# Patient Record
Sex: Male | Born: 1990
Health system: Southern US, Community
[De-identification: ages and names within clinical notes are randomized; demographics above are authoritative.]

## PROBLEM LIST (undated history)

## (undated) ENCOUNTER — Emergency Department (HOSPITAL_COMMUNITY): Admission: EM | Discharge status: Absent without leave | Payer: PRIVATE HEALTH INSURANCE

## (undated) DIAGNOSIS — E559 Vitamin D deficiency, unspecified: Secondary | ICD-10-CM

## (undated) DIAGNOSIS — D5700 Hb-SS disease with crisis, unspecified: Secondary | ICD-10-CM

## (undated) DIAGNOSIS — R809 Proteinuria, unspecified: Secondary | ICD-10-CM

## (undated) DIAGNOSIS — Z21 Asymptomatic human immunodeficiency virus [HIV] infection status: Secondary | ICD-10-CM

## (undated) DIAGNOSIS — F419 Anxiety disorder, unspecified: Secondary | ICD-10-CM

## (undated) DIAGNOSIS — D571 Sickle-cell disease without crisis: Secondary | ICD-10-CM

## (undated) DIAGNOSIS — B20 Human immunodeficiency virus [HIV] disease: Secondary | ICD-10-CM

## (undated) HISTORY — DX: Proteinuria, unspecified: R80.9

---
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
## 2009-03-17 ENCOUNTER — Emergency Department (HOSPITAL_COMMUNITY): Admission: EM | Admit: 2009-03-17 | Discharge: 2009-03-17 | Payer: Self-pay | Admitting: Emergency Medicine

## 2009-05-22 ENCOUNTER — Inpatient Hospital Stay (HOSPITAL_COMMUNITY): Admission: EM | Admit: 2009-05-22 | Discharge: 2009-06-07 | Payer: Self-pay | Admitting: Emergency Medicine

## 2010-05-29 DIAGNOSIS — D571 Sickle-cell disease without crisis: Secondary | ICD-10-CM

## 2010-07-10 LAB — CBC
HCT: 18.3 % — ABNORMAL LOW (ref 39.0–52.0)
HCT: 20 % — ABNORMAL LOW (ref 39.0–52.0)
HCT: 20.4 % — ABNORMAL LOW (ref 39.0–52.0)
HCT: 21.8 % — ABNORMAL LOW (ref 39.0–52.0)
HCT: 21.9 % — ABNORMAL LOW (ref 39.0–52.0)
HCT: 22.1 % — ABNORMAL LOW (ref 39.0–52.0)
HCT: 22.1 % — ABNORMAL LOW (ref 39.0–52.0)
HCT: 22.7 % — ABNORMAL LOW (ref 39.0–52.0)
HCT: 22.8 % — ABNORMAL LOW (ref 39.0–52.0)
HCT: 22.8 % — ABNORMAL LOW (ref 39.0–52.0)
HCT: 23.7 % — ABNORMAL LOW (ref 39.0–52.0)
HCT: 24.3 % — ABNORMAL LOW (ref 39.0–52.0)
HCT: 24.4 % — ABNORMAL LOW (ref 39.0–52.0)
HCT: 26.2 % — ABNORMAL LOW (ref 39.0–52.0)
HCT: 30.9 % — ABNORMAL LOW (ref 39.0–52.0)
Hemoglobin: 6.6 g/dL — CL (ref 13.0–17.0)
Hemoglobin: 6.6 g/dL — CL (ref 13.0–17.0)
Hemoglobin: 7.1 g/dL — ABNORMAL LOW (ref 13.0–17.0)
Hemoglobin: 7.1 g/dL — ABNORMAL LOW (ref 13.0–17.0)
Hemoglobin: 7.8 g/dL — ABNORMAL LOW (ref 13.0–17.0)
Hemoglobin: 7.9 g/dL — ABNORMAL LOW (ref 13.0–17.0)
Hemoglobin: 7.9 g/dL — ABNORMAL LOW (ref 13.0–17.0)
Hemoglobin: 7.9 g/dL — ABNORMAL LOW (ref 13.0–17.0)
Hemoglobin: 8.1 g/dL — ABNORMAL LOW (ref 13.0–17.0)
Hemoglobin: 8.1 g/dL — ABNORMAL LOW (ref 13.0–17.0)
Hemoglobin: 8.2 g/dL — ABNORMAL LOW (ref 13.0–17.0)
Hemoglobin: 8.2 g/dL — ABNORMAL LOW (ref 13.0–17.0)
Hemoglobin: 8.6 g/dL — ABNORMAL LOW (ref 13.0–17.0)
Hemoglobin: 8.8 g/dL — ABNORMAL LOW (ref 13.0–17.0)
Hemoglobin: 9.4 g/dL — ABNORMAL LOW (ref 13.0–17.0)
MCHC: 34.7 g/dL (ref 30.0–36.0)
MCHC: 35 g/dL (ref 30.0–36.0)
MCHC: 35.2 g/dL (ref 30.0–36.0)
MCHC: 35.3 g/dL (ref 30.0–36.0)
MCHC: 35.4 g/dL (ref 30.0–36.0)
MCHC: 35.6 g/dL (ref 30.0–36.0)
MCHC: 35.7 g/dL (ref 30.0–36.0)
MCHC: 35.7 g/dL (ref 30.0–36.0)
MCHC: 35.8 g/dL (ref 30.0–36.0)
MCHC: 35.8 g/dL (ref 30.0–36.0)
MCHC: 35.8 g/dL (ref 30.0–36.0)
MCHC: 35.9 g/dL (ref 30.0–36.0)
MCHC: 36 g/dL (ref 30.0–36.0)
MCHC: 36.2 g/dL — ABNORMAL HIGH (ref 30.0–36.0)
MCHC: 36.2 g/dL — ABNORMAL HIGH (ref 30.0–36.0)
MCV: 100.4 fL — ABNORMAL HIGH (ref 78.0–100.0)
MCV: 100.7 fL — ABNORMAL HIGH (ref 78.0–100.0)
MCV: 100.9 fL — ABNORMAL HIGH (ref 78.0–100.0)
MCV: 100.9 fL — ABNORMAL HIGH (ref 78.0–100.0)
MCV: 101.5 fL — ABNORMAL HIGH (ref 78.0–100.0)
MCV: 93.3 fL (ref 78.0–100.0)
MCV: 93.6 fL (ref 78.0–100.0)
MCV: 93.9 fL (ref 78.0–100.0)
MCV: 95.3 fL (ref 78.0–100.0)
MCV: 96.9 fL (ref 78.0–100.0)
MCV: 96.9 fL (ref 78.0–100.0)
MCV: 97.1 fL (ref 78.0–100.0)
MCV: 97.3 fL (ref 78.0–100.0)
MCV: 97.6 fL (ref 78.0–100.0)
MCV: 98.7 fL (ref 78.0–100.0)
Platelets: 295 10*3/uL (ref 150–400)
Platelets: 326 10*3/uL (ref 150–400)
Platelets: 351 10*3/uL (ref 150–400)
Platelets: 382 10*3/uL (ref 150–400)
Platelets: 411 10*3/uL — ABNORMAL HIGH (ref 150–400)
Platelets: 411 10*3/uL — ABNORMAL HIGH (ref 150–400)
Platelets: 426 10*3/uL — ABNORMAL HIGH (ref 150–400)
Platelets: 427 10*3/uL — ABNORMAL HIGH (ref 150–400)
Platelets: 433 10*3/uL — ABNORMAL HIGH (ref 150–400)
Platelets: 433 10*3/uL — ABNORMAL HIGH (ref 150–400)
Platelets: 436 10*3/uL — ABNORMAL HIGH (ref 150–400)
Platelets: 455 10*3/uL — ABNORMAL HIGH (ref 150–400)
Platelets: 460 10*3/uL — ABNORMAL HIGH (ref 150–400)
Platelets: 469 10*3/uL — ABNORMAL HIGH (ref 150–400)
Platelets: 488 10*3/uL — ABNORMAL HIGH (ref 150–400)
RBC: 1.81 MIL/uL — ABNORMAL LOW (ref 4.22–5.81)
RBC: 2.03 MIL/uL — ABNORMAL LOW (ref 4.22–5.81)
RBC: 2.05 MIL/uL — ABNORMAL LOW (ref 4.22–5.81)
RBC: 2.16 MIL/uL — ABNORMAL LOW (ref 4.22–5.81)
RBC: 2.25 MIL/uL — ABNORMAL LOW (ref 4.22–5.81)
RBC: 2.3 MIL/uL — ABNORMAL LOW (ref 4.22–5.81)
RBC: 2.31 MIL/uL — ABNORMAL LOW (ref 4.22–5.81)
RBC: 2.36 MIL/uL — ABNORMAL LOW (ref 4.22–5.81)
RBC: 2.37 MIL/uL — ABNORMAL LOW (ref 4.22–5.81)
RBC: 2.38 MIL/uL — ABNORMAL LOW (ref 4.22–5.81)
RBC: 2.43 MIL/uL — ABNORMAL LOW (ref 4.22–5.81)
RBC: 2.5 MIL/uL — ABNORMAL LOW (ref 4.22–5.81)
RBC: 2.51 MIL/uL — ABNORMAL LOW (ref 4.22–5.81)
RBC: 2.71 MIL/uL — ABNORMAL LOW (ref 4.22–5.81)
RBC: 3.19 MIL/uL — ABNORMAL LOW (ref 4.22–5.81)
RDW: 20.1 % — ABNORMAL HIGH (ref 11.5–15.5)
RDW: 20.2 % — ABNORMAL HIGH (ref 11.5–15.5)
RDW: 20.6 % — ABNORMAL HIGH (ref 11.5–15.5)
RDW: 21.6 % — ABNORMAL HIGH (ref 11.5–15.5)
RDW: 21.9 % — ABNORMAL HIGH (ref 11.5–15.5)
RDW: 22.2 % — ABNORMAL HIGH (ref 11.5–15.5)
RDW: 22.4 % — ABNORMAL HIGH (ref 11.5–15.5)
RDW: 22.9 % — ABNORMAL HIGH (ref 11.5–15.5)
RDW: 23.6 % — ABNORMAL HIGH (ref 11.5–15.5)
RDW: 25.1 % — ABNORMAL HIGH (ref 11.5–15.5)
RDW: 26.1 % — ABNORMAL HIGH (ref 11.5–15.5)
RDW: 27.5 % — ABNORMAL HIGH (ref 11.5–15.5)
RDW: 27.7 % — ABNORMAL HIGH (ref 11.5–15.5)
RDW: 28.5 % — ABNORMAL HIGH (ref 11.5–15.5)
RDW: 29.4 % — ABNORMAL HIGH (ref 11.5–15.5)
WBC: 11.6 10*3/uL — ABNORMAL HIGH (ref 4.0–10.5)
WBC: 12 10*3/uL — ABNORMAL HIGH (ref 4.0–10.5)
WBC: 12.1 10*3/uL — ABNORMAL HIGH (ref 4.0–10.5)
WBC: 12.6 10*3/uL — ABNORMAL HIGH (ref 4.0–10.5)
WBC: 12.8 10*3/uL — ABNORMAL HIGH (ref 4.0–10.5)
WBC: 12.9 10*3/uL — ABNORMAL HIGH (ref 4.0–10.5)
WBC: 13.1 10*3/uL — ABNORMAL HIGH (ref 4.0–10.5)
WBC: 13.4 10*3/uL — ABNORMAL HIGH (ref 4.0–10.5)
WBC: 14.3 10*3/uL — ABNORMAL HIGH (ref 4.0–10.5)
WBC: 15 10*3/uL — ABNORMAL HIGH (ref 4.0–10.5)
WBC: 15.6 10*3/uL — ABNORMAL HIGH (ref 4.0–10.5)
WBC: 16.2 10*3/uL — ABNORMAL HIGH (ref 4.0–10.5)
WBC: 16.3 10*3/uL — ABNORMAL HIGH (ref 4.0–10.5)
WBC: 16.6 10*3/uL — ABNORMAL HIGH (ref 4.0–10.5)
WBC: 17.6 10*3/uL — ABNORMAL HIGH (ref 4.0–10.5)

## 2010-07-10 LAB — TYPE AND SCREEN
ABO/RH(D): O POS
ABO/RH(D): O POS
Antibody Screen: NEGATIVE
Antibody Screen: NEGATIVE

## 2010-07-10 LAB — RETICULOCYTES
RBC.: 2.05 MIL/uL — ABNORMAL LOW (ref 4.22–5.81)
RBC.: 2.05 MIL/uL — ABNORMAL LOW (ref 4.22–5.81)
RBC.: 2.12 MIL/uL — ABNORMAL LOW (ref 4.22–5.81)
RBC.: 2.19 MIL/uL — ABNORMAL LOW (ref 4.22–5.81)
RBC.: 2.41 MIL/uL — ABNORMAL LOW (ref 4.22–5.81)
RBC.: 2.42 MIL/uL — ABNORMAL LOW (ref 4.22–5.81)
Retic Count, Absolute: 180.2 10*3/uL (ref 19.0–186.0)
Retic Count, Absolute: 205.7 10*3/uL — ABNORMAL HIGH (ref 19.0–186.0)
Retic Count, Absolute: 207.1 10*3/uL — ABNORMAL HIGH (ref 19.0–186.0)
Retic Count, Absolute: 231.7 10*3/uL — ABNORMAL HIGH (ref 19.0–186.0)
Retic Count, Absolute: 238.6 10*3/uL — ABNORMAL HIGH (ref 19.0–186.0)
Retic Count, Absolute: 433.6 10*3/uL — ABNORMAL HIGH (ref 19.0–186.0)
Retic Ct Pct: 10.1 % — ABNORMAL HIGH (ref 0.4–3.1)
Retic Ct Pct: 11.3 % — ABNORMAL HIGH (ref 0.4–3.1)
Retic Ct Pct: 19.8 % — ABNORMAL HIGH (ref 0.4–3.1)
Retic Ct Pct: 8.5 % — ABNORMAL HIGH (ref 0.4–3.1)
Retic Ct Pct: 8.5 % — ABNORMAL HIGH (ref 0.4–3.1)
Retic Ct Pct: 9.9 % — ABNORMAL HIGH (ref 0.4–3.1)

## 2010-07-10 LAB — COMPREHENSIVE METABOLIC PANEL
ALT: 11 U/L (ref 0–53)
ALT: 12 U/L (ref 0–53)
ALT: 13 U/L (ref 0–53)
ALT: 14 U/L (ref 0–53)
ALT: 14 U/L (ref 0–53)
ALT: 15 U/L (ref 0–53)
ALT: 15 U/L (ref 0–53)
ALT: 17 U/L (ref 0–53)
ALT: 18 U/L (ref 0–53)
ALT: 21 U/L (ref 0–53)
AST: 31 U/L (ref 0–37)
AST: 31 U/L (ref 0–37)
AST: 33 U/L (ref 0–37)
AST: 34 U/L (ref 0–37)
AST: 35 U/L (ref 0–37)
AST: 40 U/L — ABNORMAL HIGH (ref 0–37)
AST: 42 U/L — ABNORMAL HIGH (ref 0–37)
AST: 42 U/L — ABNORMAL HIGH (ref 0–37)
AST: 42 U/L — ABNORMAL HIGH (ref 0–37)
AST: 48 U/L — ABNORMAL HIGH (ref 0–37)
Albumin: 3.2 g/dL — ABNORMAL LOW (ref 3.5–5.2)
Albumin: 3.5 g/dL (ref 3.5–5.2)
Albumin: 3.5 g/dL (ref 3.5–5.2)
Albumin: 3.5 g/dL (ref 3.5–5.2)
Albumin: 3.6 g/dL (ref 3.5–5.2)
Albumin: 3.6 g/dL (ref 3.5–5.2)
Albumin: 3.7 g/dL (ref 3.5–5.2)
Albumin: 3.8 g/dL (ref 3.5–5.2)
Albumin: 3.8 g/dL (ref 3.5–5.2)
Albumin: 3.9 g/dL (ref 3.5–5.2)
Alkaline Phosphatase: 46 U/L (ref 39–117)
Alkaline Phosphatase: 48 U/L (ref 39–117)
Alkaline Phosphatase: 48 U/L (ref 39–117)
Alkaline Phosphatase: 49 U/L (ref 39–117)
Alkaline Phosphatase: 50 U/L (ref 39–117)
Alkaline Phosphatase: 50 U/L (ref 39–117)
Alkaline Phosphatase: 51 U/L (ref 39–117)
Alkaline Phosphatase: 51 U/L (ref 39–117)
Alkaline Phosphatase: 52 U/L (ref 39–117)
Alkaline Phosphatase: 53 U/L (ref 39–117)
BUN: 10 mg/dL (ref 6–23)
BUN: 10 mg/dL (ref 6–23)
BUN: 12 mg/dL (ref 6–23)
BUN: 12 mg/dL (ref 6–23)
BUN: 15 mg/dL (ref 6–23)
BUN: 4 mg/dL — ABNORMAL LOW (ref 6–23)
BUN: 5 mg/dL — ABNORMAL LOW (ref 6–23)
BUN: 7 mg/dL (ref 6–23)
BUN: 7 mg/dL (ref 6–23)
BUN: 9 mg/dL (ref 6–23)
CO2: 24 mEq/L (ref 19–32)
CO2: 25 mEq/L (ref 19–32)
CO2: 27 mEq/L (ref 19–32)
CO2: 28 mEq/L (ref 19–32)
CO2: 28 mEq/L (ref 19–32)
CO2: 29 mEq/L (ref 19–32)
CO2: 29 mEq/L (ref 19–32)
CO2: 30 mEq/L (ref 19–32)
CO2: 30 mEq/L (ref 19–32)
CO2: 30 mEq/L (ref 19–32)
Calcium: 8.4 mg/dL (ref 8.4–10.5)
Calcium: 8.8 mg/dL (ref 8.4–10.5)
Calcium: 8.9 mg/dL (ref 8.4–10.5)
Calcium: 8.9 mg/dL (ref 8.4–10.5)
Calcium: 8.9 mg/dL (ref 8.4–10.5)
Calcium: 9.2 mg/dL (ref 8.4–10.5)
Calcium: 9.3 mg/dL (ref 8.4–10.5)
Calcium: 9.4 mg/dL (ref 8.4–10.5)
Calcium: 9.6 mg/dL (ref 8.4–10.5)
Calcium: 9.6 mg/dL (ref 8.4–10.5)
Chloride: 101 mEq/L (ref 96–112)
Chloride: 102 mEq/L (ref 96–112)
Chloride: 102 mEq/L (ref 96–112)
Chloride: 103 mEq/L (ref 96–112)
Chloride: 103 mEq/L (ref 96–112)
Chloride: 103 mEq/L (ref 96–112)
Chloride: 104 mEq/L (ref 96–112)
Chloride: 105 mEq/L (ref 96–112)
Chloride: 105 mEq/L (ref 96–112)
Chloride: 99 mEq/L (ref 96–112)
Creatinine, Ser: 0.58 mg/dL (ref 0.4–1.5)
Creatinine, Ser: 0.62 mg/dL (ref 0.4–1.5)
Creatinine, Ser: 0.66 mg/dL (ref 0.4–1.5)
Creatinine, Ser: 0.68 mg/dL (ref 0.4–1.5)
Creatinine, Ser: 0.68 mg/dL (ref 0.4–1.5)
Creatinine, Ser: 0.69 mg/dL (ref 0.4–1.5)
Creatinine, Ser: 0.71 mg/dL (ref 0.4–1.5)
Creatinine, Ser: 0.71 mg/dL (ref 0.4–1.5)
Creatinine, Ser: 0.8 mg/dL (ref 0.4–1.5)
Creatinine, Ser: 0.83 mg/dL (ref 0.4–1.5)
GFR calc Af Amer: >60 mL/min (ref >60)
GFR calc Af Amer: >60 mL/min (ref >60)
GFR calc Af Amer: >60 mL/min (ref >60)
GFR calc Af Amer: >60 mL/min (ref >60)
GFR calc Af Amer: >60 mL/min (ref >60)
GFR calc Af Amer: >60 mL/min (ref >60)
GFR calc Af Amer: >60 mL/min (ref >60)
GFR calc Af Amer: >60 mL/min (ref >60)
GFR calc Af Amer: >60 mL/min (ref >60)
GFR calc Af Amer: >60 mL/min (ref >60)
GFR calc non Af Amer: >60 mL/min (ref >60)
GFR calc non Af Amer: >60 mL/min (ref >60)
GFR calc non Af Amer: >60 mL/min (ref >60)
GFR calc non Af Amer: >60 mL/min (ref >60)
GFR calc non Af Amer: >60 mL/min (ref >60)
GFR calc non Af Amer: >60 mL/min (ref >60)
GFR calc non Af Amer: >60 mL/min (ref >60)
GFR calc non Af Amer: >60 mL/min (ref >60)
GFR calc non Af Amer: >60 mL/min (ref >60)
GFR calc non Af Amer: >60 mL/min (ref >60)
Glucose, Bld: 101 mg/dL — ABNORMAL HIGH (ref 70–99)
Glucose, Bld: 104 mg/dL — ABNORMAL HIGH (ref 70–99)
Glucose, Bld: 104 mg/dL — ABNORMAL HIGH (ref 70–99)
Glucose, Bld: 88 mg/dL (ref 70–99)
Glucose, Bld: 88 mg/dL (ref 70–99)
Glucose, Bld: 91 mg/dL (ref 70–99)
Glucose, Bld: 92 mg/dL (ref 70–99)
Glucose, Bld: 93 mg/dL (ref 70–99)
Glucose, Bld: 93 mg/dL (ref 70–99)
Glucose, Bld: 95 mg/dL (ref 70–99)
Potassium: 3.6 mEq/L (ref 3.5–5.1)
Potassium: 3.9 mEq/L (ref 3.5–5.1)
Potassium: 3.9 mEq/L (ref 3.5–5.1)
Potassium: 3.9 mEq/L (ref 3.5–5.1)
Potassium: 3.9 mEq/L (ref 3.5–5.1)
Potassium: 4 mEq/L (ref 3.5–5.1)
Potassium: 4.1 mEq/L (ref 3.5–5.1)
Potassium: 4.1 mEq/L (ref 3.5–5.1)
Potassium: 4.2 mEq/L (ref 3.5–5.1)
Potassium: 4.5 mEq/L (ref 3.5–5.1)
Sodium: 134 mEq/L — ABNORMAL LOW (ref 135–145)
Sodium: 136 mEq/L (ref 135–145)
Sodium: 136 mEq/L (ref 135–145)
Sodium: 136 mEq/L (ref 135–145)
Sodium: 137 mEq/L (ref 135–145)
Sodium: 137 mEq/L (ref 135–145)
Sodium: 137 mEq/L (ref 135–145)
Sodium: 137 mEq/L (ref 135–145)
Sodium: 138 mEq/L (ref 135–145)
Sodium: 139 mEq/L (ref 135–145)
Total Bilirubin: 4.9 mg/dL — ABNORMAL HIGH (ref 0.3–1.2)
Total Bilirubin: 5 mg/dL — ABNORMAL HIGH (ref 0.3–1.2)
Total Bilirubin: 5.7 mg/dL — ABNORMAL HIGH (ref 0.3–1.2)
Total Bilirubin: 5.9 mg/dL — ABNORMAL HIGH (ref 0.3–1.2)
Total Bilirubin: 6 mg/dL — ABNORMAL HIGH (ref 0.3–1.2)
Total Bilirubin: 6 mg/dL — ABNORMAL HIGH (ref 0.3–1.2)
Total Bilirubin: 6.4 mg/dL — ABNORMAL HIGH (ref 0.3–1.2)
Total Bilirubin: 6.6 mg/dL — ABNORMAL HIGH (ref 0.3–1.2)
Total Bilirubin: 8.8 mg/dL — ABNORMAL HIGH (ref 0.3–1.2)
Total Bilirubin: 9.7 mg/dL — ABNORMAL HIGH (ref 0.3–1.2)
Total Protein: 6.7 g/dL (ref 6.0–8.3)
Total Protein: 7.1 g/dL (ref 6.0–8.3)
Total Protein: 7.2 g/dL (ref 6.0–8.3)
Total Protein: 7.3 g/dL (ref 6.0–8.3)
Total Protein: 7.6 g/dL (ref 6.0–8.3)
Total Protein: 7.7 g/dL (ref 6.0–8.3)
Total Protein: 7.8 g/dL (ref 6.0–8.3)
Total Protein: 8.1 g/dL (ref 6.0–8.3)
Total Protein: 8.1 g/dL (ref 6.0–8.3)
Total Protein: 8.2 g/dL (ref 6.0–8.3)

## 2010-07-10 LAB — DIFFERENTIAL
Basophils Absolute: 0.1 10*3/uL (ref 0.0–0.1)
Basophils Absolute: 0.2 10*3/uL — ABNORMAL HIGH (ref 0.0–0.1)
Basophils Absolute: 0.3 10*3/uL — ABNORMAL HIGH (ref 0.0–0.1)
Basophils Relative: 1 % (ref 0–1)
Basophils Relative: 1 % (ref 0–1)
Basophils Relative: 2 % — ABNORMAL HIGH (ref 0–1)
Eosinophils Absolute: 0 10*3/uL (ref 0.0–0.7)
Eosinophils Absolute: 0.3 10*3/uL (ref 0.0–0.7)
Eosinophils Absolute: 0.4 10*3/uL (ref 0.0–0.7)
Eosinophils Relative: 0 % (ref 0–5)
Eosinophils Relative: 2 % (ref 0–5)
Eosinophils Relative: 3 % (ref 0–5)
Lymphocytes Relative: 42 % (ref 12–46)
Lymphocytes Relative: 44 % (ref 12–46)
Lymphocytes Relative: 45 % (ref 12–46)
Lymphs Abs: 5.7 10*3/uL — ABNORMAL HIGH (ref 0.7–4.0)
Lymphs Abs: 7.4 10*3/uL — ABNORMAL HIGH (ref 0.7–4.0)
Lymphs Abs: 7.4 10*3/uL — ABNORMAL HIGH (ref 0.7–4.0)
Monocytes Absolute: 1.9 10*3/uL — ABNORMAL HIGH (ref 0.1–1.0)
Monocytes Absolute: 1.9 10*3/uL — ABNORMAL HIGH (ref 0.1–1.0)
Monocytes Absolute: 1.9 10*3/uL — ABNORMAL HIGH (ref 0.1–1.0)
Monocytes Relative: 11 % (ref 3–12)
Monocytes Relative: 12 % (ref 3–12)
Monocytes Relative: 15 % — ABNORMAL HIGH (ref 3–12)
Neutro Abs: 4.7 10*3/uL (ref 1.7–7.7)
Neutro Abs: 6.3 10*3/uL (ref 1.7–7.7)
Neutro Abs: 8.1 10*3/uL — ABNORMAL HIGH (ref 1.7–7.7)
Neutrophils Relative %: 37 % — ABNORMAL LOW (ref 43–77)
Neutrophils Relative %: 39 % — ABNORMAL LOW (ref 43–77)
Neutrophils Relative %: 46 % (ref 43–77)

## 2010-07-10 LAB — CROSSMATCH
ABO/RH(D): O POS
Antibody Screen: NEGATIVE

## 2010-07-10 LAB — PATHOLOGIST SMEAR REVIEW

## 2010-07-10 LAB — HEMOGLOBIN
Hemoglobin: 7.4 g/dL — ABNORMAL LOW (ref 13.0–17.0)
Hemoglobin: 7.7 g/dL — ABNORMAL LOW (ref 13.0–17.0)

## 2010-07-10 LAB — HEPATIC FUNCTION PANEL
ALT: 17 U/L (ref 0–53)
ALT: 23 U/L (ref 0–53)
ALT: 26 U/L (ref 0–53)
AST: 42 U/L — ABNORMAL HIGH (ref 0–37)
AST: 45 U/L — ABNORMAL HIGH (ref 0–37)
AST: 48 U/L — ABNORMAL HIGH (ref 0–37)
Albumin: 3.4 g/dL — ABNORMAL LOW (ref 3.5–5.2)
Albumin: 3.6 g/dL (ref 3.5–5.2)
Albumin: 3.6 g/dL (ref 3.5–5.2)
Alkaline Phosphatase: 43 U/L (ref 39–117)
Alkaline Phosphatase: 51 U/L (ref 39–117)
Alkaline Phosphatase: 52 U/L (ref 39–117)
Bilirubin, Direct: 0.2 mg/dL (ref 0.0–0.3)
Bilirubin, Direct: 0.3 mg/dL (ref 0.0–0.3)
Bilirubin, Direct: 0.3 mg/dL (ref 0.0–0.3)
Indirect Bilirubin: 4.3 mg/dL — ABNORMAL HIGH (ref 0.3–0.9)
Indirect Bilirubin: 5 mg/dL — ABNORMAL HIGH (ref 0.3–0.9)
Indirect Bilirubin: 6.2 mg/dL — ABNORMAL HIGH (ref 0.3–0.9)
Total Bilirubin: 4.5 mg/dL — ABNORMAL HIGH (ref 0.3–1.2)
Total Bilirubin: 5.3 mg/dL — ABNORMAL HIGH (ref 0.3–1.2)
Total Bilirubin: 6.5 mg/dL — ABNORMAL HIGH (ref 0.3–1.2)
Total Protein: 7.6 g/dL (ref 6.0–8.3)
Total Protein: 7.8 g/dL (ref 6.0–8.3)
Total Protein: 7.8 g/dL (ref 6.0–8.3)

## 2010-07-10 LAB — HEPATITIS PANEL, ACUTE
HCV Ab: NEGATIVE
Hep A IgM: NEGATIVE
Hep B C IgM: NEGATIVE
Hepatitis B Surface Ag: NEGATIVE

## 2010-07-10 LAB — BASIC METABOLIC PANEL
BUN: 5 mg/dL — ABNORMAL LOW (ref 6–23)
CO2: 28 mEq/L (ref 19–32)
Calcium: 8.6 mg/dL (ref 8.4–10.5)
Chloride: 106 mEq/L (ref 96–112)
Creatinine, Ser: 0.64 mg/dL (ref 0.4–1.5)
GFR calc Af Amer: >60 mL/min (ref >60)
GFR calc non Af Amer: >60 mL/min (ref >60)
Glucose, Bld: 90 mg/dL (ref 70–99)
Potassium: 4 mEq/L (ref 3.5–5.1)
Sodium: 139 mEq/L (ref 135–145)

## 2010-07-10 LAB — URINALYSIS, ROUTINE W REFLEX MICROSCOPIC
Glucose, UA: NEGATIVE mg/dL
Hgb urine dipstick: NEGATIVE
Ketones, ur: NEGATIVE mg/dL
Nitrite: NEGATIVE
Protein, ur: NEGATIVE mg/dL
Specific Gravity, Urine: 1.013 (ref 1.005–1.030)
Urobilinogen, UA: 4 mg/dL — ABNORMAL HIGH (ref 0.0–1.0)
pH: 5.5 (ref 5.0–8.0)

## 2010-07-10 LAB — URINE CULTURE
Colony Count: NO GROWTH
Culture: NO GROWTH

## 2010-07-10 LAB — IRON: Iron: 145 ug/dL — ABNORMAL HIGH (ref 42–135)

## 2010-07-10 LAB — BILIRUBIN, FRACTIONATED(TOT/DIR/INDIR)
Bilirubin, Direct: 0.4 mg/dL — ABNORMAL HIGH (ref 0.0–0.3)
Indirect Bilirubin: 8.2 mg/dL — ABNORMAL HIGH (ref 0.3–0.9)
Total Bilirubin: 8.6 mg/dL — ABNORMAL HIGH (ref 0.3–1.2)

## 2010-07-10 LAB — RPR: RPR Ser Ql: NONREACTIVE

## 2010-07-10 LAB — URINE MICROSCOPIC-ADD ON

## 2010-07-10 LAB — GLUCOSE, CAPILLARY
Glucose-Capillary: 91 mg/dL (ref 70–99)
Glucose-Capillary: 93 mg/dL (ref 70–99)

## 2010-07-10 LAB — PREPARE RBC (CROSSMATCH)

## 2010-07-10 LAB — ABO/RH: ABO/RH(D): O POS

## 2010-07-10 LAB — FOLATE RBC: RBC Folate: 704 ng/mL — ABNORMAL HIGH (ref 180–600)

## 2010-07-10 LAB — HAPTOGLOBIN: Haptoglobin: <6 mg/dL — ABNORMAL LOW (ref 16–200)

## 2010-07-10 LAB — HIV ANTIBODY (ROUTINE TESTING W REFLEX): HIV: NONREACTIVE

## 2010-07-10 LAB — HEMOGLOBIN A1C: Hgb A1c MFr Bld: <4 % — ABNORMAL LOW (ref 4.6–6.1)

## 2010-07-10 LAB — VITAMIN B12: Vitamin B-12: 331 pg/mL (ref 211–911)

## 2010-07-10 LAB — LACTATE DEHYDROGENASE: LDH: 645 U/L — ABNORMAL HIGH (ref 94–250)

## 2012-05-23 DIAGNOSIS — Z21 Asymptomatic human immunodeficiency virus [HIV] infection status: Secondary | ICD-10-CM | POA: Diagnosis present

## 2012-05-23 DIAGNOSIS — B20 Human immunodeficiency virus [HIV] disease: Secondary | ICD-10-CM | POA: Diagnosis present

## 2012-11-07 DIAGNOSIS — F1290 Cannabis use, unspecified, uncomplicated: Secondary | ICD-10-CM | POA: Insufficient documentation

## 2014-05-19 DIAGNOSIS — K219 Gastro-esophageal reflux disease without esophagitis: Secondary | ICD-10-CM | POA: Diagnosis present

## 2014-05-19 DIAGNOSIS — F411 Generalized anxiety disorder: Secondary | ICD-10-CM | POA: Diagnosis present

## 2014-09-27 DIAGNOSIS — Z79899 Other long term (current) drug therapy: Secondary | ICD-10-CM | POA: Insufficient documentation

## 2016-02-12 DIAGNOSIS — D5700 Hb-SS disease with crisis, unspecified: Secondary | ICD-10-CM | POA: Diagnosis present

## 2016-08-14 DIAGNOSIS — E559 Vitamin D deficiency, unspecified: Secondary | ICD-10-CM | POA: Insufficient documentation

## 2016-12-16 ENCOUNTER — Emergency Department (HOSPITAL_COMMUNITY)
Admission: EM | Admit: 2016-12-16 | Discharge: 2016-12-16 | Disposition: A | Discharge status: Home / Self Care | Payer: Medicaid - Out of State | Attending: Emergency Medicine | Admitting: Emergency Medicine

## 2016-12-16 ENCOUNTER — Encounter (HOSPITAL_COMMUNITY): Payer: Self-pay | Admitting: Emergency Medicine

## 2016-12-16 DIAGNOSIS — D5700 Hb-SS disease with crisis, unspecified: Secondary | ICD-10-CM | POA: Diagnosis not present

## 2016-12-16 DIAGNOSIS — R52 Pain, unspecified: Secondary | ICD-10-CM | POA: Diagnosis present

## 2016-12-16 HISTORY — DX: Hb-SS disease with crisis, unspecified: D57.00

## 2016-12-16 LAB — COMPREHENSIVE METABOLIC PANEL
ALT: 73 U/L — ABNORMAL HIGH (ref 17–63)
AST: 70 U/L — ABNORMAL HIGH (ref 15–41)
Albumin: 3.7 g/dL (ref 3.5–5.0)
Alkaline Phosphatase: 58 U/L (ref 38–126)
Anion gap: 8 (ref 5–15)
BUN: 6 mg/dL (ref 6–20)
CO2: 22 mmol/L (ref 22–32)
Calcium: 8.7 mg/dL — ABNORMAL LOW (ref 8.9–10.3)
Chloride: 106 mmol/L (ref 101–111)
Creatinine, Ser: 0.78 mg/dL (ref 0.61–1.24)
GFR calc Af Amer: >60 mL/min (ref >60)
GFR calc non Af Amer: >60 mL/min (ref >60)
Glucose, Bld: 95 mg/dL (ref 65–99)
Potassium: 4 mmol/L (ref 3.5–5.1)
Sodium: 136 mmol/L (ref 135–145)
Total Bilirubin: 7.8 mg/dL — ABNORMAL HIGH (ref 0.3–1.2)
Total Protein: 8.2 g/dL — ABNORMAL HIGH (ref 6.5–8.1)

## 2016-12-16 LAB — RETICULOCYTES
RBC.: 2.19 MIL/uL — ABNORMAL LOW (ref 4.22–5.81)
Retic Count, Absolute: 350.4 10*3/uL — ABNORMAL HIGH (ref 19.0–186.0)
Retic Ct Pct: 16 % — ABNORMAL HIGH (ref 0.4–3.1)

## 2016-12-16 LAB — CBC WITH DIFFERENTIAL/PLATELET
Basophils Absolute: 0 10*3/uL (ref 0.0–0.1)
Basophils Relative: 0 %
Eosinophils Absolute: 0 10*3/uL (ref 0.0–0.7)
Eosinophils Relative: 0 %
HCT: 26 % — ABNORMAL LOW (ref 39.0–52.0)
Hemoglobin: 9.3 g/dL — ABNORMAL LOW (ref 13.0–17.0)
Lymphocytes Relative: 52 %
Lymphs Abs: 4.2 10*3/uL — ABNORMAL HIGH (ref 0.7–4.0)
MCH: 42.5 pg — ABNORMAL HIGH (ref 26.0–34.0)
MCHC: 35.8 g/dL (ref 30.0–36.0)
MCV: 118.7 fL — ABNORMAL HIGH (ref 78.0–100.0)
Monocytes Absolute: 0.7 10*3/uL (ref 0.1–1.0)
Monocytes Relative: 8 %
Neutro Abs: 3.3 10*3/uL (ref 1.7–7.7)
Neutrophils Relative %: 40 %
Platelets: 262 10*3/uL (ref 150–400)
RBC: 2.19 MIL/uL — ABNORMAL LOW (ref 4.22–5.81)
RDW: 22.2 % — ABNORMAL HIGH (ref 11.5–15.5)
WBC: 8.2 10*3/uL (ref 4.0–10.5)

## 2016-12-16 MED ORDER — DEXTROSE-NACL 5-0.45 % IV SOLN
INTRAVENOUS | Status: DC
Start: 2016-12-16 — End: 2016-12-16
  Administered 2016-12-16: 04:00:00 via INTRAVENOUS

## 2016-12-16 MED ORDER — HYDROMORPHONE HCL 1 MG/ML IJ SOLN
0.5000 mg | Freq: Once | INTRAMUSCULAR | Status: AC
  Administered 2016-12-16: 0.5 mg via SUBCUTANEOUS
  Filled 2016-12-16: qty 1

## 2016-12-16 MED ORDER — MORPHINE SULFATE (PF) 4 MG/ML IV SOLN
6.0000 mg | INTRAVENOUS | Status: DC | PRN
  Administered 2016-12-16 (×2): 6 mg via INTRAVENOUS
  Filled 2016-12-16 (×2): qty 2

## 2016-12-16 MED ORDER — DIPHENHYDRAMINE HCL 25 MG PO CAPS
50.0000 mg | ORAL_CAPSULE | Freq: Once | ORAL | Status: AC
  Administered 2016-12-16: 50 mg via ORAL
  Filled 2016-12-16: qty 2

## 2016-12-16 NOTE — ED Provider Notes (Signed)
[facility]-EMERGENCY DEPT Provider Note   CSN: [id number] Arrival date & time: 12/16/16  0039     History   Chief Complaint Chief Complaint  Patient presents with  . Sickle Cell Pain Crisis    HPI Martin [name] is a 26 y.o. male with a past medical history of sickle cell disease presenting today with diffuse body pain. Patient states this started this evening. He has been compliant with his home oxycodone. He denies any fevers, or cough. He normally goes to [facility] in [place], but he is here visiting family.  He states he normally receives Dilaudid and Benadryl to relieve his pain. He is allergic to Toradol. There are no further complaints.  10 Systems reviewed and are negative for acute change except as noted in the HPI.   HPI  Past Medical History:  Diagnosis Date  . Sickle cell crisis (HCC)     There are no active problems to display for this patient.   History reviewed. No pertinent surgical history.     Home Medications    Prior to Admission medications   Not on File    Family History No family history on file.  Social History Social History  Substance Use Topics  . Smoking status: Never Smoker  . Smokeless tobacco: Never Used  . Alcohol use No     Allergies   Patient has no known allergies.   Review of Systems Review of Systems   Physical Exam Updated Vital Signs BP 102/70 (BP Location: Right Arm)   Pulse 85   Temp 98.6 F (37 C) (Oral)   Resp 16   SpO2 96%   Physical Exam  Constitutional: He is oriented to person, place, and time. Vital signs are normal. He appears well-developed and well-nourished.  Non-toxic appearance. He does not appear ill. No distress.  HENT:  Head: Normocephalic and atraumatic.  Nose: Nose normal.  Mouth/Throat: Oropharynx is clear and moist. No oropharyngeal exudate.  Eyes: Pupils are equal, round, and reactive to light. Conjunctivae and EOM are normal. No scleral icterus.  Neck: Normal range of  motion. Neck supple. No tracheal deviation, no edema, no erythema and normal range of motion present. No thyroid mass and no thyromegaly present.  Cardiovascular: Normal rate, regular rhythm, S1 normal, S2 normal, normal heart sounds, intact distal pulses and normal pulses.  Exam reveals no gallop and no friction rub.   No murmur heard. Pulmonary/Chest: Effort normal and breath sounds normal. No respiratory distress. He has no wheezes. He has no rhonchi. He has no rales.  Abdominal: Soft. Normal appearance and bowel sounds are normal. He exhibits no distension, no ascites and no mass. There is no hepatosplenomegaly. There is no tenderness. There is no rebound, no guarding and no CVA tenderness.  Musculoskeletal: Normal range of motion. He exhibits no edema or tenderness.  Lymphadenopathy:    He has no cervical adenopathy.  Neurological: He is alert and oriented to person, place, and time. He has normal strength. No cranial nerve deficit or sensory deficit.  Skin: Skin is warm, dry and intact. No petechiae and no rash noted. He is not diaphoretic. No erythema. No pallor.  Nursing note and vitals reviewed.    ED Treatments / Results  Labs (all labs ordered are listed, but only abnormal results are displayed) Labs Reviewed  COMPREHENSIVE METABOLIC PANEL - Abnormal; Notable for the following:       Result Value   Calcium 8.7 (*)    Total Protein 8.2 (*)  AST 70 (*)    ALT 73 (*)    Total Bilirubin 7.8 (*)    All other components within normal limits  CBC WITH DIFFERENTIAL/PLATELET - Abnormal; Notable for the following:    RBC 2.19 (*)    Hemoglobin 9.3 (*)    HCT 26.0 (*)    MCV 118.7 (*)    MCH 42.5 (*)    RDW 22.2 (*)    Lymphs Abs 4.2 (*)    All other components within normal limits  RETICULOCYTES - Abnormal; Notable for the following:    Retic Ct Pct 16.0 (*)    RBC. 2.19 (*)    Retic Count, Absolute 350.4 (*)    All other components within normal limits    EKG  EKG  Interpretation None       Radiology No results found.  Procedures Procedures (including critical care time)  Medications Ordered in ED Medications  morphine 4 MG/ML injection 6 mg (not administered)  dextrose 5 %-0.45 % sodium chloride infusion (not administered)  diphenhydrAMINE (BENADRYL) capsule 50 mg (not administered)  HYDROmorphone (DILAUDID) injection 0.5 mg (0.5 mg Subcutaneous Given 12/16/16 0103)     Initial Impression / Assessment and Plan / ED Course  I have reviewed the triage vital signs and the nursing notes.  Pertinent labs & imaging results that were available during my care of the patient were reviewed by me and considered in my medical decision making (see chart for details).     Patient presents emergency department for sickle cell crisis. He was given IV fluids, morphine, oral Benadryl for his symptoms. We'll continue to redose as needed and reassess his pain.  5:56 AM Patient states he now feels much better.  He received 2 total doses of morphine 6mg .  He appears well and in NAD.  Labs are at his normal baseline. VS remain within his normal limits and he is safe for DC.  Final Clinical Impressions(s) / ED Diagnoses   Final diagnoses:  None    New Prescriptions New Prescriptions   No medications on file     Tomasita Crumble, MD 12/16/16 [phone]

## 2016-12-16 NOTE — ED Triage Notes (Signed)
Patient reports sickle cell pain crisis with generalized body aches onset today , denies emesis or fever .

## 2016-12-18 ENCOUNTER — Emergency Department (HOSPITAL_COMMUNITY)
Admission: EM | Admit: 2016-12-18 | Discharge: 2016-12-18 | Disposition: A | Discharge status: Home / Self Care | Payer: Medicaid - Out of State | Attending: Emergency Medicine | Admitting: Emergency Medicine

## 2016-12-18 ENCOUNTER — Encounter (HOSPITAL_COMMUNITY): Payer: Self-pay

## 2016-12-18 ENCOUNTER — Emergency Department (HOSPITAL_COMMUNITY): Payer: Medicaid - Out of State

## 2016-12-18 DIAGNOSIS — Z79899 Other long term (current) drug therapy: Secondary | ICD-10-CM | POA: Diagnosis not present

## 2016-12-18 DIAGNOSIS — D5700 Hb-SS disease with crisis, unspecified: Secondary | ICD-10-CM | POA: Diagnosis not present

## 2016-12-18 LAB — CBC WITH DIFFERENTIAL/PLATELET
Basophils Absolute: 0 10*3/uL (ref 0.0–0.1)
Basophils Relative: 0 %
Eosinophils Absolute: 0 10*3/uL (ref 0.0–0.7)
Eosinophils Relative: 0 %
HCT: 24.8 % — ABNORMAL LOW (ref 39.0–52.0)
Hemoglobin: 9.1 g/dL — ABNORMAL LOW (ref 13.0–17.0)
Lymphocytes Relative: 23 %
Lymphs Abs: 2.7 10*3/uL (ref 0.7–4.0)
MCH: 43.3 pg — ABNORMAL HIGH (ref 26.0–34.0)
MCHC: 36.7 g/dL — ABNORMAL HIGH (ref 30.0–36.0)
MCV: 118.1 fL — ABNORMAL HIGH (ref 78.0–100.0)
Monocytes Absolute: 0.8 10*3/uL (ref 0.1–1.0)
Monocytes Relative: 7 %
Neutro Abs: 8.3 10*3/uL — ABNORMAL HIGH (ref 1.7–7.7)
Neutrophils Relative %: 70 %
Platelets: 299 10*3/uL (ref 150–400)
RBC: 2.1 MIL/uL — ABNORMAL LOW (ref 4.22–5.81)
RDW: 23.5 % — ABNORMAL HIGH (ref 11.5–15.5)
WBC: 11.8 10*3/uL — ABNORMAL HIGH (ref 4.0–10.5)
nRBC: 13 /100 WBC — ABNORMAL HIGH

## 2016-12-18 LAB — COMPREHENSIVE METABOLIC PANEL
ALT: 64 U/L — ABNORMAL HIGH (ref 17–63)
AST: 126 U/L — ABNORMAL HIGH (ref 15–41)
Albumin: 4.1 g/dL (ref 3.5–5.0)
Alkaline Phosphatase: 56 U/L (ref 38–126)
Anion gap: 7 (ref 5–15)
BUN: 7 mg/dL (ref 6–20)
CO2: 22 mmol/L (ref 22–32)
Calcium: 8.9 mg/dL (ref 8.9–10.3)
Chloride: 108 mmol/L (ref 101–111)
Creatinine, Ser: 0.39 mg/dL — ABNORMAL LOW (ref 0.61–1.24)
GFR calc Af Amer: >60 mL/min (ref >60)
GFR calc non Af Amer: >60 mL/min (ref >60)
Glucose, Bld: 88 mg/dL (ref 65–99)
Potassium: 6.2 mmol/L — ABNORMAL HIGH (ref 3.5–5.1)
Sodium: 137 mmol/L (ref 135–145)
Total Bilirubin: 8 mg/dL — ABNORMAL HIGH (ref 0.3–1.2)
Total Protein: 8.6 g/dL — ABNORMAL HIGH (ref 6.5–8.1)

## 2016-12-18 LAB — RETICULOCYTES
RBC.: 2.1 MIL/uL — ABNORMAL LOW (ref 4.22–5.81)
Retic Count, Absolute: 424.2 10*3/uL — ABNORMAL HIGH (ref 19.0–186.0)
Retic Ct Pct: 20.2 % — ABNORMAL HIGH (ref 0.4–3.1)

## 2016-12-18 MED ORDER — HYDROMORPHONE HCL 1 MG/ML IJ SOLN
1.0000 mg | Freq: Once | INTRAMUSCULAR | Status: AC
  Administered 2016-12-18: 1 mg via INTRAVENOUS
  Filled 2016-12-18: qty 1

## 2016-12-18 MED ORDER — HYDROMORPHONE HCL 1 MG/ML IJ SOLN
0.5000 mg | Freq: Once | INTRAMUSCULAR | Status: AC
  Administered 2016-12-18: 0.5 mg via SUBCUTANEOUS
  Filled 2016-12-18: qty 1

## 2016-12-18 MED ORDER — OXYCODONE-ACETAMINOPHEN 5-325 MG PO TABS
2.0000 | ORAL_TABLET | Freq: Once | ORAL | Status: AC
  Administered 2016-12-18: 2 via ORAL
  Filled 2016-12-18: qty 2

## 2016-12-18 MED ORDER — DIPHENHYDRAMINE HCL 50 MG/ML IJ SOLN
25.0000 mg | Freq: Once | INTRAMUSCULAR | Status: AC
  Administered 2016-12-18: 25 mg via INTRAVENOUS
  Filled 2016-12-18: qty 1

## 2016-12-18 NOTE — ED Triage Notes (Signed)
Patient with history of sickle cell, states he woke up this morning in sickle cell crisis. Patient reports he is originally from [place] and is a patient of a sickle cell clinic in [place], but was visiting family this week in [place]. Patient reports a "throbbing pain" to bilateral shoulder, knee, and hip joints, as well as chest pain and back pain. Patient denies SOB. Patient reports usually taking oxycodone/percocet at home for pain, but reports not taking anything for pain today.

## 2016-12-18 NOTE — ED Provider Notes (Signed)
[facility]-EMERG[facility] DEPT Provider Note   CSN: [id number] Arrival date & time: 12/18/16  1143     History   Chief Complaint Chief Complaint  Patient presents with  . Sickle Cell Pain Crisis    HPI Martin [name] is a 26 y.o. male.  HPI Patient is a 26 year old male history of sickle cell disease and presents to the emergency department complaining of severe pain all over mainly in his shoulder joints and back.  He denies chest pain shortness of breath.  He tried home oxycodone without improvement in his symptoms.  Throbbing.  He is lives in [place] and is visiting friends and family here in [place].  Pain is moderate to severe in severity at this time.   Past Medical History:  Diagnosis Date  . Sickle cell crisis (HCC)     There are no active problems to display for this patient.   History reviewed. No pertinent surgical history.     Home Medications    Prior to Admission medications   Medication Sig Start Date End Date Taking? Authorizing Provider  ALPRAZolam Prudy Feeler(XANAX) 1 MG tablet Take 1 mg by mouth 2 (two) times daily. 12/03/16  Yes [provider]  hydroxyurea (HYDREA) 500 MG capsule Take 2,000 mg by mouth at bedtime. 12/28/15  Yes [provider]  ibuprofen (ADVIL,MOTRIN) 200 MG tablet Take 800 mg by mouth every 6 (six) hours as needed for moderate pain.   Yes [provider]    Family History No family history on file.  Social History Social History  Substance Use Topics  . Smoking status: Never Smoker  . Smokeless tobacco: Never Used  . Alcohol use No     Allergies   Ketorolac   Review of Systems Review of Systems  All other systems reviewed and are negative.    Physical Exam Updated Vital Signs BP 100/62   Pulse 80   Temp 98.4 F (36.9 C) (Oral)   Resp 16   Ht 6\' 3"  (1.905 m)   Wt 68 kg (150 lb)   SpO2 91%   BMI 18.75 kg/m   Physical Exam  Constitutional: He is oriented to person, place, and time. He  appears well-developed and well-nourished.  HENT:  Head: Normocephalic and atraumatic.  Eyes: EOM are normal.  Neck: Normal range of motion.  Cardiovascular: Normal rate, regular rhythm, normal heart sounds and intact distal pulses.   Pulmonary/Chest: Effort normal and breath sounds normal. No respiratory distress.  Abdominal: Soft. He exhibits no distension. There is no tenderness.  Musculoskeletal: Normal range of motion.  Neurological: He is alert and oriented to person, place, and time.  Skin: Skin is warm and dry.  Psychiatric: He has a normal mood and affect. Judgment normal.  Nursing note and vitals reviewed.    ED Treatments / Results  Labs (all labs ordered are listed, but only abnormal results are displayed) Labs Reviewed  COMPREHENSIVE METABOLIC PANEL - Abnormal; Notable for the following:       Result Value   Potassium 6.2 (*)    Creatinine, Ser 0.39 (*)    Total Protein 8.6 (*)    AST 126 (*)    ALT 64 (*)    Total Bilirubin 8.0 (*)    All other components within normal limits  CBC WITH DIFFERENTIAL/PLATELET - Abnormal; Notable for the following:    WBC 11.8 (*)    RBC 2.10 (*)    Hemoglobin 9.1 (*)    HCT 24.8 (*)  MCV 118.1 (*)    MCH 43.3 (*)    MCHC 36.7 (*)    RDW 23.5 (*)    nRBC 13 (*)    Neutro Abs 8.3 (*)    All other components within normal limits  RETICULOCYTES - Abnormal; Notable for the following:    Retic Ct Pct 20.2 (*)    RBC. 2.10 (*)    Retic Count, Absolute 424.2 (*)    All other components within normal limits  CBC WITH DIFFERENTIAL/PLATELET    EKG  EKG Interpretation None       Radiology Dg Chest 2 View  Result Date: 12/18/2016 CLINICAL DATA:  26 year old male with history of sickle cell pain crisis. EXAM: CHEST  2 VIEW COMPARISON:  Chest x-ray 08/09/2013. FINDINGS: Diffusely increased interstitial markings throughout the lungs bilaterally. Lung volumes are normal. No consolidative airspace disease. No pleural effusions.  No pneumothorax. No pulmonary nodule or mass noted. Pulmonary vasculature and the cardiomediastinal silhouette are within normal limits. IMPRESSION: 1. No radiographic evidence of acute cardiopulmonary disease. 2. Chronic interstitial prominence throughout the lungs bilaterally, similar to prior studies. Electronically Signed   By: Trudie Reed M.D.   On: 12/18/2016 12:28    Procedures Procedures (including critical care time)  Medications Ordered in ED Medications  HYDROmorphone (DILAUDID) injection 0.5 mg (0.5 mg Subcutaneous Given 12/18/16 1205)  HYDROmorphone (DILAUDID) injection 1 mg (1 mg Intravenous Given 12/18/16 1431)  diphenhydrAMINE (BENADRYL) injection 25 mg (25 mg Intravenous Given 12/18/16 1431)  HYDROmorphone (DILAUDID) injection 1 mg (1 mg Intravenous Given 12/18/16 1557)  oxyCODONE-acetaminophen (PERCOCET/ROXICET) 5-325 MG per tablet 2 tablet (2 tablets Oral Given 12/18/16 1557)     Initial Impression / Assessment and Plan / ED Course  I have reviewed the triage vital signs and the nursing notes.  Pertinent labs & imaging results that were available during my care of the patient were reviewed by me and considered in my medical decision making (see chart for details).     Patient is overall well-appearing.  Feels much better this time.  Discharge home in good condition.  Primary care follow-up.  Patient understands to return to the ER for new or worsening symptoms  Final Clinical Impressions(s) / ED Diagnoses   Final diagnoses:  Sickle cell pain crisis [facility])    New Prescriptions New Prescriptions   No medications on file     Azalia Bilis, MD 12/18/16 [phone]

## 2016-12-18 NOTE — ED Notes (Signed)
[facility]  [place] di not know that he could bring his pain meds with him

## 2016-12-20 DIAGNOSIS — D571 Sickle-cell disease without crisis: Secondary | ICD-10-CM | POA: Insufficient documentation

## 2017-01-17 ENCOUNTER — Emergency Department (HOSPITAL_COMMUNITY)
Admission: EM | Admit: 2017-01-17 | Discharge: 2017-01-17 | Disposition: A | Discharge status: Home / Self Care | Payer: Medicaid - Out of State | Attending: Emergency Medicine | Admitting: Emergency Medicine

## 2017-01-17 ENCOUNTER — Encounter (HOSPITAL_COMMUNITY): Payer: Self-pay | Admitting: Emergency Medicine

## 2017-01-17 DIAGNOSIS — D5700 Hb-SS disease with crisis, unspecified: Secondary | ICD-10-CM

## 2017-01-17 DIAGNOSIS — D57219 Sickle-cell/Hb-C disease with crisis, unspecified: Secondary | ICD-10-CM | POA: Diagnosis not present

## 2017-01-17 DIAGNOSIS — Z79899 Other long term (current) drug therapy: Secondary | ICD-10-CM | POA: Insufficient documentation

## 2017-01-17 LAB — RETICULOCYTES
RBC.: 2.29 MIL/uL — ABNORMAL LOW (ref 4.22–5.81)
Retic Count, Absolute: 496.9 10*3/uL — ABNORMAL HIGH (ref 19.0–186.0)
Retic Ct Pct: 21.7 % — ABNORMAL HIGH (ref 0.4–3.1)

## 2017-01-17 LAB — COMPREHENSIVE METABOLIC PANEL
ALT: 32 U/L (ref 17–63)
AST: 71 U/L — ABNORMAL HIGH (ref 15–41)
Albumin: 4.3 g/dL (ref 3.5–5.0)
Alkaline Phosphatase: 57 U/L (ref 38–126)
Anion gap: 8 (ref 5–15)
BUN: 12 mg/dL (ref 6–20)
CO2: 23 mmol/L (ref 22–32)
Calcium: 9.1 mg/dL (ref 8.9–10.3)
Chloride: 106 mmol/L (ref 101–111)
Creatinine, Ser: 0.6 mg/dL — ABNORMAL LOW (ref 0.61–1.24)
GFR calc Af Amer: >60 mL/min (ref >60)
GFR calc non Af Amer: >60 mL/min (ref >60)
Glucose, Bld: 98 mg/dL (ref 65–99)
Potassium: 4.7 mmol/L (ref 3.5–5.1)
Sodium: 137 mmol/L (ref 135–145)
Total Bilirubin: 7 mg/dL — ABNORMAL HIGH (ref 0.3–1.2)
Total Protein: 9.1 g/dL — ABNORMAL HIGH (ref 6.5–8.1)

## 2017-01-17 LAB — CBC WITH DIFFERENTIAL/PLATELET
Band Neutrophils: 0 %
Basophils Absolute: 0.1 10*3/uL (ref 0.0–0.1)
Basophils Relative: 1 %
Blasts: 0 %
Eosinophils Absolute: 0.1 10*3/uL (ref 0.0–0.7)
Eosinophils Relative: 1 %
HCT: 25.6 % — ABNORMAL LOW (ref 39.0–52.0)
Hemoglobin: 9.4 g/dL — ABNORMAL LOW (ref 13.0–17.0)
Lymphocytes Relative: 42 %
Lymphs Abs: 4.7 10*3/uL — ABNORMAL HIGH (ref 0.7–4.0)
MCH: 41 pg — ABNORMAL HIGH (ref 26.0–34.0)
MCHC: 36.7 g/dL — ABNORMAL HIGH (ref 30.0–36.0)
MCV: 111.8 fL — ABNORMAL HIGH (ref 78.0–100.0)
Metamyelocytes Relative: 0 %
Monocytes Absolute: 0.7 10*3/uL (ref 0.1–1.0)
Monocytes Relative: 6 %
Myelocytes: 0 %
Neutro Abs: 5.6 10*3/uL (ref 1.7–7.7)
Neutrophils Relative %: 50 %
Other: 0 %
Platelets: 191 10*3/uL (ref 150–400)
Promyelocytes Absolute: 0 %
RBC: 2.29 MIL/uL — ABNORMAL LOW (ref 4.22–5.81)
RDW: 23.6 % — ABNORMAL HIGH (ref 11.5–15.5)
WBC: 11.2 10*3/uL — ABNORMAL HIGH (ref 4.0–10.5)
nRBC: 19 /100 WBC — ABNORMAL HIGH

## 2017-01-17 MED ORDER — DIPHENHYDRAMINE HCL 50 MG/ML IJ SOLN
25.0000 mg | Freq: Once | INTRAMUSCULAR | Status: AC
  Administered 2017-01-17: 25 mg via INTRAVENOUS
  Filled 2017-01-17: qty 1

## 2017-01-17 MED ORDER — HYDROMORPHONE HCL 1 MG/ML IJ SOLN
2.0000 mg | INTRAMUSCULAR | Status: AC
  Administered 2017-01-17: 2 mg via INTRAVENOUS
  Filled 2017-01-17: qty 2

## 2017-01-17 MED ORDER — ONDANSETRON 4 MG PO TBDP
4.0000 mg | ORAL_TABLET | Freq: Once | ORAL | Status: AC
  Administered 2017-01-17: 4 mg via ORAL
  Filled 2017-01-17: qty 1

## 2017-01-17 MED ORDER — HYDROMORPHONE HCL 1 MG/ML IJ SOLN
0.5000 mg | Freq: Once | INTRAMUSCULAR | Status: AC
  Administered 2017-01-17: 0.5 mg via SUBCUTANEOUS
  Filled 2017-01-17: qty 1

## 2017-01-17 MED ORDER — HYDROMORPHONE HCL 1 MG/ML IJ SOLN: 2.0000 mg | INTRAMUSCULAR | Status: AC

## 2017-01-17 MED ORDER — ONDANSETRON HCL 4 MG/2ML IJ SOLN
4.0000 mg | INTRAMUSCULAR | Status: DC | PRN
  Administered 2017-01-17: 4 mg via INTRAVENOUS
  Filled 2017-01-17: qty 2

## 2017-01-17 MED ORDER — HYDROMORPHONE HCL 1 MG/ML IJ SOLN
2.0000 mg | INTRAMUSCULAR | Status: DC
  Administered 2017-01-17: 2 mg via INTRAVENOUS
  Filled 2017-01-17: qty 2

## 2017-01-17 MED ORDER — HYDROMORPHONE HCL 1 MG/ML IJ SOLN: 2.0000 mg | INTRAMUSCULAR | Status: DC

## 2017-01-17 NOTE — ED Provider Notes (Signed)
[facility]-EMERG[facility]Y DEPT Provider Note   CSN: [id number] Arrival date & time: 01/17/17  0118     History   Chief Complaint Chief Complaint  Patient presents with  . Sickle Cell Pain Crisis    HPI Martin [name] is a 26 y.o. male.  26 year old male with history of sickle cell disease presents with his usual pain crisis. Pain is localized to his flanks and without fever, shortness of breath, cough or congestion. Denies any urinary symptoms. Patient has uses Percocet without relief. States that the change in weather recently is likely what has exacerbated his condition. No other complaints at this time.      Past Medical History:  Diagnosis Date  . Sickle cell crisis (HCC)     There are no active problems to display for this patient.   History reviewed. No pertinent surgical history.     Home Medications    Prior to Admission medications   Medication Sig Start Date End Date Taking? Authorizing Provider  ALPRAZolam Prudy Feeler) 1 MG tablet Take 1 mg by mouth 2 (two) times daily. 12/03/16  Yes [provider]  guaiFENesin-codeine 100-10 MG/5ML syrup Take 10 mLs by mouth 4 (four) times daily as needed for cough.  01/01/17  Yes [provider]  hydroxyurea (HYDREA) 500 MG capsule Take 2,000 mg by mouth at bedtime. 12/28/15  Yes [provider]  ibuprofen (ADVIL,MOTRIN) 200 MG tablet Take 800 mg by mouth every 6 (six) hours as needed for moderate pain.   Yes [provider]  oxyCODONE-acetaminophen (PERCOCET) 10-325 MG tablet Take 1 tablet by mouth every 6 (six) hours as needed for pain.  01/05/17  Yes [provider]  TRIUMEQ 600-50-300 MG tablet Take 1 tablet by mouth at bedtime.  12/22/16  Yes [provider]  XARELTO 10 MG TABS tablet Take 10 mg by mouth at bedtime.  01/04/17  Yes [provider]    Family History History reviewed. No pertinent family history.  Social History Social History  Substance Use Topics  .  Smoking status: Never Smoker  . Smokeless tobacco: Never Used  . Alcohol use No     Allergies   Ketorolac   Review of Systems Review of Systems  All other systems reviewed and are negative.    Physical Exam Updated Vital Signs BP 114/74 (BP Location: Left Arm)   Pulse 83   Temp 98.5 F (36.9 C) (Oral)   Resp 16   Ht 1.905 m ( )   Wt 68 kg (150 lb)   SpO2 92%   BMI 18.75 kg/m   Physical Exam  Constitutional: He is oriented to person, place, and time. He appears well-developed and well-nourished.  Non-toxic appearance. No distress.  HENT:  Head: Normocephalic and atraumatic.  Eyes: Pupils are equal, round, and reactive to light. Conjunctivae, EOM and lids are normal.  Neck: Normal range of motion. Neck supple. No tracheal deviation present. No thyroid mass present.  Cardiovascular: Normal rate, regular rhythm and normal heart sounds.  Exam reveals no gallop.   No murmur heard. Pulmonary/Chest: Effort normal and breath sounds normal. No stridor. No respiratory distress. He has no decreased breath sounds. He has no wheezes. He has no rhonchi. He has no rales.  Abdominal: Soft. Normal appearance and bowel sounds are normal. He exhibits no distension. There is no tenderness. There is no rebound and no CVA tenderness.  Musculoskeletal: Normal range of motion. He exhibits no edema or tenderness.  Neurological: He is alert and oriented  to person, place, and time. He has normal strength. No cranial nerve deficit or sensory deficit. GCS eye subscore is 4. GCS verbal subscore is 5. GCS motor subscore is 6.  Skin: Skin is warm and dry. No abrasion and no rash noted.  Psychiatric: He has a normal mood and affect. His speech is normal and behavior is normal.  Nursing note and vitals reviewed.    ED Treatments / Results  Labs (all labs ordered are listed, but only abnormal results are displayed) Labs Reviewed  COMPREHENSIVE METABOLIC PANEL - Abnormal; Notable for the following:        Result Value   Creatinine, Ser 0.60 (*)    Total Protein 9.1 (*)    AST 71 (*)    Total Bilirubin 7.0 (*)    All other components within normal limits  CBC WITH DIFFERENTIAL/PLATELET - Abnormal; Notable for the following:    WBC 11.2 (*)    RBC 2.29 (*)    Hemoglobin 9.4 (*)    HCT 25.6 (*)    MCV 111.8 (*)    MCH 41.0 (*)    MCHC 36.7 (*)    RDW 23.6 (*)    nRBC 19 (*)    Lymphs Abs 4.7 (*)    All other components within normal limits  RETICULOCYTES - Abnormal; Notable for the following:    Retic Ct Pct 21.7 (*)    RBC. 2.29 (*)    Retic Count, Absolute 496.9 (*)    All other components within normal limits    EKG  EKG Interpretation None       Radiology No results found.  Procedures Procedures (including critical care time)  Medications Ordered in ED Medications  HYDROmorphone (DILAUDID) injection 2 mg (not administered)    Or  HYDROmorphone (DILAUDID) injection 2 mg (not administered)  HYDROmorphone (DILAUDID) injection 2 mg (not administered)    Or  HYDROmorphone (DILAUDID) injection 2 mg (not administered)  HYDROmorphone (DILAUDID) injection 2 mg (not administered)    Or  HYDROmorphone (DILAUDID) injection 2 mg (not administered)  ondansetron (ZOFRAN) injection 4 mg (4 mg Intravenous Given 01/17/17 0721)  HYDROmorphone (DILAUDID) injection 0.5 mg (0.5 mg Subcutaneous Given 01/17/17 0206)  ondansetron (ZOFRAN-ODT) disintegrating tablet 4 mg (4 mg Oral Given 01/17/17 0206)  HYDROmorphone (DILAUDID) injection 2 mg (2 mg Intravenous Given 01/17/17 0721)    Or  HYDROmorphone (DILAUDID) injection 2 mg ( Subcutaneous See Alternative 01/17/17 0721)  diphenhydrAMINE (BENADRYL) injection 25 mg (25 mg Intravenous Given 01/17/17 0720)     Initial Impression / Assessment and Plan / ED Course  I have reviewed the triage vital signs and the nursing notes.  Pertinent labs & imaging results that were available during my care of the patient were reviewed by me and  considered in my medical decision making (see chart for details).     Patient treated with multiple rounds of IV hydromorphone and feels better. Request to go home at this time and will follow-up with his Dr. In [place]  Final Clinical Impressions(s) / ED Diagnoses   Final diagnoses:  None    New Prescriptions New Prescriptions   No medications on file     Lorre Nick, MD 01/17/17 1046

## 2017-01-17 NOTE — ED Triage Notes (Addendum)
Patient complaining of sickle cell pain crisis that started 01/16/2017 around 9 pm. Patient states he hurts all over. Patient states his pain medication is not working. Patient does not have a port.

## 2017-01-17 NOTE — ED Notes (Signed)
Bed: WA25 Expected date:  Expected time:  Means of arrival:  Comments: 

## 2017-01-27 ENCOUNTER — Emergency Department (HOSPITAL_COMMUNITY)
Admission: EM | Admit: 2017-01-27 | Discharge: 2017-01-27 | Disposition: A | Discharge status: Home / Self Care | Payer: Medicaid - Out of State | Attending: Emergency Medicine | Admitting: Emergency Medicine

## 2017-01-27 DIAGNOSIS — Z79899 Other long term (current) drug therapy: Secondary | ICD-10-CM | POA: Diagnosis not present

## 2017-01-27 DIAGNOSIS — D5700 Hb-SS disease with crisis, unspecified: Secondary | ICD-10-CM | POA: Insufficient documentation

## 2017-01-27 DIAGNOSIS — Z7901 Long term (current) use of anticoagulants: Secondary | ICD-10-CM | POA: Diagnosis not present

## 2017-01-27 DIAGNOSIS — M545 Low back pain: Secondary | ICD-10-CM | POA: Diagnosis present

## 2017-01-27 LAB — COMPREHENSIVE METABOLIC PANEL
ALT: 26 U/L (ref 17–63)
AST: 38 U/L (ref 15–41)
Albumin: 4 g/dL (ref 3.5–5.0)
Alkaline Phosphatase: 57 U/L (ref 38–126)
Anion gap: 7 (ref 5–15)
BUN: 7 mg/dL (ref 6–20)
CO2: 25 mmol/L (ref 22–32)
Calcium: 9.1 mg/dL (ref 8.9–10.3)
Chloride: 109 mmol/L (ref 101–111)
Creatinine, Ser: 0.64 mg/dL (ref 0.61–1.24)
GFR calc Af Amer: >60 mL/min (ref >60)
GFR calc non Af Amer: >60 mL/min (ref >60)
Glucose, Bld: 89 mg/dL (ref 65–99)
Potassium: 3.8 mmol/L (ref 3.5–5.1)
Sodium: 141 mmol/L (ref 135–145)
Total Bilirubin: 8.8 mg/dL — ABNORMAL HIGH (ref 0.3–1.2)
Total Protein: 8.4 g/dL — ABNORMAL HIGH (ref 6.5–8.1)

## 2017-01-27 LAB — CBC WITH DIFFERENTIAL/PLATELET
Basophils Absolute: 0 10*3/uL (ref 0.0–0.1)
Basophils Relative: 0 %
Eosinophils Absolute: 0.1 10*3/uL (ref 0.0–0.7)
Eosinophils Relative: 1 %
HCT: 24.3 % — ABNORMAL LOW (ref 39.0–52.0)
Hemoglobin: 9.1 g/dL — ABNORMAL LOW (ref 13.0–17.0)
Lymphocytes Relative: 36 %
Lymphs Abs: 4.9 10*3/uL — ABNORMAL HIGH (ref 0.7–4.0)
MCH: 41.6 pg — ABNORMAL HIGH (ref 26.0–34.0)
MCHC: 37.4 g/dL — ABNORMAL HIGH (ref 30.0–36.0)
MCV: 111 fL — ABNORMAL HIGH (ref 78.0–100.0)
Monocytes Absolute: 1.8 10*3/uL — ABNORMAL HIGH (ref 0.1–1.0)
Monocytes Relative: 13 %
Neutro Abs: 6.8 10*3/uL (ref 1.7–7.7)
Neutrophils Relative %: 50 %
Platelets: 423 10*3/uL — ABNORMAL HIGH (ref 150–400)
RBC: 2.19 MIL/uL — ABNORMAL LOW (ref 4.22–5.81)
RDW: 22.6 % — ABNORMAL HIGH (ref 11.5–15.5)
WBC: 13.6 10*3/uL — ABNORMAL HIGH (ref 4.0–10.5)

## 2017-01-27 LAB — RETICULOCYTES
RBC.: 2.19 MIL/uL — ABNORMAL LOW (ref 4.22–5.81)
Retic Count, Absolute: 346 10*3/uL — ABNORMAL HIGH (ref 19.0–186.0)
Retic Ct Pct: 15.8 % — ABNORMAL HIGH (ref 0.4–3.1)

## 2017-01-27 MED ORDER — HYDROMORPHONE HCL 1 MG/ML IJ SOLN: 2.0000 mg | INTRAMUSCULAR | Status: AC

## 2017-01-27 MED ORDER — OXYCODONE-ACETAMINOPHEN 5-325 MG PO TABS
2.0000 | ORAL_TABLET | Freq: Once | ORAL | Status: AC
  Administered 2017-01-27: 2 via ORAL
  Filled 2017-01-27: qty 2

## 2017-01-27 MED ORDER — HYDROMORPHONE HCL 1 MG/ML IJ SOLN
2.0000 mg | INTRAMUSCULAR | Status: AC
  Administered 2017-01-27: 2 mg via INTRAVENOUS
  Filled 2017-01-27: qty 2

## 2017-01-27 MED ORDER — HYDROMORPHONE HCL 1 MG/ML IJ SOLN
2.0000 mg | INTRAMUSCULAR | Status: AC
  Filled 2017-01-27 (×2): qty 2

## 2017-01-27 MED ORDER — DIPHENHYDRAMINE HCL 25 MG PO CAPS
25.0000 mg | ORAL_CAPSULE | ORAL | Status: DC | PRN
  Administered 2017-01-27: 25 mg via ORAL
  Filled 2017-01-27: qty 1

## 2017-01-27 MED ORDER — HYDROMORPHONE HCL 1 MG/ML IJ SOLN
2.0000 mg | INTRAMUSCULAR | Status: AC
  Administered 2017-01-27: 2 mg via INTRAVENOUS

## 2017-01-27 MED ORDER — ONDANSETRON HCL 4 MG/2ML IJ SOLN
4.0000 mg | INTRAMUSCULAR | Status: DC | PRN
  Administered 2017-01-27: 4 mg via INTRAVENOUS
  Filled 2017-01-27: qty 2

## 2017-01-27 MED ORDER — SODIUM CHLORIDE 0.45 % IV SOLN
INTRAVENOUS | Status: DC
  Administered 2017-01-27: 05:00:00 via INTRAVENOUS

## 2017-01-27 NOTE — ED Provider Notes (Signed)
TIME SEEN: 3:22 AM  CHIEF COMPLAINT: Sickle cell pain  HPI: Patient is a 26 year old male with history of sickle cell anemia who presents to the emergency department sickle cell pain that started last night. States it is mostly in his lower back and bilateral hips which is chronic for him. No fevers, cough, nausea, vomiting or diarrhea. No chest pain or shortness of breath. No numbness, tingling or focal weakness. No bowel or bladder incontinence. No urinary retention. He is able to ambulate. Takes oxycodone at home. He is here visiting friends from [place].  States that his hemoglobin is normally between 8 and 9.  ROS: See HPI Constitutional: no fever  Eyes: no drainage  ENT: no runny nose   Cardiovascular:  no chest pain  Resp: no SOB  GI: no vomiting GU: no dysuria Integumentary: no rash  Allergy: no hives  Musculoskeletal: no leg swelling  Neurological: no slurred speech ROS otherwise negative  PAST MEDICAL HISTORY/PAST SURGICAL HISTORY:  Past Medical History:  Diagnosis Date  . Sickle cell crisis [facility])     MEDICATIONS:  Prior to Admission medications   Medication Sig Start Date End Date Taking? Authorizing Provider  ALPRAZolam Prudy Feeler) 1 MG tablet Take 1 mg by mouth 2 (two) times daily. 12/03/16   [provider]  guaiFENesin-codeine 100-10 MG/5ML syrup Take 10 mLs by mouth 4 (four) times daily as needed for cough.  01/01/17   [provider]  hydroxyurea (HYDREA) 500 MG capsule Take 2,000 mg by mouth at bedtime. 12/28/15   [provider]  ibuprofen (ADVIL,MOTRIN) 200 MG tablet Take 800 mg by mouth every 6 (six) hours as needed for moderate pain.    [provider]  oxyCODONE-acetaminophen (PERCOCET) 10-325 MG tablet Take 1 tablet by mouth every 6 (six) hours as needed for pain.  01/05/17   [provider]  TRIUMEQ 600-50-300 MG tablet Take 1 tablet by mouth at bedtime.  12/22/16   [provider]  XARELTO 10 MG TABS  tablet Take 10 mg by mouth at bedtime.  01/04/17   [provider]    ALLERGIES:  Allergies  Allergen Reactions  . Ketorolac Swelling    Patient reports facial edema and left arm edema after administration.    SOCIAL HISTORY:  Social History  Substance Use Topics  . Smoking status: Never Smoker  . Smokeless tobacco: Never Used  . Alcohol use No    FAMILY HISTORY: No family history on file.  EXAM: BP 112/70 (BP Location: Left Arm)   Pulse 84   Temp 98.1 F (36.7 C) (Oral)   Resp 18   SpO2 92%  CONSTITUTIONAL: Alert and oriented and responds appropriately to questions. Well-appearing; well-nourished HEAD: Normocephalic EYES: Conjunctivae clear, pupils appear equal, EOMI ENT: normal nose; moist mucous membranes NECK: Supple, no meningismus, no nuchal rigidity, no LAD  CARD: RRR; S1 and S2 appreciated; no murmurs, no clicks, no rubs, no gallops RESP: Normal chest excursion without splinting or tachypnea; breath sounds clear and equal bilaterally; no wheezes, no rhonchi, no rales, no hypoxia or respiratory distress, speaking full sentences ABD/GI: Normal bowel sounds; non-distended; soft, non-tender, no rebound, no guarding, no peritoneal signs, no hepatosplenomegaly BACK:  The back appears normal and is non-tender to palpation, there is no CVA tenderness EXT: Normal ROM in all joints; non-tender to palpation; no edema; normal capillary refill; no cyanosis, no calf tenderness or swelling    SKIN: Normal color for age and race; warm; no rash NEURO: Moves all  extremities equally, reports normal sensation diffusely, normal gait, no saddle anesthesia, normal speech PSYCH: The patient's mood and manner are appropriate. Grooming and personal hygiene are appropriate.  MEDICAL DECISION MAKING: Patient here with sickle cell crisis. No red flag symptoms. No fevers, vomiting, neurologic deficits. No chest pain or shortness of breath. Will obtain labs. Will treat symptoms with IV  fluids, oral Benadryl, IV Dilaudid. We'll reassess after 3 rounds of pain medication.  ED PROGRESS: Pt has had good relief in his pain after 3 rounds of pain medication. His blood pressure has dropped slightly but I expect this after multiple rounds of narcotics. Doubt sepsis. His hemoglobin is at baseline. He does appear to have some lab changes consistent with mild sickle cell crisis but given he appears comfortable and is hemodynamically stable, I feel he is safe for discharge. He agrees with this plan.Discussed return precautions. He has outpatient primary care provider in [place] he can follow-up with.   At this time, I do not feel there is any life-threatening condition present. I have reviewed and discussed all results (EKG, imaging, lab, urine as appropriate) and exam findings with patient/family. I have reviewed nursing notes and appropriate previous records.  I feel the patient is safe to be discharged home without further emergent workup and can continue workup as an outpatient as needed. Discussed usual and customary return precautions. Patient/family verbalize understanding and are comfortable with this plan.  Outpatient follow-up has been provided if needed. All questions have been answered.      Tabia Landowski, Layla Maw, DO 01/27/17 [phone]

## 2017-01-27 NOTE — ED Notes (Signed)
Unable to collect labs nursewill be in the room to collect labs

## 2017-02-01 ENCOUNTER — Encounter (HOSPITAL_COMMUNITY): Payer: Self-pay | Admitting: *Deleted

## 2017-02-01 DIAGNOSIS — D5700 Hb-SS disease with crisis, unspecified: Principal | ICD-10-CM | POA: Diagnosis present

## 2017-02-01 DIAGNOSIS — Z79899 Other long term (current) drug therapy: Secondary | ICD-10-CM

## 2017-02-01 DIAGNOSIS — Z9981 Dependence on supplemental oxygen: Secondary | ICD-10-CM

## 2017-02-01 DIAGNOSIS — K219 Gastro-esophageal reflux disease without esophagitis: Secondary | ICD-10-CM | POA: Diagnosis present

## 2017-02-01 DIAGNOSIS — K5900 Constipation, unspecified: Secondary | ICD-10-CM | POA: Diagnosis present

## 2017-02-01 DIAGNOSIS — X58XXXA Exposure to other specified factors, initial encounter: Secondary | ICD-10-CM | POA: Diagnosis present

## 2017-02-01 DIAGNOSIS — T8089XA Other complications following infusion, transfusion and therapeutic injection, initial encounter: Secondary | ICD-10-CM | POA: Diagnosis present

## 2017-02-01 DIAGNOSIS — R0902 Hypoxemia: Secondary | ICD-10-CM | POA: Diagnosis present

## 2017-02-01 DIAGNOSIS — Z886 Allergy status to analgesic agent status: Secondary | ICD-10-CM

## 2017-02-01 DIAGNOSIS — Z21 Asymptomatic human immunodeficiency virus [HIV] infection status: Secondary | ICD-10-CM | POA: Diagnosis present

## 2017-02-01 DIAGNOSIS — D72829 Elevated white blood cell count, unspecified: Secondary | ICD-10-CM | POA: Diagnosis present

## 2017-02-01 DIAGNOSIS — F411 Generalized anxiety disorder: Secondary | ICD-10-CM | POA: Diagnosis present

## 2017-02-01 DIAGNOSIS — R011 Cardiac murmur, unspecified: Secondary | ICD-10-CM | POA: Diagnosis present

## 2017-02-01 LAB — COMPREHENSIVE METABOLIC PANEL
ALT: 28 U/L (ref 17–63)
AST: 50 U/L — ABNORMAL HIGH (ref 15–41)
Albumin: 4 g/dL (ref 3.5–5.0)
Alkaline Phosphatase: 57 U/L (ref 38–126)
Anion gap: 10 (ref 5–15)
BUN: <5 mg/dL — ABNORMAL LOW (ref 6–20)
CO2: 22 mmol/L (ref 22–32)
Calcium: 9.1 mg/dL (ref 8.9–10.3)
Chloride: 108 mmol/L (ref 101–111)
Creatinine, Ser: 0.82 mg/dL (ref 0.61–1.24)
GFR calc Af Amer: >60 mL/min (ref >60)
GFR calc non Af Amer: >60 mL/min (ref >60)
Glucose, Bld: 85 mg/dL (ref 65–99)
Potassium: 3.9 mmol/L (ref 3.5–5.1)
Sodium: 140 mmol/L (ref 135–145)
Total Bilirubin: 7.7 mg/dL — ABNORMAL HIGH (ref 0.3–1.2)
Total Protein: 8.4 g/dL — ABNORMAL HIGH (ref 6.5–8.1)

## 2017-02-01 LAB — RETICULOCYTES
RBC.: 2.47 MIL/uL — ABNORMAL LOW (ref 4.22–5.81)
Retic Ct Pct: >23 % — ABNORMAL HIGH (ref 0.4–3.1)

## 2017-02-01 LAB — CBC WITH DIFFERENTIAL/PLATELET
Band Neutrophils: 0 %
Basophils Absolute: 0 10*3/uL (ref 0.0–0.1)
Basophils Relative: 0 %
Blasts: 0 %
Eosinophils Absolute: 0 10*3/uL (ref 0.0–0.7)
Eosinophils Relative: 0 %
HCT: 27.8 % — ABNORMAL LOW (ref 39.0–52.0)
Hemoglobin: 9.9 g/dL — ABNORMAL LOW (ref 13.0–17.0)
Lymphocytes Relative: 20 %
Lymphs Abs: 3.4 10*3/uL (ref 0.7–4.0)
MCH: 40.1 pg — ABNORMAL HIGH (ref 26.0–34.0)
MCHC: 35.6 g/dL (ref 30.0–36.0)
MCV: 112.6 fL — ABNORMAL HIGH (ref 78.0–100.0)
Metamyelocytes Relative: 0 %
Monocytes Absolute: 0.9 10*3/uL (ref 0.1–1.0)
Monocytes Relative: 5 %
Myelocytes: 0 %
Neutro Abs: 12.9 10*3/uL — ABNORMAL HIGH (ref 1.7–7.7)
Neutrophils Relative %: 75 %
Other: 0 %
Platelets: 400 10*3/uL (ref 150–400)
Promyelocytes Absolute: 0 %
RBC: 2.47 MIL/uL — ABNORMAL LOW (ref 4.22–5.81)
RDW: 24.5 % — ABNORMAL HIGH (ref 11.5–15.5)
WBC: 17.2 10*3/uL — ABNORMAL HIGH (ref 4.0–10.5)
nRBC: 0 /100 WBC

## 2017-02-01 MED ORDER — OXYCODONE-ACETAMINOPHEN 5-325 MG PO TABS
2.0000 | ORAL_TABLET | ORAL | Status: DC | PRN
  Administered 2017-02-01: 2 via ORAL

## 2017-02-01 MED ORDER — OXYCODONE-ACETAMINOPHEN 5-325 MG PO TABS
ORAL_TABLET | ORAL | Status: AC
  Filled 2017-02-01: qty 2

## 2017-02-01 NOTE — ED Triage Notes (Signed)
Pt in c/o sickle cell crisis,  Pt c/o generalized pain, pt has yellow sclera to bil eyes, pt denies SOB, pt reports being out of Oxycodone 10 for pain, will not have a refill until Weds., pt see MD in [place] where he lives, pt A&O x4

## 2017-02-02 ENCOUNTER — Encounter (HOSPITAL_COMMUNITY): Payer: Self-pay | Admitting: Physician Assistant

## 2017-02-02 ENCOUNTER — Inpatient Hospital Stay (HOSPITAL_COMMUNITY)
Admission: EM | Admit: 2017-02-02 | Discharge: 2017-02-05 | DRG: 812 | Disposition: A | Discharge status: Home / Self Care | Payer: Medicaid - Out of State | Attending: Internal Medicine | Admitting: Internal Medicine

## 2017-02-02 DIAGNOSIS — D57219 Sickle-cell/Hb-C disease with crisis, unspecified: Secondary | ICD-10-CM | POA: Diagnosis not present

## 2017-02-02 DIAGNOSIS — R011 Cardiac murmur, unspecified: Secondary | ICD-10-CM | POA: Diagnosis present

## 2017-02-02 DIAGNOSIS — T8089XA Other complications following infusion, transfusion and therapeutic injection, initial encounter: Secondary | ICD-10-CM | POA: Diagnosis present

## 2017-02-02 DIAGNOSIS — B20 Human immunodeficiency virus [HIV] disease: Secondary | ICD-10-CM | POA: Diagnosis present

## 2017-02-02 DIAGNOSIS — D5700 Hb-SS disease with crisis, unspecified: Secondary | ICD-10-CM | POA: Diagnosis present

## 2017-02-02 DIAGNOSIS — F411 Generalized anxiety disorder: Secondary | ICD-10-CM | POA: Diagnosis not present

## 2017-02-02 DIAGNOSIS — K219 Gastro-esophageal reflux disease without esophagitis: Secondary | ICD-10-CM | POA: Diagnosis not present

## 2017-02-02 DIAGNOSIS — Z21 Asymptomatic human immunodeficiency virus [HIV] infection status: Secondary | ICD-10-CM | POA: Diagnosis present

## 2017-02-02 DIAGNOSIS — Z9981 Dependence on supplemental oxygen: Secondary | ICD-10-CM | POA: Diagnosis not present

## 2017-02-02 DIAGNOSIS — R0902 Hypoxemia: Secondary | ICD-10-CM | POA: Diagnosis present

## 2017-02-02 DIAGNOSIS — X58XXXA Exposure to other specified factors, initial encounter: Secondary | ICD-10-CM | POA: Diagnosis present

## 2017-02-02 DIAGNOSIS — K5900 Constipation, unspecified: Secondary | ICD-10-CM | POA: Diagnosis present

## 2017-02-02 DIAGNOSIS — D638 Anemia in other chronic diseases classified elsewhere: Secondary | ICD-10-CM | POA: Diagnosis not present

## 2017-02-02 DIAGNOSIS — Z79899 Other long term (current) drug therapy: Secondary | ICD-10-CM | POA: Diagnosis not present

## 2017-02-02 DIAGNOSIS — D72829 Elevated white blood cell count, unspecified: Secondary | ICD-10-CM | POA: Diagnosis present

## 2017-02-02 DIAGNOSIS — J9611 Chronic respiratory failure with hypoxia: Secondary | ICD-10-CM | POA: Diagnosis not present

## 2017-02-02 DIAGNOSIS — Z886 Allergy status to analgesic agent status: Secondary | ICD-10-CM | POA: Diagnosis not present

## 2017-02-02 HISTORY — DX: Sickle-cell disease without crisis: D57.1

## 2017-02-02 HISTORY — DX: Anxiety disorder, unspecified: F41.9

## 2017-02-02 HISTORY — DX: Human immunodeficiency virus (HIV) disease: B20

## 2017-02-02 HISTORY — DX: Asymptomatic human immunodeficiency virus (hiv) infection status: Z21

## 2017-02-02 LAB — URINALYSIS, ROUTINE W REFLEX MICROSCOPIC
Bilirubin Urine: NEGATIVE
Glucose, UA: NEGATIVE mg/dL
Hgb urine dipstick: NEGATIVE
Ketones, ur: NEGATIVE mg/dL
Leukocytes, UA: NEGATIVE
Nitrite: NEGATIVE
Protein, ur: NEGATIVE mg/dL
Specific Gravity, Urine: 1.012 (ref 1.005–1.030)
pH: 5 (ref 5.0–8.0)

## 2017-02-02 LAB — IRON AND TIBC
Iron: 130 ug/dL (ref 45–182)
Saturation Ratios: 78 % — ABNORMAL HIGH (ref 17.9–39.5)
TIBC: 167 ug/dL — ABNORMAL LOW (ref 250–450)
UIBC: 37 ug/dL

## 2017-02-02 LAB — LACTATE DEHYDROGENASE: LDH: 484 U/L — ABNORMAL HIGH (ref 98–192)

## 2017-02-02 LAB — RETICULOCYTES
RBC.: 2.07 MIL/uL — ABNORMAL LOW (ref 4.22–5.81)
Retic Ct Pct: >23 % — ABNORMAL HIGH (ref 0.4–3.1)

## 2017-02-02 LAB — BILIRUBIN, FRACTIONATED(TOT/DIR/INDIR)
Bilirubin, Direct: 0.4 mg/dL (ref 0.1–0.5)
Indirect Bilirubin: 6.8 mg/dL — ABNORMAL HIGH (ref 0.3–0.9)
Total Bilirubin: 7.2 mg/dL — ABNORMAL HIGH (ref 0.3–1.2)

## 2017-02-02 LAB — FERRITIN: Ferritin: 1977 ng/mL — ABNORMAL HIGH (ref 24–336)

## 2017-02-02 LAB — DIRECT ANTIGLOBULIN TEST (NOT AT ARMC)
DAT, IgG: NEGATIVE
DAT, complement: NEGATIVE

## 2017-02-02 LAB — FOLATE: Folate: 10.5 ng/mL (ref >5.9)

## 2017-02-02 LAB — VITAMIN B12: Vitamin B-12: 238 pg/mL (ref 180–914)

## 2017-02-02 MED ORDER — SODIUM CHLORIDE 0.9 % IV SOLN
25.0000 mg | INTRAVENOUS | Status: DC | PRN
  Filled 2017-02-02: qty 0.5

## 2017-02-02 MED ORDER — HYDROMORPHONE HCL 1 MG/ML IJ SOLN
2.0000 mg | INTRAMUSCULAR | Status: AC
  Administered 2017-02-02: 2 mg via INTRAVENOUS
  Filled 2017-02-02: qty 2

## 2017-02-02 MED ORDER — OXYCODONE-ACETAMINOPHEN 10-325 MG PO TABS: 1.0000 | ORAL_TABLET | Freq: Four times a day (QID) | ORAL | Status: DC | PRN

## 2017-02-02 MED ORDER — SENNOSIDES-DOCUSATE SODIUM 8.6-50 MG PO TABS
1.0000 | ORAL_TABLET | Freq: Two times a day (BID) | ORAL | Status: DC
  Administered 2017-02-02 – 2017-02-05 (×6): 1 via ORAL
  Filled 2017-02-02 (×7): qty 1

## 2017-02-02 MED ORDER — ALPRAZOLAM 1 MG PO TABS
1.0000 mg | ORAL_TABLET | Freq: Two times a day (BID) | ORAL | Status: DC
  Administered 2017-02-02 – 2017-02-04 (×3): 1 mg via ORAL
  Filled 2017-02-02: qty 1
  Filled 2017-02-02 (×4): qty 2
  Filled 2017-02-02: qty 4
  Filled 2017-02-02: qty 2

## 2017-02-02 MED ORDER — SODIUM CHLORIDE 0.9% FLUSH: 9.0000 mL | INTRAVENOUS | Status: DC | PRN

## 2017-02-02 MED ORDER — PROMETHAZINE HCL 25 MG PO TABS
25.0000 mg | ORAL_TABLET | ORAL | Status: DC | PRN
  Administered 2017-02-03: 25 mg via ORAL
  Filled 2017-02-02 (×3): qty 1

## 2017-02-02 MED ORDER — HYDROMORPHONE HCL 1 MG/ML IJ SOLN: 2.0000 mg | INTRAMUSCULAR | Status: AC

## 2017-02-02 MED ORDER — DIPHENHYDRAMINE HCL 50 MG/ML IJ SOLN
25.0000 mg | Freq: Once | INTRAMUSCULAR | Status: AC
  Administered 2017-02-02: 25 mg via INTRAVENOUS
  Filled 2017-02-02: qty 1

## 2017-02-02 MED ORDER — IBUPROFEN 800 MG PO TABS
800.0000 mg | ORAL_TABLET | Freq: Four times a day (QID) | ORAL | Status: DC
  Administered 2017-02-02 – 2017-02-04 (×4): 800 mg via ORAL
  Filled 2017-02-02 (×4): qty 1

## 2017-02-02 MED ORDER — ENOXAPARIN SODIUM 40 MG/0.4ML ~~LOC~~ SOLN
40.0000 mg | SUBCUTANEOUS | Status: DC
  Filled 2017-02-02: qty 0.4

## 2017-02-02 MED ORDER — PANTOPRAZOLE SODIUM 40 MG PO TBEC
40.0000 mg | DELAYED_RELEASE_TABLET | Freq: Every day | ORAL | Status: DC
  Administered 2017-02-02: 40 mg via ORAL
  Filled 2017-02-02: qty 1

## 2017-02-02 MED ORDER — SODIUM CHLORIDE 0.45 % IV SOLN
INTRAVENOUS | Status: DC
  Administered 2017-02-02 – 2017-02-04 (×4): via INTRAVENOUS

## 2017-02-02 MED ORDER — IBUPROFEN 400 MG PO TABS
800.0000 mg | ORAL_TABLET | Freq: Four times a day (QID) | ORAL | Status: DC | PRN
  Filled 2017-02-02: qty 2

## 2017-02-02 MED ORDER — ONDANSETRON HCL 4 MG/2ML IJ SOLN
4.0000 mg | Freq: Four times a day (QID) | INTRAMUSCULAR | Status: DC | PRN
  Administered 2017-02-04: 4 mg via INTRAVENOUS
  Filled 2017-02-02: qty 2

## 2017-02-02 MED ORDER — DIPHENHYDRAMINE HCL 25 MG PO CAPS: 25.0000 mg | ORAL_CAPSULE | Freq: Once | ORAL | Status: DC

## 2017-02-02 MED ORDER — GUAIFENESIN-CODEINE 100-10 MG/5ML PO SOLN: 10.0000 mL | Freq: Four times a day (QID) | ORAL | Status: DC | PRN

## 2017-02-02 MED ORDER — MELOXICAM 15 MG PO TABS: 15.0000 mg | ORAL_TABLET | Freq: Every day | ORAL | Status: DC

## 2017-02-02 MED ORDER — ONDANSETRON HCL 4 MG/2ML IJ SOLN: 4.0000 mg | INTRAMUSCULAR | Status: DC | PRN

## 2017-02-02 MED ORDER — HYDROMORPHONE HCL 1 MG/ML IJ SOLN
1.0000 mg | INTRAMUSCULAR | Status: DC | PRN
  Administered 2017-02-02 (×2): 1 mg via INTRAVENOUS
  Filled 2017-02-02 (×2): qty 1

## 2017-02-02 MED ORDER — OXYCODONE-ACETAMINOPHEN 5-325 MG PO TABS
1.0000 | ORAL_TABLET | Freq: Once | ORAL | Status: AC
  Administered 2017-02-02: 1 via ORAL
  Filled 2017-02-02: qty 1

## 2017-02-02 MED ORDER — DIPHENHYDRAMINE HCL 25 MG PO CAPS
25.0000 mg | ORAL_CAPSULE | Freq: Four times a day (QID) | ORAL | Status: DC | PRN
  Administered 2017-02-02 – 2017-02-04 (×2): 50 mg via ORAL
  Filled 2017-02-02 (×2): qty 2

## 2017-02-02 MED ORDER — PANTOPRAZOLE SODIUM 40 MG IV SOLR: 40.0000 mg | Freq: Every day | INTRAVENOUS | Status: DC

## 2017-02-02 MED ORDER — HYDROMORPHONE HCL 1 MG/ML IJ SOLN
2.0000 mg | INTRAMUSCULAR | Status: AC
Start: 2017-02-02 — End: 2017-02-02
  Administered 2017-02-02 (×2): 2 mg via INTRAVENOUS
  Filled 2017-02-02: qty 2

## 2017-02-02 MED ORDER — HYDROXYUREA 500 MG PO CAPS
2000.0000 mg | ORAL_CAPSULE | Freq: Every day | ORAL | Status: DC
  Administered 2017-02-02 – 2017-02-04 (×3): 2000 mg via ORAL
  Filled 2017-02-02 (×3): qty 4

## 2017-02-02 MED ORDER — OXYCODONE HCL 5 MG PO TABS
5.0000 mg | ORAL_TABLET | Freq: Four times a day (QID) | ORAL | Status: DC | PRN
  Administered 2017-02-02: 5 mg via ORAL
  Filled 2017-02-02: qty 1

## 2017-02-02 MED ORDER — OXYCODONE HCL 5 MG PO TABS
5.0000 mg | ORAL_TABLET | Freq: Once | ORAL | Status: AC
  Administered 2017-02-02: 5 mg via ORAL
  Filled 2017-02-02: qty 1

## 2017-02-02 MED ORDER — FOLIC ACID 1 MG PO TABS
1.0000 mg | ORAL_TABLET | Freq: Every day | ORAL | Status: DC
  Administered 2017-02-02 – 2017-02-05 (×3): 1 mg via ORAL
  Filled 2017-02-02 (×4): qty 1

## 2017-02-02 MED ORDER — HYDROMORPHONE HCL 1 MG/ML IJ SOLN
2.0000 mg | INTRAMUSCULAR | Status: DC
  Filled 2017-02-02: qty 2

## 2017-02-02 MED ORDER — OXYCODONE-ACETAMINOPHEN 5-325 MG PO TABS
1.0000 | ORAL_TABLET | Freq: Four times a day (QID) | ORAL | Status: DC | PRN
Start: 2017-02-02 — End: 2017-02-02
  Administered 2017-02-02: 1 via ORAL
  Filled 2017-02-02: qty 1

## 2017-02-02 MED ORDER — HYDROMORPHONE HCL 1 MG/ML IJ SOLN: 2.0000 mg | INTRAMUSCULAR | Status: DC

## 2017-02-02 MED ORDER — POLYETHYLENE GLYCOL 3350 17 G PO PACK: 17.0000 g | PACK | Freq: Every day | ORAL | Status: DC | PRN

## 2017-02-02 MED ORDER — ONDANSETRON HCL 4 MG PO TABS: 4.0000 mg | ORAL_TABLET | ORAL | Status: DC | PRN

## 2017-02-02 MED ORDER — HYDROMORPHONE 1 MG/ML IV SOLN
INTRAVENOUS | Status: DC
  Administered 2017-02-02: 25 mg via INTRAVENOUS
  Administered 2017-02-02: 5 mg via INTRAVENOUS
  Administered 2017-02-02: 4.5 mg via INTRAVENOUS
  Administered 2017-02-03: 6 mg via INTRAVENOUS
  Administered 2017-02-03: 6.5 mg via INTRAVENOUS
  Administered 2017-02-03: 6 mg via INTRAVENOUS
  Administered 2017-02-03: 5 mg via INTRAVENOUS
  Administered 2017-02-03: 3.9 mg via INTRAVENOUS
  Administered 2017-02-03: 5.5 mg via INTRAVENOUS
  Administered 2017-02-03: 25 mg via INTRAVENOUS
  Administered 2017-02-04: 5.5 mg via INTRAVENOUS
  Administered 2017-02-04: 4 mg via INTRAVENOUS
  Administered 2017-02-04: 4.5 mg via INTRAVENOUS
  Administered 2017-02-04: 25 mg via INTRAVENOUS
  Administered 2017-02-04: 8 mg via INTRAVENOUS
  Administered 2017-02-04: 5.5 mg via INTRAVENOUS
  Administered 2017-02-04: 4 mg via INTRAVENOUS
  Administered 2017-02-05: 3.5 mg via INTRAVENOUS
  Administered 2017-02-05: 2.5 mg via INTRAVENOUS
  Administered 2017-02-05: 4 mg via INTRAVENOUS
  Filled 2017-02-02 (×4): qty 25

## 2017-02-02 MED ORDER — HYDROMORPHONE HCL 1 MG/ML IJ SOLN
1.0000 mg | Freq: Once | INTRAMUSCULAR | Status: AC
  Administered 2017-02-02: 1 mg via INTRAVENOUS

## 2017-02-02 MED ORDER — ABACAVIR-DOLUTEGRAVIR-LAMIVUD 600-50-300 MG PO TABS
1.0000 | ORAL_TABLET | Freq: Every day | ORAL | Status: DC
  Administered 2017-02-02 – 2017-02-04 (×3): 1 via ORAL
  Filled 2017-02-02 (×3): qty 1

## 2017-02-02 MED ORDER — NALOXONE HCL 0.4 MG/ML IJ SOLN: 0.4000 mg | INTRAMUSCULAR | Status: DC | PRN

## 2017-02-02 NOTE — ED Notes (Signed)
ED Provider at bedside. 

## 2017-02-02 NOTE — ED Notes (Signed)
Pt O2 sat 89% on RA. Pt placed on 2L [place].

## 2017-02-02 NOTE — ED Notes (Signed)
While RN in room pt falling asleep, arousable when called

## 2017-02-02 NOTE — ED Notes (Signed)
Iv team unable to gain access. Dr Corlis Leak at bedside with ultrasound to establish access

## 2017-02-02 NOTE — ED Notes (Signed)
Pt on RA at this time, O2 sat approx 97%. Pt more alert.

## 2017-02-02 NOTE — ED Notes (Signed)
Pt continues to itch his nose, moving his oxygen mask or [place] away from his face. Explained to pt need to keep O2 device on his face due to low O2 sats. Pt verbalized understanding.

## 2017-02-02 NOTE — ED Notes (Signed)
Pt provided with urinal, RN informed pt that he would be transferred to [facility] after IV was established, pt states " good because I need something for pain"

## 2017-02-02 NOTE — H&P (Signed)
History and Physical    Martin [name] [id number]:[id number] DOB: Oct 03, 1990 DOA: 02/02/2017   PCP: Patient, No Pcp Per   Patient coming from:  Home    Chief Complaint: Sickle cell crisis   HPI: Martin [name] is a 26 y.o. male with medical history significant for  HIV, Anxiety, and a history of sickle cell anemia, with multiple crises recently, on chronic hydroxyurea, presenting with severe pain in the right flank and shoulders, posterior chest, knees and elbows since last evening. He denies any chest pain or shortness of breath or productive cough. The patient has not had any fever or chills or night sweats. He denies any sick contacts. He reports the symptoms being typical for his sickle cell crisis. He states that change of weather recently may have contributed to this. Denies numbness, tingling or focal weakness  He denies any headaches, or vision changes. He denies any abdominal pain, nausea or vomiting. No hemoptysis or epistaxis. No significant amount of NSAIDs. No blood in urine or in stool     ED Course:  BP 121/82   Pulse 88   Temp 98.3 F (36.8 C) (Oral)   Resp 17   Ht  (1.905 m)   Wt 68 kg (150 lb)   SpO2 (!) 82%   BMI 18.75 kg/m    Received 3 rounds of pain meds with IV Dilaudid with some improvement  Received 1/2 NS, IV Benadryl  Total BIli 7.7 (avg 7-8 during crisis)  WBC 17.2  CXR ordered   Review of Systems:  As per HPI otherwise all other systems reviewed and are negative  Past Medical History:  Diagnosis Date  . Anxiety   . HIV (human immunodeficiency virus infection) (HCC)   . Sickle cell crisis (HCC)   . 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HCC)     History reviewed. No pertinent surgical history.  Social History Social History   Social History  . Marital status: Single    Spouse name: N/A  . Number of children: N/A  . Years of education: N/A   Occupational History  . Not on file.   Social History Main Topics  . Smoking status: Never Smoker  .  Smokeless tobacco: Never Used  . Alcohol use No  . Drug use: No  . Sexual activity: Yes   Other Topics Concern  . Not on file   Social History Narrative  . No narrative on file     Allergies  Allergen Reactions  . Ketorolac Swelling    Patient reports facial edema and left arm edema after administration.    History reviewed. No pertinent family history.    Prior to Admission medications   Medication Sig Start Date End Date Taking? Authorizing Provider  ALPRAZolam Prudy Feeler) 1 MG tablet Take 1 mg by mouth 2 (two) times daily. 12/03/16  Yes [provider]  guaiFENesin-codeine 100-10 MG/5ML syrup Take 10 mLs by mouth 4 (four) times daily as needed for cough.  01/01/17  Yes [provider]  hydroxyurea (HYDREA) 500 MG capsule Take 2,000 mg by mouth at bedtime. 12/28/15  Yes [provider]  ibuprofen (ADVIL,MOTRIN) 200 MG tablet Take 800 mg by mouth every 6 (six) hours as needed for moderate pain.   Yes [provider]  oxyCODONE-acetaminophen (PERCOCET) 10-325 MG tablet Take 1 tablet by mouth every 6 (six) hours as needed for pain.  01/05/17  Yes [provider]  TRIUMEQ 600-50-300 MG tablet Take 1 tablet by mouth at bedtime.  12/22/16  Yes [provider]    Physical Exam:  Vitals:   02/02/17 0530 02/02/17 0615 02/02/17 0630 02/02/17 0645  BP: 113/73 114/89 114/73 121/82  Pulse: 77 71 75 88  Resp: Temp:      TempSrc:      SpO2: 99% 100% (!) 86% (!) 82%  Weight:      Height:       Constitutional: NAD, but ill appearing due to pain crisis  Eyes: PERRL, lids and conjunctivae mildly icteric ENMT: Mucous membranes are moist, without exudate or lesions  Neck: normal, supple, no masses, no thyromegaly Respiratory: clear to auscultation bilaterally, no wheezing, no crackles. Normal respiratory effort  Cardiovascular: Regular rate and rhythm,  murmur, rubs or gallops. No extremity edema. 2+ pedal pulses. No carotid  bruits.  Abdomen: Soft, non tender, No hepatosplenomegaly. Bowel sounds positive.  Musculoskeletal: no clubbing / cyanosis. Moves all extremities. Cannot reproduce sickle cell pain  Skin:  Mildly jaundiced, No lesions.  Neurologic: Sensation intact  Strength equal in all extremities Psychiatric:   Alert and oriented x 3. Mildly anxious    Labs on Admission: I have personally reviewed following labs and imaging studies  CBC:  Recent Labs Lab 01/27/17 0445 02/01/17 1905  WBC 13.6* 17.2*  NEUTROABS 6.8 12.9*  HGB 9.1* 9.9*  HCT 24.3* 27.8*  MCV 111.0* 112.6*  PLT 423* 400    Basic Metabolic Panel:  Recent Labs Lab 01/27/17 0445 02/01/17 1905  NA 141 140  K 3.8 3.9  CL 109 108  CO2 25 22  GLUCOSE 89 85  BUN 7 <5*  CREATININE 0.64 0.82  CALCIUM 9.1 9.1    GFR: Estimated Creatinine Clearance: 130.1 mL/min (by C-G formula based on SCr of 0.82 mg/dL).  Liver Function Tests:  Recent Labs Lab 01/27/17 0445 02/01/17 1905  AST 38 50*  ALT 26 28  ALKPHOS 57 57  BILITOT 8.8* 7.7*  PROT 8.4* 8.4*  ALBUMIN 4.0 4.0   No results for input(s): LIPASE, AMYLASE in the last 168 hours. No results for input(s): AMMONIA in the last 168 hours.  Coagulation Profile: No results for input(s): INR, PROTIME in the last 168 hours.  Cardiac Enzymes: No results for input(s): CKTOTAL, CKMB, CKMBINDEX, TROPONINI in the last 168 hours.  BNP (last 3 results) No results for input(s): PROBNP in the last 8760 hours.  HbA1C: No results for input(s): HGBA1C in the last 72 hours.  CBG: No results for input(s): GLUCAP in the last 168 hours.  Lipid Profile: No results for input(s): CHOL, HDL, LDLCALC, [name], CHOLHDL, LDLDIRECT in the last 72 hours.  Thyroid Function Tests: No results for input(s): TSH, T4TOTAL, FREET4, T3FREE, THYROIDAB in the last 72 hours.  Anemia Panel:  Recent Labs  02/01/17 1905  RETICCTPCT >23.0*    Urine analysis:    Component Value Date/Time    COLORURINE AMBER BIOCHEMICALS MAY BE AFFECTED BY COLOR (A) 05/21/2009 1944   APPEARANCEUR CLOUDY (A) 05/21/2009 1944   LABSPEC 1.013 05/21/2009 1944   PHURINE 5.5 05/21/2009 1944   GLUCOSEU NEGATIVE 05/21/2009 1944   HGBUR NEGATIVE 05/21/2009 1944   BILIRUBINUR SMALL (A) 05/21/2009 1944   KETONESUR NEGATIVE 05/21/2009 1944   PROTEINUR NEGATIVE 05/21/2009 1944   UROBILINOGEN 4.0 (H) 05/21/2009 1944   NITRITE NEGATIVE 05/21/2009 1944   LEUKOCYTESUR TRACE (A) 05/21/2009 1944    Sepsis Labs: (procalcitonin:4,lacticidven:4) )No results found for this or any previous visit (from the past 240 hour(s)).   Radiological  Exams on Admission: No results found.  EKG: Independently reviewed.  Assessment/Plan Active Problems:   Sickle cell disease with crisis (HCC)   Generalized anxiety disorder   GERD (gastroesophageal reflux disease)   HIV (human immunodeficiency virus infection) (HCC)   Transfusion hemosiderosis   Sickle cell crisis, this is recurrent, last crisis on 01/27/2017. Hb today 9.9 at baseline, MCV 112 , Total BIli 7.7 (avg 7-8 during crisis) WBC 17.2 (likely reactive) . Afebrile.  Received 3 rounds of pain meds with IV Dilaudid  at 2 mg q 30 min with some improvement  Received 1/2 NS, IV Benadryl  Admit to Medsurg Obs  Check DAT, LDH, Haptoglobin, fractionated bilirubin to check for indirect bili abnormalities  Anemia panel with Ferritin levels   Monitor CBC with diff daily and CMET  Transfuse 2 units for Hb less than 7   Folic Acid at 1 mg daily   Continue IVF with 1/2 NS 150 ml/h until hydrated, then can be discontinued.  Hydrea 2000 mg daily, may need to be revised in view of frequent crisis Oxycodone 20 mg po q 4 prn  Continue Motrin for now prn, no Tylenol due to elevated Bili IV pain meds with Dilaudid 1 mg IV q 2- 3 prn as pain is now improving       HIV disease Continue Truimeq Check CD4 and HIV quant Follwed at [facility]  Anxiety Continue home  Xanax bid prn   GERD, no acute symptoms  PPI     DVT prophylaxis:  Xarelto Code Status:    Full Family Communication:  Discussed with patient Disposition Plan: Expect patient to be discharged to home after condition improves Consults called:    None  Admission status:  Medsurg Obs at [facility], Dr. Jerilynn Birkenhead, PA-C [facility]   02/02/2017, 6:57 AM

## 2017-02-02 NOTE — ED Provider Notes (Signed)
Emergency Ultrasound:  US Guidance for needle guidance  Performed by Dr. Corlis Leak Indication: IV access Linear probe used in real-time to visualize location of needle entry through skin. Interpretation: IV inserted into peripheral vein. Images not archived electronically.   Martin [name] [name] 11:36 AM    Abelino [name], MD 02/02/17 1136

## 2017-02-02 NOTE — ED Notes (Signed)
Pt O2 sat decreased to 89% on RA after ordered dilaudid administration. Pt placed back on simple O2 mask at 2 L. Pt O2 sat increased to 96-99%.

## 2017-02-02 NOTE — ED Notes (Signed)
IV team at bedside 

## 2017-02-02 NOTE — ED Notes (Addendum)
Attempted to gain IV access, twice, without success. Other RN also attempted, with no success. IV Team consult placed.

## 2017-02-02 NOTE — Progress Notes (Signed)
SICKLE CELL SERVICE PROGRESS NOTE  Martin [name] UJW:[id number] DOB: 1990-05-08 DOA: 02/02/2017 PCP: Patient, No Pcp Per  Assessment/Plan: Active Problems:   Sickle cell disease with crisis (HCC)   Generalized anxiety disorder   GERD (gastroesophageal reflux disease)   HIV (human immunodeficiency virus infection) (HCC)   Transfusion hemosiderosis   Sickle cell crisis (HCC)  1. Hb Martin [name] with Crisis: Start on Dilaudid PCA at bolus of 0.5 mg. Continue Ibuprofen on a scheduled basis.  2. HIV Disease:Continue on Antiretroviral.  3. Anemia of Chronic Disease: Hb stable according to patients report of baseline Hb.  4. Leukocytosis: Likely related to crisis. No evidence of infection.  5. DVT Prophylaxis: Lovenox.   Code Status: Full Code Family Communication: N/A Disposition Plan: Not yet ready for discharge  Martin [name] A.  Pager [phone]. If 7PM-7AM, please contact night-coverage.  02/02/2017, 5:26 PM  LOS: 0 days   Interim History: Pt states that he usually takes 2-3 Percocet daily which keeps his pain at about an intensity of 3-4/10. Pt states that his oral medication can ususally manage pain up to 6-7/10 and then he requires hospital care. He has an allergy to Toradol but can tolerate Ibuprofen. Reports that his pattern of BM is 2-3 x week. Pt states that he is followed by Hematology and ID at the [facility] in [facility] and that his last viral load was undetectable and CD4 count was >5000.   Consultants:  None  Procedures:  None  Antibiotics:  None    Objective: Vitals:   02/02/17 0945 02/02/17 1141 02/02/17 1148 02/02/17 1248  BP: 95/66 102/71 102/71 103/65  Pulse: 67 71  68  Resp: Temp:  98.3 F (36.8 C)  97.8 F (36.6 C)  TempSrc:    Oral  SpO2: 98% 98%    Weight:    67.9 kg (149 lb 11.1 oz)  Height:     (1.905 m)   Weight change:  No intake or output data in the 24 hours ending 02/02/17 1726    Physical Exam    Data  Reviewed: Basic Metabolic Panel:  Recent Labs Lab 01/27/17 0445 02/01/17 1905  NA 141 140  K 3.8 3.9  CL 109 108  CO2 25 22  GLUCOSE 89 85  BUN 7 <5*  CREATININE 0.64 0.82  CALCIUM 9.1 9.1   Liver Function Tests:  Recent Labs Lab 01/27/17 0445 02/01/17 1905 02/02/17 0800  AST 38 50*  --   ALT 26 28  --   ALKPHOS 57 57  --   BILITOT 8.8* 7.7* 7.2*  PROT 8.4* 8.4*  --   ALBUMIN 4.0 4.0  --    No results for input(s): LIPASE, AMYLASE in the last 168 hours. No results for input(s): AMMONIA in the last 168 hours. CBC:  Recent Labs Lab 01/27/17 0445 02/01/17 1905  WBC 13.6* 17.2*  NEUTROABS 6.8 12.9*  HGB 9.1* 9.9*  HCT 24.3* 27.8*  MCV 111.0* 112.6*  PLT 423* 400   Cardiac Enzymes: No results for input(s): CKTOTAL, CKMB, CKMBINDEX, TROPONINI in the last 168 hours. BNP (last 3 results) No results for input(s): BNP in the last 8760 hours.  ProBNP (last 3 results) No results for input(s): PROBNP in the last 8760 hours.  CBG: No results for input(s): GLUCAP in the last 168 hours.  No results found for this or any previous visit (from the past 240 hour(s)).   Studies: No results found.  Scheduled Meds: .  abacavir-dolutegravir-lamiVUDine  1 tablet Oral QHS  . ALPRAZolam  1 mg Oral BID  . enoxaparin (LOVENOX) injection  40 mg Subcutaneous Q24H  . folic acid  1 mg Oral Daily  . HYDROmorphone   Intravenous Q4H  .  HYDROmorphone (DILAUDID) injection  1 mg Intravenous Once  . hydroxyurea  2,000 mg Oral QHS  . ibuprofen  800 mg Oral Q6H  . oxyCODONE-acetaminophen  1 tablet Oral Once   And  . oxyCODONE  5 mg Oral Once  . senna-docusate  1 tablet Oral BID   Continuous Infusions: . sodium chloride Stopped (02/02/17 1008)  . diphenhydrAMINE (BENADRYL) IVPB(SICKLE CELL ONLY)      Active Problems:   Sickle cell disease with crisis (HCC)   Generalized anxiety disorder   GERD (gastroesophageal reflux disease)   HIV (human immunodeficiency virus infection)  (HCC)   Transfusion hemosiderosis   Sickle cell crisis (HCC)    In excess of 25 minutes spent during this visit. Greater than 50% involved face to face contact with the patient for assessment, counseling and coordination of care.

## 2017-02-02 NOTE — ED Provider Notes (Signed)
[facility] EMERGENCY DEPARTMENT Provider Note   CSN: [id number] Arrival date & time: 02/01/17  1814     History   Chief Complaint Chief Complaint  Patient presents with  . Sickle Cell Pain Crisis    HPI Martin [name] is a 26 y.o. male.  Patient with history of sickle cell anemia presents to the ER with sickle cell crisis. Patient reports that he has been experiencing pain since yesterday. Pain is not improved with his home meds. He reports diffuse pain in his back, all of his joints in his extremities. No chest pain or shortness of breath. He has not had fever. Symptoms are typical for his sickle cell crisis.      Past Medical History:  Diagnosis Date  . Sickle cell crisis (HCC)     There are no active problems to display for this patient.   History reviewed. No pertinent surgical history.     Home Medications    Prior to Admission medications   Medication Sig Start Date End Date Taking? Authorizing Provider  ALPRAZolam Prudy Feeler) 1 MG tablet Take 1 mg by mouth 2 (two) times daily. 12/03/16  Yes [provider]  guaiFENesin-codeine 100-10 MG/5ML syrup Take 10 mLs by mouth 4 (four) times daily as needed for cough.  01/01/17  Yes [provider]  hydroxyurea (HYDREA) 500 MG capsule Take 2,000 mg by mouth at bedtime. 12/28/15  Yes [provider]  ibuprofen (ADVIL,MOTRIN) 200 MG tablet Take 800 mg by mouth every 6 (six) hours as needed for moderate pain.   Yes [provider]  oxyCODONE-acetaminophen (PERCOCET) 10-325 MG tablet Take 1 tablet by mouth every 6 (six) hours as needed for pain.  01/05/17  Yes [provider]  TRIUMEQ 600-50-300 MG tablet Take 1 tablet by mouth at bedtime.  12/22/16  Yes [provider]    Family History No family history on file.  Social History Social History  Substance Use Topics  . Smoking status: Never Smoker  . Smokeless tobacco: Never Used  . Alcohol use No       Allergies   Ketorolac   Review of Systems Review of Systems  Cardiovascular: Negative for chest pain.  Musculoskeletal: Positive for arthralgias and back pain.  All other systems reviewed and are negative.    Physical Exam Updated Vital Signs BP 113/73   Pulse 77   Temp 98.3 F (36.8 C) (Oral)   Resp 12   Ht  (1.905 m)   Wt 68 kg (150 lb)   SpO2 99%   BMI 18.75 kg/m   Physical Exam  Constitutional: He is oriented to person, place, and time. He appears well-developed and well-nourished. No distress.  HENT:  Head: Normocephalic and atraumatic.  Right Ear: Hearing normal.  Left Ear: Hearing normal.  Nose: Nose normal.  Mouth/Throat: Oropharynx is clear and moist and mucous membranes are normal.  Eyes: Pupils are equal, round, and reactive to light. Conjunctivae and EOM are normal.  Neck: Normal range of motion. Neck supple.  Cardiovascular: Regular rhythm, S1 normal and S2 normal.  Exam reveals no gallop and no friction rub.   No murmur heard. Pulmonary/Chest: Effort normal and breath sounds normal. No respiratory distress. He exhibits no tenderness.  Abdominal: Soft. Normal appearance and bowel sounds are normal. There is no hepatosplenomegaly. There is no tenderness. There is no rebound, no guarding, no tenderness at McBurney's point and negative Murphy's sign. No hernia.  Musculoskeletal: Normal range of motion.  Neurological: He is alert and oriented to person, place, and time. He has normal strength. No cranial nerve deficit or sensory deficit. Coordination normal. GCS eye subscore is 4. GCS verbal subscore is 5. GCS motor subscore is 6.  Skin: Skin is warm, dry and intact. No rash noted. No cyanosis.  Psychiatric: He has a normal mood and affect. His speech is normal and behavior is normal. Thought content normal.  Nursing note and vitals reviewed.    ED Treatments / Results  Labs (all labs ordered are listed, but only abnormal results are  displayed) Labs Reviewed  COMPREHENSIVE METABOLIC PANEL - Abnormal; Notable for the following:       Result Value   BUN <5 (*)    Total Protein 8.4 (*)    AST 50 (*)    Total Bilirubin 7.7 (*)    All other components within normal limits  CBC WITH DIFFERENTIAL/PLATELET - Abnormal; Notable for the following:    WBC 17.2 (*)    RBC 2.47 (*)    Hemoglobin 9.9 (*)    HCT 27.8 (*)    MCV 112.6 (*)    MCH 40.1 (*)    RDW 24.5 (*)    Neutro Abs 12.9 (*)    All other components within normal limits  RETICULOCYTES - Abnormal; Notable for the following:    Retic Ct Pct >23.0 (*)    RBC. 2.47 (*)    All other components within normal limits    EKG  EKG Interpretation None       Radiology No results found.  Procedures Procedures (including critical care time)  Medications Ordered in ED Medications  oxyCODONE-acetaminophen (PERCOCET/ROXICET) 5-325 MG per tablet 2 tablet (2 tablets Oral Given 02/01/17 1829)  oxyCODONE-acetaminophen (PERCOCET/ROXICET) 5-325 MG per tablet (not administered)  0.45 % sodium chloride infusion ( Intravenous New Bag/Given 02/02/17 0309)  HYDROmorphone (DILAUDID) injection 2 mg (0 mg Intravenous Hold 02/02/17 0407)    Or  HYDROmorphone (DILAUDID) injection 2 mg ( Subcutaneous See Alternative 02/02/17 0407)  HYDROmorphone (DILAUDID) injection 2 mg (2 mg Intravenous Given 02/02/17 0438)    Or  HYDROmorphone (DILAUDID) injection 2 mg ( Subcutaneous See Alternative 02/02/17 0438)  promethazine (PHENERGAN) tablet 25 mg (not administered)  HYDROmorphone (DILAUDID) injection 2 mg (2 mg Intravenous Given 02/02/17 0303)    Or  HYDROmorphone (DILAUDID) injection 2 mg ( Subcutaneous See Alternative 02/02/17 0303)  HYDROmorphone (DILAUDID) injection 2 mg (2 mg Intravenous Given 02/02/17 0337)    Or  HYDROmorphone (DILAUDID) injection 2 mg ( Subcutaneous See Alternative 02/02/17 0337)  diphenhydrAMINE (BENADRYL) injection 25 mg (25 mg Intravenous Given 02/02/17  0303)  diphenhydrAMINE (BENADRYL) injection 25 mg (25 mg Intravenous Given 02/02/17 0438)     Initial Impression / Assessment and Plan / ED Course  I have reviewed the triage vital signs and the nursing notes.  Pertinent labs & imaging results that were available during my care of the patient were reviewed by me and considered in my medical decision making (see chart for details).     Patient presents to the emergency department for evaluation of sickle cell crisis. Patient with previous history of sickle cell anemia presents with complaints of diffuse pains consistent with previous sickle cell crisis. He is not experiencing fever, chest pain, shortness of breath, therefore no concern for acute chest. Blood work reassuring. Patient administered multiple doses of analgesia with improvement of his pain, however he is still having 7-8 out of 10 pain and does not feel like  his pain was alleviated enough for discharge. Will admit to hospitalist service.  Final Clinical Impressions(s) / ED Diagnoses   Final diagnoses:  Sickle cell pain crisis [facility])    New Prescriptions New Prescriptions   No medications on file     Gilda Crease, MD 02/02/17 [phone]

## 2017-02-02 NOTE — ED Notes (Addendum)
Pt O2 increased to 3L. O2 saturation decreased to 86% after ordered dilaudid administration.

## 2017-02-03 DIAGNOSIS — D638 Anemia in other chronic diseases classified elsewhere: Secondary | ICD-10-CM

## 2017-02-03 DIAGNOSIS — B20 Human immunodeficiency virus [HIV] disease: Secondary | ICD-10-CM

## 2017-02-03 DIAGNOSIS — D5700 Hb-SS disease with crisis, unspecified: Principal | ICD-10-CM

## 2017-02-03 DIAGNOSIS — R011 Cardiac murmur, unspecified: Secondary | ICD-10-CM

## 2017-02-03 LAB — HAPTOGLOBIN: Haptoglobin: <10 mg/dL — ABNORMAL LOW (ref 34–200)

## 2017-02-03 LAB — HIV-1 RNA QUANT-NO REFLEX-BLD
HIV 1 RNA Quant: <20 copies/mL
LOG10 HIV-1 RNA: UNDETERMINED log10copy/mL

## 2017-02-03 LAB — T-HELPER CELLS (CD4) COUNT (NOT AT ARMC)
CD4 % Helper T Cell: 31 % — ABNORMAL LOW (ref 33–55)
CD4 T Cell Abs: 1960 /uL (ref 400–2700)

## 2017-02-03 MED ORDER — FLEET ENEMA 7-19 GM/118ML RE ENEM
1.0000 | ENEMA | Freq: Once | RECTAL | Status: AC
  Administered 2017-02-03: 1 via RECTAL
  Filled 2017-02-03: qty 1

## 2017-02-03 NOTE — Progress Notes (Signed)
SICKLE CELL SERVICE PROGRESS NOTE  Martin [name] ZOX:[id number] DOB: 09/20/1990 DOA: 02/02/2017 PCP: Patient, No Pcp Per  Assessment/Plan: Active Problems:   Sickle cell disease with crisis (HCC)   Generalized anxiety disorder   GERD (gastroesophageal reflux disease)   HIV (human immunodeficiency virus infection) (HCC)   Transfusion hemosiderosis   Sickle cell crisis (HCC)  1. Hb [place] with Crisis: Continue PCA at current dose. Continue scheduled Ibuprofen and consider scheduled acetaminophen if pain not improving. [facility] patient eating and drinking well.  2. HIV Disease: Continue TRIUMEQ. CD4 and Viral load noted.  3. Anemia of Chronic Disease:Hb stable according to patients report of baseline Hb . Continue Hydrea and re-check tomorrow.  4. Heart Murmur: Pt to follow up with PMD for ECHO as an out patient to evaluate for pulmonary HTN.  Pt not from this area so will be not be following up in this area.  5. Leukocytosis: No evidence of acute infection. Will re-check tomorrow.  6. DVT Prophylaxis: Lovenox.     Code Status: Full Code Family Communication: N/A Disposition Plan: Not yet ready for discharge  Deanta Mincey A.  Pager [phone]. If 7PM-7AM, please contact night-coverage.  02/03/2017, 2:50 PM  LOS: 1 day   Interim History: Pt much more comfortable appearing today than yesterday. He reports pain at 7/1o and localized to shoulders, low back, hips and knees. He has not maximized use of the PCA. He is hoping to be discharged in the next 24 -48 hours depending on progress.   Consultants:  None  Procedures:  None  Antibiotics:  None   Objective: Vitals:   02/03/17 0901 02/03/17 1100 02/03/17 1229 02/03/17 1306  BP:  108/68    Pulse:  75    Resp: 10 (!) 9 (!) 7 14  Temp:      TempSrc:  Oral    SpO2: 93% 95% 95% 94%  Weight:      Height:       Weight change: -0.139 kg (-4.9 oz)  Intake/Output Summary (Last 24 hours) at 02/03/17  1450 Last data filed at 02/03/17 1145  Gross per 24 hour  Intake              120 ml  Output             2650 ml  Net            -2530 ml      Physical Exam General: Alert, awake, oriented x3, in no acute distress.  HEENT: [place]/AT PEERL, EOMI, anicteric Neck: Trachea midline,  no masses, no thyromegal,y no JVD, no carotid bruit OROPHARYNX:  Moist, No exudate/ erythema/lesions.  Heart: Regular rate and rhythm, II/VI  Non-radiating systolic ejection murmur at base. No rubs or gallops, PMI non-displaced, no heaves or thrills on palpation.  Lungs: Clear to auscultation, no wheezing or rhonchi noted. No increased vocal fremitus resonant to percussion  Abdomen: Soft, nontender, nondistended, positive bowel sounds, no masses no hepatosplenomegaly noted.  Neuro: No focal neurological deficits noted cranial nerves II through XII grossly intact. Strength at baseline in bilateral upper and lower extremities. Musculoskeletal: No warmth swelling or erythema around joints, no spinal tenderness noted. Psychiatric: Patient alert and oriented x3, good insight and cognition, good recent to remote recall.    Data Reviewed: Basic Metabolic Panel:  Recent Labs Lab 02/01/17 1905  NA 140  K 3.9  CL 108  CO2 22  GLUCOSE 85  BUN <5*  CREATININE 0.82  CALCIUM  9.1   Liver Function Tests:  Recent Labs Lab 02/01/17 1905 02/02/17 0800  AST 50*  --   ALT 28  --   ALKPHOS 57  --   BILITOT 7.7* 7.2*  PROT 8.4*  --   ALBUMIN 4.0  --    No results for input(s): LIPASE, AMYLASE in the last 168 hours. No results for input(s): AMMONIA in the last 168 hours. CBC:  Recent Labs Lab 02/01/17 1905  WBC 17.2*  NEUTROABS 12.9*  HGB 9.9*  HCT 27.8*  MCV 112.6*  PLT 400   Cardiac Enzymes: No results for input(s): CKTOTAL, CKMB, CKMBINDEX, TROPONINI in the last 168 hours. BNP (last 3 results) No results for input(s): BNP in the last 8760 hours.  ProBNP (last 3 results) No results for input(s):  PROBNP in the last 8760 hours.  CBG: No results for input(s): GLUCAP in the last 168 hours.  No results found for this or any previous visit (from the past 240 hour(s)).   Studies: No results found.  Scheduled Meds: . abacavir-dolutegravir-lamiVUDine  1 tablet Oral QHS  . ALPRAZolam  1 mg Oral BID  . enoxaparin (LOVENOX) injection  40 mg Subcutaneous Q24H  . folic acid  1 mg Oral Daily  . HYDROmorphone   Intravenous Q4H  . hydroxyurea  2,000 mg Oral QHS  . ibuprofen  800 mg Oral Q6H  . senna-docusate  1 tablet Oral BID   Continuous Infusions: . sodium chloride 150 mL/hr at 02/03/17 1306  . diphenhydrAMINE (BENADRYL) IVPB(SICKLE CELL ONLY)      Active Problems:   Sickle cell disease with crisis (HCC)   Generalized anxiety disorder   GERD (gastroesophageal reflux disease)   HIV (human immunodeficiency virus infection) (HCC)   Transfusion hemosiderosis   Sickle cell crisis (HCC)    In excess of 35 minutes spent during this visit. Greater than 50% involved face to face contact with the patient for assessment, counseling and coordination of care.

## 2017-02-04 LAB — CBC WITH DIFFERENTIAL/PLATELET
Basophils Absolute: 0 10*3/uL (ref 0.0–0.1)
Basophils Relative: 0 %
Eosinophils Absolute: 0 10*3/uL (ref 0.0–0.7)
Eosinophils Relative: 0 %
HCT: 22.8 % — ABNORMAL LOW (ref 39.0–52.0)
Hemoglobin: 8.3 g/dL — ABNORMAL LOW (ref 13.0–17.0)
Lymphocytes Relative: 54 %
Lymphs Abs: 5.6 10*3/uL — ABNORMAL HIGH (ref 0.7–4.0)
MCH: 39.7 pg — ABNORMAL HIGH (ref 26.0–34.0)
MCHC: 36.4 g/dL — ABNORMAL HIGH (ref 30.0–36.0)
MCV: 109.1 fL — ABNORMAL HIGH (ref 78.0–100.0)
Monocytes Absolute: 0.6 10*3/uL (ref 0.1–1.0)
Monocytes Relative: 6 %
Neutro Abs: 4.1 10*3/uL (ref 1.7–7.7)
Neutrophils Relative %: 40 %
Platelets: 318 10*3/uL (ref 150–400)
RBC: 2.09 MIL/uL — ABNORMAL LOW (ref 4.22–5.81)
RDW: 22.9 % — ABNORMAL HIGH (ref 11.5–15.5)
WBC: 10.3 10*3/uL (ref 4.0–10.5)
nRBC: 15 /100 WBC — ABNORMAL HIGH

## 2017-02-04 LAB — RETICULOCYTES
RBC.: 2.09 MIL/uL — ABNORMAL LOW (ref 4.22–5.81)
Retic Ct Pct: >23 % — ABNORMAL HIGH (ref 0.4–3.1)

## 2017-02-04 LAB — BASIC METABOLIC PANEL
Anion gap: 11 (ref 5–15)
BUN: 5 mg/dL — ABNORMAL LOW (ref 6–20)
CO2: 25 mmol/L (ref 22–32)
Calcium: 9.5 mg/dL (ref 8.9–10.3)
Chloride: 109 mmol/L (ref 101–111)
Creatinine, Ser: 0.76 mg/dL (ref 0.61–1.24)
GFR calc Af Amer: >60 mL/min (ref >60)
GFR calc non Af Amer: >60 mL/min (ref >60)
Glucose, Bld: 88 mg/dL (ref 65–99)
Potassium: 4.1 mmol/L (ref 3.5–5.1)
Sodium: 145 mmol/L (ref 135–145)

## 2017-02-04 MED ORDER — OXYCODONE HCL 5 MG PO TABS
20.0000 mg | ORAL_TABLET | ORAL | Status: DC
  Administered 2017-02-04 – 2017-02-05 (×5): 20 mg via ORAL
  Filled 2017-02-04 (×5): qty 4

## 2017-02-04 MED ORDER — DIPHENHYDRAMINE HCL 25 MG PO CAPS: 25.0000 mg | ORAL_CAPSULE | Freq: Four times a day (QID) | ORAL | Status: DC | PRN

## 2017-02-04 MED ORDER — LORAZEPAM 2 MG/ML IJ SOLN: 0.5000 mg | Freq: Once | INTRAMUSCULAR | Status: DC

## 2017-02-04 MED ORDER — SODIUM CHLORIDE 0.9 % IV SOLN
25.0000 mg | INTRAVENOUS | Status: DC | PRN
  Filled 2017-02-04: qty 0.5

## 2017-02-04 MED ORDER — OXYCODONE-ACETAMINOPHEN 5-325 MG PO TABS: 1.0000 | ORAL_TABLET | ORAL | Status: DC

## 2017-02-04 MED ORDER — SORBITOL 70 % SOLN
960.0000 mL | TOPICAL_OIL | Freq: Once | ORAL | Status: DC
  Filled 2017-02-04: qty 473

## 2017-02-04 MED ORDER — OXYCODONE HCL 5 MG PO TABS: 5.0000 mg | ORAL_TABLET | ORAL | Status: DC

## 2017-02-04 MED ORDER — ACETAMINOPHEN 500 MG PO TABS
1000.0000 mg | ORAL_TABLET | ORAL | Status: DC
  Administered 2017-02-04 – 2017-02-05 (×4): 1000 mg via ORAL
  Filled 2017-02-04 (×5): qty 2

## 2017-02-04 NOTE — Progress Notes (Addendum)
SICKLE CELL SERVICE PROGRESS NOTE  Martin [name] ZOX:[id number] DOB: 02/24/1991 DOA: 02/02/2017 PCP: Patient, No Pcp Per  Assessment/Plan: Active Problems:   Sickle cell disease with crisis (HCC)   Generalized anxiety disorder   GERD (gastroesophageal reflux disease)   HIV (human immunodeficiency virus infection) (HCC)   Transfusion hemosiderosis   Sickle cell crisis (HCC)   Heart murmur  1. Hb [name] with Crisis: Continue PCA at current dose. Continue scheduled Ibuprofen and consider scheduled acetaminophen if pain not improving. [facility] as patient eating and drinking well.  2. Chronic Hypoxia: Continue Oxygen as previously prescribed (3 L/min) at night. Oxygen levels at baseline. 3. Clubbing of Digits: Reason unclear. Pt could have some bronchiectasis or some degree of Pulmonary Hypertension.However since this is not acute and his [facility] is in [place] I will defer to his Primary Care Physician.  4. HIV Disease: Continue TRIUMEQ. CD4 and Viral load noted.  5. Anemia of Chronic Disease:Hb stable according to patients report of baseline Hb . Continue Hydrea.  6. Heart Murmur: Pt to follow up with PMD for ECHO as an out patient to evaluate for pulmonary HTN.  Pt not from this area so will be not be following up in this area.  7. Leukocytosis: Resolved. 8. DVT Prophylaxis: Patient has been refusing Lovenox. Will order SCD's.    Code Status: Full Code Family Communication: N/A Disposition Plan: Anticipate discharge in 24-48 hours.  Ilyaas Musto A.  Pager [phone]. If 7PM-7AM, please contact night-coverage.  02/04/2017, 3:58 PM  LOS: 2 days   Interim History: Pt states that his baseline saturation is normally 89% -90 and he uses Oxygen at nights at 3 L/min. Pt is unsure what diagnosis causes him to require Oxygen. He has had a sleep but he was not prescribed a CPAP machine.   Today pt reports pain at 6- 7/10 and localized to shoulders, low back, hips and knees.  He has used 33 mg of Dilaudid  with 86/33:demands/delivereis on the PCA in the last 24 hours. Pt hoping to go home tomorrow.   Consultants:  None  Procedures:  None  Antibiotics:  None   Objective: Vitals:   02/04/17 0500 02/04/17 0759 02/04/17 1152 02/04/17 1537  BP: 113/71     Pulse: 85     Resp: 10 (!) 9 10 (!) 8  Temp: 98.2 F (36.8 C)     TempSrc: Oral     SpO2: 94% 94% 94% 95%  Weight:      Height:       Weight change:   Intake/Output Summary (Last 24 hours) at 02/04/17 1558 Last data filed at 02/04/17 0525  Gross per 24 hour  Intake           5692.5 ml  Output             1200 ml  Net           4492.5 ml      Physical Exam General: Alert, awake, oriented x3, in no acute distress.  HEENT: [place]/AT PEERL, EOMI, anicteric Heart: Regular rate and rhythm, II/VI  Non-radiating systolic ejection murmur at base. No rubs or gallops, PMI non-displaced, no heaves or thrills on palpation.  Lungs: Clear to auscultation, no wheezing or rhonchi noted. No increased vocal fremitus resonant to percussion  Abdomen: Soft, nontender, nondistended, positive bowel sounds, no masses no hepatosplenomegaly noted.  Neuro: No focal neurological deficits noted cranial nerves II through XII grossly intact. Strength at baseline in bilateral  upper and lower extremities. Musculoskeletal: No warmth swelling or erythema around joints, no spinal tenderness noted. Pt has clubbing of digits in hands.  Psychiatric: Patient alert and oriented x3, good insight and cognition, good recent to remote recall.    Data Reviewed: Basic Metabolic Panel:  Recent Labs Lab 02/01/17 1905 02/04/17 0442  NA 140 145  K 3.9 4.1  CL 108 109  CO2 22 25  GLUCOSE 85 88  BUN <5* 5*  CREATININE 0.82 0.76  CALCIUM 9.1 9.5   Liver Function Tests:  Recent Labs Lab 02/01/17 1905 02/02/17 0800  AST 50*  --   ALT 28  --   ALKPHOS 57  --   BILITOT 7.7* 7.2*  PROT 8.4*  --   ALBUMIN 4.0  --    No  results for input(s): LIPASE, AMYLASE in the last 168 hours. No results for input(s): AMMONIA in the last 168 hours. CBC:  Recent Labs Lab 02/01/17 1905 02/04/17 0442  WBC 17.2* 10.3  NEUTROABS 12.9* 4.1  HGB 9.9* 8.3*  HCT 27.8* 22.8*  MCV 112.6* 109.1*  PLT 400 318   Cardiac Enzymes: No results for input(s): CKTOTAL, CKMB, CKMBINDEX, TROPONINI in the last 168 hours. BNP (last 3 results) No results for input(s): BNP in the last 8760 hours.  ProBNP (last 3 results) No results for input(s): PROBNP in the last 8760 hours.  CBG: No results for input(s): GLUCAP in the last 168 hours.  No results found for this or any previous visit (from the past 240 hour(s)).   Studies: No results found.  Scheduled Meds: . abacavir-dolutegravir-lamiVUDine  1 tablet Oral QHS  . ALPRAZolam  1 mg Oral BID  . enoxaparin (LOVENOX) injection  40 mg Subcutaneous Q24H  . folic acid  1 mg Oral Daily  . HYDROmorphone   Intravenous Q4H  . hydroxyurea  2,000 mg Oral QHS  . ibuprofen  800 mg Oral Q6H  . senna-docusate  1 tablet Oral BID   Continuous Infusions: . sodium chloride 20 mL/hr at 02/04/17 0505  . diphenhydrAMINE (BENADRYL) IVPB(SICKLE CELL ONLY)      Active Problems:   Sickle cell disease with crisis (HCC)   Generalized anxiety disorder   GERD (gastroesophageal reflux disease)   HIV (human immunodeficiency virus infection) (HCC)   Transfusion hemosiderosis   Sickle cell crisis (HCC)   Heart murmur    In excess of 25 minutes spent during this visit. Greater than 50% involved face to face contact with the patient for assessment, counseling and coordination of care.     Addendum Records from [facility] from last admission and noted recommendations from pain management team. Will increase Oxycodone to 20 mg every 4 hours and also schedule Tylenol every 6 hours. Also noted that on the last admission he had a reticulonodular interstitial pattern in the lungs. He will need a follow up  CXR in the near future and likely a CT if pattern still exists.   Joyanna Kleman A.

## 2017-02-05 ENCOUNTER — Inpatient Hospital Stay (HOSPITAL_COMMUNITY): Payer: Medicaid - Out of State

## 2017-02-05 DIAGNOSIS — D57219 Sickle-cell/Hb-C disease with crisis, unspecified: Secondary | ICD-10-CM

## 2017-02-05 DIAGNOSIS — F411 Generalized anxiety disorder: Secondary | ICD-10-CM

## 2017-02-05 DIAGNOSIS — J9611 Chronic respiratory failure with hypoxia: Secondary | ICD-10-CM

## 2017-02-05 MED ORDER — ACETAMINOPHEN 500 MG PO TABS: 1000.0000 mg | ORAL_TABLET | Freq: Four times a day (QID) | ORAL | Status: DC

## 2017-02-05 MED ORDER — HYDROMORPHONE 1 MG/ML IV SOLN
INTRAVENOUS | Status: DC
  Filled 2017-02-05: qty 25

## 2017-02-05 NOTE — Progress Notes (Signed)
This CM spoke with pt who states that he uses [employer] for home 02 and he has a travel tank that is able to get him transported home. Sandford Crazeora Taevon Aschoff RN,BSN,NCM [phone]

## 2017-02-05 NOTE — Progress Notes (Signed)
Pt discharged home in stable condition. Discharge instructions given. Pt verbalized understanding. Pt didn't have travel oxygen but insistent on going. Stated that he was going straight to the residence where his travel oxygen was. No immediate questions or concerns at this time

## 2017-02-05 NOTE — Progress Notes (Signed)
SATURATION QUALIFICATIONS: (This note is used to comply with regulatory documentation for home oxygen)  Patient Saturations on Room Air at Rest = 80%  Patient Saturations on Room Air while Ambulating = 86%  Patient Saturations on 2 Liters of oxygen while Ambulating = 94%  Please briefly explain why patient needs home oxygen:

## 2017-02-05 NOTE — Discharge Summary (Signed)
Martin [name] MRN: [id number] DOB/AGE: 27/04/1990 27 y.o.  Admit date: 02/02/2017 Discharge date: 02/05/2017  Primary Care Physician:  Patient, No Pcp Per   Discharge Diagnoses:   Patient Active Problem List   Diagnosis Date Noted  . Heart murmur 02/03/2017  . Sickle cell disease with crisis (HCC) 02/02/2017  . 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 Sickle cell crisis (HCC) 02/02/2017  . 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HCC) 12/20/2016  . Vitamin D deficiency 08/14/2016  . High risk medication use 09/27/2014  . Generalized anxiety disorder 05/19/2014  . GERD (gastroesophageal reflux disease) 05/19/2014  . HIV (human immunodeficiency virus infection) (HCC) 05/23/2012    DISCHARGE MEDICATION: Allergies as of 02/05/2017      Reactions   Ketorolac Swelling   Patient reports facial edema and left arm edema after administration.      Medication List    TAKE these medications   ALPRAZolam 1 MG tablet Commonly known as:  XANAX Take 1 mg by mouth 2 (two) times daily.   guaiFENesin-codeine 100-10 MG/5ML syrup Take 10 mLs by mouth 4 (four) times daily as needed for cough.   hydroxyurea 500 MG capsule Commonly known as:  HYDREA Take 2,000 mg by mouth at bedtime.   ibuprofen 200 MG tablet Commonly known as:  ADVIL,MOTRIN Take 800 mg by mouth every 6 (six) hours as needed for moderate pain.   oxyCODONE-acetaminophen 10-325 MG tablet Commonly known as:  PERCOCET Take 1 tablet by mouth every 6 (six) hours as needed for pain.   TRIUMEQ 600-50-300 MG tablet Generic drug:  abacavir-dolutegravir-lamiVUDine Take 1 tablet by mouth at bedtime.         Consults: Treatment Team:  Altha HarmMatthews, Ronnette Rump A, MD   SIGNIFICANT DIAGNOSTIC STUDIES:  Dg Chest 2 View  Result Date: 02/05/2017 CLINICAL DATA:  Shortness of breath.  History of sickle cell trait. EXAM: CHEST  2 VIEW COMPARISON:  December 18, 2016 FINDINGS: Interstitial prominence in the lungs, right greater than left, similar to  previous studies. The heart size is borderline. The hila and mediastinum are normal. No pulmonary nodules or masses. No focal infiltrates. No pneumothorax. No other acute abnormalities. IMPRESSION: Chronic interstitial prominence. No acute acute abnormality or focal infiltrate identified. Electronically Signed   By: Gerome Samavid  Williams III M.D   On: 02/05/2017 12:16        No results found for this or any previous visit (from the past 240 hour(s)).  BRIEF ADMITTING H & P: Martin [name] is a 26 y.o. male with medical history significant for  HIV, Anxiety, and a history of sickle cell anemia, with multiple crises recently, on chronic hydroxyurea, presenting with severe pain in the right flank and shoulders, posterior chest, knees and elbows since last evening. He denies any chest pain or shortness of breath or productive cough. The patient has not had any fever or chills or night sweats. He denies any sick contacts. He reports the symptoms being typical for his sickle cell crisis. He states that change of weather recently may have contributed to this. Denies numbness, tingling or focal weakness  He denies any headaches, or vision changes. He denies any abdominal pain, nausea or vomiting. No hemoptysis or epistaxis. No significant amount of NSAIDs. No blood in urine or in stool     ED Course:  BP 121/82   Pulse 88   Temp 98.3 F (36.8 C) (Oral)   Resp 17   Ht 6\' 3"  (1.905 m)   Wt 68 kg (150 lb)  SpO2 (!) 82%   BMI 18.75 kg/m    Received 3 rounds of pain meds with IV Dilaudid with some improvement  Received 1/2 NS, IV Benadryl  Total BIli 7.7 (avg 7-8 during crisis)  WBC 17.2  CXR ordered     Hospital Course:  Present on Admission: . Sickle cell disease with crisis (HCC) . Generalized anxiety disorder . GERD (gastroesophageal reflux disease) . HIV (human immunodeficiency virus infection) (HCC) . Transfusion hemosiderosis . Sickle cell crisis (HCC) . Heart murmur   This is an  opiate tolerant patient with Hb [place] who lives in [place] where he receives his care and was here visiting friends when he went into a sickle cell crisis. Initially the pain was being managed with percocet with intermittent IV Dilaudid which was ineffective. He was started on Dilaudid PCA and scheduled Ibuprofen as he has an allergy to Toradol. I was able to review records from [facility] and on his last hospitalization, the pain service recommended Oxycodone 20 mg every 3 hours and Fentanyl 50 mcg q3h prn or Dilaudid PCA, and Tylenol [phone] mg every 6 hours. He was started on Oxycodone 20 mg and Tylenol 1000 mg in addition to oxycodone, Dilaudid and Ibuprofen, On the subsequent day patient reported that his pain was decreased to a manageable level and pt requested to be discharged. At the time of discharge her reported pain as 5-6/10 and he was ambulatory without difficulty.   Pt also has a chronic hypoxia and reports that he uses 3 L/min of oxygen usually ay night. However a review of his records from his last hospitalization from [facility] shows that he required oxygen at the time of discharge although it does not say how much. It is not clear that patient why he needs Oxygen. A CXR was performed here which showed no acute abnormality. At the time of discharge he was requiring 2 L/min of Oxygen during rest and ambulation which maintained him at 94%. Pt has Oxygen at home. He is to continue on Oxygen 2 L/min ATC.   Pt also has a systolic ejection murmur at the base that is non-radiating. A review of ECHO performed on 09/18/2016 shows no evidence of PAH or significant valvular disease. He should follow up with his PMD for evaluation when in a convalescent state.   Pt also is HIV positive and is taking TRIUMEQ. His CD 4 count was 1900 and viral load undetectable. He remained afebrile throughout the hospital course and his therapy was uninterrupted.   He has an anemia of chronic disease with a  baseline Hb of 7.5-8.5 g/dL. Hb remained at baseline during hospitalization and he also had a robust reticulocytosis. Hydrea was continued without interruption. Folic acid was ordered during hospitalization but patient refused.   Pt was also treated for constipation with SMOG enema at his request with good results.   Lovenox was ordered for DVT prophylaxis and patient refused. SCD were used in place.    Disposition and Follow-up: Pt is discharged home in good condition. He is to follow up with his Physician at an appointment that is scheduled for Monday 02/08/2017  according to patient.    DISCHARGE EXAM:  General: Alert, awake, oriented x3, in no apparent distress. Well appearing HEENT: [place]/AT PEERL, EOMI, mild jaundice Neck: Trachea midline, no masses, no thyromegal,y no JVD, no carotid bruit OROPHARYNX: Moist, No exudate/ erythema/lesions.  Heart: Regular rate and rhythm, II/VI systolic ejection murmur at base. No rubs, gallops or S3. PMI non-displaced.  Exam reveals no decreased pulses. Pulmonary/Chest: Normal effort. Breath sounds normal. No. Apnea. Clear to auscultation,no stridor,  no wheezing and no rhonchi noted. No respiratory distress and no tenderness noted. Abdomen: Soft, nontender, nondistended, normal bowel sounds, no masses no hepatosplenomegaly noted. No fluid wave and no ascites. There is no guarding or rebound. Neuro: Alert and oriented to person, place and time. Normal motor skills, Displays no atrophy or tremors and exhibits normal muscle tone.  No focal neurological deficits noted cranial nerves II through XII grossly intact. No sensory deficit noted. Strength at baseline in bilateral upper and lower extremities. Gait normal. Musculoskeletal: No warmth swelling or erythema around joints, no spinal tenderness noted. Psychiatric: Patient alert and oriented x3, good insight and cognition, good recent to remote recall. Skin: Skin is warm and dry. No bruising, no ecchymosis and  no rash noted. Pt is not diaphoretic. No erythema. No pallor   Blood pressure 111/78, pulse 91, temperature 98.1 F (36.7 C), temperature source Oral, resp. rate 10, height 6\' 3"  (1.905 m), weight 68.2 kg (150 lb 5.7 oz), SpO2 94 %.   Recent Labs  02/04/17 0442  NA 145  K 4.1  CL 109  CO2 25  GLUCOSE 88  BUN 5*  CREATININE 0.76  CALCIUM 9.5   No results for input(s): AST, ALT, ALKPHOS, BILITOT, PROT, ALBUMIN in the last 72 hours. No results for input(s): LIPASE, AMYLASE in the last 72 hours.  Recent Labs  02/04/17 0442  WBC 10.3  NEUTROABS 4.1  HGB 8.3*  HCT 22.8*  MCV 109.1*  PLT 318     Total time spent including face to face and decision making was greater than 30 minutes  Signed: Naiya Corral A. 02/05/2017, 12:30 PM

## 2017-06-13 ENCOUNTER — Emergency Department (HOSPITAL_COMMUNITY)
Admission: EM | Admit: 2017-06-13 | Discharge: 2017-06-13 | Disposition: A | Discharge status: Home / Self Care | Payer: Medicaid - Out of State | Attending: Emergency Medicine | Admitting: Emergency Medicine

## 2017-06-13 ENCOUNTER — Other Ambulatory Visit: Payer: Self-pay

## 2017-06-13 ENCOUNTER — Encounter (HOSPITAL_COMMUNITY): Payer: Self-pay | Admitting: Emergency Medicine

## 2017-06-13 DIAGNOSIS — Z21 Asymptomatic human immunodeficiency virus [HIV] infection status: Secondary | ICD-10-CM | POA: Diagnosis not present

## 2017-06-13 DIAGNOSIS — Z79899 Other long term (current) drug therapy: Secondary | ICD-10-CM | POA: Diagnosis not present

## 2017-06-13 DIAGNOSIS — R52 Pain, unspecified: Secondary | ICD-10-CM | POA: Diagnosis present

## 2017-06-13 DIAGNOSIS — D5700 Hb-SS disease with crisis, unspecified: Secondary | ICD-10-CM | POA: Diagnosis not present

## 2017-06-13 LAB — COMPREHENSIVE METABOLIC PANEL
ALT: 24 U/L (ref 17–63)
AST: 33 U/L (ref 15–41)
Albumin: 3.9 g/dL (ref 3.5–5.0)
Alkaline Phosphatase: 74 U/L (ref 38–126)
Anion gap: 10 (ref 5–15)
BUN: 7 mg/dL (ref 6–20)
CO2: 24 mmol/L (ref 22–32)
Calcium: 8.6 mg/dL — ABNORMAL LOW (ref 8.9–10.3)
Chloride: 106 mmol/L (ref 101–111)
Creatinine, Ser: 0.6 mg/dL — ABNORMAL LOW (ref 0.61–1.24)
GFR calc Af Amer: >60 mL/min (ref >60)
GFR calc non Af Amer: >60 mL/min (ref >60)
Glucose, Bld: 106 mg/dL — ABNORMAL HIGH (ref 65–99)
Potassium: 3.6 mmol/L (ref 3.5–5.1)
Sodium: 140 mmol/L (ref 135–145)
Total Bilirubin: 10 mg/dL — ABNORMAL HIGH (ref 0.3–1.2)
Total Protein: 8.5 g/dL — ABNORMAL HIGH (ref 6.5–8.1)

## 2017-06-13 LAB — CBC WITH DIFFERENTIAL/PLATELET
Band Neutrophils: 0 %
Basophils Absolute: 0 10*3/uL (ref 0.0–0.1)
Basophils Relative: 0 %
Blasts: 0 %
Eosinophils Absolute: 0 10*3/uL (ref 0.0–0.7)
Eosinophils Relative: 0 %
HCT: 25.5 % — ABNORMAL LOW (ref 39.0–52.0)
Hemoglobin: 8.9 g/dL — ABNORMAL LOW (ref 13.0–17.0)
Lymphocytes Relative: 23 %
Lymphs Abs: 3.4 10*3/uL (ref 0.7–4.0)
MCH: 35.9 pg — ABNORMAL HIGH (ref 26.0–34.0)
MCHC: 34.9 g/dL (ref 30.0–36.0)
MCV: 102.8 fL — ABNORMAL HIGH (ref 78.0–100.0)
Metamyelocytes Relative: 0 %
Monocytes Absolute: 0.7 10*3/uL (ref 0.1–1.0)
Monocytes Relative: 5 %
Myelocytes: 0 %
Neutro Abs: 10.6 10*3/uL — ABNORMAL HIGH (ref 1.7–7.7)
Neutrophils Relative %: 72 %
Other: 0 %
Promyelocytes Absolute: 0 %
RBC: 2.48 MIL/uL — ABNORMAL LOW (ref 4.22–5.81)
RDW: 32.4 % — ABNORMAL HIGH (ref 11.5–15.5)
Smear Review: ADEQUATE
WBC: 14.7 10*3/uL — ABNORMAL HIGH (ref 4.0–10.5)
nRBC: 15 /100 WBC — ABNORMAL HIGH

## 2017-06-13 LAB — RETICULOCYTES
RBC.: 2.48 MIL/uL — ABNORMAL LOW (ref 4.22–5.81)
Retic Count, Absolute: 625 10*3/uL — ABNORMAL HIGH (ref 19.0–186.0)
Retic Ct Pct: 25.2 % — ABNORMAL HIGH (ref 0.4–3.1)

## 2017-06-13 MED ORDER — ONDANSETRON HCL 4 MG/2ML IJ SOLN: 4.0000 mg | INTRAMUSCULAR | Status: DC | PRN

## 2017-06-13 MED ORDER — SODIUM CHLORIDE 0.45 % IV SOLN
INTRAVENOUS | Status: DC
  Administered 2017-06-13: 05:00:00 via INTRAVENOUS

## 2017-06-13 MED ORDER — HYDROMORPHONE HCL 2 MG/ML IJ SOLN
2.0000 mg | INTRAMUSCULAR | Status: AC
  Administered 2017-06-13: 2 mg via INTRAVENOUS
  Filled 2017-06-13: qty 1

## 2017-06-13 MED ORDER — IBUPROFEN 400 MG PO TABS
400.0000 mg | ORAL_TABLET | Freq: Four times a day (QID) | ORAL | Status: DC | PRN
  Administered 2017-06-13: 400 mg via ORAL
  Filled 2017-06-13: qty 1

## 2017-06-13 MED ORDER — DIPHENHYDRAMINE HCL 25 MG PO CAPS
25.0000 mg | ORAL_CAPSULE | ORAL | Status: DC | PRN
  Administered 2017-06-13: 25 mg via ORAL
  Filled 2017-06-13: qty 1

## 2017-06-13 NOTE — Discharge Instructions (Signed)
Drink plenty of fluids.  Recheck as needed.

## 2017-06-13 NOTE — ED Provider Notes (Signed)
[facility]ERGENCY DEPARTMENT Provider Note   CSN: [id number] Arrival date & time: 06/13/17  0320     History   Chief Complaint Chief Complaint  Patient presents with  . Sickle Cell Pain Crisis    HPI Chin X Sullivan LoneGilbert is a 27 y.o. male.  HPI patient has a history of sickle cell disease, HIV, anxiety.  He reports about 8:30 PM he started having his typical crisis pain which is pain in his lower back, hips, and all of his joints.  He denies fever, joint swelling, cough, chest pain, nausea, vomiting, or diarrhea.  He states he is taking his medications including the hydroxyurea.  [place]  Past Medical History:  Diagnosis Date  . Anxiety   . HIV (human immunodeficiency virus infection) (HCC)   . Sickle cell crisis (HCC)   . Sickle cell disease [facility](HCC)     Patient Active Problem List   Diagnosis Date Noted  . Heart murmur 02/03/2017  . Sickle cell disease with crisis (HCC) 02/02/2017  . 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 Sickle cell crisis (HCC) 02/02/2017  . 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HCC) 12/20/2016  . Vitamin D deficiency 08/14/2016  . High risk medication use 09/27/2014  . Generalized anxiety disorder 05/19/2014  . GERD (gastroesophageal reflux disease) 05/19/2014  . HIV (human immunodeficiency virus infection) (HCC) 05/23/2012    History reviewed. No pertinent surgical history.     Home Medications    Prior to Admission medications   Medication Sig Start Date End Date Taking? Authorizing Provider  ALPRAZolam Prudy Feeler(XANAX) 1 MG tablet Take 1 mg by mouth 2 (two) times daily. 12/03/16  Yes [provider]  guaiFENesin-codeine 100-10 MG/5ML syrup Take 10 mLs by mouth 4 (four) times daily as needed for cough.  01/01/17  Yes [provider]  hydroxyurea (HYDREA) 500 MG capsule Take 2,000 mg by mouth at bedtime. 12/28/15  Yes [provider]  ibuprofen (ADVIL,MOTRIN) 200 MG tablet Take 800 mg by mouth every 6 (six) hours as needed  for moderate pain.   Yes [provider]  oxyCODONE-acetaminophen (PERCOCET) 10-325 MG tablet Take 1 tablet by mouth every 6 (six) hours as needed for pain.  01/05/17  Yes [provider]  TRIUMEQ 600-50-300 MG tablet Take 1 tablet by mouth at bedtime.  12/22/16  Yes [provider]    Family History History reviewed. No pertinent family history.  Social History Social History   Tobacco Use  . Smoking status: Never Smoker  . Smokeless tobacco: Never Used  Substance Use Topics  . Alcohol use: No  . Drug use: No     Allergies   Ketorolac   Review of Systems Review of Systems  All other systems reviewed and are negative.    Physical Exam Updated Vital Signs BP 132/82 (BP Location: Left Arm)   Pulse 88   Temp 98.1 F (36.7 C) (Oral)   Resp 14   Ht 6\' 3"  (1.905 m)   Wt 68 kg (150 lb)   SpO2 94%   BMI 18.75 kg/m   Vital signs normal    Physical Exam  Constitutional: He is oriented to person, place, and time.  Non-toxic appearance. He does not appear ill.  Thin male who appears uncomfortable  HENT:  Head: Normocephalic and atraumatic.  Right Ear: External ear normal.  Left Ear: External ear normal.  Nose: Nose normal. No mucosal edema or rhinorrhea.  Mouth/Throat: Oropharynx is clear and moist and mucous membranes are normal. No dental  abscesses or uvula swelling.  Eyes: Conjunctivae and EOM are normal. Pupils are equal, round, and reactive to light. Scleral icterus is present.  Neck: Normal range of motion and full passive range of motion without pain. Neck supple.  Cardiovascular: Normal rate, regular rhythm and normal heart sounds. Exam reveals no gallop and no friction rub.  No murmur heard. Pulmonary/Chest: Effort normal and breath sounds normal. No respiratory distress. He has no wheezes. He has no rhonchi. He has no rales. He exhibits no tenderness and no crepitus.  Abdominal: Soft. Normal appearance and bowel sounds are normal. He  exhibits no distension. There is no tenderness. There is no rebound and no guarding.  Musculoskeletal: Normal range of motion. He exhibits no edema or tenderness.  Patient's joints are uncomfortable to palpation however there is no effusions or joint swelling seen  Neurological: He is alert and oriented to person, place, and time. He has normal strength. No cranial nerve deficit.  Skin: Skin is warm, dry and intact. No rash noted. No erythema. No pallor.  Psychiatric: He has a normal mood and affect. His speech is normal and behavior is normal. His mood appears not anxious.  Nursing note and vitals reviewed.    ED Treatments / Results  Labs (all labs ordered are listed, but only abnormal results are displayed) Results for orders placed or performed during the hospital encounter of 06/13/17  CBC WITH DIFFERENTIAL  Result Value Ref Range   WBC 14.7 (H) 4.0 - 10.5 K/uL   RBC 2.48 (L) 4.22 - 5.81 MIL/uL   Hemoglobin 8.9 (L) 13.0 - 17.0 g/dL   HCT 16.1 (L) 09.6 - 04.5 %   MCV 102.8 (H) 78.0 - 100.0 fL   MCH 35.9 (H) 26.0 - 34.0 pg   MCHC 34.9 30.0 - 36.0 g/dL   RDW 40.9 (H) 81.1 - 91.4 %   Neutrophils Relative % 72 %   Lymphocytes Relative 23 %   Monocytes Relative 5 %   Eosinophils Relative 0 %   Basophils Relative 0 %   Band Neutrophils 0 %   Metamyelocytes Relative 0 %   Myelocytes 0 %   Promyelocytes Absolute 0 %   Blasts 0 %   nRBC 15 (H) 0 /100 WBC   Other 0 %   Neutro Abs 10.6 (H) 1.7 - 7.7 K/uL   Lymphs Abs 3.4 0.7 - 4.0 K/uL   Monocytes Absolute 0.7 0.1 - 1.0 K/uL   Eosinophils Absolute 0.0 0.0 - 0.7 K/uL   Basophils Absolute 0.0 0.0 - 0.1 K/uL   RBC Morphology ROULEAUX    Smear Review      PLATELET CLUMPS NOTED ON SMEAR, COUNT APPEARS ADEQUATE  Reticulocytes  Result Value Ref Range   Retic Ct Pct 25.2 (H) 0.4 - 3.1 %   RBC. 2.48 (L) 4.22 - 5.81 MIL/uL   Retic Count, Absolute 625.0 (H) 19.0 - 186.0 K/uL  Comprehensive metabolic panel  Result Value Ref Range    Sodium 140 135 - 145 mmol/L   Potassium 3.6 3.5 - 5.1 mmol/L   Chloride 106 101 - 111 mmol/L   CO2 24 22 - 32 mmol/L   Glucose, Bld 106 (H) 65 - 99 mg/dL   BUN 7 6 - 20 mg/dL   Creatinine, Ser 7.82 (L) 0.61 - 1.24 mg/dL   Calcium 8.6 (L) 8.9 - 10.3 mg/dL   Total Protein 8.5 (H) 6.5 - 8.1 g/dL   Albumin 3.9 3.5 - 5.0 g/dL   AST 33 15 -  41 U/L   ALT 24 17 - 63 U/L   Alkaline Phosphatase 74 38 - 126 U/L   Total Bilirubin 10.0 (H) 0.3 - 1.2 mg/dL   GFR calc non Af Amer >60 >60 mL/min   GFR calc Af Amer >60 >60 mL/min   Anion gap 10 5 - 15   Laboratory interpretation all normal except stable anemia, appropriate elevated reticulocyte count, and expected elevation of total bilirubin consistent with his scleral icterus    EKG  EKG Interpretation None       Radiology No results found.  Procedures Procedures (including critical care time)  Medications Ordered in ED Medications  ondansetron (ZOFRAN) injection 4 mg (not administered)  diphenhydrAMINE (BENADRYL) capsule 25-50 mg (25 mg Oral Given 06/13/17 0439)  ibuprofen (ADVIL,MOTRIN) tablet 400 mg (not administered)  0.45 % sodium chloride infusion ( Intravenous New Bag/Given 06/13/17 0507)  HYDROmorphone (DILAUDID) injection 2 mg (2 mg Intravenous Given 06/13/17 0433)  HYDROmorphone (DILAUDID) injection 2 mg (2 mg Intravenous Given 06/13/17 0507)  HYDROmorphone (DILAUDID) injection 2 mg (2 mg Intravenous Given 06/13/17 0558)     Initial Impression / Assessment and Plan / ED Course  I have reviewed the triage vital signs and the nursing notes.  Pertinent labs & imaging results that were available during my care of the patient were reviewed by me and considered in my medical decision making (see chart for details).     When I review his last admission in October he was placed on oral Motrin because he is allergic to Toradol.  Patient was started on the sickle cell protocol with oral Motrin.  Patient has no chest pain, fever or,  or coughing.  Recheck at 6:30 AM patient was sleeping, when he was awake and he states his pain was much improved.  He states he feels like he can be discharged home he does not feel like he needs to be admitted.  Final Clinical Impressions(s) / ED Diagnoses   Final diagnoses:  Sickle cell pain crisis [facility])    ED Discharge Orders    None      Plan discharge  Devoria Albe, MD, Concha Pyo, MD 06/13/17 [phone]

## 2017-06-13 NOTE — ED Triage Notes (Signed)
Patient start having sickle cell crisis this AM.

## 2017-06-13 NOTE — ED Notes (Signed)
Attempted to start IV x 2. Was unsuccessful. Co-worker to attempt.

## 2017-11-14 ENCOUNTER — Emergency Department (HOSPITAL_COMMUNITY): Payer: Medicaid - Out of State

## 2017-11-14 ENCOUNTER — Encounter (HOSPITAL_COMMUNITY): Payer: Self-pay | Admitting: Emergency Medicine

## 2017-11-14 ENCOUNTER — Emergency Department (HOSPITAL_COMMUNITY)
Admission: EM | Admit: 2017-11-14 | Discharge: 2017-11-14 | Disposition: A | Discharge status: Home / Self Care | Payer: Medicaid - Out of State | Attending: Emergency Medicine | Admitting: Emergency Medicine

## 2017-11-14 ENCOUNTER — Other Ambulatory Visit: Payer: Self-pay

## 2017-11-14 DIAGNOSIS — D5700 Hb-SS disease with crisis, unspecified: Secondary | ICD-10-CM | POA: Diagnosis not present

## 2017-11-14 DIAGNOSIS — Z21 Asymptomatic human immunodeficiency virus [HIV] infection status: Secondary | ICD-10-CM | POA: Diagnosis not present

## 2017-11-14 DIAGNOSIS — Z79899 Other long term (current) drug therapy: Secondary | ICD-10-CM | POA: Insufficient documentation

## 2017-11-14 DIAGNOSIS — M545 Low back pain: Secondary | ICD-10-CM | POA: Diagnosis present

## 2017-11-14 DIAGNOSIS — D638 Anemia in other chronic diseases classified elsewhere: Secondary | ICD-10-CM | POA: Insufficient documentation

## 2017-11-14 LAB — CBC WITH DIFFERENTIAL/PLATELET
Basophils Absolute: 0 10*3/uL (ref 0.0–0.1)
Basophils Relative: 0 %
Eosinophils Absolute: 0 10*3/uL (ref 0.0–0.7)
Eosinophils Relative: 0 %
HCT: 20.4 % — ABNORMAL LOW (ref 39.0–52.0)
Hemoglobin: 7.4 g/dL — ABNORMAL LOW (ref 13.0–17.0)
Lymphocytes Relative: 51 %
Lymphs Abs: 6.8 10*3/uL — ABNORMAL HIGH (ref 0.7–4.0)
MCH: 37.6 pg — ABNORMAL HIGH (ref 26.0–34.0)
MCHC: 36.3 g/dL — ABNORMAL HIGH (ref 30.0–36.0)
MCV: 103.6 fL — ABNORMAL HIGH (ref 78.0–100.0)
Monocytes Absolute: 1.3 10*3/uL — ABNORMAL HIGH (ref 0.1–1.0)
Monocytes Relative: 10 %
Neutro Abs: 5.2 10*3/uL (ref 1.7–7.7)
Neutrophils Relative %: 39 %
Platelets: 234 10*3/uL (ref 150–400)
RBC: 1.97 MIL/uL — ABNORMAL LOW (ref 4.22–5.81)
RDW: 23 % — ABNORMAL HIGH (ref 11.5–15.5)
WBC: 13.3 10*3/uL — ABNORMAL HIGH (ref 4.0–10.5)

## 2017-11-14 LAB — COMPREHENSIVE METABOLIC PANEL
ALT: 26 U/L (ref 0–44)
AST: 60 U/L — ABNORMAL HIGH (ref 15–41)
Albumin: 3.9 g/dL (ref 3.5–5.0)
Alkaline Phosphatase: 51 U/L (ref 38–126)
Anion gap: 8 (ref 5–15)
BUN: 7 mg/dL (ref 6–20)
CO2: 23 mmol/L (ref 22–32)
Calcium: 9 mg/dL (ref 8.9–10.3)
Chloride: 109 mmol/L (ref 98–111)
Creatinine, Ser: 1.06 mg/dL (ref 0.61–1.24)
GFR calc Af Amer: >60 mL/min (ref >60)
GFR calc non Af Amer: >60 mL/min (ref >60)
Glucose, Bld: 90 mg/dL (ref 70–99)
Potassium: 4 mmol/L (ref 3.5–5.1)
Sodium: 140 mmol/L (ref 135–145)
Total Bilirubin: 7.3 mg/dL — ABNORMAL HIGH (ref 0.3–1.2)
Total Protein: 8.7 g/dL — ABNORMAL HIGH (ref 6.5–8.1)

## 2017-11-14 LAB — RETICULOCYTES
RBC.: 1.97 MIL/uL — ABNORMAL LOW (ref 4.22–5.81)
Retic Count, Absolute: 350.7 10*3/uL — ABNORMAL HIGH (ref 19.0–186.0)
Retic Ct Pct: 17.8 % — ABNORMAL HIGH (ref 0.4–3.1)

## 2017-11-14 MED ORDER — SODIUM CHLORIDE 0.9 % IV BOLUS
500.0000 mL | Freq: Once | INTRAVENOUS | Status: AC
  Administered 2017-11-14: 500 mL via INTRAVENOUS

## 2017-11-14 MED ORDER — DIPHENHYDRAMINE HCL 25 MG PO CAPS
50.0000 mg | ORAL_CAPSULE | Freq: Once | ORAL | Status: AC
  Administered 2017-11-14: 50 mg via ORAL
  Filled 2017-11-14: qty 2

## 2017-11-14 MED ORDER — ACETAMINOPHEN 500 MG PO TABS
1000.0000 mg | ORAL_TABLET | Freq: Once | ORAL | Status: AC
Start: 2017-11-14 — End: 2017-11-14
  Administered 2017-11-14: 1000 mg via ORAL
  Filled 2017-11-14: qty 2

## 2017-11-14 MED ORDER — MORPHINE SULFATE (PF) 4 MG/ML IV SOLN
8.0000 mg | Freq: Once | INTRAVENOUS | Status: AC
  Administered 2017-11-14: 8 mg via INTRAVENOUS
  Filled 2017-11-14: qty 2

## 2017-11-14 MED ORDER — MORPHINE SULFATE (PF) 4 MG/ML IV SOLN
8.0000 mg | Freq: Once | INTRAVENOUS | Status: AC
Start: 2017-11-14 — End: 2017-11-14
  Administered 2017-11-14: 8 mg via INTRAVENOUS
  Filled 2017-11-14: qty 2

## 2017-11-14 NOTE — Discharge Instructions (Addendum)
Follow up with sickle doctor in [place] for reassessment this week. Follow up repeat hemoglobin later this week, return for signs of bleeding.  If you were given medicines take as directed.  If you are on coumadin or contraceptives realize their levels and effectiveness is altered by many different medicines.  If you have any reaction (rash, tongues swelling, other) to the medicines stop taking and see a physician.    If your blood pressure was elevated in the ER make sure you follow up for management with a primary doctor or return for chest pain, shortness of breath or stroke symptoms.  Please follow up as directed and return to the ER or see a physician for new or worsening symptoms.  Thank you. Vitals:   11/14/17 0640  BP: 106/71  Pulse: 87  Resp: 20  Temp: 98.8 F (37.1 C)  TempSrc: Oral  SpO2: 93%

## 2017-11-14 NOTE — ED Notes (Signed)
Patient verbalizes understanding of discharge instructions. Opportunity for questioning and answers were provided. Armband removed by staff, pt discharged from ED ambulatory.   

## 2017-11-14 NOTE — ED Triage Notes (Signed)
Pt began having lower back and hip pain earlier tonight. Feels like this is a classic exacerbation of his sickle cell. No chest pain.

## 2017-11-14 NOTE — ED Provider Notes (Signed)
` [facility] EMERGENCY DEPARTMENT Provider Note   CSN: [id number] Arrival date & time: 11/14/17  0631     History   Chief Complaint Chief Complaint  Patient presents with  . Sickle Cell Pain Crisis    HPI Martin [name] is a 27 y.o. male.  Patient presents with lower back and hip pain since last evening.  This is similar to previous sickle cell pain crises.  Patient is from a normally follows up in [place].  Patient normally takes Oxyc patient denies chest pain or shortness of breath, patient has mild cough without fever.  Odon for pain.  Patient denies current antibiotic use.  Pain is deep achy sensation.  Patient denies cigarette smoking.     Past Medical History:  Diagnosis Date  . Anxiety   . HIV (human immunodeficiency virus infection) (HCC)   . Sickle cell crisis (HCC)   . Sickle cell disease [facility])     Patient Active Problem List   Diagnosis Date Noted  . Heart murmur 02/03/2017  . Sickle cell disease with crisis (HCC) 02/02/2017  . 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 Sickle cell crisis (HCC) 02/02/2017  . 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HCC) 12/20/2016  . Vitamin D deficiency 08/14/2016  . High risk medication use 09/27/2014  . Generalized anxiety disorder 05/19/2014  . GERD (gastroesophageal reflux disease) 05/19/2014  . HIV (human immunodeficiency virus infection) (HCC) 05/23/2012    History reviewed. No pertinent surgical history.      Home Medications    Prior to Admission medications   Medication Sig Start Date End Date Taking? Authorizing Provider  ALPRAZolam Prudy Feeler) 1 MG tablet Take 1 mg by mouth 2 (two) times daily. 12/03/16  Yes [provider]  guaiFENesin-codeine 100-10 MG/5ML syrup Take 10 mLs by mouth 4 (four) times daily as needed for cough.  01/01/17  Yes [provider]  hydroxyurea (HYDREA) 500 MG capsule Take 2,000 mg by mouth at bedtime. 12/28/15  Yes [provider]  ibuprofen (ADVIL,MOTRIN)  200 MG tablet Take 800 mg by mouth every 6 (six) hours as needed for moderate pain.   Yes [provider]  oxyCODONE-acetaminophen (PERCOCET) 10-325 MG tablet Take 1 tablet by mouth every 6 (six) hours as needed for pain.  01/05/17  Yes [provider]  TRIUMEQ 600-50-300 MG tablet Take 1 tablet by mouth at bedtime.  12/22/16  Yes [provider]    Family History No family history on file.  Social History Social History   Tobacco Use  . Smoking status: Never Smoker  . Smokeless tobacco: Never Used  Substance Use Topics  . Alcohol use: No  . Drug use: No     Allergies   Ketorolac   Review of Systems Review of Systems  Constitutional: Negative for chills and fever.  HENT: Negative for congestion.   Eyes: Negative for visual disturbance.  Respiratory: Negative for shortness of breath.   Cardiovascular: Negative for chest pain.  Gastrointestinal: Negative for abdominal pain and vomiting.  Genitourinary: Negative for dysuria and flank pain.  Musculoskeletal: Positive for arthralgias and back pain. Negative for neck pain and neck stiffness.  Skin: Negative for rash.  Neurological: Negative for light-headedness and headaches.  To patient presents with clinically sickle cell pain crises  Physical Exam Updated Vital Signs BP 110/61   Pulse 70   Temp 98.8 F (37.1 C) (Oral)   Resp 15   SpO2 100%   Physical Exam  Constitutional: He is oriented to person, place,  and time. He appears well-developed and well-nourished.  HENT:  Head: Normocephalic and atraumatic.  Eyes: Conjunctivae are normal. Right eye exhibits no discharge. Left eye exhibits no discharge.  Neck: Normal range of motion. Neck supple. No tracheal deviation present.  Cardiovascular: Normal rate and regular rhythm.  Pulmonary/Chest: Effort normal and breath sounds normal.  Abdominal: Soft. He exhibits no distension. There is no tenderness. There is no guarding.  Musculoskeletal: He  exhibits no edema.  Patient uncomfortable in the room however no reproducible bony tenderness.  Neurological: He is alert and oriented to person, place, and time.  Skin: Skin is warm. No rash noted.  Psychiatric: He has a normal mood and affect.  Nursing note and vitals reviewed.    ED Treatments / Results  Labs (all labs ordered are listed, but only abnormal results are displayed) Labs Reviewed  COMPREHENSIVE METABOLIC PANEL - Abnormal; Notable for the following components:      Result Value   Total Protein 8.7 (*)    AST 60 (*)    Total Bilirubin 7.3 (*)    All other components within normal limits  CBC WITH DIFFERENTIAL/PLATELET - Abnormal; Notable for the following components:   WBC 13.3 (*)    RBC 1.97 (*)    Hemoglobin 7.4 (*)    HCT 20.4 (*)    MCV 103.6 (*)    MCH 37.6 (*)    MCHC 36.3 (*)    RDW 23.0 (*)    Lymphs Abs 6.8 (*)    Monocytes Absolute 1.3 (*)    All other components within normal limits  RETICULOCYTES - Abnormal; Notable for the following components:   Retic Ct Pct 17.8 (*)    RBC. 1.97 (*)    Retic Count, Absolute 350.7 (*)    All other components within normal limits  CBC WITH DIFFERENTIAL/PLATELET    EKG None  Radiology Dg Chest 2 View  Result Date: 11/14/2017 CLINICAL DATA:  Chest pain EXAM: CHEST - 2 VIEW COMPARISON:  02/05/2017 FINDINGS: Cardiac shadow is stable. The lungs are hyperinflated consistent with COPD. Stable interstitial changes are again identified bilaterally. No bony abnormality is seen. IMPRESSION: Chronic changes without acute abnormality. Electronically Signed   By: Alcide CleverMark  Lukens M.D.   On: 11/14/2017 07:41    Procedures Procedures (including critical care time)  Medications Ordered in ED Medications  sodium chloride 0.9 % bolus 500 mL (0 mLs Intravenous Stopped 11/14/17 0952)  morphine 4 MG/ML injection 8 mg (8 mg Intravenous Given 11/14/17 0853)  diphenhydrAMINE (BENADRYL) capsule 50 mg (50 mg Oral Given 11/14/17 0854)    acetaminophen (TYLENOL) tablet 1,000 mg (1,000 mg Oral Given 11/14/17 0853)  morphine 4 MG/ML injection 8 mg (8 mg Intravenous Given 11/14/17 1025)     Initial Impression / Assessment and Plan / ED Course  I have reviewed the triage vital signs and the nursing notes.  Pertinent labs & imaging results that were available during my care of the patient were reviewed by me and considered in my medical decision making (see chart for details).     Patient presents with clinically sickle cell pain crisis similar to previous.  Patient's well-appearing otherwise on exam without fever or infectious symptoms.   Plan for screening blood work, pain control, chest x-ray due to cough.  Patient did not discuss however does have HIV listed on his past medical history. Patient's pain improved however still moderate repeat pain meds given.  On second reassessment patient well-appearing, pain controlled comfortable with discharge.  Challenges that blood work clotted, discussed with lab and nursing has been asked to re-collect and send.  Delay discussed with nursing. Blood work eventually obtained mild worsening hemoglobin, patient is not symptomatic.  Patient stable to follow closely with hematology later this week. Results and differential diagnosis were discussed with the patient/parent/guardian. Xrays were independently reviewed by myself.  Close follow up outpatient was discussed, comfortable with the plan.   Medications  sodium chloride 0.9 % bolus 500 mL (0 mLs Intravenous Stopped 11/14/17 0952)  morphine 4 MG/ML injection 8 mg (8 mg Intravenous Given 11/14/17 0853)  diphenhydrAMINE (BENADRYL) capsule 50 mg (50 mg Oral Given 11/14/17 0854)  acetaminophen (TYLENOL) tablet 1,000 mg (1,000 mg Oral Given 11/14/17 0853)  morphine 4 MG/ML injection 8 mg (8 mg Intravenous Given 11/14/17 1025)    Vitals:   11/14/17 1015 11/14/17 1045 11/14/17 1100 11/14/17 1115  BP: (!) 104/59 113/63 112/67 110/61  Pulse: 76 67  60 70  Resp: 19 15 14 15   Temp:      TempSrc:      SpO2: 92% 99% 100% 100%    Final diagnoses:  Sickle cell pain crisis (HCC)  Anemia, sickle cell with crisis (HCC)     Final Clinical Impressions(s) / ED Diagnoses   Final diagnoses:  Sickle cell pain crisis (HCC)  Anemia, sickle cell with crisis [facility])    ED Discharge Orders    None       Blane Ohara, MD 11/14/17 1215

## 2018-01-17 ENCOUNTER — Other Ambulatory Visit: Payer: Self-pay

## 2018-01-17 ENCOUNTER — Encounter (HOSPITAL_COMMUNITY): Payer: Self-pay | Admitting: *Deleted

## 2018-01-17 ENCOUNTER — Emergency Department (HOSPITAL_COMMUNITY)
Admission: EM | Admit: 2018-01-17 | Discharge: 2018-01-17 | Disposition: A | Discharge status: Home / Self Care | Payer: Medicaid - Out of State | Attending: Emergency Medicine | Admitting: Emergency Medicine

## 2018-01-17 DIAGNOSIS — D5700 Hb-SS disease with crisis, unspecified: Secondary | ICD-10-CM

## 2018-01-17 DIAGNOSIS — Z79899 Other long term (current) drug therapy: Secondary | ICD-10-CM | POA: Insufficient documentation

## 2018-01-17 DIAGNOSIS — D57219 Sickle-cell/Hb-C disease with crisis, unspecified: Secondary | ICD-10-CM | POA: Diagnosis not present

## 2018-01-17 LAB — CBC WITH DIFFERENTIAL/PLATELET
Basophils Absolute: 0 10*3/uL (ref 0.0–0.1)
Basophils Relative: 0 %
Eosinophils Absolute: 0.1 10*3/uL (ref 0.0–0.7)
Eosinophils Relative: 1 %
HCT: 24 % — ABNORMAL LOW (ref 39.0–52.0)
Hemoglobin: 8.7 g/dL — ABNORMAL LOW (ref 13.0–17.0)
Lymphocytes Relative: 37 %
Lymphs Abs: 4.7 10*3/uL — ABNORMAL HIGH (ref 0.7–4.0)
MCH: 46.5 pg — ABNORMAL HIGH (ref 26.0–34.0)
MCHC: 36.3 g/dL — ABNORMAL HIGH (ref 30.0–36.0)
MCV: 128.3 fL — ABNORMAL HIGH (ref 78.0–100.0)
Monocytes Absolute: 1.4 10*3/uL — ABNORMAL HIGH (ref 0.1–1.0)
Monocytes Relative: 11 %
Neutro Abs: 6.4 10*3/uL (ref 1.7–7.7)
Neutrophils Relative %: 51 %
Platelets: 265 10*3/uL (ref 150–400)
RBC: 1.87 MIL/uL — ABNORMAL LOW (ref 4.22–5.81)
RDW: 18.5 % — ABNORMAL HIGH (ref 11.5–15.5)
Smear Review: ADEQUATE
WBC: 12.6 10*3/uL — ABNORMAL HIGH (ref 4.0–10.5)

## 2018-01-17 LAB — COMPREHENSIVE METABOLIC PANEL
ALT: 35 U/L (ref 0–44)
AST: 49 U/L — ABNORMAL HIGH (ref 15–41)
Albumin: 4.2 g/dL (ref 3.5–5.0)
Alkaline Phosphatase: 57 U/L (ref 38–126)
Anion gap: 10 (ref 5–15)
BUN: 6 mg/dL (ref 6–20)
CO2: 23 mmol/L (ref 22–32)
Calcium: 9.1 mg/dL (ref 8.9–10.3)
Chloride: 108 mmol/L (ref 98–111)
Creatinine, Ser: 1.13 mg/dL (ref 0.61–1.24)
GFR calc Af Amer: >60 mL/min (ref >60)
GFR calc non Af Amer: >60 mL/min (ref >60)
Glucose, Bld: 96 mg/dL (ref 70–99)
Potassium: 3.6 mmol/L (ref 3.5–5.1)
Sodium: 141 mmol/L (ref 135–145)
Total Bilirubin: 8.9 mg/dL — ABNORMAL HIGH (ref 0.3–1.2)
Total Protein: 8.7 g/dL — ABNORMAL HIGH (ref 6.5–8.1)

## 2018-01-17 LAB — RETICULOCYTES
RBC.: 1.87 MIL/uL — ABNORMAL LOW (ref 4.22–5.81)
Retic Count, Absolute: 282.4 10*3/uL — ABNORMAL HIGH (ref 19.0–186.0)
Retic Ct Pct: 15.1 % — ABNORMAL HIGH (ref 0.4–3.1)

## 2018-01-17 LAB — PATHOLOGIST SMEAR REVIEW

## 2018-01-17 MED ORDER — HYDROMORPHONE HCL 1 MG/ML IJ SOLN: 2.0000 mg | INTRAMUSCULAR | Status: AC

## 2018-01-17 MED ORDER — DIPHENHYDRAMINE HCL 50 MG/ML IJ SOLN
25.0000 mg | Freq: Once | INTRAMUSCULAR | Status: AC
  Administered 2018-01-17: 25 mg via INTRAVENOUS
  Filled 2018-01-17: qty 1

## 2018-01-17 MED ORDER — HYDROMORPHONE HCL 1 MG/ML IJ SOLN
2.0000 mg | INTRAMUSCULAR | Status: AC
  Administered 2018-01-17: 2 mg via INTRAVENOUS
  Filled 2018-01-17: qty 2

## 2018-01-17 MED ORDER — DEXTROSE-NACL 5-0.45 % IV SOLN
INTRAVENOUS | Status: DC
  Administered 2018-01-17: 06:00:00 via INTRAVENOUS

## 2018-01-17 MED ORDER — ONDANSETRON HCL 4 MG/2ML IJ SOLN
4.0000 mg | INTRAMUSCULAR | Status: DC | PRN
  Administered 2018-01-17: 4 mg via INTRAVENOUS
  Filled 2018-01-17: qty 2

## 2018-01-17 NOTE — ED Triage Notes (Signed)
PT reported he took an [place] to ED . Pt has called his Aunt  Who is at work . His Aunt was to call his Uncle to pick him up.

## 2018-01-17 NOTE — ED Provider Notes (Signed)
TIME SEEN: 4:44 AM  CHIEF COMPLAINT: Sickle cell crisis  HPI: Patient is a 27 year old male with history of HIV, sickle cell disease who presents emergency department with sickle cell crisis that started yesterday.  Complaining of diffuse body aches which is typical of his sickle cell crisis.  Most of his pain is in his lower back.  States his last transfusion was in July.  Reports hemoglobin runs between 7 and 8.  Denies fevers, cough, chest pain, shortness of breath, numbness, tingling, focal weakness, bowel or bladder incontinence.  States he is here visiting family.  He lives in [place].  ROS: See HPI Constitutional: no fever  Eyes: no drainage  ENT: no runny nose   Cardiovascular:  no chest pain  Resp: no SOB  GI: no vomiting GU: no dysuria Integumentary: no rash  Allergy: no hives  Musculoskeletal: no leg swelling  Neurological: no slurred speech ROS otherwise negative  PAST MEDICAL HISTORY/PAST SURGICAL HISTORY:  Past Medical History:  Diagnosis Date  . Anxiety   . HIV (human immunodeficiency virus infection) (HCC)   . Sickle cell crisis (HCC)   . 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HCC)     MEDICATIONS:  Prior to Admission medications   Medication Sig Start Date End Date Taking? Authorizing Provider  ALPRAZolam Prudy Feeler) 1 MG tablet Take 1 mg by mouth 2 (two) times daily. 12/03/16   [provider]  guaiFENesin-codeine 100-10 MG/5ML syrup Take 10 mLs by mouth 4 (four) times daily as needed for cough.  01/01/17   [provider]  hydroxyurea (HYDREA) 500 MG capsule Take 2,000 mg by mouth at bedtime. 12/28/15   [provider]  ibuprofen (ADVIL,MOTRIN) 200 MG tablet Take 800 mg by mouth every 6 (six) hours as needed for moderate pain.    [provider]  oxyCODONE-acetaminophen (PERCOCET) 10-325 MG tablet Take 1 tablet by mouth every 6 (six) hours as needed for pain.  01/05/17   [provider]  TRIUMEQ 600-50-300 MG tablet Take 1 tablet by mouth at  bedtime.  12/22/16   [provider]    ALLERGIES:  Allergies  Allergen Reactions  . Ketorolac Swelling    Patient reports facial edema and left arm edema after administration.    SOCIAL HISTORY:  Social History   Tobacco Use  . Smoking status: Never Smoker  . Smokeless tobacco: Never Used  Substance Use Topics  . Alcohol use: No    FAMILY HISTORY: No family history on file.  EXAM: BP 110/65 (BP Location: Right Arm)   Pulse (!) 105   Temp 98.6 F (37 C) (Oral)   Resp 18   Ht 6\' 3"  (1.905 m)   Wt 68 kg   SpO2 92%   BMI 18.75 kg/m  CONSTITUTIONAL: Alert and oriented and responds appropriately to questions.  Appears uncomfortable.  Afebrile.  Nontoxic. HEAD: Normocephalic EYES: Conjunctivae clear, pupils appear equal, EOMI ENT: normal nose; moist mucous membranes NECK: Supple, no meningismus, no nuchal rigidity, no LAD  CARD: Regular and minimally tachycardic; S1 and S2 appreciated; no murmurs, no clicks, no rubs, no gallops RESP: Normal chest excursion without splinting or tachypnea; breath sounds clear and equal bilaterally; no wheezes, no rhonchi, no rales, no hypoxia or respiratory distress, speaking full sentences ABD/GI: Normal bowel sounds; non-distended; soft, non-tender, no rebound, no guarding, no peritoneal signs, no hepatosplenomegaly BACK:  The back appears normal and is non-tender to palpation, there is no CVA tenderness EXT: Normal ROM in all joints; non-tender to palpation; no edema;  normal capillary refill; no cyanosis, no calf tenderness or swelling    SKIN: Normal color for age and race; warm; no rash NEURO: Moves all extremities equally, normal sensation diffusely PSYCH: The patient's mood and manner are appropriate. Grooming and personal hygiene are appropriate.  MEDICAL DECISION MAKING: Patient here with sickle cell pain crisis.  Hemodynamically stable.  Will obtain labs.  Will give IV fluids, pain medication and reassess.  ED PROGRESS: Labs  appear to be near patient's baseline.  Will reassess after 3 rounds of pain medication.    Patient's pain is well controlled after 3 rounds of pain medication.  He feels safe with plan for discharge home.  He has several documented oxygen saturations in the 80s.  It appears that his pulse oximetry was not picking up well on his finger.  Once it was moved to his forehead he was satting 100% on room air.  No respiratory distress.  No shortness of breath.  Lungs are clear.  No fever or cough.   At this time, I do not feel there is any life-threatening condition present. I have reviewed and discussed all results (EKG, imaging, lab, urine as appropriate) and exam findings with patient/family. I have reviewed nursing notes and appropriate previous records.  I feel the patient is safe to be discharged home without further emergent workup and can continue workup as an outpatient as needed. Discussed usual and customary return precautions. Patient/family verbalize understanding and are comfortable with this plan.  Outpatient follow-up has been provided if needed. All questions have been answered.    Timithy Arons, Layla Maw, DO 01/17/18 [phone]

## 2018-01-17 NOTE — ED Notes (Signed)
Pt complains of a sickle cell crisis. Pt reports he is from [place] and only here visiting family.

## 2018-01-17 NOTE — ED Notes (Signed)
Pt ambulated to bathroom. Pt reports no dizziness. Pt tolerated well.

## 2018-01-17 NOTE — ED Triage Notes (Signed)
C/o generalized body ache  Onset yest , states he is in a sickle cell crisis

## 2018-02-21 DIAGNOSIS — J202 Acute bronchitis due to streptococcus: Secondary | ICD-10-CM | POA: Insufficient documentation

## 2018-03-07 DIAGNOSIS — M898X9 Other specified disorders of bone, unspecified site: Secondary | ICD-10-CM | POA: Insufficient documentation

## 2018-03-07 DIAGNOSIS — M25559 Pain in unspecified hip: Secondary | ICD-10-CM | POA: Insufficient documentation

## 2018-03-09 DIAGNOSIS — D5700 Hb-SS disease with crisis, unspecified: Secondary | ICD-10-CM | POA: Insufficient documentation

## 2018-03-21 ENCOUNTER — Emergency Department (HOSPITAL_COMMUNITY)
Admission: EM | Admit: 2018-03-21 | Discharge: 2018-03-21 | Disposition: A | Discharge status: Home / Self Care | Payer: Medicaid - Out of State | Attending: Emergency Medicine | Admitting: Emergency Medicine

## 2018-03-21 ENCOUNTER — Other Ambulatory Visit: Payer: Self-pay

## 2018-03-21 ENCOUNTER — Encounter (HOSPITAL_COMMUNITY): Payer: Self-pay

## 2018-03-21 DIAGNOSIS — D5700 Hb-SS disease with crisis, unspecified: Secondary | ICD-10-CM

## 2018-03-21 DIAGNOSIS — Z79899 Other long term (current) drug therapy: Secondary | ICD-10-CM | POA: Insufficient documentation

## 2018-03-21 DIAGNOSIS — B20 Human immunodeficiency virus [HIV] disease: Secondary | ICD-10-CM | POA: Diagnosis not present

## 2018-03-21 LAB — COMPREHENSIVE METABOLIC PANEL
ALT: 25 U/L (ref 0–44)
AST: 30 U/L (ref 15–41)
Albumin: 4.6 g/dL (ref 3.5–5.0)
Alkaline Phosphatase: 52 U/L (ref 38–126)
Anion gap: 10 (ref 5–15)
BUN: 8 mg/dL (ref 6–20)
CO2: 21 mmol/L — ABNORMAL LOW (ref 22–32)
Calcium: 9 mg/dL (ref 8.9–10.3)
Chloride: 109 mmol/L (ref 98–111)
Creatinine, Ser: 1.08 mg/dL (ref 0.61–1.24)
GFR calc Af Amer: >60 mL/min (ref >60)
GFR calc non Af Amer: >60 mL/min (ref >60)
Glucose, Bld: 94 mg/dL (ref 70–99)
Potassium: 3.5 mmol/L (ref 3.5–5.1)
Sodium: 140 mmol/L (ref 135–145)
Total Bilirubin: 8.8 mg/dL — ABNORMAL HIGH (ref 0.3–1.2)
Total Protein: 8.7 g/dL — ABNORMAL HIGH (ref 6.5–8.1)

## 2018-03-21 LAB — CBC WITH DIFFERENTIAL/PLATELET
Basophils Absolute: 0 10*3/uL (ref 0.0–0.1)
Basophils Relative: 0 %
Eosinophils Absolute: 0.2 10*3/uL (ref 0.0–0.5)
Eosinophils Relative: 1 %
HCT: 27.8 % — ABNORMAL LOW (ref 39.0–52.0)
Hemoglobin: 10.3 g/dL — ABNORMAL LOW (ref 13.0–17.0)
Lymphocytes Relative: 24 %
Lymphs Abs: 3.9 10*3/uL (ref 0.7–4.0)
MCH: 42.9 pg — ABNORMAL HIGH (ref 26.0–34.0)
MCHC: 37.1 g/dL — ABNORMAL HIGH (ref 30.0–36.0)
MCV: 115.8 fL — ABNORMAL HIGH (ref 80.0–100.0)
Monocytes Absolute: 1.3 10*3/uL — ABNORMAL HIGH (ref 0.1–1.0)
Monocytes Relative: 8 %
Neutro Abs: 10.9 10*3/uL — ABNORMAL HIGH (ref 1.7–7.7)
Neutrophils Relative %: 67 %
Platelets: 417 10*3/uL — ABNORMAL HIGH (ref 150–400)
RBC: 2.4 MIL/uL — ABNORMAL LOW (ref 4.22–5.81)
RDW: 22.8 % — ABNORMAL HIGH (ref 11.5–15.5)
WBC: 16.3 10*3/uL — ABNORMAL HIGH (ref 4.0–10.5)
nRBC: 2.9 % — ABNORMAL HIGH (ref 0.0–0.2)

## 2018-03-21 LAB — RETICULOCYTES
Immature Retic Fract: 22.8 % — ABNORMAL HIGH (ref 2.3–15.9)
RBC.: 2.4 MIL/uL — ABNORMAL LOW (ref 4.22–5.81)
Retic Count, Absolute: 505.4 10*3/uL — ABNORMAL HIGH (ref 19.0–186.0)
Retic Ct Pct: 19.4 % — ABNORMAL HIGH (ref 0.4–3.1)

## 2018-03-21 MED ORDER — HYDROMORPHONE HCL 1 MG/ML IJ SOLN
1.0000 mg | Freq: Once | INTRAMUSCULAR | Status: AC
  Administered 2018-03-21: 1 mg via INTRAVENOUS
  Filled 2018-03-21: qty 1

## 2018-03-21 MED ORDER — DIPHENHYDRAMINE HCL 50 MG/ML IJ SOLN
25.0000 mg | Freq: Once | INTRAMUSCULAR | Status: AC
  Administered 2018-03-21: 25 mg via INTRAVENOUS
  Filled 2018-03-21: qty 1

## 2018-03-21 MED ORDER — HYDROMORPHONE HCL 2 MG/ML IJ SOLN
2.0000 mg | Freq: Once | INTRAMUSCULAR | Status: AC
  Administered 2018-03-21: 2 mg via INTRAVENOUS
  Filled 2018-03-21: qty 1

## 2018-03-21 NOTE — ED Provider Notes (Signed)
[facility]-EMERGENCY DEPT Provider Note: Lowella Dell, MD, FACEP  CSN: [id number] MRN: [id number] ARRIVAL: 03/21/18 at 0243 ROOM: WA18/WA18   CHIEF COMPLAINT  Sickle Cell Pain Crisis   HISTORY OF PRESENT ILLNESS  03/21/18 3:58 AM Martin [name] is a 27 y.o. male with a history of sickle cell disease.  He is here with pain "all over" that began yesterday morning.  His home medication has not helped.  He denies shortness of breath or chest pain.  He is also having generalized itching with dermatographia at sites of scratching.  He rates his pain as a 10 out of 10.   Past Medical History:  Diagnosis Date  . Anxiety   . HIV (human immunodeficiency virus infection) (HCC)   . Sickle cell crisis (HCC)   . 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HCC)     History reviewed. No pertinent surgical history.  History reviewed. No pertinent family history.  Social History   Tobacco Use  . Smoking status: Never Smoker  . Smokeless tobacco: Never Used  Substance Use Topics  . Alcohol use: No  . Drug use: No    Prior to Admission medications   Medication Sig Start Date End Date Taking? Authorizing Provider  abacavir-dolutegravir-lamiVUDine (TRIUMEQ) 600-50-300 MG tablet Take 1 tablet by mouth daily.   Yes [provider]  ALPRAZolam Prudy Feeler) 1 MG tablet Take 1 mg by mouth 2 (two) times daily as needed for anxiety.  12/03/16  Yes [provider]  diphenhydrAMINE (BENADRYL) 25 MG tablet Take 25 mg by mouth every 6 (six) hours as needed for allergies.   Yes [provider]  hydroxyurea (HYDREA) 500 MG capsule Take 2,000 mg by mouth at bedtime. 12/28/15  Yes [provider]  ibuprofen (ADVIL,MOTRIN) 200 MG tablet Take 400 mg by mouth every 6 (six) hours as needed for moderate pain.    Yes [provider]  Multiple Vitamin (MULTIVITAMIN WITH MINERALS) TABS tablet Take 1 tablet by mouth daily.   Yes [provider]  oxyCODONE-acetaminophen (PERCOCET) 10-325 MG  tablet Take 1 tablet by mouth every 6 (six) hours as needed for pain.  01/05/17  Yes [provider]    Allergies Ketorolac   REVIEW OF SYSTEMS  Negative except as noted here or in the History of Present Illness.   PHYSICAL EXAMINATION  Initial Vital Signs Blood pressure 114/78, pulse (!) 105, temperature 98 F (36.7 C), temperature source Oral, resp. rate 16, SpO2 94 %.  Examination General: Well-developed, well-nourished male in no acute distress; appearance consistent with age of record HENT: normocephalic; atraumatic Eyes: pupils equal, round and reactive to light; extraocular muscles intact; scleral icterus Neck: supple Heart: regular rate and rhythm Lungs: clear to auscultation bilaterally Abdomen: soft; nondistended; nontender; bowel sounds present Extremities: No deformity; full range of motion; pulses normal Neurologic: Awake, alert and oriented; motor function intact in all extremities and symmetric; no facial droop Skin: Warm and dry; dermatographism is him at sites of scratching Psychiatric: Normal mood and affect   RESULTS  Summary of this visit's results, reviewed by myself:   EKG Interpretation  Date/Time:    Ventricular Rate:    PR Interval:    QRS Duration:   QT Interval:    QTC Calculation:   R Axis:     Text Interpretation:        Laboratory Studies: Results for orders placed or performed during the hospital encounter of 03/21/18 (from the past 24 hour(s))  Comprehensive metabolic panel  Status: Abnormal   Collection Time: 03/21/18  3:24 AM  Result Value Ref Range   Sodium 140 135 - 145 mmol/L   Potassium 3.5 3.5 - 5.1 mmol/L   Chloride 109 98 - 111 mmol/L   CO2 21 (L) 22 - 32 mmol/L   Glucose, Bld 94 70 - 99 mg/dL   BUN 8 6 - 20 mg/dL   Creatinine, Ser 0.451.08 0.61 - 1.24 mg/dL   Calcium 9.0 8.9 - 40.910.3 mg/dL   Total Protein 8.7 (H) 6.5 - 8.1 g/dL   Albumin 4.6 3.5 - 5.0 g/dL   AST 30 15 - 41 U/L   ALT 25 0 - 44 U/L   Alkaline  Phosphatase 52 38 - 126 U/L   Total Bilirubin 8.8 (H) 0.3 - 1.2 mg/dL   GFR calc non Af Amer >60 >60 mL/min   GFR calc Af Amer >60 >60 mL/min   Anion gap 10 5 - 15  CBC with Differential     Status: Abnormal   Collection Time: 03/21/18  3:24 AM  Result Value Ref Range   WBC 16.3 (H) 4.0 - 10.5 K/uL   RBC 2.40 (L) 4.22 - 5.81 MIL/uL   Hemoglobin 10.3 (L) 13.0 - 17.0 g/dL   HCT 81.127.8 (L) 91.439.0 - 78.252.0 %   MCV 115.8 (H) 80.0 - 100.0 fL   MCH 42.9 (H) 26.0 - 34.0 pg   MCHC 37.1 (H) 30.0 - 36.0 g/dL   RDW 95.622.8 (H) 21.311.5 - 08.615.5 %   Platelets 417 (H) 150 - 400 K/uL   nRBC 2.9 (H) 0.0 - 0.2 %   Neutrophils Relative % 67 %   Neutro Abs 10.9 (H) 1.7 - 7.7 K/uL   Lymphocytes Relative 24 %   Lymphs Abs 3.9 0.7 - 4.0 K/uL   Monocytes Relative 8 %   Monocytes Absolute 1.3 (H) 0.1 - 1.0 K/uL   Eosinophils Relative 1 %   Eosinophils Absolute 0.2 0.0 - 0.5 K/uL   Basophils Relative 0 %   Basophils Absolute 0.0 0.0 - 0.1 K/uL   RBC Morphology Sickle cells present   Reticulocytes     Status: Abnormal   Collection Time: 03/21/18  3:24 AM  Result Value Ref Range   Retic Ct Pct 19.4 (H) 0.4 - 3.1 %   RBC. 2.40 (L) 4.22 - 5.81 MIL/uL   Retic Count, Absolute 505.4 (H) 19.0 - 186.0 K/uL   Immature Retic Fract 22.8 (H) 2.3 - 15.9 %   Imaging Studies: No results found.  ED COURSE and MDM  Nursing notes and initial vitals signs, including pulse oximetry, reviewed.  Vitals:   03/21/18 0500 03/21/18 0530 03/21/18 0600 03/21/18 0603  BP: 101/66 102/66 112/71 112/71  Pulse: 97 100 (!) 106 (!) 103  Resp:  18 15 16   Temp:      TempSrc:      SpO2: 96% 95% 95% 94%  Weight:       6:11 AM States pain down to 5 out of 10 after two doses of Dilaudid.  Patient states he is ready to go home after third dose.   PROCEDURES    ED DIAGNOSES     ICD-10-CM   1. Sickle cell pain crisis (HCC) D57.00        Halana Deisher, Jonny RuizJohn, MD 03/21/18 [phone]

## 2018-03-21 NOTE — ED Triage Notes (Signed)
Pt reports sickle cell pain "all over" that started this morning. Reports home meds have not helped. Denies SOB or chest pain. A&Ox4. Pt unable to sit still and in obvious discomfort.

## 2018-03-21 NOTE — ED Notes (Signed)
Patient verbalized that he had a ride home.

## 2018-04-18 ENCOUNTER — Other Ambulatory Visit: Payer: Self-pay

## 2018-04-18 ENCOUNTER — Emergency Department (HOSPITAL_COMMUNITY)
Admission: EM | Admit: 2018-04-18 | Discharge: 2018-04-18 | Disposition: A | Discharge status: Home / Self Care | Payer: Medicaid - Out of State | Attending: Emergency Medicine | Admitting: Emergency Medicine

## 2018-04-18 ENCOUNTER — Encounter (HOSPITAL_COMMUNITY): Payer: Self-pay | Admitting: Emergency Medicine

## 2018-04-18 DIAGNOSIS — D57219 Sickle-cell/Hb-C disease with crisis, unspecified: Secondary | ICD-10-CM | POA: Insufficient documentation

## 2018-04-18 DIAGNOSIS — Z79899 Other long term (current) drug therapy: Secondary | ICD-10-CM | POA: Diagnosis not present

## 2018-04-18 DIAGNOSIS — R05 Cough: Secondary | ICD-10-CM

## 2018-04-18 DIAGNOSIS — D5700 Hb-SS disease with crisis, unspecified: Secondary | ICD-10-CM

## 2018-04-18 DIAGNOSIS — R059 Cough, unspecified: Secondary | ICD-10-CM

## 2018-04-18 LAB — CBC WITH DIFFERENTIAL/PLATELET
Abs Immature Granulocytes: 0.05 10*3/uL (ref 0.00–0.07)
Basophils Absolute: 0 10*3/uL (ref 0.0–0.1)
Basophils Relative: 0 %
Eosinophils Absolute: 0.2 10*3/uL (ref 0.0–0.5)
Eosinophils Relative: 2 %
HCT: 23.7 % — ABNORMAL LOW (ref 39.0–52.0)
Hemoglobin: 8.9 g/dL — ABNORMAL LOW (ref 13.0–17.0)
Immature Granulocytes: 1 %
Lymphocytes Relative: 43 %
Lymphs Abs: 4.7 10*3/uL — ABNORMAL HIGH (ref 0.7–4.0)
MCH: 45.6 pg — ABNORMAL HIGH (ref 26.0–34.0)
MCHC: 36.2 g/dL — ABNORMAL HIGH (ref 30.0–36.0)
MCV: 121.5 fL — ABNORMAL HIGH (ref 80.0–100.0)
Monocytes Absolute: 0.8 10*3/uL (ref 0.1–1.0)
Monocytes Relative: 8 %
Neutro Abs: 5.2 10*3/uL (ref 1.7–7.7)
Neutrophils Relative %: 46 %
Platelets: 392 10*3/uL (ref 150–400)
RBC: 1.95 MIL/uL — ABNORMAL LOW (ref 4.22–5.81)
RDW: 25.8 % — ABNORMAL HIGH (ref 11.5–15.5)
WBC: 11.1 10*3/uL — ABNORMAL HIGH (ref 4.0–10.5)
nRBC: 24.8 % — ABNORMAL HIGH (ref 0.0–0.2)

## 2018-04-18 LAB — COMPREHENSIVE METABOLIC PANEL
ALT: 24 U/L (ref 0–44)
AST: 33 U/L (ref 15–41)
Albumin: 3.8 g/dL (ref 3.5–5.0)
Alkaline Phosphatase: 48 U/L (ref 38–126)
Anion gap: 12 (ref 5–15)
BUN: <5 mg/dL — ABNORMAL LOW (ref 6–20)
CO2: 20 mmol/L — ABNORMAL LOW (ref 22–32)
Calcium: 8.3 mg/dL — ABNORMAL LOW (ref 8.9–10.3)
Chloride: 108 mmol/L (ref 98–111)
Creatinine, Ser: 0.77 mg/dL (ref 0.61–1.24)
GFR calc Af Amer: >60 mL/min (ref >60)
GFR calc non Af Amer: >60 mL/min (ref >60)
Glucose, Bld: 93 mg/dL (ref 70–99)
Potassium: 3.2 mmol/L — ABNORMAL LOW (ref 3.5–5.1)
Sodium: 140 mmol/L (ref 135–145)
Total Bilirubin: 4 mg/dL — ABNORMAL HIGH (ref 0.3–1.2)
Total Protein: 7.5 g/dL (ref 6.5–8.1)

## 2018-04-18 LAB — RETICULOCYTES
Immature Retic Fract: 29.9 % — ABNORMAL HIGH (ref 2.3–15.9)
RBC.: 1.95 MIL/uL — ABNORMAL LOW (ref 4.22–5.81)
Retic Count, Absolute: 387.9 10*3/uL — ABNORMAL HIGH (ref 19.0–186.0)
Retic Ct Pct: 20.5 % — ABNORMAL HIGH (ref 0.4–3.1)

## 2018-04-18 MED ORDER — HYDROMORPHONE HCL 1 MG/ML IJ SOLN: 2.0000 mg | INTRAMUSCULAR | Status: AC

## 2018-04-18 MED ORDER — HYDROMORPHONE HCL 1 MG/ML IJ SOLN
2.0000 mg | INTRAMUSCULAR | Status: AC
  Administered 2018-04-18 (×2): 2 mg via INTRAVENOUS
  Filled 2018-04-18 (×2): qty 2

## 2018-04-18 MED ORDER — DIPHENHYDRAMINE HCL 25 MG PO CAPS
25.0000 mg | ORAL_CAPSULE | ORAL | Status: DC | PRN
  Filled 2018-04-18: qty 2

## 2018-04-18 MED ORDER — HYDROMORPHONE HCL 1 MG/ML IJ SOLN
2.0000 mg | INTRAMUSCULAR | Status: AC
  Administered 2018-04-18: 2 mg via INTRAVENOUS
  Filled 2018-04-18: qty 2

## 2018-04-18 MED ORDER — DEXTROSE-NACL 5-0.45 % IV SOLN
INTRAVENOUS | Status: DC
  Administered 2018-04-18: 09:00:00 via INTRAVENOUS

## 2018-04-18 MED ORDER — DIPHENHYDRAMINE HCL 50 MG/ML IJ SOLN
25.0000 mg | Freq: Once | INTRAMUSCULAR | Status: AC
  Administered 2018-04-18: 25 mg via INTRAVENOUS
  Filled 2018-04-18: qty 1

## 2018-04-18 MED ORDER — BENZONATATE 100 MG PO CAPS: 100.0000 mg | ORAL_CAPSULE | Freq: Three times a day (TID) | ORAL | 0 refills | Status: DC

## 2018-04-18 MED ORDER — GUAIFENESIN-CODEINE 100-10 MG/5ML PO SOLN: 5.0000 mL | Freq: Three times a day (TID) | ORAL | 0 refills | Status: DC | PRN

## 2018-04-18 NOTE — ED Notes (Signed)
Attempted IV access X2, will have second RN attempt before consulting IV team.

## 2018-04-18 NOTE — Discharge Instructions (Signed)
Your work-up today showed was consistent with a sickle cell pain crisis.  For your cough, we offered x-ray however that is not something you are interested in at this time.  Please use the cough medicine we discussed and follow-up with your primary sickle cell team and PCP.  If any symptoms change or worsen, please return to the nearest emergency department.

## 2018-04-18 NOTE — ED Provider Notes (Signed)
[facility]   CSN: [id number] Arrival date & time: 04/18/18  0458     History   Chief Complaint Chief Complaint  Patient presents with  . Sickle Cell Pain Crisis    HPI Martin [name] is a 27 y.o. male.  The history is provided by the patient and medical records. No language interpreter was used.  Sickle Cell Pain Crisis  Location:  Hip and back Severity:  Severe Onset quality:  Gradual Duration:  2 days Similar to previous crisis episodes: yes   Timing:  Constant Progression:  Waxing and waning Chronicity:  Recurrent Context: not infection   Relieved by:  Nothing Worsened by:  Nothing Ineffective treatments:  Prescription drugs, OTC medications and rest Associated symptoms: cough   Associated symptoms: no chest pain, no congestion, no fatigue, no fever, no headaches, no leg ulcers, no nausea, no priapism, no shortness of breath, no swelling of legs, no vision change, no vomiting and no wheezing     Past Medical History:  Diagnosis Date  . Anxiety   . HIV (human immunodeficiency virus infection) (HCC)   . Sickle cell crisis (HCC)   . Sickle cell disease [facility](HCC)     Patient Active Problem List   Diagnosis Date Noted  . Heart murmur 02/03/2017  . Sickle cell disease with crisis (HCC) 02/02/2017  . 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 Sickle cell crisis (HCC) 02/02/2017  . 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HCC) 12/20/2016  . Vitamin D deficiency 08/14/2016  . High risk medication use 09/27/2014  . Generalized anxiety disorder 05/19/2014  . GERD (gastroesophageal reflux disease) 05/19/2014  . HIV (human immunodeficiency virus infection) (HCC) 05/23/2012    History reviewed. No pertinent surgical history.      Home Medications    Prior to Admission medications   Medication Sig Start Date End Date Taking? Authorizing Provider  abacavir-dolutegravir-lamiVUDine (TRIUMEQ) 600-50-300 MG tablet Take 1 tablet by  mouth daily.    [provider]  ALPRAZolam Prudy Feeler(XANAX) 1 MG tablet Take 1 mg by mouth 2 (two) times daily as needed for anxiety.  12/03/16   [provider]  diphenhydrAMINE (BENADRYL) 25 MG tablet Take 25 mg by mouth every 6 (six) hours as needed for allergies.    [provider]  hydroxyurea (HYDREA) 500 MG capsule Take 2,000 mg by mouth at bedtime. 12/28/15   [provider]  ibuprofen (ADVIL,MOTRIN) 200 MG tablet Take 400 mg by mouth every 6 (six) hours as needed for moderate pain.     [provider]  Multiple Vitamin (MULTIVITAMIN WITH MINERALS) TABS tablet Take 1 tablet by mouth daily.    [provider]  oxyCODONE-acetaminophen (PERCOCET) 10-325 MG tablet Take 1 tablet by mouth every 6 (six) hours as needed for pain.  01/05/17   [provider]    Family History No family history on file.  Social History Social History   Tobacco Use  . Smoking status: Never Smoker  . Smokeless tobacco: Never Used  Substance Use Topics  . Alcohol use: No  . Drug use: No     Allergies   Ketorolac   Review of Systems Review of Systems  Constitutional: Negative for chills, diaphoresis, fatigue and fever.  HENT: Negative for congestion.   Eyes: Negative for visual disturbance.  Respiratory: Positive for cough. Negative for choking, chest tightness, shortness of breath and wheezing.   Cardiovascular: Negative for chest pain, palpitations and leg swelling.  Gastrointestinal: Negative for constipation, diarrhea, nausea  and vomiting.  Genitourinary: Negative for dysuria and flank pain.  Musculoskeletal: Positive for back pain. Negative for neck pain and neck stiffness.  Skin: Negative for rash and wound.  Neurological: Negative for syncope, weakness, light-headedness, numbness and headaches.  Psychiatric/Behavioral: Negative for agitation.  All other systems reviewed and are negative.    Physical Exam Updated Vital Signs BP (!)  101/58   Pulse 95   Temp 98.4 F (36.9 C) (Oral)   Resp 17   SpO2 97%   Physical Exam Vitals signs and nursing note reviewed.  Constitutional:      General: He is not in acute distress.    Appearance: He is well-developed. He is not ill-appearing, toxic-appearing or diaphoretic.  HENT:     Head: Normocephalic and atraumatic.     Nose: No congestion or rhinorrhea.     Mouth/Throat:     Pharynx: No oropharyngeal exudate.  Eyes:     Conjunctiva/sclera: Conjunctivae normal.  Neck:     Musculoskeletal: Normal range of motion and neck supple.  Cardiovascular:     Rate and Rhythm: Normal rate and regular rhythm.     Pulses: Normal pulses.  Pulmonary:     Effort: Pulmonary effort is normal. No respiratory distress.     Breath sounds: Normal breath sounds. No wheezing, rhonchi or rales.  Chest:     Chest wall: No tenderness.  Abdominal:     General: There is no distension.     Palpations: Abdomen is soft.     Tenderness: There is no abdominal tenderness.  Musculoskeletal:        General: Tenderness present. No swelling.     Thoracic back: He exhibits tenderness and pain.     Lumbar back: He exhibits tenderness and pain.       Back:  Skin:    General: Skin is warm and dry.     Capillary Refill: Capillary refill takes less than 2 seconds.  Neurological:     General: No focal deficit present.     Mental Status: He is alert.  Psychiatric:        Mood and Affect: Mood normal.      ED Treatments / Results  Labs (all labs ordered are listed, but only abnormal results are displayed) Labs Reviewed  COMPREHENSIVE METABOLIC PANEL - Abnormal; Notable for the following components:      Result Value   Potassium 3.2 (*)    CO2 20 (*)    BUN <5 (*)    Calcium 8.3 (*)    Total Bilirubin 4.0 (*)    All other components within normal limits  CBC WITH DIFFERENTIAL/PLATELET - Abnormal; Notable for the following components:   WBC 11.1 (*)    RBC 1.95 (*)    Hemoglobin 8.9 (*)     HCT 23.7 (*)    MCV 121.5 (*)    MCH 45.6 (*)    MCHC 36.2 (*)    RDW 25.8 (*)    nRBC 24.8 (*)    Lymphs Abs 4.7 (*)    All other components within normal limits  RETICULOCYTES - Abnormal; Notable for the following components:   Retic Ct Pct 20.5 (*)    RBC. 1.95 (*)    Retic Count, Absolute 387.9 (*)    Immature Retic Fract 29.9 (*)    All other components within normal limits  CBC WITH DIFFERENTIAL/PLATELET    EKG None  Radiology No results found.  Procedures Procedures (including critical care time)  Medications Ordered  in ED Medications  dextrose 5 %-0.45 % sodium chloride infusion ( Intravenous New Bag/Given 04/18/18 0838)  HYDROmorphone (DILAUDID) injection 2 mg (2 mg Intravenous Given 04/18/18 0844)    Or  HYDROmorphone (DILAUDID) injection 2 mg ( Subcutaneous See Alternative 04/18/18 0844)  HYDROmorphone (DILAUDID) injection 2 mg (2 mg Intravenous Given 04/18/18 0936)    Or  HYDROmorphone (DILAUDID) injection 2 mg ( Subcutaneous See Alternative 04/18/18 0936)  HYDROmorphone (DILAUDID) injection 2 mg (2 mg Intravenous Given 04/18/18 1045)    Or  HYDROmorphone (DILAUDID) injection 2 mg ( Subcutaneous See Alternative 04/18/18 1045)  HYDROmorphone (DILAUDID) injection 2 mg (2 mg Intravenous Given 04/18/18 1245)    Or  HYDROmorphone (DILAUDID) injection 2 mg ( Subcutaneous See Alternative 04/18/18 1245)  diphenhydrAMINE (BENADRYL) injection 25 mg (25 mg Intravenous Given 04/18/18 0843)  diphenhydrAMINE (BENADRYL) injection 25 mg (25 mg Intravenous Given 04/18/18 1132)     Initial Impression / Assessment and Plan / ED Course  I have reviewed the triage vital signs and the nursing notes.  Pertinent labs & imaging results that were available during my care of the patient were reviewed by me and considered in my medical decision making (see chart for details).     Martin [name] is a 27 y.o. male with past medical history significant for sickle cell disease  and HIV who presents with back pain and bilateral hip pain consistent with prior pain crisis.  Patient reports that for the last few days he has been having pain in his back and hips primarily.  He reports is the same distribution of prior pain crisis.  He denies fevers, chills, congestion, or chest pain.  Does report a dry cough but does not feel like he has pneumonia or acute chest syndrome.  Denies fevers, chills, urinary symptoms or GI symptoms.  Patient says his home pain medicines have not been helping and he reports his pain is 10 out of 10.  He reports this is consistent with prior episodes and he feels that he may be able to go home if symptoms improve.  He denies other abdominal pain chest pain or other complaints.  On exam, patient has diffuse back tenderness.  Abdomen and chest are nontender.  No focal neurologic deficit seen on exam.  Lungs clear.  Patient resting comfortably.  Suspect sickle cell pain crisis.  As patient does not want chest x-ray due to lack of fevers chills or chest pain, will hold on x-ray.  If symptoms worsen will consider chest x-ray given his mild dry cough.  Low suspicion for acute chest at this time.  Patient feels that after dose of medication he will likely be safe for discharge home, will give him pain medicine and Benadryl for itching.  He denies any nausea and does not want nausea medicine at this time.  Chart review shows the patient is allergic to Toradol.  Anticipate reassessment and discharge if symptoms improve for sickle cell pain crisis.  If symptoms not improve, will consider further work-up and admission.  10:43 AM Lab testing for similar to prior.  Anticipate reassessment after medications to see if he is able to go home or not.  2:24 PM Patient reports his symptoms have improved significantly.  He still is having a mild cough but he does not want a chest x-ray.  He asked for a cough syrup prescription.  This will be ordered.  Patient will be  discharged to follow-up with his PCP and sickle cell team.  Patient  received return precautions and follow-up instructions.  Patient otherwise or concerns and was discharged in good condition.   Final Clinical Impressions(s) / ED Diagnoses   Final diagnoses:  Sickle cell pain crisis (HCC)  Cough    ED Discharge Orders         Ordered    benzonatate (TESSALON) 100 MG capsule  Every 8 hours,   Status:  Discontinued     04/18/18 1424    guaiFENesin-codeine 100-10 MG/5ML syrup  3 times daily PRN     04/18/18 1425          Clinical Impression: 1. Sickle cell pain crisis (HCC)   2. Cough     Disposition: Discharge  Condition: Good  I have discussed the results, Dx and Tx plan with the pt(& family if present). He/she/they expressed understanding and agree(s) with the plan. Discharge instructions discussed at great length. Strict return precautions discussed and pt &/or family have verbalized understanding of the instructions. No further questions at time of discharge.    New Prescriptions   GUAIFENESIN-CODEINE 100-10 MG/5ML SYRUP    Take 5 mLs by mouth 3 (three) times daily as needed for cough.    Follow Up: [facility] [address] [place] [phone] Schedule an appointment as soon as possible for a visit    [facility] [address] [id number] [place] [id number] [phone]       Elga Santy, Canary Brimhristopher J, MD 04/18/18 1426

## 2018-04-18 NOTE — ED Notes (Signed)
Pt provided with Mal[place] sandwich and coke. Resting comfortably at this time. No distress noted.

## 2018-04-18 NOTE — ED Triage Notes (Signed)
C/o sickle cell pain all over since yesterday

## 2018-04-29 ENCOUNTER — Emergency Department (HOSPITAL_COMMUNITY): Payer: Medicaid - Out of State

## 2018-04-29 ENCOUNTER — Other Ambulatory Visit: Payer: Self-pay

## 2018-04-29 ENCOUNTER — Emergency Department (HOSPITAL_COMMUNITY)
Admission: EM | Admit: 2018-04-29 | Discharge: 2018-04-29 | Disposition: A | Discharge status: Home / Self Care | Payer: Medicaid - Out of State | Attending: Emergency Medicine | Admitting: Emergency Medicine

## 2018-04-29 ENCOUNTER — Encounter (HOSPITAL_COMMUNITY): Payer: Self-pay | Admitting: *Deleted

## 2018-04-29 DIAGNOSIS — J209 Acute bronchitis, unspecified: Secondary | ICD-10-CM | POA: Diagnosis not present

## 2018-04-29 DIAGNOSIS — D5700 Hb-SS disease with crisis, unspecified: Secondary | ICD-10-CM | POA: Insufficient documentation

## 2018-04-29 DIAGNOSIS — M7918 Myalgia, other site: Secondary | ICD-10-CM | POA: Diagnosis present

## 2018-04-29 DIAGNOSIS — Z79899 Other long term (current) drug therapy: Secondary | ICD-10-CM | POA: Insufficient documentation

## 2018-04-29 DIAGNOSIS — B20 Human immunodeficiency virus [HIV] disease: Secondary | ICD-10-CM | POA: Insufficient documentation

## 2018-04-29 LAB — CBC WITH DIFFERENTIAL/PLATELET
Abs Immature Granulocytes: 0.04 10*3/uL (ref 0.00–0.07)
Basophils Absolute: 0 10*3/uL (ref 0.0–0.1)
Basophils Relative: 0 %
Eosinophils Absolute: 0.2 10*3/uL (ref 0.0–0.5)
Eosinophils Relative: 2 %
HCT: 25.7 % — ABNORMAL LOW (ref 39.0–52.0)
Hemoglobin: 8.9 g/dL — ABNORMAL LOW (ref 13.0–17.0)
Immature Granulocytes: 0 %
Lymphocytes Relative: 62 %
Lymphs Abs: 5.9 10*3/uL — ABNORMAL HIGH (ref 0.7–4.0)
MCH: 38.9 pg — ABNORMAL HIGH (ref 26.0–34.0)
MCHC: 34.6 g/dL (ref 30.0–36.0)
MCV: 112.2 fL — ABNORMAL HIGH (ref 80.0–100.0)
Monocytes Absolute: 1.4 10*3/uL — ABNORMAL HIGH (ref 0.1–1.0)
Monocytes Relative: 15 %
Neutro Abs: 2 10*3/uL (ref 1.7–7.7)
Neutrophils Relative %: 21 %
Platelets: 400 10*3/uL (ref 150–400)
RBC: 2.29 MIL/uL — ABNORMAL LOW (ref 4.22–5.81)
RDW: 23.4 % — ABNORMAL HIGH (ref 11.5–15.5)
WBC: 9.5 10*3/uL (ref 4.0–10.5)
nRBC: 49.6 % — ABNORMAL HIGH (ref 0.0–0.2)

## 2018-04-29 LAB — COMPREHENSIVE METABOLIC PANEL
ALT: 33 U/L (ref 0–44)
AST: 29 U/L (ref 15–41)
Albumin: 4 g/dL (ref 3.5–5.0)
Alkaline Phosphatase: 53 U/L (ref 38–126)
Anion gap: 7 (ref 5–15)
BUN: 8 mg/dL (ref 6–20)
CO2: 24 mmol/L (ref 22–32)
Calcium: 8.7 mg/dL — ABNORMAL LOW (ref 8.9–10.3)
Chloride: 110 mmol/L (ref 98–111)
Creatinine, Ser: 0.65 mg/dL (ref 0.61–1.24)
GFR calc Af Amer: >60 mL/min (ref >60)
GFR calc non Af Amer: >60 mL/min (ref >60)
Glucose, Bld: 95 mg/dL (ref 70–99)
Potassium: 4 mmol/L (ref 3.5–5.1)
Sodium: 141 mmol/L (ref 135–145)
Total Bilirubin: 3.9 mg/dL — ABNORMAL HIGH (ref 0.3–1.2)
Total Protein: 7.9 g/dL (ref 6.5–8.1)

## 2018-04-29 LAB — RETICULOCYTES
Immature Retic Fract: 37.9 % — ABNORMAL HIGH (ref 2.3–15.9)
RBC.: 2.29 MIL/uL — ABNORMAL LOW (ref 4.22–5.81)
Retic Count, Absolute: 451.6 10*3/uL — ABNORMAL HIGH (ref 19.0–186.0)
Retic Ct Pct: 19.7 % — ABNORMAL HIGH (ref 0.4–3.1)

## 2018-04-29 MED ORDER — IPRATROPIUM-ALBUTEROL 0.5-2.5 (3) MG/3ML IN SOLN: 3.0000 mL | RESPIRATORY_TRACT | Status: DC | PRN

## 2018-04-29 MED ORDER — SODIUM CHLORIDE 0.9 % IV BOLUS
1000.0000 mL | Freq: Once | INTRAVENOUS | Status: AC
  Administered 2018-04-29: 1000 mL via INTRAVENOUS

## 2018-04-29 MED ORDER — HYDROMORPHONE HCL 2 MG/ML IJ SOLN
2.0000 mg | Freq: Once | INTRAMUSCULAR | Status: AC
  Administered 2018-04-29: 2 mg via INTRAVENOUS
  Filled 2018-04-29: qty 1

## 2018-04-29 MED ORDER — ALBUTEROL SULFATE HFA 108 (90 BASE) MCG/ACT IN AERS: 2.0000 | INHALATION_SPRAY | RESPIRATORY_TRACT | Status: DC | PRN

## 2018-04-29 MED ORDER — IPRATROPIUM-ALBUTEROL 0.5-2.5 (3) MG/3ML IN SOLN
3.0000 mL | RESPIRATORY_TRACT | Status: DC
  Administered 2018-04-29: 3 mL via RESPIRATORY_TRACT
  Filled 2018-04-29: qty 3

## 2018-04-29 MED ORDER — OXYCODONE-ACETAMINOPHEN 5-325 MG PO TABS
2.0000 | ORAL_TABLET | Freq: Once | ORAL | Status: AC
  Administered 2018-04-29: 2 via ORAL
  Filled 2018-04-29: qty 2

## 2018-04-29 MED ORDER — DIPHENHYDRAMINE HCL 50 MG/ML IJ SOLN
25.0000 mg | Freq: Once | INTRAMUSCULAR | Status: AC
  Administered 2018-04-29: 25 mg via INTRAVENOUS
  Filled 2018-04-29: qty 1

## 2018-04-29 MED ORDER — ONDANSETRON HCL 4 MG/2ML IJ SOLN
4.0000 mg | Freq: Once | INTRAMUSCULAR | Status: AC
  Administered 2018-04-29: 4 mg via INTRAVENOUS
  Filled 2018-04-29: qty 2

## 2018-04-29 NOTE — ED Provider Notes (Signed)
[facility]-EMERGENCY DEPT Provider Note: Lowella Dell, MD, FACEP  CSN: [id number] MRN: [id number] ARRIVAL: 04/29/18 at 0249 ROOM: WA13/WA13   CHIEF COMPLAINT  Sickle Cell Pain Crisis   HISTORY OF PRESENT ILLNESS  04/29/18 4:26 AM Martin [name] is a 28 y.o. male with history of sickle cell disease.  He is here complaining of generalized pain which began about midnight.  He states he came to town to visit his family and did not bring any of his medications with him.  He has taken Tylenol without improvement.  He denies fever, nausea, vomiting or diarrhea.  He has had a cough.    Past Medical History:  Diagnosis Date  . Anxiety   . HIV (human immunodeficiency virus infection) (HCC)   . Sickle cell crisis (HCC)   . 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HCC)     History reviewed. No pertinent surgical history.  No family history on file.  Social History   Tobacco Use  . Smoking status: Never Smoker  . Smokeless tobacco: Never Used  Substance Use Topics  . Alcohol use: No  . Drug use: No    Prior to Admission medications   Medication Sig Start Date End Date Taking? Authorizing Provider  abacavir-dolutegravir-lamiVUDine (TRIUMEQ) 600-50-300 MG tablet Take 1 tablet by mouth daily.   Yes [provider]  ALPRAZolam Prudy Feeler) 1 MG tablet Take 1 mg by mouth 2 (two) times daily as needed for anxiety.  12/03/16  Yes [provider]  diphenhydrAMINE (BENADRYL) 25 MG tablet Take 25 mg by mouth every 6 (six) hours as needed for allergies.   Yes [provider]  hydroxyurea (HYDREA) 500 MG capsule Take 2,000 mg by mouth at bedtime. 12/28/15  Yes [provider]  ibuprofen (ADVIL,MOTRIN) 200 MG tablet Take 400 mg by mouth every 6 (six) hours as needed for moderate pain.    Yes [provider]  Multiple Vitamin (MULTIVITAMIN WITH MINERALS) TABS tablet Take 1 tablet by mouth daily.   Yes [provider]  oxyCODONE-acetaminophen (PERCOCET) 10-325 MG tablet  Take 1 tablet by mouth every 6 (six) hours as needed for pain.  01/05/17  Yes [provider]  guaiFENesin-codeine 100-10 MG/5ML syrup Take 5 mLs by mouth 3 (three) times daily as needed for cough. Patient not taking: Reported on 04/29/2018 04/18/18   Tegeler, Canary Brim, MD    Allergies Ketorolac   REVIEW OF SYSTEMS  Negative except as noted here or in the History of Present Illness.   PHYSICAL EXAMINATION  Initial Vital Signs Blood pressure 106/71, pulse 67, temperature 98.6 F (37 C), temperature source Oral, resp. rate (!) 25, height 6\' 3"  (1.905 m), weight 68 kg, SpO2 96 %.  Examination General: Well-developed, well-nourished male in no acute distress; appearance consistent with age of record HENT: normocephalic; atraumatic Eyes: pupils equal, round and reactive to light; extraocular muscles intact Neck: supple Heart: regular rate and rhythm Lungs: Expiratory wheezes bilaterally Abdomen: soft; nondistended; nontender; no masses or hepatosplenomegaly; bowel sounds present Extremities: No deformity; full range of motion; pulses normal Neurologic: Awake, alert and oriented; motor function intact in all extremities and symmetric; no facial droop Skin: Warm and dry Psychiatric: Normal mood and affect   RESULTS  Summary of this visit's results, reviewed by myself:   EKG Interpretation  Date/Time:    Ventricular Rate:    PR Interval:    QRS Duration:   QT Interval:    QTC Calculation:   R Axis:     Text Interpretation:  Laboratory Studies: Results for orders placed or performed during the hospital encounter of 04/29/18 (from the past 24 hour(s))  Comprehensive metabolic panel     Status: Abnormal   Collection Time: 04/29/18  3:59 AM  Result Value Ref Range   Sodium 141 135 - 145 mmol/L   Potassium 4.0 3.5 - 5.1 mmol/L   Chloride 110 98 - 111 mmol/L   CO2 24 22 - 32 mmol/L   Glucose, Bld 95 70 - 99 mg/dL   BUN 8 6 - 20 mg/dL   Creatinine, Ser  1.610.65 0.61 - 1.24 mg/dL   Calcium 8.7 (L) 8.9 - 10.3 mg/dL   Total Protein 7.9 6.5 - 8.1 g/dL   Albumin 4.0 3.5 - 5.0 g/dL   AST 29 15 - 41 U/L   ALT 33 0 - 44 U/L   Alkaline Phosphatase 53 38 - 126 U/L   Total Bilirubin 3.9 (H) 0.3 - 1.2 mg/dL   GFR calc non Af Amer >60 >60 mL/min   GFR calc Af Amer >60 >60 mL/min   Anion gap 7 5 - 15  CBC with Differential     Status: Abnormal   Collection Time: 04/29/18  3:59 AM  Result Value Ref Range   WBC 9.5 4.0 - 10.5 K/uL   RBC 2.29 (L) 4.22 - 5.81 MIL/uL   Hemoglobin 8.9 (L) 13.0 - 17.0 g/dL   HCT 09.625.7 (L) 04.539.0 - 40.952.0 %   MCV 112.2 (H) 80.0 - 100.0 fL   MCH 38.9 (H) 26.0 - 34.0 pg   MCHC 34.6 30.0 - 36.0 g/dL   RDW 81.123.4 (H) 91.411.5 - 78.215.5 %   Platelets 400 150 - 400 K/uL   nRBC 49.6 (H) 0.0 - 0.2 %   Neutrophils Relative % 21 %   Neutro Abs 2.0 1.7 - 7.7 K/uL   Lymphocytes Relative 62 %   Lymphs Abs 5.9 (H) 0.7 - 4.0 K/uL   Monocytes Relative 15 %   Monocytes Absolute 1.4 (H) 0.1 - 1.0 K/uL   Eosinophils Relative 2 %   Eosinophils Absolute 0.2 0.0 - 0.5 K/uL   Basophils Relative 0 %   Basophils Absolute 0.0 0.0 - 0.1 K/uL   Immature Granulocytes 0 %   Abs Immature Granulocytes 0.04 0.00 - 0.07 K/uL   Polychromasia PRESENT    Sickle Cells PRESENT    Target Cells PRESENT   Reticulocytes     Status: Abnormal   Collection Time: 04/29/18  3:59 AM  Result Value Ref Range   Retic Ct Pct 19.7 (H) 0.4 - 3.1 %   RBC. 2.29 (L) 4.22 - 5.81 MIL/uL   Retic Count, Absolute 451.6 (H) 19.0 - 186.0 K/uL   Immature Retic Fract 37.9 (H) 2.3 - 15.9 %   Imaging Studies: Dg Chest 2 View  Result Date: 04/29/2018 CLINICAL DATA:  28 y/o  M; cough, wheezing, sickle cell crisis. EXAM: CHEST - 2 VIEW COMPARISON:  11/14/2017 chest radiograph FINDINGS: Stable normal cardiac silhouette given projection and technique. Aortic atherosclerosis with calcification. Stable coarse reticular opacities of the lungs. No focal consolidation. No pleural effusion or  pneumothorax. No acute osseous abnormality is evident. 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 No focal consolidation. Electronically Signed   By: Mitzi HansenLance  Furusawa-Stratton M.D.   On: 04/29/2018 05:46    ED COURSE and MDM  Nursing notes and initial vitals signs, including pulse oximetry, reviewed.  Vitals:   04/29/18 0531 04/29/18 0600 04/29/18 0630  04/29/18 0700  BP: 103/84 108/74 100/65 98/60  Pulse: 70 67 84 83  Resp: 19 17 (!) 22 18  Temp:      TempSrc:      SpO2: 97% 93% 92% 92%  Weight:      Height:       7:09 AM Pain still not fully controlled.  Patient given DuoNeb treatment for bronchospasm. Langston Masker, PA-C will follow up and make disposition.  PROCEDURES    ED DIAGNOSES     ICD-10-CM   1. Sickle cell pain crisis (HCC) D57.00   2. Acute bronchitis with bronchospasm J20.9        Raelle Chambers, MD 05/01/18 1330

## 2018-04-29 NOTE — ED Triage Notes (Signed)
Pt c/o generalized sickle cell pain.  Pt stated "I came to visit family & didn't bring any of my meds with me.  I took tylenol 1,000 @ [place]."

## 2018-04-29 NOTE — ED Notes (Signed)
Iv team at bedside  

## 2018-04-29 NOTE — ED Notes (Signed)
Pt offered subcutaneous dilaudid.  Pt refused stating "Dilaudid makes me itch and I have to have benadryl with it."

## 2018-04-29 NOTE — Discharge Instructions (Addendum)
Follow up with your Physician as scheduled  

## 2018-04-29 NOTE — ED Provider Notes (Signed)
Pt given additional dosage of pain medication.  Pt reports he is feeling better.  Pt reports he left his medication in [place].  Pt is taking percocet 10mg .   Pt advised I will give him a dosage before discharge. Pt advised he needs to get his medication from home.     Martin [name], [place] 04/29/18 7846    Paula Libra, MD 05/01/18 1329

## 2018-04-29 NOTE — ED Notes (Signed)
Patient transported to X-ray 

## 2018-05-20 ENCOUNTER — Other Ambulatory Visit: Payer: Self-pay

## 2018-05-20 ENCOUNTER — Emergency Department (HOSPITAL_COMMUNITY)
Admission: EM | Admit: 2018-05-20 | Discharge: 2018-05-20 | Disposition: A | Discharge status: Home / Self Care | Payer: Medicaid - Out of State | Attending: Emergency Medicine | Admitting: Emergency Medicine

## 2018-05-20 ENCOUNTER — Encounter (HOSPITAL_COMMUNITY): Payer: Self-pay | Admitting: *Deleted

## 2018-05-20 DIAGNOSIS — B20 Human immunodeficiency virus [HIV] disease: Secondary | ICD-10-CM | POA: Diagnosis not present

## 2018-05-20 DIAGNOSIS — Z79899 Other long term (current) drug therapy: Secondary | ICD-10-CM | POA: Insufficient documentation

## 2018-05-20 DIAGNOSIS — D5700 Hb-SS disease with crisis, unspecified: Secondary | ICD-10-CM | POA: Insufficient documentation

## 2018-05-20 LAB — COMPREHENSIVE METABOLIC PANEL
ALT: 25 U/L (ref 0–44)
AST: 40 U/L (ref 15–41)
Albumin: 4.2 g/dL (ref 3.5–5.0)
Alkaline Phosphatase: 50 U/L (ref 38–126)
Anion gap: 6 (ref 5–15)
BUN: 9 mg/dL (ref 6–20)
CO2: 25 mmol/L (ref 22–32)
Calcium: 9.2 mg/dL (ref 8.9–10.3)
Chloride: 110 mmol/L (ref 98–111)
Creatinine, Ser: 0.7 mg/dL (ref 0.61–1.24)
GFR calc Af Amer: >60 mL/min (ref >60)
GFR calc non Af Amer: >60 mL/min (ref >60)
Glucose, Bld: 94 mg/dL (ref 70–99)
Potassium: 4.1 mmol/L (ref 3.5–5.1)
Sodium: 141 mmol/L (ref 135–145)
Total Bilirubin: 6.1 mg/dL — ABNORMAL HIGH (ref 0.3–1.2)
Total Protein: 8.3 g/dL — ABNORMAL HIGH (ref 6.5–8.1)

## 2018-05-20 LAB — CBC WITH DIFFERENTIAL/PLATELET
Abs Immature Granulocytes: 0.05 10*3/uL (ref 0.00–0.07)
Basophils Absolute: 0.1 10*3/uL (ref 0.0–0.1)
Basophils Relative: 0 %
Eosinophils Absolute: 0.4 10*3/uL (ref 0.0–0.5)
Eosinophils Relative: 3 %
HCT: 23.7 % — ABNORMAL LOW (ref 39.0–52.0)
Hemoglobin: 8.7 g/dL — ABNORMAL LOW (ref 13.0–17.0)
Immature Granulocytes: 0 %
Lymphocytes Relative: 45 %
Lymphs Abs: 5.2 10*3/uL — ABNORMAL HIGH (ref 0.7–4.0)
MCH: 44.4 pg — ABNORMAL HIGH (ref 26.0–34.0)
MCHC: 36.7 g/dL — ABNORMAL HIGH (ref 30.0–36.0)
MCV: 120.9 fL — ABNORMAL HIGH (ref 80.0–100.0)
Monocytes Absolute: 1.6 10*3/uL — ABNORMAL HIGH (ref 0.1–1.0)
Monocytes Relative: 14 %
Neutro Abs: 4.3 10*3/uL (ref 1.7–7.7)
Neutrophils Relative %: 38 %
Platelets: 353 10*3/uL (ref 150–400)
RBC: 1.96 MIL/uL — ABNORMAL LOW (ref 4.22–5.81)
RDW: 23.1 % — ABNORMAL HIGH (ref 11.5–15.5)
WBC: 11.5 10*3/uL — ABNORMAL HIGH (ref 4.0–10.5)
nRBC: 11.5 % — ABNORMAL HIGH (ref 0.0–0.2)

## 2018-05-20 LAB — RETICULOCYTES
Immature Retic Fract: 42.7 % — ABNORMAL HIGH (ref 2.3–15.9)
RBC.: 1.96 MIL/uL — ABNORMAL LOW (ref 4.22–5.81)
Retic Count, Absolute: 295.8 10*3/uL — ABNORMAL HIGH (ref 19.0–186.0)
Retic Ct Pct: 15.1 % — ABNORMAL HIGH (ref 0.4–3.1)

## 2018-05-20 MED ORDER — HYDROMORPHONE HCL 2 MG/ML IJ SOLN: 2.0000 mg | INTRAMUSCULAR | Status: AC

## 2018-05-20 MED ORDER — SODIUM CHLORIDE 0.9% FLUSH
3.0000 mL | Freq: Once | INTRAVENOUS | Status: AC
  Administered 2018-05-20: 3 mL via INTRAVENOUS

## 2018-05-20 MED ORDER — SODIUM CHLORIDE 0.45 % IV SOLN
INTRAVENOUS | Status: DC
  Administered 2018-05-20: 07:00:00 via INTRAVENOUS

## 2018-05-20 MED ORDER — DIPHENHYDRAMINE HCL 25 MG PO CAPS
25.0000 mg | ORAL_CAPSULE | ORAL | Status: DC | PRN
  Filled 2018-05-20: qty 1

## 2018-05-20 MED ORDER — HYDROMORPHONE HCL 2 MG/ML IJ SOLN
2.0000 mg | INTRAMUSCULAR | Status: DC
  Administered 2018-05-20: 2 mg via INTRAVENOUS
  Filled 2018-05-20: qty 1

## 2018-05-20 MED ORDER — HYDROMORPHONE HCL 2 MG/ML IJ SOLN
2.0000 mg | INTRAMUSCULAR | Status: AC
  Administered 2018-05-20: 2 mg via INTRAVENOUS
  Filled 2018-05-20: qty 1

## 2018-05-20 MED ORDER — DIPHENHYDRAMINE HCL 50 MG/ML IJ SOLN
25.0000 mg | Freq: Once | INTRAMUSCULAR | Status: AC
  Administered 2018-05-20: 25 mg via INTRAVENOUS
  Filled 2018-05-20: qty 1

## 2018-05-20 MED ORDER — HYDROMORPHONE HCL 2 MG/ML IJ SOLN: 2.0000 mg | INTRAMUSCULAR | Status: DC

## 2018-05-20 MED ORDER — HYDROXYZINE HCL 25 MG PO TABS
25.0000 mg | ORAL_TABLET | Freq: Once | ORAL | Status: AC
  Administered 2018-05-20: 25 mg via ORAL
  Filled 2018-05-20: qty 1

## 2018-05-20 NOTE — ED Triage Notes (Signed)
Pt stated "my pain started getting worse tonight.  I took percocet @ 2200 and 0200.  Everything from the waist down is hurting."

## 2018-05-20 NOTE — ED Provider Notes (Signed)
[facility]-EMERGENCY DEPT Provider Note   CSN: [id number] Arrival date & time: 05/20/18  0417     History   Chief Complaint Chief Complaint  Patient presents with  . Sickle Cell Pain Crisis    HPI Martin [name] is a 28 y.o. male presenting for evaluation of sickle cell pain.  Patient states last night, he started to develop sickle cell pain.  It is in his low back, extending to his lower extremities bilaterally.  This is where he normally has sickle cell pain.  He took his first dose of pain medicine last night around 6 PM, and another dose at 4 AM.  He had no improvement with his home medication.  He denies known trigger, although states it is often triggered by the cold.  He denies fall, trauma, or injury.  He denies fevers, chills, chest pain, shortness breath, cough, nausea, vomiting, domino pain, urinary symptoms, normal bowel movements.  He also has a history of HIV for which he takes medication, his CD4 count is reassuring and viral load undetectable.  He states he has no other medical problems.  Takes no other medications daily.  HPI  Past Medical History:  Diagnosis Date  . Anxiety   . HIV (human immunodeficiency virus infection) (HCC)   . Sickle cell crisis (HCC)   . Sickle cell disease [facility](HCC)     Patient Active Problem List   Diagnosis Date Noted  . Heart murmur 02/03/2017  . Sickle cell disease with crisis (HCC) 02/02/2017  . 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 Sickle cell crisis (HCC) 02/02/2017  . 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HCC) 12/20/2016  . Vitamin D deficiency 08/14/2016  . High risk medication use 09/27/2014  . Generalized anxiety disorder 05/19/2014  . GERD (gastroesophageal reflux disease) 05/19/2014  . HIV (human immunodeficiency virus infection) (HCC) 05/23/2012    History reviewed. No pertinent surgical history.      Home Medications    Prior to Admission medications   Medication Sig Start Date End Date Taking?  Authorizing Provider  abacavir-dolutegravir-lamiVUDine (TRIUMEQ) 600-50-300 MG tablet Take 1 tablet by mouth daily.   Yes [provider]  ALPRAZolam Prudy Feeler(XANAX) 1 MG tablet Take 1 mg by mouth 2 (two) times daily as needed for anxiety.  12/03/16  Yes [provider]  diphenhydrAMINE (BENADRYL) 25 MG tablet Take 25 mg by mouth every 6 (six) hours as needed for allergies.   Yes [provider]  hydroxyurea (HYDREA) 500 MG capsule Take 2,000 mg by mouth at bedtime. 12/28/15  Yes [provider]  ibuprofen (ADVIL,MOTRIN) 200 MG tablet Take 400 mg by mouth every 6 (six) hours as needed for moderate pain.    Yes [provider]  Multiple Vitamin (MULTIVITAMIN WITH MINERALS) TABS tablet Take 1 tablet by mouth daily.   Yes [provider]  oxyCODONE-acetaminophen (PERCOCET) 10-325 MG tablet Take 1 tablet by mouth every 6 (six) hours as needed for pain.  01/05/17  Yes [provider]    Family History No family history on file.  Social History Social History   Tobacco Use  . Smoking status: Never Smoker  . Smokeless tobacco: Never Used  Substance Use Topics  . Alcohol use: No  . Drug use: No     Allergies   Ketorolac   Review of Systems Review of Systems  Musculoskeletal: Positive for myalgias.  All other systems reviewed and are negative.    Physical Exam Updated Vital Signs BP 111/73   Pulse 86  Temp 98.8 F (37.1 C) (Oral)   Resp 14   Ht 6\' 3"  (1.905 m)   Wt 68 kg   SpO2 93%   BMI 18.75 kg/m   Physical Exam Vitals signs and nursing note reviewed.  Constitutional:      General: He is not in acute distress.    Appearance: He is well-developed.     Comments: Pt appears uncomfortable due to pain, in NAD. Appears dehydrated  HENT:     Head: Normocephalic and atraumatic.     Mouth/Throat:     Mouth: Mucous membranes are dry.  Eyes:     Conjunctiva/sclera: Conjunctivae normal.     Pupils: Pupils are equal, round,  and reactive to light.  Neck:     Musculoskeletal: Normal range of motion and neck supple.  Cardiovascular:     Rate and Rhythm: Normal rate and regular rhythm.     Pulses: Normal pulses.  Pulmonary:     Effort: Pulmonary effort is normal. No respiratory distress.     Breath sounds: Normal breath sounds. No wheezing.     Comments: Speaking in full sentences. Clear lung sounds in all fields.  Abdominal:     General: There is no distension.     Palpations: Abdomen is soft. There is no mass.     Tenderness: There is abdominal tenderness. There is no guarding or rebound.     Comments: Mild generalized tenderness palpation the abdomen without rigidity, guarding, distention.  No focal tenderness.  Negative rebound.  No pain at McBurney's, negative Murphy's.  Mild increased pain at epigastric area.  Musculoskeletal: Normal range of motion.        General: Tenderness present.     Comments: Pain with movement of lower extremities bilaterally. No significant edema of lower extremities. Pedal pulses intact.   Skin:    General: Skin is warm and dry.     Capillary Refill: Capillary refill takes less than 2 seconds.  Neurological:     Mental Status: He is alert and oriented to person, place, and time.      ED Treatments / Results  Labs (all labs ordered are listed, but only abnormal results are displayed) Labs Reviewed  COMPREHENSIVE METABOLIC PANEL - Abnormal; Notable for the following components:      Result Value   Total Protein 8.3 (*)    Total Bilirubin 6.1 (*)    All other components within normal limits  CBC WITH DIFFERENTIAL/PLATELET - Abnormal; Notable for the following components:   WBC 11.5 (*)    RBC 1.96 (*)    Hemoglobin 8.7 (*)    HCT 23.7 (*)    MCV 120.9 (*)    MCH 44.4 (*)    MCHC 36.7 (*)    RDW 23.1 (*)    nRBC 11.5 (*)    Lymphs Abs 5.2 (*)    Monocytes Absolute 1.6 (*)    All other components within normal limits  RETICULOCYTES - Abnormal; Notable for the  following components:   Retic Ct Pct 15.1 (*)    RBC. 1.96 (*)    Retic Count, Absolute 295.8 (*)    Immature Retic Fract 42.7 (*)    All other components within normal limits  CBC WITH DIFFERENTIAL/PLATELET    EKG None  Radiology No results found.  Procedures Procedures (including critical care time)  Medications Ordered in ED Medications  0.45 % sodium chloride infusion ( Intravenous New Bag/Given 05/20/18 0702)  HYDROmorphone (DILAUDID) injection 2 mg (2 mg Intravenous  Not Given 05/20/18 0855)    Or  HYDROmorphone (DILAUDID) injection 2 mg ( Subcutaneous See Alternative 05/20/18 0855)  HYDROmorphone (DILAUDID) injection 2 mg (2 mg Intravenous Given 05/20/18 0902)    Or  HYDROmorphone (DILAUDID) injection 2 mg ( Subcutaneous See Alternative 05/20/18 0902)  diphenhydrAMINE (BENADRYL) capsule 25-50 mg (has no administration in time range)  sodium chloride flush (NS) 0.9 % injection 3 mL (3 mLs Intravenous Given 05/20/18 0757)  HYDROmorphone (DILAUDID) injection 2 mg (2 mg Intravenous Given 05/20/18 0654)    Or  HYDROmorphone (DILAUDID) injection 2 mg ( Subcutaneous See Alternative 05/20/18 0654)  HYDROmorphone (DILAUDID) injection 2 mg (2 mg Intravenous Given 05/20/18 0755)    Or  HYDROmorphone (DILAUDID) injection 2 mg ( Subcutaneous See Alternative 05/20/18 0755)  diphenhydrAMINE (BENADRYL) injection 25 mg (25 mg Intravenous Given 05/20/18 0654)  hydrOXYzine (ATARAX/VISTARIL) tablet 25 mg (25 mg Oral Given 05/20/18 0901)     Initial Impression / Assessment and Plan / ED Course  I have reviewed the triage vital signs and the nursing notes.  Pertinent labs & imaging results that were available during my care of the patient were reviewed by me and considered in my medical decision making (see chart for details).     Presenting for evaluation of sickle cell pain crisis.  Physical exam shows patient is afebrile not tachycardic.  No signs of acute chest.  Patient does appear to have  pain with palpation and movement of his lower extremities bilaterally.  Additionally, mild generalized abdominal pain, worse in epigastric area.  However, no nausea, vomiting, diarrhea, or fevers.  As such, low suspicion for intra-abdominal infection.  Will reassess after pain control and lab work.  Will start sickle cell protocol.  Lab work shows hemoglobin at baseline.  Mild elevation in white count at 11.5, although this is close to patient's baseline.  Bili elevated at 6.1, patient has a chronically elevated bili and is status post cholecystectomy.  On reevaluation, patient reports abdominal pain has resolved.  He is tolerating p.o. with difficulty.  As such, doubt intra-abdominal infection, perforation, obstruction, or surgical abdomen.  I do not like CT scan is necessary at this time.  Patient reports pain continues to improve, is had 2 out of 3 doses of pain medication.  Will give third dose and reassess.  On reassessment after 3rd dose, patient reports pain is improving.  He feels he can go home and manage his pain with his home medications.  Encourage follow-up with PCP.  At this time, patient appears safe for discharge.  Return precautions given.  Patient states he understands and agrees to plan.  Final Clinical Impressions(s) / ED Diagnoses   Final diagnoses:  Sickle cell pain crisis [facility])    ED Discharge Orders    None       Alveria Apley, PA-C 05/20/18 0931    Devoria Albe, MD 05/21/18 [phone]

## 2018-05-20 NOTE — Discharge Instructions (Addendum)
Continue taking home medications as prescribed. Make sure you are staying well-hydrated water. Follow-up with your primary care doctor as needed for further evaluation of your symptoms. Return to the emergency room with any new, worsening, concerning symptoms.

## 2018-05-29 ENCOUNTER — Emergency Department (HOSPITAL_COMMUNITY)
Admission: EM | Admit: 2018-05-29 | Discharge: 2018-05-29 | Disposition: A | Discharge status: Home / Self Care | Payer: Medicaid - Out of State | Attending: Emergency Medicine | Admitting: Emergency Medicine

## 2018-05-29 ENCOUNTER — Other Ambulatory Visit: Payer: Self-pay

## 2018-05-29 DIAGNOSIS — D5700 Hb-SS disease with crisis, unspecified: Secondary | ICD-10-CM | POA: Diagnosis not present

## 2018-05-29 DIAGNOSIS — B20 Human immunodeficiency virus [HIV] disease: Secondary | ICD-10-CM | POA: Diagnosis not present

## 2018-05-29 DIAGNOSIS — Z79899 Other long term (current) drug therapy: Secondary | ICD-10-CM | POA: Insufficient documentation

## 2018-05-29 DIAGNOSIS — M25551 Pain in right hip: Secondary | ICD-10-CM | POA: Diagnosis present

## 2018-05-29 LAB — CBC WITH DIFFERENTIAL/PLATELET
Abs Immature Granulocytes: 0.18 10*3/uL — ABNORMAL HIGH (ref 0.00–0.07)
Basophils Absolute: 0.1 10*3/uL (ref 0.0–0.1)
Basophils Relative: 0 %
Eosinophils Absolute: 0.2 10*3/uL (ref 0.0–0.5)
Eosinophils Relative: 2 %
HCT: 33.4 % — ABNORMAL LOW (ref 39.0–52.0)
Hemoglobin: 12.1 g/dL — ABNORMAL LOW (ref 13.0–17.0)
Immature Granulocytes: 1 %
Lymphocytes Relative: 28 %
Lymphs Abs: 3.9 10*3/uL (ref 0.7–4.0)
MCH: 40.1 pg — ABNORMAL HIGH (ref 26.0–34.0)
MCHC: 36.2 g/dL — ABNORMAL HIGH (ref 30.0–36.0)
MCV: 110.6 fL — ABNORMAL HIGH (ref 80.0–100.0)
Monocytes Absolute: 2.3 10*3/uL — ABNORMAL HIGH (ref 0.1–1.0)
Monocytes Relative: 17 %
Neutro Abs: 7.1 10*3/uL (ref 1.7–7.7)
Neutrophils Relative %: 52 %
Platelets: 390 10*3/uL (ref 150–400)
RBC: 3.02 MIL/uL — ABNORMAL LOW (ref 4.22–5.81)
RDW: 23 % — ABNORMAL HIGH (ref 11.5–15.5)
WBC: 13.7 10*3/uL — ABNORMAL HIGH (ref 4.0–10.5)
nRBC: 11 % — ABNORMAL HIGH (ref 0.0–0.2)

## 2018-05-29 LAB — COMPREHENSIVE METABOLIC PANEL
ALT: 27 U/L (ref 0–44)
AST: 43 U/L — ABNORMAL HIGH (ref 15–41)
Albumin: 4.2 g/dL (ref 3.5–5.0)
Alkaline Phosphatase: 52 U/L (ref 38–126)
Anion gap: 11 (ref 5–15)
BUN: 14 mg/dL (ref 6–20)
CO2: 23 mmol/L (ref 22–32)
Calcium: 9.6 mg/dL (ref 8.9–10.3)
Chloride: 107 mmol/L (ref 98–111)
Creatinine, Ser: 0.83 mg/dL (ref 0.61–1.24)
GFR calc Af Amer: >60 mL/min (ref >60)
GFR calc non Af Amer: >60 mL/min (ref >60)
Glucose, Bld: 107 mg/dL — ABNORMAL HIGH (ref 70–99)
Potassium: 4.1 mmol/L (ref 3.5–5.1)
Sodium: 141 mmol/L (ref 135–145)
Total Bilirubin: 8.5 mg/dL — ABNORMAL HIGH (ref 0.3–1.2)
Total Protein: 8.9 g/dL — ABNORMAL HIGH (ref 6.5–8.1)

## 2018-05-29 LAB — RETICULOCYTES
Immature Retic Fract: 44.1 % — ABNORMAL HIGH (ref 2.3–15.9)
RBC.: 3.02 MIL/uL — ABNORMAL LOW (ref 4.22–5.81)
Retic Count, Absolute: 423 10*3/uL — ABNORMAL HIGH (ref 19.0–186.0)
Retic Ct Pct: 16.5 % — ABNORMAL HIGH (ref 0.4–3.1)

## 2018-05-29 MED ORDER — DIPHENHYDRAMINE HCL 50 MG/ML IJ SOLN
25.0000 mg | Freq: Once | INTRAMUSCULAR | Status: AC
  Administered 2018-05-29: 25 mg via INTRAVENOUS
  Filled 2018-05-29: qty 1

## 2018-05-29 MED ORDER — HYDROMORPHONE HCL 1 MG/ML IJ SOLN: 2.0000 mg | INTRAMUSCULAR | Status: AC

## 2018-05-29 MED ORDER — DIPHENHYDRAMINE HCL 50 MG/ML IJ SOLN
25.0000 mg | INTRAMUSCULAR | Status: DC | PRN
  Administered 2018-05-29: 25 mg via INTRAVENOUS
  Filled 2018-05-29: qty 1

## 2018-05-29 MED ORDER — HYDROMORPHONE HCL 1 MG/ML IJ SOLN
2.0000 mg | INTRAMUSCULAR | Status: AC
  Administered 2018-05-29: 2 mg via INTRAVENOUS
  Filled 2018-05-29: qty 2

## 2018-05-29 MED ORDER — HYDROMORPHONE HCL 1 MG/ML IJ SOLN: 2.0000 mg | INTRAMUSCULAR | Status: DC

## 2018-05-29 MED ORDER — SODIUM CHLORIDE 0.9% FLUSH
3.0000 mL | Freq: Once | INTRAVENOUS | Status: AC
  Administered 2018-05-29: 3 mL via INTRAVENOUS

## 2018-05-29 MED ORDER — HYDROMORPHONE HCL 1 MG/ML IJ SOLN
1.0000 mg | Freq: Once | INTRAMUSCULAR | Status: AC
  Administered 2018-05-29: 1 mg via INTRAVENOUS
  Filled 2018-05-29: qty 1

## 2018-05-29 MED ORDER — SODIUM CHLORIDE 0.45 % IV SOLN
INTRAVENOUS | Status: DC
  Administered 2018-05-29: 04:00:00 via INTRAVENOUS

## 2018-05-29 NOTE — ED Triage Notes (Signed)
Patient c/o hip and leg pain that started last night.

## 2018-05-29 NOTE — Discharge Instructions (Signed)
Return if pain is not being adequately controlled at home. ?

## 2018-05-29 NOTE — ED Notes (Signed)
Patient awakened to reassess pain, states 8/10. Unable to keep eyes open during assessment/re-check of VS. When closing door, patient again wakes up and requests [employer]. Patient has already fallen back asleep. Will continue to reassess.

## 2018-05-29 NOTE — ED Provider Notes (Signed)
[facility] EMERGENCY DEPARTMENT Provider Note   CSN: [id number] Arrival date & time: 05/29/18  0222     History   Chief Complaint Chief Complaint  Patient presents with  . Leg Pain  . Hip Pain    HPI Martin [name] is a 28 y.o. male.  The history is provided by the patient.  He has history of sickle cell disease and HIV infection and comes in with pain in both hips and both legs since 9 PM.  Pain is typical of his sickle cell crisis.  He rates pain a 10/10.  He took a dose of oxycodone-acetaminophen at home with no relief of pain.  He thinks crisis came on because he was out in the cold.  He denies any fever, rhinorrhea, cough, chest pain, dyspnea, vomiting, diarrhea.  Past Medical History:  Diagnosis Date  . Anxiety   . HIV (human immunodeficiency virus infection) (HCC)   . Sickle cell crisis (HCC)   . Sickle cell disease [facility])     Patient Active Problem List   Diagnosis Date Noted  . Heart murmur 02/03/2017  . Sickle cell disease with crisis (HCC) 02/02/2017  . 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 Sickle cell crisis (HCC) 02/02/2017  . 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HCC) 12/20/2016  . Vitamin D deficiency 08/14/2016  . High risk medication use 09/27/2014  . Generalized anxiety disorder 05/19/2014  . GERD (gastroesophageal reflux disease) 05/19/2014  . HIV (human immunodeficiency virus infection) (HCC) 05/23/2012    No past surgical history on file.      Home Medications    Prior to Admission medications   Medication Sig Start Date End Date Taking? Authorizing Provider  abacavir-dolutegravir-lamiVUDine (TRIUMEQ) 600-50-300 MG tablet Take 1 tablet by mouth daily.    [provider]  ALPRAZolam Prudy Feeler) 1 MG tablet Take 1 mg by mouth 2 (two) times daily as needed for anxiety.  12/03/16   [provider]  diphenhydrAMINE (BENADRYL) 25 MG tablet Take 25 mg by mouth every 6 (six) hours as needed for allergies.    [provider]  hydroxyurea (HYDREA) 500 MG capsule Take 2,000 mg by mouth at bedtime. 12/28/15   [provider]  ibuprofen (ADVIL,MOTRIN) 200 MG tablet Take 400 mg by mouth every 6 (six) hours as needed for moderate pain.     [provider]  Multiple Vitamin (MULTIVITAMIN WITH MINERALS) TABS tablet Take 1 tablet by mouth daily.    [provider]  oxyCODONE-acetaminophen (PERCOCET) 10-325 MG tablet Take 1 tablet by mouth every 6 (six) hours as needed for pain.  01/05/17   [provider]    Family History No family history on file.  Social History Social History   Tobacco Use  . Smoking status: Never Smoker  . Smokeless tobacco: Never Used  Substance Use Topics  . Alcohol use: No  . Drug use: No     Allergies   Ketorolac   Review of Systems Review of Systems  All other systems reviewed and are negative.    Physical Exam Updated Vital Signs BP 120/86 (BP Location: Left Arm)   Pulse (!) 101   Temp 98.9 F (37.2 C) (Oral)   Resp 17   SpO2 94%   Physical Exam Vitals signs and nursing note reviewed.    28 year old male, appears to be in pain, but is in no acute distress. Vital signs are significant for borderline tachycardia. Oxygen saturation is 94%, which is normal. Head is normocephalic  and atraumatic. PERRLA, EOMI. Oropharynx is clear. Neck is nontender and supple without adenopathy or JVD. Back is nontender and there is no CVA tenderness. Lungs are clear without rales, wheezes, or rhonchi. Chest is nontender. Heart has regular rate and rhythm without murmur. Abdomen is soft, flat, nontender without masses or hepatosplenomegaly and peristalsis is normoactive. Extremities have no cyanosis or edema, full range of motion is present. Skin is warm and dry without rash. Neurologic: Mental status is normal, cranial nerves are intact, there are no motor or sensory deficits.  ED Treatments / Results  Labs (all labs ordered are  listed, but only abnormal results are displayed) Labs Reviewed  COMPREHENSIVE METABOLIC PANEL - Abnormal; Notable for the following components:      Result Value   Glucose, Bld 107 (*)    Total Protein 8.9 (*)    AST 43 (*)    Total Bilirubin 8.5 (*)    All other components within normal limits  CBC WITH DIFFERENTIAL/PLATELET - Abnormal; Notable for the following components:   WBC 13.7 (*)    nRBC 11.0 (*)    All other components within normal limits  RETICULOCYTES   Procedures Procedures  Medications Ordered in ED Medications  sodium chloride flush (NS) 0.9 % injection 3 mL (has no administration in time range)     Initial Impression / Assessment and Plan / ED Course  I have reviewed the triage vital signs and the nursing notes.  Pertinent lab results that were available during my care of the patient were reviewed by me and considered in my medical decision making (see chart for details).  Sickle cell crisis.  Old records are reviewed, and he has multiple prior ED visits and hospitalizations for sickle cell crises.  He is started on sickle cell crisis protocol.  He had adequate pain relief with above-noted treatment and is discharged with instructions to follow-up with his primary care provider.  Return precautions discussed.  Final Clinical Impressions(s) / ED Diagnoses   Final diagnoses:  Sickle cell anemia with crisis [facility])    ED Discharge Orders    None       Dione Booze, MD 05/29/18 [phone]

## 2018-06-27 ENCOUNTER — Inpatient Hospital Stay (HOSPITAL_COMMUNITY)
Admission: EM | Admit: 2018-06-27 | Discharge: 2018-07-03 | DRG: 812 | Disposition: A | Discharge status: Home / Self Care | Payer: Medicaid - Out of State | Attending: Internal Medicine | Admitting: Internal Medicine

## 2018-06-27 ENCOUNTER — Encounter (HOSPITAL_COMMUNITY): Payer: Self-pay

## 2018-06-27 ENCOUNTER — Other Ambulatory Visit: Payer: Self-pay

## 2018-06-27 DIAGNOSIS — D5700 Hb-SS disease with crisis, unspecified: Principal | ICD-10-CM

## 2018-06-27 DIAGNOSIS — D72829 Elevated white blood cell count, unspecified: Secondary | ICD-10-CM | POA: Diagnosis present

## 2018-06-27 DIAGNOSIS — K219 Gastro-esophageal reflux disease without esophagitis: Secondary | ICD-10-CM | POA: Diagnosis present

## 2018-06-27 DIAGNOSIS — D638 Anemia in other chronic diseases classified elsewhere: Secondary | ICD-10-CM | POA: Diagnosis present

## 2018-06-27 DIAGNOSIS — Z21 Asymptomatic human immunodeficiency virus [HIV] infection status: Secondary | ICD-10-CM | POA: Diagnosis present

## 2018-06-27 DIAGNOSIS — G894 Chronic pain syndrome: Secondary | ICD-10-CM | POA: Diagnosis present

## 2018-06-27 DIAGNOSIS — B20 Human immunodeficiency virus [HIV] disease: Secondary | ICD-10-CM | POA: Diagnosis present

## 2018-06-27 DIAGNOSIS — Z832 Family history of diseases of the blood and blood-forming organs and certain disorders involving the immune mechanism: Secondary | ICD-10-CM | POA: Diagnosis not present

## 2018-06-27 DIAGNOSIS — Z79899 Other long term (current) drug therapy: Secondary | ICD-10-CM

## 2018-06-27 LAB — CBC WITH DIFFERENTIAL/PLATELET
Abs Immature Granulocytes: 0.06 10*3/uL (ref 0.00–0.07)
Basophils Absolute: 0 10*3/uL (ref 0.0–0.1)
Basophils Relative: 0 %
Eosinophils Absolute: 0.2 10*3/uL (ref 0.0–0.5)
Eosinophils Relative: 1 %
HCT: 26.6 % — ABNORMAL LOW (ref 39.0–52.0)
Hemoglobin: 9 g/dL — ABNORMAL LOW (ref 13.0–17.0)
Immature Granulocytes: 0 %
Lymphocytes Relative: 42 %
Lymphs Abs: 6.8 10*3/uL — ABNORMAL HIGH (ref 0.7–4.0)
MCH: 40.5 pg — ABNORMAL HIGH (ref 26.0–34.0)
MCHC: 33.8 g/dL (ref 30.0–36.0)
MCV: 119.8 fL — ABNORMAL HIGH (ref 80.0–100.0)
Monocytes Absolute: 1.8 10*3/uL — ABNORMAL HIGH (ref 0.1–1.0)
Monocytes Relative: 12 %
Neutro Abs: 7 10*3/uL (ref 1.7–7.7)
Neutrophils Relative %: 45 %
Platelets: 322 10*3/uL (ref 150–400)
RBC: 2.22 MIL/uL — ABNORMAL LOW (ref 4.22–5.81)
RDW: 21.5 % — ABNORMAL HIGH (ref 11.5–15.5)
WBC: 15.8 10*3/uL — ABNORMAL HIGH (ref 4.0–10.5)
nRBC: 13 % — ABNORMAL HIGH (ref 0.0–0.2)

## 2018-06-27 LAB — COMPREHENSIVE METABOLIC PANEL
ALT: 25 U/L (ref 0–44)
AST: 40 U/L (ref 15–41)
Albumin: 4.5 g/dL (ref 3.5–5.0)
Alkaline Phosphatase: 56 U/L (ref 38–126)
Anion gap: 8 (ref 5–15)
BUN: 10 mg/dL (ref 6–20)
CO2: 24 mmol/L (ref 22–32)
Calcium: 8.9 mg/dL (ref 8.9–10.3)
Chloride: 108 mmol/L (ref 98–111)
Creatinine, Ser: 0.63 mg/dL (ref 0.61–1.24)
GFR calc Af Amer: >60 mL/min (ref >60)
GFR calc non Af Amer: >60 mL/min (ref >60)
Glucose, Bld: 99 mg/dL (ref 70–99)
Potassium: 3.8 mmol/L (ref 3.5–5.1)
Sodium: 140 mmol/L (ref 135–145)
Total Bilirubin: 7.7 mg/dL — ABNORMAL HIGH (ref 0.3–1.2)
Total Protein: 9.2 g/dL — ABNORMAL HIGH (ref 6.5–8.1)

## 2018-06-27 LAB — RETICULOCYTES
Immature Retic Fract: 33.7 % — ABNORMAL HIGH (ref 2.3–15.9)
RBC.: 2.22 MIL/uL — ABNORMAL LOW (ref 4.22–5.81)
Retic Count, Absolute: 189.8 10*3/uL — ABNORMAL HIGH (ref 19.0–186.0)
Retic Ct Pct: 8.6 % — ABNORMAL HIGH (ref 0.4–3.1)

## 2018-06-27 MED ORDER — HYDROMORPHONE HCL 2 MG/ML IJ SOLN
2.0000 mg | Freq: Once | INTRAMUSCULAR | Status: AC
  Administered 2018-06-27: 2 mg via SUBCUTANEOUS
  Filled 2018-06-27: qty 1

## 2018-06-27 MED ORDER — DIPHENHYDRAMINE HCL 50 MG/ML IJ SOLN
12.5000 mg | Freq: Once | INTRAMUSCULAR | Status: AC
  Administered 2018-06-27: 12.5 mg via INTRAVENOUS
  Filled 2018-06-27: qty 1

## 2018-06-27 MED ORDER — SODIUM CHLORIDE 0.9% FLUSH: 3.0000 mL | Freq: Once | INTRAVENOUS | Status: DC

## 2018-06-27 MED ORDER — SODIUM CHLORIDE 0.9 % IV BOLUS: 500.0000 mL | Freq: Once | INTRAVENOUS | Status: DC

## 2018-06-27 NOTE — ED Triage Notes (Signed)
Patient c/o sickle cell pain of the lower back, bilateral hips, and bilateral shoulders that started at 0500 today. Patient denies any chest pain or SOB.

## 2018-06-27 NOTE — ED Notes (Signed)
O2 2L/min via NV placed for sats 92% on room air. Sats increased to 94%.

## 2018-06-27 NOTE — H&P (Signed)
Martin [name] KPT:[id number] DOB: 13-Oct-1990 DOA: 06/27/2018     PCP: Patient, No Pcp Per   Outpatient Specialists: hematology  [facility]   Patient arrived to ER on 06/27/18 at 1814  Patient coming from: home Lives alone,       Chief Complaint:  Chief Complaint  Patient presents with  . Sickle Cell Pain Crisis    HPI: Martin [name] is a 28 y.o. male with medical history significant of sickle cell and HIV     Presented with generalized pain in the lower back, bilateral  Hips, shoulders  Started today started  at 5 AM. He though it was due to air-conditioner running.  Pain to his legs and hips worsened with movement he took his home dose of oxycodone but not seem to help.  No chest pain or shortness of breath no headache no visual changes otherwise no fevers or chills  Pt originally from [place] but has been visiting [place] Last sickle cell crisis was in end of JAnuary  Regarding pertinent Chronic problems: History of HIV on Triumeq   While in ER: Dilaudid x3 no improvement   The following Work up has been ordered so far:  Orders Placed This Encounter  Procedures  . Comprehensive metabolic panel  . CBC with Differential  . Reticulocytes  . Monitor O2 SATs  . Document Actual / Estimated Weight  . If O2 Sat <94% administer O2 at 2 liters/minute via nasal cannula  . Vital signs with O2 sat, q2hours  . Cardiac monitoring  . Weigh patient in Kg  . Recheck patient 30 minutes after analgesic administration and notify provider if pain not controlled.  . Consult to hospitalist    Following Medications were ordered in ER: Medications  sodium chloride flush (NS) 0.9 % injection 3 mL (has no administration in time range)  HYDROmorphone (DILAUDID) injection 2 mg (2 mg Subcutaneous Given 06/27/18 2012)  HYDROmorphone (DILAUDID) injection 2 mg (2 mg Subcutaneous Given 06/27/18 2127)  HYDROmorphone (DILAUDID) injection 2 mg (2 mg Subcutaneous Given 06/27/18 2228)    diphenhydrAMINE (BENADRYL) injection 12.5 mg (12.5 mg Intravenous Given 06/27/18 2339)    Significant initial  Findings: Abnormal Labs Reviewed  COMPREHENSIVE METABOLIC PANEL - Abnormal; Notable for the following components:      Result Value   Total Protein 9.2 (*)    Total Bilirubin 7.7 (*)    All other components within normal limits  CBC WITH DIFFERENTIAL/PLATELET - Abnormal; Notable for the following components:   WBC 15.8 (*)    RBC 2.22 (*)    Hemoglobin 9.0 (*)    HCT 26.6 (*)    MCV 119.8 (*)    MCH 40.5 (*)    RDW 21.5 (*)    nRBC 13.0 (*)    Lymphs Abs 6.8 (*)    Monocytes Absolute 1.8 (*)    All other components within normal limits  RETICULOCYTES - Abnormal; Notable for the following components:   Retic Ct Pct 8.6 (*)    RBC. 2.22 (*)    Retic Count, Absolute 189.8 (*)    Immature Retic Fract 33.7 (*)    All other components within normal limits     Lactic Acid, Venous No results found for: LATICACIDVEN  Na 140 K 3.8  Cr   stable,    Lab Results  Component Value Date   CREATININE 0.63 06/27/2018   CREATININE 0.83 05/29/2018   CREATININE 0.70 05/20/2018      WBC  15.8  HG/HCT    Down  from baseline see below    Component Value Date/Time   HGB 9.0 (L) 06/27/2018 1902   HCT 26.6 (L) 06/27/2018 1902      UA not ordered     ECG: not obtained     ED Triage Vitals  Enc Vitals Group     BP 06/27/18 1827 117/76     Pulse Rate 06/27/18 1827 96     Resp 06/27/18 1827 16     Temp 06/27/18 1827 98.5 F (36.9 C)     Temp Source 06/27/18 1827 Oral     SpO2 06/27/18 1827 92 %     Weight 06/27/18 1829 150 lb (68 kg)     Height 06/27/18 1829 6\' 3"  (1.905 m)     Head Circumference --      Peak Flow --      Pain Score 06/27/18 1828 10     Pain Loc --      Pain Edu? --      Excl. in GC? --   TMAX(24)@       Latest  Blood pressure (!) 109/59, pulse 89, temperature 98.5 F (36.9 C), temperature source Oral, resp. rate (!) 29, height 6\' 3"   (1.905 m), weight 68 kg, SpO2 93 %.    Hospitalist was called for admission for sickle cell pain crisis   Review of Systems:    Pertinent positives include: joint pain   Constitutional:  No weight loss, night sweats, Fevers, chills, fatigue, weight loss  HEENT:  No headaches, Difficulty swallowing,Tooth/dental problems,Sore throat,  No sneezing, itching, ear ache, nasal congestion, post nasal drip,  Cardio-vascular:  No chest pain, Orthopnea, PND, anasarca, dizziness, palpitations.no Bilateral lower extremity swelling  GI:  No heartburn, indigestion, abdominal pain, nausea, vomiting, diarrhea, change in bowel habits, loss of appetite, melena, blood in stool, hematemesis Resp:  no shortness of breath at rest. No dyspnea on exertion, No excess mucus, no productive cough, No non-productive cough, No coughing up of blood.No change in color of mucus.No wheezing. Skin:  no rash or lesions. No jaundice GU:  no dysuria, change in color of urine, no urgency or frequency. No straining to urinate.  No flank pain.  Musculoskeletal:   . No decreased range of motion. No back pain.  Psych:  No change in mood or affect. No depression or anxiety. No memory loss.  Neuro: no localizing neurological complaints, no tingling, no weakness, no double vision, no gait abnormality, no slurred speech, no confusion  All systems reviewed and apart from HOPI all are negative  Past Medical History:   Past Medical History:  Diagnosis Date  . Anxiety   . HIV (human immunodeficiency virus infection) (HCC)   . Sickle cell crisis (HCC)   . 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HCC)       History reviewed. No pertinent surgical history.  Social History:  Ambulatory independently       reports that he has never smoked. He has never used smokeless tobacco. He reports that he does not drink alcohol or use drugs.   Family History:   Family History  Problem Relation Age of Onset  . Sickle cell trait Mother   . Sickle  cell trait Father     Allergies: Allergies  Allergen Reactions  . Ketorolac Swelling    Patient reports facial edema and left arm edema after administration.     Prior to Admission medications   Medication Sig Start Date End Date Taking? Authorizing Provider  abacavir-dolutegravir-lamiVUDine (TRIUMEQ) 600-50-300 MG tablet Take 1 tablet by mouth daily.   Yes [provider]  ALPRAZolam Prudy Feeler) 1 MG tablet Take 1 mg by mouth 2 (two) times daily as needed for anxiety.  12/03/16  Yes [provider]  diphenhydrAMINE (BENADRYL) 25 MG tablet Take 25 mg by mouth every 6 (six) hours as needed for allergies.   Yes [provider]  hydroxyurea (HYDREA) 500 MG capsule Take 2,000 mg by mouth at bedtime. 12/28/15  Yes [provider]  ibuprofen (ADVIL,MOTRIN) 200 MG tablet Take 400 mg by mouth every 6 (six) hours as needed for moderate pain.    Yes [provider]  Multiple Vitamin (MULTIVITAMIN WITH MINERALS) TABS tablet Take 1 tablet by mouth daily.   Yes [provider]  oxyCODONE-acetaminophen (PERCOCET) 10-325 MG tablet Take 1 tablet by mouth every 6 (six) hours as needed for pain.  01/05/17  Yes [provider]   Physical Exam: Blood pressure (!) 109/59, pulse 89, temperature 98.5 F (36.9 C), temperature source Oral, resp. rate (!) 29, height  (1.905 m), weight 68 kg, SpO2 93 %. 1. General:  in No Acute distress     Chronically ill  -appearing 2. Psychological: Alert and   Oriented 3. Head/ENT:    Dry Mucous Membranes                          Head Non traumatic, neck supple                          Normal   Dentition 4. SKIN:   decreased Skin turgor,  Skin clean Dry and intact no rash 5. Heart: Regular rate and rhythm no  Murmur, no Rub or gallop 6. Lungs:  Clear to auscultation bilaterally, no wheezes or crackles   7. Abdomen: Soft, non-tender, Non distended bowel sounds present 8. Lower extremities: no clubbing, cyanosis,  no  edema 9. Neurologically Grossly intact, moving all 4 extremities equally   10. MSK: Normal range of motion   LABS:     Recent Labs  Lab 06/27/18 1902  WBC 15.8*  NEUTROABS 7.0  HGB 9.0*  HCT 26.6*  MCV 119.8*  PLT 322   Basic Metabolic Panel: Recent Labs  Lab 06/27/18 1902  NA 140  K 3.8  CL 108  CO2 24  GLUCOSE 99  BUN 10  CREATININE 0.63  CALCIUM 8.9      Recent Labs  Lab 06/27/18 1902  AST 40  ALT 25  ALKPHOS 56  BILITOT 7.7*  PROT 9.2*  ALBUMIN 4.5   No results for input(s): LIPASE, AMYLASE in the last 168 hours. No results for input(s): AMMONIA in the last 168 hours.    HbA1C: No results for input(s): HGBA1C in the last 72 hours. CBG: No results for input(s): GLUCAP in the last 168 hours.    Urine analysis:    Component Value Date/Time   COLORURINE YELLOW 02/02/2017 1024   APPEARANCEUR CLEAR 02/02/2017 1024   LABSPEC 1.012 02/02/2017 1024   PHURINE 5.0 02/02/2017 1024   GLUCOSEU NEGATIVE 02/02/2017 1024   HGBUR NEGATIVE 02/02/2017 1024   BILIRUBINUR NEGATIVE 02/02/2017 1024   KETONESUR NEGATIVE 02/02/2017 1024   PROTEINUR NEGATIVE 02/02/2017 1024   UROBILINOGEN 4.0 (H) 05/21/2009 1944   NITRITE NEGATIVE 02/02/2017 1024   LEUKOCYTESUR NEGATIVE 02/02/2017 1024   Cultures:    Component Value Date/Time   SDES URINE, CLEAN  CATCH 05/21/2009 1944   SPECREQUEST NONE 05/21/2009 1944   CULT NO GROWTH 05/21/2009 1944   REPTSTATUS 05/23/2009 FINAL 05/21/2009 1944     Radiological Exams on Admission: No results found.  Chart has been reviewed    Assessment/Plan   28 y.o. male with medical history significant of sickle cell and HIV   Admitted for sickle cell pain crisis  Present on Admission: . Sickle cell crisis (HCC) -  will admit per sickle cell protocol,    control pain,    hydrate with IVF D5 .45% Saline @ 100 mls/hour,    Weight based Dilaudid PCA for opioid tolerant patients.    continue hydroxyurea and folic acid    Transfuse as needed if Hg drops significantly below baseline.    No evidence of acute chest at this time   Sickle cell team to take over management in AM    . GERD (gastroesophageal reflux disease) continue home meds . HIV (human immunodeficiency virus infection) (HCC) - have been followed by ID in [place] states had his CD4 count checked there 4 m ago and it was good he has been compliant with his medications Will continue    Other plan as per orders.  DVT prophylaxis:   Lovenox     Code Status:  FULL CODE as per patient     I had personally discussed CODE STATUS with patient    Family Communication:   Family not at  Bedside    Disposition Plan:   To home once workup is complete and patient is stable    Consults called: none   Admission status:    inpatient     Expect 2 midnight stay secondary to severity of patient's current illness including sickle cell crisis with pain uncontrolled by IV narcotics in the emergency department   and extensive comorbidities including:   sickle cell disease  .    That are currently affecting medical management.   I expect  patient to be hospitalized for 2 midnights requiring inpatient medical care.  Patient is at high risk for adverse outcome (such as loss of life or disability) if not treated.  Indication for inpatient stay as follows:  severe pain requiring acute inpatient management,  Need for  IV pain medications    Level of care      medical floor           Martin [name] 06/27/2018, 12:20 AM    [facility]     after 2 AM please page floor coverage PA If 7AM-7PM, please contact the day team taking care of the patient using [email or web address]

## 2018-06-27 NOTE — ED Provider Notes (Signed)
[facility]-EMERGENCY DEPT Provider Note   CSN: [id number] Arrival date & time: 06/27/18  1814    History   Chief Complaint Chief Complaint  Patient presents with  . Sickle Cell Pain Crisis    HPI Martin [name] is a 28 y.o. male with history of HIV and sickle cell disease presenting today for pain crisis.  Patient reports that his crisis started this morning at 5 AM.  He reports that he slept over his friend's house and there air conditioner was on all night and caused him to become very cold.  Patient states that the cold is normally the trigger for his sickle cell pain.  Patient describes a severe throbbing pain to his bilateral hips and legs constant worsened with movement without alleviating factors.  Patient states that he took his home oxycodone without relief of his symptoms.  Patient is presenting today for pain relief.  Patient denies abnormal features of his pain crisis today.  He denies recent fever or illness.  He denies chest pain, shortness of breath, headache, vision changes, nausea/vomiting or abdominal pain.    HPI  Past Medical History:  Diagnosis Date  . Anxiety   . HIV (human immunodeficiency virus infection) (HCC)   . Sickle cell crisis (HCC)   . Sickle cell disease [facility])     Patient Active Problem List   Diagnosis Date Noted  . Heart murmur 02/03/2017  . Sickle cell disease with crisis (HCC) 02/02/2017  . 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 Sickle cell crisis (HCC) 02/02/2017  . 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HCC) 12/20/2016  . Vitamin D deficiency 08/14/2016  . High risk medication use 09/27/2014  . Generalized anxiety disorder 05/19/2014  . GERD (gastroesophageal reflux disease) 05/19/2014  . HIV (human immunodeficiency virus infection) (HCC) 05/23/2012    History reviewed. No pertinent surgical history.      Home Medications    Prior to Admission medications   Medication Sig Start Date End Date Taking? Authorizing  Provider  abacavir-dolutegravir-lamiVUDine (TRIUMEQ) 600-50-300 MG tablet Take 1 tablet by mouth daily.   Yes [provider]  ALPRAZolam Prudy Feeler) 1 MG tablet Take 1 mg by mouth 2 (two) times daily as needed for anxiety.  12/03/16  Yes [provider]  diphenhydrAMINE (BENADRYL) 25 MG tablet Take 25 mg by mouth every 6 (six) hours as needed for allergies.   Yes [provider]  hydroxyurea (HYDREA) 500 MG capsule Take 2,000 mg by mouth at bedtime. 12/28/15  Yes [provider]  ibuprofen (ADVIL,MOTRIN) 200 MG tablet Take 400 mg by mouth every 6 (six) hours as needed for moderate pain.    Yes [provider]  Multiple Vitamin (MULTIVITAMIN WITH MINERALS) TABS tablet Take 1 tablet by mouth daily.   Yes [provider]  oxyCODONE-acetaminophen (PERCOCET) 10-325 MG tablet Take 1 tablet by mouth every 6 (six) hours as needed for pain.  01/05/17  Yes [provider]    Family History Family History  Problem Relation Age of Onset  . Sickle cell trait Mother   . Sickle cell trait Father     Social History Social History   Tobacco Use  . Smoking status: Never Smoker  . Smokeless tobacco: Never Used  Substance Use Topics  . Alcohol use: No  . Drug use: No     Allergies   Ketorolac   Review of Systems Review of Systems  Constitutional: Negative.  Negative for chills and fever.  Eyes: Negative.  Negative for visual disturbance.  Respiratory: Negative.  Negative for cough and shortness of breath.   Cardiovascular: Negative.  Negative for chest pain.  Gastrointestinal: Negative.  Negative for abdominal pain, nausea and vomiting.  Musculoskeletal: Positive for arthralgias and myalgias. Negative for neck pain.  Neurological: Negative.  Negative for syncope, weakness, numbness and headaches.  All other systems reviewed and are negative.  Physical Exam Updated Vital Signs BP (!) 109/59   Pulse 89   Temp 98.5 F (36.9 C) (Oral)    Resp (!) 29   Ht  (1.905 m)   Wt 68 kg   SpO2 93%   BMI 18.75 kg/m   Physical Exam Constitutional:      General: He is not in acute distress.    Appearance: Normal appearance. He is well-developed. He is not ill-appearing or diaphoretic.  HENT:     Head: Normocephalic and atraumatic.     Right Ear: External ear normal.     Left Ear: External ear normal.     Nose: Nose normal.     Mouth/Throat:     Mouth: Mucous membranes are moist.     Pharynx: Oropharynx is clear.  Eyes:     General: Vision grossly intact. Gaze aligned appropriately. No scleral icterus.    Extraocular Movements: Extraocular movements intact.     Conjunctiva/sclera: Conjunctivae normal.     Pupils: Pupils are equal, round, and reactive to light.  Neck:     Musculoskeletal: Normal range of motion and neck supple. No neck rigidity.     Trachea: Trachea and phonation normal. No tracheal deviation.  Cardiovascular:     Rate and Rhythm: Normal rate and regular rhythm.     Pulses: Normal pulses.          Radial pulses are 2+ on the right side and 2+ on the left side.       Dorsalis pedis pulses are 2+ on the right side and 2+ on the left side.       Posterior tibial pulses are 2+ on the right side and 2+ on the left side.     Heart sounds: Normal heart sounds.  Pulmonary:     Effort: Pulmonary effort is normal. No accessory muscle usage or respiratory distress.     Breath sounds: Normal breath sounds and air entry. No rhonchi.  Chest:     Chest wall: No tenderness.  Abdominal:     General: Bowel sounds are normal. There is no distension.     Palpations: Abdomen is soft.     Tenderness: There is no abdominal tenderness. There is no guarding or rebound.  Musculoskeletal: Normal range of motion.     Right lower leg: Normal. He exhibits no tenderness. No edema.     Left lower leg: Normal. He exhibits no tenderness. No edema.  Feet:     Right foot:     Protective Sensation: 5 sites tested. 5 sites sensed.       Left foot:     Protective Sensation: 5 sites tested. 5 sites sensed.  Skin:    General: Skin is warm and dry.     Capillary Refill: Capillary refill takes less than 2 seconds.  Neurological:     Mental Status: He is alert.     GCS: GCS eye subscore is 4. GCS verbal subscore is 5. GCS motor subscore is 6.     Comments: Speech is clear and goal oriented, follows commands Major Cranial nerves without deficit, no facial droop Moves extremities without ataxia,  coordination intact  Psychiatric:        Mood and Affect: Mood normal.        Behavior: Behavior normal.    ED Treatments / Results  Labs (all labs ordered are listed, but only abnormal results are displayed) Labs Reviewed  COMPREHENSIVE METABOLIC PANEL - Abnormal; Notable for the following components:      Result Value   Total Protein 9.2 (*)    Total Bilirubin 7.7 (*)    All other components within normal limits  CBC WITH DIFFERENTIAL/PLATELET - Abnormal; Notable for the following components:   WBC 15.8 (*)    RBC 2.22 (*)    Hemoglobin 9.0 (*)    HCT 26.6 (*)    MCV 119.8 (*)    MCH 40.5 (*)    RDW 21.5 (*)    nRBC 13.0 (*)    Lymphs Abs 6.8 (*)    Monocytes Absolute 1.8 (*)    All other components within normal limits  RETICULOCYTES - Abnormal; Notable for the following components:   Retic Ct Pct 8.6 (*)    RBC. 2.22 (*)    Retic Count, Absolute 189.8 (*)    Immature Retic Fract 33.7 (*)    All other components within normal limits    EKG None  Radiology No results found.  Procedures Procedures (including critical care time)  Medications Ordered in ED Medications  sodium chloride flush (NS) 0.9 % injection 3 mL (has no administration in time range)  HYDROmorphone (DILAUDID) injection 2 mg (2 mg Subcutaneous Given 06/27/18 2012)  HYDROmorphone (DILAUDID) injection 2 mg (2 mg Subcutaneous Given 06/27/18 2127)  HYDROmorphone (DILAUDID) injection 2 mg (2 mg Subcutaneous Given 06/27/18 2228)  diphenhydrAMINE  (BENADRYL) injection 12.5 mg (12.5 mg Intravenous Given 06/27/18 2339)     Initial Impression / Assessment and Plan / ED Course  I have reviewed the triage vital signs and the nursing notes.  Pertinent labs & imaging results that were available during my care of the patient were reviewed by me and considered in my medical decision making (see chart for details).    28 year old male with HIV and sickle cell presents today for his sickle cell pain crisis.  Denies abnormal features of pain today.  Denies chest pain, shortness of breath, fever or recent illness.  Physical exam unremarkable, no sign of injury.  CBC and reticulocyte count notable for mild increase in leukocytosis, otherwise appears mostly baseline. CMP nonacute Vital signs have remained stable  Patient reassessed multiple times, each time requesting more pain medication.  3 doses of subcu Dilaudid given without improvement of pain.  Will admission for sickle cell pain crisis. - Discussed case with hospitalist service to see patient here in ED for admission.  Patient has been admitted to hospitalist service.   Note: Portions of this report may have been transcribed using voice recognition software. Every effort was made to ensure accuracy; however, inadvertent computerized transcription errors may still be present. Final Clinical Impressions(s) / ED Diagnoses   Final diagnoses:  Sickle cell pain crisis [facility])    ED Discharge Orders    None       Elizabeth Palau 06/27/18 2357    Mancel Bale, MD 06/29/18 1012

## 2018-06-28 DIAGNOSIS — Z21 Asymptomatic human immunodeficiency virus [HIV] infection status: Secondary | ICD-10-CM

## 2018-06-28 DIAGNOSIS — D5700 Hb-SS disease with crisis, unspecified: Principal | ICD-10-CM

## 2018-06-28 LAB — COMPREHENSIVE METABOLIC PANEL
ALT: 23 U/L (ref 0–44)
AST: 35 U/L (ref 15–41)
Albumin: 3.8 g/dL (ref 3.5–5.0)
Alkaline Phosphatase: 52 U/L (ref 38–126)
Anion gap: 5 (ref 5–15)
BUN: 10 mg/dL (ref 6–20)
CO2: 24 mmol/L (ref 22–32)
Calcium: 8.7 mg/dL — ABNORMAL LOW (ref 8.9–10.3)
Chloride: 108 mmol/L (ref 98–111)
Creatinine, Ser: 0.56 mg/dL — ABNORMAL LOW (ref 0.61–1.24)
GFR calc Af Amer: >60 mL/min (ref >60)
GFR calc non Af Amer: >60 mL/min (ref >60)
Glucose, Bld: 96 mg/dL (ref 70–99)
Potassium: 3.9 mmol/L (ref 3.5–5.1)
Sodium: 137 mmol/L (ref 135–145)
Total Bilirubin: 5.5 mg/dL — ABNORMAL HIGH (ref 0.3–1.2)
Total Protein: 8.3 g/dL — ABNORMAL HIGH (ref 6.5–8.1)

## 2018-06-28 LAB — RETICULOCYTES
Immature Retic Fract: 54.4 % — ABNORMAL HIGH (ref 2.3–15.9)
RBC.: 1.95 MIL/uL — ABNORMAL LOW (ref 4.22–5.81)
Retic Count, Absolute: 249.8 10*3/uL — ABNORMAL HIGH (ref 19.0–186.0)
Retic Ct Pct: 12.8 % — ABNORMAL HIGH (ref 0.4–3.1)

## 2018-06-28 LAB — MAGNESIUM: Magnesium: 1.9 mg/dL (ref 1.7–2.4)

## 2018-06-28 LAB — PHOSPHORUS: Phosphorus: 5.6 mg/dL — ABNORMAL HIGH (ref 2.5–4.6)

## 2018-06-28 MED ORDER — NALOXONE HCL 0.4 MG/ML IJ SOLN: 0.4000 mg | INTRAMUSCULAR | Status: DC | PRN

## 2018-06-28 MED ORDER — ONDANSETRON HCL 4 MG/2ML IJ SOLN: 4.0000 mg | Freq: Four times a day (QID) | INTRAMUSCULAR | Status: DC | PRN

## 2018-06-28 MED ORDER — DEXTROSE-NACL 5-0.45 % IV SOLN
INTRAVENOUS | Status: AC
  Administered 2018-06-28 (×2): via INTRAVENOUS

## 2018-06-28 MED ORDER — POLYETHYLENE GLYCOL 3350 17 G PO PACK: 17.0000 g | PACK | Freq: Every day | ORAL | Status: DC | PRN

## 2018-06-28 MED ORDER — SENNOSIDES-DOCUSATE SODIUM 8.6-50 MG PO TABS
1.0000 | ORAL_TABLET | Freq: Two times a day (BID) | ORAL | Status: DC
  Administered 2018-06-28 – 2018-07-03 (×6): 1 via ORAL
  Filled 2018-06-28 (×9): qty 1

## 2018-06-28 MED ORDER — ONDANSETRON HCL 4 MG/2ML IJ SOLN
4.0000 mg | INTRAMUSCULAR | Status: DC | PRN
  Administered 2018-06-28 – 2018-07-03 (×7): 4 mg via INTRAVENOUS
  Filled 2018-06-28 (×6): qty 2

## 2018-06-28 MED ORDER — SODIUM CHLORIDE 0.9 % IV SOLN
25.0000 mg | INTRAVENOUS | Status: DC | PRN
  Filled 2018-06-28: qty 0.5

## 2018-06-28 MED ORDER — ENOXAPARIN SODIUM 40 MG/0.4ML ~~LOC~~ SOLN
40.0000 mg | SUBCUTANEOUS | Status: DC
  Filled 2018-06-28 (×5): qty 0.4

## 2018-06-28 MED ORDER — ABACAVIR-DOLUTEGRAVIR-LAMIVUD 600-50-300 MG PO TABS
1.0000 | ORAL_TABLET | Freq: Every day | ORAL | Status: DC
  Administered 2018-06-28 – 2018-07-03 (×6): 1 via ORAL
  Filled 2018-06-28 (×6): qty 1

## 2018-06-28 MED ORDER — BISACODYL 5 MG PO TBEC: 5.0000 mg | DELAYED_RELEASE_TABLET | Freq: Every day | ORAL | Status: DC | PRN

## 2018-06-28 MED ORDER — CAMPHOR-MENTHOL 0.5-0.5 % EX LOTN
TOPICAL_LOTION | CUTANEOUS | Status: DC | PRN
  Administered 2018-06-30: 10:00:00 via TOPICAL
  Filled 2018-06-28: qty 222

## 2018-06-28 MED ORDER — HYDROXYUREA 500 MG PO CAPS
2000.0000 mg | ORAL_CAPSULE | Freq: Every day | ORAL | Status: DC
  Administered 2018-06-28 – 2018-07-02 (×6): 2000 mg via ORAL
  Filled 2018-06-28 (×6): qty 4

## 2018-06-28 MED ORDER — DIPHENHYDRAMINE HCL 25 MG PO CAPS
25.0000 mg | ORAL_CAPSULE | ORAL | Status: DC | PRN
  Administered 2018-06-28: 25 mg via ORAL
  Filled 2018-06-28: qty 1

## 2018-06-28 MED ORDER — HYDROXYZINE HCL 10 MG PO TABS
10.0000 mg | ORAL_TABLET | Freq: Three times a day (TID) | ORAL | Status: DC | PRN
  Filled 2018-06-28: qty 1

## 2018-06-28 MED ORDER — ALPRAZOLAM 1 MG PO TABS
1.0000 mg | ORAL_TABLET | Freq: Two times a day (BID) | ORAL | Status: DC | PRN
  Administered 2018-06-29 – 2018-07-02 (×3): 1 mg via ORAL
  Filled 2018-06-28 (×5): qty 1

## 2018-06-28 MED ORDER — SODIUM CHLORIDE 0.9% FLUSH: 9.0000 mL | INTRAVENOUS | Status: DC | PRN

## 2018-06-28 MED ORDER — ONDANSETRON HCL 4 MG PO TABS
4.0000 mg | ORAL_TABLET | ORAL | Status: DC | PRN
  Administered 2018-06-29 – 2018-07-03 (×2): 4 mg via ORAL
  Filled 2018-06-28 (×2): qty 1

## 2018-06-28 MED ORDER — HYDROMORPHONE 1 MG/ML IV SOLN
INTRAVENOUS | Status: DC
  Administered 2018-06-28: 6 mg via INTRAVENOUS
  Administered 2018-06-28: 4.29 mL via INTRAVENOUS
  Administered 2018-06-28: 1 mg via INTRAVENOUS
  Administered 2018-06-28: 30 mg via INTRAVENOUS
  Administered 2018-06-28: 11.5 mg via INTRAVENOUS
  Administered 2018-06-29: 1 mg via INTRAVENOUS
  Administered 2018-06-29: 5.5 mg via INTRAVENOUS
  Administered 2018-06-29: 1 mg via INTRAVENOUS
  Administered 2018-06-29: 1.5 mg via INTRAVENOUS
  Administered 2018-06-29: 1.6 mg via INTRAVENOUS
  Administered 2018-06-29: 3 mg via INTRAVENOUS
  Administered 2018-06-29: 30 mg via INTRAVENOUS
  Administered 2018-06-29: 8 mg via INTRAVENOUS
  Administered 2018-06-29 – 2018-06-30 (×2): 5 mg via INTRAVENOUS
  Administered 2018-06-30: 5.5 mg via INTRAVENOUS
  Administered 2018-06-30: 9 mg via INTRAVENOUS
  Administered 2018-06-30: 8.5 mg via INTRAVENOUS
  Administered 2018-06-30: 30 mg via INTRAVENOUS
  Filled 2018-06-28 (×3): qty 30

## 2018-06-28 MED ORDER — DIPHENHYDRAMINE HCL 12.5 MG/5ML PO ELIX
25.0000 mg | ORAL_SOLUTION | Freq: Four times a day (QID) | ORAL | Status: DC | PRN
  Administered 2018-06-28 – 2018-06-30 (×2): 25 mg via ORAL
  Filled 2018-06-28 (×2): qty 10

## 2018-06-28 MED ORDER — HYDROMORPHONE 1 MG/ML IV SOLN
INTRAVENOUS | Status: DC
  Administered 2018-06-28 (×2): 4 mg via INTRAVENOUS
  Administered 2018-06-28: 30 mg via INTRAVENOUS
  Filled 2018-06-28: qty 30

## 2018-06-28 NOTE — Progress Notes (Signed)
Subjective: Martin [name], a 28 year old male with a medical history significant for sickle cell disease, HIV on antiretroviral therapy, and chronic pain syndrome was admitted and sickle cell pain crisis. Patient states that he is from [facility] and attributes current pain crisis to sleeping in a very cool environment.  He states that he awakened with intense pain that was uncontrolled by Percocet 10-325 mg.  Patient states that pain intensity has not improved very much on IV Dilaudid PCA.  Also patient endorses itching.  Pain intensity is 9/10 characterized as constant and occasionally sharp. Patient is afebrile and maintaining oxygen saturation above 90% on RA.  Patient denies paresthesias, dizziness, nausea, vomiting, or diarrhea.  Objective:  Vital signs in last 24 hours:  Vitals:   06/28/18 0459 06/28/18 0621 06/28/18 0823 06/28/18 1000  BP: 95/63 108/71  (!) 99/56  Pulse: 67 70  74  Resp: 11 13 17 13   Temp: 97.7 F (36.5 C) 98 F (36.7 C)  98.3 F (36.8 C)  TempSrc: Oral Oral  Oral  SpO2: 96% 96% 94% 95%  Weight:      Height:        Intake/Output from previous day:   Intake/Output Summary (Last 24 hours) at 06/28/2018 1011 Last data filed at 06/28/2018 0501 Gross per 24 hour  Intake 480 ml  Output 300 ml  Net 180 ml    Physical Exam: General: Alert, awake, oriented x3, in no acute distress.  HEENT: [place]/AT PEERL, EOMI Neck: Trachea midline,  no masses, no thyromegal,y no JVD, no carotid bruit OROPHARYNX:  Moist, No exudate/ erythema/lesions.  Heart: Regular rate and rhythm, without murmurs, rubs, gallops, PMI non-displaced, no heaves or thrills on palpation.  Lungs: Clear to auscultation, no wheezing or rhonchi noted. No increased vocal fremitus resonant to percussion  Abdomen: Soft, nontender, nondistended, positive bowel sounds, no masses no hepatosplenomegaly noted..  Neuro: No focal neurological deficits noted cranial nerves II through XII grossly intact.  DTRs 2+ bilaterally upper and lower extremities. Strength 5 out of 5 in bilateral upper and lower extremities. Musculoskeletal: No warm swelling or erythema around joints, no spinal tenderness noted. Psychiatric: Patient alert and oriented x3, good insight and cognition, good recent to remote recall. Lymph node survey: No cervical axillary or inguinal lymphadenopathy noted.  Lab Results:  Basic Metabolic Panel:    Component Value Date/Time   NA 137 06/28/2018 0516   K 3.9 06/28/2018 0516   CL 108 06/28/2018 0516   CO2 24 06/28/2018 0516   BUN 10 06/28/2018 0516   CREATININE 0.56 (L) 06/28/2018 0516   GLUCOSE 96 06/28/2018 0516   CALCIUM 8.7 (L) 06/28/2018 0516   CBC:    Component Value Date/Time   WBC 15.8 (H) 06/27/2018 1902   HGB 9.0 (L) 06/27/2018 1902   HCT 26.6 (L) 06/27/2018 1902   PLT 322 06/27/2018 1902   MCV 119.8 (H) 06/27/2018 1902   NEUTROABS 7.0 06/27/2018 1902   LYMPHSABS 6.8 (H) 06/27/2018 1902   MONOABS 1.8 (H) 06/27/2018 1902   EOSABS 0.2 06/27/2018 1902   BASOSABS 0.0 06/27/2018 1902    No results found for this or any previous visit (from the past 240 hour(s)).  Studies/Results: No results found.  Medications: Scheduled Meds: . abacavir-dolutegravir-lamiVUDine  1 tablet Oral Daily  . enoxaparin (LOVENOX) injection  40 mg Subcutaneous Q24H  . HYDROmorphone   Intravenous Q4H  . hydroxyurea  2,000 mg Oral QHS  . senna-docusate  1 tablet Oral BID   Continuous Infusions: .  dextrose 5 % and 0.45% NaCl 100 mL/hr at 06/28/18 0113   PRN Meds:.ALPRAZolam, bisacodyl, camphor-menthol, diphenhydrAMINE, hydrOXYzine, naloxone **AND** sodium chloride flush, ondansetron (ZOFRAN) IV, ondansetron **OR** ondansetron (ZOFRAN) IV, polyethylene glycol   Assessment/Plan: Active Problems:   Sickle cell disease with crisis (HCC)   GERD (gastroesophageal reflux disease)   HIV (human immunodeficiency virus infection) (HCC)   Sickle cell crisis (HCC)  Sickle cell  anemia with acute pain crisis: Continue IV Dilaudid PCA per weight-based protocol Continue D5 0.45% saline at 100 mL/h Folic acid 1 mg daily Hydroxyurea 2000 mg daily  Chronic pain syndrome: Hold Percocet 10-3 25, use Dilaudid PCA substitute  Leukocytosis: WBCs 15.8, patient afebrile.  No signs of acute infection.  Chest x-ray did not show any acute cardiopulmonary process Repeat CBC in a.m.  HIV disease: Continue antiretroviral therapy  Anemia of chronic disease: Hemoglobin 9.0, consistent with patient's baseline.  Continue to monitor closely CBC in a.m.  Code Status: Full Code Family Communication: N/A Disposition Plan: Not yet ready for discharge   Ely Spragg Rennis Petty  APRN, [name], [name] [facility] [address] [place], [place] [address] [phone]  If 5PM-7AM, please contact night-coverage.  06/28/2018, 10:11 AM  LOS: 1 day

## 2018-06-28 NOTE — ED Notes (Signed)
ED TO INPATIENT HANDOFF REPORT  ED Nurse Name and Phone #: Huntley Dec [phone]  S Name/Age/Gender Martin [name] 28 y.o. male Room/Bed: WA22/WA22  Code Status   Code Status: Prior  Home/SNF/Other Home Patient oriented to: self Is this baseline? Yes   Triage Complete: Triage complete  Chief Complaint sickle cell crisis  Triage Note Patient c/o sickle cell pain of the lower back, bilateral hips, and bilateral shoulders that started at 0500 today. Patient denies any chest pain or SOB.   Allergies Allergies  Allergen Reactions  . Ketorolac Swelling    Patient reports facial edema and left arm edema after administration.    Level of Care/Admitting Diagnosis ED Disposition    ED Disposition Condition Comment   Admit  Hospital Area: [facility] [[id number]]  Level of Care: Med-Surg [16]  Diagnosis: Sickle cell crisis [facility]) [[id number]]  Admitting Physician: Therisa Doyne [[id number]]  Attending Physician: Therisa Doyne [[id number]]  Estimated length of stay: 3 - 4 days  Certification:: I certify this patient will need inpatient services for at least 2 midnights  PT Class (Do Not Modify): Inpatient [101]  PT Acc Code (Do Not Modify): Private [1]       B Medical/Surgery History Past Medical History:  Diagnosis Date  . Anxiety   . HIV (human immunodeficiency virus infection) (HCC)   . Sickle cell crisis (HCC)   . 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HCC)    History reviewed. No pertinent surgical history.   A IV Location/Drains/Wounds Patient Lines/Drains/Airways Status   Active Line/Drains/Airways    Name:   Placement date:   Placement time:   Site:   Days:   Peripheral IV 06/27/18 Left;Lateral Hand   06/27/18    2317    Hand   1          Intake/Output Last 24 hours No intake or output data in the 24 hours ending 06/28/18 0019  Labs/Imaging Results for orders placed or performed during the hospital encounter of 06/27/18 (from the past 48 hour(s))   Comprehensive metabolic panel     Status: Abnormal   Collection Time: 06/27/18  7:02 PM  Result Value Ref Range   Sodium 140 135 - 145 mmol/L   Potassium 3.8 3.5 - 5.1 mmol/L   Chloride 108 98 - 111 mmol/L   CO2 24 22 - 32 mmol/L   Glucose, Bld 99 70 - 99 mg/dL   BUN 10 6 - 20 mg/dL   Creatinine, Ser 2.97 0.61 - 1.24 mg/dL   Calcium 8.9 8.9 - 98.9 mg/dL   Total Protein 9.2 (H) 6.5 - 8.1 g/dL   Albumin 4.5 3.5 - 5.0 g/dL   AST 40 15 - 41 U/L   ALT 25 0 - 44 U/L   Alkaline Phosphatase 56 38 - 126 U/L   Total Bilirubin 7.7 (H) 0.3 - 1.2 mg/dL   GFR calc non Af Amer >60 >60 mL/min   GFR calc Af Amer >60 >60 mL/min   Anion gap 8 5 - 15    Comment: Performed at [facility], [address], [place], [place] [address]  CBC with Differential     Status: Abnormal   Collection Time: 06/27/18  7:02 PM  Result Value Ref Range   WBC 15.8 (H) 4.0 - 10.5 K/uL    Comment: WHITE COUNT CONFIRMED ON SMEAR ADJUSTED FOR NUCLEATED RBC'S    RBC 2.22 (L) 4.22 - 5.81 MIL/uL   Hemoglobin 9.0 (L) 13.0 - 17.0 g/dL  HCT 26.6 (L) 39.0 - 52.0 %   MCV 119.8 (H) 80.0 - 100.0 fL   MCH 40.5 (H) 26.0 - 34.0 pg   MCHC 33.8 30.0 - 36.0 g/dL   RDW 16.1 (H) 09.6 - 04.5 %   Platelets 322 150 - 400 K/uL   nRBC 13.0 (H) 0.0 - 0.2 %   Neutrophils Relative % 45 %   Neutro Abs 7.0 1.7 - 7.7 K/uL   Lymphocytes Relative 42 %   Lymphs Abs 6.8 (H) 0.7 - 4.0 K/uL   Monocytes Relative 12 %   Monocytes Absolute 1.8 (H) 0.1 - 1.0 K/uL   Eosinophils Relative 1 %   Eosinophils Absolute 0.2 0.0 - 0.5 K/uL   Basophils Relative 0 %   Basophils Absolute 0.0 0.0 - 0.1 K/uL   Immature Granulocytes 0 %   Abs Immature Granulocytes 0.06 0.00 - 0.07 K/uL   Carollee Massed Bodies PRESENT    Polychromasia PRESENT    Sickle Cells PRESENT    Target Cells PRESENT     Comment: Performed at [facility], [address] [place], [place] [address]  Reticulocytes     Status: Abnormal   Collection  Time: 06/27/18  7:02 PM  Result Value Ref Range   Retic Ct Pct 8.6 (H) 0.4 - 3.1 %    Comment: RESULTS CONFIRMED BY MANUAL DILUTION   RBC. 2.22 (L) 4.22 - 5.81 MIL/uL   Retic Count, Absolute 189.8 (H) 19.0 - 186.0 K/uL   Immature Retic Fract 33.7 (H) 2.3 - 15.9 %    Comment: Performed at [facility], [address] [place], [place] [address]   No results found.  Pending Labs [employer] (From admission, onward)    Start     Ordered   Signed and Held  Magnesium  Tomorrow morning,   R     Signed and Held   Signed and Held  Phosphorus  Tomorrow morning,   R     Signed and Held   Signed and Held  Reticulocytes  Add-on,   R     Signed and Held   Signed and Held  Reticulocytes  Tomorrow morning,   R     Signed and Held   Signed and Held  Comprehensive metabolic panel  Tomorrow morning,   R     Signed and Held          Vitals/Pain Today's Vitals   06/27/18 2149 06/27/18 2200 06/27/18 2240 06/27/18 2300  BP: 111/68 109/72  (!) 109/59  Pulse: 92 90  89  Resp: 19 13  (!) 29  Temp:      TempSrc:      SpO2: 95% 95%  93%  Weight:      Height:      PainSc:   8      Isolation Precautions No active isolations  Medications Medications  sodium chloride flush (NS) 0.9 % injection 3 mL (has no administration in time range)  HYDROmorphone (DILAUDID) injection 2 mg (2 mg Subcutaneous Given 06/27/18 2012)  HYDROmorphone (DILAUDID) injection 2 mg (2 mg Subcutaneous Given 06/27/18 2127)  HYDROmorphone (DILAUDID) injection 2 mg (2 mg Subcutaneous Given 06/27/18 2228)  diphenhydrAMINE (BENADRYL) injection 12.5 mg (12.5 mg Intravenous Given 06/27/18 2339)    Mobility walks Low fall risk   Focused Assessments   R Recommendations: See Admitting Provider Note  Report given to:   Additional Notes:

## 2018-06-29 LAB — CBC
HCT: 21.3 % — ABNORMAL LOW (ref 39.0–52.0)
Hemoglobin: 8.7 g/dL — ABNORMAL LOW (ref 13.0–17.0)
MCH: 49.2 pg — ABNORMAL HIGH (ref 26.0–34.0)
MCHC: 40.8 g/dL — ABNORMAL HIGH (ref 30.0–36.0)
MCV: 120.3 fL — ABNORMAL HIGH (ref 80.0–100.0)
Platelets: 291 10*3/uL (ref 150–400)
RBC: 1.77 MIL/uL — ABNORMAL LOW (ref 4.22–5.81)
RDW: 23.9 % — ABNORMAL HIGH (ref 11.5–15.5)
WBC: 10.7 10*3/uL — ABNORMAL HIGH (ref 4.0–10.5)
nRBC: 2.3 % — ABNORMAL HIGH (ref 0.0–0.2)

## 2018-06-29 MED ORDER — METOCLOPRAMIDE HCL 5 MG/ML IJ SOLN
10.0000 mg | Freq: Once | INTRAMUSCULAR | Status: AC
  Administered 2018-06-29: 10 mg via INTRAVENOUS

## 2018-06-29 MED ORDER — POLYVINYL ALCOHOL 1.4 % OP SOLN
1.0000 [drp] | OPHTHALMIC | Status: DC | PRN
  Administered 2018-06-30: 1 [drp] via OPHTHALMIC
  Filled 2018-06-29: qty 15

## 2018-06-29 MED ORDER — DIPHENHYDRAMINE HCL 50 MG/ML IJ SOLN
25.0000 mg | Freq: Once | INTRAMUSCULAR | Status: AC
  Administered 2018-06-29: 25 mg via INTRAVENOUS
  Filled 2018-06-29: qty 1

## 2018-06-29 MED ORDER — ASPIRIN-ACETAMINOPHEN-CAFFEINE 250-250-65 MG PO TABS: 2.0000 | ORAL_TABLET | Freq: Once | ORAL | Status: DC

## 2018-06-29 MED ORDER — DIPHENHYDRAMINE HCL 50 MG/ML IJ SOLN
25.0000 mg | Freq: Once | INTRAMUSCULAR | Status: DC
  Administered 2018-06-29: 25 mg via INTRAVENOUS
  Filled 2018-06-29: qty 1

## 2018-06-29 MED ORDER — OXYCODONE HCL 5 MG PO TABS
10.0000 mg | ORAL_TABLET | ORAL | Status: DC | PRN
  Administered 2018-06-29 – 2018-07-01 (×5): 10 mg via ORAL
  Filled 2018-06-29 (×4): qty 2

## 2018-06-29 MED ORDER — METOCLOPRAMIDE HCL 5 MG/ML IJ SOLN
10.0000 mg | Freq: Once | INTRAMUSCULAR | Status: DC
  Administered 2018-06-29: 10 mg via INTRAVENOUS
  Filled 2018-06-29: qty 2

## 2018-06-30 MED ORDER — OXYCODONE HCL 5 MG PO TABS
5.0000 mg | ORAL_TABLET | ORAL | Status: DC
  Administered 2018-06-30 – 2018-07-01 (×4): 5 mg via ORAL
  Filled 2018-06-30 (×5): qty 1

## 2018-06-30 MED ORDER — PROMETHAZINE HCL 25 MG/ML IJ SOLN
12.5000 mg | INTRAMUSCULAR | Status: DC | PRN
  Administered 2018-06-30 – 2018-07-03 (×3): 12.5 mg via INTRAVENOUS
  Filled 2018-06-30 (×3): qty 1

## 2018-06-30 MED ORDER — HYDROMORPHONE HCL 1 MG/ML IJ SOLN
1.0000 mg | INTRAMUSCULAR | Status: DC | PRN
  Administered 2018-06-30 – 2018-07-01 (×4): 1 mg via INTRAVENOUS
  Filled 2018-06-30 (×4): qty 1

## 2018-06-30 MED ORDER — OXYCODONE-ACETAMINOPHEN 5-325 MG PO TABS
1.0000 | ORAL_TABLET | ORAL | Status: DC
  Administered 2018-06-30 – 2018-07-01 (×4): 1 via ORAL
  Filled 2018-06-30 (×5): qty 1

## 2018-06-30 NOTE — Progress Notes (Signed)
Subjective: Martin [name], a 28 year old male with a medical history significant for sickle cell disease, HIV on antiretroviral therapy, and chronic pain syndrome was admitted and sickle cell pain crisis. Patient states that pain intensity has improved minimally overnight.  Current pain intensity 8/10 primarily to low back and lower extremities.  Patient states that he cannot manage at home at current level of pain.  Patient's pain goal is 4/10.  Patient denies headache, dizziness, chest pain, nausea, vomiting, or diarrhea.  Objective:  Vital signs in last 24 hours:  Vitals:   06/29/18 1614 06/29/18 1849 06/29/18 1935 06/29/18 2128  BP:  117/70  104/74  Pulse:  91  92  Resp: 16 (!) 8 10 12   Temp:  98.3 F (36.8 C)  98.4 F (36.9 C)  TempSrc:  Oral  Oral  SpO2: 98% 92% 90% 97%  Weight:      Height:        Intake/Output from previous day:   Intake/Output Summary (Last 24 hours) at 06/30/2018 0129 Last data filed at 06/29/2018 1905 Gross per 24 hour  Intake 1856.81 ml  Output 2326 ml  Net -469.19 ml    Physical Exam: General: Alert, awake, oriented x3, in no acute distress.  HEENT: [place]/AT PEERL, EOMI Neck: Trachea midline,  no masses, no thyromegal,y no JVD, no carotid bruit OROPHARYNX:  Moist, No exudate/ erythema/lesions.  Heart: Regular rate and rhythm, without murmurs, rubs, gallops, PMI non-displaced, no heaves or thrills on palpation.  Lungs: Clear to auscultation, no wheezing or rhonchi noted. No increased vocal fremitus resonant to percussion  Abdomen: Soft, nontender, nondistended, positive bowel sounds, no masses no hepatosplenomegaly noted..  Neuro: No focal neurological deficits noted cranial nerves II through XII grossly intact. DTRs 2+ bilaterally upper and lower extremities. Strength 5 out of 5 in bilateral upper and lower extremities. Musculoskeletal: No warm swelling or erythema around joints, no spinal tenderness noted. Psychiatric: Patient alert and  oriented x3, good insight and cognition, good recent to remote recall. Lymph node survey: No cervical axillary or inguinal lymphadenopathy noted.  Lab Results:  Basic Metabolic Panel:    Component Value Date/Time   NA 137 06/28/2018 0516   K 3.9 06/28/2018 0516   CL 108 06/28/2018 0516   CO2 24 06/28/2018 0516   BUN 10 06/28/2018 0516   CREATININE 0.56 (L) 06/28/2018 0516   GLUCOSE 96 06/28/2018 0516   CALCIUM 8.7 (L) 06/28/2018 0516   CBC:    Component Value Date/Time   WBC 10.7 (H) 06/29/2018 1045   HGB 8.7 (L) 06/29/2018 1045   HCT 21.3 (L) 06/29/2018 1045   PLT 291 06/29/2018 1045   MCV 120.3 (H) 06/29/2018 1045   NEUTROABS 7.0 06/27/2018 1902   LYMPHSABS 6.8 (H) 06/27/2018 1902   MONOABS 1.8 (H) 06/27/2018 1902   EOSABS 0.2 06/27/2018 1902   BASOSABS 0.0 06/27/2018 1902    No results found for this or any previous visit (from the past 240 hour(s)).  Studies/Results: No results found.  Medications: Scheduled Meds: . abacavir-dolutegravir-lamiVUDine  1 tablet Oral Daily  . enoxaparin (LOVENOX) injection  40 mg Subcutaneous Q24H  . HYDROmorphone   Intravenous Q4H  . hydroxyurea  2,000 mg Oral QHS  . senna-docusate  1 tablet Oral BID   Continuous Infusions: PRN Meds:.ALPRAZolam, bisacodyl, camphor-menthol, diphenhydrAMINE, hydrOXYzine, naloxone **AND** sodium chloride flush, ondansetron (ZOFRAN) IV, ondansetron **OR** ondansetron (ZOFRAN) IV, oxyCODONE, polyethylene glycol, polyvinyl alcohol   Assessment/Plan: Active Problems:   Sickle cell disease with crisis (HCC)  GERD (gastroesophageal reflux disease)   HIV (human immunodeficiency virus infection) (HCC)   Sickle cell pain crisis (HCC)  Sickle cell anemia with acute pain crisis: Continue IV Dilaudid PCA Oxycodone 10 mg every 4 hours as needed for moderate to severe breakthrough pain Continue IV fluids to [facility] Folic acid 1 mg daily Continue hydroxyurea 2000 mg daily  Chronic pain syndrome: Oxycodone  10 mg every 4 hours as needed for moderate to severe breakthrough pain  Leukocytosis: WBC stable.  No signs of acute infection. Continue to follow CBC  HIV disease: Continue antiretroviral therapy  Anemia of chronic disease: Hemoglobin stable, consistent with patient's baseline.  Continue to monitor closely  Code Status: Full Code Family Communication: N/A Disposition Plan: Not yet ready for discharge  [name]  APRN, [name], [name] [facility] [address] [place], [place] [address] [phone]  If 5PM-7AM, please contact night-coverage.  06/30/2018, 1:29 AM  LOS: 3 days

## 2018-06-30 NOTE — Progress Notes (Signed)
Subjective: Martin [name], a 28 year old male with a medical history significant for sickle cell disease, HIV on antiretroviral therapy, and chronic pain syndrome was admitted and sickle cell pain crisis.  Patient states that pain is improved to 6-7/10 overnight.  Patient has not ambulated without assistance.  Patient's pain goal is 4/10.  Patient states that he had a severe headache on last night, which resolved after Tylenol.  Patient denies headache, dizziness, chest pain, nausea, vomiting, or diarrhea.  Patient is afebrile and maintaining oxygen saturation above 90% on RA.  Objective:  Vital signs in last 24 hours:  Vitals:   06/30/18 0614 06/30/18 0800 06/30/18 0948 06/30/18 1153  BP: (!) 105/54  108/75   Pulse: 94  90   Resp: 15 11 11  (!) 7  Temp: 98.6 F (37 C)  98 F (36.7 C)   TempSrc: Oral  Oral   SpO2: 92% 95% 99% 93%  Weight: 72.7 kg     Height:        Intake/Output from previous day:   Intake/Output Summary (Last 24 hours) at 06/30/2018 1222 Last data filed at 06/30/2018 1100 Gross per 24 hour  Intake 480 ml  Output 2376 ml  Net -1896 ml    Physical Exam: General: Alert, awake, oriented x3, in no acute distress.  HEENT: [place]/AT PEERL, EOMI Neck: Trachea midline,  no masses, no thyromegal,y no JVD, no carotid bruit OROPHARYNX:  Moist, No exudate/ erythema/lesions.  Heart: Regular rate and rhythm, without murmurs, rubs, gallops, PMI non-displaced, no heaves or thrills on palpation.  Lungs: Clear to auscultation, no wheezing or rhonchi noted. No increased vocal fremitus resonant to percussion  Abdomen: Soft, nontender, nondistended, positive bowel sounds, no masses no hepatosplenomegaly noted..  Neuro: No focal neurological deficits noted cranial nerves II through XII grossly intact. DTRs 2+ bilaterally upper and lower extremities. Strength 5 out of 5 in bilateral upper and lower extremities. Musculoskeletal: No warm swelling or erythema around joints, no spinal  tenderness noted. Psychiatric: Patient alert and oriented x3, good insight and cognition, good recent to remote recall. Lymph node survey: No cervical axillary or inguinal lymphadenopathy noted.  Lab Results:  Basic Metabolic Panel:    Component Value Date/Time   NA 137 06/28/2018 0516   K 3.9 06/28/2018 0516   CL 108 06/28/2018 0516   CO2 24 06/28/2018 0516   BUN 10 06/28/2018 0516   CREATININE 0.56 (L) 06/28/2018 0516   GLUCOSE 96 06/28/2018 0516   CALCIUM 8.7 (L) 06/28/2018 0516   CBC:    Component Value Date/Time   WBC 10.7 (H) 06/29/2018 1045   HGB 8.7 (L) 06/29/2018 1045   HCT 21.3 (L) 06/29/2018 1045   PLT 291 06/29/2018 1045   MCV 120.3 (H) 06/29/2018 1045   NEUTROABS 7.0 06/27/2018 1902   LYMPHSABS 6.8 (H) 06/27/2018 1902   MONOABS 1.8 (H) 06/27/2018 1902   EOSABS 0.2 06/27/2018 1902   BASOSABS 0.0 06/27/2018 1902    No results found for this or any previous visit (from the past 240 hour(s)).  Studies/Results: No results found.  Medications: Scheduled Meds: . abacavir-dolutegravir-lamiVUDine  1 tablet Oral Daily  . enoxaparin (LOVENOX) injection  40 mg Subcutaneous Q24H  . hydroxyurea  2,000 mg Oral QHS  . oxyCODONE-acetaminophen  1 tablet Oral Q4H while awake   And  . oxyCODONE  5 mg Oral Q4H while awake  . senna-docusate  1 tablet Oral BID   Continuous Infusions: PRN Meds:.ALPRAZolam, bisacodyl, camphor-menthol, diphenhydrAMINE, HYDROmorphone (DILAUDID) injection, hydrOXYzine, ondansetron **OR**  ondansetron (ZOFRAN) IV, oxyCODONE, polyethylene glycol, polyvinyl alcohol   Assessment/Plan: Active Problems:   Sickle cell disease with crisis (HCC)   GERD (gastroesophageal reflux disease)   HIV (human immunodeficiency virus infection) (HCC)   Sickle cell pain crisis (HCC)  Sickle cell anemia with acute pain crisis: Discontinued IV Dilaudid PCA Percocet 10-325 mg every 4 hours while awake per home dose Dilaudid 1 mg every 4 hours as needed for  severe, breakthrough pain IV fluids continued at [facility] Maintain oxygen saturation above 90% Ambulate in halls  Sickle cell anemia: Stable.  Continue home medications  Chronic pain syndrome: Continue home medications  Leukocytosis:  WBC stable.  No signs of infection.  Patient afebrile  HIV disease: Continue antiretroviral therapy   Code Status: Full Code Family Communication: N/A Disposition Plan: Not yet ready for discharge  Sanjuanita Condrey Rennis Petty  APRN, [name], [name] Patient [facility] [address] [place], [place] [address] [phone]  If 5PM-7AM, please contact night-coverage.  06/30/2018, 12:22 PM  LOS: 3 days

## 2018-07-01 MED ORDER — OXYCODONE HCL 5 MG PO TABS
5.0000 mg | ORAL_TABLET | ORAL | Status: DC | PRN
  Administered 2018-07-01 – 2018-07-03 (×4): 5 mg via ORAL
  Filled 2018-07-01 (×4): qty 1

## 2018-07-01 MED ORDER — DIPHENHYDRAMINE HCL 25 MG PO CAPS
25.0000 mg | ORAL_CAPSULE | ORAL | Status: DC | PRN
  Filled 2018-07-01: qty 1

## 2018-07-01 MED ORDER — NALOXONE HCL 0.4 MG/ML IJ SOLN: 0.4000 mg | INTRAMUSCULAR | Status: DC | PRN

## 2018-07-01 MED ORDER — HYDROMORPHONE HCL 2 MG/ML IJ SOLN
2.0000 mg | Freq: Once | INTRAMUSCULAR | Status: AC
  Administered 2018-07-01: 2 mg via INTRAVENOUS
  Filled 2018-07-01: qty 1

## 2018-07-01 MED ORDER — OXYCODONE-ACETAMINOPHEN 5-325 MG PO TABS
1.0000 | ORAL_TABLET | ORAL | Status: DC | PRN
  Administered 2018-07-01 – 2018-07-03 (×4): 1 via ORAL
  Filled 2018-07-01 (×4): qty 1

## 2018-07-01 MED ORDER — HYDROMORPHONE 1 MG/ML IV SOLN
INTRAVENOUS | Status: DC
  Administered 2018-07-01: 9.5 mg via INTRAVENOUS
  Administered 2018-07-01: 30 mg via INTRAVENOUS
  Administered 2018-07-01: 9 mg via INTRAVENOUS
  Administered 2018-07-01: 30 mg via INTRAVENOUS
  Administered 2018-07-01: 6.5 mg via INTRAVENOUS
  Administered 2018-07-02: 1 mg via INTRAVENOUS
  Administered 2018-07-02: 7.5 mg via INTRAVENOUS
  Administered 2018-07-02: 30 mg via INTRAVENOUS
  Administered 2018-07-02: 6 mg via INTRAVENOUS
  Administered 2018-07-02: 2.5 mg via INTRAVENOUS
  Administered 2018-07-02: 13 mg via INTRAVENOUS
  Administered 2018-07-02: 8.5 mg via INTRAVENOUS
  Administered 2018-07-03: 30 mg via INTRAVENOUS
  Administered 2018-07-03: 10 mg via INTRAVENOUS
  Administered 2018-07-03: 5.5 mg via INTRAVENOUS
  Administered 2018-07-03: 4 mg via INTRAVENOUS
  Administered 2018-07-03: 7.5 mg via INTRAVENOUS
  Filled 2018-07-01 (×4): qty 30

## 2018-07-01 MED ORDER — ONDANSETRON HCL 4 MG/2ML IJ SOLN
4.0000 mg | Freq: Four times a day (QID) | INTRAMUSCULAR | Status: DC | PRN
  Filled 2018-07-01 (×2): qty 2

## 2018-07-01 MED ORDER — SODIUM CHLORIDE 0.9% FLUSH: 9.0000 mL | INTRAVENOUS | Status: DC | PRN

## 2018-07-01 MED ORDER — SODIUM CHLORIDE 0.9 % IV SOLN
25.0000 mg | INTRAVENOUS | Status: DC | PRN
  Administered 2018-07-01: 25 mg via INTRAVENOUS
  Filled 2018-07-01: qty 25
  Filled 2018-07-01: qty 0.5

## 2018-07-01 NOTE — Progress Notes (Cosign Needed)
Subjective: Martin [name], a 28 year old male with a medical history significant for sickle cell disease, HIV on antiretroviral therapy, and chronic pain syndrome was admitted and sickle cell pain crisis.  Patient states that pain returned overnight.  Current pain intensity 9/10 characterized as constant and throbbing.  He states that he does not feel well and has been unable to get comfortable.  Patient also endorses nausea.  Patient denies headache, dizziness, chest pain, vomiting, diarrhea, or constipation.  Objective:  Vital signs in last 24 hours:  Vitals:   07/01/18 0231 07/01/18 0642 07/01/18 0700 07/01/18 0942  BP:  112/64  107/62  Pulse: 96 (!) 107  88  Resp:  16 14 11   Temp:  99.5 F (37.5 C)  97.8 F (36.6 C)  TempSrc:  Oral  Oral  SpO2:  90% 92% 92%  Weight:      Height:        Intake/Output from previous day:   Intake/Output Summary (Last 24 hours) at 07/01/2018 0959 Last data filed at 07/01/2018 0723 Gross per 24 hour  Intake 1320 ml  Output 1800 ml  Net -480 ml    Physical Exam: General: Alert, awake, oriented x3, in no acute distress.  HEENT: [place]/AT PEERL, EOMI Neck: Trachea midline,  no masses, no thyromegal,y no JVD, no carotid bruit OROPHARYNX:  Moist, No exudate/ erythema/lesions.  Heart: Regular rate and rhythm, without murmurs, rubs, gallops, PMI non-displaced, no heaves or thrills on palpation.  Lungs: Clear to auscultation, no wheezing or rhonchi noted. No increased vocal fremitus resonant to percussion  Abdomen: Soft, nontender, nondistended, positive bowel sounds, no masses no hepatosplenomegaly noted..  Neuro: No focal neurological deficits noted cranial nerves II through XII grossly intact. DTRs 2+ bilaterally upper and lower extremities. Strength 5 out of 5 in bilateral upper and lower extremities. Musculoskeletal: No warm swelling or erythema around joints, no spinal tenderness noted. Psychiatric: Patient alert and oriented x3, good insight  and cognition, good recent to remote recall. Lymph node survey: No cervical axillary or inguinal lymphadenopathy noted.  Lab Results:  Basic Metabolic Panel:    Component Value Date/Time   NA 137 06/28/2018 0516   K 3.9 06/28/2018 0516   CL 108 06/28/2018 0516   CO2 24 06/28/2018 0516   BUN 10 06/28/2018 0516   CREATININE 0.56 (L) 06/28/2018 0516   GLUCOSE 96 06/28/2018 0516   CALCIUM 8.7 (L) 06/28/2018 0516   CBC:    Component Value Date/Time   WBC 10.7 (H) 06/29/2018 1045   HGB 8.7 (L) 06/29/2018 1045   HCT 21.3 (L) 06/29/2018 1045   PLT 291 06/29/2018 1045   MCV 120.3 (H) 06/29/2018 1045   NEUTROABS 7.0 06/27/2018 1902   LYMPHSABS 6.8 (H) 06/27/2018 1902   MONOABS 1.8 (H) 06/27/2018 1902   EOSABS 0.2 06/27/2018 1902   BASOSABS 0.0 06/27/2018 1902    No results found for this or any previous visit (from the past 240 hour(s)).  Studies/Results: No results found.  Medications: Scheduled Meds: . abacavir-dolutegravir-lamiVUDine  1 tablet Oral Daily  . enoxaparin (LOVENOX) injection  40 mg Subcutaneous Q24H  . HYDROmorphone   Intravenous Q4H  . hydroxyurea  2,000 mg Oral QHS  . senna-docusate  1 tablet Oral BID   Continuous Infusions: . diphenhydrAMINE     PRN Meds:.ALPRAZolam, bisacodyl, camphor-menthol, diphenhydrAMINE, diphenhydrAMINE **OR** diphenhydrAMINE, hydrOXYzine, naloxone **AND** sodium chloride flush, ondansetron **OR** ondansetron (ZOFRAN) IV, ondansetron (ZOFRAN) IV, oxyCODONE-acetaminophen **AND** oxyCODONE, polyethylene glycol, polyvinyl alcohol, promethazine  Assessment/Plan: Active Problems:  Sickle cell disease with crisis (HCC)   GERD (gastroesophageal reflux disease)   HIV (human immunodeficiency virus infection) (HCC)   Sickle cell pain crisis (HCC)  Sickle cell anemia with acute pain crisis: Will restart weight-based Dilaudid PCA Percocet 10-325 mg every 4 hours as needed IV fluids continued at [facility] Maintain oxygen saturation above  90%  Sickle cell anemia: Stable.  Continue home medications  Chronic pain syndrome: Continue home medications  Leukocytosis: WBC stable.  No signs of acute infection.  Patient afebrile Follow CBC  HIV disease: Continue antiretroviral therapy  Code Status: Full Code Family Communication: N/A Disposition Plan: Not yet ready for discharge    Janae Bonser Rennis Petty  APRN, [name], [name] [facility] [address] [place], [place] [address] [phone]  If 5PM-7AM, please contact night-coverage.  07/01/2018, 9:59 AM  LOS: 4 days

## 2018-07-02 DIAGNOSIS — K219 Gastro-esophageal reflux disease without esophagitis: Secondary | ICD-10-CM

## 2018-07-02 LAB — CBC
HCT: 18.9 % — ABNORMAL LOW (ref 39.0–52.0)
Hemoglobin: 6.7 g/dL — CL (ref 13.0–17.0)
MCH: 40.9 pg — ABNORMAL HIGH (ref 26.0–34.0)
MCHC: 35.4 g/dL (ref 30.0–36.0)
MCV: 115.2 fL — ABNORMAL HIGH (ref 80.0–100.0)
Platelets: 242 10*3/uL (ref 150–400)
RBC: 1.64 MIL/uL — ABNORMAL LOW (ref 4.22–5.81)
RDW: 20.6 % — ABNORMAL HIGH (ref 11.5–15.5)
WBC: 13.5 10*3/uL — ABNORMAL HIGH (ref 4.0–10.5)
nRBC: 12.1 % — ABNORMAL HIGH (ref 0.0–0.2)

## 2018-07-02 NOTE — Progress Notes (Signed)
Hgb 6.7; called Dr.

## 2018-07-02 NOTE — Progress Notes (Signed)
Patient ID: Martin [name], male   DOB: 12-18-1990, 28 y.o.   MRN: [id number] Subjective: Martin [name], a 28 year old male with a medical history significant for sickle cell disease, HIV on antiretroviral therapy, and chronic pain syndrome was admitted and sickle cell pain crisis.  Today patient is still complaining of pain 8 out of 10 mostly in his lower extremities and lower back.  He denies any fever, headache, dizziness, chest pain, vomiting diarrhea or constipation.  Objective:  Vital signs in last 24 hours:  Vitals:   07/02/18 0839 07/02/18 0936 07/02/18 1153 07/02/18 1313  BP:  103/62  108/70  Pulse:  77  95  Resp: 17 16 16 16   Temp:  98.2 F (36.8 C)  98.5 F (36.9 C)  TempSrc:  Oral  Oral  SpO2: 90% 99% 99% 94%  Weight:      Height:        Intake/Output from previous day:   Intake/Output Summary (Last 24 hours) at 07/02/2018 1459 Last data filed at 07/02/2018 1300 Gross per 24 hour  Intake 290 ml  Output 1050 ml  Net -760 ml    Physical Exam: General: Alert, awake, oriented x3, in no acute distress.  HEENT: [place]/AT PEERL, EOMI Neck: Trachea midline,  no masses, no thyromegal,y no JVD, no carotid bruit OROPHARYNX:  Moist, No exudate/ erythema/lesions.  Heart: Regular rate and rhythm, without murmurs, rubs, gallops, PMI non-displaced, no heaves or thrills on palpation.  Lungs: Clear to auscultation, no wheezing or rhonchi noted. No increased vocal fremitus resonant to percussion  Abdomen: Soft, nontender, nondistended, positive bowel sounds, no masses no hepatosplenomegaly noted..  Neuro: No focal neurological deficits noted cranial nerves II through XII grossly intact. DTRs 2+ bilaterally upper and lower extremities. Strength 5 out of 5 in bilateral upper and lower extremities. Musculoskeletal: No warm swelling or erythema around joints, no spinal tenderness noted. Psychiatric: Patient alert and oriented x3, good insight and cognition, good recent to remote  recall. Lymph node survey: No cervical axillary or inguinal lymphadenopathy noted.  Lab Results:  Basic Metabolic Panel:    Component Value Date/Time   NA 137 06/28/2018 0516   K 3.9 06/28/2018 0516   CL 108 06/28/2018 0516   CO2 24 06/28/2018 0516   BUN 10 06/28/2018 0516   CREATININE 0.56 (L) 06/28/2018 0516   GLUCOSE 96 06/28/2018 0516   CALCIUM 8.7 (L) 06/28/2018 0516   CBC:    Component Value Date/Time   WBC 13.5 (H) 07/02/2018 0319   HGB 6.7 (LL) 07/02/2018 0319   HCT 18.9 (L) 07/02/2018 0319   PLT 242 07/02/2018 0319   MCV 115.2 (H) 07/02/2018 0319   NEUTROABS 7.0 06/27/2018 1902   LYMPHSABS 6.8 (H) 06/27/2018 1902   MONOABS 1.8 (H) 06/27/2018 1902   EOSABS 0.2 06/27/2018 1902   BASOSABS 0.0 06/27/2018 1902    No results found for this or any previous visit (from the past 240 hour(s)).  Studies/Results: No results found.  Medications: Scheduled Meds: . abacavir-dolutegravir-lamiVUDine  1 tablet Oral Daily  . enoxaparin (LOVENOX) injection  40 mg Subcutaneous Q24H  . HYDROmorphone   Intravenous Q4H  . hydroxyurea  2,000 mg Oral QHS  . senna-docusate  1 tablet Oral BID   Continuous Infusions: . diphenhydrAMINE 25 mg (07/01/18 1840)   PRN Meds:.ALPRAZolam, bisacodyl, camphor-menthol, diphenhydrAMINE, diphenhydrAMINE **OR** diphenhydrAMINE, hydrOXYzine, naloxone **AND** sodium chloride flush, ondansetron **OR** ondansetron (ZOFRAN) IV, ondansetron (ZOFRAN) IV, oxyCODONE-acetaminophen **AND** oxyCODONE, polyethylene glycol, polyvinyl alcohol, promethazine  Assessment/Plan: Active Problems:  Sickle cell disease with crisis (HCC)   GERD (gastroesophageal reflux disease)   HIV (human immunodeficiency virus infection) (HCC)   Sickle cell pain crisis (HCC)  1. Hb Sickle Cell Disease with crisis: Continue IVF KVO, continue weight based Dilaudid PCA, IV Toradol 30 mg Q 6 H, Monitor vitals very closely, Re-evaluate pain scale regularly, 2 L of Oxygen by  [place]. 2. Leukocytosis:  3. 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 4. Chronic pain Syndrome: Continue patient's home medications. 5. HIV disease: Stable, continue antiretrovirals therapy as ordered.  Code Status: Full Code Family Communication: N/A Disposition Plan: Not yet ready for discharge  Sakib Noguez  If 7PM-7AM, please contact night-coverage.  07/02/2018, 2:59 PM  LOS: 5 days

## 2018-07-03 LAB — CBC WITH DIFFERENTIAL/PLATELET
Abs Immature Granulocytes: 0.04 10*3/uL (ref 0.00–0.07)
Basophils Absolute: 0.1 10*3/uL (ref 0.0–0.1)
Basophils Relative: 0 %
Eosinophils Absolute: 0.3 10*3/uL (ref 0.0–0.5)
Eosinophils Relative: 2 %
HCT: 18.6 % — ABNORMAL LOW (ref 39.0–52.0)
Hemoglobin: 6.7 g/dL — CL (ref 13.0–17.0)
Immature Granulocytes: 0 %
Lymphocytes Relative: 61 %
Lymphs Abs: 6.9 10*3/uL — ABNORMAL HIGH (ref 0.7–4.0)
MCH: 41.9 pg — ABNORMAL HIGH (ref 26.0–34.0)
MCHC: 36 g/dL (ref 30.0–36.0)
MCV: 116.3 fL — ABNORMAL HIGH (ref 80.0–100.0)
Monocytes Absolute: 1.1 10*3/uL — ABNORMAL HIGH (ref 0.1–1.0)
Monocytes Relative: 9 %
Neutro Abs: 3.2 10*3/uL (ref 1.7–7.7)
Neutrophils Relative %: 28 %
Platelets: 265 10*3/uL (ref 150–400)
RBC: 1.6 MIL/uL — ABNORMAL LOW (ref 4.22–5.81)
RDW: 21.4 % — ABNORMAL HIGH (ref 11.5–15.5)
WBC: 11.5 10*3/uL — ABNORMAL HIGH (ref 4.0–10.5)
nRBC: 16.2 % — ABNORMAL HIGH (ref 0.0–0.2)

## 2018-07-03 LAB — COMPREHENSIVE METABOLIC PANEL
ALT: 28 U/L (ref 0–44)
AST: 52 U/L — ABNORMAL HIGH (ref 15–41)
Albumin: 3.7 g/dL (ref 3.5–5.0)
Alkaline Phosphatase: 45 U/L (ref 38–126)
Anion gap: 9 (ref 5–15)
BUN: 15 mg/dL (ref 6–20)
CO2: 25 mmol/L (ref 22–32)
Calcium: 8.8 mg/dL — ABNORMAL LOW (ref 8.9–10.3)
Chloride: 102 mmol/L (ref 98–111)
Creatinine, Ser: 0.84 mg/dL (ref 0.61–1.24)
GFR calc Af Amer: >60 mL/min (ref >60)
GFR calc non Af Amer: >60 mL/min (ref >60)
Glucose, Bld: 99 mg/dL (ref 70–99)
Potassium: 4 mmol/L (ref 3.5–5.1)
Sodium: 136 mmol/L (ref 135–145)
Total Bilirubin: 6.4 mg/dL — ABNORMAL HIGH (ref 0.3–1.2)
Total Protein: 8.2 g/dL — ABNORMAL HIGH (ref 6.5–8.1)

## 2018-07-03 MED ORDER — FLEET ENEMA 7-19 GM/118ML RE ENEM
1.0000 | ENEMA | Freq: Once | RECTAL | Status: AC
  Administered 2018-07-03: 1 via RECTAL
  Filled 2018-07-03: qty 1

## 2018-07-03 MED ORDER — OXYCODONE-ACETAMINOPHEN 10-325 MG PO TABS: 1.0000 | ORAL_TABLET | Freq: Four times a day (QID) | ORAL | 0 refills | Status: DC | PRN

## 2018-07-03 NOTE — Discharge Instructions (Signed)
Sickle Cell Anemia, Adult  Sickle cell anemia is a condition in which red blood cells have an abnormal "sickle" shape. Red blood cells carry oxygen through the body. Sickle-shaped red blood cells do not live as long as normal red blood cells. They also clump together and block blood from flowing through the blood vessels. This condition prevents the body from getting enough oxygen. Sickle cell anemia causes organ damage and pain. It also increases the risk of infection. What are the causes? This condition is caused by a gene that is passed from parent to child (inherited). Receiving two copies of the gene causes the disease. Receiving one copy causes the "trait," which means that symptoms are milder or not present. What increases the risk? This condition is more likely to develop if your ancestors were from [place], the Mediterranean, [place] or [place], the [place], [place], or the [place]. What are the signs or symptoms? Symptoms of this condition include:  Episodes of pain (crises), especially in the hands and feet, joints, back, chest, or abdomen. The pain can be triggered by: ? An illness, especially if there is dehydration. ? Doing an activity with great effort (overexertion). ? Exposure to extreme temperature changes. ? High altitude.  Fatigue.  Shortness of breath or difficulty breathing.  Dizziness.  Pale skin or yellowed skin (jaundice).  Frequent bacterial infections.  Pain and swelling in the hands and feet (hand-food syndrome).  Prolonged, painful erection of the penis (priapism).  Acute chest syndrome. Symptoms of this include: ? Chest pain. ? Fever. ? Cough. ? Fast breathing.  Stroke.  Decreased activity.  Loss of appetite.  Change in behavior.  Headaches.  Seizures.  Vision changes.  Skin ulcers.  Heart disease.  High blood pressure.  Gallstones.  Liver and kidney problems. How is this diagnosed? This condition is diagnosed with  blood tests that check for the gene that causes this condition. How is this treated? There is no cure for most cases of this condition. Treatment focuses on managing your symptoms and preventing complications of the disease. Your health care provider will work with you to identify the best treatment options for you based on an assessment of your condition. Treatment may include:  Medicines, including: ? Pain medicines. ? Antibiotic medicines for infection. ? Medicines to increase the production of a protein in red blood cells that helps carry oxygen in the body (hemoglobin).  Fluids to treat pain and swelling.  Oxygen to treat acute chest syndrome.  Blood transfusions to treat symptoms such as fatigue, stroke, and acute chest syndrome.  Massage and physical therapy for pain.  Regular tests to monitor your condition, such as blood tests, X-rays, CT scans, MRI scans, ultrasounds, and lung function tests. These should be done every 3-12 months, depending on your age.  Hematopoietic stem cell transplant. This is a procedure to replace abnormal stem cells with healthy stem cells from a donor's bone marrow. Stem cells are cells that can develop into blood cells, and bone marrow is the spongy tissue inside the bones. Follow these instructions at home: Medicines  Take over-the-counter and prescription medicines only as told by your health care provider.  If you were prescribed an antibiotic medicine, take it as told by your health care provider. Do not stop taking the antibiotic even if you start to feel better.  If you develop a fever, do not take medicines to reduce the fever right away. This could cover up another problem. Notify your health care provider. Managing   pain, stiffness, and swelling  Try these methods to help ease your pain: ? Using a heating pad. ? Taking a warm bath. ? Distracting yourself, such as by watching TV. Eating and drinking  Drink enough fluid to keep your urine  clear or pale yellow. Drink more in hot weather and during exercise.  Limit or avoid drinking alcohol.  Eat a balanced and nutritious diet. Eat plenty of fruits, vegetables, whole grains, and lean protein.  Take vitamins and supplements as directed by your health care provider. Traveling  When traveling, keep these with you: ? Your medical information. ? The names of your health care providers. ? Your medicines.  If you have to travel by air, ask about precautions you should take. Activity  Get plenty of rest.  Avoid activities that will lower your oxygen levels, such as exercising vigorously. General instructions  Do not use any products that contain nicotine or tobacco, such as cigarettes and e-cigarettes. They lower blood oxygen levels. If you need help quitting, ask your health care provider.  Consider wearing a medical alert bracelet.  Avoid high altitudes.  Avoid extreme temperatures and extreme temperature changes.  Keep all follow-up visits as told by your health care provider. This is important. Contact a health care provider if:  You develop joint pain.  Your feet or hands swell or have pain.  You have fatigue. Get help right away if:  You have symptoms of infection. These include: ? Fever. ? Chills. ? Extreme tiredness. ? Irritability. ? Poor eating. ? Vomiting.  You feel dizzy or faint.  You have new abdominal pain, especially on the left side near the stomach area.  You develop priapism.  You have numbness in your arms or legs or have trouble moving them.  You have trouble talking.  You develop pain that cannot be controlled with medicine.  You become short of breath.  You have rapid breathing.  You have a persistent cough.  You have pain in your chest.  You develop a severe headache or stiff neck.  You feel bloated without eating or after eating a small amount of food.  Your skin is pale.  You suddenly lose  vision. Summary  Sickle cell anemia is a condition in which red blood cells have an abnormal "sickle" shape. This disease can cause organ damage and chronic pain, and it can raise your risk of infection.  Sickle cell anemia is a genetic disorder.  Treatment focuses on managing your symptoms and preventing complications of the disease.  Get medical help right away if you have any signs of infection, such as a fever. This information is not intended to replace advice given to you by your health care provider. Make sure you discuss any questions you have with your health care provider. Document Released: 07/15/2005 Document Revised: 05/12/2016 Document Reviewed: 05/12/2016 Elsevier Interactive Patient Education  2019 [employer]  

## 2018-07-03 NOTE — Progress Notes (Signed)
MEWS Guidelines - (patients age 29 and over)  Red - At High Risk for Deterioration Yellow - At risk for Deterioration  1. Go to room and assess patient 2. Validate data. Is this patient's baseline? If data confirmed: 3. Is this an acute change? 4. Administer prn meds/treatments as ordered. 5. Note Sepsis score 6. Review goals of care 7. Radiation protection practitioner, RRT nurse and Provider. 8. Ask Provider to come to bedside.  9. Document patient condition/interventions/response. 10. Increase frequency of vital signs and focused assessments to at least q15 minutes x 4, then q30 minutes x2. - If stable, then q1h x3, then q4h x3 and then q8h or dept. routine. - If unstable, contact Provider & RRT nurse. Prepare for possible transfer. 11. Add entry in progress notes using the smart phrase ".MEWS". 1. Go to room and assess patient 2. Validate data. Is this patient's baseline? If data confirmed: 3. Is this an acute change? 4. Administer prn meds/treatments as ordered? 5. Note Sepsis score 6. Review goals of care 7. Radiation protection practitioner and Provider 8. Call RRT nurse as needed. 9. Document patient condition/interventions/response. 10. Increase frequency of vital signs and focused assessments to at least q2h x2. - If stable, then q4h x2 and then q8h or dept. routine. - If unstable, contact Provider & RRT nurse. Prepare for possible transfer. 11. Add entry in progress notes using the smart phrase ".MEWS".  Green - Likely stable [place] - Comfort Care Only  1. Continue routine/ordered monitoring.  2. Review goals of care. 1. Continue routine/ordered monitoring. 2. Review goals of care.   Vs monitored per protocol

## 2018-07-03 NOTE — Progress Notes (Signed)
Pt disconnected himself from 3L O2 and went to the family room. Pt O2 sat was 89 on rm air at the time of disconnection. RN educated pt on the importance and need for O2.  Pt refused to stay in room on O2.

## 2018-07-03 NOTE — Progress Notes (Signed)
Patient states he is ready to go home. Dr. Hyman Hopes informed. Discharge instructions explained to patient. Prescription called in to [facility]. Friends are giving him a ride home.

## 2018-07-03 NOTE — Discharge Summary (Signed)
Physician Discharge Summary  Martin [name] BLASINGAME WAQ:[id number] DOB: [birth year] DOA: 06/27/2018  PCP: Patient, No Pcp Per  Admit date: 06/27/2018  Discharge date: 07/03/2018  Discharge Diagnoses:  Active Problems:   Sickle cell disease with crisis (HCC)   GERD (gastroesophageal reflux disease)   HIV (human immunodeficiency virus infection) (HCC)   Sickle cell pain crisis [facility])   Discharge Condition: Stable  Disposition:  Follow-up Information    [facility]. Schedule an appointment as soon as possible for a visit in 1 day(s).   Specialty:  Internal Medicine Contact information: [address] [name] [address] [phone]       Alecia Lemming, MD .   Specialties:  Hematology, Oncology Contact information: [address] [facility] [place] [address] [phone]          Pt is discharged home in good condition and is to follow up with Patient, No Pcp Per this week to have labs evaluated. Froilan X Csizmadia is instructed to increase activity slowly and balance with rest for the next few days, and use prescribed medication to complete treatment of pain  Diet: Regular Wt Readings from Last 3 Encounters:  07/03/18 72.6 kg  05/29/18 68 kg  05/20/18 68 kg    History of present illness:    Hospital Course:  Patient was admitted for sickle cell pain crisis and managed appropriately with IVF, IV Dilaudid via PCA and IV Toradol, as well as other adjunct therapies per sickle cell pain management protocols.  Patient was discharged home today in a hemodynamically stable condition.   Discharge Exam: Vitals:   07/03/18 1304 07/03/18 1307  BP:    Pulse:    Resp: 15 15  Temp:    SpO2:  92%   Vitals:   07/03/18 0800 07/03/18 0927 07/03/18 1304 07/03/18 1307  BP:  105/65    Pulse:  81    Resp: 11 14 15 15   Temp:  98.3 F (36.8 C)    TempSrc:  Oral    SpO2: 97% 92%  92%  Weight:      Height:        General  appearance : Awake, alert, not in any distress. Speech Clear. Not toxic looking HEENT: Atraumatic and Normocephalic, pupils equally reactive to light and accomodation Neck: Supple, no JVD. No cervical lymphadenopathy.  Chest: Good air entry bilaterally, no added sounds  CVS: S1 S2 regular, no murmurs.  Abdomen: Bowel sounds present, Non tender and not distended with no gaurding, rigidity or rebound. Extremities: B/L Lower Ext shows no edema, both legs are warm to touch Neurology: Awake alert, and oriented X 3, CN II-XII intact, Non focal Skin: No Rash  Discharge Instructions  Discharge Instructions    Diet - low sodium heart healthy   Complete by:  As directed    Increase activity slowly   Complete by:  As directed      Allergies as of 07/03/2018      Reactions   Ketorolac Swelling   Patient reports facial edema and left arm edema after administration.      Medication List    TAKE these medications   ALPRAZolam 1 MG tablet Commonly known as:  XANAX Take 1 mg by mouth 2 (two) times daily as needed for anxiety.   diphenhydrAMINE 25 MG tablet Commonly known as:  BENADRYL Take 25 mg by mouth every 6 (six) hours as needed for allergies.   hydroxyurea 500 MG capsule Commonly  known as:  HYDREA Take 2,000 mg by mouth at bedtime.   ibuprofen 200 MG tablet Commonly known as:  ADVIL,MOTRIN Take 400 mg by mouth every 6 (six) hours as needed for moderate pain.   multivitamin with minerals Tabs tablet Take 1 tablet by mouth daily.   oxyCODONE-acetaminophen 10-325 MG tablet Commonly known as:  PERCOCET Take 1 tablet by mouth every 6 (six) hours as needed for pain.   Triumeq 600-50-300 MG tablet Generic drug:  abacavir-dolutegravir-lamiVUDine Take 1 tablet by mouth daily.       The results of significant diagnostics from this hospitalization (including imaging, microbiology, ancillary and laboratory) are listed below for reference.    Significant Diagnostic Studies: No  results found.  Microbiology: No results found for this or any previous visit (from the past 240 hour(s)).   Labs: Basic Metabolic Panel: Recent Labs  Lab 06/27/18 1902 06/28/18 0516 07/03/18 0458  NA 140 137 136  K 3.8 3.9 4.0  CL 108 108 102  CO2 24 24 25   GLUCOSE 99 96 99  BUN 10 10 15   CREATININE 0.63 0.56* 0.84  CALCIUM 8.9 8.7* 8.8*  MG  --  1.9  --   PHOS  --  5.6*  --    Liver Function Tests: Recent Labs  Lab 06/27/18 1902 06/28/18 0516 07/03/18 0458  AST 40 35 52*  ALT 25 23 28   ALKPHOS 56 52 45  BILITOT 7.7* 5.5* 6.4*  PROT 9.2* 8.3* 8.2*  ALBUMIN 4.5 3.8 3.7   No results for input(s): LIPASE, AMYLASE in the last 168 hours. No results for input(s): AMMONIA in the last 168 hours. CBC: Recent Labs  Lab 06/27/18 1902 06/29/18 1045 07/02/18 0319 07/03/18 0458  WBC 15.8* 10.7* 13.5* 11.5*  NEUTROABS 7.0  --   --  3.2  HGB 9.0* 8.7* 6.7* 6.7*  HCT 26.6* 21.3* 18.9* 18.6*  MCV 119.8* 120.3* 115.2* 116.3*  PLT 322 291 242 265   Cardiac Enzymes: No results for input(s): CKTOTAL, CKMB, CKMBINDEX, TROPONINI in the last 168 hours. BNP: Invalid input(s): POCBNP CBG: No results for input(s): GLUCAP in the last 168 hours.  Time coordinating discharge: 50 minutes  Signed:  Aamari Strawderman  [facility] 07/03/2018, 4:19 PM

## 2018-07-29 DIAGNOSIS — Z21 Asymptomatic human immunodeficiency virus [HIV] infection status: Secondary | ICD-10-CM | POA: Diagnosis not present

## 2018-07-29 DIAGNOSIS — M25559 Pain in unspecified hip: Secondary | ICD-10-CM | POA: Diagnosis present

## 2018-07-29 DIAGNOSIS — Z79899 Other long term (current) drug therapy: Secondary | ICD-10-CM | POA: Diagnosis not present

## 2018-07-29 DIAGNOSIS — D5700 Hb-SS disease with crisis, unspecified: Secondary | ICD-10-CM | POA: Diagnosis not present

## 2018-07-30 ENCOUNTER — Emergency Department (HOSPITAL_COMMUNITY)
Admission: EM | Admit: 2018-07-30 | Discharge: 2018-07-30 | Disposition: A | Discharge status: Home / Self Care | Payer: Medicaid - Out of State | Attending: Emergency Medicine | Admitting: Emergency Medicine

## 2018-07-30 ENCOUNTER — Other Ambulatory Visit: Payer: Self-pay

## 2018-07-30 ENCOUNTER — Encounter (HOSPITAL_COMMUNITY): Payer: Self-pay | Admitting: Emergency Medicine

## 2018-07-30 DIAGNOSIS — D5700 Hb-SS disease with crisis, unspecified: Secondary | ICD-10-CM

## 2018-07-30 LAB — RETICULOCYTES
Immature Retic Fract: 53.6 % — ABNORMAL HIGH (ref 2.3–15.9)
RBC.: 2.22 MIL/uL — ABNORMAL LOW (ref 4.22–5.81)
Retic Count, Absolute: 441.6 10*3/uL — ABNORMAL HIGH (ref 19.0–186.0)
Retic Ct Pct: 19.7 % — ABNORMAL HIGH (ref 0.4–3.1)

## 2018-07-30 LAB — CBC WITH DIFFERENTIAL/PLATELET
Band Neutrophils: 0 %
Basophils Absolute: 0 10*3/uL (ref 0.0–0.1)
Basophils Relative: 0 %
Blasts: 0 %
Eosinophils Absolute: 0.3 10*3/uL (ref 0.0–0.5)
Eosinophils Relative: 2 %
HCT: 24.7 % — ABNORMAL LOW (ref 39.0–52.0)
Hemoglobin: 9 g/dL — ABNORMAL LOW (ref 13.0–17.0)
Lymphocytes Relative: 55 %
Lymphs Abs: 8.3 10*3/uL — ABNORMAL HIGH (ref 0.7–4.0)
MCH: 40.5 pg — ABNORMAL HIGH (ref 26.0–34.0)
MCHC: 36.4 g/dL — ABNORMAL HIGH (ref 30.0–36.0)
MCV: 111.3 fL — ABNORMAL HIGH (ref 80.0–100.0)
Metamyelocytes Relative: 0 %
Monocytes Absolute: 1.4 10*3/uL — ABNORMAL HIGH (ref 0.1–1.0)
Monocytes Relative: 9 %
Myelocytes: 0 %
Neutro Abs: 5.2 10*3/uL (ref 1.7–7.7)
Neutrophils Relative %: 34 %
Other: 0 %
Platelets: 240 10*3/uL (ref 150–400)
Promyelocytes Relative: 0 %
RBC: 2.22 MIL/uL — ABNORMAL LOW (ref 4.22–5.81)
RDW: 21.9 % — ABNORMAL HIGH (ref 11.5–15.5)
WBC: 15.2 10*3/uL — ABNORMAL HIGH (ref 4.0–10.5)
nRBC: 0 /100 WBC
nRBC: 42.2 % — ABNORMAL HIGH (ref 0.0–0.2)

## 2018-07-30 LAB — COMPREHENSIVE METABOLIC PANEL
ALT: 30 U/L (ref 0–44)
AST: 61 U/L — ABNORMAL HIGH (ref 15–41)
Albumin: 4.1 g/dL (ref 3.5–5.0)
Alkaline Phosphatase: 64 U/L (ref 38–126)
Anion gap: 10 (ref 5–15)
BUN: 9 mg/dL (ref 6–20)
CO2: 23 mmol/L (ref 22–32)
Calcium: 9.1 mg/dL (ref 8.9–10.3)
Chloride: 106 mmol/L (ref 98–111)
Creatinine, Ser: 0.92 mg/dL (ref 0.61–1.24)
GFR calc Af Amer: >60 mL/min (ref >60)
GFR calc non Af Amer: >60 mL/min (ref >60)
Glucose, Bld: 96 mg/dL (ref 70–99)
Potassium: 4 mmol/L (ref 3.5–5.1)
Sodium: 139 mmol/L (ref 135–145)
Total Bilirubin: 12 mg/dL — ABNORMAL HIGH (ref 0.3–1.2)
Total Protein: 8.3 g/dL — ABNORMAL HIGH (ref 6.5–8.1)

## 2018-07-30 MED ORDER — HYDROMORPHONE HCL 1 MG/ML IJ SOLN: 1.0000 mg | INTRAMUSCULAR | Status: AC

## 2018-07-30 MED ORDER — HYDROMORPHONE HCL 1 MG/ML IJ SOLN
0.5000 mg | INTRAMUSCULAR | Status: AC
  Administered 2018-07-30: 0.5 mg via INTRAVENOUS
  Filled 2018-07-30: qty 1

## 2018-07-30 MED ORDER — HYDROMORPHONE HCL 1 MG/ML IJ SOLN
1.0000 mg | INTRAMUSCULAR | Status: AC
  Administered 2018-07-30: 1 mg via INTRAVENOUS
  Filled 2018-07-30: qty 1

## 2018-07-30 MED ORDER — SODIUM CHLORIDE 0.9% FLUSH: 3.0000 mL | Freq: Once | INTRAVENOUS | Status: DC

## 2018-07-30 MED ORDER — HYDROMORPHONE HCL 1 MG/ML IJ SOLN: 0.5000 mg | INTRAMUSCULAR | Status: AC

## 2018-07-30 MED ORDER — DIPHENHYDRAMINE HCL 25 MG PO CAPS
50.0000 mg | ORAL_CAPSULE | ORAL | Status: DC | PRN
  Filled 2018-07-30: qty 2

## 2018-07-30 MED ORDER — HYDROMORPHONE HCL 1 MG/ML IJ SOLN
1.0000 mg | INTRAMUSCULAR | Status: AC
  Administered 2018-07-30 (×2): 1 mg via INTRAVENOUS
  Filled 2018-07-30 (×2): qty 1

## 2018-07-30 MED ORDER — ONDANSETRON HCL 4 MG/2ML IJ SOLN: 4.0000 mg | INTRAMUSCULAR | Status: DC | PRN

## 2018-07-30 MED ORDER — DIPHENHYDRAMINE HCL 50 MG/ML IJ SOLN
50.0000 mg | Freq: Once | INTRAMUSCULAR | Status: AC
  Administered 2018-07-30: 50 mg via INTRAVENOUS
  Filled 2018-07-30: qty 1

## 2018-07-30 NOTE — ED Provider Notes (Signed)
[facility] EMERGENCY DEPARTMENT Provider Note   CSN: [id number] Arrival date & time: 07/29/18  2356    History   Chief Complaint Chief Complaint  Patient presents with  . Sickle Cell Pain Crisis    HPI Martin [name] is a 28 y.o. male.     The history is provided by the patient.  Sickle Cell Pain Crisis  Location:  Hip and back Severity:  Severe Onset quality:  Gradual Duration:  1 day Similar to previous crisis episodes: yes   Timing:  Constant Progression:  Worsening Chronicity:  New Relieved by:  Nothing Worsened by:  Nothing Associated symptoms: no chest pain, no cough, no fever, no shortness of breath and no vomiting   PT with History of sickle cell disease, HIV, anxiety presents with sickle cell pain crisis. Reports ongoing back pain and bilateral hip pain over the past day.  Similar to prior episodes.  No fever or cough. He uses Percocet at home, but his pain is not improving.  He has a history of HIV but is compliant with his medications Past Medical History:  Diagnosis Date  . Anxiety   . HIV (human immunodeficiency virus infection) (HCC)   . Sickle cell crisis (HCC)   . Sickle cell disease [facility])     Patient Active Problem List   Diagnosis Date Noted  . Heart murmur 02/03/2017  . Sickle cell disease with crisis (HCC) 02/02/2017  . 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 Sickle cell pain crisis (HCC) 02/02/2017  . 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HCC) 12/20/2016  . Vitamin D deficiency 08/14/2016  . High risk medication use 09/27/2014  . Generalized anxiety disorder 05/19/2014  . GERD (gastroesophageal reflux disease) 05/19/2014  . HIV (human immunodeficiency virus infection) (HCC) 05/23/2012    History reviewed. No pertinent surgical history.      Home Medications    Prior to Admission medications   Medication Sig Start Date End Date Taking? Authorizing Provider  abacavir-dolutegravir-lamiVUDine (TRIUMEQ) 600-50-300 MG tablet  Take 1 tablet by mouth daily.    [provider]  ALPRAZolam Prudy Feeler) 1 MG tablet Take 1 mg by mouth 2 (two) times daily as needed for anxiety.  12/03/16   [provider]  diphenhydrAMINE (BENADRYL) 25 MG tablet Take 25 mg by mouth every 6 (six) hours as needed for allergies.    [provider]  hydroxyurea (HYDREA) 500 MG capsule Take 2,000 mg by mouth at bedtime. 12/28/15   [provider]  ibuprofen (ADVIL,MOTRIN) 200 MG tablet Take 400 mg by mouth every 6 (six) hours as needed for moderate pain.     [provider]  Multiple Vitamin (MULTIVITAMIN WITH MINERALS) TABS tablet Take 1 tablet by mouth daily.    [provider]  oxyCODONE-acetaminophen (PERCOCET) 10-325 MG tablet Take 1 tablet by mouth every 6 (six) hours as needed for pain. 07/03/18   Quentin Angst, MD    Family History Family History  Problem Relation Age of Onset  . Sickle cell trait Mother   . Sickle cell trait Father     Social History Social History   Tobacco Use  . Smoking status: Never Smoker  . Smokeless tobacco: Never Used  Substance Use Topics  . Alcohol use: No  . Drug use: No     Allergies   Ketorolac   Review of Systems Review of Systems  Constitutional: Negative for fever.  Respiratory: Negative for cough and shortness of breath.   Cardiovascular: Negative for chest pain.  Gastrointestinal: Negative for vomiting.  Musculoskeletal: Negative for joint swelling.  All other systems reviewed and are negative.    Physical Exam Updated Vital Signs BP 110/71 (BP Location: Right Arm)   Pulse 79   Temp 98.6 F (37 C) (Oral)   Resp 16   SpO2 90%   Physical Exam CONSTITUTIONAL: Well developed/well nourished, uncomfortable appearing HEAD: Normocephalic/atraumatic EYES: EOMI/PERRL ENMT: Mucous membranes moist NECK: supple no meningeal signs SPINE/BACK:entire spine nontender CV: S1/S2 noted, no murmurs/rubs/gallops noted LUNGS: Lungs are  clear to auscultation bilaterally, no apparent distress ABDOMEN: soft, nontender, no rebound or guarding, bowel sounds noted throughout abdomen GU:no cva tenderness NEURO: Pt is awake/alert/appropriate, moves all extremitiesx4.  No facial droop.   EXTREMITIES: pulses normal/equal, full ROM , no joint effusions SKIN: warm, color normal PSYCH: no abnormalities of mood noted, alert and oriented to situation   ED Treatments / Results  Labs (all labs ordered are listed, but only abnormal results are displayed) Labs Reviewed  COMPREHENSIVE METABOLIC PANEL - Abnormal; Notable for the following components:      Result Value   Total Protein 8.3 (*)    AST 61 (*)    Total Bilirubin 12.0 (*)    All other components within normal limits  CBC WITH DIFFERENTIAL/PLATELET - Abnormal; Notable for the following components:   WBC 15.2 (*)    RBC 2.22 (*)    Hemoglobin 9.0 (*)    HCT 24.7 (*)    MCV 111.3 (*)    MCH 40.5 (*)    MCHC 36.4 (*)    RDW 21.9 (*)    nRBC 42.2 (*)    Lymphs Abs 8.3 (*)    Monocytes Absolute 1.4 (*)    All other components within normal limits  RETICULOCYTES - Abnormal; Notable for the following components:   Retic Ct Pct 19.7 (*)    RBC. 2.22 (*)    Retic Count, Absolute 441.6 (*)    Immature Retic Fract 53.6 (*)    All other components within normal limits    EKG None  Radiology No results found.  Procedures Procedures   Medications Ordered in ED Medications  sodium chloride flush (NS) 0.9 % injection 3 mL (has no administration in time range)  HYDROmorphone (DILAUDID) injection 1 mg (0 mg Intravenous Hold 07/30/18 0359)    Or  HYDROmorphone (DILAUDID) injection 1 mg ( Subcutaneous See Alternative 07/30/18 0359)  HYDROmorphone (DILAUDID) injection 1 mg (1 mg Intravenous Given 07/30/18 0430)    Or  HYDROmorphone (DILAUDID) injection 1 mg ( Subcutaneous See Alternative 07/30/18 0430)  diphenhydrAMINE (BENADRYL) capsule 50 mg (has no administration in time  range)  ondansetron (ZOFRAN) injection 4 mg (has no administration in time range)  HYDROmorphone (DILAUDID) injection 0.5 mg (0.5 mg Intravenous Given 07/30/18 0239)    Or  HYDROmorphone (DILAUDID) injection 0.5 mg ( Subcutaneous See Alternative 07/30/18 0239)  HYDROmorphone (DILAUDID) injection 1 mg (1 mg Intravenous Given 07/30/18 0315)    Or  HYDROmorphone (DILAUDID) injection 1 mg ( Subcutaneous See Alternative 07/30/18 0315)  diphenhydrAMINE (BENADRYL) injection 50 mg (50 mg Intravenous Given 07/30/18 0239)     Initial Impression / Assessment and Plan / ED Course  I have reviewed the triage vital signs and the nursing notes.  Pertinent labs  results that were available during my care of the patient were reviewed by me and considered in my medical decision making (see chart for details).        5:10 AM Patient with known history  of sickle cell disease presents with pain crisis of back pain and hip pain.  He denies any other concerning features. Patient was given multiple rounds of pain medications is now feeling improved.  Patient was eating a sandwich and drinking a Coke.  He feels appropriate for discharge home.  We discussed return precautions   Final Clinical Impressions(s) / ED Diagnoses   Final diagnoses:  Sickle cell pain crisis [facility](HCC)    ED Discharge Orders    None       Zadie RhineWickline, Andray Assefa, MD 07/30/18 [phone]

## 2018-07-30 NOTE — ED Notes (Signed)
Patient given turkey sandwich and coke.  

## 2018-07-30 NOTE — ED Triage Notes (Signed)
C/o sickle cell crisis with back pain that started today.

## 2018-08-02 LAB — PATHOLOGIST SMEAR REVIEW

## 2018-10-16 ENCOUNTER — Encounter (HOSPITAL_COMMUNITY): Payer: Self-pay

## 2018-10-16 ENCOUNTER — Other Ambulatory Visit: Payer: Self-pay

## 2018-10-16 ENCOUNTER — Emergency Department (HOSPITAL_COMMUNITY)
Admission: EM | Admit: 2018-10-16 | Discharge: 2018-10-17 | Disposition: A | Discharge status: Home / Self Care | Payer: Medicaid - Out of State | Attending: Emergency Medicine | Admitting: Emergency Medicine

## 2018-10-16 DIAGNOSIS — D5700 Hb-SS disease with crisis, unspecified: Secondary | ICD-10-CM | POA: Insufficient documentation

## 2018-10-16 DIAGNOSIS — Z79899 Other long term (current) drug therapy: Secondary | ICD-10-CM | POA: Insufficient documentation

## 2018-10-16 DIAGNOSIS — M545 Low back pain: Secondary | ICD-10-CM | POA: Diagnosis present

## 2018-10-16 DIAGNOSIS — Z21 Asymptomatic human immunodeficiency virus [HIV] infection status: Secondary | ICD-10-CM | POA: Diagnosis not present

## 2018-10-16 MED ORDER — SODIUM CHLORIDE 0.9% FLUSH: 3.0000 mL | Freq: Once | INTRAVENOUS | Status: DC

## 2018-10-16 NOTE — ED Triage Notes (Signed)
Pt arrived via POV with complaints of sickle cell pain in the lower back and hips. Pt denies chest pain or shortness of breath. Pt reports taking his home medication today at 1800.

## 2018-10-17 ENCOUNTER — Encounter: Payer: Self-pay | Admitting: Family Medicine

## 2018-10-17 ENCOUNTER — Ambulatory Visit (INDEPENDENT_AMBULATORY_CARE_PROVIDER_SITE_OTHER): Payer: PRIVATE HEALTH INSURANCE | Admitting: Family Medicine

## 2018-10-17 VITALS — BP 110/68 | HR 92 | Temp 98.6°F | Ht 77.0 in | Wt 146.0 lb

## 2018-10-17 DIAGNOSIS — Z21 Asymptomatic human immunodeficiency virus [HIV] infection status: Secondary | ICD-10-CM

## 2018-10-17 DIAGNOSIS — Z7689 Persons encountering health services in other specified circumstances: Secondary | ICD-10-CM

## 2018-10-17 DIAGNOSIS — D571 Sickle-cell disease without crisis: Secondary | ICD-10-CM

## 2018-10-17 DIAGNOSIS — G8929 Other chronic pain: Secondary | ICD-10-CM

## 2018-10-17 DIAGNOSIS — Z09 Encounter for follow-up examination after completed treatment for conditions other than malignant neoplasm: Secondary | ICD-10-CM

## 2018-10-17 DIAGNOSIS — F411 Generalized anxiety disorder: Secondary | ICD-10-CM

## 2018-10-17 LAB — CBC WITH DIFFERENTIAL/PLATELET
Abs Immature Granulocytes: 0.04 10*3/uL (ref 0.00–0.07)
Basophils Absolute: 0 10*3/uL (ref 0.0–0.1)
Basophils Relative: 0 %
Eosinophils Absolute: 0.2 10*3/uL (ref 0.0–0.5)
Eosinophils Relative: 2 %
HCT: 25.2 % — ABNORMAL LOW (ref 39.0–52.0)
Hemoglobin: 8.9 g/dL — ABNORMAL LOW (ref 13.0–17.0)
Immature Granulocytes: 0 %
Lymphocytes Relative: 42 %
Lymphs Abs: 4.4 10*3/uL — ABNORMAL HIGH (ref 0.7–4.0)
MCH: 39.6 pg — ABNORMAL HIGH (ref 26.0–34.0)
MCHC: 35.3 g/dL (ref 30.0–36.0)
MCV: 112 fL — ABNORMAL HIGH (ref 80.0–100.0)
Monocytes Absolute: 1.3 10*3/uL — ABNORMAL HIGH (ref 0.1–1.0)
Monocytes Relative: 13 %
Neutro Abs: 4.5 10*3/uL (ref 1.7–7.7)
Neutrophils Relative %: 43 %
Platelets: 282 10*3/uL (ref 150–400)
RBC: 2.25 MIL/uL — ABNORMAL LOW (ref 4.22–5.81)
RDW: 23.3 % — ABNORMAL HIGH (ref 11.5–15.5)
WBC: 10.4 10*3/uL (ref 4.0–10.5)
nRBC: 14.1 % — ABNORMAL HIGH (ref 0.0–0.2)

## 2018-10-17 LAB — COMPREHENSIVE METABOLIC PANEL
ALT: 44 U/L (ref 0–44)
AST: 76 U/L — ABNORMAL HIGH (ref 15–41)
Albumin: 4 g/dL (ref 3.5–5.0)
Alkaline Phosphatase: 59 U/L (ref 38–126)
Anion gap: 12 (ref 5–15)
BUN: 8 mg/dL (ref 6–20)
CO2: 20 mmol/L — ABNORMAL LOW (ref 22–32)
Calcium: 8.8 mg/dL — ABNORMAL LOW (ref 8.9–10.3)
Chloride: 107 mmol/L (ref 98–111)
Creatinine, Ser: 0.59 mg/dL — ABNORMAL LOW (ref 0.61–1.24)
GFR calc Af Amer: >60 mL/min (ref >60)
GFR calc non Af Amer: >60 mL/min (ref >60)
Glucose, Bld: 101 mg/dL — ABNORMAL HIGH (ref 70–99)
Potassium: 3.9 mmol/L (ref 3.5–5.1)
Sodium: 139 mmol/L (ref 135–145)
Total Bilirubin: 11.6 mg/dL — ABNORMAL HIGH (ref 0.3–1.2)
Total Protein: 8.5 g/dL — ABNORMAL HIGH (ref 6.5–8.1)

## 2018-10-17 LAB — RETICULOCYTES: RBC.: 2.25 MIL/uL — ABNORMAL LOW (ref 4.22–5.81)

## 2018-10-17 LAB — POCT URINALYSIS DIP (MANUAL ENTRY)
Glucose, UA: NEGATIVE mg/dL
Ketones, POC UA: NEGATIVE mg/dL
Leukocytes, UA: NEGATIVE
Nitrite, UA: NEGATIVE
Protein Ur, POC: 30 mg/dL — AB
Spec Grav, UA: 1.02 (ref 1.010–1.025)
Urobilinogen, UA: 4 E.U./dL — AB
pH, UA: 5.5 (ref 5.0–8.0)

## 2018-10-17 MED ORDER — HYDROMORPHONE HCL 1 MG/ML IJ SOLN
1.0000 mg | INTRAMUSCULAR | Status: AC
  Administered 2018-10-17 (×3): 1 mg via INTRAVENOUS
  Filled 2018-10-17 (×3): qty 1

## 2018-10-17 MED ORDER — ONDANSETRON HCL 4 MG/2ML IJ SOLN
4.0000 mg | INTRAMUSCULAR | Status: DC | PRN
  Administered 2018-10-17: 4 mg via INTRAVENOUS
  Filled 2018-10-17: qty 2

## 2018-10-17 MED ORDER — DEXTROSE-NACL 5-0.45 % IV SOLN
INTRAVENOUS | Status: DC
  Administered 2018-10-17: 01:00:00 via INTRAVENOUS

## 2018-10-17 MED ORDER — DIPHENHYDRAMINE HCL 50 MG/ML IJ SOLN
12.5000 mg | Freq: Once | INTRAMUSCULAR | Status: AC
  Administered 2018-10-17: 12.5 mg via INTRAVENOUS
  Filled 2018-10-17: qty 1

## 2018-10-17 MED ORDER — DIPHENHYDRAMINE HCL 25 MG PO CAPS
25.0000 mg | ORAL_CAPSULE | Freq: Four times a day (QID) | ORAL | Status: DC | PRN
  Administered 2018-10-17: 25 mg via ORAL
  Filled 2018-10-17 (×2): qty 1

## 2018-10-17 MED ORDER — HYDROMORPHONE HCL 1 MG/ML IJ SOLN
1.0000 mg | Freq: Once | INTRAMUSCULAR | Status: AC
  Administered 2018-10-17: 04:00:00 1 mg via INTRAVENOUS
  Filled 2018-10-17: qty 1

## 2018-10-17 NOTE — ED Provider Notes (Signed)
[place] [name] DEPT Provider Note   CSN: [id number] Arrival date & time: 10/16/18  2330    History   Chief Complaint Chief Complaint  Patient presents with  . Sickle Cell Pain Crisis    HPI Martin [name] is a 28 y.o. male.     28 year old male with a history of sickle cell disease and HIV presents to the emergency department for complaints of constant, throbbing pain in his low back and hips.  Pain is been present x2 days.  He has been taking his home oxycodone without relief of his pain.  States that his symptoms feel consistent with past sickle cell crisis.  He has not had any fevers, cough, congestion, abdominal pain, nausea, vomiting, chest pain, shortness of breath, urinary symptoms.  Was last admitted for sickle cell pain crisis in April.  The history is provided by the patient. No language interpreter was used.    Past Medical History:  Diagnosis Date  . Anxiety   . HIV (human immunodeficiency virus infection) ([place])   . Sickle cell crisis ([place])   . Sickle cell disease [facility])     Patient Active Problem List   Diagnosis Date Noted  . Heart murmur 02/03/2017  . Sickle cell disease with crisis ([place]) 02/02/2017  . 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 Sickle cell pain crisis ([place]) 02/02/2017  . 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[place]) 12/20/2016  . Vitamin D deficiency 08/14/2016  . High risk medication use 09/27/2014  . Generalized anxiety disorder 05/19/2014  . GERD (gastroesophageal reflux disease) 05/19/2014  . HIV (human immunodeficiency virus infection) ([place]) 05/23/2012    History reviewed. No pertinent surgical history.      Home Medications    Prior to Admission medications   Medication Sig Start Date End Date Taking? Authorizing Provider  abacavir-dolutegravir-lamiVUDine (TRIUMEQ) 600-50-300 MG tablet Take 1 tablet by mouth daily.   Yes [provider]  ALPRAZolam Duanne Moron) 1 MG tablet Take 1 mg by mouth 2 (two) times daily  as needed for anxiety.  12/03/16  Yes [provider]  diphenhydrAMINE (BENADRYL) 25 MG tablet Take 25 mg by mouth every 6 (six) hours as needed for allergies.   Yes [provider]  hydroxyurea (HYDREA) 500 MG capsule Take 2,000 mg by mouth at bedtime. 12/28/15  Yes [provider]  ibuprofen (ADVIL,MOTRIN) 200 MG tablet Take 400 mg by mouth every 6 (six) hours as needed for moderate pain.    Yes [provider]  Multiple Vitamin (MULTIVITAMIN WITH MINERALS) TABS tablet Take 1 tablet by mouth daily.   Yes [provider]  oxyCODONE-acetaminophen (PERCOCET) 10-325 MG tablet Take 1 tablet by mouth every 6 (six) hours as needed for pain. 07/03/18  Yes Tresa Garter, MD    Family History Family History  Problem Relation Age of Onset  . Sickle cell trait Mother   . Sickle cell trait Father     Social History Social History   Tobacco Use  . Smoking status: Never Smoker  . Smokeless tobacco: Never Used  Substance Use Topics  . Alcohol use: No  . Drug use: No     Allergies   Ketorolac   Review of Systems Review of Systems Ten systems reviewed and are negative for acute change, except as noted in the HPI.    Physical Exam Updated Vital Signs BP (!) 105/58   Pulse 77   Temp 99.9 F (37.7 C) (Oral)   Resp 18   Ht 6\' 5"  (1.956 m)  Wt 68 kg   SpO2 95%   BMI 17.79 kg/m   Physical Exam Vitals signs and nursing note reviewed.  Constitutional:      General: He is not in acute distress.    Appearance: He is well-developed. He is not diaphoretic.     Comments: Nontoxic-appearing and in no acute distress  HENT:     Head: Normocephalic and atraumatic.  Eyes:     General: Scleral icterus present.     Conjunctiva/sclera: Conjunctivae normal.  Neck:     Musculoskeletal: Normal range of motion.  Cardiovascular:     Rate and Rhythm: Normal rate and regular rhythm.     Pulses: Normal pulses.  Pulmonary:     Effort: Pulmonary  effort is normal. No respiratory distress.     Breath sounds: No stridor. No wheezing or rhonchi.     Comments: Lungs clear to auscultation bilaterally Abdominal:     General: There is no distension.     Palpations: There is no mass.     Tenderness: There is no abdominal tenderness. There is no guarding.     Comments: Soft, nontender, nondistended abdomen  Musculoskeletal: Normal range of motion.  Skin:    General: Skin is warm and dry.     Coloration: Skin is not pale.     Findings: No erythema or rash.  Neurological:     General: No focal deficit present.     Mental Status: He is alert and oriented to person, place, and time.  Psychiatric:        Behavior: Behavior normal.      ED Treatments / Results  Labs (all labs ordered are listed, but only abnormal results are displayed) Labs Reviewed  COMPREHENSIVE METABOLIC PANEL - Abnormal; Notable for the following components:      Result Value   CO2 20 (*)    Glucose, Bld 101 (*)    Creatinine, Ser 0.59 (*)    Calcium 8.8 (*)    Total Protein 8.5 (*)    AST 76 (*)    Total Bilirubin 11.6 (*)    All other components within normal limits  CBC WITH DIFFERENTIAL/PLATELET - Abnormal; Notable for the following components:   RBC 2.25 (*)    Hemoglobin 8.9 (*)    HCT 25.2 (*)    MCV 112.0 (*)    MCH 39.6 (*)    RDW 23.3 (*)    nRBC 14.1 (*)    Lymphs Abs 4.4 (*)    Monocytes Absolute 1.3 (*)    All other components within normal limits  RETICULOCYTES - Abnormal; Notable for the following components:   RBC. 2.25 (*)    All other components within normal limits    EKG None  Radiology No results found.  Procedures Procedures (including critical care time)  Medications Ordered in ED Medications  sodium chloride flush (NS) 0.9 % injection 3 mL (has no administration in time range)  dextrose 5 %-0.45 % sodium chloride infusion ( Intravenous New Bag/Given 10/17/18 0036)  diphenhydrAMINE (BENADRYL) capsule 25-50 mg (has no  administration in time range)  ondansetron (ZOFRAN) injection 4 mg (4 mg Intravenous Given 10/17/18 0039)  HYDROmorphone (DILAUDID) injection 1 mg (has no administration in time range)  HYDROmorphone (DILAUDID) injection 1 mg (1 mg Intravenous Given 10/17/18 0201)  diphenhydrAMINE (BENADRYL) injection 12.5 mg (12.5 mg Intravenous Given 10/17/18 0042)    3:10 AM Patient reassessed.  States that pain is down to 6/10.  He expresses comfort with discharge and  outpatient follow-up.  Will give additional dose of pain medicine prior to discharge.   Initial Impression / Assessment and Plan / ED Course  I have reviewed the triage vital signs and the nursing notes.  Pertinent labs & imaging results that were available during my care of the patient were reviewed by me and considered in my medical decision making (see chart for details).        28 year old male with a history of sickle cell presents to the ED for complaints of low back and thigh pain consistent with past sickle cell crisis.  He has been hemodynamically stable since arrival.  Labs reassuring without leukocytosis.  Anemia at baseline.  Does have a high total bilirubin, but improved compared to April.  Denies any complaints of abdominal pain, nausea, vomiting.  Pain has improved with 3 doses of pain medication as well as Toradol and fluids.  He is comfortable with following up with his doctors in the office and declines admission for additional pain control.  Return precautions discussed and provided. Patient discharged in stable condition with no unaddressed concerns.   Final Clinical Impressions(s) / ED Diagnoses   Final diagnoses:  Sickle cell pain crisis [facility](HCC)    ED Discharge Orders    None       Antony MaduraHumes, Katlyne Nishida, PA-C 10/17/18 0319    Derwood KaplanNanavati, Ankit, MD 10/17/18 [phone]

## 2018-10-17 NOTE — ED Notes (Signed)
Attempted to call phlebotomy for lab work, no answer will call back in 10 min.

## 2018-10-17 NOTE — Progress Notes (Signed)
Patient [facility]   New Patient--Hospital Follow Up--Establish Care  Subjective:  Patient ID: Martin [name], male    DOB: 1990-08-17  Age: 28 y.o. MRN: [id number]  CC:  Chief Complaint  Patient presents with   Establish Care    HPI Martin [name] is a 28 year old male who presents for Hospital Follow Up and to Establish Care.   Past Medical History:  Diagnosis Date   Anxiety    HIV (human immunodeficiency virus infection) ([place])    Sickle cell crisis ([place])    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[place])    Current Status: Since his last ED visit on 10/16/2018 for Sickle Cell Anemia Crisis. Today, he is doing well with no complaints. He states that he was previously having care for Sickle Cell Anemia at [facility] in [place], [place]. He plans to continue follows with Hematology in [place], [place]. Since his last office visit, he is doing well with no complaints.He states that he has pain in his joints, lower back, hips, and occasional mild chest pain . He rates his pain today at 6/10. He has not had a hospital visit for Sickle Cell Crisis since 10/16/2018 where he was treated and discharged the same day. He is currently taking all medications as prescribed and staying well hydrated. He reports occasional nausea, constipation, dizziness and headaches. His anxiety is moderate today. He denies suicidal ideations, homicidal ideations, or auditory hallucinations. He is currently on prescribed oxygen as needed. He states that he normally receives blood transfusion for Hgb < 7.0. He continues to follow up with Infection Disease for HIV.   He denies fevers, chills, fatigue, recent infections, weight loss, and night sweats. He has not had any visual changes, and falls. No chest pain, heart palpitations, cough and shortness of breath reported. No reports of GI problems such as diarrhea, and constipation. She has no reports of blood in stools, dysuria and  hematuria.   History reviewed. No pertinent surgical history.  Family History  Problem Relation Age of Onset   Sickle cell trait Mother    Sickle cell trait Father     Social History   Socioeconomic History   Marital status: Single    Spouse name: Not on file   Number of children: Not on file   Years of education: Not on file   Highest education level: Not on file  Occupational History   Not on file  Social Needs   Financial resource strain: Not on file   Food insecurity    Worry: Not on file    Inability: Not on file   Transportation needs    Medical: Not on file    Non-medical: Not on file  Tobacco Use   Smoking status: Never Smoker   Smokeless tobacco: Never Used  Substance and Sexual Activity   Alcohol use: No   Drug use: No   Sexual activity: Yes  Lifestyle   Physical activity    Days per week: Not on file    Minutes per session: Not on file   Stress: Not on file  Relationships   Social connections    Talks on phone: Not on file    Gets together: Not on file    Attends religious service: Not on file    Active member of club or organization: Not on file    Attends meetings of clubs or organizations: Not on file    Relationship status: Not on file  Intimate partner violence    Fear of current or ex partner: Not on file    Emotionally abused: Not on file    Physically abused: Not on file    Forced sexual activity: Not on file  Other Topics Concern   Not on file  Social History Narrative   Not on file    Outpatient Medications Prior to Visit  Medication Sig Dispense Refill   abacavir-dolutegravir-lamiVUDine (TRIUMEQ) 600-50-300 MG tablet Take 1 tablet by mouth daily.     ALPRAZolam (XANAX) 1 MG tablet Take 1 mg by mouth 2 (two) times daily as needed for anxiety.   0   diphenhydrAMINE (BENADRYL) 25 MG tablet Take 25 mg by mouth every 6 (six) hours as needed for allergies.     hydroxyurea (HYDREA) 500 MG capsule Take 2,000 mg by  mouth at bedtime.     Multiple Vitamin (MULTIVITAMIN WITH MINERALS) TABS tablet Take 1 tablet by mouth daily.     oxyCODONE-acetaminophen (PERCOCET) 10-325 MG tablet Take 1 tablet by mouth every 6 (six) hours as needed for pain. 30 tablet 0   ibuprofen (ADVIL,MOTRIN) 200 MG tablet Take 400 mg by mouth every 6 (six) hours as needed for moderate pain.      No facility-administered medications prior to visit.     Allergies  Allergen Reactions   Ketorolac Swelling    Patient reports facial edema and left arm edema after administration.    ROS Review of Systems  Constitutional: Negative.   HENT: Negative.   Eyes: Negative.   Respiratory: Negative.   Cardiovascular: Negative.   Gastrointestinal: Negative.   Endocrine: Negative.   Genitourinary: Negative.   Musculoskeletal: Positive for arthralgias (Generalized joint pain).  Skin: Negative.   Allergic/Immunologic: Negative.   Neurological: Negative.   Hematological: Negative.   Psychiatric/Behavioral: Negative.       Objective:    Physical Exam  Constitutional: He is oriented to person, place, and time. He appears well-developed and well-nourished.  HENT:  Head: Normocephalic and atraumatic.  Eyes: Conjunctivae are normal.  Neck: Normal range of motion. Neck supple.  Cardiovascular: Normal rate, regular rhythm, normal heart sounds and intact distal pulses.  Pulmonary/Chest: Effort normal and breath sounds normal.  Abdominal: Soft. Bowel sounds are normal.  Musculoskeletal: Normal range of motion.  Neurological: He is alert and oriented to person, place, and time.  Skin: Skin is warm and dry.  Psychiatric: He has a normal mood and affect. His behavior is normal. Judgment and thought content normal.  Nursing note and vitals reviewed.   BP 110/68 (BP Location: Right Arm, Patient Position: Sitting, Cuff Size: Small)    Pulse 92    Temp 98.6 F (37 C) (Oral)    Ht 6\' 5"  (1.956 m)    Wt 146 lb (66.2 kg)    SpO2 (!) 89%     BMI 17.31 kg/m  Wt Readings from Last 3 Encounters:  10/17/18 146 lb (66.2 kg)  10/16/18 150 lb (68 kg)  07/30/18 150 lb (68 kg)     Health Maintenance Due  Topic Date Due   HIV Screening  01/18/2005   TETANUS/TDAP  01/18/2009    There are no preventive care reminders to display for this patient.  No results found for: TSH Lab Results  Component Value Date   WBC 10.4 10/17/2018   HGB 8.9 (L) 10/17/2018   HCT 25.2 (L) 10/17/2018   MCV 112.0 (H) 10/17/2018   PLT 282 10/17/2018   Lab Results  Component Value  Date   NA 139 10/17/2018   K 3.9 10/17/2018   CO2 20 (L) 10/17/2018   GLUCOSE 101 (H) 10/17/2018   BUN 8 10/17/2018   CREATININE 0.59 (L) 10/17/2018   BILITOT 11.6 (H) 10/17/2018   ALKPHOS 59 10/17/2018   AST 76 (H) 10/17/2018   ALT 44 10/17/2018   PROT 8.5 (H) 10/17/2018   ALBUMIN 4.0 10/17/2018   CALCIUM 8.8 (L) 10/17/2018   ANIONGAP 12 10/17/2018   No results found for: CHOL No results found for: HDL No results found for: LDLCALC No results found for: [name] No results found for: CHOLHDL Lab Results  Component Value Date   HGBA1C (L) 05/22/2009    <4.0 (NOTE) The ADA recommends the following therapeutic goal for glycemic control related to Hgb A1c measurement: Goal of therapy: <6.5 Hgb A1c  Reference: American Diabetes Association: Clinical Practice Recommendations 2010, Diabetes Care, 2010, 33: (Suppl  1).    Assessment & Plan:    1. Hospital discharge follow-up  2. Encounter to establish care - POCT urinalysis dipstick  3. Hb-SS disease without crisis [facility](HCC) He will assess with Hematologist to assess if she wants to continue refilling Percocet or transfer Rx over for us to manage at our clinic. Sick[facility] hours and rules discussed with patient at length today. Pain contract signed today. He is doing well today. He will continue to take pain medications as prescribed; will continue to avoid extreme heat and cold; will continue to eat a  healthy diet and drink at least 64 ounces of water daily; continue stool softener as needed; will avoid colds and flu; will continue to get plenty of sleep and rest; will continue to avoid high stressful situations and remain infection free; will continue Folic Acid 1 mg daily to avoid sickle cell crisis.  - Hemoglobinopathy evaluation - Sickle Cell Panel - POCT Urine Drug Screen  4. Other chronic pain  5. Generalized anxiety disorder Stable today.   6. Asymptomatic HIV infection (HCC) Continue to follow up with Infection Disease.   7. Follow up He will follow up in 2 months.   No orders of the defined types were placed in this encounter.   Orders Placed This Encounter  Procedures   Hemoglobinopathy evaluation   Sickle Cell Panel   POCT urinalysis dipstick   POCT Urine Drug Screen    Referral Orders  No referral(s) requested today    Raliegh IpNatalie Jahlen Bollman,  [name], [name]-BC [facility] [address]  [place], [place] [id number] [phone]  Problem List Items Addressed This Visit      Other   Generalized anxiety disorder   HIV (human immunodeficiency virus infection) (HCC)   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HCC)   Relevant Orders   Hemoglobinopathy evaluation   Sickle Cell Panel   POCT Urine Drug Screen    Other Visit Diagnoses    Hospital discharge follow-up    -  Primary   Encounter to establish care       Relevant Orders   POCT urinalysis dipstick (Completed)   Other chronic pain       Follow up          No orders of the defined types were placed in this encounter.   Follow-up: Return in about 2 months (around 12/17/2018).    Kallie LocksNatalie M Skilynn Durney, [name]

## 2018-10-17 NOTE — Addendum Note (Signed)
Addended by: Azzie Glatter on: 10/17/2018 03:54 PM   Modules accepted: Orders

## 2018-10-17 NOTE — ED Notes (Signed)
Blood return seen when starting IV, but unable to obtain enough for labs through this IV. IV is in place and flushing well without pain or infiltration.

## 2018-10-18 LAB — DRUG SCREEN 764883 11+OXYCO+ALC+CRT-BUND

## 2018-10-19 DIAGNOSIS — E559 Vitamin D deficiency, unspecified: Secondary | ICD-10-CM

## 2018-10-19 HISTORY — DX: Vitamin D deficiency, unspecified: E55.9

## 2018-10-19 LAB — HEMOGLOBINOPATHY EVALUATION
HGB C: 0 %
HGB S: 87.7 % — ABNORMAL HIGH
HGB VARIANT: 0 %
Hemoglobin A2 Quantitation: 4.5 % — ABNORMAL HIGH (ref 1.8–3.2)
Hemoglobin F Quantitation: 7.8 % — ABNORMAL HIGH (ref 0.0–2.0)
Hgb A: 0 % — ABNORMAL LOW (ref 96.4–98.8)

## 2018-10-19 LAB — CMP14+CBC/D/PLT+FER+RETIC+V...
ALT: 45 IU/L — ABNORMAL HIGH (ref 0–44)
AST: 83 IU/L — ABNORMAL HIGH (ref 0–40)
Albumin/Globulin Ratio: 1 — ABNORMAL LOW (ref 1.2–2.2)
Albumin: 4.5 g/dL (ref 4.1–5.2)
Alkaline Phosphatase: 75 IU/L (ref 39–117)
BUN/Creatinine Ratio: 7 — ABNORMAL LOW (ref 9–20)
BUN: 6 mg/dL (ref 6–20)
Basophils Absolute: 0.1 10*3/uL (ref 0.0–0.2)
Basos: 0 %
Bilirubin Total: 13.5 mg/dL (ref 0.0–1.2)
CO2: 18 mmol/L — ABNORMAL LOW (ref 20–29)
Calcium: 9.3 mg/dL (ref 8.7–10.2)
Chloride: 103 mmol/L (ref 96–106)
Creatinine, Ser: 0.87 mg/dL (ref 0.76–1.27)
EOS (ABSOLUTE): 0.2 10*3/uL (ref 0.0–0.4)
Eos: 1 %
Ferritin: 4062 ng/mL — ABNORMAL HIGH (ref 30–400)
GFR calc Af Amer: 136 mL/min/{1.73_m2} (ref >59)
GFR calc non Af Amer: 117 mL/min/{1.73_m2} (ref >59)
Globulin, Total: 4.4 g/dL (ref 1.5–4.5)
Glucose: 81 mg/dL (ref 65–99)
Hematocrit: 26.2 % — ABNORMAL LOW (ref 37.5–51.0)
Hemoglobin: 9.6 g/dL — ABNORMAL LOW (ref 13.0–17.7)
Immature Grans (Abs): 0.1 10*3/uL (ref 0.0–0.1)
Immature Granulocytes: 1 %
Lymphocytes Absolute: 4.6 10*3/uL — ABNORMAL HIGH (ref 0.7–3.1)
Lymphs: 34 %
MCH: 39.5 pg — ABNORMAL HIGH (ref 26.6–33.0)
MCHC: 36.6 g/dL — ABNORMAL HIGH (ref 31.5–35.7)
MCV: 108 fL — ABNORMAL HIGH (ref 79–97)
Monocytes Absolute: 1.4 10*3/uL — ABNORMAL HIGH (ref 0.1–0.9)
Monocytes: 11 %
NRBC: 10 % — ABNORMAL HIGH (ref 0–0)
Neutrophils Absolute: 7 10*3/uL (ref 1.4–7.0)
Neutrophils: 53 %
Platelets: 297 10*3/uL (ref 150–450)
Potassium: 4.5 mmol/L (ref 3.5–5.2)
RBC: 2.43 x10E6/uL — CL (ref 4.14–5.80)
RDW: 20.2 % — ABNORMAL HIGH (ref 11.6–15.4)
Retic Ct Pct: 21.3 % — ABNORMAL HIGH (ref 0.6–2.6)
Sodium: 136 mmol/L (ref 134–144)
Total Protein: 8.9 g/dL — ABNORMAL HIGH (ref 6.0–8.5)
Vit D, 25-Hydroxy: 10.7 ng/mL — ABNORMAL LOW (ref 30.0–100.0)
WBC: 13.3 10*3/uL — ABNORMAL HIGH (ref 3.4–10.8)

## 2018-10-22 LAB — DRUG SCREEN 764883 11+OXYCO+ALC+CRT-BUND
Amphetamines, Urine: NEGATIVE ng/mL
BENZODIAZ UR QL: NEGATIVE ng/mL
Barbiturate: NEGATIVE ng/mL
Cocaine (Metabolite): NEGATIVE ng/mL
Creatinine: 140.6 mg/dL (ref 20.0–300.0)
Ethanol: NEGATIVE %
Meperidine: NEGATIVE ng/mL
Methadone Screen, Urine: NEGATIVE ng/mL
Phencyclidine: NEGATIVE ng/mL
Propoxyphene: NEGATIVE ng/mL
Tramadol: NEGATIVE ng/mL
pH, Urine: 5.6 (ref 4.5–8.9)

## 2018-10-22 LAB — OXYCODONE/OXYMORPHONE, CONFIRM: OXYCODONE/OXYMORPH: NEGATIVE

## 2018-10-22 LAB — CANNABINOID CONFIRMATION, UR
CANNABINOIDS: POSITIVE — AB
Carboxy THC GC/MS Conf: 74 ng/mL

## 2018-10-22 LAB — OPIATES CONFIRMATION, URINE
Codeine: NEGATIVE
Hydrocodone: NEGATIVE
Hydromorphone Confirm: 878 ng/mL
Hydromorphone: POSITIVE — AB
Morphine: NEGATIVE
Opiates: POSITIVE ng/mL — AB

## 2018-11-01 ENCOUNTER — Other Ambulatory Visit: Payer: Self-pay | Admitting: Family Medicine

## 2018-11-01 ENCOUNTER — Encounter: Payer: Self-pay | Admitting: Family Medicine

## 2018-11-01 DIAGNOSIS — E559 Vitamin D deficiency, unspecified: Secondary | ICD-10-CM

## 2018-11-01 MED ORDER — VITAMIN D (ERGOCALCIFEROL) 1.25 MG (50000 UNIT) PO CAPS: 50000.0000 [IU] | ORAL_CAPSULE | ORAL | 3 refills | Status: DC

## 2018-11-02 ENCOUNTER — Telehealth: Payer: Self-pay | Admitting: Family Medicine

## 2018-11-02 NOTE — Telephone Encounter (Signed)
Patient is followed by Hematologist/Oncologist, Dr. Loney Laurence at [facility] in [place], [place]. She is currently managing his pain medications. Spoke to Dr. Wallace Going concerning increased Ferritin and management of opioid medications. We will fax recent lab results to Dr. Larrie Kass office today. We will began pain management and prescribing Opioids here at our office at this time. Patient is aware and verbalized understanding.      Martin Becton,  [name], [name]-BC [facility] [address] [place], [place] [address] [phone]- fax

## 2018-11-15 ENCOUNTER — Telehealth: Payer: Self-pay | Admitting: Family Medicine

## 2018-11-15 ENCOUNTER — Other Ambulatory Visit: Payer: Self-pay | Admitting: Family Medicine

## 2018-11-15 DIAGNOSIS — D571 Sickle-cell disease without crisis: Secondary | ICD-10-CM

## 2018-11-15 DIAGNOSIS — G8929 Other chronic pain: Secondary | ICD-10-CM

## 2018-11-15 MED ORDER — HYDROXYUREA 500 MG PO CAPS: 2000.0000 mg | ORAL_CAPSULE | Freq: Every day | ORAL | 3 refills | Status: DC

## 2018-11-15 MED ORDER — OXYCODONE-ACETAMINOPHEN 10-325 MG PO TABS: 1.0000 | ORAL_TABLET | ORAL | 0 refills | Status: DC | PRN

## 2018-11-15 NOTE — Telephone Encounter (Signed)
Spoke with patient. He states he take Hydrea 500mg  4 tablets by mouth at one does. On his percocet he take 1 tablet every 4 hours as needed for pain. He is scheduled to follow up with you on 12/19/2018. Thanks !

## 2018-11-16 NOTE — Telephone Encounter (Signed)
Tried to contact patient and no answer and vm not set up yet.

## 2018-11-21 NOTE — Telephone Encounter (Signed)
PA has been received and will be filled out and sent back

## 2018-11-22 NOTE — Telephone Encounter (Signed)
PA has been submitted and waiting for approval  

## 2018-11-28 ENCOUNTER — Other Ambulatory Visit: Payer: Self-pay | Admitting: Family Medicine

## 2018-11-28 DIAGNOSIS — G8929 Other chronic pain: Secondary | ICD-10-CM

## 2018-11-28 DIAGNOSIS — D571 Sickle-cell disease without crisis: Secondary | ICD-10-CM

## 2018-11-28 MED ORDER — NALOXONE HCL 0.4 MG/ML IJ SOLN: INTRAMUSCULAR | 2 refills | Status: DC

## 2018-12-01 ENCOUNTER — Encounter (HOSPITAL_COMMUNITY): Payer: Self-pay | Admitting: Emergency Medicine

## 2018-12-01 ENCOUNTER — Other Ambulatory Visit: Payer: Self-pay

## 2018-12-01 ENCOUNTER — Emergency Department (HOSPITAL_COMMUNITY)
Admission: EM | Admit: 2018-12-01 | Discharge: 2018-12-01 | Disposition: A | Discharge status: Home / Self Care | Payer: Medicaid - Out of State | Source: Home / Self Care | Attending: Emergency Medicine | Admitting: Emergency Medicine

## 2018-12-01 DIAGNOSIS — D5700 Hb-SS disease with crisis, unspecified: Principal | ICD-10-CM | POA: Diagnosis present

## 2018-12-01 DIAGNOSIS — F411 Generalized anxiety disorder: Secondary | ICD-10-CM | POA: Diagnosis present

## 2018-12-01 DIAGNOSIS — Z21 Asymptomatic human immunodeficiency virus [HIV] infection status: Secondary | ICD-10-CM | POA: Insufficient documentation

## 2018-12-01 DIAGNOSIS — Z832 Family history of diseases of the blood and blood-forming organs and certain disorders involving the immune mechanism: Secondary | ICD-10-CM

## 2018-12-01 DIAGNOSIS — E559 Vitamin D deficiency, unspecified: Secondary | ICD-10-CM | POA: Diagnosis present

## 2018-12-01 DIAGNOSIS — G894 Chronic pain syndrome: Secondary | ICD-10-CM | POA: Diagnosis present

## 2018-12-01 DIAGNOSIS — D72829 Elevated white blood cell count, unspecified: Secondary | ICD-10-CM | POA: Diagnosis present

## 2018-12-01 DIAGNOSIS — Z20828 Contact with and (suspected) exposure to other viral communicable diseases: Secondary | ICD-10-CM | POA: Diagnosis present

## 2018-12-01 DIAGNOSIS — D638 Anemia in other chronic diseases classified elsewhere: Secondary | ICD-10-CM | POA: Diagnosis present

## 2018-12-01 DIAGNOSIS — D57819 Other sickle-cell disorders with crisis, unspecified: Secondary | ICD-10-CM | POA: Insufficient documentation

## 2018-12-01 DIAGNOSIS — Z79899 Other long term (current) drug therapy: Secondary | ICD-10-CM | POA: Insufficient documentation

## 2018-12-01 DIAGNOSIS — Z888 Allergy status to other drugs, medicaments and biological substances status: Secondary | ICD-10-CM

## 2018-12-01 DIAGNOSIS — Z79891 Long term (current) use of opiate analgesic: Secondary | ICD-10-CM

## 2018-12-01 DIAGNOSIS — B20 Human immunodeficiency virus [HIV] disease: Secondary | ICD-10-CM | POA: Diagnosis present

## 2018-12-01 LAB — COMPREHENSIVE METABOLIC PANEL
ALT: 24 U/L (ref 0–44)
AST: 35 U/L (ref 15–41)
Albumin: 4 g/dL (ref 3.5–5.0)
Alkaline Phosphatase: 63 U/L (ref 38–126)
Anion gap: 7 (ref 5–15)
BUN: 10 mg/dL (ref 6–20)
CO2: 23 mmol/L (ref 22–32)
Calcium: 8.9 mg/dL (ref 8.9–10.3)
Chloride: 108 mmol/L (ref 98–111)
Creatinine, Ser: 0.64 mg/dL (ref 0.61–1.24)
GFR calc Af Amer: >60 mL/min (ref >60)
GFR calc non Af Amer: >60 mL/min (ref >60)
Glucose, Bld: 99 mg/dL (ref 70–99)
Potassium: 4 mmol/L (ref 3.5–5.1)
Sodium: 138 mmol/L (ref 135–145)
Total Bilirubin: 7.6 mg/dL — ABNORMAL HIGH (ref 0.3–1.2)
Total Protein: 9.1 g/dL — ABNORMAL HIGH (ref 6.5–8.1)

## 2018-12-01 LAB — CBC WITH DIFFERENTIAL/PLATELET
Abs Immature Granulocytes: 0.04 10*3/uL (ref 0.00–0.07)
Basophils Absolute: 0.1 10*3/uL (ref 0.0–0.1)
Basophils Relative: 1 %
Eosinophils Absolute: 0.2 10*3/uL (ref 0.0–0.5)
Eosinophils Relative: 2 %
HCT: 23 % — ABNORMAL LOW (ref 39.0–52.0)
Hemoglobin: 8.2 g/dL — ABNORMAL LOW (ref 13.0–17.0)
Immature Granulocytes: 0 %
Lymphocytes Relative: 63 %
Lymphs Abs: 7.9 10*3/uL — ABNORMAL HIGH (ref 0.7–4.0)
MCH: 42.5 pg — ABNORMAL HIGH (ref 26.0–34.0)
MCHC: 35.7 g/dL (ref 30.0–36.0)
MCV: 119.2 fL — ABNORMAL HIGH (ref 80.0–100.0)
Monocytes Absolute: 1.3 10*3/uL — ABNORMAL HIGH (ref 0.1–1.0)
Monocytes Relative: 10 %
Neutro Abs: 3 10*3/uL (ref 1.7–7.7)
Neutrophils Relative %: 24 %
Platelets: 225 10*3/uL (ref 150–400)
RBC: 1.93 MIL/uL — ABNORMAL LOW (ref 4.22–5.81)
RDW: 23.1 % — ABNORMAL HIGH (ref 11.5–15.5)
WBC: 12.6 10*3/uL — ABNORMAL HIGH (ref 4.0–10.5)
nRBC: 2.5 % — ABNORMAL HIGH (ref 0.0–0.2)

## 2018-12-01 LAB — RETICULOCYTES
Immature Retic Fract: 39.8 % — ABNORMAL HIGH (ref 2.3–15.9)
RBC.: 1.93 MIL/uL — ABNORMAL LOW (ref 4.22–5.81)
Retic Count, Absolute: 383.5 10*3/uL — ABNORMAL HIGH (ref 19.0–186.0)
Retic Ct Pct: 19.9 % — ABNORMAL HIGH (ref 0.4–3.1)

## 2018-12-01 MED ORDER — HYDROMORPHONE HCL 2 MG/ML IJ SOLN
2.0000 mg | INTRAMUSCULAR | Status: AC
  Administered 2018-12-01: 2 mg via INTRAVENOUS
  Filled 2018-12-01: qty 1

## 2018-12-01 MED ORDER — DEXTROSE-NACL 5-0.45 % IV SOLN
INTRAVENOUS | Status: DC
  Administered 2018-12-01: 02:00:00 via INTRAVENOUS

## 2018-12-01 MED ORDER — HYDROMORPHONE HCL 2 MG/ML IJ SOLN: 2.0000 mg | INTRAMUSCULAR | Status: AC

## 2018-12-01 MED ORDER — SODIUM CHLORIDE 0.9 % IV BOLUS (SEPSIS)
1000.0000 mL | Freq: Once | INTRAVENOUS | Status: AC
  Administered 2018-12-01: 1000 mL via INTRAVENOUS

## 2018-12-01 MED ORDER — SODIUM CHLORIDE 0.9% FLUSH: 3.0000 mL | Freq: Once | INTRAVENOUS | Status: DC

## 2018-12-01 MED ORDER — ONDANSETRON HCL 4 MG/2ML IJ SOLN
4.0000 mg | INTRAMUSCULAR | Status: DC | PRN
  Administered 2018-12-01: 4 mg via INTRAVENOUS
  Filled 2018-12-01: qty 2

## 2018-12-01 MED ORDER — DIPHENHYDRAMINE HCL 50 MG/ML IJ SOLN
25.0000 mg | Freq: Once | INTRAMUSCULAR | Status: AC
  Administered 2018-12-01: 25 mg via INTRAVENOUS
  Filled 2018-12-01: qty 1

## 2018-12-01 NOTE — ED Triage Notes (Signed)
Patient is complaining of sickle cell crisis. Patient states it started earlier today. Patient took pain medication and it is not working.

## 2018-12-01 NOTE — ED Notes (Signed)
Provided pt with sandwich and soda 

## 2018-12-01 NOTE — ED Provider Notes (Signed)
TIME SEEN: 2:02 AM  CHIEF COMPLAINT: Sickle cell pain  HPI: Patient is a 28 year old male with history of sickle cell disease, HIV with last viral load undetectable and CD4 count of 1,960 in October 2018 who presents to the emergency department today with all over pain that he describes is typical for sickle cell crisis.  Reports compliance with all of his medications.  No fevers, cough, chest pain, shortness of breath, vomiting, diarrhea.  States pain started today.  ROS: See HPI Constitutional: no fever  Eyes: no drainage  ENT: no runny nose   Cardiovascular:  no chest pain  Resp: no SOB  GI: no vomiting GU: no dysuria Integumentary: no rash  Allergy: no hives  Musculoskeletal: no leg swelling  Neurological: no slurred speech ROS otherwise negative  PAST MEDICAL HISTORY/PAST SURGICAL HISTORY:  Past Medical History:  Diagnosis Date  . Anxiety   . HIV (human immunodeficiency virus infection) (HCC)   . Sickle cell crisis (HCC)   . 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HCC)   . Vitamin D deficiency 10/2018    MEDICATIONS:  Prior to Admission medications   Medication Sig Start Date End Date Taking? Authorizing Provider  abacavir-dolutegravir-lamiVUDine (TRIUMEQ) 600-50-300 MG tablet Take 1 tablet by mouth daily.    [provider]  ALPRAZolam Prudy Feeler(XANAX) 1 MG tablet Take 1 mg by mouth 2 (two) times daily as needed for anxiety.  12/03/16   [provider]  diphenhydrAMINE (BENADRYL) 25 MG tablet Take 25 mg by mouth every 6 (six) hours as needed for allergies.    [provider]  hydroxyurea (HYDREA) 500 MG capsule Take 4 capsules (2,000 mg total) by mouth at bedtime. 11/15/18   Kallie LocksStroud, Natalie M, [name]  Multiple Vitamin (MULTIVITAMIN WITH MINERALS) TABS tablet Take 1 tablet by mouth daily.    [provider]  naloxone [facility](NARCAN) 0.4 MG/ML injection As needed. 11/28/18   Kallie LocksStroud, Natalie M, [name]  oxyCODONE-acetaminophen (PERCOCET) 10-325 MG tablet Take 1 tablet by mouth every  4 (four) hours as needed for pain. 11/15/18   Kallie LocksStroud, Natalie M, [name]  Vitamin D, Ergocalciferol, (DRISDOL) 1.25 MG (50000 UT) CAPS capsule Take 1 capsule (50,000 Units total) by mouth every 7 (seven) days. 11/01/18   Kallie LocksStroud, Natalie M, [name]    ALLERGIES:  Allergies  Allergen Reactions  . Ketorolac Swelling    Patient reports facial edema and left arm edema after administration.    SOCIAL HISTORY:  Social History   Tobacco Use  . Smoking status: Never Smoker  . Smokeless tobacco: Never Used  Substance Use Topics  . Alcohol use: No    FAMILY HISTORY: Family History  Problem Relation Age of Onset  . Sickle cell trait Mother   . Sickle cell trait Father     EXAM: BP 101/70 (BP Location: Left Arm)   Pulse 76   Temp 98.5 F (36.9 C) (Oral)   Resp 18   Ht 6\' 3"  (1.905 m)   Wt 68 kg   SpO2 95%   BMI 18.75 kg/m  CONSTITUTIONAL: Alert and oriented and responds appropriately to questions. Well-appearing; well-nourished HEAD: Normocephalic EYES: Conjunctivae clear, pupils appear equal, EOMI ENT: normal nose; moist mucous membranes NECK: Supple, no meningismus, no nuchal rigidity, no LAD  CARD: RRR; S1 and S2 appreciated; no murmurs, no clicks, no rubs, no gallops RESP: Normal chest excursion without splinting or tachypnea; breath sounds clear and equal bilaterally; no wheezes, no rhonchi, no rales, no hypoxia or respiratory distress, speaking full sentences ABD/GI: Normal bowel  sounds; non-distended; soft, non-tender, no rebound, no guarding, no peritoneal signs, no hepatosplenomegaly BACK:  The back appears normal and is non-tender to palpation, there is no CVA tenderness EXT: Normal ROM in all joints; non-tender to palpation; no edema; normal capillary refill; no cyanosis, no calf tenderness or swelling    SKIN: Normal color for age and race; warm; no rash NEURO: Moves all extremities equally PSYCH: The patient's mood and manner are appropriate. Grooming and personal hygiene are  appropriate.  MEDICAL DECISION MAKING: Patient here with complaints of sickle cell pain crisis.  He does not appear significantly uncomfortable here.  No infectious symptoms.  No vomiting.  No chest pain or shortness of breath.  Will give IV fluids, pain medication.  Will obtain labs.  ED PROGRESS: Patient's labs appear to be near his baseline.  He reports pain has been controlled and is significantly improved after 3 rounds of IV Dilaudid.  I feel he is safe for discharge home.  He has an outpatient doctor for follow-up.   At this time, I do not feel there is any life-threatening condition present. I have reviewed and discussed all results (EKG, imaging, lab, urine as appropriate) and exam findings with patient/family. I have reviewed nursing notes and appropriate previous records.  I feel the patient is safe to be discharged home without further emergent workup and can continue workup as an outpatient as needed. Discussed usual and customary return precautions. Patient/family verbalize understanding and are comfortable with this plan.  Outpatient follow-up has been provided as needed. All questions have been answered.      Akeema Broder, Delice Bison, DO 12/01/18 [phone]

## 2018-12-01 NOTE — ED Triage Notes (Signed)
Patient c/o pain all over r/t SCC since last night. Denies chest pain.

## 2018-12-02 ENCOUNTER — Inpatient Hospital Stay (HOSPITAL_COMMUNITY)
Admission: EM | Admit: 2018-12-02 | Discharge: 2018-12-07 | DRG: 812 | Disposition: A | Discharge status: Home / Self Care | Payer: Medicaid - Out of State | Attending: Internal Medicine | Admitting: Internal Medicine

## 2018-12-02 ENCOUNTER — Inpatient Hospital Stay: Payer: Self-pay

## 2018-12-02 ENCOUNTER — Emergency Department: Payer: Self-pay

## 2018-12-02 DIAGNOSIS — Z20828 Contact with and (suspected) exposure to other viral communicable diseases: Secondary | ICD-10-CM | POA: Diagnosis present

## 2018-12-02 DIAGNOSIS — Z21 Asymptomatic human immunodeficiency virus [HIV] infection status: Secondary | ICD-10-CM

## 2018-12-02 DIAGNOSIS — F411 Generalized anxiety disorder: Secondary | ICD-10-CM | POA: Diagnosis present

## 2018-12-02 DIAGNOSIS — Z79899 Other long term (current) drug therapy: Secondary | ICD-10-CM | POA: Diagnosis not present

## 2018-12-02 DIAGNOSIS — B20 Human immunodeficiency virus [HIV] disease: Secondary | ICD-10-CM | POA: Diagnosis present

## 2018-12-02 DIAGNOSIS — Z79891 Long term (current) use of opiate analgesic: Secondary | ICD-10-CM | POA: Diagnosis not present

## 2018-12-02 DIAGNOSIS — E559 Vitamin D deficiency, unspecified: Secondary | ICD-10-CM | POA: Diagnosis present

## 2018-12-02 DIAGNOSIS — G894 Chronic pain syndrome: Secondary | ICD-10-CM | POA: Diagnosis present

## 2018-12-02 DIAGNOSIS — D5700 Hb-SS disease with crisis, unspecified: Principal | ICD-10-CM | POA: Diagnosis present

## 2018-12-02 DIAGNOSIS — Z888 Allergy status to other drugs, medicaments and biological substances status: Secondary | ICD-10-CM | POA: Diagnosis not present

## 2018-12-02 DIAGNOSIS — D638 Anemia in other chronic diseases classified elsewhere: Secondary | ICD-10-CM | POA: Diagnosis present

## 2018-12-02 DIAGNOSIS — D72829 Elevated white blood cell count, unspecified: Secondary | ICD-10-CM | POA: Diagnosis present

## 2018-12-02 DIAGNOSIS — Z832 Family history of diseases of the blood and blood-forming organs and certain disorders involving the immune mechanism: Secondary | ICD-10-CM | POA: Diagnosis not present

## 2018-12-02 LAB — SARS CORONAVIRUS 2 BY RT PCR (HOSPITAL ORDER, PERFORMED IN ~~LOC~~ HOSPITAL LAB): SARS Coronavirus 2: NEGATIVE

## 2018-12-02 MED ORDER — ABACAVIR-DOLUTEGRAVIR-LAMIVUD 600-50-300 MG PO TABS
1.0000 | ORAL_TABLET | Freq: Every day | ORAL | Status: DC
  Administered 2018-12-03 – 2018-12-07 (×5): 1 via ORAL
  Filled 2018-12-02 (×6): qty 1

## 2018-12-02 MED ORDER — HYDROMORPHONE HCL 2 MG/ML IJ SOLN: 2.0000 mg | INTRAMUSCULAR | Status: DC

## 2018-12-02 MED ORDER — HYDROMORPHONE HCL 2 MG/ML IJ SOLN: 2.0000 mg | INTRAMUSCULAR | Status: AC

## 2018-12-02 MED ORDER — SODIUM CHLORIDE 0.9% FLUSH
10.0000 mL | Freq: Two times a day (BID) | INTRAVENOUS | Status: DC
  Administered 2018-12-02 – 2018-12-07 (×7): 10 mL

## 2018-12-02 MED ORDER — ALPRAZOLAM 0.5 MG PO TABS
1.0000 mg | ORAL_TABLET | Freq: Two times a day (BID) | ORAL | Status: DC | PRN
  Administered 2018-12-03 – 2018-12-04 (×4): 1 mg via ORAL
  Filled 2018-12-02 (×3): qty 2
  Filled 2018-12-02: qty 4

## 2018-12-02 MED ORDER — HYDROMORPHONE HCL 2 MG/ML IJ SOLN
2.0000 mg | INTRAMUSCULAR | Status: AC
  Administered 2018-12-02: 2 mg via SUBCUTANEOUS

## 2018-12-02 MED ORDER — SODIUM CHLORIDE 0.9% FLUSH: 9.0000 mL | INTRAVENOUS | Status: DC | PRN

## 2018-12-02 MED ORDER — ONDANSETRON HCL 4 MG/2ML IJ SOLN
4.0000 mg | Freq: Four times a day (QID) | INTRAMUSCULAR | Status: DC | PRN
  Administered 2018-12-02 – 2018-12-07 (×8): 4 mg via INTRAVENOUS
  Filled 2018-12-02 (×8): qty 2

## 2018-12-02 MED ORDER — SENNOSIDES-DOCUSATE SODIUM 8.6-50 MG PO TABS
1.0000 | ORAL_TABLET | Freq: Two times a day (BID) | ORAL | Status: DC
  Administered 2018-12-02 – 2018-12-07 (×7): 1 via ORAL
  Filled 2018-12-02 (×9): qty 1

## 2018-12-02 MED ORDER — SODIUM CHLORIDE 0.45 % IV SOLN
INTRAVENOUS | Status: AC
  Administered 2018-12-02 – 2018-12-03 (×3): via INTRAVENOUS

## 2018-12-02 MED ORDER — POLYETHYLENE GLYCOL 3350 17 G PO PACK: 17.0000 g | PACK | Freq: Every day | ORAL | Status: DC | PRN

## 2018-12-02 MED ORDER — NALOXONE HCL 0.4 MG/ML IJ SOLN: 0.4000 mg | INTRAMUSCULAR | Status: DC | PRN

## 2018-12-02 MED ORDER — DIPHENHYDRAMINE HCL 50 MG/ML IJ SOLN
25.0000 mg | Freq: Once | INTRAMUSCULAR | Status: DC
  Filled 2018-12-02: qty 1

## 2018-12-02 MED ORDER — HYDROMORPHONE HCL 2 MG/ML IJ SOLN
2.0000 mg | Freq: Once | INTRAMUSCULAR | Status: AC
  Administered 2018-12-02: 2 mg via INTRAVENOUS
  Filled 2018-12-02: qty 1

## 2018-12-02 MED ORDER — ENOXAPARIN SODIUM 40 MG/0.4ML ~~LOC~~ SOLN
40.0000 mg | SUBCUTANEOUS | Status: DC
  Filled 2018-12-02 (×5): qty 0.4

## 2018-12-02 MED ORDER — SODIUM CHLORIDE 0.9% FLUSH: 10.0000 mL | INTRAVENOUS | Status: DC | PRN

## 2018-12-02 MED ORDER — DIPHENHYDRAMINE HCL 50 MG/ML IJ SOLN: 25.0000 mg | Freq: Once | INTRAMUSCULAR | Status: DC

## 2018-12-02 MED ORDER — DIPHENHYDRAMINE HCL 50 MG/ML IJ SOLN
25.0000 mg | Freq: Once | INTRAMUSCULAR | Status: AC
  Administered 2018-12-02: 25 mg via INTRAMUSCULAR

## 2018-12-02 MED ORDER — DIPHENHYDRAMINE HCL 25 MG PO CAPS
25.0000 mg | ORAL_CAPSULE | ORAL | Status: DC | PRN
  Administered 2018-12-02 (×2): 25 mg via ORAL
  Filled 2018-12-02 (×2): qty 1

## 2018-12-02 MED ORDER — HYDROMORPHONE HCL 2 MG/ML IJ SOLN
2.0000 mg | INTRAMUSCULAR | Status: AC
  Administered 2018-12-02: 2 mg via SUBCUTANEOUS
  Filled 2018-12-02: qty 1

## 2018-12-02 MED ORDER — ONDANSETRON HCL 4 MG/2ML IJ SOLN
4.0000 mg | INTRAMUSCULAR | Status: DC | PRN
  Filled 2018-12-02: qty 2

## 2018-12-02 MED ORDER — HYDROMORPHONE 1 MG/ML IV SOLN
INTRAVENOUS | Status: DC
  Administered 2018-12-02: 7.1 mg via INTRAVENOUS
  Administered 2018-12-02: 30 mg via INTRAVENOUS
  Administered 2018-12-03 (×2): 0.6 mg via INTRAVENOUS
  Administered 2018-12-03: 9 mg via INTRAVENOUS
  Administered 2018-12-03: 3.4 mg via INTRAVENOUS
  Administered 2018-12-03: 6 mg via INTRAVENOUS
  Administered 2018-12-03: 2.4 mg via INTRAVENOUS
  Administered 2018-12-03: 30 mg via INTRAVENOUS
  Administered 2018-12-04: 4.8 mg via INTRAVENOUS
  Administered 2018-12-04 (×2): 2.4 mg via INTRAVENOUS
  Administered 2018-12-04: 6 mg via INTRAVENOUS
  Administered 2018-12-04: 4.8 mg via INTRAVENOUS
  Administered 2018-12-04: 4.2 mg via INTRAVENOUS
  Administered 2018-12-04: 4.8 mg via INTRAVENOUS
  Administered 2018-12-04: 30 mg via INTRAVENOUS
  Administered 2018-12-05: 2.4 mg via INTRAVENOUS
  Administered 2018-12-05: 6.4 mg via INTRAVENOUS
  Filled 2018-12-02 (×3): qty 30

## 2018-12-02 MED ORDER — SODIUM CHLORIDE 0.45 % IV SOLN: INTRAVENOUS | Status: DC

## 2018-12-02 MED ORDER — HYDROMORPHONE HCL 2 MG/ML IJ SOLN
2.0000 mg | INTRAMUSCULAR | Status: DC
  Filled 2018-12-02: qty 1

## 2018-12-02 MED ORDER — ADULT MULTIVITAMIN W/MINERALS CH
1.0000 | ORAL_TABLET | Freq: Every day | ORAL | Status: DC
  Administered 2018-12-02 – 2018-12-06 (×4): 1 via ORAL
  Filled 2018-12-02 (×5): qty 1

## 2018-12-02 MED ORDER — ONDANSETRON 4 MG PO TBDP: 4.0000 mg | ORAL_TABLET | Freq: Once | ORAL | Status: DC

## 2018-12-02 MED ORDER — HYDROXYUREA 500 MG PO CAPS
2000.0000 mg | ORAL_CAPSULE | Freq: Every day | ORAL | Status: DC
  Administered 2018-12-02 – 2018-12-06 (×5): 2000 mg via ORAL
  Filled 2018-12-02 (×6): qty 4

## 2018-12-02 MED ORDER — ACETAMINOPHEN 325 MG PO TABS
650.0000 mg | ORAL_TABLET | Freq: Four times a day (QID) | ORAL | Status: DC | PRN
  Administered 2018-12-04 (×2): 650 mg via ORAL
  Filled 2018-12-02 (×2): qty 2

## 2018-12-02 NOTE — ED Notes (Signed)
Attempted IV stick x2. Unsuccessful.  

## 2018-12-02 NOTE — ED Provider Notes (Signed)
Care assumed at shift change from [place], [place].  Plan for reevaluation of pain.  In summation history of sickle cell HIV, last viral load undetectable presenting for diffuse pain consistent with his sickle cell crises.  Was seen emergency department 24 hours prior without resolve his pain.  Labs yesterday at baseline.  No repeat labs.  Unable to obtain IV access via multiple tries by nursing and IV team.  He does not have a port.  Patient was given 1 dose of IM Dilaudid.  He refused oral medications.  No concerns for VTE, septic arthritis, acute chest.  Plan for follow-up pain, if unimproved can call for admission or can be transferred to sickle cell clinic.  Initial evaluation current pain a 10/10 described as aching.  First dose of Dilaudid provided no relief.  Previous charts review.  History obtained from patient, past medical records.  No interpretor was used   Physical Exam  BP (!) 99/57   Pulse 80   Temp 98.9 F (37.2 C) (Oral)   Resp 20   Ht 6\' 3"  (1.905 m)   Wt 68 kg   SpO2 94%   BMI 18.75 kg/m   Physical Exam Vitals signs and nursing note reviewed.  Constitutional:      General: He is not in acute distress.    Appearance: He is well-developed. He is not ill-appearing, toxic-appearing or diaphoretic.  HENT:     Head: Atraumatic.     Nose: Nose normal.     Mouth/Throat:     Mouth: Mucous membranes are moist.     Pharynx: Oropharynx is clear.  Eyes:     Pupils: Pupils are equal, round, and reactive to light.  Neck:     Musculoskeletal: Normal range of motion and neck supple.  Cardiovascular:     Rate and Rhythm: Normal rate and regular rhythm.     Pulses: Normal pulses.     Heart sounds: Normal heart sounds.  Pulmonary:     Effort: Pulmonary effort is normal. No respiratory distress.     Breath sounds: Normal breath sounds.  Abdominal:     General: There is no distension.     Palpations: Abdomen is soft.  Musculoskeletal: Normal range of motion.     Comments:  Moves all 4 extremities without difficulty.  Skin:    General: Skin is warm and dry.     Comments: No extremity swelling, redness or warmth.  brisk capillary refill  Neurological:     Mental Status: He is alert.     ED Course/Procedures     .Critical Care Performed by: Nettie Elm, PA-C Authorized by: Nettie Elm, PA-C   Critical care provider statement:    Critical care time (minutes):  31   Critical care was necessary to treat or prevent imminent or life-threatening deterioration of the following conditions:  Circulatory failure (Sickle cell crisis)   Critical care was time spent personally by me on the following activities:  Discussions with consultants, evaluation of patient's response to treatment, examination of patient, ordering and performing treatments and interventions, ordering and review of laboratory studies, ordering and review of radiographic studies, pulse oximetry, re-evaluation of patient's condition, obtaining history from patient or surrogate, review of old charts and discussions with primary provider   Labs Reviewed  SARS CORONAVIRUS 2 ([facility])  CBC  CREATININE, SERUM   MDM   Care assumed at shift change from [place], PA-C.  Plan for reevaluation of pain.  In summation history of sickle cell HIV, last viral load undetectable presenting for diffuse pain consistent with his sickle cell crises.  Was seen emergency department 24 hours prior without resolve his pain.  Labs yesterday at baseline.  Not get repeat labs.  Unable to obtain IV access via multiple tries by nursing and IV team.  He does not have a port.  Patient was given 1 dose of IM Dilaudid.  He refused oral medications.  No concerns for VTE, septic arthritis, acute chest.  Plan for follow-up pain, if unimproved can call for admission or can be transferred to sickle cell clinic.  0730: Continued 10/10. Will give remaining Dilaudid orders per  order set.  0900: Pain still rated a 9/10 doses of pain medicine.  Patient requesting admission for pain management.  Will consult with sickle cell clinic and reevaluate.  1020: Able to get IV access with attending physician, Dr. Anitra LauthPlunkett.  Consulted with Armeniahina Hollis, sickle-cell NP who recommends IV team consult for PICC line, COVID test and she will evaluate patient today for admission. Pain rated a 10/10. Admission orders per accepting team. Currently getting SubQ Dilaudid for pain. PO oral therapy until PICC line placed, patient has been able to tolerate 3 x sodas without difficulty.  1120: IV team consulted for PICC placement.  Labs from 24 hours ago in ER personally reviewed and are baseline.   The patient appears reasonably stabilized for admission considering the current resources, flow, and capabilities available in the ED at this time, and I doubt any other [facility] further screening and/or treatment in the ED prior to admission.      Linwood DibblesHenderly, Shermeka Rutt A, PA-C 12/02/18 1134    Gwyneth SproutPlunkett, [place], MD 12/06/18 [phone]

## 2018-12-02 NOTE — Progress Notes (Signed)
Peripherally Inserted Central Catheter/Midline Placement  The IV Nurse has discussed with the patient and/or persons authorized to consent for the patient, the purpose of this procedure and the potential benefits and risks involved with this procedure.  The benefits include less needle sticks, lab draws from the catheter, and the patient may be discharged home with the catheter. Risks include, but not limited to, infection, bleeding, blood clot (thrombus formation), and puncture of an artery; nerve damage and irregular heartbeat and possibility to perform a PICC exchange if needed/ordered by physician.  Alternatives to this procedure were also discussed.  Bard Power PICC patient education guide, fact sheet on infection prevention and patient information card has been provided to patient /or left at bedside.    PICC/Midline Placement Documentation  PICC Double Lumen 12/02/18 PICC Right Brachial 40 cm 0 cm (Active)  Indication for Insertion or Continuance of Line Poor Vasculature-patient has had multiple peripheral attempts or PIVs lasting less than 24 hours 12/02/18 1400  Exposed Catheter (cm) 0 cm 12/02/18 1400  Site Assessment Clean;Dry;Intact 12/02/18 1400  Lumen #1 Status Flushed;Blood return noted 12/02/18 1400  Lumen #2 Status Flushed;Blood return noted 12/02/18 1400  Dressing Type Transparent 12/02/18 1400  Dressing Status Clean;Dry;Intact;Antimicrobial disc in place 12/02/18 1400  Dressing Change Due 12/09/18 12/02/18 1400       Jule Economy Horton 12/02/2018, 2:09 PM

## 2018-12-02 NOTE — ED Notes (Signed)
PICC team at bedside

## 2018-12-02 NOTE — ED Provider Notes (Signed)
Martin [name] Provider Note   CSN: [id number] Arrival date & time: 12/01/18  2116    History   Chief Complaint Chief Complaint  Patient presents with  . Sickle Cell Pain Crisis    HPI Martin [name] is a 28 y.o. 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  He reports that his symptoms are consistent with typical sickle cell crisis.  He reports that he has been compliant with his home medications, including his home 10 mg of Percocet.  Last dose was approximately 2 hours prior to arrival in the ER.  He denies chest pain, shortness of breath, nausea, vomiting, diarrhea, numbness, weakness.   He does not have a port.  He reports that [facility] [place] but [facility]     The history is provided by the patient. No language interpreter was used.    Past Medical History:  Diagnosis Date  . Anxiety   . HIV (human immunodeficiency virus infection) (HCC)   . Sickle cell crisis (HCC)   . 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HCC)   . Vitamin D deficiency 10/2018    Patient Active Problem List   Diagnosis Date Noted  . Heart murmur 02/03/2017  . Sickle cell disease with crisis (HCC) 02/02/2017  . 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 Sickle cell pain crisis (HCC) 02/02/2017  . 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HCC) 12/20/2016  . Vitamin D deficiency 08/14/2016  . High risk medication use 09/27/2014  . Generalized anxiety disorder 05/19/2014  . GERD (gastroesophageal reflux disease) 05/19/2014  . HIV (human immunodeficiency virus infection) (HCC) 05/23/2012    History reviewed. No pertinent surgical history.      Home Medications    Prior to Admission medications   Medication Sig Start Date End Date Taking? Authorizing  Provider  abacavir-dolutegravir-lamiVUDine (TRIUMEQ) 600-50-300 MG tablet Take 1 tablet by mouth daily.   Yes [provider]  ALPRAZolam Prudy Feeler(XANAX) 1 MG tablet Take 1 mg by mouth 2 (two) times daily as needed for anxiety.  12/03/16  Yes [provider]  diphenhydrAMINE (BENADRYL) 25 MG tablet Take 25 mg by mouth every 6 (six) hours as needed for allergies.   Yes [provider]  hydroxyurea (HYDREA) 500 MG capsule Take 4 capsules (2,000 mg total) by mouth at bedtime. 11/15/18  Yes Kallie LocksStroud, Natalie M, [name]  Multiple Vitamin (MULTIVITAMIN WITH MINERALS) TABS tablet Take 1 tablet by mouth daily.   Yes [provider]  oxyCODONE-acetaminophen (PERCOCET) 10-325 MG tablet Take 1 tablet by mouth every 4 (four) hours as needed for pain. 11/15/18  Yes Kallie LocksStroud, Natalie M, [name]  Vitamin D, Ergocalciferol, (DRISDOL) 1.25 MG (50000 UT) CAPS capsule Take 1 capsule (50,000 Units total) by mouth every 7 (seven) days. 11/01/18  Yes Kallie LocksStroud, Natalie M, [name]  naloxone [facility](NARCAN) 0.4 MG/ML injection As needed. 11/28/18   Kallie LocksStroud, Natalie M, [name]    Family History Family History  Problem Relation Age of Onset  . Sickle cell trait Mother   . Sickle cell trait Father     Social History Social History   Tobacco Use  . Smoking status: Never Smoker  . Smokeless tobacco: Never Used  Substance Use Topics  . Alcohol use: No  . Drug use: No     Allergies  Ketorolac   Review of Systems Review of Systems  Constitutional: Negative for appetite change, chills and fever.  Respiratory: Negative for shortness of breath.   Cardiovascular: Negative for chest pain.  Gastrointestinal: Negative for abdominal pain, diarrhea, nausea and vomiting.  Genitourinary: Negative for dysuria.  Musculoskeletal: Positive for arthralgias and myalgias. Negative for back pain.  Skin: Negative for rash.  Allergic/Immunologic: Negative for immunocompromised state.  Neurological: Negative for dizziness, seizures,  weakness, numbness and headaches.  Psychiatric/Behavioral: Negative for confusion.   Physical Exam Updated Vital Signs BP 115/76   Pulse 81   Temp 98.9 F (37.2 C) (Oral)   Resp 20   Ht 6\' 3"  (1.905 m)   Wt 68 kg   SpO2 96%   BMI 18.75 kg/m   Physical Exam Vitals signs and nursing note reviewed.  Constitutional:      General: He is not in acute distress.    Appearance: He is well-developed. He is not ill-appearing, toxic-appearing or diaphoretic.  HENT:     Head: Normocephalic.  Eyes:     General: No scleral icterus.    Conjunctiva/sclera: Conjunctivae normal.  Neck:     Musculoskeletal: Neck supple.  Cardiovascular:     Rate and Rhythm: Normal rate and regular rhythm.     Pulses: Normal pulses.     Heart sounds: Normal heart sounds. No murmur. No friction rub. No gallop.   Pulmonary:     Effort: Pulmonary effort is normal. No respiratory distress.     Breath sounds: No stridor. No wheezing, rhonchi or rales.  Chest:     Chest wall: No tenderness.  Abdominal:     General: There is no distension.     Palpations: Abdomen is soft. There is no mass.     Tenderness: There is no abdominal tenderness. There is no right CVA tenderness, left CVA tenderness, guarding or rebound.     Hernia: No hernia is present.  Musculoskeletal:     Comments: Normal range of motion of all joints without tenderness to palpation. NVI  Skin:    General: Skin is warm and dry.  Neurological:     Mental Status: He is alert.  Psychiatric:        Behavior: Behavior normal.      ED Treatments / Results  Labs (all labs ordered are listed, but only abnormal results are displayed) Labs Reviewed - No data to display  EKG None  Radiology No results found.  Procedures Procedures (including critical care time)  Medications Ordered in ED Medications  HYDROmorphone (DILAUDID) injection 2 mg (has no administration in time range)    Or  HYDROmorphone (DILAUDID) injection 2 mg (has no  administration in time range)  HYDROmorphone (DILAUDID) injection 2 mg (has no administration in time range)    Or  HYDROmorphone (DILAUDID) injection 2 mg (has no administration in time range)  diphenhydrAMINE (BENADRYL) injection 25 mg (25 mg Intravenous Not Given 12/02/18 0708)  ondansetron (ZOFRAN-ODT) disintegrating tablet 4 mg (4 mg Oral Refused 12/02/18 0708)  HYDROmorphone (DILAUDID) injection 2 mg ( Intravenous See Alternative 12/02/18 0630)    Or  HYDROmorphone (DILAUDID) injection 2 mg (2 mg Subcutaneous Given 12/02/18 0630)  diphenhydrAMINE (BENADRYL) injection 25 mg (25 mg Intramuscular Given 12/02/18 0630)     Initial Impression / Assessment and Plan / ED Course  I have reviewed the triage vital signs and the nursing notes.  Pertinent labs & imaging results that were available during my care of the patient were reviewed by  me and considered in my medical decision making (see chart for details).        28 year old male presenting for a second ER visit in the last 24 hours with complaints of sickle cell pain crisis.  He does not appear to be in any acute distress.  He has no infectious symptoms or complaints of chest pain or shortness of breath.  Low suspicion for COVID-19.  Notified by nursing staff that after multiple attempts by both ER staff and IV consult, that they were unable to establish a line with the patient.  He does not currently have a port.  Patient's labs from 24 hours ago in the ER were reviewed and appear to be near his baseline.  Will order subcutaneous Dilaudid x3, oral Zofran, and IM Benadryl and plan to reassess.  He is established with hematology for follow-up.  Patient care transferred to PA Henderly at the end of my shift. Patient presentation, ED course, and plan of care discussed with review of all pertinent labs and imaging. Please see his/her note for further details regarding further ED course and disposition.   Final Clinical Impressions(s) / ED  Diagnoses   Final diagnoses:  Sickle cell pain crisis [facility](HCC)    ED Discharge Orders    None       Blandina Renaldo A, PA-C 12/02/18 0739    Ward, Layla MawKristen N, DO 12/02/18 2308

## 2018-12-02 NOTE — ED Notes (Signed)
ED TO INPATIENT HANDOFF REPORT  Name/Age/Gender Martin [name] 28 y.o. male  Code Status    Code Status Orders  (From admission, onward)         Start     Ordered   12/02/18 1102  Full code  Continuous     12/02/18 1101        Code Status History    Date Active Date Inactive Code Status Order ID Comments User Context   06/28/2018 0047 07/03/2018 2204 Full Code [id number]  Therisa DoyneDoutova, Anastassia, MD Inpatient   02/02/2017 0734 02/05/2017 1820 Full Code [id number]  Elwyn ReachWertman, Sara E, PA-C ED   Advance Care Planning Activity      Home/SNF/Other Home  Chief Complaint Sickle Cell Crisis  Level of Care/Admitting Diagnosis ED Disposition    ED Disposition Condition Comment   Admit  [facility] [[id number]]  Level of Care: Med-Surg [16]  Covid Evaluation: Asymptomatic Screening Protocol (No Symptoms)  Diagnosis: Sickle cell pain crisis [facility](HCC) [[id number]]  Admitting Physician: Quentin AngstJEGEDE, OLUGBEMIGA E [[id number]]  Attending Physician: Quentin AngstJEGEDE, OLUGBEMIGA E [[id number]]  Estimated length of stay: past midnight tomorrow  Certification:: I certify this patient will need inpatient services for at least 2 midnights  PT Class (Do Not Modify): Inpatient [101]  PT Acc Code (Do Not Modify): Private [1]       Medical History Past Medical History:  Diagnosis Date  . Anxiety   . HIV (human immunodeficiency virus infection) (HCC)   . Sickle cell crisis (HCC)   . 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HCC)   . Vitamin D deficiency 10/2018    Allergies Allergies  Allergen Reactions  . Ketorolac Swelling    Patient reports facial edema and left arm edema after administration.    IV Location/Drains/Wounds Patient Lines/Drains/Airways Status   Active Line/Drains/Airways    Name:   Placement date:   Placement time:   Site:   Days:   PICC Double Lumen 12/02/18 PICC Right Brachial 40 cm 0 cm   12/02/18    1350     less than 1          Labs/Imaging Results for orders  placed or performed during the hospital encounter of 12/02/18 (from the past 48 hour(s))  SARS Coronavirus 2 [facility](Hospital order, Per[facility]) Nasopharyngeal Nasopharyngeal Swab     Status: None   Collection Time: 12/02/18 11:11 AM   Specimen: Nasopharyngeal Swab  Result Value Ref Range   SARS Coronavirus 2 NEGATIVE NEGATIVE    Comment: (NOTE) If result is NEGATIVE SARS-CoV-2 target nucleic acids are NOT DETECTED. The SARS-CoV-2 RNA is generally detectable in upper and lower  respiratory specimens during the acute phase of infection. The lowest  concentration of SARS-CoV-2 viral copies this assay can detect is 250  copies / mL. A negative result does not preclude SARS-CoV-2 infection  and should not be used as the sole basis for treatment or other  patient management decisions.  A negative result may occur with  improper specimen collection / handling, submission of specimen other  than nasopharyngeal swab, presence of viral mutation(s) within the  areas targeted by this assay, and inadequate number of viral copies  (<250 copies / mL). A negative result must be combined with clinical  observations, patient history, and epidemiological information. If result is POSITIVE SARS-CoV-2 target nucleic acids are DETECTED. The SARS-CoV-2 RNA is generally detectable in upper and lower  respiratory specimens dur ing the acute phase of infection.  Positive  results are indicative of active infection with SARS-CoV-2.  Clinical  correlation with patient history and other diagnostic information is  necessary to determine patient infection status.  Positive results do  not rule out bacterial infection or co-infection with other viruses. If result is PRESUMPTIVE POSTIVE SARS-CoV-2 nucleic acids MAY BE PRESENT.   A presumptive positive result was obtained on the submitted specimen  and confirmed on repeat testing.  While 2019 novel coronavirus  (SARS-CoV-2) nucleic acids may be  present in the submitted sample  additional confirmatory testing may be necessary for epidemiological  and / or clinical management purposes  to differentiate between  SARS-CoV-2 and other Sarbecovirus currently known to infect humans.  If clinically indicated additional testing with an alternate test  methodology [phone]([id number]) is advised. The SARS-CoV-2 RNA is generally  detectable in upper and lower respiratory sp ecimens during the acute  phase of infection. The expected result is Negative. Fact Sheet for Patients:  BoilerBrush.com.cyhttps://www.fda.gov/media/136312/download Fact Sheet for Healthcare Providers: https://pope.com/https://www.fda.gov/media/136313/download This test is not yet approved or cleared by the [place] FDA and has been authorized for detection and/or diagnosis of SARS-CoV-2 by FDA under an Emergency Use Authorization (EUA).  This EUA will remain in effect (meaning this test can be used) for the duration of the COVID-19 declaration under Section 564(b)(1) of the Act, 21 U.S.C. section 360bbb-3(b)(1), unless the authorization is terminated or revoked sooner. Performed at [facility], [address] [place], [place] [address]    [place] Ekg Site Rite  Result Date: 12/02/2018 If Site Rite image not attached, placement could not be confirmed due to current cardiac rhythm.  [place] Ekg Site Rite  Result Date: 12/02/2018 If Site Rite image not attached, placement could not be confirmed due to current cardiac rhythm.   Pending Labs Unresulted Labs (From admission, onward)    Start     Ordered   12/09/18 0500  Creatinine, serum  (enoxaparin (LOVENOX)    CrCl >/= 30 ml/min)  Weekly,   R    Comments: while on enoxaparin therapy    12/02/18 1101   12/03/18 0500  Comprehensive metabolic panel  Tomorrow morning,   R     12/02/18 1101   12/03/18 0500  CBC with Differential/Platelet  Tomorrow morning,   R     12/02/18 1101   12/02/18 1102  CBC  (enoxaparin (LOVENOX)    CrCl >/= 30  ml/min)  Once,   STAT    Comments: Baseline for enoxaparin therapy IF NOT ALREADY DRAWN.  Notify MD if PLT < 100 K.    12/02/18 1101   12/02/18 1102  Creatinine, serum  (enoxaparin (LOVENOX)    CrCl >/= 30 ml/min)  Once,   STAT    Comments: Baseline for enoxaparin therapy IF NOT ALREADY DRAWN.    12/02/18 1101          Vitals/Pain Today's Vitals   12/02/18 0826 12/02/18 0900 12/02/18 1100 12/02/18 1500  BP:  (!) 99/57    Pulse:  80    Resp:      Temp:      TempSrc:      SpO2:  94%    Weight:      Height:      PainSc: 9   9  9      Isolation Precautions Airborne and Contact precautions  Medications Medications  diphenhydrAMINE (BENADRYL) injection 25 mg (25 mg Intravenous Not Given 12/02/18 0708)  ondansetron (ZOFRAN-ODT) disintegrating tablet 4 mg (4 mg Oral Refused  12/02/18 0708)  abacavir-dolutegravir-lamiVUDine (TRIUMEQ) 600-50-300 MG per tablet 1 tablet (has no administration in time range)  hydroxyurea (HYDREA) capsule 2,000 mg (has no administration in time range)  ALPRAZolam (XANAX) tablet 1 mg (has no administration in time range)  multivitamin with minerals tablet 1 tablet (has no administration in time range)  senna-docusate (Senokot-S) tablet 1 tablet (has no administration in time range)  polyethylene glycol (MIRALAX / GLYCOLAX) packet 17 g (has no administration in time range)  enoxaparin (LOVENOX) injection 40 mg (has no administration in time range)  naloxone (NARCAN) injection 0.4 mg (has no administration in time range)    And  sodium chloride flush (NS) 0.9 % injection 9 mL (has no administration in time range)  ondansetron (ZOFRAN) injection 4 mg (has no administration in time range)  diphenhydrAMINE (BENADRYL) capsule 25 mg (has no administration in time range)  HYDROmorphone (DILAUDID) 1 mg/mL PCA injection (has no administration in time range)  acetaminophen (TYLENOL) tablet 650 mg (has no administration in time range)  sodium chloride flush (NS) 0.9  % injection 10-40 mL (has no administration in time range)  sodium chloride flush (NS) 0.9 % injection 10-40 mL (has no administration in time range)  HYDROmorphone (DILAUDID) injection 2 mg (has no administration in time range)  0.45 % sodium chloride infusion (has no administration in time range)  HYDROmorphone (DILAUDID) injection 2 mg ( Intravenous See Alternative 12/02/18 0630)    Or  HYDROmorphone (DILAUDID) injection 2 mg (2 mg Subcutaneous Given 12/02/18 0630)  HYDROmorphone (DILAUDID) injection 2 mg ( Intravenous See Alternative 12/02/18 0751)    Or  HYDROmorphone (DILAUDID) injection 2 mg (2 mg Subcutaneous Given 12/02/18 0751)  HYDROmorphone (DILAUDID) injection 2 mg ( Intravenous See Alternative 12/02/18 0824)    Or  HYDROmorphone (DILAUDID) injection 2 mg (2 mg Subcutaneous Given 12/02/18 0824)  diphenhydrAMINE (BENADRYL) injection 25 mg (25 mg Intramuscular Given 12/02/18 0630)    Mobility walks

## 2018-12-02 NOTE — H&P (Signed)
H&P  Patient Demographics:  Martin [name], is a 28 y.o. male  MRN: [id number]   DOB - 04-Sep-1990  Admit Date - 12/02/2018  Outpatient Primary MD for the patient is Azzie Glatter, [name]  Chief Complaint  Patient presents with  . Sickle Cell Pain Crisis      HPI:   Martin [name]  is a 29 y.o. male 28 year old male with a medical history significant for sickle cell disease, HIV on antiretroviral therapy, and chronic pain syndrome presented to ER complaining of pain primarily to low back and lower extremities.  Patient states that he has been dealing with pain over the past several days.  He states that overnight pain intensity increased to the point where his pain medications were ineffective.  He last had Percocet on last night without sustained relief.  Patient was evaluated in the ER 24 hours prior without complete resolution of his pain.  Pain intensity 9/10 characterized as constant and aching.  Patient states that he has been unable to get comfortable.  He denies fever, chills, sick contacts, or exposure to COVID-19.  He also denies shortness of breath, chest pain, dizziness, dysuria, abdominal pain, nausea, vomiting, or diarrhea.  ER course: WBCs 12.6, hemoglobin 8.2, and total bilirubin 7.6.  Patient afebrile and maintaining oxygen saturation at 100% on RA.  Patient's pain persists despite Dilaudid SQ, Toradol, and patient was unable to get IV fluids due to poor venous access.  PICC line placed in the ER.  Patient admitted to [facility] for further management of sickle cell pain crisis.  Review of systems:  In addition to the HPI above, patient reports Review of Systems  Constitutional: Negative for chills and fever.  HENT: Negative.   Eyes: Negative.   Respiratory: Negative.   Cardiovascular: Negative for chest pain and palpitations.  Gastrointestinal: Negative.   Genitourinary: Negative.   Musculoskeletal: Positive for back pain and joint pain.  Skin: Negative.    Neurological: Negative.   Endo/Heme/Allergies: Negative.   Psychiatric/Behavioral: Negative.    A full 10 point Review of Systems was done, except as stated above, all other Review of Systems were negative.  With Past History of the following :   Past Medical History:  Diagnosis Date  . Anxiety   . HIV (human immunodeficiency virus infection) ([place])   . Sickle cell crisis ([place])   . 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[place])   . Vitamin D deficiency 10/2018      History reviewed. No pertinent surgical history.   Social History:   Social History   Tobacco Use  . Smoking status: Never Smoker  . Smokeless tobacco: Never Used  Substance Use Topics  . Alcohol use: No     Lives - At home   Family History :   Family History  Problem Relation Age of Onset  . Sickle cell trait Mother   . Sickle cell trait Father      Home Medications:   Prior to Admission medications   Medication Sig Start Date End Date Taking? Authorizing Provider  abacavir-dolutegravir-lamiVUDine (TRIUMEQ) 600-50-300 MG tablet Take 1 tablet by mouth daily.   Yes [provider]  ALPRAZolam Duanne Moron) 1 MG tablet Take 1 mg by mouth 2 (two) times daily as needed for anxiety.  12/03/16  Yes [provider]  diphenhydrAMINE (BENADRYL) 25 MG tablet Take 25 mg by mouth every 6 (six) hours as needed for allergies.   Yes [provider]  hydroxyurea (HYDREA) 500 MG capsule Take 4 capsules (2,000 mg  total) by mouth at bedtime. 11/15/18  Yes Kallie LocksStroud, Natalie M, [name]  Multiple Vitamin (MULTIVITAMIN WITH MINERALS) TABS tablet Take 1 tablet by mouth daily.   Yes [provider]  oxyCODONE-acetaminophen (PERCOCET) 10-325 MG tablet Take 1 tablet by mouth every 4 (four) hours as needed for pain. 11/15/18  Yes Kallie LocksStroud, Natalie M, [name]  Vitamin D, Ergocalciferol, (DRISDOL) 1.25 MG (50000 UT) CAPS capsule Take 1 capsule (50,000 Units total) by mouth every 7 (seven) days. 11/01/18  Yes Kallie LocksStroud, Natalie M, [name]  naloxone  [facility](NARCAN) 0.4 MG/ML injection As needed. 11/28/18   Kallie LocksStroud, Natalie M, [name]     Allergies:   Allergies  Allergen Reactions  . Ketorolac Swelling    Patient reports facial edema and left arm edema after administration.     Physical Exam:   Vitals:   Vitals:   12/02/18 0600 12/02/18 0645  BP: (!) 103/57 115/76  Pulse: 91 81  Resp: 20 20  Temp:    SpO2: 99% 96%    Physical Exam: Constitutional: Patient appears well-developed and well-nourished. Not in obvious distress. HENT: Normocephalic, atraumatic, External right and left ear normal. Oropharynx is clear and moist.  Eyes: Conjunctivae and EOM are normal. PERRLA, no scleral icterus. Neck: Normal ROM. Neck supple. No JVD. No tracheal deviation. No thyromegaly. CVS: RRR, S1/S2 +, no murmurs, no gallops, no carotid bruit.  Pulmonary: Effort and breath sounds normal, no stridor, rhonchi, wheezes, rales.  Abdominal: Soft. BS +, no distension, tenderness, rebound or guarding.  Musculoskeletal: Normal range of motion. No edema and no tenderness.  Lymphadenopathy: No lymphadenopathy noted, cervical, inguinal or axillary Neuro: Alert. Normal reflexes, muscle tone coordination. No cranial nerve deficit. Skin: Skin is warm and dry. No rash noted. Not diaphoretic. No erythema. No pallor. Psychiatric: Normal mood and affect. Behavior, judgment, thought content normal.   Data Review:   CBC Recent Labs  Lab 12/01/18 0206  WBC 12.6*  HGB 8.2*  HCT 23.0*  PLT 225  MCV 119.2*  MCH 42.5*  MCHC 35.7  RDW 23.1*  LYMPHSABS 7.9*  MONOABS 1.3*  EOSABS 0.2  BASOSABS 0.1   ------------------------------------------------------------------------------------------------------------------  Chemistries  Recent Labs  Lab 12/01/18 0206  NA 138  K 4.0  CL 108  CO2 23  GLUCOSE 99  BUN 10  CREATININE 0.64  CALCIUM 8.9  AST 35  ALT 24  ALKPHOS 63  BILITOT 7.6*    ------------------------------------------------------------------------------------------------------------------ estimated creatinine clearance is 132.2 mL/min (by C-G formula based on SCr of 0.64 mg/dL). ------------------------------------------------------------------------------------------------------------------ No results for input(s): TSH, T4TOTAL, T3FREE, THYROIDAB in the last 72 hours.  Invalid input(s): FREET3  Coagulation profile No results for input(s): INR, PROTIME in the last 168 hours. ------------------------------------------------------------------------------------------------------------------- No results for input(s): DDIMER in the last 72 hours. -------------------------------------------------------------------------------------------------------------------  Cardiac Enzymes No results for input(s): CKMB, TROPONINI, MYOGLOBIN in the last 168 hours.  Invalid input(s): CK ------------------------------------------------------------------------------------------------------------------ No results found for: BNP  ---------------------------------------------------------------------------------------------------------------  Urinalysis    Component Value Date/Time   COLORURINE YELLOW 02/02/2017 1024   APPEARANCEUR CLEAR 02/02/2017 1024   LABSPEC 1.012 02/02/2017 1024   PHURINE 5.0 02/02/2017 1024   GLUCOSEU NEGATIVE 02/02/2017 1024   HGBUR NEGATIVE 02/02/2017 1024   BILIRUBINUR small (A) 10/17/2018 1339   KETONESUR negative 10/17/2018 1339   KETONESUR NEGATIVE 02/02/2017 1024   PROTEINUR =30 (A) 10/17/2018 1339   PROTEINUR NEGATIVE 02/02/2017 1024   UROBILINOGEN 4.0 (A) 10/17/2018 1339   UROBILINOGEN 4.0 (H) 05/21/2009 1944   NITRITE Negative 10/17/2018 1339   NITRITE NEGATIVE  02/02/2017 1024   LEUKOCYTESUR Negative 10/17/2018 1339    ----------------------------------------------------------------------------------------------------------------    Imaging Results:    No results found.  EKG reviewed by me.   Assessment & Plan:  Active Problems:   Sickle cell disease with crisis (HCC)   Generalized anxiety disorder   HIV (human immunodeficiency virus infection) (HCC)   Sickle cell pain crisis (HCC)   Hb Sickle Cell Disease with crisis: Admit, continue IV fluids 0.45% saline at 100 mL/h.  Saline @ 125 mls/hour, Weight based Dilaudid PCA started within 30 minutes of admission, hold IV Toradol due to allergy, Monitor vitals very closely, Re-evaluate pain scale regularly, 2 L of Oxygen by [place], Patient will be re-evaluated for pain in the context of function and relationship to baseline as care progresses.  Sickle cell anemia: Start folic acid 1 mg daily.  Continue hydroxyurea.  Hemoglobin 8.2, consistent with patient's baseline.  No clinical indication for blood transfusion at this time.  Chronic pain syndrome: Hold Percocet 10-3 25, use PCA Dilaudid as substitute  Leukocytosis: Mild leukocytosis, suspected to be reactive.  No signs of infection or inflammation.  Continue to monitor closely. Repeat CBC in a.m.  Generalized anxiety disorder: Continue alprazolam 1 mg twice daily as needed.  Patient denies suicidal or homicidal ideations  HIV disease: Stable.  According to history, viral load undetectable.  Continue antiviral medications.   DVT Prophylaxis: Subcut Lovenox   AM Labs Ordered, also please review Full Orders  Family Communication: Admission, patient's condition and plan of care including tests being ordered have been discussed with the patient who indicate understanding and agree with the plan and Code Status.  Code Status: Full Code  Consults called: None    Admission status: Inpatient    Time spent in minutes : 50 minutes  Nolon NationsLachina Moore Kaicen Desena  APRN, [name], [name] Patient Care [facility] [address]  [place], [place] [id number] [phone]  12/02/2018 at 10:33 AM

## 2018-12-03 LAB — CBC WITH DIFFERENTIAL/PLATELET
Abs Immature Granulocytes: 0.05 10*3/uL (ref 0.00–0.07)
Basophils Absolute: 0 10*3/uL (ref 0.0–0.1)
Basophils Relative: 0 %
Eosinophils Absolute: 0.3 10*3/uL (ref 0.0–0.5)
Eosinophils Relative: 3 %
HCT: 20.2 % — ABNORMAL LOW (ref 39.0–52.0)
Hemoglobin: 7.4 g/dL — ABNORMAL LOW (ref 13.0–17.0)
Immature Granulocytes: 0 %
Lymphocytes Relative: 57 %
Lymphs Abs: 6.9 10*3/uL — ABNORMAL HIGH (ref 0.7–4.0)
MCH: 42.5 pg — ABNORMAL HIGH (ref 26.0–34.0)
MCHC: 36.6 g/dL — ABNORMAL HIGH (ref 30.0–36.0)
MCV: 116.1 fL — ABNORMAL HIGH (ref 80.0–100.0)
Monocytes Absolute: 1.2 10*3/uL — ABNORMAL HIGH (ref 0.1–1.0)
Monocytes Relative: 10 %
Neutro Abs: 3.6 10*3/uL (ref 1.7–7.7)
Neutrophils Relative %: 30 %
Platelets: 179 10*3/uL (ref 150–400)
RBC: 1.74 MIL/uL — ABNORMAL LOW (ref 4.22–5.81)
RDW: 22.6 % — ABNORMAL HIGH (ref 11.5–15.5)
WBC: 12 10*3/uL — ABNORMAL HIGH (ref 4.0–10.5)
nRBC: 11.2 % — ABNORMAL HIGH (ref 0.0–0.2)

## 2018-12-03 LAB — COMPREHENSIVE METABOLIC PANEL
ALT: 26 U/L (ref 0–44)
AST: 37 U/L (ref 15–41)
Albumin: 3.7 g/dL (ref 3.5–5.0)
Alkaline Phosphatase: 62 U/L (ref 38–126)
Anion gap: 5 (ref 5–15)
BUN: 11 mg/dL (ref 6–20)
CO2: 27 mmol/L (ref 22–32)
Calcium: 8.7 mg/dL — ABNORMAL LOW (ref 8.9–10.3)
Chloride: 106 mmol/L (ref 98–111)
Creatinine, Ser: 0.76 mg/dL (ref 0.61–1.24)
GFR calc Af Amer: >60 mL/min (ref >60)
GFR calc non Af Amer: >60 mL/min (ref >60)
Glucose, Bld: 93 mg/dL (ref 70–99)
Potassium: 3.8 mmol/L (ref 3.5–5.1)
Sodium: 138 mmol/L (ref 135–145)
Total Bilirubin: 7.4 mg/dL — ABNORMAL HIGH (ref 0.3–1.2)
Total Protein: 8.1 g/dL (ref 6.5–8.1)

## 2018-12-03 MED ORDER — DIPHENHYDRAMINE HCL 50 MG/ML IJ SOLN
25.0000 mg | Freq: Once | INTRAMUSCULAR | Status: AC
  Administered 2018-12-03: 25 mg via INTRAVENOUS
  Filled 2018-12-03: qty 1

## 2018-12-03 NOTE — Progress Notes (Signed)
SCC Alert and oriented.

## 2018-12-04 LAB — CBC WITH DIFFERENTIAL/PLATELET
Abs Immature Granulocytes: 0.02 10*3/uL (ref 0.00–0.07)
Basophils Absolute: 0 10*3/uL (ref 0.0–0.1)
Basophils Relative: 0 %
Eosinophils Absolute: 0.3 10*3/uL (ref 0.0–0.5)
Eosinophils Relative: 3 %
HCT: 20.2 % — ABNORMAL LOW (ref 39.0–52.0)
Hemoglobin: 7.4 g/dL — ABNORMAL LOW (ref 13.0–17.0)
Immature Granulocytes: 0 %
Lymphocytes Relative: 64 %
Lymphs Abs: 5.5 10*3/uL — ABNORMAL HIGH (ref 0.7–4.0)
MCH: 41.6 pg — ABNORMAL HIGH (ref 26.0–34.0)
MCHC: 36.6 g/dL — ABNORMAL HIGH (ref 30.0–36.0)
MCV: 113.5 fL — ABNORMAL HIGH (ref 80.0–100.0)
Monocytes Absolute: 0.6 10*3/uL (ref 0.1–1.0)
Monocytes Relative: 7 %
Neutro Abs: 2.3 10*3/uL (ref 1.7–7.7)
Neutrophils Relative %: 26 %
Platelets: 194 10*3/uL (ref 150–400)
RBC: 1.78 MIL/uL — ABNORMAL LOW (ref 4.22–5.81)
RDW: 22.6 % — ABNORMAL HIGH (ref 11.5–15.5)
WBC: 8.8 10*3/uL (ref 4.0–10.5)
nRBC: 0.8 % — ABNORMAL HIGH (ref 0.0–0.2)

## 2018-12-04 LAB — COMPREHENSIVE METABOLIC PANEL
ALT: 25 U/L (ref 0–44)
AST: 37 U/L (ref 15–41)
Albumin: 3.5 g/dL (ref 3.5–5.0)
Alkaline Phosphatase: 55 U/L (ref 38–126)
Anion gap: 6 (ref 5–15)
BUN: 7 mg/dL (ref 6–20)
CO2: 26 mmol/L (ref 22–32)
Calcium: 8.7 mg/dL — ABNORMAL LOW (ref 8.9–10.3)
Chloride: 105 mmol/L (ref 98–111)
Creatinine, Ser: 0.73 mg/dL (ref 0.61–1.24)
GFR calc Af Amer: >60 mL/min (ref >60)
GFR calc non Af Amer: >60 mL/min (ref >60)
Glucose, Bld: 91 mg/dL (ref 70–99)
Potassium: 3.8 mmol/L (ref 3.5–5.1)
Sodium: 137 mmol/L (ref 135–145)
Total Bilirubin: 7.2 mg/dL — ABNORMAL HIGH (ref 0.3–1.2)
Total Protein: 7.9 g/dL (ref 6.5–8.1)

## 2018-12-04 NOTE — Progress Notes (Signed)
Subjective: Patient admitted with sickle cell disease and HIV. Pain now at patient still complaining of 7 out of 10 pain.  Pain is mainly in his back and legs.  His headache and nausea is persistent.  He has taken some Tylenol and felt a little relief.  Patient used 33 mg of the Dilaudid in the last 24 hours.  No Toradol.  Continues on his HIV medications and antianxiety medicines.  Not on any oral agent.  Objective: Vital signs in last 24 hours: Temp:  [97.9 F (36.6 C)-98.7 F (37.1 C)] 98.3 F (36.8 C) (08/16 2034) Pulse Rate:  [82-91] 91 (08/16 2034) Resp:  [10-17] 10 (08/16 2203) BP: (100-121)/(63-77) 121/77 (08/16 2034) SpO2:  [92 %-97 %] 96 % (08/16 2203) Weight:  [71.7 kg] 71.7 kg (08/16 0617) Weight change:  Last BM Date: 12/03/18  Intake/Output from previous day: 08/15 0701 - 08/16 0700 In: 1560 [P.O.:360; I.V.:1200] Out: 2800 [Urine:2800] Intake/Output this shift: No intake/output data recorded.  General appearance: alert, cooperative, appears stated age and no distress Head: Normocephalic, without obvious abnormality, atraumatic Neck: no adenopathy, no carotid bruit, no JVD, supple, symmetrical, trachea midline and thyroid not enlarged, symmetric, no tenderness/mass/nodules Back: symmetric, no curvature. ROM normal. No CVA tenderness. Resp: clear to auscultation bilaterally Cardio: regular rate and rhythm, S1, S2 normal, no murmur, click, rub or gallop GI: soft, non-tender; bowel sounds normal; no masses,  no organomegaly Extremities: extremities normal, atraumatic, no cyanosis or edema Pulses: 2+ and symmetric Skin: Skin color, texture, turgor normal. No rashes or lesions Neurologic: Grossly normal  Lab Results: Recent Labs    12/03/18 0345 12/04/18 1213  WBC 12.0* 8.8  HGB 7.4* 7.4*  HCT 20.2* 20.2*  PLT 179 194   BMET Recent Labs    12/03/18 0345 12/04/18 1213  NA 138 137  K 3.8 3.8  CL 106 105  CO2 27 26  GLUCOSE 93 91  BUN 11 7  CREATININE  0.76 0.73  CALCIUM 8.7* 8.7*    Studies/Results: No results found.  Medications: I have reviewed the patient's current medications.  Assessment/Plan: A 28 yo admitted with Sickle cell disease as well as HIV disease.  #1. Sickle cell disease with crisis: Pain is slightly better on current regimen.  Will continue with Dilaudid PCA and  Long acting medications. No Toradol due to allergy.  #2. Headaches: Symptomatic treatment with Tylenol appears to be is slowly improving.  #3. Leucocytosis: Resolved.  Due to VOC. Continue to monitor  #4. Anemia: H/H stable at 7.4  #5. HIV Diease: Continue home medications  #6. anxiety disorder: Continue alprazolam.   LOS: 2 days   [name],LAWAL 12/04/2018, 11:12 PM

## 2018-12-04 NOTE — Progress Notes (Addendum)
Subjective: Patient admitted with sickle cell disease and HIV. Pain now at 8/10. Has headaches as well as Nausea. No fever, no chills, no Diarrhea. On Dilaudid PCA and used 34 mg in the last 24 hours. Patient has not been out of bed today.  Objective: Vital signs in last 24 hours: Temp:  [97.6 F (36.4 C)-98.7 F (37.1 C)] 98.7 F (37.1 C) (08/15 0617) Pulse Rate:  [82-104] 86 (08/15 0617) Resp:  [10-17] 12 (08/15 0807) BP: (97-131)/(68-82) 102/68 (08/15 0617) SpO2:  [91 %-96 %] 94 % (08/15 0807) Weight:  [71.7 kg] 71.7 kg (08/15 0617) Weight change:  Last BM Date: 12/03/18  Intake/Output from previous day: 08/14 0701 - 08/15 0700 In: 1560 [P.O.:360; I.V.:1200] Out: 2800 [Urine:2800] Intake/Output this shift: Total I/O In: -  Out: 350 [Urine:350]  General appearance: alert, cooperative, appears stated age and no distress Head: Normocephalic, without obvious abnormality, atraumatic Neck: no adenopathy, no carotid bruit, no JVD, supple, symmetrical, trachea midline and thyroid not enlarged, symmetric, no tenderness/mass/nodules Back: symmetric, no curvature. ROM normal. No CVA tenderness. Resp: clear to auscultation bilaterally Cardio: regular rate and rhythm, S1, S2 normal, no murmur, click, rub or gallop GI: soft, non-tender; bowel sounds normal; no masses,  no organomegaly Extremities: extremities normal, atraumatic, no cyanosis or edema Pulses: 2+ and symmetric Skin: Skin color, texture, turgor normal. No rashes or lesions Neurologic: Grossly normal  Lab Results: Recent Labs    12/03/18 0345  WBC 12.0*  HGB 7.4*  HCT 20.2*  PLT 179   BMET Recent Labs    12/03/18 0345  NA 138  K 3.8  CL 106  CO2 27  GLUCOSE 93  BUN 11  CREATININE 0.76  CALCIUM 8.7*    Studies/Results: No results found.  Medications: I have reviewed the patient's current medications.  Assessment/Plan: A 28 yo admitted with Sickle cell disease as well as HIV disease.  #1. Sickle  cell disease with crisis: Will continue with Dilaudid PCA and  Long acting medications. No Toradol due to allergy.  #2. Headaches: Symptomatic treatment with Tylenol  #3. Leucocytosis: Due to VOC. Continue to monitor  #4. Anemia: H/H stable  #5. HIV Diease: Continue home medications   LOS: 1 day   Latwan Luchsinger,LAWAL 12/03/2018, 11:40 AM

## 2018-12-04 NOTE — Progress Notes (Signed)
SCC 

## 2018-12-05 ENCOUNTER — Encounter (HOSPITAL_COMMUNITY): Payer: Self-pay | Admitting: *Deleted

## 2018-12-05 LAB — CBC WITH DIFFERENTIAL/PLATELET
Abs Immature Granulocytes: 0.01 10*3/uL (ref 0.00–0.07)
Basophils Absolute: 0 10*3/uL (ref 0.0–0.1)
Basophils Relative: 0 %
Eosinophils Absolute: 0.3 10*3/uL (ref 0.0–0.5)
Eosinophils Relative: 3 %
HCT: 19.2 % — ABNORMAL LOW (ref 39.0–52.0)
Hemoglobin: 7.1 g/dL — ABNORMAL LOW (ref 13.0–17.0)
Immature Granulocytes: 0 %
Lymphocytes Relative: 46 %
Lymphs Abs: 4.1 10*3/uL — ABNORMAL HIGH (ref 0.7–4.0)
MCH: 41.5 pg — ABNORMAL HIGH (ref 26.0–34.0)
MCHC: 37 g/dL — ABNORMAL HIGH (ref 30.0–36.0)
MCV: 112.3 fL — ABNORMAL HIGH (ref 80.0–100.0)
Monocytes Absolute: 0.6 10*3/uL (ref 0.1–1.0)
Monocytes Relative: 7 %
Neutro Abs: 3.9 10*3/uL (ref 1.7–7.7)
Neutrophils Relative %: 44 %
Platelets: 194 10*3/uL (ref 150–400)
RBC: 1.71 MIL/uL — ABNORMAL LOW (ref 4.22–5.81)
RDW: 23.5 % — ABNORMAL HIGH (ref 11.5–15.5)
WBC: 8.9 10*3/uL (ref 4.0–10.5)
nRBC: 1 % — ABNORMAL HIGH (ref 0.0–0.2)

## 2018-12-05 LAB — COMPREHENSIVE METABOLIC PANEL
ALT: 28 U/L (ref 0–44)
AST: 42 U/L — ABNORMAL HIGH (ref 15–41)
Albumin: 3.7 g/dL (ref 3.5–5.0)
Alkaline Phosphatase: 53 U/L (ref 38–126)
Anion gap: 7 (ref 5–15)
BUN: 7 mg/dL (ref 6–20)
CO2: 29 mmol/L (ref 22–32)
Calcium: 8.9 mg/dL (ref 8.9–10.3)
Chloride: 101 mmol/L (ref 98–111)
Creatinine, Ser: 0.71 mg/dL (ref 0.61–1.24)
GFR calc Af Amer: >60 mL/min (ref >60)
GFR calc non Af Amer: >60 mL/min (ref >60)
Glucose, Bld: 108 mg/dL — ABNORMAL HIGH (ref 70–99)
Potassium: 3.7 mmol/L (ref 3.5–5.1)
Sodium: 137 mmol/L (ref 135–145)
Total Bilirubin: 7.2 mg/dL — ABNORMAL HIGH (ref 0.3–1.2)
Total Protein: 8.4 g/dL — ABNORMAL HIGH (ref 6.5–8.1)

## 2018-12-05 MED ORDER — PROMETHAZINE HCL 25 MG/ML IJ SOLN
12.5000 mg | Freq: Once | INTRAMUSCULAR | Status: AC
  Administered 2018-12-05: 12.5 mg via INTRAVENOUS
  Filled 2018-12-05: qty 1

## 2018-12-05 MED ORDER — OXYCODONE HCL 5 MG PO TABS
10.0000 mg | ORAL_TABLET | ORAL | Status: DC | PRN
  Administered 2018-12-06 – 2018-12-07 (×6): 10 mg via ORAL
  Filled 2018-12-05 (×6): qty 2

## 2018-12-05 MED ORDER — HYDROMORPHONE 1 MG/ML IV SOLN
INTRAVENOUS | Status: DC
  Administered 2018-12-05: 30 mg via INTRAVENOUS
  Administered 2018-12-05: 5.4 mg via INTRAVENOUS
  Administered 2018-12-06: 5.5 mg via INTRAVENOUS
  Administered 2018-12-06: 0 mg via INTRAVENOUS
  Administered 2018-12-06: 5 mg via INTRAVENOUS
  Administered 2018-12-06: 30 mg via INTRAVENOUS
  Administered 2018-12-07: 0 mg via INTRAVENOUS
  Administered 2018-12-07: 10.5 mg via INTRAVENOUS
  Filled 2018-12-05 (×2): qty 30

## 2018-12-05 NOTE — Progress Notes (Signed)
Subjective: Martin [name], a 28 year old male with a medical history significant for sickle cell disease, chronic pain syndrome, opiate dependence, anemia of chronic disease, and history of HIV disease on antiviral therapy was admitted for sickle cell pain crisis.  Patient states that pain is improved minimally overnight despite IV Dilaudid PCA.  Patient has not maximized PCA.  Current pain intensity 7/10 primarily to low back and lower extremities.  Patient denies headache, dizziness, paresthesias, chest pain, shortness of breath, nausea, vomiting, or diarrhea.  Objective:  Vital signs in last 24 hours:  Vitals:   12/05/18 0827 12/05/18 0934 12/05/18 1147 12/05/18 1244  BP: 106/63  (!) 103/59   Pulse: 85  88   Resp: 17 12 18 11   Temp: 98.2 F (36.8 C)  97.8 F (36.6 C)   TempSrc: Oral  Oral   SpO2: 90% 93% (!) 87% 95%  Weight:      Height:        Intake/Output from previous day:   Intake/Output Summary (Last 24 hours) at 12/05/2018 1456 Last data filed at 12/05/2018 1300 Gross per 24 hour  Intake 1064.8 ml  Output 1850 ml  Net -785.2 ml    Physical Exam: General: Alert, awake, oriented x3, in no acute distress.  HEENT: [place]/AT PEERL, EOMI Neck: Trachea midline,  no masses, no thyromegal,y no JVD, no carotid bruit OROPHARYNX:  Moist, No exudate/ erythema/lesions.  Heart: Regular rate and rhythm, without murmurs, rubs, gallops, PMI non-displaced, no heaves or thrills on palpation.  Lungs: Clear to auscultation, no wheezing or rhonchi noted. No increased vocal fremitus resonant to percussion  Abdomen: Soft, nontender, nondistended, positive bowel sounds, no masses no hepatosplenomegaly noted..  Neuro: No focal neurological deficits noted cranial nerves II through XII grossly intact. DTRs 2+ bilaterally upper and lower extremities. Strength 5 out of 5 in bilateral upper and lower extremities. Musculoskeletal: No warm swelling or erythema around joints, no spinal tenderness  noted. Psychiatric: Patient alert and oriented x3, good insight and cognition, good recent to remote recall. Lymph node survey: No cervical axillary or inguinal lymphadenopathy noted.  Lab Results:  Basic Metabolic Panel:    Component Value Date/Time   NA 137 12/05/2018 0838   NA 136 10/17/2018 1433   K 3.7 12/05/2018 0838   CL 101 12/05/2018 0838   CO2 29 12/05/2018 0838   BUN 7 12/05/2018 0838   BUN 6 10/17/2018 1433   CREATININE 0.71 12/05/2018 0838   GLUCOSE 108 (H) 12/05/2018 0838   CALCIUM 8.9 12/05/2018 0838   CBC:    Component Value Date/Time   WBC 8.9 12/05/2018 0838   HGB 7.1 (L) 12/05/2018 0838   HGB 9.6 (L) 10/17/2018 1433   HCT 19.2 (L) 12/05/2018 0838   HCT 26.2 (L) 10/17/2018 1433   PLT 194 12/05/2018 0838   PLT 297 10/17/2018 1433   MCV 112.3 (H) 12/05/2018 0838   MCV 108 (H) 10/17/2018 1433   NEUTROABS 3.9 12/05/2018 0838   NEUTROABS 7.0 10/17/2018 1433   LYMPHSABS 4.1 (H) 12/05/2018 0838   LYMPHSABS 4.6 (H) 10/17/2018 1433   MONOABS 0.6 12/05/2018 0838   EOSABS 0.3 12/05/2018 0838   EOSABS 0.2 10/17/2018 1433   BASOSABS 0.0 12/05/2018 0838   BASOSABS 0.1 10/17/2018 1433    Recent Results (from the past 240 hour(s))  SARS Coronavirus 2 [facility](Hospital order, Performed in [facility] lab) Nasopharyngeal Nasopharyngeal Swab     Status: None   Collection Time: 12/02/18 11:11 AM   Specimen: Nasopharyngeal Swab  Result Value Ref Range Status   SARS Coronavirus 2 NEGATIVE NEGATIVE Final    Comment: (NOTE) If result is NEGATIVE SARS-CoV-2 target nucleic acids are NOT DETECTED. The SARS-CoV-2 RNA is generally detectable in upper and lower  respiratory specimens during the acute phase of infection. The lowest  concentration of SARS-CoV-2 viral copies this assay can detect is 250  copies / mL. A negative result does not preclude SARS-CoV-2 infection  and should not be used as the sole basis for treatment or other  patient management decisions.  A  negative result may occur with  improper specimen collection / handling, submission of specimen other  than nasopharyngeal swab, presence of viral mutation(s) within the  areas targeted by this assay, and inadequate number of viral copies  (<250 copies / mL). A negative result must be combined with clinical  observations, patient history, and epidemiological information. If result is POSITIVE SARS-CoV-2 target nucleic acids are DETECTED. The SARS-CoV-2 RNA is generally detectable in upper and lower  respiratory specimens dur ing the acute phase of infection.  Positive  results are indicative of active infection with SARS-CoV-2.  Clinical  correlation with patient history and other diagnostic information is  necessary to determine patient infection status.  Positive results do  not rule out bacterial infection or co-infection with other viruses. If result is PRESUMPTIVE POSTIVE SARS-CoV-2 nucleic acids MAY BE PRESENT.   A presumptive positive result was obtained on the submitted specimen  and confirmed on repeat testing.  While 2019 novel coronavirus  (SARS-CoV-2) nucleic acids may be present in the submitted sample  additional confirmatory testing may be necessary for epidemiological  and / or clinical management purposes  to differentiate between  SARS-CoV-2 and other Sarbecovirus currently known to infect humans.  If clinically indicated additional testing with an alternate test  methodology [phone]([id number]) is advised. The SARS-CoV-2 RNA is generally  detectable in upper and lower respiratory sp ecimens during the acute  phase of infection. The expected result is Negative. Fact Sheet for Patients:  BoilerBrush.com.cyhttps://www.fda.gov/media/136312/download Fact Sheet for Healthcare Providers: https://pope.com/https://www.fda.gov/media/136313/download This test is not yet approved or cleared by the [place] FDA and has been authorized for detection and/or diagnosis of SARS-CoV-2 by FDA under an Emergency Use  Authorization (EUA).  This EUA will remain in effect (meaning this test can be used) for the duration of the COVID-19 declaration under Section 564(b)(1) of the Act, 21 U.S.C. section 360bbb-3(b)(1), unless the authorization is terminated or revoked sooner. Performed at [facility], [address] [place], [place] [address]     Studies/Results: No results found.  Medications: Scheduled Meds: . abacavir-dolutegravir-lamiVUDine  1 tablet Oral Daily  . enoxaparin (LOVENOX) injection  40 mg Subcutaneous Q24H  . HYDROmorphone   Intravenous Q4H  . hydroxyurea  2,000 mg Oral QHS  . multivitamin with minerals  1 tablet Oral QHS  . senna-docusate  1 tablet Oral BID  . sodium chloride flush  10-40 mL Intracatheter Q12H   Continuous Infusions: PRN Meds:.acetaminophen, ALPRAZolam, diphenhydrAMINE, naloxone **AND** sodium chloride flush, ondansetron (ZOFRAN) IV, oxyCODONE, polyethylene glycol, sodium chloride flush  Assessment/Plan: Active Problems:   Sickle cell disease with crisis (HCC)   Generalized anxiety disorder   HIV (human immunodeficiency virus infection) (HCC)   Sickle cell pain crisis (HCC)  Sickle cell disease with pain crisis: Weaning IV Dilaudid PCA.  Settings changed to 0.5 mg, 10-minute lockout, and 3 mg/h. Oxycodone 10 mg every 4 hours as needed for moderate to severe breakthrough pain.  Continue IV fluids at [place] oxygen saturation above 90%, 2 L supplemental oxygen as needed Reevaluate pain scale regularly  Chronic pain syndrome: Continue home medications.  HIV disease: Stable.  Continue home medications.  Anemia of chronic disease: Hemoglobin stable.  Consistent with patient's baseline.  No clinical indication for blood transfusion at this time.  Continue to monitor closely.  Generalized anxiety disorder: Continue alprazolam   Code Status: Full Code Family Communication: N/A Disposition Plan: Not yet ready for  discharge    [place], [name], [name] Patient [facility] [address] [place], [place] [address] [phone]  If 5PM-7AM, please contact night-coverage.  12/05/2018, 2:56 PM  LOS: 3 days

## 2018-12-06 MED ORDER — LACTULOSE 10 GM/15ML PO SOLN
30.0000 g | Freq: Every day | ORAL | Status: DC | PRN
  Filled 2018-12-06: qty 45

## 2018-12-06 NOTE — Progress Notes (Signed)
Patient reported constipation, contacted doctor for order. Patient then refused to take lactulose.

## 2018-12-06 NOTE — Progress Notes (Signed)
Subjective: Martin [name], a 28 year old male with a medical history significant for sickle cell disease, chronic pain syndrome, opiate dependence, anemia of chronic disease, and history of HIV disease on antiviral therapy was admitted for sickle cell pain crisis.  Patient states that pain is improved.  Pain intensity 6/10 primarily to low back and lower extremities.  Patient states that he cannot manage at home at current pain level.  Patient denies headache, dizziness, paresthesias, chest pain, shortness of breath, nausea, vomiting, or diarrhea.  Objective:  Vital signs in last 24 hours:  Vitals:   12/06/18 1631 12/06/18 2021 12/06/18 2043 12/06/18 2222  BP:   114/71   Pulse:   97   Resp: 10 12 14 10   Temp:   98.5 F (36.9 C)   TempSrc:   Oral   SpO2: 94% 95% (!) 88% 92%  Weight:      Height:        Intake/Output from previous day:   Intake/Output Summary (Last 24 hours) at 12/06/2018 2356 Last data filed at 12/06/2018 2049 Gross per 24 hour  Intake 840 ml  Output 1650 ml  Net -810 ml    Physical Exam: General: Alert, awake, oriented x3, in no acute distress.  HEENT: [place]/AT PEERL, EOMI Neck: Trachea midline,  no masses, no thyromegal,y no JVD, no carotid bruit OROPHARYNX:  Moist, No exudate/ erythema/lesions.  Heart: Regular rate and rhythm, without murmurs, rubs, gallops, PMI non-displaced, no heaves or thrills on palpation.  Lungs: Clear to auscultation, no wheezing or rhonchi noted. No increased vocal fremitus resonant to percussion  Abdomen: Soft, nontender, nondistended, positive bowel sounds, no masses no hepatosplenomegaly noted..  Neuro: No focal neurological deficits noted cranial nerves II through XII grossly intact. DTRs 2+ bilaterally upper and lower extremities. Strength 5 out of 5 in bilateral upper and lower extremities. Musculoskeletal: No warm swelling or erythema around joints, no spinal tenderness noted. Psychiatric: Patient alert and oriented x3,  good insight and cognition, good recent to remote recall. Lymph node survey: No cervical axillary or inguinal lymphadenopathy noted.  Lab Results:  Basic Metabolic Panel:    Component Value Date/Time   NA 137 12/05/2018 0838   NA 136 10/17/2018 1433   K 3.7 12/05/2018 0838   CL 101 12/05/2018 0838   CO2 29 12/05/2018 0838   BUN 7 12/05/2018 0838   BUN 6 10/17/2018 1433   CREATININE 0.71 12/05/2018 0838   GLUCOSE 108 (H) 12/05/2018 0838   CALCIUM 8.9 12/05/2018 0838   CBC:    Component Value Date/Time   WBC 8.9 12/05/2018 0838   HGB 7.1 (L) 12/05/2018 0838   HGB 9.6 (L) 10/17/2018 1433   HCT 19.2 (L) 12/05/2018 0838   HCT 26.2 (L) 10/17/2018 1433   PLT 194 12/05/2018 0838   PLT 297 10/17/2018 1433   MCV 112.3 (H) 12/05/2018 0838   MCV 108 (H) 10/17/2018 1433   NEUTROABS 3.9 12/05/2018 0838   NEUTROABS 7.0 10/17/2018 1433   LYMPHSABS 4.1 (H) 12/05/2018 0838   LYMPHSABS 4.6 (H) 10/17/2018 1433   MONOABS 0.6 12/05/2018 0838   EOSABS 0.3 12/05/2018 0838   EOSABS 0.2 10/17/2018 1433   BASOSABS 0.0 12/05/2018 0838   BASOSABS 0.1 10/17/2018 1433    Recent Results (from the past 240 hour(s))  SARS Coronavirus 2 [facility](Hospital order, Performed in [facility] hospital lab) Nasopharyngeal Nasopharyngeal Swab     Status: None   Collection Time: 12/02/18 11:11 AM   Specimen: Nasopharyngeal Swab  Result Value Ref Range  Status   SARS Coronavirus 2 NEGATIVE NEGATIVE Final    Comment: (NOTE) If result is NEGATIVE SARS-CoV-2 target nucleic acids are NOT DETECTED. The SARS-CoV-2 RNA is generally detectable in upper and lower  respiratory specimens during the acute phase of infection. The lowest  concentration of SARS-CoV-2 viral copies this assay can detect is 250  copies / mL. A negative result does not preclude SARS-CoV-2 infection  and should not be used as the sole basis for treatment or other  patient management decisions.  A negative result may occur with  improper specimen  collection / handling, submission of specimen other  than nasopharyngeal swab, presence of viral mutation(s) within the  areas targeted by this assay, and inadequate number of viral copies  (<250 copies / mL). A negative result must be combined with clinical  observations, patient history, and epidemiological information. If result is POSITIVE SARS-CoV-2 target nucleic acids are DETECTED. The SARS-CoV-2 RNA is generally detectable in upper and lower  respiratory specimens dur ing the acute phase of infection.  Positive  results are indicative of active infection with SARS-CoV-2.  Clinical  correlation with patient history and other diagnostic information is  necessary to determine patient infection status.  Positive results do  not rule out bacterial infection or co-infection with other viruses. If result is PRESUMPTIVE POSTIVE SARS-CoV-2 nucleic acids MAY BE PRESENT.   A presumptive positive result was obtained on the submitted specimen  and confirmed on repeat testing.  While 2019 novel coronavirus  (SARS-CoV-2) nucleic acids may be present in the submitted sample  additional confirmatory testing may be necessary for epidemiological  and / or clinical management purposes  to differentiate between  SARS-CoV-2 and other Sarbecovirus currently known to infect humans.  If clinically indicated additional testing with an alternate test  methodology [phone]([id number]) is advised. The SARS-CoV-2 RNA is generally  detectable in upper and lower respiratory sp ecimens during the acute  phase of infection. The expected result is Negative. Fact Sheet for Patients:  BoilerBrush.com.cyhttps://www.fda.gov/media/136312/download Fact Sheet for Healthcare Providers: https://pope.com/https://www.fda.gov/media/136313/download This test is not yet approved or cleared by the [place] FDA and has been authorized for detection and/or diagnosis of SARS-CoV-2 by FDA under an Emergency Use Authorization (EUA).  This EUA will remain in effect  (meaning this test can be used) for the duration of the COVID-19 declaration under Section 564(b)(1) of the Act, 21 U.S.C. section 360bbb-3(b)(1), unless the authorization is terminated or revoked sooner. Performed at [facility], [address] [place], [place] [address]     Studies/Results: No results found.  Medications: Scheduled Meds: . abacavir-dolutegravir-lamiVUDine  1 tablet Oral Daily  . enoxaparin (LOVENOX) injection  40 mg Subcutaneous Q24H  . HYDROmorphone   Intravenous Q4H  . hydroxyurea  2,000 mg Oral QHS  . multivitamin with minerals  1 tablet Oral QHS  . senna-docusate  1 tablet Oral BID  . sodium chloride flush  10-40 mL Intracatheter Q12H   Continuous Infusions: PRN Meds:.acetaminophen, ALPRAZolam, diphenhydrAMINE, lactulose, naloxone **AND** sodium chloride flush, ondansetron (ZOFRAN) IV, oxyCODONE, polyethylene glycol, sodium chloride flush   Assessment/Plan: Active Problems:   Sickle cell disease with crisis (HCC)   Generalized anxiety disorder   HIV (human immunodeficiency virus infection) (HCC)   Sickle cell pain crisis (HCC)   Sickle cell anemia with pain crisis: Weaning IV Dilaudid via PCA. Oxycodone 10 mg every 4 hours as needed for moderate to severe breakthrough pain.  [facility]. Maintain oxygen saturation above 90%,  2 L supplemental oxygen as needed.  Chronic pain syndrome: Stable.  Continue home medications.  HIV disease: Stable.  Continue antiviral therapy.  Anemia of chronic disease: Hemoglobin stable.  Consistent with patient's baseline.  No clinical indication for blood transfusion at this time.  Continue to monitor closely.  Generalized anxiety disorder: Stable.  Continue alprazolam as needed  Code Status: Full Code Family Communication: N/A Disposition Plan: Not yet ready for discharge  [place], [name], [name] Patient [place], [place] [address] [phone]  If 7PM-7AM, please contact night-coverage.  12/06/2018, 11:56 PM  LOS: 4 days

## 2018-12-07 LAB — CBC WITH DIFFERENTIAL/PLATELET
Abs Immature Granulocytes: 0.03 10*3/uL (ref 0.00–0.07)
Basophils Absolute: 0 10*3/uL (ref 0.0–0.1)
Basophils Relative: 0 %
Eosinophils Absolute: 0.3 10*3/uL (ref 0.0–0.5)
Eosinophils Relative: 2 %
HCT: 19.9 % — ABNORMAL LOW (ref 39.0–52.0)
Hemoglobin: 7.4 g/dL — ABNORMAL LOW (ref 13.0–17.0)
Immature Granulocytes: 0 %
Lymphocytes Relative: 52 %
Lymphs Abs: 6 10*3/uL — ABNORMAL HIGH (ref 0.7–4.0)
MCH: 42.8 pg — ABNORMAL HIGH (ref 26.0–34.0)
MCHC: 37.2 g/dL — ABNORMAL HIGH (ref 30.0–36.0)
MCV: 115 fL — ABNORMAL HIGH (ref 80.0–100.0)
Monocytes Absolute: 1.3 10*3/uL — ABNORMAL HIGH (ref 0.1–1.0)
Monocytes Relative: 11 %
Neutro Abs: 4.1 10*3/uL (ref 1.7–7.7)
Neutrophils Relative %: 35 %
Platelets: 273 10*3/uL (ref 150–400)
RBC: 1.73 MIL/uL — ABNORMAL LOW (ref 4.22–5.81)
RDW: 24.6 % — ABNORMAL HIGH (ref 11.5–15.5)
WBC: 11.6 10*3/uL — ABNORMAL HIGH (ref 4.0–10.5)
nRBC: 16.8 % — ABNORMAL HIGH (ref 0.0–0.2)

## 2018-12-07 MED ORDER — PROMETHAZINE HCL 25 MG/ML IJ SOLN
12.5000 mg | Freq: Once | INTRAMUSCULAR | Status: AC
  Administered 2018-12-07: 12.5 mg via INTRAVENOUS
  Filled 2018-12-07: qty 1

## 2018-12-07 NOTE — Discharge Summary (Signed)
Physician Discharge Summary  Martin [name] ZOX:[id number] DOB: 01/23/1991 DOA: 12/02/2018  PCP: Martin [name], Martin M, [name]  Admit date: 12/02/2018  Discharge date: 12/07/2018  Discharge Diagnoses:  Active Problems:   Sickle cell disease with crisis (HCC)   Generalized anxiety disorder   HIV (human immunodeficiency virus infection) (HCC)   Sickle cell pain crisis [facility](HCC)   Discharge Condition: Stable  Disposition:  Follow-up Information    Martin [name], Martin M, [name] Follow up in 2 week(s).   Specialty: Family Medicine Contact information: [address] [place] [address] [phone]          Pt is discharged home in good condition and is to follow up with Martin [name], Martin M, [name] this week to have labs evaluated. Martin [name] is instructed to increase activity slowly and balance with rest for the next few days, and use prescribed medication to complete treatment of pain  Diet: Regular Wt Readings from Last 3 Encounters:  12/05/18 70.2 kg  12/01/18 68 kg  10/17/18 66.2 kg    History of present illness:  Martin [name]  is a 28 y.o. male 28 year old male with a medical history significant for sickle cell disease, HIV on antiretroviral therapy, and chronic pain syndrome presented to ER complaining of pain primarily to low back and lower extremities.  Patient states that he has been dealing with pain over the past several days.  He states that overnight pain intensity increased to the point where his pain medications were ineffective.  He last had Percocet on last night without sustained relief.  Patient was evaluated in the ER 24 hours prior without complete resolution of his pain.  Pain intensity 9/10 characterized as constant and aching.  Patient states that he has been unable to get comfortable.  He denies fever, chills, sick contacts, or exposure to COVID-19.  He also denies shortness of breath, chest pain, dizziness, dysuria, abdominal pain, nausea, vomiting, or  diarrhea.  ER course: WBCs 12.6, hemoglobin 8.2, and total bilirubin 7.6.  Patient afebrile and maintaining oxygen saturation at 100% on RA.  Patient's pain persists despite Dilaudid SQ, Toradol, and patient was unable to get IV fluids due to poor venous access.  PICC line placed in the ER.  Patient admitted to MedSurg for further management of sickle cell pain crisis.  Hospital Course:  Sickle cell pain crisis: Patient was admitted for sickle cell pain crisis and managed appropriately with IVF, IV Dilaudid via PCA and IV Toradol, as well as other adjunct therapies per sickle cell pain management protocols.  PCA weaned appropriately.  Patient transition to oxycodone 10 mg every 4 hours for severe pain.  Advised to resume home medications. Patient to follow-up with PCP in 1 week to repeat CBC and BMP. Patient has a history of HIV disease: He is followed by outpatient infectious disease.  Previous viral load undetectable.  Patient continued on antiviral therapy throughout admission.  Advised to resume medications upon discharge.  History of generalized anxiety disorder: Patient denies any suicidal or homicidal ideations.  He was continued on alprazolam as needed throughout admission.  Advised to follow-up with PCP as scheduled.  Patient was discharged home today in a hemodynamically stable condition.   Discharge Exam: Vitals:   12/07/18 1157 12/07/18 1207  BP: 103/60   Pulse: 73   Resp: 16 12  Temp: 98.6 F (37 C)   SpO2: 99% 95%   Vitals:   12/07/18 0811 12/07/18 0826 12/07/18 1157 12/07/18 1207  BP: 110/68  103/60  Pulse: 80  73   Resp: 18 13 16 12   Temp: 99.5 F (37.5 C)  98.6 F (37 C)   TempSrc:   Oral   SpO2: 95% 94% 99% 95%  Weight:      Height:        General appearance : Awake, alert, not in any distress. Speech Clear. Not toxic looking HEENT: Atraumatic and Normocephalic, pupils equally reactive to light and accomodation Neck: Supple, no JVD. No cervical  lymphadenopathy.  Chest: Good air entry bilaterally, no added sounds  CVS: S1 S2 regular, no murmurs.  Abdomen: Bowel sounds present, Non tender and not distended with no gaurding, rigidity or rebound. Extremities: B/L Lower Ext shows no edema, both legs are warm to touch Neurology: Awake alert, and oriented X 3, CN II-XII intact, Non focal Skin: No Rash  Discharge Instructions  Discharge Instructions    Discharge patient   Complete by: As directed    Discharge disposition: 01-Home or Self Care   Discharge patient date: 12/07/2018     Allergies as of 12/07/2018      Reactions   Ketorolac Swelling   Patient reports facial edema and left arm edema after administration.      Medication List    TAKE these medications   ALPRAZolam 1 MG tablet Commonly known as: XANAX Take 1 mg by mouth 2 (two) times daily as needed for anxiety.   diphenhydrAMINE 25 MG tablet Commonly known as: BENADRYL Take 25 mg by mouth every 6 (six) hours as needed for allergies.   hydroxyurea 500 MG capsule Commonly known as: HYDREA Take 4 capsules (2,000 mg total) by mouth at bedtime.   multivitamin with minerals Tabs tablet Take 1 tablet by mouth daily.   naloxone 0.4 MG/ML injection Commonly known as: NARCAN As needed.   oxyCODONE-acetaminophen 10-325 MG tablet Commonly known as: PERCOCET Take 1 tablet by mouth every 4 (four) hours as needed for pain.   Triumeq 600-50-300 MG tablet Generic drug: abacavir-dolutegravir-lamiVUDine Take 1 tablet by mouth daily.   Vitamin D (Ergocalciferol) 1.25 MG (50000 UT) Caps capsule Commonly known as: DRISDOL Take 1 capsule (50,000 Units total) by mouth every 7 (seven) days.       The results of significant diagnostics from this hospitalization (including imaging, microbiology, ancillary and laboratory) are listed below for reference.    Significant Diagnostic Studies: [place] Ekg Site Rite  Result Date: 12/02/2018 If Site Rite image not attached,  placement could not be confirmed due to current cardiac rhythm.  [place] Ekg Site Rite  Result Date: 12/02/2018 If Site Rite image not attached, placement could not be confirmed due to current cardiac rhythm.   Microbiology: Recent Results (from the past 240 hour(s))  SARS Coronavirus 2 [facility](Hospital order, Performed in [facility] hospital lab) Nasopharyngeal Nasopharyngeal Swab     Status: None   Collection Time: 12/02/18 11:11 AM   Specimen: Nasopharyngeal Swab  Result Value Ref Range Status   SARS Coronavirus 2 NEGATIVE NEGATIVE Final    Comment: (NOTE) If result is NEGATIVE SARS-CoV-2 target nucleic acids are NOT DETECTED. The SARS-CoV-2 RNA is generally detectable in upper and lower  respiratory specimens during the acute phase of infection. The lowest  concentration of SARS-CoV-2 viral copies this assay can detect is 250  copies / mL. A negative result does not preclude SARS-CoV-2 infection  and should not be used as the sole basis for treatment or other  patient management decisions.  A negative result may occur with  improper  specimen collection / handling, submission of specimen other  than nasopharyngeal swab, presence of viral mutation(s) within the  areas targeted by this assay, and inadequate number of viral copies  (<250 copies / mL). A negative result must be combined with clinical  observations, patient history, and epidemiological information. If result is POSITIVE SARS-CoV-2 target nucleic acids are DETECTED. The SARS-CoV-2 RNA is generally detectable in upper and lower  respiratory specimens dur ing the acute phase of infection.  Positive  results are indicative of active infection with SARS-CoV-2.  Clinical  correlation with patient history and other diagnostic information is  necessary to determine patient infection status.  Positive results do  not rule out bacterial infection or co-infection with other viruses. If result is PRESUMPTIVE POSTIVE SARS-CoV-2 nucleic  acids MAY BE PRESENT.   A presumptive positive result was obtained on the submitted specimen  and confirmed on repeat testing.  While 2019 novel coronavirus  (SARS-CoV-2) nucleic acids may be present in the submitted sample  additional confirmatory testing may be necessary for epidemiological  and / or clinical management purposes  to differentiate between  SARS-CoV-2 and other Sarbecovirus currently known to infect humans.  If clinically indicated additional testing with an alternate test  methodology [phone]) is advised. The SARS-CoV-2 RNA is generally  detectable in upper and lower respiratory sp ecimens during the acute  phase of infection. The expected result is Negative. Fact Sheet for Patients:  StrictlyIdeas.no Fact Sheet for Healthcare Providers: BankingDealers.co.za This test is not yet approved or cleared by the [place] FDA and has been authorized for detection and/or diagnosis of SARS-CoV-2 by FDA under an Emergency Use Authorization (EUA).  This EUA will remain in effect (meaning this test can be used) for the duration of the COVID-19 declaration under Section 564(b)(1) of the Act, 21 U.S.C. section 360bbb-3(b)(1), unless the authorization is terminated or revoked sooner. Performed at [facility], [place] [address] [place], [place] [address]      Labs: Basic Metabolic Panel: Recent Labs  Lab 12/01/18 0206 12/03/18 0345 12/04/18 1213 12/05/18 0838  NA 138 138 137 137  K 4.0 3.8 3.8 3.7  CL 108 106 105 101  CO2 23 27 26 29   GLUCOSE 99 93 91 108*  BUN 10 11 7 7   CREATININE 0.64 0.76 0.73 0.71  CALCIUM 8.9 8.7* 8.7* 8.9   Liver Function Tests: Recent Labs  Lab 12/01/18 0206 12/03/18 0345 12/04/18 1213 12/05/18 0838  AST 35 37 37 42*  ALT 24 26 25 28   ALKPHOS 63 62 55 53  BILITOT 7.6* 7.4* 7.2* 7.2*  PROT 9.1* 8.1 7.9 8.4*  ALBUMIN 4.0 3.7 3.5 3.7   No results for input(s):  LIPASE, AMYLASE in the last 168 hours. No results for input(s): AMMONIA in the last 168 hours. CBC: Recent Labs  Lab 12/01/18 0206 12/03/18 0345 12/04/18 1213 12/05/18 0838 12/07/18 0328  WBC 12.6* 12.0* 8.8 8.9 11.6*  NEUTROABS 3.0 3.6 2.3 3.9 4.1  HGB 8.2* 7.4* 7.4* 7.1* 7.4*  HCT 23.0* 20.2* 20.2* 19.2* 19.9*  MCV 119.2* 116.1* 113.5* 112.3* 115.0*  PLT 225 179 194 194 273   Cardiac Enzymes: No results for input(s): CKTOTAL, CKMB, CKMBINDEX, TROPONINI in the last 168 hours. BNP: Invalid input(s): POCBNP CBG: No results for input(s): GLUCAP in the last 168 hours.  Time coordinating discharge: 50 minutes  Signed: Donia Pounds  APRN, [name], [name] Patient [place], [place]  [phone]  [facility] 12/07/2018, 1:23 PM

## 2018-12-07 NOTE — Progress Notes (Signed)
Pt discharged home in stable condition. Discharge instructions given. Pt verbalized understanding. No immediate questions or concerns at this time.  

## 2018-12-07 NOTE — Progress Notes (Signed)
[employer] called for transportation home, per pt request. Reference # [id number]

## 2018-12-07 NOTE — Discharge Instructions (Signed)
Sickle Cell Anemia, Adult ° °Sickle cell anemia is a condition where your red blood cells are shaped like sickles. Red blood cells carry oxygen through the body. Sickle-shaped cells do not live as long as normal red blood cells. They also clump together and block blood from flowing through the blood vessels. This prevents the body from getting enough oxygen. Sickle cell anemia causes organ damage and pain. It also increases the risk of infection. °Follow these instructions at home: °Medicines °· Take over-the-counter and prescription medicines only as told by your doctor. °· If you were prescribed an antibiotic medicine, take it as told by your doctor. Do not stop taking the antibiotic even if you start to feel better. °· If you develop a fever, do not take medicines to lower the fever right away. Tell your doctor about the fever. °Managing pain, stiffness, and swelling °· Try these methods to help with pain: °? Use a heating pad. °? Take a warm bath. °? Distract yourself, such as by watching TV. °Eating and drinking °· Drink enough fluid to keep your pee (urine) clear or pale yellow. Drink more in hot weather and during exercise. °· Limit or avoid alcohol. °· Eat a healthy diet. Eat plenty of fruits, vegetables, whole grains, and lean protein. °· Take vitamins and supplements as told by your doctor. °Traveling °· When traveling, keep these with you: °? Your medical information. °? The names of your doctors. °? Your medicines. °· If you need to take an airplane, talk to your doctor first. °Activity °· Rest often. °· Avoid exercises that make your heart beat much faster, such as jogging. °General instructions °· Do not use products that have nicotine or tobacco, such as cigarettes and e-cigarettes. If you need help quitting, ask your doctor. °· Consider wearing a medical alert bracelet. °· Avoid being in high places (high altitudes), such as mountains. °· Avoid very hot or cold temperatures. °· Avoid places where the  temperature changes a lot. °· Keep all follow-up visits as told by your doctor. This is important. °Contact a doctor if: °· A joint hurts. °· Your feet or hands hurt or swell. °· You feel tired (fatigued). °Get help right away if: °· You have symptoms of infection. These include: °? Fever. °? Chills. °? Being very tired. °? Irritability. °? Poor eating. °? Throwing up (vomiting). °· You feel dizzy or faint. °· You have new stomach pain, especially on the left side. °· You have a an erection (priapism) that lasts more than 4 hours. °· You have numbness in your arms or legs. °· You have a hard time moving your arms or legs. °· You have trouble talking. °· You have pain that does not go away when you take medicine. °· You are short of breath. °· You are breathing fast. °· You have a long-term cough. °· You have pain in your chest. °· You have a bad headache. °· You have a stiff neck. °· Your stomach looks bloated even though you did not eat much. °· Your skin is pale. °· You suddenly cannot see well. °Summary °· Sickle cell anemia is a condition where your red blood cells are shaped like sickles. °· Follow your doctor's advice on ways to manage pain, food to eat, activities to do, and steps to take for safe travel. °· Get medical help right away if you have any signs of infection, such as a fever. °This information is not intended to replace advice given to you by   your health care provider. Make sure you discuss any questions you have with your health care provider. °Document Released: 01/25/2013 Document Revised: 07/29/2018 Document Reviewed: 05/12/2016 °Elsevier Patient Education © 2020 [employer] ° °

## 2018-12-16 ENCOUNTER — Encounter: Payer: Self-pay | Admitting: Family Medicine

## 2018-12-16 ENCOUNTER — Ambulatory Visit (INDEPENDENT_AMBULATORY_CARE_PROVIDER_SITE_OTHER): Payer: PRIVATE HEALTH INSURANCE | Admitting: Family Medicine

## 2018-12-16 ENCOUNTER — Other Ambulatory Visit: Payer: Self-pay

## 2018-12-16 VITALS — BP 106/68 | HR 94 | Temp 97.8°F | Ht 75.0 in | Wt 151.8 lb

## 2018-12-16 DIAGNOSIS — F1190 Opioid use, unspecified, uncomplicated: Secondary | ICD-10-CM

## 2018-12-16 DIAGNOSIS — G8929 Other chronic pain: Secondary | ICD-10-CM

## 2018-12-16 DIAGNOSIS — D571 Sickle-cell disease without crisis: Secondary | ICD-10-CM

## 2018-12-16 DIAGNOSIS — Z452 Encounter for adjustment and management of vascular access device: Secondary | ICD-10-CM

## 2018-12-16 DIAGNOSIS — F411 Generalized anxiety disorder: Secondary | ICD-10-CM

## 2018-12-16 DIAGNOSIS — Z09 Encounter for follow-up examination after completed treatment for conditions other than malignant neoplasm: Secondary | ICD-10-CM

## 2018-12-16 MED ORDER — OXYCODONE-ACETAMINOPHEN 10-325 MG PO TABS: 1.0000 | ORAL_TABLET | ORAL | 0 refills | Status: DC | PRN

## 2018-12-16 MED ORDER — ALPRAZOLAM 1 MG PO TABS: 1.0000 mg | ORAL_TABLET | Freq: Two times a day (BID) | ORAL | 0 refills | Status: DC | PRN

## 2018-12-16 MED FILL — OXYCODONE-APAP 10-325: 10-325 | 15 days supply | Qty: 90 | Fill #0

## 2018-12-16 NOTE — Progress Notes (Signed)
[facility]  Hospital Follow Up   Subjective:  Patient ID: Martin [name], male    DOB: June 04, 1990  Age: 28 y.o. MRN: [id number]  CC:  Chief Complaint  Patient presents with  . Follow-up    follow up , no other concerns     HPI Martin [name] is a 28 year old male who presents for Follow Up today.   Past Medical History:  Diagnosis Date  . Anxiety   . HIV (human immunodeficiency virus infection) (HCC)   . Sickle cell crisis (HCC)   . 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HCC)   . Vitamin D deficiency 10/2018   Current Status: Since his last office visit, he has has several hospital visits for Sickle Cell Crisis. He is doing well with no complaints. He states that he has pain in bilaterally in pelvic area, upper legs, and spine. He rates his pain today at 6/10. He has not had a hospital visit for Sickle Cell Crisis since 12/02/2018 where he treated and discharged on 12/07/2018. He is currently taking all medications as prescribed and staying well hydrated. He reports occasional nausea, constipation, dizziness and headaches. He continues to follow up with [facility]. His last appointment was on 01/2018. He is a current resident of [place], [place] for [employer]. He continues to follow up with Erskin Burnet, MD in [place]. His anxiety is mild today. He denies suicidal ideations, homicidal ideations, or auditory hallucinations. He is requesting Port-a-Cath placement, because of difficult peripheral IV starts. He denies fevers, chills, fatigue, recent infections, weight loss, and night sweats. No reports of GI problems such as diarrhea, and constipation. He has no reports of blood in stools, dysuria and hematuria. He denies pain today.   No past surgical history on file.  Family History  Problem Relation Age of Onset  . Sickle cell trait Mother   . Sickle cell trait Father     Social History   Socioeconomic History  . Marital  status: Single    Spouse name: Not on file  . Number of children: Not on file  . Years of education: Not on file  . Highest education level: Not on file  Occupational History  . Not on file  Social Needs  . Financial resource strain: Not on file  . Food insecurity    Worry: Not on file    Inability: Not on file  . Transportation needs    Medical: Not on file    Non-medical: Not on file  Tobacco Use  . Smoking status: Never Smoker  . Smokeless tobacco: Never Used  Substance and Sexual Activity  . Alcohol use: No  . Drug use: No  . Sexual activity: Yes  Lifestyle  . Physical activity    Days per week: Not on file    Minutes per session: Not on file  . Stress: Not on file  Relationships  . Social Musician on phone: Not on file    Gets together: Not on file    Attends religious service: Not on file    Active member of club or organization: Not on file    Attends meetings of clubs or organizations: Not on file    Relationship status: Not on file  . Intimate partner violence    Fear of current or ex partner: Not on file    Emotionally abused: Not on file    Physically abused: Not on file    Forced  sexual activity: Not on file  Other Topics Concern  . Not on file  Social History Narrative  . Not on file    Outpatient Medications Prior to Visit  Medication Sig Dispense Refill  . abacavir-dolutegravir-lamiVUDine (TRIUMEQ) 600-50-300 MG tablet Take 1 tablet by mouth daily.    . cetirizine (ZYRTEC) 10 MG chewable tablet Chew 10 mg by mouth daily.    . diphenhydrAMINE (BENADRYL) 25 MG tablet Take 25 mg by mouth every 6 (six) hours as needed for allergies.    . hydroxyurea (HYDREA) 500 MG capsule Take 4 capsules (2,000 mg total) by mouth at bedtime. 120 capsule 3  . Multiple Vitamin (MULTIVITAMIN WITH MINERALS) TABS tablet Take 1 tablet by mouth daily.    . naloxone (NARCAN) 0.4 MG/ML injection As needed. 1 mL 2  . ALPRAZolam (XANAX) 1 MG tablet Take 1 mg by mouth  2 (two) times daily as needed for anxiety.   0  . oxyCODONE-acetaminophen (PERCOCET) 10-325 MG tablet Take 1 tablet by mouth every 4 (four) hours as needed for pain. 90 tablet 0  . Vitamin D, Ergocalciferol, (DRISDOL) 1.25 MG (50000 UT) CAPS capsule Take 1 capsule (50,000 Units total) by mouth every 7 (seven) days. (Patient not taking: Reported on 12/16/2018) 5 capsule 3   No facility-administered medications prior to visit.     Allergies  Allergen Reactions  . Ketorolac Swelling    Patient reports facial edema and left arm edema after administration.    ROS Review of Systems  Constitutional: Negative.   HENT: Negative.   Eyes: Negative.   Respiratory: Positive for shortness of breath (occasional ).   Cardiovascular: Negative.   Gastrointestinal: Positive for constipation (occasional) and nausea (occasional ).  Endocrine: Negative.   Genitourinary: Negative.   Musculoskeletal: Positive for arthralgias (Generalize joint pain) and back pain (chronic).  Skin: Negative.   Allergic/Immunologic: Negative.   Neurological: Positive for dizziness (occasional ) and headaches (occasional).  Hematological: Negative.   Psychiatric/Behavioral: Negative.       Objective:    Physical Exam  Constitutional: He is oriented to person, place, and time. He appears well-developed and well-nourished.  HENT:  Head: Normocephalic and atraumatic.  Eyes: Conjunctivae are normal.  Neck: Normal range of motion. Neck supple.  Cardiovascular: Normal rate, regular rhythm, normal heart sounds and intact distal pulses.  Pulmonary/Chest: Effort normal and breath sounds normal.  Abdominal: Soft. Bowel sounds are normal.  Musculoskeletal: Normal range of motion.  Neurological: He is alert and oriented to person, place, and time. He has normal reflexes.  Skin: Skin is warm and dry.  Psychiatric: He has a normal mood and affect. His behavior is normal. Judgment and thought content normal.  Nursing note and  vitals reviewed.   BP 106/68 (BP Location: Left Arm, Patient Position: Sitting, Cuff Size: Normal)   Pulse 94   Temp 97.8 F (36.6 C) (Oral)   Ht 6\' 3"  (1.905 m)   Wt 151 lb 12.8 oz (68.9 kg)   BMI 18.97 kg/m  Wt Readings from Last 3 Encounters:  12/16/18 151 lb 12.8 oz (68.9 kg)  12/05/18 154 lb 12.2 oz (70.2 kg)  12/01/18 150 lb (68 kg)     Health Maintenance Due  Topic Date Due  . INFLUENZA VACCINE  11/19/2018    There are no preventive care reminders to display for this patient.  No results found for: TSH Lab Results  Component Value Date   WBC 11.6 (H) 12/07/2018   HGB 7.4 (L) 12/07/2018  HCT 19.9 (L) 12/07/2018   MCV 115.0 (H) 12/07/2018   PLT 273 12/07/2018   Lab Results  Component Value Date   NA 137 12/05/2018   K 3.7 12/05/2018   CO2 29 12/05/2018   GLUCOSE 108 (H) 12/05/2018   BUN 7 12/05/2018   CREATININE 0.71 12/05/2018   BILITOT 7.2 (H) 12/05/2018   ALKPHOS 53 12/05/2018   AST 42 (H) 12/05/2018   ALT 28 12/05/2018   PROT 8.4 (H) 12/05/2018   ALBUMIN 3.7 12/05/2018   CALCIUM 8.9 12/05/2018   ANIONGAP 7 12/05/2018   No results found for: CHOL No results found for: HDL No results found for: LDLCALC No results found for: [name] No results found for: CHOLHDL Lab Results  Component Value Date   HGBA1C (L) 05/22/2009    <4.0 (NOTE) The ADA recommends the following therapeutic goal for glycemic control related to Hgb A1c measurement: Goal of therapy: <6.5 Hgb A1c  Reference: American Diabetes Association: Clinical Practice Recommendations 2010, Diabetes Care, 2010, 33: (Suppl  1).      Assessment & Plan:   1. Hospital discharge follow-up  2. Hb-SS disease without crisis [facility](HCC) He is doing well today. He will continue to take pain medications as prescribed; will continue to avoid extreme heat and cold; will continue to eat a healthy diet and drink at least 64 ounces of water daily; continue stool softener as needed; will avoid colds and flu;  will continue to get plenty of sleep and rest; will continue to avoid high stressful situations and remain infection free; will continue Folic Acid 1 mg daily to avoid sickle cell crisis.  - oxyCODONE-acetaminophen (PERCOCET) 10-325 MG tablet; Take 1 tablet by mouth every 4 (four) hours as needed for pain.  Dispense: 90 tablet; Refill: 0 - IR IMAGING GUIDED PORT INSERTION; Future  3. Other chronic pain - oxyCODONE-acetaminophen (PERCOCET) 10-325 MG tablet; Take 1 tablet by mouth every 4 (four) hours as needed for pain.  Dispense: 90 tablet; Refill: 0  4. Chronic, continuous use of opioids  5. Generalized anxiety disorder Stable today.  - ALPRAZolam (XANAX) 1 MG tablet; Take 1 tablet (1 mg total) by mouth 2 (two) times daily as needed for anxiety.  Dispense: 30 tablet; Refill: 0  6. Encounter for central line placement Order placed for port-a-cath placement.  - IR IMAGING GUIDED PORT INSERTION; Future  7. Follow up He will follow up in 1 month.   Meds ordered this encounter  Medications  . oxyCODONE-acetaminophen (PERCOCET) 10-325 MG tablet    Sig: Take 1 tablet by mouth every 4 (four) hours as needed for pain.    Dispense:  90 tablet    Refill:  0  . ALPRAZolam (XANAX) 1 MG tablet    Sig: Take 1 tablet (1 mg total) by mouth 2 (two) times daily as needed for anxiety.    Dispense:  30 tablet    Refill:  0    Orders Placed This Encounter  Procedures  . IR IMAGING GUIDED PORT INSERTION    Referral Orders  No referral(s) requested today    Raliegh IpNatalie Nazarene Bunning,  [name], [name]-BC [facility] [address]  [place], [place] [id number] [phone]  Problem List Items Addressed This Visit      Other   Generalized anxiety disorder   Relevant Medications   ALPRAZolam (XANAX) 1 MG tablet   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HCC)   Relevant Medications   oxyCODONE-acetaminophen (PERCOCET)  10-325 MG tablet   Other  Relevant Orders   IR IMAGING GUIDED PORT INSERTION    Other Visit Diagnoses    Hospital discharge follow-up    -  Primary   Other chronic pain       Relevant Medications   oxyCODONE-acetaminophen (PERCOCET) 10-325 MG tablet   Other Relevant Orders   IR IMAGING GUIDED PORT INSERTION   Chronic, continuous use of opioids       Encounter for central line placement       Follow up          Meds ordered this encounter  Medications  . oxyCODONE-acetaminophen (PERCOCET) 10-325 MG tablet    Sig: Take 1 tablet by mouth every 4 (four) hours as needed for pain.    Dispense:  90 tablet    Refill:  0  . ALPRAZolam (XANAX) 1 MG tablet    Sig: Take 1 tablet (1 mg total) by mouth 2 (two) times daily as needed for anxiety.    Dispense:  30 tablet    Refill:  0    Follow-up: Return in about 1 month (around 01/16/2019).    Kallie LocksNatalie M Antron Seth, [name]

## 2018-12-19 ENCOUNTER — Ambulatory Visit: Payer: PRIVATE HEALTH INSURANCE | Admitting: Family Medicine

## 2018-12-29 ENCOUNTER — Other Ambulatory Visit: Payer: Self-pay | Admitting: Radiology

## 2018-12-30 ENCOUNTER — Other Ambulatory Visit (HOSPITAL_COMMUNITY): Payer: PRIVATE HEALTH INSURANCE

## 2018-12-30 ENCOUNTER — Ambulatory Visit (HOSPITAL_COMMUNITY): Payer: PRIVATE HEALTH INSURANCE

## 2019-01-06 ENCOUNTER — Telehealth: Payer: Self-pay | Admitting: Family Medicine

## 2019-01-06 ENCOUNTER — Other Ambulatory Visit: Payer: Self-pay | Admitting: Family Medicine

## 2019-01-06 DIAGNOSIS — G8929 Other chronic pain: Secondary | ICD-10-CM

## 2019-01-06 DIAGNOSIS — D571 Sickle-cell disease without crisis: Secondary | ICD-10-CM

## 2019-01-06 MED ORDER — OXYCODONE-ACETAMINOPHEN 10-325 MG PO TABS: 1.0000 | ORAL_TABLET | ORAL | 0 refills | Status: DC | PRN

## 2019-01-06 NOTE — Telephone Encounter (Signed)
Called inform pt.

## 2019-01-16 ENCOUNTER — Ambulatory Visit (HOSPITAL_COMMUNITY)
Admission: RE | Admit: 2019-01-16 | Discharge: 2019-01-16 | Disposition: A | Discharge status: Home / Self Care | Payer: PRIVATE HEALTH INSURANCE | Source: Ambulatory Visit | Attending: Internal Medicine | Admitting: Internal Medicine

## 2019-01-16 ENCOUNTER — Ambulatory Visit (INDEPENDENT_AMBULATORY_CARE_PROVIDER_SITE_OTHER): Payer: PRIVATE HEALTH INSURANCE | Admitting: Family Medicine

## 2019-01-16 ENCOUNTER — Other Ambulatory Visit: Payer: Self-pay

## 2019-01-16 ENCOUNTER — Emergency Department (HOSPITAL_COMMUNITY)
Admission: EM | Admit: 2019-01-16 | Discharge: 2019-01-17 | Disposition: A | Discharge status: Home / Self Care | Payer: Medicaid - Out of State | Attending: Emergency Medicine | Admitting: Emergency Medicine

## 2019-01-16 ENCOUNTER — Encounter: Payer: Self-pay | Admitting: Family Medicine

## 2019-01-16 ENCOUNTER — Encounter (HOSPITAL_COMMUNITY): Payer: Self-pay | Admitting: Emergency Medicine

## 2019-01-16 VITALS — BP 101/69 | HR 72 | Temp 98.1°F | Ht 75.0 in | Wt 147.8 lb

## 2019-01-16 DIAGNOSIS — Z21 Asymptomatic human immunodeficiency virus [HIV] infection status: Secondary | ICD-10-CM | POA: Insufficient documentation

## 2019-01-16 DIAGNOSIS — Z79899 Other long term (current) drug therapy: Secondary | ICD-10-CM | POA: Diagnosis not present

## 2019-01-16 DIAGNOSIS — D5700 Hb-SS disease with crisis, unspecified: Secondary | ICD-10-CM

## 2019-01-16 DIAGNOSIS — F1190 Opioid use, unspecified, uncomplicated: Secondary | ICD-10-CM

## 2019-01-16 DIAGNOSIS — D571 Sickle-cell disease without crisis: Secondary | ICD-10-CM

## 2019-01-16 DIAGNOSIS — D57819 Other sickle-cell disorders with crisis, unspecified: Secondary | ICD-10-CM | POA: Diagnosis not present

## 2019-01-16 DIAGNOSIS — Z09 Encounter for follow-up examination after completed treatment for conditions other than malignant neoplasm: Secondary | ICD-10-CM

## 2019-01-16 DIAGNOSIS — F411 Generalized anxiety disorder: Secondary | ICD-10-CM

## 2019-01-16 DIAGNOSIS — M791 Myalgia, unspecified site: Secondary | ICD-10-CM | POA: Diagnosis present

## 2019-01-16 LAB — CBC WITH DIFFERENTIAL/PLATELET
Abs Immature Granulocytes: 0 10*3/uL (ref 0.00–0.07)
Basophils Absolute: 0 10*3/uL (ref 0.0–0.1)
Basophils Relative: 0 %
Eosinophils Absolute: 0 10*3/uL (ref 0.0–0.5)
Eosinophils Relative: 0 %
HCT: 27.4 % — ABNORMAL LOW (ref 39.0–52.0)
Hemoglobin: 10.1 g/dL — ABNORMAL LOW (ref 13.0–17.0)
Lymphocytes Relative: 43 %
Lymphs Abs: 3.8 10*3/uL (ref 0.7–4.0)
MCH: 43.5 pg — ABNORMAL HIGH (ref 26.0–34.0)
MCHC: 36.9 g/dL — ABNORMAL HIGH (ref 30.0–36.0)
MCV: 118.1 fL — ABNORMAL HIGH (ref 80.0–100.0)
Monocytes Absolute: 0.5 10*3/uL (ref 0.1–1.0)
Monocytes Relative: 6 %
Neutro Abs: 4.5 10*3/uL (ref 1.7–7.7)
Neutrophils Relative %: 51 %
Platelets: 318 10*3/uL (ref 150–400)
RBC: 2.32 MIL/uL — ABNORMAL LOW (ref 4.22–5.81)
RDW: 21.1 % — ABNORMAL HIGH (ref 11.5–15.5)
WBC: 8.8 10*3/uL (ref 4.0–10.5)
nRBC: 13.7 % — ABNORMAL HIGH (ref 0.0–0.2)
nRBC: 39 /100 WBC — ABNORMAL HIGH

## 2019-01-16 LAB — COMPREHENSIVE METABOLIC PANEL
ALT: 21 U/L (ref 0–44)
AST: 34 U/L (ref 15–41)
Albumin: 3.9 g/dL (ref 3.5–5.0)
Alkaline Phosphatase: 55 U/L (ref 38–126)
Anion gap: 10 (ref 5–15)
BUN: 9 mg/dL (ref 6–20)
CO2: 22 mmol/L (ref 22–32)
Calcium: 8.8 mg/dL — ABNORMAL LOW (ref 8.9–10.3)
Chloride: 108 mmol/L (ref 98–111)
Creatinine, Ser: 0.84 mg/dL (ref 0.61–1.24)
GFR calc Af Amer: >60 mL/min (ref >60)
GFR calc non Af Amer: >60 mL/min (ref >60)
Glucose, Bld: 125 mg/dL — ABNORMAL HIGH (ref 70–99)
Potassium: 3.3 mmol/L — ABNORMAL LOW (ref 3.5–5.1)
Sodium: 140 mmol/L (ref 135–145)
Total Bilirubin: 7.3 mg/dL — ABNORMAL HIGH (ref 0.3–1.2)
Total Protein: 8.5 g/dL — ABNORMAL HIGH (ref 6.5–8.1)

## 2019-01-16 LAB — RETICULOCYTES

## 2019-01-16 MED ORDER — HEPARIN SOD (PORK) LOCK FLUSH 10 UNIT/ML IV SOLN
10.0000 [IU] | Freq: Once | INTRAVENOUS | Status: DC
  Filled 2019-01-16: qty 1

## 2019-01-16 MED ORDER — SODIUM CHLORIDE (PF) 0.9 % IJ SOLN: 10.0000 mL | Freq: Once | INTRAMUSCULAR | Status: DC

## 2019-01-16 MED ORDER — SODIUM CHLORIDE 0.9% FLUSH: 3.0000 mL | Freq: Once | INTRAVENOUS | Status: DC

## 2019-01-16 MED ORDER — SODIUM CHLORIDE 0.9 % IV SOLN: INTRAVENOUS | Status: DC

## 2019-01-16 MED FILL — ALPRAZolam 1 MG TABS: 1 | 15 days supply | Qty: 30 | Fill #0

## 2019-01-16 NOTE — Progress Notes (Signed)
[facility]    Established Patient Office Visit  Subjective:  Patient ID: Martin [name], male    DOB: 01/23/1991  Age: 28 y.o. MRN: [id number]  CC:  Chief Complaint  Patient presents with  . Follow-up    1 month follow up , having alot , pain though the weekend     HPI Martin [name] is a 28 year old male who presents for Follow Up today.   Past Medical History:  Diagnosis Date  . Anxiety   . HIV (human immunodeficiency virus infection) (HCC)   . Sickle cell crisis (HCC)   . 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HCC)   . Vitamin D deficiency 10/2018   Current Status: Since his last office visit, he is doing well with no complaints. He is experiencing increased pain today. He states that has pain in his increased lower back. He rates his pain today at 8/10. He has not had a hospital visit for Sickle Cell Crisis since 12/26/2018  Where he was treated and discharged the same day. He is currently taking all medications as prescribed and staying well hydrated. He reports occasional nausea, constipation, dizziness and headaches.   He denies fevers, chills, fatigue, recent infections, weight loss, and night sweats. He has not had any headaches, visual changes, dizziness, and falls. No chest pain, heart palpitations, cough and shortness of breath reported. No reports of GI problems such as nausea, vomiting, diarrhea, and constipation. He has no reports of blood in stools, dysuria and hematuria. No depression or anxiety reported today.   No past surgical history on file.  Family History  Problem Relation Age of Onset  . Sickle cell trait Mother   . Sickle cell trait Father   . Birth defects Maternal Grandmother   . Birth defects Paternal Grandmother     Social History   Socioeconomic History  . Marital status: Single    Spouse name: Not on file  . Number of children: Not on file  . Years of education: Not on file  . Highest education  level: Not on file  Occupational History  . Not on file  Social Needs  . Financial resource strain: Not on file  . Food insecurity    Worry: Not on file    Inability: Not on file  . Transportation needs    Medical: Not on file    Non-medical: Not on file  Tobacco Use  . Smoking status: Never Smoker  . Smokeless tobacco: Never Used  Substance and Sexual Activity  . Alcohol use: No  . Drug use: No  . Sexual activity: Yes  Lifestyle  . Physical activity    Days per week: Not on file    Minutes per session: Not on file  . Stress: Not on file  Relationships  . Social Musicianconnections    Talks on phone: Not on file    Gets together: Not on file    Attends religious service: Not on file    Active member of club or organization: Not on file    Attends meetings of clubs or organizations: Not on file    Relationship status: Not on file  . Intimate partner violence    Fear of current or ex partner: Not on file    Emotionally abused: Not on file    Physically abused: Not on file    Forced sexual activity: Not on file  Other Topics Concern  . Not on file  Social  History Narrative  . Not on file    Outpatient Medications Prior to Visit  Medication Sig Dispense Refill  . abacavir-dolutegravir-lamiVUDine (TRIUMEQ) 600-50-300 MG tablet Take 1 tablet by mouth daily.    Marland Kitchen ALPRAZolam (XANAX) 1 MG tablet Take 1 tablet (1 mg total) by mouth 2 (two) times daily as needed for anxiety. 30 tablet 0  . cetirizine (ZYRTEC) 10 MG chewable tablet Chew 10 mg by mouth daily as needed for allergies.     . diphenhydrAMINE (BENADRYL) 25 MG tablet Take 25 mg by mouth every 6 (six) hours as needed for allergies.    . hydroxyurea (HYDREA) 500 MG capsule Take 4 capsules (2,000 mg total) by mouth at bedtime. 120 capsule 3  . Multiple Vitamin (MULTIVITAMIN WITH MINERALS) TABS tablet Take 1 tablet by mouth daily.    . naloxone (NARCAN) 0.4 MG/ML injection As needed. (Patient taking differently: Inject 0.4 mg into  the muscle daily as needed (overdose). ) 1 mL 2  . oxyCODONE-acetaminophen (PERCOCET) 10-325 MG tablet Take 1 tablet by mouth every 4 (four) hours as needed for pain. 90 tablet 0  . Vitamin D, Ergocalciferol, (DRISDOL) 1.25 MG (50000 UT) CAPS capsule Take 1 capsule (50,000 Units total) by mouth every 7 (seven) days. 5 capsule 3   No facility-administered medications prior to visit.     Allergies  Allergen Reactions  . Ketorolac Swelling    Patient reports facial edema and left arm edema after administration.    ROS Review of Systems  Constitutional: Negative.   HENT: Negative.   Eyes: Negative.   Respiratory: Negative.   Cardiovascular: Negative.   Gastrointestinal: Positive for constipation (occasional ).  Endocrine: Negative.   Genitourinary: Negative.   Musculoskeletal: Positive for arthralgias (generalized joint pain) and back pain (chronic back pain).  Skin: Negative.   Allergic/Immunologic: Negative.   Neurological: Positive for dizziness (occasional ) and headaches (occasional ).  Hematological: Negative.   Psychiatric/Behavioral: Negative.       Objective:    Physical Exam  Constitutional: He is oriented to person, place, and time. He appears well-developed and well-nourished.  HENT:  Head: Normocephalic and atraumatic.  Eyes: Conjunctivae are normal.  Neck: Normal range of motion. Neck supple.  Cardiovascular: Normal rate, regular rhythm, normal heart sounds and intact distal pulses.  Pulmonary/Chest: Effort normal and breath sounds normal.  Abdominal: Soft. Bowel sounds are normal.  Musculoskeletal: Normal range of motion.  Neurological: He is alert and oriented to person, place, and time. He has normal reflexes.  Skin: Skin is warm and dry.  Psychiatric: He has a normal mood and affect. His behavior is normal. Judgment and thought content normal.  Nursing note and vitals reviewed.   BP 101/69 (BP Location: Left Arm, Patient Position: Sitting, Cuff Size:  Normal)   Pulse 72   Temp 98.1 F (36.7 C) (Oral)   Ht 6\' 3"  (1.905 m)   Wt 147 lb 12.8 oz (67 kg)   SpO2 94%   BMI 18.47 kg/m  Wt Readings from Last 3 Encounters:  01/16/19 147 lb 12.8 oz (67 kg)  12/16/18 151 lb 12.8 oz (68.9 kg)  12/05/18 154 lb 12.2 oz (70.2 kg)     Health Maintenance Due  Topic Date Due  . INFLUENZA VACCINE  11/19/2018    There are no preventive care reminders to display for this patient.  No results found for: TSH Lab Results  Component Value Date   WBC 8.8 01/16/2019   HGB 10.1 (L) 01/16/2019  HCT 27.4 (L) 01/16/2019   MCV 118.1 (H) 01/16/2019   PLT 318 01/16/2019   Lab Results  Component Value Date   NA 139 01/17/2019   K 4.0 01/17/2019   CO2 23 01/17/2019   GLUCOSE 112 (H) 01/17/2019   BUN 9 01/17/2019   CREATININE 0.63 01/17/2019   BILITOT 7.3 (H) 01/16/2019   ALKPHOS 55 01/16/2019   AST 34 01/16/2019   ALT 21 01/16/2019   PROT 8.5 (H) 01/16/2019   ALBUMIN 3.9 01/16/2019   CALCIUM 8.8 (L) 01/17/2019   ANIONGAP 9 01/17/2019   No results found for: CHOL No results found for: HDL No results found for: LDLCALC No results found for: [name] No results found for: CHOLHDL Lab Results  Component Value Date   HGBA1C (L) 05/22/2009    <4.0 (NOTE) The ADA recommends the following therapeutic goal for glycemic control related to Hgb A1c measurement: Goal of therapy: <6.5 Hgb A1c  Reference: American Diabetes Association: Clinical Practice Recommendations 2010, Diabetes Care, 2010, 33: (Suppl  1).      Assessment & Plan:   1. Hb-SS disease without crisis [facility]) He is doing well today. He will continue to take pain medications as prescribed; will continue to avoid extreme heat and cold; will continue to eat a healthy diet and drink at least 64 ounces of water daily; continue stool softener as needed; will avoid colds and flu; will continue to get plenty of sleep and rest; will continue to avoid high stressful situations and remain infection  free; will continue Folic Acid 1 mg daily to avoid sickle cell crisis.   2. Sickle cell disease with crisis [facility]) We will send him to [facility] today for IVFs to aid in moderate crisis today.   3. Generalized anxiety disorder  4. Chronic, continuous use of opioids  5. Follow up He will follow up in 2 months.   No orders of the defined types were placed in this encounter.   No orders of the defined types were placed in this encounter.   Referral Orders  No referral(s) requested today    No orders of the defined types were placed in this encounter.  Problem List Items Addressed This Visit      Other   Generalized anxiety disorder   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[place]) - Primary   Sickle cell disease with crisis ([place])    Other Visit Diagnoses    Chronic, continuous use of opioids       Follow up          No orders of the defined types were placed in this encounter.   Follow-up: Return in about 2 months (around 03/18/2019).    Azzie Glatter, [name]

## 2019-01-16 NOTE — Progress Notes (Signed)
Patient came in for normal saline bolus. However, patient soon stated he has 8/10 lower back pain. Lorel Monaco [name] and Providence Crosby. [name] contacted about the new development. Patient said he will prefer to come back tomorrow morning because he doesn't want his port accessed today and then again tomorrow. Instead, he will prefer to manage his pain with his home medications today but go to the ER if it gets worse today. Providence Crosby [name] is notified of the patient's decision. Patient to call back for triage tomorrow morning.

## 2019-01-16 NOTE — ED Triage Notes (Signed)
Pt. Stated, My sickle cell is acting up everywhere, Im hurting in my chest and everywhere

## 2019-01-17 ENCOUNTER — Encounter (HOSPITAL_COMMUNITY): Payer: Self-pay | Admitting: General Practice

## 2019-01-17 ENCOUNTER — Telehealth (HOSPITAL_COMMUNITY): Payer: Self-pay | Admitting: *Deleted

## 2019-01-17 ENCOUNTER — Emergency Department (HOSPITAL_COMMUNITY): Payer: Medicaid - Out of State

## 2019-01-17 ENCOUNTER — Non-Acute Institutional Stay (EMERGENCY_DEPARTMENT_HOSPITAL)
Admission: AD | Admit: 2019-01-17 | Discharge: 2019-01-17 | Disposition: A | Discharge status: Home / Self Care | Payer: Medicaid - Out of State | Source: Ambulatory Visit | Attending: Internal Medicine | Admitting: Internal Medicine

## 2019-01-17 DIAGNOSIS — D5700 Hb-SS disease with crisis, unspecified: Secondary | ICD-10-CM

## 2019-01-17 LAB — BASIC METABOLIC PANEL
Anion gap: 9 (ref 5–15)
BUN: 9 mg/dL (ref 6–20)
CO2: 23 mmol/L (ref 22–32)
Calcium: 8.8 mg/dL — ABNORMAL LOW (ref 8.9–10.3)
Chloride: 107 mmol/L (ref 98–111)
Creatinine, Ser: 0.63 mg/dL (ref 0.61–1.24)
GFR calc Af Amer: >60 mL/min (ref >60)
GFR calc non Af Amer: >60 mL/min (ref >60)
Glucose, Bld: 112 mg/dL — ABNORMAL HIGH (ref 70–99)
Potassium: 4 mmol/L (ref 3.5–5.1)
Sodium: 139 mmol/L (ref 135–145)

## 2019-01-17 LAB — RAPID URINE DRUG SCREEN, HOSP PERFORMED
Amphetamines: NOT DETECTED
Barbiturates: NOT DETECTED
Benzodiazepines: NOT DETECTED
Cocaine: NOT DETECTED
Opiates: POSITIVE — AB
Tetrahydrocannabinol: POSITIVE — AB

## 2019-01-17 MED ORDER — SODIUM CHLORIDE 0.9% FLUSH: 10.0000 mL | INTRAVENOUS | Status: DC | PRN

## 2019-01-17 MED ORDER — HYDROMORPHONE HCL 1 MG/ML IJ SOLN: 2.0000 mg | INTRAMUSCULAR | Status: AC

## 2019-01-17 MED ORDER — SODIUM CHLORIDE 0.45 % IV SOLN
INTRAVENOUS | Status: DC
  Administered 2019-01-17: 10:00:00 via INTRAVENOUS

## 2019-01-17 MED ORDER — CHLORHEXIDINE GLUCONATE CLOTH 2 % EX PADS: 6.0000 | MEDICATED_PAD | Freq: Every day | CUTANEOUS | Status: DC

## 2019-01-17 MED ORDER — HYDROMORPHONE HCL 1 MG/ML IJ SOLN
1.0000 mg | Freq: Once | INTRAMUSCULAR | Status: AC
  Administered 2019-01-17: 1 mg via INTRAVENOUS
  Filled 2019-01-17: qty 1

## 2019-01-17 MED ORDER — SODIUM CHLORIDE 0.9% FLUSH: 9.0000 mL | INTRAVENOUS | Status: DC | PRN

## 2019-01-17 MED ORDER — NALOXONE HCL 0.4 MG/ML IJ SOLN: 0.4000 mg | INTRAMUSCULAR | Status: DC | PRN

## 2019-01-17 MED ORDER — HEPARIN SOD (PORK) LOCK FLUSH 100 UNIT/ML IV SOLN
500.0000 [IU] | INTRAVENOUS | Status: AC | PRN
  Administered 2019-01-17: 500 [IU]

## 2019-01-17 MED ORDER — HEPARIN SOD (PORK) LOCK FLUSH 100 UNIT/ML IV SOLN
500.0000 [IU] | INTRAVENOUS | Status: AC | PRN
  Administered 2019-01-17: 500 [IU]
  Filled 2019-01-17: qty 5

## 2019-01-17 MED ORDER — HYDROMORPHONE HCL 1 MG/ML IJ SOLN
2.0000 mg | INTRAMUSCULAR | Status: AC
  Administered 2019-01-17: 2 mg via INTRAVENOUS
  Filled 2019-01-17: qty 2

## 2019-01-17 MED ORDER — DIPHENHYDRAMINE HCL 25 MG PO CAPS
25.0000 mg | ORAL_CAPSULE | ORAL | Status: DC | PRN
  Administered 2019-01-17: 25 mg via ORAL
  Filled 2019-01-17 (×2): qty 1

## 2019-01-17 MED ORDER — ONDANSETRON HCL 4 MG/2ML IJ SOLN: 4.0000 mg | Freq: Four times a day (QID) | INTRAMUSCULAR | Status: DC | PRN

## 2019-01-17 MED ORDER — DIPHENHYDRAMINE HCL 25 MG PO CAPS
25.0000 mg | ORAL_CAPSULE | ORAL | Status: DC | PRN
  Administered 2019-01-17: 25 mg via ORAL
  Filled 2019-01-17: qty 1

## 2019-01-17 MED ORDER — HYDROMORPHONE 1 MG/ML IV SOLN
INTRAVENOUS | Status: DC
  Administered 2019-01-17: 30 mg via INTRAVENOUS
  Administered 2019-01-17: 15.5 mg via INTRAVENOUS
  Filled 2019-01-17: qty 30

## 2019-01-17 MED ORDER — ACETAMINOPHEN 500 MG PO TABS
1000.0000 mg | ORAL_TABLET | Freq: Once | ORAL | Status: AC
  Administered 2019-01-17: 1000 mg via ORAL
  Filled 2019-01-17: qty 2

## 2019-01-17 NOTE — Discharge Summary (Signed)
[facility] Discharge Summary   Patient ID: Martin [name] MRN: [id number] DOB/AGE: 29-11-1990 29 y.o.  Admit date: 01/17/2019 Discharge date: 01/17/2019  Primary Care Physician:  Azzie Glatter, [name]  Admission Diagnoses:  Active Problems:   Sickle cell crisis [facility])    Discharge Medications:  Allergies as of 01/17/2019      Reactions   Ketorolac Swelling   Patient reports facial edema and left arm edema after administration.      Medication List    TAKE these medications   ALPRAZolam 1 MG tablet Commonly known as: XANAX Take 1 tablet (1 mg total) by mouth 2 (two) times daily as needed for anxiety.   cetirizine 10 MG chewable tablet Commonly known as: ZYRTEC Chew 10 mg by mouth daily as needed for allergies.   diphenhydrAMINE 25 MG tablet Commonly known as: BENADRYL Take 25 mg by mouth every 6 (six) hours as needed for allergies.   hydroxyurea 500 MG capsule Commonly known as: HYDREA Take 4 capsules (2,000 mg total) by mouth at bedtime.   multivitamin with minerals Tabs tablet Take 1 tablet by mouth daily.   naloxone 0.4 MG/ML injection Commonly known as: NARCAN As needed. What changed:   how much to take  how to take this  when to take this  reasons to take this  additional instructions   oxyCODONE-acetaminophen 10-325 MG tablet Commonly known as: PERCOCET Take 1 tablet by mouth every 4 (four) hours as needed for pain.   Triumeq 600-50-300 MG tablet Generic drug: abacavir-dolutegravir-lamiVUDine Take 1 tablet by mouth daily.   Vitamin D (Ergocalciferol) 1.25 MG (50000 UT) Caps capsule Commonly known as: DRISDOL Take 1 capsule (50,000 Units total) by mouth every 7 (seven) days.        Consults:  None  Significant Diagnostic Studies:  Dg Chest Port 1 View  Result Date: 01/17/2019 CLINICAL DATA:  Chest pain, sickle cell disease EXAM: PORTABLE CHEST 1 VIEW COMPARISON:  Most recent radiograph 04/29/2018 FINDINGS: Chronic  scarring and interstitial changes predominantly throughout the lung bases. Overall appearance and distribution is similar to comparison studies. No focal consolidative process, pneumothorax or effusion. Cardiomediastinal contours are similar to priors accounting for differences in technique. A right upper internal jugular approach Port-A-Cath tip terminates at the superior cavoatrial junction. No acute osseous or soft tissue abnormality. IMPRESSION: 1. No active cardiopulmonary disease. 2. Chronic basilar predominant scarring and interstitial changes, similar to comparison studies and compatible with history of sickle cell disease. Electronically Signed   By: Lovena Le M.D.   On: 01/17/2019 01:45   History of present illness: Martin [name], a 28 year old male with a medical history significant for sickle cell disease, chronic pain syndrome, history of HIV on antiretroviral therapy, and history of anemia of chronic disease presents complaining of pain primarily to low back and lower extremities that is consistent with his typical sickle cell pain crisis.  Patient was evaluated in primary care on 01/16/2019.  He was complaining of this problem at that time.  He was advised to continue home medications and hydrate.  Patient states that he last had Percocet 10-325 mg around 11 PM last night without sustained relief.  He characterizes his pain as constant and throbbing.  Pain intensity is 9/10. Patient denies fever, chills, sick contacts, recent travel, or exposure to COVID-19.  He also denies dizziness, paresthesias, persistent cough, sore throat, shortness of breath, chest pain, nausea, vomiting, or diarrhea.  [facility] Course: Patient admitted to sickle  cell day infusion center for management of pain crisis. All laboratory values reviewed, consistent with patient's baseline. Patient afebrile and maintaining oxygen saturation above 90% on RA. Pain managed with IV Dilaudid via PCA per  weight-based protocol.  Settings of 0.5 mg, 10-minute lockout, and 3 mg/h. Tylenol 1000 mg by mouth x1 IV fluids, 0.45% saline at 100 mL/h Pain intensity decreased to 5/10.  Patient does not warrant admission at this time.  Advised to return to clinic if pain persists. Patient is alert, oriented, and ambulating without assistance. He will discharge home in a hemodynamically stable condition.  Discharge instructions: Resume all home medications.  Follow up with PCP as previously  scheduled.   Discussed the importance of drinking 64 ounces of water daily to  help prevent pain crises, it is important to drink plenty of water throughout the day. This is because dehydration of red blood cells may lead further sickling.   Avoid all stressors that precipitate sickle cell pain crisis.     The patient was given clear instructions to go to ER or return to medical center if symptoms do not improve, worsen or new problems develop.     Physical Exam at Discharge:  BP 111/75 (BP Location: Right Arm)   Pulse 82   Temp 98 F (36.7 C) (Oral)   Resp 10   SpO2 96%  Physical Exam Constitutional:      Appearance: Normal appearance.  Eyes:     Pupils: Pupils are equal, round, and reactive to light.  Cardiovascular:     Rate and Rhythm: Normal rate and regular rhythm.  Pulmonary:     Effort: Pulmonary effort is normal.     Breath sounds: Normal breath sounds.  Abdominal:     General: Abdomen is flat.  Musculoskeletal: Normal range of motion.  Skin:    General: Skin is warm.  Neurological:     General: No focal deficit present.     Mental Status: He is alert. Mental status is at baseline.  Psychiatric:        Mood and Affect: Mood normal.        Behavior: Behavior normal.        Thought Content: Thought content normal.        Judgment: Judgment normal.     Disposition at Discharge: Discharge disposition: 01-Home or Self Care       Discharge Orders: Discharge Instructions     Discharge patient   Complete by: As directed    Discharge disposition: 01-Home or Self Care   Discharge patient date: 01/17/2019      Condition at Discharge:   Stable  Time spent on Discharge:  Greater than 30 minutes.  Signed: [name]  APRN, [name], [name] Patient Care [facility] [address] [place], [place] [address] [phone]  01/17/2019, 4:08 PM

## 2019-01-17 NOTE — ED Notes (Signed)
Pt. was ambulated, pt O2 stayed between 90-94. No report of shortness of breath.

## 2019-01-17 NOTE — H&P (Signed)
[facility] History and Physical   Date: 01/17/2019  Patient name: Martin [name] Medical record number: [id number] Date of birth: 10-31-1990 Age: 28 y.o. Gender: male PCP: [name], [name]  Attending physician: Quentin Angst, MD  Chief Complaint: Sickle cell pain   History of Present Illness: Martin [name], a 28 year old male with a medical history significant for sickle cell disease, opiate dependence, opiate tolerance, chronic pain syndrome, HIV on antiretroviral therapy, GERD, and history of generalized anxiety disorder presents complaining of low back pain that is consistent with typical sickle cell crisis.  Patient was seen in primary care on 01/16/2019 and was complaining of intense pain primarily to lower back that was unrelieved by home medications.  Patient was offered hydration at that time, but declined.  Patient was advised to continue opiate medication regimen and hydrate consistently.  He last had Percocet 10-325 mg/h around 11 PM last night without sustained relief.  Current pain intensity is 9/10 characterized as constant and throbbing. Patient denies sick contacts, recent travel, or exposure to COVID-19.  He also denies fever, chills, headache, shortness of breath, sore throat, persistent cough, dizziness, dysuria, paresthesias, nausea, vomiting, or diarrhea.  Meds: Medications Prior to Admission  Medication Sig Dispense Refill Last Dose  . abacavir-dolutegravir-lamiVUDine (TRIUMEQ) 600-50-300 MG tablet Take 1 tablet by mouth daily.     Marland Kitchen ALPRAZolam (XANAX) 1 MG tablet Take 1 tablet (1 mg total) by mouth 2 (two) times daily as needed for anxiety. 30 tablet 0   . cetirizine (ZYRTEC) 10 MG chewable tablet Chew 10 mg by mouth daily as needed for allergies.      . diphenhydrAMINE (BENADRYL) 25 MG tablet Take 25 mg by mouth every 6 (six) hours as needed for allergies.     . hydroxyurea (HYDREA) 500 MG capsule Take 4 capsules (2,000 mg total) by  mouth at bedtime. 120 capsule 3   . Multiple Vitamin (MULTIVITAMIN WITH MINERALS) TABS tablet Take 1 tablet by mouth daily.     . naloxone (NARCAN) 0.4 MG/ML injection As needed. (Patient taking differently: Inject 0.4 mg into the muscle daily as needed (overdose). ) 1 mL 2   . oxyCODONE-acetaminophen (PERCOCET) 10-325 MG tablet Take 1 tablet by mouth every 4 (four) hours as needed for pain. 90 tablet 0   . Vitamin D, Ergocalciferol, (DRISDOL) 1.25 MG (50000 UT) CAPS capsule Take 1 capsule (50,000 Units total) by mouth every 7 (seven) days. (Patient not taking: Reported on 01/16/2019) 5 capsule 3     Allergies: Ketorolac Past Medical History:  Diagnosis Date  . Anxiety   . HIV (human immunodeficiency virus infection) (HCC)   . Sickle cell crisis (HCC)   . 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HCC)   . Vitamin D deficiency 10/2018   No past surgical history on file. Family History  Problem Relation Age of Onset  . Sickle cell trait Mother   . Sickle cell trait Father   . Birth defects Maternal Grandmother   . Birth defects Paternal Grandmother    Social History   Socioeconomic History  . Marital status: Single    Spouse name: Not on file  . Number of children: Not on file  . Years of education: Not on file  . Highest education level: Not on file  Occupational History  . Not on file  Social Needs  . Financial resource strain: Not on file  . Food insecurity    Worry: Not on file    Inability: Not  on file  . Transportation needs    Medical: Not on file    Non-medical: Not on file  Tobacco Use  . Smoking status: Never Smoker  . Smokeless tobacco: Never Used  Substance and Sexual Activity  . Alcohol use: No  . Drug use: No  . Sexual activity: Yes  Lifestyle  . Physical activity    Days per week: Not on file    Minutes per session: Not on file  . Stress: Not on file  Relationships  . Social Musician on phone: Not on file    Gets together: Not on file    Attends religious  service: Not on file    Active member of club or organization: Not on file    Attends meetings of clubs or organizations: Not on file    Relationship status: Not on file  . Intimate partner violence    Fear of current or ex partner: Not on file    Emotionally abused: Not on file    Physically abused: Not on file    Forced sexual activity: Not on file  Other Topics Concern  . Not on file  Social History Narrative  . Not on file   Review of Systems  Constitutional: Negative for chills and fever.  HENT: Negative.   Eyes: Negative.   Respiratory: Negative for sputum production and shortness of breath.   Cardiovascular: Negative.   Gastrointestinal: Negative.   Genitourinary: Negative.   Musculoskeletal: Positive for back pain and joint pain.  Skin: Negative.   Neurological: Negative.   Endo/Heme/Allergies: Negative.   Psychiatric/Behavioral: Negative.     Physical Exam: There were no vitals taken for this visit.  BP 110/62 (BP Location: Right Arm)   Pulse 68   Temp 98 F (36.7 C) (Oral)   Resp 16   SpO2 92%   General Appearance:    Alert, cooperative, no distress, appears stated age  Head:    Normocephalic, without obvious abnormality, atraumatic  Eyes:    PERRL, conjunctiva/corneas clear, EOM's intact, fundi    benign, both eyes       Ears:    Normal TM's and external ear canals, both ears  Nose:   Nares normal, septum midline, mucosa normal, no drainage   or sinus tenderness  Throat:   Lips, mucosa, and tongue normal; teeth and gums normal  Neck:   Supple, symmetrical, trachea midline, no adenopathy;       thyroid:  No enlargement/tenderness/nodules; no carotid   bruit or JVD  Back:     Symmetric, no curvature, ROM normal, no CVA tenderness  Lungs:     Clear to auscultation bilaterally, respirations unlabored  Chest wall:    No tenderness or deformity  Heart:    Regular rate and rhythm, S1 and S2 normal, no murmur, rub   or gallop  Abdomen:     Soft, non-tender,  bowel sounds active all four quadrants,    no masses, no organomegaly  Extremities:   Extremities normal, atraumatic, no cyanosis or edema  Pulses:   2+ and symmetric all extremities  Skin:   Skin color, texture, turgor normal, no rashes or lesions  Lymph nodes:   Cervical, supraclavicular, and axillary nodes normal  Neurologic:   CNII-XII intact. Normal strength, sensation and reflexes      throughout   Lab results: Results for orders placed or performed during the hospital encounter of 01/16/19 (from the past 24 hour(s))  Comprehensive metabolic panel  Status: Abnormal   Collection Time: 01/16/19  7:36 PM  Result Value Ref Range   Sodium 140 135 - 145 mmol/L   Potassium 3.3 (L) 3.5 - 5.1 mmol/L   Chloride 108 98 - 111 mmol/L   CO2 22 22 - 32 mmol/L   Glucose, Bld 125 (H) 70 - 99 mg/dL   BUN 9 6 - 20 mg/dL   Creatinine, Ser 0.84 0.61 - 1.24 mg/dL   Calcium 8.8 (L) 8.9 - 10.3 mg/dL   Total Protein 8.5 (H) 6.5 - 8.1 g/dL   Albumin 3.9 3.5 - 5.0 g/dL   AST 34 15 - 41 U/L   ALT 21 0 - 44 U/L   Alkaline Phosphatase 55 38 - 126 U/L   Total Bilirubin 7.3 (H) 0.3 - 1.2 mg/dL   GFR calc non Af Amer >60 >60 mL/min   GFR calc Af Amer >60 >60 mL/min   Anion gap 10 5 - 15  CBC with Differential     Status: Abnormal   Collection Time: 01/16/19  7:36 PM  Result Value Ref Range   WBC 8.8 4.0 - 10.5 K/uL   RBC 2.32 (L) 4.22 - 5.81 MIL/uL   Hemoglobin 10.1 (L) 13.0 - 17.0 g/dL   HCT 27.4 (L) 39.0 - 52.0 %   MCV 118.1 (H) 80.0 - 100.0 fL   MCH 43.5 (H) 26.0 - 34.0 pg   MCHC 36.9 (H) 30.0 - 36.0 g/dL   RDW 21.1 (H) 11.5 - 15.5 %   Platelets 318 150 - 400 K/uL   nRBC 13.7 (H) 0.0 - 0.2 %   Neutrophils Relative % 51 %   Neutro Abs 4.5 1.7 - 7.7 K/uL   Lymphocytes Relative 43 %   Lymphs Abs 3.8 0.7 - 4.0 K/uL   Monocytes Relative 6 %   Monocytes Absolute 0.5 0.1 - 1.0 K/uL   Eosinophils Relative 0 %   Eosinophils Absolute 0.0 0.0 - 0.5 K/uL   Basophils Relative 0 %   Basophils  Absolute 0.0 0.0 - 0.1 K/uL   nRBC 39 (H) 0 /100 WBC   Abs Immature Granulocytes 0.00 0.00 - 0.07 K/uL   Polychromasia MARKED    Basophilic Stippling PRESENT    Sickle Cells PRESENT   Reticulocytes     Status: None   Collection Time: 01/16/19  7:36 PM  Result Value Ref Range   Retic Ct Pct RESULTS UNAVAILABLE DUE TO INTERFERING SUBSTANCE 0.4 - 3.1 %   RBC. NOT CALCULATED 4.22 - 5.81 MIL/uL   Retic Count, Absolute NOT CALCULATED 19.0 - 186.0 K/uL   Immature Retic Fract NOT CALCULATED 2.3 - 15.9 %    Imaging results:  Dg Chest Port 1 View  Result Date: 01/17/2019 CLINICAL DATA:  Chest pain, sickle cell disease EXAM: PORTABLE CHEST 1 VIEW COMPARISON:  Most recent radiograph 04/29/2018 FINDINGS: Chronic scarring and interstitial changes predominantly throughout the lung bases. Overall appearance and distribution is similar to comparison studies. No focal consolidative process, pneumothorax or effusion. Cardiomediastinal contours are similar to priors accounting for differences in technique. A right upper internal jugular approach Port-A-Cath tip terminates at the superior cavoatrial junction. No acute osseous or soft tissue abnormality. IMPRESSION: 1. No active cardiopulmonary disease. 2. Chronic basilar predominant scarring and interstitial changes, similar to comparison studies and compatible with history of sickle cell disease. Electronically Signed   By: Lovena Le M.D.   On: 01/17/2019 01:45     Assessment & Plan: Patient admitted to sickle cell day  infusion center for management of pain crisis.  He is opiate tolerant.  Initiate 0.45% saline at 100 ml/hour for cellular rehydration Tylenol 1000 mg times one Start Dilaudid PCA High Concentration per weight based protocol.    Settings of 0.5 mg, 10-minute lockout, and 3 mg/h. Patient will be re-evaluated for pain intensity in the context of function and relationship to baseline as care progresses. If no significant pain relief, will  transfer patient to inpatient services for a higher level of care.   Reviewed CBC with differential and CMP from 01/17/2019.  Patient's hemoglobin was 10.1, which was consistent with his baseline.  CBC does not warrant repeating on today.  Patient also had a complete metabolic panel, potassium was 3.3.  Will repeat BMP today.    Nolon NationsLachina Moore Anh Mangano  APRN, [name], [name] Patient Care [facility] [address]  [place], [place] [id number] [phone]  01/17/2019, 9:53 AM

## 2019-01-17 NOTE — Discharge Instructions (Addendum)
Continue your home medications. Follow up with your doctor for recheck as needed.

## 2019-01-17 NOTE — ED Provider Notes (Signed)
[facility] EMERGENCY DEPARTMENT Provider Note   CSN: [id number] Arrival date & time: 01/16/19  1830     History   Chief Complaint Chief Complaint  Patient presents with  . Sickle Cell Pain Crisis    HPI Martin [name] is a 28 y.o. male.     Patient with history of HIV, sickle cell disease presents with generalized pain, including left sided chest pain, that started 2 days ago. He reports his typical pain crises is generalized but not to chest. No fever, cough, congestion, sore throat. He denies difficulty breathing, increase to chest pain with breathing or SOB/DOE. No nausea, vomiting. He has home medications that he is taking regularly without relief.   The history is provided by the patient. No language interpreter was used.  Sickle Cell Pain Crisis Associated symptoms: chest pain   Associated symptoms: no cough, no fever, no nausea and no shortness of breath     Past Medical History:  Diagnosis Date  . Anxiety   . HIV (human immunodeficiency virus infection) ([place])   . Sickle cell crisis ([place])   . 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[place])   . Vitamin D deficiency 10/2018    Patient Active Problem List   Diagnosis Date Noted  . Heart murmur 02/03/2017  . Sickle cell disease with crisis ([place]) 02/02/2017  . 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 Sickle cell pain crisis ([place]) 02/02/2017  . 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[place]) 12/20/2016  . Vitamin D deficiency 08/14/2016  . High risk medication use 09/27/2014  . Generalized anxiety disorder 05/19/2014  . GERD (gastroesophageal reflux disease) 05/19/2014  . HIV (human immunodeficiency virus infection) ([place]) 05/23/2012    History reviewed. No pertinent surgical history.      Home Medications    Prior to Admission medications   Medication Sig Start Date End Date Taking? Authorizing Provider  abacavir-dolutegravir-lamiVUDine (TRIUMEQ) 600-50-300 MG tablet Take 1 tablet by mouth daily.    [provider]   ALPRAZolam Duanne Moron) 1 MG tablet Take 1 tablet (1 mg total) by mouth 2 (two) times daily as needed for anxiety. 12/16/18   Azzie Glatter, [name]  cetirizine (ZYRTEC) 10 MG chewable tablet Chew 10 mg by mouth daily.    [provider]  diphenhydrAMINE (BENADRYL) 25 MG tablet Take 25 mg by mouth every 6 (six) hours as needed for allergies.    [provider]  hydroxyurea (HYDREA) 500 MG capsule Take 4 capsules (2,000 mg total) by mouth at bedtime. 11/15/18   Azzie Glatter, [name]  Multiple Vitamin (MULTIVITAMIN WITH MINERALS) TABS tablet Take 1 tablet by mouth daily.    [provider]  naloxone [facility]) 0.4 MG/ML injection As needed. 11/28/18   Azzie Glatter, [name]  oxyCODONE-acetaminophen (PERCOCET) 10-325 MG tablet Take 1 tablet by mouth every 4 (four) hours as needed for pain. 01/06/19   Azzie Glatter, [name]  Vitamin D, Ergocalciferol, (DRISDOL) 1.25 MG (50000 UT) CAPS capsule Take 1 capsule (50,000 Units total) by mouth every 7 (seven) days. Patient not taking: Reported on 01/16/2019 11/01/18   Azzie Glatter, [name]    Family History Family History  Problem Relation Age of Onset  . Sickle cell trait Mother   . Sickle cell trait Father   . Birth defects Maternal Grandmother   . Birth defects Paternal Grandmother     Social History Social History   Tobacco Use  . Smoking status: Never Smoker  . Smokeless tobacco: Never Used  Substance Use Topics  .  Alcohol use: No  . Drug use: No     Allergies   Ketorolac   Review of Systems Review of Systems  Constitutional: Negative for chills, diaphoresis and fever.  HENT: Negative.   Respiratory: Negative.  Negative for cough and shortness of breath.   Cardiovascular: Positive for chest pain.  Gastrointestinal: Negative.  Negative for abdominal pain and nausea.  Musculoskeletal: Positive for arthralgias and myalgias.  Skin: Negative.   Neurological: Negative.      Physical Exam Updated Vital Signs BP  110/60   Pulse 68   Temp 98.7 F (37.1 C)   Resp 18   SpO2 94%   Physical Exam Vitals signs and nursing note reviewed.  Constitutional:      Appearance: He is well-developed.  HENT:     Head: Normocephalic.  Neck:     Musculoskeletal: Normal range of motion and neck supple.  Cardiovascular:     Rate and Rhythm: Normal rate and regular rhythm.  Pulmonary:     Effort: Pulmonary effort is normal.     Breath sounds: Normal breath sounds. No wheezing, rhonchi or rales.  Abdominal:     General: Bowel sounds are normal.     Palpations: Abdomen is soft.     Tenderness: There is no abdominal tenderness. There is no guarding or rebound.  Musculoskeletal: Normal range of motion.     Comments: No joint swelling or redness. FROM all extremities. No focal tenderness.   Skin:    General: Skin is warm and dry.  Neurological:     Mental Status: He is alert and oriented to person, place, and time.      ED Treatments / Results  Labs (all labs ordered are listed, but only abnormal results are displayed) Labs Reviewed  COMPREHENSIVE METABOLIC PANEL - Abnormal; Notable for the following components:      Result Value   Potassium 3.3 (*)    Glucose, Bld 125 (*)    Calcium 8.8 (*)    Total Protein 8.5 (*)    Total Bilirubin 7.3 (*)    All other components within normal limits  CBC WITH DIFFERENTIAL/PLATELET - Abnormal; Notable for the following components:   RBC 2.32 (*)    Hemoglobin 10.1 (*)    HCT 27.4 (*)    MCV 118.1 (*)    MCH 43.5 (*)    MCHC 36.9 (*)    RDW 21.1 (*)    nRBC 13.7 (*)    nRBC 39 (*)    All other components within normal limits  RETICULOCYTES    EKG None  Radiology No results found.  Procedures Procedures (including critical care time)  Medications Ordered in ED Medications  sodium chloride flush (NS) 0.9 % injection 3 mL (3 mLs Intravenous Not Given 01/17/19 0113)  HYDROmorphone (DILAUDID) injection 2 mg (has no administration in time range)    Or   HYDROmorphone (DILAUDID) injection 2 mg (has no administration in time range)  HYDROmorphone (DILAUDID) injection 2 mg (has no administration in time range)    Or  HYDROmorphone (DILAUDID) injection 2 mg (has no administration in time range)  HYDROmorphone (DILAUDID) injection 2 mg (has no administration in time range)    Or  HYDROmorphone (DILAUDID) injection 2 mg (has no administration in time range)  diphenhydrAMINE (BENADRYL) capsule 25-50 mg (has no administration in time range)     Initial Impression / Assessment and Plan / ED Course  I have reviewed the triage vital signs and the nursing notes.  Pertinent labs & imaging results that were available during my care of the patient were reviewed by me and considered in my medical decision making (see chart for details).        Patient to ED with all-over body pain, worst in the left chest. No fever, cough, SOB, vomiting. Home medications not effective.   He is nontoxic in appearance. VSS. 3 doses Dilaudid provided IV with PO Benadryl for itching. He is allergic to Toradol. After pain medication dosing he reports he is feeling better, still having pain. Additional dose Dilaudid provided.   His O2 saturations have been around 90%. He continues to deny SOB. He ambulates well, without new symptoms and maintains 90-94% oxygenation. No SOB.   CXR clear, without evidence acute chest. No pleuritic component to CP, no SOB, no tachycardia. Doubt PE.   He feels better enough and comfortable with discharge home.   Final Clinical Impressions(s) / ED Diagnoses   Final diagnoses:  None   1. Sickle cell anemia with pain  ED Discharge Orders    None       Elpidio Anis, PA-C 01/17/19 0533    Gilda Crease, MD 01/18/19 [phone]

## 2019-01-17 NOTE — Progress Notes (Signed)
Patient admitted to the day infusion hospital for sickle cell pain crisis. Initially, patient reported lower back pain rated 9/10. For pain management, patient placed on Dilaudid PCA, given 1000 mg Tylenol and hydrated with IV fluids. At discharge, patient rated pain at 5/10. Vital signs stable. Discharge instructions given. Patient alert, oriented and ambulatory at discharge.

## 2019-01-17 NOTE — Telephone Encounter (Signed)
Patient called requesting to come to the day hospital for sickle cell pain. Patient reports generalized pain which is worse in lower back rated 9/10. Reports taking Percocet for pain at 11:00 last night. COVID-19 screening done and patient denies all symptoms. Denies fever, nausea, vomiting, diarrhea, abdominal pain and priapism. Admits to having "slight" left upper chest pain but reports that this occurs some times with his crisis. Upper will transport patient at discharge. [place], [place] notified and patient can come to the day hospital for pain management. Patient advised and expresses an understanding.

## 2019-01-17 NOTE — Discharge Instructions (Signed)
Sickle Cell Anemia, Adult ° °Sickle cell anemia is a condition where your red blood cells are shaped like sickles. Red blood cells carry oxygen through the body. Sickle-shaped cells do not live as long as normal red blood cells. They also clump together and block blood from flowing through the blood vessels. This prevents the body from getting enough oxygen. Sickle cell anemia causes organ damage and pain. It also increases the risk of infection. °Follow these instructions at home: °Medicines °· Take over-the-counter and prescription medicines only as told by your doctor. °· If you were prescribed an antibiotic medicine, take it as told by your doctor. Do not stop taking the antibiotic even if you start to feel better. °· If you develop a fever, do not take medicines to lower the fever right away. Tell your doctor about the fever. °Managing pain, stiffness, and swelling °· Try these methods to help with pain: °? Use a heating pad. °? Take a warm bath. °? Distract yourself, such as by watching TV. °Eating and drinking °· Drink enough fluid to keep your pee (urine) clear or pale yellow. Drink more in hot weather and during exercise. °· Limit or avoid alcohol. °· Eat a healthy diet. Eat plenty of fruits, vegetables, whole grains, and lean protein. °· Take vitamins and supplements as told by your doctor. °Traveling °· When traveling, keep these with you: °? Your medical information. °? The names of your doctors. °? Your medicines. °· If you need to take an airplane, talk to your doctor first. °Activity °· Rest often. °· Avoid exercises that make your heart beat much faster, such as jogging. °General instructions °· Do not use products that have nicotine or tobacco, such as cigarettes and e-cigarettes. If you need help quitting, ask your doctor. °· Consider wearing a medical alert bracelet. °· Avoid being in high places (high altitudes), such as mountains. °· Avoid very hot or cold temperatures. °· Avoid places where the  temperature changes a lot. °· Keep all follow-up visits as told by your doctor. This is important. °Contact a doctor if: °· A joint hurts. °· Your feet or hands hurt or swell. °· You feel tired (fatigued). °Get help right away if: °· You have symptoms of infection. These include: °? Fever. °? Chills. °? Being very tired. °? Irritability. °? Poor eating. °? Throwing up (vomiting). °· You feel dizzy or faint. °· You have new stomach pain, especially on the left side. °· You have a an erection (priapism) that lasts more than 4 hours. °· You have numbness in your arms or legs. °· You have a hard time moving your arms or legs. °· You have trouble talking. °· You have pain that does not go away when you take medicine. °· You are short of breath. °· You are breathing fast. °· You have a long-term cough. °· You have pain in your chest. °· You have a bad headache. °· You have a stiff neck. °· Your stomach looks bloated even though you did not eat much. °· Your skin is pale. °· You suddenly cannot see well. °Summary °· Sickle cell anemia is a condition where your red blood cells are shaped like sickles. °· Follow your doctor's advice on ways to manage pain, food to eat, activities to do, and steps to take for safe travel. °· Get medical help right away if you have any signs of infection, such as a fever. °This information is not intended to replace advice given to you by   your health care provider. Make sure you discuss any questions you have with your health care provider. °Document Released: 01/25/2013 Document Revised: 07/29/2018 Document Reviewed: 05/12/2016 °Elsevier Patient Education © 2020 [employer] ° °

## 2019-01-18 ENCOUNTER — Other Ambulatory Visit: Payer: Self-pay | Admitting: Family Medicine

## 2019-01-18 ENCOUNTER — Telehealth: Payer: Self-pay

## 2019-01-18 DIAGNOSIS — G8929 Other chronic pain: Secondary | ICD-10-CM

## 2019-01-18 DIAGNOSIS — D571 Sickle-cell disease without crisis: Secondary | ICD-10-CM

## 2019-01-18 MED ORDER — OXYCODONE-ACETAMINOPHEN 10-325 MG PO TABS: 1.0000 | ORAL_TABLET | ORAL | 0 refills | Status: DC | PRN

## 2019-01-18 NOTE — Telephone Encounter (Signed)
-----   Message from Azzie Glatter, [place] sent at 01/18/2019  1:27 PM EDT ----- Regarding: "Refill" Refill for Percocet sent to pharmacy today. He will be able to pick up on tomorrow, 01/19/2019. Please inform patient. Thank you.

## 2019-01-18 NOTE — Telephone Encounter (Signed)
Pt was notified of his rx , this was sent to his pharmacy. Called pt.

## 2019-01-19 ENCOUNTER — Telehealth: Payer: Self-pay | Admitting: Family Medicine

## 2019-01-19 ENCOUNTER — Other Ambulatory Visit: Payer: Self-pay | Admitting: Family Medicine

## 2019-01-19 DIAGNOSIS — D571 Sickle-cell disease without crisis: Secondary | ICD-10-CM

## 2019-01-19 DIAGNOSIS — G8929 Other chronic pain: Secondary | ICD-10-CM

## 2019-01-19 MED ORDER — OXYCODONE-ACETAMINOPHEN 10-325 MG PO TABS: 1.0000 | ORAL_TABLET | ORAL | 0 refills | Status: DC | PRN

## 2019-01-19 MED FILL — OXYCODONE-APAP 10-325: 10-325 | 15 days supply | Qty: 90 | Fill #0

## 2019-01-19 NOTE — Telephone Encounter (Signed)
Refill request for oxycodone.  

## 2019-01-19 NOTE — Telephone Encounter (Signed)
Patient aware.

## 2019-02-01 ENCOUNTER — Telehealth: Payer: Self-pay | Admitting: Family Medicine

## 2019-02-01 ENCOUNTER — Other Ambulatory Visit: Payer: Self-pay | Admitting: Family Medicine

## 2019-02-01 DIAGNOSIS — D571 Sickle-cell disease without crisis: Secondary | ICD-10-CM

## 2019-02-01 DIAGNOSIS — G8929 Other chronic pain: Secondary | ICD-10-CM

## 2019-02-01 MED ORDER — OXYCODONE-ACETAMINOPHEN 10-325 MG PO TABS: 1.0000 | ORAL_TABLET | ORAL | 0 refills | Status: DC | PRN

## 2019-02-01 NOTE — Telephone Encounter (Signed)
Refill request for oxycodone. Please advise.  

## 2019-02-09 ENCOUNTER — Inpatient Hospital Stay (HOSPITAL_COMMUNITY)
Admission: EM | Admit: 2019-02-09 | Discharge: 2019-02-15 | DRG: 175 | Discharge status: Against Medical Advice | Payer: Medicaid - Out of State | Attending: Internal Medicine | Admitting: Internal Medicine

## 2019-02-09 ENCOUNTER — Encounter (HOSPITAL_COMMUNITY): Payer: Self-pay | Admitting: Emergency Medicine

## 2019-02-09 ENCOUNTER — Telehealth (HOSPITAL_COMMUNITY): Payer: Self-pay

## 2019-02-09 ENCOUNTER — Other Ambulatory Visit: Payer: Self-pay

## 2019-02-09 ENCOUNTER — Emergency Department (HOSPITAL_COMMUNITY): Payer: Medicaid - Out of State

## 2019-02-09 DIAGNOSIS — E559 Vitamin D deficiency, unspecified: Secondary | ICD-10-CM | POA: Diagnosis present

## 2019-02-09 DIAGNOSIS — F411 Generalized anxiety disorder: Secondary | ICD-10-CM | POA: Diagnosis present

## 2019-02-09 DIAGNOSIS — D539 Nutritional anemia, unspecified: Secondary | ICD-10-CM | POA: Diagnosis present

## 2019-02-09 DIAGNOSIS — M545 Low back pain: Secondary | ICD-10-CM | POA: Diagnosis present

## 2019-02-09 DIAGNOSIS — I2693 Single subsegmental pulmonary embolism without acute cor pulmonale: Secondary | ICD-10-CM | POA: Diagnosis not present

## 2019-02-09 DIAGNOSIS — I2699 Other pulmonary embolism without acute cor pulmonale: Secondary | ICD-10-CM | POA: Diagnosis present

## 2019-02-09 DIAGNOSIS — D5701 Hb-SS disease with acute chest syndrome: Secondary | ICD-10-CM

## 2019-02-09 DIAGNOSIS — B20 Human immunodeficiency virus [HIV] disease: Secondary | ICD-10-CM | POA: Diagnosis present

## 2019-02-09 DIAGNOSIS — Z20828 Contact with and (suspected) exposure to other viral communicable diseases: Secondary | ICD-10-CM | POA: Diagnosis present

## 2019-02-09 DIAGNOSIS — Z79899 Other long term (current) drug therapy: Secondary | ICD-10-CM

## 2019-02-09 DIAGNOSIS — Z832 Family history of diseases of the blood and blood-forming organs and certain disorders involving the immune mechanism: Secondary | ICD-10-CM

## 2019-02-09 DIAGNOSIS — K5900 Constipation, unspecified: Secondary | ICD-10-CM | POA: Diagnosis present

## 2019-02-09 DIAGNOSIS — F419 Anxiety disorder, unspecified: Secondary | ICD-10-CM | POA: Diagnosis present

## 2019-02-09 DIAGNOSIS — D5700 Hb-SS disease with crisis, unspecified: Secondary | ICD-10-CM | POA: Diagnosis present

## 2019-02-09 DIAGNOSIS — G894 Chronic pain syndrome: Secondary | ICD-10-CM | POA: Diagnosis present

## 2019-02-09 DIAGNOSIS — Z21 Asymptomatic human immunodeficiency virus [HIV] infection status: Secondary | ICD-10-CM | POA: Diagnosis present

## 2019-02-09 DIAGNOSIS — D638 Anemia in other chronic diseases classified elsewhere: Secondary | ICD-10-CM | POA: Diagnosis present

## 2019-02-09 LAB — COMPREHENSIVE METABOLIC PANEL
ALT: 15 U/L (ref 0–44)
AST: 21 U/L (ref 15–41)
Albumin: 3.9 g/dL (ref 3.5–5.0)
Alkaline Phosphatase: 58 U/L (ref 38–126)
Anion gap: 8 (ref 5–15)
BUN: 9 mg/dL (ref 6–20)
CO2: 24 mmol/L (ref 22–32)
Calcium: 8.9 mg/dL (ref 8.9–10.3)
Chloride: 106 mmol/L (ref 98–111)
Creatinine, Ser: 0.92 mg/dL (ref 0.61–1.24)
GFR calc Af Amer: >60 mL/min (ref >60)
GFR calc non Af Amer: >60 mL/min (ref >60)
Glucose, Bld: 99 mg/dL (ref 70–99)
Potassium: 3.8 mmol/L (ref 3.5–5.1)
Sodium: 138 mmol/L (ref 135–145)
Total Bilirubin: 5.8 mg/dL — ABNORMAL HIGH (ref 0.3–1.2)
Total Protein: 8.5 g/dL — ABNORMAL HIGH (ref 6.5–8.1)

## 2019-02-09 LAB — TROPONIN I (HIGH SENSITIVITY): Troponin I (High Sensitivity): <2 ng/L (ref <18)

## 2019-02-09 MED ORDER — HYDROMORPHONE HCL 2 MG/ML IJ SOLN
2.0000 mg | INTRAMUSCULAR | Status: AC
  Administered 2019-02-10 (×2): 2 mg via INTRAVENOUS
  Filled 2019-02-09 (×2): qty 1

## 2019-02-09 MED ORDER — SODIUM CHLORIDE 0.9% FLUSH
3.0000 mL | Freq: Once | INTRAVENOUS | Status: AC
  Administered 2019-02-09: 3 mL via INTRAVENOUS

## 2019-02-09 MED ORDER — ONDANSETRON HCL 4 MG/2ML IJ SOLN
4.0000 mg | INTRAMUSCULAR | Status: DC | PRN
  Administered 2019-02-10: 4 mg via INTRAVENOUS
  Filled 2019-02-09: qty 2

## 2019-02-09 MED ORDER — SODIUM CHLORIDE 0.45 % IV SOLN
INTRAVENOUS | Status: DC
  Administered 2019-02-10: via INTRAVENOUS

## 2019-02-09 MED ORDER — HYDROMORPHONE HCL 2 MG/ML IJ SOLN: 2.0000 mg | INTRAMUSCULAR | Status: AC

## 2019-02-09 MED ORDER — HYDROMORPHONE HCL 2 MG/ML IJ SOLN
2.0000 mg | INTRAMUSCULAR | Status: AC
  Administered 2019-02-10: 2 mg via INTRAVENOUS
  Filled 2019-02-09: qty 1

## 2019-02-09 MED ORDER — DIPHENHYDRAMINE HCL 25 MG PO CAPS
25.0000 mg | ORAL_CAPSULE | ORAL | Status: DC | PRN
  Administered 2019-02-10: 50 mg via ORAL
  Filled 2019-02-09: qty 2

## 2019-02-09 NOTE — ED Notes (Signed)
Pt taken to xray 

## 2019-02-09 NOTE — ED Notes (Signed)
Pt called for for their EKG to be completed from lobby. MRN placed in EKG machine, EKG taken and given to Dr. Roderic Palau.

## 2019-02-09 NOTE — ED Provider Notes (Signed)
[facility]-EMERGENCY DEPT Provider Note   CSN: [id number] Arrival date & time: 02/09/19  2008     History   Chief Complaint Chief Complaint  Patient presents with  . Sickle Cell Pain Crisis  . Chest Pain    HPI Martin [name] is a 28 y.o. male.     She with past medical history notable for sickle cell, HIV, presents to the emergency department with a chief complaint of chest pain and low back pain.  He reports that this feels like his sickle cell pain in his back, but the chest pain is new.  He reports having taken his regular Percocet with no relief.  He has been trying to stay hydrated.  He denies any red or swollen joints.  He wears oxygen at night, and chronically has low O2, usually running in the low 90s.  He denies any fevers chills.  Denies any other associated symptoms.  Rates his pain as severe.  The history is provided by the patient. No language interpreter was used.    Past Medical History:  Diagnosis Date  . Anxiety   . HIV (human immunodeficiency virus infection) (HCC)   . Sickle cell crisis (HCC)   . 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HCC)   . Vitamin D deficiency 10/2018    Patient Active Problem List   Diagnosis Date Noted  . Sickle cell crisis (HCC) 01/17/2019  . Heart murmur 02/03/2017  . Sickle cell disease with crisis (HCC) 02/02/2017  . 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 Sickle cell pain crisis (HCC) 02/02/2017  . 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HCC) 12/20/2016  . Vitamin D deficiency 08/14/2016  . High risk medication use 09/27/2014  . Generalized anxiety disorder 05/19/2014  . GERD (gastroesophageal reflux disease) 05/19/2014  . HIV (human immunodeficiency virus infection) (HCC) 05/23/2012    History reviewed. No pertinent surgical history.      Home Medications    Prior to Admission medications   Medication Sig Start Date End Date Taking? Authorizing Provider  abacavir-dolutegravir-lamiVUDine (TRIUMEQ) 600-50-300 MG tablet  Take 1 tablet by mouth daily.   Yes [provider]  ALPRAZolam Prudy Feeler) 1 MG tablet Take 1 tablet (1 mg total) by mouth 2 (two) times daily as needed for anxiety. 12/16/18  Yes [name], [name]  cetirizine (ZYRTEC) 10 MG chewable tablet Chew 10 mg by mouth daily as needed for allergies.    Yes [provider]  diphenhydrAMINE (BENADRYL) 25 MG tablet Take 25 mg by mouth every 6 (six) hours as needed for allergies.   Yes [provider]  hydroxyurea (HYDREA) 500 MG capsule Take 4 capsules (2,000 mg total) by mouth at bedtime. 11/15/18  Yes [name], [name]  Multiple Vitamin (MULTIVITAMIN WITH MINERALS) TABS tablet Take 1 tablet by mouth daily.   Yes [provider]  oxyCODONE-acetaminophen (PERCOCET) 10-325 MG tablet Take 1 tablet by mouth every 4 (four) hours as needed for up to 15 days for pain. 02/01/19 02/16/19 Yes [name], [name]  naloxone [facility]) 0.4 MG/ML injection As needed. Patient taking differently: Inject 0.4 mg into the muscle daily as needed (overdose).  11/28/18   [name], [name]  Vitamin D, Ergocalciferol, (DRISDOL) 1.25 MG (50000 UT) CAPS capsule Take 1 capsule (50,000 Units total) by mouth every 7 (seven) days. Patient not taking: Reported on 02/09/2019 11/01/18   [name], [name]    Family History Family History  Problem Relation Age of Onset  . Sickle cell trait Mother   .  Sickle cell trait Father   . Birth defects Maternal Grandmother   . Birth defects Paternal Grandmother     Social History Social History   Tobacco Use  . Smoking status: Never Smoker  . Smokeless tobacco: Never Used  Substance Use Topics  . Alcohol use: No  . Drug use: No     Allergies   Ketorolac   Review of Systems Review of Systems  All other systems reviewed and are negative.    Physical Exam Updated Vital Signs BP 105/71   Pulse 75   Temp 99.2 F (37.3 C) (Oral)   Resp (!) 23   Ht 6\' 3"  (1.905 m)   Wt 68 kg    SpO2 97%   BMI 18.75 kg/m   Physical Exam Vitals signs and nursing note reviewed.  Constitutional:      Appearance: He is well-developed.  HENT:     Head: Normocephalic and atraumatic.  Eyes:     Conjunctiva/sclera: Conjunctivae normal.  Neck:     Musculoskeletal: Neck supple.  Cardiovascular:     Rate and Rhythm: Normal rate and regular rhythm.     Heart sounds: No murmur.  Pulmonary:     Effort: Pulmonary effort is normal. No respiratory distress.     Breath sounds: Normal breath sounds.  Abdominal:     Palpations: Abdomen is soft.     Tenderness: There is no abdominal tenderness.  Musculoskeletal: Normal range of motion.  Skin:    General: Skin is warm and dry.  Neurological:     Mental Status: He is alert and oriented to person, place, and time.  Psychiatric:        Mood and Affect: Mood normal.        Behavior: Behavior normal.      ED Treatments / Results  Labs (all labs ordered are listed, but only abnormal results are displayed) Labs Reviewed  COMPREHENSIVE METABOLIC PANEL - Abnormal; Notable for the following components:      Result Value   Total Protein 8.5 (*)    Total Bilirubin 5.8 (*)    All other components within normal limits  CBC WITH DIFFERENTIAL/PLATELET  RETICULOCYTES  D-DIMER, QUANTITATIVE (NOT AT [facility])  TROPONIN I (HIGH SENSITIVITY)  TROPONIN I (HIGH SENSITIVITY)    EKG None  Radiology Dg Chest 2 View  Result Date: 02/09/2019 CLINICAL DATA:  Sickle cell crisis. Mid chest pain. EXAM: CHEST - 2 VIEW COMPARISON:  01/17/2019 FINDINGS: Right chest port remains in place with tip in the SVC. Chronic interstitial opacities and scarring, not significantly changed from prior exam. No acute airspace disease. Upper normal heart size with normal mediastinal contours. No pleural effusion or pneumothorax. No acute osseous abnormalities. IMPRESSION: Stable appearance of the chest with chronic interstitial opacities and scarring. No superimposed acute  abnormality. Electronically Signed   By: Narda RutherfordMelanie  Sanford M.D.   On: 02/09/2019 21:39   Ct Angio Chest Pe W And/or Wo Contrast  Result Date: 02/10/2019 CLINICAL DATA:  Chest pain, history of sickle cell EXAM: CT ANGIOGRAPHY CHEST WITH CONTRAST TECHNIQUE: Multidetector CT imaging of the chest was performed using the standard protocol during bolus administration of intravenous contrast. Multiplanar CT image reconstructions and MIPs were obtained to evaluate the vascular anatomy. CONTRAST:  100mL OMNIPAQUE IOHEXOL 350 MG/ML SOLN COMPARISON:  Radiograph same day FINDINGS: Cardiovascular: There is slightly suboptimal opacification of the main pulmonary artery. There is partially occlusive thrombus within the posterior right lower lobe segmental and subsegmental arterial branches. There is limited  visualization of the subsegmental branches however. There is mild cardiomegaly. No evidence of right ventricular heart strain. No pericardial effusion. There is normal three-vessel brachiocephalic anatomy without proximal stenosis. The thoracic aorta is normal in appearance. Mediastinum/Nodes: No hilar, mediastinal, or axillary adenopathy. Thyroid gland, trachea, and esophagus demonstrate no significant findings. Lungs/Pleura: There is patchy round nodular opacity seen in the posterior left upper lung and small ground-glass opacities within the anterior left lower lobe. There are increased interstitial opacities with bronchiectasis and ground-glass seen throughout both lung bases. No pleural effusion. Upper Abdomen: Coarse calcifications within the region of probable atrophy spleen. Musculoskeletal: No chest wall abnormality. No acute or significant osseous findings. Review of the MIP images confirms the above findings. IMPRESSION: 1. Slightly suboptimal opacification of the main pulmonary artery. Partially occlusive posterior right lower lobe segmental and subsegmental pulmonary embolism. 2. Peripheral nodular opacities  seen within the left upper lung. The findings could represent acute chest syndrome/infectious etiology. 3. Chronic interstitial lung changes/scarring at both lung bases. 4. These results were called by telephone at the time of interpretation on 02/10/2019 at 2:57 am to provider Roxy Horseman , who verbally acknowledged these results. Electronically Signed   By: Jonna Clark M.D.   On: 02/10/2019 03:03    Procedures Procedures (including critical care time) CRITICAL CARE Performed by: Roxy Horseman  Sickle cell crisis, multiple rechecks, pulmonary embolism requiring heparin infusion, acute chest syndrome requiring IV antibiotics and admission Total critical care time: 55 minutes  Critical care time was exclusive of separately billable procedures and treating other patients.  Critical care was necessary to treat or prevent imminent or life-threatening deterioration.  Critical care was time spent personally by me on the following activities: development of treatment plan with patient and/or surrogate as well as nursing, discussions with consultants, evaluation of patient's response to treatment, examination of patient, obtaining history from patient or surrogate, ordering and performing treatments and interventions, ordering and review of laboratory studies, ordering and review of radiographic studies, pulse oximetry and re-evaluation of patient's condition.  Medications Ordered in ED Medications  0.45 % sodium chloride infusion (has no administration in time range)  HYDROmorphone (DILAUDID) injection 2 mg (has no administration in time range)    Or  HYDROmorphone (DILAUDID) injection 2 mg (has no administration in time range)  HYDROmorphone (DILAUDID) injection 2 mg (has no administration in time range)    Or  HYDROmorphone (DILAUDID) injection 2 mg (has no administration in time range)  HYDROmorphone (DILAUDID) injection 2 mg (has no administration in time range)    Or  HYDROmorphone  (DILAUDID) injection 2 mg (has no administration in time range)  HYDROmorphone (DILAUDID) injection 2 mg (has no administration in time range)    Or  HYDROmorphone (DILAUDID) injection 2 mg (has no administration in time range)  diphenhydrAMINE (BENADRYL) capsule 25-50 mg (has no administration in time range)  ondansetron (ZOFRAN) injection 4 mg (has no administration in time range)  sodium chloride flush (NS) 0.9 % injection 3 mL (3 mLs Intravenous Given 02/09/19 2233)     Initial Impression / Assessment and Plan / ED Course  I have reviewed the triage vital signs and the nursing notes.  Pertinent labs & imaging results that were available during my care of the patient were reviewed by me and considered in my medical decision making (see chart for details).        Patient with chest pain and low back pain.  Reports that his low back pain feels like his  normal sickle cell pain.  States that his chest pain is unusual for him.  He wears oxygen at night, and his O2 sats generally run in the low 90s.  He is on 2 L of nasal cannula here maintaining high 90s.  He is afebrile.  Laboratory work-up is thus far reassuring.  Check D-dimer given new chest pain.  Treat pain and reassess.  D-dimer elevated, will proceed with CT chest.  CT chest consistent with PE and with nodular opacities in the left upper lung concerning for infection.  Leukocytosis to 14.0.  Afebrile in the ED.  Start heparin for PE, and antibiotics for possible infection.  Check blood cultures.  Covid test pending.  Pain improving with IV Dilaudid.  We will consult medicine for admission.  Appreciate Dr. Myna Hidalgo for admitting.  Final Clinical Impressions(s) / ED Diagnoses   Final diagnoses:  Acute pulmonary embolism without acute cor pulmonale, unspecified pulmonary embolism type ([place])  Acute chest syndrome due to sickle cell crisis [facility])    ED Discharge Orders    None       Montine Circle, PA-C 02/10/19 0402     Palumbo, April, MD 02/10/19 1194

## 2019-02-09 NOTE — Telephone Encounter (Signed)
Patient called requesting to come to the day hospital for sickle cell pain. Patient reported lower back pain and chest pain rated 10/10. Denied radiation down arm, neck, jaw and denied SOB. Reports last taking pain medication at 10/21 at 2300. Denies fever, chest pain, nausea, vomiting, diarrhea and abdominal pain. Admits to having means of transportation at discharge without driving herself. Provider notified and recommended pt hydrate with fluids, take oxycodone as prescribed as well as ibuprofen and acetaminophen to help with inflammation, if pain persists follow up in ED tomorrow. Recommendation relayed to patient at this time.

## 2019-02-09 NOTE — ED Triage Notes (Signed)
Patient c/o "pain all over" x2 days. Hx sickle cell. Also c/o constant left side chest pain x1 days. Denies SOB, cough, and fever.

## 2019-02-09 NOTE — ED Notes (Signed)
Patient requesting blood draw from port. 

## 2019-02-10 ENCOUNTER — Encounter (HOSPITAL_COMMUNITY): Payer: Self-pay

## 2019-02-10 ENCOUNTER — Other Ambulatory Visit: Payer: Self-pay

## 2019-02-10 ENCOUNTER — Emergency Department (HOSPITAL_COMMUNITY): Payer: Medicaid - Out of State

## 2019-02-10 DIAGNOSIS — Z832 Family history of diseases of the blood and blood-forming organs and certain disorders involving the immune mechanism: Secondary | ICD-10-CM | POA: Diagnosis not present

## 2019-02-10 DIAGNOSIS — M545 Low back pain: Secondary | ICD-10-CM | POA: Diagnosis present

## 2019-02-10 DIAGNOSIS — I2699 Other pulmonary embolism without acute cor pulmonale: Secondary | ICD-10-CM | POA: Diagnosis present

## 2019-02-10 DIAGNOSIS — D5701 Hb-SS disease with acute chest syndrome: Secondary | ICD-10-CM | POA: Diagnosis present

## 2019-02-10 DIAGNOSIS — I2693 Single subsegmental pulmonary embolism without acute cor pulmonale: Secondary | ICD-10-CM | POA: Diagnosis present

## 2019-02-10 DIAGNOSIS — F419 Anxiety disorder, unspecified: Secondary | ICD-10-CM | POA: Diagnosis present

## 2019-02-10 DIAGNOSIS — D5700 Hb-SS disease with crisis, unspecified: Secondary | ICD-10-CM

## 2019-02-10 DIAGNOSIS — D539 Nutritional anemia, unspecified: Secondary | ICD-10-CM | POA: Diagnosis present

## 2019-02-10 DIAGNOSIS — E559 Vitamin D deficiency, unspecified: Secondary | ICD-10-CM | POA: Diagnosis present

## 2019-02-10 DIAGNOSIS — F411 Generalized anxiety disorder: Secondary | ICD-10-CM | POA: Diagnosis present

## 2019-02-10 DIAGNOSIS — K5900 Constipation, unspecified: Secondary | ICD-10-CM | POA: Diagnosis present

## 2019-02-10 DIAGNOSIS — Z79899 Other long term (current) drug therapy: Secondary | ICD-10-CM | POA: Diagnosis not present

## 2019-02-10 DIAGNOSIS — D638 Anemia in other chronic diseases classified elsewhere: Secondary | ICD-10-CM | POA: Diagnosis present

## 2019-02-10 DIAGNOSIS — G894 Chronic pain syndrome: Secondary | ICD-10-CM | POA: Diagnosis present

## 2019-02-10 DIAGNOSIS — Z20828 Contact with and (suspected) exposure to other viral communicable diseases: Secondary | ICD-10-CM | POA: Diagnosis present

## 2019-02-10 DIAGNOSIS — Z21 Asymptomatic human immunodeficiency virus [HIV] infection status: Secondary | ICD-10-CM | POA: Diagnosis present

## 2019-02-10 LAB — CBC WITH DIFFERENTIAL/PLATELET
Abs Immature Granulocytes: 0.05 10*3/uL (ref 0.00–0.07)
Basophils Absolute: 0.1 10*3/uL (ref 0.0–0.1)
Basophils Relative: 1 %
Eosinophils Absolute: 0.2 10*3/uL (ref 0.0–0.5)
Eosinophils Relative: 2 %
HCT: 23.8 % — ABNORMAL LOW (ref 39.0–52.0)
Hemoglobin: 8.6 g/dL — ABNORMAL LOW (ref 13.0–17.0)
Immature Granulocytes: 0 %
Lymphocytes Relative: 46 %
Lymphs Abs: 6.4 10*3/uL — ABNORMAL HIGH (ref 0.7–4.0)
MCH: 38.9 pg — ABNORMAL HIGH (ref 26.0–34.0)
MCHC: 36.1 g/dL — ABNORMAL HIGH (ref 30.0–36.0)
MCV: 107.7 fL — ABNORMAL HIGH (ref 80.0–100.0)
Monocytes Absolute: 1.7 10*3/uL — ABNORMAL HIGH (ref 0.1–1.0)
Monocytes Relative: 12 %
Neutro Abs: 5.5 10*3/uL (ref 1.7–7.7)
Neutrophils Relative %: 39 %
Platelets: 413 10*3/uL — ABNORMAL HIGH (ref 150–400)
RBC: 2.21 MIL/uL — ABNORMAL LOW (ref 4.22–5.81)
RDW: 21.5 % — ABNORMAL HIGH (ref 11.5–15.5)
WBC: 14 10*3/uL — ABNORMAL HIGH (ref 4.0–10.5)
nRBC: 2.9 % — ABNORMAL HIGH (ref 0.0–0.2)

## 2019-02-10 LAB — D-DIMER, QUANTITATIVE: D-Dimer, Quant: 2.47 ug/mL-FEU — ABNORMAL HIGH (ref 0.00–0.50)

## 2019-02-10 LAB — HEPARIN LEVEL (UNFRACTIONATED): Heparin Unfractionated: 0.25 IU/mL — ABNORMAL LOW (ref 0.30–0.70)

## 2019-02-10 LAB — TROPONIN I (HIGH SENSITIVITY): Troponin I (High Sensitivity): 2 ng/L (ref <18)

## 2019-02-10 LAB — RETICULOCYTES
Immature Retic Fract: 36.1 % — ABNORMAL HIGH (ref 2.3–15.9)
RBC.: 2.21 MIL/uL — ABNORMAL LOW (ref 4.22–5.81)
Retic Count, Absolute: 393.2 10*3/uL — ABNORMAL HIGH (ref 19.0–186.0)
Retic Ct Pct: 17.8 % — ABNORMAL HIGH (ref 0.4–3.1)

## 2019-02-10 LAB — APTT: aPTT: 31 seconds (ref 24–36)

## 2019-02-10 LAB — PROTIME-INR
INR: 1.2 (ref 0.8–1.2)
Prothrombin Time: 15 seconds (ref 11.4–15.2)

## 2019-02-10 LAB — SARS CORONAVIRUS 2 BY RT PCR (HOSPITAL ORDER, PERFORMED IN ~~LOC~~ HOSPITAL LAB): SARS Coronavirus 2: NEGATIVE

## 2019-02-10 MED ORDER — POLYETHYLENE GLYCOL 3350 17 G PO PACK: 17.0000 g | PACK | Freq: Every day | ORAL | Status: DC | PRN

## 2019-02-10 MED ORDER — ABACAVIR-DOLUTEGRAVIR-LAMIVUD 600-50-300 MG PO TABS
1.0000 | ORAL_TABLET | Freq: Every day | ORAL | Status: DC
  Administered 2019-02-10 – 2019-02-14 (×5): 1 via ORAL
  Filled 2019-02-10 (×5): qty 1

## 2019-02-10 MED ORDER — SODIUM CHLORIDE (PF) 0.9 % IJ SOLN
INTRAMUSCULAR | Status: AC
  Filled 2019-02-10: qty 50

## 2019-02-10 MED ORDER — CHLORHEXIDINE GLUCONATE CLOTH 2 % EX PADS
6.0000 | MEDICATED_PAD | Freq: Every day | CUTANEOUS | Status: DC
  Administered 2019-02-10 – 2019-02-12 (×3): 6 via TOPICAL

## 2019-02-10 MED ORDER — SODIUM CHLORIDE 0.9 % IV SOLN
1.0000 g | INTRAVENOUS | Status: DC
  Administered 2019-02-11 – 2019-02-13 (×3): 1 g via INTRAVENOUS
  Filled 2019-02-10 (×3): qty 1
  Filled 2019-02-10: qty 10

## 2019-02-10 MED ORDER — SODIUM CHLORIDE 0.9% FLUSH
10.0000 mL | Freq: Two times a day (BID) | INTRAVENOUS | Status: DC
  Administered 2019-02-14: 10 mL

## 2019-02-10 MED ORDER — SODIUM CHLORIDE 0.9 % IV SOLN
25.0000 mg | INTRAVENOUS | Status: DC | PRN
  Administered 2019-02-10 – 2019-02-14 (×14): 25 mg via INTRAVENOUS
  Filled 2019-02-10 (×2): qty 25
  Filled 2019-02-10: qty 0.5
  Filled 2019-02-10: qty 25
  Filled 2019-02-10: qty 0.5
  Filled 2019-02-10: qty 25
  Filled 2019-02-10 (×2): qty 0.5
  Filled 2019-02-10: qty 25
  Filled 2019-02-10: qty 0.5
  Filled 2019-02-10: qty 25
  Filled 2019-02-10: qty 0.5
  Filled 2019-02-10 (×5): qty 25

## 2019-02-10 MED ORDER — HYDROXYUREA 500 MG PO CAPS
2000.0000 mg | ORAL_CAPSULE | Freq: Every day | ORAL | Status: DC
  Administered 2019-02-10 – 2019-02-14 (×5): 2000 mg via ORAL
  Filled 2019-02-10 (×5): qty 4

## 2019-02-10 MED ORDER — NALOXONE HCL 0.4 MG/ML IJ SOLN: 0.4000 mg | INTRAMUSCULAR | Status: DC | PRN

## 2019-02-10 MED ORDER — SODIUM CHLORIDE 0.9% FLUSH: 9.0000 mL | INTRAVENOUS | Status: DC | PRN

## 2019-02-10 MED ORDER — DIPHENHYDRAMINE HCL 25 MG PO CAPS: 25.0000 mg | ORAL_CAPSULE | ORAL | Status: DC | PRN

## 2019-02-10 MED ORDER — HYDROMORPHONE HCL 1 MG/ML IJ SOLN
2.0000 mg | Freq: Once | INTRAMUSCULAR | Status: AC
  Administered 2019-02-10: 2 mg via INTRAVENOUS
  Filled 2019-02-10: qty 2

## 2019-02-10 MED ORDER — SODIUM CHLORIDE 0.9 % IV SOLN
1.0000 g | Freq: Once | INTRAVENOUS | Status: AC
  Administered 2019-02-10: 1 g via INTRAVENOUS
  Filled 2019-02-10: qty 10

## 2019-02-10 MED ORDER — SODIUM CHLORIDE 0.9 % IV SOLN
500.0000 mg | INTRAVENOUS | Status: DC
  Administered 2019-02-11: 500 mg via INTRAVENOUS
  Filled 2019-02-10: qty 500

## 2019-02-10 MED ORDER — APIXABAN 5 MG PO TABS
10.0000 mg | ORAL_TABLET | Freq: Two times a day (BID) | ORAL | Status: DC
  Administered 2019-02-10 – 2019-02-14 (×10): 10 mg via ORAL
  Filled 2019-02-10 (×10): qty 2

## 2019-02-10 MED ORDER — IOHEXOL 350 MG/ML SOLN
100.0000 mL | Freq: Once | INTRAVENOUS | Status: AC | PRN
  Administered 2019-02-10: 100 mL via INTRAVENOUS

## 2019-02-10 MED ORDER — SENNOSIDES-DOCUSATE SODIUM 8.6-50 MG PO TABS
1.0000 | ORAL_TABLET | Freq: Two times a day (BID) | ORAL | Status: DC
  Administered 2019-02-10 – 2019-02-14 (×9): 1 via ORAL
  Filled 2019-02-10 (×10): qty 1

## 2019-02-10 MED ORDER — SODIUM CHLORIDE 0.9% FLUSH: 10.0000 mL | INTRAVENOUS | Status: DC | PRN

## 2019-02-10 MED ORDER — ALPRAZOLAM 1 MG PO TABS
1.0000 mg | ORAL_TABLET | Freq: Two times a day (BID) | ORAL | Status: DC | PRN
  Administered 2019-02-12 – 2019-02-13 (×2): 1 mg via ORAL
  Filled 2019-02-10 (×4): qty 1

## 2019-02-10 MED ORDER — ONDANSETRON HCL 4 MG/2ML IJ SOLN
4.0000 mg | Freq: Four times a day (QID) | INTRAMUSCULAR | Status: DC | PRN
  Administered 2019-02-10: 4 mg via INTRAVENOUS
  Filled 2019-02-10: qty 2

## 2019-02-10 MED ORDER — APIXABAN 5 MG PO TABS: 5.0000 mg | ORAL_TABLET | Freq: Two times a day (BID) | ORAL | Status: DC

## 2019-02-10 MED ORDER — OXYCODONE HCL 5 MG PO TABS
10.0000 mg | ORAL_TABLET | Freq: Four times a day (QID) | ORAL | Status: DC | PRN
  Administered 2019-02-11 – 2019-02-13 (×7): 10 mg via ORAL
  Filled 2019-02-10 (×7): qty 2

## 2019-02-10 MED ORDER — SODIUM CHLORIDE 0.45 % IV SOLN
INTRAVENOUS | Status: AC
  Administered 2019-02-10: 10:00:00 via INTRAVENOUS

## 2019-02-10 MED ORDER — HEPARIN (PORCINE) 25000 UT/250ML-% IV SOLN
1600.0000 [IU]/h | INTRAVENOUS | Status: DC
  Administered 2019-02-10: 1600 [IU]/h via INTRAVENOUS
  Filled 2019-02-10: qty 250

## 2019-02-10 MED ORDER — HYDROMORPHONE 1 MG/ML IV SOLN
INTRAVENOUS | Status: DC
  Administered 2019-02-10: 10.2 mg via INTRAVENOUS
  Administered 2019-02-10: 7.2 mg via INTRAVENOUS
  Administered 2019-02-10: 30 mg via INTRAVENOUS
  Administered 2019-02-10: 4.2 mg via INTRAVENOUS
  Administered 2019-02-11: 8.4 mg via INTRAVENOUS
  Administered 2019-02-11: 10.8 mg via INTRAVENOUS
  Administered 2019-02-11: 30 mg via INTRAVENOUS
  Administered 2019-02-11: 10.2 mg via INTRAVENOUS
  Administered 2019-02-11: 9 mg via INTRAVENOUS
  Administered 2019-02-11: 7.2 mg via INTRAVENOUS
  Administered 2019-02-12: 30 mg via INTRAVENOUS
  Administered 2019-02-12: 10.2 mg via INTRAVENOUS
  Administered 2019-02-12: 4.8 mg via INTRAVENOUS
  Administered 2019-02-12: 30 mg via INTRAVENOUS
  Administered 2019-02-12: 4.2 mg via INTRAVENOUS
  Administered 2019-02-12: 10.2 mg via INTRAVENOUS
  Administered 2019-02-12: 8.4 mg via INTRAVENOUS
  Administered 2019-02-12: 7.8 mg via INTRAVENOUS
  Administered 2019-02-12: 7.2 mg via INTRAVENOUS
  Administered 2019-02-13: 9.79 mg via INTRAVENOUS
  Administered 2019-02-13: 10.8 mg via INTRAVENOUS
  Administered 2019-02-13: 30 mg via INTRAVENOUS
  Administered 2019-02-13: 0.8 mg via INTRAVENOUS
  Administered 2019-02-14: 6.5 mg via INTRAVENOUS
  Filled 2019-02-10 (×6): qty 30

## 2019-02-10 MED ORDER — SODIUM CHLORIDE 0.9 % IV SOLN
500.0000 mg | Freq: Once | INTRAVENOUS | Status: AC
  Administered 2019-02-10: 500 mg via INTRAVENOUS
  Filled 2019-02-10: qty 500

## 2019-02-10 MED ORDER — HEPARIN BOLUS VIA INFUSION
4000.0000 [IU] | Freq: Once | INTRAVENOUS | Status: AC
  Administered 2019-02-10: 4000 [IU] via INTRAVENOUS
  Filled 2019-02-10: qty 4000

## 2019-02-10 NOTE — Progress Notes (Signed)
[place] for Heparin Indication: pulmonary embolus  Allergies  Allergen Reactions  . Ketorolac Swelling    Patient reports facial edema and left arm edema after administration.    Patient Measurements: Height: 6\' 3"  (190.5 cm) Weight: 149 lb 14.6 oz (68 kg) IBW/kg (Calculated) : 84.5 Heparin Dosing Weight: 68 kg  Vital Signs: Temp: 98.2 F (36.8 C) (10/23 1047) Temp Source: Oral (10/23 1047) BP: 104/60 (10/23 1053) Pulse Rate: 86 (10/23 1047)  Labs: Recent Labs    02/09/19 2032 02/09/19 2234 02/10/19 0340 02/10/19 1158  HGB 8.6*  --   --   --   HCT 23.8*  --   --   --   PLT 413*  --   --   --   APTT  --   --  31  --   LABPROT  --   --  15.0  --   INR  --   --  1.2  --   HEPARINUNFRC  --   --   --  0.25*  CREATININE 0.92  --   --   --   TROPONINIHS <2 2  --   --     Estimated Creatinine Clearance: 113.9 mL/min (by C-G formula based on SCr of 0.92 mg/dL).   Medical History: Past Medical History:  Diagnosis Date  . Anxiety   . HIV (human immunodeficiency virus infection) ([place])   . Sickle cell crisis ([place])   . 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[place])   . Vitamin D deficiency 10/2018    Medications:  Infusions:  . sodium chloride 125 mL/hr at 02/10/19 1010  . diphenhydrAMINE 25 mg (02/10/19 1057)    Assessment: Pt is a 35 YOM presenting with c/o chest pain and lower back pain. Has a hx of sickle cell, HIV, and chronically low O2. CT was positive for PE and nodular opacities in the left upper lung concerning for infection. Pharmacy dosed heparin and is now transitioning patient to a DOAC.  Goal of Therapy:  Therapeutic anticoagulation   Plan:  D/c heparin drip Eliquis 10mg  PO BID x 7 days, then Eliquis 5mg  PO BID for treatment of PE Provide Eliquis education and discount card No DDI w/ Triumeq Monitor for s/sx of bleed  Natale Lay, PharmD Candidate 02/10/2019,12:53 PM

## 2019-02-10 NOTE — H&P (Signed)
History and Physical    Martin [name] MVH:[id number] DOB: 01/02/1991 DOA: 02/09/2019  PCP: Kallie LocksStroud, Natalie M, [name]   Patient coming from: Home   Chief Complaint: Low back pain, left chest pain   HPI: Martin [name] is a 28 y.o. male with medical history significant for sickle cell anemia, HIV, anxiety, now presenting to the emergency department with severe pain in his low back and left upper chest.  Low back pain started to become severe yesterday, was initially improving with oxycodone and ibuprofen, but progressed throughout the course of the day.  This back pain he reports to be consistent with his typical pain crisis, but he also notes pleuritic pain in the upper left chest.  The pleuritic chest pain has been bothering him for approximately 1 day but he reports a similar pain in that same spot approximately 1 month ago though that was brief and much less severe.  Patient has a mild nonproductive cough but has not felt particularly short of breath.  He denies fevers or chills.  He denies lower extremity swelling or tenderness.  ED Course: Upon arrival to the ED, patient is found to be afebrile, saturating 94% on room air, mildly tachypneic, and with stable blood pressure.  Chest x-ray is negative for acute findings.  Chemistry panel notable for bilirubin 5.8.  CBC features a leukocytosis to 14,000, mild thrombocytosis, and stable macrocytic anemia.  High-sensitivity troponin is normal x2.  D-dimer is elevated to 2.47.  CTA chest is concerning for partially occlusive posterior right lower lobe segmental and subsegmental pulmonary embolism as well as peripheral nodular opacity in the left upper lobe that could reflect acute chest syndrome/infection.  Blood cultures were collected.  Patient was treated with multiple doses of IV Dilaudid in the ED, Zofran, Rocephin, azithromycin, and was started on IV heparin infusion.  Review of Systems:  All other systems reviewed and apart from HPI, are  negative.  Past Medical History:  Diagnosis Date   Anxiety    HIV (human immunodeficiency virus infection) (HCC)    Sickle cell crisis (HCC)    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HCC)    Vitamin D deficiency 10/2018    History reviewed. No pertinent surgical history.   reports that he has never smoked. He has never used smokeless tobacco. He reports that he does not drink alcohol or use drugs.  Allergies  Allergen Reactions   Ketorolac Swelling    Patient reports facial edema and left arm edema after administration.    Family History  Problem Relation Age of Onset   Sickle cell trait Mother    Sickle cell trait Father    Birth defects Maternal Grandmother    Birth defects Paternal Grandmother      Prior to Admission medications   Medication Sig Start Date End Date Taking? Authorizing Provider  abacavir-dolutegravir-lamiVUDine (TRIUMEQ) 600-50-300 MG tablet Take 1 tablet by mouth daily.   Yes [provider]  ALPRAZolam Prudy Feeler(XANAX) 1 MG tablet Take 1 tablet (1 mg total) by mouth 2 (two) times daily as needed for anxiety. 12/16/18  Yes Kallie LocksStroud, Natalie M, [name]  cetirizine (ZYRTEC) 10 MG chewable tablet Chew 10 mg by mouth daily as needed for allergies.    Yes [provider]  diphenhydrAMINE (BENADRYL) 25 MG tablet Take 25 mg by mouth every 6 (six) hours as needed for allergies.   Yes [provider]  hydroxyurea (HYDREA) 500 MG capsule Take 4 capsules (2,000 mg total) by mouth at bedtime. 11/15/18  Yes  [name], [name]  Multiple Vitamin (MULTIVITAMIN WITH MINERALS) TABS tablet Take 1 tablet by mouth daily.   Yes [provider]  oxyCODONE-acetaminophen (PERCOCET) 10-325 MG tablet Take 1 tablet by mouth every 4 (four) hours as needed for up to 15 days for pain. 02/01/19 02/16/19 Yes [name], [name]  naloxone [facility]) 0.4 MG/ML injection As needed. Patient taking differently: Inject 0.4 mg into the muscle daily as needed (overdose).  11/28/18    [name], [name]  Vitamin D, Ergocalciferol, (DRISDOL) 1.25 MG (50000 UT) CAPS capsule Take 1 capsule (50,000 Units total) by mouth every 7 (seven) days. Patient not taking: Reported on 02/09/2019 11/01/18   [name], [name]    Physical Exam: Vitals:   02/10/19 0245 02/10/19 0300 02/10/19 0330 02/10/19 0411  BP:   111/78 104/72  Pulse: 76 71 86 89  Resp: Temp:      TempSrc:      SpO2: 95% 96% 95% 94%  Weight:      Height:        Constitutional: NAD, appears uncomfortable  Eyes: PERTLA, lids and conjunctivae normal ENMT: Mucous membranes are moist. Posterior pharynx clear of any exudate or lesions.   Neck: normal, supple, no masses, no thyromegaly Respiratory: no wheezing, no crackles. Normal respiratory effort. No accessory muscle use.  Cardiovascular: S1 & S2 heard, regular rate and rhythm. No extremity edema.   Abdomen: No distension, no tenderness, soft. Bowel sounds normal.  Musculoskeletal: no clubbing / cyanosis. No joint deformity upper and lower extremities.  Skin: no significant rashes, lesions, ulcers. Warm, dry, well-perfused. Neurologic: No facial asymmetry. Sensation intact. Moving all extremities.  Psychiatric: Alert and oriented to person, place, and situation. Pleasant, cooperative.    Labs on Admission: I have personally reviewed following labs and imaging studies  CBC: Recent Labs  Lab 02/09/19 2032  WBC 14.0*  NEUTROABS 5.5  HGB 8.6*  HCT 23.8*  MCV 107.7*  PLT 413*   Basic Metabolic Panel: Recent Labs  Lab 02/09/19 2032  NA 138  K 3.8  CL 106  CO2 24  GLUCOSE 99  BUN 9  CREATININE 0.92  CALCIUM 8.9   GFR: Estimated Creatinine Clearance: 113.9 mL/min (by C-G formula based on SCr of 0.92 mg/dL). Liver Function Tests: Recent Labs  Lab 02/09/19 2032  AST 21  ALT 15  ALKPHOS 58  BILITOT 5.8*  PROT 8.5*  ALBUMIN 3.9   No results for input(s): LIPASE, AMYLASE in the last 168 hours. No results for input(s):  AMMONIA in the last 168 hours. Coagulation Profile: No results for input(s): INR, PROTIME in the last 168 hours. Cardiac Enzymes: No results for input(s): CKTOTAL, CKMB, CKMBINDEX, TROPONINI in the last 168 hours. BNP (last 3 results) No results for input(s): PROBNP in the last 8760 hours. HbA1C: No results for input(s): HGBA1C in the last 72 hours. CBG: No results for input(s): GLUCAP in the last 168 hours. Lipid Profile: No results for input(s): CHOL, HDL, LDLCALC, [name], CHOLHDL, LDLDIRECT in the last 72 hours. Thyroid Function Tests: No results for input(s): TSH, T4TOTAL, FREET4, T3FREE, THYROIDAB in the last 72 hours. Anemia Panel: Recent Labs    02/09/19 2032  RETICCTPCT 17.8*   Urine analysis:    Component Value Date/Time   COLORURINE YELLOW 02/02/2017 1024   APPEARANCEUR CLEAR 02/02/2017 1024   LABSPEC 1.012 02/02/2017 1024   PHURINE 5.0 02/02/2017 1024   GLUCOSEU NEGATIVE 02/02/2017 1024   HGBUR NEGATIVE 02/02/2017  1024   BILIRUBINUR small (A) 10/17/2018 1339   KETONESUR negative 10/17/2018 1339   KETONESUR NEGATIVE 02/02/2017 1024   PROTEINUR =30 (A) 10/17/2018 1339   PROTEINUR NEGATIVE 02/02/2017 1024   UROBILINOGEN 4.0 (A) 10/17/2018 1339   UROBILINOGEN 4.0 (H) 05/21/2009 1944   NITRITE Negative 10/17/2018 1339   NITRITE NEGATIVE 02/02/2017 1024   LEUKOCYTESUR Negative 10/17/2018 1339   Sepsis Labs: @LABRCNTIP (procalcitonin:4,lacticidven:4) )No results found for this or any previous visit (from the past 240 hour(s)).   Radiological Exams on Admission: Dg Chest 2 View  Result Date: 02/09/2019 CLINICAL DATA:  Sickle cell crisis. Mid chest pain. EXAM: CHEST - 2 VIEW COMPARISON:  01/17/2019 FINDINGS: Right chest port remains in place with tip in the SVC. Chronic interstitial opacities and scarring, not significantly changed from prior exam. No acute airspace disease. Upper normal heart size with normal mediastinal contours. No pleural effusion or pneumothorax.  No acute osseous abnormalities. IMPRESSION: Stable appearance of the chest with chronic interstitial opacities and scarring. No superimposed acute abnormality. Electronically Signed   By: Keith Rake M.D.   On: 02/09/2019 21:39   Ct Angio Chest Pe W And/or Wo Contrast  Result Date: 02/10/2019 CLINICAL DATA:  Chest pain, history of sickle cell EXAM: CT ANGIOGRAPHY CHEST WITH CONTRAST TECHNIQUE: Multidetector CT imaging of the chest was performed using the standard protocol during bolus administration of intravenous contrast. Multiplanar CT image reconstructions and MIPs were obtained to evaluate the vascular anatomy. CONTRAST:  122mL OMNIPAQUE IOHEXOL 350 MG/ML SOLN COMPARISON:  Radiograph same day FINDINGS: Cardiovascular: There is slightly suboptimal opacification of the main pulmonary artery. There is partially occlusive thrombus within the posterior right lower lobe segmental and subsegmental arterial branches. There is limited visualization of the subsegmental branches however. There is mild cardiomegaly. No evidence of right ventricular heart strain. No pericardial effusion. There is normal three-vessel brachiocephalic anatomy without proximal stenosis. The thoracic aorta is normal in appearance. Mediastinum/Nodes: No hilar, mediastinal, or axillary adenopathy. Thyroid gland, trachea, and esophagus demonstrate no significant findings. Lungs/Pleura: There is patchy round nodular opacity seen in the posterior left upper lung and small ground-glass opacities within the anterior left lower lobe. There are increased interstitial opacities with bronchiectasis and ground-glass seen throughout both lung bases. No pleural effusion. Upper Abdomen: Coarse calcifications within the region of probable atrophy spleen. Musculoskeletal: No chest wall abnormality. No acute or significant osseous findings. Review of the MIP images confirms the above findings. IMPRESSION: 1. Slightly suboptimal opacification of the main  pulmonary artery. Partially occlusive posterior right lower lobe segmental and subsegmental pulmonary embolism. 2. Peripheral nodular opacities seen within the left upper lung. The findings could represent acute chest syndrome/infectious etiology. 3. Chronic interstitial lung changes/scarring at both lung bases. 4. These results were called by telephone at the time of interpretation on 02/10/2019 at 2:57 am to provider Montine Circle , who verbally acknowledged these results. Electronically Signed   By: Prudencio Pair M.D.   On: 02/10/2019 03:03    EKG: Ordered, not yet performed.   Assessment/Plan   1. Pulmonary embolism  - Presents with sickle cell pain crisis but also notes pleuritic chest pain that is unusual for him and was found to have segmental and subsegmental PE   - BP has been stable, there is no respiratory distress, troponin normal x2, no LE pain or swelling   - He was started on IV heparin in ED, will continue    2. Sickle cell anemia with pain crisis; acute chest syndrome  -  Presents with severe pain in low back similar to his prior pain crises as well as new pleuritic pain, reports some improvement after multiple doses IV Dilaudid in ED but continues to complain of severe pain  - CTA concerning for possible acute chest syndrome and he was started on Rocephin and azithromycin in ED  - Start wt-based Dilaudid PCA, continue IVF hydration, check sputum culture, start incentive spirometry, continue antibiotics   3. HIV  - CD4 was 552 and VL 9040 copies/mL in September 2020  - Continue Triumeq     PPE: Mask, face shield  DVT prophylaxis: IV heparin  Code Status: Full  Family Communication: Discussed with patient  Consults called: None Admission status: inpatient     Briscoe Deutscher, MD [facility] Pager [phone]  If 7PM-7AM, please contact night-coverage [email or web address] Password [email or web address]  02/10/2019, 4:26 AM

## 2019-02-10 NOTE — ED Notes (Signed)
Attempted to call and give report to the floor nurse, stated she would have to call back.

## 2019-02-10 NOTE — Progress Notes (Signed)
Subjective: Martin [name], a 28 year old male with a medical history significant for sickle cell disease, HIV on antivirals, generalized anxiety, and anemia of chronic disease was admitted overnight in sickle cell pain crisis in the setting of pulmonary embolism.  CTA of chest concerning for partially occlusive posterior right lower lobe segmental and subsegmental pulmonary embolism as well as peripheral nodule opacity in the left upper lobe echo reflects acute syndrome/infection.  Plan: Agree with current plan. Continue IV Dilaudid via PCA per weight-based protocol.  No changes in settings at this time. Decrease IV fluids to 0.45% saline at 75 mL/h Patient currently on heparin drip 16 mL/h per pharmacy dosing.  Transition to [place], Eliquis per pharmacy consult.   Donia Pounds  APRN, [name], [name] Patient [facility] [address] [place], [place] [address] [phone]

## 2019-02-10 NOTE — Plan of Care (Signed)
POC initiated 

## 2019-02-10 NOTE — Discharge Instructions (Addendum)
Information on my medicine - ELIQUIS (apixaban)  This medication education was reviewed with me or my healthcare representative as part of my discharge preparation.  Why was Eliquis prescribed for you? Eliquis was prescribed to treat blood clots that may have been found in the veins of your legs (deep vein thrombosis) or in your lungs (pulmonary embolism) and to reduce the risk of them occurring again.  What do You need to know about Eliquis ? The starting dose is 10 mg (two 5 mg tablets) taken TWICE daily for the FIRST SEVEN (7) DAYS, then on Friday 10/30  the dose is reduced to ONE 5 mg tablet taken TWICE daily.  Eliquis may be taken with or without food.   Try to take the dose about the same time in the morning and in the evening. If you have difficulty swallowing the tablet whole please discuss with your pharmacist how to take the medication safely.  Take Eliquis exactly as prescribed and DO NOT stop taking Eliquis without talking to the doctor who prescribed the medication.  Stopping may increase your risk of developing a new blood clot.  Refill your prescription before you run out.  After discharge, you should have regular check-up appointments with your healthcare provider that is prescribing your Eliquis.    What do you do if you miss a dose? If a dose of ELIQUIS is not taken at the scheduled time, take it as soon as possible on the same day and twice-daily administration should be resumed. The dose should not be doubled to make up for a missed dose.  Important Safety Information A possible side effect of Eliquis is bleeding. You should call your healthcare provider right away if you experience any of the following: ? Bleeding from an injury or your nose that does not stop. ? Unusual colored urine (red or dark brown) or unusual colored stools (red or black). ? Unusual bruising for unknown reasons. ? A serious fall or if you hit your head (even if there is no  bleeding).  Some medicines may interact with Eliquis and might increase your risk of bleeding or clotting while on Eliquis. To help avoid this, consult your healthcare provider or pharmacist prior to using any new prescription or non-prescription medications, including herbals, vitamins, non-steroidal anti-inflammatory drugs (NSAIDs) and supplements.  This website has more information on Eliquis (apixaban): http://www.eliquis.com/eliquis/home

## 2019-02-10 NOTE — Progress Notes (Signed)
This RN took and received report form [place] RN upon initial call to this nurse.

## 2019-02-10 NOTE — Progress Notes (Signed)
ANTICOAGULATION CONSULT NOTE - Initial Consult  Pharmacy Consult for Heparin Indication: pulmonary embolus  Allergies  Allergen Reactions  . Ketorolac Swelling    Patient reports facial edema and left arm edema after administration.    Patient Measurements: Height: 6\' 3"  (190.5 cm) Weight: 150 lb (68 kg) IBW/kg (Calculated) : 84.5 Heparin Dosing Weight:   Vital Signs: Temp: 99.2 F (37.3 C) (10/22 2030) Temp Source: Oral (10/22 2030) BP: 99/58 (10/23 0300) Pulse Rate: 71 (10/23 0300)  Labs: Recent Labs    02/09/19 2032 02/09/19 2234  HGB 8.6*  --   HCT 23.8*  --   PLT 413*  --   CREATININE 0.92  --   TROPONINIHS <2 2    Estimated Creatinine Clearance: 113.9 mL/min (by C-G formula based on SCr of 0.92 mg/dL).   Medical History: Past Medical History:  Diagnosis Date  . Anxiety   . HIV (human immunodeficiency virus infection) ([place])   . Sickle cell crisis ([place])   . 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[place])   . Vitamin D deficiency 10/2018    Medications:  Infusions:  . sodium chloride 125 mL/hr at 02/10/19 0010  . azithromycin    . cefTRIAXone (ROCEPHIN)  IV    . heparin      Assessment: Patient with PE.  No oral anticoagulants noted on med rec. Baseline coags ordered.    Goal of Therapy:  Heparin level 0.3-0.7 units/ml Monitor platelets by anticoagulation protocol: Yes   Plan:  Heparin bolus 4000 units iv x1 Heparin drip at 1600 units/hr Daily CBC Next heparin level at [place], Bethany Crowford 02/10/2019,3:26 AM

## 2019-02-11 MED ORDER — AZITHROMYCIN 250 MG PO TABS
500.0000 mg | ORAL_TABLET | Freq: Every day | ORAL | Status: DC
  Administered 2019-02-12 – 2019-02-13 (×2): 500 mg via ORAL
  Filled 2019-02-11 (×2): qty 2

## 2019-02-11 MED ORDER — FLEET ENEMA 7-19 GM/118ML RE ENEM
1.0000 | ENEMA | Freq: Once | RECTAL | Status: AC
  Administered 2019-02-11: 1 via RECTAL
  Filled 2019-02-11: qty 1

## 2019-02-11 NOTE — Progress Notes (Signed)
Subjective: Patient is a 28 year old admitted with sickle cell painful crisis as well as pulmonary embolism.  He has history of HIV disease which is under control.  Has significant leukocytosis and thrombocytosis.  Patient's main complaint today is constipation.  He has not been able to have a good bowel movement since admission.  He has tried laxatives without any response.  Patient is asking for possible [place] enema.  Objective: Vital signs in last 24 hours: Temp:  [97.9 F (36.6 C)-98.3 F (36.8 C)] 97.9 F (36.6 C) (10/24 0618) Pulse Rate:  [71-92] 71 (10/24 0618) Resp:  [9-15] 10 (10/24 0752) BP: (92-106)/(60-69) 98/61 (10/24 0618) SpO2:  [91 %-97 %] 94 % (10/24 0752) Weight change:  Last BM Date: 02/07/19  Intake/Output from previous day: 10/23 0701 - 10/24 0700 In: 901.6 [P.O.:600; I.V.:30; IV Piggyback:271.6] Out: 2275 [Urine:2275] Intake/Output this shift: No intake/output data recorded.  General appearance: alert, cooperative, appears stated age and no distress Head: Normocephalic, without obvious abnormality, atraumatic Neck: no adenopathy, no carotid bruit, no JVD, supple, symmetrical, trachea midline and thyroid not enlarged, symmetric, no tenderness/mass/nodules Back: symmetric, no curvature. ROM normal. No CVA tenderness. Resp: clear to auscultation bilaterally Cardio: regular rate and rhythm, S1, S2 normal, no murmur, click, rub or gallop GI: soft, non-tender; bowel sounds normal; no masses,  no organomegaly Extremities: extremities normal, atraumatic, no cyanosis or edema Pulses: 2+ and symmetric Lymph nodes: Cervical, supraclavicular, and axillary nodes normal. Neurologic: Grossly normal  Lab Results: Recent Labs    02/09/19 2032  WBC 14.0*  HGB 8.6*  HCT 23.8*  PLT 413*   BMET Recent Labs    02/09/19 2032  NA 138  K 3.8  CL 106  CO2 24  GLUCOSE 99  BUN 9  CREATININE 0.92  CALCIUM 8.9    Studies/Results: Dg Chest 2 View  Result Date:  02/09/2019 CLINICAL DATA:  Sickle cell crisis. Mid chest pain. EXAM: CHEST - 2 VIEW COMPARISON:  01/17/2019 FINDINGS: Right chest port remains in place with tip in the SVC. Chronic interstitial opacities and scarring, not significantly changed from prior exam. No acute airspace disease. Upper normal heart size with normal mediastinal contours. No pleural effusion or pneumothorax. No acute osseous abnormalities. IMPRESSION: Stable appearance of the chest with chronic interstitial opacities and scarring. No superimposed acute abnormality. Electronically Signed   By: Keith Rake M.D.   On: 02/09/2019 21:39   Ct Angio Chest Pe W And/or Wo Contrast  Result Date: 02/10/2019 CLINICAL DATA:  Chest pain, history of sickle cell EXAM: CT ANGIOGRAPHY CHEST WITH CONTRAST TECHNIQUE: Multidetector CT imaging of the chest was performed using the standard protocol during bolus administration of intravenous contrast. Multiplanar CT image reconstructions and MIPs were obtained to evaluate the vascular anatomy. CONTRAST:  174mL OMNIPAQUE IOHEXOL 350 MG/ML SOLN COMPARISON:  Radiograph same day FINDINGS: Cardiovascular: There is slightly suboptimal opacification of the main pulmonary artery. There is partially occlusive thrombus within the posterior right lower lobe segmental and subsegmental arterial branches. There is limited visualization of the subsegmental branches however. There is mild cardiomegaly. No evidence of right ventricular heart strain. No pericardial effusion. There is normal three-vessel brachiocephalic anatomy without proximal stenosis. The thoracic aorta is normal in appearance. Mediastinum/Nodes: No hilar, mediastinal, or axillary adenopathy. Thyroid gland, trachea, and esophagus demonstrate no significant findings. Lungs/Pleura: There is patchy round nodular opacity seen in the posterior left upper lung and small ground-glass opacities within the anterior left lower lobe. There are increased interstitial  opacities with bronchiectasis  and ground-glass seen throughout both lung bases. No pleural effusion. Upper Abdomen: Coarse calcifications within the region of probable atrophy spleen. Musculoskeletal: No chest wall abnormality. No acute or significant osseous findings. Review of the MIP images confirms the above findings. IMPRESSION: 1. Slightly suboptimal opacification of the main pulmonary artery. Partially occlusive posterior right lower lobe segmental and subsegmental pulmonary embolism. 2. Peripheral nodular opacities seen within the left upper lung. The findings could represent acute chest syndrome/infectious etiology. 3. Chronic interstitial lung changes/scarring at both lung bases. 4. These results were called by telephone at the time of interpretation on 02/10/2019 at 2:57 am to provider Roxy Horseman , who verbally acknowledged these results. Electronically Signed   By: Jonna Clark M.D.   On: 02/10/2019 03:03    Medications: I have reviewed the patient's current medications.  Assessment/Plan: 28 year old admitted with pulmonary embolism as well as sickle cell crisis  #1 pulmonary embolism: Patient currently on heparin drip.  Will transition to oral Xarelto at discharge.  #2 sickle cell painful crisis: Patient is currently on Dilaudid PCA.  He appears to be responding to treatment.  No significant hypoxemia.  Continues to improve pain wise.  Mainly chest pain at this point  #3 HIV disease: Again CD4 count was 552 with viral load 9040 back in September.  Is currently on Triump   LOS: 1 day   Akanksha Bellmore,LAWAL 02/11/2019, 8:03 AM

## 2019-02-11 NOTE — Progress Notes (Signed)
PHARMACIST - PHYSICIAN COMMUNICATION  CONCERNING: Antibiotic IV to Oral Route Change Policy  RECOMMENDATION: This patient is receiving azithromycin by the intravenous route.  Based on criteria approved by the Pharmacy and Therapeutics Committee, the antibiotic(s) is/are being converted to the equivalent oral dose form(s).   DESCRIPTION: These criteria include:  Patient being treated for a respiratory tract infection, urinary tract infection, cellulitis or clostridium difficile associated diarrhea if on metronidazole  The patient is not neutropenic and does not exhibit a GI malabsorption state  The patient is eating (either orally or via tube) and/or has been taking other orally administered medications for a least 24 hours  The patient is improving clinically and has a Tmax < 100.5  If you have questions about this conversion, please contact the Pharmacy Department  []   [phone] )  [facility] []   [phone] )  [facility] []   [phone] )  [facility] []   [phone] )  [facility] [x]   [phone] )  [place], [place].D [phone] 02/11/2019 2:51 PM

## 2019-02-12 MED ORDER — FLEET ENEMA 7-19 GM/118ML RE ENEM
1.0000 | ENEMA | Freq: Every day | RECTAL | Status: DC | PRN
  Administered 2019-02-13 – 2019-02-14 (×2): 1 via RECTAL
  Filled 2019-02-12 (×3): qty 1

## 2019-02-12 NOTE — Progress Notes (Signed)
This RN wasted 4 cc of dilaudid into stericycle from old pca syringue after replaceing syringe. Witnessed by Artemio Aly, RN in [place] med room.

## 2019-02-12 NOTE — Progress Notes (Signed)
Subjective: Patient is doing better.  Still has constipation with mild response to Fleet enema yesterday.  Denied any fever or chills no nausea vomiting or diarrhea.  Pain is still on the left side of his chest with no shortness of breath or cough.  Objective: Vital signs in last 24 hours: Temp:  [98.1 F (36.7 C)-99 F (37.2 C)] 98.6 F (37 C) (10/25 2024) Pulse Rate:  [74-88] 88 (10/25 2024) Resp:  [10-18] 10 (10/25 2024) BP: (98-119)/(64-80) 119/80 (10/25 2024) SpO2:  [90 %-98 %] 98 % (10/25 2024) Weight change:  Last BM Date: 02/11/19  Intake/Output from previous day: 10/24 0701 - 10/25 0700 In: 240 [P.O.:240] Out: 1400 [Urine:1400] Intake/Output this shift: No intake/output data recorded.  General appearance: alert, cooperative, appears stated age and no distress Head: Normocephalic, without obvious abnormality, atraumatic Neck: no adenopathy, no carotid bruit, no JVD, supple, symmetrical, trachea midline and thyroid not enlarged, symmetric, no tenderness/mass/nodules Back: symmetric, no curvature. ROM normal. No CVA tenderness. Resp: clear to auscultation bilaterally Cardio: regular rate and rhythm, S1, S2 normal, no murmur, click, rub or gallop GI: soft, non-tender; bowel sounds normal; no masses,  no organomegaly Extremities: extremities normal, atraumatic, no cyanosis or edema Pulses: 2+ and symmetric Lymph nodes: Cervical, supraclavicular, and axillary nodes normal. Neurologic: Grossly normal  Lab Results: No results for input(s): WBC, HGB, HCT, PLT in the last 72 hours. BMET No results for input(s): NA, K, CL, CO2, GLUCOSE, BUN, CREATININE, CALCIUM in the last 72 hours.  Studies/Results: No results found.  Medications: I have reviewed the patient's current medications.  Assessment/Plan: 28 year old admitted with pulmonary embolism as well as sickle cell crisis  #1 pulmonary embolism: Patient currently on heparin drip.  We will continue with the heparin as  well as oxygen.  Will transition to oral Xarelto at discharge.  #2 sickle cell painful crisis: Patient is currently on Dilaudid PCA.  He appears to be responding to treatment.  No significant hypoxemia.  Continues to improve pain wise.  Mainly chest pain at this point  #3 HIV disease: Again CD4 count was 552 with viral load 9040 back in September.  Is currently on Triump  #4 constipation: We will initiate and continue repeat of Fleet enema.   LOS: 2 days   Jakeem Grape,LAWAL 02/12/2019, 10:19 PM

## 2019-02-12 NOTE — Plan of Care (Signed)

## 2019-02-13 DIAGNOSIS — Z21 Asymptomatic human immunodeficiency virus [HIV] infection status: Secondary | ICD-10-CM

## 2019-02-13 DIAGNOSIS — I2699 Other pulmonary embolism without acute cor pulmonale: Secondary | ICD-10-CM

## 2019-02-13 DIAGNOSIS — F411 Generalized anxiety disorder: Secondary | ICD-10-CM

## 2019-02-13 MED ORDER — OXYCODONE HCL 5 MG PO TABS
10.0000 mg | ORAL_TABLET | ORAL | Status: DC | PRN
  Administered 2019-02-13 – 2019-02-14 (×4): 10 mg via ORAL
  Filled 2019-02-13 (×4): qty 2

## 2019-02-13 MED ORDER — HYDROMORPHONE 1 MG/ML IV SOLN
INTRAVENOUS | Status: DC
  Administered 2019-02-13: 7.2 mg via INTRAVENOUS
  Administered 2019-02-13: 10.2 mg via INTRAVENOUS
  Administered 2019-02-14: 6.7 mg via INTRAVENOUS
  Administered 2019-02-14: 3 mg via INTRAVENOUS
  Administered 2019-02-14: 30 mg via INTRAVENOUS
  Filled 2019-02-13: qty 30

## 2019-02-13 NOTE — Progress Notes (Addendum)
Subjective: Martin [name], a 28 year old male with a medical history significant for sickle cell disease, HIV on antiretroviral therapy, history of generalized anxiety, and history of anemia of chronic disease was admitted for pulmonary embolism in the setting of sickle cell pain crisis.  Patient states that he continues to have pain to left chest.  However, he says that pain has improved since admission.  Pain intensity is 7/10.  Patient has not been maximizing Dilaudid PCA.  Patient has been transitioned to Eliquis for management of pulmonary embolism.  Objective:  Vital signs in last 24 hours:  Vitals:   02/13/19 0959 02/13/19 1203 02/13/19 1318 02/13/19 1652  BP: (!) 114/92  103/72   Pulse: 98  84   Resp: 16 15 16 14   Temp: 98.9 F (37.2 C)  98.6 F (37 C)   TempSrc: Oral  Oral   SpO2: 98% 94% 96% 94%  Weight:      Height:        Intake/Output from previous day:   Intake/Output Summary (Last 24 hours) at 02/13/2019 1805 Last data filed at 02/13/2019 1800 Gross per 24 hour  Intake 1250 ml  Output 2575 ml  Net -1325 ml    Physical Exam: General: Alert, awake, oriented x3, in no acute distress.  HEENT: [place]/AT PEERL, EOMI Neck: Trachea midline,  no masses, no thyromegal,y no JVD, no carotid bruit OROPHARYNX:  Moist, No exudate/ erythema/lesions.  Heart: Regular rate and rhythm, without murmurs, rubs, gallops, PMI non-displaced, no heaves or thrills on palpation.  Lungs: Clear to auscultation, no wheezing or rhonchi noted. No increased vocal fremitus resonant to percussion  Abdomen: Soft, nontender, nondistended, positive bowel sounds, no masses no hepatosplenomegaly noted..  Neuro: No focal neurological deficits noted cranial nerves II through XII grossly intact. DTRs 2+ bilaterally upper and lower extremities. Strength 5 out of 5 in bilateral upper and lower extremities. Musculoskeletal: No warm swelling or erythema around joints, no spinal tenderness  noted. Psychiatric: Patient alert and oriented x3, good insight and cognition, good recent to remote recall. Lymph node survey: No cervical axillary or inguinal lymphadenopathy noted.  Lab Results:  Basic Metabolic Panel:    Component Value Date/Time   NA 138 02/09/2019 2032   NA 136 10/17/2018 1433   K 3.8 02/09/2019 2032   CL 106 02/09/2019 2032   CO2 24 02/09/2019 2032   BUN 9 02/09/2019 2032   BUN 6 10/17/2018 1433   CREATININE 0.92 02/09/2019 2032   GLUCOSE 99 02/09/2019 2032   CALCIUM 8.9 02/09/2019 2032   CBC:    Component Value Date/Time   WBC 14.0 (H) 02/09/2019 2032   HGB 8.6 (L) 02/09/2019 2032   HGB 9.6 (L) 10/17/2018 1433   HCT 23.8 (L) 02/09/2019 2032   HCT 26.2 (L) 10/17/2018 1433   PLT 413 (H) 02/09/2019 2032   PLT 297 10/17/2018 1433   MCV 107.7 (H) 02/09/2019 2032   MCV 108 (H) 10/17/2018 1433   NEUTROABS 5.5 02/09/2019 2032   NEUTROABS 7.0 10/17/2018 1433   LYMPHSABS 6.4 (H) 02/09/2019 2032   LYMPHSABS 4.6 (H) 10/17/2018 1433   MONOABS 1.7 (H) 02/09/2019 2032   EOSABS 0.2 02/09/2019 2032   EOSABS 0.2 10/17/2018 1433   BASOSABS 0.1 02/09/2019 2032   BASOSABS 0.1 10/17/2018 1433    Recent Results (from the past 240 hour(s))  SARS Coronavirus 2 by RT PCR (hospital order, performed in [facility] hospital lab) Nasopharyngeal Nasopharyngeal Swab     Status: None   Collection Time:  02/10/19  3:20 AM   Specimen: Nasopharyngeal Swab  Result Value Ref Range Status   SARS Coronavirus 2 NEGATIVE NEGATIVE Final    Comment: (NOTE) If result is NEGATIVE SARS-CoV-2 target nucleic acids are NOT DETECTED. The SARS-CoV-2 RNA is generally detectable in upper and lower  respiratory specimens during the acute phase of infection. The lowest  concentration of SARS-CoV-2 viral copies this assay can detect is 250  copies / mL. A negative result does not preclude SARS-CoV-2 infection  and should not be used as the sole basis for treatment or other  patient management  decisions.  A negative result may occur with  improper specimen collection / handling, submission of specimen other  than nasopharyngeal swab, presence of viral mutation(s) within the  areas targeted by this assay, and inadequate number of viral copies  (<250 copies / mL). A negative result must be combined with clinical  observations, patient history, and epidemiological information. If result is POSITIVE SARS-CoV-2 target nucleic acids are DETECTED. The SARS-CoV-2 RNA is generally detectable in upper and lower  respiratory specimens dur ing the acute phase of infection.  Positive  results are indicative of active infection with SARS-CoV-2.  Clinical  correlation with patient history and other diagnostic information is  necessary to determine patient infection status.  Positive results do  not rule out bacterial infection or co-infection with other viruses. If result is PRESUMPTIVE POSTIVE SARS-CoV-2 nucleic acids MAY BE PRESENT.   A presumptive positive result was obtained on the submitted specimen  and confirmed on repeat testing.  While 2019 novel coronavirus  (SARS-CoV-2) nucleic acids may be present in the submitted sample  additional confirmatory testing may be necessary for epidemiological  and / or clinical management purposes  to differentiate between  SARS-CoV-2 and other Sarbecovirus currently known to infect humans.  If clinically indicated additional testing with an alternate test  methodology [phone]) is advised. The SARS-CoV-2 RNA is generally  detectable in upper and lower respiratory sp ecimens during the acute  phase of infection. The expected result is Negative. Fact Sheet for Patients:  BoilerBrush.com.cy Fact Sheet for Healthcare Providers: https://pope.com/ This test is not yet approved or cleared by the [place] FDA and has been authorized for detection and/or diagnosis of SARS-CoV-2 by FDA under an  Emergency Use Authorization (EUA).  This EUA will remain in effect (meaning this test can be used) for the duration of the COVID-19 declaration under Section 564(b)(1) of the Act, 21 U.S.C. section 360bbb-3(b)(1), unless the authorization is terminated or revoked sooner. Performed at [facility], [address], [place], [place] [address]   Blood culture (routine x 2)     Status: None (Preliminary result)   Collection Time: 02/10/19  3:20 AM   Specimen: BLOOD  Result Value Ref Range Status   Specimen Description   Final    BLOOD BLOOD LEFT FOREARM Performed at [facility], [address] [place], [place] [address]    Special Requests   Final    BOTTLES DRAWN AEROBIC AND ANAEROBIC Blood Culture adequate volume Performed at [facility], [address], [place], [place] [address]    Culture   Final    NO GROWTH 3 DAYS Performed at [facility], [address] [place], [place] [address]    Report Status PENDING  Incomplete  Blood culture (routine x 2)     Status: None (Preliminary result)   Collection Time: 02/10/19  3:20 AM  Specimen: BLOOD  Result Value Ref Range Status   Specimen Description   Final    BLOOD PORTA CATH Performed at [facility] [address] [place], [place] [address]    Special Requests   Final    BOTTLES DRAWN AEROBIC AND ANAEROBIC Blood Culture adequate volume Performed at [facility] [address], [place], [place] [address]    Culture   Final    NO GROWTH 3 DAYS Performed at [facility], [place] [address] [place], [place] [address]    Report Status PENDING  Incomplete    Studies/Results: No results found.  Medications: Scheduled Meds: . abacavir-dolutegravir-lamiVUDine  1 tablet Oral Daily  . apixaban  10 mg Oral BID   Followed by  . [START ON 02/17/2019] apixaban  5 mg Oral BID  . Chlorhexidine Gluconate Cloth  6 each Topical  Daily  . HYDROmorphone   Intravenous Q4H  . hydroxyurea  2,000 mg Oral QHS  . senna-docusate  1 tablet Oral BID  . sodium chloride flush  10-40 mL Intracatheter Q12H   Continuous Infusions: . diphenhydrAMINE 25 mg (02/13/19 1623)   PRN Meds:.ALPRAZolam, diphenhydrAMINE **OR** diphenhydrAMINE, naloxone **AND** sodium chloride flush, ondansetron (ZOFRAN) IV, oxyCODONE, polyethylene glycol, sodium chloride flush, sodium phosphate  Consultants:  None  Procedures:  None  Antibiotics:  Discontinued  Assessment/Plan: Principal Problem:   Acute pulmonary embolism (HCC) Active Problems:   Generalized anxiety disorder   HIV (human immunodeficiency virus infection) ([place])   Sickle cell pain crisis ([place])   Acute chest syndrome due to sickle cell crisis (HCC)  Sickle cell disease with pain crisis: Continue IV fluids at [place] weight-based Dilaudid PCA.  Settings changed to 0.5 mg, 10-minute lockout, and 2 mg/h.  Continue oxycodone 10 mg every 4 hours for severe breakthrough pain Monitor vital signs closely Supplemental oxygen as needed  Chronic pain syndrome: Continue home medications  Pulmonary embolism: Eliquis per pharmacy consult  CAP versus ACS: On admission, CTA chest was concerning for a peripheral nodular opacities seen within the left upper lung.  The findings could represent ACS or infectious etiology.  Patient has remained afebrile.  Chest pain improving. Continue incentive spirometer Discontinue IV antibiotics  HIV disease: Stable.  Continue home medications.  History of generalized anxiety: Continue Xanax.  Patient currently denies suicidal or homicidal ideations.   Constipation: Improved following enema.  Patient encouraged to ambulate and increase water intake.  Also, pain Dilaudid.     Code Status: Full Code Family Communication: N/A Disposition Plan: Not yet ready for discharge   [place], [name], [name] [facility] [address] [place],  [address] [phone]  If 5PM-7AM, please contact night-coverage.  02/13/2019, 6:05 PM  LOS: 3 days

## 2019-02-13 NOTE — Progress Notes (Signed)
PHARMACY NOTE -  Eliquis  Pharmacy has been assisting with dosing of Eliquis for new PE.  Dosage remains stable at 10 mg bid x 7 days followed by 5 mg bid and need for further dosage adjustment appears unlikely at present.  Pharmacy will sign off, following peripherally for adjustments. Please reconsult if a change in clinical status warrants re-evaluation of dosage.  Reuel Boom, PharmD, BCPS [phone] 02/13/2019, 1:54 PM

## 2019-02-14 DIAGNOSIS — D5701 Hb-SS disease with acute chest syndrome: Secondary | ICD-10-CM

## 2019-02-14 MED ORDER — DIPHENHYDRAMINE HCL 25 MG PO CAPS: 25.0000 mg | ORAL_CAPSULE | ORAL | Status: DC | PRN

## 2019-02-14 MED ORDER — OXYCODONE HCL 5 MG PO TABS
15.0000 mg | ORAL_TABLET | ORAL | Status: DC
  Administered 2019-02-14 (×4): 15 mg via ORAL
  Filled 2019-02-14 (×4): qty 3

## 2019-02-14 MED ORDER — HYDROMORPHONE HCL 1 MG/ML IJ SOLN
1.0000 mg | Freq: Four times a day (QID) | INTRAMUSCULAR | Status: DC | PRN
  Administered 2019-02-14 (×2): 1 mg via INTRAVENOUS
  Filled 2019-02-14 (×2): qty 1

## 2019-02-14 MED ORDER — HEPARIN SOD (PORK) LOCK FLUSH 100 UNIT/ML IV SOLN
500.0000 [IU] | INTRAVENOUS | Status: AC | PRN
  Administered 2019-02-14: 500 [IU]
  Filled 2019-02-14: qty 5

## 2019-02-14 NOTE — Progress Notes (Addendum)
Pt called this RN to room stating that he had a family emergency and he needs to leave tonight. Risks of leaving AMA explained to pt, including risk of going home without a prescription for eliquis. Pt verbalizes understanding of risks and still wishes to leave AMA. Paperwork signed. AC and NP on call, Baltazar Najjar, notified of pt's decision. IV team paged to deaccess port.

## 2019-02-14 NOTE — Progress Notes (Signed)
Subjective: Martin [name], a 28 year old male with a medical history significant for sickle cell disease, HIV on antiretroviral therapy, history of generalized anxiety, and history of anemia of chronic disease was admitted for pulmonary embolism in the setting of sickle cell pain crisis.  Patient continues to have pain primarily to left chest, low back, and bilateral lower extremities.  Patient says that pain has improved moderately.  Pain intensity is 7/10.  Patient is not maximizing IV Dilaudid PCA.  Patient has been transitioned to Eliquis for management of pulmonary embolism.  He denies headache, dizziness, paresthesias, dysuria, nausea, vomiting, or constipation.  Objective:  Vital signs in last 24 hours:  Vitals:   02/14/19 0147 02/14/19 0435 02/14/19 0436 02/14/19 1258  BP:  108/69  117/70  Pulse:  (!) 108  95  Resp: 12 14 14 16   Temp:  98.7 F (37.1 C)  98.6 F (37 C)  TempSrc:  Oral  Oral  SpO2: 96% 96% 96% 92%  Weight:      Height:        Intake/Output from previous day:   Intake/Output Summary (Last 24 hours) at 02/14/2019 1308 Last data filed at 02/14/2019 0740 Gross per 24 hour  Intake 1190 ml  Output 1350 ml  Net -160 ml    Physical Exam: General: Alert, awake, oriented x3, in no acute distress.  HEENT: [place]/AT PEERL, EOMI Neck: Trachea midline,  no masses, no thyromegal,y no JVD, no carotid bruit OROPHARYNX:  Moist, No exudate/ erythema/lesions.  Heart: Regular rate and rhythm, without murmurs, rubs, gallops, PMI non-displaced, no heaves or thrills on palpation.  Lungs: Clear to auscultation, no wheezing or rhonchi noted. No increased vocal fremitus resonant to percussion  Abdomen: Soft, nontender, nondistended, positive bowel sounds, no masses no hepatosplenomegaly noted..  Neuro: No focal neurological deficits noted cranial nerves II through XII grossly intact. DTRs 2+ bilaterally upper and lower extremities. Strength 5 out of 5 in bilateral upper and  lower extremities. Musculoskeletal: No warm swelling or erythema around joints, no spinal tenderness noted. Psychiatric: Patient alert and oriented x3, good insight and cognition, good recent to remote recall. Lymph node survey: No cervical axillary or inguinal lymphadenopathy noted.  Lab Results:  Basic Metabolic Panel:    Component Value Date/Time   NA 138 02/09/2019 2032   NA 136 10/17/2018 1433   K 3.8 02/09/2019 2032   CL 106 02/09/2019 2032   CO2 24 02/09/2019 2032   BUN 9 02/09/2019 2032   BUN 6 10/17/2018 1433   CREATININE 0.92 02/09/2019 2032   GLUCOSE 99 02/09/2019 2032   CALCIUM 8.9 02/09/2019 2032   CBC:    Component Value Date/Time   WBC 14.0 (H) 02/09/2019 2032   HGB 8.6 (L) 02/09/2019 2032   HGB 9.6 (L) 10/17/2018 1433   HCT 23.8 (L) 02/09/2019 2032   HCT 26.2 (L) 10/17/2018 1433   PLT 413 (H) 02/09/2019 2032   PLT 297 10/17/2018 1433   MCV 107.7 (H) 02/09/2019 2032   MCV 108 (H) 10/17/2018 1433   NEUTROABS 5.5 02/09/2019 2032   NEUTROABS 7.0 10/17/2018 1433   LYMPHSABS 6.4 (H) 02/09/2019 2032   LYMPHSABS 4.6 (H) 10/17/2018 1433   MONOABS 1.7 (H) 02/09/2019 2032   EOSABS 0.2 02/09/2019 2032   EOSABS 0.2 10/17/2018 1433   BASOSABS 0.1 02/09/2019 2032   BASOSABS 0.1 10/17/2018 1433    Recent Results (from the past 240 hour(s))  SARS Coronavirus 2 by RT PCR (hospital order, performed in [facility] hospital lab)  Nasopharyngeal Nasopharyngeal Swab     Status: None   Collection Time: 02/10/19  3:20 AM   Specimen: Nasopharyngeal Swab  Result Value Ref Range Status   SARS Coronavirus 2 NEGATIVE NEGATIVE Final    Comment: (NOTE) If result is NEGATIVE SARS-CoV-2 target nucleic acids are NOT DETECTED. The SARS-CoV-2 RNA is generally detectable in upper and lower  respiratory specimens during the acute phase of infection. The lowest  concentration of SARS-CoV-2 viral copies this assay can detect is 250  copies / mL. A negative result does not preclude  SARS-CoV-2 infection  and should not be used as the sole basis for treatment or other  patient management decisions.  A negative result may occur with  improper specimen collection / handling, submission of specimen other  than nasopharyngeal swab, presence of viral mutation(s) within the  areas targeted by this assay, and inadequate number of viral copies  (<250 copies / mL). A negative result must be combined with clinical  observations, patient history, and epidemiological information. If result is POSITIVE SARS-CoV-2 target nucleic acids are DETECTED. The SARS-CoV-2 RNA is generally detectable in upper and lower  respiratory specimens dur ing the acute phase of infection.  Positive  results are indicative of active infection with SARS-CoV-2.  Clinical  correlation with patient history and other diagnostic information is  necessary to determine patient infection status.  Positive results do  not rule out bacterial infection or co-infection with other viruses. If result is PRESUMPTIVE POSTIVE SARS-CoV-2 nucleic acids MAY BE PRESENT.   A presumptive positive result was obtained on the submitted specimen  and confirmed on repeat testing.  While 2019 novel coronavirus  (SARS-CoV-2) nucleic acids may be present in the submitted sample  additional confirmatory testing may be necessary for epidemiological  and / or clinical management purposes  to differentiate between  SARS-CoV-2 and other Sarbecovirus currently known to infect humans.  If clinically indicated additional testing with an alternate test  methodology [phone]) is advised. The SARS-CoV-2 RNA is generally  detectable in upper and lower respiratory sp ecimens during the acute  phase of infection. The expected result is Negative. Fact Sheet for Patients:  StrictlyIdeas.no Fact Sheet for Healthcare Providers: BankingDealers.co.za This test is not yet approved or cleared by the  [place] FDA and has been authorized for detection and/or diagnosis of SARS-CoV-2 by FDA under an Emergency Use Authorization (EUA).  This EUA will remain in effect (meaning this test can be used) for the duration of the COVID-19 declaration under Section 564(b)(1) of the Act, 21 U.S.C. section 360bbb-3(b)(1), unless the authorization is terminated or revoked sooner. Performed at [facility], [place] [address] [place], [place] [address]   Blood culture (routine x 2)     Status: None (Preliminary result)   Collection Time: 02/10/19  3:20 AM   Specimen: BLOOD  Result Value Ref Range Status   Specimen Description   Final    BLOOD BLOOD LEFT FOREARM Performed at [facility] [address], [place], [place] [address]    Special Requests   Final    BOTTLES DRAWN AEROBIC AND ANAEROBIC Blood Culture adequate volume Performed at [facility] [address] [place], [place] [address]    Culture   Final    NO GROWTH 4 DAYS Performed at [facility], [place] [address], [place], [place] [address]    Report Status PENDING  Incomplete  Blood culture (routine x 2)     Status:  None (Preliminary result)   Collection Time: 02/10/19  3:20 AM   Specimen: BLOOD  Result Value Ref Range Status   Specimen Description   Final    BLOOD PORTA CATH Performed at [facility], [address] [place], [place] [address]    Special Requests   Final    BOTTLES DRAWN AEROBIC AND ANAEROBIC Blood Culture adequate volume Performed at [facility], [address] [place], [place] [address]    Culture   Final    NO GROWTH 4 DAYS Performed at [facility], [address] [place], [place] [address]    Report Status PENDING  Incomplete    Studies/Results: No results found.  Medications: Scheduled Meds: . abacavir-dolutegravir-lamiVUDine  1 tablet Oral Daily  . apixaban  10 mg Oral BID    Followed by  . [START ON 02/17/2019] apixaban  5 mg Oral BID  . Chlorhexidine Gluconate Cloth  6 each Topical Daily  . hydroxyurea  2,000 mg Oral QHS  . oxyCODONE  15 mg Oral Q4H while awake  . senna-docusate  1 tablet Oral BID  . sodium chloride flush  10-40 mL Intracatheter Q12H   Continuous Infusions: PRN Meds:.ALPRAZolam, polyethylene glycol, sodium chloride flush, sodium phosphate  Consultants:  None  Procedures:  None  Antibiotics:  None  Assessment/Plan: Principal Problem:   Acute pulmonary embolism (HCC) Active Problems:   Generalized anxiety disorder   HIV (human immunodeficiency virus infection) (HCC)   Sickle cell pain crisis (HCC)   Acute chest syndrome due to sickle cell crisis (HCC)  Sickle cell disease with pain crisis:  [facility] Discontinue Dilaudid PCA Oxycodone 15 mg every 4 hours while awake Monitor vital signs closely Supplemental oxygen as needed  Chronic pain syndrome:  Continue home medications  Pulmonary embolism:  Continue Eliquis  CAP vs ACS:  Resolved. Patient continues to be afebrile. No further antibiotics warranted.   History of generalized anxiety:  Continue Xanax. Patient currently denies suicidal or homicidal ideations.   Constipation:  Resolved following enema   Code Status: Full Code Family Communication: N/A Disposition Plan: Not yet ready for discharge  Nolon NationsLachina Moore Tamitha Norell  APRN, [name], [name] [facility] [address]  [place], [place] [address] [phone]  If 5PM-7AM, please contact night-coverage.  02/14/2019, 1:08 PM  LOS: 4 days

## 2019-02-14 NOTE — Plan of Care (Signed)

## 2019-02-15 ENCOUNTER — Telehealth: Payer: Self-pay

## 2019-02-15 LAB — CULTURE, BLOOD (ROUTINE X 2)
Culture: NO GROWTH
Culture: NO GROWTH
Special Requests: ADEQUATE
Special Requests: ADEQUATE

## 2019-02-15 NOTE — Telephone Encounter (Signed)
Refill request for xanax and oxycodone. Please advise.

## 2019-02-16 ENCOUNTER — Other Ambulatory Visit: Payer: Self-pay | Admitting: Family Medicine

## 2019-02-16 DIAGNOSIS — D571 Sickle-cell disease without crisis: Secondary | ICD-10-CM

## 2019-02-16 DIAGNOSIS — F411 Generalized anxiety disorder: Secondary | ICD-10-CM

## 2019-02-16 DIAGNOSIS — I2699 Other pulmonary embolism without acute cor pulmonale: Secondary | ICD-10-CM

## 2019-02-16 DIAGNOSIS — G8929 Other chronic pain: Secondary | ICD-10-CM

## 2019-02-16 MED ORDER — OXYCODONE-ACETAMINOPHEN 10-325 MG PO TABS: 1.0000 | ORAL_TABLET | ORAL | 0 refills | Status: DC | PRN

## 2019-02-16 MED ORDER — APIXABAN 5 MG PO TABS: 5.0000 mg | ORAL_TABLET | Freq: Two times a day (BID) | ORAL | 5 refills | Status: DC

## 2019-02-16 MED ORDER — ALPRAZOLAM 1 MG PO TABS: 1.0000 mg | ORAL_TABLET | Freq: Two times a day (BID) | ORAL | 0 refills | Status: DC | PRN

## 2019-02-16 NOTE — Discharge Summary (Signed)
Physician Discharge Summary  [name]:[id number] DOB: [birth year] DOA: 02/09/2019  PCP: [name], [name]  Admit date: 02/09/2019  Discharge date: 02/16/2019  Discharge Diagnoses:  Principal Problem:   Acute pulmonary embolism (HCC) Active Problems:   Generalized anxiety disorder   HIV (human immunodeficiency virus infection) (HCC)   Sickle cell pain crisis (HCC)   Acute chest syndrome due to sickle cell crisis [facility])   Discharge Condition: Left AGAINST MEDICAL ADVICE  Disposition:  Pt is discharged home in good condition and is to follow up with [name], [name] this week to have labs evaluated. [name] is instructed to increase activity slowly and balance with rest for the next few days, and use prescribed medication to complete treatment of pain  Diet: Regular Wt Readings from Last 3 Encounters:  02/10/19 68 kg  01/16/19 67 kg  12/16/18 68.9 kg    History of present illness:  Martin [name], is a 28 year old male with a medical history significant for sickle cell anemia, HIV, generalized anxiety presented to ER with severe pain in low back and left upper chest.  Low back pain started to become severe on yesterday.  Pain was initially improved with oxycodone and ibuprofen, but progressed throughout the course of the day.  He says that back pain is consistent with his typical pain crisis, but he also notes pleuritic pain in upper left chest.  Pleuritic chest pain has been bothering him for approximately 1 day, but he reports similar pain in that same spot approximately 1 month ago that was.  And much less severe.  Patient has a 9 productive cough that is characterized as mild, but has not felt particularly short of breath.  He denies fevers or chills.  He denies lower extremity swelling or tenderness.  ER course: Upon arrival to the ER, patient found to be afebrile.  Saturating 94% on RA, mildly tachypneic, and with stable blood pressure.   Chest x-ray negative for acute findings.  Chemistry notable for bilirubin of 5.8.  CBC features a leukocytosis of 14,000, mild thrombocytosis, and stable macrocytic anemia.  High-sensitivity troponin normal.  D-dimer elevated 2.47.  CTA chest is concerning for partially occlusive posterior right lower lobe segmental and subsegmental pulmonary embolism as well as peripheral nodular opacity in the left upper lobe that could reflect acute chest syndrome/infection.  Blood cultures collected.  Patient was treated with multiple doses of IV Dilaudid in the ER, Zofran, Rocephin, azithromycin, and was started on heparin, IV infusion.   Hospital Course:  Patient was admitted for pulmonary embolism in the setting of sickle cell pain crisis.  He was managed appropriately with sickle cell pain  with IVF, IV Dilaudid via PCA and IV Toradol, as well as other adjunct therapies per sickle cell pain management protocols.  IV Dilaudid PCA was weaned appropriately.  Patient was transitioned to home medications prior to leaving AGAINST MEDICAL ADVICE.  CAP versus acute chest syndrome, patient was treated empirically with IV antibiotics.  Antibiotics were continued throughout admission.  Antibiotics were discontinued prior to patient leaving AGAINST MEDICAL ADVICE.  Pulmonary embolism: Patient was treated with IV heparin for 24 hours.  He was transitioned to Eliquis per pharmacy consult.  Patient was tolerating Eliquis well.  Will send Eliquis to pharmacy despite leaving AMA patient will need to be on this medication for 6 months.  Also, will notify patient to follow-up with [name], [name] in 2 weeks.  Patient has a history of HIV.  He  is followed by outpatient infectious disease.  Antiviral medications were continued throughout admission without interruption.  Patient was hemodynamically stable prior to leaving [place].    Discharge Exam: Vitals:   02/14/19 2136 02/14/19 2158  BP: 103/70   Pulse:  90   Resp: 16   Temp: 99.1 F (37.3 C)   SpO2: (!) 85% (!) 89%   Vitals:   02/14/19 0436 02/14/19 1258 02/14/19 2136 02/14/19 2158  BP:  117/70 103/70   Pulse:  95 90   Resp: 14 16 16    Temp:  98.6 F (37 C) 99.1 F (37.3 C)   TempSrc:  Oral Oral   SpO2: 96% 92% (!) 85% (!) 89%  Weight:      Height:           Allergies as of 02/15/2019      Reactions   Ketorolac Swelling   Patient reports facial edema and left arm edema after administration.      Medication List    ASK your doctor about these medications   ALPRAZolam 1 MG tablet Commonly known as: XANAX Take 1 tablet (1 mg total) by mouth 2 (two) times daily as needed for anxiety.   cetirizine 10 MG chewable tablet Commonly known as: ZYRTEC Chew 10 mg by mouth daily as needed for allergies.   diphenhydrAMINE 25 MG tablet Commonly known as: BENADRYL Take 25 mg by mouth every 6 (six) hours as needed for allergies.   hydroxyurea 500 MG capsule Commonly known as: HYDREA Take 4 capsules (2,000 mg total) by mouth at bedtime.   multivitamin with minerals Tabs tablet Take 1 tablet by mouth daily.   naloxone 0.4 MG/ML injection Commonly known as: NARCAN As needed.   Triumeq 600-50-300 MG tablet Generic drug: abacavir-dolutegravir-lamiVUDine Take 1 tablet by mouth daily.   Vitamin D (Ergocalciferol) 1.25 MG (50000 UT) Caps capsule Commonly known as: DRISDOL Take 1 capsule (50,000 Units total) by mouth every 7 (seven) days.       The results of significant diagnostics from this hospitalization (including imaging, microbiology, ancillary and laboratory) are listed below for reference.    Significant Diagnostic Studies: Dg Chest 2 View  Result Date: 02/09/2019 CLINICAL DATA:  Sickle cell crisis. Mid chest pain. EXAM: CHEST - 2 VIEW COMPARISON:  01/17/2019 FINDINGS: Right chest port remains in place with tip in the SVC. Chronic interstitial opacities and scarring, not significantly changed from prior exam. No  acute airspace disease. Upper normal heart size with normal mediastinal contours. No pleural effusion or pneumothorax. No acute osseous abnormalities. IMPRESSION: Stable appearance of the chest with chronic interstitial opacities and scarring. No superimposed acute abnormality. Electronically Signed   By: Keith Rake M.D.   On: 02/09/2019 21:39   Ct Angio Chest Pe W And/or Wo Contrast  Result Date: 02/10/2019 CLINICAL DATA:  Chest pain, history of sickle cell EXAM: CT ANGIOGRAPHY CHEST WITH CONTRAST TECHNIQUE: Multidetector CT imaging of the chest was performed using the standard protocol during bolus administration of intravenous contrast. Multiplanar CT image reconstructions and MIPs were obtained to evaluate the vascular anatomy. CONTRAST:  132mL OMNIPAQUE IOHEXOL 350 MG/ML SOLN COMPARISON:  Radiograph same day FINDINGS: Cardiovascular: There is slightly suboptimal opacification of the main pulmonary artery. There is partially occlusive thrombus within the posterior right lower lobe segmental and subsegmental arterial branches. There is limited visualization of the subsegmental branches however. There is mild cardiomegaly. No evidence of right ventricular heart strain. No pericardial effusion. There is normal three-vessel brachiocephalic anatomy  without proximal stenosis. The thoracic aorta is normal in appearance. Mediastinum/Nodes: No hilar, mediastinal, or axillary adenopathy. Thyroid gland, trachea, and esophagus demonstrate no significant findings. Lungs/Pleura: There is patchy round nodular opacity seen in the posterior left upper lung and small ground-glass opacities within the anterior left lower lobe. There are increased interstitial opacities with bronchiectasis and ground-glass seen throughout both lung bases. No pleural effusion. Upper Abdomen: Coarse calcifications within the region of probable atrophy spleen. Musculoskeletal: No chest wall abnormality. No acute or significant osseous  findings. Review of the MIP images confirms the above findings. IMPRESSION: 1. Slightly suboptimal opacification of the main pulmonary artery. Partially occlusive posterior right lower lobe segmental and subsegmental pulmonary embolism. 2. Peripheral nodular opacities seen within the left upper lung. The findings could represent acute chest syndrome/infectious etiology. 3. Chronic interstitial lung changes/scarring at both lung bases. 4. These results were called by telephone at the time of interpretation on 02/10/2019 at 2:57 am to provider Roxy Horseman , who verbally acknowledged these results. Electronically Signed   By: Jonna Clark M.D.   On: 02/10/2019 03:03    Microbiology: Recent Results (from the past 240 hour(s))  SARS Coronavirus 2 by RT PCR (hospital order, performed in [facility] hospital lab) Nasopharyngeal Nasopharyngeal Swab     Status: None   Collection Time: 02/10/19  3:20 AM   Specimen: Nasopharyngeal Swab  Result Value Ref Range Status   SARS Coronavirus 2 NEGATIVE NEGATIVE Final    Comment: (NOTE) If result is NEGATIVE SARS-CoV-2 target nucleic acids are NOT DETECTED. The SARS-CoV-2 RNA is generally detectable in upper and lower  respiratory specimens during the acute phase of infection. The lowest  concentration of SARS-CoV-2 viral copies this assay can detect is 250  copies / mL. A negative result does not preclude SARS-CoV-2 infection  and should not be used as the sole basis for treatment or other  patient management decisions.  A negative result may occur with  improper specimen collection / handling, submission of specimen other  than nasopharyngeal swab, presence of viral mutation(s) within the  areas targeted by this assay, and inadequate number of viral copies  (<250 copies / mL). A negative result must be combined with clinical  observations, patient history, and epidemiological information. If result is POSITIVE SARS-CoV-2 target nucleic acids are  DETECTED. The SARS-CoV-2 RNA is generally detectable in upper and lower  respiratory specimens dur ing the acute phase of infection.  Positive  results are indicative of active infection with SARS-CoV-2.  Clinical  correlation with patient history and other diagnostic information is  necessary to determine patient infection status.  Positive results do  not rule out bacterial infection or co-infection with other viruses. If result is PRESUMPTIVE POSTIVE SARS-CoV-2 nucleic acids MAY BE PRESENT.   A presumptive positive result was obtained on the submitted specimen  and confirmed on repeat testing.  While 2019 novel coronavirus  (SARS-CoV-2) nucleic acids may be present in the submitted sample  additional confirmatory testing may be necessary for epidemiological  and / or clinical management purposes  to differentiate between  SARS-CoV-2 and other Sarbecovirus currently known to infect humans.  If clinically indicated additional testing with an alternate test  methodology [phone]) is advised. The SARS-CoV-2 RNA is generally  detectable in upper and lower respiratory sp ecimens during the acute  phase of infection. The expected result is Negative. Fact Sheet for Patients:  BoilerBrush.com.cy Fact Sheet for Healthcare Providers: https://pope.com/ This test is not yet approved or cleared  by the [place] FDA and has been authorized for detection and/or diagnosis of SARS-CoV-2 by FDA under an Emergency Use Authorization (EUA).  This EUA will remain in effect (meaning this test can be used) for the duration of the COVID-19 declaration under Section 564(b)(1) of the Act, 21 U.S.C. section 360bbb-3(b)(1), unless the authorization is terminated or revoked sooner. Performed at [facility], [address] [place], [place] [address]   Blood culture (routine x 2)     Status: None   Collection Time: 02/10/19  3:20 AM    Specimen: BLOOD  Result Value Ref Range Status   Specimen Description   Final    BLOOD BLOOD LEFT FOREARM Performed at [facility], [address] [place], [place] [address]    Special Requests   Final    BOTTLES DRAWN AEROBIC AND ANAEROBIC Blood Culture adequate volume Performed at [facility], [address] [place], [place] [address]    Culture   Final    NO GROWTH 5 DAYS Performed at [facility], [address] [place], [place] [address]    Report Status 02/15/2019 FINAL  Final  Blood culture (routine x 2)     Status: None   Collection Time: 02/10/19  3:20 AM   Specimen: BLOOD  Result Value Ref Range Status   Specimen Description   Final    BLOOD PORTA CATH Performed at [facility], [address] [place], [place] [address]    Special Requests   Final    BOTTLES DRAWN AEROBIC AND ANAEROBIC Blood Culture adequate volume Performed at [facility], [address] [place], [place] [address]    Culture   Final    NO GROWTH 5 DAYS Performed at [facility], [address] [place], [place] [address]    Report Status 02/15/2019 FINAL  Final     Labs: Basic Metabolic Panel: Recent Labs  Lab 02/09/19 2032  NA 138  K 3.8  CL 106  CO2 24  GLUCOSE 99  BUN 9  CREATININE 0.92  CALCIUM 8.9   Liver Function Tests: Recent Labs  Lab 02/09/19 2032  AST 21  ALT 15  ALKPHOS 58  BILITOT 5.8*  PROT 8.5*  ALBUMIN 3.9   No results for input(s): LIPASE, AMYLASE in the last 168 hours. No results for input(s): AMMONIA in the last 168 hours. CBC: Recent Labs  Lab 02/09/19 2032  WBC 14.0*  NEUTROABS 5.5  HGB 8.6*  HCT 23.8*  MCV 107.7*  PLT 413*   Cardiac Enzymes: No results for input(s): CKTOTAL, CKMB, CKMBINDEX, TROPONINI in the last 168 hours. BNP: Invalid input(s): POCBNP CBG: No results for input(s): GLUCAP in the last 168 hours.  Time coordinating discharge:  Patient left AGAINST MEDICAL ADVICE  Signed:  Nolon NationsLachina Moore Amed Datta  APRN, [name], [name] [facility] [address]  [place], [place] [address] [phone]  [facility] 02/16/2019, 5:17 PM    This note was prepared using Dragon speech recognition software, errors in dictation are unintentional.

## 2019-02-16 NOTE — Telephone Encounter (Signed)
Patient informed but I don't see the xanax sent in. Can you check on that one for me?

## 2019-02-16 NOTE — Progress Notes (Signed)
Meds ordered this encounter  Medications   apixaban (ELIQUIS) 5 MG TABS tablet    Sig: Take 1 tablet (5 mg total) by mouth 2 (two) times daily.    Dispense:  60 tablet    Refill:  5    Order Specific Question:   Supervising Provider    Answer:   JEGEDE, OLUGBEMIGA E [[id number]]   Kearra Calkin Moore Betta Balla  APRN, [name], [name] [facility] [address]  [place], [place] [address] [phone]  

## 2019-02-17 ENCOUNTER — Telehealth: Payer: Self-pay

## 2019-02-17 NOTE — Telephone Encounter (Signed)
Called, no answer. Voicemail was not set up. Will try later.  

## 2019-02-17 NOTE — Telephone Encounter (Signed)
Called and informed about xanax rx. Thanks!

## 2019-02-17 NOTE — Telephone Encounter (Signed)
Called no answer. No voicemail. Will try later.

## 2019-03-07 ENCOUNTER — Other Ambulatory Visit: Payer: Self-pay | Admitting: Family Medicine

## 2019-03-07 ENCOUNTER — Telehealth: Payer: Self-pay | Admitting: Family Medicine

## 2019-03-07 DIAGNOSIS — D571 Sickle-cell disease without crisis: Secondary | ICD-10-CM

## 2019-03-07 DIAGNOSIS — G8929 Other chronic pain: Secondary | ICD-10-CM

## 2019-03-07 MED ORDER — OXYCODONE-ACETAMINOPHEN 10-325 MG PO TABS: 1.0000 | ORAL_TABLET | ORAL | 0 refills | Status: DC | PRN

## 2019-03-07 MED ORDER — HYDROXYUREA 500 MG PO CAPS: 2000.0000 mg | ORAL_CAPSULE | Freq: Every day | ORAL | 3 refills | Status: DC

## 2019-03-07 NOTE — Telephone Encounter (Signed)
Please inform patient. Thank you

## 2019-03-13 ENCOUNTER — Other Ambulatory Visit: Payer: Self-pay

## 2019-03-13 ENCOUNTER — Encounter (HOSPITAL_COMMUNITY): Payer: Self-pay

## 2019-03-13 DIAGNOSIS — M545 Low back pain: Secondary | ICD-10-CM | POA: Diagnosis present

## 2019-03-13 DIAGNOSIS — Z79899 Other long term (current) drug therapy: Secondary | ICD-10-CM | POA: Diagnosis not present

## 2019-03-13 DIAGNOSIS — Z7901 Long term (current) use of anticoagulants: Secondary | ICD-10-CM | POA: Diagnosis not present

## 2019-03-13 DIAGNOSIS — Z21 Asymptomatic human immunodeficiency virus [HIV] infection status: Secondary | ICD-10-CM | POA: Insufficient documentation

## 2019-03-13 DIAGNOSIS — D5700 Hb-SS disease with crisis, unspecified: Secondary | ICD-10-CM | POA: Diagnosis not present

## 2019-03-13 MED ORDER — SODIUM CHLORIDE 0.9% FLUSH
3.0000 mL | Freq: Once | INTRAVENOUS | Status: AC
  Administered 2019-03-14: 3 mL via INTRAVENOUS

## 2019-03-13 NOTE — ED Triage Notes (Signed)
Patient arrived stating he is having a sickle cell pain crisis. Reporting generalized pain that started earlier today. Last home medication taken at 6pm.

## 2019-03-13 NOTE — ED Notes (Signed)
Pt request for labs to be drawn of port. RN advised. [employer]

## 2019-03-14 ENCOUNTER — Encounter (HOSPITAL_COMMUNITY): Payer: Self-pay | Admitting: *Deleted

## 2019-03-14 ENCOUNTER — Emergency Department (HOSPITAL_COMMUNITY)
Admission: EM | Admit: 2019-03-14 | Discharge: 2019-03-14 | Disposition: A | Discharge status: Home / Self Care | Payer: Medicaid - Out of State | Attending: Emergency Medicine | Admitting: Emergency Medicine

## 2019-03-14 ENCOUNTER — Telehealth (HOSPITAL_COMMUNITY): Payer: Self-pay | Admitting: *Deleted

## 2019-03-14 ENCOUNTER — Non-Acute Institutional Stay (HOSPITAL_BASED_OUTPATIENT_CLINIC_OR_DEPARTMENT_OTHER)
Admission: AD | Admit: 2019-03-14 | Discharge: 2019-03-14 | Disposition: A | Discharge status: Home / Self Care | Payer: Medicaid - Out of State | Source: Ambulatory Visit | Attending: Internal Medicine | Admitting: Internal Medicine

## 2019-03-14 DIAGNOSIS — D5700 Hb-SS disease with crisis, unspecified: Secondary | ICD-10-CM | POA: Diagnosis present

## 2019-03-14 LAB — CBC WITH DIFFERENTIAL/PLATELET
Abs Immature Granulocytes: 0.04 10*3/uL (ref 0.00–0.07)
Basophils Absolute: 0.1 10*3/uL (ref 0.0–0.1)
Basophils Relative: 0 %
Eosinophils Absolute: 0.2 10*3/uL (ref 0.0–0.5)
Eosinophils Relative: 2 %
HCT: 26.4 % — ABNORMAL LOW (ref 39.0–52.0)
Hemoglobin: 9.3 g/dL — ABNORMAL LOW (ref 13.0–17.0)
Immature Granulocytes: 0 %
Lymphocytes Relative: 52 %
Lymphs Abs: 6.3 10*3/uL — ABNORMAL HIGH (ref 0.7–4.0)
MCH: 39.4 pg — ABNORMAL HIGH (ref 26.0–34.0)
MCHC: 35.2 g/dL (ref 30.0–36.0)
MCV: 111.9 fL — ABNORMAL HIGH (ref 80.0–100.0)
Monocytes Absolute: 1.4 10*3/uL — ABNORMAL HIGH (ref 0.1–1.0)
Monocytes Relative: 11 %
Neutro Abs: 4.2 10*3/uL (ref 1.7–7.7)
Neutrophils Relative %: 35 %
Platelets: 368 10*3/uL (ref 150–400)
RBC: 2.36 MIL/uL — ABNORMAL LOW (ref 4.22–5.81)
RDW: 21.8 % — ABNORMAL HIGH (ref 11.5–15.5)
WBC: 12.1 10*3/uL — ABNORMAL HIGH (ref 4.0–10.5)
nRBC: 0.9 % — ABNORMAL HIGH (ref 0.0–0.2)

## 2019-03-14 LAB — COMPREHENSIVE METABOLIC PANEL
ALT: 22 U/L (ref 0–44)
AST: 27 U/L (ref 15–41)
Albumin: 4 g/dL (ref 3.5–5.0)
Alkaline Phosphatase: 62 U/L (ref 38–126)
Anion gap: 7 (ref 5–15)
BUN: 10 mg/dL (ref 6–20)
CO2: 25 mmol/L (ref 22–32)
Calcium: 8.9 mg/dL (ref 8.9–10.3)
Chloride: 109 mmol/L (ref 98–111)
Creatinine, Ser: 0.68 mg/dL (ref 0.61–1.24)
GFR calc Af Amer: >60 mL/min (ref >60)
GFR calc non Af Amer: >60 mL/min (ref >60)
Glucose, Bld: 96 mg/dL (ref 70–99)
Potassium: 4 mmol/L (ref 3.5–5.1)
Sodium: 141 mmol/L (ref 135–145)
Total Bilirubin: 5.5 mg/dL — ABNORMAL HIGH (ref 0.3–1.2)
Total Protein: 8.3 g/dL — ABNORMAL HIGH (ref 6.5–8.1)

## 2019-03-14 LAB — RETICULOCYTES
Immature Retic Fract: 22.7 % — ABNORMAL HIGH (ref 2.3–15.9)
RBC.: 2.36 MIL/uL — ABNORMAL LOW (ref 4.22–5.81)
Retic Count, Absolute: 649 10*3/uL — ABNORMAL HIGH (ref 19.0–186.0)
Retic Ct Pct: 26 % — ABNORMAL HIGH (ref 0.4–3.1)

## 2019-03-14 MED ORDER — HEPARIN SOD (PORK) LOCK FLUSH 100 UNIT/ML IV SOLN
INTRAVENOUS | Status: AC
  Administered 2019-03-14: 500 [IU]
  Filled 2019-03-14: qty 5

## 2019-03-14 MED ORDER — HYDROMORPHONE HCL 2 MG/ML IJ SOLN: 2.0000 mg | INTRAMUSCULAR | Status: DC

## 2019-03-14 MED ORDER — NALOXONE HCL 0.4 MG/ML IJ SOLN: 0.4000 mg | INTRAMUSCULAR | Status: DC | PRN

## 2019-03-14 MED ORDER — HYDROMORPHONE HCL 2 MG/ML IJ SOLN
2.0000 mg | INTRAMUSCULAR | Status: AC
  Administered 2019-03-14: 2 mg via INTRAVENOUS
  Filled 2019-03-14: qty 1

## 2019-03-14 MED ORDER — ONDANSETRON HCL 4 MG/2ML IJ SOLN: 4.0000 mg | Freq: Four times a day (QID) | INTRAMUSCULAR | Status: DC | PRN

## 2019-03-14 MED ORDER — POLYETHYLENE GLYCOL 3350 17 G PO PACK: 17.0000 g | PACK | Freq: Every day | ORAL | Status: DC | PRN

## 2019-03-14 MED ORDER — SODIUM CHLORIDE 0.9% FLUSH
10.0000 mL | INTRAVENOUS | Status: AC | PRN
  Administered 2019-03-14: 10 mL

## 2019-03-14 MED ORDER — PROMETHAZINE HCL 25 MG PO TABS: 25.0000 mg | ORAL_TABLET | ORAL | Status: DC | PRN

## 2019-03-14 MED ORDER — SODIUM CHLORIDE 0.9% FLUSH: 9.0000 mL | INTRAVENOUS | Status: DC | PRN

## 2019-03-14 MED ORDER — SODIUM CHLORIDE 0.45 % IV SOLN
INTRAVENOUS | Status: DC
  Administered 2019-03-14: 02:00:00 via INTRAVENOUS

## 2019-03-14 MED ORDER — DIPHENHYDRAMINE HCL 25 MG PO CAPS
25.0000 mg | ORAL_CAPSULE | ORAL | Status: DC | PRN
  Administered 2019-03-14: 50 mg via ORAL
  Filled 2019-03-14: qty 2

## 2019-03-14 MED ORDER — SODIUM CHLORIDE 0.9 % IV SOLN
25.0000 mg | INTRAVENOUS | Status: DC | PRN
  Filled 2019-03-14: qty 0.5

## 2019-03-14 MED ORDER — DIPHENHYDRAMINE HCL 25 MG PO CAPS
ORAL_CAPSULE | ORAL | Status: AC
  Filled 2019-03-14: qty 1

## 2019-03-14 MED ORDER — HYDROMORPHONE HCL 2 MG/ML IJ SOLN: 2.0000 mg | INTRAMUSCULAR | Status: AC

## 2019-03-14 MED ORDER — HEPARIN SOD (PORK) LOCK FLUSH 100 UNIT/ML IV SOLN
500.0000 [IU] | INTRAVENOUS | Status: AC | PRN
  Administered 2019-03-14: 500 [IU]
  Filled 2019-03-14: qty 5

## 2019-03-14 MED ORDER — SODIUM CHLORIDE 0.45 % IV SOLN
INTRAVENOUS | Status: DC
  Administered 2019-03-14: 10:00:00 via INTRAVENOUS

## 2019-03-14 MED ORDER — SENNOSIDES-DOCUSATE SODIUM 8.6-50 MG PO TABS: 1.0000 | ORAL_TABLET | Freq: Two times a day (BID) | ORAL | Status: DC

## 2019-03-14 MED ORDER — HYDROMORPHONE 1 MG/ML IV SOLN
INTRAVENOUS | Status: DC
  Administered 2019-03-14: 30 mg via INTRAVENOUS
  Administered 2019-03-14: 15 mg via INTRAVENOUS
  Filled 2019-03-14: qty 30

## 2019-03-14 MED ORDER — HYDROMORPHONE HCL 2 MG/ML IJ SOLN
2.0000 mg | INTRAMUSCULAR | Status: DC
  Administered 2019-03-14: 2 mg via INTRAVENOUS
  Filled 2019-03-14: qty 1

## 2019-03-14 MED ORDER — DIPHENHYDRAMINE HCL 25 MG PO CAPS: 25.0000 mg | ORAL_CAPSULE | ORAL | Status: DC | PRN

## 2019-03-14 NOTE — Progress Notes (Signed)
Patient admitted to the day infusion hospital for sickle cell pain crisis. Initially, patient reported generalized pain rated 10/10. For pain management, patient placed on Dilaudid PCA and hydrated with IV fluids. At discharge, patient rated pain at 5/10. Vital signs stable. Discharge instructions given. Patient alert, oriented and ambulatory at discharge.

## 2019-03-14 NOTE — H&P (Signed)
[facility] History and Physical  Martin [name] RJJ:[id number] DOB: [birth year] DOA: 03/14/2019  PCP: [name], [name]   Chief Complaint: Sickle cell pain  HPI: Martin [name] is a 28 y.o. male with history of sickle cell disease, opiate dependence, opiate tolerance, chronic pain syndrome, HIV on antiretroviral therapy, GERD history of generalized anxiety disorder who presented to the day hospital today with major complaints of pain largely localized to his lower back, consistent with his typical sickle cell pain crisis.  He rates his pain at 9/10, constant and throbbing, not relieved by his home pain medications.  He denies any sick contacts, recent travel or exposure to COVID-19.  He denies any fever, chest pain, cough, shortness of breath, dizziness, urinary symptoms, nausea, vomiting or diarrhea.  He claims adherence with his home medications.  Systemic Review: General: The patient denies anorexia, fever, weight loss Cardiac: Denies chest pain, syncope, palpitations, pedal edema  Respiratory: Denies cough, shortness of breath, wheezing GI: Denies severe indigestion/heartburn, abdominal pain, nausea, vomiting, diarrhea and constipation GU: Denies hematuria, incontinence, dysuria  Musculoskeletal: Denies arthritis  Skin: Denies suspicious skin lesions Neurologic: Denies focal weakness or numbness, change in vision  Past Medical History:  Diagnosis Date  . Anxiety   . HIV (human immunodeficiency virus infection) (HCC)   . Sickle cell crisis (HCC)   . 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HCC)   . Vitamin D deficiency 10/2018    No past surgical history on file.  Allergies  Allergen Reactions  . Ketorolac Swelling    Patient reports facial edema and left arm edema after administration.    Family History  Problem Relation Age of Onset  . Sickle cell trait Mother   . Sickle cell trait Father   . Birth defects Maternal Grandmother   . Birth defects Paternal  Grandmother       Prior to Admission medications   Medication Sig Start Date End Date Taking? Authorizing Provider  abacavir-dolutegravir-lamiVUDine (TRIUMEQ) 600-50-300 MG tablet Take 1 tablet by mouth daily.    [provider]  ALPRAZolam Prudy Feeler) 1 MG tablet Take 1 tablet (1 mg total) by mouth 2 (two) times daily as needed for anxiety. 02/16/19 03/18/19  [name], [name]  apixaban (ELIQUIS) 5 MG TABS tablet Take 1 tablet (5 mg total) by mouth 2 (two) times daily. 02/16/19   Massie Maroon, [name]  cetirizine (ZYRTEC) 10 MG chewable tablet Chew 10 mg by mouth daily as needed for allergies.     [provider]  diphenhydrAMINE (BENADRYL) 25 MG tablet Take 25 mg by mouth every 6 (six) hours as needed for allergies.    [provider]  hydroxyurea (HYDREA) 500 MG capsule Take 4 capsules (2,000 mg total) by mouth at bedtime. 03/07/19   [name], [name]  Multiple Vitamin (MULTIVITAMIN WITH MINERALS) TABS tablet Take 1 tablet by mouth daily.    [provider]  naloxone [facility]) 0.4 MG/ML injection As needed. Patient taking differently: Inject 0.4 mg into the muscle daily as needed (overdose).  11/28/18   [name], [name]  oxyCODONE-acetaminophen (PERCOCET) 10-325 MG tablet Take 1 tablet by mouth every 4 (four) hours as needed for up to 15 days for pain. 03/07/19 03/22/19  [name], [name]  Vitamin D, Ergocalciferol, (DRISDOL) 1.25 MG (50000 UT) CAPS capsule Take 1 capsule (50,000 Units total) by mouth every 7 (seven) days. Patient not taking: Reported on 02/09/2019 11/01/18   [name], [name]  Physical Exam: There were no vitals filed for this visit.  General: Alert, awake, afebrile, anicteric, not in obvious distress HEENT: Normocephalic and Atraumatic, Mucous membranes pink                PERRLA; EOM intact; No scleral icterus,                 Nares: Patent, Oropharynx: Clear, Fair Dentition                 Neck: FROM, no  cervical lymphadenopathy, thyromegaly, carotid bruit or JVD;  CHEST WALL: No tenderness  CHEST: Normal respiration, clear to auscultation bilaterally  HEART: Regular rate and rhythm; no murmurs rubs or gallops  BACK: No kyphosis or scoliosis; no CVA tenderness  ABDOMEN: Positive Bowel Sounds, soft, non-tender; no masses, no organomegaly EXTREMITIES: No cyanosis, clubbing, or edema SKIN:  no rash or ulceration  CNS: Alert and Oriented x 4, Nonfocal exam, CN 2-12 intact  Labs on Admission:  Basic Metabolic Panel: Recent Labs  Lab 03/14/19 0203  NA 141  K 4.0  CL 109  CO2 25  GLUCOSE 96  BUN 10  CREATININE 0.68  CALCIUM 8.9   Liver Function Tests: Recent Labs  Lab 03/14/19 0203  AST 27  ALT 22  ALKPHOS 62  BILITOT 5.5*  PROT 8.3*  ALBUMIN 4.0   No results for input(s): LIPASE, AMYLASE in the last 168 hours. No results for input(s): AMMONIA in the last 168 hours. CBC: Recent Labs  Lab 03/14/19 0203  WBC 12.1*  NEUTROABS 4.2  HGB 9.3*  HCT 26.4*  MCV 111.9*  PLT 368   Cardiac Enzymes: No results for input(s): CKTOTAL, CKMB, CKMBINDEX, TROPONINI in the last 168 hours.  BNP (last 3 results) No results for input(s): BNP in the last 8760 hours.  ProBNP (last 3 results) No results for input(s): PROBNP in the last 8760 hours.  CBG: No results for input(s): GLUCAP in the last 168 hours.   Assessment/Plan Active Problems:   Sickle cell anemia with pain ([place])   Admits to the Day Hospital  IVF D5 .45% Saline @ 125 mls/hour  Weight based Dilaudid PCA started within 30 minutes of admission  IV Toradol 15 mg Q 6 H  Monitor vitals very closely, Re-evaluate pain scale every hour  2 L of Oxygen by [place]  Patient will be re-evaluated for pain in the context of function and relationship to baseline as care progresses.  If no significant relieve from pain (remains above 5/10) will transfer patient to inpatient services for further evaluation and management  Code  Status: Full  Family Communication: None  DVT Prophylaxis: Ambulate as tolerated   Time spent: 35 Minutes  Angelica Chessman, MD, MHA, FACP, FAAP, CPE  If 7PM-7AM, please contact night-coverage [email or web address] 03/14/2019, 9:02 AM

## 2019-03-14 NOTE — Discharge Summary (Signed)
Physician Discharge Summary  ELVIS LAUFER SNK:[id number] DOB: 1990/10/12 DOA: 03/14/2019  PCP: Azzie Glatter, [name]  Admit date: 03/14/2019  Discharge date: 03/14/2019  Time spent: 30 minutes  Discharge Diagnoses:  Active Problems:   Sickle cell anemia with pain [facility])   Discharge Condition: Stable  Diet recommendation: Regular  History of present illness:  Martin [name] is a 28 y.o. male with history of sickle cell disease, opiate dependence, opiate tolerance, chronic pain syndrome, HIV on antiretroviral therapy, GERD history of generalized anxiety disorder who presented to the day hospital today with major complaints of pain largely localized to his lower back, consistent with his typical sickle cell pain crisis.  He rates his pain at 9/10, constant and throbbing, not relieved by his home pain medications.  He denies any sick contacts, recent travel or exposure to COVID-19. He denies any fever, chest pain, cough, shortness of breath, dizziness, urinary symptoms, nausea, vomiting or diarrhea.  He claims adherence with his home medications.  Hospital Course:  Martin [name] was admitted to the day hospital with sickle cell painful crisis. Patient was treated with weight based IV Dilaudid PCA, IV Toradol, clinician assisted doses as deemed appropriate and IV fluids. Kelyn showed significant improvement symptomatically, pain improved from 9 to 5/10 at the time of discharge. Patient was discharged home in a hemodynamically stable condition. Dearion will follow-up at the clinic as previously scheduled, continue with home medications as per prior to admission.  Discharge Instructions We discussed the need for good hydration, monitoring of hydration status, avoidance of heat, cold, stress, and infection triggers. We discussed the need to be compliant with taking Hydrea and other home medications. Brylan was reminded of the need to seek medical attention immediately if  any symptom of bleeding, anemia, or infection occurs.  Discharge Exam: Vitals:   03/14/19 1328 03/14/19 1527  BP: (!) 99/58 (!) 97/52  Pulse: 77 67  Resp: 12 10  Temp:  98 F (36.7 C)  SpO2: 96% 95%   General appearance: alert, cooperative and no distress Eyes: conjunctivae/corneas clear. PERRL, EOM's intact. Fundi benign. Neck: no adenopathy, no carotid bruit, no JVD, supple, symmetrical, trachea midline and thyroid not enlarged, symmetric, no tenderness/mass/nodules Back: symmetric, no curvature. ROM normal. No CVA tenderness. Resp: clear to auscultation bilaterally Chest wall: no tenderness Cardio: regular rate and rhythm, S1, S2 normal, no murmur, click, rub or gallop GI: soft, non-tender; bowel sounds normal; no masses,  no organomegaly Extremities: extremities normal, atraumatic, no cyanosis or edema Pulses: 2+ and symmetric Skin: Skin color, texture, turgor normal. No rashes or lesions Neurologic: Grossly normal  Discharge Instructions    Diet - low sodium heart healthy   Complete by: As directed    Increase activity slowly   Complete by: As directed      Allergies as of 03/14/2019      Reactions   Ketorolac Swelling   Patient reports facial edema and left arm edema after administration.      Medication List    TAKE these medications   ALPRAZolam 1 MG tablet Commonly known as: XANAX Take 1 tablet (1 mg total) by mouth 2 (two) times daily as needed for anxiety.   apixaban 5 MG Tabs tablet Commonly known as: Eliquis Take 1 tablet (5 mg total) by mouth 2 (two) times daily.   cetirizine 10 MG chewable tablet Commonly known as: ZYRTEC Chew 10 mg by mouth daily as needed for allergies.   diphenhydrAMINE 25 MG tablet Commonly known  as: BENADRYL Take 25 mg by mouth every 6 (six) hours as needed for allergies.   hydroxyurea 500 MG capsule Commonly known as: HYDREA Take 4 capsules (2,000 mg total) by mouth at bedtime.   multivitamin with minerals Tabs  tablet Take 1 tablet by mouth daily.   naloxone 0.4 MG/ML injection Commonly known as: NARCAN As needed. What changed:   how much to take  how to take this  when to take this  reasons to take this  additional instructions   oxyCODONE-acetaminophen 10-325 MG tablet Commonly known as: PERCOCET Take 1 tablet by mouth every 4 (four) hours as needed for up to 15 days for pain.   Triumeq 600-50-300 MG tablet Generic drug: abacavir-dolutegravir-lamiVUDine Take 1 tablet by mouth daily.   Vitamin D (Ergocalciferol) 1.25 MG (50000 UT) Caps capsule Commonly known as: DRISDOL Take 1 capsule (50,000 Units total) by mouth every 7 (seven) days.      Allergies  Allergen Reactions  . Ketorolac Swelling    Patient reports facial edema and left arm edema after administration.     Significant Diagnostic Studies: No results found.  Signed:  Jeanann Lewandowsky MD, MHA, FACP, Constance Goltz, CPE   03/14/2019, 4:07 PM

## 2019-03-14 NOTE — Discharge Instructions (Signed)
Sickle Cell Anemia, Adult ° °Sickle cell anemia is a condition where your red blood cells are shaped like sickles. Red blood cells carry oxygen through the body. Sickle-shaped cells do not live as long as normal red blood cells. They also clump together and block blood from flowing through the blood vessels. This prevents the body from getting enough oxygen. Sickle cell anemia causes organ damage and pain. It also increases the risk of infection. °Follow these instructions at home: °Medicines °· Take over-the-counter and prescription medicines only as told by your doctor. °· If you were prescribed an antibiotic medicine, take it as told by your doctor. Do not stop taking the antibiotic even if you start to feel better. °· If you develop a fever, do not take medicines to lower the fever right away. Tell your doctor about the fever. °Managing pain, stiffness, and swelling °· Try these methods to help with pain: °? Use a heating pad. °? Take a warm bath. °? Distract yourself, such as by watching TV. °Eating and drinking °· Drink enough fluid to keep your pee (urine) clear or pale yellow. Drink more in hot weather and during exercise. °· Limit or avoid alcohol. °· Eat a healthy diet. Eat plenty of fruits, vegetables, whole grains, and lean protein. °· Take vitamins and supplements as told by your doctor. °Traveling °· When traveling, keep these with you: °? Your medical information. °? The names of your doctors. °? Your medicines. °· If you need to take an airplane, talk to your doctor first. °Activity °· Rest often. °· Avoid exercises that make your heart beat much faster, such as jogging. °General instructions °· Do not use products that have nicotine or tobacco, such as cigarettes and e-cigarettes. If you need help quitting, ask your doctor. °· Consider wearing a medical alert bracelet. °· Avoid being in high places (high altitudes), such as mountains. °· Avoid very hot or cold temperatures. °· Avoid places where the  temperature changes a lot. °· Keep all follow-up visits as told by your doctor. This is important. °Contact a doctor if: °· A joint hurts. °· Your feet or hands hurt or swell. °· You feel tired (fatigued). °Get help right away if: °· You have symptoms of infection. These include: °? Fever. °? Chills. °? Being very tired. °? Irritability. °? Poor eating. °? Throwing up (vomiting). °· You feel dizzy or faint. °· You have new stomach pain, especially on the left side. °· You have a an erection (priapism) that lasts more than 4 hours. °· You have numbness in your arms or legs. °· You have a hard time moving your arms or legs. °· You have trouble talking. °· You have pain that does not go away when you take medicine. °· You are short of breath. °· You are breathing fast. °· You have a long-term cough. °· You have pain in your chest. °· You have a bad headache. °· You have a stiff neck. °· Your stomach looks bloated even though you did not eat much. °· Your skin is pale. °· You suddenly cannot see well. °Summary °· Sickle cell anemia is a condition where your red blood cells are shaped like sickles. °· Follow your doctor's advice on ways to manage pain, food to eat, activities to do, and steps to take for safe travel. °· Get medical help right away if you have any signs of infection, such as a fever. °This information is not intended to replace advice given to you by   your health care provider. Make sure you discuss any questions you have with your health care provider. °Document Released: 01/25/2013 Document Revised: 07/29/2018 Document Reviewed: 05/12/2016 °Elsevier Patient Education © 2020 [employer] ° °

## 2019-03-14 NOTE — ED Provider Notes (Signed)
[facility]-EMERGENCY DEPT Provider Note   CSN: [id number] Arrival date & time: 03/13/19  2256     History   Chief Complaint Chief Complaint  Patient presents with  . Sickle Cell Pain Crisis    HPI Martin X Pridgeon is a 28 y.o. male with a hx of Sickle Cell Disease, HIV, DVT/PE, Hemolytic Anemia with Transfusion Hemosiderosis, Anxiety presents to the Emergency Department complaining of gradual, persistent, progressively worsening low back pain and bilateral hippain onset 6pm tonight.  Pt reports taking 2 tabs of percocet  at that time without relief.  Patient reports that pain today is similar to previous episodes of sickle cell crisis.  Record reviewed.  Patient has had 10 admissions in the last year.  Of note, his last admission in early November was for acute chest syndrome and new pulmonary embolism.  He left that admission AMA.  He reports he has been compliant with his medications.  No specific aggravating or alleviating factors.  Patient denies known sick contacts or Covid contacts.  Denies fever, chills, headache, neck pain, chest pain, shortness of breath, domino pain, nausea, vomiting, diarrhea, weakness, dizziness, syncope.       The history is provided by the patient and medical records. No language interpreter was used.    Past Medical History:  Diagnosis Date  . Anxiety   . HIV (human immunodeficiency virus infection) (HCC)   . Sickle cell crisis (HCC)   . 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HCC)   . Vitamin D deficiency 10/2018    Patient Active Problem List   Diagnosis Date Noted  . Acute pulmonary embolism (HCC) 02/10/2019  . Acute chest syndrome due to sickle cell crisis (HCC) 02/10/2019  . Sickle cell crisis (HCC) 01/17/2019  . Heart murmur 02/03/2017  . Sickle cell disease with crisis (HCC) 02/02/2017  . 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 Sickle cell pain crisis (HCC) 02/02/2017  . 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HCC) 12/20/2016  . Vitamin D  deficiency 08/14/2016  . High risk medication use 09/27/2014  . Generalized anxiety disorder 05/19/2014  . GERD (gastroesophageal reflux disease) 05/19/2014  . HIV (human immunodeficiency virus infection) (HCC) 05/23/2012    History reviewed. No pertinent surgical history.      Home Medications    Prior to Admission medications   Medication Sig Start Date End Date Taking? Authorizing Provider  abacavir-dolutegravir-lamiVUDine (TRIUMEQ) 600-50-300 MG tablet Take 1 tablet by mouth daily.   Yes [provider]  ALPRAZolam Prudy Feeler) 1 MG tablet Take 1 tablet (1 mg total) by mouth 2 (two) times daily as needed for anxiety. 02/16/19 03/18/19 Yes [name], [name]  apixaban (ELIQUIS) 5 MG TABS tablet Take 1 tablet (5 mg total) by mouth 2 (two) times daily. 02/16/19  Yes Massie Maroon, [name]  cetirizine (ZYRTEC) 10 MG chewable tablet Chew 10 mg by mouth daily as needed for allergies.    Yes [provider]  diphenhydrAMINE (BENADRYL) 25 MG tablet Take 25 mg by mouth every 6 (six) hours as needed for allergies.   Yes [provider]  hydroxyurea (HYDREA) 500 MG capsule Take 4 capsules (2,000 mg total) by mouth at bedtime. 03/07/19  Yes [name], [name]  Multiple Vitamin (MULTIVITAMIN WITH MINERALS) TABS tablet Take 1 tablet by mouth daily.   Yes [provider]  naloxone [facility]) 0.4 MG/ML injection As needed. Patient taking differently: Inject 0.4 mg into the muscle daily as needed (overdose).  11/28/18  Yes [name], [name]  oxyCODONE-acetaminophen (PERCOCET)  10-325 MG tablet Take 1 tablet by mouth every 4 (four) hours as needed for up to 15 days for pain. 03/07/19 03/22/19 Yes Azzie Glatter, [name]  Vitamin D, Ergocalciferol, (DRISDOL) 1.25 MG (50000 UT) CAPS capsule Take 1 capsule (50,000 Units total) by mouth every 7 (seven) days. Patient not taking: Reported on 02/09/2019 11/01/18   Azzie Glatter, [name]    Family History Family History   Problem Relation Age of Onset  . Sickle cell trait Mother   . Sickle cell trait Father   . Birth defects Maternal Grandmother   . Birth defects Paternal Grandmother     Social History Social History   Tobacco Use  . Smoking status: Never Smoker  . Smokeless tobacco: Never Used  Substance Use Topics  . Alcohol use: No  . Drug use: No     Allergies   Ketorolac   Review of Systems Review of Systems  Constitutional: Negative for appetite change, diaphoresis, fatigue, fever and unexpected weight change.  HENT: Negative for mouth sores.   Eyes: Negative for visual disturbance.  Respiratory: Negative for cough, chest tightness, shortness of breath and wheezing.   Cardiovascular: Negative for chest pain.  Gastrointestinal: Negative for abdominal pain, constipation, diarrhea, nausea and vomiting.  Endocrine: Negative for polydipsia, polyphagia and polyuria.  Genitourinary: Negative for dysuria, frequency, hematuria and urgency.  Musculoskeletal: Positive for arthralgias, back pain and myalgias. Negative for neck stiffness.  Skin: Negative for rash.  Allergic/Immunologic: Negative for immunocompromised state.  Neurological: Negative for syncope, light-headedness and headaches.  Hematological: Does not bruise/bleed easily.  Psychiatric/Behavioral: Negative for sleep disturbance. The patient is not nervous/anxious.      Physical Exam Updated Vital Signs BP 103/76 (BP Location: Left Arm)   Pulse 77   Temp 98.7 F (37.1 C) (Oral)   Resp 17   Ht 6\' 3"  (1.905 m)   Wt 68 kg   SpO2 93%   BMI 18.75 kg/m   Physical Exam Vitals signs and nursing note reviewed.  Constitutional:      General: He is not in acute distress.    Appearance: He is well-developed. He is not diaphoretic.     Comments: Awake, alert, nontoxic appearing, NAD  HENT:     Head: Normocephalic and atraumatic.  Eyes:     General: No scleral icterus.    Conjunctiva/sclera: Conjunctivae normal.  Neck:      Musculoskeletal: Normal range of motion and neck supple.  Cardiovascular:     Rate and Rhythm: Normal rate and regular rhythm.     Pulses:          Radial pulses are 2+ on the right side and 2+ on the left side.       Dorsalis pedis pulses are 2+ on the right side and 2+ on the left side.       Posterior tibial pulses are 2+ on the right side and 2+ on the left side.     Comments: Capillary refill < 3 sec Pulmonary:     Effort: Pulmonary effort is normal. No tachypnea, accessory muscle usage or respiratory distress.     Breath sounds: Normal breath sounds. No decreased breath sounds, wheezing, rhonchi or rales.  Abdominal:     General: Bowel sounds are normal. There is no distension.     Palpations: Abdomen is soft.     Tenderness: There is no abdominal tenderness. There is no guarding.  Musculoskeletal: Normal range of motion.        General:  No tenderness.     Comments: Full ROM of all major joints No erythema, tenderness or swelling of any major joint  Lymphadenopathy:     Cervical: No cervical adenopathy.  Skin:    General: Skin is warm and dry.     Findings: No erythema.  Neurological:     Mental Status: He is alert and oriented to person, place, and time.  Psychiatric:        Behavior: Behavior normal.      ED Treatments / Results  Labs (all labs ordered are listed, but only abnormal results are displayed) Labs Reviewed  COMPREHENSIVE METABOLIC PANEL - Abnormal; Notable for the following components:      Result Value   Total Protein 8.3 (*)    Total Bilirubin 5.5 (*)    All other components within normal limits  CBC WITH DIFFERENTIAL/PLATELET - Abnormal; Notable for the following components:   WBC 12.1 (*)    RBC 2.36 (*)    Hemoglobin 9.3 (*)    HCT 26.4 (*)    MCV 111.9 (*)    MCH 39.4 (*)    RDW 21.8 (*)    nRBC 0.9 (*)    Lymphs Abs 6.3 (*)    Monocytes Absolute 1.4 (*)    All other components within normal limits  RETICULOCYTES - Abnormal; Notable for  the following components:   Retic Ct Pct 26.0 (*)    RBC. 2.36 (*)    Retic Count, Absolute 649.0 (*)    Immature Retic Fract 22.7 (*)    All other components within normal limits     Procedures Procedures (including critical care time)  Medications Ordered in ED Medications  0.45 % sodium chloride infusion ( Intravenous New Bag/Given 03/14/19 0214)  HYDROmorphone (DILAUDID) injection 2 mg (2 mg Intravenous Given 03/14/19 0437)    Or  HYDROmorphone (DILAUDID) injection 2 mg ( Subcutaneous See Alternative 03/14/19 0437)  diphenhydrAMINE (BENADRYL) capsule 25-50 mg (50 mg Oral Given 03/14/19 0212)  promethazine (PHENERGAN) tablet 25 mg (has no administration in time range)  diphenhydrAMINE (BENADRYL) 25 mg capsule (has no administration in time range)  sodium chloride flush (NS) 0.9 % injection 3 mL (3 mLs Intravenous Given 03/14/19 0205)  HYDROmorphone (DILAUDID) injection 2 mg (2 mg Intravenous Given 03/14/19 7858)    Or  HYDROmorphone (DILAUDID) injection 2 mg ( Subcutaneous See Alternative 03/14/19 0213)  HYDROmorphone (DILAUDID) injection 2 mg (2 mg Intravenous Given 03/14/19 0254)    Or  HYDROmorphone (DILAUDID) injection 2 mg ( Subcutaneous See Alternative 03/14/19 0254)  HYDROmorphone (DILAUDID) injection 2 mg (2 mg Intravenous Given 03/14/19 0336)    Or  HYDROmorphone (DILAUDID) injection 2 mg ( Subcutaneous See Alternative 03/14/19 0336)     Initial Impression / Assessment and Plan / ED Course  I have reviewed the triage vital signs and the nursing notes.  Pertinent labs & imaging results that were available during my care of the patient were reviewed by me and considered in my medical decision making (see chart for details).  Clinical Course as of Mar 13 520  Tue Mar 14, 2019  8502 He feels better after 3 doses of Dilaudid.  Believes he can go home with 1 additional dose.   [HM]  0447 Baseline  Hemoglobin(!): 9.3 [HM]  0447 Retic Ct Pct(!): 26.0 [HM]  0448 mild   WBC(!): 12.1 [HM]  0448 Afebrile  Temp: 98.7 F (37.1 C) [HM]    Clinical Course User Index [HM] Sydni Elizarraraz, Dahlia Client, PA-C  Resents with a history of sickle cell disease.  Numerous sickle cell crises in the last year.  He is afebrile today and otherwise well-appearing.  Normal back and hip pain.  No chest pain or shortness of breath.  No hypoxia.  No hypotension.  Patient given 4 doses of medication with significant improvement.  He reports he feels well and wishes to go home.  Labs are generally reassuring and hemoglobin is at baseline.  Discussed home monitoring and reasons to return immediately to the emergency department.  Patient states understanding and is in agreement with the plan.  Final Clinical Impressions(s) / ED Diagnoses   Final diagnoses:  Sickle cell pain crisis [facility](HCC)    ED Discharge Orders    None       Milta DeitersMuthersbaugh, Jaxxen Voong, PA-C 03/14/19 0521    Ward, Layla MawKristen N, DO 03/14/19 [phone]

## 2019-03-14 NOTE — Telephone Encounter (Signed)
Patient called requesting to come to the day hospital for sickle cell pain. Patient reports generalized pain rated 10/10. Reports last taking pain medication at 6:00 am. COVID-19 screening done and negative. Patient denies fever, chest pain, nausea, vomiting, diarrhea, abdominal pain and priapism. Admits to having transportation at discharge without driving self. Dr. Doreene Burke notified. Patient can come to the day hospital for pain management. Patient advised and expresses an understanding.

## 2019-03-14 NOTE — Discharge Instructions (Addendum)
1. Medications: usual home medications 2. Treatment: rest, drink plenty of fluids,  3. Follow Up: Please followup with your primary doctor in 2-3 days for discussion of your diagnoses and further evaluation after today's visit; if you do not have a primary care doctor use the resource guide provided to find one; Please return to the ER for development of chest pain, difficulty breathing, fevers, vomiting, worsening pain or any other concerns.

## 2019-03-20 ENCOUNTER — Other Ambulatory Visit: Payer: Self-pay | Admitting: Family Medicine

## 2019-03-20 ENCOUNTER — Inpatient Hospital Stay: Payer: PRIVATE HEALTH INSURANCE | Admitting: Family Medicine

## 2019-03-20 ENCOUNTER — Telehealth: Payer: Self-pay

## 2019-03-20 DIAGNOSIS — F411 Generalized anxiety disorder: Secondary | ICD-10-CM

## 2019-03-20 DIAGNOSIS — G8929 Other chronic pain: Secondary | ICD-10-CM

## 2019-03-20 DIAGNOSIS — D571 Sickle-cell disease without crisis: Secondary | ICD-10-CM

## 2019-03-20 MED ORDER — OXYCODONE-ACETAMINOPHEN 10-325 MG PO TABS: 1.0000 | ORAL_TABLET | ORAL | 0 refills | Status: DC | PRN

## 2019-03-20 MED ORDER — ALPRAZOLAM 1 MG PO TABS: 1.0000 mg | ORAL_TABLET | Freq: Two times a day (BID) | ORAL | 0 refills | Status: DC | PRN

## 2019-03-20 NOTE — Telephone Encounter (Signed)
Refill request for percocet and xanax. Send to [facility] in [place], Please. Thanks!

## 2019-03-20 NOTE — Telephone Encounter (Signed)
Called and informed that rx's have been sent to pharmacy. Thanks!

## 2019-03-24 ENCOUNTER — Other Ambulatory Visit: Payer: Self-pay

## 2019-03-24 ENCOUNTER — Ambulatory Visit (INDEPENDENT_AMBULATORY_CARE_PROVIDER_SITE_OTHER): Payer: PRIVATE HEALTH INSURANCE | Admitting: Family Medicine

## 2019-03-24 ENCOUNTER — Encounter: Payer: Self-pay | Admitting: Family Medicine

## 2019-03-24 VITALS — BP 112/72 | HR 91 | Temp 98.2°F | Ht 75.0 in | Wt 158.2 lb

## 2019-03-24 DIAGNOSIS — G8929 Other chronic pain: Secondary | ICD-10-CM | POA: Diagnosis not present

## 2019-03-24 DIAGNOSIS — D571 Sickle-cell disease without crisis: Secondary | ICD-10-CM | POA: Diagnosis not present

## 2019-03-24 DIAGNOSIS — Z09 Encounter for follow-up examination after completed treatment for conditions other than malignant neoplasm: Secondary | ICD-10-CM | POA: Diagnosis not present

## 2019-03-24 DIAGNOSIS — F1190 Opioid use, unspecified, uncomplicated: Secondary | ICD-10-CM

## 2019-03-24 DIAGNOSIS — D5700 Hb-SS disease with crisis, unspecified: Secondary | ICD-10-CM

## 2019-03-24 DIAGNOSIS — F411 Generalized anxiety disorder: Secondary | ICD-10-CM

## 2019-03-24 LAB — POCT URINALYSIS DIPSTICK
Bilirubin, UA: NEGATIVE
Blood, UA: NEGATIVE
Glucose, UA: NEGATIVE
Ketones, UA: NEGATIVE
Leukocytes, UA: NEGATIVE
Nitrite, UA: NEGATIVE
Protein, UA: NEGATIVE
Spec Grav, UA: 1.02 (ref 1.010–1.025)
Urobilinogen, UA: 2 E.U./dL — AB
pH, UA: 5.5 (ref 5.0–8.0)

## 2019-03-24 NOTE — Progress Notes (Signed)
[facility]   Hospital Follow Up  Subjective:  Patient ID: Martin [name], male    DOB: 10/17/1990  Age: 28 y.o. MRN: [id number]  CC:  Chief Complaint  Patient presents with  . Hospitalization Follow-up    03/14/2019 Sickle Cell   . Pain    Joint Pain all over    HPI Martin [name] is a 28 year old male who presents for Follow Up today.   Past Medical History:  Diagnosis Date  . Anxiety   . HIV (human immunodeficiency virus infection) (HCC)   . Sickle cell crisis (HCC)   . 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HCC)   . Vitamin D deficiency 10/2018   Current Status: Since his last office visit, he is doing well with no complaints. He states that he has pain in all joints today. He rates his pain today at 6-7/10. He has not had a hospital visit for Sickle Cell Crisis since 03/14/2019 where he was treated and discharged the same day. He is currently taking all medications as prescribed and staying well hydrated. He reports occasional nausea, constipation, dizziness and headaches. He denies fevers, chills, fatigue, recent infections, weight loss, and night sweats. He has not had any visual changes, and falls. No chest pain, heart palpitations, cough and shortness of breath reported. No reports of GI problems such as vomiting, and diarrhea. He has no reports of blood in stools, dysuria and hematuria. No depression or anxiety reported today.   No past surgical history on file.  Family History  Problem Relation Age of Onset  . Sickle cell trait Mother   . Sickle cell trait Father   . Birth defects Maternal Grandmother   . Birth defects Paternal Grandmother     Social History   Socioeconomic History  . Marital status: Single    Spouse name: Not on file  . Number of children: Not on file  . Years of education: Not on file  . Highest education level: Not on file  Occupational History  . Not on file  Social Needs  . Financial  resource strain: Not on file  . Food insecurity    Worry: Not on file    Inability: Not on file  . Transportation needs    Medical: Not on file    Non-medical: Not on file  Tobacco Use  . Smoking status: Never Smoker  . Smokeless tobacco: Never Used  Substance and Sexual Activity  . Alcohol use: No  . Drug use: No  . Sexual activity: Yes    Birth control/protection: Condom  Lifestyle  . Physical activity    Days per week: Not on file    Minutes per session: Not on file  . Stress: Not on file  Relationships  . Social Musicianconnections    Talks on phone: Not on file    Gets together: Not on file    Attends religious service: Not on file    Active member of club or organization: Not on file    Attends meetings of clubs or organizations: Not on file    Relationship status: Not on file  . Intimate partner violence    Fear of current or ex partner: Not on file    Emotionally abused: Not on file    Physically abused: Not on file    Forced sexual activity: Not on file  Other Topics Concern  . Not on file  Social History Narrative  . Not on  file    Outpatient Medications Prior to Visit  Medication Sig Dispense Refill  . abacavir-dolutegravir-lamiVUDine (TRIUMEQ) 600-50-300 MG tablet Take 1 tablet by mouth at bedtime.     . ALPRAZolam (XANAX) 1 MG tablet Take 1 tablet (1 mg total) by mouth 2 (two) times daily as needed for anxiety. 30 tablet 0  . apixaban (ELIQUIS) 5 MG TABS tablet Take 1 tablet (5 mg total) by mouth 2 (two) times daily. 60 tablet 5  . cetirizine (ZYRTEC) 10 MG chewable tablet Chew 10 mg by mouth daily as needed for allergies.     . diphenhydrAMINE (BENADRYL) 25 MG tablet Take 25 mg by mouth every 6 (six) hours as needed for allergies.    . hydroxyurea (HYDREA) 500 MG capsule Take 4 capsules (2,000 mg total) by mouth at bedtime. 120 capsule 3  . Multiple Vitamin (MULTIVITAMIN WITH MINERALS) TABS tablet Take 1 tablet by mouth daily with breakfast.     . naloxone  (NARCAN) 0.4 MG/ML injection As needed. (Patient taking differently: Inject 0.4 mg into the muscle daily as needed (overdose). ) 1 mL 2  . oxyCODONE-acetaminophen (PERCOCET) 10-325 MG tablet Take 1 tablet by mouth every 4 (four) hours as needed for up to 15 days for pain. 90 tablet 0  . Vitamin D, Ergocalciferol, (DRISDOL) 1.25 MG (50000 UT) CAPS capsule Take 1 capsule (50,000 Units total) by mouth every 7 (seven) days. (Patient not taking: Reported on 03/26/2019) 5 capsule 3   No facility-administered medications prior to visit.     Allergies  Allergen Reactions  . Tape Rash and Other (See Comments)    PLEASE DO NOT USE THE CLEAR, THICK, "PLASTIC" TAPE- only paper tape is tolerated   . Ketorolac Swelling and Other (See Comments)    Patient reports facial edema and left arm edema after administration.    ROS Review of Systems  Constitutional: Negative.   HENT: Negative.   Eyes: Negative.   Respiratory: Negative.   Cardiovascular: Negative.   Gastrointestinal: Positive for constipation (occasional ) and nausea (occasional ).  Endocrine: Negative.   Genitourinary: Negative.   Musculoskeletal: Positive for arthralgias (generalized chronic joint pain).  Hematological: Negative.   Psychiatric/Behavioral: Negative.    Objective:    Physical Exam  Constitutional: He is oriented to person, place, and time. He appears well-developed and well-nourished.  HENT:  Head: Normocephalic and atraumatic.  Eyes: Conjunctivae are normal.  Neck: Normal range of motion. Neck supple.  Pulmonary/Chest: Effort normal and breath sounds normal.  Abdominal: Soft. Bowel sounds are normal.  Musculoskeletal: Normal range of motion.  Neurological: He is alert and oriented to person, place, and time. He has normal reflexes.  Skin: Skin is warm and dry.  Psychiatric: He has a normal mood and affect. His behavior is normal. Judgment and thought content normal.  Nursing note and vitals reviewed.   BP 112/72    Pulse 91   Temp 98.2 F (36.8 C)   Ht 6\' 3"  (1.905 m)   Wt 158 lb 3.2 oz (71.8 kg)   SpO2 93%   BMI 19.77 kg/m  Wt Readings from Last 3 Encounters:  03/26/19 165 lb 5.5 oz (75 kg)  03/24/19 158 lb 3.2 oz (71.8 kg)  03/14/19 150 lb (68 kg)     Health Maintenance Due  Topic Date Due  . INFLUENZA VACCINE  11/19/2018    There are no preventive care reminders to display for this patient.  No results found for: TSH Lab Results  Component Value  Date   WBC 9.0 03/26/2019   HGB 8.8 (L) 03/26/2019   HCT 23.9 (L) 03/26/2019   MCV 110.6 (H) 03/26/2019   PLT 220 03/26/2019   Lab Results  Component Value Date   NA 139 03/26/2019   K 4.4 03/26/2019   CO2 25 03/26/2019   GLUCOSE 95 03/26/2019   BUN 6 03/26/2019   CREATININE 0.75 03/26/2019   BILITOT 8.8 (H) 03/26/2019   ALKPHOS 58 03/26/2019   AST 39 03/26/2019   ALT 30 03/26/2019   PROT 7.7 03/26/2019   ALBUMIN 3.7 03/26/2019   CALCIUM 8.8 (L) 03/26/2019   ANIONGAP 7 03/26/2019   No results found for: CHOL No results found for: HDL No results found for: LDLCALC No results found for: [name] No results found for: CHOLHDL Lab Results  Component Value Date   HGBA1C (L) 05/22/2009    <4.0 (NOTE) The ADA recommends the following therapeutic goal for glycemic control related to Hgb A1c measurement: Goal of therapy: <6.5 Hgb A1c  Reference: American Diabetes Association: Clinical Practice Recommendations 2010, Diabetes Care, 2010, 33: (Suppl  1).      Assessment & Plan:   1. Hospital discharge follow-up He reports moderate joint point today. He will continue to take pain medications as prescribed; will continue to avoid extreme heat and cold; will continue to eat a healthy diet and drink at least 64 ounces of water daily; continue stool softener as needed; will avoid colds and flu; will continue to get plenty of sleep and rest; will continue to avoid high stressful situations and remain infection free; will continue Folic  Acid 1 mg daily to avoid sickle cell crisis. Continue to follow up with Hematologist as needed.   2. Sickle cell crisis (HCC) - Urinalysis Dipstick  3. Hb-SS disease without crisis ([place])  4. Other chronic pain  5. Generalized anxiety disorder Mild today.   6. Chronic, continuous use of opioids  7. Follow up He will follow up in 2 months.     No orders of the defined types were placed in this encounter.   Orders Placed This Encounter  Procedures  . Urinalysis Dipstick    Referral Orders  No referral(s) requested today    Kathe Becton,  [name], [name]-BC [place], [place] [address] [phone]- fax  Problem List Items Addressed This Visit      Other   Generalized anxiety disorder   Sickle cell crisis ([place])   Relevant Orders   Urinalysis Dipstick (Completed)   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[place])    Other Visit Diagnoses    Hospital discharge follow-up    -  Primary   Other chronic pain       Chronic, continuous use of opioids       Follow up          No orders of the defined types were placed in this encounter.   Follow-up: No follow-ups on file.    Azzie Glatter, [name]

## 2019-03-26 ENCOUNTER — Encounter (HOSPITAL_COMMUNITY): Payer: Self-pay | Admitting: Emergency Medicine

## 2019-03-26 ENCOUNTER — Emergency Department (HOSPITAL_COMMUNITY)
Admission: EM | Admit: 2019-03-26 | Discharge: 2019-03-26 | Disposition: A | Discharge status: Home / Self Care | Payer: Medicaid - Out of State | Attending: Emergency Medicine | Admitting: Emergency Medicine

## 2019-03-26 ENCOUNTER — Other Ambulatory Visit: Payer: Self-pay

## 2019-03-26 DIAGNOSIS — D5700 Hb-SS disease with crisis, unspecified: Secondary | ICD-10-CM | POA: Diagnosis not present

## 2019-03-26 DIAGNOSIS — M545 Low back pain: Secondary | ICD-10-CM | POA: Diagnosis present

## 2019-03-26 DIAGNOSIS — Z21 Asymptomatic human immunodeficiency virus [HIV] infection status: Secondary | ICD-10-CM | POA: Diagnosis not present

## 2019-03-26 DIAGNOSIS — Z7901 Long term (current) use of anticoagulants: Secondary | ICD-10-CM | POA: Insufficient documentation

## 2019-03-26 DIAGNOSIS — Z79899 Other long term (current) drug therapy: Secondary | ICD-10-CM | POA: Insufficient documentation

## 2019-03-26 LAB — URINALYSIS, ROUTINE W REFLEX MICROSCOPIC
Bacteria, UA: NONE SEEN
Bilirubin Urine: NEGATIVE
Glucose, UA: NEGATIVE mg/dL
Ketones, ur: NEGATIVE mg/dL
Leukocytes,Ua: NEGATIVE
Nitrite: NEGATIVE
Protein, ur: NEGATIVE mg/dL
Specific Gravity, Urine: 1.014 (ref 1.005–1.030)
pH: 6 (ref 5.0–8.0)

## 2019-03-26 LAB — CBC WITH DIFFERENTIAL/PLATELET
Abs Immature Granulocytes: 0 10*3/uL (ref 0.00–0.07)
Basophils Absolute: 0 10*3/uL (ref 0.0–0.1)
Basophils Relative: 0 %
Eosinophils Absolute: 0.4 10*3/uL (ref 0.0–0.5)
Eosinophils Relative: 4 %
HCT: 23.9 % — ABNORMAL LOW (ref 39.0–52.0)
Hemoglobin: 8.8 g/dL — ABNORMAL LOW (ref 13.0–17.0)
Lymphocytes Relative: 34 %
Lymphs Abs: 3.1 10*3/uL (ref 0.7–4.0)
MCH: 40.7 pg — ABNORMAL HIGH (ref 26.0–34.0)
MCHC: 36.8 g/dL — ABNORMAL HIGH (ref 30.0–36.0)
MCV: 110.6 fL — ABNORMAL HIGH (ref 80.0–100.0)
Monocytes Absolute: 0.5 10*3/uL (ref 0.1–1.0)
Monocytes Relative: 6 %
Neutro Abs: 5 10*3/uL (ref 1.7–7.7)
Neutrophils Relative %: 56 %
Platelets: 220 10*3/uL (ref 150–400)
RBC: 2.16 MIL/uL — ABNORMAL LOW (ref 4.22–5.81)
RDW: 24.2 % — ABNORMAL HIGH (ref 11.5–15.5)
WBC: 9 10*3/uL (ref 4.0–10.5)
nRBC: 19.6 % — ABNORMAL HIGH (ref 0.0–0.2)
nRBC: 39 /100 WBC — ABNORMAL HIGH

## 2019-03-26 LAB — COMPREHENSIVE METABOLIC PANEL
ALT: 30 U/L (ref 0–44)
AST: 39 U/L (ref 15–41)
Albumin: 3.7 g/dL (ref 3.5–5.0)
Alkaline Phosphatase: 58 U/L (ref 38–126)
Anion gap: 7 (ref 5–15)
BUN: 6 mg/dL (ref 6–20)
CO2: 25 mmol/L (ref 22–32)
Calcium: 8.8 mg/dL — ABNORMAL LOW (ref 8.9–10.3)
Chloride: 107 mmol/L (ref 98–111)
Creatinine, Ser: 0.75 mg/dL (ref 0.61–1.24)
GFR calc Af Amer: >60 mL/min (ref >60)
GFR calc non Af Amer: >60 mL/min (ref >60)
Glucose, Bld: 95 mg/dL (ref 70–99)
Potassium: 4.4 mmol/L (ref 3.5–5.1)
Sodium: 139 mmol/L (ref 135–145)
Total Bilirubin: 8.8 mg/dL — ABNORMAL HIGH (ref 0.3–1.2)
Total Protein: 7.7 g/dL (ref 6.5–8.1)

## 2019-03-26 LAB — RETICULOCYTES
Immature Retic Fract: 23.1 % — ABNORMAL HIGH (ref 2.3–15.9)
RBC.: 2.16 MIL/uL — ABNORMAL LOW (ref 4.22–5.81)
Retic Count, Absolute: 489.7 10*3/uL — ABNORMAL HIGH (ref 19.0–186.0)
Retic Ct Pct: 22.7 % — ABNORMAL HIGH (ref 0.4–3.1)

## 2019-03-26 MED ORDER — OXYCODONE-ACETAMINOPHEN 5-325 MG PO TABS
1.0000 | ORAL_TABLET | Freq: Once | ORAL | Status: AC
  Administered 2019-03-26: 1 via ORAL
  Filled 2019-03-26: qty 1

## 2019-03-26 MED ORDER — HYDROMORPHONE HCL 1 MG/ML IJ SOLN
2.0000 mg | INTRAMUSCULAR | Status: AC
  Administered 2019-03-26: 2 mg via INTRAVENOUS

## 2019-03-26 MED ORDER — HYDROMORPHONE HCL 1 MG/ML IJ SOLN: 2.0000 mg | INTRAMUSCULAR | Status: AC

## 2019-03-26 MED ORDER — CHLORHEXIDINE GLUCONATE CLOTH 2 % EX PADS: 6.0000 | MEDICATED_PAD | Freq: Every day | CUTANEOUS | Status: DC

## 2019-03-26 MED ORDER — SODIUM CHLORIDE 0.9% FLUSH: 3.0000 mL | Freq: Once | INTRAVENOUS | Status: DC

## 2019-03-26 MED ORDER — DIPHENHYDRAMINE HCL 50 MG/ML IJ SOLN
25.0000 mg | INTRAMUSCULAR | Status: DC | PRN
  Administered 2019-03-26: 25 mg via INTRAVENOUS
  Filled 2019-03-26: qty 1

## 2019-03-26 MED ORDER — DIPHENHYDRAMINE HCL 50 MG/ML IJ SOLN
25.0000 mg | Freq: Once | INTRAMUSCULAR | Status: AC
  Administered 2019-03-26: 25 mg via INTRAVENOUS
  Filled 2019-03-26: qty 1

## 2019-03-26 MED ORDER — PROMETHAZINE HCL 25 MG/ML IJ SOLN: 25.0000 mg | Freq: Four times a day (QID) | INTRAMUSCULAR | Status: DC | PRN

## 2019-03-26 MED ORDER — HEPARIN SOD (PORK) LOCK FLUSH 100 UNIT/ML IV SOLN
500.0000 [IU] | INTRAVENOUS | Status: AC | PRN
  Administered 2019-03-26: 500 [IU]
  Filled 2019-03-26: qty 5

## 2019-03-26 MED ORDER — SODIUM CHLORIDE 0.9% FLUSH: 10.0000 mL | Freq: Two times a day (BID) | INTRAVENOUS | Status: DC

## 2019-03-26 MED ORDER — HYDROMORPHONE HCL 1 MG/ML IJ SOLN
2.0000 mg | INTRAMUSCULAR | Status: AC
  Administered 2019-03-26: 2 mg via INTRAVENOUS
  Filled 2019-03-26: qty 2

## 2019-03-26 MED ORDER — HYDROMORPHONE HCL 1 MG/ML IJ SOLN
2.0000 mg | Freq: Once | INTRAMUSCULAR | Status: AC
  Administered 2019-03-26: 2 mg via INTRAVENOUS
  Filled 2019-03-26: qty 2

## 2019-03-26 MED ORDER — SODIUM CHLORIDE 0.9% FLUSH
10.0000 mL | INTRAVENOUS | Status: DC | PRN
  Administered 2019-03-26: 10 mL
  Filled 2019-03-26: qty 40

## 2019-03-26 MED ORDER — HYDROMORPHONE HCL 1 MG/ML IJ SOLN
2.0000 mg | INTRAMUSCULAR | Status: AC
  Filled 2019-03-26: qty 2

## 2019-03-26 NOTE — ED Notes (Signed)
Delay in accessing port r/t RN required to request supplies from materials management explain to pt.. RN offered pt. Subcutaneous dilaudid for pain management while waiting for resources. Pt. Refused states he would rather wait for port access.   Comfort measures: warm blanket, food, and drink provided for pt.

## 2019-03-26 NOTE — Discharge Instructions (Addendum)
Please call and discuss your ED visit with your sickle cell medical provider.  Your provider may wish to make adjustments to your medicine regimen.  You return to the ER if you develop chest pain, shortness of breath, fevers, or any other emergent condition.

## 2019-03-26 NOTE — ED Provider Notes (Signed)
[facility] Provider Note   CSN: [id number] Arrival date & time: 03/26/19  0114    History   Chief Complaint Chief Complaint  Patient presents with  . Sickle Cell Pain Crisis    HPI Martin [name] is a 28 y.o. male.   The history is provided by the patient.  Sickle Cell Pain Crisis He has history of sickle cell disease, HIV disease and comes in with pain in his lower back typical of his sickle cell crises.  Pain started yesterday afternoon and has been getting worse.  He currently rates it at 8/10.  He has taken oxycodone-acetaminophen for pain without relief.  He denies fever, chills, sweats.  He denies chest pain or cough or dyspnea.  He denies nausea, vomiting, diarrhea.  He denies exposure to COVID-19.  Past Medical History:  Diagnosis Date  . Anxiety   . HIV (human immunodeficiency virus infection) (HCC)   . Sickle cell crisis (HCC)   . 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HCC)   . Vitamin D deficiency 10/2018    Patient Active Problem List   Diagnosis Date Noted  . 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 Acute pulmonary embolism (HCC) 02/10/2019  . Acute chest syndrome due to sickle cell crisis (HCC) 02/10/2019  . Sickle cell crisis (HCC) 01/17/2019  . Heart murmur 02/03/2017  . Sickle cell disease with crisis (HCC) 02/02/2017  . 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 Sickle cell pain crisis (HCC) 02/02/2017  . 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HCC) 12/20/2016  . Vitamin D deficiency 08/14/2016  . High risk medication use 09/27/2014  . Generalized anxiety disorder 05/19/2014  . GERD (gastroesophageal reflux disease) 05/19/2014  . HIV (human immunodeficiency virus infection) (HCC) 05/23/2012    History reviewed. No pertinent surgical history.      Home Medications    Prior to Admission medications   Medication Sig Start Date End Date Taking? Authorizing Provider  Cholecalciferol (VITAMIN D-3) 25 MCG (1000 UT) CAPS Take 1,000  Units by mouth 2 (two) times daily. 05/14/18  Yes [provider]  abacavir-dolutegravir-lamiVUDine (TRIUMEQ) 600-50-300 MG tablet Take 1 tablet by mouth daily.    [provider]  ALPRAZolam Prudy Feeler(XANAX) 1 MG tablet Take 1 tablet (1 mg total) by mouth 2 (two) times daily as needed for anxiety. 03/20/19 04/19/19  Kallie LocksStroud, Natalie M, [name]  apixaban (ELIQUIS) 5 MG TABS tablet Take 1 tablet (5 mg total) by mouth 2 (two) times daily. 02/16/19   Massie MaroonHollis, Lachina M, [name]  azithromycin (ZITHROMAX) 250 MG tablet Take 250-500 mg by mouth See admin instructions. Take 500 mg by mouth once daily on day one, then 250 mg once daily on days 2-5    [provider]  cetirizine (ZYRTEC) 10 MG chewable tablet Chew 10 mg by mouth daily as needed for allergies.     [provider]  diphenhydrAMINE (BENADRYL) 25 MG tablet Take 25 mg by mouth every 6 (six) hours as needed for allergies.    [provider]  hydroxyurea (HYDREA) 500 MG capsule Take 4 capsules (2,000 mg total) by mouth at bedtime. 03/07/19   Kallie LocksStroud, Natalie M, [name]  Multiple Vitamin (MULTIVITAMIN WITH MINERALS) TABS tablet Take 1 tablet by mouth daily.    [provider]  naloxone [facility](NARCAN) 0.4 MG/ML injection As needed. Patient taking differently: Inject 0.4 mg into the muscle daily as needed (overdose).  11/28/18   Kallie LocksStroud, Natalie M, [name]  oxyCODONE-acetaminophen (PERCOCET) 10-325 MG tablet Take 1 tablet by mouth  every 4 (four) hours as needed for up to 15 days for pain. 03/21/19 04/05/19  Azzie Glatter, [name]  predniSONE (DELTASONE) 20 MG tablet Take 20 mg by mouth See admin instructions.    [provider]  Vitamin D, Ergocalciferol, (DRISDOL) 1.25 MG (50000 UT) CAPS capsule Take 1 capsule (50,000 Units total) by mouth every 7 (seven) days. Patient not taking: Reported on 02/09/2019 11/01/18   Azzie Glatter, [name]    Family History Family History  Problem Relation Age of Onset  . Sickle cell trait  Mother   . Sickle cell trait Father   . Birth defects Maternal Grandmother   . Birth defects Paternal Grandmother     Social History Social History   Tobacco Use  . Smoking status: Never Smoker  . Smokeless tobacco: Never Used  Substance Use Topics  . Alcohol use: No  . Drug use: No     Allergies   Ketorolac and Tape   Review of Systems Review of Systems  All other systems reviewed and are negative.    Physical Exam Updated Vital Signs BP (!) 98/59   Pulse 78   Temp 99 F (37.2 C) (Oral)   Resp 20   Ht 6\' 3"  (1.905 m)   Wt 75 kg   SpO2 92%   BMI 20.67 kg/m   Physical Exam Vitals signs and nursing note reviewed.    28 year old male, resting comfortably and in no acute distress. Vital signs are normal. Oxygen saturation is 92%, which is normal. Head is normocephalic and atraumatic. PERRLA, EOMI. Oropharynx is clear. Neck is nontender and supple without adenopathy or JVD. Back is nontender and there is no CVA tenderness. Lungs are clear without rales, wheezes, or rhonchi. Chest is nontender.  Mediport present in the right anterior chest wall. Heart has regular rate and rhythm without murmur. Abdomen is soft, flat, nontender without masses or hepatosplenomegaly and peristalsis is normoactive. Extremities have no cyanosis or edema, full range of motion is present. Skin is warm and dry without rash. Neurologic: Mental status is normal, cranial nerves are intact, there are no motor or sensory deficits.  ED Treatments / Results  Labs (all labs ordered are listed, but only abnormal results are displayed) Labs Reviewed  URINALYSIS, ROUTINE W REFLEX MICROSCOPIC - Abnormal; Notable for the following components:      Result Value   Color, Urine AMBER (*)    Hgb urine dipstick SMALL (*)    All other components within normal limits  COMPREHENSIVE METABOLIC PANEL  CBC WITH DIFFERENTIAL/PLATELET  RETICULOCYTES   Procedures Procedures  Medications Ordered in ED  Medications  sodium chloride flush (NS) 0.9 % injection 3 mL (3 mLs Intravenous Not Given 03/26/19 0445)  oxyCODONE-acetaminophen (PERCOCET/ROXICET) 5-325 MG per tablet 1 tablet (1 tablet Oral Given 03/26/19 0127)     Initial Impression / Assessment and Plan / ED Course  I have reviewed the triage vital signs and the nursing notes.  Pertinent lab results that were available during my care of the patient were reviewed by me and considered in my medical decision making (see chart for details).  Sickle cell crisis.  No evidence of acute chest syndrome.  No evidence of infectious trigger.  He is started on sickle cell protocol.  Old records are reviewed, and he has multiple ED visits and hospitalizations for sickle cell disease.  Most recent hospitalization was on 03/14/2019.  7:10 AM Urinalysis is negative.  At this point, there has been difficulty  accessing his port so he has not received any of his medications or have blood drawn.  Case is signed out to Dr. Renaye Rakers  Final Clinical Impressions(s) / ED Diagnoses   Final diagnoses:  Sickle cell pain crisis [facility])    ED Discharge Orders    None       Dione Booze, MD 03/26/19 0710

## 2019-03-26 NOTE — ED Notes (Signed)
Pt verbalized that the only way that we can obtain blood will be accessing his port.

## 2019-03-26 NOTE — ED Triage Notes (Signed)
Patient reports sickle cell pain flare up this evening with low back pain and bilateral hip pain , no fever or chills .

## 2019-04-09 MED ORDER — BENZONATATE 100 MG PO CAPS
100.00 | ORAL_CAPSULE | ORAL | Status: DC
Start: ? — End: 2019-04-09

## 2019-04-09 MED ORDER — GENERIC EXTERNAL MEDICATION
Status: DC
Start: ? — End: 2019-04-09

## 2019-04-09 MED ORDER — ABACAVIR-DOLUTEGRAVIR-LAMIVUD 600-50-300 MG PO TABS
1.00 | ORAL_TABLET | ORAL | Status: DC
Start: 2019-04-09 — End: 2019-04-09

## 2019-04-09 MED ORDER — GENERIC EXTERNAL MEDICATION
2.00 | Status: DC
Start: 2019-04-09 — End: 2019-04-09

## 2019-04-09 MED ORDER — BISACODYL 10 MG RE SUPP
10.00 | RECTAL | Status: DC
Start: ? — End: 2019-04-09

## 2019-04-09 MED ORDER — POLYETHYLENE GLYCOL 3350 17 GM/SCOOP PO POWD
17.00 | ORAL | Status: DC
Start: 2019-04-09 — End: 2019-04-09

## 2019-04-09 MED ORDER — ONDANSETRON HCL 4 MG/2ML IJ SOLN
4.00 | INTRAMUSCULAR | Status: DC
Start: ? — End: 2019-04-09

## 2019-04-09 MED ORDER — GUAIFENESIN 100 MG/5ML PO SYRP
10.00 | ORAL_SOLUTION | ORAL | Status: DC
Start: ? — End: 2019-04-09

## 2019-04-09 MED ORDER — SODIUM CHLORIDE 0.9 % IV SOLN
100.00 | INTRAVENOUS | Status: DC
Start: ? — End: 2019-04-09

## 2019-04-09 MED ORDER — GENERIC EXTERNAL MEDICATION
1.00 | Status: DC
Start: 2019-04-09 — End: 2019-04-09

## 2019-04-09 MED ORDER — CHOLECALCIFEROL 25 MCG (1000 UT) PO TABS
2000.00 | ORAL_TABLET | ORAL | Status: DC
Start: 2019-04-10 — End: 2019-04-09

## 2019-04-09 MED ORDER — FOLIC ACID 1 MG PO TABS
2.00 | ORAL_TABLET | ORAL | Status: DC
Start: 2019-04-10 — End: 2019-04-09

## 2019-04-09 MED ORDER — PROCHLORPERAZINE EDISYLATE 10 MG/2ML IJ SOLN
5.00 | INTRAMUSCULAR | Status: DC
Start: ? — End: 2019-04-09

## 2019-04-09 MED ORDER — CETIRIZINE HCL 10 MG PO TABS
10.00 | ORAL_TABLET | ORAL | Status: DC
Start: 2019-04-10 — End: 2019-04-09

## 2019-04-09 MED ORDER — ALPRAZOLAM 0.5 MG PO TABS
1.00 | ORAL_TABLET | ORAL | Status: DC
Start: 2019-04-09 — End: 2019-04-09

## 2019-04-09 MED ORDER — ENOXAPARIN SODIUM 40 MG/0.4ML ~~LOC~~ SOLN
40.00 | SUBCUTANEOUS | Status: DC
Start: 2019-04-10 — End: 2019-04-09

## 2019-04-09 MED ORDER — HYDROXYUREA 500 MG PO CAPS
2000.00 | ORAL_CAPSULE | ORAL | Status: DC
Start: 2019-04-09 — End: 2019-04-09

## 2019-04-09 MED ORDER — DIPHENHYDRAMINE HCL 50 MG/ML IJ SOLN
25.00 | INTRAMUSCULAR | Status: DC
Start: ? — End: 2019-04-09

## 2019-04-16 ENCOUNTER — Other Ambulatory Visit: Payer: Self-pay | Admitting: Family Medicine

## 2019-04-16 DIAGNOSIS — G8929 Other chronic pain: Secondary | ICD-10-CM

## 2019-04-16 DIAGNOSIS — D571 Sickle-cell disease without crisis: Secondary | ICD-10-CM

## 2019-04-19 ENCOUNTER — Other Ambulatory Visit: Payer: Self-pay | Admitting: Family Medicine

## 2019-04-19 DIAGNOSIS — G8929 Other chronic pain: Secondary | ICD-10-CM

## 2019-04-19 DIAGNOSIS — D571 Sickle-cell disease without crisis: Secondary | ICD-10-CM

## 2019-04-19 MED ORDER — OXYCODONE-ACETAMINOPHEN 10-325 MG PO TABS: 1.0000 | ORAL_TABLET | ORAL | 0 refills | Status: DC | PRN

## 2019-04-23 ENCOUNTER — Emergency Department (HOSPITAL_COMMUNITY): Payer: Medicaid - Out of State

## 2019-04-23 ENCOUNTER — Emergency Department (HOSPITAL_COMMUNITY)
Admission: EM | Admit: 2019-04-23 | Discharge: 2019-04-23 | Disposition: A | Discharge status: Home / Self Care | Payer: Medicaid - Out of State | Source: Home / Self Care | Attending: Emergency Medicine | Admitting: Emergency Medicine

## 2019-04-23 ENCOUNTER — Encounter (HOSPITAL_COMMUNITY): Payer: Self-pay

## 2019-04-23 ENCOUNTER — Other Ambulatory Visit: Payer: Self-pay

## 2019-04-23 ENCOUNTER — Emergency Department (HOSPITAL_COMMUNITY)
Admission: EM | Admit: 2019-04-23 | Discharge: 2019-04-23 | Disposition: A | Discharge status: Home / Self Care | Payer: Medicaid - Out of State | Attending: Emergency Medicine | Admitting: Emergency Medicine

## 2019-04-23 DIAGNOSIS — B20 Human immunodeficiency virus [HIV] disease: Secondary | ICD-10-CM | POA: Insufficient documentation

## 2019-04-23 DIAGNOSIS — Z5321 Procedure and treatment not carried out due to patient leaving prior to being seen by health care provider: Secondary | ICD-10-CM | POA: Insufficient documentation

## 2019-04-23 DIAGNOSIS — D5700 Hb-SS disease with crisis, unspecified: Secondary | ICD-10-CM

## 2019-04-23 DIAGNOSIS — Z79899 Other long term (current) drug therapy: Secondary | ICD-10-CM | POA: Insufficient documentation

## 2019-04-23 DIAGNOSIS — M7918 Myalgia, other site: Secondary | ICD-10-CM | POA: Diagnosis not present

## 2019-04-23 LAB — COMPREHENSIVE METABOLIC PANEL
ALT: 22 U/L (ref 0–44)
AST: 32 U/L (ref 15–41)
Albumin: 4 g/dL (ref 3.5–5.0)
Alkaline Phosphatase: 65 U/L (ref 38–126)
Anion gap: 9 (ref 5–15)
BUN: 10 mg/dL (ref 6–20)
CO2: 21 mmol/L — ABNORMAL LOW (ref 22–32)
Calcium: 9 mg/dL (ref 8.9–10.3)
Chloride: 109 mmol/L (ref 98–111)
Creatinine, Ser: 0.81 mg/dL (ref 0.61–1.24)
GFR calc Af Amer: >60 mL/min (ref >60)
GFR calc non Af Amer: >60 mL/min (ref >60)
Glucose, Bld: 102 mg/dL — ABNORMAL HIGH (ref 70–99)
Potassium: 4.1 mmol/L (ref 3.5–5.1)
Sodium: 139 mmol/L (ref 135–145)
Total Bilirubin: 8 mg/dL — ABNORMAL HIGH (ref 0.3–1.2)
Total Protein: 8.1 g/dL (ref 6.5–8.1)

## 2019-04-23 LAB — CBC WITH DIFFERENTIAL/PLATELET
Abs Immature Granulocytes: 0.05 10*3/uL (ref 0.00–0.07)
Abs Immature Granulocytes: 0 10*3/uL (ref 0.00–0.07)
Basophils Absolute: 0 10*3/uL (ref 0.0–0.1)
Basophils Absolute: 0.1 10*3/uL (ref 0.0–0.1)
Basophils Relative: 0 %
Basophils Relative: 1 %
Eosinophils Absolute: 0.1 10*3/uL (ref 0.0–0.5)
Eosinophils Absolute: 0 10*3/uL (ref 0.0–0.5)
Eosinophils Relative: 1 %
Eosinophils Relative: 0 %
HCT: 24.3 % — ABNORMAL LOW (ref 39.0–52.0)
HCT: 24.1 % — ABNORMAL LOW (ref 39.0–52.0)
Hemoglobin: 8.7 g/dL — ABNORMAL LOW (ref 13.0–17.0)
Hemoglobin: 8.5 g/dL — ABNORMAL LOW (ref 13.0–17.0)
Immature Granulocytes: 1 %
Lymphocytes Relative: 28 %
Lymphocytes Relative: 36 %
Lymphs Abs: 2.7 10*3/uL (ref 0.7–4.0)
Lymphs Abs: 3.5 10*3/uL (ref 0.7–4.0)
MCH: 37.7 pg — ABNORMAL HIGH (ref 26.0–34.0)
MCH: 37.3 pg — ABNORMAL HIGH (ref 26.0–34.0)
MCHC: 35.8 g/dL (ref 30.0–36.0)
MCHC: 35.3 g/dL (ref 30.0–36.0)
MCV: 105.2 fL — ABNORMAL HIGH (ref 80.0–100.0)
MCV: 105.7 fL — ABNORMAL HIGH (ref 80.0–100.0)
Monocytes Absolute: 1.8 10*3/uL — ABNORMAL HIGH (ref 0.1–1.0)
Monocytes Absolute: 1.3 10*3/uL — ABNORMAL HIGH (ref 0.1–1.0)
Monocytes Relative: 18 %
Monocytes Relative: 13 %
Neutro Abs: 5.1 10*3/uL (ref 1.7–7.7)
Neutro Abs: 4.9 10*3/uL (ref 1.7–7.7)
Neutrophils Relative %: 52 %
Neutrophils Relative %: 50 %
Platelets: 333 10*3/uL (ref 150–400)
Platelets: 330 10*3/uL (ref 150–400)
RBC: 2.31 MIL/uL — ABNORMAL LOW (ref 4.22–5.81)
RBC: 2.28 MIL/uL — ABNORMAL LOW (ref 4.22–5.81)
RDW: 23 % — ABNORMAL HIGH (ref 11.5–15.5)
RDW: 21.6 % — ABNORMAL HIGH (ref 11.5–15.5)
WBC: 9.8 10*3/uL (ref 4.0–10.5)
WBC: 9.7 10*3/uL (ref 4.0–10.5)
nRBC: 9.6 % — ABNORMAL HIGH (ref 0.0–0.2)
nRBC: 15 /100 WBC — ABNORMAL HIGH
nRBC: 8.8 % — ABNORMAL HIGH (ref 0.0–0.2)

## 2019-04-23 LAB — RETICULOCYTES
Immature Retic Fract: 26.7 % — ABNORMAL HIGH (ref 2.3–15.9)
RBC.: 2.31 MIL/uL — ABNORMAL LOW (ref 4.22–5.81)
RBC.: 2.28 MIL/uL — ABNORMAL LOW (ref 4.22–5.81)
Retic Count, Absolute: 428.4 10*3/uL — ABNORMAL HIGH (ref 19.0–186.0)
Retic Ct Pct: 18.8 % — ABNORMAL HIGH (ref 0.4–3.1)

## 2019-04-23 LAB — TROPONIN I (HIGH SENSITIVITY)
Troponin I (High Sensitivity): 4 ng/L (ref <18)
Troponin I (High Sensitivity): 8 ng/L (ref <18)

## 2019-04-23 MED ORDER — SODIUM CHLORIDE 0.9% FLUSH: 3.0000 mL | Freq: Once | INTRAVENOUS | Status: DC

## 2019-04-23 MED ORDER — HYDROMORPHONE HCL 1 MG/ML IJ SOLN: 2.0000 mg | INTRAMUSCULAR | Status: AC

## 2019-04-23 MED ORDER — SODIUM CHLORIDE 0.9% FLUSH
3.0000 mL | Freq: Once | INTRAVENOUS | Status: AC
  Administered 2019-04-23: 3 mL via INTRAVENOUS

## 2019-04-23 MED ORDER — HYDROMORPHONE HCL 1 MG/ML IJ SOLN
0.5000 mg | Freq: Once | INTRAMUSCULAR | Status: AC
  Administered 2019-04-23: 0.5 mg via SUBCUTANEOUS
  Filled 2019-04-23: qty 1

## 2019-04-23 MED ORDER — ONDANSETRON HCL 4 MG/2ML IJ SOLN: 4.0000 mg | INTRAMUSCULAR | Status: DC | PRN

## 2019-04-23 MED ORDER — HYDROMORPHONE HCL 1 MG/ML IJ SOLN
2.0000 mg | INTRAMUSCULAR | Status: AC
  Administered 2019-04-23: 2 mg via INTRAVENOUS
  Filled 2019-04-23: qty 2

## 2019-04-23 MED ORDER — DIPHENHYDRAMINE HCL 25 MG PO CAPS
25.0000 mg | ORAL_CAPSULE | Freq: Once | ORAL | Status: AC
  Administered 2019-04-23: 25 mg via ORAL
  Filled 2019-04-23: qty 1

## 2019-04-23 MED ORDER — DIPHENHYDRAMINE HCL 50 MG/ML IJ SOLN
25.0000 mg | Freq: Once | INTRAMUSCULAR | Status: AC
  Administered 2019-04-23: 25 mg via INTRAVENOUS
  Filled 2019-04-23: qty 1

## 2019-04-23 NOTE — ED Notes (Signed)
ED Provider at bedside. 

## 2019-04-23 NOTE — ED Triage Notes (Signed)
Pt reports generalized sickle cell pain. Denies SOB or chest pain. A&Ox4. Denies vomiting.

## 2019-04-23 NOTE — ED Provider Notes (Signed)
[facility] EMERGENCY DEPARTMENT Provider Note   CSN: [id number] Arrival date & time: 04/23/19  0549     History Chief Complaint  Patient presents with  . Sickle Cell Pain Crisis    Martin [name] is a 29 y.o. male with past medical history significant for sickle cell disease, HIV on antiretroviral therapy, DVT/PE, hemolytic anemia with transfusion hemosiderosis, anxiety presents to emergency department today with chief complaint of back pain and chest pain.  Onset was acute starting approximately 12 hours ago.  Patient states he took his home pain medication including Percocet 10 mg without symptom improvement.  He states this back pain feels like his typical sickle cell pain.  He is describing the chest pain is located on left side of his chest.  He states the pain is been constant and is a dull aching sensation. He rates his pain 9/10 in severity. Pain does not radiate.  He states this does not feel similar to when he had acute chest syndrome.   He denies fever, chills, cough, headache, neck pain, pleuritic pain, dyspnea, nausea, vomiting, diarrhea, abdominal pain, lower extremity edema, weakness, syncope, sick contacts or covid-19 exposure. History provided by patient with additional history obtained from chart review.       Past Medical History:  Diagnosis Date  . Anxiety   . HIV (human immunodeficiency virus infection) (HCC)   . Sickle cell crisis (HCC)   . 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HCC)   . Vitamin D deficiency 10/2018    Patient Active Problem List   Diagnosis Date Noted  . 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 Acute pulmonary embolism (HCC) 02/10/2019  . Acute chest syndrome due to sickle cell crisis (HCC) 02/10/2019  . Sickle cell crisis (HCC) 01/17/2019  . Heart murmur 02/03/2017  . Sickle cell disease with crisis (HCC) 02/02/2017  . 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 Sickle cell pain crisis (HCC) 02/02/2017  . Sickle cell disease  (HCC) 12/20/2016  . Vitamin D deficiency 08/14/2016  . High risk medication use 09/27/2014  . Generalized anxiety disorder 05/19/2014  . GERD (gastroesophageal reflux disease) 05/19/2014  . HIV (human immunodeficiency virus infection) (HCC) 05/23/2012    History reviewed. No pertinent surgical history.     Family History  Problem Relation Age of Onset  . Sickle cell trait Mother   . Sickle cell trait Father   . Birth defects Maternal Grandmother   . Birth defects Paternal Grandmother     Social History   Tobacco Use  . Smoking status: Never Smoker  . Smokeless tobacco: Never Used  Substance Use Topics  . Alcohol use: No  . Drug use: No    Home Medications Prior to Admission medications   Medication Sig Start Date End Date Taking? Authorizing Provider  abacavir-dolutegravir-lamiVUDine (TRIUMEQ) 600-50-300 MG tablet Take 1 tablet by mouth at bedtime.     [provider]  apixaban (ELIQUIS) 5 MG TABS tablet Take 1 tablet (5 mg total) by mouth 2 (two) times daily. 02/16/19   Massie Maroon, [name]  cetirizine (ZYRTEC) 10 MG chewable tablet Chew 10 mg by mouth daily as needed for allergies.     [provider]  Cholecalciferol (VITAMIN D-3) 25 MCG (1000 UT) CAPS Take 1,000 Units by mouth 2 (two) times daily. 05/14/18   [provider]  diphenhydrAMINE (BENADRYL) 25 MG tablet Take 25 mg by mouth every 6 (six) hours as needed for allergies.    [provider]  hydroxyurea (  HYDREA) 500 MG capsule Take 4 capsules (2,000 mg total) by mouth at bedtime. 03/07/19   [name], [name]  Multiple Vitamin (MULTIVITAMIN WITH MINERALS) TABS tablet Take 1 tablet by mouth daily with breakfast.     [provider]  naloxone (NARCAN) 0.4 MG/ML injection As needed. Patient taking differently: Inject 0.4 mg into the muscle daily as needed (overdose).  11/28/18   [name], [name]  oxyCODONE-acetaminophen (PERCOCET) 10-325 MG tablet Take 1 tablet by  mouth every 4 (four) hours as needed for up to 15 days for pain. 04/24/19 05/09/19  [name], [name]  predniSONE (DELTASONE) 20 MG tablet Take 20 mg by mouth 2 (two) times daily after a meal. FOR 7-8 DAYS    [provider]  Vitamin D, Ergocalciferol, (DRISDOL) 1.25 MG (50000 UT) CAPS capsule Take 1 capsule (50,000 Units total) by mouth every 7 (seven) days. Patient not taking: Reported on 03/26/2019 11/01/18   [name], [name]    Allergies    Tape and Ketorolac  Review of Systems   Review of Systems  All other systems are reviewed and are negative for acute change except as noted in the HPI.   Physical Exam Updated Vital Signs BP 106/83   Pulse 80   Temp 98.1 F (36.7 C) (Oral)   Resp 16   Ht 6\' 3"  (1.905 m)   Wt 68.4 kg   SpO2 93%   BMI 18.85 kg/m   Physical Exam Vitals and nursing note reviewed.  Constitutional:      General: He is not in acute distress.    Appearance: He is not ill-appearing.  HENT:     Head: Normocephalic and atraumatic.     Right Ear: Tympanic membrane and external ear normal.     Left Ear: Tympanic membrane and external ear normal.     Nose: Nose normal.     Mouth/Throat:     Mouth: Mucous membranes are moist.     Pharynx: Oropharynx is clear.  Eyes:     General: No scleral icterus.       Right eye: No discharge.        Left eye: No discharge.     Extraocular Movements: Extraocular movements intact.     Conjunctiva/sclera: Conjunctivae normal.     Pupils: Pupils are equal, round, and reactive to light.  Neck:     Vascular: No JVD.  Cardiovascular:     Rate and Rhythm: Normal rate and regular rhythm.     Pulses: Normal pulses.          Radial pulses are 2+ on the right side and 2+ on the left side.     Heart sounds: Normal heart sounds.  Pulmonary:     Comments: Lungs clear to auscultation in all fields. Symmetric chest rise. No wheezing, rales, or rhonchi. Chest:     Chest wall: Tenderness present.       Comments:  Tenderness to palpation as depicted in image above. No overlying skin changes. Abdominal:     Comments: Abdomen is soft, non-distended, and non-tender in all quadrants. No rigidity, no guarding. No peritoneal signs.  Musculoskeletal:        General: Normal range of motion.     Cervical back: Normal range of motion.     Right lower leg: No edema.     Left lower leg: No edema.     Comments: Full ROM of all extremities.No joint swelling noted on exam. No cervical, thoracic,  or lumbar spinal tenderness to palpation. No paraspinal tenderness. No step offs, crepitus or deformity palpated.   Skin:    General: Skin is warm and dry.     Capillary Refill: Capillary refill takes less than 2 seconds.     Findings: No rash.  Neurological:     Mental Status: He is oriented to person, place, and time.     GCS: GCS eye subscore is 4. GCS verbal subscore is 5. GCS motor subscore is 6.     Comments: Fluent speech, no facial droop.  Psychiatric:        Behavior: Behavior normal.     ED Results / Procedures / Treatments   Labs (all labs ordered are listed, but only abnormal results are displayed) Labs Reviewed  COMPREHENSIVE METABOLIC PANEL - Abnormal; Notable for the following components:      Result Value   CO2 21 (*)    Glucose, Bld 102 (*)    Total Bilirubin 8.0 (*)    All other components within normal limits  CBC WITH DIFFERENTIAL/PLATELET - Abnormal; Notable for the following components:   RBC 2.31 (*)    Hemoglobin 8.7 (*)    HCT 24.3 (*)    MCV 105.2 (*)    MCH 37.7 (*)    RDW 23.0 (*)    nRBC 9.6 (*)    Monocytes Absolute 1.8 (*)    All other components within normal limits  RETICULOCYTES - Abnormal; Notable for the following components:   RBC. 2.31 (*)    All other components within normal limits  CBC WITH DIFFERENTIAL/PLATELET - Abnormal; Notable for the following components:   RBC 2.28 (*)    Hemoglobin 8.5 (*)    HCT 24.1 (*)    MCV 105.7 (*)    MCH 37.3 (*)    RDW  21.6 (*)    nRBC 8.8 (*)    Monocytes Absolute 1.3 (*)    nRBC 15 (*)    All other components within normal limits  RETICULOCYTES - Abnormal; Notable for the following components:   Retic Ct Pct 18.8 (*)    RBC. 2.28 (*)    Retic Count, Absolute 428.4 (*)    Immature Retic Fract 26.7 (*)    All other components within normal limits  PATHOLOGIST SMEAR REVIEW  TROPONIN I (HIGH SENSITIVITY)  TROPONIN I (HIGH SENSITIVITY)    EKG EKG Interpretation  Date/Time:  Sunday April 23 2019 06:12:46 EST Ventricular Rate:  90 PR Interval:  164 QRS Duration: 88 QT Interval:  376 QTC Calculation: 459 R Axis:   82 Text Interpretation: Normal sinus rhythm Possible Lateral infarct , age undetermined Abnormal ECG Confirmed by Virgel Manifold [phone]) on 04/23/2019 10:58:11 AM   Radiology DG Chest 2 View  Result Date: 04/23/2019 CLINICAL DATA:  Shortness of breath. Sickle cell crisis. EXAM: CHEST - 2 VIEW COMPARISON:  Radiograph and CT 02/09/2019 FINDINGS: Right chest port has been removed. Upper normal heart size with normal mediastinal contours. Bronchial thickening without focal airspace disease. Mild scarring in the right middle lobe no pneumothorax or pleural effusion. No acute osseous abnormalities are seen. IMPRESSION: 1. Bronchial thickening without focal airspace disease. 2. Right middle lobe scarring. Electronically Signed   By: Keith Rake M.D.   On: 04/23/2019 06:32    Procedures Procedures (including critical care time)  Medications Ordered in ED Medications  ondansetron (ZOFRAN) injection 4 mg (has no administration in time range)  sodium chloride flush (NS) 0.9 % injection 3 mL (3  mLs Intravenous Given 04/23/19 1028)  HYDROmorphone (DILAUDID) injection 0.5 mg (0.5 mg Subcutaneous Given 04/23/19 0604)  HYDROmorphone (DILAUDID) injection 2 mg (2 mg Intravenous Given 04/23/19 1029)    Or  HYDROmorphone (DILAUDID) injection 2 mg ( Subcutaneous See Alternative 04/23/19 1029)   HYDROmorphone (DILAUDID) injection 2 mg (2 mg Intravenous Given 04/23/19 1106)    Or  HYDROmorphone (DILAUDID) injection 2 mg ( Subcutaneous See Alternative 04/23/19 1106)  HYDROmorphone (DILAUDID) injection 2 mg (2 mg Intravenous Given 04/23/19 1209)    Or  HYDROmorphone (DILAUDID) injection 2 mg ( Subcutaneous See Alternative 04/23/19 1209)  diphenhydrAMINE (BENADRYL) capsule 25 mg (25 mg Oral Given 04/23/19 1027)  diphenhydrAMINE (BENADRYL) injection 25 mg (25 mg Intravenous Given 04/23/19 1245)    ED Course  I have reviewed the triage vital signs and the nursing notes.  Pertinent labs & imaging results that were available during my care of the patient were reviewed by me and considered in my medical decision making (see chart for details).    MDM Rules/Calculators/A&P                       29 y.o. male with PMH/o Sickle Cell anemia who presents for evaluation of sickle cell related pain.  Patient with temperature of 99.1 on arrival, 98.1 when rechecked, non-toxic appearing, sitting comfortably on examination table. Vital signs reviewed and stable. SpO2 is 93% on room air which patient reports his baseline is low 90s and he wears home O2 at night.  He is tolerating PO intake, will give analgesics. We will reassess patient after 3 doses. Initials labs ordered.   Labs reviewed and overall consistent with baseline No leukocytosis. Troponin 4 and 8. Chest xray viewed by me without infiltrate. The radiologist comments on  bronchial thickening without focal airspace disease and right middle lobe scarring.  Patient reports pain improved after 3 rounds of pain medication. He feels as if he can manage symptoms at home. He ambulated in the department without hypoxia or respiratory distress. Recommended patient have CT scan of his chest given history of acute chest and PE and he is reporting chest pain today. He politely refuses CT scan saying this pain feels different. He is aware of the risks of not having CT  scan today. He has aware of the risks of not having imaging today and that he could have worsening illness. Discussed with ED pharmacist Barbara Cower who agrees with plan to resume eliquis as previously dosed. Patient has prescription already filled at home. Will discharge home. Patient is stable at time of disposition. Very strict return precautions discussed including shortness of breath, chest pain, cough, fever, chills.. Recommend pcp follow up. Findings and plan of care discussed with supervising physician Dr. Juleen China.   Portions of this note were generated with Scientist, clinical (histocompatibility and immunogenetics). Dictation errors may occur despite best attempts at proofreading.   Final Clinical Impression(s) / ED Diagnoses Final diagnoses:  Sickle cell pain crisis [facility])    Rx / DC Orders ED Discharge Orders    None       Kathyrn Lass 04/23/19 1335    Raeford Razor, MD 04/24/19 [phone]

## 2019-04-23 NOTE — ED Triage Notes (Signed)
Pt reports generalized sickle cell crisis, went to [facility] and [facility] due to wait time. Pt also having some SOB and CP since this afternoon

## 2019-04-23 NOTE — ED Notes (Signed)
Pt requests blood draw with IV.

## 2019-04-23 NOTE — Discharge Instructions (Addendum)
You have been seen today for sickle cell pain. Please read and follow all provided instructions. Return to the emergency room for worsening condition or new concerning symptoms including shortness of breath, chest pain, difficulty breathing, fever  1. Medications:  Continue usual home medications Take medications as prescribed. Please review all of the medicines and only take them if you do not have an allergy to them.   2. Treatment: rest, drink plenty of fluids  3. Follow Up:  Please follow up with primary care provider by scheduling an appointment as soon as possible for a visit  -Call first thing tomorrow morning to discuss eliquis. Start taking tonight    ?

## 2019-04-23 NOTE — ED Notes (Signed)
Ambulated pt, no assistance needed, oxygen around 92-93%. Pt denies chest pain, sob, dizziness. Pt back in bed.

## 2019-04-24 ENCOUNTER — Other Ambulatory Visit: Payer: Self-pay | Admitting: Family Medicine

## 2019-04-24 ENCOUNTER — Telehealth: Payer: Self-pay | Admitting: Family Medicine

## 2019-04-24 DIAGNOSIS — F411 Generalized anxiety disorder: Secondary | ICD-10-CM

## 2019-04-24 NOTE — Telephone Encounter (Signed)
Refill request for xanax. Please advise. Thanks!

## 2019-04-24 NOTE — Telephone Encounter (Signed)
done

## 2019-04-25 LAB — PATHOLOGIST SMEAR REVIEW

## 2019-05-11 ENCOUNTER — Telehealth: Payer: Self-pay

## 2019-05-11 ENCOUNTER — Other Ambulatory Visit: Payer: Self-pay | Admitting: Family Medicine

## 2019-05-11 DIAGNOSIS — G8929 Other chronic pain: Secondary | ICD-10-CM

## 2019-05-11 DIAGNOSIS — D571 Sickle-cell disease without crisis: Secondary | ICD-10-CM

## 2019-05-11 NOTE — Telephone Encounter (Signed)
Patient need Oxycodone refilled.

## 2019-05-12 ENCOUNTER — Other Ambulatory Visit: Payer: Self-pay | Admitting: Family Medicine

## 2019-05-12 DIAGNOSIS — G8929 Other chronic pain: Secondary | ICD-10-CM

## 2019-05-12 DIAGNOSIS — D571 Sickle-cell disease without crisis: Secondary | ICD-10-CM

## 2019-05-12 MED ORDER — OXYCODONE-ACETAMINOPHEN 10-325 MG PO TABS: 1.0000 | ORAL_TABLET | ORAL | 0 refills | Status: DC | PRN

## 2019-05-13 MED ORDER — POLYETHYLENE GLYCOL 3350 17 GM/SCOOP PO POWD
17.00 | ORAL | Status: DC
Start: ? — End: 2019-05-13

## 2019-05-13 MED ORDER — LACTOBACILLUS ACID-PECTIN PO CAPS
1.00 | ORAL_CAPSULE | ORAL | Status: DC
Start: ? — End: 2019-05-13

## 2019-05-13 MED ORDER — QUINERVA 260 MG PO TABS
650.00 | ORAL_TABLET | ORAL | Status: DC
Start: ? — End: 2019-05-13

## 2019-05-13 MED ORDER — Medication
1.00 | Status: DC
Start: 2019-05-13 — End: 2019-05-13

## 2019-05-13 MED ORDER — SB BRONCHIAL 12.5-200 MG PO TABS
25.00 | ORAL_TABLET | ORAL | Status: DC
Start: ? — End: 2019-05-13

## 2019-05-13 MED ORDER — Medication
Status: DC
Start: ? — End: 2019-05-13

## 2019-05-13 MED ORDER — GRAPE FLAVOR LIQD
15.00 | Status: DC
Start: ? — End: 2019-05-13

## 2019-05-13 MED ORDER — CVS EAR DROPS OT
40.00 | OTIC | Status: DC
Start: 2019-05-14 — End: 2019-05-13

## 2019-05-13 MED ORDER — VALULINE SHORT LEG WALKER SM MISC
2000.00 | Status: DC
Start: 2019-05-14 — End: 2019-05-13

## 2019-05-13 MED ORDER — KEFLEX 125 MG/5ML PO SUSR
2.00 | ORAL | Status: DC
Start: 2019-05-14 — End: 2019-05-13

## 2019-05-13 MED ORDER — BENZOCAINE 10 % MT GEL
OROMUCOSAL | Status: DC
Start: ? — End: 2019-05-13

## 2019-05-13 MED ORDER — GENERIC EXTERNAL MEDICATION
Status: DC
Start: ? — End: 2019-05-13

## 2019-05-13 MED ORDER — BARO-CAT PO
50.00 | ORAL | Status: DC
Start: ? — End: 2019-05-13

## 2019-05-13 MED ORDER — ABACAVIR-DOLUTEGRAVIR-LAMIVUD 600-50-300 MG PO TABS
1.00 | ORAL_TABLET | ORAL | Status: DC
Start: 2019-05-13 — End: 2019-05-13

## 2019-05-13 MED ORDER — EQUATE NICOTINE 4 MG MT GUM
4.00 | CHEWING_GUM | OROMUCOSAL | Status: DC
Start: ? — End: 2019-05-13

## 2019-05-13 MED ORDER — ALBA-3 EX
200.00 | CUTANEOUS | Status: DC
Start: 2019-05-13 — End: 2019-05-13

## 2019-05-24 ENCOUNTER — Telehealth: Payer: Self-pay | Admitting: Family Medicine

## 2019-05-25 ENCOUNTER — Other Ambulatory Visit: Payer: Self-pay | Admitting: Family Medicine

## 2019-05-25 DIAGNOSIS — D571 Sickle-cell disease without crisis: Secondary | ICD-10-CM

## 2019-05-25 DIAGNOSIS — G8929 Other chronic pain: Secondary | ICD-10-CM

## 2019-05-25 MED ORDER — OXYCODONE-ACETAMINOPHEN 10-325 MG PO TABS: 1.0000 | ORAL_TABLET | ORAL | 0 refills | Status: DC | PRN

## 2019-05-26 ENCOUNTER — Non-Acute Institutional Stay (HOSPITAL_BASED_OUTPATIENT_CLINIC_OR_DEPARTMENT_OTHER)
Admission: AD | Admit: 2019-05-26 | Discharge: 2019-05-26 | Disposition: A | Discharge status: Home / Self Care | Payer: Medicaid - Out of State | Source: Ambulatory Visit | Attending: Internal Medicine | Admitting: Internal Medicine

## 2019-05-26 ENCOUNTER — Emergency Department (HOSPITAL_COMMUNITY)
Admission: EM | Admit: 2019-05-26 | Discharge: 2019-05-26 | Disposition: A | Discharge status: Home / Self Care | Payer: Medicaid - Out of State | Attending: Emergency Medicine | Admitting: Emergency Medicine

## 2019-05-26 ENCOUNTER — Other Ambulatory Visit: Payer: Self-pay | Admitting: Family Medicine

## 2019-05-26 ENCOUNTER — Telehealth: Payer: Self-pay | Admitting: Family Medicine

## 2019-05-26 ENCOUNTER — Other Ambulatory Visit: Payer: Self-pay

## 2019-05-26 ENCOUNTER — Encounter (HOSPITAL_COMMUNITY): Payer: Self-pay | Admitting: Emergency Medicine

## 2019-05-26 ENCOUNTER — Ambulatory Visit: Payer: Medicaid - Out of State | Admitting: Family Medicine

## 2019-05-26 DIAGNOSIS — Z7901 Long term (current) use of anticoagulants: Secondary | ICD-10-CM | POA: Insufficient documentation

## 2019-05-26 DIAGNOSIS — Z79899 Other long term (current) drug therapy: Secondary | ICD-10-CM | POA: Insufficient documentation

## 2019-05-26 DIAGNOSIS — D571 Sickle-cell disease without crisis: Secondary | ICD-10-CM

## 2019-05-26 DIAGNOSIS — Z86711 Personal history of pulmonary embolism: Secondary | ICD-10-CM | POA: Insufficient documentation

## 2019-05-26 DIAGNOSIS — D5700 Hb-SS disease with crisis, unspecified: Secondary | ICD-10-CM

## 2019-05-26 DIAGNOSIS — Z79891 Long term (current) use of opiate analgesic: Secondary | ICD-10-CM | POA: Diagnosis not present

## 2019-05-26 DIAGNOSIS — G8929 Other chronic pain: Secondary | ICD-10-CM

## 2019-05-26 DIAGNOSIS — Z21 Asymptomatic human immunodeficiency virus [HIV] infection status: Secondary | ICD-10-CM | POA: Insufficient documentation

## 2019-05-26 DIAGNOSIS — F1120 Opioid dependence, uncomplicated: Secondary | ICD-10-CM | POA: Insufficient documentation

## 2019-05-26 DIAGNOSIS — G894 Chronic pain syndrome: Secondary | ICD-10-CM | POA: Insufficient documentation

## 2019-05-26 LAB — CBC WITH DIFFERENTIAL/PLATELET
Abs Immature Granulocytes: 0.02 10*3/uL (ref 0.00–0.07)
Basophils Absolute: 0.1 10*3/uL (ref 0.0–0.1)
Basophils Relative: 1 %
Eosinophils Absolute: 0.2 10*3/uL (ref 0.0–0.5)
Eosinophils Relative: 2 %
HCT: 24.9 % — ABNORMAL LOW (ref 39.0–52.0)
Hemoglobin: 8.8 g/dL — ABNORMAL LOW (ref 13.0–17.0)
Immature Granulocytes: 0 %
Lymphocytes Relative: 60 %
Lymphs Abs: 6.2 10*3/uL — ABNORMAL HIGH (ref 0.7–4.0)
MCH: 40 pg — ABNORMAL HIGH (ref 26.0–34.0)
MCHC: 35.3 g/dL (ref 30.0–36.0)
MCV: 113.2 fL — ABNORMAL HIGH (ref 80.0–100.0)
Monocytes Absolute: 1.1 10*3/uL — ABNORMAL HIGH (ref 0.1–1.0)
Monocytes Relative: 10 %
Neutro Abs: 2.9 10*3/uL (ref 1.7–7.7)
Neutrophils Relative %: 27 %
Platelets: 247 10*3/uL (ref 150–400)
RBC: 2.2 MIL/uL — ABNORMAL LOW (ref 4.22–5.81)
RDW: 25.4 % — ABNORMAL HIGH (ref 11.5–15.5)
WBC: 10.5 10*3/uL (ref 4.0–10.5)
nRBC: 0 % (ref 0.0–0.2)

## 2019-05-26 LAB — COMPREHENSIVE METABOLIC PANEL
ALT: 26 U/L (ref 0–44)
AST: 39 U/L (ref 15–41)
Albumin: 3.8 g/dL (ref 3.5–5.0)
Alkaline Phosphatase: 61 U/L (ref 38–126)
Anion gap: 8 (ref 5–15)
BUN: 9 mg/dL (ref 6–20)
CO2: 22 mmol/L (ref 22–32)
Calcium: 9.2 mg/dL (ref 8.9–10.3)
Chloride: 107 mmol/L (ref 98–111)
Creatinine, Ser: 0.82 mg/dL (ref 0.61–1.24)
GFR calc Af Amer: >60 mL/min (ref >60)
GFR calc non Af Amer: >60 mL/min (ref >60)
Glucose, Bld: 95 mg/dL (ref 70–99)
Potassium: 4 mmol/L (ref 3.5–5.1)
Sodium: 137 mmol/L (ref 135–145)
Total Bilirubin: 6.6 mg/dL — ABNORMAL HIGH (ref 0.3–1.2)
Total Protein: 8.4 g/dL — ABNORMAL HIGH (ref 6.5–8.1)

## 2019-05-26 LAB — RETICULOCYTES
Immature Retic Fract: 34.1 % — ABNORMAL HIGH (ref 2.3–15.9)
RBC.: 2.2 MIL/uL — ABNORMAL LOW (ref 4.22–5.81)
Retic Count, Absolute: 111.8 10*3/uL (ref 19.0–186.0)
Retic Ct Pct: 18.3 % — ABNORMAL HIGH (ref 0.4–3.1)

## 2019-05-26 MED ORDER — DIPHENHYDRAMINE HCL 25 MG PO CAPS
25.0000 mg | ORAL_CAPSULE | ORAL | Status: DC | PRN
  Administered 2019-05-26: 25 mg via ORAL
  Filled 2019-05-26: qty 1

## 2019-05-26 MED ORDER — DEXTROSE-NACL 5-0.45 % IV SOLN: INTRAVENOUS | Status: DC

## 2019-05-26 MED ORDER — SODIUM CHLORIDE 0.9% FLUSH: 9.0000 mL | INTRAVENOUS | Status: DC | PRN

## 2019-05-26 MED ORDER — ONDANSETRON HCL 4 MG/2ML IJ SOLN: 4.0000 mg | Freq: Four times a day (QID) | INTRAMUSCULAR | Status: DC | PRN

## 2019-05-26 MED ORDER — HYDROXYUREA 500 MG PO CAPS: 2000.0000 mg | ORAL_CAPSULE | Freq: Every day | ORAL | 3 refills | Status: DC

## 2019-05-26 MED ORDER — ACETAMINOPHEN 500 MG PO TABS
1000.0000 mg | ORAL_TABLET | Freq: Once | ORAL | Status: AC
  Administered 2019-05-26: 1000 mg via ORAL
  Filled 2019-05-26: qty 2

## 2019-05-26 MED ORDER — HYDROMORPHONE 1 MG/ML IV SOLN
INTRAVENOUS | Status: DC
  Administered 2019-05-26: 17.4 mg via INTRAVENOUS
  Administered 2019-05-26: 30 mg via INTRAVENOUS
  Filled 2019-05-26: qty 30

## 2019-05-26 MED ORDER — KETAMINE HCL 50 MG/5ML IJ SOSY: 0.3000 mg/kg | PREFILLED_SYRINGE | Freq: Once | INTRAMUSCULAR | Status: DC

## 2019-05-26 MED ORDER — HYDROMORPHONE HCL 1 MG/ML IJ SOLN
0.5000 mg | Freq: Once | INTRAMUSCULAR | Status: AC
  Administered 2019-05-26: 0.5 mg via SUBCUTANEOUS
  Filled 2019-05-26: qty 1

## 2019-05-26 MED ORDER — SODIUM CHLORIDE 0.9% FLUSH: 3.0000 mL | Freq: Once | INTRAVENOUS | Status: DC

## 2019-05-26 MED ORDER — NALOXONE HCL 0.4 MG/ML IJ SOLN: 0.4000 mg | INTRAMUSCULAR | Status: DC | PRN

## 2019-05-26 MED ORDER — HYDROMORPHONE HCL 1 MG/ML IJ SOLN: 1.0000 mg | INTRAMUSCULAR | Status: DC | PRN

## 2019-05-26 MED ORDER — DIPHENHYDRAMINE HCL 50 MG/ML IJ SOLN
12.5000 mg | Freq: Once | INTRAMUSCULAR | Status: AC
  Administered 2019-05-26: 12.5 mg via INTRAVENOUS
  Filled 2019-05-26: qty 1

## 2019-05-26 MED ORDER — ONDANSETRON 4 MG PO TBDP
4.0000 mg | ORAL_TABLET | Freq: Once | ORAL | Status: AC
  Administered 2019-05-26: 4 mg via ORAL
  Filled 2019-05-26: qty 1

## 2019-05-26 MED ORDER — ALPRAZOLAM 1 MG PO TABS: 1.0000 mg | ORAL_TABLET | Freq: Three times a day (TID) | ORAL | 0 refills | Status: DC | PRN

## 2019-05-26 MED ORDER — HYDROMORPHONE HCL 1 MG/ML IJ SOLN
0.5000 mg | INTRAMUSCULAR | Status: AC
  Administered 2019-05-26: 0.5 mg via INTRAVENOUS
  Filled 2019-05-26: qty 1

## 2019-05-26 MED ORDER — HYDROMORPHONE HCL 1 MG/ML IJ SOLN
1.0000 mg | INTRAMUSCULAR | Status: AC
  Administered 2019-05-26: 1 mg via INTRAVENOUS
  Filled 2019-05-26: qty 1

## 2019-05-26 NOTE — Discharge Instructions (Addendum)
[facility] will [facility]

## 2019-05-26 NOTE — Telephone Encounter (Signed)
Done

## 2019-05-26 NOTE — Discharge Instructions (Signed)
Recommend adding Ibuprofen 600 mg every 8 hours as needed. Resume home medications as discussed. Remember to dress like you live in [place].    Sickle Cell Anemia, Adult  Sickle cell anemia is a condition where your red blood cells are shaped like sickles. Red blood cells carry oxygen through the body. Sickle-shaped cells do not live as long as normal red blood cells. They also clump together and block blood from flowing through the blood vessels. This prevents the body from getting enough oxygen. Sickle cell anemia causes organ damage and pain. It also increases the risk of infection. Follow these instructions at home: Medicines  Take over-the-counter and prescription medicines only as told by your doctor.  If you were prescribed an antibiotic medicine, take it as told by your doctor. Do not stop taking the antibiotic even if you start to feel better.  If you develop a fever, do not take medicines to lower the fever right away. Tell your doctor about the fever. Managing pain, stiffness, and swelling  Try these methods to help with pain: ? Use a heating pad. ? Take a warm bath. ? Distract yourself, such as by watching TV. Eating and drinking  Drink enough fluid to keep your pee (urine) clear or pale yellow. Drink more in hot weather and during exercise.  Limit or avoid alcohol.  Eat a healthy diet. Eat plenty of fruits, vegetables, whole grains, and lean protein.  Take vitamins and supplements as told by your doctor. Traveling  When traveling, keep these with you: ? Your medical information. ? The names of your doctors. ? Your medicines.  If you need to take an airplane, talk to your doctor first. Activity  Rest often.  Avoid exercises that make your heart beat much faster, such as jogging. General instructions  Do not use products that have nicotine or tobacco, such as cigarettes and e-cigarettes. If you need help quitting, ask your doctor.  Consider wearing a  medical alert bracelet.  Avoid being in high places (high altitudes), such as mountains.  Avoid very hot or cold temperatures.  Avoid places where the temperature changes a lot.  Keep all follow-up visits as told by your doctor. This is important. Contact a doctor if:  A joint hurts.  Your feet or hands hurt or swell.  You feel tired (fatigued). Get help right away if:  You have symptoms of infection. These include: ? Fever. ? Chills. ? Being very tired. ? Irritability. ? Poor eating. ? Throwing up (vomiting).  You feel dizzy or faint.  You have new stomach pain, especially on the left side.  You have a an erection (priapism) that lasts more than 4 hours.  You have numbness in your arms or legs.  You have a hard time moving your arms or legs.  You have trouble talking.  You have pain that does not go away when you take medicine.  You are short of breath.  You are breathing fast.  You have a long-term cough.  You have pain in your chest.  You have a bad headache.  You have a stiff neck.  Your stomach looks bloated even though you did not eat much.  Your skin is pale.  You suddenly cannot see well. Summary  Sickle cell anemia is a condition where your red blood cells are shaped like sickles.  Follow your doctor's advice on ways to manage pain, food to eat, activities to do, and steps to take for safe travel.  Get medical  help right away if you have any signs of infection, such as a fever. This information is not intended to replace advice given to you by your health care provider. Make sure you discuss any questions you have with your health care provider. Document Revised: 07/29/2018 Document Reviewed: 05/12/2016 Elsevier Patient Education  2020 [employer].

## 2019-05-26 NOTE — H&P (Signed)
Sickle Martin [name] History and Physical   Date: 05/27/2019  Patient name: Martin [name] Medical record number: [id number] Date of birth: 16-Aug-1990 Age: 29 y.o. Gender: male PCP: Azzie Glatter, [name]  Attending physician: No att. providers found  Chief Complaint: Sickle cell disease  History of Present Illness: Martin [name] is a 29 year old male with a medical history significant for sickle cell disease, chronic pain syndrome, opiate dependence and tolerance, HIV disease on antivirals, history of PE on apixaban presents to sickle cell day infusion [name] complaining of generalized pain that is consistent with his typical crisis.  Patient states the pain intensity has been increasing over the past several days and has been unrelieved by home medications.  He attributes pain crisis to going to a birthday party at a trampoline park and overexerting himself.  He also states that he got extremely cold on that day and immediately started aching.  Patient last had Dilaudid in the emergency department this a.m. without complete resolution.  Agreed with ER provider that patient was appropriate to transition to day infusion [name] for pain management and extended observation.  Patient arrived in stable condition and pain intensity is 8/10 characterized as constant and aching.  Patient states that he has been unable to get comfortable.  No sick contacts, recent travel, or exposure to COVID-19 denies headache, fever, chills, shortness of breath, nausea, vomiting, or diarrhea.   Meds: No medications prior to admission.    Allergies: Tape and Ketorolac Past Medical History:  Diagnosis Date  . Anxiety   . HIV (human immunodeficiency virus infection) ([place])   . Sickle cell crisis ([place])   . 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[place])   . Vitamin D deficiency 10/2018   No past surgical history on file. Family History  Problem Relation Age of Onset  . Sickle cell trait Mother   . Sickle cell trait  Father   . Birth defects Maternal Grandmother   . Birth defects Paternal Grandmother    Social History   Socioeconomic History  . Marital status: Single    Spouse name: Not on file  . Number of children: Not on file  . Years of education: Not on file  . Highest education level: Not on file  Occupational History  . Not on file  Tobacco Use  . Smoking status: Never Smoker  . Smokeless tobacco: Never Used  Substance and Sexual Activity  . Alcohol use: No  . Drug use: No  . Sexual activity: Yes    Birth control/protection: Condom  Other Topics Concern  . Not on file  Social History Narrative  . Not on file   Social Determinants of Health   Financial Resource Strain:   . Difficulty of Paying Living Expenses: Not on file  Food Insecurity:   . Worried About Charity fundraiser in the Last Year: Not on file  . Ran Out of Food in the Last Year: Not on file  Transportation Needs:   . Lack of Transportation (Medical): Not on file  . Lack of Transportation (Non-Medical): Not on file  Physical Activity:   . Days of Exercise per Week: Not on file  . Minutes of Exercise per Session: Not on file  Stress:   . Feeling of Stress : Not on file  Social Connections:   . Frequency of Communication with Friends and Family: Not on file  . Frequency of Social Gatherings with Friends and Family: Not on file  . Attends Religious Services: Not  on file  . Active Member of Clubs or Organizations: Not on file  . Attends Banker Meetings: Not on file  . Marital Status: Not on file  Intimate Partner Violence:   . Fear of Current or Ex-Partner: Not on file  . Emotionally Abused: Not on file  . Physically Abused: Not on file  . Sexually Abused: Not on file   Review of Systems  Constitutional: Negative.   HENT: Negative.   Eyes: Negative.   Respiratory: Negative.   Gastrointestinal: Negative.   Genitourinary: Negative.   Musculoskeletal: Positive for back pain and joint pain.   Skin: Negative.   Neurological: Negative.   Psychiatric/Behavioral: Negative.      Physical Exam: Blood pressure 106/67, pulse 77, temperature 98.1 F (36.7 C), temperature source Oral, resp. rate 10, SpO2 92 %. Physical Exam Constitutional:      Appearance: Normal appearance.  HENT:     Head: Normocephalic.  Eyes:     General: No scleral icterus.    Pupils: Pupils are equal, round, and reactive to light.  Cardiovascular:     Rate and Rhythm: Normal rate and regular rhythm.     Pulses: Normal pulses.  Pulmonary:     Effort: Pulmonary effort is normal.     Breath sounds: Normal breath sounds.  Abdominal:     General: Abdomen is flat. Bowel sounds are normal.  Skin:    General: Skin is warm.  Neurological:     General: No focal deficit present.     Mental Status: He is alert. Mental status is at baseline.  Psychiatric:        Mood and Affect: Mood normal.        Behavior: Behavior normal.        Thought Content: Thought content normal.        Judgment: Judgment normal.      Lab results: No results found for this or any previous visit (from the past 24 hour(s)).  Imaging results:  No results found.   Assessment & Plan: Patient admitted to sickle cell day infusion [name] for management of pain crisis.  Patient is opiate tolerant Initiate IV dilaudid PCA. Settings of 0.6 mg,10 minute lockout, and 4 mg per hour IV fluids, D5.45% saline at 100 ml/hour Tylenol 1000 mg by mouth times one dose Patient was in the emergency department this am, reviewed all laboratory values, consistent with patient's baseline.   Pain intensity will be reevaluated in context of functioning and relationship to baseline as care progress If pain intensity remains elevated and/or sudden change in hemodynamic stability transition to inpatient services for higher level of care.    [name]  APRN, [name], [name] Patient Care [facility] [address] [place], [place] [address] [phone]  05/27/2019, 10:59 AM

## 2019-05-26 NOTE — ED Triage Notes (Signed)
Patient complaining of sickle cell pain crisis all over. He states his home medications is not working.

## 2019-05-26 NOTE — ED Provider Notes (Signed)
[facility]-EMERGENCY DEPT Provider Note   CSN: [id number] Arrival date & time: 05/26/19  0217     History Chief Complaint  Patient presents with  . Sickle Cell Pain Crisis    Martin [name] is a 29 y.o. male.  Patient with history of sickle cell anemia, HIV, PE on Eliquis presents with generalized pain c/w sickle cell crisis. No fever, cough, SOB, vomiting. He has medications at home but reports they are not working to control pain. He was seen last night at [facility] and reports he is no better today, prompting return to the ED.   The history is provided by the patient. No language interpreter was used.  Sickle Cell Pain Crisis Associated symptoms: no chest pain, no congestion, no fever, no shortness of breath and no vomiting        Past Medical History:  Diagnosis Date  . Anxiety   . HIV (human immunodeficiency virus infection) (HCC)   . Sickle cell crisis (HCC)   . 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HCC)   . Vitamin D deficiency 10/2018    Patient Active Problem List   Diagnosis Date Noted  . 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 Acute pulmonary embolism (HCC) 02/10/2019  . Acute chest syndrome due to sickle cell crisis (HCC) 02/10/2019  . Sickle cell crisis (HCC) 01/17/2019  . Heart murmur 02/03/2017  . Sickle cell disease with crisis (HCC) 02/02/2017  . 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 Sickle cell pain crisis (HCC) 02/02/2017  . 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HCC) 12/20/2016  . Vitamin D deficiency 08/14/2016  . High risk medication use 09/27/2014  . Generalized anxiety disorder 05/19/2014  . GERD (gastroesophageal reflux disease) 05/19/2014  . HIV (human immunodeficiency virus infection) (HCC) 05/23/2012    History reviewed. No pertinent surgical history.     Family History  Problem Relation Age of Onset  . Sickle cell trait Mother   . Sickle cell trait Father   . Birth defects Maternal Grandmother   . Birth defects  Paternal Grandmother     Social History   Tobacco Use  . Smoking status: Never Smoker  . Smokeless tobacco: Never Used  Substance Use Topics  . Alcohol use: No  . Drug use: No    Home Medications Prior to Admission medications   Medication Sig Start Date End Date Taking? Authorizing Provider  abacavir-dolutegravir-lamiVUDine (TRIUMEQ) 600-50-300 MG tablet Take 1 tablet by mouth at bedtime.     [provider]  apixaban (ELIQUIS) 5 MG TABS tablet Take 1 tablet (5 mg total) by mouth 2 (two) times daily. 02/16/19   Massie Maroon, [name]  cetirizine (ZYRTEC) 10 MG chewable tablet Chew 10 mg by mouth daily as needed for allergies.     [provider]  Cholecalciferol (VITAMIN D-3) 25 MCG (1000 UT) CAPS Take 1,000 Units by mouth 2 (two) times daily. 05/14/18   [provider]  diphenhydrAMINE (BENADRYL) 25 MG tablet Take 25 mg by mouth every 6 (six) hours as needed for allergies.    [provider]  hydroxyurea (HYDREA) 500 MG capsule Take 4 capsules (2,000 mg total) by mouth at bedtime. 03/07/19   [name], [name]  Multiple Vitamin (MULTIVITAMIN WITH MINERALS) TABS tablet Take 1 tablet by mouth daily with breakfast.     [provider]  naloxone (NARCAN) 0.4 MG/ML injection As needed. Patient taking differently: Inject 0.4 mg into the muscle daily as needed (overdose).  11/28/18   [name]  M, [name]  oxyCODONE-acetaminophen (PERCOCET) 10-325 MG tablet Take 1 tablet by mouth every 4 (four) hours as needed for up to 15 days for pain. 05/26/19 06/10/19  [name], [name]  predniSONE (DELTASONE) 20 MG tablet Take 20 mg by mouth 2 (two) times daily after a meal. FOR 7-8 DAYS    [provider]  Vitamin D, Ergocalciferol, (DRISDOL) 1.25 MG (50000 UT) CAPS capsule Take 1 capsule (50,000 Units total) by mouth every 7 (seven) days. Patient not taking: Reported on 03/26/2019 11/01/18   [name], [name]    Allergies    Tape and  Ketorolac  Review of Systems   Review of Systems  Constitutional: Negative for chills and fever.  HENT: Negative.  Negative for congestion.   Respiratory: Negative.  Negative for shortness of breath.   Cardiovascular: Negative.  Negative for chest pain.  Gastrointestinal: Negative.  Negative for vomiting.  Musculoskeletal: Positive for myalgias.  Skin: Negative.   Neurological: Negative.     Physical Exam Updated Vital Signs BP 100/74   Pulse 73   Temp 98.6 F (37 C) (Oral)   Resp 18   Ht 6\' 3"  (1.905 m)   Wt 68 kg   SpO2 93%   BMI 18.75 kg/m   Physical Exam Vitals and nursing note reviewed.  Constitutional:      Appearance: He is well-developed.  HENT:     Head: Normocephalic.  Cardiovascular:     Rate and Rhythm: Normal rate and regular rhythm.  Pulmonary:     Effort: Pulmonary effort is normal.     Breath sounds: Normal breath sounds. No wheezing, rhonchi or rales.  Abdominal:     General: Bowel sounds are normal.     Palpations: Abdomen is soft.     Tenderness: There is no abdominal tenderness. There is no guarding or rebound.  Musculoskeletal:        General: No swelling. Normal range of motion.     Cervical back: Normal range of motion and neck supple.  Skin:    General: Skin is warm and dry.     Findings: No rash.  Neurological:     Mental Status: He is alert and oriented to person, place, and time.     ED Results / Procedures / Treatments   Labs (all labs ordered are listed, but only abnormal results are displayed) Labs Reviewed  COMPREHENSIVE METABOLIC PANEL  CBC WITH DIFFERENTIAL/PLATELET  RETICULOCYTES    EKG None  Radiology No results found.  Procedures Procedures (including critical care time)  Medications Ordered in ED Medications  sodium chloride flush (NS) 0.9 % injection 3 mL (3 mLs Intravenous Not Given 05/26/19 0325)  HYDROmorphone (DILAUDID) injection 0.5 mg (has no administration in time range)  HYDROmorphone (DILAUDID)  injection 1 mg (has no administration in time range)  ketamine 50 mg in normal saline 5 mL (10 mg/mL) syringe (has no administration in time range)  HYDROmorphone (DILAUDID) injection 0.5 mg (0.5 mg Subcutaneous Given 05/26/19 0311)  ondansetron (ZOFRAN-ODT) disintegrating tablet 4 mg (4 mg Oral Given 05/26/19 07/24/19)    ED Course  I have reviewed the triage vital signs and the nursing notes.  Pertinent labs & imaging results that were available during my care of the patient were reviewed by me and considered in my medical decision making (see chart for details).    MDM Rules/Calculators/A&P  Patient to ED with pain c/w sickle cell crisis. No fever or CP.   VSS. Labs pending. Sickle Cell protocol order set used to evaluate and treat. Will re-evaluate after medications.   He has received 3 doses SQ Dilaudid as IV isn't established. He reports pain is improved but still significant. IV team consultation requested with subsequent IV dosing of pain medication ordered. Anticipate admission to [facility].   Patient care signed out to oncoming provider for reassessment of pain and likely admission call to [facility].   Final Clinical Impression(s) / ED Diagnoses Final diagnoses:  None   1. Sickle Cell anemia with pain  Rx / DC Orders ED Discharge Orders    None       Charlann Lange, PA-C 05/26/19 0602    Molpus, Jenny Reichmann, MD 05/26/19 [phone]

## 2019-05-26 NOTE — ED Notes (Signed)
IV team at bedside 

## 2019-05-26 NOTE — ED Provider Notes (Signed)
Pt with hx of HIV, sickle cell disease naive to opiate here with sickle cell related pain without complaints suggestive of acute chest.  BP noted to be low in the 90s systolic.  Initial O2 of 90% on RA.  Pt did not report SOB.  Pt report he normally wear supplemental O2 2L at night and baseline O2 is usually 91%.  Plan to provide pain management and anticipate going to Day Hospital in the AM.    8:21 AM Appreciate consultation from [facility] specialist [place] who agrees to accept pt to [facility] for further management of his sickle cell pain.    BP 101/71   Pulse 66   Temp 98.6 F (37 C) (Oral)   Resp 15   Ht 6\' 3"  (1.905 m)   Wt 68 kg   SpO2 100%   BMI 18.75 kg/m   Results for orders placed or performed during the hospital encounter of 05/26/19  Comprehensive metabolic panel  Result Value Ref Range   Sodium 137 135 - 145 mmol/L   Potassium 4.0 3.5 - 5.1 mmol/L   Chloride 107 98 - 111 mmol/L   CO2 22 22 - 32 mmol/L   Glucose, Bld 95 70 - 99 mg/dL   BUN 9 6 - 20 mg/dL   Creatinine, Ser 07/24/19 0.61 - 1.24 mg/dL   Calcium 9.2 8.9 - 9.50 mg/dL   Total Protein 8.4 (H) 6.5 - 8.1 g/dL   Albumin 3.8 3.5 - 5.0 g/dL   AST 39 15 - 41 U/L   ALT 26 0 - 44 U/L   Alkaline Phosphatase 61 38 - 126 U/L   Total Bilirubin 6.6 (H) 0.3 - 1.2 mg/dL   GFR calc non Af Amer >60 >60 mL/min   GFR calc Af Amer >60 >60 mL/min   Anion gap 8 5 - 15  CBC with Differential  Result Value Ref Range   WBC 10.5 4.0 - 10.5 K/uL   RBC 2.20 (L) 4.22 - 5.81 MIL/uL   Hemoglobin 8.8 (L) 13.0 - 17.0 g/dL   HCT 93.2 (L) 67.1 - 24.5 %   MCV 113.2 (H) 80.0 - 100.0 fL   MCH 40.0 (H) 26.0 - 34.0 pg   MCHC 35.3 30.0 - 36.0 g/dL   RDW 80.9 (H) 98.3 - 38.2 %   Platelets 247 150 - 400 K/uL   nRBC 0.0 0.0 - 0.2 %   Neutrophils Relative % 27 %   Neutro Abs 2.9 1.7 - 7.7 K/uL   Lymphocytes Relative 60 %   Lymphs Abs 6.2 (H) 0.7 - 4.0 K/uL   Monocytes Relative 10 %   Monocytes Absolute 1.1 (H) 0.1 - 1.0 K/uL   Eosinophils Relative 2 %   Eosinophils Absolute 0.2 0.0 - 0.5 K/uL   Basophils Relative 1 %   Basophils Absolute 0.1 0.0 - 0.1 K/uL   Immature Granulocytes 0 %   Abs Immature Granulocytes 0.02 0.00 - 0.07 K/uL   Agglutination PRESENT    50.5 Bodies PRESENT    Polychromasia PRESENT    Sickle Cells PRESENT    Target Cells PRESENT   Reticulocytes  Result Value Ref Range   Retic Ct Pct 18.3 (H) 0.4 - 3.1 %   RBC. 2.20 (L) 4.22 - 5.81 MIL/uL   Retic Count, Absolute 111.8 19.0 - 186.0 K/uL   Immature Retic Fract 34.1 (H) 2.3 - 15.9 %   No results found.    Carollee Massed, PA-C 05/26/19 0822    Molpus,  Jenny Reichmann, MD 05/31/19 2241

## 2019-05-26 NOTE — ED Notes (Signed)
Patient is awake and alert x4, blood pressure remains hypotensive post dilaudid.

## 2019-05-26 NOTE — Progress Notes (Signed)
Patient admitted to the day infusion hospital from [facility] for sickle cell pain. Initially, patient reported generalized pain rated 7/10. For pain management, patient placed on Dilaudid PCA, given 1000 mg Tylenol and hydrated with IV fluids. At discharge, patient rated pain at 4/10. Vital signs stable. Discharge instructions given. Patient alert, oriented and ambulatory at discharge.

## 2019-05-26 NOTE — ED Notes (Signed)
Urinal, ham sandwich, and coca-[employer]a given to patient

## 2019-05-27 NOTE — Discharge Summary (Signed)
[facility] Discharge [facility]   Patient ID: BRENON ANTOSH MRN: [id number] DOB/AGE: 05/09/1990 29 y.o.  Admit date: 05/26/2019 Discharge date: 05/27/2019  Primary Care Physician:  [name], [name]  Admission Diagnoses:  Active Problems:   Sickle cell pain crisis [facility])   Discharge Medications:  Allergies as of 05/26/2019      Reactions   Tape Rash, Other (See Comments)   PLEASE DO NOT USE THE CLEAR, THICK, "PLASTIC" TAPE- only paper tape is tolerated    Ketorolac Swelling, Other (See Comments)   Patient reports facial edema and left arm edema after administration.      Medication List    TAKE these medications   ALPRAZolam 1 MG tablet Commonly known as: XANAX Take 1 tablet (1 mg total) by mouth 3 (three) times daily as needed for anxiety or sleep.   apixaban 5 MG Tabs tablet Commonly known as: Eliquis Take 1 tablet (5 mg total) by mouth 2 (two) times daily.   diphenhydrAMINE 25 MG tablet Commonly known as: BENADRYL Take 25 mg by mouth every 6 (six) hours as needed for allergies.   hydroxyurea 500 MG capsule Commonly known as: HYDREA Take 4 capsules (2,000 mg total) by mouth at bedtime.   multivitamin with minerals Tabs tablet Take 1 tablet by mouth daily with breakfast.   naloxone 0.4 MG/ML injection Commonly known as: NARCAN As needed. What changed:   how much to take  how to take this  when to take this  reasons to take this  additional instructions   oxyCODONE-acetaminophen 10-325 MG tablet Commonly known as: PERCOCET Take 1 tablet by mouth every 4 (four) hours as needed for up to 15 days for pain.   Triumeq 600-50-300 MG tablet Generic drug: abacavir-dolutegravir-lamiVUDine Take 1 tablet by mouth at bedtime.   Vitamin D (Ergocalciferol) 1.25 MG (50000 UNIT) Caps capsule Commonly known as: DRISDOL Take 1 capsule (50,000 Units total) by mouth every 7 (seven) days.        Consults:  None  Significant Diagnostic  Studies:  No results found.  History of present illness:  Martin [name] is a 29 year old male with a medical history significant for sickle cell disease, chronic pain syndrome, opiate dependence and tolerance, HIV disease on antivirals, history of PE on apixaban presents to sickle cell day infusion center complaining of generalized pain that is consistent with his typical crisis.  Patient states the pain intensity has been increasing over the past several days and has been unrelieved by home medications.  He attributes pain crisis to going to a birthday party at a trampoline park and overexerting himself.  He also states that he got extremely cold on that day and immediately started aching.  Patient last had Dilaudid in the emergency department this a.m. without complete resolution.  Agreed with ER provider that patient was appropriate to transition to day infusion center for pain management and extended observation.  Patient arrived in stable condition and pain intensity is 8/10 characterized as constant and aching.  Patient states that he has been unable to get comfortable.  No sick contacts, recent travel, or exposure to COVID-19 denies headache, fever, chills, shortness of breath, nausea, vomiting, or diarrhea. [facility] Course: Patient admitted to sickle cell day infusion center for management of pain crisis. Reviewed all laboratory values, consistent with patient's baseline.  Did not warrant repeating. Pain managed with IV Dilaudid via PCA.  Settings of 0.6 mg, 10-minute lockout, and 4 mg/h. Tylenol  1000 mg by mouth x1 IV fluids, D5 0.45% saline at 100 mL/h Pain intensity decreased to 4/10, which is markedly improved.  Patient does not warrant admission at this time.  Advised to add ibuprofen 800 mg every 8 hours with food.  Also, continue to hydrate with 64 ounces of fluid. Advised to follow-up with PCP for medication management, first available appointment.  Patient  expressed understanding.  Patient is alert, oriented, and ambulating without assistance.  He will discharge home in a hemodynamically stable condition.  Discharge instructions: Resume all home medications.   Follow up with PCP as previously  scheduled.   Discussed the importance of drinking 64 ounces of water daily, dehydration of red blood cells may lead further sickling.   Avoid all stressors that precipitate sickle cell pain crisis.     The patient was given clear instructions to go to ER or return to medical center if symptoms do not improve, worsen or new problems develop.     Physical Exam at Discharge:  BP 106/67 (BP Location: Left Arm)   Pulse 77   Temp 98.1 F (36.7 C) (Oral)   Resp 10   SpO2 92%  Physical Exam Constitutional:      Appearance: Normal appearance.  HENT:     Head: Normocephalic.     Nose: Nose normal.  Cardiovascular:     Rate and Rhythm: Normal rate and regular rhythm.     Pulses: Normal pulses.     Heart sounds: Normal heart sounds.  Pulmonary:     Effort: Pulmonary effort is normal.     Breath sounds: Normal breath sounds.  Skin:    General: Skin is warm.  Neurological:     General: No focal deficit present.     Mental Status: He is alert and oriented to person, place, and time. Mental status is at baseline.  Psychiatric:        Mood and Affect: Mood normal.        Behavior: Behavior normal.        Thought Content: Thought content normal.        Judgment: Judgment normal.      Disposition at Discharge: Discharge disposition: 01-Home or Self Care       Discharge Orders: Discharge Instructions    Discharge patient   Complete by: As directed    Discharge disposition: 01-Home or Self Care   Discharge patient date: 05/26/2019      Condition at Discharge:   Stable  Time spent on Discharge:  Greater than 30 minutes.  Signed: Donia Pounds  APRN, [name], [name] [facility] [address] [place], [place] [address] [phone]  05/27/2019, 10:47 AM

## 2019-06-06 ENCOUNTER — Other Ambulatory Visit: Payer: Self-pay | Admitting: Family Medicine

## 2019-06-06 ENCOUNTER — Telehealth: Payer: Self-pay | Admitting: Family Medicine

## 2019-06-06 DIAGNOSIS — G8929 Other chronic pain: Secondary | ICD-10-CM

## 2019-06-06 DIAGNOSIS — D571 Sickle-cell disease without crisis: Secondary | ICD-10-CM

## 2019-06-06 MED ORDER — OXYCODONE-ACETAMINOPHEN 10-325 MG PO TABS: 1.0000 | ORAL_TABLET | ORAL | 0 refills | Status: DC | PRN

## 2019-06-07 ENCOUNTER — Ambulatory Visit: Payer: Medicaid - Out of State | Admitting: Family Medicine

## 2019-06-07 NOTE — Telephone Encounter (Signed)
Done

## 2019-06-21 ENCOUNTER — Telehealth: Payer: Self-pay | Admitting: Family Medicine

## 2019-06-21 ENCOUNTER — Other Ambulatory Visit: Payer: Self-pay | Admitting: Family Medicine

## 2019-06-21 DIAGNOSIS — D571 Sickle-cell disease without crisis: Secondary | ICD-10-CM

## 2019-06-21 DIAGNOSIS — G8929 Other chronic pain: Secondary | ICD-10-CM

## 2019-06-21 MED ORDER — OXYCODONE-ACETAMINOPHEN 10-325 MG PO TABS: 1.0000 | ORAL_TABLET | ORAL | 0 refills | Status: DC | PRN

## 2019-06-21 MED ORDER — ABACAVIR-DOLUTEGRAVIR-LAMIVUD 600-50-300 MG PO TABS
1.00 | ORAL_TABLET | ORAL | Status: DC
Start: 2019-06-26 — End: 2019-06-21

## 2019-06-21 MED ORDER — METRISET BURETTE SET MISC
100.00 | Status: DC
Start: ? — End: 2019-06-21

## 2019-06-21 MED ORDER — POLYETHYLENE GLYCOL 3350 17 GM/SCOOP PO POWD
17.00 | ORAL | Status: DC
Start: 2019-06-27 — End: 2019-06-21

## 2019-06-21 MED ORDER — QUINTABS PO TABS
1.00 | ORAL_TABLET | ORAL | Status: DC
Start: 2019-06-27 — End: 2019-06-21

## 2019-06-21 MED ORDER — Medication
Status: DC
Start: ? — End: 2019-06-21

## 2019-06-21 MED ORDER — EQUATE NICOTINE 4 MG MT GUM
4.00 | CHEWING_GUM | OROMUCOSAL | Status: DC
Start: ? — End: 2019-06-21

## 2019-06-21 MED ORDER — APIXABAN 5 MG PO TABS
5.00 | ORAL_TABLET | ORAL | Status: DC
Start: 2019-06-26 — End: 2019-06-21

## 2019-06-21 MED ORDER — FOLIC ACID 1 MG PO TABS
2.00 | ORAL_TABLET | ORAL | Status: DC
Start: 2019-06-27 — End: 2019-06-21

## 2019-06-21 MED ORDER — SENNOSIDES-DOCUSATE SODIUM 8.6-50 MG PO TABS
1.00 | ORAL_TABLET | ORAL | Status: DC
Start: 2019-06-26 — End: 2019-06-21

## 2019-06-21 MED ORDER — LACTOBACILLUS ACID-PECTIN PO CAPS
1.00 | ORAL_CAPSULE | ORAL | Status: DC
Start: ? — End: 2019-06-21

## 2019-06-21 MED ORDER — HYDROXYUREA 500 MG PO CAPS
2000.00 | ORAL_CAPSULE | ORAL | Status: DC
Start: 2019-06-26 — End: 2019-06-21

## 2019-06-21 MED ORDER — OLANZAPINE-FLUOXETINE HCL 6-50 MG PO CAPS
3.00 | ORAL_CAPSULE | ORAL | Status: DC
Start: ? — End: 2019-06-21

## 2019-06-21 MED ORDER — GENERIC EXTERNAL MEDICATION
1.00 | Status: DC
Start: 2019-06-23 — End: 2019-06-21

## 2019-06-21 NOTE — Telephone Encounter (Signed)
Pt called in refill for Percocet

## 2019-06-23 ENCOUNTER — Other Ambulatory Visit: Payer: Self-pay | Admitting: Family Medicine

## 2019-06-23 MED ORDER — AZITHROMYCIN 250 MG PO TABS
500.00 | ORAL_TABLET | ORAL | Status: DC
Start: 2019-06-23 — End: 2019-06-23

## 2019-06-23 MED ORDER — IBUPROFEN 600 MG PO TABS
600.00 | ORAL_TABLET | ORAL | Status: DC
Start: 2019-06-26 — End: 2019-06-23

## 2019-06-23 MED ORDER — Medication
Status: DC
Start: ? — End: 2019-06-23

## 2019-06-23 MED ORDER — SB BRONCHIAL 12.5-200 MG PO TABS
25.00 | ORAL_TABLET | ORAL | Status: DC
Start: ? — End: 2019-06-23

## 2019-06-23 MED ORDER — CEFTRIAXONE SODIUM 1 G IJ SOLR
1.00 | INTRAMUSCULAR | Status: DC
Start: 2019-06-26 — End: 2019-06-23

## 2019-06-23 MED ORDER — CHOLECALCIFEROL 25 MCG (1000 UT) PO TABS
4000.00 | ORAL_TABLET | ORAL | Status: DC
Start: 2019-06-27 — End: 2019-06-23

## 2019-06-23 NOTE — Telephone Encounter (Signed)
Refill request for xanax. Please advise.  

## 2019-06-26 MED ORDER — Medication
Status: DC
Start: ? — End: 2019-06-26

## 2019-06-26 MED ORDER — Medication
10.00 | Status: DC
Start: ? — End: 2019-06-26

## 2019-06-26 MED ORDER — LACTOBACILLUS ACID-PECTIN PO CAPS
0.25 | ORAL_CAPSULE | ORAL | Status: DC
Start: ? — End: 2019-06-26

## 2019-06-26 MED ORDER — EPINEPHRINESNAP-V IJ
5.00 | INTRAMUSCULAR | Status: DC
Start: ? — End: 2019-06-26

## 2019-07-05 ENCOUNTER — Telehealth: Payer: Self-pay | Admitting: Family Medicine

## 2019-07-05 NOTE — Telephone Encounter (Signed)
Angie from [facility] ([employer]) needs last chart note. Fax #: [phone] and put attention angie. Her phone number is [phone].

## 2019-07-06 ENCOUNTER — Other Ambulatory Visit: Payer: Self-pay | Admitting: Family Medicine

## 2019-07-06 ENCOUNTER — Telehealth: Payer: Self-pay | Admitting: Family Medicine

## 2019-07-06 ENCOUNTER — Telehealth: Payer: Self-pay

## 2019-07-06 DIAGNOSIS — D571 Sickle-cell disease without crisis: Secondary | ICD-10-CM

## 2019-07-06 DIAGNOSIS — G8929 Other chronic pain: Secondary | ICD-10-CM

## 2019-07-06 MED ORDER — OXYCODONE-ACETAMINOPHEN 10-325 MG PO TABS: 1.0000 | ORAL_TABLET | ORAL | 0 refills | Status: DC | PRN

## 2019-07-06 NOTE — Telephone Encounter (Signed)
Completed.

## 2019-07-06 NOTE — Telephone Encounter (Signed)
Pt called in refill on percocet 

## 2019-07-20 ENCOUNTER — Telehealth: Payer: Self-pay | Admitting: Family Medicine

## 2019-07-20 NOTE — Telephone Encounter (Signed)
Pt called in refill on percocet and xanax

## 2019-07-23 ENCOUNTER — Other Ambulatory Visit: Payer: Self-pay | Admitting: Family Medicine

## 2019-07-23 DIAGNOSIS — G8929 Other chronic pain: Secondary | ICD-10-CM

## 2019-07-23 DIAGNOSIS — D571 Sickle-cell disease without crisis: Secondary | ICD-10-CM

## 2019-07-23 MED ORDER — LOVENOX 150 MG/ML ~~LOC~~ SOLN
1.00 | SUBCUTANEOUS | Status: DC
Start: ? — End: 2019-07-23

## 2019-07-23 MED ORDER — DIPHENHYDRAMINE HCL 50 MG/ML IJ SOLN
25.00 | INTRAMUSCULAR | Status: DC
Start: ? — End: 2019-07-23

## 2019-07-23 MED ORDER — OXYCODONE-ACETAMINOPHEN 10-325 MG PO TABS: 1.0000 | ORAL_TABLET | ORAL | 0 refills | Status: DC | PRN

## 2019-07-23 MED ORDER — CALCIUM-VITAMIN D-VITAMIN K 600-1000-90 MG-UNT-MCG PO TABS
100.00 | ORAL_TABLET | ORAL | Status: DC
Start: ? — End: 2019-07-23

## 2019-07-23 MED ORDER — ALPRAZOLAM 1 MG PO TABS: ORAL_TABLET | ORAL | 0 refills | Status: DC

## 2019-08-04 ENCOUNTER — Encounter (HOSPITAL_COMMUNITY): Payer: Self-pay | Admitting: Internal Medicine

## 2019-08-04 ENCOUNTER — Telehealth (HOSPITAL_COMMUNITY): Payer: Self-pay | Admitting: General Practice

## 2019-08-04 ENCOUNTER — Other Ambulatory Visit: Payer: Self-pay

## 2019-08-04 ENCOUNTER — Ambulatory Visit (INDEPENDENT_AMBULATORY_CARE_PROVIDER_SITE_OTHER): Payer: Medicaid Other | Admitting: Family Medicine

## 2019-08-04 ENCOUNTER — Inpatient Hospital Stay (HOSPITAL_COMMUNITY)
Admission: AD | Admit: 2019-08-04 | Discharge: 2019-08-09 | DRG: 812 | Disposition: A | Discharge status: Home / Self Care | Payer: Medicaid Other | Source: Ambulatory Visit | Attending: Internal Medicine | Admitting: Internal Medicine

## 2019-08-04 DIAGNOSIS — D5700 Hb-SS disease with crisis, unspecified: Secondary | ICD-10-CM | POA: Diagnosis not present

## 2019-08-04 DIAGNOSIS — F419 Anxiety disorder, unspecified: Secondary | ICD-10-CM | POA: Diagnosis present

## 2019-08-04 DIAGNOSIS — Z886 Allergy status to analgesic agent status: Secondary | ICD-10-CM | POA: Diagnosis not present

## 2019-08-04 DIAGNOSIS — R0989 Other specified symptoms and signs involving the circulatory and respiratory systems: Secondary | ICD-10-CM | POA: Diagnosis not present

## 2019-08-04 DIAGNOSIS — F411 Generalized anxiety disorder: Secondary | ICD-10-CM | POA: Diagnosis present

## 2019-08-04 DIAGNOSIS — J849 Interstitial pulmonary disease, unspecified: Secondary | ICD-10-CM | POA: Diagnosis present

## 2019-08-04 DIAGNOSIS — Z86711 Personal history of pulmonary embolism: Secondary | ICD-10-CM | POA: Diagnosis present

## 2019-08-04 DIAGNOSIS — Z7901 Long term (current) use of anticoagulants: Secondary | ICD-10-CM

## 2019-08-04 DIAGNOSIS — G8929 Other chronic pain: Secondary | ICD-10-CM

## 2019-08-04 DIAGNOSIS — Z09 Encounter for follow-up examination after completed treatment for conditions other than malignant neoplasm: Secondary | ICD-10-CM

## 2019-08-04 DIAGNOSIS — Z91048 Other nonmedicinal substance allergy status: Secondary | ICD-10-CM

## 2019-08-04 DIAGNOSIS — Z79899 Other long term (current) drug therapy: Secondary | ICD-10-CM

## 2019-08-04 DIAGNOSIS — K5900 Constipation, unspecified: Secondary | ICD-10-CM | POA: Diagnosis present

## 2019-08-04 DIAGNOSIS — Z79891 Long term (current) use of opiate analgesic: Secondary | ICD-10-CM | POA: Diagnosis not present

## 2019-08-04 DIAGNOSIS — B20 Human immunodeficiency virus [HIV] disease: Secondary | ICD-10-CM | POA: Diagnosis present

## 2019-08-04 DIAGNOSIS — Z20822 Contact with and (suspected) exposure to covid-19: Secondary | ICD-10-CM | POA: Diagnosis present

## 2019-08-04 DIAGNOSIS — J302 Other seasonal allergic rhinitis: Secondary | ICD-10-CM

## 2019-08-04 DIAGNOSIS — G894 Chronic pain syndrome: Secondary | ICD-10-CM | POA: Diagnosis present

## 2019-08-04 DIAGNOSIS — R0902 Hypoxemia: Secondary | ICD-10-CM

## 2019-08-04 DIAGNOSIS — F1190 Opioid use, unspecified, uncomplicated: Secondary | ICD-10-CM

## 2019-08-04 DIAGNOSIS — Z21 Asymptomatic human immunodeficiency virus [HIV] infection status: Secondary | ICD-10-CM | POA: Diagnosis present

## 2019-08-04 DIAGNOSIS — Z832 Family history of diseases of the blood and blood-forming organs and certain disorders involving the immune mechanism: Secondary | ICD-10-CM

## 2019-08-04 DIAGNOSIS — E559 Vitamin D deficiency, unspecified: Secondary | ICD-10-CM | POA: Diagnosis present

## 2019-08-04 LAB — RETICULOCYTES
Immature Retic Fract: 50.9 % — ABNORMAL HIGH (ref 2.3–15.9)
RBC.: 2.18 MIL/uL — ABNORMAL LOW (ref 4.22–5.81)
Retic Count, Absolute: 459.5 10*3/uL — ABNORMAL HIGH (ref 19.0–186.0)
Retic Ct Pct: 21.1 % — ABNORMAL HIGH (ref 0.4–3.1)

## 2019-08-04 LAB — CBC WITH DIFFERENTIAL/PLATELET
Abs Immature Granulocytes: 0.09 10*3/uL — ABNORMAL HIGH (ref 0.00–0.07)
Basophils Absolute: 0 10*3/uL (ref 0.0–0.1)
Basophils Relative: 0 %
Eosinophils Absolute: 0.1 10*3/uL (ref 0.0–0.5)
Eosinophils Relative: 1 %
HCT: 22.9 % — ABNORMAL LOW (ref 39.0–52.0)
Hemoglobin: 8.4 g/dL — ABNORMAL LOW (ref 13.0–17.0)
Immature Granulocytes: 1 %
Lymphocytes Relative: 42 %
Lymphs Abs: 5.2 10*3/uL — ABNORMAL HIGH (ref 0.7–4.0)
MCH: 37.3 pg — ABNORMAL HIGH (ref 26.0–34.0)
MCHC: 36.7 g/dL — ABNORMAL HIGH (ref 30.0–36.0)
MCV: 101.8 fL — ABNORMAL HIGH (ref 80.0–100.0)
Monocytes Absolute: 1.2 10*3/uL — ABNORMAL HIGH (ref 0.1–1.0)
Monocytes Relative: 10 %
Neutro Abs: 5.7 10*3/uL (ref 1.7–7.7)
Neutrophils Relative %: 46 %
Platelets: 355 10*3/uL (ref 150–400)
RBC: 2.25 MIL/uL — ABNORMAL LOW (ref 4.22–5.81)
RDW: 24.9 % — ABNORMAL HIGH (ref 11.5–15.5)
WBC: 12.3 10*3/uL — ABNORMAL HIGH (ref 4.0–10.5)
nRBC: 19 % — ABNORMAL HIGH (ref 0.0–0.2)

## 2019-08-04 LAB — COMPREHENSIVE METABOLIC PANEL
ALT: 25 U/L (ref 0–44)
AST: 53 U/L — ABNORMAL HIGH (ref 15–41)
Albumin: 3.7 g/dL (ref 3.5–5.0)
Alkaline Phosphatase: 60 U/L (ref 38–126)
Anion gap: 8 (ref 5–15)
BUN: 9 mg/dL (ref 6–20)
CO2: 23 mmol/L (ref 22–32)
Calcium: 8.7 mg/dL — ABNORMAL LOW (ref 8.9–10.3)
Chloride: 105 mmol/L (ref 98–111)
Creatinine, Ser: 0.6 mg/dL — ABNORMAL LOW (ref 0.61–1.24)
GFR calc Af Amer: >60 mL/min (ref >60)
GFR calc non Af Amer: >60 mL/min (ref >60)
Glucose, Bld: 85 mg/dL (ref 70–99)
Potassium: 4.6 mmol/L (ref 3.5–5.1)
Sodium: 136 mmol/L (ref 135–145)
Total Bilirubin: 10.1 mg/dL — ABNORMAL HIGH (ref 0.3–1.2)
Total Protein: 9.4 g/dL — ABNORMAL HIGH (ref 6.5–8.1)

## 2019-08-04 LAB — RESPIRATORY PANEL BY RT PCR (FLU A&B, COVID)
Influenza A by PCR: NEGATIVE
Influenza B by PCR: NEGATIVE
SARS Coronavirus 2 by RT PCR: NEGATIVE

## 2019-08-04 MED ORDER — CETIRIZINE HCL 10 MG PO TABS: 10.0000 mg | ORAL_TABLET | Freq: Every day | ORAL | 11 refills | Status: DC

## 2019-08-04 MED ORDER — SENNOSIDES-DOCUSATE SODIUM 8.6-50 MG PO TABS: 1.0000 | ORAL_TABLET | Freq: Two times a day (BID) | ORAL | Status: DC

## 2019-08-04 MED ORDER — APIXABAN 5 MG PO TABS: 5.0000 mg | ORAL_TABLET | Freq: Two times a day (BID) | ORAL | Status: DC

## 2019-08-04 MED ORDER — DIPHENHYDRAMINE HCL 25 MG PO CAPS
25.0000 mg | ORAL_CAPSULE | ORAL | Status: DC | PRN
  Administered 2019-08-04 – 2019-08-07 (×2): 25 mg via ORAL
  Filled 2019-08-04 (×3): qty 1

## 2019-08-04 MED ORDER — HYDROXYUREA 500 MG PO CAPS
2000.0000 mg | ORAL_CAPSULE | Freq: Every day | ORAL | Status: DC
  Administered 2019-08-04 – 2019-08-08 (×5): 2000 mg via ORAL
  Filled 2019-08-04 (×6): qty 4

## 2019-08-04 MED ORDER — ABACAVIR-DOLUTEGRAVIR-LAMIVUD 600-50-300 MG PO TABS
1.0000 | ORAL_TABLET | Freq: Every day | ORAL | Status: DC
  Administered 2019-08-04 – 2019-08-08 (×5): 1 via ORAL
  Filled 2019-08-04 (×7): qty 1

## 2019-08-04 MED ORDER — HYDROMORPHONE 1 MG/ML IV SOLN
INTRAVENOUS | Status: DC
  Administered 2019-08-04 (×2): 30 mg via INTRAVENOUS
  Administered 2019-08-04: 17.2 mg via INTRAVENOUS
  Administered 2019-08-04: 2.4 mg via INTRAVENOUS
  Administered 2019-08-05: 5.4 mg via INTRAVENOUS
  Administered 2019-08-05: 4.99 mg via INTRAVENOUS
  Administered 2019-08-05: 30 mg via INTRAVENOUS
  Administered 2019-08-05: 7.8 mg via INTRAVENOUS
  Administered 2019-08-05: 7.3 mg via INTRAVENOUS
  Administered 2019-08-05: 6 mg via INTRAVENOUS
  Administered 2019-08-06 (×2): 30 mg via INTRAVENOUS
  Administered 2019-08-06: 10.8 mg via INTRAVENOUS
  Administered 2019-08-06: 4.8 mg via INTRAVENOUS
  Administered 2019-08-06: 4.59 mg via INTRAVENOUS
  Administered 2019-08-06: 5.4 mg via INTRAVENOUS
  Administered 2019-08-06: 10.8 mg via INTRAVENOUS
  Administered 2019-08-07: 6 mg via INTRAVENOUS
  Administered 2019-08-07: 2.4 mg via INTRAVENOUS
  Administered 2019-08-07: 3 mg via INTRAVENOUS
  Administered 2019-08-07: 7.2 mg via INTRAVENOUS
  Administered 2019-08-07: 3.6 mg via INTRAVENOUS
  Administered 2019-08-07: 30 mg via INTRAVENOUS
  Administered 2019-08-07: 8.4 mg via INTRAVENOUS
  Administered 2019-08-08: 30 mg via INTRAVENOUS
  Administered 2019-08-08: 12 mg via INTRAVENOUS
  Administered 2019-08-08: 1.8 mg via INTRAVENOUS
  Filled 2019-08-04 (×7): qty 30

## 2019-08-04 MED ORDER — POLYETHYLENE GLYCOL 3350 17 G PO PACK
17.0000 g | PACK | Freq: Every day | ORAL | Status: DC | PRN
  Administered 2019-08-05: 17 g via ORAL
  Filled 2019-08-04: qty 1

## 2019-08-04 MED ORDER — ONDANSETRON HCL 4 MG/2ML IJ SOLN
4.0000 mg | Freq: Four times a day (QID) | INTRAMUSCULAR | Status: DC | PRN
  Administered 2019-08-05 – 2019-08-06 (×2): 4 mg via INTRAVENOUS
  Filled 2019-08-04 (×2): qty 2

## 2019-08-04 MED ORDER — ENOXAPARIN SODIUM 40 MG/0.4ML ~~LOC~~ SOLN
40.0000 mg | SUBCUTANEOUS | Status: DC
  Filled 2019-08-04 (×4): qty 0.4

## 2019-08-04 MED ORDER — POLYETHYLENE GLYCOL 3350 17 G PO PACK: 17.0000 g | PACK | Freq: Every day | ORAL | Status: DC | PRN

## 2019-08-04 MED ORDER — SODIUM CHLORIDE 0.9% FLUSH: 9.0000 mL | INTRAVENOUS | Status: DC | PRN

## 2019-08-04 MED ORDER — SENNOSIDES-DOCUSATE SODIUM 8.6-50 MG PO TABS
1.0000 | ORAL_TABLET | Freq: Two times a day (BID) | ORAL | Status: DC
  Administered 2019-08-04 – 2019-08-07 (×7): 1 via ORAL
  Filled 2019-08-04 (×10): qty 1

## 2019-08-04 MED ORDER — SODIUM CHLORIDE 0.45 % IV SOLN
INTRAVENOUS | Status: DC
  Administered 2019-08-04: 150 mL/h via INTRAVENOUS

## 2019-08-04 MED ORDER — NALOXONE HCL 0.4 MG/ML IJ SOLN: 0.4000 mg | INTRAMUSCULAR | Status: DC | PRN

## 2019-08-04 MED ORDER — ALPRAZOLAM 1 MG PO TABS: 1.0000 mg | ORAL_TABLET | Freq: Two times a day (BID) | ORAL | Status: DC | PRN

## 2019-08-04 MED ORDER — SODIUM CHLORIDE 0.9 % IV SOLN
25.0000 mg | INTRAVENOUS | Status: DC | PRN
  Administered 2019-08-04 – 2019-08-07 (×6): 25 mg via INTRAVENOUS
  Filled 2019-08-04: qty 0.5
  Filled 2019-08-04 (×6): qty 25

## 2019-08-04 NOTE — Progress Notes (Signed)
Patient admitted to the day hospital for treatment of sickle cell pain crisis. Patient reported generalized pain especially in the back rated 10/10.  Patient placed on Dilaudid PCA, given a PO Benadryl and hydrated with IV fluids. Patient transferred to [facility] 3 east room 03. Report given to Amy RN. Patient alert, oriented and transported in a wheel chair.

## 2019-08-04 NOTE — H&P (Signed)
[facility] History and Physical  Kishaun KALONJI ZURAWSKI PXT:[id number] DOB: [birth year] DOA: 08/04/2019  PCP: [name], [name]   Chief Complaint: Sickle cell pain  HPI: Martin [name] is a 29 y.o. male with history of sickle cell disease, chronic pain syndrome, opiate dependence and tolerance, HIV disease on antiretroviral, history of PE on apixaban who presented to the day hospital today with major complaints of generalized body pain that is typical of his pain crisis. Patient states the pain intensity slowly increased from last night until he could not take it anymore even after taking his home pain medications, there was no sustained relief. He rates his pain at 10/10, constant and throbbing. No joint swelling or redness. He denies any fever, cough, shortness of breath, chest pain, nausea, vomiting or diarrhea.  He denies any urinary symptoms.  He denies any sick contact, recent travel or exposure to COVID-19.  Systemic Review: General: The patient denies anorexia, fever, weight loss Cardiac: Denies chest pain, syncope, palpitations, pedal edema  Respiratory: Denies cough, shortness of breath, wheezing GI: Denies severe indigestion/heartburn, abdominal pain, nausea, vomiting, diarrhea and constipation GU: Denies hematuria, incontinence, dysuria  Musculoskeletal: Denies arthritis  Skin: Denies suspicious skin lesions Neurologic: Denies focal weakness or numbness, change in vision  Past Medical History:  Diagnosis Date  . Anxiety   . HIV (human immunodeficiency virus infection) (HCC)   . Sickle cell crisis (HCC)   . 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HCC)   . Vitamin D deficiency 10/2018    No past surgical history on file.  Allergies  Allergen Reactions  . Tape Rash and Other (See Comments)    PLEASE DO NOT USE THE CLEAR, THICK, "PLASTIC" TAPE- only paper tape is tolerated   . Ketorolac Swelling and Other (See Comments)    Patient reports facial edema and left arm edema  after administration.    Family History  Problem Relation Age of Onset  . Sickle cell trait Mother   . Sickle cell trait Father   . Birth defects Maternal Grandmother   . Birth defects Paternal Grandmother       Prior to Admission medications   Medication Sig Start Date End Date Taking? Authorizing Provider  abacavir-dolutegravir-lamiVUDine (TRIUMEQ) 600-50-300 MG tablet Take 1 tablet by mouth at bedtime.     [provider]  ALPRAZolam Prudy Feeler) 1 MG tablet Take 1 tablet by mouth 3 times daily as needed for anxiety or sleep. 07/26/19 08/25/19  [name], [name]  apixaban (ELIQUIS) 5 MG TABS tablet Take 1 tablet (5 mg total) by mouth 2 (two) times daily. 02/16/19   Massie Maroon, [name]  diphenhydrAMINE (BENADRYL) 25 MG tablet Take 25 mg by mouth every 6 (six) hours as needed for allergies.    [provider]  hydroxyurea (HYDREA) 500 MG capsule Take 4 capsules (2,000 mg total) by mouth at bedtime. 05/26/19   [name], [name]  Multiple Vitamin (MULTIVITAMIN WITH MINERALS) TABS tablet Take 1 tablet by mouth daily with breakfast.     [provider]  naloxone (NARCAN) 0.4 MG/ML injection As needed. Patient taking differently: Inject 0.4 mg into the muscle daily as needed (overdose).  11/28/18   [name], [name]  oxyCODONE-acetaminophen (PERCOCET) 10-325 MG tablet Take 1 tablet by mouth every 4 (four) hours as needed for up to 15 days for pain. 07/25/19 08/09/19  [name], [name]  Vitamin D, Ergocalciferol, (DRISDOL) 1.25 MG (50000 UT) CAPS capsule Take 1 capsule (50,000 Units  total) by mouth every 7 (seven) days. 11/01/18   Azzie Glatter, [name]     Physical Exam: There were no vitals filed for this visit.  General: Alert, awake, afebrile, anicteric, not in obvious distress HEENT: Normocephalic and Atraumatic, Mucous membranes pink                PERRLA; EOM intact; No scleral icterus,                 Nares: Patent, Oropharynx: Clear, Fair  Dentition                 Neck: FROM, no cervical lymphadenopathy, thyromegaly, carotid bruit or JVD;  CHEST WALL: No tenderness  CHEST: Normal respiration, clear to auscultation bilaterally  HEART: Regular rate and rhythm; no murmurs rubs or gallops  BACK: No kyphosis or scoliosis; no CVA tenderness  ABDOMEN: Positive Bowel Sounds, soft, non-tender; no masses, no organomegaly EXTREMITIES: No cyanosis, clubbing, or edema SKIN:  no rash or ulceration  CNS: Alert and Oriented x 4, Nonfocal exam, CN 2-12 intact  Labs on Admission:  Basic Metabolic Panel: No results for input(s): NA, K, CL, CO2, GLUCOSE, BUN, CREATININE, CALCIUM, MG, PHOS in the last 168 hours. Liver Function Tests: No results for input(s): AST, ALT, ALKPHOS, BILITOT, PROT, ALBUMIN in the last 168 hours. No results for input(s): LIPASE, AMYLASE in the last 168 hours. No results for input(s): AMMONIA in the last 168 hours. CBC: No results for input(s): WBC, NEUTROABS, HGB, HCT, MCV, PLT in the last 168 hours. Cardiac Enzymes: No results for input(s): CKTOTAL, CKMB, CKMBINDEX, TROPONINI in the last 168 hours.  BNP (last 3 results) No results for input(s): BNP in the last 8760 hours.  ProBNP (last 3 results) No results for input(s): PROBNP in the last 8760 hours.  CBG: No results for input(s): GLUCAP in the last 168 hours.   Assessment/Plan Active Problems:   Sickle cell anemia with pain (HCC)   Admits to the [facility]  IVF .45% Saline @ 150 mls/hour  Weight based Dilaudid PCA started within 30 minutes of admission  IV Toradol is contraindicated due to allergies  Monitor vitals very closely, Re-evaluate pain scale every hour  2 L of Oxygen by [place]  Patient will be re-evaluated for pain in the context of function and relationship to baseline as care progresses.  If no significant relieve from pain (remains above 5/10) will transfer patient to inpatient services for further evaluation and  management  Code Status: Full  Family Communication: None  DVT Prophylaxis: Ambulate as tolerated   Time spent: 35 Minutes  Angelica Chessman, MD, MHA, FACP, FAAP, CPE  If 7PM-7AM, please contact night-coverage [email or web address] 08/04/2019, 10:24 AM

## 2019-08-04 NOTE — Telephone Encounter (Signed)
Patient called, requesting to come to the day hospital due to generalized pain rated at 10/10. Denied chest pain, fever, diarrhea, abdominal pain, nausea/vomitting and priapism. Screened negative for Covid-19 symptoms. Admitted to having means of transportation without driving self after treatment. Last took "10 mg of Percocet" at 06;00 am today. Per provider, patient can come to the day hospital for treatment. Patient notified, verbalized understanding. 

## 2019-08-04 NOTE — H&P (Signed)
H&P  Patient Demographics:  Martin [name], is a 29 y.o. male  MRN: [id number]   DOB - 12-06-1990  Admit Date - 08/04/2019  Outpatient Primary MD for the patient is Azzie Glatter, [name]   HPI:   Martin [name]  is a 29 y.o. male with history of sickle cell disease, chronic pain syndrome, opiate dependence and tolerance, HIV disease on antiretroviral, history of PE on apixaban who presented to the day hospital today with major complaints of generalized body pain that is typical of his pain crisis. Patient states the pain intensity slowly increased from last night until he could not take it anymore even after taking his home pain medications, there was no sustained relief. He rates his pain at 10/10, constant and throbbing. No joint swelling or redness. He denies any fever, cough, shortness of breath, chest pain, nausea, vomiting or diarrhea.  He denies any urinary symptoms.  He denies any sick contact, recent travel or exposure to COVID-19.  Day Hospital Course: Patient was admitted to the day hospital and treated with standard protocol of IV fluid, IV Dilaudid via PCA and other adjunct therapies.  IV Toradol is contraindicated due to allergy. His laboratory investigations were within normal limits, hemoglobin is stable at baseline.  Patient did not get better despite the above regimen, he will therefore be admitted to the hospital for further evaluation and continued pain management.  Review of systems:  In addition to the HPI above, patient reports No fever or chills No Headache, No changes with vision or hearing No problems swallowing food or liquids No chest pain, cough or shortness of breath No Abdominal pain, No Nausea or Vomiting, Bowel movements are regular No blood in stool or urine No dysuria No new skin rashes or bruises No new joints pains-aches No new weakness, tingling, numbness in any extremity No recent weight gain or loss No polyuria, polydypsia or polyphagia No  significant Mental Stressors  A full 10 point Review of Systems was done, except as stated above, all other Review of Systems were negative.  With Past History of the following :   Past Medical History:  Diagnosis Date  . Anxiety   . HIV (human immunodeficiency virus infection) ([place])   . Sickle cell crisis ([place])   . 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[place])   . Vitamin D deficiency 10/2018      History reviewed. No pertinent surgical history.   Social History:   Social History   Tobacco Use  . Smoking status: Never Smoker  . Smokeless tobacco: Never Used  Substance Use Topics  . Alcohol use: No     Lives - At home   Family History :   Family History  Problem Relation Age of Onset  . Sickle cell trait Mother   . Sickle cell trait Father   . Birth defects Maternal Grandmother   . Birth defects Paternal Grandmother      Home Medications:   Prior to Admission medications   Medication Sig Start Date End Date Taking? Authorizing Provider  abacavir-dolutegravir-lamiVUDine (TRIUMEQ) 600-50-300 MG tablet Take 1 tablet by mouth at bedtime.    Yes [provider]  ALPRAZolam Duanne Moron) 1 MG tablet Take 1 tablet by mouth 3 times daily as needed for anxiety or sleep. 07/26/19 08/25/19 Yes Azzie Glatter, [name]  apixaban (ELIQUIS) 5 MG TABS tablet Take 1 tablet (5 mg total) by mouth 2 (two) times daily. 02/16/19  Yes [name], [name]  diphenhydrAMINE (BENADRYL) 25 MG tablet Take  25 mg by mouth every 6 (six) hours as needed for allergies.   Yes [provider]  Multiple Vitamin (MULTIVITAMIN WITH MINERALS) TABS tablet Take 1 tablet by mouth daily with breakfast.    Yes [provider]  oxyCODONE-acetaminophen (PERCOCET) 10-325 MG tablet Take 1 tablet by mouth every 4 (four) hours as needed for up to 15 days for pain. 07/25/19 08/09/19 Yes [name], [name]  Vitamin D, Ergocalciferol, (DRISDOL) 1.25 MG (50000 UT) CAPS capsule Take 1 capsule (50,000 Units total) by mouth  every 7 (seven) days. 11/01/18  Yes [name], [name]  cetirizine (ZYRTEC) 10 MG tablet Take 1 tablet (10 mg total) by mouth daily. 08/04/19   [name], [name]  hydroxyurea (HYDREA) 500 MG capsule Take 4 capsules (2,000 mg total) by mouth at bedtime. 05/26/19   [name], [name]  naloxone [facility]) 0.4 MG/ML injection As needed. Patient taking differently: Inject 0.4 mg into the muscle daily as needed (overdose).  11/28/18   [name], [name]     Allergies:   Allergies  Allergen Reactions  . Tape Rash and Other (See Comments)    PLEASE DO NOT USE THE CLEAR, THICK, "PLASTIC" TAPE- only paper tape is tolerated   . Ketorolac Swelling and Other (See Comments)    Patient reports facial edema and left arm edema after administration.     Physical Exam:   Vitals:   Vitals:   08/04/19 1612 08/04/19 1630  BP: (!) 98/58 114/70  Pulse: 83 72  Resp: 11 16  Temp: 98.1 F (36.7 C) 99.1 F (37.3 C)  SpO2: 92% 92%    Physical Exam: Constitutional: Patient appears well-developed and well-nourished. Not in obvious distress. HENT: Normocephalic, atraumatic, External right and left ear normal. Oropharynx is clear and moist.  Eyes: Conjunctivae and EOM are normal. PERRLA, no scleral icterus. Neck: Normal ROM. Neck supple. No JVD. No tracheal deviation. No thyromegaly. CVS: RRR, S1/S2 +, no murmurs, no gallops, no carotid bruit.  Pulmonary: Effort and breath sounds normal, no stridor, rhonchi, wheezes, rales.  Abdominal: Soft. BS +, no distension, tenderness, rebound or guarding.  Musculoskeletal: Normal range of motion. No edema and no tenderness.  Lymphadenopathy: No lymphadenopathy noted, cervical, inguinal or axillary Neuro: Alert. Normal reflexes, muscle tone coordination. No cranial nerve deficit. Skin: Skin is warm and dry. No rash noted. Not diaphoretic. No erythema. No pallor. Psychiatric: Normal mood and affect. Behavior, judgment, thought content normal.   Data  Review:   CBC Recent Labs  Lab 08/04/19 1023  WBC 12.3*  HGB 8.4*  HCT 22.9*  PLT 355  MCV 101.8*  MCH 37.3*  MCHC 36.7*  RDW 24.9*  LYMPHSABS 5.2*  MONOABS 1.2*  EOSABS 0.1  BASOSABS 0.0   ------------------------------------------------------------------------------------------------------------------  Chemistries  Recent Labs  Lab 08/04/19 1023  NA 136  K 4.6  CL 105  CO2 23  GLUCOSE 85  BUN 9  CREATININE 0.60*  CALCIUM 8.7*  AST 53*  ALT 25  ALKPHOS 60  BILITOT 10.1*   ------------------------------------------------------------------------------------------------------------------ estimated creatinine clearance is 131 mL/min (A) (by C-G formula based on SCr of 0.6 mg/dL (L)). ------------------------------------------------------------------------------------------------------------------ No results for input(s): TSH, T4TOTAL, T3FREE, THYROIDAB in the last 72 hours.  Invalid input(s): FREET3  Coagulation profile No results for input(s): INR, PROTIME in the last 168 hours. ------------------------------------------------------------------------------------------------------------------- No results for input(s): DDIMER in the last 72 hours. -------------------------------------------------------------------------------------------------------------------  Cardiac Enzymes No results for input(s): CKMB, TROPONINI, MYOGLOBIN in the last 168 hours.  Invalid input(s): CK ------------------------------------------------------------------------------------------------------------------ No results found for: BNP  ---------------------------------------------------------------------------------------------------------------  Urinalysis    Component Value Date/Time   COLORURINE AMBER (A) 03/26/2019 0128   APPEARANCEUR CLEAR 03/26/2019 0128   LABSPEC 1.014 03/26/2019 0128   PHURINE 6.0 03/26/2019 0128   GLUCOSEU NEGATIVE 03/26/2019 0128   HGBUR SMALL (A)  03/26/2019 0128   BILIRUBINUR NEGATIVE 03/26/2019 0128   BILIRUBINUR Negative 03/24/2019 1410   KETONESUR NEGATIVE 03/26/2019 0128   PROTEINUR NEGATIVE 03/26/2019 0128   UROBILINOGEN 2.0 (A) 03/24/2019 1410   UROBILINOGEN 4.0 (H) 05/21/2009 1944   NITRITE NEGATIVE 03/26/2019 0128   LEUKOCYTESUR NEGATIVE 03/26/2019 0128    ----------------------------------------------------------------------------------------------------------------   Imaging Results:    Assessment & Plan:  Principal Problem:   Sickle cell anemia with crisis (HCC) Active Problems:   Generalized anxiety disorder   HIV (human immunodeficiency virus infection) (HCC)   Chronic pain syndrome   History of pulmonary embolus (PE)  1. Hb Sickle Cell Disease with crisis: Admit, start IVF .45% Saline @ 150 mls/hour, Weight based Dilaudid PCA started within 30 minutes of admission, IV Toradol is contraindicated because of allergy. Restart and continue home pain medications. Monitor vitals very closely, Re-evaluate pain scale regularly, 2 L of Oxygen by [place], Patient will be re-evaluated for pain in the context of function and relationship to baseline as care progresses. 2. Leukocytosis: Mild. No evidence of infection or inflammation, will continue to monitor 3. Sickle Cell Anemia: Hb is at baseline. No clinical indication for blood transfusion today. Repeat labs in am 4. Chronic pain Syndrome: Continue home medications 5. HIV Infection: Stable. Continue  6. Generalized anxiety disorder: Continue home medications.  Patient denies any suicidal or homicidal ideations.  Patient counseled extensively. 7. History of pulmonary embolism: Patient no longer on Eliquis.  Will place patient on Lovenox for DVT prophylaxis.  DVT Prophylaxis: Subcut Lovenox   AM Labs Ordered, also please review Full Orders  Family Communication: Admission, patient's condition and plan of care including tests being ordered have been discussed with the patient  who indicate understanding and agree with the plan and Code Status.  Code Status: Full Code  Consults called: None    Admission status: Inpatient    Time spent in minutes : 50 minutes  Jeanann Lewandowsky MD, MHA, CPE, FACP 08/04/2019 at 5:39 PM

## 2019-08-04 NOTE — Progress Notes (Signed)
[facility]  Established Patient Office Visit  Subjective:  Patient ID: Martin [name], male    DOB: 18-Oct-1990  Age: 29 y.o. MRN: [id number]  CC: No chief complaint on file.   HPI Martin [name] is a 29 year old male who presents for Follow Up today.   Past Medical History:  Diagnosis Date  . Anxiety   . HIV (human immunodeficiency virus infection) (HCC)   . Sickle cell crisis (HCC)   . 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HCC)   . Vitamin D deficiency 10/2018   Current Status: Since his last office visit, he is doing well with no complaints. He states that he has pain in his hips, back, and legs. He rates his pain today at 7-8/10. He has not had a hospital visit for Sickle Cell Crisis since 07/22/2019 where he was treated and discharged the same day. He is currently taking all medications as prescribed and staying well hydrated. He reports occasional nausea, constipation, dizziness and headaches. He continues to follow up with Infection Disease as needed. He is currently on 2 liters oxygen QHS. He states that when ambulating oxygen drops to 89% and 91% when at rest. He denies fevers, chills, fatigue, recent infections, weight loss, and night sweats. He has not had any headaches, visual changes, dizziness, and falls. No chest pain, heart palpitations, cough and shortness of breath reported. Denies GI problems such as nausea, vomiting, diarrhea, and constipation. He has no reports of blood in stools, dysuria and hematuria. No depression or anxiety, and denies suicidal ideations, homicidal ideations, or auditory hallucinations. He is taking all medications as prescribed. He denies pain today.   No past surgical history on file.  Family History  Problem Relation Age of Onset  . Sickle cell trait Mother   . Sickle cell trait Father   . Birth defects Maternal Grandmother   . Birth defects Paternal Grandmother     Social History   Socioeconomic  History  . Marital status: Single    Spouse name: Not on file  . Number of children: Not on file  . Years of education: Not on file  . Highest education level: Not on file  Occupational History  . Not on file  Tobacco Use  . Smoking status: Never Smoker  . Smokeless tobacco: Never Used  Substance and Sexual Activity  . Alcohol use: No  . Drug use: No  . Sexual activity: Yes    Birth control/protection: Condom  Other Topics Concern  . Not on file  Social History Narrative  . Not on file   Social Determinants of Health   Financial Resource Strain:   . Difficulty of Paying Living Expenses:   Food Insecurity:   . Worried About Programme researcher, broadcasting/film/video in the Last Year:   . Barista in the Last Year:   Transportation Needs:   . Freight forwarder (Medical):   Marland Kitchen Lack of Transportation (Non-Medical):   Physical Activity:   . Days of Exercise per Week:   . Minutes of Exercise per Session:   Stress:   . Feeling of Stress :   Social Connections:   . Frequency of Communication with Friends and Family:   . Frequency of Social Gatherings with Friends and Family:   . Attends Religious Services:   . Active Member of Clubs or Organizations:   . Attends Banker Meetings:   Marland Kitchen Marital Status:   Intimate Programme researcher, broadcasting/film/video  Violence:   . Fear of Current or Ex-Partner:   . Emotionally Abused:   Marland Kitchen Physically Abused:   . Sexually Abused:     Facility-Administered Medications Prior to Visit  Medication Dose Route Frequency Provider Last Rate Last Admin  . 0.45 % sodium chloride infusion   Intravenous Continuous Quentin Angst, MD 75 mL/hr at 08/05/19 1314 Rate Change at 08/05/19 1314  . diphenhydrAMINE (BENADRYL) capsule 25 mg  25 mg Oral Q4H PRN Jeanann Lewandowsky E, MD   25 mg at 08/04/19 1210   Or  . diphenhydrAMINE (BENADRYL) 25 mg in sodium chloride 0.9 % 50 mL IVPB  25 mg Intravenous Q4H PRN Jeanann Lewandowsky E, MD 100 mL/hr at 08/04/19 2215 25 mg at 08/04/19 2215    . HYDROmorphone (DILAUDID) 1 mg/mL PCA injection   Intravenous Q4H Jegede, Olugbemiga E, MD   30 mg at 08/05/19 1314  . naloxone (NARCAN) injection 0.4 mg  0.4 mg Intravenous PRN Jegede, Olugbemiga E, MD       And  . sodium chloride flush (NS) 0.9 % injection 9 mL  9 mL Intravenous PRN Jegede, Olugbemiga E, MD      . ondansetron (ZOFRAN) injection 4 mg  4 mg Intravenous Q6H PRN Quentin Angst, MD   4 mg at 08/05/19 1803  . polyethylene glycol (MIRALAX / GLYCOLAX) packet 17 g  17 g Oral Daily PRN Jegede, Olugbemiga E, MD      . senna-docusate (Senokot-S) tablet 1 tablet  1 tablet Oral BID Quentin Angst, MD       Outpatient Medications Prior to Visit  Medication Sig Dispense Refill  . abacavir-dolutegravir-lamiVUDine (TRIUMEQ) 600-50-300 MG tablet Take 1 tablet by mouth at bedtime.     . ALPRAZolam (XANAX) 1 MG tablet Take 1 tablet by mouth 3 times daily as needed for anxiety or sleep. 30 tablet 0  . apixaban (ELIQUIS) 5 MG TABS tablet Take 1 tablet (5 mg total) by mouth 2 (two) times daily. (Patient not taking: Reported on 08/04/2019) 60 tablet 5  . diphenhydrAMINE (BENADRYL) 25 MG tablet Take 25 mg by mouth every 6 (six) hours as needed for allergies.    . hydroxyurea (HYDREA) 500 MG capsule Take 4 capsules (2,000 mg total) by mouth at bedtime. 120 capsule 3  . Multiple Vitamin (MULTIVITAMIN WITH MINERALS) TABS tablet Take 1 tablet by mouth daily with breakfast.     . oxyCODONE-acetaminophen (PERCOCET) 10-325 MG tablet Take 1 tablet by mouth every 4 (four) hours as needed for up to 15 days for pain. 90 tablet 0  . Vitamin D, Ergocalciferol, (DRISDOL) 1.25 MG (50000 UT) CAPS capsule Take 1 capsule (50,000 Units total) by mouth every 7 (seven) days. (Patient not taking: Reported on 08/04/2019) 5 capsule 3  . naloxone (NARCAN) 0.4 MG/ML injection As needed. (Patient taking differently: Inject 0.4 mg into the muscle daily as needed (overdose). ) 1 mL 2    Allergies  Allergen Reactions   . Tape Rash and Other (See Comments)    PLEASE DO NOT USE THE CLEAR, THICK, "PLASTIC" TAPE- only paper tape is tolerated   . Ketorolac Swelling and Other (See Comments)    Patient reports facial edema and left arm edema after administration.    ROS Review of Systems  Constitutional: Negative.   HENT: Negative.   Eyes: Negative.   Respiratory: Negative.   Cardiovascular: Negative.   Gastrointestinal: Positive for constipation (occasional ) and nausea (occasional ).  Endocrine: Negative.   Genitourinary: Negative.  Musculoskeletal: Positive for arthralgias (generalized joint pain).  Skin: Negative.   Allergic/Immunologic: Negative.   Neurological: Positive for dizziness (occasional ) and headaches (occasional ).  Hematological: Negative.   Psychiatric/Behavioral: Negative.       Objective:    Physical Exam  Constitutional: He is oriented to person, place, and time. He appears well-developed and well-nourished.  HENT:  Head: Normocephalic and atraumatic.  Eyes: Conjunctivae are normal.  Cardiovascular: Normal rate, regular rhythm, normal heart sounds and intact distal pulses.  Pulmonary/Chest: Effort normal and breath sounds normal.  Abdominal: Soft. Bowel sounds are normal.  Musculoskeletal:        General: Normal range of motion.     Cervical back: Normal range of motion and neck supple.  Neurological: He is alert and oriented to person, place, and time. He has normal reflexes.  Skin: Skin is warm and dry.  Psychiatric: He has a normal mood and affect. His behavior is normal. Judgment and thought content normal.  Nursing note and vitals reviewed.   There were no vitals taken for this visit. Wt Readings from Last 3 Encounters:  08/04/19 149 lb 14.6 oz (68 kg)  05/26/19 150 lb (68 kg)  04/23/19 150 lb 12.7 oz (68.4 kg)     There are no preventive care reminders to display for this patient.  There are no preventive care reminders to display for this patient.  No  results found for: TSH Lab Results  Component Value Date   WBC 12.3 (H) 08/04/2019   HGB 8.4 (L) 08/04/2019   HCT 22.9 (L) 08/04/2019   MCV 101.8 (H) 08/04/2019   PLT 355 08/04/2019   Lab Results  Component Value Date   NA 136 08/04/2019   K 4.6 08/04/2019   CO2 23 08/04/2019   GLUCOSE 85 08/04/2019   BUN 9 08/04/2019   CREATININE 0.60 (L) 08/04/2019   BILITOT 10.1 (H) 08/04/2019   ALKPHOS 60 08/04/2019   AST 53 (H) 08/04/2019   ALT 25 08/04/2019   PROT 9.4 (H) 08/04/2019   ALBUMIN 3.7 08/04/2019   CALCIUM 8.7 (L) 08/04/2019   ANIONGAP 8 08/04/2019   No results found for: CHOL No results found for: HDL No results found for: LDLCALC No results found for: [name] No results found for: CHOLHDL Lab Results  Component Value Date   HGBA1C (L) 05/22/2009    <4.0 (NOTE) The ADA recommends the following therapeutic goal for glycemic control related to Hgb A1c measurement: Goal of therapy: <6.5 Hgb A1c  Reference: American Diabetes Association: Clinical Practice Recommendations 2010, Diabetes Care, 2010, 33: (Suppl  1).      Assessment & Plan:    1. Hospital discharge follow-up  2. Sickle cell pain crisis [facility]) He will be admitted to [facility] today. He will continue to take pain medications as prescribed; will continue to avoid extreme heat and cold; will continue to eat a healthy diet and drink at least 64 ounces of water daily; continue stool softener as needed; will avoid colds and flu; will continue to get plenty of sleep and rest; will continue to avoid high stressful situations and remain infection free; will continue Folic Acid 1 mg daily to avoid sickle cell crisis. Continue to follow up with Hematologist as needed.   3. Chronic, continuous use of opioids  4. Other chronic pain  5. Generalized anxiety disorder  6. Seasonal allergies - cetirizine (ZYRTEC) 10 MG tablet; Take 1 tablet (10 mg total) by mouth daily.  Dispense: 30 tablet;  Refill: 11  7.  Follow up He will follow up in 2 months.   Meds ordered this encounter  Medications  . cetirizine (ZYRTEC) 10 MG tablet    Sig: Take 1 tablet (10 mg total) by mouth daily.    Dispense:  30 tablet    Refill:  11    No orders of the defined types were placed in this encounter.   Referral Orders  No referral(s) requested today    [name],  [name], [name]-BC [facility] [address] [place], [place] [address] [phone] fax  Problem List Items Addressed This Visit      Other   Generalized anxiety disorder   Sickle cell pain crisis [facility])    Other Visit Diagnoses    Hospital discharge follow-up    -  Primary   Chronic, continuous use of opioids       Other chronic pain       Seasonal allergies       Relevant Medications   cetirizine (ZYRTEC) 10 MG tablet   Follow up          Meds ordered this encounter  Medications  . cetirizine (ZYRTEC) 10 MG tablet    Sig: Take 1 tablet (10 mg total) by mouth daily.    Dispense:  30 tablet    Refill:  11    Follow-up: Return in about 2 months (around 10/04/2019).    [name], [name]

## 2019-08-05 MED ORDER — FLEET ENEMA 7-19 GM/118ML RE ENEM
1.0000 | ENEMA | Freq: Once | RECTAL | Status: AC
  Administered 2019-08-05: 1 via RECTAL
  Filled 2019-08-05: qty 1

## 2019-08-05 NOTE — Progress Notes (Addendum)
   08/05/19 0829  Vitals  Resp 10  Oxygen Therapy  SpO2 94 %  O2 Device Nasal Cannula  O2 Flow Rate (L/min) 2 L/min  End [place] CO2 (EtCO2) 44  Pain Assessment  Pain Score 8  Pain Type Chronic pain  Pain Location Back  Pain Radiating Towards generalized  Pain Descriptors / Indicators Aching;Sharp  Pain Frequency Constant  Pain Onset On-going  Patients Stated Pain Goal 4  Pain Intervention(s) PCA encouraged  POSS Scale ([place] Opioid Sedation Scale)  POSS *See Group Information* 1-Acceptable,Awake and alert  PCA/Epidural/Spinal Assessment  Respiratory Pattern Regular;Unlabored;Symmetrical  MEWS Score  MEWS Temp 0  MEWS Systolic 1  MEWS Pulse 0  MEWS RR 1  MEWS LOC 0  MEWS Score 2  MEWS Score Color Yellow  Note  Observations resting/no distress  [place] Sedation Scale  Consciousness 2  Airway 2  Movement 2  Total Score 6   Pt VS taken. Pt resting and no distress.

## 2019-08-05 NOTE — Progress Notes (Addendum)
   08/05/19 0836  Vitals  BP (!) 104/50  MAP (mmHg) 67  BP Location Right Arm  BP Method Automatic  Patient Position (if appropriate) Lying  Pulse Rate 73  Pulse Rate Source Monitor  Resp 12  Level of Consciousness  Level of Consciousness Alert  Oxygen Therapy  SpO2 92 %  O2 Device Nasal Cannula  O2 Flow Rate (L/min) 2 L/min  MEWS Score  MEWS Temp 0  MEWS Systolic 0  MEWS Pulse 0  MEWS RR 1  MEWS LOC 0  MEWS Score 1  MEWS Score Color Green   Pt mews yellow 5 minutes ago. Alert and resting quietly w/no distress. Full set of vitals obtained WNL. Pt MEWS green.

## 2019-08-05 NOTE — Progress Notes (Signed)
Subjective: Martin [name], a 29 year old male with a medical history significant for sickle cell disease, HIV on antiretroviral therapy, history of generalized anxiety, and history of anemia of chronic disease and history of pulmonary embolism who was admitted for sickle cell pain crisis.  Today he feels that his pain is slightly improved but still at about 7/10.  Patient is maximizing his PCA.  He claimed he completed Eliquis and no longer taking the pills.  Pain is still localized to lower back and lower extremities, no joint redness or swelling.  Patient denies any fever, headache, dizziness, cough, chest pain, shortness of breath, nausea, vomiting or diarrhea.  He endorses constipation and requested Fleet enema.  Objective:  Vital signs in last 24 hours:  Vitals:   08/05/19 0117 08/05/19 0544 08/05/19 0829 08/05/19 0836  BP: (!) 105/58 98/69  (!) 104/50  Pulse: 87 77  73  Resp: 16 18 10 12   Temp: 99.1 F (37.3 C) 98.7 F (37.1 C)    TempSrc: Oral Oral    SpO2: 94% 98% 94% 92%  Weight:      Height:        Intake/Output from previous day:   Intake/Output Summary (Last 24 hours) at 08/05/2019 1213 Last data filed at 08/05/2019 1003 Gross per 24 hour  Intake 3617.58 ml  Output 1050 ml  Net 2567.58 ml    Physical Exam: General: Alert, awake, oriented x3, in no acute distress.  HEENT: [place]/AT PEERL, EOMI Neck: Trachea midline,  no masses, no thyromegal,y no JVD, no carotid bruit OROPHARYNX:  Moist, No exudate/ erythema/lesions.  Heart: Regular rate and rhythm, without murmurs, rubs, gallops, PMI non-displaced, no heaves or thrills on palpation.  Lungs: Clear to auscultation, no wheezing or rhonchi noted. No increased vocal fremitus resonant to percussion  Abdomen: Soft, nontender, nondistended, positive bowel sounds, no masses no hepatosplenomegaly noted..  Neuro: No focal neurological deficits noted cranial nerves II through XII grossly intact. DTRs 2+ bilaterally upper and  lower extremities. Strength 5 out of 5 in bilateral upper and lower extremities. Musculoskeletal: No warm swelling or erythema around joints, no spinal tenderness noted. Psychiatric: Patient alert and oriented x3, good insight and cognition, good recent to remote recall. Lymph node survey: No cervical axillary or inguinal lymphadenopathy noted.  Lab Results:  Basic Metabolic Panel:    Component Value Date/Time   NA 136 08/04/2019 1023   NA 136 10/17/2018 1433   K 4.6 08/04/2019 1023   CL 105 08/04/2019 1023   CO2 23 08/04/2019 1023   BUN 9 08/04/2019 1023   BUN 6 10/17/2018 1433   CREATININE 0.60 (L) 08/04/2019 1023   GLUCOSE 85 08/04/2019 1023   CALCIUM 8.7 (L) 08/04/2019 1023   CBC:    Component Value Date/Time   WBC 12.3 (H) 08/04/2019 1023   HGB 8.4 (L) 08/04/2019 1023   HGB 9.6 (L) 10/17/2018 1433   HCT 22.9 (L) 08/04/2019 1023   HCT 26.2 (L) 10/17/2018 1433   PLT 355 08/04/2019 1023   PLT 297 10/17/2018 1433   MCV 101.8 (H) 08/04/2019 1023   MCV 108 (H) 10/17/2018 1433   NEUTROABS 5.7 08/04/2019 1023   NEUTROABS 7.0 10/17/2018 1433   LYMPHSABS 5.2 (H) 08/04/2019 1023   LYMPHSABS 4.6 (H) 10/17/2018 1433   MONOABS 1.2 (H) 08/04/2019 1023   EOSABS 0.1 08/04/2019 1023   EOSABS 0.2 10/17/2018 1433   BASOSABS 0.0 08/04/2019 1023   BASOSABS 0.1 10/17/2018 1433    Recent Results (from the past 240  hour(s))  Respiratory Panel by RT PCR (Flu A&B, Covid) - Nasopharyngeal Swab     Status: None   Collection Time: 08/04/19  6:11 PM   Specimen: Nasopharyngeal Swab  Result Value Ref Range Status   SARS Coronavirus 2 by RT PCR NEGATIVE NEGATIVE Final    Comment: (NOTE) SARS-CoV-2 target nucleic acids are NOT DETECTED. The SARS-CoV-2 RNA is generally detectable in upper respiratoy specimens during the acute phase of infection. The lowest concentration of SARS-CoV-2 viral copies this assay can detect is 131 copies/mL. A negative result does not preclude SARS-Cov-2 infection  and should not be used as the sole basis for treatment or other patient management decisions. A negative result may occur with  improper specimen collection/handling, submission of specimen other than nasopharyngeal swab, presence of viral mutation(s) within the areas targeted by this assay, and inadequate number of viral copies (<131 copies/mL). A negative result must be combined with clinical observations, patient history, and epidemiological information. The expected result is Negative. Fact Sheet for Patients:  PinkCheek.be Fact Sheet for Healthcare Providers:  GravelBags.it This test is not yet ap proved or cleared by the [place] FDA and  has been authorized for detection and/or diagnosis of SARS-CoV-2 by FDA under an Emergency Use Authorization (EUA). This EUA will remain  in effect (meaning this test can be used) for the duration of the COVID-19 declaration under Section 564(b)(1) of the Act, 21 U.S.C. section 360bbb-3(b)(1), unless the authorization is terminated or revoked sooner.    Influenza A by PCR NEGATIVE NEGATIVE Final   Influenza B by PCR NEGATIVE NEGATIVE Final    Comment: (NOTE) The Xpert Xpress SARS-CoV-2/FLU/RSV assay is intended as an aid in  the diagnosis of influenza from Nasopharyngeal swab specimens and  should not be used as a sole basis for treatment. Nasal washings and  aspirates are unacceptable for Xpert Xpress SARS-CoV-2/FLU/RSV  testing. Fact Sheet for Patients: PinkCheek.be Fact Sheet for Healthcare Providers: GravelBags.it This test is not yet approved or cleared by the [place] FDA and  has been authorized for detection and/or diagnosis of SARS-CoV-2 by  FDA under an Emergency Use Authorization (EUA). This EUA will remain  in effect (meaning this test can be used) for the duration of the  Covid-19 declaration under Section  564(b)(1) of the Act, 21  U.S.C. section 360bbb-3(b)(1), unless the authorization is  terminated or revoked. Performed at [facility], [place] [address], [place], [place] [address]     Studies/Results: No results found.  Medications: Scheduled Meds: . abacavir-dolutegravir-lamiVUDine  1 tablet Oral QHS  . enoxaparin (LOVENOX) injection  40 mg Subcutaneous Q24H  . HYDROmorphone   Intravenous Q4H  . hydroxyurea  2,000 mg Oral QHS  . senna-docusate  1 tablet Oral BID   Continuous Infusions: . sodium chloride 150 mL/hr at 08/05/19 1003  . diphenhydrAMINE 25 mg (08/04/19 2215)   PRN Meds:.ALPRAZolam, diphenhydrAMINE **OR** diphenhydrAMINE, naloxone **AND** sodium chloride flush, ondansetron (ZOFRAN) IV, polyethylene glycol  Assessment/Plan: Principal Problem:   Sickle cell anemia with crisis ([place]) Active Problems:   Generalized anxiety disorder   HIV (human immunodeficiency virus infection) ([place])   Chronic pain syndrome   History of pulmonary embolus (PE)  1. Hb Sickle Cell Disease with crisis: Reduce IVF to 75 mls/hour, continue weight based Dilaudid PCA, IV Toradol is contraindicated due to allergy, continue oral oxycodone monitor vitals very closely, Re-evaluate pain scale regularly, 2 L of Oxygen by [place]. 2. Leukocytosis: Mild, no fever or any evidence of  infection or inflammation.  We will continue to monitor. 3. Sickle Cell Anemia: Hemoglobin is at baseline, no clinical indication for blood transfusion today.  Repeat labs in a.m. 4. Chronic pain Syndrome: Continue home pain medications. 5. HIV infection: Continue home medications  6. Constipation: Fleet enema as needed  Code Status: Full Code Family Communication: N/A Disposition Plan: Not yet ready for discharge  Layken Beg  If 7PM-7AM, please contact night-coverage.  08/05/2019, 12:13 PM  LOS: 1 day Patient ID: Dennison Bulla, male   DOB: [birth year], 29 y.o.   MRN: [id number]

## 2019-08-06 ENCOUNTER — Inpatient Hospital Stay (HOSPITAL_COMMUNITY): Payer: Medicaid Other

## 2019-08-06 DIAGNOSIS — R0989 Other specified symptoms and signs involving the circulatory and respiratory systems: Secondary | ICD-10-CM

## 2019-08-06 LAB — CBC WITH DIFFERENTIAL/PLATELET
Abs Immature Granulocytes: 0.11 10*3/uL — ABNORMAL HIGH (ref 0.00–0.07)
Basophils Absolute: 0 10*3/uL (ref 0.0–0.1)
Basophils Relative: 0 %
Eosinophils Absolute: 0.5 10*3/uL (ref 0.0–0.5)
Eosinophils Relative: 4 %
HCT: 20.9 % — ABNORMAL LOW (ref 39.0–52.0)
Hemoglobin: 7.4 g/dL — ABNORMAL LOW (ref 13.0–17.0)
Immature Granulocytes: 1 %
Lymphocytes Relative: 45 %
Lymphs Abs: 5.9 10*3/uL — ABNORMAL HIGH (ref 0.7–4.0)
MCH: 37.8 pg — ABNORMAL HIGH (ref 26.0–34.0)
MCHC: 35.4 g/dL (ref 30.0–36.0)
MCV: 106.6 fL — ABNORMAL HIGH (ref 80.0–100.0)
Monocytes Absolute: 1.3 10*3/uL — ABNORMAL HIGH (ref 0.1–1.0)
Monocytes Relative: 10 %
Neutro Abs: 5.1 10*3/uL (ref 1.7–7.7)
Neutrophils Relative %: 40 %
Platelets: 356 10*3/uL (ref 150–400)
RBC: 1.96 MIL/uL — ABNORMAL LOW (ref 4.22–5.81)
RDW: 20.1 % — ABNORMAL HIGH (ref 11.5–15.5)
WBC: 12.9 10*3/uL — ABNORMAL HIGH (ref 4.0–10.5)
nRBC: 7.1 % — ABNORMAL HIGH (ref 0.0–0.2)

## 2019-08-06 MED ORDER — ENSURE ENLIVE PO LIQD
237.0000 mL | Freq: Two times a day (BID) | ORAL | Status: DC
  Administered 2019-08-06 – 2019-08-09 (×6): 237 mL via ORAL

## 2019-08-06 MED ORDER — SODIUM CHLORIDE 0.9 % IV SOLN
2.0000 g | Freq: Three times a day (TID) | INTRAVENOUS | Status: DC
  Administered 2019-08-06 – 2019-08-09 (×9): 2 g via INTRAVENOUS
  Filled 2019-08-06 (×11): qty 2

## 2019-08-06 MED ORDER — FLEET ENEMA 7-19 GM/118ML RE ENEM
1.0000 | ENEMA | Freq: Every day | RECTAL | Status: DC | PRN
  Administered 2019-08-06 – 2019-08-09 (×3): 1 via RECTAL
  Filled 2019-08-06 (×4): qty 1

## 2019-08-06 NOTE — Progress Notes (Addendum)
Subjective: Martin [name], a 29 year old male with a medical history significant for sickle cell disease, HIV on antiretroviral therapy, history of generalized anxiety, and history of anemia of chronic disease and history of pulmonary embolism who was admitted for sickle cell pain crisis.  Patient general condition slowly improving.  Pain is now 6/10 characterized as throbbing and achy, localized to lower back and lower extremities.  Patient also complained of some chest congestion and he began to cough this morning, clear sputum.  He denies any shortness of breath, no fever, no chest pain.  He denies any nausea, vomiting or diarrhea.  He had a very minimal bowel movement yesterday, will likely repeat of Fleet enema as needed.  Objective:  Vital signs in last 24 hours:  Vitals:   08/06/19 0809 08/06/19 0937 08/06/19 1034 08/06/19 1222  BP:  95/60 105/67   Pulse:  84 87   Resp: 12 12 16 14   Temp:  98.8 F (37.1 C) 98.8 F (37.1 C)   TempSrc:  Oral Oral   SpO2: 92% 91% 90% 90%  Weight:      Height:        Intake/Output from previous day:   Intake/Output Summary (Last 24 hours) at 08/06/2019 1624 Last data filed at 08/06/2019 1250 Gross per 24 hour  Intake 2129.87 ml  Output 2450 ml  Net -320.13 ml    Physical Exam: General: Alert, awake, oriented x3, in no acute distress.  HEENT: [place]/AT PEERL, EOMI Neck: Trachea midline,  no masses, no thyromegal,y no JVD, no carotid bruit OROPHARYNX:  Moist, No exudate/ erythema/lesions.  Heart: Regular rate and rhythm, without murmurs, rubs, gallops, PMI non-displaced, no heaves or thrills on palpation.  Lungs: Clear to auscultation, no wheezing or rhonchi noted. No increased vocal fremitus resonant to percussion  Abdomen: Soft, nontender, nondistended, positive bowel sounds, no masses no hepatosplenomegaly noted..  Neuro: No focal neurological deficits noted cranial nerves II through XII grossly intact. DTRs 2+ bilaterally upper and lower  extremities. Strength 5 out of 5 in bilateral upper and lower extremities. Musculoskeletal: No warm swelling or erythema around joints, no spinal tenderness noted. Psychiatric: Patient alert and oriented x3, good insight and cognition, good recent to remote recall. Lymph node survey: No cervical axillary or inguinal lymphadenopathy noted.  Lab Results:  Basic Metabolic Panel:    Component Value Date/Time   NA 136 08/04/2019 1023   NA 136 10/17/2018 1433   K 4.6 08/04/2019 1023   CL 105 08/04/2019 1023   CO2 23 08/04/2019 1023   BUN 9 08/04/2019 1023   BUN 6 10/17/2018 1433   CREATININE 0.60 (L) 08/04/2019 1023   GLUCOSE 85 08/04/2019 1023   CALCIUM 8.7 (L) 08/04/2019 1023   CBC:    Component Value Date/Time   WBC 12.9 (H) 08/06/2019 0522   HGB 7.4 (L) 08/06/2019 0522   HGB 9.6 (L) 10/17/2018 1433   HCT 20.9 (L) 08/06/2019 0522   HCT 26.2 (L) 10/17/2018 1433   PLT 356 08/06/2019 0522   PLT 297 10/17/2018 1433   MCV 106.6 (H) 08/06/2019 0522   MCV 108 (H) 10/17/2018 1433   NEUTROABS 5.1 08/06/2019 0522   NEUTROABS 7.0 10/17/2018 1433   LYMPHSABS 5.9 (H) 08/06/2019 0522   LYMPHSABS 4.6 (H) 10/17/2018 1433   MONOABS 1.3 (H) 08/06/2019 0522   EOSABS 0.5 08/06/2019 0522   EOSABS 0.2 10/17/2018 1433   BASOSABS 0.0 08/06/2019 0522   BASOSABS 0.1 10/17/2018 1433    Recent Results (from the past  240 hour(s))  Respiratory Panel by RT PCR (Flu A&B, Covid) - Nasopharyngeal Swab     Status: None   Collection Time: 08/04/19  6:11 PM   Specimen: Nasopharyngeal Swab  Result Value Ref Range Status   SARS Coronavirus 2 by RT PCR NEGATIVE NEGATIVE Final    Comment: (NOTE) SARS-CoV-2 target nucleic acids are NOT DETECTED. The SARS-CoV-2 RNA is generally detectable in upper respiratoy specimens during the acute phase of infection. The lowest concentration of SARS-CoV-2 viral copies this assay can detect is 131 copies/mL. A negative result does not preclude SARS-Cov-2 infection and  should not be used as the sole basis for treatment or other patient management decisions. A negative result may occur with  improper specimen collection/handling, submission of specimen other than nasopharyngeal swab, presence of viral mutation(s) within the areas targeted by this assay, and inadequate number of viral copies (<131 copies/mL). A negative result must be combined with clinical observations, patient history, and epidemiological information. The expected result is Negative. Fact Sheet for Patients:  PinkCheek.be Fact Sheet for Healthcare Providers:  GravelBags.it This test is not yet ap proved or cleared by the [place] FDA and  has been authorized for detection and/or diagnosis of SARS-CoV-2 by FDA under an Emergency Use Authorization (EUA). This EUA will remain  in effect (meaning this test can be used) for the duration of the COVID-19 declaration under Section 564(b)(1) of the Act, 21 U.S.C. section 360bbb-3(b)(1), unless the authorization is terminated or revoked sooner.    Influenza A by PCR NEGATIVE NEGATIVE Final   Influenza B by PCR NEGATIVE NEGATIVE Final    Comment: (NOTE) The Xpert Xpress SARS-CoV-2/FLU/RSV assay is intended as an aid in  the diagnosis of influenza from Nasopharyngeal swab specimens and  should not be used as a sole basis for treatment. Nasal washings and  aspirates are unacceptable for Xpert Xpress SARS-CoV-2/FLU/RSV  testing. Fact Sheet for Patients: PinkCheek.be Fact Sheet for Healthcare Providers: GravelBags.it This test is not yet approved or cleared by the [place] FDA and  has been authorized for detection and/or diagnosis of SARS-CoV-2 by  FDA under an Emergency Use Authorization (EUA). This EUA will remain  in effect (meaning this test can be used) for the duration of the  Covid-19 declaration under Section  564(b)(1) of the Act, 21  U.S.C. section 360bbb-3(b)(1), unless the authorization is  terminated or revoked. Performed at [facility], [place] [address], [place], [place] [address]     Studies/Results: DG Chest 2 View  Result Date: 08/06/2019 CLINICAL DATA:  Chest congestion. History of sickle cell, HIV. EXAM: CHEST - 2 VIEW COMPARISON:  Chest x-ray dated 04/23/2019. FINDINGS: Heart size and mediastinal contours are stable. Coarse lung markings are again seen bilaterally, not significantly changed. Questionable new small opacity within the LEFT upper lung. No pleural effusion or pneumothorax is seen. IMPRESSION: 1. Questionable new small opacity within the LEFT upper lung, which could represent early pneumonia or asymmetric edema. 2. Chronic interstitial lung disease/fibrosis. Electronically Signed   By: Franki Cabot M.D.   On: 08/06/2019 14:26    Medications: Scheduled Meds: . abacavir-dolutegravir-lamiVUDine  1 tablet Oral QHS  . enoxaparin (LOVENOX) injection  40 mg Subcutaneous Q24H  . feeding supplement (ENSURE ENLIVE)  237 mL Oral BID BM  . HYDROmorphone   Intravenous Q4H  . hydroxyurea  2,000 mg Oral QHS  . senna-docusate  1 tablet Oral BID   Continuous Infusions: . sodium chloride 50 mL/hr at 08/06/19 1220  .  diphenhydrAMINE 25 mg (08/06/19 1011)   PRN Meds:.ALPRAZolam, diphenhydrAMINE **OR** diphenhydrAMINE, naloxone **AND** sodium chloride flush, ondansetron (ZOFRAN) IV, polyethylene glycol, sodium phosphate  Assessment/Plan: Principal Problem:   Sickle cell anemia with crisis ([facility]) Active Problems:   Generalized anxiety disorder   HIV (human immunodeficiency virus infection) ([facility])   Chronic pain syndrome   History of pulmonary embolus (PE)  1. Hb Sickle Cell Disease with crisis: Reduce IVF to 50 mls/hour, continue weight based Dilaudid PCA, IV Toradol is contraindicated due to allergy, continue oral oxycodone monitor vitals very closely, Re-evaluate  pain scale regularly, 2 L of Oxygen by [place]. 2. Leukocytosis: Mild, no fever or any evidence of infection or inflammation. We will continue to monitor. 3. Chest congestion: We will order chest x-ray and managed accordingly. Since there is no fever, clear sputum from cough and no shortness of breath, doubt pneumonia or acute chest syndrome. Will monitor very closely. 4. Sickle Cell Anemia: Hemoglobin is stable, no clinical indication for blood transfusion today.  Repeat labs in a.m. 5. Chronic pain Syndrome: Continue home pain medications. 6. HIV infection: Continue home medications  7. Constipation: Fleet enema as needed  Code Status: Full Code Family Communication: N/A Disposition Plan: Not yet ready for discharge  Rozanne Heumann  If 7PM-7AM, please contact night-coverage.  08/06/2019, 4:24 PM  LOS: 2 days Patient ID: Martin [name], male   DOB: [birth year], 29 y.o.   MRN: [id number]

## 2019-08-06 NOTE — Progress Notes (Addendum)
Pharmacy Antibiotic Note  Martin [name] is a 29 y.o. male admitted on 08/04/2019 with sickle cell pain crisis.  Pharmacy has been consulted for empiric cefepime dosing for possible PNA.  Plan: Cefepime 2 g iv q 8h - F/U renal function and plans for antibiotics   Height: 6\' 3"  (190.5 cm) Weight: 68 kg (149 lb 14.6 oz) IBW/kg (Calculated) : 84.5  Temp (24hrs), Avg:98.6 F (37 C), Min:98.1 F (36.7 C), Max:98.9 F (37.2 C)  Recent Labs  Lab 08/04/19 1023 08/06/19 0522  WBC 12.3* 12.9*  CREATININE 0.60*  --     Estimated Creatinine Clearance: 131 mL/min (A) (by C-G formula based on SCr of 0.6 mg/dL (L)).    Allergies  Allergen Reactions  . Tape Rash and Other (See Comments)    PLEASE DO NOT USE THE CLEAR, THICK, "PLASTIC" TAPE- only paper tape is tolerated   . Ketorolac Swelling and Other (See Comments)    Patient reports facial edema and left arm edema after administration.    Thank you for allowing pharmacy to be a part of this patient's care.  08/08/19 08/06/2019 6:46 PM

## 2019-08-06 NOTE — Progress Notes (Signed)
   08/06/19 0937  Vitals  Temp 98.8 F (37.1 C)  Temp Source Oral  BP 95/60  MAP (mmHg) 72  BP Location Right Arm  BP Method Automatic  Patient Position (if appropriate) Sitting  Pulse Rate 84  Pulse Rate Source Monitor  Resp 12  Level of Consciousness  Level of Consciousness Alert  Oxygen Therapy  SpO2 91 %  O2 Device Nasal Cannula  O2 Flow Rate (L/min) 4 L/min  POSS Scale ([place] Opioid Sedation Scale)  POSS *See Group Information* 1-Acceptable,Awake and alert  PCA/Epidural/Spinal Assessment  Respiratory Pattern Regular;Unlabored;Symmetrical  MEWS Score  MEWS Temp 0  MEWS Systolic 1  MEWS Pulse 0  MEWS RR 1  MEWS LOC 0  MEWS Score 2  MEWS Score Color Yellow  Note  Observations Resting/ambulates intermittently/No distress  [place] Sedation Scale  Consciousness 2  Airway 2  Movement 2  Total Score 6

## 2019-08-07 DIAGNOSIS — R0989 Other specified symptoms and signs involving the circulatory and respiratory systems: Secondary | ICD-10-CM

## 2019-08-07 LAB — COMPREHENSIVE METABOLIC PANEL
ALT: 20 U/L (ref 0–44)
AST: 51 U/L — ABNORMAL HIGH (ref 15–41)
Albumin: 3.2 g/dL — ABNORMAL LOW (ref 3.5–5.0)
Alkaline Phosphatase: 52 U/L (ref 38–126)
Anion gap: 7 (ref 5–15)
BUN: 12 mg/dL (ref 6–20)
CO2: 26 mmol/L (ref 22–32)
Calcium: 8.5 mg/dL — ABNORMAL LOW (ref 8.9–10.3)
Chloride: 100 mmol/L (ref 98–111)
Creatinine, Ser: 0.74 mg/dL (ref 0.61–1.24)
GFR calc Af Amer: >60 mL/min (ref >60)
GFR calc non Af Amer: >60 mL/min (ref >60)
Glucose, Bld: 95 mg/dL (ref 70–99)
Potassium: 4.3 mmol/L (ref 3.5–5.1)
Sodium: 133 mmol/L — ABNORMAL LOW (ref 135–145)
Total Bilirubin: 7 mg/dL — ABNORMAL HIGH (ref 0.3–1.2)
Total Protein: 8 g/dL (ref 6.5–8.1)

## 2019-08-07 LAB — CBC WITH DIFFERENTIAL/PLATELET
Abs Immature Granulocytes: 0.07 10*3/uL (ref 0.00–0.07)
Basophils Absolute: 0.1 10*3/uL (ref 0.0–0.1)
Basophils Relative: 0 %
Eosinophils Absolute: 0.5 10*3/uL (ref 0.0–0.5)
Eosinophils Relative: 3 %
HCT: 17.6 % — ABNORMAL LOW (ref 39.0–52.0)
Hemoglobin: 7 g/dL — ABNORMAL LOW (ref 13.0–17.0)
Immature Granulocytes: 1 %
Lymphocytes Relative: 53 %
Lymphs Abs: 7.8 10*3/uL — ABNORMAL HIGH (ref 0.7–4.0)
MCH: 38.1 pg — ABNORMAL HIGH (ref 26.0–34.0)
MCHC: 39.8 g/dL — ABNORMAL HIGH (ref 30.0–36.0)
MCV: 101.9 fL — ABNORMAL HIGH (ref 80.0–100.0)
Monocytes Absolute: 1.1 10*3/uL — ABNORMAL HIGH (ref 0.1–1.0)
Monocytes Relative: 7 %
Neutro Abs: 5.3 10*3/uL (ref 1.7–7.7)
Neutrophils Relative %: 36 %
Platelets: 325 10*3/uL (ref 150–400)
RBC: 1.83 MIL/uL — ABNORMAL LOW (ref 4.22–5.81)
RDW: 20.6 % — ABNORMAL HIGH (ref 11.5–15.5)
WBC: 14 10*3/uL — ABNORMAL HIGH (ref 4.0–10.5)
nRBC: 4.1 % — ABNORMAL HIGH (ref 0.0–0.2)

## 2019-08-07 MED ORDER — LIP MEDEX EX OINT
TOPICAL_OINTMENT | CUTANEOUS | Status: AC
  Filled 2019-08-07: qty 7

## 2019-08-07 NOTE — Plan of Care (Signed)
Plan of care reviewed and discussed with the patient. 

## 2019-08-07 NOTE — Progress Notes (Signed)
Subjective: Martin [name], a 29 year old male with a medical history significant for sickle cell disease, HIV on antiretroviral therapy, history of generalized anxiety, history of pulmonary embolism and history of anemia of chronic disease was admitted for pain crisis.  Patient states that he continues to have pain that is localized to low back and lower extremities.  Pain intensity is 6-7/10 on today.  His pain is characterized as constant and throbbing.  Patient has no new complaints on today. He denies headache, shortness of breath, fever, chills, chest pain, nausea, vomiting, or diarrhea.  Objective:  Vital signs in last 24 hours:  Vitals:   08/07/19 0757 08/07/19 1100 08/07/19 1331 08/07/19 1509  BP:   111/66   Pulse:   97   Resp: 10 11 18 14   Temp:   98.7 F (37.1 C)   TempSrc:   Oral   SpO2: 90% 93% 95% 90%  Weight:      Height:        Intake/Output from previous day:   Intake/Output Summary (Last 24 hours) at 08/07/2019 1731 Last data filed at 08/07/2019 1726 Gross per 24 hour  Intake 2315.84 ml  Output 800 ml  Net 1515.84 ml    Physical Exam: General: Alert, awake, oriented x3, in no acute distress.  HEENT: [place]/AT PEERL, EOMI Neck: Trachea midline,  no masses, no thyromegal,y no JVD, no carotid bruit OROPHARYNX:  Moist, No exudate/ erythema/lesions.  Heart: Regular rate and rhythm, without murmurs, rubs, gallops, PMI non-displaced, no heaves or thrills on palpation.  Lungs: Clear to auscultation, no wheezing or rhonchi noted. No increased vocal fremitus resonant to percussion  Abdomen: Soft, nontender, nondistended, positive bowel sounds, no masses no hepatosplenomegaly noted..  Neuro: No focal neurological deficits noted cranial nerves II through XII grossly intact. DTRs 2+ bilaterally upper and lower extremities. Strength 5 out of 5 in bilateral upper and lower extremities. Musculoskeletal: No warm swelling or erythema around joints, no spinal tenderness  noted. Psychiatric: Patient alert and oriented x3, good insight and cognition, good recent to remote recall. Lymph node survey: No cervical axillary or inguinal lymphadenopathy noted.  Lab Results:  Basic Metabolic Panel:    Component Value Date/Time   NA 133 (L) 08/07/2019 0503   NA 136 10/17/2018 1433   K 4.3 08/07/2019 0503   CL 100 08/07/2019 0503   CO2 26 08/07/2019 0503   BUN 12 08/07/2019 0503   BUN 6 10/17/2018 1433   CREATININE 0.74 08/07/2019 0503   GLUCOSE 95 08/07/2019 0503   CALCIUM 8.5 (L) 08/07/2019 0503   CBC:    Component Value Date/Time   WBC 14.0 (H) 08/07/2019 0503   HGB 7.0 (L) 08/07/2019 0503   HGB 9.6 (L) 10/17/2018 1433   HCT 17.6 (L) 08/07/2019 0503   HCT 26.2 (L) 10/17/2018 1433   PLT 325 08/07/2019 0503   PLT 297 10/17/2018 1433   MCV 101.9 (H) 08/07/2019 0503   MCV 108 (H) 10/17/2018 1433   NEUTROABS 5.3 08/07/2019 0503   NEUTROABS 7.0 10/17/2018 1433   LYMPHSABS 7.8 (H) 08/07/2019 0503   LYMPHSABS 4.6 (H) 10/17/2018 1433   MONOABS 1.1 (H) 08/07/2019 0503   EOSABS 0.5 08/07/2019 0503   EOSABS 0.2 10/17/2018 1433   BASOSABS 0.1 08/07/2019 0503   BASOSABS 0.1 10/17/2018 1433    Recent Results (from the past 240 hour(s))  Respiratory Panel by RT PCR (Flu A&B, Covid) - Nasopharyngeal Swab     Status: None   Collection Time: 08/04/19  6:11 PM  Specimen: Nasopharyngeal Swab  Result Value Ref Range Status   SARS Coronavirus 2 by RT PCR NEGATIVE NEGATIVE Final    Comment: (NOTE) SARS-CoV-2 target nucleic acids are NOT DETECTED. The SARS-CoV-2 RNA is generally detectable in upper respiratoy specimens during the acute phase of infection. The lowest concentration of SARS-CoV-2 viral copies this assay can detect is 131 copies/mL. A negative result does not preclude SARS-Cov-2 infection and should not be used as the sole basis for treatment or other patient management decisions. A negative result may occur with  improper specimen  collection/handling, submission of specimen other than nasopharyngeal swab, presence of viral mutation(s) within the areas targeted by this assay, and inadequate number of viral copies (<131 copies/mL). A negative result must be combined with clinical observations, patient history, and epidemiological information. The expected result is Negative. Fact Sheet for Patients:  https://www.moore.com/ Fact Sheet for Healthcare Providers:  https://www.young.biz/ This test is not yet ap proved or cleared by the [place] FDA and  has been authorized for detection and/or diagnosis of SARS-CoV-2 by FDA under an Emergency Use Authorization (EUA). This EUA will remain  in effect (meaning this test can be used) for the duration of the COVID-19 declaration under Section 564(b)(1) of the Act, 21 U.S.C. section 360bbb-3(b)(1), unless the authorization is terminated or revoked sooner.    Influenza A by PCR NEGATIVE NEGATIVE Final   Influenza B by PCR NEGATIVE NEGATIVE Final    Comment: (NOTE) The Xpert Xpress SARS-CoV-2/FLU/RSV assay is intended as an aid in  the diagnosis of influenza from Nasopharyngeal swab specimens and  should not be used as a sole basis for treatment. Nasal washings and  aspirates are unacceptable for Xpert Xpress SARS-CoV-2/FLU/RSV  testing. Fact Sheet for Patients: https://www.moore.com/ Fact Sheet for Healthcare Providers: https://www.young.biz/ This test is not yet approved or cleared by the [place] FDA and  has been authorized for detection and/or diagnosis of SARS-CoV-2 by  FDA under an Emergency Use Authorization (EUA). This EUA will remain  in effect (meaning this test can be used) for the duration of the  Covid-19 declaration under Section 564(b)(1) of the Act, 21  U.S.C. section 360bbb-3(b)(1), unless the authorization is  terminated or revoked. Performed at [facility], [address], [place], [place] [address]     Studies/Results: DG Chest 2 View  Result Date: 08/06/2019 CLINICAL DATA:  Chest congestion. History of sickle cell, HIV. EXAM: CHEST - 2 VIEW COMPARISON:  Chest x-ray dated 04/23/2019. FINDINGS: Heart size and mediastinal contours are stable. Coarse lung markings are again seen bilaterally, not significantly changed. Questionable new small opacity within the LEFT upper lung. No pleural effusion or pneumothorax is seen. IMPRESSION: 1. Questionable new small opacity within the LEFT upper lung, which could represent early pneumonia or asymmetric edema. 2. Chronic interstitial lung disease/fibrosis. Electronically Signed   By: Bary Richard M.D.   On: 08/06/2019 14:26    Medications: Scheduled Meds: . abacavir-dolutegravir-lamiVUDine  1 tablet Oral QHS  . enoxaparin (LOVENOX) injection  40 mg Subcutaneous Q24H  . feeding supplement (ENSURE ENLIVE)  237 mL Oral BID BM  . HYDROmorphone   Intravenous Q4H  . hydroxyurea  2,000 mg Oral QHS  . senna-docusate  1 tablet Oral BID   Continuous Infusions: . sodium chloride 50 mL/hr at 08/07/19 1055  . ceFEPime (MAXIPIME) IV 2 g (08/07/19 1653)  . diphenhydrAMINE 25 mg (08/07/19 1508)   PRN Meds:.ALPRAZolam, diphenhydrAMINE **OR** diphenhydrAMINE, naloxone **AND** sodium chloride flush, ondansetron (ZOFRAN) IV,  polyethylene glycol, sodium phosphate  Consultants:  None  Procedures:  None  Antibiotics:  Cefepime per pharmacy consults  Assessment/Plan: Principal Problem:   Sickle cell anemia with crisis ([place]) Active Problems:   Generalized anxiety disorder   HIV (human immunodeficiency virus infection) ([place])   Chronic pain syndrome   History of pulmonary embolus (PE)  Sickle cell disease with pain crisis: Continue [facility] at [facility]. Continue weightbase Dilaudid PCA, no change in settings on today.  IV Toradol is contraindicated due to allergy. Continue home medications.  Monitor  vital signs closely.  Reevaluate pain scale regularly. Supplemental oxygen as needed.  Leukocytosis: Patient has mild leukocytosis.  Chest x-ray done yesterday showed new small opacity within the left upper lung, which could represent early pneumonia or asymmetric edema.  Also, chronic interstitial lung disease/fibrosis.  Continue IV antibiotics.  Patient has been afebrile overnight.  Continue to monitor closely.  Repeat CBC in a.m.  Chest congestion: Patient states that chest congestion has improved.  He is afebrile.  Incentive spirometer recommended.  Monitor closely.  Sickle cell anemia: Hemoglobin stable at 7.0.  There is no clinical indication for blood transfusion on today.  Repeat CBC in a.m.  Chronic pain syndrome: Continue home medications  HIV infection: Continue home medications  Constipation: Fleet enema as needed    Code Status: Full Code Family Communication: N/A Disposition Plan: Not yet ready for discharge   [place], [name], [name] Patient [place], [place] [address] [phone]  If 5PM-7AM, please contact night-coverage.  08/07/2019, 5:31 PM  LOS: 3 days

## 2019-08-08 ENCOUNTER — Inpatient Hospital Stay (HOSPITAL_COMMUNITY): Payer: Medicaid Other

## 2019-08-08 DIAGNOSIS — R0902 Hypoxemia: Secondary | ICD-10-CM

## 2019-08-08 LAB — CBC
HCT: 19 % — ABNORMAL LOW (ref 39.0–52.0)
Hemoglobin: 7.1 g/dL — ABNORMAL LOW (ref 13.0–17.0)
MCH: 37 pg — ABNORMAL HIGH (ref 26.0–34.0)
MCHC: 37.4 g/dL — ABNORMAL HIGH (ref 30.0–36.0)
MCV: 99 fL (ref 80.0–100.0)
Platelets: 289 10*3/uL (ref 150–400)
RBC: 1.92 MIL/uL — ABNORMAL LOW (ref 4.22–5.81)
RDW: 20.3 % — ABNORMAL HIGH (ref 11.5–15.5)
WBC: 14.2 10*3/uL — ABNORMAL HIGH (ref 4.0–10.5)
nRBC: 9.7 % — ABNORMAL HIGH (ref 0.0–0.2)

## 2019-08-08 LAB — BASIC METABOLIC PANEL
Anion gap: 8 (ref 5–15)
BUN: 13 mg/dL (ref 6–20)
CO2: 25 mmol/L (ref 22–32)
Calcium: 8.8 mg/dL — ABNORMAL LOW (ref 8.9–10.3)
Chloride: 103 mmol/L (ref 98–111)
Creatinine, Ser: 0.84 mg/dL (ref 0.61–1.24)
GFR calc Af Amer: >60 mL/min (ref >60)
GFR calc non Af Amer: >60 mL/min (ref >60)
Glucose, Bld: 116 mg/dL — ABNORMAL HIGH (ref 70–99)
Potassium: 4.3 mmol/L (ref 3.5–5.1)
Sodium: 136 mmol/L (ref 135–145)

## 2019-08-08 LAB — GLUCOSE, CAPILLARY: Glucose-Capillary: 98 mg/dL (ref 70–99)

## 2019-08-08 LAB — TYPE AND SCREEN
ABO/RH(D): O POS
Antibody Screen: NEGATIVE

## 2019-08-08 MED ORDER — GUAIFENESIN-DM 100-10 MG/5ML PO SYRP: 5.0000 mL | ORAL_SOLUTION | ORAL | Status: DC | PRN

## 2019-08-08 MED ORDER — CAMPHOR-MENTHOL 0.5-0.5 % EX LOTN
TOPICAL_LOTION | CUTANEOUS | Status: DC | PRN
  Filled 2019-08-08: qty 222

## 2019-08-08 MED ORDER — HYDROMORPHONE HCL 1 MG/ML IJ SOLN
1.0000 mg | Freq: Four times a day (QID) | INTRAMUSCULAR | Status: DC | PRN
  Administered 2019-08-08 – 2019-08-09 (×2): 1 mg via INTRAVENOUS
  Filled 2019-08-08 (×2): qty 1

## 2019-08-08 MED ORDER — ONDANSETRON HCL 4 MG/2ML IJ SOLN
4.0000 mg | Freq: Four times a day (QID) | INTRAMUSCULAR | Status: DC | PRN
  Administered 2019-08-08: 4 mg via INTRAVENOUS

## 2019-08-08 MED ORDER — LIP MEDEX EX OINT
TOPICAL_OINTMENT | CUTANEOUS | Status: AC
  Filled 2019-08-08: qty 7

## 2019-08-08 MED ORDER — OXYCODONE HCL 5 MG PO TABS
10.0000 mg | ORAL_TABLET | ORAL | Status: DC | PRN
  Administered 2019-08-08 – 2019-08-09 (×3): 10 mg via ORAL
  Filled 2019-08-08 (×3): qty 2

## 2019-08-08 MED ORDER — PROCHLORPERAZINE EDISYLATE 10 MG/2ML IJ SOLN
10.0000 mg | Freq: Four times a day (QID) | INTRAMUSCULAR | Status: DC | PRN
  Administered 2019-08-08: 20:00:00 10 mg via INTRAVENOUS
  Filled 2019-08-08: qty 2

## 2019-08-08 MED ORDER — SODIUM CHLORIDE 0.9 % IV SOLN
INTRAVENOUS | Status: DC | PRN
  Administered 2019-08-08: 250 mL via INTRAVENOUS

## 2019-08-08 NOTE — Progress Notes (Addendum)
Subjective: Martin [name], a 29 year old male with a medical history significant for sickle cell disease, HIV on antiretroviral therapy, history of generalized anxiety, history of pulmonary embolism, and history of anemia of chronic disease was admitted for sickle cell pain crisis.  Patient says that pain is improved some overnight.  Pain intensity is 4/10 primarily to low back and lower extremities.  Patient continues to complain of persistent cough.  He endorses some chest discomfort.  He denies headache, shortness of breath, urinary symptoms, nausea, vomiting, or diarrhea.  Objective:  Vital signs in last 24 hours:  Vitals:   08/08/19 0745 08/08/19 0934 08/08/19 1037 08/08/19 1408  BP:  107/74  (!) 100/51  Pulse:  77  88  Resp: 15 10 15 18   Temp:  97.7 F (36.5 C)  98.8 F (37.1 C)  TempSrc:    Oral  SpO2: 100% 98% 98% 97%  Weight:      Height:        Intake/Output from previous day:   Intake/Output Summary (Last 24 hours) at 08/08/2019 1629 Last data filed at 08/08/2019 1400 Gross per 24 hour  Intake 948 ml  Output 2350 ml  Net -1402 ml    Physical Exam: General: Alert, awake, oriented x3, in no acute distress.  HEENT: [place]/AT PEERL, EOMI Neck: Trachea midline,  no masses, no thyromegal,y no JVD, no carotid bruit OROPHARYNX:  Moist, No exudate/ erythema/lesions.  Heart: Regular rate and rhythm, without murmurs, rubs, gallops, PMI non-displaced, no heaves or thrills on palpation.  Lungs: Clear to auscultation, no wheezing or rhonchi noted. No increased vocal fremitus resonant to percussion  Abdomen: Soft, nontender, nondistended, positive bowel sounds, no masses no hepatosplenomegaly noted..  Neuro: No focal neurological deficits noted cranial nerves II through XII grossly intact. DTRs 2+ bilaterally upper and lower extremities. Strength 5 out of 5 in bilateral upper and lower extremities. Musculoskeletal: No warm swelling or erythema around joints, no spinal  tenderness noted. Psychiatric: Patient alert and oriented x3, good insight and cognition, good recent to remote recall. Lymph node survey: No cervical axillary or inguinal lymphadenopathy noted.  Lab Results:  Basic Metabolic Panel:    Component Value Date/Time   NA 136 08/08/2019 0740   NA 136 10/17/2018 1433   K 4.3 08/08/2019 0740   CL 103 08/08/2019 0740   CO2 25 08/08/2019 0740   BUN 13 08/08/2019 0740   BUN 6 10/17/2018 1433   CREATININE 0.84 08/08/2019 0740   GLUCOSE 116 (H) 08/08/2019 0740   CALCIUM 8.8 (L) 08/08/2019 0740   CBC:    Component Value Date/Time   WBC 14.2 (H) 08/08/2019 0740   HGB 7.1 (L) 08/08/2019 0740   HGB 9.6 (L) 10/17/2018 1433   HCT 19.0 (L) 08/08/2019 0740   HCT 26.2 (L) 10/17/2018 1433   PLT 289 08/08/2019 0740   PLT 297 10/17/2018 1433   MCV 99.0 08/08/2019 0740   MCV 108 (H) 10/17/2018 1433   NEUTROABS 5.3 08/07/2019 0503   NEUTROABS 7.0 10/17/2018 1433   LYMPHSABS 7.8 (H) 08/07/2019 0503   LYMPHSABS 4.6 (H) 10/17/2018 1433   MONOABS 1.1 (H) 08/07/2019 0503   EOSABS 0.5 08/07/2019 0503   EOSABS 0.2 10/17/2018 1433   BASOSABS 0.1 08/07/2019 0503   BASOSABS 0.1 10/17/2018 1433    Recent Results (from the past 240 hour(s))  Respiratory Panel by RT PCR (Flu A&B, Covid) - Nasopharyngeal Swab     Status: None   Collection Time: 08/04/19  6:11 PM  Specimen: Nasopharyngeal Swab  Result Value Ref Range Status   SARS Coronavirus 2 by RT PCR NEGATIVE NEGATIVE Final    Comment: (NOTE) SARS-CoV-2 target nucleic acids are NOT DETECTED. The SARS-CoV-2 RNA is generally detectable in upper respiratoy specimens during the acute phase of infection. The lowest concentration of SARS-CoV-2 viral copies this assay can detect is 131 copies/mL. A negative result does not preclude SARS-Cov-2 infection and should not be used as the sole basis for treatment or other patient management decisions. A negative result may occur with  improper specimen  collection/handling, submission of specimen other than nasopharyngeal swab, presence of viral mutation(s) within the areas targeted by this assay, and inadequate number of viral copies (<131 copies/mL). A negative result must be combined with clinical observations, patient history, and epidemiological information. The expected result is Negative. Fact Sheet for Patients:  https://www.moore.com/ Fact Sheet for Healthcare Providers:  https://www.young.biz/ This test is not yet ap proved or cleared by the [place] FDA and  has been authorized for detection and/or diagnosis of SARS-CoV-2 by FDA under an Emergency Use Authorization (EUA). This EUA will remain  in effect (meaning this test can be used) for the duration of the COVID-19 declaration under Section 564(b)(1) of the Act, 21 U.S.C. section 360bbb-3(b)(1), unless the authorization is terminated or revoked sooner.    Influenza A by PCR NEGATIVE NEGATIVE Final   Influenza B by PCR NEGATIVE NEGATIVE Final    Comment: (NOTE) The Xpert Xpress SARS-CoV-2/FLU/RSV assay is intended as an aid in  the diagnosis of influenza from Nasopharyngeal swab specimens and  should not be used as a sole basis for treatment. Nasal washings and  aspirates are unacceptable for Xpert Xpress SARS-CoV-2/FLU/RSV  testing. Fact Sheet for Patients: https://www.moore.com/ Fact Sheet for Healthcare Providers: https://www.young.biz/ This test is not yet approved or cleared by the [place] FDA and  has been authorized for detection and/or diagnosis of SARS-CoV-2 by  FDA under an Emergency Use Authorization (EUA). This EUA will remain  in effect (meaning this test can be used) for the duration of the  Covid-19 declaration under Section 564(b)(1) of the Act, 21  U.S.C. section 360bbb-3(b)(1), unless the authorization is  terminated or revoked. Performed at [facility], [address], [name], [place] [address]     Studies/Results: No results found.  Medications: Scheduled Meds: . abacavir-dolutegravir-lamiVUDine  1 tablet Oral QHS  . enoxaparin (LOVENOX) injection  40 mg Subcutaneous Q24H  . feeding supplement (ENSURE ENLIVE)  237 mL Oral BID BM  . hydroxyurea  2,000 mg Oral QHS  . senna-docusate  1 tablet Oral BID   Continuous Infusions: . ceFEPime (MAXIPIME) IV 2 g (08/08/19 0946)   PRN Meds:.ALPRAZolam, guaiFENesin-dextromethorphan, HYDROmorphone (DILAUDID) injection, ondansetron (ZOFRAN) IV, oxyCODONE, polyethylene glycol, sodium phosphate  Consultants:  None  Procedures:  None  Antibiotics:  None  Assessment/Plan: Principal Problem:   Sickle cell anemia with crisis (HCC) Active Problems:   Generalized anxiety disorder   HIV (human immunodeficiency virus infection) (HCC)   Chronic pain syndrome   History of pulmonary embolus (PE)   Chest congestion Sickle cell disease with pain crisis: Discontinue IV fluids.  Also, discontinue weight-based Dilaudid PCA.  Initiate oxycodone 10 mg every 4 hours as needed Dilaudid 1 mg IV every 4 hours as needed for severe breakthrough pain IV Toradol is contraindicated due to allergy Monitor vital signs closely, reevaluate pain scale regularly, supplemental oxygen as needed.  Leukocytosis: Improved.  Previous chest x-ray showed small new  opacity within the left upper lung which could represent early pneumonia or asymmetric edema.  Repeat chest x-ray on today.  Continue IV antibiotics.  Patient has been afebrile.  Continue to monitor closely.  Repeat CBC in a.m.  Chest congestion: Patient endorses persistent cough and chest discomfort.  Repeat chest x-ray today.  He is afebrile.  Incentive spirometer recommended.  Continue IV antibiotics.  Ambulating pulse ox.  Wean oxygen as tolerated.  Sickle cell anemia: Hemoglobin remained stable at 7.1.  There is no clinical indication for  blood transfusion on today.  Repeat CBC in a.m.  Chronic pain syndrome: Continue home medications  HIV infection: Continue home medications.  Constipation: Fleet enema as needed    Code Status: Full Code Family Communication: N/A Disposition Plan: Not yet ready for discharge.  Discharge plan for 08/09/2019  Donia Pounds  APRN, [name], [name] Patient [facility] [place], [place] [address] [phone]  If 5PM-8AM, please contact night-coverage.  08/08/2019, 4:29 PM  LOS: 4 days

## 2019-08-09 ENCOUNTER — Telehealth: Payer: Self-pay | Admitting: Family Medicine

## 2019-08-09 ENCOUNTER — Other Ambulatory Visit: Payer: Self-pay | Admitting: Nurse Practitioner

## 2019-08-09 DIAGNOSIS — G8929 Other chronic pain: Secondary | ICD-10-CM

## 2019-08-09 DIAGNOSIS — D571 Sickle-cell disease without crisis: Secondary | ICD-10-CM

## 2019-08-09 LAB — CBC
HCT: 18 % — ABNORMAL LOW (ref 39.0–52.0)
Hemoglobin: 7 g/dL — ABNORMAL LOW (ref 13.0–17.0)
MCH: 39.1 pg — ABNORMAL HIGH (ref 26.0–34.0)
MCHC: 38.9 g/dL — ABNORMAL HIGH (ref 30.0–36.0)
MCV: 100.6 fL — ABNORMAL HIGH (ref 80.0–100.0)
Platelets: 294 10*3/uL (ref 150–400)
RBC: 1.79 MIL/uL — ABNORMAL LOW (ref 4.22–5.81)
RDW: 23 % — ABNORMAL HIGH (ref 11.5–15.5)
WBC: 12 10*3/uL — ABNORMAL HIGH (ref 4.0–10.5)
nRBC: 25.9 % — ABNORMAL HIGH (ref 0.0–0.2)

## 2019-08-09 MED ORDER — HYDROMORPHONE HCL 2 MG/ML IJ SOLN
2.0000 mg | Freq: Once | INTRAMUSCULAR | Status: AC
  Administered 2019-08-09: 2 mg via SUBCUTANEOUS
  Filled 2019-08-09: qty 1

## 2019-08-09 MED ORDER — OXYCODONE-ACETAMINOPHEN 10-325 MG PO TABS: 1.0000 | ORAL_TABLET | ORAL | 0 refills | Status: DC | PRN

## 2019-08-09 MED ORDER — ONDANSETRON 4 MG PO TBDP
4.0000 mg | ORAL_TABLET | Freq: Once | ORAL | Status: AC
  Administered 2019-08-09: 4 mg via ORAL
  Filled 2019-08-09: qty 1

## 2019-08-09 MED ORDER — HYDROMORPHONE HCL 2 MG/ML IJ SOLN
2.0000 mg | Freq: Once | INTRAMUSCULAR | Status: DC
  Filled 2019-08-09: qty 1

## 2019-08-09 MED FILL — OXYCODONE-APAP 10-325: 10-325 | 15 days supply | Qty: 90 | Fill #0

## 2019-08-09 NOTE — Telephone Encounter (Signed)
Pt wants refill on oxycodone/perocet. He wants it sent to [facility] outpatient pharmacy

## 2019-08-09 NOTE — Progress Notes (Signed)
Pt discharged home in stable condition. Discharge instructions given. Script sent to pharmacy of choice. No immediate questions or concerns at this time.  

## 2019-08-09 NOTE — Discharge Summary (Signed)
Physician Discharge Summary  Martin [name] QIW:[id number] DOB: June 14, 1990 DOA: 08/04/2019  PCP: Azzie Glatter, [name]  Admit date: 08/04/2019  Discharge date: 08/09/2019  Discharge Diagnoses:  Principal Problem:   Sickle cell anemia with crisis [facility]) Active Problems:   Generalized anxiety disorder   HIV (human immunodeficiency virus infection) ([place])   Chronic pain syndrome   History of pulmonary embolus (PE)   Chest congestion   Hypoxia   Discharge Condition: Stable  Disposition:  Follow-up Information    Azzie Glatter, [name] Follow up in 1 week(s).   Specialty: Family Medicine Contact information: [place] [address] [phone]          Pt is discharged home in good condition and is to follow up with Azzie Glatter, [name] this week to have labs evaluated. Martin [name] is instructed to increase activity slowly and balance with rest for the next few days, and use prescribed medication to complete treatment of pain  Diet: Regular Wt Readings from Last 3 Encounters:  08/04/19 68 kg  05/26/19 68 kg  04/23/19 68.4 kg    History of present illness:  Martin [name] is a 29 year old male with a medical history significant for sickle cell disease, chronic pain syndrome, opiate dependence and tolerance, HIV disease on antiretroviral therapy, history of PE on apixaban who presented to the sickle cell day infusion center with major complaints of generalized body pain that was consistent with his sickle cell pain crisis.  Patient states that pain intensity slowly increased from 1 night prior, until he could not take it anymore.  Pain persisted after taking home medications, there was no sustained relief.  He rates his pain at 10/10, constant, and throbbing.  No joint swelling or redness.  He denies any fever, cough, shortness of breath, chest pain, nausea, vomiting, or diarrhea.  He denies urinary symptoms.  He denies any sick contacts, recent  travel, or exposure to COVID-19.  Sickle cell day infusion center course: Patient was admitted to the day hospital and treated with standard protocol with IV fluid, IV Dilaudid via PCA, and other adjunct therapies.  IV Toradol was contraindicated due to allergy.  His laboratory investigations.  Normal limits, hemoglobin is stable at baseline.  Pain did not improve despite the above regimen, patient admitted to hospital for further evaluation and pain.  Pain management.  Hospital Course:  Sickle cell disease with pain crisis: Patient was admitted for sickle cell pain crisis and managed appropriately with IVF, IV Dilaudid via PCA  as well as other adjunct therapies per sickle cell pain management protocols. Pain intensity improved to 5/10.  Pain is primarily to low back, mid chest, and lower extremities.  Patient is requesting discharge home.  Patient is ambulating in halls without assistance.  Wean IV Dilaudid PCA appropriately, patient transition to home medications. Advised to resume all home medications. Hemoglobin 7.0, consistent with patient's baseline.  He did not warrant any blood transfusions during admission.  He is typically not transfused unless hemoglobin is less than 6.0 g/dL.  He is advised to follow-up with PCP in 1 week to repeat CBC. Patient advised to continue to hydrate to prevent any further sickling.  Patient has a long history of hypoxia.  He is on nocturnal oxygen at home at 2 L.  On ambulating pulse oximetry, patient remained above 90%.  Also, oxygen was weaned appropriately.  Chest x-ray improved, no acute abnormalities noted.  Patient has stable left upper lobe nodules and  pleural thickening.  Also, stable changes of COPD and chronic bronchitis with interstitial fibrosis.  Patient advised to follow-up with PCP.  Patient has a long history of HIV infection.  He consistently takes antiretroviral medications without interruption.  Patient advised to follow-up with infectious  disease specialist.  Patient also has a history of a pulmonary embolism and is on apixaban.  Patient often misses this medication.  Discussed the importance of taking this medication consistently in order to achieve positive outcomes.  Patient expressed understanding.  He is alert, oriented, and ambulating without assistance.  Patient will discharge home in hemodynamically stable condition.    Discharge Exam: Vitals:   08/09/19 0637 08/09/19 0948  BP: 109/69 (!) 106/57  Pulse: 86 89  Resp:  18  Temp:  98.4 F (36.9 C)  SpO2: 97% 92%   Vitals:   08/09/19 0156 08/09/19 0528 08/09/19 0637 08/09/19 0948  BP: 113/70 (!) 100/59 109/69 (!) 106/57  Pulse: 80 84 86 89  Resp: 18 17  18   Temp: 99.5 F (37.5 C) 99 F (37.2 C)  98.4 F (36.9 C)  TempSrc: Oral Oral  Oral  SpO2: 99% 100% 97% 92%  Weight:      Height:        General appearance : Awake, alert, not in any distress. Speech Clear. Not toxic looking HEENT: Atraumatic and Normocephalic, pupils equally reactive to light and accomodation Neck: Supple, no JVD. No cervical lymphadenopathy.  Chest: Good air entry bilaterally, no added sounds  CVS: S1 S2 regular, no murmurs.  Abdomen: Bowel sounds present, Non tender and not distended with no gaurding, rigidity or rebound. Extremities: B/L Lower Ext shows no edema, both legs are warm to touch Neurology: Awake alert, and oriented X 3, CN II-XII intact, Non focal Skin: No Rash  Discharge Instructions  Discharge Instructions    Discharge patient   Complete by: As directed    Discharge disposition: 01-Home or Self Care   Discharge patient date: 08/09/2019     Allergies as of 08/09/2019      Reactions   Tape Rash, Other (See Comments)   PLEASE DO NOT USE THE CLEAR, THICK, "PLASTIC" TAPE- only paper tape is tolerated    Ketorolac Swelling, Other (See Comments)   Patient reports facial edema and left arm edema after administration.      Medication List    TAKE these  medications   ALPRAZolam 1 MG tablet Commonly known as: XANAX Take 1 tablet by mouth 3 times daily as needed for anxiety or sleep.   apixaban 5 MG Tabs tablet Commonly known as: Eliquis Take 1 tablet (5 mg total) by mouth 2 (two) times daily.   cetirizine 10 MG tablet Commonly known as: ZYRTEC Take 1 tablet (10 mg total) by mouth daily.   cholecalciferol 25 MCG (1000 UNIT) tablet Commonly known as: VITAMIN D3 Take 1,000 Units by mouth at bedtime.   diphenhydrAMINE 25 MG tablet Commonly known as: BENADRYL Take 25 mg by mouth every 6 (six) hours as needed for allergies.   hydroxyurea 500 MG capsule Commonly known as: HYDREA Take 4 capsules (2,000 mg total) by mouth at bedtime.   multivitamin with minerals Tabs tablet Take 1 tablet by mouth daily with breakfast.   naloxone 0.4 MG/ML injection Commonly known as: NARCAN As needed. What changed:   how much to take  how to take this  when to take this  reasons to take this  additional instructions   oxyCODONE-acetaminophen 10-325 MG tablet Commonly  known as: PERCOCET Take 1 tablet by mouth every 4 (four) hours as needed for up to 15 days for pain.   Triumeq 600-50-300 MG tablet Generic drug: abacavir-dolutegravir-lamiVUDine Take 1 tablet by mouth at bedtime.   Vitamin D (Ergocalciferol) 1.25 MG (50000 UNIT) Caps capsule Commonly known as: DRISDOL Take 1 capsule (50,000 Units total) by mouth every 7 (seven) days.       The results of significant diagnostics from this hospitalization (including imaging, microbiology, ancillary and laboratory) are listed below for reference.    Significant Diagnostic Studies: DG Chest 2 View  Result Date: 08/08/2019 CLINICAL DATA:  Chest pain associated with a sickle cell crisis. EXAM: CHEST - 2 VIEW COMPARISON:  08/06/2019.  Chest CTA dated 02/10/2019. FINDINGS: The heart remains normal in size. The lungs remain hyperexpanded with stable diffuse peribronchial thickening and  accentuation of the interstitial markings. Two small areas of a increased density in the apical region of the left lung with adjacent pleural thickening are stable. These correspond to areas of nodularity seen on a CTA dated 02/10/2019. Stable linear scarring anteriorly in the lower lung zones on the lateral view. Unremarkable bones. IMPRESSION: 1. No acute abnormality. 2. Stable left upper lobe nodules and pleural thickening. 3. Stable changes of COPD and chronic bronchitis with interstitial fibrosis. Electronically Signed   By: Beckie Salts M.D.   On: 08/08/2019 16:49   DG Chest 2 View  Result Date: 08/06/2019 CLINICAL DATA:  Chest congestion. History of sickle cell, HIV. EXAM: CHEST - 2 VIEW COMPARISON:  Chest x-ray dated 04/23/2019. FINDINGS: Heart size and mediastinal contours are stable. Coarse lung markings are again seen bilaterally, not significantly changed. Questionable new small opacity within the LEFT upper lung. No pleural effusion or pneumothorax is seen. IMPRESSION: 1. Questionable new small opacity within the LEFT upper lung, which could represent early pneumonia or asymmetric edema. 2. Chronic interstitial lung disease/fibrosis. Electronically Signed   By: Bary Richard M.D.   On: 08/06/2019 14:26    Microbiology: Recent Results (from the past 240 hour(s))  Respiratory Panel by RT PCR (Flu A&B, Covid) - Nasopharyngeal Swab     Status: None   Collection Time: 08/04/19  6:11 PM   Specimen: Nasopharyngeal Swab  Result Value Ref Range Status   SARS Coronavirus 2 by RT PCR NEGATIVE NEGATIVE Final    Comment: (NOTE) SARS-CoV-2 target nucleic acids are NOT DETECTED. The SARS-CoV-2 RNA is generally detectable in upper respiratoy specimens during the acute phase of infection. The lowest concentration of SARS-CoV-2 viral copies this assay can detect is 131 copies/mL. A negative result does not preclude SARS-Cov-2 infection and should not be used as the sole basis for treatment or other  patient management decisions. A negative result may occur with  improper specimen collection/handling, submission of specimen other than nasopharyngeal swab, presence of viral mutation(s) within the areas targeted by this assay, and inadequate number of viral copies (<131 copies/mL). A negative result must be combined with clinical observations, patient history, and epidemiological information. The expected result is Negative. Fact Sheet for Patients:  https://www.moore.com/ Fact Sheet for Healthcare Providers:  https://www.young.biz/ This test is not yet ap proved or cleared by the [place] FDA and  has been authorized for detection and/or diagnosis of SARS-CoV-2 by FDA under an Emergency Use Authorization (EUA). This EUA will remain  in effect (meaning this test can be used) for the duration of the COVID-19 declaration under Section 564(b)(1) of the Act, 21 U.S.C. section 360bbb-3(b)(1), unless the authorization  is terminated or revoked sooner.    Influenza A by PCR NEGATIVE NEGATIVE Final   Influenza B by PCR NEGATIVE NEGATIVE Final    Comment: (NOTE) The Xpert Xpress SARS-CoV-2/FLU/RSV assay is intended as an aid in  the diagnosis of influenza from Nasopharyngeal swab specimens and  should not be used as a sole basis for treatment. Nasal washings and  aspirates are unacceptable for Xpert Xpress SARS-CoV-2/FLU/RSV  testing. Fact Sheet for Patients: https://www.moore.com/ Fact Sheet for Healthcare Providers: https://www.young.biz/ This test is not yet approved or cleared by the [place] FDA and  has been authorized for detection and/or diagnosis of SARS-CoV-2 by  FDA under an Emergency Use Authorization (EUA). This EUA will remain  in effect (meaning this test can be used) for the duration of the  Covid-19 declaration under Section 564(b)(1) of the Act, 21  U.S.C. section 360bbb-3(b)(1), unless  the authorization is  terminated or revoked. Performed at [facility], [address], [place], [place] [address]      Labs: Basic Metabolic Panel: Recent Labs  Lab 08/04/19 1023 08/04/19 1023 08/07/19 0503 08/08/19 0740  NA 136  --  133* 136  K 4.6   < > 4.3 4.3  CL 105  --  100 103  CO2 23  --  26 25  GLUCOSE 85  --  95 116*  BUN 9  --  12 13  CREATININE 0.60*  --  0.74 0.84  CALCIUM 8.7*  --  8.5* 8.8*   < > = values in this interval not displayed.   Liver Function Tests: Recent Labs  Lab 08/04/19 1023 08/07/19 0503  AST 53* 51*  ALT 25 20  ALKPHOS 60 52  BILITOT 10.1* 7.0*  PROT 9.4* 8.0  ALBUMIN 3.7 3.2*   No results for input(s): LIPASE, AMYLASE in the last 168 hours. No results for input(s): AMMONIA in the last 168 hours. CBC: Recent Labs  Lab 08/04/19 1023 08/06/19 0522 08/07/19 0503 08/08/19 0740 08/09/19 0759  WBC 12.3* 12.9* 14.0* 14.2* 12.0*  NEUTROABS 5.7 5.1 5.3  --   --   HGB 8.4* 7.4* 7.0* 7.1* 7.0*  HCT 22.9* 20.9* 17.6* 19.0* 18.0*  MCV 101.8* 106.6* 101.9* 99.0 100.6*  PLT 355 356 325 289 294   Cardiac Enzymes: No results for input(s): CKTOTAL, CKMB, CKMBINDEX, TROPONINI in the last 168 hours. BNP: Invalid input(s): POCBNP CBG: Recent Labs  Lab 08/08/19 2212  GLUCAP 98    Time coordinating discharge: 30 minutes  Signed:  [name]  APRN, [name], [name] Patient Care [facility] [address] [place], [place] [address] [phone]  [facility] 08/09/2019, 1:38 PM

## 2019-08-09 NOTE — Discharge Instructions (Signed)
It is recommended that you continue your home oxygen as previously ordered. You do not warrant further antibiotics at this time. Follow-up with your primary care provider in 1 week to repeat labs. Continue to hydrate consistently   Sickle Cell Anemia, Adult  Sickle cell anemia is a condition where your red blood cells are shaped like sickles. Red blood cells carry oxygen through the body. Sickle-shaped cells do not live as long as normal red blood cells. They also clump together and block blood from flowing through the blood vessels. This prevents the body from getting enough oxygen. Sickle cell anemia causes organ damage and pain. It also increases the risk of infection. Follow these instructions at home: Medicines  Take over-the-counter and prescription medicines only as told by your doctor.  If you were prescribed an antibiotic medicine, take it as told by your doctor. Do not stop taking the antibiotic even if you start to feel better.  If you develop a fever, do not take medicines to lower the fever right away. Tell your doctor about the fever. Managing pain, stiffness, and swelling  Try these methods to help with pain: ? Use a heating pad. ? Take a warm bath. ? Distract yourself, such as by watching TV. Eating and drinking  Drink enough fluid to keep your pee (urine) clear or pale yellow. Drink more in hot weather and during exercise.  Limit or avoid alcohol.  Eat a healthy diet. Eat plenty of fruits, vegetables, whole grains, and lean protein.  Take vitamins and supplements as told by your doctor. Traveling  When traveling, keep these with you: ? Your medical information. ? The names of your doctors. ? Your medicines.  If you need to take an airplane, talk to your doctor first. Activity  Rest often.  Avoid exercises that make your heart beat much faster, such as jogging. General instructions  Do not use products that have nicotine or tobacco, such as cigarettes  and e-cigarettes. If you need help quitting, ask your doctor.  Consider wearing a medical alert bracelet.  Avoid being in high places (high altitudes), such as mountains.  Avoid very hot or cold temperatures.  Avoid places where the temperature changes a lot.  Keep all follow-up visits as told by your doctor. This is important. Contact a doctor if:  A joint hurts.  Your feet or hands hurt or swell.  You feel tired (fatigued). Get help right away if:  You have symptoms of infection. These include: ? Fever. ? Chills. ? Being very tired. ? Irritability. ? Poor eating. ? Throwing up (vomiting).  You feel dizzy or faint.  You have new stomach pain, especially on the left side.  You have a an erection (priapism) that lasts more than 4 hours.  You have numbness in your arms or legs.  You have a hard time moving your arms or legs.  You have trouble talking.  You have pain that does not go away when you take medicine.  You are short of breath.  You are breathing fast.  You have a long-term cough.  You have pain in your chest.  You have a bad headache.  You have a stiff neck.  Your stomach looks bloated even though you did not eat much.  Your skin is pale.  You suddenly cannot see well. Summary  Sickle cell anemia is a condition where your red blood cells are shaped like sickles.  Follow your doctor's advice on ways to manage pain, food to eat,  activities to do, and steps to take for safe travel.  Get medical help right away if you have any signs of infection, such as a fever. This information is not intended to replace advice given to you by your health care provider. Make sure you discuss any questions you have with your health care provider. Document Revised: 07/29/2018 Document Reviewed: 05/12/2016 Elsevier Patient Education  [place].

## 2019-08-09 NOTE — Progress Notes (Signed)
Pharmacy Antibiotic Note  Martin [name] is a 29 y.o. male admitted on 08/04/2019 with sickle cell pain crisis.  Pharmacy has been consulted for empiric cefepime dosing for possible PNA.  08/09/19 10:09 AM  - WBC 12, afebrile  Plan: Continue cefepime 2 g iv q 8h - F/U renal function and plans for antibiotics   Height: 6\' 3"  (190.5 cm) Weight: 68 kg (149 lb 14.6 oz) IBW/kg (Calculated) : 84.5  Temp (24hrs), Avg:99 F (37.2 C), Min:98.4 F (36.9 C), Max:99.6 F (37.6 C)  Recent Labs  Lab 08/04/19 1023 08/06/19 0522 08/07/19 0503 08/08/19 0740 08/09/19 0759  WBC 12.3* 12.9* 14.0* 14.2* 12.0*  CREATININE 0.60*  --  0.74 0.84  --     Estimated Creatinine Clearance: 124.8 mL/min (by C-G formula based on SCr of 0.84 mg/dL).    Allergies  Allergen Reactions  . Tape Rash and Other (See Comments)    PLEASE DO NOT USE THE CLEAR, THICK, "PLASTIC" TAPE- only paper tape is tolerated   . Ketorolac Swelling and Other (See Comments)    Patient reports facial edema and left arm edema after administration.    Thank you for allowing pharmacy to be a part of this patient's care.  08/11/19 D 08/09/2019 10:08 AM

## 2019-08-15 ENCOUNTER — Ambulatory Visit (INDEPENDENT_AMBULATORY_CARE_PROVIDER_SITE_OTHER): Payer: Medicaid Other | Admitting: Family Medicine

## 2019-08-15 ENCOUNTER — Encounter: Payer: Self-pay | Admitting: Family Medicine

## 2019-08-15 ENCOUNTER — Other Ambulatory Visit: Payer: Self-pay

## 2019-08-15 VITALS — BP 110/81 | HR 100 | Temp 99.0°F | Ht 75.0 in | Wt 141.2 lb

## 2019-08-15 DIAGNOSIS — F411 Generalized anxiety disorder: Secondary | ICD-10-CM

## 2019-08-15 DIAGNOSIS — D571 Sickle-cell disease without crisis: Secondary | ICD-10-CM | POA: Diagnosis not present

## 2019-08-15 DIAGNOSIS — F1190 Opioid use, unspecified, uncomplicated: Secondary | ICD-10-CM | POA: Insufficient documentation

## 2019-08-15 DIAGNOSIS — Z09 Encounter for follow-up examination after completed treatment for conditions other than malignant neoplasm: Secondary | ICD-10-CM

## 2019-08-15 DIAGNOSIS — G8929 Other chronic pain: Secondary | ICD-10-CM

## 2019-08-15 DIAGNOSIS — Z452 Encounter for adjustment and management of vascular access device: Secondary | ICD-10-CM

## 2019-08-15 DIAGNOSIS — J302 Other seasonal allergic rhinitis: Secondary | ICD-10-CM

## 2019-08-15 LAB — POCT URINALYSIS DIPSTICK
Bilirubin, UA: NEGATIVE
Blood, UA: NEGATIVE
Glucose, UA: NEGATIVE
Ketones, UA: NEGATIVE
Leukocytes, UA: NEGATIVE
Nitrite, UA: NEGATIVE
Protein, UA: NEGATIVE
Spec Grav, UA: 1.02 (ref 1.010–1.025)
Urobilinogen, UA: 4 E.U./dL — AB
pH, UA: 6.5 (ref 5.0–8.0)

## 2019-08-15 NOTE — Progress Notes (Signed)
Patient [name] and [facility] Follow Up   Subjective:  Patient ID: Martin [name], male    DOB: 1990/10/22  Age: 29 y.o. MRN: [id number]  CC:  Chief Complaint  Patient presents with  . Hospitalization Follow-up    [facility] Discharge 08/04/2019    HPI Martin [name] is a 29 year old male who presents for Hospital Follow Up today.   Past Medical History:  Diagnosis Date  . Anxiety   . HIV (human immunodeficiency virus infection) ([place])   . Sickle cell crisis ([place])   . 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[place])   . Vitamin D deficiency 10/2018   Current Status: Since his last office visit, he is doing well with no complaints. He states that he has pain in his lower back and hips he rates his pain today at 5/10. He has not had a hospital visit for Sickle Cell Crisis since 08/04/2019 where he was treated and discharged the same day. He is currently taking all medications as prescribed and staying well hydrated. He reports occasional nausea, constipation, dizziness and headaches. His anxiety is stable today. He denies suicidal ideations, homicidal ideations, or auditory hallucinations. He continues to follow up with Infection Disease. He has recently had his Port-a-cath removed because of infection in February. He denies fevers, chills, recent infections, weight loss, and night sweats. He has not had any visual changes, and falls. No chest pain, heart palpitations, cough and shortness of breath reported. Denies GI problems such as vomiting, and diarrhea. He has no reports of blood in stools, dysuria and hematuria. He is taking all medications as prescribed.   History reviewed. No pertinent surgical history.  Family History  Problem Relation Age of Onset  . Sickle cell trait Mother   . Sickle cell trait Father   . Birth defects Maternal Grandmother   . Birth defects Paternal Grandmother     Social History   Socioeconomic History  .  Marital status: Single    Spouse name: Not on file  . Number of children: Not on file  . Years of education: Not on file  . Highest education level: Not on file  Occupational History  . Not on file  Tobacco Use  . Smoking status: Never Smoker  . Smokeless tobacco: Never Used  Substance and Sexual Activity  . Alcohol use: No  . Drug use: No  . Sexual activity: Yes    Birth control/protection: Condom  Other Topics Concern  . Not on file  Social History Narrative  . Not on file   Social Determinants of Health   Financial Resource Strain:   . Difficulty of Paying Living Expenses:   Food Insecurity:   . Worried About Charity fundraiser in the Last Year:   . Arboriculturist in the Last Year:   Transportation Needs:   . Film/video editor (Medical):   Marland Kitchen Lack of Transportation (Non-Medical):   Physical Activity:   . Days of Exercise per Week:   . Minutes of Exercise per Session:   Stress:   . Feeling of Stress :   Social Connections:   . Frequency of Communication with Friends and Family:   . Frequency of Social Gatherings with Friends and Family:   . Attends Religious Services:   . Active Member of Clubs or Organizations:   . Attends Archivist Meetings:   Marland Kitchen Marital Status:   Intimate Partner Violence:   .  Fear of Current or Ex-Partner:   . Emotionally Abused:   Marland Kitchen Physically Abused:   . Sexually Abused:     Outpatient Medications Prior to Visit  Medication Sig Dispense Refill  . abacavir-dolutegravir-lamiVUDine (TRIUMEQ) 600-50-300 MG tablet Take 1 tablet by mouth at bedtime.     . ALPRAZolam (XANAX) 1 MG tablet Take 1 tablet by mouth 3 times daily as needed for anxiety or sleep. 30 tablet 0  . cetirizine (ZYRTEC) 10 MG tablet Take 1 tablet (10 mg total) by mouth daily. 30 tablet 11  . cholecalciferol (VITAMIN D3) 25 MCG (1000 UNIT) tablet Take 1,000 Units by mouth at bedtime.    . diphenhydrAMINE (BENADRYL) 25 MG tablet Take 25 mg by mouth every 6 (six)  hours as needed for allergies.    . hydroxyurea (HYDREA) 500 MG capsule Take 4 capsules (2,000 mg total) by mouth at bedtime. 120 capsule 3  . Multiple Vitamin (MULTIVITAMIN WITH MINERALS) TABS tablet Take 1 tablet by mouth daily with breakfast.     . oxyCODONE-acetaminophen (PERCOCET) 10-325 MG tablet Take 1 tablet by mouth every 4 (four) hours as needed for up to 15 days for pain. 90 tablet 0  . Vitamin D, Ergocalciferol, (DRISDOL) 1.25 MG (50000 UT) CAPS capsule Take 1 capsule (50,000 Units total) by mouth every 7 (seven) days. 5 capsule 3  . apixaban (ELIQUIS) 5 MG TABS tablet Take 1 tablet (5 mg total) by mouth 2 (two) times daily. (Patient not taking: Reported on 08/04/2019) 60 tablet 5  . naloxone (NARCAN) 0.4 MG/ML injection As needed. (Patient not taking: Reported on 08/15/2019) 1 mL 2   No facility-administered medications prior to visit.    Allergies  Allergen Reactions  . Tape Rash and Other (See Comments)    PLEASE DO NOT USE THE CLEAR, THICK, "PLASTIC" TAPE- only paper tape is tolerated   . Ketorolac Swelling and Other (See Comments)    Patient reports facial edema and left arm edema after administration.    ROS Review of Systems  Constitutional: Negative.   HENT: Negative.   Eyes: Negative.   Respiratory: Positive for shortness of breath (occasional ).   Cardiovascular: Negative.   Gastrointestinal: Negative.   Endocrine: Negative.   Genitourinary: Negative.   Musculoskeletal: Positive for arthralgias (generalized joint pain).  Skin: Negative.   Allergic/Immunologic: Negative.   Neurological: Positive for dizziness (occasional ) and headaches (occasional ).  Hematological: Negative.   Psychiatric/Behavioral: Negative.    Objective:    Physical Exam  Constitutional: He is oriented to person, place, and time. He appears well-developed and well-nourished.  HENT:  Head: Normocephalic and atraumatic.  Eyes: Conjunctivae are normal.  Cardiovascular: Normal rate,  regular rhythm, normal heart sounds and intact distal pulses.  Pulmonary/Chest: Effort normal and breath sounds normal.  Abdominal: Soft. Bowel sounds are normal.  Musculoskeletal:        General: Normal range of motion.     Cervical back: Normal range of motion and neck supple.  Neurological: He is alert and oriented to person, place, and time. He has normal reflexes.  Skin: Skin is warm and dry.  Nursing note and vitals reviewed.   BP 110/81   Pulse 100   Temp 99 F (37.2 C)   Ht 6\' 3"  (1.905 m)   Wt 141 lb 3.2 oz (64 kg)   SpO2 90%   BMI 17.65 kg/m  Wt Readings from Last 3 Encounters:  08/15/19 141 lb 3.2 oz (64 kg)  08/04/19 149 lb 14.6  oz (68 kg)  05/26/19 150 lb (68 kg)     There are no preventive care reminders to display for this patient.  There are no preventive care reminders to display for this patient.  No results found for: TSH Lab Results  Component Value Date   WBC 12.0 (H) 08/09/2019   HGB 7.0 (L) 08/09/2019   HCT 18.0 (L) 08/09/2019   MCV 100.6 (H) 08/09/2019   PLT 294 08/09/2019   Lab Results  Component Value Date   NA 136 08/08/2019   K 4.3 08/08/2019   CO2 25 08/08/2019   GLUCOSE 116 (H) 08/08/2019   BUN 13 08/08/2019   CREATININE 0.84 08/08/2019   BILITOT 7.0 (H) 08/07/2019   ALKPHOS 52 08/07/2019   AST 51 (H) 08/07/2019   ALT 20 08/07/2019   PROT 8.0 08/07/2019   ALBUMIN 3.2 (L) 08/07/2019   CALCIUM 8.8 (L) 08/08/2019   ANIONGAP 8 08/08/2019   No results found for: CHOL No results found for: HDL No results found for: LDLCALC No results found for: [name] No results found for: CHOLHDL Lab Results  Component Value Date   HGBA1C (L) 05/22/2009    <4.0 (NOTE) The ADA recommends the following therapeutic goal for glycemic control related to Hgb A1c measurement: Goal of therapy: <6.5 Hgb A1c  Reference: American Diabetes Association: Clinical Practice Recommendations 2010, Diabetes Care, 2010, 33: (Suppl  1).      Assessment & Plan:     1. Hospital discharge follow-up  2. Hb-SS disease without crisis [facility]) He is doing well today r/t his chronic pain management. He will continue to take pain medications as prescribed; will continue to avoid extreme heat and cold; will continue to eat a healthy diet and drink at least 64 ounces of water daily; continue stool softener as needed; will avoid colds and flu; will continue to get plenty of sleep and rest; will continue to avoid high stressful situations and remain infection free; will continue Folic Acid 1 mg daily to avoid sickle cell crisis. Continue to follow up with Hematologist as needed.  - POCT urinalysis dipstick - Ambulatory referral to Hematology  3. Chronic, continuous use of opioids - POCT Urine Drug Screen  4. Other chronic pain  5. Generalized anxiety disorder Stable today.   6. Seasonal allergies  7. Encounter for central line placement - IR IMAGING GUIDED PORT INSERTION; Future  8. Follow up He will follow up in 2 months.   No orders of the defined types were placed in this encounter.  Orders Placed This Encounter  Procedures  . IR IMAGING GUIDED PORT INSERTION  . Ambulatory referral to Hematology  . POCT urinalysis dipstick  . POCT Urine Drug Screen     Referral Orders     Ambulatory referral to Hematology   [name],  [name], [name]-BC [facility] [address] [place], [place] [address] [phone] fax    No orders of the defined types were placed in this encounter.    Problem List Items Addressed This Visit      Other   Generalized anxiety disorder   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HCC)   Relevant Orders   POCT urinalysis dipstick (Completed)   Ambulatory referral to Hematology    Other Visit Diagnoses    Hospital discharge follow-up    -  Primary   Chronic, continuous use of opioids       Relevant Orders   POCT Urine  Drug Screen   Other chronic  pain       Seasonal allergies       Encounter for central line placement       Relevant Orders   IR IMAGING GUIDED PORT INSERTION   Follow up          No orders of the defined types were placed in this encounter.   Follow-up: Return in about 2 months (around 10/15/2019).    [name], [name]

## 2019-08-16 LAB — DRUG SCREEN, URINE
Amphetamines, Urine: NEGATIVE ng/mL
Barbiturate screen, urine: NEGATIVE ng/mL
Benzodiazepine Quant, Ur: NEGATIVE ng/mL
Cannabinoid Quant, Ur: POSITIVE ng/mL — AB
Cocaine (Metab.): NEGATIVE ng/mL
Opiate Quant, Ur: POSITIVE ng/mL — AB
PCP Quant, Ur: NEGATIVE ng/mL

## 2019-08-21 ENCOUNTER — Other Ambulatory Visit: Payer: Self-pay

## 2019-08-21 ENCOUNTER — Telehealth (HOSPITAL_COMMUNITY): Payer: Self-pay | Admitting: *Deleted

## 2019-08-21 DIAGNOSIS — I2699 Other pulmonary embolism without acute cor pulmonale: Secondary | ICD-10-CM

## 2019-08-21 MED ORDER — APIXABAN 5 MG PO TABS: 5.0000 mg | ORAL_TABLET | Freq: Two times a day (BID) | ORAL | 5 refills | Status: DC

## 2019-08-21 NOTE — Telephone Encounter (Signed)
Patient called requesting to come to the day hospital for sickle cell pain. Patient reports generalized pain rated 10/10. Reports taking Percocet at 6:00 am. Patient was seen in the ED last night for pain crisis. COVID-19 screening done and patient denies all symptoms and exposures. Denies fever, nausea, vomiting, diarrhea, abdominal pain and priapism. Patient admits to having some left side chest pain which started this morning. Patient reports chest pain is aggravated by coughing and deep breathing. [place], [name] notified and advised patient to go the ED to rule out acute chest or PE. Patient has a history of both. Patient can transition to the day hospital if everything checks out in the ED. Patient advised and expresses an understanding.

## 2019-08-22 ENCOUNTER — Telehealth: Payer: Self-pay | Admitting: Family Medicine

## 2019-08-22 NOTE — Telephone Encounter (Signed)
Pt called in refill on percocet and xanax °

## 2019-08-23 ENCOUNTER — Other Ambulatory Visit: Payer: Self-pay | Admitting: Family Medicine

## 2019-08-23 ENCOUNTER — Telehealth: Payer: Self-pay | Admitting: Family Medicine

## 2019-08-23 DIAGNOSIS — G8929 Other chronic pain: Secondary | ICD-10-CM

## 2019-08-23 DIAGNOSIS — F419 Anxiety disorder, unspecified: Secondary | ICD-10-CM

## 2019-08-23 DIAGNOSIS — D571 Sickle-cell disease without crisis: Secondary | ICD-10-CM

## 2019-08-23 MED ORDER — ALPRAZOLAM 1 MG PO TABS: ORAL_TABLET | ORAL | 0 refills | Status: DC

## 2019-08-23 MED ORDER — OXYCODONE-ACETAMINOPHEN 10-325 MG PO TABS: 1.0000 | ORAL_TABLET | ORAL | 0 refills | Status: DC | PRN

## 2019-08-23 NOTE — Telephone Encounter (Signed)
Done

## 2019-08-25 ENCOUNTER — Other Ambulatory Visit: Payer: Self-pay | Admitting: Radiology

## 2019-08-28 ENCOUNTER — Other Ambulatory Visit: Payer: Self-pay

## 2019-08-28 ENCOUNTER — Ambulatory Visit (HOSPITAL_COMMUNITY)
Admission: RE | Admit: 2019-08-28 | Discharge: 2019-08-28 | Disposition: A | Discharge status: Home / Self Care | Payer: Medicaid Other | Source: Ambulatory Visit | Attending: Family Medicine | Admitting: Family Medicine

## 2019-08-28 DIAGNOSIS — Z452 Encounter for adjustment and management of vascular access device: Secondary | ICD-10-CM

## 2019-08-28 MED ORDER — SODIUM CHLORIDE 0.9 % IV SOLN: INTRAVENOUS | Status: DC

## 2019-08-28 MED ORDER — CEFAZOLIN SODIUM-DEXTROSE 2-4 GM/100ML-% IV SOLN: 2.0000 g | INTRAVENOUS | Status: DC

## 2019-08-28 NOTE — Progress Notes (Signed)
Patient ID: Martin [name], male   DOB: [birth year], 29 y.o.   MRN: [id number]  Patient presented to IR department today for Port-A-Cath placement.  Last dose of Eliquis was yesterday morning.  Procedure has been rescheduled for 5/11 PM.  Patient/nurse updated.  Patient given preprocedure instructions.

## 2019-08-29 ENCOUNTER — Ambulatory Visit (HOSPITAL_COMMUNITY)
Admission: RE | Admit: 2019-08-29 | Discharge: 2019-08-29 | Disposition: A | Discharge status: Home / Self Care | Payer: Medicaid Other | Source: Ambulatory Visit | Attending: Family Medicine | Admitting: Family Medicine

## 2019-08-29 ENCOUNTER — Encounter (HOSPITAL_COMMUNITY): Payer: Self-pay

## 2019-08-29 DIAGNOSIS — D571 Sickle-cell disease without crisis: Secondary | ICD-10-CM | POA: Diagnosis not present

## 2019-08-29 DIAGNOSIS — Z832 Family history of diseases of the blood and blood-forming organs and certain disorders involving the immune mechanism: Secondary | ICD-10-CM | POA: Insufficient documentation

## 2019-08-29 DIAGNOSIS — Z888 Allergy status to other drugs, medicaments and biological substances status: Secondary | ICD-10-CM | POA: Insufficient documentation

## 2019-08-29 DIAGNOSIS — I998 Other disorder of circulatory system: Secondary | ICD-10-CM | POA: Insufficient documentation

## 2019-08-29 DIAGNOSIS — F419 Anxiety disorder, unspecified: Secondary | ICD-10-CM | POA: Diagnosis not present

## 2019-08-29 DIAGNOSIS — B20 Human immunodeficiency virus [HIV] disease: Secondary | ICD-10-CM | POA: Diagnosis not present

## 2019-08-29 DIAGNOSIS — Z79899 Other long term (current) drug therapy: Secondary | ICD-10-CM | POA: Diagnosis not present

## 2019-08-29 DIAGNOSIS — Z7901 Long term (current) use of anticoagulants: Secondary | ICD-10-CM | POA: Diagnosis not present

## 2019-08-29 HISTORY — PX: IR IMAGING GUIDED PORT INSERTION: IMG5740

## 2019-08-29 LAB — PROTIME-INR
INR: 1.2 (ref 0.8–1.2)
Prothrombin Time: 15.1 seconds (ref 11.4–15.2)

## 2019-08-29 LAB — CBC
HCT: 19.4 % — ABNORMAL LOW (ref 39.0–52.0)
Hemoglobin: 7.5 g/dL — ABNORMAL LOW (ref 13.0–17.0)
MCH: 46.3 pg — ABNORMAL HIGH (ref 26.0–34.0)
MCHC: 38.7 g/dL — ABNORMAL HIGH (ref 30.0–36.0)
MCV: 119.8 fL — ABNORMAL HIGH (ref 80.0–100.0)
Platelets: 302 10*3/uL (ref 150–400)
RBC: 1.62 MIL/uL — ABNORMAL LOW (ref 4.22–5.81)
RDW: 25.1 % — ABNORMAL HIGH (ref 11.5–15.5)
WBC: 9.4 10*3/uL (ref 4.0–10.5)
nRBC: 63.8 % — ABNORMAL HIGH (ref 0.0–0.2)

## 2019-08-29 MED ORDER — MIDAZOLAM HCL 2 MG/2ML IJ SOLN
INTRAMUSCULAR | Status: AC | PRN
  Administered 2019-08-29 (×4): 1 mg via INTRAVENOUS

## 2019-08-29 MED ORDER — LIDOCAINE-EPINEPHRINE 1 %-1:100000 IJ SOLN
INTRAMUSCULAR | Status: AC
  Filled 2019-08-29: qty 1

## 2019-08-29 MED ORDER — DIPHENHYDRAMINE HCL 50 MG/ML IJ SOLN
INTRAMUSCULAR | Status: AC | PRN
  Administered 2019-08-29: 25 mg via INTRAVENOUS

## 2019-08-29 MED ORDER — CEFAZOLIN SODIUM-DEXTROSE 2-4 GM/100ML-% IV SOLN: 2.0000 g | Freq: Once | INTRAVENOUS | Status: DC

## 2019-08-29 MED ORDER — MIDAZOLAM HCL 2 MG/2ML IJ SOLN
INTRAMUSCULAR | Status: AC
  Filled 2019-08-29: qty 6

## 2019-08-29 MED ORDER — FENTANYL CITRATE (PF) 100 MCG/2ML IJ SOLN
INTRAMUSCULAR | Status: AC
  Filled 2019-08-29: qty 4

## 2019-08-29 MED ORDER — CEFAZOLIN SODIUM-DEXTROSE 2-4 GM/100ML-% IV SOLN
INTRAVENOUS | Status: AC
  Administered 2019-08-29: 2 g via INTRAVENOUS
  Filled 2019-08-29: qty 100

## 2019-08-29 MED ORDER — SODIUM CHLORIDE 0.9 % IV SOLN: INTRAVENOUS | Status: DC

## 2019-08-29 MED ORDER — HEPARIN SOD (PORK) LOCK FLUSH 100 UNIT/ML IV SOLN
INTRAVENOUS | Status: AC
  Filled 2019-08-29: qty 5

## 2019-08-29 MED ORDER — DIPHENHYDRAMINE HCL 50 MG/ML IJ SOLN
INTRAMUSCULAR | Status: AC
  Filled 2019-08-29: qty 1

## 2019-08-29 MED ORDER — LIDOCAINE-EPINEPHRINE (PF) 1 %-1:200000 IJ SOLN
INTRAMUSCULAR | Status: AC | PRN
  Administered 2019-08-29: 20 mL

## 2019-08-29 MED ORDER — FENTANYL CITRATE (PF) 100 MCG/2ML IJ SOLN
INTRAMUSCULAR | Status: AC | PRN
  Administered 2019-08-29 (×2): 50 ug via INTRAVENOUS

## 2019-08-29 NOTE — Discharge Instructions (Signed)
Urgent needs - IR on call MD [phone]  Wound - May remove dressing and shower in 24 to 48 hours.  Keep site clean and dry.  Replace with bandaid. Do not submerge in tub or water until site healing well.  If ordered by your provider, may start Emla cream in 2 weeks or after incision is healed.  After completion of treatment, your provider should have you set up for monthly port flushes.    Implanted Port Insertion, Care After This sheet gives you information about how to care for yourself after your procedure. Your health care provider may also give you more specific instructions. If you have problems or questions, contact your health care provider. What can I expect after the procedure? After the procedure, it is common to have:  Discomfort at the port insertion site.  Bruising on the skin over the port. This should improve over 3-4 days. Follow these instructions at home: Port care  After your port is placed, you will get a manufacturer's information card. The card has information about your port. Keep this card with you at all times.  Take care of the port as told by your health care provider. Ask your health care provider if you or a family member can get training for taking care of the port at home. A home health care nurse may also take care of the port.  Make sure to remember what type of port you have. Incision care  Follow instructions from your health care provider about how to take care of your port insertion site. Make sure you: ? Wash your hands with soap and water before and after you change your bandage (dressing). If soap and water are not available, use hand sanitizer. ? Change your dressing as told by your health care provider. ? Leave stitches (sutures), skin glue, or adhesive strips in place. These skin closures may need to stay in place for 2 weeks or longer. If adhesive strip edges start to loosen and curl up, you may trim the loose edges. Do not remove adhesive  strips completely unless your health care provider tells you to do that.  Check your port insertion site every day for signs of infection. Check for: ? Redness, swelling, or pain. ? Fluid or blood. ? Warmth. ? Pus or a bad smell. Activity  Return to your normal activities as told by your health care provider. Ask your health care provider what activities are safe for you.  Do not lift anything that is heavier than 10 lb (4.5 kg), or the limit that you are told, until your health care provider says that it is safe. General instructions  Take over-the-counter and prescription medicines only as told by your health care provider.  Do not take baths, swim, or use a hot tub until your health care provider approves. Ask your health care provider if you may take showers. You may only be allowed to take sponge baths.  Do not drive for 24 hours if you were given a sedative during your procedure.  Wear a medical alert bracelet in case of an emergency. This will tell any health care providers that you have a port.  Keep all follow-up visits as told by your health care provider. This is important. Contact a health care provider if:  You cannot flush your port with saline as directed, or you cannot draw blood from the port.  You have a fever or chills.  You have redness, swelling, or pain around your port   insertion site.  You have fluid or blood coming from your port insertion site.  Your port insertion site feels warm to the touch.  You have pus or a bad smell coming from the port insertion site. Get help right away if:  You have chest pain or shortness of breath.  You have bleeding from your port that you cannot control. Summary  Take care of the port as told by your health care provider. Keep the manufacturer's information card with you at all times.  Change your dressing as told by your health care provider.  Contact a health care provider if you have a fever or chills or if you  have redness, swelling, or pain around your port insertion site.  Keep all follow-up visits as told by your health care provider. This information is not intended to replace advice given to you by your health care provider. Make sure you discuss any questions you have with your health care provider. Document Revised: 11/02/2017 Document Reviewed: 11/02/2017 Elsevier Patient Education  2020 Else[employer] Inc[employer]   Moderate Conscious Sedation, Adult, Care After These instructions provide you with information about caring for yourself after your procedure. Your health care provider may also give you more specific instructions. Your treatment has been planned according to current medical practices, but problems sometimes occur. Call your health care provider if you have any problems or questions after your procedure. What can I expect after the procedure? After your procedure, it is common:  To feel sleepy for several hours.  To feel clumsy and have poor balance for several hours.  To have poor judgment for several hours.  To vomit if you eat too soon. Follow these instructions at home: For at least 24 hours after the procedure:  Do not: ? Participate in activities where you could fall or become injured. ? Drive. ? Use heavy machinery. ? Drink alcohol. ? Take sleeping pills or medicines that cause drowsiness. ? Make important decisions or sign legal documents. ? Take care of children on your own.  Rest. Eating and drinking  Follow the diet recommended by your health care provider.  If you vomit: ? Drink water, juice, or soup when you can drink without vomiting. ? Make sure you have little or no nausea before eating solid foods. General instructions  Have a responsible adult stay with you until you are awake and alert.  Take over-the-counter and prescription medicines only as told by your health care provider.  If you smoke, do not smoke without supervision.  Keep all follow-up  visits as told by your health care provider. This is important. Contact a health care provider if:  You keep feeling nauseous or you keep vomiting.  You feel light-headed.  You develop a rash.  You have a fever. Get help right away if:  You have trouble breathing. This information is not intended to replace advice given to you by your health care provider. Make sure you discuss any questions you have with your health care provider. Document Revised: 03/19/2017 Document Reviewed: 07/27/2015 Elsevier Patient Education  2020 Else[employer] Inc.   

## 2019-08-29 NOTE — H&P (Signed)
Chief Complaint: Patient was seen in consultation today for port placement  Referring Physician(s): Azzie Glatter  Supervising Physician: Sandi Mariscal  Patient Status: [facility] - Out-pt  History of Present Illness: Martin [name] is a 29 y.o. 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 Previous port that was placed at outside facility became infected and had to be removed earlier this year, well healed now. He is referred for port placement PMHx, meds, labs, imaging, allergies reviewed. Has continued to hold his Eliquis as directed. Feels well, no recent fevers, chills, illness. Has been NPO today as directed.  Past Medical History:  Diagnosis Date  . Anxiety   . HIV (human immunodeficiency virus infection) ([place])   . Sickle cell crisis ([place])   . 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[place])   . Vitamin D deficiency 10/2018    No past surgical history on file.  Allergies: Tape and Ketorolac  Medications: Prior to Admission medications   Medication Sig Start Date End Date Taking? Authorizing Provider  abacavir-dolutegravir-lamiVUDine (TRIUMEQ) 600-50-300 MG tablet Take 1 tablet by mouth at bedtime.     [provider]  ALPRAZolam Duanne Moron) 1 MG tablet Take 1 tablet by mouth 3 times daily as needed for anxiety or sleep. 08/23/19 09/22/19  Azzie Glatter, [name]  apixaban (ELIQUIS) 5 MG TABS tablet Take 1 tablet (5 mg total) by mouth 2 (two) times daily. 08/21/19   [name], [name]  cetirizine (ZYRTEC) 10 MG tablet Take 1 tablet (10 mg total) by mouth daily. 08/04/19   Azzie Glatter, [name]  cholecalciferol (VITAMIN D3) 25 MCG (1000 UNIT) tablet Take 1,000 Units by mouth at bedtime.    [provider]  diphenhydrAMINE (BENADRYL) 25 MG tablet Take 25 mg by mouth every 6 (six) hours as needed for allergies.    [provider]  hydroxyurea (HYDREA) 500 MG capsule Take 4 capsules (2,000 mg  total) by mouth at bedtime. 05/26/19   Azzie Glatter, [name]  Multiple Vitamin (MULTIVITAMIN WITH MINERALS) TABS tablet Take 1 tablet by mouth daily with breakfast.     [provider]  naloxone (NARCAN) 0.4 MG/ML injection As needed. Patient not taking: Reported on 08/15/2019 11/28/18   Azzie Glatter, [name]  oxyCODONE-acetaminophen (PERCOCET) 10-325 MG tablet Take 1 tablet by mouth every 4 (four) hours as needed for up to 15 days for pain. 08/23/19 09/07/19  Azzie Glatter, [name]  Vitamin D, Ergocalciferol, (DRISDOL) 1.25 MG (50000 UT) CAPS capsule Take 1 capsule (50,000 Units total) by mouth every 7 (seven) days. 11/01/18   Azzie Glatter, [name]     Family History  Problem Relation Age of Onset  . Sickle cell trait Mother   . Sickle cell trait Father   . Birth defects Maternal Grandmother   . Birth defects Paternal Grandmother     Social History   Socioeconomic History  . Marital status: Single    Spouse name: Not on file  . Number of children: Not on file  . Years of education: Not on file  . Highest education level: Not on file  Occupational History  . Not on file  Tobacco Use  . Smoking status: Never Smoker  . Smokeless tobacco: Never Used  Substance and Sexual Activity  . Alcohol use: No  . Drug use: No  . Sexual activity: Yes    Birth control/protection: Condom  Other Topics Concern  . Not  on file  Social History Narrative  . Not on file   Social Determinants of Health   Financial Resource Strain:   . Difficulty of Paying Living Expenses:   Food Insecurity:   . Worried About Programme researcher, broadcasting/film/video in the Last Year:   . Barista in the Last Year:   Transportation Needs:   . Freight forwarder (Medical):   Marland Kitchen Lack of Transportation (Non-Medical):   Physical Activity:   . Days of Exercise per Week:   . Minutes of Exercise per Session:   Stress:   . Feeling of Stress :   Social Connections:   . Frequency of Communication with Friends and Family:    . Frequency of Social Gatherings with Friends and Family:   . Attends Religious Services:   . Active Member of Clubs or Organizations:   . Attends Banker Meetings:   Marland Kitchen Marital Status:      Review of Systems: A 12 point ROS discussed and pertinent positives are indicated in the HPI above.  All other systems are negative.  Review of Systems  Vital Signs: BP 101/72   Pulse (!) 101   Temp 98.1 F (36.7 C) (Oral)   Resp 18   SpO2 92%   Physical Exam Constitutional:      General: He is not in acute distress.    Appearance: Normal appearance. He is not ill-appearing.  HENT:     Mouth/Throat:     Mouth: Mucous membranes are moist.     Pharynx: Oropharynx is clear.  Cardiovascular:     Rate and Rhythm: Normal rate and regular rhythm.     Heart sounds: Normal heart sounds.  Pulmonary:     Effort: Pulmonary effort is normal. No respiratory distress.     Breath sounds: Normal breath sounds.  Abdominal:     General: Abdomen is flat.     Palpations: Abdomen is soft.     Tenderness: There is no abdominal tenderness.  Skin:    General: Skin is warm and dry.  Neurological:     General: No focal deficit present.     Mental Status: He is alert and oriented to person, place, and time.  Psychiatric:        Mood and Affect: Mood normal.        Thought Content: Thought content normal.        Judgment: Judgment normal.     Imaging: DG Chest 2 View  Result Date: 08/08/2019 CLINICAL DATA:  Chest pain associated with a sickle cell crisis. EXAM: CHEST - 2 VIEW COMPARISON:  08/06/2019.  Chest CTA dated 02/10/2019. FINDINGS: The heart remains normal in size. The lungs remain hyperexpanded with stable diffuse peribronchial thickening and accentuation of the interstitial markings. Two small areas of a increased density in the apical region of the left lung with adjacent pleural thickening are stable. These correspond to areas of nodularity seen on a CTA dated 02/10/2019. Stable  linear scarring anteriorly in the lower lung zones on the lateral view. Unremarkable bones. IMPRESSION: 1. No acute abnormality. 2. Stable left upper lobe nodules and pleural thickening. 3. Stable changes of COPD and chronic bronchitis with interstitial fibrosis. Electronically Signed   By: Beckie Salts M.D.   On: 08/08/2019 16:49   DG Chest 2 View  Result Date: 08/06/2019 CLINICAL DATA:  Chest congestion. History of sickle cell, HIV. EXAM: CHEST - 2 VIEW COMPARISON:  Chest x-ray dated 04/23/2019. FINDINGS: Heart size and mediastinal  contours are stable. Coarse lung markings are again seen bilaterally, not significantly changed. Questionable new small opacity within the LEFT upper lung. No pleural effusion or pneumothorax is seen. IMPRESSION: 1. Questionable new small opacity within the LEFT upper lung, which could represent early pneumonia or asymmetric edema. 2. Chronic interstitial lung disease/fibrosis. Electronically Signed   By: Bary Richard M.D.   On: 08/06/2019 14:26    Labs:  CBC: Recent Labs    08/06/19 0522 08/07/19 0503 08/08/19 0740 08/09/19 0759  WBC 12.9* 14.0* 14.2* 12.0*  HGB 7.4* 7.0* 7.1* 7.0*  HCT 20.9* 17.6* 19.0* 18.0*  PLT 356 325 289 294    COAGS: Recent Labs    02/10/19 0340  INR 1.2  APTT 31    BMP: Recent Labs    05/26/19 0221 08/04/19 1023 08/07/19 0503 08/08/19 0740  NA 137 136 133* 136  K 4.0 4.6 4.3 4.3  CL 107 105 100 103  CO2 22 23 26 25   GLUCOSE 95 85 95 116*  BUN 9 9 12 13   CALCIUM 9.2 8.7* 8.5* 8.8*  CREATININE 0.82 0.60* 0.74 0.84  GFRNONAA >60 >60 >60 >60  GFRAA >60 >60 >60 >60    LIVER FUNCTION TESTS: Recent Labs    04/23/19 0609 05/26/19 0221 08/04/19 1023 08/07/19 0503  BILITOT 8.0* 6.6* 10.1* 7.0*  AST 32 39 53* 51*  ALT 22 26 25 20   ALKPHOS 65 61 60 52  PROT 8.1 8.4* 9.4* 8.0  ALBUMIN 4.0 3.8 3.7 3.2*    TUMOR MARKERS: No results for input(s): AFPTM, CEA, CA199, CHROMGRNA in the last 8760 hours.  Assessment  and Plan: Sickle Cell HIV Poor venous access. Previous port infected had to be removed earlier this year, well healed now. Plan for new port placement for durable IV access needs. Labs pending Risks and benefits of image guided port-a-catheter placement was discussed with the patient including, but not limited to bleeding, infection, pneumothorax, or fibrin sheath development and need for additional procedures.  All of the patient's questions were answered, patient is agreeable to proceed. Consent signed and in chart.    Thank you for this interesting consult.  I greatly enjoyed meeting Shann X Lopes and look forward to participating in their care.  A copy of this report was sent to the requesting provider on this date.  Electronically Signed: 08/09/19, PA-C 08/29/2019, 2:02 PM   I spent a total of 20 minutes in face to face in clinical consultation, greater than 50% of which was counseling/coordinating care for port

## 2019-08-29 NOTE — Procedures (Signed)
Pre Procedure Dx: Poor venous access Post Procedural Dx: Same  Successful placement of right IJ approach port-a-cath with tip at the superior caval atrial junction. The catheter is ready for immediate use.  Estimated Blood Loss: Minimal  Complications: None immediate.  Jay Charisma Charlot, MD Pager #: [phone]   

## 2019-09-08 ENCOUNTER — Telehealth: Payer: Self-pay | Admitting: Family Medicine

## 2019-09-09 ENCOUNTER — Other Ambulatory Visit: Payer: Self-pay | Admitting: Nurse Practitioner

## 2019-09-09 DIAGNOSIS — G8929 Other chronic pain: Secondary | ICD-10-CM

## 2019-09-09 DIAGNOSIS — D571 Sickle-cell disease without crisis: Secondary | ICD-10-CM

## 2019-09-09 MED ORDER — GENERIC EXTERNAL MEDICATION
Status: DC
Start: ? — End: 2019-09-09

## 2019-09-09 MED ORDER — POLYETHYLENE GLYCOL 3350 17 GM/SCOOP PO POWD
17.00 | ORAL | Status: DC
Start: 2019-09-10 — End: 2019-09-09

## 2019-09-09 MED ORDER — ABACAVIR-DOLUTEGRAVIR-LAMIVUD 600-50-300 MG PO TABS
1.00 | ORAL_TABLET | ORAL | Status: DC
Start: 2019-09-09 — End: 2019-09-09

## 2019-09-09 MED ORDER — HYDROXYUREA 500 MG PO CAPS
2000.00 | ORAL_CAPSULE | ORAL | Status: DC
Start: 2019-09-10 — End: 2019-09-09

## 2019-09-09 MED ORDER — APIXABAN 5 MG PO TABS
5.00 | ORAL_TABLET | ORAL | Status: DC
Start: 2019-09-09 — End: 2019-09-09

## 2019-09-09 MED ORDER — ALPRAZOLAM 0.25 MG PO TABS
0.25 | ORAL_TABLET | ORAL | Status: DC
Start: ? — End: 2019-09-09

## 2019-09-09 MED ORDER — QUINTABS PO TABS
1.00 | ORAL_TABLET | ORAL | Status: DC
Start: 2019-09-10 — End: 2019-09-09

## 2019-09-09 MED ORDER — ONDANSETRON HCL 4 MG/2ML IJ SOLN
4.00 | INTRAMUSCULAR | Status: DC
Start: ? — End: 2019-09-09

## 2019-09-09 MED ORDER — OXYCODONE-ACETAMINOPHEN 10-325 MG PO TABS: 1.0000 | ORAL_TABLET | ORAL | 0 refills | Status: DC | PRN

## 2019-09-09 MED ORDER — ALUM & MAG HYDROXIDE-SIMETH 200-200-20 MG/5ML PO SUSP
30.00 | ORAL | Status: DC
Start: ? — End: 2019-09-09

## 2019-09-09 MED ORDER — GENERIC EXTERNAL MEDICATION
1.00 | Status: DC
Start: 2019-09-09 — End: 2019-09-09

## 2019-09-09 MED ORDER — MAGNESIUM HYDROXIDE 400 MG/5ML PO SUSP
30.00 | ORAL | Status: DC
Start: ? — End: 2019-09-09

## 2019-09-09 MED ORDER — FOLIC ACID 1 MG PO TABS
2.00 | ORAL_TABLET | ORAL | Status: DC
Start: 2019-09-10 — End: 2019-09-09

## 2019-09-09 MED ORDER — ERGOCALCIFEROL 1.25 MG (50000 UT) PO CAPS
50000.00 | ORAL_CAPSULE | ORAL | Status: DC
Start: 2019-09-12 — End: 2019-09-09

## 2019-09-09 MED ORDER — HEPARIN SODIUM LOCK FLUSH 100 UNIT/ML IV SOLN
500.00 | INTRAVENOUS | Status: DC
Start: ? — End: 2019-09-09

## 2019-09-09 MED ORDER — ALBUTEROL SULFATE HFA 108 (90 BASE) MCG/ACT IN AERS
1.00 | INHALATION_SPRAY | RESPIRATORY_TRACT | Status: DC
Start: 2019-09-09 — End: 2019-09-09

## 2019-09-09 MED ORDER — DIPHENHYDRAMINE HCL 50 MG/ML IJ SOLN
25.00 | INTRAMUSCULAR | Status: DC
Start: ? — End: 2019-09-09

## 2019-09-09 NOTE — Progress Notes (Signed)
Refill sent.

## 2019-09-11 NOTE — Telephone Encounter (Signed)
Done

## 2019-09-15 ENCOUNTER — Inpatient Hospital Stay: Payer: Medicaid Other | Admitting: Family Medicine

## 2019-09-21 ENCOUNTER — Telehealth (HOSPITAL_COMMUNITY): Payer: Self-pay | Admitting: *Deleted

## 2019-09-21 ENCOUNTER — Encounter (HOSPITAL_COMMUNITY): Payer: Self-pay | Admitting: Internal Medicine

## 2019-09-21 ENCOUNTER — Non-Acute Institutional Stay (HOSPITAL_BASED_OUTPATIENT_CLINIC_OR_DEPARTMENT_OTHER)
Admission: AD | Admit: 2019-09-21 | Discharge: 2019-09-21 | Disposition: A | Discharge status: Home / Self Care | Payer: Medicaid Other | Source: Ambulatory Visit | Attending: Internal Medicine | Admitting: Internal Medicine

## 2019-09-21 ENCOUNTER — Other Ambulatory Visit: Payer: Self-pay

## 2019-09-21 ENCOUNTER — Inpatient Hospital Stay (HOSPITAL_COMMUNITY)
Admission: EM | Admit: 2019-09-21 | Discharge: 2019-09-25 | DRG: 812 | Disposition: A | Discharge status: Home / Self Care | Payer: Medicaid Other | Attending: Internal Medicine | Admitting: Internal Medicine

## 2019-09-21 DIAGNOSIS — B20 Human immunodeficiency virus [HIV] disease: Secondary | ICD-10-CM | POA: Diagnosis present

## 2019-09-21 DIAGNOSIS — F1120 Opioid dependence, uncomplicated: Secondary | ICD-10-CM | POA: Diagnosis present

## 2019-09-21 DIAGNOSIS — D5700 Hb-SS disease with crisis, unspecified: Secondary | ICD-10-CM | POA: Diagnosis not present

## 2019-09-21 DIAGNOSIS — Z21 Asymptomatic human immunodeficiency virus [HIV] infection status: Secondary | ICD-10-CM | POA: Insufficient documentation

## 2019-09-21 DIAGNOSIS — Z86711 Personal history of pulmonary embolism: Secondary | ICD-10-CM | POA: Insufficient documentation

## 2019-09-21 DIAGNOSIS — F411 Generalized anxiety disorder: Secondary | ICD-10-CM | POA: Diagnosis present

## 2019-09-21 DIAGNOSIS — Z79899 Other long term (current) drug therapy: Secondary | ICD-10-CM | POA: Insufficient documentation

## 2019-09-21 DIAGNOSIS — Z832 Family history of diseases of the blood and blood-forming organs and certain disorders involving the immune mechanism: Secondary | ICD-10-CM | POA: Insufficient documentation

## 2019-09-21 DIAGNOSIS — G894 Chronic pain syndrome: Secondary | ICD-10-CM | POA: Insufficient documentation

## 2019-09-21 DIAGNOSIS — Z888 Allergy status to other drugs, medicaments and biological substances status: Secondary | ICD-10-CM | POA: Insufficient documentation

## 2019-09-21 DIAGNOSIS — D72829 Elevated white blood cell count, unspecified: Secondary | ICD-10-CM | POA: Diagnosis present

## 2019-09-21 DIAGNOSIS — E559 Vitamin D deficiency, unspecified: Secondary | ICD-10-CM | POA: Diagnosis present

## 2019-09-21 DIAGNOSIS — F419 Anxiety disorder, unspecified: Secondary | ICD-10-CM | POA: Insufficient documentation

## 2019-09-21 DIAGNOSIS — D571 Sickle-cell disease without crisis: Secondary | ICD-10-CM

## 2019-09-21 DIAGNOSIS — K219 Gastro-esophageal reflux disease without esophagitis: Secondary | ICD-10-CM | POA: Diagnosis present

## 2019-09-21 DIAGNOSIS — D638 Anemia in other chronic diseases classified elsewhere: Secondary | ICD-10-CM | POA: Insufficient documentation

## 2019-09-21 DIAGNOSIS — Z20822 Contact with and (suspected) exposure to covid-19: Secondary | ICD-10-CM | POA: Diagnosis present

## 2019-09-21 DIAGNOSIS — Z7901 Long term (current) use of anticoagulants: Secondary | ICD-10-CM | POA: Insufficient documentation

## 2019-09-21 DIAGNOSIS — R17 Unspecified jaundice: Secondary | ICD-10-CM

## 2019-09-21 DIAGNOSIS — G8929 Other chronic pain: Secondary | ICD-10-CM

## 2019-09-21 DIAGNOSIS — Z8489 Family history of other specified conditions: Secondary | ICD-10-CM | POA: Insufficient documentation

## 2019-09-21 LAB — CBC WITH DIFFERENTIAL/PLATELET
Abs Immature Granulocytes: 0.04 10*3/uL (ref 0.00–0.07)
Basophils Absolute: 0.1 10*3/uL (ref 0.0–0.1)
Basophils Relative: 0 %
Eosinophils Absolute: 0 10*3/uL (ref 0.0–0.5)
Eosinophils Relative: 0 %
HCT: 26.3 % — ABNORMAL LOW (ref 39.0–52.0)
Hemoglobin: 9.3 g/dL — ABNORMAL LOW (ref 13.0–17.0)
Immature Granulocytes: 0 %
Lymphocytes Relative: 48 %
Lymphs Abs: 6.3 10*3/uL — ABNORMAL HIGH (ref 0.7–4.0)
MCH: 40.6 pg — ABNORMAL HIGH (ref 26.0–34.0)
MCHC: 35.4 g/dL (ref 30.0–36.0)
MCV: 114.8 fL — ABNORMAL HIGH (ref 80.0–100.0)
Monocytes Absolute: 2 10*3/uL — ABNORMAL HIGH (ref 0.1–1.0)
Monocytes Relative: 15 %
Neutro Abs: 4.9 10*3/uL (ref 1.7–7.7)
Neutrophils Relative %: 37 %
Platelets: 379 10*3/uL (ref 150–400)
RBC: 2.29 MIL/uL — ABNORMAL LOW (ref 4.22–5.81)
RDW: 24.4 % — ABNORMAL HIGH (ref 11.5–15.5)
WBC: 11.4 10*3/uL — ABNORMAL HIGH (ref 4.0–10.5)
nRBC: 5.6 % — ABNORMAL HIGH (ref 0.0–0.2)

## 2019-09-21 LAB — COMPREHENSIVE METABOLIC PANEL
ALT: 22 U/L (ref 0–44)
ALT: 23 U/L (ref 0–44)
AST: 30 U/L (ref 15–41)
AST: 29 U/L (ref 15–41)
Albumin: 3.6 g/dL (ref 3.5–5.0)
Albumin: 3.7 g/dL (ref 3.5–5.0)
Alkaline Phosphatase: 56 U/L (ref 38–126)
Alkaline Phosphatase: 52 U/L (ref 38–126)
Anion gap: 9 (ref 5–15)
Anion gap: 6 (ref 5–15)
BUN: 14 mg/dL (ref 6–20)
BUN: 14 mg/dL (ref 6–20)
CO2: 23 mmol/L (ref 22–32)
CO2: 25 mmol/L (ref 22–32)
Calcium: 8.6 mg/dL — ABNORMAL LOW (ref 8.9–10.3)
Calcium: 8.7 mg/dL — ABNORMAL LOW (ref 8.9–10.3)
Chloride: 110 mmol/L (ref 98–111)
Chloride: 110 mmol/L (ref 98–111)
Creatinine, Ser: 0.6 mg/dL — ABNORMAL LOW (ref 0.61–1.24)
Creatinine, Ser: 0.55 mg/dL — ABNORMAL LOW (ref 0.61–1.24)
GFR calc Af Amer: >60 mL/min (ref >60)
GFR calc Af Amer: >60 mL/min (ref >60)
GFR calc non Af Amer: >60 mL/min (ref >60)
GFR calc non Af Amer: >60 mL/min (ref >60)
Glucose, Bld: 109 mg/dL — ABNORMAL HIGH (ref 70–99)
Glucose, Bld: 86 mg/dL (ref 70–99)
Potassium: 4 mmol/L (ref 3.5–5.1)
Potassium: 3.6 mmol/L (ref 3.5–5.1)
Sodium: 142 mmol/L (ref 135–145)
Sodium: 141 mmol/L (ref 135–145)
Total Bilirubin: 6.6 mg/dL — ABNORMAL HIGH (ref 0.3–1.2)
Total Bilirubin: 6.9 mg/dL — ABNORMAL HIGH (ref 0.3–1.2)
Total Protein: 8.2 g/dL — ABNORMAL HIGH (ref 6.5–8.1)
Total Protein: 8.3 g/dL — ABNORMAL HIGH (ref 6.5–8.1)

## 2019-09-21 LAB — RETICULOCYTES
Immature Retic Fract: 23.9 % — ABNORMAL HIGH (ref 2.3–15.9)
Immature Retic Fract: 24 % — ABNORMAL HIGH (ref 2.3–15.9)
RBC.: 2.35 MIL/uL — ABNORMAL LOW (ref 4.22–5.81)
RBC.: 1.85 MIL/uL — ABNORMAL LOW (ref 4.22–5.81)
Retic Count, Absolute: 447 10*3/uL — ABNORMAL HIGH (ref 19.0–186.0)
Retic Count, Absolute: 368.5 10*3/uL — ABNORMAL HIGH (ref 19.0–186.0)
Retic Ct Pct: 19 % — ABNORMAL HIGH (ref 0.4–3.1)
Retic Ct Pct: 19.9 % — ABNORMAL HIGH (ref 0.4–3.1)

## 2019-09-21 LAB — LACTATE DEHYDROGENASE
LDH: 330 U/L — ABNORMAL HIGH (ref 98–192)
LDH: 306 U/L — ABNORMAL HIGH (ref 98–192)

## 2019-09-21 MED ORDER — SODIUM CHLORIDE 0.9% FLUSH
10.0000 mL | INTRAVENOUS | Status: AC | PRN
  Administered 2019-09-21: 10 mL

## 2019-09-21 MED ORDER — ONDANSETRON HCL 4 MG/2ML IJ SOLN: 4.0000 mg | Freq: Four times a day (QID) | INTRAMUSCULAR | Status: DC | PRN

## 2019-09-21 MED ORDER — ACETAMINOPHEN 500 MG PO TABS
1000.0000 mg | ORAL_TABLET | Freq: Once | ORAL | Status: AC
  Administered 2019-09-21: 1000 mg via ORAL
  Filled 2019-09-21: qty 2

## 2019-09-21 MED ORDER — ONDANSETRON HCL 4 MG/2ML IJ SOLN
4.0000 mg | INTRAMUSCULAR | Status: DC | PRN
  Administered 2019-09-21 – 2019-09-22 (×2): 4 mg via INTRAVENOUS
  Filled 2019-09-21: qty 2

## 2019-09-21 MED ORDER — NALOXONE HCL 0.4 MG/ML IJ SOLN: 0.4000 mg | INTRAMUSCULAR | Status: DC | PRN

## 2019-09-21 MED ORDER — SODIUM CHLORIDE 0.45 % IV SOLN: INTRAVENOUS | Status: DC

## 2019-09-21 MED ORDER — HEPARIN SOD (PORK) LOCK FLUSH 100 UNIT/ML IV SOLN
500.0000 [IU] | INTRAVENOUS | Status: AC | PRN
  Administered 2019-09-21: 500 [IU]
  Filled 2019-09-21: qty 5

## 2019-09-21 MED ORDER — HYDROMORPHONE 1 MG/ML IV SOLN
INTRAVENOUS | Status: DC
  Administered 2019-09-21: 30 mg via INTRAVENOUS
  Administered 2019-09-21: 12.5 mg via INTRAVENOUS
  Filled 2019-09-21: qty 30

## 2019-09-21 MED ORDER — DIPHENHYDRAMINE HCL 25 MG PO CAPS: 25.0000 mg | ORAL_CAPSULE | ORAL | Status: DC | PRN

## 2019-09-21 MED ORDER — HYDROMORPHONE HCL 2 MG/ML IJ SOLN
2.0000 mg | INTRAMUSCULAR | Status: AC
  Administered 2019-09-22: 2 mg via INTRAVENOUS
  Filled 2019-09-21: qty 1

## 2019-09-21 MED ORDER — HYDROMORPHONE HCL 2 MG/ML IJ SOLN
2.0000 mg | INTRAMUSCULAR | Status: AC
  Administered 2019-09-21: 2 mg via INTRAVENOUS
  Filled 2019-09-21: qty 1

## 2019-09-21 MED ORDER — SODIUM CHLORIDE 0.9% FLUSH: 9.0000 mL | INTRAVENOUS | Status: DC | PRN

## 2019-09-21 NOTE — Discharge Instructions (Signed)
Sickle Cell Anemia, Adult  Sickle cell anemia is a condition where your red blood cells are shaped like sickles. Red blood cells carry oxygen through the body. Sickle-shaped cells do not live as long as normal red blood cells. They also clump together and block blood from flowing through the blood vessels. This prevents the body from getting enough oxygen. Sickle cell anemia causes organ damage and pain. It also increases the risk of infection. Follow these instructions at home: Medicines  Take over-the-counter and prescription medicines only as told by your doctor.  If you were prescribed an antibiotic medicine, take it as told by your doctor. Do not stop taking the antibiotic even if you start to feel better.  If you develop a fever, do not take medicines to lower the fever right away. Tell your doctor about the fever. Managing pain, stiffness, and swelling  Try these methods to help with pain: ? Use a heating pad. ? Take a warm bath. ? Distract yourself, such as by watching TV. Eating and drinking  Drink enough fluid to keep your pee (urine) clear or pale yellow. Drink more in hot weather and during exercise.  Limit or avoid alcohol.  Eat a healthy diet. Eat plenty of fruits, vegetables, whole grains, and lean protein.  Take vitamins and supplements as told by your doctor. Traveling  When traveling, keep these with you: ? Your medical information. ? The names of your doctors. ? Your medicines.  If you need to take an airplane, talk to your doctor first. Activity  Rest often.  Avoid exercises that make your heart beat much faster, such as jogging. General instructions  Do not use products that have nicotine or tobacco, such as cigarettes and e-cigarettes. If you need help quitting, ask your doctor.  Consider wearing a medical alert bracelet.  Avoid being in high places (high altitudes), such as mountains.  Avoid very hot or cold temperatures.  Avoid places where the  temperature changes a lot.  Keep all follow-up visits as told by your doctor. This is important. Contact a doctor if:  A joint hurts.  Your feet or hands hurt or swell.  You feel tired (fatigued). Get help right away if:  You have symptoms of infection. These include: ? Fever. ? Chills. ? Being very tired. ? Irritability. ? Poor eating. ? Throwing up (vomiting).  You feel dizzy or faint.  You have new stomach pain, especially on the left side.  You have a an erection (priapism) that lasts more than 4 hours.  You have numbness in your arms or legs.  You have a hard time moving your arms or legs.  You have trouble talking.  You have pain that does not go away when you take medicine.  You are short of breath.  You are breathing fast.  You have a long-term cough.  You have pain in your chest.  You have a bad headache.  You have a stiff neck.  Your stomach looks bloated even though you did not eat much.  Your skin is pale.  You suddenly cannot see well. Summary  Sickle cell anemia is a condition where your red blood cells are shaped like sickles.  Follow your doctor's advice on ways to manage pain, food to eat, activities to do, and steps to take for safe travel.  Get medical help right away if you have any signs of infection, such as a fever. This information is not intended to replace advice given to you by   your health care provider. Make sure you discuss any questions you have with your health care provider. Document Revised: 07/29/2018 Document Reviewed: 05/12/2016 Elsevier Patient Education  2020 Elsevier Inc.  

## 2019-09-21 NOTE — Telephone Encounter (Signed)
Patient called requesting to come to the day hospital for sickle cell pain crisis. Patient reports generalized pain rated 10/10. Reports taking Percocet at 7:30 am with little relief. Patient lives in [place] and was treated in an [place] ER last night for pain crisis. COVID-19 screening done and patient denies all symptoms and exposures. Denies fever, chest pain, nausea, vomiting, diarrhea, abdominal pain and priapism. Patient admits to having transportation at discharge without driving self. [place], [name] notified. Patient can come to the day hospital for pain management. Patient advised and expresses an understanding.

## 2019-09-21 NOTE — ED Provider Notes (Signed)
[facility] DEPT Provider Note   CSN: [id number] Arrival date & time: 09/21/19  2122     History Chief Complaint  Patient presents with  . Sickle Cell Pain Crisis    Martin [name] is a 29 y.o. male with a hx of anxiety, HIV, sickle cell disease presents to the Emergency Department complaining of gradual, persistent, progressively worsening low back and bilateral hip pain onset this several days ago. Pt reports he was treated in [place] last night for sickle cell pain and pain increased this morning around 7:30am.  Pt then presented to the day hospital and was placed on a dilaudid PCA.  Pt reports pain was initially better and he was discharged at 4:30pm.  Pt reports after returning home he took his home percocet without relief.  He returns to the ER tonight with persistent pain in his low back and hips.  Pt reports this is normal for his pain crisis.  Pt denies fever, chills, headache, neck pain, neck stiffness, chest pain, SOB, abd pain, N/V/D, weakness, dizziness, syncope.  Pt reports wearing O2 at night due to "low sats."  He has not had to increase above his standard 2lpm.  No specific aggravating or alleviating factors. Pt rates his pain at a 10/10 at this time.  The history is provided by the patient and medical records. No language interpreter was used.       Past Medical History:  Diagnosis Date  . Anxiety   . HIV (human immunodeficiency virus infection) ([place])   . Sickle cell crisis ([place])   . 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[place])   . Vitamin D deficiency 10/2018    Patient Active Problem List   Diagnosis Date Noted  . Chronic, continuous use of opioids 08/15/2019  . Seasonal allergies 08/15/2019  . Hypoxia   . Chest congestion   . Sickle cell anemia with crisis ([place]) 08/04/2019  . Chronic pain syndrome 08/04/2019  . History of pulmonary embolus (PE) 08/04/2019  . Acute pulmonary embolism ([place]) 02/10/2019  . Acute chest syndrome due to sickle cell  crisis ([place]) 02/10/2019  . Sickle cell crisis ([place]) 01/17/2019  . Heart murmur 02/03/2017  . Sickle cell disease with crisis ([place]) 02/02/2017  . 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 Sickle cell pain crisis ([place]) 02/02/2017  . 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[place]) 12/20/2016  . Vitamin D deficiency 08/14/2016  . High risk medication use 09/27/2014  . Generalized anxiety disorder 05/19/2014  . GERD (gastroesophageal reflux disease) 05/19/2014  . HIV (human immunodeficiency virus infection) ([place]) 05/23/2012    Past Surgical History:  Procedure Laterality Date  . IR IMAGING GUIDED PORT INSERTION  08/29/2019       Family History  Problem Relation Age of Onset  . Sickle cell trait Mother   . Sickle cell trait Father   . Birth defects Maternal Grandmother   . Birth defects Paternal Grandmother     Social History   Tobacco Use  . Smoking status: Never Smoker  . Smokeless tobacco: Never Used  Substance Use Topics  . Alcohol use: No  . Drug use: No    Home Medications Prior to Admission medications   Medication Sig Start Date End Date Taking? Authorizing Provider  abacavir-dolutegravir-lamiVUDine (TRIUMEQ) 600-50-300 MG tablet Take 1 tablet by mouth at bedtime.     [provider]  ALPRAZolam Duanne Moron) 1 MG tablet Take 1 tablet by mouth 3 times daily as needed for anxiety or sleep. 08/23/19 09/22/19  Azzie Glatter, [name]  apixaban (ELIQUIS) 5 MG TABS tablet Take 1 tablet (5 mg total) by mouth 2 (two) times daily. 08/21/19   Massie Maroon, [name]  cetirizine (ZYRTEC) 10 MG tablet Take 1 tablet (10 mg total) by mouth daily. 08/04/19   [name], [name]  cholecalciferol (VITAMIN D3) 25 MCG (1000 UNIT) tablet Take 1,000 Units by mouth at bedtime.    [provider]  diphenhydrAMINE (BENADRYL) 25 MG tablet Take 25 mg by mouth every 6 (six) hours as needed for allergies.    [provider]  hydroxyurea (HYDREA) 500 MG capsule Take 4 capsules (2,000 mg total) by mouth  at bedtime. 05/26/19   [name], [name]  Multiple Vitamin (MULTIVITAMIN WITH MINERALS) TABS tablet Take 1 tablet by mouth daily with breakfast.     [provider]  naloxone (NARCAN) 0.4 MG/ML injection As needed. Patient not taking: Reported on 08/15/2019 11/28/18   [name], [name]  oxyCODONE-acetaminophen (PERCOCET) 10-325 MG tablet Take 1 tablet by mouth every 4 (four) hours as needed for up to 15 days for pain. 09/09/19 09/24/19  Barbette Merino, NP  Vitamin D, Ergocalciferol, (DRISDOL) 1.25 MG (50000 UT) CAPS capsule Take 1 capsule (50,000 Units total) by mouth every 7 (seven) days. 11/01/18   [name], [name]    Allergies    Tape and Ketorolac  Review of Systems   Review of Systems  Constitutional: Negative for appetite change, diaphoresis, fatigue, fever and unexpected weight change.  HENT: Negative for mouth sores.   Eyes: Negative for visual disturbance.  Respiratory: Negative for cough, chest tightness, shortness of breath and wheezing.   Cardiovascular: Negative for chest pain.  Gastrointestinal: Negative for abdominal pain, constipation, diarrhea, nausea and vomiting.  Endocrine: Negative for polydipsia, polyphagia and polyuria.  Genitourinary: Negative for dysuria, frequency, hematuria and urgency.  Musculoskeletal: Positive for arthralgias and back pain. Negative for gait problem and neck stiffness.  Skin: Negative for rash.  Allergic/Immunologic: Negative for immunocompromised state.  Neurological: Negative for syncope, light-headedness and headaches.  Hematological: Does not bruise/bleed easily.  Psychiatric/Behavioral: Negative for sleep disturbance. The patient is not nervous/anxious.     Physical Exam Updated Vital Signs BP 104/74 (BP Location: Left Arm)   Pulse 63   Temp 98.4 F (36.9 C) (Oral)   Resp 14   Ht 6\' 3"  (1.905 m)   Wt 68 kg   SpO2 98%   BMI 18.75 kg/m   Physical Exam Vitals and nursing note reviewed.  Constitutional:       General: He is not in acute distress.    Appearance: He is well-developed. He is not diaphoretic.     Comments: Awake, alert, nontoxic appearing, NAD  HENT:     Head: Normocephalic and atraumatic.     Mouth/Throat:     Mouth: Mucous membranes are moist.  Eyes:     General: Scleral icterus present.     Conjunctiva/sclera: Conjunctivae normal.  Cardiovascular:     Rate and Rhythm: Normal rate and regular rhythm.     Pulses:          Radial pulses are 2+ on the right side and 2+ on the left side.       Dorsalis pedis pulses are 2+ on the right side and 2+ on the left side.       Posterior tibial pulses are 2+ on the right side and 2+ on the left side.     Comments: Capillary refill < 3 sec Pulmonary:  Effort: Pulmonary effort is normal. No tachypnea, accessory muscle usage or respiratory distress.     Breath sounds: Normal breath sounds. No decreased breath sounds, wheezing, rhonchi or rales.  Abdominal:     General: There is no distension.     Palpations: Abdomen is soft.     Tenderness: There is no abdominal tenderness. There is no guarding.  Musculoskeletal:        General: No tenderness. Normal range of motion.     Cervical back: Normal range of motion and neck supple.     Comments: Full ROM of all major joints except hips - pt with hips in flexion and able to extend fully, with significant pain, slightly less than full ROM of the hips No erythema or swelling of any major joint; tenderness over the hips  Lymphadenopathy:     Cervical: No cervical adenopathy.  Skin:    General: Skin is warm and dry.     Findings: No erythema.  Neurological:     Mental Status: He is alert and oriented to person, place, and time.  Psychiatric:        Behavior: Behavior normal.     ED Results / Procedures / Treatments   Labs (all labs ordered are listed, but only abnormal results are displayed) Labs Reviewed  CBC WITH DIFFERENTIAL/PLATELET - Abnormal; Notable for the following  components:      Result Value   WBC 14.8 (*)    RBC 2.20 (*)    Hemoglobin 8.7 (*)    HCT 24.4 (*)    MCV 110.9 (*)    MCH 39.5 (*)    RDW 22.1 (*)    nRBC 3.2 (*)    Lymphs Abs 6.9 (*)    Monocytes Absolute 1.8 (*)    All other components within normal limits  RETICULOCYTES - Abnormal; Notable for the following components:   Retic Ct Pct 19.9 (*)    RBC. 1.85 (*)    Retic Count, Absolute 368.5 (*)    Immature Retic Fract 24.0 (*)    All other components within normal limits  COMPREHENSIVE METABOLIC PANEL - Abnormal; Notable for the following components:   Creatinine, Ser 0.55 (*)    Calcium 8.7 (*)    Total Protein 8.3 (*)    Total Bilirubin 6.9 (*)    All other components within normal limits  LACTATE DEHYDROGENASE - Abnormal; Notable for the following components:   LDH 306 (*)    All other components within normal limits  SARS CORONAVIRUS 2 BY RT PCR (HOSPITAL ORDER, PERFORMED IN [facility])  URINALYSIS, ROUTINE W REFLEX MICROSCOPIC    Procedures Procedures (including critical care time)  Medications Ordered in ED Medications  0.45 % sodium chloride infusion ( Intravenous New Bag/Given 09/21/19 2311)  HYDROmorphone (DILAUDID) injection 2 mg (has no administration in time range)  ondansetron (ZOFRAN) injection 4 mg (4 mg Intravenous Given 09/21/19 2305)  diphenhydrAMINE (BENADRYL) capsule 50 mg (has no administration in time range)  HYDROmorphone (DILAUDID) injection 2 mg (2 mg Intravenous Given 09/21/19 2306)    ED Course  I have reviewed the triage vital signs and the nursing notes.  Pertinent labs & imaging results that were available during my care of the patient were reviewed by me and considered in my medical decision making (see chart for details).  Clinical Course as of Sep 22 30  Fri Sep 22, 2019  0001 Increasing from 19.0 however appears to be baseline for patient.  Retic Ct  Pct(!): 19.9 [HM]  0002 Decreasing  LDH(!): 306 [HM]  0002 Up from  6.6 earlier today however appears to be around baseline for patient.  Total Bilirubin(!): 6.9 [HM]    Clinical Course User Index [HM] Ovide Dusek, Dahlia Client, PA-C   MDM Rules/Calculators/A&P                       Pt presents with sickle cell pain crisis.  Pt was seen in [place] ED last night and day hospital today.  Pt will need admission for persistent sickle cell pain.  Scleral icterus noted. Pt reports this is normal when he is having a sickle cell pain crisis.  He reports it appears to be baseline.   SPO2 90% on RA in triage. Pt on 2lpm via [place] with normal oxygen saturations. No SOB, tachycardia.  Less likely PE at this time.  12:31 AM Labs appear to be baseline. Pt continues to have pain. Will admit.  Discussed patient's case with hospitalist, Dr. Mikeal Hawthorne.  I have recommended admission and patient (and family if present) agree with this plan. Admitting physician will place admission orders.    Final Clinical Impression(s) / ED Diagnoses Final diagnoses:  Sickle cell pain crisis (HCC)  Scleral icterus    Rx / DC Orders ED Discharge Orders    None       Julis Haubner, Boyd Kerbs 09/22/19 0032    Lorre Nick, MD 09/25/19 [phone]

## 2019-09-21 NOTE — ED Notes (Signed)
Pt ambulated to and from restroom without assistance  

## 2019-09-21 NOTE — Progress Notes (Signed)
Patient admitted to the day infusion hospital for sickle cell pain crisis. Initially, patient reported generalized pain rated 10/10. For pain management, patient placed on Dilaudid PCA, given 1000 mg Tylenol and hydrated with IV fluids. At discharge, patient rated pain at 4/10. Vital signs stable. Discharge instructions given. Patient alert, oriented and ambulatory at discharge.   

## 2019-09-21 NOTE — ED Triage Notes (Signed)
Pt reports SCC pain in lower back that started x2 days ago. Pt denies N/V/D, SOB, or chest pain.

## 2019-09-22 ENCOUNTER — Encounter (HOSPITAL_COMMUNITY): Payer: Self-pay | Admitting: Internal Medicine

## 2019-09-22 ENCOUNTER — Telehealth: Payer: Self-pay | Admitting: Family Medicine

## 2019-09-22 DIAGNOSIS — R17 Unspecified jaundice: Secondary | ICD-10-CM | POA: Diagnosis not present

## 2019-09-22 DIAGNOSIS — D72829 Elevated white blood cell count, unspecified: Secondary | ICD-10-CM | POA: Diagnosis present

## 2019-09-22 DIAGNOSIS — K219 Gastro-esophageal reflux disease without esophagitis: Secondary | ICD-10-CM | POA: Diagnosis present

## 2019-09-22 DIAGNOSIS — D638 Anemia in other chronic diseases classified elsewhere: Secondary | ICD-10-CM | POA: Diagnosis present

## 2019-09-22 DIAGNOSIS — Z20822 Contact with and (suspected) exposure to covid-19: Secondary | ICD-10-CM | POA: Diagnosis present

## 2019-09-22 DIAGNOSIS — G894 Chronic pain syndrome: Secondary | ICD-10-CM | POA: Diagnosis present

## 2019-09-22 DIAGNOSIS — Z79899 Other long term (current) drug therapy: Secondary | ICD-10-CM | POA: Diagnosis not present

## 2019-09-22 DIAGNOSIS — Z86711 Personal history of pulmonary embolism: Secondary | ICD-10-CM | POA: Diagnosis not present

## 2019-09-22 DIAGNOSIS — F1120 Opioid dependence, uncomplicated: Secondary | ICD-10-CM | POA: Diagnosis present

## 2019-09-22 DIAGNOSIS — G8929 Other chronic pain: Secondary | ICD-10-CM | POA: Diagnosis not present

## 2019-09-22 DIAGNOSIS — Z7901 Long term (current) use of anticoagulants: Secondary | ICD-10-CM | POA: Diagnosis not present

## 2019-09-22 DIAGNOSIS — Z832 Family history of diseases of the blood and blood-forming organs and certain disorders involving the immune mechanism: Secondary | ICD-10-CM | POA: Diagnosis not present

## 2019-09-22 DIAGNOSIS — D5700 Hb-SS disease with crisis, unspecified: Secondary | ICD-10-CM | POA: Diagnosis present

## 2019-09-22 DIAGNOSIS — F411 Generalized anxiety disorder: Secondary | ICD-10-CM | POA: Diagnosis present

## 2019-09-22 DIAGNOSIS — D571 Sickle-cell disease without crisis: Secondary | ICD-10-CM | POA: Diagnosis not present

## 2019-09-22 DIAGNOSIS — E559 Vitamin D deficiency, unspecified: Secondary | ICD-10-CM | POA: Diagnosis present

## 2019-09-22 DIAGNOSIS — Z21 Asymptomatic human immunodeficiency virus [HIV] infection status: Secondary | ICD-10-CM | POA: Diagnosis present

## 2019-09-22 LAB — CBC WITH DIFFERENTIAL/PLATELET
Abs Immature Granulocytes: 0.03 10*3/uL (ref 0.00–0.07)
Abs Immature Granulocytes: 0.02 10*3/uL (ref 0.00–0.07)
Basophils Absolute: 0 10*3/uL (ref 0.0–0.1)
Basophils Absolute: 0.1 10*3/uL (ref 0.0–0.1)
Basophils Relative: 0 %
Basophils Relative: 1 %
Eosinophils Absolute: 0.1 10*3/uL (ref 0.0–0.5)
Eosinophils Absolute: 0.2 10*3/uL (ref 0.0–0.5)
Eosinophils Relative: 1 %
Eosinophils Relative: 2 %
HCT: 24.4 % — ABNORMAL LOW (ref 39.0–52.0)
HCT: 23.8 % — ABNORMAL LOW (ref 39.0–52.0)
Hemoglobin: 8.7 g/dL — ABNORMAL LOW (ref 13.0–17.0)
Hemoglobin: 8.4 g/dL — ABNORMAL LOW (ref 13.0–17.0)
Immature Granulocytes: 0 %
Immature Granulocytes: 0 %
Lymphocytes Relative: 47 %
Lymphocytes Relative: 59 %
Lymphs Abs: 6.9 10*3/uL — ABNORMAL HIGH (ref 0.7–4.0)
Lymphs Abs: 7.2 10*3/uL — ABNORMAL HIGH (ref 0.7–4.0)
MCH: 39.5 pg — ABNORMAL HIGH (ref 26.0–34.0)
MCH: 39.4 pg — ABNORMAL HIGH (ref 26.0–34.0)
MCHC: 35.7 g/dL (ref 30.0–36.0)
MCHC: 35.3 g/dL (ref 30.0–36.0)
MCV: 110.9 fL — ABNORMAL HIGH (ref 80.0–100.0)
MCV: 111.7 fL — ABNORMAL HIGH (ref 80.0–100.0)
Monocytes Absolute: 1.8 10*3/uL — ABNORMAL HIGH (ref 0.1–1.0)
Monocytes Absolute: 1.5 10*3/uL — ABNORMAL HIGH (ref 0.1–1.0)
Monocytes Relative: 12 %
Monocytes Relative: 12 %
Neutro Abs: 5.9 10*3/uL (ref 1.7–7.7)
Neutro Abs: 3.1 10*3/uL (ref 1.7–7.7)
Neutrophils Relative %: 40 %
Neutrophils Relative %: 26 %
Platelets: 341 10*3/uL (ref 150–400)
Platelets: 364 10*3/uL (ref 150–400)
RBC: 2.2 MIL/uL — ABNORMAL LOW (ref 4.22–5.81)
RBC: 2.13 MIL/uL — ABNORMAL LOW (ref 4.22–5.81)
RDW: 22.1 % — ABNORMAL HIGH (ref 11.5–15.5)
RDW: 22.5 % — ABNORMAL HIGH (ref 11.5–15.5)
WBC: 14.8 10*3/uL — ABNORMAL HIGH (ref 4.0–10.5)
WBC: 11.2 10*3/uL — ABNORMAL HIGH (ref 4.0–10.5)
nRBC: 3.2 % — ABNORMAL HIGH (ref 0.0–0.2)
nRBC: 3.7 % — ABNORMAL HIGH (ref 0.0–0.2)

## 2019-09-22 LAB — SARS CORONAVIRUS 2 BY RT PCR (HOSPITAL ORDER, PERFORMED IN ~~LOC~~ HOSPITAL LAB): SARS Coronavirus 2: NEGATIVE

## 2019-09-22 MED ORDER — SODIUM CHLORIDE 0.9 % IV SOLN
25.0000 mg | INTRAVENOUS | Status: DC | PRN
  Filled 2019-09-22: qty 0.5

## 2019-09-22 MED ORDER — ONDANSETRON HCL 4 MG/2ML IJ SOLN
4.0000 mg | Freq: Four times a day (QID) | INTRAMUSCULAR | Status: DC | PRN
  Filled 2019-09-22: qty 2

## 2019-09-22 MED ORDER — NALOXONE HCL 0.4 MG/ML IJ SOLN: 0.4000 mg | INTRAMUSCULAR | Status: DC | PRN

## 2019-09-22 MED ORDER — ABACAVIR-DOLUTEGRAVIR-LAMIVUD 600-50-300 MG PO TABS
1.0000 | ORAL_TABLET | Freq: Every day | ORAL | Status: DC
  Administered 2019-09-22 – 2019-09-24 (×3): 1 via ORAL
  Filled 2019-09-22 (×4): qty 1

## 2019-09-22 MED ORDER — HYDROMORPHONE 1 MG/ML IV SOLN
INTRAVENOUS | Status: DC
  Administered 2019-09-22: 30 mg via INTRAVENOUS
  Administered 2019-09-22: 9 mg via INTRAVENOUS
  Administered 2019-09-22: 7 mg via INTRAVENOUS
  Administered 2019-09-22: 30 mg via INTRAVENOUS
  Administered 2019-09-22 (×2): 8.5 mg via INTRAVENOUS
  Administered 2019-09-23: 4.5 mg via INTRAVENOUS
  Administered 2019-09-23: 2.5 mg via INTRAVENOUS
  Administered 2019-09-23: 10 mg via INTRAVENOUS
  Administered 2019-09-23: 8.5 mg via INTRAVENOUS
  Administered 2019-09-23: 30 mg via INTRAVENOUS
  Administered 2019-09-23: 2 mg via INTRAVENOUS
  Administered 2019-09-24: 5.5 mg via INTRAVENOUS
  Administered 2019-09-24 (×2): 7 mg via INTRAVENOUS
  Administered 2019-09-24: 7.5 mg via INTRAVENOUS
  Administered 2019-09-24: 30 mg via INTRAVENOUS
  Administered 2019-09-24: 6.5 mg via INTRAVENOUS
  Administered 2019-09-24: 1.5 mg via INTRAVENOUS
  Administered 2019-09-25: 8.5 mg via INTRAVENOUS
  Administered 2019-09-25: 6.5 mg via INTRAVENOUS
  Administered 2019-09-25: 30 mg via INTRAVENOUS
  Administered 2019-09-25: 3.5 mg via INTRAVENOUS
  Filled 2019-09-22 (×5): qty 30

## 2019-09-22 MED ORDER — CHLORHEXIDINE GLUCONATE CLOTH 2 % EX PADS
6.0000 | MEDICATED_PAD | Freq: Every day | CUTANEOUS | Status: DC
  Administered 2019-09-22 – 2019-09-25 (×3): 6 via TOPICAL

## 2019-09-22 MED ORDER — ENOXAPARIN SODIUM 40 MG/0.4ML ~~LOC~~ SOLN: 40.0000 mg | SUBCUTANEOUS | Status: DC

## 2019-09-22 MED ORDER — APIXABAN 5 MG PO TABS
5.0000 mg | ORAL_TABLET | Freq: Two times a day (BID) | ORAL | Status: DC
  Administered 2019-09-22 – 2019-09-25 (×7): 5 mg via ORAL
  Filled 2019-09-22 (×7): qty 1

## 2019-09-22 MED ORDER — POLYETHYLENE GLYCOL 3350 17 G PO PACK
17.0000 g | PACK | Freq: Every day | ORAL | Status: DC | PRN
  Administered 2019-09-22: 17 g via ORAL
  Filled 2019-09-22 (×2): qty 1

## 2019-09-22 MED ORDER — SENNOSIDES-DOCUSATE SODIUM 8.6-50 MG PO TABS
1.0000 | ORAL_TABLET | Freq: Two times a day (BID) | ORAL | Status: DC
  Administered 2019-09-22 – 2019-09-24 (×5): 1 via ORAL
  Filled 2019-09-22 (×7): qty 1

## 2019-09-22 MED ORDER — HYDROXYUREA 500 MG PO CAPS
2000.0000 mg | ORAL_CAPSULE | Freq: Every day | ORAL | Status: DC
  Administered 2019-09-22 – 2019-09-24 (×3): 2000 mg via ORAL
  Filled 2019-09-22 (×3): qty 4

## 2019-09-22 MED ORDER — DIPHENHYDRAMINE HCL 50 MG PO CAPS
50.0000 mg | ORAL_CAPSULE | Freq: Three times a day (TID) | ORAL | Status: DC | PRN
  Administered 2019-09-22 – 2019-09-25 (×4): 50 mg via ORAL
  Filled 2019-09-22 (×3): qty 1
  Filled 2019-09-22: qty 2
  Filled 2019-09-22: qty 1

## 2019-09-22 MED ORDER — DEXTROSE-NACL 5-0.45 % IV SOLN: INTRAVENOUS | Status: DC

## 2019-09-22 MED ORDER — SODIUM CHLORIDE 0.9% FLUSH: 9.0000 mL | INTRAVENOUS | Status: DC | PRN

## 2019-09-22 MED ORDER — DIPHENHYDRAMINE HCL 25 MG PO CAPS
25.0000 mg | ORAL_CAPSULE | ORAL | Status: DC | PRN
  Administered 2019-09-23: 25 mg via ORAL
  Filled 2019-09-22: qty 1

## 2019-09-22 MED ORDER — ALPRAZOLAM 1 MG PO TABS
1.0000 mg | ORAL_TABLET | Freq: Three times a day (TID) | ORAL | Status: DC | PRN
  Administered 2019-09-22 – 2019-09-24 (×3): 1 mg via ORAL
  Filled 2019-09-22 (×3): qty 2

## 2019-09-22 NOTE — H&P (Signed)
[facility] History and Physical   Date: 09/22/2019  Patient name: Martin [name] Medical record number: [id number] Date of birth: 1991/02/05 Age: 29 y.o. Gender: male PCP: [name], [name]  Attending physician: No att. providers found  Chief Complaint: Sickle cell pain  History of Present Illness: Martin [name] is a 29 year old male with a medical history significant for sickle cell disease, chronic pain syndrome, opiate dependence and tolerance, HIV disease, history of PE on Eliquis, and history of anemia of chronic disease presents complaining of low back and bilateral hip pain that is consistent with his typical pain crisis.  Patient states that he and with increased pain on yesterday morning that has been unrelieved by home medications.  He last had Percocet this a.m. without sustained relief.  He attributes pain crisis to changes in weather and increased stressors at home.  Pain intensity is 9/10 characterized as constant, throbbing, and occasionally sharp.  Patient denies headache, dizziness, fever, chills, persistent cough, chest pain, urinary symptoms, nausea, vomiting, or diarrhea.  He denies any sick contacts, recent travel, or exposure to COVID-19.   Meds: Medications Prior to Admission  Medication Sig Dispense Refill Last Dose  . abacavir-dolutegravir-lamiVUDine (TRIUMEQ) 600-50-300 MG tablet Take 1 tablet by mouth at bedtime.    09/20/2019 at Unknown time  . ALPRAZolam (XANAX) 1 MG tablet Take 1 tablet by mouth 3 times daily as needed for anxiety or sleep. 30 tablet 0 09/20/2019 at Unknown time  . apixaban (ELIQUIS) 5 MG TABS tablet Take 1 tablet (5 mg total) by mouth 2 (two) times daily. 60 tablet 5 09/20/2019 at Unknown time  . cetirizine (ZYRTEC) 10 MG tablet Take 1 tablet (10 mg total) by mouth daily. 30 tablet 11 09/20/2019 at Unknown time  . cholecalciferol (VITAMIN D3) 25 MCG (1000 UNIT) tablet Take 1,000 Units by mouth at bedtime.   09/20/2019 at Unknown  time  . diphenhydrAMINE (BENADRYL) 25 MG tablet Take 25 mg by mouth every 6 (six) hours as needed for allergies.   09/20/2019 at Unknown time  . hydroxyurea (HYDREA) 500 MG capsule Take 4 capsules (2,000 mg total) by mouth at bedtime. 120 capsule 3 09/20/2019 at Unknown time  . Multiple Vitamin (MULTIVITAMIN WITH MINERALS) TABS tablet Take 1 tablet by mouth daily with breakfast.    09/20/2019 at Unknown time  . naloxone (NARCAN) 0.4 MG/ML injection As needed. (Patient not taking: Reported on 08/15/2019) 1 mL 2   . oxyCODONE-acetaminophen (PERCOCET) 10-325 MG tablet Take 1 tablet by mouth every 4 (four) hours as needed for up to 15 days for pain. 90 tablet 0 09/21/2019 at Unknown time  . Vitamin D, Ergocalciferol, (DRISDOL) 1.25 MG (50000 UT) CAPS capsule Take 1 capsule (50,000 Units total) by mouth every 7 (seven) days. 5 capsule 3 Past Week at Unknown time    Allergies: Tape and Ketorolac Past Medical History:  Diagnosis Date  . Anxiety   . HIV (human immunodeficiency virus infection) (HCC)   . Sickle cell crisis (HCC)   . 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HCC)   . Vitamin D deficiency 10/2018   Past Surgical History:  Procedure Laterality Date  . IR IMAGING GUIDED PORT INSERTION  08/29/2019   Family History  Problem Relation Age of Onset  . Sickle cell trait Mother   . Sickle cell trait Father   . Birth defects Maternal Grandmother   . Birth defects Paternal Grandmother    Social History   Socioeconomic History  . Marital status: Single  Spouse name: Not on file  . Number of children: Not on file  . Years of education: Not on file  . Highest education level: Not on file  Occupational History  . Not on file  Tobacco Use  . Smoking status: Never Smoker  . Smokeless tobacco: Never Used  Substance and Sexual Activity  . Alcohol use: No  . Drug use: No  . Sexual activity: Yes    Birth control/protection: Condom  Other Topics Concern  . Not on file  Social History Narrative  . Not on file    Social Determinants of Health   Financial Resource Strain:   . Difficulty of Paying Living Expenses:   Food Insecurity:   . Worried About Programme researcher, broadcasting/film/video in the Last Year:   . Barista in the Last Year:   Transportation Needs:   . Freight forwarder (Medical):   Marland Kitchen Lack of Transportation (Non-Medical):   Physical Activity:   . Days of Exercise per Week:   . Minutes of Exercise per Session:   Stress:   . Feeling of Stress :   Social Connections:   . Frequency of Communication with Friends and Family:   . Frequency of Social Gatherings with Friends and Family:   . Attends Religious Services:   . Active Member of Clubs or Organizations:   . Attends Banker Meetings:   Marland Kitchen Marital Status:   Intimate Partner Violence:   . Fear of Current or Ex-Partner:   . Emotionally Abused:   Marland Kitchen Physically Abused:   . Sexually Abused:    Review of Systems  Constitutional: Negative.   HENT: Negative.   Eyes: Negative.   Respiratory: Negative.   Cardiovascular: Negative.   Gastrointestinal: Negative.   Genitourinary: Negative.   Musculoskeletal: Positive for back pain and joint pain (bilateral hip).  Skin: Negative.   Neurological: Negative.   Psychiatric/Behavioral: Negative.     Physical Exam: Blood pressure (!) 107/58, pulse 93, temperature 98.4 F (36.9 C), temperature source Temporal, resp. rate 13, SpO2 93 %.  Physical Exam Constitutional:      Appearance: Normal appearance.  Eyes:     Conjunctiva/sclera: Conjunctivae normal.     Pupils: Pupils are equal, round, and reactive to light.  Cardiovascular:     Rate and Rhythm: Normal rate and regular rhythm.     Pulses: Normal pulses.  Pulmonary:     Effort: Pulmonary effort is normal.  Abdominal:     General: Abdomen is flat. Bowel sounds are normal.  Musculoskeletal:        General: Normal range of motion.  Neurological:     General: No focal deficit present.     Mental Status: He is alert.  Mental status is at baseline.  Psychiatric:        Mood and Affect: Mood normal.        Behavior: Behavior normal.        Thought Content: Thought content normal.        Judgment: Judgment normal.    Lab results: Results for orders placed or performed during the hospital encounter of 09/21/19 (from the past 24 hour(s))  CBC with Differential/Platelet     Status: Abnormal   Collection Time: 09/21/19 11:53 AM  Result Value Ref Range   WBC 11.4 (H) 4.0 - 10.5 K/uL   RBC 2.29 (L) 4.22 - 5.81 MIL/uL   Hemoglobin 9.3 (L) 13.0 - 17.0 g/dL   HCT 76.7 (L) 34.1 - 93.7 %  MCV 114.8 (H) 80.0 - 100.0 fL   MCH 40.6 (H) 26.0 - 34.0 pg   MCHC 35.4 30.0 - 36.0 g/dL   RDW 24.4 (H) 11.5 - 15.5 %   Platelets 379 150 - 400 K/uL   nRBC 5.6 (H) 0.0 - 0.2 %   Neutrophils Relative % 37 %   Neutro Abs 4.9 1.7 - 7.7 K/uL   Lymphocytes Relative 48 %   Lymphs Abs 6.3 (H) 0.7 - 4.0 K/uL   Monocytes Relative 15 %   Monocytes Absolute 2.0 (H) 0.1 - 1.0 K/uL   Eosinophils Relative 0 %   Eosinophils Absolute 0.0 0.0 - 0.5 K/uL   Basophils Relative 0 %   Basophils Absolute 0.1 0.0 - 0.1 K/uL   RBC Morphology NRBC'S PRESENT    Immature Granulocytes 0 %   Abs Immature Granulocytes 0.04 0.00 - 0.07 K/uL   Agglutination PRESENT    Tammy Sours Bodies PRESENT    Polychromasia PRESENT    Sickle Cells MARKED    Target Cells PRESENT   Reticulocytes     Status: Abnormal   Collection Time: 09/21/19 11:53 AM  Result Value Ref Range   Retic Ct Pct 19.0 (H) 0.4 - 3.1 %   RBC. 2.35 (L) 4.22 - 5.81 MIL/uL   Retic Count, Absolute 447.0 (H) 19.0 - 186.0 K/uL   Immature Retic Fract 23.9 (H) 2.3 - 15.9 %  Comprehensive metabolic panel     Status: Abnormal   Collection Time: 09/21/19 11:53 AM  Result Value Ref Range   Sodium 142 135 - 145 mmol/L   Potassium 4.0 3.5 - 5.1 mmol/L   Chloride 110 98 - 111 mmol/L   CO2 23 22 - 32 mmol/L   Glucose, Bld 109 (H) 70 - 99 mg/dL   BUN 14 6 - 20 mg/dL   Creatinine, Ser 0.60  (L) 0.61 - 1.24 mg/dL   Calcium 8.6 (L) 8.9 - 10.3 mg/dL   Total Protein 8.2 (H) 6.5 - 8.1 g/dL   Albumin 3.6 3.5 - 5.0 g/dL   AST 30 15 - 41 U/L   ALT 22 0 - 44 U/L   Alkaline Phosphatase 56 38 - 126 U/L   Total Bilirubin 6.6 (H) 0.3 - 1.2 mg/dL   GFR calc non Af Amer >60 >60 mL/min   GFR calc Af Amer >60 >60 mL/min   Anion gap 9 5 - 15  Lactate dehydrogenase     Status: Abnormal   Collection Time: 09/21/19 11:53 AM  Result Value Ref Range   LDH 330 (H) 98 - 192 U/L    Imaging results:  No results found.   Assessment & Plan: Patient admitted to sickle cell day infusion center for management of pain crisis.  Patient is opiate tolerant Initiate IV dilaudid PCA. Settings of 0.5 mg, 10 minute lockout and 3 mg/hr.  IV fluids, 0.45% saline at 100 ml/hr Tylenol 1000 mg by mouth times one dose Review CBC with differential, complete metabolic panel, and reticulocytes as results become available. Baseline hemoglobin is 8-9 Pain intensity will be reevaluated in context of functioning and relationship to baseline as care progress If pain intensity remains elevated and/or sudden change in hemodynamic stability transition to inpatient services for higher level of care.    Donia Pounds  APRN, [name], [name] [facility] [address] [place], [place] [address] [phone]   09/22/2019, 6:37 AM

## 2019-09-22 NOTE — Telephone Encounter (Signed)
Pt called in medication for Percocet and xanax

## 2019-09-22 NOTE — H&P (Signed)
History and Physical   Martin [name] IWP:[id number] DOB: 02-24-1991 DOA: 09/21/2019  Referring MD/NP/PA: Dr. Zenia Resides  PCP: Azzie Glatter, [name]   Outpatient Specialists: None  Patient coming from: Home  Chief Complaint: Sickle cell pain  HPI: Martin [name] is a 29 y.o. male with medical history significant of sickle cell disease, HIV disease, chronic anemia, anxiety disorder, chronic anticoagulation and chronic pain syndrome who presented to the ER with pain in the back from.  Pain is consistent with his previous sickle cell crisis.  Denied any fever or chills.  Denied any nausea vomiting or diarrhea.  Patient has been taking his regular medications.  He was seen in the acute sickle cell clinic where he was treated all day with for pain management.  He gets better went home but came back to the ER because pain is uncontrolled.  He is being admitted now for inpatient care..  ED Course: Temperature 98.4 blood pressure one 2/65 pulse 93 respirate of 19 oxygen sat 91% room air.  White count 14.8 hemoglobin 8.7 platelets 341.  Chemistry showed a creatinine 1.55 calcium 8.7 otherwise all within normal.  Patient's reticulocyte count absolute is 368 with a percent of 19.9.  This close to his baseline.  He is being admitted for pain management of sickle cell crisis  Review of Systems: As per HPI otherwise 10 point review of systems negative.    Past Medical History:  Diagnosis Date  . Anxiety   . HIV (human immunodeficiency virus infection) ([place])   . Sickle cell crisis ([place])   . 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[place])   . Vitamin D deficiency 10/2018    Past Surgical History:  Procedure Laterality Date  . IR IMAGING GUIDED PORT INSERTION  08/29/2019     reports that he has never smoked. He has never used smokeless tobacco. He reports that he does not drink alcohol or use drugs.  Allergies  Allergen Reactions  . Tape Rash and Other (See Comments)    PLEASE DO NOT USE THE CLEAR, THICK,  "PLASTIC" TAPE- only paper tape is tolerated   . Ketorolac Swelling and Other (See Comments)    Patient reports facial edema and left arm edema after administration.    Family History  Problem Relation Age of Onset  . Sickle cell trait Mother   . Sickle cell trait Father   . Birth defects Maternal Grandmother   . Birth defects Paternal Grandmother      Prior to Admission medications   Medication Sig Start Date End Date Taking? Authorizing Provider  abacavir-dolutegravir-lamiVUDine (TRIUMEQ) 600-50-300 MG tablet Take 1 tablet by mouth at bedtime.     [provider]  ALPRAZolam Duanne Moron) 1 MG tablet Take 1 tablet by mouth 3 times daily as needed for anxiety or sleep. 08/23/19 09/22/19  Azzie Glatter, [name]  apixaban (ELIQUIS) 5 MG TABS tablet Take 1 tablet (5 mg total) by mouth 2 (two) times daily. 08/21/19   [name], [name]  cetirizine (ZYRTEC) 10 MG tablet Take 1 tablet (10 mg total) by mouth daily. 08/04/19   Azzie Glatter, [name]  cholecalciferol (VITAMIN D3) 25 MCG (1000 UNIT) tablet Take 1,000 Units by mouth at bedtime.    [provider]  diphenhydrAMINE (BENADRYL) 25 MG tablet Take 25 mg by mouth every 6 (six) hours as needed for allergies.    [provider]  hydroxyurea (HYDREA) 500 MG capsule Take 4 capsules (2,000 mg total) by mouth at bedtime. 05/26/19  [name], [name]  Multiple Vitamin (MULTIVITAMIN WITH MINERALS) TABS tablet Take 1 tablet by mouth daily with breakfast.     [provider]  naloxone (NARCAN) 0.4 MG/ML injection As needed. Patient not taking: Reported on 08/15/2019 11/28/18   [name], [name]  oxyCODONE-acetaminophen (PERCOCET) 10-325 MG tablet Take 1 tablet by mouth every 4 (four) hours as needed for up to 15 days for pain. 09/09/19 09/24/19  Barbette Merino, NP  Vitamin D, Ergocalciferol, (DRISDOL) 1.25 MG (50000 UT) CAPS capsule Take 1 capsule (50,000 Units total) by mouth every 7 (seven) days. 11/01/18   [name], [name]    Physical Exam: Vitals:   09/21/19 2145 09/21/19 2241 09/21/19 2245 09/21/19 2300  BP: 103/62 104/74 103/67 96/65  Pulse: 78 63 (!) 57 61  Resp: 16 14 16 16   Temp: 98.4 F (36.9 C)     TempSrc: Oral     SpO2: 91% 98% 98% 99%  Weight: 68 kg     Height: 6\' 3"  (1.905 m)         Constitutional: Acutely ill looking, obvious stress Vitals:   09/21/19 2145 09/21/19 2241 09/21/19 2245 09/21/19 2300  BP: 103/62 104/74 103/67 96/65  Pulse: 78 63 (!) 57 61  Resp: 16 14 16 16   Temp: 98.4 F (36.9 C)     TempSrc: Oral     SpO2: 91% 98% 98% 99%  Weight: 68 kg     Height: 6\' 3"  (1.905 m)      Eyes: PERRL, lids and conjunctivae pale mild redness ENMT: Mucous membranes are moist. Posterior pharynx clear of any exudate or lesions.Normal dentition.  Neck: normal, supple, no masses, no thyromegaly Respiratory: clear to auscultation bilaterally, no wheezing, no crackles. Normal respiratory effort. No accessory muscle use.  Cardiovascular: Regular rate and rhythm, no murmurs / rubs / gallops. No extremity edema. 2+ pedal pulses. No carotid bruits.  Abdomen: no tenderness, no masses palpated. No hepatosplenomegaly. Bowel sounds positive.  Musculoskeletal: no clubbing / cyanosis. No joint deformity upper and lower extremities. Good ROM, no contractures. Normal muscle tone.  Skin: no rashes, lesions, ulcers. No induration Neurologic: CN 2-12 grossly intact. Sensation intact, DTR normal. Strength 5/5 in all 4.  Psychiatric: Normal judgment and insight. Alert and oriented x 3. Normal mood.     Labs on Admission: I have personally reviewed following labs and imaging studies  CBC: Recent Labs  Lab 09/21/19 1153 09/21/19 2259  WBC 11.4* 14.8*  NEUTROABS 4.9 5.9  HGB 9.3* 8.7*  HCT 26.3* 24.4*  MCV 114.8* 110.9*  PLT 379 341   Basic Metabolic Panel: Recent Labs  Lab 09/21/19 1153 09/21/19 2259  NA 142 141  K 4.0 3.6  CL 110 110  CO2 23 25  GLUCOSE 109* 86  BUN  14 14  CREATININE 0.60* 0.55*  CALCIUM 8.6* 8.7*   GFR: Estimated Creatinine Clearance: 131 mL/min (A) (by C-G formula based on SCr of 0.55 mg/dL (L)). Liver Function Tests: Recent Labs  Lab 09/21/19 1153 09/21/19 2259  AST 30 29  ALT 22 23  ALKPHOS 56 52  BILITOT 6.6* 6.9*  PROT 8.2* 8.3*  ALBUMIN 3.6 3.7   No results for input(s): LIPASE, AMYLASE in the last 168 hours. No results for input(s): AMMONIA in the last 168 hours. Coagulation Profile: No results for input(s): INR, PROTIME in the last 168 hours. Cardiac Enzymes: No results for input(s): CKTOTAL, CKMB, CKMBINDEX, TROPONINI in the last 168 hours. BNP (last 3  results) No results for input(s): PROBNP in the last 8760 hours. HbA1C: No results for input(s): HGBA1C in the last 72 hours. CBG: No results for input(s): GLUCAP in the last 168 hours. Lipid Profile: No results for input(s): CHOL, HDL, LDLCALC, [name], CHOLHDL, LDLDIRECT in the last 72 hours. Thyroid Function Tests: No results for input(s): TSH, T4TOTAL, FREET4, T3FREE, THYROIDAB in the last 72 hours. Anemia Panel: Recent Labs    09/21/19 1153 09/21/19 2259  RETICCTPCT 19.0* 19.9*   Urine analysis:    Component Value Date/Time   COLORURINE AMBER (A) 03/26/2019 0128   APPEARANCEUR CLEAR 03/26/2019 0128   LABSPEC 1.014 03/26/2019 0128   PHURINE 6.0 03/26/2019 0128   GLUCOSEU NEGATIVE 03/26/2019 0128   HGBUR SMALL (A) 03/26/2019 0128   BILIRUBINUR Negative 08/15/2019 0836   KETONESUR NEGATIVE 03/26/2019 0128   PROTEINUR Negative 08/15/2019 0836   PROTEINUR NEGATIVE 03/26/2019 0128   UROBILINOGEN 4.0 (A) 08/15/2019 0836   UROBILINOGEN 4.0 (H) 05/21/2009 1944   NITRITE Negative 08/15/2019 0836   NITRITE NEGATIVE 03/26/2019 0128   LEUKOCYTESUR Negative 08/15/2019 0836   LEUKOCYTESUR NEGATIVE 03/26/2019 0128   Sepsis Labs: @LABRCNTIP (procalcitonin:4,lacticidven:4) )No results found for this or any previous visit (from the past 240 hour(s)).    Radiological Exams on Admission: No results found.    Assessment/Plan Active Problems:   Sickle cell disease with crisis (HCC)   Generalized anxiety disorder   GERD (gastroesophageal reflux disease)   HIV (human immunodeficiency virus infection) (HCC)     #1 sickle cell pain with crisis: Failed outpatient treatment.  Patient will be admitted and initiated on D5 half-normal saline at 125 cc an hour.  Dilaudid PCA, Toradol and IV fluids.  Continue with his long-acting pain medication.  #2 anemia of chronic disease: H&H remained stable at baseline.  Continue treatment.  #3 chronic anticoagulation: On apixaban.  We will continue with that.  #4 HIV disease: Confirm and resume home regimen of antiretrovirals  #5 GERD: Continue with PPIs  #6 anxiety disorder: Counseled on continue with home regimen.   DVT prophylaxis: Apixaban Code Status: Full code Family Communication: No family at bedside Disposition Plan: Home Consults called: None Admission status: Inpatient  Severity of Illness: The appropriate patient status for this patient is INPATIENT. Inpatient status is judged to be reasonable and necessary in order to provide the required intensity of service to ensure the patient's safety. The patient's presenting symptoms, physical exam findings, and initial radiographic and laboratory data in the context of their chronic comorbidities is felt to place them at high risk for further clinical deterioration. Furthermore, it is not anticipated that the patient will be medically stable for discharge from the hospital within 2 midnights of admission. The following factors support the patient status of inpatient.   " The patient's presenting symptoms include pain all over. " The worrisome physical exam findings include writhing in pain with tenderness all over the bones. " The initial radiographic and laboratory data are worrisome because of stable numbers. " The chronic co-morbidities  include sickle cell disease.   * I certify that at the point of admission it is my clinical judgment that the patient will require inpatient hospital care spanning beyond 2 midnights from the point of admission due to high intensity of service, high risk for further deterioration and high frequency of surveillance required. MD [facility] Pager [phone]  If 7PM-7AM, please contact night-coverage [email or web address] Password [facility]  09/22/2019, 12:44 AM

## 2019-09-22 NOTE — ED Notes (Signed)
Report called for pt transfer to the floor  

## 2019-09-22 NOTE — Discharge Summary (Signed)
Martin Martin   Patient ID: Martin Martin MRN: [id number] DOB/AGE: 01-09-1991 29 y.o.  Admit date: 09/21/2019 Discharge date: 09/22/2019  Primary Care Physician:  Azzie Glatter, [name]  Admission Diagnoses:  Active Problems:   Martin Martin [facility])   Discharge Medications:  Allergies as of 09/21/2019      Reactions   Tape Rash, Other (See Comments)   PLEASE DO NOT USE THE CLEAR, THICK, "PLASTIC" TAPE- only paper tape is tolerated    Ketorolac Swelling, Other (See Comments)   Patient reports facial edema and left arm edema after administration.      Medication List    TAKE these medications   ALPRAZolam 1 MG tablet Commonly known as: XANAX Take 1 tablet by mouth 3 times daily as needed for anxiety or sleep.   apixaban 5 MG Tabs tablet Commonly known as: Eliquis Take 1 tablet (5 mg total) by mouth 2 (two) times daily.   cetirizine 10 MG tablet Commonly known as: ZYRTEC Take 1 tablet (10 mg total) by mouth daily.   cholecalciferol 25 MCG (1000 UNIT) tablet Commonly known as: VITAMIN D3 Take 1,000 Units by mouth at bedtime.   diphenhydrAMINE 25 MG tablet Commonly known as: BENADRYL Take 25 mg by mouth every 6 (six) hours as needed for allergies.   hydroxyurea 500 MG capsule Commonly known as: HYDREA Take 4 capsules (2,000 mg total) by mouth at bedtime.   multivitamin with minerals Tabs tablet Take 1 tablet by mouth daily with breakfast.   naloxone 0.4 MG/ML injection Commonly known as: NARCAN As needed.   oxyCODONE-acetaminophen 10-325 MG tablet Commonly known as: PERCOCET Take 1 tablet by mouth every 4 (four) hours as needed for up to 15 days for pain.   Triumeq 600-50-300 MG tablet Generic drug: abacavir-dolutegravir-lamiVUDine Take 1 tablet by mouth at bedtime.   Vitamin D (Ergocalciferol) 1.25 MG (50000 UNIT) Caps capsule Commonly known as: DRISDOL Take 1 capsule (50,000 Units total) by mouth every 7 (seven)  days.        Consults:  None  Significant Diagnostic Studies:  IR IMAGING GUIDED PORT INSERTION  Result Date: 08/29/2019 INDICATION: History of Martin Martin with frequent admission for Martin cell [name]. Request made for placement of a port a catheter for durable intravenous access. Note, patient previously underwent Port a catheter placement at outside institution however this was removed several months ago due to infection. Patient is currently without infectious symptoms and prolonged conversations were held with the patient regarding proceeding with definitive Port a catheter placement, especially given his young age with complications including but not limited to catheter related infections and or central venous narrowing/occlusion due to placement of a chronic central venous catheter Following this prolonged detailed conversation, the patient wishes to pursue repeat Port a catheter placement. EXAM: IMPLANTED PORT A CATH PLACEMENT WITH ULTRASOUND AND FLUOROSCOPIC GUIDANCE COMPARISON:  None. MEDICATIONS: Benadryl 25 mg IV; Ancef 2 gm IV; The antibiotic was administered within an appropriate time interval prior to skin puncture. ANESTHESIA/SEDATION: Moderate (conscious) sedation was employed during this procedure. A total of Versed 4 mg and Fentanyl 100 mcg was administered intravenously. Moderate Sedation Time: 28 minutes. The patient's level of consciousness and vital signs were monitored continuously by radiology nursing throughout the procedure under my direct supervision. CONTRAST:  None FLUOROSCOPY TIME:  24 seconds (4 mGy) COMPLICATIONS: None immediate. PROCEDURE: The procedure, risks, benefits, and alternatives were explained to the patient. Questions regarding the procedure were encouraged and  answered. The patient understands and consents to the procedure. The right neck and chest were prepped with chlorhexidine in a sterile fashion, and a sterile drape was applied covering the  operative field. Maximum barrier sterile technique with sterile gowns and gloves were used for the procedure. A timeout was performed prior to the initiation of the procedure. Local anesthesia was provided with 1% lidocaine with epinephrine. After creating a small venotomy incision, a micropuncture kit was utilized to access the internal jugular vein. Real-time ultrasound guidance was utilized for vascular access including the acquisition of a permanent ultrasound image documenting patency of the accessed vessel. The microwire was utilized to measure appropriate catheter length. Utilizing the site of the previous Port reservoir incision, a subcutaneous port pocket was then created along the upper chest wall utilizing a combination of sharp and blunt dissection. The pocket was irrigated with sterile saline. A single lumen "Slim" sized power injectable port was chosen for placement. The 8 Fr catheter was tunneled from the port pocket site to the venotomy incision. The port was placed in the pocket. The external catheter was trimmed to appropriate length. At the venotomy, an 8 Fr peel-away sheath was placed over a guidewire under fluoroscopic guidance. The catheter was then placed through the sheath and the sheath was removed. Final catheter positioning was confirmed and documented with a fluoroscopic spot radiograph. The port was accessed with a Huber needle, aspirated and flushed with heparinized saline. The venotomy site was closed with an interrupted 4-0 Vicryl suture. The port pocket incision was closed with interrupted 2-0 Vicryl suture. The skin was opposed with a running subcuticular 4-0 Vicryl suture. Dermabond and Steri-strips were applied to both incisions. Dressings were applied. The patient tolerated the procedure well without immediate post procedural complication. FINDINGS: After catheter placement, the tip lies within the superior cavoatrial junction. The catheter aspirates and flushes normally and is  ready for immediate use. IMPRESSION: Successful placement of a right internal jugular approach power injectable Port-A-Cath. The catheter is ready for immediate use. Electronically Signed   By: Sandi Mariscal M.D.   On: 08/29/2019 17:43   History of present illness:  Martin Martin is a 29 year old male with a medical history significant for Martin Martin, chronic pain syndrome, opiate dependence and tolerance, HIV Martin, history of PE on Eliquis, and history of anemia of chronic Martin presents complaining of low back and bilateral hip pain that is consistent with his typical pain [name].  Patient states that he and with increased pain on yesterday morning that has been unrelieved by home medications.  He last had Percocet this a.m. without sustained relief.  He attributes pain [name] to changes in weather and increased stressors at home.  Pain intensity is 9/10 characterized as constant, throbbing, and occasionally sharp.  Patient denies headache, dizziness, fever, chills, persistent cough, chest pain, urinary symptoms, nausea, vomiting, or diarrhea.  He denies any sick contacts, recent travel, or exposure to COVID-19.  [facility] Course: Patient mated to [facility] for management of pain [name].  All laboratory values reviewed.  Hemoglobin 8.7, appears to be consistent with patient's baseline.  There is no clinical indication for blood transfusion at this time.  WBCs 14.8, patient is afebrile without any signs of infection or inflammation. Pain managed with IV Dilaudid via PCA with settings of 0.5 mg, 10-minute lockout, and 3 mg/h. Tylenol 1000 mg by mouth x1 IV fluids 0.45% saline at 100 mL/h Pain intensity decreased to 6/10.  Patient offered admission, declined.  He says that he can manage at home on current medication regimen.  He is alert, oriented, and ambulating without assistance.  Patient will discharge home in a hemodynamically stable  condition.  Discharge instructions: Resume all home medications.   Follow up with PCP as previously  scheduled.   Discussed the importance of drinking 64 ounces of water daily, dehydration of red blood cells may lead further sickling.   Avoid all stressors that precipitate Martin Martin.     The patient was given clear instructions to go to ER or return to medical center if symptoms do not improve, worsen or new problems develop.     Physical Exam at Discharge:  BP (!) 107/58 (BP Location: Right Arm)   Pulse 93   Temp 98.4 F (36.9 C) (Temporal)   Resp 13   SpO2 93%  Physical Exam Constitutional:      Appearance: Normal appearance.  HENT:     Head: Normocephalic.     Nose: Nose normal.  Eyes:     Pupils: Pupils are equal, round, and reactive to light.  Cardiovascular:     Rate and Rhythm: Normal rate and regular rhythm.  Pulmonary:     Effort: Pulmonary effort is normal.     Breath sounds: Normal breath sounds.  Abdominal:     General: Abdomen is flat. Bowel sounds are normal.  Skin:    General: Skin is warm.  Neurological:     General: No focal deficit present.     Mental Status: He is alert. Mental status is at baseline.  Psychiatric:        Mood and Affect: Mood normal.        Behavior: Behavior normal.        Thought Content: Thought content normal.        Judgment: Judgment normal.      Disposition at Discharge: Discharge disposition: 01-Home or Self Care       Discharge Orders: Discharge Instructions    Discharge patient   Complete by: As directed    Discharge disposition: 01-Home or Self Care   Discharge patient date: 09/21/2019      Condition at Discharge:   Stable  Time spent on Discharge:  Greater than 30 minutes.  Signed: Cammie Martin 09/22/2019, 6:18 AM

## 2019-09-23 MED ORDER — LIP MEDEX EX OINT
TOPICAL_OINTMENT | CUTANEOUS | Status: AC
  Filled 2019-09-23: qty 7

## 2019-09-23 MED ORDER — ACETAMINOPHEN 325 MG PO TABS
650.0000 mg | ORAL_TABLET | ORAL | Status: DC | PRN
  Administered 2019-09-23: 650 mg via ORAL
  Filled 2019-09-23: qty 2

## 2019-09-23 NOTE — Progress Notes (Signed)
Subjective: This is 29 year old gentleman admitted with sickle cell painful crisis.  Patient also has anxiety disorder chronic pain syndrome HIV disease and chronic anticoagulation.  Pain is down to 7 out of 10 today.  On Dilaudid PCA Toradol and IV fluids.  No nausea vomiting or diarrhea.  Objective: Vital signs in last 24 hours: Temp:  [97.6 F (36.4 C)-98.2 F (36.8 C)] 97.6 F (36.4 C) (06/05 0612) Pulse Rate:  [64-84] 64 (06/05 0612) Resp:  [12-17] 15 (06/05 0826) BP: (98-113)/(62-72) 98/62 (06/05 0612) SpO2:  [90 %-98 %] 96 % (06/05 0826) Weight:  [74.1 kg] 74.1 kg (06/05 0612) Weight change: 6.078 kg Last BM Date: 09/22/19  Intake/Output from previous day: 06/04 0701 - 06/05 0700 In: 240 [P.O.:240] Out: 3450 [Urine:3450] Intake/Output this shift: Total I/O In: 240 [P.O.:240] Out: 400 [Urine:400]  General appearance: alert, cooperative, appears stated age and no distress Head: Normocephalic, without obvious abnormality, atraumatic Back: symmetric, no curvature. ROM normal. No CVA tenderness. Resp: clear to auscultation bilaterally Cardio: regular rate and rhythm, S1, S2 normal, no murmur, click, rub or gallop GI: soft, non-tender; bowel sounds normal; no masses,  no organomegaly Extremities: extremities normal, atraumatic, no cyanosis or edema Pulses: 2+ and symmetric Skin: Skin color, texture, turgor normal. No rashes or lesions Neurologic: Grossly normal  Lab Results: Recent Labs    09/21/19 2259 09/22/19 0541  WBC 14.8* 11.2*  HGB 8.7* 8.4*  HCT 24.4* 23.8*  PLT 341 364   BMET Recent Labs    09/21/19 1153 09/21/19 2259  NA 142 141  K 4.0 3.6  CL 110 110  CO2 23 25  GLUCOSE 109* 86  BUN 14 14  CREATININE 0.60* 0.55*  CALCIUM 8.6* 8.7*    Studies/Results: No results found.  Medications: I have reviewed the patient's current medications.  Assessment/Plan: A 29 year old admitted with sickle cell painful crisis  #1 sickle cell painful crisis:  Patient is doing well on Dilaudid PCA Toradol and IV fluids.  We will continue with current regimen.  #2 sickle cell anemia: Hemoglobin is at 8.4 which is close to his baseline.  Continue to monitor  #3 leukocytosis: Continue to monitor white count.  #4 chronic pain syndrome: Continue home regimen.  #5 HIV disease: Patient on home regimen.  Continue   LOS: 1 day   Hulbert Branscome,LAWAL 09/23/2019, 10:14 AM

## 2019-09-24 ENCOUNTER — Encounter (HOSPITAL_COMMUNITY): Payer: Self-pay | Admitting: Internal Medicine

## 2019-09-24 MED ORDER — LIP MEDEX EX OINT
TOPICAL_OINTMENT | CUTANEOUS | Status: AC
  Filled 2019-09-24: qty 7

## 2019-09-24 NOTE — Progress Notes (Signed)
Subjective: Pain is much improved today down to 7 out of 10.  Patient is eating and drinking.  No fever or chills no nausea vomiting or diarrhea.   Objective: Vital signs in last 24 hours: Temp:  [98.3 F (36.8 C)-99.1 F (37.3 C)] 99.1 F (37.3 C) (06/06 2113) Pulse Rate:  [83-93] 93 (06/06 2113) Resp:  [12-17] 14 (06/06 2321) BP: (105-125)/(66-81) 121/81 (06/06 2113) SpO2:  [90 %-97 %] 90 % (06/06 2321) Weight:  [72.9 kg] 72.9 kg (06/06 0625) Weight change: -1.225 kg Last BM Date: 09/21/19  Intake/Output from previous day: 06/05 0701 - 06/06 0700 In: 240 [P.O.:240] Out: 1000 [Urine:1000] Intake/Output this shift: No intake/output data recorded.  General appearance: alert, cooperative, appears stated age and no distress Head: Normocephalic, without obvious abnormality, atraumatic Back: symmetric, no curvature. ROM normal. No CVA tenderness. Resp: clear to auscultation bilaterally Cardio: regular rate and rhythm, S1, S2 normal, no murmur, click, rub or gallop GI: soft, non-tender; bowel sounds normal; no masses,  no organomegaly Extremities: extremities normal, atraumatic, no cyanosis or edema Pulses: 2+ and symmetric Skin: Skin color, texture, turgor normal. No rashes or lesions Neurologic: Grossly normal  Lab Results: Recent Labs    09/22/19 0541  WBC 11.2*  HGB 8.4*  HCT 23.8*  PLT 364   BMET No results for input(s): NA, K, CL, CO2, GLUCOSE, BUN, CREATININE, CALCIUM in the last 72 hours.  Studies/Results: No results found.  Medications: I have reviewed the patient's current medications.  Assessment/Plan: A 29 year old admitted with sickle cell painful crisis  #1 sickle cell painful crisis: Patient is doing well on Dilaudid PCA Toradol and IV fluids.  We will continue with current regimen.  Encourage patient to mobilize.  #2 sickle cell anemia: Hemoglobin is at 8.4 which is close to his baseline.  Continue to monitor  #3 leukocytosis: Continue to monitor  white count.  #4 chronic pain syndrome: Continue home regimen.  #5 HIV disease: Patient on home regimen.  Continue   LOS: 2 days   Vera Wishart,LAWAL 09/24/2019, 11:43 PM

## 2019-09-25 ENCOUNTER — Other Ambulatory Visit: Payer: Self-pay | Admitting: Family Medicine

## 2019-09-25 DIAGNOSIS — F411 Generalized anxiety disorder: Secondary | ICD-10-CM

## 2019-09-25 DIAGNOSIS — F419 Anxiety disorder, unspecified: Secondary | ICD-10-CM

## 2019-09-25 DIAGNOSIS — D571 Sickle-cell disease without crisis: Secondary | ICD-10-CM

## 2019-09-25 DIAGNOSIS — R17 Unspecified jaundice: Secondary | ICD-10-CM

## 2019-09-25 DIAGNOSIS — G8929 Other chronic pain: Secondary | ICD-10-CM

## 2019-09-25 DIAGNOSIS — D5700 Hb-SS disease with crisis, unspecified: Principal | ICD-10-CM

## 2019-09-25 LAB — COMPREHENSIVE METABOLIC PANEL
ALT: 24 U/L (ref 0–44)
AST: 37 U/L (ref 15–41)
Albumin: 3.9 g/dL (ref 3.5–5.0)
Alkaline Phosphatase: 55 U/L (ref 38–126)
Anion gap: 7 (ref 5–15)
BUN: 12 mg/dL (ref 6–20)
CO2: 28 mmol/L (ref 22–32)
Calcium: 8.7 mg/dL — ABNORMAL LOW (ref 8.9–10.3)
Chloride: 99 mmol/L (ref 98–111)
Creatinine, Ser: 0.77 mg/dL (ref 0.61–1.24)
GFR calc Af Amer: >60 mL/min (ref >60)
GFR calc non Af Amer: >60 mL/min (ref >60)
Glucose, Bld: 107 mg/dL — ABNORMAL HIGH (ref 70–99)
Potassium: 3.8 mmol/L (ref 3.5–5.1)
Sodium: 134 mmol/L — ABNORMAL LOW (ref 135–145)
Total Bilirubin: 6 mg/dL — ABNORMAL HIGH (ref 0.3–1.2)
Total Protein: 8.6 g/dL — ABNORMAL HIGH (ref 6.5–8.1)

## 2019-09-25 LAB — CBC WITH DIFFERENTIAL/PLATELET
Abs Immature Granulocytes: 0.05 10*3/uL (ref 0.00–0.07)
Basophils Absolute: 0 10*3/uL (ref 0.0–0.1)
Basophils Relative: 0 %
Eosinophils Absolute: 0.2 10*3/uL (ref 0.0–0.5)
Eosinophils Relative: 2 %
HCT: 23.3 % — ABNORMAL LOW (ref 39.0–52.0)
Hemoglobin: 8.7 g/dL — ABNORMAL LOW (ref 13.0–17.0)
Immature Granulocytes: 0 %
Lymphocytes Relative: 37 %
Lymphs Abs: 4.3 10*3/uL — ABNORMAL HIGH (ref 0.7–4.0)
MCH: 39.9 pg — ABNORMAL HIGH (ref 26.0–34.0)
MCHC: 37.3 g/dL — ABNORMAL HIGH (ref 30.0–36.0)
MCV: 106.9 fL — ABNORMAL HIGH (ref 80.0–100.0)
Monocytes Absolute: 1 10*3/uL (ref 0.1–1.0)
Monocytes Relative: 9 %
Neutro Abs: 6 10*3/uL (ref 1.7–7.7)
Neutrophils Relative %: 52 %
Platelets: 377 10*3/uL (ref 150–400)
RBC: 2.18 MIL/uL — ABNORMAL LOW (ref 4.22–5.81)
RDW: 22.5 % — ABNORMAL HIGH (ref 11.5–15.5)
WBC: 11.6 10*3/uL — ABNORMAL HIGH (ref 4.0–10.5)
nRBC: 1.9 % — ABNORMAL HIGH (ref 0.0–0.2)

## 2019-09-25 MED ORDER — HEPARIN SOD (PORK) LOCK FLUSH 100 UNIT/ML IV SOLN
500.0000 [IU] | INTRAVENOUS | Status: DC
  Administered 2019-09-25: 500 [IU]

## 2019-09-25 MED ORDER — HEPARIN SOD (PORK) LOCK FLUSH 100 UNIT/ML IV SOLN
500.0000 [IU] | INTRAVENOUS | Status: DC | PRN
  Filled 2019-09-25: qty 5

## 2019-09-25 MED ORDER — ALPRAZOLAM 1 MG PO TABS: ORAL_TABLET | ORAL | 0 refills | Status: DC

## 2019-09-25 NOTE — Progress Notes (Signed)
Pt discharged home in stable condition. Discharge instructions given. Scripts sent to pharmacy of choice. No immediate questions or concerns at this time. Pt opted to ambulate off the department.

## 2019-09-25 NOTE — Discharge Instructions (Signed)
Sickle Cell Anemia, Adult  Sickle cell anemia is a condition where your red blood cells are shaped like sickles. Red blood cells carry oxygen through the body. Sickle-shaped cells do not live as long as normal red blood cells. They also clump together and block blood from flowing through the blood vessels. This prevents the body from getting enough oxygen. Sickle cell anemia causes organ damage and pain. It also increases the risk of infection. Follow these instructions at home: Medicines  Take over-the-counter and prescription medicines only as told by your doctor.  If you were prescribed an antibiotic medicine, take it as told by your doctor. Do not stop taking the antibiotic even if you start to feel better.  If you develop a fever, do not take medicines to lower the fever right away. Tell your doctor about the fever. Managing pain, stiffness, and swelling  Try these methods to help with pain: ? Use a heating pad. ? Take a warm bath. ? Distract yourself, such as by watching TV. Eating and drinking  Drink enough fluid to keep your pee (urine) clear or pale yellow. Drink more in hot weather and during exercise.  Limit or avoid alcohol.  Eat a healthy diet. Eat plenty of fruits, vegetables, whole grains, and lean protein.  Take vitamins and supplements as told by your doctor. Traveling  When traveling, keep these with you: ? Your medical information. ? The names of your doctors. ? Your medicines.  If you need to take an airplane, talk to your doctor first. Activity  Rest often.  Avoid exercises that make your heart beat much faster, such as jogging. General instructions  Do not use products that have nicotine or tobacco, such as cigarettes and e-cigarettes. If you need help quitting, ask your doctor.  Consider wearing a medical alert bracelet.  Avoid being in high places (high altitudes), such as mountains.  Avoid very hot or cold temperatures.  Avoid places where the  temperature changes a lot.  Keep all follow-up visits as told by your doctor. This is important. Contact a doctor if:  A joint hurts.  Your feet or hands hurt or swell.  You feel tired (fatigued). Get help right away if:  You have symptoms of infection. These include: ? Fever. ? Chills. ? Being very tired. ? Irritability. ? Poor eating. ? Throwing up (vomiting).  You feel dizzy or faint.  You have new stomach pain, especially on the left side.  You have a an erection (priapism) that lasts more than 4 hours.  You have numbness in your arms or legs.  You have a hard time moving your arms or legs.  You have trouble talking.  You have pain that does not go away when you take medicine.  You are short of breath.  You are breathing fast.  You have a long-term cough.  You have pain in your chest.  You have a bad headache.  You have a stiff neck.  Your stomach looks bloated even though you did not eat much.  Your skin is pale.  You suddenly cannot see well. Summary  Sickle cell anemia is a condition where your red blood cells are shaped like sickles.  Follow your doctor's advice on ways to manage pain, food to eat, activities to do, and steps to take for safe travel.  Get medical help right away if you have any signs of infection, such as a fever. This information is not intended to replace advice given to you by   your health care provider. Make sure you discuss any questions you have with your health care provider. Document Revised: 07/29/2018 Document Reviewed: 05/12/2016 Elsevier Patient Education  2020 Elsevier Inc.  

## 2019-09-26 ENCOUNTER — Telehealth: Payer: Self-pay | Admitting: Family Medicine

## 2019-09-26 ENCOUNTER — Other Ambulatory Visit: Payer: Self-pay | Admitting: Family Medicine

## 2019-09-26 DIAGNOSIS — D571 Sickle-cell disease without crisis: Secondary | ICD-10-CM

## 2019-09-26 DIAGNOSIS — G8929 Other chronic pain: Secondary | ICD-10-CM

## 2019-09-26 MED ORDER — OXYCODONE-ACETAMINOPHEN 10-325 MG PO TABS: 1.0000 | ORAL_TABLET | ORAL | 0 refills | Status: DC | PRN

## 2019-09-26 NOTE — Telephone Encounter (Signed)
Pt called again regarding percocet

## 2019-09-26 NOTE — Telephone Encounter (Signed)
Can you please resend percocet to [facility] in [place], because pt called stating they don't have the prescription in the system.

## 2019-09-26 NOTE — Telephone Encounter (Signed)
error 

## 2019-09-27 ENCOUNTER — Ambulatory Visit (INDEPENDENT_AMBULATORY_CARE_PROVIDER_SITE_OTHER): Payer: Medicaid Other | Admitting: Family Medicine

## 2019-09-27 ENCOUNTER — Encounter: Payer: Self-pay | Admitting: Family Medicine

## 2019-09-27 ENCOUNTER — Other Ambulatory Visit: Payer: Self-pay

## 2019-09-27 VITALS — BP 110/70 | HR 119 | Temp 99.1°F | Ht 75.0 in | Wt 143.0 lb

## 2019-09-27 DIAGNOSIS — G8929 Other chronic pain: Secondary | ICD-10-CM | POA: Diagnosis not present

## 2019-09-27 DIAGNOSIS — Z09 Encounter for follow-up examination after completed treatment for conditions other than malignant neoplasm: Secondary | ICD-10-CM | POA: Diagnosis not present

## 2019-09-27 DIAGNOSIS — F1190 Opioid use, unspecified, uncomplicated: Secondary | ICD-10-CM

## 2019-09-27 DIAGNOSIS — R17 Unspecified jaundice: Secondary | ICD-10-CM

## 2019-09-27 DIAGNOSIS — D571 Sickle-cell disease without crisis: Secondary | ICD-10-CM | POA: Diagnosis not present

## 2019-09-27 DIAGNOSIS — F411 Generalized anxiety disorder: Secondary | ICD-10-CM

## 2019-09-27 LAB — POCT URINALYSIS DIPSTICK
Blood, UA: NEGATIVE
Glucose, UA: NEGATIVE
Leukocytes, UA: NEGATIVE
Nitrite, UA: NEGATIVE
Protein, UA: POSITIVE — AB
Spec Grav, UA: 1.01 (ref 1.010–1.025)
Urobilinogen, UA: 2 E.U./dL — AB
pH, UA: 6 (ref 5.0–8.0)

## 2019-09-27 NOTE — Progress Notes (Signed)
[facility]   Established Patient Office Visit  Subjective:  Patient ID: KORDELL JAFRI, male    DOB: [birth year]  Age: 29 y.o. MRN: [id number]  CC:  Chief Complaint  Patient presents with  . Hospitalization Follow-up    Sickle Cell 6/3    HPI Serge X Otterness is a 29 year old who presents for Follow Up today.   Past Medical History:  Diagnosis Date  . Anxiety   . HIV (human immunodeficiency virus infection) (HCC)   . Sickle cell crisis (HCC)   . 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HCC)   . Vitamin D deficiency 10/2018    Past Surgical History:  Procedure Laterality Date  . IR IMAGING GUIDED PORT INSERTION  08/29/2019   Current Status: Since his last office visit, he is doing well with no complaints. He states that he has pain is in his hips and joint. He rates his pain today at 2/10. He has not had a hospital visit for Sickle Cell Crisis since 09/21/2019-6/7/201 where he was treated and discharged the same day. He is currently taking all medications as prescribed and staying well hydrated. He reports occasional nausea, constipation, dizziness and headaches. He denies fevers, chills, fatigue, recent infections, weight loss, and night sweats. He has not had any visual changes, and falls. No chest pain, heart palpitations, cough and shortness of breath reported. Denies GI problems such as diarrhea, and constipation. He has no reports of blood in stools, dysuria and hematuria. No depression or anxiety, and denies suicidal ideations, homicidal ideations, or auditory hallucinations. He is taking all medications as prescribed. He denies pain today.   Family History  Problem Relation Age of Onset  . Sickle cell trait Mother   . Sickle cell trait Father   . Birth defects Maternal Grandmother   . Birth defects Paternal Grandmother     Social History   Socioeconomic History  . Marital status: Single    Spouse name: Not on file  . Number of  children: Not on file  . Years of education: Not on file  . Highest education level: Not on file  Occupational History  . Not on file  Tobacco Use  . Smoking status: Never Smoker  . Smokeless tobacco: Never Used  Substance and Sexual Activity  . Alcohol use: No  . Drug use: Yes    Types: Marijuana  . Sexual activity: Yes    Birth control/protection: Condom  Other Topics Concern  . Not on file  Social History Narrative  . Not on file   Social Determinants of Health   Financial Resource Strain:   . Difficulty of Paying Living Expenses:   Food Insecurity:   . Worried About Programme researcher, broadcasting/film/video in the Last Year:   . Barista in the Last Year:   Transportation Needs:   . Freight forwarder (Medical):   Marland Kitchen Lack of Transportation (Non-Medical):   Physical Activity:   . Days of Exercise per Week:   . Minutes of Exercise per Session:   Stress:   . Feeling of Stress :   Social Connections:   . Frequency of Communication with Friends and Family:   . Frequency of Social Gatherings with Friends and Family:   . Attends Religious Services:   . Active Member of Clubs or Organizations:   . Attends Banker Meetings:   Marland Kitchen Marital Status:   Intimate Partner Violence:   . Fear of  Current or Ex-Partner:   . Emotionally Abused:   Marland Kitchen Physically Abused:   . Sexually Abused:     Outpatient Medications Prior to Visit  Medication Sig Dispense Refill  . abacavir-dolutegravir-lamiVUDine (TRIUMEQ) 600-50-300 MG tablet Take 1 tablet by mouth at bedtime.     . ALPRAZolam (XANAX) 1 MG tablet Take 1 tablet by mouth 3 times daily as needed for anxiety or sleep. 30 tablet 0  . apixaban (ELIQUIS) 5 MG TABS tablet Take 1 tablet (5 mg total) by mouth 2 (two) times daily. 60 tablet 5  . cetirizine (ZYRTEC) 10 MG tablet Take 1 tablet (10 mg total) by mouth daily. 30 tablet 11  . cholecalciferol (VITAMIN D3) 25 MCG (1000 UNIT) tablet Take 1,000 Units by mouth at bedtime.    .  diphenhydrAMINE (BENADRYL) 25 MG tablet Take 25 mg by mouth every 6 (six) hours as needed for allergies.    . hydroxyurea (HYDREA) 500 MG capsule Take 4 capsules (2,000 mg total) by mouth at bedtime. 120 capsule 3  . Multiple Vitamin (MULTIVITAMIN WITH MINERALS) TABS tablet Take 1 tablet by mouth daily with breakfast.     . naloxone (NARCAN) nasal spray 4 mg/0.1 mL Place 1 spray into the nose once as needed (opiod overdose).    Marland Kitchen oxyCODONE-acetaminophen (PERCOCET) 10-325 MG tablet Take 1 tablet by mouth every 4 (four) hours as needed for pain. 90 tablet 0  . promethazine (PHENERGAN) 12.5 MG tablet Take 12.5 mg by mouth 3 (three) times daily as needed for nausea/vomiting.    . Vitamin D, Ergocalciferol, (DRISDOL) 1.25 MG (50000 UT) CAPS capsule Take 1 capsule (50,000 Units total) by mouth every 7 (seven) days. (Patient taking differently: Take 50,000 Units by mouth every Tuesday. ) 5 capsule 3  . naloxone (NARCAN) 0.4 MG/ML injection As needed. (Patient not taking: Reported on 08/15/2019) 1 mL 2   No facility-administered medications prior to visit.    Allergies  Allergen Reactions  . Tape Rash and Other (See Comments)    PLEASE DO NOT USE THE CLEAR, THICK, "PLASTIC" TAPE- only paper tape is tolerated   . Ketorolac Swelling and Other (See Comments)    Patient reports facial edema and left arm edema after administration.    ROS Review of Systems  Constitutional: Negative.   HENT: Negative.   Eyes: Negative.   Respiratory: Positive for shortness of breath (occassional ).   Cardiovascular: Negative.   Gastrointestinal: Positive for constipation (occasional ) and nausea (occasional ).  Endocrine: Negative.   Genitourinary: Negative.   Musculoskeletal: Positive for arthralgias (generalized joint pain).  Skin: Negative.   Allergic/Immunologic: Negative.   Neurological: Positive for dizziness (occasional ) and headaches (occasional ).  Hematological: Negative.   Psychiatric/Behavioral:  Negative.    Objective:    Physical Exam  Constitutional: He is oriented to person, place, and time. He appears well-developed and well-nourished.  HENT:  Head: Normocephalic and atraumatic.  Eyes: Conjunctivae are normal.  Cardiovascular: Normal rate, regular rhythm, normal heart sounds and intact distal pulses.  Pulmonary/Chest: Effort normal and breath sounds normal.  Abdominal: Soft. Bowel sounds are normal.  Musculoskeletal:        General: Normal range of motion.     Cervical back: Normal range of motion and neck supple.  Neurological: He is alert and oriented to person, place, and time. He has normal reflexes.  Skin: Skin is warm and dry.  Psychiatric: He has a normal mood and affect. His behavior is normal. Judgment and thought content  normal.  Nursing note and vitals reviewed.   BP 110/70 (BP Location: Left Arm, Patient Position: Sitting, Cuff Size: Small)   Pulse (!) 119   Temp 99.1 F (37.3 C)   Ht 6\' 3"  (1.905 m)   Wt 143 lb 0.4 oz (64.9 kg)   SpO2 94%   BMI 17.88 kg/m  Wt Readings from Last 3 Encounters:  09/27/19 143 lb 0.4 oz (64.9 kg)  09/24/19 160 lb 11.2 oz (72.9 kg)  08/15/19 141 lb 3.2 oz (64 kg)     There are no preventive care reminders to display for this patient.  There are no preventive care reminders to display for this patient.  No results found for: TSH Lab Results  Component Value Date   WBC 11.6 (H) 09/25/2019   HGB 8.7 (L) 09/25/2019   HCT 23.3 (L) 09/25/2019   MCV 106.9 (H) 09/25/2019   PLT 377 09/25/2019   Lab Results  Component Value Date   NA 134 (L) 09/25/2019   K 3.8 09/25/2019   CO2 28 09/25/2019   GLUCOSE 107 (H) 09/25/2019   BUN 12 09/25/2019   CREATININE 0.77 09/25/2019   BILITOT 6.0 (H) 09/25/2019   ALKPHOS 55 09/25/2019   AST 37 09/25/2019   ALT 24 09/25/2019   PROT 8.6 (H) 09/25/2019   ALBUMIN 3.9 09/25/2019   CALCIUM 8.7 (L) 09/25/2019   ANIONGAP 7 09/25/2019   No results found for: CHOL No results found  for: HDL No results found for: LDLCALC No results found for: [name] No results found for: CHOLHDL Lab Results  Component Value Date   HGBA1C (L) 05/22/2009    <4.0 (NOTE) The ADA recommends the following therapeutic goal for glycemic control related to Hgb A1c measurement: Goal of therapy: <6.5 Hgb A1c  Reference: American Diabetes Association: Clinical Practice Recommendations 2010, Diabetes Care, 2010, 33: (Suppl  1).      Assessment & Plan:   1. Hospital discharge follow-up  2. Hb-SS disease without crisis [facility]) He is doing well today r/t his chronic pain management. He will continue to take pain medications as prescribed; will continue to avoid extreme heat and cold; will continue to eat a healthy diet and drink at least 64 ounces of water daily; continue stool softener as needed; will avoid colds and flu; will continue to get plenty of sleep and rest; will continue to avoid high stressful situations and remain infection free; will continue Folic Acid 1 mg daily to avoid sickle cell crisis. Continue to follow up with Hematologist as needed.  - Urinalysis Dipstick  3. Other chronic pain  4. Chronic, continuous use of opioids  5. Generalized anxiety disorder Stable today.   6. Follow up She will follow up in 2 months.   No orders of the defined types were placed in this encounter.   Orders Placed This Encounter  Procedures  . Urinalysis Dipstick    Referral Orders  No referral(s) requested today    [facility],  [name], [name]-BC [facility] [address] [place], [place] [phone] fax  Problem List Items Addressed This Visit      Other   Chronic, continuous use of opioids   Generalized anxiety disorder   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HCC)   Relevant Orders   Urinalysis Dipstick (Completed)    Other Visit Diagnoses    Hospital discharge follow-up    -  Primary   Other  chronic pain  Anxiety       Follow up          No orders of the defined types were placed in this encounter.   Follow-up: Return in about 2 months (around 11/27/2019).    Azzie Glatter, [name]

## 2019-09-27 NOTE — Discharge Summary (Signed)
Physician Discharge Summary  Martin [name] WUJ:[id number] DOB: 10/03/1990 DOA: 09/21/2019  PCP: Azzie Glatter, [name]  Admit date: 09/21/2019  Discharge date: 09/27/2019  Discharge Diagnoses:  Active Problems:   Sickle cell disease with crisis ([place])   Generalized anxiety disorder   GERD (gastroesophageal reflux disease)   HIV (human immunodeficiency virus infection) ([place])   Sickle cell anemia with crisis [facility])   Discharge Condition: Stable  Disposition:  Pt is discharged home in good condition and is to follow up with Azzie Glatter, [name] this week to have labs evaluated. Martin [name] is instructed to increase activity slowly and balance with rest for the next few days, and use prescribed medication to complete treatment of pain  Diet: Regular Wt Readings from Last 3 Encounters:  09/27/19 64.9 kg  09/24/19 72.9 kg  08/15/19 64 kg    History of present illness:  Martin [name] is a 29 year old male with a medical history significant for sickle cell disease type SS, chronic pain syndrome, opiate dependence and tolerance, history of anemia of chronic disease, and HIV disease presented to the emergency department with complaints of increased pain. Patient was admitted to sickle cell day infusion center one day prior for this problem. He says that pain improved prior to discharge, but increased after returning home. Pain is primarily to low back. Patient has been taking home medications consistently without improvement. Patient admitted for management of sickle cell pain crisis.   ER course:  Temperature 98.4, pulse 93, respirations 19, oxygen saturation 91% on RA.  WBC count 14.8, hemoglobin 8.7, platelets 341.  Chemistry panel showed a creatinine of 1.55, calcium 8.7, otherwise all within a normal range.  Patient's reticulocyte count is 368 with a percent of 19.9.  This appears to be close to patient's baseline.  Pain persists despite IV pain medications.  Patient admitted  for sickle cell pain crisis.  Hospital Course:  Sickle cell disease with pain crisis:  Patient was admitted for sickle cell pain crisis and managed appropriately with IVF, IV Dilaudid via PCA, as well as other adjunct therapies per sickle cell pain management protocols.  IV Dilaudid PCA weaned appropriately.  Patient transition to home opiate medication regimen.  He says the pain intensity has decreased to 3/10.  Patient is requesting discharge home.  Patient has a follow-up scheduled with PCP for medication management.  At that time, repeat CBC with differential and CMP.  Patient's hemoglobin remained stable throughout admission.  Patient has a history of HIV disease that has been well controlled.  He has continued antiviral medications throughout admission without interruption. Patient is followed by infectious disease specialist.  Patient is alert, oriented, and ambulating without assistance.  He is afebrile and oxygen saturation is 95% on RA.  Patient was discharged home today in a hemodynamically stable condition.   Discharge Exam: Vitals:   09/25/19 1128 09/25/19 1620  BP:  123/77  Pulse:  (!) 101  Resp: 16 19  Temp:  98.6 F (37 C)  SpO2:  (!) 87%   Vitals:   09/25/19 0749 09/25/19 0945 09/25/19 1128 09/25/19 1620  BP:  114/76  123/77  Pulse:  98  (!) 101  Resp: '15 14 16 19  ' Temp:  98.5 F (36.9 C)  98.6 F (37 C)  TempSrc:  Oral  Oral  SpO2: 92% 91%  (!) 87%  Weight:      Height:        General appearance : Awake, alert, not in any  distress. Speech Clear. Not toxic looking HEENT: Atraumatic and Normocephalic, pupils equally reactive to light and accomodation Neck: Supple, no JVD. No cervical lymphadenopathy.  Chest: Good air entry bilaterally, no added sounds  CVS: S1 S2 regular, no murmurs.  Abdomen: Bowel sounds present, Non tender and not distended with no gaurding, rigidity or rebound. Extremities: B/L Lower Ext shows no edema, both legs are warm to  touch Neurology: Awake alert, and oriented X 3, CN II-XII intact, Non focal Skin: No Rash  Discharge Instructions  Discharge Instructions    Discharge patient   Complete by: As directed    Discharge disposition: 01-Home or Self Care   Discharge patient date: 09/25/2019     Allergies as of 09/25/2019      Reactions   Tape Rash, Other (See Comments)   PLEASE DO NOT USE THE CLEAR, THICK, "PLASTIC" TAPE- only paper tape is tolerated    Ketorolac Swelling, Other (See Comments)   Patient reports facial edema and left arm edema after administration.      Medication List    STOP taking these medications   oxyCODONE-acetaminophen 10-325 MG tablet Commonly known as: PERCOCET     TAKE these medications   ALPRAZolam 1 MG tablet Commonly known as: XANAX Take 1 tablet by mouth 3 times daily as needed for anxiety or sleep.   apixaban 5 MG Tabs tablet Commonly known as: Eliquis Take 1 tablet (5 mg total) by mouth 2 (two) times daily.   cetirizine 10 MG tablet Commonly known as: ZYRTEC Take 1 tablet (10 mg total) by mouth daily.   cholecalciferol 25 MCG (1000 UNIT) tablet Commonly known as: VITAMIN D3 Take 1,000 Units by mouth at bedtime.   diphenhydrAMINE 25 MG tablet Commonly known as: BENADRYL Take 25 mg by mouth every 6 (six) hours as needed for allergies.   hydroxyurea 500 MG capsule Commonly known as: HYDREA Take 4 capsules (2,000 mg total) by mouth at bedtime.   multivitamin with minerals Tabs tablet Take 1 tablet by mouth daily with breakfast.   naloxone 4 MG/0.1ML Liqd nasal spray kit Commonly known as: NARCAN Place 1 spray into the nose once as needed (opiod overdose).   naloxone 0.4 MG/ML injection Commonly known as: NARCAN As needed.   promethazine 12.5 MG tablet Commonly known as: PHENERGAN Take 12.5 mg by mouth 3 (three) times daily as needed for nausea/vomiting.   Triumeq 600-50-300 MG tablet Generic drug: abacavir-dolutegravir-lamiVUDine Take 1 tablet  by mouth at bedtime.   Vitamin D (Ergocalciferol) 1.25 MG (50000 UNIT) Caps capsule Commonly known as: DRISDOL Take 1 capsule (50,000 Units total) by mouth every 7 (seven) days. What changed: when to take this       The results of significant diagnostics from this hospitalization (including imaging, microbiology, ancillary and laboratory) are listed below for reference.    Significant Diagnostic Studies: IR IMAGING GUIDED PORT INSERTION  Result Date: 08/29/2019 INDICATION: History of sickle cell disease with frequent admission for sickle cell crisis. Request made for placement of a port a catheter for durable intravenous access. Note, patient previously underwent Port a catheter placement at outside institution however this was removed several months ago due to infection. Patient is currently without infectious symptoms and prolonged conversations were held with the patient regarding proceeding with definitive Port a catheter placement, especially given his young age with complications including but not limited to catheter related infections and or central venous narrowing/occlusion due to placement of a chronic central venous catheter Following this prolonged detailed  conversation, the patient wishes to pursue repeat Port a catheter placement. EXAM: IMPLANTED PORT A CATH PLACEMENT WITH ULTRASOUND AND FLUOROSCOPIC GUIDANCE COMPARISON:  None. MEDICATIONS: Benadryl 25 mg IV; Ancef 2 gm IV; The antibiotic was administered within an appropriate time interval prior to skin puncture. ANESTHESIA/SEDATION: Moderate (conscious) sedation was employed during this procedure. A total of Versed 4 mg and Fentanyl 100 mcg was administered intravenously. Moderate Sedation Time: 28 minutes. The patient's level of consciousness and vital signs were monitored continuously by radiology nursing throughout the procedure under my direct supervision. CONTRAST:  None FLUOROSCOPY TIME:  24 seconds (4 mGy) COMPLICATIONS: None  immediate. PROCEDURE: The procedure, risks, benefits, and alternatives were explained to the patient. Questions regarding the procedure were encouraged and answered. The patient understands and consents to the procedure. The right neck and chest were prepped with chlorhexidine in a sterile fashion, and a sterile drape was applied covering the operative field. Maximum barrier sterile technique with sterile gowns and gloves were used for the procedure. A timeout was performed prior to the initiation of the procedure. Local anesthesia was provided with 1% lidocaine with epinephrine. After creating a small venotomy incision, a micropuncture kit was utilized to access the internal jugular vein. Real-time ultrasound guidance was utilized for vascular access including the acquisition of a permanent ultrasound image documenting patency of the accessed vessel. The microwire was utilized to measure appropriate catheter length. Utilizing the site of the previous Port reservoir incision, a subcutaneous port pocket was then created along the upper chest wall utilizing a combination of sharp and blunt dissection. The pocket was irrigated with sterile saline. A single lumen "Slim" sized power injectable port was chosen for placement. The 8 Fr catheter was tunneled from the port pocket site to the venotomy incision. The port was placed in the pocket. The external catheter was trimmed to appropriate length. At the venotomy, an 8 Fr peel-away sheath was placed over a guidewire under fluoroscopic guidance. The catheter was then placed through the sheath and the sheath was removed. Final catheter positioning was confirmed and documented with a fluoroscopic spot radiograph. The port was accessed with a Huber needle, aspirated and flushed with heparinized saline. The venotomy site was closed with an interrupted 4-0 Vicryl suture. The port pocket incision was closed with interrupted 2-0 Vicryl suture. The skin was opposed with a running  subcuticular 4-0 Vicryl suture. Dermabond and Steri-strips were applied to both incisions. Dressings were applied. The patient tolerated the procedure well without immediate post procedural complication. FINDINGS: After catheter placement, the tip lies within the superior cavoatrial junction. The catheter aspirates and flushes normally and is ready for immediate use. IMPRESSION: Successful placement of a right internal jugular approach power injectable Port-A-Cath. The catheter is ready for immediate use. Electronically Signed   By: Sandi Mariscal M.D.   On: 08/29/2019 17:43    Microbiology: Recent Results (from the past 240 hour(s))  SARS Coronavirus 2 by RT PCR (hospital order, performed in [facility] hospital lab) Nasopharyngeal Nasopharyngeal Swab     Status: None   Collection Time: 09/22/19  1:30 AM   Specimen: Nasopharyngeal Swab  Result Value Ref Range Status   SARS Coronavirus 2 NEGATIVE NEGATIVE Final    Comment: (NOTE) SARS-CoV-2 target nucleic acids are NOT DETECTED. The SARS-CoV-2 RNA is generally detectable in upper and lower respiratory specimens during the acute phase of infection. The lowest concentration of SARS-CoV-2 viral copies this assay can detect is 250 copies / mL. A negative result does  not preclude SARS-CoV-2 infection and should not be used as the sole basis for treatment or other patient management decisions.  A negative result may occur with improper specimen collection / handling, submission of specimen other than nasopharyngeal swab, presence of viral mutation(s) within the areas targeted by this assay, and inadequate number of viral copies (<250 copies / mL). A negative result must be combined with clinical observations, patient history, and epidemiological information. Fact Sheet for Patients:   StrictlyIdeas.no Fact Sheet for Healthcare Providers: BankingDealers.co.za This test is not yet approved or cleared  by  the [place] FDA and has been authorized for detection and/or diagnosis of SARS-CoV-2 by FDA under an Emergency Use Authorization (EUA).  This EUA will remain in effect (meaning this test can be used) for the duration of the COVID-19 declaration under Section 564(b)(1) of the Act, 21 U.S.C. section 360bbb-3(b)(1), unless the authorization is terminated or revoked sooner. Performed at [facility], [place] [address], [place], [place] [address]      Labs: Basic Metabolic Panel: Recent Labs  Lab 09/21/19 1153 09/21/19 1153 09/21/19 2259 09/25/19 0603  NA 142  --  141 134*  K 4.0   < > 3.6 3.8  CL 110  --  110 99  CO2 23  --  25 28  GLUCOSE 109*  --  86 107*  BUN 14  --  14 12  CREATININE 0.60*  --  0.55* 0.77  CALCIUM 8.6*  --  8.7* 8.7*   < > = values in this interval not displayed.   Liver Function Tests: Recent Labs  Lab 09/21/19 1153 09/21/19 2259 09/25/19 0603  AST 30 29 37  ALT '22 23 24  ' ALKPHOS 56 52 55  BILITOT 6.6* 6.9* 6.0*  PROT 8.2* 8.3* 8.6*  ALBUMIN 3.6 3.7 3.9   No results for input(s): LIPASE, AMYLASE in the last 168 hours. No results for input(s): AMMONIA in the last 168 hours. CBC: Recent Labs  Lab 09/21/19 1153 09/21/19 2259 09/22/19 0541 09/25/19 0603  WBC 11.4* 14.8* 11.2* 11.6*  NEUTROABS 4.9 5.9 3.1 6.0  HGB 9.3* 8.7* 8.4* 8.7*  HCT 26.3* 24.4* 23.8* 23.3*  MCV 114.8* 110.9* 111.7* 106.9*  PLT 379 341 364 377   Cardiac Enzymes: No results for input(s): CKTOTAL, CKMB, CKMBINDEX, TROPONINI in the last 168 hours. BNP: Invalid input(s): POCBNP CBG: No results for input(s): GLUCAP in the last 168 hours.  Time coordinating discharge: 40 minutes  Signed:  Donia Pounds  APRN, [name], [name] [facility] [address] [place], [place] [address] [phone]  [facility] 09/27/2019, 3:01 PM

## 2019-10-09 ENCOUNTER — Encounter (HOSPITAL_COMMUNITY): Payer: Self-pay | Admitting: Internal Medicine

## 2019-10-09 ENCOUNTER — Encounter (HOSPITAL_COMMUNITY): Payer: Self-pay | Admitting: Emergency Medicine

## 2019-10-09 ENCOUNTER — Other Ambulatory Visit: Payer: Self-pay

## 2019-10-09 ENCOUNTER — Telehealth: Payer: Self-pay | Admitting: Family Medicine

## 2019-10-09 ENCOUNTER — Emergency Department (HOSPITAL_COMMUNITY)
Admission: EM | Admit: 2019-10-09 | Discharge: 2019-10-09 | Disposition: A | Discharge status: Home / Self Care | Payer: Medicaid Other | Attending: Emergency Medicine | Admitting: Emergency Medicine

## 2019-10-09 ENCOUNTER — Non-Acute Institutional Stay (HOSPITAL_BASED_OUTPATIENT_CLINIC_OR_DEPARTMENT_OTHER)
Admission: AD | Admit: 2019-10-09 | Discharge: 2019-10-09 | Disposition: A | Discharge status: Home / Self Care | Payer: Medicaid Other | Source: Ambulatory Visit | Attending: Internal Medicine | Admitting: Internal Medicine

## 2019-10-09 ENCOUNTER — Other Ambulatory Visit: Payer: Self-pay | Admitting: Family Medicine

## 2019-10-09 DIAGNOSIS — Z888 Allergy status to other drugs, medicaments and biological substances status: Secondary | ICD-10-CM | POA: Insufficient documentation

## 2019-10-09 DIAGNOSIS — G8929 Other chronic pain: Secondary | ICD-10-CM

## 2019-10-09 DIAGNOSIS — M549 Dorsalgia, unspecified: Secondary | ICD-10-CM | POA: Insufficient documentation

## 2019-10-09 DIAGNOSIS — D5700 Hb-SS disease with crisis, unspecified: Secondary | ICD-10-CM

## 2019-10-09 DIAGNOSIS — F1120 Opioid dependence, uncomplicated: Secondary | ICD-10-CM | POA: Insufficient documentation

## 2019-10-09 DIAGNOSIS — F1210 Cannabis abuse, uncomplicated: Secondary | ICD-10-CM | POA: Diagnosis not present

## 2019-10-09 DIAGNOSIS — D571 Sickle-cell disease without crisis: Secondary | ICD-10-CM

## 2019-10-09 DIAGNOSIS — Z7901 Long term (current) use of anticoagulants: Secondary | ICD-10-CM | POA: Diagnosis not present

## 2019-10-09 DIAGNOSIS — F419 Anxiety disorder, unspecified: Secondary | ICD-10-CM | POA: Insufficient documentation

## 2019-10-09 DIAGNOSIS — M2550 Pain in unspecified joint: Secondary | ICD-10-CM | POA: Insufficient documentation

## 2019-10-09 DIAGNOSIS — D638 Anemia in other chronic diseases classified elsewhere: Secondary | ICD-10-CM | POA: Insufficient documentation

## 2019-10-09 DIAGNOSIS — E559 Vitamin D deficiency, unspecified: Secondary | ICD-10-CM | POA: Insufficient documentation

## 2019-10-09 DIAGNOSIS — G894 Chronic pain syndrome: Secondary | ICD-10-CM | POA: Insufficient documentation

## 2019-10-09 DIAGNOSIS — Z79899 Other long term (current) drug therapy: Secondary | ICD-10-CM | POA: Insufficient documentation

## 2019-10-09 DIAGNOSIS — Z21 Asymptomatic human immunodeficiency virus [HIV] infection status: Secondary | ICD-10-CM | POA: Insufficient documentation

## 2019-10-09 LAB — CBC WITH DIFFERENTIAL/PLATELET
Abs Immature Granulocytes: 0.03 10*3/uL (ref 0.00–0.07)
Basophils Absolute: 0 10*3/uL (ref 0.0–0.1)
Basophils Relative: 0 %
Eosinophils Absolute: 0.1 10*3/uL (ref 0.0–0.5)
Eosinophils Relative: 1 %
HCT: 23.4 % — ABNORMAL LOW (ref 39.0–52.0)
Hemoglobin: 8.8 g/dL — ABNORMAL LOW (ref 13.0–17.0)
Immature Granulocytes: 0 %
Lymphocytes Relative: 38 %
Lymphs Abs: 4.8 10*3/uL — ABNORMAL HIGH (ref 0.7–4.0)
MCH: 39.6 pg — ABNORMAL HIGH (ref 26.0–34.0)
MCHC: 37.6 g/dL — ABNORMAL HIGH (ref 30.0–36.0)
MCV: 105.4 fL — ABNORMAL HIGH (ref 80.0–100.0)
Monocytes Absolute: 1.7 10*3/uL — ABNORMAL HIGH (ref 0.1–1.0)
Monocytes Relative: 13 %
Neutro Abs: 6 10*3/uL (ref 1.7–7.7)
Neutrophils Relative %: 48 %
Platelets: 358 10*3/uL (ref 150–400)
RBC: 2.22 MIL/uL — ABNORMAL LOW (ref 4.22–5.81)
RDW: 23.7 % — ABNORMAL HIGH (ref 11.5–15.5)
WBC: 12.7 10*3/uL — ABNORMAL HIGH (ref 4.0–10.5)
nRBC: 5.6 % — ABNORMAL HIGH (ref 0.0–0.2)

## 2019-10-09 LAB — RETICULOCYTES
Immature Retic Fract: 35.1 % — ABNORMAL HIGH (ref 2.3–15.9)
RBC.: 2.25 MIL/uL — ABNORMAL LOW (ref 4.22–5.81)
Retic Count, Absolute: 444.5 10*3/uL — ABNORMAL HIGH (ref 19.0–186.0)
Retic Ct Pct: 19.8 % — ABNORMAL HIGH (ref 0.4–3.1)

## 2019-10-09 LAB — BASIC METABOLIC PANEL
Anion gap: 10 (ref 5–15)
BUN: 7 mg/dL (ref 6–20)
CO2: 24 mmol/L (ref 22–32)
Calcium: 8.7 mg/dL — ABNORMAL LOW (ref 8.9–10.3)
Chloride: 108 mmol/L (ref 98–111)
Creatinine, Ser: 0.53 mg/dL — ABNORMAL LOW (ref 0.61–1.24)
GFR calc Af Amer: >60 mL/min (ref >60)
GFR calc non Af Amer: >60 mL/min (ref >60)
Glucose, Bld: 101 mg/dL — ABNORMAL HIGH (ref 70–99)
Potassium: 3.5 mmol/L (ref 3.5–5.1)
Sodium: 142 mmol/L (ref 135–145)

## 2019-10-09 MED ORDER — OXYCODONE-ACETAMINOPHEN 10-325 MG PO TABS: 1.0000 | ORAL_TABLET | ORAL | 0 refills | Status: DC | PRN

## 2019-10-09 MED ORDER — SODIUM CHLORIDE 0.9 % IV SOLN
25.0000 mg | INTRAVENOUS | Status: DC | PRN
  Filled 2019-10-09: qty 0.5

## 2019-10-09 MED ORDER — HYDROMORPHONE HCL 2 MG/ML IJ SOLN
2.0000 mg | INTRAMUSCULAR | Status: AC
  Administered 2019-10-09: 2 mg via INTRAVENOUS
  Filled 2019-10-09: qty 1

## 2019-10-09 MED ORDER — HEPARIN SOD (PORK) LOCK FLUSH 100 UNIT/ML IV SOLN
500.0000 [IU] | INTRAVENOUS | Status: DC | PRN
  Filled 2019-10-09: qty 5

## 2019-10-09 MED ORDER — ONDANSETRON HCL 4 MG/2ML IJ SOLN: 4.0000 mg | INTRAMUSCULAR | Status: DC | PRN

## 2019-10-09 MED ORDER — HYDROMORPHONE 1 MG/ML IV SOLN
INTRAVENOUS | Status: DC
  Administered 2019-10-09: 30 mg via INTRAVENOUS
  Administered 2019-10-09: 8 mg via INTRAVENOUS
  Filled 2019-10-09: qty 30

## 2019-10-09 MED ORDER — DIPHENHYDRAMINE HCL 50 MG/ML IJ SOLN
25.0000 mg | Freq: Once | INTRAMUSCULAR | Status: AC
  Administered 2019-10-09: 25 mg via INTRAVENOUS
  Filled 2019-10-09: qty 1

## 2019-10-09 MED ORDER — NALOXONE HCL 0.4 MG/ML IJ SOLN: 0.4000 mg | INTRAMUSCULAR | Status: DC | PRN

## 2019-10-09 MED ORDER — DIPHENHYDRAMINE HCL 25 MG PO CAPS
25.0000 mg | ORAL_CAPSULE | Freq: Once | ORAL | Status: AC
  Administered 2019-10-09: 25 mg via ORAL
  Filled 2019-10-09: qty 1

## 2019-10-09 MED ORDER — HYDROMORPHONE HCL 2 MG/ML IJ SOLN
2.0000 mg | Freq: Once | INTRAMUSCULAR | Status: AC
  Administered 2019-10-09: 2 mg via INTRAVENOUS
  Filled 2019-10-09: qty 1

## 2019-10-09 MED ORDER — ONDANSETRON HCL 4 MG/2ML IJ SOLN: 4.0000 mg | Freq: Four times a day (QID) | INTRAMUSCULAR | Status: DC | PRN

## 2019-10-09 MED ORDER — SODIUM CHLORIDE 0.45 % IV SOLN: INTRAVENOUS | Status: DC

## 2019-10-09 MED ORDER — SODIUM CHLORIDE 0.9% FLUSH
9.0000 mL | INTRAVENOUS | Status: DC | PRN
  Administered 2019-10-09: 9 mL via INTRAVENOUS

## 2019-10-09 MED ORDER — DIPHENHYDRAMINE HCL 25 MG PO CAPS: 25.0000 mg | ORAL_CAPSULE | ORAL | Status: DC | PRN

## 2019-10-09 NOTE — Discharge Instructions (Addendum)
You must go directly to the sickle cell clinic.

## 2019-10-09 NOTE — H&P (Signed)
[facility] History and Physical   Date: 10/09/2019  Patient name: Martin [name] Medical record number: [id number] Date of birth: 1990-07-05 Age: 29 y.o. Gender: male PCP: [name], [name]  Attending physician: No att. providers found  Chief Complaint: Sickle cell pain  History of Present Illness: Martin [name] is a 29 year old male with a medical history significant for sickle cell disease, chronic pain syndrome, opiate dependence and tolerance, HIV disease, and history of anemia of chronic disease presents complaining of low back and lower extremity pain that is consistent with his typical crisis.  Patient attributes pain crisis to falling at a party on last night.  Patient was treated and evaluated in the emergency department for this problem without complete resolution.  Agree with ER provider that patient is appropriate to transition to sickle cell day infusion center for pain management and extended observation.  Pain intensity is 8/10 characterized as constant and throbbing.  Pain is primarily to low back.  Patient states the pain intensity is worsened with ambulation.  He denies headache, chest pain, fever, chills, shortness of breath, urinary symptoms, nausea, vomiting, or diarrhea.  Patient denies any sick contacts, recent travel, or exposure to COVID-19.  Meds: No medications prior to admission.    Allergies: Tape and Ketorolac Past Medical History:  Diagnosis Date  . Anxiety   . HIV (human immunodeficiency virus infection) (HCC)   . Sickle cell crisis (HCC)   . 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HCC)   . Vitamin D deficiency 10/2018   Past Surgical History:  Procedure Laterality Date  . IR IMAGING GUIDED PORT INSERTION  08/29/2019   Family History  Problem Relation Age of Onset  . Sickle cell trait Mother   . Sickle cell trait Father   . Birth defects Maternal Grandmother   . Birth defects Paternal Grandmother    Social History   Socioeconomic  History  . Marital status: Single    Spouse name: Not on file  . Number of children: Not on file  . Years of education: Not on file  . Highest education level: Not on file  Occupational History  . Not on file  Tobacco Use  . Smoking status: Never Smoker  . Smokeless tobacco: Never Used  Vaping Use  . Vaping Use: Some days  . Substances: THC  Substance and Sexual Activity  . Alcohol use: No  . Drug use: Yes    Types: Marijuana  . Sexual activity: Yes    Birth control/protection: Condom  Other Topics Concern  . Not on file  Social History Narrative  . Not on file   Social Determinants of Health   Financial Resource Strain:   . Difficulty of Paying Living Expenses:   Food Insecurity:   . Worried About Programme researcher, broadcasting/film/video in the Last Year:   . Barista in the Last Year:   Transportation Needs:   . Freight forwarder (Medical):   Marland Kitchen Lack of Transportation (Non-Medical):   Physical Activity:   . Days of Exercise per Week:   . Minutes of Exercise per Session:   Stress:   . Feeling of Stress :   Social Connections:   . Frequency of Communication with Friends and Family:   . Frequency of Social Gatherings with Friends and Family:   . Attends Religious Services:   . Active Member of Clubs or Organizations:   . Attends Banker Meetings:   Marland Kitchen Marital Status:  Intimate Partner Violence:   . Fear of Current or Ex-Partner:   . Emotionally Abused:   Marland Kitchen Physically Abused:   . Sexually Abused:    Review of Systems  Constitutional: Negative.   HENT: Negative.   Eyes: Negative.   Cardiovascular: Negative.   Gastrointestinal: Negative.   Genitourinary: Negative.   Musculoskeletal: Positive for back pain and joint pain.  Skin: Negative.   Neurological: Negative.   Endo/Heme/Allergies: Negative.   Psychiatric/Behavioral: Negative.    Physical Exam: Blood pressure 93/61, pulse 80, temperature 98.1 F (36.7 C), temperature source Temporal, resp. rate  18, SpO2 94 %. BP 93/61 (BP Location: Left Arm)   Pulse 80   Temp 98.1 F (36.7 C) (Temporal)   Resp 18   SpO2 94%   General Appearance:    Alert, cooperative, no distress, appears stated age  Head:    Normocephalic, without obvious abnormality, atraumatic  Eyes:    PERRL, conjunctiva/corneas clear, EOM's intact, fundi    benign, both eyes       Ears:    Normal TM's and external ear canals, both ears  Throat:   Lips, mucosa, and tongue normal; teeth and gums normal  Neck:   Supple, symmetrical, trachea midline, no adenopathy;       thyroid:  No enlargement/tenderness/nodules; no carotid   bruit or JVD  Back:     Symmetric, no curvature, ROM normal, no CVA tenderness  Lungs:     Clear to auscultation bilaterally, respirations unlabored  Chest wall:    No tenderness or deformity  Heart:    Regular rate and rhythm, S1 and S2 normal, no murmur, rub   or gallop  Abdomen:     Soft, non-tender, bowel sounds active all four quadrants,    no masses, no organomegaly  Extremities:   Extremities normal, atraumatic, no cyanosis or edema  Pulses:   2+ and symmetric all extremities  Skin:   Skin color, texture, turgor normal, no rashes or lesions  Lymph nodes:   Cervical, supraclavicular, and axillary nodes normal  Neurologic:   CNII-XII intact. Normal strength, sensation and reflexes      throughout    Lab results: Results for orders placed or performed during the hospital encounter of 10/09/19 (from the past 24 hour(s))  CBC WITH DIFFERENTIAL     Status: Abnormal   Collection Time: 10/09/19  7:58 AM  Result Value Ref Range   WBC 12.7 (H) 4.0 - 10.5 K/uL   RBC 2.22 (L) 4.22 - 5.81 MIL/uL   Hemoglobin 8.8 (L) 13.0 - 17.0 g/dL   HCT 71.2 (L) 39 - 52 %   MCV 105.4 (H) 80.0 - 100.0 fL   MCH 39.6 (H) 26.0 - 34.0 pg   MCHC 37.6 (H) 30.0 - 36.0 g/dL   RDW 45.8 (H) 09.9 - 83.3 %   Platelets 358 150 - 400 K/uL   nRBC 5.6 (H) 0.0 - 0.2 %   Neutrophils Relative % 48 %   Neutro Abs 6.0 1.7 -  7.7 K/uL   Lymphocytes Relative 38 %   Lymphs Abs 4.8 (H) 0.7 - 4.0 K/uL   Monocytes Relative 13 %   Monocytes Absolute 1.7 (H) 0 - 1 K/uL   Eosinophils Relative 1 %   Eosinophils Absolute 0.1 0 - 0 K/uL   Basophils Relative 0 %   Basophils Absolute 0.0 0 - 0 K/uL   Immature Granulocytes 0 %   Abs Immature Granulocytes 0.03 0.00 - 0.07 K/uL   Polychromasia  MARKED    Sickle Cells MARKED    Target Cells PRESENT   Reticulocytes     Status: Abnormal   Collection Time: 10/09/19  7:58 AM  Result Value Ref Range   Retic Ct Pct 19.8 (H) 0.4 - 3.1 %   RBC. 2.25 (L) 4.22 - 5.81 MIL/uL   Retic Count, Absolute 444.5 (H) 19.0 - 186.0 K/uL   Immature Retic Fract 35.1 (H) 2.3 - 16.6 %  Basic metabolic panel     Status: Abnormal   Collection Time: 10/09/19  7:58 AM  Result Value Ref Range   Sodium 142 135 - 145 mmol/L   Potassium 3.5 3.5 - 5.1 mmol/L   Chloride 108 98 - 111 mmol/L   CO2 24 22 - 32 mmol/L   Glucose, Bld 101 (H) 70 - 99 mg/dL   BUN 7 6 - 20 mg/dL   Creatinine, Ser 0.53 (L) 0.61 - 1.24 mg/dL   Calcium 8.7 (L) 8.9 - 10.3 mg/dL   GFR calc non Af Amer >60 >60 mL/min   GFR calc Af Amer >60 >60 mL/min   Anion gap 10 5 - 15    Imaging results:  No results found.   Assessment & Plan: Patient admitted to sickle cell day infusion center for management of pain crisis.  Patient is opiate tolerant Initiate IV dilaudid PCA. Settings of 0.5 mg, 10 minute lockout, and 3 mg per hour IV fluids, 0.45% saline at 100 ml/hr Reviewed all laboratory values from the emergency department, consistent with patient's baseline and do not warrant repeating at this time. Pain intensity will be reevaluated in context of functioning and relationship to baseline as care progress If pain intensity remains elevated and/or sudden change in hemodynamic stability transition to inpatient services for higher level of care.     Donia Pounds  APRN, [name], [name] [name] [address] [place], [place] [address] [phone]  10/09/2019, 5:13 PM

## 2019-10-09 NOTE — Discharge Instructions (Signed)
Sickle Cell Anemia, Adult  Sickle cell anemia is a condition where your red blood cells are shaped like sickles. Red blood cells carry oxygen through the body. Sickle-shaped cells do not live as long as normal red blood cells. They also clump together and block blood from flowing through the blood vessels. This prevents the body from getting enough oxygen. Sickle cell anemia causes organ damage and pain. It also increases the risk of infection. Follow these instructions at home: Medicines  Take over-the-counter and prescription medicines only as told by your doctor.  If you were prescribed an antibiotic medicine, take it as told by your doctor. Do not stop taking the antibiotic even if you start to feel better.  If you develop a fever, do not take medicines to lower the fever right away. Tell your doctor about the fever. Managing pain, stiffness, and swelling  Try these methods to help with pain: ? Use a heating pad. ? Take a warm bath. ? Distract yourself, such as by watching TV. Eating and drinking  Drink enough fluid to keep your pee (urine) clear or pale yellow. Drink more in hot weather and during exercise.  Limit or avoid alcohol.  Eat a healthy diet. Eat plenty of fruits, vegetables, whole grains, and lean protein.  Take vitamins and supplements as told by your doctor. Traveling  When traveling, keep these with you: ? Your medical information. ? The names of your doctors. ? Your medicines.  If you need to take an airplane, talk to your doctor first. Activity  Rest often.  Avoid exercises that make your heart beat much faster, such as jogging. General instructions  Do not use products that have nicotine or tobacco, such as cigarettes and e-cigarettes. If you need help quitting, ask your doctor.  Consider wearing a medical alert bracelet.  Avoid being in high places (high altitudes), such as mountains.  Avoid very hot or cold temperatures.  Avoid places where the  temperature changes a lot.  Keep all follow-up visits as told by your doctor. This is important. Contact a doctor if:  A joint hurts.  Your feet or hands hurt or swell.  You feel tired (fatigued). Get help right away if:  You have symptoms of infection. These include: ? Fever. ? Chills. ? Being very tired. ? Irritability. ? Poor eating. ? Throwing up (vomiting).  You feel dizzy or faint.  You have new stomach pain, especially on the left side.  You have a an erection (priapism) that lasts more than 4 hours.  You have numbness in your arms or legs.  You have a hard time moving your arms or legs.  You have trouble talking.  You have pain that does not go away when you take medicine.  You are short of breath.  You are breathing fast.  You have a long-term cough.  You have pain in your chest.  You have a bad headache.  You have a stiff neck.  Your stomach looks bloated even though you did not eat much.  Your skin is pale.  You suddenly cannot see well. Summary  Sickle cell anemia is a condition where your red blood cells are shaped like sickles.  Follow your doctor's advice on ways to manage pain, food to eat, activities to do, and steps to take for safe travel.  Get medical help right away if you have any signs of infection, such as a fever. This information is not intended to replace advice given to you by   your health care provider. Make sure you discuss any questions you have with your health care provider. Document Revised: 07/29/2018 Document Reviewed: 05/12/2016 Elsevier Patient Education  2020 Elsevier Inc.  

## 2019-10-09 NOTE — Progress Notes (Signed)
Patient admitted to the day hospital from the ER for treatment of sickle cell pain crisis. Patient reported generalized pain rated 9/10. Patient placed on Dilaudid PCA and hydrated with IV fluids. At discharge patient reported  pain at 6/10. Discharge instructions given to the patient. Patient alert, oriented and ambulatory at discharge.

## 2019-10-09 NOTE — ED Triage Notes (Signed)
Patient complaining of sickle cell crisis. Patient is having pain all over. It started yesterday.

## 2019-10-09 NOTE — ED Notes (Signed)
Patient discharged to sickle cell clinic.

## 2019-10-09 NOTE — Discharge Summary (Signed)
[facility] Discharge Summary   Patient ID: Martin [name] MRN: [id number] DOB/AGE: 12/15/1990 29 y.o.  Admit date: 10/09/2019 Discharge date: 10/09/2019  Primary Care Physician:  Azzie Glatter, [name]  Admission Diagnoses:  Active Problems:   Sickle cell pain crisis [facility])   Discharge Medications:  Allergies as of 10/09/2019      Reactions   Tape Rash, Other (See Comments)   PLEASE DO NOT USE THE CLEAR, THICK, "PLASTIC" TAPE- only paper tape is tolerated    Ketorolac Swelling, Other (See Comments)   Patient reports facial edema and left arm edema after administration.      Medication List    TAKE these medications   ALPRAZolam 1 MG tablet Commonly known as: XANAX Take 1 tablet by mouth 3 times daily as needed for anxiety or sleep.   apixaban 5 MG Tabs tablet Commonly known as: Eliquis Take 1 tablet (5 mg total) by mouth 2 (two) times daily.   cetirizine 10 MG tablet Commonly known as: ZYRTEC Take 1 tablet (10 mg total) by mouth daily.   cholecalciferol 25 MCG (1000 UNIT) tablet Commonly known as: VITAMIN D3 Take 1,000 Units by mouth at bedtime.   diphenhydrAMINE 25 MG tablet Commonly known as: BENADRYL Take 25 mg by mouth every 6 (six) hours as needed for allergies.   hydroxyurea 500 MG capsule Commonly known as: HYDREA Take 4 capsules (2,000 mg total) by mouth at bedtime.   multivitamin with minerals Tabs tablet Take 1 tablet by mouth daily with breakfast.   naloxone 4 MG/0.1ML Liqd nasal spray kit Commonly known as: NARCAN Place 1 spray into the nose once as needed (opiod overdose).   naloxone 0.4 MG/ML injection Commonly known as: NARCAN As needed.   oxyCODONE-acetaminophen 10-325 MG tablet Commonly known as: PERCOCET Take 1 tablet by mouth every 4 (four) hours as needed for pain.   promethazine 12.5 MG tablet Commonly known as: PHENERGAN Take 12.5 mg by mouth 3 (three) times daily as needed for nausea/vomiting.   Triumeq  600-50-300 MG tablet Generic drug: abacavir-dolutegravir-lamiVUDine Take 1 tablet by mouth at bedtime.   Vitamin D (Ergocalciferol) 1.25 MG (50000 UNIT) Caps capsule Commonly known as: DRISDOL Take 1 capsule (50,000 Units total) by mouth every 7 (seven) days. What changed: when to take this        Consults:  None  Significant Diagnostic Studies:  No results found.  History of present illness:  Martin [name] is a 29 year old male with a medical history significant for sickle cell disease, chronic pain syndrome, opiate dependence and tolerance, HIV disease, and history of anemia of chronic disease presents complaining of low back and lower extremity pain that is consistent with his typical crisis.  Patient attributes pain crisis to falling at a party on last night.  Patient was treated and evaluated in the emergency department for this problem without complete resolution.  Agree with ER provider that patient is appropriate to transition to sickle cell day infusion center for pain management and extended observation.  Pain intensity is 8/10 characterized as constant and throbbing.  Pain is primarily to low back.  Patient states the pain intensity is worsened with ambulation.  He denies headache, chest pain, fever, chills, shortness of breath, urinary symptoms, nausea, vomiting, or diarrhea.  Patient denies any sick contacts, recent travel, or exposure to COVID-19.   [facility] Course: Patient admitted to sickle cell day infusion center for management of pain crisis. Reviewed all laboratory values,  appear to be consistent with patient's baseline. Pain managed with IV Dilaudid PCA with settings of 0.5 mg, 10-minute lockout, and 3 mg/h.  IV fluids, 0.45% saline at 100 mL/h Pain intensity decreased to 4/10.  Patient does not warrant admission on today.  He is alert, oriented, and ambulating without assistance.  Patient will discharge home in a hemodynamically stable  condition.  Discharge instructions: Resume all home medications.   Follow up with PCP as previously  scheduled.   Discussed the importance of drinking 64 ounces of water daily, dehydration of red blood cells may lead further sickling.   Avoid all stressors that precipitate sickle cell pain crisis.     The patient was given clear instructions to go to ER or return to medical center if symptoms do not improve, worsen or new problems develop.    Physical Exam at Discharge:  BP 93/61 (BP Location: Left Arm)   Pulse 80   Temp 98.1 F (36.7 C) (Temporal)   Resp 12   SpO2 94%  Physical Exam Constitutional:      Appearance: Normal appearance.  Eyes:     Pupils: Pupils are equal, round, and reactive to light.  Cardiovascular:     Rate and Rhythm: Normal rate and regular rhythm.  Pulmonary:     Effort: Pulmonary effort is normal.     Breath sounds: Normal breath sounds.  Abdominal:     General: Bowel sounds are normal.  Musculoskeletal:        General: Normal range of motion.  Neurological:     General: No focal deficit present.     Mental Status: He is alert.  Psychiatric:        Mood and Affect: Mood normal.        Thought Content: Thought content normal.        Judgment: Judgment normal.      Disposition at Discharge: Discharge disposition: 01-Home or Self Care       Discharge Orders: Discharge Instructions    Discharge patient   Complete by: As directed    Discharge disposition: 01-Home or Self Care   Discharge patient date: 10/09/2019      Condition at Discharge:   Stable  Time spent on Discharge:  Greater than 30 minutes.  Signed: Donia Pounds  APRN, [name], [name] [facility] [address] [place], [place] [address] [phone]  10/09/2019, 3:40 PM

## 2019-10-09 NOTE — ED Provider Notes (Signed)
[facility]-EMERGENCY DEPT Provider Note   CSN: [id number] Arrival date & time: 10/09/19  0240     History Chief Complaint  Patient presents with  . Sickle Cell Pain Crisis    Martin [name] is a 29 y.o. male.  Presents to ER with concern for sickle cell pain.  Patient reports that he has got pain throughout his joints, lower back.  Similar to past pain episodes.  States that pain is severe, had improved some with his oral pain medicines but not completely.  No fever, no chest pain.  HPI     Past Medical History:  Diagnosis Date  . Anxiety   . HIV (human immunodeficiency virus infection) (HCC)   . Sickle cell crisis (HCC)   . 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HCC)   . Vitamin D deficiency 10/2018    Patient Active Problem List   Diagnosis Date Noted  . Scleral icterus   . Chronic, continuous use of opioids 08/15/2019  . Seasonal allergies 08/15/2019  . Hypoxia   . Chest congestion   . Sickle cell anemia with crisis (HCC) 08/04/2019  . Other chronic pain 08/04/2019  . History of pulmonary embolus (PE) 08/04/2019  . Acute pulmonary embolism (HCC) 02/10/2019  . Acute chest syndrome due to sickle cell crisis (HCC) 02/10/2019  . Sickle cell crisis (HCC) 01/17/2019  . Heart murmur 02/03/2017  . Sickle cell disease with crisis (HCC) 02/02/2017  . 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 Sickle cell pain crisis (HCC) 02/02/2017  . 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HCC) 12/20/2016  . Vitamin D deficiency 08/14/2016  . High risk medication use 09/27/2014  . Generalized anxiety disorder 05/19/2014  . GERD (gastroesophageal reflux disease) 05/19/2014  . HIV (human immunodeficiency virus infection) (HCC) 05/23/2012    Past Surgical History:  Procedure Laterality Date  . IR IMAGING GUIDED PORT INSERTION  08/29/2019       Family History  Problem Relation Age of Onset  . Sickle cell trait Mother   . Sickle cell trait Father   . Birth defects Maternal Grandmother   .  Birth defects Paternal Grandmother     Social History   Tobacco Use  . Smoking status: Never Smoker  . Smokeless tobacco: Never Used  Vaping Use  . Vaping Use: Some days  . Substances: THC  Substance Use Topics  . Alcohol use: No  . Drug use: Yes    Types: Marijuana    Home Medications Prior to Admission medications   Medication Sig Start Date End Date Taking? Authorizing Provider  abacavir-dolutegravir-lamiVUDine (TRIUMEQ) 600-50-300 MG tablet Take 1 tablet by mouth at bedtime.    Yes [provider]  ALPRAZolam Prudy Feeler) 1 MG tablet Take 1 tablet by mouth 3 times daily as needed for anxiety or sleep. 09/25/19 10/25/19 Yes [name], [name]  apixaban (ELIQUIS) 5 MG TABS tablet Take 1 tablet (5 mg total) by mouth 2 (two) times daily. 08/21/19  Yes Massie Maroon, [name]  cetirizine (ZYRTEC) 10 MG tablet Take 1 tablet (10 mg total) by mouth daily. 08/04/19  Yes [name], [name]  cholecalciferol (VITAMIN D3) 25 MCG (1000 UNIT) tablet Take 1,000 Units by mouth at bedtime.   Yes [provider]  diphenhydrAMINE (BENADRYL) 25 MG tablet Take 25 mg by mouth every 6 (six) hours as needed for allergies.   Yes [provider]  hydroxyurea (HYDREA) 500 MG capsule Take 4 capsules (2,000 mg total) by mouth at bedtime. 05/26/19  Yes [name], [name]  Multiple Vitamin (MULTIVITAMIN WITH MINERALS) TABS tablet Take 1 tablet by mouth daily with breakfast.    Yes [provider]  naloxone (NARCAN) nasal spray 4 mg/0.1 mL Place 1 spray into the nose once as needed (opiod overdose).   Yes [provider]  oxyCODONE-acetaminophen (PERCOCET) 10-325 MG tablet Take 1 tablet by mouth every 4 (four) hours as needed for pain. 09/26/19  Yes [name], [name]  promethazine (PHENERGAN) 12.5 MG tablet Take 12.5 mg by mouth 3 (three) times daily as needed for nausea/vomiting. 08/19/19  Yes [provider]  Vitamin D, Ergocalciferol, (DRISDOL) 1.25 MG (50000  UT) CAPS capsule Take 1 capsule (50,000 Units total) by mouth every 7 (seven) days. Patient taking differently: Take 50,000 Units by mouth every Tuesday.  11/01/18  Yes [name], [name]  naloxone [facility]) 0.4 MG/ML injection As needed. Patient not taking: Reported on 08/15/2019 11/28/18   [name], [name]    Allergies    Tape and Ketorolac  Review of Systems   Review of Systems  Constitutional: Negative for chills and fever.  HENT: Negative for ear pain and sore throat.   Eyes: Negative for pain and visual disturbance.  Respiratory: Negative for cough and shortness of breath.   Cardiovascular: Negative for chest pain and palpitations.  Gastrointestinal: Negative for abdominal pain and vomiting.  Genitourinary: Negative for dysuria and hematuria.  Musculoskeletal: Positive for arthralgias and back pain.  Skin: Negative for color change and rash.  Neurological: Negative for seizures and syncope.  All other systems reviewed and are negative.   Physical Exam Updated Vital Signs BP 97/69   Pulse 80   Temp 98 F (36.7 C) (Oral)   Resp 15   Ht 6\' 3"  (1.905 m)   Wt 63.5 kg   SpO2 95%   BMI 17.50 kg/m   Physical Exam Vitals and nursing note reviewed.  Constitutional:      Appearance: He is well-developed.  HENT:     Head: Normocephalic and atraumatic.  Eyes:     Conjunctiva/sclera: Conjunctivae normal.  Cardiovascular:     Rate and Rhythm: Normal rate and regular rhythm.     Heart sounds: No murmur heard.   Pulmonary:     Effort: Pulmonary effort is normal. No respiratory distress.     Breath sounds: Normal breath sounds.  Abdominal:     Palpations: Abdomen is soft.     Tenderness: There is no abdominal tenderness.  Musculoskeletal:        General: No deformity or signs of injury.     Cervical back: Neck supple.  Skin:    General: Skin is warm and dry.  Neurological:     General: No focal deficit present.     Mental Status: He is alert.  Psychiatric:          Mood and Affect: Mood normal.        Behavior: Behavior normal.     ED Results / Procedures / Treatments   Labs (all labs ordered are listed, but only abnormal results are displayed) Labs Reviewed  CBC WITH DIFFERENTIAL/PLATELET - Abnormal; Notable for the following components:      Result Value   WBC 12.7 (*)    RBC 2.22 (*)    Hemoglobin 8.8 (*)    HCT 23.4 (*)    MCV 105.4 (*)    MCH 39.6 (*)    MCHC 37.6 (*)    RDW 23.7 (*)    nRBC 5.6 (*)  Lymphs Abs 4.8 (*)    Monocytes Absolute 1.7 (*)    All other components within normal limits  RETICULOCYTES - Abnormal; Notable for the following components:   Retic Ct Pct 19.8 (*)    RBC. 2.25 (*)    Retic Count, Absolute 444.5 (*)    Immature Retic Fract 35.1 (*)    All other components within normal limits  BASIC METABOLIC PANEL - Abnormal; Notable for the following components:   Glucose, Bld 101 (*)    Creatinine, Ser 0.53 (*)    Calcium 8.7 (*)    All other components within normal limits    EKG None  Radiology No results found.  Procedures Procedures (including critical care time)  Medications Ordered in ED Medications  diphenhydrAMINE (BENADRYL) injection 25 mg (25 mg Intravenous Given 10/09/19 0758)  HYDROmorphone (DILAUDID) injection 2 mg (2 mg Intravenous Given 10/09/19 0757)  HYDROmorphone (DILAUDID) injection 2 mg (2 mg Intravenous Given 10/09/19 0837)  HYDROmorphone (DILAUDID) injection 2 mg (2 mg Intravenous Given 10/09/19 1021)  diphenhydrAMINE (BENADRYL) capsule 25 mg (25 mg Oral Given 10/09/19 1021)    ED Course  I have reviewed the triage vital signs and the nursing notes.  Pertinent labs & imaging results that were available during my care of the patient were reviewed by me and considered in my medical decision making (see chart for details).  Clinical Course as of Oct 08 1532  Mon Oct 09, 2019  0946 Rechecked, still having pain, will repeat additional dose of dilaudid   [RD]  31 D/w [place]  hollins - she will accept to sickle cell day clinic   [RD]    Clinical Course User Index [RD] Lucrezia Starch, MD   MDM Rules/Calculators/A&P                          29 year old male sickle cell disease presenting to ER with pain crisis.  On exam patient is well-appearing, afebrile.  Lab work grossly stable, hemoglobin at baseline.  Patient still having ongoing pain despite multiple doses of Dilaudid.  Reviewed with NP Smith Robert with sickle cell clinic, she assume care and likely admit to their day clinic.  Patient discharged from ER and transported directly to sickle cell clinic from ER.   Final Clinical Impression(s) / ED Diagnoses Final diagnoses:  Sickle cell pain crisis [facility])    Rx / DC Orders ED Discharge Orders    None       Lucrezia Starch, MD 10/10/19 0830

## 2019-10-10 ENCOUNTER — Non-Acute Institutional Stay (HOSPITAL_COMMUNITY)
Admission: AD | Admit: 2019-10-10 | Discharge: 2019-10-10 | Disposition: A | Discharge status: Home / Self Care | Payer: Medicaid Other | Source: Ambulatory Visit | Attending: Internal Medicine | Admitting: Internal Medicine

## 2019-10-10 ENCOUNTER — Telehealth: Payer: Self-pay | Admitting: Family Medicine

## 2019-10-10 ENCOUNTER — Encounter (HOSPITAL_COMMUNITY): Payer: Self-pay | Admitting: Family Medicine

## 2019-10-10 DIAGNOSIS — Z7901 Long term (current) use of anticoagulants: Secondary | ICD-10-CM | POA: Insufficient documentation

## 2019-10-10 DIAGNOSIS — Z886 Allergy status to analgesic agent status: Secondary | ICD-10-CM | POA: Insufficient documentation

## 2019-10-10 DIAGNOSIS — Z21 Asymptomatic human immunodeficiency virus [HIV] infection status: Secondary | ICD-10-CM | POA: Diagnosis not present

## 2019-10-10 DIAGNOSIS — G894 Chronic pain syndrome: Secondary | ICD-10-CM | POA: Diagnosis not present

## 2019-10-10 DIAGNOSIS — D638 Anemia in other chronic diseases classified elsewhere: Secondary | ICD-10-CM | POA: Diagnosis not present

## 2019-10-10 DIAGNOSIS — Z79899 Other long term (current) drug therapy: Secondary | ICD-10-CM | POA: Diagnosis not present

## 2019-10-10 DIAGNOSIS — Z888 Allergy status to other drugs, medicaments and biological substances status: Secondary | ICD-10-CM | POA: Insufficient documentation

## 2019-10-10 DIAGNOSIS — F419 Anxiety disorder, unspecified: Secondary | ICD-10-CM | POA: Insufficient documentation

## 2019-10-10 DIAGNOSIS — D5700 Hb-SS disease with crisis, unspecified: Secondary | ICD-10-CM | POA: Diagnosis not present

## 2019-10-10 DIAGNOSIS — Z79891 Long term (current) use of opiate analgesic: Secondary | ICD-10-CM | POA: Insufficient documentation

## 2019-10-10 MED ORDER — SODIUM CHLORIDE 0.9% FLUSH
9.0000 mL | INTRAVENOUS | Status: DC | PRN
  Administered 2019-10-10: 9 mL via INTRAVENOUS

## 2019-10-10 MED ORDER — HYDROMORPHONE 1 MG/ML IV SOLN
INTRAVENOUS | Status: DC
  Administered 2019-10-10: 13.5 mg via INTRAVENOUS
  Administered 2019-10-10: 30 mg via INTRAVENOUS
  Filled 2019-10-10: qty 30

## 2019-10-10 MED ORDER — SODIUM CHLORIDE 0.45 % IV SOLN: INTRAVENOUS | Status: DC

## 2019-10-10 MED ORDER — HEPARIN SOD (PORK) LOCK FLUSH 100 UNIT/ML IV SOLN
500.0000 [IU] | INTRAVENOUS | Status: DC | PRN
  Administered 2019-10-10: 500 [IU]
  Filled 2019-10-10: qty 5

## 2019-10-10 MED ORDER — NALOXONE HCL 0.4 MG/ML IJ SOLN: 0.4000 mg | INTRAMUSCULAR | Status: DC | PRN

## 2019-10-10 MED ORDER — ONDANSETRON HCL 4 MG/2ML IJ SOLN: 4.0000 mg | Freq: Four times a day (QID) | INTRAMUSCULAR | Status: DC | PRN

## 2019-10-10 MED ORDER — SODIUM CHLORIDE 0.9 % IV SOLN
25.0000 mg | INTRAVENOUS | Status: DC | PRN
  Filled 2019-10-10: qty 0.5

## 2019-10-10 MED ORDER — DIPHENHYDRAMINE HCL 25 MG PO CAPS: 25.0000 mg | ORAL_CAPSULE | ORAL | Status: DC | PRN

## 2019-10-10 NOTE — Progress Notes (Signed)
[facility]. Patient was seen in day hospital yesterday for sickle cell pain crisis. Today patient reports generalized pain rated 10/10. Reports being out of prescribed pain medications but say that medication is due today. COVID-19 screening done and patient denies fever, chest pain, nausea, vomiting, diarrhea, abdominal pain and priapism. [place], [name] notified. Patient can come to the day hospital for pain management. Patient advised.

## 2019-10-10 NOTE — H&P (Signed)
[facility] History and Physical   Date: 10/10/2019  Patient name: Martin [name] Medical record number: [id number] Date of birth: [birth year] Age: 29 y.o. Gender: male PCP: [name], [name]  Attending physician: Quentin Angst, MD  Chief Complaint: Sickle cell pain  History of Present Illness: Martin [name] is a 29 year old male with a medical history significant for sickle cell disease, chronic pain syndrome, opiate dependence and tolerance, HIV disease, and anemia of chronic disease presents complaining of low back pain and lower extremity pain that is consistent with his typical pain crisis.  Patient was treated and evaluated in sickle cell day infusion center on yesterday.  He states that pain was managed at discharge, but shortly returned later that evening.  Pain intensity is 9/10 characterized as constant and throbbing.  He last had Percocet this a.m. without sustained relief.  He denies headache, chest pain, fever, chills, shortness of breath, urinary symptoms, nausea, vomiting, or diarrhea.  He also denies any sick contacts, recent travel, or exposure to COVID-19.  Meds: Medications Prior to Admission  Medication Sig Dispense Refill Last Dose  . abacavir-dolutegravir-lamiVUDine (TRIUMEQ) 600-50-300 MG tablet Take 1 tablet by mouth at bedtime.      . ALPRAZolam (XANAX) 1 MG tablet Take 1 tablet by mouth 3 times daily as needed for anxiety or sleep. 30 tablet 0   . apixaban (ELIQUIS) 5 MG TABS tablet Take 1 tablet (5 mg total) by mouth 2 (two) times daily. 60 tablet 5   . cetirizine (ZYRTEC) 10 MG tablet Take 1 tablet (10 mg total) by mouth daily. 30 tablet 11   . cholecalciferol (VITAMIN D3) 25 MCG (1000 UNIT) tablet Take 1,000 Units by mouth at bedtime.     . diphenhydrAMINE (BENADRYL) 25 MG tablet Take 25 mg by mouth every 6 (six) hours as needed for allergies.     . hydroxyurea (HYDREA) 500 MG capsule Take 4 capsules (2,000 mg total) by mouth at  bedtime. 120 capsule 3   . Multiple Vitamin (MULTIVITAMIN WITH MINERALS) TABS tablet Take 1 tablet by mouth daily with breakfast.      . naloxone (NARCAN) 0.4 MG/ML injection As needed. (Patient not taking: Reported on 08/15/2019) 1 mL 2   . naloxone (NARCAN) nasal spray 4 mg/0.1 mL Place 1 spray into the nose once as needed (opiod overdose).     [name] ON 10/11/2019] oxyCODONE-acetaminophen (PERCOCET) 10-325 MG tablet Take 1 tablet by mouth every 4 (four) hours as needed for pain. 90 tablet 0   . promethazine (PHENERGAN) 12.5 MG tablet Take 12.5 mg by mouth 3 (three) times daily as needed for nausea/vomiting.     . Vitamin D, Ergocalciferol, (DRISDOL) 1.25 MG (50000 UT) CAPS capsule Take 1 capsule (50,000 Units total) by mouth every 7 (seven) days. (Patient taking differently: Take 50,000 Units by mouth every Tuesday. ) 5 capsule 3     Allergies: Tape and Ketorolac Past Medical History:  Diagnosis Date  . Anxiety   . HIV (human immunodeficiency virus infection) (HCC)   . Sickle cell crisis (HCC)   . 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HCC)   . Vitamin D deficiency 10/2018   Past Surgical History:  Procedure Laterality Date  . IR IMAGING GUIDED PORT INSERTION  08/29/2019   Family History  Problem Relation Age of Onset  . Sickle cell trait Mother   . Sickle cell trait Father   . Birth defects Maternal Grandmother   . Birth defects Paternal Grandmother  Social History   Socioeconomic History  . Marital status: Single    Spouse name: Not on file  . Number of children: Not on file  . Years of education: Not on file  . Highest education level: Not on file  Occupational History  . Not on file  Tobacco Use  . Smoking status: Never Smoker  . Smokeless tobacco: Never Used  Vaping Use  . Vaping Use: Some days  . Substances: THC  Substance and Sexual Activity  . Alcohol use: No  . Drug use: Yes    Types: Marijuana  . Sexual activity: Yes    Birth control/protection: Condom  Other Topics  Concern  . Not on file  Social History Narrative  . Not on file   Social Determinants of Health   Financial Resource Strain:   . Difficulty of Paying Living Expenses:   Food Insecurity:   . Worried About Programme researcher, broadcasting/film/video in the Last Year:   . Barista in the Last Year:   Transportation Needs:   . Freight forwarder (Medical):   Marland Kitchen Lack of Transportation (Non-Medical):   Physical Activity:   . Days of Exercise per Week:   . Minutes of Exercise per Session:   Stress:   . Feeling of Stress :   Social Connections:   . Frequency of Communication with Friends and Family:   . Frequency of Social Gatherings with Friends and Family:   . Attends Religious Services:   . Active Member of Clubs or Organizations:   . Attends Banker Meetings:   Marland Kitchen Marital Status:   Intimate Partner Violence:   . Fear of Current or Ex-Partner:   . Emotionally Abused:   Marland Kitchen Physically Abused:   . Sexually Abused:    Review of Systems  Constitutional: Negative for chills and fever.  HENT: Negative.   Eyes: Negative.   Respiratory: Negative.   Cardiovascular: Negative.   Gastrointestinal: Negative.   Genitourinary: Negative.   Musculoskeletal: Positive for back pain and joint pain.  Skin: Negative.   Neurological: Negative.   Psychiatric/Behavioral: Negative.    Physical Exam: Blood pressure (!) 105/58, pulse 80, temperature 98.6 F (37 C), temperature source Temporal, resp. rate 16, SpO2 94 %. Physical Exam Constitutional:      Appearance: Normal appearance.  Eyes:     Pupils: Pupils are equal, round, and reactive to light.  Cardiovascular:     Rate and Rhythm: Normal rate and regular rhythm.     Pulses: Normal pulses.  Pulmonary:     Effort: Pulmonary effort is normal.  Abdominal:     General: Abdomen is flat. Bowel sounds are normal.  Musculoskeletal:        General: Normal range of motion.  Skin:    General: Skin is warm.  Neurological:     General: No focal  deficit present.     Mental Status: He is alert.  Psychiatric:        Mood and Affect: Mood normal.        Behavior: Behavior normal.        Thought Content: Thought content normal.        Judgment: Judgment normal.      Lab results: No results found for this or any previous visit (from the past 24 hour(s)).  Imaging results:  No results found.   Assessment & Plan: Patient admitted to sickle cell day infusion center for management of pain crisis.  Patient is opiate tolerant  Initiate IV dilaudid PCA. Settings of 0.5 mg, 10 minute lockout, and 3 mg/hr IV fluids, 0.45% saline at 100 ml/hr Review CBC with differential, complete metabolic panel, and reticulocytes as results become available. Pain intensity will be reevaluated in context of functioning and relationship to baseline as care progress If pain intensity remains elevated and/or sudden change in hemodynamic stability transition to inpatient services for higher level of care.    Donia Pounds  APRN, [name], [name] [facility] [address] [place], [place] [address] [phone]   10/10/2019, 10:27 AM

## 2019-10-10 NOTE — Progress Notes (Signed)
Patient admitted to the day infusion hospital for sickle cell pain crisis. Initially, patient reported generalized pain rated 10/10. For pain management, patient placed on Dilaudid PCA and hydrated with IV fluids. At discharge, patient rated pain at 4/10. Vital signs stable. Discharge instructions given. Patient alert, oriented and ambulatory at discharge.

## 2019-10-10 NOTE — Telephone Encounter (Signed)
Angie from [facility] care called wanting the chart notes from June 9th. In addition, she wanted a note on a prescription pad stating the pt still uses oxygen? She wants it to have the diagnosis and the quantity of oxygen listed at 3L of O2.You can call [facility] at [phone] and ask for Angie for more info.

## 2019-10-11 ENCOUNTER — Encounter: Payer: Self-pay | Admitting: Family Medicine

## 2019-10-11 MED FILL — OXYCODONE-APAP 10-325: 10-325 | 15 days supply | Qty: 90 | Fill #0

## 2019-10-11 NOTE — Telephone Encounter (Signed)
Documents have been faxed.

## 2019-10-11 NOTE — Discharge Summary (Signed)
[facility] Discharge Summary   Patient ID: [name] MRN: [id number] DOB/AGE: 1990-07-26 29 y.o.  Admit date: 10/10/2019 Discharge date: 10/11/2019  Primary Care Physician:  Azzie Glatter, [name]  Admission Diagnoses:  Active Problems:   Sickle cell pain crisis [facility])   Discharge Medications:  Allergies as of 10/10/2019      Reactions   Tape Rash, Other (See Comments)   PLEASE DO NOT USE THE CLEAR, THICK, "PLASTIC" TAPE- only paper tape is tolerated    Ketorolac Swelling, Other (See Comments)   Patient reports facial edema and left arm edema after administration.      Medication List    TAKE these medications   ALPRAZolam 1 MG tablet Commonly known as: XANAX Take 1 tablet by mouth 3 times daily as needed for anxiety or sleep.   apixaban 5 MG Tabs tablet Commonly known as: Eliquis Take 1 tablet (5 mg total) by mouth 2 (two) times daily.   cetirizine 10 MG tablet Commonly known as: ZYRTEC Take 1 tablet (10 mg total) by mouth daily.   cholecalciferol 25 MCG (1000 UNIT) tablet Commonly known as: VITAMIN D3 Take 1,000 Units by mouth at bedtime.   diphenhydrAMINE 25 MG tablet Commonly known as: BENADRYL Take 25 mg by mouth every 6 (six) hours as needed for allergies.   hydroxyurea 500 MG capsule Commonly known as: HYDREA Take 4 capsules (2,000 mg total) by mouth at bedtime.   multivitamin with minerals Tabs tablet Take 1 tablet by mouth daily with breakfast.   naloxone 4 MG/0.1ML Liqd nasal spray kit Commonly known as: NARCAN Place 1 spray into the nose once as needed (opiod overdose).   naloxone 0.4 MG/ML injection Commonly known as: NARCAN As needed.   oxyCODONE-acetaminophen 10-325 MG tablet Commonly known as: PERCOCET Take 1 tablet by mouth every 4 (four) hours as needed for pain.   promethazine 12.5 MG tablet Commonly known as: PHENERGAN Take 12.5 mg by mouth 3 (three) times daily as needed for nausea/vomiting.   Triumeq  600-50-300 MG tablet Generic drug: abacavir-dolutegravir-lamiVUDine Take 1 tablet by mouth at bedtime.   Vitamin D (Ergocalciferol) 1.25 MG (50000 UNIT) Caps capsule Commonly known as: DRISDOL Take 1 capsule (50,000 Units total) by mouth every 7 (seven) days. What changed: when to take this        Consults:  None  Significant Diagnostic Studies:  No results found.  History of present illness:  Martin [name] is a 29 year old male with a medical history significant for sickle cell disease, chronic pain syndrome, opiate dependence and tolerance, HIV disease, and anemia of chronic disease presents complaining of low back pain and lower extremity pain that is consistent with his typical pain crisis.  Patient was treated and evaluated in sickle cell day infusion center on yesterday.  He states that pain was managed at discharge, but shortly returned later that evening.  Pain intensity is 9/10 characterized as constant and throbbing.  He last had Percocet this a.m. without sustained relief.  He denies headache, chest pain, fever, chills, shortness of breath, urinary symptoms, nausea, vomiting, or diarrhea.  He also denies any sick contacts, recent travel, or exposure to COVID-19. [facility] Course: Patient admitted to sickle cell day infusion center for management of pain crisis. Reviewed laboratory values from yesterday, consistent with patient's baseline and did not warrant repeating on today. Pain managed with IV Dilaudid via PCA with settings of 0.5 mg, 10-minute lockout, 3 mg/h Tylenol 1000 mg x 1  IV fluids, 0.45% saline at 100 mL/h Pain intensity decreased to 4/10.  Patient does not warrant admission.  He is alert, oriented, and ambulating without assistance.  Patient will discharge home in a hemodynamically stable condition.  Discharge instructions: Resume all home medications.   Follow up with PCP as previously  scheduled.   Discussed the importance of drinking  64 ounces of water daily, dehydration of red blood cells may lead further sickling.   Avoid all stressors that precipitate sickle cell pain crisis.     The patient was given clear instructions to go to ER or return to medical center if symptoms do not improve, worsen or new problems develop.   Physical Exam at Discharge:  BP 98/63 (BP Location: Right Arm)   Pulse 87   Temp 98.6 F (37 C) (Temporal)   Resp 11   SpO2 96%   Physical Exam Constitutional:      Appearance: Normal appearance.  Eyes:     Pupils: Pupils are equal, round, and reactive to light.  Cardiovascular:     Rate and Rhythm: Normal rate and regular rhythm.     Pulses: Normal pulses.  Pulmonary:     Effort: Pulmonary effort is normal.  Abdominal:     General: Abdomen is flat. Bowel sounds are normal.  Musculoskeletal:        General: Normal range of motion.  Skin:    General: Skin is warm.  Neurological:     General: No focal deficit present.     Mental Status: He is alert. Mental status is at baseline.  Psychiatric:        Mood and Affect: Mood normal.        Thought Content: Thought content normal.        Judgment: Judgment normal.     Disposition at Discharge: Discharge disposition: 01-Home or Self Care       Discharge Orders: Discharge Instructions    Discharge patient   Complete by: As directed    Discharge disposition: 01-Home or Self Care   Discharge patient date: 10/10/2019      Condition at Discharge:   Stable  Time spent on Discharge:  Greater than 30 minutes.  Signed: Donia Pounds  APRN, [name], [name] [facility] [address] [place], [place] [address] [phone]  10/11/2019, 4:56 PM

## 2019-10-11 NOTE — Telephone Encounter (Signed)
Done

## 2019-10-16 ENCOUNTER — Ambulatory Visit: Payer: Medicaid Other | Admitting: Family Medicine

## 2019-10-24 ENCOUNTER — Telehealth: Payer: Self-pay | Admitting: Family Medicine

## 2019-10-24 ENCOUNTER — Other Ambulatory Visit: Payer: Self-pay | Admitting: Family Medicine

## 2019-10-24 DIAGNOSIS — G8929 Other chronic pain: Secondary | ICD-10-CM

## 2019-10-24 DIAGNOSIS — D571 Sickle-cell disease without crisis: Secondary | ICD-10-CM

## 2019-10-24 DIAGNOSIS — F419 Anxiety disorder, unspecified: Secondary | ICD-10-CM

## 2019-10-24 MED ORDER — ALPRAZOLAM 1 MG PO TABS: ORAL_TABLET | ORAL | 0 refills | Status: DC

## 2019-10-24 MED ORDER — OXYCODONE-ACETAMINOPHEN 10-325 MG PO TABS: 1.0000 | ORAL_TABLET | ORAL | 0 refills | Status: DC | PRN

## 2019-10-24 NOTE — Telephone Encounter (Signed)
Pt called in a refill on percocet and xanax

## 2019-11-06 ENCOUNTER — Telehealth: Payer: Self-pay | Admitting: Family Medicine

## 2019-11-07 ENCOUNTER — Other Ambulatory Visit: Payer: Self-pay | Admitting: Family Medicine

## 2019-11-07 DIAGNOSIS — D571 Sickle-cell disease without crisis: Secondary | ICD-10-CM

## 2019-11-07 DIAGNOSIS — G8929 Other chronic pain: Secondary | ICD-10-CM

## 2019-11-07 MED ORDER — OXYCODONE-ACETAMINOPHEN 10-325 MG PO TABS: 1.0000 | ORAL_TABLET | ORAL | 0 refills | Status: DC | PRN

## 2019-11-07 NOTE — Telephone Encounter (Signed)
Done

## 2019-11-16 DIAGNOSIS — J9621 Acute and chronic respiratory failure with hypoxia: Secondary | ICD-10-CM

## 2019-11-16 DIAGNOSIS — J9620 Acute and chronic respiratory failure, unspecified whether with hypoxia or hypercapnia: Secondary | ICD-10-CM

## 2019-11-16 DIAGNOSIS — J9610 Chronic respiratory failure, unspecified whether with hypoxia or hypercapnia: Secondary | ICD-10-CM

## 2019-11-20 ENCOUNTER — Other Ambulatory Visit: Payer: Self-pay

## 2019-11-20 ENCOUNTER — Emergency Department (HOSPITAL_COMMUNITY)
Admission: EM | Admit: 2019-11-20 | Discharge: 2019-11-20 | Disposition: A | Discharge status: Home / Self Care | Payer: Medicaid Other | Source: Home / Self Care | Attending: Emergency Medicine | Admitting: Emergency Medicine

## 2019-11-20 DIAGNOSIS — D5700 Hb-SS disease with crisis, unspecified: Secondary | ICD-10-CM

## 2019-11-20 DIAGNOSIS — Z86711 Personal history of pulmonary embolism: Secondary | ICD-10-CM | POA: Insufficient documentation

## 2019-11-20 DIAGNOSIS — D57219 Sickle-cell/Hb-C disease with crisis, unspecified: Secondary | ICD-10-CM | POA: Insufficient documentation

## 2019-11-20 DIAGNOSIS — Z7901 Long term (current) use of anticoagulants: Secondary | ICD-10-CM | POA: Insufficient documentation

## 2019-11-20 LAB — CBC WITH DIFFERENTIAL/PLATELET
Abs Immature Granulocytes: 0.04 10*3/uL (ref 0.00–0.07)
Basophils Absolute: 0.1 10*3/uL (ref 0.0–0.1)
Basophils Relative: 0 %
Eosinophils Absolute: 0.2 10*3/uL (ref 0.0–0.5)
Eosinophils Relative: 1 %
HCT: 24.6 % — ABNORMAL LOW (ref 39.0–52.0)
Hemoglobin: 8.6 g/dL — ABNORMAL LOW (ref 13.0–17.0)
Immature Granulocytes: 0 %
Lymphocytes Relative: 66 %
Lymphs Abs: 9.3 10*3/uL — ABNORMAL HIGH (ref 0.7–4.0)
MCH: 37.6 pg — ABNORMAL HIGH (ref 26.0–34.0)
MCHC: 35 g/dL (ref 30.0–36.0)
MCV: 107.4 fL — ABNORMAL HIGH (ref 80.0–100.0)
Monocytes Absolute: 1.5 10*3/uL — ABNORMAL HIGH (ref 0.1–1.0)
Monocytes Relative: 11 %
Neutro Abs: 3.1 10*3/uL (ref 1.7–7.7)
Neutrophils Relative %: 22 %
Platelets: 577 10*3/uL — ABNORMAL HIGH (ref 150–400)
RBC: 2.29 MIL/uL — ABNORMAL LOW (ref 4.22–5.81)
RDW: 28.5 % — ABNORMAL HIGH (ref 11.5–15.5)
WBC: 13.5 10*3/uL — ABNORMAL HIGH (ref 4.0–10.5)
nRBC: 62.3 % — ABNORMAL HIGH (ref 0.0–0.2)

## 2019-11-20 LAB — COMPREHENSIVE METABOLIC PANEL
ALT: 22 U/L (ref 0–44)
AST: 25 U/L (ref 15–41)
Albumin: 3.5 g/dL (ref 3.5–5.0)
Alkaline Phosphatase: 49 U/L (ref 38–126)
Anion gap: 10 (ref 5–15)
BUN: 12 mg/dL (ref 6–20)
CO2: 27 mmol/L (ref 22–32)
Calcium: 8.7 mg/dL — ABNORMAL LOW (ref 8.9–10.3)
Chloride: 105 mmol/L (ref 98–111)
Creatinine, Ser: 0.7 mg/dL (ref 0.61–1.24)
GFR calc Af Amer: >60 mL/min (ref >60)
GFR calc non Af Amer: >60 mL/min (ref >60)
Glucose, Bld: 76 mg/dL (ref 70–99)
Potassium: 4 mmol/L (ref 3.5–5.1)
Sodium: 142 mmol/L (ref 135–145)
Total Bilirubin: 2.7 mg/dL — ABNORMAL HIGH (ref 0.3–1.2)
Total Protein: 8.3 g/dL — ABNORMAL HIGH (ref 6.5–8.1)

## 2019-11-20 LAB — RETICULOCYTES
Immature Retic Fract: 39 % — ABNORMAL HIGH (ref 2.3–15.9)
RBC.: 2.31 MIL/uL — ABNORMAL LOW (ref 4.22–5.81)
Retic Count, Absolute: 553 10*3/uL — ABNORMAL HIGH (ref 19.0–186.0)
Retic Ct Pct: 23.5 % — ABNORMAL HIGH (ref 0.4–3.1)

## 2019-11-20 MED ORDER — HEPARIN SOD (PORK) LOCK FLUSH 100 UNIT/ML IV SOLN
500.0000 [IU] | Freq: Once | INTRAVENOUS | Status: AC
  Administered 2019-11-20: 500 [IU]
  Filled 2019-11-20: qty 5

## 2019-11-20 MED ORDER — HYDROMORPHONE HCL 2 MG/ML IJ SOLN
2.0000 mg | INTRAMUSCULAR | Status: AC
  Administered 2019-11-20: 2 mg via SUBCUTANEOUS
  Filled 2019-11-20: qty 1

## 2019-11-20 MED ORDER — SODIUM CHLORIDE 0.9% FLUSH: 3.0000 mL | Freq: Once | INTRAVENOUS | Status: DC

## 2019-11-20 MED ORDER — HYDROMORPHONE HCL 2 MG/ML IJ SOLN
2.0000 mg | Freq: Once | INTRAMUSCULAR | Status: AC
  Administered 2019-11-20: 2 mg via SUBCUTANEOUS

## 2019-11-20 MED ORDER — HYDROMORPHONE HCL 2 MG/ML IJ SOLN
2.0000 mg | Freq: Once | INTRAMUSCULAR | Status: DC
  Filled 2019-11-20: qty 1

## 2019-11-20 MED ORDER — HYDROMORPHONE HCL 2 MG/ML IJ SOLN
2.0000 mg | Freq: Once | INTRAMUSCULAR | Status: AC
  Administered 2019-11-20: 2 mg via SUBCUTANEOUS
  Filled 2019-11-20: qty 1

## 2019-11-20 NOTE — ED Provider Notes (Signed)
[facility]-EMERGENCY DEPT Provider Note   CSN: [id number] Arrival date & time: 11/20/19  0405     History Chief Complaint  Patient presents with  . Sickle Cell Pain Crisis    Martin [name] is a 29 y.o. male history of HIV, GERD, PE and sickle cell disease presents today for sickle cell pain crisis.  He reports he has been compliant with all home medications without relief of his pain.  Describes pain to all of his joints a severe constant throbbing pain nonradiating no clear aggravating or alleviating factors.  He has attempted oxycodone at home without relief of his pain.  No clear inciting event.  Current pain crisis began yesterday morning on 11/19/2019.  Denies fever/chills, headache, vision changes, neck stiffness, chest pain/shortness of breath, cough/hemoptysis, abdominal pain, nausea/vomiting, fall/injury or any additional concerns.  HPI     Past Medical History:  Diagnosis Date  . Anxiety   . HIV (human immunodeficiency virus infection) (HCC)   . Sickle cell crisis (HCC)   . 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HCC)   . Vitamin D deficiency 10/2018    Patient Active Problem List   Diagnosis Date Noted  . Scleral icterus   . Chronic, continuous use of opioids 08/15/2019  . Seasonal allergies 08/15/2019  . Hypoxia   . Chest congestion   . Sickle cell anemia with crisis (HCC) 08/04/2019  . Other chronic pain 08/04/2019  . History of pulmonary embolus (PE) 08/04/2019  . Acute pulmonary embolism (HCC) 02/10/2019  . Acute chest syndrome due to sickle cell crisis (HCC) 02/10/2019  . Sickle cell crisis (HCC) 01/17/2019  . Heart murmur 02/03/2017  . Sickle cell disease with crisis (HCC) 02/02/2017  . 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 Sickle cell pain crisis (HCC) 02/02/2017  . 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HCC) 12/20/2016  . Vitamin D deficiency 08/14/2016  . High risk medication use 09/27/2014  . Generalized anxiety disorder 05/19/2014  . GERD  (gastroesophageal reflux disease) 05/19/2014  . HIV (human immunodeficiency virus infection) (HCC) 05/23/2012    Past Surgical History:  Procedure Laterality Date  . IR IMAGING GUIDED PORT INSERTION  08/29/2019       Family History  Problem Relation Age of Onset  . Sickle cell trait Mother   . Sickle cell trait Father   . Birth defects Maternal Grandmother   . Birth defects Paternal Grandmother     Social History   Tobacco Use  . Smoking status: Never Smoker  . Smokeless tobacco: Never Used  Vaping Use  . Vaping Use: Some days  . Substances: THC  Substance Use Topics  . Alcohol use: No  . Drug use: Yes    Types: Marijuana    Home Medications Prior to Admission medications   Medication Sig Start Date End Date Taking? Authorizing Provider  abacavir-dolutegravir-lamiVUDine (TRIUMEQ) 600-50-300 MG tablet Take 1 tablet by mouth at bedtime.    Yes [provider]  ALPRAZolam Prudy Feeler) 1 MG tablet Take 1 tablet by mouth 3 times daily as needed for anxiety or sleep. Patient taking differently: Take 1 mg by mouth 3 (three) times daily as needed for anxiety or sleep.  10/24/19 11/23/19 Yes [name], [name]  apixaban (ELIQUIS) 5 MG TABS tablet Take 1 tablet (5 mg total) by mouth 2 (two) times daily. 08/21/19  Yes Massie Maroon, [name]  cetirizine (ZYRTEC) 10 MG tablet Take 1 tablet (10 mg total) by mouth daily. 08/04/19  Yes [name], [name]  diphenhydrAMINE (BENADRYL) 25  MG tablet Take 25 mg by mouth every 6 (six) hours as needed for allergies.   Yes [provider]  hydroxyurea (HYDREA) 500 MG capsule Take 4 capsules (2,000 mg total) by mouth at bedtime. 05/26/19  Yes [name], [name]  Multiple Vitamin (MULTIVITAMIN WITH MINERALS) TABS tablet Take 1 tablet by mouth daily with breakfast.    Yes [provider]  naloxone (NARCAN) nasal spray 4 mg/0.1 mL Place 1 spray into the nose once as needed (opiod overdose).   Yes [provider]    oxyCODONE-acetaminophen (PERCOCET) 10-325 MG tablet Take 1 tablet by mouth every 4 (four) hours as needed for pain. 11/07/19  Yes [name], [name]  promethazine (PHENERGAN) 12.5 MG tablet Take 12.5 mg by mouth 3 (three) times daily as needed for nausea/vomiting. 08/19/19  Yes [provider]  TUSSIN DM COUGH + CHEST 10-100 MG/5ML liquid Take 5 mLs by mouth every 6 (six) hours as needed for cough. 11/16/19  Yes [provider]  Vitamin D, Ergocalciferol, (DRISDOL) 1.25 MG (50000 UT) CAPS capsule Take 1 capsule (50,000 Units total) by mouth every 7 (seven) days. Patient taking differently: Take 50,000 Units by mouth every Tuesday.  11/01/18  Yes [name], [name]  naloxone [facility]) 0.4 MG/ML injection As needed. Patient not taking: Reported on 08/15/2019 11/28/18   [name], [name]    Allergies    Tape and Ketorolac  Review of Systems   Review of Systems Ten systems are reviewed and are negative for acute change except as noted in the HPI  Physical Exam Updated Vital Signs BP 101/70 (BP Location: Left Arm)   Pulse 72   Temp 98.2 F (36.8 C) (Oral)   Resp 14   Ht 6\' 3"  (1.905 m)   Wt 68 kg   SpO2 100%   BMI 18.75 kg/m   Physical Exam Constitutional:      General: He is not in acute distress.    Appearance: Normal appearance. He is well-developed. He is not ill-appearing or diaphoretic.  HENT:     Head: Normocephalic and atraumatic.  Eyes:     General: Vision grossly intact. Gaze aligned appropriately.     Pupils: Pupils are equal, round, and reactive to light.  Neck:     Trachea: Trachea and phonation normal.  Pulmonary:     Effort: Pulmonary effort is normal. No respiratory distress.  Abdominal:     General: There is no distension.     Palpations: Abdomen is soft.     Tenderness: There is no abdominal tenderness. There is no guarding or rebound.  Musculoskeletal:        General: Normal range of motion.     Cervical back: Normal range of  motion.     Comments: All major joints mobilized with appropriate range of motion and strength without deformity.  Skin:    General: Skin is warm and dry.  Neurological:     Mental Status: He is alert.     GCS: GCS eye subscore is 4. GCS verbal subscore is 5. GCS motor subscore is 6.     Comments: Speech is clear and goal oriented, follows commands Major Cranial nerves without deficit, no facial droop Moves extremities without ataxia, coordination intact  Psychiatric:        Behavior: Behavior normal.     ED Results / Procedures / Treatments   Labs (all labs ordered are listed, but only abnormal results are displayed) Labs Reviewed  COMPREHENSIVE METABOLIC  PANEL - Abnormal; Notable for the following components:      Result Value   Calcium 8.7 (*)    Total Protein 8.3 (*)    Total Bilirubin 2.7 (*)    All other components within normal limits  CBC WITH DIFFERENTIAL/PLATELET - Abnormal; Notable for the following components:   WBC 13.5 (*)    RBC 2.29 (*)    Hemoglobin 8.6 (*)    HCT 24.6 (*)    MCV 107.4 (*)    MCH 37.6 (*)    RDW 28.5 (*)    Platelets 577 (*)    nRBC 62.3 (*)    Lymphs Abs 9.3 (*)    Monocytes Absolute 1.5 (*)    All other components within normal limits  RETICULOCYTES - Abnormal; Notable for the following components:   Retic Ct Pct 23.5 (*)    RBC. 2.31 (*)    Retic Count, Absolute 553.0 (*)    Immature Retic Fract 39.0 (*)    All other components within normal limits    EKG None  Radiology No results found.  Procedures Procedures (including critical care time)  Medications Ordered in ED Medications  sodium chloride flush (NS) 0.9 % injection 3 mL (has no administration in time range)  heparin lock flush 100 unit/mL (has no administration in time range)  HYDROmorphone (DILAUDID) injection 2 mg (2 mg Subcutaneous Given 11/20/19 0738)  HYDROmorphone (DILAUDID) injection 2 mg (2 mg Subcutaneous Given 11/20/19 0822)  HYDROmorphone (DILAUDID)  injection 2 mg (2 mg Subcutaneous Given 11/20/19 0856)  HYDROmorphone (DILAUDID) injection 2 mg (2 mg Subcutaneous Given 11/20/19 1013)    ED Course  I have reviewed the triage vital signs and the nursing notes.  Pertinent labs & imaging results that were available during my care of the patient were reviewed by me and considered in my medical decision making (see chart for details).  Clinical Course as of Nov 19 1049  Mon Nov 20, 2019  [id number] [place]   [BM]    Clinical Course User Index [BM] Elizabeth PalauMorelli, Josslynn Mentzer A, PA-C   MDM Rules/Calculators/A&P                          Additional history obtained from: 1. Nursing notes from this visit. 2. Electronic medical record reviewed.  Patient with multiple ED presentations this year for the same and multiple sickle cell clinic admissions. ---------------------------------------------- 29 year old male history as detailed above presents today with what he describes as his typical sickle cell pain onset 2 days ago no relief with home oxycodone.  He describes pain to all of his joints without injury or trauma.  He denies any fever or other infectious symptoms.  Denies chest pain shortness of breath or cough.  On exam he is well-appearing no acute distress vital signs within normal limits.  Labs ordered in triage include CBC, reticulocyte count and CMP.  I ordered, reviewed and interpreted labs which include: CBC shows mild leuks ptosis of 13.5 near baseline, no symptoms to suggest infection.  Hemoglobin of 8.6 appears baseline.  Reticulocyte count elevated consistent with sickle cell disease.   CMP shows no emergent electrolyte derangement, AKI, acute elevation of LFTs or gap.  Bilirubin improved from prior.  Patient received 3 doses of subcu Dilaudid, reevaluated multiple times well-appearing no acute distress using his phone vital signs stable on room air.  He reports pain continues and would like to be transferred over to the sickle cell clinic, consult  placed the sickle cell team. ----------------------------- 9:50 AM: Spoke with sickle cell clinic provider [facility]. Advises that Sickle Cell clinic is closed today and that patient can follow-up with their clinic at 8:30 AM tomorrow morning.  Fourth dose Dilaudid ordered, will reassess afterwards. ------------------------------- Patient re-evaluated, resting comfortably, no acute distress, vital signs within normal limits. Patient reports he is feeling better and would like to be discharged. He plans to follow-up tomorrow morning at 8:30 AM at the sickle-cell clinic. No symptoms or evidence to suggest acute chest syndrome, bacterial infection or other emergent pathologies.  At this time there does not appear to be any evidence of an acute emergency medical condition and the patient appears stable for discharge with appropriate outpatient follow up. Diagnosis was discussed with patient who verbalizes understanding of care plan and is agreeable to discharge. I have discussed return precautions with patient who verbalizes understanding. Patient encouraged to follow-up with their PCP and sickle-cell clinic. All questions answered.  Patient's case discussed with Dr. Bernette Mayers who agrees with plan to discharge with follow-up.   Note: Portions of this report may have been transcribed using voice recognition software. Every effort was made to ensure accuracy; however, inadvertent computerized transcription errors may still be present. Final Clinical Impression(s) / ED Diagnoses Final diagnoses:  Sickle cell pain crisis [facility])    Rx / DC Orders ED Discharge Orders    None       Elizabeth Palau 11/20/19 1052    Pollyann Savoy, MD 11/20/19 [phone]

## 2019-11-20 NOTE — ED Triage Notes (Signed)
Patient is complaining of sickle cell crisis.

## 2019-11-20 NOTE — Discharge Instructions (Addendum)
At this time there does not appear to be the presence of an emergent medical condition, however there is always the potential for conditions to change. Please read and follow the below instructions.  Please return to the Emergency Department immediately for any new or worsening symptoms. Please be sure to follow up with your Primary Care Provider within one week regarding your visit today; please call their office to schedule an appointment even if you are feeling better for a follow-up visit. Go to the sickle cell clinic tomorrow morning at 8:30 AM when they open.  Return to the ER for any new or worsening symptoms. You received pain medicine today, this medication will make you drowsy.  Do not drive, drink alcohol or perform any dangerous activities for the rest of the day.  Get help right away if: You have symptoms of infection. These include: Fever. Chills. Being very tired. Irritability. Poor eating. Throwing up (vomiting). You feel dizzy or faint. You have new stomach pain, especially on the left side. You have a an erection (priapism) that lasts more than 4 hours. You have numbness in your arms or legs. You have a hard time moving your arms or legs. You have trouble talking. You have pain that does not go away when you take medicine. You are short of breath. You are breathing fast. You have a long-term cough. You have pain in your chest. You have a bad headache. You have a stiff neck. Your stomach looks bloated even though you did not eat much. Your skin is pale. You suddenly cannot see well. You have any new/concerning or worsening of symptoms  Please read the additional information packets attached to your discharge summary.  Do not take your medicine if  develop an itchy rash, swelling in your mouth or lips, or difficulty breathing; call 911 and seek immediate emergency medical attention if this occurs.  You may review your lab tests and imaging results in their  entirety on your MyChart account.  Please discuss all results of fully with your primary care provider and other specialist at your follow-up visit.  Note: Portions of this text may have been transcribed using voice recognition software. Every effort was made to ensure accuracy; however, inadvertent computerized transcription errors may still be present.

## 2019-11-21 ENCOUNTER — Encounter (HOSPITAL_COMMUNITY): Payer: Self-pay | Admitting: Family Medicine

## 2019-11-21 ENCOUNTER — Telehealth (HOSPITAL_COMMUNITY): Payer: Self-pay | Admitting: *Deleted

## 2019-11-21 ENCOUNTER — Inpatient Hospital Stay (HOSPITAL_COMMUNITY): Payer: Medicaid Other

## 2019-11-21 ENCOUNTER — Inpatient Hospital Stay (HOSPITAL_COMMUNITY)
Admission: AD | Admit: 2019-11-21 | Discharge: 2019-11-23 | DRG: 812 | Disposition: A | Discharge status: Home / Self Care | Payer: Medicaid Other | Attending: Family Medicine | Admitting: Family Medicine

## 2019-11-21 ENCOUNTER — Other Ambulatory Visit: Payer: Self-pay

## 2019-11-21 DIAGNOSIS — G894 Chronic pain syndrome: Secondary | ICD-10-CM | POA: Diagnosis present

## 2019-11-21 DIAGNOSIS — Z86711 Personal history of pulmonary embolism: Secondary | ICD-10-CM | POA: Diagnosis present

## 2019-11-21 DIAGNOSIS — D5700 Hb-SS disease with crisis, unspecified: Secondary | ICD-10-CM | POA: Diagnosis present

## 2019-11-21 DIAGNOSIS — Z20822 Contact with and (suspected) exposure to covid-19: Secondary | ICD-10-CM | POA: Diagnosis present

## 2019-11-21 DIAGNOSIS — Z79899 Other long term (current) drug therapy: Secondary | ICD-10-CM

## 2019-11-21 DIAGNOSIS — Z21 Asymptomatic human immunodeficiency virus [HIV] infection status: Secondary | ICD-10-CM | POA: Diagnosis present

## 2019-11-21 DIAGNOSIS — Z7901 Long term (current) use of anticoagulants: Secondary | ICD-10-CM | POA: Diagnosis not present

## 2019-11-21 DIAGNOSIS — F411 Generalized anxiety disorder: Secondary | ICD-10-CM | POA: Diagnosis present

## 2019-11-21 DIAGNOSIS — G8929 Other chronic pain: Secondary | ICD-10-CM

## 2019-11-21 DIAGNOSIS — B20 Human immunodeficiency virus [HIV] disease: Secondary | ICD-10-CM | POA: Diagnosis present

## 2019-11-21 DIAGNOSIS — Z832 Family history of diseases of the blood and blood-forming organs and certain disorders involving the immune mechanism: Secondary | ICD-10-CM

## 2019-11-21 LAB — SARS CORONAVIRUS 2 BY RT PCR (HOSPITAL ORDER, PERFORMED IN ~~LOC~~ HOSPITAL LAB): SARS Coronavirus 2: NEGATIVE

## 2019-11-21 MED ORDER — DIPHENHYDRAMINE HCL 25 MG PO CAPS
25.0000 mg | ORAL_CAPSULE | ORAL | Status: DC | PRN
  Administered 2019-11-22: 25 mg via ORAL
  Filled 2019-11-21: qty 1

## 2019-11-21 MED ORDER — HYDROXYUREA 500 MG PO CAPS
2000.0000 mg | ORAL_CAPSULE | Freq: Every day | ORAL | Status: DC
  Administered 2019-11-21 – 2019-11-22 (×2): 2000 mg via ORAL
  Filled 2019-11-21 (×2): qty 4

## 2019-11-21 MED ORDER — LORATADINE 10 MG PO TABS
10.0000 mg | ORAL_TABLET | Freq: Every day | ORAL | Status: DC
  Administered 2019-11-22: 10 mg via ORAL
  Filled 2019-11-21 (×2): qty 1

## 2019-11-21 MED ORDER — ACETAMINOPHEN 500 MG PO TABS
1000.0000 mg | ORAL_TABLET | Freq: Once | ORAL | Status: AC
  Administered 2019-11-21: 1000 mg via ORAL
  Filled 2019-11-21: qty 2

## 2019-11-21 MED ORDER — KETOROLAC TROMETHAMINE 30 MG/ML IJ SOLN: 15.0000 mg | Freq: Once | INTRAMUSCULAR | Status: DC

## 2019-11-21 MED ORDER — SODIUM CHLORIDE 0.9% FLUSH: 9.0000 mL | INTRAVENOUS | Status: DC | PRN

## 2019-11-21 MED ORDER — APIXABAN 5 MG PO TABS
5.0000 mg | ORAL_TABLET | Freq: Two times a day (BID) | ORAL | Status: DC
  Administered 2019-11-21 – 2019-11-23 (×4): 5 mg via ORAL
  Filled 2019-11-21 (×4): qty 1

## 2019-11-21 MED ORDER — SODIUM CHLORIDE 0.45 % IV SOLN: INTRAVENOUS | Status: DC

## 2019-11-21 MED ORDER — NALOXONE HCL 0.4 MG/ML IJ SOLN: 0.4000 mg | INTRAMUSCULAR | Status: DC | PRN

## 2019-11-21 MED ORDER — HEPARIN SOD (PORK) LOCK FLUSH 100 UNIT/ML IV SOLN
500.0000 [IU] | INTRAVENOUS | Status: DC | PRN
  Administered 2019-11-23: 500 [IU]
  Filled 2019-11-21: qty 5

## 2019-11-21 MED ORDER — SENNOSIDES-DOCUSATE SODIUM 8.6-50 MG PO TABS
1.0000 | ORAL_TABLET | Freq: Two times a day (BID) | ORAL | Status: DC
  Administered 2019-11-22: 1 via ORAL
  Filled 2019-11-21 (×3): qty 1

## 2019-11-21 MED ORDER — POLYETHYLENE GLYCOL 3350 17 G PO PACK: 17.0000 g | PACK | Freq: Every day | ORAL | Status: DC | PRN

## 2019-11-21 MED ORDER — ONDANSETRON HCL 4 MG/2ML IJ SOLN: 4.0000 mg | Freq: Four times a day (QID) | INTRAMUSCULAR | Status: DC | PRN

## 2019-11-21 MED ORDER — ALPRAZOLAM 1 MG PO TABS
1.0000 mg | ORAL_TABLET | Freq: Three times a day (TID) | ORAL | Status: DC | PRN
  Administered 2019-11-21 – 2019-11-22 (×2): 1 mg via ORAL
  Filled 2019-11-21 (×2): qty 1

## 2019-11-21 MED ORDER — HYDROMORPHONE 1 MG/ML IV SOLN
INTRAVENOUS | Status: DC
  Administered 2019-11-21 (×2): 10 mg via INTRAVENOUS
  Administered 2019-11-21 (×2): 30 mg via INTRAVENOUS
  Administered 2019-11-22: 10 mg via INTRAVENOUS
  Administered 2019-11-22: 1.29 mg via INTRAVENOUS
  Filled 2019-11-21 (×2): qty 30

## 2019-11-21 MED ORDER — ADULT MULTIVITAMIN W/MINERALS CH
1.0000 | ORAL_TABLET | Freq: Every day | ORAL | Status: DC
  Administered 2019-11-23: 1 via ORAL
  Filled 2019-11-21 (×2): qty 1

## 2019-11-21 MED ORDER — ABACAVIR-DOLUTEGRAVIR-LAMIVUD 600-50-300 MG PO TABS
1.0000 | ORAL_TABLET | Freq: Every day | ORAL | Status: DC
  Administered 2019-11-21 – 2019-11-22 (×2): 1 via ORAL
  Filled 2019-11-21 (×2): qty 1

## 2019-11-21 NOTE — Telephone Encounter (Signed)
Pt called requesting to come in to the patient care center.  Pt complains of 10/10 pain everywhere.  Pt last took pain 10mg  percocet at 9:30 am.  Pt was last seen in the ED Sunday night 11/19/19.  Pt denies fever, chest pian, nausea/vomiting, diarrhea, abdominal pain, and priapism.  Pt states that they have transportation at time of discharge.  Provider was notified and said pt could come into [facility]  but had to get here by 12:00pm.  Pt was notified and verbalized understanding.

## 2019-11-21 NOTE — Progress Notes (Signed)
Patient admitted to the day hospital for treatment of sickle cell pain crisis. Patient reported generalized pain rated 10/10  . Patient placed on Dilaudid PCA, given PO tylenol and hydrated with IV fluids. Patient transferred to [facility] long 6 east room 05. Report given to [facility].  Pain on transfer is 7/10. Patient alert, oriented and transported in a wheelchair.

## 2019-11-21 NOTE — H&P (Signed)
[facility] History and Physical   Date: 11/21/2019  Patient name: Martin [name] Medical record number: [id number] Date of birth: [birth year] Age: 29 y.o. Gender: male PCP: [name], [name]  Attending physician: Quentin Angst, MD  Chief Complaint: Sickle cell pain  History of Present Illness: Martin [name] is a 29 year old male with a medical history significant for sickle cell disease, chronic pain syndrome, opiate dependence and tolerance, history of PE on Eliquis, HIV on antiretroviral therapy, and history of anemia of chronic disease who presents complaining of generalized pain that is consistent with his typical pain crisis.  Patient states that he was treated and evaluated for this problem in the emergency room on 11/19/2019 and pain improved prior to discharge.  Patient also says that he was admitted for pneumonia in [place], [place] 3 weeks prior and has completed antibiotics.  However, he continues to have persistent cough.  Also, patient states that cough is productive.  He also endorses some fatigue.  His current pain intensity is 9/10 characterized as constant and throbbing.  He last had Percocet this a.m. without sustained relief.  He denies headache, shortness of breath, paresthesias, fever, chills, urinary symptoms, nausea, vomiting, or diarrhea.  He denies any sick contacts or exposure to COVID-19 infection.  Meds: Medications Prior to Admission  Medication Sig Dispense Refill Last Dose  . abacavir-dolutegravir-lamiVUDine (TRIUMEQ) 600-50-300 MG tablet Take 1 tablet by mouth at bedtime.      . ALPRAZolam (XANAX) 1 MG tablet Take 1 tablet by mouth 3 times daily as needed for anxiety or sleep. (Patient taking differently: Take 1 mg by mouth 3 (three) times daily as needed for anxiety or sleep. ) 30 tablet 0   . apixaban (ELIQUIS) 5 MG TABS tablet Take 1 tablet (5 mg total) by mouth 2 (two) times daily. 60 tablet 5   . cetirizine (ZYRTEC) 10 MG  tablet Take 1 tablet (10 mg total) by mouth daily. 30 tablet 11   . diphenhydrAMINE (BENADRYL) 25 MG tablet Take 25 mg by mouth every 6 (six) hours as needed for allergies.     . hydroxyurea (HYDREA) 500 MG capsule Take 4 capsules (2,000 mg total) by mouth at bedtime. 120 capsule 3   . Multiple Vitamin (MULTIVITAMIN WITH MINERALS) TABS tablet Take 1 tablet by mouth daily with breakfast.      . naloxone (NARCAN) 0.4 MG/ML injection As needed. (Patient not taking: Reported on 08/15/2019) 1 mL 2   . naloxone (NARCAN) nasal spray 4 mg/0.1 mL Place 1 spray into the nose once as needed (opiod overdose).     Marland Kitchen oxyCODONE-acetaminophen (PERCOCET) 10-325 MG tablet Take 1 tablet by mouth every 4 (four) hours as needed for pain. 90 tablet 0   . promethazine (PHENERGAN) 12.5 MG tablet Take 12.5 mg by mouth 3 (three) times daily as needed for nausea/vomiting.     Elmo Putt DM COUGH + CHEST 10-100 MG/5ML liquid Take 5 mLs by mouth every 6 (six) hours as needed for cough.     . Vitamin D, Ergocalciferol, (DRISDOL) 1.25 MG (50000 UT) CAPS capsule Take 1 capsule (50,000 Units total) by mouth every 7 (seven) days. (Patient taking differently: Take 50,000 Units by mouth every Tuesday. ) 5 capsule 3     Allergies: Tape and Ketorolac Past Medical History:  Diagnosis Date  . Anxiety   . HIV (human immunodeficiency virus infection) (HCC)   . Sickle cell crisis (HCC)   . 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HCC)   .  Vitamin D deficiency 10/2018   Past Surgical History:  Procedure Laterality Date  . IR IMAGING GUIDED PORT INSERTION  08/29/2019   Family History  Problem Relation Age of Onset  . Sickle cell trait Mother   . Sickle cell trait Father   . Birth defects Maternal Grandmother   . Birth defects Paternal Grandmother    Social History   Socioeconomic History  . Marital status: Single    Spouse name: Not on file  . Number of children: Not on file  . Years of education: Not on file  . Highest education level: Not on  file  Occupational History  . Not on file  Tobacco Use  . Smoking status: Never Smoker  . Smokeless tobacco: Never Used  Vaping Use  . Vaping Use: Some days  . Substances: THC  Substance and Sexual Activity  . Alcohol use: No  . Drug use: Yes    Types: Marijuana  . Sexual activity: Yes    Birth control/protection: Condom  Other Topics Concern  . Not on file  Social History Narrative  . Not on file   Social Determinants of Health   Financial Resource Strain:   . Difficulty of Paying Living Expenses:   Food Insecurity:   . Worried About Programme researcher, broadcasting/film/video in the Last Year:   . Barista in the Last Year:   Transportation Needs:   . Freight forwarder (Medical):   Marland Kitchen Lack of Transportation (Non-Medical):   Physical Activity:   . Days of Exercise per Week:   . Minutes of Exercise per Session:   Stress:   . Feeling of Stress :   Social Connections:   . Frequency of Communication with Friends and Family:   . Frequency of Social Gatherings with Friends and Family:   . Attends Religious Services:   . Active Member of Clubs or Organizations:   . Attends Banker Meetings:   Marland Kitchen Marital Status:   Intimate Partner Violence:   . Fear of Current or Ex-Partner:   . Emotionally Abused:   Marland Kitchen Physically Abused:   . Sexually Abused:     Review of Systems: Review of Systems  Constitutional: Positive for malaise/fatigue. Negative for chills and fever.  HENT: Negative.   Eyes: Negative.   Respiratory: Positive for cough and sputum production. Negative for shortness of breath.   Cardiovascular: Negative.  Negative for chest pain.  Gastrointestinal: Negative.   Genitourinary: Negative.   Musculoskeletal: Positive for back pain and joint pain.  Neurological: Negative.   Psychiatric/Behavioral: Negative.      Physical Exam: There were no vitals taken for this visit. Physical Exam Constitutional:      Appearance: Normal appearance.  HENT:      Mouth/Throat:     Mouth: Mucous membranes are moist.     Pharynx: Oropharynx is clear.  Cardiovascular:     Rate and Rhythm: Normal rate and regular rhythm.     Pulses: Normal pulses.  Pulmonary:     Breath sounds: No rales.  Abdominal:     General: Abdomen is flat. Bowel sounds are normal.  Musculoskeletal:        General: Normal range of motion.  Skin:    General: Skin is warm.  Neurological:     General: No focal deficit present.     Mental Status: He is alert. Mental status is at baseline.  Psychiatric:        Mood and Affect: Mood normal.  Behavior: Behavior normal.        Thought Content: Thought content normal.        Judgment: Judgment normal.      Lab results: No results found for this or any previous visit (from the past 24 hour(s)).  Imaging results:  No results found.   Assessment & Plan: Patient admitted to sickle cell day infusion center for management of pain crisis.  Patient is opiate tolerant Initiate IV dilaudid PCA. Settings of 0.5 mg, 10-minute lockout, and 3 mg/h. IV fluids, 0.45 percent saline at 100 mL/h Tylenol 1000 mg by mouth times one dose Review CBC with differential, complete metabolic panel, and reticulocytes as results become available. Baseline hemoglobin is 9-10 Pain intensity will be reevaluated in context of functioning and relationship to baseline as care progress If pain intensity remains elevated and/or sudden change in hemodynamic stability transition to inpatient services for higher level of care.      Julianne Handler 11/21/2019, 12:27 PM

## 2019-11-21 NOTE — H&P (Addendum)
H&P  Patient Demographics:  Martin [name], is a 29 y.o. male  MRN: [id number]   DOB - 05-Feb-1991  Admit Date - 11/21/2019  Outpatient Primary MD for the patient is [name], [name]      HPI:   Martin [name] is a 29 year old male with a medical history significant for sickle cell disease, chronic pain syndrome, opiate dependence and tolerance, history of PE on Eliquis, HIV on antiretroviral therapy, and history of anemia of chronic disease who presents complaining of generalized pain that is consistent with his typical pain crisis.  Patient states that he was treated and evaluated for this problem in the emergency room on 11/19/2019 and pain improved prior to discharge.  Patient also says that he was admitted for pneumonia in [place], [place] 3 weeks prior and has completed antibiotics.  However, he continues to have persistent cough.  Also, patient states that cough is productive.  He also endorses some fatigue.  His current pain intensity is 9/10 characterized as constant and throbbing.  He last had Percocet this a.m. without sustained relief.  He denies headache, shortness of breath, paresthesias, fever, chills, urinary symptoms, nausea, vomiting, or diarrhea.  He denies any sick contacts or exposure to COVID-19 infection.  Sickle cell center course:  Patient's vital signs reported as:BP 101/73 (BP Location: Right Arm)   Pulse 78   Temp 98 F (36.7 C) (Oral)   Resp 10   Ht 6\' 3"  (1.905 m)   Wt 64.7 kg   SpO2 90%   BMI 17.84 kg/m .  WBCs 13.5, patient is afebrile.  Hemoglobin 8.6, appears to be consistent with patient's baseline.  Total bilirubin is elevated at 2.7.  Remaining labs are largely within normal limits.  COVID-19 test pending.  Patient's pain persists despite IV fluids and IV Dilaudid PCA.  Patient transition to medical floor for further management of sickle cell pain crisis.   Review of systems:  In addition to the HPI above, patient reports No fever or chills No  Headache, No changes with vision or hearing Review of Systems  Constitutional: Positive for malaise/fatigue. Negative for chills and fever.  HENT: Negative.   Eyes: Negative.   Respiratory: Positive for cough and sputum production.   Cardiovascular: Negative.   Gastrointestinal: Negative.  Negative for abdominal pain and vomiting.  Genitourinary: Negative.   Musculoskeletal: Positive for back pain and joint pain.  Skin: Negative.   Neurological: Negative.   Endo/Heme/Allergies: Negative.   Psychiatric/Behavioral: Negative.     A full 10 point Review of Systems was done, except as stated above, all other Review of Systems were negative.  With Past History of the following :   Past Medical History:  Diagnosis Date  . Anxiety   . HIV (human immunodeficiency virus infection) (HCC)   . Sickle cell crisis (HCC)   . 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HCC)   . Vitamin D deficiency 10/2018      Past Surgical History:  Procedure Laterality Date  . IR IMAGING GUIDED PORT INSERTION  08/29/2019     Social History:   Social History   Tobacco Use  . Smoking status: Never Smoker  . Smokeless tobacco: Never Used  Substance Use Topics  . Alcohol use: No     Lives - At home   Family History :   Family History  Problem Relation Age of Onset  . Sickle cell trait Mother   . Sickle cell trait Father   . Birth defects Maternal Grandmother   .  Birth defects Paternal Grandmother      Home Medications:   Prior to Admission medications   Medication Sig Start Date End Date Taking? Authorizing Provider  abacavir-dolutegravir-lamiVUDine (TRIUMEQ) 600-50-300 MG tablet Take 1 tablet by mouth at bedtime.    Yes [provider]  ALPRAZolam Prudy Feeler) 1 MG tablet Take 1 tablet by mouth 3 times daily as needed for anxiety or sleep. Patient taking differently: Take 1 mg by mouth 3 (three) times daily as needed for anxiety or sleep.  10/24/19 11/23/19 Yes [name], [name]  apixaban (ELIQUIS) 5 MG  TABS tablet Take 1 tablet (5 mg total) by mouth 2 (two) times daily. 08/21/19  Yes Massie Maroon, [name]  cetirizine (ZYRTEC) 10 MG tablet Take 1 tablet (10 mg total) by mouth daily. Patient taking differently: Take 10 mg by mouth daily as needed for allergies.  08/04/19  Yes [name], [name]  diphenhydrAMINE (BENADRYL) 25 MG tablet Take 25 mg by mouth every 6 (six) hours as needed for allergies.   Yes [provider]  hydroxyurea (HYDREA) 500 MG capsule Take 4 capsules (2,000 mg total) by mouth at bedtime. 05/26/19  Yes [name], [name]  Multiple Vitamin (MULTIVITAMIN WITH MINERALS) TABS tablet Take 1 tablet by mouth daily with breakfast.    Yes [provider]  oxyCODONE-acetaminophen (PERCOCET) 10-325 MG tablet Take 1 tablet by mouth every 4 (four) hours as needed for pain. 11/07/19  Yes [name], [name]  Vitamin D, Ergocalciferol, (DRISDOL) 1.25 MG (50000 UT) CAPS capsule Take 1 capsule (50,000 Units total) by mouth every 7 (seven) days. Patient taking differently: Take 50,000 Units by mouth every 7 (seven) days. Every Monday 11/01/18  Yes [name], [name]  naloxone [facility]) 0.4 MG/ML injection As needed. Patient not taking: Reported on 08/15/2019 11/28/18   [name], [name]     Allergies:   Allergies  Allergen Reactions  . Tape Rash and Other (See Comments)    PLEASE DO NOT USE THE CLEAR, THICK, "PLASTIC" TAPE- only paper tape is tolerated   . Ketorolac Swelling and Other (See Comments)    Patient reports facial edema and left arm edema after administration.     Physical Exam:   Vitals:   Vitals:   11/21/19 1605 11/21/19 1657  BP: 106/65 101/73  Pulse: 85 78  Resp: 16 10  Temp: 98.3 F (36.8 C) 98 F (36.7 C)  SpO2: 92% 90%    Physical Exam: Constitutional: Patient appears well-developed and well-nourished. Not in obvious distress. HENT: Normocephalic, atraumatic, External right and left ear normal. Oropharynx is clear and moist.   Eyes: Conjunctivae and EOM are normal. PERRLA, no scleral icterus. Neck: Normal ROM. Neck supple. No JVD. No tracheal deviation. No thyromegaly. CVS: RRR, S1/S2 +, no murmurs, no gallops, no carotid bruit.  Pulmonary: Effort and breath sounds normal, no stridor, rhonchi, wheezes, rales.  Abdominal: Soft. BS +, no distension, tenderness, rebound or guarding.  Musculoskeletal: Normal range of motion. No edema and no tenderness.  Lymphadenopathy: No lymphadenopathy noted, cervical, inguinal or axillary Neuro: Alert. Normal reflexes, muscle tone coordination. No cranial nerve deficit. Skin: Skin is warm and dry. No rash noted. Not diaphoretic. No erythema. No pallor. Psychiatric: Normal mood and affect. Behavior, judgment, thought content normal.   Data Review:   CBC Recent Labs  Lab 11/20/19 0732  WBC 13.5*  HGB 8.6*  HCT 24.6*  PLT 577*  MCV 107.4*  MCH 37.6*  MCHC 35.0  RDW  28.5*  LYMPHSABS 9.3*  MONOABS 1.5*  EOSABS 0.2  BASOSABS 0.1   ------------------------------------------------------------------------------------------------------------------  Chemistries  Recent Labs  Lab 11/20/19 0732  NA 142  K 4.0  CL 105  CO2 27  GLUCOSE 76  BUN 12  CREATININE 0.70  CALCIUM 8.7*  AST 25  ALT 22  ALKPHOS 49  BILITOT 2.7*   ------------------------------------------------------------------------------------------------------------------ estimated creatinine clearance is 124.7 mL/min (by C-G formula based on SCr of 0.7 mg/dL). ------------------------------------------------------------------------------------------------------------------ No results for input(s): TSH, T4TOTAL, T3FREE, THYROIDAB in the last 72 hours.  Invalid input(s): FREET3  Coagulation profile No results for input(s): INR, PROTIME in the last 168 hours. ------------------------------------------------------------------------------------------------------------------- No results for input(s):  DDIMER in the last 72 hours. -------------------------------------------------------------------------------------------------------------------  Cardiac Enzymes No results for input(s): CKMB, TROPONINI, MYOGLOBIN in the last 168 hours.  Invalid input(s): CK ------------------------------------------------------------------------------------------------------------------ No results found for: BNP  ---------------------------------------------------------------------------------------------------------------  Urinalysis    Component Value Date/Time   COLORURINE AMBER (A) 03/26/2019 0128   APPEARANCEUR CLEAR 03/26/2019 0128   LABSPEC 1.014 03/26/2019 0128   PHURINE 6.0 03/26/2019 0128   GLUCOSEU NEGATIVE 03/26/2019 0128   HGBUR SMALL (A) 03/26/2019 0128   BILIRUBINUR small 09/27/2019 1440   KETONESUR NEGATIVE 03/26/2019 0128   PROTEINUR Positive (A) 09/27/2019 1440   PROTEINUR NEGATIVE 03/26/2019 0128   UROBILINOGEN 2.0 (A) 09/27/2019 1440   UROBILINOGEN 4.0 (H) 05/21/2009 1944   NITRITE negative 09/27/2019 1440   NITRITE NEGATIVE 03/26/2019 0128   LEUKOCYTESUR Negative 09/27/2019 1440   LEUKOCYTESUR NEGATIVE 03/26/2019 0128    ----------------------------------------------------------------------------------------------------------------   Imaging Results:    No results found.     Assessment & Plan:  Active Problems:   Sickle cell pain crisis (HCC)  Sickle cell disease with pain crisis: Admit, continue IV fluids at 0.45% saline at 100 mL/h.  IV Dilaudid PCA with settings of 0.5 mg, 10-minute lockout, and 3 mg/h. Hold Percocet, use PCA Dilaudid. Monitor vital signs closely, reevaluate pain scale regularly, and supplemental oxygen as needed. Pain will be reevaluated in context of function and relationship to baseline as patient's care progresses.  Chronic pain syndrome: We will transition to home medication as pain intensity improved  Sickle cell anemia Hemoglobin  8.6, which is consistent with patient's baseline.  There is no clinical indication for blood transfusion at this time. Continue folic acid and hydroxyurea Repeat CBC in a.m.  HIV disease: Stable.  Continue home medications.  History of PE: Continue Eliquis 5 mg twice daily  Leukocytosis: WBCs 13.5.  Considered to be reactive.  Patie COVID-19 test pending.  Patient is afebrile without any signs of infection or inflammation.  Continue to monitor closely without antibiotics.  Follow CBC in a.m.  Generalized anxiety disorder: Stable.  Continue home medications.  Patient denies any suicidal homicidal intent.    DVT Prophylaxis: Subcut Lovenox   AM Labs Ordered, also please review Full Orders  Family Communication: Admission, patient's condition and plan of care including tests being ordered have been discussed with the patient who indicate understanding and agree with the plan and Code Status.  Code Status: Full Code  Consults called: None    Admission status: Inpatient    Time spent in minutes : 30 minutes  [name]  APRN, [name], [name] Patient Care [facility] [address] [place], [place] [address] [phone]  11/21/2019 at 6:03 PM

## 2019-11-22 LAB — CBC
HCT: 21.4 % — ABNORMAL LOW (ref 39.0–52.0)
Hemoglobin: 8.9 g/dL — ABNORMAL LOW (ref 13.0–17.0)
MCH: 45.4 pg — ABNORMAL HIGH (ref 26.0–34.0)
MCHC: 41.6 g/dL — ABNORMAL HIGH (ref 30.0–36.0)
MCV: 109.2 fL — ABNORMAL HIGH (ref 80.0–100.0)
Platelets: 487 10*3/uL — ABNORMAL HIGH (ref 150–400)
RBC: 1.96 MIL/uL — ABNORMAL LOW (ref 4.22–5.81)
RDW: 26.6 % — ABNORMAL HIGH (ref 11.5–15.5)
WBC: 11.7 10*3/uL — ABNORMAL HIGH (ref 4.0–10.5)
nRBC: 34.2 % — ABNORMAL HIGH (ref 0.0–0.2)

## 2019-11-22 MED ORDER — FLEET ENEMA 7-19 GM/118ML RE ENEM
1.0000 | ENEMA | Freq: Every day | RECTAL | Status: DC | PRN
  Administered 2019-11-22: 1 via RECTAL
  Filled 2019-11-22: qty 1

## 2019-11-22 MED ORDER — LIP MEDEX EX OINT
TOPICAL_OINTMENT | CUTANEOUS | Status: DC | PRN
  Filled 2019-11-22 (×4): qty 7

## 2019-11-22 MED ORDER — GUAIFENESIN-DM 100-10 MG/5ML PO SYRP
5.0000 mL | ORAL_SOLUTION | ORAL | Status: DC | PRN
  Administered 2019-11-22: 5 mL via ORAL
  Filled 2019-11-22: qty 10

## 2019-11-22 MED ORDER — OXYCODONE HCL 5 MG PO TABS
5.0000 mg | ORAL_TABLET | ORAL | Status: DC | PRN
  Administered 2019-11-22 – 2019-11-23 (×4): 5 mg via ORAL
  Filled 2019-11-22 (×4): qty 1

## 2019-11-22 MED ORDER — HYDROMORPHONE 1 MG/ML IV SOLN
INTRAVENOUS | Status: DC
  Administered 2019-11-22: 6 mg via INTRAVENOUS
  Administered 2019-11-22: 7.5 mg via INTRAVENOUS
  Administered 2019-11-22: 30 mg via INTRAVENOUS
  Administered 2019-11-22: 10.5 mg via INTRAVENOUS
  Administered 2019-11-22: 7 mg via INTRAVENOUS
  Administered 2019-11-23: 4.6 mg via INTRAVENOUS
  Administered 2019-11-23: 1.5 mg via INTRAVENOUS
  Administered 2019-11-23: 30 mg via INTRAVENOUS
  Administered 2019-11-23: 8 mg via INTRAVENOUS
  Filled 2019-11-22 (×2): qty 30

## 2019-11-22 MED ORDER — CHLORHEXIDINE GLUCONATE CLOTH 2 % EX PADS: 6.0000 | MEDICATED_PAD | Freq: Every day | CUTANEOUS | Status: DC

## 2019-11-22 MED ORDER — OXYCODONE-ACETAMINOPHEN 5-325 MG PO TABS
1.0000 | ORAL_TABLET | ORAL | Status: DC | PRN
  Administered 2019-11-22 – 2019-11-23 (×3): 1 via ORAL
  Filled 2019-11-22 (×4): qty 1

## 2019-11-22 NOTE — Progress Notes (Signed)
Subjective: Martin [name] is a 29 year old male with a medical history significant for sickle cell disease, chronic pain syndrome, opiate dependence and tolerance, history of PE on Eliquis, HIV on antiretroviral therapy, and history of anemia of chronic disease was admitted for sickle cell pain crisis.  Patient says that pain is improved some overnight.  He has been able to ambulate in room without assistance.  Pain intensity is 6/10. Patient denies headache, chest pain, urinary symptoms, nausea, or vomiting.  Patient endorses some constipation despite bowel regimen.  Objective:  Vital signs in last 24 hours:  Vitals:   11/22/19 0413 11/22/19 0445 11/22/19 0716 11/22/19 0942  BP: 103/68   105/75  Pulse: 78   68  Resp: 10 13 16 12   Temp: (!) 97.4 F (36.3 C)   97.6 F (36.4 C)  TempSrc: Oral   Oral  SpO2: 91% 95% 100% 97%  Weight:      Height:        Intake/Output from previous day:   Intake/Output Summary (Last 24 hours) at 11/22/2019 1128 Last data filed at 11/22/2019 0941 Gross per 24 hour  Intake 2314.71 ml  Output 2150 ml  Net 164.71 ml    Physical Exam: General: Alert, awake, oriented x3, in no acute distress.  HEENT: [place]/AT PEERL, EOMI Neck: Trachea midline,  no masses, no thyromegal,y no JVD, no carotid bruit OROPHARYNX:  Moist, No exudate/ erythema/lesions.  Heart: Regular rate and rhythm, without murmurs, rubs, gallops, PMI non-displaced, no heaves or thrills on palpation.  Lungs: Clear to auscultation, no wheezing or rhonchi noted. No increased vocal fremitus resonant to percussion  Abdomen: Soft, nontender, nondistended, positive bowel sounds, no masses no hepatosplenomegaly noted..  Neuro: No focal neurological deficits noted cranial nerves II through XII grossly intact. DTRs 2+ bilaterally upper and lower extremities. Strength 5 out of 5 in bilateral upper and lower extremities. Musculoskeletal: No warm swelling or erythema around joints, no spinal tenderness  noted. Psychiatric: Patient alert and oriented x3, good insight and cognition, good recent to remote recall. Lymph node survey: No cervical axillary or inguinal lymphadenopathy noted.  Lab Results:  Basic Metabolic Panel:    Component Value Date/Time   NA 142 11/20/2019 0732   NA 136 10/17/2018 1433   K 4.0 11/20/2019 0732   CL 105 11/20/2019 0732   CO2 27 11/20/2019 0732   BUN 12 11/20/2019 0732   BUN 6 10/17/2018 1433   CREATININE 0.70 11/20/2019 0732   GLUCOSE 76 11/20/2019 0732   CALCIUM 8.7 (L) 11/20/2019 0732   CBC:    Component Value Date/Time   WBC 11.7 (H) 11/22/2019 0625   HGB 8.9 (L) 11/22/2019 0625   HGB 9.6 (L) 10/17/2018 1433   HCT 21.4 (L) 11/22/2019 0625   HCT 26.2 (L) 10/17/2018 1433   PLT 487 (H) 11/22/2019 0625   PLT 297 10/17/2018 1433   MCV 109.2 (H) 11/22/2019 0625   MCV 108 (H) 10/17/2018 1433   NEUTROABS 3.1 11/20/2019 0732   NEUTROABS 7.0 10/17/2018 1433   LYMPHSABS 9.3 (H) 11/20/2019 0732   LYMPHSABS 4.6 (H) 10/17/2018 1433   MONOABS 1.5 (H) 11/20/2019 0732   EOSABS 0.2 11/20/2019 0732   EOSABS 0.2 10/17/2018 1433   BASOSABS 0.1 11/20/2019 0732   BASOSABS 0.1 10/17/2018 1433    Recent Results (from the past 240 hour(s))  SARS Coronavirus 2 by RT PCR (hospital order, performed in [facility] hospital lab) Nasopharyngeal Nasopharyngeal Swab     Status: None   Collection Time:  11/21/19  5:35 PM   Specimen: Nasopharyngeal Swab  Result Value Ref Range Status   SARS Coronavirus 2 NEGATIVE NEGATIVE Final    Comment: (NOTE) SARS-CoV-2 target nucleic acids are NOT DETECTED.  The SARS-CoV-2 RNA is generally detectable in upper and lower respiratory specimens during the acute phase of infection. The lowest concentration of SARS-CoV-2 viral copies this assay can detect is 250 copies / mL. A negative result does not preclude SARS-CoV-2 infection and should not be used as the sole basis for treatment or other patient management decisions.  A  negative result may occur with improper specimen collection / handling, submission of specimen other than nasopharyngeal swab, presence of viral mutation(s) within the areas targeted by this assay, and inadequate number of viral copies (<250 copies / mL). A negative result must be combined with clinical observations, patient history, and epidemiological information.  Fact Sheet for Patients:   BoilerBrush.com.cy  Fact Sheet for Healthcare Providers: https://pope.com/  This test is not yet approved or  cleared by the [place] FDA and has been authorized for detection and/or diagnosis of SARS-CoV-2 by FDA under an Emergency Use Authorization (EUA).  This EUA will remain in effect (meaning this test can be used) for the duration of the COVID-19 declaration under Section 564(b)(1) of the Act, 21 U.S.C. section 360bbb-3(b)(1), unless the authorization is terminated or revoked sooner.  Performed at [facility], [address] [place], [place] [address]     Studies/Results: DG Chest 2 View  Result Date: 11/21/2019 CLINICAL DATA:  Sickle cell pain crisis. EXAM: CHEST - 2 VIEW COMPARISON:  August 08, 2019 FINDINGS: Since the prior study there is been interval placement of a right-sided venous Port-A-Cath. Its distal tip is noted just beyond the junction of the superior vena cava and right atrium. The lungs are mildly hyperinflated. Mild to moderate severity diffuse chronic appearing increased interstitial lung markings are noted. There is no evidence of acute infiltrate, pleural effusion or pneumothorax. The heart size and mediastinal contours are within normal limits. The visualized skeletal structures are unremarkable. IMPRESSION: Chronic appearing increased interstitial lung markings without evidence of acute or active cardiopulmonary disease. Electronically Signed   By: Aram Candela M.D.   On: 11/21/2019 19:37     Medications: Scheduled Meds: . abacavir-dolutegravir-lamiVUDine  1 tablet Oral QHS  . apixaban  5 mg Oral BID  . HYDROmorphone   Intravenous Q4H  . hydroxyurea  2,000 mg Oral QHS  . loratadine  10 mg Oral Daily  . multivitamin with minerals  1 tablet Oral Q breakfast  . senna-docusate  1 tablet Oral BID   Continuous Infusions: . sodium chloride 100 mL/hr at 11/22/19 0826   PRN Meds:.ALPRAZolam, diphenhydrAMINE, heparin lock flush, lip balm, naloxone **AND** sodium chloride flush, ondansetron (ZOFRAN) IV, polyethylene glycol, sodium phosphate  Consultants:  None  Procedures:  None  Antibiotics:  None  Assessment/Plan: Principal Problem:   Sickle cell pain crisis (HCC) Active Problems:   Generalized anxiety disorder   HIV (human immunodeficiency virus infection) (HCC)   Other chronic pain   History of pulmonary embolus (PE)  Sickle cell disease with pain crisis: Decrease IV fluids to KVO Weaning IV Dilaudid PCA, settings decreased to 0.5 mg, 10-minute lockout, and 2 mg/h Monitor vital signs very closely, reevaluate pain scale regularly, and supplemental oxygen as needed.  Sickle cell anemia: Hemoglobin stable and consistent with patient's baseline.  There is no clinical indication for blood transfusion at this time.  Continue to follow  closely.  Chronic pain syndrome: Restart home medications.  Percocet 10-325 mg every 4 hours as needed for severe breakthrough pain  History of HIV disease: Stable.  Continue home medications.  History of PE: Continue Eliquis 5 mg twice daily  Leukocytosis: WBCs stable.  Secondary to vaso-occlusive crisis.  Chest x-ray shows no acute cardiopulmonary process.  COVID-19 test negative.  Patient is afebrile without any signs of infection or inflammation.  Continue to monitor closely without antibiotics.  Generalized anxiety disorder: Stable.  Continue home medications.  Patient denies any suicidal or homicidal intent  Code  Status: Full Code Family Communication: N/A Disposition Plan: Not yet ready for discharge    Laquia Rosano Rennis Petty  APRN, [name], [name] [facility] [address] [place], [place] [address] [phone]  If 5PM-7AM, please contact night-coverage.  11/22/2019, 11:28 AM  LOS: 1 day

## 2019-11-23 ENCOUNTER — Other Ambulatory Visit: Payer: Self-pay | Admitting: Family Medicine

## 2019-11-23 ENCOUNTER — Telehealth: Payer: Self-pay | Admitting: Family Medicine

## 2019-11-23 DIAGNOSIS — G8929 Other chronic pain: Secondary | ICD-10-CM

## 2019-11-23 DIAGNOSIS — D571 Sickle-cell disease without crisis: Secondary | ICD-10-CM

## 2019-11-23 LAB — CBC
HCT: 26.2 % — ABNORMAL LOW (ref 39.0–52.0)
Hemoglobin: 9.3 g/dL — ABNORMAL LOW (ref 13.0–17.0)
MCH: 37.7 pg — ABNORMAL HIGH (ref 26.0–34.0)
MCHC: 35.5 g/dL (ref 30.0–36.0)
MCV: 106.1 fL — ABNORMAL HIGH (ref 80.0–100.0)
Platelets: 481 10*3/uL — ABNORMAL HIGH (ref 150–400)
RBC: 2.47 MIL/uL — ABNORMAL LOW (ref 4.22–5.81)
RDW: 22.9 % — ABNORMAL HIGH (ref 11.5–15.5)
WBC: 10.1 10*3/uL (ref 4.0–10.5)
nRBC: 23.3 % — ABNORMAL HIGH (ref 0.0–0.2)

## 2019-11-23 MED ORDER — OXYCODONE-ACETAMINOPHEN 10-325 MG PO TABS: 1.0000 | ORAL_TABLET | ORAL | 0 refills | Status: DC | PRN

## 2019-11-23 MED ORDER — GUAIFENESIN-DM 100-10 MG/5ML PO SYRP
5.0000 mL | ORAL_SOLUTION | ORAL | 0 refills | Status: DC | PRN
Start: 2019-11-23 — End: 2020-02-27

## 2019-11-23 NOTE — Discharge Summary (Signed)
Physician Discharge Summary  LINDBERGH WINKLES WSF:[id number] DOB: Nov 14, 1990 DOA: 11/21/2019  PCP: [name], [name]  Admit date: 11/21/2019  Discharge date: 11/23/2019  Discharge Diagnoses:  Principal Problem:   Sickle cell pain crisis (HCC) Active Problems:   Generalized anxiety disorder   HIV (human immunodeficiency virus infection) (HCC)   Other chronic pain   History of pulmonary embolus (PE)   Discharge Condition: Stable  Disposition:  Pt is discharged home in good condition and is to follow up with [name], [name] this week to have labs evaluated. [name] is instructed to increase activity slowly and balance with rest for the next few days, and use prescribed medication to complete treatment of pain  Diet: Regular Wt Readings from Last 3 Encounters:  11/21/19 64.7 kg  11/20/19 68 kg  10/09/19 63.5 kg    History of present illness:  Martin [name] is a 29 year old male with a medical history significant for sickle cell disease, chronic pain syndrome, opiate dependence and tolerance, history of PE on Eliquis, HIV on antiretroviral therapy, and history of anemia of chronic disease who presents complaining of generalized pain that is consistent with his typical pain crisis.  Patient states that he was treated and evaluated for this problem in the emergency room on 11/19/2019 and pain improved prior to discharge.  Patient also says that he was admitted for pneumonia in [place], [place] 3 weeks prior and has completed antibiotics.  However, he continues to have persistent cough.  Also, patient states that cough is productive.  He also endorses some fatigue.  His current pain intensity is 9/10 characterized as constant and throbbing.  He last had Percocet this a.m. without sustained relief.  He denies headache, shortness of breath, paresthesias, fever, chills, urinary symptoms, nausea, vomiting, or diarrhea.  He denies any sick contacts or exposure to COVID-19  infection.  Sickle cell center course:  Patient's vital signs reported as:BP 101/73 (BP Location: Right Arm)   Pulse 78   Temp 98 F (36.7 C) (Oral)   Resp 10   Ht 6\' 3"  (1.905 m)   Wt 64.7 kg   SpO2 90%   BMI 17.84 kg/m .  WBCs 13.5, patient is afebrile.  Hemoglobin 8.6, appears to be consistent with patient's baseline.  Total bilirubin is elevated at 2.7.  Remaining labs are largely within normal limits.  COVID-19 test pending.  Patient's pain persists despite IV fluids and IV Dilaudid PCA.  Patient transition to medical floor for further management of sickle cell pain crisis.  Hospital Course:  Sickle cell disease with pain crisis: Patient was admitted for sickle cell pain crisis and managed appropriately with IVF, IV Dilaudid via PCA  as well as other adjunct therapies per sickle cell pain management protocols.  IV Dilaudid PCA was weaned appropriately.  Patient transitioned to home medications.  Advised to resume all home medications.  In 1 week to repeat CBC with differential and CMP.  Pain intensity decreased to 4/10.  Patient states that he can manage at home on current medication regimen.  All medications for chronic conditions contribute throughout admission without interruption.  Patient is afebrile saturation.  Patient is alert, oriented, and ambulating without assistance.  He is aware of all upcoming appointments.  Patient was discharged home today in a hemodynamically stable condition.   Discharge Exam: Vitals:   11/23/19 0734 11/23/19 0940  BP:  100/65  Pulse:  81  Resp: 14 14  Temp:  98.1 F (36.7 C)  SpO2: 97% 97%   Vitals:   11/23/19 0020 11/23/19 0413 11/23/19 0734 11/23/19 0940  BP: 109/72 (!) 95/59  100/65  Pulse: 79 78  81  Resp: 12 14 14 14   Temp: 98 F (36.7 C) 98.2 F (36.8 C)  98.1 F (36.7 C)  TempSrc: Oral Oral  Oral  SpO2: 93% 96% 97% 97%  Weight:      Height:        General appearance : Awake, alert, not in any distress. Speech Clear.  Not toxic looking HEENT: Atraumatic and Normocephalic, pupils equally reactive to light and accomodation Neck: Supple, no JVD. No cervical lymphadenopathy.  Chest: Good air entry bilaterally, no added sounds  CVS: S1 S2 regular, no murmurs.  Abdomen: Bowel sounds present, Non tender and not distended with no gaurding, rigidity or rebound. Extremities: B/L Lower Ext shows no edema, both legs are warm to touch Neurology: Awake alert, and oriented X 3, CN II-XII intact, Non focal Skin: No Rash  Discharge Instructions  Discharge Instructions    Discharge patient   Complete by: As directed    Discharge disposition: 01-Home or Self Care   Discharge patient date: 11/23/2019     Allergies as of 11/23/2019      Reactions   Tape Rash, Other (See Comments)   PLEASE DO NOT USE THE CLEAR, THICK, "PLASTIC" TAPE- only paper tape is tolerated    Ketorolac Swelling, Other (See Comments)   Patient reports facial edema and left arm edema after administration.      Medication List    TAKE these medications   ALPRAZolam 1 MG tablet Commonly known as: XANAX Take 1 tablet by mouth 3 times daily as needed for anxiety or sleep. What changed:   how much to take  how to take this  when to take this  reasons to take this  additional instructions   apixaban 5 MG Tabs tablet Commonly known as: Eliquis Take 1 tablet (5 mg total) by mouth 2 (two) times daily.   cetirizine 10 MG tablet Commonly known as: ZYRTEC Take 1 tablet (10 mg total) by mouth daily. What changed:   when to take this  reasons to take this   diphenhydrAMINE 25 MG tablet Commonly known as: BENADRYL Take 25 mg by mouth every 6 (six) hours as needed for allergies.   guaiFENesin-dextromethorphan 100-10 MG/5ML syrup Commonly known as: ROBITUSSIN DM Take 5 mLs by mouth every 4 (four) hours as needed for cough.   hydroxyurea 500 MG capsule Commonly known as: HYDREA Take 4 capsules (2,000 mg total) by mouth at bedtime.    multivitamin with minerals Tabs tablet Take 1 tablet by mouth daily with breakfast.   naloxone 0.4 MG/ML injection Commonly known as: NARCAN As needed.   oxyCODONE-acetaminophen 10-325 MG tablet Commonly known as: PERCOCET Take 1 tablet by mouth every 4 (four) hours as needed for pain.   Triumeq 600-50-300 MG tablet Generic drug: abacavir-dolutegravir-lamiVUDine Take 1 tablet by mouth at bedtime.   Vitamin D (Ergocalciferol) 1.25 MG (50000 UNIT) Caps capsule Commonly known as: DRISDOL Take 1 capsule (50,000 Units total) by mouth every 7 (seven) days. What changed: additional instructions       The results of significant diagnostics from this hospitalization (including imaging, microbiology, ancillary and laboratory) are listed below for reference.    Significant Diagnostic Studies: DG Chest 2 View  Result Date: 11/21/2019 CLINICAL DATA:  Sickle cell pain crisis. EXAM: CHEST - 2 VIEW COMPARISON:  August 08, 2019 FINDINGS: Since  the prior study there is been interval placement of a right-sided venous Port-A-Cath. Its distal tip is noted just beyond the junction of the superior vena cava and right atrium. The lungs are mildly hyperinflated. Mild to moderate severity diffuse chronic appearing increased interstitial lung markings are noted. There is no evidence of acute infiltrate, pleural effusion or pneumothorax. The heart size and mediastinal contours are within normal limits. The visualized skeletal structures are unremarkable. IMPRESSION: Chronic appearing increased interstitial lung markings without evidence of acute or active cardiopulmonary disease. Electronically Signed   By: Aram Candela M.D.   On: 11/21/2019 19:37    Microbiology: Recent Results (from the past 240 hour(s))  SARS Coronavirus 2 by RT PCR (hospital order, performed in [facility] hospital lab) Nasopharyngeal Nasopharyngeal Swab     Status: None   Collection Time: 11/21/19  5:35 PM   Specimen: Nasopharyngeal  Swab  Result Value Ref Range Status   SARS Coronavirus 2 NEGATIVE NEGATIVE Final    Comment: (NOTE) SARS-CoV-2 target nucleic acids are NOT DETECTED.  The SARS-CoV-2 RNA is generally detectable in upper and lower respiratory specimens during the acute phase of infection. The lowest concentration of SARS-CoV-2 viral copies this assay can detect is 250 copies / mL. A negative result does not preclude SARS-CoV-2 infection and should not be used as the sole basis for treatment or other patient management decisions.  A negative result may occur with improper specimen collection / handling, submission of specimen other than nasopharyngeal swab, presence of viral mutation(s) within the areas targeted by this assay, and inadequate number of viral copies (<250 copies / mL). A negative result must be combined with clinical observations, patient history, and epidemiological information.  Fact Sheet for Patients:   BoilerBrush.com.cy  Fact Sheet for Healthcare Providers: https://pope.com/  This test is not yet approved or  cleared by the [place] FDA and has been authorized for detection and/or diagnosis of SARS-CoV-2 by FDA under an Emergency Use Authorization (EUA).  This EUA will remain in effect (meaning this test can be used) for the duration of the COVID-19 declaration under Section 564(b)(1) of the Act, 21 U.S.C. section 360bbb-3(b)(1), unless the authorization is terminated or revoked sooner.  Performed at [facility], [address], [place], [place] [address]      Labs: Basic Metabolic Panel: Recent Labs  Lab 11/20/19 0732  NA 142  K 4.0  CL 105  CO2 27  GLUCOSE 76  BUN 12  CREATININE 0.70  CALCIUM 8.7*   Liver Function Tests: Recent Labs  Lab 11/20/19 0732  AST 25  ALT 22  ALKPHOS 49  BILITOT 2.7*  PROT 8.3*  ALBUMIN 3.5   No results for input(s): LIPASE, AMYLASE in the last 168  hours. No results for input(s): AMMONIA in the last 168 hours. CBC: Recent Labs  Lab 11/20/19 0732 11/22/19 0625 11/23/19 0610  WBC 13.5* 11.7* 10.1  NEUTROABS 3.1  --   --   HGB 8.6* 8.9* 9.3*  HCT 24.6* 21.4* 26.2*  MCV 107.4* 109.2* 106.1*  PLT 577* 487* 481*   Cardiac Enzymes: No results for input(s): CKTOTAL, CKMB, CKMBINDEX, TROPONINI in the last 168 hours. BNP: Invalid input(s): POCBNP CBG: No results for input(s): GLUCAP in the last 168 hours.  Time coordinating discharge: 35 minutes  Signed:  [name]  APRN, [name], [name] Patient Care [facility] [address] [place], [place] [address] [phone]  [facility] 11/23/2019, 11:24 AM

## 2019-11-23 NOTE — Discharge Instructions (Signed)
Sickle Cell Anemia, Adult  Sickle cell anemia is a condition where your red blood cells are shaped like sickles. Red blood cells carry oxygen through the body. Sickle-shaped cells do not live as long as normal red blood cells. They also clump together and block blood from flowing through the blood vessels. This prevents the body from getting enough oxygen. Sickle cell anemia causes organ damage and pain. It also increases the risk of infection. Follow these instructions at home: Medicines  Take over-the-counter and prescription medicines only as told by your doctor.  If you were prescribed an antibiotic medicine, take it as told by your doctor. Do not stop taking the antibiotic even if you start to feel better.  If you develop a fever, do not take medicines to lower the fever right away. Tell your doctor about the fever. Managing pain, stiffness, and swelling  Try these methods to help with pain: ? Use a heating pad. ? Take a warm bath. ? Distract yourself, such as by watching TV. Eating and drinking  Drink enough fluid to keep your pee (urine) clear or pale yellow. Drink more in hot weather and during exercise.  Limit or avoid alcohol.  Eat a healthy diet. Eat plenty of fruits, vegetables, whole grains, and lean protein.  Take vitamins and supplements as told by your doctor. Traveling  When traveling, keep these with you: ? Your medical information. ? The names of your doctors. ? Your medicines.  If you need to take an airplane, talk to your doctor first. Activity  Rest often.  Avoid exercises that make your heart beat much faster, such as jogging. General instructions  Do not use products that have nicotine or tobacco, such as cigarettes and e-cigarettes. If you need help quitting, ask your doctor.  Consider wearing a medical alert bracelet.  Avoid being in high places (high altitudes), such as mountains.  Avoid very hot or cold temperatures.  Avoid places where the  temperature changes a lot.  Keep all follow-up visits as told by your doctor. This is important. Contact a doctor if:  A joint hurts.  Your feet or hands hurt or swell.  You feel tired (fatigued). Get help right away if:  You have symptoms of infection. These include: ? Fever. ? Chills. ? Being very tired. ? Irritability. ? Poor eating. ? Throwing up (vomiting).  You feel dizzy or faint.  You have new stomach pain, especially on the left side.  You have a an erection (priapism) that lasts more than 4 hours.  You have numbness in your arms or legs.  You have a hard time moving your arms or legs.  You have trouble talking.  You have pain that does not go away when you take medicine.  You are short of breath.  You are breathing fast.  You have a long-term cough.  You have pain in your chest.  You have a bad headache.  You have a stiff neck.  Your stomach looks bloated even though you did not eat much.  Your skin is pale.  You suddenly cannot see well. Summary  Sickle cell anemia is a condition where your red blood cells are shaped like sickles.  Follow your doctor's advice on ways to manage pain, food to eat, activities to do, and steps to take for safe travel.  Get medical help right away if you have any signs of infection, such as a fever. This information is not intended to replace advice given to you by   your health care provider. Make sure you discuss any questions you have with your health care provider. Document Revised: 07/29/2018 Document Reviewed: 05/12/2016 Elsevier Patient Education  2020 Elsevier Inc.  

## 2019-11-23 NOTE — Progress Notes (Signed)
Patient discharged to home, discharge instructions reviewed with patient who verbalized understanding.  

## 2019-11-24 NOTE — Telephone Encounter (Signed)
Done

## 2019-11-27 ENCOUNTER — Encounter: Payer: Self-pay | Admitting: Family Medicine

## 2019-11-27 ENCOUNTER — Ambulatory Visit (INDEPENDENT_AMBULATORY_CARE_PROVIDER_SITE_OTHER): Payer: Medicaid Other | Admitting: Family Medicine

## 2019-11-27 ENCOUNTER — Other Ambulatory Visit: Payer: Self-pay

## 2019-11-27 VITALS — BP 107/64 | HR 92 | Temp 98.7°F | Resp 14 | Ht 75.0 in | Wt 145.0 lb

## 2019-11-27 DIAGNOSIS — F1190 Opioid use, unspecified, uncomplicated: Secondary | ICD-10-CM

## 2019-11-27 DIAGNOSIS — R809 Proteinuria, unspecified: Secondary | ICD-10-CM | POA: Insufficient documentation

## 2019-11-27 DIAGNOSIS — G8929 Other chronic pain: Secondary | ICD-10-CM

## 2019-11-27 DIAGNOSIS — Z09 Encounter for follow-up examination after completed treatment for conditions other than malignant neoplasm: Secondary | ICD-10-CM | POA: Diagnosis not present

## 2019-11-27 DIAGNOSIS — D571 Sickle-cell disease without crisis: Secondary | ICD-10-CM

## 2019-11-27 DIAGNOSIS — F419 Anxiety disorder, unspecified: Secondary | ICD-10-CM | POA: Insufficient documentation

## 2019-11-27 LAB — POCT URINALYSIS DIPSTICK
Bilirubin, UA: NEGATIVE
Blood, UA: NEGATIVE
Glucose, UA: NEGATIVE
Ketones, UA: NEGATIVE
Leukocytes, UA: NEGATIVE
Nitrite, UA: NEGATIVE
Protein, UA: NEGATIVE
Spec Grav, UA: 1.015 (ref 1.010–1.025)
Urobilinogen, UA: >=8 E.U./dL — AB
pH, UA: 7 (ref 5.0–8.0)

## 2019-11-27 NOTE — Progress Notes (Signed)
[facility] Medicine [facility] Sickle Cell Care   Hospital Follow Up  Subjective:  Patient ID: Martin [name], male    DOB: 1991-01-28  Age: 29 y.o. MRN: [id number]  CC:  Chief Complaint  Patient presents with  . Sickle Cell Anemia    HPI Martin [name] is a 29 year old male who presents for Hospital Follow Up today.   Patient Active Problem List   Diagnosis Date Noted  . Scleral icterus   . Chronic, continuous use of opioids 08/15/2019  . Seasonal allergies 08/15/2019  . Hypoxia   . Chest congestion   . Sickle cell anemia with crisis (HCC) 08/04/2019  . Other chronic pain 08/04/2019  . History of pulmonary embolus (PE) 08/04/2019  . Acute pulmonary embolism (HCC) 02/10/2019  . Acute chest syndrome due to sickle cell crisis (HCC) 02/10/2019  . Sickle cell crisis (HCC) 01/17/2019  . Heart murmur 02/03/2017  . Sickle cell disease with crisis (HCC) 02/02/2017  . 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 Sickle cell pain crisis (HCC) 02/02/2017  . 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HCC) 12/20/2016  . Vitamin D deficiency 08/14/2016  . High risk medication use 09/27/2014  . Generalized anxiety disorder 05/19/2014  . GERD (gastroesophageal reflux disease) 05/19/2014  . HIV (human immunodeficiency virus infection) (HCC) 05/23/2012    Past Medical History:  Diagnosis Date  . Anxiety   . HIV (human immunodeficiency virus infection) (HCC)   . Proteinuria   . Sickle cell crisis (HCC)   . 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HCC)   . Vitamin D deficiency 10/2018    Past Surgical History:  Procedure Laterality Date  . IR IMAGING GUIDED PORT INSERTION  08/29/2019    Family History  Problem Relation Age of Onset  . Sickle cell trait Mother   . Sickle cell trait Father   . Birth defects Maternal Grandmother   . Birth defects Paternal Grandmother    Current Status: Since his last office visit, he is doing well with no complaints. He states that he has pain in his lower back  pain. He rates his pain today at 3-4/10. He has not had a hospital visit for Sickle Cell Crisis since 11/21/2019 where he was treated and discharged on 11/23/2019, and previously before then he was hospitalized from 11/07/2019-11/16/2019. He is currently taking all medications as prescribed and staying well hydrated. He reports occasional nausea, constipation, dizziness and headaches. His anxiety is stable today. He continues to follow up with Infection Disease as needed. He completed series of Coronavirus Vaccine on 10/17/2019. He denies fevers, chills, recent infections, weight loss, and night sweats. He has not had any visual changes, and falls. No chest pain, heart palpitations, cough and shortness of breath reported. Denies GI problems such as vomiting, and diarrhea. He has no reports of blood in stools, dysuria and hematuria. He is taking all medications as prescribed.   Social History   Socioeconomic History  . Marital status: Single    Spouse name: Not on file  . Number of children: Not on file  . Years of education: Not on file  . Highest education level: Not on file  Occupational History  . Not on file  Tobacco Use  . Smoking status: Never Smoker  . Smokeless tobacco: Never Used  Vaping Use  . Vaping Use: Some days  . Substances: THC  Substance and Sexual Activity  . Alcohol use: No  . Drug use: Yes    Types: Marijuana  . Sexual activity: Yes  Birth control/protection: Condom  Other Topics Concern  . Not on file  Social History Narrative  . Not on file   Social Determinants of Health   Financial Resource Strain:   . Difficulty of Paying Living Expenses:   Food Insecurity:   . Worried About Programme researcher, broadcasting/film/videounning Out of Food in the Last Year:   . Baristaan Out of Food in the Last Year:   Transportation Needs:   . Freight forwarderLack of Transportation (Medical):   Marland Kitchen. Lack of Transportation (Non-Medical):   Physical Activity:   . Days of Exercise per Week:   . Minutes of Exercise per Session:   Stress:   .  Feeling of Stress :   Social Connections:   . Frequency of Communication with Friends and Family:   . Frequency of Social Gatherings with Friends and Family:   . Attends Religious Services:   . Active Member of Clubs or Organizations:   . Attends BankerClub or Organization Meetings:   Marland Kitchen. Marital Status:   Intimate Partner Violence:   . Fear of Current or Ex-Partner:   . Emotionally Abused:   Marland Kitchen. Physically Abused:   . Sexually Abused:     Outpatient Medications Prior to Visit  Medication Sig Dispense Refill  . abacavir-dolutegravir-lamiVUDine (TRIUMEQ) 600-50-300 MG tablet Take 1 tablet by mouth at bedtime.     Marland Kitchen. apixaban (ELIQUIS) 5 MG TABS tablet Take 1 tablet (5 mg total) by mouth 2 (two) times daily. 60 tablet 5  . cetirizine (ZYRTEC) 10 MG tablet Take 1 tablet (10 mg total) by mouth daily. (Patient taking differently: Take 10 mg by mouth daily as needed for allergies. ) 30 tablet 11  . diphenhydrAMINE (BENADRYL) 25 MG tablet Take 25 mg by mouth every 6 (six) hours as needed for allergies.    Marland Kitchen. guaiFENesin-dextromethorphan (ROBITUSSIN DM) 100-10 MG/5ML syrup Take 5 mLs by mouth every 4 (four) hours as needed for cough. 118 mL 0  . hydroxyurea (HYDREA) 500 MG capsule Take 4 capsules (2,000 mg total) by mouth at bedtime. 120 capsule 3  . Multiple Vitamin (MULTIVITAMIN WITH MINERALS) TABS tablet Take 1 tablet by mouth daily with breakfast.     . naloxone (NARCAN) 0.4 MG/ML injection As needed. 1 mL 2  . oxyCODONE-acetaminophen (PERCOCET) 10-325 MG tablet Take 1 tablet by mouth every 4 (four) hours as needed for pain. 90 tablet 0  . Vitamin D, Ergocalciferol, (DRISDOL) 1.25 MG (50000 UT) CAPS capsule Take 1 capsule (50,000 Units total) by mouth every 7 (seven) days. (Patient taking differently: Take 50,000 Units by mouth every 7 (seven) days. Every Monday) 5 capsule 3   No facility-administered medications prior to visit.    Allergies  Allergen Reactions  . Tape Rash and Other (See Comments)      PLEASE DO NOT USE THE CLEAR, THICK, "PLASTIC" TAPE- only paper tape is tolerated   . Ketorolac Swelling and Other (See Comments)    Patient reports facial edema and left arm edema after administration.    ROS Review of Systems  Constitutional: Negative.   HENT: Negative.   Eyes: Negative.   Respiratory: Positive for shortness of breath (occasional ).   Cardiovascular: Negative.   Gastrointestinal: Negative.   Endocrine: Negative.   Genitourinary: Negative.   Musculoskeletal: Positive for arthralgias (generalized joint pain).  Skin: Negative.   Allergic/Immunologic: Negative.   Neurological: Positive for dizziness (occasional ) and headaches (occasional ).  Hematological: Negative.   Psychiatric/Behavioral: Negative.       Objective:  Physical Exam Vitals and nursing note reviewed.  Constitutional:      Appearance: Normal appearance.  HENT:     Head: Normocephalic and atraumatic.     Nose: Nose normal.     Mouth/Throat:     Mouth: Mucous membranes are moist.     Pharynx: Oropharynx is clear.  Cardiovascular:     Rate and Rhythm: Normal rate and regular rhythm.     Pulses: Normal pulses.     Heart sounds: Normal heart sounds.  Pulmonary:     Effort: Pulmonary effort is normal.     Breath sounds: Normal breath sounds.  Abdominal:     General: Abdomen is flat. Bowel sounds are normal.     Palpations: Abdomen is soft.  Musculoskeletal:        General: Normal range of motion.     Cervical back: Normal range of motion and neck supple.  Skin:    General: Skin is warm and dry.  Neurological:     General: No focal deficit present.     Mental Status: He is alert and oriented to person, place, and time.  Psychiatric:        Mood and Affect: Mood normal.        Behavior: Behavior normal.        Thought Content: Thought content normal.        Judgment: Judgment normal.     BP 107/64 (BP Location: Right Arm, Patient Position: Sitting, Cuff Size: Normal)   Pulse  92   Temp 98.7 F (37.1 C) (Oral)   Resp 14   Ht 6\' 3"  (1.905 m)   Wt 145 lb (65.8 kg)   SpO2 93%   BMI 18.12 kg/m  Wt Readings from Last 3 Encounters:  11/27/19 145 lb (65.8 kg)  11/21/19 142 lb 11.2 oz (64.7 kg)  11/20/19 150 lb (68 kg)     Health Maintenance Due  Topic Date Due  . COVID-19 Vaccine (2 - Moderna 2-dose series) 11/14/2019  . INFLUENZA VACCINE  11/19/2019    There are no preventive care reminders to display for this patient.  No results found for: TSH Lab Results  Component Value Date   WBC 10.1 11/23/2019   HGB 9.3 (L) 11/23/2019   HCT 26.2 (L) 11/23/2019   MCV 106.1 (H) 11/23/2019   PLT 481 (H) 11/23/2019   Lab Results  Component Value Date   NA 142 11/20/2019   K 4.0 11/20/2019   CO2 27 11/20/2019   GLUCOSE 76 11/20/2019   BUN 12 11/20/2019   CREATININE 0.70 11/20/2019   BILITOT 2.7 (H) 11/20/2019   ALKPHOS 49 11/20/2019   AST 25 11/20/2019   ALT 22 11/20/2019   PROT 8.3 (H) 11/20/2019   ALBUMIN 3.5 11/20/2019   CALCIUM 8.7 (L) 11/20/2019   ANIONGAP 10 11/20/2019   No results found for: CHOL No results found for: HDL No results found for: LDLCALC No results found for: [name] No results found for: CHOLHDL Lab Results  Component Value Date   HGBA1C (L) 05/22/2009    <4.0 (NOTE) The ADA recommends the following therapeutic goal for glycemic control related to Hgb A1c measurement: Goal of therapy: <6.5 Hgb A1c  Reference: American Diabetes Association: Clinical Practice Recommendations 2010, Diabetes Care, 2010, 33: (Suppl  1).      Assessment & Plan:   1. Hospital discharge follow-up  2. Hb-SS disease without crisis [facility]) He is doing well today r/t his chronic pain management.  He will continue  to take pain medications as prescribed; will continue to avoid extreme heat and cold; will continue to eat a healthy diet and drink at least 64 ounces of water daily; continue stool softener as needed; will avoid colds and flu; will continue to  get plenty of sleep and rest; will continue to avoid high stressful situations and remain infection free; will continue Folic Acid 1 mg daily to avoid sickle cell crisis. Continue to follow up with Hematologist as needed.  - Urinalysis Dipstick  3. Chronic, continuous use of opioids - [id number] 11+Oxyco+Alc+Crt-Bund  4. Other chronic pain  5. Anxiety  6. Proteinuria, unspecified type - Microalbumin/Creatinine Ratio, Urine  7. Follow up He will follow up in 2 months.  No orders of the defined types were placed in this encounter.   Orders Placed This Encounter  Procedures  . Microalbumin/Creatinine Ratio, Urine  . [id number] 11+Oxyco+Alc+Crt-Bund  . Urinalysis Dipstick    Referral Orders  No referral(s) requested today    [name],  [name], [name]-BC [facility] Patient Care Center/Internal Medicine/Sickle Cell Center [facility] [address] [place], [place] [address] [phone] fax   Problem List Items Addressed This Visit      Other   Chronic, continuous use of opioids   Relevant Orders   [phone] 11+Oxyco+Alc+Crt-Bund   Other chronic pain   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HCC)   Relevant Orders   Urinalysis Dipstick (Completed)    Other Visit Diagnoses    Hospital discharge follow-up    -  Primary   Anxiety       Proteinuria, unspecified type       Relevant Orders   Microalbumin/Creatinine Ratio, Urine   Follow up          No orders of the defined types were placed in this encounter.   Follow-up: Return in about 2 months (around 01/27/2020).    [name], [name]

## 2019-11-28 LAB — MICROALBUMIN / CREATININE URINE RATIO
Creatinine, Urine: 57.9 mg/dL
Microalb/Creat Ratio: 15 mg/g creat (ref 0–29)
Microalbumin, Urine: 8.7 ug/mL

## 2019-12-04 ENCOUNTER — Emergency Department (HOSPITAL_COMMUNITY)
Admission: EM | Admit: 2019-12-04 | Discharge: 2019-12-04 | Disposition: A | Discharge status: Home / Self Care | Payer: Medicaid Other | Attending: Emergency Medicine | Admitting: Emergency Medicine

## 2019-12-04 ENCOUNTER — Encounter (HOSPITAL_COMMUNITY): Payer: Self-pay

## 2019-12-04 ENCOUNTER — Non-Acute Institutional Stay (HOSPITAL_BASED_OUTPATIENT_CLINIC_OR_DEPARTMENT_OTHER)
Admission: AD | Admit: 2019-12-04 | Discharge: 2019-12-04 | Disposition: A | Discharge status: Home / Self Care | Payer: Medicaid Other | Source: Ambulatory Visit | Attending: Internal Medicine | Admitting: Internal Medicine

## 2019-12-04 DIAGNOSIS — D57219 Sickle-cell/Hb-C disease with crisis, unspecified: Secondary | ICD-10-CM | POA: Diagnosis not present

## 2019-12-04 DIAGNOSIS — F1590 Other stimulant use, unspecified, uncomplicated: Secondary | ICD-10-CM | POA: Insufficient documentation

## 2019-12-04 DIAGNOSIS — Z7901 Long term (current) use of anticoagulants: Secondary | ICD-10-CM | POA: Insufficient documentation

## 2019-12-04 DIAGNOSIS — R52 Pain, unspecified: Secondary | ICD-10-CM | POA: Insufficient documentation

## 2019-12-04 DIAGNOSIS — Z21 Asymptomatic human immunodeficiency virus [HIV] infection status: Secondary | ICD-10-CM | POA: Insufficient documentation

## 2019-12-04 DIAGNOSIS — Z79899 Other long term (current) drug therapy: Secondary | ICD-10-CM | POA: Insufficient documentation

## 2019-12-04 DIAGNOSIS — E559 Vitamin D deficiency, unspecified: Secondary | ICD-10-CM | POA: Insufficient documentation

## 2019-12-04 DIAGNOSIS — D5700 Hb-SS disease with crisis, unspecified: Secondary | ICD-10-CM | POA: Diagnosis present

## 2019-12-04 LAB — CBC WITH DIFFERENTIAL/PLATELET
Abs Immature Granulocytes: 0.06 10*3/uL (ref 0.00–0.07)
Basophils Absolute: 0.1 10*3/uL (ref 0.0–0.1)
Basophils Relative: 0 %
Eosinophils Absolute: 0.2 10*3/uL (ref 0.0–0.5)
Eosinophils Relative: 1 %
HCT: 22.2 % — ABNORMAL LOW (ref 39.0–52.0)
Hemoglobin: 8 g/dL — ABNORMAL LOW (ref 13.0–17.0)
Immature Granulocytes: 0 %
Lymphocytes Relative: 44 %
Lymphs Abs: 6.1 10*3/uL — ABNORMAL HIGH (ref 0.7–4.0)
MCH: 38.3 pg — ABNORMAL HIGH (ref 26.0–34.0)
MCHC: 36 g/dL (ref 30.0–36.0)
MCV: 106.2 fL — ABNORMAL HIGH (ref 80.0–100.0)
Monocytes Absolute: 1.3 10*3/uL — ABNORMAL HIGH (ref 0.1–1.0)
Monocytes Relative: 9 %
Neutro Abs: 6.3 10*3/uL (ref 1.7–7.7)
Neutrophils Relative %: 46 %
Platelets: 272 10*3/uL (ref 150–400)
RBC: 2.09 MIL/uL — ABNORMAL LOW (ref 4.22–5.81)
RDW: 26.1 % — ABNORMAL HIGH (ref 11.5–15.5)
WBC: 14 10*3/uL — ABNORMAL HIGH (ref 4.0–10.5)
nRBC: 3.9 % — ABNORMAL HIGH (ref 0.0–0.2)

## 2019-12-04 LAB — COMPREHENSIVE METABOLIC PANEL
ALT: 30 U/L (ref 0–44)
AST: 38 U/L (ref 15–41)
Albumin: 3.8 g/dL (ref 3.5–5.0)
Alkaline Phosphatase: 57 U/L (ref 38–126)
Anion gap: 7 (ref 5–15)
BUN: 13 mg/dL (ref 6–20)
CO2: 27 mmol/L (ref 22–32)
Calcium: 9 mg/dL (ref 8.9–10.3)
Chloride: 106 mmol/L (ref 98–111)
Creatinine, Ser: 0.73 mg/dL (ref 0.61–1.24)
GFR calc Af Amer: >60 mL/min (ref >60)
GFR calc non Af Amer: >60 mL/min (ref >60)
Glucose, Bld: 102 mg/dL — ABNORMAL HIGH (ref 70–99)
Potassium: 4.1 mmol/L (ref 3.5–5.1)
Sodium: 140 mmol/L (ref 135–145)
Total Bilirubin: 5.6 mg/dL — ABNORMAL HIGH (ref 0.3–1.2)
Total Protein: 8 g/dL (ref 6.5–8.1)

## 2019-12-04 LAB — RETICULOCYTES
Immature Retic Fract: 14.1 % (ref 2.3–15.9)
RBC.: 2.1 MIL/uL — ABNORMAL LOW (ref 4.22–5.81)
Retic Count, Absolute: 502.5 10*3/uL — ABNORMAL HIGH (ref 19.0–186.0)
Retic Ct Pct: 23.9 % — ABNORMAL HIGH (ref 0.4–3.1)

## 2019-12-04 MED ORDER — HEPARIN SOD (PORK) LOCK FLUSH 100 UNIT/ML IV SOLN
500.0000 [IU] | INTRAVENOUS | Status: AC | PRN
  Administered 2019-12-04: 500 [IU]

## 2019-12-04 MED ORDER — POLYETHYLENE GLYCOL 3350 17 G PO PACK: 17.0000 g | PACK | Freq: Every day | ORAL | Status: DC | PRN

## 2019-12-04 MED ORDER — SODIUM CHLORIDE 0.9% FLUSH
10.0000 mL | INTRAVENOUS | Status: AC | PRN
  Administered 2019-12-04: 10 mL

## 2019-12-04 MED ORDER — ONDANSETRON HCL 4 MG/2ML IJ SOLN
4.0000 mg | Freq: Once | INTRAMUSCULAR | Status: AC
  Administered 2019-12-04: 4 mg via INTRAVENOUS
  Filled 2019-12-04: qty 2

## 2019-12-04 MED ORDER — HYDROMORPHONE 1 MG/ML IV SOLN
INTRAVENOUS | Status: DC
  Administered 2019-12-04: 14.5 mg via INTRAVENOUS
  Administered 2019-12-04: 30 mg via INTRAVENOUS
  Filled 2019-12-04: qty 30

## 2019-12-04 MED ORDER — DIPHENHYDRAMINE HCL 25 MG PO CAPS: 25.0000 mg | ORAL_CAPSULE | ORAL | Status: DC | PRN

## 2019-12-04 MED ORDER — SODIUM CHLORIDE 0.9 % IV SOLN
25.0000 mg | INTRAVENOUS | Status: DC | PRN
  Filled 2019-12-04: qty 0.5

## 2019-12-04 MED ORDER — HYDROMORPHONE HCL 1 MG/ML IJ SOLN
1.0000 mg | Freq: Once | INTRAMUSCULAR | Status: AC
  Administered 2019-12-04: 1 mg via INTRAVENOUS
  Filled 2019-12-04: qty 1

## 2019-12-04 MED ORDER — SODIUM CHLORIDE 0.9% FLUSH: 9.0000 mL | INTRAVENOUS | Status: DC | PRN

## 2019-12-04 MED ORDER — NALOXONE HCL 0.4 MG/ML IJ SOLN: 0.4000 mg | INTRAMUSCULAR | Status: DC | PRN

## 2019-12-04 MED ORDER — ACETAMINOPHEN 500 MG PO TABS
1000.0000 mg | ORAL_TABLET | Freq: Once | ORAL | Status: AC
  Administered 2019-12-04: 1000 mg via ORAL
  Filled 2019-12-04: qty 2

## 2019-12-04 MED ORDER — DIPHENHYDRAMINE HCL 50 MG/ML IJ SOLN
25.0000 mg | Freq: Once | INTRAMUSCULAR | Status: AC
  Administered 2019-12-04: 25 mg via INTRAVENOUS
  Filled 2019-12-04: qty 1

## 2019-12-04 MED ORDER — SENNOSIDES-DOCUSATE SODIUM 8.6-50 MG PO TABS: 1.0000 | ORAL_TABLET | Freq: Two times a day (BID) | ORAL | Status: DC

## 2019-12-04 MED ORDER — SODIUM CHLORIDE 0.45 % IV SOLN: INTRAVENOUS | Status: DC

## 2019-12-04 MED ORDER — ONDANSETRON HCL 4 MG/2ML IJ SOLN: 4.0000 mg | Freq: Four times a day (QID) | INTRAMUSCULAR | Status: DC | PRN

## 2019-12-04 NOTE — Progress Notes (Signed)
Patient admitted to the day infusion hospital from [facility] for sickle cell pain crisis. Initially, patient reported generalized pain rated 8/10. For pain management, patient placed on Dilaudid PCA, given 1000 mg Tylenol and hydrated with IV fluids. At discharge, patient rated pain at 5/10. Vital signs stable. Discharge instructions given. Patient alert, oriented and ambulatory at discharge.

## 2019-12-04 NOTE — ED Provider Notes (Signed)
[name]-EMERGENCY DEPT Provider Note   CSN: [id number] Arrival date & time: 12/04/19  0535     History Chief Complaint  Patient presents with  . Sickle Cell Pain Crisis    Martin [name] is a 29 y.o. male.  Patient is a 29 year old male with a history of sickle cell disease, PE on Eliquis, HIV on retroviral therapy and chronic anemia who is presenting today with complaint of sickle cell crisis pain that started last night.  He describes the pain as an his arms legs and back which is typical of his recurrent sickle cell crisis.  He denies any chest pain, cough, shortness of breath or fever.  He has no abdominal pain, vomiting or diarrhea.  He did take his Percocet 10 at home with minimal relief.  Pain is currently a 9 out of 10.  He had been diagnosed with pneumonia at the beginning of the month while in [place] and completed a full course of antibiotics but reports he is doing fine now.  He reports his last transfusion was approximately 2 to 3 months ago.  The history is provided by the patient.  Sickle Cell Pain Crisis Context: stress   Context: not dehydration, not infection and not non-compliance   Relieved by:  Nothing Worsened by:  Activity Ineffective treatments:  Prescription drugs Associated symptoms: no chest pain, no congestion, no cough and no fever   Risk factors: frequent admissions for pain and frequent pain crises        Past Medical History:  Diagnosis Date  . Anxiety   . HIV (human immunodeficiency virus infection) (HCC)   . Proteinuria   . Sickle cell crisis (HCC)   . 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HCC)   . Vitamin D deficiency 10/2018    Patient Active Problem List   Diagnosis Date Noted  . Anxiety 11/27/2019  . Proteinuria 11/27/2019  . Scleral icterus   . Chronic, continuous use of opioids 08/15/2019  . Seasonal allergies 08/15/2019  . Hypoxia   . Chest congestion   . Sickle cell anemia with crisis (HCC) 08/04/2019  . Other  chronic pain 08/04/2019  . History of pulmonary embolus (PE) 08/04/2019  . Acute pulmonary embolism (HCC) 02/10/2019  . Acute chest syndrome due to sickle cell crisis (HCC) 02/10/2019  . Sickle cell crisis (HCC) 01/17/2019  . Heart murmur 02/03/2017  . Sickle cell disease with crisis (HCC) 02/02/2017  . 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 Sickle cell pain crisis (HCC) 02/02/2017  . 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HCC) 12/20/2016  . Vitamin D deficiency 08/14/2016  . High risk medication use 09/27/2014  . Generalized anxiety disorder 05/19/2014  . GERD (gastroesophageal reflux disease) 05/19/2014  . HIV (human immunodeficiency virus infection) (HCC) 05/23/2012    Past Surgical History:  Procedure Laterality Date  . IR IMAGING GUIDED PORT INSERTION  08/29/2019       Family History  Problem Relation Age of Onset  . Sickle cell trait Mother   . Sickle cell trait Father   . Birth defects Maternal Grandmother   . Birth defects Paternal Grandmother     Social History   Tobacco Use  . Smoking status: Never Smoker  . Smokeless tobacco: Never Used  Vaping Use  . Vaping Use: Some days  . Substances: THC  Substance Use Topics  . Alcohol use: No  . Drug use: Yes    Types: Marijuana    Home Medications Prior to Admission medications   Medication Sig Start Date End  Date Taking? Authorizing Provider  abacavir-dolutegravir-lamiVUDine (TRIUMEQ) 600-50-300 MG tablet Take 1 tablet by mouth at bedtime.    Yes [provider]  apixaban (ELIQUIS) 5 MG TABS tablet Take 1 tablet (5 mg total) by mouth 2 (two) times daily. 08/21/19  Yes Massie MaroonHollis, Lachina M, [name]  cetirizine (ZYRTEC) 10 MG tablet Take 1 tablet (10 mg total) by mouth daily. Patient taking differently: Take 10 mg by mouth daily as needed for allergies.  08/04/19  Yes Kallie LocksStroud, Natalie M, [name]  guaiFENesin-dextromethorphan (ROBITUSSIN DM) 100-10 MG/5ML syrup Take 5 mLs by mouth every 4 (four) hours as needed for cough. 11/23/19  Yes  Massie MaroonHollis, Lachina M, [name]  hydroxyurea (HYDREA) 500 MG capsule Take 4 capsules (2,000 mg total) by mouth at bedtime. 05/26/19  Yes Kallie LocksStroud, Natalie M, [name]  Multiple Vitamin (MULTIVITAMIN WITH MINERALS) TABS tablet Take 1 tablet by mouth daily with breakfast.    Yes [provider]  oxyCODONE-acetaminophen (PERCOCET) 10-325 MG tablet Take 1 tablet by mouth every 4 (four) hours as needed for pain. 11/23/19  Yes Kallie LocksStroud, Natalie M, [name]  Vitamin D, Ergocalciferol, (DRISDOL) 1.25 MG (50000 UT) CAPS capsule Take 1 capsule (50,000 Units total) by mouth every 7 (seven) days. Patient taking differently: Take 50,000 Units by mouth every 7 (seven) days. Every Monday 11/01/18  Yes Kallie LocksStroud, Natalie M, [name]  naloxone [facility](NARCAN) 0.4 MG/ML injection As needed. 11/28/18   Kallie LocksStroud, Natalie M, [name]    Allergies    Tape and Ketorolac  Review of Systems   Review of Systems  Constitutional: Negative for fever.  HENT: Negative for congestion.   Respiratory: Negative for cough.   Cardiovascular: Negative for chest pain.  All other systems reviewed and are negative.   Physical Exam Updated Vital Signs BP 109/70 (BP Location: Left Arm)   Pulse 87   Temp 98.4 F (36.9 C) (Oral)   Resp 16   SpO2 91%   Physical Exam Vitals and nursing note reviewed.  Constitutional:      General: He is not in acute distress.    Appearance: Normal appearance. He is well-developed and normal weight.  HENT:     Head: Normocephalic and atraumatic.     Nose: Nose normal.     Mouth/Throat:     Mouth: Mucous membranes are moist.  Eyes:     Conjunctiva/sclera: Conjunctivae normal.     Pupils: Pupils are equal, round, and reactive to light.  Cardiovascular:     Rate and Rhythm: Normal rate and regular rhythm.     Heart sounds: No murmur heard.   Pulmonary:     Effort: Pulmonary effort is normal. No respiratory distress.     Breath sounds: Normal breath sounds. No wheezing or rales.  Abdominal:     General: There is no  distension.     Palpations: Abdomen is soft.     Tenderness: There is no abdominal tenderness. There is no guarding or rebound.  Musculoskeletal:        General: Tenderness present. Normal range of motion.     Cervical back: Normal range of motion and neck supple.     Comments: Tenderness of the arms/legs without joint swelling or erythema  Skin:    General: Skin is warm and dry.     Findings: No erythema or rash.  Neurological:     General: No focal deficit present.     Mental Status: He is alert and oriented to person, place, and time. Mental status is at baseline.  Psychiatric:  Mood and Affect: Mood normal.        Behavior: Behavior normal.        Thought Content: Thought content normal.     ED Results / Procedures / Treatments   Labs (all labs ordered are listed, but only abnormal results are displayed) Labs Reviewed  COMPREHENSIVE METABOLIC PANEL - Abnormal; Notable for the following components:      Result Value   Glucose, Bld 102 (*)    Total Bilirubin 5.6 (*)    All other components within normal limits  CBC WITH DIFFERENTIAL/PLATELET - Abnormal; Notable for the following components:   WBC 14.0 (*)    RBC 2.09 (*)    Hemoglobin 8.0 (*)    HCT 22.2 (*)    MCV 106.2 (*)    MCH 38.3 (*)    RDW 26.1 (*)    nRBC 3.9 (*)    All other components within normal limits  RETICULOCYTES - Abnormal; Notable for the following components:   Retic Ct Pct 23.9 (*)    RBC. 2.10 (*)    Retic Count, Absolute 502.5 (*)    All other components within normal limits    EKG None  Radiology No results found.  Procedures Procedures (including critical care time)  Medications Ordered in ED Medications  HYDROmorphone (DILAUDID) injection 1 mg (has no administration in time range)  diphenhydrAMINE (BENADRYL) injection 25 mg (has no administration in time range)  ondansetron (ZOFRAN) injection 4 mg (has no administration in time range)    ED Course  I have reviewed the  triage vital signs and the nursing notes.  Pertinent labs & imaging results that were available during my care of the patient were reviewed by me and considered in my medical decision making (see chart for details).    MDM Rules/Calculators/A&P                          Patient is a 29 year old male with a history of sickle cell disease presenting today with complaint of pain crisis.  Patient denies any infectious symptoms and no findings concerning for septic joint.  Sats are 91% on room air and he denies any shortness of breath with low suspicion for acute chest/PNA.  Last transfusion was 2 months ago.  We will recheck labs and give pain control.  9:07 AM Labs show a hemoglobin of 8, white count of 14 with normal CMP other than elevated total bilirubin of 5.6.  Reticulocyte count of 23.  Findings seem consistent with patient's sickle cell crisis.  No complicating features at this time.  On repeat exam patient is still having 8 out of 10 pain and was given a second dose of Dilaudid.  Spoke with Dr. Hyman Hopes who is excepted the patient to day hospital.  MDM Number of Diagnoses or Management Options   Amount and/or Complexity of Data Reviewed Clinical lab tests: ordered and reviewed Decide to obtain previous medical records or to obtain history from someone other than the patient: yes Obtain history from someone other than the patient: no Review and summarize past medical records: yes Discuss the patient with other providers: yes Independent visualization of images, tracings, or specimens: yes  Risk of Complications, Morbidity, and/or Mortality Presenting problems: moderate Diagnostic procedures: low Management options: low  Patient Progress Patient progress: stable    Final Clinical Impression(s) / ED Diagnoses Final diagnoses:  Sickle cell pain crisis (HCC)    Rx / DC Orders ED Discharge  Orders    None       Gwyneth Sprout, MD 12/04/19 [phone]

## 2019-12-04 NOTE — H&P (Signed)
[facility] History and Physical  Shlomie KELDRICK POMPLUN [id number]:[id number] DOB: [birth year] DOA: 12/04/2019  PCP: [name], [name]   Chief Complaint: Sickle cell pain  HPI: Martin [name] is a 29 y.o. male with history of sickle cell disease, chronic pain syndrome, opiate tolerance and dependence, history of PE on Eliquis, HIV on antiretroviral therapy and anemia of chronic disease who was transitioned to the day hospital from the emergency room this morning with major complaints of generalized body pain but mostly in his lower back and lower extremity, consistent with his typical sickle cell pain crisis.  Patient stated this pain started last night and had continued this morning despite taking his home pain medication.  He described the pain as constant and throbbing, rated at 9/10.  He denies any headache, shortness of breath, cough, chest pain, fever, chills, nausea, vomiting or diarrhea.  He denies any urinary symptom, denies any sick contact or exposure to COVID-19 infections.    In the emergency room, patient was found to be afebrile, saturating 91% on room air, with no significant physical finding suggestive of any acute disease.  His laboratory investigation showed hemoglobin of 8, white cell count of 14 with normal CMP and slightly elevated total bilirubin which is consistent with his baseline. Patient was given 2 doses of Dilaudid IV and transitioned to the day hospital for extended observation and pain management.  Systemic Review: General: The patient denies anorexia, fever, weight loss Cardiac: Denies chest pain, syncope, palpitations, pedal edema  Respiratory: Denies cough, shortness of breath, wheezing GI: Denies severe indigestion/heartburn, abdominal pain, nausea, vomiting, diarrhea and constipation GU: Denies hematuria, incontinence, dysuria  Musculoskeletal: Denies arthritis  Skin: Denies suspicious skin lesions Neurologic: Denies focal weakness or numbness,  change in vision  Past Medical History:  Diagnosis Date  . Anxiety   . HIV (human immunodeficiency virus infection) (HCC)   . Proteinuria   . Sickle cell crisis (HCC)   . 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HCC)   . Vitamin D deficiency 10/2018    Past Surgical History:  Procedure Laterality Date  . IR IMAGING GUIDED PORT INSERTION  08/29/2019    Allergies  Allergen Reactions  . Tape Rash and Other (See Comments)    PLEASE DO NOT USE THE CLEAR, THICK, "PLASTIC" TAPE- only paper tape is tolerated   . Ketorolac Swelling and Other (See Comments)    Patient reports facial edema and left arm edema after administration.    Family History  Problem Relation Age of Onset  . Sickle cell trait Mother   . Sickle cell trait Father   . Birth defects Maternal Grandmother   . Birth defects Paternal Grandmother       Prior to Admission medications   Medication Sig Start Date End Date Taking? Authorizing Provider  abacavir-dolutegravir-lamiVUDine (TRIUMEQ) 600-50-300 MG tablet Take 1 tablet by mouth at bedtime.     [provider]  apixaban (ELIQUIS) 5 MG TABS tablet Take 1 tablet (5 mg total) by mouth 2 (two) times daily. 08/21/19   Massie Maroon, [name]  cetirizine (ZYRTEC) 10 MG tablet Take 1 tablet (10 mg total) by mouth daily. Patient taking differently: Take 10 mg by mouth daily as needed for allergies.  08/04/19   [name], [name]  guaiFENesin-dextromethorphan (ROBITUSSIN DM) 100-10 MG/5ML syrup Take 5 mLs by mouth every 4 (four) hours as needed for cough. 11/23/19   Massie Maroon, [name]  hydroxyurea (HYDREA) 500 MG capsule Take 4 capsules (2,000 mg  total) by mouth at bedtime. 05/26/19   [name], [name]  Multiple Vitamin (MULTIVITAMIN WITH MINERALS) TABS tablet Take 1 tablet by mouth daily with breakfast.     [provider]  naloxone (NARCAN) 0.4 MG/ML injection As needed. 11/28/18   [name], [name]  oxyCODONE-acetaminophen (PERCOCET) 10-325 MG tablet Take 1 tablet  by mouth every 4 (four) hours as needed for pain. 11/23/19   [name], [name]  Vitamin D, Ergocalciferol, (DRISDOL) 1.25 MG (50000 UT) CAPS capsule Take 1 capsule (50,000 Units total) by mouth every 7 (seven) days. Patient taking differently: Take 50,000 Units by mouth every 7 (seven) days. Every Monday 11/01/18   [name], [name]     Physical Exam: There were no vitals filed for this visit.  General: Alert, awake, afebrile, anicteric, not in obvious distress HEENT: Normocephalic and Atraumatic, Mucous membranes pink                PERRLA; EOM intact; No scleral icterus,                 Nares: Patent, Oropharynx: Clear, Fair Dentition                 Neck: FROM, no cervical lymphadenopathy, thyromegaly, carotid bruit or JVD;  CHEST WALL: No tenderness  CHEST: Normal respiration, clear to auscultation bilaterally  HEART: Regular rate and rhythm; no murmurs rubs or gallops  BACK: No kyphosis or scoliosis; no CVA tenderness  ABDOMEN: Positive Bowel Sounds, soft, non-tender; no masses, no organomegaly EXTREMITIES: No cyanosis, clubbing, or edema SKIN:  no rash or ulceration  CNS: Alert and Oriented x 4, Nonfocal exam, CN 2-12 intact  Labs on Admission:  Basic Metabolic Panel: Recent Labs  Lab 12/04/19 0802  NA 140  K 4.1  CL 106  CO2 27  GLUCOSE 102*  BUN 13  CREATININE 0.73  CALCIUM 9.0   Liver Function Tests: Recent Labs  Lab 12/04/19 0802  AST 38  ALT 30  ALKPHOS 57  BILITOT 5.6*  PROT 8.0  ALBUMIN 3.8   No results for input(s): LIPASE, AMYLASE in the last 168 hours. No results for input(s): AMMONIA in the last 168 hours. CBC: Recent Labs  Lab 12/04/19 0802  WBC 14.0*  NEUTROABS 6.3  HGB 8.0*  HCT 22.2*  MCV 106.2*  PLT 272   Cardiac Enzymes: No results for input(s): CKTOTAL, CKMB, CKMBINDEX, TROPONINI in the last 168 hours.  BNP (last 3 results) No results for input(s): BNP in the last 8760 hours.  ProBNP (last 3 results) No results for  input(s): PROBNP in the last 8760 hours.  CBG: No results for input(s): GLUCAP in the last 168 hours.   Assessment/Plan Active Problems:   Sickle cell anemia with crisis (HCC)   Admits to the Day Hospital  IVF 0.45% Saline @ 125 mls/hour  Weight based Dilaudid PCA 0.5/10/3 started within 30 minutes of admission  Toradol is contraindicated due to allergy  Monitor vitals very closely, Re-evaluate pain scale every hour  2 L of Oxygen by [name]  Acetaminophen 1000 mg PO x 1  Patient will be re-evaluated for pain in the context of function and relationship to baseline as care progresses.  If no significant relieve from pain (remains above 5/10) will transfer patient to inpatient services for further evaluation and management  Code Status: Full  Family Communication: None  DVT Prophylaxis: Ambulate as tolerated   Time spent: 37 Minutes  Jeanann Lewandowsky, MD, MHA, FACP,  FAAP, CPE  If 7PM-7AM, please contact night-coverage [email or web address] 12/04/2019, 9:54 AM

## 2019-12-04 NOTE — ED Triage Notes (Signed)
Pt reports sickle cell pain x 1 day. He denies N/V. Reports home meds are not helping.

## 2019-12-04 NOTE — Discharge Instructions (Signed)
Sickle Cell Anemia, Adult  Sickle cell anemia is a condition where your red blood cells are shaped like sickles. Red blood cells carry oxygen through the body. Sickle-shaped cells do not live as long as normal red blood cells. They also clump together and block blood from flowing through the blood vessels. This prevents the body from getting enough oxygen. Sickle cell anemia causes organ damage and pain. It also increases the risk of infection. Follow these instructions at home: Medicines  Take over-the-counter and prescription medicines only as told by your doctor.  If you were prescribed an antibiotic medicine, take it as told by your doctor. Do not stop taking the antibiotic even if you start to feel better.  If you develop a fever, do not take medicines to lower the fever right away. Tell your doctor about the fever. Managing pain, stiffness, and swelling  Try these methods to help with pain: ? Use a heating pad. ? Take a warm bath. ? Distract yourself, such as by watching TV. Eating and drinking  Drink enough fluid to keep your pee (urine) clear or pale yellow. Drink more in hot weather and during exercise.  Limit or avoid alcohol.  Eat a healthy diet. Eat plenty of fruits, vegetables, whole grains, and lean protein.  Take vitamins and supplements as told by your doctor. Traveling  When traveling, keep these with you: ? Your medical information. ? The names of your doctors. ? Your medicines.  If you need to take an airplane, talk to your doctor first. Activity  Rest often.  Avoid exercises that make your heart beat much faster, such as jogging. General instructions  Do not use products that have nicotine or tobacco, such as cigarettes and e-cigarettes. If you need help quitting, ask your doctor.  Consider wearing a medical alert bracelet.  Avoid being in high places (high altitudes), such as mountains.  Avoid very hot or cold temperatures.  Avoid places where the  temperature changes a lot.  Keep all follow-up visits as told by your doctor. This is important. Contact a doctor if:  A joint hurts.  Your feet or hands hurt or swell.  You feel tired (fatigued). Get help right away if:  You have symptoms of infection. These include: ? Fever. ? Chills. ? Being very tired. ? Irritability. ? Poor eating. ? Throwing up (vomiting).  You feel dizzy or faint.  You have new stomach pain, especially on the left side.  You have a an erection (priapism) that lasts more than 4 hours.  You have numbness in your arms or legs.  You have a hard time moving your arms or legs.  You have trouble talking.  You have pain that does not go away when you take medicine.  You are short of breath.  You are breathing fast.  You have a long-term cough.  You have pain in your chest.  You have a bad headache.  You have a stiff neck.  Your stomach looks bloated even though you did not eat much.  Your skin is pale.  You suddenly cannot see well. Summary  Sickle cell anemia is a condition where your red blood cells are shaped like sickles.  Follow your doctor's advice on ways to manage pain, food to eat, activities to do, and steps to take for safe travel.  Get medical help right away if you have any signs of infection, such as a fever. This information is not intended to replace advice given to you by   your health care provider. Make sure you discuss any questions you have with your health care provider. Document Revised: 07/29/2018 Document Reviewed: 05/12/2016 Elsevier Patient Education  2020 Elsevier Inc.  

## 2019-12-04 NOTE — Discharge Summary (Signed)
Physician Discharge Summary  [name] [id number] DOB: 13-May-1990 DOA: 12/04/2019  PCP: [name], [name]  Admit date: 12/04/2019  Discharge date: 12/04/2019  Time spent: 30 minutes  Discharge Diagnoses:  Active Problems:   Sickle cell anemia with crisis [facility])   Discharge Condition: Stable  Diet recommendation: Regular  There were no vitals filed for this visit.  History of present illness:  Martin [name] is a 29 y.o. male with history of sickle cell disease, chronic pain syndrome, opiate tolerance and dependence, history of PE on Eliquis, HIV on antiretroviral therapy and anemia of chronic disease who was transitioned to the day hospital from the emergency room this morning with major complaints of generalized body pain but mostly in his lower back and lower extremity, consistent with his typical sickle cell pain crisis.  Patient stated this pain started last night and had continued this morning despite taking his home pain medication.  He described the pain as constant and throbbing, rated at 9/10.  He denies any headache, shortness of breath, cough, chest pain, fever, chills, nausea, vomiting or diarrhea.  He denies any urinary symptom, denies any sick contact or exposure to COVID-19 infections.    In the emergency room, patient was found to be afebrile, saturating 91% on room air, with no significant physical finding suggestive of any acute disease.  His laboratory investigation showed hemoglobin of 8, white cell count of 14 with normal CMP and slightly elevated total bilirubin which is consistent with his baseline. Patient was given 2 doses of Dilaudid IV and transitioned to the day hospital for extended observation and pain management.  Hospital Course:  [name] was admitted to the day hospital with sickle cell painful crisis. Patient was treated with weight based IV Dilaudid PCA, clinician assisted doses as deemed appropriate and IV fluids.  IV  Toradol is contraindicated due to allergies. Olliver showed significant improvement symptomatically, pain improved from 9 to 5/10 at the time of discharge. Patient was discharged home in a hemodynamically stable condition. Wenzel will follow-up at the clinic as previously scheduled, continue with home medications as per prior to admission.  Discharge Instructions We discussed the need for good hydration, monitoring of hydration status, avoidance of heat, cold, stress, and infection triggers. We discussed the need to be compliant with taking Hydrea and other home medications. [name] was reminded of the need to seek medical attention immediately if any symptom of bleeding, anemia, or infection occurs.  Discharge Exam: Vitals:   12/04/19 1326 12/04/19 1532  BP: 111/72 (!) 94/48  Pulse: 71 66  Resp: 15 10  Temp:    SpO2: 93% 95%   General appearance: alert, cooperative and no distress Eyes: conjunctivae/corneas clear. PERRL, EOM's intact. Fundi benign. Neck: no adenopathy, no carotid bruit, no JVD, supple, symmetrical, trachea midline and thyroid not enlarged, symmetric, no tenderness/mass/nodules Back: symmetric, no curvature. ROM normal. No CVA tenderness. Resp: clear to auscultation bilaterally Chest wall: no tenderness Cardio: regular rate and rhythm, S1, S2 normal, no murmur, click, rub or gallop GI: soft, non-tender; bowel sounds normal; no masses,  no organomegaly Extremities: extremities normal, atraumatic, no cyanosis or edema Pulses: 2+ and symmetric Skin: Skin color, texture, turgor normal. No rashes or lesions Neurologic: Grossly normal  Discharge Instructions    Diet - low sodium heart healthy   Complete by: As directed    Increase activity slowly   Complete by: As directed      Allergies as of 12/04/2019  Reactions   Tape Rash, Other (See Comments)   PLEASE DO NOT USE THE CLEAR, THICK, "PLASTIC" TAPE- only paper tape is tolerated    Ketorolac Swelling, Other  (See Comments)   Patient reports facial edema and left arm edema after administration.      Medication List    TAKE these medications   apixaban 5 MG Tabs tablet Commonly known as: Eliquis Take 1 tablet (5 mg total) by mouth 2 (two) times daily.   cetirizine 10 MG tablet Commonly known as: ZYRTEC Take 1 tablet (10 mg total) by mouth daily. What changed:   when to take this  reasons to take this   guaiFENesin-dextromethorphan 100-10 MG/5ML syrup Commonly known as: ROBITUSSIN DM Take 5 mLs by mouth every 4 (four) hours as needed for cough.   hydroxyurea 500 MG capsule Commonly known as: HYDREA Take 4 capsules (2,000 mg total) by mouth at bedtime.   multivitamin with minerals Tabs tablet Take 1 tablet by mouth daily with breakfast.   naloxone 0.4 MG/ML injection Commonly known as: NARCAN As needed.   oxyCODONE-acetaminophen 10-325 MG tablet Commonly known as: PERCOCET Take 1 tablet by mouth every 4 (four) hours as needed for pain.   Triumeq 600-50-300 MG tablet Generic drug: abacavir-dolutegravir-lamiVUDine Take 1 tablet by mouth at bedtime.   Vitamin D (Ergocalciferol) 1.25 MG (50000 UNIT) Caps capsule Commonly known as: DRISDOL Take 1 capsule (50,000 Units total) by mouth every 7 (seven) days. What changed: additional instructions      Allergies  Allergen Reactions  . Tape Rash and Other (See Comments)    PLEASE DO NOT USE THE CLEAR, THICK, "PLASTIC" TAPE- only paper tape is tolerated   . Ketorolac Swelling and Other (See Comments)    Patient reports facial edema and left arm edema after administration.     Significant Diagnostic Studies: DG Chest 2 View  Result Date: 11/21/2019 CLINICAL DATA:  Sickle cell pain crisis. EXAM: CHEST - 2 VIEW COMPARISON:  August 08, 2019 FINDINGS: Since the prior study there is been interval placement of a right-sided venous Port-A-Cath. Its distal tip is noted just beyond the junction of the superior vena cava and right  atrium. The lungs are mildly hyperinflated. Mild to moderate severity diffuse chronic appearing increased interstitial lung markings are noted. There is no evidence of acute infiltrate, pleural effusion or pneumothorax. The heart size and mediastinal contours are within normal limits. The visualized skeletal structures are unremarkable. IMPRESSION: Chronic appearing increased interstitial lung markings without evidence of acute or active cardiopulmonary disease. Electronically Signed   By: Aram Candela M.D.   On: 11/21/2019 19:37    Signed:  Jeanann Lewandowsky MD, MHA, FACP, Constance Goltz, CPE   12/04/2019, 3:37 PM

## 2019-12-05 ENCOUNTER — Telehealth: Payer: Self-pay | Admitting: Family Medicine

## 2019-12-05 LAB — DRUG SCREEN 764883 11+OXYCO+ALC+CRT-BUND
Amphetamines, Urine: NEGATIVE ng/mL
BENZODIAZ UR QL: NEGATIVE ng/mL
Barbiturate: NEGATIVE ng/mL
Cocaine (Metabolite): NEGATIVE ng/mL
Creatinine: 69.2 mg/dL (ref 20.0–300.0)
Ethanol: NEGATIVE %
Meperidine: NEGATIVE ng/mL
Methadone Screen, Urine: NEGATIVE ng/mL
Phencyclidine: NEGATIVE ng/mL
Propoxyphene: NEGATIVE ng/mL
Tramadol: NEGATIVE ng/mL
pH, Urine: 7.2 (ref 4.5–8.9)

## 2019-12-05 LAB — CANNABINOID CONFIRMATION, UR
CANNABINOIDS: POSITIVE — AB
Carboxy THC GC/MS Conf: 22 ng/mL

## 2019-12-05 LAB — OXYCODONE/OXYMORPHONE, CONFIRM
OXYCODONE/OXYMORPH: POSITIVE — AB
OXYCODONE: 447 ng/mL
OXYCODONE: POSITIVE — AB
OXYMORPHONE (GC/MS): 2073 ng/mL
OXYMORPHONE: POSITIVE — AB

## 2019-12-05 LAB — OPIATES CONFIRMATION, URINE: Opiates: NEGATIVE ng/mL

## 2019-12-06 ENCOUNTER — Other Ambulatory Visit: Payer: Self-pay | Admitting: Family Medicine

## 2019-12-06 DIAGNOSIS — D571 Sickle-cell disease without crisis: Secondary | ICD-10-CM

## 2019-12-06 DIAGNOSIS — G8929 Other chronic pain: Secondary | ICD-10-CM

## 2019-12-06 MED ORDER — OXYCODONE-ACETAMINOPHEN 10-325 MG PO TABS: 1.0000 | ORAL_TABLET | ORAL | 0 refills | Status: DC | PRN

## 2019-12-07 ENCOUNTER — Telehealth: Payer: Self-pay | Admitting: Family Medicine

## 2019-12-07 NOTE — Telephone Encounter (Signed)
Done

## 2019-12-08 ENCOUNTER — Other Ambulatory Visit: Payer: Self-pay | Admitting: Family Medicine

## 2019-12-08 DIAGNOSIS — D571 Sickle-cell disease without crisis: Secondary | ICD-10-CM

## 2019-12-08 DIAGNOSIS — G8929 Other chronic pain: Secondary | ICD-10-CM

## 2019-12-08 MED ORDER — HYDROXYUREA 500 MG PO CAPS: 2000.0000 mg | ORAL_CAPSULE | Freq: Every day | ORAL | 11 refills | Status: DC

## 2019-12-08 NOTE — Telephone Encounter (Signed)
Sent to NP 

## 2019-12-12 ENCOUNTER — Encounter (HOSPITAL_COMMUNITY): Payer: Self-pay | Admitting: Family Medicine

## 2019-12-12 ENCOUNTER — Non-Acute Institutional Stay (HOSPITAL_BASED_OUTPATIENT_CLINIC_OR_DEPARTMENT_OTHER)
Admission: AD | Admit: 2019-12-12 | Discharge: 2019-12-12 | Disposition: A | Discharge status: Home / Self Care | Payer: Medicaid Other | Source: Ambulatory Visit | Attending: Internal Medicine | Admitting: Internal Medicine

## 2019-12-12 ENCOUNTER — Telehealth (HOSPITAL_COMMUNITY): Payer: Self-pay | Admitting: General Practice

## 2019-12-12 ENCOUNTER — Other Ambulatory Visit: Payer: Self-pay

## 2019-12-12 ENCOUNTER — Encounter (HOSPITAL_COMMUNITY): Payer: Self-pay

## 2019-12-12 ENCOUNTER — Emergency Department (HOSPITAL_COMMUNITY)
Admission: EM | Admit: 2019-12-12 | Discharge: 2019-12-12 | Disposition: A | Discharge status: Home / Self Care | Payer: Medicaid Other | Attending: Emergency Medicine | Admitting: Emergency Medicine

## 2019-12-12 DIAGNOSIS — D57219 Sickle-cell/Hb-C disease with crisis, unspecified: Secondary | ICD-10-CM | POA: Diagnosis not present

## 2019-12-12 DIAGNOSIS — D5700 Hb-SS disease with crisis, unspecified: Secondary | ICD-10-CM | POA: Diagnosis not present

## 2019-12-12 DIAGNOSIS — F1120 Opioid dependence, uncomplicated: Secondary | ICD-10-CM | POA: Insufficient documentation

## 2019-12-12 DIAGNOSIS — Z21 Asymptomatic human immunodeficiency virus [HIV] infection status: Secondary | ICD-10-CM | POA: Insufficient documentation

## 2019-12-12 DIAGNOSIS — Z888 Allergy status to other drugs, medicaments and biological substances status: Secondary | ICD-10-CM | POA: Insufficient documentation

## 2019-12-12 DIAGNOSIS — G894 Chronic pain syndrome: Secondary | ICD-10-CM | POA: Insufficient documentation

## 2019-12-12 LAB — COMPREHENSIVE METABOLIC PANEL
ALT: 25 U/L (ref 0–44)
AST: 38 U/L (ref 15–41)
Albumin: 3.8 g/dL (ref 3.5–5.0)
Alkaline Phosphatase: 65 U/L (ref 38–126)
Anion gap: 7 (ref 5–15)
BUN: 7 mg/dL (ref 6–20)
CO2: 23 mmol/L (ref 22–32)
Calcium: 8.4 mg/dL — ABNORMAL LOW (ref 8.9–10.3)
Chloride: 103 mmol/L (ref 98–111)
Creatinine, Ser: 0.64 mg/dL (ref 0.61–1.24)
GFR calc Af Amer: >60 mL/min (ref >60)
GFR calc non Af Amer: >60 mL/min (ref >60)
Glucose, Bld: 92 mg/dL (ref 70–99)
Potassium: 4.2 mmol/L (ref 3.5–5.1)
Sodium: 133 mmol/L — ABNORMAL LOW (ref 135–145)
Total Bilirubin: 7.3 mg/dL — ABNORMAL HIGH (ref 0.3–1.2)
Total Protein: 8.2 g/dL — ABNORMAL HIGH (ref 6.5–8.1)

## 2019-12-12 LAB — RETICULOCYTES
Immature Retic Fract: 24.7 % — ABNORMAL HIGH (ref 2.3–15.9)
RBC.: 2.21 MIL/uL — ABNORMAL LOW (ref 4.22–5.81)
Retic Count, Absolute: 594 10*3/uL — ABNORMAL HIGH (ref 19.0–186.0)
Retic Ct Pct: 29 % — ABNORMAL HIGH (ref 0.4–3.1)

## 2019-12-12 LAB — CBC WITH DIFFERENTIAL/PLATELET
Abs Immature Granulocytes: 0.03 10*3/uL (ref 0.00–0.07)
Basophils Absolute: 0 10*3/uL (ref 0.0–0.1)
Basophils Relative: 0 %
Eosinophils Absolute: 0.2 10*3/uL (ref 0.0–0.5)
Eosinophils Relative: 2 %
HCT: 25.2 % — ABNORMAL LOW (ref 39.0–52.0)
Hemoglobin: 9 g/dL — ABNORMAL LOW (ref 13.0–17.0)
Immature Granulocytes: 0 %
Lymphocytes Relative: 43 %
Lymphs Abs: 3.8 10*3/uL (ref 0.7–4.0)
MCH: 39.5 pg — ABNORMAL HIGH (ref 26.0–34.0)
MCHC: 35.7 g/dL (ref 30.0–36.0)
MCV: 110.5 fL — ABNORMAL HIGH (ref 80.0–100.0)
Monocytes Absolute: 0.9 10*3/uL (ref 0.1–1.0)
Monocytes Relative: 10 %
Neutro Abs: 3.9 10*3/uL (ref 1.7–7.7)
Neutrophils Relative %: 45 %
Platelets: 265 10*3/uL (ref 150–400)
RBC: 2.28 MIL/uL — ABNORMAL LOW (ref 4.22–5.81)
RDW: 26.5 % — ABNORMAL HIGH (ref 11.5–15.5)
WBC: 8.8 10*3/uL (ref 4.0–10.5)
nRBC: 16.9 % — ABNORMAL HIGH (ref 0.0–0.2)

## 2019-12-12 MED ORDER — SENNOSIDES-DOCUSATE SODIUM 8.6-50 MG PO TABS: 1.0000 | ORAL_TABLET | Freq: Two times a day (BID) | ORAL | Status: DC

## 2019-12-12 MED ORDER — SODIUM CHLORIDE 0.9% FLUSH
9.0000 mL | INTRAVENOUS | Status: DC | PRN
  Administered 2019-12-12: 9 mL via INTRAVENOUS

## 2019-12-12 MED ORDER — HEPARIN SOD (PORK) LOCK FLUSH 100 UNIT/ML IV SOLN
500.0000 [IU] | INTRAVENOUS | Status: DC | PRN
  Administered 2019-12-12: 500 [IU]
  Filled 2019-12-12: qty 5

## 2019-12-12 MED ORDER — NALOXONE HCL 0.4 MG/ML IJ SOLN: 0.4000 mg | INTRAMUSCULAR | Status: DC | PRN

## 2019-12-12 MED ORDER — ACETAMINOPHEN 500 MG PO TABS
1000.0000 mg | ORAL_TABLET | Freq: Once | ORAL | Status: AC
  Administered 2019-12-12: 1000 mg via ORAL
  Filled 2019-12-12: qty 2

## 2019-12-12 MED ORDER — HYDROMORPHONE 1 MG/ML IV SOLN
INTRAVENOUS | Status: DC
  Administered 2019-12-12: 30 mg via INTRAVENOUS
  Administered 2019-12-12: 13.7 mg via INTRAVENOUS
  Filled 2019-12-12: qty 30

## 2019-12-12 MED ORDER — DIPHENHYDRAMINE HCL 25 MG PO CAPS
25.0000 mg | ORAL_CAPSULE | ORAL | Status: DC | PRN
  Administered 2019-12-12: 25 mg via ORAL
  Filled 2019-12-12: qty 1

## 2019-12-12 MED ORDER — ONDANSETRON HCL 4 MG/2ML IJ SOLN: 4.0000 mg | Freq: Four times a day (QID) | INTRAMUSCULAR | Status: DC | PRN

## 2019-12-12 MED ORDER — POLYETHYLENE GLYCOL 3350 17 G PO PACK: 17.0000 g | PACK | Freq: Every day | ORAL | Status: DC | PRN

## 2019-12-12 MED ORDER — SODIUM CHLORIDE 0.45 % IV SOLN: INTRAVENOUS | Status: DC

## 2019-12-12 MED ORDER — SODIUM CHLORIDE 0.9 % IV SOLN
25.0000 mg | INTRAVENOUS | Status: DC | PRN
  Filled 2019-12-12: qty 0.5

## 2019-12-12 NOTE — Telephone Encounter (Signed)
Patient called, requesting to come to the day hospital due to generalized pain rated at 10/10. Denied chest pain, fever, diarrhea, abdominal pain, nausea/vomitting and priapism. Screened negative for Covid-19 symptoms. Admitted to having means of transportation without driving self after treatment. Last took "10 mg of Percocet" at 06;00 am today. Per provider, patient can come to the day hospital for treatment. Patient notified, verbalized understanding.

## 2019-12-12 NOTE — Discharge Instructions (Signed)
Sickle Cell Anemia, Adult  Sickle cell anemia is a condition where your red blood cells are shaped like sickles. Red blood cells carry oxygen through the body. Sickle-shaped cells do not live as long as normal red blood cells. They also clump together and block blood from flowing through the blood vessels. This prevents the body from getting enough oxygen. Sickle cell anemia causes organ damage and pain. It also increases the risk of infection. Follow these instructions at home: Medicines  Take over-the-counter and prescription medicines only as told by your doctor.  If you were prescribed an antibiotic medicine, take it as told by your doctor. Do not stop taking the antibiotic even if you start to feel better.  If you develop a fever, do not take medicines to lower the fever right away. Tell your doctor about the fever. Managing pain, stiffness, and swelling  Try these methods to help with pain: ? Use a heating pad. ? Take a warm bath. ? Distract yourself, such as by watching TV. Eating and drinking  Drink enough fluid to keep your pee (urine) clear or pale yellow. Drink more in hot weather and during exercise.  Limit or avoid alcohol.  Eat a healthy diet. Eat plenty of fruits, vegetables, whole grains, and lean protein.  Take vitamins and supplements as told by your doctor. Traveling  When traveling, keep these with you: ? Your medical information. ? The names of your doctors. ? Your medicines.  If you need to take an airplane, talk to your doctor first. Activity  Rest often.  Avoid exercises that make your heart beat much faster, such as jogging. General instructions  Do not use products that have nicotine or tobacco, such as cigarettes and e-cigarettes. If you need help quitting, ask your doctor.  Consider wearing a medical alert bracelet.  Avoid being in high places (high altitudes), such as mountains.  Avoid very hot or cold temperatures.  Avoid places where the  temperature changes a lot.  Keep all follow-up visits as told by your doctor. This is important. Contact a doctor if:  A joint hurts.  Your feet or hands hurt or swell.  You feel tired (fatigued). Get help right away if:  You have symptoms of infection. These include: ? Fever. ? Chills. ? Being very tired. ? Irritability. ? Poor eating. ? Throwing up (vomiting).  You feel dizzy or faint.  You have new stomach pain, especially on the left side.  You have a an erection (priapism) that lasts more than 4 hours.  You have numbness in your arms or legs.  You have a hard time moving your arms or legs.  You have trouble talking.  You have pain that does not go away when you take medicine.  You are short of breath.  You are breathing fast.  You have a long-term cough.  You have pain in your chest.  You have a bad headache.  You have a stiff neck.  Your stomach looks bloated even though you did not eat much.  Your skin is pale.  You suddenly cannot see well. Summary  Sickle cell anemia is a condition where your red blood cells are shaped like sickles.  Follow your doctor's advice on ways to manage pain, food to eat, activities to do, and steps to take for safe travel.  Get medical help right away if you have any signs of infection, such as a fever. This information is not intended to replace advice given to you by   your health care provider. Make sure you discuss any questions you have with your health care provider. Document Revised: 07/29/2018 Document Reviewed: 05/12/2016 Elsevier Patient Education  2020 Elsevier Inc.  

## 2019-12-12 NOTE — ED Triage Notes (Signed)
Pt reports sickle cell pain gradually increasing over the last day. Breakthrough pain medications not working.

## 2019-12-12 NOTE — Discharge Summary (Signed)
[facility] Discharge Summary   Patient ID: Martin [name] MRN: [id number] DOB/AGE: 30-05-1990 30 y.o.  Admit date: 12/12/2019 Discharge date: 12/12/2019  Primary Care Physician:  [name], [name]  Admission Diagnoses:  Active Problems:   Sickle cell pain crisis [facility])   Discharge Medications:  Allergies as of 12/12/2019      Reactions   Tape Rash, Other (See Comments)   PLEASE DO NOT USE THE CLEAR, THICK, "PLASTIC" TAPE- only paper tape is tolerated    Ketorolac Swelling, Other (See Comments)   Patient reports facial edema and left arm edema after administration.      Medication List    TAKE these medications   apixaban 5 MG Tabs tablet Commonly known as: Eliquis Take 1 tablet (5 mg total) by mouth 2 (two) times daily.   cetirizine 10 MG tablet Commonly known as: ZYRTEC Take 1 tablet (10 mg total) by mouth daily. What changed:   when to take this  reasons to take this   guaiFENesin-dextromethorphan 100-10 MG/5ML syrup Commonly known as: ROBITUSSIN DM Take 5 mLs by mouth every 4 (four) hours as needed for cough.   hydroxyurea 500 MG capsule Commonly known as: HYDREA Take 4 capsules (2,000 mg total) by mouth at bedtime.   multivitamin with minerals Tabs tablet Take 1 tablet by mouth daily with breakfast.   naloxone 0.4 MG/ML injection Commonly known as: NARCAN As needed.   oxyCODONE-acetaminophen 10-325 MG tablet Commonly known as: PERCOCET Take 1 tablet by mouth every 4 (four) hours as needed for pain.   Triumeq 600-50-300 MG tablet Generic drug: abacavir-dolutegravir-lamiVUDine Take 1 tablet by mouth at bedtime.   Vitamin D (Ergocalciferol) 1.25 MG (50000 UNIT) Caps capsule Commonly known as: DRISDOL Take 1 capsule (50,000 Units total) by mouth every 7 (seven) days. What changed: additional instructions        Consults:  None  Significant Diagnostic Studies:  DG Chest 2 View  Result Date: 11/21/2019 CLINICAL DATA:   Sickle cell pain crisis. EXAM: CHEST - 2 VIEW COMPARISON:  August 08, 2019 FINDINGS: Since the prior study there is been interval placement of a right-sided venous Port-A-Cath. Its distal tip is noted just beyond the junction of the superior vena cava and right atrium. The lungs are mildly hyperinflated. Mild to moderate severity diffuse chronic appearing increased interstitial lung markings are noted. There is no evidence of acute infiltrate, pleural effusion or pneumothorax. The heart size and mediastinal contours are within normal limits. The visualized skeletal structures are unremarkable. IMPRESSION: Chronic appearing increased interstitial lung markings without evidence of acute or active cardiopulmonary disease. Electronically Signed   By: Aram Candela M.D.   On: 11/21/2019 19:37    History of present illness:  Martin [name] is a 29 year old male with a medical history significant for sickle cell disease, chronic pain syndrome, opiate dependence and tolerance, HIV disease, and history of anemia of chronic disease presented complaining of bilateral hip and low back pain that is consistent with his previous pain crisis. Patient says that pain intensity increased several days ago and has been unrelieved by home medications. He has not identified any aggravating factors concerning crisis. Pain intensity is 10/10 characterized as constant and throbbing. Patient last had Percocet this a.m. without sustained relief. He denies any headache, chest pain, fever, chills, urinary symptoms, nausea, vomiting, or diarrhea. Patient denies any sick contacts, recent travel, or exposure to COVID-19. [facility] Course: Patient admitted to sickle cell day clinic for management  of pain crisis. Reviewed all laboratory values, total bilirubin markedly elevated at 7.3, which is increased from previous. Patient's hemoglobin is 9.0 g/dL, which is consistent with his baseline. Pain managed with IV Dilaudid  PCA with settings of 0.5 mg, 10-minute lockout, and 3 mg/h. Tylenol 1000 mg x 1 IV fluids, 0.45% saline at 100 mL/h. Pain intensity decreased to 3/10. He does not warrant inpatient admission at this time. Patient advised to resume all home medications. He is alert, oriented, and ambulating without assistance. Patient was discharged home in a hemodynamically stable condition.  Discharge instructions: Resume all home medications.   Follow up with PCP as previously  scheduled.   Discussed the importance of drinking 64 ounces of water daily, dehydration of red blood cells may lead further sickling.   Avoid all stressors that precipitate sickle cell pain crisis.     The patient was given clear instructions to go to ER or return to medical center if symptoms do not improve, worsen or new problems develop.     Physical Exam at Discharge:  BP (!) 104/53 (BP Location: Right Arm)   Pulse 62   Temp 97.7 F (36.5 C) (Oral)   Resp 12   SpO2 96%  Physical Exam Constitutional:      Appearance: Normal appearance.  Eyes:     Pupils: Pupils are equal, round, and reactive to light.  Cardiovascular:     Rate and Rhythm: Normal rate and regular rhythm.     Pulses: Normal pulses.  Pulmonary:     Effort: Pulmonary effort is normal.  Abdominal:     General: Abdomen is flat. Bowel sounds are normal.  Musculoskeletal:        General: Normal range of motion.  Skin:    General: Skin is warm.  Neurological:     General: No focal deficit present.     Mental Status: He is alert. Mental status is at baseline.  Psychiatric:        Mood and Affect: Mood normal.        Behavior: Behavior normal.        Thought Content: Thought content normal.        Judgment: Judgment normal.     Disposition at Discharge: Discharge disposition: 01-Home or Self Care       Discharge Orders:   Condition at Discharge:   Stable  Time spent on Discharge:  Greater than 30 minutes.  Signed: [name]  APRN, [name], [name] Patient Care [facility] [address] [place], [place] [address] [phone]  12/12/2019, 4:59 PM

## 2019-12-12 NOTE — H&P (Signed)
[facility] History and Physical   Date: 12/12/2019  Patient name: Martin [name] Medical record number: [id number] Date of birth: 1990/07/10 Age: 29 y.o. Gender: male PCP: [name], [name]  Attending physician: No att. providers found  Chief Complaint: Sickle cell pain  History of Present Illness: Martin [name] is a 29 year old male with a medical history significant for sickle cell disease, chronic pain syndrome, opiate dependence and tolerance, HIV disease, and history of anemia of chronic disease presented complaining of bilateral hip and low back pain that is consistent with his previous pain crisis. Patient says that pain intensity increased several days ago and has been unrelieved by home medications. He has not identified any aggravating factors concerning crisis. Pain intensity is 10/10 characterized as constant and throbbing. Patient last had Percocet this a.m. without sustained relief. He denies any headache, chest pain, fever, chills, urinary symptoms, nausea, vomiting, or diarrhea. Patient denies any sick contacts, recent travel, or exposure to COVID-19.  Meds: No medications prior to admission.    Allergies: Tape and Ketorolac Past Medical History:  Diagnosis Date  . Anxiety   . HIV (human immunodeficiency virus infection) (HCC)   . Proteinuria   . Sickle cell crisis (HCC)   . 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HCC)   . Vitamin D deficiency 10/2018   Past Surgical History:  Procedure Laterality Date  . IR IMAGING GUIDED PORT INSERTION  08/29/2019   Family History  Problem Relation Age of Onset  . Sickle cell trait Mother   . Sickle cell trait Father   . Birth defects Maternal Grandmother   . Birth defects Paternal Grandmother    Social History   Socioeconomic History  . Marital status: Single    Spouse name: Not on file  . Number of children: Not on file  . Years of education: Not on file  . Highest education level: Not on file   Occupational History  . Not on file  Tobacco Use  . Smoking status: Never Smoker  . Smokeless tobacco: Never Used  Vaping Use  . Vaping Use: Some days  . Substances: THC  Substance and Sexual Activity  . Alcohol use: No  . Drug use: Yes    Types: Marijuana  . Sexual activity: Yes    Birth control/protection: Condom  Other Topics Concern  . Not on file  Social History Narrative  . Not on file   Social Determinants of Health   Financial Resource Strain:   . Difficulty of Paying Living Expenses: Not on file  Food Insecurity:   . Worried About Programme researcher, broadcasting/film/video in the Last Year: Not on file  . Ran Out of Food in the Last Year: Not on file  Transportation Needs:   . Lack of Transportation (Medical): Not on file  . Lack of Transportation (Non-Medical): Not on file  Physical Activity:   . Days of Exercise per Week: Not on file  . Minutes of Exercise per Session: Not on file  Stress:   . Feeling of Stress : Not on file  Social Connections:   . Frequency of Communication with Friends and Family: Not on file  . Frequency of Social Gatherings with Friends and Family: Not on file  . Attends Religious Services: Not on file  . Active Member of Clubs or Organizations: Not on file  . Attends Banker Meetings: Not on file  . Marital Status: Not on file  Intimate Partner Violence:   . Fear of Current  or Ex-Partner: Not on file  . Emotionally Abused: Not on file  . Physically Abused: Not on file  . Sexually Abused: Not on file   Review of Systems  Constitutional: Negative for chills and fever.  HENT: Negative.   Eyes: Negative.   Respiratory: Negative.   Cardiovascular: Negative.   Gastrointestinal: Negative.   Genitourinary: Negative.   Musculoskeletal: Positive for back pain and joint pain.  Skin: Negative.   Neurological: Negative.   Psychiatric/Behavioral: Negative.     Physical Exam: Blood pressure (!) 104/53, pulse 62, temperature 97.7 F (36.5 C),  temperature source Oral, resp. rate 12, SpO2 96 %. Physical Exam Constitutional:      Appearance: Normal appearance.  HENT:     Mouth/Throat:     Mouth: Mucous membranes are moist.     Pharynx: Oropharynx is clear.  Eyes:     Pupils: Pupils are equal, round, and reactive to light.  Cardiovascular:     Rate and Rhythm: Normal rate.  Pulmonary:     Effort: Pulmonary effort is normal.  Abdominal:     General: Abdomen is flat. Bowel sounds are normal.  Musculoskeletal:        General: Normal range of motion.  Skin:    General: Skin is warm.  Neurological:     General: No focal deficit present.     Mental Status: He is alert. Mental status is at baseline.  Psychiatric:        Mood and Affect: Mood normal.        Thought Content: Thought content normal.        Judgment: Judgment normal.      Lab results: Results for orders placed or performed during the hospital encounter of 12/12/19 (from the past 24 hour(s))  Comprehensive metabolic panel     Status: Abnormal   Collection Time: 12/12/19  9:55 AM  Result Value Ref Range   Sodium 133 (L) 135 - 145 mmol/L   Potassium 4.2 3.5 - 5.1 mmol/L   Chloride 103 98 - 111 mmol/L   CO2 23 22 - 32 mmol/L   Glucose, Bld 92 70 - 99 mg/dL   BUN 7 6 - 20 mg/dL   Creatinine, Ser 3.66 0.61 - 1.24 mg/dL   Calcium 8.4 (L) 8.9 - 10.3 mg/dL   Total Protein 8.2 (H) 6.5 - 8.1 g/dL   Albumin 3.8 3.5 - 5.0 g/dL   AST 38 15 - 41 U/L   ALT 25 0 - 44 U/L   Alkaline Phosphatase 65 38 - 126 U/L   Total Bilirubin 7.3 (H) 0.3 - 1.2 mg/dL   GFR calc non Af Amer >60 >60 mL/min   GFR calc Af Amer >60 >60 mL/min   Anion gap 7 5 - 15  CBC WITH DIFFERENTIAL     Status: Abnormal   Collection Time: 12/12/19  9:55 AM  Result Value Ref Range   WBC 8.8 4.0 - 10.5 K/uL   RBC 2.28 (L) 4.22 - 5.81 MIL/uL   Hemoglobin 9.0 (L) 13.0 - 17.0 g/dL   HCT 44.0 (L) 39 - 52 %   MCV 110.5 (H) 80.0 - 100.0 fL   MCH 39.5 (H) 26.0 - 34.0 pg   MCHC 35.7 30.0 - 36.0 g/dL    RDW 34.7 (H) 42.5 - 15.5 %   Platelets 265 150 - 400 K/uL   nRBC 16.9 (H) 0.0 - 0.2 %   Neutrophils Relative % 45 %   Neutro Abs 3.9 1.7 - 7.7  K/uL   Lymphocytes Relative 43 %   Lymphs Abs 3.8 0.7 - 4.0 K/uL   Monocytes Relative 10 %   Monocytes Absolute 0.9 0 - 1 K/uL   Eosinophils Relative 2 %   Eosinophils Absolute 0.2 0 - 0 K/uL   Basophils Relative 0 %   Basophils Absolute 0.0 0 - 0 K/uL   Immature Granulocytes 0 %   Abs Immature Granulocytes 0.03 0.00 - 0.07 K/uL   Agglutination PRESENT    Carollee Massed Bodies PRESENT    Polychromasia PRESENT    Sickle Cells MARKED    Target Cells PRESENT   Reticulocytes     Status: Abnormal   Collection Time: 12/12/19  9:55 AM  Result Value Ref Range   Retic Ct Pct 29.0 (H) 0.4 - 3.1 %   RBC. 2.21 (L) 4.22 - 5.81 MIL/uL   Retic Count, Absolute 594.0 (H) 19.0 - 186.0 K/uL   Immature Retic Fract 24.7 (H) 2.3 - 15.9 %    Imaging results:  No results found.   Assessment & Plan: Patient admitted to sickle cell day infusion center for management of pain crisis.  Patient is opiate tolerant Initiate IV dilaudid PCA. Settings of 0.5 mg, 10 minute lockout, and 3 mg/hr IV fluids, 0.45% saline at 100 ml/hr Tylenol 1000 mg by mouth times one dose Review CBC with differential, complete metabolic panel, and reticulocytes as results become available. Baseline hemoglobin is Pain intensity will be reevaluated in context of functioning and relationship to baseline as care progress If pain intensity remains elevated and/or sudden change in hemodynamic stability transition to inpatient services for higher level of care.    [name]  APRN, [name], [name] Patient Care [facility] [address] [place], [place] [address] [phone]  12/12/2019, 5:26 PM

## 2019-12-12 NOTE — Progress Notes (Signed)
Patient admitted to the day hospital for treatment of sickle cell pain crisis. Patient reported generalized pain rated 10/10. Patient placed on Dilaudid PCA, given PO tylenol, PO benadryl and hydrated with IV fluids. At discharge patient reported  pain at 3/10. Patient declined AVS. Patient alert, oriented and ambulatory at discharge.

## 2019-12-22 ENCOUNTER — Other Ambulatory Visit: Payer: Self-pay | Admitting: Family Medicine

## 2019-12-22 ENCOUNTER — Telehealth: Payer: Self-pay | Admitting: Family Medicine

## 2019-12-22 DIAGNOSIS — D571 Sickle-cell disease without crisis: Secondary | ICD-10-CM

## 2019-12-22 DIAGNOSIS — F419 Anxiety disorder, unspecified: Secondary | ICD-10-CM

## 2019-12-22 DIAGNOSIS — G8929 Other chronic pain: Secondary | ICD-10-CM

## 2019-12-22 MED ORDER — OXYCODONE-ACETAMINOPHEN 10-325 MG PO TABS
1.0000 | ORAL_TABLET | ORAL | 0 refills | Status: DC | PRN
Start: 2019-12-22 — End: 2020-01-05

## 2019-12-22 MED ORDER — ALPRAZOLAM 1 MG PO TABS: ORAL_TABLET | ORAL | 0 refills | Status: DC

## 2019-12-22 NOTE — Telephone Encounter (Signed)
Done

## 2020-01-01 ENCOUNTER — Emergency Department (HOSPITAL_COMMUNITY)
Admission: EM | Admit: 2020-01-01 | Discharge: 2020-01-01 | Disposition: A | Discharge status: Home / Self Care | Payer: Medicaid Other | Source: Home / Self Care

## 2020-01-01 ENCOUNTER — Encounter (HOSPITAL_COMMUNITY): Payer: Self-pay | Admitting: Emergency Medicine

## 2020-01-01 ENCOUNTER — Encounter (HOSPITAL_COMMUNITY): Payer: Self-pay

## 2020-01-01 ENCOUNTER — Other Ambulatory Visit: Payer: Self-pay

## 2020-01-01 DIAGNOSIS — Z79899 Other long term (current) drug therapy: Secondary | ICD-10-CM

## 2020-01-01 DIAGNOSIS — D638 Anemia in other chronic diseases classified elsewhere: Secondary | ICD-10-CM | POA: Diagnosis present

## 2020-01-01 DIAGNOSIS — D5700 Hb-SS disease with crisis, unspecified: Secondary | ICD-10-CM | POA: Insufficient documentation

## 2020-01-01 DIAGNOSIS — Z7901 Long term (current) use of anticoagulants: Secondary | ICD-10-CM

## 2020-01-01 DIAGNOSIS — D72829 Elevated white blood cell count, unspecified: Secondary | ICD-10-CM | POA: Diagnosis present

## 2020-01-01 DIAGNOSIS — K5900 Constipation, unspecified: Secondary | ICD-10-CM | POA: Diagnosis present

## 2020-01-01 DIAGNOSIS — Z21 Asymptomatic human immunodeficiency virus [HIV] infection status: Secondary | ICD-10-CM | POA: Diagnosis present

## 2020-01-01 DIAGNOSIS — F411 Generalized anxiety disorder: Secondary | ICD-10-CM | POA: Diagnosis present

## 2020-01-01 DIAGNOSIS — F1120 Opioid dependence, uncomplicated: Secondary | ICD-10-CM | POA: Diagnosis present

## 2020-01-01 DIAGNOSIS — Z79891 Long term (current) use of opiate analgesic: Secondary | ICD-10-CM

## 2020-01-01 DIAGNOSIS — Z5321 Procedure and treatment not carried out due to patient leaving prior to being seen by health care provider: Secondary | ICD-10-CM | POA: Insufficient documentation

## 2020-01-01 DIAGNOSIS — Z832 Family history of diseases of the blood and blood-forming organs and certain disorders involving the immune mechanism: Secondary | ICD-10-CM

## 2020-01-01 DIAGNOSIS — E559 Vitamin D deficiency, unspecified: Secondary | ICD-10-CM | POA: Diagnosis present

## 2020-01-01 DIAGNOSIS — Z20822 Contact with and (suspected) exposure to covid-19: Secondary | ICD-10-CM | POA: Diagnosis present

## 2020-01-01 DIAGNOSIS — F419 Anxiety disorder, unspecified: Secondary | ICD-10-CM | POA: Diagnosis present

## 2020-01-01 DIAGNOSIS — Z86711 Personal history of pulmonary embolism: Secondary | ICD-10-CM

## 2020-01-01 DIAGNOSIS — Z888 Allergy status to other drugs, medicaments and biological substances status: Secondary | ICD-10-CM

## 2020-01-01 DIAGNOSIS — G894 Chronic pain syndrome: Secondary | ICD-10-CM | POA: Diagnosis present

## 2020-01-01 NOTE — ED Triage Notes (Signed)
Pt reports sickle cell pain worse in chest. Pt has been taking home oxycodone with no relief. O2 sats were 84% ra. 2L [place] applied and improved to 94%.

## 2020-01-01 NOTE — ED Triage Notes (Signed)
Per pt, states SCC since yesterday, complaining of pain all over-complaining of CP-home meds not working

## 2020-01-01 NOTE — ED Notes (Signed)
Pt requests blood be obtained from port.

## 2020-01-01 NOTE — ED Notes (Signed)
Pt took O2 off and left. O2 tank found sitting in lobby

## 2020-01-02 ENCOUNTER — Inpatient Hospital Stay (HOSPITAL_COMMUNITY)
Admission: EM | Admit: 2020-01-02 | Discharge: 2020-01-05 | DRG: 812 | Disposition: A | Discharge status: Home / Self Care | Payer: Medicaid Other | Attending: Internal Medicine | Admitting: Internal Medicine

## 2020-01-02 ENCOUNTER — Emergency Department (HOSPITAL_COMMUNITY): Payer: Medicaid Other

## 2020-01-02 DIAGNOSIS — D72829 Elevated white blood cell count, unspecified: Secondary | ICD-10-CM

## 2020-01-02 DIAGNOSIS — G894 Chronic pain syndrome: Secondary | ICD-10-CM | POA: Diagnosis present

## 2020-01-02 DIAGNOSIS — F1190 Opioid use, unspecified, uncomplicated: Secondary | ICD-10-CM | POA: Diagnosis present

## 2020-01-02 DIAGNOSIS — Z21 Asymptomatic human immunodeficiency virus [HIV] infection status: Secondary | ICD-10-CM | POA: Diagnosis present

## 2020-01-02 DIAGNOSIS — Z20822 Contact with and (suspected) exposure to covid-19: Secondary | ICD-10-CM | POA: Diagnosis present

## 2020-01-02 DIAGNOSIS — Z79899 Other long term (current) drug therapy: Secondary | ICD-10-CM | POA: Diagnosis not present

## 2020-01-02 DIAGNOSIS — Z79891 Long term (current) use of opiate analgesic: Secondary | ICD-10-CM | POA: Diagnosis not present

## 2020-01-02 DIAGNOSIS — F411 Generalized anxiety disorder: Secondary | ICD-10-CM | POA: Diagnosis present

## 2020-01-02 DIAGNOSIS — D5700 Hb-SS disease with crisis, unspecified: Secondary | ICD-10-CM | POA: Diagnosis present

## 2020-01-02 DIAGNOSIS — Z888 Allergy status to other drugs, medicaments and biological substances status: Secondary | ICD-10-CM | POA: Diagnosis not present

## 2020-01-02 DIAGNOSIS — Z7901 Long term (current) use of anticoagulants: Secondary | ICD-10-CM | POA: Diagnosis not present

## 2020-01-02 DIAGNOSIS — D638 Anemia in other chronic diseases classified elsewhere: Secondary | ICD-10-CM | POA: Diagnosis present

## 2020-01-02 DIAGNOSIS — E559 Vitamin D deficiency, unspecified: Secondary | ICD-10-CM | POA: Diagnosis present

## 2020-01-02 DIAGNOSIS — Z86711 Personal history of pulmonary embolism: Secondary | ICD-10-CM | POA: Diagnosis present

## 2020-01-02 DIAGNOSIS — Z832 Family history of diseases of the blood and blood-forming organs and certain disorders involving the immune mechanism: Secondary | ICD-10-CM | POA: Diagnosis not present

## 2020-01-02 DIAGNOSIS — F419 Anxiety disorder, unspecified: Secondary | ICD-10-CM | POA: Diagnosis present

## 2020-01-02 DIAGNOSIS — F1120 Opioid dependence, uncomplicated: Secondary | ICD-10-CM | POA: Diagnosis present

## 2020-01-02 DIAGNOSIS — B20 Human immunodeficiency virus [HIV] disease: Secondary | ICD-10-CM | POA: Diagnosis present

## 2020-01-02 DIAGNOSIS — K5900 Constipation, unspecified: Secondary | ICD-10-CM | POA: Diagnosis present

## 2020-01-02 LAB — CBC WITH DIFFERENTIAL/PLATELET
Abs Immature Granulocytes: 0.05 10*3/uL (ref 0.00–0.07)
Basophils Absolute: 0.1 10*3/uL (ref 0.0–0.1)
Basophils Relative: 0 %
Eosinophils Absolute: 0.1 10*3/uL (ref 0.0–0.5)
Eosinophils Relative: 0 %
HCT: 25.6 % — ABNORMAL LOW (ref 39.0–52.0)
Hemoglobin: 9.6 g/dL — ABNORMAL LOW (ref 13.0–17.0)
Immature Granulocytes: 0 %
Lymphocytes Relative: 54 %
Lymphs Abs: 7.3 10*3/uL — ABNORMAL HIGH (ref 0.7–4.0)
MCH: 38.9 pg — ABNORMAL HIGH (ref 26.0–34.0)
MCHC: 37.5 g/dL — ABNORMAL HIGH (ref 30.0–36.0)
MCV: 103.6 fL — ABNORMAL HIGH (ref 80.0–100.0)
Monocytes Absolute: 1.8 10*3/uL — ABNORMAL HIGH (ref 0.1–1.0)
Monocytes Relative: 13 %
Neutro Abs: 4.5 10*3/uL (ref 1.7–7.7)
Neutrophils Relative %: 33 %
Platelets: 340 10*3/uL (ref 150–400)
RBC: 2.47 MIL/uL — ABNORMAL LOW (ref 4.22–5.81)
RDW: 24.3 % — ABNORMAL HIGH (ref 11.5–15.5)
WBC: 13.8 10*3/uL — ABNORMAL HIGH (ref 4.0–10.5)
nRBC: 10.2 % — ABNORMAL HIGH (ref 0.0–0.2)

## 2020-01-02 LAB — RETICULOCYTES
Immature Retic Fract: 33.3 % — ABNORMAL HIGH (ref 2.3–15.9)
RBC.: 2.45 MIL/uL — ABNORMAL LOW (ref 4.22–5.81)
Retic Count, Absolute: 498 10*3/uL — ABNORMAL HIGH (ref 19.0–186.0)
Retic Ct Pct: 20.3 % — ABNORMAL HIGH (ref 0.4–3.1)

## 2020-01-02 LAB — COMPREHENSIVE METABOLIC PANEL
ALT: 24 U/L (ref 0–44)
AST: 52 U/L — ABNORMAL HIGH (ref 15–41)
Albumin: 4 g/dL (ref 3.5–5.0)
Alkaline Phosphatase: 60 U/L (ref 38–126)
Anion gap: 11 (ref 5–15)
BUN: 12 mg/dL (ref 6–20)
CO2: 23 mmol/L (ref 22–32)
Calcium: 9.1 mg/dL (ref 8.9–10.3)
Chloride: 106 mmol/L (ref 98–111)
Creatinine, Ser: 0.63 mg/dL (ref 0.61–1.24)
GFR calc Af Amer: >60 mL/min (ref >60)
GFR calc non Af Amer: >60 mL/min (ref >60)
Glucose, Bld: 97 mg/dL (ref 70–99)
Potassium: 4.1 mmol/L (ref 3.5–5.1)
Sodium: 140 mmol/L (ref 135–145)
Total Bilirubin: 8 mg/dL — ABNORMAL HIGH (ref 0.3–1.2)
Total Protein: 9.6 g/dL — ABNORMAL HIGH (ref 6.5–8.1)

## 2020-01-02 LAB — RAPID URINE DRUG SCREEN, HOSP PERFORMED
Amphetamines: NOT DETECTED
Barbiturates: NOT DETECTED
Benzodiazepines: NOT DETECTED
Cocaine: NOT DETECTED
Opiates: POSITIVE — AB
Tetrahydrocannabinol: POSITIVE — AB

## 2020-01-02 LAB — URINALYSIS, ROUTINE W REFLEX MICROSCOPIC
Bacteria, UA: NONE SEEN
Bilirubin Urine: NEGATIVE
Glucose, UA: NEGATIVE mg/dL
Ketones, ur: NEGATIVE mg/dL
Leukocytes,Ua: NEGATIVE
Nitrite: NEGATIVE
Protein, ur: NEGATIVE mg/dL
Specific Gravity, Urine: 1.012 (ref 1.005–1.030)
pH: 5 (ref 5.0–8.0)

## 2020-01-02 LAB — TROPONIN I (HIGH SENSITIVITY): Troponin I (High Sensitivity): 6 ng/L (ref <18)

## 2020-01-02 LAB — SARS CORONAVIRUS 2 (TAT 6-24 HRS): SARS Coronavirus 2: NEGATIVE

## 2020-01-02 MED ORDER — HYDROMORPHONE HCL 2 MG/ML IJ SOLN
2.0000 mg | Freq: Once | INTRAMUSCULAR | Status: AC
  Administered 2020-01-02: 2 mg via INTRAVENOUS
  Filled 2020-01-02: qty 1

## 2020-01-02 MED ORDER — DIPHENHYDRAMINE HCL 25 MG PO CAPS
25.0000 mg | ORAL_CAPSULE | ORAL | Status: DC | PRN
  Filled 2020-01-02: qty 1

## 2020-01-02 MED ORDER — SODIUM CHLORIDE 0.9% FLUSH: 9.0000 mL | INTRAVENOUS | Status: DC | PRN

## 2020-01-02 MED ORDER — LORATADINE 10 MG PO TABS
10.0000 mg | ORAL_TABLET | Freq: Every day | ORAL | Status: DC
  Administered 2020-01-02 – 2020-01-05 (×4): 10 mg via ORAL
  Filled 2020-01-02 (×4): qty 1

## 2020-01-02 MED ORDER — HYDROMORPHONE 1 MG/ML IV SOLN
INTRAVENOUS | Status: DC
  Administered 2020-01-02: 30 mg via INTRAVENOUS
  Administered 2020-01-02: 8.5 mg via INTRAVENOUS
  Administered 2020-01-02: 5.5 mg via INTRAVENOUS
  Administered 2020-01-03: 3 mg via INTRAVENOUS
  Administered 2020-01-03 (×2): 6.5 mg via INTRAVENOUS
  Filled 2020-01-02 (×2): qty 30

## 2020-01-02 MED ORDER — CHLORHEXIDINE GLUCONATE CLOTH 2 % EX PADS
6.0000 | MEDICATED_PAD | Freq: Every day | CUTANEOUS | Status: DC
  Administered 2020-01-03 – 2020-01-05 (×3): 6 via TOPICAL

## 2020-01-02 MED ORDER — APIXABAN 5 MG PO TABS
5.0000 mg | ORAL_TABLET | Freq: Two times a day (BID) | ORAL | Status: DC
  Administered 2020-01-02 – 2020-01-05 (×6): 5 mg via ORAL
  Filled 2020-01-02 (×6): qty 1

## 2020-01-02 MED ORDER — ADULT MULTIVITAMIN W/MINERALS CH
1.0000 | ORAL_TABLET | Freq: Every day | ORAL | Status: DC
  Administered 2020-01-03 – 2020-01-05 (×3): 1 via ORAL
  Filled 2020-01-02 (×3): qty 1

## 2020-01-02 MED ORDER — ALPRAZOLAM 0.25 MG PO TABS: 0.2500 mg | ORAL_TABLET | Freq: Three times a day (TID) | ORAL | Status: DC | PRN

## 2020-01-02 MED ORDER — ABACAVIR-DOLUTEGRAVIR-LAMIVUD 600-50-300 MG PO TABS
1.0000 | ORAL_TABLET | Freq: Every day | ORAL | Status: DC
  Administered 2020-01-02 – 2020-01-04 (×3): 1 via ORAL
  Filled 2020-01-02 (×3): qty 1

## 2020-01-02 MED ORDER — DIPHENHYDRAMINE HCL 50 MG/ML IJ SOLN
12.5000 mg | Freq: Once | INTRAMUSCULAR | Status: AC
  Administered 2020-01-02: 12.5 mg via INTRAVENOUS
  Filled 2020-01-02: qty 1

## 2020-01-02 MED ORDER — HYDROXYUREA 500 MG PO CAPS
2000.0000 mg | ORAL_CAPSULE | Freq: Every day | ORAL | Status: DC
  Administered 2020-01-02 – 2020-01-04 (×3): 2000 mg via ORAL
  Filled 2020-01-02 (×3): qty 4

## 2020-01-02 MED ORDER — POLYETHYLENE GLYCOL 3350 17 G PO PACK: 17.0000 g | PACK | Freq: Every day | ORAL | Status: DC | PRN

## 2020-01-02 MED ORDER — SENNOSIDES-DOCUSATE SODIUM 8.6-50 MG PO TABS
1.0000 | ORAL_TABLET | Freq: Two times a day (BID) | ORAL | Status: DC
  Administered 2020-01-02 – 2020-01-04 (×4): 1 via ORAL
  Filled 2020-01-02 (×6): qty 1

## 2020-01-02 MED ORDER — SODIUM CHLORIDE 0.45 % IV SOLN: INTRAVENOUS | Status: AC

## 2020-01-02 MED ORDER — HYDROMORPHONE HCL 2 MG/ML IJ SOLN
2.0000 mg | INTRAMUSCULAR | Status: DC | PRN
  Administered 2020-01-02 (×2): 2 mg via INTRAVENOUS
  Filled 2020-01-02 (×2): qty 1

## 2020-01-02 MED ORDER — NALOXONE HCL 0.4 MG/ML IJ SOLN: 0.4000 mg | INTRAMUSCULAR | Status: DC | PRN

## 2020-01-02 MED ORDER — ONDANSETRON HCL 4 MG/2ML IJ SOLN: 4.0000 mg | Freq: Four times a day (QID) | INTRAMUSCULAR | Status: DC | PRN

## 2020-01-02 NOTE — H&P (Addendum)
H&P  Patient Demographics:  Martin [name], is a 29 y.o. male  MRN: [id number]   DOB - 01/30/1991  Admit Date - 01/02/2020  Outpatient Primary MD for the patient is Kallie LocksStroud, Natalie M, [name]  Chief Complaint  Patient presents with  . Sickle Cell Pain Crisis      HPI:   Martin [name]  is a 29 y.o. male with a medical history significant for sickle cell disease, chronic pain syndrome, opiate dependence and tolerance, history of pulmonary embolism on Eliquis, HIV disease, generalized anxiety disorder, and history of anemia of chronic disease presents to the emergency department with complaints of generalized pain that is consistent with previous sickle cell pain crisis.  Patient states that pain intensity increased several days ago.  Pain attributes the pain crisis to increased stress and changes in weather.  Patient's pain persisted despite taking home Percocet 10-325 mg around every 4 hours.  He denies any fever, chills, headache, dizziness, shortness of breath, urinary symptoms, nausea, vomiting, or diarrhea.  He has had no sick contacts, recent travel, or exposure to COVID-19.  ER course: Vital signs show the following BP 95/61 (BP Location: Left Arm)   Pulse 72   Temp 98.6 F (37 C) (Oral)   Resp 15   Ht 6\' 3"  (1.905 m)   Wt 68 kg   SpO2 94%   BMI 18.75 kg/m .  Hemoglobin is 9.6, appears to be consistent with patient's baseline.  WBCs 13.8.  Total bilirubin elevated at 8.0.  All of the laboratory values are within normal range.  Patient's pain persists despite IV Dilaudid and IV fluids.  Patient admitted to MedSurg for further management of sickle cell pain crisis.   Review of systems:  In addition to the HPI above, patient reports No fever or chills No Headache, No changes with vision or hearing No problems swallowing food or liquids No chest pain, cough or shortness of breath No abdominal pain, No nausea or vomiting, Bowel movements are regular No blood in stool or  urine No dysuria No new skin rashes or bruises No new joints pains-aches No new weakness, tingling, numbness in any extremity No recent weight gain or loss No polyuria, polydypsia or polyphagia No significant Mental Stressors  A full 10 point Review of Systems was done, except as stated above, all other Review of Systems were negative.  With Past History of the following :   Past Medical History:  Diagnosis Date  . Anxiety   . HIV (human immunodeficiency virus infection) (HCC)   . Proteinuria   . Sickle cell crisis (HCC)   . 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HCC)   . Vitamin D deficiency 10/2018      Past Surgical History:  Procedure Laterality Date  . IR IMAGING GUIDED PORT INSERTION  08/29/2019     Social History:   Social History   Tobacco Use  . Smoking status: Never Smoker  . Smokeless tobacco: Never Used  Substance Use Topics  . Alcohol use: No     Lives - At home   Family History :   Family History  Problem Relation Age of Onset  . Sickle cell trait Mother   . Sickle cell trait Father   . Birth defects Maternal Grandmother   . Birth defects Paternal Grandmother      Home Medications:   Prior to Admission medications   Medication Sig Start Date End Date Taking? Authorizing Provider  abacavir-dolutegravir-lamiVUDine (TRIUMEQ) 600-50-300 MG tablet Take 1 tablet by mouth at  bedtime.     [provider]  ALPRAZolam Prudy Feeler) 1 MG tablet Take 1 tablet by mouth 3 times daily as needed for anxiety or sleep. 12/22/19 01/21/20  [name], [name]  apixaban (ELIQUIS) 5 MG TABS tablet Take 1 tablet (5 mg total) by mouth 2 (two) times daily. 08/21/19   Massie Maroon, [name]  cetirizine (ZYRTEC) 10 MG tablet Take 1 tablet (10 mg total) by mouth daily. Patient taking differently: Take 10 mg by mouth daily as needed for allergies.  08/04/19   [name], [name]  guaiFENesin-dextromethorphan (ROBITUSSIN DM) 100-10 MG/5ML syrup Take 5 mLs by mouth every 4 (four) hours as  needed for cough. 11/23/19   Massie Maroon, [name]  hydroxyurea (HYDREA) 500 MG capsule Take 4 capsules (2,000 mg total) by mouth at bedtime. 12/08/19   [name], [name]  Multiple Vitamin (MULTIVITAMIN WITH MINERALS) TABS tablet Take 1 tablet by mouth daily with breakfast.     [provider]  naloxone (NARCAN) 0.4 MG/ML injection As needed. 11/28/18   [name], [name]  oxyCODONE-acetaminophen (PERCOCET) 10-325 MG tablet Take 1 tablet by mouth every 4 (four) hours as needed for pain. 12/22/19   [name], [name]  Vitamin D, Ergocalciferol, (DRISDOL) 1.25 MG (50000 UT) CAPS capsule Take 1 capsule (50,000 Units total) by mouth every 7 (seven) days. Patient taking differently: Take 50,000 Units by mouth every 7 (seven) days. Every Monday 11/01/18   [name], [name]     Allergies:   Allergies  Allergen Reactions  . Tape Rash and Other (See Comments)    PLEASE DO NOT USE THE CLEAR, THICK, "PLASTIC" TAPE- only paper tape is tolerated   . Ketorolac Swelling and Other (See Comments)    Patient reports facial edema and left arm edema after administration.     Physical Exam:   Vitals:   Vitals:   01/02/20 0910 01/02/20 0952  BP: 97/67 98/63  Pulse: 80 77  Resp: 16 16  Temp:    SpO2: 98% 96%    Physical Exam: Constitutional: Patient appears well-developed and well-nourished. Not in obvious distress. HENT: Normocephalic, atraumatic, External right and left ear normal. Oropharynx is clear and moist.  Eyes: Conjunctivae and EOM are normal. PERRLA, no scleral icterus. Neck: Normal ROM. Neck supple. No JVD. No tracheal deviation. No thyromegaly. CVS: RRR, S1/S2 +, no murmurs, no gallops, no carotid bruit.  Pulmonary: Effort and breath sounds normal, no stridor, rhonchi, wheezes, rales.  Abdominal: Soft. BS +, no distension, tenderness, rebound or guarding.  Musculoskeletal: Normal range of motion. No edema and no tenderness.  Lymphadenopathy: No lymphadenopathy  noted, cervical, inguinal or axillary Neuro: Alert. Normal reflexes, muscle tone coordination. No cranial nerve deficit. Skin: Skin is warm and dry. No rash noted. Not diaphoretic. No erythema. No pallor. Psychiatric: Normal mood and affect. Behavior, judgment, thought content normal.   Data Review:   CBC Recent Labs  Lab 01/02/20 0634  WBC 13.8*  HGB 9.6*  HCT 25.6*  PLT 340  MCV 103.6*  MCH 38.9*  MCHC 37.5*  RDW 24.3*  LYMPHSABS 7.3*  MONOABS 1.8*  EOSABS 0.1  BASOSABS 0.1   ------------------------------------------------------------------------------------------------------------------  Chemistries  Recent Labs  Lab 01/02/20 0634  NA 140  K 4.1  CL 106  CO2 23  GLUCOSE 97  BUN 12  CREATININE 0.63  CALCIUM 9.1  AST 52*  ALT 24  ALKPHOS 60  BILITOT 8.0*   ------------------------------------------------------------------------------------------------------------------ estimated creatinine clearance is 131  mL/min (by C-G formula based on SCr of 0.63 mg/dL). ------------------------------------------------------------------------------------------------------------------ No results for input(s): TSH, T4TOTAL, T3FREE, THYROIDAB in the last 72 hours.  Invalid input(s): FREET3  Coagulation profile No results for input(s): INR, PROTIME in the last 168 hours. ------------------------------------------------------------------------------------------------------------------- No results for input(s): DDIMER in the last 72 hours. -------------------------------------------------------------------------------------------------------------------  Cardiac Enzymes No results for input(s): CKMB, TROPONINI, MYOGLOBIN in the last 168 hours.  Invalid input(s): CK ------------------------------------------------------------------------------------------------------------------ No results found for:  BNP  ---------------------------------------------------------------------------------------------------------------  Urinalysis    Component Value Date/Time   COLORURINE AMBER (A) 03/26/2019 0128   APPEARANCEUR CLEAR 03/26/2019 0128   LABSPEC 1.014 03/26/2019 0128   PHURINE 6.0 03/26/2019 0128   GLUCOSEU NEGATIVE 03/26/2019 0128   HGBUR SMALL (A) 03/26/2019 0128   BILIRUBINUR neg 11/27/2019 1149   KETONESUR NEGATIVE 03/26/2019 0128   PROTEINUR Negative 11/27/2019 1149   PROTEINUR NEGATIVE 03/26/2019 0128   UROBILINOGEN >=8.0 (A) 11/27/2019 1149   UROBILINOGEN 4.0 (H) 05/21/2009 1944   NITRITE neg 11/27/2019 1149   NITRITE NEGATIVE 03/26/2019 0128   LEUKOCYTESUR Negative 11/27/2019 1149   LEUKOCYTESUR NEGATIVE 03/26/2019 0128    ----------------------------------------------------------------------------------------------------------------   Imaging Results:    DG Chest 2 View  Result Date: 01/02/2020 CLINICAL DATA:  Hypoxia.  Sickle cell. EXAM: CHEST - 2 VIEW COMPARISON:  11/21/2019 FINDINGS: Chronic pulmonary reticulation/scarring. Stable, normal heart size. Right-sided port with tip in good position. No visible effusion or pneumothorax. IMPRESSION: Pulmonary scarring.  No acute finding. Electronically Signed   By: Marnee Spring M.D.   On: 01/02/2020 07:11     Assessment & Plan:  Principal Problem:   Sickle cell pain crisis (HCC) Active Problems:   Generalized anxiety disorder   HIV (human immunodeficiency virus infection) (HCC)   History of pulmonary embolus (PE)   Chronic, continuous use of opioids   Leukocytosis   Sickle cell disease with pain crisis: Admit to MedSurg. IVF 0.45% saline at 150 mL/h. Start weightbased Dilaudid PCA with settings of 0.5 mg, 10-minute lockout, and 3 mg/h. Hold IV Toradol due to listed allergy Monitor vital signs closely, reevaluate pain scale regularly, and supplemental oxygen as needed.  Patient will be reevaluated for pain in  the context of function and relationship to baseline as care progresses  Sickle cell anemia:  Hemoglobin is 9.6, which is consistent with patient's baseline.  There are no clinical indications for blood transfusion at this time. Continue folic acid and hydroxyurea Repeat CBC in a.m.  Leukocytosis: WBCs mildly elevated at 13.8.  More than likely secondary to vaso-occlusive pain crisis.  Patient is afebrile without any signs of infection or inflammation.  COVID-19 test negative.  Urine culture pending.  Repeat CBC in a.m.  HIV disease: Stable.  Continue Triumeq daily  Generalized anxiety disorder: Stable.  Patient denies any suicidal or homicidal ideations.  Continue alprazolam 0.25 mg 3 times per day as needed.  Chronic pain syndrome: Hold Percocet.  Use Dilaudid PCA as substitute.  History of pulmonary embolism, Continue Eliquis   DVT Prophylaxis: Continue Eliquis and SCDs  AM Labs Ordered, also please review Full Orders  Family Communication: Admission, patient's condition and plan of care including tests being ordered have been discussed with the patient who indicate understanding and agree with the plan and Code Status.  Code Status: Full Code  Consults called: None    Admission status: Inpatient    Time spent in minutes : 35 minutes  Darina Hartwell Rennis Petty  APRN, [name], [name] [facility] [phone]  [address] [place], [place] [address] [phone]  01/02/2020 at 10:01 AM

## 2020-01-02 NOTE — ED Provider Notes (Addendum)
[facility]-EMERGENCY DEPT Provider Note   CSN: [id number] Arrival date & time: 01/01/20  2310     History Chief Complaint  Patient presents with  . Sickle Cell Pain Crisis    Case X Martin [name] is a 29 y.o. [name] history of sickle cell disease, HIV, PE, on Eliquis.  Patient presents today for what he describes as a typical sickle cell pain crisis onset yesterday morning. He describes a diffuse aching/throbbing pain constant severe nonradiating throughout his entire body, no aggravating factors, minimally improved with Percocet. He reports pain throughout his entire body does include some mild chest pain which is normal for his sickle cell crises.  Triage note reports patient found to be hypoxic at 84% on room air and was placed on 2 L nasal cannula. Patient reports that he uses supplemental oxygen, 2 L every night to sleep. He denies any shortness of breath with this sickle cell crisis.  Denies fever/chills, headache, vision changes, neck stiffness, sore throat, hemoptysis, shortness of breath, abdominal pain, nausea/vomiting, extremity swelling/color change or any additional concerns.  HPI     Past Medical History:  Diagnosis Date  . Anxiety   . HIV (human immunodeficiency virus infection) (HCC)   . Proteinuria   . Sickle cell crisis (HCC)   . 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HCC)   . Vitamin D deficiency 10/2018    Patient Active Problem List   Diagnosis Date Noted  . Leukocytosis 01/02/2020  . Anxiety 11/27/2019  . Proteinuria 11/27/2019  . Scleral icterus   . Chronic, continuous use of opioids 08/15/2019  . Seasonal allergies 08/15/2019  . Hypoxia   . Chest congestion   . Sickle cell anemia with crisis (HCC) 08/04/2019  . Other chronic pain 08/04/2019  . History of pulmonary embolus (PE) 08/04/2019  . Acute pulmonary embolism (HCC) 02/10/2019  . Acute chest syndrome due to sickle cell crisis (HCC) 02/10/2019  . Sickle cell crisis (HCC) 09/Martin/2020  .  Heart murmur 02/03/2017  . Sickle cell disease with crisis (HCC) 02/02/2017  . 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 Sickle cell pain crisis (HCC) 02/02/2017  . 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HCC) 12/20/2016  . Vitamin D deficiency 08/14/2016  . High risk medication use 09/27/2014  . Generalized anxiety disorder 05/19/2014  . GERD (gastroesophageal reflux disease) 05/19/2014  . HIV (human immunodeficiency virus infection) (HCC) 05/23/2012    Past Surgical History:  Procedure Laterality Date  . IR IMAGING GUIDED PORT INSERTION  08/29/2019       Family History  Problem Relation Age of Onset  . Sickle cell trait Mother   . Sickle cell trait Father   . Birth defects Maternal Grandmother   . Birth defects Paternal Grandmother     Social History   Tobacco Use  . Smoking status: Never Smoker  . Smokeless tobacco: Never Used  Vaping Use  . Vaping Use: Some days  . Substances: THC  Substance Use Topics  . Alcohol use: No  . Drug use: Yes    Types: Marijuana    Home Medications Prior to Admission medications   Medication Sig Start Date End Date Taking? Authorizing Provider  abacavir-dolutegravir-lamiVUDine (TRIUMEQ) 600-50-300 MG tablet Take 1 tablet by mouth at bedtime.     [provider]  ALPRAZolam Prudy Feeler) 1 MG tablet Take 1 tablet by mouth 3 times daily as needed for anxiety or sleep. 12/22/19 01/21/20  [name], [name]  apixaban (ELIQUIS) 5 MG TABS tablet Take 1 tablet (5 mg total) by mouth 2 (  two) times daily. 08/21/19   Massie MaroonHollis, Lachina M, [name]  cetirizine (ZYRTEC) 10 MG tablet Take 1 tablet (10 mg total) by mouth daily. Patient taking differently: Take 10 mg by mouth daily as needed for allergies.  08/04/19   Kallie LocksStroud, Natalie M, [name]  guaiFENesin-dextromethorphan (ROBITUSSIN DM) 100-10 MG/5ML syrup Take 5 mLs by mouth every 4 (four) hours as needed for cough. 11/23/19   Massie MaroonHollis, Lachina M, [name]  hydroxyurea (HYDREA) 500 MG capsule Take 4 capsules (2,000 mg total) by mouth at  bedtime. 12/08/19   Kallie LocksStroud, Natalie M, [name]  Multiple Vitamin (MULTIVITAMIN WITH MINERALS) TABS tablet Take 1 tablet by mouth daily with breakfast.     [provider]  naloxone (NARCAN) 0.4 MG/ML injection As needed. 11/28/18   Kallie LocksStroud, Natalie M, [name]  oxyCODONE-acetaminophen (PERCOCET) 10-325 MG tablet Take 1 tablet by mouth every 4 (four) hours as needed for pain. 12/22/19   Kallie LocksStroud, Natalie M, [name]  Vitamin D, Ergocalciferol, (DRISDOL) 1.25 MG (50000 UT) CAPS capsule Take 1 capsule (50,000 Units total) by mouth every 7 (seven) days. Patient taking differently: Take 50,000 Units by mouth every 7 (seven) days. Every Monday 11/01/18   Kallie LocksStroud, Natalie M, [name]    Allergies    Tape and Ketorolac  Review of Systems   Review of Systems Ten systems are reviewed and are negative for acute change except as noted in the HPI  Physical Exam Updated Vital Signs BP 98/63   Pulse 77   Temp 98.7 F (37.1 C) (Oral)   Resp 16   Ht 6\' 3"  (1.905 m)   Wt 68 kg   SpO2 96%   BMI 18.75 kg/m   Physical Exam Constitutional:      General: He is not in acute distress.    Appearance: Normal appearance. He is well-developed. He is not ill-appearing or diaphoretic.  HENT:     Head: Normocephalic and atraumatic.     Right Ear: External ear normal.     Left Ear: External ear normal.  Eyes:     General: Vision grossly intact. Gaze aligned appropriately.     Pupils: Pupils are equal, round, and reactive to light.  Neck:     Trachea: Trachea and phonation normal.  Cardiovascular:     Rate and Rhythm: Normal rate and regular rhythm.  Pulmonary:     Effort: Pulmonary effort is normal. No respiratory distress.     Breath sounds: Normal breath sounds.  Abdominal:     General: There is no distension.     Palpations: Abdomen is soft.     Tenderness: There is no abdominal tenderness. There is no guarding or rebound.  Musculoskeletal:        General: Normal range of motion.     Cervical back: Normal range of  motion.     Right lower leg: No edema.     Left lower leg: No edema.  Skin:    General: Skin is warm and dry.  Neurological:     Mental Status: He is alert.     GCS: GCS eye subscore is 4. GCS verbal subscore is 5. GCS motor subscore is 6.     Comments: Speech is clear and goal oriented, follows commands Major Cranial nerves without deficit, no facial droop Moves extremities without ataxia, coordination intact  Psychiatric:        Behavior: Behavior normal.     ED Results / Procedures / Treatments   Labs (all labs ordered are listed, but only abnormal results  are displayed) Labs Reviewed  COMPREHENSIVE METABOLIC PANEL - Abnormal; Notable for the following components:      Result Value   Total Protein 9.6 (*)    AST 52 (*)    Total Bilirubin 8.0 (*)    All other components within normal limits  CBC WITH DIFFERENTIAL/PLATELET - Abnormal; Notable for the following components:   WBC 13.8 (*)    RBC 2.47 (*)    Hemoglobin 9.6 (*)    HCT 25.6 (*)    MCV 103.6 (*)    MCH 38.9 (*)    MCHC 37.5 (*)    RDW 24.3 (*)    nRBC 10.2 (*)    Lymphs Abs 7.3 (*)    Monocytes Absolute 1.8 (*)    All other components within normal limits  RETICULOCYTES - Abnormal; Notable for the following components:   Retic Ct Pct 20.3 (*)    RBC. 2.45 (*)    Retic Count, Absolute 498.0 (*)    Immature Retic Fract 33.3 (*)    All other components within normal limits  RAPID URINE DRUG SCREEN, HOSP PERFORMED  URINALYSIS, ROUTINE W REFLEX MICROSCOPIC  TROPONIN I (HIGH SENSITIVITY)    EKG EKG Interpretation  Date/Time:  Monday January 01 2020 23:27:35 EDT Ventricular Rate:  93 PR Interval:    QRS Duration: 88 QT Interval:  370 QTC Calculation: 461 R Axis:   82 Text Interpretation: Sinus rhythm 12 Lead; Mason-Likar No significant change since prior today Confirmed by Meridee Score [phone]) on 01/02/2020 7:03:18 AM   Radiology DG Chest 2 View  Result Date: 01/02/2020 CLINICAL DATA:   Hypoxia.  Sickle cell. EXAM: CHEST - 2 VIEW COMPARISON:  11/21/2019 FINDINGS: Chronic pulmonary reticulation/scarring. Stable, normal heart size. Right-sided port with tip in good position. No visible effusion or pneumothorax. IMPRESSION: Pulmonary scarring.  No acute finding. Electronically Signed   By: Marnee Spring M.D.   On: 01/02/2020 07:11    Procedures Procedures (including critical care time)  Medications Ordered in ED Medications  HYDROmorphone (DILAUDID) injection 2 mg (has no administration in time range)  0.45 % sodium chloride infusion (has no administration in time range)  HYDROmorphone (DILAUDID) injection 2 mg (2 mg Intravenous Given 01/02/20 0745)  diphenhydrAMINE (BENADRYL) injection 12.5 mg (12.5 mg Intravenous Given 01/02/20 0745)  HYDROmorphone (DILAUDID) injection 2 mg (2 mg Intravenous Given 01/02/20 1751)    ED Course  I have reviewed the triage vital signs and the nursing notes.  Pertinent labs & imaging results that were available during my care of the patient were reviewed by me and considered in my medical decision making (see chart for details).    MDM Rules/Calculators/A&P                          Additional history obtained from: 1. Nursing notes from this visit. 2. Electronic medical record. -------------------------- Martin [name] presented for sickle cell pain crisis that began yesterday morning.  He describes it as his typical pain without abnormal feature.  Pain does include chest but he has no cough/hemoptysis, shortness of breath, fever or other symptoms to suggest acute chest syndrome.  Chest pain work-up was initiated in triage.  Patient was noted to be hypoxic in the waiting room, he reports he is supposed to use supplemental oxygen at night.  He reports compliance with his Eliquis therapy.  Will follow chest pain work-up, treat pain and monitor patient. - I reviewed and interpreted labs which include:  High-sensitivity troponin within normal  limits, no indication for delta troponin. CBC shows baseline anemia of 9.6, mild leukocytosis at 13.8. Reticulocytes elevated consistent with prior. CMP shows no emergent electrolyte derangement, AKI, gap.  Total bilirubin 8.0, slightly increased from 7.3 prior.  CXR:    IMPRESSION:  Pulmonary scarring. No acute finding.   EKG: Sinus rhythm 12 Lead; Mason-Likar No significant change since prior today Confirmed by Meridee Score [phone]) on 01/02/2020 7:03:18 AM - 8:17 AM: Work-up above is reassuring, consistent with sickle pain crises, no evidence for acute chest syndrome or other acute cardiopulmonary etiologies at this time.  Patient was reassessed he is resting comfortably in bed no acute distress reports pain is somewhat improved but requesting a second dose of pain medication which has been ordered.  He is also requesting a sandwich and a Coke. Will continue to monitor. - 9:05 AM: Patient reassessed, VSS, NAD. - 9:30 AM: Discussed case with Hart Rochester NP sickle cell team, advises sickle cell clinic is capped for today. Hollis NP advises will admit patient for observation. - Patient admitted to sickle cell team. Case was discussed wit Dr. Charm Barges during this visit, agrees with workup and plan.  Note: Portions of this report may have been transcribed using voice recognition software. Every effort was made to ensure accuracy; however, inadvertent computerized transcription errors may still be present. Final Clinical Impression(s) / ED Diagnoses Final diagnoses:  Sickle cell crisis [facility])    Rx / DC Orders ED Discharge Orders    None        Elizabeth Palau 01/02/20 1019    Terrilee Files, MD 01/02/20 1800

## 2020-01-03 LAB — CBC
HCT: 22.3 % — ABNORMAL LOW (ref 39.0–52.0)
Hemoglobin: 8.5 g/dL — ABNORMAL LOW (ref 13.0–17.0)
MCH: 39.7 pg — ABNORMAL HIGH (ref 26.0–34.0)
MCHC: 38.1 g/dL — ABNORMAL HIGH (ref 30.0–36.0)
MCV: 104.2 fL — ABNORMAL HIGH (ref 80.0–100.0)
Platelets: 259 10*3/uL (ref 150–400)
RBC: 2.14 MIL/uL — ABNORMAL LOW (ref 4.22–5.81)
RDW: 24 % — ABNORMAL HIGH (ref 11.5–15.5)
WBC: 12.9 10*3/uL — ABNORMAL HIGH (ref 4.0–10.5)
nRBC: 7.8 % — ABNORMAL HIGH (ref 0.0–0.2)

## 2020-01-03 LAB — BASIC METABOLIC PANEL
Anion gap: 4 — ABNORMAL LOW (ref 5–15)
BUN: 9 mg/dL (ref 6–20)
CO2: 25 mmol/L (ref 22–32)
Calcium: 8.6 mg/dL — ABNORMAL LOW (ref 8.9–10.3)
Chloride: 108 mmol/L (ref 98–111)
Creatinine, Ser: 0.69 mg/dL (ref 0.61–1.24)
GFR calc Af Amer: >60 mL/min (ref >60)
GFR calc non Af Amer: >60 mL/min (ref >60)
Glucose, Bld: 88 mg/dL (ref 70–99)
Potassium: 4.9 mmol/L (ref 3.5–5.1)
Sodium: 137 mmol/L (ref 135–145)

## 2020-01-03 LAB — URINE CULTURE: Culture: NO GROWTH

## 2020-01-03 MED ORDER — FLEET ENEMA 7-19 GM/118ML RE ENEM
1.0000 | ENEMA | Freq: Every day | RECTAL | Status: DC | PRN
  Administered 2020-01-03 – 2020-01-04 (×2): 1 via RECTAL
  Filled 2020-01-03 (×2): qty 1

## 2020-01-03 MED ORDER — HYDROMORPHONE 1 MG/ML IV SOLN
INTRAVENOUS | Status: DC
  Administered 2020-01-03: 9.8 mg via INTRAVENOUS
  Administered 2020-01-04: 7 mg via INTRAVENOUS
  Administered 2020-01-04: 3.5 mg via INTRAVENOUS
  Administered 2020-01-04: 4.5 mg via INTRAVENOUS
  Administered 2020-01-04: 30 mg via INTRAVENOUS
  Administered 2020-01-04: 3.5 mg via INTRAVENOUS
  Administered 2020-01-04: 30 mg via INTRAVENOUS
  Administered 2020-01-04: 4.5 mg via INTRAVENOUS
  Administered 2020-01-04: 6 mg via INTRAVENOUS
  Administered 2020-01-05: 7.5 mg via INTRAVENOUS
  Administered 2020-01-05: 1.5 mg via INTRAVENOUS
  Administered 2020-01-05: 1 mg via INTRAVENOUS
  Filled 2020-01-03 (×2): qty 30

## 2020-01-03 MED ORDER — LIP MEDEX EX OINT
1.0000 "application " | TOPICAL_OINTMENT | CUTANEOUS | Status: DC | PRN
  Filled 2020-01-03: qty 7

## 2020-01-03 MED ORDER — OXYCODONE HCL 5 MG PO TABS
10.0000 mg | ORAL_TABLET | ORAL | Status: DC | PRN
  Administered 2020-01-03 – 2020-01-05 (×8): 10 mg via ORAL
  Filled 2020-01-03 (×8): qty 2

## 2020-01-03 NOTE — Progress Notes (Signed)
Subjective: Martin [name] is a 29 year old male with a medical history significant for sickle cell disease, chronic pain syndrome, opiate dependence and tolerance, HIV disease, generalized anxiety, and history of anemia of chronic disease was admitted for sickle cell pain crisis. Patient says that pain has not improved overnight.  Pain is primarily to low back and lower extremities.  Patient rates pain as 9/10.  He denies any headache, chest pain, urinary symptoms, nausea, vomiting, or diarrhea.  He endorses some constipation.  Objective:  Vital signs in last 24 hours:  Vitals:   01/03/20 0500 01/03/20 0600 01/03/20 1015 01/03/20 1112  BP:   104/73   Pulse:   70   Resp: 14  11 12   Temp:   97.9 F (36.6 C)   TempSrc:   Oral   SpO2: 100%  100% 90%  Weight:  68.4 kg    Height:        Intake/Output from previous day:   Intake/Output Summary (Last 24 hours) at 01/03/2020 1214 Last data filed at 01/03/2020 1146 Gross per 24 hour  Intake 257 ml  Output 1550 ml  Net -1293 ml    Physical Exam: General: Alert, awake, oriented x3, in no acute distress.  HEENT: [place]/AT PEERL, EOMI Neck: Trachea midline,  no masses, no thyromegal,y no JVD, no carotid bruit OROPHARYNX:  Moist, No exudate/ erythema/lesions.  Heart: Regular rate and rhythm, without murmurs, rubs, gallops, PMI non-displaced, no heaves or thrills on palpation.  Lungs: Clear to auscultation, no wheezing or rhonchi noted. No increased vocal fremitus resonant to percussion  Abdomen: Soft, nontender, nondistended, positive bowel sounds, no masses no hepatosplenomegaly noted..  Neuro: No focal neurological deficits noted cranial nerves II through XII grossly intact. DTRs 2+ bilaterally upper and lower extremities. Strength 5 out of 5 in bilateral upper and lower extremities. Musculoskeletal: No warm swelling or erythema around joints, no spinal tenderness noted. Psychiatric: Patient alert and oriented x3, good insight and  cognition, good recent to remote recall. Lymph node survey: No cervical axillary or inguinal lymphadenopathy noted.  Lab Results:  Basic Metabolic Panel:    Component Value Date/Time   NA 137 01/03/2020 0533   NA 136 10/17/2018 1433   K 4.9 01/03/2020 0533   CL 108 01/03/2020 0533   CO2 25 01/03/2020 0533   BUN 9 01/03/2020 0533   BUN 6 10/17/2018 1433   CREATININE 0.69 01/03/2020 0533   GLUCOSE 88 01/03/2020 0533   CALCIUM 8.6 (L) 01/03/2020 0533   CBC:    Component Value Date/Time   WBC 12.9 (H) 01/03/2020 0533   HGB 8.5 (L) 01/03/2020 0533   HGB 9.6 (L) 10/17/2018 1433   HCT 22.3 (L) 01/03/2020 0533   HCT 26.2 (L) 10/17/2018 1433   PLT 259 01/03/2020 0533   PLT 297 10/17/2018 1433   MCV 104.2 (H) 01/03/2020 0533   MCV 108 (H) 10/17/2018 1433   NEUTROABS 4.5 01/02/2020 0634   NEUTROABS 7.0 10/17/2018 1433   LYMPHSABS 7.3 (H) 01/02/2020 0634   LYMPHSABS 4.6 (H) 10/17/2018 1433   MONOABS 1.8 (H) 01/02/2020 0634   EOSABS 0.1 01/02/2020 0634   EOSABS 0.2 10/17/2018 1433   BASOSABS 0.1 01/02/2020 0634   BASOSABS 0.1 10/17/2018 1433    Recent Results (from the past 240 hour(s))  SARS CORONAVIRUS 2 (TAT 6-24 HRS) Nasopharyngeal Nasopharyngeal Swab     Status: None   Collection Time: 01/02/20  1:24 PM   Specimen: Nasopharyngeal Swab  Result Value Ref Range Status   SARS  Coronavirus 2 NEGATIVE NEGATIVE Final    Comment: (NOTE) SARS-CoV-2 target nucleic acids are NOT DETECTED.  The SARS-CoV-2 RNA is generally detectable in upper and lower respiratory specimens during the acute phase of infection. Negative results do not preclude SARS-CoV-2 infection, do not rule out co-infections with other pathogens, and should not be used as the sole basis for treatment or other patient management decisions. Negative results must be combined with clinical observations, patient history, and epidemiological information. The expected result is Negative.  Fact Sheet for  Patients: HairSlick.no  Fact Sheet for Healthcare Providers: quierodirigir.com  This test is not yet approved or cleared by the [place] FDA and  has been authorized for detection and/or diagnosis of SARS-CoV-2 by FDA under an Emergency Use Authorization (EUA). This EUA will remain  in effect (meaning this test can be used) for the duration of the COVID-19 declaration under Se ction 564(b)(1) of the Act, 21 U.S.C. section 360bbb-3(b)(1), unless the authorization is terminated or revoked sooner.  Performed at [facility], [address] [place], [place] [address]     Studies/Results: DG Chest 2 View  Result Date: 01/02/2020 CLINICAL DATA:  Hypoxia.  Sickle cell. EXAM: CHEST - 2 VIEW COMPARISON:  11/21/2019 FINDINGS: Chronic pulmonary reticulation/scarring. Stable, normal heart size. Right-sided port with tip in good position. No visible effusion or pneumothorax. IMPRESSION: Pulmonary scarring.  No acute finding. Electronically Signed   By: Marnee Spring M.D.   On: 01/02/2020 07:11    Medications: Scheduled Meds: . abacavir-dolutegravir-lamiVUDine  1 tablet Oral QHS  . apixaban  5 mg Oral BID  . Chlorhexidine Gluconate Cloth  6 each Topical Daily  . HYDROmorphone   Intravenous Q4H  . hydroxyurea  2,000 mg Oral QHS  . loratadine  10 mg Oral Daily  . multivitamin with minerals  1 tablet Oral Q breakfast  . senna-docusate  1 tablet Oral BID   Continuous Infusions: PRN Meds:.ALPRAZolam, diphenhydrAMINE, naloxone **AND** sodium chloride flush, ondansetron (ZOFRAN) IV, oxyCODONE, polyethylene glycol, sodium phosphate  Consultants:  None  Procedures:  None  Antibiotics:  None  Assessment/Plan: Principal Problem:   Sickle cell pain crisis (HCC) Active Problems:   Generalized anxiety disorder   HIV (human immunodeficiency virus infection) (HCC)   History of pulmonary embolus (PE)   Chronic, continuous  use of opioids   Leukocytosis  Sickle cell disease with pain crisis: Continue IV Dilaudid PCA Oxycodone 10 mg every 4 hours for severe breakthrough pain Monitor vital signs closely, reevaluate pain scale regularly, and supplemental oxygen as needed.  Sickle cell anemia: Hemoglobin is stable and consistent with patient's baseline.  There is no clinical indication for blood transfusion at this time.  Continue to follow closely.  Leukocytosis: WBCs improving.  More than likely secondary to vaso-occlusive pain crisis.  Patient continues to be afebrile without any signs of infection or inflammation.  Continue to follow closely.  Repeat CBC in a.m.  HIV disease: Stable.  Continue home medications.  Generalized anxiety disorder: Stable.  Patient denies any suicidal homicidal ideations.  Continue home medications as needed  Chronic pain syndrome: Continue home medications  History of pulmonary embolism: Continue Eliquis  Code Status: Full Code Family Communication: N/A Disposition Plan: Not yet ready for discharge  Alontae Chaloux Rennis Petty  APRN, [name], [name] [facility] [address] [place], [place] [address] [phone]  If 5PM-8AM, please contact night-coverage.  01/03/2020, 12:14 PM  LOS: 1 day

## 2020-01-04 DIAGNOSIS — F411 Generalized anxiety disorder: Secondary | ICD-10-CM

## 2020-01-04 DIAGNOSIS — Z21 Asymptomatic human immunodeficiency virus [HIV] infection status: Secondary | ICD-10-CM

## 2020-01-04 DIAGNOSIS — Z86711 Personal history of pulmonary embolism: Secondary | ICD-10-CM

## 2020-01-04 NOTE — Progress Notes (Signed)
Subjective: Martin [name] is a 29 year old male with a medical history significant for sickle cell disease, chronic pain syndrome, opiate dependence and tolerance, HIV disease, generalized anxiety, and history of anemia of chronic disease was admitted for sickle cell pain crisis.  Patient states that pain has improved some and he has no new complaints on today.  He rates pain as 6/10.  He says that he cannot function at home at this time, his goal is 3/10. He denies any headache, chest pain, shortness of breath, urinary symptoms, nausea, vomiting, or diarrhea.  Objective:  Vital signs in last 24 hours:  Vitals:   01/04/20 1544 01/04/20 1948 01/04/20 2332 01/05/20 0523  BP:      Pulse:      Resp: 15 15 15 15   Temp:      TempSrc:      SpO2: 92% 92% 92% 92%  Weight:      Height:        Intake/Output from previous day:   Intake/Output Summary (Last 24 hours) at 01/05/2020 0655 Last data filed at 01/04/2020 1938 Gross per 24 hour  Intake --  Output 625 ml  Net -625 ml    Physical Exam: General: Alert, awake, oriented x3, in no acute distress.  HEENT: [place]/AT PEERL, EOMI Neck: Trachea midline,  no masses, no thyromegal,y no JVD, no carotid bruit OROPHARYNX:  Moist, No exudate/ erythema/lesions.  Heart: Regular rate and rhythm, without murmurs, rubs, gallops, PMI non-displaced, no heaves or thrills on palpation.  Lungs: Clear to auscultation, no wheezing or rhonchi noted. No increased vocal fremitus resonant to percussion  Abdomen: Soft, nontender, nondistended, positive bowel sounds, no masses no hepatosplenomegaly noted..  Neuro: No focal neurological deficits noted cranial nerves II through XII grossly intact. DTRs 2+ bilaterally upper and lower extremities. Strength 5 out of 5 in bilateral upper and lower extremities. Musculoskeletal: No warm swelling or erythema around joints, no spinal tenderness noted. Psychiatric: Patient alert and oriented x3, good insight and cognition,  good recent to remote recall. Lymph node survey: No cervical axillary or inguinal lymphadenopathy noted.  Lab Results:  Basic Metabolic Panel:    Component Value Date/Time   NA 137 01/03/2020 0533   NA 136 10/17/2018 1433   K 4.9 01/03/2020 0533   CL 108 01/03/2020 0533   CO2 25 01/03/2020 0533   BUN 9 01/03/2020 0533   BUN 6 10/17/2018 1433   CREATININE 0.69 01/03/2020 0533   GLUCOSE 88 01/03/2020 0533   CALCIUM 8.6 (L) 01/03/2020 0533   CBC:    Component Value Date/Time   WBC 12.9 (H) 01/03/2020 0533   HGB 8.5 (L) 01/03/2020 0533   HGB 9.6 (L) 10/17/2018 1433   HCT 22.3 (L) 01/03/2020 0533   HCT 26.2 (L) 10/17/2018 1433   PLT 259 01/03/2020 0533   PLT 297 10/17/2018 1433   MCV 104.2 (H) 01/03/2020 0533   MCV 108 (H) 10/17/2018 1433   NEUTROABS 4.5 01/02/2020 0634   NEUTROABS 7.0 10/17/2018 1433   LYMPHSABS 7.3 (H) 01/02/2020 0634   LYMPHSABS 4.6 (H) 10/17/2018 1433   MONOABS 1.8 (H) 01/02/2020 0634   EOSABS 0.1 01/02/2020 0634   EOSABS 0.2 10/17/2018 1433   BASOSABS 0.1 01/02/2020 0634   BASOSABS 0.1 10/17/2018 1433    Recent Results (from the past 240 hour(s))  SARS CORONAVIRUS 2 (TAT 6-24 HRS) Nasopharyngeal Nasopharyngeal Swab     Status: None   Collection Time: 01/02/20  1:24 PM   Specimen: Nasopharyngeal Swab  Result Value  Ref Range Status   SARS Coronavirus 2 NEGATIVE NEGATIVE Final    Comment: (NOTE) SARS-CoV-2 target nucleic acids are NOT DETECTED.  The SARS-CoV-2 RNA is generally detectable in upper and lower respiratory specimens during the acute phase of infection. Negative results do not preclude SARS-CoV-2 infection, do not rule out co-infections with other pathogens, and should not be used as the sole basis for treatment or other patient management decisions. Negative results must be combined with clinical observations, patient history, and epidemiological information. The expected result is Negative.  Fact Sheet for  Patients: HairSlick.no  Fact Sheet for Healthcare Providers: quierodirigir.com  This test is not yet approved or cleared by the [place] FDA and  has been authorized for detection and/or diagnosis of SARS-CoV-2 by FDA under an Emergency Use Authorization (EUA). This EUA will remain  in effect (meaning this test can be used) for the duration of the COVID-19 declaration under Se ction 564(b)(1) of the Act, 21 U.S.C. section 360bbb-3(b)(1), unless the authorization is terminated or revoked sooner.  Performed at [facility], [address], [place], [place] [address]   Urine culture     Status: None   Collection Time: 01/02/20  4:36 PM   Specimen: Urine, Random  Result Value Ref Range Status   Specimen Description   Final    URINE, RANDOM Performed at [facility], [address], [place], [place] [address]    Special Requests   Final    NONE Performed at [facility], [address] [place], [place] [address]    Culture   Final    NO GROWTH Performed at [facility], [address], [place], [place] [address]    Report Status 01/03/2020 FINAL  Final    Studies/Results: No results found.  Medications: Scheduled Meds: . abacavir-dolutegravir-lamiVUDine  1 tablet Oral QHS  . apixaban  5 mg Oral BID  . Chlorhexidine Gluconate Cloth  6 each Topical Daily  . HYDROmorphone   Intravenous Q4H  . hydroxyurea  2,000 mg Oral QHS  . loratadine  10 mg Oral Daily  . multivitamin with minerals  1 tablet Oral Q breakfast  . senna-docusate  1 tablet Oral BID   Continuous Infusions: PRN Meds:.ALPRAZolam, diphenhydrAMINE, lip balm, naloxone **AND** sodium chloride flush, ondansetron (ZOFRAN) IV, oxyCODONE, polyethylene glycol, sodium phosphate  Consultants:  None  Procedures:  None  Antibiotics:  None  Assessment/Plan: Principal Problem:   Sickle cell pain crisis  (HCC) Active Problems:   Generalized anxiety disorder   HIV (human immunodeficiency virus infection) (HCC)   History of pulmonary embolus (PE)   Chronic, continuous use of opioids   Leukocytosis Sickle cell disease with pain crisis: Weaning IV Dilaudid PCA. Continue oxycodone 10 mg every 4 hours for severe breakthrough pain Monitor vital signs closely, reevaluate pain scale regularly, and supplemental oxygen as needed.  Sickle cell anemia: Hemoglobin is stable and consistent with patient's baseline.  There is no clinical indication for blood transfusion at this time.  Continue to follow closely.  Leukocytosis: Stable.  Patient continues to be afebrile without any signs of infection or inflammation.  Continue to monitor closely without antibiotics.  Follow CBC in a.m.  HIV disease: Stable.  Continue home medications.  Generalized anxiety disorder: Stable.  Patient denies any suicidal or homicidal ideations.  Continue home medications as needed.  Chronic pain syndrome: Continue home medications  History of pulmonary embolism: Continue Eliquis   Code Status: Full Code Family Communication:  N/A Disposition Plan: Not yet ready for discharge   [name]  APRN, [name], [name] [facility] [address] [place], [place] [address] [phone]  If 5PM-7AM, please contact night-coverage.  01/05/2020, 6:55 AM  LOS: 3 days

## 2020-01-05 ENCOUNTER — Other Ambulatory Visit: Payer: Self-pay | Admitting: Internal Medicine

## 2020-01-05 DIAGNOSIS — G8929 Other chronic pain: Secondary | ICD-10-CM

## 2020-01-05 DIAGNOSIS — D571 Sickle-cell disease without crisis: Secondary | ICD-10-CM

## 2020-01-05 MED ORDER — OXYCODONE-ACETAMINOPHEN 10-325 MG PO TABS: 1.0000 | ORAL_TABLET | ORAL | 0 refills | Status: DC | PRN

## 2020-01-05 MED ORDER — HEPARIN SOD (PORK) LOCK FLUSH 100 UNIT/ML IV SOLN
500.0000 [IU] | INTRAVENOUS | Status: AC | PRN
  Administered 2020-01-05: 500 [IU]
  Filled 2020-01-05: qty 5

## 2020-01-05 MED ORDER — SODIUM CHLORIDE 0.9% FLUSH: 10.0000 mL | INTRAVENOUS | Status: DC | PRN

## 2020-01-05 NOTE — Plan of Care (Signed)
Pt VS WNL this am. Reporting pain 5/10 at this time.  No further complaints.   Problem: Education: Goal: Knowledge of vaso-occlusive preventative measures will improve Outcome: Progressing Goal: Awareness of infection prevention will improve Outcome: Progressing Goal: Awareness of signs and symptoms of anemia will improve Outcome: Progressing Goal: Long-term complications will improve Outcome: Progressing   Problem: Self-Care: Goal: Ability to incorporate actions that prevent/reduce pain crisis will improve Outcome: Progressing   Problem: Respiratory: Goal: Pulmonary complications will be avoided or minimized Outcome: Progressing Goal: Acute Chest Syndrome will be identified early to prevent complications Outcome: Progressing   Problem: Sensory: Goal: Pain level will decrease with appropriate interventions Outcome: Progressing   Problem: Health Behavior: Goal: Postive changes in compliance with treatment and prescription regimens will improve Outcome: Progressing

## 2020-01-05 NOTE — Progress Notes (Signed)
Pt VS WNL.  Discharge instructions provided to and reviewed with pt.  Port deaccessed.  Pt ambulated to front lobby exit.

## 2020-01-05 NOTE — Discharge Summary (Signed)
Physician Discharge Summary  Martin [name] SVX:[id number] DOB: 1990/07/29 DOA: 01/02/2020  PCP: [name], [name]  Admit date: 01/02/2020  Discharge date: 01/05/2020  Discharge Diagnoses:  Principal Problem:   Sickle cell pain crisis (HCC) Active Problems:   Generalized anxiety disorder   HIV (human immunodeficiency virus infection) (HCC)   History of pulmonary embolus (PE)   Chronic, continuous use of opioids   Leukocytosis  Discharge Condition: Stable  Disposition:   Follow-up Information    [name], [name]. Schedule an appointment as soon as possible for a visit in 1 week(s).   Specialty: Family Medicine Contact information: [address] [place] [address] [phone]              Pt is discharged home in good condition and is to follow up with [name], [name] this week to have labs evaluated. Martin [name] is instructed to increase activity slowly and balance with rest for the next few days, and use prescribed medication to complete treatment of pain  Diet: Regular Wt Readings from Last 3 Encounters:  01/03/20 68.4 kg  12/12/19 66 kg  11/27/19 65.8 kg    History of present illness:  Martin [name]  is a 29 y.o. male with a medical history significant for sickle cell disease, chronic pain syndrome, opiate dependence and tolerance, history of pulmonary embolism on Eliquis, HIV disease, generalized anxiety disorder, and history of anemia of chronic disease presents to the emergency department with complaints of generalized pain that is consistent with previous sickle cell pain crisis.  Patient states that pain intensity increased several days ago.  Pain attributes the pain crisis to increased stress and changes in weather.  Patient's pain persisted despite taking home Percocet 10-325 mg around every 4 hours.  He denies any fever, chills, headache, dizziness, shortness of breath, urinary symptoms, nausea, vomiting, or diarrhea.  He  has had no sick contacts, recent travel, or exposure to COVID-19.  ER course: Vital signs show the following BP 95/61 (BP Location: Left Arm)   Pulse 72   Temp 98.6 F (37 C) (Oral)   Resp 15   Ht 6\' 3"  (1.905 m)   Wt 68 kg   SpO2 94%   BMI 18.75 kg/m .  Hemoglobin is 9.6, appears to be consistent with patient's baseline.  WBCs 13.8. Total bilirubin elevated at 8.0.  All of the laboratory values are within normal range. Patient's pain persists despite IV Dilaudid and IV fluids.  Patient admitted to MedSurg for further management of sickle cell pain crisis.  Hospital Course:  Patient was admitted for sickle cell pain crisis and managed appropriately with IVF, IV Dilaudid via PCA, as well as other adjunct therapies per sickle cell pain management protocols.  Toradol is contraindicated due to allergy.  Patient slowly improved on above regimen, pain slowly returned to baseline, hemoglobin remained stable at baseline, patient did not required blood transfusion during this admission. HIV disease is stable with no symptoms.  Patient was continued on his Eliquis for his history of pulmonary embolism.  Patient remained hemodynamically stable throughout this admission.  As at today patient is eating well, drinking well, ambulating well with no significant pain. Patient was therefore discharged home today in a hemodynamically stable condition.   Martin [name] will follow-up with PCP within 1 week of this discharge. Martin [name] was counseled extensively about nonpharmacologic means of pain management, patient verbalized understanding and was appreciative of  the care received during this admission.  We discussed the need for good hydration, monitoring of hydration status, avoidance of heat, cold, stress, and infection triggers. We discussed the need to be adherent with taking Hydrea and other home medications. Patient was reminded of the need to seek medical attention immediately if any symptom of bleeding,  anemia, or infection occurs.  Discharge Exam: Vitals:   01/05/20 0801 01/05/20 1035  BP:  109/76  Pulse:  67  Resp: 12 14  Temp:  98.2 F (36.8 C)  SpO2: 97% 97%   Vitals:   01/04/20 2332 01/05/20 0523 01/05/20 0801 01/05/20 1035  BP:    109/76  Pulse:    67  Resp: 15 15 12 14   Temp:    98.2 F (36.8 C)  TempSrc:    Oral  SpO2: 92% 92% 97% 97%  Weight:      Height:        General appearance : Awake, alert, not in any distress. Speech Clear. Not toxic looking HEENT: Atraumatic and Normocephalic, pupils equally reactive to light and accomodation Neck: Supple, no JVD. No cervical lymphadenopathy.  Chest: Good air entry bilaterally, no added sounds  CVS: S1 S2 regular, no murmurs.  Abdomen: Bowel sounds present, Non tender and not distended with no gaurding, rigidity or rebound. Extremities: B/L Lower Ext shows no edema, both legs are warm to touch Neurology: Awake alert, and oriented X 3, CN II-XII intact, Non focal Skin: No Rash  Discharge Instructions  Discharge Instructions    Diet - low sodium heart healthy   Complete by: As directed    Increase activity slowly   Complete by: As directed      Allergies as of 01/05/2020      Reactions   Tape Rash, Other (See Comments)   PLEASE DO NOT USE THE CLEAR, THICK, "PLASTIC" TAPE- only paper tape is tolerated    Ketorolac Swelling, Other (See Comments)   Patient reports facial edema and left arm edema after administration.      Medication List    TAKE these medications   ALPRAZolam 1 MG tablet Commonly known as: XANAX Take 1 tablet by mouth 3 times daily as needed for anxiety or sleep.   apixaban 5 MG Tabs tablet Commonly known as: Eliquis Take 1 tablet (5 mg total) by mouth 2 (two) times daily.   cetirizine 10 MG tablet Commonly known as: ZYRTEC Take 1 tablet (10 mg total) by mouth daily. What changed:   when to take this  reasons to take this   guaiFENesin-dextromethorphan 100-10 MG/5ML syrup Commonly  known as: ROBITUSSIN DM Take 5 mLs by mouth every 4 (four) hours as needed for cough.   hydroxyurea 500 MG capsule Commonly known as: HYDREA Take 4 capsules (2,000 mg total) by mouth at bedtime.   multivitamin with minerals Tabs tablet Take 1 tablet by mouth daily with breakfast.   naloxone 0.4 MG/ML injection Commonly known as: NARCAN As needed.   oxyCODONE-acetaminophen 10-325 MG tablet Commonly known as: PERCOCET Take 1 tablet by mouth every 4 (four) hours as needed for pain.   Triumeq 600-50-300 MG tablet Generic drug: abacavir-dolutegravir-lamiVUDine Take 1 tablet by mouth at bedtime.   Vitamin D (Ergocalciferol) 1.25 MG (50000 UNIT) Caps capsule Commonly known as: DRISDOL Take 1 capsule (50,000 Units total) by mouth every 7 (seven) days. What changed: additional instructions       The results of significant diagnostics from this hospitalization (including imaging, microbiology, ancillary and laboratory) are listed below for reference.    Significant  Diagnostic Studies: DG Chest 2 View  Result Date: 01/02/2020 CLINICAL DATA:  Hypoxia.  Sickle cell. EXAM: CHEST - 2 VIEW COMPARISON:  11/21/2019 FINDINGS: Chronic pulmonary reticulation/scarring. Stable, normal heart size. Right-sided port with tip in good position. No visible effusion or pneumothorax. IMPRESSION: Pulmonary scarring.  No acute finding. Electronically Signed   By: Marnee Spring M.D.   On: 01/02/2020 07:11    Microbiology: Recent Results (from the past 240 hour(s))  SARS CORONAVIRUS 2 (TAT 6-24 HRS) Nasopharyngeal Nasopharyngeal Swab     Status: None   Collection Time: 01/02/20  1:24 PM   Specimen: Nasopharyngeal Swab  Result Value Ref Range Status   SARS Coronavirus 2 NEGATIVE NEGATIVE Final    Comment: (NOTE) SARS-CoV-2 target nucleic acids are NOT DETECTED.  The SARS-CoV-2 RNA is generally detectable in upper and lower respiratory specimens during the acute phase of infection. Negative results do  not preclude SARS-CoV-2 infection, do not rule out co-infections with other pathogens, and should not be used as the sole basis for treatment or other patient management decisions. Negative results must be combined with clinical observations, patient history, and epidemiological information. The expected result is Negative.  Fact Sheet for Patients: HairSlick.no  Fact Sheet for Healthcare Providers: quierodirigir.com  This test is not yet approved or cleared by the [place] FDA and  has been authorized for detection and/or diagnosis of SARS-CoV-2 by FDA under an Emergency Use Authorization (EUA). This EUA will remain  in effect (meaning this test can be used) for the duration of the COVID-19 declaration under Se ction 564(b)(1) of the Act, 21 U.S.C. section 360bbb-3(b)(1), unless the authorization is terminated or revoked sooner.  Performed at [facility], [address] [place], [place] [address]   Urine culture     Status: None   Collection Time: 01/02/20  4:36 PM   Specimen: Urine, Random  Result Value Ref Range Status   Specimen Description   Final    URINE, RANDOM Performed at [facility], [address] [place], [place] [address]    Special Requests   Final    NONE Performed at [facility], [address], [place], [place] [address]    Culture   Final    NO GROWTH Performed at [facility], [address], [place], [place] [address]    Report Status 01/03/2020 FINAL  Final     Labs: Basic Metabolic Panel: Recent Labs  Lab 01/02/20 0634 01/03/20 0533  NA 140 137  K 4.1 4.9  CL 106 108  CO2 23 25  GLUCOSE 97 88  BUN 12 9  CREATININE 0.63 0.69  CALCIUM 9.1 8.6*   Liver Function Tests: Recent Labs  Lab 01/02/20 0634  AST 52*  ALT 24  ALKPHOS 60  BILITOT 8.0*  PROT 9.6*  ALBUMIN 4.0   No results for input(s): LIPASE, AMYLASE in the last  168 hours. No results for input(s): AMMONIA in the last 168 hours. CBC: Recent Labs  Lab 01/02/20 0634 01/03/20 0533  WBC 13.8* 12.9*  NEUTROABS 4.5  --   HGB 9.6* 8.5*  HCT 25.6* 22.3*  MCV 103.6* 104.2*  PLT 340 259   Cardiac Enzymes: No results for input(s): CKTOTAL, CKMB, CKMBINDEX, TROPONINI in the last 168 hours. BNP: Invalid input(s): POCBNP CBG: No results for input(s): GLUCAP in the last 168 hours.  Time coordinating discharge: 50 minutes  Signed:  Garv Kuechle  [facility] 01/05/2020, 10:47 AM

## 2020-01-10 ENCOUNTER — Telehealth (HOSPITAL_COMMUNITY): Payer: Self-pay | Admitting: General Practice

## 2020-01-10 NOTE — Telephone Encounter (Signed)
Patient called requesting to come to the day hospital due to 10/10 generalized pain. Per provider, the [facility] does not have the capacity to treat the patient today. Patient told to treat with prescribed pain medications, go to the ER if the pain is unbearable or call back tomorrow morning. Patient verbalized understanding.

## 2020-01-16 ENCOUNTER — Non-Acute Institutional Stay (HOSPITAL_COMMUNITY)
Admission: RE | Admit: 2020-01-16 | Discharge: 2020-01-16 | Disposition: A | Discharge status: Home / Self Care | Payer: Medicaid Other | Source: Ambulatory Visit | Attending: Internal Medicine | Admitting: Internal Medicine

## 2020-01-16 ENCOUNTER — Telehealth: Payer: Self-pay | Admitting: Family Medicine

## 2020-01-16 ENCOUNTER — Encounter: Payer: Self-pay | Admitting: Family Medicine

## 2020-01-16 ENCOUNTER — Encounter (HOSPITAL_COMMUNITY): Payer: Self-pay | Admitting: Family Medicine

## 2020-01-16 DIAGNOSIS — D5700 Hb-SS disease with crisis, unspecified: Secondary | ICD-10-CM | POA: Diagnosis present

## 2020-01-16 DIAGNOSIS — F419 Anxiety disorder, unspecified: Secondary | ICD-10-CM | POA: Insufficient documentation

## 2020-01-16 DIAGNOSIS — Z21 Asymptomatic human immunodeficiency virus [HIV] infection status: Secondary | ICD-10-CM | POA: Insufficient documentation

## 2020-01-16 DIAGNOSIS — G894 Chronic pain syndrome: Secondary | ICD-10-CM | POA: Diagnosis not present

## 2020-01-16 DIAGNOSIS — F1120 Opioid dependence, uncomplicated: Secondary | ICD-10-CM | POA: Insufficient documentation

## 2020-01-16 DIAGNOSIS — Z888 Allergy status to other drugs, medicaments and biological substances status: Secondary | ICD-10-CM | POA: Insufficient documentation

## 2020-01-16 DIAGNOSIS — D638 Anemia in other chronic diseases classified elsewhere: Secondary | ICD-10-CM | POA: Diagnosis not present

## 2020-01-16 LAB — CBC WITH DIFFERENTIAL/PLATELET
Abs Immature Granulocytes: 0.12 10*3/uL — ABNORMAL HIGH (ref 0.00–0.07)
Basophils Absolute: 0.1 10*3/uL (ref 0.0–0.1)
Basophils Relative: 0 %
Eosinophils Absolute: 0.2 10*3/uL (ref 0.0–0.5)
Eosinophils Relative: 2 %
HCT: 20.6 % — ABNORMAL LOW (ref 39.0–52.0)
Hemoglobin: 7.9 g/dL — ABNORMAL LOW (ref 13.0–17.0)
Immature Granulocytes: 1 %
Lymphocytes Relative: 33 %
Lymphs Abs: 5.2 10*3/uL — ABNORMAL HIGH (ref 0.7–4.0)
MCH: 39.7 pg — ABNORMAL HIGH (ref 26.0–34.0)
MCHC: 38.3 g/dL — ABNORMAL HIGH (ref 30.0–36.0)
MCV: 103.5 fL — ABNORMAL HIGH (ref 80.0–100.0)
Monocytes Absolute: 1.9 10*3/uL — ABNORMAL HIGH (ref 0.1–1.0)
Monocytes Relative: 12 %
Neutro Abs: 8.2 10*3/uL — ABNORMAL HIGH (ref 1.7–7.7)
Neutrophils Relative %: 52 %
Platelets: 455 10*3/uL — ABNORMAL HIGH (ref 150–400)
RBC: 1.99 MIL/uL — ABNORMAL LOW (ref 4.22–5.81)
RDW: 25.2 % — ABNORMAL HIGH (ref 11.5–15.5)
WBC: 15.7 10*3/uL — ABNORMAL HIGH (ref 4.0–10.5)
nRBC: 6.3 % — ABNORMAL HIGH (ref 0.0–0.2)

## 2020-01-16 LAB — RETICULOCYTES
Immature Retic Fract: 29 % — ABNORMAL HIGH (ref 2.3–15.9)
RBC.: 2.01 MIL/uL — ABNORMAL LOW (ref 4.22–5.81)
Retic Count, Absolute: 502 10*3/uL — ABNORMAL HIGH (ref 19.0–186.0)
Retic Ct Pct: 23.3 % — ABNORMAL HIGH (ref 0.4–3.1)

## 2020-01-16 LAB — LACTATE DEHYDROGENASE: LDH: 482 U/L — ABNORMAL HIGH (ref 98–192)

## 2020-01-16 MED ORDER — SODIUM CHLORIDE 0.9% FLUSH: 9.0000 mL | INTRAVENOUS | Status: DC | PRN

## 2020-01-16 MED ORDER — HEPARIN SOD (PORK) LOCK FLUSH 100 UNIT/ML IV SOLN
500.0000 [IU] | INTRAVENOUS | Status: AC | PRN
  Administered 2020-01-16: 500 [IU]
  Filled 2020-01-16: qty 5

## 2020-01-16 MED ORDER — SODIUM CHLORIDE 0.45 % IV SOLN: INTRAVENOUS | Status: DC

## 2020-01-16 MED ORDER — SODIUM CHLORIDE 0.9% FLUSH
10.0000 mL | INTRAVENOUS | Status: AC | PRN
  Administered 2020-01-16: 10 mL

## 2020-01-16 MED ORDER — ACETAMINOPHEN 500 MG PO TABS
1000.0000 mg | ORAL_TABLET | Freq: Once | ORAL | Status: AC
  Administered 2020-01-16: 1000 mg via ORAL
  Filled 2020-01-16: qty 2

## 2020-01-16 MED ORDER — NALOXONE HCL 0.4 MG/ML IJ SOLN: 0.4000 mg | INTRAMUSCULAR | Status: DC | PRN

## 2020-01-16 MED ORDER — ONDANSETRON HCL 4 MG/2ML IJ SOLN: 4.0000 mg | Freq: Four times a day (QID) | INTRAMUSCULAR | Status: DC | PRN

## 2020-01-16 MED ORDER — HYDROMORPHONE 1 MG/ML IV SOLN
INTRAVENOUS | Status: DC
  Administered 2020-01-16: 30 mg via INTRAVENOUS
  Administered 2020-01-16: 15 mg via INTRAVENOUS
  Filled 2020-01-16: qty 30

## 2020-01-16 MED ORDER — DIPHENHYDRAMINE HCL 25 MG PO CAPS: 25.0000 mg | ORAL_CAPSULE | ORAL | Status: DC | PRN

## 2020-01-16 NOTE — Discharge Instructions (Signed)
Sickle Cell Anemia, Adult  Sickle cell anemia is a condition where your red blood cells are shaped like sickles. Red blood cells carry oxygen through the body. Sickle-shaped cells do not live as long as normal red blood cells. They also clump together and block blood from flowing through the blood vessels. This prevents the body from getting enough oxygen. Sickle cell anemia causes organ damage and pain. It also increases the risk of infection. Follow these instructions at home: Medicines  Take over-the-counter and prescription medicines only as told by your doctor.  If you were prescribed an antibiotic medicine, take it as told by your doctor. Do not stop taking the antibiotic even if you start to feel better.  If you develop a fever, do not take medicines to lower the fever right away. Tell your doctor about the fever. Managing pain, stiffness, and swelling  Try these methods to help with pain: ? Use a heating pad. ? Take a warm bath. ? Distract yourself, such as by watching TV. Eating and drinking  Drink enough fluid to keep your pee (urine) clear or pale yellow. Drink more in hot weather and during exercise.  Limit or avoid alcohol.  Eat a healthy diet. Eat plenty of fruits, vegetables, whole grains, and lean protein.  Take vitamins and supplements as told by your doctor. Traveling  When traveling, keep these with you: ? Your medical information. ? The names of your doctors. ? Your medicines.  If you need to take an airplane, talk to your doctor first. Activity  Rest often.  Avoid exercises that make your heart beat much faster, such as jogging. General instructions  Do not use products that have nicotine or tobacco, such as cigarettes and e-cigarettes. If you need help quitting, ask your doctor.  Consider wearing a medical alert bracelet.  Avoid being in high places (high altitudes), such as mountains.  Avoid very hot or cold temperatures.  Avoid places where the  temperature changes a lot.  Keep all follow-up visits as told by your doctor. This is important. Contact a doctor if:  A joint hurts.  Your feet or hands hurt or swell.  You feel tired (fatigued). Get help right away if:  You have symptoms of infection. These include: ? Fever. ? Chills. ? Being very tired. ? Irritability. ? Poor eating. ? Throwing up (vomiting).  You feel dizzy or faint.  You have new stomach pain, especially on the left side.  You have a an erection (priapism) that lasts more than 4 hours.  You have numbness in your arms or legs.  You have a hard time moving your arms or legs.  You have trouble talking.  You have pain that does not go away when you take medicine.  You are short of breath.  You are breathing fast.  You have a long-term cough.  You have pain in your chest.  You have a bad headache.  You have a stiff neck.  Your stomach looks bloated even though you did not eat much.  Your skin is pale.  You suddenly cannot see well. Summary  Sickle cell anemia is a condition where your red blood cells are shaped like sickles.  Follow your doctor's advice on ways to manage pain, food to eat, activities to do, and steps to take for safe travel.  Get medical help right away if you have any signs of infection, such as a fever. This information is not intended to replace advice given to you by   your health care provider. Make sure you discuss any questions you have with your health care provider. Document Revised: 07/29/2018 Document Reviewed: 05/12/2016 Elsevier Patient Education  2020 Elsevier Inc.  

## 2020-01-16 NOTE — Progress Notes (Signed)
Patient called requesting to come to the day hospital for sickle cell pain. Patient reports generalized pain rated 10/10. Report taking Percocet at 5:00 am. COVID-19 screening done and patient denies all symptoms and exposures. Denies fever, chest pain, nausea, vomiting, diarrhea, abdominal pain and priapism. Admits to having transportation at discharge without driving self. [place], [name] notified. Patient can come to the day hospital for pain management. Patient advised and expresses an understanding.   

## 2020-01-16 NOTE — H&P (Signed)
[facility] History and Physical   Date: 01/17/2020  Patient name: Martin [name] Medical record number: [id number] Date of birth: 07-14-1990 Age: 29 y.o. Gender: male PCP: [name], [name]  Attending physician: No att. providers found  Chief Complaint: Sickle cell pain  History of Present Illness: Martin [name] is a 29 year old male with a medical history for sickle cell disease type SS, chronic pain syndrome, opiate dependence and tolerance, HIV disease, generalized anxiety, and history of anemia of chronic disease presents complaining of low back and lower extremity pain that is consistent with his previous pain crisis.  Patient states that pain intensity has been elevated over the past several days.  He attributes current pain crisis to changes in weather.  He last had Percocet this a.m. without sustained relief.  He rates pain at 9/10 characterized as constant and aching.  Denied any fever, chills, shortness of breath, chest pain, urinary symptoms.  No nausea, vomiting, or diarrhea.  Patient has had no sick contacts, recent travel, or exposure to COVID-19.   Meds: No medications prior to admission.    Allergies: Tape and Ketorolac Past Medical History:  Diagnosis Date  . Anxiety   . HIV (human immunodeficiency virus infection) (HCC)   . Proteinuria   . Sickle cell crisis (HCC)   . 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HCC)   . Vitamin D deficiency 10/2018   Past Surgical History:  Procedure Laterality Date  . IR IMAGING GUIDED PORT INSERTION  08/29/2019   Family History  Problem Relation Age of Onset  . Sickle cell trait Mother   . Sickle cell trait Father   . Birth defects Maternal Grandmother   . Birth defects Paternal Grandmother    Social History   Socioeconomic History  . Marital status: Single    Spouse name: Not on file  . Number of children: Not on file  . Years of education: Not on file  . Highest education level: Not on file  Occupational  History  . Not on file  Tobacco Use  . Smoking status: Never Smoker  . Smokeless tobacco: Never Used  Vaping Use  . Vaping Use: Some days  . Substances: THC  Substance and Sexual Activity  . Alcohol use: No  . Drug use: Yes    Types: Marijuana  . Sexual activity: Yes    Birth control/protection: Condom  Other Topics Concern  . Not on file  Social History Narrative  . Not on file   Social Determinants of Health   Financial Resource Strain:   . Difficulty of Paying Living Expenses: Not on file  Food Insecurity:   . Worried About Programme researcher, broadcasting/film/video in the Last Year: Not on file  . Ran Out of Food in the Last Year: Not on file  Transportation Needs:   . Lack of Transportation (Medical): Not on file  . Lack of Transportation (Non-Medical): Not on file  Physical Activity:   . Days of Exercise per Week: Not on file  . Minutes of Exercise per Session: Not on file  Stress:   . Feeling of Stress : Not on file  Social Connections:   . Frequency of Communication with Friends and Family: Not on file  . Frequency of Social Gatherings with Friends and Family: Not on file  . Attends Religious Services: Not on file  . Active Member of Clubs or Organizations: Not on file  . Attends Banker Meetings: Not on file  . Marital Status: Not  on file  Intimate Partner Violence:   . Fear of Current or Ex-Partner: Not on file  . Emotionally Abused: Not on file  . Physically Abused: Not on file  . Sexually Abused: Not on file   Review of Systems  Constitutional: Negative.   HENT: Negative.   Eyes: Negative.   Respiratory: Negative.   Cardiovascular: Negative.  Negative for chest pain.  Gastrointestinal: Negative.   Genitourinary: Negative.   Musculoskeletal: Positive for back pain and joint pain.  Skin: Negative.   Neurological: Negative.   Psychiatric/Behavioral: Negative.     Physical Exam: Blood pressure 102/64, pulse 71, temperature 98.4 F (36.9 C), temperature  source Temporal, resp. rate 10, SpO2 93 %. BP 102/64 (BP Location: Right Arm)   Pulse 71   Temp 98.4 F (36.9 C) (Temporal)   Resp 10   SpO2 93%   General Appearance:    Alert, cooperative, no distress, appears stated age  Head:    Normocephalic, without obvious abnormality, atraumatic  Eyes:    PERRL, conjunctiva/corneas clear, EOM's intact, fundi    benign, both eyes       Throat:   Lips, mucosa, and tongue normal; teeth and gums normal  Neck:   Supple, symmetrical, trachea midline, no adenopathy;       thyroid:  No enlargement/tenderness/nodules; no carotid   bruit or JVD  Back:     Symmetric, no curvature, ROM normal, no CVA tenderness  Lungs:     Clear to auscultation bilaterally, respirations unlabored  Chest wall:    No tenderness or deformity  Heart:    Regular rate and rhythm, S1 and S2 normal, no murmur, rub   or gallop  Abdomen:     Soft, non-tender, bowel sounds active all four quadrants,    no masses, no organomegaly  Extremities:   Extremities normal, atraumatic, no cyanosis or edema  Pulses:   2+ and symmetric all extremities  Skin:   Skin color, texture, turgor normal, no rashes or lesions  Neurologic:   CNII-XII intact. Normal strength, sensation and reflexes      throughout    Lab results: No results found for this or any previous visit (from the past 24 hour(s)).  Imaging results:  No results found.   Assessment & Plan: Patient admitted to sickle cell day infusion center for management of pain crisis.  Patient is opiate tolerant Initiate IV dilaudid PCA. Settings of 0.5 mg, 10 minute lockout, and 3 mg/hr IV fluids, 0.45% saline at 100 ml/hr Tylenol 1000 mg by mouth times one dose Review CBC with differential, complete metabolic panel, and reticulocytes as results become available. Baseline hemoglobin is 8-9 Pain intensity will be reevaluated in context of functioning and relationship to baseline as care progress If pain intensity remains elevated and/or  sudden change in hemodynamic stability transition to inpatient services for higher level of care.     [name]  APRN, [name], [name] Patient Care [facility] [address] [place], [place] [address] [phone]  01/17/2020, 12:33 PM

## 2020-01-16 NOTE — Progress Notes (Signed)
Patient admitted to the day infusion hospital for sickle cell pain crisis. Initially, patient reported generalized pain rated 10/10. For pain management, patient placed on Dilaudid PCA, given 15 mg Toradol and hydrated with IV fluids. At discharge, patient rated pain at 4/10. Vital signs stable. Discharge instructions given. Patient alert, oriented and ambulatory at discharge.

## 2020-01-16 NOTE — Telephone Encounter (Signed)
Patient called requesting to come to the day hospital for sickle cell pain. Patient reports generalized pain rated 10/10. Report taking Percocet at 5:00 am. COVID-19 screening done and patient denies all symptoms and exposures. Denies fever, chest pain, nausea, vomiting, diarrhea, abdominal pain and priapism. Admits to having transportation at discharge without driving self. [place], [name] notified. Patient can come to the day hospital for pain management. Patient advised and expresses an understanding.

## 2020-01-17 ENCOUNTER — Other Ambulatory Visit: Payer: Self-pay | Admitting: Family Medicine

## 2020-01-17 ENCOUNTER — Telehealth: Payer: Self-pay | Admitting: Family Medicine

## 2020-01-17 DIAGNOSIS — D571 Sickle-cell disease without crisis: Secondary | ICD-10-CM

## 2020-01-17 DIAGNOSIS — G8929 Other chronic pain: Secondary | ICD-10-CM

## 2020-01-17 DIAGNOSIS — F419 Anxiety disorder, unspecified: Secondary | ICD-10-CM

## 2020-01-17 MED ORDER — OXYCODONE-ACETAMINOPHEN 10-325 MG PO TABS: 1.0000 | ORAL_TABLET | ORAL | 0 refills | Status: DC | PRN

## 2020-01-17 MED ORDER — ALPRAZOLAM 1 MG PO TABS: ORAL_TABLET | ORAL | 0 refills | Status: DC

## 2020-01-17 NOTE — Discharge Summary (Signed)
[facility] Discharge Summary   Patient ID: Martin [name] MRN: [id number] DOB/AGE: [birth year] 29 y.o.  Admit date: 01/16/2020 Discharge date: 01/17/2020  Primary Care Physician:  [name], [name]  Admission Diagnoses:  Active Problems:   Sickle cell pain crisis [facility])  Discharge Medications:  Allergies as of 01/16/2020      Reactions   Tape Rash, Other (See Comments)   PLEASE DO NOT USE THE CLEAR, THICK, "PLASTIC" TAPE- only paper tape is tolerated    Ketorolac Swelling, Other (See Comments)   Patient reports facial edema and left arm edema after administration.      Medication List    TAKE these medications   ALPRAZolam 1 MG tablet Commonly known as: XANAX Take 1 tablet by mouth 3 times daily as needed for anxiety or sleep.   apixaban 5 MG Tabs tablet Commonly known as: Eliquis Take 1 tablet (5 mg total) by mouth 2 (two) times daily.   cetirizine 10 MG tablet Commonly known as: ZYRTEC Take 1 tablet (10 mg total) by mouth daily. What changed:   when to take this  reasons to take this   guaiFENesin-dextromethorphan 100-10 MG/5ML syrup Commonly known as: ROBITUSSIN DM Take 5 mLs by mouth every 4 (four) hours as needed for cough.   hydroxyurea 500 MG capsule Commonly known as: HYDREA Take 4 capsules (2,000 mg total) by mouth at bedtime.   multivitamin with minerals Tabs tablet Take 1 tablet by mouth daily with breakfast.   naloxone 0.4 MG/ML injection Commonly known as: NARCAN As needed.   Triumeq 600-50-300 MG tablet Generic drug: abacavir-dolutegravir-lamiVUDine Take 1 tablet by mouth at bedtime.   Vitamin D (Ergocalciferol) 1.25 MG (50000 UNIT) Caps capsule Commonly known as: DRISDOL Take 1 capsule (50,000 Units total) by mouth every 7 (seven) days. What changed: additional instructions        Consults:  None  Significant Diagnostic Studies:  DG Chest 2 View  Result Date: 01/02/2020 CLINICAL DATA:  Hypoxia.  Sickle  cell. EXAM: CHEST - 2 VIEW COMPARISON:  11/21/2019 FINDINGS: Chronic pulmonary reticulation/scarring. Stable, normal heart size. Right-sided port with tip in good position. No visible effusion or pneumothorax. IMPRESSION: Pulmonary scarring.  No acute finding. Electronically Signed   By: Marnee Spring M.D.   On: 01/02/2020 07:11   History of present illness:  Martin [name] is a 29 year old male with a medical history for sickle cell disease type SS, chronic pain syndrome, opiate dependence and tolerance, HIV disease, generalized anxiety, and history of anemia of chronic disease presents complaining of low back and lower extremity pain that is consistent with his previous pain crisis.  Patient states that pain intensity has been elevated over the past several days.  He attributes current pain crisis to changes in weather.  He last had Percocet this a.m. without sustained relief.  He rates pain at 9/10 characterized as constant and aching.  Denied any fever, chills, shortness of breath, chest pain, urinary symptoms.  No nausea, vomiting, or diarrhea.  Patient has had no sick contacts, recent travel, or exposure to COVID-19.  [facility] Course:  Patient admitted to sickle cell day infusion clinic for management of pain crisis. All laboratory values reviewed.  WBCs 15.7.  Patient is afebrile without any signs of infection or inflammation.  No antibiotics warranted at this time. Hemoglobin 7.9, consistent with patient's baseline.  There is no clinical indication for blood transfusion today. Pain managed with IV Dilaudid PCA with settings of 0.5  mg, 10-minute lockout, and 3 mg/h Tylenol 1000 mg x 1 IV fluids, 0.45% saline at 100 mL/h Pain intensity decreased to 5/10.  Patient does not warrant inpatient admission on today.  Advised to follow-up with PCP.  He is inquiring about home health forms, advised to make appointment with PCP for this problem. He is alert, oriented, and ambulating  without assistance. Patient will discharge home in a hemodynamically stable condition.  Discharge instructions:  Resume all home medications.   Follow up with PCP as previously  scheduled.   Discussed the importance of drinking 64 ounces of water daily, dehydration of red blood cells may lead further sickling.   Avoid all stressors that precipitate sickle cell pain crisis.     The patient was given clear instructions to go to ER or return to medical center if symptoms do not improve, worsen or new problems develop.    Physical Exam at Discharge:  BP 102/64 (BP Location: Right Arm)   Pulse 71   Temp 98.4 F (36.9 C) (Temporal)   Resp 10   SpO2 93%   Physical Exam Constitutional:      Appearance: Normal appearance.  HENT:     Mouth/Throat:     Mouth: Mucous membranes are moist.  Eyes:     Pupils: Pupils are equal, round, and reactive to light.  Cardiovascular:     Rate and Rhythm: Normal rate and regular rhythm.     Pulses: Normal pulses.  Pulmonary:     Effort: Pulmonary effort is normal.  Abdominal:     General: Abdomen is flat. Bowel sounds are normal.  Musculoskeletal:        General: Normal range of motion.  Skin:    General: Skin is warm.  Neurological:     General: No focal deficit present.     Mental Status: He is alert. Mental status is at baseline.  Psychiatric:        Mood and Affect: Mood normal.        Behavior: Behavior normal.        Thought Content: Thought content normal.        Judgment: Judgment normal.      Disposition at Discharge: Discharge disposition: 01-Home or Self Care       Discharge Orders: Discharge Instructions    Discharge patient   Complete by: As directed    Discharge disposition: 01-Home or Self Care   Discharge patient date: 01/16/2020      Condition at Discharge:   Stable  Time spent on Discharge:  Greater than 30 minutes.  Signed: [name]  APRN, [name], [name] Patient Care [facility] [address] [place], [place] [address] [phone]  01/17/2020, 12:27 PM

## 2020-01-18 NOTE — Telephone Encounter (Signed)
Done

## 2020-01-18 NOTE — Telephone Encounter (Signed)
DOne

## 2020-01-18 NOTE — Telephone Encounter (Signed)
Pt was called to ask which medication he out of.Pt phone stated he can received any calls.. I did send pt  PA for his oxycodone.Martin [name]

## 2020-01-29 ENCOUNTER — Emergency Department (HOSPITAL_COMMUNITY): Payer: Medicaid Other

## 2020-01-29 ENCOUNTER — Encounter (HOSPITAL_COMMUNITY): Payer: Self-pay | Admitting: Emergency Medicine

## 2020-01-29 ENCOUNTER — Other Ambulatory Visit: Payer: Self-pay

## 2020-01-29 ENCOUNTER — Encounter (HOSPITAL_COMMUNITY): Payer: Self-pay | Admitting: Family Medicine

## 2020-01-29 ENCOUNTER — Emergency Department (HOSPITAL_COMMUNITY)
Admission: EM | Admit: 2020-01-29 | Discharge: 2020-01-29 | Disposition: A | Discharge status: Home / Self Care | Payer: Medicaid Other | Attending: Emergency Medicine | Admitting: Emergency Medicine

## 2020-01-29 ENCOUNTER — Non-Acute Institutional Stay (EMERGENCY_DEPARTMENT_HOSPITAL)
Admission: AD | Admit: 2020-01-29 | Discharge: 2020-01-29 | Disposition: A | Discharge status: Home / Self Care | Payer: Medicaid Other | Source: Ambulatory Visit | Attending: Internal Medicine | Admitting: Internal Medicine

## 2020-01-29 ENCOUNTER — Ambulatory Visit: Payer: Medicaid Other | Admitting: Family Medicine

## 2020-01-29 DIAGNOSIS — D5700 Hb-SS disease with crisis, unspecified: Secondary | ICD-10-CM | POA: Diagnosis present

## 2020-01-29 DIAGNOSIS — Z7901 Long term (current) use of anticoagulants: Secondary | ICD-10-CM | POA: Insufficient documentation

## 2020-01-29 LAB — COMPREHENSIVE METABOLIC PANEL
ALT: 17 U/L (ref 0–44)
AST: 36 U/L (ref 15–41)
Albumin: 3.6 g/dL (ref 3.5–5.0)
Alkaline Phosphatase: 52 U/L (ref 38–126)
Anion gap: 8 (ref 5–15)
BUN: 10 mg/dL (ref 6–20)
CO2: 23 mmol/L (ref 22–32)
Calcium: 8.4 mg/dL — ABNORMAL LOW (ref 8.9–10.3)
Chloride: 106 mmol/L (ref 98–111)
Creatinine, Ser: 0.56 mg/dL — ABNORMAL LOW (ref 0.61–1.24)
GFR, Estimated: >60 mL/min (ref >60)
Glucose, Bld: 95 mg/dL (ref 70–99)
Potassium: 3.7 mmol/L (ref 3.5–5.1)
Sodium: 137 mmol/L (ref 135–145)
Total Bilirubin: 6.4 mg/dL — ABNORMAL HIGH (ref 0.3–1.2)
Total Protein: 8.5 g/dL — ABNORMAL HIGH (ref 6.5–8.1)

## 2020-01-29 LAB — CBC WITH DIFFERENTIAL/PLATELET
Abs Immature Granulocytes: 0.1 10*3/uL — ABNORMAL HIGH (ref 0.00–0.07)
Basophils Absolute: 0.1 10*3/uL (ref 0.0–0.1)
Basophils Relative: 0 %
Eosinophils Absolute: 0.2 10*3/uL (ref 0.0–0.5)
Eosinophils Relative: 1 %
HCT: 22.5 % — ABNORMAL LOW (ref 39.0–52.0)
Hemoglobin: 8.4 g/dL — ABNORMAL LOW (ref 13.0–17.0)
Immature Granulocytes: 1 %
Lymphocytes Relative: 29 %
Lymphs Abs: 5.6 10*3/uL — ABNORMAL HIGH (ref 0.7–4.0)
MCH: 37.7 pg — ABNORMAL HIGH (ref 26.0–34.0)
MCHC: 37.3 g/dL — ABNORMAL HIGH (ref 30.0–36.0)
MCV: 100.9 fL — ABNORMAL HIGH (ref 80.0–100.0)
Monocytes Absolute: 1.5 10*3/uL — ABNORMAL HIGH (ref 0.1–1.0)
Monocytes Relative: 8 %
Neutro Abs: 12 10*3/uL — ABNORMAL HIGH (ref 1.7–7.7)
Neutrophils Relative %: 61 %
Platelets: 361 10*3/uL (ref 150–400)
RBC: 2.23 MIL/uL — ABNORMAL LOW (ref 4.22–5.81)
RDW: 23.1 % — ABNORMAL HIGH (ref 11.5–15.5)
WBC: 19.4 10*3/uL — ABNORMAL HIGH (ref 4.0–10.5)
nRBC: 8.6 % — ABNORMAL HIGH (ref 0.0–0.2)

## 2020-01-29 LAB — RETICULOCYTES
Immature Retic Fract: 41.6 % — ABNORMAL HIGH (ref 2.3–15.9)
RBC.: 2.15 MIL/uL — ABNORMAL LOW (ref 4.22–5.81)
Retic Count, Absolute: 407 10*3/uL — ABNORMAL HIGH (ref 19.0–186.0)
Retic Ct Pct: 18.9 % — ABNORMAL HIGH (ref 0.4–3.1)

## 2020-01-29 MED ORDER — SODIUM CHLORIDE 0.45 % IV SOLN: INTRAVENOUS | Status: DC

## 2020-01-29 MED ORDER — HEPARIN SOD (PORK) LOCK FLUSH 100 UNIT/ML IV SOLN: 500.0000 [IU] | INTRAVENOUS | Status: DC | PRN

## 2020-01-29 MED ORDER — SODIUM CHLORIDE 0.9% FLUSH: 9.0000 mL | INTRAVENOUS | Status: DC | PRN

## 2020-01-29 MED ORDER — ONDANSETRON HCL 4 MG/2ML IJ SOLN: 4.0000 mg | Freq: Four times a day (QID) | INTRAMUSCULAR | Status: DC | PRN

## 2020-01-29 MED ORDER — HYDROMORPHONE 1 MG/ML IV SOLN
INTRAVENOUS | Status: DC
  Administered 2020-01-29: 30 mg via INTRAVENOUS
  Administered 2020-01-29: 13.5 mg via INTRAVENOUS
  Filled 2020-01-29: qty 30

## 2020-01-29 MED ORDER — DIPHENHYDRAMINE HCL 25 MG PO CAPS
25.0000 mg | ORAL_CAPSULE | ORAL | Status: DC | PRN
  Administered 2020-01-29: 25 mg via ORAL
  Filled 2020-01-29: qty 1

## 2020-01-29 MED ORDER — HYDROMORPHONE HCL 2 MG/ML IJ SOLN
2.0000 mg | INTRAMUSCULAR | Status: AC
  Administered 2020-01-29: 2 mg via INTRAVENOUS
  Filled 2020-01-29: qty 1

## 2020-01-29 MED ORDER — DIPHENHYDRAMINE HCL 50 MG/ML IJ SOLN
25.0000 mg | Freq: Once | INTRAMUSCULAR | Status: AC
  Administered 2020-01-29: 25 mg via INTRAVENOUS
  Filled 2020-01-29: qty 1

## 2020-01-29 MED ORDER — NALOXONE HCL 0.4 MG/ML IJ SOLN: 0.4000 mg | INTRAMUSCULAR | Status: DC | PRN

## 2020-01-29 MED ORDER — SODIUM CHLORIDE 0.9% FLUSH: 10.0000 mL | INTRAVENOUS | Status: DC | PRN

## 2020-01-29 MED ORDER — DEXTROSE-NACL 5-0.45 % IV SOLN: INTRAVENOUS | Status: DC

## 2020-01-29 NOTE — ED Provider Notes (Signed)
[facility]-EMERGENCY DEPT Provider Note   CSN: [id number] Arrival date & time: 01/29/20  [id number]     History Chief Complaint  Patient presents with  . Sickle Cell Pain Crisis    Martin [name] is a 29 y.o. male with pertinent past medical history of sickle cell disease, HIV, PE on Eliquis with multiple sickle cell crises that presents emergency department today for sickle cell crisis.  Patient states that he has been taking his Percocet as normal, states that this morning he woke up with severe pain.  States that pain is generalized, describes it as diffuse aching pain, worse in his back.  States that this is normal for his sickle cell distribution.  No chest pain, shortness of breath.  Patient is normally on oxygen, 2 L at night.  States that he normally does not have to wear oxygen during the day.  No fevers, chills, nausea, vomiting, abdominal pain, numbness, tingling, facial droop, confusion, headache, vision changes, neck pain.  States that he was in normal health yesterday.  Denies any sick contacts.  Denies any cough, sore throat, congestion.  Per chart review patient was last seen for sickle cell crisis January 02, 2020, had to be admitted for pain control.   HPI     Past Medical History:  Diagnosis Date  . Anxiety   . HIV (human immunodeficiency virus infection) (HCC)   . Proteinuria   . Sickle cell crisis (HCC)   . 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HCC)   . Vitamin D deficiency 10/2018    Patient Active Problem List   Diagnosis Date Noted  . Leukocytosis 01/02/2020  . Anxiety 11/27/2019  . Proteinuria 11/27/2019  . Scleral icterus   . Chronic, continuous use of opioids 08/15/2019  . Seasonal allergies 08/15/2019  . Hypoxia   . Chest congestion   . Sickle cell anemia with crisis (HCC) 08/04/2019  . Other chronic pain 08/04/2019  . History of pulmonary embolus (PE) 08/04/2019  . Acute pulmonary embolism (HCC) 02/10/2019  . Acute chest syndrome due to  sickle cell crisis (HCC) 02/10/2019  . Sickle cell crisis (HCC) 01/17/2019  . Heart murmur 02/03/2017  . Sickle cell disease with crisis (HCC) 02/02/2017  . 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 Sickle cell pain crisis (HCC) 02/02/2017  . 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HCC) 12/20/2016  . Vitamin D deficiency 08/14/2016  . High risk medication use 09/27/2014  . Generalized anxiety disorder 05/19/2014  . GERD (gastroesophageal reflux disease) 05/19/2014  . HIV (human immunodeficiency virus infection) (HCC) 05/23/2012    Past Surgical History:  Procedure Laterality Date  . IR IMAGING GUIDED PORT INSERTION  08/29/2019       Family History  Problem Relation Age of Onset  . Sickle cell trait Mother   . Sickle cell trait Father   . Birth defects Maternal Grandmother   . Birth defects Paternal Grandmother     Social History   Tobacco Use  . Smoking status: Never Smoker  . Smokeless tobacco: Never Used  Vaping Use  . Vaping Use: Some days  . Substances: THC  Substance Use Topics  . Alcohol use: No  . Drug use: Yes    Types: Marijuana    Home Medications Prior to Admission medications   Medication Sig Start Date End Date Taking? Authorizing Provider  abacavir-dolutegravir-lamiVUDine (TRIUMEQ) 600-50-300 MG tablet Take 1 tablet by mouth at bedtime.    Yes [provider]  ALPRAZolam Prudy Feeler(XANAX) 1 MG tablet Take 1 tablet by mouth 3  times daily as needed for anxiety or sleep. Patient taking differently: Take 1 mg by mouth 3 (three) times daily as needed for anxiety or sleep.  01/17/20 02/16/20 Yes [name], [name]  apixaban (ELIQUIS) 5 MG TABS tablet Take 1 tablet (5 mg total) by mouth 2 (two) times daily. 08/21/19  Yes Massie Maroon, [name]  cetirizine (ZYRTEC) 10 MG tablet Take 1 tablet (10 mg total) by mouth daily. Patient taking differently: Take 10 mg by mouth daily as needed for allergies.  08/04/19  Yes [name], [name]  guaiFENesin-dextromethorphan (ROBITUSSIN DM)  100-10 MG/5ML syrup Take 5 mLs by mouth every 4 (four) hours as needed for cough. 11/23/19  Yes Massie Maroon, [name]  hydroxyurea (HYDREA) 500 MG capsule Take 4 capsules (2,000 mg total) by mouth at bedtime. 12/08/19  Yes [name], [name]  Multiple Vitamin (MULTIVITAMIN WITH MINERALS) TABS tablet Take 1 tablet by mouth daily with breakfast.    Yes [provider]  naloxone (NARCAN) 0.4 MG/ML injection As needed. Patient taking differently: Place 0.4 mg into the nose as needed (overdose).  11/28/18  Yes [name], [name]  oxyCODONE-acetaminophen (PERCOCET) 10-325 MG tablet Take 1 tablet by mouth every 4 (four) hours as needed for up to 15 days for pain. 01/17/20 02/01/20 Yes [name], [name]  Vitamin D, Ergocalciferol, (DRISDOL) 1.25 MG (50000 UT) CAPS capsule Take 1 capsule (50,000 Units total) by mouth every 7 (seven) days. Patient taking differently: Take 50,000 Units by mouth every 7 (seven) days. Every Monday 11/01/18  Yes [name], [name]    Allergies    Tape and Ketorolac  Review of Systems   Review of Systems  Constitutional: Negative for chills, diaphoresis, fatigue and fever.  HENT: Negative for congestion, sore throat and trouble swallowing.   Eyes: Negative for pain and visual disturbance.  Respiratory: Negative for cough, shortness of breath and wheezing.   Cardiovascular: Negative for chest pain, palpitations and leg swelling.  Gastrointestinal: Negative for abdominal distention, abdominal pain, diarrhea, nausea and vomiting.  Genitourinary: Negative for difficulty urinating.  Musculoskeletal: Positive for arthralgias and myalgias. Negative for back pain, neck pain and neck stiffness.  Skin: Negative for pallor.  Neurological: Negative for dizziness, speech difficulty, weakness and headaches.  Psychiatric/Behavioral: Negative for confusion.    Physical Exam Updated Vital Signs BP 112/77   Pulse 69   Temp 98.8 F (37.1 C) (Oral)   Resp 18    Ht 6\' 3"  (1.905 m)   Wt 68 kg   SpO2 92%   BMI 18.75 kg/m   Physical Exam Constitutional:      General: He is in acute distress.     Appearance: Normal appearance. He is not ill-appearing, toxic-appearing or diaphoretic.  HENT:     Head: Normocephalic and atraumatic.     Mouth/Throat:     Mouth: Mucous membranes are moist.     Pharynx: Oropharynx is clear.  Eyes:     General: No scleral icterus.    Extraocular Movements: Extraocular movements intact.     Pupils: Pupils are equal, round, and reactive to light.  Cardiovascular:     Rate and Rhythm: Normal rate and regular rhythm.     Pulses: Normal pulses.     Heart sounds: Normal heart sounds.  Pulmonary:     Effort: Pulmonary effort is normal. No respiratory distress.     Breath sounds: Normal breath sounds. No stridor. No wheezing, rhonchi or rales.  Comments: Patient satting at 96% on 2 L, when taken off he does desat to 89%, patient states that this is normal for him.  No shortness of breath, respiratory distress.  Patient speaking to me in full sentences, no accessory muscle use. Chest:     Chest wall: No tenderness.  Abdominal:     General: Abdomen is flat. There is no distension.     Palpations: Abdomen is soft.     Tenderness: There is no abdominal tenderness. There is no guarding or rebound.  Musculoskeletal:        General: No swelling or tenderness. Normal range of motion.     Cervical back: Normal range of motion and neck supple. No rigidity.     Right lower leg: No edema.     Left lower leg: No edema.     Comments: Patient with tenderness to bilateral lower and upper extremities, and back.  No point tenderness to midline cervical, thoracic, lumbar spine.  Skin:    General: Skin is warm and dry.     Capillary Refill: Capillary refill takes less than 2 seconds.     Coloration: Skin is not pale.  Neurological:     General: No focal deficit present.     Mental Status: He is alert and oriented to person, place,  and time. Mental status is at baseline.     Cranial Nerves: No cranial nerve deficit.     Sensory: No sensory deficit.     Motor: No weakness.     Coordination: Coordination normal.  Psychiatric:        Mood and Affect: Mood normal.        Behavior: Behavior normal.     ED Results / Procedures / Treatments   Labs (all labs ordered are listed, but only abnormal results are displayed) Labs Reviewed  COMPREHENSIVE METABOLIC PANEL - Abnormal; Notable for the following components:      Result Value   Creatinine, Ser 0.56 (*)    Calcium 8.4 (*)    Total Protein 8.5 (*)    Total Bilirubin 6.4 (*)    All other components within normal limits  CBC WITH DIFFERENTIAL/PLATELET - Abnormal; Notable for the following components:   WBC 19.4 (*)    RBC 2.23 (*)    Hemoglobin 8.4 (*)    HCT 22.5 (*)    MCV 100.9 (*)    MCH 37.7 (*)    MCHC 37.3 (*)    RDW 23.1 (*)    nRBC 8.6 (*)    Neutro Abs 12.0 (*)    Lymphs Abs 5.6 (*)    Monocytes Absolute 1.5 (*)    Abs Immature Granulocytes 0.10 (*)    All other components within normal limits  RETICULOCYTES - Abnormal; Notable for the following components:   Retic Ct Pct 18.9 (*)    RBC. 2.15 (*)    Retic Count, Absolute 407.0 (*)    Immature Retic Fract 41.6 (*)    All other components within normal limits    EKG None  Radiology DG Chest Port 1 View  Result Date: 01/29/2020 CLINICAL DATA:  Shortness of breath.  Sickle cell pain. EXAM: PORTABLE CHEST 1 VIEW COMPARISON:  01/02/2020 FINDINGS: 0703 hours. Cardiopericardial silhouette is at upper limits of normal for size. Interstitial markings are diffusely coarsened with chronic features. No focal airspace consolidation. No pulmonary edema or pleural effusion. Right Port-A-Cath again noted. The visualized bony structures of the thorax show no acute abnormality. IMPRESSION: No active  disease. Electronically Signed   By: Kennith Center M.D.   On: 01/29/2020 07:46    Procedures Procedures  (including critical care time)  Medications Ordered in ED Medications  dextrose 5 %-0.45 % sodium chloride infusion ( Intravenous New Bag/Given 01/29/20 0745)  HYDROmorphone (DILAUDID) injection 2 mg (2 mg Intravenous Given 01/29/20 0746)  HYDROmorphone (DILAUDID) injection 2 mg (2 mg Intravenous Given 01/29/20 0825)  diphenhydrAMINE (BENADRYL) injection 25 mg (25 mg Intravenous Given 01/29/20 0741)    ED Course  I have reviewed the triage vital signs and the nursing notes.  Pertinent labs & imaging results that were available during my care of the patient were reviewed by me and considered in my medical decision making (see chart for details).    MDM Rules/Calculators/A&P                          Indio X Cossey is a 29 y.o. male with pertinent past medical history of sickle cell disease, HIV, PE on Eliquis with multiple sickle cell crises that presents emergency department today for sickle cell crisis.  Appears as if this is patient's normal sickle cell crisis, no concerns for acute chest syndrome, stroke, or other complications of sickle cell disease besides acute crises.  Patient is normally on 2 L, when reviewing prior notes patient was also found to be on 2 L with supplemental oxygen needed at night.  Will obtain labs and treat and monitor patient.  I think patient would be able to go to sickle cell outpatient center depending on labs.  835 spoke to [place] [facility], sickle cell center, who will accept the patient.  Patient has gotten 4 mg of Dilaudid and is currently getting dextrose infusion, upon reassessment patient states that he is still in pain .  Blood work today reassuring, chest x-ray interpreted me without any acute cardiopulmonary disease, EKG interpreted me without any signs of ischemia or arrhythmia.  Discharge to sickle cell center. Pt agreeable.   Final Clinical Impression(s) / ED Diagnoses Final diagnoses:  Sickle cell pain crisis [facility])    Rx / DC Orders ED  Discharge Orders    None       Farrel Gordon, PA-C 01/29/20 3810    Gwyneth Sprout, MD 01/31/20 1324

## 2020-01-29 NOTE — Discharge Instructions (Signed)
Discharge to sickle cell clinic

## 2020-01-29 NOTE — ED Notes (Signed)
Pt ambulated to bathroom 

## 2020-01-29 NOTE — Discharge Instructions (Signed)
Sickle Cell Anemia, Adult  Sickle cell anemia is a condition where your red blood cells are shaped like sickles. Red blood cells carry oxygen through the body. Sickle-shaped cells do not live as long as normal red blood cells. They also clump together and block blood from flowing through the blood vessels. This prevents the body from getting enough oxygen. Sickle cell anemia causes organ damage and pain. It also increases the risk of infection. Follow these instructions at home: Medicines  Take over-the-counter and prescription medicines only as told by your doctor.  If you were prescribed an antibiotic medicine, take it as told by your doctor. Do not stop taking the antibiotic even if you start to feel better.  If you develop a fever, do not take medicines to lower the fever right away. Tell your doctor about the fever. Managing pain, stiffness, and swelling  Try these methods to help with pain: ? Use a heating pad. ? Take a warm bath. ? Distract yourself, such as by watching TV. Eating and drinking  Drink enough fluid to keep your pee (urine) clear or pale yellow. Drink more in hot weather and during exercise.  Limit or avoid alcohol.  Eat a healthy diet. Eat plenty of fruits, vegetables, whole grains, and lean protein.  Take vitamins and supplements as told by your doctor. Traveling  When traveling, keep these with you: ? Your medical information. ? The names of your doctors. ? Your medicines.  If you need to take an airplane, talk to your doctor first. Activity  Rest often.  Avoid exercises that make your heart beat much faster, such as jogging. General instructions  Do not use products that have nicotine or tobacco, such as cigarettes and e-cigarettes. If you need help quitting, ask your doctor.  Consider wearing a medical alert bracelet.  Avoid being in high places (high altitudes), such as mountains.  Avoid very hot or cold temperatures.  Avoid places where the  temperature changes a lot.  Keep all follow-up visits as told by your doctor. This is important. Contact a doctor if:  A joint hurts.  Your feet or hands hurt or swell.  You feel tired (fatigued). Get help right away if:  You have symptoms of infection. These include: ? Fever. ? Chills. ? Being very tired. ? Irritability. ? Poor eating. ? Throwing up (vomiting).  You feel dizzy or faint.  You have new stomach pain, especially on the left side.  You have a an erection (priapism) that lasts more than 4 hours.  You have numbness in your arms or legs.  You have a hard time moving your arms or legs.  You have trouble talking.  You have pain that does not go away when you take medicine.  You are short of breath.  You are breathing fast.  You have a long-term cough.  You have pain in your chest.  You have a bad headache.  You have a stiff neck.  Your stomach looks bloated even though you did not eat much.  Your skin is pale.  You suddenly cannot see well. Summary  Sickle cell anemia is a condition where your red blood cells are shaped like sickles.  Follow your doctor's advice on ways to manage pain, food to eat, activities to do, and steps to take for safe travel.  Get medical help right away if you have any signs of infection, such as a fever. This information is not intended to replace advice given to you by   your health care provider. Make sure you discuss any questions you have with your health care provider. Document Revised: 07/29/2018 Document Reviewed: 05/12/2016 Elsevier Patient Education  2020 Elsevier Inc.  

## 2020-01-29 NOTE — Progress Notes (Signed)
Patient admitted to the day hospital for treatment of sickle cell pain crisis. Patient reported generalized pain rated 10/10. Patient placed on Dilaudid PCA, given PO Benadryl and hydrated with IV fluids. At discharge, patient reported  pain at 5/10. Discharge instructions given to patient. Alert, oriented and ambulatory at discharge.

## 2020-01-29 NOTE — Discharge Summary (Signed)
[facility] Discharge Summary   Patient ID: Martin [name] MRN: [id number] DOB/AGE: 16-Nov-1990 29 y.o.  Admit date: 01/29/2020 Discharge date: 01/29/2020  Primary Care Physician:  [name], [name]  Admission Diagnoses:  Active Problems:   Sickle cell pain crisis [facility])   Discharge Medications:  Allergies as of 01/29/2020      Reactions   Tape Rash, Other (See Comments)   PLEASE DO NOT USE THE CLEAR, THICK, "PLASTIC" TAPE- only paper tape is tolerated    Ketorolac Swelling, Other (See Comments)   Patient reports facial edema and left arm edema after administration.      Medication List    TAKE these medications   ALPRAZolam 1 MG tablet Commonly known as: XANAX Take 1 tablet by mouth 3 times daily as needed for anxiety or sleep. What changed:   how much to take  how to take this  when to take this  reasons to take this  additional instructions   apixaban 5 MG Tabs tablet Commonly known as: Eliquis Take 1 tablet (5 mg total) by mouth 2 (two) times daily.   cetirizine 10 MG tablet Commonly known as: ZYRTEC Take 1 tablet (10 mg total) by mouth daily. What changed:   when to take this  reasons to take this   guaiFENesin-dextromethorphan 100-10 MG/5ML syrup Commonly known as: ROBITUSSIN DM Take 5 mLs by mouth every 4 (four) hours as needed for cough.   hydroxyurea 500 MG capsule Commonly known as: HYDREA Take 4 capsules (2,000 mg total) by mouth at bedtime.   multivitamin with minerals Tabs tablet Take 1 tablet by mouth daily with breakfast.   naloxone 0.4 MG/ML injection Commonly known as: NARCAN As needed. What changed:   how much to take  how to take this  when to take this  reasons to take this  additional instructions   oxyCODONE-acetaminophen 10-325 MG tablet Commonly known as: PERCOCET Take 1 tablet by mouth every 4 (four) hours as needed for up to 15 days for pain.   Triumeq 600-50-300 MG tablet Generic  drug: abacavir-dolutegravir-lamiVUDine Take 1 tablet by mouth at bedtime.   Vitamin D (Ergocalciferol) 1.25 MG (50000 UNIT) Caps capsule Commonly known as: DRISDOL Take 1 capsule (50,000 Units total) by mouth every 7 (seven) days. What changed: additional instructions        Consults:  None  Significant Diagnostic Studies:  DG Chest 2 View  Result Date: 01/02/2020 CLINICAL DATA:  Hypoxia.  Sickle cell. EXAM: CHEST - 2 VIEW COMPARISON:  11/21/2019 FINDINGS: Chronic pulmonary reticulation/scarring. Stable, normal heart size. Right-sided port with tip in good position. No visible effusion or pneumothorax. IMPRESSION: Pulmonary scarring.  No acute finding. Electronically Signed   By: Marnee Spring M.D.   On: 01/02/2020 07:11   DG Chest Port 1 View  Result Date: 01/29/2020 CLINICAL DATA:  Shortness of breath.  Sickle cell pain. EXAM: PORTABLE CHEST 1 VIEW COMPARISON:  01/02/2020 FINDINGS: 0703 hours. Cardiopericardial silhouette is at upper limits of normal for size. Interstitial markings are diffusely coarsened with chronic features. No focal airspace consolidation. No pulmonary edema or pleural effusion. Right Port-A-Cath again noted. The visualized bony structures of the thorax show no acute abnormality. IMPRESSION: No active disease. Electronically Signed   By: Kennith Center M.D.   On: 01/29/2020 07:46   History of present illness:  Martin [name] is a 29 year old male with a medical history significant for sickle cell disease, chronic pain syndrome, opiate dependence and tolerance,  History of PE on Eliquis, HIV disease, generalized anxiety, and history of anemia of chronic disease presents with complaints of generalized pain. He says that pain is mostly to lower back and increased suddenly this am. Pain intensity is 8/10 characterized as constant and aching. Patient was treated and evaluated in the emergency department this am. Agreed with ER provider that patient is appropriate to  transition to sickle cell day infusion clinic for pain management and extended observation.  Prior to arrival patient received Dilaudid 4 mg IV without sustained relief.  Pain medication recommended.,  Headache, persistent cough, urinary symptoms, nausea, vomiting, or diarrhea.  He also denies sick contacts, recent travel, or exposure to COVID-19.  [facility] Course: Patient admitted to sickle cell day clinic for management of pain crisis. Reviewed all laboratory values, largely consistent with patient's baseline. Pain managed with IV Dilaudid PCA with settings of 0.5 mg, 10-minute lockout, and 3 mg/h IV fluids, 0.45% saline at 100 mL/h. Pain intensity decreased to 4/10.  He does not warrant inpatient admission at this time. He is alert, oriented, and ambulating without assistance.  Patient will discharge home in a hemodynamically stable condition.  Discharge instructions: Resume all home medications.   Follow up with PCP as previously  scheduled.   Discussed the importance of drinking 64 ounces of water daily, dehydration of red blood cells may lead further sickling.   Avoid all stressors that precipitate sickle cell pain crisis.     The patient was given clear instructions to go to ER or return to medical center if symptoms do not improve, worsen or new problems develop.     Physical Exam at Discharge:  BP 116/68 (BP Location: Right Arm)   Pulse 71   Temp 98.2 F (36.8 C) (Oral)   Resp 12   SpO2 91%   Physical Exam Constitutional:      Appearance: Normal appearance.  Eyes:     Pupils: Pupils are equal, round, and reactive to light.  Cardiovascular:     Rate and Rhythm: Normal rate and regular rhythm.     Pulses: Normal pulses.  Pulmonary:     Effort: Pulmonary effort is normal.  Abdominal:     General: Abdomen is flat. Bowel sounds are normal.  Musculoskeletal:        General: Normal range of motion.  Skin:    General: Skin is warm.  Neurological:      General: No focal deficit present.     Mental Status: He is alert. Mental status is at baseline.  Psychiatric:        Mood and Affect: Mood normal.        Thought Content: Thought content normal.        Judgment: Judgment normal.     Disposition at Discharge: Discharge disposition: 01-Home or Self Care       Discharge Orders: Discharge Instructions    Discharge patient   Complete by: As directed    Discharge disposition: 01-Home or Self Care   Discharge patient date: 01/29/2020      Condition at Discharge:   Stable  Time spent on Discharge:  Greater than 30 minutes.  Signed:  [name]  APRN, [name], [name] Patient Care [facility] [address] [place], [place] [address] [phone]  01/29/2020, 4:20 PM

## 2020-01-29 NOTE — H&P (Signed)
[facility] History and Physical   Date: 01/29/2020  Patient name: Martin [name] Medical record number: [id number] Date of birth: [birth year] Age: 29 y.o. Gender: male PCP: [name], [name]  Attending physician: Quentin Angst, MD  Chief Complaint: Sickle cell pain  History of Present Illness: Martin [name] is a 29 year old male with a medical history significant for sickle cell disease, chronic pain syndrome, opiate dependence and tolerance, History of PE on Eliquis, HIV disease, generalized anxiety, and history of anemia of chronic disease presents with complaints of generalized pain. He says that pain is mostly to lower back and increased suddenly this am. Pain intensity is 8/10 characterized as constant and aching. Patient was treated and evaluated in the emergency department this am. Agreed with ER provider that patient is appropriate to transition to sickle cell day infusion clinic for pain management and extended observation.  Prior to arrival patient received Dilaudid 4 mg IV without sustained relief.  Pain medication recommended.,  Headache, persistent cough, urinary symptoms, nausea, vomiting, or diarrhea.  He also denies sick contacts, recent travel, or exposure to COVID-19.  Meds: Medications Prior to Admission  Medication Sig Dispense Refill Last Dose  . abacavir-dolutegravir-lamiVUDine (TRIUMEQ) 600-50-300 MG tablet Take 1 tablet by mouth at bedtime.      . ALPRAZolam (XANAX) 1 MG tablet Take 1 tablet by mouth 3 times daily as needed for anxiety or sleep. (Patient taking differently: Take 1 mg by mouth 3 (three) times daily as needed for anxiety or sleep. ) 30 tablet 0   . apixaban (ELIQUIS) 5 MG TABS tablet Take 1 tablet (5 mg total) by mouth 2 (two) times daily. 60 tablet 5   . cetirizine (ZYRTEC) 10 MG tablet Take 1 tablet (10 mg total) by mouth daily. (Patient taking differently: Take 10 mg by mouth daily as needed for allergies. ) 30 tablet  11   . guaiFENesin-dextromethorphan (ROBITUSSIN DM) 100-10 MG/5ML syrup Take 5 mLs by mouth every 4 (four) hours as needed for cough. 118 mL 0   . hydroxyurea (HYDREA) 500 MG capsule Take 4 capsules (2,000 mg total) by mouth at bedtime. 120 capsule 11   . Multiple Vitamin (MULTIVITAMIN WITH MINERALS) TABS tablet Take 1 tablet by mouth daily with breakfast.      . naloxone (NARCAN) 0.4 MG/ML injection As needed. (Patient taking differently: Place 0.4 mg into the nose as needed (overdose). ) 1 mL 2   . oxyCODONE-acetaminophen (PERCOCET) 10-325 MG tablet Take 1 tablet by mouth every 4 (four) hours as needed for up to 15 days for pain. 90 tablet 0   . Vitamin D, Ergocalciferol, (DRISDOL) 1.25 MG (50000 UT) CAPS capsule Take 1 capsule (50,000 Units total) by mouth every 7 (seven) days. (Patient taking differently: Take 50,000 Units by mouth every 7 (seven) days. Every Monday) 5 capsule 3     Allergies: Tape and Ketorolac Past Medical History:  Diagnosis Date  . Anxiety   . HIV (human immunodeficiency virus infection) (HCC)   . Proteinuria   . Sickle cell crisis (HCC)   . 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HCC)   . Vitamin D deficiency 10/2018   Past Surgical History:  Procedure Laterality Date  . IR IMAGING GUIDED PORT INSERTION  08/29/2019   Family History  Problem Relation Age of Onset  . Sickle cell trait Mother   . Sickle cell trait Father   . Birth defects Maternal Grandmother   . Birth defects Paternal Grandmother  Social History   Socioeconomic History  . Marital status: Single    Spouse name: Not on file  . Number of children: Not on file  . Years of education: Not on file  . Highest education level: Not on file  Occupational History  . Not on file  Tobacco Use  . Smoking status: Never Smoker  . Smokeless tobacco: Never Used  Vaping Use  . Vaping Use: Some days  . Substances: THC  Substance and Sexual Activity  . Alcohol use: No  . Drug use: Yes    Types: Marijuana  . Sexual  activity: Yes    Birth control/protection: Condom  Other Topics Concern  . Not on file  Social History Narrative  . Not on file   Social Determinants of Health   Financial Resource Strain:   . Difficulty of Paying Living Expenses: Not on file  Food Insecurity:   . Worried About Programme researcher, broadcasting/film/video in the Last Year: Not on file  . Ran Out of Food in the Last Year: Not on file  Transportation Needs:   . Lack of Transportation (Medical): Not on file  . Lack of Transportation (Non-Medical): Not on file  Physical Activity:   . Days of Exercise per Week: Not on file  . Minutes of Exercise per Session: Not on file  Stress:   . Feeling of Stress : Not on file  Social Connections:   . Frequency of Communication with Friends and Family: Not on file  . Frequency of Social Gatherings with Friends and Family: Not on file  . Attends Religious Services: Not on file  . Active Member of Clubs or Organizations: Not on file  . Attends Banker Meetings: Not on file  . Marital Status: Not on file  Intimate Partner Violence:   . Fear of Current or Ex-Partner: Not on file  . Emotionally Abused: Not on file  . Physically Abused: Not on file  . Sexually Abused: Not on file   Review of Systems  Constitutional: Negative for chills and fever.  HENT: Negative.   Eyes: Negative.   Respiratory: Negative.   Cardiovascular: Negative.   Gastrointestinal: Negative.   Genitourinary: Negative.   Musculoskeletal: Positive for back pain and joint pain.  Skin: Negative.   Neurological: Negative.   Psychiatric/Behavioral: Negative.   ' Physical Exam: There were no vitals taken for this visit. Physical Exam Eyes:     General: Scleral icterus present.     Pupils: Pupils are equal, round, and reactive to light.  Cardiovascular:     Rate and Rhythm: Normal rate and regular rhythm.     Pulses: Normal pulses.     Heart sounds: Normal heart sounds.  Pulmonary:     Effort: Pulmonary effort is  normal.     Breath sounds: Normal breath sounds.  Abdominal:     General: Bowel sounds are normal.     Palpations: Abdomen is soft.  Neurological:     General: No focal deficit present.     Mental Status: He is alert and oriented to person, place, and time. Mental status is at baseline.  Psychiatric:        Mood and Affect: Mood normal.        Behavior: Behavior normal.        Thought Content: Thought content normal.        Judgment: Judgment normal.      Lab results: Results for orders placed or performed during the hospital encounter  of 01/29/20 (from the past 24 hour(s))  Comprehensive metabolic panel     Status: Abnormal   Collection Time: 01/29/20  7:46 AM  Result Value Ref Range   Sodium 137 135 - 145 mmol/L   Potassium 3.7 3.5 - 5.1 mmol/L   Chloride 106 98 - 111 mmol/L   CO2 23 22 - 32 mmol/L   Glucose, Bld 95 70 - 99 mg/dL   BUN 10 6 - 20 mg/dL   Creatinine, Ser 8.29 (L) 0.61 - 1.24 mg/dL   Calcium 8.4 (L) 8.9 - 10.3 mg/dL   Total Protein 8.5 (H) 6.5 - 8.1 g/dL   Albumin 3.6 3.5 - 5.0 g/dL   AST 36 15 - 41 U/L   ALT 17 0 - 44 U/L   Alkaline Phosphatase 52 38 - 126 U/L   Total Bilirubin 6.4 (H) 0.3 - 1.2 mg/dL   GFR, Estimated >56 >21 mL/min   Anion gap 8 5 - 15  CBC with Differential     Status: Abnormal   Collection Time: 01/29/20  7:46 AM  Result Value Ref Range   WBC 19.4 (H) 4.0 - 10.5 K/uL   RBC 2.23 (L) 4.22 - 5.81 MIL/uL   Hemoglobin 8.4 (L) 13.0 - 17.0 g/dL   HCT 30.8 (L) 39 - 52 %   MCV 100.9 (H) 80.0 - 100.0 fL   MCH 37.7 (H) 26.0 - 34.0 pg   MCHC 37.3 (H) 30.0 - 36.0 g/dL   RDW 65.7 (H) 84.6 - 96.2 %   Platelets 361 150 - 400 K/uL   nRBC 8.6 (H) 0.0 - 0.2 %   Neutrophils Relative % 61 %   Neutro Abs 12.0 (H) 1.7 - 7.7 K/uL   Lymphocytes Relative 29 %   Lymphs Abs 5.6 (H) 0.7 - 4.0 K/uL   Monocytes Relative 8 %   Monocytes Absolute 1.5 (H) 0.1 - 1.0 K/uL   Eosinophils Relative 1 %   Eosinophils Absolute 0.2 0 - 0 K/uL   Basophils Relative  0 %   Basophils Absolute 0.1 0 - 0 K/uL   Immature Granulocytes 1 %   Abs Immature Granulocytes 0.10 (H) 0.00 - 0.07 K/uL   Agglutination PRESENT    Carollee Massed Bodies PRESENT    Polychromasia MARKED    Sickle Cells MARKED    Target Cells PRESENT   Reticulocytes     Status: Abnormal   Collection Time: 01/29/20  7:46 AM  Result Value Ref Range   Retic Ct Pct 18.9 (H) 0.4 - 3.1 %   RBC. 2.15 (L) 4.22 - 5.81 MIL/uL   Retic Count, Absolute 407.0 (H) 19.0 - 186.0 K/uL   Immature Retic Fract 41.6 (H) 2.3 - 15.9 %    Imaging results:  DG Chest Port 1 View  Result Date: 01/29/2020 CLINICAL DATA:  Shortness of breath.  Sickle cell pain. EXAM: PORTABLE CHEST 1 VIEW COMPARISON:  01/02/2020 FINDINGS: 0703 hours. Cardiopericardial silhouette is at upper limits of normal for size. Interstitial markings are diffusely coarsened with chronic features. No focal airspace consolidation. No pulmonary edema or pleural effusion. Right Port-A-Cath again noted. The visualized bony structures of the thorax show no acute abnormality. IMPRESSION: No active disease. Electronically Signed   By: Kennith Center M.D.   On: 01/29/2020 07:46     Assessment & Plan: Patient admitted to sickle cell day infusion center for management of pain crisis.  Patient is opiate tolerant Initiate IV dilaudid PCA. Settings of 0.5, 10 minute lockout, and  3 mg/hr  IV fluids, 0.45 % saline at 100 ml/hr  Tylenol 1000 mg by mouth times one dose Reviewed all laboratory values, largely consistent with patient's baseline, labs do not warrant repeating at this time.  Pain intensity will be reevaluated in context of functioning and relationship to baseline as care progress If pain intensity remains elevated and/or sudden change in hemodynamic stability transition to inpatient services for higher level of care.   Nolon NationsLachina Moore Adonus Uselman  APRN, [name], [name] Patient Care [facility] [address]  [place], [place]  [id number] [phone]   01/29/2020, 9:24 AM

## 2020-01-29 NOTE — ED Triage Notes (Signed)
Pt reports "pain all over" that he attributes to sickle cell. States taht he took his home meds without relief. His O2 sats were 83-85% in triage. He states that they normally run low at night. Pt placed on 2L.

## 2020-02-08 ENCOUNTER — Telehealth: Payer: Self-pay | Admitting: Family Medicine

## 2020-02-08 ENCOUNTER — Other Ambulatory Visit: Payer: Self-pay

## 2020-02-08 NOTE — Telephone Encounter (Signed)
rx refill perc & hydroxy

## 2020-02-12 ENCOUNTER — Emergency Department (HOSPITAL_COMMUNITY)
Admission: EM | Admit: 2020-02-12 | Discharge: 2020-02-12 | Disposition: A | Discharge status: Home / Self Care | Payer: Medicaid Other | Attending: Emergency Medicine | Admitting: Emergency Medicine

## 2020-02-12 ENCOUNTER — Telehealth: Payer: Self-pay | Admitting: Nurse Practitioner

## 2020-02-12 ENCOUNTER — Encounter (HOSPITAL_COMMUNITY): Payer: Self-pay | Admitting: Emergency Medicine

## 2020-02-12 ENCOUNTER — Other Ambulatory Visit: Payer: Self-pay | Admitting: Nurse Practitioner

## 2020-02-12 DIAGNOSIS — Z7901 Long term (current) use of anticoagulants: Secondary | ICD-10-CM | POA: Diagnosis not present

## 2020-02-12 DIAGNOSIS — G8929 Other chronic pain: Secondary | ICD-10-CM

## 2020-02-12 DIAGNOSIS — D57219 Sickle-cell/Hb-C disease with crisis, unspecified: Secondary | ICD-10-CM | POA: Diagnosis not present

## 2020-02-12 DIAGNOSIS — D5700 Hb-SS disease with crisis, unspecified: Secondary | ICD-10-CM

## 2020-02-12 DIAGNOSIS — D571 Sickle-cell disease without crisis: Secondary | ICD-10-CM

## 2020-02-12 DIAGNOSIS — F419 Anxiety disorder, unspecified: Secondary | ICD-10-CM

## 2020-02-12 LAB — COMPREHENSIVE METABOLIC PANEL
ALT: 29 U/L (ref 0–44)
AST: 48 U/L — ABNORMAL HIGH (ref 15–41)
Albumin: 4 g/dL (ref 3.5–5.0)
Alkaline Phosphatase: 53 U/L (ref 38–126)
Anion gap: 9 (ref 5–15)
BUN: 15 mg/dL (ref 6–20)
CO2: 24 mmol/L (ref 22–32)
Calcium: 9.1 mg/dL (ref 8.9–10.3)
Chloride: 103 mmol/L (ref 98–111)
Creatinine, Ser: 1.17 mg/dL (ref 0.61–1.24)
GFR, Estimated: >60 mL/min (ref >60)
Glucose, Bld: 105 mg/dL — ABNORMAL HIGH (ref 70–99)
Potassium: 3.3 mmol/L — ABNORMAL LOW (ref 3.5–5.1)
Sodium: 136 mmol/L (ref 135–145)
Total Bilirubin: 4.6 mg/dL — ABNORMAL HIGH (ref 0.3–1.2)
Total Protein: 9.6 g/dL — ABNORMAL HIGH (ref 6.5–8.1)

## 2020-02-12 LAB — CBC WITH DIFFERENTIAL/PLATELET
Abs Immature Granulocytes: 0.14 10*3/uL — ABNORMAL HIGH (ref 0.00–0.07)
Basophils Absolute: 0.1 10*3/uL (ref 0.0–0.1)
Basophils Relative: 0 %
Eosinophils Absolute: 0.1 10*3/uL (ref 0.0–0.5)
Eosinophils Relative: 0 %
HCT: 20.2 % — ABNORMAL LOW (ref 39.0–52.0)
Hemoglobin: 7.4 g/dL — ABNORMAL LOW (ref 13.0–17.0)
Immature Granulocytes: 1 %
Lymphocytes Relative: 47 %
Lymphs Abs: 8.5 10*3/uL — ABNORMAL HIGH (ref 0.7–4.0)
MCH: 37.6 pg — ABNORMAL HIGH (ref 26.0–34.0)
MCHC: 36.6 g/dL — ABNORMAL HIGH (ref 30.0–36.0)
MCV: 102.5 fL — ABNORMAL HIGH (ref 80.0–100.0)
Monocytes Absolute: 1.1 10*3/uL — ABNORMAL HIGH (ref 0.1–1.0)
Monocytes Relative: 6 %
Neutro Abs: 8.3 10*3/uL — ABNORMAL HIGH (ref 1.7–7.7)
Neutrophils Relative %: 46 %
Platelets: 512 10*3/uL — ABNORMAL HIGH (ref 150–400)
RBC: 1.97 MIL/uL — ABNORMAL LOW (ref 4.22–5.81)
RDW: 27.1 % — ABNORMAL HIGH (ref 11.5–15.5)
WBC: 16.9 10*3/uL — ABNORMAL HIGH (ref 4.0–10.5)
nRBC: 26.9 % — ABNORMAL HIGH (ref 0.0–0.2)

## 2020-02-12 LAB — RETICULOCYTES
Immature Retic Fract: 37.5 % — ABNORMAL HIGH (ref 2.3–15.9)
RBC.: 1.8 MIL/uL — ABNORMAL LOW (ref 4.22–5.81)
Retic Count, Absolute: 308.5 10*3/uL — ABNORMAL HIGH (ref 19.0–186.0)
Retic Ct Pct: 17.1 % — ABNORMAL HIGH (ref 0.4–3.1)

## 2020-02-12 MED ORDER — DIPHENHYDRAMINE HCL 25 MG PO CAPS
25.0000 mg | ORAL_CAPSULE | ORAL | Status: DC | PRN
  Administered 2020-02-12: 25 mg via ORAL
  Filled 2020-02-12: qty 1

## 2020-02-12 MED ORDER — ALPRAZOLAM 1 MG PO TABS: ORAL_TABLET | ORAL | 0 refills | Status: DC

## 2020-02-12 MED ORDER — OXYCODONE HCL 5 MG PO TABS
15.0000 mg | ORAL_TABLET | Freq: Once | ORAL | Status: AC
  Administered 2020-02-12: 15 mg via ORAL
  Filled 2020-02-12: qty 3

## 2020-02-12 MED ORDER — HYDROMORPHONE HCL 2 MG/ML IJ SOLN
2.0000 mg | INTRAMUSCULAR | Status: AC
  Administered 2020-02-12: 2 mg via INTRAVENOUS
  Filled 2020-02-12: qty 1

## 2020-02-12 MED ORDER — HEPARIN SOD (PORK) LOCK FLUSH 100 UNIT/ML IV SOLN
500.0000 [IU] | Freq: Once | INTRAVENOUS | Status: AC
  Administered 2020-02-12: 500 [IU]
  Filled 2020-02-12: qty 5

## 2020-02-12 MED ORDER — OXYCODONE-ACETAMINOPHEN 10-325 MG PO TABS: 1.0000 | ORAL_TABLET | ORAL | 0 refills | Status: DC | PRN

## 2020-02-12 MED FILL — ALPRAZOLAM 1 MG TABS: 1 | 10 days supply | Qty: 30 | Fill #0

## 2020-02-12 MED FILL — OXYCODONE-APAP 10-325: 10-325 | 15 days supply | Qty: 90 | Fill #0

## 2020-02-12 NOTE — ED Provider Notes (Signed)
Care assumed from [facility], [place], at shift change, please see their notes for full documentation of patient's complaint/HPI. Briefly, pt here with generalized pain s/2 SCC which is pt's typical pain pattern. Results so far show labwork unremarkable. Pt has received 2 doses of dilaudid without improvement. Awaiting for sickle cell day clinic to open at 8 AM for admission. Plan is to call at 8 AM.   Physical Exam  BP 102/70   Pulse 73   Temp 98.4 F (36.9 C) (Oral)   Resp 14   SpO2 97%   Physical Exam Vitals and nursing note reviewed.  Constitutional:      Appearance: He is not ill-appearing.  HENT:     Head: Normocephalic and atraumatic.  Eyes:     Conjunctiva/sclera: Conjunctivae normal.  Cardiovascular:     Rate and Rhythm: Normal rate and regular rhythm.  Pulmonary:     Effort: Pulmonary effort is normal.     Breath sounds: Normal breath sounds.  Skin:    General: Skin is warm and dry.     Coloration: Skin is not jaundiced.  Neurological:     Mental Status: He is alert.     ED Course/Procedures     Procedures  Results for orders placed or performed during the hospital encounter of 02/12/20  Comprehensive metabolic panel  Result Value Ref Range   Sodium 136 135 - 145 mmol/L   Potassium 3.3 (L) 3.5 - 5.1 mmol/L   Chloride 103 98 - 111 mmol/L   CO2 24 22 - 32 mmol/L   Glucose, Bld 105 (H) 70 - 99 mg/dL   BUN 15 6 - 20 mg/dL   Creatinine, Ser 6.29 0.61 - 1.24 mg/dL   Calcium 9.1 8.9 - 52.8 mg/dL   Total Protein 9.6 (H) 6.5 - 8.1 g/dL   Albumin 4.0 3.5 - 5.0 g/dL   AST 48 (H) 15 - 41 U/L   ALT 29 0 - 44 U/L   Alkaline Phosphatase 53 38 - 126 U/L   Total Bilirubin 4.6 (H) 0.3 - 1.2 mg/dL   GFR, Estimated >41 >32 mL/min   Anion gap 9 5 - 15  Reticulocytes  Result Value Ref Range   Retic Ct Pct 17.1 (H) 0.4 - 3.1 %   RBC. 1.80 (L) 4.22 - 5.81 MIL/uL   Retic Count, Absolute 308.5 (H) 19.0 - 186.0 K/uL   Immature Retic Fract 37.5 (H) 2.3 - 15.9 %  CBC WITH  DIFFERENTIAL  Result Value Ref Range   WBC 16.9 (H) 4.0 - 10.5 K/uL   RBC 1.97 (L) 4.22 - 5.81 MIL/uL   Hemoglobin 7.4 (L) 13.0 - 17.0 g/dL   HCT 44.0 (L) 39 - 52 %   MCV 102.5 (H) 80.0 - 100.0 fL   MCH 37.6 (H) 26.0 - 34.0 pg   MCHC 36.6 (H) 30.0 - 36.0 g/dL   RDW 10.2 (H) 72.5 - 36.6 %   Platelets 512 (H) 150 - 400 K/uL   nRBC 26.9 (H) 0.0 - 0.2 %   Neutrophils Relative % 46 %   Neutro Abs 8.3 (H) 1.7 - 7.7 K/uL   Lymphocytes Relative 47 %   Lymphs Abs 8.5 (H) 0.7 - 4.0 K/uL   Monocytes Relative 6 %   Monocytes Absolute 1.1 (H) 0.1 - 1.0 K/uL   Eosinophils Relative 0 %   Eosinophils Absolute 0.1 0.0 - 0.5 K/uL   Basophils Relative 0 %   Basophils Absolute 0.1 0.0 - 0.1 K/uL  WBC Morphology ABSOLUTE LYMPHOCYTOSIS    Immature Granulocytes 1 %   Abs Immature Granulocytes 0.14 (H) 0.00 - 0.07 K/uL   Agglutination PRESENT    Polychromasia MARKED    Sickle Cells MARKED    Target Cells PRESENT     MDM  Nursing staff made aware that the day clinic is not accepting patient today due to staffing issues. Upon reassessment pt has been able to tolerate food and coke at bedside without difficulty however reports he is still in pain. He is unsure if he wants to be admitted to the hospital however. Will plan for oral dose of medication and for reassessment. Pt is in agreement with this plan.   On reevaluation pt reports pain is more controlled; he continues to not want to be admitted to the hospital and would like to try to manage his pain in the outpatient setting. Feel this is reasonable; will discharge home at this time.   This note was prepared using Dragon voice recognition software and may include unintentional dictation errors due to the inherent limitations of voice recognition software.      Tanda Rockers, PA-C 02/12/20 [id number]    Terald Sleeper, MD 02/12/20 [phone]

## 2020-02-12 NOTE — ED Provider Notes (Signed)
[facility]-EMERGENCY DEPT Provider Note   CSN: [id number] Arrival date & time: 02/12/20  0443     History Chief Complaint  Patient presents with  . Sickle Cell Pain Crisis    Martin [name] is a 29 y.o. male.  Patient to ED with generalized pain from sickle cell disease, which is his typical pain pattern. No specific chest pain, SOB, cough, fever or vomiting. Home medications are not providing relief.   The history is provided by the patient. No language interpreter was used.  Sickle Cell Pain Crisis Associated symptoms: no congestion, no cough, no fever and no shortness of breath        Past Medical History:  Diagnosis Date  . Anxiety   . HIV (human immunodeficiency virus infection) (HCC)   . Proteinuria   . Sickle cell crisis (HCC)   . 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HCC)   . Vitamin D deficiency 10/2018    Patient Active Problem List   Diagnosis Date Noted  . Leukocytosis 01/02/2020  . Anxiety 11/27/2019  . Proteinuria 11/27/2019  . Scleral icterus   . Chronic, continuous use of opioids 08/15/2019  . Seasonal allergies 08/15/2019  . Hypoxia   . Chest congestion   . Sickle cell anemia with crisis (HCC) 08/04/2019  . Other chronic pain 08/04/2019  . History of pulmonary embolus (PE) 08/04/2019  . Acute pulmonary embolism (HCC) 02/10/2019  . Acute chest syndrome due to sickle cell crisis (HCC) 02/10/2019  . Sickle cell crisis (HCC) 01/17/2019  . Heart murmur 02/03/2017  . Sickle cell disease with crisis (HCC) 02/02/2017  . 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 Sickle cell pain crisis (HCC) 02/02/2017  . 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HCC) 12/20/2016  . Vitamin D deficiency 08/14/2016  . High risk medication use 09/27/2014  . Generalized anxiety disorder 05/19/2014  . GERD (gastroesophageal reflux disease) 05/19/2014  . HIV (human immunodeficiency virus infection) (HCC) 05/23/2012    Past Surgical History:  Procedure Laterality Date  . IR  IMAGING GUIDED PORT INSERTION  08/29/2019       Family History  Problem Relation Age of Onset  . Sickle cell trait Mother   . Sickle cell trait Father   . Birth defects Maternal Grandmother   . Birth defects Paternal Grandmother     Social History   Tobacco Use  . Smoking status: Never Smoker  . Smokeless tobacco: Never Used  Vaping Use  . Vaping Use: Some days  . Substances: THC  Substance Use Topics  . Alcohol use: No  . Drug use: Yes    Types: Marijuana    Home Medications Prior to Admission medications   Medication Sig Start Date End Date Taking? Authorizing Provider  abacavir-dolutegravir-lamiVUDine (TRIUMEQ) 600-50-300 MG tablet Take 1 tablet by mouth at bedtime.     [provider]  ALPRAZolam Prudy Feeler) 1 MG tablet Take 1 tablet by mouth 3 times daily as needed for anxiety or sleep. Patient taking differently: Take 1 mg by mouth 3 (three) times daily as needed for anxiety or sleep.  01/17/20 02/16/20  [name], [name]  apixaban (ELIQUIS) 5 MG TABS tablet Take 1 tablet (5 mg total) by mouth 2 (two) times daily. 08/21/19   Massie Maroon, [name]  cetirizine (ZYRTEC) 10 MG tablet Take 1 tablet (10 mg total) by mouth daily. Patient taking differently: Take 10 mg by mouth daily as needed for allergies.  08/04/19   [name], [name]  guaiFENesin-dextromethorphan (ROBITUSSIN DM) 100-10 MG/5ML syrup Take  5 mLs by mouth every 4 (four) hours as needed for cough. 11/23/19   Massie Maroon, [name]  hydroxyurea (HYDREA) 500 MG capsule Take 4 capsules (2,000 mg total) by mouth at bedtime. 12/08/19   [name], [name]  Multiple Vitamin (MULTIVITAMIN WITH MINERALS) TABS tablet Take 1 tablet by mouth daily with breakfast.     [provider]  naloxone (NARCAN) 0.4 MG/ML injection As needed. Patient taking differently: Place 0.4 mg into the nose as needed (overdose).  11/28/18   [name], [name]  Vitamin D, Ergocalciferol, (DRISDOL) 1.25 MG (50000 UT) CAPS  capsule Take 1 capsule (50,000 Units total) by mouth every 7 (seven) days. Patient taking differently: Take 50,000 Units by mouth every 7 (seven) days. Every Monday 11/01/18   [name], [name]    Allergies    Tape and Ketorolac  Review of Systems   Review of Systems  Constitutional: Negative for chills and fever.  HENT: Negative.  Negative for congestion.   Respiratory: Negative.  Negative for cough and shortness of breath.   Cardiovascular: Negative.   Gastrointestinal: Negative.   Genitourinary: Negative.   Musculoskeletal: Positive for arthralgias and myalgias.  Skin: Negative.   Neurological: Negative.  Negative for syncope, weakness and light-headedness.    Physical Exam Updated Vital Signs BP 112/75 (BP Location: Right Arm)   Pulse 88   Temp 98.4 F (36.9 C) (Oral)   Resp 14   SpO2 96%   Physical Exam Vitals and nursing note reviewed.  Constitutional:      Appearance: Normal appearance. He is well-developed. He is not ill-appearing.  HENT:     Head: Normocephalic.  Eyes:     Comments: No conjunctival pallor.  Cardiovascular:     Rate and Rhythm: Normal rate and regular rhythm.  Pulmonary:     Effort: Pulmonary effort is normal.     Breath sounds: Normal breath sounds. No wheezing, rhonchi or rales.     Comments: Port in right chest.  Chest:     Chest wall: No tenderness.  Abdominal:     General: Bowel sounds are normal.     Palpations: Abdomen is soft.     Tenderness: There is no abdominal tenderness. There is no guarding or rebound.  Musculoskeletal:        General: Normal range of motion.     Cervical back: Normal range of motion and neck supple.     Right lower leg: No edema.     Left lower leg: No edema.  Skin:    General: Skin is warm and dry.     Findings: No rash.  Neurological:     Mental Status: He is alert and oriented to person, place, and time.     ED Results / Procedures / Treatments   Labs (all labs ordered are listed, but only  abnormal results are displayed) Labs Reviewed - No data to display  EKG None  Radiology No results found.  Procedures Procedures (including critical care time)  Medications Ordered in ED Medications - No data to display  ED Course  I have reviewed the triage vital signs and the nursing notes.  Pertinent labs & imaging results that were available during my care of the patient were reviewed by me and considered in my medical decision making (see chart for details).    MDM Rules/Calculators/A&P  Patient to ED with pain typical for his sickle cell disease. No fever, SOB, vomiting, specific chest pain.   He is nontoxic in appearance. Looks uncomfortable. Pleasant and cooperative. Sickle cell protocol orders provided.   He has had 2 does of Dilaudid and reports feeling better but still have pain. Will keep him in the ED until 8:00 am when the sickle cell day hospital opens and transfer for further care.   Final Clinical Impression(s) / ED Diagnoses Final diagnoses:  None   1. Sickle cell anemia with pain  Rx / DC Orders ED Discharge Orders    None       Elpidio Anis, PA-C 02/12/20 [id number]    Zadie Rhine, MD 02/12/20 [phone]

## 2020-02-12 NOTE — ED Notes (Signed)
Called sickle cell clinic, clinic states due to staffing they are not seeing patients today. Charge aware and will make MD aware

## 2020-02-12 NOTE — ED Triage Notes (Signed)
Pt reports "pain all over" that he attributes to sickle cell. States taht he took his home meds without relief. Denies SOB or chest pain.

## 2020-02-12 NOTE — Discharge Instructions (Signed)
Please follow up with your PCP regarding your ED visit today Continue taking your home medications as indicated  Return to the ED for any worsening symptoms

## 2020-02-13 NOTE — Telephone Encounter (Signed)
Done

## 2020-02-19 ENCOUNTER — Other Ambulatory Visit: Payer: Self-pay

## 2020-02-19 ENCOUNTER — Encounter (HOSPITAL_COMMUNITY): Payer: Self-pay | Admitting: Emergency Medicine

## 2020-02-19 ENCOUNTER — Inpatient Hospital Stay (HOSPITAL_COMMUNITY)
Admission: EM | Admit: 2020-02-19 | Discharge: 2020-02-23 | DRG: 812 | Disposition: A | Discharge status: Home / Self Care | Payer: Medicaid Other | Attending: Internal Medicine | Admitting: Internal Medicine

## 2020-02-19 ENCOUNTER — Emergency Department (HOSPITAL_COMMUNITY): Payer: Medicaid Other

## 2020-02-19 DIAGNOSIS — Z20822 Contact with and (suspected) exposure to covid-19: Secondary | ICD-10-CM | POA: Diagnosis present

## 2020-02-19 DIAGNOSIS — Z888 Allergy status to other drugs, medicaments and biological substances status: Secondary | ICD-10-CM

## 2020-02-19 DIAGNOSIS — F1120 Opioid dependence, uncomplicated: Secondary | ICD-10-CM | POA: Diagnosis present

## 2020-02-19 DIAGNOSIS — R369 Urethral discharge, unspecified: Secondary | ICD-10-CM | POA: Diagnosis present

## 2020-02-19 DIAGNOSIS — Z91048 Other nonmedicinal substance allergy status: Secondary | ICD-10-CM

## 2020-02-19 DIAGNOSIS — A530 Latent syphilis, unspecified as early or late: Secondary | ICD-10-CM

## 2020-02-19 DIAGNOSIS — D72829 Elevated white blood cell count, unspecified: Secondary | ICD-10-CM | POA: Diagnosis present

## 2020-02-19 DIAGNOSIS — D638 Anemia in other chronic diseases classified elsewhere: Secondary | ICD-10-CM | POA: Diagnosis present

## 2020-02-19 DIAGNOSIS — G894 Chronic pain syndrome: Secondary | ICD-10-CM | POA: Diagnosis present

## 2020-02-19 DIAGNOSIS — Z79899 Other long term (current) drug therapy: Secondary | ICD-10-CM

## 2020-02-19 DIAGNOSIS — D5700 Hb-SS disease with crisis, unspecified: Secondary | ICD-10-CM | POA: Diagnosis present

## 2020-02-19 DIAGNOSIS — R360 Urethral discharge without blood: Secondary | ICD-10-CM

## 2020-02-19 DIAGNOSIS — D5701 Hb-SS disease with acute chest syndrome: Secondary | ICD-10-CM | POA: Diagnosis present

## 2020-02-19 DIAGNOSIS — Z832 Family history of diseases of the blood and blood-forming organs and certain disorders involving the immune mechanism: Secondary | ICD-10-CM

## 2020-02-19 DIAGNOSIS — B20 Human immunodeficiency virus [HIV] disease: Secondary | ICD-10-CM | POA: Diagnosis present

## 2020-02-19 DIAGNOSIS — F419 Anxiety disorder, unspecified: Secondary | ICD-10-CM | POA: Diagnosis present

## 2020-02-19 DIAGNOSIS — Z86711 Personal history of pulmonary embolism: Secondary | ICD-10-CM | POA: Diagnosis not present

## 2020-02-19 DIAGNOSIS — F1190 Opioid use, unspecified, uncomplicated: Secondary | ICD-10-CM | POA: Diagnosis not present

## 2020-02-19 DIAGNOSIS — L293 Anogenital pruritus, unspecified: Secondary | ICD-10-CM

## 2020-02-19 DIAGNOSIS — Z21 Asymptomatic human immunodeficiency virus [HIV] infection status: Secondary | ICD-10-CM | POA: Diagnosis present

## 2020-02-19 DIAGNOSIS — R0902 Hypoxemia: Secondary | ICD-10-CM | POA: Diagnosis present

## 2020-02-19 DIAGNOSIS — Z7901 Long term (current) use of anticoagulants: Secondary | ICD-10-CM

## 2020-02-19 DIAGNOSIS — F411 Generalized anxiety disorder: Secondary | ICD-10-CM | POA: Diagnosis present

## 2020-02-19 LAB — CBC WITH DIFFERENTIAL/PLATELET
Abs Immature Granulocytes: 0.03 10*3/uL (ref 0.00–0.07)
Basophils Absolute: 0.1 10*3/uL (ref 0.0–0.1)
Basophils Relative: 1 %
Eosinophils Absolute: 0.3 10*3/uL (ref 0.0–0.5)
Eosinophils Relative: 2 %
HCT: 22.2 % — ABNORMAL LOW (ref 39.0–52.0)
Hemoglobin: 8.2 g/dL — ABNORMAL LOW (ref 13.0–17.0)
Immature Granulocytes: 0 %
Lymphocytes Relative: 53 %
Lymphs Abs: 7.2 10*3/uL — ABNORMAL HIGH (ref 0.7–4.0)
MCH: 37.6 pg — ABNORMAL HIGH (ref 26.0–34.0)
MCHC: 36.9 g/dL — ABNORMAL HIGH (ref 30.0–36.0)
MCV: 101.8 fL — ABNORMAL HIGH (ref 80.0–100.0)
Monocytes Absolute: 1.2 10*3/uL — ABNORMAL HIGH (ref 0.1–1.0)
Monocytes Relative: 9 %
Neutro Abs: 4.8 10*3/uL (ref 1.7–7.7)
Neutrophils Relative %: 35 %
Platelets: 374 10*3/uL (ref 150–400)
RBC: 2.18 MIL/uL — ABNORMAL LOW (ref 4.22–5.81)
RDW: 23.4 % — ABNORMAL HIGH (ref 11.5–15.5)
WBC: 13.2 10*3/uL — ABNORMAL HIGH (ref 4.0–10.5)
nRBC: 5.9 % — ABNORMAL HIGH (ref 0.0–0.2)

## 2020-02-19 LAB — COMPREHENSIVE METABOLIC PANEL
ALT: 19 U/L (ref 0–44)
AST: 34 U/L (ref 15–41)
Albumin: 3.6 g/dL (ref 3.5–5.0)
Alkaline Phosphatase: 49 U/L (ref 38–126)
Anion gap: 6 (ref 5–15)
BUN: 10 mg/dL (ref 6–20)
CO2: 25 mmol/L (ref 22–32)
Calcium: 8.5 mg/dL — ABNORMAL LOW (ref 8.9–10.3)
Chloride: 109 mmol/L (ref 98–111)
Creatinine, Ser: 0.75 mg/dL (ref 0.61–1.24)
GFR, Estimated: >60 mL/min (ref >60)
Glucose, Bld: 98 mg/dL (ref 70–99)
Potassium: 3.6 mmol/L (ref 3.5–5.1)
Sodium: 140 mmol/L (ref 135–145)
Total Bilirubin: 5.7 mg/dL — ABNORMAL HIGH (ref 0.3–1.2)
Total Protein: 8.2 g/dL — ABNORMAL HIGH (ref 6.5–8.1)

## 2020-02-19 LAB — URINALYSIS, COMPLETE (UACMP) WITH MICROSCOPIC
Bacteria, UA: NONE SEEN
Bilirubin Urine: NEGATIVE
Glucose, UA: NEGATIVE mg/dL
Hgb urine dipstick: NEGATIVE
Ketones, ur: NEGATIVE mg/dL
Nitrite: NEGATIVE
Protein, ur: NEGATIVE mg/dL
Specific Gravity, Urine: 1.011 (ref 1.005–1.030)
pH: 6 (ref 5.0–8.0)

## 2020-02-19 LAB — RESPIRATORY PANEL BY RT PCR (FLU A&B, COVID)
Influenza A by PCR: NEGATIVE
Influenza B by PCR: NEGATIVE
SARS Coronavirus 2 by RT PCR: NEGATIVE

## 2020-02-19 LAB — RETICULOCYTES
Immature Retic Fract: 21.7 % — ABNORMAL HIGH (ref 2.3–15.9)
RBC.: 2.3 MIL/uL — ABNORMAL LOW (ref 4.22–5.81)
Retic Count, Absolute: 715.5 10*3/uL — ABNORMAL HIGH (ref 19.0–186.0)
Retic Ct Pct: >30 % — ABNORMAL HIGH (ref 0.4–3.1)

## 2020-02-19 MED ORDER — HYDROMORPHONE 1 MG/ML IV SOLN
INTRAVENOUS | Status: DC
  Administered 2020-02-19: 7 mg via INTRAVENOUS
  Administered 2020-02-19: 3.5 mg via INTRAVENOUS
  Administered 2020-02-20: 5 mg via INTRAVENOUS
  Administered 2020-02-20 (×2): 6 mg via INTRAVENOUS
  Administered 2020-02-20: 7.5 mg via INTRAVENOUS
  Administered 2020-02-20: 3 mg via INTRAVENOUS
  Administered 2020-02-20: 5 mg via INTRAVENOUS
  Administered 2020-02-20: 7.5 mg via INTRAVENOUS
  Administered 2020-02-21: 5.5 mg via INTRAVENOUS
  Administered 2020-02-21: 2 mg via INTRAVENOUS
  Administered 2020-02-21: 7.5 mg via INTRAVENOUS
  Filled 2020-02-19 (×3): qty 30

## 2020-02-19 MED ORDER — POLYETHYLENE GLYCOL 3350 17 G PO PACK: 17.0000 g | PACK | Freq: Every day | ORAL | Status: DC | PRN

## 2020-02-19 MED ORDER — SENNOSIDES-DOCUSATE SODIUM 8.6-50 MG PO TABS
1.0000 | ORAL_TABLET | Freq: Two times a day (BID) | ORAL | Status: DC
  Administered 2020-02-19 – 2020-02-23 (×9): 1 via ORAL
  Filled 2020-02-19 (×9): qty 1

## 2020-02-19 MED ORDER — ABACAVIR-DOLUTEGRAVIR-LAMIVUD 600-50-300 MG PO TABS
1.0000 | ORAL_TABLET | Freq: Every day | ORAL | Status: DC
  Administered 2020-02-19 – 2020-02-22 (×4): 1 via ORAL
  Filled 2020-02-19 (×4): qty 1

## 2020-02-19 MED ORDER — HYDROMORPHONE HCL 2 MG/ML IJ SOLN: 2.0000 mg | INTRAMUSCULAR | Status: DC | PRN

## 2020-02-19 MED ORDER — HYDROMORPHONE HCL 2 MG/ML IJ SOLN
2.0000 mg | INTRAMUSCULAR | Status: AC
  Administered 2020-02-19: 2 mg via INTRAVENOUS
  Filled 2020-02-19: qty 1

## 2020-02-19 MED ORDER — ONDANSETRON HCL 4 MG/2ML IJ SOLN: 4.0000 mg | INTRAMUSCULAR | Status: DC | PRN

## 2020-02-19 MED ORDER — SODIUM CHLORIDE 0.9 % IV SOLN
1.0000 g | INTRAVENOUS | Status: DC
  Administered 2020-02-20 – 2020-02-21 (×2): 1 g via INTRAVENOUS
  Filled 2020-02-19 (×2): qty 1

## 2020-02-19 MED ORDER — HYDROXYUREA 500 MG PO CAPS
2000.0000 mg | ORAL_CAPSULE | Freq: Every day | ORAL | Status: DC
  Administered 2020-02-19 – 2020-02-22 (×4): 2000 mg via ORAL
  Filled 2020-02-19 (×4): qty 4

## 2020-02-19 MED ORDER — SODIUM CHLORIDE 0.9 % IV SOLN
1.0000 g | Freq: Once | INTRAVENOUS | Status: AC
  Administered 2020-02-19: 1 g via INTRAVENOUS
  Filled 2020-02-19: qty 10

## 2020-02-19 MED ORDER — APIXABAN 5 MG PO TABS
5.0000 mg | ORAL_TABLET | Freq: Two times a day (BID) | ORAL | Status: DC
  Administered 2020-02-19 – 2020-02-23 (×9): 5 mg via ORAL
  Filled 2020-02-19 (×9): qty 1

## 2020-02-19 MED ORDER — AZITHROMYCIN 250 MG PO TABS
500.0000 mg | ORAL_TABLET | Freq: Every day | ORAL | Status: DC
  Administered 2020-02-20 – 2020-02-21 (×2): 500 mg via ORAL
  Filled 2020-02-19 (×2): qty 2

## 2020-02-19 MED ORDER — ONDANSETRON HCL 4 MG/2ML IJ SOLN
4.0000 mg | Freq: Four times a day (QID) | INTRAMUSCULAR | Status: DC | PRN
  Administered 2020-02-21: 4 mg via INTRAVENOUS
  Filled 2020-02-19: qty 2

## 2020-02-19 MED ORDER — NALOXONE HCL 0.4 MG/ML IJ SOLN: 0.4000 mg | INTRAMUSCULAR | Status: DC | PRN

## 2020-02-19 MED ORDER — SODIUM CHLORIDE 0.45 % IV SOLN: INTRAVENOUS | Status: DC

## 2020-02-19 MED ORDER — DIPHENHYDRAMINE HCL 50 MG/ML IJ SOLN
25.0000 mg | Freq: Once | INTRAMUSCULAR | Status: AC
  Administered 2020-02-19: 25 mg via INTRAVENOUS
  Filled 2020-02-19: qty 1

## 2020-02-19 MED ORDER — AZITHROMYCIN 250 MG PO TABS
500.0000 mg | ORAL_TABLET | Freq: Once | ORAL | Status: AC
  Administered 2020-02-19: 500 mg via ORAL
  Filled 2020-02-19: qty 2

## 2020-02-19 MED ORDER — FLEET ENEMA 7-19 GM/118ML RE ENEM
1.0000 | ENEMA | Freq: Once | RECTAL | Status: AC
  Administered 2020-02-19: 1 via RECTAL
  Filled 2020-02-19: qty 1

## 2020-02-19 MED ORDER — DIPHENHYDRAMINE HCL 25 MG PO CAPS
25.0000 mg | ORAL_CAPSULE | ORAL | Status: DC | PRN
  Administered 2020-02-20: 25 mg via ORAL
  Filled 2020-02-19: qty 1

## 2020-02-19 MED ORDER — SODIUM CHLORIDE 0.45 % IV SOLN: INTRAVENOUS | Status: AC

## 2020-02-19 MED ORDER — SODIUM CHLORIDE 0.9% FLUSH: 9.0000 mL | INTRAVENOUS | Status: DC | PRN

## 2020-02-19 MED ORDER — LIP MEDEX EX OINT
TOPICAL_OINTMENT | CUTANEOUS | Status: DC | PRN
  Filled 2020-02-19 (×3): qty 7

## 2020-02-19 MED ORDER — ADULT MULTIVITAMIN W/MINERALS CH
1.0000 | ORAL_TABLET | Freq: Every day | ORAL | Status: DC
  Administered 2020-02-20 – 2020-02-23 (×4): 1 via ORAL
  Filled 2020-02-19 (×4): qty 1

## 2020-02-19 MED ORDER — ALPRAZOLAM 0.25 MG PO TABS
0.2500 mg | ORAL_TABLET | Freq: Every evening | ORAL | Status: DC | PRN
  Administered 2020-02-20 – 2020-02-21 (×2): 0.25 mg via ORAL
  Filled 2020-02-19 (×2): qty 1

## 2020-02-19 NOTE — ED Provider Notes (Signed)
[facility]-EMERGENCY DEPT Provider Note   CSN: [id number] Arrival date & time: 02/19/20  0546     History Chief Complaint  Patient presents with  . Sickle Cell Pain Crisis    Martin [name] is a 29 y.o. male.  The history is provided by the patient.  Sickle Cell Pain Crisis Location:  Diffuse Duration:  1 day Similar to previous crisis episodes: yes   Timing:  Constant Progression:  Worsening Chronicity:  Recurrent Context: not cold exposure, not dehydration and not infection   Relieved by:  Nothing Worsened by:  Nothing Ineffective treatments:  Prescription drugs Associated symptoms: no cough, no fever and no shortness of breath   last ed visit 10/25.  Hx of acute chest.  No pe before.     Past Medical History:  Diagnosis Date  . Anxiety   . HIV (human immunodeficiency virus infection) (HCC)   . Proteinuria   . Sickle cell crisis (HCC)   . 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HCC)   . Vitamin D deficiency 10/2018    Patient Active Problem List   Diagnosis Date Noted  . Leukocytosis 01/02/2020  . Anxiety 11/27/2019  . Proteinuria 11/27/2019  . Scleral icterus   . Chronic, continuous use of opioids 08/15/2019  . Seasonal allergies 08/15/2019  . Hypoxia   . Chest congestion   . Sickle cell anemia with crisis (HCC) 08/04/2019  . Other chronic pain 08/04/2019  . History of pulmonary embolus (PE) 08/04/2019  . Acute pulmonary embolism (HCC) 02/10/2019  . Acute chest syndrome due to sickle cell crisis (HCC) 02/10/2019  . Sickle cell crisis (HCC) 01/17/2019  . Heart murmur 02/03/2017  . Sickle cell disease with crisis (HCC) 02/02/2017  . 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 Sickle cell pain crisis (HCC) 02/02/2017  . 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HCC) 12/20/2016  . Vitamin D deficiency 08/14/2016  . High risk medication use 09/27/2014  . Generalized anxiety disorder 05/19/2014  . GERD (gastroesophageal reflux disease) 05/19/2014  . HIV (human  immunodeficiency virus infection) (HCC) 05/23/2012    Past Surgical History:  Procedure Laterality Date  . IR IMAGING GUIDED PORT INSERTION  08/29/2019       Family History  Problem Relation Age of Onset  . Sickle cell trait Mother   . Sickle cell trait Father   . Birth defects Maternal Grandmother   . Birth defects Paternal Grandmother     Social History   Tobacco Use  . Smoking status: Never Smoker  . Smokeless tobacco: Never Used  Vaping Use  . Vaping Use: Some days  . Substances: THC  Substance Use Topics  . Alcohol use: No  . Drug use: Yes    Types: Marijuana    Home Medications Prior to Admission medications   Medication Sig Start Date End Date Taking? Authorizing Provider  abacavir-dolutegravir-lamiVUDine (TRIUMEQ) 600-50-300 MG tablet Take 1 tablet by mouth at bedtime.    Yes [provider]  ALPRAZolam Prudy Feeler) 1 MG tablet Take 1 tablet by mouth 3 times daily as needed for anxiety or sleep. 02/16/20 03/13/20 Yes Barbette Merino, NP  apixaban (ELIQUIS) 5 MG TABS tablet Take 1 tablet (5 mg total) by mouth 2 (two) times daily. 08/21/19  Yes Massie Maroon, [name]  cetirizine (ZYRTEC) 10 MG tablet Take 1 tablet (10 mg total) by mouth daily. Patient taking differently: Take 10 mg by mouth daily as needed for allergies.  08/04/19  Yes [name], [name]  guaiFENesin-dextromethorphan (ROBITUSSIN DM) 100-10 MG/5ML syrup  Take 5 mLs by mouth every 4 (four) hours as needed for cough. 11/23/19  Yes Massie Maroon, [name]  hydroxyurea (HYDREA) 500 MG capsule Take 4 capsules (2,000 mg total) by mouth at bedtime. 12/08/19  Yes [name], [name]  Multiple Vitamin (MULTIVITAMIN WITH MINERALS) TABS tablet Take 1 tablet by mouth daily with breakfast.    Yes [provider]  naloxone (NARCAN) 0.4 MG/ML injection As needed. Patient taking differently: Place 0.4 mg into the nose as needed (overdose).  11/28/18  Yes [name], [name]  oxyCODONE-acetaminophen  (PERCOCET) 10-325 MG tablet Take 1 tablet by mouth every 4 (four) hours as needed for up to 15 days for pain. 02/12/20 02/27/20 Yes King, Shana Chute, NP  Vitamin D, Ergocalciferol, (DRISDOL) 1.25 MG (50000 UT) CAPS capsule Take 1 capsule (50,000 Units total) by mouth every 7 (seven) days. Patient not taking: Reported on 02/19/2020 11/01/18   [name], [name]    Allergies    Tape and Ketorolac  Review of Systems   Review of Systems  Constitutional: Negative for fever.  Respiratory: Negative for cough and shortness of breath.   All other systems reviewed and are negative.   Physical Exam Updated Vital Signs BP 104/70   Pulse 63   Temp 97.9 F (36.6 C) (Oral)   Resp 12   Ht 1.905 m (6\' 3" )   Wt 68 kg   SpO2 90%   BMI 18.75 kg/m   Physical Exam Vitals and nursing note reviewed.  Constitutional:      Appearance: He is well-developed. He is ill-appearing. He is not diaphoretic.  HENT:     Head: Normocephalic and atraumatic.     Right Ear: External ear normal.     Left Ear: External ear normal.  Eyes:     General: No scleral icterus.       Right eye: No discharge.        Left eye: No discharge.     Conjunctiva/sclera: Conjunctivae normal.  Neck:     Trachea: No tracheal deviation.  Cardiovascular:     Rate and Rhythm: Normal rate and regular rhythm.  Pulmonary:     Effort: Pulmonary effort is normal. No respiratory distress.     Breath sounds: Normal breath sounds. No stridor. No wheezing or rales.     Comments: Decreased o2 sat noted on triage vitals Abdominal:     General: Bowel sounds are normal. There is no distension.     Palpations: Abdomen is soft.     Tenderness: There is no abdominal tenderness. There is no guarding or rebound.  Musculoskeletal:        General: No tenderness.     Cervical back: Neck supple.  Skin:    General: Skin is warm and dry.     Findings: No rash.  Neurological:     Mental Status: He is alert.     Cranial Nerves: No cranial nerve  deficit (no facial droop, extraocular movements intact, no slurred speech).     Sensory: No sensory deficit.     Motor: No abnormal muscle tone or seizure activity.     Coordination: Coordination normal.     ED Results / Procedures / Treatments   Labs (all labs ordered are listed, but only abnormal results are displayed) Labs Reviewed  COMPREHENSIVE METABOLIC PANEL - Abnormal; Notable for the following components:      Result Value   Calcium 8.5 (*)    Total Protein 8.2 (*)  Total Bilirubin 5.7 (*)    All other components within normal limits  CBC WITH DIFFERENTIAL/PLATELET - Abnormal; Notable for the following components:   WBC 13.2 (*)    RBC 2.18 (*)    Hemoglobin 8.2 (*)    HCT 22.2 (*)    MCV 101.8 (*)    MCH 37.6 (*)    MCHC 36.9 (*)    RDW 23.4 (*)    nRBC 5.9 (*)    Lymphs Abs 7.2 (*)    Monocytes Absolute 1.2 (*)    All other components within normal limits  RETICULOCYTES - Abnormal; Notable for the following components:   Retic Ct Pct >30.0 (*)    RBC. 2.30 (*)    Retic Count, Absolute 715.5 (*)    Immature Retic Fract 21.7 (*)    All other components within normal limits    EKG None  Radiology DG Chest Port 1 View  Result Date: 02/19/2020 CLINICAL DATA:  Hypoxia. Additional history provided: Chest pain due to sickle cell pain crisis EXAM: PORTABLE CHEST 1 VIEW COMPARISON:  Prior chest radiographs 01/29/2020 and earlier. FINDINGS: Unchanged position of a right chest infusion port catheter. Heart size at the upper limits of normal. Suspected subtle patchy airspace disease within the mid to lower lung fields bilaterally. Unchanged background chronic prominence of the interstitial lung markings/pulmonary scarring. No evidence of pleural effusion or pneumothorax. No acute bony abnormality identified. IMPRESSION: Suspected subtle patchy airspace disease within the mid to lower lung fields bilaterally. Stable background chronic interstitial prominence/pulmonary  scarring. Electronically Signed   By: Jackey LogeKyle  Golden DO   On: 02/19/2020 08:05    Procedures .Critical Care Performed by: Linwood DibblesKnapp, Colm Lyford, MD Authorized by: Linwood DibblesKnapp, Chadrick Sprinkle, MD   Critical care provider statement:    Critical care time (minutes):  45   Critical care was time spent personally by me on the following activities:  Discussions with consultants, evaluation of patient's response to treatment, examination of patient, ordering and performing treatments and interventions, ordering and review of laboratory studies, ordering and review of radiographic studies, pulse oximetry, re-evaluation of patient's condition, obtaining history from patient or surrogate and review of old charts   (including critical care time)  Medications Ordered in ED Medications  0.45 % sodium chloride infusion ( Intravenous New Bag/Given 02/19/20 0820)  ondansetron (ZOFRAN) injection 4 mg (has no administration in time range)  cefTRIAXone (ROCEPHIN) 1 g in sodium chloride 0.9 % 100 mL IVPB (has no administration in time range)  azithromycin (ZITHROMAX) tablet 500 mg (has no administration in time range)  HYDROmorphone (DILAUDID) injection 2 mg (has no administration in time range)  HYDROmorphone (DILAUDID) injection 2 mg (2 mg Intravenous Given 02/19/20 0816)  HYDROmorphone (DILAUDID) injection 2 mg (2 mg Intravenous Given 02/19/20 0938)  diphenhydrAMINE (BENADRYL) injection 25 mg (25 mg Intravenous Given 02/19/20 0815)    ED Course  I have reviewed the triage vital signs and the nursing notes.  Pertinent labs & imaging results that were available during my care of the patient were reviewed by me and considered in my medical decision making (see chart for details).  Clinical Course as of Feb 19 1023  Mon Feb 19, 2020  1000 Labs reviewed.  Hemoglobin is stable.  Electrolyte panel unremarkable.   [JK]  1000 Chest x-ray shows airspace disease.  Findings concerning for acute chest syndrome considering his hypoxia   [JK]   1015 O2 sats remained in the high 80s.  We will increase his supplemental oxygen.  I will consult with the skull service for admission   [JK]  1023 Discussed with [place] Hollis.  Will evaluate pt for admission   [JK]    Clinical Course User Index [JK] Linwood Dibbles, MD   MDM Rules/Calculators/A&P                         DDx:  Sickle cell crisis, ?acute chest , ?pe, ?pna  With decreased o2 sat  Patient presented with typical pain associated with sickle cell pain crisis.  Patient was also noted to have a new oxygen requirement.  Chest x-ray does show infiltrates.  Patient's presentation is concerning for acute chest syndrome with his oxygen requirement and pulmonary infiltrates.  Patient has been started on IV pain medications per sickle cell pain management algorithm.  He continues to have pain.  I will also start the patient on empiric antibiotics.  Plan on admission for further treatment.   Final Clinical Impression(s) / ED Diagnoses Final diagnoses:  Acute chest syndrome (HCC)  Sickle cell crisis (HCC)      Linwood Dibbles, MD 02/19/20 1024

## 2020-02-19 NOTE — ED Triage Notes (Signed)
Pt reports pain all over that he attributes to sickle cell. He states that he tried to take his home meds, but has been vomiting. Denies SOB.

## 2020-02-19 NOTE — Progress Notes (Signed)
Report received from Cameron RN.  

## 2020-02-19 NOTE — H&P (Signed)
H&P  Patient Demographics:  Martin [name], is a 29 y.o. male  MRN: [id number]   DOB - 04-27-1990  Admit Date - 02/19/2020  Outpatient Primary MD for the patient is [name], [name]  Chief Complaint  Patient presents with  . Sickle Cell Pain Crisis      HPI:   Martin [name]  is a 29 y.o. male with a medical history significant for sickle cell disease, chronic pain syndrome, opiate dependence and tolerance, history of PE on Eliquis, HIV disease, generalized anxiety, and history of anemia of chronic disease presented to the emergency department with generalized pain primarily to mid chest that is consistent with his previous pain crisis.  Patient states that he awakened overnight with increased body pain and some shortness of breath.  Pain was unrelieved by home medications.  He attributes pain crisis to changes in weather.  Patient's pain intensity is 8/10 characterized as constant and throbbing.  While in the emergency department, patient is requiring 2 L supplemental oxygen to maintain oxygen saturation above 90%.  Patient denies fever, chills, headache, dizziness, urinary symptoms, nausea, vomiting, or diarrhea.  No sick contacts, recent travel, or exposure to COVID-19.  ER course:  Vital signs show: BP 104/70   Pulse 63   Temp 97.9 F (36.6 C) (Oral)   Resp 12   Ht 6\' 3"  (1.905 m)   Wt 68 kg   SpO2 90%   BMI 18.75 kg/m . Patient's oxygen saturation is 90% on 2 liters supplemental oxygen. WBCs 13.2, patient is afebrile. Hemoglobin 8.2, which is consistent with patient's baseline.  Total bilirubin elevated at 5.7.  Chest x-ray shows suspected subtle patchy airspace disease within the mid to lower lung field bilaterally.  Stable background chronic interstitial prominence and pulmonary scarring.  IV ceftriaxone and oral azithromycin initiated in the emergency department.  COVID-19 test negative.  Pain persists despite IV Dilaudid, IV fluids, and IV Toradol.  Patient admitted to  MedSurg for management of CAP versus acute chest syndrome in the setting of sickle cell pain crisis.   Review of systems:  Review of Systems  Constitutional: Positive for malaise/fatigue. Negative for chills and fever.  HENT: Negative.   Eyes: Negative.   Respiratory: Positive for shortness of breath. Negative for cough and sputum production.   Cardiovascular: Positive for chest pain. Negative for palpitations and orthopnea.  Gastrointestinal: Negative.   Genitourinary: Negative for dysuria and urgency.  Musculoskeletal: Positive for back pain and joint pain.  Skin: Negative.   Neurological: Negative.   Endo/Heme/Allergies: Negative.   Psychiatric/Behavioral: Negative.     With Past History of the following :   Past Medical History:  Diagnosis Date  . Anxiety   . HIV (human immunodeficiency virus infection) (HCC)   . Proteinuria   . Sickle cell crisis (HCC)   . 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HCC)   . Vitamin D deficiency 10/2018      Past Surgical History:  Procedure Laterality Date  . IR IMAGING GUIDED PORT INSERTION  08/29/2019     Social History:   Social History   Tobacco Use  . Smoking status: Never Smoker  . Smokeless tobacco: Never Used  Substance Use Topics  . Alcohol use: No     Lives - At home   Family History :   Family History  Problem Relation Age of Onset  . Sickle cell trait Mother   . Sickle cell trait Father   . Birth defects Maternal Grandmother   . Birth defects  Paternal Grandmother      Home Medications:   Prior to Admission medications   Medication Sig Start Date End Date Taking? Authorizing Provider  abacavir-dolutegravir-lamiVUDine (TRIUMEQ) 600-50-300 MG tablet Take 1 tablet by mouth at bedtime.    Yes [provider]  ALPRAZolam Prudy Feeler) 1 MG tablet Take 1 tablet by mouth 3 times daily as needed for anxiety or sleep. 02/16/20 03/13/20 Yes Barbette Merino, NP  apixaban (ELIQUIS) 5 MG TABS tablet Take 1 tablet (5 mg total) by mouth 2  (two) times daily. 08/21/19  Yes Massie Maroon, [name]  cetirizine (ZYRTEC) 10 MG tablet Take 1 tablet (10 mg total) by mouth daily. Patient taking differently: Take 10 mg by mouth daily as needed for allergies.  08/04/19  Yes [name], [name]  guaiFENesin-dextromethorphan (ROBITUSSIN DM) 100-10 MG/5ML syrup Take 5 mLs by mouth every 4 (four) hours as needed for cough. 11/23/19  Yes Massie Maroon, [name]  hydroxyurea (HYDREA) 500 MG capsule Take 4 capsules (2,000 mg total) by mouth at bedtime. 12/08/19  Yes [name], [name]  Multiple Vitamin (MULTIVITAMIN WITH MINERALS) TABS tablet Take 1 tablet by mouth daily with breakfast.    Yes [provider]  naloxone (NARCAN) 0.4 MG/ML injection As needed. Patient taking differently: Place 0.4 mg into the nose as needed (overdose).  11/28/18  Yes [name], [name]  oxyCODONE-acetaminophen (PERCOCET) 10-325 MG tablet Take 1 tablet by mouth every 4 (four) hours as needed for up to 15 days for pain. 02/12/20 02/27/20 Yes King, Shana Chute, NP  Vitamin D, Ergocalciferol, (DRISDOL) 1.25 MG (50000 UT) CAPS capsule Take 1 capsule (50,000 Units total) by mouth every 7 (seven) days. Patient not taking: Reported on 02/19/2020 11/01/18   [name], [name]     Allergies:   Allergies  Allergen Reactions  . Tape Rash and Other (See Comments)    PLEASE DO NOT USE THE CLEAR, THICK, "PLASTIC" TAPE- only paper tape is tolerated   . Ketorolac Swelling and Other (See Comments)    Patient reports facial edema and left arm edema after administration.     Physical Exam:   Vitals:   Vitals:   02/19/20 0945 02/19/20 1000  BP: 100/73 104/70  Pulse: 64 63  Resp: 11 12  Temp:    SpO2: 96% 90%    Physical Exam: Constitutional: Patient appears well-developed and well-nourished. Not in obvious distress. HENT: Normocephalic, atraumatic, External right and left ear normal. Oropharynx is clear and moist.  Eyes: Conjunctivae and EOM are normal.  PERRLA, no scleral icterus. Neck: Normal ROM. Neck supple. No JVD. No tracheal deviation. No thyromegaly. CVS: RRR, S1/S2 +, no murmurs, no gallops, no carotid bruit.  Pulmonary: Effort and breath sounds normal, no stridor, rhonchi, wheezes, rales.  Abdominal: Soft. BS +, no distension, tenderness, rebound or guarding.  Musculoskeletal: Normal range of motion. No edema and no tenderness.  Lymphadenopathy: No lymphadenopathy noted, cervical, inguinal or axillary Neuro: Alert. Normal reflexes, muscle tone coordination. No cranial nerve deficit. Skin: Skin is warm and dry. No rash noted. Not diaphoretic. No erythema. No pallor. Psychiatric: Normal mood and affect. Behavior, judgment, thought content normal.   Data Review:   CBC Recent Labs  Lab 02/19/20 0758  WBC 13.2*  HGB 8.2*  HCT 22.2*  PLT 374  MCV 101.8*  MCH 37.6*  MCHC 36.9*  RDW 23.4*  LYMPHSABS 7.2*  MONOABS 1.2*  EOSABS 0.3  BASOSABS 0.1   ------------------------------------------------------------------------------------------------------------------  Chemistries  Recent Labs  Lab 02/19/20 0758  NA 140  K 3.6  CL 109  CO2 25  GLUCOSE 98  BUN 10  CREATININE 0.75  CALCIUM 8.5*  AST 34  ALT 19  ALKPHOS 49  BILITOT 5.7*   ------------------------------------------------------------------------------------------------------------------ estimated creatinine clearance is 129.9 mL/min (by C-G formula based on SCr of 0.75 mg/dL). ------------------------------------------------------------------------------------------------------------------ No results for input(s): TSH, T4TOTAL, T3FREE, THYROIDAB in the last 72 hours.  Invalid input(s): FREET3  Coagulation profile No results for input(s): INR, PROTIME in the last 168 hours. ------------------------------------------------------------------------------------------------------------------- No results for input(s): DDIMER in the last 72  hours. -------------------------------------------------------------------------------------------------------------------  Cardiac Enzymes No results for input(s): CKMB, TROPONINI, MYOGLOBIN in the last 168 hours.  Invalid input(s): CK ------------------------------------------------------------------------------------------------------------------ No results found for: BNP  ---------------------------------------------------------------------------------------------------------------  Urinalysis    Component Value Date/Time   COLORURINE YELLOW 01/02/2020 1354   APPEARANCEUR CLEAR 01/02/2020 1354   LABSPEC 1.012 01/02/2020 1354   PHURINE 5.0 01/02/2020 1354   GLUCOSEU NEGATIVE 01/02/2020 1354   HGBUR SMALL (A) 01/02/2020 1354   BILIRUBINUR NEGATIVE 01/02/2020 1354   BILIRUBINUR neg 11/27/2019 1149   KETONESUR NEGATIVE 01/02/2020 1354   PROTEINUR NEGATIVE 01/02/2020 1354   UROBILINOGEN >=8.0 (A) 11/27/2019 1149   UROBILINOGEN 4.0 (H) 05/21/2009 1944   NITRITE NEGATIVE 01/02/2020 1354   LEUKOCYTESUR NEGATIVE 01/02/2020 1354    ----------------------------------------------------------------------------------------------------------------   Imaging Results:    DG Chest Port 1 View  Result Date: 02/19/2020 CLINICAL DATA:  Hypoxia. Additional history provided: Chest pain due to sickle cell pain crisis EXAM: PORTABLE CHEST 1 VIEW COMPARISON:  Prior chest radiographs 01/29/2020 and earlier. FINDINGS: Unchanged position of a right chest infusion port catheter. Heart size at the upper limits of normal. Suspected subtle patchy airspace disease within the mid to lower lung fields bilaterally. Unchanged background chronic prominence of the interstitial lung markings/pulmonary scarring. No evidence of pleural effusion or pneumothorax. No acute bony abnormality identified. IMPRESSION: Suspected subtle patchy airspace disease within the mid to lower lung fields bilaterally. Stable background  chronic interstitial prominence/pulmonary scarring. Electronically Signed   By: Jackey Loge DO   On: 02/19/2020 08:05     Assessment & Plan:  Principal Problem:   Sickle cell crisis acute chest syndrome (HCC) Active Problems:   Generalized anxiety disorder   HIV (human immunodeficiency virus infection) (HCC)   History of pulmonary embolus (PE)   Hypoxia   Chronic, continuous use of opioids   Leukocytosis  Sickle cell pain crisis with acute chest syndrome:  Admit to MedSurg.  Initiate IV Dilaudid PCA with settings of 0.5 mg, 10-minute lockout, and 3 mg/h. IV fluids, 0.45% saline at 150 mL/h. Patient has an oxygen requirement of 2 L, oxygen saturation 87-90%.  Continue to monitor closely.  Continue supplemental oxygen and maintain oxygen saturation above 90 Continue IV ceftriaxone and oral azithromycin Incentive spirometer Hold IV Toradol, listed allergy. Monitor vital signs closely, reevaluate pain scale regularly, and supplemental oxygen as needed.  Sickle cell anemia: Hemoglobin 8.2, consistent with patient's baseline.  There is no clinical indication for blood transfusion at this time.  Patient is typically not transfused unless hemoglobin is less than 6.0 g/dL. Continue folic acid and hydroxyurea.  Leukocytosis: Mild leukocytosis.  Patient is afebrile.  Continue antibiotics.  Monitor closely.  Repeat CBC in a.m.  HIV disease: Continue home medications.  Stable.  Patient is followed by infectious disease as an outpatient.  Chronic pain syndrome: Hold Percocet, use IV Dilaudid PCA.  History of PE: Continue Eliquis 5 mg twice daily  History  of generalized anxiety disorder: Continue Xanax as needed.  Patient denies any suicidal thoughts or intentions.  Clinically stable.  Continue to monitor closely.   DVT Prophylaxis: Continue Eliquis and SCDs  AM Labs Ordered, also please review Full Orders  Family Communication: Admission, patient's condition and plan of care  including tests being ordered have been discussed with the patient who indicate understanding and agree with the plan and Code Status.  Code Status: Full Code  Consults called: None    Admission status: Inpatient    Time spent in minutes : 35 minutes  Nolon NationsLachina Moore Cayce Paschal  APRN, [name], [name] [facility] [address]  [place], [place] [address] [phone]  02/19/2020 at 10:32 AM

## 2020-02-20 LAB — BASIC METABOLIC PANEL
Anion gap: 5 (ref 5–15)
BUN: 8 mg/dL (ref 6–20)
CO2: 25 mmol/L (ref 22–32)
Calcium: 8.4 mg/dL — ABNORMAL LOW (ref 8.9–10.3)
Chloride: 107 mmol/L (ref 98–111)
Creatinine, Ser: 0.73 mg/dL (ref 0.61–1.24)
GFR, Estimated: >60 mL/min (ref >60)
Glucose, Bld: 95 mg/dL (ref 70–99)
Potassium: 4 mmol/L (ref 3.5–5.1)
Sodium: 137 mmol/L (ref 135–145)

## 2020-02-20 LAB — CBC
HCT: 21.5 % — ABNORMAL LOW (ref 39.0–52.0)
Hemoglobin: 7.9 g/dL — ABNORMAL LOW (ref 13.0–17.0)
MCH: 36.9 pg — ABNORMAL HIGH (ref 26.0–34.0)
MCHC: 36.7 g/dL — ABNORMAL HIGH (ref 30.0–36.0)
MCV: 100.5 fL — ABNORMAL HIGH (ref 80.0–100.0)
Platelets: 345 10*3/uL (ref 150–400)
RBC: 2.14 MIL/uL — ABNORMAL LOW (ref 4.22–5.81)
RDW: 22.6 % — ABNORMAL HIGH (ref 11.5–15.5)
WBC: 13.1 10*3/uL — ABNORMAL HIGH (ref 4.0–10.5)
nRBC: 6.1 % — ABNORMAL HIGH (ref 0.0–0.2)

## 2020-02-20 LAB — URINE CULTURE: Culture: NO GROWTH

## 2020-02-20 MED ORDER — CHLORHEXIDINE GLUCONATE CLOTH 2 % EX PADS
6.0000 | MEDICATED_PAD | Freq: Every day | CUTANEOUS | Status: DC
  Administered 2020-02-20 – 2020-02-23 (×4): 6 via TOPICAL

## 2020-02-20 MED ORDER — FLEET ENEMA 7-19 GM/118ML RE ENEM
1.0000 | ENEMA | Freq: Once | RECTAL | Status: AC
  Administered 2020-02-20: 1 via RECTAL
  Filled 2020-02-20: qty 1

## 2020-02-20 NOTE — Progress Notes (Signed)
Subjective: Martin [name] is a 29 year old male with a medical history significant for sickle cell disease, chronic pain syndrome, opiate dependence and tolerance, history of PE on Eliquis, HIV disease, generalized anxiety, and history of anemia of chronic disease was admitted for sickle cell pain crisis.  Patient says that pain has improved some overnight.  Pain intensity is 7/10 primarily to low back and lower extremities.  Patient denies fever, chills, chest pain, shortness of breath, urinary symptoms, nausea, vomiting, or diarrhea.  Oxygen saturation is 97% on RA. Objective:  Vital signs in last 24 hours:  Vitals:   02/20/20 0805 02/20/20 1015 02/20/20 1231 02/20/20 1609  BP:  104/75    Pulse:  66    Resp: 12 11 14 12   Temp:  98.2 F (36.8 C)    TempSrc:  Oral    SpO2: 95% 91% 97% 94%  Weight:      Height:        Intake/Output from previous day:   Intake/Output Summary (Last 24 hours) at 02/20/2020 1825 Last data filed at 02/20/2020 1015 Gross per 24 hour  Intake 254.5 ml  Output 2150 ml  Net -1895.5 ml    Physical Exam: General: Alert, awake, oriented x3, in no acute distress.  HEENT: [place]/AT PEERL, EOMI scleral icterus Neck: Trachea midline,  no masses, no thyromegal,y no JVD, no carotid bruit OROPHARYNX:  Moist, No exudate/ erythema/lesions.  Heart: Regular rate and rhythm, without murmurs, rubs, gallops, PMI non-displaced, no heaves or thrills on palpation.  Lungs: Clear to auscultation, no wheezing or rhonchi noted. No increased vocal fremitus resonant to percussion  Abdomen: Soft, nontender, nondistended, positive bowel sounds, no masses no hepatosplenomegaly noted..  Neuro: No focal neurological deficits noted cranial nerves II through XII grossly intact. DTRs 2+ bilaterally upper and lower extremities. Strength 5 out of 5 in bilateral upper and lower extremities. Musculoskeletal: No warm swelling or erythema around joints, no spinal tenderness noted. Psychiatric:  Patient alert and oriented x3, good insight and cognition, good recent to remote recall. Lymph node survey: No cervical axillary or inguinal lymphadenopathy noted.  Lab Results:  Basic Metabolic Panel:    Component Value Date/Time   NA 137 02/20/2020 0605   NA 136 10/17/2018 1433   K 4.0 02/20/2020 0605   CL 107 02/20/2020 0605   CO2 25 02/20/2020 0605   BUN 8 02/20/2020 0605   BUN 6 10/17/2018 1433   CREATININE 0.73 02/20/2020 0605   GLUCOSE 95 02/20/2020 0605   CALCIUM 8.4 (L) 02/20/2020 0605   CBC:    Component Value Date/Time   WBC 13.1 (H) 02/20/2020 0605   HGB 7.9 (L) 02/20/2020 0605   HGB 9.6 (L) 10/17/2018 1433   HCT 21.5 (L) 02/20/2020 0605   HCT 26.2 (L) 10/17/2018 1433   PLT 345 02/20/2020 0605   PLT 297 10/17/2018 1433   MCV 100.5 (H) 02/20/2020 0605   MCV 108 (H) 10/17/2018 1433   NEUTROABS 4.8 02/19/2020 0758   NEUTROABS 7.0 10/17/2018 1433   LYMPHSABS 7.2 (H) 02/19/2020 0758   LYMPHSABS 4.6 (H) 10/17/2018 1433   MONOABS 1.2 (H) 02/19/2020 0758   EOSABS 0.3 02/19/2020 0758   EOSABS 0.2 10/17/2018 1433   BASOSABS 0.1 02/19/2020 0758   BASOSABS 0.1 10/17/2018 1433    Recent Results (from the past 240 hour(s))  Respiratory Panel by RT PCR (Flu A&B, Covid) - Nasopharyngeal Swab     Status: None   Collection Time: 02/19/20 10:52 AM   Specimen: Nasopharyngeal Swab  Result Value Ref Range Status   SARS Coronavirus 2 by RT PCR NEGATIVE NEGATIVE Final    Comment: (NOTE) SARS-CoV-2 target nucleic acids are NOT DETECTED.  The SARS-CoV-2 RNA is generally detectable in upper respiratoy specimens during the acute phase of infection. The lowest concentration of SARS-CoV-2 viral copies this assay can detect is 131 copies/mL. A negative result does not preclude SARS-Cov-2 infection and should not be used as the sole basis for treatment or other patient management decisions. A negative result may occur with  improper specimen collection/handling, submission of  specimen other than nasopharyngeal swab, presence of viral mutation(s) within the areas targeted by this assay, and inadequate number of viral copies (<131 copies/mL). A negative result must be combined with clinical observations, patient history, and epidemiological information. The expected result is Negative.  Fact Sheet for Patients:  https://www.moore.com/  Fact Sheet for Healthcare Providers:  https://www.young.biz/  This test is no t yet approved or cleared by the [place] FDA and  has been authorized for detection and/or diagnosis of SARS-CoV-2 by FDA under an Emergency Use Authorization (EUA). This EUA will remain  in effect (meaning this test can be used) for the duration of the COVID-19 declaration under Section 564(b)(1) of the Act, 21 U.S.C. section 360bbb-3(b)(1), unless the authorization is terminated or revoked sooner.     Influenza A by PCR NEGATIVE NEGATIVE Final   Influenza B by PCR NEGATIVE NEGATIVE Final    Comment: (NOTE) The Xpert Xpress SARS-CoV-2/FLU/RSV assay is intended as an aid in  the diagnosis of influenza from Nasopharyngeal swab specimens and  should not be used as a sole basis for treatment. Nasal washings and  aspirates are unacceptable for Xpert Xpress SARS-CoV-2/FLU/RSV  testing.  Fact Sheet for Patients: https://www.moore.com/  Fact Sheet for Healthcare Providers: https://www.young.biz/  This test is not yet approved or cleared by the [place] FDA and  has been authorized for detection and/or diagnosis of SARS-CoV-2 by  FDA under an Emergency Use Authorization (EUA). This EUA will remain  in effect (meaning this test can be used) for the duration of the  Covid-19 declaration under Section 564(b)(1) of the Act, 21  U.S.C. section 360bbb-3(b)(1), unless the authorization is  terminated or revoked. Performed at [facility], [address] [place], [place] [address]   Urine culture     Status: None   Collection Time: 02/19/20  3:10 PM   Specimen: Urine, Random  Result Value Ref Range Status   Specimen Description   Final    URINE, RANDOM Performed at [facility], [address] [place], [place] [address]    Special Requests   Final    NONE Performed at [facility], [address] [place], [place] [address]    Culture   Final    NO GROWTH Performed at [facility], [address], [place], [place] [address]    Report Status 02/20/2020 FINAL  Final    Studies/Results: DG Chest Port 1 View  Result Date: 02/19/2020 CLINICAL DATA:  Hypoxia. Additional history provided: Chest pain due to sickle cell pain crisis EXAM: PORTABLE CHEST 1 VIEW COMPARISON:  Prior chest radiographs 01/29/2020 and earlier. FINDINGS: Unchanged position of a right chest infusion port catheter. Heart size at the upper limits of normal. Suspected subtle patchy airspace disease within the mid to lower lung fields bilaterally. Unchanged background chronic prominence of the interstitial lung markings/pulmonary scarring. No evidence of pleural effusion or pneumothorax. No  acute bony abnormality identified. IMPRESSION: Suspected subtle patchy airspace disease within the mid to lower lung fields bilaterally. Stable background chronic interstitial prominence/pulmonary scarring. Electronically Signed   By: Jackey Loge DO   On: 02/19/2020 08:05    Medications: Scheduled Meds: . abacavir-dolutegravir-lamiVUDine  1 tablet Oral QHS  . apixaban  5 mg Oral BID  . azithromycin  500 mg Oral Daily  . Chlorhexidine Gluconate Cloth  6 each Topical Daily  . HYDROmorphone   Intravenous Q4H  . hydroxyurea  2,000 mg Oral QHS  . multivitamin with minerals  1 tablet Oral Q breakfast  . senna-docusate  1 tablet Oral BID   Continuous Infusions: . cefTRIAXone (ROCEPHIN)  IV 1 g (02/20/20 0935)   PRN  Meds:.ALPRAZolam, diphenhydrAMINE, HYDROmorphone (DILAUDID) injection, lip balm, naloxone **AND** sodium chloride flush, ondansetron, ondansetron (ZOFRAN) IV, polyethylene glycol  Consultants:  None  Procedures:  None  Antibiotics:  IV Ceftriaxone  PO azithromycin  Assessment/Plan: Principal Problem:   Acute chest syndrome (HCC) Active Problems:   Generalized anxiety disorder   HIV (human immunodeficiency virus infection) (HCC)   History of pulmonary embolus (PE)   Hypoxia   Chronic, continuous use of opioids   Leukocytosis  Sickle cell pain crisis with acute chest syndrome: Decrease IV fluids to [facility] Patient no longer has an oxygen requirement.  Oxygen saturation is 97% on RA.  Continue to monitor closely. Incentive spirometer Continue IV Dilaudid PCA for pain control, no changes in settings on today Continue IV ceftriaxone and oral azithromycin.  Patient afebrile overnight, consider transitioning to all oral medications. Monitor vital signs closely, reevaluate pain scale regularly, and supplemental oxygen as needed.  Sickle cell anemia: Hemoglobin stable and consistent with patient's baseline.  There is no indication for blood transfusion at this time.  Continue to follow closely.  CBC in a.m.  Leukocytosis: Mild leukocytosis.  Patient continues to be afebrile.  Continue antibiotics.  Follow CBC in a.m.  Chronic pain syndrome: Continue home medications  HIV disease: Stable.  Continue home medications.  History of generalized anxiety disorder: Continue Xanax as needed.  Patient denies any suicidal or homicidal ideations.  Continue to follow closely.  History of PE: Continue Eliquis   Code Status: Full Code Family Communication: N/A Disposition Plan: Not yet ready for discharge  Trany Chernick Rennis Petty  APRN, [name], [name] [facility] [address] [place], [place] [address] [phone]  If 5PM-8AM, please contact  night-coverage.  02/20/2020, 6:25 PM  LOS: 1 day

## 2020-02-21 ENCOUNTER — Inpatient Hospital Stay (HOSPITAL_COMMUNITY): Payer: Medicaid Other

## 2020-02-21 LAB — CBC
HCT: 22.2 % — ABNORMAL LOW (ref 39.0–52.0)
Hemoglobin: 8.1 g/dL — ABNORMAL LOW (ref 13.0–17.0)
MCH: 36.7 pg — ABNORMAL HIGH (ref 26.0–34.0)
MCHC: 36.5 g/dL — ABNORMAL HIGH (ref 30.0–36.0)
MCV: 100.5 fL — ABNORMAL HIGH (ref 80.0–100.0)
Platelets: 366 10*3/uL (ref 150–400)
RBC: 2.21 MIL/uL — ABNORMAL LOW (ref 4.22–5.81)
RDW: 22.2 % — ABNORMAL HIGH (ref 11.5–15.5)
WBC: 11.2 10*3/uL — ABNORMAL HIGH (ref 4.0–10.5)
nRBC: 6.6 % — ABNORMAL HIGH (ref 0.0–0.2)

## 2020-02-21 MED ORDER — HYDROMORPHONE 1 MG/ML IV SOLN
INTRAVENOUS | Status: DC
  Administered 2020-02-21: 8.5 mg via INTRAVENOUS
  Administered 2020-02-22: 4 mg via INTRAVENOUS
  Administered 2020-02-22: 7.5 mg via INTRAVENOUS
  Administered 2020-02-22: 8 mg via INTRAVENOUS
  Administered 2020-02-22 (×2): 3.5 mg via INTRAVENOUS
  Administered 2020-02-23: 12.5 mg via INTRAVENOUS
  Administered 2020-02-23: 5 mg via INTRAVENOUS
  Filled 2020-02-21 (×2): qty 30

## 2020-02-21 MED ORDER — OXYCODONE HCL 5 MG PO TABS
5.0000 mg | ORAL_TABLET | ORAL | Status: DC | PRN
  Administered 2020-02-21 – 2020-02-23 (×9): 5 mg via ORAL
  Filled 2020-02-21 (×9): qty 1

## 2020-02-21 MED ORDER — OXYCODONE-ACETAMINOPHEN 5-325 MG PO TABS
1.0000 | ORAL_TABLET | ORAL | Status: DC | PRN
  Administered 2020-02-21 – 2020-02-23 (×7): 1 via ORAL
  Filled 2020-02-21 (×7): qty 1

## 2020-02-21 NOTE — Progress Notes (Signed)
Subjective: Martin [name] is a 29 year old male with a medical history significant for sickle cell disease, chronic pain syndrome, opiate dependence and tolerance, history of PE on Eliquis, HIV disease, and generalized anxiety disorder was admitted for sickle cell pain crisis.  Patient continues to have pain primarily to low back and lower extremities.  Pain intensity is 7/10.  Patient has not attempted to ambulate without assistance.  He denies any fever, chills, chest pain, shortness of breath, urinary symptoms, nausea, vomiting, or diarrhea.  Oxygen saturation is 95% on RA.  Objective:  Vital signs in last 24 hours:  Vitals:   02/21/20 0743 02/21/20 0959 02/21/20 1207 02/21/20 1516  BP:  107/77    Pulse:  67    Resp: 14 11 12 12   Temp:  98.4 F (36.9 C)    TempSrc:  Oral    SpO2: 95% 97% 95% 96%  Weight:      Height:        Intake/Output from previous day:   Intake/Output Summary (Last 24 hours) at 02/21/2020 1519 Last data filed at 02/21/2020 1000 Gross per 24 hour  Intake --  Output 1800 ml  Net -1800 ml    Physical Exam: General: Alert, awake, oriented x3, in no acute distress.  HEENT: [place]/AT PEERL, EOMI Neck: Trachea midline,  no masses, no thyromegal,y no JVD, no carotid bruit OROPHARYNX:  Moist, No exudate/ erythema/lesions.  Heart: Regular rate and rhythm, without murmurs, rubs, gallops, PMI non-displaced, no heaves or thrills on palpation.  Lungs: Clear to auscultation, no wheezing or rhonchi noted. No increased vocal fremitus resonant to percussion  Abdomen: Soft, nontender, nondistended, positive bowel sounds, no masses no hepatosplenomegaly noted..  Neuro: No focal neurological deficits noted cranial nerves II through XII grossly intact. DTRs 2+ bilaterally upper and lower extremities. Strength 5 out of 5 in bilateral upper and lower extremities. Musculoskeletal: No warm swelling or erythema around joints, no spinal tenderness noted. Psychiatric: Patient  alert and oriented x3, good insight and cognition, good recent to remote recall. Lymph node survey: No cervical axillary or inguinal lymphadenopathy noted.  Lab Results:  Basic Metabolic Panel:    Component Value Date/Time   NA 137 02/20/2020 0605   NA 136 10/17/2018 1433   K 4.0 02/20/2020 0605   CL 107 02/20/2020 0605   CO2 25 02/20/2020 0605   BUN 8 02/20/2020 0605   BUN 6 10/17/2018 1433   CREATININE 0.73 02/20/2020 0605   GLUCOSE 95 02/20/2020 0605   CALCIUM 8.4 (L) 02/20/2020 0605   CBC:    Component Value Date/Time   WBC 11.2 (H) 02/21/2020 0445   HGB 8.1 (L) 02/21/2020 0445   HGB 9.6 (L) 10/17/2018 1433   HCT 22.2 (L) 02/21/2020 0445   HCT 26.2 (L) 10/17/2018 1433   PLT 366 02/21/2020 0445   PLT 297 10/17/2018 1433   MCV 100.5 (H) 02/21/2020 0445   MCV 108 (H) 10/17/2018 1433   NEUTROABS 4.8 02/19/2020 0758   NEUTROABS 7.0 10/17/2018 1433   LYMPHSABS 7.2 (H) 02/19/2020 0758   LYMPHSABS 4.6 (H) 10/17/2018 1433   MONOABS 1.2 (H) 02/19/2020 0758   EOSABS 0.3 02/19/2020 0758   EOSABS 0.2 10/17/2018 1433   BASOSABS 0.1 02/19/2020 0758   BASOSABS 0.1 10/17/2018 1433    Recent Results (from the past 240 hour(s))  Respiratory Panel by RT PCR (Flu A&B, Covid) - Nasopharyngeal Swab     Status: None   Collection Time: 02/19/20 10:52 AM   Specimen: Nasopharyngeal Swab  Result Value Ref Range Status   SARS Coronavirus 2 by RT PCR NEGATIVE NEGATIVE Final    Comment: (NOTE) SARS-CoV-2 target nucleic acids are NOT DETECTED.  The SARS-CoV-2 RNA is generally detectable in upper respiratoy specimens during the acute phase of infection. The lowest concentration of SARS-CoV-2 viral copies this assay can detect is 131 copies/mL. A negative result does not preclude SARS-Cov-2 infection and should not be used as the sole basis for treatment or other patient management decisions. A negative result may occur with  improper specimen collection/handling, submission of specimen  other than nasopharyngeal swab, presence of viral mutation(s) within the areas targeted by this assay, and inadequate number of viral copies (<131 copies/mL). A negative result must be combined with clinical observations, patient history, and epidemiological information. The expected result is Negative.  Fact Sheet for Patients:  https://www.moore.com/  Fact Sheet for Healthcare Providers:  https://www.young.biz/  This test is no t yet approved or cleared by the [place] FDA and  has been authorized for detection and/or diagnosis of SARS-CoV-2 by FDA under an Emergency Use Authorization (EUA). This EUA will remain  in effect (meaning this test can be used) for the duration of the COVID-19 declaration under Section 564(b)(1) of the Act, 21 U.S.C. section 360bbb-3(b)(1), unless the authorization is terminated or revoked sooner.     Influenza A by PCR NEGATIVE NEGATIVE Final   Influenza B by PCR NEGATIVE NEGATIVE Final    Comment: (NOTE) The Xpert Xpress SARS-CoV-2/FLU/RSV assay is intended as an aid in  the diagnosis of influenza from Nasopharyngeal swab specimens and  should not be used as a sole basis for treatment. Nasal washings and  aspirates are unacceptable for Xpert Xpress SARS-CoV-2/FLU/RSV  testing.  Fact Sheet for Patients: https://www.moore.com/  Fact Sheet for Healthcare Providers: https://www.young.biz/  This test is not yet approved or cleared by the [place] FDA and  has been authorized for detection and/or diagnosis of SARS-CoV-2 by  FDA under an Emergency Use Authorization (EUA). This EUA will remain  in effect (meaning this test can be used) for the duration of the  Covid-19 declaration under Section 564(b)(1) of the Act, 21  U.S.C. section 360bbb-3(b)(1), unless the authorization is  terminated or revoked. Performed at [facility], [address] [place], [place] [address]   Urine culture     Status: None   Collection Time: 02/19/20  3:10 PM   Specimen: Urine, Random  Result Value Ref Range Status   Specimen Description   Final    URINE, RANDOM Performed at [facility], [address], [place], [place] [address]    Special Requests   Final    NONE Performed at [facility], [name], [place], [place] [address]    Culture   Final    NO GROWTH Performed at [facility], [address] [place] , [place] [address]    Report Status 02/20/2020 FINAL  Final    Studies/Results: DG Chest 2 View  Result Date: 02/21/2020 CLINICAL DATA:  29 year old male with sickle cell disease. HIV. Pain symptoms. EXAM: CHEST - 2 VIEW COMPARISON:  Portable chest 02/19/2020 and earlier. FINDINGS: PA and lateral views now. Stable right chest power port, accessed. Cardiac size is within normal limits. Other mediastinal contours are within normal limits. Visualized tracheal air column is within normal limits. No pneumothorax or pleural effusion. No consolidation. Coarse bilateral pulmonary interstitial opacity appears stable over this series of exams. No new  pulmonary opacity. Stable visualized osseous structures. Paucity of bowel gas in the upper abdomen. IMPRESSION: Coarse pulmonary interstitial changes are chronic and stable. No acute cardiopulmonary abnormality. Electronically Signed   By: Odessa Fleming M.D.   On: 02/21/2020 14:52    Medications: Scheduled Meds: . abacavir-dolutegravir-lamiVUDine  1 tablet Oral QHS  . apixaban  5 mg Oral BID  . azithromycin  500 mg Oral Daily  . Chlorhexidine Gluconate Cloth  6 each Topical Daily  . HYDROmorphone   Intravenous Q4H  . hydroxyurea  2,000 mg Oral QHS  . multivitamin with minerals  1 tablet Oral Q breakfast  . senna-docusate  1 tablet Oral BID   Continuous Infusions: . cefTRIAXone (ROCEPHIN)  IV 1 g (02/21/20 0934)   PRN Meds:.ALPRAZolam,  diphenhydrAMINE, lip balm, naloxone **AND** sodium chloride flush, ondansetron, ondansetron (ZOFRAN) IV, oxyCODONE-acetaminophen **AND** oxyCODONE, polyethylene glycol  Consultants:  None  Procedures:  None  Antibiotics:  Discontinue IV ceftriaxone and azithromycin  Assessment/Plan: Principal Problem:   Acute chest syndrome (HCC) Active Problems:   Generalized anxiety disorder   HIV (human immunodeficiency virus infection) (HCC)   History of pulmonary embolus (PE)   Hypoxia   Chronic, continuous use of opioids   Leukocytosis  Sickle cell disease with acute chest syndrome: Continue IV fluids at [facility].  There is no supplemental oxygen requirement.  Oxygen saturation is 95% on RA. Continue incentive spirometry. Weaning IV Dilaudid PCA, settings decreased to 0.5 mg, 10-minute lockout, and 2 mg/h. Start Percocet 10-3 25 every 4 hours as needed for severe breakthrough pain Discontinue IV antibiotics.  Repeated chest x-ray, showed no acute cardiopulmonary process.  Coarse pulmonary interstitial changes are chronic and stable. Monitor vital signs closely, reevaluate pain scale regularly, and supplemental oxygen as needed.  Sickle cell anemia: Hemoglobin is stable and consistent with patient's baseline.  There is no clinical indication for blood transfusion at this time.  Continue to follow closely.  Leukocytosis: Improving.  Patient afebrile overnight.  IV antibiotics discontinued.  Continue to follow closely.  Repeat CBC in a.m.  Chronic pain syndrome: Continue home medications  HIV disease: Stable.  Continue home medications  History of generalized anxiety disorder Continue Xanax as needed.  Patient denies any suicidal homicidal intentions at this time.  Continue to follow closely.  History of PE: Continue Eliquis   Code Status: Full Code Family Communication: N/A Disposition Plan: Possible discharge on 02/22/2020`    [name]  APRN, [name], [name] Patient  Care [facility] [address] [place], [place] [address] [phone]  If 5PM-8AM, please contact night-coverage.  02/21/2020, 3:19 PM  LOS: 2 days

## 2020-02-22 DIAGNOSIS — R360 Urethral discharge without blood: Secondary | ICD-10-CM

## 2020-02-22 DIAGNOSIS — L293 Anogenital pruritus, unspecified: Secondary | ICD-10-CM

## 2020-02-22 DIAGNOSIS — D5700 Hb-SS disease with crisis, unspecified: Secondary | ICD-10-CM

## 2020-02-22 DIAGNOSIS — A530 Latent syphilis, unspecified as early or late: Secondary | ICD-10-CM

## 2020-02-22 DIAGNOSIS — R0902 Hypoxemia: Secondary | ICD-10-CM

## 2020-02-22 LAB — RPR
RPR Ser Ql: REACTIVE — AB
RPR Titer: 1:1 {titer}

## 2020-02-22 MED ORDER — LIP MEDEX EX OINT: TOPICAL_OINTMENT | CUTANEOUS | Status: DC | PRN

## 2020-02-22 MED ORDER — FLEET ENEMA 7-19 GM/118ML RE ENEM
1.0000 | ENEMA | Freq: Once | RECTAL | Status: AC
  Administered 2020-02-22: 1 via RECTAL
  Filled 2020-02-22: qty 1

## 2020-02-22 MED ORDER — HYDROXYZINE HCL 25 MG PO TABS
25.0000 mg | ORAL_TABLET | Freq: Three times a day (TID) | ORAL | Status: DC | PRN
  Administered 2020-02-22: 25 mg via ORAL
  Filled 2020-02-22: qty 1

## 2020-02-22 NOTE — Progress Notes (Signed)
Chaplain engaged in initial visit with [name] who wants to be called "Martin [name]."  Chaplain explained her role and offered support and prayer.  Martin [name] noted the significance of prayer and being raised on prayer.  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    Chaplain will follow-up.      02/22/20 1200  Clinical Encounter Type  Visited With Patient  Visit Type Initial

## 2020-02-22 NOTE — Progress Notes (Signed)
Subjective: Martin [name] is a 29 year old male with a medical history significant for sickle cell disease, chronic pain syndrome, opiate dependence and tolerance, history of PE on Eliquis, HIV disease, and generalized anxiety disorder was admitted for sickle cell pain crisis.   Patient says that pain has improved some overnight. Pain intensity is 6/10 primarily to low back and lower extremities.  Patient also complaining of genital itching. Patient had unprotected sexual intercourse 1 week ago. Also, patient endorses some clear penile discharge.   He denies fever, chills, headache, chest pain, nausea, vomiting, or diarrhea.   Objective:  Vital signs in last 24 hours:  Vitals:   02/22/20 0700 02/22/20 0839 02/22/20 1040 02/22/20 1201  BP: 106/67  111/79   Pulse: 68  80   Resp: 15 15 10 12   Temp: 98.2 F (36.8 C)  98.3 F (36.8 C)   TempSrc: Oral  Oral   SpO2: 98% 100% 97% 94%  Weight:      Height:        Intake/Output from previous day:   Intake/Output Summary (Last 24 hours) at 02/22/2020 1323 Last data filed at 02/22/2020 1200 Gross per 24 hour  Intake 368.5 ml  Output 2700 ml  Net -2331.5 ml    Physical Exam: General: Alert, awake, oriented x3, in no acute distress.  HEENT: [place]/AT PEERL, EOMI Neck: Trachea midline,  no masses, no thyromegal,y no JVD, no carotid bruit OROPHARYNX:  Moist, No exudate/ erythema/lesions.  Heart: Regular rate and rhythm, without murmurs, rubs, gallops, PMI non-displaced, no heaves or thrills on palpation.  Lungs: Clear to auscultation, no wheezing or rhonchi noted. No increased vocal fremitus resonant to percussion  Abdomen: Soft, nontender, nondistended, positive bowel sounds, no masses no hepatosplenomegaly noted..  Neuro: No focal neurological deficits noted cranial nerves II through XII grossly intact. DTRs 2+ bilaterally upper and lower extremities. Strength 5 out of 5 in bilateral upper and lower extremities. Musculoskeletal: No warm  swelling or erythema around joints, no spinal tenderness noted. Psychiatric: Patient alert and oriented x3, good insight and cognition, good recent to remote recall. Lymph node survey: No cervical axillary or inguinal lymphadenopathy noted.  Lab Results:  Basic Metabolic Panel:    Component Value Date/Time   NA 137 02/20/2020 0605   NA 136 10/17/2018 1433   K 4.0 02/20/2020 0605   CL 107 02/20/2020 0605   CO2 25 02/20/2020 0605   BUN 8 02/20/2020 0605   BUN 6 10/17/2018 1433   CREATININE 0.73 02/20/2020 0605   GLUCOSE 95 02/20/2020 0605   CALCIUM 8.4 (L) 02/20/2020 0605   CBC:    Component Value Date/Time   WBC 11.2 (H) 02/21/2020 0445   HGB 8.1 (L) 02/21/2020 0445   HGB 9.6 (L) 10/17/2018 1433   HCT 22.2 (L) 02/21/2020 0445   HCT 26.2 (L) 10/17/2018 1433   PLT 366 02/21/2020 0445   PLT 297 10/17/2018 1433   MCV 100.5 (H) 02/21/2020 0445   MCV 108 (H) 10/17/2018 1433   NEUTROABS 4.8 02/19/2020 0758   NEUTROABS 7.0 10/17/2018 1433   LYMPHSABS 7.2 (H) 02/19/2020 0758   LYMPHSABS 4.6 (H) 10/17/2018 1433   MONOABS 1.2 (H) 02/19/2020 0758   EOSABS 0.3 02/19/2020 0758   EOSABS 0.2 10/17/2018 1433   BASOSABS 0.1 02/19/2020 0758   BASOSABS 0.1 10/17/2018 1433    Recent Results (from the past 240 hour(s))  Respiratory Panel by RT PCR (Flu A&B, Covid) - Nasopharyngeal Swab     Status: None  Collection Time: 02/19/20 10:52 AM   Specimen: Nasopharyngeal Swab  Result Value Ref Range Status   SARS Coronavirus 2 by RT PCR NEGATIVE NEGATIVE Final    Comment: (NOTE) SARS-CoV-2 target nucleic acids are NOT DETECTED.  The SARS-CoV-2 RNA is generally detectable in upper respiratoy specimens during the acute phase of infection. The lowest concentration of SARS-CoV-2 viral copies this assay can detect is 131 copies/mL. A negative result does not preclude SARS-Cov-2 infection and should not be used as the sole basis for treatment or other patient management decisions. A negative  result may occur with  improper specimen collection/handling, submission of specimen other than nasopharyngeal swab, presence of viral mutation(s) within the areas targeted by this assay, and inadequate number of viral copies (<131 copies/mL). A negative result must be combined with clinical observations, patient history, and epidemiological information. The expected result is Negative.  Fact Sheet for Patients:  https://www.moore.com/  Fact Sheet for Healthcare Providers:  https://www.young.biz/  This test is no t yet approved or cleared by the [place] FDA and  has been authorized for detection and/or diagnosis of SARS-CoV-2 by FDA under an Emergency Use Authorization (EUA). This EUA will remain  in effect (meaning this test can be used) for the duration of the COVID-19 declaration under Section 564(b)(1) of the Act, 21 U.S.C. section 360bbb-3(b)(1), unless the authorization is terminated or revoked sooner.     Influenza A by PCR NEGATIVE NEGATIVE Final   Influenza B by PCR NEGATIVE NEGATIVE Final    Comment: (NOTE) The Xpert Xpress SARS-CoV-2/FLU/RSV assay is intended as an aid in  the diagnosis of influenza from Nasopharyngeal swab specimens and  should not be used as a sole basis for treatment. Nasal washings and  aspirates are unacceptable for Xpert Xpress SARS-CoV-2/FLU/RSV  testing.  Fact Sheet for Patients: https://www.moore.com/  Fact Sheet for Healthcare Providers: https://www.young.biz/  This test is not yet approved or cleared by the [place] FDA and  has been authorized for detection and/or diagnosis of SARS-CoV-2 by  FDA under an Emergency Use Authorization (EUA). This EUA will remain  in effect (meaning this test can be used) for the duration of the  Covid-19 declaration under Section 564(b)(1) of the Act, 21  U.S.C. section 360bbb-3(b)(1), unless the authorization is   terminated or revoked. Performed at [facility], [address] [place], [place] [address]   Urine culture     Status: None   Collection Time: 02/19/20  3:10 PM   Specimen: Urine, Random  Result Value Ref Range Status   Specimen Description   Final    URINE, RANDOM Performed at [facility], [address] [place], [place] [address]    Special Requests   Final    NONE Performed at [facility], [address] [place], [place] [address]    Culture   Final    NO GROWTH Performed at [facility], [address], [place], [place] [address]    Report Status 02/20/2020 FINAL  Final    Studies/Results: DG Chest 2 View  Result Date: 02/21/2020 CLINICAL DATA:  29 year old male with sickle cell disease. HIV. Pain symptoms. EXAM: CHEST - 2 VIEW COMPARISON:  Portable chest 02/19/2020 and earlier. FINDINGS: PA and lateral views now. Stable right chest power port, accessed. Cardiac size is within normal limits. Other mediastinal contours are within normal limits. Visualized tracheal air column is within normal limits. No pneumothorax or pleural effusion. No consolidation. Coarse bilateral pulmonary  interstitial opacity appears stable over this series of exams. No new pulmonary opacity. Stable visualized osseous structures. Paucity of bowel gas in the upper abdomen. IMPRESSION: Coarse pulmonary interstitial changes are chronic and stable. No acute cardiopulmonary abnormality. Electronically Signed   By: Odessa Fleming M.D.   On: 02/21/2020 14:52    Medications: Scheduled Meds: . abacavir-dolutegravir-lamiVUDine  1 tablet Oral QHS  . apixaban  5 mg Oral BID  . Chlorhexidine Gluconate Cloth  6 each Topical Daily  . HYDROmorphone   Intravenous Q4H  . hydroxyurea  2,000 mg Oral QHS  . multivitamin with minerals  1 tablet Oral Q breakfast  . senna-docusate  1 tablet Oral BID   Continuous Infusions: PRN Meds:.ALPRAZolam,  diphenhydrAMINE, lip balm, naloxone **AND** sodium chloride flush, ondansetron, ondansetron (ZOFRAN) IV, oxyCODONE-acetaminophen **AND** oxyCODONE, polyethylene glycol  Consultants:  None  Procedures:  None  Antibiotics:  None  Assessment/Plan: Principal Problem:   Acute chest syndrome (HCC) Active Problems:   Generalized anxiety disorder   HIV (human immunodeficiency virus infection) (HCC)   History of pulmonary embolus (PE)   Hypoxia   Chronic, continuous use of opioids   Leukocytosis  Sickle cell disease with acute chest syndrome: [facility].  02/21/2020, chest x-ray showed no acute cardiopulmonary process.  Also, patient does not have an oxygen requirement.  Oxygen saturation is 98% on RA. Continue weaning IV Dilaudid PCA.  Also, Percocet 10-325 mg every 4 hours as needed for severe breakthrough pain Monitor vital signs closely, reevaluate pain scale regularly, and supplemental oxygen as needed  Sickle cell anemia: Hemoglobin continues to be stable and consistent with patient's baseline.  There is no clinical indication for blood transfusion at this time.  Continue to follow closely.  Leukocytosis: Stable.  Patient continues to be afebrile.  IV antibiotics previously discontinued.  Continue to follow closely.  Repeat CBC in a.m.  Chronic pain syndrome: Continue home medications  HIV disease: Stable.  Continue home medications.  Genital itching/penile discharge: RPR positive, awaiting treponemal confirmation.  Chlamydia and gonorrhea by PCR pending.  Discussed findings with patient at length.  Patient states that he had unprotected sex with a new partner 1 week ago.  Counseled at length on the importance of using barrier protection with sexual intercourse.  History of generalized anxiety disorder: Continue Xanax as needed.  Patient denies any suicidal intentions at this time.  Continue to follow closely.  History of pulmonary embolism: Continue  Eliquis   Code Status: Full Code Family Communication: N/A Disposition Plan: Not yet ready for discharge   Luceal Hollibaugh Rennis Petty  APRN, [name], [name] [facility] [address] [place], [place] [address] [phone]  If 5PM-8AM, please contact night-coverage.  02/22/2020, 1:23 PM  LOS: 3 days

## 2020-02-22 NOTE — Plan of Care (Signed)
  Problem: Bowel/Gastric: Goal: Gut motility will be maintained Outcome: Progressing   Problem: Tissue Perfusion: Goal: Complications related to inadequate tissue perfusion will be avoided or minimized Outcome: Progressing   Problem: Respiratory: Goal: Pulmonary complications will be avoided or minimized Outcome: Progressing Goal: Acute Chest Syndrome will be identified early to prevent complications Outcome: Progressing

## 2020-02-23 ENCOUNTER — Telehealth: Payer: Self-pay | Admitting: Family Medicine

## 2020-02-23 ENCOUNTER — Other Ambulatory Visit: Payer: Self-pay | Admitting: Family Medicine

## 2020-02-23 DIAGNOSIS — R360 Urethral discharge without blood: Secondary | ICD-10-CM

## 2020-02-23 DIAGNOSIS — G8929 Other chronic pain: Secondary | ICD-10-CM

## 2020-02-23 DIAGNOSIS — D571 Sickle-cell disease without crisis: Secondary | ICD-10-CM

## 2020-02-23 LAB — T.PALLIDUM AB, TOTAL: T Pallidum Abs: NONREACTIVE

## 2020-02-23 MED ORDER — OXYCODONE-ACETAMINOPHEN 10-325 MG PO TABS: 1.0000 | ORAL_TABLET | ORAL | 0 refills | Status: DC | PRN

## 2020-02-23 MED ORDER — FLEET ENEMA 7-19 GM/118ML RE ENEM
1.0000 | ENEMA | Freq: Once | RECTAL | Status: AC
  Administered 2020-02-23: 1 via RECTAL
  Filled 2020-02-23: qty 1

## 2020-02-23 MED ORDER — HEPARIN SOD (PORK) LOCK FLUSH 100 UNIT/ML IV SOLN
500.0000 [IU] | INTRAVENOUS | Status: AC | PRN
  Administered 2020-02-23: 500 [IU]
  Filled 2020-02-23: qty 5

## 2020-02-23 MED ORDER — PENICILLIN G BENZATHINE 600000 UNIT/ML IM SUSP
2.4000 10*6.[IU] | Freq: Once | INTRAMUSCULAR | Status: DC
  Filled 2020-02-23 (×2): qty 4

## 2020-02-23 MED ORDER — SODIUM CHLORIDE 0.9% FLUSH: 10.0000 mL | INTRAVENOUS | Status: DC | PRN

## 2020-02-23 MED ORDER — PENICILLIN G BENZATHINE 1200000 UNIT/2ML IM SUSP
2.4000 10*6.[IU] | Freq: Once | INTRAMUSCULAR | Status: AC
  Administered 2020-02-23: 2.4 10*6.[IU] via INTRAMUSCULAR
  Filled 2020-02-23: qty 4

## 2020-02-23 NOTE — Discharge Summary (Signed)
Physician Discharge Summary  WOODFIN KISS KTG:[id number] DOB: [birth year] DOA: 02/19/2020  PCP: [name], [name]  Admit date: 02/19/2020  Discharge date: 02/23/2020  Discharge Diagnoses:  Principal Problem:   Acute chest syndrome (HCC) Active Problems:   Generalized anxiety disorder   HIV (human immunodeficiency virus infection) (HCC)   History of pulmonary embolus (PE)   Hypoxia   Chronic, continuous use of opioids   Leukocytosis   Positive RPR test   Itching of male genitalia   Abnormal penile discharge, without blood   Discharge Condition: Stable  Disposition:  Pt is discharged home in good condition and is to follow up with [name], [name] in 1  week to have labs evaluated. Martin [name] is instructed to increase activity slowly and balance with rest for the next few days, and use prescribed medication to complete treatment of pain  Diet: Regular Wt Readings from Last 3 Encounters:  02/19/20 68 kg  01/29/20 68 kg  01/03/20 68.4 kg    History of present illness:  Martin [name]  is a 29 y.o. male with a medical history significant for sickle cell disease, chronic pain syndrome, opiate dependence and tolerance, history of PE on Eliquis, HIV disease, generalized anxiety, and history of anemia of chronic disease presented to the emergency department with generalized pain primarily to mid chest that is consistent with his previous pain crisis.  Patient states that he awakened overnight with increased body pain and some shortness of breath.  Pain was unrelieved by home medications.  He attributes pain crisis to changes in weather.  Patient's pain intensity is 8/10 characterized as constant and throbbing.  While in the emergency department, patient is requiring 2 L supplemental oxygen to maintain oxygen saturation above 90%.  Patient denies fever, chills, headache, dizziness, urinary symptoms, nausea, vomiting, or diarrhea.  No sick contacts, recent travel, or  exposure to COVID-19.  ER course:  Vital signs show: BP 104/70   Pulse 63   Temp 97.9 F (36.6 C) (Oral)   Resp 12   Ht 6\' 3"  (1.905 m)   Wt 68 kg   SpO2 90%   BMI 18.75 kg/m . Patient's oxygen saturation is 90% on 2 liters supplemental oxygen. WBCs 13.2, patient is afebrile. Hemoglobin 8.2, which is consistent with patient's baseline.  Total bilirubin elevated at 5.7.  Chest x-ray shows suspected subtle patchy airspace disease within the mid to lower lung field bilaterally.  Stable background chronic interstitial prominence and pulmonary scarring.  IV ceftriaxone and oral azithromycin initiated in the emergency department.  COVID-19 test negative.  Pain persists despite IV Dilaudid, IV fluids, and IV Toradol.  Patient admitted to MedSurg for management of CAP versus acute chest syndrome in the setting of sickle cell pain crisis  Hospital Course:  Sickle cell pain crisis with acute chest syndrome:  Patient was admitted for sickle cell pain crisis and managed appropriately with IVF, IV Dilaudid via PCA as well as other adjunct therapies per sickle cell pain management protocols.  On admission, patient chest x-ray shows suspected subtle patchy airspace disease with then the mid to lower lung fields bilaterally.  Also, at that time patient had oxygen saturation ranging from 87-90% and required 2 L supplemental oxygen.  IV antibiotics were initiated at that time. Repeat chest x-ray showed coarse pulmonary interstitial changes that were chronic and stable with no acute cardiopulmonary abnormalities.  IV and oral antibiotics were discontinued.  No further antibiotic treatment warranted at discharge.  IV Dilaudid  PCA was weaned appropriately.  Patient's pain intensity is 5/10.  He says that he can manage at home on current medication regiment and is requesting discharge.  History of pulmonary embolism: Eliquis was continued throughout admission without interruption.  Discussed the importance of taking  this medication consistently.  HIV disease: Levels undetectable.  Takes antivirals consistently.  Advised to follow-up with infectious disease as scheduled.  Positive RPR: Treponemal test pending for confirmation.  Patient continues to have clear penile discharge, 2,400,000 units IM of benzathine, penicillin G administered prior to discharge.  Will continue to follow treponemal confirmation and for results to patient's PCP.  Discussed the importance of barrier protection with sexual intercourse at length.  Patient expressed understanding.   Patient was therefore discharged home today in a hemodynamically stable condition.   Martin [name] will follow-up with PCP within 1 week of this discharge. Martin [name] was counseled extensively about nonpharmacologic means of pain management, patient verbalized understanding and was appreciative of  the care received during this admission.   We discussed the need for good hydration, monitoring of hydration status, avoidance of heat, cold, stress, and infection triggers. We discussed the need to be adherent with taking Hydrea and other home medications. Patient was reminded of the need to seek medical attention immediately if any symptom of bleeding, anemia, or infection occurs.  Discharge Exam: Vitals:   02/23/20 1010 02/23/20 1208  BP: 115/72   Pulse: 84   Resp: 10 15  Temp: 98.1 F (36.7 C)   SpO2: 94% 97%   Vitals:   02/23/20 0402 02/23/20 0734 02/23/20 1010 02/23/20 1208  BP: 116/65  115/72   Pulse: 78  84   Resp: Temp: 97.7 F (36.5 C)  98.1 F (36.7 C)   TempSrc: Oral  Oral   SpO2: 95% 97% 94% 97%  Weight:      Height:        General appearance : Awake, alert, not in any distress. Speech Clear. Not toxic looking HEENT: Atraumatic and Normocephalic, pupils equally reactive to light and accomodation Neck: Supple, no JVD. No cervical lymphadenopathy.  Chest: Good air entry bilaterally, no added sounds  CVS: S1 S2 regular, no  murmurs.  Abdomen: Bowel sounds present, Non tender and not distended with no gaurding, rigidity or rebound. Extremities: B/L Lower Ext shows no edema, both legs are warm to touch Neurology: Awake alert, and oriented X 3, CN II-XII intact, Non focal Skin: No Rash  Discharge Instructions  Discharge Instructions    Discharge patient   Complete by: As directed    Discharge disposition: 01-Home or Self Care   Discharge patient date: 02/23/2020     Allergies as of 02/23/2020      Reactions   Tape Rash, Other (See Comments)   PLEASE DO NOT USE THE CLEAR, THICK, "PLASTIC" TAPE- only paper tape is tolerated    Ketorolac Swelling, Other (See Comments)   Patient reports facial edema and left arm edema after administration.      Medication List    TAKE these medications   ALPRAZolam 1 MG tablet Commonly known as: XANAX Take 1 tablet by mouth 3 times daily as needed for anxiety or sleep.   apixaban 5 MG Tabs tablet Commonly known as: Eliquis Take 1 tablet (5 mg total) by mouth 2 (two) times daily.   cetirizine 10 MG tablet Commonly known as: ZYRTEC Take 1 tablet (10 mg total) by mouth daily. What changed:   when to take this  reasons to take this   guaiFENesin-dextromethorphan 100-10 MG/5ML syrup Commonly known as: ROBITUSSIN DM Take 5 mLs by mouth every 4 (four) hours as needed for cough.   hydroxyurea 500 MG capsule Commonly known as: HYDREA Take 4 capsules (2,000 mg total) by mouth at bedtime.   multivitamin with minerals Tabs tablet Take 1 tablet by mouth daily with breakfast.   naloxone 0.4 MG/ML injection Commonly known as: NARCAN As needed. What changed:   how much to take  how to take this  when to take this  reasons to take this  additional instructions   oxyCODONE-acetaminophen 10-325 MG tablet Commonly known as: PERCOCET Take 1 tablet by mouth every 4 (four) hours as needed for up to 15 days for pain. Start taking on: February 26, 2020 What  changed: These instructions start on February 26, 2020. If you are unsure what to do until then, ask your doctor or other care provider.   Triumeq 600-50-300 MG tablet Generic drug: abacavir-dolutegravir-lamiVUDine Take 1 tablet by mouth at bedtime.   Vitamin D (Ergocalciferol) 1.25 MG (50000 UNIT) Caps capsule Commonly known as: DRISDOL Take 1 capsule (50,000 Units total) by mouth every 7 (seven) days.       The results of significant diagnostics from this hospitalization (including imaging, microbiology, ancillary and laboratory) are listed below for reference.    Significant Diagnostic Studies: DG Chest 2 View  Result Date: 02/21/2020 CLINICAL DATA:  29 year old male with sickle cell disease. HIV. Pain symptoms. EXAM: CHEST - 2 VIEW COMPARISON:  Portable chest 02/19/2020 and earlier. FINDINGS: PA and lateral views now. Stable right chest power port, accessed. Cardiac size is within normal limits. Other mediastinal contours are within normal limits. Visualized tracheal air column is within normal limits. No pneumothorax or pleural effusion. No consolidation. Coarse bilateral pulmonary interstitial opacity appears stable over this series of exams. No new pulmonary opacity. Stable visualized osseous structures. Paucity of bowel gas in the upper abdomen. IMPRESSION: Coarse pulmonary interstitial changes are chronic and stable. No acute cardiopulmonary abnormality. Electronically Signed   By: Odessa Fleming M.D.   On: 02/21/2020 14:52   DG Chest Port 1 View  Result Date: 02/19/2020 CLINICAL DATA:  Hypoxia. Additional history provided: Chest pain due to sickle cell pain crisis EXAM: PORTABLE CHEST 1 VIEW COMPARISON:  Prior chest radiographs 01/29/2020 and earlier. FINDINGS: Unchanged position of a right chest infusion port catheter. Heart size at the upper limits of normal. Suspected subtle patchy airspace disease within the mid to lower lung fields bilaterally. Unchanged background chronic prominence of  the interstitial lung markings/pulmonary scarring. No evidence of pleural effusion or pneumothorax. No acute bony abnormality identified. IMPRESSION: Suspected subtle patchy airspace disease within the mid to lower lung fields bilaterally. Stable background chronic interstitial prominence/pulmonary scarring. Electronically Signed   By: Jackey Loge DO   On: 02/19/2020 08:05   DG Chest Port 1 View  Result Date: 01/29/2020 CLINICAL DATA:  Shortness of breath.  Sickle cell pain. EXAM: PORTABLE CHEST 1 VIEW COMPARISON:  01/02/2020 FINDINGS: 0703 hours. Cardiopericardial silhouette is at upper limits of normal for size. Interstitial markings are diffusely coarsened with chronic features. No focal airspace consolidation. No pulmonary edema or pleural effusion. Right Port-A-Cath again noted. The visualized bony structures of the thorax show no acute abnormality. IMPRESSION: No active disease. Electronically Signed   By: Kennith Center M.D.   On: 01/29/2020 07:46    Microbiology: Recent Results (from the past 240 hour(s))  Respiratory Panel by RT PCR (Flu  A&B, Covid) - Nasopharyngeal Swab     Status: None   Collection Time: 02/19/20 10:52 AM   Specimen: Nasopharyngeal Swab  Result Value Ref Range Status   SARS Coronavirus 2 by RT PCR NEGATIVE NEGATIVE Final    Comment: (NOTE) SARS-CoV-2 target nucleic acids are NOT DETECTED.  The SARS-CoV-2 RNA is generally detectable in upper respiratoy specimens during the acute phase of infection. The lowest concentration of SARS-CoV-2 viral copies this assay can detect is 131 copies/mL. A negative result does not preclude SARS-Cov-2 infection and should not be used as the sole basis for treatment or other patient management decisions. A negative result may occur with  improper specimen collection/handling, submission of specimen other than nasopharyngeal swab, presence of viral mutation(s) within the areas targeted by this assay, and inadequate number of viral  copies (<131 copies/mL). A negative result must be combined with clinical observations, patient history, and epidemiological information. The expected result is Negative.  Fact Sheet for Patients:  https://www.moore.com/https://www.fda.gov/media/142436/download  Fact Sheet for Healthcare Providers:  https://www.young.biz/https://www.fda.gov/media/142435/download  This test is no t yet approved or cleared by the [place] FDA and  has been authorized for detection and/or diagnosis of SARS-CoV-2 by FDA under an Emergency Use Authorization (EUA). This EUA will remain  in effect (meaning this test can be used) for the duration of the COVID-19 declaration under Section 564(b)(1) of the Act, 21 U.S.C. section 360bbb-3(b)(1), unless the authorization is terminated or revoked sooner.     Influenza A by PCR NEGATIVE NEGATIVE Final   Influenza B by PCR NEGATIVE NEGATIVE Final    Comment: (NOTE) The Xpert Xpress SARS-CoV-2/FLU/RSV assay is intended as an aid in  the diagnosis of influenza from Nasopharyngeal swab specimens and  should not be used as a sole basis for treatment. Nasal washings and  aspirates are unacceptable for Xpert Xpress SARS-CoV-2/FLU/RSV  testing.  Fact Sheet for Patients: https://www.moore.com/https://www.fda.gov/media/142436/download  Fact Sheet for Healthcare Providers: https://www.young.biz/https://www.fda.gov/media/142435/download  This test is not yet approved or cleared by the [place] FDA and  has been authorized for detection and/or diagnosis of SARS-CoV-2 by  FDA under an Emergency Use Authorization (EUA). This EUA will remain  in effect (meaning this test can be used) for the duration of the  Covid-19 declaration under Section 564(b)(1) of the Act, 21  U.S.C. section 360bbb-3(b)(1), unless the authorization is  terminated or revoked. Performed at [facility], [address] [place], [place] [address]   Urine culture     Status: None   Collection Time: 02/19/20  3:10 PM   Specimen: Urine, Random  Result  Value Ref Range Status   Specimen Description   Final    URINE, RANDOM Performed at [facility], [address] [place], [place] [address]    Special Requests   Final    NONE Performed at [facility], [address] [place], [place] [address]    Culture   Final    NO GROWTH Performed at [facility], [address] [place], [place] [address]    Report Status 02/20/2020 FINAL  Final     Labs: Basic Metabolic Panel: Recent Labs  Lab 02/19/20 0758 02/20/20 0605  NA 140 137  K 3.6 4.0  CL 109 107  CO2 25 25  GLUCOSE 98 95  BUN 10 8  CREATININE 0.75 0.73  CALCIUM 8.5* 8.4*   Liver Function Tests: Recent Labs  Lab 02/19/20 0758  AST 34  ALT 19  ALKPHOS 49  BILITOT 5.7*  PROT 8.2*  ALBUMIN 3.6   No results for input(s): LIPASE, AMYLASE in the last 168 hours. No results for input(s): AMMONIA in the last 168 hours. CBC: Recent Labs  Lab 02/19/20 0758 02/20/20 0605 02/21/20 0445  WBC 13.2* 13.1* 11.2*  NEUTROABS 4.8  --   --   HGB 8.2* 7.9* 8.1*  HCT 22.2* 21.5* 22.2*  MCV 101.8* 100.5* 100.5*  PLT 374 345 366   Cardiac Enzymes: No results for input(s): CKTOTAL, CKMB, CKMBINDEX, TROPONINI in the last 168 hours. BNP: Invalid input(s): POCBNP CBG: No results for input(s): GLUCAP in the last 168 hours.  Time coordinating discharge: 35 minutes  Signed:  [name]  APRN, [name], [name] Patient Care [facility] [address] [place], [place] [address] [phone]   [facility] 02/23/2020, 3:57 PM

## 2020-02-26 ENCOUNTER — Telehealth: Payer: Self-pay | Admitting: Family Medicine

## 2020-02-26 NOTE — Telephone Encounter (Signed)
Sent to provider 

## 2020-02-26 NOTE — Telephone Encounter (Signed)
Martin [name] was admitted to inpatient services on 02/19/2020. During hospitalization, patient complained of penile discharge and genital itching.  At that time, RPR was positive.  Patient was discharge prior to confirmation.  Confirmation test was nonreactive.  No further treatment warranted.  Attempted to notify patient.  Voicemail not set up.  Will attempt to call back at a later time.   [name]  APRN, [name], [name] Patient Care [facility] [address] [place], [place] [address] [phone]

## 2020-02-27 ENCOUNTER — Ambulatory Visit (INDEPENDENT_AMBULATORY_CARE_PROVIDER_SITE_OTHER): Payer: Medicaid Other | Admitting: Family Medicine

## 2020-02-27 ENCOUNTER — Ambulatory Visit (HOSPITAL_COMMUNITY)
Admission: RE | Admit: 2020-02-27 | Discharge: 2020-02-27 | Disposition: A | Discharge status: Home / Self Care | Payer: Medicaid Other | Source: Ambulatory Visit | Attending: Internal Medicine | Admitting: Internal Medicine

## 2020-02-27 ENCOUNTER — Encounter: Payer: Self-pay | Admitting: Family Medicine

## 2020-02-27 ENCOUNTER — Other Ambulatory Visit: Payer: Self-pay

## 2020-02-27 VITALS — BP 101/67 | HR 85 | Temp 99.0°F | Resp 17 | Ht 75.0 in | Wt 147.4 lb

## 2020-02-27 DIAGNOSIS — F1190 Opioid use, unspecified, uncomplicated: Secondary | ICD-10-CM | POA: Diagnosis not present

## 2020-02-27 DIAGNOSIS — D571 Sickle-cell disease without crisis: Secondary | ICD-10-CM | POA: Diagnosis not present

## 2020-02-27 DIAGNOSIS — Z09 Encounter for follow-up examination after completed treatment for conditions other than malignant neoplasm: Secondary | ICD-10-CM

## 2020-02-27 DIAGNOSIS — G8929 Other chronic pain: Secondary | ICD-10-CM | POA: Diagnosis not present

## 2020-02-27 DIAGNOSIS — Z21 Asymptomatic human immunodeficiency virus [HIV] infection status: Secondary | ICD-10-CM

## 2020-02-27 DIAGNOSIS — E559 Vitamin D deficiency, unspecified: Secondary | ICD-10-CM

## 2020-02-27 DIAGNOSIS — F419 Anxiety disorder, unspecified: Secondary | ICD-10-CM

## 2020-02-27 LAB — CBC WITH DIFFERENTIAL/PLATELET
Abs Immature Granulocytes: 0.06 10*3/uL (ref 0.00–0.07)
Basophils Absolute: 0.1 10*3/uL (ref 0.0–0.1)
Basophils Relative: 1 %
Eosinophils Absolute: 0.1 10*3/uL (ref 0.0–0.5)
Eosinophils Relative: 1 %
HCT: 2.8 % — ABNORMAL LOW (ref 39.0–52.0)
Hemoglobin: 7.1 g/dL — ABNORMAL LOW (ref 13.0–17.0)
Immature Granulocytes: 1 %
Lymphocytes Relative: 67 %
Lymphs Abs: 7.5 10*3/uL — ABNORMAL HIGH (ref 0.7–4.0)
MCH: 253.6 pg — ABNORMAL HIGH (ref 26.0–34.0)
MCHC: 253.6 g/dL — ABNORMAL HIGH (ref 30.0–36.0)
MCV: 100 fL (ref 80.0–100.0)
Monocytes Absolute: 0.8 10*3/uL (ref 0.1–1.0)
Monocytes Relative: 7 %
Neutro Abs: 2.5 10*3/uL (ref 1.7–7.7)
Neutrophils Relative %: 23 %
Platelets: 339 10*3/uL (ref 150–400)
RBC: 0.28 MIL/uL — ABNORMAL LOW (ref 4.22–5.81)
RDW: 30 % — ABNORMAL HIGH (ref 11.5–15.5)
WBC: 11 10*3/uL — ABNORMAL HIGH (ref 4.0–10.5)
nRBC: 13.1 % — ABNORMAL HIGH (ref 0.0–0.2)

## 2020-02-27 MED ORDER — HEPARIN SOD (PORK) LOCK FLUSH 100 UNIT/ML IV SOLN
500.0000 [IU] | INTRAVENOUS | Status: AC | PRN
  Administered 2020-02-27: 500 [IU]

## 2020-02-27 MED ORDER — SODIUM CHLORIDE 0.9% FLUSH
10.0000 mL | INTRAVENOUS | Status: AC | PRN
  Administered 2020-02-27: 10 mL

## 2020-02-27 NOTE — Progress Notes (Signed)
[facility]   Hospital Follow Up  Subjective:  Patient ID: TAUREAN JU, male    DOB: [birth year]  Age: 29 y.o. MRN: [id number]  CC: No chief complaint on file.   HPI Martin [name] is a 29 year old male who presents for Hospital Follow Up today.    Patient Active Problem List   Diagnosis Date Noted  . 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 Itching of male genitalia 02/22/2020  . Abnormal penile discharge, without blood 02/22/2020  . Acute chest syndrome (HCC) 02/19/2020  . Leukocytosis 01/02/2020  . Anxiety 11/27/2019  . Proteinuria 11/27/2019  . Scleral icterus   . Chronic, continuous use of opioids 08/15/2019  . Seasonal allergies 08/15/2019  . Hypoxia   . Chest congestion   . Sickle cell anemia with crisis (HCC) 08/04/2019  . Other chronic pain 08/04/2019  . History of pulmonary embolus (PE) 08/04/2019  . Acute pulmonary embolism (HCC) 02/10/2019  . Acute chest syndrome due to sickle cell crisis (HCC) 02/10/2019  . Sickle cell crisis (HCC) 01/17/2019  . Heart murmur 02/03/2017  . Sickle cell disease with crisis (HCC) 02/02/2017  . 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 Sickle cell pain crisis (HCC) 02/02/2017  . 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HCC) 12/20/2016  . Vitamin D deficiency 08/14/2016  . High risk medication use 09/27/2014  . Generalized anxiety disorder 05/19/2014  . GERD (gastroesophageal reflux disease) 05/19/2014  . HIV (human immunodeficiency virus infection) (HCC) 05/23/2012   Current Status: Since his last office visit, he is doing well with no complaints. He states that he has pain in his lower back. He rates his pain today at 6-7/10. He has not had a hospital visit for Sickle Cell Crisis since 02/19/2020 where he was treated and discharged on 02/23/2020. He is currently taking all medications as prescribed and staying well hydrated. He reports occasional nausea, constipation, dizziness and headaches. His  anxiety is stable today. He denies fevers, chills, fatigue, recent infections, weight loss, and night sweats. He has not had any visual changes, and falls. No chest pain, heart palpitations, cough and shortness of breath reported. Denies GI problems such as vomiting, and diarrhea. He has no reports of blood in stools, dysuria and hematuria. He is taking all medications as prescribed.   Past Medical History:  Diagnosis Date  . Anxiety   . HIV (human immunodeficiency virus infection) (HCC)   . Proteinuria   . Sickle cell crisis (HCC)   . 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HCC)   . Vitamin D deficiency 10/2018    Past Surgical History:  Procedure Laterality Date  . IR IMAGING GUIDED PORT INSERTION  08/29/2019    Family History  Problem Relation Age of Onset  . Sickle cell trait Mother   . Sickle cell trait Father   . Birth defects Maternal Grandmother   . Birth defects Paternal Grandmother     Social History   Socioeconomic History  . Marital status: Single    Spouse name: Not on file  . Number of children: Not on file  . Years of education: Not on file  . Highest education level: Not on file  Occupational History  . Not on file  Tobacco Use  . Smoking status: Never Smoker  . Smokeless tobacco: Never Used  Vaping Use  . Vaping Use: Some days  . Substances: THC  Substance and Sexual Activity  . Alcohol use: No  . Drug use: Yes    Types: Marijuana  .  Sexual activity: Yes    Birth control/protection: Condom  Other Topics Concern  . Not on file  Social History Narrative  . Not on file   Social Determinants of Health   Financial Resource Strain:   . Difficulty of Paying Living Expenses: Not on file  Food Insecurity:   . Worried About Programme researcher, broadcasting/film/video in the Last Year: Not on file  . Ran Out of Food in the Last Year: Not on file  Transportation Needs:   . Lack of Transportation (Medical): Not on file  . Lack of Transportation (Non-Medical): Not on file  Physical Activity:     . Days of Exercise per Week: Not on file  . Minutes of Exercise per Session: Not on file  Stress:   . Feeling of Stress : Not on file  Social Connections:   . Frequency of Communication with Friends and Family: Not on file  . Frequency of Social Gatherings with Friends and Family: Not on file  . Attends Religious Services: Not on file  . Active Member of Clubs or Organizations: Not on file  . Attends Banker Meetings: Not on file  . Marital Status: Not on file  Intimate Partner Violence:   . Fear of Current or Ex-Partner: Not on file  . Emotionally Abused: Not on file  . Physically Abused: Not on file  . Sexually Abused: Not on file    Outpatient Medications Prior to Visit  Medication Sig Dispense Refill  . abacavir-dolutegravir-lamiVUDine (TRIUMEQ) 600-50-300 MG tablet Take 1 tablet by mouth at bedtime.     . ALPRAZolam (XANAX) 1 MG tablet Take 1 tablet by mouth 3 times daily as needed for anxiety or sleep. 30 tablet 0  . apixaban (ELIQUIS) 5 MG TABS tablet Take 1 tablet (5 mg total) by mouth 2 (two) times daily. 60 tablet 5  . cetirizine (ZYRTEC) 10 MG tablet Take 1 tablet (10 mg total) by mouth daily. (Patient taking differently: Take 10 mg by mouth daily as needed for allergies. ) 30 tablet 11  . guaiFENesin-dextromethorphan (ROBITUSSIN DM) 100-10 MG/5ML syrup Take 5 mLs by mouth every 4 (four) hours as needed for cough. 118 mL 0  . hydroxyurea (HYDREA) 500 MG capsule Take 4 capsules (2,000 mg total) by mouth at bedtime. 120 capsule 11  . Multiple Vitamin (MULTIVITAMIN WITH MINERALS) TABS tablet Take 1 tablet by mouth daily with breakfast.     . naloxone (NARCAN) 0.4 MG/ML injection As needed. (Patient taking differently: Place 0.4 mg into the nose as needed (overdose). ) 1 mL 2  . oxyCODONE-acetaminophen (PERCOCET) 10-325 MG tablet Take 1 tablet by mouth every 4 (four) hours as needed for up to 15 days for pain. 90 tablet 0  . Vitamin D, Ergocalciferol, (DRISDOL) 1.25  MG (50000 UT) CAPS capsule Take 1 capsule (50,000 Units total) by mouth every 7 (seven) days. (Patient not taking: Reported on 02/19/2020) 5 capsule 3   No facility-administered medications prior to visit.    Allergies  Allergen Reactions  . Tape Rash and Other (See Comments)    PLEASE DO NOT USE THE CLEAR, THICK, "PLASTIC" TAPE- only paper tape is tolerated   . Ketorolac Swelling and Other (See Comments)    Patient reports facial edema and left arm edema after administration.    ROS Review of Systems  Constitutional: Negative.   HENT: Negative.   Eyes: Negative.   Respiratory: Negative.   Cardiovascular: Negative.   Gastrointestinal: Negative.   Endocrine: Negative.  Genitourinary: Negative.   Musculoskeletal: Positive for arthralgias (generalized joint pain r/t Sickle Cell Anemia Disease).  Skin: Negative.   Allergic/Immunologic: Negative.   Neurological: Positive for dizziness (occasional ) and headaches (occasional ).  Hematological: Negative.   Psychiatric/Behavioral: Negative.    Objective:    Physical Exam Vitals and nursing note reviewed.  Constitutional:      Appearance: Normal appearance.  HENT:     Head: Normocephalic and atraumatic.     Nose: Nose normal.     Mouth/Throat:     Mouth: Mucous membranes are moist.     Pharynx: Oropharynx is clear.  Cardiovascular:     Rate and Rhythm: Normal rate and regular rhythm.     Pulses: Normal pulses.     Heart sounds: Normal heart sounds.  Pulmonary:     Effort: Pulmonary effort is normal.     Breath sounds: Normal breath sounds.  Abdominal:     General: Abdomen is flat. Bowel sounds are normal.     Palpations: Abdomen is soft.  Musculoskeletal:        General: Normal range of motion.     Cervical back: Normal range of motion and neck supple.  Skin:    General: Skin is warm and dry.  Neurological:     General: No focal deficit present.     Mental Status: He is alert and oriented to person, place, and time.    Psychiatric:        Mood and Affect: Mood normal.        Behavior: Behavior normal.        Thought Content: Thought content normal.        Judgment: Judgment normal.     There were no vitals taken for this visit. Wt Readings from Last 3 Encounters:  02/19/20 150 lb (68 kg)  01/29/20 150 lb (68 kg)  01/03/20 150 lb 12.7 oz (68.4 kg)     Health Maintenance Due  Topic Date Due  . COVID-19 Vaccine (2 - Moderna 2-dose series) 11/14/2019  . INFLUENZA VACCINE  11/19/2019    There are no preventive care reminders to display for this patient.  No results found for: TSH Lab Results  Component Value Date   WBC 11.2 (H) 02/21/2020   HGB 8.1 (L) 02/21/2020   HCT 22.2 (L) 02/21/2020   MCV 100.5 (H) 02/21/2020   PLT 366 02/21/2020   Lab Results  Component Value Date   NA 137 02/20/2020   K 4.0 02/20/2020   CO2 25 02/20/2020   GLUCOSE 95 02/20/2020   BUN 8 02/20/2020   CREATININE 0.73 02/20/2020   BILITOT 5.7 (H) 02/19/2020   ALKPHOS 49 02/19/2020   AST 34 02/19/2020   ALT 19 02/19/2020   PROT 8.2 (H) 02/19/2020   ALBUMIN 3.6 02/19/2020   CALCIUM 8.4 (L) 02/20/2020   ANIONGAP 5 02/20/2020   No results found for: CHOL No results found for: HDL No results found for: LDLCALC No results found for: [name] No results found for: CHOLHDL Lab Results  Component Value Date   HGBA1C (L) 05/22/2009    <4.0 (NOTE) The ADA recommends the following therapeutic goal for glycemic control related to Hgb A1c measurement: Goal of therapy: <6.5 Hgb A1c  Reference: American Diabetes Association: Clinical Practice Recommendations 2010, Diabetes Care, 2010, 33: (Suppl  1).   Assessment & Plan:   1. Hospital discharge follow-up  2. Hb-SS disease without crisis [facility]) He is doing well today r/t his chronic pain management. He  will continue to take pain medications as prescribed; will continue to avoid extreme heat and cold; will continue to eat a healthy diet and drink at least 64 ounces of  water daily; continue stool softener as needed; will avoid colds and flu; will continue to get plenty of sleep and rest; will continue to avoid high stressful situations and remain infection free; will continue Folic Acid 1 mg daily to avoid sickle cell crisis. Continue to follow up with Hematologist as needed.   3. Other chronic pain  4. Chronic, continuous use of opioids  5. Asymptomatic HIV infection (HCC) Continue anti-viral medications and to follow up with Infection Disease as needed.   6. Anxiety  7. Follow up He will follow up in 2 months.   No orders of the defined types were placed in this encounter.   No orders of the defined types were placed in this encounter.   Referral Orders  No referral(s) requested today    Raliegh IpNatalie Lesleyanne Politte,  [name], [name]-BC [facility] [address]  [place], [place] [id number] [phone] fax  No orders of the defined types were placed in this encounter.   Problem List Items Addressed This Visit      Other   Anxiety   Chronic, continuous use of opioids   Other chronic pain    Other Visit Diagnoses    Hospital discharge follow-up    -  Primary   Hb-SS disease without crisis (HCC)       Asymptomatic HIV infection (HCC)       Follow up          No orders of the defined types were placed in this encounter.   Follow-up: No follow-ups on file.    Kallie LocksNatalie M Jordell Outten, [name]

## 2020-02-27 NOTE — Progress Notes (Signed)
Patient came in for CBC with diff lab draw as ordered by [name] [name]. Implanted port accessed and de-accessed per protocol using 500 units of heparin and 10 cc normal saline flush. Tolerated well, alert, oriented  and ambulatory at discharge.

## 2020-03-11 ENCOUNTER — Other Ambulatory Visit: Payer: Self-pay | Admitting: Family Medicine

## 2020-03-11 ENCOUNTER — Telehealth: Payer: Self-pay | Admitting: Family Medicine

## 2020-03-11 DIAGNOSIS — D571 Sickle-cell disease without crisis: Secondary | ICD-10-CM

## 2020-03-11 DIAGNOSIS — G8929 Other chronic pain: Secondary | ICD-10-CM

## 2020-03-11 MED ORDER — OXYCODONE-ACETAMINOPHEN 10-325 MG PO TABS: 1.0000 | ORAL_TABLET | ORAL | 0 refills | Status: DC | PRN

## 2020-03-12 NOTE — Telephone Encounter (Signed)
Sent to provider 

## 2020-03-17 ENCOUNTER — Emergency Department (HOSPITAL_COMMUNITY)
Admission: EM | Admit: 2020-03-17 | Discharge: 2020-03-17 | Disposition: A | Discharge status: Home / Self Care | Payer: Medicaid Other | Attending: Emergency Medicine | Admitting: Emergency Medicine

## 2020-03-17 ENCOUNTER — Emergency Department (HOSPITAL_COMMUNITY): Payer: Medicaid Other

## 2020-03-17 ENCOUNTER — Other Ambulatory Visit: Payer: Self-pay

## 2020-03-17 ENCOUNTER — Encounter (HOSPITAL_COMMUNITY): Payer: Self-pay | Admitting: Emergency Medicine

## 2020-03-17 DIAGNOSIS — D57219 Sickle-cell/Hb-C disease with crisis, unspecified: Secondary | ICD-10-CM | POA: Insufficient documentation

## 2020-03-17 DIAGNOSIS — Z21 Asymptomatic human immunodeficiency virus [HIV] infection status: Secondary | ICD-10-CM | POA: Diagnosis not present

## 2020-03-17 DIAGNOSIS — Z79899 Other long term (current) drug therapy: Secondary | ICD-10-CM | POA: Diagnosis not present

## 2020-03-17 DIAGNOSIS — D5700 Hb-SS disease with crisis, unspecified: Secondary | ICD-10-CM

## 2020-03-17 LAB — CBC WITH DIFFERENTIAL/PLATELET
Abs Immature Granulocytes: 0.06 10*3/uL (ref 0.00–0.07)
Basophils Absolute: 0.1 10*3/uL (ref 0.0–0.1)
Basophils Relative: 1 %
Eosinophils Absolute: 0.1 10*3/uL (ref 0.0–0.5)
Eosinophils Relative: 1 %
HCT: 20.5 % — ABNORMAL LOW (ref 39.0–52.0)
Hemoglobin: 7.4 g/dL — ABNORMAL LOW (ref 13.0–17.0)
Immature Granulocytes: 1 %
Lymphocytes Relative: 52 %
Lymphs Abs: 5.6 10*3/uL — ABNORMAL HIGH (ref 0.7–4.0)
MCH: 35.9 pg — ABNORMAL HIGH (ref 26.0–34.0)
MCHC: 36.1 g/dL — ABNORMAL HIGH (ref 30.0–36.0)
MCV: 99.5 fL (ref 80.0–100.0)
Monocytes Absolute: 0.9 10*3/uL (ref 0.1–1.0)
Monocytes Relative: 8 %
Neutro Abs: 3.8 10*3/uL (ref 1.7–7.7)
Neutrophils Relative %: 37 %
Platelets: 408 10*3/uL — ABNORMAL HIGH (ref 150–400)
RBC: 2.06 MIL/uL — ABNORMAL LOW (ref 4.22–5.81)
RDW: 28.2 % — ABNORMAL HIGH (ref 11.5–15.5)
WBC: 10.4 10*3/uL (ref 4.0–10.5)
nRBC: 29.6 % — ABNORMAL HIGH (ref 0.0–0.2)

## 2020-03-17 LAB — COMPREHENSIVE METABOLIC PANEL
ALT: 20 U/L (ref 0–44)
AST: 33 U/L (ref 15–41)
Albumin: 3.8 g/dL (ref 3.5–5.0)
Alkaline Phosphatase: 49 U/L (ref 38–126)
Anion gap: 7 (ref 5–15)
BUN: 10 mg/dL (ref 6–20)
CO2: 23 mmol/L (ref 22–32)
Calcium: 8.8 mg/dL — ABNORMAL LOW (ref 8.9–10.3)
Chloride: 110 mmol/L (ref 98–111)
Creatinine, Ser: 0.77 mg/dL (ref 0.61–1.24)
GFR, Estimated: >60 mL/min (ref >60)
Glucose, Bld: 97 mg/dL (ref 70–99)
Potassium: 3.5 mmol/L (ref 3.5–5.1)
Sodium: 140 mmol/L (ref 135–145)
Total Bilirubin: 5.8 mg/dL — ABNORMAL HIGH (ref 0.3–1.2)
Total Protein: 8.5 g/dL — ABNORMAL HIGH (ref 6.5–8.1)

## 2020-03-17 LAB — RETICULOCYTES
Immature Retic Fract: 38.1 % — ABNORMAL HIGH (ref 2.3–15.9)
RBC.: 2.12 MIL/uL — ABNORMAL LOW (ref 4.22–5.81)
Retic Count, Absolute: 327.6 10*3/uL — ABNORMAL HIGH (ref 19.0–186.0)
Retic Ct Pct: 15.5 % — ABNORMAL HIGH (ref 0.4–3.1)

## 2020-03-17 LAB — TYPE AND SCREEN
ABO/RH(D): O POS
Antibody Screen: NEGATIVE

## 2020-03-17 MED ORDER — HYDROMORPHONE HCL 2 MG/ML IJ SOLN
2.0000 mg | Freq: Once | INTRAMUSCULAR | Status: AC
  Administered 2020-03-17: 2 mg via INTRAVENOUS
  Filled 2020-03-17: qty 1

## 2020-03-17 MED ORDER — HYDROMORPHONE HCL 2 MG/ML IJ SOLN
2.0000 mg | INTRAMUSCULAR | Status: AC
  Administered 2020-03-17: 2 mg via INTRAVENOUS
  Filled 2020-03-17: qty 1

## 2020-03-17 MED ORDER — SODIUM CHLORIDE 0.45 % IV SOLN: INTRAVENOUS | Status: DC

## 2020-03-17 MED ORDER — ONDANSETRON HCL 4 MG/2ML IJ SOLN
4.0000 mg | INTRAMUSCULAR | Status: DC | PRN
  Administered 2020-03-17: 4 mg via INTRAVENOUS
  Filled 2020-03-17: qty 2

## 2020-03-17 MED ORDER — DIPHENHYDRAMINE HCL 25 MG PO CAPS
25.0000 mg | ORAL_CAPSULE | ORAL | Status: DC | PRN
  Administered 2020-03-17: 25 mg via ORAL
  Filled 2020-03-17: qty 1

## 2020-03-17 NOTE — ED Triage Notes (Signed)
Patient presents with SCC. Patient states generalized pain all over. No cough, shortness of breath, N/V. Last dose pain medication (Percocet) at 0000.

## 2020-03-17 NOTE — ED Notes (Signed)
Patient in the middle of emotional phone call and unable to be discharged at this time. Will check back in 10.

## 2020-03-17 NOTE — ED Provider Notes (Signed)
TIME SEEN: 2:37 AM  CHIEF COMPLAINT: Sickle cell crisis  HPI: Patient is a 29 year old male with history of sickle cell disease, HIV who presents to the emergency department with complaints of pain all over that started today consistent with his previous sickle cell pain.  Denies fever, cough, chest pain, shortness of breath, nausea, vomiting, diarrhea.  Has been taking Percocet at home without relief.  Patient is hypoxic here.  Recently admitted for acute chest syndrome.  He states that he has to wear oxygen at home at night.  ROS: See HPI Constitutional: no fever  Eyes: no drainage  ENT: no runny nose   Cardiovascular:  no chest pain  Resp: no SOB  GI: no vomiting GU: no dysuria Integumentary: no rash  Allergy: no hives  Musculoskeletal: no leg swelling  Neurological: no slurred speech ROS otherwise negative  PAST MEDICAL HISTORY/PAST SURGICAL HISTORY:  Past Medical History:  Diagnosis Date  . Anxiety   . HIV (human immunodeficiency virus infection) (HCC)   . Proteinuria   . Sickle cell crisis (HCC)   . 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HCC)   . Vitamin D deficiency 10/2018    MEDICATIONS:  Prior to Admission medications   Medication Sig Start Date End Date Taking? Authorizing Provider  abacavir-dolutegravir-lamiVUDine (TRIUMEQ) 600-50-300 MG tablet Take 1 tablet by mouth at bedtime.     [provider]  apixaban (ELIQUIS) 5 MG TABS tablet Take 1 tablet (5 mg total) by mouth 2 (two) times daily. 08/21/19   Massie Maroon, [name]  cetirizine (ZYRTEC) 10 MG tablet Take 1 tablet (10 mg total) by mouth daily. Patient taking differently: Take 10 mg by mouth daily as needed for allergies.  08/04/19   [name], [name]  hydroxyurea (HYDREA) 500 MG capsule Take 4 capsules (2,000 mg total) by mouth at bedtime. 12/08/19   [name], [name]  Multiple Vitamin (MULTIVITAMIN WITH MINERALS) TABS tablet Take 1 tablet by mouth daily with breakfast.     [provider]  naloxone  (NARCAN) 0.4 MG/ML injection As needed. Patient taking differently: Place 0.4 mg into the nose as needed (overdose).  11/28/18   [name], [name]  oxyCODONE-acetaminophen (PERCOCET) 10-325 MG tablet Take 1 tablet by mouth every 4 (four) hours as needed for up to 15 days for pain. 03/11/20 03/26/20  [name], [name]  Vitamin D, Ergocalciferol, (DRISDOL) 1.25 MG (50000 UT) CAPS capsule Take 1 capsule (50,000 Units total) by mouth every 7 (seven) days. 11/01/18   [name], [name]    ALLERGIES:  Allergies  Allergen Reactions  . Tape Rash and Other (See Comments)    PLEASE DO NOT USE THE CLEAR, THICK, "PLASTIC" TAPE- only paper tape is tolerated   . Ketorolac Swelling and Other (See Comments)    Patient reports facial edema and left arm edema after administration.    SOCIAL HISTORY:  Social History   Tobacco Use  . Smoking status: Never Smoker  . Smokeless tobacco: Never Used  Substance Use Topics  . Alcohol use: No    FAMILY HISTORY: Family History  Problem Relation Age of Onset  . Sickle cell trait Mother   . Sickle cell trait Father   . Birth defects Maternal Grandmother   . Birth defects Paternal Grandmother     EXAM: BP 105/71 (BP Location: Left Arm)   Pulse 77   Temp 98.5 F (36.9 C) (Oral)   Resp 16   SpO2 (!) 89%  CONSTITUTIONAL: Alert and oriented  and responds appropriately to questions.  Appears uncomfortable but is afebrile and nontoxic-appearing HEAD: Normocephalic EYES: Conjunctivae clear, pupils appear equal, EOM appear intact ENT: normal nose; moist mucous membranes NECK: Supple, normal ROM CARD: RRR; S1 and S2 appreciated; no murmurs, no clicks, no rubs, no gallops RESP: Normal chest excursion without splinting or tachypnea; breath sounds clear and equal bilaterally; no wheezes, no rhonchi, no rales, no respiratory distress but patient is hypoxic to 89% sitting upright awake in the bed on room air, doing well on 2 L nasal cannula ABD/GI:  Normal bowel sounds; non-distended; soft, non-tender, no rebound, no guarding, no peritoneal signs, no hepatosplenomegaly BACK:  The back appears normal EXT: Normal ROM in all joints; no deformity noted, no edema; no cyanosis SKIN: Normal color for age and race; warm; no rash on exposed skin NEURO: Moves all extremities equally PSYCH: The patient's mood and manner are appropriate.   MEDICAL DECISION MAKING: Patient here with sickle cell pain crisis.  Will treat with IV fluids, pain medication.  Will obtain labs.  Patient also found to be hypoxic here but denies chest pain or shortness of breath.  Was recently admitted for acute chest syndrome.  Will obtain chest x-ray.  Doing well on 2 L nasal cannula.  He reports he does wear oxygen at home at night.  ED PROGRESS: Patient reports pain improving.  Have discussed admission versus discharge with outpatient management.  He states he would prefer discharge home.  He feels he could manage his pain at home if he had a third dose of Dilaudid prior to discharge.  Have discussed return precautions and supportive care instructions and recommended close follow-up.  He states he has pain medicine at home.  Chest x-ray clear.  Labs appear stable from prior.  Hemoglobin 7.4 which is around his baseline.   At this time, I do not feel there is any life-threatening condition present. I have reviewed, interpreted and discussed all results (EKG, imaging, lab, urine as appropriate) and exam findings with patient/family. I have reviewed nursing notes and appropriate previous records.  I feel the patient is safe to be discharged home without further emergent workup and can continue workup as an outpatient as needed. Discussed usual and customary return precautions. Patient/family verbalize understanding and are comfortable with this plan.  Outpatient follow-up has been provided as needed. All questions have been answered.   CRITICAL CARE Performed by: Rochele Raring   Total critical care time: 45 minutes  Critical care time was exclusive of separately billable procedures and treating other patients.  Critical care was necessary to treat or prevent imminent or life-threatening deterioration.  Critical care was time spent personally by me on the following activities: development of treatment plan with patient and/or surrogate as well as nursing, discussions with consultants, evaluation of patient's response to treatment, examination of patient, obtaining history from patient or surrogate, ordering and performing treatments and interventions, ordering and review of laboratory studies, ordering and review of radiographic studies, pulse oximetry and re-evaluation of patient's condition.   Martin [name] was evaluated in Emergency Department on 03/17/2020 for the symptoms described in the history of present illness. He was evaluated in the context of the global COVID-19 pandemic, which necessitated consideration that the patient might be at risk for infection with the SARS-CoV-2 virus that causes COVID-19. Institutional protocols and algorithms that pertain to the evaluation of patients at risk for COVID-19 are in a state of rapid change based on information released by regulatory bodies  including the CDC and federal and state organizations. These policies and algorithms were followed during the patient's care in the ED.      Samaa Ueda, Layla Maw, DO 03/17/20 [phone]

## 2020-03-18 ENCOUNTER — Non-Acute Institutional Stay (HOSPITAL_COMMUNITY)
Admission: AD | Admit: 2020-03-18 | Discharge: 2020-03-18 | Disposition: A | Discharge status: Home / Self Care | Payer: Medicaid Other | Source: Ambulatory Visit | Attending: Internal Medicine | Admitting: Internal Medicine

## 2020-03-18 ENCOUNTER — Telehealth (HOSPITAL_COMMUNITY): Payer: Self-pay | Admitting: *Deleted

## 2020-03-18 DIAGNOSIS — G894 Chronic pain syndrome: Secondary | ICD-10-CM | POA: Insufficient documentation

## 2020-03-18 DIAGNOSIS — D638 Anemia in other chronic diseases classified elsewhere: Secondary | ICD-10-CM | POA: Diagnosis not present

## 2020-03-18 DIAGNOSIS — Z21 Asymptomatic human immunodeficiency virus [HIV] infection status: Secondary | ICD-10-CM | POA: Insufficient documentation

## 2020-03-18 DIAGNOSIS — Z7901 Long term (current) use of anticoagulants: Secondary | ICD-10-CM | POA: Diagnosis not present

## 2020-03-18 DIAGNOSIS — D5700 Hb-SS disease with crisis, unspecified: Secondary | ICD-10-CM | POA: Diagnosis not present

## 2020-03-18 DIAGNOSIS — J984 Other disorders of lung: Secondary | ICD-10-CM | POA: Insufficient documentation

## 2020-03-18 DIAGNOSIS — R0902 Hypoxemia: Secondary | ICD-10-CM | POA: Diagnosis not present

## 2020-03-18 DIAGNOSIS — F411 Generalized anxiety disorder: Secondary | ICD-10-CM | POA: Insufficient documentation

## 2020-03-18 DIAGNOSIS — Z888 Allergy status to other drugs, medicaments and biological substances status: Secondary | ICD-10-CM | POA: Insufficient documentation

## 2020-03-18 DIAGNOSIS — Z7952 Long term (current) use of systemic steroids: Secondary | ICD-10-CM | POA: Diagnosis not present

## 2020-03-18 DIAGNOSIS — F1120 Opioid dependence, uncomplicated: Secondary | ICD-10-CM | POA: Diagnosis not present

## 2020-03-18 DIAGNOSIS — Z86711 Personal history of pulmonary embolism: Secondary | ICD-10-CM | POA: Insufficient documentation

## 2020-03-18 DIAGNOSIS — Z79899 Other long term (current) drug therapy: Secondary | ICD-10-CM | POA: Insufficient documentation

## 2020-03-18 MED ORDER — SODIUM CHLORIDE 0.9% FLUSH: 9.0000 mL | INTRAVENOUS | Status: DC | PRN

## 2020-03-18 MED ORDER — POLYETHYLENE GLYCOL 3350 17 G PO PACK: 17.0000 g | PACK | Freq: Every day | ORAL | Status: DC | PRN

## 2020-03-18 MED ORDER — ONDANSETRON HCL 4 MG/2ML IJ SOLN: 4.0000 mg | Freq: Four times a day (QID) | INTRAMUSCULAR | Status: DC | PRN

## 2020-03-18 MED ORDER — ACETAMINOPHEN 325 MG PO TABS
650.0000 mg | ORAL_TABLET | ORAL | Status: DC | PRN
  Administered 2020-03-18: 650 mg via ORAL
  Filled 2020-03-18: qty 2

## 2020-03-18 MED ORDER — HYDROMORPHONE 1 MG/ML IV SOLN
INTRAVENOUS | Status: DC
  Administered 2020-03-18: 14.5 mg via INTRAVENOUS
  Filled 2020-03-18: qty 25

## 2020-03-18 MED ORDER — ACETAMINOPHEN 650 MG RE SUPP
650.0000 mg | RECTAL | Status: DC | PRN
  Filled 2020-03-18: qty 1

## 2020-03-18 MED ORDER — HEPARIN SOD (PORK) LOCK FLUSH 100 UNIT/ML IV SOLN
500.0000 [IU] | INTRAVENOUS | Status: AC | PRN
  Administered 2020-03-18: 500 [IU]
  Filled 2020-03-18: qty 5

## 2020-03-18 MED ORDER — SENNOSIDES-DOCUSATE SODIUM 8.6-50 MG PO TABS
1.0000 | ORAL_TABLET | Freq: Two times a day (BID) | ORAL | Status: DC
  Administered 2020-03-18: 1 via ORAL
  Filled 2020-03-18: qty 1

## 2020-03-18 MED ORDER — SODIUM CHLORIDE 0.9 % IV SOLN
25.0000 mg | INTRAVENOUS | Status: DC | PRN
  Filled 2020-03-18: qty 0.5

## 2020-03-18 MED ORDER — DIPHENHYDRAMINE HCL 25 MG PO CAPS
25.0000 mg | ORAL_CAPSULE | ORAL | Status: DC | PRN
  Administered 2020-03-18: 25 mg via ORAL
  Filled 2020-03-18: qty 1

## 2020-03-18 MED ORDER — NALOXONE HCL 0.4 MG/ML IJ SOLN: 0.4000 mg | INTRAMUSCULAR | Status: DC | PRN

## 2020-03-18 MED ORDER — SODIUM CHLORIDE 0.9% FLUSH
10.0000 mL | INTRAVENOUS | Status: AC | PRN
  Administered 2020-03-18: 10 mL

## 2020-03-18 MED ORDER — SODIUM CHLORIDE 0.45 % IV SOLN: INTRAVENOUS | Status: DC

## 2020-03-18 NOTE — Progress Notes (Signed)
Pt admitted to the day hospital for treatment of sickle cell pain crisis. Pt reported generalized pain rated a 10/10. Pt given PO Benadryl and Tylenol ,placed on Dilaudid PCA, and hydrated with IV fluids. At discharge patient reported pain a 5/10. Pt declined AVS printout. Pt alert , oriented and ambulatory at discharge.

## 2020-03-18 NOTE — Discharge Summary (Signed)
Physician Discharge Summary  Martin [name] HYW:[id number] DOB: Jul 11, 1990 DOA: 03/18/2020  PCP: [name], [name]  Admit date: 03/18/2020  Discharge date: 03/18/2020  Time spent: 30 minutes  Discharge Diagnoses:  Active Problems:   Sickle cell anemia with pain [facility])   Discharge Condition: Stable  Diet recommendation: Regular  History of present illness:  Martin [name] is a 29 y.o. male with history of sickle cell disease, chronic pain syndrome, opiate dependence and tolerance, history of PE on Eliquis, HIV disease, generalized anxiety disorder and anemia of chronic disease who presented to the day hospital this morning with major complaints of generalized body pain that is consistent with his typical sickle cell pain crisis.  Patient was seen in the emergency room yesterday for the same complaint, was treated and discharged home but pain returned this morning and was not responding to home pain medications.  Patient denies any fever, headache, cough, chest pain, shortness of breath, dizziness, nausea, vomiting or diarrhea.  No urinary symptoms.  He denies any sick contact, recent travel or exposure to COVID-19  Hospital Course:  Martin [name] was admitted to the day hospital with sickle cell painful crisis. Patient was treated with IV fluid, weight based IV Dilaudid PCA, clinician assisted doses as deemed appropriate, and other adjunct therapies per sickle cell pain management protocol. Martin [name] showed significant improvement symptomatically, pain improved from 9/10 to 5/10 at the time of discharge. Patient was discharged home in a hemodynamically stable condition. Martin [name] will follow-up at the clinic as previously scheduled, continue with home medications as per prior to admission.  Discharge Instructions We discussed the need for good hydration, monitoring of hydration status, avoidance of heat, cold, stress, and infection triggers. We discussed the need to be  compliant with taking Hydrea and other home medications. Martin [name] of the need to seek medical attention immediately if any symptom of bleeding, anemia, or infection occurs.  Discharge Exam: Vitals:   03/18/20 1215 03/18/20 1405  BP: 112/70 109/68  Pulse: 68 76  Resp: 16 16  Temp: 98.6 F (37 C) 98.2 F (36.8 C)  SpO2: 95% 94%    General appearance: alert, cooperative and no distress Eyes: conjunctivae/corneas clear. PERRL, EOM's intact. Fundi benign. Neck: no adenopathy, no carotid bruit, no JVD, supple, symmetrical, trachea midline and thyroid not enlarged, symmetric, no tenderness/mass/nodules Back: symmetric, no curvature. ROM normal. No CVA tenderness. Resp: clear to auscultation bilaterally Chest wall: no tenderness Cardio: regular rate and rhythm, S1, S2 normal, no murmur, click, rub or gallop GI: soft, non-tender; bowel sounds normal; no masses,  no organomegaly Extremities: extremities normal, atraumatic, no cyanosis or edema Pulses: 2+ and symmetric Skin: Skin color, texture, turgor normal. No rashes or lesions Neurologic: Grossly normal  Discharge Instructions    Diet - low sodium heart healthy   Complete by: As directed    Increase activity slowly   Complete by: As directed      Allergies as of 03/18/2020      Reactions   Tape Rash, Other (See Comments)   PLEASE DO NOT USE THE CLEAR, THICK, "PLASTIC" TAPE- only paper tape is tolerated    Ketorolac Swelling, Other (See Comments)   Patient reports facial edema and left arm edema after administration.      Medication List    TAKE these medications   albuterol 108 (90 Base) MCG/ACT inhaler Commonly known as: VENTOLIN HFA Inhale into the lungs.   apixaban 5 MG Tabs tablet Commonly known as: Eliquis  Take 1 tablet (5 mg total) by mouth 2 (two) times daily.   cetirizine 10 MG tablet Commonly known as: ZYRTEC Take 1 tablet (10 mg total) by mouth daily. What changed:   when to take  this  reasons to take this   hydroxyurea 500 MG capsule Commonly known as: HYDREA Take 4 capsules (2,000 mg total) by mouth at bedtime.   multivitamin with minerals Tabs tablet Take 1 tablet by mouth daily with breakfast.   naloxone 0.4 MG/ML injection Commonly known as: NARCAN As needed. What changed:   how much to take  how to take this  when to take this  reasons to take this  additional instructions   oxyCODONE-acetaminophen 10-325 MG tablet Commonly known as: PERCOCET Take 1 tablet by mouth every 4 (four) hours as needed for up to 15 days for pain.   predniSONE 20 MG tablet Commonly known as: DELTASONE Take 20 mg by mouth 2 (two) times daily.   Triumeq 600-50-300 MG tablet Generic drug: abacavir-dolutegravir-lamiVUDine Take 1 tablet by mouth at bedtime.   Vitamin D (Ergocalciferol) 1.25 MG (50000 UNIT) Caps capsule Commonly known as: DRISDOL Take 1 capsule (50,000 Units total) by mouth every 7 (seven) days.      Allergies  Allergen Reactions  . Tape Rash and Other (See Comments)    PLEASE DO NOT USE THE CLEAR, THICK, "PLASTIC" TAPE- only paper tape is tolerated   . Ketorolac Swelling and Other (See Comments)    Patient reports facial edema and left arm edema after administration.     Significant Diagnostic Studies: DG Chest 2 View  Result Date: 03/17/2020 CLINICAL DATA:  Hypoxia.  Sickle cell disease. EXAM: CHEST - 2 VIEW COMPARISON:  February 21, 2020 FINDINGS: There is a well-positioned right-sided Port-A-Cath in place. Chronic coarse interstitial lung markings are again noted. There is no pneumothorax. No large pleural effusion. The heart size is stable. There is no acute osseous abnormality. There is no focal infiltrate. IMPRESSION: No active cardiopulmonary disease. Electronically Signed   By: Katherine Mantle M.D.   On: 03/17/2020 03:41   DG Chest 2 View  Result Date: 02/21/2020 CLINICAL DATA:  29 year old male with sickle cell disease. HIV.  Pain symptoms. EXAM: CHEST - 2 VIEW COMPARISON:  Portable chest 02/19/2020 and earlier. FINDINGS: PA and lateral views now. Stable right chest power port, accessed. Cardiac size is within normal limits. Other mediastinal contours are within normal limits. Visualized tracheal air column is within normal limits. No pneumothorax or pleural effusion. No consolidation. Coarse bilateral pulmonary interstitial opacity appears stable over this series of exams. No new pulmonary opacity. Stable visualized osseous structures. Paucity of bowel gas in the upper abdomen. IMPRESSION: Coarse pulmonary interstitial changes are chronic and stable. No acute cardiopulmonary abnormality. Electronically Signed   By: Odessa Fleming M.D.   On: 02/21/2020 14:52   DG Chest Port 1 View  Result Date: 02/19/2020 CLINICAL DATA:  Hypoxia. Additional history provided: Chest pain due to sickle cell pain crisis EXAM: PORTABLE CHEST 1 VIEW COMPARISON:  Prior chest radiographs 01/29/2020 and earlier. FINDINGS: Unchanged position of a right chest infusion port catheter. Heart size at the upper limits of normal. Suspected subtle patchy airspace disease within the mid to lower lung fields bilaterally. Unchanged background chronic prominence of the interstitial lung markings/pulmonary scarring. No evidence of pleural effusion or pneumothorax. No acute bony abnormality identified. IMPRESSION: Suspected subtle patchy airspace disease within the mid to lower lung fields bilaterally. Stable background chronic interstitial prominence/pulmonary scarring. Electronically  Signed   By: Jackey Loge DO   On: 02/19/2020 08:05    Signed:  Jeanann Lewandowsky MD, MHA, FACP, Constance Goltz, CPE   03/18/2020, 3:39 PM

## 2020-03-18 NOTE — H&P (Addendum)
[facility] History and Physical  Martin [name] QQP:[id number] DOB: 27-Aug-1990 DOA: 03/18/2020  PCP: [name], [name]   Chief Complaint: Sickle cell pain  HPI: Martin [name] is a 29 y.o. male with history of sickle cell disease, chronic pain syndrome, opiate dependence and tolerance, history of PE on Eliquis, HIV disease, generalized anxiety disorder and anemia of chronic disease who presented to the day hospital this morning with major complaints of generalized body pain that is consistent with his typical sickle cell pain crisis.  Patient was seen in the emergency room yesterday for the same complaint, was treated and discharged home but pain returned this morning and was not responding to home pain medications.  Patient denies any fever, headache, cough, chest pain, shortness of breath, dizziness, nausea, vomiting or diarrhea.  No urinary symptoms.  He denies any sick contact, recent travel or exposure to COVID-19.  Systemic Review: General: The patient denies anorexia, fever, weight loss Cardiac: Denies chest pain, syncope, palpitations, pedal edema  Respiratory: Denies cough, shortness of breath, wheezing GI: Denies severe indigestion/heartburn, abdominal pain, nausea, vomiting, diarrhea and constipation GU: Denies hematuria, incontinence, dysuria  Musculoskeletal: Denies arthritis  Skin: Denies suspicious skin lesions Neurologic: Denies focal weakness or numbness, change in vision  All other systems reviewed and are negative.  Past Medical History:  Diagnosis Date  . Anxiety   . HIV (human immunodeficiency virus infection) (HCC)   . Proteinuria   . Sickle cell crisis (HCC)   . 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HCC)   . Vitamin D deficiency 10/2018    Past Surgical History:  Procedure Laterality Date  . IR IMAGING GUIDED PORT INSERTION  08/29/2019    Allergies  Allergen Reactions  . Tape Rash and Other (See Comments)    PLEASE DO NOT USE THE CLEAR,  THICK, "PLASTIC" TAPE- only paper tape is tolerated   . Ketorolac Swelling and Other (See Comments)    Patient reports facial edema and left arm edema after administration.    Family History  Problem Relation Age of Onset  . Sickle cell trait Mother   . Sickle cell trait Father   . Birth defects Maternal Grandmother   . Birth defects Paternal Grandmother       Prior to Admission medications   Medication Sig Start Date End Date Taking? Authorizing Provider  abacavir-dolutegravir-lamiVUDine (TRIUMEQ) 600-50-300 MG tablet Take 1 tablet by mouth at bedtime.     [provider]  albuterol (VENTOLIN HFA) 108 (90 Base) MCG/ACT inhaler Inhale into the lungs. 03/06/20   [provider]  apixaban (ELIQUIS) 5 MG TABS tablet Take 1 tablet (5 mg total) by mouth 2 (two) times daily. 08/21/19   Massie Maroon, [name]  cetirizine (ZYRTEC) 10 MG tablet Take 1 tablet (10 mg total) by mouth daily. Patient taking differently: Take 10 mg by mouth daily as needed for allergies.  08/04/19   [name], [name]  hydroxyurea (HYDREA) 500 MG capsule Take 4 capsules (2,000 mg total) by mouth at bedtime. 12/08/19   [name], [name]  Multiple Vitamin (MULTIVITAMIN WITH MINERALS) TABS tablet Take 1 tablet by mouth daily with breakfast.     [provider]  naloxone (NARCAN) 0.4 MG/ML injection As needed. Patient taking differently: Place 0.4 mg into the nose as needed (overdose).  11/28/18   [name], [name]  oxyCODONE-acetaminophen (PERCOCET) 10-325 MG tablet Take 1 tablet by mouth every 4 (four) hours as needed for up to 15 days  for pain. 03/11/20 03/26/20  [name], [name]  predniSONE (DELTASONE) 20 MG tablet Take 20 mg by mouth 2 (two) times daily. 03/06/20   [provider]  Vitamin D, Ergocalciferol, (DRISDOL) 1.25 MG (50000 UT) CAPS capsule Take 1 capsule (50,000 Units total) by mouth every 7 (seven) days. 11/01/18   [name], [name]     Physical  Exam: Vitals:   03/18/20 1041  BP: 115/69  Pulse: 73  Resp: 18  Temp: 98 F (36.7 C)  TempSrc: Oral  SpO2: 93%    General: Alert, awake, afebrile, anicteric, not in obvious distress HEENT: Normocephalic and Atraumatic, Mucous membranes pink                PERRLA; EOM intact; No scleral icterus,                 Nares: Patent, Oropharynx: Clear, Fair Dentition                 Neck: FROM, no cervical lymphadenopathy, thyromegaly, carotid bruit or JVD;  CHEST WALL: No tenderness  CHEST: Normal respiration, clear to auscultation bilaterally  HEART: Regular rate and rhythm; no murmurs rubs or gallops  BACK: No kyphosis or scoliosis; no CVA tenderness  ABDOMEN: Positive Bowel Sounds, soft, non-tender; no masses, no organomegaly EXTREMITIES: No cyanosis, clubbing, or edema SKIN:  no rash or ulceration  CNS: Alert and Oriented x 4, Nonfocal exam, CN 2-12 intact  Labs on Admission:  Basic Metabolic Panel: Recent Labs  Lab 03/17/20 0245  NA 140  K 3.5  CL 110  CO2 23  GLUCOSE 97  BUN 10  CREATININE 0.77  CALCIUM 8.8*   Liver Function Tests: Recent Labs  Lab 03/17/20 0245  AST 33  ALT 20  ALKPHOS 49  BILITOT 5.8*  PROT 8.5*  ALBUMIN 3.8   No results for input(s): LIPASE, AMYLASE in the last 168 hours. No results for input(s): AMMONIA in the last 168 hours. CBC: Recent Labs  Lab 03/17/20 0245  WBC 10.4  NEUTROABS 3.8  HGB 7.4*  HCT 20.5*  MCV 99.5  PLT 408*   Cardiac Enzymes: No results for input(s): CKTOTAL, CKMB, CKMBINDEX, TROPONINI in the last 168 hours.  BNP (last 3 results) No results for input(s): BNP in the last 8760 hours.  ProBNP (last 3 results) No results for input(s): PROBNP in the last 8760 hours.  CBG: No results for input(s): GLUCAP in the last 168 hours.   Assessment/Plan Active Problems:   Sickle cell anemia with pain (HCC)   Admits to the Day Hospital for extended observation  IVF 0.45% Saline @ 125 mls/hour  Weight based  Dilaudid PCA started within 30 minutes of admission  IV Toradol is contraindicated due to allergy  Acetaminophen 1000 mg x 1 dose  Labs: Recent lab results reviewed from yesterday, mostly at baseline, no need for lab draw today.  Monitor vitals very closely, Re-evaluate pain scale every hour  2 L of Oxygen by [place]  Patient will be re-evaluated for pain in the context of function and relationship to baseline as care progresses.  If no significant relieve from pain (remains above 5/10) will transfer patient to inpatient services for further evaluation and management  Code Status: Full  Family Communication: None  DVT Prophylaxis: Ambulate as tolerated   Time spent: 35 Minutes  Jeanann Lewandowsky, MD, MHA, FACP, FAAP, CPE  If 7PM-7AM, please contact night-coverage [email or web address] 03/18/2020, 10:52 AM

## 2020-03-18 NOTE — Telephone Encounter (Signed)
Patient called requesting to come to the day hospital for sickle cell pain. Patient reports generalized pain rated 10/10. Reports taking Percocet last night but still has medication available. COVID-19 screening done and patient denies all symptoms and exposures. Denies fever, chest pain, nausea, vomiting, diarrhea, abdominal pain and priapism. Admits to having transportation at discharge without driving self. Dr. Hyman Hopes notified. Patient can come to the day hospital for pain management. Patient advised and expresses an understanding.

## 2020-03-22 ENCOUNTER — Telehealth: Payer: Self-pay | Admitting: Family Medicine

## 2020-03-22 ENCOUNTER — Other Ambulatory Visit: Payer: Self-pay | Admitting: Family Medicine

## 2020-03-22 DIAGNOSIS — D571 Sickle-cell disease without crisis: Secondary | ICD-10-CM

## 2020-03-22 DIAGNOSIS — G8929 Other chronic pain: Secondary | ICD-10-CM

## 2020-03-22 MED ORDER — OXYCODONE-ACETAMINOPHEN 10-325 MG PO TABS: 1.0000 | ORAL_TABLET | ORAL | 0 refills | Status: DC | PRN

## 2020-03-26 ENCOUNTER — Telehealth (INDEPENDENT_AMBULATORY_CARE_PROVIDER_SITE_OTHER): Payer: Medicaid Other | Admitting: Family Medicine

## 2020-03-26 ENCOUNTER — Other Ambulatory Visit: Payer: Self-pay | Admitting: Family Medicine

## 2020-03-26 ENCOUNTER — Encounter: Payer: Self-pay | Admitting: Family Medicine

## 2020-03-26 ENCOUNTER — Telehealth: Payer: Self-pay | Admitting: Family Medicine

## 2020-03-26 VITALS — Ht 75.0 in | Wt 148.0 lb

## 2020-03-26 DIAGNOSIS — Z79899 Other long term (current) drug therapy: Secondary | ICD-10-CM | POA: Diagnosis not present

## 2020-03-26 DIAGNOSIS — G8929 Other chronic pain: Secondary | ICD-10-CM | POA: Diagnosis not present

## 2020-03-26 DIAGNOSIS — F1190 Opioid use, unspecified, uncomplicated: Secondary | ICD-10-CM | POA: Diagnosis not present

## 2020-03-26 DIAGNOSIS — D571 Sickle-cell disease without crisis: Secondary | ICD-10-CM

## 2020-03-26 DIAGNOSIS — F419 Anxiety disorder, unspecified: Secondary | ICD-10-CM

## 2020-03-26 DIAGNOSIS — G4700 Insomnia, unspecified: Secondary | ICD-10-CM

## 2020-03-26 DIAGNOSIS — Z09 Encounter for follow-up examination after completed treatment for conditions other than malignant neoplasm: Secondary | ICD-10-CM

## 2020-03-26 MED ORDER — TRAZODONE HCL 100 MG PO TABS: 100.0000 mg | ORAL_TABLET | Freq: Every evening | ORAL | 3 refills | Status: DC | PRN

## 2020-03-26 NOTE — Progress Notes (Signed)
Virtual Visit via Telephone Note  I connected with Martin [name] on 03/28/20 at  3:20 PM EST by telephone and verified that I am speaking with the correct person using two identifiers.  Location: Patient: Home Provider: Office   I discussed the limitations, risks, security and privacy concerns of performing an evaluation and management service by telephone and the availability of in person appointments. I also discussed with the patient that there may be a patient responsible charge related to this service. The patient expressed understanding and agreed to proceed.   History of Present Illness:  Patient Active Problem List   Diagnosis Date Noted  . 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/18/2020  . 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 Itching of male genitalia 02/22/2020  . Abnormal penile discharge, without blood 02/22/2020  . Acute chest syndrome (HCC) 02/19/2020  . Leukocytosis 01/02/2020  . Anxiety 11/27/2019  . Proteinuria 11/27/2019  . Scleral icterus   . Chronic, continuous use of opioids 08/15/2019  . Seasonal allergies 08/15/2019  . Hypoxia   . Chest congestion   . Sickle cell anemia with crisis (HCC) 08/04/2019  . Other chronic pain 08/04/2019  . History of pulmonary embolus (PE) 08/04/2019  . Acute pulmonary embolism (HCC) 02/10/2019  . Acute chest syndrome due to sickle cell crisis (HCC) 02/10/2019  . Sickle cell crisis (HCC) 01/17/2019  . Heart murmur 02/03/2017  . Sickle cell disease with crisis (HCC) 02/02/2017  . 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 Sickle cell pain crisis (HCC) 02/02/2017  . 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HCC) 12/20/2016  . Vitamin D deficiency 08/14/2016  . High risk medication use 09/27/2014  . Generalized anxiety disorder 05/19/2014  . GERD (gastroesophageal reflux disease) 05/19/2014  . HIV (human immunodeficiency virus infection) (HCC) 05/23/2012     Current Status: Since his last office visit, he is doing well with no complaints. He  states that he has pain in his arms and legs. He rates his pain today at 5/10. He has not had a hospital visit for Sickle Cell Crisis since 03/18/2020 where he was treated and discharged the same day. He is currently taking all medications as prescribed and staying well hydrated. He reports occasional nausea, constipation, dizziness and headaches.  He denies fevers, chills, fatigue, recent infections, weight loss, and night sweats. He has not had any headaches, visual changes, dizziness, and falls. No chest pain, heart palpitations, cough and shortness of breath reported. Denies GI problems such as nausea, vomiting, diarrhea, and constipation. He has no reports of blood in stools, dysuria and hematuria. No depression or anxiety, and denies suicidal ideations, homicidal ideations, or auditory hallucinations. He is taking all medications as prescribed. He denies pain today.   Observations/Objective:  Telephone Visit   Assessment and Plan:  1. Medication management  2. Hb-SS disease without crisis [facility]) He is doing well today r/t his chronic pain management. He will continue to take pain medications as prescribed; will continue to avoid extreme heat and cold; will continue to eat a healthy diet and drink at least 64 ounces of water daily; continue stool softener as needed; will avoid colds and flu; will continue to get plenty of sleep and rest; will continue to avoid high stressful situations and remain infection free; will continue Folic Acid 1 mg daily to avoid sickle cell crisis. Continue to follow up with Hematologist as needed.   3. Other chronic pain  4. Chronic, continuous use of opioids  5. Anxiety  6. Follow up He will keep  follow up appointment.   No orders of the defined types were placed in this encounter.   No orders of the defined types were placed in this encounter.   Referral Orders  No referral(s) requested today    [name], [name], ANE, [name]-BC [facility] [address] [place], [place] [address] [phone]- fax  I discussed the assessment and treatment plan with the patient. The patient was provided an opportunity to ask questions and all were answered. The patient agreed with the plan and demonstrated an understanding of the instructions.   The patient was advised to call back or seek an in-person evaluation if the symptoms worsen or if the condition fails to improve as anticipated.  I provided 20 minutes of non-face-to-face time during this encounter.   [name], [name]

## 2020-03-26 NOTE — Telephone Encounter (Signed)
Done

## 2020-03-27 NOTE — Telephone Encounter (Signed)
Done

## 2020-03-28 ENCOUNTER — Encounter: Payer: Self-pay | Admitting: Family Medicine

## 2020-03-29 NOTE — Telephone Encounter (Signed)
Pt. Requesting another medication to replace Trazodone.

## 2020-04-01 ENCOUNTER — Encounter (HOSPITAL_COMMUNITY): Payer: Self-pay

## 2020-04-01 ENCOUNTER — Emergency Department (HOSPITAL_COMMUNITY): Payer: Medicaid Other

## 2020-04-01 ENCOUNTER — Inpatient Hospital Stay (HOSPITAL_COMMUNITY)
Admission: EM | Admit: 2020-04-01 | Discharge: 2020-04-05 | DRG: 811 | Disposition: A | Discharge status: Home / Self Care | Payer: Medicaid Other | Attending: Internal Medicine | Admitting: Internal Medicine

## 2020-04-01 ENCOUNTER — Other Ambulatory Visit: Payer: Self-pay

## 2020-04-01 DIAGNOSIS — G8929 Other chronic pain: Secondary | ICD-10-CM

## 2020-04-01 DIAGNOSIS — Z79891 Long term (current) use of opiate analgesic: Secondary | ICD-10-CM

## 2020-04-01 DIAGNOSIS — J0110 Acute frontal sinusitis, unspecified: Secondary | ICD-10-CM | POA: Diagnosis present

## 2020-04-01 DIAGNOSIS — Z681 Body mass index (BMI) 19 or less, adult: Secondary | ICD-10-CM

## 2020-04-01 DIAGNOSIS — J9610 Chronic respiratory failure, unspecified whether with hypoxia or hypercapnia: Secondary | ICD-10-CM

## 2020-04-01 DIAGNOSIS — Z20822 Contact with and (suspected) exposure to covid-19: Secondary | ICD-10-CM | POA: Diagnosis present

## 2020-04-01 DIAGNOSIS — D72829 Elevated white blood cell count, unspecified: Secondary | ICD-10-CM | POA: Diagnosis present

## 2020-04-01 DIAGNOSIS — Z832 Family history of diseases of the blood and blood-forming organs and certain disorders involving the immune mechanism: Secondary | ICD-10-CM

## 2020-04-01 DIAGNOSIS — Z91048 Other nonmedicinal substance allergy status: Secondary | ICD-10-CM

## 2020-04-01 DIAGNOSIS — K219 Gastro-esophageal reflux disease without esophagitis: Secondary | ICD-10-CM | POA: Diagnosis present

## 2020-04-01 DIAGNOSIS — D5701 Hb-SS disease with acute chest syndrome: Secondary | ICD-10-CM | POA: Diagnosis present

## 2020-04-01 DIAGNOSIS — J129 Viral pneumonia, unspecified: Secondary | ICD-10-CM

## 2020-04-01 DIAGNOSIS — F411 Generalized anxiety disorder: Secondary | ICD-10-CM | POA: Diagnosis not present

## 2020-04-01 DIAGNOSIS — Z9981 Dependence on supplemental oxygen: Secondary | ICD-10-CM

## 2020-04-01 DIAGNOSIS — Z21 Asymptomatic human immunodeficiency virus [HIV] infection status: Secondary | ICD-10-CM | POA: Diagnosis present

## 2020-04-01 DIAGNOSIS — D730 Hyposplenism: Secondary | ICD-10-CM | POA: Diagnosis present

## 2020-04-01 DIAGNOSIS — Z888 Allergy status to other drugs, medicaments and biological substances status: Secondary | ICD-10-CM

## 2020-04-01 DIAGNOSIS — R0902 Hypoxemia: Secondary | ICD-10-CM

## 2020-04-01 DIAGNOSIS — Z9189 Other specified personal risk factors, not elsewhere classified: Secondary | ICD-10-CM

## 2020-04-01 DIAGNOSIS — F1190 Opioid use, unspecified, uncomplicated: Secondary | ICD-10-CM | POA: Diagnosis present

## 2020-04-01 DIAGNOSIS — Z7901 Long term (current) use of anticoagulants: Secondary | ICD-10-CM

## 2020-04-01 DIAGNOSIS — D638 Anemia in other chronic diseases classified elsewhere: Secondary | ICD-10-CM | POA: Diagnosis present

## 2020-04-01 DIAGNOSIS — D571 Sickle-cell disease without crisis: Secondary | ICD-10-CM

## 2020-04-01 DIAGNOSIS — G894 Chronic pain syndrome: Secondary | ICD-10-CM | POA: Diagnosis present

## 2020-04-01 DIAGNOSIS — B9781 Human metapneumovirus as the cause of diseases classified elsewhere: Secondary | ICD-10-CM | POA: Diagnosis present

## 2020-04-01 DIAGNOSIS — J0100 Acute maxillary sinusitis, unspecified: Secondary | ICD-10-CM | POA: Diagnosis present

## 2020-04-01 DIAGNOSIS — J9620 Acute and chronic respiratory failure, unspecified whether with hypoxia or hypercapnia: Secondary | ICD-10-CM

## 2020-04-01 DIAGNOSIS — Z86711 Personal history of pulmonary embolism: Secondary | ICD-10-CM | POA: Diagnosis present

## 2020-04-01 DIAGNOSIS — R636 Underweight: Secondary | ICD-10-CM | POA: Diagnosis present

## 2020-04-01 DIAGNOSIS — B20 Human immunodeficiency virus [HIV] disease: Secondary | ICD-10-CM | POA: Diagnosis present

## 2020-04-01 DIAGNOSIS — D5700 Hb-SS disease with crisis, unspecified: Principal | ICD-10-CM

## 2020-04-01 DIAGNOSIS — J9621 Acute and chronic respiratory failure with hypoxia: Secondary | ICD-10-CM

## 2020-04-01 DIAGNOSIS — E559 Vitamin D deficiency, unspecified: Secondary | ICD-10-CM | POA: Diagnosis present

## 2020-04-01 LAB — CBC WITH DIFFERENTIAL/PLATELET
Abs Immature Granulocytes: 0.06 10*3/uL (ref 0.00–0.07)
Basophils Absolute: 0 10*3/uL (ref 0.0–0.1)
Basophils Relative: 0 %
Eosinophils Absolute: 0.1 10*3/uL (ref 0.0–0.5)
Eosinophils Relative: 1 %
HCT: 20.4 % — ABNORMAL LOW (ref 39.0–52.0)
Hemoglobin: 7.4 g/dL — ABNORMAL LOW (ref 13.0–17.0)
Immature Granulocytes: 0 %
Lymphocytes Relative: 50 %
Lymphs Abs: 6.6 10*3/uL — ABNORMAL HIGH (ref 0.7–4.0)
MCH: 35.1 pg — ABNORMAL HIGH (ref 26.0–34.0)
MCHC: 36.3 g/dL — ABNORMAL HIGH (ref 30.0–36.0)
MCV: 96.7 fL (ref 80.0–100.0)
Monocytes Absolute: 1.8 10*3/uL — ABNORMAL HIGH (ref 0.1–1.0)
Monocytes Relative: 13 %
Neutro Abs: 4.9 10*3/uL (ref 1.7–7.7)
Neutrophils Relative %: 36 %
Platelets: 289 10*3/uL (ref 150–400)
RBC: 2.11 MIL/uL — ABNORMAL LOW (ref 4.22–5.81)
RDW: 27.4 % — ABNORMAL HIGH (ref 11.5–15.5)
WBC: 13.1 10*3/uL — ABNORMAL HIGH (ref 4.0–10.5)
nRBC: 9.9 % — ABNORMAL HIGH (ref 0.0–0.2)

## 2020-04-01 LAB — COMPREHENSIVE METABOLIC PANEL
ALT: 29 U/L (ref 0–44)
AST: 54 U/L — ABNORMAL HIGH (ref 15–41)
Albumin: 4.2 g/dL (ref 3.5–5.0)
Alkaline Phosphatase: 54 U/L (ref 38–126)
Anion gap: 8 (ref 5–15)
BUN: 10 mg/dL (ref 6–20)
CO2: 24 mmol/L (ref 22–32)
Calcium: 8.5 mg/dL — ABNORMAL LOW (ref 8.9–10.3)
Chloride: 102 mmol/L (ref 98–111)
Creatinine, Ser: 0.71 mg/dL (ref 0.61–1.24)
GFR, Estimated: >60 mL/min (ref >60)
Glucose, Bld: 96 mg/dL (ref 70–99)
Potassium: 4.2 mmol/L (ref 3.5–5.1)
Sodium: 134 mmol/L — ABNORMAL LOW (ref 135–145)
Total Bilirubin: 9.7 mg/dL — ABNORMAL HIGH (ref 0.3–1.2)
Total Protein: 8.7 g/dL — ABNORMAL HIGH (ref 6.5–8.1)

## 2020-04-01 LAB — RESP PANEL BY RT-PCR (FLU A&B, COVID) ARPGX2
Influenza A by PCR: NEGATIVE
Influenza B by PCR: NEGATIVE
SARS Coronavirus 2 by RT PCR: NEGATIVE

## 2020-04-01 LAB — URINALYSIS, COMPLETE (UACMP) WITH MICROSCOPIC
Bacteria, UA: NONE SEEN
Bilirubin Urine: NEGATIVE
Glucose, UA: NEGATIVE mg/dL
Hgb urine dipstick: NEGATIVE
Ketones, ur: NEGATIVE mg/dL
Leukocytes,Ua: NEGATIVE
Nitrite: NEGATIVE
Protein, ur: NEGATIVE mg/dL
Specific Gravity, Urine: 1.024 (ref 1.005–1.030)
pH: 5 (ref 5.0–8.0)

## 2020-04-01 LAB — RETICULOCYTES
Immature Retic Fract: 15.7 % (ref 2.3–15.9)
RBC.: 2.1 MIL/uL — ABNORMAL LOW (ref 4.22–5.81)
Retic Count, Absolute: 441 10*3/uL — ABNORMAL HIGH (ref 19.0–186.0)
Retic Ct Pct: 21 % — ABNORMAL HIGH (ref 0.4–3.1)

## 2020-04-01 MED ORDER — DEXTROSE-NACL 5-0.45 % IV SOLN: INTRAVENOUS | Status: DC

## 2020-04-01 MED ORDER — SODIUM CHLORIDE (PF) 0.9 % IJ SOLN
INTRAMUSCULAR | Status: AC
  Filled 2020-04-01: qty 50

## 2020-04-01 MED ORDER — ADULT MULTIVITAMIN W/MINERALS CH
1.0000 | ORAL_TABLET | Freq: Every day | ORAL | Status: DC
  Administered 2020-04-02 – 2020-04-05 (×4): 1 via ORAL
  Filled 2020-04-01 (×4): qty 1

## 2020-04-01 MED ORDER — SENNOSIDES-DOCUSATE SODIUM 8.6-50 MG PO TABS
1.0000 | ORAL_TABLET | Freq: Two times a day (BID) | ORAL | Status: DC
  Administered 2020-04-01 – 2020-04-04 (×7): 1 via ORAL
  Filled 2020-04-01 (×8): qty 1

## 2020-04-01 MED ORDER — POLYETHYLENE GLYCOL 3350 17 G PO PACK: 17.0000 g | PACK | Freq: Every day | ORAL | Status: DC | PRN

## 2020-04-01 MED ORDER — DIPHENHYDRAMINE HCL 25 MG PO CAPS
25.0000 mg | ORAL_CAPSULE | ORAL | Status: DC | PRN
  Administered 2020-04-03: 25 mg via ORAL
  Filled 2020-04-01: qty 1

## 2020-04-01 MED ORDER — HYDROMORPHONE HCL 1 MG/ML IJ SOLN
1.0000 mg | Freq: Once | INTRAMUSCULAR | Status: AC
  Administered 2020-04-01: 1 mg via INTRAVENOUS
  Filled 2020-04-01: qty 1

## 2020-04-01 MED ORDER — ABACAVIR-DOLUTEGRAVIR-LAMIVUD 600-50-300 MG PO TABS
1.0000 | ORAL_TABLET | Freq: Every day | ORAL | Status: DC
  Administered 2020-04-01 – 2020-04-04 (×4): 1 via ORAL
  Filled 2020-04-01 (×4): qty 1

## 2020-04-01 MED ORDER — NALOXONE HCL 0.4 MG/ML IJ SOLN
0.4000 mg | INTRAMUSCULAR | Status: DC | PRN
  Filled 2020-04-01: qty 1

## 2020-04-01 MED ORDER — HYDROMORPHONE HCL 1 MG/ML IJ SOLN
1.0000 mg | INTRAMUSCULAR | Status: AC
  Administered 2020-04-01: 1 mg via INTRAVENOUS
  Filled 2020-04-01: qty 1

## 2020-04-01 MED ORDER — HYDROMORPHONE HCL 2 MG/ML IJ SOLN: 2.0000 mg | INTRAMUSCULAR | Status: DC | PRN

## 2020-04-01 MED ORDER — IOHEXOL 350 MG/ML SOLN
100.0000 mL | Freq: Once | INTRAVENOUS | Status: AC | PRN
  Administered 2020-04-01: 80 mL via INTRAVENOUS

## 2020-04-01 MED ORDER — LIP MEDEX EX OINT
TOPICAL_OINTMENT | CUTANEOUS | Status: AC
  Filled 2020-04-01: qty 7

## 2020-04-01 MED ORDER — HYDROXYUREA 500 MG PO CAPS
2000.0000 mg | ORAL_CAPSULE | Freq: Every day | ORAL | Status: DC
  Administered 2020-04-01 – 2020-04-04 (×4): 2000 mg via ORAL
  Filled 2020-04-01 (×6): qty 4

## 2020-04-01 MED ORDER — SODIUM CHLORIDE 0.9% FLUSH: 9.0000 mL | INTRAVENOUS | Status: DC | PRN

## 2020-04-01 MED ORDER — ONDANSETRON HCL 4 MG/2ML IJ SOLN
4.0000 mg | Freq: Four times a day (QID) | INTRAMUSCULAR | Status: DC | PRN
  Administered 2020-04-02 (×2): 4 mg via INTRAVENOUS
  Filled 2020-04-01 (×2): qty 2

## 2020-04-01 MED ORDER — TRAZODONE HCL 50 MG PO TABS
100.0000 mg | ORAL_TABLET | Freq: Every evening | ORAL | Status: DC | PRN
  Administered 2020-04-04: 100 mg via ORAL
  Filled 2020-04-01: qty 2

## 2020-04-01 MED ORDER — APIXABAN 5 MG PO TABS
5.0000 mg | ORAL_TABLET | Freq: Two times a day (BID) | ORAL | Status: DC
  Administered 2020-04-01 – 2020-04-05 (×8): 5 mg via ORAL
  Filled 2020-04-01 (×9): qty 1

## 2020-04-01 MED ORDER — HYDROMORPHONE HCL 1 MG/ML IJ SOLN: 1.0000 mg | INTRAMUSCULAR | Status: DC

## 2020-04-01 MED ORDER — HYDROMORPHONE 1 MG/ML IV SOLN
INTRAVENOUS | Status: DC
Start: 2020-04-01 — End: 2020-04-05
  Administered 2020-04-01: 30 mg via INTRAVENOUS
  Administered 2020-04-02: 3.5 mg via INTRAVENOUS
  Administered 2020-04-02: 30 mg via INTRAVENOUS
  Administered 2020-04-02: 7.5 mg via INTRAVENOUS
  Administered 2020-04-02: 3.5 mg via INTRAVENOUS
  Administered 2020-04-02: 5 mg via INTRAVENOUS
  Administered 2020-04-02: 6.7 mg via INTRAVENOUS
  Administered 2020-04-03: 7.5 mg via INTRAVENOUS
  Administered 2020-04-03: 1.5 mg via INTRAVENOUS
  Administered 2020-04-03: 4 mg via INTRAVENOUS
  Administered 2020-04-03: 5.5 mg via INTRAVENOUS
  Administered 2020-04-03: 2 mg via INTRAVENOUS
  Administered 2020-04-03: 3 mg via INTRAVENOUS
  Administered 2020-04-03: 30 mg via INTRAVENOUS
  Administered 2020-04-04: 9.5 mg via INTRAVENOUS
  Administered 2020-04-04: 30 mg via INTRAVENOUS
  Administered 2020-04-04: 6.5 mg via INTRAVENOUS
  Administered 2020-04-04: 8 mg via INTRAVENOUS
  Administered 2020-04-04: 30 mg via INTRAVENOUS
  Administered 2020-04-04: 5.5 mg via INTRAVENOUS
  Administered 2020-04-04: 5 mg via INTRAVENOUS
  Administered 2020-04-05: 7 mg via INTRAVENOUS
  Administered 2020-04-05: 9.5 mg via INTRAVENOUS
  Filled 2020-04-01 (×5): qty 30

## 2020-04-01 NOTE — ED Provider Notes (Addendum)
[facility]-EMERGENCY DEPT Provider Note   CSN: [id number] Arrival date & time: 04/01/20  0443     History Chief Complaint  Patient presents with  . Sickle Cell Pain Crisis    Tariq X Skoog is a 29 y.o. male with a past medical history of sickle cell anemia, HIV on antiretroviral, PE on Eliquis presenting to the ED with a chief complaint of sickle cell pain crisis.  Reports pain all over his body.  This includes chest pain, back pain and pain in his extremities.  He denies any shortness of breath but does report congestion.  No fevers, vomiting, abdominal pain, diarrhea or cough.  States that he has hypoxia when he is suffering from a sickle cell pain crisis.  Last dose of home medication was earlier last night.  This has not given him any relief.  Does not wear supplemental oxygen at baseline.  HPI     Past Medical History:  Diagnosis Date  . Anxiety   . HIV (human immunodeficiency virus infection) (HCC)   . Proteinuria   . Sickle cell crisis (HCC)   . 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HCC)   . Vitamin D deficiency 10/2018    Patient Active Problem List   Diagnosis Date Noted  . 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/18/2020  . 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 Itching of male genitalia 02/22/2020  . Abnormal penile discharge, without blood 02/22/2020  . Acute chest syndrome (HCC) 02/19/2020  . Leukocytosis 01/02/2020  . Anxiety 11/27/2019  . Proteinuria 11/27/2019  . Scleral icterus   . Chronic, continuous use of opioids 08/15/2019  . Seasonal allergies 08/15/2019  . Hypoxia   . Chest congestion   . Sickle cell anemia with crisis (HCC) 08/04/2019  . Other chronic pain 08/04/2019  . History of pulmonary embolus (PE) 08/04/2019  . Acute pulmonary embolism (HCC) 02/10/2019  . Acute chest syndrome due to sickle cell crisis (HCC) 02/10/2019  . Sickle cell crisis (HCC) 01/17/2019  . Heart murmur 02/03/2017  . Sickle cell disease with crisis (HCC) 02/02/2017   . 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 Sickle cell pain crisis (HCC) 02/02/2017  . 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HCC) 12/20/2016  . Vitamin D deficiency 08/14/2016  . High risk medication use 09/27/2014  . Generalized anxiety disorder 05/19/2014  . GERD (gastroesophageal reflux disease) 05/19/2014  . HIV (human immunodeficiency virus infection) (HCC) 05/23/2012    Past Surgical History:  Procedure Laterality Date  . IR IMAGING GUIDED PORT INSERTION  08/29/2019       Family History  Problem Relation Age of Onset  . Sickle cell trait Mother   . Sickle cell trait Father   . Birth defects Maternal Grandmother   . Birth defects Paternal Grandmother     Social History   Tobacco Use  . Smoking status: Never Smoker  . Smokeless tobacco: Never Used  Vaping Use  . Vaping Use: Some days  . Substances: THC  Substance Use Topics  . Alcohol use: No  . Drug use: Yes    Types: Marijuana    Home Medications Prior to Admission medications   Medication Sig Start Date End Date Taking? Authorizing Provider  abacavir-dolutegravir-lamiVUDine (TRIUMEQ) 600-50-300 MG tablet Take 1 tablet by mouth at bedtime.    Yes [provider]  apixaban (ELIQUIS) 5 MG TABS tablet Take 1 tablet (5 mg total) by mouth 2 (two) times daily. 08/21/19  Yes Massie Maroon, [name]  hydroxyurea (HYDREA) 500 MG capsule Take 4 capsules (  2,000 mg total) by mouth at bedtime. 12/08/19  Yes [name], [name]  Multiple Vitamin (MULTIVITAMIN WITH MINERALS) TABS tablet Take 1 tablet by mouth daily with breakfast.    Yes [provider]  naloxone (NARCAN) 0.4 MG/ML injection As needed. Patient taking differently: Place 0.4 mg into the nose as needed (overdose). 11/28/18  Yes [name], [name]  oxyCODONE-acetaminophen (PERCOCET) 10-325 MG tablet Take 1 tablet by mouth every 4 (four) hours as needed for up to 15 days for pain. 03/26/20 04/10/20 Yes [name], [name]  traZODone (DESYREL) 100 MG tablet  Take 1 tablet (100 mg total) by mouth at bedtime as needed for sleep. 03/26/20  Yes [name], [name]  Vitamin D, Ergocalciferol, (DRISDOL) 1.25 MG (50000 UT) CAPS capsule Take 1 capsule (50,000 Units total) by mouth every 7 (seven) days. 11/01/18   [name], [name]    Allergies    Tape and Ketorolac  Review of Systems   Review of Systems  Constitutional: Negative for appetite change, chills and fever.  HENT: Positive for congestion. Negative for ear pain, rhinorrhea, sneezing and sore throat.   Eyes: Negative for photophobia and visual disturbance.  Respiratory: Negative for cough, chest tightness, shortness of breath and wheezing.   Cardiovascular: Negative for chest pain and palpitations.  Gastrointestinal: Negative for abdominal pain, blood in stool, constipation, diarrhea, nausea and vomiting.  Genitourinary: Negative for dysuria, hematuria and urgency.  Musculoskeletal: Positive for myalgias.  Skin: Negative for rash.  Neurological: Negative for dizziness, weakness and light-headedness.    Physical Exam Updated Vital Signs BP 96/76   Pulse 81   Temp 98.9 F (37.2 C) (Oral)   Resp 17   Ht 6\' 5"  (1.956 m)   Wt 68 kg   SpO2 98%   BMI 17.79 kg/m   Physical Exam Vitals and nursing note reviewed.  Constitutional:      General: He is not in acute distress.    Appearance: He is well-developed and well-nourished.     Comments: On 2 L of oxygen via nasal cannula.  Speaking complete sentences without difficulty.  HENT:     Head: Normocephalic and atraumatic.     Nose: Nose normal.  Eyes:     General: No scleral icterus.       Left eye: No discharge.     Extraocular Movements: EOM normal.     Conjunctiva/sclera: Conjunctivae normal.  Cardiovascular:     Rate and Rhythm: Normal rate and regular rhythm.     Pulses: Intact distal pulses.     Heart sounds: Normal heart sounds. No murmur heard. No friction rub. No gallop.   Pulmonary:     Effort: Pulmonary effort  is normal. No respiratory distress.     Breath sounds: Normal breath sounds.  Abdominal:     General: Bowel sounds are normal. There is no distension.     Palpations: Abdomen is soft.     Tenderness: There is no abdominal tenderness. There is no guarding.  Musculoskeletal:        General: No edema. Normal range of motion.     Cervical back: Normal range of motion and neck supple.  Skin:    General: Skin is warm and dry.     Findings: No rash.  Neurological:     Mental Status: He is alert.     Motor: No abnormal muscle tone.     Coordination: Coordination normal.  Psychiatric:        Mood  and Affect: Mood and affect normal.     ED Results / Procedures / Treatments   Labs (all labs ordered are listed, but only abnormal results are displayed) Labs Reviewed  COMPREHENSIVE METABOLIC PANEL - Abnormal; Notable for the following components:      Result Value   Sodium 134 (*)    Calcium 8.5 (*)    Total Protein 8.7 (*)    AST 54 (*)    Total Bilirubin 9.7 (*)    All other components within normal limits  CBC WITH DIFFERENTIAL/PLATELET - Abnormal; Notable for the following components:   WBC 13.1 (*)    RBC 2.11 (*)    Hemoglobin 7.4 (*)    HCT 20.4 (*)    MCH 35.1 (*)    MCHC 36.3 (*)    RDW 27.4 (*)    nRBC 9.9 (*)    Lymphs Abs 6.6 (*)    Monocytes Absolute 1.8 (*)    All other components within normal limits  RETICULOCYTES - Abnormal; Notable for the following components:   Retic Ct Pct 21.0 (*)    RBC. 2.10 (*)    Retic Count, Absolute 441.0 (*)    All other components within normal limits    EKG None  Radiology DG Chest 2 View  Result Date: 04/01/2020 CLINICAL DATA:  29 year old male with hypoxia, productive cough for 2 days. Sickle cell disease. HIV. EXAM: CHEST - 2 VIEW COMPARISON:  Chest radiographs 03/17/2020 and earlier. FINDINGS: Stable right chest power port. Stable lung volumes and mediastinal contours. The chronic bilateral increased pulmonary  interstitial markings, stable since last month. Small peripheral left upper lobe lung nodule appears stable since August. No superimposed pneumothorax, pleural effusion or acute pulmonary opacity. Visualized tracheal air column is within normal limits. Negative visible bowel gas pattern. No acute osseous abnormality identified. IMPRESSION: Stable chronic interstitial lung changes. No acute cardiopulmonary abnormality. Electronically Signed   By: Odessa Fleming M.D.   On: 04/01/2020 08:05    Procedures Procedures (including critical care time)  Medications Ordered in ED Medications  dextrose 5 %-0.45 % sodium chloride infusion ( Intravenous New Bag/Given 04/01/20 0901)  HYDROmorphone (DILAUDID) injection 1 mg (1 mg Intravenous Not Given 04/01/20 1000)  sodium chloride (PF) 0.9 % injection (has no administration in time range)  HYDROmorphone (DILAUDID) injection 1 mg (1 mg Intravenous Given 04/01/20 0848)  HYDROmorphone (DILAUDID) injection 1 mg (1 mg Intravenous Given 04/01/20 1002)  iohexol (OMNIPAQUE) 350 MG/ML injection 100 mL (80 mLs Intravenous Contrast Given 04/01/20 1144)    ED Course  I have reviewed the triage vital signs and the nursing notes.  Pertinent labs & imaging results that were available during my care of the patient were reviewed by me and considered in my medical decision making (see chart for details).  Clinical Course as of 04/01/20 1310  Mon Apr 01, 2020  0240 DG Chest 2 View Shows stable chronic changes without acute abnormality. [HK]  0815 Chart review shows patient has required supplemental oxygen with prior crises and does use this at night. [HK]  0919 Total Bilirubin(!): 9.7 Slightly higher than baseline but patient denies any abdominal pain, no right upper quadrant or epigastric tenderness. [HK]  1035 Hemoglobin(!): 7.4 [HK]  1035 SpO2: 91 % Patient on 3 L of oxygen via nasal cannula. [HK]  1037 CT Angio Chest PE W/Cm &/Or Wo Cm Shows no evidence of PE, no acute  pulmonary findings.  He does have significant scarring and inflammatory appearing lymph nodes  in the bilateral chest. [HK]    Clinical Course User Index [HK] Dietrich PatesKhatri, Marleni Gallardo, PA-C   MDM Rules/Calculators/A&P                          29 year old male with a past medical history of sickle cell anemia, HIV on antiretrovirals, PE on Eliquis presenting to the ED with a chief complaint of sickle cell pain crisis.  Reports pain all over her body which is typical of her pain crisis.  States that he has hypoxia when he is suffering from a sickle cell pain crisis.  He was told to wear supplemental oxygen at night.  Minimal improvement noted with home medications.  On exam patient without any signs of respiratory distress, tachypnea or tachycardia.  He denies chest pain or shortness of breath.  Does report a history of acute chest syndrome.  He was initially on 2 L of oxygen via nasal cannula which was subsequently increased to 3 L.  CBC with leukocytosis, hemoglobin at baseline.  Does have significant elevation in T bili but denying any abdominal pain and abdomen is nontender.  Chest x-ray without any acute findings, due to patient's history of PE and current hypoxia without known cause, CT angio was done.  This showed no evidence of PE or other acute findings.  Question whether this could be due to decreased respiratory drive secondary to opioid use.  He tells me that this is typical when he has his pain crises.  We are unable to control his pain here.  He does have mild hypotension with systolic pressures in the 90s.  Patient will need to be admitted for ongoing management of sickle cell pain crisis and hypoxia to assess for improvement.  After speaking to sickle cell specialist, Armeniahina Hollis, she states that patient is chronically on supplemental oxygen at home.  She will admit for ongoing management.  Portions of this note were generated with Scientist, clinical (histocompatibility and immunogenetics)Dragon dictation software. Dictation errors may occur despite best  attempts at proofreading.  Final Clinical Impression(s) / ED Diagnoses Final diagnoses:  Sickle cell pain crisis [facility](HCC)  Hypoxia    Rx / DC Orders ED Discharge Orders    None          Dietrich PatesKhatri, Arshia Spellman, PA-C 04/01/20 1510    Alvira MondaySchlossman, Erin, MD 04/03/20 [phone]

## 2020-04-01 NOTE — ED Notes (Signed)
Writer spoke with pt's husband, Ethelene Browns. When asked if this behavior is typical for her, husband stated, "It's typical and unusual at the same time."

## 2020-04-01 NOTE — H&P (Signed)
H&P  Patient Demographics:  Martin [name], is a 29 y.o. male  MRN: [id number]   DOB - Sep 15, 1990  Admit Date - 04/01/2020  Outpatient Primary MD for the patient is [name], [name]  Chief Complaint  Patient presents with  . Sickle Cell Pain Crisis      HPI:   Martin [name]  is a 29 y.o. male with a medical history significant for sickle cell disease, chronic pain syndrome, opiate dependence and tolerance, chronic hypoxia on home oxygen, history of HIV disease, history of PE on Eliquis, and history of anemia of chronic disease presents to the emergency department with complaints of generalized pain over the past 3-4 days.  Pain is consistent with his typical sickle cell pain crisis.  He attributes pain crisis to changes in weather and increased stressors at home.  He has been taking Percocet 10-325 mg every 4-6 hours without sustained relief.  Current pain intensity is 10/10 characterized as constant and throbbing.  Pain persists right IV Dilaudid, and IV fluids.  Patient complains of some chest pain, congestion, and had an oxygen requirement of 3 L.  Chest x-ray showed no acute cardiopulmonary process.  Also, CT of chest was negative for pulmonary embolus.  Patient states that he has been consistently taking Eliquis. Patient endorses sinus pressure and productive cough over the past several days. He has not attempted any OTC interventions to alleviate this problem. Patient denies any shortness of breath, dizziness, paresthesias, nausea, vomiting, or diarrhea.   ER Course:  Vital signs show: BP 96/68   Pulse 91   Temp 98.9 F (37.2 C) (Oral)   Resp 20   Ht 6\' 5"  (1.956 m)   Wt 68 kg   SpO2 95%   BMI 17.79 kg/m .  Chest x-ray shows stable chronic interstitial lung changes.  No acute cardiopulmonary abnormality.  CT angio shows no evidence of pulmonary embolism, no acute pulmonary findings.  Scarring has developed in the peripheral left upper lobe and the site of abnormal 2020  lung opacity.  Elsewhere stable lower lobe predominant scarring.  Inflammatory appearing lymph nodes in the bilateral chest of mildly increased since last year.  Of the sequela of sickle cell disease including calcified splenic atrophy present.  WBCs 13.1.  Hemoglobin 7.4, consistent with patient's baseline.  AST 54, ALT 29, and total bilirubin 9.7.  Pain persists despite IV Dilaudid and IV fluids.  Admit to MedSurg for further management of sickle cell pain crisis.    Review of systems:  In addition to the HPI above, patient reports No fever or chills No Headache, No changes with vision or hearing No problems swallowing food or liquids No chest pain, cough or shortness of breath No abdominal pain, No nausea or vomiting, Bowel movements are regular No blood in stool or urine No dysuria No new skin rashes or bruises No new joints pains-aches No new weakness, tingling, numbness in any extremity No recent weight gain or loss No polyuria, polydypsia or polyphagia No significant Mental Stressors  A full 10 point Review of Systems was done, except as stated above, all other Review of Systems were negative.  With Past History of the following :   Past Medical History:  Diagnosis Date  . Anxiety   . HIV (human immunodeficiency virus infection) (HCC)   . Proteinuria   . Sickle cell crisis (HCC)   . 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HCC)   . Vitamin D deficiency 10/2018      Past Surgical History:  Procedure Laterality Date  . IR IMAGING GUIDED PORT INSERTION  08/29/2019     Social History:   Social History   Tobacco Use  . Smoking status: Never Smoker  . Smokeless tobacco: Never Used  Substance Use Topics  . Alcohol use: No     Lives - At home   Family History :   Family History  Problem Relation Age of Onset  . Sickle cell trait Mother   . Sickle cell trait Father   . Birth defects Maternal Grandmother   . Birth defects Paternal Grandmother      Home Medications:   Prior to  Admission medications   Medication Sig Start Date End Date Taking? Authorizing Provider  abacavir-dolutegravir-lamiVUDine (TRIUMEQ) 600-50-300 MG tablet Take 1 tablet by mouth at bedtime.    Yes [provider]  apixaban (ELIQUIS) 5 MG TABS tablet Take 1 tablet (5 mg total) by mouth 2 (two) times daily. 08/21/19  Yes Massie Maroon, [name]  hydroxyurea (HYDREA) 500 MG capsule Take 4 capsules (2,000 mg total) by mouth at bedtime. 12/08/19  Yes [name], [name]  Multiple Vitamin (MULTIVITAMIN WITH MINERALS) TABS tablet Take 1 tablet by mouth daily with breakfast.    Yes [provider]  naloxone (NARCAN) 0.4 MG/ML injection As needed. Patient taking differently: Place 0.4 mg into the nose as needed (overdose). 11/28/18  Yes [name], [name]  oxyCODONE-acetaminophen (PERCOCET) 10-325 MG tablet Take 1 tablet by mouth every 4 (four) hours as needed for up to 15 days for pain. 03/26/20 04/10/20 Yes [name], [name]  traZODone (DESYREL) 100 MG tablet Take 1 tablet (100 mg total) by mouth at bedtime as needed for sleep. 03/26/20  Yes [name], [name]  Vitamin D, Ergocalciferol, (DRISDOL) 1.25 MG (50000 UT) CAPS capsule Take 1 capsule (50,000 Units total) by mouth every 7 (seven) days. 11/01/18   [name], [name]     Allergies:   Allergies  Allergen Reactions  . Tape Rash and Other (See Comments)    PLEASE DO NOT USE THE CLEAR, THICK, "PLASTIC" TAPE- only paper tape is tolerated   . Ketorolac Swelling and Other (See Comments)    Patient reports facial edema and left arm edema after administration.     Physical Exam:   Vitals:   Vitals:   04/01/20 1500 04/01/20 1515  BP: 94/72 96/66  Pulse: 94 92  Resp: (!) 24 (!) 31  Temp:    SpO2: 96% 94%    Physical Exam: Constitutional: Patient appears well-developed and well-nourished. Not in obvious distress. HENT: Normocephalic, atraumatic, External right and left ear normal. Oropharynx is clear and moist.   Eyes: Conjunctivae and EOM are normal. PERRLA, no scleral icterus. Neck: Normal ROM. Neck supple. No JVD. No tracheal deviation. No thyromegaly. CVS: RRR, S1/S2 +, no murmurs, no gallops, no carotid bruit.  Pulmonary: Effort and breath sounds normal, no stridor, rhonchi, wheezes, rales.  Abdominal: Soft. BS +, no distension, tenderness, rebound or guarding.  Musculoskeletal: Normal range of motion. No edema and no tenderness.  Lymphadenopathy: No lymphadenopathy noted, cervical, inguinal or axillary Neuro: Alert. Normal reflexes, muscle tone coordination. No cranial nerve deficit. Skin: Skin is warm and dry. No rash noted. Not diaphoretic. No erythema. No pallor. Psychiatric: Normal mood and affect. Behavior, judgment, thought content normal.   Data Review:   CBC Recent Labs  Lab 04/01/20 0900  WBC 13.1*  HGB 7.4*  HCT 20.4*  PLT 289  MCV 96.7  MCH  35.1*  MCHC 36.3*  RDW 27.4*  LYMPHSABS 6.6*  MONOABS 1.8*  EOSABS 0.1  BASOSABS 0.0   ------------------------------------------------------------------------------------------------------------------  Chemistries  Recent Labs  Lab 04/01/20 0900  NA 134*  K 4.2  CL 102  CO2 24  GLUCOSE 96  BUN 10  CREATININE 0.71  CALCIUM 8.5*  AST 54*  ALT 29  ALKPHOS 54  BILITOT 9.7*   ------------------------------------------------------------------------------------------------------------------ estimated creatinine clearance is 129.9 mL/min (by C-G formula based on SCr of 0.71 mg/dL). ------------------------------------------------------------------------------------------------------------------ No results for input(s): TSH, T4TOTAL, T3FREE, THYROIDAB in the last 72 hours.  Invalid input(s): FREET3  Coagulation profile No results for input(s): INR, PROTIME in the last 168 hours. ------------------------------------------------------------------------------------------------------------------- No results for input(s):  DDIMER in the last 72 hours. -------------------------------------------------------------------------------------------------------------------  Cardiac Enzymes No results for input(s): CKMB, TROPONINI, MYOGLOBIN in the last 168 hours.  Invalid input(s): CK ------------------------------------------------------------------------------------------------------------------ No results found for: BNP  ---------------------------------------------------------------------------------------------------------------  Urinalysis    Component Value Date/Time   COLORURINE YELLOW 02/19/2020 1510   APPEARANCEUR CLEAR 02/19/2020 1510   LABSPEC 1.011 02/19/2020 1510   PHURINE 6.0 02/19/2020 1510   GLUCOSEU NEGATIVE 02/19/2020 1510   HGBUR NEGATIVE 02/19/2020 1510   BILIRUBINUR NEGATIVE 02/19/2020 1510   BILIRUBINUR neg 11/27/2019 1149   KETONESUR NEGATIVE 02/19/2020 1510   PROTEINUR NEGATIVE 02/19/2020 1510   UROBILINOGEN >=8.0 (A) 11/27/2019 1149   UROBILINOGEN 4.0 (H) 05/21/2009 1944   NITRITE NEGATIVE 02/19/2020 1510   LEUKOCYTESUR TRACE (A) 02/19/2020 1510    ----------------------------------------------------------------------------------------------------------------   Imaging Results:    DG Chest 2 View  Result Date: 04/01/2020 CLINICAL DATA:  29 year old male with hypoxia, productive cough for 2 days. Sickle cell disease. HIV. EXAM: CHEST - 2 VIEW COMPARISON:  Chest radiographs 03/17/2020 and earlier. FINDINGS: Stable right chest power port. Stable lung volumes and mediastinal contours. The chronic bilateral increased pulmonary interstitial markings, stable since last month. Small peripheral left upper lobe lung nodule appears stable since August. No superimposed pneumothorax, pleural effusion or acute pulmonary opacity. Visualized tracheal air column is within normal limits. Negative visible bowel gas pattern. No acute osseous abnormality identified. IMPRESSION: Stable chronic  interstitial lung changes. No acute cardiopulmonary abnormality. Electronically Signed   By: Odessa Fleming M.D.   On: 04/01/2020 08:05     Assessment & Plan:  Principal Problem:   Sickle cell pain crisis (HCC) Active Problems:   Generalized anxiety disorder   HIV (human immunodeficiency virus infection) (HCC)   History of pulmonary embolus (PE)   Chronic, continuous use of opioids   Leukocytosis  Sickle cell disease with pain crisis: Continue IV fluids, D5 0.45% saline at 100 mL/h Hold IV Toradol due to listed allergy Initiate IV Dilaudid PCA with settings of 0.5 mg, 10-minute lockout, and 3 mg/h. Restart oral home medications Monitor vital signs closely, reevaluate pain scale regularly, and supplemental oxygen as needed.  Sickle cell anemia: Hemoglobin is 7.4, consistent with patient's baseline.  There is no clinical indication for blood transfusion at this time.  Continue folic acid 1 mg daily.  Continue hydroxyurea 2000 mg daily. Follow labs in AM.  Leukocytosis: WBCs 13.1, mild leukocytosis.  More than likely secondary to sickle cell pain crisis.  Continue to follow closely without antibiotics.  CBC in a.m.  Chronic pain syndrome: Continue home medications  History of DVT/PE: Continue Eliquis 5 mg twice daily  History of HIV: Stable.  Undetectable.  Continue antivirals.  Followed by ID as an outpatient.    DVT Prophylaxis: Eliquis and SCDs  AM Labs  Ordered, also please review Full Orders  Family Communication: Admission, patient's condition and plan of care including tests being ordered have been discussed with the patient who indicate understanding and agree with the plan and Code Status.  Code Status: Full Code  Consults called: None    Admission status: Inpatient    Time spent in minutes : 35 minutes  Nolon NationsLachina Moore Concepcion Kirkpatrick  APRN, [name], [name] Patient Care [facility] [address]  [place], [place] [address] [phone]  04/01/2020 at  3:47 PM

## 2020-04-01 NOTE — ED Notes (Signed)
Patient placed on 2L O2 

## 2020-04-01 NOTE — ED Notes (Signed)
Patient falling asleep during triage

## 2020-04-01 NOTE — ED Triage Notes (Signed)
Patient arrived with complaints of generalized sickle cell pain that started today. Last dose of home medication today at 2am

## 2020-04-01 NOTE — ED Notes (Signed)
Report given to [place], [place] at [facility]

## 2020-04-01 NOTE — ED Notes (Signed)
Pt's supplemental O2 increased from 2L to 3L to maintain sat level greater than 94%. Provider notified.

## 2020-04-01 NOTE — ED Notes (Signed)
RN notified about vitals 

## 2020-04-02 DIAGNOSIS — F411 Generalized anxiety disorder: Secondary | ICD-10-CM | POA: Diagnosis present

## 2020-04-02 DIAGNOSIS — Z888 Allergy status to other drugs, medicaments and biological substances status: Secondary | ICD-10-CM | POA: Diagnosis not present

## 2020-04-02 DIAGNOSIS — D5701 Hb-SS disease with acute chest syndrome: Secondary | ICD-10-CM | POA: Diagnosis present

## 2020-04-02 DIAGNOSIS — G894 Chronic pain syndrome: Secondary | ICD-10-CM | POA: Diagnosis present

## 2020-04-02 DIAGNOSIS — K219 Gastro-esophageal reflux disease without esophagitis: Secondary | ICD-10-CM | POA: Diagnosis present

## 2020-04-02 DIAGNOSIS — Z9981 Dependence on supplemental oxygen: Secondary | ICD-10-CM | POA: Diagnosis not present

## 2020-04-02 DIAGNOSIS — D5700 Hb-SS disease with crisis, unspecified: Principal | ICD-10-CM

## 2020-04-02 DIAGNOSIS — J129 Viral pneumonia, unspecified: Secondary | ICD-10-CM | POA: Diagnosis present

## 2020-04-02 DIAGNOSIS — Z7901 Long term (current) use of anticoagulants: Secondary | ICD-10-CM | POA: Diagnosis not present

## 2020-04-02 DIAGNOSIS — D730 Hyposplenism: Secondary | ICD-10-CM | POA: Diagnosis present

## 2020-04-02 DIAGNOSIS — J9621 Acute and chronic respiratory failure with hypoxia: Secondary | ICD-10-CM | POA: Diagnosis present

## 2020-04-02 DIAGNOSIS — D638 Anemia in other chronic diseases classified elsewhere: Secondary | ICD-10-CM | POA: Diagnosis present

## 2020-04-02 DIAGNOSIS — J0100 Acute maxillary sinusitis, unspecified: Secondary | ICD-10-CM | POA: Diagnosis present

## 2020-04-02 DIAGNOSIS — Z91048 Other nonmedicinal substance allergy status: Secondary | ICD-10-CM | POA: Diagnosis not present

## 2020-04-02 DIAGNOSIS — Z79891 Long term (current) use of opiate analgesic: Secondary | ICD-10-CM | POA: Diagnosis not present

## 2020-04-02 DIAGNOSIS — B20 Human immunodeficiency virus [HIV] disease: Secondary | ICD-10-CM | POA: Diagnosis present

## 2020-04-02 DIAGNOSIS — Z681 Body mass index (BMI) 19 or less, adult: Secondary | ICD-10-CM | POA: Diagnosis not present

## 2020-04-02 DIAGNOSIS — Z832 Family history of diseases of the blood and blood-forming organs and certain disorders involving the immune mechanism: Secondary | ICD-10-CM | POA: Diagnosis not present

## 2020-04-02 DIAGNOSIS — F1190 Opioid use, unspecified, uncomplicated: Secondary | ICD-10-CM | POA: Diagnosis not present

## 2020-04-02 DIAGNOSIS — B9781 Human metapneumovirus as the cause of diseases classified elsewhere: Secondary | ICD-10-CM | POA: Diagnosis present

## 2020-04-02 DIAGNOSIS — Z86711 Personal history of pulmonary embolism: Secondary | ICD-10-CM | POA: Diagnosis not present

## 2020-04-02 DIAGNOSIS — Z20822 Contact with and (suspected) exposure to covid-19: Secondary | ICD-10-CM | POA: Diagnosis present

## 2020-04-02 DIAGNOSIS — R0902 Hypoxemia: Secondary | ICD-10-CM | POA: Diagnosis present

## 2020-04-02 DIAGNOSIS — R636 Underweight: Secondary | ICD-10-CM | POA: Diagnosis present

## 2020-04-02 DIAGNOSIS — J0110 Acute frontal sinusitis, unspecified: Secondary | ICD-10-CM | POA: Diagnosis present

## 2020-04-02 DIAGNOSIS — E559 Vitamin D deficiency, unspecified: Secondary | ICD-10-CM | POA: Diagnosis present

## 2020-04-02 LAB — CBC
HCT: 20 % — ABNORMAL LOW (ref 39.0–52.0)
Hemoglobin: 7.5 g/dL — ABNORMAL LOW (ref 13.0–17.0)
MCH: 35.9 pg — ABNORMAL HIGH (ref 26.0–34.0)
MCHC: 37.5 g/dL — ABNORMAL HIGH (ref 30.0–36.0)
MCV: 95.7 fL (ref 80.0–100.0)
Platelets: 285 10*3/uL (ref 150–400)
RBC: 2.09 MIL/uL — ABNORMAL LOW (ref 4.22–5.81)
RDW: 25.7 % — ABNORMAL HIGH (ref 11.5–15.5)
WBC: 12.9 10*3/uL — ABNORMAL HIGH (ref 4.0–10.5)
nRBC: 7.9 % — ABNORMAL HIGH (ref 0.0–0.2)

## 2020-04-02 LAB — BASIC METABOLIC PANEL
Anion gap: 9 (ref 5–15)
BUN: 9 mg/dL (ref 6–20)
CO2: 25 mmol/L (ref 22–32)
Calcium: 8.4 mg/dL — ABNORMAL LOW (ref 8.9–10.3)
Chloride: 102 mmol/L (ref 98–111)
Creatinine, Ser: 0.67 mg/dL (ref 0.61–1.24)
GFR, Estimated: >60 mL/min (ref >60)
Glucose, Bld: 101 mg/dL — ABNORMAL HIGH (ref 70–99)
Potassium: 4.1 mmol/L (ref 3.5–5.1)
Sodium: 136 mmol/L (ref 135–145)

## 2020-04-02 LAB — URINE CULTURE: Culture: NO GROWTH

## 2020-04-02 MED ORDER — AMOXICILLIN-POT CLAVULANATE 875-125 MG PO TABS
1.0000 | ORAL_TABLET | Freq: Two times a day (BID) | ORAL | Status: DC
  Administered 2020-04-02 – 2020-04-03 (×3): 1 via ORAL
  Filled 2020-04-02 (×3): qty 1

## 2020-04-02 MED ORDER — ALBUTEROL SULFATE (2.5 MG/3ML) 0.083% IN NEBU
2.5000 mg | INHALATION_SOLUTION | RESPIRATORY_TRACT | Status: DC | PRN
  Administered 2020-04-02: 2.5 mg via RESPIRATORY_TRACT
  Filled 2020-04-02: qty 3

## 2020-04-02 MED ORDER — FLEET ENEMA 7-19 GM/118ML RE ENEM
1.0000 | ENEMA | Freq: Every day | RECTAL | Status: DC | PRN
  Administered 2020-04-02 – 2020-04-05 (×4): 1 via RECTAL
  Filled 2020-04-02 (×5): qty 1

## 2020-04-02 MED ORDER — PROCHLORPERAZINE EDISYLATE 10 MG/2ML IJ SOLN
10.0000 mg | Freq: Four times a day (QID) | INTRAMUSCULAR | Status: DC | PRN
  Administered 2020-04-02: 10 mg via INTRAVENOUS
  Filled 2020-04-02: qty 2

## 2020-04-02 MED ORDER — SODIUM CHLORIDE 0.9 % IV SOLN
INTRAVENOUS | Status: DC | PRN
  Administered 2020-04-03: 1000 mL via INTRAVENOUS

## 2020-04-02 MED ORDER — CHLORHEXIDINE GLUCONATE CLOTH 2 % EX PADS
6.0000 | MEDICATED_PAD | Freq: Every day | CUTANEOUS | Status: DC
  Administered 2020-04-02 – 2020-04-05 (×4): 6 via TOPICAL

## 2020-04-02 MED ORDER — GUAIFENESIN-DM 100-10 MG/5ML PO SYRP
5.0000 mL | ORAL_SOLUTION | ORAL | Status: DC | PRN
  Administered 2020-04-02 – 2020-04-04 (×4): 5 mL via ORAL
  Filled 2020-04-02 (×4): qty 10

## 2020-04-02 NOTE — Progress Notes (Signed)
Subjective: Martin [name] is a 29 year old male with a medical history significant for sickle cell disease, chronic pain syndrome, opiate dependence and tolerance, HIV disease, history of pulmonary embolism on Eliquis, and history of anemia of chronic disease was admitted for sickle cell pain crisis.  Today, patient is complaining of sinus pressure, sinus pain, and nasal congestion.  Patient also endorses headache.  Patient has had the symptoms overnight.  He has a productive cough as well.  He continues to complain of sickle cell pain primarily to low back and lower extremities.  Pain is rated 10/10. He denies any shortness of breath, urinary symptoms, nausea, vomiting, or diarrhea.  Patient has been afebrile and oxygen saturation is around 91% RA.  Objective:  Vital signs in last 24 hours:  Vitals:   04/02/20 0754 04/02/20 0949 04/02/20 1021 04/02/20 1509  BP:  108/74    Pulse:  (!) 102    Resp: 12 15 15 16   Temp:  98.5 F (36.9 C)    TempSrc:  Oral    SpO2: 93% (!) 87% 91% 91%  Weight:      Height:        Intake/Output from previous day:   Intake/Output Summary (Last 24 hours) at 04/02/2020 1517 Last data filed at 04/02/2020 0949 Gross per 24 hour  Intake 240.14 ml  Output 1375 ml  Net -1134.86 ml   Physical Exam Constitutional:      Appearance: Normal appearance.  HENT:     Nose:     Right Sinus: Frontal sinus tenderness present.     Left Sinus: Maxillary sinus tenderness and frontal sinus tenderness present.  Eyes:     Pupils: Pupils are equal, round, and reactive to light.  Cardiovascular:     Rate and Rhythm: Normal rate and regular rhythm.     Pulses: Normal pulses.  Pulmonary:     Effort: No respiratory distress.     Breath sounds: Rhonchi present.  Abdominal:     General: Bowel sounds are normal.  Musculoskeletal:        General: Normal range of motion.  Skin:    General: Skin is warm.  Neurological:     General: No focal deficit present.      Mental Status: He is alert. Mental status is at baseline.  Psychiatric:        Mood and Affect: Mood normal.        Thought Content: Thought content normal.        Judgment: Judgment normal.   \  Lab Results:  Basic Metabolic Panel:    Component Value Date/Time   NA 136 04/02/2020 0518   NA 136 10/17/2018 1433   K 4.1 04/02/2020 0518   CL 102 04/02/2020 0518   CO2 25 04/02/2020 0518   BUN 9 04/02/2020 0518   BUN 6 10/17/2018 1433   CREATININE 0.67 04/02/2020 0518   GLUCOSE 101 (H) 04/02/2020 0518   CALCIUM 8.4 (L) 04/02/2020 0518   CBC:    Component Value Date/Time   WBC 12.9 (H) 04/02/2020 0518   HGB 7.5 (L) 04/02/2020 0518   HGB 9.6 (L) 10/17/2018 1433   HCT 20.0 (L) 04/02/2020 0518   HCT 26.2 (L) 10/17/2018 1433   PLT 285 04/02/2020 0518   PLT 297 10/17/2018 1433   MCV 95.7 04/02/2020 0518   MCV 108 (H) 10/17/2018 1433   NEUTROABS 4.9 04/01/2020 0900   NEUTROABS 7.0 10/17/2018 1433   LYMPHSABS 6.6 (H) 04/01/2020 0900  LYMPHSABS 4.6 (H) 10/17/2018 1433   MONOABS 1.8 (H) 04/01/2020 0900   EOSABS 0.1 04/01/2020 0900   EOSABS 0.2 10/17/2018 1433   BASOSABS 0.0 04/01/2020 0900   BASOSABS 0.1 10/17/2018 1433    Recent Results (from the past 240 hour(s))  Resp Panel by RT-PCR (Flu A&B, Covid) Nasopharyngeal Swab     Status: None   Collection Time: 04/01/20  3:14 PM   Specimen: Nasopharyngeal Swab; Nasopharyngeal(NP) swabs in vial transport medium  Result Value Ref Range Status   SARS Coronavirus 2 by RT PCR NEGATIVE NEGATIVE Final    Comment: (NOTE) SARS-CoV-2 target nucleic acids are NOT DETECTED.  The SARS-CoV-2 RNA is generally detectable in upper respiratory specimens during the acute phase of infection. The lowest concentration of SARS-CoV-2 viral copies this assay can detect is 138 copies/mL. A negative result does not preclude SARS-Cov-2 infection and should not be used as the sole basis for treatment or other patient management decisions. A negative  result may occur with  improper specimen collection/handling, submission of specimen other than nasopharyngeal swab, presence of viral mutation(s) within the areas targeted by this assay, and inadequate number of viral copies(<138 copies/mL). A negative result must be combined with clinical observations, patient history, and epidemiological information. The expected result is Negative.  Fact Sheet for Patients:  BloggerCourse.com  Fact Sheet for Healthcare Providers:  SeriousBroker.it  This test is no t yet approved or cleared by the [place] FDA and  has been authorized for detection and/or diagnosis of SARS-CoV-2 by FDA under an Emergency Use Authorization (EUA). This EUA will remain  in effect (meaning this test can be used) for the duration of the COVID-19 declaration under Section 564(b)(1) of the Act, 21 U.S.C.section 360bbb-3(b)(1), unless the authorization is terminated  or revoked sooner.       Influenza A by PCR NEGATIVE NEGATIVE Final   Influenza B by PCR NEGATIVE NEGATIVE Final    Comment: (NOTE) The Xpert Xpress SARS-CoV-2/FLU/RSV plus assay is intended as an aid in the diagnosis of influenza from Nasopharyngeal swab specimens and should not be used as a sole basis for treatment. Nasal washings and aspirates are unacceptable for Xpert Xpress SARS-CoV-2/FLU/RSV testing.  Fact Sheet for Patients: BloggerCourse.com  Fact Sheet for Healthcare Providers: SeriousBroker.it  This test is not yet approved or cleared by the [place] FDA and has been authorized for detection and/or diagnosis of SARS-CoV-2 by FDA under an Emergency Use Authorization (EUA). This EUA will remain in effect (meaning this test can be used) for the duration of the COVID-19 declaration under Section 564(b)(1) of the Act, 21 U.S.C. section 360bbb-3(b)(1), unless the authorization is  terminated or revoked.  Performed at [facility], [address], [place], [place] [address]     Studies/Results: DG Chest 2 View  Result Date: 04/01/2020 CLINICAL DATA:  29 year old male with hypoxia, productive cough for 2 days. Sickle cell disease. HIV. EXAM: CHEST - 2 VIEW COMPARISON:  Chest radiographs 03/17/2020 and earlier. FINDINGS: Stable right chest power port. Stable lung volumes and mediastinal contours. The chronic bilateral increased pulmonary interstitial markings, stable since last month. Small peripheral left upper lobe lung nodule appears stable since August. No superimposed pneumothorax, pleural effusion or acute pulmonary opacity. Visualized tracheal air column is within normal limits. Negative visible bowel gas pattern. No acute osseous abnormality identified. IMPRESSION: Stable chronic interstitial lung changes. No acute cardiopulmonary abnormality. Electronically Signed   By: Odessa Fleming M.D.   On: 04/01/2020 08:05  CT Angio Chest PE W/Cm &/Or Wo Cm  Result Date: 04/01/2020 CLINICAL DATA:  28 year old male with sickle cell pain onset today. Pain radiating to the back. EXAM: CT ANGIOGRAPHY CHEST WITH CONTRAST TECHNIQUE: Multidetector CT imaging of the chest was performed using the standard protocol during bolus administration of intravenous contrast. Multiplanar CT image reconstructions and MIPs were obtained to evaluate the vascular anatomy. CONTRAST:  76mL OMNIPAQUE IOHEXOL 350 MG/ML SOLN COMPARISON:  Chest radiographs earlier today.  CTA chest 02/10/2019. FINDINGS: Cardiovascular: Adequate contrast bolus timing in the pulmonary arterial tree. No focal filling defect identified in the pulmonary arteries to suggest acute pulmonary embolism. Restored patency to the right lower lobe branches where nonocclusive thrombus identified last year. Stable mild cardiomegaly. No pericardial effusion. Negative visible aorta. Right chest Port-A-Cath remains in place.  Mediastinum/Nodes: Smaller volume of residual thymus compared to last year. Prominent bilateral hilar lymph nodes are stable. Similar symmetric chronic axillary lymph node prominence has mildly increased since last year. But no suspicious lymphadenopathy. Lungs/Pleura: Major airways are patent. Mild subpleural scarring now at the site of left upper lobe pulmonary opacity last year. Superimposed bilateral chronic basilar predominant increased interstitial markings, mild areas of architectural distortion and mosaic attenuation are stable. No pleural effusion or acute pulmonary opacity identified. Upper Abdomen: Atrophied, calcified spleen. Negative visible liver, pancreas, adrenal glands, kidneys and bowel in the upper abdomen. Musculoskeletal: Course bone mineralization appears stable in the thorax. No acute osseous abnormality identified. Review of the MIP images confirms the above findings. IMPRESSION: 1. No evidence of acute pulmonary embolus. 2. No acute pulmonary finding. Scarring has developed in the peripheral left upper lobe at the site of abnormal 2020 lung opacity. Elsewhere stable lower lobe predominant scarring. 3. Inflammatory appearing lymph nodes in the bilateral chest have mildly increased since last year. 4. Other sequelae of sickle cell disease including calcified splenic atrophy. Electronically Signed   By: Odessa Fleming M.D.   On: 04/01/2020 12:23    Medications: Scheduled Meds: . abacavir-dolutegravir-lamiVUDine  1 tablet Oral QHS  . amoxicillin-clavulanate  1 tablet Oral Q12H  . apixaban  5 mg Oral BID  . Chlorhexidine Gluconate Cloth  6 each Topical Daily  . HYDROmorphone   Intravenous Q4H  . hydroxyurea  2,000 mg Oral QHS  . multivitamin with minerals  1 tablet Oral Q breakfast  . senna-docusate  1 tablet Oral BID   Continuous Infusions: PRN Meds:.albuterol, diphenhydrAMINE, guaiFENesin-dextromethorphan, naloxone **AND** sodium chloride flush, ondansetron (ZOFRAN) IV, polyethylene  glycol, sodium phosphate, traZODone  Consultants:  None  Procedures:  None  Antibiotics:  Augmentin  Assessment/Plan: Principal Problem:   Sickle cell pain crisis (HCC) Active Problems:   Generalized anxiety disorder   HIV (human immunodeficiency virus infection) (HCC)   History of pulmonary embolus (PE)   Chronic, continuous use of opioids   Leukocytosis  Sickle cell disease with pain crisis: Continue IV fluids at [place].  Hold IV Toradol due to listed allergy.  Continue IV Dilaudid PCA without any changes in settings on today.  Monitor vital signs very closely, reevaluate pain scale regularly, and supplemental oxygen as needed.  Sickle cell anemia: Hemoglobin is stable and consistent with patient's baseline.  There is no clinical indication for blood transfusion at this time.  Continue to follow closely.  Mild leukocytosis.  Considered to be secondary to vaso-occlusive pain crisis.  Continue to follow closely.  Patient is complaining of acute sinus pain.  Augmentin 875-125 mg every 12 hours.  Continue to follow closely.  CBC in a.m.  HIV disease: Stable.  Continue home medications.  Chronic pain syndrome: Stable.  Continue home medications.  History of PE: Continue Eliquis 5 mg twice daily  Acute sinusitis: Patient endorses frontal and maxillary sinus pain.  Augmentin 875-125 mg initiated. Flonase daily  Code Status: Full Code Family Communication: N/A Disposition Plan: Not yet ready for discharge  Abner Ardis Rennis PettyMoore Ha Shannahan  APRN, [name], [name] [facility] [address]  [place], [place] [id number] [phone]  If 5PM-8AM, please contact night-coverage.  04/02/2020, 3:17 PM  LOS: 0 days

## 2020-04-03 ENCOUNTER — Inpatient Hospital Stay (HOSPITAL_COMMUNITY): Payer: Medicaid Other

## 2020-04-03 DIAGNOSIS — J9621 Acute and chronic respiratory failure with hypoxia: Secondary | ICD-10-CM

## 2020-04-03 LAB — RESPIRATORY PANEL BY PCR

## 2020-04-03 LAB — CBC
HCT: 17.9 % — ABNORMAL LOW (ref 39.0–52.0)
Hemoglobin: 6.9 g/dL — CL (ref 13.0–17.0)
MCH: 36.3 pg — ABNORMAL HIGH (ref 26.0–34.0)
MCHC: 38.5 g/dL — ABNORMAL HIGH (ref 30.0–36.0)
MCV: 94.2 fL (ref 80.0–100.0)
Platelets: 260 10*3/uL (ref 150–400)
RBC: 1.9 MIL/uL — ABNORMAL LOW (ref 4.22–5.81)
RDW: 25.2 % — ABNORMAL HIGH (ref 11.5–15.5)
WBC: 12.8 10*3/uL — ABNORMAL HIGH (ref 4.0–10.5)
nRBC: 0.6 % — ABNORMAL HIGH (ref 0.0–0.2)

## 2020-04-03 MED ORDER — PROCHLORPERAZINE EDISYLATE 10 MG/2ML IJ SOLN: 10.0000 mg | Freq: Four times a day (QID) | INTRAMUSCULAR | Status: DC | PRN

## 2020-04-03 MED ORDER — SODIUM CHLORIDE 0.9 % IV SOLN
2.0000 g | Freq: Three times a day (TID) | INTRAVENOUS | Status: DC
  Administered 2020-04-03 – 2020-04-04 (×3): 2 g via INTRAVENOUS
  Filled 2020-04-03 (×4): qty 2

## 2020-04-03 MED ORDER — ONDANSETRON HCL 4 MG/2ML IJ SOLN
4.0000 mg | Freq: Four times a day (QID) | INTRAMUSCULAR | Status: DC | PRN
  Administered 2020-04-03 – 2020-04-05 (×4): 4 mg via INTRAVENOUS
  Filled 2020-04-03 (×4): qty 2

## 2020-04-03 MED ORDER — IPRATROPIUM-ALBUTEROL 0.5-2.5 (3) MG/3ML IN SOLN
3.0000 mL | Freq: Two times a day (BID) | RESPIRATORY_TRACT | Status: DC
  Administered 2020-04-04 – 2020-04-05 (×3): 3 mL via RESPIRATORY_TRACT
  Filled 2020-04-03 (×3): qty 3

## 2020-04-03 MED ORDER — IPRATROPIUM-ALBUTEROL 0.5-2.5 (3) MG/3ML IN SOLN
3.0000 mL | Freq: Four times a day (QID) | RESPIRATORY_TRACT | Status: DC
  Administered 2020-04-03 (×2): 3 mL via RESPIRATORY_TRACT
  Filled 2020-04-03 (×2): qty 3

## 2020-04-03 MED ORDER — ACETAMINOPHEN 325 MG PO TABS
650.0000 mg | ORAL_TABLET | Freq: Four times a day (QID) | ORAL | Status: DC | PRN
  Administered 2020-04-03 – 2020-04-04 (×2): 650 mg via ORAL
  Filled 2020-04-03 (×2): qty 2

## 2020-04-03 MED ORDER — AZITHROMYCIN 250 MG PO TABS
500.0000 mg | ORAL_TABLET | Freq: Every day | ORAL | Status: DC
  Administered 2020-04-03 – 2020-04-04 (×2): 500 mg via ORAL
  Filled 2020-04-03 (×2): qty 2

## 2020-04-03 NOTE — Progress Notes (Signed)
Pharmacy Antibiotic Note  Martin [name] is a 29 y.o. male admitted on 04/01/2020 with sickle cell crising.  Pharmacy has been consulted for cefepime dosing for possible acute chest syndrome.  Plan: Cefepime 2g IV q8h No dose adjustments anticipated, Pharmacy will sign off  Height: 6\' 3"  (190.5 cm) Weight: 69.2 kg (152 lb 8 oz) IBW/kg (Calculated) : 84.5  Temp (24hrs), Avg:98.9 F (37.2 C), Min:98.2 F (36.8 C), Max:99.8 F (37.7 C)  Recent Labs  Lab 04/01/20 0900 04/02/20 0518 04/03/20 0448  WBC 13.1* 12.9* 12.8*  CREATININE 0.71 0.67  --     Estimated Creatinine Clearance: 132.2 mL/min (by C-G formula based on SCr of 0.67 mg/dL).    Allergies  Allergen Reactions  . Tape Rash and Other (See Comments)    PLEASE DO NOT USE THE CLEAR, THICK, "PLASTIC" TAPE- only paper tape is tolerated   . Ketorolac Swelling and Other (See Comments)    Patient reports facial edema and left arm edema after administration.    Antimicrobials this admission: 12/13 Augmentin >> 12/15 12/15 Cefepime >>  Dose adjustments this admission:  Microbiology results: 12/14 Sputum: reincubating 12/13 UCx: NGF  Thank you for allowing pharmacy to be a part of this patient's care.  1/14, PharmD, BCPS Pharmacy: [phone] 04/03/2020 1:05 PM

## 2020-04-03 NOTE — Progress Notes (Signed)
During night pt had multiple time c/o N/V,        Cherylin Mylar [name] was notified. PRN medications was given.

## 2020-04-03 NOTE — Progress Notes (Signed)
Subjective: Martin [name] is a 29 year old male with a medical history significant for sickle cell disease, chronic pain syndrome, opiate dependence and tolerance, HIV disease, history of pulmonary embolism on Eliquis, and history of anemia of chronic disease was admitted for sickle cell pain crisis.  Today, patient's oxygen saturation decreased between 60-70%.  Oxygen requirement is 4 L supplemental oxygen to maintain saturation above 90%.  Chest x-ray was repeated, which shows prominent lung markings that appear to be chronic and stable without any superimposed acute abnormality.  Patient endorses productive cough and wheezing.  Also, endorses shortness of breath at rest.  Denies headache, chest pain, dizziness, urinary symptoms, nausea, vomiting, or diarrhea.  Patient continues to endorse headache and sinus pressure. Patient continues to have pain to central chest, low back, and lower extremities.  Pain intensity is 7/10, which is somewhat improved from previous. Objective:  Vital signs in last 24 hours:  Vitals:   04/03/20 0703 04/03/20 0952 04/03/20 1000 04/03/20 1022  BP:  (!) 93/52    Pulse:  86  85  Resp: 18 17  14   Temp:  98.2 F (36.8 C)    TempSrc:  Oral    SpO2: 94% (!) 76% (!) 89% 93%  Weight:      Height:        Intake/Output from previous day:   Intake/Output Summary (Last 24 hours) at 04/03/2020 1227 Last data filed at 04/03/2020 0945 Gross per 24 hour  Intake 300 ml  Output 1200 ml  Net -900 ml   Physical Exam Constitutional:      Appearance: Normal appearance.  HENT:     Right Ear: Tympanic membrane normal.  Eyes:     General: Scleral icterus present.     Pupils: Pupils are equal, round, and reactive to light.  Cardiovascular:     Rate and Rhythm: Normal rate and regular rhythm.     Pulses: Normal pulses.  Pulmonary:     Effort: Pulmonary effort is normal.     Breath sounds: Wheezing and rhonchi present.  Abdominal:     General: Abdomen is flat.  Bowel sounds are normal.  Musculoskeletal:        General: Normal range of motion.  Skin:    General: Skin is warm.  Neurological:     General: No focal deficit present.     Mental Status: He is alert. Mental status is at baseline.  Psychiatric:        Mood and Affect: Mood normal.        Behavior: Behavior normal.        Thought Content: Thought content normal.        Judgment: Judgment normal.     Lab Results:  Basic Metabolic Panel:    Component Value Date/Time   NA 136 04/02/2020 0518   NA 136 10/17/2018 1433   K 4.1 04/02/2020 0518   CL 102 04/02/2020 0518   CO2 25 04/02/2020 0518   BUN 9 04/02/2020 0518   BUN 6 10/17/2018 1433   CREATININE 0.67 04/02/2020 0518   GLUCOSE 101 (H) 04/02/2020 0518   CALCIUM 8.4 (L) 04/02/2020 0518   CBC:    Component Value Date/Time   WBC 12.8 (H) 04/03/2020 0448   HGB 6.9 (LL) 04/03/2020 0448   HGB 9.6 (L) 10/17/2018 1433   HCT 17.9 (L) 04/03/2020 0448   HCT 26.2 (L) 10/17/2018 1433   PLT 260 04/03/2020 0448   PLT 297 10/17/2018 1433   MCV 94.2 04/03/2020  0448   MCV 108 (H) 10/17/2018 1433   NEUTROABS 4.9 04/01/2020 0900   NEUTROABS 7.0 10/17/2018 1433   LYMPHSABS 6.6 (H) 04/01/2020 0900   LYMPHSABS 4.6 (H) 10/17/2018 1433   MONOABS 1.8 (H) 04/01/2020 0900   EOSABS 0.1 04/01/2020 0900   EOSABS 0.2 10/17/2018 1433   BASOSABS 0.0 04/01/2020 0900   BASOSABS 0.1 10/17/2018 1433    Recent Results (from the past 240 hour(s))  Resp Panel by RT-PCR (Flu A&B, Covid) Nasopharyngeal Swab     Status: None   Collection Time: 04/01/20  3:14 PM   Specimen: Nasopharyngeal Swab; Nasopharyngeal(NP) swabs in vial transport medium  Result Value Ref Range Status   SARS Coronavirus 2 by RT PCR NEGATIVE NEGATIVE Final    Comment: (NOTE) SARS-CoV-2 target nucleic acids are NOT DETECTED.  The SARS-CoV-2 RNA is generally detectable in upper respiratory specimens during the acute phase of infection. The lowest concentration of SARS-CoV-2  viral copies this assay can detect is 138 copies/mL. A negative result does not preclude SARS-Cov-2 infection and should not be used as the sole basis for treatment or other patient management decisions. A negative result may occur with  improper specimen collection/handling, submission of specimen other than nasopharyngeal swab, presence of viral mutation(s) within the areas targeted by this assay, and inadequate number of viral copies(<138 copies/mL). A negative result must be combined with clinical observations, patient history, and epidemiological information. The expected result is Negative.  Fact Sheet for Patients:  BloggerCourse.comhttps://www.fda.gov/media/152166/download  Fact Sheet for Healthcare Providers:  SeriousBroker.ithttps://www.fda.gov/media/152162/download  This test is no t yet approved or cleared by the [place] FDA and  has been authorized for detection and/or diagnosis of SARS-CoV-2 by FDA under an Emergency Use Authorization (EUA). This EUA will remain  in effect (meaning this test can be used) for the duration of the COVID-19 declaration under Section 564(b)(1) of the Act, 21 U.S.C.section 360bbb-3(b)(1), unless the authorization is terminated  or revoked sooner.       Influenza A by PCR NEGATIVE NEGATIVE Final   Influenza B by PCR NEGATIVE NEGATIVE Final    Comment: (NOTE) The Xpert Xpress SARS-CoV-2/FLU/RSV plus assay is intended as an aid in the diagnosis of influenza from Nasopharyngeal swab specimens and should not be used as a sole basis for treatment. Nasal washings and aspirates are unacceptable for Xpert Xpress SARS-CoV-2/FLU/RSV testing.  Fact Sheet for Patients: BloggerCourse.comhttps://www.fda.gov/media/152166/download  Fact Sheet for Healthcare Providers: SeriousBroker.ithttps://www.fda.gov/media/152162/download  This test is not yet approved or cleared by the [place] FDA and has been authorized for detection and/or diagnosis of SARS-CoV-2 by FDA under an Emergency Use Authorization  (EUA). This EUA will remain in effect (meaning this test can be used) for the duration of the COVID-19 declaration under Section 564(b)(1) of the Act, 21 U.S.C. section 360bbb-3(b)(1), unless the authorization is terminated or revoked.  Performed at [facility], [address] [place], [place] [address]   Urine culture     Status: None   Collection Time: 04/01/20  5:42 PM   Specimen: Urine, Random  Result Value Ref Range Status   Specimen Description   Final    URINE, RANDOM Performed at [facility], [address] [place], [place] [address]    Special Requests   Final    NONE Performed at [facility], [address] [place], [place] [address]    Culture   Final    NO GROWTH Performed at [facility], [address] [place], [place] [address]    Report Status 04/02/2020 FINAL  Final  Culture, respiratory (non-expectorated)     Status: None (Preliminary result)   Collection Time: 04/02/20  3:00 PM   Specimen: Expectorated Sputum; Respiratory  Result Value Ref Range Status   Specimen Description   Final    EXPECTORATED SPUTUM Performed at [facility], [name] [place], [place] [address]    Special Requests   Final    Immunocompromised Performed at [facility], [address] [place], [place] [address]    Gram Stain   Final    ABUNDANT WBC PRESENT,BOTH PMN AND MONONUCLEAR MODERATE GRAM POSITIVE COCCI MODERATE GRAM NEGATIVE RODS MODERATE GRAM VARIABLE ROD    Culture   Final    CULTURE REINCUBATED FOR BETTER GROWTH Performed at [facility], [address], [place], [place] [address]    Report Status PENDING  Incomplete    Studies/Results: DG Chest Port 1 View  Result Date: 04/03/2020 CLINICAL DATA:  Sickle cell.  Chest pain. EXAM: PORTABLE CHEST 1 VIEW COMPARISON:  04/01/2020 FINDINGS: Heart size upper normal. Mild prominence of the pulmonary  vascularity unchanged. Prominent lung markings diffusely compatible with scarring unchanged. No acute infiltrate or effusion. Port-A-Cath tip at the cavoatrial junction unchanged. IMPRESSION: Prominent lung markings appear chronic and stable. No superimposed acute abnormality. Electronically Signed   By: Marlan Palau M.D.   On: 04/03/2020 10:44    Medications: Scheduled Meds: . abacavir-dolutegravir-lamiVUDine  1 tablet Oral QHS  . amoxicillin-clavulanate  1 tablet Oral Q12H  . apixaban  5 mg Oral BID  . azithromycin  500 mg Oral Daily  . Chlorhexidine Gluconate Cloth  6 each Topical Daily  . HYDROmorphone   Intravenous Q4H  . hydroxyurea  2,000 mg Oral QHS  . ipratropium-albuterol  3 mL Nebulization Q6H  . multivitamin with minerals  1 tablet Oral Q breakfast  . senna-docusate  1 tablet Oral BID   Continuous Infusions: . sodium chloride 1,000 mL (04/03/20 0010)   PRN Meds:.sodium chloride, diphenhydrAMINE, guaiFENesin-dextromethorphan, naloxone **AND** sodium chloride flush, ondansetron (ZOFRAN) IV, polyethylene glycol, prochlorperazine, sodium phosphate, traZODone  Consultants:  None  Procedures:  None  Antibiotics:  IV cefepime per pharmacy consult  Azithromycin p.o.  Assessment/Plan: Principal Problem:   Sickle cell pain crisis (HCC) Active Problems:   Generalized anxiety disorder   HIV (human immunodeficiency virus infection) (HCC)   History of pulmonary embolus (PE)   Chronic, continuous use of opioids   Leukocytosis  Sickle cell disease with pain crisis: Continue [facility] fluids at [facility].  Continue IV Dilaudid PCA, no changes in settings. Monitor vital signs very closely, reevaluate pain scale regularly, and supplemental oxygen as needed.  Acute on chronic respiratory failure with hypoxia: Oxygen saturation decreased 60s-70s.  Patient endorses shortness of breath.  Oxygen requirement is 4 L supplemental oxygen. Chest xray shows no acute cardiopulmonary  process Maintain oxygen saturation above 92% Repeat respiratory panel, previous COVID-19 was negative. Duo nebulizer every 6 hours as needed  Probable acute chest syndrome: Oxygen saturations 60s-70% on room air.  Current oxygen requirement is 4 L.  Initiate IV antibiotics.  IV cefepime per pharmacy consult and azithromycin. Chest x-ray does not show any acute cardiopulmonary processes.  Continue incentive spirometry. Repeat respiratory panel and respiratory culture.  Consider exchange transfusion in a.m.  Sickle cell anemia: Hemoglobin is 6.9, which is below patient's baseline of 7-8 g/dL.  Continue to follow closely.  Repeat CBC and type  and screen in a.m.  If hemoglobin is less than 6.5 g/dL, transfuse 1 unit PRBCs.  Chronic pain syndrome: Continue home medications  HIV disease: Review HIV RNA Quant and CD4T Helper as results become available. Continue home medications.   History of PE:  Continue Eliquis 5 mg twice daily      Code Status: Full Code Family Communication: N/A Disposition Plan: Not yet ready for discharge  Deseree Zemaitis Rennis Petty  APRN, [name], [name] [facility] [address] [place], [place] [address] [phone]  If 5PM-8AM, please contact night-coverage.  04/03/2020, 12:27 PM  LOS: 1 day

## 2020-04-03 NOTE — Progress Notes (Signed)
This morning pt's hemoglobin is 6.9 g/dl, M. Katherina Right, [name] was notified, Annamaria Helling, RN was notified. Patient is sleeping. Will continue monitor him.

## 2020-04-04 DIAGNOSIS — D5701 Hb-SS disease with acute chest syndrome: Secondary | ICD-10-CM

## 2020-04-04 DIAGNOSIS — J129 Viral pneumonia, unspecified: Secondary | ICD-10-CM

## 2020-04-04 DIAGNOSIS — J9621 Acute and chronic respiratory failure with hypoxia: Secondary | ICD-10-CM

## 2020-04-04 LAB — CBC WITH DIFFERENTIAL/PLATELET
Abs Immature Granulocytes: 0.03 10*3/uL (ref 0.00–0.07)
Basophils Absolute: 0 10*3/uL (ref 0.0–0.1)
Basophils Relative: 0 %
Eosinophils Absolute: 0.2 10*3/uL (ref 0.0–0.5)
Eosinophils Relative: 2 %
HCT: 19.5 % — ABNORMAL LOW (ref 39.0–52.0)
Hemoglobin: 7.1 g/dL — ABNORMAL LOW (ref 13.0–17.0)
Immature Granulocytes: 0 %
Lymphocytes Relative: 68 %
Lymphs Abs: 7.2 10*3/uL — ABNORMAL HIGH (ref 0.7–4.0)
MCH: 35 pg — ABNORMAL HIGH (ref 26.0–34.0)
MCHC: 36.4 g/dL — ABNORMAL HIGH (ref 30.0–36.0)
MCV: 96.1 fL (ref 80.0–100.0)
Monocytes Absolute: 1 10*3/uL (ref 0.1–1.0)
Monocytes Relative: 9 %
Neutro Abs: 2.3 10*3/uL (ref 1.7–7.7)
Neutrophils Relative %: 21 %
Platelets: 295 10*3/uL (ref 150–400)
RBC: 2.03 MIL/uL — ABNORMAL LOW (ref 4.22–5.81)
RDW: 22.7 % — ABNORMAL HIGH (ref 11.5–15.5)
WBC: 10.7 10*3/uL — ABNORMAL HIGH (ref 4.0–10.5)
nRBC: 6.1 % — ABNORMAL HIGH (ref 0.0–0.2)

## 2020-04-04 LAB — T-HELPER CELLS (CD4) COUNT (NOT AT ARMC)
CD4 % Helper T Cell: 26 % — ABNORMAL LOW (ref 33–65)
CD4 T Cell Abs: 1129 /uL (ref 400–1790)

## 2020-04-04 LAB — TYPE AND SCREEN
ABO/RH(D): O POS
Antibody Screen: NEGATIVE

## 2020-04-04 NOTE — Progress Notes (Signed)
Subjective: Martin [name] is a 29 year old male with a medical history significant for sickle cell disease, chronic pain syndrome, opiate dependence and tolerance, HIV disease, history of pulmonary embolism on Eliquis, and history of anemia of chronic disease was admitted for sickle cell pain crisis.  Today, patient states that he feels better.  Pain intensity is 6/10 primarily to low back and central chest.  Patient continues to endorse some shortness of breath.  Also, he endorses nasal congestion and productive cough.  He states that wheezing has improved.  He denies any headache, dizziness, urinary symptoms, nausea, vomiting, or diarrhea. Patient is afebrile and oxygen saturation is 93% on 3 L supplemental oxygen.  Objective:  Vital signs in last 24 hours:  Vitals:   04/04/20 0706 04/04/20 0825 04/04/20 0956 04/04/20 1207  BP:   110/75   Pulse:   91   Resp: 16  13 16   Temp:   98.4 F (36.9 C)   TempSrc:   Oral   SpO2: 93% 95% 93% 95%  Weight:      Height:        Intake/Output from previous day:   Intake/Output Summary (Last 24 hours) at 04/04/2020 1252 Last data filed at 04/04/2020 1059 Gross per 24 hour  Intake 340 ml  Output 1950 ml  Net -1610 ml    Physical Exam: General: Alert, awake, oriented x3, in no acute distress.  HEENT: [place]/AT PEERL, EOMI Neck: Trachea midline,  no masses, no thyromegal,y no JVD, no carotid bruit OROPHARYNX:  Moist, No exudate/ erythema/lesions.  Heart: Regular rate and rhythm, without murmurs, rubs, gallops, PMI non-displaced, no heaves or thrills on palpation.  Lungs: Clear to auscultation, no wheezing or rhonchi noted. No increased vocal fremitus resonant to percussion  Abdomen: Soft, nontender, nondistended, positive bowel sounds, no masses no hepatosplenomegaly noted..  Neuro: No focal neurological deficits noted cranial nerves II through XII grossly intact. DTRs 2+ bilaterally upper and lower extremities. Strength 5 out of 5 in  bilateral upper and lower extremities. Musculoskeletal: No warm swelling or erythema around joints, no spinal tenderness noted. Psychiatric: Patient alert and oriented x3, good insight and cognition, good recent to remote recall. Lymph node survey: No cervical axillary or inguinal lymphadenopathy noted.  Lab Results:  Basic Metabolic Panel:    Component Value Date/Time   NA 136 04/02/2020 0518   NA 136 10/17/2018 1433   K 4.1 04/02/2020 0518   CL 102 04/02/2020 0518   CO2 25 04/02/2020 0518   BUN 9 04/02/2020 0518   BUN 6 10/17/2018 1433   CREATININE 0.67 04/02/2020 0518   GLUCOSE 101 (H) 04/02/2020 0518   CALCIUM 8.4 (L) 04/02/2020 0518   CBC:    Component Value Date/Time   WBC 10.7 (H) 04/04/2020 0515   HGB 7.1 (L) 04/04/2020 0515   HGB 9.6 (L) 10/17/2018 1433   HCT 19.5 (L) 04/04/2020 0515   HCT 26.2 (L) 10/17/2018 1433   PLT 295 04/04/2020 0515   PLT 297 10/17/2018 1433   MCV 96.1 04/04/2020 0515   MCV 108 (H) 10/17/2018 1433   NEUTROABS 2.3 04/04/2020 0515   NEUTROABS 7.0 10/17/2018 1433   LYMPHSABS 7.2 (H) 04/04/2020 0515   LYMPHSABS 4.6 (H) 10/17/2018 1433   MONOABS 1.0 04/04/2020 0515   EOSABS 0.2 04/04/2020 0515   EOSABS 0.2 10/17/2018 1433   BASOSABS 0.0 04/04/2020 0515   BASOSABS 0.1 10/17/2018 1433    Recent Results (from the past 240 hour(s))  Resp Panel by RT-PCR (Flu A&B, Covid)  Nasopharyngeal Swab     Status: None   Collection Time: 04/01/20  3:14 PM   Specimen: Nasopharyngeal Swab; Nasopharyngeal(NP) swabs in vial transport medium  Result Value Ref Range Status   SARS Coronavirus 2 by RT PCR NEGATIVE NEGATIVE Final    Comment: (NOTE) SARS-CoV-2 target nucleic acids are NOT DETECTED.  The SARS-CoV-2 RNA is generally detectable in upper respiratory specimens during the acute phase of infection. The lowest concentration of SARS-CoV-2 viral copies this assay can detect is 138 copies/mL. A negative result does not preclude SARS-Cov-2 infection and  should not be used as the sole basis for treatment or other patient management decisions. A negative result may occur with  improper specimen collection/handling, submission of specimen other than nasopharyngeal swab, presence of viral mutation(s) within the areas targeted by this assay, and inadequate number of viral copies(<138 copies/mL). A negative result must be combined with clinical observations, patient history, and epidemiological information. The expected result is Negative.  Fact Sheet for Patients:  BloggerCourse.comhttps://www.fda.gov/media/152166/download  Fact Sheet for Healthcare Providers:  SeriousBroker.ithttps://www.fda.gov/media/152162/download  This test is no t yet approved or cleared by the [place] FDA and  has been authorized for detection and/or diagnosis of SARS-CoV-2 by FDA under an Emergency Use Authorization (EUA). This EUA will remain  in effect (meaning this test can be used) for the duration of the COVID-19 declaration under Section 564(b)(1) of the Act, 21 U.S.C.section 360bbb-3(b)(1), unless the authorization is terminated  or revoked sooner.       Influenza A by PCR NEGATIVE NEGATIVE Final   Influenza B by PCR NEGATIVE NEGATIVE Final    Comment: (NOTE) The Xpert Xpress SARS-CoV-2/FLU/RSV plus assay is intended as an aid in the diagnosis of influenza from Nasopharyngeal swab specimens and should not be used as a sole basis for treatment. Nasal washings and aspirates are unacceptable for Xpert Xpress SARS-CoV-2/FLU/RSV testing.  Fact Sheet for Patients: BloggerCourse.comhttps://www.fda.gov/media/152166/download  Fact Sheet for Healthcare Providers: SeriousBroker.ithttps://www.fda.gov/media/152162/download  This test is not yet approved or cleared by the [place] FDA and has been authorized for detection and/or diagnosis of SARS-CoV-2 by FDA under an Emergency Use Authorization (EUA). This EUA will remain in effect (meaning this test can be used) for the duration of the COVID-19 declaration  under Section 564(b)(1) of the Act, 21 U.S.C. section 360bbb-3(b)(1), unless the authorization is terminated or revoked.  Performed at [facility], [address] [place], [place] [address]   Urine culture     Status: None   Collection Time: 04/01/20  5:42 PM   Specimen: Urine, Random  Result Value Ref Range Status   Specimen Description   Final    URINE, RANDOM Performed at [facility], [address] [place], [place] [address]    Special Requests   Final    NONE Performed at [facility], [address] [place], [place] [address]    Culture   Final    NO GROWTH Performed at [facility], [address] [place], [place] [address]    Report Status 04/02/2020 FINAL  Final  Culture, respiratory (non-expectorated)     Status: None (Preliminary result)   Collection Time: 04/02/20  3:00 PM   Specimen: Expectorated Sputum; Respiratory  Result Value Ref Range Status   Specimen Description   Final    EXPECTORATED SPUTUM Performed at [facility], [address] [place], [place] [address]    Special Requests   Final    Immunocompromised  Performed at [facility], [address] [place], [place] [address]    Gram Stain   Final    ABUNDANT WBC PRESENT,BOTH PMN AND MONONUCLEAR MODERATE GRAM POSITIVE COCCI MODERATE GRAM NEGATIVE RODS MODERATE GRAM VARIABLE ROD    Culture   Final    ABUNDANT Normal respiratory flora-no Staph aureus or Pseudomonas seen Performed at [facility], [address], [place], [place] [address]    Report Status PENDING  Incomplete  Respiratory Panel by PCR     Status: Abnormal   Collection Time: 04/03/20  1:04 PM  Result Value Ref Range Status   Adenovirus NOT DETECTED NOT DETECTED Final   Coronavirus 229E NOT DETECTED NOT DETECTED Final    Comment: (NOTE) The Coronavirus on the Respiratory Panel, DOES NOT test for the novel  Coronavirus  (2019 nCoV)    Coronavirus HKU1 NOT DETECTED NOT DETECTED Final   Coronavirus NL63 NOT DETECTED NOT DETECTED Final   Coronavirus OC43 NOT DETECTED NOT DETECTED Final   Metapneumovirus DETECTED (A) NOT DETECTED Final   Rhinovirus / Enterovirus NOT DETECTED NOT DETECTED Final   Influenza A NOT DETECTED NOT DETECTED Final   Influenza B NOT DETECTED NOT DETECTED Final   Parainfluenza Virus 1 NOT DETECTED NOT DETECTED Final   Parainfluenza Virus 2 NOT DETECTED NOT DETECTED Final   Parainfluenza Virus 3 NOT DETECTED NOT DETECTED Final   Parainfluenza Virus 4 NOT DETECTED NOT DETECTED Final   Respiratory Syncytial Virus NOT DETECTED NOT DETECTED Final   Bordetella pertussis NOT DETECTED NOT DETECTED Final   Bordetella Parapertussis NOT DETECTED NOT DETECTED Final   Chlamydophila pneumoniae NOT DETECTED NOT DETECTED Final   Mycoplasma pneumoniae NOT DETECTED NOT DETECTED Final    Comment: Performed at [facility], [address] [place], [place] [address]    Studies/Results: DG Chest Port 1 View  Result Date: 04/03/2020 CLINICAL DATA:  Sickle cell.  Chest pain. EXAM: PORTABLE CHEST 1 VIEW COMPARISON:  04/01/2020 FINDINGS: Heart size upper normal. Mild prominence of the pulmonary vascularity unchanged. Prominent lung markings diffusely compatible with scarring unchanged. No acute infiltrate or effusion. Port-A-Cath tip at the cavoatrial junction unchanged. IMPRESSION: Prominent lung markings appear chronic and stable. No superimposed acute abnormality. Electronically Signed   By: Marlan Palau M.D.   On: 04/03/2020 10:44    Medications: Scheduled Meds: . abacavir-dolutegravir-lamiVUDine  1 tablet Oral QHS  . apixaban  5 mg Oral BID  . Chlorhexidine Gluconate Cloth  6 each Topical Daily  . HYDROmorphone   Intravenous Q4H  . hydroxyurea  2,000 mg Oral QHS  . ipratropium-albuterol  3 mL Nebulization BID  . multivitamin with minerals  1 tablet Oral Q breakfast  . senna-docusate  1  tablet Oral BID   Continuous Infusions: . sodium chloride 1,000 mL (04/03/20 0010)   PRN Meds:.sodium chloride, acetaminophen, diphenhydrAMINE, guaiFENesin-dextromethorphan, naloxone **AND** sodium chloride flush, ondansetron (ZOFRAN) IV, polyethylene glycol, prochlorperazine, sodium phosphate, traZODone  Consultants:  None  Procedures:  None  Antibiotics:  Discontinue antibiotics  Assessment/Plan: Principal Problem:   Sickle cell pain crisis (HCC) Active Problems:   Generalized anxiety disorder   HIV (human immunodeficiency virus infection) (HCC)   History of pulmonary embolus (PE)   Chronic, continuous use of opioids   Leukocytosis   Acute chest syndrome (HCC)   Acute and chronic respiratory failure with hypoxia (HCC)  Sickle cell disease with pain crisis: Continue IV fluids at [facility] IV Dilaudid PCA, settings decreased today. Monitor vital  signs closely, reevaluate pain scale regularly, and supplemental oxygen as needed.  Acute on chronic respiratory failure with hypoxia: Oxygen saturation improved today.  Saturation is 93% on 3 L.  Patient continues to endorse some shortness of breath.  Chest x-ray showed no acute cardiopulmonary process.  However, respiratory panel positive for metapneumovirus. Continue duo nebulizer treatments every 6 hours as needed Incentive spirometer Continue supplemental oxygen  Viral pneumonia: Oxygen saturation improved today.  Discontinue IV antibiotics Duo nebulizer treatments every 6 hours as needed Continue incentive spirometer  Sickle cell anemia: Hemoglobin 7.1, which is improved from previous.  Baseline is 7-8 g/dL.  Continue to follow closely.  Follow labs in AM.  Chronic pain syndrome: Home medications  HIV disease: Stable.  Continue antivirals.  Review HIV RNA quant as results become available.  History of PE: Continue Eliquis 5 mg   Code Status: Full Code Family Communication: N/A Disposition Plan: Not yet  ready for discharge   Swati Granberry Rennis Petty  APRN, [name], [name] [facility] [address] [place], [place] [address] [phone]  If 5PM-8AM, please contact night-coverage.  04/04/2020, 12:52 PM  LOS: 2 days

## 2020-04-04 NOTE — TOC Initial Note (Signed)
Transition of Care [facility]) - Initial/Assessment Note    Patient Details  Name: Martin [name] MRN: [id number] Date of Birth: 23-Jul-1990  Transition of Care [facility]) CM/SW Contact:    Bartholome Bill, RN Phone Number: 04/04/2020, 11:26 AM             Expected Discharge Plan: Home/Self Care Barriers to Discharge: Continued Medical Work up  Expected Discharge Plan and Services Expected Discharge Plan: Home/Self Care       Living arrangements for the past 2 months: Single Family Home Expected Discharge Date:  (unknown)                     Prior Living Arrangements/Services Living arrangements for the past 2 months: Single Family Home Lives with:: Self          Activities of Daily Living Home Assistive Devices/Equipment: Eyeglasses ADL Screening (condition at time of admission) Patient's cognitive ability adequate to safely complete daily activities?: Yes Is the patient deaf or have difficulty hearing?: No Does the patient have difficulty seeing, even when wearing glasses/contacts?: No Does the patient have difficulty concentrating, remembering, or making decisions?: No Patient able to express need for assistance with ADLs?: Yes Does the patient have difficulty dressing or bathing?: No Independently performs ADLs?: Yes (appropriate for developmental age) Does the patient have difficulty walking or climbing stairs?: No Weakness of Legs: None Weakness of Arms/Hands: None  Admission diagnosis:  Hypoxia [R09.02] Sickle cell pain crisis (HCC) [D57.00] Patient Active Problem List   Diagnosis Date Noted  . Acute and chronic respiratory failure with hypoxia (HCC) 04/03/2020  . 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/18/2020  . 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 Itching of male genitalia 02/22/2020  . Abnormal penile discharge, without blood 02/22/2020  . Acute chest syndrome (HCC) 02/19/2020  . Leukocytosis 01/02/2020  . Anxiety 11/27/2019  . Proteinuria 11/27/2019  . Scleral  icterus   . Chronic, continuous use of opioids 08/15/2019  . Seasonal allergies 08/15/2019  . Hypoxia   . Chest congestion   . Sickle cell anemia with crisis (HCC) 08/04/2019  . Other chronic pain 08/04/2019  . History of pulmonary embolus (PE) 08/04/2019  . Acute pulmonary embolism (HCC) 02/10/2019  . Acute chest syndrome due to sickle cell crisis (HCC) 02/10/2019  . Sickle cell crisis (HCC) 01/17/2019  . Heart murmur 02/03/2017  . Sickle cell disease with crisis (HCC) 02/02/2017  . 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 Sickle cell pain crisis (HCC) 02/02/2017  . 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HCC) 12/20/2016  . Vitamin D deficiency 08/14/2016  . High risk medication use 09/27/2014  . Generalized anxiety disorder 05/19/2014  . GERD (gastroesophageal reflux disease) 05/19/2014  . HIV (human immunodeficiency virus infection) (HCC) 05/23/2012   PCP:  [name], [name] Pharmacy:   [facility] [phone] - [place], [place] - [address] AT [facility] [address] [place] [address] Phone: [phone] Fax: [phone]  [facility] - [place], [place] - [address] [place] [address] [place] [address] Phone: [phone] Fax: [phone]       Readmission Risk Interventions Readmission Risk Prevention Plan 04/04/2020 11/22/2019  Transportation Screening Complete Complete  PCP or Specialist Appt within 3-5 Days - Complete  HRI or Home Care Consult - Complete  Social Work Consult for Recovery Care Planning/Counseling - Complete  Palliative Care Screening - Not Applicable  Medication Review (RN Care Manager) Complete Complete  PCP  or Specialist appointment within 3-5 days of discharge Not Complete -  PCP/Specialist Appt Not Complete comments Not ready for dc -  HRI or Home Care Consult Not Complete -  HRI or Home Care Consult Pt Refusal Comments Not needed -  SW Recovery Care/Counseling  Consult Complete -  Palliative Care Screening Not Applicable -  Skilled Nursing Facility Not Applicable -  Some recent data might be hidden

## 2020-04-05 ENCOUNTER — Telehealth: Payer: Self-pay | Admitting: Family Medicine

## 2020-04-05 LAB — CULTURE, RESPIRATORY W GRAM STAIN: Culture: NORMAL

## 2020-04-05 LAB — HIV-1 RNA QUANT-NO REFLEX-BLD
HIV 1 RNA Quant: 3000 copies/mL
LOG10 HIV-1 RNA: 3.477 log10copy/mL

## 2020-04-05 MED ORDER — OXYCODONE HCL 5 MG PO TABS: 5.0000 mg | ORAL_TABLET | ORAL | Status: DC | PRN

## 2020-04-05 MED ORDER — LIP MEDEX EX OINT
1.0000 "application " | TOPICAL_OINTMENT | CUTANEOUS | Status: DC | PRN
  Administered 2020-04-05: 1 via TOPICAL
  Filled 2020-04-05: qty 7

## 2020-04-05 MED ORDER — HYDROMORPHONE 1 MG/ML IV SOLN
INTRAVENOUS | Status: DC
Start: 2020-04-05 — End: 2020-04-05
  Administered 2020-04-05: 30 mg via INTRAVENOUS
  Administered 2020-04-05: 9.5 mg via INTRAVENOUS
  Filled 2020-04-05: qty 30

## 2020-04-05 MED ORDER — HEPARIN SOD (PORK) LOCK FLUSH 100 UNIT/ML IV SOLN
500.0000 [IU] | INTRAVENOUS | Status: AC | PRN
  Administered 2020-04-05: 500 [IU]
  Filled 2020-04-05: qty 5

## 2020-04-05 MED ORDER — OXYCODONE-ACETAMINOPHEN 5-325 MG PO TABS: 1.0000 | ORAL_TABLET | ORAL | Status: DC | PRN

## 2020-04-05 NOTE — Progress Notes (Signed)
Patient given all discharge instructions. Patient understands follow up appointment. Patient's Port removed. Patient wheeled down to private vehicle.

## 2020-04-05 NOTE — Discharge Summary (Signed)
Physician Discharge Summary  Martin [name] ZOX:[id number] DOB: [birth year] DOA: 04/01/2020  PCP: [name], [name]  Admit date: 04/01/2020  Discharge date: 04/05/2020  Discharge Diagnoses:  Principal Problem:   Sickle cell pain crisis (HCC) Active Problems:   Generalized anxiety disorder   HIV (human immunodeficiency virus infection) (HCC)   History of pulmonary embolus (PE)   Chronic, continuous use of opioids   Leukocytosis   Acute and chronic respiratory failure with hypoxia (HCC)   Pneumonia, viral   Discharge Condition: Stable  Disposition:  Pt is discharged home in good condition and is to follow up with [name], [name] this week to have labs evaluated. Martin [name] is instructed to increase activity slowly and balance with rest for the next few days, and use prescribed medication to complete treatment of pain  Diet: Regular Wt Readings from Last 3 Encounters:  04/04/20 70.5 kg  03/26/20 67.1 kg  02/27/20 66.9 kg    History of present illness:  Martin [name]  is a 29 y.o. male with a medical history significant for sickle cell disease, chronic pain syndrome, opiate dependence and tolerance, chronic hypoxia on home oxygen, history of HIV disease, history of PE on Eliquis, and history of anemia of chronic disease presents to the emergency department with complaints of generalized pain over the past 3-4 days.  Pain is consistent with his typical sickle cell pain crisis.  He attributes pain crisis to changes in weather and increased stressors at home.  He has been taking Percocet 10-325 mg every 4-6 hours without sustained relief.  Current pain intensity is 10/10 characterized as constant and throbbing.  Pain persists right IV Dilaudid, and IV fluids.  Patient complains of some chest pain, congestion, and had an oxygen requirement of 3 L.  Chest x-ray showed no acute cardiopulmonary process.  Also, CT of chest was negative for pulmonary embolus.   Patient states that he has been consistently taking Eliquis. Patient endorses sinus pressure and productive cough over the past several days. He has not attempted any OTC interventions to alleviate this problem. Patient denies any shortness of breath, dizziness, paresthesias, nausea, vomiting, or diarrhea.   ER Course:  Vital signs show: BP 96/68   Pulse 91   Temp 98.9 F (37.2 C) (Oral)   Resp 20   Ht  (1.956 m)   Wt 68 kg   SpO2 95%   BMI 17.79 kg/m .  Chest x-ray shows stable chronic interstitial lung changes.  No acute cardiopulmonary abnormality.  CT angio shows no evidence of pulmonary embolism, no acute pulmonary findings.  Scarring has developed in the peripheral left upper lobe and the site of abnormal 2020 lung opacity.  Elsewhere stable lower lobe predominant scarring.  Inflammatory appearing lymph nodes in the bilateral chest of mildly increased since last year.  Of the sequela of sickle cell disease including calcified splenic atrophy present.  WBCs 13.1.  Hemoglobin 7.4, consistent with patient's baseline.  AST 54, ALT 29, and total bilirubin 9.7.  Pain persists despite IV Dilaudid and IV fluids.  Admit to MedSurg for further management of sickle cell pain crisis.  Hospital Course:  Sickle cell disease with pain crisis:  Patient was admitted for sickle cell pain crisis and managed appropriately with IVF, IV Dilaudid via PCA  as well as other adjunct therapies per sickle cell pain management protocols. Dilaudid PCA was weaned appropriately. Patient says that pain intensity is 5/10 and is requesting discharge home. Patient advised to  resume his home medications. Opiate medications are managed by patient's PCP. Advised to schedule medication management appointment in 2 weeks.   Viral pneumonia:  Respiratory panel showed metapneumovirus. Supportive care given. Patient will continue albuterol, 2 puffs every 4 hours as needed for shortness of breath, wheezing and/or persistent  cough. Also advised to continue rest and increase fluid intake.   Sickle cell anemia:  Prior to discharge, hemoglobin is 7.1 which is consistent with his baseline. Patient will follow up with PCP for CBC and CMP on next week.   HIV disease:  Anti retrovirals continued throughout admission without interruption. CD4 T cell Abs completed. Patient will follow up with infectious disease as an outpatient.   History of PE:  Eliquis continued throughout admission. Discussed the importance of taking this drug consistently in order to achieve positive outcomes.   Patient was therefore discharged home today in a hemodynamically stable condition. He is aware of all upcoming appointments.   Martin [name] will follow-up with PCP within 1 week of this discharge. Martin [name] was counseled extensively about nonpharmacologic means of pain management, patient verbalized understanding and was appreciative of  the care received during this admission.   We discussed the need for good hydration, monitoring of hydration status, avoidance of heat, cold, stress, and infection triggers. We discussed the need to be adherent with taking Hydrea and other home medications. Patient was reminded of the need to seek medical attention immediately if any symptom of bleeding, anemia, or infection occurs.   Discharge Exam: Vitals:   04/05/20 0937 04/05/20 1014  BP:  96/66  Pulse:  74  Resp: 16 14  Temp:  98.3 F (36.8 C)  SpO2:  90%   Vitals:   04/05/20 0416 04/05/20 0748 04/05/20 0937 04/05/20 1014  BP: 106/73   96/66  Pulse: 78   74  Resp: 16  16 14   Temp: 98.2 F (36.8 C)   98.3 F (36.8 C)  TempSrc: Oral   Oral  SpO2: 97% 96%  90%  Weight:      Height:       Physical Exam Constitutional:      Appearance: Normal appearance.  HENT:     Nose: Nose normal.  Eyes:     Pupils: Pupils are equal, round, and reactive to light.  Cardiovascular:     Rate and Rhythm: Normal rate and regular rhythm.  Pulmonary:      Effort: Pulmonary effort is normal.  Abdominal:     General: Abdomen is flat. Bowel sounds are normal.  Musculoskeletal:        General: Normal range of motion.  Skin:    General: Skin is warm.  Neurological:     General: No focal deficit present.     Mental Status: He is alert. Mental status is at baseline.  Psychiatric:        Mood and Affect: Mood normal.        Behavior: Behavior normal.        Thought Content: Thought content normal.        Judgment: Judgment normal.     Discharge Instructions  Discharge Instructions    Discharge patient   Complete by: As directed    Discharge disposition: 01-Home or Self Care   Discharge patient date: 04/05/2020     Allergies as of 04/05/2020      Reactions   Tape Rash, Other (See Comments)   PLEASE DO NOT USE THE CLEAR, THICK, "PLASTIC" TAPE- only paper tape is tolerated  Ketorolac Swelling, Other (See Comments)   Patient reports facial edema and left arm edema after administration.      Medication List    TAKE these medications   apixaban 5 MG Tabs tablet Commonly known as: Eliquis Take 1 tablet (5 mg total) by mouth 2 (two) times daily.   hydroxyurea 500 MG capsule Commonly known as: HYDREA Take 4 capsules (2,000 mg total) by mouth at bedtime.   multivitamin with minerals Tabs tablet Take 1 tablet by mouth daily with breakfast.   naloxone 0.4 MG/ML injection Commonly known as: NARCAN As needed. What changed:   how much to take  how to take this  when to take this  reasons to take this  additional instructions   oxyCODONE-acetaminophen 10-325 MG tablet Commonly known as: PERCOCET Take 1 tablet by mouth every 4 (four) hours as needed for up to 15 days for pain.   traZODone 100 MG tablet Commonly known as: DESYREL Take 1 tablet (100 mg total) by mouth at bedtime as needed for sleep.   Triumeq 600-50-300 MG tablet Generic drug: abacavir-dolutegravir-lamiVUDine Take 1 tablet by mouth at bedtime.    Vitamin D (Ergocalciferol) 1.25 MG (50000 UNIT) Caps capsule Commonly known as: DRISDOL Take 1 capsule (50,000 Units total) by mouth every 7 (seven) days.       The results of significant diagnostics from this hospitalization (including imaging, microbiology, ancillary and laboratory) are listed below for reference.    Significant Diagnostic Studies: DG Chest 2 View  Result Date: 04/01/2020 CLINICAL DATA:  29 year old male with hypoxia, productive cough for 2 days. Sickle cell disease. HIV. EXAM: CHEST - 2 VIEW COMPARISON:  Chest radiographs 03/17/2020 and earlier. FINDINGS: Stable right chest power port. Stable lung volumes and mediastinal contours. The chronic bilateral increased pulmonary interstitial markings, stable since last month. Small peripheral left upper lobe lung nodule appears stable since August. No superimposed pneumothorax, pleural effusion or acute pulmonary opacity. Visualized tracheal air column is within normal limits. Negative visible bowel gas pattern. No acute osseous abnormality identified. IMPRESSION: Stable chronic interstitial lung changes. No acute cardiopulmonary abnormality. Electronically Signed   By: Odessa Fleming M.D.   On: 04/01/2020 08:05   DG Chest 2 View  Result Date: 03/17/2020 CLINICAL DATA:  Hypoxia.  Sickle cell disease. EXAM: CHEST - 2 VIEW COMPARISON:  February 21, 2020 FINDINGS: There is a well-positioned right-sided Port-A-Cath in place. Chronic coarse interstitial lung markings are again noted. There is no pneumothorax. No large pleural effusion. The heart size is stable. There is no acute osseous abnormality. There is no focal infiltrate. IMPRESSION: No active cardiopulmonary disease. Electronically Signed   By: Katherine Mantle M.D.   On: 03/17/2020 03:41   CT Angio Chest PE W/Cm &/Or Wo Cm  Result Date: 04/01/2020 CLINICAL DATA:  30 year old male with sickle cell pain onset today. Pain radiating to the back. EXAM: CT ANGIOGRAPHY CHEST WITH  CONTRAST TECHNIQUE: Multidetector CT imaging of the chest was performed using the standard protocol during bolus administration of intravenous contrast. Multiplanar CT image reconstructions and MIPs were obtained to evaluate the vascular anatomy. CONTRAST:  6mL OMNIPAQUE IOHEXOL 350 MG/ML SOLN COMPARISON:  Chest radiographs earlier today.  CTA chest 02/10/2019. FINDINGS: Cardiovascular: Adequate contrast bolus timing in the pulmonary arterial tree. No focal filling defect identified in the pulmonary arteries to suggest acute pulmonary embolism. Restored patency to the right lower lobe branches where nonocclusive thrombus identified last year. Stable mild cardiomegaly. No pericardial effusion. Negative visible aorta. Right chest Port-A-Cath  remains in place. Mediastinum/Nodes: Smaller volume of residual thymus compared to last year. Prominent bilateral hilar lymph nodes are stable. Similar symmetric chronic axillary lymph node prominence has mildly increased since last year. But no suspicious lymphadenopathy. Lungs/Pleura: Major airways are patent. Mild subpleural scarring now at the site of left upper lobe pulmonary opacity last year. Superimposed bilateral chronic basilar predominant increased interstitial markings, mild areas of architectural distortion and mosaic attenuation are stable. No pleural effusion or acute pulmonary opacity identified. Upper Abdomen: Atrophied, calcified spleen. Negative visible liver, pancreas, adrenal glands, kidneys and bowel in the upper abdomen. Musculoskeletal: Course bone mineralization appears stable in the thorax. No acute osseous abnormality identified. Review of the MIP images confirms the above findings. IMPRESSION: 1. No evidence of acute pulmonary embolus. 2. No acute pulmonary finding. Scarring has developed in the peripheral left upper lobe at the site of abnormal 2020 lung opacity. Elsewhere stable lower lobe predominant scarring. 3. Inflammatory appearing lymph nodes  in the bilateral chest have mildly increased since last year. 4. Other sequelae of sickle cell disease including calcified splenic atrophy. Electronically Signed   By: Odessa Fleming M.D.   On: 04/01/2020 12:23   DG Chest Port 1 View  Result Date: 04/03/2020 CLINICAL DATA:  Sickle cell.  Chest pain. EXAM: PORTABLE CHEST 1 VIEW COMPARISON:  04/01/2020 FINDINGS: Heart size upper normal. Mild prominence of the pulmonary vascularity unchanged. Prominent lung markings diffusely compatible with scarring unchanged. No acute infiltrate or effusion. Port-A-Cath tip at the cavoatrial junction unchanged. IMPRESSION: Prominent lung markings appear chronic and stable. No superimposed acute abnormality. Electronically Signed   By: Marlan Palau M.D.   On: 04/03/2020 10:44    Microbiology: Recent Results (from the past 240 hour(s))  Resp Panel by RT-PCR (Flu A&B, Covid) Nasopharyngeal Swab     Status: None   Collection Time: 04/01/20  3:14 PM   Specimen: Nasopharyngeal Swab; Nasopharyngeal(NP) swabs in vial transport medium  Result Value Ref Range Status   SARS Coronavirus 2 by RT PCR NEGATIVE NEGATIVE Final    Comment: (NOTE) SARS-CoV-2 target nucleic acids are NOT DETECTED.  The SARS-CoV-2 RNA is generally detectable in upper respiratory specimens during the acute phase of infection. The lowest concentration of SARS-CoV-2 viral copies this assay can detect is 138 copies/mL. A negative result does not preclude SARS-Cov-2 infection and should not be used as the sole basis for treatment or other patient management decisions. A negative result may occur with  improper specimen collection/handling, submission of specimen other than nasopharyngeal swab, presence of viral mutation(s) within the areas targeted by this assay, and inadequate number of viral copies(<138 copies/mL). A negative result must be combined with clinical observations, patient history, and epidemiological information. The expected result is  Negative.  Fact Sheet for Patients:  BloggerCourse.com  Fact Sheet for Healthcare Providers:  SeriousBroker.it  This test is no t yet approved or cleared by the [place] FDA and  has been authorized for detection and/or diagnosis of SARS-CoV-2 by FDA under an Emergency Use Authorization (EUA). This EUA will remain  in effect (meaning this test can be used) for the duration of the COVID-19 declaration under Section 564(b)(1) of the Act, 21 U.S.C.section 360bbb-3(b)(1), unless the authorization is terminated  or revoked sooner.       Influenza A by PCR NEGATIVE NEGATIVE Final   Influenza B by PCR NEGATIVE NEGATIVE Final    Comment: (NOTE) The Xpert Xpress SARS-CoV-2/FLU/RSV plus assay is intended as an aid in the diagnosis of  influenza from Nasopharyngeal swab specimens and should not be used as a sole basis for treatment. Nasal washings and aspirates are unacceptable for Xpert Xpress SARS-CoV-2/FLU/RSV testing.  Fact Sheet for Patients: BloggerCourse.com  Fact Sheet for Healthcare Providers: SeriousBroker.it  This test is not yet approved or cleared by the [place] FDA and has been authorized for detection and/or diagnosis of SARS-CoV-2 by FDA under an Emergency Use Authorization (EUA). This EUA will remain in effect (meaning this test can be used) for the duration of the COVID-19 declaration under Section 564(b)(1) of the Act, 21 U.S.C. section 360bbb-3(b)(1), unless the authorization is terminated or revoked.  Performed at [facility], [address], [place], [place] [address]   Urine culture     Status: None   Collection Time: 04/01/20  5:42 PM   Specimen: Urine, Random  Result Value Ref Range Status   Specimen Description   Final    URINE, RANDOM Performed at [facility], [address], [place], [place] [address]     Special Requests   Final    NONE Performed at [facility], [address], [place], [place] [address]    Culture   Final    NO GROWTH Performed at [facility], [address] [place], [place] [address]    Report Status 04/02/2020 FINAL  Final  Culture, respiratory (non-expectorated)     Status: None   Collection Time: 04/02/20  3:00 PM   Specimen: Expectorated Sputum; Respiratory  Result Value Ref Range Status   Specimen Description   Final    EXPECTORATED SPUTUM Performed at [facility], [address] [place], [place] [address]    Special Requests   Final    Immunocompromised Performed at [facility], [address], [place], [place] [address]    Gram Stain   Final    ABUNDANT WBC PRESENT,BOTH PMN AND MONONUCLEAR MODERATE GRAM POSITIVE COCCI MODERATE GRAM NEGATIVE RODS MODERATE GRAM VARIABLE ROD    Culture   Final    ABUNDANT Normal respiratory flora-no Staph aureus or Pseudomonas seen Performed at [facility], [address], [place], [place] [address]    Report Status 04/05/2020 FINAL  Final  Respiratory Panel by PCR     Status: Abnormal   Collection Time: 04/03/20  1:04 PM  Result Value Ref Range Status   Adenovirus NOT DETECTED NOT DETECTED Final   Coronavirus 229E NOT DETECTED NOT DETECTED Final    Comment: (NOTE) The Coronavirus on the Respiratory Panel, DOES NOT test for the novel  Coronavirus (2019 nCoV)    Coronavirus HKU1 NOT DETECTED NOT DETECTED Final   Coronavirus NL63 NOT DETECTED NOT DETECTED Final   Coronavirus OC43 NOT DETECTED NOT DETECTED Final   Metapneumovirus DETECTED (A) NOT DETECTED Final   Rhinovirus / Enterovirus NOT DETECTED NOT DETECTED Final   Influenza A NOT DETECTED NOT DETECTED Final   Influenza B NOT DETECTED NOT DETECTED Final   Parainfluenza Virus 1 NOT DETECTED NOT DETECTED Final   Parainfluenza Virus 2 NOT DETECTED NOT DETECTED Final   Parainfluenza  Virus 3 NOT DETECTED NOT DETECTED Final   Parainfluenza Virus 4 NOT DETECTED NOT DETECTED Final   Respiratory Syncytial Virus NOT DETECTED NOT DETECTED Final   Bordetella pertussis NOT DETECTED NOT DETECTED Final   Bordetella Parapertussis NOT DETECTED NOT DETECTED Final   Chlamydophila pneumoniae NOT DETECTED NOT DETECTED Final   Mycoplasma pneumoniae NOT DETECTED NOT DETECTED Final  Comment: Performed at [facility], [address], [place], [place] [address]     Labs: Basic Metabolic Panel: Recent Labs  Lab 04/01/20 0900 04/02/20 0518  NA 134* 136  K 4.2 4.1  CL 102 102  CO2 24 25  GLUCOSE 96 101*  BUN 10 9  CREATININE 0.71 0.67  CALCIUM 8.5* 8.4*   Liver Function Tests: Recent Labs  Lab 04/01/20 0900  AST 54*  ALT 29  ALKPHOS 54  BILITOT 9.7*  PROT 8.7*  ALBUMIN 4.2   No results for input(s): LIPASE, AMYLASE in the last 168 hours. No results for input(s): AMMONIA in the last 168 hours. CBC: Recent Labs  Lab 04/01/20 0900 04/02/20 0518 04/03/20 0448 04/04/20 0515  WBC 13.1* 12.9* 12.8* 10.7*  NEUTROABS 4.9  --   --  2.3  HGB 7.4* 7.5* 6.9* 7.1*  HCT 20.4* 20.0* 17.9* 19.5*  MCV 96.7 95.7 94.2 96.1  PLT 289 285 260 295   Cardiac Enzymes: No results for input(s): CKTOTAL, CKMB, CKMBINDEX, TROPONINI in the last 168 hours. BNP: Invalid input(s): POCBNP CBG: No results for input(s): GLUCAP in the last 168 hours.  Time coordinating discharge: 35 minutes  Signed:  [name]  APRN, [name], [name] Patient Care [facility] [address] [place], [place] [address] [phone]  [facility] 04/05/2020, 3:24 PM

## 2020-04-06 ENCOUNTER — Other Ambulatory Visit: Payer: Self-pay | Admitting: Family Medicine

## 2020-04-06 ENCOUNTER — Other Ambulatory Visit: Payer: Self-pay | Admitting: Nurse Practitioner

## 2020-04-06 DIAGNOSIS — D571 Sickle-cell disease without crisis: Secondary | ICD-10-CM

## 2020-04-06 DIAGNOSIS — G8929 Other chronic pain: Secondary | ICD-10-CM

## 2020-04-06 MED ORDER — OXYCODONE-ACETAMINOPHEN 10-325 MG PO TABS: 1.0000 | ORAL_TABLET | ORAL | 0 refills | Status: DC | PRN

## 2020-04-06 MED FILL — OXYCODONE-APAP 10-325: 10-325 | 15 days supply | Qty: 90 | Fill #0

## 2020-04-06 NOTE — Progress Notes (Signed)
   [facility] Patient [facility] [address] [name], [place]  [address] Phone:  [phone]   Fax:  [phone]  Medication Refill - Medication: Oxycodone/APAP 10/325 mg   Patient called on-call service requesting call back related to medication refill.  He was discharged from the hospital on yesterday and states he was told that some medications would be sent in.  Further questioning the patient related to the medication that he is in need of he replied the oxycodone/acetaminophen 10/325 mg.  He admits that his primary care provider had instructed him that he can have his medication filled early because he will be leaving on Monday morning 8 AM for the trip to [place] and will not be able to refill his medications.  NP Bradly Chris contacted for further clarity

## 2020-04-07 NOTE — Telephone Encounter (Signed)
Done

## 2020-04-17 ENCOUNTER — Other Ambulatory Visit: Payer: Self-pay | Admitting: Family Medicine

## 2020-04-17 ENCOUNTER — Telehealth: Payer: Self-pay | Admitting: Family Medicine

## 2020-04-17 DIAGNOSIS — D571 Sickle-cell disease without crisis: Secondary | ICD-10-CM

## 2020-04-17 DIAGNOSIS — G8929 Other chronic pain: Secondary | ICD-10-CM

## 2020-04-17 MED ORDER — OXYCODONE-ACETAMINOPHEN 10-325 MG PO TABS: 1.0000 | ORAL_TABLET | ORAL | 0 refills | Status: DC | PRN

## 2020-04-17 NOTE — Telephone Encounter (Signed)
Done

## 2020-04-29 ENCOUNTER — Ambulatory Visit: Payer: Medicaid Other | Admitting: Family Medicine

## 2020-04-29 ENCOUNTER — Emergency Department (HOSPITAL_COMMUNITY)
Admission: EM | Admit: 2020-04-29 | Discharge: 2020-04-29 | Disposition: A | Discharge status: Home / Self Care | Payer: Medicaid Other | Attending: Emergency Medicine | Admitting: Emergency Medicine

## 2020-04-29 ENCOUNTER — Emergency Department (HOSPITAL_COMMUNITY): Payer: Medicaid Other

## 2020-04-29 ENCOUNTER — Encounter (HOSPITAL_COMMUNITY): Payer: Self-pay

## 2020-04-29 ENCOUNTER — Non-Acute Institutional Stay (HOSPITAL_COMMUNITY): Admission: AD | Admit: 2020-04-29 | Payer: Medicaid Other | Source: Ambulatory Visit | Admitting: Internal Medicine

## 2020-04-29 ENCOUNTER — Other Ambulatory Visit: Payer: Self-pay

## 2020-04-29 ENCOUNTER — Non-Acute Institutional Stay (EMERGENCY_DEPARTMENT_HOSPITAL)
Admission: AD | Admit: 2020-04-29 | Discharge: 2020-04-29 | Disposition: A | Discharge status: Home / Self Care | Payer: Medicaid Other | Source: Ambulatory Visit | Attending: Internal Medicine | Admitting: Internal Medicine

## 2020-04-29 ENCOUNTER — Telehealth (HOSPITAL_COMMUNITY): Payer: Self-pay | Admitting: General Practice

## 2020-04-29 DIAGNOSIS — D5700 Hb-SS disease with crisis, unspecified: Secondary | ICD-10-CM | POA: Insufficient documentation

## 2020-04-29 DIAGNOSIS — Z86711 Personal history of pulmonary embolism: Secondary | ICD-10-CM | POA: Diagnosis not present

## 2020-04-29 DIAGNOSIS — R0902 Hypoxemia: Secondary | ICD-10-CM

## 2020-04-29 DIAGNOSIS — Z79899 Other long term (current) drug therapy: Secondary | ICD-10-CM | POA: Diagnosis not present

## 2020-04-29 DIAGNOSIS — Z21 Asymptomatic human immunodeficiency virus [HIV] infection status: Secondary | ICD-10-CM | POA: Insufficient documentation

## 2020-04-29 DIAGNOSIS — M549 Dorsalgia, unspecified: Secondary | ICD-10-CM | POA: Diagnosis present

## 2020-04-29 LAB — CBC WITH DIFFERENTIAL/PLATELET
Abs Immature Granulocytes: 0.07 10*3/uL (ref 0.00–0.07)
Basophils Absolute: 0 10*3/uL (ref 0.0–0.1)
Basophils Relative: 0 %
Eosinophils Absolute: 0.4 10*3/uL (ref 0.0–0.5)
Eosinophils Relative: 3 %
HCT: 21.4 % — ABNORMAL LOW (ref 39.0–52.0)
Hemoglobin: 8.3 g/dL — ABNORMAL LOW (ref 13.0–17.0)
Immature Granulocytes: 1 %
Lymphocytes Relative: 42 %
Lymphs Abs: 5.5 10*3/uL — ABNORMAL HIGH (ref 0.7–4.0)
MCH: 36.7 pg — ABNORMAL HIGH (ref 26.0–34.0)
MCHC: 38.8 g/dL — ABNORMAL HIGH (ref 30.0–36.0)
MCV: 94.7 fL (ref 80.0–100.0)
Monocytes Absolute: 1.3 10*3/uL — ABNORMAL HIGH (ref 0.1–1.0)
Monocytes Relative: 10 %
Neutro Abs: 5.9 10*3/uL (ref 1.7–7.7)
Neutrophils Relative %: 44 %
Platelets: 277 10*3/uL (ref 150–400)
RBC: 2.26 MIL/uL — ABNORMAL LOW (ref 4.22–5.81)
RDW: 25.4 % — ABNORMAL HIGH (ref 11.5–15.5)
WBC: 13.2 10*3/uL — ABNORMAL HIGH (ref 4.0–10.5)
nRBC: 8.3 % — ABNORMAL HIGH (ref 0.0–0.2)

## 2020-04-29 LAB — COMPREHENSIVE METABOLIC PANEL
ALT: 19 U/L (ref 0–44)
AST: 47 U/L — ABNORMAL HIGH (ref 15–41)
Albumin: 3.9 g/dL (ref 3.5–5.0)
Alkaline Phosphatase: 58 U/L (ref 38–126)
Anion gap: 7 (ref 5–15)
BUN: 14 mg/dL (ref 6–20)
CO2: 25 mmol/L (ref 22–32)
Calcium: 8.8 mg/dL — ABNORMAL LOW (ref 8.9–10.3)
Chloride: 106 mmol/L (ref 98–111)
Creatinine, Ser: 0.75 mg/dL (ref 0.61–1.24)
GFR, Estimated: >60 mL/min (ref >60)
Glucose, Bld: 86 mg/dL (ref 70–99)
Potassium: 3.8 mmol/L (ref 3.5–5.1)
Sodium: 138 mmol/L (ref 135–145)
Total Bilirubin: 9.7 mg/dL — ABNORMAL HIGH (ref 0.3–1.2)
Total Protein: 8 g/dL (ref 6.5–8.1)

## 2020-04-29 LAB — RETICULOCYTES
Immature Retic Fract: 26.3 % — ABNORMAL HIGH (ref 2.3–15.9)
RBC.: 2.24 MIL/uL — ABNORMAL LOW (ref 4.22–5.81)
Retic Count, Absolute: 542.5 10*3/uL — ABNORMAL HIGH (ref 19.0–186.0)
Retic Ct Pct: 26.5 % — ABNORMAL HIGH (ref 0.4–3.1)

## 2020-04-29 MED ORDER — SODIUM CHLORIDE 0.9 % IV BOLUS
1000.0000 mL | Freq: Once | INTRAVENOUS | Status: AC
  Administered 2020-04-29: 1000 mL via INTRAVENOUS

## 2020-04-29 MED ORDER — HEPARIN SOD (PORK) LOCK FLUSH 100 UNIT/ML IV SOLN
500.0000 [IU] | INTRAVENOUS | Status: DC | PRN
  Administered 2020-04-29: 500 [IU]
  Filled 2020-04-29: qty 5

## 2020-04-29 MED ORDER — SODIUM CHLORIDE 0.45 % IV SOLN: INTRAVENOUS | Status: DC

## 2020-04-29 MED ORDER — HYDROMORPHONE 1 MG/ML IV SOLN
INTRAVENOUS | Status: DC
  Administered 2020-04-29: 10 mg via INTRAVENOUS
  Administered 2020-04-29: 30 mg via INTRAVENOUS
  Filled 2020-04-29: qty 30

## 2020-04-29 MED ORDER — SODIUM CHLORIDE 0.9% FLUSH: 9.0000 mL | INTRAVENOUS | Status: DC | PRN

## 2020-04-29 MED ORDER — DIPHENHYDRAMINE HCL 25 MG PO CAPS
25.0000 mg | ORAL_CAPSULE | ORAL | Status: DC | PRN
  Filled 2020-04-29: qty 1

## 2020-04-29 MED ORDER — NALOXONE HCL 0.4 MG/ML IJ SOLN: 0.4000 mg | INTRAMUSCULAR | Status: DC | PRN

## 2020-04-29 MED ORDER — ONDANSETRON HCL 4 MG/2ML IJ SOLN: 4.0000 mg | Freq: Four times a day (QID) | INTRAMUSCULAR | Status: DC | PRN

## 2020-04-29 MED ORDER — HYDROMORPHONE HCL 2 MG/ML IJ SOLN
2.0000 mg | Freq: Once | INTRAMUSCULAR | Status: AC
  Administered 2020-04-29: 2 mg via INTRAVENOUS
  Filled 2020-04-29: qty 1

## 2020-04-29 MED ORDER — SENNOSIDES-DOCUSATE SODIUM 8.6-50 MG PO TABS: 1.0000 | ORAL_TABLET | Freq: Two times a day (BID) | ORAL | Status: DC

## 2020-04-29 MED ORDER — POLYETHYLENE GLYCOL 3350 17 G PO PACK: 17.0000 g | PACK | Freq: Every day | ORAL | Status: DC | PRN

## 2020-04-29 MED ORDER — SODIUM CHLORIDE 0.9% FLUSH
10.0000 mL | INTRAVENOUS | Status: DC | PRN
  Administered 2020-04-29: 10 mL

## 2020-04-29 MED ORDER — DIPHENHYDRAMINE HCL 50 MG/ML IJ SOLN
25.0000 mg | Freq: Once | INTRAMUSCULAR | Status: AC
  Administered 2020-04-29: 25 mg via INTRAVENOUS
  Filled 2020-04-29: qty 1

## 2020-04-29 MED ORDER — ACETAMINOPHEN 500 MG PO TABS
1000.0000 mg | ORAL_TABLET | Freq: Once | ORAL | Status: AC
  Administered 2020-04-29: 1000 mg via ORAL
  Filled 2020-04-29: qty 2

## 2020-04-29 NOTE — Progress Notes (Signed)
Pt admitted to the day hospital from ED for treatment of sickle cell pain crisis. Pt reported pain to back rates 10/10. Pt given PO Tylenol,placed on Dilaudid PCA, and hydrated with IV fluids. At discharge patient reported pain a 4/10. Pt advised he can call day hospital tomorrow for treatment if pain worsens overnight at home, pt verbalized understanding.  Pt alert , oriented and ambulatory at discharge.

## 2020-04-29 NOTE — H&P (Signed)
[facility] History and Physical   Date: 04/29/2020  Patient name: Martin [name] Medical record number: [id number] Date of birth: [birth year] Age: 30 y.o. Gender: male PCP: [name], [name]  Attending physician: Quentin Angst, MD  Chief Complaint: Sickle cell pain  History of Present Illness: Martin [name] is a 30 year old male with a medical history significant for sickle cell disease, chronic pain syndrome, opiate dependence and tolerance, history of HIV disease, and history of PE on Eliquis presents complaining of low back, upper, and lower extremity pain over the past several days that is consistent with previous sickle cell pain crisis.  Patient attributes current pain crisis to changes in weather.  He states that he has been in [place] over the past 3 weeks and pain started when he returned 2 days ago.  He has been taking Percocet 10-325 mg without sustained relief, his last dose was this a.m.  He rates pain as 9/10 characterized as constant and throbbing.  Patient was treated and evaluated in the emergency room this a.m. without complete resolution.  Oxygen saturation decreased to 70s-80s, patient has not been using home oxygen for the past month.  Chest x-ray shows no acute cardiopulmonary process.  Patient's hemoglobin is 8.3, slightly improved from patient's baseline.  Agree with the ER provider that patient is appropriate to transition to sickle cell day clinic for pain management and extended observation.  Patient denies any fever, chills, chest pain, urinary symptoms, nausea, vomiting, or diarrhea.  No sick contacts, recent travel, or exposure to COVID-19.   Meds: Medications Prior to Admission  Medication Sig Dispense Refill Last Dose  . abacavir-dolutegravir-lamiVUDine (TRIUMEQ) 600-50-300 MG tablet Take 1 tablet by mouth at bedtime.    04/28/2020 at Unknown time  . apixaban (ELIQUIS) 5 MG TABS tablet Take 1 tablet (5 mg total) by mouth 2 (two)  times daily. 60 tablet 5 Past Week at Unknown time  . hydroxyurea (HYDREA) 500 MG capsule Take 4 capsules (2,000 mg total) by mouth at bedtime. 120 capsule 11 04/28/2020 at Unknown time  . Multiple Vitamin (MULTIVITAMIN WITH MINERALS) TABS tablet Take 1 tablet by mouth daily with breakfast.    04/28/2020 at Unknown time  . oxyCODONE-acetaminophen (PERCOCET) 10-325 MG tablet Take 1 tablet by mouth every 4 (four) hours as needed for up to 15 days for pain. 90 tablet 0 04/28/2020 at Unknown time  . traZODone (DESYREL) 100 MG tablet Take 1 tablet (100 mg total) by mouth at bedtime as needed for sleep. 90 tablet 3 Past Week at Unknown time  . Vitamin D, Ergocalciferol, (DRISDOL) 1.25 MG (50000 UT) CAPS capsule Take 1 capsule (50,000 Units total) by mouth every 7 (seven) days. 5 capsule 3 Past Week at Unknown time  . naloxone (NARCAN) 0.4 MG/ML injection As needed. (Patient taking differently: Place 0.4 mg into the nose as needed (overdose).) 1 mL 2     Allergies: Tape and Ketorolac Past Medical History:  Diagnosis Date  . Anxiety   . HIV (human immunodeficiency virus infection) (HCC)   . Proteinuria   . Sickle cell crisis (HCC)   . 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HCC)   . Vitamin D deficiency 10/2018   Past Surgical History:  Procedure Laterality Date  . IR IMAGING GUIDED PORT INSERTION  08/29/2019   Family History  Problem Relation Age of Onset  . Sickle cell trait Mother   . Sickle cell trait Father   . Birth defects Maternal Grandmother   .  Birth defects Paternal Grandmother    Social History   Socioeconomic History  . Marital status: Single    Spouse name: Not on file  . Number of children: Not on file  . Years of education: Not on file  . Highest education level: Not on file  Occupational History  . Not on file  Tobacco Use  . Smoking status: Never Smoker  . Smokeless tobacco: Never Used  Vaping Use  . Vaping Use: Some days  . Substances: THC  Substance and Sexual Activity  . Alcohol  use: No  . Drug use: Yes    Types: Marijuana  . Sexual activity: Yes    Birth control/protection: Condom  Other Topics Concern  . Not on file  Social History Narrative  . Not on file   Social Determinants of Health   Financial Resource Strain: Not on file  Food Insecurity: Not on file  Transportation Needs: Not on file  Physical Activity: Not on file  Stress: Not on file  Social Connections: Not on file  Intimate Partner Violence: Not on file    Review of Systems  Constitutional: Negative.   HENT: Negative.   Eyes: Negative.   Respiratory: Negative.   Cardiovascular: Negative.   Gastrointestinal: Negative.   Genitourinary: Negative.   Musculoskeletal: Positive for back pain and joint pain.  Skin: Negative.   Neurological: Negative.   Psychiatric/Behavioral: Negative.     Physical Exam: Blood pressure 119/80, pulse 78, temperature 98.1 F (36.7 C), temperature source Temporal, resp. rate 10, SpO2 92 %.  Physical Exam Constitutional:      Appearance: Normal appearance.  Eyes:     Pupils: Pupils are equal, round, and reactive to light.  Cardiovascular:     Rate and Rhythm: Normal rate and regular rhythm.     Pulses: Normal pulses.  Pulmonary:     Effort: Pulmonary effort is normal.  Abdominal:     General: Abdomen is flat. Bowel sounds are normal.  Musculoskeletal:        General: Normal range of motion.  Skin:    General: Skin is warm.  Neurological:     General: No focal deficit present.     Mental Status: He is alert. Mental status is at baseline.  Psychiatric:        Mood and Affect: Mood normal.        Thought Content: Thought content normal.        Judgment: Judgment normal.     Lab results: Results for orders placed or performed during the hospital encounter of 04/29/20 (from the past 24 hour(s))  Comprehensive metabolic panel     Status: Abnormal   Collection Time: 04/29/20  5:18 AM  Result Value Ref Range   Sodium 138 135 - 145 mmol/L    Potassium 3.8 3.5 - 5.1 mmol/L   Chloride 106 98 - 111 mmol/L   CO2 25 22 - 32 mmol/L   Glucose, Bld 86 70 - 99 mg/dL   BUN 14 6 - 20 mg/dL   Creatinine, Ser 2.50 0.61 - 1.24 mg/dL   Calcium 8.8 (L) 8.9 - 10.3 mg/dL   Total Protein 8.0 6.5 - 8.1 g/dL   Albumin 3.9 3.5 - 5.0 g/dL   AST 47 (H) 15 - 41 U/L   ALT 19 0 - 44 U/L   Alkaline Phosphatase 58 38 - 126 U/L   Total Bilirubin 9.7 (H) 0.3 - 1.2 mg/dL   GFR, Estimated >53 >97 mL/min   Anion gap  7 5 - 15  CBC with Differential     Status: Abnormal   Collection Time: 04/29/20  5:18 AM  Result Value Ref Range   WBC 13.2 (H) 4.0 - 10.5 K/uL   RBC 2.26 (L) 4.22 - 5.81 MIL/uL   Hemoglobin 8.3 (L) 13.0 - 17.0 g/dL   HCT 51.7 (L) 00.1 - 74.9 %   MCV 94.7 80.0 - 100.0 fL   MCH 36.7 (H) 26.0 - 34.0 pg   MCHC 38.8 (H) 30.0 - 36.0 g/dL   RDW 44.9 (H) 67.5 - 91.6 %   Platelets 277 150 - 400 K/uL   nRBC 8.3 (H) 0.0 - 0.2 %   Neutrophils Relative % 44 %   Neutro Abs 5.9 1.7 - 7.7 K/uL   Lymphocytes Relative 42 %   Lymphs Abs 5.5 (H) 0.7 - 4.0 K/uL   Monocytes Relative 10 %   Monocytes Absolute 1.3 (H) 0.1 - 1.0 K/uL   Eosinophils Relative 3 %   Eosinophils Absolute 0.4 0.0 - 0.5 K/uL   Basophils Relative 0 %   Basophils Absolute 0.0 0.0 - 0.1 K/uL   Immature Granulocytes 1 %   Abs Immature Granulocytes 0.07 0.00 - 0.07 K/uL   Agglutination PRESENT    Polychromasia PRESENT    Sickle Cells PRESENT    Target Cells PRESENT   Reticulocytes     Status: Abnormal   Collection Time: 04/29/20  5:18 AM  Result Value Ref Range   Retic Ct Pct 26.5 (H) 0.4 - 3.1 %   RBC. 2.24 (L) 4.22 - 5.81 MIL/uL   Retic Count, Absolute 542.5 (H) 19.0 - 186.0 K/uL   Immature Retic Fract 26.3 (H) 2.3 - 15.9 %    Imaging results:  DG Chest Port 1 View  Result Date: 04/29/2020 CLINICAL DATA:  Chest pain EXAM: PORTABLE CHEST 1 VIEW COMPARISON:  Radiograph 04/03/2020, CT 04/01/2020 FINDINGS: Accessed right IJ approach Port-A-Cath tip terminates at the  superior cavoatrial junction. Telemetry leads overlie the chest. There is some chronically coarsened interstitial and bronchitic features within the lungs. No clear superimposed acute abnormality is evident. No pneumothorax or effusion. Stable cardiomediastinal contours. No acute osseous or soft tissue abnormality. IMPRESSION: 1. Chronic interstitial and bronchitic features within the lungs. 2. No clear superimposed acute cardiopulmonary process is evident. Electronically Signed   By: Kreg Shropshire M.D.   On: 04/29/2020 05:43     Assessment & Plan: Patient admitted to sickle cell day infusion center for management of pain crisis.  Patient is opiate tolerant Initiate IV dilaudid PCA with settings of 0.5 mg, 10-minute lockout, and 3 mg/h IV fluids, 0.45% saline at 100 mL/h Tylenol 1000 mg by mouth times one dose Reviewed all laboratory values, do not warrant repeating at this time. Pain intensity will be reevaluated in context of functioning and relationship to baseline as care progresses If pain intensity remains elevated and/or sudden change in hemodynamic stability transition to inpatient services for higher level of care.     [name]  APRN, [name], [name] [facility] [id number] [place], [place] [address] [phone]   04/29/2020, 4:14 PM

## 2020-04-29 NOTE — ED Provider Notes (Signed)
[facility]-EMERGENCY DEPT Provider Note   CSN: [id number] Arrival date & time: 04/29/20  0421     History Chief Complaint  Patient presents with  . Sickle Cell Pain Crisis    Martin [name] is a 30 y.o. male.  Patient is a 30 year old male with past medical history of sickle cell disease, HIV disease, prior pulmonary embolism.  Patient presents today for evaluation of what he believes to be a sickle cell crisis.  He describes "pain all over", including his back, chest, arms, and legs.  He denies fevers or chills.  He denies productive cough.  The history is provided by the patient.  Sickle Cell Pain Crisis Severity:  Severe Onset quality:  Sudden Duration:  2 days Timing:  Constant Progression:  Worsening Chronicity:  Recurrent Relieved by:  Nothing Worsened by:  Nothing Ineffective treatments:  Prescription drugs      Past Medical History:  Diagnosis Date  . Anxiety   . HIV (human immunodeficiency virus infection) (HCC)   . Proteinuria   . Sickle cell crisis (HCC)   . 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HCC)   . Vitamin D deficiency 10/2018    Patient Active Problem List   Diagnosis Date Noted  . Pneumonia, viral 04/04/2020  . Acute and chronic respiratory failure with hypoxia (HCC) 04/03/2020  . 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/18/2020  . 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 Itching of male genitalia 02/22/2020  . Abnormal penile discharge, without blood 02/22/2020  . Leukocytosis 01/02/2020  . Anxiety 11/27/2019  . Proteinuria 11/27/2019  . Scleral icterus   . Chronic, continuous use of opioids 08/15/2019  . Seasonal allergies 08/15/2019  . Hypoxia   . Chest congestion   . Sickle cell anemia with crisis (HCC) 08/04/2019  . Other chronic pain 08/04/2019  . History of pulmonary embolus (PE) 08/04/2019  . Acute pulmonary embolism (HCC) 02/10/2019  . Acute chest syndrome due to sickle cell crisis (HCC) 02/10/2019  . Sickle cell crisis (HCC)  01/17/2019  . Heart murmur 02/03/2017  . Sickle cell disease with crisis (HCC) 02/02/2017  . 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 Sickle cell pain crisis (HCC) 02/02/2017  . 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HCC) 12/20/2016  . Vitamin D deficiency 08/14/2016  . High risk medication use 09/27/2014  . Generalized anxiety disorder 05/19/2014  . GERD (gastroesophageal reflux disease) 05/19/2014  . HIV (human immunodeficiency virus infection) (HCC) 05/23/2012    Past Surgical History:  Procedure Laterality Date  . IR IMAGING GUIDED PORT INSERTION  08/29/2019       Family History  Problem Relation Age of Onset  . Sickle cell trait Mother   . Sickle cell trait Father   . Birth defects Maternal Grandmother   . Birth defects Paternal Grandmother     Social History   Tobacco Use  . Smoking status: Never Smoker  . Smokeless tobacco: Never Used  Vaping Use  . Vaping Use: Some days  . Substances: THC  Substance Use Topics  . Alcohol use: No  . Drug use: Yes    Types: Marijuana    Home Medications Prior to Admission medications   Medication Sig Start Date End Date Taking? Authorizing Provider  abacavir-dolutegravir-lamiVUDine (TRIUMEQ) 600-50-300 MG tablet Take 1 tablet by mouth at bedtime.     [provider]  apixaban (ELIQUIS) 5 MG TABS tablet Take 1 tablet (5 mg total) by mouth 2 (two) times daily. 08/21/19   Massie Maroon, [name]  hydroxyurea (HYDREA) 500 MG  capsule Take 4 capsules (2,000 mg total) by mouth at bedtime. 12/08/19   [name], [name]  Multiple Vitamin (MULTIVITAMIN WITH MINERALS) TABS tablet Take 1 tablet by mouth daily with breakfast.     [provider]  naloxone (NARCAN) 0.4 MG/ML injection As needed. Patient taking differently: Place 0.4 mg into the nose as needed (overdose). 11/28/18   [name], [name]  oxyCODONE-acetaminophen (PERCOCET) 10-325 MG tablet Take 1 tablet by mouth every 4 (four) hours as needed for up to 15 days for pain.  04/17/20 05/02/20  [name], [name]  traZODone (DESYREL) 100 MG tablet Take 1 tablet (100 mg total) by mouth at bedtime as needed for sleep. 03/26/20   [name], [name]  Vitamin D, Ergocalciferol, (DRISDOL) 1.25 MG (50000 UT) CAPS capsule Take 1 capsule (50,000 Units total) by mouth every 7 (seven) days. 11/01/18   [name], [name]    Allergies    Tape and Ketorolac  Review of Systems   Review of Systems  All other systems reviewed and are negative.   Physical Exam Updated Vital Signs BP 105/67 (BP Location: Right Arm)   Pulse 93   Temp 98.3 F (36.8 C) (Oral)   Resp 15   Ht 6\' 3"  (1.905 m)   Wt 68 kg   SpO2 98%   BMI 18.75 kg/m   Physical Exam Vitals and nursing note reviewed.  Constitutional:      General: He is not in acute distress.    Appearance: He is well-developed and well-nourished. He is not diaphoretic.  HENT:     Head: Normocephalic and atraumatic.     Mouth/Throat:     Mouth: Oropharynx is clear and moist.  Cardiovascular:     Rate and Rhythm: Normal rate and regular rhythm.     Heart sounds: No murmur heard. No friction rub.  Pulmonary:     Effort: Pulmonary effort is normal. No respiratory distress.     Breath sounds: Normal breath sounds. No wheezing or rales.  Abdominal:     General: Bowel sounds are normal. There is no distension.     Palpations: Abdomen is soft.     Tenderness: There is no abdominal tenderness.  Musculoskeletal:        General: No edema. Normal range of motion.     Cervical back: Normal range of motion and neck supple.  Skin:    General: Skin is warm and dry.  Neurological:     Mental Status: He is alert and oriented to person, place, and time.     Coordination: Coordination normal.     ED Results / Procedures / Treatments   Labs (all labs ordered are listed, but only abnormal results are displayed) Labs Reviewed  COMPREHENSIVE METABOLIC PANEL  CBC WITH DIFFERENTIAL/PLATELET  RETICULOCYTES    EKG EKG  Interpretation  Date/Time:  Monday April 29 2020 05:03:56 EST Ventricular Rate:  71 PR Interval:    QRS Duration: 90 QT Interval:  423 QTC Calculation: 460 R Axis:   82 Text Interpretation: Sinus rhythm LVH by voltage No significant change since 01/29/2020 Confirmed by 03/30/2020 (Geoffery Lyons) on 04/29/2020 5:24:56 AM   Radiology No results found.  Procedures Procedures (including critical care time)  Medications Ordered in ED Medications  HYDROmorphone (DILAUDID) injection 2 mg (has no administration in time range)  diphenhydrAMINE (BENADRYL) injection 25 mg (has no administration in time range)  sodium chloride 0.9 % bolus 1,000 mL (has no administration in time range)  ED Course  I have reviewed the triage vital signs and the nursing notes.  Pertinent labs & imaging results that were available during my care of the patient were reviewed by me and considered in my medical decision making (see chart for details).    MDM Rules/Calculators/A&P  Patient presenting here with complaints of pain in his back and legs.  He has history of sickle cell disease and this feels to him like a crisis.  Patient's laboratory studies are consistent with baseline.  He was given pain medication.  Patient to be reassessed for pain control.  Care to be signed out to Dr. Effie Shy at shift change.  He will reassess the patient and determine the final disposition.  Final Clinical Impression(s) / ED Diagnoses Final diagnoses:  None    Rx / DC Orders ED Discharge Orders    None       Geoffery Lyons, MD 04/30/20 [phone]

## 2020-04-29 NOTE — ED Provider Notes (Signed)
At this time he remains uncomfortable and wold like to go to the sickle cell day hospital.  I have communicated this to [place] Hollis who accepts him.  We talked about his significant oxygen desaturation to the 70s on room air.  He does not have a clear diagnosis for requirement of oxygen use with only history of "bronchitis."  Ms. Hart Rochester states that she will look into his pulmonary status.   Mancel Bale, MD 04/29/20 [phone]

## 2020-04-29 NOTE — Discharge Summary (Signed)
[facility] Discharge Summary   Patient ID: Martin [name] MRN: [id number] DOB/AGE: 31-29-1992 30 y.o.  Admit date: 04/29/2020 Discharge date: 04/29/2020  Primary Care Physician:  [name], [name]  Admission Diagnoses:  Active Problems:   Sickle cell pain crisis [facility])   Discharge Medications:  Allergies as of 04/29/2020      Reactions   Tape Rash, Other (See Comments)   PLEASE DO NOT USE THE CLEAR, THICK, "PLASTIC" TAPE- only paper tape is tolerated    Ketorolac Swelling, Other (See Comments)   Patient reports facial edema and left arm edema after administration.      Medication List    TAKE these medications   apixaban 5 MG Tabs tablet Commonly known as: Eliquis Take 1 tablet (5 mg total) by mouth 2 (two) times daily.   hydroxyurea 500 MG capsule Commonly known as: HYDREA Take 4 capsules (2,000 mg total) by mouth at bedtime.   multivitamin with minerals Tabs tablet Take 1 tablet by mouth daily with breakfast.   naloxone 0.4 MG/ML injection Commonly known as: NARCAN As needed. What changed:   how much to take  how to take this  when to take this  reasons to take this  additional instructions   oxyCODONE-acetaminophen 10-325 MG tablet Commonly known as: PERCOCET Take 1 tablet by mouth every 4 (four) hours as needed for up to 15 days for pain.   traZODone 100 MG tablet Commonly known as: DESYREL Take 1 tablet (100 mg total) by mouth at bedtime as needed for sleep.   Triumeq 600-50-300 MG tablet Generic drug: abacavir-dolutegravir-lamiVUDine Take 1 tablet by mouth at bedtime.   Vitamin D (Ergocalciferol) 1.25 MG (50000 UNIT) Caps capsule Commonly known as: DRISDOL Take 1 capsule (50,000 Units total) by mouth every 7 (seven) days.        Consults:  None  Significant Diagnostic Studies:  DG Chest 2 View  Result Date: 04/01/2020 CLINICAL DATA:  30 year old male with hypoxia, productive cough for 2 days. Sickle cell  disease. HIV. EXAM: CHEST - 2 VIEW COMPARISON:  Chest radiographs 03/17/2020 and earlier. FINDINGS: Stable right chest power port. Stable lung volumes and mediastinal contours. The chronic bilateral increased pulmonary interstitial markings, stable since last month. Small peripheral left upper lobe lung nodule appears stable since August. No superimposed pneumothorax, pleural effusion or acute pulmonary opacity. Visualized tracheal air column is within normal limits. Negative visible bowel gas pattern. No acute osseous abnormality identified. IMPRESSION: Stable chronic interstitial lung changes. No acute cardiopulmonary abnormality. Electronically Signed   By: Odessa Fleming M.D.   On: 04/01/2020 08:05   CT Angio Chest PE W/Cm &/Or Wo Cm  Result Date: 04/01/2020 CLINICAL DATA:  30 year old male with sickle cell pain onset today. Pain radiating to the back. EXAM: CT ANGIOGRAPHY CHEST WITH CONTRAST TECHNIQUE: Multidetector CT imaging of the chest was performed using the standard protocol during bolus administration of intravenous contrast. Multiplanar CT image reconstructions and MIPs were obtained to evaluate the vascular anatomy. CONTRAST:  47mL OMNIPAQUE IOHEXOL 350 MG/ML SOLN COMPARISON:  Chest radiographs earlier today.  CTA chest 02/10/2019. FINDINGS: Cardiovascular: Adequate contrast bolus timing in the pulmonary arterial tree. No focal filling defect identified in the pulmonary arteries to suggest acute pulmonary embolism. Restored patency to the right lower lobe branches where nonocclusive thrombus identified last year. Stable mild cardiomegaly. No pericardial effusion. Negative visible aorta. Right chest Port-A-Cath remains in place. Mediastinum/Nodes: Smaller volume of residual thymus compared to last year. Prominent bilateral hilar  lymph nodes are stable. Similar symmetric chronic axillary lymph node prominence has mildly increased since last year. But no suspicious lymphadenopathy. Lungs/Pleura: Major  airways are patent. Mild subpleural scarring now at the site of left upper lobe pulmonary opacity last year. Superimposed bilateral chronic basilar predominant increased interstitial markings, mild areas of architectural distortion and mosaic attenuation are stable. No pleural effusion or acute pulmonary opacity identified. Upper Abdomen: Atrophied, calcified spleen. Negative visible liver, pancreas, adrenal glands, kidneys and bowel in the upper abdomen. Musculoskeletal: Course bone mineralization appears stable in the thorax. No acute osseous abnormality identified. Review of the MIP images confirms the above findings. IMPRESSION: 1. No evidence of acute pulmonary embolus. 2. No acute pulmonary finding. Scarring has developed in the peripheral left upper lobe at the site of abnormal 2020 lung opacity. Elsewhere stable lower lobe predominant scarring. 3. Inflammatory appearing lymph nodes in the bilateral chest have mildly increased since last year. 4. Other sequelae of sickle cell disease including calcified splenic atrophy. Electronically Signed   By: Odessa Fleming M.D.   On: 04/01/2020 12:23   DG Chest Port 1 View  Result Date: 04/29/2020 CLINICAL DATA:  Chest pain EXAM: PORTABLE CHEST 1 VIEW COMPARISON:  Radiograph 04/03/2020, CT 04/01/2020 FINDINGS: Accessed right IJ approach Port-A-Cath tip terminates at the superior cavoatrial junction. Telemetry leads overlie the chest. There is some chronically coarsened interstitial and bronchitic features within the lungs. No clear superimposed acute abnormality is evident. No pneumothorax or effusion. Stable cardiomediastinal contours. No acute osseous or soft tissue abnormality. IMPRESSION: 1. Chronic interstitial and bronchitic features within the lungs. 2. No clear superimposed acute cardiopulmonary process is evident. Electronically Signed   By: Kreg Shropshire M.D.   On: 04/29/2020 05:43   DG Chest Port 1 View  Result Date: 04/03/2020 CLINICAL DATA:  Sickle cell.   Chest pain. EXAM: PORTABLE CHEST 1 VIEW COMPARISON:  04/01/2020 FINDINGS: Heart size upper normal. Mild prominence of the pulmonary vascularity unchanged. Prominent lung markings diffusely compatible with scarring unchanged. No acute infiltrate or effusion. Port-A-Cath tip at the cavoatrial junction unchanged. IMPRESSION: Prominent lung markings appear chronic and stable. No superimposed acute abnormality. Electronically Signed   By: Marlan Palau M.D.   On: 04/03/2020 10:44    History of present illness: Rudy Heffron is a 30 year old male with a medical history significant for sickle cell disease, chronic pain syndrome, opiate dependence and tolerance, history of HIV disease, and history of PE on Eliquis presents complaining of low back, upper, and lower extremity pain over the past several days that is consistent with previous sickle cell pain crisis.  Patient attributes current pain crisis to changes in weather.  He states that he has been in [place] over the past 3 weeks and pain started when he returned 2 days ago.  He has been taking Percocet 10-325 mg without sustained relief, his last dose was this a.m.  He rates pain as 9/10 characterized as constant and throbbing.  Patient was treated and evaluated in the emergency room this a.m. without complete resolution.  Agree with the ER provider that patient is appropriate to transition to sickle cell day clinic for pain management and extended observation.  Patient denies any fever, chills, chest pain, urinary symptoms, nausea, vomiting, or diarrhea.  No sick contacts, recent travel, or exposure to COVID-19.   [facility] Course: Patient admitted to sickle cell day infusion center for management of pain crisis. Reviewed all laboratory values, largely consistent with patient's baseline and  did not warrant repeating. Pain managed with IV Dilaudid via PCA with settings of 0.5 mg, 10-minute lockout, and 3 mg/h. Tylenol 1000 mg x 1 IV  fluids, 0.45% saline at 100 mL/h Pain intensity decreased to 5/10.  Patient advised to return to clinic in a.m. if symptoms persist.  He is alert, oriented, and ambulating without assistance.  Patient will discharge home in a hemodynamically stable condition.  Discharge instructions: Resume all home medications.   Follow up with PCP as previously  scheduled.   Discussed the importance of drinking 64 ounces of water daily, dehydration of red blood cells may lead further sickling.   Avoid all stressors that precipitate sickle cell pain crisis.     The patient was given clear instructions to go to ER or return to medical center if symptoms do not improve, worsen or new problems develop.    Physical Exam at Discharge:  BP 119/80 (BP Location: Right Arm)   Pulse 78   Temp 98.1 F (36.7 C) (Temporal)   Resp 10   SpO2 92%   Physical Exam Constitutional:      Appearance: Normal appearance.  HENT:     Mouth/Throat:     Mouth: Mucous membranes are moist.  Eyes:     Pupils: Pupils are equal, round, and reactive to light.  Cardiovascular:     Rate and Rhythm: Normal rate and regular rhythm.     Pulses: Normal pulses.  Pulmonary:     Effort: Pulmonary effort is normal.  Abdominal:     General: Abdomen is flat. Bowel sounds are normal.  Musculoskeletal:        General: Normal range of motion.  Skin:    General: Skin is warm.  Neurological:     General: No focal deficit present.     Mental Status: He is alert. Mental status is at baseline.  Psychiatric:        Mood and Affect: Mood normal.        Behavior: Behavior normal.        Thought Content: Thought content normal.        Judgment: Judgment normal.      Disposition at Discharge: Discharge disposition: 01-Home or Self Care       Discharge Orders: Discharge Instructions    Discharge patient   Complete by: As directed    Discharge disposition: 01-Home or Self Care   Discharge patient date: 04/29/2020       Condition at Discharge:   Stable  Time spent on Discharge:  Greater than 30 minutes.  Signed: [name]  APRN, [name], [name] Patient Care [facility] [address] [place], [place] [address] [phone]  04/29/2020, 4:08 PM

## 2020-04-29 NOTE — ED Triage Notes (Signed)
Pt reports Sickle Cell Crisis pain. Pain 10/10. Pain worse at shoulders and lower back. 02 at 81% in triage.

## 2020-04-29 NOTE — Telephone Encounter (Signed)
Patient called from the ER wanting to come to the [facility] for continual treatment. The provider said it's okay for the patient to come after discharge from the ER. Patient is coming this way now.

## 2020-04-30 ENCOUNTER — Encounter (HOSPITAL_COMMUNITY): Payer: Self-pay | Admitting: Family Medicine

## 2020-04-30 ENCOUNTER — Non-Acute Institutional Stay (HOSPITAL_COMMUNITY)
Admission: AD | Admit: 2020-04-30 | Discharge: 2020-04-30 | Disposition: A | Discharge status: Home / Self Care | Payer: Medicaid Other | Source: Ambulatory Visit | Attending: Internal Medicine | Admitting: Internal Medicine

## 2020-04-30 DIAGNOSIS — Z7901 Long term (current) use of anticoagulants: Secondary | ICD-10-CM | POA: Diagnosis not present

## 2020-04-30 DIAGNOSIS — D5700 Hb-SS disease with crisis, unspecified: Secondary | ICD-10-CM | POA: Diagnosis not present

## 2020-04-30 DIAGNOSIS — Z888 Allergy status to other drugs, medicaments and biological substances status: Secondary | ICD-10-CM | POA: Insufficient documentation

## 2020-04-30 DIAGNOSIS — Z86711 Personal history of pulmonary embolism: Secondary | ICD-10-CM | POA: Diagnosis not present

## 2020-04-30 DIAGNOSIS — Z79899 Other long term (current) drug therapy: Secondary | ICD-10-CM | POA: Insufficient documentation

## 2020-04-30 DIAGNOSIS — G894 Chronic pain syndrome: Secondary | ICD-10-CM | POA: Insufficient documentation

## 2020-04-30 DIAGNOSIS — F1120 Opioid dependence, uncomplicated: Secondary | ICD-10-CM | POA: Diagnosis not present

## 2020-04-30 DIAGNOSIS — Z21 Asymptomatic human immunodeficiency virus [HIV] infection status: Secondary | ICD-10-CM | POA: Diagnosis not present

## 2020-04-30 MED ORDER — DIPHENHYDRAMINE HCL 25 MG PO CAPS: 25.0000 mg | ORAL_CAPSULE | ORAL | Status: DC | PRN

## 2020-04-30 MED ORDER — SODIUM CHLORIDE 0.9% FLUSH: 9.0000 mL | INTRAVENOUS | Status: DC | PRN

## 2020-04-30 MED ORDER — ONDANSETRON HCL 4 MG/2ML IJ SOLN: 4.0000 mg | Freq: Four times a day (QID) | INTRAMUSCULAR | Status: DC | PRN

## 2020-04-30 MED ORDER — SODIUM CHLORIDE 0.45 % IV SOLN: INTRAVENOUS | Status: DC

## 2020-04-30 MED ORDER — ACETAMINOPHEN 500 MG PO TABS
1000.0000 mg | ORAL_TABLET | Freq: Once | ORAL | Status: AC
  Administered 2020-04-30: 1000 mg via ORAL
  Filled 2020-04-30: qty 2

## 2020-04-30 MED ORDER — SODIUM CHLORIDE 0.9% FLUSH
10.0000 mL | INTRAVENOUS | Status: DC | PRN
  Administered 2020-04-30: 10 mL

## 2020-04-30 MED ORDER — HEPARIN SOD (PORK) LOCK FLUSH 100 UNIT/ML IV SOLN
500.0000 [IU] | INTRAVENOUS | Status: DC | PRN
  Filled 2020-04-30: qty 5

## 2020-04-30 MED ORDER — NALOXONE HCL 0.4 MG/ML IJ SOLN: 0.4000 mg | INTRAMUSCULAR | Status: DC | PRN

## 2020-04-30 MED ORDER — HYDROMORPHONE 1 MG/ML IV SOLN
INTRAVENOUS | Status: DC
  Administered 2020-04-30: 30 mg via INTRAVENOUS
  Administered 2020-04-30: 17.5 mg via INTRAVENOUS
  Filled 2020-04-30: qty 30

## 2020-04-30 NOTE — H&P (Signed)
[facility] History and Physical   Date: 04/30/2020  Patient name: Martin [name] Medical record number: [id number] Date of birth: [birth year] Age: 30 y.o. Gender: male PCP: [name], [name]  Attending physician: Quentin Angst, MD  Chief Complaint: Sickle cell pain   History of Present Illness: Alfonsa Mottola is a 30 year old male with a medical history significant for sickle cell disease, chronic pain syndrome, opiate dependence and tolerance, history of HIV disease, and history of PE on Eliquis presents with complaints of low back, upper and lower extremity pain over the past several days that is consistent with his previous sickle cell pain crisis.  Patient was treated and evaluated at the sickle cell day clinic on 04/29/2020 without complete resolution.  He was advised to return to clinic for further treatment of sickle cell pain crisis if pain persisted overnight.  Patient says that pain intensity is 10/10 characterized as constant and throbbing.  Patient last had Percocet 10-325 mg this a.m. without sustained relief.  He denies any fever, chills, headache, cough, urinary symptoms, nausea, vomiting, or diarrhea.  No sick contacts, recent travel, or exposure to COVID-19.  Meds: Medications Prior to Admission  Medication Sig Dispense Refill Last Dose  . abacavir-dolutegravir-lamiVUDine (TRIUMEQ) 600-50-300 MG tablet Take 1 tablet by mouth at bedtime.      Marland Kitchen apixaban (ELIQUIS) 5 MG TABS tablet Take 1 tablet (5 mg total) by mouth 2 (two) times daily. 60 tablet 5   . hydroxyurea (HYDREA) 500 MG capsule Take 4 capsules (2,000 mg total) by mouth at bedtime. 120 capsule 11   . Multiple Vitamin (MULTIVITAMIN WITH MINERALS) TABS tablet Take 1 tablet by mouth daily with breakfast.      . naloxone (NARCAN) 0.4 MG/ML injection As needed. (Patient taking differently: Place 0.4 mg into the nose as needed (overdose).) 1 mL 2   . oxyCODONE-acetaminophen (PERCOCET) 10-325  MG tablet Take 1 tablet by mouth every 4 (four) hours as needed for up to 15 days for pain. 90 tablet 0   . traZODone (DESYREL) 100 MG tablet Take 1 tablet (100 mg total) by mouth at bedtime as needed for sleep. 90 tablet 3   . Vitamin D, Ergocalciferol, (DRISDOL) 1.25 MG (50000 UT) CAPS capsule Take 1 capsule (50,000 Units total) by mouth every 7 (seven) days. 5 capsule 3     Allergies: Tape and Ketorolac Past Medical History:  Diagnosis Date  . Anxiety   . HIV (human immunodeficiency virus infection) (HCC)   . Proteinuria   . Sickle cell crisis (HCC)   . 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HCC)   . Vitamin D deficiency 10/2018   Past Surgical History:  Procedure Laterality Date  . IR IMAGING GUIDED PORT INSERTION  08/29/2019   Family History  Problem Relation Age of Onset  . Sickle cell trait Mother   . Sickle cell trait Father   . Birth defects Maternal Grandmother   . Birth defects Paternal Grandmother    Social History   Socioeconomic History  . Marital status: Single    Spouse name: Not on file  . Number of children: Not on file  . Years of education: Not on file  . Highest education level: Not on file  Occupational History  . Not on file  Tobacco Use  . Smoking status: Never Smoker  . Smokeless tobacco: Never Used  Vaping Use  . Vaping Use: Some days  . Substances: THC  Substance and Sexual Activity  . Alcohol use: No  .  Drug use: Yes    Types: Marijuana  . Sexual activity: Yes    Birth control/protection: Condom  Other Topics Concern  . Not on file  Social History Narrative  . Not on file   Social Determinants of Health   Financial Resource Strain: Not on file  Food Insecurity: Not on file  Transportation Needs: Not on file  Physical Activity: Not on file  Stress: Not on file  Social Connections: Not on file  Intimate Partner Violence: Not on file   Review of Systems  Constitutional: Negative for chills and fever.  HENT: Negative.   Eyes: Negative.    Respiratory: Negative.   Cardiovascular: Negative.   Genitourinary: Negative.   Musculoskeletal: Positive for back pain and joint pain.  Skin: Negative.   Neurological: Negative.   Endo/Heme/Allergies: Negative.   Psychiatric/Behavioral: Negative.     Physical Exam: There were no vitals taken for this visit. Physical Exam Constitutional:      Appearance: Normal appearance.  Eyes:     Pupils: Pupils are equal, round, and reactive to light.  Cardiovascular:     Rate and Rhythm: Normal rate.  Pulmonary:     Effort: Pulmonary effort is normal.  Abdominal:     General: Abdomen is flat. Bowel sounds are normal.  Musculoskeletal:        General: Normal range of motion.  Skin:    General: Skin is warm.  Neurological:     General: No focal deficit present.     Mental Status: He is alert. Mental status is at baseline.  Psychiatric:        Mood and Affect: Mood normal.        Thought Content: Thought content normal.        Judgment: Judgment normal.      Lab results: No results found for this or any previous visit (from the past 24 hour(s)).  Imaging results:  DG Chest Port 1 View  Result Date: 04/29/2020 CLINICAL DATA:  Chest pain EXAM: PORTABLE CHEST 1 VIEW COMPARISON:  Radiograph 04/03/2020, CT 04/01/2020 FINDINGS: Accessed right IJ approach Port-A-Cath tip terminates at the superior cavoatrial junction. Telemetry leads overlie the chest. There is some chronically coarsened interstitial and bronchitic features within the lungs. No clear superimposed acute abnormality is evident. No pneumothorax or effusion. Stable cardiomediastinal contours. No acute osseous or soft tissue abnormality. IMPRESSION: 1. Chronic interstitial and bronchitic features within the lungs. 2. No clear superimposed acute cardiopulmonary process is evident. Electronically Signed   By: Kreg Shropshire M.D.   On: 04/29/2020 05:43     Assessment & Plan: Patient admitted to sickle cell day infusion center for  management of pain crisis.  Patient is opiate tolerant Initiate IV dilaudid PCA. Settings of 0.5 mg, 10 minute lockout, and 3 mg/hr IV fluids, 0.45% saline at 100 ml/hr Toradol 15 mg IV times one dose Tylenol 1000 mg by mouth times one dose Reviewed previous laboratory values, consistent with patient's baseline.  Labs do not warrant repeating today.  Pain intensity will be reevaluated in context of functioning and relationship to baseline as care progresses If pain intensity remains elevated and/or sudden change in hemodynamic stability transition to inpatient services for higher level of care.      [name]  APRN, [name], [name] Patient Care [facility] [address] [place], [place] [address] [phone]  04/30/2020, 8:52 AM

## 2020-04-30 NOTE — Progress Notes (Signed)
Patient admitted to the day hospital for treatment of sickle cell pain crisis. Patient reported generalized pain rated 8/10. Patient placed on Dilaudid PCA, given PO tylenol and hydrated with IV fluids. At discharge patient reported  pain at 3/10. Declined printed AVS. Alert, oriented and ambulatory at discharge.

## 2020-04-30 NOTE — Discharge Summary (Signed)
[facility] Discharge Summary   Patient ID: Martin [name] MRN: [id number] DOB/AGE: 1990/10/26 30 y.o.  Admit date: 04/30/2020 Discharge date: 04/30/2020  Primary Care Physician:  [name], [name]  Admission Diagnoses:  Active Problems:   Sickle cell pain crisis [facility])   Discharge Medications:  Allergies as of 04/30/2020      Reactions   Tape Rash, Other (See Comments)   PLEASE DO NOT USE THE CLEAR, THICK, "PLASTIC" TAPE- only paper tape is tolerated    Ketorolac Swelling, Other (See Comments)   Patient reports facial edema and left arm edema after administration.      Medication List    TAKE these medications   apixaban 5 MG Tabs tablet Commonly known as: Eliquis Take 1 tablet (5 mg total) by mouth 2 (two) times daily.   hydroxyurea 500 MG capsule Commonly known as: HYDREA Take 4 capsules (2,000 mg total) by mouth at bedtime.   multivitamin with minerals Tabs tablet Take 1 tablet by mouth daily with breakfast.   naloxone 0.4 MG/ML injection Commonly known as: NARCAN As needed. What changed:   how much to take  how to take this  when to take this  reasons to take this  additional instructions   oxyCODONE-acetaminophen 10-325 MG tablet Commonly known as: PERCOCET Take 1 tablet by mouth every 4 (four) hours as needed for up to 15 days for pain.   traZODone 100 MG tablet Commonly known as: DESYREL Take 1 tablet (100 mg total) by mouth at bedtime as needed for sleep.   Triumeq 600-50-300 MG tablet Generic drug: abacavir-dolutegravir-lamiVUDine Take 1 tablet by mouth at bedtime.   Vitamin D (Ergocalciferol) 1.25 MG (50000 UNIT) Caps capsule Commonly known as: DRISDOL Take 1 capsule (50,000 Units total) by mouth every 7 (seven) days.        Consults:  None  Significant Diagnostic Studies:  DG Chest 2 View  Result Date: 04/01/2020 CLINICAL DATA:  30 year old male with hypoxia, productive cough for 2 days. Sickle cell  disease. HIV. EXAM: CHEST - 2 VIEW COMPARISON:  Chest radiographs 03/17/2020 and earlier. FINDINGS: Stable right chest power port. Stable lung volumes and mediastinal contours. The chronic bilateral increased pulmonary interstitial markings, stable since last month. Small peripheral left upper lobe lung nodule appears stable since August. No superimposed pneumothorax, pleural effusion or acute pulmonary opacity. Visualized tracheal air column is within normal limits. Negative visible bowel gas pattern. No acute osseous abnormality identified. IMPRESSION: Stable chronic interstitial lung changes. No acute cardiopulmonary abnormality. Electronically Signed   By: Odessa Fleming M.D.   On: 04/01/2020 08:05   CT Angio Chest PE W/Cm &/Or Wo Cm  Result Date: 04/01/2020 CLINICAL DATA:  30 year old male with sickle cell pain onset today. Pain radiating to the back. EXAM: CT ANGIOGRAPHY CHEST WITH CONTRAST TECHNIQUE: Multidetector CT imaging of the chest was performed using the standard protocol during bolus administration of intravenous contrast. Multiplanar CT image reconstructions and MIPs were obtained to evaluate the vascular anatomy. CONTRAST:  22mL OMNIPAQUE IOHEXOL 350 MG/ML SOLN COMPARISON:  Chest radiographs earlier today.  CTA chest 02/10/2019. FINDINGS: Cardiovascular: Adequate contrast bolus timing in the pulmonary arterial tree. No focal filling defect identified in the pulmonary arteries to suggest acute pulmonary embolism. Restored patency to the right lower lobe branches where nonocclusive thrombus identified last year. Stable mild cardiomegaly. No pericardial effusion. Negative visible aorta. Right chest Port-A-Cath remains in place. Mediastinum/Nodes: Smaller volume of residual thymus compared to last year. Prominent bilateral hilar  lymph nodes are stable. Similar symmetric chronic axillary lymph node prominence has mildly increased since last year. But no suspicious lymphadenopathy. Lungs/Pleura: Major  airways are patent. Mild subpleural scarring now at the site of left upper lobe pulmonary opacity last year. Superimposed bilateral chronic basilar predominant increased interstitial markings, mild areas of architectural distortion and mosaic attenuation are stable. No pleural effusion or acute pulmonary opacity identified. Upper Abdomen: Atrophied, calcified spleen. Negative visible liver, pancreas, adrenal glands, kidneys and bowel in the upper abdomen. Musculoskeletal: Course bone mineralization appears stable in the thorax. No acute osseous abnormality identified. Review of the MIP images confirms the above findings. IMPRESSION: 1. No evidence of acute pulmonary embolus. 2. No acute pulmonary finding. Scarring has developed in the peripheral left upper lobe at the site of abnormal 2020 lung opacity. Elsewhere stable lower lobe predominant scarring. 3. Inflammatory appearing lymph nodes in the bilateral chest have mildly increased since last year. 4. Other sequelae of sickle cell disease including calcified splenic atrophy. Electronically Signed   By: Odessa Fleming M.D.   On: 04/01/2020 12:23   DG Chest Port 1 View  Result Date: 04/29/2020 CLINICAL DATA:  Chest pain EXAM: PORTABLE CHEST 1 VIEW COMPARISON:  Radiograph 04/03/2020, CT 04/01/2020 FINDINGS: Accessed right IJ approach Port-A-Cath tip terminates at the superior cavoatrial junction. Telemetry leads overlie the chest. There is some chronically coarsened interstitial and bronchitic features within the lungs. No clear superimposed acute abnormality is evident. No pneumothorax or effusion. Stable cardiomediastinal contours. No acute osseous or soft tissue abnormality. IMPRESSION: 1. Chronic interstitial and bronchitic features within the lungs. 2. No clear superimposed acute cardiopulmonary process is evident. Electronically Signed   By: Kreg Shropshire M.D.   On: 04/29/2020 05:43   DG Chest Port 1 View  Result Date: 04/03/2020 CLINICAL DATA:  Sickle cell.   Chest pain. EXAM: PORTABLE CHEST 1 VIEW COMPARISON:  04/01/2020 FINDINGS: Heart size upper normal. Mild prominence of the pulmonary vascularity unchanged. Prominent lung markings diffusely compatible with scarring unchanged. No acute infiltrate or effusion. Port-A-Cath tip at the cavoatrial junction unchanged. IMPRESSION: Prominent lung markings appear chronic and stable. No superimposed acute abnormality. Electronically Signed   By: Marlan Palau M.D.   On: 04/03/2020 10:44    History of present illness:  Yuma Bhakta is a 30 year old male with a medical history significant for sickle cell disease, chronic pain syndrome, opiate dependence and tolerance, history of HIV disease, and history of PE on Eliquis presents with complaints of low back, upper and lower extremity pain over the past several days that is consistent with his previous sickle cell pain crisis.  Patient was treated and evaluated at the sickle cell day clinic on 04/29/2020 without complete resolution.  He was advised to return to clinic for further treatment of sickle cell pain crisis if pain persisted overnight.  Patient says that pain intensity is 10/10 characterized as constant and throbbing.  Patient last had Percocet 10-325 mg this a.m. without sustained relief.  He denies any fever, chills, headache, cough, urinary symptoms, nausea, vomiting, or diarrhea.  No sick contacts, recent travel, or exposure to COVID-19.  [facility] Course: Patient admitted to sickle cell day clinic for further management of pain crisis. Pain managed with IV Dilaudid via PCA with settings of 0.5 mg, 10-minute lockout, and 3 mg/h Tylenol 1000 mg x 1 IV fluids, 0.45% saline at 100 mL/h. Pain intensity decreased to 5/10.  Patient is requesting discharge home.  He is alert, oriented, and ambulating  without assistance. Patient was discharged home in a hemodynamically stable condition.  Discharge instructions: Resume all home medications.    Follow up with PCP as previously  scheduled.   Discussed the importance of drinking 64 ounces of water daily, dehydration of red blood cells may lead further sickling.   Avoid all stressors that precipitate sickle cell pain crisis.     The patient was given clear instructions to go to ER or return to medical center if symptoms do not improve, worsen or new problems develop.     Physical Exam at Discharge:  BP 106/69 (BP Location: Right Arm)   Pulse 77   Temp 98.2 F (36.8 C) (Temporal)   Resp 10   SpO2 91%   Physical Exam Constitutional:      Appearance: Normal appearance.  Eyes:     Pupils: Pupils are equal, round, and reactive to light.  Cardiovascular:     Rate and Rhythm: Normal rate and regular rhythm.     Pulses: Normal pulses.     Heart sounds: Normal heart sounds.  Pulmonary:     Effort: Pulmonary effort is normal.     Breath sounds: Normal breath sounds.  Abdominal:     General: Abdomen is flat. Bowel sounds are normal.  Musculoskeletal:        General: Normal range of motion.  Skin:    General: Skin is warm.  Neurological:     General: No focal deficit present.     Mental Status: He is alert. Mental status is at baseline.  Psychiatric:        Mood and Affect: Mood normal.        Thought Content: Thought content normal.        Judgment: Judgment normal.       Disposition at Discharge: Discharge disposition: 01-Home or Self Care       Discharge Orders: Discharge Instructions    Discharge patient   Complete by: As directed    Discharge disposition: 01-Home or Self Care   Discharge patient date: 04/30/2020      Condition at Discharge:   Stable  Time spent on Discharge:  Greater than 30 minutes.  Signed: [name]  APRN, [name], [name] [facility] [address] [place], [place] [address] [phone]  04/30/2020, 4:10 PM

## 2020-04-30 NOTE — Discharge Instructions (Signed)
Sickle Cell Anemia, Adult  Sickle cell anemia is a condition where your red blood cells are shaped like sickles. Red blood cells carry oxygen through the body. Sickle-shaped cells do not live as long as normal red blood cells. They also clump together and block blood from flowing through the blood vessels. This prevents the body from getting enough oxygen. Sickle cell anemia causes organ damage and pain. It also increases the risk of infection. Follow these instructions at home: Medicines  Take over-the-counter and prescription medicines only as told by your doctor.  If you were prescribed an antibiotic medicine, take it as told by your doctor. Do not stop taking the antibiotic even if you start to feel better.  If you develop a fever, do not take medicines to lower the fever right away. Tell your doctor about the fever. Managing pain, stiffness, and swelling  Try these methods to help with pain: ? Use a heating pad. ? Take a warm bath. ? Distract yourself, such as by watching TV. Eating and drinking  Drink enough fluid to keep your pee (urine) clear or pale yellow. Drink more in hot weather and during exercise.  Limit or avoid alcohol.  Eat a healthy diet. Eat plenty of fruits, vegetables, whole grains, and lean protein.  Take vitamins and supplements as told by your doctor. Traveling  When traveling, keep these with you: ? Your medical information. ? The names of your doctors. ? Your medicines.  If you need to take an airplane, talk to your doctor first. Activity  Rest often.  Avoid exercises that make your heart beat much faster, such as jogging. General instructions  Do not use products that have nicotine or tobacco, such as cigarettes and e-cigarettes. If you need help quitting, ask your doctor.  Consider wearing a medical alert bracelet.  Avoid being in high places (high altitudes), such as mountains.  Avoid very hot or cold temperatures.  Avoid places where the  temperature changes a lot.  Keep all follow-up visits as told by your doctor. This is important. Contact a doctor if:  A joint hurts.  Your feet or hands hurt or swell.  You feel tired (fatigued). Get help right away if:  You have symptoms of infection. These include: ? Fever. ? Chills. ? Being very tired. ? Irritability. ? Poor eating. ? Throwing up (vomiting).  You feel dizzy or faint.  You have new stomach pain, especially on the left side.  You have a an erection (priapism) that lasts more than 4 hours.  You have numbness in your arms or legs.  You have a hard time moving your arms or legs.  You have trouble talking.  You have pain that does not go away when you take medicine.  You are short of breath.  You are breathing fast.  You have a long-term cough.  You have pain in your chest.  You have a bad headache.  You have a stiff neck.  Your stomach looks bloated even though you did not eat much.  Your skin is pale.  You suddenly cannot see well. Summary  Sickle cell anemia is a condition where your red blood cells are shaped like sickles.  Follow your doctor's advice on ways to manage pain, food to eat, activities to do, and steps to take for safe travel.  Get medical help right away if you have any signs of infection, such as a fever. This information is not intended to replace advice given to you by   your health care provider. Make sure you discuss any questions you have with your health care provider. Document Revised: 08/31/2019 Document Reviewed: 08/31/2019 Elsevier Patient Education  2021 [employer] Inc.  

## 2020-05-02 ENCOUNTER — Telehealth: Payer: Self-pay | Admitting: Family Medicine

## 2020-05-03 ENCOUNTER — Other Ambulatory Visit: Payer: Self-pay | Admitting: Family Medicine

## 2020-05-03 DIAGNOSIS — G8929 Other chronic pain: Secondary | ICD-10-CM

## 2020-05-03 DIAGNOSIS — D571 Sickle-cell disease without crisis: Secondary | ICD-10-CM

## 2020-05-03 MED ORDER — OXYCODONE-ACETAMINOPHEN 10-325 MG PO TABS: 1.0000 | ORAL_TABLET | ORAL | 0 refills | Status: DC | PRN

## 2020-05-03 NOTE — Telephone Encounter (Signed)
Done

## 2020-05-09 ENCOUNTER — Non-Acute Institutional Stay (HOSPITAL_COMMUNITY)
Admission: AD | Admit: 2020-05-09 | Discharge: 2020-05-09 | Disposition: A | Discharge status: Home / Self Care | Payer: Medicaid Other | Source: Ambulatory Visit | Attending: Internal Medicine | Admitting: Internal Medicine

## 2020-05-09 ENCOUNTER — Telehealth (HOSPITAL_COMMUNITY): Payer: Self-pay | Admitting: *Deleted

## 2020-05-09 ENCOUNTER — Encounter (HOSPITAL_COMMUNITY): Payer: Self-pay | Admitting: Family Medicine

## 2020-05-09 DIAGNOSIS — Z21 Asymptomatic human immunodeficiency virus [HIV] infection status: Secondary | ICD-10-CM | POA: Insufficient documentation

## 2020-05-09 DIAGNOSIS — F1120 Opioid dependence, uncomplicated: Secondary | ICD-10-CM | POA: Diagnosis not present

## 2020-05-09 DIAGNOSIS — D5700 Hb-SS disease with crisis, unspecified: Secondary | ICD-10-CM | POA: Diagnosis not present

## 2020-05-09 DIAGNOSIS — G894 Chronic pain syndrome: Secondary | ICD-10-CM | POA: Diagnosis not present

## 2020-05-09 DIAGNOSIS — D638 Anemia in other chronic diseases classified elsewhere: Secondary | ICD-10-CM | POA: Insufficient documentation

## 2020-05-09 LAB — COMPREHENSIVE METABOLIC PANEL
ALT: 41 U/L (ref 0–44)
AST: 82 U/L — ABNORMAL HIGH (ref 15–41)
Albumin: 4.2 g/dL (ref 3.5–5.0)
Alkaline Phosphatase: 66 U/L (ref 38–126)
Anion gap: 10 (ref 5–15)
BUN: 9 mg/dL (ref 6–20)
CO2: 24 mmol/L (ref 22–32)
Calcium: 8.9 mg/dL (ref 8.9–10.3)
Chloride: 106 mmol/L (ref 98–111)
Creatinine, Ser: 0.72 mg/dL (ref 0.61–1.24)
GFR, Estimated: >60 mL/min (ref >60)
Glucose, Bld: 78 mg/dL (ref 70–99)
Potassium: 4.5 mmol/L (ref 3.5–5.1)
Sodium: 140 mmol/L (ref 135–145)
Total Bilirubin: 8.2 mg/dL — ABNORMAL HIGH (ref 0.3–1.2)
Total Protein: 9.2 g/dL — ABNORMAL HIGH (ref 6.5–8.1)

## 2020-05-09 LAB — RETICULOCYTES: RBC.: 0.64 MIL/uL — ABNORMAL LOW (ref 4.22–5.81)

## 2020-05-09 LAB — CBC WITH DIFFERENTIAL/PLATELET
Abs Immature Granulocytes: 0.14 10*3/uL — ABNORMAL HIGH (ref 0.00–0.07)
Basophils Absolute: 0.1 10*3/uL (ref 0.0–0.1)
Basophils Relative: 0 %
Eosinophils Absolute: 0.2 10*3/uL (ref 0.0–0.5)
Eosinophils Relative: 1 %
HCT: 22.4 % — ABNORMAL LOW (ref 39.0–52.0)
Hemoglobin: 8.2 g/dL — ABNORMAL LOW (ref 13.0–17.0)
Immature Granulocytes: 1 %
Lymphocytes Relative: 55 %
Lymphs Abs: 7.7 10*3/uL — ABNORMAL HIGH (ref 0.7–4.0)
MCH: 36.9 pg — ABNORMAL HIGH (ref 26.0–34.0)
MCHC: 36.6 g/dL — ABNORMAL HIGH (ref 30.0–36.0)
MCV: 100.9 fL — ABNORMAL HIGH (ref 80.0–100.0)
Monocytes Absolute: 1.3 10*3/uL — ABNORMAL HIGH (ref 0.1–1.0)
Monocytes Relative: 9 %
Neutro Abs: 4.9 10*3/uL (ref 1.7–7.7)
Neutrophils Relative %: 34 %
Platelets: 313 10*3/uL (ref 150–400)
RBC: 2.22 MIL/uL — ABNORMAL LOW (ref 4.22–5.81)
RDW: 27.9 % — ABNORMAL HIGH (ref 11.5–15.5)
WBC: 15.2 10*3/uL — ABNORMAL HIGH (ref 4.0–10.5)
nRBC: 31.6 % — ABNORMAL HIGH (ref 0.0–0.2)

## 2020-05-09 MED ORDER — HYDROMORPHONE 1 MG/ML IV SOLN
INTRAVENOUS | Status: DC
  Administered 2020-05-09: 30 mg via INTRAVENOUS
  Administered 2020-05-09: 11.5 mg via INTRAVENOUS
  Filled 2020-05-09: qty 30

## 2020-05-09 MED ORDER — ONDANSETRON HCL 4 MG/2ML IJ SOLN: 4.0000 mg | Freq: Four times a day (QID) | INTRAMUSCULAR | Status: DC | PRN

## 2020-05-09 MED ORDER — SODIUM CHLORIDE 0.9% FLUSH
9.0000 mL | INTRAVENOUS | Status: DC | PRN
  Administered 2020-05-09: 9 mL via INTRAVENOUS

## 2020-05-09 MED ORDER — DIPHENHYDRAMINE HCL 25 MG PO CAPS: 25.0000 mg | ORAL_CAPSULE | ORAL | Status: DC | PRN

## 2020-05-09 MED ORDER — ACETAMINOPHEN 500 MG PO TABS
1000.0000 mg | ORAL_TABLET | Freq: Once | ORAL | Status: AC
  Administered 2020-05-09: 1000 mg via ORAL
  Filled 2020-05-09: qty 2

## 2020-05-09 MED ORDER — SODIUM CHLORIDE 0.9% FLUSH: 10.0000 mL | INTRAVENOUS | Status: DC | PRN

## 2020-05-09 MED ORDER — NALOXONE HCL 0.4 MG/ML IJ SOLN: 0.4000 mg | INTRAMUSCULAR | Status: DC | PRN

## 2020-05-09 MED ORDER — SODIUM CHLORIDE 0.45 % IV SOLN: INTRAVENOUS | Status: DC

## 2020-05-09 MED ORDER — HEPARIN SOD (PORK) LOCK FLUSH 100 UNIT/ML IV SOLN
500.0000 [IU] | INTRAVENOUS | Status: DC | PRN
  Administered 2020-05-09: 500 [IU]
  Filled 2020-05-09: qty 5

## 2020-05-09 NOTE — Progress Notes (Signed)
Patient admitted to the day hospital for treatment of sickle cell pain crisis. Patient reported generalized pain rated 8/10. Patient placed on Dilaudid PCA, given PO Tylenol and hydrated with IV fluids. At discharge patient reported  pain at 4/10. Discharge instructions given to patient. Patient alert, oriented and ambulatory at discharge.

## 2020-05-09 NOTE — Progress Notes (Signed)
[facility] History and Physical   Date: 05/09/2020  Patient name: Martin [name] Medical record number: [id number] Date of birth: 1990-09-16 Age: 30 y.o. Gender: male PCP: [name], [name]  Attending physician: No att. providers found  Chief Complaint: Sickle cell pain  History of Present Illness: Martin [name] is a 30 year old male with a medical history significant for sickle cell disease, chronic pain syndrome, opiate dependence and tolerance, history of HIV disease, history of generalized anxiety, and history of anemia of chronic disease presents with complaints of generalized pain that is consistent with typical sickle cell pain crisis.  He attributes pain crisis to changes in weather and increased stressors at home.  Patient currently rates pain at 9/10 characterized as constant and aching.  He last had Percocet this a.m. with minimal relief.  He currently denies any subjective fever, chills, headache, shortness of breath, or persistent cough.  No urinary symptoms, nausea, vomiting, or diarrhea.  No sick contacts, recent travel, or exposure to COVID-19.  Meds: No medications prior to admission.    Allergies: Tape and Ketorolac Past Medical History:  Diagnosis Date  . Anxiety   . HIV (human immunodeficiency virus infection) (HCC)   . Proteinuria   . Sickle cell crisis (HCC)   . 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HCC)   . Vitamin D deficiency 10/2018   Past Surgical History:  Procedure Laterality Date  . IR IMAGING GUIDED PORT INSERTION  08/29/2019   Family History  Problem Relation Age of Onset  . Sickle cell trait Mother   . Sickle cell trait Father   . Birth defects Maternal Grandmother   . Birth defects Paternal Grandmother    Social History   Socioeconomic History  . Marital status: Single    Spouse name: Not on file  . Number of children: Not on file  . Years of education: Not on file  . Highest education level: Not on file  Occupational  History  . Not on file  Tobacco Use  . Smoking status: Never Smoker  . Smokeless tobacco: Never Used  Vaping Use  . Vaping Use: Some days  . Substances: THC  Substance and Sexual Activity  . Alcohol use: No  . Drug use: Yes    Types: Marijuana  . Sexual activity: Yes    Birth control/protection: Condom  Other Topics Concern  . Not on file  Social History Narrative  . Not on file   Social Determinants of Health   Financial Resource Strain: Not on file  Food Insecurity: Not on file  Transportation Needs: Not on file  Physical Activity: Not on file  Stress: Not on file  Social Connections: Not on file  Intimate Partner Violence: Not on file   Review of Systems  Constitutional: Negative.   HENT: Negative.   Eyes: Negative.   Respiratory: Negative.   Cardiovascular: Negative.   Gastrointestinal: Negative.   Genitourinary: Negative.   Musculoskeletal: Positive for back pain and joint pain.  Neurological: Negative.   Psychiatric/Behavioral: Negative.     Physical Exam: Blood pressure 112/69, pulse 72, temperature 98.1 F (36.7 C), temperature source Temporal, resp. rate 10, SpO2 91 %. Physical Exam Constitutional:      Appearance: Normal appearance.  HENT:     Nose: Nose normal.  Eyes:     Pupils: Pupils are equal, round, and reactive to light.  Cardiovascular:     Rate and Rhythm: Normal rate and regular rhythm.     Pulses: Normal pulses.  Pulmonary:     Effort: Pulmonary effort is normal.  Abdominal:     General: Abdomen is flat. Bowel sounds are normal.  Musculoskeletal:        General: Normal range of motion.  Skin:    General: Skin is warm.  Neurological:     General: No focal deficit present.     Mental Status: He is alert. Mental status is at baseline.  Psychiatric:        Mood and Affect: Mood normal.        Thought Content: Thought content normal.        Judgment: Judgment normal.      Lab results: Results for orders placed or performed  during the hospital encounter of 05/09/20 (from the past 24 hour(s))  Comprehensive metabolic panel     Status: Abnormal   Collection Time: 05/09/20 11:30 AM  Result Value Ref Range   Sodium 140 135 - 145 mmol/L   Potassium 4.5 3.5 - 5.1 mmol/L   Chloride 106 98 - 111 mmol/L   CO2 24 22 - 32 mmol/L   Glucose, Bld 78 70 - 99 mg/dL   BUN 9 6 - 20 mg/dL   Creatinine, Ser 5.88 0.61 - 1.24 mg/dL   Calcium 8.9 8.9 - 50.2 mg/dL   Total Protein 9.2 (H) 6.5 - 8.1 g/dL   Albumin 4.2 3.5 - 5.0 g/dL   AST 82 (H) 15 - 41 U/L   ALT 41 0 - 44 U/L   Alkaline Phosphatase 66 38 - 126 U/L   Total Bilirubin 8.2 (H) 0.3 - 1.2 mg/dL   GFR, Estimated >77 >41 mL/min   Anion gap 10 5 - 15  CBC WITH DIFFERENTIAL     Status: Abnormal   Collection Time: 05/09/20 11:30 AM  Result Value Ref Range   WBC 15.2 (H) 4.0 - 10.5 K/uL   RBC 2.22 (L) 4.22 - 5.81 MIL/uL   Hemoglobin 8.2 (L) 13.0 - 17.0 g/dL   HCT 28.7 (L) 86.7 - 67.2 %   MCV 100.9 (H) 80.0 - 100.0 fL   MCH 36.9 (H) 26.0 - 34.0 pg   MCHC 36.6 (H) 30.0 - 36.0 g/dL   RDW 09.4 (H) 70.9 - 62.8 %   Platelets 313 150 - 400 K/uL   nRBC 31.6 (H) 0.0 - 0.2 %   Neutrophils Relative % 34 %   Neutro Abs 4.9 1.7 - 7.7 K/uL   Lymphocytes Relative 55 %   Lymphs Abs 7.7 (H) 0.7 - 4.0 K/uL   Monocytes Relative 9 %   Monocytes Absolute 1.3 (H) 0.1 - 1.0 K/uL   Eosinophils Relative 1 %   Eosinophils Absolute 0.2 0.0 - 0.5 K/uL   Basophils Relative 0 %   Basophils Absolute 0.1 0.0 - 0.1 K/uL   Immature Granulocytes 1 %   Abs Immature Granulocytes 0.14 (H) 0.00 - 0.07 K/uL   Agglutination PRESENT    Polychromasia MARKED    Sickle Cells MARKED    Target Cells PRESENT   Reticulocytes     Status: Abnormal   Collection Time: 05/09/20 11:30 AM  Result Value Ref Range   Retic Ct Pct RESULTS UNAVAILABLE DUE TO INTERFERING SUBSTANCE 0.4 - 3.1 %   RBC. 0.64 (L) 4.22 - 5.81 MIL/uL   Retic Count, Absolute NCAL 19.0 - 186.0 K/uL   Immature Retic Fract Not Measured 2.3  - 15.9 %    Imaging results:  No results found.   Assessment & Plan: Patient admitted  to sickle cell day infusion center for management of pain crisis.  Patient is opiate tolerant Initiate IV dilaudid PCA. Settings of 0.5 mg, 10 minute lockout, and 3 mg/hr IV fluids, 0.45% saline at 100 ml/hr Toradol 15 mg IV times one dose Tylenol 1000 mg by mouth times one dose Review CBC with differential, complete metabolic panel, and reticulocytes as results become available.  Pain intensity will be reevaluated in context of functioning and relationship to baseline as care progresses If pain intensity remains elevated and/or sudden change in hemodynamic stability transition to inpatient services for higher level of care.    [name]  APRN, [name], [name] [facility] [address] [place], [place] [address] [phone]    05/09/2020, 6:28 PM

## 2020-05-09 NOTE — Telephone Encounter (Signed)
Patient called requesting to come to the day hospital for sickle cell pain. Patient reports generalized pain rated 10/10. Reports taking Percocet at 6:00 am. COVID-19 screening done and patient denies all symptoms and exposures. Denies fever, chest pain, nausea, vomiting, diarrhea, abdominal pain and priapism. Admits to having transportation at discharge without driving self.  [place], [name] notified. Patient can come to the day hospital for pain management. Patient advised and expresses an understanding.

## 2020-05-09 NOTE — Discharge Instructions (Signed)
Sickle Cell Anemia, Adult  Sickle cell anemia is a condition where your red blood cells are shaped like sickles. Red blood cells carry oxygen through the body. Sickle-shaped cells do not live as long as normal red blood cells. They also clump together and block blood from flowing through the blood vessels. This prevents the body from getting enough oxygen. Sickle cell anemia causes organ damage and pain. It also increases the risk of infection. Follow these instructions at home: Medicines  Take over-the-counter and prescription medicines only as told by your doctor.  If you were prescribed an antibiotic medicine, take it as told by your doctor. Do not stop taking the antibiotic even if you start to feel better.  If you develop a fever, do not take medicines to lower the fever right away. Tell your doctor about the fever. Managing pain, stiffness, and swelling  Try these methods to help with pain: ? Use a heating pad. ? Take a warm bath. ? Distract yourself, such as by watching TV. Eating and drinking  Drink enough fluid to keep your pee (urine) clear or pale yellow. Drink more in hot weather and during exercise.  Limit or avoid alcohol.  Eat a healthy diet. Eat plenty of fruits, vegetables, whole grains, and lean protein.  Take vitamins and supplements as told by your doctor. Traveling  When traveling, keep these with you: ? Your medical information. ? The names of your doctors. ? Your medicines.  If you need to take an airplane, talk to your doctor first. Activity  Rest often.  Avoid exercises that make your heart beat much faster, such as jogging. General instructions  Do not use products that have nicotine or tobacco, such as cigarettes and e-cigarettes. If you need help quitting, ask your doctor.  Consider wearing a medical alert bracelet.  Avoid being in high places (high altitudes), such as mountains.  Avoid very hot or cold temperatures.  Avoid places where the  temperature changes a lot.  Keep all follow-up visits as told by your doctor. This is important. Contact a doctor if:  A joint hurts.  Your feet or hands hurt or swell.  You feel tired (fatigued). Get help right away if:  You have symptoms of infection. These include: ? Fever. ? Chills. ? Being very tired. ? Irritability. ? Poor eating. ? Throwing up (vomiting).  You feel dizzy or faint.  You have new stomach pain, especially on the left side.  You have a an erection (priapism) that lasts more than 4 hours.  You have numbness in your arms or legs.  You have a hard time moving your arms or legs.  You have trouble talking.  You have pain that does not go away when you take medicine.  You are short of breath.  You are breathing fast.  You have a long-term cough.  You have pain in your chest.  You have a bad headache.  You have a stiff neck.  Your stomach looks bloated even though you did not eat much.  Your skin is pale.  You suddenly cannot see well. Summary  Sickle cell anemia is a condition where your red blood cells are shaped like sickles.  Follow your doctor's advice on ways to manage pain, food to eat, activities to do, and steps to take for safe travel.  Get medical help right away if you have any signs of infection, such as a fever. This information is not intended to replace advice given to you by   your health care provider. Make sure you discuss any questions you have with your health care provider. Document Revised: 08/31/2019 Document Reviewed: 08/31/2019 Elsevier Patient Education  2021 [employer] Inc.  

## 2020-05-09 NOTE — Discharge Summary (Signed)
[facility] Discharge Summary   Patient ID: Martin [name] MRN: [id number] DOB/AGE: 31-Sep-1992 30 y.o.  Admit date: 05/09/2020 Discharge date: 05/09/2020  Primary Care Physician:  [name], [name]  Admission Diagnoses:  Active Problems:   Sickle cell crisis [facility])  Discharge Medications:  Allergies as of 05/09/2020      Reactions   Tape Rash, Other (See Comments)   PLEASE DO NOT USE THE CLEAR, THICK, "PLASTIC" TAPE- only paper tape is tolerated    Ketorolac Swelling, Other (See Comments)   Patient reports facial edema and left arm edema after administration.      Medication List    TAKE these medications   apixaban 5 MG Tabs tablet Commonly known as: Eliquis Take 1 tablet (5 mg total) by mouth 2 (two) times daily.   hydroxyurea 500 MG capsule Commonly known as: HYDREA Take 4 capsules (2,000 mg total) by mouth at bedtime.   multivitamin with minerals Tabs tablet Take 1 tablet by mouth daily with breakfast.   naloxone 0.4 MG/ML injection Commonly known as: NARCAN As needed. What changed:   how much to take  how to take this  when to take this  reasons to take this  additional instructions   oxyCODONE-acetaminophen 10-325 MG tablet Commonly known as: PERCOCET Take 1 tablet by mouth every 4 (four) hours as needed for up to 15 days for pain.   traZODone 100 MG tablet Commonly known as: DESYREL Take 1 tablet (100 mg total) by mouth at bedtime as needed for sleep.   Triumeq 600-50-300 MG tablet Generic drug: abacavir-dolutegravir-lamiVUDine Take 1 tablet by mouth at bedtime.   Vitamin D (Ergocalciferol) 1.25 MG (50000 UNIT) Caps capsule Commonly known as: DRISDOL Take 1 capsule (50,000 Units total) by mouth every 7 (seven) days.        Consults:  None  Significant Diagnostic Studies:  DG Chest Port 1 View  Result Date: 04/29/2020 CLINICAL DATA:  Chest pain EXAM: PORTABLE CHEST 1 VIEW COMPARISON:  Radiograph 04/03/2020, CT  04/01/2020 FINDINGS: Accessed right IJ approach Port-A-Cath tip terminates at the superior cavoatrial junction. Telemetry leads overlie the chest. There is some chronically coarsened interstitial and bronchitic features within the lungs. No clear superimposed acute abnormality is evident. No pneumothorax or effusion. Stable cardiomediastinal contours. No acute osseous or soft tissue abnormality. IMPRESSION: 1. Chronic interstitial and bronchitic features within the lungs. 2. No clear superimposed acute cardiopulmonary process is evident. Electronically Signed   By: Kreg Shropshire M.D.   On: 04/29/2020 05:43   History of present illness: Martin [name] is a 30 year old male with a medical history significant for sickle cell disease, chronic pain syndrome, opiate dependence and tolerance, history of HIV disease, history of generalized anxiety, and history of anemia of chronic disease presents with complaints of generalized pain that is consistent with typical sickle cell pain crisis.  He attributes pain crisis to changes in weather and increased stressors at home.  Patient currently rates pain at 9/10 characterized as constant and aching.  He last had Percocet this a.m. with minimal relief.  He currently denies any subjective fever, chills, headache, shortness of breath, or persistent cough.  No urinary symptoms, nausea, vomiting, or diarrhea.  No sick contacts, recent travel, or exposure to COVID-19.  [facility] Course: Patient admitted to sickle cell day infusion clinic for management of pain crisis. Reviewed all laboratory values, largely consistent with patient's baseline. Pain managed with IV Dilaudid via PCA Tylenol 1000 mg  x 1 Held Toradol due to listed allergy IV fluids, 0.45% saline at 1 2 mL/h Pain intensity decreased to 4/10.  Patient does not warrant admission at this time.  He is requesting discharge home.  He is alert, oriented, and ambulating without assistance.  Patient  will discharge home in a hemodynamically stable condition.  Discharge instructions: Resume all home medications.   Follow up with PCP as previously  scheduled.   Discussed the importance of drinking 64 ounces of water daily, dehydration of red blood cells may lead further sickling.   Avoid all stressors that precipitate sickle cell pain crisis.     The patient was given clear instructions to go to ER or return to medical center if symptoms do not improve, worsen or new problems develop.       Physical Exam at Discharge:  BP 112/69 (BP Location: Right Arm)   Pulse 72   Temp 98.1 F (36.7 C) (Temporal)   Resp 10   SpO2 91%   Physical Exam Constitutional:      Appearance: Normal appearance.  Eyes:     General: Scleral icterus present.     Pupils: Pupils are equal, round, and reactive to light.  Cardiovascular:     Rate and Rhythm: Normal rate and regular rhythm.     Pulses: Normal pulses.  Pulmonary:     Effort: Pulmonary effort is normal.  Abdominal:     General: Abdomen is flat. Bowel sounds are normal.  Musculoskeletal:        General: Normal range of motion.  Skin:    General: Skin is warm.  Neurological:     General: No focal deficit present.     Mental Status: He is alert. Mental status is at baseline.  Psychiatric:        Mood and Affect: Mood normal.        Behavior: Behavior normal.        Thought Content: Thought content normal.        Judgment: Judgment normal.     Disposition at Discharge: Discharge disposition: 01-Home or Self Care       Discharge Orders: Discharge Instructions    Discharge patient   Complete by: As directed    Discharge disposition: 01-Home or Self Care   Discharge patient date: 05/09/2020      Condition at Discharge:   Stable  Time spent on Discharge:  Greater than 30 minutes.  Signed: [name]  APRN, [name], [name] Patient Care [facility] [address] [place], [place]  [address] [phone]  05/09/2020, 6:22 PM

## 2020-05-12 NOTE — H&P (Addendum)
[facility] History and Physical   Date: 05/12/2020  Patient name: Martin [name] Medical record number: [id number] Date of birth: February 16, 1991 Age: 30 y.o. Gender: male PCP: [name], [name]  Attending physician: No att. providers found  Chief Complaint: Sickle cell pain  History of Present Illness: Martin [name] is a 30 year old male with a medical history significant for sickle cell disease, chronic pain syndrome, opiate dependence and tolerance, history of HIV disease, history of generalized anxiety, and history of anemia of chronic disease presents with complaints of generalized pain that is consistent with typical sickle cell pain crisis.  He attributes pain crisis to changes in weather and increased stressors at home.  Patient currently rates pain at 9/10 characterized as constant and aching.  He last had Percocet this a.m. with minimal relief.  He currently denies any subjective fever, chills, headache, shortness of breath, or persistent cough.  No urinary symptoms, nausea, vomiting, or diarrhea.  No sick contacts, recent travel, or exposure to COVID-19.  Meds: No medications prior to admission.    Allergies: Tape and Ketorolac Past Medical History:  Diagnosis Date  . Anxiety   . HIV (human immunodeficiency virus infection) (HCC)   . Proteinuria   . Sickle cell crisis (HCC)   . 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HCC)   . Vitamin D deficiency 10/2018   Past Surgical History:  Procedure Laterality Date  . IR IMAGING GUIDED PORT INSERTION  08/29/2019   Family History  Problem Relation Age of Onset  . Sickle cell trait Mother   . Sickle cell trait Father   . Birth defects Maternal Grandmother   . Birth defects Paternal Grandmother    Social History   Socioeconomic History  . Marital status: Single    Spouse name: Not on file  . Number of children: Not on file  . Years of education: Not on file  . Highest education level: Not on file  Occupational  History  . Not on file  Tobacco Use  . Smoking status: Never Smoker  . Smokeless tobacco: Never Used  Vaping Use  . Vaping Use: Some days  . Substances: THC  Substance and Sexual Activity  . Alcohol use: No  . Drug use: Yes    Types: Marijuana  . Sexual activity: Yes    Birth control/protection: Condom  Other Topics Concern  . Not on file  Social History Narrative  . Not on file   Social Determinants of Health   Financial Resource Strain: Not on file  Food Insecurity: Not on file  Transportation Needs: Not on file  Physical Activity: Not on file  Stress: Not on file  Social Connections: Not on file  Intimate Partner Violence: Not on file   Review of Systems  Constitutional: Negative for chills and fever.  HENT: Negative.   Eyes: Negative.   Respiratory: Negative.   Cardiovascular: Negative.   Gastrointestinal: Negative.   Genitourinary: Negative.   Musculoskeletal: Positive for back pain and joint pain.  Skin: Negative.   Neurological: Negative.   Psychiatric/Behavioral: Negative.      Physical Exam: Blood pressure 112/69, pulse 72, temperature 98.1 F (36.7 C), temperature source Temporal, resp. rate 10, SpO2 91 %. Physical Exam Constitutional:      Appearance: Normal appearance.  Eyes:     General: Scleral icterus present.     Pupils: Pupils are equal, round, and reactive to light.  Cardiovascular:     Rate and Rhythm: Normal rate and regular rhythm.  Pulses: Normal pulses.  Pulmonary:     Effort: Pulmonary effort is normal.     Breath sounds: Normal breath sounds.  Abdominal:     General: Abdomen is flat. Bowel sounds are normal.  Musculoskeletal:        General: Normal range of motion.  Skin:    General: Skin is warm.  Neurological:     General: No focal deficit present.     Mental Status: He is alert. Mental status is at baseline.  Psychiatric:        Mood and Affect: Mood normal.        Behavior: Behavior normal.        Thought Content:  Thought content normal.        Judgment: Judgment normal.      Lab results: No results found for this or any previous visit (from the past 24 hour(s)).  Imaging results:  No results found.   Assessment & Plan: Patient admitted to sickle cell day infusion center for management of pain crisis.  Patient is opiate tolerant Initiate IV dilaudid PCA IV fluids, 0.45% saline at 100 ml/hr Toradol 15 mg IV times one dose Tylenol 1000 mg by mouth times one dose Review CBC with differential, complete metabolic panel, and reticulocytes as results become available.  Pain intensity will be reevaluated in context of functioning and relationship to baseline as care progresses If pain intensity remains elevated and/or sudden change in hemodynamic stability transition to inpatient services for higher level of care.       [name]  APRN, [name], [name] Patient Care [facility] [address] [place], [place] [address] [phone]  05/12/2020, 10:25 AM

## 2020-05-13 ENCOUNTER — Other Ambulatory Visit: Payer: Self-pay

## 2020-05-13 ENCOUNTER — Inpatient Hospital Stay (HOSPITAL_COMMUNITY)
Admission: EM | Admit: 2020-05-13 | Discharge: 2020-05-16 | DRG: 811 | Discharge status: Against Medical Advice | Payer: Medicaid Other | Attending: Internal Medicine | Admitting: Internal Medicine

## 2020-05-13 ENCOUNTER — Emergency Department (HOSPITAL_COMMUNITY): Payer: Medicaid Other

## 2020-05-13 ENCOUNTER — Encounter (HOSPITAL_COMMUNITY): Payer: Self-pay | Admitting: Emergency Medicine

## 2020-05-13 DIAGNOSIS — Z91048 Other nonmedicinal substance allergy status: Secondary | ICD-10-CM | POA: Diagnosis not present

## 2020-05-13 DIAGNOSIS — Z832 Family history of diseases of the blood and blood-forming organs and certain disorders involving the immune mechanism: Secondary | ICD-10-CM

## 2020-05-13 DIAGNOSIS — F1120 Opioid dependence, uncomplicated: Secondary | ICD-10-CM | POA: Diagnosis present

## 2020-05-13 DIAGNOSIS — G894 Chronic pain syndrome: Secondary | ICD-10-CM | POA: Diagnosis present

## 2020-05-13 DIAGNOSIS — U071 COVID-19: Secondary | ICD-10-CM

## 2020-05-13 DIAGNOSIS — Z86711 Personal history of pulmonary embolism: Secondary | ICD-10-CM | POA: Diagnosis present

## 2020-05-13 DIAGNOSIS — Z21 Asymptomatic human immunodeficiency virus [HIV] infection status: Secondary | ICD-10-CM | POA: Diagnosis present

## 2020-05-13 DIAGNOSIS — D5700 Hb-SS disease with crisis, unspecified: Secondary | ICD-10-CM | POA: Diagnosis present

## 2020-05-13 DIAGNOSIS — R0902 Hypoxemia: Secondary | ICD-10-CM | POA: Diagnosis present

## 2020-05-13 DIAGNOSIS — D5701 Hb-SS disease with acute chest syndrome: Principal | ICD-10-CM | POA: Diagnosis present

## 2020-05-13 DIAGNOSIS — B20 Human immunodeficiency virus [HIV] disease: Secondary | ICD-10-CM | POA: Diagnosis present

## 2020-05-13 DIAGNOSIS — K219 Gastro-esophageal reflux disease without esophagitis: Secondary | ICD-10-CM | POA: Diagnosis present

## 2020-05-13 DIAGNOSIS — K5900 Constipation, unspecified: Secondary | ICD-10-CM | POA: Diagnosis present

## 2020-05-13 DIAGNOSIS — E559 Vitamin D deficiency, unspecified: Secondary | ICD-10-CM | POA: Diagnosis present

## 2020-05-13 DIAGNOSIS — F419 Anxiety disorder, unspecified: Secondary | ICD-10-CM | POA: Diagnosis present

## 2020-05-13 DIAGNOSIS — F411 Generalized anxiety disorder: Secondary | ICD-10-CM | POA: Diagnosis present

## 2020-05-13 DIAGNOSIS — Z7902 Long term (current) use of antithrombotics/antiplatelets: Secondary | ICD-10-CM | POA: Diagnosis not present

## 2020-05-13 DIAGNOSIS — Z888 Allergy status to other drugs, medicaments and biological substances status: Secondary | ICD-10-CM

## 2020-05-13 DIAGNOSIS — Z79899 Other long term (current) drug therapy: Secondary | ICD-10-CM | POA: Diagnosis not present

## 2020-05-13 DIAGNOSIS — D638 Anemia in other chronic diseases classified elsewhere: Secondary | ICD-10-CM | POA: Diagnosis present

## 2020-05-13 LAB — RETICULOCYTES
Immature Retic Fract: 31.9 % — ABNORMAL HIGH (ref 2.3–15.9)
RBC.: 2.3 MIL/uL — ABNORMAL LOW (ref 4.22–5.81)
Retic Count, Absolute: 626.5 10*3/uL — ABNORMAL HIGH (ref 19.0–186.0)
Retic Ct Pct: 27.2 % — ABNORMAL HIGH (ref 0.4–3.1)

## 2020-05-13 LAB — COMPREHENSIVE METABOLIC PANEL
ALT: 27 U/L (ref 0–44)
AST: 54 U/L — ABNORMAL HIGH (ref 15–41)
Albumin: 3.7 g/dL (ref 3.5–5.0)
Alkaline Phosphatase: 57 U/L (ref 38–126)
Anion gap: 9 (ref 5–15)
BUN: 15 mg/dL (ref 6–20)
CO2: 22 mmol/L (ref 22–32)
Calcium: 8.8 mg/dL — ABNORMAL LOW (ref 8.9–10.3)
Chloride: 106 mmol/L (ref 98–111)
Creatinine, Ser: 0.76 mg/dL (ref 0.61–1.24)
GFR, Estimated: >60 mL/min (ref >60)
Glucose, Bld: 88 mg/dL (ref 70–99)
Potassium: 4.4 mmol/L (ref 3.5–5.1)
Sodium: 137 mmol/L (ref 135–145)
Total Bilirubin: 9.1 mg/dL — ABNORMAL HIGH (ref 0.3–1.2)
Total Protein: 8.7 g/dL — ABNORMAL HIGH (ref 6.5–8.1)

## 2020-05-13 LAB — CBC WITH DIFFERENTIAL/PLATELET
Abs Immature Granulocytes: 0.07 10*3/uL (ref 0.00–0.07)
Basophils Absolute: 0.1 10*3/uL (ref 0.0–0.1)
Basophils Relative: 0 %
Eosinophils Absolute: 0.2 10*3/uL (ref 0.0–0.5)
Eosinophils Relative: 1 %
HCT: 21.5 % — ABNORMAL LOW (ref 39.0–52.0)
Hemoglobin: 8.1 g/dL — ABNORMAL LOW (ref 13.0–17.0)
Immature Granulocytes: 0 %
Lymphocytes Relative: 55 %
Lymphs Abs: 8.6 10*3/uL — ABNORMAL HIGH (ref 0.7–4.0)
MCH: 36.5 pg — ABNORMAL HIGH (ref 26.0–34.0)
MCHC: 37.7 g/dL — ABNORMAL HIGH (ref 30.0–36.0)
MCV: 96.8 fL (ref 80.0–100.0)
Monocytes Absolute: 1.3 10*3/uL — ABNORMAL HIGH (ref 0.1–1.0)
Monocytes Relative: 8 %
Neutro Abs: 5.7 10*3/uL (ref 1.7–7.7)
Neutrophils Relative %: 36 %
Platelets: 336 10*3/uL (ref 150–400)
RBC: 2.22 MIL/uL — ABNORMAL LOW (ref 4.22–5.81)
RDW: 24.8 % — ABNORMAL HIGH (ref 11.5–15.5)
WBC: 15.3 10*3/uL — ABNORMAL HIGH (ref 4.0–10.5)
nRBC: 0.6 % — ABNORMAL HIGH (ref 0.0–0.2)

## 2020-05-13 LAB — SARS CORONAVIRUS 2 (TAT 6-24 HRS): SARS Coronavirus 2: POSITIVE — AB

## 2020-05-13 MED ORDER — SODIUM CHLORIDE 0.9 % IV BOLUS
1000.0000 mL | Freq: Once | INTRAVENOUS | Status: AC
  Administered 2020-05-13: 1000 mL via INTRAVENOUS

## 2020-05-13 MED ORDER — SODIUM CHLORIDE 0.9 % IV SOLN
1.0000 g | Freq: Once | INTRAVENOUS | Status: AC
  Administered 2020-05-13: 1 g via INTRAVENOUS
  Filled 2020-05-13: qty 10

## 2020-05-13 MED ORDER — DIPHENHYDRAMINE HCL 25 MG PO CAPS
25.0000 mg | ORAL_CAPSULE | Freq: Once | ORAL | Status: AC
  Administered 2020-05-13: 25 mg via ORAL
  Filled 2020-05-13: qty 1

## 2020-05-13 MED ORDER — APIXABAN 5 MG PO TABS
5.0000 mg | ORAL_TABLET | Freq: Two times a day (BID) | ORAL | Status: DC
  Administered 2020-05-13 – 2020-05-16 (×7): 5 mg via ORAL
  Filled 2020-05-13 (×8): qty 1

## 2020-05-13 MED ORDER — SODIUM CHLORIDE 0.9% FLUSH: 9.0000 mL | INTRAVENOUS | Status: DC | PRN

## 2020-05-13 MED ORDER — HYDROMORPHONE HCL 2 MG/ML IJ SOLN
2.0000 mg | Freq: Once | INTRAMUSCULAR | Status: AC
  Administered 2020-05-13: 2 mg via INTRAVENOUS
  Filled 2020-05-13: qty 1

## 2020-05-13 MED ORDER — ADULT MULTIVITAMIN W/MINERALS CH
1.0000 | ORAL_TABLET | Freq: Every day | ORAL | Status: DC
  Administered 2020-05-14 – 2020-05-16 (×3): 1 via ORAL
  Filled 2020-05-13 (×3): qty 1

## 2020-05-13 MED ORDER — SODIUM CHLORIDE 0.9 % IV SOLN
25.0000 mg | INTRAVENOUS | Status: DC | PRN
  Filled 2020-05-13: qty 0.5

## 2020-05-13 MED ORDER — TRAZODONE HCL 100 MG PO TABS: 100.0000 mg | ORAL_TABLET | Freq: Every evening | ORAL | Status: DC | PRN

## 2020-05-13 MED ORDER — SODIUM CHLORIDE 0.9 % IV SOLN
100.0000 mg | Freq: Every day | INTRAVENOUS | Status: DC
  Administered 2020-05-14 – 2020-05-16 (×3): 100 mg via INTRAVENOUS
  Filled 2020-05-13 (×3): qty 20

## 2020-05-13 MED ORDER — ONDANSETRON HCL 4 MG/2ML IJ SOLN
4.0000 mg | Freq: Four times a day (QID) | INTRAMUSCULAR | Status: DC | PRN
  Administered 2020-05-14 – 2020-05-15 (×3): 4 mg via INTRAVENOUS
  Filled 2020-05-13 (×3): qty 2

## 2020-05-13 MED ORDER — SENNOSIDES-DOCUSATE SODIUM 8.6-50 MG PO TABS
1.0000 | ORAL_TABLET | Freq: Two times a day (BID) | ORAL | Status: DC
  Administered 2020-05-13 – 2020-05-16 (×7): 1 via ORAL
  Filled 2020-05-13 (×7): qty 1

## 2020-05-13 MED ORDER — SODIUM CHLORIDE 0.9 % IV SOLN
1.0000 g | INTRAVENOUS | Status: DC
  Administered 2020-05-14 – 2020-05-16 (×3): 1 g via INTRAVENOUS
  Filled 2020-05-13 (×3): qty 10

## 2020-05-13 MED ORDER — POLYETHYLENE GLYCOL 3350 17 G PO PACK: 17.0000 g | PACK | Freq: Every day | ORAL | Status: DC | PRN

## 2020-05-13 MED ORDER — DIPHENHYDRAMINE HCL 25 MG PO CAPS
25.0000 mg | ORAL_CAPSULE | ORAL | Status: DC | PRN
  Administered 2020-05-15: 25 mg via ORAL
  Filled 2020-05-13: qty 1

## 2020-05-13 MED ORDER — NALOXONE HCL 0.4 MG/ML IJ SOLN: 0.4000 mg | INTRAMUSCULAR | Status: DC | PRN

## 2020-05-13 MED ORDER — HYDROMORPHONE HCL 2 MG/ML IJ SOLN
2.0000 mg | INTRAMUSCULAR | Status: DC | PRN
  Administered 2020-05-13 (×3): 2 mg via INTRAVENOUS
  Filled 2020-05-13 (×3): qty 1

## 2020-05-13 MED ORDER — ABACAVIR-DOLUTEGRAVIR-LAMIVUD 600-50-300 MG PO TABS
1.0000 | ORAL_TABLET | Freq: Every day | ORAL | Status: DC
  Administered 2020-05-13 – 2020-05-15 (×3): 1 via ORAL
  Filled 2020-05-13 (×5): qty 1

## 2020-05-13 MED ORDER — APIXABAN 5 MG PO TABS: 5.0000 mg | ORAL_TABLET | Freq: Two times a day (BID) | ORAL | Status: DC

## 2020-05-13 MED ORDER — HYDROXYUREA 500 MG PO CAPS
2000.0000 mg | ORAL_CAPSULE | Freq: Every day | ORAL | Status: DC
  Administered 2020-05-13 – 2020-05-15 (×3): 2000 mg via ORAL
  Filled 2020-05-13 (×4): qty 4

## 2020-05-13 MED ORDER — SODIUM CHLORIDE 0.9 % IV SOLN
200.0000 mg | Freq: Once | INTRAVENOUS | Status: AC
  Administered 2020-05-13: 200 mg via INTRAVENOUS
  Filled 2020-05-13: qty 200

## 2020-05-13 MED ORDER — AZITHROMYCIN 250 MG PO TABS
500.0000 mg | ORAL_TABLET | Freq: Every day | ORAL | Status: AC
  Administered 2020-05-14 – 2020-05-15 (×2): 500 mg via ORAL
  Filled 2020-05-13 (×2): qty 2

## 2020-05-13 MED ORDER — AZITHROMYCIN 250 MG PO TABS
500.0000 mg | ORAL_TABLET | Freq: Once | ORAL | Status: AC
  Administered 2020-05-13: 500 mg via ORAL
  Filled 2020-05-13: qty 2

## 2020-05-13 MED ORDER — HYDROMORPHONE 1 MG/ML IV SOLN
INTRAVENOUS | Status: DC
  Administered 2020-05-13 – 2020-05-14 (×2): 30 mg via INTRAVENOUS
  Administered 2020-05-15 (×2): 3.5 mg via INTRAVENOUS
  Administered 2020-05-15: 10.5 mg via INTRAVENOUS
  Administered 2020-05-15: 30 mg via INTRAVENOUS
  Administered 2020-05-16: 0.5 mg via INTRAVENOUS
  Administered 2020-05-16: 3 mg via INTRAVENOUS
  Filled 2020-05-13 (×3): qty 30

## 2020-05-13 NOTE — ED Triage Notes (Signed)
Patient presents with generalized pain r/t SCC. Patient denies other symptoms. Patient notes to be 80% RA, placed patient on 4L [place], now at 100%

## 2020-05-13 NOTE — ED Provider Notes (Signed)
Pt seen by Dr Judd Lien.  Here with sickle cell pain crisis.  Noted to be hypoxic.  Plan on reassess after pain meds, reassess , has home o2 available.  CXR shows possible infiltrates.  Pt still having pain.  Possible acute chest.  Abx ordered.  Consulted with [place] Hollis sickle cell service for admission.   Linwood Dibbles, MD 05/13/20 [phone]

## 2020-05-13 NOTE — H&P (Addendum)
H&P  Patient Demographics:  Martin [name], is a 30 y.o. male  MRN: [id number]   DOB - [birth year]  Admit Date - 05/13/2020  Outpatient Primary MD for the patient is [name], [name]  Chief Complaint  Patient presents with  . Sickle Cell Pain Crisis      HPI:   Martin [name]  is a 30 y.o. male with a medical history significant for sickle cell disease, chronic pain syndrome, opiate dependence and tolerance, history of HIV disease, history of generalized anxiety, and history of anemia of chronic disease presents with complaints of generalized pain that is consistent with his previous pain crisis.  Patient says that pain is primarily to central chest, low back, and lower extremities.  He says the pain intensity increased suddenly on last night.  He attributes pain crisis to changes in weather.  He has been taking Percocet at home consistently without sustained relief.  His pain intensity is 10/10 characterized as constant and throbbing.  He denies any headache, shortness of breath, urinary symptoms, nausea, vomiting, or diarrhea.  No sick contact, recent travel, or known exposure to COVID-19.  Of note, patient has been vaccinated against COVID-19.  ER course: Vital signs recorded as: BP 109/74   Pulse 64   Temp 98.5 F (36.9 C) (Oral)   Resp (!) 8   Ht 6\' 3"  (1.905 m)   Wt 68 kg   SpO2 95%   BMI 18.75 kg/m Oxygen saturation 80-90s on RA, improved with 2 L supplemental oxygen.  Chest x-ray shows bibasilar pulmonary infiltrates, possibly inflammatory in etiology.  Patient received ceftriaxone and azithromycin in the emergency department.  WBCs 15.3, hemoglobin 8.1, and platelets 336.  Absolute reticulocyte count 626.  AST 54.  Total bilirubin 9.1.  COVID-19 test pending.  Patient admitted to MedSurg for further management community-acquired pneumonia vs acute chest syndrome in the setting of sickle cell pain crisis.    Review of systems:  In addition to the HPI above, patient  reports No fever or chills No Headache, No changes with vision or hearing No problems swallowing food or liquids No chest pain, cough or shortness of breath No abdominal pain, No nausea or vomiting, Bowel movements are regular No blood in stool or urine No dysuria No new skin rashes or bruises No new joints pains-aches No new weakness, tingling, numbness in any extremity No recent weight gain or loss No polyuria, polydypsia or polyphagia No significant Mental Stressors  A full 10 point Review of Systems was done, except as stated above, all other Review of Systems were negative.  With Past History of the following :   Past Medical History:  Diagnosis Date  . Anxiety   . HIV (human immunodeficiency virus infection) (HCC)   . Proteinuria   . Sickle cell crisis (HCC)   . 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HCC)   . Vitamin D deficiency 10/2018      Past Surgical History:  Procedure Laterality Date  . IR IMAGING GUIDED PORT INSERTION  08/29/2019     Social History:   Social History   Tobacco Use  . Smoking status: Never Smoker  . Smokeless tobacco: Never Used  Substance Use Topics  . Alcohol use: No     Lives - At home   Family History :   Family History  Problem Relation Age of Onset  . Sickle cell trait Mother   . Sickle cell trait Father   . Birth defects Maternal Grandmother   . Birth defects  Paternal Grandmother      Home Medications:   Prior to Admission medications   Medication Sig Start Date End Date Taking? Authorizing Provider  abacavir-dolutegravir-lamiVUDine (TRIUMEQ) 600-50-300 MG tablet Take 1 tablet by mouth at bedtime.    Yes [provider]  apixaban (ELIQUIS) 5 MG TABS tablet Take 1 tablet (5 mg total) by mouth 2 (two) times daily. 08/21/19  Yes Massie MaroonHollis, Skyleigh Windle M, [name]  hydroxyurea (HYDREA) 500 MG capsule Take 4 capsules (2,000 mg total) by mouth at bedtime. 12/08/19  Yes Kallie LocksStroud, Natalie M, [name]  Multiple Vitamin (MULTIVITAMIN WITH MINERALS) TABS  tablet Take 1 tablet by mouth daily with breakfast.    Yes [provider]  oxyCODONE-acetaminophen (PERCOCET) 10-325 MG tablet Take 1 tablet by mouth every 4 (four) hours as needed for up to 15 days for pain. 05/03/20 05/18/20 Yes Kallie LocksStroud, Natalie M, [name]  traZODone (DESYREL) 100 MG tablet Take 1 tablet (100 mg total) by mouth at bedtime as needed for sleep. 03/26/20  Yes Kallie LocksStroud, Natalie M, [name]  Vitamin D, Ergocalciferol, (DRISDOL) 1.25 MG (50000 UT) CAPS capsule Take 1 capsule (50,000 Units total) by mouth every 7 (seven) days. 11/01/18  Yes Kallie LocksStroud, Natalie M, [name]     Allergies:   Allergies  Allergen Reactions  . Tape Rash and Other (See Comments)    PLEASE DO NOT USE THE CLEAR, THICK, "PLASTIC" TAPE- only paper tape is tolerated   . Ketorolac Swelling and Other (See Comments)    Patient reports facial edema and left arm edema after administration.     Physical Exam:   Vitals:   Vitals:   05/13/20 0945 05/13/20 1000  BP: 101/64 100/75  Pulse: 60 61  Resp: 18 13  Temp:    SpO2: 97% 97%    Physical Exam: Constitutional: Patient appears well-developed and well-nourished. Not in obvious distress. HENT: Normocephalic, atraumatic, External right and left ear normal. Oropharynx is clear and moist.  Eyes: Conjunctivae and EOM are normal. PERRLA, no scleral icterus. Neck: Normal ROM. Neck supple. No JVD. No tracheal deviation. No thyromegaly. CVS: RRR, S1/S2 +, no murmurs, no gallops, no carotid bruit.  Pulmonary: Effort and breath sounds normal, no stridor, rhonchi, wheezes, rales.  Abdominal: Soft. BS +, no distension, tenderness, rebound or guarding.  Musculoskeletal: Normal range of motion. No edema and no tenderness.  Lymphadenopathy: No lymphadenopathy noted, cervical, inguinal or axillary Neuro: Alert. Normal reflexes, muscle tone coordination. No cranial nerve deficit. Skin: Skin is warm and dry. No rash noted. Not diaphoretic. No erythema. No pallor. Psychiatric: Normal  mood and affect. Behavior, judgment, thought content normal.   Data Review:   CBC Recent Labs  Lab 05/09/20 1130 05/13/20 0325  WBC 15.2* 15.3*  HGB 8.2* 8.1*  HCT 22.4* 21.5*  PLT 313 336  MCV 100.9* 96.8  MCH 36.9* 36.5*  MCHC 36.6* 37.7*  RDW 27.9* 24.8*  LYMPHSABS 7.7* 8.6*  MONOABS 1.3* 1.3*  EOSABS 0.2 0.2  BASOSABS 0.1 0.1   ------------------------------------------------------------------------------------------------------------------  Chemistries  Recent Labs  Lab 05/09/20 1130 05/13/20 0325  NA 140 137  K 4.5 4.4  CL 106 106  CO2 24 22  GLUCOSE 78 88  BUN 9 15  CREATININE 0.72 0.76  CALCIUM 8.9 8.8*  AST 82* 54*  ALT 41 27  ALKPHOS 66 57  BILITOT 8.2* 9.1*   ------------------------------------------------------------------------------------------------------------------ estimated creatinine clearance is 129.9 mL/min (by C-G formula based on SCr of 0.76 mg/dL). ------------------------------------------------------------------------------------------------------------------ No results for input(s): TSH, T4TOTAL, T3FREE, THYROIDAB in the  last 72 hours.  Invalid input(s): FREET3  Coagulation profile No results for input(s): INR, PROTIME in the last 168 hours. ------------------------------------------------------------------------------------------------------------------- No results for input(s): DDIMER in the last 72 hours. -------------------------------------------------------------------------------------------------------------------  Cardiac Enzymes No results for input(s): CKMB, TROPONINI, MYOGLOBIN in the last 168 hours.  Invalid input(s): CK ------------------------------------------------------------------------------------------------------------------ No results found for: BNP  ---------------------------------------------------------------------------------------------------------------  Urinalysis    Component Value Date/Time    COLORURINE YELLOW 04/01/2020 1742   APPEARANCEUR CLEAR 04/01/2020 1742   LABSPEC 1.024 04/01/2020 1742   PHURINE 5.0 04/01/2020 1742   GLUCOSEU NEGATIVE 04/01/2020 1742   HGBUR NEGATIVE 04/01/2020 1742   BILIRUBINUR NEGATIVE 04/01/2020 1742   BILIRUBINUR neg 11/27/2019 1149   KETONESUR NEGATIVE 04/01/2020 1742   PROTEINUR NEGATIVE 04/01/2020 1742   UROBILINOGEN >=8.0 (A) 11/27/2019 1149   UROBILINOGEN 4.0 (H) 05/21/2009 1944   NITRITE NEGATIVE 04/01/2020 1742   LEUKOCYTESUR NEGATIVE 04/01/2020 1742    ----------------------------------------------------------------------------------------------------------------   Imaging Results:    DG Chest Port 1 View  Result Date: 05/13/2020 CLINICAL DATA:  Chest pain, sickle cell disease EXAM: PORTABLE CHEST 1 VIEW COMPARISON:  04/29/2020 FINDINGS: The lungs are symmetrically well expanded. Subtle airspace infiltrate is seen within the a right lung base and left mid lung zone, possibly inflammatory in etiology. No pneumothorax or pleural effusion. Cardiac size within normal limits. Right internal jugular chest port is in place with its tip within the superior right atrium. No acute bone abnormality. IMPRESSION: Bibasilar pulmonary infiltrates, possibly inflammatory in etiology. Electronically Signed   By: Helyn Numbers MD   On: 05/13/2020 05:52     Assessment & Plan:  Principal Problem:   Sickle cell pain crisis (HCC) Active Problems:   HIV (human immunodeficiency virus infection) (HCC)   Acute chest syndrome due to sickle cell crisis (HCC)   History of pulmonary embolus (PE)   Hypoxia  Sickle cell disease with pain crisis:  0.45% saline at 100 ml/hr IV dilaudid PCA with settings of 0.5 mg, 10 minute lockout, and 3 mg/hr Hold IV Toradol due to listed allergy Hold Percocet, use PCA dilaudid as substitute Monitor vital signs very closely, reevaluate pain scale regularly, and supplemental oxygen as needed Patient will be reevaluated for  pain in the context of function and relationship to baseline as care progresses  CAP vs. Acute chest syndrome:  Chest x-ray shows bibasilar pulmonary infiltrates, possibly inflammatory in etiology.  Continue broad-spectrum antibiotics.  Ceftriaxone IV daily.  Continue azithromycin 500 mg by mouth daily. Incentive spirometer Supplemental oxygen Tylenol 650 mg every 4 hours as needed for fever  HIV disease: Stable.  Followed as an outpatient by infectious disease.  Continue home medications.  History of PE: Continue Eliquis 5 mg twice daily  Sickle cell anemia: Hemoglobin 8.1, consistent with patient's baseline.  There is no clinical indication for blood transfusion at this time.  Continue to monitor closely.  Leukocytosis: Mild leukocytosis.  Continue broad-spectrum antibiotics.  COVID-19 test pending.  Continue to follow closely.  CBC in a.m.   Addendum: Patient positive for COVID-19.  Add remdesivir per pharmacy consult.  Continue supportive care.  Airborne and droplet precautions initiated.   DVT Prophylaxis: Eliquis and SCDs  AM Labs Ordered, also please review Full Orders  Family Communication: Admission, patient's condition and plan of care including tests being ordered have been discussed with the patient who indicate understanding and agree with the plan and Code Status.  Code Status: Full Code  Consults called: None    Admission status: Inpatient  Time spent in minutes : 35 minutes  [name]  APRN, [name], [facility] Patient [facility] [address] [place], [place] [address] [phone]  05/13/2020 at 11:18 AM

## 2020-05-13 NOTE — ED Provider Notes (Signed)
[facility]-EMERGENCY DEPT Provider Note   CSN: [id number] Arrival date & time: 05/13/20  0303     History Chief Complaint  Patient presents with  . Sickle Cell Pain Crisis    Martin [name] is a 30 y.o. male.  Patient is a 30 year old male with past medical history of HIV disease, sickle cell disease.  He presents today for evaluation of "pain all over".  This has worsened over the past 2 days.  Patient has history of sickle cell disease and this feels similar to a sickle cell crisis.  He denies fevers or chills.  He denies cough, or difficulty breathing.  Patient has had no relief with home medications.  The history is provided by the patient.  Sickle Cell Pain Crisis Location:  Diffuse Severity:  Severe Onset quality:  Gradual Duration:  2 days Timing:  Constant Progression:  Worsening Chronicity:  Recurrent Relieved by:  Nothing Worsened by:  Nothing Associated symptoms: no chest pain, no fever and no shortness of breath        Past Medical History:  Diagnosis Date  . Anxiety   . HIV (human immunodeficiency virus infection) (HCC)   . Proteinuria   . Sickle cell crisis (HCC)   . 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HCC)   . Vitamin D deficiency 10/2018    Patient Active Problem List   Diagnosis Date Noted  . Pneumonia, viral 04/04/2020  . Acute and chronic respiratory failure with hypoxia (HCC) 04/03/2020  . 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/18/2020  . 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 Itching of male genitalia 02/22/2020  . Abnormal penile discharge, without blood 02/22/2020  . Leukocytosis 01/02/2020  . Anxiety 11/27/2019  . Proteinuria 11/27/2019  . Scleral icterus   . Chronic, continuous use of opioids 08/15/2019  . Seasonal allergies 08/15/2019  . Hypoxia   . Chest congestion   . Sickle cell anemia with crisis (HCC) 08/04/2019  . Other chronic pain 08/04/2019  . History of pulmonary embolus (PE) 08/04/2019  . Acute pulmonary embolism  (HCC) 02/10/2019  . Acute chest syndrome due to sickle cell crisis (HCC) 02/10/2019  . Sickle cell crisis (HCC) 01/17/2019  . Heart murmur 02/03/2017  . Sickle cell disease with crisis (HCC) 02/02/2017  . 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 Sickle cell pain crisis (HCC) 02/02/2017  . 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HCC) 12/20/2016  . Vitamin D deficiency 08/14/2016  . High risk medication use 09/27/2014  . Generalized anxiety disorder 05/19/2014  . GERD (gastroesophageal reflux disease) 05/19/2014  . HIV (human immunodeficiency virus infection) (HCC) 05/23/2012    Past Surgical History:  Procedure Laterality Date  . IR IMAGING GUIDED PORT INSERTION  08/29/2019       Family History  Problem Relation Age of Onset  . Sickle cell trait Mother   . Sickle cell trait Father   . Birth defects Maternal Grandmother   . Birth defects Paternal Grandmother     Social History   Tobacco Use  . Smoking status: Never Smoker  . Smokeless tobacco: Never Used  Vaping Use  . Vaping Use: Some days  . Substances: THC  Substance Use Topics  . Alcohol use: No  . Drug use: Yes    Types: Marijuana    Home Medications Prior to Admission medications   Medication Sig Start Date End Date Taking? Authorizing Provider  abacavir-dolutegravir-lamiVUDine (TRIUMEQ) 600-50-300 MG tablet Take 1 tablet by mouth at bedtime.     [provider]  apixaban Everlene Balls) 5  MG TABS tablet Take 1 tablet (5 mg total) by mouth 2 (two) times daily. 08/21/19   Massie Maroon, [name]  hydroxyurea (HYDREA) 500 MG capsule Take 4 capsules (2,000 mg total) by mouth at bedtime. 12/08/19   [name], [name]  Multiple Vitamin (MULTIVITAMIN WITH MINERALS) TABS tablet Take 1 tablet by mouth daily with breakfast.     [provider]  naloxone (NARCAN) 0.4 MG/ML injection As needed. Patient taking differently: Place 0.4 mg into the nose as needed (overdose). 11/28/18   [name], [name]  oxyCODONE-acetaminophen  (PERCOCET) 10-325 MG tablet Take 1 tablet by mouth every 4 (four) hours as needed for up to 15 days for pain. 05/03/20 05/18/20  [name], [name]  traZODone (DESYREL) 100 MG tablet Take 1 tablet (100 mg total) by mouth at bedtime as needed for sleep. 03/26/20   [name], [name]  Vitamin D, Ergocalciferol, (DRISDOL) 1.25 MG (50000 UT) CAPS capsule Take 1 capsule (50,000 Units total) by mouth every 7 (seven) days. 11/01/18   [name], [name]    Allergies    Tape and Ketorolac  Review of Systems   Review of Systems  Constitutional: Negative for fever.  Respiratory: Negative for shortness of breath.   Cardiovascular: Negative for chest pain.  All other systems reviewed and are negative.   Physical Exam Updated Vital Signs BP 110/75 (BP Location: Right Arm)   Pulse 73   Temp 98.5 F (36.9 C) (Oral)   Resp 14   Ht 6\' 3"  (1.905 m)   Wt 68 kg   SpO2 100%   BMI 18.75 kg/m   Physical Exam Vitals and nursing note reviewed.  Constitutional:      General: He is not in acute distress.    Appearance: He is well-developed and well-nourished. He is not diaphoretic.  HENT:     Head: Normocephalic and atraumatic.     Mouth/Throat:     Mouth: Oropharynx is clear and moist.  Cardiovascular:     Rate and Rhythm: Normal rate and regular rhythm.     Heart sounds: No murmur heard. No friction rub.  Pulmonary:     Effort: Pulmonary effort is normal. No respiratory distress.     Breath sounds: Normal breath sounds. No wheezing or rales.  Abdominal:     General: Bowel sounds are normal. There is no distension.     Palpations: Abdomen is soft.     Tenderness: There is no abdominal tenderness.  Musculoskeletal:        General: No swelling, tenderness, deformity, signs of injury or edema. Normal range of motion.     Cervical back: Normal range of motion and neck supple.     Right lower leg: No edema.     Left lower leg: No edema.  Skin:    General: Skin is warm and dry.   Neurological:     Mental Status: He is alert and oriented to person, place, and time.     Coordination: Coordination normal.     ED Results / Procedures / Treatments   Labs (all labs ordered are listed, but only abnormal results are displayed) Labs Reviewed  COMPREHENSIVE METABOLIC PANEL  CBC WITH DIFFERENTIAL/PLATELET  RETICULOCYTES    EKG None  Radiology No results found.  Procedures Procedures (including critical care time)  Medications Ordered in ED Medications  sodium chloride 0.9 % bolus 1,000 mL (has no administration in time range)  HYDROmorphone (DILAUDID) injection 2 mg (has no administration  in time range)  diphenhydrAMINE (BENADRYL) capsule 25 mg (has no administration in time range)    ED Course  I have reviewed the triage vital signs and the nursing notes.  Pertinent labs & imaging results that were available during my care of the patient were reviewed by me and considered in my medical decision making (see chart for details).    MDM Rules/Calculators/A&P  Patient with history of sickle cell disease presenting with what he believes to be a sickle cell crisis.  He describes hurting all over.  His laboratory studies are consistent with baseline, but chest x-ray raises the suspicion for inflammation in the bases.  I have reviewed his chest x-ray and the x-ray from his previous visit and do not feel as though there is a significant change.  Patient has received 2 doses of Dilaudid.  He will soon receive a third.  If he is not improving after this, I feel as though consultation with the sickle cell clinic with a open is indicated.  Care will be signed out to Dr. Lynelle Doctor at shift change.  He will reassess the patient and determine the final disposition.  Final Clinical Impression(s) / ED Diagnoses Final diagnoses:  None    Rx / DC Orders ED Discharge Orders    None       Geoffery Lyons, MD 05/14/20 [phone]

## 2020-05-13 NOTE — ED Notes (Signed)
Patient given ginger ale and a fresh urinal.

## 2020-05-14 LAB — CBC
HCT: 21.8 % — ABNORMAL LOW (ref 39.0–52.0)
Hemoglobin: 7.9 g/dL — ABNORMAL LOW (ref 13.0–17.0)
MCH: 35.9 pg — ABNORMAL HIGH (ref 26.0–34.0)
MCHC: 36.2 g/dL — ABNORMAL HIGH (ref 30.0–36.0)
MCV: 99.1 fL (ref 80.0–100.0)
Platelets: 346 10*3/uL (ref 150–400)
RBC: 2.2 MIL/uL — ABNORMAL LOW (ref 4.22–5.81)
RDW: 24.3 % — ABNORMAL HIGH (ref 11.5–15.5)
WBC: 9.8 10*3/uL (ref 4.0–10.5)
nRBC: 0 % (ref 0.0–0.2)

## 2020-05-14 MED ORDER — FLEET ENEMA 7-19 GM/118ML RE ENEM
1.0000 | ENEMA | Freq: Once | RECTAL | Status: AC
  Administered 2020-05-14: 1 via RECTAL
  Filled 2020-05-14: qty 1

## 2020-05-14 MED ORDER — ACETAMINOPHEN 325 MG PO TABS
650.0000 mg | ORAL_TABLET | Freq: Once | ORAL | Status: AC
  Administered 2020-05-14: 650 mg via ORAL
  Filled 2020-05-14: qty 2

## 2020-05-14 MED ORDER — LIP MEDEX EX OINT
TOPICAL_OINTMENT | CUTANEOUS | Status: AC
  Filled 2020-05-14: qty 7

## 2020-05-14 MED ORDER — SODIUM CHLORIDE 0.9% FLUSH
10.0000 mL | INTRAVENOUS | Status: DC | PRN
  Administered 2020-05-16: 10 mL

## 2020-05-14 MED ORDER — CHLORHEXIDINE GLUCONATE CLOTH 2 % EX PADS
6.0000 | MEDICATED_PAD | Freq: Every day | CUTANEOUS | Status: DC
  Administered 2020-05-14 – 2020-05-16 (×3): 6 via TOPICAL

## 2020-05-14 NOTE — Plan of Care (Signed)
  Problem: Clinical Measurements: Goal: Respiratory complications will improve Outcome: Progressing   Problem: Pain Managment: Goal: General experience of comfort will improve Outcome: Progressing   

## 2020-05-14 NOTE — Plan of Care (Signed)
  Problem: Education: Goal: Knowledge of General Education information will improve Description: Including pain rating scale, medication(s)/side effects and non-pharmacologic comfort measures Outcome: Progressing   Problem: Activity: Goal: Risk for activity intolerance will decrease Outcome: Progressing   Problem: Nutrition: Goal: Adequate nutrition will be maintained Outcome: Progressing   Problem: Coping: Goal: Level of anxiety will decrease Outcome: Progressing   Problem: Elimination: Goal: Will not experience complications related to bowel motility Outcome: Progressing   Problem: Pain Managment: Goal: General experience of comfort will improve Outcome: Progressing Patient with PCA dilaudid   Problem: Safety: Goal: Ability to remain free from injury will improve Outcome: Progressing   Problem: Skin Integrity: Goal: Risk for impaired skin integrity will decrease Outcome: Progressing   Problem: Coping: Goal: Psychosocial and spiritual needs will be supported Outcome: Progressing

## 2020-05-14 NOTE — Progress Notes (Signed)
Subjective: Martin [name] is a 30 year old male with a medical history significant for sickle cell disease, chronic pain syndrome, opiate dependence and tolerance, history of HIV disease, history of generalized anxiety, and history of anemia of chronic disease was admitted for COVID-19 infection in the setting of sickle cell pain crisis.  Today, patient says that he is doing well and does not have any new complaints.  Pain intensity has improved to 7/10 primarily to central chest, low back and lower extremities.  Patient's oxygen saturation is 95% on 3 L.  He denies any fever, headache, chills, urinary symptoms, nausea, vomiting, or diarrhea.  Patient does endorse some constipation.  Objective:  Vital signs in last 24 hours:  Vitals:   05/14/20 0134 05/14/20 0442 05/14/20 1049 05/14/20 1403  BP: 100/72 (!) 98/57 (!) 97/52 100/69  Pulse: 74  73 84  Resp: 16 14 18 18   Temp: 98.3 F (36.8 C) 98.2 F (36.8 C) 97.6 F (36.4 C) 98.1 F (36.7 C)  TempSrc: Oral Oral Oral Oral  SpO2: 96% 97% (!) 87% 90%  Weight:      Height:        Intake/Output from previous day:   Intake/Output Summary (Last 24 hours) at 05/14/2020 1703 Last data filed at 05/14/2020 1500 Gross per 24 hour  Intake 1440 ml  Output 2250 ml  Net -810 ml    Physical Exam: General: Alert, awake, oriented x3, in no acute distress.  HEENT: [place]/AT PEERL, EOMI Neck: Trachea midline,  no masses, no thyromegal,y no JVD, no carotid bruit OROPHARYNX:  Moist, No exudate/ erythema/lesions.  Heart: Regular rate and rhythm, without murmurs, rubs, gallops, PMI non-displaced, no heaves or thrills on palpation.  Lungs: Clear to auscultation, no wheezing or rhonchi noted. No increased vocal fremitus resonant to percussion  Abdomen: Soft, nontender, nondistended, positive bowel sounds, no masses no hepatosplenomegaly noted..  Neuro: No focal neurological deficits noted cranial nerves II through XII grossly intact. DTRs 2+ bilaterally  upper and lower extremities. Strength 5 out of 5 in bilateral upper and lower extremities. Musculoskeletal: No warm swelling or erythema around joints, no spinal tenderness noted. Psychiatric: Patient alert and oriented x3, good insight and cognition, good recent to remote recall. Lymph node survey: No cervical axillary or inguinal lymphadenopathy noted.  Lab Results:  Basic Metabolic Panel:    Component Value Date/Time   NA 137 05/13/2020 0325   NA 136 10/17/2018 1433   K 4.4 05/13/2020 0325   CL 106 05/13/2020 0325   CO2 22 05/13/2020 0325   BUN 15 05/13/2020 0325   BUN 6 10/17/2018 1433   CREATININE 0.76 05/13/2020 0325   GLUCOSE 88 05/13/2020 0325   CALCIUM 8.8 (L) 05/13/2020 0325   CBC:    Component Value Date/Time   WBC 9.8 05/14/2020 0504   HGB 7.9 (L) 05/14/2020 0504   HGB 9.6 (L) 10/17/2018 1433   HCT 21.8 (L) 05/14/2020 0504   HCT 26.2 (L) 10/17/2018 1433   PLT 346 05/14/2020 0504   PLT 297 10/17/2018 1433   MCV 99.1 05/14/2020 0504   MCV 108 (H) 10/17/2018 1433   NEUTROABS 5.7 05/13/2020 0325   NEUTROABS 7.0 10/17/2018 1433   LYMPHSABS 8.6 (H) 05/13/2020 0325   LYMPHSABS 4.6 (H) 10/17/2018 1433   MONOABS 1.3 (H) 05/13/2020 0325   EOSABS 0.2 05/13/2020 0325   EOSABS 0.2 10/17/2018 1433   BASOSABS 0.1 05/13/2020 0325   BASOSABS 0.1 10/17/2018 1433    Recent Results (from the past 240  hour(s))  SARS CORONAVIRUS 2 (TAT 6-24 HRS) Nasopharyngeal Nasopharyngeal Swab     Status: Abnormal   Collection Time: 05/13/20  9:04 AM   Specimen: Nasopharyngeal Swab  Result Value Ref Range Status   SARS Coronavirus 2 POSITIVE (A) NEGATIVE Final    Comment: (NOTE) SARS-CoV-2 target nucleic acids are DETECTED.  The SARS-CoV-2 RNA is generally detectable in upper and lower respiratory specimens during the acute phase of infection. Positive results are indicative of the presence of SARS-CoV-2 RNA. Clinical correlation with patient history and other diagnostic information  is  necessary to determine patient infection status. Positive results do not rule out bacterial infection or co-infection with other viruses.  The expected result is Negative.  Fact Sheet for Patients: HairSlick.no  Fact Sheet for Healthcare Providers: quierodirigir.com  This test is not yet approved or cleared by the [place] FDA and  has been authorized for detection and/or diagnosis of SARS-CoV-2 by FDA under an Emergency Use Authorization (EUA). This EUA will remain  in effect (meaning this test can be used) for the duration of the COVID-19 declaration under Section 564(b)(1) of the Act, 21 U. S.C. section 360bbb-3(b)(1), unless the authorization is terminated or revoked sooner.   Performed at [facility], [address], [place], [place] [address]     Studies/Results: DG Chest Port 1 View  Result Date: 05/13/2020 CLINICAL DATA:  Chest pain, sickle cell disease EXAM: PORTABLE CHEST 1 VIEW COMPARISON:  04/29/2020 FINDINGS: The lungs are symmetrically well expanded. Subtle airspace infiltrate is seen within the a right lung base and left mid lung zone, possibly inflammatory in etiology. No pneumothorax or pleural effusion. Cardiac size within normal limits. Right internal jugular chest port is in place with its tip within the superior right atrium. No acute bone abnormality. IMPRESSION: Bibasilar pulmonary infiltrates, possibly inflammatory in etiology. Electronically Signed   By: Helyn Numbers MD   On: 05/13/2020 05:52    Medications: Scheduled Meds: . abacavir-dolutegravir-lamiVUDine  1 tablet Oral QHS  . apixaban  5 mg Oral BID  . azithromycin  500 mg Oral Daily  . Chlorhexidine Gluconate Cloth  6 each Topical Daily  . HYDROmorphone   Intravenous Q4H  . hydroxyurea  2,000 mg Oral QHS  . multivitamin with minerals  1 tablet Oral Q breakfast  . senna-docusate  1 tablet Oral BID   Continuous Infusions: .  cefTRIAXone (ROCEPHIN)  IV 1 g (05/14/20 1642)  . diphenhydrAMINE    . remdesivir 100 mg in NS 100 mL 100 mg (05/14/20 1520)   PRN Meds:.diphenhydrAMINE **OR** diphenhydrAMINE, naloxone **AND** sodium chloride flush, ondansetron (ZOFRAN) IV, polyethylene glycol, sodium chloride flush, traZODone  Consultants:  Pharmacy  Procedures:  None  Antibiotics:  Oral azithromycin  IV ceftriaxone  Assessment/Plan: Principal Problem:   Sickle cell pain crisis (HCC) Active Problems:   HIV (human immunodeficiency virus infection) (HCC)   Acute chest syndrome due to sickle cell crisis (HCC)   History of pulmonary embolus (PE)   Hypoxia   COVID-19  COVID-19 infection: Patient positive for COVID-19.  Currently has an oxygen requirement of 3 L.  Wean to RA as tolerated.  Continue supportive care.  Remdesivir initiated per pharmacy consult. Continue to follow closely  Sickle cell anemia: Continue IV Dilaudid PCA without any changes in settings Hold IV Toradol due to listed allergy Percocet 10-325 mg every 4 hours as needed for severe breakthrough pain Monitor vital signs very closely, reevaluate pain scale regularly, and supplemental oxygen as needed.  Sickle cell anemia: Hemoglobin is stable and consistent with patient's baseline.  There is no clinical indication for blood transfusion at this time.  Continue to follow closely.  Leukocytosis: Mild leukocytosis.  Patient positive for COVID-19.  Remdesivir initiated.  Community-acquired pneumonia versus acute chest syndrome: Most recent chest x-ray shows bibasilar pulmonary infiltrates.  We will continue IV ceftriaxone and oral azithromycin at this time. Supplemental oxygen. Incentive spirometer.  HIV disease: Stable.  Continue home medication.  History of PE: Continue Eliquis 5 mg twice daily    Code Status: Full Code Family Communication: N/A Disposition Plan: Not yet ready for discharge   Marnee Sherrard Rennis Petty  APRN, [name],  [name] [facility] [address] [place], [place] [address] [phone]  If 5PM-8AM, please contact night-coverage.  05/14/2020, 5:03 PM  LOS: 1 day

## 2020-05-14 NOTE — Progress Notes (Signed)
Patient arrived from ER via stretcher accompanied by RN. Pt stable upon arrival on 2L o2. Port intact. Pt oriented to room and orders and plan of care reviewed. Will continue to monitor.

## 2020-05-15 ENCOUNTER — Ambulatory Visit: Payer: Medicaid Other | Admitting: Family Medicine

## 2020-05-15 DIAGNOSIS — U071 COVID-19: Secondary | ICD-10-CM

## 2020-05-15 DIAGNOSIS — D5701 Hb-SS disease with acute chest syndrome: Principal | ICD-10-CM

## 2020-05-15 LAB — CBC
HCT: 8.7 % — ABNORMAL LOW (ref 39.0–52.0)
Hemoglobin: 7.7 g/dL — ABNORMAL LOW (ref 13.0–17.0)
MCH: 88.5 pg — ABNORMAL HIGH (ref 26.0–34.0)
MCHC: 88.5 g/dL — ABNORMAL HIGH (ref 30.0–36.0)
MCV: 100 fL (ref 80.0–100.0)
Platelets: 298 10*3/uL (ref 150–400)
RBC: 0.87 MIL/uL — ABNORMAL LOW (ref 4.22–5.81)
RDW: 29.6 % — ABNORMAL HIGH (ref 11.5–15.5)
WBC: 10.2 10*3/uL (ref 4.0–10.5)
nRBC: 0.2 % (ref 0.0–0.2)

## 2020-05-15 MED ORDER — ACETAMINOPHEN 325 MG PO TABS
325.0000 mg | ORAL_TABLET | Freq: Once | ORAL | Status: AC
  Administered 2020-05-15: 325 mg via ORAL
  Filled 2020-05-15: qty 1

## 2020-05-15 MED ORDER — LIP MEDEX EX OINT
TOPICAL_OINTMENT | CUTANEOUS | Status: AC
  Filled 2020-05-15: qty 7

## 2020-05-15 MED ORDER — FLEET ENEMA 7-19 GM/118ML RE ENEM
1.0000 | ENEMA | Freq: Every day | RECTAL | Status: DC | PRN
  Administered 2020-05-15 – 2020-05-16 (×2): 1 via RECTAL
  Filled 2020-05-15 (×2): qty 1

## 2020-05-15 NOTE — Progress Notes (Signed)
Patient has yellow mews, complains of headache despite previous intervention, hospitalitist on call notified.

## 2020-05-15 NOTE — Progress Notes (Signed)
Subjective: Patient has no new complaints on today.  Pain intensity has improved to 6/10.  Patient's oxygen saturation has improved, 98% on 2 L.  He denies any fever, headache, chills, urinary symptoms, nausea, vomiting, or diarrhea.   Objective:  Vital signs in last 24 hours:  Vitals:   05/15/20 0411 05/15/20 0800 05/15/20 1208 05/15/20 1324  BP:   100/63   Pulse:   80   Resp: 15 12 16    Temp:   98.3 F (36.8 C)   TempSrc:   Oral   SpO2: 98% 94% 93% 91%  Weight:      Height:        Intake/Output from previous day:   Intake/Output Summary (Last 24 hours) at 05/15/2020 1622 Last data filed at 05/15/2020 1500 Gross per 24 hour  Intake 1556 ml  Output 1500 ml  Net 56 ml    Physical Exam: General: Alert, awake, oriented x3, in no acute distress.  HEENT: [place]/AT PEERL, EOMI Neck: Trachea midline,  no masses, no thyromegal,y no JVD, no carotid bruit OROPHARYNX:  Moist, No exudate/ erythema/lesions.  Heart: Regular rate and rhythm, without murmurs, rubs, gallops, PMI non-displaced, no heaves or thrills on palpation.  Lungs: Clear to auscultation, no wheezing or rhonchi noted. No increased vocal fremitus resonant to percussion  Abdomen: Soft, nontender, nondistended, positive bowel sounds, no masses no hepatosplenomegaly noted..  Neuro: No focal neurological deficits noted cranial nerves II through XII grossly intact. DTRs 2+ bilaterally upper and lower extremities. Strength 5 out of 5 in bilateral upper and lower extremities. Musculoskeletal: No warm swelling or erythema around joints, no spinal tenderness noted. Psychiatric: Patient alert and oriented x3, good insight and cognition, good recent to remote recall. Lymph node survey: No cervical axillary or inguinal lymphadenopathy noted.  Lab Results:  Basic Metabolic Panel:    Component Value Date/Time   NA 137 05/13/2020 0325   NA 136 10/17/2018 1433   K 4.4 05/13/2020 0325   CL 106 05/13/2020 0325   CO2 22 05/13/2020 0325    BUN 15 05/13/2020 0325   BUN 6 10/17/2018 1433   CREATININE 0.76 05/13/2020 0325   GLUCOSE 88 05/13/2020 0325   CALCIUM 8.8 (L) 05/13/2020 0325   CBC:    Component Value Date/Time   WBC 10.2 05/15/2020 0300   HGB 7.7 (L) 05/15/2020 0300   HGB 9.6 (L) 10/17/2018 1433   HCT 8.7 (L) 05/15/2020 0300   HCT 26.2 (L) 10/17/2018 1433   PLT 298 05/15/2020 0300   PLT 297 10/17/2018 1433   MCV 100.0 05/15/2020 0300   MCV 108 (H) 10/17/2018 1433   NEUTROABS 5.7 05/13/2020 0325   NEUTROABS 7.0 10/17/2018 1433   LYMPHSABS 8.6 (H) 05/13/2020 0325   LYMPHSABS 4.6 (H) 10/17/2018 1433   MONOABS 1.3 (H) 05/13/2020 0325   EOSABS 0.2 05/13/2020 0325   EOSABS 0.2 10/17/2018 1433   BASOSABS 0.1 05/13/2020 0325   BASOSABS 0.1 10/17/2018 1433    Recent Results (from the past 240 hour(s))  SARS CORONAVIRUS 2 (TAT 6-24 HRS) Nasopharyngeal Nasopharyngeal Swab     Status: Abnormal   Collection Time: 05/13/20  9:04 AM   Specimen: Nasopharyngeal Swab  Result Value Ref Range Status   SARS Coronavirus 2 POSITIVE (A) NEGATIVE Final    Comment: (NOTE) SARS-CoV-2 target nucleic acids are DETECTED.  The SARS-CoV-2 RNA is generally detectable in upper and lower respiratory specimens during the acute phase of infection. Positive results are indicative of the presence of SARS-CoV-2 RNA. Clinical  correlation with patient history and other diagnostic information is  necessary to determine patient infection status. Positive results do not rule out bacterial infection or co-infection with other viruses.  The expected result is Negative.  Fact Sheet for Patients: HairSlick.no  Fact Sheet for Healthcare Providers: quierodirigir.com  This test is not yet approved or cleared by the [place] FDA and  has been authorized for detection and/or diagnosis of SARS-CoV-2 by FDA under an Emergency Use Authorization (EUA). This EUA will remain  in effect  (meaning this test can be used) for the duration of the COVID-19 declaration under Section 564(b)(1) of the Act, 21 U. S.C. section 360bbb-3(b)(1), unless the authorization is terminated or revoked sooner.   Performed at [facility], [address] [place], [place] [address]     Studies/Results: No results found.  Medications: Scheduled Meds: . abacavir-dolutegravir-lamiVUDine  1 tablet Oral QHS  . apixaban  5 mg Oral BID  . azithromycin  500 mg Oral Daily  . Chlorhexidine Gluconate Cloth  6 each Topical Daily  . HYDROmorphone   Intravenous Q4H  . hydroxyurea  2,000 mg Oral QHS  . lip balm      . multivitamin with minerals  1 tablet Oral Q breakfast  . senna-docusate  1 tablet Oral BID   Continuous Infusions: . cefTRIAXone (ROCEPHIN)  IV 1 g (05/15/20 0948)  . diphenhydrAMINE    . remdesivir 100 mg in NS 100 mL 100 mg (05/15/20 1033)   PRN Meds:.diphenhydrAMINE **OR** diphenhydrAMINE, naloxone **AND** sodium chloride flush, ondansetron (ZOFRAN) IV, polyethylene glycol, sodium chloride flush, sodium phosphate  Consultants:  None  Procedures:  None  Antibiotics:  None  Assessment/Plan: Principal Problem:   Sickle cell pain crisis (HCC) Active Problems:   HIV (human immunodeficiency virus infection) (HCC)   Acute chest syndrome due to sickle cell crisis (HCC)   History of pulmonary embolus (PE)   Hypoxia   COVID-19  COVID-19 infection: Continue remdesivir per pharmacy consult Oxygen requirement decreased to 2 L.  Oxygen saturation 98%.  Continue supportive care.  Sickle cell anemia with pain crisis: Continue IV Dilaudid PCA, decrease settings Continue to hold IV Toradol due to listed allergy Percocet 10-325 mg every 4 hours as needed for severe breakthrough pain Monitor vital signs very closely, reevaluate pain scale regularly, and supplemental oxygen as needed.  Sickle cell anemia: Hemoglobin is stable and consistent with patient's baseline.   There is no clinical indication for blood transfusion at this time.  Continue to follow closely.  Leukocytosis: Improving.  Patient positive for Covid.  Continue remdesivir.  Monitor closely.  CBC in a.m.  Community-acquired pneumonia versus acute chest syndrome: Most recent chest x-ray shows bibasilar pulmonary infiltrates.  Continue IV ceftriaxone and oral azithromycin.  Supplemental oxygen required.  Incentive spirometer.  HIV disease: Stable.  Continue home medication.  History of PE: Continue Eliquis 5 mg twice daily.  Hypoxia: Saturation 92% on 3 L.  Titrate to room air as tolerated.  Code Status: Full Code Family Communication: N/A Disposition Plan: Not yet ready for discharge   Allona Gondek Rennis Petty  APRN, [name], [name] [facility] [address] [place], [place] [address] [phone]  If 5PM-8AM, please contact night-coverage.  05/15/2020, 4:22 PM  LOS: 2 days

## 2020-05-16 ENCOUNTER — Inpatient Hospital Stay (HOSPITAL_COMMUNITY): Payer: Medicaid Other

## 2020-05-16 LAB — COMPREHENSIVE METABOLIC PANEL
ALT: 26 U/L (ref 0–44)
AST: 50 U/L — ABNORMAL HIGH (ref 15–41)
Albumin: 3.6 g/dL (ref 3.5–5.0)
Alkaline Phosphatase: 50 U/L (ref 38–126)
Anion gap: 9 (ref 5–15)
BUN: 11 mg/dL (ref 6–20)
CO2: 28 mmol/L (ref 22–32)
Calcium: 8.7 mg/dL — ABNORMAL LOW (ref 8.9–10.3)
Chloride: 101 mmol/L (ref 98–111)
Creatinine, Ser: 0.82 mg/dL (ref 0.61–1.24)
GFR, Estimated: >60 mL/min (ref >60)
Glucose, Bld: 121 mg/dL — ABNORMAL HIGH (ref 70–99)
Potassium: 4.1 mmol/L (ref 3.5–5.1)
Sodium: 138 mmol/L (ref 135–145)
Total Bilirubin: 3.3 mg/dL — ABNORMAL HIGH (ref 0.3–1.2)
Total Protein: 8.6 g/dL — ABNORMAL HIGH (ref 6.5–8.1)

## 2020-05-16 LAB — CBC WITH DIFFERENTIAL/PLATELET
Abs Immature Granulocytes: 0.02 10*3/uL (ref 0.00–0.07)
Basophils Absolute: 0 10*3/uL (ref 0.0–0.1)
Basophils Relative: 0 %
Eosinophils Absolute: 0.2 10*3/uL (ref 0.0–0.5)
Eosinophils Relative: 2 %
HCT: 19.1 % — ABNORMAL LOW (ref 39.0–52.0)
Hemoglobin: 7.3 g/dL — ABNORMAL LOW (ref 13.0–17.0)
Immature Granulocytes: 0 %
Lymphocytes Relative: 65 %
Lymphs Abs: 6.6 10*3/uL — ABNORMAL HIGH (ref 0.7–4.0)
MCH: 36 pg — ABNORMAL HIGH (ref 26.0–34.0)
MCHC: 37.2 g/dL — ABNORMAL HIGH (ref 30.0–36.0)
MCV: 94.1 fL (ref 80.0–100.0)
Monocytes Absolute: 0.5 10*3/uL (ref 0.1–1.0)
Monocytes Relative: 4 %
Neutro Abs: 2.9 10*3/uL (ref 1.7–7.7)
Neutrophils Relative %: 29 %
Platelets: 332 10*3/uL (ref 150–400)
RBC: 2.03 MIL/uL — ABNORMAL LOW (ref 4.22–5.81)
RDW: 21.8 % — ABNORMAL HIGH (ref 11.5–15.5)
WBC: 10.8 10*3/uL — ABNORMAL HIGH (ref 4.0–10.5)
nRBC: 1.2 % — ABNORMAL HIGH (ref 0.0–0.2)

## 2020-05-16 LAB — LACTATE DEHYDROGENASE: LDH: 579 U/L — ABNORMAL HIGH (ref 98–192)

## 2020-05-16 MED ORDER — OXYCODONE HCL 5 MG PO TABS: 5.0000 mg | ORAL_TABLET | ORAL | Status: DC | PRN

## 2020-05-16 MED ORDER — HYDROMORPHONE 1 MG/ML IV SOLN: INTRAVENOUS | Status: DC

## 2020-05-16 MED ORDER — OXYCODONE-ACETAMINOPHEN 5-325 MG PO TABS: 1.0000 | ORAL_TABLET | ORAL | Status: DC | PRN

## 2020-05-16 MED ORDER — HEPARIN SOD (PORK) LOCK FLUSH 100 UNIT/ML IV SOLN
500.0000 [IU] | INTRAVENOUS | Status: AC | PRN
  Administered 2020-05-16: 500 [IU]

## 2020-05-16 NOTE — Progress Notes (Signed)
SATURATION QUALIFICATIONS: (This note is used to comply with regulatory documentation for home oxygen)  Patient Saturations on Room Air at Rest =84 %  Patient Saturations on Room Air while Ambulating = 81%  Patient Saturations on 2 Liters of oxygen while Ambulating = 88%  Please briefly explain why patient needs home oxygen:unable to maintain oxygen saturation without it

## 2020-05-16 NOTE — Discharge Summary (Addendum)
Physician Discharge Summary  Martin [name] OEU:[id number] DOB: 1990/05/15 DOA: 05/13/2020  PCP: [name], [name]  Admit date: 05/13/2020  Discharge date: 05/16/2020  Discharge Diagnoses:  Principal Problem:   Sickle cell pain crisis (HCC) Active Problems:   HIV (human immunodeficiency virus infection) (HCC)   Acute chest syndrome due to sickle cell crisis (HCC)   History of pulmonary embolus (PE)   Hypoxia   COVID-19   Discharge Condition: Patient leaving AGAINST MEDICAL ADVICE  Disposition:   Follow-up Information    [name], [name] Follow up in 2 week(s).   Specialty: Family Medicine Contact information: [address] [place] [address] [phone]              Pt is discharged home in good condition and is to follow up with [name], [name] this week to have labs evaluated. Martin [name] is instructed to increase activity slowly and balance with rest for the next few days, and use prescribed medication to complete treatment of pain  Diet: Regular Wt Readings from Last 3 Encounters:  05/13/20 68 kg  04/29/20 68 kg  04/04/20 70.5 kg    History of present illness:  Martin [name]  is a 30 y.o. male with a medical history significant for sickle cell disease, chronic pain syndrome, opiate dependence and tolerance, history of HIV disease, history of generalized anxiety, and history of anemia of chronic disease presents with complaints of generalized pain that is consistent with his previous pain crisis.  Patient says that pain is primarily to central chest, low back, and lower extremities.  He says the pain intensity increased suddenly on last night.  He attributes pain crisis to changes in weather.  He has been taking Percocet at home consistently without sustained relief.  His pain intensity is 10/10 characterized as constant and throbbing.  He denies any headache, shortness of breath, urinary symptoms, nausea, vomiting, or diarrhea.   No sick contact, recent travel, or known exposure to COVID-19.  Of note, patient has been vaccinated against COVID-19.  ER course: Vital signs recorded as: BP 109/74   Pulse 64   Temp 98.5 F (36.9 C) (Oral)   Resp (!) 8   Ht 6\' 3"  (1.905 m)   Wt 68 kg   SpO2 95%   BMI 18.75 kg/m Oxygen saturation 80-90s on RA, improved with 2 L supplemental oxygen.  Chest x-ray shows bibasilar pulmonary infiltrates, possibly inflammatory in etiology.  Patient received ceftriaxone and azithromycin in the emergency department.  WBCs 15.3, hemoglobin 8.1, and platelets 336.  Absolute reticulocyte count 626.  AST 54.  Total bilirubin 9.1.  COVID-19 test pending.  Patient admitted to MedSurg for further management community-acquired pneumonia vs acute chest syndrome in the setting of sickle cell pain crisis.   Hospital Course:  Sickle cell disease: Patient was admitted for sickle cell pain crisis and managed appropriately with IVF, IV Dilaudid via PCA, as well as other adjunct therapies per sickle cell pain management protocols.  Pain intensity has decreased to 5/10.  Patient transition to home medications.  He is requesting discharge home today.  COVID 19 infection:  Patient has COVID-19 infection.  Remdesivir was initiated per pharmacy consult.  Patient has completed and does not require further medication. Patient is on home oxygen at 2 L.  Ambulating pulse ox suggests the patient is maintaining oxygen saturation above 90% on 2 L.  He will discharge home with continuous supplemental oxygen.  Follow-up with PCP in 2  weeks for 6-minute walk test.  Patient says that home oxygen is managed by local home health agency and [place].  Covid 19 quarantine discussed at length, discussed CDC guidelines.  Patient expressed understanding. Community-acquired pneumonia versus acute chest syndrome: IV ceftriaxone and oral azithromycin were initiated.  Patient completed azithromycin.  He will complete treatment with  Augmentin 875-125 mg every 12 hours for an additional 7 days. Follow-up with PCP to repeat chest x-ray in 4 weeks.  Sickle cell anemia: Hemoglobin is 7.3, which is consistent with his baseline of 7.0-8.0 g/dL.  No clinical indication for blood transfusion.  Follow-up with PCP as scheduled follow-up appointment to repeat CBC with differential.  Patient is typically not transfused unless hemoglobin is less than 7.  HIV disease: Stable.  Patient is followed by infectious disease as an outpatient.  Continue antiviral medication regimen.  Patient is alert, oriented, and ambulating without assistance.  He is afebrile. Patient was therefore discharged home today in a hemodynamically stable condition.  Patient is leaving AGAINST MEDICAL ADVICE Discharge Exam: Vitals:   05/16/20 1247 05/16/20 1247  BP:  (!) 96/54  Pulse:  72  Resp: 16 14  Temp:  98.7 F (37.1 C)  SpO2: 92% 95%   Vitals:   05/16/20 1010 05/16/20 1038 05/16/20 1247 05/16/20 1247  BP: 107/70   (!) 96/54  Pulse: 75   72  Resp: 15 16 16 14   Temp: 98.3 F (36.8 C)   98.7 F (37.1 C)  TempSrc: Oral   Oral  SpO2: 96% 92% 92% 95%  Weight:      Height:        General appearance : Awake, alert, not in any distress. Speech Clear. Not toxic looking HEENT: Atraumatic and Normocephalic, pupils equally reactive to light and accomodation Neck: Supple, no JVD. No cervical lymphadenopathy.  Chest: Good air entry bilaterally, no added sounds  CVS: S1 S2 regular, no murmurs.  Abdomen: Bowel sounds present, Non tender and not distended with no gaurding, rigidity or rebound. Extremities: B/L Lower Ext shows no edema, both legs are warm to touch Neurology: Awake alert, and oriented X 3, CN II-XII intact, Non focal Skin: No Rash  Discharge Instructions  Discharge Instructions    Discharge patient   Complete by: As directed    Discharge disposition: 01-Home or Self Care   Discharge patient date: 05/16/2020     Allergies as of  05/16/2020      Reactions   Tape Rash, Other (See Comments)   PLEASE DO NOT USE THE CLEAR, THICK, "PLASTIC" TAPE- only paper tape is tolerated    Ketorolac Swelling, Other (See Comments)   Patient reports facial edema and left arm edema after administration.      Medication List    TAKE these medications   apixaban 5 MG Tabs tablet Commonly known as: Eliquis Take 1 tablet (5 mg total) by mouth 2 (two) times daily.   hydroxyurea 500 MG capsule Commonly known as: HYDREA Take 4 capsules (2,000 mg total) by mouth at bedtime.   multivitamin with minerals Tabs tablet Take 1 tablet by mouth daily with breakfast.   oxyCODONE-acetaminophen 10-325 MG tablet Commonly known as: PERCOCET Take 1 tablet by mouth every 4 (four) hours as needed for up to 15 days for pain.   traZODone 100 MG tablet Commonly known as: DESYREL Take 1 tablet (100 mg total) by mouth at bedtime as needed for sleep.   Triumeq 600-50-300 MG tablet Generic drug: abacavir-dolutegravir-lamiVUDine Take 1 tablet by mouth  at bedtime.   Vitamin D (Ergocalciferol) 1.25 MG (50000 UNIT) Caps capsule Commonly known as: DRISDOL Take 1 capsule (50,000 Units total) by mouth every 7 (seven) days.            Durable Medical Equipment  (From admission, onward)         Start     Ordered   05/16/20 1556  For home use only DME oxygen  Once       Question Answer Comment  Length of Need 6 Months   Mode or (Route) Nasal cannula   Liters per Minute 2   Frequency Continuous (stationary and portable oxygen unit needed)   Oxygen conserving device Yes   Oxygen delivery system Gas      05/16/20 1555          The results of significant diagnostics from this hospitalization (including imaging, microbiology, ancillary and laboratory) are listed below for reference.    Significant Diagnostic Studies: DG Chest 1 View  Result Date: 05/16/2020 CLINICAL DATA:  Sickle cell crisis. EXAM: CHEST  1 VIEW COMPARISON:  05/13/2020  FINDINGS: Port-A-Cath noted good anatomic position. Cardiomegaly. Diffuse bilateral interstitial prominence. Interstitial edema and/or pneumonitis could present this fashion. For findings noted on prior exam. No pleural effusion or pneumothorax. No acute bony abnormality. IMPRESSION: 1. Cardiomegaly. 2. Diffuse bilateral interstitial prominence. Interstitial edema and/or pneumonitis could present in this fashion. Similar findings noted on prior exam. Electronically Signed   By: Maisie Fus  Register   On: 05/16/2020 11:33   DG Chest Port 1 View  Result Date: 05/13/2020 CLINICAL DATA:  Chest pain, sickle cell disease EXAM: PORTABLE CHEST 1 VIEW COMPARISON:  04/29/2020 FINDINGS: The lungs are symmetrically well expanded. Subtle airspace infiltrate is seen within the a right lung base and left mid lung zone, possibly inflammatory in etiology. No pneumothorax or pleural effusion. Cardiac size within normal limits. Right internal jugular chest port is in place with its tip within the superior right atrium. No acute bone abnormality. IMPRESSION: Bibasilar pulmonary infiltrates, possibly inflammatory in etiology. Electronically Signed   By: Helyn Numbers MD   On: 05/13/2020 05:52   DG Chest Port 1 View  Result Date: 04/29/2020 CLINICAL DATA:  Chest pain EXAM: PORTABLE CHEST 1 VIEW COMPARISON:  Radiograph 04/03/2020, CT 04/01/2020 FINDINGS: Accessed right IJ approach Port-A-Cath tip terminates at the superior cavoatrial junction. Telemetry leads overlie the chest. There is some chronically coarsened interstitial and bronchitic features within the lungs. No clear superimposed acute abnormality is evident. No pneumothorax or effusion. Stable cardiomediastinal contours. No acute osseous or soft tissue abnormality. IMPRESSION: 1. Chronic interstitial and bronchitic features within the lungs. 2. No clear superimposed acute cardiopulmonary process is evident. Electronically Signed   By: Kreg Shropshire M.D.   On: 04/29/2020  05:43    Microbiology: Recent Results (from the past 240 hour(s))  SARS CORONAVIRUS 2 (TAT 6-24 HRS) Nasopharyngeal Nasopharyngeal Swab     Status: Abnormal   Collection Time: 05/13/20  9:04 AM   Specimen: Nasopharyngeal Swab  Result Value Ref Range Status   SARS Coronavirus 2 POSITIVE (A) NEGATIVE Final    Comment: (NOTE) SARS-CoV-2 target nucleic acids are DETECTED.  The SARS-CoV-2 RNA is generally detectable in upper and lower respiratory specimens during the acute phase of infection. Positive results are indicative of the presence of SARS-CoV-2 RNA. Clinical correlation with patient history and other diagnostic information is  necessary to determine patient infection status. Positive results do not rule out bacterial infection or co-infection with other  viruses.  The expected result is Negative.  Fact Sheet for Patients: HairSlick.no  Fact Sheet for Healthcare Providers: quierodirigir.com  This test is not yet approved or cleared by the [place] FDA and  has been authorized for detection and/or diagnosis of SARS-CoV-2 by FDA under an Emergency Use Authorization (EUA). This EUA will remain  in effect (meaning this test can be used) for the duration of the COVID-19 declaration under Section 564(b)(1) of the Act, 21 U. S.C. section 360bbb-3(b)(1), unless the authorization is terminated or revoked sooner.   Performed at [facility], [address], [place], [place] [address]      Labs: Basic Metabolic Panel: Recent Labs  Lab 05/13/20 0325 05/16/20 1140  NA 137 138  K 4.4 4.1  CL 106 101  CO2 22 28  GLUCOSE 88 121*  BUN 15 11  CREATININE 0.76 0.82  CALCIUM 8.8* 8.7*   Liver Function Tests: Recent Labs  Lab 05/13/20 0325 05/16/20 1140  AST 54* 50*  ALT 27 26  ALKPHOS 57 50  BILITOT 9.1* 3.3*  PROT 8.7* 8.6*  ALBUMIN 3.7 3.6   No results for input(s): LIPASE, AMYLASE in the last 168  hours. No results for input(s): AMMONIA in the last 168 hours. CBC: Recent Labs  Lab 05/13/20 0325 05/14/20 0504 05/15/20 0300 05/16/20 1140  WBC 15.3* 9.8 10.2 10.8*  NEUTROABS 5.7  --   --  2.9  HGB 8.1* 7.9* 7.7* 7.3*  HCT 21.5* 21.8* 8.7* 19.1*  MCV 96.8 99.1 100.0 94.1  PLT 336 346 298 332   Cardiac Enzymes: No results for input(s): CKTOTAL, CKMB, CKMBINDEX, TROPONINI in the last 168 hours. BNP: Invalid input(s): POCBNP CBG: No results for input(s): GLUCAP in the last 168 hours.  Time coordinating discharge: Patient is refusing to wait on home oxygen, he is leaving AGAINST MEDICAL ADVICE  Signed:  [name]  APRN, [name], [name] Patient Care [facility] [address] [place], [place] [address] [phone]  [facility] 05/16/2020, 3:59 PM

## 2020-05-16 NOTE — Progress Notes (Addendum)
The company that provides patient's oxygen will not be able to bring patient's oxygen until 05/17/2020.  Patient is refusing to wait on home oxygen prior to discharge.  He states that he had a family member passed away and has to leave immediately.  Patient's company that provide oxygen is in [place].  Patient does not have any family member that can bring his home oxygen. Resending discharge.   [name]  APRN, [name], [name] Patient Care [facility] [address] [place], [place] [address] [phone]

## 2020-05-16 NOTE — Discharge Instructions (Signed)
Sickle Cell Anemia, Adult  Sickle cell anemia is a condition where your red blood cells are shaped like sickles. Red blood cells carry oxygen through the body. Sickle-shaped cells do not live as long as normal red blood cells. They also clump together and block blood from flowing through the blood vessels. This prevents the body from getting enough oxygen. Sickle cell anemia causes organ damage and pain. It also increases the risk of infection. Follow these instructions at home: Medicines  Take over-the-counter and prescription medicines only as told by your doctor.  If you were prescribed an antibiotic medicine, take it as told by your doctor. Do not stop taking the antibiotic even if you start to feel better.  If you develop a fever, do not take medicines to lower the fever right away. Tell your doctor about the fever. Managing pain, stiffness, and swelling  Try these methods to help with pain: ? Use a heating pad. ? Take a warm bath. ? Distract yourself, such as by watching TV. Eating and drinking  Drink enough fluid to keep your pee (urine) clear or pale yellow. Drink more in hot weather and during exercise.  Limit or avoid alcohol.  Eat a healthy diet. Eat plenty of fruits, vegetables, whole grains, and lean protein.  Take vitamins and supplements as told by your doctor. Traveling  When traveling, keep these with you: ? Your medical information. ? The names of your doctors. ? Your medicines.  If you need to take an airplane, talk to your doctor first. Activity  Rest often.  Avoid exercises that make your heart beat much faster, such as jogging. General instructions  Do not use products that have nicotine or tobacco, such as cigarettes and e-cigarettes. If you need help quitting, ask your doctor.  Consider wearing a medical alert bracelet.  Avoid being in high places (high altitudes), such as mountains.  Avoid very hot or cold temperatures.  Avoid places where  the temperature changes a lot.  Keep all follow-up visits as told by your doctor. This is important. Contact a doctor if:  A joint hurts.  Your feet or hands hurt or swell.  You feel tired (fatigued). Get help right away if:  You have symptoms of infection. These include: ? Fever. ? Chills. ? Being very tired. ? Irritability. ? Poor eating. ? Throwing up (vomiting).  You feel dizzy or faint.  You have new stomach pain, especially on the left side.  You have a an erection (priapism) that lasts more than 4 hours.  You have numbness in your arms or legs.  You have a hard time moving your arms or legs.  You have trouble talking.  You have pain that does not go away when you take medicine.  You are short of breath.  You are breathing fast.  You have a long-term cough.  You have pain in your chest.  You have a bad headache.  You have a stiff neck.  Your stomach looks bloated even though you did not eat much.  Your skin is pale.  You suddenly cannot see well. Summary  Sickle cell anemia is a condition where your red blood cells are shaped like sickles.  Follow your doctor's advice on ways to manage pain, food to eat, activities to do, and steps to take for safe travel.  Get medical help right away if you have any signs of infection, such as a fever. This information is not intended to replace advice given to you  by your health care provider. Make sure you discuss any questions you have with your health care provider. Document Revised: 08/31/2019 Document Reviewed: 08/31/2019 Elsevier Patient Education  2021 Elsevier Inc. COVID-19 Quarantine vs. Isolation QUARANTINE keeps someone who was in close contact with someone who has COVID-19 away from others. Quarantine if you have been in close contact with someone who has COVID-19, unless you have been fully vaccinated. If you are fully vaccinated  You do NOT need to quarantine unless they have symptoms  Get  tested 3-5 days after your exposure, even if you don't have symptoms  Wear a mask indoors in public for 14 days following exposure or until your test result is negative If you are not fully vaccinated  Stay home for 14 days after your last contact with a person who has COVID-19  Watch for fever (100.40F), cough, shortness of breath, or other symptoms of COVID-19  If possible, stay away from people you live with, especially people who are at higher risk for getting very sick from COVID-19  Contact your local public health department for options in your area to possibly shorten your quarantine ISOLATION keeps someone who is sick or tested positive for COVID-19 without symptoms away from others, even in their own home. People who are in isolation should stay home and stay in a specific "sick room" or area and use a separate bathroom (if available). If you are sick and think or know you have COVID-19 Stay home until after  At least 10 days since symptoms first appeared and  At least 24 hours with no fever without the use of fever-reducing medications and  Symptoms have improved If you tested positive for COVID-19 but do not have symptoms  Stay home until after 10 days have passed since your positive viral test  If you develop symptoms after testing positive, follow the steps above for those who are sick [email or web address] 01/15/2020 This information is not intended to replace advice given to you by your health care provider. Make sure you discuss any questions you have with your health care provider. Document Revised: 02/19/2020 Document Reviewed: 02/19/2020 Elsevier Patient Education  2021 [employer].

## 2020-05-16 NOTE — TOC Transition Note (Signed)
Transition of Care [facility]) - CM/SW Discharge Note   Patient Details  Name: Martin [name] MRN: [id number] Date of Birth: June 26, 1990  Transition of Care [facility]) CM/SW Contact:  Amada Jupiter, LCSW Phone Number: 05/16/2020, 5:37 PM   Clinical Narrative:    Alerted by RN that orders in for home oxygen and pt being dc'd today.  Have contacted [facility] in [place] who is supplier of oxygen, however, he uses only prn prior to this hospitalization.  Pt now requires 2l continuous so new orders sent to agency for additional supplies to be delivered to home. Unfortunately, this agency is unable to provide a portable tank to pt for travel home until tomorrow.  Have alerted pt and he says there is no person who could go by his home and bring one of his tanks to the hospital.  After additional questioning, pt reports his plan for return home is to use [employer] to return to local hotel where his car is and then drive himself home.  MD/ NP aware of this plan and notes this is not safe for pt to do without oxygen.  Pt was also offered PTAR transport which he has declined.  Pt may decide to dc AMA - currently under discussion.    Final next level of care: Home/Self Care     Patient Goals and CMS Choice        Discharge Placement                       Discharge Plan and Services                DME Arranged: Oxygen DME Agency: Other - Comment [facility] [place] [place]) Date DME Agency Contacted: 05/16/20 Time DME Agency Contacted: 1415              Social Determinants of Health (SDOH) Interventions     Readmission Risk Interventions Readmission Risk Prevention Plan 04/04/2020 11/22/2019  Transportation Screening Complete Complete  PCP or Specialist Appt within 3-5 Days - Complete  HRI or Home Care Consult - Complete  Social Work Consult for Recovery Care Planning/Counseling - Complete  Palliative Care Screening - Not Applicable  Medication Review Ocean[employer]) Complete  Complete  PCP or Specialist appointment within 3-5 days of discharge Not Complete -  PCP/Specialist Appt Not Complete comments Not ready for dc -  HRI or Home Care Consult Not Complete -  HRI or Home Care Consult Pt Refusal Comments Not needed -  SW Recovery Care/Counseling Consult Complete -  Palliative Care Screening Not Applicable -  Skilled Nursing Facility Not Applicable -  Some recent data might be hidden

## 2020-05-16 NOTE — Plan of Care (Signed)
Poc discussed with pt 

## 2020-05-16 NOTE — Progress Notes (Signed)
Pt had discharge order to go home but requires oxygen continuous. Pt is aware. Advised pt to have someone bring his home oxygen or go by the company he gets his oxygen from and have them bring him a tank. Pt stated he does not have anyone to do that and cannot get anyone to bring his tank here. Notified Quentin Mulling, fnp of this. Also notified Valentina Gu and Joni Reining in case management. They stated ptar could be set up for pt to be transported home today with oxygen but pt refused. They also explained he could stay over night and have oxygen delivered here tomorrow. Pt refused that as well. Quentin Mulling, [name] went in several times and talked to pt regarding the risks of him leaving without any oxygen. I also explained the risks to pt. Pt stated he understood but he has to leave because he has a funeral to go to tomorrow. He is also aware he is supposed to quarantine due to positive covid. I spoke with Cristy, RN the charge nurse on the floor who does not know of any other way we can provide the pt with oxygen. Valentina Gu talked to the [facility] and we are unable to give pt a tank. Pt voices understanding and voices understanding of risks of leaving. Pt signed out AMA

## 2020-05-17 ENCOUNTER — Other Ambulatory Visit: Payer: Self-pay | Admitting: Family Medicine

## 2020-05-17 ENCOUNTER — Telehealth: Payer: Self-pay | Admitting: Family Medicine

## 2020-05-17 DIAGNOSIS — D571 Sickle-cell disease without crisis: Secondary | ICD-10-CM

## 2020-05-17 DIAGNOSIS — G8929 Other chronic pain: Secondary | ICD-10-CM

## 2020-05-17 MED ORDER — OXYCODONE-ACETAMINOPHEN 10-325 MG PO TABS: 1.0000 | ORAL_TABLET | ORAL | 0 refills | Status: DC | PRN

## 2020-05-17 MED ORDER — HYDROXYUREA 500 MG PO CAPS: 2000.0000 mg | ORAL_CAPSULE | Freq: Every day | ORAL | 11 refills | Status: DC

## 2020-05-17 NOTE — Telephone Encounter (Signed)
Done

## 2020-05-28 ENCOUNTER — Ambulatory Visit: Payer: Medicaid Other | Admitting: Family Medicine

## 2020-05-29 ENCOUNTER — Telehealth: Payer: Self-pay | Admitting: Family Medicine

## 2020-05-29 ENCOUNTER — Other Ambulatory Visit: Payer: Self-pay | Admitting: Family Medicine

## 2020-05-29 DIAGNOSIS — G8929 Other chronic pain: Secondary | ICD-10-CM

## 2020-05-29 DIAGNOSIS — D571 Sickle-cell disease without crisis: Secondary | ICD-10-CM

## 2020-05-29 MED ORDER — OXYCODONE-ACETAMINOPHEN 10-325 MG PO TABS: 1.0000 | ORAL_TABLET | ORAL | 0 refills | Status: DC | PRN

## 2020-05-29 NOTE — Telephone Encounter (Signed)
Done

## 2020-05-31 ENCOUNTER — Ambulatory Visit (INDEPENDENT_AMBULATORY_CARE_PROVIDER_SITE_OTHER): Payer: Self-pay | Admitting: Family Medicine

## 2020-05-31 ENCOUNTER — Encounter: Payer: Self-pay | Admitting: Family Medicine

## 2020-05-31 ENCOUNTER — Other Ambulatory Visit: Payer: Self-pay | Admitting: Family Medicine

## 2020-05-31 ENCOUNTER — Other Ambulatory Visit: Payer: Self-pay

## 2020-05-31 VITALS — BP 98/61 | HR 98 | Temp 98.4°F | Ht 75.0 in | Wt 145.0 lb

## 2020-05-31 DIAGNOSIS — G8929 Other chronic pain: Secondary | ICD-10-CM

## 2020-05-31 DIAGNOSIS — F419 Anxiety disorder, unspecified: Secondary | ICD-10-CM

## 2020-05-31 DIAGNOSIS — F1190 Opioid use, unspecified, uncomplicated: Secondary | ICD-10-CM

## 2020-05-31 DIAGNOSIS — E559 Vitamin D deficiency, unspecified: Secondary | ICD-10-CM

## 2020-05-31 DIAGNOSIS — D571 Sickle-cell disease without crisis: Secondary | ICD-10-CM

## 2020-05-31 DIAGNOSIS — Z09 Encounter for follow-up examination after completed treatment for conditions other than malignant neoplasm: Secondary | ICD-10-CM

## 2020-05-31 MED ORDER — OXYCODONE-ACETAMINOPHEN 10-325 MG PO TABS: 1.0000 | ORAL_TABLET | ORAL | 0 refills | Status: DC | PRN

## 2020-05-31 MED FILL — OXYCODONE-APAP 10-325: 10-325 | 15 days supply | Qty: 90 | Fill #0

## 2020-05-31 NOTE — Progress Notes (Signed)
[facility]   Hospital Follow Up  Subjective:  Patient ID: Martin [name], male    DOB: Apr 20, 1991  Age: 30 y.o. MRN: [id number]  CC:  Chief Complaint  Patient presents with  . Follow-up    HPI Martin [name] is a 30 year old male who presents for Hospital Follow today.   Patient Active Problem List   Diagnosis Date Noted  . COVID-19 05/13/2020  . Pneumonia, viral 04/04/2020  . Acute and chronic respiratory failure with hypoxia (HCC) 04/03/2020  . 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/18/2020  . 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 Itching of male genitalia 02/22/2020  . Abnormal penile discharge, without blood 02/22/2020  . Leukocytosis 01/02/2020  . Anxiety 11/27/2019  . Proteinuria 11/27/2019  . Scleral icterus   . Chronic, continuous use of opioids 08/15/2019  . Seasonal allergies 08/15/2019  . Hypoxia   . Chest congestion   . Sickle cell anemia with crisis (HCC) 08/04/2019  . Other chronic pain 08/04/2019  . History of pulmonary embolus (PE) 08/04/2019  . Acute pulmonary embolism (HCC) 02/10/2019  . Acute chest syndrome due to sickle cell crisis (HCC) 02/10/2019  . Sickle cell crisis (HCC) 01/17/2019  . Heart murmur 02/03/2017  . Sickle cell disease with crisis (HCC) 02/02/2017  . 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 Sickle cell pain crisis (HCC) 02/02/2017  . 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HCC) 12/20/2016  . Vitamin D deficiency 08/14/2016  . High risk medication use 09/27/2014  . Generalized anxiety disorder 05/19/2014  . GERD (gastroesophageal reflux disease) 05/19/2014  . HIV (human immunodeficiency virus infection) (HCC) 05/23/2012   Current Status: Since his last office visit, he is doing well with no complaints. He states that he has chronic pain in his lower back and hips. He rates his pain today at 4-5/10. He has not had a hospital visit for Sickle Cell Crisis since 05/22/2020  where he was treated and  discharged the same day. [facility]. He is currently taking all medications as prescribed and staying well hydrated. He reports occasional nausea, constipation, dizziness and headaches. He last took pain medications a few hours ago. He denies fevers, chills, fatigue, recent infections, weight loss, and night sweats. He has not had any visual changes,and falls. No chest pain, heart palpitations, cough and shortness of breath reported. Denies GI problems such as vomiting, and diarrhea. He has no reports of blood in stools, dysuria and hematuria. No depression or anxiety reported today.  He is takiang all medications as prescribed.   Past Medical History:  Diagnosis Date  . Anxiety   . HIV (human immunodeficiency virus infection) (HCC)   . Proteinuria   . Sickle cell crisis (HCC)   . 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HCC)   . Vitamin D deficiency 10/2018    Past Surgical History:  Procedure Laterality Date  . IR IMAGING GUIDED PORT INSERTION  08/29/2019    Family History  Problem Relation Age of Onset  . Sickle cell trait Mother   . Sickle cell trait Father   . Birth defects Maternal Grandmother   . Birth defects Paternal Grandmother     Social History   Socioeconomic History  . Marital status: Single    Spouse name: Not on file  . Number of children: Not on file  . Years of education: Not on file  . Highest education level: Not on file  Occupational History  .  Not on file  Tobacco Use  . Smoking status: Never Smoker  . Smokeless tobacco: Never Used  Vaping Use  . Vaping Use: Some days  . Substances: THC  Substance and Sexual Activity  . Alcohol use: No  . Drug use: Yes    Types: Marijuana  . Sexual activity: Yes    Birth control/protection: Condom  Other Topics Concern  . Not on file  Social History Narrative  . Not on file   Social Determinants of Health   Financial Resource Strain: Not on file  Food Insecurity: Not on file   Transportation Needs: Not on file  Physical Activity: Not on file  Stress: Not on file  Social Connections: Not on file  Intimate Partner Violence: Not on file    Outpatient Medications Prior to Visit  Medication Sig Dispense Refill  . abacavir-dolutegravir-lamiVUDine (TRIUMEQ) 600-50-300 MG tablet Take 1 tablet by mouth at bedtime.     Marland Kitchen apixaban (ELIQUIS) 5 MG TABS tablet Take 1 tablet (5 mg total) by mouth 2 (two) times daily. 60 tablet 5  . hydroxyurea (HYDREA) 500 MG capsule Take 4 capsules (2,000 mg total) by mouth at bedtime. 120 capsule 11  . Multiple Vitamin (MULTIVITAMIN WITH MINERALS) TABS tablet Take 1 tablet by mouth daily with breakfast.     . traZODone (DESYREL) 100 MG tablet Take 1 tablet (100 mg total) by mouth at bedtime as needed for sleep. 90 tablet 3  . Vitamin D, Ergocalciferol, (DRISDOL) 1.25 MG (50000 UT) CAPS capsule Take 1 capsule (50,000 Units total) by mouth every 7 (seven) days. 5 capsule 3  . oxyCODONE-acetaminophen (PERCOCET) 10-325 MG tablet Take 1 tablet by mouth every 4 (four) hours as needed for up to 15 days for pain. 90 tablet 0   No facility-administered medications prior to visit.    Allergies  Allergen Reactions  . Tape Rash and Other (See Comments)    PLEASE DO NOT USE THE CLEAR, THICK, "PLASTIC" TAPE- only paper tape is tolerated   . Ketorolac Swelling and Other (See Comments)    Patient reports facial edema and left arm edema after administration.    ROS Review of Systems  Constitutional: Negative.   HENT: Negative.   Eyes: Negative.   Respiratory: Positive for shortness of breath (occasional ).   Cardiovascular: Negative.   Gastrointestinal: Positive for constipation (occasional ) and nausea (occasional ).  Endocrine: Negative.   Genitourinary: Negative.   Musculoskeletal: Positive for arthralgias (generalized joint pain).  Skin: Negative.   Allergic/Immunologic: Negative.   Neurological: Positive for dizziness (occasional) and  headaches (occasional ).  Hematological: Negative.   Psychiatric/Behavioral: Negative.       Objective:    Physical Exam Vitals and nursing note reviewed.  Constitutional:      Appearance: Normal appearance.  HENT:     Head: Normocephalic and atraumatic.     Nose: Nose normal.     Mouth/Throat:     Mouth: Mucous membranes are moist.     Pharynx: Oropharynx is clear.  Cardiovascular:     Rate and Rhythm: Normal rate and regular rhythm.     Pulses: Normal pulses.     Heart sounds: Normal heart sounds.  Pulmonary:     Effort: Pulmonary effort is normal.     Breath sounds: Normal breath sounds.  Abdominal:     General: Abdomen is flat. Bowel sounds are normal.     Palpations: Abdomen is soft.  Musculoskeletal:        General: Normal  range of motion.     Cervical back: Normal range of motion and neck supple.     Right lower leg: Edema present.  Skin:    General: Skin is warm and dry.  Neurological:     General: No focal deficit present.     Mental Status: He is alert and oriented to person, place, and time.  Psychiatric:        Mood and Affect: Mood normal.        Behavior: Behavior normal.        Thought Content: Thought content normal.        Judgment: Judgment normal.     BP 98/61 (BP Location: Left Arm, Patient Position: Sitting, Cuff Size: Normal)   Pulse 98   Temp 98.4 F (36.9 C) (Temporal)   Ht 6\' 3"  (1.905 m)   Wt 145 lb (65.8 kg)   SpO2 94%   BMI 18.12 kg/m  Wt Readings from Last 3 Encounters:  05/31/20 145 lb (65.8 kg)  05/13/20 150 lb (68 kg)  04/29/20 150 lb (68 kg)     Health Maintenance Due  Topic Date Due  . COVID-19 Vaccine (3 - Inadvertent risk 4-dose series) 11/14/2019    There are no preventive care reminders to display for this patient.  No results found for: TSH Lab Results  Component Value Date   WBC 10.8 (H) 05/16/2020   HGB 7.3 (L) 05/16/2020   HCT 19.1 (L) 05/16/2020   MCV 94.1 05/16/2020   PLT 332 05/16/2020   Lab  Results  Component Value Date   NA 138 05/16/2020   K 4.1 05/16/2020   CO2 28 05/16/2020   GLUCOSE 121 (H) 05/16/2020   BUN 11 05/16/2020   CREATININE 0.82 05/16/2020   BILITOT 3.3 (H) 05/16/2020   ALKPHOS 50 05/16/2020   AST 50 (H) 05/16/2020   ALT 26 05/16/2020   PROT 8.6 (H) 05/16/2020   ALBUMIN 3.6 05/16/2020   CALCIUM 8.7 (L) 05/16/2020   ANIONGAP 9 05/16/2020   No results found for: CHOL No results found for: HDL No results found for: LDLCALC No results found for: [name] No results found for: CHOLHDL Lab Results  Component Value Date   HGBA1C (L) 05/22/2009    <4.0 (NOTE) The ADA recommends the following therapeutic goal for glycemic control related to Hgb A1c measurement: Goal of therapy: <6.5 Hgb A1c  Reference: American Diabetes Association: Clinical Practice Recommendations 2010, Diabetes Care, 2010, 33: (Suppl  1).      Assessment & Plan:   1. Hb-SS disease without crisis [facility]) He is doing well today r/t his chronic pain management. He will continue to take pain medications as prescribed; will continue to avoid extreme heat and cold; will continue to eat a healthy diet and drink at least 64 ounces of water daily; continue stool softener as needed; will avoid colds and flu; will continue to get plenty of sleep and rest; will continue to avoid high stressful situations and remain infection free; will continue Folic Acid 1 mg daily to avoid sickle cell crisis. Continue to follow up with Hematologist as needed.  - oxyCODONE-acetaminophen (PERCOCET) 10-325 MG tablet; Take 1 tablet by mouth every 4 (four) hours as needed for up to 15 days for pain.  Dispense: 90 tablet; Refill: 0  2. Chronic, continuous use of opioids  3. Other chronic pain - oxyCODONE-acetaminophen (PERCOCET) 10-325 MG tablet; Take 1 tablet by mouth e very 4 (four) hours as needed for up to 15 days  for pain.  Dispense: 90 tablet; Refill: 0  4. Anxiety  5. Vitamin D deficiency  6. Follow up He will  follow up in 2 months.   Meds ordered this encounter  Medications  . oxyCODONE-acetaminophen (PERCOCET) 10-325 MG tablet    Sig: Take 1 tablet by mouth every 4 (four) hours as needed for up to 15 days for pain.    Dispense:  90 tablet    Refill:  0    Please do not refill this medication prior to 05/31/2020. Thank you.    No orders of the defined types were placed in this encounter.   Referral Orders  No referral(s) requested today   [name], [name], ANE, [name]-BC [facility] [address] [place], [place] [address] [phone] fax  Problem List Items Addressed This Visit      Other   Anxiety   Chronic, continuous use of opioids   Other chronic pain   Relevant Medications   oxyCODONE-acetaminophen (PERCOCET) 10-325 MG tablet   Vitamin D deficiency    Other Visit Diagnoses    Hb-SS disease without crisis (HCC)    -  Primary   Relevant Medications   oxyCODONE-acetaminophen (PERCOCET) 10-325 MG tablet   Follow up          Meds ordered this encounter  Medications  . oxyCODONE-acetaminophen (PERCOCET) 10-325 MG tablet    Sig: Take 1 tablet by mouth every 4 (four) hours as needed for up to 15 days for pain.    Dispense:  90 tablet    Refill:  0    Please do not refill this medication prior to 05/31/2020. Thank you.    Follow-up: No follow-ups on file.    [name], [name]

## 2020-06-04 ENCOUNTER — Ambulatory Visit: Payer: Medicaid Other | Admitting: Family Medicine

## 2020-06-05 ENCOUNTER — Telehealth: Payer: Self-pay

## 2020-06-05 NOTE — Telephone Encounter (Signed)
Received PA for oxycodone 10-325mg   06/04/20-12/01/20 . Copy has been scan in patient chart.

## 2020-06-10 ENCOUNTER — Telehealth (HOSPITAL_COMMUNITY): Payer: Self-pay

## 2020-06-10 ENCOUNTER — Emergency Department (HOSPITAL_COMMUNITY)
Admission: EM | Admit: 2020-06-10 | Discharge: 2020-06-10 | Disposition: A | Discharge status: Home / Self Care | Payer: Medicaid Other | Attending: Emergency Medicine | Admitting: Emergency Medicine

## 2020-06-10 DIAGNOSIS — D5700 Hb-SS disease with crisis, unspecified: Secondary | ICD-10-CM | POA: Diagnosis not present

## 2020-06-10 DIAGNOSIS — Z7901 Long term (current) use of anticoagulants: Secondary | ICD-10-CM | POA: Insufficient documentation

## 2020-06-10 DIAGNOSIS — Z21 Asymptomatic human immunodeficiency virus [HIV] infection status: Secondary | ICD-10-CM | POA: Diagnosis not present

## 2020-06-10 LAB — RETICULOCYTES
Immature Retic Fract: 61.7 % — ABNORMAL HIGH (ref 2.3–15.9)
RBC.: 2 MIL/uL — ABNORMAL LOW (ref 4.22–5.81)
Retic Count, Absolute: 39 10*3/uL (ref 19.0–186.0)
Retic Ct Pct: 2 % (ref 0.4–3.1)

## 2020-06-10 LAB — BASIC METABOLIC PANEL
Anion gap: 5 (ref 5–15)
BUN: 5 mg/dL — ABNORMAL LOW (ref 6–20)
CO2: 26 mmol/L (ref 22–32)
Calcium: 8.6 mg/dL — ABNORMAL LOW (ref 8.9–10.3)
Chloride: 110 mmol/L (ref 98–111)
Creatinine, Ser: 0.59 mg/dL — ABNORMAL LOW (ref 0.61–1.24)
GFR, Estimated: >60 mL/min (ref >60)
Glucose, Bld: 97 mg/dL (ref 70–99)
Potassium: 3.6 mmol/L (ref 3.5–5.1)
Sodium: 141 mmol/L (ref 135–145)

## 2020-06-10 LAB — CBC WITH DIFFERENTIAL/PLATELET
Abs Immature Granulocytes: 0.03 10*3/uL (ref 0.00–0.07)
Basophils Absolute: 0.1 10*3/uL (ref 0.0–0.1)
Basophils Relative: 0 %
Eosinophils Absolute: 0.2 10*3/uL (ref 0.0–0.5)
Eosinophils Relative: 2 %
HCT: 20.5 % — ABNORMAL LOW (ref 39.0–52.0)
Hemoglobin: 7.2 g/dL — ABNORMAL LOW (ref 13.0–17.0)
Immature Granulocytes: 0 %
Lymphocytes Relative: 64 %
Lymphs Abs: 7.7 10*3/uL — ABNORMAL HIGH (ref 0.7–4.0)
MCH: 37.9 pg — ABNORMAL HIGH (ref 26.0–34.0)
MCHC: 35.1 g/dL (ref 30.0–36.0)
MCV: 107.9 fL — ABNORMAL HIGH (ref 80.0–100.0)
Monocytes Absolute: 1.2 10*3/uL — ABNORMAL HIGH (ref 0.1–1.0)
Monocytes Relative: 10 %
Neutro Abs: 2.9 10*3/uL (ref 1.7–7.7)
Neutrophils Relative %: 24 %
Platelets: 245 10*3/uL (ref 150–400)
RBC: 1.9 MIL/uL — ABNORMAL LOW (ref 4.22–5.81)
RDW: 24.3 % — ABNORMAL HIGH (ref 11.5–15.5)
WBC: 12.1 10*3/uL — ABNORMAL HIGH (ref 4.0–10.5)
nRBC: 26.8 % — ABNORMAL HIGH (ref 0.0–0.2)

## 2020-06-10 MED ORDER — ONDANSETRON HCL 4 MG/2ML IJ SOLN
4.0000 mg | INTRAMUSCULAR | Status: DC | PRN
  Filled 2020-06-10: qty 2

## 2020-06-10 MED ORDER — SODIUM CHLORIDE 0.45 % IV SOLN: INTRAVENOUS | Status: DC

## 2020-06-10 MED ORDER — HYDROMORPHONE HCL 2 MG/ML IJ SOLN: 2.0000 mg | INTRAMUSCULAR | Status: DC

## 2020-06-10 MED ORDER — OXYCODONE HCL 5 MG PO TABS
15.0000 mg | ORAL_TABLET | Freq: Once | ORAL | Status: AC
  Administered 2020-06-10: 15 mg via ORAL
  Filled 2020-06-10: qty 3

## 2020-06-10 MED ORDER — DIPHENHYDRAMINE HCL 50 MG/ML IJ SOLN
25.0000 mg | Freq: Once | INTRAMUSCULAR | Status: AC
  Administered 2020-06-10: 25 mg via INTRAVENOUS
  Filled 2020-06-10: qty 1

## 2020-06-10 MED ORDER — HYDROMORPHONE HCL 2 MG/ML IJ SOLN
2.0000 mg | Freq: Once | INTRAMUSCULAR | Status: AC
  Administered 2020-06-10: 2 mg via INTRAVENOUS
  Filled 2020-06-10: qty 1

## 2020-06-10 MED ORDER — HEPARIN SOD (PORK) LOCK FLUSH 100 UNIT/ML IV SOLN
500.0000 [IU] | Freq: Once | INTRAVENOUS | Status: AC
  Administered 2020-06-10: 500 [IU]
  Filled 2020-06-10: qty 5

## 2020-06-10 MED ORDER — HYDROMORPHONE HCL 2 MG/ML IJ SOLN
2.0000 mg | INTRAMUSCULAR | Status: AC
  Administered 2020-06-10: 2 mg via INTRAVENOUS
  Filled 2020-06-10: qty 1

## 2020-06-10 NOTE — Telephone Encounter (Signed)
Patient called in. Complains of pain and wants to come in for treatment.Pt states he was just discharged from ED. Pt informed that day hospital is at capacity and can call clinic Wednesday for treatment if still not feeling well. Pt verbalized understanding.

## 2020-06-10 NOTE — Discharge Instructions (Addendum)
1.  Go to sickle cell clinic first thing in the morning for evaluation and treatment. 2.  Continue your regularly prescribed medications.

## 2020-06-10 NOTE — ED Provider Notes (Signed)
Anticipated plan was patient will be discharged to go directly to sickle cell outpatient clinic.  He had been treated for pain in the emergency department but had residual pain. Physical Exam  BP 112/80   Pulse 72   Temp 98 F (36.7 C) (Oral)   Resp 16   Ht 6\' 3"  (1.905 m)   Wt 65.8 kg   SpO2 97%   BMI 18.12 kg/m   Physical Exam  ED Course/Procedures     Procedures  MDM  Consult: Reviewed with Dr. .  He advises sickle cell clinic is full at this time.  Advises to give the patient an additional dose of Dilaudid and his pain medications.  Patient then can be discharged and present to the sickle cell outpatient clinic tomorrow morning.  Plan reviewed with patient.  He is agreeable to this plan.       Hyman Hopes, MD 06/10/20 [phone]

## 2020-06-10 NOTE — ED Triage Notes (Signed)
Pt came in with c/o SCC that started tonight. Pt states he took his home Percocet with no relief. He is hurting all over in his joints. Denies chest pain. Pt 91% on RA

## 2020-06-10 NOTE — ED Provider Notes (Signed)
[facility]-EMERGENCY DEPT Provider Note: Lowella Dell, MD, FACEP  CSN: [id number] MRN: [id number] ARRIVAL: 06/10/20 at 0124 ROOM: WA19/WA19   CHIEF COMPLAINT  Sickle Cell Pain Crisis   HISTORY OF PRESENT ILLNESS  06/10/20 3:21 AM Martin [name] is a 30 y.o. male with sickle cell disease and HIV.  He is here with sickle cell pain that began yesterday evening.  He describes the pain is diffuse and located in his joints.  It is not significantly worse with movement.  He rates his pain is a 9 out of 10, not relieved with his home Percocet.  He is not having chest pain, shortness of breath or fever.   Past Medical History:  Diagnosis Date  . Anxiety   . HIV (human immunodeficiency virus infection) (HCC)   . Proteinuria   . Sickle cell crisis (HCC)   . 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HCC)   . Vitamin D deficiency 10/2018    Past Surgical History:  Procedure Laterality Date  . IR IMAGING GUIDED PORT INSERTION  08/29/2019    Family History  Problem Relation Age of Onset  . Sickle cell trait Mother   . Sickle cell trait Father   . Birth defects Maternal Grandmother   . Birth defects Paternal Grandmother     Social History   Tobacco Use  . Smoking status: Never Smoker  . Smokeless tobacco: Never Used  Vaping Use  . Vaping Use: Some days  . Substances: THC  Substance Use Topics  . Alcohol use: No  . Drug use: Yes    Types: Marijuana    Prior to Admission medications   Medication Sig Start Date End Date Taking? Authorizing Provider  abacavir-dolutegravir-lamiVUDine (TRIUMEQ) 600-50-300 MG tablet Take 1 tablet by mouth at bedtime.    Yes [provider]  apixaban (ELIQUIS) 5 MG TABS tablet Take 1 tablet (5 mg total) by mouth 2 (two) times daily. 08/21/19  Yes Massie Maroon, [name]  hydroxyurea (HYDREA) 500 MG capsule Take 4 capsules (2,000 mg total) by mouth at bedtime. 05/17/20  Yes [name], [name]  Multiple Vitamin (MULTIVITAMIN WITH MINERALS) TABS tablet Take 1  tablet by mouth daily with breakfast.    Yes [provider]  oxyCODONE-acetaminophen (PERCOCET) 10-325 MG tablet Take 1 tablet by mouth every 4 (four) hours as needed for up to 15 days for pain. 05/31/20 06/15/20 Yes [name], [name]  traZODone (DESYREL) 100 MG tablet Take 1 tablet (100 mg total) by mouth at bedtime as needed for sleep. 03/26/20  Yes [name], [name]  Vitamin D, Ergocalciferol, (DRISDOL) 1.25 MG (50000 UT) CAPS capsule Take 1 capsule (50,000 Units total) by mouth every 7 (seven) days. 11/01/18  Yes [name], [name]    Allergies Tape and Ketorolac   REVIEW OF SYSTEMS  Negative except as noted here or in the History of Present Illness.   PHYSICAL EXAMINATION  Initial Vital Signs Blood pressure 102/70, pulse 69, temperature 98 F (36.7 C), temperature source Oral, resp. rate 18, height 6\' 3"  (1.905 m), weight 65.8 kg, SpO2 95 %.  Examination General: Well-developed, thin male in no acute distress; appearance consistent with age of record HENT: normocephalic; atraumatic Eyes: pupils equal, round and reactive to light; extraocular muscles intact; no scleral icterus Neck: supple Heart: regular rate and rhythm Lungs: clear to auscultation bilaterally Chest: Port-A-Cath right upper quadrant Abdomen: soft; nondistended; nontender; bowel sounds present Extremities: No deformity; full range of motion; pulses normal Neurologic: Awake, alert and  oriented; motor function intact in all extremities and symmetric; no facial droop Skin: Warm and dry Psychiatric: Flat affect   RESULTS  Summary of this visit's results, reviewed and interpreted by myself:   EKG Interpretation  Date/Time:    Ventricular Rate:    PR Interval:    QRS Duration:   QT Interval:    QTC Calculation:   R Axis:     Text Interpretation:        Laboratory Studies: Results for orders placed or performed during the hospital encounter of 06/10/20 (from the past 24 hour(s))   Basic metabolic panel     Status: Abnormal   Collection Time: 06/10/20  3:21 AM  Result Value Ref Range   Sodium 141 135 - 145 mmol/L   Potassium 3.6 3.5 - 5.1 mmol/L   Chloride 110 98 - 111 mmol/L   CO2 26 22 - 32 mmol/L   Glucose, Bld 97 70 - 99 mg/dL   BUN 5 (L) 6 - 20 mg/dL   Creatinine, Ser 2.94 (L) 0.61 - 1.24 mg/dL   Calcium 8.6 (L) 8.9 - 10.3 mg/dL   GFR, Estimated >76 >54 mL/min   Anion gap 5 5 - 15  CBC WITH DIFFERENTIAL     Status: Abnormal   Collection Time: 06/10/20  3:21 AM  Result Value Ref Range   WBC 12.1 (H) 4.0 - 10.5 K/uL   RBC 1.90 (L) 4.22 - 5.81 MIL/uL   Hemoglobin 7.2 (L) 13.0 - 17.0 g/dL   HCT 65.0 (L) 35.4 - 65.6 %   MCV 107.9 (H) 80.0 - 100.0 fL   MCH 37.9 (H) 26.0 - 34.0 pg   MCHC 35.1 30.0 - 36.0 g/dL   RDW 81.2 (H) 75.1 - 70.0 %   Platelets 245 150 - 400 K/uL   nRBC 26.8 (H) 0.0 - 0.2 %   Neutrophils Relative % 24 %   Neutro Abs 2.9 1.7 - 7.7 K/uL   Lymphocytes Relative 64 %   Lymphs Abs 7.7 (H) 0.7 - 4.0 K/uL   Monocytes Relative 10 %   Monocytes Absolute 1.2 (H) 0.1 - 1.0 K/uL   Eosinophils Relative 2 %   Eosinophils Absolute 0.2 0.0 - 0.5 K/uL   Basophils Relative 0 %   Basophils Absolute 0.1 0.0 - 0.1 K/uL   WBC Morphology ABSOLUTE LYMPHOCYTOSIS    Immature Granulocytes 0 %   Abs Immature Granulocytes 0.03 0.00 - 0.07 K/uL   Agglutination PRESENT    Polychromasia PRESENT    Sickle Cells MARKED    Target Cells PRESENT   Reticulocytes     Status: Abnormal   Collection Time: 06/10/20  3:21 AM  Result Value Ref Range   Retic Ct Pct 2.0 0.4 - 3.1 %   RBC. 2.00 (L) 4.22 - 5.81 MIL/uL   Retic Count, Absolute 39.0 19.0 - 186.0 K/uL   Immature Retic Fract 61.7 (H) 2.3 - 15.9 %   Imaging Studies: No results found.  ED COURSE and MDM  Nursing notes, initial and subsequent vitals signs, including pulse oximetry, reviewed and interpreted by myself.  Vitals:   06/10/20 0315 06/10/20 0345 06/10/20 0445 06/10/20 0515  BP: 102/70 107/74  106/62 102/73  Pulse: 69 71 78 77  Resp: 18 14 16 15   Temp:      TempSrc:      SpO2: 95% 92% 96% 93%  Weight:      Height:       Medications  0.45 % sodium chloride infusion (  Intravenous New Bag/Given 06/10/20 0357)  ondansetron (ZOFRAN) injection 4 mg (has no administration in time range)  HYDROmorphone (DILAUDID) injection 2 mg (2 mg Intravenous Given 06/10/20 0459)  oxyCODONE (Oxy IR/ROXICODONE) immediate release tablet 15 mg (15 mg Oral Given 06/10/20 0521)  diphenhydrAMINE (BENADRYL) injection 25 mg (25 mg Intravenous Given 06/10/20 0353)  HYDROmorphone (DILAUDID) injection 2 mg (2 mg Intravenous Given 06/10/20 0356)   5:51 AM Patient's pain is improved but not back to baseline after 2 doses of Dilaudid.  We will give him his third dose of (oral) narcotic and he would like to be transferred to the sickle cell clinic when it opens at 8 AM.   PROCEDURES  Procedures   ED DIAGNOSES     ICD-10-CM   1. Sickle cell pain crisis (HCC)  D57.00        Johanny Segers, Jonny Ruiz, MD 06/10/20 [phone]

## 2020-06-11 ENCOUNTER — Telehealth: Payer: Self-pay

## 2020-06-11 NOTE — Telephone Encounter (Signed)
Transition Care Management Unsuccessful Follow-up Telephone Call  Date of discharge and from where:  06/10/2020 from [facility]  Attempts:  1st Attempt  Reason for unsuccessful TCM follow-up call:  Unable to leave message

## 2020-06-12 NOTE — Telephone Encounter (Signed)
Transition Care Management Unsuccessful Follow-up Telephone Call  Date of discharge and from where:  06/10/2020 from [facility]  Attempts:  2nd Attempt  Reason for unsuccessful TCM follow-up call:  Unable to leave message

## 2020-06-13 ENCOUNTER — Telehealth: Payer: Self-pay | Admitting: Family Medicine

## 2020-06-13 NOTE — Telephone Encounter (Signed)
Pt called req refills: Hydroxyurea & percocet

## 2020-06-13 NOTE — Telephone Encounter (Signed)
Transition Care Management Follow-up Telephone Call  Date of discharge and from where: 06/10/2020 from [facility]  How have you been since you were released from the hospital? Pt states that he is feeling much better.   Any questions or concerns? No  Items Reviewed:  Did the pt receive and understand the discharge instructions provided? Yes   Medications obtained and verified? Yes   Other? No   Any new allergies since your discharge? No   Dietary orders reviewed? N/A  Do you have support at home? Yes    Functional Questionnaire: (I = Independent and D = Dependent) ADLs: I  Bathing/Dressing- I  Meal Prep- I  Eating- I  Maintaining continence- I  Transferring/Ambulation- I  Managing Meds- I   Follow up appointments reviewed:   PCP Hospital f/u appt confirmed? No  Pt needs assistance with a sooner appointment.   Are transportation arrangements needed? No  If their condition worsens, is the pt aware to call PCP or go to the Emergency Dept.? Yes Was the patient provided with contact information for the PCP's office or ED? Yes Was to pt encouraged to call back with questions or concerns? Yes

## 2020-06-14 ENCOUNTER — Other Ambulatory Visit: Payer: Self-pay | Admitting: Family Medicine

## 2020-06-14 DIAGNOSIS — G8929 Other chronic pain: Secondary | ICD-10-CM

## 2020-06-14 DIAGNOSIS — D571 Sickle-cell disease without crisis: Secondary | ICD-10-CM

## 2020-06-14 MED ORDER — OXYCODONE-ACETAMINOPHEN 10-325 MG PO TABS: 1.0000 | ORAL_TABLET | ORAL | 0 refills | Status: DC | PRN

## 2020-06-26 ENCOUNTER — Telehealth: Payer: Self-pay

## 2020-06-26 ENCOUNTER — Other Ambulatory Visit: Payer: Self-pay | Admitting: Family Medicine

## 2020-06-26 DIAGNOSIS — G8929 Other chronic pain: Secondary | ICD-10-CM

## 2020-06-26 DIAGNOSIS — D571 Sickle-cell disease without crisis: Secondary | ICD-10-CM

## 2020-06-26 MED ORDER — OXYCODONE-ACETAMINOPHEN 10-325 MG PO TABS: 1.0000 | ORAL_TABLET | ORAL | 0 refills | Status: DC | PRN

## 2020-06-26 NOTE — Telephone Encounter (Signed)
Med refill   Oxycodone 10 mg  

## 2020-07-06 DIAGNOSIS — D5700 Hb-SS disease with crisis, unspecified: Secondary | ICD-10-CM | POA: Insufficient documentation

## 2020-07-06 DIAGNOSIS — M791 Myalgia, unspecified site: Secondary | ICD-10-CM | POA: Insufficient documentation

## 2020-07-06 DIAGNOSIS — B20 Human immunodeficiency virus [HIV] disease: Secondary | ICD-10-CM | POA: Insufficient documentation

## 2020-07-06 MED ORDER — DEXTROSE 5 % FLUSH FOR PUMPS *I*
0.0000 mL/h | INTRAVENOUS | Status: DC | PRN
Start: 2020-07-06 — End: 2020-07-08

## 2020-07-06 MED ORDER — SODIUM CHLORIDE 0.9 % FLUSH FOR PUMPS *I*
0.0000 mL/h | INTRAVENOUS | Status: DC | PRN
Start: 2020-07-06 — End: 2020-07-08

## 2020-07-06 NOTE — ED Triage Notes (Signed)
Pt presenting to the ED with sickle cell pain.  Pt lives in [place] and is visiting.  Pt reports his home pain regimen is percocet 10mg  pills as needed but it is not working. Also reports that he usually wears 2L NC and has not been             Prehospital medications given: No

## 2020-07-06 NOTE — First Provider Contact (Signed)
ED First Provider Contact Note    Initial provider evaluation performed by   ED First Provider Contact     Date/Time Event User Comments    07/06/20 2118 ED First Provider Contact Wilfred Lacy Initial Face to Face Provider Contact          Vital signs reviewed.    Assessment: 30 yo man, hx sickle cell disease, visiting from [place], c/o 2 day hx flare of typical sickle cell pain.  Normally wears 2 liters O2, did not bring it with him.  Takes percocet at home for his pain.  O2 sat 86%-->93% on 3 liters.  No chest pain or dyspnea.    Orders placed:  LABS and O2     Patient requires further evaluation.     Halbert Jesson ANN Jaston Havens, PA, 07/06/2020, 9:18 PM     Hazle Quant, [place]  07/06/20 2120

## 2020-07-07 ENCOUNTER — Emergency Department
Admission: EM | Admit: 2020-07-07 | Discharge: 2020-07-07 | Disposition: A | Discharge status: Home / Self Care | Payer: Self-pay | Source: Ambulatory Visit | Attending: Emergency Medicine | Admitting: Emergency Medicine

## 2020-07-07 ENCOUNTER — Emergency Department: Payer: Self-pay

## 2020-07-07 ENCOUNTER — Encounter: Payer: Self-pay | Admitting: Emergency Medicine

## 2020-07-07 DIAGNOSIS — D5700 Hb-SS disease with crisis, unspecified: Secondary | ICD-10-CM

## 2020-07-07 DIAGNOSIS — R918 Other nonspecific abnormal finding of lung field: Secondary | ICD-10-CM

## 2020-07-07 LAB — CBC AND DIFFERENTIAL
Baso # K/uL: 0.1 10*3/uL (ref 0.0–0.1)
Basophil %: 0.6 %
Eos # K/uL: 0.2 10*3/uL (ref 0.0–0.5)
Eosinophil %: 2.1 %
Hematocrit: 23 % — ABNORMAL LOW (ref 40–51)
Hemoglobin: 8.5 g/dL — ABNORMAL LOW (ref 13.7–17.5)
IMM Granulocytes #: 0 10*3/uL (ref 0.0–0.0)
IMM Granulocytes: 0.4 %
Lymph # K/uL: 3.9 10*3/uL — ABNORMAL HIGH (ref 1.3–3.6)
Lymphocyte %: 43.3 %
MCH: 38 pg — ABNORMAL HIGH (ref 26–32)
MCHC: 37 g/dL (ref 32–37)
MCV: 104 fL — ABNORMAL HIGH (ref 79–92)
Mono # K/uL: 0.9 10*3/uL — ABNORMAL HIGH (ref 0.3–0.8)
Monocyte %: 10 %
Neut # K/uL: 4 10*3/uL (ref 1.8–5.4)
Nucl RBC # K/uL: 3.6 10*3/uL — ABNORMAL HIGH (ref 0.0–0.0)
Nucl RBC %: 40.1 /100 WBC — ABNORMAL HIGH (ref 0.0–0.2)
Platelets: 290 10*3/uL (ref 150–330)
RBC: 2.2 MIL/uL — ABNORMAL LOW (ref 4.6–6.1)
RDW: 24.7 % — ABNORMAL HIGH (ref 11.6–14.4)
Seg Neut %: 43.6 %
WBC: 9.1 10*3/uL (ref 4.2–9.1)

## 2020-07-07 LAB — RETICULOCYTES
Retic %: 20 % — ABNORMAL HIGH (ref 0.7–2.3)
Retic Abs: 447.3 10*3/uL — ABNORMAL HIGH (ref 33.8–124.0)

## 2020-07-07 LAB — PLASMA PROF 7 (ED ONLY)
Anion Gap,PL: 15 (ref 7–16)
CO2,Plasma: 24 mmol/L (ref 20–28)
Chloride,Plasma: 105 mmol/L (ref 96–108)
Creatinine: 0.66 mg/dL — ABNORMAL LOW (ref 0.67–1.17)
Glucose,Plasma: 90 mg/dL (ref 60–99)
Potassium,Plasma: 4 mmol/L (ref 3.3–4.6)
Sodium,Plasma: 144 mmol/L (ref 133–145)
UN,Plasma: 6 mg/dL (ref 6–20)
eGFR BY CREAT: 129 *

## 2020-07-07 LAB — RBC MORPHOLOGY

## 2020-07-07 LAB — HOLD SST

## 2020-07-07 MED ORDER — DIPHENHYDRAMINE HCL 25 MG ORAL SOLID *WRAPPED*
25.0000 mg | Freq: Once | ORAL | Status: AC
Start: 2020-07-07 — End: 2020-07-07
  Administered 2020-07-07: 25 mg via ORAL
  Filled 2020-07-07: qty 1

## 2020-07-07 MED ORDER — HYDROMORPHONE HCL 2 MG/ML IJ SOLN *WRAPPED*
2.0000 mg | Freq: Once | INTRAMUSCULAR | Status: AC
Start: 2020-07-07 — End: 2020-07-07
  Administered 2020-07-07: 2 mg via INTRAVENOUS
  Filled 2020-07-07: qty 1

## 2020-07-07 NOTE — ED Notes (Signed)
WAITING ROOM CHECK    Patient visualized in waiting room. Patient appears in no acute distress. Patient's chart reviewed and most recent vital signs reviewed. Will continue to monitor.

## 2020-07-07 NOTE — Discharge Instructions (Addendum)
Matthew Rush was seen today for sickle cell related pain. You were evaluated and it was determined that there is no need for hospitalization at this time and he/she is stable for discharge home.    Continue taking any medications as prescribed.     If you develop fevers, worsening chest pain, shortness of breath or any other concerning symptoms please contact your primary care doctor or return to the ED.

## 2020-07-07 NOTE — ED Provider Notes (Signed)
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 Pt denies increased SOB (particularly when he wears his oxygen), chest pain, nausea, vomiting, diarrhea, constipation, cough, throat pain, or dysuria. Reports that his home percocets have not been helping with his pain. He states that when he usually comes into the hospital he gets IV dilaudid with benadryl because the dilaudid makes him itch.       History provided by:  Patient  Language interpreter used: No        Medical/Surgical/Family History     No past medical history on file.     There is no problem list on file for this patient.           No past surgical history on file.  No family history on file.       Social History     Tobacco Use    Smoking status: Not on file    Smokeless tobacco: Not on file   Substance Use Topics    Alcohol use: Not on file    Drug use: Not on file     Living Situation     Questions Responses    Patient lives with     Homeless     Caregiver for other family member     External Services     Employment     Domestic Violence Risk                 Review of Systems   Review of Systems   Constitutional: Negative for chills, fatigue and fever.   HENT: Negative for congestion, rhinorrhea and sore throat.    Eyes: Negative for pain and redness.   Respiratory: Negative for cough and shortness of breath.    Cardiovascular: Negative for chest pain and leg swelling.   Gastrointestinal: Negative for abdominal pain, diarrhea, nausea and vomiting.   Genitourinary: Negative for difficulty urinating and dysuria.   Musculoskeletal: Positive for myalgias. Negative for arthralgias and gait problem.   Skin: Negative for color change and rash.   Neurological: Negative for dizziness,  light-headedness, numbness and headaches.   Psychiatric/Behavioral: Negative for behavioral problems and confusion.       Physical Exam     Triage Vitals  Triage Start: Start, (07/06/20 2114)   First Recorded BP: 129/78, Resp: 10, Temp: 36 C (96.8 F), Temp src: TEMPORAL Oxygen Therapy SpO2: 93 %, Oximetry Source: Rt Hand, O2 Device: O2 Therapy, O2 Therapy: Nasal cannula, O2 Flow Rate: 3 L/min, Heart Rate: 82, (07/06/20 2116) Heart Rate (via Pulse Ox): 82, (07/06/20 2116).  First Pain Reported  0-10 Scale: 10, (07/06/20 2116)       Physical Exam  Vitals and nursing note reviewed.   Constitutional:       General: He is not in acute distress.     Appearance: He is well-developed. He is not diaphoretic.   HENT:      Head: Normocephalic and atraumatic.      Right Ear: External ear normal.      Left Ear: External ear normal.      Nose: Nose normal.   Eyes:      Conjunctiva/sclera: Conjunctivae normal.  Cardiovascular:      Rate and Rhythm: Normal rate and regular rhythm.      Heart sounds: Normal heart sounds.   Pulmonary:      Effort: Pulmonary effort is normal.      Breath sounds: Normal breath sounds. No wheezing.   Abdominal:      General: There is no distension.      Palpations: Abdomen is soft.      Tenderness: There is no abdominal tenderness.   Musculoskeletal:         General: No deformity.      Cervical back: Normal range of motion.   Skin:     General: Skin is warm and dry.      Capillary Refill: Capillary refill takes less than 2 seconds.   Neurological:      Mental Status: He is alert and oriented to person, place, and time.      Motor: No abnormal muscle tone.      Coordination: Coordination normal.   Psychiatric:         Behavior: Behavior normal.         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 Denies SOB or chest pain. Has intermittently needed to wear supplemental O2 to 3L while is baseline is apparently 2L. He is in the area visiting from [place].     Differential diagnosis:    Sickle cell pain crisis  Lower suspicion for acute chest syndrome  Anemia  Low cell counts from HIV    Plan:    Orders Placed This Encounter      *Chest standard frontal and lateral views      CBC and differential      Plasma profile 7 [facility] ED only)      Reticulocytes      Hold SST      Lymphocyte subset (T & B cells)      Lymphocyte subset (T & B cells)      Nursing communication      Nasal cannula oxygen      Saline lock IV        Independent review of: Existing labs, XRays, chart/prior records    ED Course and Disposition:  Blood work looks improved compared to his labs from his last admission for acute pain syndrome (imrproved H/h, increased reticulocytes at 20) without dehydration. CXR doesn't look concerning for acute issues. Pt's pain feels improved after one dose of IV dilaudid and oral benadryl. He feels that he might need one more dose and then he will be able to go home. Second dose of dilaudid pending at time of shift change. Will sign out care to evening team.             Lilia Pro, PA          Lilia Pro, [place]  07/07/20 [phone]

## 2020-07-07 NOTE — ED Provider Progress Notes (Addendum)
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 Amenable to being discharged.         Beckey Downing, MD  Resident  07/07/20 [phone]

## 2020-07-08 ENCOUNTER — Emergency Department
Admission: EM | Admit: 2020-07-08 | Discharge: 2020-07-08 | Disposition: A | Discharge status: Home / Self Care | Payer: Self-pay | Source: Ambulatory Visit | Attending: Emergency Medicine | Admitting: Emergency Medicine

## 2020-07-08 ENCOUNTER — Encounter: Payer: Self-pay | Admitting: Emergency Medicine

## 2020-07-08 ENCOUNTER — Emergency Department: Payer: Self-pay | Admitting: Radiology

## 2020-07-08 DIAGNOSIS — R918 Other nonspecific abnormal finding of lung field: Secondary | ICD-10-CM

## 2020-07-08 DIAGNOSIS — M791 Myalgia, unspecified site: Secondary | ICD-10-CM

## 2020-07-08 DIAGNOSIS — R0602 Shortness of breath: Secondary | ICD-10-CM

## 2020-07-08 DIAGNOSIS — D5700 Hb-SS disease with crisis, unspecified: Secondary | ICD-10-CM | POA: Insufficient documentation

## 2020-07-08 DIAGNOSIS — D571 Sickle-cell disease without crisis: Secondary | ICD-10-CM

## 2020-07-08 DIAGNOSIS — Z452 Encounter for adjustment and management of vascular access device: Secondary | ICD-10-CM

## 2020-07-08 DIAGNOSIS — R52 Pain, unspecified: Secondary | ICD-10-CM | POA: Insufficient documentation

## 2020-07-08 LAB — COMPREHENSIVE METABOLIC PANEL
ALT: 18 U/L (ref 0–50)
AST: 44 U/L (ref 0–50)
Albumin: 3.8 g/dL (ref 3.5–5.2)
Alk Phos: 68 U/L (ref 40–130)
Anion Gap: 11 (ref 7–16)
Bilirubin,Total: 6.5 mg/dL — ABNORMAL HIGH (ref 0.0–1.2)
CO2: 24 mmol/L (ref 20–28)
Calcium: 8.7 mg/dL — ABNORMAL LOW (ref 9.0–10.3)
Chloride: 108 mmol/L (ref 96–108)
Creatinine: 0.67 mg/dL (ref 0.67–1.17)
Glucose: 134 mg/dL — ABNORMAL HIGH (ref 60–99)
Lab: 5 mg/dL — ABNORMAL LOW (ref 6–20)
Potassium: 4 mmol/L (ref 3.3–5.1)
Sodium: 143 mmol/L (ref 133–145)
Total Protein: 7.6 g/dL (ref 6.3–7.7)
eGFR BY CREAT: 128 *

## 2020-07-08 LAB — RBC MORPHOLOGY

## 2020-07-08 LAB — LYMPHOCYTE SUBSET (T & B CELLS)
B LYM #(CD19): 419 cells/uL (ref 120–725)
B LYM %(CD19): 19 % (ref 7–36)
CD4#: 624 cells/uL (ref 496–2186)
CD4%: 28 % — ABNORMAL LOW (ref 32–71)
CD4/CD8: 0.7 (ref 0.7–3.0)
CD8#: 869 cells/uL (ref 177–1137)
NK LYM #(CD16+56): 106 cells/uL (ref 37–758)
NK LYM %(CD16+56): 5 % (ref 4–26)
NK LYM# (CD3+16+56+): 100 cells/uL
NK LYM% (CD3+16+56+): 5 %
T LYM #(CD3): 1648 cells/uL (ref 754–2810)
T LYM %(CD3): 74 % (ref 54–87)
T Suppress %(CD8): 38 % (ref 10–38)

## 2020-07-08 LAB — CBC AND DIFFERENTIAL
Baso # K/uL: 0 10*3/uL (ref 0.0–0.1)
Basophil %: 0 %
Eos # K/uL: 0 10*3/uL (ref 0.0–0.5)
Eosinophil %: 0 %
Hematocrit: 22 % — ABNORMAL LOW (ref 40–51)
Hemoglobin: 7.9 g/dL — ABNORMAL LOW (ref 13.7–17.5)
Lymph # K/uL: 5.2 10*3/uL — ABNORMAL HIGH (ref 1.3–3.6)
Lymphocyte %: 55 %
MCH: 37 pg — ABNORMAL HIGH (ref 26–32)
MCHC: 37 g/dL (ref 32–37)
MCV: 102 fL — ABNORMAL HIGH (ref 79–92)
Mono # K/uL: 0.7 10*3/uL (ref 0.3–0.8)
Monocyte %: 7 %
Neut # K/uL: 3.6 10*3/uL (ref 1.8–5.4)
Nucl RBC # K/uL: 2.1 10*3/uL — ABNORMAL HIGH (ref 0.0–0.0)
Nucl RBC %: 22 /100 WBC — ABNORMAL HIGH (ref 0.0–0.2)
Platelets: 246 10*3/uL (ref 150–330)
RBC: 2.1 MIL/uL — ABNORMAL LOW (ref 4.6–6.1)
RDW: 23.5 % — ABNORMAL HIGH (ref 11.6–14.4)
Seg Neut %: 37 %
WBC: 9.4 10*3/uL — ABNORMAL HIGH (ref 4.2–9.1)

## 2020-07-08 LAB — DIFF MANUAL
Bands %: 1 % (ref 0–10)
Diff Based On: 100 CELLS

## 2020-07-08 LAB — HM HIV SCREENING OFFERED

## 2020-07-08 LAB — RETICULOCYTES
Retic %: 18.9 % — ABNORMAL HIGH (ref 0.7–2.3)
Retic Abs: 401.5 10*3/uL — ABNORMAL HIGH (ref 33.8–124.0)

## 2020-07-08 MED ORDER — SODIUM CHLORIDE 0.9 % FLUSH FOR PUMPS *I*
0.0000 mL/h | INTRAVENOUS | Status: DC | PRN
Start: 2020-07-08 — End: 2020-07-09

## 2020-07-08 MED ORDER — HEPARIN LOCK FLUSH 10 UNIT/ML IJ SOLN WRAPPED *I*
50.0000 [IU] | Freq: Three times a day (TID) | INTRAVENOUS | Status: DC | PRN
Start: 2020-07-08 — End: 2020-07-09
  Administered 2020-07-08: 50 [IU]
  Filled 2020-07-08: qty 5

## 2020-07-08 MED ORDER — DEXTROSE 5 % FLUSH FOR PUMPS *I*
0.0000 mL/h | INTRAVENOUS | Status: DC | PRN
Start: 2020-07-08 — End: 2020-07-09

## 2020-07-08 MED ORDER — DIPHENHYDRAMINE HCL 50 MG/ML IJ SOLN *I*
25.0000 mg | Freq: Once | INTRAMUSCULAR | Status: AC | PRN
Start: 2020-07-08 — End: 2020-07-08
  Administered 2020-07-08: 25 mg via INTRAVENOUS
  Filled 2020-07-08: qty 1

## 2020-07-08 MED ORDER — SODIUM CHLORIDE 0.9 % INJ (FLUSH) WRAPPED *I*
10.0000 mL | Status: DC | PRN
Start: 2020-07-08 — End: 2020-07-09

## 2020-07-08 MED ORDER — SODIUM CHLORIDE 0.9 % IV BOLUS *I*
1000.0000 mL | Freq: Once | Status: AC
Start: 2020-07-08 — End: 2020-07-08
  Administered 2020-07-08: 1000 mL via INTRAVENOUS

## 2020-07-08 MED ORDER — ONDANSETRON HCL 2 MG/ML IV SOLN *I*
4.0000 mg | Freq: Once | INTRAMUSCULAR | Status: AC
Start: 2020-07-08 — End: 2020-07-08
  Administered 2020-07-08: 4 mg via INTRAVENOUS
  Filled 2020-07-08: qty 2

## 2020-07-08 MED ORDER — HYDROMORPHONE HCL 2 MG/ML IJ SOLN *WRAPPED*
2.0000 mg | INTRAMUSCULAR | Status: AC | PRN
Start: 2020-07-08 — End: 2020-07-08
  Administered 2020-07-08 (×3): 2 mg via INTRAVENOUS
  Filled 2020-07-08 (×3): qty 1

## 2020-07-08 NOTE — ED Provider Notes (Signed)
History     Chief Complaint   Patient presents with    Sickle Cell Pain Crisis     Matthew Rush is a 30 y.o. male with history of sickle cell disease who presents with pain.  Pain is "all over" his body.  He denies any specific area of pain.  He denies specific chest pain.  He is reportedly visiting from out of town due to a family member who is dying.  He has been experiencing this pain for the last few days and has run out of his home analgesia.  Pain has been gradually getting worse.          Medical/Surgical/Family History     History reviewed. No pertinent past medical history.     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 No pertinent surgical history.  No family history on file.       Social History     Tobacco Use    Smoking status: Not on file    Smokeless tobacco: Not on file   Substance Use Topics    Alcohol use: Not on file    Drug use: Not on file     Living Situation     Questions Responses    Patient lives with     Homeless     Caregiver for other family member     External Services     Employment     Domestic Violence Risk                 Review of Systems   Review of Systems    Constitutional: Negative for fever.   HENT: Negative for sore throat.    Eyes: Negative for visual disturbance.   Respiratory: Negative for cough and shortness of breath.    Cardiovascular: Negative for chest pain.   Gastrointestinal: Negative for abdominal pain, nausea and vomiting.   Musculoskeletal: Positive for myalgias.   Skin: Negative for rash.   Neurological: Negative for syncope and light-headedness.   Psychiatric/Behavioral: Negative for confusion.       Physical Exam     Triage Vitals  Triage Start: Start, (07/08/20 1501)   First Recorded BP: 118/71, Resp: 14, Temp: 36.7 C (98.1 F) Oxygen Therapy SpO2: 92 %, O2 Device: O2 Therapy, O2 Therapy: Nasal cannula, O2 Flow Rate: 3 L/min, Heart Rate: 77, (07/08/20 1502)  .  First Pain Reported  0-10 Scale: 10, Pain Location/Orientation: Generalized, (07/08/20 1502)  Physical Exam  Vitals and nursing note reviewed.   Constitutional:       General: He is not in acute distress.     Appearance: He is well-developed. He is not diaphoretic.   HENT:      Head: Normocephalic and atraumatic.   Eyes:      General: No scleral icterus.        Right eye: No discharge.         Left eye: No discharge.      Conjunctiva/sclera: Conjunctivae normal.   Cardiovascular:      Rate and Rhythm: Normal rate.   Pulmonary:      Effort: Pulmonary effort is normal. No respiratory distress.   Abdominal:      General: There is no distension.   Musculoskeletal:         General: Normal range of motion.      Cervical back: Normal range of motion.   Skin:     General: Skin is warm and dry.   Neurological:      Mental Status: He is alert and oriented to person, place, and time.      Motor: No abnormal muscle tone.   Psychiatric:         Behavior: Behavior normal.         Thought Content: Thought content normal.         Medical Decision Making   Patient seen by me on:  07/08/2020    Assessment:  Matthew Rush is a 30 y.o. male with history of sickle cell disease who presents with pain.   This appears to be most consistent with a sickle cell pain crisis.  Labs in process to evaluate further.  Medications ordered as per typical pain protocols, although patient does not have a specific documented protocol as he is from out of town.  Unless his pain improved significantly, he may require hospitalization for continued care.    Differential diagnosis:  Sickle cell disease, sickle cell pain crisis, vaso-occlusive crisis, anemia.    Plan:    Diagnostics: cbc, cmp, retics  Therapeutic: IV analgesia, IVFs                Salem Senate, MD          Salem Senate, MD  07/08/20 2057

## 2020-07-08 NOTE — ED Triage Notes (Addendum)
Pt presents with generalized sickle cell pain x 3 days. Home meds not relieving pain. Denies chest pain or dyspnea. Placed on 3L NC per request and to keep SpO2 at 92%.            Prehospital medications given: No

## 2020-07-08 NOTE — ED Notes (Signed)
Pt to the ED with sickle cell pain that he states started 3 day ago. Pt is from [place] and in town for the death of a family member. Patient denies any chest pain or SOB. Presents with generalized body pain and nausea. Patient endorses 10/10 pain. Will continue to treat, per orders.

## 2020-07-08 NOTE — Discharge Instructions (Signed)
While in the Emergency Department you received IV fluids and IV medications for pain.    Call your doctor in the morning as we discussed.    If any symptoms worsen or new symptoms develop that are concerning to you, call your doctor or return to the Emergency Department.

## 2020-07-09 ENCOUNTER — Emergency Department: Payer: Self-pay

## 2020-07-09 ENCOUNTER — Inpatient Hospital Stay
Admission: EM | Admit: 2020-07-09 | Discharge: 2020-07-12 | DRG: 662 | Disposition: A | Discharge status: Home / Self Care | Payer: Self-pay | Source: Ambulatory Visit | Attending: Internal Medicine | Admitting: Internal Medicine

## 2020-07-09 ENCOUNTER — Encounter: Payer: Self-pay | Admitting: Emergency Medicine

## 2020-07-09 DIAGNOSIS — R0902 Hypoxemia: Secondary | ICD-10-CM

## 2020-07-09 DIAGNOSIS — M25551 Pain in right hip: Secondary | ICD-10-CM

## 2020-07-09 DIAGNOSIS — D5700 Hb-SS disease with crisis, unspecified: Principal | ICD-10-CM | POA: Diagnosis present

## 2020-07-09 DIAGNOSIS — J9611 Chronic respiratory failure with hypoxia: Secondary | ICD-10-CM | POA: Diagnosis present

## 2020-07-09 DIAGNOSIS — M545 Low back pain, unspecified: Secondary | ICD-10-CM

## 2020-07-09 DIAGNOSIS — Z86711 Personal history of pulmonary embolism: Secondary | ICD-10-CM

## 2020-07-09 DIAGNOSIS — B20 Human immunodeficiency virus [HIV] disease: Secondary | ICD-10-CM | POA: Diagnosis present

## 2020-07-09 DIAGNOSIS — I288 Other diseases of pulmonary vessels: Secondary | ICD-10-CM

## 2020-07-09 DIAGNOSIS — M25552 Pain in left hip: Secondary | ICD-10-CM

## 2020-07-09 DIAGNOSIS — Z20822 Contact with and (suspected) exposure to covid-19: Secondary | ICD-10-CM | POA: Diagnosis present

## 2020-07-09 DIAGNOSIS — M791 Myalgia, unspecified site: Secondary | ICD-10-CM

## 2020-07-09 DIAGNOSIS — R918 Other nonspecific abnormal finding of lung field: Secondary | ICD-10-CM

## 2020-07-09 LAB — CBC AND DIFFERENTIAL
Baso # K/uL: 0 10*3/uL (ref 0.0–0.1)
Basophil %: 0 %
Eos # K/uL: 0.2 10*3/uL (ref 0.0–0.5)
Eosinophil %: 2 %
Hematocrit: 20 % — ABNORMAL LOW (ref 40–51)
Hemoglobin: 7.6 g/dL — ABNORMAL LOW (ref 13.7–17.5)
Lymph # K/uL: 4.5 10*3/uL — ABNORMAL HIGH (ref 1.3–3.6)
Lymphocyte %: 45 %
MCH: 37 pg — ABNORMAL HIGH (ref 26–32)
MCHC: 38 g/dL — ABNORMAL HIGH (ref 32–37)
MCV: 99 fL — ABNORMAL HIGH (ref 79–92)
Mono # K/uL: 1.1 10*3/uL — ABNORMAL HIGH (ref 0.3–0.8)
Monocyte %: 11 %
Neut # K/uL: 4.2 10*3/uL (ref 1.8–5.4)
Nucl RBC # K/uL: 1.2 10*3/uL — ABNORMAL HIGH (ref 0.0–0.0)
Nucl RBC %: 12 /100 WBC — ABNORMAL HIGH (ref 0.0–0.2)
Platelets: 252 10*3/uL (ref 150–330)
RBC: 2 MIL/uL — ABNORMAL LOW (ref 4.6–6.1)
RDW: 23.6 % — ABNORMAL HIGH (ref 11.6–14.4)
Seg Neut %: 41 %
WBC: 10 10*3/uL — ABNORMAL HIGH (ref 4.2–9.1)

## 2020-07-09 LAB — COVID-19 PCR

## 2020-07-09 LAB — COMPREHENSIVE METABOLIC PANEL
ALT: 18 U/L (ref 0–50)
AST: 49 U/L (ref 0–50)
Albumin: 3.9 g/dL (ref 3.5–5.2)
Alk Phos: 68 U/L (ref 40–130)
Anion Gap: 10 (ref 7–16)
Bilirubin,Total: 8.4 mg/dL — ABNORMAL HIGH (ref 0.0–1.2)
CO2: 25 mmol/L (ref 20–28)
Calcium: 8.5 mg/dL — ABNORMAL LOW (ref 9.0–10.3)
Chloride: 106 mmol/L (ref 96–108)
Creatinine: 0.7 mg/dL (ref 0.67–1.17)
Glucose: 94 mg/dL (ref 60–99)
Lab: 6 mg/dL (ref 6–20)
Potassium: 4.1 mmol/L (ref 3.3–5.1)
Sodium: 141 mmol/L (ref 133–145)
Total Protein: 7.5 g/dL (ref 6.3–7.7)
eGFR BY CREAT: 127 *

## 2020-07-09 LAB — RETICULOCYTES
Retic %: 18.4 % — ABNORMAL HIGH (ref 0.7–2.3)
Retic Abs: 353.4 10*3/uL — ABNORMAL HIGH (ref 33.8–124.0)

## 2020-07-09 LAB — RBC MORPHOLOGY

## 2020-07-09 LAB — INFLUENZA B PCR: Influenza B PCR: 0

## 2020-07-09 LAB — DIFF MANUAL
Bands %: 1 % (ref 0–10)
Diff Based On: 100 CELLS

## 2020-07-09 LAB — PERFORMING LAB

## 2020-07-09 LAB — COVID-19 NAAT (PCR): COVID-19 NAAT (PCR): NEGATIVE

## 2020-07-09 LAB — INFLUENZA A: Influenza A PCR: 0

## 2020-07-09 LAB — RSV PCR: RSV PCR: 0

## 2020-07-09 MED ORDER — ABACAVIR-DOLUTEGRAVIR-LAMIVUD 600-50-300 MG PO TABS *I*
1.0000 | ORAL_TABLET | Freq: Every day | ORAL | Status: DC
Start: 2020-07-10 — End: 2020-07-12
  Administered 2020-07-10 – 2020-07-12 (×3): 1 via ORAL
  Filled 2020-07-09 (×4): qty 1

## 2020-07-09 MED ORDER — DEXTROSE 5 % FLUSH FOR PUMPS *I*
0.0000 mL/h | INTRAVENOUS | Status: DC | PRN
Start: 2020-07-09 — End: 2020-07-12

## 2020-07-09 MED ORDER — HYDROMORPHONE HCL 2 MG/ML IJ SOLN *WRAPPED*
2.0000 mg | INTRAMUSCULAR | Status: AC | PRN
Start: 2020-07-09 — End: 2020-07-09
  Administered 2020-07-09 (×3): 2 mg via INTRAVENOUS
  Filled 2020-07-09 (×3): qty 1

## 2020-07-09 MED ORDER — DIPHENHYDRAMINE HCL 50 MG/ML IJ SOLN *I*
25.0000 mg | Freq: Once | INTRAMUSCULAR | Status: AC | PRN
Start: 2020-07-09 — End: 2020-07-09
  Administered 2020-07-09: 25 mg via INTRAVENOUS
  Filled 2020-07-09: qty 1

## 2020-07-09 MED ORDER — ALPRAZOLAM 0.5 MG PO TABS *I*
1.0000 mg | ORAL_TABLET | Freq: Two times a day (BID) | ORAL | Status: DC | PRN
Start: 2020-07-09 — End: 2020-07-10
  Administered 2020-07-10: 1 mg via ORAL
  Filled 2020-07-09: qty 2

## 2020-07-09 MED ORDER — B COMPLEX-C PO TABS WRAPPED *I*
1.0000 | ORAL_TABLET | Freq: Every day | ORAL | Status: DC
Start: 2020-07-10 — End: 2020-07-12
  Administered 2020-07-10 – 2020-07-12 (×3): 1 via ORAL
  Filled 2020-07-09 (×3): qty 1

## 2020-07-09 MED ORDER — HEPARIN LOCK FLUSH 10 UNIT/ML IJ SOLN WRAPPED *I*
50.0000 [IU] | Freq: Three times a day (TID) | INTRAVENOUS | Status: DC | PRN
Start: 2020-07-09 — End: 2020-07-12
  Administered 2020-07-12: 50 [IU]
  Filled 2020-07-09: qty 5

## 2020-07-09 MED ORDER — SODIUM CHLORIDE 0.9 % FLUSH FOR PUMPS *I*
0.0000 mL/h | INTRAVENOUS | Status: DC | PRN
Start: 2020-07-09 — End: 2020-07-12

## 2020-07-09 MED ORDER — IOHEXOL 350 MG/ML (OMNIPAQUE) IV SOLN *I*
1.0000 mL | Freq: Once | INTRAVENOUS | Status: AC
Start: 2020-07-09 — End: 2020-07-09
  Administered 2020-07-09: 70 mL via INTRAVENOUS

## 2020-07-09 MED ORDER — HYDROXYUREA 500 MG PO CAPS *I*
2000.0000 mg | ORAL_CAPSULE | Freq: Every day | ORAL | Status: DC
Start: 2020-07-10 — End: 2020-07-12
  Administered 2020-07-10 – 2020-07-12 (×3): 2000 mg via ORAL
  Filled 2020-07-09 (×3): qty 4

## 2020-07-09 MED ORDER — HYDROCODONE-ACETAMINOPHEN 10-325 MG PO TABS *I*
1.0000 | ORAL_TABLET | ORAL | Status: DC | PRN
Start: 2020-07-09 — End: 2020-07-12
  Administered 2020-07-10 – 2020-07-12 (×7): 1 via ORAL
  Filled 2020-07-09 (×8): qty 1

## 2020-07-09 MED ORDER — SODIUM CHLORIDE 0.9 % INJ (FLUSH) WRAPPED *I*
10.0000 mL | Status: DC | PRN
Start: 2020-07-09 — End: 2020-07-12

## 2020-07-09 MED ORDER — HYDROMORPHONE HCL 2 MG PO TABS *I*
1.0000 mg | ORAL_TABLET | ORAL | Status: DC | PRN
Start: 2020-07-09 — End: 2020-07-10

## 2020-07-09 MED ORDER — SODIUM CHLORIDE 0.9 % IV BOLUS *I*
1000.0000 mL | Freq: Once | Status: AC
Start: 2020-07-09 — End: 2020-07-09
  Administered 2020-07-09: 1000 mL via INTRAVENOUS

## 2020-07-09 NOTE — ED Notes (Signed)
Pt reports sickle cell pain all over, worse in back and hips. Says that his is from out of town, in [place] visiting family, and is out of his home meds. EMS found pt to be hypoxic on their arrival, with an o2 sat of 82% on RA. Pt wears o2 as needed at night at home, no o2 during the day normally. Pt currently on 2L NC.

## 2020-07-09 NOTE — First Provider Contact (Signed)
ED First Provider Contact Note    Initial provider evaluation performed by   ED First Provider Contact     Date/Time Event User Comments    07/09/20 1802 ED First Provider Contact Sheryn Aldaz Initial Face to Face Provider Contact          Vital signs reviewed.    Assessment:     H/o sickle cell. Visiting from [place] due to a family member on hospice. Seen here yesterday and discharged after treatment. Returns with same pain. Out of meds at home, unable to reach PCP.     Hypoxic in triage.     Orders placed:  EKG, LABS, XRAYS and ANALGESIA     Patient requires further evaluation.     Salem Senate, MD, 07/09/2020, 6:02 PM     Salem Senate, MD  07/09/20 [phone]

## 2020-07-09 NOTE — ED Provider Notes (Addendum)
History     Chief Complaint   Patient presents with    Sickle Cell Pain Crisis    Hypoxia     30 year old male with major of PE, sickle cell anemia, HIV, GERD, anxiety presenting to the ED for evaluation of myalgias.    Patient reports that he has been experiencing generalized myalgias for the past 4 days.  He states that the pain is particularly severe in his hips bilaterally in his low back.  He reports that this is typical of his sickle cell pain.  He offers no other complaints.  Patient was seen here yesterday for similar complaint.  On arrival to ED patient was noted to be hypoxic 82% on room air patient reports at baseline his SPO2 levels are around 89/90% and that he wears 2 L NC O2 at night.    He denies chest pain, dyspnea, cough, fever, abdominal pain, nausea, emesis    He is in town from [place] visiting his aunt who is on hospice.        History provided by:  Patient  Language interpreter used: No        Medical/Surgical/Family History     History reviewed. No pertinent past medical history.     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 No pertinent surgical history.  No family history on  file.       Social History     Tobacco Use    Smoking status: Not on file    Smokeless tobacco: Not on file   Substance Use Topics    Alcohol use: Not on file    Drug use: Not on file     Living Situation     Questions Responses    Patient lives with     Homeless     Caregiver for other family member     External Services     Employment     Domestic Violence Risk                 Review of Systems   Review of Systems   Constitutional: Negative for chills and fever.   HENT: Negative for sore throat.    Respiratory: Negative for cough, shortness of breath and wheezing.    Cardiovascular: Negative for chest pain and palpitations.   Gastrointestinal: Negative for abdominal pain, diarrhea, nausea and vomiting.   Genitourinary: Negative for difficulty urinating and dysuria.   Musculoskeletal: Positive for back pain and myalgias. Negative for  arthralgias.   Skin: Negative for color change and rash.   Neurological: Negative for dizziness, weakness, light-headedness, numbness and headaches.       Physical Exam     Triage Vitals  Triage Start: Start, (07/09/20 1800)   First Recorded BP: 106/69, Resp: 18, Temp: 37.1 C (98.8 F), Temp src: TEMPORAL Oxygen Therapy SpO2: 97 %, O2 Device: O2 Therapy, O2 Therapy: Nasal cannula, O2 Flow Rate: 6 L/min, Heart Rate: 94, (07/09/20 1759) Heart Rate (via Pulse Ox): 81, (07/09/20 1935).      Physical Exam  Vitals and nursing note reviewed.   Constitutional:       Appearance: He is well-developed.      Comments: Pleasant male, no obvious distress.   HENT:      Head: Normocephalic and atraumatic.      Right Ear: External ear normal.      Left Ear: External ear normal.      Nose: Nose normal.   Eyes:      Conjunctiva/sclera: Conjunctivae normal.      Pupils: Pupils are equal, round, and reactive to light.   Cardiovascular:      Rate and Rhythm: Normal rate and regular rhythm.      Heart sounds: Normal heart sounds. No murmur heard.    No friction rub.      Comments: Radial pulse 2+  bilaterally  Pulmonary:      Effort: Pulmonary effort is normal. No respiratory distress.      Breath sounds: Normal breath sounds. No wheezing or rales.      Comments: Breathing easily.  Lungs clear to auscultation all fields.  Abdominal:      General: Bowel sounds are normal.      Palpations: Abdomen is soft.      Tenderness: There is no abdominal tenderness.   Musculoskeletal:         General: Tenderness present. No deformity. Normal range of motion.      Cervical back: Normal range of motion and neck supple.      Comments: No lower extremity edema.  No calf tenderness bilaterally.  There are some soft tissue tenderness overlying the greater trochanters bilaterally.   Lymphadenopathy:      Cervical: No cervical adenopathy.   Skin:     General: Skin is warm and dry.      Findings: No erythema or rash.   Neurological:      Mental Status: He is alert and oriented to person, place, and time.         Medical Decision Making   Patient seen by me on:  07/09/2020    Assessment:  30 year old male with major of PE, sickle cell anemia, HIV, GERD, anxiety comes ED with myalgias x4 days.  At time of interview hypoxia 87% on room air increased to 93% on 2 L NC O2.  No respiratory complaints.  No signs of respiratory distress on physical exam.  No chest pain.    Differential diagnosis:  Acute chest, PE, vaso-occlusive sickle cell crisis, dehydration, anemia, metabolic derangement, pneumonia    Plan:  Orders Placed This Encounter      Influenza A PCR      Influenza B PCR      RSV PCR      *Chest STANDARD single view      CT angio chest      CBC and differential      Comprehensive metabolic panel      Reticulocytes  COVID-19 PCR      May use line (invasive line)      Nursing communication      Pulse oximetry, continuous      Insert peripheral IV    Medications  sodium chloride 0.9 % FLUSH REQUIRED IF PATIENT HAS IV (has no administration in time range)  dextrose 5 % FLUSH REQUIRED IF PATIENT HAS IV (has no administration in  time range)  sodium chloride 0.9 % bolus 1,000 mL (has no administration in time range)  sodium chloride 0.9 % bolus 1,000 mL (1,000 mLs Intravenous New Bag 07/09/20 1935)  HYDROmorphone (DILAUDID) injection 2 mg (2 mg Intravenous Given 07/09/20 2008)  heparin lock flush injection 50 units (has no administration in time range)  sodium chloride 0.9 % flush 10 mL (has no administration in time range)  iohexol (OMNIPAQUE) 350 MG/ML injection 1-150 mL (has no administration in time range)  diphenhydrAMINE (BENADRYL) 50 mg/mL injection 25 mg (25 mg Intravenous Given 07/09/20 1931)      ED Course and Disposition:  Labs overall reassuring, CT angio chest was obtained negative for PE.  Patient was persistently hypoxic 87% on room air this was present to her SPO2.  Unclear if this is baseline for patient however given his SPO2 levels opted to admit him to medical observation.  Patient agreeable with plan.      Labs Reviewed  CBC AND DIFFERENTIAL - Abnormal; Notable for the following components:     WBC                           10.0 (*)               RBC                           2.0 (*)                Hemoglobin                    7.6 (*)                Hematocrit                    20 (*)                 MCV                           99 (*)                 MCH                           37 (*)                 MCHC                          38 (*)                 RDW                           23.6 (*)               Lymph # K/uL  4.5 (*)                Mono # K/uL                   1.1 (*)                Nucl RBC %                    12.0 (*)               Nucl RBC # K/uL               1.2 (*)             All other components within normal limits  COMPREHENSIVE METABOLIC PANEL - Abnormal; Notable for the following components:     Calcium                       8.5 (*)                Bilirubin,Total               8.4 (*)             All other components within normal limits  RETICULOCYTES - Abnormal; Notable for the  following components:     Retic %                       18.4 (*)               Retic Abs                     353.4 (*)            All other components within normal limits  INFLUENZA A         Narrative: Negative for Influenza Virus A Nucleic Acid                  Test Method:  Nucleic Acid Amplification by PCR                  Normal Value:  Negative  INFLUENZA B PCR         Narrative: Negative for Influenza Virus B Nucleic Acid                  Test Method:  Nucleic Acid Amplification by PCR                  Normal Value:  Negative  RSV PCR         Narrative: Negative for Respiratory Syncytial Virus Nucleic Acid                  Test Method:  Nucleic Acid Amplification by PCR                  Normal Value:  Negative  COVID-19 PCR  PERFORMING LAB  DIFF MANUAL  [facility]              Madalyn Rob, PA      APP Review:    I had face-to-face interaction with the patient on 07/09/2020.    I was asked by APP to see this patient due to the complexity of the current medical presentation.      I have reviewed and agree with the above documentation and, in addition:  Exam is notable for Pt resting comfortably in bed. Agree with above plan.   .          Author:  Dereck Ligas, DO       Madalyn Rob, PA  07/09/20 2344       Dereck Ligas, DO  07/10/20 1093

## 2020-07-09 NOTE — ED Triage Notes (Addendum)
Pt to ED for c/o sickle cell crisis, generalized body aches. Pt found to be 82% on RA. Pt placed on 5L O2 with good results. Pt is from out of town. PT ran out of home meds            Prehospital medications given: No

## 2020-07-10 DIAGNOSIS — D5700 Hb-SS disease with crisis, unspecified: Principal | ICD-10-CM

## 2020-07-10 DIAGNOSIS — B20 Human immunodeficiency virus [HIV] disease: Secondary | ICD-10-CM

## 2020-07-10 LAB — CBC AND DIFFERENTIAL
Baso # K/uL: 0 10*3/uL (ref 0.0–0.1)
Basophil %: 0 %
Eos # K/uL: 0.2 10*3/uL (ref 0.0–0.5)
Eosinophil %: 2 %
Hematocrit: 20 % — ABNORMAL LOW (ref 40–51)
Hemoglobin: 7.3 g/dL — ABNORMAL LOW (ref 13.7–17.5)
Lymph # K/uL: 5.8 10*3/uL — ABNORMAL HIGH (ref 1.3–3.6)
Lymphocyte %: 59 %
MCH: 37 pg — ABNORMAL HIGH (ref 26–32)
MCHC: 37 g/dL (ref 32–37)
MCV: 99 fL — ABNORMAL HIGH (ref 79–92)
Mono # K/uL: 0.5 10*3/uL (ref 0.3–0.8)
Monocyte %: 5 %
Neut # K/uL: 3.3 10*3/uL (ref 1.8–5.4)
Nucl RBC # K/uL: 0.1 10*3/uL — ABNORMAL HIGH (ref 0.0–0.0)
Nucl RBC %: 1 /100 WBC — ABNORMAL HIGH (ref 0.0–0.2)
Platelets: 216 10*3/uL (ref 150–330)
RBC: 2 MIL/uL — ABNORMAL LOW (ref 4.6–6.1)
RDW: 23.1 % — ABNORMAL HIGH (ref 11.6–14.4)
Seg Neut %: 34 %
WBC: 9.8 10*3/uL — ABNORMAL HIGH (ref 4.2–9.1)

## 2020-07-10 LAB — DIFF MANUAL: Diff Based On: 100 CELLS

## 2020-07-10 LAB — BASIC METABOLIC PANEL
Anion Gap: 9 (ref 7–16)
CO2: 25 mmol/L (ref 20–28)
Calcium: 8.3 mg/dL — ABNORMAL LOW (ref 9.0–10.3)
Chloride: 106 mmol/L (ref 96–108)
Creatinine: 0.61 mg/dL — ABNORMAL LOW (ref 0.67–1.17)
Glucose: 83 mg/dL (ref 60–99)
Lab: 4 mg/dL — ABNORMAL LOW (ref 6–20)
Potassium: 3.8 mmol/L (ref 3.3–5.1)
Sodium: 140 mmol/L (ref 133–145)
eGFR BY CREAT: 132 *

## 2020-07-10 LAB — RBC MORPHOLOGY

## 2020-07-10 MED ORDER — SODIUM CHLORIDE 0.9 % IV SOLN WRAPPED *I*
125.0000 mL/h | Status: DC
Start: 2020-07-10 — End: 2020-07-12
  Administered 2020-07-10: 125 mL/h via INTRAVENOUS
  Administered 2020-07-10: 125 mL/h
  Administered 2020-07-10 (×2): 125 mL/h via INTRAVENOUS
  Administered 2020-07-10: 125 mL/h
  Administered 2020-07-10: 20 mL/h
  Administered 2020-07-11: 125 mL/h
  Administered 2020-07-11: 20 mL/h
  Administered 2020-07-11: 125 mL/h via INTRAVENOUS
  Administered 2020-07-11 (×2): 125 mL/h
  Administered 2020-07-11: 20 mL/h
  Administered 2020-07-11: 125 mL/h
  Administered 2020-07-11: 20 mL/h
  Administered 2020-07-11 – 2020-07-12 (×3): 125 mL/h via INTRAVENOUS

## 2020-07-10 MED ORDER — ALPRAZOLAM 0.5 MG PO TABS *I*
1.0000 mg | ORAL_TABLET | Freq: Two times a day (BID) | ORAL | Status: AC | PRN
Start: 2020-07-10 — End: 2020-07-11
  Administered 2020-07-10: 1 mg via ORAL
  Filled 2020-07-10: qty 2

## 2020-07-10 MED ORDER — ACETAMINOPHEN 500 MG PO TABS *I*
1000.0000 mg | ORAL_TABLET | Freq: Three times a day (TID) | ORAL | Status: DC
Start: 2020-07-10 — End: 2020-07-10

## 2020-07-10 MED ORDER — IPRATROPIUM-ALBUTEROL 0.5-2.5 MG/3ML IN SOLN *I*
3.0000 mL | Freq: Four times a day (QID) | RESPIRATORY_TRACT | Status: DC | PRN
Start: 2020-07-10 — End: 2020-07-12

## 2020-07-10 MED ORDER — SODIUM CHLORIDE 0.9 % FLUSH FOR PUMPS *I*
0.0000 mL/h | INTRAVENOUS | Status: DC | PRN
Start: 2020-07-10 — End: 2020-07-10

## 2020-07-10 MED ORDER — DIPHENHYDRAMINE HCL 50 MG/ML IJ SOLN *I*
25.0000 mg | Freq: Once | INTRAMUSCULAR | Status: AC
Start: 2020-07-10 — End: 2020-07-10
  Administered 2020-07-10: 25 mg via INTRAVENOUS
  Filled 2020-07-10: qty 1

## 2020-07-10 MED ORDER — HYDROMORPHONE HCL PF 1 MG/ML IJ SOLN *WRAPPED*
1.0000 mg | INTRAMUSCULAR | Status: DC | PRN
Start: 2020-07-10 — End: 2020-07-12
  Administered 2020-07-10 – 2020-07-12 (×23): 1 mg via INTRAVENOUS
  Filled 2020-07-10 (×24): qty 1

## 2020-07-10 MED ORDER — DIPHENHYDRAMINE HCL 50 MG/ML IJ SOLN *I*
25.0000 mg | Freq: Four times a day (QID) | INTRAMUSCULAR | Status: DC | PRN
Start: 2020-07-10 — End: 2020-07-12
  Administered 2020-07-10 – 2020-07-12 (×8): 25 mg via INTRAVENOUS
  Filled 2020-07-10 (×8): qty 1

## 2020-07-10 MED ORDER — ONDANSETRON HCL 2 MG/ML IV SOLN *I*
4.0000 mg | Freq: Four times a day (QID) | INTRAMUSCULAR | Status: DC | PRN
Start: 2020-07-10 — End: 2020-07-12

## 2020-07-10 MED ORDER — DEXTROSE 5 % FLUSH FOR PUMPS *I*
0.0000 mL/h | INTRAVENOUS | Status: DC | PRN
Start: 2020-07-10 — End: 2020-07-10

## 2020-07-10 MED ORDER — SODIUM CHLORIDE 0.9 % INJ (FLUSH) WRAPPED *I*
3.0000 mL | Freq: Three times a day (TID) | Status: DC
Start: 2020-07-10 — End: 2020-07-12
  Administered 2020-07-10 – 2020-07-12 (×7): 3 mL via INTRAVENOUS

## 2020-07-10 MED ORDER — ENOXAPARIN SODIUM 40 MG/0.4ML IJ SOSY *I*
40.0000 mg | PREFILLED_SYRINGE | Freq: Every day | INTRAMUSCULAR | Status: DC
Start: 2020-07-10 — End: 2020-07-12
  Filled 2020-07-10: qty 0.4

## 2020-07-10 NOTE — Progress Notes (Signed)
07/10/20 1047   [facility] Patient Class Review   Patient Class Review Observation     Patient Class effective 07/09/20    Gwynneth Aliment RN   Utilization Management Nurse  Office: [phone]  Pager: [phone]

## 2020-07-10 NOTE — H&P (Signed)
General H&P for Inpatients    PT seen 07/09/20 at 2245    Chief Complaint: "I was having pain"    History of Present Illness:  54M with HIV, SS, Hypoxia on 2L NC who presents to the ED with complaints of hip pain. Pt states he is from [place] and is visiting family when he started having diffuse body pain reminiscent of his SS pain, worse in hip joints. Pt admitted to medicine for SS pain control. No other complaints.       History reviewed. No pertinent past medical history.  History reviewed. No pertinent surgical history.  History reviewed. No pertinent family history.  Social History     Socioeconomic History    Marital status: Single     Spouse name: Not on file    Number of children: Not on file    Years of education: Not on file    Highest education level: Not on file   Tobacco Use    Smoking status: Not on file    Smokeless tobacco: Not on file   Substance and Sexual Activity    Alcohol use: Not on file    Drug use: Not on file    Sexual activity: Not on file   Other Topics Concern    Not on file   Social History Narrative    Not on file       Allergies:   Allergies   Allergen Reactions    Toradol [Ketorolac Tromethamine] Swelling       (Not in a hospital admission)     Current Facility-Administered Medications   Medication Dose Route Frequency    diphenhydrAMINE (BENADRYL) 50 mg/mL injection 25 mg  25 mg Intravenous Once    sodium chloride 0.9 % FLUSH REQUIRED IF PATIENT HAS IV  0-500 mL/hr Intravenous PRN    dextrose 5 % FLUSH REQUIRED IF PATIENT HAS IV  0-500 mL/hr Intravenous PRN    sodium chloride 0.9 % flush 3 mL  3 mL Intravenous Q8H    enoxaparin (LOVENOX) injection 40 mg  40 mg Subcutaneous Daily @ 2100    sodium chloride 0.9 % IV  125 mL/hr Intravenous Continuous    acetaminophen (TYLENOL) tablet 1,000 mg  1,000 mg Oral TID    ondansetron (ZOFRAN) injection 4 mg  4 mg Intravenous Q6H PRN    sodium chloride 0.9 % FLUSH REQUIRED IF PATIENT HAS IV  0-500 mL/hr Intravenous PRN     dextrose 5 % FLUSH REQUIRED IF PATIENT HAS IV  0-500 mL/hr Intravenous PRN    heparin lock flush injection 50 units  50 units Intracatheter Q8H PRN    sodium chloride 0.9 % flush 10 mL  10 mL Intravenous PRN    abacavir-dolutegravir-lamiVUDine (TRIUMEQ) 600-50-300 mg per tablet 1 tablet  1 tablet Oral Daily    ALPRAZolam (XANAX) tablet 1 mg  1 mg Oral BID PRN    hydroxyurea (HYDREA) 500 mg capsule 2,000 mg  2,000 mg Oral Daily    HYDROcodone-acetaminophen (NORCO) 10-325 mg per tablet 1 tablet  1 tablet Oral Q4H PRN    vitamin B complex w/ C tablet 1 tablet  1 tablet Oral Daily    HYDROmorphone (DILAUDID) tablet 1 mg  1 mg Oral Q2H PRN     Current Outpatient Medications   Medication    hydroxyurea (HYDREA) 500 mg capsule    abacavir-dolutegravir-lamiVUDine (TRIUMEQ) 600-50-300 mg tablet    vitamin B complex w/ C tab    cyanocobalamin 500 MCG tablet  ALPRAZolam (XANAX XR) 2 MG 24 hr tablet    oxyCODONE-acetaminophen (PERCOCET) 10-325 mg per tablet       Review of Systems:   Review of Systems   Constitutional: Negative for chills and fever.   HENT: Negative for sinus pain.    Eyes: Negative for blurred vision.   Respiratory: Negative for cough.    Cardiovascular: Negative for chest pain.   Gastrointestinal: Negative for abdominal pain, heartburn and nausea.   Genitourinary: Negative for dysuria.   Musculoskeletal: Positive for joint pain and myalgias.   Neurological: Negative for dizziness.   Endo/Heme/Allergies: Does not bruise/bleed easily.   Psychiatric/Behavioral: Negative for depression.   All other systems reviewed and are negative.      Last Nursing documented pain:  0-10 Scale: 10 (07/09/20 1930)      Patient Vitals for the past 24 hrs:   BP Temp Temp src Pulse Resp SpO2 Height Weight   07/09/20 2249 115/76 36.9 C (98.4 F) TEMPORAL 79 16 94 %     07/09/20 1940      93 %     07/09/20 1935      (!) 87 %     07/09/20 1759 106/69 37.1 C (98.8 F) TEMPORAL 94 18 97 % 1.905 m (6'  3") 68 kg (150 lb)     O2 Flow Rate: 2 L/min (07/09/20 2249)      Physical Exam    Lab Results:   All labs in the last 24 hours:   Recent Results (from the past 24 hour(s))   CBC and differential    Collection Time: 07/09/20  7:35 PM   Result Value Ref Range    WBC 10.0 (H) 4.2 - 9.1 THOU/uL    RBC 2.0 (L) 4.6 - 6.1 MIL/uL    Hemoglobin 7.6 (L) 13.7 - 17.5 g/dL    Hematocrit 20 (L) 40 - 51 %    MCV 99 (H) 79 - 92 fL    MCH 37 (H) 26 - 32 pg    MCHC 38 (H) 32 - 37 g/dL    RDW 23.6 (H) 11.6 - 14.4 %    Platelets 252 150 - 330 THOU/uL    Seg Neut % 41.0 %    Lymphocyte % 45.0 %    Monocyte % 11.0 %    Eosinophil % 2.0 %    Basophil % 0.0 %    Neut # K/uL 4.2 1.8 - 5.4 THOU/uL    Lymph # K/uL 4.5 (H) 1.3 - 3.6 THOU/uL    Mono # K/uL 1.1 (H) 0.3 - 0.8 THOU/uL    Eos # K/uL 0.2 0.0 - 0.5 THOU/uL    Baso # K/uL 0.0 0.0 - 0.1 THOU/uL    Nucl RBC % 12.0 (H) 0.0 - 0.2 /100 WBC    Nucl RBC # K/uL 1.2 (H) 0.0 - 0.0 THOU/uL   Comprehensive metabolic panel    Collection Time: 07/09/20  7:35 PM   Result Value Ref Range    Sodium 141 133 - 145 mmol/L    Potassium 4.1 3.3 - 5.1 mmol/L    Chloride 106 96 - 108 mmol/L    CO2 25 20 - 28 mmol/L    Anion Gap 10 7 - 16    UN 6 6 - 20 mg/dL    Creatinine 0.70 0.67 - 1.17 mg/dL    eGFR BY CREAT 127 *    Glucose 94 60 - 99 mg/dL    Calcium 8.5 (L)  9.0 - 10.3 mg/dL    Total Protein 7.5 6.3 - 7.7 g/dL    Albumin 3.9 3.5 - 5.2 g/dL    Bilirubin,Total 8.4 (H) 0.0 - 1.2 mg/dL    AST 49 0 - 50 U/L    ALT 18 0 - 50 U/L    Alk Phos 68 40 - 130 U/L   Reticulocytes    Collection Time: 07/09/20  7:35 PM   Result Value Ref Range    Retic % 18.4 (H) 0.7 - 2.3 %    Retic Abs 353.4 (H) 33.8 - 124.0 THOU/uL   Diff manual    Collection Time: 07/09/20  7:35 PM   Result Value Ref Range    Bands % 1 0 - 10 %    Manual DIFF RESULTS     Diff Based On 100 CELLS   RBC morphology    Collection Time: 07/09/20  7:35 PM   Result Value Ref Range    RBC Morphology Present     Anisocytosis Present     Macrocytes Present      Polychromasia Present     Dense Elliptocytes Present     Sickle Cells Present     Howell-Jolly Bodies Present     Agglutination Present    COVID-19 PCR    Collection Time: 07/09/20  8:10 PM   Result Value Ref Range    COVID-19 Source Nasopharyngeal     COVID-19 PCR NEGATIVE NEG   Influenza A PCR    Collection Time: 07/09/20  8:10 PM    Specimen: Nasopharyngeal   Result Value Ref Range    Influenza A PCR .    Influenza B PCR    Collection Time: 07/09/20  8:10 PM    Specimen: Nasopharyngeal   Result Value Ref Range    Influenza B PCR .    RSV PCR    Collection Time: 07/09/20  8:10 PM    Specimen: Nasopharyngeal   Result Value Ref Range    RSV PCR .    Performing Lab    Collection Time: 07/09/20  8:10 PM   Result Value Ref Range    Performing Lab see below        Radiology Impressions (last 3 days):  CT angio chest    Result Date: 07/09/2020  No evidence for central pulmonary embolus. Enlarged pulmonary arteries likely related to pulmonary artery hypertension. Extensive mediastinal hilar and axillary lymphadenopathy. Groundglass opacity within the lungs bilaterally including a 0.6 cm groundglass nodule in the left upper lobe and areas of parenchymal changes within the left upper lobe and lower lobes bilaterally. These could relate to infection, inflammation, scarring  or less likely malignancy. Correlation with old examinations is recommended to assure stability. If not available a 3 month follow-up chest CT could be considered. END OF IMPRESSION UR Imaging submits this DICOM format image data and final report to the [facility], an independent secure electronic health information exchange, on a reciprocally searchable basis (with patient authorization) for a minimum of 12 months after exam date.    *Chest STANDARD single view    Result Date: 07/09/2020  Diffuse interstitial prominence likely related to chronic change. Superimposed pulmonary edema or pneumonia cannot be fully excluded. END OF IMPRESSION [facility]  submits this DICOM format image data and final report to the [facility], an independent secure electronic health information exchange, on a reciprocally searchable basis (with patient authorization) for a minimum of 12 months after exam date.    *  Chest standard frontal and lateral views    Result Date: 07/08/2020  Findings most consistent with chronic lung disease with possible superimposed atelectasis or pneumonia in the mid right lung. END OF IMPRESSION UR Imaging submits this DICOM format image data and final report to the [facility], an independent secure electronic health information exchange, on a reciprocally searchable basis (with patient authorization) for a minimum of 12 months after exam date.    *Chest standard frontal and lateral views    Result Date: 07/07/2020  Overall imaging appearances suggestive of chronic lung disease with a possible superadded developing infectious process in the right mid/lower zone. END OF IMPRESSION UR [facility] submits this DICOM format image data and final report to the [facility], an independent secure electronic health information exchange, on a reciprocally searchable basis (with patient authorization) for a minimum of 12 months after exam date.      Currently Active/Followed Hospital Problems:  Active Hospital Problems    Diagnosis     Hypoxia        Assessment: 50M with SS pain    Plan:   1. SS pain  Continue with home norco  Dilaudid for breatk through  Pt would like enema for prn constipation  IV fluids  Supportive care    2. HIV  triumeq    3. DVT pplx  lovenox    4. Chronic hypoxia  Home NC 2L    Author: Alessandra Grout, MD  Note created: 07/10/2020  at: 12:55 AM

## 2020-07-10 NOTE — Plan of Care (Signed)
Assuming care from Dr. Yetta Barre, admitted today by her.See her H&P for details. Records reviewed via Care Everywhere, interviewed and examined. Pain lower back and hip but overall improved. For now continue current pain regimen, IVF. Agreed to discuss, tapering in AM.

## 2020-07-11 ENCOUNTER — Telehealth: Payer: Self-pay

## 2020-07-11 DIAGNOSIS — J9610 Chronic respiratory failure, unspecified whether with hypoxia or hypercapnia: Secondary | ICD-10-CM

## 2020-07-11 LAB — DIFF MANUAL: Diff Based On: 100 CELLS

## 2020-07-11 LAB — RBC MORPHOLOGY

## 2020-07-11 LAB — CBC AND DIFFERENTIAL
Baso # K/uL: 0.1 10*3/uL (ref 0.0–0.1)
Basophil %: 1 %
Eos # K/uL: 0.3 10*3/uL (ref 0.0–0.5)
Eosinophil %: 3 %
Hematocrit: 20 % — ABNORMAL LOW (ref 40–51)
Hemoglobin: 7.5 g/dL — ABNORMAL LOW (ref 13.7–17.5)
Lymph # K/uL: 4.6 10*3/uL — ABNORMAL HIGH (ref 1.3–3.6)
Lymphocyte %: 43 %
MCH: 38 pg — ABNORMAL HIGH (ref 26–32)
MCHC: 37 g/dL (ref 32–37)
MCV: 103 fL — ABNORMAL HIGH (ref 79–92)
Mono # K/uL: 0.9 10*3/uL — ABNORMAL HIGH (ref 0.3–0.8)
Monocyte %: 8 %
Neut # K/uL: 4.9 10*3/uL (ref 1.8–5.4)
Nucl RBC # K/uL: 0.9 10*3/uL — ABNORMAL HIGH (ref 0.0–0.0)
Nucl RBC %: 8 /100 WBC — ABNORMAL HIGH (ref 0.0–0.2)
Platelets: 219 10*3/uL (ref 150–330)
RBC: 2 MIL/uL — ABNORMAL LOW (ref 4.6–6.1)
RDW: 23.9 % — ABNORMAL HIGH (ref 11.6–14.4)
Seg Neut %: 45 %
WBC: 10.8 10*3/uL — ABNORMAL HIGH (ref 4.2–9.1)

## 2020-07-11 LAB — BASIC METABOLIC PANEL
Anion Gap: 10 (ref 7–16)
CO2: 26 mmol/L (ref 20–28)
Calcium: 8.2 mg/dL — ABNORMAL LOW (ref 9.0–10.3)
Chloride: 106 mmol/L (ref 96–108)
Creatinine: 0.82 mg/dL (ref 0.67–1.17)
Glucose: 105 mg/dL — ABNORMAL HIGH (ref 60–99)
Lab: 6 mg/dL (ref 6–20)
Potassium: 4.3 mmol/L (ref 3.3–5.1)
Sodium: 142 mmol/L (ref 133–145)
eGFR BY CREAT: 121 *

## 2020-07-11 MED ORDER — ALPRAZOLAM 0.5 MG PO TABS *I*
1.0000 mg | ORAL_TABLET | Freq: Two times a day (BID) | ORAL | Status: DC | PRN
Start: 2020-07-11 — End: 2020-07-12
  Administered 2020-07-12: 1 mg via ORAL
  Filled 2020-07-11: qty 2

## 2020-07-11 NOTE — Progress Notes (Signed)
07/11/20 1300   [facility] Patient Class Review   Patient Class Review Inpatient   Patient Class effective 07/11/20

## 2020-07-11 NOTE — Progress Notes (Signed)
Inpatient Medicine Progress Note    Interval History:  "pain all over" pain down from 9 to 7. Wishes to continue current regimen and re-address in AM. Feels congested by no dyspnea or CP    Intake/Output    Intake/Output Summary (Last 24 hours) at 07/11/2020 1720  Last data filed at 07/11/2020 1459  Gross per 24 hour   Intake 3423.87 ml   Output 2875 ml   Net 548.87 ml        Vital Signs:   Temp:  [36.3 C (97.3 F)-36.6 C (97.9 F)] 36.3 C (97.3 F)  Heart Rate:  [72-77] 74  Resp:  [18] 18  BP: (102-114)/(55-69) 102/55       Physical Exam:    General: thin, NAD  HEENT: MMM  Cardiovascular: S1,S2, reg  Pulmonary: CTA b/l post CW  Gastrointestinal: benign  Skin: warm  Psych:flat affect, depressed mood  Neuro:grossly normal  IRJ:JOACZYS normal     Data:    Recent Labs   Lab 07/11/20  0441 07/10/20  0548 07/09/20  1935   WBC 10.8* 9.8* 10.0*   Hemoglobin 7.5* 7.3* 7.6*   Hematocrit 20* 20* 20*   Platelets 219 216 252     Other Labs:    Recent Labs   Lab 07/11/20  0441 07/10/20  0548 07/09/20  1935   Sodium 142 140 141   Potassium 4.3 3.8 4.1   Chloride 106 106 106   CO2 26 25 25    UN 6 4* 6   Creatinine 0.82 0.61* 0.70   Glucose 105* 83 94   Calcium 8.2* 8.3* 8.5*      X-rays the past 24 hours: CT angio chest    Result Date: 07/09/2020  No evidence for central pulmonary embolus. Enlarged pulmonary arteries likely related to pulmonary artery hypertension. Extensive mediastinal hilar and axillary lymphadenopathy. Groundglass opacity within the lungs bilaterally including a 0.6 cm groundglass nodule in the left upper lobe and areas of parenchymal changes within the left upper lobe and lower lobes bilaterally. These could relate to infection, inflammation, scarring  or less likely malignancy. Correlation with old examinations is recommended to assure stability. If not available a 3 month follow-up chest CT could be considered. END OF IMPRESSION UR Imaging submits this DICOM format image data and final  report to the [facility], an independent secure electronic health information exchange, on a reciprocally searchable basis (with patient authorization) for a minimum of 12 months after exam date.    *Chest STANDARD single view    Result Date: 07/09/2020  Diffuse interstitial prominence likely related to chronic change. Superimposed pulmonary edema or pneumonia cannot be fully excluded. END OF IMPRESSION UR Imaging submits this DICOM format image data and final report to the [facility], an independent secure electronic health information exchange, on a reciprocally searchable basis (with patient authorization) for a minimum of 12 months after exam date.       Assessment/Plan  Active Hospital Problems    Diagnosis    Hypoxia      Resolved Hospital Problems   No resolved problems to display.     1. Sickle cell crisis  -continue Norco, hydromorphone  -hydroxyurea  -IVF    2. HIV  -Triumeq    3. Chronic respiratory failure  -on baseline O2 2 l/min    3. DVT PPX: enoxaparin    4. Code status: full code    5. Disposition: MRDD 3/28  -coordinating care and complexity: 35 minutes  Jamie Brookes, MD   07/11/2020   5:20 PM

## 2020-07-11 NOTE — Interdisciplinary Rounds (Signed)
Interdisciplinary Discharge Communication Note        Targeted Discharge Disposition : Home    Rounding was performed on: IDR Date: 07/11/20 at: IDR Time: 1302    Interdisciplinary Rounding Participants: APP, SW, Care Coordinator, Charge RN, Engineer, drilling, PT, Attending    Admit Date/Time:  07/09/2020  6:04 PM     Principal Problem:  <principal problem not specified>  The patient's problem list and interdisciplinary care plan was reviewed.    Expected Date for Discharge:             Discharge Planning    Ongoing Issues          Geographic Patient: Yes    Targeted Disposition   Targeted Discharge Disposition : Home    Home Care         Medical Equipment Needs  Current Home Equipment: None Respiratory Home Equipment : Not Applicable Medical Supplies Available: Not Applicable Equipment/Supplies Ordered: None       Delivery of D/C DME/medical supplies confirmed, if applicable: Not Applicable    Teaching Needs           Communication  Is communication necessary with patient spokesperson? : No  Why communication is not necessary: Patient has capacity     Who is communicating with patient spokesperson?: Provider    Current PCP:  Unknown, Provider    Scheduled Future Appointments:            Transportation               Prescriptions             Si Raider, RN  1:02 PM     Eliane Decree BSN, RN  RN Care Coordinator  [phone]  Pager: [phone]  1:02 PM

## 2020-07-11 NOTE — Progress Notes (Signed)
SW met with pt at bedside to check in. Pt reports that he is from [place], currently here in [place] visiting a family member that is on hospice. Writer inquired about needs at discharge and insurance not listed in medical record. Pt reports that he has no concerns about discharge and does have active insurance, [place]. However, pt is unsure of where his card is at this time. Writer asked if there is anyone that can be contacted to provide insurance info so that Probation officer can communicate info to billing. Pt reports that he is too sleepy to think about this at this time and asks that SW come back at a later time.     SW to follow.      [place], LMSW  07/11/20  9:33 AM  Pager # [phone]

## 2020-07-11 NOTE — Telephone Encounter (Signed)
Med refill Oxycodone 10mg  325 He's out of town family emergency   [facility]  [address] [place]  [place]

## 2020-07-12 ENCOUNTER — Other Ambulatory Visit: Payer: Self-pay | Admitting: Family Medicine

## 2020-07-12 ENCOUNTER — Telehealth: Payer: Self-pay

## 2020-07-12 ENCOUNTER — Other Ambulatory Visit: Payer: Self-pay

## 2020-07-12 DIAGNOSIS — D571 Sickle-cell disease without crisis: Secondary | ICD-10-CM

## 2020-07-12 DIAGNOSIS — G8929 Other chronic pain: Secondary | ICD-10-CM

## 2020-07-12 LAB — RBC MORPHOLOGY

## 2020-07-12 LAB — CBC AND DIFFERENTIAL
Baso # K/uL: 0 10*3/uL (ref 0.0–0.1)
Basophil %: 0 %
Eos # K/uL: 0.1 10*3/uL (ref 0.0–0.5)
Eosinophil %: 1 %
Hematocrit: 21 % — ABNORMAL LOW (ref 40–51)
Hemoglobin: 7.3 g/dL — ABNORMAL LOW (ref 13.7–17.5)
Lymph # K/uL: 6.7 10*3/uL — ABNORMAL HIGH (ref 1.3–3.6)
Lymphocyte %: 56 %
Mono # K/uL: 1.3 10*3/uL — ABNORMAL HIGH (ref 0.3–0.8)
Monocyte %: 11 %
Neut # K/uL: 3.8 10*3/uL (ref 1.8–5.4)
Nucl RBC # K/uL: 1.2 10*3/uL — ABNORMAL HIGH (ref 0.0–0.0)
Nucl RBC %: 10 /100 WBC — ABNORMAL HIGH (ref 0.0–0.2)
Platelets: 232 10*3/uL (ref 150–330)
RDW: 25 % — ABNORMAL HIGH (ref 11.6–14.4)
Seg Neut %: 32 %
WBC: 11.9 10*3/uL — ABNORMAL HIGH (ref 4.2–9.1)

## 2020-07-12 LAB — DIFF MANUAL: Diff Based On: 100 CELLS

## 2020-07-12 LAB — BASIC METABOLIC PANEL
Anion Gap: 8 (ref 7–16)
CO2: 25 mmol/L (ref 20–28)
Calcium: 8.3 mg/dL — ABNORMAL LOW (ref 9.0–10.3)
Chloride: 107 mmol/L (ref 96–108)
Creatinine: 0.8 mg/dL (ref 0.67–1.17)
Glucose: 84 mg/dL (ref 60–99)
Lab: 7 mg/dL (ref 6–20)
Potassium: 5.3 mmol/L — ABNORMAL HIGH (ref 3.3–5.1)
Sodium: 140 mmol/L (ref 133–145)
eGFR BY CREAT: 122 *

## 2020-07-12 MED ORDER — OXYCODONE-ACETAMINOPHEN 10-325 MG PO TABS: 1.0000 | ORAL_TABLET | ORAL | 0 refills | Status: DC | PRN

## 2020-07-12 MED ORDER — OXYCODONE-ACETAMINOPHEN 10-325 MG PO TABS *A*
1.0000 | ORAL_TABLET | ORAL | 0 refills | Status: AC | PRN
Start: 2020-07-12 — End: 2020-07-17
  Filled 2020-07-12: qty 30, 5d supply, fill #0

## 2020-07-12 NOTE — Interdisciplinary Rounds (Signed)
Interdisciplinary Discharge Communication Note        Targeted Discharge Disposition : Home    Rounding was performed on: IDR Date: 07/12/20 at: IDR Time: 1040    Interdisciplinary Rounding Participants: APP, Care Coordinator, SW, Charge RN, Engineer, drilling, PT, Attending    Admit Date/Time:  07/09/2020  6:04 PM     Principal Problem:  <principal problem not specified>  The patient's problem list and interdisciplinary care plan was reviewed.    Expected Date for Discharge: 07/12/2020           Discharge Planning    Ongoing Issues         Ready for Discharge: YesGeographic Patient: Yes    Targeted Disposition   Targeted Discharge Disposition : Home    Home Care         Medical Equipment Needs  Current Home Equipment: None Respiratory Home Equipment : Concentrator Medical Supplies Available: Not Applicable Equipment/Supplies Ordered: None       Delivery of D/C DME/medical supplies confirmed, if applicable: Not Applicable    Teaching Needs           Communication  Is communication necessary with patient spokesperson? : No  Why communication is not necessary: Patient has capacity     Who is communicating with patient spokesperson?: Provider    Current PCP:  Unknown, Provider    Scheduled Future Appointments:            Transportation               Prescriptions             Si Raider, RN  2:17 PM     Eliane Decree BSN, RN  RN Care Coordinator  [phone]  Pager: [phone]  2:18 PM

## 2020-07-12 NOTE — Progress Notes (Signed)
SW met with pt at bedside and confirmed that pt has his O2 concentrator her with him from out of town and will have access to this at discharge. Pt also has transportation arranged already so no SW needs at this time. Pt aware of how to contact SW should needs arise.     [place], LMSW  07/12/20  12:02 PM  Pager # [phone]

## 2020-07-12 NOTE — Discharge Summary (Signed)
Name: Matthew Rush MRN: [id number] DOB: [birth year]     Admit Date: 07/09/2020   Date of Discharge: 07/12/2020     Patient was accepted for discharge to   Home or Self Care [1]           Discharge Attending Physician: Jamie Brookes, MD      Hospitalization Summary    CONCISE NARRATIVE: 30 year old male with hx significant for HIV, SS, hypoxia on 2L baseline who presented to [facility] ED with hip pain. Admitted to hospital medicine for VOE, received pain meds, supportive care. Pain improved and patient stable for discharge home on 07/12/20.            CT RESULTS: 07/09/20: CT Angio:   No evidence for central pulmonary embolus.     Enlarged pulmonary arteries likely related to pulmonary artery hypertension.    Extensive mediastinal hilar and axillary lymphadenopathy.    Groundglass opacity within the lungs bilaterally including a 0.6 cm groundglass nodule in the left upper lobe and areas of parenchymal changes within the left upper lobe and lower lobes bilaterally. These could relate to infection, inflammation, scarring  or less likely malignancy. Correlation with old examinations is recommended to assure stability. If not available a 3 month follow-up chest CT could be considered.        XRAY RESULTS: 07/09/20: CXR: Diffuse interstitial prominence likely related to chronic change. Superimposed pulmonary edema or pneumonia cannot be fully excluded.              Signed: Sinclair Grooms, PA  On: 07/12/2020  at: 11:08 AM

## 2020-07-12 NOTE — Progress Notes (Signed)
Inpatient Medicine Progress Note    Interval History:    Pain improved, no dyspnea. Low recorded sat was unclear if was without O2 but at time of eval 10:30 no distress.  Reports uses supplemental O2 at night and has a concentrator. He also reported running out of his chronic Percocet resulting in presentation to ED.    Pain much improved. He asked to be discharge with Rx for his home Percocet 10/325 mg po 4-5 times a day. Having normal BMs    Intake/Output    Intake/Output Summary (Last 24 hours) at 07/12/2020 1659  Last data filed at 07/12/2020 0959  Gross per 24 hour   Intake 600 ml   Output 1350 ml   Net -750 ml        Vital Signs:   Temp:  [35 C (95 F)-36.3 C (97.3 F)] 36.3 C (97.3 F)  Heart Rate:  [68-91] 91  Resp:  [18] 18  BP: (95-114)/(54-67) 114/67       Physical Exam:    General: thin, NAD  HEENT: MMM  Cardiovascular: S1,S2, reg  Pulmonary: CTA b/l post CW  Gastrointestinal: benign  Skin: warm  Psych:mood and affect improved  Neuro:grossly normal  OMB:TDHRCBU normal     Data:    Recent Labs   Lab 07/12/20  0555 07/11/20  0441 07/10/20  0548   WBC 11.9* 10.8* 9.8*   Hemoglobin 7.3* 7.5* 7.3*   Hematocrit 21* 20* 20*   Platelets 232 219 216     Other Labs:    Recent Labs   Lab 07/12/20  0555 07/11/20  0441 07/10/20  0548   Sodium 140 142 140   Potassium 5.3* 4.3 3.8   Chloride 107 106 106   CO2 25 26 25    UN 7 6 4*   Creatinine 0.80 0.82 0.61*   Glucose 84 105* 83   Calcium 8.3* 8.2* 8.3*      X-rays the past 24 hours: CT angio chest    Result Date: 07/09/2020  No evidence for central pulmonary embolus. Enlarged pulmonary arteries likely related to pulmonary artery hypertension. Extensive mediastinal hilar and axillary lymphadenopathy. Groundglass opacity within the lungs bilaterally including a 0.6 cm groundglass nodule in the left upper lobe and areas of parenchymal changes within the left upper lobe and lower lobes bilaterally. These could relate to infection, inflammation,  scarring  or less likely malignancy. Correlation with old examinations is recommended to assure stability. If not available a 3 month follow-up chest CT could be considered. END OF IMPRESSION UR Imaging submits this DICOM format image data and final report to the [facility], an independent secure electronic health information exchange, on a reciprocally searchable basis (with patient authorization) for a minimum of 12 months after exam date.    *Chest STANDARD single view    Result Date: 07/09/2020  Diffuse interstitial prominence likely related to chronic change. Superimposed pulmonary edema or pneumonia cannot be fully excluded. END OF IMPRESSION UR Imaging submits this DICOM format image data and final report to the [facility], an independent secure electronic health information exchange, on a reciprocally searchable basis (with patient authorization) for a minimum of 12 months after exam date.       Assessment/Plan  Active Hospital Problems    Diagnosis    Hypoxia      Resolved Hospital Problems   No resolved problems to display.     1. Sickle cell crisis, pain crisis resolved  -resume home regimen Percocet 10/325 mg  q4h prn  -hydroxyurea  -d/c IVF    2. HIV  -Triumeq    3. Chronic respiratory failure  -on baseline O2 2 l/min    3. DVT PPX: enoxaparin    4. Code status: full code    5. Disposition: home today  -coordinating care and complexity of discharge: 35 minutes    Dawnya Grams, MD   07/12/2020   4:59 PM

## 2020-07-12 NOTE — Discharge Instructions (Signed)
Brief Summary of Your Hospital Course (including key procedures and diagnostic test results):  You were admitted to the hospital for treatment of vaso-occlusive crisis. You received pain medications and intravenous fluids with improvement. Your pain is improved and you are stable for discharge home.     Your instructions:  Take all medications as prescribed according to the attached medication list.  Follow up with your primary care provider within 1 week following discharge for routine hospital follow up.      New Medications:  None     Changes to existing medications:  None    What to do after you leave the hospital:    Recommended diet: regular diet     Recommended activity: activity as tolerated    Wound Care: none needed    If you experience any of these symptoms within the first 24 hours after discharge:Pain, Uncontrolled pain, Chest pain, Shortness of breath, Fever of 101 F. or greater, Vomiting, Nausea, Diarrhea, Blood in stool, or Weakness please follow up with the discharge attending Dr. Asa Saunas at phone-number: [phone].    If you experience any of the above symptoms 24 hours or more after discharge please follow up with your primary care doctor.

## 2020-07-22 NOTE — Progress Notes (Signed)
Pharmacist Discharge Counseling     Discharge medications per the Current Discharge Medication List in the After Visit Summary were reviewed with patient.  We discussed indications, side effects, doses, frequencies, handling missed doses, and potential interactions.      Patient Understanding of Medication Therapy: Patient demonstrated understanding of medication therapy.     Additional Recommendations or Clarifications: Patient picked up med from [facility] outpt pharmacy for total $0 copay.      MEDICATIONS: New, Changed, or Stopped:       Medication List      STOP taking these medications    oxyCODONE-acetaminophen 10-325 mg per tablet  Commonly known as: PERCOCET        Start:   oxyCODONE-acetaminophen 10-325 mg per tablet  Commonly known as: PERCOCET  Take 1 tablet by mouth every 4 hours as needed for Pain for up to 5 days  Max daily dose: 6 tablets           Where to Get Your Medications      These medications were sent to [facility]  [address] [place] [address]    Hours: Mon-Fri 9A-5:30P, Sat-Sun 10A-2P Phone: [phone]    oxyCODONE-acetaminophen 10-325 mg per tablet       Chauncey Bruno, Pharm.D, BCPS  Transitions of Care Pharmacy Specialist  Phone: [phone]  Pager [phone]

## 2020-07-24 ENCOUNTER — Telehealth: Payer: Self-pay

## 2020-07-24 ENCOUNTER — Other Ambulatory Visit: Payer: Self-pay | Admitting: Family Medicine

## 2020-07-24 DIAGNOSIS — D571 Sickle-cell disease without crisis: Secondary | ICD-10-CM

## 2020-07-24 DIAGNOSIS — G8929 Other chronic pain: Secondary | ICD-10-CM

## 2020-07-24 MED ORDER — OXYCODONE-ACETAMINOPHEN 10-325 MG PO TABS: 1.0000 | ORAL_TABLET | ORAL | 0 refills | Status: DC | PRN

## 2020-07-24 NOTE — Progress Notes (Signed)
Reviewed PDMP substance reporting system prior to prescribing opiate medications. No inconsistencies noted.   Meds ordered this encounter  Medications  . oxyCODONE-acetaminophen (PERCOCET) 10-325 MG tablet    Sig: Take 1 tablet by mouth every 4 (four) hours as needed for up to 15 days for pain.    Dispense:  90 tablet    Refill:  0    Please do not fill this medication prior to 06/28/2020. Thank you.    Order Specific Question:   Supervising Provider    Answer:   Quentin Angst [[id number]]    [name]  APRN, [name], [name] Patient [facility] [address] [place], [place] [address] [phone]

## 2020-07-24 NOTE — Telephone Encounter (Signed)
Percocet med refill

## 2020-07-31 ENCOUNTER — Ambulatory Visit: Payer: Medicaid Other | Admitting: Family Medicine

## 2020-08-01 NOTE — Telephone Encounter (Signed)
Error

## 2020-08-02 ENCOUNTER — Ambulatory Visit: Payer: Medicaid Other | Admitting: Family Medicine

## 2020-08-06 ENCOUNTER — Telehealth: Payer: Self-pay

## 2020-08-06 ENCOUNTER — Other Ambulatory Visit: Payer: Self-pay | Admitting: Family Medicine

## 2020-08-06 DIAGNOSIS — D571 Sickle-cell disease without crisis: Secondary | ICD-10-CM

## 2020-08-06 DIAGNOSIS — G8929 Other chronic pain: Secondary | ICD-10-CM

## 2020-08-06 MED ORDER — OXYCODONE-ACETAMINOPHEN 10-325 MG PO TABS: 1.0000 | ORAL_TABLET | ORAL | 0 refills | Status: DC | PRN

## 2020-08-06 NOTE — Telephone Encounter (Signed)
Med refill percocet

## 2020-08-06 NOTE — Progress Notes (Signed)
Reviewed PDMP substance reporting system prior to prescribing opiate medications. No inconsistencies noted.   Meds ordered this encounter  Medications  . oxyCODONE-acetaminophen (PERCOCET) 10-325 MG tablet    Sig: Take 1 tablet by mouth every 4 (four) hours as needed for up to 15 days for pain.    Dispense:  90 tablet    Refill:  0    Please do not fill this medication prior to 06/28/2020. Thank you.    Order Specific Question:   Supervising Provider    Answer:   JEGEDE, OLUGBEMIGA E [[id number]]    Martin Corkern Moore Caralynn Gelber  APRN, [name], [name] [facility] [address]  [place], [place] [address] [phone]  

## 2020-08-13 ENCOUNTER — Telehealth: Payer: Medicaid Other | Admitting: Family Medicine

## 2020-08-20 ENCOUNTER — Telehealth: Payer: Self-pay

## 2020-08-20 NOTE — Telephone Encounter (Signed)
Med refill Percocet and zytec for allergies

## 2020-08-21 ENCOUNTER — Other Ambulatory Visit: Payer: Self-pay | Admitting: Family Medicine

## 2020-08-21 DIAGNOSIS — D571 Sickle-cell disease without crisis: Secondary | ICD-10-CM

## 2020-08-21 DIAGNOSIS — G8929 Other chronic pain: Secondary | ICD-10-CM

## 2020-08-21 MED ORDER — CETIRIZINE HCL 10 MG PO TABS: 10.0000 mg | ORAL_TABLET | Freq: Every day | ORAL | 2 refills | Status: DC

## 2020-08-21 MED ORDER — OXYCODONE-ACETAMINOPHEN 10-325 MG PO TABS: 1.0000 | ORAL_TABLET | ORAL | 0 refills | Status: DC | PRN

## 2020-08-21 NOTE — Progress Notes (Signed)
Reviewed PDMP substance reporting system prior to prescribing opiate medications. No inconsistencies noted.   Meds ordered this encounter  Medications  . oxyCODONE-acetaminophen (PERCOCET) 10-325 MG tablet    Sig: Take 1 tablet by mouth every 4 (four) hours as needed for up to 15 days for pain.    Dispense:  90 tablet    Refill:  0    Please do not fill this medication prior to 06/28/2020. Thank you.    Order Specific Question:   Supervising Provider    Answer:   JEGEDE, OLUGBEMIGA E [[id number]]    Martin Klinck Moore Elliett Guarisco  APRN, [name], [name] [facility] [address]  [place], [place] [address] [phone]  

## 2020-08-26 ENCOUNTER — Emergency Department (HOSPITAL_COMMUNITY): Payer: Medicaid Other

## 2020-08-26 ENCOUNTER — Other Ambulatory Visit: Payer: Self-pay

## 2020-08-26 ENCOUNTER — Inpatient Hospital Stay (HOSPITAL_COMMUNITY)
Admission: EM | Admit: 2020-08-26 | Discharge: 2020-08-26 | DRG: 811 | Discharge status: Against Medical Advice | Payer: Medicaid Other | Attending: Internal Medicine | Admitting: Internal Medicine

## 2020-08-26 ENCOUNTER — Encounter (HOSPITAL_COMMUNITY): Payer: Self-pay

## 2020-08-26 DIAGNOSIS — Z20822 Contact with and (suspected) exposure to covid-19: Secondary | ICD-10-CM | POA: Diagnosis present

## 2020-08-26 DIAGNOSIS — B20 Human immunodeficiency virus [HIV] disease: Secondary | ICD-10-CM | POA: Diagnosis present

## 2020-08-26 DIAGNOSIS — Z7901 Long term (current) use of anticoagulants: Secondary | ICD-10-CM | POA: Diagnosis not present

## 2020-08-26 DIAGNOSIS — J302 Other seasonal allergic rhinitis: Secondary | ICD-10-CM | POA: Diagnosis present

## 2020-08-26 DIAGNOSIS — Z832 Family history of diseases of the blood and blood-forming organs and certain disorders involving the immune mechanism: Secondary | ICD-10-CM | POA: Diagnosis not present

## 2020-08-26 DIAGNOSIS — F411 Generalized anxiety disorder: Secondary | ICD-10-CM | POA: Diagnosis present

## 2020-08-26 DIAGNOSIS — Z86711 Personal history of pulmonary embolism: Secondary | ICD-10-CM | POA: Diagnosis present

## 2020-08-26 DIAGNOSIS — D638 Anemia in other chronic diseases classified elsewhere: Secondary | ICD-10-CM | POA: Diagnosis present

## 2020-08-26 DIAGNOSIS — J9621 Acute and chronic respiratory failure with hypoxia: Secondary | ICD-10-CM | POA: Diagnosis present

## 2020-08-26 DIAGNOSIS — J9610 Chronic respiratory failure, unspecified whether with hypoxia or hypercapnia: Secondary | ICD-10-CM | POA: Diagnosis present

## 2020-08-26 DIAGNOSIS — E559 Vitamin D deficiency, unspecified: Secondary | ICD-10-CM | POA: Diagnosis present

## 2020-08-26 DIAGNOSIS — Z8616 Personal history of COVID-19: Secondary | ICD-10-CM

## 2020-08-26 DIAGNOSIS — Z21 Asymptomatic human immunodeficiency virus [HIV] infection status: Secondary | ICD-10-CM | POA: Diagnosis present

## 2020-08-26 DIAGNOSIS — Z886 Allergy status to analgesic agent status: Secondary | ICD-10-CM

## 2020-08-26 DIAGNOSIS — J9620 Acute and chronic respiratory failure, unspecified whether with hypoxia or hypercapnia: Secondary | ICD-10-CM | POA: Diagnosis present

## 2020-08-26 DIAGNOSIS — Z91048 Other nonmedicinal substance allergy status: Secondary | ICD-10-CM | POA: Diagnosis not present

## 2020-08-26 DIAGNOSIS — Z9981 Dependence on supplemental oxygen: Secondary | ICD-10-CM

## 2020-08-26 DIAGNOSIS — K219 Gastro-esophageal reflux disease without esophagitis: Secondary | ICD-10-CM | POA: Diagnosis present

## 2020-08-26 DIAGNOSIS — D5701 Hb-SS disease with acute chest syndrome: Secondary | ICD-10-CM | POA: Diagnosis present

## 2020-08-26 DIAGNOSIS — F1190 Opioid use, unspecified, uncomplicated: Secondary | ICD-10-CM | POA: Diagnosis present

## 2020-08-26 DIAGNOSIS — G894 Chronic pain syndrome: Secondary | ICD-10-CM | POA: Diagnosis present

## 2020-08-26 DIAGNOSIS — Z888 Allergy status to other drugs, medicaments and biological substances status: Secondary | ICD-10-CM

## 2020-08-26 DIAGNOSIS — D5700 Hb-SS disease with crisis, unspecified: Secondary | ICD-10-CM | POA: Diagnosis present

## 2020-08-26 DIAGNOSIS — Z86718 Personal history of other venous thrombosis and embolism: Secondary | ICD-10-CM

## 2020-08-26 DIAGNOSIS — Z79899 Other long term (current) drug therapy: Secondary | ICD-10-CM | POA: Diagnosis not present

## 2020-08-26 DIAGNOSIS — Z79891 Long term (current) use of opiate analgesic: Secondary | ICD-10-CM

## 2020-08-26 LAB — CBC WITH DIFFERENTIAL/PLATELET
Abs Immature Granulocytes: 0.01 10*3/uL (ref 0.00–0.07)
Basophils Absolute: 0 10*3/uL (ref 0.0–0.1)
Basophils Relative: 0 %
Eosinophils Absolute: 0.1 10*3/uL (ref 0.0–0.5)
Eosinophils Relative: 1 %
HCT: 11 % — ABNORMAL LOW (ref 39.0–52.0)
Hemoglobin: 8.2 g/dL — ABNORMAL LOW (ref 13.0–17.0)
Immature Granulocytes: 0 %
Lymphocytes Relative: 71 %
Lymphs Abs: 6.5 10*3/uL — ABNORMAL HIGH (ref 0.7–4.0)
MCH: 73.9 pg — ABNORMAL HIGH (ref 26.0–34.0)
MCHC: 74.5 g/dL — ABNORMAL HIGH (ref 30.0–36.0)
MCV: 99.1 fL (ref 80.0–100.0)
Monocytes Absolute: 0.7 10*3/uL (ref 0.1–1.0)
Monocytes Relative: 7 %
Neutro Abs: 2 10*3/uL (ref 1.7–7.7)
Neutrophils Relative %: 21 %
Platelets: 322 10*3/uL (ref 150–400)
RBC: 1.11 MIL/uL — ABNORMAL LOW (ref 4.22–5.81)
RDW: 33.1 % — ABNORMAL HIGH (ref 11.5–15.5)
WBC: 9.3 10*3/uL (ref 4.0–10.5)
nRBC: 1 % — ABNORMAL HIGH (ref 0.0–0.2)

## 2020-08-26 LAB — TYPE AND SCREEN
ABO/RH(D): O POS
Antibody Screen: NEGATIVE

## 2020-08-26 LAB — COMPREHENSIVE METABOLIC PANEL
ALT: 15 U/L (ref 0–44)
AST: 33 U/L (ref 15–41)
Albumin: 3.7 g/dL (ref 3.5–5.0)
Alkaline Phosphatase: 57 U/L (ref 38–126)
Anion gap: 6 (ref 5–15)
BUN: 7 mg/dL (ref 6–20)
CO2: 26 mmol/L (ref 22–32)
Calcium: 8.8 mg/dL — ABNORMAL LOW (ref 8.9–10.3)
Chloride: 107 mmol/L (ref 98–111)
Creatinine, Ser: 0.73 mg/dL (ref 0.61–1.24)
GFR, Estimated: >60 mL/min (ref >60)
Glucose, Bld: 87 mg/dL (ref 70–99)
Potassium: 3.8 mmol/L (ref 3.5–5.1)
Sodium: 139 mmol/L (ref 135–145)
Total Bilirubin: 5.3 mg/dL — ABNORMAL HIGH (ref 0.3–1.2)
Total Protein: 8.5 g/dL — ABNORMAL HIGH (ref 6.5–8.1)

## 2020-08-26 LAB — RETICULOCYTES
Immature Retic Fract: 32.3 % — ABNORMAL HIGH (ref 2.3–15.9)
RBC.: 2.33 MIL/uL — ABNORMAL LOW (ref 4.22–5.81)
Retic Count, Absolute: 572 10*3/uL — ABNORMAL HIGH (ref 19.0–186.0)
Retic Ct Pct: 23.3 % — ABNORMAL HIGH (ref 0.4–3.1)

## 2020-08-26 LAB — RESP PANEL BY RT-PCR (FLU A&B, COVID) ARPGX2
Influenza A by PCR: NEGATIVE
Influenza B by PCR: NEGATIVE
SARS Coronavirus 2 by RT PCR: NEGATIVE

## 2020-08-26 MED ORDER — VITAMIN D (ERGOCALCIFEROL) 1.25 MG (50000 UNIT) PO CAPS
50000.0000 [IU] | ORAL_CAPSULE | ORAL | Status: DC
  Filled 2020-08-26: qty 1

## 2020-08-26 MED ORDER — HYDROMORPHONE 1 MG/ML IV SOLN
INTRAVENOUS | Status: DC
Start: 2020-08-26 — End: 2020-08-26
  Administered 2020-08-26: 30 mg via INTRAVENOUS
  Administered 2020-08-26: 4 mg via INTRAVENOUS
  Filled 2020-08-26: qty 30

## 2020-08-26 MED ORDER — POLYETHYLENE GLYCOL 3350 17 G PO PACK: 17.0000 g | PACK | Freq: Every day | ORAL | Status: DC | PRN

## 2020-08-26 MED ORDER — DIPHENHYDRAMINE HCL 25 MG PO CAPS
25.0000 mg | ORAL_CAPSULE | ORAL | Status: DC | PRN
  Administered 2020-08-26: 25 mg via ORAL
  Filled 2020-08-26: qty 1

## 2020-08-26 MED ORDER — ADULT MULTIVITAMIN W/MINERALS CH: 1.0000 | ORAL_TABLET | Freq: Every day | ORAL | Status: DC

## 2020-08-26 MED ORDER — SODIUM CHLORIDE 0.9 % IV SOLN
25.0000 mg | INTRAVENOUS | Status: DC | PRN
  Filled 2020-08-26: qty 0.5

## 2020-08-26 MED ORDER — ONDANSETRON HCL 4 MG/2ML IJ SOLN
4.0000 mg | INTRAMUSCULAR | Status: DC | PRN
  Administered 2020-08-26: 4 mg via INTRAVENOUS
  Filled 2020-08-26: qty 2

## 2020-08-26 MED ORDER — HYDROXYUREA 500 MG PO CAPS: 2000.0000 mg | ORAL_CAPSULE | Freq: Every day | ORAL | Status: DC

## 2020-08-26 MED ORDER — SODIUM CHLORIDE 0.9 % IV SOLN: 1.0000 g | INTRAVENOUS | Status: DC

## 2020-08-26 MED ORDER — HEPARIN SOD (PORK) LOCK FLUSH 100 UNIT/ML IV SOLN
250.0000 [IU] | Freq: Every day | INTRAVENOUS | Status: DC
  Administered 2020-08-26: 250 [IU]
  Filled 2020-08-26: qty 5

## 2020-08-26 MED ORDER — SODIUM CHLORIDE 0.9 % IV SOLN: 500.0000 mg | INTRAVENOUS | Status: DC

## 2020-08-26 MED ORDER — LORATADINE 10 MG PO TABS
10.0000 mg | ORAL_TABLET | Freq: Every day | ORAL | Status: DC
  Administered 2020-08-26: 10 mg via ORAL
  Filled 2020-08-26: qty 1

## 2020-08-26 MED ORDER — DIPHENHYDRAMINE HCL 50 MG/ML IJ SOLN
25.0000 mg | Freq: Once | INTRAMUSCULAR | Status: AC
  Administered 2020-08-26: 25 mg via INTRAVENOUS
  Filled 2020-08-26: qty 1

## 2020-08-26 MED ORDER — SENNOSIDES-DOCUSATE SODIUM 8.6-50 MG PO TABS
1.0000 | ORAL_TABLET | Freq: Two times a day (BID) | ORAL | Status: DC
  Administered 2020-08-26: 1 via ORAL
  Filled 2020-08-26: qty 1

## 2020-08-26 MED ORDER — DEXTROSE-NACL 5-0.45 % IV SOLN: INTRAVENOUS | Status: DC

## 2020-08-26 MED ORDER — HYDROMORPHONE HCL 2 MG/ML IJ SOLN
2.0000 mg | Freq: Once | INTRAMUSCULAR | Status: AC
  Administered 2020-08-26: 2 mg via INTRAVENOUS
  Filled 2020-08-26: qty 1

## 2020-08-26 MED ORDER — ONDANSETRON HCL 4 MG/2ML IJ SOLN: 4.0000 mg | Freq: Four times a day (QID) | INTRAMUSCULAR | Status: DC | PRN

## 2020-08-26 MED ORDER — NALOXONE HCL 0.4 MG/ML IJ SOLN: 0.4000 mg | INTRAMUSCULAR | Status: DC | PRN

## 2020-08-26 MED ORDER — ABACAVIR-DOLUTEGRAVIR-LAMIVUD 600-50-300 MG PO TABS: 1.0000 | ORAL_TABLET | Freq: Every day | ORAL | Status: DC

## 2020-08-26 MED ORDER — SODIUM CHLORIDE 0.9 % IV SOLN
500.0000 mg | Freq: Once | INTRAVENOUS | Status: AC
  Administered 2020-08-26: 500 mg via INTRAVENOUS
  Filled 2020-08-26: qty 500

## 2020-08-26 MED ORDER — SODIUM CHLORIDE 0.9% FLUSH: 9.0000 mL | INTRAVENOUS | Status: DC | PRN

## 2020-08-26 MED ORDER — HYDROMORPHONE HCL 2 MG/ML IJ SOLN
2.0000 mg | INTRAMUSCULAR | Status: AC
  Administered 2020-08-26: 2 mg via INTRAVENOUS
  Filled 2020-08-26: qty 1

## 2020-08-26 MED ORDER — APIXABAN 5 MG PO TABS
5.0000 mg | ORAL_TABLET | Freq: Two times a day (BID) | ORAL | Status: DC
  Filled 2020-08-26: qty 1

## 2020-08-26 MED ORDER — HEPARIN SOD (PORK) LOCK FLUSH 100 UNIT/ML IV SOLN: 250.0000 [IU] | INTRAVENOUS | Status: DC | PRN

## 2020-08-26 MED ORDER — SODIUM CHLORIDE 0.9 % IV SOLN
1.0000 g | Freq: Once | INTRAVENOUS | Status: AC
  Administered 2020-08-26: 1 g via INTRAVENOUS
  Filled 2020-08-26: qty 10

## 2020-08-26 NOTE — ED Provider Notes (Signed)
Care assumed from PA Mia McDonald, please see her note for full details, but in brief Martin [name] is a 30 y.o. male with a history of sickle cell anemia who presents with generalized pain typical for his sickle cell crisis for the past 24 hours.  Also noted to be hypoxic on arrival.  Does report that he typically wears oxygen at home, has had some chest discomfort, denies fever or cough.  Work-up thus far has been significant for chest x-ray concerning for acute chest syndrome with bilateral basilar opacities.  Given this patient will require admission, will start antibiotics and collect blood cultures and COVID/flu testing.  BP 110/79   Pulse 70   Temp 98.4 F (36.9 C) (Oral)   Resp 14   Ht 6\' 3"  (1.905 m)   Wt 68 kg   SpO2 92%   BMI 18.75 kg/m     ED Course/Procedures   Labs Reviewed  CBC WITH DIFFERENTIAL/PLATELET - Abnormal; Notable for the following components:      Result Value   RBC 1.11 (*)    Hemoglobin 8.2 (*)    HCT 11.0 (*)    MCH 73.9 (*)    MCHC 74.5 (*)    RDW 33.1 (*)    nRBC 1.0 (*)    Lymphs Abs 6.5 (*)    All other components within normal limits  RETICULOCYTES - Abnormal; Notable for the following components:   Retic Ct Pct 23.3 (*)    RBC. 2.33 (*)    Retic Count, Absolute 572.0 (*)    Immature Retic Fract 32.3 (*)    All other components within normal limits  COMPREHENSIVE METABOLIC PANEL - Abnormal; Notable for the following components:   Calcium 8.8 (*)    Total Protein 8.5 (*)    Total Bilirubin 5.3 (*)    All other components within normal limits  RESP PANEL BY RT-PCR (FLU A&B, COVID) ARPGX2  CULTURE, BLOOD (ROUTINE X 2)  CULTURE, BLOOD (ROUTINE X 2)  TYPE AND SCREEN     Procedures  MDM   I have independently ordered, reviewed and interpreted all labs and imaging:  Labs show no leukocytosis and hemoglobin is at baseline, bili of 5.3 which is within range she has been in the past, no other significant electrolyte derangements.   Blood cultures pending.  COVID and flu testing negative.  Appropriate reticulocyte count.  Given acute chest syndrome patient will require IV antibiotics and admission.  Will consult sickle cell service for admission.  Case discussed with Dr. with sickle cell service who will see and admit the patient.       Mikeal Hawthorne, PA-C 08/26/20 0818    10/26/20, MD 08/28/20 [phone]

## 2020-08-26 NOTE — ED Notes (Signed)
Patient given sandwich and drink.  

## 2020-08-26 NOTE — Progress Notes (Signed)
Patient called this nurse into room stating that he has a "family emergency and I need to sign out AMA, I'm sorry".  I educated the patient on the risk of leaving without being discharged and against medical advice. Patient voiced understanding. AMA paper signed by patient. Dr. Mikeal Hawthorne made aware of patient intent prior to signing form.

## 2020-08-26 NOTE — ED Triage Notes (Signed)
Pt reports SCC pain worsening over the day. Pain worst in lower back and hips.

## 2020-08-26 NOTE — ED Provider Notes (Signed)
[facility]-EMERGENCY DEPT Provider Note   CSN: [id number] Arrival date & time: 08/26/20  0448     History Chief Complaint  Patient presents with  . Sickle Cell Pain Crisis    Martin [name] is a 30 y.o. male with a history of HIV, DVT and PE on Eliquis, sickle cell disease who presents the emergency department with a chief complaint of sickle cell pain crisis.  The patient reports that he developed pain similar to previous episodes of sickle cell pain crisis approximately 24 hours ago.  Pain is maximal in his lower extremities and back.  Pain is constant and worsening since onset.  No known aggravating or alleviating factors.  He took his home hydroxyurea yesterday as well as his home dose of his Percocet 10 mg at approximately midnight with no improvement in his symptoms.  He denies fever, chills, cough, chest pain, shortness of breath, nausea, vomiting, diarrhea, numbness, weakness, prolonged erection, dysuria, hematuria, urinary retention, dizziness, lightheadedness, or syncope.  No other treatment prior to arrival.  The history is provided by the patient and medical records. No language interpreter was used.       Past Medical History:  Diagnosis Date  . Anxiety   . HIV (human immunodeficiency virus infection) (HCC)   . Proteinuria   . Sickle cell crisis (HCC)   . 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HCC)   . Vitamin D deficiency 10/2018    Patient Active Problem List   Diagnosis Date Noted  . COVID-19 05/13/2020  . Pneumonia, viral 04/04/2020  . Acute and chronic respiratory failure with hypoxia (HCC) 04/03/2020  . 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/18/2020  . 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 Itching of male genitalia 02/22/2020  . Abnormal penile discharge, without blood 02/22/2020  . Leukocytosis 01/02/2020  . Anxiety 11/27/2019  . Proteinuria 11/27/2019  . Scleral icterus   . Chronic, continuous use of opioids 08/15/2019  . Seasonal allergies 08/15/2019   . Hypoxia   . Chest congestion   . Sickle cell anemia with crisis (HCC) 08/04/2019  . Other chronic pain 08/04/2019  . History of pulmonary embolus (PE) 08/04/2019  . Acute pulmonary embolism (HCC) 02/10/2019  . Acute chest syndrome due to sickle cell crisis (HCC) 02/10/2019  . Sickle cell crisis (HCC) 01/17/2019  . Heart murmur 02/03/2017  . Sickle cell disease with crisis (HCC) 02/02/2017  . 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 Sickle cell pain crisis (HCC) 02/02/2017  . 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HCC) 12/20/2016  . Vitamin D deficiency 08/14/2016  . High risk medication use 09/27/2014  . Generalized anxiety disorder 05/19/2014  . GERD (gastroesophageal reflux disease) 05/19/2014  . HIV (human immunodeficiency virus infection) (HCC) 05/23/2012    Past Surgical History:  Procedure Laterality Date  . IR IMAGING GUIDED PORT INSERTION  08/29/2019       Family History  Problem Relation Age of Onset  . Sickle cell trait Mother   . Sickle cell trait Father   . Birth defects Maternal Grandmother   . Birth defects Paternal Grandmother     Social History   Tobacco Use  . Smoking status: Never Smoker  . Smokeless tobacco: Never Used  Vaping Use  . Vaping Use: Some days  . Substances: THC  Substance Use Topics  . Alcohol use: No  . Drug use: Yes    Types: Marijuana    Home Medications Prior to Admission medications   Medication Sig Start Date End Date Taking? Authorizing Provider  abacavir-dolutegravir-lamiVUDine (  TRIUMEQ) 600-50-300 MG tablet Take 1 tablet by mouth at bedtime.     [provider]  apixaban (ELIQUIS) 5 MG TABS tablet Take 1 tablet (5 mg total) by mouth 2 (two) times daily. 08/21/19   Massie Maroon, [name]  cetirizine (ZYRTEC ALLERGY) 10 MG tablet Take 1 tablet (10 mg total) by mouth daily. 08/21/20 08/21/21  Massie Maroon, [name]  hydroxyurea (HYDREA) 500 MG capsule Take 4 capsules (2,000 mg total) by mouth at bedtime. 05/17/20   [name], [name]   Multiple Vitamin (MULTIVITAMIN WITH MINERALS) TABS tablet Take 1 tablet by mouth daily with breakfast.     [provider]  oxyCODONE-acetaminophen (PERCOCET) 10-325 MG tablet Take 1 tablet by mouth every 4 (four) hours as needed for up to 15 days for pain. 08/21/20 09/05/20  Massie Maroon, [name]  traZODone (DESYREL) 100 MG tablet Take 1 tablet (100 mg total) by mouth at bedtime as needed for sleep. 03/26/20   [name], [name]  Vitamin D, Ergocalciferol, (DRISDOL) 1.25 MG (50000 UT) CAPS capsule Take 1 capsule (50,000 Units total) by mouth every 7 (seven) days. 11/01/18   [name], [name]    Allergies    Ketorolac tromethamine, Tape, and Ketorolac  Review of Systems   Review of Systems  Constitutional: Negative for appetite change, chills and fever.  HENT: Negative for congestion and sore throat.   Eyes: Negative for visual disturbance.  Respiratory: Negative for shortness of breath.   Cardiovascular: Negative for chest pain, palpitations and leg swelling.  Gastrointestinal: Negative for abdominal pain, constipation, diarrhea, nausea and vomiting.  Genitourinary: Negative for dysuria, frequency, penile pain, penile swelling and urgency.  Musculoskeletal: Positive for arthralgias, back pain and myalgias. Negative for gait problem, joint swelling, neck pain and neck stiffness.  Skin: Negative for rash and wound.  Allergic/Immunologic: Negative for immunocompromised state.  Neurological: Negative for seizures, syncope, weakness, numbness and headaches.  Psychiatric/Behavioral: Negative for confusion.    Physical Exam Updated Vital Signs BP 110/79   Pulse 70   Temp 98.4 F (36.9 C) (Oral)   Resp 14   Ht 6\' 3"  (1.905 m)   Wt 68 kg   SpO2 92%   BMI 18.75 kg/m   Physical Exam Vitals and nursing note reviewed.  Constitutional:      Appearance: He is well-developed. He is not toxic-appearing.  HENT:     Head: Normocephalic.  Eyes:     General: Scleral icterus  present.     Conjunctiva/sclera: Conjunctivae normal.  Cardiovascular:     Rate and Rhythm: Normal rate and regular rhythm.     Pulses: Normal pulses.     Heart sounds: Normal heart sounds. No murmur heard. No friction rub. No gallop.   Pulmonary:     Effort: Pulmonary effort is normal. No respiratory distress.     Breath sounds: No stridor. No wheezing, rhonchi or rales.  Chest:     Chest wall: No tenderness.  Abdominal:     General: There is no distension.     Palpations: Abdomen is soft. There is no mass.     Tenderness: There is no abdominal tenderness. There is no right CVA tenderness, left CVA tenderness, guarding or rebound.     Hernia: No hernia is present.  Musculoskeletal:        General: No tenderness.     Cervical back: Neck supple.     Right lower leg: No edema.     Left lower leg: No  edema.     Comments: No tenderness to palpation to the spine.  No crepitus or step-offs.  Moves all 4 extremities spontaneously.  Neurovascular intact.  Full active and passive range of motion of all joints to the bilateral upper and lower extremities without overlying erythema, edema, or warmth.  Skin:    General: Skin is warm and dry.     Capillary Refill: Capillary refill takes less than 2 seconds.     Coloration: Skin is not jaundiced.  Neurological:     Mental Status: He is alert.  Psychiatric:        Behavior: Behavior normal.     ED Results / Procedures / Treatments   Labs (all labs ordered are listed, but only abnormal results are displayed) Labs Reviewed  CBC WITH DIFFERENTIAL/PLATELET - Abnormal; Notable for the following components:      Result Value   RBC 1.11 (*)    Hemoglobin 8.2 (*)    HCT 11.0 (*)    MCH 73.9 (*)    MCHC 74.5 (*)    RDW 33.1 (*)    nRBC 1.0 (*)    Lymphs Abs 6.5 (*)    All other components within normal limits  RETICULOCYTES - Abnormal; Notable for the following components:   Retic Ct Pct 23.3 (*)    RBC. 2.33 (*)    Retic Count, Absolute  572.0 (*)    Immature Retic Fract 32.3 (*)    All other components within normal limits  COMPREHENSIVE METABOLIC PANEL - Abnormal; Notable for the following components:   Calcium 8.8 (*)    Total Protein 8.5 (*)    Total Bilirubin 5.3 (*)    All other components within normal limits  RESP PANEL BY RT-PCR (FLU A&B, COVID) ARPGX2  CULTURE, BLOOD (ROUTINE X 2)  CULTURE, BLOOD (ROUTINE X 2)  TYPE AND SCREEN    EKG None  Radiology DG Chest 2 View  Result Date: 08/26/2020 CLINICAL DATA:  30 year old male with sickle cell disease and pain crisis. EXAM: CHEST - 2 VIEW COMPARISON:  Portable chest 05/16/2020 and earlier. FINDINGS: Semi upright AP and lateral views of the chest. Stable right chest power port. Lung volumes are lower than baseline. Stable mild cardiomegaly. Other mediastinal contours are within normal limits. Visualized tracheal air column is within normal limits. No pneumothorax, pulmonary edema or pleural effusion. However, there is patchy and indistinct bilateral lower lung opacity on both views. Visible spinal vertebrae appear intact and relatively normal. Cholecystectomy clips in the abdomen. Negative visible bowel gas pattern. IMPRESSION: Increased patchy and indistinct bilateral lower lung opacity suspicious for acute chest syndrome in this setting. No pleural effusion. Electronically Signed   By: Odessa Fleming M.D.   On: 08/26/2020 06:22    Procedures .Critical Care Performed by: Barkley Boards, PA-C Authorized by: Barkley Boards, PA-C   Critical care provider statement:    Critical care time (minutes):  40   Critical care time was exclusive of:  Separately billable procedures and treating other patients and teaching time   Critical care was necessary to treat or prevent imminent or life-threatening deterioration of the following conditions:  Respiratory failure   Critical care was time spent personally by me on the following activities:  Ordering and performing treatments and  interventions, ordering and review of laboratory studies, ordering and review of radiographic studies, pulse oximetry, re-evaluation of patient's condition, review of old charts, obtaining history from patient or surrogate, examination of patient, evaluation of patient's response  to treatment and development of treatment plan with patient or surrogate   I assumed direction of critical care for this patient from another provider in my specialty: no       Medications Ordered in ED Medications  ondansetron (ZOFRAN) injection 4 mg (4 mg Intravenous Given 08/26/20 0541)  azithromycin (ZITHROMAX) 500 mg in sodium chloride 0.9 % 250 mL IVPB (500 mg Intravenous New Bag/Given 08/26/20 0726)  diphenhydrAMINE (BENADRYL) injection 25 mg (25 mg Intravenous Given 08/26/20 0543)  HYDROmorphone (DILAUDID) injection 2 mg (2 mg Intravenous Given 08/26/20 0545)  HYDROmorphone (DILAUDID) injection 2 mg (2 mg Intravenous Given 08/26/20 0623)  cefTRIAXone (ROCEPHIN) 1 g in sodium chloride 0.9 % 100 mL IVPB (0 g Intravenous Stopped 08/26/20 0723)    ED Course  I have reviewed the triage vital signs and the nursing notes.  Pertinent labs & imaging results that were available during my care of the patient were reviewed by me and considered in my medical decision making (see chart for details).    MDM Rules/Calculators/A&P                          30 year old male with a history of HIV, DVT and PE on Eliquis, sickle cell disease who presents to the emergency department with a chief complaint of sickle cell pain crisis.  Patient endorses back pain and lower extremity pain for the last 24 hours.  On arrival, patient was noted to be hypoxic at 84% on room air.  Vital signs are otherwise unremarkable.  Patient does have a history of PE, but reports compliance with his home Eliquis.  Labs and imaging of been reviewed and independently interpreted by me.  Chest x-ray with increased patchy and indistinct bilateral lower lung  opacity that is suspicious for acute chest syndrome.  Labs are otherwise pending.  Patient care transferred to PA Ford at the end of my shift to follow-up on remaining labs and likely admission given concern for acute chest syndrome. Patient presentation, ED course, and plan of care discussed with review of all pertinent labs and imaging. Please see his/her note for further details regarding further ED course and disposition.  Final Clinical Impression(s) / ED Diagnoses Final diagnoses:  None    Rx / DC Orders ED Discharge Orders    None       Lanyiah Brix A, PA-C 08/26/20 0754    Benjiman CorePickering, Nathan, MD 08/27/20 [phone]

## 2020-08-26 NOTE — H&P (Signed)
Martin [name] is an 30 y.o. male.    Chief Complaint: Pain in legs and back for 2 days  HPI: Patient uses oxygen at home and has been having shortness of breath.  He has history of HIV disease sickle cell disease as well as chronic pain syndrome.  He reports pain in his lower back legs and arms.  This is typical of his sickle cell crisis.  Patient has taken his home regimen including his oral medication but no relief.  Came to the ER where he was found to be hypoxic with oxygen sats in the 80s.  Also treated with IV Dilaudid pushes with no significant relief in the ER.  He wears 2 L of oxygen but currently on 2 L he is marginally low 90s.  Chest x-ray suggested acute chest syndrome so patient is being admitted to the hospital for further evaluation and treatment.  Patient denies nausea vomiting or diarrhea.  Patient will be admitted for management of acute chest syndrome as well as sickle cell crisis  Past Medical History:  Diagnosis Date  . Anxiety   . HIV (human immunodeficiency virus infection) (HCC)   . Proteinuria   . Sickle cell crisis (HCC)   . 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HCC)   . Vitamin D deficiency 10/2018    Past Surgical History:  Procedure Laterality Date  . IR IMAGING GUIDED PORT INSERTION  08/29/2019    Family History  Problem Relation Age of Onset  . Sickle cell trait Mother   . Sickle cell trait Father   . Birth defects Maternal Grandmother   . Birth defects Paternal Grandmother    Social History:  reports that he has never smoked. He has never used smokeless tobacco. He reports current drug use. Drug: Marijuana. He reports that he does not drink alcohol.  Allergies:  Allergies  Allergen Reactions  . Ketorolac Tromethamine Swelling  . Tape Rash and Other (See Comments)    PLEASE DO NOT USE THE CLEAR, THICK, "PLASTIC" TAPE- only paper tape is tolerated   . Ketorolac Swelling and Other (See Comments)    Patient reports facial edema and left arm edema after  administration.    (Not in a hospital admission)   Results for orders placed or performed during the hospital encounter of 08/26/20 (from the past 48 hour(s))  CBC WITH DIFFERENTIAL     Status: Abnormal   Collection Time: 08/26/20  5:24 AM  Result Value Ref Range   WBC 9.3 4.0 - 10.5 K/uL   RBC 1.11 (L) 4.22 - 5.81 MIL/uL   Hemoglobin 8.2 (L) 13.0 - 17.0 g/dL   HCT 03.7 (L) 04.8 - 88.9 %   MCV 99.1 80.0 - 100.0 fL   MCH 73.9 (H) 26.0 - 34.0 pg   MCHC 74.5 (H) 30.0 - 36.0 g/dL   RDW 16.9 (H) 45.0 - 38.8 %   Platelets 322 150 - 400 K/uL   nRBC 1.0 (H) 0.0 - 0.2 %   Neutrophils Relative % 21 %   Neutro Abs 2.0 1.7 - 7.7 K/uL   Lymphocytes Relative 71 %   Lymphs Abs 6.5 (H) 0.7 - 4.0 K/uL   Monocytes Relative 7 %   Monocytes Absolute 0.7 0.1 - 1.0 K/uL   Eosinophils Relative 1 %   Eosinophils Absolute 0.1 0.0 - 0.5 K/uL   Basophils Relative 0 %   Basophils Absolute 0.0 0.0 - 0.1 K/uL   Immature Granulocytes 0 %   Abs Immature Granulocytes 0.01 0.00 -  0.07 K/uL   Agglutination PRESENT    Polychromasia PRESENT    Sickle Cells MARKED    Target Cells PRESENT     Comment: Performed at [facility], [address], [place], [place] [address]  Reticulocytes     Status: Abnormal   Collection Time: 08/26/20  5:24 AM  Result Value Ref Range   Retic Ct Pct 23.3 (H) 0.4 - 3.1 %   RBC. 2.33 (L) 4.22 - 5.81 MIL/uL   Retic Count, Absolute 572.0 (H) 19.0 - 186.0 K/uL   Immature Retic Fract 32.3 (H) 2.3 - 15.9 %    Comment: RESULTS VERIFIED BY MANUAL DILUTION Performed at [facility], [address] [place], [place] [address]   Comprehensive metabolic panel     Status: Abnormal   Collection Time: 08/26/20  5:24 AM  Result Value Ref Range   Sodium 139 135 - 145 mmol/L   Potassium 3.8 3.5 - 5.1 mmol/L   Chloride 107 98 - 111 mmol/L   CO2 26 22 - 32 mmol/L   Glucose, Bld 87 70 - 99 mg/dL    Comment: Glucose reference range applies only to samples  taken after fasting for at least 8 hours.   BUN 7 6 - 20 mg/dL   Creatinine, Ser 6.57 0.61 - 1.24 mg/dL   Calcium 8.8 (L) 8.9 - 10.3 mg/dL   Total Protein 8.5 (H) 6.5 - 8.1 g/dL   Albumin 3.7 3.5 - 5.0 g/dL   AST 33 15 - 41 U/L   ALT 15 0 - 44 U/L   Alkaline Phosphatase 57 38 - 126 U/L   Total Bilirubin 5.3 (H) 0.3 - 1.2 mg/dL   GFR, Estimated >84 >69 mL/min    Comment: (NOTE) Calculated using the CKD-EPI Creatinine Equation (2021)    Anion gap 6 5 - 15    Comment: Performed at [facility], [address] [place], [place] [address]  Type and screen [facility]     Status: None   Collection Time: 08/26/20  5:24 AM  Result Value Ref Range   ABO/RH(D) O POS    Antibody Screen NEG    Sample Expiration      08/29/2020,2359 Performed at [facility], [address], [place], [place] [address]   Resp Panel by RT-PCR (Flu A&B, Covid) Nasopharyngeal Swab     Status: None   Collection Time: 08/26/20  6:39 AM   Specimen: Nasopharyngeal Swab; Nasopharyngeal(NP) swabs in vial transport medium  Result Value Ref Range   SARS Coronavirus 2 by RT PCR NEGATIVE NEGATIVE    Comment: (NOTE) SARS-CoV-2 target nucleic acids are NOT DETECTED.  The SARS-CoV-2 RNA is generally detectable in upper respiratory specimens during the acute phase of infection. The lowest concentration of SARS-CoV-2 viral copies this assay can detect is 138 copies/mL. A negative result does not preclude SARS-Cov-2 infection and should not be used as the sole basis for treatment or other patient management decisions. A negative result may occur with  improper specimen collection/handling, submission of specimen other than nasopharyngeal swab, presence of viral mutation(s) within the areas targeted by this assay, and inadequate number of viral copies(<138 copies/mL). A negative result must be combined with clinical observations, patient history, and  epidemiological information. The expected result is Negative.  Fact Sheet for Patients:  BloggerCourse.com  Fact Sheet for Healthcare Providers:  SeriousBroker.it  This test is no t yet approved or cleared by the [place] FDA  and  has been authorized for detection and/or diagnosis of SARS-CoV-2 by FDA under an Emergency Use Authorization (EUA). This EUA will remain  in effect (meaning this test can be used) for the duration of the COVID-19 declaration under Section 564(b)(1) of the Act, 21 U.S.C.section 360bbb-3(b)(1), unless the authorization is terminated  or revoked sooner.       Influenza A by PCR NEGATIVE NEGATIVE   Influenza B by PCR NEGATIVE NEGATIVE    Comment: (NOTE) The Xpert Xpress SARS-CoV-2/FLU/RSV plus assay is intended as an aid in the diagnosis of influenza from Nasopharyngeal swab specimens and should not be used as a sole basis for treatment. Nasal washings and aspirates are unacceptable for Xpert Xpress SARS-CoV-2/FLU/RSV testing.  Fact Sheet for Patients: BloggerCourse.com  Fact Sheet for Healthcare Providers: SeriousBroker.it  This test is not yet approved or cleared by the [place] FDA and has been authorized for detection and/or diagnosis of SARS-CoV-2 by FDA under an Emergency Use Authorization (EUA). This EUA will remain in effect (meaning this test can be used) for the duration of the COVID-19 declaration under Section 564(b)(1) of the Act, 21 U.S.C. section 360bbb-3(b)(1), unless the authorization is terminated or revoked.  Performed at [facility], [address] [place], [place] [address]    DG Chest 2 View  Result Date: 08/26/2020 CLINICAL DATA:  30 year old male with sickle cell disease and pain crisis. EXAM: CHEST - 2 VIEW COMPARISON:  Portable chest 05/16/2020 and earlier. FINDINGS: Semi upright AP and lateral  views of the chest. Stable right chest power port. Lung volumes are lower than baseline. Stable mild cardiomegaly. Other mediastinal contours are within normal limits. Visualized tracheal air column is within normal limits. No pneumothorax, pulmonary edema or pleural effusion. However, there is patchy and indistinct bilateral lower lung opacity on both views. Visible spinal vertebrae appear intact and relatively normal. Cholecystectomy clips in the abdomen. Negative visible bowel gas pattern. IMPRESSION: Increased patchy and indistinct bilateral lower lung opacity suspicious for acute chest syndrome in this setting. No pleural effusion. Electronically Signed   By: Odessa Fleming M.D.   On: 08/26/2020 06:22    Review of Systems  Constitutional: Positive for fatigue. Negative for fever.  HENT: Negative.   Eyes: Negative.   Respiratory: Positive for cough and shortness of breath.   Cardiovascular: Negative.   Gastrointestinal: Negative.   Endocrine: Negative.   Genitourinary: Negative.   Musculoskeletal: Negative.   Neurological: Negative.   Hematological: Negative.   Psychiatric/Behavioral: Negative.     Blood pressure 101/68, pulse 85, temperature 98.4 F (36.9 C), temperature source Oral, resp. rate 14, height 6\' 3"  (1.905 m), weight 68 kg, SpO2 93 %. Physical Exam Constitutional:      Appearance: He is ill-appearing.  HENT:     Head: Normocephalic and atraumatic.     Right Ear: Tympanic membrane normal.     Left Ear: Tympanic membrane normal.     Nose: Nose normal.     Mouth/Throat:     Mouth: Mucous membranes are moist.  Eyes:     Extraocular Movements: Extraocular movements intact.     Pupils: Pupils are equal, round, and reactive to light.  Cardiovascular:     Rate and Rhythm: Normal rate and regular rhythm.     Pulses: Normal pulses.     Heart sounds: Normal heart sounds.  Abdominal:     General: Bowel sounds are normal.     Palpations: Abdomen is soft.  Musculoskeletal:  General: Normal range of motion.     Cervical back: Normal range of motion and neck supple.  Skin:    General: Skin is warm.  Neurological:     General: No focal deficit present.     Mental Status: He is alert. Mental status is at baseline. He is disoriented.  Psychiatric:        Mood and Affect: Mood normal.        Behavior: Behavior normal.      Assessment/Plan 30 year old gentleman with history of sickle cell disease HIV disease here with acute chest syndrome.  #1 acute chest syndrome: Patient is afebrile.  Hypoxic with a chest x-ray showing possible infiltrates.  Initiate empiric IV antibiotics with Rocephin and Zithromax.  Placed on oxygen and admitted.  #2 sickle cell painful crisis: Initiate Dilaudid PCA with IV fluids.  We will continue oral oxycodone most likely.  Patient has allergy to Toradol so we will not use it.  #3 asymptomatic HIV disease: Continue with anti-HIV medication.  #4 history of pulmonary embolism: On chronic anticoagulation.  We will continue  #5 anemia of chronic disease: Hemoglobin so far stable.  Continue monitor  Devon Kingdon,LAWAL, MD 08/26/2020, 8:21 AM

## 2020-08-27 ENCOUNTER — Ambulatory Visit: Payer: Medicaid Other | Admitting: Family Medicine

## 2020-08-27 LAB — BLOOD CULTURE ID PANEL (REFLEXED) - BCID2
A.calcoaceticus-baumannii: NOT DETECTED
Bacteroides fragilis: NOT DETECTED
Candida albicans: NOT DETECTED
Candida auris: NOT DETECTED
Candida glabrata: NOT DETECTED
Candida krusei: NOT DETECTED
Candida parapsilosis: NOT DETECTED
Candida tropicalis: NOT DETECTED
Cryptococcus neoformans/gattii: NOT DETECTED
Enterobacter cloacae complex: NOT DETECTED
Enterobacterales: NOT DETECTED
Enterococcus Faecium: NOT DETECTED
Enterococcus faecalis: DETECTED — AB
Escherichia coli: NOT DETECTED
Haemophilus influenzae: NOT DETECTED
Klebsiella aerogenes: NOT DETECTED
Klebsiella oxytoca: NOT DETECTED
Klebsiella pneumoniae: NOT DETECTED
Listeria monocytogenes: NOT DETECTED
Meth resistant mecA/C and MREJ: NOT DETECTED
Neisseria meningitidis: NOT DETECTED
Proteus species: NOT DETECTED
Pseudomonas aeruginosa: NOT DETECTED
Salmonella species: NOT DETECTED
Serratia marcescens: NOT DETECTED
Staphylococcus aureus (BCID): NOT DETECTED
Staphylococcus epidermidis: NOT DETECTED
Staphylococcus lugdunensis: NOT DETECTED
Staphylococcus species: NOT DETECTED
Stenotrophomonas maltophilia: NOT DETECTED
Streptococcus agalactiae: NOT DETECTED
Streptococcus pneumoniae: NOT DETECTED
Streptococcus pyogenes: NOT DETECTED
Streptococcus species: NOT DETECTED
Vancomycin resistance: NOT DETECTED

## 2020-08-27 NOTE — Progress Notes (Signed)
   08/27/20 1200  Provider Notification  Provider Name/Title Dr. Hyman Hopes  Date Provider Notified 08/27/20  Time Provider Notified 1200  Notification Type Call  Notification Reason Critical result  Test performed and critical result Blood cultures also growing staph aureas  Date Critical Result Received 08/27/20  Time Critical Result Received 1158  Provider response Other (Comment) (RN to call patient to come back to ED)  Date of Provider Response 08/27/20  Time of Provider Response 1200   This RN called patient to alert him of updated blood culture results just called in, and that MD requests that he come back to the hospital to be treated. Pt verbalized understanding and stated that he had a family emergency and will try to return to hospital on Wednesday, May 11th. Will notify MD

## 2020-08-28 ENCOUNTER — Encounter (HOSPITAL_COMMUNITY): Payer: Self-pay

## 2020-08-28 ENCOUNTER — Inpatient Hospital Stay (HOSPITAL_COMMUNITY)
Admission: EM | Admit: 2020-08-28 | Discharge: 2020-09-04 | DRG: 314 | Disposition: A | Discharge status: Home / Self Care | Payer: Medicaid Other | Attending: Internal Medicine | Admitting: Internal Medicine

## 2020-08-28 ENCOUNTER — Emergency Department (HOSPITAL_COMMUNITY): Payer: Medicaid Other

## 2020-08-28 ENCOUNTER — Other Ambulatory Visit: Payer: Self-pay

## 2020-08-28 DIAGNOSIS — Z86711 Personal history of pulmonary embolism: Secondary | ICD-10-CM | POA: Diagnosis present

## 2020-08-28 DIAGNOSIS — F1120 Opioid dependence, uncomplicated: Secondary | ICD-10-CM | POA: Diagnosis present

## 2020-08-28 DIAGNOSIS — Z20822 Contact with and (suspected) exposure to covid-19: Secondary | ICD-10-CM | POA: Diagnosis present

## 2020-08-28 DIAGNOSIS — T45516A Underdosing of anticoagulants, initial encounter: Secondary | ICD-10-CM | POA: Diagnosis present

## 2020-08-28 DIAGNOSIS — D638 Anemia in other chronic diseases classified elsewhere: Secondary | ICD-10-CM | POA: Diagnosis present

## 2020-08-28 DIAGNOSIS — Z21 Asymptomatic human immunodeficiency virus [HIV] infection status: Secondary | ICD-10-CM | POA: Diagnosis present

## 2020-08-28 DIAGNOSIS — Z886 Allergy status to analgesic agent status: Secondary | ICD-10-CM

## 2020-08-28 DIAGNOSIS — Z91138 Patient's unintentional underdosing of medication regimen for other reason: Secondary | ICD-10-CM

## 2020-08-28 DIAGNOSIS — Z79899 Other long term (current) drug therapy: Secondary | ICD-10-CM

## 2020-08-28 DIAGNOSIS — Y712 Prosthetic and other implants, materials and accessory cardiovascular devices associated with adverse incidents: Secondary | ICD-10-CM | POA: Diagnosis present

## 2020-08-28 DIAGNOSIS — R7881 Bacteremia: Secondary | ICD-10-CM | POA: Diagnosis present

## 2020-08-28 DIAGNOSIS — Z8614 Personal history of Methicillin resistant Staphylococcus aureus infection: Secondary | ICD-10-CM

## 2020-08-28 DIAGNOSIS — E559 Vitamin D deficiency, unspecified: Secondary | ICD-10-CM | POA: Diagnosis present

## 2020-08-28 DIAGNOSIS — F411 Generalized anxiety disorder: Secondary | ICD-10-CM | POA: Diagnosis present

## 2020-08-28 DIAGNOSIS — G894 Chronic pain syndrome: Secondary | ICD-10-CM | POA: Diagnosis present

## 2020-08-28 DIAGNOSIS — B952 Enterococcus as the cause of diseases classified elsewhere: Secondary | ICD-10-CM | POA: Diagnosis present

## 2020-08-28 DIAGNOSIS — D5700 Hb-SS disease with crisis, unspecified: Secondary | ICD-10-CM | POA: Diagnosis present

## 2020-08-28 DIAGNOSIS — Z832 Family history of diseases of the blood and blood-forming organs and certain disorders involving the immune mechanism: Secondary | ICD-10-CM

## 2020-08-28 DIAGNOSIS — B20 Human immunodeficiency virus [HIV] disease: Secondary | ICD-10-CM | POA: Diagnosis present

## 2020-08-28 DIAGNOSIS — T82898A Other specified complication of vascular prosthetic devices, implants and grafts, initial encounter: Principal | ICD-10-CM | POA: Diagnosis present

## 2020-08-28 MED ORDER — HYDROMORPHONE HCL 2 MG/ML IJ SOLN
2.0000 mg | INTRAMUSCULAR | Status: AC
Start: 2020-08-28 — End: 2020-08-28
  Administered 2020-08-28: 2 mg via INTRAVENOUS
  Filled 2020-08-28: qty 1

## 2020-08-28 MED ORDER — DIPHENHYDRAMINE HCL 50 MG/ML IJ SOLN
25.0000 mg | Freq: Once | INTRAMUSCULAR | Status: AC
  Administered 2020-08-28: 25 mg via INTRAVENOUS
  Filled 2020-08-28: qty 1

## 2020-08-28 MED ORDER — ONDANSETRON HCL 4 MG/2ML IJ SOLN
4.0000 mg | INTRAMUSCULAR | Status: DC | PRN
  Administered 2020-09-02 – 2020-09-03 (×2): 4 mg via INTRAVENOUS
  Filled 2020-08-28 (×3): qty 2

## 2020-08-28 MED ORDER — HYDROMORPHONE HCL 2 MG/ML IJ SOLN
2.0000 mg | INTRAMUSCULAR | Status: AC
  Administered 2020-08-29: 2 mg via INTRAVENOUS
  Filled 2020-08-28: qty 1

## 2020-08-28 MED ORDER — VANCOMYCIN HCL 1500 MG/300ML IV SOLN
1500.0000 mg | Freq: Once | INTRAVENOUS | Status: DC
  Filled 2020-08-28: qty 300

## 2020-08-28 NOTE — ED Notes (Signed)
Patient complains of pain in the back and bilateral hips. Patient states crisis started 2 days ago. Was recently admitted to the hospital but had to leave for a family emergency. He states his doctors called him back and said he needed to be seen.

## 2020-08-28 NOTE — ED Notes (Signed)
Patient is in room, in gown.

## 2020-08-28 NOTE — ED Provider Notes (Signed)
[facility]-EMERGENCY DEPT Provider Note   CSN: [id number] Arrival date & time: 08/28/20  2103     History Chief Complaint  Patient presents with  . Sickle Cell Pain Crisis    Martin [name] is a 30 y.o. male with a history of sickle cell anemia, HIV, acute chest, PE on Eliquis, chronic pain who presents the emergency department with a chief complaint of abnormal lab result.  The patient was seen in the ER on May 9 and admitted for acute chest syndrome.  After admission, he left AMA due to a family emergency.  He received a call yesterday to state that his blood cultures were positive and he should return to the emergency department.  He is returning today due to positive blood cultures.  He endorses bilateral low back pain and pain in his legs, which is consistent with previous episodes of sickle cell pain crisis.  Pain is diffuse and aching.  No known aggravating or alleviating factors.  Pain is not worse or different from previous episodes of sickle cell pain crisis.  He does also continue to endorse aching bilateral chest pain and a nonproductive cough.  No fevers, chills, dysuria, hematuria, shortness of breath.  Patient is chronically on 2 L home O2.  No vomiting, diarrhea, constipation, numbness, weakness, dizziness, lightheadedness.  He did receive 1 dose of Rocephin and azithromycin for acute chest syndrome during his last hospitalization.  The history is provided by the patient and medical records. No language interpreter was used.       Past Medical History:  Diagnosis Date  . Anxiety   . HIV (human immunodeficiency virus infection) (HCC)   . Proteinuria   . Sickle cell crisis (HCC)   . 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HCC)   . Vitamin D deficiency 10/2018    Patient Active Problem List   Diagnosis Date Noted  . Acute chest syndrome (HCC) 08/26/2020  . COVID-19 05/13/2020  . Pneumonia, viral 04/04/2020  . Acute and chronic respiratory failure with  hypoxia (HCC) 04/03/2020  . 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/18/2020  . 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 Itching of male genitalia 02/22/2020  . Abnormal penile discharge, without blood 02/22/2020  . Leukocytosis 01/02/2020  . Anxiety 11/27/2019  . Proteinuria 11/27/2019  . Scleral icterus   . Chronic, continuous use of opioids 08/15/2019  . Seasonal allergies 08/15/2019  . Hypoxia   . Chest congestion   . Sickle cell anemia with crisis (HCC) 08/04/2019  . Other chronic pain 08/04/2019  . History of pulmonary embolus (PE) 08/04/2019  . Acute pulmonary embolism (HCC) 02/10/2019  . Acute chest syndrome due to sickle cell crisis (HCC) 02/10/2019  . Sickle cell crisis (HCC) 01/17/2019  . Heart murmur 02/03/2017  . Sickle cell disease with crisis (HCC) 02/02/2017  . 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 Sickle cell pain crisis (HCC) 02/02/2017  . 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HCC) 12/20/2016  . Vitamin D deficiency 08/14/2016  . High risk medication use 09/27/2014  . Generalized anxiety disorder 05/19/2014  . GERD (gastroesophageal reflux disease) 05/19/2014  . HIV (human immunodeficiency virus infection) (HCC) 05/23/2012    Past Surgical History:  Procedure Laterality Date  . IR IMAGING GUIDED PORT INSERTION  08/29/2019       Family History  Problem Relation Age of Onset  . Sickle cell trait Mother   . Sickle cell trait Father   . Birth defects Maternal Grandmother   . Birth defects Paternal Grandmother  Social History   Tobacco Use  . Smoking status: Never Smoker  . Smokeless tobacco: Never Used  Vaping Use  . Vaping Use: Some days  . Substances: THC  Substance Use Topics  . Alcohol use: No  . Drug use: Yes    Types: Marijuana    Home Medications Prior to Admission medications   Medication Sig Start Date End Date Taking? Authorizing Provider  abacavir-dolutegravir-lamiVUDine (TRIUMEQ) 600-50-300 MG tablet Take 1 tablet by mouth at bedtime.    Yes [provider]  ALPRAZolam Prudy Feeler) 1 MG tablet Take 1 mg by mouth 3 (three) times daily as needed for anxiety.   Yes [provider]  apixaban (ELIQUIS) 5 MG TABS tablet Take 1 tablet (5 mg total) by mouth 2 (two) times daily. 08/21/19  Yes Massie Maroon, [name]  cetirizine (ZYRTEC ALLERGY) 10 MG tablet Take 1 tablet (10 mg total) by mouth daily. Patient taking differently: Take 10 mg by mouth daily as needed for allergies or rhinitis. 08/21/20 08/21/21 Yes Massie Maroon, [name]  hydroxyurea (HYDREA) 500 MG capsule Take 4 capsules (2,000 mg total) by mouth at bedtime. 05/17/20  Yes [name], [name]  Multiple Vitamin (MULTIVITAMIN WITH MINERALS) TABS tablet Take 1 tablet by mouth daily with breakfast.    Yes [provider]  oxyCODONE-acetaminophen (PERCOCET) 10-325 MG tablet Take 1 tablet by mouth every 4 (four) hours as needed for up to 15 days for pain. 08/21/20 09/05/20 Yes Massie Maroon, [name]  Vitamin D, Ergocalciferol, (DRISDOL) 1.25 MG (50000 UT) CAPS capsule Take 1 capsule (50,000 Units total) by mouth every 7 (seven) days. Patient taking differently: Take 50,000 Units by mouth every Tuesday. 11/01/18  Yes [name], [name]  traZODone (DESYREL) 100 MG tablet Take 1 tablet (100 mg total) by mouth at bedtime as needed for sleep. Patient not taking: No sig reported 03/26/20   [name], [name]    Allergies    Ketorolac tromethamine and Tape  Review of Systems   Review of Systems  Constitutional: Negative for appetite change, chills and fever.  HENT: Negative for congestion and sore throat.   Respiratory: Negative for cough, shortness of breath and wheezing.   Cardiovascular: Positive for chest pain. Negative for palpitations and leg swelling.  Gastrointestinal: Negative for abdominal pain, constipation, diarrhea, nausea and vomiting.  Genitourinary: Negative for dysuria, flank pain, frequency, genital sores, penile pain, penile swelling, scrotal swelling and  testicular pain.  Musculoskeletal: Positive for back pain and myalgias. Negative for gait problem, joint swelling, neck pain and neck stiffness.  Skin: Negative for rash and wound.  Allergic/Immunologic: Negative for immunocompromised state.  Neurological: Negative for seizures, syncope, weakness, numbness and headaches.  Psychiatric/Behavioral: Negative for confusion.    Physical Exam Updated Vital Signs BP 110/77   Pulse 90   Temp 98.1 F (36.7 C) (Oral)   Resp 18   Wt 68 kg   SpO2 93%   BMI 18.75 kg/m   Physical Exam Vitals and nursing note reviewed.  Constitutional:      General: He is not in acute distress.    Appearance: He is well-developed. He is not ill-appearing, toxic-appearing or diaphoretic.     Comments: [place] in place Chronically ill-appearing.  HENT:     Head: Normocephalic.  Eyes:     General: Scleral icterus present.     Conjunctiva/sclera: Conjunctivae normal.  Cardiovascular:     Rate and Rhythm: Normal rate and regular rhythm.     Heart  sounds: No murmur heard. No friction rub. No gallop.   Pulmonary:     Effort: Pulmonary effort is normal. No respiratory distress.     Breath sounds: No stridor. No wheezing, rhonchi or rales.     Comments: Port in place to right upper chest wall.  No focal tenderness to palpation around the site of his port. Chest:     Chest wall: No tenderness.  Abdominal:     General: There is no distension.     Palpations: Abdomen is soft.     Tenderness: There is no abdominal tenderness.  Musculoskeletal:        General: No tenderness.     Cervical back: Neck supple.     Right lower leg: No edema.     Left lower leg: No edema.  Skin:    General: Skin is warm and dry.     Coloration: Skin is not jaundiced.  Neurological:     Mental Status: He is alert.  Psychiatric:        Behavior: Behavior normal.     ED Results / Procedures / Treatments   Labs (all labs ordered are listed, but only abnormal results are  displayed) Labs Reviewed  CBC WITH DIFFERENTIAL/PLATELET - Abnormal; Notable for the following components:      Result Value   WBC 12.6 (*)    RBC 2.48 (*)    Hemoglobin 8.8 (*)    HCT 23.7 (*)    MCH 35.5 (*)    MCHC 37.1 (*)    RDW 26.0 (*)    nRBC 10.7 (*)    Lymphs Abs 6.6 (*)    Monocytes Absolute 1.4 (*)    All other components within normal limits  RETICULOCYTES - Abnormal; Notable for the following components:   Retic Ct Pct 17.6 (*)    RBC. 1.45 (*)    Retic Count, Absolute 256.0 (*)    Immature Retic Fract 39.2 (*)    All other components within normal limits  COMPREHENSIVE METABOLIC PANEL - Abnormal; Notable for the following components:   Total Protein 9.2 (*)    Total Bilirubin 7.1 (*)    All other components within normal limits  RESP PANEL BY RT-PCR (FLU A&B, COVID) ARPGX2  CULTURE, BLOOD (ROUTINE X 2)  CULTURE, BLOOD (ROUTINE X 2)  URINE CULTURE  URINALYSIS, ROUTINE W REFLEX MICROSCOPIC  TYPE AND SCREEN    EKG None  Radiology DG Chest 2 View  Result Date: 08/28/2020 CLINICAL DATA:  Shortness of breath, sickle cell disease EXAM: CHEST - 2 VIEW COMPARISON:  08/26/2020 FINDINGS: Cardiac shadow is stable. Right chest wall port is again seen and stable. Previously seen patchy airspace opacities are again identified and stable. No new focal abnormality is noted. No bony abnormality is seen. IMPRESSION: Patchy bibasilar airspace opacities stable from the prior exam. Electronically Signed   By: Alcide Clever M.D.   On: 08/28/2020 22:40    Procedures .Critical Care Performed by: Barkley Boards, PA-C Authorized by: Barkley Boards, PA-C   Critical care provider statement:    Critical care time (minutes):  35   Critical care time was exclusive of:  Separately billable procedures and treating other patients and teaching time   Critical care was necessary to treat or prevent imminent or life-threatening deterioration of the following conditions: positive blood  cultures.   Critical care was time spent personally by me on the following activities:  Ordering and performing treatments and interventions, ordering and review of  laboratory studies, ordering and review of radiographic studies, pulse oximetry, re-evaluation of patient's condition, review of old charts, obtaining history from patient or surrogate, examination of patient, evaluation of patient's response to treatment and development of treatment plan with patient or surrogate   I assumed direction of critical care for this patient from another provider in my specialty: no     Care discussed with: admitting provider       Medications Ordered in ED Medications  ondansetron (ZOFRAN) injection 4 mg (has no administration in time range)  diphenhydrAMINE (BENADRYL) injection 25 mg (25 mg Intravenous Given 08/28/20 2340)  HYDROmorphone (DILAUDID) injection 2 mg (2 mg Intravenous Given 08/28/20 2342)  HYDROmorphone (DILAUDID) injection 2 mg (2 mg Intravenous Given 08/29/20 0117)    ED Course  I have reviewed the triage vital signs and the nursing notes.  Pertinent labs & imaging results that were available during my care of the patient were reviewed by me and considered in my medical decision making (see chart for details).    MDM Rules/Calculators/A&P                          30 year old male a history of sickle cell anemia, HIV, acute chest, PE on Eliquis, chronic pain who presents the emergency department with a chief complaint of abnormal lab.  Patient was admitted for acute chest syndrome on May 9 and left AMA due to family emergency.  He received a call yesterday on May 10 advising him to return to the hospital because his blood cultures were positive for Enterococcus faecalis.  Per chart review, the patient did also have a culture that grew MSSA, but it appears that the culture was obtained from the patient's port.  Vital signs are stable on his home oxygen level.  He is afebrile.  Given known  positive blood culture, I discussed with Heather, pharmacy, as there are no sensitivities available.  No previous cultures available for sensitivities.  She recommends starting the patient on vancomycin that would also cover MSSA, but this is most likely due to the patient's port.  He does not have any tenderness around the port.  Port in place to right upper chest wall.  No focal tenderness to palpation around the site of his port.  I am uncertain of the etiology of his positive blood cultures, but will send urinalysis.  He has having back pain, but pain feels consistent with previous episodes of sickle cell pain crisis.  Pain is not out of proportion to exam.  No murmur heard on auscultation to suggest endocarditis, but he is continuing to have chest pain and may benefit from an echo.  Labs and imaging have been reviewed and independently interpreted by me.   Chest x-ray is unchanged from previous. Urinalysis is unremarkable for infection. Hemoglobin is stable from previous.  He has an uptrending leukocytosis.  I am uncertain of where his bacteremia is coming from.  Could consider infected port.  Consult to the hospitalist and Dr. Julian Reil will accept the patient for admission.  After discussion, will change antibiotics to daptomycin.  The patient appears reasonably stabilized for admission considering the current resources, flow, and capabilities available in the ED at this time, and I doubt any other [facility] requiring further screening and/or treatment in the ED prior to admission.   Final Clinical Impression(s) / ED Diagnoses Final diagnoses:  None    Rx / DC Orders ED Discharge Orders    None  Barkley BoardsMcDonald, Orvin Netter A, PA-C 08/29/20 [id number]    Palumbo, April, MD 08/29/20 [id number]

## 2020-08-28 NOTE — ED Triage Notes (Signed)
Pt reports SCC pain worse in lower back and bilateral hips. Pt seen and admitted for same on 5/9. Pt says he had a family emergency and had to leave. Returns today for pain and being called "about infection in blood".

## 2020-08-29 DIAGNOSIS — B20 Human immunodeficiency virus [HIV] disease: Secondary | ICD-10-CM | POA: Diagnosis present

## 2020-08-29 DIAGNOSIS — Z91138 Patient's unintentional underdosing of medication regimen for other reason: Secondary | ICD-10-CM | POA: Diagnosis not present

## 2020-08-29 DIAGNOSIS — D5700 Hb-SS disease with crisis, unspecified: Secondary | ICD-10-CM | POA: Diagnosis present

## 2020-08-29 DIAGNOSIS — F411 Generalized anxiety disorder: Secondary | ICD-10-CM | POA: Diagnosis present

## 2020-08-29 DIAGNOSIS — B952 Enterococcus as the cause of diseases classified elsewhere: Secondary | ICD-10-CM | POA: Diagnosis present

## 2020-08-29 DIAGNOSIS — T45516A Underdosing of anticoagulants, initial encounter: Secondary | ICD-10-CM | POA: Diagnosis present

## 2020-08-29 DIAGNOSIS — R7881 Bacteremia: Secondary | ICD-10-CM | POA: Diagnosis present

## 2020-08-29 DIAGNOSIS — Z86711 Personal history of pulmonary embolism: Secondary | ICD-10-CM | POA: Diagnosis not present

## 2020-08-29 DIAGNOSIS — Z20822 Contact with and (suspected) exposure to covid-19: Secondary | ICD-10-CM | POA: Diagnosis present

## 2020-08-29 DIAGNOSIS — E559 Vitamin D deficiency, unspecified: Secondary | ICD-10-CM | POA: Diagnosis present

## 2020-08-29 DIAGNOSIS — F1120 Opioid dependence, uncomplicated: Secondary | ICD-10-CM | POA: Diagnosis present

## 2020-08-29 DIAGNOSIS — Z8614 Personal history of Methicillin resistant Staphylococcus aureus infection: Secondary | ICD-10-CM | POA: Diagnosis not present

## 2020-08-29 DIAGNOSIS — Z79899 Other long term (current) drug therapy: Secondary | ICD-10-CM | POA: Diagnosis not present

## 2020-08-29 DIAGNOSIS — G894 Chronic pain syndrome: Secondary | ICD-10-CM | POA: Diagnosis present

## 2020-08-29 DIAGNOSIS — I38 Endocarditis, valve unspecified: Secondary | ICD-10-CM | POA: Diagnosis not present

## 2020-08-29 DIAGNOSIS — D638 Anemia in other chronic diseases classified elsewhere: Secondary | ICD-10-CM | POA: Diagnosis present

## 2020-08-29 DIAGNOSIS — T82898A Other specified complication of vascular prosthetic devices, implants and grafts, initial encounter: Secondary | ICD-10-CM | POA: Diagnosis present

## 2020-08-29 DIAGNOSIS — Z886 Allergy status to analgesic agent status: Secondary | ICD-10-CM | POA: Diagnosis not present

## 2020-08-29 DIAGNOSIS — Z832 Family history of diseases of the blood and blood-forming organs and certain disorders involving the immune mechanism: Secondary | ICD-10-CM | POA: Diagnosis not present

## 2020-08-29 DIAGNOSIS — Y712 Prosthetic and other implants, materials and accessory cardiovascular devices associated with adverse incidents: Secondary | ICD-10-CM | POA: Diagnosis present

## 2020-08-29 LAB — COMPREHENSIVE METABOLIC PANEL
ALT: 16 U/L (ref 0–44)
AST: 36 U/L (ref 15–41)
Albumin: 3.9 g/dL (ref 3.5–5.0)
Alkaline Phosphatase: 57 U/L (ref 38–126)
Anion gap: 5 (ref 5–15)
BUN: 8 mg/dL (ref 6–20)
CO2: 27 mmol/L (ref 22–32)
Calcium: 8.9 mg/dL (ref 8.9–10.3)
Chloride: 106 mmol/L (ref 98–111)
Creatinine, Ser: 0.74 mg/dL (ref 0.61–1.24)
GFR, Estimated: >60 mL/min (ref >60)
Glucose, Bld: 88 mg/dL (ref 70–99)
Potassium: 4.3 mmol/L (ref 3.5–5.1)
Sodium: 138 mmol/L (ref 135–145)
Total Bilirubin: 7.1 mg/dL — ABNORMAL HIGH (ref 0.3–1.2)
Total Protein: 9.2 g/dL — ABNORMAL HIGH (ref 6.5–8.1)

## 2020-08-29 LAB — CBC WITH DIFFERENTIAL/PLATELET
Abs Immature Granulocytes: 0.04 10*3/uL (ref 0.00–0.07)
Basophils Absolute: 0.1 10*3/uL (ref 0.0–0.1)
Basophils Relative: 0 %
Eosinophils Absolute: 0.1 10*3/uL (ref 0.0–0.5)
Eosinophils Relative: 1 %
HCT: 23.7 % — ABNORMAL LOW (ref 39.0–52.0)
Hemoglobin: 8.8 g/dL — ABNORMAL LOW (ref 13.0–17.0)
Immature Granulocytes: 0 %
Lymphocytes Relative: 53 %
Lymphs Abs: 6.6 10*3/uL — ABNORMAL HIGH (ref 0.7–4.0)
MCH: 35.5 pg — ABNORMAL HIGH (ref 26.0–34.0)
MCHC: 37.1 g/dL — ABNORMAL HIGH (ref 30.0–36.0)
MCV: 95.6 fL (ref 80.0–100.0)
Monocytes Absolute: 1.4 10*3/uL — ABNORMAL HIGH (ref 0.1–1.0)
Monocytes Relative: 11 %
Neutro Abs: 4.4 10*3/uL (ref 1.7–7.7)
Neutrophils Relative %: 35 %
Platelets: 295 10*3/uL (ref 150–400)
RBC: 2.48 MIL/uL — ABNORMAL LOW (ref 4.22–5.81)
RDW: 26 % — ABNORMAL HIGH (ref 11.5–15.5)
WBC: 12.6 10*3/uL — ABNORMAL HIGH (ref 4.0–10.5)
nRBC: 10.7 % — ABNORMAL HIGH (ref 0.0–0.2)

## 2020-08-29 LAB — URINALYSIS, ROUTINE W REFLEX MICROSCOPIC
Bacteria, UA: NONE SEEN
Bilirubin Urine: NEGATIVE
Glucose, UA: NEGATIVE mg/dL
Hgb urine dipstick: NEGATIVE
Ketones, ur: NEGATIVE mg/dL
Nitrite: NEGATIVE
Protein, ur: NEGATIVE mg/dL
Specific Gravity, Urine: 1.013 (ref 1.005–1.030)
pH: 7 (ref 5.0–8.0)

## 2020-08-29 LAB — TYPE AND SCREEN
ABO/RH(D): O POS
Antibody Screen: NEGATIVE

## 2020-08-29 LAB — RESP PANEL BY RT-PCR (FLU A&B, COVID) ARPGX2
Influenza A by PCR: NEGATIVE
Influenza B by PCR: NEGATIVE
SARS Coronavirus 2 by RT PCR: NEGATIVE

## 2020-08-29 LAB — RETICULOCYTES
Immature Retic Fract: 39.2 % — ABNORMAL HIGH (ref 2.3–15.9)
RBC.: 1.45 MIL/uL — ABNORMAL LOW (ref 4.22–5.81)
Retic Count, Absolute: 256 10*3/uL — ABNORMAL HIGH (ref 19.0–186.0)
Retic Ct Pct: 17.6 % — ABNORMAL HIGH (ref 0.4–3.1)

## 2020-08-29 LAB — CULTURE, BLOOD (ROUTINE X 2)

## 2020-08-29 LAB — CK: Total CK: 17 U/L — ABNORMAL LOW (ref 49–397)

## 2020-08-29 MED ORDER — BISACODYL 10 MG RE SUPP
10.0000 mg | Freq: Once | RECTAL | Status: AC
  Administered 2020-08-29: 10 mg via RECTAL
  Filled 2020-08-29: qty 1

## 2020-08-29 MED ORDER — SODIUM CHLORIDE 0.9 % IV SOLN
500.0000 mg | Freq: Every day | INTRAVENOUS | Status: DC
  Administered 2020-08-29: 500 mg via INTRAVENOUS
  Filled 2020-08-29 (×2): qty 10

## 2020-08-29 MED ORDER — ALPRAZOLAM 1 MG PO TABS
1.0000 mg | ORAL_TABLET | Freq: Three times a day (TID) | ORAL | Status: DC | PRN
  Administered 2020-08-29 – 2020-09-03 (×5): 1 mg via ORAL
  Filled 2020-08-29 (×6): qty 1

## 2020-08-29 MED ORDER — POLYETHYLENE GLYCOL 3350 17 G PO PACK
17.0000 g | PACK | Freq: Every day | ORAL | Status: DC
  Administered 2020-08-29 – 2020-09-04 (×4): 17 g via ORAL
  Filled 2020-08-29 (×5): qty 1

## 2020-08-29 MED ORDER — SENNOSIDES-DOCUSATE SODIUM 8.6-50 MG PO TABS
1.0000 | ORAL_TABLET | Freq: Two times a day (BID) | ORAL | Status: DC
  Administered 2020-08-29: 1 via ORAL
  Filled 2020-08-29: qty 1

## 2020-08-29 MED ORDER — POLYETHYLENE GLYCOL 3350 17 G PO PACK: 17.0000 g | PACK | Freq: Every day | ORAL | Status: DC | PRN

## 2020-08-29 MED ORDER — SODIUM CHLORIDE 0.9 % IV SOLN: INTRAVENOUS | Status: DC

## 2020-08-29 MED ORDER — HYDROMORPHONE HCL 1 MG/ML IJ SOLN
1.0000 mg | INTRAMUSCULAR | Status: DC | PRN
  Administered 2020-08-29 (×9): 1 mg via INTRAVENOUS
  Filled 2020-08-29 (×9): qty 1

## 2020-08-29 MED ORDER — DIPHENHYDRAMINE HCL 25 MG PO CAPS
25.0000 mg | ORAL_CAPSULE | ORAL | Status: DC | PRN
  Filled 2020-08-29 (×7): qty 1

## 2020-08-29 MED ORDER — ABACAVIR-DOLUTEGRAVIR-LAMIVUD 600-50-300 MG PO TABS
1.0000 | ORAL_TABLET | Freq: Every day | ORAL | Status: DC
  Administered 2020-08-29 – 2020-09-03 (×7): 1 via ORAL
  Filled 2020-08-29 (×8): qty 1

## 2020-08-29 MED ORDER — LIP MEDEX EX OINT
TOPICAL_OINTMENT | CUTANEOUS | Status: DC | PRN
  Administered 2020-09-03: 1 via TOPICAL
  Filled 2020-08-29 (×3): qty 7

## 2020-08-29 MED ORDER — SODIUM CHLORIDE 0.9 % IV SOLN
500.0000 mg | INTRAVENOUS | Status: AC
  Administered 2020-08-29: 500 mg via INTRAVENOUS
  Filled 2020-08-29: qty 10

## 2020-08-29 MED ORDER — HYDROMORPHONE 1 MG/ML IV SOLN
INTRAVENOUS | Status: DC
  Administered 2020-08-29: 30 mg via INTRAVENOUS
  Administered 2020-08-29: 7 mg via INTRAVENOUS
  Administered 2020-08-30: 4 mg via INTRAVENOUS
  Administered 2020-08-30: 6.5 mg via INTRAVENOUS
  Administered 2020-08-30: 0.5 mg via INTRAVENOUS
  Administered 2020-08-30: 2.5 mg via INTRAVENOUS
  Administered 2020-08-31: 9 mg via INTRAVENOUS
  Administered 2020-08-31: 1.5 mg via INTRAVENOUS
  Administered 2020-08-31: 30 mg via INTRAVENOUS
  Administered 2020-08-31: 14 mg via INTRAVENOUS
  Administered 2020-08-31: 7.5 mg via INTRAVENOUS
  Administered 2020-08-31: 6.5 mg via INTRAVENOUS
  Administered 2020-08-31: 2 mg via INTRAVENOUS
  Administered 2020-09-01: 3.5 mg via INTRAVENOUS
  Administered 2020-09-01: 2 mg via INTRAVENOUS
  Administered 2020-09-01: 5 mg via INTRAVENOUS
  Administered 2020-09-01: 30 mg via INTRAVENOUS
  Administered 2020-09-01: 3 mg via INTRAVENOUS
  Filled 2020-08-29 (×4): qty 30

## 2020-08-29 MED ORDER — NALOXONE HCL 0.4 MG/ML IJ SOLN: 0.4000 mg | INTRAMUSCULAR | Status: DC | PRN

## 2020-08-29 MED ORDER — SODIUM CHLORIDE 0.9 % IV SOLN
25.0000 mg | INTRAVENOUS | Status: DC | PRN
  Administered 2020-08-29 – 2020-09-04 (×18): 25 mg via INTRAVENOUS
  Filled 2020-08-29 (×4): qty 25
  Filled 2020-08-29: qty 0.5
  Filled 2020-08-29 (×4): qty 25
  Filled 2020-08-29 (×2): qty 0.5
  Filled 2020-08-29 (×9): qty 25

## 2020-08-29 MED ORDER — HYDROMORPHONE HCL 1 MG/ML IJ SOLN: 1.0000 mg | INTRAMUSCULAR | Status: DC | PRN

## 2020-08-29 MED ORDER — HYDROXYUREA 500 MG PO CAPS
2000.0000 mg | ORAL_CAPSULE | Freq: Every day | ORAL | Status: DC
  Administered 2020-08-29 – 2020-09-03 (×6): 2000 mg via ORAL
  Filled 2020-08-29 (×6): qty 4

## 2020-08-29 MED ORDER — SODIUM CHLORIDE 0.9% FLUSH: 9.0000 mL | INTRAVENOUS | Status: DC | PRN

## 2020-08-29 MED ORDER — SENNOSIDES-DOCUSATE SODIUM 8.6-50 MG PO TABS
2.0000 | ORAL_TABLET | Freq: Two times a day (BID) | ORAL | Status: DC
  Administered 2020-08-29 – 2020-09-04 (×11): 2 via ORAL
  Filled 2020-08-29 (×11): qty 2

## 2020-08-29 MED ORDER — ADULT MULTIVITAMIN W/MINERALS CH
1.0000 | ORAL_TABLET | Freq: Every day | ORAL | Status: DC
  Administered 2020-08-29 – 2020-09-04 (×4): 1 via ORAL
  Filled 2020-08-29 (×5): qty 1

## 2020-08-29 MED ORDER — APIXABAN 5 MG PO TABS
5.0000 mg | ORAL_TABLET | Freq: Two times a day (BID) | ORAL | Status: DC
  Administered 2020-08-29 – 2020-09-04 (×13): 5 mg via ORAL
  Filled 2020-08-29 (×13): qty 1

## 2020-08-29 MED ORDER — HYDROMORPHONE HCL 2 MG/ML IJ SOLN
2.0000 mg | Freq: Once | INTRAMUSCULAR | Status: DC
Start: 2020-08-29 — End: 2020-09-01

## 2020-08-29 NOTE — Plan of Care (Signed)
  Problem: Education: Goal: Knowledge of vaso-occlusive preventative measures will improve Outcome: Progressing Goal: Awareness of infection prevention will improve Outcome: Progressing Goal: Awareness of signs and symptoms of anemia will improve Outcome: Progressing Goal: Long-term complications will improve Outcome: Progressing   Problem: Self-Care: Goal: Ability to incorporate actions that prevent/reduce pain crisis will improve Outcome: Progressing   Problem: Tissue Perfusion: Goal: Complications related to inadequate tissue perfusion will be avoided or minimized Outcome: Progressing   Problem: Respiratory: Goal: Pulmonary complications will be avoided or minimized Outcome: Progressing Goal: Acute Chest Syndrome will be identified early to prevent complications Outcome: Progressing   Problem: Fluid Volume: Goal: Ability to maintain a balanced intake and output will improve Outcome: Progressing   Problem: Sensory: Goal: Pain level will decrease with appropriate interventions Outcome: Progressing   Problem: Health Behavior: Goal: Postive changes in compliance with treatment and prescription regimens will improve Outcome: Progressing

## 2020-08-29 NOTE — Progress Notes (Signed)
Pharmacy Antibiotic Note  Martin [name] is a 30 y.o. male admitted on 08/28/2020 with bacteremia.  Seen in ED on 5/9.  Called to return to ED today for + blood cx (BCID+enterococcus, also +MSSA in port-a-cath cx).   Pharmacy has been consulted for daptomycin dosing.  ID to consult in am.   Plan: Daptomycin 500mg  IV x1 dose now F/U ID recommendations, cx data, CK  Weight: 68 kg (150 lb)  Temp (24hrs), Avg:98.1 F (36.7 C), Min:98.1 F (36.7 C), Max:98.1 F (36.7 C)  Recent Labs  Lab 08/26/20 0524 08/28/20 2338  WBC 9.3 12.6*  CREATININE 0.73 0.74    Estimated Creatinine Clearance: 129.9 mL/min (by C-G formula based on SCr of 0.74 mg/dL).    Allergies  Allergen Reactions  . Ketorolac Tromethamine Swelling and Other (See Comments)    Patient reports facial edema and left arm edema after administration.  . Tape Rash and Other (See Comments)    PLEASE DO NOT USE THE CLEAR, THICK, "PLASTIC" TAPE- only paper tape is tolerated     Antimicrobials this admission: 5/12 Daptomycin >>   Dose adjustments this admission:  Microbiology results: 5/11 BCx:  5/11 Resp PCR: negative for COVID, influenza 5/9 BCx (rt hand): enterococcus faecalis  5/9 BCx (porta cath): enterococcus faecalis, S. aureus  Thank you for allowing pharmacy to be a part of this patient's care.  7/9 PharmD, BCPS 08/29/2020 1:16 AM

## 2020-08-29 NOTE — Consult Note (Addendum)
[facility]       Reason for Consult: Enterococcus bacteremia    Referring Physician: Dr. Julian Reil  Principal Problem:   Bacteremia due to Enterococcus Active Problems:   HIV (human immunodeficiency virus infection) (HCC)   History of pulmonary embolus (PE)   Sickle cell anemia with pain (HCC)   . abacavir-dolutegravir-lamiVUDine  1 tablet Oral QHS  . apixaban  5 mg Oral BID  . HYDROmorphone   Intravenous Q4H  . hydroxyurea  2,000 mg Oral QHS  . multivitamin with minerals  1 tablet Oral Q breakfast  . senna-docusate  1 tablet Oral BID    Recommendations: Continue daptomycin TEE - I have requested via [employer] Port removal - ordered via IR, though on Eliquis and this may need to be held.  I will defer to the primary team.   Assessment: He has Enterococcus bacteremia and concern for line as the source.  Concern for endocarditis as well and will request a TEE.  No particular concerns for pneumonia outside of his CXR findings so will continue with daptomycin.   Antibiotics: daptomycin  HPI: Martin [name] is a 30 y.o. male with HIV followed by Dr. Manson Passey in [place], sickle cell disease and chronic port placement presented initially on 5/9 with chest pain and typical crisis.  Blood cultures done but he left the ED at that time.  He was called back when the blood cultures became positive with 4/4 bottles with Enterococcus and one bottle with Staph aureus, not present on BCID.  He has not noted any particularly new fever or chills.  His current port was placed in May of 2021.  He was started on daptomycin.  He is on Triumeq and denies any missed doses or concerns.    Review of Systems:  Constitutional: negative for fevers and chills Gastrointestinal: negative for nausea and diarrhea Integument/breast: negative for rash All other systems reviewed and are negative    Past Medical History:  Diagnosis Date  . Anxiety   . HIV (human  immunodeficiency virus infection) (HCC)   . Proteinuria   . Sickle cell crisis (HCC)   . 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HCC)   . Vitamin D deficiency 10/2018    Social History   Tobacco Use  . Smoking status: Never Smoker  . Smokeless tobacco: Never Used  Vaping Use  . Vaping Use: Some days  . Substances: THC  Substance Use Topics  . Alcohol use: No  . Drug use: Yes    Types: Marijuana    Family History  Problem Relation Age of Onset  . Sickle cell trait Mother   . Sickle cell trait Father   . Birth defects Maternal Grandmother   . Birth defects Paternal Grandmother     Allergies  Allergen Reactions  . Ketorolac Tromethamine Swelling and Other (See Comments)    Patient reports facial edema and left arm edema after administration.  . Tape Rash and Other (See Comments)    PLEASE DO NOT USE THE CLEAR, THICK, "PLASTIC" TAPE- only paper tape is tolerated     Physical Exam: Constitutional: in no apparent distress  Vitals:   08/29/20 0800 08/29/20 1133  BP: 107/67 113/77  Pulse: 66 64  Resp: 12 18  Temp:    SpO2: 95% 94%   EYES: anicteric ENMT: no thrush Cardiovascular: Cor RRR Respiratory: clear; GI: Bowel sounds are normal, liver is not enlarged, spleen is not enlarged  Musculoskeletal: no pedal edema noted Skin: negatives: no  rash Neuro: non-focal  Lab Results  Component Value Date   WBC 12.6 (H) 08/28/2020   HGB 8.8 (L) 08/28/2020   HCT 23.7 (L) 08/28/2020   MCV 95.6 08/28/2020   PLT 295 08/28/2020    Lab Results  Component Value Date   CREATININE 0.74 08/28/2020   BUN 8 08/28/2020   NA 138 08/28/2020   K 4.3 08/28/2020   CL 106 08/28/2020   CO2 27 08/28/2020    Lab Results  Component Value Date   ALT 16 08/28/2020   AST 36 08/28/2020   ALKPHOS 57 08/28/2020     Microbiology: Recent Results (from the past 240 hour(s))  Culture, blood (routine x 2)     Status: Abnormal (Preliminary result)   Collection Time: 08/26/20  6:38 AM   Specimen: BLOOD   Result Value Ref Range Status   Specimen Description   Final    BLOOD PORTA CATH Performed at [facility], [address] [place], [place] [address]    Special Requests   Final    BOTTLES DRAWN AEROBIC AND ANAEROBIC Blood Culture adequate volume Performed at [facility], [address], [place], [place] [address]    Culture  Setup Time   Final    GRAM POSITIVE COCCI IN CHAINS IN BOTH AEROBIC AND ANAEROBIC BOTTLES CRITICAL RESULT CALLED TO, READ BACK BY AND VERIFIED WITH: DR HOLLIS [id number] FCP    Culture (A)  Final    ENTEROCOCCUS FAECALIS STAPHYLOCOCCUS AUREUS REPEATING SENSITIVITY CRITICAL RESULT CALLED TO, READ BACK BY AND VERIFIED WITH: RN K.FREEMAN AT 1157 ON 08/27/2020 BY T.SAAD Performed at [facility], [address], [place], [place] [address]    Report Status PENDING  Incomplete   Organism ID, Bacteria ENTEROCOCCUS FAECALIS  Final      Susceptibility   Enterococcus faecalis - MIC*    AMPICILLIN <=2 SENSITIVE Sensitive     VANCOMYCIN 1 SENSITIVE Sensitive     GENTAMICIN SYNERGY SENSITIVE Sensitive     * ENTEROCOCCUS FAECALIS  Blood Culture ID Panel (Reflexed)     Status: Abnormal   Collection Time: 08/26/20  6:38 AM  Result Value Ref Range Status   Enterococcus faecalis DETECTED (A) NOT DETECTED Final    Comment: CRITICAL RESULT CALLED TO, READ BACK BY AND VERIFIED WITH: DR HOLLIS [id number] FCP    Enterococcus Faecium NOT DETECTED NOT DETECTED Final   Listeria monocytogenes NOT DETECTED NOT DETECTED Final   Staphylococcus species NOT DETECTED NOT DETECTED Final   Staphylococcus aureus (BCID) NOT DETECTED NOT DETECTED Final   Staphylococcus epidermidis NOT DETECTED NOT DETECTED Final   Staphylococcus lugdunensis NOT DETECTED NOT DETECTED Final   Streptococcus species NOT DETECTED NOT DETECTED Final   Streptococcus agalactiae NOT DETECTED NOT DETECTED Final   Streptococcus pneumoniae NOT DETECTED NOT DETECTED  Final   Streptococcus pyogenes NOT DETECTED NOT DETECTED Final   A.calcoaceticus-baumannii NOT DETECTED NOT DETECTED Final   Bacteroides fragilis NOT DETECTED NOT DETECTED Final   Enterobacterales NOT DETECTED NOT DETECTED Final   Enterobacter cloacae complex NOT DETECTED NOT DETECTED Final   Escherichia coli NOT DETECTED NOT DETECTED Final   Klebsiella aerogenes NOT DETECTED NOT DETECTED Final   Klebsiella oxytoca NOT DETECTED NOT DETECTED Final   Klebsiella pneumoniae NOT DETECTED NOT DETECTED Final   Proteus species NOT DETECTED NOT DETECTED Final   Salmonella species NOT DETECTED NOT DETECTED Final   Serratia marcescens NOT DETECTED NOT DETECTED Final   Haemophilus influenzae NOT  DETECTED NOT DETECTED Final   Neisseria meningitidis NOT DETECTED NOT DETECTED Final   Pseudomonas aeruginosa NOT DETECTED NOT DETECTED Final   Stenotrophomonas maltophilia NOT DETECTED NOT DETECTED Final   Candida albicans NOT DETECTED NOT DETECTED Final   Candida auris NOT DETECTED NOT DETECTED Final   Candida glabrata NOT DETECTED NOT DETECTED Final   Candida krusei NOT DETECTED NOT DETECTED Final   Candida parapsilosis NOT DETECTED NOT DETECTED Final   Candida tropicalis NOT DETECTED NOT DETECTED Final   Cryptococcus neoformans/gattii NOT DETECTED NOT DETECTED Final   Meth resistant mecA/C and MREJ NOT DETECTED NOT DETECTED Final   Vancomycin resistance NOT DETECTED NOT DETECTED Final    Comment: Performed at [facility], [address] [place], [place] [address]  Resp Panel by RT-PCR (Flu A&B, Covid) Nasopharyngeal Swab     Status: None   Collection Time: 08/26/20  6:39 AM   Specimen: Nasopharyngeal Swab; Nasopharyngeal(NP) swabs in vial transport medium  Result Value Ref Range Status   SARS Coronavirus 2 by RT PCR NEGATIVE NEGATIVE Final    Comment: (NOTE) SARS-CoV-2 target nucleic acids are NOT DETECTED.  The SARS-CoV-2 RNA is generally detectable in upper respiratory specimens during  the acute phase of infection. The lowest concentration of SARS-CoV-2 viral copies this assay can detect is 138 copies/mL. A negative result does not preclude SARS-Cov-2 infection and should not be used as the sole basis for treatment or other patient management decisions. A negative result may occur with  improper specimen collection/handling, submission of specimen other than nasopharyngeal swab, presence of viral mutation(s) within the areas targeted by this assay, and inadequate number of viral copies(<138 copies/mL). A negative result must be combined with clinical observations, patient history, and epidemiological information. The expected result is Negative.  Fact Sheet for Patients:  BloggerCourse.com  Fact Sheet for Healthcare Providers:  SeriousBroker.it  This test is no t yet approved or cleared by the [place] FDA and  has been authorized for detection and/or diagnosis of SARS-CoV-2 by FDA under an Emergency Use Authorization (EUA). This EUA will remain  in effect (meaning this test can be used) for the duration of the COVID-19 declaration under Section 564(b)(1) of the Act, 21 U.S.C.section 360bbb-3(b)(1), unless the authorization is terminated  or revoked sooner.       Influenza A by PCR NEGATIVE NEGATIVE Final   Influenza B by PCR NEGATIVE NEGATIVE Final    Comment: (NOTE) The Xpert Xpress SARS-CoV-2/FLU/RSV plus assay is intended as an aid in the diagnosis of influenza from Nasopharyngeal swab specimens and should not be used as a sole basis for treatment. Nasal washings and aspirates are unacceptable for Xpert Xpress SARS-CoV-2/FLU/RSV testing.  Fact Sheet for Patients: BloggerCourse.com  Fact Sheet for Healthcare Providers: SeriousBroker.it  This test is not yet approved or cleared by the [place] FDA and has been authorized for detection and/or  diagnosis of SARS-CoV-2 by FDA under an Emergency Use Authorization (EUA). This EUA will remain in effect (meaning this test can be used) for the duration of the COVID-19 declaration under Section 564(b)(1) of the Act, 21 U.S.C. section 360bbb-3(b)(1), unless the authorization is terminated or revoked.  Performed at [facility], [address] [place], [place] [address]   Culture, blood (routine x 2)     Status: Abnormal   Collection Time: 08/26/20  6:43 AM   Specimen: BLOOD RIGHT HAND  Result Value Ref Range Status   Specimen Description   Final  BLOOD RIGHT HAND Performed at [facility], [address] [place], [place] [address]    Special Requests   Final    BOTTLES DRAWN AEROBIC AND ANAEROBIC Blood Culture results may not be optimal due to an inadequate volume of blood received in culture bottles Performed at [facility], [address] [place], [place] [address]    Culture  Setup Time   Final    GRAM POSITIVE COCCI IN CHAINS IN BOTH AEROBIC AND ANAEROBIC BOTTLES    Culture (A)  Final    ENTEROCOCCUS FAECALIS SUSCEPTIBILITIES PERFORMED ON PREVIOUS CULTURE WITHIN THE LAST 5 DAYS. Performed at [facility], [address] [place], [place] [address]    Report Status 08/29/2020 FINAL  Final  Resp Panel by RT-PCR (Flu A&B, Covid) Nasopharyngeal Swab     Status: None   Collection Time: 08/28/20 11:38 PM   Specimen: Nasopharyngeal Swab; Nasopharyngeal(NP) swabs in vial transport medium  Result Value Ref Range Status   SARS Coronavirus 2 by RT PCR NEGATIVE NEGATIVE Final    Comment: (NOTE) SARS-CoV-2 target nucleic acids are NOT DETECTED.  The SARS-CoV-2 RNA is generally detectable in upper respiratory specimens during the acute phase of infection. The lowest concentration of SARS-CoV-2 viral copies this assay can detect is 138 copies/mL. A negative result does not preclude SARS-Cov-2 infection and should  not be used as the sole basis for treatment or other patient management decisions. A negative result may occur with  improper specimen collection/handling, submission of specimen other than nasopharyngeal swab, presence of viral mutation(s) within the areas targeted by this assay, and inadequate number of viral copies(<138 copies/mL). A negative result must be combined with clinical observations, patient history, and epidemiological information. The expected result is Negative.  Fact Sheet for Patients:  BloggerCourse.comhttps://www.fda.gov/media/152166/download  Fact Sheet for Healthcare Providers:  SeriousBroker.ithttps://www.fda.gov/media/152162/download  This test is no t yet approved or cleared by the [place] FDA and  has been authorized for detection and/or diagnosis of SARS-CoV-2 by FDA under an Emergency Use Authorization (EUA). This EUA will remain  in effect (meaning this test can be used) for the duration of the COVID-19 declaration under Section 564(b)(1) of the Act, 21 U.S.C.section 360bbb-3(b)(1), unless the authorization is terminated  or revoked sooner.       Influenza A by PCR NEGATIVE NEGATIVE Final   Influenza B by PCR NEGATIVE NEGATIVE Final    Comment: (NOTE) The Xpert Xpress SARS-CoV-2/FLU/RSV plus assay is intended as an aid in the diagnosis of influenza from Nasopharyngeal swab specimens and should not be used as a sole basis for treatment. Nasal washings and aspirates are unacceptable for Xpert Xpress SARS-CoV-2/FLU/RSV testing.  Fact Sheet for Patients: BloggerCourse.comhttps://www.fda.gov/media/152166/download  Fact Sheet for Healthcare Providers: SeriousBroker.ithttps://www.fda.gov/media/152162/download  This test is not yet approved or cleared by the [place] FDA and has been authorized for detection and/or diagnosis of SARS-CoV-2 by FDA under an Emergency Use Authorization (EUA). This EUA will remain in effect (meaning this test can be used) for the duration of the COVID-19 declaration under  Section 564(b)(1) of the Act, 21 U.S.C. section 360bbb-3(b)(1), unless the authorization is terminated or revoked.  Performed at [facility], [address] [place], [place] [address]     Gardiner Barefootobert W Glennis Borger, MD [facility] [email or web address] 08/29/2020, 11:40 AM

## 2020-08-29 NOTE — Progress Notes (Signed)
    [facility] has been requested to perform a transesophageal echocardiogram on Martin [name] for bacteremia.  After careful review of history and examination, the risks and benefits of transesophageal echocardiogram have been explained including risks of esophageal damage, perforation (1:10,000 risk), bleeding, pharyngeal hematoma as well as other potential complications associated with conscious sedation including aspiration, arrhythmia, respiratory failure and death. Alternatives to treatment were discussed, questions were answered. Patient is willing to proceed.   Georgie Chard, NP  08/29/2020 3:52 PM

## 2020-08-29 NOTE — Progress Notes (Signed)
Martin [name] is a 30 year old male with a medical history significant for sickle cell disease, chronic pain syndrome, opiate dependence and tolerance, history of HIV, history of PE on Eliquis, and history of anemia of chronic disease was admitted overnight with bacteremia in the setting of sickle cell pain crisis.  Patient is complaining of significant pain primarily to low back and lower extremities.  He rates pain as 9/10.  He says that he has been unable to get comfortable.  Patient is currently afebrile.  Oxygen saturation is 99% on RA.  Infectious disease consulted concerning bacteremia.  IV antibiotics initiated.  Port-A-Cath will be removed on 08/30/2020.  No further changes will be made to patient's care plan at this time.  Will reassess in AM.   [name]  APRN, [name], [name] Patient Care [facility] [address] [place], [place] [address] [phone]

## 2020-08-29 NOTE — H&P (Signed)
Chief Complaint: Bacteremia  Referring Physician(s): Comer  Supervising Physician: Simonne Come  Patient Status: [facility] [place] - In-pt  History of Present Illness: Martin [name] is a 30 y.o. male with medical issues including Sickle cell dz, HIV, PE on Eliquis, and poor venous access with history of port a cath placement.   He has had a prior portacath infection requiring removal and replacement.    Current port was placed Aug 29, 2019 by Dr. Grace Isaac.  He was admitted on 5/9 for possible acute chest syndrome.    He was given one dose of rocephin + azithro but he had to leave due to family issues.  He ws called back by hospital after blood cultures came back positive for Enterococcus.  We are asked to remove his tunneled catheter with port.  He is not currently NPO and states the he would require sedation for removal.   He tells me that the last time he had his port removed that it was painful and he was "not very sleepy".  Past Medical History:  Diagnosis Date  . Anxiety   . HIV (human immunodeficiency virus infection) (HCC)   . Proteinuria   . Sickle cell crisis (HCC)   . 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HCC)   . Vitamin D deficiency 10/2018    Past Surgical History:  Procedure Laterality Date  . IR IMAGING GUIDED PORT INSERTION  08/29/2019    Allergies: Ketorolac tromethamine and Tape  Medications: Prior to Admission medications   Medication Sig Start Date End Date Taking? Authorizing Provider  abacavir-dolutegravir-lamiVUDine (TRIUMEQ) 600-50-300 MG tablet Take 1 tablet by mouth at bedtime.    Yes [provider]  ALPRAZolam Prudy Feeler) 1 MG tablet Take 1 mg by mouth 3 (three) times daily as needed for anxiety.   Yes [provider]  apixaban (ELIQUIS) 5 MG TABS tablet Take 1 tablet (5 mg total) by mouth 2 (two) times daily. 08/21/19  Yes Massie Maroon, [name]  cetirizine (ZYRTEC ALLERGY) 10 MG tablet Take 1 tablet (10 mg total) by mouth daily. Patient  taking differently: Take 10 mg by mouth daily as needed for allergies or rhinitis. 08/21/20 08/21/21 Yes Massie Maroon, [name]  hydroxyurea (HYDREA) 500 MG capsule Take 4 capsules (2,000 mg total) by mouth at bedtime. 05/17/20  Yes [name], [name]  Multiple Vitamin (MULTIVITAMIN WITH MINERALS) TABS tablet Take 1 tablet by mouth daily with breakfast.    Yes [provider]  oxyCODONE-acetaminophen (PERCOCET) 10-325 MG tablet Take 1 tablet by mouth every 4 (four) hours as needed for up to 15 days for pain. 08/21/20 09/05/20 Yes Massie Maroon, [name]  Vitamin D, Ergocalciferol, (DRISDOL) 1.25 MG (50000 UT) CAPS capsule Take 1 capsule (50,000 Units total) by mouth every 7 (seven) days. Patient taking differently: Take 50,000 Units by mouth every Tuesday. 11/01/18  Yes [name], [name]     Family History  Problem Relation Age of Onset  . Sickle cell trait Mother   . Sickle cell trait Father   . Birth defects Maternal Grandmother   . Birth defects Paternal Grandmother     Social History   Socioeconomic History  . Marital status: Single    Spouse name: Not on file  . Number of children: Not on file  . Years of education: Not on file  . Highest education level: Not on file  Occupational History  . Not on file  Tobacco Use  . Smoking status: Never Smoker  . Smokeless  tobacco: Never Used  Vaping Use  . Vaping Use: Some days  . Substances: THC  Substance and Sexual Activity  . Alcohol use: No  . Drug use: Yes    Types: Marijuana  . Sexual activity: Yes    Birth control/protection: Condom  Other Topics Concern  . Not on file  Social History Narrative  . Not on file   Social Determinants of Health   Financial Resource Strain: Not on file  Food Insecurity: Not on file  Transportation Needs: Not on file  Physical Activity: Not on file  Stress: Not on file  Social Connections: Not on file     Review of Systems: A 12 point ROS discussed and pertinent positives are  indicated in the HPI above.  All other systems are negative.  Review of Systems  Vital Signs: BP 113/77 (BP Location: Left Arm)   Pulse 64   Temp 98.1 F (36.7 C) (Oral)   Resp 18   Wt 68 kg   SpO2 94%   BMI 18.75 kg/m   Physical Exam Vitals reviewed.  Constitutional:      Appearance: Normal appearance.  HENT:     Head: Normocephalic and atraumatic.  Eyes:     Extraocular Movements: Extraocular movements intact.  Cardiovascular:     Rate and Rhythm: Normal rate and regular rhythm.  Pulmonary:     Effort: Pulmonary effort is normal. No respiratory distress.     Breath sounds: Normal breath sounds.  Chest:     Comments: Port on Right - accessed. No erythema. Abdominal:     Palpations: Abdomen is soft.  Musculoskeletal:        General: Normal range of motion.     Cervical back: Normal range of motion.  Skin:    General: Skin is warm and dry.  Neurological:     General: No focal deficit present.     Mental Status: He is alert and oriented to person, place, and time.  Psychiatric:        Mood and Affect: Mood normal.        Behavior: Behavior normal.        Thought Content: Thought content normal.        Judgment: Judgment normal.     Imaging: DG Chest 2 View  Result Date: 08/28/2020 CLINICAL DATA:  Shortness of breath, sickle cell disease EXAM: CHEST - 2 VIEW COMPARISON:  08/26/2020 FINDINGS: Cardiac shadow is stable. Right chest wall port is again seen and stable. Previously seen patchy airspace opacities are again identified and stable. No new focal abnormality is noted. No bony abnormality is seen. IMPRESSION: Patchy bibasilar airspace opacities stable from the prior exam. Electronically Signed   By: Alcide Clever M.D.   On: 08/28/2020 22:40   DG Chest 2 View  Result Date: 08/26/2020 CLINICAL DATA:  30 year old male with sickle cell disease and pain crisis. EXAM: CHEST - 2 VIEW COMPARISON:  Portable chest 05/16/2020 and earlier. FINDINGS: Semi upright AP and  lateral views of the chest. Stable right chest power port. Lung volumes are lower than baseline. Stable mild cardiomegaly. Other mediastinal contours are within normal limits. Visualized tracheal air column is within normal limits. No pneumothorax, pulmonary edema or pleural effusion. However, there is patchy and indistinct bilateral lower lung opacity on both views. Visible spinal vertebrae appear intact and relatively normal. Cholecystectomy clips in the abdomen. Negative visible bowel gas pattern. IMPRESSION: Increased patchy and indistinct bilateral lower lung opacity suspicious for acute chest syndrome in  this setting. No pleural effusion. Electronically Signed   By: Odessa Fleming M.D.   On: 08/26/2020 06:22    Labs:  CBC: Recent Labs    05/16/20 1140 06/10/20 0321 08/26/20 0524 08/28/20 2338  WBC 10.8* 12.1* 9.3 12.6*  HGB 7.3* 7.2* 8.2* 8.8*  HCT 19.1* 20.5* 11.0* 23.7*  PLT 332 245 322 295    COAGS: No results for input(s): INR, APTT in the last 8760 hours.  BMP: Recent Labs    12/04/19 0802 12/12/19 0955 01/02/20 0634 01/03/20 0533 01/29/20 0746 05/16/20 1140 06/10/20 0321 08/26/20 0524 08/28/20 2338  NA 140 133* 140 137   < > 138 141 139 138  K 4.1 4.2 4.1 4.9   < > 4.1 3.6 3.8 4.3  CL 106 103 106 108   < > 101 110 107 106  CO2 27 23 23 25    < > 28 26 26 27   GLUCOSE 102* 92 97 88   < > 121* 97 87 88  BUN 13 7 12 9    < > 11 5* 7 8  CALCIUM 9.0 8.4* 9.1 8.6*   < > 8.7* 8.6* 8.8* 8.9  CREATININE 0.73 0.64 0.63 0.69   < > 0.82 0.59* 0.73 0.74  GFRNONAA >60 >60 >60 >60   < > >60 >60 >60 >60  GFRAA >60 >60 >60 >60  --   --   --   --   --    < > = values in this interval not displayed.    LIVER FUNCTION TESTS: Recent Labs    05/13/20 0325 05/16/20 1140 08/26/20 0524 08/28/20 2338  BILITOT 9.1* 3.3* 5.3* 7.1*  AST 54* 50* 33 36  ALT 27 26 15 16   ALKPHOS 57 50 57 57  PROT 8.7* 8.6* 8.5* 9.2*  ALBUMIN 3.7 3.6 3.7 3.9    TUMOR MARKERS: No results for input(s):  AFPTM, CEA, CA199, CHROMGRNA in the last 8760 hours.  Assessment and Plan:  Bacteremia  Will remove the tunneled catheter with port tomorrow (patient not NPO and requesting sedation).  Risks and benefits of tunneled catheter removal were discussed with the patient including bleeding, infection, damage to adjacent structures, malfunction of the catheter with need for additional procedures.  All of the patient's questions were answered, patient is agreeable to proceed. Consent signed and in chart.  Electronically Signed: 10/26/20, PA-C   08/29/2020, 12:07 PM      I spent a total of    15 Minutes in face to face in clinical consultation, greater than 50% of which was counseling/coordinating care for port removal.

## 2020-08-29 NOTE — H&P (Signed)
History and Physical    Martin [name] GNF:[id number] DOB: 1991-02-11 DOA: 08/28/2020  PCP: Massie Maroon, [name]  Patient coming from: Home  I have personally briefly reviewed patient's old medical records in [facility]  Chief Complaint: Bacteremia  HPI: Page X Martin [name] is a 30 y.o. male with medical history significant of Sickle cell dz, HIV, portacath.  Prior portacath infection requiring removal and replacement in past.  Prior MRSA infection per patient, PE, supposed to be taking eliquis but pt admits to missing doses.  Pt with admission on 5/9 for possible acute chest syndrome.  Got one dose of rocephin + azithro, had to leave due to family issues.  Called back by hospital after BCx done on 5/9 came back 4/4 bottles positive for Enterococcus, and (1 or 2, not sure) positive for S. Aureus (S. Aureus was in the Port culture, Enterococcus was in both port and R wrist).  S. Aureus not detected on the BCID (just enterococcus).  Susceptibilities are pending.  Pt continues to have B low back pain and pain in legs which is c/w prior episodes of sickle cell pain crisis.  Pain not worse or different from prior episodes.  No fevers, chills, dysuria, hematuria, SOB.  Pt on chronic 2L home O2.   ED Course: WBC 12k.  Hospitalist asked to admit for bacteremia.   Review of Systems: As per HPI, otherwise all review of systems negative.  Past Medical History:  Diagnosis Date  . Anxiety   . HIV (human immunodeficiency virus infection) (HCC)   . Proteinuria   . Sickle cell crisis (HCC)   . 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HCC)   . Vitamin D deficiency 10/2018    Past Surgical History:  Procedure Laterality Date  . IR IMAGING GUIDED PORT INSERTION  08/29/2019     reports that he has never smoked. He has never used smokeless tobacco. He reports current drug use. Drug: Marijuana. He reports that he does not drink alcohol.  Allergies  Allergen Reactions  . Ketorolac Tromethamine  Swelling and Other (See Comments)    Patient reports facial edema and left arm edema after administration.  . Tape Rash and Other (See Comments)    PLEASE DO NOT USE THE CLEAR, THICK, "PLASTIC" TAPE- only paper tape is tolerated     Family History  Problem Relation Age of Onset  . Sickle cell trait Mother   . Sickle cell trait Father   . Birth defects Maternal Grandmother   . Birth defects Paternal Grandmother      Prior to Admission medications   Medication Sig Start Date End Date Taking? Authorizing Provider  abacavir-dolutegravir-lamiVUDine (TRIUMEQ) 600-50-300 MG tablet Take 1 tablet by mouth at bedtime.    Yes [provider]  ALPRAZolam Prudy Feeler) 1 MG tablet Take 1 mg by mouth 3 (three) times daily as needed for anxiety.   Yes [provider]  apixaban (ELIQUIS) 5 MG TABS tablet Take 1 tablet (5 mg total) by mouth 2 (two) times daily. 08/21/19  Yes Massie Maroon, [name]  cetirizine (ZYRTEC ALLERGY) 10 MG tablet Take 1 tablet (10 mg total) by mouth daily. Patient taking differently: Take 10 mg by mouth daily as needed for allergies or rhinitis. 08/21/20 08/21/21 Yes Massie Maroon, [name]  hydroxyurea (HYDREA) 500 MG capsule Take 4 capsules (2,000 mg total) by mouth at bedtime. 05/17/20  Yes [name], [name]  Multiple Vitamin (MULTIVITAMIN WITH MINERALS) TABS tablet Take 1 tablet by mouth daily with breakfast.  Yes [provider]  oxyCODONE-acetaminophen (PERCOCET) 10-325 MG tablet Take 1 tablet by mouth every 4 (four) hours as needed for up to 15 days for pain. 08/21/20 09/05/20 Yes Massie Maroon, [name]  Vitamin D, Ergocalciferol, (DRISDOL) 1.25 MG (50000 UT) CAPS capsule Take 1 capsule (50,000 Units total) by mouth every 7 (seven) days. Patient taking differently: Take 50,000 Units by mouth every Tuesday. 11/01/18  Yes [name], [name]    Physical Exam: Vitals:   08/28/20 2129 08/28/20 2200 08/28/20 2330 08/29/20 0100  BP: 102/77 115/73 113/74  110/77  Pulse: 90 86 95 90  Resp: 18 19 11 18   Temp: 98.1 F (36.7 C)     TempSrc: Oral     SpO2: (!) 88% 94% 95% 93%  Weight:        Constitutional: NAD, calm, comfortable Eyes: PERRL, lids and conjunctivae normal ENMT: Mucous membranes are moist. Posterior pharynx clear of any exudate or lesions.Normal dentition.  Neck: normal, supple, no masses, no thyromegaly Respiratory: clear to auscultation bilaterally, no wheezing, no crackles. Normal respiratory effort. No accessory muscle use.  Cardiovascular: Regular rate and rhythm, no murmurs / rubs / gallops. No extremity edema. 2+ pedal pulses. No carotid bruits.  Abdomen: no tenderness, no masses palpated. No hepatosplenomegaly. Bowel sounds positive.  Musculoskeletal: no clubbing / cyanosis. No joint deformity upper and lower extremities. Good ROM, no contractures. Normal muscle tone.  Skin: no rashes, lesions, ulcers. No induration Neurologic: CN 2-12 grossly intact. Sensation intact, DTR normal. Strength 5/5 in all 4.  Psychiatric: Normal judgment and insight. Alert and oriented x 3. Normal mood.    Labs on Admission: I have personally reviewed following labs and imaging studies  CBC: Recent Labs  Lab 08/26/20 0524 08/28/20 2338  WBC 9.3 12.6*  NEUTROABS 2.0 4.4  HGB 8.2* 8.8*  HCT 11.0* 23.7*  MCV 99.1 95.6  PLT 322 295   Basic Metabolic Panel: Recent Labs  Lab 08/26/20 0524 08/28/20 2338  NA 139 138  K 3.8 4.3  CL 107 106  CO2 26 27  GLUCOSE 87 88  BUN 7 8  CREATININE 0.73 0.74  CALCIUM 8.8* 8.9   GFR: Estimated Creatinine Clearance: 129.9 mL/min (by C-G formula based on SCr of 0.74 mg/dL). Liver Function Tests: Recent Labs  Lab 08/26/20 0524 08/28/20 2338  AST 33 36  ALT 15 16  ALKPHOS 57 57  BILITOT 5.3* 7.1*  PROT 8.5* 9.2*  ALBUMIN 3.7 3.9   No results for input(s): LIPASE, AMYLASE in the last 168 hours. No results for input(s): AMMONIA in the last 168 hours. Coagulation Profile: No results  for input(s): INR, PROTIME in the last 168 hours. Cardiac Enzymes: No results for input(s): CKTOTAL, CKMB, CKMBINDEX, TROPONINI in the last 168 hours. BNP (last 3 results) No results for input(s): PROBNP in the last 8760 hours. HbA1C: No results for input(s): HGBA1C in the last 72 hours. CBG: No results for input(s): GLUCAP in the last 168 hours. Lipid Profile: No results for input(s): CHOL, HDL, LDLCALC, [name], CHOLHDL, LDLDIRECT in the last 72 hours. Thyroid Function Tests: No results for input(s): TSH, T4TOTAL, FREET4, T3FREE, THYROIDAB in the last 72 hours. Anemia Panel: Recent Labs    08/26/20 0524 08/28/20 2338  RETICCTPCT 23.3* 17.6*   Urine analysis:    Component Value Date/Time   COLORURINE YELLOW 04/01/2020 1742   APPEARANCEUR CLEAR 04/01/2020 1742   LABSPEC 1.024 04/01/2020 1742   PHURINE 5.0 04/01/2020 1742   GLUCOSEU NEGATIVE 04/01/2020 1742  HGBUR NEGATIVE 04/01/2020 1742   BILIRUBINUR NEGATIVE 04/01/2020 1742   BILIRUBINUR neg 11/27/2019 1149   KETONESUR NEGATIVE 04/01/2020 1742   PROTEINUR NEGATIVE 04/01/2020 1742   UROBILINOGEN >=8.0 (A) 11/27/2019 1149   UROBILINOGEN 4.0 (H) 05/21/2009 1944   NITRITE NEGATIVE 04/01/2020 1742   LEUKOCYTESUR NEGATIVE 04/01/2020 1742    Radiological Exams on Admission: DG Chest 2 View  Result Date: 08/28/2020 CLINICAL DATA:  Shortness of breath, sickle cell disease EXAM: CHEST - 2 VIEW COMPARISON:  08/26/2020 FINDINGS: Cardiac shadow is stable. Right chest wall port is again seen and stable. Previously seen patchy airspace opacities are again identified and stable. No new focal abnormality is noted. No bony abnormality is seen. IMPRESSION: Patchy bibasilar airspace opacities stable from the prior exam. Electronically Signed   By: Alcide CleverMark  Lukens M.D.   On: 08/28/2020 22:40    EKG: Independently reviewed.  Assessment/Plan Principal Problem:   Bacteremia due to Enterococcus Active Problems:   HIV (human immunodeficiency  virus infection) (HCC)   History of pulmonary embolus (PE)   Sickle cell anemia with pain (HCC)    1. Bacteremia due to enterococcus - 1. With 4/4 cultures positive, probably real bacteremia. 2. Unusual to be a cause of PNA 1. Not clear that he HAS new lung infection (see #3 below) 3. More concerned for Port infection vs endocarditis. 4. Empiric dapto for the moment as sensitivities are pending currently (will cover bacteremia due to enterococcus, will cover S. Aureus including resistant strains of both (MRSA + VRE), but wont cover PNA) 5. Needs ID consult in AM. 6. Repeat cultures pending. 7. Defer need for echo to ID. 2. Culture positive for S.Aureus - 1. Unclear if this is a contaminate or true positive. 2. Not seen on wrist culture at this time. 3. Getting dapto anyhow for enterococcus at the moment 4. certainly pt doesn't seem as Ill as id expect with bacteremia due to S. Aureus which is known to be significantly more virulent than Enterococcus. 5. Needs ID consult in AM, unclear significance of S. Aureus, but covered anyhow for blood stream infection with dapto for the moment. 3. Pulmonary opacities on CXR - 1. Not clear that these are new, pt has known B pulmonary scaring chronically seen on pretty much every prior CXR for a number of months. 2. Hasnt been taking eliquis as much as we would like 3. May want to consider CTA chest, but given lack of pulmonary symptoms will hold off for now. 4. Sickle cell crisis with pain - 1. Sickle cell pathway 2. Dilaudid per PCA (currently on PRN because hes still in ED) 3. Cont hydrea 4. Allergy to toradol 5. H/o PE - 1. Resume eliquis 6. HIV - 1. Cont HAART  DVT prophylaxis: Eliquis Code Status: Full Family Communication: No family in room Disposition Plan: Home after cleared by ID Consults called: None, call ID in AM Admission status: Admit to inpatient  Severity of Illness: The appropriate patient status for this patient is  INPATIENT. Inpatient status is judged to be reasonable and necessary in order to provide the required intensity of service to ensure the patient's safety. The patient's presenting symptoms, physical exam findings, and initial radiographic and laboratory data in the context of their chronic comorbidities is felt to place them at high risk for further clinical deterioration. Furthermore, it is not anticipated that the patient will be medically stable for discharge from the hospital within 2 midnights of admission. The following factors support the patient status of  inpatient.   IP status for treatment of bacteremia.   * I certify that at the point of admission it is my clinical judgment that the patient will require inpatient hospital care spanning beyond 2 midnights from the point of admission due to high intensity of service, high risk for further deterioration and high frequency of surveillance required.*    Genieve Ramaswamy M. DO [facility]  How to contact the [facility] Attending or Consulting provider 7A - 7P or covering provider during after hours 7P -7A, for this patient?  1. Check the care team in [facility] and look for a) attending/consulting [facility] provider listed and b) the [facility] team listed 2. Log into [email or web address]  Amion Physician Scheduling and messaging for groups and whole hospitals  On call and physician scheduling software for group practices, residents, hospitalists and other medical providers for call, clinic, rotation and shift schedules. On[employer] is a hospital-wide system for scheduling doctors and paging doctors on call. EasyPlot is for scientific plotting and data analysis.  [email or web address]  and use [employer]'s universal password to access. If you do not have the password, please contact the hospital operator.  3. Locate the [facility] provider you are looking for under [facility] and page to a number that you can be directly reached. 4. If you still have difficulty reaching  the provider, please page the [facility] (Director on Call) for the Hospitalists listed on amion for assistance.  08/29/2020, 1:39 AM

## 2020-08-30 ENCOUNTER — Encounter (HOSPITAL_COMMUNITY): Payer: Self-pay | Admitting: Internal Medicine

## 2020-08-30 ENCOUNTER — Inpatient Hospital Stay (HOSPITAL_COMMUNITY): Payer: Medicaid Other

## 2020-08-30 ENCOUNTER — Inpatient Hospital Stay (HOSPITAL_COMMUNITY): Payer: Medicaid Other | Admitting: Registered Nurse

## 2020-08-30 ENCOUNTER — Encounter (HOSPITAL_COMMUNITY)
Admission: EM | Disposition: A | Discharge status: Home / Self Care | Payer: Self-pay | Source: Home / Self Care | Attending: Internal Medicine

## 2020-08-30 DIAGNOSIS — R7881 Bacteremia: Secondary | ICD-10-CM

## 2020-08-30 DIAGNOSIS — B952 Enterococcus as the cause of diseases classified elsewhere: Secondary | ICD-10-CM | POA: Diagnosis not present

## 2020-08-30 DIAGNOSIS — I38 Endocarditis, valve unspecified: Secondary | ICD-10-CM

## 2020-08-30 HISTORY — PX: IR REMOVAL TUN ACCESS W/ PORT W/O FL MOD SED: IMG2290

## 2020-08-30 HISTORY — PX: TEE WITHOUT CARDIOVERSION: SHX5443

## 2020-08-30 LAB — BLOOD CULTURE ID PANEL (REFLEXED) - BCID2

## 2020-08-30 LAB — CBC WITH DIFFERENTIAL/PLATELET
Abs Immature Granulocytes: 0.03 10*3/uL (ref 0.00–0.07)
Basophils Absolute: 0 10*3/uL (ref 0.0–0.1)
Basophils Relative: 0 %
Eosinophils Absolute: 0.2 10*3/uL (ref 0.0–0.5)
Eosinophils Relative: 2 %
HCT: 24.2 % — ABNORMAL LOW (ref 39.0–52.0)
Hemoglobin: 8.8 g/dL — ABNORMAL LOW (ref 13.0–17.0)
Immature Granulocytes: 0 %
Lymphocytes Relative: 62 %
Lymphs Abs: 6.6 10*3/uL — ABNORMAL HIGH (ref 0.7–4.0)
MCH: 34.8 pg — ABNORMAL HIGH (ref 26.0–34.0)
MCHC: 36.4 g/dL — ABNORMAL HIGH (ref 30.0–36.0)
MCV: 95.7 fL (ref 80.0–100.0)
Monocytes Absolute: 1 10*3/uL (ref 0.1–1.0)
Monocytes Relative: 9 %
Neutro Abs: 2.9 10*3/uL (ref 1.7–7.7)
Neutrophils Relative %: 27 %
Platelets: 323 10*3/uL (ref 150–400)
RBC: 2.53 MIL/uL — ABNORMAL LOW (ref 4.22–5.81)
RDW: 24.5 % — ABNORMAL HIGH (ref 11.5–15.5)
WBC Morphology: ABNORMAL
WBC: 10.7 10*3/uL — ABNORMAL HIGH (ref 4.0–10.5)
nRBC: 1.6 % — ABNORMAL HIGH (ref 0.0–0.2)

## 2020-08-30 LAB — CULTURE, BLOOD (ROUTINE X 2): Special Requests: ADEQUATE

## 2020-08-30 LAB — URINE CULTURE: Culture: <10000 — AB

## 2020-08-30 SURGERY — ECHOCARDIOGRAM, TRANSESOPHAGEAL
Anesthesia: Monitor Anesthesia Care

## 2020-08-30 MED ORDER — DIPHENHYDRAMINE HCL 50 MG/ML IJ SOLN
INTRAMUSCULAR | Status: AC | PRN
  Administered 2020-08-30: 25 mg via INTRAVENOUS

## 2020-08-30 MED ORDER — PROPOFOL 500 MG/50ML IV EMUL
INTRAVENOUS | Status: DC | PRN
  Administered 2020-08-30: 75 ug/kg/min via INTRAVENOUS

## 2020-08-30 MED ORDER — LACTATED RINGERS IV SOLN: INTRAVENOUS | Status: DC | PRN

## 2020-08-30 MED ORDER — FLEET ENEMA 7-19 GM/118ML RE ENEM
1.0000 | ENEMA | Freq: Once | RECTAL | Status: AC
  Administered 2020-08-30: 1 via RECTAL
  Filled 2020-08-30: qty 1

## 2020-08-30 MED ORDER — MIDAZOLAM HCL 2 MG/2ML IJ SOLN
INTRAMUSCULAR | Status: AC
  Filled 2020-08-30: qty 4

## 2020-08-30 MED ORDER — MIDAZOLAM HCL 2 MG/2ML IJ SOLN
INTRAMUSCULAR | Status: AC | PRN
  Administered 2020-08-30 (×4): 1 mg via INTRAVENOUS

## 2020-08-30 MED ORDER — LIDOCAINE HCL 1 % IJ SOLN
INTRAMUSCULAR | Status: AC
  Filled 2020-08-30: qty 20

## 2020-08-30 MED ORDER — FENTANYL CITRATE (PF) 100 MCG/2ML IJ SOLN
INTRAMUSCULAR | Status: AC
  Filled 2020-08-30: qty 4

## 2020-08-30 MED ORDER — LIDOCAINE HCL 1 % IJ SOLN
INTRAMUSCULAR | Status: AC | PRN
  Administered 2020-08-30: 10 mL via INTRADERMAL

## 2020-08-30 MED ORDER — PROPOFOL 10 MG/ML IV BOLUS
INTRAVENOUS | Status: DC | PRN
  Administered 2020-08-30: 30 mg via INTRAVENOUS
  Administered 2020-08-30: 20 mg via INTRAVENOUS

## 2020-08-30 MED ORDER — SODIUM CHLORIDE 0.9 % IV SOLN
3.0000 g | Freq: Four times a day (QID) | INTRAVENOUS | Status: DC
  Administered 2020-08-30 – 2020-09-04 (×21): 3 g via INTRAVENOUS
  Filled 2020-08-30: qty 3
  Filled 2020-08-30: qty 8
  Filled 2020-08-30 (×6): qty 3
  Filled 2020-08-30: qty 8
  Filled 2020-08-30 (×5): qty 3
  Filled 2020-08-30 (×2): qty 8
  Filled 2020-08-30: qty 3
  Filled 2020-08-30: qty 8
  Filled 2020-08-30: qty 3
  Filled 2020-08-30: qty 8
  Filled 2020-08-30: qty 3
  Filled 2020-08-30: qty 8
  Filled 2020-08-30: qty 3

## 2020-08-30 MED ORDER — FENTANYL CITRATE (PF) 100 MCG/2ML IJ SOLN
INTRAMUSCULAR | Status: AC | PRN
  Administered 2020-08-30 (×4): 50 ug via INTRAVENOUS

## 2020-08-30 MED ORDER — DIPHENHYDRAMINE HCL 50 MG/ML IJ SOLN
INTRAMUSCULAR | Status: AC
  Filled 2020-08-30: qty 1

## 2020-08-30 NOTE — Procedures (Signed)
Interventional Radiology Procedure Note  Procedure: Port removal  Complications: None  Estimated Blood Loss: < 10 mL  Findings: Right chest port removed in entirety. Wound closed.  Braley Luckenbaugh T. Monserrate Blaschke, M.D Pager:  [phone]    

## 2020-08-30 NOTE — Progress Notes (Signed)
PHARMACY - PHYSICIAN COMMUNICATION CRITICAL VALUE ALERT - BLOOD CULTURE IDENTIFICATION (BCID)  Martin [name] is an 30 y.o. male who presented to [facility] on 08/28/2020 with Enterococcus bacteremia  Assessment:  Enterococcus faecalis (no resistance detected) in 1/4 bottles   Name of physician (or Provider) Contacted: Dr Margo Aye  Current antibiotics: Daptomycin 500mg  IV q24h  Changes to prescribed antibiotics recommended:  No recommended changes to antibiotics at this time. Patient being followed by ID.    Results for orders placed or performed during the hospital encounter of 08/28/20  Blood Culture ID Panel (Reflexed) (Collected: 08/28/2020 10:14 PM)  Result Value Ref Range   Enterococcus faecalis DETECTED (A) NOT DETECTED   Enterococcus Faecium NOT DETECTED NOT DETECTED   Listeria monocytogenes NOT DETECTED NOT DETECTED   Staphylococcus species NOT DETECTED NOT DETECTED   Staphylococcus aureus (BCID) NOT DETECTED NOT DETECTED   Staphylococcus epidermidis NOT DETECTED NOT DETECTED   Staphylococcus lugdunensis NOT DETECTED NOT DETECTED   Streptococcus species NOT DETECTED NOT DETECTED   Streptococcus agalactiae NOT DETECTED NOT DETECTED   Streptococcus pneumoniae NOT DETECTED NOT DETECTED   Streptococcus pyogenes NOT DETECTED NOT DETECTED   A.calcoaceticus-baumannii NOT DETECTED NOT DETECTED   Bacteroides fragilis NOT DETECTED NOT DETECTED   Enterobacterales NOT DETECTED NOT DETECTED   Enterobacter cloacae complex NOT DETECTED NOT DETECTED   Escherichia coli NOT DETECTED NOT DETECTED   Klebsiella aerogenes NOT DETECTED NOT DETECTED   Klebsiella oxytoca NOT DETECTED NOT DETECTED   Klebsiella pneumoniae NOT DETECTED NOT DETECTED   Proteus species NOT DETECTED NOT DETECTED   Salmonella species NOT DETECTED NOT DETECTED   Serratia marcescens NOT DETECTED NOT DETECTED   Haemophilus influenzae NOT DETECTED NOT DETECTED   Neisseria meningitidis NOT DETECTED NOT DETECTED    Pseudomonas aeruginosa NOT DETECTED NOT DETECTED   Stenotrophomonas maltophilia NOT DETECTED NOT DETECTED   Candida albicans NOT DETECTED NOT DETECTED   Candida auris NOT DETECTED NOT DETECTED   Candida glabrata NOT DETECTED NOT DETECTED   Candida krusei NOT DETECTED NOT DETECTED   Candida parapsilosis NOT DETECTED NOT DETECTED   Candida tropicalis NOT DETECTED NOT DETECTED   Cryptococcus neoformans/gattii NOT DETECTED NOT DETECTED   Vancomycin resistance NOT DETECTED NOT DETECTED    10/28/2020, PharmD 08/30/2020  3:58 AM

## 2020-08-30 NOTE — Progress Notes (Signed)
    [facility]   Reason for visit: Follow up on Enterococcal bacteremia  Interval History: TEE negative for vegetation.  Afebrile, WBC 10.7.  Physical Exam: Constitutional:  Vitals:   08/30/20 1411 08/30/20 1421  BP: 113/61 103/63  Pulse: 88 92  Resp: 13 15  Temp:    SpO2: 94% 97%   patient appears in NAD  Impression: Enterococcal line infection.    Plan: 1. TEE done.  2.  Port removal won't be done until Monday per IR 3. Repeat blood cultures  4.  Ampicillin/sulbactam (had one blood culture positive for MSSA so will keep him on the combination). Will need two weeks of treatment after removal of the port [facility] for picc line 24 hours after port removal if repeat blood cultures remain negative  Dr. Daiva Eves available over the weekend if needed, otherwise I will follow up on Monday

## 2020-08-30 NOTE — Anesthesia Procedure Notes (Signed)
Procedure Name: MAC Date/Time: 08/30/2020 1:35 PM Performed by: Trinna Post., CRNA Pre-anesthesia Checklist: Patient identified, Emergency Drugs available, Suction available, Patient being monitored and Timeout performed Patient Re-evaluated:Patient Re-evaluated prior to induction Oxygen Delivery Method: Nasal cannula Preoxygenation: Pre-oxygenation with 100% oxygen Induction Type: IV induction Placement Confirmation: positive ETCO2

## 2020-08-30 NOTE — Progress Notes (Incomplete)
Introduction:   History of present illness: Martin [name] is a 30 YO male presenting to ED on 5/9 with sickle cell pain crisis with pain in back and legs. Left AMA for "family emergency" and returned to ED the next day after being called for positive blood cultures.    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 mother and father - allergies: ketorolac tromethamine (swelling in face and arms), tape (rash)   PTA meds: Triumec 300 mg tablet 1x daily for HIV Eliquis 5 mg PO 2x daily for DVT/PE Zyrtec for allergies Hydroxyurea 500mg   4 capsules PO for sickle cell (makes RBC rounder) Multivitamin Percocet 325 mg 1 tablet every 4 hours PRN for up to 15 days Trazodone 100 mg tablet for sleep Vitamin D 1.25 mg 1 capsule by mouth every 7 days    Social hx:  denies smoking, but vapes and uses marijuana some days, denies alcohol use  Physical exam/relevant labs, diagnostic testing/imaging:  Imaging - Chest X ray: Martin [name] Chest 2 View - stable right chest power port, no PE, bilateral lower lung opacity  - Admitted due to acute chest syndrome, further evaluation and treatment   Cultures - 5/11 blood culture showed enterococcus faecalis, blood culture of portacath was enterococcus faecalis and s. aureus, no s. aureus on blood   No fever ( 98.6F) CrCl 129.9 mL/min  Holding hydroxyurea (hydrea) Criteria  ANC < 2  PLT < 80 (sickle cell specific, 100 usually) - PT was 295  HgB < 6 (sickle cell specific, 8 in other pt) - PT was 8.8 Reticulocytes < 80L when Hgb < 9 - PT was 256 Meets no hold criteria   Problem based assessment and plan:  - Problem 1: Acute Chest Syndrome  A. Assessment: -Patient afebrile  -Admitted due to acute chest syndrome, further evaluation and treatment  -Given IV antibiotics of Rocephin and Zithromax for one dose before  leaving AMA -Placed on oxygen and admitted  -Disappeared AMA, called back after finding cultures grew staph aureus (5/10) from potentially port but not sure and returned on 5/11 -No murmur heard on auscultation to suggest endocarditis, continuing to have chest pain, may get echo -Urinalysis: unremarkable  -Had had previous portacath removed in past due to infection, this new one was placed may 2021  B. Plan: -Changing antibiotics to daptomycin 500 mg every 24 hours after readmission, considered ampicillin but staying on dapto for now -Will need TEE (transesophageal echocardiogram) and port removal on 5/13   - Problem 2: Sickle Cell anemia with pain  A. Assessment:  -Onset 24h ago -Took hydroxyurea and 10 mg Percocet on 5/8 prior to admission but no improvement of symptoms  -Hypoxic, O2 stats in 80s 5/9 - severe pain in legs and lower back  B. Plan: -Treated with IV dilaudid (hydromorphone) with no relief in ER -Continuing oral oxycodone, allergy to Toradol so will not use  -5/12 O2 up to 99% saturation, pain 9/10   - Problem 3: HIV  A. Assessment: asymptomatic  B. Plan: Continue on medication Triumeq (abacavir-dolutegravir-lamivudine 600-50-300) PO once daily  - Problem 4: PE chronic  A. Assessment: noncompliant with medications, resumed this visit  B: Plan-Continue on anticoag Eliquis 5 mg PO BID  - Problem 5: Anemia of Chronic Disease  A: Hgb stable so far (8.8 on 5/12) Uptrending leukocytosis (was 9.3 normal on 5/8, 12.6 on 5/12)

## 2020-08-30 NOTE — Anesthesia Preprocedure Evaluation (Signed)
Anesthesia Evaluation  Patient identified by MRN, date of birth, ID band Patient awake    Reviewed: Allergy & Precautions, H&P , NPO status , Patient's Chart, lab work & pertinent test results  Airway Mallampati: II   Neck ROM: full    Dental   Pulmonary neg pulmonary ROS,    breath sounds clear to auscultation       Cardiovascular  Rhythm:regular Rate:Normal  bacteremia   Neuro/Psych PSYCHIATRIC DISORDERS Anxiety    GI/Hepatic GERD  ,  Endo/Other    Renal/GU      Musculoskeletal   Abdominal   Peds  Hematology  (+) Blood dyscrasia, Sickle cell anemia and anemia , HIV,   Anesthesia Other Findings   Reproductive/Obstetrics                             Anesthesia Physical Anesthesia Plan  ASA: II  Anesthesia Plan: MAC   Post-op Pain Management:    Induction: Intravenous  PONV Risk Score and Plan: 1 and Propofol infusion and Treatment may vary due to age or medical condition  Airway Management Planned: Nasal Cannula  Additional Equipment:   Intra-op Plan:   Post-operative Plan:   Informed Consent: I have reviewed the patients History and Physical, chart, labs and discussed the procedure including the risks, benefits and alternatives for the proposed anesthesia with the patient or authorized representative who has indicated his/her understanding and acceptance.     Dental advisory given  Plan Discussed with: CRNA, Anesthesiologist and Surgeon  Anesthesia Plan Comments:         Anesthesia Quick Evaluation

## 2020-08-30 NOTE — Interval H&P Note (Signed)
History and Physical Interval Note:  08/30/2020 1:01 PM  Martin [name]  has presented today for surgery, with the diagnosis of BACTEREMIA.  The various methods of treatment have been discussed with the patient and family. After consideration of risks, benefits and other options for treatment, the patient has consented to  Procedure(s): TRANSESOPHAGEAL ECHOCARDIOGRAM (TEE) (N/A) as a surgical intervention.  The patient's history has been reviewed, patient examined, no change in status, stable for surgery.  I have reviewed the patient's chart and labs.  Questions were answered to the patient's satisfaction.     [name]

## 2020-08-30 NOTE — Progress Notes (Signed)
  Echocardiogram Echocardiogram Transesophageal has been performed.  Martin [name] 08/30/2020, 2:08 PM

## 2020-08-30 NOTE — CV Procedure (Signed)
TRANSESOPHAGEAL ECHOCARDIOGRAM (TEE) NOTE  INDICATIONS: infective endocarditis  PROCEDURE:   Informed consent was obtained prior to the procedure. The risks, benefits and alternatives for the procedure were discussed and the patient comprehended these risks.  Risks include, but are not limited to, cough, sore throat, vomiting, nausea, somnolence, esophageal and stomach trauma or perforation, bleeding, low blood pressure, aspiration, pneumonia, infection, trauma to the teeth and death.    After a procedural time-out, the patient was given propofol per anesthesia for sedation.  The patient's heart rate, blood pressure, and oxygen saturation are monitored continuously during the procedure. The transesophageal probe was inserted in the esophagus and stomach without difficulty and multiple views were obtained.  The patient was kept under observation until the patient left the procedure room. I was present face-to-face 100% of this time. The patient left the procedure room in stable condition.   Agitated microbubble saline contrast was not administered.  COMPLICATIONS:    There were no immediate complications.  Findings:  1. LEFT VENTRICLE: The left ventricular wall thickness is normal.  The left ventricular cavity is normal in size. Wall motion is normal.  LVEF is 60-65%.  2. RIGHT VENTRICLE:  The right ventricle is normal in structure and function without any thrombus or masses.    3. LEFT ATRIUM:  The left atrium is normal in size without any thrombus or masses.  There is not spontaneous echo contrast ("smoke") in the left atrium consistent with a low flow state.  4. LEFT ATRIAL APPENDAGE:  The large left atrial appendage is free of any thrombus or masses. The appendage has single lobes. Pulse doppler indicates high flow in the appendage.  5. ATRIAL SEPTUM:  The atrial septum appears intact and is free of thrombus and/or masses.  There is no evidence for interatrial shunting by color  doppler and saline microbubble.  6. RIGHT ATRIUM:  The right atrium is normal in size and function without any thrombus or masses. A catheter is noted in the right atrium - there is no evidence for adherent vegetation.  7. MITRAL VALVE:  The mitral valve is normal in structure and function with trivial regurgitation.  There were no vegetations or stenosis.  8. AORTIC VALVE:  The aortic valve is trileaflet, normal in structure and function with no regurgitation.  There were no vegetations or stenosis  9. TRICUSPID VALVE:  The tricuspid valve is normal in structure and function with trivial regurgitation.  There were no vegetations or stenosis  10.  PULMONIC VALVE:  The pulmonic valve is normal in structure and function with no regurgitation.  There were no vegetations or stenosis.   11. AORTIC ARCH, ASCENDING AND DESCENDING AORTA:  There was no Myrtis Ser et. Al, 1992) atherosclerosis of the ascending aorta, aortic arch, or proximal descending aorta.  12. PULMONARY VEINS: Anomalous pulmonary venous return was not noted.  13. PERICARDIUM: The pericardium appeared normal and non-thickened.  There is no pericardial effusion.  IMPRESSION:   1. No evidence for endocarditis. 2. Catheter well-visualized and does not demonstrate vegetation 3. No LAA thrombus 4. LVEF 60-65%, normal wall motion  RECOMMENDATIONS:    1.  No evidence of endocarditis or cathter-associated vegetation  Time Spent Directly with the Patient:  60 minutes   [name], MD, [facility], FACP  [place]  [facility]  Medical Director of the [facility] Diplomate of the American Board of Clinical Lipidology Attending Cardiologist  Direct Dial: [phone]  Fax:  [phone]  Website:  [email or web address] 08/30/2020, 1:59 PM

## 2020-08-30 NOTE — Progress Notes (Signed)
Subjective: Martin [name] is a 30 year old male with a medical history significant for sickle cell disease, chronic pain syndrome, opiate dependence and tolerance, history of HIV disease, generalized anxiety, and anemia was admitted for bacteremia in the setting of sickle cell pain crisis. Today, patient states that he feels "miserable".  Pain has not been controlled with medication.  Pain is primarily chest, back, he rates his pain as 9/10 despite IV Dilaudid PCA. Blood cultures grew Enterococcus, patient continued on IV antibiotics and is followed by infectious disease.  Plan is to remove patient's Port-A-Cath today.  Patient is scheduled for TEE today to rule out vegetation.  Objective:  Vital signs in last 24 hours:  Vitals:   08/30/20 1401 08/30/20 1411 08/30/20 1421 08/30/20 1608  BP: (!) 104/53 113/61 103/63 117/75  Pulse: 99 88 92 88  Resp: 12 13 15 13   Temp: 98.4 F (36.9 C)     TempSrc: Temporal     SpO2: 97% 94% 97% 100%  Weight:      Height:        Intake/Output from previous day:   Intake/Output Summary (Last 24 hours) at 08/30/2020 1615 Last data filed at 08/30/2020 1355 Gross per 24 hour  Intake 360 ml  Output 1550 ml  Net -1190 ml    Physical Exam: General: Alert, awake, oriented x3, in no acute distress.  HEENT: /AT PEERL, EOMI Neck: Trachea midline,  no masses, no thyromegal,y no JVD, no carotid bruit OROPHARYNX:  Moist, No exudate/ erythema/lesions.  Heart: Regular rate and rhythm, without murmurs, rubs, gallops, PMI non-displaced, no heaves or thrills on palpation.  Lungs: Clear to auscultation, no wheezing or rhonchi noted. No increased vocal fremitus resonant to percussion  Abdomen: Soft, nontender, nondistended, positive bowel sounds, no masses no hepatosplenomegaly noted..  Neuro: No focal neurological deficits noted cranial nerves II through XII grossly intact. DTRs 2+ bilaterally upper and lower extremities. Strength 5 out of 5 in bilateral upper  and lower extremities. Musculoskeletal: No warm swelling or erythema around joints, no spinal tenderness noted. Psychiatric: Patient alert and oriented x3, good insight and cognition, good recent to remote recall. Lymph node survey: No cervical axillary or inguinal lymphadenopathy noted.  Lab Results:  Basic Metabolic Panel:    Component Value Date/Time   NA 138 08/28/2020 2338   NA 136 10/17/2018 1433   K 4.3 08/28/2020 2338   CL 106 08/28/2020 2338   CO2 27 08/28/2020 2338   BUN 8 08/28/2020 2338   BUN 6 10/17/2018 1433   CREATININE 0.74 08/28/2020 2338   GLUCOSE 88 08/28/2020 2338   CALCIUM 8.9 08/28/2020 2338   CBC:    Component Value Date/Time   WBC 10.7 (H) 08/30/2020 1134   HGB 8.8 (L) 08/30/2020 1134   HGB 9.6 (L) 10/17/2018 1433   HCT 24.2 (L) 08/30/2020 1134   HCT 26.2 (L) 10/17/2018 1433   PLT 323 08/30/2020 1134   PLT 297 10/17/2018 1433   MCV 95.7 08/30/2020 1134   MCV 108 (H) 10/17/2018 1433   NEUTROABS 2.9 08/30/2020 1134   NEUTROABS 7.0 10/17/2018 1433   LYMPHSABS 6.6 (H) 08/30/2020 1134   LYMPHSABS 4.6 (H) 10/17/2018 1433   MONOABS 1.0 08/30/2020 1134   EOSABS 0.2 08/30/2020 1134   EOSABS 0.2 10/17/2018 1433   BASOSABS 0.0 08/30/2020 1134   BASOSABS 0.1 10/17/2018 1433    Recent Results (from the past 240 hour(s))  Culture, blood (routine x 2)     Status: Abnormal  Collection Time: 08/26/20  6:38 AM   Specimen: BLOOD  Result Value Ref Range Status   Specimen Description   Final    BLOOD PORTA CATH Performed at [facility], [address], [place], [place] [address]    Special Requests   Final    BOTTLES DRAWN AEROBIC AND ANAEROBIC Blood Culture adequate volume Performed at [facility], [address] [place], [place] [address]    Culture  Setup Time   Final    GRAM POSITIVE COCCI IN CHAINS IN BOTH AEROBIC AND ANAEROBIC BOTTLES CRITICAL RESULT CALLED TO, READ BACK BY AND VERIFIED WITH: DR Hart [place] 0126  [id number] FCP    Culture (A)  Final    ENTEROCOCCUS FAECALIS STAPHYLOCOCCUS AUREUS CRITICAL RESULT CALLED TO, READ BACK BY AND VERIFIED WITH: RN K.FREEMAN AT 1157 ON 08/27/2020 BY T.SAAD Performed at [facility], [address] [place], [place] [address]    Report Status 08/30/2020 FINAL  Final   Organism ID, Bacteria ENTEROCOCCUS FAECALIS  Final   Organism ID, Bacteria STAPHYLOCOCCUS AUREUS  Final      Susceptibility   Enterococcus faecalis - MIC*    AMPICILLIN <=2 SENSITIVE Sensitive     VANCOMYCIN 1 SENSITIVE Sensitive     GENTAMICIN SYNERGY SENSITIVE Sensitive     * ENTEROCOCCUS FAECALIS   Staphylococcus aureus - MIC*    CIPROFLOXACIN <=0.5 SENSITIVE Sensitive     ERYTHROMYCIN >=8 RESISTANT Resistant     GENTAMICIN <=0.5 SENSITIVE Sensitive     OXACILLIN <=0.25 SENSITIVE Sensitive     TETRACYCLINE <=1 SENSITIVE Sensitive     VANCOMYCIN 1 SENSITIVE Sensitive     TRIMETH/SULFA <=10 SENSITIVE Sensitive     CLINDAMYCIN <=0.25 SENSITIVE Sensitive     RIFAMPIN <=0.5 SENSITIVE Sensitive     Inducible Clindamycin NEGATIVE Sensitive     * STAPHYLOCOCCUS AUREUS  Blood Culture ID Panel (Reflexed)     Status: Abnormal   Collection Time: 08/26/20  6:38 AM  Result Value Ref Range Status   Enterococcus faecalis DETECTED (A) NOT DETECTED Final    Comment: CRITICAL RESULT CALLED TO, READ BACK BY AND VERIFIED WITH: DR Corie Vavra [phone] FCP    Enterococcus Faecium NOT DETECTED NOT DETECTED Final   Listeria monocytogenes NOT DETECTED NOT DETECTED Final   Staphylococcus species NOT DETECTED NOT DETECTED Final   Staphylococcus aureus (BCID) NOT DETECTED NOT DETECTED Final   Staphylococcus epidermidis NOT DETECTED NOT DETECTED Final   Staphylococcus lugdunensis NOT DETECTED NOT DETECTED Final   Streptococcus species NOT DETECTED NOT DETECTED Final   Streptococcus agalactiae NOT DETECTED NOT DETECTED Final   Streptococcus pneumoniae NOT DETECTED NOT DETECTED Final   Streptococcus  pyogenes NOT DETECTED NOT DETECTED Final   A.calcoaceticus-baumannii NOT DETECTED NOT DETECTED Final   Bacteroides fragilis NOT DETECTED NOT DETECTED Final   Enterobacterales NOT DETECTED NOT DETECTED Final   Enterobacter cloacae complex NOT DETECTED NOT DETECTED Final   Escherichia coli NOT DETECTED NOT DETECTED Final   Klebsiella aerogenes NOT DETECTED NOT DETECTED Final   Klebsiella oxytoca NOT DETECTED NOT DETECTED Final   Klebsiella pneumoniae NOT DETECTED NOT DETECTED Final   Proteus species NOT DETECTED NOT DETECTED Final   Salmonella species NOT DETECTED NOT DETECTED Final   Serratia marcescens NOT DETECTED NOT DETECTED Final   Haemophilus influenzae NOT DETECTED NOT DETECTED Final   Neisseria meningitidis NOT DETECTED NOT DETECTED Final   Pseudomonas aeruginosa NOT DETECTED NOT DETECTED Final   Stenotrophomonas maltophilia NOT DETECTED NOT  DETECTED Final   Candida albicans NOT DETECTED NOT DETECTED Final   Candida auris NOT DETECTED NOT DETECTED Final   Candida glabrata NOT DETECTED NOT DETECTED Final   Candida krusei NOT DETECTED NOT DETECTED Final   Candida parapsilosis NOT DETECTED NOT DETECTED Final   Candida tropicalis NOT DETECTED NOT DETECTED Final   Cryptococcus neoformans/gattii NOT DETECTED NOT DETECTED Final   Meth resistant mecA/C and MREJ NOT DETECTED NOT DETECTED Final   Vancomycin resistance NOT DETECTED NOT DETECTED Final    Comment: Performed at [facility], [address] [place], [place] [address]  Resp Panel by RT-PCR (Flu A&B, Covid) Nasopharyngeal Swab     Status: None   Collection Time: 08/26/20  6:39 AM   Specimen: Nasopharyngeal Swab; Nasopharyngeal(NP) swabs in vial transport medium  Result Value Ref Range Status   SARS Coronavirus 2 by RT PCR NEGATIVE NEGATIVE Final    Comment: (NOTE) SARS-CoV-2 target nucleic acids are NOT DETECTED.  The SARS-CoV-2 RNA is generally detectable in upper respiratory specimens during the acute phase of  infection. The lowest concentration of SARS-CoV-2 viral copies this assay can detect is 138 copies/mL. A negative result does not preclude SARS-Cov-2 infection and should not be used as the sole basis for treatment or other patient management decisions. A negative result may occur with  improper specimen collection/handling, submission of specimen other than nasopharyngeal swab, presence of viral mutation(s) within the areas targeted by this assay, and inadequate number of viral copies(<138 copies/mL). A negative result must be combined with clinical observations, patient history, and epidemiological information. The expected result is Negative.  Fact Sheet for Patients:  BloggerCourse.com  Fact Sheet for Healthcare Providers:  SeriousBroker.it  This test is no t yet approved or cleared by the [place] FDA and  has been authorized for detection and/or diagnosis of SARS-CoV-2 by FDA under an Emergency Use Authorization (EUA). This EUA will remain  in effect (meaning this test can be used) for the duration of the COVID-19 declaration under Section 564(b)(1) of the Act, 21 U.S.C.section 360bbb-3(b)(1), unless the authorization is terminated  or revoked sooner.       Influenza A by PCR NEGATIVE NEGATIVE Final   Influenza B by PCR NEGATIVE NEGATIVE Final    Comment: (NOTE) The Xpert Xpress SARS-CoV-2/FLU/RSV plus assay is intended as an aid in the diagnosis of influenza from Nasopharyngeal swab specimens and should not be used as a sole basis for treatment. Nasal washings and aspirates are unacceptable for Xpert Xpress SARS-CoV-2/FLU/RSV testing.  Fact Sheet for Patients: BloggerCourse.com  Fact Sheet for Healthcare Providers: SeriousBroker.it  This test is not yet approved or cleared by the [place] FDA and has been authorized for detection and/or diagnosis of SARS-CoV-2  by FDA under an Emergency Use Authorization (EUA). This EUA will remain in effect (meaning this test can be used) for the duration of the COVID-19 declaration under Section 564(b)(1) of the Act, 21 U.S.C. section 360bbb-3(b)(1), unless the authorization is terminated or revoked.  Performed at [facility], [address], [place], [place] [address]   Culture, blood (routine x 2)     Status: Abnormal   Collection Time: 08/26/20  6:43 AM   Specimen: BLOOD RIGHT HAND  Result Value Ref Range Status   Specimen Description   Final    BLOOD RIGHT HAND Performed at [facility], [address] [place], [place] [address]    Special Requests   Final    BOTTLES  DRAWN AEROBIC AND ANAEROBIC Blood Culture results may not be optimal due to an inadequate volume of blood received in culture bottles Performed at [facility], [address] [place], [place] [address]    Culture  Setup Time   Final    GRAM POSITIVE COCCI IN CHAINS IN BOTH AEROBIC AND ANAEROBIC BOTTLES    Culture (A)  Final    ENTEROCOCCUS FAECALIS SUSCEPTIBILITIES PERFORMED ON PREVIOUS CULTURE WITHIN THE LAST 5 DAYS. Performed at [facility], [address], [place], [place] [address]    Report Status 08/29/2020 FINAL  Final  Blood culture (routine x 2)     Status: None (Preliminary result)   Collection Time: 08/28/20 10:14 PM   Specimen: BLOOD  Result Value Ref Range Status   Specimen Description   Final    BLOOD RIGHT HAND Performed at [facility], [address], [place], [place] [address]    Special Requests   Final    BOTTLES DRAWN AEROBIC AND ANAEROBIC Blood Culture adequate volume Performed at [facility], [address], [place], [place] [address]    Culture  Setup Time   Final    GRAM POSITIVE COCCI IN CHAINS AEROBIC BOTTLE ONLY Organism ID to follow CRITICAL RESULT CALLED TO, READ BACK BY AND VERIFIED WITH:  PHARMD Sheppard Penton POINDEXTER BY MESSAN H. AT 0348 ON 08/30/2020    Culture   Final    NO GROWTH 1 DAY Performed at [facility], [address], [place], [place] [address]    Report Status PENDING  Incomplete  Blood Culture ID Panel (Reflexed)     Status: Abnormal   Collection Time: 08/28/20 10:14 PM  Result Value Ref Range Status   Enterococcus faecalis DETECTED (A) NOT DETECTED Final    Comment: CRITICAL RESULT CALLED TO, READ BACK BY AND VERIFIED WITH: PHARMD Sheppard Penton [facility] BY [facility] AT 1308 ON 08/30/2020    Enterococcus Faecium NOT DETECTED NOT DETECTED Final   Listeria monocytogenes NOT DETECTED NOT DETECTED Final   Staphylococcus species NOT DETECTED NOT DETECTED Final   Staphylococcus aureus (BCID) NOT DETECTED NOT DETECTED Final   Staphylococcus epidermidis NOT DETECTED NOT DETECTED Final   Staphylococcus lugdunensis NOT DETECTED NOT DETECTED Final   Streptococcus species NOT DETECTED NOT DETECTED Final   Streptococcus agalactiae NOT DETECTED NOT DETECTED Final   Streptococcus pneumoniae NOT DETECTED NOT DETECTED Final   Streptococcus pyogenes NOT DETECTED NOT DETECTED Final   A.calcoaceticus-baumannii NOT DETECTED NOT DETECTED Final   Bacteroides fragilis NOT DETECTED NOT DETECTED Final   Enterobacterales NOT DETECTED NOT DETECTED Final   Enterobacter cloacae complex NOT DETECTED NOT DETECTED Final   Escherichia coli NOT DETECTED NOT DETECTED Final   Klebsiella aerogenes NOT DETECTED NOT DETECTED Final   Klebsiella oxytoca NOT DETECTED NOT DETECTED Final   Klebsiella pneumoniae NOT DETECTED NOT DETECTED Final   Proteus species NOT DETECTED NOT DETECTED Final   Salmonella species NOT DETECTED NOT DETECTED Final   Serratia marcescens NOT DETECTED NOT DETECTED Final   Haemophilus influenzae NOT DETECTED NOT DETECTED Final   Neisseria meningitidis NOT DETECTED NOT DETECTED Final   Pseudomonas aeruginosa NOT DETECTED NOT DETECTED Final   Stenotrophomonas maltophilia NOT  DETECTED NOT DETECTED Final   Candida albicans NOT DETECTED NOT DETECTED Final   Candida auris NOT DETECTED NOT DETECTED Final   Candida glabrata NOT DETECTED NOT DETECTED Final   Candida krusei NOT DETECTED NOT DETECTED Final   Candida parapsilosis  NOT DETECTED NOT DETECTED Final   Candida tropicalis NOT DETECTED NOT DETECTED Final   Cryptococcus neoformans/gattii NOT DETECTED NOT DETECTED Final   Vancomycin resistance NOT DETECTED NOT DETECTED Final    Comment: Performed at [facility], [address], [place], [place] [address]  Blood culture (routine x 2)     Status: None (Preliminary result)   Collection Time: 08/28/20 10:19 PM   Specimen: BLOOD  Result Value Ref Range Status   Specimen Description   Final    BLOOD LEFT HAND Performed at [facility] [address], [place], [place] [address]    Special Requests   Final    BOTTLES DRAWN AEROBIC ONLY Blood Culture results may not be optimal due to an inadequate volume of blood received in culture bottles Performed at [facility], [address], [place], [place] [address]    Culture   Final    NO GROWTH 1 DAY Performed at [facility], [address] [place], [place] [address]    Report Status PENDING  Incomplete  Resp Panel by RT-PCR (Flu A&B, Covid) Nasopharyngeal Swab     Status: None   Collection Time: 08/28/20 11:38 PM   Specimen: Nasopharyngeal Swab; Nasopharyngeal(NP) swabs in vial transport medium  Result Value Ref Range Status   SARS Coronavirus 2 by RT PCR NEGATIVE NEGATIVE Final    Comment: (NOTE) SARS-CoV-2 target nucleic acids are NOT DETECTED.  The SARS-CoV-2 RNA is generally detectable in upper respiratory specimens during the acute phase of infection. The lowest concentration of SARS-CoV-2 viral copies this assay can detect is 138 copies/mL. A negative result does not preclude SARS-Cov-2 infection and should not be used as the sole basis for treatment  or other patient management decisions. A negative result may occur with  improper specimen collection/handling, submission of specimen other than nasopharyngeal swab, presence of viral mutation(s) within the areas targeted by this assay, and inadequate number of viral copies(<138 copies/mL). A negative result must be combined with clinical observations, patient history, and epidemiological information. The expected result is Negative.  Fact Sheet for Patients:  BloggerCourse.com  Fact Sheet for Healthcare Providers:  SeriousBroker.it  This test is no t yet approved or cleared by the [place] FDA and  has been authorized for detection and/or diagnosis of SARS-CoV-2 by FDA under an Emergency Use Authorization (EUA). This EUA will remain  in effect (meaning this test can be used) for the duration of the COVID-19 declaration under Section 564(b)(1) of the Act, 21 U.S.C.section 360bbb-3(b)(1), unless the authorization is terminated  or revoked sooner.       Influenza A by PCR NEGATIVE NEGATIVE Final   Influenza B by PCR NEGATIVE NEGATIVE Final    Comment: (NOTE) The Xpert Xpress SARS-CoV-2/FLU/RSV plus assay is intended as an aid in the diagnosis of influenza from Nasopharyngeal swab specimens and should not be used as a sole basis for treatment. Nasal washings and aspirates are unacceptable for Xpert Xpress SARS-CoV-2/FLU/RSV testing.  Fact Sheet for Patients: BloggerCourse.com  Fact Sheet for Healthcare Providers: SeriousBroker.it  This test is not yet approved or cleared by the [place] FDA and has been authorized for detection and/or diagnosis of SARS-CoV-2 by FDA under an Emergency Use Authorization (EUA). This EUA will remain in effect (meaning this test can be used) for the duration of the COVID-19 declaration under Section 564(b)(1) of the Act, 21 U.S.C. section  360bbb-3(b)(1), unless the authorization is terminated or revoked.  Performed  at [facility], [address] [place], [place] [address]   Urine culture     Status: Abnormal   Collection Time: 08/29/20  2:45 AM   Specimen: Urine, Clean Catch  Result Value Ref Range Status   Specimen Description   Final    URINE, CLEAN CATCH Performed at [facility], [address] [place], [place] [address]    Special Requests   Final    NONE Performed at [facility], [address] [place], [place] [address]    Culture (A)  Final    <10,000 COLONIES/mL INSIGNIFICANT GROWTH Performed at [facility], [address], [place], [place] [address]    Report Status 08/30/2020 FINAL  Final    Studies/Results: DG Chest 2 View  Result Date: 08/28/2020 CLINICAL DATA:  Shortness of breath, sickle cell disease EXAM: CHEST - 2 VIEW COMPARISON:  08/26/2020 FINDINGS: Cardiac shadow is stable. Right chest wall port is again seen and stable. Previously seen patchy airspace opacities are again identified and stable. No new focal abnormality is noted. No bony abnormality is seen. IMPRESSION: Patchy bibasilar airspace opacities stable from the prior exam. Electronically Signed   By: Alcide Clever M.D.   On: 08/28/2020 22:40   ECHO TEE  Result Date: 08/30/2020    TRANSESOPHOGEAL ECHO REPORT   Patient Name:   DAYTON KENLEY Date of Exam: 08/30/2020 Medical Rec #:  [id number]           Height:       75.0 in Accession #:    [id number]          Weight:       150.0 lb Date of Birth:  Apr 06, 1991           BSA:          1.942 m Patient Age:    29 years            BP:           115/81 mmHg Patient Gender: M                   HR:           101 bpm. Exam Location:  Inpatient Procedure: Transesophageal Echo, Color Doppler and Cardiac Doppler Indications:     Bacteremia  History:         Patient has no prior history of Echocardiogram examinations.  Sonographer:      Eulah Pont RDCS Referring Phys:  [id number] JILL D MCDANIEL Diagnosing Phys: Zoila Shutter MD PROCEDURE: After discussion of the risks and benefits of a TEE, an informed consent was obtained from the patient. The transesophogeal probe was passed without difficulty through the esophogus of the patient. Sedation performed by different physician. The patient was monitored while under deep sedation. Anesthestetic sedation was provided intravenously by Anesthesiology:  of Propofol. The patient's vital signs; including heart rate, blood pressure, and oxygen saturation; remained stable throughout the procedure. The patient developed no complications during the procedure. IMPRESSIONS  1. Left ventricular ejection fraction, by estimation, is 60 to 65%. The left ventricle has normal function.  2. Right ventricular systolic function is normal. The right ventricular size is normal.  3. No left atrial/left atrial appendage thrombus was detected.  4. Right atrial size was mildly dilated. Catheter noted in the right atrium without vegetation.  5. The mitral valve is grossly normal. Trivial mitral valve regurgitation.  6. The aortic valve is tricuspid.  Aortic valve regurgitation is not visualized. Conclusion(s)/Recommendation(s): No evidence of vegetation/infective endocarditis on this transesophageal echocardiogram. FINDINGS  Left Ventricle: Left ventricular ejection fraction, by estimation, is 60 to 65%. The left ventricle has normal function. The left ventricular internal cavity size was normal in size. There is no left ventricular hypertrophy. Right Ventricle: The right ventricular size is normal. No increase in right ventricular wall thickness. Right ventricular systolic function is normal. Left Atrium: Left atrial size was normal in size. No left atrial/left atrial appendage thrombus was detected. Right Atrium: Right atrial size was mildly dilated. Catheter noted in the right atrium without vegetation. Pericardium: There  is no evidence of pericardial effusion. Mitral Valve: The mitral valve is grossly normal. Trivial mitral valve regurgitation. Tricuspid Valve: The tricuspid valve is grossly normal. Tricuspid valve regurgitation is trivial. Aortic Valve: The aortic valve is tricuspid. Aortic valve regurgitation is not visualized. Pulmonic Valve: The pulmonic valve was grossly normal. Pulmonic valve regurgitation is trivial. Aorta: The aortic root and ascending aorta are structurally normal, with no evidence of dilitation. IAS/Shunts: No atrial level shunt detected by color flow Doppler. Zoila Shutter MD Electronically signed by Zoila Shutter MD Signature Date/Time: 08/30/2020/3:01:04 PM    Final     Medications: Scheduled Meds: . abacavir-dolutegravir-lamiVUDine  1 tablet Oral QHS  . apixaban  5 mg Oral BID  . HYDROmorphone   Intravenous Q4H  .  HYDROmorphone (DILAUDID) injection  2 mg Intravenous Once  . hydroxyurea  2,000 mg Oral QHS  . lidocaine      . lidocaine      . multivitamin with minerals  1 tablet Oral Q breakfast  . polyethylene glycol  17 g Oral Daily  . senna-docusate  2 tablet Oral BID   Continuous Infusions: . ampicillin-sulbactam (UNASYN) IV    . diphenhydrAMINE 25 mg (08/30/20 1044)   PRN Meds:.ALPRAZolam, diphenhydrAMINE **OR** diphenhydrAMINE, diphenhydrAMINE, fentaNYL, lip balm, midazolam, naloxone **AND** sodium chloride flush, ondansetron  Consultants:  None  Procedures:  None  Antibiotics:  None  Assessment/Plan: Principal Problem:   Bacteremia due to Enterococcus Active Problems:   HIV (human immunodeficiency virus infection) (HCC)   History of pulmonary embolus (PE)   Sickle cell anemia with pain (HCC)  : Bacteremia: Blood cultures positive for Enterococcus faecalis.  Patient followed by infectious disease.  Continued on IV antibiotics scheduled to have Port-A-Cath removed today.  Also, TEE to rule out vegetation.  Patient is afebrile.  Continue to follow closely.   Will defer to infectious disease for further management  Sickle cell disease with pain crisis: Continue IV fluids, 0.45% saline 50 mL/h IV Dilaudid PCA, no changes in settings today Restart home medications, Percocet 10-325 mg every 4 hours as needed for severe breakthrough pain Hold Toradol due to listed allergy Monitor vital signs very closely, reevaluate pain scale regularly, and supplemental oxygen as needed patient will be reevaluated for pain in the context of function and relationship to baseline as care progresses  Chronic pain syndrome: Continue home medication  Anemia of chronic disease: Hemoglobin 8.8, which is consistent with patient's baseline.  There is no clinical indication for blood transfusion at this time.  Continue to follow closely.  History of PE: Continue Eliquis  History of HIV: Continue HAART therapy.  Follow by infectious disease as an outpatient    Code Status: Full Code Family Communication: N/A Disposition Plan: Not yet ready for discharge  Genevie Elman Rennis Petty  APRN, [name], [name] [facility] [address]  [place], [place] [address] [phone]  If 7PM-7AM, please contact night-coverage.  08/30/2020, 4:16 PM  LOS: 1 day

## 2020-08-30 NOTE — Transfer of Care (Signed)
Immediate Anesthesia Transfer of Care Note  Patient: Lawrnce X Bates  Procedure(s) Performed: TRANSESOPHAGEAL ECHOCARDIOGRAM (TEE) (N/A )  Patient Location: PACU and Endoscopy Unit  Anesthesia Type:MAC  Level of Consciousness: awake, alert  and oriented  Airway & Oxygen Therapy: Patient Spontanous Breathing and Patient connected to nasal cannula oxygen  Post-op Assessment: Report given to RN and Post -op Vital signs reviewed and stable  Post vital signs: Reviewed and stable  Last Vitals:  Vitals Value Taken Time  BP 104/53 08/30/20 1401  Temp    Pulse 99 08/30/20 1401  Resp 12 08/30/20 1401  SpO2 97 % 08/30/20 1401  Vitals shown include unvalidated device data.  Last Pain:  Vitals:   08/30/20 1311  TempSrc: Temporal  PainSc: 8       Patients Stated Pain Goal: 0 (16/10/96 0454)  Complications: No complications documented.

## 2020-08-31 DIAGNOSIS — Z86711 Personal history of pulmonary embolism: Secondary | ICD-10-CM | POA: Diagnosis not present

## 2020-08-31 DIAGNOSIS — B20 Human immunodeficiency virus [HIV] disease: Secondary | ICD-10-CM

## 2020-08-31 DIAGNOSIS — D5700 Hb-SS disease with crisis, unspecified: Secondary | ICD-10-CM

## 2020-08-31 DIAGNOSIS — R7881 Bacteremia: Secondary | ICD-10-CM | POA: Diagnosis not present

## 2020-08-31 MED ORDER — FLEET ENEMA 7-19 GM/118ML RE ENEM
1.0000 | ENEMA | Freq: Once | RECTAL | Status: AC
  Administered 2020-08-31: 1 via RECTAL
  Filled 2020-08-31: qty 1

## 2020-08-31 NOTE — Anesthesia Postprocedure Evaluation (Signed)
Anesthesia Post Note  Patient: Martin [name]  Procedure(s) Performed: TRANSESOPHAGEAL ECHOCARDIOGRAM (TEE) (N/A )     Patient location during evaluation: Endoscopy Anesthesia Type: MAC Level of consciousness: awake and alert Pain management: pain level controlled Vital Signs Assessment: post-procedure vital signs reviewed and stable Respiratory status: spontaneous breathing, nonlabored ventilation, respiratory function stable and patient connected to nasal cannula oxygen Cardiovascular status: stable and blood pressure returned to baseline Postop Assessment: no apparent nausea or vomiting Anesthetic complications: no   No complications documented.  Last Vitals:  Vitals:   08/31/20 0437 08/31/20 0735  BP: 107/73   Pulse: 93   Resp: 14 15  Temp: 36.5 C   SpO2: (!) 86% 93%    Last Pain:  Vitals:   08/31/20 0735  TempSrc:   PainSc: [place]

## 2020-08-31 NOTE — Progress Notes (Signed)
Patient ID: Martin [name], male   DOB: 07/24/1990, 30 y.o.   MRN: [id number] Subjective: Martin [name] is a 30 year old male with a medical history significant for sickle cell disease, chronic pain syndrome, opiate dependence and tolerance, history of HIV disease, generalized anxiety, and anemia was admitted for bacteremia in the setting of sickle cell pain crisis.  Patient feels his pain is getting better, although still at 7/10 as against his goal of below 5/10.  Patient is on antibiotics for enterococci bacteremia.  Port-A-Cath was removed yesterday, TEE was done with no vegetation.  Fever has subsided.  Patient denies any cough or chest pain, he denies any nausea, vomiting or diarrhea. Patient is requesting an enema.  Objective:  Vital signs in last 24 hours:  Vitals:   08/31/20 1020 08/31/20 1153 08/31/20 1354 08/31/20 1420  BP: 106/72  109/70   Pulse: 82  96 88  Resp: 20 (!) 21 20   Temp: 97.8 F (36.6 C)  98.6 F (37 C)   TempSrc: Oral  Oral   SpO2: (!) 89% 90% (!) 85% 94%  Weight:      Height:        Intake/Output from previous day:   Intake/Output Summary (Last 24 hours) at 08/31/2020 1456 Last data filed at 08/31/2020 1300 Gross per 24 hour  Intake 1650 ml  Output 3550 ml  Net -1900 ml    Physical Exam: General: Alert, awake, oriented x3, in no acute distress.  Anxious looking. HEENT: [place]/AT PEERL, EOMI Neck: Trachea midline,  no masses, no thyromegal,y no JVD, no carotid bruit OROPHARYNX:  Moist, No exudate/ erythema/lesions.  Heart: Regular rate and rhythm, without murmurs, rubs, gallops, PMI non-displaced, no heaves or thrills on palpation.  Lungs: Clear to auscultation, no wheezing or rhonchi noted. No increased vocal fremitus resonant to percussion  Abdomen: Soft, nontender, nondistended, positive bowel sounds, no masses no hepatosplenomegaly noted..  Neuro: No focal neurological deficits noted cranial nerves II through XII grossly intact. DTRs 2+  bilaterally upper and lower extremities. Strength 5 out of 5 in bilateral upper and lower extremities. Musculoskeletal: No warm swelling or erythema around joints, no spinal tenderness noted. Psychiatric: Patient alert and oriented x3, good insight and cognition, good recent to remote recall. Lymph node survey: No cervical axillary or inguinal lymphadenopathy noted.  Lab Results:  Basic Metabolic Panel:    Component Value Date/Time   NA 138 08/28/2020 2338   NA 136 10/17/2018 1433   K 4.3 08/28/2020 2338   CL 106 08/28/2020 2338   CO2 27 08/28/2020 2338   BUN 8 08/28/2020 2338   BUN 6 10/17/2018 1433   CREATININE 0.74 08/28/2020 2338   GLUCOSE 88 08/28/2020 2338   CALCIUM 8.9 08/28/2020 2338   CBC:    Component Value Date/Time   WBC 10.7 (H) 08/30/2020 1134   HGB 8.8 (L) 08/30/2020 1134   HGB 9.6 (L) 10/17/2018 1433   HCT 24.2 (L) 08/30/2020 1134   HCT 26.2 (L) 10/17/2018 1433   PLT 323 08/30/2020 1134   PLT 297 10/17/2018 1433   MCV 95.7 08/30/2020 1134   MCV 108 (H) 10/17/2018 1433   NEUTROABS 2.9 08/30/2020 1134   NEUTROABS 7.0 10/17/2018 1433   LYMPHSABS 6.6 (H) 08/30/2020 1134   LYMPHSABS 4.6 (H) 10/17/2018 1433   MONOABS 1.0 08/30/2020 1134   EOSABS 0.2 08/30/2020 1134   EOSABS 0.2 10/17/2018 1433   BASOSABS 0.0 08/30/2020 1134   BASOSABS 0.1 10/17/2018 1433    Recent  Results (from the past 240 hour(s))  Culture, blood (routine x 2)     Status: Abnormal   Collection Time: 08/26/20  6:38 AM   Specimen: BLOOD  Result Value Ref Range Status   Specimen Description   Final    BLOOD PORTA CATH Performed at [facility], [address] [place], [place] [address]    Special Requests   Final    BOTTLES DRAWN AEROBIC AND ANAEROBIC Blood Culture adequate volume Performed at [facility], [address] [place], [place] [address]    Culture  Setup Time   Final    GRAM POSITIVE COCCI IN CHAINS IN BOTH AEROBIC AND ANAEROBIC  BOTTLES CRITICAL RESULT CALLED TO, READ BACK BY AND VERIFIED WITH: DR Hart RochesterHOLLIS 0126 [id number] FCP    Culture (A)  Final    ENTEROCOCCUS FAECALIS STAPHYLOCOCCUS AUREUS CRITICAL RESULT CALLED TO, READ BACK BY AND VERIFIED WITH: RN K.FREEMAN AT 1157 ON 08/27/2020 BY T.SAAD Performed at [facility], [address] [place], [place] [address]    Report Status 08/30/2020 FINAL  Final   Organism ID, Bacteria ENTEROCOCCUS FAECALIS  Final   Organism ID, Bacteria STAPHYLOCOCCUS AUREUS  Final      Susceptibility   Enterococcus faecalis - MIC*    AMPICILLIN <=2 SENSITIVE Sensitive     VANCOMYCIN 1 SENSITIVE Sensitive     GENTAMICIN SYNERGY SENSITIVE Sensitive     * ENTEROCOCCUS FAECALIS   Staphylococcus aureus - MIC*    CIPROFLOXACIN <=0.5 SENSITIVE Sensitive     ERYTHROMYCIN >=8 RESISTANT Resistant     GENTAMICIN <=0.5 SENSITIVE Sensitive     OXACILLIN <=0.25 SENSITIVE Sensitive     TETRACYCLINE <=1 SENSITIVE Sensitive     VANCOMYCIN 1 SENSITIVE Sensitive     TRIMETH/SULFA <=10 SENSITIVE Sensitive     CLINDAMYCIN <=0.25 SENSITIVE Sensitive     RIFAMPIN <=0.5 SENSITIVE Sensitive     Inducible Clindamycin NEGATIVE Sensitive     * STAPHYLOCOCCUS AUREUS  Blood Culture ID Panel (Reflexed)     Status: Abnormal   Collection Time: 08/26/20  6:38 AM  Result Value Ref Range Status   Enterococcus faecalis DETECTED (A) NOT DETECTED Final    Comment: CRITICAL RESULT CALLED TO, READ BACK BY AND VERIFIED WITH: DR HOLLIS 0126 [phone] FCP    Enterococcus Faecium NOT DETECTED NOT DETECTED Final   Listeria monocytogenes NOT DETECTED NOT DETECTED Final   Staphylococcus species NOT DETECTED NOT DETECTED Final   Staphylococcus aureus (BCID) NOT DETECTED NOT DETECTED Final   Staphylococcus epidermidis NOT DETECTED NOT DETECTED Final   Staphylococcus lugdunensis NOT DETECTED NOT DETECTED Final   Streptococcus species NOT DETECTED NOT DETECTED Final   Streptococcus agalactiae NOT DETECTED NOT DETECTED Final    Streptococcus pneumoniae NOT DETECTED NOT DETECTED Final   Streptococcus pyogenes NOT DETECTED NOT DETECTED Final   A.calcoaceticus-baumannii NOT DETECTED NOT DETECTED Final   Bacteroides fragilis NOT DETECTED NOT DETECTED Final   Enterobacterales NOT DETECTED NOT DETECTED Final   Enterobacter cloacae complex NOT DETECTED NOT DETECTED Final   Escherichia coli NOT DETECTED NOT DETECTED Final   Klebsiella aerogenes NOT DETECTED NOT DETECTED Final   Klebsiella oxytoca NOT DETECTED NOT DETECTED Final   Klebsiella pneumoniae NOT DETECTED NOT DETECTED Final   Proteus species NOT DETECTED NOT DETECTED Final   Salmonella species NOT DETECTED NOT DETECTED Final   Serratia marcescens NOT DETECTED NOT DETECTED Final   Haemophilus influenzae NOT DETECTED NOT DETECTED Final   Neisseria meningitidis NOT  DETECTED NOT DETECTED Final   Pseudomonas aeruginosa NOT DETECTED NOT DETECTED Final   Stenotrophomonas maltophilia NOT DETECTED NOT DETECTED Final   Candida albicans NOT DETECTED NOT DETECTED Final   Candida auris NOT DETECTED NOT DETECTED Final   Candida glabrata NOT DETECTED NOT DETECTED Final   Candida krusei NOT DETECTED NOT DETECTED Final   Candida parapsilosis NOT DETECTED NOT DETECTED Final   Candida tropicalis NOT DETECTED NOT DETECTED Final   Cryptococcus neoformans/gattii NOT DETECTED NOT DETECTED Final   Meth resistant mecA/C and MREJ NOT DETECTED NOT DETECTED Final   Vancomycin resistance NOT DETECTED NOT DETECTED Final    Comment: Performed at [facility], [address] [place], [place] [address]  Resp Panel by RT-PCR (Flu A&B, Covid) Nasopharyngeal Swab     Status: None   Collection Time: 08/26/20  6:39 AM   Specimen: Nasopharyngeal Swab; Nasopharyngeal(NP) swabs in vial transport medium  Result Value Ref Range Status   SARS Coronavirus 2 by RT PCR NEGATIVE NEGATIVE Final    Comment: (NOTE) SARS-CoV-2 target nucleic acids are NOT DETECTED.  The SARS-CoV-2 RNA is  generally detectable in upper respiratory specimens during the acute phase of infection. The lowest concentration of SARS-CoV-2 viral copies this assay can detect is 138 copies/mL. A negative result does not preclude SARS-Cov-2 infection and should not be used as the sole basis for treatment or other patient management decisions. A negative result may occur with  improper specimen collection/handling, submission of specimen other than nasopharyngeal swab, presence of viral mutation(s) within the areas targeted by this assay, and inadequate number of viral copies(<138 copies/mL). A negative result must be combined with clinical observations, patient history, and epidemiological information. The expected result is Negative.  Fact Sheet for Patients:  BloggerCourse.com  Fact Sheet for Healthcare Providers:  SeriousBroker.it  This test is no t yet approved or cleared by the [place] FDA and  has been authorized for detection and/or diagnosis of SARS-CoV-2 by FDA under an Emergency Use Authorization (EUA). This EUA will remain  in effect (meaning this test can be used) for the duration of the COVID-19 declaration under Section 564(b)(1) of the Act, 21 U.S.C.section 360bbb-3(b)(1), unless the authorization is terminated  or revoked sooner.       Influenza A by PCR NEGATIVE NEGATIVE Final   Influenza B by PCR NEGATIVE NEGATIVE Final    Comment: (NOTE) The Xpert Xpress SARS-CoV-2/FLU/RSV plus assay is intended as an aid in the diagnosis of influenza from Nasopharyngeal swab specimens and should not be used as a sole basis for treatment. Nasal washings and aspirates are unacceptable for Xpert Xpress SARS-CoV-2/FLU/RSV testing.  Fact Sheet for Patients: BloggerCourse.com  Fact Sheet for Healthcare Providers: SeriousBroker.it  This test is not yet approved or cleared by the [place] FDA and has been authorized for detection and/or diagnosis of SARS-CoV-2 by FDA under an Emergency Use Authorization (EUA). This EUA will remain in effect (meaning this test can be used) for the duration of the COVID-19 declaration under Section 564(b)(1) of the Act, 21 U.S.C. section 360bbb-3(b)(1), unless the authorization is terminated or revoked.  Performed at [facility], [address], [place], [place] [address]   Culture, blood (routine x 2)     Status: Abnormal   Collection Time: 08/26/20  6:43 AM   Specimen: BLOOD RIGHT HAND  Result Value Ref Range Status   Specimen Description   Final    BLOOD RIGHT HAND Performed at [facility], [address], [place], [place] [address]    Special Requests   Final    BOTTLES DRAWN AEROBIC AND ANAEROBIC Blood Culture results may not be optimal due to an inadequate volume of blood received in culture bottles Performed at [facility], [address] [place], [place] [address]    Culture  Setup Time   Final    GRAM POSITIVE COCCI IN CHAINS IN BOTH AEROBIC AND ANAEROBIC BOTTLES    Culture (A)  Final    ENTEROCOCCUS FAECALIS SUSCEPTIBILITIES PERFORMED ON PREVIOUS CULTURE WITHIN THE LAST 5 DAYS. Performed at [facility], [address], [place], [place] [address]    Report Status 08/29/2020 FINAL  Final  Blood culture (routine x 2)     Status: Abnormal (Preliminary result)   Collection Time: 08/28/20 10:14 PM   Specimen: BLOOD  Result Value Ref Range Status   Specimen Description   Final    BLOOD RIGHT HAND Performed at [facility], [address] [place], [place] [address]    Special Requests   Final    BOTTLES DRAWN AEROBIC AND ANAEROBIC Blood Culture adequate volume Performed at [facility], [address], [place], [place] [address]    Culture  Setup Time   Final    GRAM POSITIVE COCCI IN CHAINS AEROBIC BOTTLE  ONLY Organism ID to follow CRITICAL RESULT CALLED TO, READ BACK BY AND VERIFIED WITH: PHARMD Sheppard Penton POINDEXTER BY MESSAN H. AT 0348 ON 08/30/2020    Culture (A)  Final    ENTEROCOCCUS FAECALIS SUSCEPTIBILITIES TO FOLLOW Performed at [facility], [address] [place], [place] [address]    Report Status PENDING  Incomplete  Blood Culture ID Panel (Reflexed)     Status: Abnormal   Collection Time: 08/28/20 10:14 PM  Result Value Ref Range Status   Enterococcus faecalis DETECTED (A) NOT DETECTED Final    Comment: CRITICAL RESULT CALLED TO, READ BACK BY AND VERIFIED WITH: PHARMD Sheppard Penton [facility] BY [facility] AT 9794 ON 08/30/2020    Enterococcus Faecium NOT DETECTED NOT DETECTED Final   Listeria monocytogenes NOT DETECTED NOT DETECTED Final   Staphylococcus species NOT DETECTED NOT DETECTED Final   Staphylococcus aureus (BCID) NOT DETECTED NOT DETECTED Final   Staphylococcus epidermidis NOT DETECTED NOT DETECTED Final   Staphylococcus lugdunensis NOT DETECTED NOT DETECTED Final   Streptococcus species NOT DETECTED NOT DETECTED Final   Streptococcus agalactiae NOT DETECTED NOT DETECTED Final   Streptococcus pneumoniae NOT DETECTED NOT DETECTED Final   Streptococcus pyogenes NOT DETECTED NOT DETECTED Final   A.calcoaceticus-baumannii NOT DETECTED NOT DETECTED Final   Bacteroides fragilis NOT DETECTED NOT DETECTED Final   Enterobacterales NOT DETECTED NOT DETECTED Final   Enterobacter cloacae complex NOT DETECTED NOT DETECTED Final   Escherichia coli NOT DETECTED NOT DETECTED Final   Klebsiella aerogenes NOT DETECTED NOT DETECTED Final   Klebsiella oxytoca NOT DETECTED NOT DETECTED Final   Klebsiella pneumoniae NOT DETECTED NOT DETECTED Final   Proteus species NOT DETECTED NOT DETECTED Final   Salmonella species NOT DETECTED NOT DETECTED Final   Serratia marcescens NOT DETECTED NOT DETECTED Final   Haemophilus influenzae NOT DETECTED NOT DETECTED Final   Neisseria meningitidis  NOT DETECTED NOT DETECTED Final   Pseudomonas aeruginosa NOT DETECTED NOT DETECTED Final   Stenotrophomonas maltophilia NOT DETECTED NOT DETECTED Final   Candida albicans NOT DETECTED NOT DETECTED Final   Candida auris NOT DETECTED NOT DETECTED Final  Candida glabrata NOT DETECTED NOT DETECTED Final   Candida krusei NOT DETECTED NOT DETECTED Final   Candida parapsilosis NOT DETECTED NOT DETECTED Final   Candida tropicalis NOT DETECTED NOT DETECTED Final   Cryptococcus neoformans/gattii NOT DETECTED NOT DETECTED Final   Vancomycin resistance NOT DETECTED NOT DETECTED Final    Comment: Performed at [facility], [address] [place], [place] [address]  Blood culture (routine x 2)     Status: None (Preliminary result)   Collection Time: 08/28/20 10:19 PM   Specimen: BLOOD  Result Value Ref Range Status   Specimen Description   Final    BLOOD LEFT HAND Performed at [facility], [address], [place], [place] [address]    Special Requests   Final    BOTTLES DRAWN AEROBIC ONLY Blood Culture results may not be optimal due to an inadequate volume of blood received in culture bottles Performed at [facility], [address], [place], [place] [address]    Culture   Final    NO GROWTH 1 DAY Performed at [facility], [address], [place], [place] [address]    Report Status PENDING  Incomplete  Resp Panel by RT-PCR (Flu A&B, Covid) Nasopharyngeal Swab     Status: None   Collection Time: 08/28/20 11:38 PM   Specimen: Nasopharyngeal Swab; Nasopharyngeal(NP) swabs in vial transport medium  Result Value Ref Range Status   SARS Coronavirus 2 by RT PCR NEGATIVE NEGATIVE Final    Comment: (NOTE) SARS-CoV-2 target nucleic acids are NOT DETECTED.  The SARS-CoV-2 RNA is generally detectable in upper respiratory specimens during the acute phase of infection. The lowest concentration of SARS-CoV-2 viral copies this assay can detect is 138  copies/mL. A negative result does not preclude SARS-Cov-2 infection and should not be used as the sole basis for treatment or other patient management decisions. A negative result may occur with  improper specimen collection/handling, submission of specimen other than nasopharyngeal swab, presence of viral mutation(s) within the areas targeted by this assay, and inadequate number of viral copies(<138 copies/mL). A negative result must be combined with clinical observations, patient history, and epidemiological information. The expected result is Negative.  Fact Sheet for Patients:  BloggerCourse.com  Fact Sheet for Healthcare Providers:  SeriousBroker.it  This test is no t yet approved or cleared by the [place] FDA and  has been authorized for detection and/or diagnosis of SARS-CoV-2 by FDA under an Emergency Use Authorization (EUA). This EUA will remain  in effect (meaning this test can be used) for the duration of the COVID-19 declaration under Section 564(b)(1) of the Act, 21 U.S.C.section 360bbb-3(b)(1), unless the authorization is terminated  or revoked sooner.       Influenza A by PCR NEGATIVE NEGATIVE Final   Influenza B by PCR NEGATIVE NEGATIVE Final    Comment: (NOTE) The Xpert Xpress SARS-CoV-2/FLU/RSV plus assay is intended as an aid in the diagnosis of influenza from Nasopharyngeal swab specimens and should not be used as a sole basis for treatment. Nasal washings and aspirates are unacceptable for Xpert Xpress SARS-CoV-2/FLU/RSV testing.  Fact Sheet for Patients: BloggerCourse.com  Fact Sheet for Healthcare Providers: SeriousBroker.it  This test is not yet approved or cleared by the [place] FDA and has been authorized for detection and/or diagnosis of SARS-CoV-2 by FDA under an Emergency Use Authorization (EUA). This EUA will remain in effect (meaning  this test can be used) for the duration of the COVID-19  declaration under Section 564(b)(1) of the Act, 21 U.S.C. section 360bbb-3(b)(1), unless the authorization is terminated or revoked.  Performed at [name], [address], [place], [place] [address]   Urine culture     Status: Abnormal   Collection Time: 08/29/20  2:45 AM   Specimen: Urine, Clean Catch  Result Value Ref Range Status   Specimen Description   Final    URINE, CLEAN CATCH Performed at [facility], [address] [place], [place] [address]    Special Requests   Final    NONE Performed at [facility], [address] [place], [place] [address]    Culture (A)  Final    <10,000 COLONIES/mL INSIGNIFICANT GROWTH Performed at [facility], [address] [place], [place] [address]    Report Status 08/30/2020 FINAL  Final    Studies/Results: IR REMOVAL TUN ACCESS W/ PORT W/O FL MOD SED  Result Date: 08/30/2020 CLINICAL DATA:  Bacteremia and indwelling right chest port original placed on 08/29/2019. Request has been made to remove the port due to persistent bacteremia. EXAM: REMOVAL OF IMPLANTED TUNNELED PORT-A-CATH MEDICATIONS: Moderate (conscious) sedation was employed during this procedure. A total of Versed 4.0 mg and Fentanyl 200 mcg was administered intravenously. Moderate Sedation Time: 27 minutes. The patient's level of consciousness and vital signs were monitored continuously by radiology nursing throughout the procedure under my direct supervision. PROCEDURE: The right chest Port-A-Cath site was prepped with chlorhexidine. A sterile gown and gloves were worn during the procedure. Local anesthesia was provided with 1% lidocaine. An incision was made overlying the Port-A-Cath with a #15 scalpel. Utilizing sharp and blunt dissection, the Port-A-Cath was removed. Portable cautery was utilized. The pocket was irrigated with sterile saline. Wound  closure was performed with subcutaneous 3-0 Monocryl, subcuticular 4-0 Vicryl and Dermabond. The entire Port-A-Cath was removed successfully. IMPRESSION: Removal of implanted Port-A-Cath utilizing sharp and blunt dissection. The procedure was uncomplicated. Electronically Signed   By: Irish Lack M.D.   On: 08/30/2020 17:20   ECHO TEE  Result Date: 08/30/2020    TRANSESOPHOGEAL ECHO REPORT   Patient Name:   BURREL LEGRAND Date of Exam: 08/30/2020 Medical Rec #:  [id number]           Height:       75.0 in Accession #:    [id number]          Weight:       150.0 lb Date of Birth:  1991-02-25           BSA:          1.942 m Patient Age:    29 years            BP:           115/81 mmHg Patient Gender: M                   HR:           101 bpm. Exam Location:  Inpatient Procedure: Transesophageal Echo, Color Doppler and Cardiac Doppler Indications:     Bacteremia  History:         Patient has no prior history of Echocardiogram examinations.  Sonographer:     Eulah Pont RDCS Referring Phys:  [phone] JILL D MCDANIEL Diagnosing Phys: Zoila Shutter MD PROCEDURE: After discussion of the risks and benefits of a TEE, an informed consent was obtained from the patient. The transesophogeal probe was passed  without difficulty through the esophogus of the patient. Sedation performed by different physician. The patient was monitored while under deep sedation. Anesthestetic sedation was provided intravenously by Anesthesiology: 186mg  of Propofol. The patient's vital signs; including heart rate, blood pressure, and oxygen saturation; remained stable throughout the procedure. The patient developed no complications during the procedure. IMPRESSIONS  1. Left ventricular ejection fraction, by estimation, is 60 to 65%. The left ventricle has normal function.  2. Right ventricular systolic function is normal. The right ventricular size is normal.  3. No left atrial/left atrial appendage thrombus was detected.  4. Right atrial  size was mildly dilated. Catheter noted in the right atrium without vegetation.  5. The mitral valve is grossly normal. Trivial mitral valve regurgitation.  6. The aortic valve is tricuspid. Aortic valve regurgitation is not visualized. Conclusion(s)/Recommendation(s): No evidence of vegetation/infective endocarditis on this transesophageal echocardiogram. FINDINGS  Left Ventricle: Left ventricular ejection fraction, by estimation, is 60 to 65%. The left ventricle has normal function. The left ventricular internal cavity size was normal in size. There is no left ventricular hypertrophy. Right Ventricle: The right ventricular size is normal. No increase in right ventricular wall thickness. Right ventricular systolic function is normal. Left Atrium: Left atrial size was normal in size. No left atrial/left atrial appendage thrombus was detected. Right Atrium: Right atrial size was mildly dilated. Catheter noted in the right atrium without vegetation. Pericardium: There is no evidence of pericardial effusion. Mitral Valve: The mitral valve is grossly normal. Trivial mitral valve regurgitation. Tricuspid Valve: The tricuspid valve is grossly normal. Tricuspid valve regurgitation is trivial. Aortic Valve: The aortic valve is tricuspid. Aortic valve regurgitation is not visualized. Pulmonic Valve: The pulmonic valve was grossly normal. Pulmonic valve regurgitation is trivial. Aorta: The aortic root and ascending aorta are structurally normal, with no evidence of dilitation. IAS/Shunts: No atrial level shunt detected by color flow Doppler. MD Electronically signed by Zoila Shutter MD Signature Date/Time: 08/30/2020/3:01:04 PM    Final     Medications: Scheduled Meds: . abacavir-dolutegravir-lamiVUDine  1 tablet Oral QHS  . apixaban  5 mg Oral BID  . HYDROmorphone   Intravenous Q4H  .  HYDROmorphone (DILAUDID) injection  2 mg Intravenous Once  . hydroxyurea  2,000 mg Oral QHS  . multivitamin with  minerals  1 tablet Oral Q breakfast  . polyethylene glycol  17 g Oral Daily  . senna-docusate  2 tablet Oral BID   Continuous Infusions: . ampicillin-sulbactam (UNASYN) IV 3 g (08/31/20 1153)  . diphenhydrAMINE 25 mg (08/31/20 1026)   PRN Meds:.ALPRAZolam, diphenhydrAMINE **OR** diphenhydrAMINE, lip balm, naloxone **AND** sodium chloride flush, ondansetron  Consultants:  Infectious disease  Procedures:  TEE  Antibiotics:  IV Unasyn  Assessment/Plan: Principal Problem:   Bacteremia due to Enterococcus Active Problems:   HIV (human immunodeficiency virus infection) (HCC)   History of pulmonary embolus (PE)   Sickle cell anemia with pain (HCC)  1. Enterococci bacteremia: Patient is followed by infectious disease specialist. He is currently on IV Unasyn. Port-A-Cath removed. TEE negative for vegetation.  Patient now afebrile.  We will continue antibiotics and possibly change to oral and discharge home when cleared by infectious disease. Repeat blood culture has been negative so far. 2. Hb Sickle Cell Disease with crisis: Reduce IVF 0.45% Saline to 50 mls/hour, continue weight based Dilaudid PCA at current dose setting, IV Toradol is contraindicated due to allergy, Monitor vitals very closely, Re-evaluate pain scale regularly, 2 L of Oxygen by [place]. 3.  Leukocytosis: Mild Leukocytosis, afebrile. Patient on IV antibiotics. Continue to monitor closely 4. Sickle Cell Anemia: Hb is stable at baseline, no clinical indication for blood transfusion today. 5. Chronic pain Syndrome: Restart and continue home pain medications. 6. History of PE: Continue Eliquis.  7. Asymptomatic HIV: Continue HAART.   Code Status: Full Code Family Communication: N/A Disposition Plan: Not yet ready for discharge  Jessee Newnam  If 7PM-7AM, please contact night-coverage.  08/31/2020, 2:56 PM  LOS: 2 days

## 2020-08-31 NOTE — Progress Notes (Signed)
Patient is acting extremely out of character. When asked if he was okay he said "excuse me I'm high as hell." Patient's PCA was most recently clear at 1.5 mg. Patient spilled entire gingerale in bed and when NT was cleaning she found several items in his bed including an opened 21 gauge needle package, gauze with blood on it and prevantics wipes. Prevantics wipes have been unavailable for several weeks at this point. Also noted was a 10 mL syringe with approximately 4 mL clear liquid in it. I haven't given the patient anything via IV push today.

## 2020-09-01 ENCOUNTER — Encounter (HOSPITAL_COMMUNITY): Payer: Self-pay | Admitting: Internal Medicine

## 2020-09-01 DIAGNOSIS — B20 Human immunodeficiency virus [HIV] disease: Secondary | ICD-10-CM | POA: Diagnosis not present

## 2020-09-01 DIAGNOSIS — D5700 Hb-SS disease with crisis, unspecified: Secondary | ICD-10-CM | POA: Diagnosis not present

## 2020-09-01 DIAGNOSIS — Z86711 Personal history of pulmonary embolism: Secondary | ICD-10-CM | POA: Diagnosis not present

## 2020-09-01 DIAGNOSIS — R7881 Bacteremia: Secondary | ICD-10-CM | POA: Diagnosis not present

## 2020-09-01 LAB — CBC WITH DIFFERENTIAL/PLATELET
Abs Immature Granulocytes: 0.03 10*3/uL (ref 0.00–0.07)
Basophils Absolute: 0.1 10*3/uL (ref 0.0–0.1)
Basophils Relative: 0 %
Eosinophils Absolute: 0.3 10*3/uL (ref 0.0–0.5)
Eosinophils Relative: 2 %
HCT: 21.3 % — ABNORMAL LOW (ref 39.0–52.0)
Hemoglobin: 8.2 g/dL — ABNORMAL LOW (ref 13.0–17.0)
Immature Granulocytes: 0 %
Lymphocytes Relative: 65 %
Lymphs Abs: 9 10*3/uL — ABNORMAL HIGH (ref 0.7–4.0)
MCH: 35.2 pg — ABNORMAL HIGH (ref 26.0–34.0)
MCHC: 38.5 g/dL — ABNORMAL HIGH (ref 30.0–36.0)
MCV: 91.4 fL (ref 80.0–100.0)
Monocytes Absolute: 1.1 10*3/uL — ABNORMAL HIGH (ref 0.1–1.0)
Monocytes Relative: 8 %
Neutro Abs: 3.5 10*3/uL (ref 1.7–7.7)
Neutrophils Relative %: 25 %
Platelets: 327 10*3/uL (ref 150–400)
RBC: 2.33 MIL/uL — ABNORMAL LOW (ref 4.22–5.81)
RDW: 22.3 % — ABNORMAL HIGH (ref 11.5–15.5)
WBC: 14 10*3/uL — ABNORMAL HIGH (ref 4.0–10.5)
nRBC: 3 % — ABNORMAL HIGH (ref 0.0–0.2)

## 2020-09-01 LAB — CULTURE, BLOOD (ROUTINE X 2): Special Requests: ADEQUATE

## 2020-09-01 MED ORDER — HYDROMORPHONE 1 MG/ML IV SOLN
INTRAVENOUS | Status: DC
  Administered 2020-09-02: 4.8 mg via INTRAVENOUS
  Administered 2020-09-02: 1.2 mg via INTRAVENOUS

## 2020-09-01 MED ORDER — OXYCODONE-ACETAMINOPHEN 5-325 MG PO TABS
1.0000 | ORAL_TABLET | ORAL | Status: DC | PRN
  Administered 2020-09-02 – 2020-09-04 (×9): 1 via ORAL
  Filled 2020-09-01 (×9): qty 1

## 2020-09-01 MED ORDER — OXYCODONE HCL 5 MG PO TABS
5.0000 mg | ORAL_TABLET | ORAL | Status: DC | PRN
  Administered 2020-09-02 – 2020-09-04 (×9): 5 mg via ORAL
  Filled 2020-09-01 (×9): qty 1

## 2020-09-01 MED ORDER — OXYCODONE-ACETAMINOPHEN 10-325 MG PO TABS: 1.0000 | ORAL_TABLET | ORAL | Status: DC | PRN

## 2020-09-01 NOTE — Progress Notes (Signed)
Patient ID: Martin [name], male   DOB: 23-Mar-1991, 30 y.o.   MRN: [id number] Subjective: Martin [name] is a 30 year old male with a medical history significant for sickle cell disease, chronic pain syndrome, opiate dependence and tolerance, history of HIV disease, generalized anxiety, and anemia was admitted for bacteremia in the setting of sickle cell pain crisis.  Patient feels slightly better today, no more fever. He's tolerating oral intake with no vomiting. Patient still asking for enema even though he got one yesterday. He denies any fever, cough, chest pain, nausea, vomiting or diarrhea. No SOB.  Objective:  Vital signs in last 24 hours:  Vitals:   09/01/20 1023 09/01/20 1205 09/01/20 1511 09/01/20 1515  BP: 104/63     Pulse: 79     Resp: 13 13 13 13   Temp: 98.7 F (37.1 C)     TempSrc: Oral     SpO2: 93% 96% 97% 97%  Weight:      Height:       Intake/Output from previous day:  Intake/Output Summary (Last 24 hours) at 09/01/2020 1541 Last data filed at 09/01/2020 1228 Gross per 24 hour  Intake 240 ml  Output 2950 ml  Net -2710 ml   Physical Exam: General: Alert, awake, oriented x3, in no acute distress.  Anxious looking. HEENT: [place]/AT PEERL, EOMI Neck: Trachea midline,  no masses, no thyromegal,y no JVD, no carotid bruit OROPHARYNX:  Moist, No exudate/ erythema/lesions.  Heart: Regular rate and rhythm, without murmurs, rubs, gallops, PMI non-displaced, no heaves or thrills on palpation.  Lungs: Clear to auscultation, no wheezing or rhonchi noted. No increased vocal fremitus resonant to percussion  Abdomen: Soft, nontender, nondistended, positive bowel sounds, no masses no hepatosplenomegaly noted..  Neuro: No focal neurological deficits noted cranial nerves II through XII grossly intact. DTRs 2+ bilaterally upper and lower extremities. Strength 5 out of 5 in bilateral upper and lower extremities. Musculoskeletal: No warm swelling or erythema around joints, no  spinal tenderness noted. Psychiatric: Patient alert and oriented x3, good insight and cognition, good recent to remote recall. Lymph node survey: No cervical axillary or inguinal lymphadenopathy noted.  Lab Results:  Basic Metabolic Panel:    Component Value Date/Time   NA 138 08/28/2020 2338   NA 136 10/17/2018 1433   K 4.3 08/28/2020 2338   CL 106 08/28/2020 2338   CO2 27 08/28/2020 2338   BUN 8 08/28/2020 2338   BUN 6 10/17/2018 1433   CREATININE 0.74 08/28/2020 2338   GLUCOSE 88 08/28/2020 2338   CALCIUM 8.9 08/28/2020 2338   CBC:    Component Value Date/Time   WBC 14.0 (H) 09/01/2020 0519   HGB 8.2 (L) 09/01/2020 0519   HGB 9.6 (L) 10/17/2018 1433   HCT 21.3 (L) 09/01/2020 0519   HCT 26.2 (L) 10/17/2018 1433   PLT 327 09/01/2020 0519   PLT 297 10/17/2018 1433   MCV 91.4 09/01/2020 0519   MCV 108 (H) 10/17/2018 1433   NEUTROABS 3.5 09/01/2020 0519   NEUTROABS 7.0 10/17/2018 1433   LYMPHSABS 9.0 (H) 09/01/2020 0519   LYMPHSABS 4.6 (H) 10/17/2018 1433   MONOABS 1.1 (H) 09/01/2020 0519   EOSABS 0.3 09/01/2020 0519   EOSABS 0.2 10/17/2018 1433   BASOSABS 0.1 09/01/2020 0519   BASOSABS 0.1 10/17/2018 1433    Recent Results (from the past 240 hour(s))  Culture, blood (routine x 2)     Status: Abnormal   Collection Time: 08/26/20  6:38 AM   Specimen: BLOOD  Result Value Ref Range Status   Specimen Description   Final    BLOOD PORTA CATH Performed at [facility], [address] [place], [place] [id number]    Special Requests   Final    BOTTLES DRAWN AEROBIC AND ANAEROBIC Blood Culture adequate volume Performed at [facility], [address] [place], [place] [id number]    Culture  Setup Time   Final    GRAM POSITIVE COCCI IN CHAINS IN BOTH AEROBIC AND ANAEROBIC BOTTLES CRITICAL RESULT CALLED TO, READ BACK BY AND VERIFIED WITH: DR Hart RochesterHOLLIS 0126 [id number] FCP    Culture (A)  Final    ENTEROCOCCUS FAECALIS STAPHYLOCOCCUS  AUREUS CRITICAL RESULT CALLED TO, READ BACK BY AND VERIFIED WITH: RN K.FREEMAN AT 1157 ON 08/27/2020 BY T.SAAD Performed at [facility], [address] [place], [place] [address]    Report Status 08/30/2020 FINAL  Final   Organism ID, Bacteria ENTEROCOCCUS FAECALIS  Final   Organism ID, Bacteria STAPHYLOCOCCUS AUREUS  Final      Susceptibility   Enterococcus faecalis - MIC*    AMPICILLIN <=2 SENSITIVE Sensitive     VANCOMYCIN 1 SENSITIVE Sensitive     GENTAMICIN SYNERGY SENSITIVE Sensitive     * ENTEROCOCCUS FAECALIS   Staphylococcus aureus - MIC*    CIPROFLOXACIN <=0.5 SENSITIVE Sensitive     ERYTHROMYCIN >=8 RESISTANT Resistant     GENTAMICIN <=0.5 SENSITIVE Sensitive     OXACILLIN <=0.25 SENSITIVE Sensitive     TETRACYCLINE <=1 SENSITIVE Sensitive     VANCOMYCIN 1 SENSITIVE Sensitive     TRIMETH/SULFA <=10 SENSITIVE Sensitive     CLINDAMYCIN <=0.25 SENSITIVE Sensitive     RIFAMPIN <=0.5 SENSITIVE Sensitive     Inducible Clindamycin NEGATIVE Sensitive     * STAPHYLOCOCCUS AUREUS  Blood Culture ID Panel (Reflexed)     Status: Abnormal   Collection Time: 08/26/20  6:38 AM  Result Value Ref Range Status   Enterococcus faecalis DETECTED (A) NOT DETECTED Final    Comment: CRITICAL RESULT CALLED TO, READ BACK BY AND VERIFIED WITH: DR HOLLIS 0126 [phone] FCP    Enterococcus Faecium NOT DETECTED NOT DETECTED Final   Listeria monocytogenes NOT DETECTED NOT DETECTED Final   Staphylococcus species NOT DETECTED NOT DETECTED Final   Staphylococcus aureus (BCID) NOT DETECTED NOT DETECTED Final   Staphylococcus epidermidis NOT DETECTED NOT DETECTED Final   Staphylococcus lugdunensis NOT DETECTED NOT DETECTED Final   Streptococcus species NOT DETECTED NOT DETECTED Final   Streptococcus agalactiae NOT DETECTED NOT DETECTED Final   Streptococcus pneumoniae NOT DETECTED NOT DETECTED Final   Streptococcus pyogenes NOT DETECTED NOT DETECTED Final   A.calcoaceticus-baumannii NOT DETECTED  NOT DETECTED Final   Bacteroides fragilis NOT DETECTED NOT DETECTED Final   Enterobacterales NOT DETECTED NOT DETECTED Final   Enterobacter cloacae complex NOT DETECTED NOT DETECTED Final   Escherichia coli NOT DETECTED NOT DETECTED Final   Klebsiella aerogenes NOT DETECTED NOT DETECTED Final   Klebsiella oxytoca NOT DETECTED NOT DETECTED Final   Klebsiella pneumoniae NOT DETECTED NOT DETECTED Final   Proteus species NOT DETECTED NOT DETECTED Final   Salmonella species NOT DETECTED NOT DETECTED Final   Serratia marcescens NOT DETECTED NOT DETECTED Final   Haemophilus influenzae NOT DETECTED NOT DETECTED Final   Neisseria meningitidis NOT DETECTED NOT DETECTED Final   Pseudomonas aeruginosa NOT DETECTED NOT DETECTED Final   Stenotrophomonas maltophilia NOT DETECTED NOT DETECTED Final   Candida albicans NOT DETECTED NOT DETECTED Final  Candida auris NOT DETECTED NOT DETECTED Final   Candida glabrata NOT DETECTED NOT DETECTED Final   Candida krusei NOT DETECTED NOT DETECTED Final   Candida parapsilosis NOT DETECTED NOT DETECTED Final   Candida tropicalis NOT DETECTED NOT DETECTED Final   Cryptococcus neoformans/gattii NOT DETECTED NOT DETECTED Final   Meth resistant mecA/C and MREJ NOT DETECTED NOT DETECTED Final   Vancomycin resistance NOT DETECTED NOT DETECTED Final    Comment: Performed at [facility], [address], [place], [place] [address]  Resp Panel by RT-PCR (Flu A&B, Covid) Nasopharyngeal Swab     Status: None   Collection Time: 08/26/20  6:39 AM   Specimen: Nasopharyngeal Swab; Nasopharyngeal(NP) swabs in vial transport medium  Result Value Ref Range Status   SARS Coronavirus 2 by RT PCR NEGATIVE NEGATIVE Final    Comment: (NOTE) SARS-CoV-2 target nucleic acids are NOT DETECTED.  The SARS-CoV-2 RNA is generally detectable in upper respiratory specimens during the acute phase of infection. The lowest concentration of SARS-CoV-2 viral copies this assay can detect  is 138 copies/mL. A negative result does not preclude SARS-Cov-2 infection and should not be used as the sole basis for treatment or other patient management decisions. A negative result may occur with  improper specimen collection/handling, submission of specimen other than nasopharyngeal swab, presence of viral mutation(s) within the areas targeted by this assay, and inadequate number of viral copies(<138 copies/mL). A negative result must be combined with clinical observations, patient history, and epidemiological information. The expected result is Negative.  Fact Sheet for Patients:  BloggerCourse.com  Fact Sheet for Healthcare Providers:  SeriousBroker.it  This test is no t yet approved or cleared by the [place] FDA and  has been authorized for detection and/or diagnosis of SARS-CoV-2 by FDA under an Emergency Use Authorization (EUA). This EUA will remain  in effect (meaning this test can be used) for the duration of the COVID-19 declaration under Section 564(b)(1) of the Act, 21 U.S.C.section 360bbb-3(b)(1), unless the authorization is terminated  or revoked sooner.       Influenza A by PCR NEGATIVE NEGATIVE Final   Influenza B by PCR NEGATIVE NEGATIVE Final    Comment: (NOTE) The Xpert Xpress SARS-CoV-2/FLU/RSV plus assay is intended as an aid in the diagnosis of influenza from Nasopharyngeal swab specimens and should not be used as a sole basis for treatment. Nasal washings and aspirates are unacceptable for Xpert Xpress SARS-CoV-2/FLU/RSV testing.  Fact Sheet for Patients: BloggerCourse.com  Fact Sheet for Healthcare Providers: SeriousBroker.it  This test is not yet approved or cleared by the [place] FDA and has been authorized for detection and/or diagnosis of SARS-CoV-2 by FDA under an Emergency Use Authorization (EUA). This EUA will remain in effect  (meaning this test can be used) for the duration of the COVID-19 declaration under Section 564(b)(1) of the Act, 21 U.S.C. section 360bbb-3(b)(1), unless the authorization is terminated or revoked.  Performed at [facility], [address], [place], [place] [address]   Culture, blood (routine x 2)     Status: Abnormal   Collection Time: 08/26/20  6:43 AM   Specimen: BLOOD RIGHT HAND  Result Value Ref Range Status   Specimen Description   Final    BLOOD RIGHT HAND Performed at [facility], [address], [place], [place] [address]    Special Requests   Final    BOTTLES DRAWN AEROBIC AND ANAEROBIC Blood Culture results may not be optimal due to  an inadequate volume of blood received in culture bottles Performed at [facility], [address] [place], [place] [address]    Culture  Setup Time   Final    GRAM POSITIVE COCCI IN CHAINS IN BOTH AEROBIC AND ANAEROBIC BOTTLES    Culture (A)  Final    ENTEROCOCCUS FAECALIS SUSCEPTIBILITIES PERFORMED ON PREVIOUS CULTURE WITHIN THE LAST 5 DAYS. Performed at [facility], [address] [place], [place] [address]    Report Status 08/29/2020 FINAL  Final  Blood culture (routine x 2)     Status: Abnormal   Collection Time: 08/28/20 10:14 PM   Specimen: BLOOD  Result Value Ref Range Status   Specimen Description   Final    BLOOD RIGHT HAND Performed at [facility], [address], [place], [place] [address]    Special Requests   Final    BOTTLES DRAWN AEROBIC AND ANAEROBIC Blood Culture adequate volume Performed at [facility], [address], [place], [place] [address]    Culture  Setup Time   Final    GRAM POSITIVE COCCI IN CHAINS AEROBIC BOTTLE ONLY Organism ID to follow CRITICAL RESULT CALLED TO, READ BACK BY AND VERIFIED WITH: PHARMD Sheppard Penton POINDEXTER BY MESSAN H. AT 0348 ON 08/30/2020 Performed at [facility],  [address] [place], [place] [address]    Culture ENTEROCOCCUS FAECALIS (A)  Final   Report Status 09/01/2020 FINAL  Final   Organism ID, Bacteria ENTEROCOCCUS FAECALIS  Final      Susceptibility   Enterococcus faecalis - MIC*    AMPICILLIN <=2 SENSITIVE Sensitive     VANCOMYCIN 1 SENSITIVE Sensitive     GENTAMICIN SYNERGY SENSITIVE Sensitive     * ENTEROCOCCUS FAECALIS  Blood Culture ID Panel (Reflexed)     Status: Abnormal   Collection Time: 08/28/20 10:14 PM  Result Value Ref Range Status   Enterococcus faecalis DETECTED (A) NOT DETECTED Final    Comment: CRITICAL RESULT CALLED TO, READ BACK BY AND VERIFIED WITH: PHARMD Sheppard Penton [facility] AT 6962 ON 08/30/2020    Enterococcus Faecium NOT DETECTED NOT DETECTED Final   Listeria monocytogenes NOT DETECTED NOT DETECTED Final   Staphylococcus species NOT DETECTED NOT DETECTED Final   Staphylococcus aureus (BCID) NOT DETECTED NOT DETECTED Final   Staphylococcus epidermidis NOT DETECTED NOT DETECTED Final   Staphylococcus lugdunensis NOT DETECTED NOT DETECTED Final   Streptococcus species NOT DETECTED NOT DETECTED Final   Streptococcus agalactiae NOT DETECTED NOT DETECTED Final   Streptococcus pneumoniae NOT DETECTED NOT DETECTED Final   Streptococcus pyogenes NOT DETECTED NOT DETECTED Final   A.calcoaceticus-baumannii NOT DETECTED NOT DETECTED Final   Bacteroides fragilis NOT DETECTED NOT DETECTED Final   Enterobacterales NOT DETECTED NOT DETECTED Final   Enterobacter cloacae complex NOT DETECTED NOT DETECTED Final   Escherichia coli NOT DETECTED NOT DETECTED Final   Klebsiella aerogenes NOT DETECTED NOT DETECTED Final   Klebsiella oxytoca NOT DETECTED NOT DETECTED Final   Klebsiella pneumoniae NOT DETECTED NOT DETECTED Final   Proteus species NOT DETECTED NOT DETECTED Final   Salmonella species NOT DETECTED NOT DETECTED Final   Serratia marcescens NOT DETECTED NOT DETECTED Final   Haemophilus influenzae NOT DETECTED NOT  DETECTED Final   Neisseria meningitidis NOT DETECTED NOT DETECTED Final   Pseudomonas aeruginosa NOT DETECTED NOT DETECTED Final   Stenotrophomonas maltophilia NOT DETECTED NOT DETECTED Final   Candida albicans NOT DETECTED NOT DETECTED Final  Candida auris NOT DETECTED NOT DETECTED Final   Candida glabrata NOT DETECTED NOT DETECTED Final   Candida krusei NOT DETECTED NOT DETECTED Final   Candida parapsilosis NOT DETECTED NOT DETECTED Final   Candida tropicalis NOT DETECTED NOT DETECTED Final   Cryptococcus neoformans/gattii NOT DETECTED NOT DETECTED Final   Vancomycin resistance NOT DETECTED NOT DETECTED Final    Comment: Performed at [facility], [address], [place], [place] [address]  Blood culture (routine x 2)     Status: None (Preliminary result)   Collection Time: 08/28/20 10:19 PM   Specimen: BLOOD  Result Value Ref Range Status   Specimen Description   Final    BLOOD LEFT HAND Performed at [facility], [address], [place], [place] [address]    Special Requests   Final    BOTTLES DRAWN AEROBIC ONLY Blood Culture results may not be optimal due to an inadequate volume of blood received in culture bottles Performed at [facility], [address] [place], [place] [address]    Culture   Final    NO GROWTH 3 DAYS Performed at [facility], [address] [place], [place] [address]    Report Status PENDING  Incomplete  Resp Panel by RT-PCR (Flu A&B, Covid) Nasopharyngeal Swab     Status: None   Collection Time: 08/28/20 11:38 PM   Specimen: Nasopharyngeal Swab; Nasopharyngeal(NP) swabs in vial transport medium  Result Value Ref Range Status   SARS Coronavirus 2 by RT PCR NEGATIVE NEGATIVE Final    Comment: (NOTE) SARS-CoV-2 target nucleic acids are NOT DETECTED.  The SARS-CoV-2 RNA is generally detectable in upper respiratory specimens during the acute phase of infection. The lowest concentration of SARS-CoV-2  viral copies this assay can detect is 138 copies/mL. A negative result does not preclude SARS-Cov-2 infection and should not be used as the sole basis for treatment or other patient management decisions. A negative result may occur with  improper specimen collection/handling, submission of specimen other than nasopharyngeal swab, presence of viral mutation(s) within the areas targeted by this assay, and inadequate number of viral copies(<138 copies/mL). A negative result must be combined with clinical observations, patient history, and epidemiological information. The expected result is Negative.  Fact Sheet for Patients:  BloggerCourse.com  Fact Sheet for Healthcare Providers:  SeriousBroker.it  This test is no t yet approved or cleared by the [place] FDA and  has been authorized for detection and/or diagnosis of SARS-CoV-2 by FDA under an Emergency Use Authorization (EUA). This EUA will remain  in effect (meaning this test can be used) for the duration of the COVID-19 declaration under Section 564(b)(1) of the Act, 21 U.S.C.section 360bbb-3(b)(1), unless the authorization is terminated  or revoked sooner.       Influenza A by PCR NEGATIVE NEGATIVE Final   Influenza B by PCR NEGATIVE NEGATIVE Final    Comment: (NOTE) The Xpert Xpress SARS-CoV-2/FLU/RSV plus assay is intended as an aid in the diagnosis of influenza from Nasopharyngeal swab specimens and should not be used as a sole basis for treatment. Nasal washings and aspirates are unacceptable for Xpert Xpress SARS-CoV-2/FLU/RSV testing.  Fact Sheet for Patients: BloggerCourse.com  Fact Sheet for Healthcare Providers: SeriousBroker.it  This test is not yet approved or cleared by the [place] FDA and has been authorized for detection and/or diagnosis of SARS-CoV-2 by FDA under an Emergency Use Authorization  (EUA). This EUA will remain in effect (meaning this test  can be used) for the duration of the COVID-19 declaration under Section 564(b)(1) of the Act, 21 U.S.C. section 360bbb-3(b)(1), unless the authorization is terminated or revoked.  Performed at [facility], [address], [place], [place] [address]   Urine culture     Status: Abnormal   Collection Time: 08/29/20  2:45 AM   Specimen: Urine, Clean Catch  Result Value Ref Range Status   Specimen Description   Final    URINE, CLEAN CATCH Performed at [facility], [address], [place], [place] [address]    Special Requests   Final    NONE Performed at [facility], [address], [place], [place] [address]    Culture (A)  Final    <10,000 COLONIES/mL INSIGNIFICANT GROWTH Performed at [facility], [address] [place], [place] [address]    Report Status 08/30/2020 FINAL  Final  Culture, blood (routine x 2)     Status: None (Preliminary result)   Collection Time: 08/30/20 11:38 PM   Specimen: BLOOD  Result Value Ref Range Status   Specimen Description   Final    BLOOD BLOOD LEFT FOREARM Performed at [facility], [address] [place], [place] [address]    Special Requests   Final    BOTTLES DRAWN AEROBIC ONLY Blood Culture adequate volume Performed at [facility], [address] [place], [place] [address]    Culture   Final    NO GROWTH 1 DAY Performed at [facility], [address] [place], [place] [address]    Report Status PENDING  Incomplete  Culture, blood (routine x 2)     Status: None (Preliminary result)   Collection Time: 08/30/20 11:38 PM   Specimen: BLOOD  Result Value Ref Range Status   Specimen Description   Final    BLOOD LEFT FINGER Performed at [facility], [address], [place], [place] [address]    Special Requests   Final    BOTTLES DRAWN AEROBIC ONLY  Blood Culture adequate volume Performed at [facility], [address] [place], [place] [address]    Culture   Final    NO GROWTH 1 DAY Performed at [facility], [address] [place], [place] [address]    Report Status PENDING  Incomplete    Studies/Results: IR REMOVAL TUN ACCESS W/ PORT W/O FL MOD SED  Result Date: 08/30/2020 CLINICAL DATA:  Bacteremia and indwelling right chest port original placed on 08/29/2019. Request has been made to remove the port due to persistent bacteremia. EXAM: REMOVAL OF IMPLANTED TUNNELED PORT-A-CATH MEDICATIONS: Moderate (conscious) sedation was employed during this procedure. A total of Versed 4.0 mg and Fentanyl 200 mcg was administered intravenously. Moderate Sedation Time: 27 minutes. The patient's level of consciousness and vital signs were monitored continuously by radiology nursing throughout the procedure under my direct supervision. PROCEDURE: The right chest Port-A-Cath site was prepped with chlorhexidine. A sterile gown and gloves were worn during the procedure. Local anesthesia was provided with 1% lidocaine. An incision was made overlying the Port-A-Cath with a #15 scalpel. Utilizing sharp and blunt dissection, the Port-A-Cath was removed. Portable cautery was utilized. The pocket was irrigated with sterile saline. Wound closure was performed with subcutaneous 3-0 Monocryl, subcuticular 4-0 Vicryl and Dermabond. The entire Port-A-Cath was removed successfully. IMPRESSION: Removal of implanted Port-A-Cath utilizing sharp and blunt dissection. The procedure was uncomplicated. Electronically Signed  By: Irish Lack M.D.   On: 08/30/2020 17:20    Medications: Scheduled Meds: . abacavir-dolutegravir-lamiVUDine  1 tablet Oral QHS  . apixaban  5 mg Oral BID  . HYDROmorphone   Intravenous Q4H  . hydroxyurea  2,000 mg Oral QHS  . multivitamin with minerals  1 tablet Oral Q breakfast  . polyethylene glycol  17 g Oral Daily   . senna-docusate  2 tablet Oral BID   Continuous Infusions: . ampicillin-sulbactam (UNASYN) IV 3 g (09/01/20 1205)  . diphenhydrAMINE 25 mg (09/01/20 1341)   PRN Meds:.ALPRAZolam, diphenhydrAMINE **OR** diphenhydrAMINE, lip balm, naloxone **AND** sodium chloride flush, ondansetron, oxyCODONE-acetaminophen **AND** oxyCODONE  Consultants:  Infectious disease  Procedures:  TEE  Antibiotics:  IV Unasyn  Assessment/Plan: Principal Problem:   Bacteremia due to Enterococcus Active Problems:   HIV (human immunodeficiency virus infection) (HCC)   History of pulmonary embolus (PE)   Sickle cell anemia with pain (HCC)  1. Enterococci bacteremia: Continue Unasyn. Patient is followed by infectious disease specialist. Port-A-Cath removed. TEE negative for vegetation. Patient now afebrile. We will continue antibiotics and possibly change to oral and discharge home when cleared by infectious disease. Repeat blood culture has been negative so far. 2. Hb Sickle Cell Disease with crisis: Reduce IVF to [facility], continue weight based Dilaudid PCA at current dose setting, IV Toradol is contraindicated due to allergy, Monitor vitals very closely, Re-evaluate pain scale regularly, 2 L of Oxygen by . 3. Leukocytosis: Mild Leukocytosis, afebrile. Patient on IV antibiotics. Continue to monitor closely. 4. Sickle Cell Anemia: Hb is stable at baseline, no clinical indication for blood transfusion today. 5. Chronic pain Syndrome: Restart and continue home pain medications. 6. History of PE: Continue Eliquis.  7. Asymptomatic HIV: Continue HAART.   Code Status: Full Code Family Communication: N/A Disposition Plan: Not yet ready for discharge  Ellorie Kindall  If 7PM-7AM, please contact night-coverage.  09/01/2020, 3:41 PM  LOS: 3 days

## 2020-09-02 ENCOUNTER — Other Ambulatory Visit (HOSPITAL_COMMUNITY): Payer: Self-pay

## 2020-09-02 ENCOUNTER — Telehealth: Payer: Self-pay

## 2020-09-02 DIAGNOSIS — B952 Enterococcus as the cause of diseases classified elsewhere: Secondary | ICD-10-CM | POA: Diagnosis not present

## 2020-09-02 DIAGNOSIS — R7881 Bacteremia: Secondary | ICD-10-CM | POA: Diagnosis not present

## 2020-09-02 LAB — CBC WITH DIFFERENTIAL/PLATELET
Abs Immature Granulocytes: 0.03 10*3/uL (ref 0.00–0.07)
Basophils Absolute: 0 10*3/uL (ref 0.0–0.1)
Basophils Relative: 0 %
Eosinophils Absolute: 0.1 10*3/uL (ref 0.0–0.5)
Eosinophils Relative: 1 %
HCT: 4.7 % — ABNORMAL LOW (ref 39.0–52.0)
Hemoglobin: 7.6 g/dL — ABNORMAL LOW (ref 13.0–17.0)
Immature Granulocytes: 0 %
Lymphocytes Relative: 59 %
Lymphs Abs: 7.2 10*3/uL — ABNORMAL HIGH (ref 0.7–4.0)
MCH: 146.2 pg — ABNORMAL HIGH (ref 26.0–34.0)
MCHC: 161.7 g/dL — ABNORMAL HIGH (ref 30.0–36.0)
MCV: 90.4 fL (ref 80.0–100.0)
Monocytes Absolute: 0.9 10*3/uL (ref 0.1–1.0)
Monocytes Relative: 7 %
Neutro Abs: 4 10*3/uL (ref 1.7–7.7)
Neutrophils Relative %: 33 %
Platelets: 223 10*3/uL (ref 150–400)
RBC: 0.52 MIL/uL — ABNORMAL LOW (ref 4.22–5.81)
RDW: 24.6 % — ABNORMAL HIGH (ref 11.5–15.5)
WBC: 12.3 10*3/uL — ABNORMAL HIGH (ref 4.0–10.5)
nRBC: 3.8 % — ABNORMAL HIGH (ref 0.0–0.2)

## 2020-09-02 LAB — BASIC METABOLIC PANEL
Anion gap: 6 (ref 5–15)
BUN: 15 mg/dL (ref 6–20)
CO2: 27 mmol/L (ref 22–32)
Calcium: 8.9 mg/dL (ref 8.9–10.3)
Chloride: 101 mmol/L (ref 98–111)
Creatinine, Ser: 1.09 mg/dL (ref 0.61–1.24)
GFR, Estimated: >60 mL/min (ref >60)
Glucose, Bld: 112 mg/dL — ABNORMAL HIGH (ref 70–99)
Potassium: 4.7 mmol/L (ref 3.5–5.1)
Sodium: 134 mmol/L — ABNORMAL LOW (ref 135–145)

## 2020-09-02 MED ORDER — HYDROMORPHONE 1 MG/ML IV SOLN
INTRAVENOUS | Status: DC
  Administered 2020-09-02: 1 mg via INTRAVENOUS
  Administered 2020-09-02: 30 mg via INTRAVENOUS
  Administered 2020-09-03: 6.4 mg via INTRAVENOUS
  Administered 2020-09-03: 3.6 mg via INTRAVENOUS
  Administered 2020-09-03: 30 mg via INTRAVENOUS
  Administered 2020-09-03: 7.6 mg via INTRAVENOUS
  Administered 2020-09-03: 5.2 mg via INTRAVENOUS
  Administered 2020-09-03: 7.2 mg via INTRAVENOUS
  Administered 2020-09-03: 4 mg via INTRAVENOUS
  Administered 2020-09-04: 4.8 mg via INTRAVENOUS
  Administered 2020-09-04: 1.6 mg via INTRAVENOUS
  Administered 2020-09-04: 0.4 mg via INTRAVENOUS
  Administered 2020-09-04: 6.4 mg via INTRAVENOUS
  Administered 2020-09-04: 1.6 mg via INTRAVENOUS
  Filled 2020-09-02 (×2): qty 30

## 2020-09-02 NOTE — Progress Notes (Signed)
Subjective: Martin [name] is a 30 year old male with a medical history significant for sickle cell disease, chronic pain syndrome, opiate dependence and tolerance, history of HIV disease, generalized anxiety, and history of anemia that was admitted for bacteremia in the setting of sickle cell pain crisis.  Patient states that pain intensity increased overnight.  He rates his pain a 6/10.  He has attempted to ambulate.  Patient denies fever, chills, chest pain, shortness of breath, nausea, vomiting, or diarrhea.  Objective:  Vital signs in last 24 hours:  Vitals:   09/02/20 0952 09/02/20 1229 09/02/20 1436 09/02/20 1448  BP: (!) 104/58  112/75   Pulse: 87  85   Resp: 14 15 16 16   Temp: 97.8 F (36.6 C)  98.6 F (37 C)   TempSrc: Oral  Oral   SpO2: 92% 94% 93% 96%  Weight:      Height:        Intake/Output from previous day:   Intake/Output Summary (Last 24 hours) at 09/02/2020 1714 Last data filed at 09/02/2020 1300 Gross per 24 hour  Intake 690 ml  Output 3750 ml  Net -3060 ml    Physical Exam: General: Alert, awake, oriented x3, in no acute distress.  HEENT: [place]/AT PEERL, EOMI Neck: Trachea midline,  no masses, no thyromegal,y no JVD, no carotid bruit OROPHARYNX:  Moist, No exudate/ erythema/lesions.  Heart: Regular rate and rhythm, without murmurs, rubs, gallops, PMI non-displaced, no heaves or thrills on palpation.  Lungs: Clear to auscultation, no wheezing or rhonchi noted. No increased vocal fremitus resonant to percussion  Abdomen: Soft, nontender, nondistended, positive bowel sounds, no masses no hepatosplenomegaly noted..  Neuro: No focal neurological deficits noted cranial nerves II through XII grossly intact. DTRs 2+ bilaterally upper and lower extremities. Strength 5 out of 5 in bilateral upper and lower extremities. Musculoskeletal: No warm swelling or erythema around joints, no spinal tenderness noted. Psychiatric: Patient alert and oriented x3, good insight  and cognition, good recent to remote recall. Lymph node survey: No cervical axillary or inguinal lymphadenopathy noted.  Lab Results:  Basic Metabolic Panel:    Component Value Date/Time   NA 134 (L) 09/02/2020 0500   NA 136 10/17/2018 1433   K 4.7 09/02/2020 0500   CL 101 09/02/2020 0500   CO2 27 09/02/2020 0500   BUN 15 09/02/2020 0500   BUN 6 10/17/2018 1433   CREATININE 1.09 09/02/2020 0500   GLUCOSE 112 (H) 09/02/2020 0500   CALCIUM 8.9 09/02/2020 0500   CBC:    Component Value Date/Time   WBC 12.3 (H) 09/02/2020 0500   HGB 7.6 (L) 09/02/2020 0500   HGB 9.6 (L) 10/17/2018 1433   HCT 4.7 (L) 09/02/2020 0500   HCT 26.2 (L) 10/17/2018 1433   PLT 223 09/02/2020 0500   PLT 297 10/17/2018 1433   MCV 90.4 09/02/2020 0500   MCV 108 (H) 10/17/2018 1433   NEUTROABS 4.0 09/02/2020 0500   NEUTROABS 7.0 10/17/2018 1433   LYMPHSABS 7.2 (H) 09/02/2020 0500   LYMPHSABS 4.6 (H) 10/17/2018 1433   MONOABS 0.9 09/02/2020 0500   EOSABS 0.1 09/02/2020 0500   EOSABS 0.2 10/17/2018 1433   BASOSABS 0.0 09/02/2020 0500   BASOSABS 0.1 10/17/2018 1433    Recent Results (from the past 240 hour(s))  Culture, blood (routine x 2)     Status: Abnormal   Collection Time: 08/26/20  6:38 AM   Specimen: BLOOD  Result Value Ref Range Status   Specimen Description  Final    BLOOD PORTA CATH Performed at [facility], [address] [place], [place] [address]    Special Requests   Final    BOTTLES DRAWN AEROBIC AND ANAEROBIC Blood Culture adequate volume Performed at [facility], [address] [place], [place] [address]    Culture  Setup Time   Final    GRAM POSITIVE COCCI IN CHAINS IN BOTH AEROBIC AND ANAEROBIC BOTTLES CRITICAL RESULT CALLED TO, READ BACK BY AND VERIFIED WITH: DR Hart RochesterHOLLIS 0126 [phone] FCP    Culture (A)  Final    ENTEROCOCCUS FAECALIS STAPHYLOCOCCUS AUREUS CRITICAL RESULT CALLED TO, READ BACK BY AND VERIFIED WITH: RN K.FREEMAN AT  1157 ON 08/27/2020 BY T.SAAD Performed at [facility], [address] [place], [place] [address]    Report Status 08/30/2020 FINAL  Final   Organism ID, Bacteria ENTEROCOCCUS FAECALIS  Final   Organism ID, Bacteria STAPHYLOCOCCUS AUREUS  Final      Susceptibility   Enterococcus faecalis - MIC*    AMPICILLIN <=2 SENSITIVE Sensitive     VANCOMYCIN 1 SENSITIVE Sensitive     GENTAMICIN SYNERGY SENSITIVE Sensitive     * ENTEROCOCCUS FAECALIS   Staphylococcus aureus - MIC*    CIPROFLOXACIN <=0.5 SENSITIVE Sensitive     ERYTHROMYCIN >=8 RESISTANT Resistant     GENTAMICIN <=0.5 SENSITIVE Sensitive     OXACILLIN <=0.25 SENSITIVE Sensitive     TETRACYCLINE <=1 SENSITIVE Sensitive     VANCOMYCIN 1 SENSITIVE Sensitive     TRIMETH/SULFA <=10 SENSITIVE Sensitive     CLINDAMYCIN <=0.25 SENSITIVE Sensitive     RIFAMPIN <=0.5 SENSITIVE Sensitive     Inducible Clindamycin NEGATIVE Sensitive     * STAPHYLOCOCCUS AUREUS  Blood Culture ID Panel (Reflexed)     Status: Abnormal   Collection Time: 08/26/20  6:38 AM  Result Value Ref Range Status   Enterococcus faecalis DETECTED (A) NOT DETECTED Final    Comment: CRITICAL RESULT CALLED TO, READ BACK BY AND VERIFIED WITH: DR Fatuma Dowers [phone] FCP    Enterococcus Faecium NOT DETECTED NOT DETECTED Final   Listeria monocytogenes NOT DETECTED NOT DETECTED Final   Staphylococcus species NOT DETECTED NOT DETECTED Final   Staphylococcus aureus (BCID) NOT DETECTED NOT DETECTED Final   Staphylococcus epidermidis NOT DETECTED NOT DETECTED Final   Staphylococcus lugdunensis NOT DETECTED NOT DETECTED Final   Streptococcus species NOT DETECTED NOT DETECTED Final   Streptococcus agalactiae NOT DETECTED NOT DETECTED Final   Streptococcus pneumoniae NOT DETECTED NOT DETECTED Final   Streptococcus pyogenes NOT DETECTED NOT DETECTED Final   A.calcoaceticus-baumannii NOT DETECTED NOT DETECTED Final   Bacteroides fragilis NOT DETECTED NOT DETECTED Final    Enterobacterales NOT DETECTED NOT DETECTED Final   Enterobacter cloacae complex NOT DETECTED NOT DETECTED Final   Escherichia coli NOT DETECTED NOT DETECTED Final   Klebsiella aerogenes NOT DETECTED NOT DETECTED Final   Klebsiella oxytoca NOT DETECTED NOT DETECTED Final   Klebsiella pneumoniae NOT DETECTED NOT DETECTED Final   Proteus species NOT DETECTED NOT DETECTED Final   Salmonella species NOT DETECTED NOT DETECTED Final   Serratia marcescens NOT DETECTED NOT DETECTED Final   Haemophilus influenzae NOT DETECTED NOT DETECTED Final   Neisseria meningitidis NOT DETECTED NOT DETECTED Final   Pseudomonas aeruginosa NOT DETECTED NOT DETECTED Final   Stenotrophomonas maltophilia NOT DETECTED NOT DETECTED Final   Candida albicans NOT DETECTED NOT DETECTED Final   Candida auris NOT DETECTED NOT DETECTED Final  Candida glabrata NOT DETECTED NOT DETECTED Final   Candida krusei NOT DETECTED NOT DETECTED Final   Candida parapsilosis NOT DETECTED NOT DETECTED Final   Candida tropicalis NOT DETECTED NOT DETECTED Final   Cryptococcus neoformans/gattii NOT DETECTED NOT DETECTED Final   Meth resistant mecA/C and MREJ NOT DETECTED NOT DETECTED Final   Vancomycin resistance NOT DETECTED NOT DETECTED Final    Comment: Performed at [facility], [address], [place], [place] [address]  Resp Panel by RT-PCR (Flu A&B, Covid) Nasopharyngeal Swab     Status: None   Collection Time: 08/26/20  6:39 AM   Specimen: Nasopharyngeal Swab; Nasopharyngeal(NP) swabs in vial transport medium  Result Value Ref Range Status   SARS Coronavirus 2 by RT PCR NEGATIVE NEGATIVE Final    Comment: (NOTE) SARS-CoV-2 target nucleic acids are NOT DETECTED.  The SARS-CoV-2 RNA is generally detectable in upper respiratory specimens during the acute phase of infection. The lowest concentration of SARS-CoV-2 viral copies this assay can detect is 138 copies/mL. A negative result does not preclude SARS-Cov-2 infection and  should not be used as the sole basis for treatment or other patient management decisions. A negative result may occur with  improper specimen collection/handling, submission of specimen other than nasopharyngeal swab, presence of viral mutation(s) within the areas targeted by this assay, and inadequate number of viral copies(<138 copies/mL). A negative result must be combined with clinical observations, patient history, and epidemiological information. The expected result is Negative.  Fact Sheet for Patients:  BloggerCourse.com  Fact Sheet for Healthcare Providers:  SeriousBroker.it  This test is no t yet approved or cleared by the [place] FDA and  has been authorized for detection and/or diagnosis of SARS-CoV-2 by FDA under an Emergency Use Authorization (EUA). This EUA will remain  in effect (meaning this test can be used) for the duration of the COVID-19 declaration under Section 564(b)(1) of the Act, 21 U.S.C.section 360bbb-3(b)(1), unless the authorization is terminated  or revoked sooner.       Influenza A by PCR NEGATIVE NEGATIVE Final   Influenza B by PCR NEGATIVE NEGATIVE Final    Comment: (NOTE) The Xpert Xpress SARS-CoV-2/FLU/RSV plus assay is intended as an aid in the diagnosis of influenza from Nasopharyngeal swab specimens and should not be used as a sole basis for treatment. Nasal washings and aspirates are unacceptable for Xpert Xpress SARS-CoV-2/FLU/RSV testing.  Fact Sheet for Patients: BloggerCourse.com  Fact Sheet for Healthcare Providers: SeriousBroker.it  This test is not yet approved or cleared by the [place] FDA and has been authorized for detection and/or diagnosis of SARS-CoV-2 by FDA under an Emergency Use Authorization (EUA). This EUA will remain in effect (meaning this test can be used) for the duration of the COVID-19 declaration  under Section 564(b)(1) of the Act, 21 U.S.C. section 360bbb-3(b)(1), unless the authorization is terminated or revoked.  Performed at [facility], [address] [place], [place] [address]   Culture, blood (routine x 2)     Status: Abnormal   Collection Time: 08/26/20  6:43 AM   Specimen: BLOOD RIGHT HAND  Result Value Ref Range Status   Specimen Description   Final    BLOOD RIGHT HAND Performed at [facility], [address], [place], [place] [address]    Special Requests   Final    BOTTLES DRAWN AEROBIC AND ANAEROBIC Blood Culture results may not be optimal due to an inadequate volume of blood received in culture bottles  Performed at [facility], [address] [place], [place] [address]    Culture  Setup Time   Final    GRAM POSITIVE COCCI IN CHAINS IN BOTH AEROBIC AND ANAEROBIC BOTTLES    Culture (A)  Final    ENTEROCOCCUS FAECALIS SUSCEPTIBILITIES PERFORMED ON PREVIOUS CULTURE WITHIN THE LAST 5 DAYS. Performed at [facility] [place], [address] [place], [place] [address]    Report Status 08/29/2020 FINAL  Final  Blood culture (routine x 2)     Status: Abnormal   Collection Time: 08/28/20 10:14 PM   Specimen: BLOOD  Result Value Ref Range Status   Specimen Description   Final    BLOOD RIGHT HAND Performed at [facility], [address], [place], [place] [address]    Special Requests   Final    BOTTLES DRAWN AEROBIC AND ANAEROBIC Blood Culture adequate volume Performed at [facility], [address] [place], [place] [address]    Culture  Setup Time   Final    GRAM POSITIVE COCCI IN CHAINS AEROBIC BOTTLE ONLY Organism ID to follow CRITICAL RESULT CALLED TO, READ BACK BY AND VERIFIED WITH: PHARMD Sheppard Penton POINDEXTER BY MESSAN H. AT 0348 ON 08/30/2020 Performed at [facility], [address], [place], [place] [address]    Culture ENTEROCOCCUS FAECALIS (A)   Final   Report Status 09/01/2020 FINAL  Final   Organism ID, Bacteria ENTEROCOCCUS FAECALIS  Final      Susceptibility   Enterococcus faecalis - MIC*    AMPICILLIN <=2 SENSITIVE Sensitive     VANCOMYCIN 1 SENSITIVE Sensitive     GENTAMICIN SYNERGY SENSITIVE Sensitive     * ENTEROCOCCUS FAECALIS  Blood Culture ID Panel (Reflexed)     Status: Abnormal   Collection Time: 08/28/20 10:14 PM  Result Value Ref Range Status   Enterococcus faecalis DETECTED (A) NOT DETECTED Final    Comment: CRITICAL RESULT CALLED TO, READ BACK BY AND VERIFIED WITH: PHARMD Sheppard Penton [facility] AT 7169 ON 08/30/2020    Enterococcus Faecium NOT DETECTED NOT DETECTED Final   Listeria monocytogenes NOT DETECTED NOT DETECTED Final   Staphylococcus species NOT DETECTED NOT DETECTED Final   Staphylococcus aureus (BCID) NOT DETECTED NOT DETECTED Final   Staphylococcus epidermidis NOT DETECTED NOT DETECTED Final   Staphylococcus lugdunensis NOT DETECTED NOT DETECTED Final   Streptococcus species NOT DETECTED NOT DETECTED Final   Streptococcus agalactiae NOT DETECTED NOT DETECTED Final   Streptococcus pneumoniae NOT DETECTED NOT DETECTED Final   Streptococcus pyogenes NOT DETECTED NOT DETECTED Final   A.calcoaceticus-baumannii NOT DETECTED NOT DETECTED Final   Bacteroides fragilis NOT DETECTED NOT DETECTED Final   Enterobacterales NOT DETECTED NOT DETECTED Final   Enterobacter cloacae complex NOT DETECTED NOT DETECTED Final   Escherichia coli NOT DETECTED NOT DETECTED Final   Klebsiella aerogenes NOT DETECTED NOT DETECTED Final   Klebsiella oxytoca NOT DETECTED NOT DETECTED Final   Klebsiella pneumoniae NOT DETECTED NOT DETECTED Final   Proteus species NOT DETECTED NOT DETECTED Final   Salmonella species NOT DETECTED NOT DETECTED Final   Serratia marcescens NOT DETECTED NOT DETECTED Final   Haemophilus influenzae NOT DETECTED NOT DETECTED Final   Neisseria meningitidis NOT DETECTED NOT DETECTED Final    Pseudomonas aeruginosa NOT DETECTED NOT DETECTED Final   Stenotrophomonas maltophilia NOT DETECTED NOT DETECTED Final   Candida albicans NOT DETECTED NOT DETECTED Final   Candida auris NOT DETECTED NOT DETECTED Final  Candida glabrata NOT DETECTED NOT DETECTED Final   Candida krusei NOT DETECTED NOT DETECTED Final   Candida parapsilosis NOT DETECTED NOT DETECTED Final   Candida tropicalis NOT DETECTED NOT DETECTED Final   Cryptococcus neoformans/gattii NOT DETECTED NOT DETECTED Final   Vancomycin resistance NOT DETECTED NOT DETECTED Final    Comment: Performed at [facility], [address] [place], [place] [address]  Blood culture (routine x 2)     Status: None (Preliminary result)   Collection Time: 08/28/20 10:19 PM   Specimen: BLOOD  Result Value Ref Range Status   Specimen Description   Final    BLOOD LEFT HAND Performed at [facility], [address] [place], [place] [address]    Special Requests   Final    BOTTLES DRAWN AEROBIC ONLY Blood Culture results may not be optimal due to an inadequate volume of blood received in culture bottles Performed at [facility], [address], [place], [place] [address]    Culture   Final    NO GROWTH 4 DAYS Performed at [facility], [address] [place], [place] [address]    Report Status PENDING  Incomplete  Resp Panel by RT-PCR (Flu A&B, Covid) Nasopharyngeal Swab     Status: None   Collection Time: 08/28/20 11:38 PM   Specimen: Nasopharyngeal Swab; Nasopharyngeal(NP) swabs in vial transport medium  Result Value Ref Range Status   SARS Coronavirus 2 by RT PCR NEGATIVE NEGATIVE Final    Comment: (NOTE) SARS-CoV-2 target nucleic acids are NOT DETECTED.  The SARS-CoV-2 RNA is generally detectable in upper respiratory specimens during the acute phase of infection. The lowest concentration of SARS-CoV-2 viral copies this assay can detect is 138 copies/mL. A negative result does  not preclude SARS-Cov-2 infection and should not be used as the sole basis for treatment or other patient management decisions. A negative result may occur with  improper specimen collection/handling, submission of specimen other than nasopharyngeal swab, presence of viral mutation(s) within the areas targeted by this assay, and inadequate number of viral copies(<138 copies/mL). A negative result must be combined with clinical observations, patient history, and epidemiological information. The expected result is Negative.  Fact Sheet for Patients:  BloggerCourse.com  Fact Sheet for Healthcare Providers:  SeriousBroker.it  This test is no t yet approved or cleared by the [place] FDA and  has been authorized for detection and/or diagnosis of SARS-CoV-2 by FDA under an Emergency Use Authorization (EUA). This EUA will remain  in effect (meaning this test can be used) for the duration of the COVID-19 declaration under Section 564(b)(1) of the Act, 21 U.S.C.section 360bbb-3(b)(1), unless the authorization is terminated  or revoked sooner.       Influenza A by PCR NEGATIVE NEGATIVE Final   Influenza B by PCR NEGATIVE NEGATIVE Final    Comment: (NOTE) The Xpert Xpress SARS-CoV-2/FLU/RSV plus assay is intended as an aid in the diagnosis of influenza from Nasopharyngeal swab specimens and should not be used as a sole basis for treatment. Nasal washings and aspirates are unacceptable for Xpert Xpress SARS-CoV-2/FLU/RSV testing.  Fact Sheet for Patients: BloggerCourse.com  Fact Sheet for Healthcare Providers: SeriousBroker.it  This test is not yet approved or cleared by the [place] FDA and has been authorized for detection and/or diagnosis of SARS-CoV-2 by FDA under an Emergency Use Authorization (EUA). This EUA will remain in effect (meaning this test can be used) for the  duration of the COVID-19  declaration under Section 564(b)(1) of the Act, 21 U.S.C. section 360bbb-3(b)(1), unless the authorization is terminated or revoked.  Performed at [facility], [address] [place], [place] [address]   Urine culture     Status: Abnormal   Collection Time: 08/29/20  2:45 AM   Specimen: Urine, Clean Catch  Result Value Ref Range Status   Specimen Description   Final    URINE, CLEAN CATCH Performed at [facility], [address], [place], [place] [address]    Special Requests   Final    NONE Performed at [facility], [address] [place], [place] [address]    Culture (A)  Final    <10,000 COLONIES/mL INSIGNIFICANT GROWTH Performed at [facility], [address] [place], [place] [address]    Report Status 08/30/2020 FINAL  Final  Culture, blood (routine x 2)     Status: None (Preliminary result)   Collection Time: 08/30/20 11:38 PM   Specimen: BLOOD  Result Value Ref Range Status   Specimen Description   Final    BLOOD BLOOD LEFT FOREARM Performed at [facility], [address] [place], [place] [address]    Special Requests   Final    BOTTLES DRAWN AEROBIC ONLY Blood Culture adequate volume Performed at [facility], [address] [place], [place] [address]    Culture   Final    NO GROWTH 2 DAYS Performed at [facility], [address] [place], [place] [address]    Report Status PENDING  Incomplete  Culture, blood (routine x 2)     Status: None (Preliminary result)   Collection Time: 08/30/20 11:38 PM   Specimen: BLOOD  Result Value Ref Range Status   Specimen Description   Final    BLOOD LEFT FINGER Performed at [facility], [address], [place], [place] [address]    Special Requests   Final    BOTTLES DRAWN AEROBIC ONLY Blood Culture adequate volume Performed at [facility], [address], [place], [place] [address]    Culture   Final    NO GROWTH 2 DAYS Performed at [facility], [address] [place], [place] [address]    Report Status PENDING  Incomplete    Studies/Results: No results found.  Medications: Scheduled Meds: . abacavir-dolutegravir-lamiVUDine  1 tablet Oral QHS  . apixaban  5 mg Oral BID  . HYDROmorphone   Intravenous Q4H  . hydroxyurea  2,000 mg Oral QHS  . multivitamin with minerals  1 tablet Oral Q breakfast  . polyethylene glycol  17 g Oral Daily  . senna-docusate  2 tablet Oral BID   Continuous Infusions: . ampicillin-sulbactam (UNASYN) IV 3 g (09/02/20 1228)  . diphenhydrAMINE 25 mg (09/02/20 1406)   PRN Meds:.ALPRAZolam, diphenhydrAMINE **OR** diphenhydrAMINE, lip balm, naloxone **AND** sodium chloride flush, ondansetron, oxyCODONE-acetaminophen **AND** oxyCODONE  Consultants:  Infectious disease  Procedures:  Completed TEE  Port removed  Antibiotics:  IV Unasyn  Assessment/Plan: Principal Problem:   Bacteremia due to Enterococcus Active Problems:   HIV (human immunodeficiency virus infection) (HCC)   History of pulmonary embolus (PE)   Sickle cell anemia with pain (HCC)  Enterococcal bacteremia: Patient followed by infectious disease specialist.  Unasyn continued.  Port-A-Cath removal.  TEE negative for any vegetation.  Patient has been afebrile.  Blood cultures negative. Patient will discharge home on antibiotics, will defer to infectious disease.  Sickle cell disease with pain crisis: Continue IV fluids at [facility] IV Dilaudid PCA Toradol contraindicated due to allergy Percocet 10-325 mg every 4 hours for breakthrough pain Monitor vital signs very closely, reevaluate pain scale regularly, and supplemental oxygen as needed.  Leukocytosis: Improving.  Patient remains afebrile.  Continue IV antibiotics.  Continue to follow closely.  Sickle cell anemia: Patient's hemoglobin remained stable and  consistent with his baseline.  There is no clinical indication for blood transfusion at this time.  Chronic pain syndrome: Continue home medications  History of PE: Continue Eliquis  Asymptomatic HIV: Continue HAART  Code Status: Full Code Family Communication: N/A Disposition Plan: Not yet ready for discharge  Megen Madewell Rennis Petty  APRN, [name], [name] [facility] [address] [place], [place] [phone]  If 5PM-8AM, please contact night-coverage.  09/02/2020, 5:14 PM  LOS: 4 days

## 2020-09-02 NOTE — Progress Notes (Signed)
[facility]   Reason for visit: Follow up on bacteremia  Interval History: repeat blood cultures remain ngtd.  WBC 12.3, afebrile.  No new significant events. Some sob.   Day 4 amp/sulbactam   Physical Exam: Constitutional:  Vitals:   09/02/20 0952 09/02/20 1229  BP: (!) 104/58   Pulse: 87   Resp: 14 15  Temp: 97.8 F (36.6 C)   SpO2: 92% 94%   patient appears in NAD Eyes: anicteric HENT: + nasal cannula Respiratory: Normal respiratory effort; CTA B Cardiovascular: RRR GI: soft, nt, nd  Review of Systems: Constitutional: negative for fevers and chills Gastrointestinal: negative for nausea and diarrhea Integument/breast: negative for rash  Lab Results  Component Value Date   WBC 12.3 (H) 09/02/2020   HGB 7.6 (L) 09/02/2020   HCT 4.7 (L) 09/02/2020   MCV 90.4 09/02/2020   PLT 223 09/02/2020    Lab Results  Component Value Date   CREATININE 1.09 09/02/2020   BUN 15 09/02/2020   NA 134 (L) 09/02/2020   K 4.7 09/02/2020   CL 101 09/02/2020   CO2 27 09/02/2020    Lab Results  Component Value Date   ALT 16 08/28/2020   AST 36 08/28/2020   ALKPHOS 57 08/28/2020     Microbiology: Recent Results (from the past 240 hour(s))  Culture, blood (routine x 2)     Status: Abnormal   Collection Time: 08/26/20  6:38 AM   Specimen: BLOOD  Result Value Ref Range Status   Specimen Description   Final    BLOOD PORTA CATH Performed at [facility], [address] [place], [place] [address]    Special Requests   Final    BOTTLES DRAWN AEROBIC AND ANAEROBIC Blood Culture adequate volume Performed at [facility], [address], [place], [place] [address]    Culture  Setup Time   Final    GRAM POSITIVE COCCI IN CHAINS IN BOTH AEROBIC AND ANAEROBIC BOTTLES CRITICAL RESULT CALLED TO, READ BACK BY AND VERIFIED WITH: DR Hart [place] [id number] FCP    Culture (A)  Final    ENTEROCOCCUS FAECALIS STAPHYLOCOCCUS  AUREUS CRITICAL RESULT CALLED TO, READ BACK BY AND VERIFIED WITH: RN K.FREEMAN AT 1157 ON 08/27/2020 BY T.SAAD Performed at [facility], [address], [place], [place] [address]    Report Status 08/30/2020 FINAL  Final   Organism ID, Bacteria ENTEROCOCCUS FAECALIS  Final   Organism ID, Bacteria STAPHYLOCOCCUS AUREUS  Final      Susceptibility   Enterococcus faecalis - MIC*    AMPICILLIN <=2 SENSITIVE Sensitive     VANCOMYCIN 1 SENSITIVE Sensitive     GENTAMICIN SYNERGY SENSITIVE Sensitive     * ENTEROCOCCUS FAECALIS   Staphylococcus aureus - MIC*    CIPROFLOXACIN <=0.5 SENSITIVE Sensitive     ERYTHROMYCIN >=8 RESISTANT Resistant     GENTAMICIN <=0.5 SENSITIVE Sensitive     OXACILLIN <=0.25 SENSITIVE Sensitive     TETRACYCLINE <=1 SENSITIVE Sensitive     VANCOMYCIN 1 SENSITIVE Sensitive     TRIMETH/SULFA <=10 SENSITIVE Sensitive     CLINDAMYCIN <=0.25 SENSITIVE Sensitive     RIFAMPIN <=0.5 SENSITIVE Sensitive     Inducible Clindamycin NEGATIVE Sensitive     * STAPHYLOCOCCUS AUREUS  Blood Culture ID Panel (Reflexed)     Status: Abnormal   Collection Time: 08/26/20  6:38 AM  Result Value Ref Range Status   Enterococcus faecalis DETECTED (A) NOT  DETECTED Final    Comment: CRITICAL RESULT CALLED TO, READ BACK BY AND VERIFIED WITH: DR HOLLIS [phone] [id number] FCP    Enterococcus Faecium NOT DETECTED NOT DETECTED Final   Listeria monocytogenes NOT DETECTED NOT DETECTED Final   Staphylococcus species NOT DETECTED NOT DETECTED Final   Staphylococcus aureus (BCID) NOT DETECTED NOT DETECTED Final   Staphylococcus epidermidis NOT DETECTED NOT DETECTED Final   Staphylococcus lugdunensis NOT DETECTED NOT DETECTED Final   Streptococcus species NOT DETECTED NOT DETECTED Final   Streptococcus agalactiae NOT DETECTED NOT DETECTED Final   Streptococcus pneumoniae NOT DETECTED NOT DETECTED Final   Streptococcus pyogenes NOT DETECTED NOT DETECTED Final   A.calcoaceticus-baumannii NOT DETECTED  NOT DETECTED Final   Bacteroides fragilis NOT DETECTED NOT DETECTED Final   Enterobacterales NOT DETECTED NOT DETECTED Final   Enterobacter cloacae complex NOT DETECTED NOT DETECTED Final   Escherichia coli NOT DETECTED NOT DETECTED Final   Klebsiella aerogenes NOT DETECTED NOT DETECTED Final   Klebsiella oxytoca NOT DETECTED NOT DETECTED Final   Klebsiella pneumoniae NOT DETECTED NOT DETECTED Final   Proteus species NOT DETECTED NOT DETECTED Final   Salmonella species NOT DETECTED NOT DETECTED Final   Serratia marcescens NOT DETECTED NOT DETECTED Final   Haemophilus influenzae NOT DETECTED NOT DETECTED Final   Neisseria meningitidis NOT DETECTED NOT DETECTED Final   Pseudomonas aeruginosa NOT DETECTED NOT DETECTED Final   Stenotrophomonas maltophilia NOT DETECTED NOT DETECTED Final   Candida albicans NOT DETECTED NOT DETECTED Final   Candida auris NOT DETECTED NOT DETECTED Final   Candida glabrata NOT DETECTED NOT DETECTED Final   Candida krusei NOT DETECTED NOT DETECTED Final   Candida parapsilosis NOT DETECTED NOT DETECTED Final   Candida tropicalis NOT DETECTED NOT DETECTED Final   Cryptococcus neoformans/gattii NOT DETECTED NOT DETECTED Final   Meth resistant mecA/C and MREJ NOT DETECTED NOT DETECTED Final   Vancomycin resistance NOT DETECTED NOT DETECTED Final    Comment: Performed at [facility], [address] [place], [place] [address]  Resp Panel by RT-PCR (Flu A&B, Covid) Nasopharyngeal Swab     Status: None   Collection Time: 08/26/20  6:39 AM   Specimen: Nasopharyngeal Swab; Nasopharyngeal(NP) swabs in vial transport medium  Result Value Ref Range Status   SARS Coronavirus 2 by RT PCR NEGATIVE NEGATIVE Final    Comment: (NOTE) SARS-CoV-2 target nucleic acids are NOT DETECTED.  The SARS-CoV-2 RNA is generally detectable in upper respiratory specimens during the acute phase of infection. The lowest concentration of SARS-CoV-2 viral copies this assay can detect  is 138 copies/mL. A negative result does not preclude SARS-Cov-2 infection and should not be used as the sole basis for treatment or other patient management decisions. A negative result may occur with  improper specimen collection/handling, submission of specimen other than nasopharyngeal swab, presence of viral mutation(s) within the areas targeted by this assay, and inadequate number of viral copies(<138 copies/mL). A negative result must be combined with clinical observations, patient history, and epidemiological information. The expected result is Negative.  Fact Sheet for Patients:  BloggerCourse.com  Fact Sheet for Healthcare Providers:  SeriousBroker.it  This test is no t yet approved or cleared by the [place] FDA and  has been authorized for detection and/or diagnosis of SARS-CoV-2 by FDA under an Emergency Use Authorization (EUA). This EUA will remain  in effect (meaning this test can be used) for the duration of the COVID-19 declaration under Section 564(b)(1) of the Act, 21 U.S.C.section  360bbb-3(b)(1), unless the authorization is terminated  or revoked sooner.       Influenza A by PCR NEGATIVE NEGATIVE Final   Influenza B by PCR NEGATIVE NEGATIVE Final    Comment: (NOTE) The Xpert Xpress SARS-CoV-2/FLU/RSV plus assay is intended as an aid in the diagnosis of influenza from Nasopharyngeal swab specimens and should not be used as a sole basis for treatment. Nasal washings and aspirates are unacceptable for Xpert Xpress SARS-CoV-2/FLU/RSV testing.  Fact Sheet for Patients: BloggerCourse.com  Fact Sheet for Healthcare Providers: SeriousBroker.it  This test is not yet approved or cleared by the [place] FDA and has been authorized for detection and/or diagnosis of SARS-CoV-2 by FDA under an Emergency Use Authorization (EUA). This EUA will remain in effect  (meaning this test can be used) for the duration of the COVID-19 declaration under Section 564(b)(1) of the Act, 21 U.S.C. section 360bbb-3(b)(1), unless the authorization is terminated or revoked.  Performed at [facility], [address], [place], [place] [address]   Culture, blood (routine x 2)     Status: Abnormal   Collection Time: 08/26/20  6:43 AM   Specimen: BLOOD RIGHT HAND  Result Value Ref Range Status   Specimen Description   Final    BLOOD RIGHT HAND Performed at [facility], [address], [place], [place] [address]    Special Requests   Final    BOTTLES DRAWN AEROBIC AND ANAEROBIC Blood Culture results may not be optimal due to an inadequate volume of blood received in culture bottles Performed at [facility], [address] [place], [place] [address]    Culture  Setup Time   Final    GRAM POSITIVE COCCI IN CHAINS IN BOTH AEROBIC AND ANAEROBIC BOTTLES    Culture (A)  Final    ENTEROCOCCUS FAECALIS SUSCEPTIBILITIES PERFORMED ON PREVIOUS CULTURE WITHIN THE LAST 5 DAYS. Performed at [facility], [address] [place], [place] [address]    Report Status 08/29/2020 FINAL  Final  Blood culture (routine x 2)     Status: Abnormal   Collection Time: 08/28/20 10:14 PM   Specimen: BLOOD  Result Value Ref Range Status   Specimen Description   Final    BLOOD RIGHT HAND Performed at [facility], [address], [place], [place] [address]    Special Requests   Final    BOTTLES DRAWN AEROBIC AND ANAEROBIC Blood Culture adequate volume Performed at [facility], [address] [place], [place] [address]    Culture  Setup Time   Final    GRAM POSITIVE COCCI IN CHAINS AEROBIC BOTTLE ONLY Organism ID to follow CRITICAL RESULT CALLED TO, READ BACK BY AND VERIFIED WITH: PHARMD Sheppard Penton POINDEXTER BY MESSAN H. AT 0348 ON 08/30/2020 Performed at [facility],  [address], [place], [place] [address]    Culture ENTEROCOCCUS FAECALIS (A)  Final   Report Status 09/01/2020 FINAL  Final   Organism ID, Bacteria ENTEROCOCCUS FAECALIS  Final      Susceptibility   Enterococcus faecalis - MIC*    AMPICILLIN <=2 SENSITIVE Sensitive     VANCOMYCIN 1 SENSITIVE Sensitive     GENTAMICIN SYNERGY SENSITIVE Sensitive     * ENTEROCOCCUS FAECALIS  Blood Culture ID Panel (Reflexed)     Status: Abnormal   Collection Time: 08/28/20 10:14 PM  Result Value Ref Range Status   Enterococcus faecalis DETECTED (A) NOT DETECTED Final  Comment: CRITICAL RESULT CALLED TO, READ BACK BY AND VERIFIED WITH: PHARMD Sheppard Penton [facility] BY [facility] AT 4356 ON 08/30/2020    Enterococcus Faecium NOT DETECTED NOT DETECTED Final   Listeria monocytogenes NOT DETECTED NOT DETECTED Final   Staphylococcus species NOT DETECTED NOT DETECTED Final   Staphylococcus aureus (BCID) NOT DETECTED NOT DETECTED Final   Staphylococcus epidermidis NOT DETECTED NOT DETECTED Final   Staphylococcus lugdunensis NOT DETECTED NOT DETECTED Final   Streptococcus species NOT DETECTED NOT DETECTED Final   Streptococcus agalactiae NOT DETECTED NOT DETECTED Final   Streptococcus pneumoniae NOT DETECTED NOT DETECTED Final   Streptococcus pyogenes NOT DETECTED NOT DETECTED Final   A.calcoaceticus-baumannii NOT DETECTED NOT DETECTED Final   Bacteroides fragilis NOT DETECTED NOT DETECTED Final   Enterobacterales NOT DETECTED NOT DETECTED Final   Enterobacter cloacae complex NOT DETECTED NOT DETECTED Final   Escherichia coli NOT DETECTED NOT DETECTED Final   Klebsiella aerogenes NOT DETECTED NOT DETECTED Final   Klebsiella oxytoca NOT DETECTED NOT DETECTED Final   Klebsiella pneumoniae NOT DETECTED NOT DETECTED Final   Proteus species NOT DETECTED NOT DETECTED Final   Salmonella species NOT DETECTED NOT DETECTED Final   Serratia marcescens NOT DETECTED NOT DETECTED Final   Haemophilus influenzae NOT DETECTED NOT  DETECTED Final   Neisseria meningitidis NOT DETECTED NOT DETECTED Final   Pseudomonas aeruginosa NOT DETECTED NOT DETECTED Final   Stenotrophomonas maltophilia NOT DETECTED NOT DETECTED Final   Candida albicans NOT DETECTED NOT DETECTED Final   Candida auris NOT DETECTED NOT DETECTED Final   Candida glabrata NOT DETECTED NOT DETECTED Final   Candida krusei NOT DETECTED NOT DETECTED Final   Candida parapsilosis NOT DETECTED NOT DETECTED Final   Candida tropicalis NOT DETECTED NOT DETECTED Final   Cryptococcus neoformans/gattii NOT DETECTED NOT DETECTED Final   Vancomycin resistance NOT DETECTED NOT DETECTED Final    Comment: Performed at [facility], [address] [place], [place] [address]  Blood culture (routine x 2)     Status: None (Preliminary result)   Collection Time: 08/28/20 10:19 PM   Specimen: BLOOD  Result Value Ref Range Status   Specimen Description   Final    BLOOD LEFT HAND Performed at [facility], [address] [place], [place] [address]    Special Requests   Final    BOTTLES DRAWN AEROBIC ONLY Blood Culture results may not be optimal due to an inadequate volume of blood received in culture bottles Performed at [facility], [address], [place], [place] [address]    Culture   Final    NO GROWTH 3 DAYS Performed at [facility], [address] [place], [place] [address]    Report Status PENDING  Incomplete  Resp Panel by RT-PCR (Flu A&B, Covid) Nasopharyngeal Swab     Status: None   Collection Time: 08/28/20 11:38 PM   Specimen: Nasopharyngeal Swab; Nasopharyngeal(NP) swabs in vial transport medium  Result Value Ref Range Status   SARS Coronavirus 2 by RT PCR NEGATIVE NEGATIVE Final    Comment: (NOTE) SARS-CoV-2 target nucleic acids are NOT DETECTED.  The SARS-CoV-2 RNA is generally detectable in upper respiratory specimens during the acute phase of infection. The lowest concentration of SARS-CoV-2  viral copies this assay can detect is 138 copies/mL. A negative result does not preclude SARS-Cov-2 infection and should not be used as the sole basis for treatment or other patient management decisions. A negative result may occur with  improper specimen collection/handling, submission of specimen other than nasopharyngeal swab, presence of viral mutation(s) within the areas targeted by this assay, and inadequate number of viral copies(<138 copies/mL). A negative result must be combined with clinical observations, patient history, and epidemiological information. The expected result is Negative.  Fact Sheet for Patients:  BloggerCourse.com  Fact Sheet for Healthcare Providers:  SeriousBroker.it  This test is no t yet approved or cleared by the [place] FDA and  has been authorized for detection and/or diagnosis of SARS-CoV-2 by FDA under an Emergency Use Authorization (EUA). This EUA will remain  in effect (meaning this test can be used) for the duration of the COVID-19 declaration under Section 564(b)(1) of the Act, 21 U.S.C.section 360bbb-3(b)(1), unless the authorization is terminated  or revoked sooner.       Influenza A by PCR NEGATIVE NEGATIVE Final   Influenza B by PCR NEGATIVE NEGATIVE Final    Comment: (NOTE) The Xpert Xpress SARS-CoV-2/FLU/RSV plus assay is intended as an aid in the diagnosis of influenza from Nasopharyngeal swab specimens and should not be used as a sole basis for treatment. Nasal washings and aspirates are unacceptable for Xpert Xpress SARS-CoV-2/FLU/RSV testing.  Fact Sheet for Patients: BloggerCourse.com  Fact Sheet for Healthcare Providers: SeriousBroker.it  This test is not yet approved or cleared by the [place] FDA and has been authorized for detection and/or diagnosis of SARS-CoV-2 by FDA under an Emergency Use Authorization  (EUA). This EUA will remain in effect (meaning this test can be used) for the duration of the COVID-19 declaration under Section 564(b)(1) of the Act, 21 U.S.C. section 360bbb-3(b)(1), unless the authorization is terminated or revoked.  Performed at [facility], [address] [place], [place] [address]   Urine culture     Status: Abnormal   Collection Time: 08/29/20  2:45 AM   Specimen: Urine, Clean Catch  Result Value Ref Range Status   Specimen Description   Final    URINE, CLEAN CATCH Performed at [facility], [address] [place], [place] [address]    Special Requests   Final    NONE Performed at [facility], [address] [place], [place] [address]    Culture (A)  Final    <10,000 COLONIES/mL INSIGNIFICANT GROWTH Performed at [facility], [address] [place], [place] [address]    Report Status 08/30/2020 FINAL  Final  Culture, blood (routine x 2)     Status: None (Preliminary result)   Collection Time: 08/30/20 11:38 PM   Specimen: BLOOD  Result Value Ref Range Status   Specimen Description   Final    BLOOD BLOOD LEFT FOREARM Performed at [facility], [address], [place], [place] [address]    Special Requests   Final    BOTTLES DRAWN AEROBIC ONLY Blood Culture adequate volume Performed at [facility], [address], [place], [place] [address]    Culture   Final    NO GROWTH 1 DAY Performed at [facility], [address], [place], [place] [address]    Report Status PENDING  Incomplete  Culture, blood (routine x 2)     Status: None (Preliminary result)   Collection Time: 08/30/20 11:38 PM   Specimen: BLOOD  Result Value Ref Range Status   Specimen Description   Final    BLOOD LEFT FINGER Performed at [facility], [address], [place], [place] [address]  Special Requests   Final    BOTTLES DRAWN AEROBIC ONLY  Blood Culture adequate volume Performed at [facility], [address] [place], [place] [id number]    Culture   Final    NO GROWTH 1 DAY Performed at [facility], [address] [place], [place] [address]    Report Status PENDING  Incomplete    Impression/Plan:  1. Port-a-cath line infection - positive ampicillin sensitive Enterococcus faecalis in blood cultures and one bottle with MSSA, though did not appear on BCID.   On treatment with ampicillin/sulbactam and will continue.  Port-a-cath removed. TEE negative for vegetation.  Plan to continue with amp/sulbactam inpatient and he can be discharged on oral linezolid to complete 10 days total antibiotics from port removal through May 22nd.   [place].   2.  Bacteremia - from #1 above and repeat blood cultures on 5/13 remain ngtd.    3.  HIV - on Triumeq and no changes.  Last CD4 624 in March.

## 2020-09-02 NOTE — Plan of Care (Signed)
  Problem: Education: Goal: Awareness of infection prevention will improve Outcome: Progressing   Problem: Tissue Perfusion: Goal: Complications related to inadequate tissue perfusion will be avoided or minimized Outcome: Progressing   Problem: Respiratory: Goal: Pulmonary complications will be avoided or minimized Outcome: Progressing   Problem: Fluid Volume: Goal: Ability to maintain a balanced intake and output will improve Outcome: Progressing

## 2020-09-02 NOTE — Progress Notes (Signed)
Pt remained awake most of shift, continued to pick at right thumb, states he does it due to anxiety. Medicated for anxiety this shift with no improvement. Cleansed right thumb with normal saline, dried and dressing applied. Educated pt. On the importance of not removing and playing/picking at thumb due to risk for infection. Pt. States it's a habit and will try not to. Pt is a mouth breather, entire shift encouraged pt. To breathe through his nose, oxygen saturation continues to fluctuates due to mouth breathing. Increased oxygen to 3L to maintain O2 saturation >90%. Pt declines TCDB, will continue to encourage and educate pt. On the benefits. No distress noted, will continue to monitor pt. Closely. Erle Crocker RN

## 2020-09-02 NOTE — Telephone Encounter (Signed)
[facility] Patient Advocate Encounter   Received notification from [facility]  that prior authorization for Zyvox is required.   PA submitted on 09/02/20 Key [id number] Status is pending    [facility] will continue to follow.   Clearance Coots, CPhT Specialty Pharmacy Patient [facility] Phone: [phone] Fax:  [phone]

## 2020-09-03 LAB — CULTURE, BLOOD (ROUTINE X 2): Culture: NO GROWTH

## 2020-09-03 NOTE — Progress Notes (Signed)
[facility]   Reason for visit: Follow up on bacteremia  Interval History: repeat blood cultures remain negative. Afebrile.  Day 5 amp/sulbactam  Physical Exam: Constitutional:  Vitals:   09/03/20 1412 09/03/20 1557  BP: 106/62   Pulse: 91   Resp: 17 12  Temp: 98.2 F (36.8 C)   SpO2: 91% 96%   patient appears in NAD Respiratory: normal respiratory effort on nasal cannula Cardiovascular: RRR GI: soft, nt, nd  Review of Systems: Constitutional: negative for fevers and chills Gastrointestinal: negative for nausea and diarrhea Integument/breast: negative for rash  Lab Results  Component Value Date   WBC 12.3 (H) 09/02/2020   HGB 7.6 (L) 09/02/2020   HCT 4.7 (L) 09/02/2020   MCV 90.4 09/02/2020   PLT 223 09/02/2020    Lab Results  Component Value Date   CREATININE 1.09 09/02/2020   BUN 15 09/02/2020   NA 134 (L) 09/02/2020   K 4.7 09/02/2020   CL 101 09/02/2020   CO2 27 09/02/2020    Lab Results  Component Value Date   ALT 16 08/28/2020   AST 36 08/28/2020   ALKPHOS 57 08/28/2020     Microbiology: Recent Results (from the past 240 hour(s))  Culture, blood (routine x 2)     Status: Abnormal   Collection Time: 08/26/20  6:38 AM   Specimen: BLOOD  Result Value Ref Range Status   Specimen Description   Final    BLOOD PORTA CATH Performed at [facility], [address] [place], [place] [address]    Special Requests   Final    BOTTLES DRAWN AEROBIC AND ANAEROBIC Blood Culture adequate volume Performed at [facility], [address] [place], [place] [address]    Culture  Setup Time   Final    GRAM POSITIVE COCCI IN CHAINS IN BOTH AEROBIC AND ANAEROBIC BOTTLES CRITICAL RESULT CALLED TO, READ BACK BY AND VERIFIED WITH: DR Hart [place] [id number] FCP    Culture (A)  Final    ENTEROCOCCUS FAECALIS STAPHYLOCOCCUS AUREUS CRITICAL RESULT CALLED TO, READ BACK BY AND VERIFIED WITH: RN K.FREEMAN  AT 1157 ON 08/27/2020 BY T.SAAD Performed at [facility], [address] [place], [place] [address]    Report Status 08/30/2020 FINAL  Final   Organism ID, Bacteria ENTEROCOCCUS FAECALIS  Final   Organism ID, Bacteria STAPHYLOCOCCUS AUREUS  Final      Susceptibility   Enterococcus faecalis - MIC*    AMPICILLIN <=2 SENSITIVE Sensitive     VANCOMYCIN 1 SENSITIVE Sensitive     GENTAMICIN SYNERGY SENSITIVE Sensitive     * ENTEROCOCCUS FAECALIS   Staphylococcus aureus - MIC*    CIPROFLOXACIN <=0.5 SENSITIVE Sensitive     ERYTHROMYCIN >=8 RESISTANT Resistant     GENTAMICIN <=0.5 SENSITIVE Sensitive     OXACILLIN <=0.25 SENSITIVE Sensitive     TETRACYCLINE <=1 SENSITIVE Sensitive     VANCOMYCIN 1 SENSITIVE Sensitive     TRIMETH/SULFA <=10 SENSITIVE Sensitive     CLINDAMYCIN <=0.25 SENSITIVE Sensitive     RIFAMPIN <=0.5 SENSITIVE Sensitive     Inducible Clindamycin NEGATIVE Sensitive     * STAPHYLOCOCCUS AUREUS  Blood Culture ID Panel (Reflexed)     Status: Abnormal   Collection Time: 08/26/20  6:38 AM  Result Value Ref Range Status   Enterococcus faecalis DETECTED (A) NOT DETECTED Final    Comment: CRITICAL RESULT CALLED TO, READ BACK BY AND VERIFIED WITH: DR Hart Rochester  [phone] FCP    Enterococcus Faecium NOT DETECTED NOT DETECTED Final   Listeria monocytogenes NOT DETECTED NOT DETECTED Final   Staphylococcus species NOT DETECTED NOT DETECTED Final   Staphylococcus aureus (BCID) NOT DETECTED NOT DETECTED Final   Staphylococcus epidermidis NOT DETECTED NOT DETECTED Final   Staphylococcus lugdunensis NOT DETECTED NOT DETECTED Final   Streptococcus species NOT DETECTED NOT DETECTED Final   Streptococcus agalactiae NOT DETECTED NOT DETECTED Final   Streptococcus pneumoniae NOT DETECTED NOT DETECTED Final   Streptococcus pyogenes NOT DETECTED NOT DETECTED Final   A.calcoaceticus-baumannii NOT DETECTED NOT DETECTED Final   Bacteroides fragilis NOT DETECTED NOT DETECTED Final    Enterobacterales NOT DETECTED NOT DETECTED Final   Enterobacter cloacae complex NOT DETECTED NOT DETECTED Final   Escherichia coli NOT DETECTED NOT DETECTED Final   Klebsiella aerogenes NOT DETECTED NOT DETECTED Final   Klebsiella oxytoca NOT DETECTED NOT DETECTED Final   Klebsiella pneumoniae NOT DETECTED NOT DETECTED Final   Proteus species NOT DETECTED NOT DETECTED Final   Salmonella species NOT DETECTED NOT DETECTED Final   Serratia marcescens NOT DETECTED NOT DETECTED Final   Haemophilus influenzae NOT DETECTED NOT DETECTED Final   Neisseria meningitidis NOT DETECTED NOT DETECTED Final   Pseudomonas aeruginosa NOT DETECTED NOT DETECTED Final   Stenotrophomonas maltophilia NOT DETECTED NOT DETECTED Final   Candida albicans NOT DETECTED NOT DETECTED Final   Candida auris NOT DETECTED NOT DETECTED Final   Candida glabrata NOT DETECTED NOT DETECTED Final   Candida krusei NOT DETECTED NOT DETECTED Final   Candida parapsilosis NOT DETECTED NOT DETECTED Final   Candida tropicalis NOT DETECTED NOT DETECTED Final   Cryptococcus neoformans/gattii NOT DETECTED NOT DETECTED Final   Meth resistant mecA/C and MREJ NOT DETECTED NOT DETECTED Final   Vancomycin resistance NOT DETECTED NOT DETECTED Final    Comment: Performed at [facility], [address], [place], [place] [address]  Resp Panel by RT-PCR (Flu A&B, Covid) Nasopharyngeal Swab     Status: None   Collection Time: 08/26/20  6:39 AM   Specimen: Nasopharyngeal Swab; Nasopharyngeal(NP) swabs in vial transport medium  Result Value Ref Range Status   SARS Coronavirus 2 by RT PCR NEGATIVE NEGATIVE Final    Comment: (NOTE) SARS-CoV-2 target nucleic acids are NOT DETECTED.  The SARS-CoV-2 RNA is generally detectable in upper respiratory specimens during the acute phase of infection. The lowest concentration of SARS-CoV-2 viral copies this assay can detect is 138 copies/mL. A negative result does not preclude SARS-Cov-2 infection and  should not be used as the sole basis for treatment or other patient management decisions. A negative result may occur with  improper specimen collection/handling, submission of specimen other than nasopharyngeal swab, presence of viral mutation(s) within the areas targeted by this assay, and inadequate number of viral copies(<138 copies/mL). A negative result must be combined with clinical observations, patient history, and epidemiological information. The expected result is Negative.  Fact Sheet for Patients:  BloggerCourse.com  Fact Sheet for Healthcare Providers:  SeriousBroker.it  This test is no t yet approved or cleared by the [place] FDA and  has been authorized for detection and/or diagnosis of SARS-CoV-2 by FDA under an Emergency Use Authorization (EUA). This EUA will remain  in effect (meaning this test can be used) for the duration of the COVID-19 declaration under Section 564(b)(1) of the Act, 21 U.S.C.section 360bbb-3(b)(1), unless the authorization is terminated  or revoked sooner.       Influenza A  by PCR NEGATIVE NEGATIVE Final   Influenza B by PCR NEGATIVE NEGATIVE Final    Comment: (NOTE) The Xpert Xpress SARS-CoV-2/FLU/RSV plus assay is intended as an aid in the diagnosis of influenza from Nasopharyngeal swab specimens and should not be used as a sole basis for treatment. Nasal washings and aspirates are unacceptable for Xpert Xpress SARS-CoV-2/FLU/RSV testing.  Fact Sheet for Patients: BloggerCourse.comhttps://www.fda.gov/media/152166/download  Fact Sheet for Healthcare Providers: SeriousBroker.ithttps://www.fda.gov/media/152162/download  This test is not yet approved or cleared by the [place] FDA and has been authorized for detection and/or diagnosis of SARS-CoV-2 by FDA under an Emergency Use Authorization (EUA). This EUA will remain in effect (meaning this test can be used) for the duration of the COVID-19 declaration  under Section 564(b)(1) of the Act, 21 U.S.C. section 360bbb-3(b)(1), unless the authorization is terminated or revoked.  Performed at [facility], [address] [place], [place] [address]   Culture, blood (routine x 2)     Status: Abnormal   Collection Time: 08/26/20  6:43 AM   Specimen: BLOOD RIGHT HAND  Result Value Ref Range Status   Specimen Description   Final    BLOOD RIGHT HAND Performed at [facility], [address] [place], [place] [address]    Special Requests   Final    BOTTLES DRAWN AEROBIC AND ANAEROBIC Blood Culture results may not be optimal due to an inadequate volume of blood received in culture bottles Performed at [facility], [address] [place], [place] [address]    Culture  Setup Time   Final    GRAM POSITIVE COCCI IN CHAINS IN BOTH AEROBIC AND ANAEROBIC BOTTLES    Culture (A)  Final    ENTEROCOCCUS FAECALIS SUSCEPTIBILITIES PERFORMED ON PREVIOUS CULTURE WITHIN THE LAST 5 DAYS. Performed at [facility], [address] [place], [place] [address]    Report Status 08/29/2020 FINAL  Final  Blood culture (routine x 2)     Status: Abnormal   Collection Time: 08/28/20 10:14 PM   Specimen: BLOOD  Result Value Ref Range Status   Specimen Description   Final    BLOOD RIGHT HAND Performed at [facility], [address] [place], [place] [address]    Special Requests   Final    BOTTLES DRAWN AEROBIC AND ANAEROBIC Blood Culture adequate volume Performed at [facility], [address] [place], [place] [address]    Culture  Setup Time   Final    GRAM POSITIVE COCCI IN CHAINS AEROBIC BOTTLE ONLY Organism ID to follow CRITICAL RESULT CALLED TO, READ BACK BY AND VERIFIED WITH: PHARMD Sheppard PentonLee Ann POINDEXTER BY MESSAN H. AT 0348 ON 08/30/2020 Performed at [facility], [address] [place], [place] [address]    Culture ENTEROCOCCUS FAECALIS (A)   Final   Report Status 09/01/2020 FINAL  Final   Organism ID, Bacteria ENTEROCOCCUS FAECALIS  Final      Susceptibility   Enterococcus faecalis - MIC*    AMPICILLIN <=2 SENSITIVE Sensitive     VANCOMYCIN 1 SENSITIVE Sensitive     GENTAMICIN SYNERGY SENSITIVE Sensitive     * ENTEROCOCCUS FAECALIS  Blood Culture ID Panel (Reflexed)     Status: Abnormal   Collection Time: 08/28/20 10:14 PM  Result Value Ref Range Status   Enterococcus faecalis DETECTED (A) NOT DETECTED Final    Comment: CRITICAL RESULT CALLED TO, READ BACK BY AND VERIFIED WITH: PHARMD Sheppard PentonLee Ann PONDEXTER BY  MESSAN AT 0348 ON 08/30/2020    Enterococcus Faecium NOT DETECTED NOT DETECTED Final   Listeria monocytogenes NOT DETECTED NOT DETECTED Final   Staphylococcus species NOT DETECTED NOT DETECTED Final   Staphylococcus aureus (BCID) NOT DETECTED NOT DETECTED Final   Staphylococcus epidermidis NOT DETECTED NOT DETECTED Final   Staphylococcus lugdunensis NOT DETECTED NOT DETECTED Final   Streptococcus species NOT DETECTED NOT DETECTED Final   Streptococcus agalactiae NOT DETECTED NOT DETECTED Final   Streptococcus pneumoniae NOT DETECTED NOT DETECTED Final   Streptococcus pyogenes NOT DETECTED NOT DETECTED Final   A.calcoaceticus-baumannii NOT DETECTED NOT DETECTED Final   Bacteroides fragilis NOT DETECTED NOT DETECTED Final   Enterobacterales NOT DETECTED NOT DETECTED Final   Enterobacter cloacae complex NOT DETECTED NOT DETECTED Final   Escherichia coli NOT DETECTED NOT DETECTED Final   Klebsiella aerogenes NOT DETECTED NOT DETECTED Final   Klebsiella oxytoca NOT DETECTED NOT DETECTED Final   Klebsiella pneumoniae NOT DETECTED NOT DETECTED Final   Proteus species NOT DETECTED NOT DETECTED Final   Salmonella species NOT DETECTED NOT DETECTED Final   Serratia marcescens NOT DETECTED NOT DETECTED Final   Haemophilus influenzae NOT DETECTED NOT DETECTED Final   Neisseria meningitidis NOT DETECTED NOT DETECTED Final    Pseudomonas aeruginosa NOT DETECTED NOT DETECTED Final   Stenotrophomonas maltophilia NOT DETECTED NOT DETECTED Final   Candida albicans NOT DETECTED NOT DETECTED Final   Candida auris NOT DETECTED NOT DETECTED Final   Candida glabrata NOT DETECTED NOT DETECTED Final   Candida krusei NOT DETECTED NOT DETECTED Final   Candida parapsilosis NOT DETECTED NOT DETECTED Final   Candida tropicalis NOT DETECTED NOT DETECTED Final   Cryptococcus neoformans/gattii NOT DETECTED NOT DETECTED Final   Vancomycin resistance NOT DETECTED NOT DETECTED Final    Comment: Performed at [facility], [address] [place], [place] [address]  Blood culture (routine x 2)     Status: None   Collection Time: 08/28/20 10:19 PM   Specimen: BLOOD  Result Value Ref Range Status   Specimen Description   Final    BLOOD LEFT HAND Performed at [facility], [address] [place], [place] [address]    Special Requests   Final    BOTTLES DRAWN AEROBIC ONLY Blood Culture results may not be optimal due to an inadequate volume of blood received in culture bottles Performed at [facility], [address] [place], [place] [address]    Culture   Final    NO GROWTH 5 DAYS Performed at [facility], [address] [place], [place] [address]    Report Status 09/03/2020 FINAL  Final  Resp Panel by RT-PCR (Flu A&B, Covid) Nasopharyngeal Swab     Status: None   Collection Time: 08/28/20 11:38 PM   Specimen: Nasopharyngeal Swab; Nasopharyngeal(NP) swabs in vial transport medium  Result Value Ref Range Status   SARS Coronavirus 2 by RT PCR NEGATIVE NEGATIVE Final    Comment: (NOTE) SARS-CoV-2 target nucleic acids are NOT DETECTED.  The SARS-CoV-2 RNA is generally detectable in upper respiratory specimens during the acute phase of infection. The lowest concentration of SARS-CoV-2 viral copies this assay can detect is 138 copies/mL. A negative result does not preclude  SARS-Cov-2 infection and should not be used as the sole basis for treatment or other patient management decisions. A negative result may occur with  improper specimen collection/handling, submission of specimen other than nasopharyngeal swab, presence of viral mutation(s) within the  areas targeted by this assay, and inadequate number of viral copies(<138 copies/mL). A negative result must be combined with clinical observations, patient history, and epidemiological information. The expected result is Negative.  Fact Sheet for Patients:  BloggerCourse.com  Fact Sheet for Healthcare Providers:  SeriousBroker.it  This test is no t yet approved or cleared by the [place] FDA and  has been authorized for detection and/or diagnosis of SARS-CoV-2 by FDA under an Emergency Use Authorization (EUA). This EUA will remain  in effect (meaning this test can be used) for the duration of the COVID-19 declaration under Section 564(b)(1) of the Act, 21 U.S.C.section 360bbb-3(b)(1), unless the authorization is terminated  or revoked sooner.       Influenza A by PCR NEGATIVE NEGATIVE Final   Influenza B by PCR NEGATIVE NEGATIVE Final    Comment: (NOTE) The Xpert Xpress SARS-CoV-2/FLU/RSV plus assay is intended as an aid in the diagnosis of influenza from Nasopharyngeal swab specimens and should not be used as a sole basis for treatment. Nasal washings and aspirates are unacceptable for Xpert Xpress SARS-CoV-2/FLU/RSV testing.  Fact Sheet for Patients: BloggerCourse.com  Fact Sheet for Healthcare Providers: SeriousBroker.it  This test is not yet approved or cleared by the [place] FDA and has been authorized for detection and/or diagnosis of SARS-CoV-2 by FDA under an Emergency Use Authorization (EUA). This EUA will remain in effect (meaning this test can be used) for the duration of  the COVID-19 declaration under Section 564(b)(1) of the Act, 21 U.S.C. section 360bbb-3(b)(1), unless the authorization is terminated or revoked.  Performed at [facility], [address], [place], [place] [address]   Urine culture     Status: Abnormal   Collection Time: 08/29/20  2:45 AM   Specimen: Urine, Clean Catch  Result Value Ref Range Status   Specimen Description   Final    URINE, CLEAN CATCH Performed at [facility], [address], [place], [place] [address]    Special Requests   Final    NONE Performed at [facility], [address], [place], [place] [address]    Culture (A)  Final    <10,000 COLONIES/mL INSIGNIFICANT GROWTH Performed at [facility], [address], [place], [place] [address]    Report Status 08/30/2020 FINAL  Final  Culture, blood (routine x 2)     Status: None (Preliminary result)   Collection Time: 08/30/20 11:38 PM   Specimen: BLOOD  Result Value Ref Range Status   Specimen Description   Final    BLOOD BLOOD LEFT FOREARM Performed at [facility], [address], [place], [place] [address]    Special Requests   Final    BOTTLES DRAWN AEROBIC ONLY Blood Culture adequate volume Performed at [facility], [address], [place], [place] [address]    Culture   Final    NO GROWTH 3 DAYS Performed at [facility], [address], [place], [place] [address]    Report Status PENDING  Incomplete  Culture, blood (routine x 2)     Status: None (Preliminary result)   Collection Time: 08/30/20 11:38 PM   Specimen: BLOOD  Result Value Ref Range Status   Specimen Description   Final    BLOOD LEFT FINGER Performed at [facility], [address] [place], [place] [address]    Special Requests   Final    BOTTLES DRAWN AEROBIC ONLY Blood Culture adequate  volume Performed at [facility], [address] [place], [place] [address]    Culture   Final    NO GROWTH 3 DAYS Performed at [facility], [address] [place], [place] [address]    Report Status PENDING  Incomplete    Impression/Plan:  1. Port-a-cath line related bacteremia - on treatment with ampicillin and sulbactam due to the one culture with MSSA.  Continue with IV Unasyn while inpatient and at discharge can use oral Augmentin (linezolid not approved) through May 22nd.   TEE negative for vegetation.   2. HIV - on Triumeq and no concerns.  Continue follow up with his ID physician in [place].   3.  Access - from ID standpoint, ok to replace port a cath if needed after treatment completion.    I will sign off, call with questions

## 2020-09-04 ENCOUNTER — Other Ambulatory Visit: Payer: Self-pay | Admitting: Family Medicine

## 2020-09-04 DIAGNOSIS — D571 Sickle-cell disease without crisis: Secondary | ICD-10-CM

## 2020-09-04 DIAGNOSIS — G8929 Other chronic pain: Secondary | ICD-10-CM

## 2020-09-04 DIAGNOSIS — Z86711 Personal history of pulmonary embolism: Secondary | ICD-10-CM | POA: Diagnosis not present

## 2020-09-04 DIAGNOSIS — B20 Human immunodeficiency virus [HIV] disease: Secondary | ICD-10-CM | POA: Diagnosis not present

## 2020-09-04 DIAGNOSIS — D5700 Hb-SS disease with crisis, unspecified: Secondary | ICD-10-CM | POA: Diagnosis not present

## 2020-09-04 DIAGNOSIS — R7881 Bacteremia: Secondary | ICD-10-CM | POA: Diagnosis not present

## 2020-09-04 LAB — COMPREHENSIVE METABOLIC PANEL
ALT: 20 U/L (ref 0–44)
AST: 54 U/L — ABNORMAL HIGH (ref 15–41)
Albumin: 3.4 g/dL — ABNORMAL LOW (ref 3.5–5.0)
Alkaline Phosphatase: 57 U/L (ref 38–126)
Anion gap: 6 (ref 5–15)
BUN: 13 mg/dL (ref 6–20)
CO2: 28 mmol/L (ref 22–32)
Calcium: 8.8 mg/dL — ABNORMAL LOW (ref 8.9–10.3)
Chloride: 102 mmol/L (ref 98–111)
Creatinine, Ser: 0.8 mg/dL (ref 0.61–1.24)
GFR, Estimated: >60 mL/min (ref >60)
Glucose, Bld: 98 mg/dL (ref 70–99)
Potassium: 4.6 mmol/L (ref 3.5–5.1)
Sodium: 136 mmol/L (ref 135–145)
Total Bilirubin: 4.8 mg/dL — ABNORMAL HIGH (ref 0.3–1.2)
Total Protein: 8.5 g/dL — ABNORMAL HIGH (ref 6.5–8.1)

## 2020-09-04 LAB — CBC WITH DIFFERENTIAL/PLATELET
Abs Immature Granulocytes: 0.03 10*3/uL (ref 0.00–0.07)
Basophils Absolute: 0 10*3/uL (ref 0.0–0.1)
Basophils Relative: 0 %
Eosinophils Absolute: 0.1 10*3/uL (ref 0.0–0.5)
Eosinophils Relative: 1 %
HCT: 5.4 % — ABNORMAL LOW (ref 39.0–52.0)
Hemoglobin: 7.2 g/dL — ABNORMAL LOW (ref 13.0–17.0)
Immature Granulocytes: 0 %
Lymphocytes Relative: 71 %
Lymphs Abs: 8.2 10*3/uL — ABNORMAL HIGH (ref 0.7–4.0)
MCH: 126.3 pg — ABNORMAL HIGH (ref 26.0–34.0)
MCHC: 133.3 g/dL — ABNORMAL HIGH (ref 30.0–36.0)
MCV: 94.7 fL (ref 80.0–100.0)
Monocytes Absolute: 0.6 10*3/uL (ref 0.1–1.0)
Monocytes Relative: 6 %
Neutro Abs: 2.5 10*3/uL (ref 1.7–7.7)
Neutrophils Relative %: 22 %
Platelets: 313 10*3/uL (ref 150–400)
RBC: 0.57 MIL/uL — ABNORMAL LOW (ref 4.22–5.81)
RDW: 27.5 % — ABNORMAL HIGH (ref 11.5–15.5)
WBC: 11.4 10*3/uL — ABNORMAL HIGH (ref 4.0–10.5)
nRBC: 1 % — ABNORMAL HIGH (ref 0.0–0.2)

## 2020-09-04 MED ORDER — HEPARIN SOD (PORK) LOCK FLUSH 100 UNIT/ML IV SOLN: 500.0000 [IU] | INTRAVENOUS | Status: DC

## 2020-09-04 MED ORDER — HEPARIN SOD (PORK) LOCK FLUSH 100 UNIT/ML IV SOLN: 500.0000 [IU] | Freq: Once | INTRAVENOUS | Status: DC

## 2020-09-04 MED ORDER — OXYCODONE-ACETAMINOPHEN 10-325 MG PO TABS: 1.0000 | ORAL_TABLET | ORAL | 0 refills | Status: DC | PRN

## 2020-09-04 MED ORDER — AMOXICILLIN-POT CLAVULANATE 875-125 MG PO TABS: 1.0000 | ORAL_TABLET | Freq: Two times a day (BID) | ORAL | 0 refills | Status: AC

## 2020-09-04 MED ORDER — HEPARIN SOD (PORK) LOCK FLUSH 100 UNIT/ML IV SOLN: 500.0000 [IU] | INTRAVENOUS | Status: DC | PRN

## 2020-09-04 NOTE — Discharge Instructions (Signed)
Bacteremia, Adult Bacteremia is the presence of bacteria in the blood. When bacteria enter the bloodstream, they can cause a life-threatening reaction called sepsis. Sepsis is a medical emergency. What are the causes? This condition is caused by bacteria that get into the blood. Bacteria can enter the blood from an infection, including:  A skin infection or injury, such as a burn or a cut.  A lung infection (pneumonia).  An infection in the stomach or intestines.  An infection in the bladder or urinary system (urinary tract infection).  A bacterial infection in another part of the body that spreads to the blood. Bacteria can also enter the blood during a dental or medical procedure, from bleeding gums, or through use of an unclean needle. What increases the risk? This condition is more likely to develop in children, older adults, and people who have:  A long-term (chronic) disease or condition like diabetes or chronic kidney failure.  An artificial joint or heart valve, or heart valve disease.  A tube inserted to treat a medical condition, such as a urinary catheter or IV.  A weak disease-fighting system (immune system).  Injected illegal drugs.  Been hospitalized for more than 10 days in a row. What are the signs or symptoms? Symptoms of this condition include:  Fever and chills.  Fast heartbeat and shortness of breath.  Dizziness, weakness, and low blood pressure.  Confusion or anxiety.  Pain in the abdomen, nausea, vomiting, and diarrhea. Bacteremia that has spread to other parts of the body may cause symptoms in those areas. In some cases, there are no symptoms. How is this diagnosed? This condition may be diagnosed with a physical exam and tests, such as:  Blood tests to check for bacteria (cultures) or other signs of infection.  Tests of any tubes that you have had inserted. These tests check for a source of infection.  Urine tests to check for bacteria in the  urine.  Imaging tests, such as an X-ray, a CT scan, an MRI, or a heart ultrasound. These check for a source of infection in other parts of your body, such as your lungs, heart valves, or joints.   How is this treated? This condition is usually treated in the hospital. If you are treated at home, you may need to return to the hospital for medicines, blood tests, and evaluation. Treatment may include:  Antibiotic medicines. These may be given by mouth or directly into your blood through an IV. You may need antibiotics for several weeks. At first, you may be given an antibiotic to kill most types of blood bacteria. If tests show that a certain kind of bacteria is causing the problem, you may be given a different antibiotic.  IV fluids.  Removing any catheter or device that could be a source of infection.  Blood pressure and breathing support, if needed.  Surgery to control the source or the spread of infection, such as surgery to remove an implanted device, abscess, or infected tissue. Follow these instructions at home: Medicines  Take over-the-counter and prescription medicines only as told by your health care provider.  If you were prescribed an antibiotic medicine, take it as told by your health care provider. Do not stop taking the antibiotic even if you start to feel better. General instructions  Rest as needed. Ask your health care provider when you may return to normal activities.  Drink enough fluid to keep your urine pale yellow.  Do not use any products that contain nicotine  or tobacco, such as cigarettes, e-cigarettes, and chewing tobacco. If you need help quitting, ask your health care provider.  Keep all follow-up visits as told by your health care provider. This is important.   How is this prevented?  Wash your hands regularly with soap and water. If soap and water are not available, use hand sanitizer.  You should wash your hands: ? After using the toilet or changing a  diaper. ? Before preparing, cooking, serving, or eating food. ? While caring for a sick person or while visiting someone in a hospital. ? Before and after changing bandages (dressings) over wounds.  Clean any scrapes or cuts with soap and water and cover them with a clean bandage.  Get vaccinations as recommended by your health care provider.  Practice good oral hygiene. Brush your teeth two times a day, and floss regularly.  Take good care of your skin. This includes bathing and moisturizing on a regular basis.   Contact a health care provider if:  Your symptoms get worse, and medicines do not help.  You have severe pain. Get help right away if you have:  Pain.  A fever or chills.  Trouble breathing.  A fast heart rate.  Skin that is blotchy, pale, or clammy.  Confusion.  Weakness.  Lack of energy or unusual sleepiness.  New symptoms that develop after treatment has started. These symptoms may represent a serious problem that is an emergency. Do not wait to see if the symptoms will go away. Get medical help right away. Call your local emergency services (911 in the [place] Do not drive yourself to the hospital. Summary  Bacteremia is the presence of bacteria in the blood. When bacteria enter the bloodstream, they can cause a life-threatening reaction called sepsis.  Bacteremia is usually treated with antibiotic medicines in the hospital.  If you were prescribed an antibiotic medicine, take it as told by your health care provider. Do not stop taking the antibiotic even if you start to feel better.  Get help right away if you have any new symptoms that develop after treatment has started. This information is not intended to replace advice given to you by your health care provider. Make sure you discuss any questions you have with your health care provider. Document Revised: 08/26/2018 Document Reviewed: 08/26/2018 Else[employer] Patient Education  2021 [employer]   Sickle Cell Anemia, Adult  Sickle cell anemia is a condition in which red blood cells have an abnormal "sickle" shape. Red blood cells carry oxygen through the body. Sickle-shaped red blood cells do not live as long as normal red blood cells. They also clump together and block blood from flowing through the blood vessels. This condition prevents the body from getting enough oxygen. Sickle cell anemia causes organ damage and pain. It also increases the risk of infection. What are the causes? This condition is caused by a gene that is passed from parent to child (inherited). Receiving two copies of the gene causes the disease. Receiving one copy causes the "trait," which means that symptoms are milder or not present. What increases the risk? This condition is more likely to develop if your ancestors were from [place], [place], [place], the [place], [place], or the [place]. What are the signs or symptoms? Symptoms of this condition include:  Episodes of pain (crises), especially in the hands and feet, joints, back, chest, or abdomen. The pain can be triggered by: ? An illness, especially if there is  dehydration. ? Doing an activity with great effort (overexertion). ? Exposure to extreme temperature changes. ? High altitude.  Fatigue.  Shortness of breath or difficulty breathing.  Dizziness.  Pale skin or yellowed skin (jaundice).  Frequent bacterial infections.  Pain and swelling in the hands and feet (hand-food syndrome).  Prolonged, painful erection of the penis (priapism).  Acute chest syndrome. Symptoms of this include: ? Chest pain. ? Fever. ? Cough. ? Fast breathing.  Stroke.  Decreased activity.  Loss of appetite.  Change in behavior.  Headaches.  Seizures.  Vision changes.  Skin ulcers.  Heart disease.  High blood pressure.  Gallstones.  Liver and kidney problems. How is this diagnosed? This condition is  diagnosed with blood tests that check for the gene that causes this condition. How is this treated? There is no cure for most cases of this condition. Treatment focuses on managing your symptoms and preventing complications of the disease. Your health care provider will work with you to identify the best treatment options for you based on an assessment of your condition. Treatment may include:  Medicines, including: ? Pain medicines. ? Antibiotic medicines for infection. ? Medicines to increase the production of a protein in red blood cells that helps carry oxygen in the body (hemoglobin).  Fluids to treat pain and swelling.  Oxygen to treat acute chest syndrome.  Blood transfusions to treat symptoms such as fatigue, stroke, and acute chest syndrome.  Massage and physical therapy for pain.  Regular tests to monitor your condition, such as blood tests, X-rays, CT scans, MRI scans, ultrasounds, and lung function tests. These should be done every 3-12 months, depending on your age.  Hematopoietic stem cell transplant. This is a procedure to replace abnormal stem cells with healthy stem cells from a donor's bone marrow. Stem cells are cells that can develop into blood cells, and bone marrow is the spongy tissue inside the bones. Follow these instructions at home: Medicines  Take over-the-counter and prescription medicines only as told by your health care provider.  If you were prescribed an antibiotic medicine, take it as told by your health care provider. Do not stop taking the antibiotic even if you start to feel better.  If you develop a fever, do not take medicines to reduce the fever right away. This could cover up another problem. Notify your health care provider. Managing pain, stiffness, and swelling  Try these methods to help ease your pain: ? Using a heating pad. ? Taking a warm bath. ? Distracting yourself, such as by watching TV. Eating and drinking  Drink enough fluid to  keep your urine clear or pale yellow. Drink more in hot weather and during exercise.  Limit or avoid drinking alcohol.  Eat a balanced and nutritious diet. Eat plenty of fruits, vegetables, whole grains, and lean protein.  Take vitamins and supplements as directed by your health care provider. Traveling  When traveling, keep these with you: ? Your medical information. ? The names of your health care providers. ? Your medicines.  If you have to travel by air, ask about precautions you should take. Activity  Get plenty of rest.  Avoid activities that will lower your oxygen levels, such as exercising vigorously. General instructions  Do not use any products that contain nicotine or tobacco, such as cigarettes and e-cigarettes. They lower blood oxygen levels. If you need help quitting, ask your health care provider.  Consider wearing a medical alert bracelet.  Avoid high altitudes.  Avoid extreme  temperatures and extreme temperature changes.  Keep all follow-up visits as told by your health care provider. This is important. Contact a health care provider if:  You develop joint pain.  Your feet or hands swell or have pain.  You have fatigue. Get help right away if:  You have symptoms of infection. These include: ? Fever. ? Chills. ? Extreme tiredness. ? Irritability. ? Poor eating. ? Vomiting.  You feel dizzy or faint.  You have new abdominal pain, especially on the left side near the stomach area.  You develop priapism.  You have numbness in your arms or legs or have trouble moving them.  You have trouble talking.  You develop pain that cannot be controlled with medicine.  You become short of breath.  You have rapid breathing.  You have a persistent cough.  You have pain in your chest.  You develop a severe headache or stiff neck.  You feel bloated without eating or after eating a small amount of food.  Your skin is pale.  You suddenly lose  vision. Summary  Sickle cell anemia is a condition in which red blood cells have an abnormal "sickle" shape. This disease can cause organ damage and chronic pain, and it can raise your risk of infection.  Sickle cell anemia is a genetic disorder.  Treatment focuses on managing your symptoms and preventing complications of the disease.  Get medical help right away if you have any signs of infection, such as a fever. This information is not intended to replace advice given to you by your health care provider. Make sure you discuss any questions you have with your health care provider. Document Revised: 08/31/2019 Document Reviewed: 08/31/2019 Elsevier Patient Education  2021 [employer].

## 2020-09-04 NOTE — Discharge Summary (Signed)
Physician Discharge Summary  HERSH MINNEY DPO:[id number] DOB: 1990/09/24 DOA: 08/28/2020  PCP: Massie Maroon, [name]  Admit date: 08/28/2020  Discharge date: 09/04/2020  Discharge Diagnoses:  Principal Problem:   Bacteremia due to Enterococcus Active Problems:   HIV (human immunodeficiency virus infection) (HCC)   History of pulmonary embolus (PE)   Sickle cell anemia with pain [facility])   Discharge Condition: Stable  Disposition:   Follow-up Information    Massie Maroon, [name] Follow up in 2 week(s).   Specialty: Family Medicine Contact information: [address] [place] [address] [phone]              Pt is discharged home in good condition and is to follow up with Massie Maroon, [name] this week to have labs evaluated. Maciah X Depascale is instructed to increase activity slowly and balance with rest for the next few days, and use prescribed medication to complete treatment of pain  Diet: Regular Wt Readings from Last 3 Encounters:  09/03/20 69.4 kg  08/26/20 70.1 kg  06/10/20 65.8 kg    History of present illness:  Patryk Rebstock is a 30 year old male with a medical history significant for sickle cell disease, HIV, chronic pain syndrome, opiate dependence, generalized anxiety, and pulmonary embolism that presented to the emergency department.  Patient has prior Port-A-Cath infection requiring removal and replacement in the past.  Prior MRSA infection per patient, PE, supposed to be taking Eliquis but admits to missing doses.  Patient with an admission on 08/26/2020 for possible acute chest syndrome.  Patient received 1 dose of Rocephin and azithromycin and had to leave abruptly due to family issues. Following hospital discharge, patient was called back by hospital after blood cultures done on 08/26/2020 came back 4/4 bottles positive for Enterococcus and (1 or 2, not sure) positive for Staph aureus.  Staph aureus was in the Port-A-Cath culture  and Enterococcus was in both port and wrist cultures.  S. aureus was not detected on the [facility] ID. Patient's susceptibilities are pending.  Patient also continues to have low back pain and pain in lower extremities which is consistent with prior episodes of sickle cell pain crisis.  Pain is not worse or different from prior episodes.  He denies any fever, chills, dysuria, hematuria, shortness of breath.  Patient on chronic oxygen at 2 L at home.  Emergency room course: WBCs 12,000.  Hospitalist requested to admit for bacteremia Hospital Course:   Enterococcus bacteremia: Blood cultures yielded Enterococcus faecalis 4/4.  Patient was admitted for bacteremia in the setting of sickle cell pain crisis.  Infectious disease consulted.  Patient received IV Unasyn for a total of 5 days.  Recommended that patient complete Augmentin for an additional 7 days.  No additional infectious disease follow-up warranted.  Patient was afebrile at discharge and WBC is trending down.  Augmentin 875-125 mg every 12 hours #14 was sent to patient's pharmacy.  Sickle cell disease with pain crisis: Patient was admitted for sickle cell pain crisis and managed appropriately with IVF, IV Dilaudid via PCA as well as other adjunct therapies per sickle cell pain management protocols.  Patient's pain was slow to respond to treatment regimen.  IV Dilaudid PCA was weaned appropriately and patient was transition to his home medication which is Percocet 10-325 mg every 4 hours as needed for severe breakthrough pain.  Patient requested prescription prior to discharge, Percocet 10-325 milligrams every 12 hours #90 was sent to patient's pharmacy.  PDMP reviewed  prior to prescribing opiate medications, no inconsistencies were noted.  Patient will follow-up with sickle cell clinic in 1 week to repeat CBC with differential and complete metabolic panel.  Prior to discharge, patient's pain intensity is 5/10.  He feels that he can manage at home on  current medication regimen.  History of HIV infection: Patient has a long history of HIV.  He is followed by infectious disease [place].  He states that he follows up around every 3 months.  Patient has been consistent with HAART therapy.  Advised to follow-up with ID as scheduled.  History of pulmonary embolism: Patient has a history of pulmonary embolism.  Eliquis was continued throughout admission.  Discussed the importance of taking Eliquis consistently in order to achieve positive outcomes.  Patient expressed understanding.  Patient is afebrile, oxygen saturation 98% on RA, alert, oriented, and ambulating without assistance.  Patient is aware of all upcoming appointments. Patient was therefore discharged home today in a hemodynamically stable condition.   Festus will follow-up with PCP within 1 week of this discharge. Denise was counseled extensively about nonpharmacologic means of pain management, patient verbalized understanding and was appreciative of  the care received during this admission.   We discussed the need for good hydration, monitoring of hydration status, avoidance of heat, cold, stress, and infection triggers. We discussed the need to be adherent with taking Hydrea and other home medications. Patient was reminded of the need to seek medical attention immediately if any symptom of bleeding, anemia, or infection occurs.  Discharge Exam: Vitals:   09/04/20 1410 09/04/20 1728  BP: 111/70   Pulse: 86   Resp: 13 12  Temp: 98.3 F (36.8 C)   SpO2: 92% 92%   Vitals:   09/04/20 1000 09/04/20 1257 09/04/20 1410 09/04/20 1728  BP: 106/64  111/70   Pulse: 71  86   Resp: Temp: 98.2 F (36.8 C)  98.3 F (36.8 C)   TempSrc: Oral  Oral   SpO2: 100% 92% 92% 92%  Weight:      Height:        General appearance : Awake, alert, not in any distress. Speech Clear. Not toxic looking HEENT: Atraumatic and Normocephalic, pupils equally reactive to light and  accomodation Neck: Supple, no JVD. No cervical lymphadenopathy.  Chest: Good air entry bilaterally, no added sounds  CVS: S1 S2 regular, no murmurs.  Abdomen: Bowel sounds present, Non tender and not distended with no guarding, rigidity or rebound. Extremities: B/L Lower Ext shows no edema, both legs are warm to touch Neurology: Awake alert, and oriented X 3, CN II-XII intact, Non focal Skin: No Rash  Discharge Instructions   Allergies as of 09/04/2020      Reactions   Ketorolac Tromethamine Swelling, Other (See Comments)   Patient reports facial edema and left arm edema after administration.   Tape Rash, Other (See Comments)   PLEASE DO NOT USE THE CLEAR, THICK, "PLASTIC" TAPE- only paper tape is tolerated       Medication List    TAKE these medications   ALPRAZolam 1 MG tablet Commonly known as: XANAX Take 1 mg by mouth 3 (three) times daily as needed for anxiety.   amoxicillin-clavulanate 875-125 MG tablet Commonly known as: Augmentin Take 1 tablet by mouth 2 (two) times daily for 4 days.   apixaban 5 MG Tabs tablet Commonly known as: Eliquis Take 1 tablet (5 mg total) by mouth 2 (two) times daily.   cetirizine  10 MG tablet Commonly known as: ZyrTEC Allergy Take 1 tablet (10 mg total) by mouth daily. What changed:   when to take this  reasons to take this   hydroxyurea 500 MG capsule Commonly known as: HYDREA Take 4 capsules (2,000 mg total) by mouth at bedtime.   multivitamin with minerals Tabs tablet Take 1 tablet by mouth daily with breakfast.   Triumeq 600-50-300 MG tablet Generic drug: abacavir-dolutegravir-lamiVUDine Take 1 tablet by mouth at bedtime.   Vitamin D (Ergocalciferol) 1.25 MG (50000 UNIT) Caps capsule Commonly known as: DRISDOL Take 1 capsule (50,000 Units total) by mouth every 7 (seven) days. What changed: when to take this     ASK your doctor about these medications   oxyCODONE-acetaminophen 10-325 MG tablet Commonly known as:  PERCOCET Take 1 tablet by mouth every 4 (four) hours as needed for up to 15 days for pain. Ask about: Which instructions should I use?       The results of significant diagnostics from this hospitalization (including imaging, microbiology, ancillary and laboratory) are listed below for reference.    Significant Diagnostic Studies: DG Chest 2 View  Result Date: 08/28/2020 CLINICAL DATA:  Shortness of breath, sickle cell disease EXAM: CHEST - 2 VIEW COMPARISON:  08/26/2020 FINDINGS: Cardiac shadow is stable. Right chest wall port is again seen and stable. Previously seen patchy airspace opacities are again identified and stable. No new focal abnormality is noted. No bony abnormality is seen. IMPRESSION: Patchy bibasilar airspace opacities stable from the prior exam. Electronically Signed   By: Alcide Clever M.D.   On: 08/28/2020 22:40   DG Chest 2 View  Result Date: 08/26/2020 CLINICAL DATA:  30 year old male with sickle cell disease and pain crisis. EXAM: CHEST - 2 VIEW COMPARISON:  Portable chest 05/16/2020 and earlier. FINDINGS: Semi upright AP and lateral views of the chest. Stable right chest power port. Lung volumes are lower than baseline. Stable mild cardiomegaly. Other mediastinal contours are within normal limits. Visualized tracheal air column is within normal limits. No pneumothorax, pulmonary edema or pleural effusion. However, there is patchy and indistinct bilateral lower lung opacity on both views. Visible spinal vertebrae appear intact and relatively normal. Cholecystectomy clips in the abdomen. Negative visible bowel gas pattern. IMPRESSION: Increased patchy and indistinct bilateral lower lung opacity suspicious for acute chest syndrome in this setting. No pleural effusion. Electronically Signed   By: Odessa Fleming M.D.   On: 08/26/2020 06:22   IR REMOVAL TUN ACCESS W/ PORT W/O FL MOD SED  Result Date: 08/30/2020 CLINICAL DATA:  Bacteremia and indwelling right chest port original placed  on 08/29/2019. Request has been made to remove the port due to persistent bacteremia. EXAM: REMOVAL OF IMPLANTED TUNNELED PORT-A-CATH MEDICATIONS: Moderate (conscious) sedation was employed during this procedure. A total of Versed 4.0 mg and Fentanyl 200 mcg was administered intravenously. Moderate Sedation Time: 27 minutes. The patient's level of consciousness and vital signs were monitored continuously by radiology nursing throughout the procedure under my direct supervision. PROCEDURE: The right chest Port-A-Cath site was prepped with chlorhexidine. A sterile gown and gloves were worn during the procedure. Local anesthesia was provided with 1% lidocaine. An incision was made overlying the Port-A-Cath with a #15 scalpel. Utilizing sharp and blunt dissection, the Port-A-Cath was removed. Portable cautery was utilized. The pocket was irrigated with sterile saline. Wound closure was performed with subcutaneous 3-0 Monocryl, subcuticular 4-0 Vicryl and Dermabond. The entire Port-A-Cath was removed successfully. IMPRESSION: Removal of implanted Port-A-Cath utilizing sharp  and blunt dissection. The procedure was uncomplicated. Electronically Signed   By: Irish Lack M.D.   On: 08/30/2020 17:20   ECHO TEE  Result Date: 08/30/2020    TRANSESOPHOGEAL ECHO REPORT   Patient Name:   RONITH BERTI Date of Exam: 08/30/2020 Medical Rec #:  [id number]           Height:       75.0 in Accession #:    [id number]          Weight:       150.0 lb Date of Birth:  1990-09-15           BSA:          1.942 m Patient Age:    29 years            BP:           115/81 mmHg Patient Gender: M                   HR:           101 bpm. Exam Location:  Inpatient Procedure: Transesophageal Echo, Color Doppler and Cardiac Doppler Indications:     Bacteremia  History:         Patient has no prior history of Echocardiogram examinations.  Sonographer:     Eulah Pont RDCS Referring Phys:  [id number] JILL D MCDANIEL Diagnosing Phys: Zoila Shutter MD PROCEDURE: After discussion of the risks and benefits of a TEE, an informed consent was obtained from the patient. The transesophogeal probe was passed without difficulty through the esophogus of the patient. Sedation performed by different physician. The patient was monitored while under deep sedation. Anesthestetic sedation was provided intravenously by Anesthesiology: 186mg  of Propofol. The patient's vital signs; including heart rate, blood pressure, and oxygen saturation; remained stable throughout the procedure. The patient developed no complications during the procedure. IMPRESSIONS  1. Left ventricular ejection fraction, by estimation, is 60 to 65%. The left ventricle has normal function.  2. Right ventricular systolic function is normal. The right ventricular size is normal.  3. No left atrial/left atrial appendage thrombus was detected.  4. Right atrial size was mildly dilated. Catheter noted in the right atrium without vegetation.  5. The mitral valve is grossly normal. Trivial mitral valve regurgitation.  6. The aortic valve is tricuspid. Aortic valve regurgitation is not visualized. Conclusion(s)/Recommendation(s): No evidence of vegetation/infective endocarditis on this transesophageal echocardiogram. FINDINGS  Left Ventricle: Left ventricular ejection fraction, by estimation, is 60 to 65%. The left ventricle has normal function. The left ventricular internal cavity size was normal in size. There is no left ventricular hypertrophy. Right Ventricle: The right ventricular size is normal. No increase in right ventricular wall thickness. Right ventricular systolic function is normal. Left Atrium: Left atrial size was normal in size. No left atrial/left atrial appendage thrombus was detected. Right Atrium: Right atrial size was mildly dilated. Catheter noted in the right atrium without vegetation. Pericardium: There is no evidence of pericardial effusion. Mitral Valve: The mitral valve is grossly  normal. Trivial mitral valve regurgitation. Tricuspid Valve: The tricuspid valve is grossly normal. Tricuspid valve regurgitation is trivial. Aortic Valve: The aortic valve is tricuspid. Aortic valve regurgitation is not visualized. Pulmonic Valve: The pulmonic valve was grossly normal. Pulmonic valve regurgitation is trivial. Aorta: The aortic root and ascending aorta are structurally normal, with no evidence of dilitation. IAS/Shunts: No atrial level shunt detected by color flow Doppler. MD Electronically signed  by Zoila Shutter MD Signature Date/Time: 08/30/2020/3:01:04 PM    Final     Microbiology: Recent Results (from the past 240 hour(s))  Culture, blood (routine x 2)     Status: Abnormal   Collection Time: 08/26/20  6:38 AM   Specimen: BLOOD  Result Value Ref Range Status   Specimen Description   Final    BLOOD PORTA CATH Performed at [facility], [address], [place], [place] [address]    Special Requests   Final    BOTTLES DRAWN AEROBIC AND ANAEROBIC Blood Culture adequate volume Performed at [facility], [address], [place], [place] [address]    Culture  Setup Time   Final    GRAM POSITIVE COCCI IN CHAINS IN BOTH AEROBIC AND ANAEROBIC BOTTLES CRITICAL RESULT CALLED TO, READ BACK BY AND VERIFIED WITH: DR Hart [place] [id number] FCP    Culture (A)  Final    ENTEROCOCCUS FAECALIS STAPHYLOCOCCUS AUREUS CRITICAL RESULT CALLED TO, READ BACK BY AND VERIFIED WITH: RN K.FREEMAN AT 1157 ON 08/27/2020 BY T.SAAD Performed at [facility], [address], [place], [place] [address]    Report Status 08/30/2020 FINAL  Final   Organism ID, Bacteria ENTEROCOCCUS FAECALIS  Final   Organism ID, Bacteria STAPHYLOCOCCUS AUREUS  Final      Susceptibility   Enterococcus faecalis - MIC*    AMPICILLIN <=2 SENSITIVE Sensitive     VANCOMYCIN 1 SENSITIVE Sensitive     GENTAMICIN SYNERGY SENSITIVE Sensitive     * ENTEROCOCCUS FAECALIS    Staphylococcus aureus - MIC*    CIPROFLOXACIN <=0.5 SENSITIVE Sensitive     ERYTHROMYCIN >=8 RESISTANT Resistant     GENTAMICIN <=0.5 SENSITIVE Sensitive     OXACILLIN <=0.25 SENSITIVE Sensitive     TETRACYCLINE <=1 SENSITIVE Sensitive     VANCOMYCIN 1 SENSITIVE Sensitive     TRIMETH/SULFA <=10 SENSITIVE Sensitive     CLINDAMYCIN <=0.25 SENSITIVE Sensitive     RIFAMPIN <=0.5 SENSITIVE Sensitive     Inducible Clindamycin NEGATIVE Sensitive     * STAPHYLOCOCCUS AUREUS  Blood Culture ID Panel (Reflexed)     Status: Abnormal   Collection Time: 08/26/20  6:38 AM  Result Value Ref Range Status   Enterococcus faecalis DETECTED (A) NOT DETECTED Final    Comment: CRITICAL RESULT CALLED TO, READ BACK BY AND VERIFIED WITH: DR Laquasia Pincus [id number] [id number] FCP    Enterococcus Faecium NOT DETECTED NOT DETECTED Final   Listeria monocytogenes NOT DETECTED NOT DETECTED Final   Staphylococcus species NOT DETECTED NOT DETECTED Final   Staphylococcus aureus (BCID) NOT DETECTED NOT DETECTED Final   Staphylococcus epidermidis NOT DETECTED NOT DETECTED Final   Staphylococcus lugdunensis NOT DETECTED NOT DETECTED Final   Streptococcus species NOT DETECTED NOT DETECTED Final   Streptococcus agalactiae NOT DETECTED NOT DETECTED Final   Streptococcus pneumoniae NOT DETECTED NOT DETECTED Final   Streptococcus pyogenes NOT DETECTED NOT DETECTED Final   A.calcoaceticus-baumannii NOT DETECTED NOT DETECTED Final   Bacteroides fragilis NOT DETECTED NOT DETECTED Final   Enterobacterales NOT DETECTED NOT DETECTED Final   Enterobacter cloacae complex NOT DETECTED NOT DETECTED Final   Escherichia coli NOT DETECTED NOT DETECTED Final   Klebsiella aerogenes NOT DETECTED NOT DETECTED Final   Klebsiella oxytoca NOT DETECTED NOT DETECTED Final   Klebsiella pneumoniae NOT DETECTED NOT DETECTED Final   Proteus species NOT DETECTED NOT DETECTED Final   Salmonella species NOT DETECTED NOT DETECTED Final   Serratia marcescens NOT  DETECTED NOT DETECTED Final   Haemophilus influenzae NOT DETECTED NOT DETECTED Final   Neisseria meningitidis NOT DETECTED NOT DETECTED Final   Pseudomonas aeruginosa NOT DETECTED NOT DETECTED Final   Stenotrophomonas maltophilia NOT DETECTED NOT DETECTED Final   Candida albicans NOT DETECTED NOT DETECTED Final   Candida auris NOT DETECTED NOT DETECTED Final   Candida glabrata NOT DETECTED NOT DETECTED Final   Candida krusei NOT DETECTED NOT DETECTED Final   Candida parapsilosis NOT DETECTED NOT DETECTED Final   Candida tropicalis NOT DETECTED NOT DETECTED Final   Cryptococcus neoformans/gattii NOT DETECTED NOT DETECTED Final   Meth resistant mecA/C and MREJ NOT DETECTED NOT DETECTED Final   Vancomycin resistance NOT DETECTED NOT DETECTED Final    Comment: Performed at [facility], [address] [place], [place] [address]  Resp Panel by RT-PCR (Flu A&B, Covid) Nasopharyngeal Swab     Status: None   Collection Time: 08/26/20  6:39 AM   Specimen: Nasopharyngeal Swab; Nasopharyngeal(NP) swabs in vial transport medium  Result Value Ref Range Status   SARS Coronavirus 2 by RT PCR NEGATIVE NEGATIVE Final    Comment: (NOTE) SARS-CoV-2 target nucleic acids are NOT DETECTED.  The SARS-CoV-2 RNA is generally detectable in upper respiratory specimens during the acute phase of infection. The lowest concentration of SARS-CoV-2 viral copies this assay can detect is 138 copies/mL. A negative result does not preclude SARS-Cov-2 infection and should not be used as the sole basis for treatment or other patient management decisions. A negative result may occur with  improper specimen collection/handling, submission of specimen other than nasopharyngeal swab, presence of viral mutation(s) within the areas targeted by this assay, and inadequate number of viral copies(<138 copies/mL). A negative result must be combined with clinical observations, patient history, and epidemiological information.  The expected result is Negative.  Fact Sheet for Patients:  BloggerCourse.comhttps://www.fda.gov/media/152166/download  Fact Sheet for Healthcare Providers:  SeriousBroker.ithttps://www.fda.gov/media/152162/download  This test is no t yet approved or cleared by the [place] FDA and  has been authorized for detection and/or diagnosis of SARS-CoV-2 by FDA under an Emergency Use Authorization (EUA). This EUA will remain  in effect (meaning this test can be used) for the duration of the COVID-19 declaration under Section 564(b)(1) of the Act, 21 U.S.C.section 360bbb-3(b)(1), unless the authorization is terminated  or revoked sooner.       Influenza A by PCR NEGATIVE NEGATIVE Final   Influenza B by PCR NEGATIVE NEGATIVE Final    Comment: (NOTE) The Xpert Xpress SARS-CoV-2/FLU/RSV plus assay is intended as an aid in the diagnosis of influenza from Nasopharyngeal swab specimens and should not be used as a sole basis for treatment. Nasal washings and aspirates are unacceptable for Xpert Xpress SARS-CoV-2/FLU/RSV testing.  Fact Sheet for Patients: BloggerCourse.comhttps://www.fda.gov/media/152166/download  Fact Sheet for Healthcare Providers: SeriousBroker.ithttps://www.fda.gov/media/152162/download  This test is not yet approved or cleared by the [place] FDA and has been authorized for detection and/or diagnosis of SARS-CoV-2 by FDA under an Emergency Use Authorization (EUA). This EUA will remain in effect (meaning this test can be used) for the duration of the COVID-19 declaration under Section 564(b)(1) of the Act, 21 U.S.C. section 360bbb-3(b)(1), unless the authorization is terminated or revoked.  Performed at [facility], [address] [place], [place] [address]   Culture, blood (routine x 2)     Status: Abnormal   Collection Time: 08/26/20  6:43 AM   Specimen: BLOOD RIGHT HAND  Result Value Ref Range Status  Specimen Description   Final    BLOOD RIGHT HAND Performed at [facility],  [address], [place], [place] [address]    Special Requests   Final    BOTTLES DRAWN AEROBIC AND ANAEROBIC Blood Culture results may not be optimal due to an inadequate volume of blood received in culture bottles Performed at [facility], [address] [place], [place] [address]    Culture  Setup Time   Final    GRAM POSITIVE COCCI IN CHAINS IN BOTH AEROBIC AND ANAEROBIC BOTTLES    Culture (A)  Final    ENTEROCOCCUS FAECALIS SUSCEPTIBILITIES PERFORMED ON PREVIOUS CULTURE WITHIN THE LAST 5 DAYS. Performed at [facility], [address], [place], [place] [address]    Report Status 08/29/2020 FINAL  Final  Blood culture (routine x 2)     Status: Abnormal   Collection Time: 08/28/20 10:14 PM   Specimen: BLOOD  Result Value Ref Range Status   Specimen Description   Final    BLOOD RIGHT HAND Performed at [facility], [address] [place], [place] [address]    Special Requests   Final    BOTTLES DRAWN AEROBIC AND ANAEROBIC Blood Culture adequate volume Performed at [facility], [address], [place], [place] [address]    Culture  Setup Time   Final    GRAM POSITIVE COCCI IN CHAINS AEROBIC BOTTLE ONLY Organism ID to follow CRITICAL RESULT CALLED TO, READ BACK BY AND VERIFIED WITH: PHARMD Sheppard Penton POINDEXTER BY MESSAN H. AT 0348 ON 08/30/2020 Performed at [facility], [address] [place], [place] [address]    Culture ENTEROCOCCUS FAECALIS (A)  Final   Report Status 09/01/2020 FINAL  Final   Organism ID, Bacteria ENTEROCOCCUS FAECALIS  Final      Susceptibility   Enterococcus faecalis - MIC*    AMPICILLIN <=2 SENSITIVE Sensitive     VANCOMYCIN 1 SENSITIVE Sensitive     GENTAMICIN SYNERGY SENSITIVE Sensitive     * ENTEROCOCCUS FAECALIS  Blood Culture ID Panel (Reflexed)     Status: Abnormal   Collection Time: 08/28/20 10:14 PM  Result Value Ref Range Status   Enterococcus faecalis DETECTED (A)  NOT DETECTED Final    Comment: CRITICAL RESULT CALLED TO, READ BACK BY AND VERIFIED WITH: PHARMD Sheppard Penton [facility] AT 5784 ON 08/30/2020    Enterococcus Faecium NOT DETECTED NOT DETECTED Final   Listeria monocytogenes NOT DETECTED NOT DETECTED Final   Staphylococcus species NOT DETECTED NOT DETECTED Final   Staphylococcus aureus (BCID) NOT DETECTED NOT DETECTED Final   Staphylococcus epidermidis NOT DETECTED NOT DETECTED Final   Staphylococcus lugdunensis NOT DETECTED NOT DETECTED Final   Streptococcus species NOT DETECTED NOT DETECTED Final   Streptococcus agalactiae NOT DETECTED NOT DETECTED Final   Streptococcus pneumoniae NOT DETECTED NOT DETECTED Final   Streptococcus pyogenes NOT DETECTED NOT DETECTED Final   A.calcoaceticus-baumannii NOT DETECTED NOT DETECTED Final   Bacteroides fragilis NOT DETECTED NOT DETECTED Final   Enterobacterales NOT DETECTED NOT DETECTED Final   Enterobacter cloacae complex NOT DETECTED NOT DETECTED Final   Escherichia coli NOT DETECTED NOT DETECTED Final   Klebsiella aerogenes NOT DETECTED NOT DETECTED Final   Klebsiella oxytoca NOT DETECTED NOT DETECTED Final   Klebsiella pneumoniae NOT DETECTED NOT DETECTED Final   Proteus species NOT DETECTED NOT DETECTED Final   Salmonella species NOT DETECTED NOT DETECTED Final   Serratia marcescens NOT  DETECTED NOT DETECTED Final   Haemophilus influenzae NOT DETECTED NOT DETECTED Final   Neisseria meningitidis NOT DETECTED NOT DETECTED Final   Pseudomonas aeruginosa NOT DETECTED NOT DETECTED Final   Stenotrophomonas maltophilia NOT DETECTED NOT DETECTED Final   Candida albicans NOT DETECTED NOT DETECTED Final   Candida auris NOT DETECTED NOT DETECTED Final   Candida glabrata NOT DETECTED NOT DETECTED Final   Candida krusei NOT DETECTED NOT DETECTED Final   Candida parapsilosis NOT DETECTED NOT DETECTED Final   Candida tropicalis NOT DETECTED NOT DETECTED Final   Cryptococcus neoformans/gattii NOT  DETECTED NOT DETECTED Final   Vancomycin resistance NOT DETECTED NOT DETECTED Final    Comment: Performed at [facility], [address], [place], [place] [address]  Blood culture (routine x 2)     Status: None   Collection Time: 08/28/20 10:19 PM   Specimen: BLOOD  Result Value Ref Range Status   Specimen Description   Final    BLOOD LEFT HAND Performed at [facility], [address], [place], [place] [address]    Special Requests   Final    BOTTLES DRAWN AEROBIC ONLY Blood Culture results may not be optimal due to an inadequate volume of blood received in culture bottles Performed at [facility], [address] [place], [place] [address]    Culture   Final    NO GROWTH 5 DAYS Performed at [facility], [address], [place], [place] [address]    Report Status 09/03/2020 FINAL  Final  Resp Panel by RT-PCR (Flu A&B, Covid) Nasopharyngeal Swab     Status: None   Collection Time: 08/28/20 11:38 PM   Specimen: Nasopharyngeal Swab; Nasopharyngeal(NP) swabs in vial transport medium  Result Value Ref Range Status   SARS Coronavirus 2 by RT PCR NEGATIVE NEGATIVE Final    Comment: (NOTE) SARS-CoV-2 target nucleic acids are NOT DETECTED.  The SARS-CoV-2 RNA is generally detectable in upper respiratory specimens during the acute phase of infection. The lowest concentration of SARS-CoV-2 viral copies this assay can detect is 138 copies/mL. A negative result does not preclude SARS-Cov-2 infection and should not be used as the sole basis for treatment or other patient management decisions. A negative result may occur with  improper specimen collection/handling, submission of specimen other than nasopharyngeal swab, presence of viral mutation(s) within the areas targeted by this assay, and inadequate number of viral copies(<138 copies/mL). A negative result must be combined with clinical observations, patient history, and  epidemiological information. The expected result is Negative.  Fact Sheet for Patients:  BloggerCourse.com  Fact Sheet for Healthcare Providers:  SeriousBroker.it  This test is no t yet approved or cleared by the [place] FDA and  has been authorized for detection and/or diagnosis of SARS-CoV-2 by FDA under an Emergency Use Authorization (EUA). This EUA will remain  in effect (meaning this test can be used) for the duration of the COVID-19 declaration under Section 564(b)(1) of the Act, 21 U.S.C.section 360bbb-3(b)(1), unless the authorization is terminated  or revoked sooner.       Influenza A by PCR NEGATIVE NEGATIVE Final   Influenza B by PCR NEGATIVE NEGATIVE Final    Comment: (NOTE) The Xpert Xpress SARS-CoV-2/FLU/RSV plus assay is intended as an aid in the diagnosis of influenza from Nasopharyngeal swab specimens and should not be used as a sole basis for treatment. Nasal washings and aspirates are unacceptable for Xpert Xpress SARS-CoV-2/FLU/RSV testing.  Fact Sheet for  Patients: BloggerCourse.com  Fact Sheet for Healthcare Providers: SeriousBroker.it  This test is not yet approved or cleared by the [place] FDA and has been authorized for detection and/or diagnosis of SARS-CoV-2 by FDA under an Emergency Use Authorization (EUA). This EUA will remain in effect (meaning this test can be used) for the duration of the COVID-19 declaration under Section 564(b)(1) of the Act, 21 U.S.C. section 360bbb-3(b)(1), unless the authorization is terminated or revoked.  Performed at [facility], [address], [place], [place] [address]   Urine culture     Status: Abnormal   Collection Time: 08/29/20  2:45 AM   Specimen: Urine, Clean Catch  Result Value Ref Range Status   Specimen Description   Final    URINE, CLEAN CATCH Performed at [facility], [address] [place], [place] [address]    Special Requests   Final    NONE Performed at [facility], [address], [place], [place] [address]    Culture (A)  Final    <10,000 COLONIES/mL INSIGNIFICANT GROWTH Performed at [facility], [address] [place], [place] [address]    Report Status 08/30/2020 FINAL  Final  Culture, blood (routine x 2)     Status: None (Preliminary result)   Collection Time: 08/30/20 11:38 PM   Specimen: BLOOD  Result Value Ref Range Status   Specimen Description   Final    BLOOD BLOOD LEFT FOREARM Performed at [facility], [address], [place], [place] [address]    Special Requests   Final    BOTTLES DRAWN AEROBIC ONLY Blood Culture adequate volume Performed at [facility], [address], [place], [place] [address]    Culture   Final    NO GROWTH 4 DAYS Performed at [facility], [address], [place], [place] [address]    Report Status PENDING  Incomplete  Culture, blood (routine x 2)     Status: None (Preliminary result)   Collection Time: 08/30/20 11:38 PM   Specimen: BLOOD  Result Value Ref Range Status   Specimen Description   Final    BLOOD LEFT FINGER Performed at [facility], [address], [place], [place] [address]    Special Requests   Final    BOTTLES DRAWN AEROBIC ONLY Blood Culture adequate volume Performed at [facility], [address] [place], [place] [address]    Culture   Final    NO GROWTH 4 DAYS Performed at [facility], [address] [place], [place] [address]    Report Status PENDING  Incomplete     Labs: Basic Metabolic Panel: Recent Labs  Lab 08/28/20 2338 09/02/20 0500 09/04/20 0723  NA 138 134* 136  K 4.3 4.7 4.6  CL 106 101 102  CO2 27 27 28   GLUCOSE 88 112* 98  BUN 8 15 13   CREATININE 0.74 1.09 0.80  CALCIUM 8.9 8.9 8.8*   Liver Function  Tests: Recent Labs  Lab 08/28/20 2338 09/04/20 0723  AST 36 54*  ALT 16 20  ALKPHOS 57 57  BILITOT 7.1* 4.8*  PROT 9.2* 8.5*  ALBUMIN 3.9 3.4*   No results for input(s): LIPASE, AMYLASE in the last 168 hours. No results for input(s): AMMONIA in the last 168 hours. CBC: Recent Labs  Lab 08/28/20 2338 08/30/20 1134 09/01/20 0519 09/02/20 0500 09/04/20 0723  WBC 12.6* 10.7* 14.0* 12.3* 11.4*  NEUTROABS 4.4 2.9 3.5 4.0 2.5  HGB 8.8* 8.8* 8.2* 7.6* 7.2*  HCT 23.7* 24.2* 21.3* 4.7* 5.4*  MCV 95.6 95.7 91.4 90.4 94.7  PLT 295 323 327 223 313   Cardiac Enzymes: Recent Labs  Lab 08/29/20 0659  CKTOTAL 17*   BNP: Invalid input(s): POCBNP CBG: No results for input(s): GLUCAP in the last 168 hours.  Time coordinating discharge: 35 minutes  Signed:  [name]  APRN, [name], [name] Patient Care [facility] [address] [place], [place] [address] [phone]  [facility] 09/04/2020, 5:34 PM

## 2020-09-04 NOTE — Progress Notes (Signed)
Pt discharged home in stable condition. Discharge instructions given. No immediate questions or concerns. Pt chose to ambulate off unit.

## 2020-09-04 NOTE — Progress Notes (Signed)
Reviewed PDMP substance reporting system prior to prescribing opiate medications. No inconsistencies noted.   Meds ordered this encounter  Medications  . oxyCODONE-acetaminophen (PERCOCET) 10-325 MG tablet    Sig: Take 1 tablet by mouth every 4 (four) hours as needed for up to 15 days for pain.    Dispense:  90 tablet    Refill:  0    Please do not fill this medication prior to 06/28/2020. Thank you.    Order Specific Question:   Supervising Provider    Answer:   Quentin Angst [[id number]]    [name]  APRN, [name], [name] Patient Care [facility] [address] [place], [place] [address] [phone]

## 2020-09-05 LAB — CULTURE, BLOOD (ROUTINE X 2)
Culture: NO GROWTH
Culture: NO GROWTH
Special Requests: ADEQUATE
Special Requests: ADEQUATE

## 2020-09-05 NOTE — Discharge Summary (Signed)
Physician Discharge Summary  Patient ID: Martin [name] MRN: [id number] DOB/AGE: Feb 03, 1991 30 y.o.  Admit date: 08/26/2020 Discharge date: 08/26/2020  Admission Diagnoses:  Discharge Diagnoses:  Active Problems:   Sickle cell disease with crisis (HCC)   HIV (human immunodeficiency virus infection) (HCC)   History of pulmonary embolus (PE)   Chronic, continuous use of opioids   Acute and chronic respiratory failure with hypoxia (HCC)   Acute chest syndrome [facility])   Discharged Condition: serious  Hospital Course: Patient was admitted earlier in the day with sickle cell acute chest syndrome, sepsis in the setting of known HIV disease.  Also acute on chronic respiratory failure with hypoxia.  After admission and initiation of treatment including IV antibiotics, Dilaudid PCA, home anticoagulation and anti-HIV medications, other supportive care within hours of admission patient decided to leave AGAINST MEDICAL ADVICE.  His cultures later came back with positive blood cultures.  Patient has been notified.  His PCP has also been notified about results for close follow-up.  Consults: None  Significant Diagnostic Studies: labs: CBC and CMP as well as blood cultures obtained.  Treatments: IV hydration, antibiotics: Levaquin and analgesia: Dilaudid  Discharge Exam: Blood pressure 107/77, pulse 63, temperature 98.2 F (36.8 C), resp. rate 14, height 6\' 3"  (1.905 m), weight 70.1 kg, SpO2 91 %. General appearance: alert and delirious  Disposition:  There are no questions and answers to display.         Allergies as of 08/26/2020      Reactions   Ketorolac Tromethamine Swelling   Patient reports facial edema and left arm edema after administration.   Tape Rash, Other (See Comments)   PLEASE DO NOT USE THE CLEAR, THICK, "PLASTIC" TAPE- only paper tape is tolerated       Medication List    ASK your doctor about these medications   ALPRAZolam 1 MG tablet Commonly known as:  XANAX Take 1 mg by mouth 3 (three) times daily as needed for anxiety.   apixaban 5 MG Tabs tablet Commonly known as: Eliquis Take 1 tablet (5 mg total) by mouth 2 (two) times daily.   cetirizine 10 MG tablet Commonly known as: ZyrTEC Allergy Take 1 tablet (10 mg total) by mouth daily.   hydroxyurea 500 MG capsule Commonly known as: HYDREA Take 4 capsules (2,000 mg total) by mouth at bedtime.   multivitamin with minerals Tabs tablet Take 1 tablet by mouth daily with breakfast.   Triumeq 600-50-300 MG tablet Generic drug: abacavir-dolutegravir-lamiVUDine Take 1 tablet by mouth at bedtime.   Vitamin D (Ergocalciferol) 1.25 MG (50000 UNIT) Caps capsule Commonly known as: DRISDOL Take 1 capsule (50,000 Units total) by mouth every 7 (seven) days.        Signed7/12/2020 09/05/2020, 11:17 PM

## 2020-09-17 ENCOUNTER — Telehealth: Payer: Self-pay

## 2020-09-17 NOTE — Telephone Encounter (Signed)
Med refill Percocet 

## 2020-09-18 ENCOUNTER — Other Ambulatory Visit: Payer: Self-pay | Admitting: Internal Medicine

## 2020-09-18 ENCOUNTER — Telehealth: Payer: Self-pay

## 2020-09-18 DIAGNOSIS — D571 Sickle-cell disease without crisis: Secondary | ICD-10-CM

## 2020-09-18 DIAGNOSIS — G8929 Other chronic pain: Secondary | ICD-10-CM

## 2020-09-18 MED ORDER — OXYCODONE-ACETAMINOPHEN 10-325 MG PO TABS: 1.0000 | ORAL_TABLET | ORAL | 0 refills | Status: DC | PRN

## 2020-09-18 NOTE — Telephone Encounter (Signed)
Oxycodone  °

## 2020-10-02 ENCOUNTER — Telehealth: Payer: Self-pay

## 2020-10-02 ENCOUNTER — Other Ambulatory Visit: Payer: Self-pay | Admitting: Family Medicine

## 2020-10-02 DIAGNOSIS — G8929 Other chronic pain: Secondary | ICD-10-CM

## 2020-10-02 DIAGNOSIS — D571 Sickle-cell disease without crisis: Secondary | ICD-10-CM

## 2020-10-02 MED ORDER — OXYCODONE-ACETAMINOPHEN 10-325 MG PO TABS: 1.0000 | ORAL_TABLET | ORAL | 0 refills | Status: DC | PRN

## 2020-10-02 NOTE — Progress Notes (Signed)
Reviewed PDMP substance reporting system prior to prescribing opiate medications. No inconsistencies noted.   Meds ordered this encounter  Medications  . oxyCODONE-acetaminophen (PERCOCET) 10-325 MG tablet    Sig: Take 1 tablet by mouth every 4 (four) hours as needed for up to 15 days for pain.    Dispense:  90 tablet    Refill:  0    Order Specific Question:   Supervising Provider    Answer:   JEGEDE, OLUGBEMIGA E [[id number]]    Martin [name] Kha Hari  APRN, [name], [name] [facility] Care [facility] [address]  [place],  [address] [phone]  

## 2020-10-02 NOTE — Telephone Encounter (Signed)
Oxycodone 10mg

## 2020-10-07 ENCOUNTER — Other Ambulatory Visit: Payer: Self-pay | Admitting: Family Medicine

## 2020-10-07 ENCOUNTER — Telehealth: Payer: Self-pay

## 2020-10-07 DIAGNOSIS — D571 Sickle-cell disease without crisis: Secondary | ICD-10-CM

## 2020-10-07 DIAGNOSIS — G8929 Other chronic pain: Secondary | ICD-10-CM

## 2020-10-07 MED ORDER — HYDROXYUREA 500 MG PO CAPS: 2000.0000 mg | ORAL_CAPSULE | Freq: Every day | ORAL | 11 refills | Status: DC

## 2020-10-07 NOTE — Progress Notes (Signed)
Meds ordered this encounter  Medications   hydroxyurea (HYDREA) 500 MG capsule    Sig: Take 4 capsules (2,000 mg total) by mouth at bedtime.    Dispense:  120 capsule    Refill:  11    Order Specific Question:   Supervising Provider    Answer:   Quentin Angst [[id number]]

## 2020-10-07 NOTE — Telephone Encounter (Signed)
Hydroxyurea Percocet  Pt stated that a tree on your house and LOST everything

## 2020-10-08 ENCOUNTER — Inpatient Hospital Stay: Payer: Medicaid Other | Admitting: Family Medicine

## 2020-10-10 ENCOUNTER — Telehealth: Payer: Self-pay

## 2020-10-10 ENCOUNTER — Telehealth: Payer: Self-pay | Admitting: Family Medicine

## 2020-10-10 NOTE — Telephone Encounter (Signed)
Please call the pharmacy and tell them it's ok to fill  Per pt L[place] sent in the med's to be refilled   Blood thinners are are needed

## 2020-10-10 NOTE — Telephone Encounter (Signed)
Martin [name] is a 30 year old male with a medical history significant for sickle cell disease, chronic pain syndrome, opiate dependence and tolerance, history of HIV disease, and history of anemia of chronic disease is requesting a replacement of Percocet 10-3 25, #90.  He states that a tree fell on his house and he lost everything.  Unable to resend medication at this time, patient will warrant follow-up police report for loss medication.  Patient states that his pharmacist has police report and he will have it faxed.   [name]  APRN, [name], [name] Patient Care [facility] [address] [place], [place] [address] [phone]

## 2020-10-14 ENCOUNTER — Other Ambulatory Visit: Payer: Self-pay | Admitting: Family Medicine

## 2020-10-14 ENCOUNTER — Telehealth: Payer: Self-pay

## 2020-10-14 DIAGNOSIS — D571 Sickle-cell disease without crisis: Secondary | ICD-10-CM

## 2020-10-14 DIAGNOSIS — G8929 Other chronic pain: Secondary | ICD-10-CM

## 2020-10-14 MED ORDER — OXYCODONE-ACETAMINOPHEN 10-325 MG PO TABS: 1.0000 | ORAL_TABLET | ORAL | 0 refills | Status: DC | PRN

## 2020-10-14 NOTE — Telephone Encounter (Signed)
Pt said A verbal consent has to be done to pharmacy to refill med's

## 2020-10-14 NOTE — Progress Notes (Signed)
Reviewed PDMP substance reporting system prior to prescribing opiate medications. No inconsistencies noted.  Meds ordered this encounter  Medications   oxyCODONE-acetaminophen (PERCOCET) 10-325 MG tablet    Sig: Take 1 tablet by mouth every 4 (four) hours as needed for up to 15 days for pain.    Dispense:  90 tablet    Refill:  0    Please do not refill this prescription prior to 10/16/2020, no exceptions. If there are questions, have the patient contact my office. Thank you.    Order Specific Question:   Supervising Provider    Answer:   JEGEDE, OLUGBEMIGA E [[id number]]      Martin [name] Martin Thall  APRN, [name], [name] [facility] Care [facility] [address]  [place], [place] [address] [phone]  

## 2020-10-29 ENCOUNTER — Telehealth: Payer: Self-pay

## 2020-10-29 NOTE — Telephone Encounter (Signed)
Percocet  [facility] [address] [employer]

## 2020-10-30 ENCOUNTER — Other Ambulatory Visit: Payer: Self-pay | Admitting: Family Medicine

## 2020-10-30 DIAGNOSIS — G8929 Other chronic pain: Secondary | ICD-10-CM

## 2020-10-30 DIAGNOSIS — D571 Sickle-cell disease without crisis: Secondary | ICD-10-CM

## 2020-10-30 MED ORDER — OXYCODONE-ACETAMINOPHEN 10-325 MG PO TABS: 1.0000 | ORAL_TABLET | ORAL | 0 refills | Status: DC | PRN

## 2020-10-30 NOTE — Progress Notes (Signed)
Reviewed PDMP substance reporting system prior to prescribing opiate medications. No inconsistencies noted.  Meds ordered this encounter  Medications   oxyCODONE-acetaminophen (PERCOCET) 10-325 MG tablet    Sig: Take 1 tablet by mouth every 4 (four) hours as needed for up to 15 days for pain.    Dispense:  90 tablet    Refill:  0    Please do not refill this prescription prior to 10/16/2020, no exceptions. If there are questions, have the patient contact my office. Thank you.    Order Specific Question:   Supervising Provider    Answer:   Quentin Angst [[id number]]      [name]  APRN, [name], [name] Patient Care [facility] [address] [place], [place] [address] [phone]

## 2020-11-13 ENCOUNTER — Telehealth: Payer: Self-pay

## 2020-11-13 NOTE — Telephone Encounter (Signed)
Oxycodone  [facility] @ [address] [place], [place] [address]

## 2020-11-14 ENCOUNTER — Other Ambulatory Visit: Payer: Self-pay | Admitting: Family Medicine

## 2020-11-14 DIAGNOSIS — D571 Sickle-cell disease without crisis: Secondary | ICD-10-CM

## 2020-11-14 DIAGNOSIS — G8929 Other chronic pain: Secondary | ICD-10-CM

## 2020-11-14 MED ORDER — OXYCODONE-ACETAMINOPHEN 10-325 MG PO TABS: 1.0000 | ORAL_TABLET | ORAL | 0 refills | Status: DC | PRN

## 2020-11-14 NOTE — Progress Notes (Signed)
Reviewed PDMP substance reporting system prior to prescribing opiate medications. No inconsistencies noted.  Meds ordered this encounter  Medications   oxyCODONE-acetaminophen (PERCOCET) 10-325 MG tablet    Sig: Take 1 tablet by mouth every 4 (four) hours as needed for up to 15 days for pain.    Dispense:  90 tablet    Refill:  0    Please do not refill this prescription prior to 10/16/2020, no exceptions. If there are questions, have the patient contact my office. Thank you.    Order Specific Question:   Supervising Provider    Answer:   JEGEDE, OLUGBEMIGA E [[id number]]      Martin Vandeberg Moore [name]  APRN, [name], [name] Patient Care [facility] [address]  [place], [place] [address] [phone]  

## 2020-11-19 ENCOUNTER — Ambulatory Visit (INDEPENDENT_AMBULATORY_CARE_PROVIDER_SITE_OTHER): Payer: Medicaid Other | Admitting: Family Medicine

## 2020-11-19 ENCOUNTER — Other Ambulatory Visit: Payer: Self-pay

## 2020-11-19 VITALS — BP 114/64 | HR 86 | Temp 98.3°F | Ht 75.0 in | Wt 143.0 lb

## 2020-11-19 DIAGNOSIS — G8929 Other chronic pain: Secondary | ICD-10-CM

## 2020-11-19 DIAGNOSIS — E559 Vitamin D deficiency, unspecified: Secondary | ICD-10-CM | POA: Diagnosis not present

## 2020-11-19 DIAGNOSIS — D571 Sickle-cell disease without crisis: Secondary | ICD-10-CM

## 2020-11-19 DIAGNOSIS — Z23 Encounter for immunization: Secondary | ICD-10-CM | POA: Diagnosis not present

## 2020-11-19 NOTE — Progress Notes (Signed)
[facility]   Established Patient Office Visit  Subjective:  Patient ID: Martin [name], male    DOB: [birth year]  Age: 30 y.o. MRN: [id number]  CC:  Chief Complaint  Patient presents with   Hospitalization Follow-up    10/26/2020 - 10/31/2020; Sickle cell    HPI Martin [name] is a 30 year old male with a medical history significant for sickle cell disease, chronic pain syndrome, opiate dependence and tolerance, history of HIV, history of anemia of chronic disease, and history of PE on Eliquis presents for follow-up of chronic conditions.  Patient states that he has been doing fairly well but has had increased chronic pain over the past several weeks that has not been relieved by his home medications.  He says that he has had several ER visits due to chronic pain exacerbations.  Patient attributes increased pain to changes in weather.  Also, he has been traveling recently.  He last had Percocet this a.m. without sustained relief.  Pain intensity is 6/10 primarily to low back and lower extremities.  Patient currently denies headache, chest pain, urinary symptoms, nausea, vomiting, or diarrhea.  Patient says that he is up-to-date with vaccinations.  He is fully vaccinated against COVID-19.  Past Medical History:  Diagnosis Date   Anxiety    HIV (human immunodeficiency virus infection) (HCC)    Proteinuria    Sickle cell crisis (HCC)    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HCC)    Vitamin D deficiency 10/2018    Past Surgical History:  Procedure Laterality Date   IR IMAGING GUIDED PORT INSERTION  08/29/2019   IR REMOVAL TUN ACCESS W/ PORT W/O FL MOD SED  08/30/2020   TEE WITHOUT CARDIOVERSION N/A 08/30/2020   Procedure: TRANSESOPHAGEAL ECHOCARDIOGRAM (TEE);  Surgeon: [name], MD;  Location: [facility] ENDOSCOPY;  Service: Cardiovascular;  Laterality: N/A;    Family History  Problem Relation Age of Onset   Sickle cell trait Mother    Sickle  cell trait Father    Birth defects Maternal Grandmother    Birth defects Paternal Grandmother     Social History   Socioeconomic History   Marital status: Single    Spouse name: Not on file   Number of children: Not on file   Years of education: Not on file   Highest education level: Not on file  Occupational History   Not on file  Tobacco Use   Smoking status: Never   Smokeless tobacco: Never  Vaping Use   Vaping Use: Some days   Substances: THC  Substance and Sexual Activity   Alcohol use: No   Drug use: Yes    Types: Marijuana   Sexual activity: Yes    Birth control/protection: Condom  Other Topics Concern   Not on file  Social History Narrative   Not on file   Social Determinants of Health   Financial Resource Strain: Not on file  Food Insecurity: Not on file  Transportation Needs: Not on file  Physical Activity: Not on file  Stress: Not on file  Social Connections: Not on file  Intimate Partner Violence: Not on file    Outpatient Medications Prior to Visit  Medication Sig Dispense Refill   abacavir-dolutegravir-lamiVUDine (TRIUMEQ) 600-50-300 MG tablet Take 1 tablet by mouth at bedtime.      ALPRAZolam (XANAX) 1 MG tablet Take 1 mg by mouth 3 (three) times daily as needed for anxiety.     apixaban (ELIQUIS) 5 MG TABS  tablet Take 1 tablet (5 mg total) by mouth 2 (two) times daily. 60 tablet 5   cetirizine (ZYRTEC ALLERGY) 10 MG tablet Take 1 tablet (10 mg total) by mouth daily. (Patient taking differently: Take 10 mg by mouth daily as needed for allergies or rhinitis.) 30 tablet 2   hydroxyurea (HYDREA) 500 MG capsule Take 4 capsules (2,000 mg total) by mouth at bedtime. 120 capsule 11   Multiple Vitamin (MULTIVITAMIN WITH MINERALS) TABS tablet Take 1 tablet by mouth daily with breakfast.      oxyCODONE-acetaminophen (PERCOCET) 10-325 MG tablet Take 1 tablet by mouth every 4 (four) hours as needed for up to 15 days for pain. 90 tablet 0   Vitamin D,  Ergocalciferol, (DRISDOL) 1.25 MG (50000 UT) CAPS capsule Take 1 capsule (50,000 Units total) by mouth every 7 (seven) days. (Patient taking differently: Take 50,000 Units by mouth every Tuesday.) 5 capsule 3   Biotin 1000 MCG tablet Take by mouth.     No facility-administered medications prior to visit.    Allergies  Allergen Reactions   Ketorolac Tromethamine Swelling and Other (See Comments)    Patient reports facial edema and left arm edema after administration.   Tape Rash and Other (See Comments)    PLEASE DO NOT USE THE CLEAR, THICK, "PLASTIC" TAPE- only paper tape is tolerated    Wound Dressing Adhesive Rash    ROS Review of Systems  Constitutional:  Negative for chills and diaphoresis.  HENT: Negative.    Eyes: Negative.   Respiratory: Negative.    Gastrointestinal: Negative.   Endocrine: Negative.   Genitourinary: Negative.   Musculoskeletal:  Positive for arthralgias and back pain.  Skin: Negative.   Neurological: Negative.   Psychiatric/Behavioral: Negative.       Objective:    Physical Exam Constitutional:      Appearance: Normal appearance.  Eyes:     Pupils: Pupils are equal, round, and reactive to light.  Cardiovascular:     Rate and Rhythm: Normal rate and regular rhythm.     Pulses: Normal pulses.  Pulmonary:     Effort: Pulmonary effort is normal.     Breath sounds: Normal breath sounds.  Abdominal:     General: Abdomen is flat. Bowel sounds are normal.  Musculoskeletal:        General: Normal range of motion.  Skin:    General: Skin is warm.  Neurological:     General: No focal deficit present.     Mental Status: He is alert. Mental status is at baseline.  Psychiatric:        Mood and Affect: Mood normal.        Thought Content: Thought content normal.        Judgment: Judgment normal.    BP 114/64   Pulse 86   Temp 98.3 F (36.8 C)   Ht 6\' 3"  (1.905 m)   Wt 143 lb 0.6 oz (64.9 kg)   SpO2 90%   BMI 17.88 kg/m  Wt Readings from  Last 3 Encounters:  11/19/20 143 lb 0.6 oz (64.9 kg)  09/03/20 153 lb (69.4 kg)  08/26/20 154 lb 8.7 oz (70.1 kg)     Health Maintenance Due  Topic Date Due   Pneumococcal Vaccine 75-22 Years old (1 - PCV) Never done   COVID-19 Vaccine (3 - Mixed Product risk series) 11/14/2019   INFLUENZA VACCINE  11/18/2020    There are no preventive care reminders to display for this patient.  No  results found for: TSH Lab Results  Component Value Date   WBC 11.4 (H) 09/04/2020   HGB 7.2 (L) 09/04/2020   HCT 5.4 (L) 09/04/2020   MCV 94.7 09/04/2020   PLT 313 09/04/2020   Lab Results  Component Value Date   NA 136 09/04/2020   K 4.6 09/04/2020   CO2 28 09/04/2020   GLUCOSE 98 09/04/2020   BUN 13 09/04/2020   CREATININE 0.80 09/04/2020   BILITOT 4.8 (H) 09/04/2020   ALKPHOS 57 09/04/2020   AST 54 (H) 09/04/2020   ALT 20 09/04/2020   PROT 8.5 (H) 09/04/2020   ALBUMIN 3.4 (L) 09/04/2020   CALCIUM 8.8 (L) 09/04/2020   ANIONGAP 6 09/04/2020   No results found for: CHOL No results found for: HDL No results found for: LDLCALC No results found for: [name] No results found for: CHOLHDL Lab Results  Component Value Date   HGBA1C (L) 05/22/2009    <4.0 (NOTE) The ADA recommends the following therapeutic goal for glycemic control related to Hgb A1c measurement: Goal of therapy: <6.5 Hgb A1c  Reference: American Diabetes Association: Clinical Practice Recommendations 2010, Diabetes Care, 2010, 33: (Suppl  1).      Assessment & Plan:   Problem List Items Addressed This Visit       Other   Vitamin D deficiency - Primary   Other chronic pain   Relevant Orders   585929 11+Oxyco+Alc+Crt-Bund   Other Visit Diagnoses     Hb-SS disease without crisis [facility])       Relevant Orders   [id number] 11+Oxyco+Alc+Crt-Bund   Sickle Cell Panel   Need for pneumococcal vaccine       Relevant Orders   Pneumococcal polysaccharide vaccine 23-valent greater than or equal to 2yo subcutaneous/IM  (Completed)        Follow-up: Return in about 3 months (around 02/19/2021).    [name]  APRN, [name], [name] [facility] [address] [place], [place] [address] [phone]

## 2020-11-21 LAB — CMP14+CBC/D/PLT+FER+RETIC+V...
ALT: 11 IU/L (ref 0–44)
AST: 47 IU/L — ABNORMAL HIGH (ref 0–40)
Albumin/Globulin Ratio: 0.9 — ABNORMAL LOW (ref 1.2–2.2)
Albumin: 4 g/dL — ABNORMAL LOW (ref 4.1–5.2)
Alkaline Phosphatase: 76 IU/L (ref 44–121)
BUN/Creatinine Ratio: 8 — ABNORMAL LOW (ref 9–20)
BUN: 6 mg/dL (ref 6–20)
Basophils Absolute: 0.1 10*3/uL (ref 0.0–0.2)
Basos: 1 %
Bilirubin Total: 9.4 mg/dL — ABNORMAL HIGH (ref 0.0–1.2)
CO2: 19 mmol/L — ABNORMAL LOW (ref 20–29)
Calcium: 9 mg/dL (ref 8.7–10.2)
Chloride: 106 mmol/L (ref 96–106)
Creatinine, Ser: 0.75 mg/dL — ABNORMAL LOW (ref 0.76–1.27)
EOS (ABSOLUTE): 0.1 10*3/uL (ref 0.0–0.4)
Eos: 1 %
Ferritin: 2029 ng/mL — ABNORMAL HIGH (ref 30–400)
Globulin, Total: 4.3 g/dL (ref 1.5–4.5)
Glucose: 115 mg/dL — ABNORMAL HIGH (ref 65–99)
Hematocrit: 24 % — ABNORMAL LOW (ref 37.5–51.0)
Hemoglobin: 8.5 g/dL — ABNORMAL LOW (ref 13.0–17.7)
Immature Grans (Abs): 0.1 10*3/uL (ref 0.0–0.1)
Immature Granulocytes: 1 %
Lymphocytes Absolute: 6.5 10*3/uL — ABNORMAL HIGH (ref 0.7–3.1)
Lymphs: 60 %
MCH: 37.1 pg — ABNORMAL HIGH (ref 26.6–33.0)
MCHC: 35.4 g/dL (ref 31.5–35.7)
MCV: 105 fL — ABNORMAL HIGH (ref 79–97)
Monocytes Absolute: 0.8 10*3/uL (ref 0.1–0.9)
Monocytes: 8 %
NRBC: 16 % — ABNORMAL HIGH (ref 0–0)
Neutrophils Absolute: 3.2 10*3/uL (ref 1.4–7.0)
Neutrophils: 29 %
Platelets: 292 10*3/uL (ref 150–450)
Potassium: 4.4 mmol/L (ref 3.5–5.2)
RBC: 2.29 x10E6/uL — CL (ref 4.14–5.80)
RDW: 21.6 % — ABNORMAL HIGH (ref 11.6–15.4)
Retic Ct Pct: 19.2 % — ABNORMAL HIGH (ref 0.6–2.6)
Sodium: 140 mmol/L (ref 134–144)
Total Protein: 8.3 g/dL (ref 6.0–8.5)
Vit D, 25-Hydroxy: 16.5 ng/mL — ABNORMAL LOW (ref 30.0–100.0)
WBC: 10.8 10*3/uL (ref 3.4–10.8)
eGFR: 125 mL/min/{1.73_m2} (ref >59)

## 2020-11-27 LAB — DRUG SCREEN 764883 11+OXYCO+ALC+CRT-BUND
Amphetamines, Urine: NEGATIVE ng/mL
BENZODIAZ UR QL: NEGATIVE ng/mL
Barbiturate: NEGATIVE ng/mL
Cocaine (Metabolite): NEGATIVE ng/mL
Creatinine: 149.6 mg/dL (ref 20.0–300.0)
Ethanol: NEGATIVE %
Meperidine: NEGATIVE ng/mL
Methadone Screen, Urine: NEGATIVE ng/mL
Phencyclidine: NEGATIVE ng/mL
Propoxyphene: NEGATIVE ng/mL
Tramadol: NEGATIVE ng/mL
pH, Urine: 5.8 (ref 4.5–8.9)

## 2020-11-27 LAB — CANNABINOID CONFIRMATION, UR
CANNABINOIDS: POSITIVE — AB
Carboxy THC GC/MS Conf: 23 ng/mL

## 2020-11-27 LAB — OXYCODONE/OXYMORPHONE, CONFIRM
OXYCODONE/OXYMORPH: POSITIVE — AB
OXYCODONE: 1932 ng/mL
OXYCODONE: POSITIVE — AB
OXYMORPHONE (GC/MS): 2376 ng/mL
OXYMORPHONE: POSITIVE — AB

## 2020-11-27 LAB — OPIATES CONFIRMATION, URINE: Opiates: NEGATIVE ng/mL

## 2020-11-28 ENCOUNTER — Telehealth: Payer: Self-pay

## 2020-11-28 NOTE — Telephone Encounter (Signed)
Oxycodone  [facility] in [place]

## 2020-12-02 ENCOUNTER — Other Ambulatory Visit: Payer: Self-pay | Admitting: Internal Medicine

## 2020-12-02 ENCOUNTER — Telehealth: Payer: Self-pay

## 2020-12-02 DIAGNOSIS — D571 Sickle-cell disease without crisis: Secondary | ICD-10-CM

## 2020-12-02 DIAGNOSIS — G8929 Other chronic pain: Secondary | ICD-10-CM

## 2020-12-02 MED ORDER — OXYCODONE-ACETAMINOPHEN 10-325 MG PO TABS: 1.0000 | ORAL_TABLET | ORAL | 0 refills | Status: DC | PRN

## 2020-12-02 NOTE — Telephone Encounter (Signed)
Percocet Zyrtec  [facility] [address] [place] [address]

## 2020-12-03 ENCOUNTER — Other Ambulatory Visit: Payer: Self-pay | Admitting: Family Medicine

## 2020-12-03 DIAGNOSIS — G8929 Other chronic pain: Secondary | ICD-10-CM

## 2020-12-03 DIAGNOSIS — D571 Sickle-cell disease without crisis: Secondary | ICD-10-CM

## 2020-12-03 MED ORDER — OXYCODONE-ACETAMINOPHEN 10-325 MG PO TABS: 1.0000 | ORAL_TABLET | ORAL | 0 refills | Status: DC | PRN

## 2020-12-03 NOTE — Progress Notes (Signed)
Meds ordered this encounter  Medications   oxyCODONE-acetaminophen (PERCOCET) 10-325 MG tablet    Sig: Take 1 tablet by mouth every 4 (four) hours as needed for up to 15 days for pain.    Dispense:  90 tablet    Refill:  0    Order Specific Question:   Supervising Provider    Answer:   Quentin Angst [[id number]]   Reviewed PDMP substance reporting system prior to prescribing opiate medications. No inconsistencies noted.      [name]  APRN, [name], [name] Patient Care [facility] [address] [place], [place] [address] [phone]

## 2020-12-03 NOTE — Telephone Encounter (Signed)
error 

## 2020-12-05 ENCOUNTER — Telehealth: Payer: Self-pay

## 2020-12-05 NOTE — Telephone Encounter (Signed)
Prior Auth initiated via covermymeds Key: [id number] - PA Case ID: [id number]  Drug oxyCODONE-Acetaminophen 10-325MG  tablets

## 2020-12-06 NOTE — Telephone Encounter (Signed)
Approvedon August 18 PA Case: [id number], Status: Approved, Coverage Starts on: 12/05/2020 12:00:00 AM, Coverage Ends on: 06/03/2021 12:00:00 AM.

## 2020-12-17 ENCOUNTER — Telehealth: Payer: Self-pay

## 2020-12-17 ENCOUNTER — Other Ambulatory Visit: Payer: Self-pay | Admitting: Family Medicine

## 2020-12-17 DIAGNOSIS — D571 Sickle-cell disease without crisis: Secondary | ICD-10-CM

## 2020-12-17 DIAGNOSIS — G8929 Other chronic pain: Secondary | ICD-10-CM

## 2020-12-17 MED ORDER — OXYCODONE-ACETAMINOPHEN 10-325 MG PO TABS: 1.0000 | ORAL_TABLET | ORAL | 0 refills | Status: AC | PRN

## 2020-12-17 NOTE — Telephone Encounter (Signed)
Oxycodone  °

## 2020-12-17 NOTE — Progress Notes (Signed)
Reviewed PDMP substance reporting system prior to prescribing opiate medications. No inconsistencies noted.   Meds ordered this encounter  Medications  . oxyCODONE-acetaminophen (PERCOCET) 10-325 MG tablet    Sig: Take 1 tablet by mouth every 4 (four) hours as needed for up to 15 days for pain.    Dispense:  90 tablet    Refill:  0    Order Specific Question:   Supervising Provider    Answer:   JEGEDE, OLUGBEMIGA E [[id number]]    Martin Ivanoff Moore Lemoyne Scarpati  APRN, [name], [name] [facility] [address]  [place], [place] [address] [phone]  

## 2021-01-02 ENCOUNTER — Telehealth: Payer: Self-pay

## 2021-01-02 ENCOUNTER — Other Ambulatory Visit: Payer: Self-pay | Admitting: Family Medicine

## 2021-01-02 DIAGNOSIS — F1190 Opioid use, unspecified, uncomplicated: Secondary | ICD-10-CM

## 2021-01-02 DIAGNOSIS — D571 Sickle-cell disease without crisis: Secondary | ICD-10-CM

## 2021-01-02 MED ORDER — OXYCODONE-ACETAMINOPHEN 10-325 MG PO TABS
1.0000 | ORAL_TABLET | Freq: Four times a day (QID) | ORAL | 0 refills | Status: DC | PRN
Start: 2021-01-03 — End: 2021-01-16

## 2021-01-02 NOTE — Progress Notes (Signed)
Reviewed PDMP substance reporting system prior to prescribing opiate medications. No inconsistencies noted.  Meds ordered this encounter  Medications   oxyCODONE-acetaminophen (PERCOCET) 10-325 MG tablet    Sig: Take 1 tablet by mouth every 6 (six) hours as needed for pain.    Dispense:  90 tablet    Refill:  0    Order Specific Question:   Supervising Provider    Answer:   JEGEDE, OLUGBEMIGA E [[id number]]   Maevyn Riordan Moore Valencia Kassa  APRN, [name], [name] [facility] [address]  [place], [place] [address] [phone]  

## 2021-01-02 NOTE — Telephone Encounter (Signed)
Oxycodone  °

## 2021-01-13 ENCOUNTER — Inpatient Hospital Stay (HOSPITAL_COMMUNITY)
Admission: EM | Admit: 2021-01-13 | Discharge: 2021-01-16 | DRG: 811 | Disposition: A | Discharge status: Home / Self Care | Payer: Medicaid Other | Attending: Internal Medicine | Admitting: Internal Medicine

## 2021-01-13 ENCOUNTER — Emergency Department (HOSPITAL_COMMUNITY): Payer: Medicaid Other

## 2021-01-13 ENCOUNTER — Other Ambulatory Visit: Payer: Self-pay

## 2021-01-13 ENCOUNTER — Encounter (HOSPITAL_COMMUNITY): Payer: Self-pay

## 2021-01-13 DIAGNOSIS — F1190 Opioid use, unspecified, uncomplicated: Secondary | ICD-10-CM

## 2021-01-13 DIAGNOSIS — F411 Generalized anxiety disorder: Secondary | ICD-10-CM | POA: Diagnosis present

## 2021-01-13 DIAGNOSIS — E559 Vitamin D deficiency, unspecified: Secondary | ICD-10-CM | POA: Diagnosis present

## 2021-01-13 DIAGNOSIS — F1120 Opioid dependence, uncomplicated: Secondary | ICD-10-CM | POA: Diagnosis present

## 2021-01-13 DIAGNOSIS — R0902 Hypoxemia: Secondary | ICD-10-CM

## 2021-01-13 DIAGNOSIS — Z86711 Personal history of pulmonary embolism: Secondary | ICD-10-CM | POA: Diagnosis not present

## 2021-01-13 DIAGNOSIS — E860 Dehydration: Secondary | ICD-10-CM | POA: Diagnosis present

## 2021-01-13 DIAGNOSIS — Z20822 Contact with and (suspected) exposure to covid-19: Secondary | ICD-10-CM | POA: Diagnosis present

## 2021-01-13 DIAGNOSIS — Z9109 Other allergy status, other than to drugs and biological substances: Secondary | ICD-10-CM | POA: Diagnosis not present

## 2021-01-13 DIAGNOSIS — G894 Chronic pain syndrome: Secondary | ICD-10-CM | POA: Diagnosis present

## 2021-01-13 DIAGNOSIS — Z832 Family history of diseases of the blood and blood-forming organs and certain disorders involving the immune mechanism: Secondary | ICD-10-CM

## 2021-01-13 DIAGNOSIS — Z21 Asymptomatic human immunodeficiency virus [HIV] infection status: Secondary | ICD-10-CM | POA: Diagnosis present

## 2021-01-13 DIAGNOSIS — D5700 Hb-SS disease with crisis, unspecified: Secondary | ICD-10-CM | POA: Diagnosis not present

## 2021-01-13 DIAGNOSIS — Z79899 Other long term (current) drug therapy: Secondary | ICD-10-CM

## 2021-01-13 DIAGNOSIS — F064 Anxiety disorder due to known physiological condition: Secondary | ICD-10-CM | POA: Diagnosis not present

## 2021-01-13 DIAGNOSIS — R0602 Shortness of breath: Secondary | ICD-10-CM | POA: Diagnosis not present

## 2021-01-13 DIAGNOSIS — J9601 Acute respiratory failure with hypoxia: Secondary | ICD-10-CM | POA: Diagnosis present

## 2021-01-13 DIAGNOSIS — B20 Human immunodeficiency virus [HIV] disease: Secondary | ICD-10-CM | POA: Diagnosis present

## 2021-01-13 DIAGNOSIS — Z886 Allergy status to analgesic agent status: Secondary | ICD-10-CM | POA: Diagnosis not present

## 2021-01-13 DIAGNOSIS — D571 Sickle-cell disease without crisis: Secondary | ICD-10-CM

## 2021-01-13 LAB — RETICULOCYTES
Immature Retic Fract: 47.7 % — ABNORMAL HIGH (ref 2.3–15.9)
RBC.: 2.1 MIL/uL — ABNORMAL LOW (ref 4.22–5.81)
Retic Count, Absolute: 374 10*3/uL — ABNORMAL HIGH (ref 19.0–186.0)
Retic Ct Pct: 17.8 % — ABNORMAL HIGH (ref 0.4–3.1)

## 2021-01-13 LAB — COMPREHENSIVE METABOLIC PANEL
ALT: 28 U/L (ref 0–44)
AST: 74 U/L — ABNORMAL HIGH (ref 15–41)
Albumin: 3.7 g/dL (ref 3.5–5.0)
Alkaline Phosphatase: 61 U/L (ref 38–126)
Anion gap: 8 (ref 5–15)
BUN: 11 mg/dL (ref 6–20)
CO2: 23 mmol/L (ref 22–32)
Calcium: 8.7 mg/dL — ABNORMAL LOW (ref 8.9–10.3)
Chloride: 103 mmol/L (ref 98–111)
Creatinine, Ser: 0.8 mg/dL (ref 0.61–1.24)
GFR, Estimated: >60 mL/min (ref >60)
Glucose, Bld: 103 mg/dL — ABNORMAL HIGH (ref 70–99)
Potassium: 4.9 mmol/L (ref 3.5–5.1)
Sodium: 134 mmol/L — ABNORMAL LOW (ref 135–145)
Total Bilirubin: 11.1 mg/dL — ABNORMAL HIGH (ref 0.3–1.2)
Total Protein: 8.2 g/dL — ABNORMAL HIGH (ref 6.5–8.1)

## 2021-01-13 LAB — RESP PANEL BY RT-PCR (FLU A&B, COVID) ARPGX2
Influenza A by PCR: NEGATIVE
Influenza B by PCR: NEGATIVE
SARS Coronavirus 2 by RT PCR: NEGATIVE

## 2021-01-13 LAB — CBC WITH DIFFERENTIAL/PLATELET
Abs Immature Granulocytes: 0.15 10*3/uL — ABNORMAL HIGH (ref 0.00–0.07)
Basophils Absolute: 0.1 10*3/uL (ref 0.0–0.1)
Basophils Relative: 1 %
Eosinophils Absolute: 0.2 10*3/uL (ref 0.0–0.5)
Eosinophils Relative: 1 %
HCT: 21.3 % — ABNORMAL LOW (ref 39.0–52.0)
Hemoglobin: 8.1 g/dL — ABNORMAL LOW (ref 13.0–17.0)
Immature Granulocytes: 1 %
Lymphocytes Relative: 39 %
Lymphs Abs: 6.2 10*3/uL — ABNORMAL HIGH (ref 0.7–4.0)
MCH: 38 pg — ABNORMAL HIGH (ref 26.0–34.0)
MCHC: 38 g/dL — ABNORMAL HIGH (ref 30.0–36.0)
MCV: 100 fL (ref 80.0–100.0)
Monocytes Absolute: 1.6 10*3/uL — ABNORMAL HIGH (ref 0.1–1.0)
Monocytes Relative: 10 %
Neutro Abs: 7.6 10*3/uL (ref 1.7–7.7)
Neutrophils Relative %: 48 %
Platelets: 328 10*3/uL (ref 150–400)
RBC: 2.13 MIL/uL — ABNORMAL LOW (ref 4.22–5.81)
RDW: 24.9 % — ABNORMAL HIGH (ref 11.5–15.5)
WBC: 15.8 10*3/uL — ABNORMAL HIGH (ref 4.0–10.5)
nRBC: 22.1 % — ABNORMAL HIGH (ref 0.0–0.2)

## 2021-01-13 LAB — SAMPLE TO BLOOD BANK

## 2021-01-13 LAB — TROPONIN I (HIGH SENSITIVITY): Troponin I (High Sensitivity): 5 ng/L (ref <18)

## 2021-01-13 MED ORDER — NALOXONE HCL 0.4 MG/ML IJ SOLN
0.4000 mg | INTRAMUSCULAR | Status: DC | PRN
Start: 2021-01-13 — End: 2021-01-16

## 2021-01-13 MED ORDER — SODIUM CHLORIDE 0.9 % IV SOLN
25.0000 mg | INTRAVENOUS | Status: DC | PRN
  Administered 2021-01-13 – 2021-01-16 (×9): 25 mg via INTRAVENOUS
  Filled 2021-01-13 (×8): qty 25
  Filled 2021-01-13: qty 0.5
  Filled 2021-01-13: qty 25

## 2021-01-13 MED ORDER — SODIUM CHLORIDE 0.9% FLUSH: 9.0000 mL | INTRAVENOUS | Status: DC | PRN

## 2021-01-13 MED ORDER — HYDROMORPHONE 1 MG/ML IV SOLN
INTRAVENOUS | Status: DC
  Administered 2021-01-13: 8.5 mg via INTRAVENOUS
  Administered 2021-01-13: 30 mg via INTRAVENOUS
  Administered 2021-01-13 – 2021-01-14 (×2): 4.5 mg via INTRAVENOUS
  Administered 2021-01-14: 7.5 mg via INTRAVENOUS
  Administered 2021-01-14: 6 mg via INTRAVENOUS
  Administered 2021-01-14: 30 mg via INTRAVENOUS
  Administered 2021-01-14: 7 mg via INTRAVENOUS
  Administered 2021-01-15: 4.5 mg via INTRAVENOUS
  Administered 2021-01-15: 30 mg via INTRAVENOUS
  Administered 2021-01-15: 4 mg via INTRAVENOUS
  Administered 2021-01-15: 2.5 mg via INTRAVENOUS
  Administered 2021-01-15: 30 mg via INTRAVENOUS
  Administered 2021-01-15 (×2): 7 mg via INTRAVENOUS
  Administered 2021-01-15: 4.71 mg via INTRAVENOUS
  Administered 2021-01-16: 2 mg via INTRAVENOUS
  Administered 2021-01-16: 6 mg via INTRAVENOUS
  Filled 2021-01-13 (×4): qty 30

## 2021-01-13 MED ORDER — POLYETHYLENE GLYCOL 3350 17 G PO PACK: 17.0000 g | PACK | Freq: Every day | ORAL | Status: DC | PRN

## 2021-01-13 MED ORDER — APIXABAN 5 MG PO TABS
5.0000 mg | ORAL_TABLET | Freq: Two times a day (BID) | ORAL | Status: DC
  Administered 2021-01-13 – 2021-01-16 (×6): 5 mg via ORAL
  Filled 2021-01-13 (×6): qty 1

## 2021-01-13 MED ORDER — DIPHENHYDRAMINE HCL 50 MG/ML IJ SOLN
12.5000 mg | Freq: Once | INTRAMUSCULAR | Status: AC
  Administered 2021-01-13: 12.5 mg via INTRAVENOUS
  Filled 2021-01-13: qty 1

## 2021-01-13 MED ORDER — DEXTROSE-NACL 5-0.45 % IV SOLN: INTRAVENOUS | Status: DC

## 2021-01-13 MED ORDER — SENNOSIDES-DOCUSATE SODIUM 8.6-50 MG PO TABS
1.0000 | ORAL_TABLET | Freq: Two times a day (BID) | ORAL | Status: DC
Start: 2021-01-13 — End: 2021-01-16
  Administered 2021-01-14 – 2021-01-16 (×5): 1 via ORAL
  Filled 2021-01-13 (×7): qty 1

## 2021-01-13 MED ORDER — ALPRAZOLAM 1 MG PO TABS: 1.0000 mg | ORAL_TABLET | Freq: Three times a day (TID) | ORAL | Status: DC | PRN

## 2021-01-13 MED ORDER — HYDROMORPHONE HCL 2 MG/ML IJ SOLN
2.0000 mg | Freq: Once | INTRAMUSCULAR | Status: AC
  Administered 2021-01-13: 2 mg via INTRAVENOUS
  Filled 2021-01-13: qty 1

## 2021-01-13 MED ORDER — HYDROXYUREA 500 MG PO CAPS
2000.0000 mg | ORAL_CAPSULE | Freq: Every day | ORAL | Status: DC
  Administered 2021-01-13 – 2021-01-15 (×3): 2000 mg via ORAL
  Filled 2021-01-13 (×3): qty 4

## 2021-01-13 MED ORDER — DIPHENHYDRAMINE HCL 25 MG PO CAPS
25.0000 mg | ORAL_CAPSULE | ORAL | Status: DC | PRN
  Filled 2021-01-13: qty 1

## 2021-01-13 MED ORDER — OXYCODONE HCL 5 MG PO TABS
5.0000 mg | ORAL_TABLET | ORAL | Status: DC | PRN
  Administered 2021-01-13 – 2021-01-16 (×10): 5 mg via ORAL
  Filled 2021-01-13 (×10): qty 1

## 2021-01-13 MED ORDER — ONDANSETRON HCL 4 MG/2ML IJ SOLN
4.0000 mg | Freq: Four times a day (QID) | INTRAMUSCULAR | Status: DC | PRN
Start: 2021-01-13 — End: 2021-01-16
  Administered 2021-01-16: 4 mg via INTRAVENOUS
  Filled 2021-01-13: qty 2

## 2021-01-13 MED ORDER — ADULT MULTIVITAMIN W/MINERALS CH
1.0000 | ORAL_TABLET | Freq: Every day | ORAL | Status: DC
  Administered 2021-01-14 – 2021-01-15 (×2): 1 via ORAL
  Filled 2021-01-13 (×3): qty 1

## 2021-01-13 MED ORDER — FOLIC ACID 1 MG PO TABS
1.0000 mg | ORAL_TABLET | Freq: Every day | ORAL | Status: DC
  Administered 2021-01-13 – 2021-01-16 (×4): 1 mg via ORAL
  Filled 2021-01-13 (×4): qty 1

## 2021-01-13 MED ORDER — ONDANSETRON HCL 4 MG/2ML IJ SOLN
4.0000 mg | Freq: Once | INTRAMUSCULAR | Status: AC | PRN
  Administered 2021-01-13: 4 mg via INTRAVENOUS
  Filled 2021-01-13: qty 2

## 2021-01-13 MED ORDER — ABACAVIR-DOLUTEGRAVIR-LAMIVUD 600-50-300 MG PO TABS
1.0000 | ORAL_TABLET | Freq: Every day | ORAL | Status: DC
  Administered 2021-01-13 – 2021-01-15 (×3): 1 via ORAL
  Filled 2021-01-13 (×3): qty 1

## 2021-01-13 MED ORDER — FLEET ENEMA 7-19 GM/118ML RE ENEM
1.0000 | ENEMA | RECTAL | Status: AC | PRN
  Administered 2021-01-13: 1 via RECTAL
  Filled 2021-01-13: qty 1

## 2021-01-13 MED ORDER — OXYCODONE-ACETAMINOPHEN 5-325 MG PO TABS
1.0000 | ORAL_TABLET | ORAL | Status: DC | PRN
  Administered 2021-01-14 – 2021-01-16 (×9): 1 via ORAL
  Filled 2021-01-13 (×9): qty 1

## 2021-01-13 NOTE — Progress Notes (Signed)
Patient refused ABG. MD aware.  

## 2021-01-13 NOTE — H&P (Addendum)
History and Physical    Martin [name] IEP:[id number] DOB: [birth year] DOA: 01/13/2021  PCP: Massie Maroon, [name] Consultants:  Manson Passey - ID Patient coming from:  Home - lives alone; NOK: Mother, [phone]  Chief Complaint: SOB  HPI: Martin [name] is a 30 y.o. male with medical history significant of sickle cell; HIV; chronic pain syndrome; anxiety; and prior PE presenting with SOB. He developed sickle cell pain in his shoulder, low back, and hips.  This is consistent with his usual pain.  He usually takes Percocet 10 for pain; this was not helping this time.  Could be due to stress, weather change, dehydration.  He has been trying but hasn't been eating and drinking well due to stress.  He wears 2L home O2.    ED Course: Carryover, per Dr. Rachael [name]:  30 yo with sickle cell anemia presents with pain and SOB. Had O2 sat of 75%. Uses  2 L/min O2 at home at night but now needing 5 L/min. CTA chest ordered and is pending.   Review of Systems: As per HPI; otherwise review of systems reviewed and negative.   Ambulatory Status:  Ambulates without assistance  COVID Vaccine Status:   Complete  Past Medical History:  Diagnosis Date   Anxiety    HIV (human immunodeficiency virus infection) (HCC)    Proteinuria    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HCC)    Vitamin D deficiency 10/2018    Past Surgical History:  Procedure Laterality Date   IR IMAGING GUIDED PORT INSERTION  08/29/2019   IR REMOVAL TUN ACCESS W/ PORT W/O FL MOD SED  08/30/2020   TEE WITHOUT CARDIOVERSION N/A 08/30/2020   Procedure: TRANSESOPHAGEAL ECHOCARDIOGRAM (TEE);  Surgeon: [name], MD;  Location: [facility] ENDOSCOPY;  Service: Cardiovascular;  Laterality: N/A;    Social History   Socioeconomic History   Marital status: Single    Spouse name: Not on file   Number of children: Not on file   Years of education: Not on file   Highest education level: Not on file  Occupational History   Occupation: disabled   Tobacco Use   Smoking status: Never   Smokeless tobacco: Never  Vaping Use   Vaping Use: Some days   Substances: THC  Substance and Sexual Activity   Alcohol use: No   Drug use: Yes    Types: Marijuana    Comment: daily   Sexual activity: Yes    Birth control/protection: Condom  Other Topics Concern   Not on file  Social History Narrative   Not on file   Social Determinants of Health   Financial Resource Strain: Not on file  Food Insecurity: Not on file  Transportation Needs: Not on file  Physical Activity: Not on file  Stress: Not on file  Social Connections: Not on file  Intimate Partner Violence: Not on file    Allergies  Allergen Reactions   Ketorolac Tromethamine Swelling and Other (See Comments)    Patient reports facial edema and left arm edema after administration.   Tape Rash and Other (See Comments)    PLEASE DO NOT USE THE CLEAR, THICK, "PLASTIC" TAPE- only paper tape is tolerated    Wound Dressing Adhesive Rash    Family History  Problem Relation Age of Onset   Sickle cell trait Mother    Sickle cell trait Father    Birth defects Maternal Grandmother    Birth defects Paternal Grandmother     Prior to Admission medications  Medication Sig Start Date End Date Taking? Authorizing Provider  abacavir-dolutegravir-lamiVUDine (TRIUMEQ) 600-50-300 MG tablet Take 1 tablet by mouth at bedtime.    Yes [provider]  acetaminophen (TYLENOL) 325 MG tablet Take 650 mg by mouth every 6 (six) hours as needed for pain or headache. 11/28/20  Yes [provider]  ALPRAZolam Prudy Feeler) 1 MG tablet Take 1 mg by mouth 3 (three) times daily as needed for anxiety.   Yes [provider]  cetirizine (ZYRTEC ALLERGY) 10 MG tablet Take 1 tablet (10 mg total) by mouth daily. 08/21/20 08/21/21 Yes Massie Maroon, [name]  hydroxyurea (HYDREA) 500 MG capsule Take 4 capsules (2,000 mg total) by mouth at bedtime. 10/07/20  Yes Massie Maroon, [name]  Multiple  Vitamin (MULTIVITAMIN WITH MINERALS) TABS tablet Take 1 tablet by mouth daily with breakfast.    Yes [provider]  naloxone (NARCAN) nasal spray 4 mg/0.1 mL Place 4 mg into the nose as needed for opioid reversal. 05/19/17  Yes [provider]  oxyCODONE-acetaminophen (PERCOCET) 10-325 MG tablet Take 1 tablet by mouth every 6 (six) hours as needed for pain. 01/03/21 01/03/22 Yes Massie Maroon, [name]  Vitamin D, Ergocalciferol, (DRISDOL) 1.25 MG (50000 UT) CAPS capsule Take 1 capsule (50,000 Units total) by mouth every 7 (seven) days. Patient taking differently: Take 50,000 Units by mouth every Tuesday. 11/01/18  Yes [name], [name]    Physical Exam: Vitals:   01/13/21 0717 01/13/21 0730 01/13/21 0807 01/13/21 1038  BP: 106/65 105/64 105/61   Pulse: 78 69 69   Resp: 15 14 16 15   Temp:   98 F (36.7 C)   TempSrc:   Oral   SpO2: 92% 92% 91% 94%  Weight:      Height:         General:  Appears calm and comfortable and is in NAD Eyes:   EOMI, normal lids, iris, +scleral icterus ENT:  grossly normal hearing, lips & tongue, mmm Neck:  no LAD, masses or thyromegaly Cardiovascular:  RRR, no m/r/g. No LE edema.  Respiratory:   CTA bilaterally with no wheezes/rales/rhonchi.  Normal respiratory effort. Abdomen:  soft, mild diffuse TTP, ND Skin:  no rash or induration seen on limited exam, multiple tattoos Musculoskeletal:  grossly normal tone BUE/BLE, good ROM, no bony abnormality Psychiatric:  grossly normal mood and affect, speech fluent and appropriate, AOx3 Neurologic:  CN 2-12 grossly intact, moves all extremities in coordinated fashion    Radiological Exams on Admission: Independently reviewed - see discussion in A/P where applicable  DG Chest Port 1 View  Result Date: 01/13/2021 CLINICAL DATA:  30 year old male with hypoxia. Sickle cell disease. HIV. EXAM: PORTABLE CHEST 1 VIEW COMPARISON:  Chest radiographs 08/28/2020 and earlier. FINDINGS: Portable AP  semi upright view at 0426 hours. Stable mild cardiomegaly. Other mediastinal contours are within normal limits. Visualized tracheal air column is within normal limits. Stable somewhat large lung volumes. Chronic bilateral increased pulmonary interstitial markings, and some architectural distortion, most pronounced at the right lung base. No superimposed pneumothorax, pulmonary edema, pleural effusion or acute pulmonary opacity. No acute osseous abnormality identified. IMPRESSION: Stable mild cardiomegaly and chronic lung disease. No acute cardiopulmonary abnormality. Electronically Signed   By: 10/28/2020 M.D.   On: 01/13/2021 04:36    EKG: Not done   Labs on Admission: I have personally reviewed the available labs and imaging studies at the time of the admission.  Pertinent labs:   AST 74/ALT 28/Bili 11.1  HS troponin 5 WBC 15.8 Hgb 8.1 COVID/flu negative   Assessment/Plan Principal Problem:   Sickle cell pain crisis (HCC) Active Problems:   Generalized anxiety disorder   Human immunodeficiency virus (HIV) disease (HCC)   Sickle cell pain crisis with chronic pain -Patient with lifelong h/o sickle cell and 3 admissions in the last 6 months. -Patient does not have signs of infection but has elevated WBC count.  -CTA pending. -Hemoglobin appears to be at/near baseline. -Admit to [facility]. -Start high-dose Dilaudid PCA protocol for pain -continue hydroxyurea and folic acid -Benadryl for itch -IVF: D5-1/2NS at 75 cc/h -Zofran for nausea -I have reviewed this patient in the  Controlled Substances Reporting System.  He is receiving medications from only one provider and appears to be taking them as prescribed. -He is at particularly high risk of opioid misuse, diversion, or overdose.   HIV -Continue Triumeq  Anxiety d/o -Continue Xanax     Note: This patient has been tested and is negative for the novel coronavirus COVID-19. The patient has been fully vaccinated against  COVID-19.   Level of care: Telemetry DVT prophylaxis: SCDs Code Status:  Full  Family Communication: None present Disposition Plan:  The patient is from: home  Anticipated d/c is to: home without [facility] services  Anticipated d/c date will depend on clinical response to treatment, likely several days  Patient is currently: acutely ill Consults called: None  Admission status:  Admit - It is my clinical opinion that admission to INPATIENT is reasonable and necessary because of the expectation that this patient will require hospital care that crosses at least 2 midnights to treat this condition based on the medical complexity of the problems presented.  Given the aforementioned information, the predictability of an adverse outcome is felt to be significant.    Jonah Blue MD [facility]   How to contact the [facility] Attending or Consulting provider 7A - 7P or covering provider during after hours 7P -7A, for this patient?  Check the care team in [facility] and look for a) attending/consulting TRH provider listed and b) the [facility] team listed Log into [email or web address] and use [place]'s universal password to access. If you do not have the password, please contact the hospital operator. Locate the [facility] provider you are looking for under [facility] and page to a number that you can be directly reached. If you still have difficulty reaching the provider, please page the [facility] (Director on Call) for the Hospitalists listed on amion for assistance.   01/13/2021, 11:05 AM

## 2021-01-13 NOTE — Progress Notes (Signed)
Lazar Vickers is a 30 year old male with a medical history significant for sickle cell disease, chronic pain syndrome, opiate dependence and tolerance, history of HIV, history of anxiety, and history of anemia of chronic disease was admitted overnight for sickle cell pain crisis. Patient states that pain is primarily to bilateral hips, shoulders, and lower extremities.  Pain intensity is 10/10.  Patient attributes current pain crisis to changes in weather and increased stressors at home.  Patient also states that he has been traveling recently.  He denies any chest pain, shortness of breath, urinary symptoms, nausea, vomiting, or diarrhea.  Care plan: Initiate IV Dilaudid PCA with settings of 0.5 mg, 10-minute lockout, and 3 mg/h with a 1 mg loading dose Percocet 10-325 mg every 4 hours as needed for severe breakthrough pain  Labs are consistent with baseline.  Repeat CBC and BMP in a.m. Restart Eliquis 5 mg twice daily  Supplemental oxygen at 2 L/min   Will reassess in a.m.   [name]  APRN, [name], [name] Patient Care [facility] [address] [place], [place] [address] [phone]

## 2021-01-13 NOTE — ED Notes (Signed)
Pt placed on 2L via nasal cannula. (75% saturation room air)

## 2021-01-13 NOTE — ED Triage Notes (Signed)
Pt complains of SCC since yesterday. Pt complains of pain to his shoulders, back, and hips.

## 2021-01-13 NOTE — ED Notes (Signed)
Respiratory called for ABG

## 2021-01-13 NOTE — ED Notes (Signed)
Patient was 75% room air. RN put patient on 5L [place] brought him up to 93%.

## 2021-01-13 NOTE — ED Notes (Signed)
IV team at bedside to place 20G for CTA.

## 2021-01-13 NOTE — ED Provider Notes (Signed)
[facility]-EMERGENCY DEPT Provider Note   CSN: [id number] Arrival date & time: 01/13/21  0347     History Chief Complaint  Patient presents with   Sickle Cell Pain Crisis    Martin [name] is a 30 y.o. male.  30 year old male with a history of sickle cell anemia, HIV presents to the emergency department for complaints of pain.  Reports pain is consistent with past episodes of sickle cell crisis.  His pain began 24 hours ago and has been constant, unrelieved with home pain medications.  Pain is primarily located in his shoulders, low back, hips.  He has not had any fevers, chest pain, abdominal pain, nausea or vomiting.  Was found to be hypoxic in triage, but has not noted any shortness of breath or dyspnea on exertion.  No recent sick contacts.  Has history of acute chest syndrome in May 2022.  Has been compliant with his daily Eliquis.  Denies hemoptysis, leg swelling.   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   Itching of male genitalia 02/22/2020   Abnormal penile discharge, without blood 02/22/2020   Leukocytosis 01/02/2020   Anxiety 11/27/2019   Proteinuria 11/27/2019   Acute on chronic respiratory failure (HCC) 11/16/2019   Scleral icterus    Chronic, continuous use of opioids 08/15/2019   Seasonal allergies 08/15/2019   Chest congestion    Sickle cell anemia with crisis (HCC) 08/04/2019   Other chronic pain 08/04/2019   History of pulmonary embolus (PE)  08/04/2019   Single subsegmental pulmonary embolism without acute cor pulmonale (HCC) 02/10/2019   Acute chest syndrome due to sickle cell crisis (HCC) 02/10/2019   Sickle cell crisis (HCC) 01/17/2019   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) 03/09/2018   Bone pain 03/07/2018   Hip pain 03/07/2018   Acute bronchitis due to Streptococcus 02/21/2018   Heart murmur 02/03/2017   Sickle cell disease with crisis (HCC) 02/02/2017   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HCC) 12/20/2016   Vitamin D deficiency 08/14/2016   Sickle cell pain crisis (HCC) 02/12/2016   High risk medication use 09/27/2014   Generalized anxiety disorder 05/19/2014   Gastro-esophageal reflux disease without esophagitis 05/19/2014   Marijuana use 11/07/2012   Human immunodeficiency virus (HIV) disease (HCC) 05/23/2012    Past Surgical History:  Procedure Laterality Date   IR IMAGING GUIDED PORT INSERTION  08/29/2019   IR REMOVAL TUN ACCESS W/ PORT W/O FL MOD SED  08/30/2020   TEE WITHOUT CARDIOVERSION N/A 08/30/2020   Procedure: TRANSESOPHAGEAL ECHOCARDIOGRAM (TEE);  Surgeon: [name], MD;  Location: [facility] ENDOSCOPY;  Service: Cardiovascular;  Laterality: N/A;       Family History  Problem Relation Age of Onset   Sickle cell trait Mother    Sickle cell trait Father    Birth defects Maternal Grandmother    Birth defects Paternal Grandmother     Social  History   Tobacco Use   Smoking status: Never   Smokeless tobacco: Never  Vaping Use   Vaping Use: Some days   Substances: THC  Substance Use Topics   Alcohol use: No   Drug use: Yes    Types: Marijuana    Home Medications Prior to Admission medications   Medication Sig Start Date End Date Taking? Authorizing Provider  abacavir-dolutegravir-lamiVUDine (TRIUMEQ) 600-50-300 MG tablet Take 1 tablet by mouth at bedtime.     [provider]  ALPRAZolam Prudy Feeler) 1 MG tablet Take 1 mg by mouth 3 (three) times  daily as needed for anxiety.    [provider]  apixaban (ELIQUIS) 5 MG TABS tablet Take 1 tablet (5 mg total) by mouth 2 (two) times daily. 08/21/19   Massie Maroon, [name]  Biotin 1000 MCG tablet Take by mouth.    [provider]  cetirizine (ZYRTEC ALLERGY) 10 MG tablet Take 1 tablet (10 mg total) by mouth daily. Patient taking differently: Take 10 mg by mouth daily as needed for allergies or rhinitis. 08/21/20 08/21/21  Massie Maroon, [name]  hydroxyurea (HYDREA) 500 MG capsule Take 4 capsules (2,000 mg total) by mouth at bedtime. 10/07/20   Massie Maroon, [name]  Multiple Vitamin (MULTIVITAMIN WITH MINERALS) TABS tablet Take 1 tablet by mouth daily with breakfast.     [provider]  oxyCODONE-acetaminophen (PERCOCET) 10-325 MG tablet Take 1 tablet by mouth every 6 (six) hours as needed for pain. 01/03/21 01/03/22  Massie Maroon, [name]  Vitamin D, Ergocalciferol, (DRISDOL) 1.25 MG (50000 UT) CAPS capsule Take 1 capsule (50,000 Units total) by mouth every 7 (seven) days. Patient taking differently: Take 50,000 Units by mouth every Tuesday. 11/01/18   [name], [name]    Allergies    Ketorolac tromethamine, Tape, and Wound dressing adhesive  Review of Systems   Review of Systems Ten systems reviewed and are negative for acute change, except as noted in the HPI.    Physical Exam Updated Vital Signs BP (!) 100/57   Pulse 78   Temp 98.6 F (37 C) (Oral)   Resp 15   Ht 6\' 3"  (1.905 m)   Wt 68 kg   SpO2 91%   BMI 18.75 kg/m   Physical Exam Vitals and nursing note reviewed.  Constitutional:      General: He is not in acute distress.    Appearance: He is well-developed. He is not diaphoretic.     Comments: Appears unwell, but nontoxic  HENT:     Head: Normocephalic and atraumatic.     Right Ear: External ear normal.     Left Ear: External ear normal.  Eyes:     General: Scleral icterus present.     Conjunctiva/sclera: Conjunctivae normal.   Pulmonary:     Effort: Pulmonary effort is normal. No respiratory distress.     Breath sounds: No wheezing, rhonchi or rales.     Comments: Lungs grossly clear to auscultation bilaterally. Chest:     Chest wall: No tenderness.  Abdominal:     Comments: Nontender, nondistended abdomen  Musculoskeletal:        General: Normal range of motion.     Cervical back: Normal range of motion.  Skin:    General: Skin is warm and dry.     Coloration: Skin is not pale.     Findings: No erythema or rash.  Neurological:     Mental Status: He is alert and oriented to  person, place, and time.     Coordination: Coordination normal.     Comments: Ambulatory to exam room as well as from bathroom to bed.  Psychiatric:        Behavior: Behavior normal.    ED Results / Procedures / Treatments   Labs (all labs ordered are listed, but only abnormal results are displayed) Labs Reviewed  CBC WITH DIFFERENTIAL/PLATELET - Abnormal; Notable for the following components:      Result Value   WBC 15.8 (*)    RBC 2.13 (*)    Hemoglobin 8.1 (*)    HCT 21.3 (*)    MCH 38.0 (*)    MCHC 38.0 (*)    RDW 24.9 (*)    nRBC 22.1 (*)    Lymphs Abs 6.2 (*)    Monocytes Absolute 1.6 (*)    Abs Immature Granulocytes 0.15 (*)    All other components within normal limits  RETICULOCYTES - Abnormal; Notable for the following components:   Retic Ct Pct 17.8 (*)    RBC. 2.10 (*)    Retic Count, Absolute 374.0 (*)    Immature Retic Fract 47.7 (*)    All other components within normal limits  COMPREHENSIVE METABOLIC PANEL - Abnormal; Notable for the following components:   Sodium 134 (*)    Glucose, Bld 103 (*)    Calcium 8.7 (*)    Total Protein 8.2 (*)    AST 74 (*)    Total Bilirubin 11.1 (*)    All other components within normal limits  RESP PANEL BY RT-PCR (FLU A&B, COVID) ARPGX2  SAMPLE TO BLOOD BANK  TROPONIN I (HIGH SENSITIVITY)    EKG None  Radiology DG Chest Port 1 View  Result Date:  01/13/2021 CLINICAL DATA:  30 year old male with hypoxia. Sickle cell disease. HIV. EXAM: PORTABLE CHEST 1 VIEW COMPARISON:  Chest radiographs 08/28/2020 and earlier. FINDINGS: Portable AP semi upright view at 0426 hours. Stable mild cardiomegaly. Other mediastinal contours are within normal limits. Visualized tracheal air column is within normal limits. Stable somewhat large lung volumes. Chronic bilateral increased pulmonary interstitial markings, and some architectural distortion, most pronounced at the right lung base. No superimposed pneumothorax, pulmonary edema, pleural effusion or acute pulmonary opacity. No acute osseous abnormality identified. IMPRESSION: Stable mild cardiomegaly and chronic lung disease. No acute cardiopulmonary abnormality. Electronically Signed   By: Odessa Fleming M.D.   On: 01/13/2021 04:36    Procedures .Critical Care Performed by: Antony Madura, PA-C Authorized by: Antony Madura, PA-C   Critical care provider statement:    Critical care time (minutes):  45   Critical care was necessary to treat or prevent imminent or life-threatening deterioration of the following conditions:  Respiratory failure   Critical care was time spent personally by me on the following activities:  Discussions with consultants, evaluation of patient's response to treatment, examination of patient, ordering and performing treatments and interventions, ordering and review of laboratory studies, ordering and review of radiographic studies, pulse oximetry, re-evaluation of patient's condition, obtaining history from patient or surrogate and review of old charts   Medications Ordered in ED Medications  dextrose 5 %-0.45 % sodium chloride infusion ( Intravenous New Bag/Given 01/13/21 0457)  HYDROmorphone (DILAUDID) injection 2 mg (2 mg Intravenous Given 01/13/21 0455)  diphenhydrAMINE (BENADRYL) injection 12.5 mg (12.5 mg Intravenous Given 01/13/21 0455)  ondansetron (ZOFRAN) injection 4 mg (4 mg Intravenous  Given 01/13/21 0455)  HYDROmorphone (DILAUDID) injection 2 mg (2 mg Intravenous Given 01/13/21 0543)  ED Course  I have reviewed the triage vital signs and the nursing notes.  Pertinent labs & imaging results that were available during my care of the patient were reviewed by me and considered in my medical decision making (see chart for details).  Clinical Course as of 01/13/21 0610  Mon Jan 13, 2021  0419 Sats improved to mid 90's on 5L O2 via [place].  Reports use of oxygen at night at times, but no chronic oxygen requirement. [KH]  0539 Hyperbilirubinemia likely related to acute crisis.  CBC and reticulocytes pending.  Chest x-ray without concern for acute chest syndrome.  COVID and flu swab negative.  Patient continues to maintain good saturations on supplemental O2. [KH]  0546 Hemoglobin stable [KH]  0557 Patient with negative CTA chest in December 2021, March 2022, and July 2022. Given compliance with his Eliquis feel that his hypoxia is related to his acute sickle cell crisis rather than PE. [KH]  [phone] Case discussed with Dr. Rachael Darby who will assess the patient in the ED for admission. Will add on CTA given degree of hypoxia on arrival. Sats have remained stable on supplemental O2. [KH]    Clinical Course User Index [KH] Antony Madura, PA-C   MDM Rules/Calculators/A&P                           30 year old male to be admitted to the hospital for management of acute sickle cell crisis with acute respiratory failure.  Noted to have oxygen saturations of 75% on room air on arrival.  His O2 sat has been maintaining in the mid 90s on 5 L supplemental O2.  Denies any associated fevers as well as any chest pain or shortness of breath.  He is not in any respiratory distress.  Vital signs have remained stable since arrival.  Does not meet criteria for SIRS/sepsis.  His chest x-ray presently does not suggest concern for acute chest syndrome.  CTA is pending given history of PE.  The patient reports  full compliance with his daily Eliquis.   Final Clinical Impression(s) / ED Diagnoses Final diagnoses:  Acute respiratory failure with hypoxia (HCC)  Sickle cell crisis [facility])    Rx / DC Orders ED Discharge Orders     None        Antony Madura, PA-C 01/13/21 6578    Geoffery Lyons, MD 01/13/21 [phone]

## 2021-01-14 ENCOUNTER — Inpatient Hospital Stay (HOSPITAL_COMMUNITY): Payer: Medicaid Other

## 2021-01-14 DIAGNOSIS — D5700 Hb-SS disease with crisis, unspecified: Principal | ICD-10-CM

## 2021-01-14 LAB — CBC
HCT: 21.6 % — ABNORMAL LOW (ref 39.0–52.0)
Hemoglobin: 8 g/dL — ABNORMAL LOW (ref 13.0–17.0)
MCH: 37.9 pg — ABNORMAL HIGH (ref 26.0–34.0)
MCHC: 37 g/dL — ABNORMAL HIGH (ref 30.0–36.0)
MCV: 102.4 fL — ABNORMAL HIGH (ref 80.0–100.0)
Platelets: 305 10*3/uL (ref 150–400)
RBC: 2.11 MIL/uL — ABNORMAL LOW (ref 4.22–5.81)
RDW: 22.9 % — ABNORMAL HIGH (ref 11.5–15.5)
WBC: 11.3 10*3/uL — ABNORMAL HIGH (ref 4.0–10.5)
nRBC: 27.9 % — ABNORMAL HIGH (ref 0.0–0.2)

## 2021-01-14 MED ORDER — LIP MEDEX EX OINT
TOPICAL_OINTMENT | CUTANEOUS | Status: AC
  Filled 2021-01-14: qty 7

## 2021-01-14 NOTE — Progress Notes (Signed)
Subjective: Martin [name] is a 30 year old male with a medical history significant for sickle cell disease, chronic pain syndrome, opiate dependence and tolerance, history of generalized anxiety, history of HIV, and history of pulmonary embolism was admitted for sickle cell pain crisis. Today, patient is complaining of significant pain primarily to shoulders and low back.  He rates his pain as 7/10.  He denies any headache, chest pain, shortness of breath, urinary symptoms, nausea, vomiting, or diarrhea.  Objective:  Vital signs in last 24 hours:  Vitals:   01/14/21 0809 01/14/21 1047 01/14/21 1200 01/14/21 1438  BP:  102/65  115/80  Pulse:  72  100  Resp: 16 15 16 14   Temp:  98 F (36.7 C)  98.4 F (36.9 C)  TempSrc:  Oral  Oral  SpO2: 95% 92% 95% 90%  Weight:      Height:        Intake/Output from previous day:   Intake/Output Summary (Last 24 hours) at 01/14/2021 1643 Last data filed at 01/14/2021 01/16/2021 Gross per 24 hour  Intake --  Output 1900 ml  Net -1900 ml    Physical Exam: General: Alert, awake, oriented x3, in no acute distress.  HEENT: [place]/AT PEERL, EOMI Neck: Trachea midline,  no masses, no thyromegal,y no JVD, no carotid bruit OROPHARYNX:  Moist, No exudate/ erythema/lesions.  Heart: Regular rate and rhythm, without murmurs, rubs, gallops, PMI non-displaced, no heaves or thrills on palpation.  Lungs: Clear to auscultation, no wheezing or rhonchi noted. No increased vocal fremitus resonant to percussion  Abdomen: Soft, nontender, nondistended, positive bowel sounds, no masses no hepatosplenomegaly noted..  Neuro: No focal neurological deficits noted cranial nerves II through XII grossly intact. DTRs 2+ bilaterally upper and lower extremities. Strength 5 out of 5 in bilateral upper and lower extremities. Musculoskeletal: No warm swelling or erythema around joints, no spinal tenderness noted. Psychiatric: Patient alert and oriented x3, good insight and cognition,  good recent to remote recall. Lymph node survey: No cervical axillary or inguinal lymphadenopathy noted.  Lab Results:  Basic Metabolic Panel:    Component Value Date/Time   NA 134 (L) 01/13/2021 0408   NA 140 11/19/2020 1152   K 4.9 01/13/2021 0408   CL 103 01/13/2021 0408   CO2 23 01/13/2021 0408   BUN 11 01/13/2021 0408   BUN 6 11/19/2020 1152   CREATININE 0.80 01/13/2021 0408   GLUCOSE 103 (H) 01/13/2021 0408   CALCIUM 8.7 (L) 01/13/2021 0408   CBC:    Component Value Date/Time   WBC 11.3 (H) 01/14/2021 0554   HGB 8.0 (L) 01/14/2021 0554   HGB 8.5 (L) 11/19/2020 1152   HCT 21.6 (L) 01/14/2021 0554   HCT 24.0 (L) 11/19/2020 1152   PLT 305 01/14/2021 0554   PLT 292 11/19/2020 1152   MCV 102.4 (H) 01/14/2021 0554   MCV 105 (H) 11/19/2020 1152   NEUTROABS 7.6 01/13/2021 0408   NEUTROABS 3.2 11/19/2020 1152   LYMPHSABS 6.2 (H) 01/13/2021 0408   LYMPHSABS 6.5 (H) 11/19/2020 1152   MONOABS 1.6 (H) 01/13/2021 0408   EOSABS 0.2 01/13/2021 0408   EOSABS 0.1 11/19/2020 1152   BASOSABS 0.1 01/13/2021 0408   BASOSABS 0.1 11/19/2020 1152    Recent Results (from the past 240 hour(s))  Resp Panel by RT-PCR (Flu A&B, Covid) Nasopharyngeal Swab     Status: None   Collection Time: 01/13/21  4:10 AM   Specimen: Nasopharyngeal Swab; Nasopharyngeal(NP) swabs in vial transport medium  Result Value Ref Range  Status   SARS Coronavirus 2 by RT PCR NEGATIVE NEGATIVE Final    Comment: (NOTE) SARS-CoV-2 target nucleic acids are NOT DETECTED.  The SARS-CoV-2 RNA is generally detectable in upper respiratory specimens during the acute phase of infection. The lowest concentration of SARS-CoV-2 viral copies this assay can detect is 138 copies/mL. A negative result does not preclude SARS-Cov-2 infection and should not be used as the sole basis for treatment or other patient management decisions. A negative result may occur with  improper specimen collection/handling, submission of specimen  other than nasopharyngeal swab, presence of viral mutation(s) within the areas targeted by this assay, and inadequate number of viral copies(<138 copies/mL). A negative result must be combined with clinical observations, patient history, and epidemiological information. The expected result is Negative.  Fact Sheet for Patients:  BloggerCourse.com  Fact Sheet for Healthcare Providers:  SeriousBroker.it  This test is no t yet approved or cleared by the [place] FDA and  has been authorized for detection and/or diagnosis of SARS-CoV-2 by FDA under an Emergency Use Authorization (EUA). This EUA will remain  in effect (meaning this test can be used) for the duration of the COVID-19 declaration under Section 564(b)(1) of the Act, 21 U.S.C.section 360bbb-3(b)(1), unless the authorization is terminated  or revoked sooner.       Influenza A by PCR NEGATIVE NEGATIVE Final   Influenza B by PCR NEGATIVE NEGATIVE Final    Comment: (NOTE) The Xpert Xpress SARS-CoV-2/FLU/RSV plus assay is intended as an aid in the diagnosis of influenza from Nasopharyngeal swab specimens and should not be used as a sole basis for treatment. Nasal washings and aspirates are unacceptable for Xpert Xpress SARS-CoV-2/FLU/RSV testing.  Fact Sheet for Patients: BloggerCourse.com  Fact Sheet for Healthcare Providers: SeriousBroker.it  This test is not yet approved or cleared by the [place] FDA and has been authorized for detection and/or diagnosis of SARS-CoV-2 by FDA under an Emergency Use Authorization (EUA). This EUA will remain in effect (meaning this test can be used) for the duration of the COVID-19 declaration under Section 564(b)(1) of the Act, 21 U.S.C. section 360bbb-3(b)(1), unless the authorization is terminated or revoked.  Performed at [facility], [address], [place], [place] [address]     Studies/Results: DG Chest Port 1 View  Result Date: 01/13/2021 CLINICAL DATA:  30 year old male with hypoxia. Sickle cell disease. HIV. EXAM: PORTABLE CHEST 1 VIEW COMPARISON:  Chest radiographs 08/28/2020 and earlier. FINDINGS: Portable AP semi upright view at 0426 hours. Stable mild cardiomegaly. Other mediastinal contours are within normal limits. Visualized tracheal air column is within normal limits. Stable somewhat large lung volumes. Chronic bilateral increased pulmonary interstitial markings, and some architectural distortion, most pronounced at the right lung base. No superimposed pneumothorax, pulmonary edema, pleural effusion or acute pulmonary opacity. No acute osseous abnormality identified. IMPRESSION: Stable mild cardiomegaly and chronic lung disease. No acute cardiopulmonary abnormality. Electronically Signed   By: Odessa Fleming M.D.   On: 01/13/2021 04:36    Medications: Scheduled Meds:  abacavir-dolutegravir-lamiVUDine  1 tablet Oral QHS   apixaban  5 mg Oral BID   folic acid  1 mg Oral Daily   HYDROmorphone   Intravenous Q4H   hydroxyurea  2,000 mg Oral QHS   multivitamin with minerals  1 tablet Oral Q breakfast   senna-docusate  1 tablet Oral BID   Continuous Infusions:  dextrose 5 % and 0.45% NaCl 20 mL/hr at 01/14/21 1355   diphenhydrAMINE 25 mg (01/14/21  1036)   PRN Meds:.ALPRAZolam, diphenhydrAMINE **OR** diphenhydrAMINE, naloxone **AND** sodium chloride flush, ondansetron (ZOFRAN) IV, oxyCODONE-acetaminophen **AND** oxyCODONE, polyethylene glycol  Consultants: None  Procedures: None  Antibiotics: none  Assessment/Plan: Principal Problem:   Sickle cell pain crisis (HCC) Active Problems:   Generalized anxiety disorder   Human immunodeficiency virus (HIV) disease (HCC)  Sickle cell disease with pain crisis: Continue IV Dilaudid PCA, no changes in settings today Decrease IV fluids to [facility] Monitor vital signs very closely,  reevaluate pain scale regularly, and supplemental oxygen as needed  Chronic pain syndrome: Continue home medications  History of HIV: Continue antiviral medications.  He is followed by infectious disease as an outpatient.  History of PE: Continue apixaban  History of generalized anxiety: Stable.  Patient denies any suicidal homicidal intentions.  Continue alprazolam as needed  Code Status: Full Code Family Communication: N/A Disposition Plan: Not yet ready for discharge  Kethan Papadopoulos Rennis Petty  APRN, [name], [name] [facility] [address] [place], [place] [address] [phone]  If 5PM-8AM, please contact night-coverage.  01/14/2021, 4:43 PM  LOS: 1 day

## 2021-01-15 DIAGNOSIS — D5700 Hb-SS disease with crisis, unspecified: Secondary | ICD-10-CM | POA: Diagnosis not present

## 2021-01-15 LAB — CBC
HCT: 19.9 % — ABNORMAL LOW (ref 39.0–52.0)
Hemoglobin: 7.7 g/dL — ABNORMAL LOW (ref 13.0–17.0)
MCH: 38.1 pg — ABNORMAL HIGH (ref 26.0–34.0)
MCHC: 38.7 g/dL — ABNORMAL HIGH (ref 30.0–36.0)
MCV: 98.5 fL (ref 80.0–100.0)
Platelets: 259 10*3/uL (ref 150–400)
RBC: 2.02 MIL/uL — ABNORMAL LOW (ref 4.22–5.81)
RDW: 21.3 % — ABNORMAL HIGH (ref 11.5–15.5)
WBC: 9.5 10*3/uL (ref 4.0–10.5)
nRBC: 23.2 % — ABNORMAL HIGH (ref 0.0–0.2)

## 2021-01-15 MED ORDER — POLYVINYL ALCOHOL 1.4 % OP SOLN
1.0000 [drp] | OPHTHALMIC | Status: DC | PRN
  Administered 2021-01-15: 1 [drp] via OPHTHALMIC
  Filled 2021-01-15: qty 15

## 2021-01-15 MED ORDER — LIP MEDEX EX OINT
1.0000 "application " | TOPICAL_OINTMENT | CUTANEOUS | Status: DC | PRN
  Administered 2021-01-15: 1 via TOPICAL
  Filled 2021-01-15: qty 7

## 2021-01-15 MED ORDER — ALUM & MAG HYDROXIDE-SIMETH 200-200-20 MG/5ML PO SUSP
30.0000 mL | ORAL | Status: DC | PRN
  Administered 2021-01-15: 30 mL via ORAL
  Filled 2021-01-15: qty 30

## 2021-01-15 NOTE — Progress Notes (Signed)
Subjective: Martin [name] is a 30 year old male with a medical history significant for sickle cell disease, chronic pain syndrome, opiate dependence and tolerance, history of generalized anxiety, history of HIV, and history of pulmonary embolism was admitted for sickle cell crisis. Patient continues to complain of significant pain to shoulders and low back.  He rates his pain as 7/10.  He denies any headache, dizziness, chest pain, shortness of breath, urinary symptoms, nausea, vomiting, or diarrhea.  Objective:  Vital signs in last 24 hours:  Vitals:   01/15/21 0413 01/15/21 0428 01/15/21 0740 01/15/21 0942  BP:  108/70  107/65  Pulse:  87  82  Resp: 16 14 16 15   Temp:  98.1 F (36.7 C)  98.2 F (36.8 C)  TempSrc:  Oral  Oral  SpO2: 90% 91% 94% 92%  Weight:      Height:        Intake/Output from previous day:   Intake/Output Summary (Last 24 hours) at 01/15/2021 1241 Last data filed at 01/15/2021 1127 Gross per 24 hour  Intake 1145.29 ml  Output 3650 ml  Net -2504.71 ml    Physical Exam: General: Alert, awake, oriented x3, in no acute distress.  HEENT: [place]/AT PEERL, EOMI Neck: Trachea midline,  no masses, no thyromegal,y no JVD, no carotid bruit OROPHARYNX:  Moist, No exudate/ erythema/lesions.  Heart: Regular rate and rhythm, without murmurs, rubs, gallops, PMI non-displaced, no heaves or thrills on palpation.  Lungs: Clear to auscultation, no wheezing or rhonchi noted. No increased vocal fremitus resonant to percussion  Abdomen: Soft, nontender, nondistended, positive bowel sounds, no masses no hepatosplenomegaly noted..  Neuro: No focal neurological deficits noted cranial nerves II through XII grossly intact. DTRs 2+ bilaterally upper and lower extremities. Strength 5 out of 5 in bilateral upper and lower extremities. Musculoskeletal: No warm swelling or erythema around joints, no spinal tenderness noted. Psychiatric: Patient alert and oriented x3, good insight and  cognition, good recent to remote recall. Lymph node survey: No cervical axillary or inguinal lymphadenopathy noted.  Lab Results:  Basic Metabolic Panel:    Component Value Date/Time   NA 134 (L) 01/13/2021 0408   NA 140 11/19/2020 1152   K 4.9 01/13/2021 0408   CL 103 01/13/2021 0408   CO2 23 01/13/2021 0408   BUN 11 01/13/2021 0408   BUN 6 11/19/2020 1152   CREATININE 0.80 01/13/2021 0408   GLUCOSE 103 (H) 01/13/2021 0408   CALCIUM 8.7 (L) 01/13/2021 0408   CBC:    Component Value Date/Time   WBC 9.5 01/15/2021 0651   HGB 7.7 (L) 01/15/2021 0651   HGB 8.5 (L) 11/19/2020 1152   HCT 19.9 (L) 01/15/2021 0651   HCT 24.0 (L) 11/19/2020 1152   PLT 259 01/15/2021 0651   PLT 292 11/19/2020 1152   MCV 98.5 01/15/2021 0651   MCV 105 (H) 11/19/2020 1152   NEUTROABS 7.6 01/13/2021 0408   NEUTROABS 3.2 11/19/2020 1152   LYMPHSABS 6.2 (H) 01/13/2021 0408   LYMPHSABS 6.5 (H) 11/19/2020 1152   MONOABS 1.6 (H) 01/13/2021 0408   EOSABS 0.2 01/13/2021 0408   EOSABS 0.1 11/19/2020 1152   BASOSABS 0.1 01/13/2021 0408   BASOSABS 0.1 11/19/2020 1152    Recent Results (from the past 240 hour(s))  Resp Panel by RT-PCR (Flu A&B, Covid) Nasopharyngeal Swab     Status: None   Collection Time: 01/13/21  4:10 AM   Specimen: Nasopharyngeal Swab; Nasopharyngeal(NP) swabs in vial transport medium  Result Value Ref Range Status  SARS Coronavirus 2 by RT PCR NEGATIVE NEGATIVE Final    Comment: (NOTE) SARS-CoV-2 target nucleic acids are NOT DETECTED.  The SARS-CoV-2 RNA is generally detectable in upper respiratory specimens during the acute phase of infection. The lowest concentration of SARS-CoV-2 viral copies this assay can detect is 138 copies/mL. A negative result does not preclude SARS-Cov-2 infection and should not be used as the sole basis for treatment or other patient management decisions. A negative result may occur with  improper specimen collection/handling, submission of specimen  other than nasopharyngeal swab, presence of viral mutation(s) within the areas targeted by this assay, and inadequate number of viral copies(<138 copies/mL). A negative result must be combined with clinical observations, patient history, and epidemiological information. The expected result is Negative.  Fact Sheet for Patients:  BloggerCourse.com  Fact Sheet for Healthcare Providers:  SeriousBroker.it  This test is no t yet approved or cleared by the [place] FDA and  has been authorized for detection and/or diagnosis of SARS-CoV-2 by FDA under an Emergency Use Authorization (EUA). This EUA will remain  in effect (meaning this test can be used) for the duration of the COVID-19 declaration under Section 564(b)(1) of the Act, 21 U.S.C.section 360bbb-3(b)(1), unless the authorization is terminated  or revoked sooner.       Influenza A by PCR NEGATIVE NEGATIVE Final   Influenza B by PCR NEGATIVE NEGATIVE Final    Comment: (NOTE) The Xpert Xpress SARS-CoV-2/FLU/RSV plus assay is intended as an aid in the diagnosis of influenza from Nasopharyngeal swab specimens and should not be used as a sole basis for treatment. Nasal washings and aspirates are unacceptable for Xpert Xpress SARS-CoV-2/FLU/RSV testing.  Fact Sheet for Patients: BloggerCourse.com  Fact Sheet for Healthcare Providers: SeriousBroker.it  This test is not yet approved or cleared by the [place] FDA and has been authorized for detection and/or diagnosis of SARS-CoV-2 by FDA under an Emergency Use Authorization (EUA). This EUA will remain in effect (meaning this test can be used) for the duration of the COVID-19 declaration under Section 564(b)(1) of the Act, 21 U.S.C. section 360bbb-3(b)(1), unless the authorization is terminated or revoked.  Performed at [facility], [address], [place], [place] [address]     Studies/Results: DG Chest Port 1 View  Result Date: 01/14/2021 CLINICAL DATA:  Sickle cell crisis EXAM: PORTABLE CHEST 1 VIEW COMPARISON:  01/13/2021 FINDINGS: Increased interstitial markings, chronic. No focal consolidation. No pleural effusion or pneumothorax. The heart is normal in size. IMPRESSION: No evidence of acute cardiopulmonary disease. Electronically Signed   By: Charline Bills M.D.   On: 01/14/2021 19:05    Medications: Scheduled Meds:  abacavir-dolutegravir-lamiVUDine  1 tablet Oral QHS   apixaban  5 mg Oral BID   folic acid  1 mg Oral Daily   HYDROmorphone   Intravenous Q4H   hydroxyurea  2,000 mg Oral QHS   multivitamin with minerals  1 tablet Oral Q breakfast   senna-docusate  1 tablet Oral BID   Continuous Infusions:  dextrose 5 % and 0.45% NaCl 20 mL/hr at 01/15/21 1045   diphenhydrAMINE 25 mg (01/15/21 1047)   PRN Meds:.ALPRAZolam, diphenhydrAMINE **OR** diphenhydrAMINE, naloxone **AND** sodium chloride flush, ondansetron (ZOFRAN) IV, oxyCODONE-acetaminophen **AND** oxyCODONE, polyethylene glycol  Consultants: None  Procedures: None  Antibiotics: None  Assessment/Plan: Principal Problem:   Sickle cell pain crisis (HCC) Active Problems:   Generalized anxiety disorder   Human immunodeficiency virus (HIV) disease (HCC)  Sickle cell disease with pain crisis:  Continue IV Dilaudid PCA, settings decreased today Continue IV fluids at [facility] Monitor vital signs very closely, reevaluate pain scale regularly  Chronic pain syndrome: Continue home medications  History of HIV: Continue antiviral medications.  Patient is followed by infectious disease.  History of PE: Continue apixaban  History of generalized anxiety: Stable.  Continue alprazolam as needed.  No suicidal homicidal intentions today  Chronic hypoxia: Oxygen saturation is around 95% on 3 L.  Will titrate O2 as tolerated.  Ambulating pulse oximetry today.   Patient has been on home oxygen, but primarily wears it at bedtime.  Code Status: Full Code Family Communication: N/A Disposition Plan: Not yet ready for discharge  Dalia Jollie Rennis Petty  APRN, [name], [name] [facility] [address] [place], [place] [address] [phone]  If 5PM-8 point AM, please contact night-coverage.  01/15/2021, 12:41 PM  LOS: 2 days

## 2021-01-16 LAB — CBC
HCT: 16.6 % — ABNORMAL LOW (ref 39.0–52.0)
Hemoglobin: 6.6 g/dL — CL (ref 13.0–17.0)
MCH: 37.7 pg — ABNORMAL HIGH (ref 26.0–34.0)
MCHC: 39.8 g/dL — ABNORMAL HIGH (ref 30.0–36.0)
MCV: 94.9 fL (ref 80.0–100.0)
Platelets: 234 10*3/uL (ref 150–400)
RBC: 1.75 MIL/uL — ABNORMAL LOW (ref 4.22–5.81)
RDW: 18.7 % — ABNORMAL HIGH (ref 11.5–15.5)
WBC: 11.4 10*3/uL — ABNORMAL HIGH (ref 4.0–10.5)
nRBC: 0.6 % — ABNORMAL HIGH (ref 0.0–0.2)

## 2021-01-16 LAB — HEPATIC FUNCTION PANEL
ALT: 19 U/L (ref 0–44)
AST: 46 U/L — ABNORMAL HIGH (ref 15–41)
Albumin: 3.7 g/dL (ref 3.5–5.0)
Alkaline Phosphatase: 54 U/L (ref 38–126)
Bilirubin, Direct: 0.5 mg/dL — ABNORMAL HIGH (ref 0.0–0.2)
Indirect Bilirubin: 7.6 mg/dL — ABNORMAL HIGH (ref 0.3–0.9)
Total Bilirubin: 8.1 mg/dL — ABNORMAL HIGH (ref 0.3–1.2)
Total Protein: 8.8 g/dL — ABNORMAL HIGH (ref 6.5–8.1)

## 2021-01-16 MED ORDER — PROMETHAZINE HCL 25 MG PO TABS
25.0000 mg | ORAL_TABLET | Freq: Four times a day (QID) | ORAL | Status: DC | PRN
  Administered 2021-01-16: 25 mg via ORAL
  Filled 2021-01-16: qty 1

## 2021-01-16 MED ORDER — OXYCODONE-ACETAMINOPHEN 10-325 MG PO TABS: 1.0000 | ORAL_TABLET | ORAL | 0 refills | Status: DC | PRN

## 2021-01-16 MED ORDER — HYDROMORPHONE 1 MG/ML IV SOLN
INTRAVENOUS | Status: DC
Start: 2021-01-16 — End: 2021-01-16
  Administered 2021-01-16: 1.5 mg via INTRAVENOUS

## 2021-01-16 MED ORDER — OXYCODONE HCL 5 MG PO TABS
5.0000 mg | ORAL_TABLET | ORAL | Status: DC
  Administered 2021-01-16 (×2): 5 mg via ORAL
  Filled 2021-01-16 (×2): qty 1

## 2021-01-16 MED ORDER — CALCIUM CARBONATE ANTACID 500 MG PO CHEW
1.0000 | CHEWABLE_TABLET | Freq: Once | ORAL | Status: AC
  Administered 2021-01-16: 200 mg via ORAL
  Filled 2021-01-16: qty 1

## 2021-01-16 MED ORDER — PROMETHAZINE HCL 25 MG PO TABS: 25.0000 mg | ORAL_TABLET | Freq: Once | ORAL | Status: DC

## 2021-01-16 MED ORDER — OXYCODONE-ACETAMINOPHEN 5-325 MG PO TABS
1.0000 | ORAL_TABLET | ORAL | Status: DC
  Administered 2021-01-16 (×2): 1 via ORAL
  Filled 2021-01-16 (×2): qty 1

## 2021-01-16 NOTE — Discharge Summary (Signed)
Physician Discharge Summary  Martin [name]:[id number] DOB: 02-Mar-1991 DOA: 01/13/2021  PCP: Martin Dew, [name]  Admit date: 01/13/2021  Discharge date: 01/16/2021  Discharge Diagnoses:  Principal Problem:   Sickle cell pain crisis ([place]) Active Problems:   Generalized anxiety disorder   Human immunodeficiency virus (HIV) disease ([place])   Discharge Condition: Stable  Disposition:  Pt is discharged home in good condition and is to follow up with Martin Dew, [name] this week to have labs evaluated. [place] is instructed to increase activity slowly and balance with rest for the next few days, and use prescribed medication to complete treatment of pain  Diet: Regular Wt Readings from Last 3 Encounters:  01/13/21 68 kg  11/19/20 64.9 kg  09/03/20 69.4 kg    History of present illness:  Martin [name] is a 30 year old male with a medical history significant for sickle cell disease, HIV, chronic pain syndrome, opiate dependence and tolerance, generalized anxiety, and history of PE on Eliquis presents with shortness of breath, pain to shoulders, low back, and bilateral hips over the past several days.  Pain is consistent with his typical pain crisis.  He attributes pain crisis to changes in weather, stress, and dehydration.  He states that he has not been eating or drinking very well due to stress..  Patient typically takes Percocet for pain, which is not been effective.  Patient typically wears home oxygen at that time, he has not been using it lately, he states that he has been traveling.   Hospital Course: Sickle cell disease with pain crisis: Patient was admitted for sickle cell pain crisis and managed appropriately with IVF, IV Dilaudid via PCA as well as other adjunct therapies per sickle cell pain management protocols.  Pain intensity improved significantly.  Patient rates his pain as 3/10.  He is requesting discharge home.  He feels that he can manage at  home with current medication regimen.  Patient advised to follow-up in sickle cell clinic in 1 week to repeat CBC and BMP.  He expressed understanding.  Also, patient will follow-up with PCP for medication management.  Patient is on Percocet 10-325 mg every 4 hours as needed for severe breakthrough pain.  Medication was sent to patient's pharmacy #[id number].  Reviewed PDMP also reporting system prior to prescribing opiate medication.  Chronic hypoxia: Patient advised to resume home oxygen.  Patient resides in [place] and home oxygen is managed locally.  History of HIV: Patient to continue antiretroviral therapy and follow-up with infectious disease as an outpatient.   Patient was therefore discharged home today in a hemodynamically stable condition.   Martin [name] will follow-up with PCP within 1 week of this discharge. Martin [name] was counseled extensively about nonpharmacologic means of pain management, patient verbalized understanding and was appreciative of  the care received during this admission.   We discussed the need for good hydration, monitoring of hydration status, avoidance of heat, cold, stress, and infection triggers. We discussed the need to be adherent with taking Hydrea and other home medications. Patient was reminded of the need to seek medical attention immediately if any symptom of bleeding, anemia, or infection occurs.  Discharge Exam: Vitals:   01/16/21 0929 01/16/21 1010  BP: 107/66   Pulse: 90   Resp: 17   Temp: 98.3 F (36.8 C)   SpO2: (!) 87% 90%   Vitals:   01/16/21 0452 01/16/21 0600 01/16/21 0929 01/16/21 1010  BP: 101/61  107/66   Pulse: 91  90  Resp: '14 16 17   ' Temp: 97.8 F (36.6 C)  98.3 F (36.8 C)   TempSrc: Oral  Oral   SpO2: 90% 92% (!) 87% 90%  Weight:      Height:        General appearance : Awake, alert, not in any distress. Speech Clear. Not toxic looking HEENT: Atraumatic and Normocephalic, pupils equally reactive to light and  accomodation Neck: Supple, no JVD. No cervical lymphadenopathy.  Chest: Good air entry bilaterally, no added sounds  CVS: S1 S2 regular, no murmurs.  Abdomen: Bowel sounds present, Non tender and not distended with no gaurding, rigidity or rebound. Extremities: B/L Lower Ext shows no edema, both legs are warm to touch Neurology: Awake alert, and oriented X 3, CN II-XII intact, Non focal Skin: No Rash  Discharge Instructions  Discharge Instructions     Discharge patient   Complete by: As directed    Discharge disposition: 01-Home or Self Care   Discharge patient date: 01/16/2021      Allergies as of 01/16/2021       Reactions   Ketorolac Tromethamine Swelling, Other (See Comments)   Patient reports facial edema and left arm edema after administration.   Tape Rash, Other (See Comments)   PLEASE DO NOT USE THE CLEAR, THICK, "PLASTIC" TAPE- only paper tape is tolerated    Wound Dressing Adhesive Rash        Medication List     TAKE these medications    acetaminophen 325 MG tablet Commonly known as: TYLENOL Take 650 mg by mouth every 6 (six) hours as needed for pain or headache.   ALPRAZolam 1 MG tablet Commonly known as: XANAX Take 1 mg by mouth 3 (three) times daily as needed for anxiety.   cetirizine 10 MG tablet Commonly known as: ZyrTEC Allergy Take 1 tablet (10 mg total) by mouth daily.   Eliquis 5 MG Tabs tablet Generic drug: apixaban Take 5 mg by mouth 2 (two) times daily.   hydroxyurea 500 MG capsule Commonly known as: HYDREA Take 4 capsules (2,000 mg total) by mouth at bedtime.   multivitamin with minerals Tabs tablet Take 1 tablet by mouth daily with breakfast.   naloxone 4 MG/0.1ML Liqd nasal spray kit Commonly known as: NARCAN Place 4 mg into the nose as needed for opioid reversal.   oxyCODONE-acetaminophen 10-325 MG tablet Commonly known as: Percocet Take 1 tablet by mouth every 4 (four) hours as needed for up to 15 days for pain. Start taking  on: January 17, 2021 What changed:  when to take this These instructions start on January 17, 2021. If you are unsure what to do until then, ask your doctor or other care provider.   Triumeq 600-50-300 MG tablet Generic drug: abacavir-dolutegravir-lamiVUDine Take 1 tablet by mouth at bedtime.   Vitamin D (Ergocalciferol) 1.25 MG (50000 UNIT) Caps capsule Commonly known as: DRISDOL Take 1 capsule (50,000 Units total) by mouth every 7 (seven) days. What changed: when to take this        The results of significant diagnostics from this hospitalization (including imaging, microbiology, ancillary and laboratory) are listed below for reference.    Significant Diagnostic Studies: DG Chest Port 1 View  Result Date: 01/14/2021 CLINICAL DATA:  Sickle cell crisis EXAM: PORTABLE CHEST 1 VIEW COMPARISON:  01/13/2021 FINDINGS: Increased interstitial markings, chronic. No focal consolidation. No pleural effusion or pneumothorax. The heart is normal in size. IMPRESSION: No evidence of acute cardiopulmonary disease. Electronically Signed   By:  Julian Hy M.D.   On: 01/14/2021 19:05   DG Chest Port 1 View  Result Date: 01/13/2021 CLINICAL DATA:  30 year old male with hypoxia. Sickle cell disease. HIV. EXAM: PORTABLE CHEST 1 VIEW COMPARISON:  Chest radiographs 08/28/2020 and earlier. FINDINGS: Portable AP semi upright view at 0426 hours. Stable mild cardiomegaly. Other mediastinal contours are within normal limits. Visualized tracheal air column is within normal limits. Stable somewhat large lung volumes. Chronic bilateral increased pulmonary interstitial markings, and some architectural distortion, most pronounced at the right lung base. No superimposed pneumothorax, pulmonary edema, pleural effusion or acute pulmonary opacity. No acute osseous abnormality identified. IMPRESSION: Stable mild cardiomegaly and chronic lung disease. No acute cardiopulmonary abnormality. Electronically Signed   By: Genevie Ann M.D.   On: 01/13/2021 04:36    Microbiology: Recent Results (from the past 240 hour(s))  Resp Panel by RT-PCR (Flu A&B, Covid) Nasopharyngeal Swab     Status: None   Collection Time: 01/13/21  4:10 AM   Specimen: Nasopharyngeal Swab; Nasopharyngeal(NP) swabs in vial transport medium  Result Value Ref Range Status   SARS Coronavirus 2 by RT PCR NEGATIVE NEGATIVE Final    Comment: (NOTE) SARS-CoV-2 target nucleic acids are NOT DETECTED.  The SARS-CoV-2 RNA is generally detectable in upper respiratory specimens during the acute phase of infection. The lowest concentration of SARS-CoV-2 viral copies this assay can detect is 138 copies/mL. A negative result does not preclude SARS-Cov-2 infection and should not be used as the sole basis for treatment or other patient management decisions. A negative result may occur with  improper specimen collection/handling, submission of specimen other than nasopharyngeal swab, presence of viral mutation(s) within the areas targeted by this assay, and inadequate number of viral copies(<138 copies/mL). A negative result must be combined with clinical observations, patient history, and epidemiological information. The expected result is Negative.  Fact Sheet for Patients:  EntrepreneurPulse.com.au  Fact Sheet for Healthcare Providers:  IncredibleEmployment.be  This test is no t yet approved or cleared by the [place] FDA and  has been authorized for detection and/or diagnosis of SARS-CoV-2 by FDA under an Emergency Use Authorization (EUA). This EUA will remain  in effect (meaning this test can be used) for the duration of the COVID-19 declaration under Section 564(b)(1) of the Act, 21 U.S.C.section 360bbb-3(b)(1), unless the authorization is terminated  or revoked sooner.       Influenza A by PCR NEGATIVE NEGATIVE Final   Influenza B by PCR NEGATIVE NEGATIVE Final    Comment: (NOTE) The Xpert Xpress  SARS-CoV-2/FLU/RSV plus assay is intended as an aid in the diagnosis of influenza from Nasopharyngeal swab specimens and should not be used as a sole basis for treatment. Nasal washings and aspirates are unacceptable for Xpert Xpress SARS-CoV-2/FLU/RSV testing.  Fact Sheet for Patients: EntrepreneurPulse.com.au  Fact Sheet for Healthcare Providers: IncredibleEmployment.be  This test is not yet approved or cleared by the [place] FDA and has been authorized for detection and/or diagnosis of SARS-CoV-2 by FDA under an Emergency Use Authorization (EUA). This EUA will remain in effect (meaning this test can be used) for the duration of the COVID-19 declaration under Section 564(b)(1) of the Act, 21 U.S.C. section 360bbb-3(b)(1), unless the authorization is terminated or revoked.  Performed at [facility], [place] [address], [place], [place] [address]      Labs: Basic Metabolic Panel: Recent Labs  Lab 01/13/21 0408  NA 134*  K 4.9  CL 103  CO2 23  GLUCOSE  103*  BUN 11  CREATININE 0.80  CALCIUM 8.7*   Liver Function Tests: Recent Labs  Lab 01/13/21 0408 01/16/21 0946  AST 74* 46*  ALT 28 19  ALKPHOS 61 54  BILITOT 11.1* 8.1*  PROT 8.2* 8.8*  ALBUMIN 3.7 3.7   No results for input(s): LIPASE, AMYLASE in the last 168 hours. No results for input(s): AMMONIA in the last 168 hours. CBC: Recent Labs  Lab 01/13/21 0408 01/14/21 0554 01/15/21 0651 01/16/21 0946  WBC 15.8* 11.3* 9.5 11.4*  NEUTROABS 7.6  --   --   --   HGB 8.1* 8.0* 7.7* 6.6*  HCT 21.3* 21.6* 19.9* 16.6*  MCV 100.0 102.4* 98.5 94.9  PLT 328 305 259 234   Cardiac Enzymes: No results for input(s): CKTOTAL, CKMB, CKMBINDEX, TROPONINI in the last 168 hours. BNP: Invalid input(s): POCBNP CBG: No results for input(s): GLUCAP in the last 168 hours.  Time coordinating discharge: 30 minutes  Signed: Donia Pounds  APRN, [name],  [name] [facility] [address] [place], [place] [address] [phone]  [facility] 01/16/2021, 12:49 PM

## 2021-01-21 ENCOUNTER — Telehealth: Payer: Self-pay

## 2021-01-21 NOTE — Telephone Encounter (Signed)
Transition Care Management Unsuccessful Follow-up Telephone Call  Date of discharge and from where:  01/16/21 from [facility]  Attempts:  1st Attempt  Reason for unsuccessful TCM follow-up call:  Patient reports not a good time to talk at this time     Kathyrn Sheriff, RN, [name], BSN, CCM [facility] Care Management Coordinator [phone]

## 2021-01-22 ENCOUNTER — Telehealth: Payer: Self-pay

## 2021-01-22 NOTE — Telephone Encounter (Signed)
Transition Care Management Follow-up Telephone Call Date of discharge and from where: 01/16/21 from [facility] How have you been since you were released from the hospital? I've doing pretty good. Any questions or concerns? No  Items Reviewed: Did the pt receive and understand the discharge instructions provided? Yes  Medications obtained and verified? Yes  Other? No  Any new allergies since your discharge? No  Dietary orders reviewed? Yes Do you have support at home? Yes   Home Care and Equipment/Supplies: Were home health services ordered? no If so, what is the name of the agency? Not applicable  Has the agency set up a time to come to the patient's home? not applicable Were any new equipment or medical supplies ordered?  No What is the name of the medical supply agency? Not applicable Were you able to get the supplies/equipment? not applicable Do you have any questions related to the use of the equipment or supplies? No  Functional Questionnaire: (I = Independent and D = Dependent) ADLs: I  Bathing/Dressing- I  Meal Prep- I  Eating- I  Maintaining continence- I  Transferring/Ambulation- I  Managing Meds- I  Follow up appointments reviewed:  PCP Hospital f/u appt confirmed? Yes   Per patient, provider stated can keep the appointment: Scheduled to see Julianne Handler, [name] on 02/25/21 @ 10:20am. Specialist [facility] f/u appt confirmed?  Not applicable Are transportation arrangements needed? No  If their condition worsens, is the pt aware to call PCP or go to the Emergency Dept.? Yes Was the patient provided with contact information for the PCP's office or ED? Yes Was to pt encouraged to call back with questions or concerns? Yes    Kathyrn Sheriff, RN, [name], BSN, CCM [facility] Care Management Coordinator [phone]

## 2021-01-27 ENCOUNTER — Inpatient Hospital Stay
Admit: 2021-01-27 | Discharge: 2021-01-27 | Disposition: A | Discharge status: Home / Self Care | Attending: Emergency Medicine

## 2021-01-27 DIAGNOSIS — D5700 Hb-SS disease with crisis, unspecified: Secondary | ICD-10-CM

## 2021-01-27 LAB — CBC WITH AUTO DIFFERENTIAL
Basophils %: 0 % (ref 0–2)
Basophils Absolute: 0 10*3/uL (ref 0.0–0.1)
Eosinophils %: 2 % (ref 0–5)
Eosinophils Absolute: 0.4 10*3/uL (ref 0.0–0.4)
Granulocyte Absolute Count: 0 10*3/uL
Hematocrit: 18.7 % — ABNORMAL LOW (ref 36.0–48.0)
Hemoglobin: 7 g/dL — ABNORMAL LOW (ref 13.0–16.0)
Immature Granulocytes: 0 %
Lymphocytes %: 50 % (ref 21–52)
Lymphocytes Absolute: 10.5 10*3/uL — ABNORMAL HIGH (ref 0.9–3.6)
MCH: 37.4 PG — ABNORMAL HIGH (ref 24.0–34.0)
MCHC: 37.4 g/dL — ABNORMAL HIGH (ref 31.0–37.0)
MCV: 100 FL (ref 78.0–100.0)
MPV: 10.2 FL (ref 9.2–11.8)
Monocytes %: 6 % (ref 3–10)
Monocytes Absolute: 1.2 10*3/uL (ref 0.05–1.2)
NRBC Absolute: 0.28 10*3/uL — ABNORMAL HIGH (ref 0.00–0.01)
Neutrophils %: 42 % (ref 40–73)
Neutrophils Absolute: 8.7 10*3/uL — ABNORMAL HIGH (ref 1.8–8.0)
Nucleated RBCs: 1.3 PER 100 WBC — ABNORMAL HIGH
Platelet Comment: ADEQUATE
Platelets: 252 10*3/uL (ref 135–420)
RBC: 1.87 M/uL — ABNORMAL LOW (ref 4.35–5.65)
RDW: 24.3 % — ABNORMAL HIGH (ref 11.6–14.5)
WBC: 20.8 10*3/uL — ABNORMAL HIGH (ref 4.6–13.2)

## 2021-01-27 LAB — BASIC METABOLIC PANEL
Anion Gap: 9 mmol/L (ref 3.0–18)
BUN: 16 MG/DL (ref 7.0–18)
Bun/Cre Ratio: 16 (ref 12–20)
CO2: 22 mmol/L (ref 21–32)
Calcium: 8.5 MG/DL (ref 8.5–10.1)
Chloride: 106 mmol/L (ref 100–111)
Creatinine: 0.99 MG/DL (ref 0.6–1.3)
ESTIMATED GLOMERULAR FILTRATION RATE: >60 mL/min/{1.73_m2} (ref >60)
Glucose: 95 mg/dL (ref 74–99)
Potassium: 4.5 mmol/L (ref 3.5–5.5)
Sodium: 137 mmol/L (ref 136–145)

## 2021-01-27 LAB — POC VENOUS BLOOD GAS
PH, VENOUS (POC): 7.41 (ref 7.32–7.42)
pH, venous (POC): 7.41 (ref 7.32–7.42)

## 2021-01-27 LAB — TROPONIN, HIGH SENSITIVITY: Troponin, High Sensitivity: 7 ng/L (ref 0–78)

## 2021-01-27 LAB — GLUCOSE, POC, BEDSIDE: Glucose, POC: 89 MG/DL (ref 74–106)

## 2021-01-27 LAB — POCT LACTIC ACID: POC Lactic Acid: 0.75 mmol/L (ref 0.40–2.00)

## 2021-01-27 LAB — RETICULOCYTE COUNT
Retic Ct Pct: 4.1 % — ABNORMAL HIGH (ref 0.5–2.5)
Reticulocyte count: 4.1 % — ABNORMAL HIGH (ref 0.5–2.5)

## 2021-01-27 LAB — POC TROPONIN-I
POC Troponin I: <0.04 ng/mL (ref 0.00–0.08)
Troponin-I (POC): <0.04 ng/mL (ref 0.00–0.08)

## 2021-01-27 LAB — CBC WITH AUTOMATED DIFF
ABS. BASOPHILS: 0 10*3/uL (ref 0.0–0.1)
ABS. EOSINOPHILS: 0.4 10*3/uL (ref 0.0–0.4)
ABS. IMM. GRANS.: 0 10*3/uL
ABS. LYMPHOCYTES: 10.5 10*3/uL — ABNORMAL HIGH (ref 0.9–3.6)
ABS. MONOCYTES: 1.2 10*3/uL (ref 0.05–1.2)
ABS. NEUTROPHILS: 8.7 10*3/uL — ABNORMAL HIGH (ref 1.8–8.0)
ABSOLUTE NRBC: 0.28 10*3/uL — ABNORMAL HIGH (ref 0.00–0.01)
BASOPHILS: 0 % (ref 0–2)
EOSINOPHILS: 2 % (ref 0–5)
HCT: 18.7 % — ABNORMAL LOW (ref 36.0–48.0)
HGB: 7 g/dL — ABNORMAL LOW (ref 13.0–16.0)
IMMATURE GRANULOCYTES: 0 %
LYMPHOCYTES: 50 % (ref 21–52)
MCH: 37.4 PG — ABNORMAL HIGH (ref 24.0–34.0)
MCHC: 37.4 g/dL — ABNORMAL HIGH (ref 31.0–37.0)
MCV: 100 FL (ref 78.0–100.0)
MONOCYTES: 6 % (ref 3–10)
MPV: 10.2 FL (ref 9.2–11.8)
NEUTROPHILS: 42 % (ref 40–73)
NRBC: 1.3 PER 100 WBC — ABNORMAL HIGH
PLATELET COMMENTS: ADEQUATE
PLATELET: 252 10*3/uL (ref 135–420)
RBC: 1.87 M/uL — ABNORMAL LOW (ref 4.35–5.65)
RDW: 24.3 % — ABNORMAL HIGH (ref 11.6–14.5)
WBC: 20.8 10*3/uL — ABNORMAL HIGH (ref 4.6–13.2)

## 2021-01-27 LAB — METABOLIC PANEL, BASIC
Anion gap: 9 mmol/L (ref 3.0–18)
BUN/Creatinine ratio: 16 (ref 12–20)
BUN: 16 MG/DL (ref 7.0–18)
CO2: 22 mmol/L (ref 21–32)
Calcium: 8.5 MG/DL (ref 8.5–10.1)
Chloride: 106 mmol/L (ref 100–111)
Creatinine: 0.99 MG/DL (ref 0.6–1.3)
Glucose: 95 mg/dL (ref 74–99)
Potassium: 4.5 mmol/L (ref 3.5–5.5)
Sodium: 137 mmol/L (ref 136–145)
eGFR: >60 mL/min/{1.73_m2} (ref >60)

## 2021-01-27 LAB — GLUCOSE, POC: Glucose, POC: 89 MG/DL (ref 74–106)

## 2021-01-27 LAB — TROPONIN-HIGH SENSITIVITY: Troponin-High Sensitivity: 7 ng/L (ref 0–78)

## 2021-01-27 LAB — POC LACTIC ACID: Lactic Acid (POC): 0.75 mmol/L (ref 0.40–2.00)

## 2021-01-27 MED ORDER — DIPHENHYDRAMINE HCL 50 MG/ML IJ SOLN
50 mg/mL | INTRAMUSCULAR | Status: AC
Start: 2021-01-27 — End: 2021-01-27
  Administered 2021-01-27: 09:00:00 via INTRAVENOUS

## 2021-01-27 MED ORDER — HYDROMORPHONE 1 MG/ML INJECTION SOLUTION
1 mg/mL | INTRAMUSCULAR | Status: AC
Start: 2021-01-27 — End: 2021-01-27
  Administered 2021-01-27: 07:00:00 via INTRAVENOUS

## 2021-01-27 MED ORDER — ONDANSETRON (PF) 4 MG/2 ML INJECTION
4 mg/2 mL | INTRAMUSCULAR | Status: AC
Start: 2021-01-27 — End: 2021-01-27
  Administered 2021-01-27: 07:00:00 via INTRAVENOUS

## 2021-01-27 MED ORDER — SODIUM CHLORIDE 0.9 % IV
Freq: Once | INTRAVENOUS | Status: AC
Start: 2021-01-27 — End: 2021-01-27
  Administered 2021-01-27: 07:00:00 via INTRAVENOUS

## 2021-01-27 MED ORDER — DIPHENHYDRAMINE HCL 50 MG/ML IJ SOLN
50 mg/mL | INTRAMUSCULAR | Status: AC
Start: 2021-01-27 — End: 2021-01-27
  Administered 2021-01-27: 07:00:00 via INTRAVENOUS

## 2021-01-27 MED ORDER — HYDROMORPHONE 0.5 MG/0.5 ML SYRINGE
0.5 mg/ mL | Freq: Once | INTRAMUSCULAR | Status: AC
Start: 2021-01-27 — End: 2021-01-27
  Administered 2021-01-27: 09:00:00 via INTRAVENOUS

## 2021-01-27 MED ORDER — ONDANSETRON (PF) 4 MG/2 ML INJECTION
4 mg/2 mL | INTRAMUSCULAR | Status: AC
Start: 2021-01-27 — End: 2021-01-27
  Administered 2021-01-27: 09:00:00 via INTRAVENOUS

## 2021-01-27 MED FILL — DIPHENHYDRAMINE HCL 50 MG/ML IJ SOLN: 50 mg/mL | INTRAMUSCULAR | Qty: 0.25

## 2021-01-27 MED FILL — SODIUM CHLORIDE 0.9 % IV: INTRAVENOUS | Qty: 500

## 2021-01-27 MED FILL — DIPHENHYDRAMINE HCL 50 MG/ML IJ SOLN: 50 mg/mL | INTRAMUSCULAR | Qty: 1

## 2021-01-27 MED FILL — ONDANSETRON (PF) 4 MG/2 ML INJECTION: 4 mg/2 mL | INTRAMUSCULAR | Qty: 2

## 2021-01-27 MED FILL — HYDROMORPHONE (PF) 1 MG/ML IJ SOLN: 1 mg/mL | INTRAMUSCULAR | Qty: 1

## 2021-01-27 MED FILL — HYDROMORPHONE 0.5 MG/0.5 ML SYRINGE: 0.5 mg/ mL | INTRAMUSCULAR | Qty: 0.5

## 2021-01-27 NOTE — ED Notes (Signed)
ED Notes  by Hunt Oris, RN at 01/27/21 9604                Author: Hunt Oris, RN  Service: --  Author Type: Registered Nurse       Filed: 01/27/21 0526  Date of Service: 01/27/21 0526  Status: Signed          Editor: Escutin, Rhoderick, RN (Registered Nurse)               I have reviewed discharge instructions with the patient.  The patient verbalized understanding.    Patient armband removed and given to patient to take home.  Patient was informed of the privacy risks if armband lost or stolen     Current Discharge Medication List

## 2021-01-27 NOTE — ED Notes (Signed)
ED Triage Notes by Selinda Eon, RN at 01/27/21 0205                Author: Selinda Eon, RN  Service: Emergency Medicine  Author Type: Registered Nurse       Filed: 01/27/21 0205  Date of Service: 01/27/21 0205  Status: Signed          Editor: Selinda Eon, RN (Registered Nurse)               A&O male with c/o sickle cell crisis. Reports pain all over that began yesterday. No relief from home meds.

## 2021-01-27 NOTE — ED Provider Notes (Signed)
Pt c/o sickle cell crisis, says gets the same freq.  On percocet 10 mg but not helping.  H/o hiv also, says compliant w all meds.    Says wears o2 2 l due to sickle cell disease, also on eliquis for h/o dvt/pe  on curr cp, no sob. No abd pain. No n/v.  C/o low back pain. Says is typical attack for pt, low back pain  no other pain.  No weakness.        Past Medical History:   Diagnosis Date    HIV (human immunodeficiency virus infection) ([place])     Sickle cell anemia ([place])        History reviewed. No pertinent surgical history.      History reviewed. No pertinent family history.    Social History     Socioeconomic History    Marital status: SINGLE     Spouse name: Not on file    Number of children: Not on file    Years of education: Not on file    Highest education level: Not on file   Occupational History    Not on file   Tobacco Use    Smoking status: Never    Smokeless tobacco: Never   Substance and Sexual Activity    Alcohol use: Never    Drug use: Yes     Types: Marijuana    Sexual activity: Not on file   Other Topics Concern    Not on file   Social History Narrative    Not on file     Social Determinants of Health     Financial Resource Strain: Not on file   Food Insecurity: Not on file   Transportation Needs: Not on file   Physical Activity: Not on file   Stress: Not on file   Social Connections: Not on file   Intimate Partner Violence: Not on file   Housing Stability: Not on file         ALLERGIES: Toradol [ketorolac]    Review of Systems   Constitutional:  Negative for diaphoresis and fever.   HENT:  Negative for congestion.    Respiratory:  Negative for cough and shortness of breath.    Cardiovascular:  Negative for chest pain.   Gastrointestinal:  Negative for abdominal pain and nausea.   Musculoskeletal:  Positive for back pain.   Skin:  Negative for rash.   Neurological:  Negative for dizziness.   All other systems reviewed and are negative.    Vitals:    01/27/21 0202 01/27/21 0415   BP: 117/76 121/69    Pulse: (!) 103 84   Resp: 18 19   Temp: 99.3 ??F (37.4 ??C) 99 ??F (37.2 ??C)   SpO2: 91% 91%   Weight: 68 kg (150 lb)    Height: '6\' 3"'  (1.905 m)             Physical Exam  Vitals and nursing note reviewed.   Constitutional:       Appearance: He is well-developed.   HENT:      Head: Normocephalic and atraumatic.   Eyes:      Conjunctiva/sclera: Conjunctivae normal.   Cardiovascular:      Rate and Rhythm: Normal rate and regular rhythm.   Pulmonary:      Effort: Pulmonary effort is normal.      Breath sounds: No wheezing.   Abdominal:      Palpations: Abdomen is soft.      Tenderness: There  is no abdominal tenderness.   Musculoskeletal:         General: No tenderness.      Cervical back: Normal range of motion.   Skin:     General: Skin is warm and dry.      Capillary Refill: Capillary refill takes less than 2 seconds.      Findings: No rash.   Neurological:      Mental Status: He is alert and oriented to person, place, and time.   Psychiatric:         Mood and Affect: Mood normal.        MDM         Procedures    Vitals:  Patient Vitals for the past 12 hrs:   Temp Pulse Resp BP SpO2   01/27/21 0415 99 ??F (37.2 ??C) 84 19 121/69 91 %   01/27/21 0202 99.3 ??F (37.4 ??C) (!) 103 18 117/76 91 %         Medications ordered:   Medications   HYDROmorphone (DILAUDID) injection 1 mg (1 mg IntraVENous Given 01/27/21 0250)   ondansetron (ZOFRAN) injection 4 mg (4 mg IntraVENous Given 01/27/21 0250)   diphenhydrAMINE (BENADRYL) injection 12.5 mg (12.5 mg IntraVENous Given 01/27/21 0250)   0.9% sodium chloride infusion 500 mL (0 mL IntraVENous IV Completed 01/27/21 0429)   HYDROmorphone (DILAUDID) syringe 0.5 mg (0.5 mg IntraVENous Given 01/27/21 0453)   ondansetron (ZOFRAN) injection 4 mg (4 mg IntraVENous Given 01/27/21 0454)   diphenhydrAMINE (BENADRYL) injection 12.5 mg (12.5 mg IntraVENous Given 01/27/21 0453)         Lab findings:  Recent Results (from the past 12 hour(s))   RETICULOCYTE COUNT    Collection Time: 01/27/21  2:45  AM   Result Value Ref Range    Reticulocyte count 4.1 (H) 0.5 - 2.5 %   METABOLIC PANEL, BASIC    Collection Time: 01/27/21  2:45 AM   Result Value Ref Range    Sodium 137 136 - 145 mmol/L    Potassium 4.5 3.5 - 5.5 mmol/L    Chloride 106 100 - 111 mmol/L    CO2 22 21 - 32 mmol/L    Anion gap 9 3.0 - 18 mmol/L    Glucose 95 74 - 99 mg/dL    BUN 16 7.0 - 18 MG/DL    Creatinine 0.99 0.6 - 1.3 MG/DL    BUN/Creatinine ratio 16 12 - 20      eGFR >60 >60 ml/min/1.88m    Calcium 8.5 8.5 - 10.1 MG/DL   TROPONIN-HIGH SENSITIVITY    Collection Time: 01/27/21  2:45 AM   Result Value Ref Range    Troponin-High Sensitivity 7 0 - 78 ng/L   GLUCOSE, POC    Collection Time: 01/27/21  3:00 AM   Result Value Ref Range    Glucose, POC 89 74 - 106 MG/DL    Performed by DOwens Shark   POC TROPONIN-I    Collection Time: 01/27/21  3:00 AM   Result Value Ref Range    Troponin-I (POC) <0.04 0.00 - 0.08 ng/mL   POC VENOUS BLOOD GAS    Collection Time: 01/27/21  3:10 AM   Result Value Ref Range    pH, venous (POC) 7.41 7.32 - 7.42      Specimen type (POC) VENOUS BLOOD      Performed by DOwens Shark   CBC WITH AUTOMATED DIFF    Collection Time: 01/27/21  3:30 AM  Result Value Ref Range    WBC 20.8 (H) 4.6 - 13.2 K/uL    RBC 1.87 (L) 4.35 - 5.65 M/uL    HGB 7.0 (L) 13.0 - 16.0 g/dL    HCT 18.7 (L) 36.0 - 48.0 %    MCV 100.0 78.0 - 100.0 FL    MCH 37.4 (H) 24.0 - 34.0 PG    MCHC 37.4 (H) 31.0 - 37.0 g/dL    RDW 24.3 (H) 11.6 - 14.5 %    PLATELET 252 135 - 420 K/uL    MPV 10.2 9.2 - 11.8 FL    NRBC 1.3 (H) 0 PER 100 WBC    ABSOLUTE NRBC 0.28 (H) 0.00 - 0.01 K/uL    NEUTROPHILS 42 40 - 73 %    LYMPHOCYTES 50 21 - 52 %    MONOCYTES 6 3 - 10 %    EOSINOPHILS 2 0 - 5 %    BASOPHILS 0 0 - 2 %    IMMATURE GRANULOCYTES 0 %    ABS. NEUTROPHILS 8.7 (H) 1.8 - 8.0 K/UL    ABS. LYMPHOCYTES 10.5 (H) 0.9 - 3.6 K/UL    ABS. MONOCYTES 1.2 0.05 - 1.2 K/UL    ABS. EOSINOPHILS 0.4 0.0 - 0.4 K/UL    ABS. BASOPHILS 0.0 0.0 - 0.1 K/UL    ABS. IMM. GRANS. 0.0  K/UL    DF MANUAL      PLATELET COMMENTS ADEQUATE PLATELETS      RBC COMMENTS POLYCHROMASIA  4+        RBC COMMENTS SCHISTOCYTES  1+        RBC COMMENTS SICKLE CELLS  4+        RBC COMMENTS TARGET CELLS  2+       POC LACTIC ACID    Collection Time: 01/27/21  3:37 AM   Result Value Ref Range    Lactic Acid (POC) 0.75 0.40 - 2.00 mmol/L           X-Ray, CT or other radiology findings or impressions:  No orders to display             Progress notes, Consult notes or additional Procedure notes:   4:36 AM improved, mild pain only .  5:22 AM pt feels much better, a and o x3, no complaints, wants dc to dc per pt req. Leukocytosis but afebrile, nl lactate, no emc.  Not c/w sepsis.  Anemic but at pt baseline per him and care everywhere . No abd pain, no new pain from baseline.  Neuro intact. Not c/w cauda equin/fx/abscess/cord compression/acute abd.       Diagnosis:   1. Sickle cell crisis ([place])    2. Bilateral low back pain, unspecified chronicity, unspecified whether sciatica present        Disposition: home    Follow-up Information       Follow up With Specialties Details [facility] Emergency Medicine Go to  As needed, If symptoms worsen [place] [address]  [phone]    [facility]  Schedule an appointment as soon as possible for a visit in 2 days or your family physician [address]  [place]  [phone]             Patient's Medications   Start Taking    No medications on file   Continue Taking    ABACAVIR-DOLUTEGRAVIR-LAMIVUDINE ([place]) TABLET  Take 1 Tablet by mouth nightly.    APIXABAN (ELIQUIS) 5 MG TABLET    Take 5 mg by mouth two (2) times a day.    HYDROXYUREA (HYDREA) 500 MG CAPSULE    Take 2,000 mg by mouth.    OXYCODONE-ACETAMINOPHEN (PERCOCET 10) 10-325 MG PER TABLET    Take 1 Tablet by mouth.   These Medications have changed    No medications on file   Stop Taking    No medications on file

## 2021-01-27 NOTE — ED Notes (Signed)
ED Notes  by Hunt Oris, RN at 01/27/21 4915                Author: Hunt Oris, RN  Service: --  Author Type: Registered Nurse       Filed: 01/27/21 0429  Date of Service: 01/27/21 0429  Status: Signed          Editor: Escutin, Rhoderick, RN (Registered Nurse)               Pt comfortably lying in bed, not in any acute distress, vital signs remain stable, will continue to monitor

## 2021-01-30 ENCOUNTER — Other Ambulatory Visit: Payer: Self-pay | Admitting: Family Medicine

## 2021-01-30 ENCOUNTER — Telehealth: Payer: Self-pay

## 2021-01-30 DIAGNOSIS — D571 Sickle-cell disease without crisis: Secondary | ICD-10-CM

## 2021-01-30 DIAGNOSIS — F1190 Opioid use, unspecified, uncomplicated: Secondary | ICD-10-CM

## 2021-01-30 MED ORDER — OXYCODONE-ACETAMINOPHEN 10-325 MG PO TABS: 1.0000 | ORAL_TABLET | ORAL | 0 refills | Status: DC | PRN

## 2021-01-30 NOTE — Telephone Encounter (Signed)
Oxycodone  [employer]  [phone] [place]

## 2021-01-30 NOTE — Progress Notes (Signed)
Reviewed PDMP substance reporting system prior to prescribing opiate medications. No inconsistencies noted.   Meds ordered this encounter  Medications  . oxyCODONE-acetaminophen (PERCOCET) 10-325 MG tablet    Sig: Take 1 tablet by mouth every 4 (four) hours as needed for up to 15 days for pain.    Dispense:  90 tablet    Refill:  0    Order Specific Question:   Supervising Provider    Answer:   JEGEDE, OLUGBEMIGA E [[id number]]    Martin [name] Halynn Reitano  APRN, [name], [name] [facility] [address]  [place], [place] [address] [phone]  

## 2021-02-13 ENCOUNTER — Telehealth: Payer: Self-pay

## 2021-02-13 NOTE — Telephone Encounter (Signed)
Oxycodone  °

## 2021-02-14 ENCOUNTER — Other Ambulatory Visit: Payer: Self-pay | Admitting: Family Medicine

## 2021-02-14 DIAGNOSIS — F1190 Opioid use, unspecified, uncomplicated: Secondary | ICD-10-CM

## 2021-02-14 DIAGNOSIS — D571 Sickle-cell disease without crisis: Secondary | ICD-10-CM

## 2021-02-14 MED ORDER — OXYCODONE-ACETAMINOPHEN 10-325 MG PO TABS
1.0000 | ORAL_TABLET | ORAL | 0 refills | Status: AC | PRN
Start: 2021-02-14 — End: 2021-03-01

## 2021-02-14 NOTE — Progress Notes (Signed)
Reviewed PDMP substance reporting system prior to prescribing opiate medications. No inconsistencies noted.   Meds ordered this encounter  Medications  . oxyCODONE-acetaminophen (PERCOCET) 10-325 MG tablet    Sig: Take 1 tablet by mouth every 4 (four) hours as needed for up to 15 days for pain.    Dispense:  90 tablet    Refill:  0    Order Specific Question:   Supervising Provider    Answer:   JEGEDE, OLUGBEMIGA E [[id number]]    Martin Buresh Moore Ysabel Cowgill  APRN, [name], [name] [facility] [address]  [place], [place] [address] [phone]  

## 2021-02-19 ENCOUNTER — Telehealth (HOSPITAL_COMMUNITY): Payer: Self-pay

## 2021-02-19 NOTE — Telephone Encounter (Signed)
Pt called to be seen in the day hospital, informed pt that center is unable to treat sickle cell patients today due to low staffing and directed pt to ED for care today, pt verbalized understanding.

## 2021-02-25 ENCOUNTER — Ambulatory Visit: Payer: Medicaid Other | Admitting: Family Medicine

## 2021-02-27 ENCOUNTER — Telehealth: Payer: Self-pay

## 2021-02-27 NOTE — Telephone Encounter (Signed)
Oxycodone  Needs PA 

## 2021-03-03 ENCOUNTER — Telehealth: Payer: Self-pay

## 2021-03-03 NOTE — Telephone Encounter (Signed)
Oxycodone refill

## 2021-03-04 ENCOUNTER — Other Ambulatory Visit: Payer: Self-pay | Admitting: Family Medicine

## 2021-03-04 DIAGNOSIS — G8929 Other chronic pain: Secondary | ICD-10-CM

## 2021-03-04 MED ORDER — OXYCODONE-ACETAMINOPHEN 10-325 MG PO TABS: 1.0000 | ORAL_TABLET | Freq: Four times a day (QID) | ORAL | 0 refills | Status: DC | PRN

## 2021-03-04 NOTE — Progress Notes (Signed)
Reviewed PDMP substance reporting system prior to prescribing opiate medications. No inconsistencies noted.  Meds ordered this encounter  Medications   oxyCODONE-acetaminophen (PERCOCET) 10-325 MG tablet    Sig: Take 1 tablet by mouth every 6 (six) hours as needed for pain.    Dispense:  90 tablet    Refill:  0    Order Specific Question:   Supervising Provider    Answer:   JEGEDE, OLUGBEMIGA E [[id number]]   Arletha Marschke Moore Karyl Sharrar  APRN, [name], [name] [facility] [address]  [place], [place] [address] [phone]  

## 2021-03-10 ENCOUNTER — Emergency Department (HOSPITAL_COMMUNITY): Payer: Medicaid Other

## 2021-03-10 ENCOUNTER — Inpatient Hospital Stay (HOSPITAL_COMMUNITY)
Admission: EM | Admit: 2021-03-10 | Discharge: 2021-03-11 | DRG: 812 | Disposition: A | Discharge status: Home / Self Care | Payer: Medicaid Other | Attending: Internal Medicine | Admitting: Internal Medicine

## 2021-03-10 ENCOUNTER — Other Ambulatory Visit: Payer: Self-pay

## 2021-03-10 ENCOUNTER — Encounter (HOSPITAL_COMMUNITY): Payer: Self-pay

## 2021-03-10 DIAGNOSIS — D5700 Hb-SS disease with crisis, unspecified: Principal | ICD-10-CM | POA: Diagnosis present

## 2021-03-10 DIAGNOSIS — Z8616 Personal history of COVID-19: Secondary | ICD-10-CM | POA: Diagnosis not present

## 2021-03-10 DIAGNOSIS — D638 Anemia in other chronic diseases classified elsewhere: Secondary | ICD-10-CM | POA: Diagnosis present

## 2021-03-10 DIAGNOSIS — F411 Generalized anxiety disorder: Secondary | ICD-10-CM | POA: Diagnosis present

## 2021-03-10 DIAGNOSIS — B20 Human immunodeficiency virus [HIV] disease: Secondary | ICD-10-CM | POA: Diagnosis present

## 2021-03-10 DIAGNOSIS — D72829 Elevated white blood cell count, unspecified: Secondary | ICD-10-CM | POA: Diagnosis present

## 2021-03-10 DIAGNOSIS — F419 Anxiety disorder, unspecified: Secondary | ICD-10-CM | POA: Diagnosis present

## 2021-03-10 DIAGNOSIS — Z20822 Contact with and (suspected) exposure to covid-19: Secondary | ICD-10-CM | POA: Diagnosis present

## 2021-03-10 DIAGNOSIS — E559 Vitamin D deficiency, unspecified: Secondary | ICD-10-CM | POA: Diagnosis present

## 2021-03-10 DIAGNOSIS — G894 Chronic pain syndrome: Secondary | ICD-10-CM | POA: Diagnosis present

## 2021-03-10 DIAGNOSIS — Z832 Family history of diseases of the blood and blood-forming organs and certain disorders involving the immune mechanism: Secondary | ICD-10-CM

## 2021-03-10 DIAGNOSIS — Z7901 Long term (current) use of anticoagulants: Secondary | ICD-10-CM

## 2021-03-10 DIAGNOSIS — R0902 Hypoxemia: Secondary | ICD-10-CM | POA: Diagnosis present

## 2021-03-10 DIAGNOSIS — F1120 Opioid dependence, uncomplicated: Secondary | ICD-10-CM | POA: Diagnosis present

## 2021-03-10 DIAGNOSIS — Z21 Asymptomatic human immunodeficiency virus [HIV] infection status: Secondary | ICD-10-CM | POA: Diagnosis present

## 2021-03-10 DIAGNOSIS — Z86711 Personal history of pulmonary embolism: Secondary | ICD-10-CM | POA: Diagnosis not present

## 2021-03-10 LAB — CBC WITH DIFFERENTIAL/PLATELET
Abs Immature Granulocytes: 0.05 10*3/uL (ref 0.00–0.07)
Basophils Absolute: 0.1 10*3/uL (ref 0.0–0.1)
Basophils Relative: 1 %
Eosinophils Absolute: 0.6 10*3/uL — ABNORMAL HIGH (ref 0.0–0.5)
Eosinophils Relative: 5 %
HCT: 22.4 % — ABNORMAL LOW (ref 39.0–52.0)
Hemoglobin: 8.2 g/dL — ABNORMAL LOW (ref 13.0–17.0)
Immature Granulocytes: 0 %
Lymphocytes Relative: 59 %
Lymphs Abs: 7.5 10*3/uL — ABNORMAL HIGH (ref 0.7–4.0)
MCH: 40.4 pg — ABNORMAL HIGH (ref 26.0–34.0)
MCHC: 36.6 g/dL — ABNORMAL HIGH (ref 30.0–36.0)
MCV: 110.3 fL — ABNORMAL HIGH (ref 80.0–100.0)
Monocytes Absolute: 0.9 10*3/uL (ref 0.1–1.0)
Monocytes Relative: 7 %
Neutro Abs: 3.5 10*3/uL (ref 1.7–7.7)
Neutrophils Relative %: 28 %
Platelets: 345 10*3/uL (ref 150–400)
RBC: 2.03 MIL/uL — ABNORMAL LOW (ref 4.22–5.81)
RDW: 21.7 % — ABNORMAL HIGH (ref 11.5–15.5)
WBC: 12.7 10*3/uL — ABNORMAL HIGH (ref 4.0–10.5)
nRBC: 32.5 % — ABNORMAL HIGH (ref 0.0–0.2)

## 2021-03-10 LAB — COMPREHENSIVE METABOLIC PANEL
ALT: 18 U/L (ref 0–44)
AST: 35 U/L (ref 15–41)
Albumin: 3.8 g/dL (ref 3.5–5.0)
Alkaline Phosphatase: 62 U/L (ref 38–126)
Anion gap: 7 (ref 5–15)
BUN: 12 mg/dL (ref 6–20)
CO2: 22 mmol/L (ref 22–32)
Calcium: 8.6 mg/dL — ABNORMAL LOW (ref 8.9–10.3)
Chloride: 106 mmol/L (ref 98–111)
Creatinine, Ser: 0.67 mg/dL (ref 0.61–1.24)
GFR, Estimated: >60 mL/min (ref >60)
Glucose, Bld: 78 mg/dL (ref 70–99)
Potassium: 3.4 mmol/L — ABNORMAL LOW (ref 3.5–5.1)
Sodium: 135 mmol/L (ref 135–145)
Total Bilirubin: 6.2 mg/dL — ABNORMAL HIGH (ref 0.3–1.2)
Total Protein: 9.1 g/dL — ABNORMAL HIGH (ref 6.5–8.1)

## 2021-03-10 LAB — RESP PANEL BY RT-PCR (FLU A&B, COVID) ARPGX2
Influenza A by PCR: NEGATIVE
Influenza B by PCR: NEGATIVE
SARS Coronavirus 2 by RT PCR: NEGATIVE

## 2021-03-10 LAB — RETICULOCYTES
Immature Retic Fract: 36.6 % — ABNORMAL HIGH (ref 2.3–15.9)
RBC.: 2.01 MIL/uL — ABNORMAL LOW (ref 4.22–5.81)
Retic Count, Absolute: 183 10*3/uL (ref 19.0–186.0)
Retic Ct Pct: 8.7 % — ABNORMAL HIGH (ref 0.4–3.1)

## 2021-03-10 LAB — TYPE AND SCREEN
ABO/RH(D): O POS
Antibody Screen: NEGATIVE

## 2021-03-10 LAB — TROPONIN I (HIGH SENSITIVITY)
Troponin I (High Sensitivity): 4 ng/L (ref <18)
Troponin I (High Sensitivity): 4 ng/L (ref <18)

## 2021-03-10 MED ORDER — KETOROLAC TROMETHAMINE 15 MG/ML IJ SOLN: 15.0000 mg | Freq: Four times a day (QID) | INTRAMUSCULAR | Status: DC

## 2021-03-10 MED ORDER — DIPHENHYDRAMINE HCL 25 MG PO CAPS
25.0000 mg | ORAL_CAPSULE | ORAL | Status: DC | PRN
  Administered 2021-03-10: 50 mg via ORAL
  Filled 2021-03-10: qty 2

## 2021-03-10 MED ORDER — SODIUM CHLORIDE 0.9% FLUSH: 9.0000 mL | INTRAVENOUS | Status: DC | PRN

## 2021-03-10 MED ORDER — APIXABAN 5 MG PO TABS
5.0000 mg | ORAL_TABLET | Freq: Two times a day (BID) | ORAL | Status: DC
  Administered 2021-03-10 – 2021-03-11 (×3): 5 mg via ORAL
  Filled 2021-03-10 (×3): qty 1

## 2021-03-10 MED ORDER — DIPHENHYDRAMINE HCL 25 MG PO CAPS: 25.0000 mg | ORAL_CAPSULE | ORAL | Status: DC | PRN

## 2021-03-10 MED ORDER — NALOXONE HCL 0.4 MG/ML IJ SOLN: 0.4000 mg | INTRAMUSCULAR | Status: DC | PRN

## 2021-03-10 MED ORDER — HYDROMORPHONE HCL 2 MG/ML IJ SOLN
2.0000 mg | INTRAMUSCULAR | Status: AC
  Administered 2021-03-10: 2 mg via INTRAVENOUS
  Filled 2021-03-10: qty 1

## 2021-03-10 MED ORDER — SODIUM CHLORIDE 0.45 % IV SOLN: INTRAVENOUS | Status: DC

## 2021-03-10 MED ORDER — ADULT MULTIVITAMIN W/MINERALS CH
1.0000 | ORAL_TABLET | Freq: Every day | ORAL | Status: DC
  Administered 2021-03-11: 1 via ORAL
  Filled 2021-03-10 (×2): qty 1

## 2021-03-10 MED ORDER — ONDANSETRON HCL 4 MG/2ML IJ SOLN: 4.0000 mg | INTRAMUSCULAR | Status: DC | PRN

## 2021-03-10 MED ORDER — HYDROMORPHONE HCL 1 MG/ML IJ SOLN
1.0000 mg | INTRAMUSCULAR | Status: DC | PRN
  Administered 2021-03-10: 1 mg via INTRAVENOUS
  Filled 2021-03-10: qty 1

## 2021-03-10 MED ORDER — BISACODYL 5 MG PO TBEC
5.0000 mg | DELAYED_RELEASE_TABLET | Freq: Once | ORAL | Status: AC
  Administered 2021-03-10: 5 mg via ORAL
  Filled 2021-03-10: qty 1

## 2021-03-10 MED ORDER — ALPRAZOLAM 1 MG PO TABS: 1.0000 mg | ORAL_TABLET | Freq: Three times a day (TID) | ORAL | Status: DC | PRN

## 2021-03-10 MED ORDER — ONDANSETRON HCL 4 MG/2ML IJ SOLN
4.0000 mg | Freq: Four times a day (QID) | INTRAMUSCULAR | Status: DC | PRN
  Administered 2021-03-11: 4 mg via INTRAVENOUS
  Filled 2021-03-10: qty 2

## 2021-03-10 MED ORDER — LORATADINE 10 MG PO TABS
10.0000 mg | ORAL_TABLET | Freq: Every day | ORAL | Status: DC
  Administered 2021-03-11: 10 mg via ORAL
  Filled 2021-03-10 (×2): qty 1

## 2021-03-10 MED ORDER — HYDROMORPHONE 1 MG/ML IV SOLN
INTRAVENOUS | Status: DC
  Administered 2021-03-10: 30 mg via INTRAVENOUS
  Administered 2021-03-10: 7.5 mg via INTRAVENOUS
  Administered 2021-03-10: 4.5 mg via INTRAVENOUS
  Administered 2021-03-10: 6 mg via INTRAVENOUS
  Administered 2021-03-10: 7 mg via INTRAVENOUS
  Administered 2021-03-11: 30 mg via INTRAVENOUS
  Administered 2021-03-11: 9.5 mg via INTRAVENOUS
  Administered 2021-03-11: 6.5 mg via INTRAVENOUS
  Filled 2021-03-10 (×2): qty 30

## 2021-03-10 NOTE — ED Provider Notes (Signed)
[facility] DEPT Provider Note   CSN: [id number] Arrival date & time: 03/10/21  0439     History Chief Complaint  Patient presents with   Sickle Cell Pain Crisis    Martin [name] is a 30 y.o. male presents to the emergency department with 2 days of sickle cell pain.  Patient reports pain is severe, worst in his low back and hips.  Reports this is standard for him.  No trauma.  Patient reports taking oxycodone at home without relief.  Reports compliance with all of his medications including hydroxyurea.  Records reviewed.  Patient was admitted in early November at [facility] for similar.  Does not wear oxygen at home.  Denies chest pain or shortness of breath.  No specific aggravating or alleviating factors.  The history is provided by the patient and medical records. No language interpreter was used.      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   Itching of male genitalia 02/22/2020   Abnormal penile discharge, without blood 02/22/2020   Leukocytosis 01/02/2020   Anxiety 11/27/2019   Proteinuria 11/27/2019   Acute on chronic respiratory failure ([place]) 11/16/2019   Scleral icterus    Chronic, continuous use of opioids 08/15/2019   Seasonal allergies 08/15/2019   Chest congestion    Sickle cell anemia with crisis ([place]) 08/04/2019   Other chronic pain 08/04/2019   History of pulmonary embolus (PE) 08/04/2019   Single subsegmental pulmonary embolism without acute cor pulmonale ([place]) 02/10/2019   Acute chest syndrome  due to sickle cell crisis ([place]) 02/10/2019   Sickle cell crisis ([place]) 01/17/2019   Vaso-occlusive pain due to sickle cell disease ([place]) 03/09/2018   Bone pain 03/07/2018   Hip pain 03/07/2018   Acute bronchitis due to Streptococcus 02/21/2018   Heart murmur 02/03/2017   Sickle cell disease with crisis ([place]) 02/02/2017   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[place]) 12/20/2016   Vitamin D deficiency 08/14/2016   Sickle cell pain crisis ([place]) 02/12/2016   High risk medication use 09/27/2014   Generalized anxiety disorder 05/19/2014   Gastro-esophageal reflux disease without esophagitis 05/19/2014   Marijuana use 11/07/2012   Human immunodeficiency virus (HIV) disease ([place]) 05/23/2012    Past Surgical History:  Procedure Laterality Date   IR IMAGING GUIDED PORT INSERTION  08/29/2019   IR REMOVAL TUN ACCESS W/ PORT W/O FL MOD SED  08/30/2020   TEE WITHOUT CARDIOVERSION N/A 08/30/2020   Procedure: TRANSESOPHAGEAL ECHOCARDIOGRAM (TEE);  Surgeon: Pixie Casino, MD;  Location: [facility] ENDOSCOPY;  Service: Cardiovascular;  Laterality: N/A;       Family History  Problem Relation Age of Onset   Sickle cell trait Mother    Sickle cell trait Father    Birth defects Maternal Grandmother    Birth defects Paternal Grandmother     Social History   Tobacco Use   Smoking status: Never   Smokeless tobacco: Never  Vaping Use   Vaping Use: Some days   Substances:  THC  Substance Use Topics   Alcohol use: No   Drug use: Yes    Types: Marijuana    Comment: daily    Home Medications Prior to Admission medications   Medication Sig Start Date End Date Taking? Authorizing Provider  abacavir-dolutegravir-lamiVUDine (TRIUMEQ) 600-50-300 MG tablet Take 1 tablet by mouth at bedtime.     [provider]  acetaminophen (TYLENOL) 325 MG tablet Take 650 mg by mouth every 6 (six) hours as needed for pain or headache. 11/28/20   [provider]  ALPRAZolam Prudy Feeler) 1  MG tablet Take 1 mg by mouth 3 (three) times daily as needed for anxiety.    [provider]  apixaban (ELIQUIS) 5 MG TABS tablet Take 5 mg by mouth 2 (two) times daily.    [provider]  cetirizine (ZYRTEC ALLERGY) 10 MG tablet Take 1 tablet (10 mg total) by mouth daily. 08/21/20 08/21/21  Massie Maroon, [name]  hydroxyurea (HYDREA) 500 MG capsule Take 4 capsules (2,000 mg total) by mouth at bedtime. 10/07/20   Massie Maroon, [name]  Multiple Vitamin (MULTIVITAMIN WITH MINERALS) TABS tablet Take 1 tablet by mouth daily with breakfast.     [provider]  naloxone (NARCAN) nasal spray 4 mg/0.1 mL Place 4 mg into the nose as needed for opioid reversal. 05/19/17   [provider]  oxyCODONE-acetaminophen (PERCOCET) 10-325 MG tablet Take 1 tablet by mouth every 6 (six) hours as needed for pain. 03/04/21 03/04/22  Massie Maroon, [name]  Vitamin D, Ergocalciferol, (DRISDOL) 1.25 MG (50000 UT) CAPS capsule Take 1 capsule (50,000 Units total) by mouth every 7 (seven) days. Patient taking differently: Take 50,000 Units by mouth every Tuesday. 11/01/18   [name], [name]    Allergies    Ketorolac tromethamine, Tape, and Wound dressing adhesive  Review of Systems   Review of Systems  Constitutional:  Negative for appetite change, diaphoresis, fatigue, fever and unexpected weight change.  HENT:  Negative for mouth sores.   Eyes:  Negative for visual disturbance.  Respiratory:  Negative for cough, chest tightness, shortness of breath and wheezing.   Cardiovascular:  Negative for chest pain.  Gastrointestinal:  Negative for abdominal pain, constipation, diarrhea, nausea and vomiting.  Endocrine: Negative for polydipsia, polyphagia and polyuria.  Genitourinary:  Negative for dysuria, frequency, hematuria and urgency.  Musculoskeletal:  Positive for arthralgias (hip pain), back pain and myalgias. Negative for neck stiffness.  Skin:  Negative for rash.   Allergic/Immunologic: Negative for immunocompromised state.  Neurological:  Negative for syncope, light-headedness and headaches.  Hematological:  Does not bruise/bleed easily.  Psychiatric/Behavioral:  Negative for sleep disturbance. The patient is not nervous/anxious.    Physical Exam Updated Vital Signs BP 106/73   Pulse 88   Temp 98 F (36.7 C) (Oral)   Resp 17   SpO2 93%   Physical Exam Vitals and nursing note reviewed.  Constitutional:      General: He is not in acute distress.    Appearance: He is not diaphoretic.     Comments: Very uncomfortable, writhing in bed  HENT:     Head: Normocephalic.  Eyes:     General: No scleral icterus.    Conjunctiva/sclera: Conjunctivae normal.  Cardiovascular:     Rate and Rhythm: Normal rate and regular rhythm.     Pulses: Normal pulses.          Radial pulses are 2+ on the right side and 2+ on the left side.  Pulmonary:     Effort: No tachypnea, accessory muscle usage, prolonged expiration, respiratory distress or retractions.     Breath sounds: No stridor.     Comments: Equal chest rise. No increased work of breathing. Clear and equal breath sounds SPO2 85% on room air with good pleth and warm hands Abdominal:     General: There is no distension.     Palpations: Abdomen is soft.     Tenderness: There is no abdominal tenderness. There is no guarding or rebound.  Musculoskeletal:     Cervical back: Normal range of motion.     Comments: Moves all extremities equally and without difficulty.  Skin:    General: Skin is warm and dry.     Capillary Refill: Capillary refill takes less than 2 seconds.  Neurological:     Mental Status: He is alert.     GCS: GCS eye subscore is 4. GCS verbal subscore is 5. GCS motor subscore is 6.     Comments: Speech is clear and goal oriented.  Psychiatric:        Mood and Affect: Mood normal.    ED Results / Procedures / Treatments   Labs (all labs ordered are listed, but only abnormal  results are displayed) Labs Reviewed  CBC WITH DIFFERENTIAL/PLATELET - Abnormal; Notable for the following components:      Result Value   WBC 12.7 (*)    RBC 2.03 (*)    Hemoglobin 8.2 (*)    HCT 22.4 (*)    MCV 110.3 (*)    MCH 40.4 (*)    MCHC 36.6 (*)    RDW 21.7 (*)    nRBC 32.5 (*)    Lymphs Abs 7.5 (*)    Eosinophils Absolute 0.6 (*)    All other components within normal limits  COMPREHENSIVE METABOLIC PANEL - Abnormal; Notable for the following components:   Potassium 3.4 (*)    Calcium 8.6 (*)    Total Protein 9.1 (*)    Total Bilirubin 6.2 (*)    All other components within normal limits  RETICULOCYTES - Abnormal; Notable for the following components:   Retic Ct Pct 8.7 (*)    RBC. 2.01 (*)    Immature Retic Fract 36.6 (*)    All other components within normal limits  RESP PANEL BY RT-PCR (FLU A&B, COVID) [id number]  URINALYSIS, ROUTINE W REFLEX MICROSCOPIC  TYPE AND SCREEN  TROPONIN I (HIGH SENSITIVITY)    EKG EKG Interpretation  Date/Time:  Monday March 10 2021 04:56:50 EST Ventricular Rate:  91 PR Interval:  192 QRS Duration: 90 QT Interval:  384 QTC Calculation: 473 R Axis:   84 Text Interpretation: Sinus rhythm LVH by voltage Probable inferior infarct, old Confirmed by Quintella Reichert [phone]) on 03/10/2021 5:01:01 AM  Radiology DG Chest 2 View  Result Date: 03/10/2021 CLINICAL DATA:  30 year old male with history of hypoxia. History of sickle cell disease. EXAM: CHEST - 2 VIEW COMPARISON:  Chest x-ray 01/14/2021. FINDINGS: Lung volumes are normal. Widespread areas of interstitial prominence and peribronchial cuffing, similar to several prior examinations. No consolidative airspace disease. No pleural effusions. No pneumothorax. No pulmonary nodule or mass noted. Pulmonary vasculature and the cardiomediastinal silhouette are within normal limits. IMPRESSION: 1. No radiographic evidence of acute cardiopulmonary disease. 2. The appearance of the lungs is  very similar to several prior examinations, suggestive of multifocal scarring in this patient with history of sickle cell disease. Electronically Signed   By: Mauri Brooklyn.D.  On: 03/10/2021 05:38    Procedures .Critical Care Performed by: Abigail Butts, PA-C Authorized by: Abigail Butts, PA-C   Critical care provider statement:    Critical care time (minutes):  45   Critical care time was exclusive of:  Separately billable procedures and treating other patients and teaching time   Critical care was necessary to treat or prevent imminent or life-threatening deterioration of the following conditions:  Circulatory failure   Critical care was time spent personally by me on the following activities:  Development of treatment plan with patient or surrogate, discussions with consultants, evaluation of patient's response to treatment, examination of patient, ordering and review of laboratory studies, ordering and review of radiographic studies, ordering and performing treatments and interventions, pulse oximetry, re-evaluation of patient's condition and review of old charts   I assumed direction of critical care for this patient from another provider in my specialty: no     Care discussed with: admitting provider     Medications Ordered in ED Medications  0.45 % sodium chloride infusion ( Intravenous New Bag/Given 03/10/21 0528)  ondansetron (ZOFRAN) injection 4 mg (has no administration in time range)  diphenhydrAMINE (BENADRYL) capsule 25-50 mg (50 mg Oral Given 03/10/21 0527)  HYDROmorphone (DILAUDID) injection 2 mg (2 mg Intravenous Given 03/10/21 0527)  HYDROmorphone (DILAUDID) injection 2 mg (2 mg Intravenous Given 03/10/21 0604)    ED Course  I have reviewed the triage vital signs and the nursing notes.  Pertinent labs & imaging results that were available during my care of the patient were reviewed by me and considered in my medical decision making (see chart for  details).  Clinical Course as of 03/10/21 0622  Mon Mar 10, 2021  0558 Hemoglobin(!): 8.2 Baseline [HM]  0558 Retic Ct Pct(!): 8.7 Below baseline [HM]  0558 Total Bilirubin(!): 6.2 baseline [HM]  0601 DG Chest 2 View Unchanged from previous - no infectious symptoms. No missed doses of Xarelto [HM]  0601 Troponin I (High Sensitivity): 4 WNL [HM]  0602 SpO2: 93 % On 3L via [place] [HM]    Clinical Course User Index [HM] Niyati Heinke, Gwenlyn Perking   MDM Rules/Calculators/A&P                           Patient presents with sickle cell pain crisis and hypoxia.  Patient is often admitted because of this.  Does not wear oxygen at home.  No chest pain or shortness of breath.  Chest x-ray unchanged from prior.  Less likely to be acute chest.  He reports compliance with his Xarelto -less likely to be pulmonary embolism.  Patient continues to have pain.  Patient will need to be admitted.  Discussed with Dr. Tonie Griffith who will admit.    Final Clinical Impression(s) / ED Diagnoses Final diagnoses:  Sickle cell pain crisis [facility])  Hypoxia    Rx / DC Orders ED Discharge Orders     None        Raffi Milstein, Gwenlyn Perking 03/10/21 [id number]    Quintella Reichert, MD 03/10/21 0630

## 2021-03-10 NOTE — H&P (Signed)
H&P  Patient Demographics:  Martin [name], is a 30 y.o. male  MRN: [id number]   DOB - 1990-07-28  Admit Date - 03/10/2021  Outpatient Primary MD for the patient is [name], [name]  Chief Complaint  Patient presents with   Sickle Cell Pain Crisis      HPI:   Martin [name]  is a 30 y.o. male with a medical history significant for sickle cell disease, chronic pain syndrome, opiate dependence and tolerance, HIV disease, history of pulmonary embolism on Eliquis, and history of anemia of chronic disease presents with complaints of upper back and central chest pain that is consistent with his previous sickle cell crisis.  Patient states he was in usual state of health until yesterday morning, he awakened with sharp and constant pain.  Pain was unrelieved by his usual home medication, Percocet 10-325 mg.  Patient states that he has been taking Eliquis consistently for history of pulmonary embolism.  He claims that he has not missed any doses.  His current pain intensity is 8/10.  While in the emergency department, patient hypoxic with oxygen saturation around 84%.  Patient has home oxygen, but has not been wearing it consistently due to recent travel.  He currently denies shortness of breath, dizziness, fever, chills.  No urinary symptoms, nausea, vomiting, or diarrhea.  He denies any known exposure to COVID-19.  ER course: While in ER, patient's oxygen saturation decreased to around 84%.  He did not arrive with home oxygen.  Patient's oxygen saturation improved to 100% after adding 2 L supplemental oxygen.  Otherwise, vital signs unremarkable.  Reviewed complete metabolic panel, total bilirubin 6.2, otherwise unremarkable.  Complete blood count shows WBCs 12.7, hemoglobin 8.2 g/dL, and platelets 345,000.  Chest x-ray shows no radiographic evidence of acute cardiopulmonary disease.  The appearance of his lungs is very similar to several prior examinations, suggestive of multifocal scarring  that is consistent with his sickle cell disease.  Patient's pain persists despite IV Dilaudid and IV fluids.  Patient will be admitted to [facility] for further management of sickle cell pain crisis.     Review of systems:  In addition to the HPI above, patient reports No fever or chills No Headache, No changes with vision or hearing No problems swallowing food or liquids No chest pain, cough or shortness of breath No abdominal pain, No nausea or vomiting, Bowel movements are regular No blood in stool or urine No dysuria No new skin rashes or bruises No new joints pains-aches No new weakness, tingling, numbness in any extremity No recent weight gain or loss No polyuria, polydypsia or polyphagia No significant Mental Stressors  With Past History of the following :   Past Medical History:  Diagnosis Date   Anxiety    HIV (human immunodeficiency virus infection) ([place])    Proteinuria    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[place])    Vitamin D deficiency 10/2018      Past Surgical History:  Procedure Laterality Date   IR IMAGING GUIDED PORT INSERTION  08/29/2019   IR REMOVAL TUN ACCESS W/ PORT W/O FL MOD SED  08/30/2020   TEE WITHOUT CARDIOVERSION N/A 08/30/2020   Procedure: TRANSESOPHAGEAL ECHOCARDIOGRAM (TEE);  Surgeon: Pixie Casino, MD;  Location: [facility] ENDOSCOPY;  Service: Cardiovascular;  Laterality: N/A;     Social History:   Social History   Tobacco Use   Smoking status: Never   Smokeless tobacco: Never  Substance Use Topics   Alcohol use: No  Lives - At home   Family History :   Family History  Problem Relation Age of Onset   Sickle cell trait Mother    Sickle cell trait Father    Birth defects Maternal Grandmother    Birth defects Paternal Grandmother      Home Medications:   Prior to Admission medications   Medication Sig Start Date End Date Taking? Authorizing Provider  abacavir-dolutegravir-lamiVUDine (TRIUMEQ) 600-50-300 MG tablet Take 1 tablet by mouth at  bedtime.    Yes [provider]  acetaminophen (TYLENOL) 325 MG tablet Take 650 mg by mouth every 6 (six) hours as needed for pain or headache. 11/28/20  Yes [provider]  ALPRAZolam Duanne Moron) 1 MG tablet Take 1 mg by mouth 3 (three) times daily as needed for anxiety.   Yes [provider]  apixaban (ELIQUIS) 5 MG TABS tablet Take 5 mg by mouth 2 (two) times daily.   Yes [provider]  cetirizine (ZYRTEC ALLERGY) 10 MG tablet Take 1 tablet (10 mg total) by mouth daily. Patient taking differently: Take 10 mg by mouth daily as needed for allergies. 08/21/20 08/21/21 Yes [name], [name]  cyclobenzaprine (FLEXERIL) 10 MG tablet Take 10 mg by mouth 3 (three) times daily. 03/04/21  Yes [provider]  hydroxyurea (HYDREA) 500 MG capsule Take 4 capsules (2,000 mg total) by mouth at bedtime. 10/07/20  Yes [name], [name]  melatonin 3 MG TABS tablet Take 6 mg by mouth daily as needed for sleep. 03/04/21  Yes [provider]  Multiple Vitamin (MULTIVITAMIN WITH MINERALS) TABS tablet Take 1 tablet by mouth daily with breakfast.    Yes [provider]  naloxone (NARCAN) nasal spray 4 mg/0.1 mL Place 4 mg into the nose as needed for opioid reversal. 05/19/17  Yes [provider]  oxyCODONE-acetaminophen (PERCOCET) 10-325 MG tablet Take 1 tablet by mouth every 6 (six) hours as needed for pain. 03/04/21 03/04/22 Yes [name], [name]  promethazine (PHENERGAN) 12.5 MG tablet Take 12.5 mg by mouth every 8 (eight) hours as needed for nausea/vomiting. 02/04/21  Yes [provider]  Vitamin D, Ergocalciferol, (DRISDOL) 1.25 MG (50000 UT) CAPS capsule Take 1 capsule (50,000 Units total) by mouth every 7 (seven) days. Patient taking differently: Take 50,000 Units by mouth every Tuesday. 11/01/18   Azzie Glatter, [name]     Allergies:   Allergies  Allergen Reactions   Ketorolac Tromethamine Swelling and Other (See  Comments)    Patient reports facial edema and left arm edema after administration.   Tape Rash and Other (See Comments)    PLEASE DO NOT USE THE CLEAR, THICK, "PLASTIC" TAPE- only paper tape is tolerated    Wound Dressing Adhesive Rash     Physical Exam:   Vitals:   Vitals:   03/10/21 0815 03/10/21 0815  BP: 97/61   Pulse: 91   Resp: 17   Temp:  97.8 F (36.6 C)  SpO2: 92%     Physical Exam: Constitutional: Patient appears well-developed and well-nourished. Not in obvious distress. HENT: Normocephalic, atraumatic, External right and left ear normal. Oropharynx is clear and moist.  Eyes: Conjunctivae and EOM are normal. PERRLA, no scleral icterus. Neck: Normal ROM. Neck supple. No JVD. No tracheal deviation. No thyromegaly. CVS: RRR, S1/S2 +, no murmurs, no gallops, no carotid bruit.  Pulmonary: Effort and breath sounds normal, no stridor, rhonchi, wheezes, rales.  Abdominal: Soft. BS +, no distension, tenderness, rebound or guarding.  Musculoskeletal: Normal range of motion. No edema and no tenderness.  Lymphadenopathy: No lymphadenopathy noted, cervical, inguinal or axillary Neuro: Alert. Normal reflexes, muscle tone coordination. No cranial nerve deficit. Skin: Skin is warm and dry. No rash noted. Not diaphoretic. No erythema. No pallor. Psychiatric: Normal mood and affect. Behavior, judgment, thought content normal.   Data Review:   CBC Recent Labs  Lab 03/10/21 0516  WBC 12.7*  HGB 8.2*  HCT 22.4*  PLT 345  MCV 110.3*  MCH 40.4*  MCHC 36.6*  RDW 21.7*  LYMPHSABS 7.5*  MONOABS 0.9  EOSABS 0.6*  BASOSABS 0.1   ------------------------------------------------------------------------------------------------------------------  Chemistries  Recent Labs  Lab 03/10/21 0516  NA 135  K 3.4*  CL 106  CO2 22  GLUCOSE 78  BUN 12  CREATININE 0.67  CALCIUM 8.6*  AST 35  ALT 18  ALKPHOS 62  BILITOT 6.2*    ------------------------------------------------------------------------------------------------------------------ CrCl cannot be calculated (Unknown ideal weight.). ------------------------------------------------------------------------------------------------------------------ No results for input(s): TSH, T4TOTAL, T3FREE, THYROIDAB in the last 72 hours.  Invalid input(s): FREET3  Coagulation profile No results for input(s): INR, PROTIME in the last 168 hours. ------------------------------------------------------------------------------------------------------------------- No results for input(s): DDIMER in the last 72 hours. -------------------------------------------------------------------------------------------------------------------  Cardiac Enzymes No results for input(s): CKMB, TROPONINI, MYOGLOBIN in the last 168 hours.  Invalid input(s): CK ------------------------------------------------------------------------------------------------------------------ No results found for: BNP  ---------------------------------------------------------------------------------------------------------------  Urinalysis    Component Value Date/Time   COLORURINE YELLOW 08/29/2020 0245   APPEARANCEUR CLEAR 08/29/2020 0245   LABSPEC 1.013 08/29/2020 0245   PHURINE 7.0 08/29/2020 0245   GLUCOSEU NEGATIVE 08/29/2020 0245   HGBUR NEGATIVE 08/29/2020 0245   BILIRUBINUR NEGATIVE 08/29/2020 0245   BILIRUBINUR neg 11/27/2019 1149   KETONESUR NEGATIVE 08/29/2020 0245   PROTEINUR NEGATIVE 08/29/2020 0245   UROBILINOGEN >=8.0 (A) 11/27/2019 1149   UROBILINOGEN 4.0 (H) 05/21/2009 1944   NITRITE NEGATIVE 08/29/2020 0245   LEUKOCYTESUR TRACE (A) 08/29/2020 0245    ----------------------------------------------------------------------------------------------------------------   Imaging Results:    DG Chest 2 View  Result Date: 03/10/2021 CLINICAL DATA:  30 year old male with history of  hypoxia. History of sickle cell disease. EXAM: CHEST - 2 VIEW COMPARISON:  Chest x-ray 01/14/2021. FINDINGS: Lung volumes are normal. Widespread areas of interstitial prominence and peribronchial cuffing, similar to several prior examinations. No consolidative airspace disease. No pleural effusions. No pneumothorax. No pulmonary nodule or mass noted. Pulmonary vasculature and the cardiomediastinal silhouette are within normal limits. IMPRESSION: 1. No radiographic evidence of acute cardiopulmonary disease. 2. The appearance of the lungs is very similar to several prior examinations, suggestive of multifocal scarring in this patient with history of sickle cell disease. Electronically Signed   By: Trudie Reed M.D.   On: 03/10/2021 05:38     Assessment & Plan:  Principal Problem:   Sickle cell pain crisis (HCC) Active Problems:   Generalized anxiety disorder   Human immunodeficiency virus (HIV) disease (HCC)   Sickle cell crisis (HCC)   History of pulmonary embolus (PE)  Sickle cell disease with pain crisis: Initiate IV Dilaudid PCA with settings of 0.5 mg, 10-minute lockout, and 3 mg/h Hold Toradol due to listed allergy IV fluids, 0.45% saline at 100 mL/h Monitor vital signs very closely, reevaluate pain scale regularly, and supplemental oxygen as needed. Patient will be reevaluated for pain in the context of functioning and relationship to baseline as his care progresses.  Chronic pain syndrome: Will restart home medications as pain intensity improves  Leukocytosis: WBCs 12.7.  Patient is afebrile.  COVID-19 and  influenza tests negative.  Continue to follow closely without antibiotics.  Labs in AM.  Anemia of chronic disease: Patient's hemoglobin is 8.3 g/dL, consistent with his baseline.  There is no clinical indication for blood transfusion at this time.  Continue to monitor closely.  History of HIV: Well-controlled.  Followed by infectious disease as an outpatient.  We will  continue antiviral medications.  History of pulmonary embolism: Will continue apixaban  Generalized anxiety disorder: Stable.  No suicidal homicidal intentions today.  Continue home medications as needed.   DVT Prophylaxis: Apixaban and SCD    AM Labs Ordered, also please review Full Orders  Family Communication: Admission, patient's condition and plan of care including tests being ordered have been discussed with the patient who indicate understanding and agree with the plan and Code Status.  Code Status: Full Code  Consults called: None    Admission status: Inpatient    Time spent in minutes : 30 minutes [place], [name], [name] [facility] [address] [place],  [address] [phone]  03/10/2021 at 8:28 AM

## 2021-03-10 NOTE — ED Triage Notes (Addendum)
Pt reports with [facility] x 1 day. Pt states that the pain is all over.

## 2021-03-10 NOTE — ED Notes (Signed)
To X-ray

## 2021-03-11 DIAGNOSIS — D5700 Hb-SS disease with crisis, unspecified: Principal | ICD-10-CM

## 2021-03-11 LAB — CBC
HCT: 22.9 % — ABNORMAL LOW (ref 39.0–52.0)
Hemoglobin: 8.4 g/dL — ABNORMAL LOW (ref 13.0–17.0)
MCH: 40 pg — ABNORMAL HIGH (ref 26.0–34.0)
MCHC: 36.7 g/dL — ABNORMAL HIGH (ref 30.0–36.0)
MCV: 109 fL — ABNORMAL HIGH (ref 80.0–100.0)
Platelets: 321 10*3/uL (ref 150–400)
RBC: 2.1 MIL/uL — ABNORMAL LOW (ref 4.22–5.81)
RDW: 20.6 % — ABNORMAL HIGH (ref 11.5–15.5)
WBC: 13.4 10*3/uL — ABNORMAL HIGH (ref 4.0–10.5)
nRBC: 12.9 % — ABNORMAL HIGH (ref 0.0–0.2)

## 2021-03-11 MED ORDER — LIP MEDEX EX OINT
TOPICAL_OINTMENT | CUTANEOUS | Status: AC
  Filled 2021-03-11: qty 7

## 2021-03-11 MED ORDER — SALINE SPRAY 0.65 % NA SOLN
1.0000 | NASAL | Status: DC | PRN
  Filled 2021-03-11: qty 44

## 2021-03-11 NOTE — Discharge Summary (Signed)
Physician Discharge Summary  Martin [name] LOV:[id number] DOB: 12-08-1990 DOA: 03/10/2021  PCP: [name], [name]  Admit date: 03/10/2021  Discharge date: 03/14/2021  Discharge Diagnoses:  Principal Problem:   Sickle cell pain crisis ([place]) Active Problems:   Generalized anxiety disorder   Human immunodeficiency virus (HIV) disease ([place])   Sickle cell crisis ([place])   History of pulmonary embolus (PE)   Discharge Condition: Stable  Disposition:   Follow-up Information     [name], [name] Follow up.   Specialty: Family Medicine Contact information: [place]. [place] [address] [phone]                Pt is discharged home in good condition and is to follow up with [name], [name] this week to have labs evaluated. Martin [name] is instructed to increase activity slowly and balance with rest for the next few days, and use prescribed medication to complete treatment of pain  Diet: Regular Wt Readings from Last 3 Encounters:  03/11/21 69.2 kg  01/13/21 68 kg  11/19/20 64.9 kg    History of present illness:  Martin [name]  is a 30 y.o. male with a medical history significant for sickle cell disease, chronic pain syndrome, opiate dependence and tolerance, HIV disease, history of pulmonary embolism on Eliquis, and history of anemia of chronic disease presents with complaints of upper back and central chest pain that is consistent with his previous sickle cell crisis.  Patient states he was in usual state of health until yesterday morning, he awakened with sharp and constant pain.  Pain was unrelieved by his usual home medication, Percocet 10-325 mg.  Patient states that he has been taking Eliquis consistently for history of pulmonary embolism.  He claims that he has not missed any doses.  His current pain intensity is 8/10.  While in the emergency department, patient hypoxic with oxygen saturation around 84%.  Patient has  home oxygen, but has not been wearing it consistently due to recent travel.  He currently denies shortness of breath, dizziness, fever, chills.  No urinary symptoms, nausea, vomiting, or diarrhea.  He denies any known exposure to COVID-19.  ER course: While in ER, patient's oxygen saturation decreased to around 84%.  He did not arrive with home oxygen.  Patient's oxygen saturation improved to 100% after adding 2 L supplemental oxygen.  Otherwise, vital signs unremarkable.  Reviewed complete metabolic panel, total bilirubin 6.2, otherwise unremarkable.  Complete blood count shows WBCs 12.7, hemoglobin 8.2 g/dL, and platelets 345,000.  Chest x-ray shows no radiographic evidence of acute cardiopulmonary disease.  The appearance of his lungs is very similar to several prior examinations, suggestive of multifocal scarring that is consistent with his sickle cell disease.  Patient's pain persists despite IV Dilaudid and IV fluids.  Patient will be admitted to [facility] for further management of sickle cell pain crisis.     Hospital Course:  Sickle cell disease with pain crisis: Patient was admitted for sickle cell pain crisis and managed appropriately with IVF, IV Dilaudid via PCA and IV Toradol, as well as other adjunct therapies per sickle cell pain management protocols.  Patient states the pain intensity is 2/10 and is requesting discharge home.  He will resume home medications.  Anemia of chronic disease: Patient's hemoglobin remains consistent with his baseline.  Will follow-up with PCP on 03/17/2021, will repeat CBC with differential at that time.  History of PE: Will continue apixaban.  Patient  advised to take this medication consistently in order to achieve positive outcomes.  He expressed understanding.  History of HIV infection: Patient advised to follow-up with infectious disease specialist as scheduled.  Continue home medications.   Patient is alert, oriented, and ambulating without  assistance.  Patient advised to resume home oxygen at 2 L.   Patient was therefore discharged home today in a hemodynamically stable condition.   Martin [name] will follow-up with PCP within 1 week of this discharge. Martin [name] was counseled extensively about nonpharmacologic means of pain management, patient verbalized understanding and was appreciative of  the care received during this admission.   We discussed the need for good hydration, monitoring of hydration status, avoidance of heat, cold, stress, and infection triggers. We discussed the need to be adherent with taking Hydrea and other home medications. Patient was reminded of the need to seek medical attention immediately if any symptom of bleeding, anemia, or infection occurs.  Discharge Exam: Vitals:   03/11/21 0845 03/11/21 1216  BP: 109/73 114/69  Pulse: 89 90  Resp: 16 16  Temp: 97.8 F (36.6 C) 98.5 F (36.9 C)  SpO2: 91% 93%   Vitals:   03/11/21 0335 03/11/21 0812 03/11/21 0845 03/11/21 1216  BP: 110/67  109/73 114/69  Pulse: 82  89 90  Resp:  _0 Temp: 98.4 F (36.9 C)  97.8 F (36.6 C) 98.5 F (36.9 C)  TempSrc: Oral  Oral Oral  SpO2: 97% 98% 91% 93%  Weight:   69.2 kg   Height:   _1  (1.905 m)     General appearance : Awake, alert, not in any distress. Speech Clear. Not toxic looking HEENT: Atraumatic and Normocephalic, pupils equally reactive to light and accomodation Neck: Supple, no JVD. No cervical lymphadenopathy.  Chest: Good air entry bilaterally, no added sounds  CVS: S1 S2 regular, no murmurs.  Abdomen: Bowel sounds present, Non tender and not distended with no gaurding, rigidity or rebound. Extremities: B/L Lower Ext shows no edema, both legs are warm to touch Neurology: Awake alert, and oriented X 3, CN II-XII intact, Non focal Skin: No Rash  Discharge Instructions  Discharge Instructions     Discharge patient   Complete by: As directed    Discharge disposition: 01-Home or Self Care    Discharge patient date: 03/11/2021      Allergies as of 03/11/2021       Reactions   Ketorolac Tromethamine Swelling, Other (See Comments)   Patient reports facial edema and left arm edema after administration.   Tape Rash, Other (See Comments)   PLEASE DO NOT USE THE CLEAR, THICK, "PLASTIC" TAPE- only paper tape is tolerated    Wound Dressing Adhesive Rash        Medication List     TAKE these medications    acetaminophen 325 MG tablet Commonly known as: TYLENOL Take 650 mg by mouth every 6 (six) hours as needed for pain or headache.   ALPRAZolam 1 MG tablet Commonly known as: XANAX Take 1 mg by mouth 3 (three) times daily as needed for anxiety.   apixaban 5 MG Tabs tablet Commonly known as: ELIQUIS Take 5 mg by mouth 2 (two) times daily.   cetirizine 10 MG tablet Commonly known as: ZyrTEC Allergy Take 1 tablet (10 mg total) by mouth daily. What changed:  when to take this reasons to take this   cyclobenzaprine 10 MG tablet Commonly known as: FLEXERIL Take 10 mg by mouth 3 (three) times  daily.   hydroxyurea 500 MG capsule Commonly known as: HYDREA Take 4 capsules (2,000 mg total) by mouth at bedtime.   melatonin 3 MG Tabs tablet Take 6 mg by mouth daily as needed for sleep.   multivitamin with minerals Tabs tablet Take 1 tablet by mouth daily with breakfast.   naloxone 4 MG/0.1ML Liqd nasal spray kit Commonly known as: NARCAN Place 4 mg into the nose as needed for opioid reversal.   oxyCODONE-acetaminophen 10-325 MG tablet Commonly known as: Percocet Take 1 tablet by mouth every 6 (six) hours as needed for pain.   promethazine 12.5 MG tablet Commonly known as: PHENERGAN Take 12.5 mg by mouth every 8 (eight) hours as needed for nausea/vomiting.   Triumeq 600-50-300 MG tablet Generic drug: abacavir-dolutegravir-lamiVUDine Take 1 tablet by mouth at bedtime.   Vitamin D (Ergocalciferol) 1.25 MG (50000 UNIT) Caps capsule Commonly known as:  DRISDOL Take 1 capsule (50,000 Units total) by mouth every 7 (seven) days. What changed: when to take this        The results of significant diagnostics from this hospitalization (including imaging, microbiology, ancillary and laboratory) are listed below for reference.    Significant Diagnostic Studies: DG Chest 2 View  Result Date: 03/10/2021 CLINICAL DATA:  30 year old male with history of hypoxia. History of sickle cell disease. EXAM: CHEST - 2 VIEW COMPARISON:  Chest x-ray 01/14/2021. FINDINGS: Lung volumes are normal. Widespread areas of interstitial prominence and peribronchial cuffing, similar to several prior examinations. No consolidative airspace disease. No pleural effusions. No pneumothorax. No pulmonary nodule or mass noted. Pulmonary vasculature and the cardiomediastinal silhouette are within normal limits. IMPRESSION: 1. No radiographic evidence of acute cardiopulmonary disease. 2. The appearance of the lungs is very similar to several prior examinations, suggestive of multifocal scarring in this patient with history of sickle cell disease. Electronically Signed   By: Vinnie Langton M.D.   On: 03/10/2021 05:38    Microbiology: Recent Results (from the past 240 hour(s))  Resp Panel by RT-PCR (Flu A&B, Covid) Nasopharyngeal Swab     Status: None   Collection Time: 03/10/21  6:03 AM   Specimen: Nasopharyngeal Swab; Nasopharyngeal(NP) swabs in vial transport medium  Result Value Ref Range Status   SARS Coronavirus 2 by RT PCR NEGATIVE NEGATIVE Final    Comment: (NOTE) SARS-CoV-2 target nucleic acids are NOT DETECTED.  The SARS-CoV-2 RNA is generally detectable in upper respiratory specimens during the acute phase of infection. The lowest concentration of SARS-CoV-2 viral copies this assay can detect is 138 copies/mL. A negative result does not preclude SARS-Cov-2 infection and should not be used as the sole basis for treatment or other patient management decisions. A  negative result may occur with  improper specimen collection/handling, submission of specimen other than nasopharyngeal swab, presence of viral mutation(s) within the areas targeted by this assay, and inadequate number of viral copies(<138 copies/mL). A negative result must be combined with clinical observations, patient history, and epidemiological information. The expected result is Negative.  Fact Sheet for Patients:  EntrepreneurPulse.com.au  Fact Sheet for Healthcare Providers:  IncredibleEmployment.be  This test is no t yet approved or cleared by the [place] FDA and  has been authorized for detection and/or diagnosis of SARS-CoV-2 by FDA under an Emergency Use Authorization (EUA). This EUA will remain  in effect (meaning this test can be used) for the duration of the COVID-19 declaration under Section 564(b)(1) of the Act, 21 U.S.C.section 360bbb-3(b)(1), unless the authorization is terminated  or revoked  sooner.       Influenza A by PCR NEGATIVE NEGATIVE Final   Influenza B by PCR NEGATIVE NEGATIVE Final    Comment: (NOTE) The Xpert Xpress SARS-CoV-2/FLU/RSV plus assay is intended as an aid in the diagnosis of influenza from Nasopharyngeal swab specimens and should not be used as a sole basis for treatment. Nasal washings and aspirates are unacceptable for Xpert Xpress SARS-CoV-2/FLU/RSV testing.  Fact Sheet for Patients: EntrepreneurPulse.com.au  Fact Sheet for Healthcare Providers: IncredibleEmployment.be  This test is not yet approved or cleared by the [place] FDA and has been authorized for detection and/or diagnosis of SARS-CoV-2 by FDA under an Emergency Use Authorization (EUA). This EUA will remain in effect (meaning this test can be used) for the duration of the COVID-19 declaration under Section 564(b)(1) of the Act, 21 U.S.C. section 360bbb-3(b)(1), unless the authorization  is terminated or revoked.  Performed at [facility], [place] [address], [place], [place] [address]      Labs: Basic Metabolic Panel: Recent Labs  Lab 03/10/21 0516  NA 135  K 3.4*  CL 106  CO2 22  GLUCOSE 78  BUN 12  CREATININE 0.67  CALCIUM 8.6*   Liver Function Tests: Recent Labs  Lab 03/10/21 0516  AST 35  ALT 18  ALKPHOS 62  BILITOT 6.2*  PROT 9.1*  ALBUMIN 3.8   No results for input(s): LIPASE, AMYLASE in the last 168 hours. No results for input(s): AMMONIA in the last 168 hours. CBC: Recent Labs  Lab 03/10/21 0516 03/11/21 0922  WBC 12.7* 13.4*  NEUTROABS 3.5  --   HGB 8.2* 8.4*  HCT 22.4* 22.9*  MCV 110.3* 109.0*  PLT 345 321   Cardiac Enzymes: No results for input(s): CKTOTAL, CKMB, CKMBINDEX, TROPONINI in the last 168 hours. BNP: Invalid input(s): POCBNP CBG: No results for input(s): GLUCAP in the last 168 hours.  Time coordinating discharge: 30 minutes  Signed: Donia Pounds  APRN, [name], [name] [facility] [address] [place], [place] [address] [phone]  [facility] 03/14/2021, 4:46 PM

## 2021-03-14 NOTE — Discharge Summary (Deleted)
H&P  Patient Demographics:  Martin [name], is a 30 y.o. male  MRN: [id number]   DOB - September 25, 1990  Admit Date - 03/10/2021  Outpatient Primary MD for the patient is Massie Maroon, [name]  Chief Complaint  Patient presents with   Sickle Cell Pain Crisis      HPI:   Martin [name]  is a 30 y.o. male with a medical history significant for sickle cell disease, chronic pain syndrome, opiate dependence and tolerance, HIV disease, history of pulmonary embolism on Eliquis, and history of anemia of chronic disease presents with complaints of upper back and central chest pain that is consistent with his previous sickle cell crisis.  Patient states he was in usual state of health until yesterday morning, he awakened with sharp and constant pain.  Pain was unrelieved by his usual home medication, Percocet 10-325 mg.  Patient states that he has been taking Eliquis consistently for history of pulmonary embolism.  He claims that he has not missed any doses.  His current pain intensity is 8/10.  While in the emergency department, patient hypoxic with oxygen saturation around 84%.  Patient has home oxygen, but has not been wearing it consistently due to recent travel.  He currently denies shortness of breath, dizziness, fever, chills.  No urinary symptoms, nausea, vomiting, or diarrhea.  He denies any known exposure to COVID-19.  ER course: While in ER, patient's oxygen saturation decreased to around 84%.  He did not arrive with home oxygen.  Patient's oxygen saturation improved to 100% after adding 2 L supplemental oxygen.  Otherwise, vital signs unremarkable.  Reviewed complete metabolic panel, total bilirubin 6.2, otherwise unremarkable.  Complete blood count shows WBCs 12.7, hemoglobin 8.2 g/dL, and platelets 403,474.  Chest x-ray shows no radiographic evidence of acute cardiopulmonary disease.  The appearance of his lungs is very similar to several prior examinations, suggestive of multifocal scarring  that is consistent with his sickle cell disease.  Patient's pain persists despite IV Dilaudid and IV fluids.  Patient will be admitted to [facility] for further management of sickle cell pain crisis.    Review of systems:  In addition to the HPI above, patient reports No fever or chills No Headache, No changes with vision or hearing No problems swallowing food or liquids Nocough or shortness of breath No abdominal pain, No nausea or vomiting, Bowel movements are regular No blood in stool or urine No dysuria No new skin rashes or bruises No new joints pains-aches No new weakness, tingling, numbness in any extremity No recent weight gain or loss No polyuria, polydypsia or polyphagia No significant Mental Stressors   With Past History of the following :   Past Medical History:  Diagnosis Date   Anxiety    HIV (human immunodeficiency virus infection) (HCC)    Proteinuria    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H[facility])    Vitamin D deficiency 10/2018      Past Surgical History:  Procedure Laterality Date   IR IMAGING GUIDED PORT INSERTION  08/29/2019   IR REMOVAL TUN ACCESS W/ PORT W/O FL MOD SED  08/30/2020   TEE WITHOUT CARDIOVERSION N/A 08/30/2020   Procedure: TRANSESOPHAGEAL ECHOCARDIOGRAM (TEE);  Surgeon: [name], MD;  Location: [facility] ENDOSCOPY;  Service: Cardiovascular;  Laterality: N/A;     Social History:   Social History   Tobacco Use   Smoking status: Never   Smokeless tobacco: Never  Substance Use Topics   Alcohol use: No     Lives - At  home   Family History :   Family History  Problem Relation Age of Onset   Sickle cell trait Mother    Sickle cell trait Father    Birth defects Maternal Grandmother    Birth defects Paternal Grandmother      Home Medications:   Prior to Admission medications   Medication Sig Start Date End Date Taking? Authorizing Provider  abacavir-dolutegravir-lamiVUDine (TRIUMEQ) 600-50-300 MG tablet Take 1 tablet by mouth at bedtime.    Yes  [provider]  acetaminophen (TYLENOL) 325 MG tablet Take 650 mg by mouth every 6 (six) hours as needed for pain or headache. 11/28/20  Yes [provider]  ALPRAZolam Duanne Moron) 1 MG tablet Take 1 mg by mouth 3 (three) times daily as needed for anxiety.   Yes [provider]  apixaban (ELIQUIS) 5 MG TABS tablet Take 5 mg by mouth 2 (two) times daily.   Yes [provider]  cetirizine (ZYRTEC ALLERGY) 10 MG tablet Take 1 tablet (10 mg total) by mouth daily. Patient taking differently: Take 10 mg by mouth daily as needed for allergies. 08/21/20 08/21/21 Yes [name], [name]  cyclobenzaprine (FLEXERIL) 10 MG tablet Take 10 mg by mouth 3 (three) times daily. 03/04/21  Yes [provider]  hydroxyurea (HYDREA) 500 MG capsule Take 4 capsules (2,000 mg total) by mouth at bedtime. 10/07/20  Yes [name], [name]  melatonin 3 MG TABS tablet Take 6 mg by mouth daily as needed for sleep. 03/04/21  Yes [provider]  Multiple Vitamin (MULTIVITAMIN WITH MINERALS) TABS tablet Take 1 tablet by mouth daily with breakfast.    Yes [provider]  naloxone (NARCAN) nasal spray 4 mg/0.1 mL Place 4 mg into the nose as needed for opioid reversal. 05/19/17  Yes [provider]  oxyCODONE-acetaminophen (PERCOCET) 10-325 MG tablet Take 1 tablet by mouth every 6 (six) hours as needed for pain. 03/04/21 03/04/22 Yes [name], [name]  promethazine (PHENERGAN) 12.5 MG tablet Take 12.5 mg by mouth every 8 (eight) hours as needed for nausea/vomiting. 02/04/21  Yes [provider]  Vitamin D, Ergocalciferol, (DRISDOL) 1.25 MG (50000 UT) CAPS capsule Take 1 capsule (50,000 Units total) by mouth every 7 (seven) days. Patient taking differently: Take 50,000 Units by mouth every Tuesday. 11/01/18   Azzie Glatter, [name]     Allergies:   Allergies  Allergen Reactions   Ketorolac Tromethamine Swelling and Other (See Comments)    Patient  reports facial edema and left arm edema after administration.   Tape Rash and Other (See Comments)    PLEASE DO NOT USE THE CLEAR, THICK, "PLASTIC" TAPE- only paper tape is tolerated    Wound Dressing Adhesive Rash     Physical Exam:   Vitals:   Vitals:   03/11/21 0845 03/11/21 1216  BP: 109/73 114/69  Pulse: 89 90  Resp: 16 16  Temp: 97.8 F (36.6 C) 98.5 F (36.9 C)  SpO2: 91% 93%    Physical Exam: Constitutional: Patient appears well-developed and well-nourished. Not in obvious distress. HENT: Normocephalic, atraumatic, External right and left ear normal. Oropharynx is clear and moist.  Eyes: Conjunctivae and EOM are normal. PERRLA, no scleral icterus. Neck: Normal ROM. Neck supple. No JVD. No tracheal deviation. No thyromegaly. CVS: RRR, S1/S2 +, no murmurs, no gallops, no carotid bruit.  Pulmonary: Effort and breath sounds normal, no stridor, rhonchi, wheezes, rales.  Abdominal: Soft. BS +, no distension, tenderness, rebound or guarding.  Musculoskeletal: Normal range of motion. No edema and no tenderness.  Lymphadenopathy: No lymphadenopathy noted, cervical, inguinal or axillary Neuro: Alert. Normal reflexes, muscle tone coordination. No cranial nerve deficit. Skin: Skin is warm and dry. No rash noted. Not diaphoretic. No erythema. No pallor. Psychiatric: Normal mood and affect. Behavior, judgment, thought content normal.   Data Review:   CBC Recent Labs  Lab 03/10/21 0516 03/11/21 0922  WBC 12.7* 13.4*  HGB 8.2* 8.4*  HCT 22.4* 22.9*  PLT 345 321  MCV 110.3* 109.0*  MCH 40.4* 40.0*  MCHC 36.6* 36.7*  RDW 21.7* 20.6*  LYMPHSABS 7.5*  --   MONOABS 0.9  --   EOSABS 0.6*  --   BASOSABS 0.1  --    ------------------------------------------------------------------------------------------------------------------  Chemistries  Recent Labs  Lab 03/10/21 0516  NA 135  K 3.4*  CL 106  CO2 22  GLUCOSE 78  BUN 12  CREATININE 0.67  CALCIUM 8.6*  AST 35   ALT 18  ALKPHOS 62  BILITOT 6.2*   ------------------------------------------------------------------------------------------------------------------ estimated creatinine clearance is 131 mL/min (by C-G formula based on SCr of 0.67 mg/dL). ------------------------------------------------------------------------------------------------------------------ No results for input(s): TSH, T4TOTAL, T3FREE, THYROIDAB in the last 72 hours.  Invalid input(s): FREET3  Coagulation profile No results for input(s): INR, PROTIME in the last 168 hours. ------------------------------------------------------------------------------------------------------------------- No results for input(s): DDIMER in the last 72 hours. -------------------------------------------------------------------------------------------------------------------  Cardiac Enzymes No results for input(s): CKMB, TROPONINI, MYOGLOBIN in the last 168 hours.  Invalid input(s): CK ------------------------------------------------------------------------------------------------------------------ No results found for: BNP  ---------------------------------------------------------------------------------------------------------------  Urinalysis    Component Value Date/Time   COLORURINE YELLOW 08/29/2020 [place] 08/29/2020 0245   LABSPEC 1.013 08/29/2020 0245   PHURINE 7.0 08/29/2020 0245   GLUCOSEU NEGATIVE 08/29/2020 0245   HGBUR NEGATIVE 08/29/2020 0245   BILIRUBINUR NEGATIVE 08/29/2020 0245   BILIRUBINUR neg 11/27/2019 1149   [place] 08/29/2020 0245   PROTEINUR NEGATIVE 08/29/2020 0245   UROBILINOGEN >=8.0 (A) 11/27/2019 1149   UROBILINOGEN 4.0 (H) 05/21/2009 1944   NITRITE NEGATIVE 08/29/2020 0245   LEUKOCYTESUR TRACE (A) 08/29/2020 0245    ----------------------------------------------------------------------------------------------------------------   Imaging Results:    No results  found.   Assessment & Plan:  Principal Problem:   Sickle cell pain crisis ([place]) Active Problems:   Generalized anxiety disorder   Human immunodeficiency virus (HIV) disease ([place])   Sickle cell crisis ([place])   History of pulmonary embolus (PE)  Sickle cell disease with pain crisis: Initiate IV Dilaudid PCA with settings of 0.5 mg, 10-minute lockout, and 3 mg/h Hold Toradol due to listed allergy IV fluids, 0.45% saline at 100 mL/h Monitor vital signs very closely, reevaluate pain scale regularly, and supplemental oxygen as needed. Patient will be reevaluated for pain in the context of functioning and relationship to baseline as his care progresses.  Chronic pain syndrome: Will restart home medications as pain intensity improves  Leukocytosis: WBCs 12.7.  Patient is afebrile.  COVID-19 and influenza tests negative.  Continue to follow closely without antibiotics.  Labs in AM.  Anemia of chronic disease: Patient's hemoglobin is 8.3 g/dL, consistent with his baseline.  There is no clinical indication for blood transfusion at this time.  Continue to monitor closely.  History of HIV: Well-controlled.  Followed by infectious disease as an outpatient.  We will continue antiviral medications.  History of pulmonary embolism: Will continue apixaban  Generalized anxiety disorder: Stable.  No suicidal homicidal intentions today.  Continue home medications as needed.  DVT Prophylaxis: Apixaban  and SCD  AM Labs Ordered, also please review Full Orders  Family Communication: Admission, patient's condition and plan of care including tests being ordered have been discussed with the patient who indicate understanding and agree with the plan and Code Status.  Code Status: Full Code  Consults called: None    Admission status: Inpatient    Time spent in minutes : 30 minutes  O'Brien, [name], [name] [facility] [address] [place],  [address] [phone]  03/14/2021 at 4:36 PM

## 2021-03-18 ENCOUNTER — Other Ambulatory Visit: Payer: Self-pay

## 2021-03-18 ENCOUNTER — Other Ambulatory Visit: Payer: Self-pay | Admitting: Family Medicine

## 2021-03-18 ENCOUNTER — Encounter: Payer: Self-pay | Admitting: Family Medicine

## 2021-03-18 ENCOUNTER — Other Ambulatory Visit (HOSPITAL_COMMUNITY): Payer: Self-pay

## 2021-03-18 ENCOUNTER — Ambulatory Visit (INDEPENDENT_AMBULATORY_CARE_PROVIDER_SITE_OTHER): Payer: Medicaid Other | Admitting: Family Medicine

## 2021-03-18 VITALS — BP 102/66 | HR 107 | Temp 99.1°F | Ht 75.0 in | Wt 139.5 lb

## 2021-03-18 DIAGNOSIS — F1190 Opioid use, unspecified, uncomplicated: Secondary | ICD-10-CM | POA: Diagnosis not present

## 2021-03-18 DIAGNOSIS — D571 Sickle-cell disease without crisis: Secondary | ICD-10-CM

## 2021-03-18 DIAGNOSIS — G8929 Other chronic pain: Secondary | ICD-10-CM | POA: Diagnosis not present

## 2021-03-18 DIAGNOSIS — I878 Other specified disorders of veins: Secondary | ICD-10-CM

## 2021-03-18 DIAGNOSIS — E559 Vitamin D deficiency, unspecified: Secondary | ICD-10-CM | POA: Diagnosis not present

## 2021-03-18 LAB — POCT URINALYSIS DIP (CLINITEK)
Blood, UA: NEGATIVE
Glucose, UA: NEGATIVE mg/dL
Ketones, POC UA: NEGATIVE mg/dL
Nitrite, UA: NEGATIVE
POC PROTEIN,UA: NEGATIVE
Spec Grav, UA: 1.015
Urobilinogen, UA: 2 U/dL — AB
pH, UA: 6

## 2021-03-18 MED ORDER — OXYCODONE-ACETAMINOPHEN 10-325 MG PO TABS
1.0000 | ORAL_TABLET | Freq: Four times a day (QID) | ORAL | 0 refills | Status: DC | PRN
  Filled 2021-03-18 (×2): qty 90, 23d supply, fill #0

## 2021-03-18 NOTE — Progress Notes (Signed)
Patient [facility]  Established Patient Office Visit  Subjective:  Patient ID: Martin [name], male    DOB: 03-01-1991  Age: 30 y.o. MRN: [id number]  CC:  Chief Complaint  Patient presents with   Follow-up    Pt 3 month FU no issues or concerns     HPI Martin [name] is a 30 year old male with a medical history significant for sickle cell disease, chronic pain syndrome, opiate dependence and tolerance, history of anemia of chronic disease, history of HIV, and history of pulmonary embolism on Eliquis presents for follow-up of chronic conditions and medication management.  Patient says that he feels well and is without complaint on today.  Patient is requesting replacement of Port-A-Cath.  He previously had a Port-A-Cath due to poor venous access that was removed around 1 year ago.  Patient had an infection that was considered to be port related at that time.  Patient was hospitalized on 03/10/2021 - 03/11/2021 for sickle cell pain crisis.  Pain crisis resolved with IV fluids and IV pain medications.  Patient says that pain has been well controlled on Percocet.  He last had Percocet this a.m. with moderate relief.  He currently denies any headache, chest pain, shortness of breath, fever, chills, nausea, vomiting, or diarrhea patient has a history of HIV.  HIV infection has been stable over the past several years.  He states that he takes all medications consistently and without interruption.  Patient follows up with infectious disease every 3 to 6 months. Patient also has a history of pulmonary embolism.  He has been taking Eliquis without interruption.  Past Medical History:  Diagnosis Date   Anxiety    HIV (human immunodeficiency virus infection) ([place])    Proteinuria    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[place])    Vitamin D deficiency 10/2018    Past Surgical History:  Procedure Laterality Date   IR IMAGING GUIDED PORT INSERTION  08/29/2019   IR  REMOVAL TUN ACCESS W/ PORT W/O FL MOD SED  08/30/2020   TEE WITHOUT CARDIOVERSION N/A 08/30/2020   Procedure: TRANSESOPHAGEAL ECHOCARDIOGRAM (TEE);  Surgeon: Pixie Casino, MD;  Location: [facility] ENDOSCOPY;  Service: Cardiovascular;  Laterality: N/A;    Family History  Problem Relation Age of Onset   Sickle cell trait Mother    Sickle cell trait Father    Birth defects Maternal Grandmother    Birth defects Paternal Grandmother     Social History   Socioeconomic History   Marital status: Single    Spouse name: Not on file   Number of children: Not on file   Years of education: Not on file   Highest education level: Not on file  Occupational History   Occupation: disabled  Tobacco Use   Smoking status: Never   Smokeless tobacco: Never  Vaping Use   Vaping Use: Some days   Substances: THC  Substance and Sexual Activity   Alcohol use: No   Drug use: Yes    Types: Marijuana    Comment: daily   Sexual activity: Yes    Birth control/protection: Condom  Other Topics Concern   Not on file  Social History Narrative   Not on file   Social Determinants of Health   Financial Resource Strain: Not on file  Food Insecurity: Not on file  Transportation Needs: Not on file  Physical Activity: Not on file  Stress: Not on file  Social Connections: Not on file  Intimate Partner  Violence: Not on file    Outpatient Medications Prior to Visit  Medication Sig Dispense Refill   abacavir-dolutegravir-lamiVUDine (TRIUMEQ) 600-50-300 MG tablet Take 1 tablet by mouth at bedtime.      acetaminophen (TYLENOL) 325 MG tablet Take 650 mg by mouth every 6 (six) hours as needed for pain or headache.     ALPRAZolam (XANAX) 1 MG tablet Take 1 mg by mouth 3 (three) times daily as needed for anxiety.     apixaban (ELIQUIS) 5 MG TABS tablet Take 5 mg by mouth 2 (two) times daily.     cetirizine (ZYRTEC ALLERGY) 10 MG tablet Take 1 tablet (10 mg total) by mouth daily. (Patient taking differently: Take 10  mg by mouth daily as needed for allergies.) 30 tablet 2   cyclobenzaprine (FLEXERIL) 10 MG tablet Take 10 mg by mouth 3 (three) times daily.     hydroxyurea (HYDREA) 500 MG capsule Take 4 capsules (2,000 mg total) by mouth at bedtime. 120 capsule 11   melatonin 3 MG TABS tablet Take 6 mg by mouth daily as needed for sleep.     Multiple Vitamin (MULTIVITAMIN WITH MINERALS) TABS tablet Take 1 tablet by mouth daily with breakfast.      naloxone (NARCAN) nasal spray 4 mg/0.1 mL Place 4 mg into the nose as needed for opioid reversal.     promethazine (PHENERGAN) 12.5 MG tablet Take 12.5 mg by mouth every 8 (eight) hours as needed for nausea/vomiting.     Vitamin D, Ergocalciferol, (DRISDOL) 1.25 MG (50000 UT) CAPS capsule Take 1 capsule (50,000 Units total) by mouth every 7 (seven) days. (Patient taking differently: Take 50,000 Units by mouth every Tuesday.) 5 capsule 3   oxyCODONE-acetaminophen (PERCOCET) 10-325 MG tablet Take 1 tablet by mouth every 6 (six) hours as needed for pain. 90 tablet 0   No facility-administered medications prior to visit.    Allergies  Allergen Reactions   Ketorolac Tromethamine Swelling and Other (See Comments)    Patient reports facial edema and left arm edema after administration.   Tape Rash and Other (See Comments)    PLEASE DO NOT USE THE CLEAR, THICK, "PLASTIC" TAPE- only paper tape is tolerated    Wound Dressing Adhesive Rash    ROS Review of Systems  Constitutional: Negative.   HENT: Negative.    Eyes: Negative.   Respiratory: Negative.    Gastrointestinal: Negative.   Genitourinary: Negative.   Musculoskeletal:  Positive for arthralgias and back pain.  Neurological: Negative.   Psychiatric/Behavioral: Negative.       Objective:    Physical Exam Constitutional:      Appearance: Normal appearance.  Eyes:     Pupils: Pupils are equal, round, and reactive to light.  Cardiovascular:     Rate and Rhythm: Normal rate and regular rhythm.      Pulses: Normal pulses.  Pulmonary:     Effort: Pulmonary effort is normal.  Abdominal:     General: Bowel sounds are normal.  Musculoskeletal:        General: Normal range of motion.  Skin:    General: Skin is warm.  Neurological:     General: No focal deficit present.     Mental Status: He is alert. Mental status is at baseline.    BP 102/66   Pulse (!) 107   Temp 99.1 F (37.3 C)   Wt 139 lb 8 oz (63.3 kg)   SpO2 91%   BMI 17.44 kg/m  Wt Readings from Last  3 Encounters:  03/18/21 139 lb 8 oz (63.3 kg)  03/11/21 152 lb 8.9 oz (69.2 kg)  01/13/21 150 lb (68 kg)     There are no preventive care reminders to display for this patient.   There are no preventive care reminders to display for this patient.  No results found for: TSH Lab Results  Component Value Date   WBC 13.9 (H) 03/18/2021   HGB 8.3 (L) 03/18/2021   HCT 22.8 (L) 03/18/2021   MCV 111 (H) 03/18/2021   PLT 361 03/18/2021   Lab Results  Component Value Date   NA 136 03/18/2021   K 6.8 (HH) 03/18/2021   CO2 17 (L) 03/18/2021   GLUCOSE 101 (H) 03/18/2021   BUN 11 03/18/2021   CREATININE 0.92 03/18/2021   BILITOT 10.2 (H) 03/18/2021   ALKPHOS 79 03/18/2021   AST 46 (H) 03/18/2021   ALT 15 03/18/2021   PROT 9.1 (H) 03/18/2021   ALBUMIN 4.2 03/18/2021   CALCIUM 8.8 03/18/2021   ANIONGAP 7 03/10/2021   EGFR 114 03/18/2021   No results found for: CHOL No results found for: HDL No results found for: LDLCALC No results found for: [name] No results found for: CHOLHDL Lab Results  Component Value Date   HGBA1C (L) 05/22/2009    <4.0 (NOTE) The ADA recommends the following therapeutic goal for glycemic control related to Hgb A1c measurement: Goal of therapy: <6.5 Hgb A1c  Reference: American Diabetes Association: Clinical Practice Recommendations 2010, Diabetes Care, 2010, 33: (Suppl  1).      Assessment & Plan:   Problem List Items Addressed This Visit       Other   Vitamin D deficiency    Relevant Orders   Sickle Cell Panel (Completed)   Other chronic pain   Relevant Medications   oxyCODONE-acetaminophen (PERCOCET) 10-325 MG tablet   Other Relevant Orders   [id number] 11+Oxyco+Alc+Crt-Bund (Completed)   Sickle Cell Panel (Completed)   Chronic, continuous use of opioids   Relevant Orders   [id number] 11+Oxyco+Alc+Crt-Bund (Completed)   Other Visit Diagnoses     Hb-SS disease without crisis ([place])    -  Primary   Relevant Medications   oxyCODONE-acetaminophen (PERCOCET) 10-325 MG tablet   Other Relevant Orders   POCT URINALYSIS DIP (CLINITEK) (Completed)   Poor venous access       Relevant Orders   Ambulatory referral to Interventional Radiology   IR IMAGING GUIDED PORT INSERTION       Meds ordered this encounter  Medications   oxyCODONE-acetaminophen (PERCOCET) 10-325 MG tablet    Sig: Take 1 tablet by mouth every 6 (six) hours as needed for pain.    Dispense:  90 tablet    Refill:  0    Order Specific Question:   Supervising Provider    Answer:   Angelica Chessman E [[id number]]   0. Other chronic pain  - [id number] 11+Oxyco+Alc+Crt-Bund - oxyCODONE-acetaminophen (PERCOCET) 10-325 MG tablet; Take 1 tablet by mouth every 6 (six) hours as needed for pain.  Dispense: 90 tablet; Refill: 0 - Sickle Cell Panel  2. Hb-SS disease without crisis ([place])  - oxyCODONE-acetaminophen (PERCOCET) 10-325 MG tablet; Take 1 tablet by mouth every 6 (six) hours as needed for pain.  Dispense: 90 tablet; Refill: 0 - POCT URINALYSIS DIP (CLINITEK)  3. Chronic, continuous use of opioids  - [id number] 11+Oxyco+Alc+Crt-Bund  4. Vitamin D deficiency  - Sickle Cell Panel  5. Poor venous access  - Ambulatory referral to Interventional Radiology - IR  IMAGING GUIDED PORT INSERTION; Future  Follow-up: Return in about 3 months (around 06/17/2021) for sickle cell anemia.    Donia Pounds  APRN, [name], [name] Patient [facility] [address] [place], [place] [address] [phone]

## 2021-03-19 LAB — CMP14+CBC/D/PLT+FER+RETIC+V...
ALT: 15 IU/L (ref 0–44)
AST: 46 IU/L — ABNORMAL HIGH (ref 0–40)
Albumin/Globulin Ratio: 0.9 — ABNORMAL LOW (ref 1.2–2.2)
Albumin: 4.2 g/dL (ref 4.0–5.0)
Alkaline Phosphatase: 79 IU/L (ref 44–121)
BUN/Creatinine Ratio: 12 (ref 9–20)
BUN: 11 mg/dL (ref 6–20)
Basophils Absolute: 0.1 10*3/uL (ref 0.0–0.2)
Basos: 0 %
Bilirubin Total: 10.2 mg/dL — ABNORMAL HIGH (ref 0.0–1.2)
CO2: 17 mmol/L — ABNORMAL LOW (ref 20–29)
Calcium: 8.8 mg/dL (ref 8.7–10.2)
Chloride: 101 mmol/L (ref 96–106)
Creatinine, Ser: 0.92 mg/dL (ref 0.76–1.27)
EOS (ABSOLUTE): 0.2 10*3/uL (ref 0.0–0.4)
Eos: 1 %
Ferritin: 1949 ng/mL — ABNORMAL HIGH (ref 30–400)
Globulin, Total: 4.9 g/dL — ABNORMAL HIGH (ref 1.5–4.5)
Glucose: 101 mg/dL — ABNORMAL HIGH (ref 70–99)
Hematocrit: 22.8 % — ABNORMAL LOW (ref 37.5–51.0)
Hemoglobin: 8.3 g/dL — ABNORMAL LOW (ref 13.0–17.7)
Immature Grans (Abs): 0.1 10*3/uL (ref 0.0–0.1)
Immature Granulocytes: 1 %
Lymphocytes Absolute: 6.1 10*3/uL — ABNORMAL HIGH (ref 0.7–3.1)
Lymphs: 44 %
MCH: 40.3 pg — ABNORMAL HIGH (ref 26.6–33.0)
MCHC: 36.4 g/dL — ABNORMAL HIGH (ref 31.5–35.7)
MCV: 111 fL — ABNORMAL HIGH (ref 79–97)
Monocytes Absolute: 1.1 10*3/uL — ABNORMAL HIGH (ref 0.1–0.9)
Monocytes: 8 %
NRBC: 7 % — ABNORMAL HIGH (ref 0–0)
Neutrophils Absolute: 6.3 10*3/uL (ref 1.4–7.0)
Neutrophils: 46 %
Platelets: 361 10*3/uL (ref 150–450)
Potassium: 6.8 mmol/L (ref 3.5–5.2)
RBC: 2.06 x10E6/uL — CL (ref 4.14–5.80)
RDW: 20.9 % — ABNORMAL HIGH (ref 11.6–15.4)
Retic Ct Pct: 17.3 % — ABNORMAL HIGH (ref 0.6–2.6)
Sodium: 136 mmol/L (ref 134–144)
Total Protein: 9.1 g/dL — ABNORMAL HIGH (ref 6.0–8.5)
Vit D, 25-Hydroxy: 13.6 ng/mL — ABNORMAL LOW (ref 30.0–100.0)
WBC: 13.9 10*3/uL — ABNORMAL HIGH (ref 3.4–10.8)
eGFR: 114 mL/min/{1.73_m2} (ref >59)

## 2021-03-21 ENCOUNTER — Telehealth: Payer: Self-pay

## 2021-03-21 NOTE — Telephone Encounter (Signed)
Transition Care Management Follow-up Telephone Call Date of discharge and from where: 03/11/2021  [facility] How have you been since you were released from the hospital? Doing well Any questions or concerns? No  Items Reviewed: Did the pt receive and understand the discharge instructions provided? Yes  Medications obtained and verified? Yes  Other? No  Any new allergies since your discharge? No  Dietary orders reviewed? Yes Do you have support at home? Yes   Home Care and Equipment/Supplies: Were home health services ordered? not applicable If so, what is the name of the agency?  Has the agency set up a time to come to the patient's home? not applicable Were any new equipment or medical supplies ordered?   What is the name of the medical supply agency?  Were you able to get the supplies/equipment? not applicable Do you have any questions related to the use of the equipment or supplies? No  Functional Questionnaire: (I = Independent and D = Dependent) ADLs: I  Bathing/Dressing- I  Meal Prep- I  Eating- I  Maintaining continence- I  Transferring/Ambulation- I  Managing Meds- I  Follow up appointments reviewed:  PCP Hospital f/u appt confirmed? Yes  Scheduled to see PCP on 11/29 Specialist Hospital f/u appt confirmed? No   Are transportation arrangements needed? No  If their condition worsens, is the pt aware to call PCP or go to the Emergency Dept.? Yes Was the patient provided with contact information for the PCP's office or ED? Yes Was to pt encouraged to call back with questions or concerns? Yes Rowe Pavy, RN, BSN, CEN [facility] [employer] [phone]

## 2021-03-23 LAB — DRUG SCREEN 764883 11+OXYCO+ALC+CRT-BUND
Amphetamines, Urine: NEGATIVE ng/mL
BENZODIAZ UR QL: NEGATIVE ng/mL
Barbiturate: NEGATIVE ng/mL
Cannabinoid Quant, Ur: NEGATIVE ng/mL
Cocaine (Metabolite): NEGATIVE ng/mL
Creatinine: 113.8 mg/dL (ref 20.0–300.0)
Ethanol: NEGATIVE %
Meperidine: NEGATIVE ng/mL
Methadone Screen, Urine: NEGATIVE ng/mL
Phencyclidine: NEGATIVE ng/mL
Propoxyphene: NEGATIVE ng/mL
Tramadol: NEGATIVE ng/mL
pH, Urine: 6.2 (ref 4.5–8.9)

## 2021-03-23 LAB — OXYCODONE/OXYMORPHONE, CONFIRM
OXYCODONE/OXYMORPH: POSITIVE — AB
OXYCODONE: 982 ng/mL
OXYCODONE: POSITIVE — AB
OXYMORPHONE (GC/MS): 1954 ng/mL
OXYMORPHONE: POSITIVE — AB

## 2021-03-23 LAB — OPIATES CONFIRMATION, URINE: Opiates: NEGATIVE ng/mL

## 2021-03-28 ENCOUNTER — Telehealth: Payer: Self-pay

## 2021-03-28 NOTE — Telephone Encounter (Signed)
-----   Message from Massie Maroon, [place] sent at 03/27/2021  4:20 PM EST ----- Regarding: RE: Humidifier What type of humidifier? What reason? Please have the patient give more details or schedule a virtual visit on Tuesday to discuss this further. Thank you.   [name]  APRN, [name], [name] [facility] [address] [place], [place] [address] [phone]   ----- Message ----- From: Eduard Clos, RMA Sent: 03/25/2021   1:45 PM EST To: Massie Maroon, [name] Subject: Humidifier                                     Patient left voicemail asking for a humidifier. Stated he called his insurance and was told you can write rx for him. Please advise, thank you.

## 2021-03-28 NOTE — Telephone Encounter (Signed)
Attempted to contact patient for virtual appointment per [place], voicemail not set up unable to reach patient.

## 2021-03-31 NOTE — Telephone Encounter (Signed)
Virtual appt scheduled for 12/13 1 pm.

## 2021-04-01 ENCOUNTER — Telehealth: Payer: Self-pay

## 2021-04-01 ENCOUNTER — Telehealth: Payer: Medicaid Other | Admitting: Family Medicine

## 2021-04-01 ENCOUNTER — Other Ambulatory Visit: Payer: Self-pay | Admitting: Family Medicine

## 2021-04-01 DIAGNOSIS — D571 Sickle-cell disease without crisis: Secondary | ICD-10-CM

## 2021-04-01 DIAGNOSIS — G8929 Other chronic pain: Secondary | ICD-10-CM

## 2021-04-01 MED ORDER — OXYCODONE-ACETAMINOPHEN 10-325 MG PO TABS: 1.0000 | ORAL_TABLET | Freq: Four times a day (QID) | ORAL | 0 refills | Status: DC | PRN

## 2021-04-01 NOTE — Progress Notes (Signed)
Reviewed PDMP substance reporting system prior to prescribing opiate medications. No inconsistencies noted.  Meds ordered this encounter  Medications   oxyCODONE-acetaminophen (PERCOCET) 10-325 MG tablet    Sig: Take 1 tablet by mouth every 6 (six) hours as needed for pain.    Dispense:  90 tablet    Refill:  0    Order Specific Question:   Supervising Provider    Answer:   JEGEDE, OLUGBEMIGA E [[id number]]   Martin Lalley Moore Serah Nicoletti  APRN, [name], [name] [facility] [address]  [place], [place] [address] [phone]  

## 2021-04-01 NOTE — Telephone Encounter (Signed)
Oxycodone  °

## 2021-04-03 ENCOUNTER — Other Ambulatory Visit: Payer: Self-pay | Admitting: Family Medicine

## 2021-04-03 DIAGNOSIS — D571 Sickle-cell disease without crisis: Secondary | ICD-10-CM

## 2021-04-03 DIAGNOSIS — G8929 Other chronic pain: Secondary | ICD-10-CM

## 2021-04-03 MED ORDER — OXYCODONE-ACETAMINOPHEN 10-325 MG PO TABS: 1.0000 | ORAL_TABLET | ORAL | 0 refills | Status: DC | PRN

## 2021-04-03 NOTE — Progress Notes (Signed)
Reviewed PDMP substance reporting system prior to prescribing opiate medications. No inconsistencies noted.  Meds ordered this encounter  Medications   oxyCODONE-acetaminophen (PERCOCET) 10-325 MG tablet    Sig: Take 1 tablet by mouth every 4 (four) hours as needed for pain.    Dispense:  90 tablet    Refill:  0    Order Specific Question:   Supervising Provider    Answer:   JEGEDE, OLUGBEMIGA E [[id number]]   Martin [name] Martin Trice  APRN, [name], [name] [facility] [address]  [place], [place] [address] [phone]  

## 2021-04-04 ENCOUNTER — Other Ambulatory Visit: Payer: Self-pay | Admitting: Student

## 2021-04-07 ENCOUNTER — Other Ambulatory Visit: Payer: Self-pay

## 2021-04-07 ENCOUNTER — Ambulatory Visit (HOSPITAL_COMMUNITY): Payer: Medicaid Other

## 2021-04-07 ENCOUNTER — Encounter (HOSPITAL_COMMUNITY): Payer: Self-pay

## 2021-04-07 ENCOUNTER — Emergency Department (HOSPITAL_COMMUNITY): Payer: Medicaid Other

## 2021-04-07 ENCOUNTER — Inpatient Hospital Stay (HOSPITAL_COMMUNITY): Admission: RE | Admit: 2021-04-07 | Payer: Medicaid Other | Source: Ambulatory Visit

## 2021-04-07 ENCOUNTER — Inpatient Hospital Stay (HOSPITAL_COMMUNITY)
Admission: EM | Admit: 2021-04-07 | Discharge: 2021-04-13 | DRG: 811 | Disposition: A | Discharge status: Home / Self Care | Payer: Medicaid Other | Attending: Internal Medicine | Admitting: Internal Medicine

## 2021-04-07 DIAGNOSIS — E559 Vitamin D deficiency, unspecified: Secondary | ICD-10-CM | POA: Diagnosis present

## 2021-04-07 DIAGNOSIS — K5900 Constipation, unspecified: Secondary | ICD-10-CM | POA: Diagnosis present

## 2021-04-07 DIAGNOSIS — K219 Gastro-esophageal reflux disease without esophagitis: Secondary | ICD-10-CM | POA: Diagnosis present

## 2021-04-07 DIAGNOSIS — Z86711 Personal history of pulmonary embolism: Secondary | ICD-10-CM | POA: Diagnosis not present

## 2021-04-07 DIAGNOSIS — B20 Human immunodeficiency virus [HIV] disease: Secondary | ICD-10-CM | POA: Diagnosis present

## 2021-04-07 DIAGNOSIS — Z21 Asymptomatic human immunodeficiency virus [HIV] infection status: Secondary | ICD-10-CM | POA: Diagnosis present

## 2021-04-07 DIAGNOSIS — D72828 Other elevated white blood cell count: Secondary | ICD-10-CM | POA: Diagnosis present

## 2021-04-07 DIAGNOSIS — Z8616 Personal history of COVID-19: Secondary | ICD-10-CM

## 2021-04-07 DIAGNOSIS — D638 Anemia in other chronic diseases classified elsewhere: Secondary | ICD-10-CM | POA: Diagnosis present

## 2021-04-07 DIAGNOSIS — D5700 Hb-SS disease with crisis, unspecified: Secondary | ICD-10-CM | POA: Diagnosis present

## 2021-04-07 DIAGNOSIS — D72829 Elevated white blood cell count, unspecified: Secondary | ICD-10-CM | POA: Diagnosis not present

## 2021-04-07 DIAGNOSIS — F411 Generalized anxiety disorder: Secondary | ICD-10-CM | POA: Diagnosis present

## 2021-04-07 DIAGNOSIS — G894 Chronic pain syndrome: Secondary | ICD-10-CM | POA: Diagnosis present

## 2021-04-07 DIAGNOSIS — Z832 Family history of diseases of the blood and blood-forming organs and certain disorders involving the immune mechanism: Secondary | ICD-10-CM | POA: Diagnosis not present

## 2021-04-07 DIAGNOSIS — Z7901 Long term (current) use of anticoagulants: Secondary | ICD-10-CM | POA: Diagnosis not present

## 2021-04-07 DIAGNOSIS — J9621 Acute and chronic respiratory failure with hypoxia: Secondary | ICD-10-CM | POA: Diagnosis present

## 2021-04-07 LAB — COMPREHENSIVE METABOLIC PANEL
ALT: 20 U/L (ref 0–44)
AST: 54 U/L — ABNORMAL HIGH (ref 15–41)
Albumin: 3.7 g/dL (ref 3.5–5.0)
Alkaline Phosphatase: 65 U/L (ref 38–126)
Anion gap: 6 (ref 5–15)
BUN: 10 mg/dL (ref 6–20)
CO2: 23 mmol/L (ref 22–32)
Calcium: 8.7 mg/dL — ABNORMAL LOW (ref 8.9–10.3)
Chloride: 107 mmol/L (ref 98–111)
Creatinine, Ser: 0.81 mg/dL (ref 0.61–1.24)
GFR, Estimated: >60 mL/min (ref >60)
Glucose, Bld: 89 mg/dL (ref 70–99)
Potassium: 4.2 mmol/L (ref 3.5–5.1)
Sodium: 136 mmol/L (ref 135–145)
Total Bilirubin: 7.1 mg/dL — ABNORMAL HIGH (ref 0.3–1.2)
Total Protein: 9 g/dL — ABNORMAL HIGH (ref 6.5–8.1)

## 2021-04-07 LAB — CBC WITH DIFFERENTIAL/PLATELET
Abs Immature Granulocytes: 0.11 10*3/uL — ABNORMAL HIGH (ref 0.00–0.07)
Basophils Absolute: 0.1 10*3/uL (ref 0.0–0.1)
Basophils Relative: 0 %
Eosinophils Absolute: 0.2 10*3/uL (ref 0.0–0.5)
Eosinophils Relative: 1 %
HCT: 23.9 % — ABNORMAL LOW (ref 39.0–52.0)
Hemoglobin: 9.2 g/dL — ABNORMAL LOW (ref 13.0–17.0)
Immature Granulocytes: 1 %
Lymphocytes Relative: 44 %
Lymphs Abs: 8 10*3/uL — ABNORMAL HIGH (ref 0.7–4.0)
MCH: 39.5 pg — ABNORMAL HIGH (ref 26.0–34.0)
MCHC: 38.5 g/dL — ABNORMAL HIGH (ref 30.0–36.0)
MCV: 102.6 fL — ABNORMAL HIGH (ref 80.0–100.0)
Monocytes Absolute: 1.5 10*3/uL — ABNORMAL HIGH (ref 0.1–1.0)
Monocytes Relative: 8 %
Neutro Abs: 8.2 10*3/uL — ABNORMAL HIGH (ref 1.7–7.7)
Neutrophils Relative %: 46 %
Platelets: 422 10*3/uL — ABNORMAL HIGH (ref 150–400)
RBC: 2.33 MIL/uL — ABNORMAL LOW (ref 4.22–5.81)
RDW: 21.8 % — ABNORMAL HIGH (ref 11.5–15.5)
WBC: 18 10*3/uL — ABNORMAL HIGH (ref 4.0–10.5)
nRBC: 15.2 % — ABNORMAL HIGH (ref 0.0–0.2)

## 2021-04-07 LAB — RESP PANEL BY RT-PCR (FLU A&B, COVID) ARPGX2
Influenza A by PCR: NEGATIVE
Influenza B by PCR: NEGATIVE
SARS Coronavirus 2 by RT PCR: NEGATIVE

## 2021-04-07 LAB — RETICULOCYTES
Immature Retic Fract: 48 % — ABNORMAL HIGH (ref 2.3–15.9)
RBC.: 2.25 MIL/uL — ABNORMAL LOW (ref 4.22–5.81)
Retic Count, Absolute: 499.5 10*3/uL — ABNORMAL HIGH (ref 19.0–186.0)
Retic Ct Pct: 22.2 % — ABNORMAL HIGH (ref 0.4–3.1)

## 2021-04-07 MED ORDER — APIXABAN 5 MG PO TABS
5.0000 mg | ORAL_TABLET | Freq: Two times a day (BID) | ORAL | Status: DC
  Administered 2021-04-07 – 2021-04-13 (×12): 5 mg via ORAL
  Filled 2021-04-07 (×12): qty 1

## 2021-04-07 MED ORDER — ABACAVIR-DOLUTEGRAVIR-LAMIVUD 600-50-300 MG PO TABS
1.0000 | ORAL_TABLET | Freq: Every day | ORAL | Status: DC
  Administered 2021-04-07 – 2021-04-12 (×6): 1 via ORAL
  Filled 2021-04-07 (×7): qty 1

## 2021-04-07 MED ORDER — SODIUM CHLORIDE 0.45 % IV SOLN: INTRAVENOUS | Status: DC

## 2021-04-07 MED ORDER — HYDROMORPHONE HCL 2 MG/ML IJ SOLN
2.0000 mg | Freq: Once | INTRAMUSCULAR | Status: AC
  Administered 2021-04-07: 11:00:00 2 mg via INTRAVENOUS
  Filled 2021-04-07: qty 1

## 2021-04-07 MED ORDER — ONDANSETRON HCL 4 MG/2ML IJ SOLN: 4.0000 mg | INTRAMUSCULAR | Status: DC | PRN

## 2021-04-07 MED ORDER — HYDROXYUREA 500 MG PO CAPS
2000.0000 mg | ORAL_CAPSULE | Freq: Every day | ORAL | Status: DC
  Administered 2021-04-07 – 2021-04-12 (×6): 2000 mg via ORAL
  Filled 2021-04-07 (×10): qty 4

## 2021-04-07 MED ORDER — CYCLOBENZAPRINE HCL 10 MG PO TABS
10.0000 mg | ORAL_TABLET | Freq: Three times a day (TID) | ORAL | Status: DC
  Administered 2021-04-07 – 2021-04-13 (×17): 10 mg via ORAL
  Filled 2021-04-07 (×17): qty 1

## 2021-04-07 MED ORDER — HYDROMORPHONE HCL 2 MG/ML IJ SOLN
2.0000 mg | INTRAMUSCULAR | Status: DC | PRN
  Administered 2021-04-07 (×2): 2 mg via INTRAVENOUS
  Filled 2021-04-07 (×2): qty 1

## 2021-04-07 MED ORDER — ONDANSETRON HCL 4 MG/2ML IJ SOLN
4.0000 mg | Freq: Four times a day (QID) | INTRAMUSCULAR | Status: DC | PRN
  Administered 2021-04-11: 23:00:00 4 mg via INTRAVENOUS
  Filled 2021-04-07: qty 2

## 2021-04-07 MED ORDER — DIPHENHYDRAMINE HCL 50 MG/ML IJ SOLN
25.0000 mg | Freq: Once | INTRAMUSCULAR | Status: AC
  Administered 2021-04-07: 09:00:00 25 mg via INTRAVENOUS
  Filled 2021-04-07: qty 1

## 2021-04-07 MED ORDER — HYDROMORPHONE HCL 2 MG/ML IJ SOLN
2.0000 mg | INTRAMUSCULAR | Status: AC
  Administered 2021-04-07: 09:00:00 2 mg via INTRAVENOUS

## 2021-04-07 MED ORDER — HYDROMORPHONE HCL 2 MG/ML IJ SOLN
2.0000 mg | INTRAMUSCULAR | Status: DC
  Filled 2021-04-07: qty 1

## 2021-04-07 MED ORDER — HYDROMORPHONE 1 MG/ML IV SOLN
INTRAVENOUS | Status: DC
  Administered 2021-04-07: 30 mg via INTRAVENOUS
  Administered 2021-04-07: 2 mg via INTRAVENOUS
  Administered 2021-04-07: 5.5 mg via INTRAVENOUS
  Administered 2021-04-08 (×2): 4.5 mg via INTRAVENOUS
  Administered 2021-04-08: 5.5 mg via INTRAVENOUS
  Administered 2021-04-08: 4.5 mg via INTRAVENOUS
  Administered 2021-04-08: 5.5 mg via INTRAVENOUS
  Administered 2021-04-09: 2.5 mg via INTRAVENOUS
  Administered 2021-04-09: 30 mg via INTRAVENOUS
  Administered 2021-04-09: 1.5 mg via INTRAVENOUS
  Administered 2021-04-09: 6.5 mg via INTRAVENOUS
  Administered 2021-04-09: 8 mg via INTRAVENOUS
  Administered 2021-04-09: 5 mg via INTRAVENOUS
  Administered 2021-04-09: 7 mg via INTRAVENOUS
  Administered 2021-04-10: 3 mg via INTRAVENOUS
  Administered 2021-04-10: 4 mg via INTRAVENOUS
  Administered 2021-04-10: 4.5 mg via INTRAVENOUS
  Administered 2021-04-10: 4 mg via INTRAVENOUS
  Administered 2021-04-10: 3 mg via INTRAVENOUS
  Administered 2021-04-10: 2.5 mg via INTRAVENOUS
  Administered 2021-04-11: 4 mg via INTRAVENOUS
  Administered 2021-04-11: 3.5 mg via INTRAVENOUS
  Administered 2021-04-11: 9 mg via INTRAVENOUS
  Administered 2021-04-11: 2.56 mg via INTRAVENOUS
  Administered 2021-04-11: 30 mg via INTRAVENOUS
  Administered 2021-04-11: 1.5 mg via INTRAVENOUS
  Administered 2021-04-12 (×2): 30 mg via INTRAVENOUS
  Administered 2021-04-12: 12 mg via INTRAVENOUS
  Administered 2021-04-12: 5 mg via INTRAVENOUS
  Administered 2021-04-12: 0 mg via INTRAVENOUS
  Administered 2021-04-12: 3.5 mg via INTRAVENOUS
  Administered 2021-04-12: 4.5 mg via INTRAVENOUS
  Administered 2021-04-12: 7 mg via INTRAVENOUS
  Administered 2021-04-13: 2.5 mg via INTRAVENOUS
  Administered 2021-04-13: 30 mg via INTRAVENOUS
  Administered 2021-04-13: 1 mg via INTRAVENOUS
  Administered 2021-04-13: 2 mg via INTRAVENOUS
  Filled 2021-04-07 (×6): qty 30

## 2021-04-07 MED ORDER — SENNOSIDES-DOCUSATE SODIUM 8.6-50 MG PO TABS
1.0000 | ORAL_TABLET | Freq: Two times a day (BID) | ORAL | Status: DC
  Administered 2021-04-08 – 2021-04-13 (×11): 1 via ORAL
  Filled 2021-04-07 (×12): qty 1

## 2021-04-07 MED ORDER — SODIUM CHLORIDE 0.9% FLUSH: 9.0000 mL | INTRAVENOUS | Status: DC | PRN

## 2021-04-07 MED ORDER — ALPRAZOLAM 1 MG PO TABS: 1.0000 mg | ORAL_TABLET | Freq: Three times a day (TID) | ORAL | Status: DC | PRN

## 2021-04-07 MED ORDER — POLYETHYLENE GLYCOL 3350 17 G PO PACK
17.0000 g | PACK | Freq: Every day | ORAL | Status: DC | PRN
  Administered 2021-04-08: 12:00:00 17 g via ORAL
  Filled 2021-04-07: qty 1

## 2021-04-07 MED ORDER — NALOXONE HCL 0.4 MG/ML IJ SOLN: 0.4000 mg | INTRAMUSCULAR | Status: DC | PRN

## 2021-04-07 MED ORDER — HYDROMORPHONE HCL 2 MG/ML IJ SOLN
2.0000 mg | Freq: Once | INTRAMUSCULAR | Status: AC
  Administered 2021-04-07: 13:00:00 2 mg via INTRAVENOUS
  Filled 2021-04-07: qty 1

## 2021-04-07 MED ORDER — DIPHENHYDRAMINE HCL 25 MG PO CAPS
25.0000 mg | ORAL_CAPSULE | ORAL | Status: DC | PRN
  Administered 2021-04-11 – 2021-04-13 (×2): 25 mg via ORAL
  Filled 2021-04-07 (×4): qty 1

## 2021-04-07 MED ORDER — SODIUM CHLORIDE 0.9 % IV SOLN
25.0000 mg | INTRAVENOUS | Status: DC | PRN
  Administered 2021-04-07 – 2021-04-13 (×15): 25 mg via INTRAVENOUS
  Filled 2021-04-07 (×2): qty 25
  Filled 2021-04-07: qty 0.5
  Filled 2021-04-07 (×5): qty 25
  Filled 2021-04-07 (×2): qty 0.5
  Filled 2021-04-07 (×2): qty 25
  Filled 2021-04-07: qty 0.5
  Filled 2021-04-07 (×3): qty 25
  Filled 2021-04-07 (×2): qty 0.5

## 2021-04-07 NOTE — ED Notes (Signed)
Pt stuck twice by this nurse attempting peripheral IV. Pt with large left AC vein however adamant he does not want to be stuck there. This nurse notified primary nurse, Dahlia Client, RN.

## 2021-04-07 NOTE — H&P (Signed)
H&P  Patient Demographics:  Martin [name], is a 30 y.o. male  MRN: [id number]   DOB - February 26, 1991  Admit Date - 04/07/2021  Outpatient Primary MD for the patient is Massie Maroon, [name]  Chief Complaint  Patient presents with   Sickle Cell Pain Crisis     HPI:   Martin [name]  is a 30 y.o. male with a medical history significant for sickle cell disease, chronic pain syndrome, opiate dependence and tolerance, stable HIV disease on medication, history of pulmonary embolism on Eliquis and anemia of chronic disease who came to the emergency room today with major complaints of generalized body pain that is consistent with his typical sickle cell pain crisis.  Patient claims to be in he usual state of health until he started feeling sickle cell related pain all over, he took his home medications with no sustained relief.  He said his pain is more significant in his lower back and lower extremities.  He rates his pain at 10/10 at the time of this encounter.  While in the emergency room, patient was hypoxic with oxygen saturation around 85%.  Patient is on home oxygen but does not use it consistently.  He denies any shortness of breath, dizziness, fever, chills, nausea, vomiting or diarrhea.  No urinary symptoms.  He denies any sick contacts, recent travels, or any known exposure to COVID-19.  ER course: His vitals showed: BP 114/75    Pulse 90    Temp 98 F (36.7 C) (Oral)    Resp 14    Ht 6\' 3"  (1.905 m)    Wt 68 kg    SpO2 93%    BMI 18.75 kg/m occasional desaturation to 85% on room air but improved to 93 and above on 3 L of oxygen.  His labs show leukocytosis of 18 which is an increase from previous results.  Comprehensive metabolic panel is unremarkable, increased reticulocyte count to 22.2%, respiratory panel was negative for COVID-19, influenza A and B.  Checks x-ray was unremarkable.  After multiple doses of IV Dilaudid and other adjunct therapy in the emergency room, pain remains largely  uncontrolled, patient will be admitted to the hospital for further evaluation and management of sickle cell pain crisis.   Review of systems:  In addition to the HPI above, patient reports No fever or chills No Headache, No changes with vision or hearing No problems swallowing food or liquids No chest pain, cough or shortness of breath No abdominal pain, No nausea or vomiting, Bowel movements are regular No blood in stool or urine No dysuria No new skin rashes or bruises No new joints pains-aches No new weakness, tingling, numbness in any extremity No recent weight gain or loss No polyuria, polydypsia or polyphagia No significant Mental Stressors  A full 10 point Review of Systems was done, except as stated above, all other Review of Systems were negative.  With Past History of the following :   Past Medical History:  Diagnosis Date   Anxiety    HIV (human immunodeficiency virus infection) (HCC)    Proteinuria    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HCC)    Vitamin D deficiency 10/2018      Past Surgical History:  Procedure Laterality Date   IR IMAGING GUIDED PORT INSERTION  08/29/2019   IR REMOVAL TUN ACCESS W/ PORT W/O FL MOD SED  08/30/2020   TEE WITHOUT CARDIOVERSION N/A 08/30/2020   Procedure: TRANSESOPHAGEAL ECHOCARDIOGRAM (TEE);  Surgeon: 09/01/2020, MD;  Location: [facility]  ENDOSCOPY;  Service: Cardiovascular;  Laterality: N/A;     Social History:   Social History   Tobacco Use   Smoking status: Never   Smokeless tobacco: Never  Substance Use Topics   Alcohol use: No     Lives - At home   Family History :   Family History  Problem Relation Age of Onset   Sickle cell trait Mother    Sickle cell trait Father    Birth defects Maternal Grandmother    Birth defects Paternal Grandmother      Home Medications:   Prior to Admission medications   Medication Sig Start Date End Date Taking? Authorizing Provider  abacavir-dolutegravir-lamiVUDine (TRIUMEQ) 600-50-300 MG  tablet Take 1 tablet by mouth at bedtime.     [provider]  acetaminophen (TYLENOL) 325 MG tablet Take 650 mg by mouth every 6 (six) hours as needed for pain or headache. 11/28/20   [provider]  ALPRAZolam Duanne Moron) 1 MG tablet Take 1 mg by mouth 3 (three) times daily as needed for anxiety.    [provider]  apixaban (ELIQUIS) 5 MG TABS tablet Take 5 mg by mouth 2 (two) times daily.    [provider]  cetirizine (ZYRTEC ALLERGY) 10 MG tablet Take 1 tablet (10 mg total) by mouth daily. Patient taking differently: Take 10 mg by mouth daily as needed for allergies. 08/21/20 08/21/21  [name], [name]  cyclobenzaprine (FLEXERIL) 10 MG tablet Take 10 mg by mouth 3 (three) times daily. 03/04/21   [provider]  hydroxyurea (HYDREA) 500 MG capsule Take 4 capsules (2,000 mg total) by mouth at bedtime. 10/07/20   [name], [name]  melatonin 3 MG TABS tablet Take 6 mg by mouth daily as needed for sleep. 03/04/21   [provider]  Multiple Vitamin (MULTIVITAMIN WITH MINERALS) TABS tablet Take 1 tablet by mouth daily with breakfast.     [provider]  naloxone (NARCAN) nasal spray 4 mg/0.1 mL Place 4 mg into the nose as needed for opioid reversal. 05/19/17   [provider]  oxyCODONE-acetaminophen (PERCOCET) 10-325 MG tablet Take 1 tablet by mouth every 4 (four) hours as needed for pain. 04/03/21 04/03/22  [name], [name]  promethazine (PHENERGAN) 12.5 MG tablet Take 12.5 mg by mouth every 8 (eight) hours as needed for nausea/vomiting. 02/04/21   [provider]  Vitamin D, Ergocalciferol, (DRISDOL) 1.25 MG (50000 UT) CAPS capsule Take 1 capsule (50,000 Units total) by mouth every 7 (seven) days. Patient taking differently: Take 50,000 Units by mouth every Tuesday. 11/01/18   Azzie Glatter, [name]     Allergies:   Allergies  Allergen Reactions   Ketorolac Tromethamine Swelling and Other (See Comments)     Patient reports facial edema and left arm edema after administration.   Tape Rash and Other (See Comments)    PLEASE DO NOT USE THE CLEAR, THICK, "PLASTIC" TAPE- only paper tape is tolerated    Wound Dressing Adhesive Rash     Physical Exam:   Vitals:   Vitals:   04/07/21 1300 04/07/21 1330  BP: 107/74 114/75  Pulse: 96 90  Resp: 13 14  Temp:    SpO2: 94% 93%    Physical Exam: Constitutional: Patient appears well-developed and well-nourished. Not in obvious distress. HENT: Normocephalic, atraumatic, External right and left ear normal. Oropharynx is clear and moist.  Eyes: Conjunctivae and EOM are normal. PERRLA, no scleral icterus. Neck: Normal ROM. Neck supple. No  JVD. No tracheal deviation. No thyromegaly. CVS: RRR, S1/S2 +, no murmurs, no gallops, no carotid bruit.  Pulmonary: Effort and breath sounds normal, no stridor, rhonchi, wheezes, rales.  Abdominal: Soft. BS +, no distension, tenderness, rebound or guarding.  Musculoskeletal: Normal range of motion. No edema and no tenderness.  Lymphadenopathy: No lymphadenopathy noted, cervical, inguinal or axillary Neuro: Alert. Normal reflexes, muscle tone coordination. No cranial nerve deficit. Skin: Skin is warm and dry. No rash noted. Not diaphoretic. No erythema. No pallor. Psychiatric: Normal mood and affect. Behavior, judgment, thought content normal.   Data Review:   CBC Recent Labs  Lab 04/07/21 0920  WBC 18.0*  HGB 9.2*  HCT 23.9*  PLT 422*  MCV 102.6*  MCH 39.5*  MCHC 38.5*  RDW 21.8*  LYMPHSABS 8.0*  MONOABS 1.5*  EOSABS 0.2  BASOSABS 0.1   ------------------------------------------------------------------------------------------------------------------  Chemistries  Recent Labs  Lab 04/07/21 0920  NA 136  K 4.2  CL 107  CO2 23  GLUCOSE 89  BUN 10  CREATININE 0.81  CALCIUM 8.7*  AST 54*  ALT 20  ALKPHOS 65  BILITOT 7.1*    ------------------------------------------------------------------------------------------------------------------ estimated creatinine clearance is 127.1 mL/min (by C-G formula based on SCr of 0.81 mg/dL). ------------------------------------------------------------------------------------------------------------------ No results for input(s): TSH, T4TOTAL, T3FREE, THYROIDAB in the last 72 hours.  Invalid input(s): FREET3  Coagulation profile No results for input(s): INR, PROTIME in the last 168 hours. ------------------------------------------------------------------------------------------------------------------- No results for input(s): DDIMER in the last 72 hours. -------------------------------------------------------------------------------------------------------------------  Cardiac Enzymes No results for input(s): CKMB, TROPONINI, MYOGLOBIN in the last 168 hours.  Invalid input(s): CK ------------------------------------------------------------------------------------------------------------------ No results found for: BNP  ---------------------------------------------------------------------------------------------------------------  Urinalysis    Component Value Date/Time   COLORURINE YELLOW 08/29/2020 [place] 08/29/2020 0245   LABSPEC 1.013 08/29/2020 0245   PHURINE 7.0 08/29/2020 [place] 08/29/2020 0245   HGBUR NEGATIVE 08/29/2020 0245   BILIRUBINUR small (A) 03/18/2021 1218   BILIRUBINUR neg 11/27/2019 1149   KETONESUR negative 03/18/2021 [place] 08/29/2020 0245   PROTEINUR NEGATIVE 08/29/2020 0245   UROBILINOGEN 2.0 (A) 03/18/2021 1218   UROBILINOGEN 4.0 (H) 05/21/2009 1944   NITRITE Negative 03/18/2021 1218   NITRITE NEGATIVE 08/29/2020 0245   LEUKOCYTESUR Trace (A) 03/18/2021 1218   LEUKOCYTESUR TRACE (A) 08/29/2020 0245     ----------------------------------------------------------------------------------------------------------------   Imaging Results:    DG Chest Portable 1 View  Result Date: 04/07/2021 CLINICAL DATA:  Shortness of breath EXAM: PORTABLE CHEST 1 VIEW COMPARISON:  Chest radiograph dated March 10, 2021. FINDINGS: The heart size and mediastinal contours are within normal limits. Prominent interstitial markings concerning for multifocal area of bronchial thickening/scarring. No focal consolidation or pleural effusion. The visualized skeletal structures are unremarkable. IMPRESSION: 1.  No focal consolidation or pleural effusion. 2. Multifocal scarring/bronchial thickening in this patient with history of sickle cell disease, unchanged. Electronically Signed   By: Keane Police D.O.   On: 04/07/2021 11:11     Assessment & Plan:  Principal Problem:   Sickle cell anemia with crisis ([place]) Active Problems:   Generalized anxiety disorder   Human immunodeficiency virus (HIV) disease ([place])   History of pulmonary embolus (PE)   Leukocytosis  Hb Sickle Cell Disease with crisis: Admit patient, start IVF D0.45% Saline @ 125 mls/hour, start weight based Dilaudid PCA, start IV Toradol 15 mg Q 6 H for a total of 5 days, Restart oral home pain medications, Monitor vitals very closely, Re-evaluate pain scale regularly, 2  L of Oxygen by [place], Patient will be re-evaluated for pain in the context of function and relationship to baseline as care progresses. Leukocytosis: Most likely due to sickle cell VOC, will monitor closely but no clinical indication for antibiotic therapy at this time. Anemia of Chronic Disease: Hgb is largely stable at baseline today, no clinical indication for blood transfusion at this time. Continue to monitor closely and transfuse as appropriate. Chronic pain Syndrome: Restart and continue oral home pain medications. HIV Disease: Stable. Continue antiretroviral medications. Follow up with  [facility] as outpatient. Generalized Anxiety Disorder: Stable. Patient denies any suicidal ideation or thoughts. Continue home medications. History of Pulmonary Embolism: On Eliquis. Will continue.  DVT Prophylaxis: Subcut Lovenox   AM Labs Ordered, also please review Full Orders  Family Communication: Admission, patient's condition and plan of care including tests being ordered have been discussed with the patient who indicate understanding and agree with the plan and Code Status.  Code Status: Full Code  Consults called: None    Admission status: Inpatient    Time spent in minutes : 50 minutes  Angelica Chessman MD, MHA, CPE, FACP 04/07/2021 at 1:34 PM

## 2021-04-07 NOTE — Discharge Instructions (Signed)
Return if worse

## 2021-04-07 NOTE — ED Provider Notes (Signed)
[facility] DEPT Provider Note   CSN: [id number] Arrival date & time: 04/07/21  0703     History Chief Complaint  Patient presents with   Sickle Cell Pain Crisis    Martin [name] is a 30 y.o. male.   Sickle Cell Pain Crisis Associated symptoms: no chest pain, no cough, no fever, no shortness of breath, no sore throat and no vomiting    Patient with history of sickle cell disease, HIV, previous PE on Eliquis uninterrupted presents due to sickle cell pain.  Reports he is having pain in his lower back, started acutely last night.  The change in weather appears to be the exacerbating factor.  He denies missing any doses of his home medicine, does not feel short of breath or having any chest pain.    He is on 3 L of oxygen at home due to pulmonary hypertension, has not had to increase overnight.  Past Medical History:  Diagnosis Date   Anxiety    HIV (human immunodeficiency virus infection) ([place])    Proteinuria    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[place])    Vitamin D deficiency 10/2018    Patient Active Problem List   Diagnosis Date Noted   Bacteremia due to Enterococcus 08/29/2020   Acute chest syndrome ([place]) 08/26/2020   Hypoxia 07/09/2020   COVID-19 05/13/2020   Sickle cell anemia with pain ([place]) 03/18/2020   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   Abnormal penile discharge, without blood 02/22/2020   Leukocytosis 01/02/2020   Anxiety 11/27/2019   Proteinuria 11/27/2019   Acute on chronic respiratory failure ([place]) 11/16/2019   Chronic, continuous use of opioids 08/15/2019   Seasonal allergies 08/15/2019   Chest congestion    Sickle cell anemia with crisis ([place]) 08/04/2019   History of pulmonary embolus (PE) 08/04/2019   Single subsegmental pulmonary embolism without acute cor pulmonale ([place]) 02/10/2019   Acute chest syndrome due to sickle cell crisis ([place]) 02/10/2019   Vaso-occlusive pain due to sickle cell disease ([place]) 03/09/2018   Bone pain  03/07/2018   Hip pain 03/07/2018   Acute bronchitis due to Streptococcus 02/21/2018   Heart murmur 02/03/2017   Sickle cell disease with crisis ([place]) 02/02/2017   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   Vitamin D deficiency 08/14/2016   Sickle cell pain crisis ([place]) 02/12/2016   High risk medication use 09/27/2014   Generalized anxiety disorder 05/19/2014   Gastro-esophageal reflux disease without esophagitis 05/19/2014   Marijuana use 11/07/2012   Human immunodeficiency virus (HIV) disease ([place]) 05/23/2012    Past Surgical History:  Procedure Laterality Date   IR IMAGING GUIDED PORT INSERTION  08/29/2019   IR REMOVAL TUN ACCESS W/ PORT W/O FL MOD SED  08/30/2020   TEE WITHOUT CARDIOVERSION N/A 08/30/2020   Procedure: TRANSESOPHAGEAL ECHOCARDIOGRAM (TEE);  Surgeon: Pixie Casino, MD;  Location: [facility] ENDOSCOPY;  Service: Cardiovascular;  Laterality: N/A;       Family History  Problem Relation Age of Onset   Sickle cell trait Mother    Sickle cell trait Father    Birth defects Maternal Grandmother    Birth defects Paternal Grandmother     Social History   Tobacco Use   Smoking status: Never   Smokeless tobacco: Never  Vaping Use   Vaping Use: Never used  Substance Use Topics   Alcohol use: No   Drug use: Yes    Types: Marijuana    Comment: 2x week    Home Medications Prior to Admission medications  Medication Sig Start Date End Date Taking? Authorizing Provider  abacavir-dolutegravir-lamiVUDine (TRIUMEQ) 600-50-300 MG tablet Take 1 tablet by mouth at bedtime.     [provider]  acetaminophen (TYLENOL) 325 MG tablet Take 650 mg by mouth every 6 (six) hours as needed for pain or headache. 11/28/20   [provider]  ALPRAZolam Duanne Moron) 1 MG tablet Take 1 mg by mouth 3 (three) times daily as needed for anxiety.    [provider]  apixaban (ELIQUIS) 5 MG TABS tablet Take 5 mg by mouth 2 (two) times daily.    [provider]   cetirizine (ZYRTEC ALLERGY) 10 MG tablet Take 1 tablet (10 mg total) by mouth daily. Patient taking differently: Take 10 mg by mouth daily as needed for allergies. 08/21/20 08/21/21  [name], [name]  cyclobenzaprine (FLEXERIL) 10 MG tablet Take 10 mg by mouth 3 (three) times daily. 03/04/21   [provider]  hydroxyurea (HYDREA) 500 MG capsule Take 4 capsules (2,000 mg total) by mouth at bedtime. 10/07/20   [name], [name]  melatonin 3 MG TABS tablet Take 6 mg by mouth daily as needed for sleep. 03/04/21   [provider]  Multiple Vitamin (MULTIVITAMIN WITH MINERALS) TABS tablet Take 1 tablet by mouth daily with breakfast.     [provider]  naloxone (NARCAN) nasal spray 4 mg/0.1 mL Place 4 mg into the nose as needed for opioid reversal. 05/19/17   [provider]  oxyCODONE-acetaminophen (PERCOCET) 10-325 MG tablet Take 1 tablet by mouth every 4 (four) hours as needed for pain. 04/03/21 04/03/22  [name], [name]  promethazine (PHENERGAN) 12.5 MG tablet Take 12.5 mg by mouth every 8 (eight) hours as needed for nausea/vomiting. 02/04/21   [provider]  Vitamin D, Ergocalciferol, (DRISDOL) 1.25 MG (50000 UT) CAPS capsule Take 1 capsule (50,000 Units total) by mouth every 7 (seven) days. Patient taking differently: Take 50,000 Units by mouth every Tuesday. 11/01/18   Azzie Glatter, [name]    Allergies    Ketorolac tromethamine, Tape, and Wound dressing adhesive  Review of Systems   Review of Systems  Constitutional:  Negative for chills and fever.  HENT:  Negative for ear pain and sore throat.   Eyes:  Negative for pain and visual disturbance.  Respiratory:  Negative for cough and shortness of breath.   Cardiovascular:  Negative for chest pain and palpitations.  Gastrointestinal:  Negative for abdominal pain and vomiting.  Genitourinary:  Negative for dysuria and hematuria.  Musculoskeletal:  Positive for myalgias. Negative  for arthralgias and back pain.  Skin:  Negative for color change and rash.  Neurological:  Negative for seizures and syncope.  All other systems reviewed and are negative.  Physical Exam Updated Vital Signs BP 114/75    Pulse 90    Temp 98 F (36.7 C) (Oral)    Resp 14    Ht 6\' 3"  (1.905 m)    Wt 68 kg    SpO2 93%    BMI 18.75 kg/m   Physical Exam Vitals and nursing note reviewed. Exam conducted with a chaperone present.  Constitutional:      Appearance: Normal appearance.  HENT:     Head: Normocephalic and atraumatic.  Eyes:     General: No scleral icterus.       Right eye: No discharge.        Left eye: No discharge.     Extraocular Movements: Extraocular movements intact.  Pupils: Pupils are equal, round, and reactive to light.  Cardiovascular:     Rate and Rhythm: Normal rate and regular rhythm.     Pulses: Normal pulses.     Heart sounds:    No friction rub. No gallop.  Pulmonary:     Effort: Pulmonary effort is normal. No respiratory distress.     Breath sounds: Normal breath sounds.  Abdominal:     General: Abdomen is flat. Bowel sounds are normal. There is no distension.     Palpations: Abdomen is soft.     Tenderness: There is no abdominal tenderness.  Skin:    General: Skin is warm and dry.     Coloration: Skin is not jaundiced.  Neurological:     Mental Status: He is alert. Mental status is at baseline.     Coordination: Coordination normal.   ED Results / Procedures / Treatments   Labs (all labs ordered are listed, but only abnormal results are displayed) Labs Reviewed  CBC WITH DIFFERENTIAL/PLATELET - Abnormal; Notable for the following components:      Result Value   WBC 18.0 (*)    RBC 2.33 (*)    Hemoglobin 9.2 (*)    HCT 23.9 (*)    MCV 102.6 (*)    MCH 39.5 (*)    MCHC 38.5 (*)    RDW 21.8 (*)    Platelets 422 (*)    nRBC 15.2 (*)    Neutro Abs 8.2 (*)    Lymphs Abs 8.0 (*)    Monocytes Absolute 1.5 (*)    Abs Immature Granulocytes  0.11 (*)    All other components within normal limits  RETICULOCYTES - Abnormal; Notable for the following components:   Retic Ct Pct 22.2 (*)    RBC. 2.25 (*)    Retic Count, Absolute 499.5 (*)    Immature Retic Fract 48.0 (*)    All other components within normal limits  COMPREHENSIVE METABOLIC PANEL - Abnormal; Notable for the following components:   Calcium 8.7 (*)    Total Protein 9.0 (*)    AST 54 (*)    Total Bilirubin 7.1 (*)    All other components within normal limits    EKG None  Radiology DG Chest Portable 1 View  Result Date: 04/07/2021 CLINICAL DATA:  Shortness of breath EXAM: PORTABLE CHEST 1 VIEW COMPARISON:  Chest radiograph dated March 10, 2021. FINDINGS: The heart size and mediastinal contours are within normal limits. Prominent interstitial markings concerning for multifocal area of bronchial thickening/scarring. No focal consolidation or pleural effusion. The visualized skeletal structures are unremarkable. IMPRESSION: 1.  No focal consolidation or pleural effusion. 2. Multifocal scarring/bronchial thickening in this patient with history of sickle cell disease, unchanged. Electronically Signed   By: Larose Hires D.O.   On: 04/07/2021 11:11    Procedures Procedures   Medications Ordered in ED Medications  0.45 % sodium chloride infusion ( Intravenous New Bag/Given 04/07/21 0928)  ondansetron (ZOFRAN) injection 4 mg (has no administration in time range)  HYDROmorphone (DILAUDID) injection 2 mg (2 mg Intravenous Not Given 04/07/21 1034)  diphenhydrAMINE (BENADRYL) injection 25 mg (25 mg Intravenous Given 04/07/21 0919)  HYDROmorphone (DILAUDID) injection 2 mg (2 mg Intravenous Given 04/07/21 0923)  HYDROmorphone (DILAUDID) injection 2 mg (2 mg Intravenous Given 04/07/21 1040)  HYDROmorphone (DILAUDID) injection 2 mg (2 mg Intravenous Given 04/07/21 1231)    ED Course  I have reviewed the triage vital signs and the nursing notes.  Pertinent labs &  imaging  results that were available during my care of the patient were reviewed by me and considered in my medical decision making (see chart for details).    MDM Rules/Calculators/A&P                         Patient presented hypoxic to the ED on room air, 85%.  Per patient he is usually on 3 L at home.  Patient does have a leukocytosis of 18 increased from prior.  No evidence of pneumonia. Slight hypoxia which appears to have improved with supplemental air, does not appear to be significantly different from his baseline.  Patient is still having pain despite 3 doses of Dilaudid.    Will consult hospitalist for sickle cell clinic for admission.  Dr. Doreene Burke agreeable to admission.  Appreciate his help in taking care of this patient.     Final Clinical Impression(s) / ED Diagnoses Final diagnoses:  Sickle cell anemia with pain [facility])    Rx / DC Orders ED Discharge Orders     None        Sherrill Raring, PA-C 04/07/21 1512    Pattricia Boss, MD 04/15/21 1447

## 2021-04-07 NOTE — ED Triage Notes (Signed)
Pt c/o of pain in lower back and hips starting last night.

## 2021-04-08 MED ORDER — POLYVINYL ALCOHOL 1.4 % OP SOLN
1.0000 [drp] | OPHTHALMIC | Status: DC | PRN
  Administered 2021-04-08: 06:00:00 1 [drp] via OPHTHALMIC
  Filled 2021-04-08: qty 15

## 2021-04-08 MED ORDER — SALINE SPRAY 0.65 % NA SOLN
1.0000 | NASAL | Status: DC | PRN
  Administered 2021-04-08: 11:00:00 1 via NASAL
  Filled 2021-04-08: qty 44

## 2021-04-08 NOTE — Progress Notes (Signed)
Telemetry called that pt's pulse ox was low.  Checked pt in room and pt had Ec02 off b/c he was taking his nose ring out/adjusting it.  Educated pt on pertinence of keeping it on and will have to notify dr and have pca d/c'd if he continues to take off for various reasons.   Pt stated he will put back on when he had earring adjusted.  Will continue to monitor.

## 2021-04-08 NOTE — Progress Notes (Signed)
°  Transition of Care (TOC) Screening Note   Patient Details  Name: Martin [name] Date of Birth: [birth year]   Transition of Care [facility]) CM/SW Contact:    Orla Jolliff, Meriam Sprague, RN Phone Number: 04/08/2021, 12:41 PM    Transition of Care Department [facility]) has reviewed patient and no TOC needs have been identified at this time. We will continue to monitor patient advancement through interdisciplinary progression rounds. If new patient transition needs arise, please place a TOC consult.

## 2021-04-08 NOTE — Progress Notes (Signed)
Subjective: 30 year old gentleman with sickle cell painful crisis.  Patient's pain is down to 7 out of 10 today.  Still on Dilaudid PCA with Toradol.  He is complaining of severe constipation.  Patient has received laxatives no relief.  He refuses Colace today asking for enema.  Otherwise no fever or chills no nausea vomiting or diarrhea.    Objective: Vital signs in last 24 hours: Temp:  [97.6 F (36.4 C)-98.5 F (36.9 C)] 98.5 F (36.9 C) (12/20 0535) Pulse Rate:  [73-96] 86 (12/20 0535) Resp:  [12-18] 18 (12/20 0737) BP: (92-114)/(65-79) 100/72 (12/20 0535) SpO2:  [91 %-95 %] 93 % (12/20 0737) FiO2 (%):  [0 %-40 %] 40 % (12/20 0737) Weight:  [68 kg] 68 kg (12/19 1841) Weight change:  Last BM Date: 04/06/21 (pt stated)  Intake/Output from previous day: 12/19 0701 - 12/20 0700 In: 979 [I.V.:929.9; IV Piggyback:49.1] Out: 900 [Urine:900] Intake/Output this shift: Total I/O In: 50 [IV Piggyback:50] Out: 450 [Urine:450]  General appearance: alert, cooperative, and appears stated age Neck: no adenopathy, no carotid bruit, no JVD, supple, symmetrical, trachea midline, and thyroid not enlarged, symmetric, no tenderness/mass/nodules Back: symmetric, no curvature. ROM normal. No CVA tenderness. Resp: clear to auscultation bilaterally Cardio: regular rate and rhythm, S1, S2 normal, no murmur, click, rub or gallop GI: soft, non-tender; bowel sounds normal; no masses,  no organomegaly Extremities: extremities normal, atraumatic, no cyanosis or edema  Lab Results: Recent Labs    04/07/21 0920  WBC 18.0*  HGB 9.2*  HCT 23.9*  PLT 422*   BMET Recent Labs    04/07/21 0920  NA 136  K 4.2  CL 107  CO2 23  GLUCOSE 89  BUN 10  CREATININE 0.81  CALCIUM 8.7*    Studies/Results: DG Chest Portable 1 View  Result Date: 04/07/2021 CLINICAL DATA:  Shortness of breath EXAM: PORTABLE CHEST 1 VIEW COMPARISON:  Chest radiograph dated March 10, 2021. FINDINGS: The heart size and  mediastinal contours are within normal limits. Prominent interstitial markings concerning for multifocal area of bronchial thickening/scarring. No focal consolidation or pleural effusion. The visualized skeletal structures are unremarkable. IMPRESSION: 1.  No focal consolidation or pleural effusion. 2. Multifocal scarring/bronchial thickening in this patient with history of sickle cell disease, unchanged. Electronically Signed   By: Larose Hires D.O.   On: 04/07/2021 11:11    Medications: I have reviewed the patient's current medications.  Assessment/Plan: 30 year old gentleman admitted with sickle cell painful crisis, severe constipation.  #1 sickle cell painful crisis: Continue with Dilaudid PCA, Toradol and IV fluids.  We will monitor pain and adjust accordingly.  #2 anemia of chronic disease: Monitor H&H.  Continue home regimen.  #3 chronic pain syndrome: Not on chronic home narcotics.  Continue to monitor.  #4 constipation: Add oral laxatives if no relief will try suppositories or enema.  LOS: 1 day   Jaamal Farooqui,LAWAL 04/08/2021, 8:00 AM

## 2021-04-09 ENCOUNTER — Inpatient Hospital Stay (HOSPITAL_COMMUNITY): Payer: Medicaid Other

## 2021-04-09 LAB — COMPREHENSIVE METABOLIC PANEL
ALT: 17 U/L (ref 0–44)
AST: 34 U/L (ref 15–41)
Albumin: 3.3 g/dL — ABNORMAL LOW (ref 3.5–5.0)
Alkaline Phosphatase: 56 U/L (ref 38–126)
Anion gap: 7 (ref 5–15)
BUN: 12 mg/dL (ref 6–20)
CO2: 25 mmol/L (ref 22–32)
Calcium: 9 mg/dL (ref 8.9–10.3)
Chloride: 102 mmol/L (ref 98–111)
Creatinine, Ser: 0.72 mg/dL (ref 0.61–1.24)
GFR, Estimated: >60 mL/min (ref >60)
Glucose, Bld: 101 mg/dL — ABNORMAL HIGH (ref 70–99)
Potassium: 3.8 mmol/L (ref 3.5–5.1)
Sodium: 134 mmol/L — ABNORMAL LOW (ref 135–145)
Total Bilirubin: 5.9 mg/dL — ABNORMAL HIGH (ref 0.3–1.2)
Total Protein: 8.3 g/dL — ABNORMAL HIGH (ref 6.5–8.1)

## 2021-04-09 LAB — CBC WITH DIFFERENTIAL/PLATELET
Abs Immature Granulocytes: 0.03 10*3/uL (ref 0.00–0.07)
Basophils Absolute: 0 10*3/uL (ref 0.0–0.1)
Basophils Relative: 0 %
Eosinophils Absolute: 0.3 10*3/uL (ref 0.0–0.5)
Eosinophils Relative: 3 %
HCT: 22.4 % — ABNORMAL LOW (ref 39.0–52.0)
Hemoglobin: 8.7 g/dL — ABNORMAL LOW (ref 13.0–17.0)
Immature Granulocytes: 0 %
Lymphocytes Relative: 63 %
Lymphs Abs: 6.6 10*3/uL — ABNORMAL HIGH (ref 0.7–4.0)
MCH: 37.8 pg — ABNORMAL HIGH (ref 26.0–34.0)
MCHC: 38.8 g/dL — ABNORMAL HIGH (ref 30.0–36.0)
MCV: 97.4 fL (ref 80.0–100.0)
Monocytes Absolute: 0.6 10*3/uL (ref 0.1–1.0)
Monocytes Relative: 6 %
Neutro Abs: 2.9 10*3/uL (ref 1.7–7.7)
Neutrophils Relative %: 28 %
Platelets: 334 10*3/uL (ref 150–400)
RBC: 2.3 MIL/uL — ABNORMAL LOW (ref 4.22–5.81)
RDW: 17.7 % — ABNORMAL HIGH (ref 11.5–15.5)
WBC: 10.5 10*3/uL (ref 4.0–10.5)
nRBC: 0.5 % — ABNORMAL HIGH (ref 0.0–0.2)

## 2021-04-09 MED ORDER — OXYCODONE-ACETAMINOPHEN 5-325 MG PO TABS
1.0000 | ORAL_TABLET | ORAL | Status: DC | PRN
  Administered 2021-04-13 (×2): 1 via ORAL
  Filled 2021-04-09 (×3): qty 1

## 2021-04-09 MED ORDER — LIP MEDEX EX OINT
TOPICAL_OINTMENT | CUTANEOUS | Status: DC | PRN
  Filled 2021-04-09 (×2): qty 7

## 2021-04-09 MED ORDER — OXYCODONE-ACETAMINOPHEN 10-325 MG PO TABS: 1.0000 | ORAL_TABLET | ORAL | Status: DC | PRN

## 2021-04-09 MED ORDER — OXYCODONE HCL 5 MG PO TABS
5.0000 mg | ORAL_TABLET | ORAL | Status: DC | PRN
  Administered 2021-04-13 (×2): 5 mg via ORAL
  Filled 2021-04-09 (×3): qty 1

## 2021-04-09 MED ORDER — PHENOL 1.4 % MT LIQD
1.0000 | OROMUCOSAL | Status: DC | PRN
  Filled 2021-04-09: qty 177

## 2021-04-09 MED ORDER — BISACODYL 10 MG RE SUPP
10.0000 mg | Freq: Once | RECTAL | Status: AC
  Administered 2021-04-09: 11:00:00 10 mg via RECTAL
  Filled 2021-04-09: qty 1

## 2021-04-09 NOTE — Progress Notes (Signed)
Subjective: Patient is still having problem with bowel movement.  Still several constipated.  Also requiring more oxygen today up to 5 L today.  He has a leukocytosis.  Otherwise no other complaint.  Objective: Vital signs in last 24 hours: Temp:  [98.3 F (36.8 C)-98.8 F (37.1 C)] 98.4 F (36.9 C) (12/21 0627) Pulse Rate:  [87-107] 87 (12/21 0627) Resp:  [14-18] 14 (12/21 0930) BP: (95-126)/(64-79) 95/64 (12/21 0627) SpO2:  [90 %-98 %] 94 % (12/21 0930) FiO2 (%):  [0 %-40 %] 40 % (12/21 0930) Weight change:  Last BM Date: 04/06/21  Intake/Output from previous day: 12/20 0701 - 12/21 0700 In: 3940.7 [P.O.:1200; I.V.:2540.7; IV Piggyback:200] Out: 2975 [Urine:2975] Intake/Output this shift: Total I/O In: -  Out: 400 [Urine:400]  General appearance: alert, cooperative, and appears stated age Neck: no adenopathy, no carotid bruit, no JVD, supple, symmetrical, trachea midline, and thyroid not enlarged, symmetric, no tenderness/mass/nodules Back: symmetric, no curvature. ROM normal. No CVA tenderness. Resp: clear to auscultation bilaterally Cardio: regular rate and rhythm, S1, S2 normal, no murmur, click, rub or gallop GI: soft, non-tender; bowel sounds normal; no masses,  no organomegaly Extremities: extremities normal, atraumatic, no cyanosis or edema  Lab Results: Recent Labs    04/07/21 0920  WBC 18.0*  HGB 9.2*  HCT 23.9*  PLT 422*    BMET Recent Labs    04/07/21 0920  NA 136  K 4.2  CL 107  CO2 23  GLUCOSE 89  BUN 10  CREATININE 0.81  CALCIUM 8.7*     Studies/Results: DG Chest Portable 1 View  Result Date: 04/07/2021 CLINICAL DATA:  Shortness of breath EXAM: PORTABLE CHEST 1 VIEW COMPARISON:  Chest radiograph dated March 10, 2021. FINDINGS: The heart size and mediastinal contours are within normal limits. Prominent interstitial markings concerning for multifocal area of bronchial thickening/scarring. No focal consolidation or pleural effusion. The  visualized skeletal structures are unremarkable. IMPRESSION: 1.  No focal consolidation or pleural effusion. 2. Multifocal scarring/bronchial thickening in this patient with history of sickle cell disease, unchanged. Electronically Signed   By: Larose Hires D.O.   On: 04/07/2021 11:11    Medications: I have reviewed the patient's current medications.  Assessment/Plan: 30 year old gentleman admitted with sickle cell painful crisis, severe constipation.  #1 sickle cell painful crisis: Pain is improving for the most part.  Continue with Dilaudid PCA, no Toradol and KVO IV fluids due to hypoxia and possible crackles at the bases.  We will monitor pain and adjust accordingly.  #2 anemia of chronic disease: Monitor H&H.  Repeat labs.  Continue home regimen.  #3 chronic pain syndrome: Not on chronic home narcotics.  Continue to monitor.  #4 constipation: Add oral laxatives if no relief will try suppositories or enema.  #5 related hypoxemia: Possibly due to sickle cell disease but also possibly secondary to IV fluids.  KVO IV fluids and get chest x-ray.  #6 chronic anticoagulation: Continue apixaban  LOS: 2 days   Justise Ehmann,LAWAL 04/09/2021, 9:47 AM

## 2021-04-10 MED ORDER — IBUPROFEN 100 MG/5ML PO SUSP
100.0000 mg | Freq: Once | ORAL | Status: AC
  Administered 2021-04-11: 01:00:00 100 mg via ORAL
  Filled 2021-04-10: qty 5

## 2021-04-10 NOTE — Progress Notes (Signed)
Subjective: Patient is still having problem with bowel movement.  Has not had a bowel movement despite suppository.  He is still asking for enema.  Pain is much better however.  No fever no chills.  Objective: Vital signs in last 24 hours: Temp:  [98.1 F (36.7 C)-98.6 F (37 C)] 98.3 F (36.8 C) (12/22 0538) Pulse Rate:  [87-97] 89 (12/22 0538) Resp:  [12-20] 15 (12/22 0851) BP: (96-113)/(59-66) 96/66 (12/22 0538) SpO2:  [90 %-94 %] 93 % (12/22 0851) FiO2 (%):  [25 %-40 %] 40 % (12/21 1854) Weight change:  Last BM Date: 04/09/21  Intake/Output from previous day: 12/21 0701 - 12/22 0700 In: 1120.5 [P.O.:440; I.V.:480.5; IV Piggyback:200] Out: 1975 [Urine:1975] Intake/Output this shift: No intake/output data recorded.  General appearance: alert, cooperative, and appears stated age Neck: no adenopathy, no carotid bruit, no JVD, supple, symmetrical, trachea midline, and thyroid not enlarged, symmetric, no tenderness/mass/nodules Back: symmetric, no curvature. ROM normal. No CVA tenderness. Resp: clear to auscultation bilaterally Cardio: regular rate and rhythm, S1, S2 normal, no murmur, click, rub or gallop GI: soft, non-tender; bowel sounds normal; no masses,  no organomegaly Extremities: extremities normal, atraumatic, no cyanosis or edema  Lab Results: Recent Labs    04/09/21 1005  WBC 10.5  HGB 8.7*  HCT 22.4*  PLT 334    BMET Recent Labs    04/09/21 1005  NA 134*  K 3.8  CL 102  CO2 25  GLUCOSE 101*  BUN 12  CREATININE 0.72  CALCIUM 9.0     Studies/Results: DG CHEST PORT 1 VIEW  Result Date: 04/09/2021 CLINICAL DATA:  Acute on chronic respiratory failure with hypoxemia EXAM: PORTABLE CHEST 1 VIEW COMPARISON:  04/07/2021 FINDINGS: Heart is normal size. Diffuse interstitial prominence throughout the lungs, most pronounced in the bases, favor scarring. No effusions. No acute bony abnormality. IMPRESSION: Interstitial prominence particularly in the lung  bases, favor scarring/chronic changes. Electronically Signed   By: Charlett Nose M.D.   On: 04/09/2021 12:22    Medications: I have reviewed the patient's current medications.  Assessment/Plan: 30 year old gentleman admitted with sickle cell painful crisis, severe constipation.  #1 sickle cell painful crisis: Pain is improving for the most part.  Down to 7 out of 10 today.  Continue with Dilaudid PCA, no Toradol and KVO IV fluids due to hypoxia and possible crackles at the bases.  We will monitor pain and adjust accordingly.  #2 anemia of chronic disease: Monitor H&H.  Repeat labs in the morning.  Continue home regimen.  #3 chronic pain syndrome: Not on chronic home narcotics.  Continue to monitor.  #4 constipation: We will order as soap sud enema #5 related hypoxemia: Possibly due to sickle cell disease but also possibly secondary to IV fluids.  KVO IV fluids and get chest x-ray.  #6 chronic anticoagulation: Continue apixaban  LOS: 3 days   Auburn Hester,LAWAL 04/10/2021, 10:06 AM

## 2021-04-11 LAB — COMPREHENSIVE METABOLIC PANEL
ALT: 15 U/L (ref 0–44)
AST: 42 U/L — ABNORMAL HIGH (ref 15–41)
Albumin: 3.2 g/dL — ABNORMAL LOW (ref 3.5–5.0)
Alkaline Phosphatase: 54 U/L (ref 38–126)
Anion gap: 5 (ref 5–15)
BUN: 15 mg/dL (ref 6–20)
CO2: 25 mmol/L (ref 22–32)
Calcium: 8.8 mg/dL — ABNORMAL LOW (ref 8.9–10.3)
Chloride: 105 mmol/L (ref 98–111)
Creatinine, Ser: 0.68 mg/dL (ref 0.61–1.24)
GFR, Estimated: >60 mL/min (ref >60)
Glucose, Bld: 88 mg/dL (ref 70–99)
Potassium: 4.2 mmol/L (ref 3.5–5.1)
Sodium: 135 mmol/L (ref 135–145)
Total Bilirubin: 5.6 mg/dL — ABNORMAL HIGH (ref 0.3–1.2)
Total Protein: 8.1 g/dL (ref 6.5–8.1)

## 2021-04-11 LAB — CBC WITH DIFFERENTIAL/PLATELET
Abs Immature Granulocytes: 0.02 10*3/uL (ref 0.00–0.07)
Basophils Absolute: 0 10*3/uL (ref 0.0–0.1)
Basophils Relative: 0 %
Eosinophils Absolute: 0.3 10*3/uL (ref 0.0–0.5)
Eosinophils Relative: 3 %
HCT: 16.9 % — ABNORMAL LOW (ref 39.0–52.0)
Hemoglobin: 6.7 g/dL — CL (ref 13.0–17.0)
Immature Granulocytes: 0 %
Lymphocytes Relative: 62 %
Lymphs Abs: 6.8 10*3/uL — ABNORMAL HIGH (ref 0.7–4.0)
MCH: 38.1 pg — ABNORMAL HIGH (ref 26.0–34.0)
MCHC: 39.6 g/dL — ABNORMAL HIGH (ref 30.0–36.0)
MCV: 96 fL (ref 80.0–100.0)
Monocytes Absolute: 0.7 10*3/uL (ref 0.1–1.0)
Monocytes Relative: 6 %
Neutro Abs: 3.3 10*3/uL (ref 1.7–7.7)
Neutrophils Relative %: 29 %
Platelets: 304 10*3/uL (ref 150–400)
RBC: 1.76 MIL/uL — ABNORMAL LOW (ref 4.22–5.81)
RDW: 17.3 % — ABNORMAL HIGH (ref 11.5–15.5)
WBC: 11.1 10*3/uL — ABNORMAL HIGH (ref 4.0–10.5)
nRBC: 2.9 % — ABNORMAL HIGH (ref 0.0–0.2)

## 2021-04-11 NOTE — Progress Notes (Addendum)
Subjective: Patient has had some bowel movement with enema yesterday.  Is having significant diaphoresis.  Hemoglobin dropped from 8.7-6.7 overnight.  Also leukocytosis with white count going up to 11.1.  His pain is reportedly going up to 8 out of 10 today..  Objective: Vital signs in last 24 hours: Temp:  [98 F (36.7 C)-98.6 F (37 C)] 98 F (36.7 C) (12/23 1201) Pulse Rate:  [88-103] 97 (12/23 1201) Resp:  [11-20] 17 (12/23 1203) BP: (96-104)/(61-69) 96/64 (12/23 1201) SpO2:  [86 %-93 %] 88 % (12/23 1203) Weight change:  Last BM Date: 04/10/21  Intake/Output from previous day: 12/22 0701 - 12/23 0700 In: 720 [P.O.:720] Out: 1950 [Urine:1950] Intake/Output this shift: Total I/O In: 240 [P.O.:240] Out: 600 [Urine:600]  General appearance: alert, cooperative, and appears stated age Neck: no adenopathy, no carotid bruit, no JVD, supple, symmetrical, trachea midline, and thyroid not enlarged, symmetric, no tenderness/mass/nodules Back: symmetric, no curvature. ROM normal. No CVA tenderness. Resp: clear to auscultation bilaterally Cardio: regular rate and rhythm, S1, S2 normal, no murmur, click, rub or gallop GI: soft, non-tender; bowel sounds normal; no masses,  no organomegaly Extremities: extremities normal, atraumatic, no cyanosis or edema  Lab Results: Recent Labs    04/09/21 1005 04/11/21 0912  WBC 10.5 11.1*  HGB 8.7* 6.7*  HCT 22.4* 16.9*  PLT 334 304    BMET Recent Labs    04/09/21 1005 04/11/21 0712  NA 134* 135  K 3.8 4.2  CL 102 105  CO2 25 25  GLUCOSE 101* 88  BUN 12 15  CREATININE 0.72 0.68  CALCIUM 9.0 8.8*     Studies/Results: No results found.  Medications: I have reviewed the patient's current medications.  Assessment/Plan: 30 year old gentleman admitted with sickle cell painful crisis, severe constipation.  #1 sickle cell painful crisis: Pain is slightly worsening pain today.  Also dropped in hemoglobin which could be positional or  early hemolysis.  Continue with Dilaudid PCA, no Toradol and KVO IV fluids due to hypoxia and possible crackles at the bases.  We will monitor pain and adjust accordingly.  #2 anemia of chronic disease: Hemoglobin has dropped 2 g.  Probably hemodilution.  Monitor H&H.  Repeat labs in the morning.  Continue home regimen.  #3 chronic pain syndrome: Not on chronic home narcotics.  Continue to monitor.  #4 constipation: Responded to enema slight.  Continue scheduled laxatives  #5  Acute on chronic respiratory failure with  hypoxemia: Possibly due to sickle cell disease but also possibly secondary to IV fluids.  KVO IV fluids and get chest x-ray.  #6 chronic anticoagulation: Continue apixaban  LOS: 4 days   Martin [name],LAWAL 04/11/2021, 4:11 PM

## 2021-04-12 LAB — CBC
HCT: 16.3 % — ABNORMAL LOW (ref 39.0–52.0)
Hemoglobin: 6.5 g/dL — CL (ref 13.0–17.0)
MCH: 37.6 pg — ABNORMAL HIGH (ref 26.0–34.0)
MCHC: 39.9 g/dL — ABNORMAL HIGH (ref 30.0–36.0)
MCV: 94.2 fL (ref 80.0–100.0)
Platelets: 390 10*3/uL (ref 150–400)
RBC: 1.73 MIL/uL — ABNORMAL LOW (ref 4.22–5.81)
RDW: 17.9 % — ABNORMAL HIGH (ref 11.5–15.5)
WBC: 16 10*3/uL — ABNORMAL HIGH (ref 4.0–10.5)
nRBC: 2.8 % — ABNORMAL HIGH (ref 0.0–0.2)

## 2021-04-12 LAB — BASIC METABOLIC PANEL
Anion gap: 6 (ref 5–15)
BUN: 14 mg/dL (ref 6–20)
CO2: 25 mmol/L (ref 22–32)
Calcium: 8.9 mg/dL (ref 8.9–10.3)
Chloride: 103 mmol/L (ref 98–111)
Creatinine, Ser: 0.84 mg/dL (ref 0.61–1.24)
GFR, Estimated: >60 mL/min (ref >60)
Glucose, Bld: 89 mg/dL (ref 70–99)
Potassium: 4.5 mmol/L (ref 3.5–5.1)
Sodium: 134 mmol/L — ABNORMAL LOW (ref 135–145)

## 2021-04-12 LAB — PREPARE RBC (CROSSMATCH)

## 2021-04-12 MED ORDER — SODIUM CHLORIDE 0.9% IV SOLUTION: Freq: Once | INTRAVENOUS | Status: AC

## 2021-04-12 MED ORDER — ACETAMINOPHEN 325 MG PO TABS
650.0000 mg | ORAL_TABLET | Freq: Once | ORAL | Status: AC
  Administered 2021-04-12: 22:00:00 650 mg via ORAL
  Filled 2021-04-12: qty 2

## 2021-04-12 MED ORDER — DIPHENHYDRAMINE HCL 50 MG/ML IJ SOLN
25.0000 mg | Freq: Once | INTRAMUSCULAR | Status: AC
  Administered 2021-04-12: 22:00:00 25 mg via INTRAVENOUS
  Filled 2021-04-12: qty 1

## 2021-04-12 NOTE — Progress Notes (Signed)
Subjective: Martin [name] is a 30 year old male with a medical history significant for sickle cell disease, chronic pain syndrome, opiate dependence and tolerance, history of HIV, generalized anxiety, and history of PE on Eliquis was admitted for sickle cell pain crisis.  Patient continues to complain of pain primarily to low back and lower extremities.  He rates his pain as 7/10.  Today, patient's hemoglobin is 6.5 g/dL, which is below his baseline.  He denies any dizziness, headache, chest pain, urinary symptoms, nausea, vomiting, or diarrhea. Objective:  Vital signs in last 24 hours:  Vitals:   04/12/21 0518 04/12/21 0758 04/12/21 0830 04/12/21 1057  BP: 102/62   (!) 96/56  Pulse: (!) 103   84  Resp: 18 13 14 14   Temp: 98.9 F (37.2 C)   98.4 F (36.9 C)  TempSrc: Oral   Oral  SpO2: (!) 87% 90% 90% 93%  Weight:      Height:        Intake/Output from previous day:   Intake/Output Summary (Last 24 hours) at 04/12/2021 1320 Last data filed at 04/12/2021 0800 Gross per 24 hour  Intake 240 ml  Output 1050 ml  Net -810 ml    Physical Exam: General: Alert, awake, oriented x3, in no acute distress.  HEENT: [place]/AT PEERL, EOMI Neck: Trachea midline,  no masses, no thyromegal,y no JVD, no carotid bruit OROPHARYNX:  Moist, No exudate/ erythema/lesions.  Heart: Regular rate and rhythm, without murmurs, rubs, gallops, PMI non-displaced, no heaves or thrills on palpation.  Lungs: Clear to auscultation, no wheezing or rhonchi noted. No increased vocal fremitus resonant to percussion  Abdomen: Soft, nontender, nondistended, positive bowel sounds, no masses no hepatosplenomegaly noted..  Neuro: No focal neurological deficits noted cranial nerves II through XII grossly intact. DTRs 2+ bilaterally upper and lower extremities. Strength 5 out of 5 in bilateral upper and lower extremities. Musculoskeletal: No warm swelling or erythema around joints, no spinal tenderness noted. Psychiatric:  Patient alert and oriented x3, good insight and cognition, good recent to remote recall. Lymph node survey: No cervical axillary or inguinal lymphadenopathy noted.  Lab Results:  Basic Metabolic Panel:    Component Value Date/Time   NA 135 04/11/2021 0712   NA 136 03/18/2021 1232   K 4.2 04/11/2021 0712   CL 105 04/11/2021 0712   CO2 25 04/11/2021 0712   BUN 15 04/11/2021 0712   BUN 11 03/18/2021 1232   CREATININE 0.68 04/11/2021 0712   GLUCOSE 88 04/11/2021 0712   CALCIUM 8.8 (L) 04/11/2021 0712   CBC:    Component Value Date/Time   WBC 11.1 (H) 04/11/2021 0912   HGB 6.7 (LL) 04/11/2021 0912   HGB 8.3 (L) 03/18/2021 1232   HCT 16.9 (L) 04/11/2021 0912   HCT 22.8 (L) 03/18/2021 1232   PLT 304 04/11/2021 0912   PLT 361 03/18/2021 1232   MCV 96.0 04/11/2021 0912   MCV 111 (H) 03/18/2021 1232   NEUTROABS 3.3 04/11/2021 0912   NEUTROABS 6.3 03/18/2021 1232   LYMPHSABS 6.8 (H) 04/11/2021 0912   LYMPHSABS 6.1 (H) 03/18/2021 1232   MONOABS 0.7 04/11/2021 0912   EOSABS 0.3 04/11/2021 0912   EOSABS 0.2 03/18/2021 1232   BASOSABS 0.0 04/11/2021 0912   BASOSABS 0.1 03/18/2021 1232    Recent Results (from the past 240 hour(s))  Resp Panel by RT-PCR (Flu A&B, Covid) Nasopharyngeal Swab     Status: None   Collection Time: 04/07/21  4:00 PM   Specimen: Nasopharyngeal Swab; Nasopharyngeal(NP) swabs  in vial transport medium  Result Value Ref Range Status   SARS Coronavirus 2 by RT PCR NEGATIVE NEGATIVE Final    Comment: (NOTE) SARS-CoV-2 target nucleic acids are NOT DETECTED.  The SARS-CoV-2 RNA is generally detectable in upper respiratory specimens during the acute phase of infection. The lowest concentration of SARS-CoV-2 viral copies this assay can detect is 138 copies/mL. A negative result does not preclude SARS-Cov-2 infection and should not be used as the sole basis for treatment or other patient management decisions. A negative result may occur with  improper specimen  collection/handling, submission of specimen other than nasopharyngeal swab, presence of viral mutation(s) within the areas targeted by this assay, and inadequate number of viral copies(<138 copies/mL). A negative result must be combined with clinical observations, patient history, and epidemiological information. The expected result is Negative.  Fact Sheet for Patients:  BloggerCourse.com  Fact Sheet for Healthcare Providers:  SeriousBroker.it  This test is no t yet approved or cleared by the [place] FDA and  has been authorized for detection and/or diagnosis of SARS-CoV-2 by FDA under an Emergency Use Authorization (EUA). This EUA will remain  in effect (meaning this test can be used) for the duration of the COVID-19 declaration under Section 564(b)(1) of the Act, 21 U.S.C.section 360bbb-3(b)(1), unless the authorization is terminated  or revoked sooner.       Influenza A by PCR NEGATIVE NEGATIVE Final   Influenza B by PCR NEGATIVE NEGATIVE Final    Comment: (NOTE) The Xpert Xpress SARS-CoV-2/FLU/RSV plus assay is intended as an aid in the diagnosis of influenza from Nasopharyngeal swab specimens and should not be used as a sole basis for treatment. Nasal washings and aspirates are unacceptable for Xpert Xpress SARS-CoV-2/FLU/RSV testing.  Fact Sheet for Patients: BloggerCourse.com  Fact Sheet for Healthcare Providers: SeriousBroker.it  This test is not yet approved or cleared by the [place] FDA and has been authorized for detection and/or diagnosis of SARS-CoV-2 by FDA under an Emergency Use Authorization (EUA). This EUA will remain in effect (meaning this test can be used) for the duration of the COVID-19 declaration under Section 564(b)(1) of the Act, 21 U.S.C. section 360bbb-3(b)(1), unless the authorization is terminated or revoked.  Performed at [facility], [address], [place], [place] [address]     Studies/Results: No results found.  Medications: Scheduled Meds:  abacavir-dolutegravir-lamiVUDine  1 tablet Oral QHS   apixaban  5 mg Oral BID   cyclobenzaprine  10 mg Oral TID   HYDROmorphone   Intravenous Q4H   hydroxyurea  2,000 mg Oral QHS   senna-docusate  1 tablet Oral BID   Continuous Infusions:  sodium chloride 10 mL/hr at 04/10/21 0304   diphenhydrAMINE 25 mg (04/12/21 0538)   PRN Meds:.ALPRAZolam, diphenhydrAMINE **OR** diphenhydrAMINE, lip balm, naloxone **AND** sodium chloride flush, ondansetron (ZOFRAN) IV, oxyCODONE-acetaminophen **AND** oxyCODONE, phenol, polyethylene glycol, polyvinyl alcohol, sodium chloride  Consultants: none  Procedures: none  Antibiotics: none  Assessment/Plan: Principal Problem:   Sickle cell anemia with crisis (HCC) Active Problems:   Generalized anxiety disorder   Human immunodeficiency virus (HIV) disease (HCC)   History of pulmonary embolus (PE)   Leukocytosis  Sickle cell disease with pain crisis: Pain persists despite IV Dilaudid PCA.  Continue, reduced dose.  Continue to hold Toradol due to listed allergy. Percocet 10-325 mg every 4 hours as needed.  Monitor vital signs very closely, reevaluate pain scale regularly, and supplemental oxygen as needed  Anemia of chronic disease: Hemoglobin  decreased to 6.5 g/dL.  Transfuse 1 unit PRBCs.  Follow labs in AM.  Chronic pain syndrome: Continue home medications  Acute on chronic respiratory failure with hypoxia: More than likely secondary to sickle cell disease.  Patient on home oxygen.  Continue supplemental oxygen at 2 L.  History of PE: Continue apixaban  Code Status: Full Code Family Communication: N/A Disposition Plan: Not yet ready for discharge  Mandell Pangborn Rennis Petty  APRN, [name], [name] [facility] [address] [place], [place] [address] [phone]   If 7PM-7AM, please contact night-coverage.  04/12/2021, 1:20 PM  LOS: 5 days

## 2021-04-13 LAB — CBC
HCT: 19.8 % — ABNORMAL LOW (ref 39.0–52.0)
Hemoglobin: 7.7 g/dL — ABNORMAL LOW (ref 13.0–17.0)
MCH: 37 pg — ABNORMAL HIGH (ref 26.0–34.0)
MCHC: 38.9 g/dL — ABNORMAL HIGH (ref 30.0–36.0)
MCV: 95.2 fL (ref 80.0–100.0)
Platelets: 406 10*3/uL — ABNORMAL HIGH (ref 150–400)
RBC: 2.08 MIL/uL — ABNORMAL LOW (ref 4.22–5.81)
RDW: 19 % — ABNORMAL HIGH (ref 11.5–15.5)
WBC: 14.3 10*3/uL — ABNORMAL HIGH (ref 4.0–10.5)
nRBC: 4.3 % — ABNORMAL HIGH (ref 0.0–0.2)

## 2021-04-13 LAB — BPAM RBC
Blood Product Expiration Date: 202301172359
ISSUE DATE / TIME: 202212242152
Unit Type and Rh: 5100

## 2021-04-13 LAB — TYPE AND SCREEN
ABO/RH(D): O POS
Antibody Screen: NEGATIVE
Donor AG Type: NEGATIVE
Unit division: 0

## 2021-04-13 NOTE — Plan of Care (Signed)

## 2021-04-13 NOTE — Progress Notes (Signed)
Pt discharged home in stable condition. Discharge instructions given. Pt  verbalized understanding. No immediate questions or concerns at this time. Pt opted to ambulate off department.

## 2021-04-13 NOTE — Discharge Summary (Signed)
Physician Discharge Summary  Martin [name]:[id number] DOB: [birth year] DOA: 04/07/2021  PCP: [name], [name]  Admit date: 04/07/2021  Discharge date: 04/15/2021  Discharge Diagnoses:  Principal Problem:   Sickle cell anemia with crisis [facility]) Active Problems:   Generalized anxiety disorder   Human immunodeficiency virus (HIV) disease ([place])   History of pulmonary embolus (PE)   Leukocytosis   Discharge Condition: Stable  Disposition:   Follow-up Information     [name], [name] Follow up.   Specialty: Family Medicine Contact information: [address] [place] [address] [phone]                Pt is discharged home in good condition and is to follow up with [name], [name] this week to have labs evaluated. Martin [name] is instructed to increase activity slowly and balance with rest for the next few days, and use prescribed medication to complete treatment of pain  Diet: Regular Wt Readings from Last 3 Encounters:  04/07/21 68 kg  03/18/21 63.3 kg  03/11/21 69.2 kg    History of present illness:  Martin [name] is a 30 year old male with a medical history significant for sickle cell disease, chronic pain syndrome, opiate dependence and tolerance, stable HIV disease on antiviral medications, history of pulmonary embolism on Eliquis, and anemia of chronic disease who came to the emergency room today with major complaints of generalized body pain that is consistent with his typical sickle cell pain crisis.  Patient claims to have been in his usual state of health until he started feeling sickle cell related pain all over, he took his home medications with no sustained relief.  He said his pain is more significant in his lower back and lower extremities.  He rates his pain as 10/10 at the time of this encounter.  While in the emergency room, patient was hypoxic with oxygen saturation around 85%.  Patient is on home  oxygen, but does not use it consistently.  He denies any shortness of breath, dizziness, fever, chills, nausea, vomiting, or diarrhea.  No urinary symptoms.  He denies any sick contacts, recent travels, or known exposure to COVID-19.  ER course: His vital signs show: BP 114/75, pulse 90, temperature 98.0 F, respirations 14, and oxygen saturation 93% on 2 L.  Of note, patient desaturated to 85% on room air but is much improved with supplemental oxygen.  His labs show leukocytosis of 18, which is an increase from previous results.  Comprehensive metabolic panel was unremarkable, increased reticulocyte count to 22.2%.  Respiratory panel was negative for COVID-19 and influenza.  Chest x-ray unremarkable.  After multiple doses of IV Dilaudid and other adjunct therapy in the emergency department, patient remains largely unchanged.  Pain remains uncontrolled.  Patient will be admitted to the hospital for further evaluation and management of sickle cell pain crisis.  Hospital Course:  Sickle cell disease with pain crisis: Patient was admitted to [place] for management of his sickle cell pain crisis.  IV Dilaudid PCA was initiated and wane throughout admission.  Also, patient was restarted on his home opiate medication regimen.  Today, patient's pain intensity is 5/10 and he is requesting discharge home.  He feels that he can manage at home on current medication regimen.  Patient has all medications at home and does not need any prescription refills at this time.  Anemia of chronic disease: Patient's hemoglobin reached a nadir of 6.5 g/dL, which is below his  baseline of 8-9 g/dL.  He was transfused 1 unit PRBCs and hemoglobin returned to baseline prior to discharge.  Patient will follow-up with PCP on 04/17/2021 to repeat CBC with differential.  Patient expressed understanding of this plan.  History of PE: Patient asked to resume apixaban.  History of HIV: Stable.  Patient is followed by infectious disease  in [place].    Patient is alert, oriented, and ambulating without assistance.  Patient will continue supplemental oxygen at home.  He is aware of all upcoming appointments.   Patient was therefore discharged home today in a hemodynamically stable condition.   Levan will follow-up with PCP within 1 week of this discharge. Savalas was counseled extensively about nonpharmacologic means of pain management, patient verbalized understanding and was appreciative of  the care received during this admission.   We discussed the need for good hydration, monitoring of hydration status, avoidance of heat, cold, stress, and infection triggers. We discussed the need to be adherent with taking Hydrea and other home medications. Patient was reminded of the need to seek medical attention immediately if any symptom of bleeding, anemia, or infection occurs.  Discharge Exam: Vitals:   04/13/21 1103 04/13/21 1236  BP:    Pulse:    Resp: 16 17  Temp:    SpO2: 98% 97%   Vitals:   04/13/21 0743 04/13/21 0931 04/13/21 1103 04/13/21 1236  BP:  104/69    Pulse:  80    Resp: '15 14 16 17  ' Temp:  98.3 F (36.8 C)    TempSrc:  Oral    SpO2: 97% 98% 98% 97%  Weight:      Height:        General appearance : Awake, alert, not in any distress. Speech Clear. Not toxic looking HEENT: Atraumatic and Normocephalic, pupils equally reactive to light and accomodation Neck: Supple, no JVD. No cervical lymphadenopathy.  Chest: Good air entry bilaterally, no added sounds  CVS: S1 S2 regular, no murmurs.  Abdomen: Bowel sounds present, Non tender and not distended with no gaurding, rigidity or rebound. Extremities: B/L Lower Ext shows no edema, both legs are warm to touch Neurology: Awake alert, and oriented X 3, CN II-XII intact, Non focal Skin: No Rash  Discharge Instructions  Discharge Instructions     Discharge patient   Complete by: As directed    Discharge disposition: 01-Home or Self Care   Discharge  patient date: 04/13/2021      Allergies as of 04/13/2021       Reactions   Ketorolac Tromethamine Swelling, Other (See Comments)   Patient reports facial edema and left arm edema after administration.   Tape Rash, Other (See Comments)   PLEASE DO NOT USE THE CLEAR, THICK, "PLASTIC" TAPE- only paper tape is tolerated    Wound Dressing Adhesive Rash        Medication List     TAKE these medications    acetaminophen 325 MG tablet Commonly known as: TYLENOL Take 650 mg by mouth every 6 (six) hours as needed for pain or headache.   ALPRAZolam 1 MG tablet Commonly known as: XANAX Take 1 mg by mouth 3 (three) times daily as needed for anxiety.   apixaban 5 MG Tabs tablet Commonly known as: ELIQUIS Take 5 mg by mouth 2 (two) times daily.   cetirizine 10 MG tablet Commonly known as: ZyrTEC Allergy Take 1 tablet (10 mg total) by mouth daily. What changed:  when to take this reasons to take this  cyclobenzaprine 10 MG tablet Commonly known as: FLEXERIL Take 10 mg by mouth 3 (three) times daily.   hydroxyurea 500 MG capsule Commonly known as: HYDREA Take 4 capsules (2,000 mg total) by mouth at bedtime.   melatonin 3 MG Tabs tablet Take 6 mg by mouth daily as needed for sleep.   multivitamin with minerals Tabs tablet Take 1 tablet by mouth daily with breakfast.   naloxone 4 MG/0.1ML Liqd nasal spray kit Commonly known as: NARCAN Place 4 mg into the nose as needed for opioid reversal.   oxyCODONE-acetaminophen 10-325 MG tablet Commonly known as: Percocet Take 1 tablet by mouth every 4 (four) hours as needed for pain.   promethazine 12.5 MG tablet Commonly known as: PHENERGAN Take 12.5 mg by mouth every 8 (eight) hours as needed for nausea/vomiting.   Triumeq 600-50-300 MG tablet Generic drug: abacavir-dolutegravir-lamiVUDine Take 1 tablet by mouth at bedtime.   Vitamin D (Ergocalciferol) 1.25 MG (50000 UNIT) Caps capsule Commonly known as: DRISDOL Take 1  capsule (50,000 Units total) by mouth every 7 (seven) days. What changed: when to take this        The results of significant diagnostics from this hospitalization (including imaging, microbiology, ancillary and laboratory) are listed below for reference.    Significant Diagnostic Studies: DG CHEST PORT 1 VIEW  Result Date: 04/09/2021 CLINICAL DATA:  Acute on chronic respiratory failure with hypoxemia EXAM: PORTABLE CHEST 1 VIEW COMPARISON:  04/07/2021 FINDINGS: Heart is normal size. Diffuse interstitial prominence throughout the lungs, most pronounced in the bases, favor scarring. No effusions. No acute bony abnormality. IMPRESSION: Interstitial prominence particularly in the lung bases, favor scarring/chronic changes. Electronically Signed   By: Rolm Baptise M.D.   On: 04/09/2021 12:22   DG Chest Portable 1 View  Result Date: 04/07/2021 CLINICAL DATA:  Shortness of breath EXAM: PORTABLE CHEST 1 VIEW COMPARISON:  Chest radiograph dated March 10, 2021. FINDINGS: The heart size and mediastinal contours are within normal limits. Prominent interstitial markings concerning for multifocal area of bronchial thickening/scarring. No focal consolidation or pleural effusion. The visualized skeletal structures are unremarkable. IMPRESSION: 1.  No focal consolidation or pleural effusion. 2. Multifocal scarring/bronchial thickening in this patient with history of sickle cell disease, unchanged. Electronically Signed   By: Keane Police D.O.   On: 04/07/2021 11:11    Microbiology: Recent Results (from the past 240 hour(s))  Resp Panel by RT-PCR (Flu A&B, Covid) Nasopharyngeal Swab     Status: None   Collection Time: 04/07/21  4:00 PM   Specimen: Nasopharyngeal Swab; Nasopharyngeal(NP) swabs in vial transport medium  Result Value Ref Range Status   SARS Coronavirus 2 by RT PCR NEGATIVE NEGATIVE Final    Comment: (NOTE) SARS-CoV-2 target nucleic acids are NOT DETECTED.  The SARS-CoV-2 RNA is  generally detectable in upper respiratory specimens during the acute phase of infection. The lowest concentration of SARS-CoV-2 viral copies this assay can detect is 138 copies/mL. A negative result does not preclude SARS-Cov-2 infection and should not be used as the sole basis for treatment or other patient management decisions. A negative result may occur with  improper specimen collection/handling, submission of specimen other than nasopharyngeal swab, presence of viral mutation(s) within the areas targeted by this assay, and inadequate number of viral copies(<138 copies/mL). A negative result must be combined with clinical observations, patient history, and epidemiological information. The expected result is Negative.  Fact Sheet for Patients:  EntrepreneurPulse.com.au  Fact Sheet for Healthcare Providers:  IncredibleEmployment.be  This test  is no t yet approved or cleared by the [place] and  has been authorized for detection and/or diagnosis of SARS-CoV-2 by FDA under an Emergency Use Authorization (EUA). This EUA will remain  in effect (meaning this test can be used) for the duration of the COVID-19 declaration under Section 564(b)(1) of the Act, 21 U.S.C.section 360bbb-3(b)(1), unless the authorization is terminated  or revoked sooner.       Influenza A by PCR NEGATIVE NEGATIVE Final   Influenza B by PCR NEGATIVE NEGATIVE Final    Comment: (NOTE) The Xpert Xpress SARS-CoV-2/FLU/RSV plus assay is intended as an aid in the diagnosis of influenza from Nasopharyngeal swab specimens and should not be used as a sole basis for treatment. Nasal washings and aspirates are unacceptable for Xpert Xpress SARS-CoV-2/FLU/RSV testing.  Fact Sheet for Patients: EntrepreneurPulse.com.au  Fact Sheet for Healthcare Providers: IncredibleEmployment.be  This test is not yet approved or cleared by the [place] FDA and has been authorized for detection and/or diagnosis of SARS-CoV-2 by FDA under an Emergency Use Authorization (EUA). This EUA will remain in effect (meaning this test can be used) for the duration of the COVID-19 declaration under Section 564(b)(1) of the Act, 21 U.S.C. section 360bbb-3(b)(1), unless the authorization is terminated or revoked.  Performed at [facility], [place] [address] [place], [place] [address]      Labs: Basic Metabolic Panel: Recent Labs  Lab 04/09/21 1005 04/11/21 0712 04/12/21 1359  NA 134* 135 134*  K 3.8 4.2 4.5  CL 102 105 103  CO2 '25 25 25  ' GLUCOSE 101* 88 89  BUN '12 15 14  ' CREATININE 0.72 0.68 0.84  CALCIUM 9.0 8.8* 8.9   Liver Function Tests: Recent Labs  Lab 04/09/21 1005 04/11/21 0712  AST 34 42*  ALT 17 15  ALKPHOS 56 54  BILITOT 5.9* 5.6*  PROT 8.3* 8.1  ALBUMIN 3.3* 3.2*   No results for input(s): LIPASE, AMYLASE in the last 168 hours. No results for input(s): AMMONIA in the last 168 hours. CBC: Recent Labs  Lab 04/09/21 1005 04/11/21 0912 04/12/21 1545 04/13/21 1006  WBC 10.5 11.1* 16.0* 14.3*  NEUTROABS 2.9 3.3  --   --   HGB 8.7* 6.7* 6.5* 7.7*  HCT 22.4* 16.9* 16.3* 19.8*  MCV 97.4 96.0 94.2 95.2  PLT 334 304 390 406*   Cardiac Enzymes: No results for input(s): CKTOTAL, CKMB, CKMBINDEX, TROPONINI in the last 168 hours. BNP: Invalid input(s): POCBNP CBG: No results for input(s): GLUCAP in the last 168 hours.  Time coordinating discharge: 30 minutes  Signed:  Donia Pounds  APRN, [name], [name] Patient [facility] [address] [place], [place] [address] [phone]  [facility] 04/15/2021, 4:29 PM

## 2021-04-17 ENCOUNTER — Telehealth: Payer: Self-pay

## 2021-04-17 NOTE — Telephone Encounter (Signed)
Percocet  Cetirizine

## 2021-04-18 ENCOUNTER — Other Ambulatory Visit: Payer: Self-pay | Admitting: Family Medicine

## 2021-04-18 DIAGNOSIS — D571 Sickle-cell disease without crisis: Secondary | ICD-10-CM

## 2021-04-18 DIAGNOSIS — G8929 Other chronic pain: Secondary | ICD-10-CM

## 2021-04-18 MED ORDER — CETIRIZINE HCL 10 MG PO TABS: 10.0000 mg | ORAL_TABLET | Freq: Every day | ORAL | 2 refills | Status: DC | PRN

## 2021-04-18 MED ORDER — OXYCODONE-ACETAMINOPHEN 10-325 MG PO TABS: 1.0000 | ORAL_TABLET | ORAL | 0 refills | Status: DC | PRN

## 2021-04-18 NOTE — Progress Notes (Signed)
Reviewed PDMP substance reporting system prior to prescribing opiate medications. No inconsistencies noted.  Meds ordered this encounter  Medications   oxyCODONE-acetaminophen (PERCOCET) 10-325 MG tablet    Sig: Take 1 tablet by mouth every 4 (four) hours as needed for pain.    Dispense:  90 tablet    Refill:  0    Order Specific Question:   Supervising Provider    Answer:   Quentin Angst [[id number]]   cetirizine (ZYRTEC ALLERGY) 10 MG tablet    Sig: Take 1 tablet (10 mg total) by mouth daily as needed for allergies.    Dispense:  90 tablet    Refill:  2    Order Specific Question:   Supervising Provider    Answer:   Quentin Angst [[id number]]   [name]  APRN, [name], [name] Patient Care [facility] [address] [place], [place] [address] [phone]

## 2021-04-30 ENCOUNTER — Telehealth: Payer: Self-pay

## 2021-04-30 NOTE — Telephone Encounter (Signed)
Oxycodone  °

## 2021-05-01 ENCOUNTER — Other Ambulatory Visit: Payer: Self-pay | Admitting: Family Medicine

## 2021-05-01 DIAGNOSIS — D571 Sickle-cell disease without crisis: Secondary | ICD-10-CM

## 2021-05-01 DIAGNOSIS — G8929 Other chronic pain: Secondary | ICD-10-CM

## 2021-05-01 MED ORDER — OXYCODONE-ACETAMINOPHEN 10-325 MG PO TABS: 1.0000 | ORAL_TABLET | ORAL | 0 refills | Status: DC | PRN

## 2021-05-01 NOTE — Progress Notes (Signed)
Reviewed PDMP substance reporting system prior to prescribing opiate medications. No inconsistencies noted.  Meds ordered this encounter  Medications   oxyCODONE-acetaminophen (PERCOCET) 10-325 MG tablet    Sig: Take 1 tablet by mouth every 4 (four) hours as needed for pain.    Dispense:  90 tablet    Refill:  0    Order Specific Question:   Supervising Provider    Answer:   JEGEDE, OLUGBEMIGA E [[id number]]   Martin Lindfors Moore Zenaida Tesar  APRN, [name], [name] Patient Care [facility] [address]  [place], [place] [address] [phone]  

## 2021-05-09 ENCOUNTER — Telehealth: Payer: Self-pay

## 2021-05-09 NOTE — Telephone Encounter (Signed)
Transition Care Management Follow-up Telephone Call Date of discharge and from where: 04/13/2021  [facility] How have you been since you were released from the hospital? Doing good Any questions or concerns? No  Items Reviewed: Did the pt receive and understand the discharge instructions provided? Yes  Medications obtained and verified? Yes  Other? No  Any new allergies since your discharge? No  Dietary orders reviewed? No Do you have support at home?  unknown  Home Care and Equipment/Supplies: Were home health services ordered? no If so, what is the name of the agency?  Has the agency set up a time to come to the patient's home? not applicable Were any new equipment or medical supplies ordered?  No What is the name of the medical supply agency?  Were you able to get the supplies/equipment? not applicable Do you have any questions related to the use of the equipment or supplies? No  Functional Questionnaire: (I = Independent and D = Dependent) ADLs: I  Bathing/Dressing- I  Meal Prep- I  Eating- I  Maintaining continence- I  Transferring/Ambulation- I  Managing Meds- I  Follow up appointments reviewed:  PCP Hospital f/u appt confirmed? Yes  Scheduled to see Dr. Hart [place] on 05/13/2021 Specialist Hospital f/u appt confirmed? No   Are transportation arrangements needed? No  If their condition worsens, is the pt aware to call PCP or go to the Emergency Dept.? Yes Was the patient provided with contact information for the PCP's office or ED? Yes Was to pt encouraged to call back with questions or concerns? Yes  Rowe Pavy, RN, BSN, CEN [facility] [phone]

## 2021-05-16 ENCOUNTER — Other Ambulatory Visit: Payer: Self-pay | Admitting: Family Medicine

## 2021-05-16 ENCOUNTER — Telehealth: Payer: Self-pay

## 2021-05-16 DIAGNOSIS — G8929 Other chronic pain: Secondary | ICD-10-CM

## 2021-05-16 DIAGNOSIS — D571 Sickle-cell disease without crisis: Secondary | ICD-10-CM

## 2021-05-16 MED ORDER — OXYCODONE-ACETAMINOPHEN 10-325 MG PO TABS: 1.0000 | ORAL_TABLET | ORAL | 0 refills | Status: DC | PRN

## 2021-05-16 NOTE — Telephone Encounter (Signed)
Percocet

## 2021-05-16 NOTE — Progress Notes (Signed)
Reviewed PDMP substance reporting system prior to prescribing opiate medications. No inconsistencies noted.  Meds ordered this encounter  Medications   oxyCODONE-acetaminophen (PERCOCET) 10-325 MG tablet    Sig: Take 1 tablet by mouth every 4 (four) hours as needed for pain.    Dispense:  90 tablet    Refill:  0    Order Specific Question:   Supervising Provider    Answer:   JEGEDE, OLUGBEMIGA E [[id number]]   Arvin Abello Moore Daila Elbert  APRN, [name], [name] Patient Care Center [facility] [address]  [place], [place] [address] [phone]  

## 2021-05-26 ENCOUNTER — Encounter (HOSPITAL_COMMUNITY): Payer: Self-pay

## 2021-05-26 ENCOUNTER — Other Ambulatory Visit: Payer: Self-pay

## 2021-05-26 ENCOUNTER — Inpatient Hospital Stay (HOSPITAL_COMMUNITY): Payer: Medicaid Other

## 2021-05-26 ENCOUNTER — Emergency Department (HOSPITAL_COMMUNITY): Payer: Medicaid Other

## 2021-05-26 ENCOUNTER — Inpatient Hospital Stay (HOSPITAL_COMMUNITY)
Admission: EM | Admit: 2021-05-26 | Discharge: 2021-05-30 | DRG: 811 | Discharge status: Against Medical Advice | Payer: Medicaid Other | Attending: Internal Medicine | Admitting: Internal Medicine

## 2021-05-26 DIAGNOSIS — R0902 Hypoxemia: Secondary | ICD-10-CM | POA: Diagnosis present

## 2021-05-26 DIAGNOSIS — Z5329 Procedure and treatment not carried out because of patient's decision for other reasons: Secondary | ICD-10-CM | POA: Diagnosis present

## 2021-05-26 DIAGNOSIS — Z86718 Personal history of other venous thrombosis and embolism: Secondary | ICD-10-CM

## 2021-05-26 DIAGNOSIS — Z79899 Other long term (current) drug therapy: Secondary | ICD-10-CM

## 2021-05-26 DIAGNOSIS — Z681 Body mass index (BMI) 19 or less, adult: Secondary | ICD-10-CM | POA: Diagnosis not present

## 2021-05-26 DIAGNOSIS — J9601 Acute respiratory failure with hypoxia: Secondary | ICD-10-CM | POA: Diagnosis present

## 2021-05-26 DIAGNOSIS — Z832 Family history of diseases of the blood and blood-forming organs and certain disorders involving the immune mechanism: Secondary | ICD-10-CM

## 2021-05-26 DIAGNOSIS — F419 Anxiety disorder, unspecified: Secondary | ICD-10-CM | POA: Diagnosis present

## 2021-05-26 DIAGNOSIS — Z20822 Contact with and (suspected) exposure to covid-19: Secondary | ICD-10-CM | POA: Diagnosis present

## 2021-05-26 DIAGNOSIS — Y95 Nosocomial condition: Secondary | ICD-10-CM | POA: Diagnosis present

## 2021-05-26 DIAGNOSIS — F1120 Opioid dependence, uncomplicated: Secondary | ICD-10-CM | POA: Diagnosis present

## 2021-05-26 DIAGNOSIS — B20 Human immunodeficiency virus [HIV] disease: Secondary | ICD-10-CM | POA: Diagnosis present

## 2021-05-26 DIAGNOSIS — Z7901 Long term (current) use of anticoagulants: Secondary | ICD-10-CM | POA: Diagnosis not present

## 2021-05-26 DIAGNOSIS — D638 Anemia in other chronic diseases classified elsewhere: Secondary | ICD-10-CM | POA: Diagnosis present

## 2021-05-26 DIAGNOSIS — Z86711 Personal history of pulmonary embolism: Secondary | ICD-10-CM | POA: Diagnosis present

## 2021-05-26 DIAGNOSIS — E559 Vitamin D deficiency, unspecified: Secondary | ICD-10-CM | POA: Diagnosis present

## 2021-05-26 DIAGNOSIS — Z91048 Other nonmedicinal substance allergy status: Secondary | ICD-10-CM | POA: Diagnosis not present

## 2021-05-26 DIAGNOSIS — F411 Generalized anxiety disorder: Secondary | ICD-10-CM | POA: Diagnosis present

## 2021-05-26 DIAGNOSIS — F1190 Opioid use, unspecified, uncomplicated: Secondary | ICD-10-CM | POA: Diagnosis present

## 2021-05-26 DIAGNOSIS — J470 Bronchiectasis with acute lower respiratory infection: Secondary | ICD-10-CM | POA: Diagnosis present

## 2021-05-26 DIAGNOSIS — J189 Pneumonia, unspecified organism: Secondary | ICD-10-CM | POA: Diagnosis present

## 2021-05-26 DIAGNOSIS — G894 Chronic pain syndrome: Secondary | ICD-10-CM | POA: Diagnosis present

## 2021-05-26 DIAGNOSIS — Z21 Asymptomatic human immunodeficiency virus [HIV] infection status: Secondary | ICD-10-CM | POA: Diagnosis present

## 2021-05-26 DIAGNOSIS — D5700 Hb-SS disease with crisis, unspecified: Secondary | ICD-10-CM | POA: Diagnosis present

## 2021-05-26 DIAGNOSIS — Z888 Allergy status to other drugs, medicaments and biological substances status: Secondary | ICD-10-CM

## 2021-05-26 LAB — CBC WITH DIFFERENTIAL/PLATELET
Abs Immature Granulocytes: 0.12 10*3/uL — ABNORMAL HIGH (ref 0.00–0.07)
Basophils Absolute: 0.1 10*3/uL (ref 0.0–0.1)
Basophils Relative: 0 %
Eosinophils Absolute: 0.2 10*3/uL (ref 0.0–0.5)
Eosinophils Relative: 1 %
HCT: 20.7 % — ABNORMAL LOW (ref 39.0–52.0)
Hemoglobin: 7.9 g/dL — ABNORMAL LOW (ref 13.0–17.0)
Immature Granulocytes: 1 %
Lymphocytes Relative: 34 %
Lymphs Abs: 7.3 10*3/uL — ABNORMAL HIGH (ref 0.7–4.0)
MCH: 38.2 pg — ABNORMAL HIGH (ref 26.0–34.0)
MCHC: 38.2 g/dL — ABNORMAL HIGH (ref 30.0–36.0)
MCV: 100 fL (ref 80.0–100.0)
Monocytes Absolute: 1.5 10*3/uL — ABNORMAL HIGH (ref 0.1–1.0)
Monocytes Relative: 7 %
Neutro Abs: 12.5 10*3/uL — ABNORMAL HIGH (ref 1.7–7.7)
Neutrophils Relative %: 57 %
Platelets: 309 10*3/uL (ref 150–400)
RBC: 2.07 MIL/uL — ABNORMAL LOW (ref 4.22–5.81)
RDW: 26.1 % — ABNORMAL HIGH (ref 11.5–15.5)
WBC: 21.6 10*3/uL — ABNORMAL HIGH (ref 4.0–10.5)
nRBC: 13.3 % — ABNORMAL HIGH (ref 0.0–0.2)

## 2021-05-26 LAB — COMPREHENSIVE METABOLIC PANEL
ALT: 13 U/L (ref 0–44)
AST: 24 U/L (ref 15–41)
Albumin: 3.3 g/dL — ABNORMAL LOW (ref 3.5–5.0)
Alkaline Phosphatase: 55 U/L (ref 38–126)
Anion gap: 5 (ref 5–15)
BUN: 10 mg/dL (ref 6–20)
CO2: 24 mmol/L (ref 22–32)
Calcium: 8.2 mg/dL — ABNORMAL LOW (ref 8.9–10.3)
Chloride: 108 mmol/L (ref 98–111)
Creatinine, Ser: 0.88 mg/dL (ref 0.61–1.24)
GFR, Estimated: >60 mL/min (ref >60)
Glucose, Bld: 101 mg/dL — ABNORMAL HIGH (ref 70–99)
Potassium: 3.6 mmol/L (ref 3.5–5.1)
Sodium: 137 mmol/L (ref 135–145)
Total Bilirubin: 4.6 mg/dL — ABNORMAL HIGH (ref 0.3–1.2)
Total Protein: 8.6 g/dL — ABNORMAL HIGH (ref 6.5–8.1)

## 2021-05-26 LAB — RETICULOCYTES
Immature Retic Fract: 23.6 % — ABNORMAL HIGH (ref 2.3–15.9)
RBC.: 2.1 MIL/uL — ABNORMAL LOW (ref 4.22–5.81)
Retic Count, Absolute: 548.5 10*3/uL — ABNORMAL HIGH (ref 19.0–186.0)
Retic Ct Pct: 26.1 % — ABNORMAL HIGH (ref 0.4–3.1)

## 2021-05-26 LAB — RESP PANEL BY RT-PCR (FLU A&B, COVID) ARPGX2
Influenza A by PCR: NEGATIVE
Influenza B by PCR: NEGATIVE
SARS Coronavirus 2 by RT PCR: NEGATIVE

## 2021-05-26 MED ORDER — HYDROMORPHONE 1 MG/ML IV SOLN
INTRAVENOUS | Status: DC
  Administered 2021-05-26: 30 mg via INTRAVENOUS
  Administered 2021-05-27: 5.5 mg via INTRAVENOUS
  Administered 2021-05-27: 9 mg via INTRAVENOUS
  Administered 2021-05-27: 6 mg via INTRAVENOUS
  Administered 2021-05-27 – 2021-05-28 (×2): 30 mg via INTRAVENOUS
  Administered 2021-05-28: 4.5 mg via INTRAVENOUS
  Administered 2021-05-28: 6 mg via INTRAVENOUS
  Administered 2021-05-28: 10.4 mg via INTRAVENOUS
  Administered 2021-05-28: 30 mg via INTRAVENOUS
  Administered 2021-05-29: 10.5 mg via INTRAVENOUS
  Administered 2021-05-29: 4 mL via INTRAVENOUS
  Administered 2021-05-29: 9.47 mL via INTRAVENOUS
  Administered 2021-05-29: 0 mL via INTRAVENOUS
  Administered 2021-05-29 – 2021-05-30 (×2): 5.5 mg via INTRAVENOUS
  Administered 2021-05-30: 6.5 mg via INTRAVENOUS
  Administered 2021-05-30: 10 mg via INTRAVENOUS
  Administered 2021-05-30: 3 mg via INTRAVENOUS
  Administered 2021-05-30: 2 mg via INTRAVENOUS
  Administered 2021-05-30: 0.5 mg via INTRAVENOUS
  Filled 2021-05-26 (×6): qty 30

## 2021-05-26 MED ORDER — LORATADINE 10 MG PO TABS
10.0000 mg | ORAL_TABLET | Freq: Every day | ORAL | Status: DC
  Administered 2021-05-27 – 2021-05-30 (×3): 10 mg via ORAL
  Filled 2021-05-26 (×3): qty 1

## 2021-05-26 MED ORDER — HYDROMORPHONE HCL 2 MG/ML IJ SOLN
2.0000 mg | Freq: Once | INTRAMUSCULAR | Status: AC
  Administered 2021-05-26: 2 mg via INTRAVENOUS
  Filled 2021-05-26: qty 1

## 2021-05-26 MED ORDER — POLYETHYLENE GLYCOL 3350 17 G PO PACK
17.0000 g | PACK | Freq: Every day | ORAL | Status: DC | PRN
  Administered 2021-05-28: 17 g via ORAL
  Filled 2021-05-26: qty 1

## 2021-05-26 MED ORDER — CYCLOBENZAPRINE HCL 10 MG PO TABS
10.0000 mg | ORAL_TABLET | Freq: Three times a day (TID) | ORAL | Status: DC | PRN
  Administered 2021-05-28 – 2021-05-29 (×3): 10 mg via ORAL
  Filled 2021-05-26 (×3): qty 1

## 2021-05-26 MED ORDER — DIPHENHYDRAMINE HCL 25 MG PO CAPS
25.0000 mg | ORAL_CAPSULE | Freq: Four times a day (QID) | ORAL | Status: DC | PRN
  Administered 2021-05-26: 25 mg via ORAL
  Filled 2021-05-26: qty 1

## 2021-05-26 MED ORDER — NALOXONE HCL 0.4 MG/ML IJ SOLN: 0.4000 mg | INTRAMUSCULAR | Status: DC | PRN

## 2021-05-26 MED ORDER — HYDROXYUREA 500 MG PO CAPS
2000.0000 mg | ORAL_CAPSULE | Freq: Every day | ORAL | Status: DC
  Administered 2021-05-26 – 2021-05-29 (×4): 2000 mg via ORAL
  Filled 2021-05-26 (×4): qty 4

## 2021-05-26 MED ORDER — DEXTROSE-NACL 5-0.45 % IV SOLN: INTRAVENOUS | Status: DC

## 2021-05-26 MED ORDER — ALPRAZOLAM 1 MG PO TABS
1.0000 mg | ORAL_TABLET | Freq: Three times a day (TID) | ORAL | Status: DC | PRN
  Administered 2021-05-27 – 2021-05-30 (×3): 1 mg via ORAL
  Filled 2021-05-26 (×3): qty 1

## 2021-05-26 MED ORDER — SENNOSIDES-DOCUSATE SODIUM 8.6-50 MG PO TABS
1.0000 | ORAL_TABLET | Freq: Two times a day (BID) | ORAL | Status: DC
  Administered 2021-05-26 – 2021-05-30 (×5): 1 via ORAL
  Filled 2021-05-26 (×7): qty 1

## 2021-05-26 MED ORDER — DIPHENHYDRAMINE HCL 25 MG PO CAPS: 25.0000 mg | ORAL_CAPSULE | ORAL | Status: DC | PRN

## 2021-05-26 MED ORDER — HYDROXYUREA 500 MG PO CAPS
2000.0000 mg | ORAL_CAPSULE | Freq: Every day | ORAL | Status: DC
  Filled 2021-05-26: qty 4

## 2021-05-26 MED ORDER — ONDANSETRON HCL 4 MG/2ML IJ SOLN: 4.0000 mg | Freq: Four times a day (QID) | INTRAMUSCULAR | Status: DC | PRN

## 2021-05-26 MED ORDER — ADULT MULTIVITAMIN W/MINERALS CH
1.0000 | ORAL_TABLET | Freq: Every day | ORAL | Status: DC
  Administered 2021-05-27 – 2021-05-30 (×3): 1 via ORAL
  Filled 2021-05-26 (×4): qty 1

## 2021-05-26 MED ORDER — HYDROMORPHONE HCL 1 MG/ML IJ SOLN
1.0000 mg | Freq: Once | INTRAMUSCULAR | Status: AC
  Administered 2021-05-26: 1 mg via INTRAVENOUS
  Filled 2021-05-26: qty 1

## 2021-05-26 MED ORDER — SODIUM CHLORIDE 0.9% FLUSH: 9.0000 mL | INTRAVENOUS | Status: DC | PRN

## 2021-05-26 MED ORDER — ABACAVIR-DOLUTEGRAVIR-LAMIVUD 600-50-300 MG PO TABS
1.0000 | ORAL_TABLET | Freq: Every day | ORAL | Status: DC
  Administered 2021-05-26 – 2021-05-29 (×4): 1 via ORAL
  Filled 2021-05-26 (×5): qty 1

## 2021-05-26 MED ORDER — HYDROMORPHONE HCL 2 MG/ML IJ SOLN
2.0000 mg | INTRAMUSCULAR | Status: AC
  Administered 2021-05-26: 2 mg via INTRAVENOUS
  Filled 2021-05-26: qty 1

## 2021-05-26 MED ORDER — APIXABAN 5 MG PO TABS
5.0000 mg | ORAL_TABLET | Freq: Two times a day (BID) | ORAL | Status: DC
  Administered 2021-05-26 – 2021-05-30 (×9): 5 mg via ORAL
  Filled 2021-05-26 (×9): qty 1

## 2021-05-26 MED ORDER — HYDROMORPHONE HCL 2 MG/ML IJ SOLN
2.0000 mg | INTRAMUSCULAR | Status: DC | PRN
  Filled 2021-05-26: qty 1

## 2021-05-26 MED ORDER — ONDANSETRON HCL 4 MG/2ML IJ SOLN
4.0000 mg | INTRAMUSCULAR | Status: DC | PRN
  Administered 2021-05-26: 4 mg via INTRAVENOUS
  Filled 2021-05-26: qty 2

## 2021-05-26 NOTE — ED Provider Notes (Signed)
Care assumed from [facility], [place], at shift change, please see their notes for full documentation of patient's complaint/HPI. Briefly, pt here with typical pain from sickle cell pain crisis. Pt with hx of hypoxemia during crisis and initially presented at 80% on RA - currently on 2L [place] which he is prescribed at home PRN. Results so far show unremarkable labs besides WBC of 21,600 which is thought to be secondary to acute sickle cell crisis. CXR without new findings. Awaiting reevaluation of 3rd dose of medication. Plan is to dispo accordingly.   Physical Exam  BP 96/69    Pulse 72    Temp 98.5 F (36.9 C) (Oral)    Resp 14    Wt 68 kg    SpO2 94%    BMI 18.75 kg/m   Physical Exam Vitals and nursing note reviewed.  Constitutional:      Appearance: He is not ill-appearing.  HENT:     Head: Normocephalic and atraumatic.  Eyes:     Conjunctiva/sclera: Conjunctivae normal.  Cardiovascular:     Rate and Rhythm: Normal rate and regular rhythm.  Pulmonary:     Effort: Pulmonary effort is normal.     Breath sounds: Normal breath sounds.  Skin:    General: Skin is warm and dry.     Coloration: Skin is not jaundiced.  Neurological:     Mental Status: He is alert.    Procedures  Procedures  ED Course / MDM   Clinical Course as of 05/26/21 1018  Mon May 26, 2021  0453 On chart review, patient with history of hypoxemia during sickle cell crises.  Uses supplemental oxygen up to 3 LPM at home PRN, but with poor compliance.  Ordered for patient to be placed on O2 via nasal cannula. [KH]  0550 O2 sats 92% on 2L via  [KH]  0551 Chest x-ray reviewed by me.  Results appear stable compared with prior imaging. [KH]  0617 Labs reviewed, notable for leukocytosis of 21.6 which is favored to be secondary to stress of acute sickle cell crisis.  Hemoglobin is stable at 7.9.  No indication for transfusion. [KH]  0618 Second dose of pain medication ordered. [KH]  [phone] On chart review, patient generally  with systolic blood pressure in the 90s to 100 at baseline.  His tachycardia has improved.  We will continue to monitor. [KH]    Clinical Course User Index [KH] Antony Madura, PA-C   Medical Decision Making Pt received total of 3 doses of IV Dilaudid however continues to have severe pain. Will call sickle cell team for admission at this time.   Amount and/or Complexity of Data Reviewed Labs: ordered. Radiology: ordered. Discussion of management or test interpretation with external provider(s): Discussed case with [place] Hollis NP who will admit patient  Risk Prescription drug management. Decision regarding hospitalization.          Tanda Rockers, PA-C 05/26/21 1018    Horton, Clabe Seal, DO 05/26/21 1540

## 2021-05-26 NOTE — H&P (Signed)
H&P  Patient Demographics:  Martin [name], is a 31 y.o. male  MRN: [id number]   DOB - 1991-03-01  Admit Date - 05/26/2021  Outpatient Primary MD for the patient is [name], [name]  Chief Complaint  Patient presents with   Sickle Cell Pain Crisis      HPI:   Martin [name]  is a 31 y.o. male with a medical history significant for sickle cell disease type SS, chronic pain syndrome, opiate dependence and tolerance, history of HIV, history of PE on Eliquis, generalized anxiety disorder, and anemia of chronic disease presented to the emergency department with complaints of allover body pain that is consistent with his typical pain crisis.  He attributes pain crisis to changes in weather.  He states that pain intensity has been escalating over the past 4 days and unrelieved by his home medications.  He has been taking Percocet 10 mg every 4 hours as needed without sustained relief.  He currently rates his pain as 10/10, constant, and aching.  He denies any headache, fever, chills, chest pain, or shortness of breath.  No urinary symptoms, nausea, vomiting, or diarrhea.  No sick contacts, recent travel, or known exposure to COVID-19.  ER course: Vital signs show:BP 99/63 (BP Location: Left Arm)    Pulse 90    Temp 98.2 F (36.8 C) (Oral)    Resp 14    Wt 68 kg    SpO2 90%    BMI 18.75 kg/m   Complete blood count shows WBC is elevated at 21.6, hemoglobin 7.9 g/dL, and platelets 309,000.  Complete metabolic panel shows total bilirubin 4.6, otherwise unremarkable.  Absolute reticulocyte, 548.5.  Chest x-ray shows chronic rather diffuse interstitial prominence, similar to prior examinations.  No definite acute findings are noted on today's study. Right internal jugular single-lumen PowerPort with tip terminating in the distal superior vena cava.  No pneumothorax or other complicating features.  Patient's pain persists despite IV Dilaudid, and IV fluids.  Patient thereby admitted to [facility] for  further management of sickle cell pain crisis.  Review of systems:  In addition to the HPI above, patient reports No fever or chills No Headache, No changes with vision or hearing No problems swallowing food or liquids No chest pain, cough or shortness of breath No abdominal pain, No nausea or vomiting, Bowel movements are regular No blood in stool or urine No dysuria No new skin rashes or bruises No new joints pains-aches No new weakness, tingling, numbness in any extremity No recent weight gain or loss No polyuria, polydypsia or polyphagia No significant Mental Stressors  With Past History of the following :   Past Medical History:  Diagnosis Date   Anxiety    HIV (human immunodeficiency virus infection) ([place])    Proteinuria    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[place])    Vitamin D deficiency 10/2018      Past Surgical History:  Procedure Laterality Date   IR IMAGING GUIDED PORT INSERTION  08/29/2019   IR REMOVAL TUN ACCESS W/ PORT W/O FL MOD SED  08/30/2020   TEE WITHOUT CARDIOVERSION N/A 08/30/2020   Procedure: TRANSESOPHAGEAL ECHOCARDIOGRAM (TEE);  Surgeon: Pixie Casino, MD;  Location: [facility] ENDOSCOPY;  Service: Cardiovascular;  Laterality: N/A;     Social History:   Social History   Tobacco Use   Smoking status: Never   Smokeless tobacco: Never  Substance Use Topics   Alcohol use: No     Lives - At home   Family History :  Family History  Problem Relation Age of Onset   Sickle cell trait Mother    Sickle cell trait Father    Birth defects Maternal Grandmother    Birth defects Paternal Grandmother      Home Medications:   Prior to Admission medications   Medication Sig Start Date End Date Taking? Authorizing Provider  abacavir-dolutegravir-lamiVUDine (TRIUMEQ) 600-50-300 MG tablet Take 1 tablet by mouth at bedtime.    Yes [provider]  acetaminophen (TYLENOL) 325 MG tablet Take 650 mg by mouth every 6 (six) hours as needed for pain or headache. 11/28/20   Yes [provider]  ALPRAZolam Duanne Moron) 1 MG tablet Take 1 mg by mouth 3 (three) times daily as needed for anxiety.   Yes [provider]  apixaban (ELIQUIS) 5 MG TABS tablet Take 5 mg by mouth 2 (two) times daily.   Yes [provider]  cetirizine (ZYRTEC ALLERGY) 10 MG tablet Take 1 tablet (10 mg total) by mouth daily as needed for allergies. 04/18/21 04/18/22 Yes [name], [name]  cyclobenzaprine (FLEXERIL) 10 MG tablet Take 10 mg by mouth 3 (three) times daily as needed for muscle spasms. 03/04/21  Yes [provider]  hydroxyurea (HYDREA) 500 MG capsule Take 4 capsules (2,000 mg total) by mouth at bedtime. 10/07/20  Yes [name], [name]  ibuprofen (ADVIL) 600 MG tablet Take 600 mg by mouth 3 (three) times daily as needed for pain. 05/20/21  Yes [provider]  melatonin 3 MG TABS tablet Take 6 mg by mouth daily as needed for sleep. 03/04/21  Yes [provider]  Multiple Vitamin (MULTIVITAMIN WITH MINERALS) TABS tablet Take 1 tablet by mouth daily with breakfast.    Yes [provider]  naloxone (NARCAN) nasal spray 4 mg/0.1 mL Place 4 mg into the nose as needed for opioid reversal. 05/19/17  Yes [provider]  oxyCODONE-acetaminophen (PERCOCET) 10-325 MG tablet Take 1 tablet by mouth every 4 (four) hours as needed for pain. 05/16/21 05/16/22 Yes [name], [name]  promethazine (PHENERGAN) 12.5 MG tablet Take 12.5 mg by mouth every 8 (eight) hours as needed for nausea/vomiting. 02/04/21  Yes [provider]  SENEXON-S 8.6-50 MG tablet Take 1 tablet by mouth daily as needed for constipation. 05/20/21  Yes [provider]  Vitamin D, Ergocalciferol, (DRISDOL) 1.25 MG (50000 UT) CAPS capsule Take 1 capsule (50,000 Units total) by mouth every 7 (seven) days. Patient taking differently: Take 50,000 Units by mouth every Tuesday. 11/01/18  Yes Azzie Glatter, [name]  doxycycline (VIBRA-TABS) 100 MG  tablet Take 100 mg by mouth 2 (two) times daily. 05/24/21   [provider]     Allergies:   Allergies  Allergen Reactions   Ketorolac Tromethamine Swelling and Other (See Comments)    Patient reports facial edema and left arm edema after administration.   Tape Rash and Other (See Comments)    PLEASE DO NOT USE THE CLEAR, THICK, "PLASTIC" TAPE- only paper tape is tolerated    Wound Dressing Adhesive Rash     Physical Exam:   Vitals:   Vitals:   05/26/21 0845 05/26/21 0948  BP: 97/64 96/69  Pulse: 83 72  Resp: 15 14  Temp:    SpO2: 92% 94%    Physical Exam: Constitutional: Patient appears well-developed and well-nourished. Not in obvious distress. HENT: Normocephalic, atraumatic, External right and left ear normal. Oropharynx is clear and moist.  Eyes: Conjunctivae and EOM are normal. PERRLA, no  scleral icterus. Neck: Normal ROM. Neck supple. No JVD. No tracheal deviation. No thyromegaly. CVS: RRR, S1/S2 +, no murmurs, no gallops, no carotid bruit.  Pulmonary: Effort and breath sounds normal, no stridor, rhonchi, wheezes, rales.  Abdominal: Soft. BS +, no distension, tenderness, rebound or guarding.  Musculoskeletal: Normal range of motion. No edema and no tenderness.  Lymphadenopathy: No lymphadenopathy noted, cervical, inguinal or axillary Neuro: Alert. Normal reflexes, muscle tone coordination. No cranial nerve deficit. Skin: Skin is warm and dry. No rash noted. Not diaphoretic. No erythema. No pallor. Psychiatric: Normal mood and affect. Behavior, judgment, thought content normal.   Data Review:   CBC Recent Labs  Lab 05/26/21 0530  WBC 21.6*  HGB 7.9*  HCT 20.7*  PLT 309  MCV 100.0  MCH 38.2*  MCHC 38.2*  RDW 26.1*  LYMPHSABS 7.3*  MONOABS 1.5*  EOSABS 0.2  BASOSABS 0.1   ------------------------------------------------------------------------------------------------------------------  Chemistries  Recent Labs  Lab 05/26/21 0530  NA 137  K  3.6  CL 108  CO2 24  GLUCOSE 101*  BUN 10  CREATININE 0.88  CALCIUM 8.2*  AST 24  ALT 13  ALKPHOS 55  BILITOT 4.6*   ------------------------------------------------------------------------------------------------------------------ estimated creatinine clearance is 117 mL/min (by C-G formula based on SCr of 0.88 mg/dL). ------------------------------------------------------------------------------------------------------------------ No results for input(s): TSH, T4TOTAL, T3FREE, THYROIDAB in the last 72 hours.  Invalid input(s): FREET3  Coagulation profile No results for input(s): INR, PROTIME in the last 168 hours. ------------------------------------------------------------------------------------------------------------------- No results for input(s): DDIMER in the last 72 hours. -------------------------------------------------------------------------------------------------------------------  Cardiac Enzymes No results for input(s): CKMB, TROPONINI, MYOGLOBIN in the last 168 hours.  Invalid input(s): CK ------------------------------------------------------------------------------------------------------------------ No results found for: BNP  ---------------------------------------------------------------------------------------------------------------  Urinalysis    Component Value Date/Time   COLORURINE YELLOW 08/29/2020 [place] 08/29/2020 0245   LABSPEC 1.013 08/29/2020 0245   PHURINE 7.0 08/29/2020 0245   GLUCOSEU NEGATIVE 08/29/2020 0245   HGBUR NEGATIVE 08/29/2020 0245   BILIRUBINUR small (A) 03/18/2021 1218   BILIRUBINUR neg 11/27/2019 1149   KETONESUR negative 03/18/2021 [place] 08/29/2020 0245   PROTEINUR NEGATIVE 08/29/2020 0245   UROBILINOGEN 2.0 (A) 03/18/2021 1218   UROBILINOGEN 4.0 (H) 05/21/2009 1944   NITRITE Negative 03/18/2021 1218   NITRITE NEGATIVE 08/29/2020 0245   LEUKOCYTESUR Trace (A) 03/18/2021 1218    LEUKOCYTESUR TRACE (A) 08/29/2020 0245    ----------------------------------------------------------------------------------------------------------------   Imaging Results:    DG Chest Port 1 View  Result Date: 05/26/2021 CLINICAL DATA:  31 year old male with history of hypoxemia. Sickle cell crisis. EXAM: PORTABLE CHEST 1 VIEW COMPARISON:  Chest x-ray 04/09/2021. FINDINGS: Right internal jugular single-lumen power porta cath with tip terminating in the distal superior vena cava. Widespread areas of interstitial prominence are again noted throughout the lungs bilaterally, similar in appearance to the prior study. No confluent consolidative airspace disease. No pleural effusions. No pneumothorax. No suspicious appearing pulmonary nodules or masses are noted. No evidence of pulmonary edema. Heart size is normal. Upper mediastinal contours are within normal limits. IMPRESSION: 1. Chronic rather diffuse interstitial prominence, similar to prior examinations. No definite new acute findings are noted on today's study. 2. New right internal jugular single-lumen power porta cath with tip terminating in the distal superior vena cava. No pneumothorax or other acute complicating features. Electronically Signed   By: Vinnie Langton M.D.   On: 05/26/2021 05:20     Assessment & Plan:  Principal Problem:   Sickle cell pain crisis [facility]) Active Problems:  Generalized anxiety disorder   Human immunodeficiency virus (HIV) disease ([place])   History of pulmonary embolus (PE)   Hypoxia   Chronic, continuous use of opioids  Sickle cell disease with pain crisis: Admit to MedSurg.  Continue IV fluids, 0.45% saline at 75 mL/h Initiate IV Dilaudid PCA with settings of 0.5 mg, 10-minute lockout, and 3 mg/h No IV Toradol, contraindicated due to allergy Will not restart oral home medications at this time, will restart as pain intensity improves. Monitor vital signs very closely, reevaluate pain scale regularly, and  supplemental oxygen as needed Will be reevaluated for pain in the context of function and relationship to baseline as care progresses.  Leukocytosis: WBCs 21.5.  Patient is afebrile.  Chest x-ray shows no acute cardiopulmonary process.  Will review urinalysis as results become available.  COVID-19 and influenza negative.  Follow closely. Labs in a.m.  Anemia of chronic disease: Hemoglobin 7.6 g/dL.  Baseline is 7-8 g/dL.  We will repeat CBC in a.m. transfuse 1 unit PRBCs if hemoglobin is less than 7.0 g/dL.  Continue hydroxyurea and folic acid.  Generalized anxiety: Continue home medications as needed.  No suicidal or homicidal intentions today.  Follow closely.  Chronic pain syndrome: We will restart home medications as pain intensity improves.  History of HIV disease: Continue Triumeq daily.  Patient will follow-up with infectious disease as an outpatient.  History of pulmonary embolism: Continue Eliquis  DVT Prophylaxis: Eliquis  AM Labs Ordered, also please review Full Orders  Family Communication: Admission, patient's condition and plan of care including tests being ordered have been discussed with the patient who indicate understanding and agree with the plan and Code Status.  Code Status: Full Code  Consults called: None    Admission status: Inpatient    Time spent in minutes : 50 minutes  [place], [name], [name] Patient [facility] [address] [place], [place] [address] [phone]  05/26/2021 at 11:20 AM

## 2021-05-26 NOTE — ED Triage Notes (Signed)
Pt reports with [facility] since last night. Pt states that everything hurts. Last medication Percocet 10 mg around 0230.

## 2021-05-26 NOTE — ED Provider Notes (Signed)
[facility] DEPT Provider Note   CSN: [id number] Arrival date & time: 05/26/21  0433     History  Chief Complaint  Patient presents with   Sickle Cell Pain Crisis    Martin [name] is a 31 y.o. male.  31 year old male with a history of sickle cell SS anemia, DVT/PE (on Eliquis) presents to the emergency department for evaluation of a constant, aching pain in his back, hips, shoulders which began at 2230 tonight.  Atraumatic in onset. The patient has taken his home medications including his Percocet tablets for pain without relief.  Patient has had similar pain associated with sickle cell crisis.  He denies fevers, chest pain, shortness of breath, vomiting, cough, abdominal pain.  The history is provided by the patient. No language interpreter was used.  Sickle Cell Pain Crisis     Home Medications Prior to Admission medications   Medication Sig Start Date End Date Taking? Authorizing Provider  abacavir-dolutegravir-lamiVUDine (TRIUMEQ) 600-50-300 MG tablet Take 1 tablet by mouth at bedtime.     [provider]  acetaminophen (TYLENOL) 325 MG tablet Take 650 mg by mouth every 6 (six) hours as needed for pain or headache. 11/28/20   [provider]  ALPRAZolam Duanne Moron) 1 MG tablet Take 1 mg by mouth 3 (three) times daily as needed for anxiety.    [provider]  apixaban (ELIQUIS) 5 MG TABS tablet Take 5 mg by mouth 2 (two) times daily.    [provider]  cetirizine (ZYRTEC ALLERGY) 10 MG tablet Take 1 tablet (10 mg total) by mouth daily as needed for allergies. 04/18/21 04/18/22  [name], [name]  cyclobenzaprine (FLEXERIL) 10 MG tablet Take 10 mg by mouth 3 (three) times daily. 03/04/21   [provider]  hydroxyurea (HYDREA) 500 MG capsule Take 4 capsules (2,000 mg total) by mouth at bedtime. 10/07/20   [name], [name]  melatonin 3 MG TABS tablet Take 6 mg by mouth daily as needed for sleep.  03/04/21   [provider]  Multiple Vitamin (MULTIVITAMIN WITH MINERALS) TABS tablet Take 1 tablet by mouth daily with breakfast.     [provider]  naloxone (NARCAN) nasal spray 4 mg/0.1 mL Place 4 mg into the nose as needed for opioid reversal. 05/19/17   [provider]  oxyCODONE-acetaminophen (PERCOCET) 10-325 MG tablet Take 1 tablet by mouth every 4 (four) hours as needed for pain. 05/16/21 05/16/22  [name], [name]  promethazine (PHENERGAN) 12.5 MG tablet Take 12.5 mg by mouth every 8 (eight) hours as needed for nausea/vomiting. 02/04/21   [provider]  Vitamin D, Ergocalciferol, (DRISDOL) 1.25 MG (50000 UT) CAPS capsule Take 1 capsule (50,000 Units total) by mouth every 7 (seven) days. Patient taking differently: Take 50,000 Units by mouth every Tuesday. 11/01/18   Azzie Glatter, [name]      Allergies    Ketorolac tromethamine, Tape, and Wound dressing adhesive    Review of Systems   Review of Systems Ten systems reviewed and are negative for acute change, except as noted in the HPI.    Physical Exam Updated Vital Signs BP (!) 91/57    Pulse 98    Temp 98.5 F (36.9 C) (Oral)    Resp 20    SpO2 93%   Physical Exam Vitals and nursing note reviewed.  Constitutional:      General: He is not in acute distress.    Appearance: He is well-developed. He  is not diaphoretic.     Comments: Nontoxic appearing and in NAD  HENT:     Head: Normocephalic and atraumatic.  Eyes:     General: No scleral icterus.    Conjunctiva/sclera: Conjunctivae normal.  Cardiovascular:     Rate and Rhythm: Regular rhythm. Tachycardia present.     Pulses: Normal pulses.  Pulmonary:     Effort: Pulmonary effort is normal. No respiratory distress.     Breath sounds: No wheezing, rhonchi or rales.     Comments: Respirations even and unlabored.  Lungs grossly clear bilaterally. Musculoskeletal:        General: Normal range of motion.     Cervical back: Normal  range of motion.  Skin:    General: Skin is warm and dry.     Coloration: Skin is not pale.     Findings: No erythema or rash.  Neurological:     Mental Status: He is alert and oriented to person, place, and time.  Psychiatric:        Behavior: Behavior normal.    ED Results / Procedures / Treatments   Labs (all labs ordered are listed, but only abnormal results are displayed) Labs Reviewed  CBC WITH DIFFERENTIAL/PLATELET - Abnormal; Notable for the following components:      Result Value   WBC 21.6 (*)    RBC 2.07 (*)    Hemoglobin 7.9 (*)    HCT 20.7 (*)    MCH 38.2 (*)    MCHC 38.2 (*)    RDW 26.1 (*)    nRBC 13.3 (*)    Neutro Abs 12.5 (*)    Lymphs Abs 7.3 (*)    Monocytes Absolute 1.5 (*)    Abs Immature Granulocytes 0.12 (*)    All other components within normal limits  COMPREHENSIVE METABOLIC PANEL - Abnormal; Notable for the following components:   Glucose, Bld 101 (*)    Calcium 8.2 (*)    Total Protein 8.6 (*)    Albumin 3.3 (*)    Total Bilirubin 4.6 (*)    All other components within normal limits  RETICULOCYTES - Abnormal; Notable for the following components:   Retic Ct Pct 26.1 (*)    RBC. 2.10 (*)    Retic Count, Absolute 548.5 (*)    Immature Retic Fract 23.6 (*)    All other components within normal limits    EKG None  Radiology DG Chest Port 1 View  Result Date: 05/26/2021 CLINICAL DATA:  31 year old male with history of hypoxemia. Sickle cell crisis. EXAM: PORTABLE CHEST 1 VIEW COMPARISON:  Chest x-ray 04/09/2021. FINDINGS: Right internal jugular single-lumen power porta cath with tip terminating in the distal superior vena cava. Widespread areas of interstitial prominence are again noted throughout the lungs bilaterally, similar in appearance to the prior study. No confluent consolidative airspace disease. No pleural effusions. No pneumothorax. No suspicious appearing pulmonary nodules or masses are noted. No evidence of pulmonary edema. Heart  size is normal. Upper mediastinal contours are within normal limits. IMPRESSION: 1. Chronic rather diffuse interstitial prominence, similar to prior examinations. No definite new acute findings are noted on today's study. 2. New right internal jugular single-lumen power porta cath with tip terminating in the distal superior vena cava. No pneumothorax or other acute complicating features. Electronically Signed   By: Vinnie Langton M.D.   On: 05/26/2021 05:20    Procedures Procedures    Medications Ordered in ED Medications  dextrose 5 %-0.45 % sodium chloride infusion (  Intravenous New Bag/Given 05/26/21 0556)  diphenhydrAMINE (BENADRYL) capsule 25-50 mg (25 mg Oral Given 05/26/21 0544)  ondansetron (ZOFRAN) injection 4 mg (4 mg Intravenous Given 05/26/21 0538)  HYDROmorphone (DILAUDID) injection 1 mg (has no administration in time range)  HYDROmorphone (DILAUDID) injection 2 mg (2 mg Intravenous Given 05/26/21 0536)    ED Course/ Medical Decision Making/ A&P Clinical Course as of 05/26/21 [id number]  [facility] May 26, 2021  0453 On chart review, patient with history of hypoxemia during sickle cell crises.  Uses supplemental oxygen up to 3 LPM at home PRN, but with poor compliance.  Ordered for patient to be placed on O2 via nasal cannula. [KH]  0550 O2 sats 92% on 2L via [place] [KH]  0551 Chest x-ray reviewed by me.  Results appear stable compared with prior imaging. [KH]  0617 Labs reviewed, notable for leukocytosis of 21.6 which is favored to be secondary to stress of acute sickle cell crisis.  Hemoglobin is stable at 7.9.  No indication for transfusion. [KH]  0618 Second dose of pain medication ordered. [KH]  [phone] On chart review, patient generally with systolic blood pressure in the 90s to 100 at baseline.  His tachycardia has improved.  We will continue to monitor. [KH]    Clinical Course User Index [KH] Antonietta Breach, PA-C                           Medical Decision Making Amount and/or Complexity of  Data Reviewed Labs: ordered. Radiology: ordered.  Risk Prescription drug management.   31 year old male with a history of sickle cell SS anemia presents to the emergency department for complaints of pain to his shoulders, hips, back which is consistent with past episodes of sickle cell crisis pain.  Pain unrelieved with home narcotics.  His evaluation in the emergency department has been stable.  He denies chest pain, shortness of breath, though was notably hypoxic on arrival.  Per chart review, has history of hypoxemia associated with sickle cell crisis.  Is prescribed 3 L oxygen via nasal cannula for home use as needed, but is noted to have poor compliance with this.  SpO2 improved to 93% on 2L.  CXR findings stable and unchanged compared to prior.  Pending reassessment following IV pain medications. Based on historical ED visits, may necessitate admission for ongoing pain management. Care signed out to [place], PA-C at shift change.        Final Clinical Impression(s) / ED Diagnoses Final diagnoses:  Sickle cell pain crisis [facility])    Rx / DC Orders ED Discharge Orders     None         Antonietta Breach, PA-C [phone]    Delora Fuel, MD 0000000 [phone]

## 2021-05-27 ENCOUNTER — Inpatient Hospital Stay (HOSPITAL_COMMUNITY): Payer: Medicaid Other

## 2021-05-27 DIAGNOSIS — D5700 Hb-SS disease with crisis, unspecified: Secondary | ICD-10-CM | POA: Diagnosis not present

## 2021-05-27 LAB — BASIC METABOLIC PANEL
Anion gap: 4 — ABNORMAL LOW (ref 5–15)
BUN: 9 mg/dL (ref 6–20)
CO2: 24 mmol/L (ref 22–32)
Calcium: 8.4 mg/dL — ABNORMAL LOW (ref 8.9–10.3)
Chloride: 107 mmol/L (ref 98–111)
Creatinine, Ser: 0.69 mg/dL (ref 0.61–1.24)
GFR, Estimated: >60 mL/min (ref >60)
Glucose, Bld: 93 mg/dL (ref 70–99)
Potassium: 4.2 mmol/L (ref 3.5–5.1)
Sodium: 135 mmol/L (ref 135–145)

## 2021-05-27 LAB — CBC
HCT: 20.3 % — ABNORMAL LOW (ref 39.0–52.0)
Hemoglobin: 7.6 g/dL — ABNORMAL LOW (ref 13.0–17.0)
MCH: 37.8 pg — ABNORMAL HIGH (ref 26.0–34.0)
MCHC: 37.4 g/dL — ABNORMAL HIGH (ref 30.0–36.0)
MCV: 101 fL — ABNORMAL HIGH (ref 80.0–100.0)
Platelets: 276 10*3/uL (ref 150–400)
RBC: 2.01 MIL/uL — ABNORMAL LOW (ref 4.22–5.81)
RDW: 23.2 % — ABNORMAL HIGH (ref 11.5–15.5)
WBC: 13.1 10*3/uL — ABNORMAL HIGH (ref 4.0–10.5)
nRBC: 10.3 % — ABNORMAL HIGH (ref 0.0–0.2)

## 2021-05-27 LAB — BRAIN NATRIURETIC PEPTIDE: B Natriuretic Peptide: 91.4 pg/mL (ref 0.0–100.0)

## 2021-05-27 LAB — LACTATE DEHYDROGENASE: LDH: 367 U/L — ABNORMAL HIGH (ref 98–192)

## 2021-05-27 MED ORDER — OXYCODONE HCL 5 MG PO TABS
10.0000 mg | ORAL_TABLET | ORAL | Status: DC | PRN
  Administered 2021-05-27 – 2021-05-30 (×10): 10 mg via ORAL
  Filled 2021-05-27 (×10): qty 2

## 2021-05-27 MED ORDER — CHLORHEXIDINE GLUCONATE CLOTH 2 % EX PADS
6.0000 | MEDICATED_PAD | Freq: Every day | CUTANEOUS | Status: DC
  Administered 2021-05-27 – 2021-05-30 (×4): 6 via TOPICAL

## 2021-05-27 NOTE — Progress Notes (Signed)
Subjective: Martin [name] is a 31 year old male with a medical history significant for sickle cell disease, chronic pain syndrome, opiate dependence and tolerance, history of PE on Eliquis, history of HIV, and history of anemia of chronic disease was admitted for sickle cell pain crisis. Patient continues to have significant allover body pain.  Patient rates his pain as 9/10.  He denies any headache, chest pain, shortness of breath, urinary symptoms, nausea, vomiting, or diarrhea.  Objective:  Vital signs in last 24 hours:  Vitals:   05/27/21 0348 05/27/21 0434 05/27/21 0555 05/27/21 0757  BP:  100/71    Pulse:  86    Resp: 17 18  14   Temp:  98.1 F (36.7 C)    TempSrc:  Oral    SpO2: 92% (!) 88% 93% 96%  Weight:      Height:        Intake/Output from previous day:   Intake/Output Summary (Last 24 hours) at 05/27/2021 0835 Last data filed at 05/27/2021 0757 Gross per 24 hour  Intake 2796.76 ml  Output 1350 ml  Net 1446.76 ml    Physical Exam: General: Alert, awake, oriented x3, in no acute distress.  HEENT: [place]/AT PEERL, EOMI Neck: Trachea midline,  no masses, no thyromegal,y no JVD, no carotid bruit OROPHARYNX:  Moist, No exudate/ erythema/lesions.  Heart: Regular rate and rhythm, without murmurs, rubs, gallops, PMI non-displaced, no heaves or thrills on palpation.  Lungs: Clear to auscultation, no wheezing or rhonchi noted. No increased vocal fremitus resonant to percussion  Abdomen: Soft, nontender, nondistended, positive bowel sounds, no masses no hepatosplenomegaly noted..  Neuro: No focal neurological deficits noted cranial nerves II through XII grossly intact. DTRs 2+ bilaterally upper and lower extremities. Strength 5 out of 5 in bilateral upper and lower extremities. Musculoskeletal: No warm swelling or erythema around joints, no spinal tenderness noted. Psychiatric: Patient alert and oriented x3, good insight and cognition, good recent to remote recall. Lymph node  survey: No cervical axillary or inguinal lymphadenopathy noted.  Lab Results:  Basic Metabolic Panel:    Component Value Date/Time   NA 135 05/27/2021 0435   NA 136 03/18/2021 1232   K 4.2 05/27/2021 0435   CL 107 05/27/2021 0435   CO2 24 05/27/2021 0435   BUN 9 05/27/2021 0435   BUN 11 03/18/2021 1232   CREATININE 0.69 05/27/2021 0435   GLUCOSE 93 05/27/2021 0435   CALCIUM 8.4 (L) 05/27/2021 0435   CBC:    Component Value Date/Time   WBC 13.1 (H) 05/27/2021 0435   HGB 7.6 (L) 05/27/2021 0435   HGB 8.3 (L) 03/18/2021 1232   HCT 20.3 (L) 05/27/2021 0435   HCT 22.8 (L) 03/18/2021 1232   PLT 276 05/27/2021 0435   PLT 361 03/18/2021 1232   MCV 101.0 (H) 05/27/2021 0435   MCV 111 (H) 03/18/2021 1232   NEUTROABS 12.5 (H) 05/26/2021 0530   NEUTROABS 6.3 03/18/2021 1232   LYMPHSABS 7.3 (H) 05/26/2021 0530   LYMPHSABS 6.1 (H) 03/18/2021 1232   MONOABS 1.5 (H) 05/26/2021 0530   EOSABS 0.2 05/26/2021 0530   EOSABS 0.2 03/18/2021 1232   BASOSABS 0.1 05/26/2021 0530   BASOSABS 0.1 03/18/2021 1232    Recent Results (from the past 240 hour(s))  Resp Panel by RT-PCR (Flu A&B, Covid) Nasopharyngeal Swab     Status: None   Collection Time: 05/26/21 10:52 AM   Specimen: Nasopharyngeal Swab; Nasopharyngeal(NP) swabs in vial transport medium  Result Value Ref Range Status   SARS Coronavirus  2 by RT PCR NEGATIVE NEGATIVE Final    Comment: (NOTE) SARS-CoV-2 target nucleic acids are NOT DETECTED.  The SARS-CoV-2 RNA is generally detectable in upper respiratory specimens during the acute phase of infection. The lowest concentration of SARS-CoV-2 viral copies this assay can detect is 138 copies/mL. A negative result does not preclude SARS-Cov-2 infection and should not be used as the sole basis for treatment or other patient management decisions. A negative result may occur with  improper specimen collection/handling, submission of specimen other than nasopharyngeal swab, presence of  viral mutation(s) within the areas targeted by this assay, and inadequate number of viral copies(<138 copies/mL). A negative result must be combined with clinical observations, patient history, and epidemiological information. The expected result is Negative.  Fact Sheet for Patients:  BloggerCourse.com  Fact Sheet for Healthcare Providers:  SeriousBroker.it  This test is no t yet approved or cleared by the [place] FDA and  has been authorized for detection and/or diagnosis of SARS-CoV-2 by FDA under an Emergency Use Authorization (EUA). This EUA will remain  in effect (meaning this test can be used) for the duration of the COVID-19 declaration under Section 564(b)(1) of the Act, 21 U.S.C.section 360bbb-3(b)(1), unless the authorization is terminated  or revoked sooner.       Influenza A by PCR NEGATIVE NEGATIVE Final   Influenza B by PCR NEGATIVE NEGATIVE Final    Comment: (NOTE) The Xpert Xpress SARS-CoV-2/FLU/RSV plus assay is intended as an aid in the diagnosis of influenza from Nasopharyngeal swab specimens and should not be used as a sole basis for treatment. Nasal washings and aspirates are unacceptable for Xpert Xpress SARS-CoV-2/FLU/RSV testing.  Fact Sheet for Patients: BloggerCourse.com  Fact Sheet for Healthcare Providers: SeriousBroker.it  This test is not yet approved or cleared by the [place] FDA and has been authorized for detection and/or diagnosis of SARS-CoV-2 by FDA under an Emergency Use Authorization (EUA). This EUA will remain in effect (meaning this test can be used) for the duration of the COVID-19 declaration under Section 564(b)(1) of the Act, 21 U.S.C. section 360bbb-3(b)(1), unless the authorization is terminated or revoked.  Performed at [facility], [address], [place], [place] [address]      Studies/Results: DG CHEST PORT 1 VIEW  Result Date: 05/26/2021 CLINICAL DATA:  Hypoxia, shortness of breath EXAM: PORTABLE CHEST 1 VIEW COMPARISON:  Previous studies including the examination done earlier today FINDINGS: Transverse diameter of heart is increased. Central pulmonary vessels are more prominent. There is increased interstitial markings in the parahilar regions and lower lung fields. There is poor inspiration. There is no focal consolidation. There is no pleural effusion or pneumothorax. IMPRESSION: Central pulmonary vessels are more prominent, possibly suggesting CHF. Interstitial markings in the parahilar regions and lower lung fields are more prominent suggesting possible pulmonary edema or interstitial pneumonia. Electronically Signed   By: Ernie Avena M.D.   On: 05/26/2021 20:03   DG Chest Port 1 View  Result Date: 05/26/2021 CLINICAL DATA:  31 year old male with history of hypoxemia. Sickle cell crisis. EXAM: PORTABLE CHEST 1 VIEW COMPARISON:  Chest x-ray 04/09/2021. FINDINGS: Right internal jugular single-lumen power porta cath with tip terminating in the distal superior vena cava. Widespread areas of interstitial prominence are again noted throughout the lungs bilaterally, similar in appearance to the prior study. No confluent consolidative airspace disease. No pleural effusions. No pneumothorax. No suspicious appearing pulmonary nodules or masses are noted. No evidence of pulmonary edema. Heart size is normal.  Upper mediastinal contours are within normal limits. IMPRESSION: 1. Chronic rather diffuse interstitial prominence, similar to prior examinations. No definite new acute findings are noted on today's study. 2. New right internal jugular single-lumen power porta cath with tip terminating in the distal superior vena cava. No pneumothorax or other acute complicating features. Electronically Signed   By: Trudie Reed M.D.   On: 05/26/2021 05:20     Medications: Scheduled Meds:  abacavir-dolutegravir-lamiVUDine  1 tablet Oral QHS   apixaban  5 mg Oral BID   Chlorhexidine Gluconate Cloth  6 each Topical Daily   HYDROmorphone   Intravenous Q4H   hydroxyurea  2,000 mg Oral QHS   loratadine  10 mg Oral Daily   multivitamin with minerals  1 tablet Oral Q breakfast   senna-docusate  1 tablet Oral BID   Continuous Infusions:  dextrose 5 % and 0.45% NaCl 75 mL/hr at 05/27/21 0344   PRN Meds:.ALPRAZolam, cyclobenzaprine, diphenhydrAMINE, naloxone **AND** sodium chloride flush, ondansetron (ZOFRAN) IV, polyethylene glycol  Consultants: none  Procedures: none  Antibiotics: none  Assessment/Plan: Principal Problem:   Sickle cell pain crisis (HCC) Active Problems:   Generalized anxiety disorder   Human immunodeficiency virus (HIV) disease (HCC)   History of pulmonary embolus (PE)   Hypoxia   Chronic, continuous use of opioids  Sickle cell disease with pain crisis: Decrease IV fluids to KVO Continue IV Dilaudid PCA without any changes in settings No IV Toradol, contraindicated due to allergy Oxycodone 10 mg every 4 hours as needed for breakthrough pain Monitor vital signs very closely, reevaluate pain scale regularly, and supplemental oxygen as needed  Leukocytosis: Improving.  Patient remains afebrile.  Chest x-ray shows no acute cardiopulmonary process.  COVID-19 and influenza negative.  Follow closely.  Labs in AM.  Anemia of chronic disease: Hemoglobin is stable and consistent with patient's baseline.  There is no clinical indication for blood transfusion at this time.  Monitor closely.  Generalized anxiety: Continue home medications as needed.  No suicidal or homicidal intentions on today.  Follow closely.  Chronic pain syndrome: Continue home medications  History of HIV disease: Continue Triumeq daily  History of pulmonary embolism: Continue Eliquis  Code Status: Full Code Family Communication:  N/A Disposition Plan: Not yet ready for discharge  Landi Biscardi Rennis Petty  APRN, [name], [name] [facility] [address] [place], [place] [address] [phone]  If 7PM-7AM, please contact night-coverage.  05/27/2021, 8:35 AM  LOS: 1 day

## 2021-05-28 DIAGNOSIS — D5700 Hb-SS disease with crisis, unspecified: Secondary | ICD-10-CM | POA: Diagnosis not present

## 2021-05-28 LAB — URINALYSIS, ROUTINE W REFLEX MICROSCOPIC
Bacteria, UA: NONE SEEN
Bilirubin Urine: NEGATIVE
Glucose, UA: NEGATIVE mg/dL
Hgb urine dipstick: NEGATIVE
Ketones, ur: NEGATIVE mg/dL
Nitrite: NEGATIVE
Protein, ur: NEGATIVE mg/dL
Specific Gravity, Urine: 1.009 (ref 1.005–1.030)
pH: 5 (ref 5.0–8.0)

## 2021-05-28 LAB — MRSA NEXT GEN BY PCR, NASAL: MRSA by PCR Next Gen: NOT DETECTED

## 2021-05-28 LAB — CBC
HCT: 21.2 % — ABNORMAL LOW (ref 39.0–52.0)
Hemoglobin: 7.8 g/dL — ABNORMAL LOW (ref 13.0–17.0)
MCH: 36.1 pg — ABNORMAL HIGH (ref 26.0–34.0)
MCHC: 36.8 g/dL — ABNORMAL HIGH (ref 30.0–36.0)
MCV: 98.1 fL (ref 80.0–100.0)
Platelets: 291 10*3/uL (ref 150–400)
RBC: 2.16 MIL/uL — ABNORMAL LOW (ref 4.22–5.81)
RDW: 23.8 % — ABNORMAL HIGH (ref 11.5–15.5)
WBC: 12 10*3/uL — ABNORMAL HIGH (ref 4.0–10.5)
nRBC: 5.3 % — ABNORMAL HIGH (ref 0.0–0.2)

## 2021-05-28 LAB — BASIC METABOLIC PANEL
Anion gap: 6 (ref 5–15)
BUN: 11 mg/dL (ref 6–20)
CO2: 26 mmol/L (ref 22–32)
Calcium: 8.7 mg/dL — ABNORMAL LOW (ref 8.9–10.3)
Chloride: 102 mmol/L (ref 98–111)
Creatinine, Ser: 0.75 mg/dL (ref 0.61–1.24)
GFR, Estimated: >60 mL/min (ref >60)
Glucose, Bld: 101 mg/dL — ABNORMAL HIGH (ref 70–99)
Potassium: 4.1 mmol/L (ref 3.5–5.1)
Sodium: 134 mmol/L — ABNORMAL LOW (ref 135–145)

## 2021-05-28 LAB — STREP PNEUMONIAE URINARY ANTIGEN: Strep Pneumo Urinary Antigen: NEGATIVE

## 2021-05-28 MED ORDER — IPRATROPIUM-ALBUTEROL 0.5-2.5 (3) MG/3ML IN SOLN
3.0000 mL | Freq: Four times a day (QID) | RESPIRATORY_TRACT | Status: DC
  Administered 2021-05-28: 3 mL via RESPIRATORY_TRACT
  Filled 2021-05-28: qty 3

## 2021-05-28 MED ORDER — SODIUM CHLORIDE 0.9 % IV SOLN
2.0000 g | Freq: Three times a day (TID) | INTRAVENOUS | Status: DC
  Administered 2021-05-28 – 2021-05-30 (×7): 2 g via INTRAVENOUS
  Filled 2021-05-28 (×9): qty 2

## 2021-05-28 MED ORDER — SODIUM CHLORIDE 0.9 % IV SOLN: INTRAVENOUS | Status: DC | PRN

## 2021-05-28 MED ORDER — VANCOMYCIN HCL 1250 MG/250ML IV SOLN
1250.0000 mg | Freq: Two times a day (BID) | INTRAVENOUS | Status: DC
  Administered 2021-05-28 (×2): 1250 mg via INTRAVENOUS
  Filled 2021-05-28 (×3): qty 250

## 2021-05-28 MED ORDER — IPRATROPIUM-ALBUTEROL 0.5-2.5 (3) MG/3ML IN SOLN
3.0000 mL | Freq: Two times a day (BID) | RESPIRATORY_TRACT | Status: DC
  Administered 2021-05-29 – 2021-05-30 (×3): 3 mL via RESPIRATORY_TRACT
  Filled 2021-05-28 (×4): qty 3

## 2021-05-28 NOTE — Progress Notes (Addendum)
End of shift  Pt A&Ox4, independent in the room.  PCA pump and prn oxy for pain.    Pt receiving IV ABX

## 2021-05-28 NOTE — Progress Notes (Signed)
°  Transition of Care [facility]) Screening Note   Patient Details  Name: Martin [name] Date of Birth: 1990/08/03   Transition of Care [facility]) CM/SW Contact:    Lanier Clam, RN Phone Number: 05/28/2021, 2:19 PM    Transition of Care Department [facility]) has reviewed patient and no TOC needs have been identified at this time. We will continue to monitor patient advancement through interdisciplinary progression rounds. If new patient transition needs arise, please place a TOC consult.

## 2021-05-28 NOTE — Progress Notes (Addendum)
Subjective: Martin [name] is a 31 year old male with a medical history significant for sickle cell disease, chronic pain syndrome, opiate dependence and tolerance, history of PE on Eliquis, history of HIV, and history of anemia of chronic disease was admitted for sickle cell pain crisis.  Patient has a significant oxygen requirement.  Requiring 5 L/min to maintain oxygen saturation above 90%.  Patient reports shortness of breath with exertion.  CT of chest shows underlying chronic lung disease with mild bronchiectasis and groundglass densities at the bases.  Patchy groundglass densities now visible within the right greater then left upper lobes as a new compared with CT from 2021 and may represent acute airspace disease or possible pneumonia superimposed on underlying chronic lung disease.  Patient continues to have significant allover body pain.  Patient rates his pain as 9/10.  He denies any headache, chest pain, shortness of breath, urinary symptoms, nausea, vomiting, or diarrhea.  Objective:  Vital signs in last 24 hours:  Vitals:   05/28/21 0934 05/28/21 1042 05/28/21 1208 05/28/21 1331  BP:  107/73  105/74  Pulse:  97  94  Resp: 14 15 16 15   Temp:  98.4 F (36.9 C)  99.1 F (37.3 C)  TempSrc:  Oral  Oral  SpO2: 93% 91% 95% 90%  Weight:      Height:        Intake/Output from previous day:   Intake/Output Summary (Last 24 hours) at 05/28/2021 1417 Last data filed at 05/28/2021 1329 Gross per 24 hour  Intake 1389.61 ml  Output 4175 ml  Net -2785.39 ml    Physical Exam: General: Alert, awake, oriented x3, in no acute distress.  HEENT: [place]/AT PEERL, EOMI Neck: Trachea midline,  no masses, no thyromegal,y no JVD, no carotid bruit OROPHARYNX:  Moist, No exudate/ erythema/lesions.  Heart: Regular rate and rhythm, without murmurs, rubs, gallops, PMI non-displaced, no heaves or thrills on palpation.  Lungs: Clear to auscultation, no wheezing or rhonchi noted. No increased vocal  fremitus resonant to percussion  Abdomen: Soft, nontender, nondistended, positive bowel sounds, no masses no hepatosplenomegaly noted..  Neuro: No focal neurological deficits noted cranial nerves II through XII grossly intact. DTRs 2+ bilaterally upper and lower extremities. Strength 5 out of 5 in bilateral upper and lower extremities. Musculoskeletal: No warm swelling or erythema around joints, no spinal tenderness noted. Psychiatric: Patient alert and oriented x3, good insight and cognition, good recent to remote recall. Lymph node survey: No cervical axillary or inguinal lymphadenopathy noted.  Lab Results:  Basic Metabolic Panel:    Component Value Date/Time   NA 134 (L) 05/28/2021 0515   NA 136 03/18/2021 1232   K 4.1 05/28/2021 0515   CL 102 05/28/2021 0515   CO2 26 05/28/2021 0515   BUN 11 05/28/2021 0515   BUN 11 03/18/2021 1232   CREATININE 0.75 05/28/2021 0515   GLUCOSE 101 (H) 05/28/2021 0515   CALCIUM 8.7 (L) 05/28/2021 0515   CBC:    Component Value Date/Time   WBC 12.0 (H) 05/28/2021 0515   HGB 7.8 (L) 05/28/2021 0515   HGB 8.3 (L) 03/18/2021 1232   HCT 21.2 (L) 05/28/2021 0515   HCT 22.8 (L) 03/18/2021 1232   PLT 291 05/28/2021 0515   PLT 361 03/18/2021 1232   MCV 98.1 05/28/2021 0515   MCV 111 (H) 03/18/2021 1232   NEUTROABS 12.5 (H) 05/26/2021 0530   NEUTROABS 6.3 03/18/2021 1232   LYMPHSABS 7.3 (H) 05/26/2021 0530   LYMPHSABS 6.1 (H) 03/18/2021 1232  MONOABS 1.5 (H) 05/26/2021 0530   EOSABS 0.2 05/26/2021 0530   EOSABS 0.2 03/18/2021 1232   BASOSABS 0.1 05/26/2021 0530   BASOSABS 0.1 03/18/2021 1232    Recent Results (from the past 240 hour(s))  Resp Panel by RT-PCR (Flu A&B, Covid) Nasopharyngeal Swab     Status: None   Collection Time: 05/26/21 10:52 AM   Specimen: Nasopharyngeal Swab; Nasopharyngeal(NP) swabs in vial transport medium  Result Value Ref Range Status   SARS Coronavirus 2 by RT PCR NEGATIVE NEGATIVE Final    Comment:  (NOTE) SARS-CoV-2 target nucleic acids are NOT DETECTED.  The SARS-CoV-2 RNA is generally detectable in upper respiratory specimens during the acute phase of infection. The lowest concentration of SARS-CoV-2 viral copies this assay can detect is 138 copies/mL. A negative result does not preclude SARS-Cov-2 infection and should not be used as the sole basis for treatment or other patient management decisions. A negative result may occur with  improper specimen collection/handling, submission of specimen other than nasopharyngeal swab, presence of viral mutation(s) within the areas targeted by this assay, and inadequate number of viral copies(<138 copies/mL). A negative result must be combined with clinical observations, patient history, and epidemiological information. The expected result is Negative.  Fact Sheet for Patients:  BloggerCourse.com  Fact Sheet for Healthcare Providers:  SeriousBroker.it  This test is no t yet approved or cleared by the [place] FDA and  has been authorized for detection and/or diagnosis of SARS-CoV-2 by FDA under an Emergency Use Authorization (EUA). This EUA will remain  in effect (meaning this test can be used) for the duration of the COVID-19 declaration under Section 564(b)(1) of the Act, 21 U.S.C.section 360bbb-3(b)(1), unless the authorization is terminated  or revoked sooner.       Influenza A by PCR NEGATIVE NEGATIVE Final   Influenza B by PCR NEGATIVE NEGATIVE Final    Comment: (NOTE) The Xpert Xpress SARS-CoV-2/FLU/RSV plus assay is intended as an aid in the diagnosis of influenza from Nasopharyngeal swab specimens and should not be used as a sole basis for treatment. Nasal washings and aspirates are unacceptable for Xpert Xpress SARS-CoV-2/FLU/RSV testing.  Fact Sheet for Patients: BloggerCourse.com  Fact Sheet for Healthcare  Providers: SeriousBroker.it  This test is not yet approved or cleared by the [place] FDA and has been authorized for detection and/or diagnosis of SARS-CoV-2 by FDA under an Emergency Use Authorization (EUA). This EUA will remain in effect (meaning this test can be used) for the duration of the COVID-19 declaration under Section 564(b)(1) of the Act, 21 U.S.C. section 360bbb-3(b)(1), unless the authorization is terminated or revoked.  Performed at [facility], [address] [place], [place] [address]     Studies/Results: DG Chest 2 View  Result Date: 05/27/2021 CLINICAL DATA:  Shortness of breath. EXAM: CHEST - 2 VIEW COMPARISON:  05/26/2021, 04/09/2021, 04/07/2021 FINDINGS: Right chest wall porta catheter tip again overlies the central superior vena cava. Cardiac silhouette and mediastinal contours are within normal limits. There is mild bilateral interstitial thickening that appears minimally improved from 05/26/2021 and more similar to 04/09/2021 radiograph however likely slightly increased compared to 04/07/2021. This appears to represent chronic scarring. No pleural effusion or pneumothorax. No acute skeletal abnormality. IMPRESSION: Mild bilateral mid and lower lung interstitial thickening is mildly improved from 05/26/2021 but may be mildly increased from 04/07/2021. This represents an element of chronic scarring, however mild interstitial pulmonary edema is also possible. Electronically Signed   By: Neita Garnet  M.D.   On: 05/27/2021 18:34   CT CHEST WO CONTRAST  Addendum Date: 05/27/2021   ADDENDUM REPORT: 05/27/2021 21:19 ADDENDUM: Atrophied calcified spleen in the left upper quadrant. Electronically Signed   By: Jasmine PangKim  Fujinaga M.D.   On: 05/27/2021 21:19   Result Date: 05/27/2021 CLINICAL DATA:  Chronic dyspnea EXAM: CT CHEST WITHOUT CONTRAST TECHNIQUE: Multidetector CT imaging of the chest was performed following the standard  protocol without IV contrast. RADIATION DOSE REDUCTION: This exam was performed according to the departmental dose-optimization program which includes automated exposure control, adjustment of the mA and/or kV according to patient size and/or use of iterative reconstruction technique. COMPARISON:  Chest x-ray 05/27/2021, CT chest 04/01/2020, 02/10/2019 FINDINGS: Cardiovascular: Limited without intravenous contrast. Right-sided central venous catheter with tip at the cavoatrial region. Normal cardiac size. Trace pericardial effusion. Mediastinum/Nodes: Midline trachea. No thyroid mass. Esophagus within normal limits. Similar appearance of mild mediastinal adenopathy. Lungs/Pleura: Chronic scarring at the left apex. Mild bronchiectasis at the right middle lobe and bilateral lung bases as before with interstitial thickening and mild ground-glass density consistent with chronic lung disease. New ground-glass opacities in the right greater than left upper lobes. No pleural effusion or pneumothorax. Mild mosaicism. Upper Abdomen: No acute abnormality. Musculoskeletal: No acute osseous abnormality. IMPRESSION: 1. Underlying chronic lung disease as demonstrated on previous exams with mild bronchiectasis and chronic ground-glass densities at the bases 2. Patchy ground-glass densities now visible within the right greater than left upper lobes, new as compared with chest CT from 2021 and may represent acute airspace disease or possible pneumonia superimposed on underlying chronic lung disease. Electronically Signed: By: Jasmine PangKim  Fujinaga M.D. On: 05/27/2021 20:55   DG CHEST PORT 1 VIEW  Result Date: 05/26/2021 CLINICAL DATA:  Hypoxia, shortness of breath EXAM: PORTABLE CHEST 1 VIEW COMPARISON:  Previous studies including the examination done earlier today FINDINGS: Transverse diameter of heart is increased. Central pulmonary vessels are more prominent. There is increased interstitial markings in the parahilar regions and lower  lung fields. There is poor inspiration. There is no focal consolidation. There is no pleural effusion or pneumothorax. IMPRESSION: Central pulmonary vessels are more prominent, possibly suggesting CHF. Interstitial markings in the parahilar regions and lower lung fields are more prominent suggesting possible pulmonary edema or interstitial pneumonia. Electronically Signed   By: Ernie AvenaPalani  Rathinasamy M.D.   On: 05/26/2021 20:03    Medications: Scheduled Meds:  abacavir-dolutegravir-lamiVUDine  1 tablet Oral QHS   apixaban  5 mg Oral BID   Chlorhexidine Gluconate Cloth  6 each Topical Daily   HYDROmorphone   Intravenous Q4H   hydroxyurea  2,000 mg Oral QHS   loratadine  10 mg Oral Daily   multivitamin with minerals  1 tablet Oral Q breakfast   senna-docusate  1 tablet Oral BID   Continuous Infusions:  ceFEPime (MAXIPIME) IV     dextrose 5 % and 0.45% NaCl 75 mL/hr at 05/28/21 0434   vancomycin     PRN Meds:.ALPRAZolam, cyclobenzaprine, diphenhydrAMINE, naloxone **AND** sodium chloride flush, ondansetron (ZOFRAN) IV, oxyCODONE, polyethylene glycol  Consultants: none  Procedures: none  Antibiotics: none  Assessment/Plan: Principal Problem:   Sickle cell pain crisis (HCC) Active Problems:   Generalized anxiety disorder   Human immunodeficiency virus (HIV) disease (HCC)   History of pulmonary embolus (PE)   Hypoxia   Chronic, continuous use of opioids  Healthcare associated pneumonia, respiratory failure with hypoxia: Patient warranted 5 L supplemental oxygen to maintain oxygen saturation above 90%.  Initiate empiric antibiotics.  Cefepime and vancomycin per pharmacy consult.  MRSA PCR pending.  Strep pneumo urinary antigens pending.  Urinalysis and urine culture pending. Duo nebulizer every 6 hours Incentive spirometer Supplemental oxygen, will titrate oxygen as patient's oxygenation improves  Sickle cell disease with pain crisis: Decrease IV fluids to KVO Continue IV Dilaudid  PCA without any changes in settings No IV Toradol, contraindicated due to allergy Oxycodone 10 mg every 4 hours as needed for breakthrough pain Monitor vital signs very closely, reevaluate pain scale regularly, and supplemental oxygen as needed  Leukocytosis: Improving.  Patient remains afebrile.  Chest x-ray shows no acute cardiopulmonary process.  COVID-19 and influenza negative.  Follow closely.  Labs in AM.  Anemia of chronic disease: Hemoglobin is stable and consistent with patient's baseline.  There is no clinical indication for blood transfusion at this time.  Monitor closely.  Generalized anxiety: Continue home medications as needed.  No suicidal or homicidal intentions on today.  Follow closely.  Chronic pain syndrome: Continue home medications  History of HIV disease: Continue Triumeq daily  History of pulmonary embolism: Continue Eliquis  Code Status: Full Code Family Communication: N/A Disposition Plan: Not yet ready for discharge  Delante Karapetyan Rennis Petty  APRN, [name], [name] [facility] [address] [place], [place] [address] [phone]  If 5PM-8AM, please contact night-coverage.  05/28/2021, 2:17 PM  LOS: 2 days

## 2021-05-28 NOTE — Progress Notes (Signed)
Pharmacy Antibiotic Note  Martin [name] is a 31 y.o. male admitted on 05/26/2021 with sickle cell crisis. On 2/8, treatment team became concerned for PNA/acute chest. Pharmacy has been consulted for vancomycin and cefepime dosing.  Plan: Vancomycin 1250 mg IV q12 hr (est AUC 459 based on SCr 0.8; Vd 0.72) Measure vancomycin AUC at steady state as indicated SCr q48 while on vanc MRSA PCR ordered; f/u and narrow vanc as appropriate Cefepime 2g IV q8 hr   Height: 6\' 3"  (190.5 cm) Weight: 68 kg (150 lb) IBW/kg (Calculated) : 84.5  Temp (24hrs), Avg:98.4 F (36.9 C), Min:98 F (36.7 C), Max:99.1 F (37.3 C)  Recent Labs  Lab 05/26/21 0530 05/27/21 0435 05/28/21 0515  WBC 21.6* 13.1* 12.0*  CREATININE 0.88 0.69 0.75    Estimated Creatinine Clearance: 128.7 mL/min (by C-G formula based on SCr of 0.75 mg/dL).    Allergies  Allergen Reactions   Ketorolac Tromethamine Swelling and Other (See Comments)    Patient reports facial edema and left arm edema after administration.   Tape Rash and Other (See Comments)    PLEASE DO NOT USE THE CLEAR, THICK, "PLASTIC" TAPE- only paper tape is tolerated    Wound Dressing Adhesive Rash    Antimicrobials this admission: 2/8 vanc >>  2/8 cefepime >>   Dose adjustments this admission: N/a  Microbiology results: 2/8 MRSA PCR: ordered  Thank you for allowing pharmacy to be a part of this patients care.  Mattheus Rauls A 05/28/2021 2:11 PM

## 2021-05-29 DIAGNOSIS — D5700 Hb-SS disease with crisis, unspecified: Secondary | ICD-10-CM | POA: Diagnosis not present

## 2021-05-29 NOTE — Progress Notes (Signed)
Subjective: Martin [name] is a 31 year old male with a medical history significant for sickle cell disease, chronic pain syndrome, opiate dependence and tolerance, history of PE on Eliquis, history of HIV, and history of anemia of chronic disease was admitted for sickle cell pain crisis.  Patient has a significant oxygen requirement.  Requiring 4 L/min to maintain oxygen saturation above 90%.  Shortness of breath has improved overnight.    Patient continues to have significant allover body pain.  Patient rates his pain as 7/10.  He denies any headache, chest pain, shortness of breath, urinary symptoms, nausea, vomiting, or diarrhea.  Objective:  Vital signs in last 24 hours:  Vitals:   05/29/21 0959 05/29/21 1225 05/29/21 1327 05/29/21 1643  BP: 101/67  (!) 99/59   Pulse: 79  93   Resp: 15 15 15 12   Temp: 98 F (36.7 C)  98.2 F (36.8 C)   TempSrc: Oral  Oral   SpO2: 100% 96% (!) 81% 95%  Weight:      Height:        Intake/Output from previous day:   Intake/Output Summary (Last 24 hours) at 05/29/2021 1732 Last data filed at 05/29/2021 1600 Gross per 24 hour  Intake 1010.88 ml  Output 2575 ml  Net -1564.12 ml    Physical Exam: General: Alert, awake, oriented x3, in no acute distress.  HEENT: [place]/AT PEERL, EOMI Neck: Trachea midline,  no masses, no thyromegal,y no JVD, no carotid bruit OROPHARYNX:  Moist, No exudate/ erythema/lesions.  Heart: Regular rate and rhythm, without murmurs, rubs, gallops, PMI non-displaced, no heaves or thrills on palpation.  Lungs: Clear to auscultation, no wheezing or rhonchi noted. No increased vocal fremitus resonant to percussion  Abdomen: Soft, nontender, nondistended, positive bowel sounds, no masses no hepatosplenomegaly noted..  Neuro: No focal neurological deficits noted cranial nerves II through XII grossly intact. DTRs 2+ bilaterally upper and lower extremities. Strength 5 out of 5 in bilateral upper and lower  extremities. Musculoskeletal: No warm swelling or erythema around joints, no spinal tenderness noted. Psychiatric: Patient alert and oriented x3, good insight and cognition, good recent to remote recall. Lymph node survey: No cervical axillary or inguinal lymphadenopathy noted.  Lab Results:  Basic Metabolic Panel:    Component Value Date/Time   NA 134 (L) 05/28/2021 0515   NA 136 03/18/2021 1232   K 4.1 05/28/2021 0515   CL 102 05/28/2021 0515   CO2 26 05/28/2021 0515   BUN 11 05/28/2021 0515   BUN 11 03/18/2021 1232   CREATININE 0.75 05/28/2021 0515   GLUCOSE 101 (H) 05/28/2021 0515   CALCIUM 8.7 (L) 05/28/2021 0515   CBC:    Component Value Date/Time   WBC 12.0 (H) 05/28/2021 0515   HGB 7.8 (L) 05/28/2021 0515   HGB 8.3 (L) 03/18/2021 1232   HCT 21.2 (L) 05/28/2021 0515   HCT 22.8 (L) 03/18/2021 1232   PLT 291 05/28/2021 0515   PLT 361 03/18/2021 1232   MCV 98.1 05/28/2021 0515   MCV 111 (H) 03/18/2021 1232   NEUTROABS 12.5 (H) 05/26/2021 0530   NEUTROABS 6.3 03/18/2021 1232   LYMPHSABS 7.3 (H) 05/26/2021 0530   LYMPHSABS 6.1 (H) 03/18/2021 1232   MONOABS 1.5 (H) 05/26/2021 0530   EOSABS 0.2 05/26/2021 0530   EOSABS 0.2 03/18/2021 1232   BASOSABS 0.1 05/26/2021 0530   BASOSABS 0.1 03/18/2021 1232    Recent Results (from the past 240 hour(s))  Resp Panel by RT-PCR (Flu A&B, Covid) Nasopharyngeal Swab  Status: None   Collection Time: 05/26/21 10:52 AM   Specimen: Nasopharyngeal Swab; Nasopharyngeal(NP) swabs in vial transport medium  Result Value Ref Range Status   SARS Coronavirus 2 by RT PCR NEGATIVE NEGATIVE Final    Comment: (NOTE) SARS-CoV-2 target nucleic acids are NOT DETECTED.  The SARS-CoV-2 RNA is generally detectable in upper respiratory specimens during the acute phase of infection. The lowest concentration of SARS-CoV-2 viral copies this assay can detect is 138 copies/mL. A negative result does not preclude SARS-Cov-2 infection and should not  be used as the sole basis for treatment or other patient management decisions. A negative result may occur with  improper specimen collection/handling, submission of specimen other than nasopharyngeal swab, presence of viral mutation(s) within the areas targeted by this assay, and inadequate number of viral copies(<138 copies/mL). A negative result must be combined with clinical observations, patient history, and epidemiological information. The expected result is Negative.  Fact Sheet for Patients:  BloggerCourse.comhttps://www.fda.gov/media/152166/download  Fact Sheet for Healthcare Providers:  SeriousBroker.ithttps://www.fda.gov/media/152162/download  This test is no t yet approved or cleared by the [place] FDA and  has been authorized for detection and/or diagnosis of SARS-CoV-2 by FDA under an Emergency Use Authorization (EUA). This EUA will remain  in effect (meaning this test can be used) for the duration of the COVID-19 declaration under Section 564(b)(1) of the Act, 21 U.S.C.section 360bbb-3(b)(1), unless the authorization is terminated  or revoked sooner.       Influenza A by PCR NEGATIVE NEGATIVE Final   Influenza B by PCR NEGATIVE NEGATIVE Final    Comment: (NOTE) The Xpert Xpress SARS-CoV-2/FLU/RSV plus assay is intended as an aid in the diagnosis of influenza from Nasopharyngeal swab specimens and should not be used as a sole basis for treatment. Nasal washings and aspirates are unacceptable for Xpert Xpress SARS-CoV-2/FLU/RSV testing.  Fact Sheet for Patients: BloggerCourse.comhttps://www.fda.gov/media/152166/download  Fact Sheet for Healthcare Providers: SeriousBroker.ithttps://www.fda.gov/media/152162/download  This test is not yet approved or cleared by the [place] FDA and has been authorized for detection and/or diagnosis of SARS-CoV-2 by FDA under an Emergency Use Authorization (EUA). This EUA will remain in effect (meaning this test can be used) for the duration of the COVID-19 declaration under Section  564(b)(1) of the Act, 21 U.S.C. section 360bbb-3(b)(1), unless the authorization is terminated or revoked.  Performed at [facility], [address] [place], [place] [address]   MRSA Next Gen by PCR, Nasal     Status: None   Collection Time: 05/28/21  5:12 PM   Specimen: Nasal Mucosa; Nasal Swab  Result Value Ref Range Status   MRSA by PCR Next Gen NOT DETECTED NOT DETECTED Final    Comment: (NOTE) The GeneXpert MRSA Assay (FDA approved for NASAL specimens only), is one component of a comprehensive MRSA colonization surveillance program. It is not intended to diagnose MRSA infection nor to guide or monitor treatment for MRSA infections. Test performance is not FDA approved in patients less than 595 years old. Performed at [facility], [address] [place], [place] [address]     Studies/Results: DG Chest 2 View  Result Date: 05/27/2021 CLINICAL DATA:  Shortness of breath. EXAM: CHEST - 2 VIEW COMPARISON:  05/26/2021, 04/09/2021, 04/07/2021 FINDINGS: Right chest wall porta catheter tip again overlies the central superior vena cava. Cardiac silhouette and mediastinal contours are within normal limits. There is mild bilateral interstitial thickening that appears minimally improved from 05/26/2021 and more similar to 04/09/2021 radiograph however likely slightly increased  compared to 04/07/2021. This appears to represent chronic scarring. No pleural effusion or pneumothorax. No acute skeletal abnormality. IMPRESSION: Mild bilateral mid and lower lung interstitial thickening is mildly improved from 05/26/2021 but may be mildly increased from 04/07/2021. This represents an element of chronic scarring, however mild interstitial pulmonary edema is also possible. Electronically Signed   By: Neita Garnet M.D.   On: 05/27/2021 18:34   CT CHEST WO CONTRAST  Addendum Date: 05/27/2021   ADDENDUM REPORT: 05/27/2021 21:19 ADDENDUM: Atrophied calcified spleen in  the left upper quadrant. Electronically Signed   By: Jasmine Pang M.D.   On: 05/27/2021 21:19   Result Date: 05/27/2021 CLINICAL DATA:  Chronic dyspnea EXAM: CT CHEST WITHOUT CONTRAST TECHNIQUE: Multidetector CT imaging of the chest was performed following the standard protocol without IV contrast. RADIATION DOSE REDUCTION: This exam was performed according to the departmental dose-optimization program which includes automated exposure control, adjustment of the mA and/or kV according to patient size and/or use of iterative reconstruction technique. COMPARISON:  Chest x-ray 05/27/2021, CT chest 04/01/2020, 02/10/2019 FINDINGS: Cardiovascular: Limited without intravenous contrast. Right-sided central venous catheter with tip at the cavoatrial region. Normal cardiac size. Trace pericardial effusion. Mediastinum/Nodes: Midline trachea. No thyroid mass. Esophagus within normal limits. Similar appearance of mild mediastinal adenopathy. Lungs/Pleura: Chronic scarring at the left apex. Mild bronchiectasis at the right middle lobe and bilateral lung bases as before with interstitial thickening and mild ground-glass density consistent with chronic lung disease. New ground-glass opacities in the right greater than left upper lobes. No pleural effusion or pneumothorax. Mild mosaicism. Upper Abdomen: No acute abnormality. Musculoskeletal: No acute osseous abnormality. IMPRESSION: 1. Underlying chronic lung disease as demonstrated on previous exams with mild bronchiectasis and chronic ground-glass densities at the bases 2. Patchy ground-glass densities now visible within the right greater than left upper lobes, new as compared with chest CT from 2021 and may represent acute airspace disease or possible pneumonia superimposed on underlying chronic lung disease. Electronically Signed: By: Jasmine Pang M.D. On: 05/27/2021 20:55    Medications: Scheduled Meds:  abacavir-dolutegravir-lamiVUDine  1 tablet Oral QHS   apixaban   5 mg Oral BID   Chlorhexidine Gluconate Cloth  6 each Topical Daily   HYDROmorphone   Intravenous Q4H   hydroxyurea  2,000 mg Oral QHS   ipratropium-albuterol  3 mL Nebulization BID   loratadine  10 mg Oral Daily   multivitamin with minerals  1 tablet Oral Q breakfast   senna-docusate  1 tablet Oral BID   Continuous Infusions:  sodium chloride 10 mL/hr at 05/28/21 1510   ceFEPime (MAXIPIME) IV Stopped (05/29/21 1427)   dextrose 5 % and 0.45% NaCl 10 mL/hr at 05/29/21 1600   PRN Meds:.sodium chloride, ALPRAZolam, cyclobenzaprine, diphenhydrAMINE, naloxone **AND** sodium chloride flush, ondansetron (ZOFRAN) IV, oxyCODONE, polyethylene glycol  Consultants: none  Procedures: none  Antibiotics: IV cefepime  Assessment/Plan: Principal Problem:   Sickle cell pain crisis (HCC) Active Problems:   Generalized anxiety disorder   Human immunodeficiency virus (HIV) disease (HCC)   History of pulmonary embolus (PE)   Hypoxia   Chronic, continuous use of opioids  Healthcare associated pneumonia, respiratory failure with hypoxia: Patient weaned to 4 L supplemental oxygen overnight.  We will continue IV antibiotics.  MRSA per PCR negative, antibiotics de-escalated.  Duo nebulizer every 6 hours Incentive spirometer Supplemental oxygen, will titrate oxygen as patient's oxygenation improves  Sickle cell disease with pain crisis: Continue IV Dilaudid PCA without any changes in settings No IV Toradol,  contraindicated due to allergy Oxycodone 10 mg every 4 hours as needed for breakthrough pain Monitor vital signs very closely, reevaluate pain scale regularly, and supplemental oxygen as needed  Leukocytosis: Improving.  Patient remains afebrile.  Chest x-ray shows no acute cardiopulmonary process.  COVID-19 and influenza negative.  Follow closely.  Labs in AM.  Anemia of chronic disease: Hemoglobin is stable and consistent with patient's baseline.  There is no clinical indication for blood  transfusion at this time.  Monitor closely.  Generalized anxiety: Continue home medications as needed.  No suicidal or homicidal intentions on today.  Follow closely.  Chronic pain syndrome: Continue home medications  History of HIV disease: Continue Triumeq daily  History of pulmonary embolism: Continue Eliquis  Code Status: Full Code Family Communication: N/A Disposition Plan: Not yet ready for discharge  Ester Mabe Rennis Petty  APRN, [name], [name] [facility] [address] [place], [place] [address] [phone]  If 5PM-8AM, please contact night-coverage.  05/29/2021, 5:32 PM  LOS: 3 days

## 2021-05-30 DIAGNOSIS — D5700 Hb-SS disease with crisis, unspecified: Secondary | ICD-10-CM | POA: Diagnosis not present

## 2021-05-30 LAB — COMPREHENSIVE METABOLIC PANEL
ALT: 13 U/L (ref 0–44)
AST: 27 U/L (ref 15–41)
Albumin: 3.5 g/dL (ref 3.5–5.0)
Alkaline Phosphatase: 52 U/L (ref 38–126)
Anion gap: 4 — ABNORMAL LOW (ref 5–15)
BUN: 13 mg/dL (ref 6–20)
CO2: 27 mmol/L (ref 22–32)
Calcium: 8.8 mg/dL — ABNORMAL LOW (ref 8.9–10.3)
Chloride: 104 mmol/L (ref 98–111)
Creatinine, Ser: 0.77 mg/dL (ref 0.61–1.24)
GFR, Estimated: >60 mL/min (ref >60)
Glucose, Bld: 95 mg/dL (ref 70–99)
Potassium: 4.2 mmol/L (ref 3.5–5.1)
Sodium: 135 mmol/L (ref 135–145)
Total Bilirubin: 3.9 mg/dL — ABNORMAL HIGH (ref 0.3–1.2)
Total Protein: 8.3 g/dL — ABNORMAL HIGH (ref 6.5–8.1)

## 2021-05-30 LAB — CBC
HCT: 19.2 % — ABNORMAL LOW (ref 39.0–52.0)
Hemoglobin: 7.1 g/dL — ABNORMAL LOW (ref 13.0–17.0)
MCH: 35.5 pg — ABNORMAL HIGH (ref 26.0–34.0)
MCHC: 37 g/dL — ABNORMAL HIGH (ref 30.0–36.0)
MCV: 96 fL (ref 80.0–100.0)
Platelets: 357 10*3/uL (ref 150–400)
RBC: 2 MIL/uL — ABNORMAL LOW (ref 4.22–5.81)
RDW: 23.7 % — ABNORMAL HIGH (ref 11.5–15.5)
WBC: 10.7 10*3/uL — ABNORMAL HIGH (ref 4.0–10.5)
nRBC: 2.3 % — ABNORMAL HIGH (ref 0.0–0.2)

## 2021-05-30 LAB — CREATININE, SERUM
Creatinine, Ser: 0.71 mg/dL (ref 0.61–1.24)
GFR, Estimated: >60 mL/min (ref >60)

## 2021-05-30 NOTE — Progress Notes (Addendum)
Pt spoke with RN and wants to leave ALA. He reported family problem that came out today and wants to g home AMA. Pt has no O2 in his car and no one can bring him his O2, pt lives in [place] [address] away. Dr. Hyman Hopes and Dr. Hart Rochester were both notified, On-Call Hospitalist was notified and came in to speak with pt. Discussion on the risk of leaving AMA was done, pt still insist of going home. Case manager was called but no answer. Pt signed AMA paper, kept in the chart. Port was deaccessed. Pt wants to leave AMA after eating his dinner, will report to night RN.

## 2021-05-30 NOTE — Progress Notes (Signed)
°   05/30/21 0027  Vitals  Temp 99.2 F (37.3 C)  Temp Source Oral  BP 106/70  MAP (mmHg) 81  BP Location Left Arm  BP Method Automatic  Patient Position (if appropriate) Sitting  Pulse Rate (!) 114  Pulse Rate Source Monitor  Resp 18  MEWS COLOR  MEWS Score Color Yellow  Oxygen Therapy  SpO2 (!) 88 %  O2 Device Nasal Cannula  O2 Flow Rate (L/min) 5 L/min  Pain Assessment  Pain Scale 0-10  Pain Score 9  Pain Type Chronic pain  Pain Location Generalized  Pain Descriptors / Indicators Aching;Discomfort  Pain Frequency Constant  Pain Intervention(s) MD notified (Comment);RN made aware;Heat applied;Pain pump;PCA encouraged  MEWS Score  MEWS Temp 0  MEWS Systolic 0  MEWS Pulse 2  MEWS RR 0  MEWS LOC 0  MEWS Score 2  Provider Notification  Provider Name/Title Linton Flemings  Date Provider Notified 05/30/21  Time Provider Notified 0024  Notification Type Page  Notification Reason Requested by patient/family (pain)  Provider response No new orders  Date of Provider Response 05/30/21  Time of Provider Response [phone]

## 2021-05-30 NOTE — Progress Notes (Signed)
Subjective: Martin [name] is a 31 year old male with a medical history significant for sickle cell disease, chronic pain syndrome, opiate dependence and tolerance, history of PE on Eliquis, history of HIV, and history of anemia of chronic disease was admitted for sickle cell pain crisis.  Patient has a significant oxygen requirement.  Requiring 4 L/min to maintain oxygen saturation above 90%.  Shortness of breath has improved overnight.    Patient continues to have significant allover body pain.  Patient rates his pain as 6/10.  He denies any headache, chest pain, shortness of breath, urinary symptoms, nausea, vomiting, or diarrhea.  Objective:  Vital signs in last 24 hours:  Vitals:   05/30/21 0927 05/30/21 1145 05/30/21 1455 05/30/21 1534  BP: (!) 92/53  100/71   Pulse: 86  91   Resp: 16 17 16 18   Temp: 98.4 F (36.9 C)     TempSrc: Oral     SpO2: 96% 98% 92% 91%  Weight:      Height:        Intake/Output from previous day:   Intake/Output Summary (Last 24 hours) at 05/30/2021 1620 Last data filed at 05/30/2021 1455 Gross per 24 hour  Intake 314.56 ml  Output 2850 ml  Net -2535.44 ml    Physical Exam: General: Alert, awake, oriented x3, in no acute distress.  HEENT: [place]/AT PEERL, EOMI Neck: Trachea midline,  no masses, no thyromegal,y no JVD, no carotid bruit OROPHARYNX:  Moist, No exudate/ erythema/lesions.  Heart: Regular rate and rhythm, without murmurs, rubs, gallops, PMI non-displaced, no heaves or thrills on palpation.  Lungs: Clear to auscultation, no wheezing or rhonchi noted. No increased vocal fremitus resonant to percussion  Abdomen: Soft, nontender, nondistended, positive bowel sounds, no masses no hepatosplenomegaly noted..  Neuro: No focal neurological deficits noted cranial nerves II through XII grossly intact. DTRs 2+ bilaterally upper and lower extremities. Strength 5 out of 5 in bilateral upper and lower extremities. Musculoskeletal: No warm swelling  or erythema around joints, no spinal tenderness noted. Psychiatric: Patient alert and oriented x3, good insight and cognition, good recent to remote recall. Lymph node survey: No cervical axillary or inguinal lymphadenopathy noted.  Lab Results:  Basic Metabolic Panel:    Component Value Date/Time   NA 135 05/30/2021 1225   NA 136 03/18/2021 1232   K 4.2 05/30/2021 1225   CL 104 05/30/2021 1225   CO2 27 05/30/2021 1225   BUN 13 05/30/2021 1225   BUN 11 03/18/2021 1232   CREATININE 0.77 05/30/2021 1225   GLUCOSE 95 05/30/2021 1225   CALCIUM 8.8 (L) 05/30/2021 1225   CBC:    Component Value Date/Time   WBC 10.7 (H) 05/30/2021 1225   HGB 7.1 (L) 05/30/2021 1225   HGB 8.3 (L) 03/18/2021 1232   HCT 19.2 (L) 05/30/2021 1225   HCT 22.8 (L) 03/18/2021 1232   PLT 357 05/30/2021 1225   PLT 361 03/18/2021 1232   MCV 96.0 05/30/2021 1225   MCV 111 (H) 03/18/2021 1232   NEUTROABS 12.5 (H) 05/26/2021 0530   NEUTROABS 6.3 03/18/2021 1232   LYMPHSABS 7.3 (H) 05/26/2021 0530   LYMPHSABS 6.1 (H) 03/18/2021 1232   MONOABS 1.5 (H) 05/26/2021 0530   EOSABS 0.2 05/26/2021 0530   EOSABS 0.2 03/18/2021 1232   BASOSABS 0.1 05/26/2021 0530   BASOSABS 0.1 03/18/2021 1232    Recent Results (from the past 240 hour(s))  Resp Panel by RT-PCR (Flu A&B, Covid) Nasopharyngeal Swab     Status: None  Collection Time: 05/26/21 10:52 AM   Specimen: Nasopharyngeal Swab; Nasopharyngeal(NP) swabs in vial transport medium  Result Value Ref Range Status   SARS Coronavirus 2 by RT PCR NEGATIVE NEGATIVE Final    Comment: (NOTE) SARS-CoV-2 target nucleic acids are NOT DETECTED.  The SARS-CoV-2 RNA is generally detectable in upper respiratory specimens during the acute phase of infection. The lowest concentration of SARS-CoV-2 viral copies this assay can detect is 138 copies/mL. A negative result does not preclude SARS-Cov-2 infection and should not be used as the sole basis for treatment or other patient  management decisions. A negative result may occur with  improper specimen collection/handling, submission of specimen other than nasopharyngeal swab, presence of viral mutation(s) within the areas targeted by this assay, and inadequate number of viral copies(<138 copies/mL). A negative result must be combined with clinical observations, patient history, and epidemiological information. The expected result is Negative.  Fact Sheet for Patients:  BloggerCourse.com  Fact Sheet for Healthcare Providers:  SeriousBroker.it  This test is no t yet approved or cleared by the [place] FDA and  has been authorized for detection and/or diagnosis of SARS-CoV-2 by FDA under an Emergency Use Authorization (EUA). This EUA will remain  in effect (meaning this test can be used) for the duration of the COVID-19 declaration under Section 564(b)(1) of the Act, 21 U.S.C.section 360bbb-3(b)(1), unless the authorization is terminated  or revoked sooner.       Influenza A by PCR NEGATIVE NEGATIVE Final   Influenza B by PCR NEGATIVE NEGATIVE Final    Comment: (NOTE) The Xpert Xpress SARS-CoV-2/FLU/RSV plus assay is intended as an aid in the diagnosis of influenza from Nasopharyngeal swab specimens and should not be used as a sole basis for treatment. Nasal washings and aspirates are unacceptable for Xpert Xpress SARS-CoV-2/FLU/RSV testing.  Fact Sheet for Patients: BloggerCourse.com  Fact Sheet for Healthcare Providers: SeriousBroker.it  This test is not yet approved or cleared by the [place] FDA and has been authorized for detection and/or diagnosis of SARS-CoV-2 by FDA under an Emergency Use Authorization (EUA). This EUA will remain in effect (meaning this test can be used) for the duration of the COVID-19 declaration under Section 564(b)(1) of the Act, 21 U.S.C. section 360bbb-3(b)(1),  unless the authorization is terminated or revoked.  Performed at [facility], [address], [place], [place] [address]   MRSA Next Gen by PCR, Nasal     Status: None   Collection Time: 05/28/21  5:12 PM   Specimen: Nasal Mucosa; Nasal Swab  Result Value Ref Range Status   MRSA by PCR Next Gen NOT DETECTED NOT DETECTED Final    Comment: (NOTE) The GeneXpert MRSA Assay (FDA approved for NASAL specimens only), is one component of a comprehensive MRSA colonization surveillance program. It is not intended to diagnose MRSA infection nor to guide or monitor treatment for MRSA infections. Test performance is not FDA approved in patients less than 73 years old. Performed at [facility], [address] [place], [place] [address]     Studies/Results: No results found.  Medications: Scheduled Meds:  abacavir-dolutegravir-lamiVUDine  1 tablet Oral QHS   apixaban  5 mg Oral BID   Chlorhexidine Gluconate Cloth  6 each Topical Daily   HYDROmorphone   Intravenous Q4H   hydroxyurea  2,000 mg Oral QHS   ipratropium-albuterol  3 mL Nebulization BID   loratadine  10 mg Oral Daily   multivitamin with minerals  1 tablet Oral Q  breakfast   senna-docusate  1 tablet Oral BID   Continuous Infusions:  sodium chloride 10 mL/hr at 05/28/21 1510   ceFEPime (MAXIPIME) IV 2 g (05/30/21 1332)   dextrose 5 % and 0.45% NaCl 10 mL/hr at 05/30/21 0305   PRN Meds:.sodium chloride, ALPRAZolam, cyclobenzaprine, diphenhydrAMINE, naloxone **AND** sodium chloride flush, ondansetron (ZOFRAN) IV, oxyCODONE, polyethylene glycol  Consultants: none  Procedures: none  Antibiotics: IV cefepime  Assessment/Plan: Principal Problem:   Sickle cell pain crisis (HCC) Active Problems:   Generalized anxiety disorder   Human immunodeficiency virus (HIV) disease (HCC)   History of pulmonary embolus (PE)   Hypoxia   Chronic, continuous use of opioids  Healthcare associated  pneumonia, respiratory failure with hypoxia: Patient continued 4 L supplemental oxygen overnight.  We will continue IV antibiotics.  Incentive spirometer Supplemental oxygen, will titrate oxygen as patient's oxygenation improves  Sickle cell disease with pain crisis: Continue IV Dilaudid PCA without any changes in settings No IV Toradol, contraindicated due to allergy Oxycodone 10 mg every 4 hours as needed for breakthrough pain Monitor vital signs very closely, reevaluate pain scale regularly, and supplemental oxygen as needed  Leukocytosis: Improving.  Patient remains afebrile.  Chest x-ray shows no acute cardiopulmonary process.  COVID-19 and influenza negative.  Follow closely.  Labs in AM.  Anemia of chronic disease: Hemoglobin is stable and consistent with patient's baseline.  There is no clinical indication for blood transfusion at this time.  Monitor closely.  Generalized anxiety: Continue home medications as needed.  No suicidal or homicidal intentions on today.  Follow closely.  Chronic pain syndrome: Continue home medications  History of HIV disease: Continue Triumeq daily  History of pulmonary embolism: Continue Eliquis  Code Status: Full Code Family Communication: N/A Disposition Plan: Not yet ready for discharge  Drucella Karbowski Rennis Petty  APRN, [name], [name] [facility] [address] [place], [place] [address] [phone]  If 5PM-8AM, please contact night-coverage.  05/30/2021, 4:20 PM  LOS: 4 days

## 2021-05-30 NOTE — Progress Notes (Signed)
Pharmacy Antibiotic Note  Martin [name] is a 31 y.o. male admitted on 05/26/2021 with sickle cell crisis. On 2/8, treatment team became concerned for PNA/acute chest. Pharmacy was initially consulted to dose vancomycin and cefepime dosing. Vancomycin has since been discontinued as MRSA PCR negative.   Today, 05/30/21 SCr = 0.71 remains stable, WNL. CrCl > 100 mL/min This is day #3 of IV antibiotics Afebrile  Plan: Continue cefepime 2 g IV q8h  Since renal function has remained stable and WNL, no dose adjustment necessary for cefepime. Pharmacy to sign off. Please re-consult if needed.   Height: 6\' 3"  (190.5 cm) Weight: 68 kg (150 lb) IBW/kg (Calculated) : 84.5  Temp (24hrs), Avg:98.4 F (36.9 C), Min:98.1 F (36.7 C), Max:99.2 F (37.3 C)  Recent Labs  Lab 05/26/21 0530 05/27/21 0435 05/28/21 0515 05/30/21 0549  WBC 21.6* 13.1* 12.0*  --   CREATININE 0.88 0.69 0.75 0.71     Estimated Creatinine Clearance: 128.7 mL/min (by C-G formula based on SCr of 0.71 mg/dL).    Allergies  Allergen Reactions   Ketorolac Tromethamine Swelling and Other (See Comments)    Patient reports facial edema and left arm edema after administration.   Tape Rash and Other (See Comments)    PLEASE DO NOT USE THE CLEAR, THICK, "PLASTIC" TAPE- only paper tape is tolerated    Wound Dressing Adhesive Rash    Antimicrobials this admission: 2/8 vanc >> 2/9 2/8 cefepime >>   Dose adjustments this admission: N/a  Microbiology results: 2/8 MRSA PCR: Not detected  4/8, PharmD 05/30/2021 10:34 AM

## 2021-06-02 ENCOUNTER — Telehealth: Payer: Self-pay

## 2021-06-02 NOTE — Telephone Encounter (Signed)
Oxycodone  °

## 2021-06-02 NOTE — Discharge Summary (Signed)
Physician Discharge Summary  DOC MANDALA WYO:[id number] DOB: March 01, 1991 DOA: 05/26/2021  PCP: [name], [name]  Admit date: 05/26/2021  Discharge date:05/30/2021  Discharge Diagnoses:  Principal Problem:   Sickle cell pain crisis ([place]) Active Problems:   Generalized anxiety disorder   Human immunodeficiency virus (HIV) disease ([place])   History of pulmonary embolus (PE)   Hypoxia   Chronic, continuous use of opioids   Discharge Condition: Stable  Disposition:  Left AGAINST MEDICAL ADVICE  Diet: Regular Wt Readings from Last 3 Encounters:  05/26/21 68 kg  04/07/21 68 kg  03/18/21 63.3 kg    History of present illness:  Martin [name]  is a 31 y.o. male with a medical history significant for sickle cell disease type SS, chronic pain syndrome, opiate dependence and tolerance, history of HIV, history of PE on Eliquis, generalized anxiety disorder, and anemia of chronic disease presented to the emergency department with complaints of allover body pain that is consistent with his typical pain crisis.  He attributes pain crisis to changes in weather.  He states that pain intensity has been escalating over the past 4 days and unrelieved by his home medications.  He has been taking Percocet 10 mg every 4 hours as needed without sustained relief.  He currently rates his pain as 10/10, constant, and aching.  He denies any headache, fever, chills, chest pain, or shortness of breath.  No urinary symptoms, nausea, vomiting, or diarrhea.  No sick contacts, recent travel, or known exposure to COVID-19.  ER course: Vital signs show:BP 99/63 (BP Location: Left Arm)    Pulse 90    Temp 98.2 F (36.8 C) (Oral)    Resp 14    Wt 68 kg    SpO2 90%    BMI 18.75 kg/m    Complete blood count shows WBC is elevated at 21.6, hemoglobin 7.9 g/dL, and platelets 309,000.  Complete metabolic panel shows total bilirubin 4.6, otherwise unremarkable.  Absolute reticulocyte, 548.5.  Chest x-ray shows  chronic rather diffuse interstitial prominence, similar to prior examinations.  No definite acute findings are noted on today's study. Right internal jugular single-lumen PowerPort with tip terminating in the distal superior vena cava.  No pneumothorax or other complicating features.  Patient's pain persists despite IV Dilaudid, and IV fluids.  Patient thereby admitted to [facility] for further management of sickle cell pain crisis.  Hospital Course:  Sickle cell disease with pain crisis: Patient was admitted for sickle cell pain crisis and managed appropriately with IVF, IV Dilaudid via PCA and IV Toradol, as well as other adjunct therapies per sickle cell pain management protocols.  Pain intensity slow to respond to treatment medication regimen.  Patient's pain was uncontrolled prior to leaving [place].  Hospital associated pneumonia versus acute chest syndrome: CT on 05/27/2021 showed underlying chronic lung disease that is consistent with previous with mild bronchiectasis and chronic groundglass densities at the bases.  Also, patchy groundglass densities with visible right greater than left upper lungs, new as compared with chest CT from 2021 and may represent acute airspace disease or possible pneumonia superimposed on underlying chronic lung.disease.  Patient had an oxygen requirement of 5 L. IV antibiotics were initiated.  Patient was requiring 4 L of supplemental oxygen prior to leaving [place].   Discharge Exam: Vitals:   05/30/21 1455 05/30/21 1534  BP: 100/71   Pulse: 91   Resp: 16 18  Temp:    SpO2: 92% 91%   Vitals:  05/30/21 0927 05/30/21 1145 05/30/21 1455 05/30/21 1534  BP: (!) 92/53  100/71   Pulse: 86  91   Resp: _0 Temp: 98.4 F (36.9 C)     TempSrc: Oral     SpO2: 96% 98% 92% 91%  Weight:      Height:        Discharge Instructions   Allergies as of 05/30/2021       Reactions   Ketorolac Tromethamine Swelling, Other (See  Comments)   Patient reports facial edema and left arm edema after administration.   Tape Rash, Other (See Comments)   PLEASE DO NOT USE THE CLEAR, THICK, "PLASTIC" TAPE- only paper tape is tolerated    Wound Dressing Adhesive Rash        Medication List     ASK your doctor about these medications    acetaminophen 325 MG tablet Commonly known as: TYLENOL Take 650 mg by mouth every 6 (six) hours as needed for pain or headache.   ALPRAZolam 1 MG tablet Commonly known as: XANAX Take 1 mg by mouth 3 (three) times daily as needed for anxiety.   apixaban 5 MG Tabs tablet Commonly known as: ELIQUIS Take 5 mg by mouth 2 (two) times daily.   cetirizine 10 MG tablet Commonly known as: ZyrTEC Allergy Take 1 tablet (10 mg total) by mouth daily as needed for allergies.   cyclobenzaprine 10 MG tablet Commonly known as: FLEXERIL Take 10 mg by mouth 3 (three) times daily as needed for muscle spasms.   doxycycline 100 MG tablet Commonly known as: VIBRA-TABS Take 100 mg by mouth 2 (two) times daily.   hydroxyurea 500 MG capsule Commonly known as: HYDREA Take 4 capsules (2,000 mg total) by mouth at bedtime.   ibuprofen 600 MG tablet Commonly known as: ADVIL Take 600 mg by mouth 3 (three) times daily as needed for pain.   melatonin 3 MG Tabs tablet Take 6 mg by mouth daily as needed for sleep.   multivitamin with minerals Tabs tablet Take 1 tablet by mouth daily with breakfast.   naloxone 4 MG/0.1ML Liqd nasal spray kit Commonly known as: NARCAN Place 4 mg into the nose as needed for opioid reversal.   oxyCODONE-acetaminophen 10-325 MG tablet Commonly known as: Percocet Take 1 tablet by mouth every 4 (four) hours as needed for pain.   promethazine 12.5 MG tablet Commonly known as: PHENERGAN Take 12.5 mg by mouth every 8 (eight) hours as needed for nausea/vomiting.   Senexon-S 8.6-50 MG tablet Generic drug: senna-docusate Take 1 tablet by mouth daily as needed for  constipation.   Triumeq 600-50-300 MG tablet Generic drug: abacavir-dolutegravir-lamiVUDine Take 1 tablet by mouth at bedtime.   Vitamin D (Ergocalciferol) 1.25 MG (50000 UNIT) Caps capsule Commonly known as: DRISDOL Take 1 capsule (50,000 Units total) by mouth every 7 (seven) days.        The results of significant diagnostics from this hospitalization (including imaging, microbiology, ancillary and laboratory) are listed below for reference.    Significant Diagnostic Studies: DG Chest 2 View  Result Date: 05/27/2021 CLINICAL DATA:  Shortness of breath. EXAM: CHEST - 2 VIEW COMPARISON:  05/26/2021, 04/09/2021, 04/07/2021 FINDINGS: Right chest wall porta catheter tip again overlies the central superior vena cava. Cardiac silhouette and mediastinal contours are within normal limits. There is mild bilateral interstitial thickening that appears minimally improved from 05/26/2021 and more similar to 04/09/2021 radiograph however likely slightly increased compared to 04/07/2021. This appears to represent  chronic scarring. No pleural effusion or pneumothorax. No acute skeletal abnormality. IMPRESSION: Mild bilateral mid and lower lung interstitial thickening is mildly improved from 05/26/2021 but may be mildly increased from 04/07/2021. This represents an element of chronic scarring, however mild interstitial pulmonary edema is also possible. Electronically Signed   By: Yvonne Kendall M.D.   On: 05/27/2021 18:34   CT CHEST WO CONTRAST  Addendum Date: 05/27/2021   ADDENDUM REPORT: 05/27/2021 21:19 ADDENDUM: Atrophied calcified spleen in the left upper quadrant. Electronically Signed   By: Donavan Foil M.D.   On: 05/27/2021 21:19   Result Date: 05/27/2021 CLINICAL DATA:  Chronic dyspnea EXAM: CT CHEST WITHOUT CONTRAST TECHNIQUE: Multidetector CT imaging of the chest was performed following the standard protocol without IV contrast. RADIATION DOSE REDUCTION: This exam was performed according to the  departmental dose-optimization program which includes automated exposure control, adjustment of the mA and/or kV according to patient size and/or use of iterative reconstruction technique. COMPARISON:  Chest x-ray 05/27/2021, CT chest 04/01/2020, 02/10/2019 FINDINGS: Cardiovascular: Limited without intravenous contrast. Right-sided central venous catheter with tip at the cavoatrial region. Normal cardiac size. Trace pericardial effusion. Mediastinum/Nodes: Midline trachea. No thyroid mass. Esophagus within normal limits. Similar appearance of mild mediastinal adenopathy. Lungs/Pleura: Chronic scarring at the left apex. Mild bronchiectasis at the right middle lobe and bilateral lung bases as before with interstitial thickening and mild ground-glass density consistent with chronic lung disease. New ground-glass opacities in the right greater than left upper lobes. No pleural effusion or pneumothorax. Mild mosaicism. Upper Abdomen: No acute abnormality. Musculoskeletal: No acute osseous abnormality. IMPRESSION: 1. Underlying chronic lung disease as demonstrated on previous exams with mild bronchiectasis and chronic ground-glass densities at the bases 2. Patchy ground-glass densities now visible within the right greater than left upper lobes, new as compared with chest CT from 2021 and may represent acute airspace disease or possible pneumonia superimposed on underlying chronic lung disease. Electronically Signed: By: Donavan Foil M.D. On: 05/27/2021 20:55   DG CHEST PORT 1 VIEW  Result Date: 05/26/2021 CLINICAL DATA:  Hypoxia, shortness of breath EXAM: PORTABLE CHEST 1 VIEW COMPARISON:  Previous studies including the examination done earlier today FINDINGS: Transverse diameter of heart is increased. Central pulmonary vessels are more prominent. There is increased interstitial markings in the parahilar regions and lower lung fields. There is poor inspiration. There is no focal consolidation. There is no pleural  effusion or pneumothorax. IMPRESSION: Central pulmonary vessels are more prominent, possibly suggesting CHF. Interstitial markings in the parahilar regions and lower lung fields are more prominent suggesting possible pulmonary edema or interstitial pneumonia. Electronically Signed   By: Elmer Picker M.D.   On: 05/26/2021 20:03   DG Chest Port 1 View  Result Date: 05/26/2021 CLINICAL DATA:  31 year old male with history of hypoxemia. Sickle cell crisis. EXAM: PORTABLE CHEST 1 VIEW COMPARISON:  Chest x-ray 04/09/2021. FINDINGS: Right internal jugular single-lumen power porta cath with tip terminating in the distal superior vena cava. Widespread areas of interstitial prominence are again noted throughout the lungs bilaterally, similar in appearance to the prior study. No confluent consolidative airspace disease. No pleural effusions. No pneumothorax. No suspicious appearing pulmonary nodules or masses are noted. No evidence of pulmonary edema. Heart size is normal. Upper mediastinal contours are within normal limits. IMPRESSION: 1. Chronic rather diffuse interstitial prominence, similar to prior examinations. No definite new acute findings are noted on today's study. 2. New right internal jugular single-lumen power porta cath with tip terminating in the  distal superior vena cava. No pneumothorax or other acute complicating features. Electronically Signed   By: Vinnie Langton M.D.   On: 05/26/2021 05:20    Microbiology: Recent Results (from the past 240 hour(s))  Resp Panel by RT-PCR (Flu A&B, Covid) Nasopharyngeal Swab     Status: None   Collection Time: 05/26/21 10:52 AM   Specimen: Nasopharyngeal Swab; Nasopharyngeal(NP) swabs in vial transport medium  Result Value Ref Range Status   SARS Coronavirus 2 by RT PCR NEGATIVE NEGATIVE Final    Comment: (NOTE) SARS-CoV-2 target nucleic acids are NOT DETECTED.  The SARS-CoV-2 RNA is generally detectable in upper respiratory specimens during the  acute phase of infection. The lowest concentration of SARS-CoV-2 viral copies this assay can detect is 138 copies/mL. A negative result does not preclude SARS-Cov-2 infection and should not be used as the sole basis for treatment or other patient management decisions. A negative result may occur with  improper specimen collection/handling, submission of specimen other than nasopharyngeal swab, presence of viral mutation(s) within the areas targeted by this assay, and inadequate number of viral copies(<138 copies/mL). A negative result must be combined with clinical observations, patient history, and epidemiological information. The expected result is Negative.  Fact Sheet for Patients:  EntrepreneurPulse.com.au  Fact Sheet for Healthcare Providers:  IncredibleEmployment.be  This test is no t yet approved or cleared by the [place] FDA and  has been authorized for detection and/or diagnosis of SARS-CoV-2 by FDA under an Emergency Use Authorization (EUA). This EUA will remain  in effect (meaning this test can be used) for the duration of the COVID-19 declaration under Section 564(b)(1) of the Act, 21 U.S.C.section 360bbb-3(b)(1), unless the authorization is terminated  or revoked sooner.       Influenza A by PCR NEGATIVE NEGATIVE Final   Influenza B by PCR NEGATIVE NEGATIVE Final    Comment: (NOTE) The Xpert Xpress SARS-CoV-2/FLU/RSV plus assay is intended as an aid in the diagnosis of influenza from Nasopharyngeal swab specimens and should not be used as a sole basis for treatment. Nasal washings and aspirates are unacceptable for Xpert Xpress SARS-CoV-2/FLU/RSV testing.  Fact Sheet for Patients: EntrepreneurPulse.com.au  Fact Sheet for Healthcare Providers: IncredibleEmployment.be  This test is not yet approved or cleared by the [place] FDA and has been authorized for detection and/or  diagnosis of SARS-CoV-2 by FDA under an Emergency Use Authorization (EUA). This EUA will remain in effect (meaning this test can be used) for the duration of the COVID-19 declaration under Section 564(b)(1) of the Act, 21 U.S.C. section 360bbb-3(b)(1), unless the authorization is terminated or revoked.  Performed at [facility], [place] [address] [place], [place] [address]   MRSA Next Gen by PCR, Nasal     Status: None   Collection Time: 05/28/21  5:12 PM   Specimen: Nasal Mucosa; Nasal Swab  Result Value Ref Range Status   MRSA by PCR Next Gen NOT DETECTED NOT DETECTED Final    Comment: (NOTE) The GeneXpert MRSA Assay (FDA approved for NASAL specimens only), is one component of a comprehensive MRSA colonization surveillance program. It is not intended to diagnose MRSA infection nor to guide or monitor treatment for MRSA infections. Test performance is not FDA approved in patients less than 56 years old. Performed at [facility], [place] [address], [place], [place] [address]      Labs: Basic Metabolic Panel: Recent Labs  Lab 05/27/21 0435 05/28/21 0515 05/30/21 0549 05/30/21 1225  NA 135  134*  --  135  K 4.2 4.1  --  4.2  CL 107 102  --  104  CO2 24 26  --  27  GLUCOSE 93 101*  --  95  BUN 9 11  --  13  CREATININE 0.69 0.75 0.71 0.77  CALCIUM 8.4* 8.7*  --  8.8*   Liver Function Tests: Recent Labs  Lab 05/30/21 1225  AST 27  ALT 13  ALKPHOS 52  BILITOT 3.9*  PROT 8.3*  ALBUMIN 3.5   No results for input(s): LIPASE, AMYLASE in the last 168 hours. No results for input(s): AMMONIA in the last 168 hours. CBC: Recent Labs  Lab 05/27/21 0435 05/28/21 0515 05/30/21 1225  WBC 13.1* 12.0* 10.7*  HGB 7.6* 7.8* 7.1*  HCT 20.3* 21.2* 19.2*  MCV 101.0* 98.1 96.0  PLT 276 291 357   Cardiac Enzymes: No results for input(s): CKTOTAL, CKMB, CKMBINDEX, TROPONINI in the last 168 hours. BNP: Invalid input(s): POCBNP CBG: No  results for input(s): GLUCAP in the last 168 hours.  Time coordinating discharge: 30 minutes  Signed:  Donia Pounds  APRN, [name], [name] [facility] [address] [place], [place] [address] [phone]  [facility] 06/02/2021, 2:30 PM

## 2021-06-04 ENCOUNTER — Other Ambulatory Visit: Payer: Self-pay | Admitting: Family Medicine

## 2021-06-05 DIAGNOSIS — R701 Abnormal plasma viscosity: Secondary | ICD-10-CM | POA: Insufficient documentation

## 2021-06-05 DIAGNOSIS — D72829 Elevated white blood cell count, unspecified: Secondary | ICD-10-CM | POA: Diagnosis not present

## 2021-06-05 DIAGNOSIS — D57219 Sickle-cell/Hb-C disease with crisis, unspecified: Secondary | ICD-10-CM | POA: Diagnosis not present

## 2021-06-05 DIAGNOSIS — D649 Anemia, unspecified: Secondary | ICD-10-CM | POA: Insufficient documentation

## 2021-06-05 DIAGNOSIS — Z21 Asymptomatic human immunodeficiency virus [HIV] infection status: Secondary | ICD-10-CM | POA: Diagnosis not present

## 2021-06-05 DIAGNOSIS — Z7901 Long term (current) use of anticoagulants: Secondary | ICD-10-CM | POA: Insufficient documentation

## 2021-06-05 DIAGNOSIS — M545 Low back pain, unspecified: Secondary | ICD-10-CM | POA: Diagnosis present

## 2021-06-06 ENCOUNTER — Encounter (HOSPITAL_COMMUNITY): Payer: Self-pay | Admitting: Emergency Medicine

## 2021-06-06 ENCOUNTER — Emergency Department (HOSPITAL_COMMUNITY): Payer: Medicaid Other

## 2021-06-06 ENCOUNTER — Emergency Department (HOSPITAL_COMMUNITY)
Admission: EM | Admit: 2021-06-06 | Discharge: 2021-06-06 | Disposition: A | Discharge status: Home / Self Care | Payer: Medicaid Other | Attending: Emergency Medicine | Admitting: Emergency Medicine

## 2021-06-06 ENCOUNTER — Other Ambulatory Visit: Payer: Self-pay

## 2021-06-06 DIAGNOSIS — D5700 Hb-SS disease with crisis, unspecified: Secondary | ICD-10-CM

## 2021-06-06 LAB — CBC WITH DIFFERENTIAL/PLATELET
Abs Immature Granulocytes: 0.03 10*3/uL (ref 0.00–0.07)
Basophils Absolute: 0.1 10*3/uL (ref 0.0–0.1)
Basophils Relative: 1 %
Eosinophils Absolute: 0.4 10*3/uL (ref 0.0–0.5)
Eosinophils Relative: 3 %
HCT: 19.8 % — ABNORMAL LOW (ref 39.0–52.0)
Hemoglobin: 7.7 g/dL — ABNORMAL LOW (ref 13.0–17.0)
Immature Granulocytes: 0 %
Lymphocytes Relative: 63 %
Lymphs Abs: 7.7 10*3/uL — ABNORMAL HIGH (ref 0.7–4.0)
MCH: 41.4 pg — ABNORMAL HIGH (ref 26.0–34.0)
MCHC: 38.9 g/dL — ABNORMAL HIGH (ref 30.0–36.0)
MCV: 106.5 fL — ABNORMAL HIGH (ref 80.0–100.0)
Monocytes Absolute: 1.2 10*3/uL — ABNORMAL HIGH (ref 0.1–1.0)
Monocytes Relative: 10 %
Neutro Abs: 2.8 10*3/uL (ref 1.7–7.7)
Neutrophils Relative %: 23 %
Platelets: 356 10*3/uL (ref 150–400)
RBC: 1.86 MIL/uL — ABNORMAL LOW (ref 4.22–5.81)
RDW: 28.9 % — ABNORMAL HIGH (ref 11.5–15.5)
WBC: 12.2 10*3/uL — ABNORMAL HIGH (ref 4.0–10.5)
nRBC: 85.2 % — ABNORMAL HIGH (ref 0.0–0.2)

## 2021-06-06 LAB — COMPREHENSIVE METABOLIC PANEL
ALT: 13 U/L (ref 0–44)
AST: 23 U/L (ref 15–41)
Albumin: 3.4 g/dL — ABNORMAL LOW (ref 3.5–5.0)
Alkaline Phosphatase: 67 U/L (ref 38–126)
Anion gap: 6 (ref 5–15)
BUN: 11 mg/dL (ref 6–20)
CO2: 22 mmol/L (ref 22–32)
Calcium: 8.8 mg/dL — ABNORMAL LOW (ref 8.9–10.3)
Chloride: 110 mmol/L (ref 98–111)
Creatinine, Ser: 0.78 mg/dL (ref 0.61–1.24)
GFR, Estimated: >60 mL/min (ref >60)
Glucose, Bld: 108 mg/dL — ABNORMAL HIGH (ref 70–99)
Potassium: 3.9 mmol/L (ref 3.5–5.1)
Sodium: 138 mmol/L (ref 135–145)
Total Bilirubin: 3.6 mg/dL — ABNORMAL HIGH (ref 0.3–1.2)
Total Protein: 8.4 g/dL — ABNORMAL HIGH (ref 6.5–8.1)

## 2021-06-06 LAB — RETICULOCYTES
Immature Retic Fract: 33.8 % — ABNORMAL HIGH (ref 2.3–15.9)
RBC.: 2 MIL/uL — ABNORMAL LOW (ref 4.22–5.81)
Retic Count, Absolute: 589 10*3/uL — ABNORMAL HIGH (ref 19.0–186.0)
Retic Ct Pct: 29.5 % — ABNORMAL HIGH (ref 0.4–3.1)

## 2021-06-06 LAB — TROPONIN I (HIGH SENSITIVITY): Troponin I (High Sensitivity): 3 ng/L (ref <18)

## 2021-06-06 MED ORDER — ONDANSETRON HCL 4 MG/2ML IJ SOLN: 4.0000 mg | Freq: Once | INTRAMUSCULAR | Status: DC

## 2021-06-06 MED ORDER — MORPHINE SULFATE 15 MG PO TABS
15.0000 mg | ORAL_TABLET | Freq: Once | ORAL | Status: AC
  Administered 2021-06-06: 15 mg via ORAL
  Filled 2021-06-06: qty 1

## 2021-06-06 MED ORDER — ACETAMINOPHEN 325 MG PO TABS
650.0000 mg | ORAL_TABLET | Freq: Once | ORAL | Status: AC
  Administered 2021-06-06: 650 mg via ORAL
  Filled 2021-06-06: qty 2

## 2021-06-06 MED ORDER — DIPHENHYDRAMINE HCL 50 MG/ML IJ SOLN: 25.0000 mg | Freq: Once | INTRAMUSCULAR | Status: DC

## 2021-06-06 MED ORDER — HYDROMORPHONE HCL 1 MG/ML IJ SOLN
0.5000 mg | INTRAMUSCULAR | Status: AC
  Administered 2021-06-06: 0.5 mg via SUBCUTANEOUS
  Filled 2021-06-06: qty 1

## 2021-06-06 MED ORDER — HYDROMORPHONE HCL 1 MG/ML IJ SOLN
1.0000 mg | Freq: Once | INTRAMUSCULAR | Status: AC
  Administered 2021-06-06: 1 mg via SUBCUTANEOUS
  Filled 2021-06-06: qty 1

## 2021-06-06 MED ORDER — SODIUM CHLORIDE 0.45 % IV SOLN: INTRAVENOUS | Status: DC

## 2021-06-06 MED ORDER — HYDROMORPHONE HCL 1 MG/ML IJ SOLN: 1.0000 mg | Freq: Once | INTRAMUSCULAR | Status: DC

## 2021-06-06 NOTE — Discharge Instructions (Signed)
Please follow up with your primary care provider within 5-7 days for re-evaluation of your symptoms. If you do not have a primary care provider, information for a healthcare clinic has been provided for you to make arrangements for follow up care. Please return to the emergency department for any new or worsening symptoms. ° °

## 2021-06-06 NOTE — ED Provider Notes (Signed)
Deputy DEPT Provider Note   CSN: [id number] Arrival date & time: 06/05/21  2319     History  Chief Complaint  Patient presents with   Sickle Cell Pain Crisis    Martin [name] is a 31 y.o. male.  HPI   Pt is a 31 y/o male with a h/o hiv, anxiety, proteinuria, sickle cell disease, who presents to the ED today for eval of sickle cell crisis. States pain started earlier today/. Pain located to lower back and hips. He also has pain to the upper back/chest. Denies sob or pleuritic pain. Denies cough or hemoptysis. Pt noted he is on home o2.   He has tried percocet 10mg  without relief. States he thinks he is dehydrated and has been on the move a lot lately. Reports he is under a lot of stress.   Home Medications Prior to Admission medications   Medication Sig Start Date End Date Taking? Authorizing Provider  abacavir-dolutegravir-lamiVUDine (TRIUMEQ) 600-50-300 MG tablet Take 1 tablet by mouth at bedtime.     [provider]  acetaminophen (TYLENOL) 325 MG tablet Take 650 mg by mouth every 6 (six) hours as needed for pain or headache. 11/28/20   [provider]  ALPRAZolam Duanne Moron) 1 MG tablet Take 1 mg by mouth 3 (three) times daily as needed for anxiety.    [provider]  apixaban (ELIQUIS) 5 MG TABS tablet Take 5 mg by mouth 2 (two) times daily.    [provider]  cetirizine (ZYRTEC ALLERGY) 10 MG tablet Take 1 tablet (10 mg total) by mouth daily as needed for allergies. 04/18/21 04/18/22  [name], [name]  cyclobenzaprine (FLEXERIL) 10 MG tablet Take 10 mg by mouth 3 (three) times daily as needed for muscle spasms. 03/04/21   [provider]  doxycycline (VIBRA-TABS) 100 MG tablet Take 100 mg by mouth 2 (two) times daily. 05/24/21   [provider]  hydroxyurea (HYDREA) 500 MG capsule Take 4 capsules (2,000 mg total) by mouth at bedtime. 10/07/20   [name], [name]  ibuprofen  (ADVIL) 600 MG tablet Take 600 mg by mouth 3 (three) times daily as needed for pain. 05/20/21   [provider]  melatonin 3 MG TABS tablet Take 6 mg by mouth daily as needed for sleep. 03/04/21   [provider]  Multiple Vitamin (MULTIVITAMIN WITH MINERALS) TABS tablet Take 1 tablet by mouth daily with breakfast.     [provider]  naloxone (NARCAN) nasal spray 4 mg/0.1 mL Place 4 mg into the nose as needed for opioid reversal. 05/19/17   [provider]  oxyCODONE-acetaminophen (PERCOCET) 10-325 MG tablet Take 1 tablet by mouth every 4 (four) hours as needed for pain. 05/16/21 05/16/22  [name], [name]  promethazine (PHENERGAN) 12.5 MG tablet Take 12.5 mg by mouth every 8 (eight) hours as needed for nausea/vomiting. 02/04/21   [provider]  SENEXON-S 8.6-50 MG tablet Take 1 tablet by mouth daily as needed for constipation. 05/20/21   [provider]  Vitamin D, Ergocalciferol, (DRISDOL) 1.25 MG (50000 UT) CAPS capsule Take 1 capsule (50,000 Units total) by mouth every 7 (seven) days. Patient taking differently: Take 50,000 Units by mouth every Tuesday. 11/01/18   Azzie Glatter, [name]      Allergies    Ketorolac tromethamine, Tape, and Wound dressing adhesive    Review of Systems   Review of Systems  Physical Exam Updated Vital Signs BP 106/71 (BP  Location: Left Arm)    Pulse 78    Temp 98.1 F (36.7 C) (Oral)    Resp 18    Ht 6\' 3"  (1.905 m)    Wt 64.5 kg    SpO2 90%    BMI 17.79 kg/m  Physical Exam  ED Results / Procedures / Treatments   Labs (all labs ordered are listed, but only abnormal results are displayed) Labs Reviewed  COMPREHENSIVE METABOLIC PANEL - Abnormal; Notable for the following components:      Result Value   Glucose, Bld 108 (*)    Calcium 8.8 (*)    Total Protein 8.4 (*)    Albumin 3.4 (*)    Total Bilirubin 3.6 (*)    All other components within normal limits  CBC WITH DIFFERENTIAL/PLATELET -  Abnormal; Notable for the following components:   WBC 12.2 (*)    RBC 1.86 (*)    Hemoglobin 7.7 (*)    HCT 19.8 (*)    MCV 106.5 (*)    MCH 41.4 (*)    MCHC 38.9 (*)    RDW 28.9 (*)    nRBC 85.2 (*)    Lymphs Abs 7.7 (*)    Monocytes Absolute 1.2 (*)    All other components within normal limits  RETICULOCYTES - Abnormal; Notable for the following components:   Retic Ct Pct 29.5 (*)    RBC. 2.00 (*)    Retic Count, Absolute 589.0 (*)    Immature Retic Fract 33.8 (*)    All other components within normal limits  TROPONIN I (HIGH SENSITIVITY)  TROPONIN I (HIGH SENSITIVITY)    EKG EKG Interpretation  Date/Time:  Friday June 06 2021 00:21:06 EST Ventricular Rate:  81 PR Interval:  167 QRS Duration: 85 QT Interval:  399 QTC Calculation: 464 R Axis:   82 Text Interpretation: Sinus rhythm Confirmed by Quintella Reichert [phone]) on 06/06/2021 4:11:56 AM  Radiology DG Chest 2 View  Result Date: 06/06/2021 CLINICAL DATA:  Chest pain. EXAM: CHEST - 2 VIEW COMPARISON:  Chest radiograph dated 05/27/2021. FINDINGS: Right-sided Port-A-Cath in similar position. Background of emphysema and chronic interstitial coarsening. No focal consolidation, pleural effusion, pneumothorax. The cardiac silhouette is within normal limits. No acute osseous pathology. IMPRESSION: 1. No active cardiopulmonary disease. 2. Emphysema. Electronically Signed   By: Anner Crete M.D.   On: 06/06/2021 02:12    Procedures Procedures    Medications Ordered in ED Medications  ondansetron (ZOFRAN) injection 4 mg (has no administration in time range)  0.45 % sodium chloride infusion (has no administration in time range)  HYDROmorphone (DILAUDID) injection 1 mg (has no administration in time range)  HYDROmorphone (DILAUDID) injection 0.5 mg (0.5 mg Subcutaneous Given 06/06/21 0133)  HYDROmorphone (DILAUDID) injection 0.5 mg (0.5 mg Subcutaneous Given 06/06/21 0224)  morphine (MSIR) tablet 15 mg (15 mg Oral Given  06/06/21 0352)  acetaminophen (TYLENOL) tablet 650 mg (650 mg Oral Given 06/06/21 [id number])    ED Course/ Medical Decision Making/ A&P                           Medical Decision Making Amount and/or Complexity of Data Reviewed Labs: ordered. Radiology: ordered.  Risk OTC drugs. Prescription drug management.   This patient presents to the ED for concern of pain, this involves an extensive number of treatment options, and is a complaint that carries with it a high risk of complications and morbidity.  The differential diagnosis includes but  is not limited to sickle cell crisis, sickle cell pain, infectious process, acs, pe, dissection   Comorbidities that complicate the patient evaluation: Patients presentation is complicated by their history of sickle cell anemia  Additional history obtained: Records reviewed previous admission documents and Care Everywhere/External Records  Lab Tests: I Ordered, and personally interpreted labs.  The pertinent results include:   CBC with mild leukocytosis, anemia is stable CMP at baseline Retic counts elevated Trop negative  Ekg - Sinus rhythm   Imaging Studies ordered: I ordered, independently visualized, and interpreted imaging which showed  Cxr - 1. No active cardiopulmonary disease. 2. Emphysema.   I agree with the radiologist interpretation  Cardiac Monitoring: The patient was maintained on a cardiac monitor.  I personally viewed and interpreted the cardiac monitor which showed an underlying rhythm of:  sinus rhythm  Medicines ordered and prescription drug management: I ordered medication including pain meds, antiemetics  for pain  Reevaluation of the patient after these medicines showed that the patient    improved  Critical Interventions: pain meds  Complexity of problems addressed: Patients presentation is most consistent with  acute complicated illness/injury requiring diagnostic workup  Disposition: After consideration of the  diagnostic results and the patients response to treatment,  I feel that the patent would benefit from discharge home. Pt w/u here reassuring. No complications from his sickle cell. Cardiac/pulm w/u neg. Doubt pe or other emergent cause of sxs. He is feeling improved after receiving meds here. He voices understanding of the plan and reasons to return. All questions answered, pt stable for discharge .    Final Clinical Impression(s) / ED Diagnoses Final diagnoses:  Sickle cell pain crisis [facility])    Rx / DC Orders ED Discharge Orders     None         Bishop Dublin 06/06/21 [id number]    Quintella Reichert, MD 06/06/21 [phone]

## 2021-06-06 NOTE — ED Triage Notes (Signed)
Patient complaining of sickle cell pain all over. Patient states it started today.

## 2021-06-06 NOTE — ED Notes (Signed)
Pt wants labs drawn from port RN made aware

## 2021-06-13 ENCOUNTER — Telehealth: Payer: Self-pay

## 2021-06-13 NOTE — Telephone Encounter (Signed)
Oxycodone  °

## 2021-06-16 ENCOUNTER — Telehealth: Payer: Self-pay

## 2021-06-16 ENCOUNTER — Other Ambulatory Visit: Payer: Self-pay | Admitting: Family Medicine

## 2021-06-16 DIAGNOSIS — G8929 Other chronic pain: Secondary | ICD-10-CM

## 2021-06-16 DIAGNOSIS — D571 Sickle-cell disease without crisis: Secondary | ICD-10-CM

## 2021-06-16 MED ORDER — OXYCODONE-ACETAMINOPHEN 10-325 MG PO TABS: 1.0000 | ORAL_TABLET | ORAL | 0 refills | Status: DC | PRN

## 2021-06-16 NOTE — Telephone Encounter (Signed)
Oxycodone  °

## 2021-06-16 NOTE — Progress Notes (Signed)
Reviewed PDMP substance reporting system prior to prescribing opiate medications. No inconsistencies noted.  Meds ordered this encounter  Medications   oxyCODONE-acetaminophen (PERCOCET) 10-325 MG tablet    Sig: Take 1 tablet by mouth every 4 (four) hours as needed for pain.    Dispense:  90 tablet    Refill:  0    Order Specific Question:   Supervising Provider    Answer:   JEGEDE, OLUGBEMIGA E [[id number]]   Krystianna Soth Moore Laddie Math  APRN, [name], [name] Patient Care Center [facility] [address]  [place], [place] [address] [phone]  

## 2021-06-24 ENCOUNTER — Encounter (HOSPITAL_COMMUNITY): Payer: Self-pay | Admitting: Family Medicine

## 2021-06-24 ENCOUNTER — Telehealth (HOSPITAL_COMMUNITY): Payer: Self-pay

## 2021-06-24 ENCOUNTER — Other Ambulatory Visit: Payer: Self-pay

## 2021-06-24 ENCOUNTER — Inpatient Hospital Stay (HOSPITAL_COMMUNITY)
Admission: AD | Admit: 2021-06-24 | Discharge: 2021-06-30 | DRG: 812 | Disposition: A | Discharge status: Home / Self Care | Payer: Medicaid Other | Source: Ambulatory Visit | Attending: Internal Medicine | Admitting: Internal Medicine

## 2021-06-24 DIAGNOSIS — Z888 Allergy status to other drugs, medicaments and biological substances status: Secondary | ICD-10-CM

## 2021-06-24 DIAGNOSIS — G8929 Other chronic pain: Secondary | ICD-10-CM

## 2021-06-24 DIAGNOSIS — R636 Underweight: Secondary | ICD-10-CM | POA: Diagnosis present

## 2021-06-24 DIAGNOSIS — Z86711 Personal history of pulmonary embolism: Secondary | ICD-10-CM | POA: Diagnosis present

## 2021-06-24 DIAGNOSIS — R0902 Hypoxemia: Secondary | ICD-10-CM | POA: Diagnosis present

## 2021-06-24 DIAGNOSIS — Z79899 Other long term (current) drug therapy: Secondary | ICD-10-CM

## 2021-06-24 DIAGNOSIS — Z7901 Long term (current) use of anticoagulants: Secondary | ICD-10-CM

## 2021-06-24 DIAGNOSIS — E871 Hypo-osmolality and hyponatremia: Secondary | ICD-10-CM | POA: Diagnosis present

## 2021-06-24 DIAGNOSIS — K5900 Constipation, unspecified: Secondary | ICD-10-CM | POA: Diagnosis present

## 2021-06-24 DIAGNOSIS — J9611 Chronic respiratory failure with hypoxia: Secondary | ICD-10-CM | POA: Diagnosis present

## 2021-06-24 DIAGNOSIS — Z21 Asymptomatic human immunodeficiency virus [HIV] infection status: Secondary | ICD-10-CM | POA: Diagnosis present

## 2021-06-24 DIAGNOSIS — Z681 Body mass index (BMI) 19 or less, adult: Secondary | ICD-10-CM

## 2021-06-24 DIAGNOSIS — Z885 Allergy status to narcotic agent status: Secondary | ICD-10-CM

## 2021-06-24 DIAGNOSIS — B20 Human immunodeficiency virus [HIV] disease: Secondary | ICD-10-CM | POA: Diagnosis present

## 2021-06-24 DIAGNOSIS — Z79890 Hormone replacement therapy (postmenopausal): Secondary | ICD-10-CM

## 2021-06-24 DIAGNOSIS — F1120 Opioid dependence, uncomplicated: Secondary | ICD-10-CM | POA: Diagnosis present

## 2021-06-24 DIAGNOSIS — D72829 Elevated white blood cell count, unspecified: Secondary | ICD-10-CM | POA: Diagnosis present

## 2021-06-24 DIAGNOSIS — D5700 Hb-SS disease with crisis, unspecified: Principal | ICD-10-CM

## 2021-06-24 DIAGNOSIS — Z20822 Contact with and (suspected) exposure to covid-19: Secondary | ICD-10-CM | POA: Diagnosis present

## 2021-06-24 DIAGNOSIS — D571 Sickle-cell disease without crisis: Secondary | ICD-10-CM

## 2021-06-24 DIAGNOSIS — Z832 Family history of diseases of the blood and blood-forming organs and certain disorders involving the immune mechanism: Secondary | ICD-10-CM

## 2021-06-24 DIAGNOSIS — F419 Anxiety disorder, unspecified: Secondary | ICD-10-CM | POA: Diagnosis present

## 2021-06-24 DIAGNOSIS — R1012 Left upper quadrant pain: Secondary | ICD-10-CM

## 2021-06-24 DIAGNOSIS — G894 Chronic pain syndrome: Secondary | ICD-10-CM | POA: Diagnosis present

## 2021-06-24 DIAGNOSIS — R109 Unspecified abdominal pain: Secondary | ICD-10-CM

## 2021-06-24 DIAGNOSIS — F411 Generalized anxiety disorder: Secondary | ICD-10-CM | POA: Diagnosis present

## 2021-06-24 LAB — CBC WITH DIFFERENTIAL/PLATELET
Abs Immature Granulocytes: 0.06 10*3/uL (ref 0.00–0.07)
Basophils Absolute: 0 10*3/uL (ref 0.0–0.1)
Basophils Relative: 0 %
Eosinophils Absolute: 0.1 10*3/uL (ref 0.0–0.5)
Eosinophils Relative: 1 %
HCT: 21.8 % — ABNORMAL LOW (ref 39.0–52.0)
Hemoglobin: 8.2 g/dL — ABNORMAL LOW (ref 13.0–17.0)
Immature Granulocytes: 0 %
Lymphocytes Relative: 27 %
Lymphs Abs: 4 10*3/uL (ref 0.7–4.0)
MCH: 35.5 pg — ABNORMAL HIGH (ref 26.0–34.0)
MCHC: 37.6 g/dL — ABNORMAL HIGH (ref 30.0–36.0)
MCV: 94.4 fL (ref 80.0–100.0)
Monocytes Absolute: 1.4 10*3/uL — ABNORMAL HIGH (ref 0.1–1.0)
Monocytes Relative: 10 %
Neutro Abs: 9 10*3/uL — ABNORMAL HIGH (ref 1.7–7.7)
Neutrophils Relative %: 62 %
Platelets: 340 10*3/uL (ref 150–400)
RBC: 2.31 MIL/uL — ABNORMAL LOW (ref 4.22–5.81)
RDW: 24.2 % — ABNORMAL HIGH (ref 11.5–15.5)
WBC: 14.6 10*3/uL — ABNORMAL HIGH (ref 4.0–10.5)
nRBC: 12.5 % — ABNORMAL HIGH (ref 0.0–0.2)

## 2021-06-24 LAB — COMPREHENSIVE METABOLIC PANEL
ALT: 16 U/L (ref 0–44)
AST: 34 U/L (ref 15–41)
Albumin: 3.7 g/dL (ref 3.5–5.0)
Alkaline Phosphatase: 66 U/L (ref 38–126)
Anion gap: 3 — ABNORMAL LOW (ref 5–15)
BUN: 11 mg/dL (ref 6–20)
CO2: 25 mmol/L (ref 22–32)
Calcium: 8.2 mg/dL — ABNORMAL LOW (ref 8.9–10.3)
Chloride: 104 mmol/L (ref 98–111)
Creatinine, Ser: 0.81 mg/dL (ref 0.61–1.24)
GFR, Estimated: >60 mL/min (ref >60)
Glucose, Bld: 83 mg/dL (ref 70–99)
Potassium: 4.6 mmol/L (ref 3.5–5.1)
Sodium: 132 mmol/L — ABNORMAL LOW (ref 135–145)
Total Bilirubin: 7.3 mg/dL — ABNORMAL HIGH (ref 0.3–1.2)
Total Protein: 8.5 g/dL — ABNORMAL HIGH (ref 6.5–8.1)

## 2021-06-24 LAB — RETICULOCYTES
Immature Retic Fract: 32.5 % — ABNORMAL HIGH (ref 2.3–15.9)
RBC.: 2.35 MIL/uL — ABNORMAL LOW (ref 4.22–5.81)
Retic Count, Absolute: 605 10*3/uL — ABNORMAL HIGH (ref 19.0–186.0)
Retic Ct Pct: 25.8 % — ABNORMAL HIGH (ref 0.4–3.1)

## 2021-06-24 LAB — RESP PANEL BY RT-PCR (FLU A&B, COVID) ARPGX2
Influenza A by PCR: NEGATIVE
Influenza B by PCR: NEGATIVE
SARS Coronavirus 2 by RT PCR: NEGATIVE

## 2021-06-24 MED ORDER — SENNOSIDES-DOCUSATE SODIUM 8.6-50 MG PO TABS
1.0000 | ORAL_TABLET | Freq: Two times a day (BID) | ORAL | Status: DC
  Administered 2021-06-24 – 2021-06-30 (×11): 1 via ORAL
  Filled 2021-06-24 (×12): qty 1

## 2021-06-24 MED ORDER — HYDROMORPHONE 1 MG/ML IV SOLN
INTRAVENOUS | Status: DC
  Administered 2021-06-24: 4 mg via INTRAVENOUS
  Administered 2021-06-24: 7 mg via INTRAVENOUS
  Administered 2021-06-24: 8.5 mg via INTRAVENOUS
  Administered 2021-06-25: 30 mg via INTRAVENOUS
  Administered 2021-06-25: 2.5 mg via INTRAVENOUS
  Administered 2021-06-25: 5 mg via INTRAVENOUS
  Administered 2021-06-25: 3.5 mg via INTRAVENOUS
  Administered 2021-06-25: 4.5 mg via INTRAVENOUS
  Administered 2021-06-26: 30 mg via INTRAVENOUS
  Administered 2021-06-26: 5.43 mg via INTRAVENOUS
  Administered 2021-06-26: 7.5 mg via INTRAVENOUS
  Administered 2021-06-26: 1 mg via INTRAVENOUS
  Administered 2021-06-26: 7.5 mg via INTRAVENOUS
  Administered 2021-06-27: 3.5 mg via INTRAVENOUS
  Administered 2021-06-27: 6 mg via INTRAVENOUS
  Administered 2021-06-27: 3 mg via INTRAVENOUS
  Administered 2021-06-27: 5.5 mg via INTRAVENOUS
  Filled 2021-06-24 (×3): qty 30

## 2021-06-24 MED ORDER — LORATADINE 10 MG PO TABS
10.0000 mg | ORAL_TABLET | Freq: Every day | ORAL | Status: DC
  Administered 2021-06-24 – 2021-06-30 (×5): 10 mg via ORAL
  Filled 2021-06-24 (×7): qty 1

## 2021-06-24 MED ORDER — HYDROXYUREA 500 MG PO CAPS
2000.0000 mg | ORAL_CAPSULE | Freq: Every day | ORAL | Status: DC
  Administered 2021-06-24 – 2021-06-29 (×6): 2000 mg via ORAL
  Filled 2021-06-24 (×6): qty 4

## 2021-06-24 MED ORDER — SODIUM CHLORIDE 0.9% FLUSH: 9.0000 mL | INTRAVENOUS | Status: DC | PRN

## 2021-06-24 MED ORDER — DIPHENHYDRAMINE HCL 25 MG PO CAPS
25.0000 mg | ORAL_CAPSULE | ORAL | Status: DC | PRN
  Administered 2021-06-24: 25 mg via ORAL
  Filled 2021-06-24 (×2): qty 1

## 2021-06-24 MED ORDER — CYCLOBENZAPRINE HCL 10 MG PO TABS
10.0000 mg | ORAL_TABLET | Freq: Three times a day (TID) | ORAL | Status: DC | PRN
  Administered 2021-06-25: 10 mg via ORAL
  Filled 2021-06-24: qty 1

## 2021-06-24 MED ORDER — ALPRAZOLAM 1 MG PO TABS
1.0000 mg | ORAL_TABLET | Freq: Three times a day (TID) | ORAL | Status: DC | PRN
Start: 2021-06-24 — End: 2021-06-30
  Administered 2021-06-24 – 2021-06-29 (×8): 1 mg via ORAL
  Filled 2021-06-24 (×8): qty 1

## 2021-06-24 MED ORDER — CHLORHEXIDINE GLUCONATE CLOTH 2 % EX PADS
6.0000 | MEDICATED_PAD | Freq: Every day | CUTANEOUS | Status: DC
  Administered 2021-06-25 – 2021-06-30 (×6): 6 via TOPICAL

## 2021-06-24 MED ORDER — APIXABAN 5 MG PO TABS
5.0000 mg | ORAL_TABLET | Freq: Two times a day (BID) | ORAL | Status: DC
  Administered 2021-06-24 – 2021-06-30 (×12): 5 mg via ORAL
  Filled 2021-06-24 (×12): qty 1

## 2021-06-24 MED ORDER — ONDANSETRON HCL 4 MG/2ML IJ SOLN: 4.0000 mg | Freq: Four times a day (QID) | INTRAMUSCULAR | Status: DC | PRN

## 2021-06-24 MED ORDER — SODIUM CHLORIDE 0.9% FLUSH: 10.0000 mL | INTRAVENOUS | Status: DC | PRN

## 2021-06-24 MED ORDER — SODIUM CHLORIDE 0.45 % IV SOLN: INTRAVENOUS | Status: DC

## 2021-06-24 MED ORDER — MELATONIN 3 MG PO TABS: 6.0000 mg | ORAL_TABLET | Freq: Every day | ORAL | Status: DC | PRN

## 2021-06-24 MED ORDER — ABACAVIR-DOLUTEGRAVIR-LAMIVUD 600-50-300 MG PO TABS
1.0000 | ORAL_TABLET | Freq: Every day | ORAL | Status: DC
  Administered 2021-06-24 – 2021-06-29 (×6): 1 via ORAL
  Filled 2021-06-24 (×6): qty 1

## 2021-06-24 MED ORDER — POLYETHYLENE GLYCOL 3350 17 G PO PACK: 17.0000 g | PACK | Freq: Every day | ORAL | Status: DC | PRN

## 2021-06-24 MED ORDER — ADULT MULTIVITAMIN W/MINERALS CH
1.0000 | ORAL_TABLET | Freq: Every day | ORAL | Status: DC
  Administered 2021-06-25 – 2021-06-28 (×3): 1 via ORAL
  Filled 2021-06-24 (×5): qty 1

## 2021-06-24 MED ORDER — HEPARIN SOD (PORK) LOCK FLUSH 100 UNIT/ML IV SOLN
500.0000 [IU] | INTRAVENOUS | Status: DC | PRN
  Administered 2021-06-30: 500 [IU]
  Filled 2021-06-24: qty 5

## 2021-06-24 MED ORDER — NALOXONE HCL 0.4 MG/ML IJ SOLN: 0.4000 mg | INTRAMUSCULAR | Status: DC | PRN

## 2021-06-24 NOTE — Progress Notes (Addendum)
Pt admitted to the day hospital for treatment of sickle cell pain crisis. Pt reported generalized pain rated a 10/10. Pt placed on Dilaudid PCA settings 0.5/10/3, and hydrated with IV fluids. Patient oxygen saturation remained in the mid to high  80s throughout stay in the day hospital, placed on 3 L [place]. [place] [name] made aware, states pt wears oxygen at home but is non complaint with wearing canula, no further intervention required.  Pt reports pain not adequately managed in the day hospital, [place] [name] notified and orders for hospital admission placed. Report called to [facility], pt taken in wheelchair to 6East Bed 1 by staff.  Pt reports pain a 7/10 at transfer, pt alert and oriented at admission.  ?

## 2021-06-24 NOTE — H&P (Signed)
H&P  Patient Demographics:  Martin [name], is a 31 y.o. male  MRN: [id number]   DOB - Dec 24, 1990  Admit Date - 06/24/2021  Outpatient Primary MD for the patient is Massie Maroon, [name]      HPI:   Martin [name]  is a 31 y.o. male with a medical history significant for sickle cell disease, chronic pain syndrome, opiate dependence and tolerance, history of anemia of chronic disease, history of HIV on antiviral therapy, history of PE on Eliquis, chronic hypoxia on home oxygen presents to sickle cell day infusion center with complaints of generalized pain that is consistent with his typical crisis.  Patient states that pain intensity has been increased over the past 2 days and unrelieved by his home medications.  He has not identified any palliative or provocative factors concerning current crisis.  Patient says that pain is primarily to chest, low back, and lower extremities and he rates it at 9/10.  He characterizes pain as constant and occasionally sharp.  He last had Percocet 10-325 mg this a.m. without any sustained relief.  He denies any fever, chills, shortness of breath, or fatigue.  No dizziness, urinary symptoms, nausea, vomiting, or diarrhea.  No sick contacts, recent travel, or known exposure to COVID-19.  Sickle cell center course: While at sickle cell center, oxygen saturation 80-90% on room air, improved above 90% on 3 L supplemental oxygen.  Patient afebrile.  Blood pressure 99/63, pulse 84, respirations 11.  Complete blood count shows WBCs 14.6, hemoglobin 8.2 g/dL, and platelets [phone],628.  Complete metabolic panel shows sodium 132 and bilirubin 7.3, otherwise unremarkable.  COVID-19/influenza test pending.  Pain persists despite IV Dilaudid PCA, and IV fluids.  Patient will be admitted to MedSurg and observation for further management of sickle cell pain crisis.    Review of systems:  In addition to the HPI above, patient reports No fever or chills No Headache, No changes  with vision or hearing No problems swallowing food or liquids No chest pain, cough or shortness of breath No abdominal pain, No nausea or vomiting, Bowel movements are regular No blood in stool or urine No dysuria No new skin rashes or bruises No new joints pains-aches No new weakness, tingling, numbness in any extremity No recent weight gain or loss No polyuria, polydypsia or polyphagia No significant Mental Stressors  With Past History of the following :   Past Medical History:  Diagnosis Date   Anxiety    HIV (human immunodeficiency virus infection) (HCC)    Proteinuria    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HCC)    Vitamin D deficiency 10/2018      Past Surgical History:  Procedure Laterality Date   IR IMAGING GUIDED PORT INSERTION  08/29/2019   IR REMOVAL TUN ACCESS W/ PORT W/O FL MOD SED  08/30/2020   TEE WITHOUT CARDIOVERSION N/A 08/30/2020   Procedure: TRANSESOPHAGEAL ECHOCARDIOGRAM (TEE);  Surgeon: [name], MD;  Location: [facility] ENDOSCOPY;  Service: Cardiovascular;  Laterality: N/A;     Social History:   Social History   Tobacco Use   Smoking status: Never   Smokeless tobacco: Never  Substance Use Topics   Alcohol use: No     Lives - At home   Family History :   Family History  Problem Relation Age of Onset   Sickle cell trait Mother    Sickle cell trait Father    Birth defects Maternal Grandmother    Birth defects Paternal Grandmother      Home  Medications:   Prior to Admission medications   Medication Sig Start Date End Date Taking? Authorizing Provider  abacavir-dolutegravir-lamiVUDine (TRIUMEQ) 600-50-300 MG tablet Take 1 tablet by mouth at bedtime.    Yes [provider]  acetaminophen (TYLENOL) 325 MG tablet Take 650 mg by mouth every 6 (six) hours as needed for pain or headache. 11/28/20  Yes [provider]  ALPRAZolam Prudy Feeler) 1 MG tablet Take 1 mg by mouth 3 (three) times daily as needed for anxiety.   Yes [provider]   apixaban (ELIQUIS) 5 MG TABS tablet Take 5 mg by mouth 2 (two) times daily.   Yes [provider]  cetirizine (ZYRTEC ALLERGY) 10 MG tablet Take 1 tablet (10 mg total) by mouth daily as needed for allergies. 04/18/21 04/18/22 Yes Massie Maroon, [name]  cyclobenzaprine (FLEXERIL) 10 MG tablet Take 10 mg by mouth 3 (three) times daily as needed for muscle spasms. 03/04/21  Yes [provider]  hydroxyurea (HYDREA) 500 MG capsule Take 4 capsules (2,000 mg total) by mouth at bedtime. 10/07/20  Yes Massie Maroon, [name]  ibuprofen (ADVIL) 600 MG tablet Take 600 mg by mouth 3 (three) times daily as needed for pain. 05/20/21  Yes [provider]  melatonin 3 MG TABS tablet Take 6 mg by mouth daily as needed for sleep. 03/04/21  Yes [provider]  Multiple Vitamin (MULTIVITAMIN WITH MINERALS) TABS tablet Take 1 tablet by mouth daily with breakfast.    Yes [provider]  oxyCODONE-acetaminophen (PERCOCET) 10-325 MG tablet Take 1 tablet by mouth every 4 (four) hours as needed for pain. 06/16/21 06/16/22 Yes Massie Maroon, [name]  promethazine (PHENERGAN) 12.5 MG tablet Take 12.5 mg by mouth every 8 (eight) hours as needed for nausea/vomiting. 02/04/21  Yes [provider]  SENEXON-S 8.6-50 MG tablet Take 1 tablet by mouth daily as needed for constipation. 05/20/21  Yes [provider]  Vitamin D, Ergocalciferol, (DRISDOL) 1.25 MG (50000 UT) CAPS capsule Take 1 capsule (50,000 Units total) by mouth every 7 (seven) days. Patient taking differently: Take 50,000 Units by mouth every Tuesday. 11/01/18  Yes [name], [name]  naloxone [facility]) nasal spray 4 mg/0.1 mL Place 4 mg into the nose as needed for opioid reversal. 05/19/17   [provider]     Allergies:   Allergies  Allergen Reactions   Ketorolac Tromethamine Swelling and Other (See Comments)    Patient reports facial edema and left arm edema after administration.   Tape  Rash and Other (See Comments)    PLEASE DO NOT USE THE CLEAR, THICK, "PLASTIC" TAPE- only paper tape is tolerated    Wound Dressing Adhesive Rash     Physical Exam:   Vitals:   Vitals:   06/24/21 1137 06/24/21 1300  BP:  99/63  Pulse:  84  Resp:  11  Temp:  98.9 F (37.2 C)  SpO2: 90% (!) 89%    Physical Exam: Constitutional: Patient appears well-developed and well-nourished. Not in obvious distress. HENT: Normocephalic, atraumatic, External right and left ear normal. Oropharynx is clear and moist.  Eyes: Conjunctivae and EOM are normal. PERRLA, no scleral icterus. Neck: Normal ROM. Neck supple. No JVD. No tracheal deviation. No thyromegaly. CVS: RRR, S1/S2 +, no murmurs, no gallops, no carotid bruit.  Pulmonary: Effort and breath sounds normal, no stridor, rhonchi, wheezes, rales.  Abdominal: Soft. BS +, no distension, tenderness, rebound or guarding.  Musculoskeletal: Normal range of motion. No edema and no  tenderness.  Lymphadenopathy: No lymphadenopathy noted, cervical, inguinal or axillary Neuro: Alert. Normal reflexes, muscle tone coordination. No cranial nerve deficit. Skin: Skin is warm and dry. No rash noted. Not diaphoretic. No erythema. No pallor. Psychiatric: Normal mood and affect. Behavior, judgment, thought content normal.   Data Review:   CBC Recent Labs  Lab 06/24/21 1115  WBC 14.6*  HGB 8.2*  HCT 21.8*  PLT 340  MCV 94.4  MCH 35.5*  MCHC 37.6*  RDW 24.2*  LYMPHSABS 4.0  MONOABS 1.4*  EOSABS 0.1  BASOSABS 0.0   ------------------------------------------------------------------------------------------------------------------  Chemistries  Recent Labs  Lab 06/24/21 1115  NA 132*  K 4.6  CL 104  CO2 25  GLUCOSE 83  BUN 11  CREATININE 0.81  CALCIUM 8.2*  AST 34  ALT 16  ALKPHOS 66  BILITOT 7.3*   ------------------------------------------------------------------------------------------------------------------ CrCl cannot be  calculated (Unknown ideal weight.). ------------------------------------------------------------------------------------------------------------------ No results for input(s): TSH, T4TOTAL, T3FREE, THYROIDAB in the last 72 hours.  Invalid input(s): FREET3  Coagulation profile No results for input(s): INR, PROTIME in the last 168 hours. ------------------------------------------------------------------------------------------------------------------- No results for input(s): DDIMER in the last 72 hours. -------------------------------------------------------------------------------------------------------------------  Cardiac Enzymes No results for input(s): CKMB, TROPONINI, MYOGLOBIN in the last 168 hours.  Invalid input(s): CK ------------------------------------------------------------------------------------------------------------------    Component Value Date/Time   BNP 91.4 05/27/2021 2216    ---------------------------------------------------------------------------------------------------------------  Urinalysis    Component Value Date/Time   COLORURINE YELLOW 05/28/2021 1641   APPEARANCEUR CLEAR 05/28/2021 1641   LABSPEC 1.009 05/28/2021 1641   PHURINE 5.0 05/28/2021 1641   GLUCOSEU NEGATIVE 05/28/2021 1641   HGBUR NEGATIVE 05/28/2021 1641   BILIRUBINUR NEGATIVE 05/28/2021 1641   BILIRUBINUR small (A) 03/18/2021 1218   BILIRUBINUR neg 11/27/2019 1149   KETONESUR NEGATIVE 05/28/2021 1641   PROTEINUR NEGATIVE 05/28/2021 1641   UROBILINOGEN 2.0 (A) 03/18/2021 1218   UROBILINOGEN 4.0 (H) 05/21/2009 1944   NITRITE NEGATIVE 05/28/2021 1641   LEUKOCYTESUR MODERATE (A) 05/28/2021 1641    ----------------------------------------------------------------------------------------------------------------   Imaging Results:    No results found.   Assessment & Plan:  Principal Problem:   Sickle cell pain crisis (HCC) Active Problems:   Generalized anxiety disorder    Human immunodeficiency virus (HIV) disease (HCC)   History of pulmonary embolus (PE)   Hypoxia   Sickle cell crisis (HCC)  Sickle cell disease with pain crisis: Continue IV Dilaudid PCA Hold Toradol, listed allergy IV fluids, 0.45% saline at 100 mL/h Supplemental oxygen at 3 L Monitor vital signs very closely, reevaluate pain scale regularly, and supplemental oxygen as needed Patient will be reevaluated for pain in the context of function and relationship to baseline as  Chronic pain syndrome: Hold Percocet, use PCA Dilaudid.  We will de-escalate IV Dilaudid as pain intensity improves.  Anemia of chronic disease: Hemoglobin 8.2 g/dL, consistent with patient's baseline.  There is no clinical indication for blood transfusion at this time.  Continue to monitor closely.  Leukocytosis: WBCs 14.4.  Patient is afebrile with out any apparent infection.  Monitor closely without antibiotics.  Repeat labs in AM.  History of HIV: Continue home medications.  Patient is followed by infectious disease as an outpatient.  History of PE: Continue Eliquis  Chronic hypoxia with respiratory failure: Oxygen saturation ranging from 80-90%.  Improved on 3 L supplemental oxygen.  Will titrate to 2 L in a.m.   DVT Prophylaxis: Eliquis and SCDs  AM Labs Ordered, also please review Full Orders  Family Communication: Admission, patient's condition and plan of care  including tests being ordered have been discussed with the patient who indicate understanding and agree with the plan and Code Status.  Code Status: Full Code  Consults called: None    Admission status: Inpatient    Time spent in minutes : 30 minutes Nolon NationsLachina Moore Marshal Eskew  APRN, [name], [name] [facility] [address]  [place], [place] [id number] [phone]  06/24/2021 at 2:55 PM

## 2021-06-24 NOTE — Plan of Care (Signed)
?  Problem: Education: ?Goal: Knowledge of General Education information will improve ?Description: Including pain rating scale, medication(s)/side effects and non-pharmacologic comfort measures ?Outcome: Progressing ?  ?Problem: Health Behavior/Discharge Planning: ?Goal: Ability to manage health-related needs will improve ?Outcome: Progressing ?  ?Problem: Clinical Measurements: ?Goal: Respiratory complications will improve ?Outcome: Progressing ?  ?Problem: Activity: ?Goal: Risk for activity intolerance will decrease ?Outcome: Progressing ?  ?Problem: Coping: ?Goal: Level of anxiety will decrease ?Outcome: Progressing ?  ?Problem: Pain Managment: ?Goal: General experience of comfort will improve ?Outcome: Progressing ?  ?Problem: Safety: ?Goal: Ability to remain free from injury will improve ?Outcome: Progressing ?  ?Problem: Education: ?Goal: Knowledge of vaso-occlusive preventative measures will improve ?Outcome: Progressing ?Goal: Awareness of infection prevention will improve ?Outcome: Progressing ?  ?

## 2021-06-24 NOTE — Telephone Encounter (Signed)
Patient called in. Complains of generalized pain rates 10/10. Denied chest pain, abd pain, fever, N/V/D, or priapism.  Wants to come in for treatment. Last took Percocet at 7 :00am. Pt denies recent visits to ED. Pt states his mother is his transportation. Pt denies any exposure to anyone covid positive in the last two weeks, denies flu like symptoms today. [place] [name] notified, approved for pt to be seen in the day hospital. Pt made aware, verbalized understanding.   ?

## 2021-06-25 DIAGNOSIS — E871 Hypo-osmolality and hyponatremia: Secondary | ICD-10-CM | POA: Diagnosis present

## 2021-06-25 DIAGNOSIS — B20 Human immunodeficiency virus [HIV] disease: Secondary | ICD-10-CM | POA: Diagnosis present

## 2021-06-25 DIAGNOSIS — D5700 Hb-SS disease with crisis, unspecified: Secondary | ICD-10-CM | POA: Diagnosis present

## 2021-06-25 DIAGNOSIS — K5900 Constipation, unspecified: Secondary | ICD-10-CM | POA: Diagnosis present

## 2021-06-25 DIAGNOSIS — D72829 Elevated white blood cell count, unspecified: Secondary | ICD-10-CM | POA: Diagnosis present

## 2021-06-25 DIAGNOSIS — Z7901 Long term (current) use of anticoagulants: Secondary | ICD-10-CM | POA: Diagnosis not present

## 2021-06-25 DIAGNOSIS — R636 Underweight: Secondary | ICD-10-CM | POA: Diagnosis present

## 2021-06-25 DIAGNOSIS — F411 Generalized anxiety disorder: Secondary | ICD-10-CM | POA: Diagnosis present

## 2021-06-25 DIAGNOSIS — F1120 Opioid dependence, uncomplicated: Secondary | ICD-10-CM | POA: Diagnosis present

## 2021-06-25 DIAGNOSIS — Z832 Family history of diseases of the blood and blood-forming organs and certain disorders involving the immune mechanism: Secondary | ICD-10-CM | POA: Diagnosis not present

## 2021-06-25 DIAGNOSIS — J9611 Chronic respiratory failure with hypoxia: Secondary | ICD-10-CM | POA: Diagnosis present

## 2021-06-25 DIAGNOSIS — F419 Anxiety disorder, unspecified: Secondary | ICD-10-CM | POA: Diagnosis present

## 2021-06-25 DIAGNOSIS — Z681 Body mass index (BMI) 19 or less, adult: Secondary | ICD-10-CM | POA: Diagnosis not present

## 2021-06-25 DIAGNOSIS — Z888 Allergy status to other drugs, medicaments and biological substances status: Secondary | ICD-10-CM | POA: Diagnosis not present

## 2021-06-25 DIAGNOSIS — G894 Chronic pain syndrome: Secondary | ICD-10-CM | POA: Diagnosis present

## 2021-06-25 DIAGNOSIS — Z20822 Contact with and (suspected) exposure to covid-19: Secondary | ICD-10-CM | POA: Diagnosis present

## 2021-06-25 DIAGNOSIS — Z79890 Hormone replacement therapy (postmenopausal): Secondary | ICD-10-CM | POA: Diagnosis not present

## 2021-06-25 DIAGNOSIS — Z885 Allergy status to narcotic agent status: Secondary | ICD-10-CM | POA: Diagnosis not present

## 2021-06-25 DIAGNOSIS — Z86711 Personal history of pulmonary embolism: Secondary | ICD-10-CM | POA: Diagnosis not present

## 2021-06-25 DIAGNOSIS — Z79899 Other long term (current) drug therapy: Secondary | ICD-10-CM | POA: Diagnosis not present

## 2021-06-25 LAB — BASIC METABOLIC PANEL
Anion gap: 5 (ref 5–15)
BUN: 9 mg/dL (ref 6–20)
CO2: 26 mmol/L (ref 22–32)
Calcium: 8.3 mg/dL — ABNORMAL LOW (ref 8.9–10.3)
Chloride: 101 mmol/L (ref 98–111)
Creatinine, Ser: 0.69 mg/dL (ref 0.61–1.24)
GFR, Estimated: >60 mL/min (ref >60)
Glucose, Bld: 93 mg/dL (ref 70–99)
Potassium: 4 mmol/L (ref 3.5–5.1)
Sodium: 132 mmol/L — ABNORMAL LOW (ref 135–145)

## 2021-06-25 LAB — CBC
HCT: 20.4 % — ABNORMAL LOW (ref 39.0–52.0)
Hemoglobin: 7.6 g/dL — ABNORMAL LOW (ref 13.0–17.0)
MCH: 35.7 pg — ABNORMAL HIGH (ref 26.0–34.0)
MCHC: 37.3 g/dL — ABNORMAL HIGH (ref 30.0–36.0)
MCV: 95.8 fL (ref 80.0–100.0)
Platelets: 284 10*3/uL (ref 150–400)
RBC: 2.13 MIL/uL — ABNORMAL LOW (ref 4.22–5.81)
RDW: 22.8 % — ABNORMAL HIGH (ref 11.5–15.5)
WBC: 11.7 10*3/uL — ABNORMAL HIGH (ref 4.0–10.5)
nRBC: 11.9 % — ABNORMAL HIGH (ref 0.0–0.2)

## 2021-06-25 NOTE — Progress Notes (Signed)
Subjective: Martin [name] is a 31 year old male with a medical history significant for sickle cell disease, chronic pain syndrome, opiate dependence and tolerance, history of anemia of chronic disease, history of PE on Eliquis, generalized anxiety, and HIV disease was admitted for sickle cell pain crisis.  Today, patient is complaining of increased pain primarily to low back and lower extremities.  He rates pain intensity is 8/10.  He denies any chest pain, shortness of breath, urinary symptoms, nausea, vomiting, or diarrhea.  He does endorse left-sided abdominal discomfort.  He reports some constipation.  Objective:  Vital signs in last 24 hours:  Vitals:   06/25/21 0420 06/25/21 0818 06/25/21 1006 06/25/21 1148  BP:   106/67   Pulse:   74   Resp: 14 16 14    Temp:   97.6 F (36.4 C)   TempSrc:   Oral   SpO2: 92% 95% 95% (!) 89%  Weight:      Height:        Intake/Output from previous day:   Intake/Output Summary (Last 24 hours) at 06/25/2021 1234 Last data filed at 06/25/2021 1059 Gross per 24 hour  Intake 2240.92 ml  Output 1600 ml  Net 640.92 ml    Physical Exam: General: Alert, awake, oriented x3, in no acute distress.  HEENT: [place]/AT PEERL, EOMI Neck: Trachea midline,  no masses, no thyromegal,y no JVD, no carotid bruit OROPHARYNX:  Moist, No exudate/ erythema/lesions.  Heart: Regular rate and rhythm, without murmurs, rubs, gallops, PMI non-displaced, no heaves or thrills on palpation.  Lungs: Clear to auscultation, no wheezing or rhonchi noted. No increased vocal fremitus resonant to percussion  Abdomen: Soft, nontender, nondistended, positive bowel sounds, no masses no hepatosplenomegaly noted..  Neuro: No focal neurological deficits noted cranial nerves II through XII grossly intact. DTRs 2+ bilaterally upper and lower extremities. Strength 5 out of 5 in bilateral upper and lower extremities. Musculoskeletal: No warm swelling or erythema around joints, no spinal  tenderness noted. Psychiatric: Patient alert and oriented x3, good insight and cognition, good recent to remote recall. Lymph node survey: No cervical axillary or inguinal lymphadenopathy noted.  Lab Results:  Basic Metabolic Panel:    Component Value Date/Time   NA 132 (L) 06/25/2021 0459   NA 136 03/18/2021 1232   K 4.0 06/25/2021 0459   CL 101 06/25/2021 0459   CO2 26 06/25/2021 0459   BUN 9 06/25/2021 0459   BUN 11 03/18/2021 1232   CREATININE 0.69 06/25/2021 0459   GLUCOSE 93 06/25/2021 0459   CALCIUM 8.3 (L) 06/25/2021 0459   CBC:    Component Value Date/Time   WBC 11.7 (H) 06/25/2021 0459   HGB 7.6 (L) 06/25/2021 0459   HGB 8.3 (L) 03/18/2021 1232   HCT 20.4 (L) 06/25/2021 0459   HCT 22.8 (L) 03/18/2021 1232   PLT 284 06/25/2021 0459   PLT 361 03/18/2021 1232   MCV 95.8 06/25/2021 0459   MCV 111 (H) 03/18/2021 1232   NEUTROABS 9.0 (H) 06/24/2021 1115   NEUTROABS 6.3 03/18/2021 1232   LYMPHSABS 4.0 06/24/2021 1115   LYMPHSABS 6.1 (H) 03/18/2021 1232   MONOABS 1.4 (H) 06/24/2021 1115   EOSABS 0.1 06/24/2021 1115   EOSABS 0.2 03/18/2021 1232   BASOSABS 0.0 06/24/2021 1115   BASOSABS 0.1 03/18/2021 1232    Recent Results (from the past 240 hour(s))  Resp Panel by RT-PCR (Flu A&B, Covid) Nasopharyngeal Swab     Status: None   Collection Time: 06/24/21  4:12 PM  Specimen: Nasopharyngeal Swab; Nasopharyngeal(NP) swabs in vial transport medium  Result Value Ref Range Status   SARS Coronavirus 2 by RT PCR NEGATIVE NEGATIVE Final    Comment: (NOTE) SARS-CoV-2 target nucleic acids are NOT DETECTED.  The SARS-CoV-2 RNA is generally detectable in upper respiratory specimens during the acute phase of infection. The lowest concentration of SARS-CoV-2 viral copies this assay can detect is 138 copies/mL. A negative result does not preclude SARS-Cov-2 infection and should not be used as the sole basis for treatment or other patient management decisions. A negative  result may occur with  improper specimen collection/handling, submission of specimen other than nasopharyngeal swab, presence of viral mutation(s) within the areas targeted by this assay, and inadequate number of viral copies(<138 copies/mL). A negative result must be combined with clinical observations, patient history, and epidemiological information. The expected result is Negative.  Fact Sheet for Patients:  BloggerCourse.com  Fact Sheet for Healthcare Providers:  SeriousBroker.it  This test is no t yet approved or cleared by the [place] FDA and  has been authorized for detection and/or diagnosis of SARS-CoV-2 by FDA under an Emergency Use Authorization (EUA). This EUA will remain  in effect (meaning this test can be used) for the duration of the COVID-19 declaration under Section 564(b)(1) of the Act, 21 U.S.C.section 360bbb-3(b)(1), unless the authorization is terminated  or revoked sooner.       Influenza A by PCR NEGATIVE NEGATIVE Final   Influenza B by PCR NEGATIVE NEGATIVE Final    Comment: (NOTE) The Xpert Xpress SARS-CoV-2/FLU/RSV plus assay is intended as an aid in the diagnosis of influenza from Nasopharyngeal swab specimens and should not be used as a sole basis for treatment. Nasal washings and aspirates are unacceptable for Xpert Xpress SARS-CoV-2/FLU/RSV testing.  Fact Sheet for Patients: BloggerCourse.com  Fact Sheet for Healthcare Providers: SeriousBroker.it  This test is not yet approved or cleared by the [place] FDA and has been authorized for detection and/or diagnosis of SARS-CoV-2 by FDA under an Emergency Use Authorization (EUA). This EUA will remain in effect (meaning this test can be used) for the duration of the COVID-19 declaration under Section 564(b)(1) of the Act, 21 U.S.C. section 360bbb-3(b)(1), unless the authorization is  terminated or revoked.  Performed at [facility], [address], [place], [place] [address]     Studies/Results: No results found.  Medications: Scheduled Meds:  abacavir-dolutegravir-lamiVUDine  1 tablet Oral QHS   apixaban  5 mg Oral BID   Chlorhexidine Gluconate Cloth  6 each Topical Daily   HYDROmorphone   Intravenous Q4H   hydroxyurea  2,000 mg Oral QHS   loratadine  10 mg Oral Daily   multivitamin with minerals  1 tablet Oral Q breakfast   senna-docusate  1 tablet Oral BID   Continuous Infusions:  sodium chloride 100 mL/hr at 06/25/21 1021   PRN Meds:.ALPRAZolam, cyclobenzaprine, diphenhydrAMINE, heparin lock flush, melatonin, naloxone **AND** sodium chloride flush, ondansetron (ZOFRAN) IV, polyethylene glycol, sodium chloride flush  Consultants: None  Procedures: None  Antibiotics: None  Assessment/Plan: Principal Problem:   Sickle cell pain crisis (HCC) Active Problems:   Generalized anxiety disorder   Human immunodeficiency virus (HIV) disease (HCC)   History of pulmonary embolus (PE)   Hypoxia   Leukocytosis   Sickle cell crisis (HCC)  Sickle cell disease with pain crisis: Continue IV Dilaudid PCA without any changes in settings Hold Toradol, listed allergy Continue IV fluids, 0.45% saline at 100 mL/h Supplemental oxygen at 3  L Monitor vital signs very closely, reevaluate pain scale regularly, and continue supplemental oxygen.  Chronic pain syndrome: Continue home medications  Anemia of chronic disease: Hemoglobin is stable and consistent with his baseline.  There is no clinical indication for blood transfusion at this time.  Monitor closely.  Leukocytosis: Stable.  Improving.  Patient remains afebrile without any apparent infection.  Monitor closely without antibiotics.  Labs in AM.  History of HIV: Continue home medications.  He is followed by infectious disease as an outpatient.  History of PE: Continue  Eliquis  Chronic hypoxemic respiratory failure: Stable.  Continue supplemental oxygen at 3 L.  Titrate as tolerated.  Code Status: Full Code Family Communication: N/A Disposition Plan: Not yet ready for discharge  Kjirsten Bloodgood Rennis Petty  APRN, [name], [name] Patient Care Center [facility] [address] [place], [place] [address] [phone]  If 5PM-8AM, please contact night-coverage.  06/25/2021, 12:34 PM  LOS: 0 days

## 2021-06-26 ENCOUNTER — Inpatient Hospital Stay (HOSPITAL_COMMUNITY): Payer: Medicaid Other

## 2021-06-26 DIAGNOSIS — D5700 Hb-SS disease with crisis, unspecified: Secondary | ICD-10-CM | POA: Diagnosis not present

## 2021-06-26 MED ORDER — OXYCODONE HCL 5 MG PO TABS
5.0000 mg | ORAL_TABLET | ORAL | Status: DC | PRN
  Administered 2021-06-26 – 2021-06-30 (×17): 5 mg via ORAL
  Filled 2021-06-26 (×17): qty 1

## 2021-06-26 MED ORDER — OXYCODONE-ACETAMINOPHEN 5-325 MG PO TABS
1.0000 | ORAL_TABLET | ORAL | Status: DC | PRN
  Administered 2021-06-26 – 2021-06-30 (×17): 1 via ORAL
  Filled 2021-06-26 (×17): qty 1

## 2021-06-26 NOTE — Progress Notes (Addendum)
Subjective: Martin [name] is a 31 year old male with a medical history significant for sickle cell disease, chronic pain syndrome, opiate dependence and tolerance, history of anemia of chronic disease, history of PE on Eliquis, generalized anxiety, and HIV disease was admitted for sickle cell pain crisis.  Today, patient is complaining of increased pain primarily to low back and lower extremities.  He rates pain intensity is 6/10.  He denies any chest pain, shortness of breath, urinary symptoms, nausea, vomiting, or diarrhea.  He continues to endorse left-sided abdominal discomfort.  He reports some constipation, he has not had a bowel movement in several days.   Objective:  Vital signs in last 24 hours:  Vitals:   06/26/21 0307 06/26/21 0540 06/26/21 0800 06/26/21 1035  BP:  (!) 98/58 103/65 103/67  Pulse:  91 85 84  Resp: 16 15 20 15   Temp:  98.8 F (37.1 C) 98.7 F (37.1 C) 98.5 F (36.9 C)  TempSrc:  Oral Oral Oral  SpO2: 91% (!) 89% 93% 91%  Weight:      Height:        Intake/Output from previous day:   Intake/Output Summary (Last 24 hours) at 06/26/2021 1147 Last data filed at 06/26/2021 1042 Gross per 24 hour  Intake 3563.28 ml  Output 3900 ml  Net -336.72 ml    Physical Exam: General: Alert, awake, oriented x3, in no acute distress.  HEENT: [place]/AT PEERL, EOMI Neck: Trachea midline,  no masses, no thyromegal,y no JVD, no carotid bruit OROPHARYNX:  Moist, No exudate/ erythema/lesions.  Heart: Regular rate and rhythm, without murmurs, rubs, gallops, PMI non-displaced, no heaves or thrills on palpation.  Lungs: Clear to auscultation, no wheezing or rhonchi noted. No increased vocal fremitus resonant to percussion  Abdomen: Soft, nontender, nondistended, positive bowel sounds, no masses no hepatosplenomegaly noted..  Neuro: No focal neurological deficits noted cranial nerves II through XII grossly intact. DTRs 2+ bilaterally upper and lower extremities. Strength 5 out of  5 in bilateral upper and lower extremities. Musculoskeletal: No warm swelling or erythema around joints, no spinal tenderness noted. Psychiatric: Patient alert and oriented x3, good insight and cognition, good recent to remote recall. Lymph node survey: No cervical axillary or inguinal lymphadenopathy noted.  Lab Results:  Basic Metabolic Panel:    Component Value Date/Time   NA 132 (L) 06/25/2021 0459   NA 136 03/18/2021 1232   K 4.0 06/25/2021 0459   CL 101 06/25/2021 0459   CO2 26 06/25/2021 0459   BUN 9 06/25/2021 0459   BUN 11 03/18/2021 1232   CREATININE 0.69 06/25/2021 0459   GLUCOSE 93 06/25/2021 0459   CALCIUM 8.3 (L) 06/25/2021 0459   CBC:    Component Value Date/Time   WBC 11.7 (H) 06/25/2021 0459   HGB 7.6 (L) 06/25/2021 0459   HGB 8.3 (L) 03/18/2021 1232   HCT 20.4 (L) 06/25/2021 0459   HCT 22.8 (L) 03/18/2021 1232   PLT 284 06/25/2021 0459   PLT 361 03/18/2021 1232   MCV 95.8 06/25/2021 0459   MCV 111 (H) 03/18/2021 1232   NEUTROABS 9.0 (H) 06/24/2021 1115   NEUTROABS 6.3 03/18/2021 1232   LYMPHSABS 4.0 06/24/2021 1115   LYMPHSABS 6.1 (H) 03/18/2021 1232   MONOABS 1.4 (H) 06/24/2021 1115   EOSABS 0.1 06/24/2021 1115   EOSABS 0.2 03/18/2021 1232   BASOSABS 0.0 06/24/2021 1115   BASOSABS 0.1 03/18/2021 1232    Recent Results (from the past 240 hour(s))  Resp Panel by RT-PCR (Flu A&B,  Covid) Nasopharyngeal Swab     Status: None   Collection Time: 06/24/21  4:12 PM   Specimen: Nasopharyngeal Swab; Nasopharyngeal(NP) swabs in vial transport medium  Result Value Ref Range Status   SARS Coronavirus 2 by RT PCR NEGATIVE NEGATIVE Final    Comment: (NOTE) SARS-CoV-2 target nucleic acids are NOT DETECTED.  The SARS-CoV-2 RNA is generally detectable in upper respiratory specimens during the acute phase of infection. The lowest concentration of SARS-CoV-2 viral copies this assay can detect is 138 copies/mL. A negative result does not preclude  SARS-Cov-2 infection and should not be used as the sole basis for treatment or other patient management decisions. A negative result may occur with  improper specimen collection/handling, submission of specimen other than nasopharyngeal swab, presence of viral mutation(s) within the areas targeted by this assay, and inadequate number of viral copies(<138 copies/mL). A negative result must be combined with clinical observations, patient history, and epidemiological information. The expected result is Negative.  Fact Sheet for Patients:  BloggerCourse.com  Fact Sheet for Healthcare Providers:  SeriousBroker.it  This test is no t yet approved or cleared by the [place] FDA and  has been authorized for detection and/or diagnosis of SARS-CoV-2 by FDA under an Emergency Use Authorization (EUA). This EUA will remain  in effect (meaning this test can be used) for the duration of the COVID-19 declaration under Section 564(b)(1) of the Act, 21 U.S.C.section 360bbb-3(b)(1), unless the authorization is terminated  or revoked sooner.       Influenza A by PCR NEGATIVE NEGATIVE Final   Influenza B by PCR NEGATIVE NEGATIVE Final    Comment: (NOTE) The Xpert Xpress SARS-CoV-2/FLU/RSV plus assay is intended as an aid in the diagnosis of influenza from Nasopharyngeal swab specimens and should not be used as a sole basis for treatment. Nasal washings and aspirates are unacceptable for Xpert Xpress SARS-CoV-2/FLU/RSV testing.  Fact Sheet for Patients: BloggerCourse.com  Fact Sheet for Healthcare Providers: SeriousBroker.it  This test is not yet approved or cleared by the [place] FDA and has been authorized for detection and/or diagnosis of SARS-CoV-2 by FDA under an Emergency Use Authorization (EUA). This EUA will remain in effect (meaning this test can be used) for the duration of  the COVID-19 declaration under Section 564(b)(1) of the Act, 21 U.S.C. section 360bbb-3(b)(1), unless the authorization is terminated or revoked.  Performed at [facility], [address], [place], [place] [address]     Studies/Results: No results found.  Medications: Scheduled Meds:  abacavir-dolutegravir-lamiVUDine  1 tablet Oral QHS   apixaban  5 mg Oral BID   Chlorhexidine Gluconate Cloth  6 each Topical Daily   HYDROmorphone   Intravenous Q4H   hydroxyurea  2,000 mg Oral QHS   loratadine  10 mg Oral Daily   multivitamin with minerals  1 tablet Oral Q breakfast   senna-docusate  1 tablet Oral BID   Continuous Infusions:  sodium chloride 100 mL/hr at 06/26/21 1000   PRN Meds:.ALPRAZolam, cyclobenzaprine, diphenhydrAMINE, heparin lock flush, melatonin, naloxone **AND** sodium chloride flush, ondansetron (ZOFRAN) IV, oxyCODONE-acetaminophen **AND** oxyCODONE, polyethylene glycol, sodium chloride flush  Consultants: None  Procedures: None  Antibiotics: None  Assessment/Plan: Principal Problem:   Sickle cell pain crisis (HCC) Active Problems:   Generalized anxiety disorder   Human immunodeficiency virus (HIV) disease (HCC)   History of pulmonary embolus (PE)   Hypoxia   Leukocytosis   Sickle cell crisis (HCC)  Sickle cell disease with pain crisis: Continue IV Dilaudid  PCA without any changes in settings Hold Toradol, listed allergy Continue IV fluids, 0.45% saline at 100 mL/h Supplemental oxygen at 3 L Monitor vital signs very closely, reevaluate pain scale regularly, and continue supplemental oxygen.  Chronic pain syndrome: Continue home medications  Anemia of chronic disease: Hemoglobin is stable and consistent with his baseline.  There is no clinical indication for blood transfusion at this time.  Monitor closely.  Leukocytosis: Stable.  Improving.  Patient remains afebrile without any apparent infection.  Monitor closely without  antibiotics.  Labs in AM.  History of HIV: Continue home medications.  He is followed by infectious disease as an outpatient.  History of PE: Continue Eliquis  Chronic hypoxemic respiratory failure: Stable.  Continue supplemental oxygen at 3 L.  Titrate as tolerated.  LUQ abdominal pain:  Patient is complaining of LUQ abdominal discomfort. He has not had a bowel movement over the past several days. Continue bowel regimen. Review KUB as results become available .   Code Status: Full Code Family Communication: N/A Disposition Plan: Not yet ready for discharge  Celicia Minahan Rennis PettyMoore Ada Woodbury  APRN, [name], [name] [facility] [address]  [place], [place] [id number] [phone]  If 5PM-8AM, please contact night-coverage.  06/26/2021, 11:47 AM  LOS: 1 day

## 2021-06-26 NOTE — Progress Notes (Signed)
Upon clearing PCA history this person witnessed an episode of epistaxis. Pt able to get it under control fairly quick. No needs at this time. ?

## 2021-06-27 DIAGNOSIS — D5700 Hb-SS disease with crisis, unspecified: Secondary | ICD-10-CM | POA: Diagnosis not present

## 2021-06-27 LAB — CBC
HCT: 23.4 % — ABNORMAL LOW (ref 39.0–52.0)
Hemoglobin: 9.3 g/dL — ABNORMAL LOW (ref 13.0–17.0)
MCH: 35.5 pg — ABNORMAL HIGH (ref 26.0–34.0)
MCHC: 39.7 g/dL — ABNORMAL HIGH (ref 30.0–36.0)
MCV: 89.3 fL (ref 80.0–100.0)
Platelets: 314 10*3/uL (ref 150–400)
RBC: 2.62 MIL/uL — ABNORMAL LOW (ref 4.22–5.81)
RDW: 21.9 % — ABNORMAL HIGH (ref 11.5–15.5)
WBC: 10.3 10*3/uL (ref 4.0–10.5)
nRBC: 0.9 % — ABNORMAL HIGH (ref 0.0–0.2)

## 2021-06-27 LAB — BASIC METABOLIC PANEL
Anion gap: 5 (ref 5–15)
BUN: 16 mg/dL (ref 6–20)
CO2: 26 mmol/L (ref 22–32)
Calcium: 8.7 mg/dL — ABNORMAL LOW (ref 8.9–10.3)
Chloride: 103 mmol/L (ref 98–111)
Creatinine, Ser: 0.81 mg/dL (ref 0.61–1.24)
GFR, Estimated: >60 mL/min (ref >60)
Glucose, Bld: 95 mg/dL (ref 70–99)
Potassium: 4.3 mmol/L (ref 3.5–5.1)
Sodium: 134 mmol/L — ABNORMAL LOW (ref 135–145)

## 2021-06-27 MED ORDER — HYDROMORPHONE 1 MG/ML IV SOLN
INTRAVENOUS | Status: DC
  Administered 2021-06-27: 6 mg via INTRAVENOUS
  Administered 2021-06-27: 5.5 mg via INTRAVENOUS
  Administered 2021-06-27: 30 mg via INTRAVENOUS
  Administered 2021-06-27: 5.5 mg via INTRAVENOUS
  Administered 2021-06-28: 3.5 mg via INTRAVENOUS
  Administered 2021-06-28: 5.5 mg via INTRAVENOUS
  Administered 2021-06-28: 7 mg via INTRAVENOUS
  Administered 2021-06-28: 6 mg via INTRAVENOUS
  Administered 2021-06-28: 3 mg via INTRAVENOUS
  Administered 2021-06-29: 3.5 mg via INTRAVENOUS
  Administered 2021-06-29: 5.5 mg via INTRAVENOUS
  Administered 2021-06-29: 4 mg via INTRAVENOUS
  Administered 2021-06-29: 3.5 mg via INTRAVENOUS
  Administered 2021-06-29: 1 mg via INTRAVENOUS
  Administered 2021-06-29: 6 mg via INTRAVENOUS
  Administered 2021-06-30: 7 mg via INTRAVENOUS
  Administered 2021-06-30: 5.5 mg via INTRAVENOUS
  Filled 2021-06-27 (×3): qty 30

## 2021-06-27 MED ORDER — LACTULOSE 10 GM/15ML PO SOLN
30.0000 g | Freq: Two times a day (BID) | ORAL | Status: AC
  Administered 2021-06-27: 30 g via ORAL
  Filled 2021-06-27 (×3): qty 45

## 2021-06-27 MED ORDER — LIP MEDEX EX OINT
TOPICAL_OINTMENT | CUTANEOUS | Status: DC | PRN
  Administered 2021-06-27: 75 via TOPICAL
  Filled 2021-06-27: qty 7

## 2021-06-27 NOTE — H&P (Signed)
Subjective: Martin [name] is a 31 year old male with a medical history significant for sickle cell disease, chronic pain syndrome, opiate dependence and tolerance, history of anemia of chronic disease, history of PE on Eliquis, generalized anxiety, and HIV disease was admitted for sickle cell pain crisis.  Patient has no new complaints on today.  He rates pain intensity is 6/10.  He denies any chest pain, shortness of breath, urinary symptoms, nausea, vomiting, or diarrhea.  He continues to endorse left-sided abdominal discomfort.  He reports some constipation, he has not had a bowel movement over the past several days.  Objective:  Vital signs in last 24 hours:  Vitals:   06/27/21 1152 06/27/21 1200 06/27/21 1324 06/27/21 1611  BP: 107/65     Pulse: 100     Resp: 17 20 16 16   Temp:      TempSrc:      SpO2: 95% 95% 90% 90%  Weight:      Height:        Intake/Output from previous day:   Intake/Output Summary (Last 24 hours) at 06/27/2021 1647 Last data filed at 06/27/2021 1600 Gross per 24 hour  Intake 1080 ml  Output 1650 ml  Net -570 ml    Physical Exam: General: Alert, awake, oriented x3, in no acute distress.  HEENT: [place]/AT PEERL, EOMI Neck: Trachea midline,  no masses, no thyromegal,y no JVD, no carotid bruit OROPHARYNX:  Moist, No exudate/ erythema/lesions.  Heart: Regular rate and rhythm, without murmurs, rubs, gallops, PMI non-displaced, no heaves or thrills on palpation.  Lungs: Clear to auscultation, no wheezing or rhonchi noted. No increased vocal fremitus resonant to percussion  Abdomen: Soft, nontender, nondistended, positive bowel sounds, no masses no hepatosplenomegaly noted..  Neuro: No focal neurological deficits noted cranial nerves II through XII grossly intact. DTRs 2+ bilaterally upper and lower extremities. Strength 5 out of 5 in bilateral upper and lower extremities. Musculoskeletal: No warm swelling or erythema around joints, no spinal tenderness  noted. Psychiatric: Patient alert and oriented x3, good insight and cognition, good recent to remote recall. Lymph node survey: No cervical axillary or inguinal lymphadenopathy noted.  Lab Results:  Basic Metabolic Panel:    Component Value Date/Time   NA 134 (L) 06/27/2021 1027   NA 136 03/18/2021 1232   K 4.3 06/27/2021 1027   CL 103 06/27/2021 1027   CO2 26 06/27/2021 1027   BUN 16 06/27/2021 1027   BUN 11 03/18/2021 1232   CREATININE 0.81 06/27/2021 1027   GLUCOSE 95 06/27/2021 1027   CALCIUM 8.7 (L) 06/27/2021 1027   CBC:    Component Value Date/Time   WBC 10.3 06/27/2021 1027   HGB 9.3 (L) 06/27/2021 1027   HGB 8.3 (L) 03/18/2021 1232   HCT 23.4 (L) 06/27/2021 1027   HCT 22.8 (L) 03/18/2021 1232   PLT 314 06/27/2021 1027   PLT 361 03/18/2021 1232   MCV 89.3 06/27/2021 1027   MCV 111 (H) 03/18/2021 1232   NEUTROABS 9.0 (H) 06/24/2021 1115   NEUTROABS 6.3 03/18/2021 1232   LYMPHSABS 4.0 06/24/2021 1115   LYMPHSABS 6.1 (H) 03/18/2021 1232   MONOABS 1.4 (H) 06/24/2021 1115   EOSABS 0.1 06/24/2021 1115   EOSABS 0.2 03/18/2021 1232   BASOSABS 0.0 06/24/2021 1115   BASOSABS 0.1 03/18/2021 1232    Recent Results (from the past 240 hour(s))  Resp Panel by RT-PCR (Flu A&B, Covid) Nasopharyngeal Swab     Status: None   Collection Time: 06/24/21  4:12 PM  Specimen: Nasopharyngeal Swab; Nasopharyngeal(NP) swabs in vial transport medium  Result Value Ref Range Status   SARS Coronavirus 2 by RT PCR NEGATIVE NEGATIVE Final    Comment: (NOTE) SARS-CoV-2 target nucleic acids are NOT DETECTED.  The SARS-CoV-2 RNA is generally detectable in upper respiratory specimens during the acute phase of infection. The lowest concentration of SARS-CoV-2 viral copies this assay can detect is 138 copies/mL. A negative result does not preclude SARS-Cov-2 infection and should not be used as the sole basis for treatment or other patient management decisions. A negative result may occur  with  improper specimen collection/handling, submission of specimen other than nasopharyngeal swab, presence of viral mutation(s) within the areas targeted by this assay, and inadequate number of viral copies(<138 copies/mL). A negative result must be combined with clinical observations, patient history, and epidemiological information. The expected result is Negative.  Fact Sheet for Patients:  BloggerCourse.com  Fact Sheet for Healthcare Providers:  SeriousBroker.it  This test is no t yet approved or cleared by the [place] FDA and  has been authorized for detection and/or diagnosis of SARS-CoV-2 by FDA under an Emergency Use Authorization (EUA). This EUA will remain  in effect (meaning this test can be used) for the duration of the COVID-19 declaration under Section 564(b)(1) of the Act, 21 U.S.C.section 360bbb-3(b)(1), unless the authorization is terminated  or revoked sooner.       Influenza A by PCR NEGATIVE NEGATIVE Final   Influenza B by PCR NEGATIVE NEGATIVE Final    Comment: (NOTE) The Xpert Xpress SARS-CoV-2/FLU/RSV plus assay is intended as an aid in the diagnosis of influenza from Nasopharyngeal swab specimens and should not be used as a sole basis for treatment. Nasal washings and aspirates are unacceptable for Xpert Xpress SARS-CoV-2/FLU/RSV testing.  Fact Sheet for Patients: BloggerCourse.com  Fact Sheet for Healthcare Providers: SeriousBroker.it  This test is not yet approved or cleared by the [place] FDA and has been authorized for detection and/or diagnosis of SARS-CoV-2 by FDA under an Emergency Use Authorization (EUA). This EUA will remain in effect (meaning this test can be used) for the duration of the COVID-19 declaration under Section 564(b)(1) of the Act, 21 U.S.C. section 360bbb-3(b)(1), unless the authorization is terminated  or revoked.  Performed at [facility], [address], [place], [place] [address]     Studies/Results: DG Abd 1 View  Result Date: 06/26/2021 CLINICAL DATA:  Abdominal pain EXAM: ABDOMEN - 1 VIEW COMPARISON:  None. FINDINGS: Bowel gas pattern is nonspecific. Moderate amount of stool is seen in colon without fecal impaction in the rectum. No abnormal masses or calcifications are seen. Kidneys are partly obscured by bowel contents. Surgical clips are seen in the right upper quadrant. Tip of central venous catheter is seen in the right atrium. IMPRESSION: Nonspecific bowel gas pattern. Electronically Signed   By: Ernie Avena M.D.   On: 06/26/2021 12:00    Medications: Scheduled Meds:  abacavir-dolutegravir-lamiVUDine  1 tablet Oral QHS   apixaban  5 mg Oral BID   Chlorhexidine Gluconate Cloth  6 each Topical Daily   HYDROmorphone   Intravenous Q4H   hydroxyurea  2,000 mg Oral QHS   lactulose  30 g Oral BID   loratadine  10 mg Oral Daily   multivitamin with minerals  1 tablet Oral Q breakfast   senna-docusate  1 tablet Oral BID   Continuous Infusions:  sodium chloride 10 mL/hr at 06/26/21 1300   PRN Meds:.ALPRAZolam, cyclobenzaprine, diphenhydrAMINE, heparin lock  flush, lip balm, melatonin, naloxone **AND** sodium chloride flush, ondansetron (ZOFRAN) IV, oxyCODONE-acetaminophen **AND** oxyCODONE, polyethylene glycol, sodium chloride flush  Consultants: None  Procedures: None  Antibiotics: None  Assessment/Plan: Principal Problem:   Sickle cell pain crisis (HCC) Active Problems:   Generalized anxiety disorder   Human immunodeficiency virus (HIV) disease (HCC)   History of pulmonary embolus (PE)   Hypoxia   Leukocytosis   Sickle cell crisis (HCC)  Sickle cell disease with pain crisis: Continue IV Dilaudid PCA, weaning Hold Toradol, listed allergy Continue IV fluids, 0.45% saline at 100 mL/h Supplemental oxygen at 3 L Monitor vital signs very  closely, reevaluate pain scale regularly, and continue supplemental oxygen.  Chronic pain syndrome: Continue home medications  Anemia of chronic disease: Hemoglobin is stable and consistent with his baseline.  There is no clinical indication for blood transfusion at this time.  Monitor closely.  Leukocytosis: Stable.  Improving.  Patient remains afebrile without any apparent infection.  Monitor closely without antibiotics.  Labs in AM.  History of HIV: Continue home medications.  He is followed by infectious disease as an outpatient.  History of PE: Continue Eliquis  Chronic hypoxemic respiratory failure: Stable.  Continue supplemental oxygen at 3 L.  Titrate as tolerated.  LUQ abdominal pain:  Patient is complaining of LUQ abdominal discomfort. He has not had a bowel movement over the past several days. KUB shows a moderate amount of stool. Lactulose today.   Code Status: Full Code Family Communication: N/A Disposition Plan: Not yet ready for discharge  Tineshia Becraft Rennis Petty  APRN, [name], [name] [facility] [address] [place], [place] [phone]  If 5PM-8AM, please contact night-coverage.  06/27/2021, 4:47 PM  LOS: 2 days

## 2021-06-28 MED ORDER — DIPHENHYDRAMINE HCL 50 MG/ML IJ SOLN
12.5000 mg | Freq: Once | INTRAMUSCULAR | Status: AC
  Administered 2021-06-28: 12.5 mg via INTRAVENOUS
  Filled 2021-06-28: qty 1

## 2021-06-28 NOTE — Progress Notes (Signed)
SICKLE CELL SERVICE PROGRESS NOTE  Martin BullaDemarquis X [name] JXB:[id number] DOB: 09/19/1990 DOA: 06/24/2021 PCP: Massie MaroonHollis, Lachina M, [name]  Assessment/Plan: Principal Problem:   Sickle cell pain crisis (HCC) Active Problems:   Generalized anxiety disorder   Human immunodeficiency virus (HIV) disease (HCC)   History of pulmonary embolus (PE)   Hypoxia   Leukocytosis   Sickle cell crisis (HCC)  Sickle cell pain crisis: Patient currently on Dilaudid PCA, oxycodone.  No Toradol.  Pain is still persistent.  We will continue to adjust medications and reassess his pain.  His renal function is better so I will restart Toradol. HIV disease: Continue home regimen. Chronic pain syndrome: Continue home regimen. History of PE: Continue apixaban Generalized anxiety disorder: Continue home regimen. Hyponatremia: Continue to hydrate.  Code Status: Full code Family Communication: No family at bedside Disposition Plan: Home  [facility]  Pager [phone]. If 7PM-7AM, please contact night-coverage.  06/28/2021, 7:20 AM  LOS: 3 days   Brief narrative: Patient is still having 9 out of 10 pain.  Denies any fever or chills.  Denied any nausea vomiting or diarrhea.  He is overall improving.  Consultants: None  Procedures: Acute abdominal series  Antibiotics: None  HPI/Subjective: Martin [name] is a 31 year old male with a medical history significant for sickle cell disease, chronic pain syndrome, opiate dependence and tolerance, history of anemia of chronic disease, history of PE on Eliquis, generalized anxiety, and HIV disease was admitted for sickle cell pain crisis.  Patient has no new complaints on today.  He rates pain intensity is 6/10.  He denies any chest pain, shortness of breath, urinary symptoms, nausea, vomiting, or diarrhea.  He continues to endorse left-sided abdominal discomfort.  He reports some constipation, he has not had a bowel movement over the past several  days.  Objective: Vitals:   06/28/21 0029 06/28/21 0414 06/28/21 0424 06/28/21 0628  BP: 111/85 103/60  108/70  Pulse: (!) 110 (!) 116  (!) 105  Resp: 17 17 17 16   Temp: 97.7 F (36.5 C) 99 F (37.2 C)  99 F (37.2 C)  TempSrc: Oral Oral  Oral  SpO2: (!) 87% 90% 90% 91%  Weight:      Height:       Weight change:   Intake/Output Summary (Last 24 hours) at 06/28/2021 0720 Last data filed at 06/28/2021 0500 Gross per 24 hour  Intake 480 ml  Output 1800 ml  Net -1320 ml    General: Alert, awake, oriented x3, in no acute distress.  HEENT: [place]/AT PEERL, EOMI Neck: Trachea midline,  no masses, no thyromegal,y no JVD, no carotid bruit OROPHARYNX:  Moist, No exudate/ erythema/lesions.  Heart: Sinus tachycardia, without murmurs, rubs, gallops, PMI non-displaced, no heaves or thrills on palpation.  Lungs: Clear to auscultation, no wheezing or rhonchi noted. No increased vocal fremitus resonant to percussion  Abdomen: Soft, nontender, nondistended, positive bowel sounds, no masses no hepatosplenomegaly noted..  Neuro: No focal neurological deficits noted cranial nerves II through XII grossly intact. DTRs 2+ bilaterally upper and lower extremities. Strength 5 out of 5 in bilateral upper and lower extremities. Musculoskeletal: No warm swelling or erythema around joints, no spinal tenderness noted. Psychiatric: Patient alert and oriented x3, good insight and cognition, good recent to remote recall. Lymph node survey: No cervical axillary or inguinal lymphadenopathy noted.   Data Reviewed: Basic Metabolic Panel: Recent Labs  Lab 06/24/21 1115 06/25/21 0459 06/27/21 1027  NA 132* 132* 134*  K 4.6 4.0 4.3  CL 104  101 103  CO2 25 26 26   GLUCOSE 83 93 95  BUN 11 9 16   CREATININE 0.81 0.69 0.81  CALCIUM 8.2* 8.3* 8.7*   Liver Function Tests: Recent Labs  Lab 06/24/21 1115  AST 34  ALT 16  ALKPHOS 66  BILITOT 7.3*  PROT 8.5*  ALBUMIN 3.7   No results for input(s): LIPASE,  AMYLASE in the last 168 hours. No results for input(s): AMMONIA in the last 168 hours. CBC: Recent Labs  Lab 06/24/21 1115 06/25/21 0459 06/27/21 1027  WBC 14.6* 11.7* 10.3  NEUTROABS 9.0*  --   --   HGB 8.2* 7.6* 9.3*  HCT 21.8* 20.4* 23.4*  MCV 94.4 95.8 89.3  PLT 340 284 314   Cardiac Enzymes: No results for input(s): CKTOTAL, CKMB, CKMBINDEX, TROPONINI in the last 168 hours. BNP (last 3 results) Recent Labs    05/27/21 2216  BNP 91.4    ProBNP (last 3 results) No results for input(s): PROBNP in the last 8760 hours.  CBG: No results for input(s): GLUCAP in the last 168 hours.  Recent Results (from the past 240 hour(s))  Resp Panel by RT-PCR (Flu A&B, Covid) Nasopharyngeal Swab     Status: None   Collection Time: 06/24/21  4:12 PM   Specimen: Nasopharyngeal Swab; Nasopharyngeal(NP) swabs in vial transport medium  Result Value Ref Range Status   SARS Coronavirus 2 by RT PCR NEGATIVE NEGATIVE Final    Comment: (NOTE) SARS-CoV-2 target nucleic acids are NOT DETECTED.  The SARS-CoV-2 RNA is generally detectable in upper respiratory specimens during the acute phase of infection. The lowest concentration of SARS-CoV-2 viral copies this assay can detect is 138 copies/mL. A negative result does not preclude SARS-Cov-2 infection and should not be used as the sole basis for treatment or other patient management decisions. A negative result may occur with  improper specimen collection/handling, submission of specimen other than nasopharyngeal swab, presence of viral mutation(s) within the areas targeted by this assay, and inadequate number of viral copies(<138 copies/mL). A negative result must be combined with clinical observations, patient history, and epidemiological information. The expected result is Negative.  Fact Sheet for Patients:  2217  Fact Sheet for Healthcare Providers:  08/24/21  This  test is no t yet approved or cleared by the [employer] FDA and  has been authorized for detection and/or diagnosis of SARS-CoV-2 by FDA under an Emergency Use Authorization (EUA). This EUA will remain  in effect (meaning this test can be used) for the duration of the COVID-19 declaration under Section 564(b)(1) of the Act, 21 U.S.C.section 360bbb-3(b)(1), unless the authorization is terminated  or revoked sooner.       Influenza A by PCR NEGATIVE NEGATIVE Final   Influenza B by PCR NEGATIVE NEGATIVE Final    Comment: (NOTE) The Xpert Xpress SARS-CoV-2/FLU/RSV plus assay is intended as an aid in the diagnosis of influenza from Nasopharyngeal swab specimens and should not be used as a sole basis for treatment. Nasal washings and aspirates are unacceptable for Xpert Xpress SARS-CoV-2/FLU/RSV testing.  Fact Sheet for Patients: SeriousBroker.it  Fact Sheet for Healthcare Providers: [place]  This test is not yet approved or cleared by the [employer] FDA and has been authorized for detection and/or diagnosis of SARS-CoV-2 by FDA under an Emergency Use Authorization (EUA). This EUA will remain in effect (meaning this test can be used) for the duration of the COVID-19 declaration under Section 564(b)(1) of the Act, 21 U.S.C. section 360bbb-3(b)(1), unless the authorization  is terminated or revoked.  Performed at [facility], [address] [place], [place] [address]      Studies: DG Chest 2 View  Result Date: 06/06/2021 CLINICAL DATA:  Chest pain. EXAM: CHEST - 2 VIEW COMPARISON:  Chest radiograph dated 05/27/2021. FINDINGS: Right-sided Port-A-Cath in similar position. Background of emphysema and chronic interstitial coarsening. No focal consolidation, pleural effusion, pneumothorax. The cardiac silhouette is within normal limits. No acute osseous pathology. IMPRESSION: 1. No active cardiopulmonary  disease. 2. Emphysema. Electronically Signed   By: Elgie Collard M.D.   On: 06/06/2021 02:12   DG Abd 1 View  Result Date: 06/26/2021 CLINICAL DATA:  Abdominal pain EXAM: ABDOMEN - 1 VIEW COMPARISON:  None. FINDINGS: Bowel gas pattern is nonspecific. Moderate amount of stool is seen in colon without fecal impaction in the rectum. No abnormal masses or calcifications are seen. Kidneys are partly obscured by bowel contents. Surgical clips are seen in the right upper quadrant. Tip of central venous catheter is seen in the right atrium. IMPRESSION: Nonspecific bowel gas pattern. Electronically Signed   By: Ernie Avena M.D.   On: 06/26/2021 12:00    Scheduled Meds:  abacavir-dolutegravir-lamiVUDine  1 tablet Oral QHS   apixaban  5 mg Oral BID   Chlorhexidine Gluconate Cloth  6 each Topical Daily   HYDROmorphone   Intravenous Q4H   hydroxyurea  2,000 mg Oral QHS   lactulose  30 g Oral BID   loratadine  10 mg Oral Daily   multivitamin with minerals  1 tablet Oral Q breakfast   senna-docusate  1 tablet Oral BID   Continuous Infusions:  sodium chloride 10 mL/hr at 06/26/21 1300    Principal Problem:   Sickle cell pain crisis (HCC) Active Problems:   Generalized anxiety disorder   Human immunodeficiency virus (HIV) disease (HCC)   History of pulmonary embolus (PE)   Hypoxia   Leukocytosis   Sickle cell crisis (HCC)

## 2021-06-28 NOTE — Progress Notes (Signed)
?   06/28/21 0414  ?Assess: MEWS Score  ?Temp 99 ?F (37.2 ?C)  ?BP 103/60  ?Pulse Rate (!) 116  ?Resp 17  ?Level of Consciousness Alert  ?SpO2 90 %  ?O2 Device Nasal Cannula  ?O2 Flow Rate (L/min) 6 L/min  ?Assess: MEWS Score  ?MEWS Temp 0  ?MEWS Systolic 0  ?MEWS Pulse 2  ?MEWS RR 0  ?MEWS LOC 0  ?MEWS Score 2  ?MEWS Score Color Yellow  ?Assess: if the MEWS score is Yellow or Red  ?Were vital signs taken at a resting state? Yes  ?Focused Assessment Change from prior assessment (see assessment flowsheet)  ?Does the patient meet 2 or more of the SIRS criteria? No  ?MEWS guidelines implemented *See Row Information* Yes  ?Treat  ?MEWS Interventions Administered prn meds/treatments ?(EKG performed due to complaint of chest pain)  ?Take Vital Signs  ?Increase Vital Sign Frequency  Yellow: Q 2hr X 2 then Q 4hr X 2, if remains yellow, continue Q 4hrs  ?Escalate  ?MEWS: Escalate Yellow: discuss with charge nurse/RN and consider discussing with provider and RRT  ?Notify: Charge Nurse/RN  ?Name of Charge Nurse/RN Notified Blythe Stanford, RN  ?Date Charge Nurse/RN Notified 06/28/21  ?Time Charge Nurse/RN Notified [phone]  ?Notify: Provider  ?Provider Name/Title Audrea Muscat  ?Date Provider Notified 06/28/21  ?Time Provider Notified [phone]  ?Notification Type Page ?(secure chat)  ?Notification Reason Other (Comment) ?(yellow mews/complaint of chest pain)  ?Assess: SIRS CRITERIA  ?SIRS Temperature  0  ?SIRS Pulse 1  ?SIRS Respirations  0  ?SIRS WBC 0  ?SIRS Score Sum  1  ? ?Patient complained of chest pain rated 9/10. Patient stated that "It woke me up out of my sleep" PRN pain medication administered and EKG performed. This nurse as charge aware and provider notified. Will continue to monitor. ?

## 2021-06-29 LAB — COMPREHENSIVE METABOLIC PANEL
ALT: 14 U/L (ref 0–44)
AST: 36 U/L (ref 15–41)
Albumin: 3.3 g/dL — ABNORMAL LOW (ref 3.5–5.0)
Alkaline Phosphatase: 54 U/L (ref 38–126)
Anion gap: 5 (ref 5–15)
BUN: 17 mg/dL (ref 6–20)
CO2: 28 mmol/L (ref 22–32)
Calcium: 8.7 mg/dL — ABNORMAL LOW (ref 8.9–10.3)
Chloride: 103 mmol/L (ref 98–111)
Creatinine, Ser: 0.85 mg/dL (ref 0.61–1.24)
GFR, Estimated: >60 mL/min (ref >60)
Glucose, Bld: 95 mg/dL (ref 70–99)
Potassium: 4.4 mmol/L (ref 3.5–5.1)
Sodium: 136 mmol/L (ref 135–145)
Total Bilirubin: 4.9 mg/dL — ABNORMAL HIGH (ref 0.3–1.2)
Total Protein: 8.3 g/dL — ABNORMAL HIGH (ref 6.5–8.1)

## 2021-06-29 LAB — CBC WITH DIFFERENTIAL/PLATELET
Abs Immature Granulocytes: 0.05 10*3/uL (ref 0.00–0.07)
Basophils Absolute: 0.1 10*3/uL (ref 0.0–0.1)
Basophils Relative: 0 %
Eosinophils Absolute: 0.3 10*3/uL (ref 0.0–0.5)
Eosinophils Relative: 2 %
HCT: 17.1 % — ABNORMAL LOW (ref 39.0–52.0)
Hemoglobin: 6.3 g/dL — CL (ref 13.0–17.0)
Immature Granulocytes: 0 %
Lymphocytes Relative: 46 %
Lymphs Abs: 6.3 10*3/uL — ABNORMAL HIGH (ref 0.7–4.0)
MCH: 35 pg — ABNORMAL HIGH (ref 26.0–34.0)
MCHC: 36.8 g/dL — ABNORMAL HIGH (ref 30.0–36.0)
MCV: 95 fL (ref 80.0–100.0)
Monocytes Absolute: 0.7 10*3/uL (ref 0.1–1.0)
Monocytes Relative: 5 %
Neutro Abs: 6.4 10*3/uL (ref 1.7–7.7)
Neutrophils Relative %: 47 %
Platelet Morphology: NORMAL
Platelets: 413 10*3/uL — ABNORMAL HIGH (ref 150–400)
RBC: 1.8 MIL/uL — ABNORMAL LOW (ref 4.22–5.81)
RDW: 22.8 % — ABNORMAL HIGH (ref 11.5–15.5)
WBC: 13.7 10*3/uL — ABNORMAL HIGH (ref 4.0–10.5)
nRBC: 9.8 % — ABNORMAL HIGH (ref 0.0–0.2)

## 2021-06-29 LAB — PREPARE RBC (CROSSMATCH)

## 2021-06-29 MED ORDER — SODIUM CHLORIDE 0.9% IV SOLUTION: Freq: Once | INTRAVENOUS | Status: AC

## 2021-06-29 MED ORDER — HYDROMORPHONE HCL 2 MG/ML IJ SOLN
2.0000 mg | INTRAMUSCULAR | Status: AC | PRN
  Administered 2021-06-29 (×2): 2 mg via SUBCUTANEOUS
  Filled 2021-06-29 (×2): qty 1

## 2021-06-29 NOTE — Progress Notes (Signed)
?   06/29/21 1008  ?Provider Notification  ?Provider Name/Title Dr. Mikeal Hawthorne  ?Date Provider Notified 06/29/21  ?Time Provider Notified 1009  ?Notification Type Page  ?Notification Reason Critical result  ?Test performed and critical result Hgb 6.3  ?Date Critical Result Received 06/29/21  ?Time Critical Result Received 1003  ?Provider response No new orders  ?Date of Provider Response 06/29/21  ?Time of Provider Response 1009  ? ? ?

## 2021-06-29 NOTE — Progress Notes (Signed)
SICKLE CELL SERVICE PROGRESS NOTE  [name] Martin [id number] DOB: 04-20-1991 DOA: 06/24/2021 PCP: [name], [name]  Assessment/Plan: Principal Problem:   Sickle cell pain crisis ([place]) Active Problems:   Generalized anxiety disorder   Human immunodeficiency virus (HIV) disease ([place])   History of pulmonary embolus (PE)   Hypoxia   Leukocytosis   Sickle cell crisis (HCC)  Sickle cell pain crisis: Patient has improved but appears to have hemolytic crisis.  Hemoglobin has dropped to 6.3 which is way below his baseline.  We will transfuse 1 unit of packed red blood cells.  Monitor H&H afterwards.  Patient currently on Dilaudid PCA, oxycodone.  No Toradol.  Pain is still persistent.  We will continue to adjust medications and reassess his pain.  His renal function is better so I will restart Toradol. HIV disease: Continue home regimen. Chronic pain syndrome: Continue home regimen. History of PE: Continue apixaban Generalized anxiety disorder: Continue home regimen. Hyponatremia: Continue to hydrate.  Code Status: Full code Family Communication: No family at bedside Disposition Plan: [place]  Pager [phone]. If 7PM-7AM, please contact night-coverage.  06/29/2021, 10:14 AM  LOS: 4 days   Brief narrative: Patient is still having 7 out of 10 pain.  Denies any fever or chills.  Denied any nausea vomiting or diarrhea.  He is overall improving.  Hemoglobin has dropped to 6.3 from 9.3.  Probably hemodilution and partly possible hemolytic crisis.  Consultants: None  Procedures: Acute abdominal series  Antibiotics: None  HPI/Subjective: Martin [name] is a 31 year old male with a medical history significant for sickle cell disease, chronic pain syndrome, opiate dependence and tolerance, history of anemia of chronic disease, history of PE on Eliquis, generalized anxiety, and HIV disease was admitted for sickle cell pain crisis.  Patient has no new complaints  on today.  He rates pain intensity is 6/10.  He denies any chest pain, shortness of breath, urinary symptoms, nausea, vomiting, or diarrhea.  He continues to endorse left-sided abdominal discomfort.  He reports some constipation, he has not had a bowel movement over the past several days.  Objective: Vitals:   06/29/21 0459 06/29/21 0615 06/29/21 0730 06/29/21 0945  BP:  107/62  (!) 100/59  Pulse:  95  96  Resp: 14 16 14 15   Temp:  98.1 F (36.7 C)  98.5 F (36.9 C)  TempSrc:  Oral  Oral  SpO2: 90% 90% 94% 91%  Weight:      Height:       Weight change:   Intake/Output Summary (Last 24 hours) at 06/29/2021 1014 Last data filed at 06/29/2021 0155 Gross per 24 hour  Intake 801 ml  Output 1250 ml  Net -449 ml     General: Alert, awake, oriented x3, in no acute distress.  HEENT: [place]/AT PEERL, EOMI Neck: Trachea midline,  no masses, no thyromegal,y no JVD, no carotid bruit OROPHARYNX:  Moist, No exudate/ erythema/lesions.  Heart: Sinus tachycardia, without murmurs, rubs, gallops, PMI non-displaced, no heaves or thrills on palpation.  Lungs: Clear to auscultation, no wheezing or rhonchi noted. No increased vocal fremitus resonant to percussion  Abdomen: Soft, nontender, nondistended, positive bowel sounds, no masses no hepatosplenomegaly noted..  Neuro: No focal neurological deficits noted cranial nerves II through XII grossly intact. DTRs 2+ bilaterally upper and lower extremities. Strength 5 out of 5 in bilateral upper and lower extremities. Musculoskeletal: No warm swelling or erythema around joints, no spinal tenderness noted. Psychiatric: Patient alert and oriented x3, good  insight and cognition, good recent to remote recall. Lymph node survey: No cervical axillary or inguinal lymphadenopathy noted.   Data Reviewed: Basic Metabolic Panel: Recent Labs  Lab 06/24/21 1115 06/25/21 0459 06/27/21 1027 06/29/21 0500  NA 132* 132* 134* 136  K 4.6 4.0 4.3 4.4  CL 104 101 103 103   CO2 25 26 26 28   GLUCOSE 83 93 95 95  BUN 11 9 16 17   CREATININE 0.81 0.69 0.81 0.85  CALCIUM 8.2* 8.3* 8.7* 8.7*    Liver Function Tests: Recent Labs  Lab 06/24/21 1115 06/29/21 0500  AST 34 36  ALT 16 14  ALKPHOS 66 54  BILITOT 7.3* 4.9*  PROT 8.5* 8.3*  ALBUMIN 3.7 3.3*    No results for input(s): LIPASE, AMYLASE in the last 168 hours. No results for input(s): AMMONIA in the last 168 hours. CBC: Recent Labs  Lab 06/24/21 1115 06/25/21 0459 06/27/21 1027 06/29/21 0500  WBC 14.6* 11.7* 10.3 13.7*  NEUTROABS 9.0*  --   --  6.4  HGB 8.2* 7.6* 9.3* 6.3*  HCT 21.8* 20.4* 23.4* 17.1*  MCV 94.4 95.8 89.3 95.0  PLT 340 284 314 413*    Cardiac Enzymes: No results for input(s): CKTOTAL, CKMB, CKMBINDEX, TROPONINI in the last 168 hours. BNP (last 3 results) Recent Labs    05/27/21 2216  BNP 91.4     ProBNP (last 3 results) No results for input(s): PROBNP in the last 8760 hours.  CBG: No results for input(s): GLUCAP in the last 168 hours.  Recent Results (from the past 240 hour(s))  Resp Panel by RT-PCR (Flu A&B, Covid) Nasopharyngeal Swab     Status: None   Collection Time: 06/24/21  4:12 PM   Specimen: Nasopharyngeal Swab; Nasopharyngeal(NP) swabs in vial transport medium  Result Value Ref Range Status   SARS Coronavirus 2 by RT PCR NEGATIVE NEGATIVE Final    Comment: (NOTE) SARS-CoV-2 target nucleic acids are NOT DETECTED.  The SARS-CoV-2 RNA is generally detectable in upper respiratory specimens during the acute phase of infection. The lowest concentration of SARS-CoV-2 viral copies this assay can detect is 138 copies/mL. A negative result does not preclude SARS-Cov-2 infection and should not be used as the sole basis for treatment or other patient management decisions. A negative result may occur with  improper specimen collection/handling, submission of specimen other than nasopharyngeal swab, presence of viral mutation(s) within the areas targeted  by this assay, and inadequate number of viral copies(<138 copies/mL). A negative result must be combined with clinical observations, patient history, and epidemiological information. The expected result is Negative.  Fact Sheet for Patients:  EntrepreneurPulse.com.au  Fact Sheet for Healthcare Providers:  IncredibleEmployment.be  This test is no t yet approved or cleared by the [place] FDA and  has been authorized for detection and/or diagnosis of SARS-CoV-2 by FDA under an Emergency Use Authorization (EUA). This EUA will remain  in effect (meaning this test can be used) for the duration of the COVID-19 declaration under Section 564(b)(1) of the Act, 21 U.S.C.section 360bbb-3(b)(1), unless the authorization is terminated  or revoked sooner.       Influenza A by PCR NEGATIVE NEGATIVE Final   Influenza B by PCR NEGATIVE NEGATIVE Final    Comment: (NOTE) The Xpert Xpress SARS-CoV-2/FLU/RSV plus assay is intended as an aid in the diagnosis of influenza from Nasopharyngeal swab specimens and should not be used as a sole basis for treatment. Nasal washings and aspirates are unacceptable for Xpert Xpress SARS-CoV-2/FLU/RSV  testing.  Fact Sheet for Patients: EntrepreneurPulse.com.au  Fact Sheet for Healthcare Providers: IncredibleEmployment.be  This test is not yet approved or cleared by the [place] FDA and has been authorized for detection and/or diagnosis of SARS-CoV-2 by FDA under an Emergency Use Authorization (EUA). This EUA will remain in effect (meaning this test can be used) for the duration of the COVID-19 declaration under Section 564(b)(1) of the Act, 21 U.S.C. section 360bbb-3(b)(1), unless the authorization is terminated or revoked.  Performed at [facility], [place] [address], [place], [place] [address]       Studies: DG Chest 2 View  Result Date:  06/06/2021 CLINICAL DATA:  Chest pain. EXAM: CHEST - 2 VIEW COMPARISON:  Chest radiograph dated 05/27/2021. FINDINGS: Right-sided Port-A-Cath in similar position. Background of emphysema and chronic interstitial coarsening. No focal consolidation, pleural effusion, pneumothorax. The cardiac silhouette is within normal limits. No acute osseous pathology. IMPRESSION: 1. No active cardiopulmonary disease. 2. Emphysema. Electronically Signed   By: Anner Crete M.D.   On: 06/06/2021 02:12   DG Abd 1 View  Result Date: 06/26/2021 CLINICAL DATA:  Abdominal pain EXAM: ABDOMEN - 1 VIEW COMPARISON:  None. FINDINGS: Bowel gas pattern is nonspecific. Moderate amount of stool is seen in colon without fecal impaction in the rectum. No abnormal masses or calcifications are seen. Kidneys are partly obscured by bowel contents. Surgical clips are seen in the right upper quadrant. Tip of central venous catheter is seen in the right atrium. IMPRESSION: Nonspecific bowel gas pattern. Electronically Signed   By: Elmer Picker M.D.   On: 06/26/2021 12:00    Scheduled Meds:  abacavir-dolutegravir-lamiVUDine  1 tablet Oral QHS   apixaban  5 mg Oral BID   Chlorhexidine Gluconate Cloth  6 each Topical Daily   HYDROmorphone   Intravenous Q4H   hydroxyurea  2,000 mg Oral QHS   loratadine  10 mg Oral Daily   multivitamin with minerals  1 tablet Oral Q breakfast   senna-docusate  1 tablet Oral BID   Continuous Infusions:  sodium chloride 10 mL/hr at 06/29/21 [id number]    Principal Problem:   Sickle cell pain crisis (HCC) Active Problems:   Generalized anxiety disorder   Human immunodeficiency virus (HIV) disease ([place])   History of pulmonary embolus (PE)   Hypoxia   Leukocytosis   Sickle cell crisis ([place])

## 2021-06-29 NOTE — TOC Initial Note (Signed)
Transition of Care (TOC) - Initial/Assessment Note  ? ? ?Patient Details  ?Name: Martin [name] ?MRN: [id number] ?Date of Birth: Mar 28, 1991 ? ?Transition of Care (TOC) CM/SW Contact:    ?Tawanna Cooler, RN ?Phone Number: ?06/29/2021, 10:51 AM ? ?Clinical Narrative:                 ? ? ?Transition of Care (TOC) Screening Note ? ? ?Patient Details  ?Name: Martin [name] ?Date of Birth: 1990-11-24 ? ? ?Transition of Care (TOC) CM/SW Contact:    ?Tawanna Cooler, RN ?Phone Number: ?06/29/2021, 10:51 AM ? ? ? ?Transition of Care Department [facility]) has reviewed patient and no TOC needs have been identified at this time. We will continue to monitor patient advancement through interdisciplinary progression rounds. If new patient transition needs arise, please place a TOC consult. ?  ? ?Expected Discharge Plan: Home/Self Care ?Barriers to Discharge: Continued Medical Work up ? ? ? ?Expected Discharge Plan and Services ?Expected Discharge Plan: Home/Self Care ?  ?  ?  ?Living arrangements for the past 2 months: [place] ?                ?  ?  ?  ? ?Prior Living Arrangements/Services ?Living arrangements for the past 2 months: [place] ?Lives with:: Self ?Patient language and need for interpreter reviewed:: Yes ?       ?Need for Family Participation in Patient Care: Yes (Comment) ?Care giver support system in place?: Yes (comment) ?  ?Criminal Activity/Legal Involvement Pertinent to Current Situation/Hospitalization: No - Comment as needed ? ?Activities of Daily Living ?Home Assistive Devices/Equipment: None ?ADL Screening (condition at time of admission) ?Patient's cognitive ability adequate to safely complete daily activities?: Yes ?Is the patient deaf or have difficulty hearing?: No ?Does the patient have difficulty seeing, even when wearing glasses/contacts?: No ?Does the patient have difficulty concentrating, remembering, or making decisions?: No ?Patient able to express need for assistance  with ADLs?: Yes ?Does the patient have difficulty dressing or bathing?: No ?Independently performs ADLs?: Yes (appropriate for developmental age) ?Does the patient have difficulty walking or climbing stairs?: No ?Weakness of Legs: None ?Weakness of Arms/Hands: None ? ?  ?  ?Orientation: : Oriented to Self, Oriented to Place, Oriented to  Time, Oriented to Situation ?Alcohol / Substance Use: Not Applicable ?Psych Involvement: No (comment) ? ?Admission diagnosis:  Sickle cell pain crisis ([place]) [D57.00] ?Sickle cell crisis ([place]) [D57.00] ?Patient Active Problem List  ? Diagnosis Date Noted  ? Sickle cell crisis ([place]) 06/24/2021  ? Bacteremia due to Enterococcus 08/29/2020  ? Acute chest syndrome ([place]) 08/26/2020  ? Hypoxia 07/09/2020  ? COVID-19 05/13/2020  ? Sickle cell anemia with pain ([place]) 03/18/2020  ? 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 Abnormal penile discharge, without blood 02/22/2020  ? Leukocytosis 01/02/2020  ? Anxiety 11/27/2019  ? Proteinuria 11/27/2019  ? Acute on chronic respiratory failure ([place]) 11/16/2019  ? Chronic, continuous use of opioids 08/15/2019  ? Seasonal allergies 08/15/2019  ? Chest congestion   ? Sickle cell anemia with crisis ([place]) 08/04/2019  ? History of pulmonary embolus (PE) 08/04/2019  ? Single subsegmental pulmonary embolism without acute cor pulmonale ([place]) 02/10/2019  ? Acute chest syndrome due to sickle cell crisis ([place]) 02/10/2019  ? Vaso-occlusive pain due to sickle cell disease ([place]) 03/09/2018  ? Bone pain 03/07/2018  ? Hip pain 03/07/2018  ? Acute bronchitis due to Streptococcus 02/21/2018  ? Heart murmur 02/03/2017  ? Sickle  cell disease with crisis ([place]) 02/02/2017  ? 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 Vitamin D deficiency 08/14/2016  ? Sickle cell pain crisis ([place]) 02/12/2016  ? High risk medication use 09/27/2014  ? Generalized anxiety disorder 05/19/2014  ? Gastro-esophageal reflux disease without esophagitis 05/19/2014  ? Marijuana use 11/07/2012  ? Human  immunodeficiency virus (HIV) disease ([place]) 05/23/2012  ? ?PCP:  [name], [name] ?Pharmacy:   ?[id number] - [place], [place] ?[place] [address] ?Phone: [phone] Fax: [phone] ? ?[facility] ?[address] ?[place] [address] ?Phone: [phone] Fax: [phone] ? ? ? ?Readmission Risk Interventions ?Readmission Risk Prevention Plan 05/28/2021 04/04/2020 11/22/2019  ?Transportation Screening Complete Complete Complete  ?PCP or Specialist Appt within 3-5 Days Complete - Complete  ?[place] or Home Care Consult Complete - Complete  ?Social Work Consult for [place] Planning/Counseling Complete - Complete  ?Palliative Care Screening Not Applicable - Not Applicable  ?Medication Review Press photographer) Complete Complete Complete  ?PCP or Specialist appointment within 3-5 days of discharge - Not Complete -  ?PCP/Specialist Appt Not Complete comments - Not ready for dc -  ?[place] or Home Care Consult - Not Complete -  ?[place] or Home Care Consult Pt Refusal Comments - Not needed -  ?SW Recovery Care/Counseling Consult - Complete -  ?Palliative Care Screening - Not Applicable -  ?Skilled Nursing Facility - Not Applicable -  ?Some recent data might be hidden  ? ? ? ?

## 2021-06-30 ENCOUNTER — Telehealth: Payer: Self-pay

## 2021-06-30 LAB — TYPE AND SCREEN
ABO/RH(D): O POS
Antibody Screen: NEGATIVE
Donor AG Type: NEGATIVE
Unit division: 0

## 2021-06-30 LAB — CBC WITH DIFFERENTIAL/PLATELET
Abs Immature Granulocytes: 0.02 10*3/uL (ref 0.00–0.07)
Basophils Absolute: 0 10*3/uL (ref 0.0–0.1)
Basophils Relative: 0 %
Eosinophils Absolute: 0.1 10*3/uL (ref 0.0–0.5)
Eosinophils Relative: 1 %
HCT: 21.2 % — ABNORMAL LOW (ref 39.0–52.0)
Hemoglobin: 7.9 g/dL — ABNORMAL LOW (ref 13.0–17.0)
Immature Granulocytes: 0 %
Lymphocytes Relative: 61 %
Lymphs Abs: 6.7 10*3/uL — ABNORMAL HIGH (ref 0.7–4.0)
MCH: 35.6 pg — ABNORMAL HIGH (ref 26.0–34.0)
MCHC: 37.3 g/dL — ABNORMAL HIGH (ref 30.0–36.0)
MCV: 95.5 fL (ref 80.0–100.0)
Monocytes Absolute: 0.9 10*3/uL (ref 0.1–1.0)
Monocytes Relative: 8 %
Neutro Abs: 3.3 10*3/uL (ref 1.7–7.7)
Neutrophils Relative %: 30 %
Platelets: 441 10*3/uL — ABNORMAL HIGH (ref 150–400)
RBC: 2.22 MIL/uL — ABNORMAL LOW (ref 4.22–5.81)
RDW: 23.5 % — ABNORMAL HIGH (ref 11.5–15.5)
WBC: 11.1 10*3/uL — ABNORMAL HIGH (ref 4.0–10.5)
nRBC: 17.6 % — ABNORMAL HIGH (ref 0.0–0.2)

## 2021-06-30 LAB — COMPREHENSIVE METABOLIC PANEL
ALT: 16 U/L (ref 0–44)
AST: 32 U/L (ref 15–41)
Albumin: 3.4 g/dL — ABNORMAL LOW (ref 3.5–5.0)
Alkaline Phosphatase: 50 U/L (ref 38–126)
Anion gap: 6 (ref 5–15)
BUN: 12 mg/dL (ref 6–20)
CO2: 26 mmol/L (ref 22–32)
Calcium: 8.8 mg/dL — ABNORMAL LOW (ref 8.9–10.3)
Chloride: 104 mmol/L (ref 98–111)
Creatinine, Ser: 0.82 mg/dL (ref 0.61–1.24)
GFR, Estimated: >60 mL/min (ref >60)
Glucose, Bld: 86 mg/dL (ref 70–99)
Potassium: 4.2 mmol/L (ref 3.5–5.1)
Sodium: 136 mmol/L (ref 135–145)
Total Bilirubin: 3.6 mg/dL — ABNORMAL HIGH (ref 0.3–1.2)
Total Protein: 8.7 g/dL — ABNORMAL HIGH (ref 6.5–8.1)

## 2021-06-30 LAB — BPAM RBC
Blood Product Expiration Date: 202304092359
ISSUE DATE / TIME: 202303121724
Unit Type and Rh: 5100

## 2021-06-30 MED ORDER — OXYCODONE-ACETAMINOPHEN 10-325 MG PO TABS: 1.0000 | ORAL_TABLET | ORAL | 0 refills | Status: DC | PRN

## 2021-06-30 MED ORDER — GUAIFENESIN-DM 100-10 MG/5ML PO SYRP
5.0000 mL | ORAL_SOLUTION | ORAL | Status: DC | PRN
Start: 2021-06-30 — End: 2021-06-30
  Administered 2021-06-30: 5 mL via ORAL
  Filled 2021-06-30: qty 10

## 2021-06-30 NOTE — Discharge Summary (Incomplete)
.  inpat

## 2021-06-30 NOTE — Telephone Encounter (Signed)
Percocet

## 2021-07-07 ENCOUNTER — Encounter (HOSPITAL_COMMUNITY): Payer: Self-pay

## 2021-07-07 ENCOUNTER — Emergency Department (HOSPITAL_COMMUNITY)
Admission: EM | Admit: 2021-07-07 | Discharge: 2021-07-08 | Disposition: A | Discharge status: Home / Self Care | Payer: Medicaid Other | Attending: Emergency Medicine | Admitting: Emergency Medicine

## 2021-07-07 ENCOUNTER — Other Ambulatory Visit: Payer: Self-pay

## 2021-07-07 DIAGNOSIS — D649 Anemia, unspecified: Secondary | ICD-10-CM | POA: Insufficient documentation

## 2021-07-07 DIAGNOSIS — F1120 Opioid dependence, uncomplicated: Secondary | ICD-10-CM | POA: Diagnosis not present

## 2021-07-07 DIAGNOSIS — Z21 Asymptomatic human immunodeficiency virus [HIV] infection status: Secondary | ICD-10-CM | POA: Insufficient documentation

## 2021-07-07 DIAGNOSIS — G894 Chronic pain syndrome: Secondary | ICD-10-CM | POA: Insufficient documentation

## 2021-07-07 DIAGNOSIS — F419 Anxiety disorder, unspecified: Secondary | ICD-10-CM | POA: Insufficient documentation

## 2021-07-07 DIAGNOSIS — F1290 Cannabis use, unspecified, uncomplicated: Secondary | ICD-10-CM | POA: Insufficient documentation

## 2021-07-07 DIAGNOSIS — D57219 Sickle-cell/Hb-C disease with crisis, unspecified: Secondary | ICD-10-CM | POA: Diagnosis not present

## 2021-07-07 DIAGNOSIS — D72829 Elevated white blood cell count, unspecified: Secondary | ICD-10-CM | POA: Diagnosis not present

## 2021-07-07 DIAGNOSIS — D5700 Hb-SS disease with crisis, unspecified: Secondary | ICD-10-CM

## 2021-07-07 DIAGNOSIS — Z7901 Long term (current) use of anticoagulants: Secondary | ICD-10-CM | POA: Diagnosis not present

## 2021-07-07 DIAGNOSIS — M545 Low back pain, unspecified: Secondary | ICD-10-CM | POA: Diagnosis present

## 2021-07-07 NOTE — ED Notes (Signed)
80=room air     88=2L       95=3L ?

## 2021-07-07 NOTE — ED Provider Triage Note (Signed)
Emergency Medicine Provider Triage Evaluation Note ? ?Marguerite Carey Bullocks , a 31 y.o. male  was evaluated in triage.  Pt complains of full body pains.  Hx of SCD.  Normally takes 1 tablet 10mg  Percocet daily.  Has taken 4 tabs of Percocet throughout the day today with minimal relief. Denies fever, SOB, chest pain.  Pain rated 10/10.  On home O2. ? ?Review of Systems  ?Positive: As above ?Negative: As above ? ?Physical Exam  ?BP 111/69   Pulse 84   Temp 98.2 ?F (36.8 ?C) (Oral)   Resp 14   SpO2 94%  ?Gen:   Awake, no distress   ?Resp:  Normal effort, CTAB ?MSK:   Moves extremities without difficulty  ?Other:  Pain not worsened by palpation. RRR w/o M/R/G ? ?Medical Decision Making  ?Medically screening exam initiated at 11:54 PM.  Appropriate orders placed.  Adryen X Tranchina was informed that the remainder of the evaluation will be completed by another provider, this initial triage assessment does not replace that evaluation, and the importance of remaining in the ED until their evaluation is complete. ? ?Labs ordered ?  ?Prince Rome, PA-C ?[phone] ? ?

## 2021-07-07 NOTE — ED Triage Notes (Signed)
Pt reports with [facility] since today. Pt complains of pain all over.  

## 2021-07-08 ENCOUNTER — Telehealth (HOSPITAL_COMMUNITY): Payer: Self-pay

## 2021-07-08 ENCOUNTER — Non-Acute Institutional Stay (HOSPITAL_BASED_OUTPATIENT_CLINIC_OR_DEPARTMENT_OTHER)
Admission: AD | Admit: 2021-07-08 | Discharge: 2021-07-08 | Disposition: A | Discharge status: Home / Self Care | Payer: Medicaid Other | Source: Ambulatory Visit | Attending: Internal Medicine | Admitting: Internal Medicine

## 2021-07-08 ENCOUNTER — Emergency Department (HOSPITAL_COMMUNITY): Payer: Medicaid Other

## 2021-07-08 DIAGNOSIS — Z21 Asymptomatic human immunodeficiency virus [HIV] infection status: Secondary | ICD-10-CM | POA: Insufficient documentation

## 2021-07-08 DIAGNOSIS — F419 Anxiety disorder, unspecified: Secondary | ICD-10-CM | POA: Insufficient documentation

## 2021-07-08 DIAGNOSIS — D5700 Hb-SS disease with crisis, unspecified: Secondary | ICD-10-CM | POA: Insufficient documentation

## 2021-07-08 DIAGNOSIS — F1290 Cannabis use, unspecified, uncomplicated: Secondary | ICD-10-CM | POA: Insufficient documentation

## 2021-07-08 DIAGNOSIS — G894 Chronic pain syndrome: Secondary | ICD-10-CM | POA: Insufficient documentation

## 2021-07-08 DIAGNOSIS — F1120 Opioid dependence, uncomplicated: Secondary | ICD-10-CM | POA: Insufficient documentation

## 2021-07-08 LAB — COMPREHENSIVE METABOLIC PANEL
ALT: 14 U/L (ref 0–44)
AST: 27 U/L (ref 15–41)
Albumin: 3.5 g/dL (ref 3.5–5.0)
Alkaline Phosphatase: 59 U/L (ref 38–126)
Anion gap: 7 (ref 5–15)
BUN: 11 mg/dL (ref 6–20)
CO2: 22 mmol/L (ref 22–32)
Calcium: 8.5 mg/dL — ABNORMAL LOW (ref 8.9–10.3)
Chloride: 109 mmol/L (ref 98–111)
Creatinine, Ser: 0.78 mg/dL (ref 0.61–1.24)
GFR, Estimated: >60 mL/min (ref >60)
Glucose, Bld: 86 mg/dL (ref 70–99)
Potassium: 3.6 mmol/L (ref 3.5–5.1)
Sodium: 138 mmol/L (ref 135–145)
Total Bilirubin: 5.4 mg/dL — ABNORMAL HIGH (ref 0.3–1.2)
Total Protein: 8.5 g/dL — ABNORMAL HIGH (ref 6.5–8.1)

## 2021-07-08 LAB — RETICULOCYTES
Immature Retic Fract: 34.5 % — ABNORMAL HIGH (ref 2.3–15.9)
RBC.: 2.35 MIL/uL — ABNORMAL LOW (ref 4.22–5.81)
Retic Count, Absolute: 574 10*3/uL — ABNORMAL HIGH (ref 19.0–186.0)
Retic Ct Pct: 24.4 % — ABNORMAL HIGH (ref 0.4–3.1)

## 2021-07-08 LAB — CBC WITH DIFFERENTIAL/PLATELET
Abs Immature Granulocytes: 0.04 10*3/uL (ref 0.00–0.07)
Basophils Absolute: 0.1 10*3/uL (ref 0.0–0.1)
Basophils Relative: 1 %
Eosinophils Absolute: 0.1 10*3/uL (ref 0.0–0.5)
Eosinophils Relative: 1 %
HCT: 23.1 % — ABNORMAL LOW (ref 39.0–52.0)
Hemoglobin: 8.7 g/dL — ABNORMAL LOW (ref 13.0–17.0)
Immature Granulocytes: 0 %
Lymphocytes Relative: 59 %
Lymphs Abs: 7.8 10*3/uL — ABNORMAL HIGH (ref 0.7–4.0)
MCH: 38.3 pg — ABNORMAL HIGH (ref 26.0–34.0)
MCHC: 37.7 g/dL — ABNORMAL HIGH (ref 30.0–36.0)
MCV: 101.8 fL — ABNORMAL HIGH (ref 80.0–100.0)
Monocytes Absolute: 0.9 10*3/uL (ref 0.1–1.0)
Monocytes Relative: 7 %
Neutro Abs: 4.3 10*3/uL (ref 1.7–7.7)
Neutrophils Relative %: 32 %
Platelets: 273 10*3/uL (ref 150–400)
RBC: 2.27 MIL/uL — ABNORMAL LOW (ref 4.22–5.81)
RDW: 23.4 % — ABNORMAL HIGH (ref 11.5–15.5)
WBC: 13.3 10*3/uL — ABNORMAL HIGH (ref 4.0–10.5)
nRBC: 2.3 % — ABNORMAL HIGH (ref 0.0–0.2)

## 2021-07-08 MED ORDER — ONDANSETRON HCL 4 MG/2ML IJ SOLN: 4.0000 mg | Freq: Four times a day (QID) | INTRAMUSCULAR | Status: DC | PRN

## 2021-07-08 MED ORDER — SODIUM CHLORIDE 0.45 % IV SOLN: INTRAVENOUS | Status: DC

## 2021-07-08 MED ORDER — SODIUM CHLORIDE 0.9% FLUSH: 9.0000 mL | INTRAVENOUS | Status: DC | PRN

## 2021-07-08 MED ORDER — NALOXONE HCL 0.4 MG/ML IJ SOLN: 0.4000 mg | INTRAMUSCULAR | Status: DC | PRN

## 2021-07-08 MED ORDER — HYDROMORPHONE HCL 2 MG/ML IJ SOLN: 2.0000 mg | INTRAMUSCULAR | Status: AC

## 2021-07-08 MED ORDER — HYDROMORPHONE HCL 2 MG/ML IJ SOLN
2.0000 mg | INTRAMUSCULAR | Status: AC
  Administered 2021-07-08: 2 mg via INTRAVENOUS
  Filled 2021-07-08: qty 1

## 2021-07-08 MED ORDER — SODIUM CHLORIDE 0.9 % IV SOLN
12.5000 mg | Freq: Once | INTRAVENOUS | Status: AC
  Administered 2021-07-08: 12.5 mg via INTRAVENOUS
  Filled 2021-07-08: qty 12.5

## 2021-07-08 MED ORDER — ONDANSETRON HCL 4 MG/2ML IJ SOLN
4.0000 mg | INTRAMUSCULAR | Status: DC | PRN
  Administered 2021-07-08: 4 mg via INTRAVENOUS
  Filled 2021-07-08: qty 2

## 2021-07-08 MED ORDER — HEPARIN SOD (PORK) LOCK FLUSH 100 UNIT/ML IV SOLN
500.0000 [IU] | INTRAVENOUS | Status: AC | PRN
  Administered 2021-07-08: 500 [IU]
  Filled 2021-07-08: qty 5

## 2021-07-08 MED ORDER — DIPHENHYDRAMINE HCL 25 MG PO CAPS: 25.0000 mg | ORAL_CAPSULE | ORAL | Status: DC | PRN

## 2021-07-08 MED ORDER — HYDROMORPHONE 1 MG/ML IV SOLN
INTRAVENOUS | Status: DC
  Administered 2021-07-08: 30 mg via INTRAVENOUS
  Administered 2021-07-08: 8.5 mg via INTRAVENOUS
  Filled 2021-07-08: qty 30

## 2021-07-08 MED ORDER — SODIUM CHLORIDE 0.9% FLUSH
10.0000 mL | INTRAVENOUS | Status: AC | PRN
  Administered 2021-07-08: 10 mL

## 2021-07-08 MED ORDER — HEPARIN SOD (PORK) LOCK FLUSH 100 UNIT/ML IV SOLN
500.0000 [IU] | Freq: Once | INTRAVENOUS | Status: AC
  Administered 2021-07-08: 500 [IU]
  Filled 2021-07-08: qty 5

## 2021-07-08 NOTE — Discharge Instructions (Signed)
Call the sickle cell clinic in the morning as we discussed.  While you are having worsening pain, increase your home oxygen to 4-5 L/min. ? ?Return to the emergency department with uncontrolled pain, trouble breathing or shortness of breath. ?

## 2021-07-08 NOTE — H&P (Signed)
?[facility] History and Physical  ? ?Date: 07/08/2021 ? ?Patient name: Martin [name] Medical record number: [id number] ?Date of birth: November 18, 1990 Age: 31 y.o. Gender: male ?PCP: Massie Maroon, [name] ? ?Attending physician: Quentin Angst, MD ? ?Chief Complaint: Sickle cell pain  ? ?History of Present Illness: ?Martin [name] is a 31 year old male with a medical history significant for sickle cell disease, chronic pain syndrome, opiate dependence and tolerance, HIV, generalized anxiety and history of anemia of chronic disease presents with complaints of generalized pain that is consistent with his previous sickle cell pain crisis. He states that pain intensity has been increasing over the past several days and unrelieved by his home medication. He attributes current pain crisis to changes in weather. He last had percocet this am without sustained relief. Pain intensity is 8/10 characterized as intermittent and aching. He denies fever, chills, chest pain, urinary symptoms, nausea, vomiting, or diarrhea. He denies any recent travel or sick contacts.  ? ?Meds: ?Medications Prior to Admission  ?Medication Sig Dispense Refill Last Dose  ? abacavir-dolutegravir-lamiVUDine (TRIUMEQ) 600-50-300 MG tablet Take 1 tablet by mouth at bedtime.      ? acetaminophen (TYLENOL) 325 MG tablet Take 650 mg by mouth every 6 (six) hours as needed for pain or headache.     ? ALPRAZolam (XANAX) 1 MG tablet Take 1 mg by mouth 3 (three) times daily as needed for anxiety.     ? apixaban (ELIQUIS) 5 MG TABS tablet Take 5 mg by mouth 2 (two) times daily.     ? cetirizine (ZYRTEC ALLERGY) 10 MG tablet Take 1 tablet (10 mg total) by mouth daily as needed for allergies. (Patient not taking: Reported on 06/24/2021) 90 tablet 2   ? cyclobenzaprine (FLEXERIL) 10 MG tablet Take 10 mg by mouth 3 (three) times daily as needed for muscle spasms.     ? hydroxyurea (HYDREA) 500 MG capsule Take 4 capsules (2,000 mg total) by  mouth at bedtime. 120 capsule 11   ? ibuprofen (ADVIL) 600 MG tablet Take 600 mg by mouth 3 (three) times daily as needed for pain.     ? melatonin 3 MG TABS tablet Take 6 mg by mouth at bedtime.     ? Multiple Vitamin (MULTIVITAMIN WITH MINERALS) TABS tablet Take 1 tablet by mouth daily with breakfast.      ? naloxone (NARCAN) nasal spray 4 mg/0.1 mL Place 4 mg into the nose as needed for opioid reversal. (Patient not taking: Reported on 07/08/2021)     ? oxyCODONE-acetaminophen (PERCOCET) 10-325 MG tablet Take 1 tablet by mouth every 4 (four) hours as needed for pain. 90 tablet 0   ? promethazine (PHENERGAN) 12.5 MG tablet Take 12.5 mg by mouth every 8 (eight) hours as needed for nausea/vomiting.     ? SENEXON-S 8.6-50 MG tablet Take 1 tablet by mouth daily as needed for constipation.     ? ? ?Allergies: ?Ketorolac tromethamine, Tape, and Wound dressing adhesive ?Past Medical History:  ?Diagnosis Date  ? Anxiety   ? HIV (human immunodeficiency virus infection) (HCC)   ? Proteinuria   ? 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HCC)   ? Vitamin D deficiency 10/2018  ? ?Past Surgical History:  ?Procedure Laterality Date  ? IR IMAGING GUIDED PORT INSERTION  08/29/2019  ? IR REMOVAL TUN ACCESS W/ PORT W/O FL MOD SED  08/30/2020  ? TEE WITHOUT CARDIOVERSION N/A 08/30/2020  ? Procedure: TRANSESOPHAGEAL ECHOCARDIOGRAM (TEE);  Surgeon: [name],  MD;  Location: [facility];  Service: Cardiovascular;  Laterality: N/A;  ? ?Family History  ?Problem Relation Age of Onset  ? Sickle cell trait Mother   ? Sickle cell trait Father   ? Birth defects Maternal Grandmother   ? Birth defects Paternal Grandmother   ? ?Social History  ? ?Socioeconomic History  ? Marital status: Single  ?  Spouse name: Not on file  ? Number of children: Not on file  ? Years of education: Not on file  ? Highest education level: Not on file  ?Occupational History  ? Occupation: disabled  ?Tobacco Use  ? Smoking status: Never  ? Smokeless tobacco: Never  ?Vaping Use  ?  Vaping Use: Never used  ?Substance and Sexual Activity  ? Alcohol use: No  ? Drug use: Yes  ?  Types: Marijuana  ?  Comment: 2x week  ? Sexual activity: Yes  ?  Birth control/protection: Condom  ?Other Topics Concern  ? Not on file  ?Social History Narrative  ? Not on file  ? ?Social Determinants of Health  ? ?Financial Resource Strain: Not on file  ?Food Insecurity: Not on file  ?Transportation Needs: Not on file  ?Physical Activity: Not on file  ?Stress: Not on file  ?Social Connections: Not on file  ?Intimate Partner Violence: Not on file  ? ?Review of Systems  ?Constitutional:  Negative for chills and fever.  ?HENT: Negative.    ?Eyes: Negative.   ?Respiratory: Negative.    ?Cardiovascular: Negative.   ?Gastrointestinal: Negative.   ?Genitourinary: Negative.   ?Musculoskeletal:  Positive for back pain and joint pain.  ?Skin: Negative.   ? ?Physical Exam: ?Blood pressure (!) 90/55, pulse 82, temperature 99.9 ?F (37.7 ?C), temperature source Temporal, resp. rate 16, SpO2 92 %. ?Physical Exam ?Constitutional:   ?   Appearance: Normal appearance.  ?Eyes:  ?   Pupils: Pupils are equal, round, and reactive to light.  ?Cardiovascular:  ?   Rate and Rhythm: Normal rate and regular rhythm.  ?   Pulses: Normal pulses.  ?Pulmonary:  ?   Effort: Pulmonary effort is normal.  ?Abdominal:  ?   General: Bowel sounds are normal.  ?Musculoskeletal:     ?   General: Normal range of motion.  ?Skin: ?   General: Skin is warm.  ?Neurological:  ?   General: No focal deficit present.  ?   Mental Status: He is alert. Mental status is at baseline.  ?Psychiatric:     ?   Mood and Affect: Mood normal.     ?   Thought Content: Thought content normal.     ?   Judgment: Judgment normal.  ? ? ? ?Lab results: ?Results for orders placed or performed during the hospital encounter of 07/07/21 (from the past 24 hour(s))  ?Comprehensive metabolic panel     Status: Abnormal  ? Collection Time: 07/08/21 12:40 AM  ?Result Value Ref Range  ? Sodium 138  135 - 145 mmol/L  ? Potassium 3.6 3.5 - 5.1 mmol/L  ? Chloride 109 98 - 111 mmol/L  ? CO2 22 22 - 32 mmol/L  ? Glucose, Bld 86 70 - 99 mg/dL  ? BUN 11 6 - 20 mg/dL  ? Creatinine, Ser 0.78 0.61 - 1.24 mg/dL  ? Calcium 8.5 (L) 8.9 - 10.3 mg/dL  ? Total Protein 8.5 (H) 6.5 - 8.1 g/dL  ? Albumin 3.5 3.5 - 5.0 g/dL  ? AST 27 15 - 41 U/L  ?  ALT 14 0 - 44 U/L  ? Alkaline Phosphatase 59 38 - 126 U/L  ? Total Bilirubin 5.4 (H) 0.3 - 1.2 mg/dL  ? GFR, Estimated >60 >60 mL/min  ? Anion gap 7 5 - 15  ?CBC with Differential     Status: Abnormal  ? Collection Time: 07/08/21 12:40 AM  ?Result Value Ref Range  ? WBC 13.3 (H) 4.0 - 10.5 K/uL  ? RBC 2.27 (L) 4.22 - 5.81 MIL/uL  ? Hemoglobin 8.7 (L) 13.0 - 17.0 g/dL  ? HCT 23.1 (L) 39.0 - 52.0 %  ? MCV 101.8 (H) 80.0 - 100.0 fL  ? MCH 38.3 (H) 26.0 - 34.0 pg  ? MCHC 37.7 (H) 30.0 - 36.0 g/dL  ? RDW 23.4 (H) 11.5 - 15.5 %  ? Platelets 273 150 - 400 K/uL  ? nRBC 2.3 (H) 0.0 - 0.2 %  ? Neutrophils Relative % 32 %  ? Neutro Abs 4.3 1.7 - 7.7 K/uL  ? Lymphocytes Relative 59 %  ? Lymphs Abs 7.8 (H) 0.7 - 4.0 K/uL  ? Monocytes Relative 7 %  ? Monocytes Absolute 0.9 0.1 - 1.0 K/uL  ? Eosinophils Relative 1 %  ? Eosinophils Absolute 0.1 0.0 - 0.5 K/uL  ? Basophils Relative 1 %  ? Basophils Absolute 0.1 0.0 - 0.1 K/uL  ? Immature Granulocytes 0 %  ? Abs Immature Granulocytes 0.04 0.00 - 0.07 K/uL  ? Dimorphism PRESENT   ? [place]lee Massed Bodies PRESENT   ? Polychromasia MARKED   ? Sickle Cells PRESENT   ? Target Cells PRESENT   ?Reticulocytes     Status: Abnormal  ? Collection Time: 07/08/21 12:40 AM  ?Result Value Ref Range  ? Retic Ct Pct 24.4 (H) 0.4 - 3.1 %  ? RBC. 2.35 (L) 4.22 - 5.81 MIL/uL  ? Retic Count, Absolute 574.0 (H) 19.0 - 186.0 K/uL  ? Immature Retic Fract 34.5 (H) 2.3 - 15.9 %  ? ? ?Imaging results:  ?DG Chest 2 View ? ?Result Date: 07/08/2021 ?CLINICAL DATA:  Chest pain, sickle cell crisis EXAM: CHEST - 2 VIEW COMPARISON:  Chest radiographs dated 06/06/2021. CT chest dated  05/27/2021. FINDINGS: Chronic interstitial markings, lower lung predominant. No focal consolidation. No pleural effusion or pneumothorax. The heart is normal in size. Right chest power port terminates at the cavoatr

## 2021-07-08 NOTE — Progress Notes (Signed)
Patient admitted to the day infusion hospital for sickle cell pain crisis. Initially, patient reported generalized pain rated 10/10. For pain management, patient placed on Dilaudid PCA and hydrated with IV fluids. At discharge, patient rated pain at 4/10. Vital signs stable. AVS offered but patient refused. Patient alert, oriented and ambulatory at discharge.   ?

## 2021-07-08 NOTE — ED Provider Notes (Signed)
?[facility]-EMERGENCY DEPT ?Provider Note ? ? ?CSN: [id number] ?Arrival date & time: 07/07/21  2338 ? ?  ? ?History ? ?Chief Complaint  ?Patient presents with  ? Sickle Cell Pain Crisis  ? ? ?Martin [name] is a 31 y.o. male. ? ?Here for exacerbation of sickle cell pain.  Patient has a history of HIV, PE.  States compliance with his Xarelto.  Also history of hypoxia, chronically on 3 L nasal cannula currently per his report.  He uses this when he is resting and as needed with activity when he feels like he needs it.  Reports worsening of symptoms this morning, likely triggered by recent cold weather.  Reports pain in his lower back, hips, upper legs.  States that pain radiates to his shoulders.  No shortness of breath or difficulty breathing.  No cough or fever.  He takes Percocet at home chronically and states that this has not helped today.  ? ? ?  ? ?Home Medications ?Prior to Admission medications   ?Medication Sig Start Date End Date Taking? Authorizing Provider  ?abacavir-dolutegravir-lamiVUDine (TRIUMEQ) 600-50-300 MG tablet Take 1 tablet by mouth at bedtime.     [provider]  ?acetaminophen (TYLENOL) 325 MG tablet Take 650 mg by mouth every 6 (six) hours as needed for pain or headache. 11/28/20   [provider]  ?ALPRAZolam Prudy Feeler(XANAX) 1 MG tablet Take 1 mg by mouth 3 (three) times daily as needed for anxiety.    [provider]  ?apixaban (ELIQUIS) 5 MG TABS tablet Take 5 mg by mouth 2 (two) times daily.    [provider]  ?benzonatate (TESSALON) 100 MG capsule 200 mg 3 (three) times daily as needed for cough. 05/24/21   [provider]  ?cetirizine (ZYRTEC ALLERGY) 10 MG tablet Take 1 tablet (10 mg total) by mouth daily as needed for allergies. ?Patient not taking: Reported on 06/24/2021 04/18/21 04/18/22  Massie MaroonHollis, Lachina M, [name]  ?cyclobenzaprine (FLEXERIL) 10 MG tablet Take 10 mg by mouth 3 (three) times daily as needed for muscle spasms.  03/04/21   [provider]  ?hydroxyurea (HYDREA) 500 MG capsule Take 4 capsules (2,000 mg total) by mouth at bedtime. 10/07/20   Massie MaroonHollis, Lachina M, [name]  ?ibuprofen (ADVIL) 600 MG tablet Take 600 mg by mouth 3 (three) times daily as needed for pain. 05/20/21   [provider]  ?melatonin 3 MG TABS tablet Take 6 mg by mouth at bedtime. 03/04/21   [provider]  ?Multiple Vitamin (MULTIVITAMIN WITH MINERALS) TABS tablet Take 1 tablet by mouth daily with breakfast.     [provider]  ?naloxone (NARCAN) nasal spray 4 mg/0.1 mL Place 4 mg into the nose as needed for opioid reversal. 05/19/17   [provider]  ?oxyCODONE-acetaminophen (PERCOCET) 10-325 MG tablet Take 1 tablet by mouth every 4 (four) hours as needed for pain. 06/30/21 06/30/22  Massie MaroonHollis, Lachina M, [name]  ?promethazine (PHENERGAN) 12.5 MG tablet Take 12.5 mg by mouth every 8 (eight) hours as needed for nausea/vomiting. 02/04/21   [provider]  ?SENEXON-S 8.6-50 MG tablet Take 1 tablet by mouth daily as needed for constipation. 05/20/21   [provider]  ?Vitamin D, Ergocalciferol, (DRISDOL) 1.25 MG (50000 UT) CAPS capsule Take 1 capsule (50,000 Units total) by mouth every 7 (seven) days. ?Patient taking differently: Take 50,000 Units by mouth every Tuesday. 11/01/18   Kallie LocksStroud, Natalie M, [name]  ?   ? ?Allergies    ?Ketorolac tromethamine,  Tape, and Wound dressing adhesive   ? ?Review of Systems   ?Review of Systems ? ?Physical Exam ?Updated Vital Signs ?BP 111/69   Pulse 84   Temp 98.2 ?F (36.8 ?C) (Oral)   Resp 14   SpO2 94%  ? ?Physical Exam ?Vitals and nursing note reviewed.  ?Constitutional:   ?   General: He is not in acute distress. ?   Appearance: He is well-developed.  ?HENT:  ?   Head: Normocephalic and atraumatic.  ?   Right Ear: External ear normal.  ?   Left Ear: External ear normal.  ?   Nose: No congestion.  ?Eyes:  ?   General:     ?   Right eye: No discharge.     ?   Left eye: No  discharge.  ?   Conjunctiva/sclera: Conjunctivae normal.  ?Cardiovascular:  ?   Rate and Rhythm: Normal rate and regular rhythm.  ?   Heart sounds: Normal heart sounds.  ?   Comments: Port accessed, right chest wall ?Pulmonary:  ?   Effort: Pulmonary effort is normal.  ?   Breath sounds: Normal breath sounds.  ?Abdominal:  ?   Palpations: Abdomen is soft.  ?   Tenderness: There is no abdominal tenderness. There is no guarding or rebound.  ?Musculoskeletal:  ?   Cervical back: Normal range of motion and neck supple.  ?Skin: ?   General: Skin is warm and dry.  ?Neurological:  ?   Mental Status: He is alert.  ? ? ?ED Results / Procedures / Treatments   ?Labs ?(all labs ordered are listed, but only abnormal results are displayed) ?Labs Reviewed  ?COMPREHENSIVE METABOLIC PANEL - Abnormal; Notable for the following components:  ?    Result Value  ? Calcium 8.5 (*)   ? Total Protein 8.5 (*)   ? Total Bilirubin 5.4 (*)   ? All other components within normal limits  ?CBC WITH DIFFERENTIAL/PLATELET - Abnormal; Notable for the following components:  ? WBC 13.3 (*)   ? RBC 2.27 (*)   ? Hemoglobin 8.7 (*)   ? HCT 23.1 (*)   ? MCV 101.8 (*)   ? MCH 38.3 (*)   ? MCHC 37.7 (*)   ? RDW 23.4 (*)   ? nRBC 2.3 (*)   ? Lymphs Abs 7.8 (*)   ? All other components within normal limits  ?RETICULOCYTES - Abnormal; Notable for the following components:  ? Retic Ct Pct 24.4 (*)   ? RBC. 2.35 (*)   ? Retic Count, Absolute 574.0 (*)   ? Immature Retic Fract 34.5 (*)   ? All other components within normal limits  ? ? ?Radiology ?DG Chest 2 View ? ?Result Date: 07/08/2021 ?CLINICAL DATA:  Chest pain, sickle cell crisis EXAM: CHEST - 2 VIEW COMPARISON:  Chest radiographs dated 06/06/2021. CT chest dated 05/27/2021. FINDINGS: Chronic interstitial markings, lower lung predominant. No focal consolidation. No pleural effusion or pneumothorax. The heart is normal in size. Right chest power port terminates at the cavoatrial junction. IMPRESSION: Chronic  interstitial lung disease, unchanged. No evidence of acute cardiopulmonary disease. Electronically Signed   By: Charline Bills M.D.   On: 07/08/2021 00:31   ? ?Procedures ?Procedures  ? ? ?Medications Ordered in ED ?Medications  ?0.45 % sodium chloride infusion (0 mLs Intravenous Stopped 07/08/21 0447)  ?HYDROmorphone (DILAUDID) injection 2 mg (has no administration in time range)  ?ondansetron [facility]) injection 4 mg (4 mg Intravenous Given 07/08/21 0207)  ?  HYDROmorphone (DILAUDID) injection 2 mg (2 mg Intravenous Given 07/08/21 0208)  ?HYDROmorphone (DILAUDID) injection 2 mg (2 mg Intravenous Given 07/08/21 0241)  ?diphenhydrAMINE (BENADRYL) 12.5 mg in sodium chloride 0.9 % 50 mL IVPB (0 mg Intravenous Stopped 07/08/21 0447)  ?heparin lock flush 100 unit/mL (500 Units Intracatheter Given 07/08/21 0447)  ? ? ?ED Course/ Medical Decision Making/ A&P ?  ? ?Patient seen and examined. History obtained directly from patient and recent admission notes from 3/7 - 07/02/2021.  ? ?Labs/EKG: Ordered CBC, CMP, reticulocyte count. ? ?Imaging: Ordered chest x-ray. ? ?Medications/Fluids: Ordered: Dilaudid, Benadryl, Zofran per sickle cell protocol. Considered administration of: Toradol but patient is allergic. ? ?Most recent vital signs reviewed and are as follows: ?BP 98/65   Pulse 89   Temp 98.2 ?F (36.8 ?C) (Oral)   Resp (!) 23   SpO2 92%  ? ?Initial impression: Patient with pain crisis in setting of sickle cell disease, also chronic hypoxia which appears to be baseline ? ?4:26 AM Reassessment performed. Patient appears a little drowsy but stable.  He has required increased oxygen from 3 to 5 L after administration of 2 rounds of dilaudid.  ? ?When patient weaned to 3L now, he is at 89%. Discussed with patient that if symptoms are not adequately controlled, the best plan would be for admission to the hospital for additional treatment of pain and monitoring of oxygen level.  He is reluctant to do this.  He would prefer to go  home, take home medications, and call sickle cell clinic later this morning. ? ?Labs personally reviewed and interpreted including: CBC showing white blood cell count of 13.3, hemoglobin of 8.7 which is near ba

## 2021-07-08 NOTE — ED Notes (Addendum)
Pt oxygen turned up from 3L baseline to 5L due to sats dipping into the high 80s. [place], PA made aware. ?

## 2021-07-08 NOTE — Telephone Encounter (Signed)
Patient called in. Complains of  generalized pain  rates 10/10. Denied chest pain, abd pain, fever, N/V/D. Wants to come in for treatment. Last took Percocet at 6:00am. Pt states he was in the ED last night and was discharged. Pt denies exposure to anyone covid positive in the last two weeks, denies flu like symptoms today. Pt  states his mother is his transportation today. [place] [name] made aware, approved pt to be seen in the day hospital today; however pt will not be admitted from the day hospital. Instruct pt to return to ED if needs admission. Pt notified, states he does not need hospital admission today and verbalized understanding.  ?

## 2021-07-11 ENCOUNTER — Telehealth: Payer: Self-pay | Admitting: Family Medicine

## 2021-07-11 ENCOUNTER — Other Ambulatory Visit: Payer: Self-pay | Admitting: Family Medicine

## 2021-07-11 DIAGNOSIS — D571 Sickle-cell disease without crisis: Secondary | ICD-10-CM

## 2021-07-11 DIAGNOSIS — G8929 Other chronic pain: Secondary | ICD-10-CM

## 2021-07-11 MED ORDER — OXYCODONE-ACETAMINOPHEN 10-325 MG PO TABS
1.0000 | ORAL_TABLET | ORAL | 0 refills | Status: DC | PRN
Start: 2021-07-14 — End: 2021-07-31

## 2021-07-11 NOTE — Progress Notes (Signed)
Reviewed PDMP substance reporting system prior to prescribing opiate medications. No inconsistencies noted.  Meds ordered this encounter  Medications   oxyCODONE-acetaminophen (PERCOCET) 10-325 MG tablet    Sig: Take 1 tablet by mouth every 4 (four) hours as needed for pain.    Dispense:  90 tablet    Refill:  0    Order Specific Question:   Supervising Provider    Answer:   JEGEDE, OLUGBEMIGA E [[id number]]   Martin Venus Moore Kadarrius Yanke  APRN, [name], [name] [facility] [address]  [place], [place] [address] [phone]  

## 2021-07-11 NOTE — Telephone Encounter (Signed)
Percocet refill request 

## 2021-07-14 NOTE — Discharge Summary (Signed)
[facility] Discharge Summary  ? ?Patient ID: ?Martin [name] ?MRN: [id number] ?DOB/AGE: March 03, 1991 31 y.o. ? ?Admit date: 07/08/2021 ?Discharge date: 07/14/2021 ? ?Primary Care Physician:  [name], [name] ? ?Admission Diagnoses:  ?Principal Problem: ?  Sickle cell pain crisis ([place]) ? ? ?Discharge Medications: ? ?Allergies as of 07/08/2021   ? ?   Reactions  ? Ketorolac Tromethamine Swelling, Other (See Comments)  ? Patient reports facial edema and left arm edema after administration.  ? Tape Rash, Other (See Comments)  ? PLEASE DO NOT USE THE CLEAR, THICK, "PLASTIC" TAPE- only paper tape is tolerated   ? Wound Dressing Adhesive Rash  ? ?  ? ?  ?Medication List  ?  ? ?TAKE these medications   ? ?acetaminophen 325 MG tablet ?Commonly known as: TYLENOL ?Take 650 mg by mouth every 6 (six) hours as needed for pain or headache. ?  ?ALPRAZolam 1 MG tablet ?Commonly known as: Duanne Moron ?Take 1 mg by mouth 3 (three) times daily as needed for anxiety. ?  ?apixaban 5 MG Tabs tablet ?Commonly known as: ELIQUIS ?Take 5 mg by mouth 2 (two) times daily. ?  ?cetirizine 10 MG tablet ?Commonly known as: ZyrTEC Allergy ?Take 1 tablet (10 mg total) by mouth daily as needed for allergies. ?  ?cyclobenzaprine 10 MG tablet ?Commonly known as: FLEXERIL ?Take 10 mg by mouth 3 (three) times daily as needed for muscle spasms. ?  ?hydroxyurea 500 MG capsule ?Commonly known as: HYDREA ?Take 4 capsules (2,000 mg total) by mouth at bedtime. ?  ?ibuprofen 600 MG tablet ?Commonly known as: ADVIL ?Take 600 mg by mouth 3 (three) times daily as needed for pain. ?  ?melatonin 3 MG Tabs tablet ?Take 6 mg by mouth at bedtime. ?  ?multivitamin with minerals Tabs tablet ?Take 1 tablet by mouth daily with breakfast. ?  ?naloxone 4 MG/0.1ML Liqd nasal spray kit ?Commonly known as: NARCAN ?Place 4 mg into the nose as needed for opioid reversal. ?  ?promethazine 12.5 MG tablet ?Commonly known as: PHENERGAN ?Take 12.5 mg by mouth every 8  (eight) hours as needed for nausea/vomiting. ?  ?Senexon-S 8.6-50 MG tablet ?Generic drug: senna-docusate ?Take 1 tablet by mouth daily as needed for constipation. ?  ?Triumeq 600-50-300 MG tablet ?Generic drug: abacavir-dolutegravir-lamiVUDine ?Take 1 tablet by mouth at bedtime. ?  ? ?  ? ? ? ?Consults:  None ? ?Significant Diagnostic Studies:  ?DG Chest 2 View ? ?Result Date: 07/08/2021 ?CLINICAL DATA:  Chest pain, sickle cell crisis EXAM: CHEST - 2 VIEW COMPARISON:  Chest radiographs dated 06/06/2021. CT chest dated 05/27/2021. FINDINGS: Chronic interstitial markings, lower lung predominant. No focal consolidation. No pleural effusion or pneumothorax. The heart is normal in size. Right chest power port terminates at the cavoatrial junction. IMPRESSION: Chronic interstitial lung disease, unchanged. No evidence of acute cardiopulmonary disease. Electronically Signed   By: Julian Hy M.D.   On: 07/08/2021 00:31  ? ?DG Abd 1 View ? ?Result Date: 06/26/2021 ?CLINICAL DATA:  Abdominal pain EXAM: ABDOMEN - 1 VIEW COMPARISON:  None. FINDINGS: Bowel gas pattern is nonspecific. Moderate amount of stool is seen in colon without fecal impaction in the rectum. No abnormal masses or calcifications are seen. Kidneys are partly obscured by bowel contents. Surgical clips are seen in the right upper quadrant. Tip of central venous catheter is seen in the right atrium. IMPRESSION: Nonspecific bowel gas pattern. Electronically Signed   By: Elmer Picker M.D.   On: 06/26/2021  12:00   ? ?History of present illness:  ?Martin [name] is a 31 year old male with a medical history significant for sickle cell disease, chronic pain syndrome, opiate dependence and tolerance, HIV, generalized anxiety and history of anemia of chronic disease presents with complaints of generalized pain that is consistent with his previous sickle cell pain crisis. He states that pain intensity has been increasing over the past several days and  unrelieved by his home medication. He attributes current pain crisis to changes in weather. He last had percocet this am without sustained relief. Pain intensity is 8/10 characterized as intermittent and aching. He denies fever, chills, chest pain, urinary symptoms, nausea, vomiting, or diarrhea. He denies any recent travel or sick contacts.  ? ?[facility] Course: ?Patient admitted to sickle cell day infusion clinic for management of pain crisis.  ?Reviewed laboratory values, consistent with baseline.  ?Pain managed with IV dilaudid PCA ?IV fluids, 0.45% at 100 ml/hr ?Tylenol 1000 mg x 1 ?Pain intensity decreased to 4/10. Patient is requesting discharge. He is alert, oriented, and ambulating without assistance. Patient discharged home in a hemodynamically stable condition.  ? ?Discharge instructions:  ?Resume all home medications.  ? ?Follow up with PCP as previously  scheduled.  ? ?Discussed the importance of drinking 64 ounces of water daily, dehydration of red blood cells may lead further sickling.  ? ?Avoid all stressors that precipitate sickle cell pain crisis.   ? ? ?The patient was given clear instructions to go to ER or return to medical center if symptoms do not improve, worsen or new problems develop.  ? ? ? ?Physical Exam at Discharge: ? ?BP (!) 98/52 (BP Location: Right Arm)   Pulse 84   Temp 98.7 ?F (37.1 ?C) (Temporal)   Resp 12   SpO2 (!) 88%  ?Physical Exam ?Constitutional:   ?   Appearance: Normal appearance.  ?Eyes:  ?   Pupils: Pupils are equal, round, and reactive to light.  ?Cardiovascular:  ?   Rate and Rhythm: Normal rate and regular rhythm.  ?Pulmonary:  ?   Effort: Pulmonary effort is normal.  ?Abdominal:  ?   General: Bowel sounds are normal.  ?Skin: ?   General: Skin is warm.  ?Neurological:  ?   General: No focal deficit present.  ?   Mental Status: He is alert and oriented to person, place, and time. Mental status is at baseline.  ?Psychiatric:     ?   Mood and  Affect: Mood normal.     ?   Behavior: Behavior normal.     ?   Thought Content: Thought content normal.     ?   Judgment: Judgment normal.  ? ? ? ?Disposition at Discharge: ?Discharge disposition: 01-Home or Self Care ? ? ? ? ? ? ?Discharge Orders: ?Discharge Instructions   ? ? Discharge patient   Complete by: As directed ?  ? Discharge disposition: 01-Home or Self Care  ? Discharge patient date: 07/08/2021  ? ?  ? ? ?Condition at Discharge:   Stable ? ?Time spent on Discharge:  Greater than 30 minutes. ? ?Signed: ?Donia Pounds  APRN, [name], [name] ?[facility] ?[address]  ?[place], [place] [address] ?[phone] ? ?07/14/2021, 4:51 PM ?

## 2021-07-15 ENCOUNTER — Ambulatory Visit: Payer: Medicaid Other | Admitting: Family Medicine

## 2021-07-22 ENCOUNTER — Ambulatory Visit: Payer: Medicaid Other | Admitting: Family Medicine

## 2021-07-28 ENCOUNTER — Other Ambulatory Visit: Payer: Self-pay

## 2021-07-28 ENCOUNTER — Telehealth: Payer: Self-pay | Admitting: Internal Medicine

## 2021-07-28 ENCOUNTER — Encounter (HOSPITAL_COMMUNITY): Payer: Self-pay | Admitting: Emergency Medicine

## 2021-07-28 ENCOUNTER — Inpatient Hospital Stay (HOSPITAL_COMMUNITY)
Admission: EM | Admit: 2021-07-28 | Discharge: 2021-07-29 | DRG: 812 | Disposition: A | Discharge status: Home / Self Care | Payer: Medicaid Other | Attending: Internal Medicine | Admitting: Internal Medicine

## 2021-07-28 DIAGNOSIS — J9621 Acute and chronic respiratory failure with hypoxia: Secondary | ICD-10-CM | POA: Diagnosis present

## 2021-07-28 DIAGNOSIS — D5700 Hb-SS disease with crisis, unspecified: Principal | ICD-10-CM | POA: Diagnosis present

## 2021-07-28 DIAGNOSIS — B20 Human immunodeficiency virus [HIV] disease: Secondary | ICD-10-CM | POA: Diagnosis present

## 2021-07-28 DIAGNOSIS — F1190 Opioid use, unspecified, uncomplicated: Secondary | ICD-10-CM | POA: Diagnosis present

## 2021-07-28 DIAGNOSIS — Z79891 Long term (current) use of opiate analgesic: Secondary | ICD-10-CM | POA: Diagnosis not present

## 2021-07-28 DIAGNOSIS — Z9981 Dependence on supplemental oxygen: Secondary | ICD-10-CM | POA: Diagnosis not present

## 2021-07-28 DIAGNOSIS — Z7901 Long term (current) use of anticoagulants: Secondary | ICD-10-CM | POA: Diagnosis not present

## 2021-07-28 DIAGNOSIS — B350 Tinea barbae and tinea capitis: Secondary | ICD-10-CM | POA: Diagnosis present

## 2021-07-28 DIAGNOSIS — Z86711 Personal history of pulmonary embolism: Secondary | ICD-10-CM | POA: Diagnosis not present

## 2021-07-28 DIAGNOSIS — K219 Gastro-esophageal reflux disease without esophagitis: Secondary | ICD-10-CM | POA: Diagnosis present

## 2021-07-28 DIAGNOSIS — J9611 Chronic respiratory failure with hypoxia: Secondary | ICD-10-CM | POA: Diagnosis present

## 2021-07-28 DIAGNOSIS — D638 Anemia in other chronic diseases classified elsewhere: Secondary | ICD-10-CM | POA: Diagnosis present

## 2021-07-28 DIAGNOSIS — Z832 Family history of diseases of the blood and blood-forming organs and certain disorders involving the immune mechanism: Secondary | ICD-10-CM | POA: Diagnosis not present

## 2021-07-28 DIAGNOSIS — G894 Chronic pain syndrome: Secondary | ICD-10-CM | POA: Diagnosis present

## 2021-07-28 DIAGNOSIS — J9610 Chronic respiratory failure, unspecified whether with hypoxia or hypercapnia: Secondary | ICD-10-CM | POA: Diagnosis present

## 2021-07-28 DIAGNOSIS — J9620 Acute and chronic respiratory failure, unspecified whether with hypoxia or hypercapnia: Secondary | ICD-10-CM | POA: Diagnosis present

## 2021-07-28 DIAGNOSIS — Z21 Asymptomatic human immunodeficiency virus [HIV] infection status: Secondary | ICD-10-CM | POA: Diagnosis present

## 2021-07-28 LAB — CBC WITH DIFFERENTIAL/PLATELET
Abs Immature Granulocytes: 0.07 10*3/uL (ref 0.00–0.07)
Basophils Absolute: 0.1 10*3/uL (ref 0.0–0.1)
Basophils Relative: 0 %
Eosinophils Absolute: 0.2 10*3/uL (ref 0.0–0.5)
Eosinophils Relative: 1 %
HCT: 20.8 % — ABNORMAL LOW (ref 39.0–52.0)
Hemoglobin: 7.9 g/dL — ABNORMAL LOW (ref 13.0–17.0)
Immature Granulocytes: 0 %
Lymphocytes Relative: 41 %
Lymphs Abs: 6.7 10*3/uL — ABNORMAL HIGH (ref 0.7–4.0)
MCH: 40.3 pg — ABNORMAL HIGH (ref 26.0–34.0)
MCHC: 38 g/dL — ABNORMAL HIGH (ref 30.0–36.0)
MCV: 106.1 fL — ABNORMAL HIGH (ref 80.0–100.0)
Monocytes Absolute: 0.9 10*3/uL (ref 0.1–1.0)
Monocytes Relative: 6 %
Neutro Abs: 8.4 10*3/uL — ABNORMAL HIGH (ref 1.7–7.7)
Neutrophils Relative %: 52 %
Platelets: 260 10*3/uL (ref 150–400)
RBC: 1.96 MIL/uL — ABNORMAL LOW (ref 4.22–5.81)
RDW: 25.3 % — ABNORMAL HIGH (ref 11.5–15.5)
WBC: 16.4 10*3/uL — ABNORMAL HIGH (ref 4.0–10.5)
nRBC: 19.8 % — ABNORMAL HIGH (ref 0.0–0.2)

## 2021-07-28 LAB — COMPREHENSIVE METABOLIC PANEL
ALT: 14 U/L (ref 0–44)
AST: 26 U/L (ref 15–41)
Albumin: 3.6 g/dL (ref 3.5–5.0)
Alkaline Phosphatase: 51 U/L (ref 38–126)
Anion gap: 5 (ref 5–15)
BUN: 11 mg/dL (ref 6–20)
CO2: 25 mmol/L (ref 22–32)
Calcium: 8.4 mg/dL — ABNORMAL LOW (ref 8.9–10.3)
Chloride: 112 mmol/L — ABNORMAL HIGH (ref 98–111)
Creatinine, Ser: 0.85 mg/dL (ref 0.61–1.24)
GFR, Estimated: >60 mL/min (ref >60)
Glucose, Bld: 81 mg/dL (ref 70–99)
Potassium: 3.5 mmol/L (ref 3.5–5.1)
Sodium: 142 mmol/L (ref 135–145)
Total Bilirubin: 5.4 mg/dL — ABNORMAL HIGH (ref 0.3–1.2)
Total Protein: 7.7 g/dL (ref 6.5–8.1)

## 2021-07-28 LAB — RETICULOCYTES
RBC.: 2.1 MIL/uL — ABNORMAL LOW (ref 4.22–5.81)
Retic Ct Pct: >30 % — ABNORMAL HIGH (ref 0.4–3.1)

## 2021-07-28 MED ORDER — SENNOSIDES-DOCUSATE SODIUM 8.6-50 MG PO TABS
1.0000 | ORAL_TABLET | Freq: Two times a day (BID) | ORAL | Status: DC
  Administered 2021-07-28 – 2021-07-29 (×2): 1 via ORAL
  Filled 2021-07-28 (×3): qty 1

## 2021-07-28 MED ORDER — HYDROMORPHONE HCL 2 MG/ML IJ SOLN
2.0000 mg | INTRAMUSCULAR | Status: AC
  Administered 2021-07-28: 2 mg via INTRAVENOUS

## 2021-07-28 MED ORDER — NALOXONE HCL 0.4 MG/ML IJ SOLN: 0.4000 mg | INTRAMUSCULAR | Status: DC | PRN

## 2021-07-28 MED ORDER — ONDANSETRON HCL 4 MG/2ML IJ SOLN
4.0000 mg | INTRAMUSCULAR | Status: DC | PRN
  Administered 2021-07-28: 4 mg via INTRAVENOUS
  Filled 2021-07-28: qty 2

## 2021-07-28 MED ORDER — ABACAVIR-DOLUTEGRAVIR-LAMIVUD 600-50-300 MG PO TABS: 1.0000 | ORAL_TABLET | Freq: Every day | ORAL | Status: DC

## 2021-07-28 MED ORDER — DIPHENHYDRAMINE HCL 25 MG PO CAPS
25.0000 mg | ORAL_CAPSULE | ORAL | Status: DC | PRN
  Administered 2021-07-29: 25 mg via ORAL
  Filled 2021-07-28: qty 1

## 2021-07-28 MED ORDER — HYDROXYUREA 500 MG PO CAPS
2000.0000 mg | ORAL_CAPSULE | Freq: Every day | ORAL | Status: DC
  Administered 2021-07-28: 2000 mg via ORAL
  Filled 2021-07-28: qty 4

## 2021-07-28 MED ORDER — DIPHENHYDRAMINE HCL 50 MG/ML IJ SOLN
25.0000 mg | Freq: Once | INTRAMUSCULAR | Status: AC
  Administered 2021-07-28: 25 mg via INTRAVENOUS
  Filled 2021-07-28: qty 1

## 2021-07-28 MED ORDER — ONDANSETRON HCL 4 MG/2ML IJ SOLN: 4.0000 mg | Freq: Four times a day (QID) | INTRAMUSCULAR | Status: DC | PRN

## 2021-07-28 MED ORDER — SODIUM CHLORIDE 0.9% FLUSH: 9.0000 mL | INTRAVENOUS | Status: DC | PRN

## 2021-07-28 MED ORDER — SODIUM CHLORIDE 0.45 % IV SOLN
INTRAVENOUS | Status: DC
Start: 2021-07-28 — End: 2021-07-28

## 2021-07-28 MED ORDER — ALPRAZOLAM 1 MG PO TABS
1.0000 mg | ORAL_TABLET | Freq: Two times a day (BID) | ORAL | Status: DC | PRN
  Administered 2021-07-28: 1 mg via ORAL
  Filled 2021-07-28: qty 1

## 2021-07-28 MED ORDER — HYDROMORPHONE 1 MG/ML IV SOLN
INTRAVENOUS | Status: DC
  Administered 2021-07-28: 5.5 mg via INTRAVENOUS
  Administered 2021-07-29: 4 mg via INTRAVENOUS
  Administered 2021-07-29: 4.5 mg via INTRAVENOUS
  Filled 2021-07-28 (×2): qty 30

## 2021-07-28 MED ORDER — ALPRAZOLAM 0.5 MG PO TABS: 1.0000 mg | ORAL_TABLET | Freq: Three times a day (TID) | ORAL | Status: DC | PRN

## 2021-07-28 MED ORDER — DEXTROSE-NACL 5-0.45 % IV SOLN: INTRAVENOUS | Status: DC

## 2021-07-28 MED ORDER — SODIUM CHLORIDE 0.9 % IV SOLN
12.5000 mg | Freq: Once | INTRAVENOUS | Status: AC
  Administered 2021-07-28: 12.5 mg via INTRAVENOUS
  Filled 2021-07-28: qty 12.5

## 2021-07-28 MED ORDER — ENOXAPARIN SODIUM 40 MG/0.4ML IJ SOSY: 40.0000 mg | PREFILLED_SYRINGE | INTRAMUSCULAR | Status: DC

## 2021-07-28 MED ORDER — HYDROMORPHONE HCL 2 MG/ML IJ SOLN
2.0000 mg | INTRAMUSCULAR | Status: AC
  Filled 2021-07-28: qty 1

## 2021-07-28 MED ORDER — POLYETHYLENE GLYCOL 3350 17 G PO PACK: 17.0000 g | PACK | Freq: Every day | ORAL | Status: DC | PRN

## 2021-07-28 MED ORDER — CYCLOBENZAPRINE HCL 10 MG PO TABS: 10.0000 mg | ORAL_TABLET | Freq: Three times a day (TID) | ORAL | Status: DC | PRN

## 2021-07-28 MED ORDER — HYDROMORPHONE HCL 2 MG/ML IJ SOLN
2.0000 mg | Freq: Once | INTRAMUSCULAR | Status: AC
  Administered 2021-07-28: 2 mg via INTRAVENOUS
  Filled 2021-07-28: qty 1

## 2021-07-28 MED ORDER — HYDROMORPHONE HCL 2 MG/ML IJ SOLN
2.0000 mg | INTRAMUSCULAR | Status: AC
  Administered 2021-07-28: 2 mg via INTRAVENOUS
  Filled 2021-07-28: qty 1

## 2021-07-28 MED ORDER — APIXABAN 5 MG PO TABS
5.0000 mg | ORAL_TABLET | Freq: Two times a day (BID) | ORAL | Status: DC
Start: 2021-07-28 — End: 2021-07-29
  Administered 2021-07-28 – 2021-07-29 (×3): 5 mg via ORAL
  Filled 2021-07-28 (×3): qty 1

## 2021-07-28 MED ORDER — SENNOSIDES-DOCUSATE SODIUM 8.6-50 MG PO TABS: 1.0000 | ORAL_TABLET | Freq: Every day | ORAL | Status: DC | PRN

## 2021-07-28 NOTE — ED Triage Notes (Signed)
Pt reports all over sickle cell pain. States that it started earlier tonight. Reports taking a percocet PTA. O2 sat often low when presenting for SCC. 10/10. Denies N/V ?

## 2021-07-28 NOTE — ED Provider Notes (Signed)
?[facility] DEPT ?Provider Note ? ? ?CSN: [id number] ?Arrival date & time: 07/28/21  0503 ? ?  ? ?History ? ?Chief Complaint  ?Patient presents with  ? Sickle Cell Pain Crisis  ? ? ?Martin [name] is a 32 y.o. male history of HIV, sickle cell, PE on chronic anticoagulation, chronic hypoxic respiratory failure on 3 L via nasal cannula (intermittently) here for evaluation of pain which he feels is consistent with Sickle cell crisis. Pain located to lower back as well as his bilateral lower extremities.  No swelling, redness, warmth, numbness or weakness.  No bowel or bladder incontinence, saddle paresthesia.  No fever, IV drug use.  No recent trauma or injuries.Taking Oxy 10 at home without relief. No CP, SOB, cough, emesis. ? ?Hx of PE compliant with anticoagulation, has not missed any doses ? ?HPI ? ?  ? ?Home Medications ?Prior to Admission medications   ?Medication Sig Start Date End Date Taking? Authorizing Provider  ?abacavir-dolutegravir-lamiVUDine (TRIUMEQ) 600-50-300 MG tablet Take 1 tablet by mouth at bedtime.     [provider]  ?acetaminophen (TYLENOL) 325 MG tablet Take 650 mg by mouth every 6 (six) hours as needed for pain or headache. 11/28/20   [provider]  ?ALPRAZolam Duanne Moron) 1 MG tablet Take 1 mg by mouth 3 (three) times daily as needed for anxiety.    [provider]  ?apixaban (ELIQUIS) 5 MG TABS tablet Take 5 mg by mouth 2 (two) times daily.    [provider]  ?cetirizine (ZYRTEC ALLERGY) 10 MG tablet Take 1 tablet (10 mg total) by mouth daily as needed for allergies. ?Patient not taking: Reported on 06/24/2021 04/18/21 04/18/22  [name], [name]  ?cyclobenzaprine (FLEXERIL) 10 MG tablet Take 10 mg by mouth 3 (three) times daily as needed for muscle spasms. 03/04/21   [provider]  ?hydroxyurea (HYDREA) 500 MG capsule Take 4 capsules (2,000 mg total) by mouth at bedtime. 10/07/20   [name], [name]   ?ibuprofen (ADVIL) 600 MG tablet Take 600 mg by mouth 3 (three) times daily as needed for pain. 05/20/21   [provider]  ?melatonin 3 MG TABS tablet Take 6 mg by mouth at bedtime. 03/04/21   [provider]  ?Multiple Vitamin (MULTIVITAMIN WITH MINERALS) TABS tablet Take 1 tablet by mouth daily with breakfast.     [provider]  ?naloxone (NARCAN) nasal spray 4 mg/0.1 mL Place 4 mg into the nose as needed for opioid reversal. ?Patient not taking: Reported on 07/08/2021 05/19/17   [provider]  ?oxyCODONE-acetaminophen (PERCOCET) 10-325 MG tablet Take 1 tablet by mouth every 4 (four) hours as needed for pain. 07/14/21 07/14/22  [name], [name]  ?promethazine (PHENERGAN) 12.5 MG tablet Take 12.5 mg by mouth every 8 (eight) hours as needed for nausea/vomiting. 02/04/21   [provider]  ?SENEXON-S 8.6-50 MG tablet Take 1 tablet by mouth daily as needed for constipation. 05/20/21   [provider]  ?   ? ?Allergies    ?Ketorolac tromethamine, Tape, and Wound dressing adhesive   ? ?Review of Systems   ?Review of Systems  ?Constitutional: Negative.   ?HENT: Negative.    ?Respiratory: Negative.    ?Cardiovascular: Negative.   ?Gastrointestinal: Negative.   ?Genitourinary: Negative.   ?Musculoskeletal:  Positive for back pain and myalgias.  ?Skin: Negative.   ?Neurological: Negative.   ?All other systems reviewed and are negative. ? ?Physical Exam ?Updated Vital Signs ?BP Marland Kitchen)  96/59 (BP Location: Left Arm)   Pulse 88   Temp 97.7 ?F (36.5 ?C) (Oral)   Resp 18   Ht 6\' 3"  (1.905 m)   Wt 63.5 kg   SpO2 90%   BMI 17.50 kg/m?  ?Physical Exam ?Vitals and nursing note reviewed.  ?Constitutional:   ?   General: He is not in acute distress. ?   Appearance: He is well-developed. He is not ill-appearing, toxic-appearing or diaphoretic.  ?HENT:  ?   Head: Normocephalic and atraumatic.  ?   Nose: Nose normal.  ?   Mouth/Throat:  ?   Mouth: Mucous membranes are moist.   ?Eyes:  ?   Pupils: Pupils are equal, round, and reactive to light.  ?Cardiovascular:  ?   Rate and Rhythm: Normal rate and regular rhythm.  ?   Pulses: Normal pulses.  ?   Heart sounds: Normal heart sounds.  ?Pulmonary:  ?   Effort: Pulmonary effort is normal. No respiratory distress.  ?   Breath sounds: Normal breath sounds.  ?   Comments: Clear bilaterally, speaks in full sentences without difficulty ?Abdominal:  ?   General: Bowel sounds are normal. There is no distension.  ?   Palpations: Abdomen is soft.  ?   Tenderness: There is no abdominal tenderness. There is no guarding or rebound.  ?Musculoskeletal:     ?   General: No swelling, tenderness or signs of injury. Normal range of motion.  ?   Cervical back: Normal range of motion and neck supple.  ?   Right lower leg: No edema.  ?   Left lower leg: No edema.  ?   Comments: No bony tenderness, compartments soft.  No midline C/T/L tenderness.  ?Skin: ?   General: Skin is warm and dry.  ?   Capillary Refill: Capillary refill takes less than 2 seconds.  ?   Comments: No edema, erythema or warmth  ?Neurological:  ?   General: No focal deficit present.  ?   Mental Status: He is alert and oriented to person, place, and time.  ?   Comments: Ambulatory ?Equal strength  ? ? ?ED Results / Procedures / Treatments   ?Labs ?(all labs ordered are listed, but only abnormal results are displayed) ?Labs Reviewed  ?CBC WITH DIFFERENTIAL/PLATELET - Abnormal; Notable for the following components:  ?    Result Value  ? WBC 16.4 (*)   ? RBC 1.96 (*)   ? Hemoglobin 7.9 (*)   ? HCT 20.8 (*)   ? MCV 106.1 (*)   ? MCH 40.3 (*)   ? MCHC 38.0 (*)   ? RDW 25.3 (*)   ? nRBC 19.8 (*)   ? All other components within normal limits  ?RETICULOCYTES  ?COMPREHENSIVE METABOLIC PANEL  ? ? ?EKG ?None ? ?Radiology ?No results found. ? ?Procedures ?Procedures  ? ? ?Medications Ordered in ED ?Medications  ?HYDROmorphone (DILAUDID) injection 2 mg (has no administration in time range)  ?HYDROmorphone  (DILAUDID) injection 2 mg (has no administration in time range)  ?diphenhydrAMINE (BENADRYL) 12.5 mg in sodium chloride 0.9 % 50 mL IVPB (has no administration in time range)  ?ondansetron (ZOFRAN) injection 4 mg (has no administration in time range)  ?0.45 % sodium chloride infusion (has no administration in time range)  ?HYDROmorphone (DILAUDID) injection 2 mg (2 mg Intravenous Given 07/28/21 0545)  ? ? ?ED Course/ Medical Decision Making/ A&P ?  ? ?31 year old here for evaluation of lower back pain and bilateral lower  extremity pain which she feels is consistent with his prior sickle cell crises.  Taking oxycodone at home without relief.  No known traumatic injury.  Does have prior history of PE, DVT, compliant anticoagulation without any missed doses.  He is supposed to be on 3 L via nasal cannula for chronic hypoxic respiratory failure however he arrives without any supplemental oxygen.  Oxygen 90% on room air.  He denies any chest pain, shortness of breath, fever or cough.  Low suspicion for acute chest syndrome, PE.  We will plan on labs, treating pain and reassess, discussed with nursing to place patient on his home O2. ? ?Labs personally reviewed and interpreted: ? ?CBC with leukocytosis, Hgb 7.9, similar to prior ?Retic, CMP pending ? ?Care transferred to oncoming PA who will FU on labs, pain reassessment and determine dispo. ? ? ?                        ?Medical Decision Making ?Amount and/or Complexity of Data Reviewed ?External Data Reviewed: labs, radiology, ECG and notes. ?Labs: ordered. Decision-making details documented in ED Course. ? ?Risk ?OTC drugs. ?Prescription drug management. ?Parenteral controlled substances. ?Diagnosis or treatment significantly limited by social determinants of health. ? ? ? ? ? ? ? ? ?Final Clinical Impression(s) / ED Diagnoses ?Final diagnoses:  ?Sickle-cell disease with pain ([place])  ?Chronic anticoagulation  ?HIV infection, unspecified symptom status ([place])  ? ? ?Rx / DC  Orders ?ED Discharge Orders   ? ? None  ? ?  ? ? ?  ?Caysen Whang A, PA-C ?07/28/21 [id number] ? ?  ?Maudie Flakes, MD ?07/28/21 [phone] ? ?

## 2021-07-28 NOTE — Plan of Care (Signed)
?  Problem: Education: ?Goal: Knowledge of vaso-occlusive preventative measures will improve ?Outcome: Progressing ?  ?Problem: Self-Care: ?Goal: Ability to incorporate actions that prevent/reduce pain crisis will improve ?Outcome: Progressing ?  ?Problem: Bowel/Gastric: ?Goal: Gut motility will be maintained ?Outcome: Progressing ?  ?Problem: Tissue Perfusion: ?Goal: Complications related to inadequate tissue perfusion will be avoided or minimized ?Outcome: Progressing ?  ?Problem: Respiratory: ?Goal: Pulmonary complications will be avoided or minimized ?Outcome: Progressing ?  ?Problem: Fluid Volume: ?Goal: Ability to maintain a balanced intake and output will improve ?Outcome: Progressing ?  ?Problem: Sensory: ?Goal: Pain level will decrease with appropriate interventions ?Outcome: Progressing ?  ?Problem: Health Behavior: ?Goal: Postive changes in compliance with treatment and prescription regimens will improve ?Outcome: Progressing ?  ?

## 2021-07-28 NOTE — H&P (Signed)
PCP:  ? Massie MaroonHollis, Lachina M, [name]  ? ?Chief Complaint:  ?Pain in back and legs for 3 days ? ?HPI: ?Patient is a 31 year old gentleman with history of sickle cell disease, HIV disease, chronic pain and anemia of chronic disease who presents to the ER today with pain in his back and legs consistent with his typical sickle cell crisis.  Patient has taken his home regimen without any relief.  She was seen in the ER and diagnosed with acute sickle cell crisis.  Patient received a total of 6 mg of Dilaudid with no immediate relief.  He is therefore being admitted to the hospital for further evaluation and treatment.  His pain is rated as 10 out of 10 involving his legs and his lower back.  He denied any nausea or vomiting.  Denied any nausea or vomiting.  Denied any fever or chills.  Pain is consistent with his typical crisis.  Patient will be admitted for treatment of sickle cell pain crisis. ? ?Review of Systems:  ?The patient denies anorexia, fever, weight loss,, vision loss, decreased hearing, hoarseness, chest pain, syncope, dyspnea on exertion, peripheral edema, balance deficits, hemoptysis, abdominal pain, melena, hematochezia, severe indigestion/heartburn, hematuria, incontinence, genital sores, muscle weakness, suspicious skin lesions, transient blindness, difficulty walking, depression, unusual weight change, abnormal bleeding, enlarged lymph nodes, angioedema, and breast masses. ? ?Past Medical History: ?Past Medical History:  ?Diagnosis Date  ? Anxiety   ? HIV (human immunodeficiency virus infection) (HCC)   ? Proteinuria   ? 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HCC)   ? Vitamin D deficiency 10/2018  ? ?Past Surgical History:  ?Procedure Laterality Date  ? IR IMAGING GUIDED PORT INSERTION  08/29/2019  ? IR REMOVAL TUN ACCESS W/ PORT W/O FL MOD SED  08/30/2020  ? TEE WITHOUT CARDIOVERSION N/A 08/30/2020  ? Procedure: TRANSESOPHAGEAL ECHOCARDIOGRAM (TEE);  Surgeon: Chrystie NoseHilty, Kenneth C, MD;  [facility];  Service:  Cardiovascular;  Laterality: N/A;  ? ? ?Medications: ?Prior to Admission medications   ?Medication Sig Start Date End Date Taking? Authorizing Provider  ?acetaminophen (TYLENOL) 500 MG tablet Take 500-1,000 mg by mouth every 6 (six) hours as needed for mild pain.   Yes [provider]  ?ALPRAZolam Prudy Feeler(XANAX) 1 MG tablet Take 1 mg by mouth 2 (two) times daily as needed for anxiety.   Yes [provider]  ?apixaban (ELIQUIS) 5 MG TABS tablet Take 5 mg by mouth 2 (two) times daily.   Yes [provider]  ?cetirizine (ZYRTEC) 10 MG tablet Take 10 mg by mouth daily as needed for allergies.   Yes [provider]  ?hydroxyurea (HYDREA) 500 MG capsule Take 4 capsules (2,000 mg total) by mouth at bedtime. 10/07/20  Yes Massie MaroonHollis, Lachina M, [name]  ?ibuprofen (ADVIL) 600 MG tablet Take 600 mg by mouth 3 (three) times daily as needed for pain. 05/20/21  Yes [provider]  ?melatonin 3 MG TABS tablet Take 6 mg by mouth at bedtime as needed (sleep). 03/04/21  Yes [provider]  ?Multiple Vitamin (MULTIVITAMIN WITH MINERALS) TABS tablet Take 1 tablet by mouth daily with breakfast.    Yes [provider]  ?naloxone (NARCAN) nasal spray 4 mg/0.1 mL Place 4 mg into the nose as needed for opioid reversal. 05/19/17  Yes [provider]  ?oxyCODONE-acetaminophen (PERCOCET) 10-325 MG tablet Take 1 tablet by mouth every 4 (four) hours as needed for pain. 07/14/21 07/14/22 Yes Massie MaroonHollis, Lachina M, [name]  ?promethazine (PHENERGAN) 12.5 MG tablet Take 12.5 mg by  mouth every 8 (eight) hours as needed for nausea/vomiting. 02/04/21  Yes [provider]  ?SENEXON-S 8.6-50 MG tablet Take 1 tablet by mouth daily as needed for constipation. 05/20/21  Yes [provider]  ?abacavir-dolutegravir-lamiVUDine (TRIUMEQ) 600-50-300 MG tablet Take 1 tablet by mouth at bedtime.  ?Patient not taking: Reported on 07/28/2021    [provider]  ?cyclobenzaprine (FLEXERIL) 10  MG tablet Take 10 mg by mouth 3 (three) times daily as needed for muscle spasms. ?Patient not taking: Reported on 07/28/2021 03/04/21   [provider]  ? ? ?Allergies: ?  ?Allergies  ?Allergen Reactions  ? Ketorolac Tromethamine Swelling and Other (See Comments)  ?  Patient reports facial edema and left arm edema after administration.  ? Tape Rash and Other (See Comments)  ?  PLEASE DO NOT USE THE CLEAR, THICK, "PLASTIC" TAPE- only paper tape is tolerated   ? Wound Dressing Adhesive Rash  ? ? ?Social History: ? reports that he has never smoked. He has never used smokeless tobacco. He reports current drug use. Drug: Marijuana. He reports that he does not drink alcohol. ? ?Family History: ?Family History  ?Problem Relation Age of Onset  ? Sickle cell trait Mother   ? Sickle cell trait Father   ? Birth defects Maternal Grandmother   ? Birth defects Paternal Grandmother   ? ? ?Physical Exam: ?Vitals:  ? 07/28/21 1228 07/28/21 1349 07/28/21 1434 07/28/21 1634  ?BP:  111/82    ?Pulse:  67    ?Resp: 18 16  16   ?Temp:  98.4 ?F (36.9 ?C)    ?TempSrc:  Oral    ?SpO2: 91% 93% 91% 90%  ?Weight:      ?Height:      ? ?General appearance: alert, cooperative, appears stated age, and no distress ?Head: Normocephalic, without obvious abnormality, atraumatic ?Neck: no adenopathy, no carotid bruit, no JVD, supple, symmetrical, trachea midline, and thyroid not enlarged, symmetric, no tenderness/mass/nodules ?Back: symmetric, no curvature. ROM normal. No CVA tenderness. ?Resp: clear to auscultation bilaterally ?Cardio: regular rate and rhythm, S1, S2 normal, no murmur, click, rub or gallop ?GI: soft, non-tender; bowel sounds normal; no masses,  no organomegaly ?Extremities: extremities normal, atraumatic, no cyanosis or edema ?Pulses: 2+ and symmetric ?Skin: Skin color, texture, turgor normal. No rashes or lesions ?Neurologic: Grossly normal ? ? ?Labs on Admission:  ?Recent Labs  ?  07/28/21 ?0610  ?NA 142  ?K 3.5  ?CL 112*   ?CO2 25  ?GLUCOSE 81  ?BUN 11  ?CREATININE 0.85  ?CALCIUM 8.4*  ? ?Recent Labs  ?  07/28/21 ?0610  ?AST 26  ?ALT 14  ?ALKPHOS 51  ?BILITOT 5.4*  ?PROT 7.7  ?ALBUMIN 3.6  ? ?No results for input(s): LIPASE, AMYLASE in the last 72 hours. ?Recent Labs  ?  07/28/21 ?0610  ?WBC 16.4*  ?NEUTROABS 8.4*  ?HGB 7.9*  ?HCT 20.8*  ?MCV 106.1*  ?PLT 260  ? ?No results for input(s): CKTOTAL, CKMB, CKMBINDEX, TROPONINI in the last 72 hours. ?No results for input(s): TSH, T4TOTAL, T3FREE, THYROIDAB in the last 72 hours. ? ?Invalid input(s): FREET3 ?Recent Labs  ?  07/28/21 ?0610  ?RETICCTPCT >30.0*  ? ? ?Radiological Exams on Admission: ?No results found. ? ?Assessment/Plan ?Present on Admission: ? Acute on chronic respiratory failure (HCC) ? Chronic, continuous use of opioids ? Gastro-esophageal reflux disease without esophagitis ? Sickle cell anemia with crisis (HCC) ? Human immunodeficiency virus (HIV) disease (HCC) ? ?#1 sickle cell pain crisis: Patient will  be admitted for pain management.  Initiate Dilaudid PCA, Toradol, IV fluids, oral pain medications from home.  Assess pain closely and adjust medications accordingly. ? ?#2 anemia of chronic disease: Hemoglobin at 7.9 which is close to his baseline.  Continue to monitor H&H. ? ?#3 HIV disease: Patient reported being off his HIV medications.  We have no proof of that at the moment.  May need to contact infectious disease to confirm.  Due to development of resistance if someone uses the medications inconsistently, we will avoid restarting until we confirm. ? ?#4 chronic pain syndrome: Continue home regimen. ? ?#5 GERD: Continue PPIs ?Kaylaann Mountz,LAWAL ?07/28/2021, 6:23 PM  ?

## 2021-07-28 NOTE — ED Provider Notes (Addendum)
Signout from [employer] at shift change. Briefly, patient presents for sickle cell pain crisis.  Patient has chronic oxygen dependence.  He developed pain in his lower back and hips.  No chest pain or shortness of breath.  He has tried home medications without improvement. ?  ?Plan: Pain protocol, reassessment ?  ?8:34 AM Reassessment performed. Patient appears uncomfortable.  Currently on 4 L nasal cannula with oxygen saturation at 93%.  Pain is not adequately controlled at this time. ? ?Labs and imaging personally reviewed and interpreted including: Reticulocytes appropriate; CMP with T. bili 5.4 consistent with hemolytic anemia; CBC hemoglobin 7.9 which is baseline, elevated white blood cell count at 16.4 chronic.  ?  ?Reviewed additional pertinent lab work and imaging with patient at bedside. ?  ?Most current vital signs reviewed and are as follows: ?BP 98/62   Pulse 71   Temp 97.7 ?F (36.5 ?C) (Oral)   Resp 17   Ht 6\' 3"  (1.905 m)   Wt 63.5 kg   SpO2 93%   BMI 17.50 kg/m?  ? ?Plan: Discharge to sickle cell day hospital.  Discussed with Dr. who is on-call today. ?  ?Patient aware of plan.  He is in agreement. ? ?8:59 AM There is no staffing at the day hospital today.  ? ?9:25 AM Dr. Mikeal Hawthorne to see for admission.    ?  Mikeal Hawthorne, PA-C ?07/28/21 09/27/21 ? ?  ?[id number], PA-C ?07/28/21 09/27/21 ? ?  ?[id number], MD ?07/28/21 1720 ? ?

## 2021-07-28 NOTE — ED Notes (Signed)
Pt. Placed on 3L/min O2 ?

## 2021-07-28 NOTE — Telephone Encounter (Signed)
Patient requests refill for Oxycodone 10mg   ?

## 2021-07-28 NOTE — ED Notes (Signed)
This nurse received a verbal order for 2 mg hydromorphone IV injection one time from Colin Benton, MD. ?

## 2021-07-29 ENCOUNTER — Other Ambulatory Visit: Payer: Self-pay | Admitting: Internal Medicine

## 2021-07-29 ENCOUNTER — Other Ambulatory Visit (HOSPITAL_COMMUNITY): Payer: Self-pay

## 2021-07-29 DIAGNOSIS — D5700 Hb-SS disease with crisis, unspecified: Secondary | ICD-10-CM | POA: Diagnosis not present

## 2021-07-29 DIAGNOSIS — B350 Tinea barbae and tinea capitis: Secondary | ICD-10-CM | POA: Diagnosis present

## 2021-07-29 LAB — CBC WITH DIFFERENTIAL/PLATELET
Abs Immature Granulocytes: 0.02 10*3/uL (ref 0.00–0.07)
Basophils Absolute: 0 10*3/uL (ref 0.0–0.1)
Basophils Relative: 0 %
Eosinophils Absolute: 0.3 10*3/uL (ref 0.0–0.5)
Eosinophils Relative: 3 %
HCT: 22.2 % — ABNORMAL LOW (ref 39.0–52.0)
Hemoglobin: 8.1 g/dL — ABNORMAL LOW (ref 13.0–17.0)
Immature Granulocytes: 0 %
Lymphocytes Relative: 63 %
Lymphs Abs: 6.9 10*3/uL — ABNORMAL HIGH (ref 0.7–4.0)
MCH: 37.9 pg — ABNORMAL HIGH (ref 26.0–34.0)
MCHC: 36.5 g/dL — ABNORMAL HIGH (ref 30.0–36.0)
MCV: 103.7 fL — ABNORMAL HIGH (ref 80.0–100.0)
Monocytes Absolute: 0.8 10*3/uL (ref 0.1–1.0)
Monocytes Relative: 8 %
Neutro Abs: 2.9 10*3/uL (ref 1.7–7.7)
Neutrophils Relative %: 26 %
Platelets: 278 10*3/uL (ref 150–400)
RBC: 2.14 MIL/uL — ABNORMAL LOW (ref 4.22–5.81)
RDW: 22.5 % — ABNORMAL HIGH (ref 11.5–15.5)
WBC: 11 10*3/uL — ABNORMAL HIGH (ref 4.0–10.5)
nRBC: 15.2 % — ABNORMAL HIGH (ref 0.0–0.2)

## 2021-07-29 MED ORDER — ORAL CARE MOUTH RINSE
15.0000 mL | Freq: Two times a day (BID) | OROMUCOSAL | Status: DC
  Administered 2021-07-29: 15 mL via OROMUCOSAL

## 2021-07-29 MED ORDER — HEPARIN SOD (PORK) LOCK FLUSH 100 UNIT/ML IV SOLN
500.0000 [IU] | Freq: Once | INTRAVENOUS | Status: AC
  Administered 2021-07-29: 500 [IU] via INTRAVENOUS
  Filled 2021-07-29: qty 5

## 2021-07-29 MED ORDER — CHLORHEXIDINE GLUCONATE 0.12 % MT SOLN
15.0000 mL | Freq: Two times a day (BID) | OROMUCOSAL | Status: DC
  Administered 2021-07-29: 15 mL via OROMUCOSAL
  Filled 2021-07-29: qty 15

## 2021-07-29 MED ORDER — FLUCONAZOLE 100 MG PO TABS
100.0000 mg | ORAL_TABLET | Freq: Every day | ORAL | 0 refills | Status: DC
  Filled 2021-07-29: qty 14, 14d supply, fill #0

## 2021-07-29 NOTE — Discharge Summary (Signed)
Martin [name] JME:[id number] DOB: [birth year] DOA: 07/28/2021 ? ?PCP: [name], [name] ? ?Admit date: 07/28/2021 ?Discharge date: 07/29/2021 ? ?Discharge Diagnoses:  ?Principal Problem: ?  Sickle cell anemia with crisis ([place]) ?Active Problems: ?  Gastro-esophageal reflux disease without esophagitis ?  Human immunodeficiency virus (HIV) disease ([place]) ?  Chronic, continuous use of opioids ?  Acute on chronic respiratory failure ([place]) ?  Tinea capitis ? ? ?Discharge Condition: Good ? ?Disposition:  ? ?Diet: Regular ?Wt Readings from Last 3 Encounters:  ?07/28/21 63.5 kg  ?06/24/21 63.8 kg  ?06/06/21 64.5 kg  ? ? ?History of present illness:  ?Patient is a 31 year old gentleman with history of sickle cell disease, HIV disease, chronic pain and anemia of chronic disease who presents to the ER today with pain in his back and legs consistent with his typical sickle cell crisis.  Patient has taken his home regimen without any relief.  She was seen in the ER and diagnosed with acute sickle cell crisis.  Patient received a total of 6 mg of Dilaudid with no immediate relief.  He is therefore being admitted to the hospital for further evaluation and treatment.  His pain is rated as 10 out of 10 involving his legs and his lower back.  He denied any nausea or vomiting.  Denied any nausea or vomiting.  Denied any fever or chills.  Pain is consistent with his typical crisis.  Patient will be admitted for treatment of sickle cell pain crisis. ? ?Hospital Course:  ?Patient was admitted and initiated on Dilaudid PCA Toradol and IV fluids.  He also was placed on his long-acting oxycodone.  He has done better overnight.  He was in the ER twice over the weekend trying to get medications.  Following his treatment he has done better.  Pain is down to 4 out of 10.  He has prescriptions called into the pharmacy which he will take.  He was overall subsequently discharged home follow-up with PCP.  No nausea vomiting no other significant  counseling has been provided regarding patient's medications on follow-up. ? ?Discharge Exam: ?Vitals:  ? 07/29/21 1413 07/29/21 1547  ?BP: 109/90   ?Pulse: 72   ?Resp: 16 15  ?Temp: 98.2 ?F (36.8 ?C)   ?SpO2: 92% 94%  ? ?Vitals:  ? 07/29/21 0959 07/29/21 1138 07/29/21 1413 07/29/21 1547  ?BP: 96/62  109/90   ?Pulse: 72  72   ?Resp: '16 14 16 15  ' ?Temp: 98.3 ?F (36.8 ?C)  98.2 ?F (36.8 ?C)   ?TempSrc: Oral  Oral   ?SpO2: 91% 96% 92% 94%  ?Weight:      ?Height:      ? ?General: Stable with no acute ?Cardiovascular: Regular rate and rhythm ?Respiratory: Good air entry no wheezes rales or crackles ? ?Discharge Instructions ? ?Discharge Instructions   ? ? Diet - low sodium heart healthy   Complete by: As directed ?  ? Increase activity slowly   Complete by: As directed ?  ? ?  ? ?Allergies as of 07/29/2021   ? ?   Reactions  ? Ketorolac Tromethamine Swelling, Other (See Comments)  ? Patient reports facial edema and left arm edema after administration.  ? Tape Rash, Other (See Comments)  ? PLEASE DO NOT USE THE CLEAR, THICK, "PLASTIC" TAPE- only paper tape is tolerated   ? Wound Dressing Adhesive Rash  ? ?  ? ?  ?Medication List  ?  ? ?TAKE these medications   ? ?acetaminophen 500  MG tablet ?Commonly known as: TYLENOL ?Take 500-1,000 mg by mouth every 6 (six) hours as needed for mild pain. ?  ?ALPRAZolam 1 MG tablet ?Commonly known as: Duanne Moron ?Take 1 mg by mouth 2 (two) times daily as needed for anxiety. ?  ?apixaban 5 MG Tabs tablet ?Commonly known as: ELIQUIS ?Take 5 mg by mouth 2 (two) times daily. ?  ?cetirizine 10 MG tablet ?Commonly known as: ZYRTEC ?Take 10 mg by mouth daily as needed for allergies. ?  ?cyclobenzaprine 10 MG tablet ?Commonly known as: FLEXERIL ?Take 10 mg by mouth 3 (three) times daily as needed for muscle spasms. ?  ?fluconazole 100 MG tablet ?Commonly known as: Diflucan ?Take 1 tablet (100 mg total) by mouth daily for 14 days. ?  ?hydroxyurea 500 MG capsule ?Commonly known as: HYDREA ?Take 4  capsules (2,000 mg total) by mouth at bedtime. ?  ?ibuprofen 600 MG tablet ?Commonly known as: ADVIL ?Take 600 mg by mouth 3 (three) times daily as needed for pain. ?  ?melatonin 3 MG Tabs tablet ?Take 6 mg by mouth at bedtime as needed (sleep). ?  ?multivitamin with minerals Tabs tablet ?Take 1 tablet by mouth daily with breakfast. ?  ?naloxone 4 MG/0.1ML Liqd nasal spray kit ?Commonly known as: NARCAN ?Place 4 mg into the nose as needed for opioid reversal. ?  ?oxyCODONE-acetaminophen 10-325 MG tablet ?Commonly known as: Percocet ?Take 1 tablet by mouth every 4 (four) hours as needed for pain. ?  ?promethazine 12.5 MG tablet ?Commonly known as: PHENERGAN ?Take 12.5 mg by mouth every 8 (eight) hours as needed for nausea/vomiting. ?  ?Senexon-S 8.6-50 MG tablet ?Generic drug: senna-docusate ?Take 1 tablet by mouth daily as needed for constipation. ?  ?Triumeq 600-50-300 MG tablet ?Generic drug: abacavir-dolutegravir-lamiVUDine ?Take 1 tablet by mouth at bedtime. ?  ? ?  ? ? ? ? ?The results of significant diagnostics from this hospitalization (including imaging, microbiology, ancillary and laboratory) are listed below for reference.   ? ?Significant Diagnostic Studies: ?DG Chest 2 View ? ?Result Date: 07/08/2021 ?CLINICAL DATA:  Chest pain, sickle cell crisis EXAM: CHEST - 2 VIEW COMPARISON:  Chest radiographs dated 06/06/2021. CT chest dated 05/27/2021. FINDINGS: Chronic interstitial markings, lower lung predominant. No focal consolidation. No pleural effusion or pneumothorax. The heart is normal in size. Right chest power port terminates at the cavoatrial junction. IMPRESSION: Chronic interstitial lung disease, unchanged. No evidence of acute cardiopulmonary disease. Electronically Signed   By: Julian Hy M.D.   On: 07/08/2021 00:31   ? ?Microbiology: ?No results found for this or any previous visit (from the past 240 hour(s)).  ? ?Labs: ?Basic Metabolic Panel: ?Recent Labs  ?Lab 07/28/21 ?0610  ?NA 142  ?K  3.5  ?CL 112*  ?CO2 25  ?GLUCOSE 81  ?BUN 11  ?CREATININE 0.85  ?CALCIUM 8.4*  ? ?Liver Function Tests: ?Recent Labs  ?Lab 07/28/21 ?0610  ?AST 26  ?ALT 14  ?ALKPHOS 51  ?BILITOT 5.4*  ?PROT 7.7  ?ALBUMIN 3.6  ? ?No results for input(s): LIPASE, AMYLASE in the last 168 hours. ?No results for input(s): AMMONIA in the last 168 hours. ?CBC: ?Recent Labs  ?Lab 07/28/21 ?0610 07/29/21 ?1024  ?WBC 16.4* 11.0*  ?NEUTROABS 8.4* 2.9  ?HGB 7.9* 8.1*  ?HCT 20.8* 22.2*  ?MCV 106.1* 103.7*  ?PLT 260 278  ? ?Cardiac Enzymes: ?No results for input(s): CKTOTAL, CKMB, CKMBINDEX, TROPONINI in the last 168 hours. ?BNP: ?Invalid input(s): POCBNP ?CBG: ?No results for input(s): GLUCAP in the  last 168 hours. ? ?Time coordinating discharge: 33 minutes ? ?Signed: ? ?Alverta Caccamo,LAWAL  ?[facility] ?07/29/2021, 4:00 PM ? ?  ?

## 2021-07-29 NOTE — Progress Notes (Signed)
Patient developing new rash to face bilaterally and groin. PRN Benadryl given and Dr. Mikeal Hawthorne made aware. ?

## 2021-07-31 ENCOUNTER — Telehealth: Payer: Self-pay

## 2021-07-31 ENCOUNTER — Other Ambulatory Visit: Payer: Self-pay | Admitting: Internal Medicine

## 2021-07-31 DIAGNOSIS — G8929 Other chronic pain: Secondary | ICD-10-CM

## 2021-07-31 DIAGNOSIS — D571 Sickle-cell disease without crisis: Secondary | ICD-10-CM

## 2021-07-31 MED ORDER — OXYCODONE-ACETAMINOPHEN 10-325 MG PO TABS: 1.0000 | ORAL_TABLET | ORAL | 0 refills | Status: DC | PRN

## 2021-07-31 NOTE — Telephone Encounter (Signed)
Oxycodone  °

## 2021-08-03 ENCOUNTER — Emergency Department (HOSPITAL_COMMUNITY): Payer: Medicaid Other

## 2021-08-03 ENCOUNTER — Other Ambulatory Visit: Payer: Self-pay

## 2021-08-03 ENCOUNTER — Inpatient Hospital Stay (HOSPITAL_COMMUNITY)
Admission: EM | Admit: 2021-08-03 | Discharge: 2021-08-03 | DRG: 811 | Discharge status: Against Medical Advice | Payer: Medicaid Other | Attending: Internal Medicine | Admitting: Internal Medicine

## 2021-08-03 ENCOUNTER — Encounter (HOSPITAL_COMMUNITY): Payer: Self-pay

## 2021-08-03 DIAGNOSIS — Z20822 Contact with and (suspected) exposure to covid-19: Secondary | ICD-10-CM | POA: Diagnosis present

## 2021-08-03 DIAGNOSIS — Z7901 Long term (current) use of anticoagulants: Secondary | ICD-10-CM

## 2021-08-03 DIAGNOSIS — F1190 Opioid use, unspecified, uncomplicated: Secondary | ICD-10-CM | POA: Diagnosis present

## 2021-08-03 DIAGNOSIS — Z79899 Other long term (current) drug therapy: Secondary | ICD-10-CM

## 2021-08-03 DIAGNOSIS — J9621 Acute and chronic respiratory failure with hypoxia: Secondary | ICD-10-CM | POA: Diagnosis present

## 2021-08-03 DIAGNOSIS — Z9109 Other allergy status, other than to drugs and biological substances: Secondary | ICD-10-CM

## 2021-08-03 DIAGNOSIS — Z86711 Personal history of pulmonary embolism: Secondary | ICD-10-CM

## 2021-08-03 DIAGNOSIS — F1120 Opioid dependence, uncomplicated: Secondary | ICD-10-CM | POA: Diagnosis present

## 2021-08-03 DIAGNOSIS — Z832 Family history of diseases of the blood and blood-forming organs and certain disorders involving the immune mechanism: Secondary | ICD-10-CM

## 2021-08-03 DIAGNOSIS — F419 Anxiety disorder, unspecified: Secondary | ICD-10-CM | POA: Diagnosis present

## 2021-08-03 DIAGNOSIS — Z888 Allergy status to other drugs, medicaments and biological substances status: Secondary | ICD-10-CM | POA: Diagnosis not present

## 2021-08-03 DIAGNOSIS — D5701 Hb-SS disease with acute chest syndrome: Secondary | ICD-10-CM

## 2021-08-03 DIAGNOSIS — E559 Vitamin D deficiency, unspecified: Secondary | ICD-10-CM | POA: Diagnosis present

## 2021-08-03 DIAGNOSIS — I2699 Other pulmonary embolism without acute cor pulmonale: Secondary | ICD-10-CM | POA: Diagnosis present

## 2021-08-03 DIAGNOSIS — G894 Chronic pain syndrome: Secondary | ICD-10-CM | POA: Diagnosis present

## 2021-08-03 DIAGNOSIS — Z09 Encounter for follow-up examination after completed treatment for conditions other than malignant neoplasm: Secondary | ICD-10-CM

## 2021-08-03 DIAGNOSIS — D72829 Elevated white blood cell count, unspecified: Secondary | ICD-10-CM | POA: Diagnosis present

## 2021-08-03 DIAGNOSIS — Z9981 Dependence on supplemental oxygen: Secondary | ICD-10-CM

## 2021-08-03 DIAGNOSIS — B20 Human immunodeficiency virus [HIV] disease: Secondary | ICD-10-CM | POA: Diagnosis present

## 2021-08-03 DIAGNOSIS — Z21 Asymptomatic human immunodeficiency virus [HIV] infection status: Secondary | ICD-10-CM | POA: Diagnosis present

## 2021-08-03 DIAGNOSIS — D5700 Hb-SS disease with crisis, unspecified: Principal | ICD-10-CM | POA: Diagnosis present

## 2021-08-03 DIAGNOSIS — J9610 Chronic respiratory failure, unspecified whether with hypoxia or hypercapnia: Secondary | ICD-10-CM | POA: Diagnosis present

## 2021-08-03 DIAGNOSIS — J9620 Acute and chronic respiratory failure, unspecified whether with hypoxia or hypercapnia: Secondary | ICD-10-CM | POA: Diagnosis present

## 2021-08-03 LAB — URINALYSIS, ROUTINE W REFLEX MICROSCOPIC
Bilirubin Urine: NEGATIVE
Glucose, UA: NEGATIVE mg/dL
Ketones, ur: NEGATIVE mg/dL
Leukocytes,Ua: NEGATIVE
Nitrite: NEGATIVE
Protein, ur: NEGATIVE mg/dL
Specific Gravity, Urine: 1.011 (ref 1.005–1.030)
pH: 6 (ref 5.0–8.0)

## 2021-08-03 LAB — RESPIRATORY PANEL BY PCR

## 2021-08-03 LAB — RESP PANEL BY RT-PCR (FLU A&B, COVID) ARPGX2
Influenza A by PCR: NEGATIVE
Influenza B by PCR: NEGATIVE
SARS Coronavirus 2 by RT PCR: NEGATIVE

## 2021-08-03 LAB — RETICULOCYTES
Immature Retic Fract: 26.7 % — ABNORMAL HIGH (ref 2.3–15.9)
RBC.: 2.08 MIL/uL — ABNORMAL LOW (ref 4.22–5.81)
Retic Count, Absolute: 578 10*3/uL — ABNORMAL HIGH (ref 19.0–186.0)
Retic Ct Pct: 27.8 % — ABNORMAL HIGH (ref 0.4–3.1)

## 2021-08-03 LAB — CBC WITH DIFFERENTIAL/PLATELET
Abs Immature Granulocytes: 0.04 10*3/uL (ref 0.00–0.07)
Basophils Absolute: 0 10*3/uL (ref 0.0–0.1)
Basophils Relative: 0 %
Eosinophils Absolute: 0.2 10*3/uL (ref 0.0–0.5)
Eosinophils Relative: 2 %
HCT: 21.6 % — ABNORMAL LOW (ref 39.0–52.0)
Hemoglobin: 8.1 g/dL — ABNORMAL LOW (ref 13.0–17.0)
Immature Granulocytes: 0 %
Lymphocytes Relative: 48 %
Lymphs Abs: 5.2 10*3/uL — ABNORMAL HIGH (ref 0.7–4.0)
MCH: 38.4 pg — ABNORMAL HIGH (ref 26.0–34.0)
MCHC: 37.5 g/dL — ABNORMAL HIGH (ref 30.0–36.0)
MCV: 102.4 fL — ABNORMAL HIGH (ref 80.0–100.0)
Monocytes Absolute: 1.1 10*3/uL — ABNORMAL HIGH (ref 0.1–1.0)
Monocytes Relative: 10 %
Neutro Abs: 4.4 10*3/uL (ref 1.7–7.7)
Neutrophils Relative %: 40 %
Platelets: 370 10*3/uL (ref 150–400)
RBC: 2.11 MIL/uL — ABNORMAL LOW (ref 4.22–5.81)
RDW: 25.2 % — ABNORMAL HIGH (ref 11.5–15.5)
WBC: 11 10*3/uL — ABNORMAL HIGH (ref 4.0–10.5)
nRBC: 11.7 % — ABNORMAL HIGH (ref 0.0–0.2)

## 2021-08-03 LAB — COMPREHENSIVE METABOLIC PANEL
ALT: 15 U/L (ref 0–44)
AST: 42 U/L — ABNORMAL HIGH (ref 15–41)
Albumin: 3.9 g/dL (ref 3.5–5.0)
Alkaline Phosphatase: 58 U/L (ref 38–126)
Anion gap: 6 (ref 5–15)
BUN: 7 mg/dL (ref 6–20)
CO2: 25 mmol/L (ref 22–32)
Calcium: 8.8 mg/dL — ABNORMAL LOW (ref 8.9–10.3)
Chloride: 110 mmol/L (ref 98–111)
Creatinine, Ser: 0.79 mg/dL (ref 0.61–1.24)
GFR, Estimated: >60 mL/min (ref >60)
Glucose, Bld: 76 mg/dL (ref 70–99)
Potassium: 4.2 mmol/L (ref 3.5–5.1)
Sodium: 141 mmol/L (ref 135–145)
Total Bilirubin: 7.4 mg/dL — ABNORMAL HIGH (ref 0.3–1.2)
Total Protein: 8.5 g/dL — ABNORMAL HIGH (ref 6.5–8.1)

## 2021-08-03 MED ORDER — SODIUM CHLORIDE 0.9 % IV SOLN
2.0000 g | INTRAVENOUS | Status: DC
  Administered 2021-08-03: 2 g via INTRAVENOUS
  Filled 2021-08-03 (×2): qty 20

## 2021-08-03 MED ORDER — AZITHROMYCIN 250 MG PO TABS
500.0000 mg | ORAL_TABLET | Freq: Once | ORAL | Status: AC
  Administered 2021-08-03: 500 mg via ORAL
  Filled 2021-08-03: qty 2

## 2021-08-03 MED ORDER — ABACAVIR-DOLUTEGRAVIR-LAMIVUD 600-50-300 MG PO TABS
1.0000 | ORAL_TABLET | Freq: Every day | ORAL | Status: DC
  Filled 2021-08-03: qty 1

## 2021-08-03 MED ORDER — HYDROMORPHONE HCL 2 MG/ML IJ SOLN
2.0000 mg | INTRAMUSCULAR | Status: AC
  Administered 2021-08-03: 2 mg via INTRAVENOUS
  Filled 2021-08-03: qty 1

## 2021-08-03 MED ORDER — IOHEXOL 350 MG/ML SOLN
75.0000 mL | Freq: Once | INTRAVENOUS | Status: AC | PRN
  Administered 2021-08-03: 75 mL via INTRAVENOUS

## 2021-08-03 MED ORDER — PSEUDOEPHEDRINE HCL ER 120 MG PO TB12
120.0000 mg | ORAL_TABLET | Freq: Two times a day (BID) | ORAL | Status: DC
  Administered 2021-08-03: 120 mg via ORAL
  Filled 2021-08-03 (×2): qty 1

## 2021-08-03 MED ORDER — LORATADINE 10 MG PO TABS
10.0000 mg | ORAL_TABLET | Freq: Every day | ORAL | Status: DC
  Administered 2021-08-03: 10 mg via ORAL
  Filled 2021-08-03: qty 1

## 2021-08-03 MED ORDER — DEXTROSE-NACL 5-0.45 % IV SOLN: INTRAVENOUS | Status: DC

## 2021-08-03 MED ORDER — ADULT MULTIVITAMIN W/MINERALS CH: 1.0000 | ORAL_TABLET | Freq: Every day | ORAL | Status: DC

## 2021-08-03 MED ORDER — ONDANSETRON HCL 4 MG/2ML IJ SOLN
4.0000 mg | INTRAMUSCULAR | Status: DC | PRN
  Administered 2021-08-03: 4 mg via INTRAVENOUS
  Filled 2021-08-03: qty 2

## 2021-08-03 MED ORDER — NALOXONE HCL 0.4 MG/ML IJ SOLN
0.4000 mg | INTRAMUSCULAR | Status: DC | PRN
Start: 2021-08-03 — End: 2021-08-04

## 2021-08-03 MED ORDER — MELATONIN 3 MG PO TABS: 6.0000 mg | ORAL_TABLET | Freq: Every evening | ORAL | Status: DC | PRN

## 2021-08-03 MED ORDER — DIPHENHYDRAMINE HCL 25 MG PO CAPS
25.0000 mg | ORAL_CAPSULE | ORAL | Status: DC | PRN
  Administered 2021-08-03: 25 mg via ORAL
  Filled 2021-08-03: qty 1

## 2021-08-03 MED ORDER — AZITHROMYCIN 250 MG PO TABS: 500.0000 mg | ORAL_TABLET | Freq: Every day | ORAL | Status: DC

## 2021-08-03 MED ORDER — HEPARIN SOD (PORK) LOCK FLUSH 100 UNIT/ML IV SOLN
500.0000 [IU] | INTRAVENOUS | Status: DC | PRN
  Filled 2021-08-03: qty 5

## 2021-08-03 MED ORDER — SENNOSIDES-DOCUSATE SODIUM 8.6-50 MG PO TABS
1.0000 | ORAL_TABLET | Freq: Two times a day (BID) | ORAL | Status: DC
  Filled 2021-08-03: qty 1

## 2021-08-03 MED ORDER — HYDROXYUREA 500 MG PO CAPS: 2000.0000 mg | ORAL_CAPSULE | Freq: Every day | ORAL | Status: DC

## 2021-08-03 MED ORDER — HYDROMORPHONE HCL 1 MG/ML IJ SOLN: 1.0000 mg | INTRAMUSCULAR | Status: DC | PRN

## 2021-08-03 MED ORDER — SODIUM CHLORIDE 0.9% FLUSH
9.0000 mL | INTRAVENOUS | Status: DC | PRN
Start: 2021-08-03 — End: 2021-08-04

## 2021-08-03 MED ORDER — HYDROMORPHONE 1 MG/ML IV SOLN
INTRAVENOUS | Status: DC
  Administered 2021-08-03: 6 mg via INTRAVENOUS
  Administered 2021-08-03: 10.5 mg via INTRAVENOUS
  Filled 2021-08-03: qty 30

## 2021-08-03 MED ORDER — HYDROMORPHONE HCL 2 MG/ML IJ SOLN
2.0000 mg | Freq: Once | INTRAMUSCULAR | Status: AC
  Administered 2021-08-03: 2 mg via INTRAVENOUS
  Filled 2021-08-03: qty 1

## 2021-08-03 MED ORDER — POLYETHYLENE GLYCOL 3350 17 G PO PACK: 17.0000 g | PACK | Freq: Every day | ORAL | Status: DC | PRN

## 2021-08-03 MED ORDER — HEPARIN SOD (PORK) LOCK FLUSH 100 UNIT/ML IV SOLN: 500.0000 [IU] | INTRAVENOUS | Status: DC

## 2021-08-03 MED ORDER — APIXABAN 5 MG PO TABS
5.0000 mg | ORAL_TABLET | Freq: Two times a day (BID) | ORAL | Status: DC
  Administered 2021-08-03: 5 mg via ORAL
  Filled 2021-08-03: qty 1

## 2021-08-03 NOTE — H&P (Signed)
H&P ? Patient Demographics:  ?Martin [name], is a 31 y.o. male  MRN: [id number]   DOB - Sep 09, 1990 ? ?Admit Date - 08/03/2021 ? ?Outpatient Primary MD for the patient is Massie Maroon, [name] ? ?Chief Complaint  ?Patient presents with  ? Sickle Cell Pain Crisis  ?  ? ? HPI:  ? ?Martin [name]  is a 31 y.o. male with a medical history significant for sickle cell disease, chronic pain syndrome, opiate dependence and tolerance, history of HIV, history of PE on Eliquis, and history of anemia of chronic disease presents to the emergency department with complaints of of pain to low back and lower extremities.  This pain is consistent with his typical sickle cell crisis.  Patient has been taking his home Percocet consistently without very much relief.  Patient has not identified any palliative or provocative factors concerning current crisis.  He states that he has been taking all of his home medications consistently including hydroxyurea and Eliquis.  He denies headache, chest pain, shortness of breath, urinary symptoms, nausea, vomiting, or diarrhea.  Patient has had no sick contacts, recent travel, or known exposure to COVID-19. ? ?ER Course:  ?Vital signs show: BP 113/80   Pulse 69   Temp 98.9 ?F (37.2 ?C) (Oral)   Resp 14   Ht 6\' 3"  (1.905 m)   Wt 68 kg   SpO2 92%   BMI 18.75 kg/m?  ? ?While in the ER, oxygen saturation decreased below 90%.  Oxygen has been as low as 87% on 4 L.  Patient is on home oxygen and typically uses at bedtime.  CT showed small nonocclusive segmental 5 pulmonary embolism in the right lower lobe.  Worsening aeration in the lungs, nonspecific pattern which could be reflective of atypical infection such as viral pneumonia, or could simply reflect worsening pulmonary edema particularly in the setting cardiomegaly.  Dilatation of the pulmonary trunk, concerning for pulmonary arterial hypertension.  Complete blood count shows WBCs 11, hemoglobin 8.1 g/dL, and platelets .  Complete  metabolic panel shows total bilirubin elevated at 7.4, otherwise unremarkable. ?Patient's pain persists despite IV Dilaudid and IV fluids.  Patient thereby admitted to [facility] for sickle cell pain crisis. ? ? ? Review of systems:  ?In addition to the HPI above, patient reports ?No fever or chills ?No Headache, No changes with vision or hearing ?No problems swallowing food or liquids ?No chest pain, cough or shortness of breath ?No abdominal pain, No nausea or vomiting, Bowel movements are regular ?No blood in stool or urine ?No dysuria ?No new skin rashes or bruises ?No new joints pains-aches ?No new weakness, tingling, numbness in any extremity ?No recent weight gain or loss ?No polyuria, polydypsia or polyphagia ?No significant Mental Stressors ? ?A full 10 point Review of Systems was done, except as stated above, all other Review of Systems were negative. ? ?With Past History of the following :  ? ?Past Medical History:  ?Diagnosis Date  ? Anxiety   ? HIV (human immunodeficiency virus infection) (HCC)   ? Proteinuria   ? 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HCC)   ? Vitamin D deficiency 10/2018  ?   ? ?Past Surgical History:  ?Procedure Laterality Date  ? IR IMAGING GUIDED PORT INSERTION  08/29/2019  ? IR REMOVAL TUN ACCESS W/ PORT W/O FL MOD SED  08/30/2020  ? TEE WITHOUT CARDIOVERSION N/A 08/30/2020  ? Procedure: TRANSESOPHAGEAL ECHOCARDIOGRAM (TEE);  Surgeon: 09/01/2020, MD;  Location: [facility] ENDOSCOPY;  Service: Cardiovascular;  Laterality: N/A;  ? ? ? Social History:  ? ?Social History  ? ?Tobacco Use  ? Smoking status: Never  ? Smokeless tobacco: Never  ?Substance Use Topics  ? Alcohol use: No  ?  ? ?Lives - At home ? ? Family History :  ? ?Family History  ?Problem Relation Age of Onset  ? Sickle cell trait Mother   ? Sickle cell trait Father   ? Birth defects Maternal Grandmother   ? Birth defects Paternal Grandmother   ? ? ? Home Medications:  ? ?Prior to Admission medications   ?Medication Sig Start Date End Date  Taking? Authorizing Provider  ?abacavir-dolutegravir-lamiVUDine (TRIUMEQ) 600-50-300 MG tablet Take 1 tablet by mouth at bedtime.   Yes [provider]  ?ALPRAZolam Prudy Feeler) 1 MG tablet Take 1 mg by mouth 2 (two) times daily as needed for anxiety.   Yes [provider]  ?apixaban (ELIQUIS) 5 MG TABS tablet Take 5 mg by mouth 2 (two) times daily.   Yes [provider]  ?cetirizine (ZYRTEC) 10 MG tablet Take 10 mg by mouth daily as needed for allergies.   Yes [provider]  ?fluconazole (DIFLUCAN) 100 MG tablet Take 1 tablet (100 mg total) by mouth daily for 14 days. 07/29/21 08/12/21 Yes Rometta Emery, MD  ?hydroxyurea (HYDREA) 500 MG capsule Take 4 capsules (2,000 mg total) by mouth at bedtime. 10/07/20  Yes Massie Maroon, [name]  ?ibuprofen (ADVIL) 600 MG tablet Take 600 mg by mouth 3 (three) times daily as needed for pain. 05/20/21  Yes [provider]  ?melatonin 3 MG TABS tablet Take 6 mg by mouth at bedtime as needed (sleep). 03/04/21  Yes [provider]  ?Multiple Vitamin (MULTIVITAMIN WITH MINERALS) TABS tablet Take 1 tablet by mouth daily with breakfast.    Yes [provider]  ?oxyCODONE-acetaminophen (PERCOCET) 10-325 MG tablet Take 1 tablet by mouth every 4 (four) hours as needed for up to 15 days for pain. 08/02/21 08/17/21 Yes Quentin Angst, MD  ?cyclobenzaprine (FLEXERIL) 10 MG tablet Take 10 mg by mouth 3 (three) times daily as needed for muscle spasms. ?Patient not taking: Reported on 08/03/2021 03/04/21   [provider]  ?naloxone Jonelle Sports) nasal spray 4 mg/0.1 mL Place 4 mg into the nose as needed for opioid reversal. ?Patient not taking: Reported on 08/03/2021 05/19/17   [provider]  ?promethazine (PHENERGAN) 12.5 MG tablet Take 12.5 mg by mouth every 8 (eight) hours as needed for nausea/vomiting. ?Patient not taking: Reported on 08/03/2021 02/04/21   [provider]  ?SENEXON-S 8.6-50 MG tablet Take  1 tablet by mouth daily as needed for constipation. ?Patient not taking: Reported on 08/03/2021 05/20/21   [provider]  ? ? ? Allergies:  ? ?Allergies  ?Allergen Reactions  ? Ketorolac Tromethamine Swelling and Other (See Comments)  ?  Patient reports facial edema and left arm edema after administration.  ? Tape Rash and Other (See Comments)  ?  PLEASE DO NOT USE THE CLEAR, THICK, "PLASTIC" TAPE- only paper tape is tolerated   ? Wound Dressing Adhesive Rash  ? ? ? Physical Exam:  ? ?Vitals:  ? ?Vitals:  ? 08/03/21 0800 08/03/21 0830  ?BP: 117/79 113/80  ?Pulse: 71 69  ?Resp: 14 14  ?Temp:    ?SpO2: 92% 92%  ? ? ?Physical Exam: ?Constitutional: Patient appears well-developed and well-nourished. Not in obvious distress. ?HENT: Normocephalic, atraumatic, External right and left ear normal. Oropharynx is clear and  moist.  ?Eyes: Conjunctivae and EOM are normal. PERRLA, no scleral icterus. ?Neck: Normal ROM. Neck supple. No JVD. No tracheal deviation. No thyromegaly. ?CVS: RRR, S1/S2 +, no murmurs, no gallops, no carotid bruit.  ?Pulmonary: Effort and breath sounds normal, no stridor, rhonchi, wheezes, rales.  ?Abdominal: Soft. BS +, no distension, tenderness, rebound or guarding.  ?Musculoskeletal: Normal range of motion. No edema and no tenderness.  ?Lymphadenopathy: No lymphadenopathy noted, cervical, inguinal or axillary ?Neuro: Alert. Normal reflexes, muscle tone coordination. No cranial nerve deficit. ?Skin: Skin is warm and dry. No rash noted. Not diaphoretic. No erythema. No pallor. ?Psychiatric: Normal mood and affect. Behavior, judgment, thought content normal. ? ? Data Review:  ? ?CBC ?Recent Labs  ?Lab 07/28/21 ?0610 07/29/21 ?1024 08/03/21 ?0706  ?WBC 16.4* 11.0* 11.0*  ?HGB 7.9* 8.1* 8.1*  ?HCT 20.8* 22.2* 21.6*  ?PLT 260 278 370  ?MCV 106.1* 103.7* 102.4*  ?MCH 40.3* 37.9* 38.4*  ?MCHC 38.0* 36.5* 37.5*  ?RDW 25.3* 22.5* 25.2*  ?LYMPHSABS 6.7* 6.9* 5.2*  ?MONOABS 0.9 0.8 1.1*  ?EOSABS 0.2 0.3  0.2  ?BASOSABS 0.1 0.0 0.0  ? ?------------------------------------------------------------------------------------------------------------------ ? ?Chemistries  ?Recent Labs  ?Lab 07/28/21 ?0610 08/03/21 ?[id number]

## 2021-08-03 NOTE — ED Triage Notes (Signed)
SCC pain for about 2 days. Worst in lower back and hips. Took pain medications at home with no improvement. 02 78% ra on arrival.  ?

## 2021-08-03 NOTE — Progress Notes (Addendum)
Pt states he has a family emergency and needs to leave AMA.  He states a family member is in critical condition in [place] and he needs to go.  We discussed the risks of leaving which include the possibility of death and he voiced understanding stating that his health was important to him and he would come back for his treatment.  I did verify with him that once he leaves the hospital, he would need to go back to the ER for reassessment.  Dr. Toniann Fail made aware and AMA form signed by pt.  PAC deaccessed per protocol and Dilaudid PCA discontinued.   ?Hilton Sinclair BSN RN CMSRN ?08/03/2021, 10:12 PM ? ? ?AC Joe made aware of pt leaving AMA. ?Pt was ambulatory and in no distress upon leaving the unit.  He states he does have an oxygen tank with him in his vehicle that his friend is driving. ?Hilton Sinclair BSN RN CMSRN ?08/03/2021, 10:33 PM ? ?

## 2021-08-03 NOTE — ED Provider Notes (Signed)
?[facility]-EMERGENCY DEPT ?Provider Note ? ? ?CSN: [id number] ?Arrival date & time: 08/03/21  0419 ? ?  ? ?History ? ?Chief Complaint  ?Patient presents with  ? Sickle Cell Pain Crisis  ? ? ?Martin [name] is a 31 y.o. male. ? ?The history is provided by the patient and medical records.  ?Sickle Cell Pain Crisis ?Martin [name] is a 31 y.o. male who presents to the Emergency Department complaining of sickle cell pain crisis.  Who presents to the emergency department complaining of sickle cell pain crisis that started earlier today.  Pain is located his low back and bilateral hips.  He took his home Percocet without relief.  He does have oxygen, 3 L nasal cannula available as needed to use.  He has a history of PE on Eliquis, compliant with his medications.  He also has a history of HIV and is compliant with his medications as well.  He states that this pain today is similar to his prior pain crises.  Denies any fever, cough, shortness of breath, chest pain, Donnell pain, nausea, vomiting.  He was recently admitted for pain crisis and discharged home about 5 days ago.  He states that his pain was entirely resolved at the time of hospital discharge only to recur this evening. ?  ? ?Home Medications ?Prior to Admission medications   ?Medication Sig Start Date End Date Taking? Authorizing Provider  ?abacavir-dolutegravir-lamiVUDine (TRIUMEQ) 600-50-300 MG tablet Take 1 tablet by mouth at bedtime.   Yes [provider]  ?ALPRAZolam Prudy Feeler) 1 MG tablet Take 1 mg by mouth 2 (two) times daily as needed for anxiety.   Yes [provider]  ?apixaban (ELIQUIS) 5 MG TABS tablet Take 5 mg by mouth 2 (two) times daily.   Yes [provider]  ?cetirizine (ZYRTEC) 10 MG tablet Take 10 mg by mouth daily as needed for allergies.   Yes [provider]  ?fluconazole (DIFLUCAN) 100 MG tablet Take 1 tablet (100 mg total) by mouth daily for 14 days. 07/29/21 08/12/21 Yes  Rometta Emery, MD  ?hydroxyurea (HYDREA) 500 MG capsule Take 4 capsules (2,000 mg total) by mouth at bedtime. 10/07/20  Yes Massie Maroon, [name]  ?ibuprofen (ADVIL) 600 MG tablet Take 600 mg by mouth 3 (three) times daily as needed for pain. 05/20/21  Yes [provider]  ?melatonin 3 MG TABS tablet Take 6 mg by mouth at bedtime as needed (sleep). 03/04/21  Yes [provider]  ?Multiple Vitamin (MULTIVITAMIN WITH MINERALS) TABS tablet Take 1 tablet by mouth daily with breakfast.    Yes [provider]  ?oxyCODONE-acetaminophen (PERCOCET) 10-325 MG tablet Take 1 tablet by mouth every 4 (four) hours as needed for up to 15 days for pain. 08/02/21 08/17/21 Yes Quentin Angst, MD  ?cyclobenzaprine (FLEXERIL) 10 MG tablet Take 10 mg by mouth 3 (three) times daily as needed for muscle spasms. ?Patient not taking: Reported on 08/03/2021 03/04/21   [provider]  ?naloxone Jonelle Sports) nasal spray 4 mg/0.1 mL Place 4 mg into the nose as needed for opioid reversal. ?Patient not taking: Reported on 08/03/2021 05/19/17   [provider]  ?promethazine (PHENERGAN) 12.5 MG tablet Take 12.5 mg by mouth every 8 (eight) hours as needed for nausea/vomiting. ?Patient not taking: Reported on 08/03/2021 02/04/21   [provider]  ?SENEXON-S 8.6-50 MG tablet Take 1 tablet by mouth daily as needed for constipation. ?Patient not taking: Reported on 08/03/2021 05/20/21  [provider]  ?   ? ?Allergies    ?Ketorolac tromethamine, Tape, and Wound dressing adhesive   ? ?Review of Systems   ?Review of Systems  ?All other systems reviewed and are negative. ? ?Physical Exam ?Updated Vital Signs ?BP 110/68   Pulse 82   Temp 98.9 ?F (37.2 ?C) (Oral)   Resp 15   Ht 6\' 3"  (1.905 m)   Wt 68 kg   SpO2 92%   BMI 18.75 kg/m?  ?Physical Exam ?Vitals and nursing note reviewed.  ?Constitutional:   ?   Appearance: He is well-developed.  ?HENT:  ?   Head: Normocephalic and atraumatic.   ?Cardiovascular:  ?   Rate and Rhythm: Normal rate and regular rhythm.  ?   Heart sounds: No murmur heard. ?Pulmonary:  ?   Effort: Pulmonary effort is normal. No respiratory distress.  ?   Breath sounds: Normal breath sounds.  ?Abdominal:  ?   Palpations: Abdomen is soft.  ?   Tenderness: There is no abdominal tenderness. There is no guarding or rebound.  ?Musculoskeletal:     ?   General: No swelling or tenderness.  ?Skin: ?   General: Skin is warm and dry.  ?Neurological:  ?   Mental Status: He is alert and oriented to person, place, and time.  ?Psychiatric:     ?   Behavior: Behavior normal.  ? ? ?ED Results / Procedures / Treatments   ?Labs ?(all labs ordered are listed, but only abnormal results are displayed) ?Labs Reviewed  ?COMPREHENSIVE METABOLIC PANEL - Abnormal; Notable for the following components:  ?    Result Value  ? Calcium 8.8 (*)   ? Total Protein 8.5 (*)   ? AST 42 (*)   ? Total Bilirubin 7.4 (*)   ? All other components within normal limits  ?RESP PANEL BY RT-PCR (FLU A&B, COVID) ARPGX2  ?CBC WITH DIFFERENTIAL/PLATELET  ?CBC WITH DIFFERENTIAL/PLATELET  ? ? ?EKG ?None ? ?Radiology ?CT Angio Chest PE W/Cm &/Or Wo Cm ? ?Result Date: 08/03/2021 ?CLINICAL DATA:  31 year old male with history of sickle cell disease presenting with hypoxia. Evaluate for pulmonary embolism. EXAM: CT ANGIOGRAPHY CHEST WITH CONTRAST TECHNIQUE: Multidetector CT imaging of the chest was performed using the standard protocol during bolus administration of intravenous contrast. Multiplanar CT image reconstructions and MIPs were obtained to evaluate the vascular anatomy. RADIATION DOSE REDUCTION: This exam was performed according to the departmental dose-optimization program which includes automated exposure control, adjustment of the mA and/or kV according to patient size and/or use of iterative reconstruction technique. CONTRAST:  35mL OMNIPAQUE IOHEXOL 350 MG/ML SOLN COMPARISON:  Chest CT 05/27/2021. FINDINGS:  Cardiovascular: In the right lower lobe (axial image 174 of series 5) there is a nonocclusive filling defect, indicative of acute pulmonary embolism. Heart size is mildly enlarged. There is no significant pericardial fluid, thickening or pericardial calcification. No atherosclerotic calcifications are noted in the thoracic aorta or the coronary arteries. Right internal jugular single-lumen porta cath with tip terminating in the right atrium. Pulmonic trunk is dilated measuring 3.8 cm in diameter. Mediastinum/Nodes: No pathologically enlarged mediastinal or hilar lymph nodes. Several prominent but non pathologically enlarged mediastinal bilateral hilar lymph nodes are again noted, chronic and unchanged. Esophagus is unremarkable in appearance. No axillary lymphadenopathy. Lungs/Pleura: Again noted are widespread areas of septal thickening, thickening of the peribronchovascular interstitium and regional areas of architectural distortion scattered throughout the lungs, similar to prior studies, most compatible with areas of chronic scarring in  the setting of known sickle cell disease. Today's study demonstrates new widespread but patchy areas of ground-glass attenuation scattered throughout the lungs bilaterally with worsening interlobular septal thickening, concerning for potential interstitial pulmonary edema and/or active inflammation/infection. No pleural effusions. No definite suspicious appearing pulmonary nodules or masses are noted. 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 Musculoskeletal: There are no aggressive appearing lytic or blastic lesions noted in the visualized portions of the skeleton. Review of the MIP images confirms the above findings. IMPRESSION: 1. Small nonocclusive segmental sized pulmonary embolism in the right lower lobe. 2. Worsening aeration in the lungs, with a nonspecific pattern which could be reflective of atypical infection such as  viral pneumonia, or could simply reflect worsening pulmonary edema (particularly in the setting of cardiomegaly). 3. Dilatation of the pulmonic trunk (3.8 cm in diameter), concerning for pulmonary arterial hypertension. Electr

## 2021-08-04 NOTE — Discharge Summary (Signed)
?Physician Discharge Summary  ?Martin [name] BDZ:[id number] DOB: May 30, 1990 DOA: 08/03/2021 ? ?PCP: Martin Dew, [name] ? ?Admit date: 08/03/2021 ? ?Discharge date: 08/03/2021 ? ?Discharge Diagnoses:  ?Principal Problem: ?  Sickle cell pain crisis ([place]) ?Active Problems: ?  Human immunodeficiency virus (HIV) disease ([place]) ?  Chronic, continuous use of opioids ?  Leukocytosis ?  Acute on chronic respiratory failure ([place]) ? ? ?Discharge Condition: Stable.  Left AGAINST MEDICAL ADVICE. ? ?Disposition:  ?Pt is discharged home in good condition and is to follow up with Martin Dew, [name] this week to have labs evaluated. [name] is instructed to increase activity slowly and balance with rest for the next few days, and use prescribed medication to complete treatment of pain ? ?Diet: Regular ?Wt Readings from Last 3 Encounters:  ?08/03/21 68 kg  ?07/28/21 63.5 kg  ?06/24/21 63.8 kg  ? ? ?History of present illness:  ?Martin [name]  is a 31 y.o. male with a medical history significant for sickle cell disease, chronic pain syndrome, opiate dependence and tolerance, history of HIV, history of PE on Eliquis, and history of anemia of chronic disease presents to the emergency department with complaints of of pain to low back and lower extremities.  This pain is consistent with his typical sickle cell crisis.  Patient has been taking his home Percocet consistently without very much relief.  Patient has not identified any palliative or provocative factors concerning current crisis.  He states that he has been taking all of his home medications consistently including hydroxyurea and Eliquis.  He denies headache, chest pain, shortness of breath, urinary symptoms, nausea, vomiting, or diarrhea.  Patient has had no sick contacts, recent travel, or known exposure to COVID-19. ? ?ER Course:  ?Vital signs show: BP 113/80   Pulse 69   Temp 98.9 ?F (37.2 ?C) (Oral)   Resp 14   Ht '6\' 3"'  (1.905 m)   Wt 68 kg    SpO2 92%   BMI 18.75 kg/m?  ? ?While in the ER, oxygen saturation decreased below 90%.  Oxygen has been as low as 87% on 4 L.  Patient is on home oxygen and typically uses at bedtime.  CT showed small nonocclusive segmental 5 pulmonary embolism in the right lower lobe.  Worsening aeration in the lungs, nonspecific pattern which could be reflective of atypical infection such as viral pneumonia, or could simply reflect worsening pulmonary edema particularly in the setting cardiomegaly.  Dilatation of the pulmonary trunk, concerning for pulmonary arterial hypertension.  Complete blood count shows WBCs 11, hemoglobin 8.1 g/dL, and platelets 370,000.  Complete metabolic panel shows total bilirubin elevated at 7.4, otherwise unremarkable. ?Patient's pain persists despite IV Dilaudid and IV fluids.  Patient thereby admitted to [facility] for sickle cell pain crisis. ? ? ?Hospital Course:  ?Patient was admitted earlier today for acute sickle cell pain crisis on assessment, patient was hemodynamically stable prior to leaving [place].   ?On review of notes, patient was educated on the dangers of leaving [place]  ? ? ?Discharge Exam: ?Vitals:  ? 08/03/21 1939 08/03/21 2230  ?BP: (!) 121/95   ?Pulse: 69   ?Resp: 14 16  ?Temp: 97.9 ?F (36.6 ?C)   ?SpO2: 96% 98%  ? ?Vitals:  ? 08/03/21 1456 08/03/21 1520 08/03/21 1939 08/03/21 2230  ?BP: 120/86  (!) 121/95   ?Pulse: 71  69   ?Resp: '14 14 14 16  ' ?Temp: 97.8 ?F (36.6 ?C)  97.9 ?F (36.6 ?C)   ?TempSrc: Oral  Oral   ?SpO2: 90% 91% 96% 98%  ?Weight:      ?Height:      ? ? ?Discharge Instructions ? ? ?Allergies as of 08/03/2021   ? ?   Reactions  ? Ketorolac Tromethamine Swelling, Other (See Comments)  ? Patient reports facial edema and left arm edema after administration.  ? Tape Rash, Other (See Comments)  ? PLEASE DO NOT USE THE CLEAR, THICK, "PLASTIC" TAPE- only paper tape is tolerated   ? Wound Dressing Adhesive Rash  ? ?  ? ?  ?Medication List  ?   ? ?ASK your doctor about these medications   ? ?ALPRAZolam 1 MG tablet ?Commonly known as: Duanne Moron ?Take 1 mg by mouth 2 (two) times daily as needed for anxiety. ?  ?apixaban 5 MG Tabs tablet ?Commonly known as: ELIQUIS ?Take 5 mg by mouth 2 (two) times daily. ?  ?cetirizine 10 MG tablet ?Commonly known as: ZYRTEC ?Take 10 mg by mouth daily as needed for allergies. ?  ?cyclobenzaprine 10 MG tablet ?Commonly known as: FLEXERIL ?Take 10 mg by mouth 3 (three) times daily as needed for muscle spasms. ?  ?fluconazole 100 MG tablet ?Commonly known as: Diflucan ?Take 1 tablet (100 mg total) by mouth daily for 14 days. ?  ?hydroxyurea 500 MG capsule ?Commonly known as: HYDREA ?Take 4 capsules (2,000 mg total) by mouth at bedtime. ?  ?ibuprofen 600 MG tablet ?Commonly known as: ADVIL ?Take 600 mg by mouth 3 (three) times daily as needed for pain. ?  ?melatonin 3 MG Tabs tablet ?Take 6 mg by mouth at bedtime as needed (sleep). ?  ?multivitamin with minerals Tabs tablet ?Take 1 tablet by mouth daily with breakfast. ?  ?naloxone 4 MG/0.1ML Liqd nasal spray kit ?Commonly known as: NARCAN ?Place 4 mg into the nose as needed for opioid reversal. ?  ?oxyCODONE-acetaminophen 10-325 MG tablet ?Commonly known as: Percocet ?Take 1 tablet by mouth every 4 (four) hours as needed for up to 15 days for pain. ?  ?promethazine 12.5 MG tablet ?Commonly known as: PHENERGAN ?Take 12.5 mg by mouth every 8 (eight) hours as needed for nausea/vomiting. ?  ?Senexon-S 8.6-50 MG tablet ?Generic drug: senna-docusate ?Take 1 tablet by mouth daily as needed for constipation. ?  ?Triumeq 600-50-300 MG tablet ?Generic drug: abacavir-dolutegravir-lamiVUDine ?Take 1 tablet by mouth at bedtime. ?  ? ?  ? ? ?The results of significant diagnostics from this hospitalization (including imaging, microbiology, ancillary and laboratory) are listed below for reference.   ? ?Significant Diagnostic Studies: ?DG Chest 2 View ? ?Result Date: 07/08/2021 ?CLINICAL DATA:   Chest pain, sickle cell crisis EXAM: CHEST - 2 VIEW COMPARISON:  Chest radiographs dated 06/06/2021. CT chest dated 05/27/2021. FINDINGS: Chronic interstitial markings, lower lung predominant. No focal consolidation. No pleural effusion or pneumothorax. The heart is normal in size. Right chest power port terminates at the cavoatrial junction. IMPRESSION: Chronic interstitial lung disease, unchanged. No evidence of acute cardiopulmonary disease. Electronically Signed   By: Julian Hy M.D.   On: 07/08/2021 00:31  ? ?CT Angio Chest PE W/Cm &/Or Wo Cm ? ?Result Date: 08/03/2021 ?CLINICAL DATA:  31 year old male with history of sickle cell disease presenting with hypoxia. Evaluate for pulmonary embolism. EXAM: CT ANGIOGRAPHY CHEST WITH CONTRAST TECHNIQUE: Multidetector CT imaging of the chest was performed using the standard protocol during bolus administration of intravenous contrast. Multiplanar CT image reconstructions and MIPs were obtained to evaluate the  vascular anatomy. RADIATION DOSE REDUCTION: This exam was performed according to the departmental dose-optimization program which includes automated exposure control, adjustment of the mA and/or kV according to patient size and/or use of iterative reconstruction technique. CONTRAST:  58m OMNIPAQUE IOHEXOL 350 MG/ML SOLN COMPARISON:  Chest CT 05/27/2021. FINDINGS: Cardiovascular: In the right lower lobe (axial image 174 of series 5) there is a nonocclusive filling defect, indicative of acute pulmonary embolism. Heart size is mildly enlarged. There is no significant pericardial fluid, thickening or pericardial calcification. No atherosclerotic calcifications are noted in the thoracic aorta or the coronary arteries. Right internal jugular single-lumen porta cath with tip terminating in the right atrium. Pulmonic trunk is dilated measuring 3.8 cm in diameter. Mediastinum/Nodes: No pathologically enlarged mediastinal or hilar lymph nodes. Several prominent but  non pathologically enlarged mediastinal bilateral hilar lymph nodes are again noted, chronic and unchanged. Esophagus is unremarkable in appearance. No axillary lymphadenopathy. Lungs/Pleura: Again noted are widesp

## 2021-08-11 ENCOUNTER — Emergency Department (HOSPITAL_COMMUNITY): Payer: Medicaid Other

## 2021-08-11 ENCOUNTER — Inpatient Hospital Stay (HOSPITAL_COMMUNITY)
Admission: EM | Admit: 2021-08-11 | Discharge: 2021-08-17 | DRG: 811 | Disposition: A | Discharge status: Home / Self Care | Payer: Medicaid Other | Attending: Internal Medicine | Admitting: Internal Medicine

## 2021-08-11 DIAGNOSIS — E559 Vitamin D deficiency, unspecified: Secondary | ICD-10-CM | POA: Diagnosis present

## 2021-08-11 DIAGNOSIS — D638 Anemia in other chronic diseases classified elsewhere: Secondary | ICD-10-CM | POA: Diagnosis present

## 2021-08-11 DIAGNOSIS — J9610 Chronic respiratory failure, unspecified whether with hypoxia or hypercapnia: Secondary | ICD-10-CM | POA: Diagnosis present

## 2021-08-11 DIAGNOSIS — B20 Human immunodeficiency virus [HIV] disease: Secondary | ICD-10-CM | POA: Diagnosis present

## 2021-08-11 DIAGNOSIS — Z832 Family history of diseases of the blood and blood-forming organs and certain disorders involving the immune mechanism: Secondary | ICD-10-CM

## 2021-08-11 DIAGNOSIS — J129 Viral pneumonia, unspecified: Secondary | ICD-10-CM | POA: Diagnosis present

## 2021-08-11 DIAGNOSIS — G8929 Other chronic pain: Secondary | ICD-10-CM

## 2021-08-11 DIAGNOSIS — J9621 Acute and chronic respiratory failure with hypoxia: Secondary | ICD-10-CM | POA: Diagnosis present

## 2021-08-11 DIAGNOSIS — D5700 Hb-SS disease with crisis, unspecified: Principal | ICD-10-CM | POA: Diagnosis present

## 2021-08-11 DIAGNOSIS — Z20822 Contact with and (suspected) exposure to covid-19: Secondary | ICD-10-CM | POA: Diagnosis present

## 2021-08-11 DIAGNOSIS — Z7901 Long term (current) use of anticoagulants: Secondary | ICD-10-CM | POA: Diagnosis not present

## 2021-08-11 DIAGNOSIS — Z86711 Personal history of pulmonary embolism: Secondary | ICD-10-CM | POA: Diagnosis present

## 2021-08-11 DIAGNOSIS — Z79899 Other long term (current) drug therapy: Secondary | ICD-10-CM | POA: Diagnosis not present

## 2021-08-11 DIAGNOSIS — F419 Anxiety disorder, unspecified: Secondary | ICD-10-CM | POA: Diagnosis present

## 2021-08-11 DIAGNOSIS — D571 Sickle-cell disease without crisis: Secondary | ICD-10-CM

## 2021-08-11 DIAGNOSIS — G894 Chronic pain syndrome: Secondary | ICD-10-CM | POA: Diagnosis present

## 2021-08-11 DIAGNOSIS — D72829 Elevated white blood cell count, unspecified: Secondary | ICD-10-CM | POA: Diagnosis present

## 2021-08-11 DIAGNOSIS — J9620 Acute and chronic respiratory failure, unspecified whether with hypoxia or hypercapnia: Secondary | ICD-10-CM | POA: Diagnosis present

## 2021-08-11 LAB — CBC WITH DIFFERENTIAL/PLATELET
Abs Immature Granulocytes: 0.06 10*3/uL (ref 0.00–0.07)
Basophils Absolute: 0 10*3/uL (ref 0.0–0.1)
Basophils Relative: 0 %
Eosinophils Absolute: 0.2 10*3/uL (ref 0.0–0.5)
Eosinophils Relative: 1 %
HCT: 23.7 % — ABNORMAL LOW (ref 39.0–52.0)
Hemoglobin: 9.1 g/dL — ABNORMAL LOW (ref 13.0–17.0)
Immature Granulocytes: 0 %
Lymphocytes Relative: 34 %
Lymphs Abs: 5.2 10*3/uL — ABNORMAL HIGH (ref 0.7–4.0)
MCH: 38.6 pg — ABNORMAL HIGH (ref 26.0–34.0)
MCHC: 38.4 g/dL — ABNORMAL HIGH (ref 30.0–36.0)
MCV: 100.4 fL — ABNORMAL HIGH (ref 80.0–100.0)
Monocytes Absolute: 1.7 10*3/uL — ABNORMAL HIGH (ref 0.1–1.0)
Monocytes Relative: 11 %
Neutro Abs: 7.9 10*3/uL — ABNORMAL HIGH (ref 1.7–7.7)
Neutrophils Relative %: 54 %
Platelets: 232 10*3/uL (ref 150–400)
RBC: 2.36 MIL/uL — ABNORMAL LOW (ref 4.22–5.81)
RDW: 22.6 % — ABNORMAL HIGH (ref 11.5–15.5)
WBC: 15 10*3/uL — ABNORMAL HIGH (ref 4.0–10.5)
nRBC: 4.9 % — ABNORMAL HIGH (ref 0.0–0.2)

## 2021-08-11 LAB — COMPREHENSIVE METABOLIC PANEL
ALT: 24 U/L (ref 0–44)
AST: 48 U/L — ABNORMAL HIGH (ref 15–41)
Albumin: 4.2 g/dL (ref 3.5–5.0)
Alkaline Phosphatase: 65 U/L (ref 38–126)
Anion gap: 7 (ref 5–15)
BUN: 13 mg/dL (ref 6–20)
CO2: 24 mmol/L (ref 22–32)
Calcium: 8.9 mg/dL (ref 8.9–10.3)
Chloride: 106 mmol/L (ref 98–111)
Creatinine, Ser: 0.98 mg/dL (ref 0.61–1.24)
GFR, Estimated: >60 mL/min (ref >60)
Glucose, Bld: 98 mg/dL (ref 70–99)
Potassium: 4.3 mmol/L (ref 3.5–5.1)
Sodium: 137 mmol/L (ref 135–145)
Total Bilirubin: 8.6 mg/dL — ABNORMAL HIGH (ref 0.3–1.2)
Total Protein: 9.5 g/dL — ABNORMAL HIGH (ref 6.5–8.1)

## 2021-08-11 LAB — RETICULOCYTES
Immature Retic Fract: 23.1 % — ABNORMAL HIGH (ref 2.3–15.9)
RBC.: 2.3 MIL/uL — ABNORMAL LOW (ref 4.22–5.81)
Retic Count, Absolute: 553 10*3/uL — ABNORMAL HIGH (ref 19.0–186.0)
Retic Ct Pct: 23.1 % — ABNORMAL HIGH (ref 0.4–3.1)

## 2021-08-11 LAB — RESP PANEL BY RT-PCR (FLU A&B, COVID) ARPGX2
Influenza A by PCR: NEGATIVE
Influenza B by PCR: NEGATIVE
SARS Coronavirus 2 by RT PCR: NEGATIVE

## 2021-08-11 MED ORDER — SODIUM CHLORIDE 0.45 % IV SOLN: INTRAVENOUS | Status: DC

## 2021-08-11 MED ORDER — HYDROXYUREA 500 MG PO CAPS
2000.0000 mg | ORAL_CAPSULE | Freq: Every day | ORAL | Status: DC
  Administered 2021-08-11 – 2021-08-16 (×6): 2000 mg via ORAL
  Filled 2021-08-11 (×6): qty 4

## 2021-08-11 MED ORDER — DIPHENHYDRAMINE HCL 25 MG PO CAPS
25.0000 mg | ORAL_CAPSULE | ORAL | Status: DC | PRN
  Administered 2021-08-11 – 2021-08-14 (×4): 25 mg via ORAL
  Filled 2021-08-11 (×4): qty 1

## 2021-08-11 MED ORDER — HYDROMORPHONE HCL 2 MG/ML IJ SOLN
2.0000 mg | INTRAMUSCULAR | Status: AC
  Administered 2021-08-11: 2 mg via INTRAVENOUS
  Filled 2021-08-11: qty 1

## 2021-08-11 MED ORDER — ABACAVIR-DOLUTEGRAVIR-LAMIVUD 600-50-300 MG PO TABS
1.0000 | ORAL_TABLET | Freq: Every day | ORAL | Status: DC
  Administered 2021-08-11 – 2021-08-16 (×6): 1 via ORAL
  Filled 2021-08-11 (×6): qty 1

## 2021-08-11 MED ORDER — NALOXONE HCL 0.4 MG/ML IJ SOLN: 0.4000 mg | INTRAMUSCULAR | Status: DC | PRN

## 2021-08-11 MED ORDER — SODIUM CHLORIDE 0.9% FLUSH: 9.0000 mL | INTRAVENOUS | Status: DC | PRN

## 2021-08-11 MED ORDER — APIXABAN 5 MG PO TABS
5.0000 mg | ORAL_TABLET | Freq: Two times a day (BID) | ORAL | Status: DC
  Administered 2021-08-11 – 2021-08-17 (×13): 5 mg via ORAL
  Filled 2021-08-11 (×13): qty 1

## 2021-08-11 MED ORDER — SENNOSIDES-DOCUSATE SODIUM 8.6-50 MG PO TABS
1.0000 | ORAL_TABLET | Freq: Two times a day (BID) | ORAL | Status: DC
  Administered 2021-08-11 – 2021-08-17 (×13): 1 via ORAL
  Filled 2021-08-11 (×13): qty 1

## 2021-08-11 MED ORDER — HYDROMORPHONE 1 MG/ML IV SOLN
INTRAVENOUS | Status: DC
  Administered 2021-08-11: 6 mg via INTRAVENOUS
  Administered 2021-08-11: 2.5 mg via INTRAVENOUS
  Administered 2021-08-11: 6 mg via INTRAVENOUS
  Administered 2021-08-12: 1 mg via INTRAVENOUS
  Administered 2021-08-12: 0.5 mg via INTRAVENOUS
  Administered 2021-08-12: 8 mg via INTRAVENOUS
  Administered 2021-08-12: 2 mg via INTRAVENOUS
  Administered 2021-08-12: 13 mg via INTRAVENOUS
  Administered 2021-08-13: 8.5 mg via INTRAVENOUS
  Administered 2021-08-13: 8 mg via INTRAVENOUS
  Administered 2021-08-13: 1 mg via INTRAVENOUS
  Administered 2021-08-13: 8 mg via INTRAVENOUS
  Administered 2021-08-13: 4.5 mg via INTRAVENOUS
  Administered 2021-08-13: 30 mg via INTRAVENOUS
  Administered 2021-08-13: 2 mg via INTRAVENOUS
  Administered 2021-08-14: 8 mg via INTRAVENOUS
  Administered 2021-08-14: 30 mg via INTRAVENOUS
  Administered 2021-08-14: 14 mg via INTRAVENOUS
  Administered 2021-08-14 (×2): 5.5 mg via INTRAVENOUS
  Administered 2021-08-14: 13.1 mg via INTRAVENOUS
  Administered 2021-08-14: 5 mg via INTRAVENOUS
  Administered 2021-08-14: 30 mg via INTRAVENOUS
  Administered 2021-08-15: 1.5 mg via INTRAVENOUS
  Administered 2021-08-15: 2 mg via INTRAVENOUS
  Administered 2021-08-15: 2.5 mg via INTRAVENOUS
  Administered 2021-08-15: 1 mg via INTRAVENOUS
  Administered 2021-08-15: 6 mg via INTRAVENOUS
  Administered 2021-08-15: 30 mg via INTRAVENOUS
  Administered 2021-08-16: 7 mg via INTRAVENOUS
  Administered 2021-08-16: 2.5 mg via INTRAVENOUS
  Administered 2021-08-16: 9 mg via INTRAVENOUS
  Administered 2021-08-16: 30 mg via INTRAVENOUS
  Administered 2021-08-16: 9 mg via INTRAVENOUS
  Administered 2021-08-16: 4.5 mg via INTRAVENOUS
  Administered 2021-08-17: 5.5 mg via INTRAVENOUS
  Administered 2021-08-17: 7.5 mg via INTRAVENOUS
  Administered 2021-08-17: 9.5 mg via INTRAVENOUS
  Administered 2021-08-17: 1 mg via INTRAVENOUS
  Filled 2021-08-11 (×7): qty 30

## 2021-08-11 MED ORDER — MELATONIN 3 MG PO TABS: 6.0000 mg | ORAL_TABLET | Freq: Every evening | ORAL | Status: DC | PRN

## 2021-08-11 MED ORDER — HYDROMORPHONE HCL 2 MG/ML IJ SOLN
2.0000 mg | Freq: Once | INTRAMUSCULAR | Status: AC
  Administered 2021-08-11: 2 mg via INTRAVENOUS
  Filled 2021-08-11: qty 1

## 2021-08-11 MED ORDER — POLYETHYLENE GLYCOL 3350 17 G PO PACK: 17.0000 g | PACK | Freq: Every day | ORAL | Status: DC | PRN

## 2021-08-11 MED ORDER — ONDANSETRON HCL 4 MG/2ML IJ SOLN
4.0000 mg | INTRAMUSCULAR | Status: DC | PRN
  Administered 2021-08-17: 4 mg via INTRAVENOUS
  Filled 2021-08-11: qty 2

## 2021-08-11 NOTE — ED Triage Notes (Signed)
SCC recently discharged home. Pt states the pain never really  went away. Pain in lower back, hips and chest ?

## 2021-08-11 NOTE — H&P (Signed)
H&P ? Patient Demographics:  ?Martin [name], is a 31 y.o. male  MRN: [id number]   DOB - 1990/09/15 ? ?Admit Date - 08/11/2021 ? ?Outpatient Primary MD for the patient is Massie Maroon, [name] ? ?Chief Complaint  ?Patient presents with  ? Sickle Cell Pain Crisis  ?  ? ? HPI:  ? ?Martin [name]  is a 31 y.o. male with a medical history significant for sickle cell disease, chronic pain syndrome, opiate dependence and tolerance, chronic respiratory failure with hypoxia, history of HIV, history of PE on Eliquis, and history of anemia of chronic disease presents to the emergency department with complaints of chest pain, bilateral lower extremity, and low back pain that is consistent with his previous sickle cell pain crisis.  Patient was recently admitted on 08/03/2021 for this problem and left AGAINST MEDICAL ADVICE due to a family emergency.  He states that pain intensity has been progressively worsening over the past several days.  Patient states that pain to his chest is sharp and nonradiating, he has been taking Percocet 10-325 mg every 4 hours without any relief.  Patient denies any dizziness, shortness of breath, urinary symptoms, nausea.  No vomiting, diarrhea, or constipation.  No fever or chills.  Patient is on home oxygen at 2 L. ? ?ER course: ?Vital signs show temperature 98.2 ?F, BP 103/79, pulse rate 80, respirations 17, and oxygen saturation 96% on 2 L..  Complete blood count shows WBCs 15, hemoglobin 9.1 g/dL, and platelets 283,151.  Complete metabolic panel shows bilirubin elevated at 8.6, otherwise unremarkable.  Chest x-ray shows no acute cardiopulmonary process.  COVID-19 and influenza negative.  Patient's pain persists despite IV Dilaudid and IV fluids.  Admit for further management of sickle cell pain crisis. ? ? Review of systems:  ?Review of Systems  ?Constitutional:  Negative for chills and fever.  ?HENT: Negative.    ?Eyes: Negative.   ?Respiratory:  Negative for cough and shortness of  breath.   ?Cardiovascular:  Positive for chest pain.  ?Gastrointestinal: Negative.   ?Genitourinary: Negative.   ?Musculoskeletal:  Positive for back pain and joint pain.  ?Skin: Negative.   ?Neurological: Negative.   ?Endo/Heme/Allergies: Negative.   ?Psychiatric/Behavioral: Negative.    ? ?With Past History of the following :  ? ?Past Medical History:  ?Diagnosis Date  ? Anxiety   ? HIV (human immunodeficiency virus infection) (HCC)   ? Proteinuria   ? 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[facility])   ? Vitamin D deficiency 10/2018  ?   ? ?Past Surgical History:  ?Procedure Laterality Date  ? IR IMAGING GUIDED PORT INSERTION  08/29/2019  ? IR REMOVAL TUN ACCESS W/ PORT W/O FL MOD SED  08/30/2020  ? TEE WITHOUT CARDIOVERSION N/A 08/30/2020  ? Procedure: TRANSESOPHAGEAL ECHOCARDIOGRAM (TEE);  Surgeon: [name], MD;  Location: [facility] ENDOSCOPY;  Service: Cardiovascular;  Laterality: N/A;  ? ? ? Social History:  ? ?Social History  ? ?Tobacco Use  ? Smoking status: Never  ? Smokeless tobacco: Never  ?Substance Use Topics  ? Alcohol use: No  ?  ? ?Lives - At home ? ? Family History :  ? ?Family History  ?Problem Relation Age of Onset  ? Sickle cell trait Mother   ? Sickle cell trait Father   ? Birth defects Maternal Grandmother   ? Birth defects Paternal Grandmother   ? ? ? Home Medications:  ? ?Prior to Admission medications   ?Medication Sig Start Date End Date Taking? Authorizing Provider  ?abacavir-dolutegravir-lamiVUDine (  TRIUMEQ) 600-50-300 MG tablet Take 1 tablet by mouth at bedtime.    [provider]  ?ALPRAZolam Prudy Feeler(XANAX) 1 MG tablet Take 1 mg by mouth 2 (two) times daily as needed for anxiety.    [provider]  ?apixaban (ELIQUIS) 5 MG TABS tablet Take 5 mg by mouth 2 (two) times daily.    [provider]  ?cetirizine (ZYRTEC) 10 MG tablet Take 10 mg by mouth daily as needed for allergies.    [provider]  ?cyclobenzaprine (FLEXERIL) 10 MG tablet Take 10 mg by mouth 3 (three) times daily as  needed for muscle spasms. ?Patient not taking: Reported on 08/03/2021 03/04/21   [provider]  ?hydroxyurea (HYDREA) 500 MG capsule Take 4 capsules (2,000 mg total) by mouth at bedtime. 10/07/20   Massie MaroonHollis, Kolbi Altadonna M, [name]  ?ibuprofen (ADVIL) 600 MG tablet Take 600 mg by mouth 3 (three) times daily as needed for pain. 05/20/21   [provider]  ?melatonin 3 MG TABS tablet Take 6 mg by mouth at bedtime as needed (sleep). 03/04/21   [provider]  ?Multiple Vitamin (MULTIVITAMIN WITH MINERALS) TABS tablet Take 1 tablet by mouth daily with breakfast.     [provider]  ?naloxone (NARCAN) nasal spray 4 mg/0.1 mL Place 4 mg into the nose as needed for opioid reversal. ?Patient not taking: Reported on 08/03/2021 05/19/17   [provider]  ?oxyCODONE-acetaminophen (PERCOCET) 10-325 MG tablet Take 1 tablet by mouth every 4 (four) hours as needed for up to 15 days for pain. 08/02/21 08/17/21  Quentin AngstJegede, Olugbemiga E, MD  ?promethazine (PHENERGAN) 12.5 MG tablet Take 12.5 mg by mouth every 8 (eight) hours as needed for nausea/vomiting. ?Patient not taking: Reported on 08/03/2021 02/04/21   [provider]  ?SENEXON-S 8.6-50 MG tablet Take 1 tablet by mouth daily as needed for constipation. ?Patient not taking: Reported on 08/03/2021 05/20/21   [provider]  ? ? ? Allergies:  ? ?Allergies  ?Allergen Reactions  ? Ketorolac Tromethamine Swelling and Other (See Comments)  ?  Patient reports facial edema and left arm edema after administration.  ? Tape Rash and Other (See Comments)  ?  PLEASE DO NOT USE THE CLEAR, THICK, "PLASTIC" TAPE- only paper tape is tolerated   ? Wound Dressing Adhesive Rash  ? ? ? Physical Exam:  ? ?Vitals:  ? ?Vitals:  ? 08/12/21 0430 08/12/21 0544  ?BP:  101/62  ?Pulse:  81  ?Resp: 15 14  ?Temp:  (!) 97.5 ?F (36.4 ?C)  ?SpO2: 92% 94%  ? ? ?Physical Exam: ?Constitutional: Patient appears well-developed and well-nourished. Not in obvious  distress. ?HENT: Normocephalic, atraumatic, External right and left ear normal. Oropharynx is clear and moist.  ?Eyes: Conjunctivae and EOM are normal. PERRLA, no scleral icterus. ?Neck: Normal ROM. Neck supple. No JVD. No tracheal deviation. No thyromegaly. ?CVS: RRR, S1/S2 +, no murmurs, no gallops, no carotid bruit.  ?Pulmonary: Effort and breath sounds normal, no stridor, rhonchi, wheezes, rales.  ?Abdominal: Soft. BS +, no distension, tenderness, rebound or guarding.  ?Musculoskeletal: Normal range of motion. No edema and no tenderness.  ?Lymphadenopathy: No lymphadenopathy noted, cervical, inguinal or axillary ?Neuro: Alert. Normal reflexes, muscle tone coordination. No cranial nerve deficit. ?Skin: Skin is warm and dry. No rash noted. Not diaphoretic. No erythema. No pallor. ?Psychiatric: Normal mood and affect. Behavior, judgment, thought content normal. ? ? Data Review:  ? ?CBC ?Recent Labs  ?Lab 08/11/21 ?[id number]  ?WBC 15.0*  ?HGB  9.1*  ?HCT 23.7*  ?PLT 232  ?MCV 100.4*  ?MCH 38.6*  ?MCHC 38.4*  ?RDW 22.6*  ?LYMPHSABS 5.2*  ?MONOABS 1.7*  ?EOSABS 0.2  ?BASOSABS 0.0  ? ?------------------------------------------------------------------------------------------------------------------ ? ?Chemistries  ?Recent Labs  ?Lab 08/11/21 ?[id number]  ?NA 137  ?K 4.3  ?CL 106  ?CO2 24  ?GLUCOSE 98  ?BUN 13  ?CREATININE 0.98  ?CALCIUM 8.9  ?AST 48*  ?ALT 24  ?ALKPHOS 65  ?BILITOT 8.6*  ? ?------------------------------------------------------------------------------------------------------------------ ?estimated creatinine clearance is 105 mL/min (by C-G formula based on SCr of 0.98 mg/dL). ?------------------------------------------------------------------------------------------------------------------ ?No results for input(s): TSH, T4TOTAL, T3FREE, THYROIDAB in the last 72 hours. ? ?Invalid input(s): FREET3 ? ?Coagulation profile ?No results for input(s): INR, PROTIME in the last 168  hours. ?------------------------------------------------------------------------------------------------------------------- ?No results for input(s): DDIMER in the last 72 hours. ?--------------------------------------------------------------------------------

## 2021-08-11 NOTE — ED Provider Notes (Signed)
?[facility]-EMERGENCY DEPT ?Provider Note ? ? ?CSN: [id number] ?Arrival date & time: 08/11/21  5009 ? ?  ? ?History ? ?Chief Complaint  ?Patient presents with  ? Sickle Cell Pain Crisis  ? ? ?Devean X Dettmann is a 31 y.o. male. ? ?The history is provided by the patient and medical records.  ?Sickle Cell Pain Crisis ?Associated symptoms: chest pain   ? ?31 year old male with history of HIV, sickle cell anemia, presenting to the ED with pain crisis.  States ongoing for the past week.  Endorses pain in chest, back, and legs.  Chest pain is atypical, he was recently admitted for acute chest syndrome on 08/03/2021.  He denies any shortness of breath or fever.  He is chronically anticoagulated with Eliquis, has been compliant. ? ?Home Medications ?Prior to Admission medications   ?Medication Sig Start Date End Date Taking? Authorizing Provider  ?abacavir-dolutegravir-lamiVUDine (TRIUMEQ) 600-50-300 MG tablet Take 1 tablet by mouth at bedtime.    [provider]  ?ALPRAZolam Prudy Feeler) 1 MG tablet Take 1 mg by mouth 2 (two) times daily as needed for anxiety.    [provider]  ?apixaban (ELIQUIS) 5 MG TABS tablet Take 5 mg by mouth 2 (two) times daily.    [provider]  ?cetirizine (ZYRTEC) 10 MG tablet Take 10 mg by mouth daily as needed for allergies.    [provider]  ?cyclobenzaprine (FLEXERIL) 10 MG tablet Take 10 mg by mouth 3 (three) times daily as needed for muscle spasms. ?Patient not taking: Reported on 08/03/2021 03/04/21   [provider]  ?fluconazole (DIFLUCAN) 100 MG tablet Take 1 tablet (100 mg total) by mouth daily for 14 days. 07/29/21 08/12/21  Rometta Emery, MD  ?hydroxyurea (HYDREA) 500 MG capsule Take 4 capsules (2,000 mg total) by mouth at bedtime. 10/07/20   Massie Maroon, [name]  ?ibuprofen (ADVIL) 600 MG tablet Take 600 mg by mouth 3 (three) times daily as needed for pain. 05/20/21   [provider]  ?melatonin 3 MG TABS  tablet Take 6 mg by mouth at bedtime as needed (sleep). 03/04/21   [provider]  ?Multiple Vitamin (MULTIVITAMIN WITH MINERALS) TABS tablet Take 1 tablet by mouth daily with breakfast.     [provider]  ?naloxone (NARCAN) nasal spray 4 mg/0.1 mL Place 4 mg into the nose as needed for opioid reversal. ?Patient not taking: Reported on 08/03/2021 05/19/17   [provider]  ?oxyCODONE-acetaminophen (PERCOCET) 10-325 MG tablet Take 1 tablet by mouth every 4 (four) hours as needed for up to 15 days for pain. 08/02/21 08/17/21  Quentin Angst, MD  ?promethazine (PHENERGAN) 12.5 MG tablet Take 12.5 mg by mouth every 8 (eight) hours as needed for nausea/vomiting. ?Patient not taking: Reported on 08/03/2021 02/04/21   [provider]  ?SENEXON-S 8.6-50 MG tablet Take 1 tablet by mouth daily as needed for constipation. ?Patient not taking: Reported on 08/03/2021 05/20/21   [provider]  ?   ? ?Allergies    ?Ketorolac tromethamine, Tape, and Wound dressing adhesive   ? ?Review of Systems   ?Review of Systems  ?Cardiovascular:  Positive for chest pain.  ?Musculoskeletal:  Positive for arthralgias.  ?All other systems reviewed and are negative. ? ?Physical Exam ?Updated Vital Signs ?BP 115/79   Pulse 92   Temp 98.4 ?F (36.9 ?C) (Oral)   Resp 18   Ht 6\' 3"  (1.905 m)   Wt 68 kg   SpO2 91%  BMI 18.75 kg/m?  ? ?Physical Exam ?Vitals and nursing note reviewed.  ?Constitutional:   ?   Appearance: He is well-developed.  ?HENT:  ?   Head: Normocephalic and atraumatic.  ?Eyes:  ?   Conjunctiva/sclera: Conjunctivae normal.  ?   Pupils: Pupils are equal, round, and reactive to light.  ?Cardiovascular:  ?   Rate and Rhythm: Normal rate and regular rhythm.  ?   Heart sounds: Normal heart sounds.  ?Pulmonary:  ?   Effort: Pulmonary effort is normal. No respiratory distress.  ?   Breath sounds: Normal breath sounds. No rhonchi.  ?Abdominal:  ?   General: Bowel sounds are normal.  ?    Palpations: Abdomen is soft.  ?   Tenderness: There is no abdominal tenderness. There is no rebound.  ?Musculoskeletal:     ?   General: Normal range of motion.  ?   Cervical back: Normal range of motion.  ?Skin: ?   General: Skin is warm and dry.  ?Neurological:  ?   Mental Status: He is alert and oriented to person, place, and time.  ? ? ?ED Results / Procedures / Treatments   ?Labs ?(all labs ordered are listed, but only abnormal results are displayed) ?Labs Reviewed  ?COMPREHENSIVE METABOLIC PANEL - Abnormal; Notable for the following components:  ?    Result Value  ? Total Protein 9.5 (*)   ? AST 48 (*)   ? Total Bilirubin 8.6 (*)   ? All other components within normal limits  ?CBC WITH DIFFERENTIAL/PLATELET - Abnormal; Notable for the following components:  ? WBC 15.0 (*)   ? RBC 2.36 (*)   ? Hemoglobin 9.1 (*)   ? HCT 23.7 (*)   ? MCV 100.4 (*)   ? MCH 38.6 (*)   ? MCHC 38.4 (*)   ? RDW 22.6 (*)   ? nRBC 4.9 (*)   ? Neutro Abs 7.9 (*)   ? Lymphs Abs 5.2 (*)   ? Monocytes Absolute 1.7 (*)   ? All other components within normal limits  ?RETICULOCYTES - Abnormal; Notable for the following components:  ? Retic Ct Pct 23.1 (*)   ? RBC. 2.30 (*)   ? Retic Count, Absolute 553.0 (*)   ? Immature Retic Fract 23.1 (*)   ? All other components within normal limits  ? ? ?EKG ?None ? ?Radiology ?DG Chest Port 1 View ? ?Result Date: 08/11/2021 ?CLINICAL DATA:  Chest pain.  Sickle cell disease. EXAM: PORTABLE CHEST 1 VIEW COMPARISON:  08/03/2021 FINDINGS: 0444 hours. Cardiopericardial silhouette is at upper limits of normal for size. Lungs are hyperexpanded. Interval improvement in the interstitial prominence and ill-defined opacity seen previously at the lung bases. No pneumothorax or pleural effusion. Right Port-A-Cath again noted. Telemetry leads overlie the chest. IMPRESSION: Interval improvement in interstitial prominence and ill-defined bibasilar airspace disease. Electronically Signed   By: Kennith CenterEric  Mansell M.D.   On:  08/11/2021 05:12   ? ?Procedures ?Procedures  ? ? ?CRITICAL CARE ?Performed by: Garlon HatchetLisa M Deysi Soldo ? ? ?Total critical care time: 45 minutes ? ?Critical care time was exclusive of separately billable procedures and treating other patients. ? ?Critical care was necessary to treat or prevent imminent or life-threatening deterioration. ? ?Critical care was time spent personally by me on the following activities: development of treatment plan with patient and/or surrogate as well as nursing, discussions with consultants, evaluation of patient's response to treatment, examination of patient, obtaining history from patient or surrogate, ordering  and performing treatments and interventions, ordering and review of laboratory studies, ordering and review of radiographic studies, pulse oximetry and re-evaluation of patient's condition. ? ? ?Medications Ordered in ED ?Medications  ?0.45 % sodium chloride infusion ( Intravenous New Bag/Given 08/11/21 0449)  ?diphenhydrAMINE (BENADRYL) capsule 25 mg (25 mg Oral Given 08/11/21 0454)  ?ondansetron (ZOFRAN) injection 4 mg (has no administration in time range)  ?HYDROmorphone (DILAUDID) injection 2 mg (has no administration in time range)  ?HYDROmorphone (DILAUDID) injection 2 mg (2 mg Intravenous Given 08/11/21 0451)  ?HYDROmorphone (DILAUDID) injection 2 mg (2 mg Intravenous Given 08/11/21 0532)  ? ? ?ED Course/ Medical Decision Making/ A&P ?  ?                        ?Medical Decision Making ?Amount and/or Complexity of Data Reviewed ?Labs: ordered. ?Radiology: ordered. ? ?Risk ?Prescription drug management. ?Decision regarding hospitalization. ? ? ?31 y.o. M here with sickle cell pain crisis.  Admitted earlier this month for acute chest.  Reports pain in back, legs, and chest.  Denies SOB, cough, or fever.  Afebrile, non-toxic.  Sats margin at 91% but he does not have home O2 (non-compliant with such).  Will check labs, obtain CXR.  Given IVF, pain control. ? ?5:58 AM ?Patient has had 2  rounds of pain medication with minimal improvement.  Still rates pain 8/10.  Labs today with increased bili but stable hemoglobin.  Retic counts appears around his baseline.  CXR with improvement in prior fi

## 2021-08-12 ENCOUNTER — Other Ambulatory Visit: Payer: Self-pay

## 2021-08-12 ENCOUNTER — Encounter (HOSPITAL_COMMUNITY): Payer: Self-pay | Admitting: Family Medicine

## 2021-08-12 ENCOUNTER — Inpatient Hospital Stay (HOSPITAL_COMMUNITY): Payer: Medicaid Other

## 2021-08-12 DIAGNOSIS — D5700 Hb-SS disease with crisis, unspecified: Secondary | ICD-10-CM | POA: Diagnosis not present

## 2021-08-12 LAB — BASIC METABOLIC PANEL
Anion gap: 6 (ref 5–15)
BUN: 13 mg/dL (ref 6–20)
CO2: 25 mmol/L (ref 22–32)
Calcium: 8.5 mg/dL — ABNORMAL LOW (ref 8.9–10.3)
Chloride: 107 mmol/L (ref 98–111)
Creatinine, Ser: 0.69 mg/dL (ref 0.61–1.24)
GFR, Estimated: >60 mL/min (ref >60)
Glucose, Bld: 93 mg/dL (ref 70–99)
Potassium: 4.4 mmol/L (ref 3.5–5.1)
Sodium: 138 mmol/L (ref 135–145)

## 2021-08-12 LAB — CBC
HCT: 21.8 % — ABNORMAL LOW (ref 39.0–52.0)
Hemoglobin: 8.2 g/dL — ABNORMAL LOW (ref 13.0–17.0)
MCH: 38.1 pg — ABNORMAL HIGH (ref 26.0–34.0)
MCHC: 37.6 g/dL — ABNORMAL HIGH (ref 30.0–36.0)
MCV: 101.4 fL — ABNORMAL HIGH (ref 80.0–100.0)
Platelets: 223 10*3/uL (ref 150–400)
RBC: 2.15 MIL/uL — ABNORMAL LOW (ref 4.22–5.81)
RDW: 21.9 % — ABNORMAL HIGH (ref 11.5–15.5)
WBC: 10.5 10*3/uL (ref 4.0–10.5)
nRBC: 5.7 % — ABNORMAL HIGH (ref 0.0–0.2)

## 2021-08-12 MED ORDER — SODIUM CHLORIDE 0.9 % IV SOLN
500.0000 mg | INTRAVENOUS | Status: AC
  Administered 2021-08-12 – 2021-08-14 (×3): 500 mg via INTRAVENOUS
  Filled 2021-08-12 (×3): qty 5

## 2021-08-12 MED ORDER — LIP MEDEX EX OINT
1.0000 "application " | TOPICAL_OINTMENT | CUTANEOUS | Status: DC | PRN
  Administered 2021-08-12: 1 via TOPICAL
  Filled 2021-08-12: qty 7

## 2021-08-12 MED ORDER — HYDROCORTISONE 1 % EX CREA
TOPICAL_CREAM | CUTANEOUS | Status: DC | PRN
  Filled 2021-08-12: qty 28

## 2021-08-12 MED ORDER — ALPRAZOLAM 1 MG PO TABS
1.0000 mg | ORAL_TABLET | Freq: Once | ORAL | Status: AC | PRN
  Administered 2021-08-12: 1 mg via ORAL
  Filled 2021-08-12: qty 1

## 2021-08-12 MED ORDER — SODIUM CHLORIDE 0.9 % IV SOLN
12.5000 mg | Freq: Once | INTRAVENOUS | Status: AC
  Administered 2021-08-12: 12.5 mg via INTRAVENOUS
  Filled 2021-08-12: qty 12.5

## 2021-08-12 MED ORDER — ALPRAZOLAM 0.5 MG PO TABS
0.5000 mg | ORAL_TABLET | Freq: Two times a day (BID) | ORAL | Status: AC | PRN
  Administered 2021-08-12: 0.5 mg via ORAL
  Filled 2021-08-12: qty 1

## 2021-08-12 MED ORDER — SODIUM CHLORIDE 0.9 % IV SOLN
1.0000 g | INTRAVENOUS | Status: DC
  Administered 2021-08-12 – 2021-08-16 (×5): 1 g via INTRAVENOUS
  Filled 2021-08-12 (×5): qty 10

## 2021-08-12 NOTE — Progress Notes (Incomplete Revision)
Subjective: ?Martin [name] is a 31 year old male with a medical history significant for sickle cell disease, chronic pain syndrome, history of HIV, history of pulmonary embolism on Eliquis, and anemia of chronic disease was admitted for sickle cell crisis. ? ?Today, patient has an oxygen requirement of 4 L/min which is increased from home oxygen.  Patient denies shortness of breath.  He endorses fatigue.  No fever, chills, urinary symptoms, nausea, vomiting, or diarrhea. ?Objective: ? ?Vital signs in last 24 hours: ? ?Vitals:  ? 08/12/21 0544 08/12/21 0744 08/12/21 1003 08/12/21 1052  ?BP: 101/62  104/64   ?Pulse: 81  73   ?Resp: 14 16 16 16   ?Temp: (!) 97.5 ?F (36.4 ?C)  97.9 ?F (36.6 ?C)   ?TempSrc: Oral  Oral   ?SpO2: 94% 96% 96% 96%  ?Weight:      ?Height:      ? ? ?Intake/Output from previous day: ? ? ?Intake/Output Summary (Last 24 hours) at 08/12/2021 1422 ?Last data filed at 08/12/2021 0900 ?Gross per 24 hour  ?Intake 1524.15 ml  ?Output 2100 ml  ?Net -575.85 ml  ? ? ?Physical Exam: ?General: Alert, awake, oriented x3, in no acute distress.  ?HEENT: [place]/AT PEERL, EOMI ?Neck: Trachea midline,  no masses, no thyromegal,y no JVD, no carotid bruit ?OROPHARYNX:  Moist, No exudate/ erythema/lesions.  ?Heart: Regular rate and rhythm, without murmurs, rubs, gallops, PMI non-displaced, no heaves or thrills on palpation.  ?Lungs: Clear to auscultation, no wheezing or rhonchi noted. No increased vocal fremitus resonant to percussion  ?Abdomen: Soft, nontender, nondistended, positive bowel sounds, no masses no hepatosplenomegaly noted.Marland Kitchen.  ?Neuro: No focal neurological deficits noted cranial nerves II through XII grossly intact. DTRs 2+ bilaterally upper and lower extremities. Strength 5 out of 5 in bilateral upper and lower extremities. ?Musculoskeletal: No warm swelling or erythema around joints, no spinal tenderness noted. ?Psychiatric: Patient alert and oriented x3, good insight and cognition, good recent to remote  recall. ?Lymph node survey: No cervical axillary or inguinal lymphadenopathy noted. ? ?Lab Results: ? ?Basic Metabolic Panel: ?   ?Component Value Date/Time  ? NA 138 08/12/2021 0519  ? NA 136 03/18/2021 1232  ? K 4.4 08/12/2021 0519  ? CL 107 08/12/2021 0519  ? CO2 25 08/12/2021 0519  ? BUN 13 08/12/2021 0519  ? BUN 11 03/18/2021 1232  ? CREATININE 0.69 08/12/2021 0519  ? GLUCOSE 93 08/12/2021 0519  ? CALCIUM 8.5 (L) 08/12/2021 0519  ? ?CBC: ?   ?Component Value Date/Time  ? WBC 10.5 08/12/2021 0519  ? HGB 8.2 (L) 08/12/2021 0519  ? HGB 8.3 (L) 03/18/2021 1232  ? HCT 21.8 (L) 08/12/2021 0519  ? HCT 22.8 (L) 03/18/2021 1232  ? PLT 223 08/12/2021 0519  ? PLT 361 03/18/2021 1232  ? MCV 101.4 (H) 08/12/2021 0519  ? MCV 111 (H) 03/18/2021 1232  ? NEUTROABS 7.9 (H) 08/11/2021 0445  ? NEUTROABS 6.3 03/18/2021 1232  ? LYMPHSABS 5.2 (H) 08/11/2021 0445  ? LYMPHSABS 6.1 (H) 03/18/2021 1232  ? MONOABS 1.7 (H) 08/11/2021 0445  ? EOSABS 0.2 08/11/2021 0445  ? EOSABS 0.2 03/18/2021 1232  ? BASOSABS 0.0 08/11/2021 0445  ? BASOSABS 0.1 03/18/2021 1232  ? ? ?Recent Results (from the past 240 hour(s))  ?Resp Panel by RT-PCR (Flu A&B, Covid) Nasopharyngeal Swab     Status: None  ? Collection Time: 08/03/21  7:04 AM  ? Specimen: Nasopharyngeal Swab; Nasopharyngeal(NP) swabs in vial transport medium  ?Result Value Ref Range Status  ?  SARS Coronavirus 2 by RT PCR NEGATIVE NEGATIVE Final  ?  Comment: (NOTE) ?SARS-CoV-2 target nucleic acids are NOT DETECTED. ? ?The SARS-CoV-2 RNA is generally detectable in upper respiratory ?specimens during the acute phase of infection. The lowest ?concentration of SARS-CoV-2 viral copies this assay can detect is ?138 copies/mL. A negative result does not preclude SARS-Cov-2 ?infection and should not be used as the sole basis for treatment or ?other patient management decisions. A negative result may occur with  ?improper specimen collection/handling, submission of specimen other ?than nasopharyngeal  swab, presence of viral mutation(s) within the ?areas targeted by this assay, and inadequate number of viral ?copies(<138 copies/mL). A negative result must be combined with ?clinical observations, patient history, and epidemiological ?information. The expected result is Negative. ? ?Fact Sheet for Patients:  ?BloggerCourse.com ? ?Fact Sheet for Healthcare Providers:  ?SeriousBroker.it ? ?This test is no t yet approved or cleared by the [place] FDA and  ?has been authorized for detection and/or diagnosis of SARS-CoV-2 by ?FDA under an Emergency Use Authorization (EUA). This EUA will remain  ?in effect (meaning this test can be used) for the duration of the ?COVID-19 declaration under Section 564(b)(1) of the Act, 21 ?U.S.C.section 360bbb-3(b)(1), unless the authorization is terminated  ?or revoked sooner.  ? ? ?  ? Influenza A by PCR NEGATIVE NEGATIVE Final  ? Influenza B by PCR NEGATIVE NEGATIVE Final  ?  Comment: (NOTE) ?The Xpert Xpress SARS-CoV-2/FLU/RSV plus assay is intended as an aid ?in the diagnosis of influenza from Nasopharyngeal swab specimens and ?should not be used as a sole basis for treatment. Nasal washings and ?aspirates are unacceptable for Xpert Xpress SARS-CoV-2/FLU/RSV ?testing. ? ?Fact Sheet for Patients: ?BloggerCourse.com ? ?Fact Sheet for Healthcare Providers: ?SeriousBroker.it ? ?This test is not yet approved or cleared by the [place] FDA and ?has been authorized for detection and/or diagnosis of SARS-CoV-2 by ?FDA under an Emergency Use Authorization (EUA). This EUA will remain ?in effect (meaning this test can be used) for the duration of the ?COVID-19 declaration under Section 564(b)(1) of the Act, 21 U.S.C. ?section 360bbb-3(b)(1), unless the authorization is terminated or ?revoked. ? ?Performed at [facility], [address] ?[place], [place] [address] ?   ?Respiratory (~20 pathogens) panel by PCR     Status: None  ? Collection Time: 08/03/21  2:52 PM  ? Specimen: Nasopharyngeal Swab; Respiratory  ?Result Value Ref Range Status  ? Adenovirus NOT DETECTED NOT DETECTED Final  ? Coronavirus 229E NOT DETECTED NOT DETECTED Final  ?  Comment: (NOTE) ?The Coronavirus on the Respiratory Panel, DOES NOT test for the novel  ?Coronavirus (2019 nCoV) ?  ? Coronavirus HKU1 NOT DETECTED NOT DETECTED Final  ? Coronavirus NL63 NOT DETECTED NOT DETECTED Final  ? Coronavirus OC43 NOT DETECTED NOT DETECTED Final  ? Metapneumovirus NOT DETECTED NOT DETECTED Final  ? Rhinovirus / Enterovirus NOT DETECTED NOT DETECTED Final  ? Influenza A NOT DETECTED NOT DETECTED Final  ? Influenza B NOT DETECTED NOT DETECTED Final  ? Parainfluenza Virus 1 NOT DETECTED NOT DETECTED Final  ? Parainfluenza Virus 2 NOT DETECTED NOT DETECTED Final  ? Parainfluenza Virus 3 NOT DETECTED NOT DETECTED Final  ? Parainfluenza Virus 4 NOT DETECTED NOT DETECTED Final  ? Respiratory Syncytial Virus NOT DETECTED NOT DETECTED Final  ? Bordetella pertussis NOT DETECTED NOT DETECTED Final  ? Bordetella Parapertussis NOT DETECTED NOT DETECTED Final  ? Chlamydophila pneumoniae NOT DETECTED NOT DETECTED Final  ? Mycoplasma pneumoniae  NOT DETECTED NOT DETECTED Final  ?  Comment: Performed at [facility], [address] [place], [place] [address]  ?Resp Panel by RT-PCR (Flu A&B, Covid) Nasopharyngeal Swab     Status: None  ? Collection Time: 08/11/21  6:20 AM  ? Specimen: Nasopharyngeal Swab; Nasopharyngeal(NP) swabs in vial transport medium  ?Result Value Ref Range Status  ? SARS Coronavirus 2 by RT PCR NEGATIVE NEGATIVE Final  ?  Comment: (NOTE) ?SARS-CoV-2 target nucleic acids are NOT DETECTED. ? ?The SARS-CoV-2 RNA is generally detectable in upper respiratory ?specimens during the acute phase of infection. The lowest ?concentration of SARS-CoV-2 viral copies this assay can detect is ?138 copies/mL. A negative  result does not preclude SARS-Cov-2 ?infection and should not be used as the sole basis for treatment or ?other patient management decisions. A negative result may occur with  ?improper specimen collection/han

## 2021-08-12 NOTE — Progress Notes (Addendum)
Subjective: ?Martin [name] is a 31 year old male with a medical history significant for sickle cell disease, chronic pain syndrome, history of HIV, history of pulmonary embolism on Eliquis, and anemia of chronic disease was admitted for sickle cell crisis. ? ?Today, patient has an oxygen requirement of 4 L/min which is increased from home oxygen.  Patient denies shortness of breath.  He endorses fatigue.  No fever, chills, urinary symptoms, nausea, vomiting, or diarrhea. ?Objective: ? ?Vital signs in last 24 hours: ? ?Vitals:  ? 08/12/21 0544 08/12/21 0744 08/12/21 1003 08/12/21 1052  ?BP: 101/62  104/64   ?Pulse: 81  73   ?Resp: 14 16 16 16  ?Temp: (!) 97.5 ?F (36.4 ?C)  97.9 ?F (36.6 ?C)   ?TempSrc: Oral  Oral   ?SpO2: 94% 96% 96% 96%  ?Weight:      ?Height:      ? ? ?Intake/Output from previous day: ? ? ?Intake/Output Summary (Last 24 hours) at 08/12/2021 1422 ?Last data filed at 08/12/2021 0900 ?Gross per 24 hour  ?Intake 1524.15 ml  ?Output 2100 ml  ?Net -575.85 ml  ? ? ?Physical Exam: ?General: Alert, awake, oriented x3, in no acute distress.  ?HEENT: /AT PEERL, EOMI ?Neck: Trachea midline,  no masses, no thyromegal,y no JVD, no carotid bruit ?OROPHARYNX:  Moist, No exudate/ erythema/lesions.  ?Heart: Regular rate and rhythm, without murmurs, rubs, gallops, PMI non-displaced, no heaves or thrills on palpation.  ?Lungs: Clear to auscultation, no wheezing or rhonchi noted. No increased vocal fremitus resonant to percussion  ?Abdomen: Soft, nontender, nondistended, positive bowel sounds, no masses no hepatosplenomegaly noted..  ?Neuro: No focal neurological deficits noted cranial nerves II through XII grossly intact. DTRs 2+ bilaterally upper and lower extremities. Strength 5 out of 5 in bilateral upper and lower extremities. ?Musculoskeletal: No warm swelling or erythema around joints, no spinal tenderness noted. ?Psychiatric: Patient alert and oriented x3, good insight and cognition, good recent to remote  recall. ?Lymph node survey: No cervical axillary or inguinal lymphadenopathy noted. ? ?Lab Results: ? ?Basic Metabolic Panel: ?   ?Component Value Date/Time  ? NA 138 08/12/2021 0519  ? NA 136 03/18/2021 1232  ? K 4.4 08/12/2021 0519  ? CL 107 08/12/2021 0519  ? CO2 25 08/12/2021 0519  ? BUN 13 08/12/2021 0519  ? BUN 11 03/18/2021 1232  ? CREATININE 0.69 08/12/2021 0519  ? GLUCOSE 93 08/12/2021 0519  ? CALCIUM 8.5 (L) 08/12/2021 0519  ? ?CBC: ?   ?Component Value Date/Time  ? WBC 10.5 08/12/2021 0519  ? HGB 8.2 (L) 08/12/2021 0519  ? HGB 8.3 (L) 03/18/2021 1232  ? HCT 21.8 (L) 08/12/2021 0519  ? HCT 22.8 (L) 03/18/2021 1232  ? PLT 223 08/12/2021 0519  ? PLT 361 03/18/2021 1232  ? MCV 101.4 (H) 08/12/2021 0519  ? MCV 111 (H) 03/18/2021 1232  ? NEUTROABS 7.9 (H) 08/11/2021 0445  ? NEUTROABS 6.3 03/18/2021 1232  ? LYMPHSABS 5.2 (H) 08/11/2021 0445  ? LYMPHSABS 6.1 (H) 03/18/2021 1232  ? MONOABS 1.7 (H) 08/11/2021 0445  ? EOSABS 0.2 08/11/2021 0445  ? EOSABS 0.2 03/18/2021 1232  ? BASOSABS 0.0 08/11/2021 0445  ? BASOSABS 0.1 03/18/2021 1232  ? ? ?Recent Results (from the past 240 hour(s))  ?Resp Panel by RT-PCR (Flu A&B, Covid) Nasopharyngeal Swab     Status: None  ? Collection Time: 08/03/21  7:04 AM  ? Specimen: Nasopharyngeal Swab; Nasopharyngeal(NP) swabs in vial transport medium  ?Result Value Ref Range Status  ?   SARS Coronavirus 2 by RT PCR NEGATIVE NEGATIVE Final  ?  Comment: (NOTE) ?SARS-CoV-2 target nucleic acids are NOT DETECTED. ? ?The SARS-CoV-2 RNA is generally detectable in upper respiratory ?specimens during the acute phase of infection. The lowest ?concentration of SARS-CoV-2 viral copies this assay can detect is ?138 copies/mL. A negative result does not preclude SARS-Cov-2 ?infection and should not be used as the sole basis for treatment or ?other patient management decisions. A negative result may occur with  ?improper specimen collection/handling, submission of specimen other ?than nasopharyngeal  swab, presence of viral mutation(s) within the ?areas targeted by this assay, and inadequate number of viral ?copies(<138 copies/mL). A negative result must be combined with ?clinical observations, patient history, and epidemiological ?information. The expected result is Negative. ? ?Fact Sheet for Patients:  ?BloggerCourse.com ? ?Fact Sheet for Healthcare Providers:  ?SeriousBroker.it ? ?This test is no t yet approved or cleared by the [place] FDA and  ?has been authorized for detection and/or diagnosis of SARS-CoV-2 by ?FDA under an Emergency Use Authorization (EUA). This EUA will remain  ?in effect (meaning this test can be used) for the duration of the ?COVID-19 declaration under Section 564(b)(1) of the Act, 21 ?U.S.C.section 360bbb-3(b)(1), unless the authorization is terminated  ?or revoked sooner.  ? ? ?  ? Influenza A by PCR NEGATIVE NEGATIVE Final  ? Influenza B by PCR NEGATIVE NEGATIVE Final  ?  Comment: (NOTE) ?The Xpert Xpress SARS-CoV-2/FLU/RSV plus assay is intended as an aid ?in the diagnosis of influenza from Nasopharyngeal swab specimens and ?should not be used as a sole basis for treatment. Nasal washings and ?aspirates are unacceptable for Xpert Xpress SARS-CoV-2/FLU/RSV ?testing. ? ?Fact Sheet for Patients: ?BloggerCourse.com ? ?Fact Sheet for Healthcare Providers: ?SeriousBroker.it ? ?This test is not yet approved or cleared by the [place] FDA and ?has been authorized for detection and/or diagnosis of SARS-CoV-2 by ?FDA under an Emergency Use Authorization (EUA). This EUA will remain ?in effect (meaning this test can be used) for the duration of the ?COVID-19 declaration under Section 564(b)(1) of the Act, 21 U.S.C. ?section 360bbb-3(b)(1), unless the authorization is terminated or ?revoked. ? ?Performed at [facility], [address] ?[place], [place] [address] ?   ?Respiratory (~20 pathogens) panel by PCR     Status: None  ? Collection Time: 08/03/21  2:52 PM  ? Specimen: Nasopharyngeal Swab; Respiratory  ?Result Value Ref Range Status  ? Adenovirus NOT DETECTED NOT DETECTED Final  ? Coronavirus 229E NOT DETECTED NOT DETECTED Final  ?  Comment: (NOTE) ?The Coronavirus on the Respiratory Panel, DOES NOT test for the novel  ?Coronavirus (2019 nCoV) ?  ? Coronavirus HKU1 NOT DETECTED NOT DETECTED Final  ? Coronavirus NL63 NOT DETECTED NOT DETECTED Final  ? Coronavirus OC43 NOT DETECTED NOT DETECTED Final  ? Metapneumovirus NOT DETECTED NOT DETECTED Final  ? Rhinovirus / Enterovirus NOT DETECTED NOT DETECTED Final  ? Influenza A NOT DETECTED NOT DETECTED Final  ? Influenza B NOT DETECTED NOT DETECTED Final  ? Parainfluenza Virus 1 NOT DETECTED NOT DETECTED Final  ? Parainfluenza Virus 2 NOT DETECTED NOT DETECTED Final  ? Parainfluenza Virus 3 NOT DETECTED NOT DETECTED Final  ? Parainfluenza Virus 4 NOT DETECTED NOT DETECTED Final  ? Respiratory Syncytial Virus NOT DETECTED NOT DETECTED Final  ? Bordetella pertussis NOT DETECTED NOT DETECTED Final  ? Bordetella Parapertussis NOT DETECTED NOT DETECTED Final  ? Chlamydophila pneumoniae NOT DETECTED NOT DETECTED Final  ? Mycoplasma pneumoniae  NOT DETECTED NOT DETECTED Final  ?  Comment: Performed at [facility], [address] [place], [place] [address]  ?Resp Panel by RT-PCR (Flu A&B, Covid) Nasopharyngeal Swab     Status: None  ? Collection Time: 08/11/21  6:20 AM  ? Specimen: Nasopharyngeal Swab; Nasopharyngeal(NP) swabs in vial transport medium  ?Result Value Ref Range Status  ? SARS Coronavirus 2 by RT PCR NEGATIVE NEGATIVE Final  ?  Comment: (NOTE) ?SARS-CoV-2 target nucleic acids are NOT DETECTED. ? ?The SARS-CoV-2 RNA is generally detectable in upper respiratory ?specimens during the acute phase of infection. The lowest ?concentration of SARS-CoV-2 viral copies this assay can detect is ?138 copies/mL. A negative  result does not preclude SARS-Cov-2 ?infection and should not be used as the sole basis for treatment or ?other patient management decisions. A negative result may occur with  ?improper specimen collection/han

## 2021-08-13 DIAGNOSIS — D5700 Hb-SS disease with crisis, unspecified: Secondary | ICD-10-CM | POA: Diagnosis not present

## 2021-08-13 MED ORDER — OXYCODONE HCL 5 MG PO TABS
5.0000 mg | ORAL_TABLET | ORAL | Status: DC | PRN
  Administered 2021-08-13 – 2021-08-16 (×10): 5 mg via ORAL
  Filled 2021-08-13 (×10): qty 1

## 2021-08-13 MED ORDER — OXYCODONE-ACETAMINOPHEN 5-325 MG PO TABS
1.0000 | ORAL_TABLET | ORAL | Status: DC | PRN
  Administered 2021-08-13 – 2021-08-16 (×10): 1 via ORAL
  Filled 2021-08-13 (×10): qty 1

## 2021-08-13 NOTE — Progress Notes (Signed)
Subjective: ?Martin [name] is a 31 year old male with a medical history significant for sickle cell disease, chronic pain syndrome, history of HIV, history of pulmonary embolism on Eliquis, and anemia of chronic disease was admitted for sickle cell crisis. ?Patient is complaining of allover body pain.  He states, "I really do not feel good today".  He has been unable to get out of bed.  Patient denies shortness of breath.  He endorses fatigue.  No fever, chills, urinary symptoms, nausea, vomiting, or diarrhea. ?Objective: ? ?Vital signs in last 24 hours: ? ?Vitals:  ? 08/15/21 1535 08/15/21 1544 08/15/21 1548 08/15/21 1559  ?BP: 126/79   122/76  ?Pulse: 91   (!) 101  ?Resp: 15  16 16   ?Temp: (!) 97.5 ?F (36.4 ?C)   98 ?F (36.7 ?C)  ?TempSrc: Oral Oral    ?SpO2: 90%  90%   ?Weight:      ?Height:      ? ? ?Intake/Output from previous day: ? ? ?Intake/Output Summary (Last 24 hours) at 08/15/2021 1706 ?Last data filed at 08/15/2021 1600 ?Gross per 24 hour  ?Intake 375.68 ml  ?Output 500 ml  ?Net -124.32 ml  ? ? ?Physical Exam: ?General: Alert, awake, oriented x3, in no acute distress.  ?HEENT: [place]/AT PEERL, EOMI ?Neck: Trachea midline,  no masses, no thyromegal,y no JVD, no carotid bruit ?OROPHARYNX:  Moist, No exudate/ erythema/lesions.  ?Heart: Regular rate and rhythm, without murmurs, rubs, gallops, PMI non-displaced, no heaves or thrills on palpation.  ?Lungs: Clear to auscultation, no wheezing or rhonchi noted. No increased vocal fremitus resonant to percussion  ?Abdomen: Soft, nontender, nondistended, positive bowel sounds, no masses no hepatosplenomegaly noted.08/17/2021  ?Neuro: No focal neurological deficits noted cranial nerves II through XII grossly intact. DTRs 2+ bilaterally upper and lower extremities. Strength 5 out of 5 in bilateral upper and lower extremities. ?Musculoskeletal: No warm swelling or erythema around joints, no spinal tenderness noted. ?Psychiatric: Patient alert and oriented x3, good insight and  cognition, good recent to remote recall. ?Lymph node survey: No cervical axillary or inguinal lymphadenopathy noted. ? ?Lab Results: ? ?Basic Metabolic Panel: ?   ?Component Value Date/Time  ? NA 138 08/12/2021 0519  ? NA 136 03/18/2021 1232  ? K 4.4 08/12/2021 0519  ? CL 107 08/12/2021 0519  ? CO2 25 08/12/2021 0519  ? BUN 13 08/12/2021 0519  ? BUN 11 03/18/2021 1232  ? CREATININE 0.69 08/12/2021 0519  ? GLUCOSE 93 08/12/2021 0519  ? CALCIUM 8.5 (L) 08/12/2021 0519  ? ?CBC: ?   ?Component Value Date/Time  ? WBC 13.8 (H) 08/15/2021 0534  ? HGB 7.5 (L) 08/15/2021 0534  ? HGB 8.3 (L) 03/18/2021 1232  ? HCT 19.3 (L) 08/15/2021 0534  ? HCT 22.8 (L) 03/18/2021 1232  ? PLT 336 08/15/2021 0534  ? PLT 361 03/18/2021 1232  ? MCV 97.5 08/15/2021 0534  ? MCV 111 (H) 03/18/2021 1232  ? NEUTROABS 3.8 08/15/2021 0534  ? NEUTROABS 6.3 03/18/2021 1232  ? LYMPHSABS 8.4 (H) 08/15/2021 0534  ? LYMPHSABS 6.1 (H) 03/18/2021 1232  ? MONOABS 0.9 08/15/2021 0534  ? EOSABS 0.6 (H) 08/15/2021 0534  ? EOSABS 0.2 03/18/2021 1232  ? BASOSABS 0.0 08/15/2021 0534  ? BASOSABS 0.1 03/18/2021 1232  ? ? ?Recent Results (from the past 240 hour(s))  ?Resp Panel by RT-PCR (Flu A&B, Covid) Nasopharyngeal Swab     Status: None  ? Collection Time: 08/11/21  6:20 AM  ? Specimen: Nasopharyngeal Swab; Nasopharyngeal(NP) swabs  in vial transport medium  ?Result Value Ref Range Status  ? SARS Coronavirus 2 by RT PCR NEGATIVE NEGATIVE Final  ?  Comment: (NOTE) ?SARS-CoV-2 target nucleic acids are NOT DETECTED. ? ?The SARS-CoV-2 RNA is generally detectable in upper respiratory ?specimens during the acute phase of infection. The lowest ?concentration of SARS-CoV-2 viral copies this assay can detect is ?138 copies/mL. A negative result does not preclude SARS-Cov-2 ?infection and should not be used as the sole basis for treatment or ?other patient management decisions. A negative result may occur with  ?improper specimen collection/handling, submission of specimen  other ?than nasopharyngeal swab, presence of viral mutation(s) within the ?areas targeted by this assay, and inadequate number of viral ?copies(<138 copies/mL). A negative result must be combined with ?clinical observations, patient history, and epidemiological ?information. The expected result is Negative. ? ?Fact Sheet for Patients:  ?BloggerCourse.com ? ?Fact Sheet for Healthcare Providers:  ?SeriousBroker.it ? ?This test is no t yet approved or cleared by the [place] FDA and  ?has been authorized for detection and/or diagnosis of SARS-CoV-2 by ?FDA under an Emergency Use Authorization (EUA). This EUA will remain  ?in effect (meaning this test can be used) for the duration of the ?COVID-19 declaration under Section 564(b)(1) of the Act, 21 ?U.S.C.section 360bbb-3(b)(1), unless the authorization is terminated  ?or revoked sooner.  ? ? ?  ? Influenza A by PCR NEGATIVE NEGATIVE Final  ? Influenza B by PCR NEGATIVE NEGATIVE Final  ?  Comment: (NOTE) ?The Xpert Xpress SARS-CoV-2/FLU/RSV plus assay is intended as an aid ?in the diagnosis of influenza from Nasopharyngeal swab specimens and ?should not be used as a sole basis for treatment. Nasal washings and ?aspirates are unacceptable for Xpert Xpress SARS-CoV-2/FLU/RSV ?testing. ? ?Fact Sheet for Patients: ?BloggerCourse.com ? ?Fact Sheet for Healthcare Providers: ?SeriousBroker.it ? ?This test is not yet approved or cleared by the [place] FDA and ?has been authorized for detection and/or diagnosis of SARS-CoV-2 by ?FDA under an Emergency Use Authorization (EUA). This EUA will remain ?in effect (meaning this test can be used) for the duration of the ?COVID-19 declaration under Section 564(b)(1) of the Act, 21 U.S.C. ?section 360bbb-3(b)(1), unless the authorization is terminated or ?revoked. ? ?Performed at [facility], [address] ?[place], [place] [address] ?  ? ? ?Studies/Results: ?No results found. ? ?Medications: ?Scheduled Meds: ? abacavir-dolutegravir-lamiVUDine  1 tablet Oral QHS  ? ALPRAZolam  1 mg Oral QHS  ? apixaban  5 mg Oral BID  ? Chlorhexidine Gluconate Cloth  6 each Topical Daily  ? HYDROmorphone   Intravenous Q4H  ? hydroxyurea  2,000 mg Oral QHS  ? senna-docusate  1 tablet Oral BID  ? ?Continuous Infusions: ? sodium chloride Stopped (08/12/21 2345)  ? cefTRIAXone (ROCEPHIN)  IV 1 g (08/14/21 2205)  ? ?PRN Meds:.diphenhydrAMINE, hydrocortisone cream, lip balm, melatonin, naloxone **AND** sodium chloride flush, ondansetron, oxyCODONE-acetaminophen **AND** oxyCODONE, polyethylene glycol ? ?Consultants: ?none ? ?Procedures: ?none ? ?Antibiotics: ?none ? ?Assessment/Plan: ?Principal Problem: ?  Acute sickle cell crisis (HCC) ?Active Problems: ?  Sickle cell pain crisis (HCC) ?  History of pulmonary embolus (PE) ?  Leukocytosis ?  Acute on chronic respiratory failure (HCC) ? ?Respiratory failure with hypoxia: ?Repeat chest x-ray.  Review results as they become available. ?Supplemental oxygen at 4 L, titrate as tolerated ? ?Sickle cell disease with pain crisis: ?Continue IV Dilaudid PCA without any changes ?Percocet 10-325 mg every 4 hours as needed for breakthrough pain ?Monitor vital  signs very closely, reevaluate pain scale regularly, and supplemental oxygen at 4 L. ? ?Chronic pain syndrome: ?Restart home medication as pain intensity improves ? ?HIV disease: ?Continue antivirals.  Patient [place] infectious disease and [place]. ? ?History of pulmonary embolism: ?Continue apixaban. ? ?Anemia of chronic disease: ?Patient's hemoglobin is stable and consistent with his baseline.  There is no clinical indication for blood transfusion at this time.  Continue to follow closely.  Labs in AM. ? ?Leukocytosis: ?Stable.  Patient afebrile.  Chest x-ray pending.  Urinalysis pending.  Follow closely.  Labs in AM. ? ?Viral vs  atypical pneumonia: ?Initiate antibiotics.  Ceftriaxone and azithromycin.  Continue supplemental oxygen. ?Tylenol 650 mg every 4 hours as needed for fever ? ? ? ?Code Status: Full Code ?Family Communication

## 2021-08-14 DIAGNOSIS — D5700 Hb-SS disease with crisis, unspecified: Secondary | ICD-10-CM | POA: Diagnosis not present

## 2021-08-14 LAB — CBC WITH DIFFERENTIAL/PLATELET
Abs Immature Granulocytes: 0.03 10*3/uL (ref 0.00–0.07)
Basophils Absolute: 0 10*3/uL (ref 0.0–0.1)
Basophils Relative: 0 %
Eosinophils Absolute: 0.7 10*3/uL — ABNORMAL HIGH (ref 0.0–0.5)
Eosinophils Relative: 6 %
HCT: 19.6 % — ABNORMAL LOW (ref 39.0–52.0)
Hemoglobin: 7.8 g/dL — ABNORMAL LOW (ref 13.0–17.0)
Immature Granulocytes: 0 %
Lymphocytes Relative: 55 %
Lymphs Abs: 6.9 10*3/uL — ABNORMAL HIGH (ref 0.7–4.0)
MCH: 38.8 pg — ABNORMAL HIGH (ref 26.0–34.0)
MCHC: 39.8 g/dL — ABNORMAL HIGH (ref 30.0–36.0)
MCV: 97.5 fL (ref 80.0–100.0)
Monocytes Absolute: 0.9 10*3/uL (ref 0.1–1.0)
Monocytes Relative: 7 %
Neutro Abs: 4.1 10*3/uL (ref 1.7–7.7)
Neutrophils Relative %: 32 %
Platelets: 286 10*3/uL (ref 150–400)
RBC: 2.01 MIL/uL — ABNORMAL LOW (ref 4.22–5.81)
RDW: 20.6 % — ABNORMAL HIGH (ref 11.5–15.5)
WBC: 12.6 10*3/uL — ABNORMAL HIGH (ref 4.0–10.5)
nRBC: 3.2 % — ABNORMAL HIGH (ref 0.0–0.2)

## 2021-08-14 MED ORDER — ALPRAZOLAM 1 MG PO TABS
1.0000 mg | ORAL_TABLET | Freq: Every day | ORAL | Status: DC
  Administered 2021-08-14 – 2021-08-16 (×3): 1 mg via ORAL
  Filled 2021-08-14 (×3): qty 1

## 2021-08-14 NOTE — Progress Notes (Addendum)
Subjective: ?Martin [name] is a 31 year old male with a medical history significant for sickle cell disease, chronic pain syndrome, history of HIV, history of pulmonary embolism on Eliquis, and anemia of chronic disease was admitted for sickle cell crisis. ?Patient continues to have all over body pain.  Pain intensity is 8/10. He has been unable to get out of bed.  Patient denies shortness of breath.  He endorses fatigue.  No fever, chills, urinary symptoms, nausea, vomiting, or diarrhea. ?Objective: ? ?Vital signs in last 24 hours: ? ?Vitals:  ? 08/15/21 1535 08/15/21 1544 08/15/21 1548 08/15/21 1559  ?BP: 126/79   122/76  ?Pulse: 91   (!) 101  ?Resp: 15  16 16   ?Temp: (!) 97.5 ?F (36.4 ?C)   98 ?F (36.7 ?C)  ?TempSrc: Oral Oral    ?SpO2: 90%  90%   ?Weight:      ?Height:      ? ? ?Intake/Output from previous day: ? ? ?Intake/Output Summary (Last 24 hours) at 08/15/2021 1713 ?Last data filed at 08/15/2021 1600 ?Gross per 24 hour  ?Intake 375.68 ml  ?Output 500 ml  ?Net -124.32 ml  ? ? ?Physical Exam: ?General: Alert, awake, oriented x3, in no acute distress.  ?HEENT: [place]/AT PEERL, EOMI ?Neck: Trachea midline,  no masses, no thyromegal,y no JVD, no carotid bruit ?OROPHARYNX:  Moist, No exudate/ erythema/lesions.  ?Heart: Regular rate and rhythm, without murmurs, rubs, gallops, PMI non-displaced, no heaves or thrills on palpation.  ?Lungs: Clear to auscultation, no wheezing or rhonchi noted. No increased vocal fremitus resonant to percussion  ?Abdomen: Soft, nontender, nondistended, positive bowel sounds, no masses no hepatosplenomegaly noted.Marland Kitchen.  ?Neuro: No focal neurological deficits noted cranial nerves II through XII grossly intact. DTRs 2+ bilaterally upper and lower extremities. Strength 5 out of 5 in bilateral upper and lower extremities. ?Musculoskeletal: No warm swelling or erythema around joints, no spinal tenderness noted. ?Psychiatric: Patient alert and oriented x3, good insight and cognition, good recent  to remote recall. ?Lymph node survey: No cervical axillary or inguinal lymphadenopathy noted. ? ?Lab Results: ? ?Basic Metabolic Panel: ?   ?Component Value Date/Time  ? NA 138 08/12/2021 0519  ? NA 136 03/18/2021 1232  ? K 4.4 08/12/2021 0519  ? CL 107 08/12/2021 0519  ? CO2 25 08/12/2021 0519  ? BUN 13 08/12/2021 0519  ? BUN 11 03/18/2021 1232  ? CREATININE 0.69 08/12/2021 0519  ? GLUCOSE 93 08/12/2021 0519  ? CALCIUM 8.5 (L) 08/12/2021 0519  ? ?CBC: ?   ?Component Value Date/Time  ? WBC 13.8 (H) 08/15/2021 0534  ? HGB 7.5 (L) 08/15/2021 0534  ? HGB 8.3 (L) 03/18/2021 1232  ? HCT 19.3 (L) 08/15/2021 0534  ? HCT 22.8 (L) 03/18/2021 1232  ? PLT 336 08/15/2021 0534  ? PLT 361 03/18/2021 1232  ? MCV 97.5 08/15/2021 0534  ? MCV 111 (H) 03/18/2021 1232  ? NEUTROABS 3.8 08/15/2021 0534  ? NEUTROABS 6.3 03/18/2021 1232  ? LYMPHSABS 8.4 (H) 08/15/2021 0534  ? LYMPHSABS 6.1 (H) 03/18/2021 1232  ? MONOABS 0.9 08/15/2021 0534  ? EOSABS 0.6 (H) 08/15/2021 0534  ? EOSABS 0.2 03/18/2021 1232  ? BASOSABS 0.0 08/15/2021 0534  ? BASOSABS 0.1 03/18/2021 1232  ? ? ?Recent Results (from the past 240 hour(s))  ?Resp Panel by RT-PCR (Flu A&B, Covid) Nasopharyngeal Swab     Status: None  ? Collection Time: 08/11/21  6:20 AM  ? Specimen: Nasopharyngeal Swab; Nasopharyngeal(NP) swabs in vial transport medium  ?  Result Value Ref Range Status  ? SARS Coronavirus 2 by RT PCR NEGATIVE NEGATIVE Final  ?  Comment: (NOTE) ?SARS-CoV-2 target nucleic acids are NOT DETECTED. ? ?The SARS-CoV-2 RNA is generally detectable in upper respiratory ?specimens during the acute phase of infection. The lowest ?concentration of SARS-CoV-2 viral copies this assay can detect is ?138 copies/mL. A negative result does not preclude SARS-Cov-2 ?infection and should not be used as the sole basis for treatment or ?other patient management decisions. A negative result may occur with  ?improper specimen collection/handling, submission of specimen other ?than  nasopharyngeal swab, presence of viral mutation(s) within the ?areas targeted by this assay, and inadequate number of viral ?copies(<138 copies/mL). A negative result must be combined with ?clinical observations, patient history, and epidemiological ?information. The expected result is Negative. ? ?Fact Sheet for Patients:  ?BloggerCourse.com ? ?Fact Sheet for Healthcare Providers:  ?SeriousBroker.it ? ?This test is no t yet approved or cleared by the [place] FDA and  ?has been authorized for detection and/or diagnosis of SARS-CoV-2 by ?FDA under an Emergency Use Authorization (EUA). This EUA will remain  ?in effect (meaning this test can be used) for the duration of the ?COVID-19 declaration under Section 564(b)(1) of the Act, 21 ?U.S.C.section 360bbb-3(b)(1), unless the authorization is terminated  ?or revoked sooner.  ? ? ?  ? Influenza A by PCR NEGATIVE NEGATIVE Final  ? Influenza B by PCR NEGATIVE NEGATIVE Final  ?  Comment: (NOTE) ?The Xpert Xpress SARS-CoV-2/FLU/RSV plus assay is intended as an aid ?in the diagnosis of influenza from Nasopharyngeal swab specimens and ?should not be used as a sole basis for treatment. Nasal washings and ?aspirates are unacceptable for Xpert Xpress SARS-CoV-2/FLU/RSV ?testing. ? ?Fact Sheet for Patients: ?BloggerCourse.com ? ?Fact Sheet for Healthcare Providers: ?SeriousBroker.it ? ?This test is not yet approved or cleared by the [place] FDA and ?has been authorized for detection and/or diagnosis of SARS-CoV-2 by ?FDA under an Emergency Use Authorization (EUA). This EUA will remain ?in effect (meaning this test can be used) for the duration of the ?COVID-19 declaration under Section 564(b)(1) of the Act, 21 U.S.C. ?section 360bbb-3(b)(1), unless the authorization is terminated or ?revoked. ? ?Performed at [facility], [address] ?[place], [place] [address] ?  ? ? ?Studies/Results: ?No results found. ? ?Medications: ?Scheduled Meds: ? abacavir-dolutegravir-lamiVUDine  1 tablet Oral QHS  ? ALPRAZolam  1 mg Oral QHS  ? apixaban  5 mg Oral BID  ? Chlorhexidine Gluconate Cloth  6 each Topical Daily  ? HYDROmorphone   Intravenous Q4H  ? hydroxyurea  2,000 mg Oral QHS  ? senna-docusate  1 tablet Oral BID  ? ?Continuous Infusions: ? sodium chloride Stopped (08/12/21 2345)  ? cefTRIAXone (ROCEPHIN)  IV 1 g (08/14/21 2205)  ? ?PRN Meds:.diphenhydrAMINE, hydrocortisone cream, lip balm, melatonin, naloxone **AND** sodium chloride flush, ondansetron, oxyCODONE-acetaminophen **AND** oxyCODONE, polyethylene glycol ? ?Consultants: ?none ? ?Procedures: ?none ? ?Antibiotics: ?none ? ?Assessment/Plan: ?Principal Problem: ?  Acute sickle cell crisis (HCC) ?Active Problems: ?  Sickle cell pain crisis (HCC) ?  History of pulmonary embolus (PE) ?  Leukocytosis ?  Acute on chronic respiratory failure (HCC) ? ?Respiratory failure with hypoxia: ?Liters supplemental oxygen.  Titrate as tolerated. ? ?Sickle cell disease with pain crisis: ?Continue IV Dilaudid PCA without any changes ?Percocet 10-325 mg every 4 hours as needed for breakthrough pain ?Monitor vital signs very closely, reevaluate pain scale regularly, and supplemental oxygen at 4 L. ? ?Chronic pain  syndrome: ?Continue home medications ? ?HIV disease: ?Continue antivirals.CD4 pending.  Patient is followed by infectious disease and [place], [place]. ? ?History of pulmonary embolism: ?Continue apixaban. ? ?Anemia of chronic disease: ?Patient's hemoglobin is stable and consistent with his baseline.  There is no clinical indication for blood transfusion at this time.  Continue to follow closely.  Labs in AM. ? ?Leukocytosis: ?Stable.  Patient afebrile.  Chest x-ray pending.  Urinalysis pending.  Follow closely.  Labs in AM. ? ?Viral vs atypical pneumonia: ?Patient remains afebrile Continue antibiotics.  Continue supplemental oxygen. ?Tylenol 650 mg every 4 hours as needed for fever ? ? ? ?Code Status: Full Code ?Family Communication: N/A ?Disposition Plan: Not yet ready for discharge ? ?[name]  APRN, [name], [name] ?Patient

## 2021-08-15 DIAGNOSIS — D5700 Hb-SS disease with crisis, unspecified: Secondary | ICD-10-CM | POA: Diagnosis not present

## 2021-08-15 LAB — CBC WITH DIFFERENTIAL/PLATELET
Abs Immature Granulocytes: 0.03 10*3/uL (ref 0.00–0.07)
Basophils Absolute: 0 10*3/uL (ref 0.0–0.1)
Basophils Relative: 0 %
Eosinophils Absolute: 0.6 10*3/uL — ABNORMAL HIGH (ref 0.0–0.5)
Eosinophils Relative: 4 %
HCT: 19.3 % — ABNORMAL LOW (ref 39.0–52.0)
Hemoglobin: 7.5 g/dL — ABNORMAL LOW (ref 13.0–17.0)
Immature Granulocytes: 0 %
Lymphocytes Relative: 61 %
Lymphs Abs: 8.4 10*3/uL — ABNORMAL HIGH (ref 0.7–4.0)
MCH: 37.9 pg — ABNORMAL HIGH (ref 26.0–34.0)
MCHC: 38.9 g/dL — ABNORMAL HIGH (ref 30.0–36.0)
MCV: 97.5 fL (ref 80.0–100.0)
Monocytes Absolute: 0.9 10*3/uL (ref 0.1–1.0)
Monocytes Relative: 7 %
Neutro Abs: 3.8 10*3/uL (ref 1.7–7.7)
Neutrophils Relative %: 28 %
Platelets: 336 10*3/uL (ref 150–400)
RBC: 1.98 MIL/uL — ABNORMAL LOW (ref 4.22–5.81)
RDW: 21.1 % — ABNORMAL HIGH (ref 11.5–15.5)
WBC: 13.8 10*3/uL — ABNORMAL HIGH (ref 4.0–10.5)
nRBC: 3.6 % — ABNORMAL HIGH (ref 0.0–0.2)

## 2021-08-15 LAB — PREPARE RBC (CROSSMATCH)

## 2021-08-15 LAB — HEMOGLOBIN AND HEMATOCRIT, BLOOD
HCT: 22.3 % — ABNORMAL LOW (ref 39.0–52.0)
Hemoglobin: 8.6 g/dL — ABNORMAL LOW (ref 13.0–17.0)

## 2021-08-15 LAB — T-HELPER CELLS (CD4) COUNT (NOT AT ARMC)

## 2021-08-15 MED ORDER — ACETAMINOPHEN 325 MG PO TABS
650.0000 mg | ORAL_TABLET | Freq: Once | ORAL | Status: AC
  Administered 2021-08-15: 650 mg via ORAL
  Filled 2021-08-15: qty 2

## 2021-08-15 MED ORDER — SODIUM CHLORIDE 0.9% IV SOLUTION: Freq: Once | INTRAVENOUS | Status: AC

## 2021-08-15 MED ORDER — DIPHENHYDRAMINE HCL 50 MG/ML IJ SOLN
25.0000 mg | Freq: Once | INTRAMUSCULAR | Status: AC
  Administered 2021-08-15: 25 mg via INTRAVENOUS
  Filled 2021-08-15: qty 1

## 2021-08-15 MED ORDER — CHLORHEXIDINE GLUCONATE CLOTH 2 % EX PADS
6.0000 | MEDICATED_PAD | Freq: Every day | CUTANEOUS | Status: DC
  Administered 2021-08-15 – 2021-08-16 (×2): 6 via TOPICAL

## 2021-08-15 NOTE — TOC Initial Note (Signed)
Transition of Care (TOC) - Initial/Assessment Note  ? ? ?Patient Details  ?Name: Martin [name] ?MRN: [id number] ?Date of Birth: 30-Mar-1991 ? ?Transition of Care (TOC) CM/SW Contact:    ?Daila Elbert, Meriam Sprague, RN ?Phone Number: ?08/15/2021, 1:50 PM ? ?Clinical Narrative:                 ? ? ?Expected Discharge Plan: Home/Self Care ?Barriers to Discharge: Continued Medical Work up ? ?  ? ?Expected Discharge Plan and Services ?Expected Discharge Plan: Home/Self Care ?  ?Discharge Planning Services: CM Consult ?  ?Living arrangements for the past 2 months: Single Family Home ?                ?  ?  ?Prior Living Arrangements/Services ?Living arrangements for the past 2 months: Single Family Home ?Lives with:: Self ?Patient language and need for interpreter reviewed:: Yes ?       ?Need for Family Participation in Patient Care: No (Comment) ?  ?  ?Criminal Activity/Legal Involvement Pertinent to Current Situation/Hospitalization: No - Comment as needed ? ?Activities of Daily Living ?Home Assistive Devices/Equipment: None ?ADL Screening (condition at time of admission) ?Patient's cognitive ability adequate to safely complete daily activities?: Yes ?Is the patient deaf or have difficulty hearing?: No ?Does the patient have difficulty seeing, even when wearing glasses/contacts?: No ?Does the patient have difficulty concentrating, remembering, or making decisions?: No ?Patient able to express need for assistance with ADLs?: Yes ?Does the patient have difficulty dressing or bathing?: No ?Independently performs ADLs?: Yes (appropriate for developmental age) ?Does the patient have difficulty walking or climbing stairs?: No ?Weakness of Legs: None ?Weakness of Arms/Hands: None ? ?  ?  ?Orientation: : Oriented to Situation, Oriented to  Time, Oriented to Place, Oriented to Self ?Alcohol / Substance Use: Not Applicable ?Psych Involvement: No (comment) ? ?Admission diagnosis:  Sickle cell pain crisis (HCC) [D57.00] ?Acute sickle cell  crisis (HCC) [D57.00] ?Patient Active Problem List  ? Diagnosis Date Noted  ? Acute sickle cell crisis (HCC) 08/11/2021  ? Tinea capitis 07/29/2021  ? Sickle cell crisis (HCC) 06/24/2021  ? Bacteremia due to Enterococcus 08/29/2020  ? Acute chest syndrome (HCC) 08/26/2020  ? Hypoxia 07/09/2020  ? COVID-19 05/13/2020  ? 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/18/2020  ? 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 Abnormal penile discharge, without blood 02/22/2020  ? Leukocytosis 01/02/2020  ? Anxiety 11/27/2019  ? Proteinuria 11/27/2019  ? Acute on chronic respiratory failure (HCC) 11/16/2019  ? Chronic, continuous use of opioids 08/15/2019  ? Seasonal allergies 08/15/2019  ? Chest congestion   ? Sickle cell anemia with crisis (HCC) 08/04/2019  ? History of pulmonary embolus (PE) 08/04/2019  ? Single subsegmental pulmonary embolism without acute cor pulmonale (HCC) 02/10/2019  ? Acute chest syndrome due to sickle cell crisis (HCC) 02/10/2019  ? 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) 03/09/2018  ? Bone pain 03/07/2018  ? Hip pain 03/07/2018  ? Acute bronchitis due to Streptococcus 02/21/2018  ? Heart murmur 02/03/2017  ? Sickle cell disease with crisis (HCC) 02/02/2017  ? 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 Vitamin D deficiency 08/14/2016  ? Sickle cell pain crisis (HCC) 02/12/2016  ? High risk medication use 09/27/2014  ? Generalized anxiety disorder 05/19/2014  ? Gastro-esophageal reflux disease without esophagitis 05/19/2014  ? Marijuana use 11/07/2012  ? Human immunodeficiency virus (HIV) disease (HCC) 05/23/2012  ? ?PCP:  Massie Maroon, [name] ?Pharmacy:   ?[facility] ?[address] ?[place] [address] ?Phone: [phone] Fax: [phone] ? ? ? ? ?Social Determinants of Health (SDOH) Interventions ?  ? ?Readmission Risk Interventions ? ?  08/15/2021  ?  1:48 PM 05/28/2021  ?  2:18 PM 04/04/2020  ? 11:22 AM  ?Readmission Risk Prevention Plan  ?Transportation Screening Complete  Complete Complete  ?PCP or Specialist Appt within 3-5 Days  Complete   ?HRI or Home Care Consult  Complete   ?Social Work Consult for Recovery Care Planning/Counseling  Complete   ?Palliative Care Screening  Not Applicable   ?Medication Review Oceanographer) Complete Complete Complete  ?PCP or Specialist appointment within 3-5 days of discharge Complete  Not Complete  ?PCP/Specialist Appt Not Complete comments   Not ready for dc  ?HRI or Home Care Consult Complete  Not Complete  ?HRI or Home Care Consult Pt Refusal Comments   Not needed  ?SW Recovery Care/Counseling Consult Complete  Complete  ?Palliative Care Screening Not Applicable  Not Applicable  ?Skilled Nursing Facility Not Applicable  Not Applicable  ? ? ? ?

## 2021-08-15 NOTE — Progress Notes (Signed)
Walked in pt's room and continuous IV fluids were running at 888cc/hr. Without mentioning, pt changed IV pump to 10cc/hr. IV pump has now been locked with correct settings.  ?

## 2021-08-15 NOTE — Progress Notes (Signed)
Subjective: ?Martin [name] is a 31 year old male with a medical history significant for sickle cell disease, chronic pain syndrome, history of HIV, history of pulmonary embolism on Eliquis, and anemia of chronic disease was admitted for sickle cell crisis. ? ?, Patient's hemoglobin is 7.5 g/dL, which is below his baseline.  He continues to endorse fatigue and shortness of breath.  Patient is on 4 L supplemental oxygen, he is typically on 3 L.  Patient continues to have all over body pain.  Pain intensity is 8/10. He has been unable to get out of bed.  Patient denies shortness of breath.  He endorses fatigue.  No fever, chills, urinary symptoms, nausea, vomiting, or diarrhea. ?Objective: ? ?Vital signs in last 24 hours: ? ?Vitals:  ? 08/15/21 1535 08/15/21 1544 08/15/21 1548 08/15/21 1559  ?BP: 126/79   122/76  ?Pulse: 91   (!) 101  ?Resp: 15  16 16   ?Temp: (!) 97.5 ?F (36.4 ?C)   98 ?F (36.7 ?C)  ?TempSrc: Oral Oral    ?SpO2: 90%  90%   ?Weight:      ?Height:      ? ? ?Intake/Output from previous day: ? ? ?Intake/Output Summary (Last 24 hours) at 08/15/2021 1718 ?Last data filed at 08/15/2021 1600 ?Gross per 24 hour  ?Intake 375.68 ml  ?Output 500 ml  ?Net -124.32 ml  ? ? ?Physical Exam: ?General: Alert, awake, oriented x3, in no acute distress.  ?HEENT: [place]/AT PEERL, EOMI ?Neck: Trachea midline,  no masses, no thyromegal,y no JVD, no carotid bruit ?OROPHARYNX:  Moist, No exudate/ erythema/lesions.  ?Heart: Regular rate and rhythm, without murmurs, rubs, gallops, PMI non-displaced, no heaves or thrills on palpation.  ?Lungs: Clear to auscultation, no wheezing or rhonchi noted. No increased vocal fremitus resonant to percussion  ?Abdomen: Soft, nontender, nondistended, positive bowel sounds, no masses no hepatosplenomegaly noted.08/17/2021  ?Neuro: No focal neurological deficits noted cranial nerves II through XII grossly intact. DTRs 2+ bilaterally upper and lower extremities. Strength 5 out of 5 in bilateral upper and  lower extremities. ?Musculoskeletal: No warm swelling or erythema around joints, no spinal tenderness noted. ?Psychiatric: Patient alert and oriented x3, good insight and cognition, good recent to remote recall. ?Lymph node survey: No cervical axillary or inguinal lymphadenopathy noted. ? ?Lab Results: ? ?Basic Metabolic Panel: ?   ?Component Value Date/Time  ? NA 138 08/12/2021 0519  ? NA 136 03/18/2021 1232  ? K 4.4 08/12/2021 0519  ? CL 107 08/12/2021 0519  ? CO2 25 08/12/2021 0519  ? BUN 13 08/12/2021 0519  ? BUN 11 03/18/2021 1232  ? CREATININE 0.69 08/12/2021 0519  ? GLUCOSE 93 08/12/2021 0519  ? CALCIUM 8.5 (L) 08/12/2021 0519  ? ?CBC: ?   ?Component Value Date/Time  ? WBC 13.8 (H) 08/15/2021 0534  ? HGB 7.5 (L) 08/15/2021 0534  ? HGB 8.3 (L) 03/18/2021 1232  ? HCT 19.3 (L) 08/15/2021 0534  ? HCT 22.8 (L) 03/18/2021 1232  ? PLT 336 08/15/2021 0534  ? PLT 361 03/18/2021 1232  ? MCV 97.5 08/15/2021 0534  ? MCV 111 (H) 03/18/2021 1232  ? NEUTROABS 3.8 08/15/2021 0534  ? NEUTROABS 6.3 03/18/2021 1232  ? LYMPHSABS 8.4 (H) 08/15/2021 0534  ? LYMPHSABS 6.1 (H) 03/18/2021 1232  ? MONOABS 0.9 08/15/2021 0534  ? EOSABS 0.6 (H) 08/15/2021 0534  ? EOSABS 0.2 03/18/2021 1232  ? BASOSABS 0.0 08/15/2021 0534  ? BASOSABS 0.1 03/18/2021 1232  ? ? ?Recent Results (from the past 240  hour(s))  ?Resp Panel by RT-PCR (Flu A&B, Covid) Nasopharyngeal Swab     Status: None  ? Collection Time: 08/11/21  6:20 AM  ? Specimen: Nasopharyngeal Swab; Nasopharyngeal(NP) swabs in vial transport medium  ?Result Value Ref Range Status  ? SARS Coronavirus 2 by RT PCR NEGATIVE NEGATIVE Final  ?  Comment: (NOTE) ?SARS-CoV-2 target nucleic acids are NOT DETECTED. ? ?The SARS-CoV-2 RNA is generally detectable in upper respiratory ?specimens during the acute phase of infection. The lowest ?concentration of SARS-CoV-2 viral copies this assay can detect is ?138 copies/mL. A negative result does not preclude SARS-Cov-2 ?infection and should not be used  as the sole basis for treatment or ?other patient management decisions. A negative result may occur with  ?improper specimen collection/handling, submission of specimen other ?than nasopharyngeal swab, presence of viral mutation(s) within the ?areas targeted by this assay, and inadequate number of viral ?copies(<138 copies/mL). A negative result must be combined with ?clinical observations, patient history, and epidemiological ?information. The expected result is Negative. ? ?Fact Sheet for Patients:  ?BloggerCourse.comhttps://www.fda.gov/media/152166/download ? ?Fact Sheet for Healthcare Providers:  ?SeriousBroker.ithttps://www.fda.gov/media/152162/download ? ?This test is no t yet approved or cleared by the [place] FDA and  ?has been authorized for detection and/or diagnosis of SARS-CoV-2 by ?FDA under an Emergency Use Authorization (EUA). This EUA will remain  ?in effect (meaning this test can be used) for the duration of the ?COVID-19 declaration under Section 564(b)(1) of the Act, 21 ?U.S.C.section 360bbb-3(b)(1), unless the authorization is terminated  ?or revoked sooner.  ? ? ?  ? Influenza A by PCR NEGATIVE NEGATIVE Final  ? Influenza B by PCR NEGATIVE NEGATIVE Final  ?  Comment: (NOTE) ?The Xpert Xpress SARS-CoV-2/FLU/RSV plus assay is intended as an aid ?in the diagnosis of influenza from Nasopharyngeal swab specimens and ?should not be used as a sole basis for treatment. Nasal washings and ?aspirates are unacceptable for Xpert Xpress SARS-CoV-2/FLU/RSV ?testing. ? ?Fact Sheet for Patients: ?BloggerCourse.comhttps://www.fda.gov/media/152166/download ? ?Fact Sheet for Healthcare Providers: ?SeriousBroker.ithttps://www.fda.gov/media/152162/download ? ?This test is not yet approved or cleared by the [place] FDA and ?has been authorized for detection and/or diagnosis of SARS-CoV-2 by ?FDA under an Emergency Use Authorization (EUA). This EUA will remain ?in effect (meaning this test can be used) for the duration of the ?COVID-19 declaration under Section 564(b)(1)  of the Act, 21 U.S.C. ?section 360bbb-3(b)(1), unless the authorization is terminated or ?revoked. ? ?Performed at [facility], [address] ?[place], [place] [id number] ?  ? ? ?Studies/Results: ?No results found. ? ?Medications: ?Scheduled Meds: ? abacavir-dolutegravir-lamiVUDine  1 tablet Oral QHS  ? ALPRAZolam  1 mg Oral QHS  ? apixaban  5 mg Oral BID  ? Chlorhexidine Gluconate Cloth  6 each Topical Daily  ? HYDROmorphone   Intravenous Q4H  ? hydroxyurea  2,000 mg Oral QHS  ? senna-docusate  1 tablet Oral BID  ? ?Continuous Infusions: ? sodium chloride Stopped (08/12/21 2345)  ? cefTRIAXone (ROCEPHIN)  IV 1 g (08/14/21 2205)  ? ?PRN Meds:.diphenhydrAMINE, hydrocortisone cream, lip balm, melatonin, naloxone **AND** sodium chloride flush, ondansetron, oxyCODONE-acetaminophen **AND** oxyCODONE, polyethylene glycol ? ?Consultants: ?none ? ?Procedures: ?none ? ?Antibiotics: ?none ? ?Assessment/Plan: ?Principal Problem: ?  Acute sickle cell crisis (HCC) ?Active Problems: ?  Sickle cell pain crisis (HCC) ?  History of pulmonary embolus (PE) ?  Leukocytosis ?  Acute on chronic respiratory failure (HCC) ? ?Respiratory failure with hypoxia: ?Continue supplemental oxygen at 4 L, titrate as tolerated ? ?Sickle cell disease  with pain crisis: ?Continue IV Dilaudid PCA without any changes ?Percocet 10-325 mg every 4 hours as needed for breakthrough pain ?Monitor vital signs very closely, reevaluate pain scale regularly, and supplemental oxygen at 4 L. ? ?Chronic pain syndrome: ?Continue home medications ? ?HIV disease: ?Continue antivirals.CD4 pending.  Patient is followed by infectious disease and [place], [place]. ? ?History of pulmonary embolism: ?Continue apixaban. ? ?Anemia of chronic disease: ?Hemoglobin is 7.5 g/dL, which is below patient's baseline.  Transfuse 1 unit PRBCs  Continue to follow closely.  Labs in AM. ? ?Leukocytosis: ?Stable.  Patient afebrile.  Chest x-ray pending.  Urinalysis  pending.  Follow closely.  Labs in AM. ? ?Viral vs atypical pneumonia: ?Patient remains afebrile Continue antibiotics. Continue supplemental oxygen. ?Tylenol 650 mg every 4 hours as needed for fever ? ? ? ?C

## 2021-08-16 DIAGNOSIS — D72829 Elevated white blood cell count, unspecified: Secondary | ICD-10-CM

## 2021-08-16 DIAGNOSIS — D5700 Hb-SS disease with crisis, unspecified: Secondary | ICD-10-CM | POA: Diagnosis not present

## 2021-08-16 DIAGNOSIS — Z86711 Personal history of pulmonary embolism: Secondary | ICD-10-CM

## 2021-08-16 LAB — BPAM RBC
Blood Product Expiration Date: 202305242359
ISSUE DATE / TIME: 202304281539
Unit Type and Rh: 5100

## 2021-08-16 LAB — HELPER T-LYMPH-CD4 (ARMC ONLY)
% CD 4 Pos. Lymph.: 29.7 % — ABNORMAL LOW (ref 30.8–58.5)
Absolute CD 4 Helper: 2079 /uL — ABNORMAL HIGH (ref 359–1519)
Basophils Absolute: 0 10*3/uL (ref 0.0–0.2)
Basos: 0 %
EOS (ABSOLUTE): 0.7 10*3/uL — ABNORMAL HIGH (ref 0.0–0.4)
Eos: 6 %
Hematocrit: 21.5 % — ABNORMAL LOW (ref 37.5–51.0)
Hemoglobin: 7.8 g/dL — ABNORMAL LOW (ref 13.0–17.7)
Immature Grans (Abs): 0.1 10*3/uL (ref 0.0–0.1)
Immature Granulocytes: 1 %
Lymphocytes Absolute: 7 10*3/uL — ABNORMAL HIGH (ref 0.7–3.1)
Lymphs: 53 %
MCH: 38.2 pg — ABNORMAL HIGH (ref 26.6–33.0)
MCHC: 36.3 g/dL — ABNORMAL HIGH (ref 31.5–35.7)
MCV: 105 fL — ABNORMAL HIGH (ref 79–97)
Monocytes Absolute: 0.9 10*3/uL (ref 0.1–0.9)
Monocytes: 7 %
NRBC: 3 % — ABNORMAL HIGH (ref 0–0)
Neutrophils Absolute: 4.3 10*3/uL (ref 1.4–7.0)
Neutrophils: 33 %
Platelets: 280 10*3/uL (ref 150–450)
RBC: 2.04 x10E6/uL — ABNORMAL LOW (ref 4.14–5.80)
RDW: 22 % — ABNORMAL HIGH (ref 11.6–15.4)
WBC: 13 10*3/uL — ABNORMAL HIGH (ref 3.4–10.8)

## 2021-08-16 LAB — TYPE AND SCREEN
ABO/RH(D): O POS
Antibody Screen: NEGATIVE
Donor AG Type: NEGATIVE
Unit division: 0

## 2021-08-16 MED ORDER — SODIUM CHLORIDE 0.9 % IV SOLN
25.0000 mg | Freq: Once | INTRAVENOUS | Status: AC
  Administered 2021-08-16: 25 mg via INTRAVENOUS
  Filled 2021-08-16: qty 25

## 2021-08-16 MED ORDER — SORBITOL 70 % SOLN
400.0000 mL | TOPICAL_OIL | Freq: Once | ORAL | Status: AC
  Administered 2021-08-16: 400 mL via RECTAL
  Filled 2021-08-16: qty 120

## 2021-08-16 NOTE — Progress Notes (Signed)
Patient ID: Dennison Bulla, male   DOB: Nov 13, 1990, 31 y.o.   MRN: [id number] Subjective: Quincey Sayed is a 31 year old male with a medical history significant for sickle cell disease, chronic pain syndrome, history of HIV, history of pulmonary embolism on Eliquis, and anemia of chronic disease was admitted for sickle cell crisis.  Patient feels slightly better today, he still endorses fatigue but shortness of breath is much improved.  He is on 3 L of oxygen at home but has continued to require 4 L of supplemental oxygen on admission.  Pain intensity is improving.  We will try to get out of bed today.  He denies any cough, chest pain, nausea, vomiting or diarrhea.  He denies any fever or urinary symptoms.  He has not had bowel movement in 4 days but no abdominal distention.  Objective:  Vital signs in last 24 hours:  Vitals:   08/16/21 0304 08/16/21 0658 08/16/21 0734 08/16/21 1050  BP: 100/77 101/70  106/70  Pulse: 92 81  88  Resp: 18 18 14 17   Temp: 97.7 F (36.5 C) 98.4 F (36.9 C)  98.4 F (36.9 C)  TempSrc: Oral Oral  Oral  SpO2: (!) (P) 89% 91% 93% 92%  Weight:      Height:        Intake/Output from previous day:   Intake/Output Summary (Last 24 hours) at 08/16/2021 1115 Last data filed at 08/16/2021 0341 Gross per 24 hour  Intake 1327.93 ml  Output 500 ml  Net 827.93 ml    Physical Exam: General: Alert, awake, oriented x3, in no acute distress.  HEENT: [place]/AT PEERL, EOMI Neck: Trachea midline,  no masses, no thyromegal,y no JVD, no carotid bruit OROPHARYNX:  Moist, No exudate/ erythema/lesions.  Heart: Regular rate and rhythm, without murmurs, rubs, gallops, PMI non-displaced, no heaves or thrills on palpation.  Lungs: Clear to auscultation, no wheezing or rhonchi noted. No increased vocal fremitus resonant to percussion  Abdomen: Soft, nontender, nondistended, positive bowel sounds, no masses no hepatosplenomegaly noted..  Neuro: No focal neurological deficits  noted cranial nerves II through XII grossly intact. DTRs 2+ bilaterally upper and lower extremities. Strength 5 out of 5 in bilateral upper and lower extremities. Musculoskeletal: No warm swelling or erythema around joints, no spinal tenderness noted. Psychiatric: Patient alert and oriented x3, good insight and cognition, good recent to remote recall. Lymph node survey: No cervical axillary or inguinal lymphadenopathy noted.  Lab Results:  Basic Metabolic Panel:    Component Value Date/Time   NA 138 08/12/2021 0519   NA 136 03/18/2021 1232   K 4.4 08/12/2021 0519   CL 107 08/12/2021 0519   CO2 25 08/12/2021 0519   BUN 13 08/12/2021 0519   BUN 11 03/18/2021 1232   CREATININE 0.69 08/12/2021 0519   GLUCOSE 93 08/12/2021 0519   CALCIUM 8.5 (L) 08/12/2021 0519   CBC:    Component Value Date/Time   WBC 13.8 (H) 08/15/2021 0534   HGB 8.6 (L) 08/15/2021 2030   HGB 7.8 (L) 08/14/2021 0500   HCT 22.3 (L) 08/15/2021 2030   HCT 21.5 (L) 08/14/2021 0500   PLT 336 08/15/2021 0534   PLT 280 08/14/2021 0500   MCV 97.5 08/15/2021 0534   MCV 105 (H) 08/14/2021 0500   NEUTROABS 3.8 08/15/2021 0534   NEUTROABS 4.3 08/14/2021 0500   LYMPHSABS 8.4 (H) 08/15/2021 0534   LYMPHSABS 7.0 (H) 08/14/2021 0500   MONOABS 0.9 08/15/2021 0534   EOSABS 0.6 (H) 08/15/2021  0534   EOSABS 0.7 (H) 08/14/2021 0500   BASOSABS 0.0 08/15/2021 0534   BASOSABS 0.0 08/14/2021 0500    Recent Results (from the past 240 hour(s))  Resp Panel by RT-PCR (Flu A&B, Covid) Nasopharyngeal Swab     Status: None   Collection Time: 08/11/21  6:20 AM   Specimen: Nasopharyngeal Swab; Nasopharyngeal(NP) swabs in vial transport medium  Result Value Ref Range Status   SARS Coronavirus 2 by RT PCR NEGATIVE NEGATIVE Final    Comment: (NOTE) SARS-CoV-2 target nucleic acids are NOT DETECTED.  The SARS-CoV-2 RNA is generally detectable in upper respiratory specimens during the acute phase of infection. The lowest concentration  of SARS-CoV-2 viral copies this assay can detect is 138 copies/mL. A negative result does not preclude SARS-Cov-2 infection and should not be used as the sole basis for treatment or other patient management decisions. A negative result may occur with  improper specimen collection/handling, submission of specimen other than nasopharyngeal swab, presence of viral mutation(s) within the areas targeted by this assay, and inadequate number of viral copies(<138 copies/mL). A negative result must be combined with clinical observations, patient history, and epidemiological information. The expected result is Negative.  Fact Sheet for Patients:  BloggerCourse.com  Fact Sheet for Healthcare Providers:  SeriousBroker.it  This test is no t yet approved or cleared by the [place] FDA and  has been authorized for detection and/or diagnosis of SARS-CoV-2 by FDA under an Emergency Use Authorization (EUA). This EUA will remain  in effect (meaning this test can be used) for the duration of the COVID-19 declaration under Section 564(b)(1) of the Act, 21 U.S.C.section 360bbb-3(b)(1), unless the authorization is terminated  or revoked sooner.       Influenza A by PCR NEGATIVE NEGATIVE Final   Influenza B by PCR NEGATIVE NEGATIVE Final    Comment: (NOTE) The Xpert Xpress SARS-CoV-2/FLU/RSV plus assay is intended as an aid in the diagnosis of influenza from Nasopharyngeal swab specimens and should not be used as a sole basis for treatment. Nasal washings and aspirates are unacceptable for Xpert Xpress SARS-CoV-2/FLU/RSV testing.  Fact Sheet for Patients: BloggerCourse.com  Fact Sheet for Healthcare Providers: SeriousBroker.it  This test is not yet approved or cleared by the [place] FDA and has been authorized for detection and/or diagnosis of SARS-CoV-2 by FDA under an Emergency Use  Authorization (EUA). This EUA will remain in effect (meaning this test can be used) for the duration of the COVID-19 declaration under Section 564(b)(1) of the Act, 21 U.S.C. section 360bbb-3(b)(1), unless the authorization is terminated or revoked.  Performed at [facility], [address], [place], [place] [address]     Studies/Results: No results found.  Medications: Scheduled Meds:  abacavir-dolutegravir-lamiVUDine  1 tablet Oral QHS   ALPRAZolam  1 mg Oral QHS   apixaban  5 mg Oral BID   Chlorhexidine Gluconate Cloth  6 each Topical Daily   HYDROmorphone   Intravenous Q4H   hydroxyurea  2,000 mg Oral QHS   senna-docusate  1 tablet Oral BID   sorbitol, milk of mag, mineral oil, glycerin (SMOG) enema  400 mL Rectal Once   Continuous Infusions:  sodium chloride Stopped (08/12/21 2345)   cefTRIAXone (ROCEPHIN)  IV Stopped (08/15/21 2125)   diphenhydrAMINE     PRN Meds:.diphenhydrAMINE, hydrocortisone cream, lip balm, melatonin, naloxone **AND** sodium chloride flush, ondansetron, oxyCODONE-acetaminophen **AND** oxyCODONE, polyethylene glycol  Consultants: None  Procedures: None  Antibiotics: None  Assessment/Plan: Principal Problem:   Acute  sickle cell crisis (HCC) Active Problems:   Sickle cell pain crisis (HCC)   History of pulmonary embolus (PE)   Leukocytosis   Acute on chronic respiratory failure (HCC)  Acute on chronic respiratory failure with hypoxia: Patient still continues to require above baseline supplemental oxygen, currently at 4 L.  Will ambulate patient on 3 L today and begin to wean down to baseline in preparation for anticipated discharge home. Hb Sickle Cell Disease with Pain crisis: Continue IVF at [facility], continue weight based Dilaudid PCA at current dose setting, continue other home pain medications as ordered, Monitor vitals very closely, Re-evaluate pain scale regularly, begin to wean 4 L of Oxygen by [place]. Anemia of Chronic  Disease: Hemoglobin remained stable at baseline today.  There is no clinical indication for blood transfusion at this time.  Patient is status post transfusion of 1 unit of packed red blood cell.  We will continue to monitor closely and repeat labs in AM. Chronic pain Syndrome: Continue home medications History of pulmonary embolism: Continue apixaban. HIV disease: Stable.  Asymptomatic.  Continue medications.  Code Status: Full Code Family Communication: N/A Disposition Plan: Not yet ready for discharge  Gina Leblond  If 7PM-7AM, please contact night-coverage.  08/16/2021, 11:15 AM  LOS: 5 days

## 2021-08-17 ENCOUNTER — Other Ambulatory Visit: Payer: Self-pay | Admitting: Internal Medicine

## 2021-08-17 DIAGNOSIS — J9621 Acute and chronic respiratory failure with hypoxia: Secondary | ICD-10-CM

## 2021-08-17 DIAGNOSIS — G8929 Other chronic pain: Secondary | ICD-10-CM

## 2021-08-17 DIAGNOSIS — D571 Sickle-cell disease without crisis: Secondary | ICD-10-CM | POA: Diagnosis not present

## 2021-08-17 DIAGNOSIS — D5700 Hb-SS disease with crisis, unspecified: Secondary | ICD-10-CM | POA: Diagnosis not present

## 2021-08-17 DIAGNOSIS — Z86711 Personal history of pulmonary embolism: Secondary | ICD-10-CM | POA: Diagnosis not present

## 2021-08-17 LAB — CBC WITH DIFFERENTIAL/PLATELET
Abs Immature Granulocytes: 0.04 10*3/uL (ref 0.00–0.07)
Basophils Absolute: 0 10*3/uL (ref 0.0–0.1)
Basophils Relative: 0 %
Eosinophils Absolute: 0.3 10*3/uL (ref 0.0–0.5)
Eosinophils Relative: 2 %
HCT: 21.1 % — ABNORMAL LOW (ref 39.0–52.0)
Hemoglobin: 7.8 g/dL — ABNORMAL LOW (ref 13.0–17.0)
Immature Granulocytes: 0 %
Lymphocytes Relative: 59 %
Lymphs Abs: 7.4 10*3/uL — ABNORMAL HIGH (ref 0.7–4.0)
MCH: 36.4 pg — ABNORMAL HIGH (ref 26.0–34.0)
MCHC: 37 g/dL — ABNORMAL HIGH (ref 30.0–36.0)
MCV: 98.6 fL (ref 80.0–100.0)
Monocytes Absolute: 0.9 10*3/uL (ref 0.1–1.0)
Monocytes Relative: 7 %
Neutro Abs: 4.1 10*3/uL (ref 1.7–7.7)
Neutrophils Relative %: 32 %
Platelets: 409 10*3/uL — ABNORMAL HIGH (ref 150–400)
RBC: 2.14 MIL/uL — ABNORMAL LOW (ref 4.22–5.81)
RDW: 21.9 % — ABNORMAL HIGH (ref 11.5–15.5)
WBC: 12.8 10*3/uL — ABNORMAL HIGH (ref 4.0–10.5)
nRBC: 10.6 % — ABNORMAL HIGH (ref 0.0–0.2)

## 2021-08-17 LAB — BASIC METABOLIC PANEL
Anion gap: 5 (ref 5–15)
BUN: 11 mg/dL (ref 6–20)
CO2: 28 mmol/L (ref 22–32)
Calcium: 8.9 mg/dL (ref 8.9–10.3)
Chloride: 106 mmol/L (ref 98–111)
Creatinine, Ser: 0.71 mg/dL (ref 0.61–1.24)
GFR, Estimated: >60 mL/min (ref >60)
Glucose, Bld: 88 mg/dL (ref 70–99)
Potassium: 3.8 mmol/L (ref 3.5–5.1)
Sodium: 139 mmol/L (ref 135–145)

## 2021-08-17 MED ORDER — AMOXICILLIN-POT CLAVULANATE 875-125 MG PO TABS
1.0000 | ORAL_TABLET | Freq: Two times a day (BID) | ORAL | 0 refills | Status: DC
  Filled 2021-08-17: qty 14, 7d supply, fill #0

## 2021-08-17 MED ORDER — OXYCODONE-ACETAMINOPHEN 10-325 MG PO TABS: 1.0000 | ORAL_TABLET | ORAL | 0 refills | Status: DC | PRN

## 2021-08-17 MED ORDER — OXYCODONE-ACETAMINOPHEN 10-325 MG PO TABS
1.0000 | ORAL_TABLET | ORAL | 0 refills | Status: DC | PRN
  Filled 2021-08-17: qty 90, 15d supply, fill #0

## 2021-08-17 MED ORDER — AMOXICILLIN-POT CLAVULANATE 875-125 MG PO TABS: 1.0000 | ORAL_TABLET | Freq: Two times a day (BID) | ORAL | 0 refills | Status: AC

## 2021-08-17 NOTE — Discharge Summary (Signed)
Physician Discharge Summary  ?Martin [name] GGE:[id number] DOB: 07-19-1990 DOA: 08/11/2021 ? ?PCP: [name], [name] ? ?Admit date: 08/11/2021 ? ?Discharge date: 08/17/2021 ? ?Discharge Diagnoses:  ?Principal Problem: ?  Acute sickle cell crisis ([place]) ?Active Problems: ?  Sickle cell pain crisis ([place]) ?  History of pulmonary embolus (PE) ?  Leukocytosis ?  Acute on chronic respiratory failure ([place]) ? ?Discharge Condition: Stable ? ?Disposition:  ? Follow-up Information   ? ? [name], [name]. Schedule an appointment as soon as possible for a visit in 1 week(s).   ?Specialty: Family Medicine ?Contact information: ?[address] ?[address] ?[place] [address] ?[phone] ? ? ?  ?  ? ?  ?  ? ?  ? ?Pt is discharged home in good condition and is to follow up with [name], [name] this week to have labs evaluated. Martin [name] is instructed to increase activity slowly and balance with rest for the next few days, and use prescribed medication to complete treatment of pain ? ?Diet: Regular ?Wt Readings from Last 3 Encounters:  ?08/11/21 68 kg  ?08/03/21 68 kg  ?07/28/21 63.5 kg  ? ? ?History of present illness:  ?Martin [name]  is a 31 y.o. male with a medical history significant for sickle cell disease, chronic pain syndrome, opiate dependence and tolerance, chronic respiratory failure with hypoxia, history of HIV, history of PE on Eliquis, and history of anemia of chronic disease presents to the emergency department with complaints of chest pain, bilateral lower extremity, and low back pain that is consistent with his previous sickle cell pain crisis.  Patient was recently admitted on 08/03/2021 for this problem and left AGAINST MEDICAL ADVICE due to a family emergency.  He states that pain intensity has been progressively worsening over the past several days.  Patient states that pain to his chest is sharp and nonradiating, he has been taking Percocet 10-325 mg every 4 hours without any  relief.  Patient denies any dizziness, shortness of breath, urinary symptoms, nausea.  No vomiting, diarrhea, or constipation.  No fever or chills.  Patient is on home oxygen at 3 L. ?  ?ER course: ?Vital signs show temperature 98.2 ?F, BP 103/79, pulse rate 80, respirations 17, and oxygen saturation 96% on 2 L..  Complete blood count shows WBCs 15, hemoglobin 9.1 g/dL, and platelets 232,000.  Complete metabolic panel shows bilirubin elevated at 8.6, otherwise unremarkable.  Chest x-ray shows no acute cardiopulmonary process.  COVID-19 and influenza negative.  Patient's pain persists despite IV Dilaudid and IV fluids.  Admit for further management of sickle cell pain crisis. ? ?Hospital Course:  ?Patient was admitted for sickle cell pain crisis and managed appropriately with IVF, IV Dilaudid via PCA and IV Toradol, as well as other adjunct therapies per sickle cell pain management protocols.  Patient's oxygenation was between 80 and 90% on room air, improved to above 90% but was requiring about 4 L of supplemental oxygen as against his baseline home oxygen requirement of 3 L.  Chest x-ray showed possibility of atypical or viral pneumonia represented by increased interstitial markings in the right lung, no effusions.  Patient had no fever throughout the admissions.  Patient was managed empirically with IV ceftriaxone with significant improvement.  Patient did well, slowly improved in his oxygenation even with ambulation, pain slowly returned to baseline.  As at today, patient is ambulating well saturating well on 3 L of oxygen, tolerating p.o. intake with no significant  restrictions.  Patient requested to be discharged home, also requesting prescription of his home medications for pain.  He was also given a prescription to continue another 5 days of antibiotics, Augmentin. Patient was therefore discharged home today in a hemodynamically stable condition.  Patient was instructed to continue his home Eliquis, to  continue to wear his home oxygen as soon as he gets home at all time.  He will continue his home medications for HIV and encouraged to follow-up with his infectious disease specialist. ? ?Martin [name] will follow-up with PCP within 1 week of this discharge. Martin [name] was counseled extensively about nonpharmacologic means of pain management, patient verbalized understanding and was appreciative of  the care received during this admission.  ? ?We discussed the need for good hydration, monitoring of hydration status, avoidance of heat, cold, stress, and infection triggers. We discussed the need to be adherent with taking Hydrea and other home medications. Patient was reminded of the need to seek medical attention immediately if any symptom of bleeding, anemia, or infection occurs. ? ?Discharge Exam: ?Vitals:  ? 08/17/21 0712 08/17/21 0940  ?BP:  103/66  ?Pulse:  78  ?Resp: 16 14  ?Temp:  98 ?F (36.7 ?C)  ?SpO2: 90% 93%  ? ?Vitals:  ? 08/17/21 0131 08/17/21 3244 08/17/21 0102 08/17/21 0940  ?BP: 118/68 108/69  103/66  ?Pulse: (!) 109 92  78  ?Resp: '16 14 16 14  ' ?Temp: 97.6 ?F (36.4 ?C) 97.9 ?F (36.6 ?C)  98 ?F (36.7 ?C)  ?TempSrc: Oral Oral  Oral  ?SpO2: 90% 91% 90% 93%  ?Weight:      ?Height:      ? ?General appearance : Awake, alert, not in any distress. Speech Clear. Not toxic looking ?HEENT: Atraumatic and Normocephalic, pupils equally reactive to light and accomodation ?Neck: Supple, no JVD. No cervical lymphadenopathy.  ?Chest: Good air entry bilaterally, no added sounds  ?CVS: S1 S2 regular, no murmurs.  ?Abdomen: Bowel sounds present, Non tender and not distended with no gaurding, rigidity or rebound. ?Extremities: B/L Lower Ext shows no edema, both legs are warm to touch ?Neurology: Awake alert, and oriented X 3, CN II-XII intact, Non focal ?Skin: No Rash ? ?Discharge Instructions ? ?Discharge Instructions   ? ? Diet - low sodium heart healthy   Complete by: As directed ?  ? Increase activity slowly   Complete by:  As directed ?  ? ?  ? ?Allergies as of 08/17/2021   ? ?   Reactions  ? Ketorolac Tromethamine Swelling, Other (See Comments)  ? Patient reports facial edema and left arm edema after administration.  ? Tape Rash, Other (See Comments)  ? PLEASE DO NOT USE THE CLEAR, THICK, "PLASTIC" TAPE- only paper tape is tolerated   ? Wound Dressing Adhesive Rash  ? ?  ? ?  ?Medication List  ?  ? ?TAKE these medications   ? ?ALPRAZolam 1 MG tablet ?Commonly known as: Duanne Moron ?Take 1 mg by mouth 2 (two) times daily as needed for anxiety. ?  ?amoxicillin-clavulanate 875-125 MG tablet ?Commonly known as: Augmentin ?Take 1 tablet by mouth every 12 (twelve) hours for 7 days. ?  ?apixaban 5 MG Tabs tablet ?Commonly known as: ELIQUIS ?Take 5 mg by mouth 2 (two) times daily. ?  ?cetirizine 10 MG tablet ?Commonly known as: ZYRTEC ?Take 10 mg by mouth daily as needed for allergies. ?  ?hydroxyurea 500 MG capsule ?Commonly known as: HYDREA ?Take 4 capsules (2,000 mg total) by mouth at bedtime. ?  ?  ibuprofen 600 MG tablet ?Commonly known as: ADVIL ?Take 600 mg by mouth 3 (three) times daily as needed for pain, mild pain or moderate pain. ?  ?melatonin 3 MG Tabs tablet ?Take 6 mg by mouth at bedtime as needed (sleep). ?  ?multivitamin with minerals Tabs tablet ?Take 1 tablet by mouth daily with breakfast. ?  ?naloxone 4 MG/0.1ML Liqd nasal spray kit ?Commonly known as: NARCAN ?Place 4 mg into the nose as needed for opioid reversal. ?  ?oxyCODONE-acetaminophen 10-325 MG tablet ?Commonly known as: Percocet ?Take 1 tablet by mouth every 4 (four) hours as needed for up to 15 days for pain. ?  ?Triumeq 600-50-300 MG tablet ?Generic drug: abacavir-dolutegravir-lamiVUDine ?Take 1 tablet by mouth at bedtime. ?  ? ?  ? ? ?The results of significant diagnostics from this hospitalization (including imaging, microbiology, ancillary and laboratory) are listed below for reference.   ? ?Significant Diagnostic Studies: ?DG Chest 2 View ? ?Result Date:  08/12/2021 ?CLINICAL DATA:  Sickle cell crisis.  HIV EXAM: CHEST - 2 VIEW COMPARISON:  08/11/2021 FINDINGS: Heart size and vascularity normal. Increased lung markings are present on the right which have developed since

## 2021-08-17 NOTE — Progress Notes (Signed)
Patient discharged to home, discharge instructions reviewed with patient who verbalized understanding.  

## 2021-08-18 ENCOUNTER — Other Ambulatory Visit (HOSPITAL_COMMUNITY): Payer: Self-pay

## 2021-08-18 LAB — HELPER T-LYMPH-CD4 (ARMC ONLY)

## 2021-08-29 ENCOUNTER — Other Ambulatory Visit (HOSPITAL_COMMUNITY): Payer: Self-pay

## 2021-08-29 ENCOUNTER — Other Ambulatory Visit: Payer: Self-pay | Admitting: Family Medicine

## 2021-08-29 ENCOUNTER — Telehealth: Payer: Self-pay | Admitting: Family Medicine

## 2021-08-29 DIAGNOSIS — D571 Sickle-cell disease without crisis: Secondary | ICD-10-CM

## 2021-08-29 DIAGNOSIS — G8929 Other chronic pain: Secondary | ICD-10-CM

## 2021-08-29 MED ORDER — OXYCODONE-ACETAMINOPHEN 10-325 MG PO TABS
1.0000 | ORAL_TABLET | ORAL | 0 refills | Status: DC | PRN
  Filled 2021-09-01: qty 90, 15d supply, fill #0

## 2021-08-29 NOTE — Progress Notes (Signed)
Reviewed PDMP substance reporting system prior to prescribing opiate medications. No inconsistencies noted.   Meds ordered this encounter  Medications  . oxyCODONE-acetaminophen (PERCOCET) 10-325 MG tablet    Sig: Take 1 tablet by mouth every 4 (four) hours as needed for up to 15 days for pain.    Dispense:  90 tablet    Refill:  0    Order Specific Question:   Supervising Provider    Answer:   JEGEDE, OLUGBEMIGA E [[id number]]    Martin Pizzo Moore Brodee Mauritz  APRN, [name], [name] [facility] [address]  [place], [place] [address] [phone]  

## 2021-08-29 NOTE — Telephone Encounter (Signed)
Oxycodone refill request.

## 2021-09-01 ENCOUNTER — Inpatient Hospital Stay (HOSPITAL_COMMUNITY)
Admission: EM | Admit: 2021-09-01 | Discharge: 2021-09-03 | DRG: 812 | Disposition: A | Discharge status: Home / Self Care | Payer: Medicaid Other | Attending: Internal Medicine | Admitting: Internal Medicine

## 2021-09-01 ENCOUNTER — Other Ambulatory Visit: Payer: Self-pay

## 2021-09-01 ENCOUNTER — Emergency Department (HOSPITAL_COMMUNITY): Payer: Medicaid Other

## 2021-09-01 ENCOUNTER — Encounter (HOSPITAL_COMMUNITY): Payer: Self-pay | Admitting: Emergency Medicine

## 2021-09-01 ENCOUNTER — Other Ambulatory Visit (HOSPITAL_COMMUNITY): Payer: Self-pay

## 2021-09-01 DIAGNOSIS — Z9981 Dependence on supplemental oxygen: Secondary | ICD-10-CM

## 2021-09-01 DIAGNOSIS — J9620 Acute and chronic respiratory failure, unspecified whether with hypoxia or hypercapnia: Secondary | ICD-10-CM | POA: Diagnosis present

## 2021-09-01 DIAGNOSIS — B20 Human immunodeficiency virus [HIV] disease: Secondary | ICD-10-CM | POA: Diagnosis present

## 2021-09-01 DIAGNOSIS — Z7901 Long term (current) use of anticoagulants: Secondary | ICD-10-CM | POA: Diagnosis not present

## 2021-09-01 DIAGNOSIS — Z91199 Patient's noncompliance with other medical treatment and regimen due to unspecified reason: Secondary | ICD-10-CM | POA: Diagnosis not present

## 2021-09-01 DIAGNOSIS — D638 Anemia in other chronic diseases classified elsewhere: Secondary | ICD-10-CM | POA: Diagnosis present

## 2021-09-01 DIAGNOSIS — D72829 Elevated white blood cell count, unspecified: Secondary | ICD-10-CM | POA: Diagnosis present

## 2021-09-01 DIAGNOSIS — F419 Anxiety disorder, unspecified: Secondary | ICD-10-CM | POA: Diagnosis present

## 2021-09-01 DIAGNOSIS — Z20822 Contact with and (suspected) exposure to covid-19: Secondary | ICD-10-CM | POA: Diagnosis present

## 2021-09-01 DIAGNOSIS — D5700 Hb-SS disease with crisis, unspecified: Principal | ICD-10-CM | POA: Diagnosis present

## 2021-09-01 DIAGNOSIS — D5701 Hb-SS disease with acute chest syndrome: Secondary | ICD-10-CM

## 2021-09-01 DIAGNOSIS — J9611 Chronic respiratory failure with hypoxia: Secondary | ICD-10-CM | POA: Diagnosis present

## 2021-09-01 DIAGNOSIS — Z86711 Personal history of pulmonary embolism: Secondary | ICD-10-CM | POA: Diagnosis not present

## 2021-09-01 DIAGNOSIS — G894 Chronic pain syndrome: Secondary | ICD-10-CM | POA: Diagnosis present

## 2021-09-01 DIAGNOSIS — Z79899 Other long term (current) drug therapy: Secondary | ICD-10-CM

## 2021-09-01 DIAGNOSIS — J9621 Acute and chronic respiratory failure with hypoxia: Secondary | ICD-10-CM | POA: Diagnosis present

## 2021-09-01 DIAGNOSIS — Z832 Family history of diseases of the blood and blood-forming organs and certain disorders involving the immune mechanism: Secondary | ICD-10-CM

## 2021-09-01 DIAGNOSIS — Z888 Allergy status to other drugs, medicaments and biological substances status: Secondary | ICD-10-CM | POA: Diagnosis not present

## 2021-09-01 DIAGNOSIS — J9610 Chronic respiratory failure, unspecified whether with hypoxia or hypercapnia: Secondary | ICD-10-CM

## 2021-09-01 DIAGNOSIS — Z21 Asymptomatic human immunodeficiency virus [HIV] infection status: Secondary | ICD-10-CM | POA: Diagnosis present

## 2021-09-01 LAB — CBC WITH DIFFERENTIAL/PLATELET
Abs Immature Granulocytes: 0.04 10*3/uL (ref 0.00–0.07)
Basophils Absolute: 0 10*3/uL (ref 0.0–0.1)
Basophils Relative: 0 %
Eosinophils Absolute: 0.2 10*3/uL (ref 0.0–0.5)
Eosinophils Relative: 2 %
HCT: 26 % — ABNORMAL LOW (ref 39.0–52.0)
Hemoglobin: 9.6 g/dL — ABNORMAL LOW (ref 13.0–17.0)
Immature Granulocytes: 0 %
Lymphocytes Relative: 51 %
Lymphs Abs: 6.1 10*3/uL — ABNORMAL HIGH (ref 0.7–4.0)
MCH: 37.2 pg — ABNORMAL HIGH (ref 26.0–34.0)
MCHC: 36.9 g/dL — ABNORMAL HIGH (ref 30.0–36.0)
MCV: 100.8 fL — ABNORMAL HIGH (ref 80.0–100.0)
Monocytes Absolute: 1.6 10*3/uL — ABNORMAL HIGH (ref 0.1–1.0)
Monocytes Relative: 13 %
Neutro Abs: 4.1 10*3/uL (ref 1.7–7.7)
Neutrophils Relative %: 34 %
Platelets: 312 10*3/uL (ref 150–400)
RBC: 2.58 MIL/uL — ABNORMAL LOW (ref 4.22–5.81)
RDW: 21.7 % — ABNORMAL HIGH (ref 11.5–15.5)
WBC: 12.1 10*3/uL — ABNORMAL HIGH (ref 4.0–10.5)
nRBC: 5.8 % — ABNORMAL HIGH (ref 0.0–0.2)

## 2021-09-01 LAB — COMPREHENSIVE METABOLIC PANEL
ALT: 11 U/L (ref 0–44)
AST: 25 U/L (ref 15–41)
Albumin: 4 g/dL (ref 3.5–5.0)
Alkaline Phosphatase: 65 U/L (ref 38–126)
Anion gap: 8 (ref 5–15)
BUN: 12 mg/dL (ref 6–20)
CO2: 23 mmol/L (ref 22–32)
Calcium: 8.6 mg/dL — ABNORMAL LOW (ref 8.9–10.3)
Chloride: 105 mmol/L (ref 98–111)
Creatinine, Ser: 0.93 mg/dL (ref 0.61–1.24)
GFR, Estimated: >60 mL/min (ref >60)
Glucose, Bld: 98 mg/dL (ref 70–99)
Potassium: 3.5 mmol/L (ref 3.5–5.1)
Sodium: 136 mmol/L (ref 135–145)
Total Bilirubin: 13.6 mg/dL — ABNORMAL HIGH (ref 0.3–1.2)
Total Protein: 8.9 g/dL — ABNORMAL HIGH (ref 6.5–8.1)

## 2021-09-01 LAB — RETICULOCYTES
Immature Retic Fract: 27.1 % — ABNORMAL HIGH (ref 2.3–15.9)
RBC.: 2.43 MIL/uL — ABNORMAL LOW (ref 4.22–5.81)
Retic Count, Absolute: 144.9 10*3/uL (ref 19.0–186.0)
Retic Ct Pct: 26.8 % — ABNORMAL HIGH (ref 0.4–3.1)

## 2021-09-01 MED ORDER — HYDROMORPHONE HCL 2 MG/ML IJ SOLN
2.0000 mg | INTRAMUSCULAR | Status: AC
  Administered 2021-09-01: 2 mg via INTRAVENOUS
  Filled 2021-09-01: qty 1

## 2021-09-01 MED ORDER — HYDROMORPHONE 1 MG/ML IV SOLN
INTRAVENOUS | Status: DC
  Administered 2021-09-01: 30 mg via INTRAVENOUS
  Administered 2021-09-01: 6 mg via INTRAVENOUS
  Administered 2021-09-01: 10 mg via INTRAVENOUS
  Administered 2021-09-02: 5 mg via INTRAVENOUS
  Administered 2021-09-02: 30 mg via INTRAVENOUS
  Administered 2021-09-02: 7.5 mg via INTRAVENOUS
  Administered 2021-09-02: 4.5 mg via INTRAVENOUS
  Filled 2021-09-01 (×2): qty 30

## 2021-09-01 MED ORDER — HYDROXYUREA 500 MG PO CAPS
2000.0000 mg | ORAL_CAPSULE | Freq: Every day | ORAL | Status: DC
  Administered 2021-09-01 – 2021-09-02 (×2): 2000 mg via ORAL
  Filled 2021-09-01 (×2): qty 4

## 2021-09-01 MED ORDER — POLYETHYLENE GLYCOL 3350 17 G PO PACK: 17.0000 g | PACK | Freq: Every day | ORAL | Status: DC | PRN

## 2021-09-01 MED ORDER — SODIUM CHLORIDE 0.9 % IV SOLN
2.0000 g | Freq: Once | INTRAVENOUS | Status: AC
  Administered 2021-09-01: 2 g via INTRAVENOUS
  Filled 2021-09-01: qty 20

## 2021-09-01 MED ORDER — CHLORHEXIDINE GLUCONATE CLOTH 2 % EX PADS
6.0000 | MEDICATED_PAD | Freq: Every day | CUTANEOUS | Status: DC
  Administered 2021-09-02 – 2021-09-03 (×3): 6 via TOPICAL

## 2021-09-01 MED ORDER — APIXABAN 5 MG PO TABS
5.0000 mg | ORAL_TABLET | Freq: Two times a day (BID) | ORAL | Status: DC
  Administered 2021-09-01 – 2021-09-03 (×5): 5 mg via ORAL
  Filled 2021-09-01 (×5): qty 1

## 2021-09-01 MED ORDER — NALOXONE HCL 0.4 MG/ML IJ SOLN
0.4000 mg | INTRAMUSCULAR | Status: DC | PRN
Start: 2021-09-01 — End: 2021-09-03

## 2021-09-01 MED ORDER — ABACAVIR-DOLUTEGRAVIR-LAMIVUD 600-50-300 MG PO TABS
1.0000 | ORAL_TABLET | Freq: Every day | ORAL | Status: DC
  Administered 2021-09-01 – 2021-09-02 (×2): 1 via ORAL
  Filled 2021-09-01 (×2): qty 1

## 2021-09-01 MED ORDER — HYDROMORPHONE HCL 1 MG/ML IJ SOLN
1.0000 mg | INTRAMUSCULAR | Status: DC | PRN
  Administered 2021-09-01: 1 mg via INTRAVENOUS
  Filled 2021-09-01: qty 1

## 2021-09-01 MED ORDER — SODIUM CHLORIDE 0.9 % IV SOLN
12.5000 mg | Freq: Once | INTRAVENOUS | Status: AC
  Administered 2021-09-01: 12.5 mg via INTRAVENOUS
  Filled 2021-09-01: qty 12.5

## 2021-09-01 MED ORDER — SODIUM CHLORIDE 0.9% FLUSH: 9.0000 mL | INTRAVENOUS | Status: DC | PRN

## 2021-09-01 MED ORDER — SENNOSIDES-DOCUSATE SODIUM 8.6-50 MG PO TABS
1.0000 | ORAL_TABLET | Freq: Two times a day (BID) | ORAL | Status: DC
  Administered 2021-09-02 – 2021-09-03 (×2): 1 via ORAL
  Filled 2021-09-01 (×5): qty 1

## 2021-09-01 MED ORDER — MELATONIN 3 MG PO TABS
6.0000 mg | ORAL_TABLET | Freq: Every evening | ORAL | Status: DC | PRN
  Filled 2021-09-01: qty 2

## 2021-09-01 MED ORDER — ONDANSETRON HCL 4 MG/2ML IJ SOLN
4.0000 mg | INTRAMUSCULAR | Status: DC | PRN
  Administered 2021-09-03: 4 mg via INTRAVENOUS
  Filled 2021-09-01 (×2): qty 2

## 2021-09-01 MED ORDER — SODIUM CHLORIDE 0.9 % IV SOLN
500.0000 mg | Freq: Once | INTRAVENOUS | Status: AC
  Administered 2021-09-01: 500 mg via INTRAVENOUS
  Filled 2021-09-01: qty 5

## 2021-09-01 NOTE — ED Notes (Signed)
Pt transported to XR.  

## 2021-09-01 NOTE — ED Triage Notes (Signed)
Pt reports SSC pain "everywhere." States that his home meds havent worked. O2 at 78% on arrival. ?

## 2021-09-01 NOTE — H&P (Signed)
?H&P ? Patient Demographics:  ?Martin [name], is a 31 y.o. male  MRN: [id number]   DOB - 08/27/1990 ? ?Admit Date - 09/01/2021 ? ?Outpatient Primary MD for the patient is Massie MaroonHollis, Reeshemah Nazaryan M, [name] ? ?Chief Complaint  ?Patient presents with  ? Sickle Cell Anemia  ?  ? ? HPI:  ? ?Martin [name]  is a 31 y.o. male with a medical history significant for sickle cell disease, chronic pain syndrome, opiate dependence and tolerance, history of PE on Eliquis, chronic respiratory failure with hypoxia, history of HIV, and anemia of chronic disease presents to the emergency department with complaints of pain to chest, low back, and lower extremity pain. Pain has been escalating over the past several days and unrelieved by his home medications. He has not identified any aggravating or alleviating factors concerning crisis. He characterizes pain as generalized, constant, and aching. He says that pain is consistent with previous sickle cell pain crises. Patient denies headache, dizziness, or shortness of breath. No urinary symptoms, nausea, vomiting, or diarrhea. No recent travel of exposure as COVID-19.  ?The patient's last hospitalization was 4/24 - 4/30, patient was treated for acute chest syndrome and atypical pneumonia.  He was discharged with Augmentin, patient states that he completed antibiotic. ? ?ED Course:  ?Vital signs show: BP 109/75   Pulse 90   Temp 98.2 ?F (36.8 ?C) (Oral)   Resp 18   SpO2 (!) 89%  ? ?Complete blood count shows WBCs 12.1, hemoglobin 9.6, and platelets 312.  Complete metabolic panel shows bilirubin markedly elevated at 13.6, patient's bilirubin is chronically elevated.  CMP otherwise unremarkable.  Chest x-ray shows chronic imaging stigmata of sickle cell disease and there is an ill-defined airspace consolidation in the right lower lobe that is concerning for acute chest syndrome.  Patient has chronic hypoxia and is typically on 2-3 L supplemental oxygen at home.  Oxygen saturation is ranging  between 88-92% on 3 L.  COVID-19 and influenza negative.  Pain persists despite IV Dilaudid and IV fluids.  Patient will be admitted to MedSurg for further management of sickle cell pain crisis. ? Review of systems:  ?Review of Systems  ?Constitutional:  Negative for chills and fever.  ?HENT: Negative.    ?Eyes: Negative.   ?Respiratory: Negative.    ?Cardiovascular: Negative.   ?Gastrointestinal: Negative.   ?Genitourinary: Negative.   ?Musculoskeletal:  Positive for back pain and joint pain.  ?Skin: Negative.   ?Neurological: Negative.   ?Psychiatric/Behavioral: Negative.    ? ? ?With Past History of the following :  ? ?Past Medical History:  ?Diagnosis Date  ? Anxiety   ? HIV (human immunodeficiency virus infection) (HCC)   ? Proteinuria   ? 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H[facility])   ? Vitamin D deficiency 10/2018  ?   ? ?Past Surgical History:  ?Procedure Laterality Date  ? IR IMAGING GUIDED PORT INSERTION  08/29/2019  ? IR REMOVAL TUN ACCESS W/ PORT W/O FL MOD SED  08/30/2020  ? TEE WITHOUT CARDIOVERSION N/A 08/30/2020  ? Procedure: TRANSESOPHAGEAL ECHOCARDIOGRAM (TEE);  Surgeon: Chrystie NoseHilty, Kenneth C, MD;  [facility];  Service: Cardiovascular;  Laterality: N/A;  ? ? ? Social History:  ? ?Social History  ? ?Tobacco Use  ? Smoking status: Never  ? Smokeless tobacco: Never  ?Substance Use Topics  ? Alcohol use: No  ?  ? ?Lives - At home ? ? Family History :  ? ?Family History  ?Problem Relation Age of Onset  ? Sickle cell trait  Mother   ? Sickle cell trait Father   ? Birth defects Maternal Grandmother   ? Birth defects Paternal Grandmother   ? ? ? Home Medications:  ? ?Prior to Admission medications   ?Medication Sig Start Date End Date Taking? Authorizing Provider  ?abacavir-dolutegravir-lamiVUDine (TRIUMEQ) 600-50-300 MG tablet Take 1 tablet by mouth at bedtime.   Yes [provider]  ?ALPRAZolam Prudy Feeler) 1 MG tablet Take 1 mg by mouth 2 (two) times daily as needed for anxiety.   Yes [provider]   ?apixaban (ELIQUIS) 5 MG TABS tablet Take 5 mg by mouth 2 (two) times daily.   Yes [provider]  ?cetirizine (ZYRTEC) 10 MG tablet Take 10 mg by mouth daily as needed for allergies.   Yes [provider]  ?hydroxyurea (HYDREA) 500 MG capsule Take 4 capsules (2,000 mg total) by mouth at bedtime. 10/07/20  Yes Massie Maroon, [name]  ?melatonin 3 MG TABS tablet Take 6 mg by mouth at bedtime as needed (sleep). 03/04/21  Yes [provider]  ?oxyCODONE-acetaminophen (PERCOCET) 10-325 MG tablet Take 1 tablet by mouth every 4 (four) hours as needed for up to 15 days for pain. 09/01/21 09/16/21 Yes Massie Maroon, [name]  ?naloxone [facility]) nasal spray 4 mg/0.1 mL Place 4 mg into the nose as needed for opioid reversal. 05/19/17   [provider]  ? ? ? Allergies:  ? ?Allergies  ?Allergen Reactions  ? Ketorolac Tromethamine Swelling and Other (See Comments)  ?  Patient reports facial edema and left arm edema after administration.  ? Tape Rash and Other (See Comments)  ?  PLEASE DO NOT USE THE CLEAR, THICK, "PLASTIC" TAPE- only paper tape is tolerated   ? Wound Dressing Adhesive Rash  ? ? ? Physical Exam:  ? ?Vitals:  ? ?Vitals:  ? 09/01/21 0900 09/01/21 0915  ?BP: 108/75 109/75  ?Pulse: 89 90  ?Resp: 14 18  ?Temp:    ?SpO2: 91% (!) 89%  ? ? ?Physical Exam: ?Constitutional: Patient appears well-developed and well-nourished. Not in obvious distress. ?HENT: Normocephalic, atraumatic, External right and left ear normal. Oropharynx is clear and moist.  ?Eyes: Conjunctivae and EOM are normal. PERRLA, no scleral icterus. ?Neck: Normal ROM. Neck supple. No JVD. No tracheal deviation. No thyromegaly. ?CVS: RRR, S1/S2 +, no murmurs, no gallops, no carotid bruit.  ?Pulmonary: Effort and breath sounds normal, no stridor, rhonchi, wheezes, rales.  ?Abdominal: Soft. BS +, no distension, tenderness, rebound or guarding.  ?Musculoskeletal: Normal range of motion. No edema and no tenderness.   ?Lymphadenopathy: No lymphadenopathy noted, cervical, inguinal or axillary ?Neuro: Alert. Normal reflexes, muscle tone coordination. No cranial nerve deficit. ?Skin: Skin is warm and dry. No rash noted. Not diaphoretic. No erythema. No pallor. ?Psychiatric: Normal mood and affect. Behavior, judgment, thought content normal. ? ? Data Review:  ? ?CBC ?Recent Labs  ?Lab 09/01/21 ?0606  ?WBC 12.1*  ?HGB 9.6*  ?HCT 26.0*  ?PLT 312  ?MCV 100.8*  ?MCH 37.2*  ?MCHC 36.9*  ?RDW 21.7*  ?LYMPHSABS 6.1*  ?MONOABS 1.6*  ?EOSABS 0.2  ?BASOSABS 0.0  ? ?------------------------------------------------------------------------------------------------------------------ ? ?Chemistries  ?Recent Labs  ?Lab 09/01/21 ?0606  ?NA 136  ?K 3.5  ?CL 105  ?CO2 23  ?GLUCOSE 98  ?BUN 12  ?CREATININE 0.93  ?CALCIUM 8.6*  ?AST 25  ?ALT 11  ?ALKPHOS 65  ?BILITOT 13.6*  ? ?------------------------------------------------------------------------------------------------------------------ ?CrCl cannot be calculated (Unknown ideal weight.). ?------------------------------------------------------------------------------------------------------------------ ?No results for input(s): TSH, T4TOTAL, T3FREE, THYROIDAB in  the last 72 hours. ? ?Invalid input(s): FREET3 ? ?Coagulation profile ?No results for input(s): INR, PROTIME in the last 168 hours. ?------------------------------------------------------------------------------------------------------------------- ?No results for input(s): DDIMER in the last 72 hours. ?------------------------------------------------------------------------------------------------------------------- ? ?Cardiac Enzymes ?No results for input(s): CKMB, TROPONINI, MYOGLOBIN in the last 168 hours. ? ?Invalid input(s): CK ?------------------------------------------------------------------------------------------------------------------ ?   ?Component Value Date/Time  ? BNP 91.4 05/27/2021 2216   ? ? ?--------------------------------------------------------------------------------------------------------------- ? ?Urinalysis ?   ?Component Value Date/Time  ? COLORURINE YELLOW 08/03/2021 1808  ? APPEARANCEUR CLEAR 08/03/2021 1808  ? LABSPEC 1.011 04/16/

## 2021-09-01 NOTE — ED Notes (Signed)
Pt placed on 4L in triage. ?

## 2021-09-01 NOTE — ED Provider Notes (Signed)
Care handoff from [facility], PA-C at shift change. Please see their note for further information. ? ?Briefly: Patient with sickle cell disease here for sickle cell pain crisis. Initially found to be hypoxic in the 70s on room air on arrival, however stated that he supposed to be is on 2L [place] at baseline.  ? ?Plan: Labs and CXR pending, first round of meds given. Will reassess to determine dispo ? ?CXR reveals 'In additional to chronic imaging stigmata of sickle cell disease there is ill-defined airspace consolidation in the right lower lobe concerning for acute chest syndrome.' I have personally reviewed and evaluated these images and agree with radiology interpretation. ? ?Labs show WBC 12.1, hemoglobin 9.6 both improved from previous. T bili 13.6. No other acute laboratory findings.  ? ?Upon reassessment, patient is currently satting at 93% on 5L [place], actively denies any chest pain or shortness of breath. After 3 rounds of dilaudid, patient continues to endorse pain. Given Rocephin and Azithromycin for acute chest management. Will seek admission for ongoing treatment. Patient is in agreement with treatment plan. Discussed patient with [place] Hollis, plan to admit for ongoing treatment. ? ? ?Findings and plan of care discussed with supervising physician Dr. Jeanell Sparrow who is in agreement.  ? ? ?  ?Bud Face, PA-C ?09/01/21 [id number] ? ?  ?Pattricia Boss, MD ?09/02/21 [phone] ? ?

## 2021-09-01 NOTE — ED Provider Notes (Signed)
?[facility] DEPT ?Provider Note ? ? ?CSN: [id number] ?Arrival date & time: 09/01/21  [id number] ? ?  ? ?History ? ?Chief Complaint  ?Patient presents with  ? Sickle Cell Anemia  ? ? ?Martin [name] is a 31 y.o. male with history of sickle cell anemia, HIV, prior PE on Eliquis, chronic hypoxic respiratory failure on 2 to 3 L via nasal cannula at home who presents to the emergency department with complaints of sickle cell pain crisis that began last night.  Patient reports the pain is generalized/all over, constant, no alleviating or aggravating factors, taking Percocet tens at home without relief.  Feels similar to prior sickle cell pain crises.  Denies fever, chills, cough, chest pain, shortness of breath, nausea, vomiting, or diarrhea. ? ?HPI ? ?  ? ?Home Medications ?Prior to Admission medications   ?Medication Sig Start Date End Date Taking? Authorizing Provider  ?abacavir-dolutegravir-lamiVUDine (TRIUMEQ) 600-50-300 MG tablet Take 1 tablet by mouth at bedtime.   Yes [provider]  ?ALPRAZolam Duanne Moron) 1 MG tablet Take 1 mg by mouth 2 (two) times daily as needed for anxiety.   Yes [provider]  ?apixaban (ELIQUIS) 5 MG TABS tablet Take 5 mg by mouth 2 (two) times daily.   Yes [provider]  ?cetirizine (ZYRTEC) 10 MG tablet Take 10 mg by mouth daily as needed for allergies.   Yes [provider]  ?hydroxyurea (HYDREA) 500 MG capsule Take 4 capsules (2,000 mg total) by mouth at bedtime. 10/07/20  Yes [name], [name]  ?melatonin 3 MG TABS tablet Take 6 mg by mouth at bedtime as needed (sleep). 03/04/21  Yes [provider]  ?oxyCODONE-acetaminophen (PERCOCET) 10-325 MG tablet Take 1 tablet by mouth every 4 (four) hours as needed for up to 15 days for pain. 09/01/21 09/16/21 Yes [name], [name]  ?naloxone [facility]) nasal spray 4 mg/0.1 mL Place 4 mg into the nose as needed for opioid reversal. 05/19/17   [provider]   ?   ? ?Allergies    ?Ketorolac tromethamine, Tape, and Wound dressing adhesive   ? ?Review of Systems   ?Review of Systems  ?Constitutional:  Negative for chills and fever.  ?Respiratory:  Negative for cough and shortness of breath.   ?Cardiovascular:  Negative for chest pain.  ?Gastrointestinal:  Negative for diarrhea and vomiting.  ?Musculoskeletal:  Positive for arthralgias and myalgias.  ?All other systems reviewed and are negative. ? ?Physical Exam ?Updated Vital Signs ?BP 110/72 (BP Location: Right Arm)   Pulse 96   Temp 98.2 ?F (36.8 ?C) (Oral)   Resp 18   SpO2 96%  ?Physical Exam ?Vitals and nursing note reviewed.  ?Constitutional:   ?   General: He is not in acute distress. ?   Appearance: He is well-developed. He is not toxic-appearing.  ?HENT:  ?   Head: Normocephalic and atraumatic.  ?Eyes:  ?   General:     ?   Right eye: No discharge.     ?   Left eye: No discharge.  ?   Conjunctiva/sclera: Conjunctivae normal.  ?Cardiovascular:  ?   Rate and Rhythm: Normal rate and regular rhythm.  ?   Comments: 2+ symmetric radial and DP pulses bilaterally. ?Pulmonary:  ?   Effort: No respiratory distress.  ?   Breath sounds: Normal breath sounds. No wheezing or rales.  ?   Comments: SPO2 82% on room air on arrival, currently on 4 L nasal cannula satting  in the 90s, decreased to 3 L nasal cannula at this point patient wears at home, no signs of respiratory distress currently ?Abdominal:  ?   General: There is no distension.  ?   Palpations: Abdomen is soft.  ?   Tenderness: There is no abdominal tenderness.  ?Musculoskeletal:  ?   Cervical back: Neck supple.  ?   Comments: Upper/lower extremities: No significant erythema or obvious deformities.  Actively ranging bilateral upper and lower extremities at all major joints.  Diffusely tender without focal bony tenderness.  Compartments are soft.  ?Skin: ?   General: Skin is warm and dry.  ?Neurological:  ?   Mental Status: He is alert.  ?   Comments: Clear speech.   Strength and sensation grossly intact x4.  ?Psychiatric:     ?   Behavior: Behavior normal.  ? ? ?ED Results / Procedures / Treatments   ?Labs ?(all labs ordered are listed, but only abnormal results are displayed) ?Labs Reviewed  ?CBC WITH DIFFERENTIAL/PLATELET  ?COMPREHENSIVE METABOLIC PANEL  ?RETICULOCYTES  ? ? ?EKG ?None ? ?Radiology ?No results found. ? ?Procedures ?Procedures  ? ? ?Medications Ordered in ED ?Medications  ?HYDROmorphone (DILAUDID) injection 2 mg (has no administration in time range)  ?HYDROmorphone (DILAUDID) injection 2 mg (has no administration in time range)  ?HYDROmorphone (DILAUDID) injection 2 mg (has no administration in time range)  ?diphenhydrAMINE (BENADRYL) 12.5 mg in sodium chloride 0.9 % 50 mL IVPB (has no administration in time range)  ?ondansetron (ZOFRAN) injection 4 mg (has no administration in time range)  ? ? ?ED Course/ Medical Decision Making/ A&P ?  ?                        ?Medical Decision Making ?Amount and/or Complexity of Data Reviewed ?Labs: ordered. ?Radiology: ordered. ? ?Risk ?Prescription drug management. ? ? ?Patient presents to the emergency department with reports of sickle cell pain crisis.  Nontoxic, hypoxic on room air on arrival, however patient reports that they do wear oxygen at home, saturating well on 3 L via nasal cannula at this time without findings of respiratory distress.  No complaints of fever, cough, dyspnea, or chest pain.  Will check chest x-ray in addition to labs.  Sickle cell pain crises protocol initiated. ? ?06:30: Patient care signed out to PA Smoot @ shift change pending labs, re-assessment & disposition.  ? ? ? ? ? ? ? ?Final Clinical Impression(s) / ED Diagnoses ?Final diagnoses:  ?None  ? ? ?Rx / DC Orders ?ED Discharge Orders   ? ? None  ? ?  ? ? ?  ?Amaryllis Dyke, [place] ?09/01/21 [id number] ? ?  ?Maudie Flakes, MD ?09/01/21 0715 ? ?

## 2021-09-02 DIAGNOSIS — D5700 Hb-SS disease with crisis, unspecified: Secondary | ICD-10-CM

## 2021-09-02 LAB — CBC WITH DIFFERENTIAL/PLATELET
Abs Immature Granulocytes: 0.05 10*3/uL (ref 0.00–0.07)
Basophils Absolute: 0 10*3/uL (ref 0.0–0.1)
Basophils Relative: 0 %
Eosinophils Absolute: 0.4 10*3/uL (ref 0.0–0.5)
Eosinophils Relative: 4 %
HCT: 23.2 % — ABNORMAL LOW (ref 39.0–52.0)
Hemoglobin: 8.8 g/dL — ABNORMAL LOW (ref 13.0–17.0)
Immature Granulocytes: 1 %
Lymphocytes Relative: 40 %
Lymphs Abs: 4.1 10*3/uL — ABNORMAL HIGH (ref 0.7–4.0)
MCH: 37.9 pg — ABNORMAL HIGH (ref 26.0–34.0)
MCHC: 37.9 g/dL — ABNORMAL HIGH (ref 30.0–36.0)
MCV: 100 fL (ref 80.0–100.0)
Monocytes Absolute: 1.1 10*3/uL — ABNORMAL HIGH (ref 0.1–1.0)
Monocytes Relative: 11 %
Neutro Abs: 4.6 10*3/uL (ref 1.7–7.7)
Neutrophils Relative %: 44 %
Platelets: 273 10*3/uL (ref 150–400)
RBC: 2.32 MIL/uL — ABNORMAL LOW (ref 4.22–5.81)
RDW: 21.4 % — ABNORMAL HIGH (ref 11.5–15.5)
WBC: 10.3 10*3/uL (ref 4.0–10.5)
nRBC: 5.6 % — ABNORMAL HIGH (ref 0.0–0.2)

## 2021-09-02 LAB — HEPATIC FUNCTION PANEL
ALT: 11 U/L (ref 0–44)
AST: 20 U/L (ref 15–41)
Albumin: 3.8 g/dL (ref 3.5–5.0)
Alkaline Phosphatase: 56 U/L (ref 38–126)
Bilirubin, Direct: 0.2 mg/dL (ref 0.0–0.2)
Indirect Bilirubin: 7.4 mg/dL — ABNORMAL HIGH (ref 0.3–0.9)
Total Bilirubin: 7.6 mg/dL — ABNORMAL HIGH (ref 0.3–1.2)
Total Protein: 8.4 g/dL — ABNORMAL HIGH (ref 6.5–8.1)

## 2021-09-02 LAB — TYPE AND SCREEN
ABO/RH(D): O POS
Antibody Screen: NEGATIVE

## 2021-09-02 MED ORDER — ALPRAZOLAM 0.25 MG PO TABS
0.2500 mg | ORAL_TABLET | Freq: Once | ORAL | Status: AC
  Administered 2021-09-02: 0.25 mg via ORAL
  Filled 2021-09-02: qty 1

## 2021-09-02 MED ORDER — HYDROMORPHONE 1 MG/ML IV SOLN
INTRAVENOUS | Status: DC
  Administered 2021-09-02: 30 mg via INTRAVENOUS
  Administered 2021-09-02: 2 mg via INTRAVENOUS
  Administered 2021-09-02: 7.5 mg via INTRAVENOUS
  Administered 2021-09-02: 6.5 mg via INTRAVENOUS
  Administered 2021-09-03: 8 mg via INTRAVENOUS
  Administered 2021-09-03: 4 mg via INTRAVENOUS
  Administered 2021-09-03: 6 mg via INTRAVENOUS
  Filled 2021-09-02: qty 30

## 2021-09-02 MED ORDER — OXYCODONE HCL 5 MG PO TABS
5.0000 mg | ORAL_TABLET | ORAL | Status: DC | PRN
  Administered 2021-09-02 – 2021-09-03 (×5): 5 mg via ORAL
  Filled 2021-09-02 (×5): qty 1

## 2021-09-02 MED ORDER — SODIUM CHLORIDE 0.9 % IV SOLN
12.5000 mg | Freq: Once | INTRAVENOUS | Status: AC
  Administered 2021-09-02: 12.5 mg via INTRAVENOUS
  Filled 2021-09-02: qty 12.5
  Filled 2021-09-02: qty 0.25

## 2021-09-02 MED ORDER — SODIUM CHLORIDE 0.9 % IV SOLN
12.5000 mg | Freq: Once | INTRAVENOUS | Status: AC
  Administered 2021-09-02: 12.5 mg via INTRAVENOUS
  Filled 2021-09-02: qty 12.5

## 2021-09-02 MED ORDER — DIPHENHYDRAMINE HCL 25 MG PO CAPS: 25.0000 mg | ORAL_CAPSULE | Freq: Once | ORAL | Status: DC | PRN

## 2021-09-02 MED ORDER — OXYCODONE-ACETAMINOPHEN 5-325 MG PO TABS
1.0000 | ORAL_TABLET | ORAL | Status: DC | PRN
  Administered 2021-09-02 – 2021-09-03 (×5): 1 via ORAL
  Filled 2021-09-02 (×5): qty 1

## 2021-09-02 NOTE — Progress Notes (Signed)
Subjective: ?Martin [name] is a 31 year old male with a medical history significant for sickle cell disease, chronic pain syndrome, opiate dependence and tolerance, HIV disease, anemia of chronic disease, chronic hypoxia on home oxygen, and history of pulmonary embolus on Eliquis was admitted for sickle cell pain crisis. ?Today, patient says that pain intensity has somewhat improved overnight.  He rates his pain as 6/10.  He states that he is not ready for discharge at this time.  He denies any headache, chest pain, urinary symptoms, nausea, vomiting, or diarrhea.  No shortness of breath. ? ?Objective: ? ?Vital signs in last 24 hours: ? ?Vitals:  ? 09/02/21 1200 09/02/21 1602 09/02/21 1621 09/02/21 1622  ?BP:      ?Pulse:      ?Resp: 16 16    ?Temp:      ?TempSrc:      ?SpO2: (!) 88% 90%    ?Weight:    68 kg  ?Height:   6\' 3"  (1.905 m)   ? ? ?Intake/Output from previous day: ? ? ?Intake/Output Summary (Last 24 hours) at 09/02/2021 1825 ?Last data filed at 09/02/2021 1045 ?Gross per 24 hour  ?Intake 1145 ml  ?Output 2100 ml  ?Net -955 ml  ? ? ?Physical Exam: ?General: Alert, awake, oriented x3, in no acute distress.  ?HEENT: [place]/AT PEERL, EOMI ?Neck: Trachea midline,  no masses, no thyromegal,y no JVD, no carotid bruit ?OROPHARYNX:  Moist, No exudate/ erythema/lesions.  ?Heart: Regular rate and rhythm, without murmurs, rubs, gallops, PMI non-displaced, no heaves or thrills on palpation.  ?Lungs: Clear to auscultation, no wheezing or rhonchi noted. No increased vocal fremitus resonant to percussion  ?Abdomen: Soft, nontender, nondistended, positive bowel sounds, no masses no hepatosplenomegaly noted.09/04/2021  ?Neuro: No focal neurological deficits noted cranial nerves II through XII grossly intact. DTRs 2+ bilaterally upper and lower extremities. Strength 5 out of 5 in bilateral upper and lower extremities. ?Musculoskeletal: No warm swelling or erythema around joints, no spinal tenderness noted. ?Psychiatric: Patient alert  and oriented x3, good insight and cognition, good recent to remote recall. ?Lymph node survey: No cervical axillary or inguinal lymphadenopathy noted. ? ?Lab Results: ? ?Basic Metabolic Panel: ?   ?Component Value Date/Time  ? NA 136 09/01/2021 0606  ? NA 136 03/18/2021 1232  ? K 3.5 09/01/2021 0606  ? CL 105 09/01/2021 0606  ? CO2 23 09/01/2021 0606  ? BUN 12 09/01/2021 0606  ? BUN 11 03/18/2021 1232  ? CREATININE 0.93 09/01/2021 0606  ? GLUCOSE 98 09/01/2021 0606  ? CALCIUM 8.6 (L) 09/01/2021 0606  ? ?CBC: ?   ?Component Value Date/Time  ? WBC 10.3 09/02/2021 0908  ? HGB 8.8 (L) 09/02/2021 0908  ? HGB NOT PERFORMED 08/14/2021 0824  ? HCT 23.2 (L) 09/02/2021 0908  ? HCT NOT PERFORMED 08/14/2021 0824  ? PLT 273 09/02/2021 0908  ? PLT NOT PERFORMED 08/14/2021 0824  ? MCV 100.0 09/02/2021 0908  ? MCV 105 (H) 08/14/2021 0500  ? NEUTROABS 4.6 09/02/2021 0908  ? NEUTROABS 4.3 08/14/2021 0500  ? LYMPHSABS 4.1 (H) 09/02/2021 0908  ? LYMPHSABS NOT PERFORMED 08/14/2021 0824  ? MONOABS 1.1 (H) 09/02/2021 0908  ? EOSABS 0.4 09/02/2021 0908  ? EOSABS NOT PERFORMED 08/14/2021 0824  ? BASOSABS 0.0 09/02/2021 0908  ? BASOSABS NOT PERFORMED 08/14/2021 0824  ? ? ?No results found for this or any previous visit (from the past 240 hour(s)). ? ?Studies/Results: ?DG Chest 2 View ? ?Result Date: 09/01/2021 ?CLINICAL DATA:  31 year old male with  history of hypoxia. Sickle cell disease. EXAM: CHEST - 2 VIEW COMPARISON:  Chest x-ray 08/12/2021. FINDINGS: Right internal jugular single-lumen power porta cath with tip terminating in the mid superior vena cava. Lung volumes are normal. Coarse interstitial markings are again noted throughout the lungs bilaterally. Diffuse peribronchial cuffing, similar to prior studies. Ill-defined opacity in the right lower lobe concerning for developing airspace consolidation. No pleural effusions. No pneumothorax. No evidence of pulmonary edema. Heart size is upper limits of normal. Upper mediastinal contours  are within normal limits. IMPRESSION: 1. In additional to chronic imaging stigmata of sickle cell disease there is ill-defined airspace consolidation in the right lower lobe concerning for acute chest syndrome. Electronically Signed   By: Trudie Reed M.D.   On: 09/01/2021 07:04   ? ?Medications: ?Scheduled Meds: ? abacavir-dolutegravir-lamiVUDine  1 tablet Oral QHS  ? apixaban  5 mg Oral BID  ? Chlorhexidine Gluconate Cloth  6 each Topical Daily  ? HYDROmorphone   Intravenous Q4H  ? hydroxyurea  2,000 mg Oral QHS  ? senna-docusate  1 tablet Oral BID  ? ?Continuous Infusions: ?PRN Meds:.melatonin, naloxone **AND** sodium chloride flush, ondansetron, oxyCODONE-acetaminophen **AND** oxyCODONE, polyethylene glycol ? ?Consultants: ?None ? ?Procedures: ?None ? ?Antibiotics: ?None ? ?Assessment/Plan: ?Principal Problem: ?  Sickle cell pain crisis (HCC) ?Active Problems: ?  Human immunodeficiency virus (HIV) disease (HCC) ?  History of pulmonary embolus (PE) ?  Leukocytosis ?  Acute on chronic respiratory failure (HCC) ? ?Sickle cell disease with pain crisis: ?Weaning IV Dilaudid PCA ?Continue Percocet 10-325 mg every 4 hours as needed for severe breakthrough pain ?Decrease IV fluids to KVO ?Monitor vital signs very closely, reevaluate pain scale regularly, and supplemental oxygen as needed ? ?Chronic pain syndrome: ?Continue home medications ? ?Anemia of chronic disease: ?Hemoglobin is 8.8 g/dL, consistent with patient's baseline.  There is no clinical indication for blood transfusion at this time.  Continue to monitor closely. ? ?Leukocytosis: ?Resolved.  Continue to monitor closely.  Labs in AM. ? ?Hyperbilirubinemia: ?Bilirubin trending down.  Secondary to sickle cell crisis.  We will continue to monitor closely. ? ?Chronic hypoxia with respiratory failure: ?Continue 3 L supplemental oxygen to maintain oxygen saturation above 92% ? ?HIV disease: ?Continue antiviral medications.  Follow-up with ID as an  outpatient. ? ?History of pulmonary embolism: ?Continue apixaban ? ?Code Status: Full Code ?Family Communication: N/A ?Disposition Plan: Not yet ready for discharge ? ? ?[name]  APRN, [name], [name] ?Patient Care Center ?[facility] ?[address]  ?[place], [place] [address] ?[phone] ? ?If 7PM-7AM, please contact night-coverage. ? ?09/02/2021, 6:25 PM  LOS: 1 day   ?

## 2021-09-03 MED ORDER — HEPARIN SOD (PORK) LOCK FLUSH 100 UNIT/ML IV SOLN
500.0000 [IU] | Freq: Once | INTRAVENOUS | Status: AC
  Administered 2021-09-03: 500 [IU] via INTRAVENOUS
  Filled 2021-09-03: qty 5

## 2021-09-03 MED ORDER — FLEET ENEMA 7-19 GM/118ML RE ENEM: 1.0000 | ENEMA | Freq: Once | RECTAL | Status: DC

## 2021-09-03 NOTE — Plan of Care (Signed)
?  Problem: Education: ?Goal: Awareness of signs and symptoms of anemia will improve ?Outcome: Progressing ?  ?Problem: Education: ?Goal: Long-term complications will improve ?Outcome: Not Progressing ?  ?Problem: Bowel/Gastric: ?Goal: Gut motility will be maintained ?Outcome: Not Progressing ?Note: No BM since 5/13 ?  ?Problem: Respiratory: ?Goal: Pulmonary complications will be avoided or minimized ?Outcome: Not Progressing ?  ?Pt resting at this time.  Reporting pain 7/10.  Stated that Miralax is not working for constipation and requested enema. ?

## 2021-09-03 NOTE — Discharge Summary (Signed)
Physician Discharge Summary  Martin [name] [id number] DOB: 08-Sep-1990 DOA: 09/01/2021  PCP: [name], [name]  Admit date: 09/01/2021  Discharge date: 09/03/2021  Discharge Diagnoses:  Principal Problem:   Sickle cell pain crisis ([place]) Active Problems:   Human immunodeficiency virus (HIV) disease ([place])   History of pulmonary embolus (PE)   Leukocytosis   Chronic respiratory failure ([place])   Discharge Condition: Stable  Disposition:   Follow-up Information     [name], [name] Follow up.   Specialty: Family Medicine Contact information: [place]. [place] [address] [phone]                Pt is discharged home in good condition and is to follow up with [name], [name] this week to have labs evaluated. Martin [name] is instructed to increase activity slowly and balance with rest for the next few days, and use prescribed medication to complete treatment of pain  Diet: Regular Wt Readings from Last 3 Encounters:  09/02/21 68 kg  08/11/21 68 kg  08/03/21 68 kg    History of present illness:   Martin [name]  is a 31 y.o. male with a medical history significant for sickle cell disease, chronic pain syndrome, opiate dependence and tolerance, history of PE on Eliquis, chronic respiratory failure with hypoxia, history of HIV, and anemia of chronic disease presents to the emergency department with complaints of pain to chest, low back, and lower extremity pain. Pain has been escalating over the past several days and unrelieved by his home medications. He has not identified any aggravating or alleviating factors concerning crisis. He characterizes pain as generalized, constant, and aching. He says that pain is consistent with previous sickle cell pain crises. Patient denies headache, dizziness, or shortness of breath. No urinary symptoms, nausea, vomiting, or diarrhea. No recent travel of exposure as COVID-19.  The  patient's last hospitalization was 4/24 - 4/30, patient was treated for acute chest syndrome and atypical pneumonia.  He was discharged with Augmentin, patient states that he completed antibiotic.   ED Course:  Vital signs show: BP 109/75   Pulse 90   Temp 98.2 F (36.8 C) (Oral)   Resp 18   SpO2 (!) 89%    Complete blood count shows WBCs 12.1, hemoglobin 9.6, and platelets 312.  Complete metabolic panel shows bilirubin markedly elevated at 13.6, patient's bilirubin is chronically elevated.  CMP otherwise unremarkable.  Chest x-ray shows chronic imaging stigmata of sickle cell disease and there is an ill-defined airspace consolidation in the right lower lobe that is concerning for acute chest syndrome.  Patient has chronic hypoxia and is typically on 2-3 L supplemental oxygen at home.  Oxygen saturation is ranging between 88-92% on 3 L.  COVID-19 and influenza negative.  Pain persists despite IV Dilaudid and IV fluids.  Patient will be admitted to [facility] for further management of sickle cell pain crisis. Hospital Course:  Sickle cell disease with pain crisis:  Patient was admitted for sickle cell pain crisis and managed appropriately with IVF, IV Dilaudid via PCA and  as well as other adjunct therapies per sickle cell pain management protocols. Patient states that pain intensity is 3/10 and is requesting discharge home. He will follow-up with his PCP for medication management. Chronic hypoxia with respiratory failure: Patient will continue home oxygen at 3 L/min.  We will follow-up with pulmonology as an outpatient.  History of pulmonary embolism: Patient advised to continue apixaban.  Counseled on the importance of taking prescribed medications consistently in order to achieve positive outcomes. Patient will resume all other home medications.  Patient was therefore discharged home today in a hemodynamically stable condition.   Martin [name] will follow-up with PCP within 1 week of this  discharge. Martin [name] was counseled extensively about nonpharmacologic means of pain management, patient verbalized understanding and was appreciative of  the care received during this admission.   We discussed the need for good hydration, monitoring of hydration status, avoidance of heat, cold, stress, and infection triggers. We discussed the need to be adherent with taking Hydrea and other home medications. Patient was reminded of the need to seek medical attention immediately if any symptom of bleeding, anemia, or infection occurs.  Discharge Exam: Vitals:   09/03/21 1025 09/03/21 1154  BP: 107/66   Pulse: 85   Resp: 15 14  Temp: 98 F (36.7 C)   SpO2: 98% (!) 84%   Vitals:   09/03/21 0526 09/03/21 0823 09/03/21 1025 09/03/21 1154  BP: 100/69  107/66   Pulse: 87  85   Resp: $Remo'14 16 15 14  'WmGsm$ Temp: 98 F (36.7 C)  98 F (36.7 C)   TempSrc: Oral  Oral   SpO2: 92% (!) 87% 98% (!) 84%  Weight:      Height:        General appearance : Awake, alert, not in any distress. Speech Clear. Not toxic looking HEENT: Atraumatic and Normocephalic, pupils equally reactive to light and accomodation Neck: Supple, no JVD. No cervical lymphadenopathy.  Chest: Good air entry bilaterally, no added sounds  CVS: S1 S2 regular, no murmurs.  Abdomen: Bowel sounds present, Non tender and not distended with no gaurding, rigidity or rebound. Extremities: B/L Lower Ext shows no edema, both legs are warm to touch Neurology: Awake alert, and oriented X 3, CN II-XII intact, Non focal Skin: No Rash  Discharge Instructions  Discharge Instructions     Discharge patient   Complete by: As directed    Discharge disposition: 01-Home or Self Care   Discharge patient date: 09/03/2021      Allergies as of 09/03/2021       Reactions   Ketorolac Tromethamine Swelling, Other (See Comments)   Patient reports facial edema and left arm edema after administration.   Tape Rash, Other (See Comments)   PLEASE DO NOT USE  THE CLEAR, THICK, "PLASTIC" TAPE- only paper tape is tolerated    Wound Dressing Adhesive Rash        Medication List     TAKE these medications    ALPRAZolam 1 MG tablet Commonly known as: XANAX Take 1 mg by mouth 2 (two) times daily as needed for anxiety.   apixaban 5 MG Tabs tablet Commonly known as: ELIQUIS Take 5 mg by mouth 2 (two) times daily.   cetirizine 10 MG tablet Commonly known as: ZYRTEC Take 10 mg by mouth daily as needed for allergies.   hydroxyurea 500 MG capsule Commonly known as: HYDREA Take 4 capsules (2,000 mg total) by mouth at bedtime.   melatonin 3 MG Tabs tablet Take 6 mg by mouth at bedtime as needed (sleep).   naloxone 4 MG/0.1ML Liqd nasal spray kit Commonly known as: NARCAN Place 4 mg into the nose as needed for opioid reversal.   oxyCODONE-acetaminophen 10-325 MG tablet Commonly known as: Percocet Take 1 tablet by mouth every 4 (four) hours as needed for up to 15 days for pain.   Triumeq 600-50-300 MG tablet  Generic drug: abacavir-dolutegravir-lamiVUDine Take 1 tablet by mouth at bedtime.        The results of significant diagnostics from this hospitalization (including imaging, microbiology, ancillary and laboratory) are listed below for reference.    Significant Diagnostic Studies: DG Chest 2 View  Result Date: 09/01/2021 CLINICAL DATA:  31 year old male with history of hypoxia. Sickle cell disease. EXAM: CHEST - 2 VIEW COMPARISON:  Chest x-ray 08/12/2021. FINDINGS: Right internal jugular single-lumen power porta cath with tip terminating in the mid superior vena cava. Lung volumes are normal. Coarse interstitial markings are again noted throughout the lungs bilaterally. Diffuse peribronchial cuffing, similar to prior studies. Ill-defined opacity in the right lower lobe concerning for developing airspace consolidation. No pleural effusions. No pneumothorax. No evidence of pulmonary edema. Heart size is upper limits of normal. Upper  mediastinal contours are within normal limits. IMPRESSION: 1. In additional to chronic imaging stigmata of sickle cell disease there is ill-defined airspace consolidation in the right lower lobe concerning for acute chest syndrome. Electronically Signed   By: Vinnie Langton M.D.   On: 09/01/2021 07:04   DG Chest 2 View  Result Date: 08/12/2021 CLINICAL DATA:  Sickle cell crisis.  HIV EXAM: CHEST - 2 VIEW COMPARISON:  08/11/2021 FINDINGS: Heart size and vascularity normal. Increased lung markings are present on the right which have developed since yesterday. This may represent viral or atypical pneumonia. No pleural effusion. Left lung remains clear Port-A-Cath tip SVC. IMPRESSION: Interval development of increased interstitial markings in the right lung which may represent viral or atypical pneumonia. No effusion. Electronically Signed   By: Franchot Gallo M.D.   On: 08/12/2021 16:03   DG Chest Port 1 View  Result Date: 08/11/2021 CLINICAL DATA:  Chest pain.  Sickle cell disease. EXAM: PORTABLE CHEST 1 VIEW COMPARISON:  08/03/2021 FINDINGS: 0444 hours. Cardiopericardial silhouette is at upper limits of normal for size. Lungs are hyperexpanded. Interval improvement in the interstitial prominence and ill-defined opacity seen previously at the lung bases. No pneumothorax or pleural effusion. Right Port-A-Cath again noted. Telemetry leads overlie the chest. IMPRESSION: Interval improvement in interstitial prominence and ill-defined bibasilar airspace disease. Electronically Signed   By: Misty Stanley M.D.   On: 08/11/2021 05:12    Microbiology: No results found for this or any previous visit (from the past 240 hour(s)).   Labs: Basic Metabolic Panel: Recent Labs  Lab 09/01/21 0606  NA 136  K 3.5  CL 105  CO2 23  GLUCOSE 98  BUN 12  CREATININE 0.93  CALCIUM 8.6*   Liver Function Tests: Recent Labs  Lab 09/01/21 0606 09/02/21 0957  AST 25 20  ALT 11 11  ALKPHOS 65 56  BILITOT 13.6*  7.6*  PROT 8.9* 8.4*  ALBUMIN 4.0 3.8   No results for input(s): LIPASE, AMYLASE in the last 168 hours. No results for input(s): AMMONIA in the last 168 hours. CBC: Recent Labs  Lab 09/01/21 0606 09/02/21 0908  WBC 12.1* 10.3  NEUTROABS 4.1 4.6  HGB 9.6* 8.8*  HCT 26.0* 23.2*  MCV 100.8* 100.0  PLT 312 273   Cardiac Enzymes: No results for input(s): CKTOTAL, CKMB, CKMBINDEX, TROPONINI in the last 168 hours. BNP: Invalid input(s): POCBNP CBG: No results for input(s): GLUCAP in the last 168 hours.  Time coordinating discharge: 30 minutes  Signed:  Donia Pounds  APRN, [name], [name] [facility] [address] [place], [place] [address] [phone]  [facility] 09/03/2021, 1:21  PM  

## 2021-09-12 ENCOUNTER — Telehealth: Payer: Self-pay | Admitting: Family Medicine

## 2021-09-12 NOTE — Telephone Encounter (Signed)
Oxycodone refill request  Pharmacy change to [address] [place] [address]

## 2021-09-16 ENCOUNTER — Telehealth: Payer: Self-pay

## 2021-09-16 ENCOUNTER — Other Ambulatory Visit: Payer: Self-pay | Admitting: Family Medicine

## 2021-09-16 ENCOUNTER — Ambulatory Visit: Payer: Medicaid Other | Admitting: Family Medicine

## 2021-09-16 DIAGNOSIS — G8929 Other chronic pain: Secondary | ICD-10-CM

## 2021-09-16 DIAGNOSIS — D571 Sickle-cell disease without crisis: Secondary | ICD-10-CM

## 2021-09-16 MED ORDER — OXYCODONE-ACETAMINOPHEN 10-325 MG PO TABS: 1.0000 | ORAL_TABLET | ORAL | 0 refills | Status: DC | PRN

## 2021-09-16 NOTE — Telephone Encounter (Signed)
Pt called and asked if we can send his   OXYCODONE    To [address] [place]

## 2021-09-16 NOTE — Progress Notes (Signed)
Reviewed PDMP substance reporting system prior to prescribing opiate medications. No inconsistencies noted.   Meds ordered this encounter  Medications  . oxyCODONE-acetaminophen (PERCOCET) 10-325 MG tablet    Sig: Take 1 tablet by mouth every 4 (four) hours as needed for up to 15 days for pain.    Dispense:  90 tablet    Refill:  0    Order Specific Question:   Supervising Provider    Answer:   JEGEDE, OLUGBEMIGA E [[id number]]    Martin [name] Martin Stern  APRN, [name], [name] [facility] [address]  [place], [place] [address] [phone]  

## 2021-09-18 ENCOUNTER — Telehealth: Payer: Medicaid Other | Admitting: Family Medicine

## 2021-09-29 ENCOUNTER — Telehealth: Payer: Self-pay | Admitting: Family Medicine

## 2021-09-29 ENCOUNTER — Other Ambulatory Visit: Payer: Self-pay | Admitting: Family Medicine

## 2021-09-29 NOTE — Telephone Encounter (Signed)
OXYCODONE REFILL REQUEST  Send to [facility] on [address] [place], [place] [address]

## 2021-10-01 ENCOUNTER — Other Ambulatory Visit: Payer: Self-pay | Admitting: Family Medicine

## 2021-10-01 DIAGNOSIS — D571 Sickle-cell disease without crisis: Secondary | ICD-10-CM

## 2021-10-01 DIAGNOSIS — G8929 Other chronic pain: Secondary | ICD-10-CM

## 2021-10-01 MED ORDER — OXYCODONE-ACETAMINOPHEN 10-325 MG PO TABS: 1.0000 | ORAL_TABLET | ORAL | 0 refills | Status: DC | PRN

## 2021-10-01 NOTE — Progress Notes (Signed)
Reviewed PDMP substance reporting system prior to prescribing opiate medications. No inconsistencies noted.   Meds ordered this encounter  Medications  . oxyCODONE-acetaminophen (PERCOCET) 10-325 MG tablet    Sig: Take 1 tablet by mouth every 4 (four) hours as needed for up to 15 days for pain.    Dispense:  90 tablet    Refill:  0    Order Specific Question:   Supervising Provider    Answer:   JEGEDE, OLUGBEMIGA E [[id number]]    Martin Nay Moore Gatsby Chismar  APRN, [name], [name] [facility] [address]  [place], [place] [address] [phone]  

## 2021-10-14 ENCOUNTER — Telehealth: Payer: Self-pay

## 2021-10-15 ENCOUNTER — Other Ambulatory Visit: Payer: Self-pay | Admitting: Family Medicine

## 2021-10-15 DIAGNOSIS — D571 Sickle-cell disease without crisis: Secondary | ICD-10-CM

## 2021-10-15 DIAGNOSIS — G8929 Other chronic pain: Secondary | ICD-10-CM

## 2021-10-15 MED ORDER — OXYCODONE-ACETAMINOPHEN 10-325 MG PO TABS: 1.0000 | ORAL_TABLET | ORAL | 0 refills | Status: AC | PRN

## 2021-10-15 NOTE — Progress Notes (Signed)
Notify patient, discussed prescription refills at length.  Patient will be working in [place] over the next several months.  Recommended that he finds a local primary care provider to continue to prescribe his medications.  Patient expressed understanding.  We will send a prescription for his regular opiate medication regimen.  He will reestablish care upon returning to [facility].  Reviewed PDMP substance reporting system prior to prescribing opiate medications. No inconsistencies noted.  Meds ordered this encounter  Medications   oxyCODONE-acetaminophen (PERCOCET) 10-325 MG tablet    Sig: Take 1 tablet by mouth every 4 (four) hours as needed for up to 15 days for pain.    Dispense:  90 tablet    Refill:  0    Order Specific Question:   Supervising Provider    Answer:   Quentin Angst [[id number]]   [name]  APRN, [name], [name] [facility] [address] [place], [place] [address] [phone]

## 2021-12-01 ENCOUNTER — Other Ambulatory Visit (HOSPITAL_COMMUNITY): Payer: Self-pay

## 2021-12-01 ENCOUNTER — Telehealth: Payer: Self-pay

## 2021-12-01 NOTE — Telephone Encounter (Signed)
Eliquis Percocet

## 2021-12-02 ENCOUNTER — Encounter: Payer: Self-pay | Admitting: Family Medicine

## 2021-12-02 ENCOUNTER — Other Ambulatory Visit (HOSPITAL_COMMUNITY): Payer: Self-pay

## 2021-12-02 ENCOUNTER — Telehealth: Payer: Self-pay

## 2021-12-02 ENCOUNTER — Ambulatory Visit (INDEPENDENT_AMBULATORY_CARE_PROVIDER_SITE_OTHER): Payer: Medicaid Other | Admitting: Family Medicine

## 2021-12-02 VITALS — BP 117/74 | HR 91 | Temp 98.0°F | Ht 75.0 in | Wt 138.0 lb

## 2021-12-02 DIAGNOSIS — D571 Sickle-cell disease without crisis: Secondary | ICD-10-CM | POA: Diagnosis not present

## 2021-12-02 DIAGNOSIS — Z86711 Personal history of pulmonary embolism: Secondary | ICD-10-CM

## 2021-12-02 DIAGNOSIS — F1190 Opioid use, unspecified, uncomplicated: Secondary | ICD-10-CM

## 2021-12-02 DIAGNOSIS — E559 Vitamin D deficiency, unspecified: Secondary | ICD-10-CM | POA: Diagnosis not present

## 2021-12-02 MED ORDER — OXYCODONE-ACETAMINOPHEN 10-325 MG PO TABS
1.0000 | ORAL_TABLET | ORAL | 0 refills | Status: DC | PRN
Start: 2021-12-02 — End: 2021-12-02
  Filled 2021-12-02 (×2): qty 90, 15d supply, fill #0

## 2021-12-02 MED ORDER — OXYCODONE-ACETAMINOPHEN 10-325 MG PO TABS: 1.0000 | ORAL_TABLET | ORAL | 0 refills | Status: DC | PRN

## 2021-12-02 MED ORDER — APIXABAN 5 MG PO TABS
5.0000 mg | ORAL_TABLET | Freq: Two times a day (BID) | ORAL | 11 refills | Status: DC
  Filled 2021-12-02: qty 60, 30d supply, fill #0

## 2021-12-02 NOTE — Telephone Encounter (Signed)
Pt asked if his percocet can go to the [facility] in [place] long said they cancel it cause he's on alock program

## 2021-12-02 NOTE — Progress Notes (Signed)
[facility]    Subjective   Patient ID: Martin [name], male    DOB: 1991/03/03  Age: 31 y.o. MRN: [id number]  Chief Complaint  Patient presents with   Follow-up    Pt is here for SCD follow up visit. Pt is requesting refill ELIQUIS, percocet     Martin [name] is a 31 year old male with a medical history significant for sickle cell disease, chronic pain syndrome, opiate dependence and tolerance, history of PE on Eliquis, and history of HIV presents for follow-up of chronic conditions.  Patient states that he has been doing well and does not have any complaints on today.  Patient recently returned to [facility] after being in [place] over the past 3 months. Today, patient reports pain primarily to low back and lower extremities.  His pain intensity is 5/10.  He denies headache, chest pain, shortness of breath, urinary symptoms, nausea, vomiting, or diarrhea. Patient has not had a recent eye exam.  He is followed by infectious disease in [place].    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   Abnormal penile discharge, without blood 02/22/2020   Leukocytosis 01/02/2020   Anxiety 11/27/2019   Proteinuria 11/27/2019   Chronic respiratory failure (HCC) 11/16/2019   Chronic, continuous use of opioids 08/15/2019   Seasonal allergies 08/15/2019   Chest congestion    Sickle cell anemia with crisis (HCC) 08/04/2019   History of pulmonary embolus (PE) 08/04/2019   Single subsegmental pulmonary embolism without acute cor pulmonale (HCC) 02/10/2019   Acute chest syndrome due to sickle cell crisis (HCC)  02/10/2019   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) 03/09/2018   Bone pain 03/07/2018   Hip pain 03/07/2018   Acute bronchitis due to Streptococcus 02/21/2018   Heart murmur 02/03/2017   Sickle cell disease with crisis (HCC) 02/02/2017   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   Vitamin D deficiency 08/14/2016   Sickle cell pain crisis (HCC) 02/12/2016   High risk medication use 09/27/2014   Generalized anxiety disorder 05/19/2014   Gastro-esophageal reflux disease without esophagitis 05/19/2014   Marijuana use 11/07/2012   Human immunodeficiency virus (HIV) disease (HCC) 05/23/2012   Past Medical History:  Diagnosis Date   Anxiety    HIV (human immunodeficiency virus infection) (HCC)    Proteinuria    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HCC)    Vitamin D deficiency 10/2018   Past Surgical History:  Procedure Laterality Date   IR IMAGING GUIDED PORT INSERTION  08/29/2019   IR REMOVAL TUN ACCESS W/ PORT W/O FL MOD SED  08/30/2020   TEE WITHOUT CARDIOVERSION N/A 08/30/2020   Procedure: TRANSESOPHAGEAL ECHOCARDIOGRAM (TEE);  Surgeon: [name], MD;  Location: [facility] ENDOSCOPY;  Service: Cardiovascular;  Laterality: N/A;   Social History   Tobacco Use   Smoking status: Never   Smokeless tobacco: Never  Vaping Use   Vaping Use: Never used  Substance Use Topics   Alcohol use: No   Drug use: Yes    Types: Marijuana    Comment: 2x week   Social History  Socioeconomic History   Marital status: Single    Spouse name: Not on file   Number of children: Not on file   Years of education: Not on file   Highest education level: Not on file  Occupational History   Occupation: disabled  Tobacco Use   Smoking status: Never   Smokeless tobacco: Never  Vaping Use   Vaping Use: Never used  Substance and Sexual Activity   Alcohol use: No   Drug use: Yes    Types: Marijuana    Comment: 2x week   Sexual activity: Yes    Birth  control/protection: Condom  Other Topics Concern   Not on file  Social History Narrative   Not on file   Social Determinants of Health   Financial Resource Strain: Not on file  Food Insecurity: Not on file  Transportation Needs: Not on file  Physical Activity: Not on file  Stress: Not on file  Social Connections: Not on file  Intimate Partner Violence: Not on file   Family Status  Relation Name Status   Mother  Alive   Father  Alive   Sister  Alive   Brother  Alive   MGM  Deceased   MGF  Deceased   PGM  Deceased   PGF  Deceased   Brother  Alive   Brother  Alive   Brother  Alive   Brother  Alive   Brother  Alive   Brother  Alive   Family History  Problem Relation Age of Onset   Sickle cell trait Mother    Sickle cell trait Father    Birth defects Maternal Grandmother    Birth defects Paternal Grandmother    Allergies  Allergen Reactions   Ketorolac Tromethamine Swelling and Other (See Comments)    Patient reports facial edema and left arm edema after administration.   Tape Rash and Other (See Comments)    PLEASE DO NOT USE THE CLEAR, THICK, "PLASTIC" TAPE- only paper tape is tolerated    Wound Dressing Adhesive Rash      Review of Systems  Constitutional: Negative.   HENT: Negative.    Eyes: Negative.   Respiratory: Negative.    Cardiovascular: Negative.   Gastrointestinal: Negative.   Genitourinary: Negative.   Musculoskeletal:  Positive for back pain and joint pain.  Skin: Negative.   Neurological: Negative.   Endo/Heme/Allergies: Negative.   Psychiatric/Behavioral: Negative.        Objective:     BP 117/74 (BP Location: Left Arm, Patient Position: Sitting, Cuff Size: Normal)   Pulse 91   Temp 98 F (36.7 C)   Wt 138 lb (62.6 kg)   BMI 17.25 kg/m  BP Readings from Last 3 Encounters:  12/02/21 117/74  09/03/21 107/66  08/17/21 103/66   Wt Readings from Last 3 Encounters:  12/02/21 138 lb (62.6 kg)  09/02/21 150 lb (68 kg)  08/11/21  150 lb (68 kg)      Physical Exam Eyes:     Pupils: Pupils are equal, round, and reactive to light.  Cardiovascular:     Rate and Rhythm: Normal rate and regular rhythm.  Abdominal:     General: Bowel sounds are normal.  Musculoskeletal:        General: Normal range of motion.  Skin:    General: Skin is warm.  Neurological:     General: No focal deficit present.  Psychiatric:        Mood and Affect: Mood normal.  Behavior: Behavior normal.        Thought Content: Thought content normal.      No results found for any visits on 12/02/21.  Last CBC Lab Results  Component Value Date   WBC 10.3 09/02/2021   HGB 8.8 (L) 09/02/2021   HCT 23.2 (L) 09/02/2021   MCV 100.0 09/02/2021   MCH 37.9 (H) 09/02/2021   RDW 21.4 (H) 09/02/2021   PLT 273 09/02/2021   Last metabolic panel Lab Results  Component Value Date   GLUCOSE 98 09/01/2021   NA 136 09/01/2021   K 3.5 09/01/2021   CL 105 09/01/2021   CO2 23 09/01/2021   BUN 12 09/01/2021   CREATININE 0.93 09/01/2021   GFRNONAA >60 09/01/2021   CALCIUM 8.6 (L) 09/01/2021   PHOS 5.6 (H) 06/28/2018   PROT 8.4 (H) 09/02/2021   ALBUMIN 3.8 09/02/2021   LABGLOB 4.9 (H) 03/18/2021   AGRATIO 0.9 (L) 03/18/2021   BILITOT 7.6 (H) 09/02/2021   ALKPHOS 56 09/02/2021   AST 20 09/02/2021   ALT 11 09/02/2021   ANIONGAP 8 09/01/2021   Last lipids No results found for: "CHOL", "HDL", "LDLCALC", "LDLDIRECT", "[name]", "CHOLHDL" Last hemoglobin A1c Lab Results  Component Value Date   HGBA1C (L) 05/22/2009    <4.0 (NOTE) The ADA recommends the following therapeutic goal for glycemic control related to Hgb A1c measurement: Goal of therapy: <6.5 Hgb A1c  Reference: American Diabetes Association: Clinical Practice Recommendations 2010, Diabetes Care, 2010, 33: (Suppl  1).   Last thyroid functions No results found for: "TSH", "T3TOTAL", "T4TOTAL", "THYROIDAB" Last vitamin D Lab Results  Component Value Date   VD25OH 13.6 (L)  03/18/2021   Last vitamin B12 and Folate Lab Results  Component Value Date   VITAMINB12 238 02/02/2017   FOLATE 10.5 02/02/2017      The ASCVD Risk score (Arnett DK, et al., 2019) failed to calculate for the following reasons:   The 2019 ASCVD risk score is only valid for ages 70 to 4    Assessment & Plan:   Problem List Items Addressed This Visit       Other   History of pulmonary embolus (PE) (Chronic)   Relevant Medications   apixaban (ELIQUIS) 5 MG TABS tablet   Vitamin D deficiency   Relevant Orders   Sickle Cell Panel   Hb-SS disease without crisis (HCC)   Relevant Medications   Cyanocobalamin (B-12) 1000 MCG CAPS   oxyCODONE-acetaminophen (PERCOCET) 10-325 MG tablet   Other Relevant Orders   Sickle Cell Panel   Chronic, continuous use of opioids - Primary   Relevant Medications   oxyCODONE-acetaminophen (PERCOCET) 10-325 MG tablet   Other Relevant Orders   [id number] 11+Oxyco+Alc+Crt-Bund  1. Chronic, continuous use of opioids Reviewed PDMP substance reporting system prior to prescribing opiate medications. No inconsistencies noted.   - [id number] 11+Oxyco+Alc+Crt-Bund - oxyCODONE-acetaminophen (PERCOCET) 10-325 MG tablet; Take 1 tablet by mouth every 4 (four) hours as needed for pain.  Dispense: 90 tablet; Refill: 0  2. Hb-SS disease without crisis (HCC)  - Sickle Cell Panel - oxyCODONE-acetaminophen (PERCOCET) 10-325 MG tablet; Take 1 tablet by mouth every 4 (four) hours as needed for pain.  Dispense: 90 tablet; Refill: 0  3. History of pulmonary embolus (PE)  - apixaban (ELIQUIS) 5 MG TABS tablet; Take 1 tablet (5 mg total) by mouth 2 (two) times daily.  Dispense: 60 tablet; Refill: 11  4. Vitamin D deficiency  - Sickle Cell Panel   Return in about  3 months (around 03/04/2022) for sickle cell anemia.    [name]  APRN, [name], [name] Patient Care [facility] [address] [place], [place] [address] [phone]

## 2021-12-02 NOTE — Patient Instructions (Signed)
Living With Sickle Cell Disease Living with a long-term condition, such as sickle cell disease, can be a challenge. It can affect both your physical and mental health. You may not have total control over your condition. But proper care and treatment can help manage the effects of the disease so you can feel good and lead an active life. You can take steps to manage your condition and stay as healthy as possible. How does sickle cell disease affect me? Sickle cell disease can cause challenges that affect your quality of life. You may get sick more often as a result of organ damage and infections. Sometimes you may need to stay in the hospital. Learn how to recognize that you are not feeling well and that you may be getting sick. What actions can I take to manage my condition?  The goals of treatment are to control your symptoms and prevent and treat problems. Work with your health care provider to create a treatment plan that works for you. Taking an active role in managing your condition can help you feel more in control of your situation. Ask about possible side effects of medicines that your health care provider recommends. Discuss how you feel about having those side effects. Keeping a healthy lifestyle can help you manage your condition. This includes eating a healthy diet, getting enough sleep, and getting regular exercise. Sickle cell disease may affect your ability to take care of your basic needs. Tell your health care provider if you have concerns about any of these needs: Access to food. Housing. Safe drinking water and other utilities. Safety in your home and community. Work or school. Transportation. Paying for health care. Your health care provider may be able to connect you with community resources that can help you. How to manage stress  Living with sickle cell disease can be stressful. This disease can have a big impact on your mental health. Talk with your health care provider  about ways to reduce your stress or if you have concerns about your mental health.  To cope with stress, try: Keeping a stress diary. This can help you learn what causes your stress to start (figure out your triggers) and how to control your response to those triggers. Spending time doing things that you enjoy, such as: Hobbies. Being outdoors. Spending time with friends and people who make you laugh. Doing yoga, muscle relaxation, deep breathing, or mindfulness practices. Expressing yourself through journal writing, art, crafting, poetry, or playing music. Staying positive about your health. Try to accept that you cannot control your condition perfectly. Follow these instructions at home: Medicines Take over-the-counter and prescription medicines only as told by your health care provider. If you were prescribed antibiotics, take them as told by your health care provider. Do not stop taking them even if you start to feel better. If you develop a fever, do not take medicines to reduce the fever right away. This could cover up another problem. Contact your health care provider. Eating and drinking Drink enough fluid to keep your urine pale yellow. Drink more in hot weather and during exercise. Limit or avoid drinking alcohol. Eat a balanced and nutritious diet. Eat plenty of fruits, vegetables, whole grains, and lean protein. Take vitamins and supplements as told by your health care provider. Traveling When traveling, keep these with you: Your medical information. The names of your health care providers. Your medicines. If you have to travel by air, ask about precautions you should take. Managing pain Work with   your health care provider to create a pain management plan that works for you. The plan may include: Ways to reduce or manage your pain at home, such as: Using a heating pad. Taking a warm bath. Using healthy ways to distract you from the pain, such as hobbies or  reading. Practicing ways to relax, such as doing yoga or listening to music. Getting massages. Doing exercises or stretches as told by a physical therapist. Tracking how pain affects your daily life functions. When to seek help. Who to contact and what to do in case of a pain emergency. General instructions Do not use any products that contain nicotine or tobacco. These products include cigarettes, chewing tobacco, and vaping devices, such as e-cigarettes. These lower blood oxygen levels. If you need help quitting, ask your health care provider. Consider wearing a medical alert bracelet. Use an app or journal to track your symptoms, assess your level of pain and fatigue, and keep track of your medicines. Avoid the following: High altitudes. Very high or low temperatures and big changes in temperature. Activities that will lower your oxygen levels, such as mountain climbing or doing exercise that takes a lot of effort. Stay up to date on: Your treatment plan. Learn as much as you can about your condition. Health screenings. This will help prevent problems or catch them early on. Vaccines. This will help prevent infection. Wash your hands often with soap and water to help prevent infections. Wash them for at least 20 seconds each time. Keep all follow-up visits. Regular follow-up with your health care provider can help you better manage your condition. Where to find support You can find help and support through: Talking with a therapist or taking part in support groups. Sickle Cell Disease Foundation of America: www.sicklecelldisease.org Where to find more information Centers for Disease Control and Prevention: www.cdc.gov American Society of Hematology: www.hematology.org Contact a health care provider if: Your symptoms get worse. You have new symptoms. You have a fever. Get help right away if: You have a painful erection of the penis that lasts a long time (priapism). You become  short of breath or are having trouble breathing. You have pain that cannot be controlled with medicine. You have any signs of a stroke. "BE FAST" is an easy way to remember the main warning signs: B - Balance. Dizziness, sudden trouble walking, or loss of balance. E - Eyes. Trouble seeing or a change in how you see. F - Face. Sudden weakness or loss of feeling of the face. The face or eyelid may droop on one side. A - Arms. Weakness or loss of feeling in an arm. This happens all of a sudden and most often on one side of the body. S - Speech. Sudden trouble speaking, slurred speech, or trouble understanding what people say. T - Time. Time to call emergency services. Write down what time symptoms started. You have other signs of a stroke, such as: A sudden, very bad headache with no known cause. Feeling like you may vomit (nausea). Vomiting. Seizure. These symptoms may be an emergency. Get help right away. Call 911. Do not wait to see if the symptoms will go away. Do not drive yourself to the hospital. Also, get help right away if: You have strong feelings of sadness or loss of hope, or you have thoughts about hurting yourself or others. Take one of these steps if you feel like you may hurt yourself or others, or have thoughts about taking your own life:   Go to your nearest emergency room. Call 911. Call the National Suicide Prevention Lifeline at 1-800-273-8255 or 988. This is open 24 hours a day. Text the Crisis Text Line at 741741. Summary Proper care and treatment can help manage the effects of sickle cell disease so you can feel good and lead an active life. The goals of treatment are to control your symptoms and prevent and treat problems. Taking an active role in managing your condition can help you feel more in control of your situation. Work with your health care provider to create a pain management plan that works for you. Get medical help right away as told by your health care  provider. This information is not intended to replace advice given to you by your health care provider. Make sure you discuss any questions you have with your health care provider. Document Revised: 07/14/2021 Document Reviewed: 07/14/2021 Elsevier Patient Education  2023 Elsevier Inc.  

## 2021-12-03 ENCOUNTER — Other Ambulatory Visit: Payer: Self-pay | Admitting: Family Medicine

## 2021-12-03 ENCOUNTER — Telehealth: Payer: Self-pay

## 2021-12-03 DIAGNOSIS — F1190 Opioid use, unspecified, uncomplicated: Secondary | ICD-10-CM

## 2021-12-03 DIAGNOSIS — D571 Sickle-cell disease without crisis: Secondary | ICD-10-CM

## 2021-12-03 DIAGNOSIS — E559 Vitamin D deficiency, unspecified: Secondary | ICD-10-CM

## 2021-12-03 LAB — CMP14+CBC/D/PLT+FER+RETIC+V...
ALT: 17 IU/L (ref 0–44)
AST: 68 IU/L — ABNORMAL HIGH (ref 0–40)
Albumin/Globulin Ratio: 0.8 — ABNORMAL LOW (ref 1.2–2.2)
Albumin: 4.4 g/dL (ref 4.1–5.1)
Alkaline Phosphatase: 82 IU/L (ref 44–121)
BUN/Creatinine Ratio: 6 — ABNORMAL LOW (ref 9–20)
BUN: 5 mg/dL — ABNORMAL LOW (ref 6–20)
Basophils Absolute: 0 10*3/uL (ref 0.0–0.2)
Basos: 0 %
Bilirubin Total: 8.5 mg/dL — ABNORMAL HIGH (ref 0.0–1.2)
CO2: 10 mmol/L — ABNORMAL LOW (ref 20–29)
Calcium: 9.2 mg/dL (ref 8.7–10.2)
Chloride: 115 mmol/L — ABNORMAL HIGH (ref 96–106)
Creatinine, Ser: 0.84 mg/dL (ref 0.76–1.27)
EOS (ABSOLUTE): 0.2 10*3/uL (ref 0.0–0.4)
Eos: 2 %
Ferritin: 1728 ng/mL — ABNORMAL HIGH (ref 30–400)
Globulin, Total: 5.6 g/dL — ABNORMAL HIGH (ref 1.5–4.5)
Glucose: 83 mg/dL (ref 70–99)
Hematocrit: 27.4 % — ABNORMAL LOW (ref 37.5–51.0)
Hemoglobin: 9.7 g/dL — ABNORMAL LOW (ref 13.0–17.7)
Immature Grans (Abs): 0 10*3/uL (ref 0.0–0.1)
Immature Granulocytes: 0 %
Lymphocytes Absolute: 6.2 10*3/uL — ABNORMAL HIGH (ref 0.7–3.1)
Lymphs: 62 %
MCH: 39.3 pg — ABNORMAL HIGH (ref 26.6–33.0)
MCHC: 35.4 g/dL (ref 31.5–35.7)
MCV: 111 fL — ABNORMAL HIGH (ref 79–97)
Monocytes Absolute: 0.8 10*3/uL (ref 0.1–0.9)
Monocytes: 8 %
NRBC: 20 % — ABNORMAL HIGH (ref 0–0)
Neutrophils Absolute: 2.8 10*3/uL (ref 1.4–7.0)
Neutrophils: 28 %
Platelets: 307 10*3/uL (ref 150–450)
Potassium: 5.7 mmol/L — ABNORMAL HIGH (ref 3.5–5.2)
RBC: 2.47 x10E6/uL — CL (ref 4.14–5.80)
RDW: 22.9 % — ABNORMAL HIGH (ref 11.6–15.4)
Retic Ct Pct: 13.5 % — ABNORMAL HIGH (ref 0.6–2.6)
Sodium: 148 mmol/L — ABNORMAL HIGH (ref 134–144)
Total Protein: 10 g/dL — ABNORMAL HIGH (ref 6.0–8.5)
Vit D, 25-Hydroxy: 21.3 ng/mL — ABNORMAL LOW (ref 30.0–100.0)
WBC: 10.1 10*3/uL (ref 3.4–10.8)
eGFR: 120 mL/min/{1.73_m2} (ref >59)

## 2021-12-03 MED ORDER — OXYCODONE-ACETAMINOPHEN 10-325 MG PO TABS: 1.0000 | ORAL_TABLET | ORAL | 0 refills | Status: DC | PRN

## 2021-12-03 MED ORDER — ERGOCALCIFEROL 1.25 MG (50000 UT) PO CAPS: 50000.0000 [IU] | ORAL_CAPSULE | ORAL | 3 refills | Status: DC

## 2021-12-03 NOTE — Progress Notes (Signed)
Reviewed PDMP substance reporting system prior to prescribing opiate medications. No inconsistencies noted.  Meds ordered this encounter  Medications   oxyCODONE-acetaminophen (PERCOCET) 10-325 MG tablet    Sig: Take 1 tablet by mouth every 4 (four) hours as needed for pain.    Dispense:  90 tablet    Refill:  0    Order Specific Question:   Supervising Provider    Answer:   JEGEDE, OLUGBEMIGA E [[id number]]   Martin Vandevoort Moore Buryl Bamber  APRN, [name], [name] [facility] [address]  [place], [place] [address] [phone]  

## 2021-12-03 NOTE — Telephone Encounter (Signed)
Percocet  To be sent to [facility]   [address] [place]

## 2021-12-03 NOTE — Progress Notes (Signed)
Meds ordered this encounter  Medications   ergocalciferol (VITAMIN D2) 1.25 MG (50000 UT) capsule    Sig: Take 1 capsule (50,000 Units total) by mouth once a week.    Dispense:  12 capsule    Refill:  3    Order Specific Question:   Supervising Provider    Answer:   Quentin Angst [id number]

## 2021-12-03 NOTE — Progress Notes (Signed)
Reviewed all laboratory values.  Vitamin D is decreased at 21.3.  We will continue weekly vitamin D treatments with Drisdol 50,000 IUs.  The medication will be sent to patient's pharmacy.  Continue to follow-up every 3 months for sickle cell disease and medication management.

## 2021-12-04 LAB — DRUG SCREEN 764883 11+OXYCO+ALC+CRT-BUND
Amphetamines, Urine: NEGATIVE ng/mL
BENZODIAZ UR QL: NEGATIVE ng/mL
Barbiturate: NEGATIVE ng/mL
Cannabinoid Quant, Ur: NEGATIVE ng/mL
Cocaine (Metabolite): NEGATIVE ng/mL
Creatinine: 95.1 mg/dL (ref 20.0–300.0)
Ethanol: NEGATIVE %
Meperidine: NEGATIVE ng/mL
Methadone Screen, Urine: NEGATIVE ng/mL
OPIATE SCREEN URINE: NEGATIVE ng/mL
Oxycodone/Oxymorphone, Urine: NEGATIVE ng/mL
Phencyclidine: NEGATIVE ng/mL
Propoxyphene: NEGATIVE ng/mL
Tramadol: NEGATIVE ng/mL
pH, Urine: 5.4 (ref 4.5–8.9)

## 2021-12-17 ENCOUNTER — Telehealth: Payer: Self-pay | Admitting: Family Medicine

## 2021-12-17 NOTE — Telephone Encounter (Signed)
PERCOCET REFILL REQUEST  [facility] [address] IN [place]

## 2021-12-18 ENCOUNTER — Other Ambulatory Visit: Payer: Self-pay | Admitting: Family Medicine

## 2021-12-18 DIAGNOSIS — D571 Sickle-cell disease without crisis: Secondary | ICD-10-CM

## 2021-12-18 DIAGNOSIS — F1190 Opioid use, unspecified, uncomplicated: Secondary | ICD-10-CM

## 2021-12-18 MED ORDER — OXYCODONE-ACETAMINOPHEN 10-325 MG PO TABS: 1.0000 | ORAL_TABLET | ORAL | 0 refills | Status: DC | PRN

## 2021-12-18 NOTE — Progress Notes (Signed)
Reviewed PDMP substance reporting system prior to prescribing opiate medications. No inconsistencies noted.  Meds ordered this encounter  Medications   oxyCODONE-acetaminophen (PERCOCET) 10-325 MG tablet    Sig: Take 1 tablet by mouth every 4 (four) hours as needed for pain.    Dispense:  90 tablet    Refill:  0    Order Specific Question:   Supervising Provider    Answer:   JEGEDE, OLUGBEMIGA E [[id number]]   Kashina Mecum Moore Yamilex Borgwardt  APRN, [name], [name] [facility] [address]  [place], [place] [address] [phone]  

## 2022-01-02 ENCOUNTER — Telehealth: Payer: Self-pay | Admitting: Family Medicine

## 2022-01-02 NOTE — Telephone Encounter (Signed)
Percocet refill request 

## 2022-01-04 ENCOUNTER — Other Ambulatory Visit: Payer: Self-pay | Admitting: Family Medicine

## 2022-01-04 DIAGNOSIS — D571 Sickle-cell disease without crisis: Secondary | ICD-10-CM

## 2022-01-04 DIAGNOSIS — F1190 Opioid use, unspecified, uncomplicated: Secondary | ICD-10-CM

## 2022-01-04 MED ORDER — OXYCODONE-ACETAMINOPHEN 10-325 MG PO TABS: 1.0000 | ORAL_TABLET | ORAL | 0 refills | Status: DC | PRN

## 2022-01-04 NOTE — Progress Notes (Signed)
Reviewed PDMP substance reporting system prior to prescribing opiate medications. No inconsistencies noted.  Meds ordered this encounter  Medications   oxyCODONE-acetaminophen (PERCOCET) 10-325 MG tablet    Sig: Take 1 tablet by mouth every 4 (four) hours as needed for pain.    Dispense:  90 tablet    Refill:  0    Order Specific Question:   Supervising Provider    Answer:   Tresa Garter [[id number]]   Donia Pounds  APRN, [name], [name] Patient [place] [address] [place], [place] [address] [phone]

## 2022-01-07 IMAGING — DX DG CHEST 2V
2 series · 2 of 2 positions shown · non-contrast
Comparison: August 08, 2019

CLINICAL DATA: Sickle cell pain crisis.

EXAM:
CHEST - 2 VIEW

[chest pa]
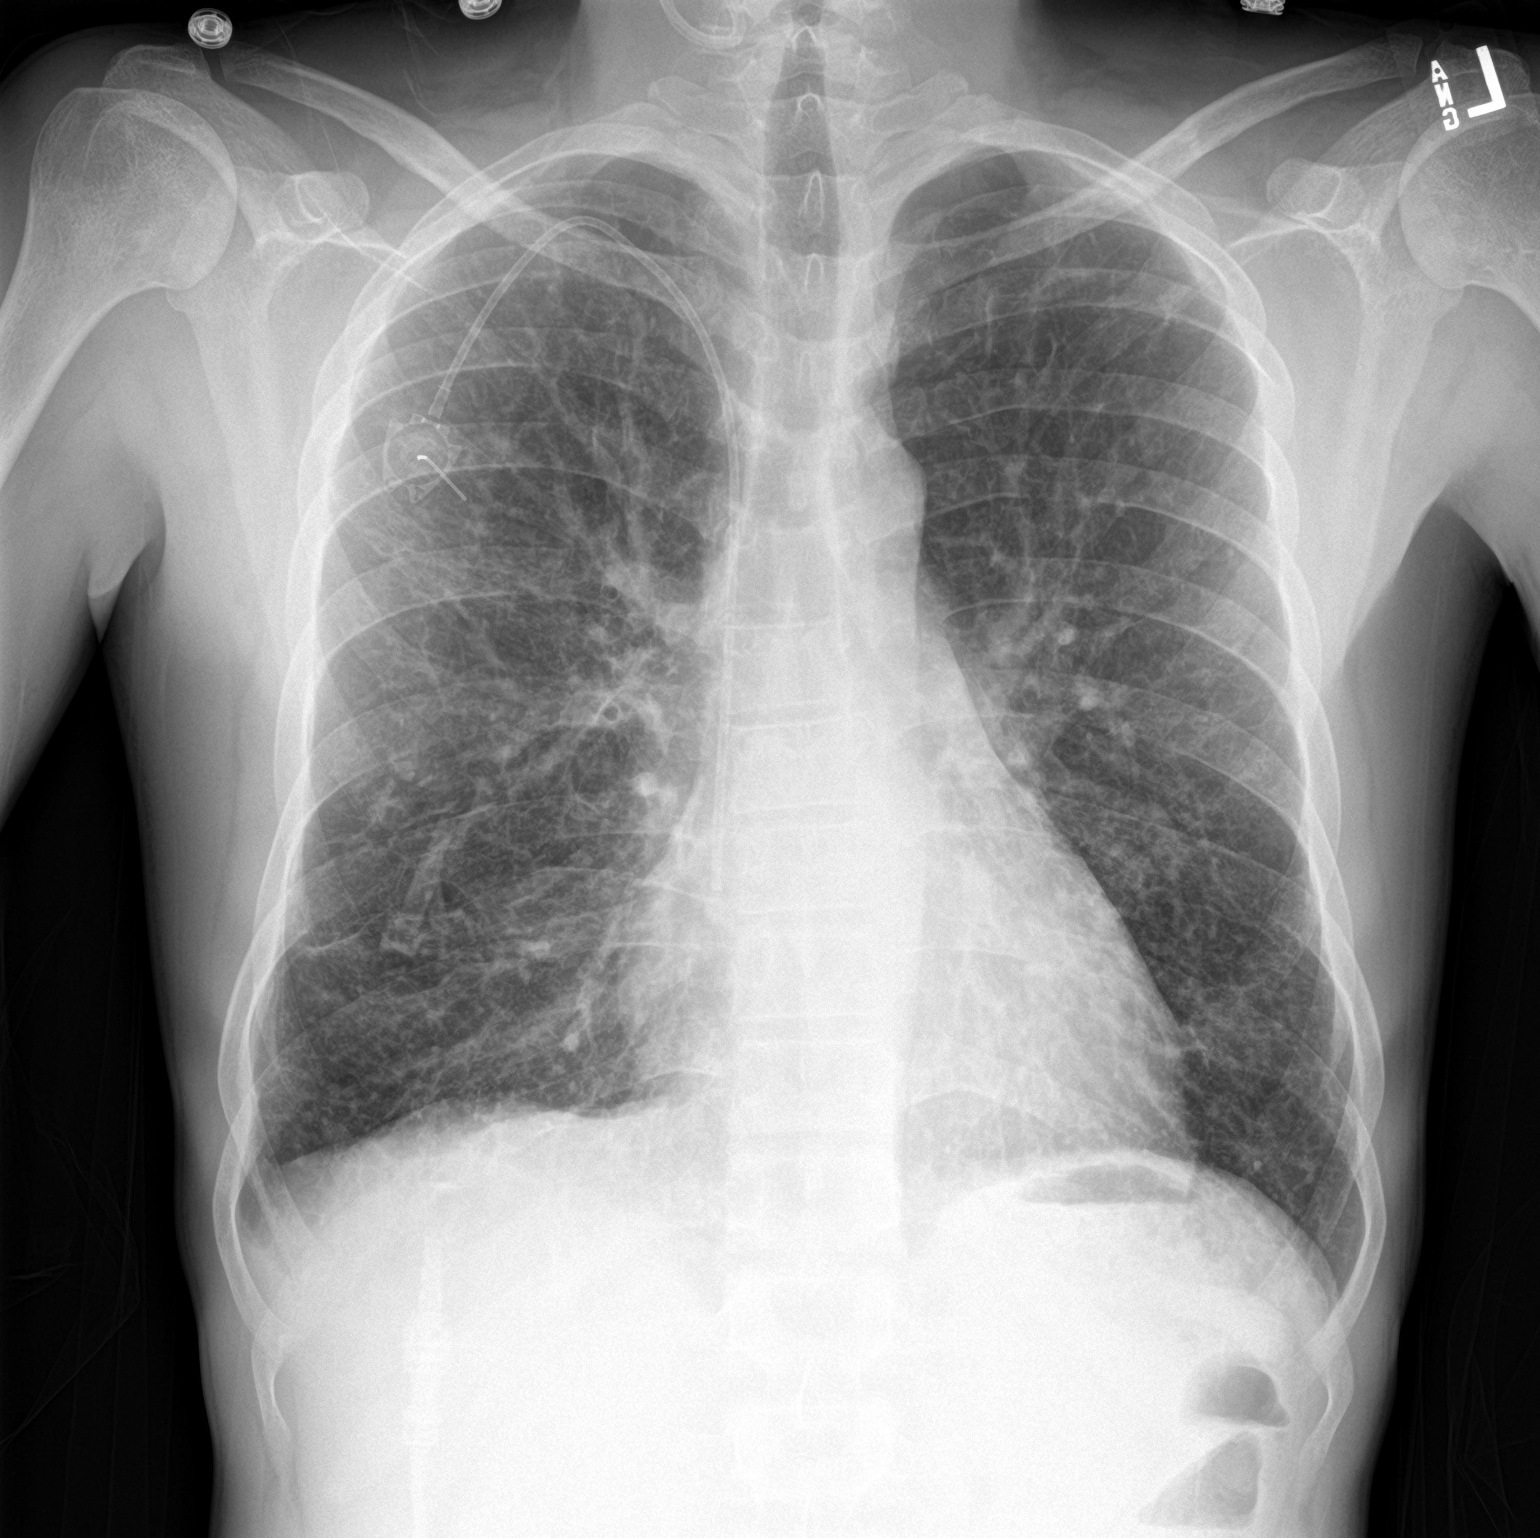

[chest lat]
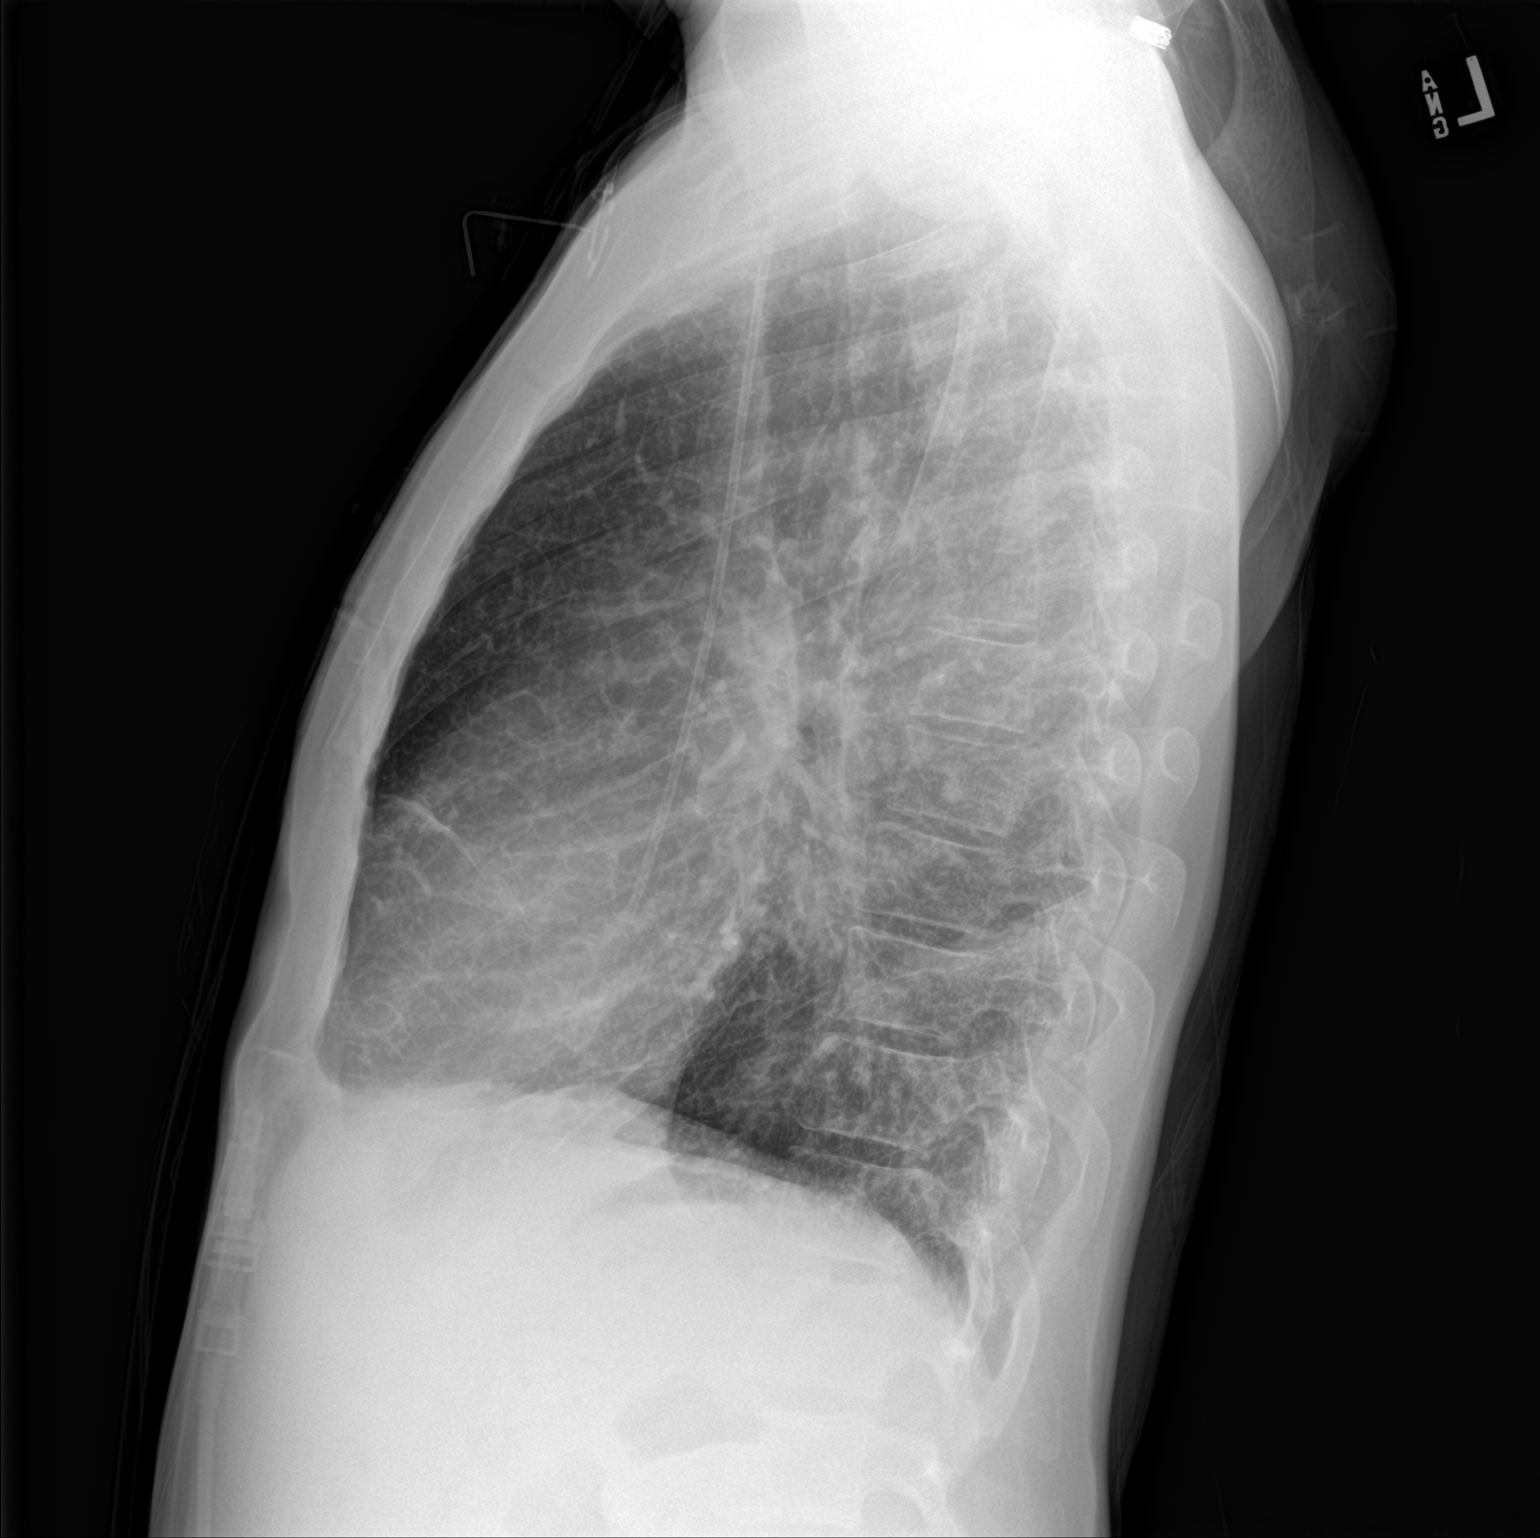

[2 of 2 positions shown; findings below may reference images not displayed]

FINDINGS: Since the prior study there is been interval placement of a
right-sided venous Port-A-Cath. Its distal tip is noted just beyond
the junction of the superior vena cava and right atrium. The lungs
are mildly hyperinflated. Mild to moderate severity diffuse chronic
appearing increased interstitial lung markings are noted. There is
no evidence of acute infiltrate, pleural effusion or pneumothorax.
The heart size and mediastinal contours are within normal limits.
The visualized skeletal structures are unremarkable.
IMPRESSION: Chronic appearing increased interstitial lung markings without
evidence of acute or active cardiopulmonary disease.

## 2022-01-27 ENCOUNTER — Telehealth: Payer: Self-pay

## 2022-01-27 ENCOUNTER — Other Ambulatory Visit: Payer: Self-pay | Admitting: Family Medicine

## 2022-01-27 ENCOUNTER — Other Ambulatory Visit (HOSPITAL_COMMUNITY): Payer: Self-pay

## 2022-01-27 DIAGNOSIS — D571 Sickle-cell disease without crisis: Secondary | ICD-10-CM

## 2022-01-27 DIAGNOSIS — F1190 Opioid use, unspecified, uncomplicated: Secondary | ICD-10-CM

## 2022-01-27 MED ORDER — OXYCODONE-ACETAMINOPHEN 10-325 MG PO TABS
1.0000 | ORAL_TABLET | ORAL | 0 refills | Status: DC | PRN
  Filled 2022-01-27: qty 90, 15d supply, fill #0

## 2022-01-27 NOTE — Telephone Encounter (Signed)
Percocet

## 2022-01-27 NOTE — Progress Notes (Signed)
Reviewed PDMP substance reporting system prior to prescribing opiate medications. No inconsistencies noted.  Meds ordered this encounter  Medications   oxyCODONE-acetaminophen (PERCOCET) 10-325 MG tablet    Sig: Take 1 tablet by mouth every 4 (four) hours as needed for pain.    Dispense:  90 tablet    Refill:  0    Order Specific Question:   Supervising Provider    Answer:   JEGEDE, OLUGBEMIGA E [[id number]]   Martin [place] Moore Yuri Flener  APRN, [name], [name] [facility] [address]  [place], [place] [address] [phone]  

## 2022-01-29 ENCOUNTER — Telehealth: Payer: Self-pay

## 2022-01-29 ENCOUNTER — Other Ambulatory Visit (HOSPITAL_COMMUNITY): Payer: Self-pay

## 2022-01-29 NOTE — Telephone Encounter (Signed)
Pt [facility] and said that [facility]    So [facility] [address]  [place]

## 2022-01-30 ENCOUNTER — Other Ambulatory Visit: Payer: Self-pay | Admitting: Family Medicine

## 2022-01-30 DIAGNOSIS — F1190 Opioid use, unspecified, uncomplicated: Secondary | ICD-10-CM

## 2022-01-30 DIAGNOSIS — D571 Sickle-cell disease without crisis: Secondary | ICD-10-CM

## 2022-01-30 MED ORDER — OXYCODONE-ACETAMINOPHEN 10-325 MG PO TABS: 1.0000 | ORAL_TABLET | ORAL | 0 refills | Status: DC | PRN

## 2022-01-30 NOTE — Progress Notes (Signed)
Reviewed PDMP substance reporting system prior to prescribing opiate medications. No inconsistencies noted.  Meds ordered this encounter  Medications   oxyCODONE-acetaminophen (PERCOCET) 10-325 MG tablet    Sig: Take 1 tablet by mouth every 4 (four) hours as needed for pain.    Dispense:  90 tablet    Refill:  0    Order Specific Question:   Supervising Provider    Answer:   JEGEDE, OLUGBEMIGA E [[id number]]   Karrissa Parchment Moore Dwight Adamczak  APRN, [name], [name] [facility] [address]  [place], [place] [address] [phone]  

## 2022-02-13 ENCOUNTER — Telehealth: Payer: Self-pay

## 2022-02-13 ENCOUNTER — Other Ambulatory Visit: Payer: Self-pay | Admitting: Family Medicine

## 2022-02-13 DIAGNOSIS — D571 Sickle-cell disease without crisis: Secondary | ICD-10-CM

## 2022-02-13 DIAGNOSIS — F1190 Opioid use, unspecified, uncomplicated: Secondary | ICD-10-CM

## 2022-02-13 MED ORDER — OXYCODONE-ACETAMINOPHEN 10-325 MG PO TABS: 1.0000 | ORAL_TABLET | ORAL | 0 refills | Status: DC | PRN

## 2022-02-13 NOTE — Telephone Encounter (Signed)
Oxycodone    [place]

## 2022-02-13 NOTE — Progress Notes (Signed)
Reviewed PDMP substance reporting system prior to prescribing opiate medications. No inconsistencies noted.  Meds ordered this encounter  Medications   oxyCODONE-acetaminophen (PERCOCET) 10-325 MG tablet    Sig: Take 1 tablet by mouth every 4 (four) hours as needed for pain.    Dispense:  90 tablet    Refill:  0    Order Specific Question:   Supervising Provider    Answer:   JEGEDE, OLUGBEMIGA E [[id number]]   Shadi Sessler Moore Ashaad Gaertner  APRN, [name], [name] [facility] [address]  [place], [place] [address] [phone]  

## 2022-02-19 ENCOUNTER — Telehealth (HOSPITAL_COMMUNITY): Payer: Self-pay

## 2022-02-19 ENCOUNTER — Other Ambulatory Visit: Payer: Self-pay

## 2022-02-19 ENCOUNTER — Inpatient Hospital Stay (HOSPITAL_COMMUNITY)
Admission: AD | Admit: 2022-02-19 | Discharge: 2022-02-20 | DRG: 812 | Disposition: A | Discharge status: Home / Self Care | Payer: Medicaid Other | Source: Ambulatory Visit | Attending: Internal Medicine | Admitting: Internal Medicine

## 2022-02-19 DIAGNOSIS — E559 Vitamin D deficiency, unspecified: Secondary | ICD-10-CM | POA: Diagnosis present

## 2022-02-19 DIAGNOSIS — Z79899 Other long term (current) drug therapy: Secondary | ICD-10-CM

## 2022-02-19 DIAGNOSIS — F419 Anxiety disorder, unspecified: Secondary | ICD-10-CM | POA: Diagnosis present

## 2022-02-19 DIAGNOSIS — B20 Human immunodeficiency virus [HIV] disease: Secondary | ICD-10-CM | POA: Diagnosis present

## 2022-02-19 DIAGNOSIS — J9611 Chronic respiratory failure with hypoxia: Secondary | ICD-10-CM | POA: Diagnosis present

## 2022-02-19 DIAGNOSIS — F1120 Opioid dependence, uncomplicated: Secondary | ICD-10-CM | POA: Diagnosis present

## 2022-02-19 DIAGNOSIS — Z832 Family history of diseases of the blood and blood-forming organs and certain disorders involving the immune mechanism: Secondary | ICD-10-CM

## 2022-02-19 DIAGNOSIS — Z888 Allergy status to other drugs, medicaments and biological substances status: Secondary | ICD-10-CM | POA: Diagnosis not present

## 2022-02-19 DIAGNOSIS — G894 Chronic pain syndrome: Secondary | ICD-10-CM | POA: Diagnosis present

## 2022-02-19 DIAGNOSIS — D5700 Hb-SS disease with crisis, unspecified: Secondary | ICD-10-CM | POA: Diagnosis present

## 2022-02-19 DIAGNOSIS — Z9981 Dependence on supplemental oxygen: Secondary | ICD-10-CM

## 2022-02-19 DIAGNOSIS — Z79890 Hormone replacement therapy (postmenopausal): Secondary | ICD-10-CM | POA: Diagnosis not present

## 2022-02-19 DIAGNOSIS — J4520 Mild intermittent asthma, uncomplicated: Secondary | ICD-10-CM | POA: Diagnosis present

## 2022-02-19 LAB — CBC WITH DIFFERENTIAL/PLATELET
Abs Immature Granulocytes: 0.04 10*3/uL (ref 0.00–0.07)
Basophils Absolute: 0 10*3/uL (ref 0.0–0.1)
Basophils Relative: 0 %
Eosinophils Absolute: 0.3 10*3/uL (ref 0.0–0.5)
Eosinophils Relative: 2 %
HCT: 26 % — ABNORMAL LOW (ref 39.0–52.0)
Hemoglobin: 9.5 g/dL — ABNORMAL LOW (ref 13.0–17.0)
Immature Granulocytes: 0 %
Lymphocytes Relative: 46 %
Lymphs Abs: 5.2 10*3/uL — ABNORMAL HIGH (ref 0.7–4.0)
MCH: 35.6 pg — ABNORMAL HIGH (ref 26.0–34.0)
MCHC: 36.5 g/dL — ABNORMAL HIGH (ref 30.0–36.0)
MCV: 97.4 fL (ref 80.0–100.0)
Monocytes Absolute: 1 10*3/uL (ref 0.1–1.0)
Monocytes Relative: 9 %
Neutro Abs: 4.9 10*3/uL (ref 1.7–7.7)
Neutrophils Relative %: 43 %
Platelets: 445 10*3/uL — ABNORMAL HIGH (ref 150–400)
RBC: 2.67 MIL/uL — ABNORMAL LOW (ref 4.22–5.81)
RDW: 24 % — ABNORMAL HIGH (ref 11.5–15.5)
WBC: 11.4 10*3/uL — ABNORMAL HIGH (ref 4.0–10.5)
nRBC: 1 % — ABNORMAL HIGH (ref 0.0–0.2)

## 2022-02-19 LAB — COMPREHENSIVE METABOLIC PANEL
ALT: 13 U/L (ref 0–44)
AST: 23 U/L (ref 15–41)
Albumin: 3.9 g/dL (ref 3.5–5.0)
Alkaline Phosphatase: 62 U/L (ref 38–126)
Anion gap: 5 (ref 5–15)
BUN: 10 mg/dL (ref 6–20)
CO2: 24 mmol/L (ref 22–32)
Calcium: 8.9 mg/dL (ref 8.9–10.3)
Chloride: 111 mmol/L (ref 98–111)
Creatinine, Ser: 0.88 mg/dL (ref 0.61–1.24)
GFR, Estimated: >60 mL/min (ref >60)
Glucose, Bld: 84 mg/dL (ref 70–99)
Potassium: 4.2 mmol/L (ref 3.5–5.1)
Sodium: 140 mmol/L (ref 135–145)
Total Bilirubin: 9.1 mg/dL — ABNORMAL HIGH (ref 0.3–1.2)
Total Protein: 8.8 g/dL — ABNORMAL HIGH (ref 6.5–8.1)

## 2022-02-19 LAB — RETICULOCYTES
Immature Retic Fract: 21.4 % — ABNORMAL HIGH (ref 2.3–15.9)
RBC.: 3.75 MIL/uL — ABNORMAL LOW (ref 4.22–5.81)
Retic Count, Absolute: 1064.5 10*3/uL — ABNORMAL HIGH (ref 19.0–186.0)
Retic Ct Pct: 28.4 % — ABNORMAL HIGH (ref 0.4–3.1)

## 2022-02-19 LAB — LACTATE DEHYDROGENASE: LDH: 380 U/L — ABNORMAL HIGH (ref 98–192)

## 2022-02-19 MED ORDER — SODIUM CHLORIDE 0.45 % IV SOLN: INTRAVENOUS | Status: DC

## 2022-02-19 MED ORDER — NALOXONE HCL 0.4 MG/ML IJ SOLN: 0.4000 mg | INTRAMUSCULAR | Status: DC | PRN

## 2022-02-19 MED ORDER — HYDROMORPHONE HCL 2 MG/ML IJ SOLN
2.0000 mg | INTRAMUSCULAR | Status: AC
  Administered 2022-02-19 (×3): 2 mg via INTRAVENOUS
  Filled 2022-02-19 (×3): qty 1

## 2022-02-19 MED ORDER — DIPHENHYDRAMINE HCL 25 MG PO CAPS: 25.0000 mg | ORAL_CAPSULE | Freq: Once | ORAL | Status: DC

## 2022-02-19 MED ORDER — POLYETHYLENE GLYCOL 3350 17 G PO PACK: 17.0000 g | PACK | Freq: Every day | ORAL | Status: DC | PRN

## 2022-02-19 MED ORDER — SODIUM CHLORIDE 0.9% FLUSH: 9.0000 mL | INTRAVENOUS | Status: DC | PRN

## 2022-02-19 MED ORDER — ALPRAZOLAM 0.5 MG PO TABS: 1.0000 mg | ORAL_TABLET | Freq: Two times a day (BID) | ORAL | Status: DC | PRN

## 2022-02-19 MED ORDER — HYDROMORPHONE 1 MG/ML IV SOLN
INTRAVENOUS | Status: DC
  Administered 2022-02-19: 4.5 mg via INTRAVENOUS
  Administered 2022-02-19: 5.5 mg via INTRAVENOUS
  Administered 2022-02-20: 9 mg via INTRAVENOUS
  Administered 2022-02-20 (×2): 4 mg via INTRAVENOUS
  Filled 2022-02-19: qty 30

## 2022-02-19 MED ORDER — OXYCODONE HCL 5 MG PO TABS
15.0000 mg | ORAL_TABLET | Freq: Once | ORAL | Status: AC
  Administered 2022-02-19: 15 mg via ORAL
  Filled 2022-02-19: qty 3

## 2022-02-19 MED ORDER — ONDANSETRON HCL 4 MG/2ML IJ SOLN: 4.0000 mg | Freq: Four times a day (QID) | INTRAMUSCULAR | Status: DC | PRN

## 2022-02-19 MED ORDER — ONDANSETRON HCL 4 MG/2ML IJ SOLN: 4.0000 mg | Freq: Once | INTRAMUSCULAR | Status: DC

## 2022-02-19 MED ORDER — DIPHENHYDRAMINE HCL 25 MG PO CAPS
25.0000 mg | ORAL_CAPSULE | ORAL | Status: DC | PRN
  Administered 2022-02-20: 25 mg via ORAL
  Filled 2022-02-19: qty 1

## 2022-02-19 MED ORDER — APIXABAN 5 MG PO TABS
5.0000 mg | ORAL_TABLET | Freq: Two times a day (BID) | ORAL | Status: DC
  Administered 2022-02-19 – 2022-02-20 (×2): 5 mg via ORAL
  Filled 2022-02-19 (×2): qty 1

## 2022-02-19 MED ORDER — ACETAMINOPHEN 500 MG PO TABS
1000.0000 mg | ORAL_TABLET | Freq: Once | ORAL | Status: AC
  Administered 2022-02-19: 1000 mg via ORAL
  Filled 2022-02-19: qty 2

## 2022-02-19 MED ORDER — SENNOSIDES-DOCUSATE SODIUM 8.6-50 MG PO TABS
1.0000 | ORAL_TABLET | Freq: Two times a day (BID) | ORAL | Status: DC
  Administered 2022-02-19: 1 via ORAL
  Filled 2022-02-19 (×2): qty 1

## 2022-02-19 MED ORDER — MELATONIN 3 MG PO TABS: 6.0000 mg | ORAL_TABLET | Freq: Every evening | ORAL | Status: DC | PRN

## 2022-02-19 MED ORDER — ABACAVIR-DOLUTEGRAVIR-LAMIVUD 600-50-300 MG PO TABS
1.0000 | ORAL_TABLET | Freq: Every day | ORAL | Status: DC
  Administered 2022-02-19: 1 via ORAL
  Filled 2022-02-19 (×2): qty 1

## 2022-02-19 MED ORDER — HYDROXYUREA 500 MG PO CAPS
2000.0000 mg | ORAL_CAPSULE | Freq: Every day | ORAL | Status: DC
  Administered 2022-02-19: 2000 mg via ORAL
  Filled 2022-02-19: qty 4

## 2022-02-19 NOTE — Telephone Encounter (Signed)
Patient called the day hospital looking to come in for sickle cell pain crisis. Patient reported 9/10 pain to lower hips and back. Pt reported taking Percocet 10 mg at 630 AM. Pt screened for COVID, and denied any symptoms. Pt denies nausea, vomiting, diarrhea, abdominal pain, and priapism. [place], [place] notified. Provider says that pt can come to day hospital today. Pt notified and verbalized understanding. Pt says that he will have a family member drive him to and from day hospital.

## 2022-02-19 NOTE — H&P (Signed)
[facility] History and Physical   Date: 02/19/2022  Patient name: Martin [name] Medical record number: [id number] Date of birth: Jun 21, 1990 Age: 31 y.o. Gender: male PCP: Massie Maroon, [name]  Attending physician: Quentin Angst, MD  Chief Complaint: Sickle cell pain   History of Present Illness: Martin [name] is a 31 year old male with a medical history significant for sickle cell disease, chronic respiratory failure on home oxygen, chronic pain syndrome, opiate dependence and tolerance, HIV disease, and mild intermittent asthma presents to sickle cell day infusion clinic with complaints of allover body pain.  Patient states that pain intensity has been increasing over the past several days and unrelieved by his home medications.  He last had Percocet this a.m. without any sustained relief.  He attributes current pain crisis to changes in weather.  He currently denies fever, chills, chest pain, urinary symptoms, nausea, vomiting, or diarrhea.  No sick contacts, recent travel, or known exposure to COVID-19.  Meds: Medications Prior to Admission  Medication Sig Dispense Refill Last Dose   abacavir-dolutegravir-lamiVUDine (TRIUMEQ) 600-50-300 MG tablet Take 1 tablet by mouth at bedtime.      ALPRAZolam (XANAX) 1 MG tablet Take 1 mg by mouth 2 (two) times daily as needed for anxiety. (Patient not taking: Reported on 12/02/2021)      apixaban (ELIQUIS) 5 MG TABS tablet Take 1 tablet (5 mg total) by mouth 2 (two) times daily. 60 tablet 11    cetirizine (ZYRTEC) 10 MG tablet Take 10 mg by mouth daily as needed for allergies.      Cyanocobalamin (B-12) 1000 MCG CAPS Take 1 capsule by mouth daily.      cyclobenzaprine (FLEXERIL) 10 MG tablet Take 10 mg by mouth every 8 (eight) hours as needed.      ergocalciferol (VITAMIN D2) 1.25 MG (50000 UT) capsule Take 1 capsule (50,000 Units total) by mouth once a week. 12 capsule 3    hydroxyurea (HYDREA) 500 MG capsule Take 4  capsules (2,000 mg total) by mouth at bedtime. 120 capsule 11    melatonin 3 MG TABS tablet Take 6 mg by mouth at bedtime as needed (sleep).      naloxone (NARCAN) nasal spray 4 mg/0.1 mL Place 4 mg into the nose as needed for opioid reversal.      oxyCODONE-acetaminophen (PERCOCET) 10-325 MG tablet Take 1 tablet by mouth every 4 (four) hours as needed for pain. 90 tablet 0     Allergies: Ketorolac tromethamine, Tape, and Wound dressing adhesive Past Medical History:  Diagnosis Date   Anxiety    HIV (human immunodeficiency virus infection) (HCC)    Proteinuria    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HCC)    Vitamin D deficiency 10/2018   Past Surgical History:  Procedure Laterality Date   IR IMAGING GUIDED PORT INSERTION  08/29/2019   IR REMOVAL TUN ACCESS W/ PORT W/O FL MOD SED  08/30/2020   TEE WITHOUT CARDIOVERSION N/A 08/30/2020   Procedure: TRANSESOPHAGEAL ECHOCARDIOGRAM (TEE);  Surgeon: [name], MD;  [facility] END[facility]COPY;  Service: Cardiovascular;  Laterality: N/A;   Family History  Problem Relation Age of Onset   Sickle cell trait Mother    Sickle cell trait Father    Birth defects Maternal Grandmother    Birth defects Paternal Grandmother    Social History   Socioeconomic History   Marital status: Single    Spouse name: Not on file   Number of children: Not on file  Years of education: Not on file   Highest education level: Not on file  Occupational History   Occupation: disabled  Tobacco Use   Smoking status: Never   Smokeless tobacco: Never  Vaping Use   Vaping Use: Never used  Substance and Sexual Activity   Alcohol use: No   Drug use: Yes    Types: Marijuana    Comment: 2x week   Sexual activity: Yes    Birth control/protection: Condom  Other Topics Concern   Not on file  Social History Narrative   Not on file   Social Determinants of Health   Financial Resource Strain: Not on file  Food Insecurity: Not on file  Transportation Needs: Not on file   Physical Activity: Not on file  Stress: Not on file  Social Connections: Not on file  Intimate Partner Violence: Not on file   Review of Systems  Constitutional: Negative.   HENT: Negative.    Eyes: Negative.   Respiratory: Negative.    Gastrointestinal: Negative.   Genitourinary: Negative.   Musculoskeletal:  Positive for back pain and joint pain.  Skin: Negative.   Neurological: Negative.   Psychiatric/Behavioral: Negative.      Physical Exam: There were no vitals taken for this visit. Physical Exam Constitutional:      Appearance: Normal appearance.  Eyes:     Pupils: Pupils are equal, round, and reactive to light.  Pulmonary:     Effort: Pulmonary effort is normal.     Breath sounds: Normal breath sounds.  Abdominal:     General: Bowel sounds are normal.  Musculoskeletal:        General: Normal range of motion.  Skin:    General: Skin is warm.  Neurological:     General: No focal deficit present.     Mental Status: He is alert. Mental status is at baseline.  Psychiatric:        Mood and Affect: Mood normal.        Behavior: Behavior normal.        Thought Content: Thought content normal.        Judgment: Judgment normal.      Lab results: No results found for this or any previous visit (from the past 24 hour(s)).  Imaging results:  No results found.   Assessment & Plan: Patient admitted to sickle cell day infusion center for management of pain crisis.  Patient is opiate tolerant Initiate IV dilaudid  IV fluids, 0.45% saline at 150 mL/h Tylenol 1000 mg by mouth times one dose Review CBC with differential, complete metabolic panel, and reticulocytes as results become available. Pain intensity will be reevaluated in context of functioning and relationship to baseline as care progresses If pain intensity remains elevated and/or sudden change in hemodynamic stability transition to inpatient services for higher level of care.      Donia Pounds   APRN, [name], [name] [facility] [address] [place], [place] [address] [phone]  02/19/2022, 12:52 PM

## 2022-02-19 NOTE — Progress Notes (Signed)
Patient admitted to day hospital today for sickle cell pain crisis. On arrival, pt reported 10/10 pain to back and bilateral legs. Pt PAC accessed using sterile technique.Pt received 3 doses of IV Dilaudid 2 mg pushes and hydrated with IV fluids via PAC. Pt also received 1,000 mg of PO Tylenol and 20 mg Oxycodone. Due to pain not being managed, pt admitted to [facility] PAC remains accessed. On transfer, pt rates pain 8/10. Report given, and pt transferred to [facility] in wheel chair.

## 2022-02-20 DIAGNOSIS — D5700 Hb-SS disease with crisis, unspecified: Secondary | ICD-10-CM | POA: Diagnosis not present

## 2022-02-20 LAB — URINALYSIS, COMPLETE (UACMP) WITH MICROSCOPIC
Bilirubin Urine: NEGATIVE
Glucose, UA: NEGATIVE mg/dL
Hgb urine dipstick: NEGATIVE
Ketones, ur: NEGATIVE mg/dL
Leukocytes,Ua: NEGATIVE
Nitrite: NEGATIVE
Protein, ur: NEGATIVE mg/dL
Specific Gravity, Urine: 1.006 (ref 1.005–1.030)
pH: 5 (ref 5.0–8.0)

## 2022-02-20 LAB — CBC
HCT: 22.1 % — ABNORMAL LOW (ref 39.0–52.0)
Hemoglobin: 8.1 g/dL — ABNORMAL LOW (ref 13.0–17.0)
MCH: 35.7 pg — ABNORMAL HIGH (ref 26.0–34.0)
MCHC: 36.7 g/dL — ABNORMAL HIGH (ref 30.0–36.0)
MCV: 97.4 fL (ref 80.0–100.0)
Platelets: 397 10*3/uL (ref 150–400)
RBC: 2.27 MIL/uL — ABNORMAL LOW (ref 4.22–5.81)
RDW: 22.5 % — ABNORMAL HIGH (ref 11.5–15.5)
WBC: 10.2 10*3/uL (ref 4.0–10.5)
nRBC: 0.4 % — ABNORMAL HIGH (ref 0.0–0.2)

## 2022-02-20 MED ORDER — HEPARIN SOD (PORK) LOCK FLUSH 100 UNIT/ML IV SOLN
500.0000 [IU] | Freq: Once | INTRAVENOUS | Status: AC
  Administered 2022-02-20: 500 [IU] via INTRAVENOUS
  Filled 2022-02-20: qty 5

## 2022-02-20 MED ORDER — CHLORHEXIDINE GLUCONATE CLOTH 2 % EX PADS
6.0000 | MEDICATED_PAD | Freq: Every day | CUTANEOUS | Status: DC
  Administered 2022-02-20: 6 via TOPICAL

## 2022-02-20 NOTE — Progress Notes (Signed)
Discharge instructions, RX's and follow up appts explained and provided to patient verbalized understanding. Patient refused wheelchair and left floor walking independently.  Tzipora Mcinroy, Tivis Ringer, RN

## 2022-02-20 NOTE — Progress Notes (Signed)
Mobility Specialist - Progress Note   02/20/22 1216  Mobility  Activity Ambulated independently in hallway  Level of Assistance Independent after set-up  Assistive Device None  Distance Ambulated (ft) 750 ft  Activity Response Tolerated well  Mobility Referral Yes  $Mobility charge 1 Mobility   Pt received in bed and agreed to mobility, no c/o pain nor discomfort. Pt back to bed with all needs met.  Roderick Pee Mobility Specialist

## 2022-02-20 NOTE — TOC Transition Note (Signed)
Transition of Care [facility]) - CM/SW Discharge Note   Patient Details  Name: Martin [name] MRN: [id number] Date of Birth: 17-Jan-1991  Transition of Care [facility] CM/SW Contact:  Angelita Ingles, RN Phone Number:[phone]  02/20/2022, 2:32 PM   Clinical Narrative:    Patient discharging. No TOC needs noted.          Patient Goals and CMS Choice        Discharge Placement                       Discharge Plan and Services                                     Social Determinants of Health (SDOH) Interventions     Readmission Risk Interventions    08/15/2021    1:48 PM 05/28/2021    2:18 PM 04/04/2020   11:22 AM  Readmission Risk Prevention Plan  Transportation Screening Complete Complete Complete  PCP or Specialist Appt within 3-5 Days  Complete   HRI or [place]  Complete   Social Work Consult for [place] Planning/Counseling  Complete   Palliative Care Screening  Not Applicable   Medication Review [employer] photographer) Complete Complete Complete  PCP or Specialist appointment within 3-5 days of discharge Complete  Not Complete  PCP/Specialist Appt Not Complete comments   Not ready for dc  HRI or Home Care Consult Complete  Not Complete  HRI or Home Care Consult Pt Refusal Comments   Not needed  SW Recovery Care/Counseling Consult Complete  Complete  Palliative Care Screening Not Applicable  Not [place] Not Applicable  Not Applicable

## 2022-02-20 NOTE — Discharge Summary (Signed)
Physician Discharge Summary  Martin [name] OXB:[id number] DOB: 07-28-1990 DOA: 02/19/2022  PCP: [name], [name]  Admit date: 02/19/2022  Discharge date: 02/20/2022  Discharge Diagnoses:  Principal Problem:   Sickle cell pain crisis [facility])   Discharge Condition: Stable  Disposition:   Follow-up Information     [name], [name] Follow up.   Specialty: Family Medicine Contact information: [place]. [place] [address] [phone]                Pt is discharged home in good condition and is to follow up with [name], [name] this week to have labs evaluated. Martin [name] is instructed to increase activity slowly and balance with rest for the next few days, and use prescribed medication to complete treatment of pain  Diet: Regular Wt Readings from Last 3 Encounters:  12/02/21 62.6 kg  09/02/21 68 kg  08/11/21 68 kg    History of present illness:  History of Present Illness: Martin [name] is a 31 year old male with a medical history significant for sickle cell disease, chronic respiratory failure on home oxygen, chronic pain syndrome, opiate dependence and tolerance, HIV disease, and mild intermittent asthma presents to sickle cell day infusion clinic with complaints of allover body pain.  Patient states that pain intensity has been increasing over the past several days and unrelieved by his home medications.  He last had Percocet this a.m. without any sustained relief.  He attributes current pain crisis to changes in weather.  He currently denies fever, chills, chest pain, urinary symptoms, nausea, vomiting, or diarrhea.  No sick contacts, recent travel, or known exposure to COVID-19.  Hospital Course:  Sickle cell pain crisis: Patient was admitted for sickle cell pain crisis and managed appropriately with IVF, IV Dilaudid via PCA and IV Toradol, as well as other adjunct therapies per sickle cell pain management  protocols.  Patient was therefore discharged home today in a hemodynamically stable condition.   Martin [name] will follow-up with PCP within 1 week of this discharge. Martin [name] was counseled extensively about nonpharmacologic means of pain management, patient verbalized understanding and was appreciative of  the care received during this admission.   We discussed the need for good hydration, monitoring of hydration status, avoidance of heat, cold, stress, and infection triggers. We discussed the need to be adherent with taking Hydrea and other home medications. Patient was reminded of the need to seek medical attention immediately if any symptom of bleeding, anemia, or infection occurs.  Discharge Exam: Vitals:   02/20/22 1246 02/20/22 1341  BP:  110/78  Pulse:  81  Resp: 16 18  Temp:  97.8 F (36.6 C)  SpO2: 93% 91%   Vitals:   02/20/22 1011 02/20/22 1028 02/20/22 1246 02/20/22 1341  BP: (!) 105/58   110/78  Pulse: 74 80  81  Resp: _0 Temp: 98.2 F (36.8 C)   97.8 F (36.6 C)  TempSrc: Oral   Oral  SpO2:  90% 93% 91%    General appearance : Awake, alert, not in any distress. Speech Clear. Not toxic looking HEENT: Atraumatic and Normocephalic, pupils equally reactive to light and accomodation Neck: Supple, no JVD. No cervical lymphadenopathy.  Chest: Good air entry bilaterally, no added sounds  CVS: S1 S2 regular, no murmurs.  Abdomen: Bowel sounds present, Non tender and not distended with no gaurding, rigidity or rebound. Extremities: B/L Lower Ext shows no edema, both legs  are warm to touch Neurology: Awake alert, and oriented X 3, CN II-XII intact, Non focal Skin: No Rash  Discharge Instructions  Discharge Instructions     Discharge patient   Complete by: As directed    Discharge disposition: 01-Home or Self Care   Discharge patient date: 02/20/2022      Allergies as of 02/20/2022       Reactions   Ketorolac Tromethamine Swelling, Other (See Comments)    Patient reports facial edema and left arm edema after administration.   Tape Rash, Other (See Comments)   PLEASE DO NOT USE THE CLEAR, THICK, "PLASTIC" TAPE- only paper tape is tolerated    Wound Dressing Adhesive Rash        Medication List     TAKE these medications    ALPRAZolam 1 MG tablet Commonly known as: XANAX Take 1 mg by mouth 2 (two) times daily as needed for anxiety.   B-12 1000 MCG Caps Take 1 capsule by mouth daily.   Banophen 25 mg capsule Generic drug: diphenhydrAMINE Take by mouth.   cetirizine 10 MG tablet Commonly known as: ZYRTEC Take 10 mg by mouth daily as needed for allergies.   cyclobenzaprine 10 MG tablet Commonly known as: FLEXERIL Take 10 mg by mouth every 8 (eight) hours as needed.   docusate sodium 100 MG capsule Commonly known as: COLACE Take 100 mg by mouth 2 (two) times daily.   Eliquis 5 MG Tabs tablet Generic drug: apixaban Take 1 tablet (5 mg total) by mouth 2 (two) times daily.   ergocalciferol 1.25 MG (50000 UT) capsule Commonly known as: VITAMIN D2 Take 1 capsule (50,000 Units total) by mouth once a week.   folic acid 1 MG tablet Commonly known as: FOLVITE Take 1 mg by mouth daily.   gabapentin 100 MG capsule Commonly known as: NEURONTIN Take 200 mg by mouth every 8 (eight) hours as needed.   HYDROmorphone 2 MG tablet Commonly known as: DILAUDID Take 1 mg by mouth every 6 (six) hours as needed.   hydroxyurea 500 MG capsule Commonly known as: HYDREA Take 4 capsules (2,000 mg total) by mouth at bedtime.   lamivudine 300 MG tablet Commonly known as: EPIVIR Take 300 mg by mouth daily.   melatonin 3 MG Tabs tablet Take 6 mg by mouth at bedtime as needed (sleep).   naloxone 4 MG/0.1ML Liqd nasal spray kit Commonly known as: NARCAN Place 4 mg into the nose as needed for opioid reversal.   oxyCODONE 5 MG immediate release tablet Commonly known as: Oxy IR/ROXICODONE Take 5 mg by mouth every 4 (four) hours as  needed.   oxyCODONE-acetaminophen 10-325 MG tablet Commonly known as: Percocet Take 1 tablet by mouth every 4 (four) hours as needed for pain.   Tivicay 50 MG tablet Generic drug: dolutegravir Take 50 mg by mouth daily.   Triumeq 600-50-300 MG tablet Generic drug: abacavir-dolutegravir-lamiVUDine Take 1 tablet by mouth at bedtime.        The results of significant diagnostics from this hospitalization (including imaging, microbiology, ancillary and laboratory) are listed below for reference.    Significant Diagnostic Studies: No results found.  Microbiology: No results found for this or any previous visit (from the past 240 hour(s)).   Labs: Basic Metabolic Panel: Recent Labs  Lab 02/19/22 1250  NA 140  K 4.2  CL 111  CO2 24  GLUCOSE 84  BUN 10  CREATININE 0.88  CALCIUM 8.9   Liver Function Tests: Recent Labs  Lab 02/19/22 1250  AST 23  ALT 13  ALKPHOS 62  BILITOT 9.1*  PROT 8.8*  ALBUMIN 3.9   No results for input(s): "LIPASE", "AMYLASE" in the last 168 hours. No results for input(s): "AMMONIA" in the last 168 hours. CBC: Recent Labs  Lab 02/19/22 1250 02/20/22 0608  WBC 11.4* 10.2  NEUTROABS 4.9  --   HGB 9.5* 8.1*  HCT 26.0* 22.1*  MCV 97.4 97.4  PLT 445* 397   Cardiac Enzymes: No results for input(s): "CKTOTAL", "CKMB", "CKMBINDEX", "TROPONINI" in the last 168 hours. BNP: Invalid input(s): "POCBNP" CBG: No results for input(s): "GLUCAP" in the last 168 hours.  Time coordinating discharge: 50 minutes  Signed:  Donia Pounds  APRN, [name], [name] Patient [facility] [place], [place] [address] [phone]  [facility] 02/20/2022, 1:47 PM

## 2022-02-20 NOTE — Plan of Care (Signed)
  Problem: Education: Goal: Knowledge of vaso-occlusive preventative measures will improve Outcome: Adequate for Discharge Goal: Awareness of infection prevention will improve Outcome: Adequate for Discharge Goal: Awareness of signs and symptoms of anemia will improve Outcome: Adequate for Discharge Goal: Long-term complications will improve Outcome: Adequate for Discharge   Problem: Self-Care: Goal: Ability to incorporate actions that prevent/reduce pain crisis will improve Outcome: Adequate for Discharge   Problem: Bowel/Gastric: Goal: Gut motility will be maintained Outcome: Adequate for Discharge   Problem: Tissue Perfusion: Goal: Complications related to inadequate tissue perfusion will be avoided or minimized Outcome: Adequate for Discharge   Problem: Respiratory: Goal: Pulmonary complications will be avoided or minimized Outcome: Adequate for Discharge Goal: Acute Chest Syndrome will be identified early to prevent complications Outcome: Adequate for Discharge   Problem: Fluid Volume: Goal: Ability to maintain a balanced intake and output will improve Outcome: Adequate for Discharge   Problem: Sensory: Goal: Pain level will decrease with appropriate interventions Outcome: Adequate for Discharge   Problem: Health Behavior: Goal: Postive changes in compliance with treatment and prescription regimens will improve Outcome: Adequate for Discharge   Problem: Education: Goal: Knowledge of General Education information will improve Description: Including pain rating scale, medication(s)/side effects and non-pharmacologic comfort measures Outcome: Adequate for Discharge   Problem: Health Behavior/Discharge Planning: Goal: Ability to manage health-related needs will improve Outcome: Adequate for Discharge   Problem: Clinical Measurements: Goal: Ability to maintain clinical measurements within normal limits will improve Outcome: Adequate for Discharge Goal: Will remain  free from infection Outcome: Adequate for Discharge Goal: Diagnostic test results will improve Outcome: Adequate for Discharge Goal: Respiratory complications will improve Outcome: Adequate for Discharge Goal: Cardiovascular complication will be avoided Outcome: Adequate for Discharge   Problem: Clinical Measurements: Goal: Ability to maintain clinical measurements within normal limits will improve Outcome: Adequate for Discharge Goal: Will remain free from infection Outcome: Adequate for Discharge Goal: Diagnostic test results will improve Outcome: Adequate for Discharge Goal: Respiratory complications will improve Outcome: Adequate for Discharge Goal: Cardiovascular complication will be avoided Outcome: Adequate for Discharge   Problem: Activity: Goal: Risk for activity intolerance will decrease Outcome: Adequate for Discharge   Problem: Nutrition: Goal: Adequate nutrition will be maintained Outcome: Adequate for Discharge   Problem: Coping: Goal: Level of anxiety will decrease Outcome: Adequate for Discharge   Problem: Coping: Goal: Level of anxiety will decrease Outcome: Adequate for Discharge   Problem: Elimination: Goal: Will not experience complications related to bowel motility Outcome: Adequate for Discharge Goal: Will not experience complications related to urinary retention Outcome: Adequate for Discharge   Problem: Pain Managment: Goal: General experience of comfort will improve Outcome: Adequate for Discharge   Problem: Safety: Goal: Ability to remain free from injury will improve Outcome: Adequate for Discharge   Problem: Skin Integrity: Goal: Risk for impaired skin integrity will decrease Outcome: Adequate for Discharge

## 2022-03-02 ENCOUNTER — Telehealth: Payer: Self-pay | Admitting: Family Medicine

## 2022-03-02 NOTE — Telephone Encounter (Signed)
Percocet

## 2022-03-03 ENCOUNTER — Other Ambulatory Visit: Payer: Self-pay | Admitting: Family Medicine

## 2022-03-03 DIAGNOSIS — D571 Sickle-cell disease without crisis: Secondary | ICD-10-CM

## 2022-03-03 DIAGNOSIS — F1190 Opioid use, unspecified, uncomplicated: Secondary | ICD-10-CM

## 2022-03-03 MED ORDER — OXYCODONE-ACETAMINOPHEN 10-325 MG PO TABS: 1.0000 | ORAL_TABLET | ORAL | 0 refills | Status: DC | PRN

## 2022-03-03 NOTE — Progress Notes (Signed)
Reviewed PDMP substance reporting system prior to prescribing opiate medications. No inconsistencies noted.  Meds ordered this encounter  Medications   oxyCODONE-acetaminophen (PERCOCET) 10-325 MG tablet    Sig: Take 1 tablet by mouth every 4 (four) hours as needed for pain.    Dispense:  90 tablet    Refill:  0    Order Specific Question:   Supervising Provider    Answer:   JEGEDE, OLUGBEMIGA E [[id number]]   Elyza Whitt Moore Lorrie Strauch  APRN, [name], [name] [facility] [address]  [place], [place] [address] [phone]  

## 2022-03-10 ENCOUNTER — Telehealth: Payer: Medicaid Other | Admitting: Family Medicine

## 2022-03-17 IMAGING — DX DG CHEST 1V PORT
1 series · 1 of 1 positions shown · non-contrast
Comparison: 01/02/2020

CLINICAL DATA: Shortness of breath.  Sickle cell pain.

EXAM:
PORTABLE CHEST 1 VIEW

[chest ap]
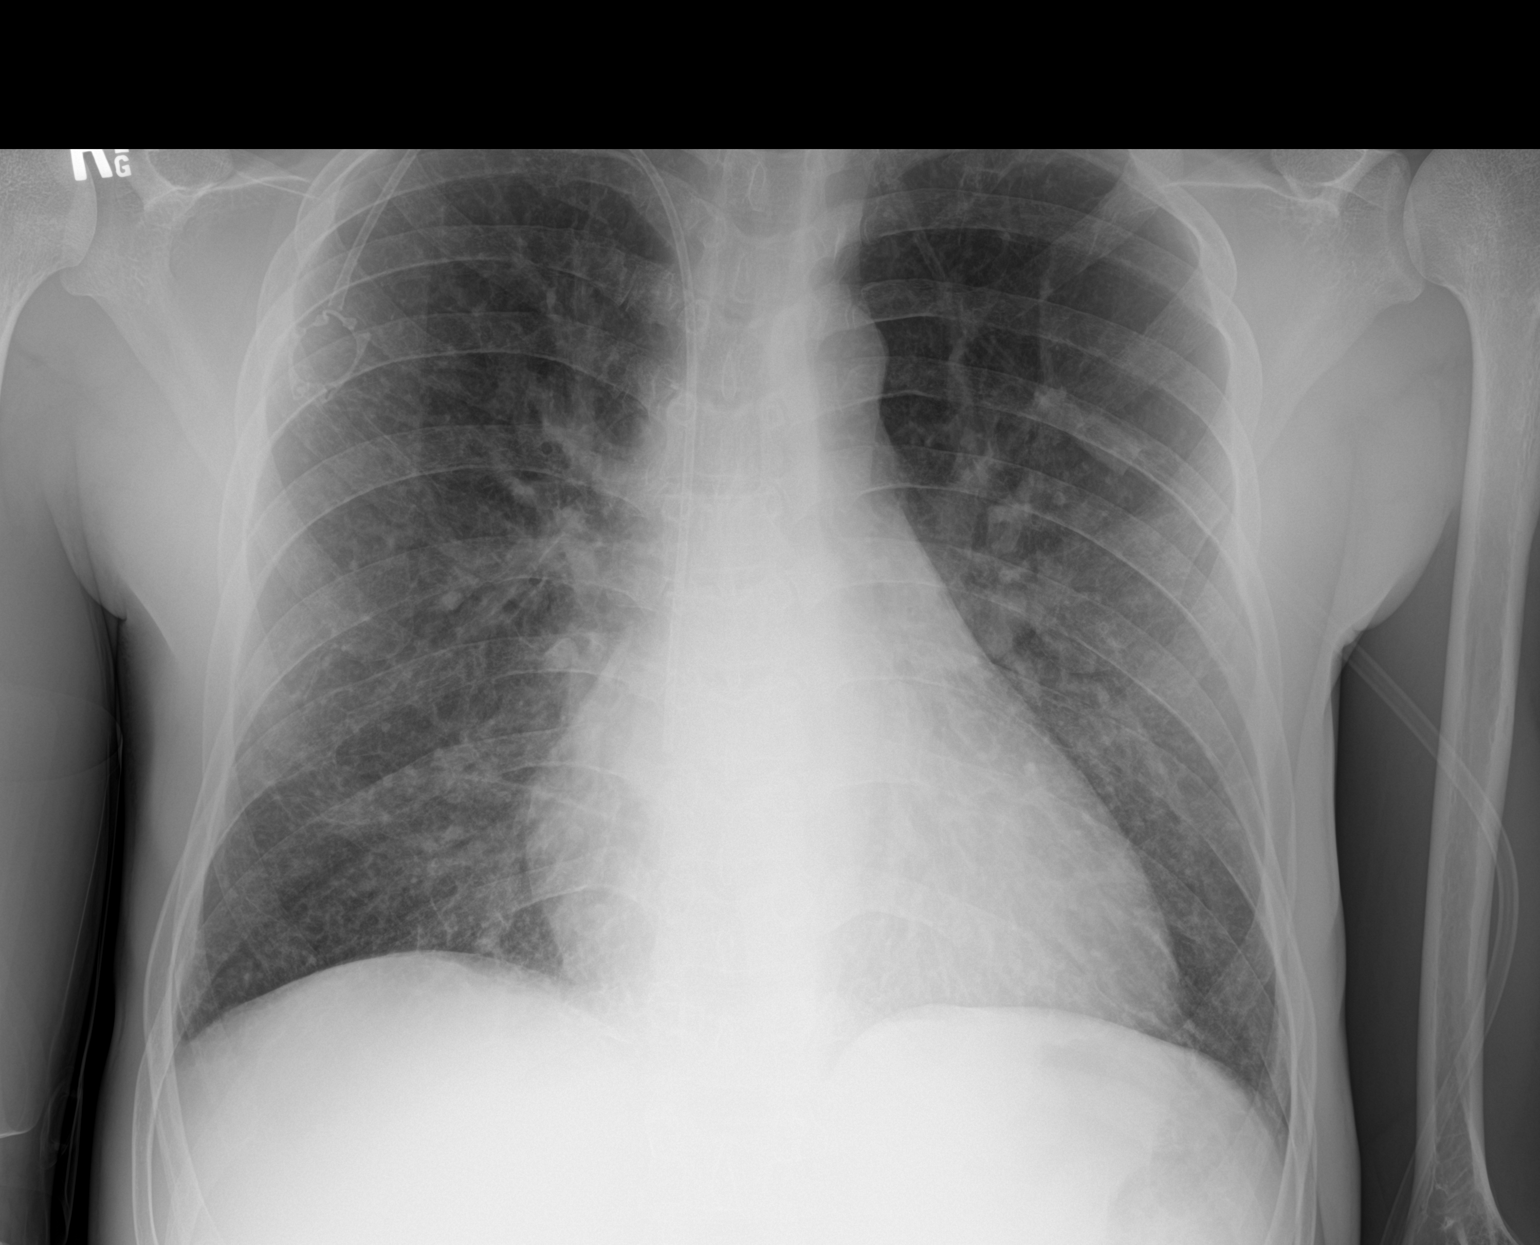

[1 of 1 positions shown; findings below may reference images not displayed]

FINDINGS: 9597 hours. Cardiopericardial silhouette is at upper limits of
normal for size. Interstitial markings are diffusely coarsened with
chronic features. No focal airspace consolidation. No pulmonary
edema or pleural effusion. Right Port-A-Cath again noted. The
visualized bony structures of the thorax show no acute abnormality.
IMPRESSION: No active disease.

## 2022-03-18 ENCOUNTER — Telehealth: Payer: Self-pay | Admitting: Family Medicine

## 2022-03-18 NOTE — Telephone Encounter (Signed)
Percocet refill request 

## 2022-03-19 ENCOUNTER — Other Ambulatory Visit: Payer: Self-pay | Admitting: Family Medicine

## 2022-03-19 DIAGNOSIS — F1190 Opioid use, unspecified, uncomplicated: Secondary | ICD-10-CM

## 2022-03-19 DIAGNOSIS — D571 Sickle-cell disease without crisis: Secondary | ICD-10-CM

## 2022-03-19 MED ORDER — OXYCODONE-ACETAMINOPHEN 10-325 MG PO TABS: 1.0000 | ORAL_TABLET | ORAL | 0 refills | Status: DC | PRN

## 2022-03-19 NOTE — Progress Notes (Signed)
Reviewed PDMP substance reporting system prior to prescribing opiate medications. No inconsistencies noted.  Meds ordered this encounter  Medications   oxyCODONE-acetaminophen (PERCOCET) 10-325 MG tablet    Sig: Take 1 tablet by mouth every 4 (four) hours as needed for pain.    Dispense:  90 tablet    Refill:  0    Order Specific Question:   Supervising Provider    Answer:   JEGEDE, OLUGBEMIGA E [[id number]]   Ajahni Nay Moore Niyam Bisping  APRN, [name], [name] [facility] [address]  [place], [place] [address] [phone]  

## 2022-03-26 ENCOUNTER — Telehealth: Payer: Self-pay

## 2022-03-26 NOTE — Telephone Encounter (Signed)
Caller & Relationship to patient:  MRN #  [id number]   Call Back Number:   Date of Last Office Visit: 03/18/2022     Date of Next Office Visit: 03/31/2022    Medication(s) to be Refilled: oxycodone   [facility]  ** Please notify patient to allow 48-72 hours to process** **Let patient know to contact pharmacy at the end of the day to make sure medication is ready. ** **If patient has not been seen in a year or longer, book an appointment **Advise to use MyChart for refill requests OR to contact their pharmacy

## 2022-03-27 ENCOUNTER — Other Ambulatory Visit: Payer: Self-pay | Admitting: Family Medicine

## 2022-03-27 ENCOUNTER — Other Ambulatory Visit (HOSPITAL_COMMUNITY): Payer: Self-pay

## 2022-03-27 DIAGNOSIS — D571 Sickle-cell disease without crisis: Secondary | ICD-10-CM

## 2022-03-27 DIAGNOSIS — F1190 Opioid use, unspecified, uncomplicated: Secondary | ICD-10-CM

## 2022-03-27 MED ORDER — OXYCODONE-ACETAMINOPHEN 10-325 MG PO TABS
1.0000 | ORAL_TABLET | ORAL | 0 refills | Status: DC | PRN
  Filled 2022-03-27 – 2022-04-09 (×2): qty 90, 15d supply, fill #0

## 2022-03-27 NOTE — Progress Notes (Signed)
Reviewed PDMP substance reporting system prior to prescribing opiate medications. No inconsistencies noted.  Meds ordered this encounter  Medications   oxyCODONE-acetaminophen (PERCOCET) 10-325 MG tablet    Sig: Take 1 tablet by mouth every 4 (four) hours as needed for pain.    Dispense:  90 tablet    Refill:  0    Order Specific Question:   Supervising Provider    Answer:   Quentin Angst [[id number]]   [name]  APRN, [name], [name] [facility] [address] [place], [place] [address] [phone]

## 2022-03-28 ENCOUNTER — Other Ambulatory Visit (HOSPITAL_COMMUNITY): Payer: Self-pay

## 2022-03-31 ENCOUNTER — Ambulatory Visit (INDEPENDENT_AMBULATORY_CARE_PROVIDER_SITE_OTHER): Payer: Medicaid Other | Admitting: Family Medicine

## 2022-03-31 ENCOUNTER — Encounter: Payer: Self-pay | Admitting: Family Medicine

## 2022-03-31 VITALS — BP 97/62 | HR 91 | Temp 98.2°F | Ht 74.0 in | Wt 139.6 lb

## 2022-03-31 DIAGNOSIS — D571 Sickle-cell disease without crisis: Secondary | ICD-10-CM | POA: Diagnosis not present

## 2022-03-31 DIAGNOSIS — G8929 Other chronic pain: Secondary | ICD-10-CM | POA: Diagnosis not present

## 2022-03-31 DIAGNOSIS — E559 Vitamin D deficiency, unspecified: Secondary | ICD-10-CM | POA: Diagnosis not present

## 2022-03-31 NOTE — Patient Instructions (Signed)
[facility] [address] [place], [place]  [phone]   Living With Sickle Cell Disease Living with a long-term condition, such as sickle cell disease, can be a challenge. It can affect both your physical and mental health. You may not have total control over your condition. But proper care and treatment can help manage the effects of the disease so you can feel good and lead an active life. You can take steps to manage your condition and stay as healthy as possible. How does sickle cell disease affect me? Sickle cell disease can cause challenges that affect your quality of life. You may get sick more often as a result of organ damage and infections. Sometimes you may need to stay in the hospital. Learn how to recognize that you are not feeling well and that you may be getting sick. What actions can I take to manage my condition?  The goals of treatment are to control your symptoms and prevent and treat problems. Work with your health care provider to create a treatment plan that works for you. Taking an active role in managing your condition can help you feel more in control of your situation. Ask about possible side effects of medicines that your health care provider recommends. Discuss how you feel about having those side effects. Keeping a healthy lifestyle can help you manage your condition. This includes eating a healthy diet, getting enough sleep, and getting regular exercise. Sickle cell disease may affect your ability to take care of your basic needs. Tell your health care provider if you have concerns about any of these needs: Access to food. Housing. Safe drinking water and other utilities. Safety in your home and community. Work or school. Transportation. Paying for health care. Your health care provider may be able to connect you with community resources that can help you. How to manage stress  Living with sickle cell disease can be stressful. This  disease can have a big impact on your mental health. Talk with your health care provider about ways to reduce your stress or if you have concerns about your mental health.  To cope with stress, try: Keeping a stress diary. This can help you learn what causes your stress to start (figure out your triggers) and how to control your response to those triggers. Spending time doing things that you enjoy, such as: Hobbies. Being outdoors. Spending time with friends and people who make you laugh. Doing yoga, muscle relaxation, deep breathing, or mindfulness practices. Expressing yourself through journal writing, art, crafting, poetry, or playing music. Staying positive about your health. Try to accept that you cannot control your condition perfectly. Follow these instructions at home: Medicines Take over-the-counter and prescription medicines only as told by your health care provider. If you were prescribed antibiotics, take them as told by your health care provider. Do not stop taking them even if you start to feel better. If you develop a fever, do not take medicines to reduce the fever right away. This could cover up another problem. Contact your health care provider. Eating and drinking Drink enough fluid to keep your urine pale yellow. Drink more in hot weather and during exercise. Limit or avoid drinking alcohol. Eat a balanced and nutritious diet. Eat plenty of fruits, vegetables, whole grains, and lean protein. Take vitamins and supplements as told by your health care provider. Traveling When traveling, keep these with you: Your medical information. The names of your health care providers. Your medicines. If you have   to travel by air, ask about precautions you should take. Managing pain Work with your health care provider to create a pain management plan that works for you. The plan may include: Ways to reduce or manage your pain at home, such as: Using a heating pad. Taking a warm  bath. Using healthy ways to distract you from the pain, such as hobbies or reading. Practicing ways to relax, such as doing yoga or listening to music. Getting massages. Doing exercises or stretches as told by a physical therapist. Tracking how pain affects your daily life functions. When to seek help. Who to contact and what to do in case of a pain emergency. General instructions Do not use any products that contain nicotine or tobacco. These products include cigarettes, chewing tobacco, and vaping devices, such as e-cigarettes. These lower blood oxygen levels. If you need help quitting, ask your health care provider. Consider wearing a medical alert bracelet. Use an app or journal to track your symptoms, assess your level of pain and fatigue, and keep track of your medicines. Avoid the following: High altitudes. Very high or low temperatures and big changes in temperature. Activities that will lower your oxygen levels, such as mountain climbing or doing exercise that takes a lot of effort. Stay up to date on: Your treatment plan. Learn as much as you can about your condition. Health screenings. This will help prevent problems or catch them early on. Vaccines. This will help prevent infection. Wash your hands often with soap and water to help prevent infections. Wash them for at least 20 seconds each time. Keep all follow-up visits. Regular follow-up with your health care provider can help you better manage your condition. Where to find support You can find help and support through: Talking with a therapist or taking part in support groups. Sickle Cell Disease Foundation of America: www.sicklecelldisease.org Where to find more information Centers for Disease Control and Prevention: www.cdc.gov American Society of Hematology: www.hematology.org Contact a health care provider if: Your symptoms get worse. You have new symptoms. You have a fever. Get help right away if: You have a  painful erection of the penis that lasts a long time (priapism). You become short of breath or are having trouble breathing. You have pain that cannot be controlled with medicine. You have any signs of a stroke. "BE FAST" is an easy way to remember the main warning signs: B - Balance. Dizziness, sudden trouble walking, or loss of balance. E - Eyes. Trouble seeing or a change in how you see. F - Face. Sudden weakness or loss of feeling of the face. The face or eyelid may droop on one side. A - Arms. Weakness or loss of feeling in an arm. This happens all of a sudden and most often on one side of the body. S - Speech. Sudden trouble speaking, slurred speech, or trouble understanding what people say. T - Time. Time to call emergency services. Write down what time symptoms started. You have other signs of a stroke, such as: A sudden, very bad headache with no known cause. Feeling like you may vomit (nausea). Vomiting. Seizure. These symptoms may be an emergency. Get help right away. Call 911. Do not wait to see if the symptoms will go away. Do not drive yourself to the hospital. Also, get help right away if: You have strong feelings of sadness or loss of hope, or you have thoughts about hurting yourself or others. Take one of these steps if you feel like   you may hurt yourself or others, or have thoughts about taking your own life: Go to your nearest emergency room. Call 911. Call the National Suicide Prevention Lifeline at 1-800-273-8255 or 988. This is open 24 hours a day. Text the Crisis Text Line at 741741. Summary Proper care and treatment can help manage the effects of sickle cell disease so you can feel good and lead an active life. The goals of treatment are to control your symptoms and prevent and treat problems. Taking an active role in managing your condition can help you feel more in control of your situation. Work with your health care provider to create a pain management plan  that works for you. Get medical help right away as told by your health care provider. This information is not intended to replace advice given to you by your health care provider. Make sure you discuss any questions you have with your health care provider. Document Revised: 07/14/2021 Document Reviewed: 07/14/2021 Elsevier Patient Education  2023 Elsevier Inc.  

## 2022-03-31 NOTE — Progress Notes (Signed)
Established Patient Office Visit  Subjective   Patient ID: Martin [name], male    DOB: 1990/07/16  Age: 31 y.o. MRN: [id number]  Chief Complaint  Patient presents with   Follow-up    Martin [name] is a very pleasant 31 year old male with a medical history significant for sickle cell disease, chronic pain syndrome, opiate dependence and tolerance, history of HIV, history of pulmonary embolus on Eliquis and anemia of chronic disease presents for follow-up of chronic conditions.  Patient states that he is doing well and has minimal complaints on today.  His major complaint is fatigue over the past week.  He attributes it to his work.  Patient is having pain today that is consistent with his typical chronic pain.  Pain is primarily to low back and lower extremities and is rated 5/10.  He last had Percocet on last night with maximum relief.  He denies any headache, chest pain, shortness of breath, urinary symptoms, nausea, vomiting, or diarrhea.  Of note, oxygen saturation around 92% on RA, patient wears home oxygen and does not have it with him on today.  He ensures that he wears oxygen at home.    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   Abnormal penile discharge, without blood 02/22/2020   Leukocytosis 01/02/2020   Anxiety 11/27/2019   Proteinuria 11/27/2019   Chronic respiratory failure ([place]) 11/16/2019   Chronic, continuous use of opioids 08/15/2019   Seasonal allergies 08/15/2019   Chest congestion    Sickle cell anemia with crisis ([place]) 08/04/2019   History of pulmonary embolus (PE) 08/04/2019   Single subsegmental pulmonary embolism without acute  cor pulmonale ([place]) 02/10/2019   Acute chest syndrome due to sickle cell crisis ([place]) 02/10/2019   Vaso-occlusive pain due to sickle cell disease ([place]) 03/09/2018   Bone pain 03/07/2018   Hip pain 03/07/2018   Acute bronchitis due to Streptococcus 02/21/2018   Heart murmur 02/03/2017   Sickle cell disease with crisis ([place]) 02/02/2017   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   Hb-SS disease without crisis ([place]) 12/20/2016   Vitamin D deficiency 08/14/2016   Sickle cell pain crisis ([place]) 02/12/2016   High risk medication use 09/27/2014   Generalized anxiety disorder 05/19/2014   Gastro-esophageal reflux disease without esophagitis 05/19/2014   Marijuana use 11/07/2012   Human immunodeficiency virus (HIV) disease ([place]) 05/23/2012   Past Medical History:  Diagnosis Date   Anxiety    HIV (human immunodeficiency virus infection) ([place])    Proteinuria    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[place])    Vitamin D deficiency 10/2018   Past Surgical History:  Procedure Laterality Date   IR IMAGING GUIDED PORT INSERTION  08/29/2019   IR REMOVAL TUN ACCESS W/ PORT W/O FL MOD SED  08/30/2020   TEE WITHOUT CARDIOVERSION N/A 08/30/2020   Procedure: TRANSESOPHAGEAL ECHOCARDIOGRAM (TEE);  Surgeon: Pixie Casino, MD;  Location: [facility] ENDOSCOPY;  Service: Cardiovascular;  Laterality: N/A;   Social History   Tobacco Use   Smoking status: Never   Smokeless tobacco: Never  Vaping Use   Vaping Use: Never used  Substance Use Topics   Alcohol use: No   Drug use:  Yes    Types: Marijuana    Comment: 2x week   Social History   Socioeconomic History   Marital status: Single    Spouse name: Not on file   Number of children: Not on file   Years of education: Not on file   Highest education level: Not on file  Occupational History   Occupation: disabled  Tobacco Use   Smoking status: Never   Smokeless tobacco: Never  Vaping Use   Vaping Use: Never used  Substance and Sexual Activity   Alcohol use: No   Drug  use: Yes    Types: Marijuana    Comment: 2x week   Sexual activity: Yes    Birth control/protection: Condom  Other Topics Concern   Not on file  Social History Narrative   Not on file   Social Determinants of Health   Financial Resource Strain: Not on file  Food Insecurity: Not on file  Transportation Needs: Not on file  Physical Activity: Not on file  Stress: Not on file  Social Connections: Not on file  Intimate Partner Violence: Not on file   Family Status  Relation Name Status   Mother  Alive   Father  Alive   Sister  Alive   Brother  Alive   MGM  Deceased   MGF  Deceased   PGM  Deceased   PGF  Deceased   Brother  Alive   Brother  Alive   Brother  Alive   Brother  Alive   Brother  Alive   Brother  Alive   Family History  Problem Relation Age of Onset   Sickle cell trait Mother    Sickle cell trait Father    Birth defects Maternal Grandmother    Birth defects Paternal Grandmother    Allergies  Allergen Reactions   Ketorolac Tromethamine Swelling and Other (See Comments)    Patient reports facial edema and left arm edema after administration.   Tape Rash and Other (See Comments)    PLEASE DO NOT USE THE CLEAR, THICK, "PLASTIC" TAPE- only paper tape is tolerated    Wound Dressing Adhesive Rash      Review of Systems  Constitutional: Negative.   HENT: Negative.    Eyes: Negative.   Respiratory: Negative.    Cardiovascular: Negative.  Negative for claudication and leg swelling.  Gastrointestinal: Negative.   Genitourinary: Negative.   Musculoskeletal:  Positive for back pain and joint pain.  Skin: Negative.   Neurological: Negative.      Objective:     BP 97/62   Pulse 91   Temp 98.2 F (36.8 C)   Ht _0  (1.88 m)   Wt 139 lb 9.6 oz (63.3 kg)   SpO2 92%   BMI 17.92 kg/m  BP Readings from Last 3 Encounters:  03/31/22 97/62  02/20/22 110/78  12/02/21 117/74   Wt Readings from Last 3 Encounters:  03/31/22 139 lb 9.6 oz (63.3 kg)   12/02/21 138 lb (62.6 kg)  09/02/21 150 lb (68 kg)      Physical Exam Constitutional:      Appearance: Normal appearance.  Eyes:     Pupils: Pupils are equal, round, and reactive to light.  Cardiovascular:     Rate and Rhythm: Normal rate and regular rhythm.  Pulmonary:     Effort: Pulmonary effort is normal.     Breath sounds: Normal breath sounds.  Abdominal:     General: Bowel sounds are normal.  Musculoskeletal:  General: Normal range of motion.  Skin:    General: Skin is warm.  Neurological:     General: No focal deficit present.     Mental Status: He is alert. Mental status is at baseline.  Psychiatric:        Mood and Affect: Mood normal.        Behavior: Behavior normal.        Thought Content: Thought content normal.        Judgment: Judgment normal.      Results for orders placed or performed in visit on 03/31/22  Sickle Cell Panel  Result Value Ref Range   Glucose 100 (H) 70 - 99 mg/dL   BUN 10 6 - 20 mg/dL   Creatinine, Ser 1.01 0.76 - 1.27 mg/dL   eGFR 101 >59 mL/min/1.73   BUN/Creatinine Ratio 10 9 - 20   Sodium 141 134 - 144 mmol/L   Potassium 5.8 (H) 3.5 - 5.2 mmol/L   Chloride 107 (H) 96 - 106 mmol/L   CO2 17 (L) 20 - 29 mmol/L   Calcium 9.2 8.7 - 10.2 mg/dL   Total Protein 9.1 (H) 6.0 - 8.5 g/dL   Albumin 4.6 4.1 - 5.1 g/dL   Globulin, Total 4.5 1.5 - 4.5 g/dL   Albumin/Globulin Ratio 1.0 (L) 1.2 - 2.2   Bilirubin Total 16.0 (HH) 0.0 - 1.2 mg/dL   Alkaline Phosphatase 81 44 - 121 IU/L   AST 50 (H) 0 - 40 IU/L   ALT 13 0 - 44 IU/L   Ferritin 1,746 (H) 30 - 400 ng/mL   Vit D, 25-Hydroxy 8.7 (L) 30.0 - 100.0 ng/mL   WBC 10.6 3.4 - 10.8 x10E3/uL   RBC 2.43 (LL) 4.14 - 5.80 x10E6/uL   Hemoglobin 9.5 (L) 13.0 - 17.7 g/dL   Hematocrit 26.1 (L) 37.5 - 51.0 %   MCV 107 (H) 79 - 97 fL   MCH 39.1 (H) 26.6 - 33.0 pg   MCHC 36.4 (H) 31.5 - 35.7 g/dL   RDW 28.2 (H) 11.6 - 15.4 %   Platelets 318 150 - 450 x10E3/uL   Neutrophils 33 Not Estab.  %   Lymphs 58 Not Estab. %   Monocytes 6 Not Estab. %   Eos 1 Not Estab. %   Basos 1 Not Estab. %   Neutrophils Absolute 3.5 1.4 - 7.0 x10E3/uL   Lymphocytes Absolute 6.1 (H) 0.7 - 3.1 x10E3/uL   Monocytes Absolute 0.7 0.1 - 0.9 x10E3/uL   EOS (ABSOLUTE) 0.1 0.0 - 0.4 x10E3/uL   Basophils Absolute 0.1 0.0 - 0.2 x10E3/uL   Immature Granulocytes 1 Not Estab. %   Immature Grans (Abs) 0.2 (H) 0.0 - 0.1 x10E3/uL   NRBC 53 (H) 0 - 0 %   Hematology Comments: Note:    Retic Ct Pct 18.9 (H) 0.6 - 2.6 %  [id number] 11+Oxyco+Alc+Crt-Bund  Result Value Ref Range   Ethanol WILL FOLLOW    Amphetamines, Urine WILL FOLLOW    Barbiturate WILL FOLLOW    BENZODIAZ UR QL WILL FOLLOW    Cannabinoid Quant, Ur WILL FOLLOW    CANNABINOIDS WILL FOLLOW    Carboxy THC GC/MS Conf WILL FOLLOW    Cocaine (Metabolite) WILL FOLLOW    OPIATE SCREEN URINE WILL FOLLOW    Opiates WILL FOLLOW      Codeine WILL FOLLOW       Codeine Confirm WILL FOLLOW      Morphine WILL FOLLOW  Morphine Confirm WILL FOLLOW      Hydromorphone WILL FOLLOW       Hydromorphone Confirm WILL FOLLOW      Hydrocodone WILL FOLLOW       Hydrocodone Confirm WILL FOLLOW    Oxycodone/Oxymorphone, Urine WILL FOLLOW    OXYCODONE/OXYMORPH WILL FOLLOW    OXYCODONE WILL FOLLOW    OXYCODONE WILL FOLLOW    OXYMORPHONE WILL FOLLOW    OXYMORPHONE (GC/MS) WILL FOLLOW    Phencyclidine WILL FOLLOW    Methadone Screen, Urine WILL FOLLOW    Propoxyphene WILL FOLLOW    Meperidine WILL FOLLOW    Tramadol WILL FOLLOW    Creatinine WILL FOLLOW    pH, Urine WILL FOLLOW     Last CBC Lab Results  Component Value Date   WBC 10.6 03/31/2022   HGB 9.5 (L) 03/31/2022   HCT 26.1 (L) 03/31/2022   MCV 107 (H) 03/31/2022   MCH 39.1 (H) 03/31/2022   RDW 28.2 (H) 03/31/2022   PLT 318 85/27/7824   Last metabolic panel Lab Results  Component Value Date   GLUCOSE 100 (H) 03/31/2022   NA 141 03/31/2022   K 5.8 (H) 03/31/2022   CL 107 (H) 03/31/2022    CO2 17 (L) 03/31/2022   BUN 10 03/31/2022   CREATININE 1.01 03/31/2022   GFRNONAA >60 02/19/2022   CALCIUM 9.2 03/31/2022   PHOS 5.6 (H) 06/28/2018   PROT 9.1 (H) 03/31/2022   ALBUMIN 4.6 03/31/2022   LABGLOB 4.5 03/31/2022   AGRATIO 1.0 (L) 03/31/2022   BILITOT 16.0 (HH) 03/31/2022   ALKPHOS 81 03/31/2022   AST 50 (H) 03/31/2022   ALT 13 03/31/2022   ANIONGAP 5 02/19/2022   Last lipids No results found for: "CHOL", "HDL", "LDLCALC", "LDLDIRECT", "[name]", "CHOLHDL" Last hemoglobin A1c Lab Results  Component Value Date   HGBA1C (L) 05/22/2009    <4.0 (NOTE) The ADA recommends the following therapeutic goal for glycemic control related to Hgb A1c measurement: Goal of therapy: <6.5 Hgb A1c  Reference: American Diabetes Association: Clinical Practice Recommendations 2010, Diabetes Care, 2010, 33: (Suppl  1).   Last thyroid functions No results found for: "TSH", "T3TOTAL", "T4TOTAL", "THYROIDAB" Last vitamin D Lab Results  Component Value Date   VD25OH 8.7 (L) 03/31/2022   Last vitamin B12 and Folate Lab Results  Component Value Date   VITAMINB12 238 02/02/2017   FOLATE 10.5 02/02/2017      The ASCVD Risk score (Arnett DK, et al., 2019) failed to calculate for the following reasons:   The 2019 ASCVD risk score is only valid for ages 17 to 48    Assessment & Plan:   Problem List Items Addressed This Visit       Other   Vitamin D deficiency - Primary   Relevant Orders   Sickle Cell Panel (Completed)   Hb-SS disease without crisis ([place])   Relevant Orders   Sickle Cell Panel (Completed)   [id number] 11+Oxyco+Alc+Crt-Bund (Completed)   Urinalysis Dipstick   Other Visit Diagnoses     Other chronic pain       Relevant Orders   [id number] 11+Oxyco+Alc+Crt-Bund (Completed)     Patient is up-to-date with vaccinations.  However, patient continues to decline COVID-vaccine. He is not up-to-date with ophthalmologist, he has been lost to follow-up.  Asked to make a first  available appointment. He will continue to follow-up with infectious disease as scheduled.  Patient states that he is taking medications consistently, however according to fill history patient has  not filled this medication in quite some time.  Also, patient has not been feeling hydroxyurea.  He states that he has an overflow of these medications, so he does not have to fill them.  Discussed the importance of taking these medications consistently in order to achieve positive outcomes.  Patient expressed understanding.  Return in about 3 months (around 06/30/2022) for sickle cell anemia.   Donia Pounds  APRN, [name], [name] [facility] [address] [place], [place] [address] [phone]

## 2022-04-02 ENCOUNTER — Other Ambulatory Visit (HOSPITAL_COMMUNITY): Payer: Self-pay

## 2022-04-02 LAB — CMP14+CBC/D/PLT+FER+RETIC+V...
ALT: 13 IU/L (ref 0–44)
AST: 50 IU/L — ABNORMAL HIGH (ref 0–40)
Albumin/Globulin Ratio: 1 — ABNORMAL LOW (ref 1.2–2.2)
Albumin: 4.6 g/dL (ref 4.1–5.1)
Alkaline Phosphatase: 81 IU/L (ref 44–121)
BUN/Creatinine Ratio: 10 (ref 9–20)
BUN: 10 mg/dL (ref 6–20)
Basophils Absolute: 0.1 10*3/uL (ref 0.0–0.2)
Basos: 1 %
Bilirubin Total: 16 mg/dL (ref 0.0–1.2)
CO2: 17 mmol/L — ABNORMAL LOW (ref 20–29)
Calcium: 9.2 mg/dL (ref 8.7–10.2)
Chloride: 107 mmol/L — ABNORMAL HIGH (ref 96–106)
Creatinine, Ser: 1.01 mg/dL (ref 0.76–1.27)
EOS (ABSOLUTE): 0.1 10*3/uL (ref 0.0–0.4)
Eos: 1 %
Ferritin: 1746 ng/mL — ABNORMAL HIGH (ref 30–400)
Globulin, Total: 4.5 g/dL (ref 1.5–4.5)
Glucose: 100 mg/dL — ABNORMAL HIGH (ref 70–99)
Hematocrit: 26.1 % — ABNORMAL LOW (ref 37.5–51.0)
Hemoglobin: 9.5 g/dL — ABNORMAL LOW (ref 13.0–17.7)
Immature Grans (Abs): 0.2 10*3/uL — ABNORMAL HIGH (ref 0.0–0.1)
Immature Granulocytes: 1 %
Lymphocytes Absolute: 6.1 10*3/uL — ABNORMAL HIGH (ref 0.7–3.1)
Lymphs: 58 %
MCH: 39.1 pg — ABNORMAL HIGH (ref 26.6–33.0)
MCHC: 36.4 g/dL — ABNORMAL HIGH (ref 31.5–35.7)
MCV: 107 fL — ABNORMAL HIGH (ref 79–97)
Monocytes Absolute: 0.7 10*3/uL (ref 0.1–0.9)
Monocytes: 6 %
NRBC: 53 % — ABNORMAL HIGH (ref 0–0)
Neutrophils Absolute: 3.5 10*3/uL (ref 1.4–7.0)
Neutrophils: 33 %
Platelets: 318 10*3/uL (ref 150–450)
Potassium: 5.8 mmol/L — ABNORMAL HIGH (ref 3.5–5.2)
RBC: 2.43 x10E6/uL — CL (ref 4.14–5.80)
RDW: 28.2 % — ABNORMAL HIGH (ref 11.6–15.4)
Retic Ct Pct: 18.9 % — ABNORMAL HIGH (ref 0.6–2.6)
Sodium: 141 mmol/L (ref 134–144)
Total Protein: 9.1 g/dL — ABNORMAL HIGH (ref 6.0–8.5)
Vit D, 25-Hydroxy: 8.7 ng/mL — ABNORMAL LOW (ref 30.0–100.0)
WBC: 10.6 10*3/uL (ref 3.4–10.8)
eGFR: 101 mL/min/{1.73_m2} (ref >59)

## 2022-04-07 LAB — DRUG SCREEN 764883 11+OXYCO+ALC+CRT-BUND
Amphetamines, Urine: NEGATIVE ng/mL
BENZODIAZ UR QL: NEGATIVE ng/mL
Barbiturate: NEGATIVE ng/mL
Cocaine (Metabolite): NEGATIVE ng/mL
Creatinine: 153.5 mg/dL (ref 20.0–300.0)
Ethanol: NEGATIVE %
Meperidine: NEGATIVE ng/mL
Methadone Screen, Urine: NEGATIVE ng/mL
Phencyclidine: NEGATIVE ng/mL
Propoxyphene: NEGATIVE ng/mL
Tramadol: NEGATIVE ng/mL
pH, Urine: 5.6 (ref 4.5–8.9)

## 2022-04-07 LAB — CANNABINOID CONFIRMATION, UR
CANNABINOIDS: POSITIVE — AB
Carboxy THC GC/MS Conf: 23 ng/mL

## 2022-04-07 LAB — OXYCODONE/OXYMORPHONE, CONFIRM
OXYCODONE/OXYMORPH: POSITIVE — AB
OXYCODONE: 2397 ng/mL
OXYCODONE: POSITIVE — AB
OXYMORPHONE (GC/MS): >3000 ng/mL
OXYMORPHONE: POSITIVE — AB

## 2022-04-07 LAB — OPIATES CONFIRMATION, URINE: Opiates: NEGATIVE ng/mL

## 2022-04-09 ENCOUNTER — Other Ambulatory Visit (HOSPITAL_COMMUNITY): Payer: Self-pay

## 2022-04-09 ENCOUNTER — Encounter (HOSPITAL_COMMUNITY): Payer: Self-pay

## 2022-04-09 NOTE — Telephone Encounter (Signed)
Caller & Relationship to patient:  MRN #  [id number]   Call Back Number:   Date of Last Office Visit: Visit date not found     Date of Next Office Visit: Visit date not found    Medication(s) to be Refilled: Percocet  Preferred Pharmacy:   ** Please notify patient to allow 48-72 hours to process** **Let patient know to contact pharmacy at the end of the day to make sure medication is ready. ** **If patient has not been seen in a year or longer, book an appointment **Advise to use MyChart for refill requests OR to contact their pharmacy    

## 2022-04-10 ENCOUNTER — Other Ambulatory Visit: Payer: Self-pay | Admitting: Family Medicine

## 2022-04-10 ENCOUNTER — Telehealth: Payer: Self-pay | Admitting: Family Medicine

## 2022-04-10 DIAGNOSIS — D571 Sickle-cell disease without crisis: Secondary | ICD-10-CM

## 2022-04-10 DIAGNOSIS — F1190 Opioid use, unspecified, uncomplicated: Secondary | ICD-10-CM

## 2022-04-10 MED ORDER — OXYCODONE-ACETAMINOPHEN 10-325 MG PO TABS: 1.0000 | ORAL_TABLET | ORAL | 0 refills | Status: DC | PRN

## 2022-04-10 NOTE — Telephone Encounter (Signed)
Pt called stating refill sent to wrong pharm. Please send to [facility] in [place] on [address]

## 2022-04-10 NOTE — Progress Notes (Signed)
Changed pharmacy.   Epsie Walthall Moore Loron Weimer  APRN, [name], [name] [facility] [address]  [place],  [address] [phone]  

## 2022-04-14 ENCOUNTER — Other Ambulatory Visit (HOSPITAL_COMMUNITY): Payer: Self-pay

## 2022-04-19 ENCOUNTER — Other Ambulatory Visit: Payer: Self-pay | Admitting: Family Medicine

## 2022-04-19 DIAGNOSIS — E559 Vitamin D deficiency, unspecified: Secondary | ICD-10-CM

## 2022-04-19 MED ORDER — ERGOCALCIFEROL 1.25 MG (50000 UT) PO CAPS: 50000.0000 [IU] | ORAL_CAPSULE | ORAL | 3 refills | Status: DC

## 2022-04-19 NOTE — Progress Notes (Signed)
Meds ordered this encounter  Medications   ergocalciferol (VITAMIN D2) 1.25 MG (50000 UT) capsule    Sig: Take 1 capsule (50,000 Units total) by mouth once a week.    Dispense:  12 capsule    Refill:  3    Order Specific Question:   Supervising Provider    Answer:   JEGEDE, OLUGBEMIGA E [[id number]]   Martin Melaragno Moore Bentley Haralson  APRN, [name], [name] [facility] [address]  [place], [place] [address] [phone]  

## 2022-05-01 NOTE — Telephone Encounter (Signed)
Caller & Relationship to patient:  MRN #  [id number]   Call Back Number:   Date of Last Office Visit: Visit date not found     Date of Next Office Visit: Visit date not found    Medication(s) to be Refilled: Percocet  Preferred Pharmacy:   ** Please notify patient to allow 48-72 hours to process** **Let patient know to contact pharmacy at the end of the day to make sure medication is ready. ** **If patient has not been seen in a year or longer, book an appointment **Advise to use MyChart for refill requests OR to contact their pharmacy    

## 2022-05-06 NOTE — Telephone Encounter (Signed)
Caller & Relationship to patient:  MRN #  [id number]   Call Back Number:   Date of Last Office Visit: Visit date not found     Date of Next Office Visit: Visit date not found    Medication(s) to be Refilled: Percocet  Preferred Pharmacy:   ** Please notify patient to allow 48-72 hours to process** **Let patient know to contact pharmacy at the end of the day to make sure medication is ready. ** **If patient has not been seen in a year or longer, book an appointment **Advise to use MyChart for refill requests OR to contact their pharmacy

## 2022-05-07 ENCOUNTER — Other Ambulatory Visit: Payer: Self-pay | Admitting: Family Medicine

## 2022-05-07 DIAGNOSIS — D571 Sickle-cell disease without crisis: Secondary | ICD-10-CM

## 2022-05-07 DIAGNOSIS — F1190 Opioid use, unspecified, uncomplicated: Secondary | ICD-10-CM

## 2022-05-07 MED ORDER — OXYCODONE-ACETAMINOPHEN 10-325 MG PO TABS: 1.0000 | ORAL_TABLET | ORAL | 0 refills | Status: DC | PRN

## 2022-05-07 NOTE — Progress Notes (Signed)
Reviewed PDMP substance reporting system prior to prescribing opiate medications. No inconsistencies noted.  Meds ordered this encounter  Medications   oxyCODONE-acetaminophen (PERCOCET) 10-325 MG tablet    Sig: Take 1 tablet by mouth every 4 hours as needed for pain.    Dispense:  90 tablet    Refill:  0    Order Specific Question:   Supervising Provider    Answer:   JEGEDE, OLUGBEMIGA E [[id number]]   Martin [name] Martin Pinedo  APRN, [name], [name] [facility] [address]  [place], [place] [address] [phone]  

## 2022-06-01 ENCOUNTER — Telehealth: Payer: Self-pay | Admitting: Family Medicine

## 2022-06-01 NOTE — Telephone Encounter (Signed)
Oxycodone refill request.

## 2022-06-02 ENCOUNTER — Other Ambulatory Visit: Payer: Self-pay | Admitting: Family Medicine

## 2022-06-02 DIAGNOSIS — D571 Sickle-cell disease without crisis: Secondary | ICD-10-CM

## 2022-06-02 DIAGNOSIS — F1190 Opioid use, unspecified, uncomplicated: Secondary | ICD-10-CM

## 2022-06-02 MED ORDER — OXYCODONE-ACETAMINOPHEN 10-325 MG PO TABS: 1.0000 | ORAL_TABLET | ORAL | 0 refills | Status: DC | PRN

## 2022-06-02 NOTE — Progress Notes (Signed)
Reviewed PDMP substance reporting system prior to prescribing opiate medications. No inconsistencies noted.  Meds ordered this encounter  Medications   oxyCODONE-acetaminophen (PERCOCET) 10-325 MG tablet    Sig: Take 1 tablet by mouth every 4 hours as needed for pain.    Dispense:  90 tablet    Refill:  0    Order Specific Question:   Supervising Provider    Answer:   Tresa Garter [id number]     Donia Pounds  APRN, [name], [name] [facility] [address] [place], [place] [address] [phone]

## 2022-06-15 ENCOUNTER — Telehealth: Payer: Self-pay | Admitting: Family Medicine

## 2022-06-15 NOTE — Telephone Encounter (Signed)
Called requesting his meds be sent to [facility] [address]

## 2022-06-16 ENCOUNTER — Other Ambulatory Visit (HOSPITAL_COMMUNITY): Payer: Self-pay

## 2022-06-17 ENCOUNTER — Other Ambulatory Visit: Payer: Self-pay | Admitting: Family Medicine

## 2022-06-17 ENCOUNTER — Telehealth: Payer: Self-pay | Admitting: Family Medicine

## 2022-06-17 DIAGNOSIS — F1190 Opioid use, unspecified, uncomplicated: Secondary | ICD-10-CM

## 2022-06-17 DIAGNOSIS — D571 Sickle-cell disease without crisis: Secondary | ICD-10-CM

## 2022-06-17 MED ORDER — OXYCODONE-ACETAMINOPHEN 10-325 MG PO TABS: 1.0000 | ORAL_TABLET | ORAL | 0 refills | Status: DC | PRN

## 2022-06-17 NOTE — Telephone Encounter (Signed)
Pt called requesting his oxycodone percocet be refill please be sent to [facility] [address]

## 2022-06-17 NOTE — Progress Notes (Signed)
Reviewed PDMP substance reporting system prior to prescribing opiate medications. No inconsistencies noted.  Meds ordered this encounter  Medications   oxyCODONE-acetaminophen (PERCOCET) 10-325 MG tablet    Sig: Take 1 tablet by mouth every 4 hours as needed for pain.    Dispense:  90 tablet    Refill:  0    Order Specific Question:   Supervising Provider    Answer:   Tresa Garter [id number]   Donia Pounds  APRN, [name], [name] [facility] [address] [place], [place] [address] [phone]

## 2022-06-19 ENCOUNTER — Other Ambulatory Visit: Payer: Self-pay | Admitting: Family Medicine

## 2022-06-19 ENCOUNTER — Telehealth: Payer: Self-pay | Admitting: Family Medicine

## 2022-06-19 DIAGNOSIS — G8929 Other chronic pain: Secondary | ICD-10-CM

## 2022-06-19 DIAGNOSIS — D571 Sickle-cell disease without crisis: Secondary | ICD-10-CM

## 2022-06-19 NOTE — Telephone Encounter (Signed)
Pt called saying the [facility] was out of his medication but the [facility] at [address] [place] [address] has it in stock   Pt states he has been without medication since yesterday

## 2022-06-22 ENCOUNTER — Encounter (HOSPITAL_COMMUNITY): Payer: Self-pay

## 2022-06-22 ENCOUNTER — Other Ambulatory Visit: Payer: Self-pay

## 2022-06-22 ENCOUNTER — Inpatient Hospital Stay (HOSPITAL_COMMUNITY)
Admission: EM | Admit: 2022-06-22 | Discharge: 2022-06-28 | DRG: 812 | Disposition: A | Discharge status: Home / Self Care | Payer: Medicaid Other | Attending: Internal Medicine | Admitting: Internal Medicine

## 2022-06-22 ENCOUNTER — Emergency Department (HOSPITAL_COMMUNITY): Payer: Medicaid Other

## 2022-06-22 DIAGNOSIS — T8089XA Other complications following infusion, transfusion and therapeutic injection, initial encounter: Secondary | ICD-10-CM | POA: Diagnosis present

## 2022-06-22 DIAGNOSIS — F1190 Opioid use, unspecified, uncomplicated: Secondary | ICD-10-CM

## 2022-06-22 DIAGNOSIS — Z86711 Personal history of pulmonary embolism: Secondary | ICD-10-CM | POA: Diagnosis not present

## 2022-06-22 DIAGNOSIS — Z21 Asymptomatic human immunodeficiency virus [HIV] infection status: Secondary | ICD-10-CM | POA: Diagnosis present

## 2022-06-22 DIAGNOSIS — Z7901 Long term (current) use of anticoagulants: Secondary | ICD-10-CM | POA: Diagnosis not present

## 2022-06-22 DIAGNOSIS — Z91048 Other nonmedicinal substance allergy status: Secondary | ICD-10-CM | POA: Diagnosis not present

## 2022-06-22 DIAGNOSIS — B20 Human immunodeficiency virus [HIV] disease: Secondary | ICD-10-CM | POA: Diagnosis present

## 2022-06-22 DIAGNOSIS — Z79899 Other long term (current) drug therapy: Secondary | ICD-10-CM | POA: Diagnosis not present

## 2022-06-22 DIAGNOSIS — M549 Dorsalgia, unspecified: Secondary | ICD-10-CM | POA: Diagnosis present

## 2022-06-22 DIAGNOSIS — F419 Anxiety disorder, unspecified: Secondary | ICD-10-CM | POA: Diagnosis present

## 2022-06-22 DIAGNOSIS — Z79891 Long term (current) use of opiate analgesic: Secondary | ICD-10-CM | POA: Diagnosis not present

## 2022-06-22 DIAGNOSIS — G894 Chronic pain syndrome: Secondary | ICD-10-CM | POA: Diagnosis present

## 2022-06-22 DIAGNOSIS — Z832 Family history of diseases of the blood and blood-forming organs and certain disorders involving the immune mechanism: Secondary | ICD-10-CM

## 2022-06-22 DIAGNOSIS — D571 Sickle-cell disease without crisis: Secondary | ICD-10-CM

## 2022-06-22 DIAGNOSIS — Z888 Allergy status to other drugs, medicaments and biological substances status: Secondary | ICD-10-CM

## 2022-06-22 DIAGNOSIS — D5700 Hb-SS disease with crisis, unspecified: Secondary | ICD-10-CM | POA: Diagnosis present

## 2022-06-22 DIAGNOSIS — D638 Anemia in other chronic diseases classified elsewhere: Secondary | ICD-10-CM | POA: Diagnosis present

## 2022-06-22 DIAGNOSIS — K5900 Constipation, unspecified: Secondary | ICD-10-CM | POA: Diagnosis present

## 2022-06-22 DIAGNOSIS — Z1152 Encounter for screening for COVID-19: Secondary | ICD-10-CM

## 2022-06-22 LAB — CBC WITH DIFFERENTIAL/PLATELET
Abs Immature Granulocytes: 0.07 10*3/uL (ref 0.00–0.07)
Basophils Absolute: 0.1 10*3/uL (ref 0.0–0.1)
Basophils Relative: 1 %
Eosinophils Absolute: 0.2 10*3/uL (ref 0.0–0.5)
Eosinophils Relative: 2 %
HCT: 23.3 % — ABNORMAL LOW (ref 39.0–52.0)
Hemoglobin: 8.4 g/dL — ABNORMAL LOW (ref 13.0–17.0)
Immature Granulocytes: 1 %
Lymphocytes Relative: 41 %
Lymphs Abs: 5.1 10*3/uL — ABNORMAL HIGH (ref 0.7–4.0)
MCH: 36.1 pg — ABNORMAL HIGH (ref 26.0–34.0)
MCHC: 36.1 g/dL — ABNORMAL HIGH (ref 30.0–36.0)
MCV: 100 fL (ref 80.0–100.0)
Monocytes Absolute: 1.3 10*3/uL — ABNORMAL HIGH (ref 0.1–1.0)
Monocytes Relative: 11 %
Neutro Abs: 5.7 10*3/uL (ref 1.7–7.7)
Neutrophils Relative %: 44 %
Platelets: 457 10*3/uL — ABNORMAL HIGH (ref 150–400)
RBC: 2.33 MIL/uL — ABNORMAL LOW (ref 4.22–5.81)
RDW: 28.3 % — ABNORMAL HIGH (ref 11.5–15.5)
WBC: 12.5 10*3/uL — ABNORMAL HIGH (ref 4.0–10.5)
nRBC: 26.7 % — ABNORMAL HIGH (ref 0.0–0.2)

## 2022-06-22 LAB — COMPREHENSIVE METABOLIC PANEL
ALT: 13 U/L (ref 0–44)
AST: 25 U/L (ref 15–41)
Albumin: 4 g/dL (ref 3.5–5.0)
Alkaline Phosphatase: 63 U/L (ref 38–126)
Anion gap: 8 (ref 5–15)
BUN: 9 mg/dL (ref 6–20)
CO2: 23 mmol/L (ref 22–32)
Calcium: 8.7 mg/dL — ABNORMAL LOW (ref 8.9–10.3)
Chloride: 108 mmol/L (ref 98–111)
Creatinine, Ser: 0.74 mg/dL (ref 0.61–1.24)
GFR, Estimated: >60 mL/min (ref >60)
Glucose, Bld: 86 mg/dL (ref 70–99)
Potassium: 3.7 mmol/L (ref 3.5–5.1)
Sodium: 139 mmol/L (ref 135–145)
Total Bilirubin: 10.5 mg/dL — ABNORMAL HIGH (ref 0.3–1.2)
Total Protein: 8.6 g/dL — ABNORMAL HIGH (ref 6.5–8.1)

## 2022-06-22 LAB — RETICULOCYTES
Immature Retic Fract: 42 % — ABNORMAL HIGH (ref 2.3–15.9)
RBC.: 2.3 MIL/uL — ABNORMAL LOW (ref 4.22–5.81)
Retic Count, Absolute: 453.6 10*3/uL — ABNORMAL HIGH (ref 19.0–186.0)
Retic Ct Pct: 19.7 % — ABNORMAL HIGH (ref 0.4–3.1)

## 2022-06-22 MED ORDER — HYDROMORPHONE HCL 2 MG/ML IJ SOLN
2.0000 mg | Freq: Once | INTRAMUSCULAR | Status: AC
  Administered 2022-06-22: 2 mg via INTRAVENOUS
  Filled 2022-06-22: qty 1

## 2022-06-22 MED ORDER — POLYETHYLENE GLYCOL 3350 17 G PO PACK: 17.0000 g | PACK | Freq: Every day | ORAL | Status: DC | PRN

## 2022-06-22 MED ORDER — HYDROXYUREA 500 MG PO CAPS
2000.0000 mg | ORAL_CAPSULE | Freq: Every day | ORAL | Status: DC
  Administered 2022-06-22 – 2022-06-27 (×6): 2000 mg via ORAL
  Filled 2022-06-22 (×6): qty 4

## 2022-06-22 MED ORDER — SODIUM CHLORIDE 0.9 % IV BOLUS
500.0000 mL | Freq: Once | INTRAVENOUS | Status: AC
  Administered 2022-06-22: 500 mL via INTRAVENOUS

## 2022-06-22 MED ORDER — DIPHENHYDRAMINE HCL 25 MG PO CAPS
25.0000 mg | ORAL_CAPSULE | ORAL | Status: DC | PRN
  Administered 2022-06-23: 25 mg via ORAL
  Filled 2022-06-22: qty 1

## 2022-06-22 MED ORDER — SODIUM CHLORIDE 0.45 % IV SOLN: INTRAVENOUS | Status: DC

## 2022-06-22 MED ORDER — ABACAVIR-DOLUTEGRAVIR-LAMIVUD 600-50-300 MG PO TABS
1.0000 | ORAL_TABLET | Freq: Every day | ORAL | Status: DC
  Administered 2022-06-22 – 2022-06-27 (×6): 1 via ORAL
  Filled 2022-06-22 (×7): qty 1

## 2022-06-22 MED ORDER — DIPHENHYDRAMINE HCL 50 MG/ML IJ SOLN
25.0000 mg | Freq: Once | INTRAMUSCULAR | Status: AC
  Administered 2022-06-22: 25 mg via INTRAVENOUS
  Filled 2022-06-22: qty 1

## 2022-06-22 MED ORDER — HYDROMORPHONE 1 MG/ML IV SOLN
INTRAVENOUS | Status: DC
  Administered 2022-06-22 – 2022-06-23 (×2): 30 mg via INTRAVENOUS
  Administered 2022-06-23: 4 mg via INTRAVENOUS
  Administered 2022-06-23: 7 mg via INTRAVENOUS
  Administered 2022-06-23: 4.5 mg via INTRAVENOUS
  Administered 2022-06-23: 4 mg via INTRAVENOUS
  Administered 2022-06-23: 10.5 mg via INTRAVENOUS
  Administered 2022-06-23: 5.5 mg via INTRAVENOUS
  Administered 2022-06-24: 4.5 mg via INTRAVENOUS
  Administered 2022-06-24: 9 mg via INTRAVENOUS
  Administered 2022-06-24: 8 mg via INTRAVENOUS
  Administered 2022-06-24: 2 mg via INTRAVENOUS
  Administered 2022-06-24: 7 mg via INTRAVENOUS
  Administered 2022-06-24: 30 mg via INTRAVENOUS
  Administered 2022-06-24: 5 mg via INTRAVENOUS
  Administered 2022-06-25: 7 mg via INTRAVENOUS
  Administered 2022-06-25: 30 mg via INTRAVENOUS
  Administered 2022-06-25: 5.5 mg via INTRAVENOUS
  Administered 2022-06-25: 6.5 mg via INTRAVENOUS
  Administered 2022-06-25: 5.5 mg via INTRAVENOUS
  Administered 2022-06-25: 9.5 mg via INTRAVENOUS
  Administered 2022-06-25: 6 mg via INTRAVENOUS
  Administered 2022-06-25: 4.5 mg via INTRAVENOUS
  Administered 2022-06-26: 6 mg via INTRAVENOUS
  Administered 2022-06-26: 0.5 mg via INTRAVENOUS
  Administered 2022-06-26: 9.5 mg via INTRAVENOUS
  Administered 2022-06-26: 4 mg via INTRAVENOUS
  Administered 2022-06-26: 3.5 mg via INTRAVENOUS
  Administered 2022-06-26: 30 mg via INTRAVENOUS
  Administered 2022-06-27: 11 mg via INTRAVENOUS
  Administered 2022-06-27: 2 mg via INTRAVENOUS
  Administered 2022-06-27: 30 mg via INTRAVENOUS
  Administered 2022-06-27 (×2): 8 mg via INTRAVENOUS
  Administered 2022-06-27: 30 mg via INTRAVENOUS
  Administered 2022-06-28: 11 mg via INTRAVENOUS
  Administered 2022-06-28: 0.5 mg via INTRAVENOUS
  Administered 2022-06-28: 3.5 mg via INTRAVENOUS
  Administered 2022-06-28: 11 mg via INTRAVENOUS
  Administered 2022-06-28 (×2): 1.5 mg via INTRAVENOUS
  Filled 2022-06-22 (×8): qty 30

## 2022-06-22 MED ORDER — HYDROMORPHONE HCL 1 MG/ML IJ SOLN
1.0000 mg | Freq: Once | INTRAMUSCULAR | Status: AC
  Administered 2022-06-22: 1 mg via INTRAVENOUS
  Filled 2022-06-22: qty 1

## 2022-06-22 MED ORDER — SENNOSIDES-DOCUSATE SODIUM 8.6-50 MG PO TABS
1.0000 | ORAL_TABLET | Freq: Two times a day (BID) | ORAL | Status: DC
  Administered 2022-06-23 – 2022-06-26 (×3): 1 via ORAL
  Filled 2022-06-22 (×10): qty 1

## 2022-06-22 MED ORDER — ONDANSETRON HCL 4 MG/2ML IJ SOLN
4.0000 mg | Freq: Four times a day (QID) | INTRAMUSCULAR | Status: DC | PRN
  Administered 2022-06-23 – 2022-06-27 (×6): 4 mg via INTRAVENOUS
  Filled 2022-06-22 (×7): qty 2

## 2022-06-22 MED ORDER — APIXABAN 5 MG PO TABS
5.0000 mg | ORAL_TABLET | Freq: Two times a day (BID) | ORAL | Status: DC
  Administered 2022-06-22 – 2022-06-28 (×12): 5 mg via ORAL
  Filled 2022-06-22 (×12): qty 1

## 2022-06-22 MED ORDER — SODIUM CHLORIDE 0.9% FLUSH: 9.0000 mL | INTRAVENOUS | Status: DC | PRN

## 2022-06-22 MED ORDER — NALOXONE HCL 0.4 MG/ML IJ SOLN: 0.4000 mg | INTRAMUSCULAR | Status: DC | PRN

## 2022-06-22 NOTE — ED Triage Notes (Signed)
Pt states that he has been hurting "everywhere" x2 days. Pt states that he has tried his home meds with no relief.

## 2022-06-22 NOTE — Assessment & Plan Note (Addendum)
Sickle cell pathway Dilaudid PCA CBC daily No toradol due to allergies Half NS Cont Hydrea

## 2022-06-22 NOTE — Assessment & Plan Note (Signed)
Cont HAART

## 2022-06-22 NOTE — ED Notes (Signed)
Pt O2 80s in triage. Pt placed on 2L O2.

## 2022-06-22 NOTE — ED Provider Notes (Signed)
[place] EMERGENCY DEPARTMENT AT [facility] Provider Note   CSN: [id number] Arrival date & time: 06/22/22  1520     History {Add pertinent medical, surgical, social history, OB history to HPI:1} Chief Complaint  Patient presents with   Sickle Cell Pain Crisis    Martin [name] is a 32 y.o. male.  Patient has a history of sickle cell disease.  He states that he is hurting all over.  This is typical for his sickle cell painful crisis he has   Sickle Cell Pain Crisis      Home Medications Prior to Admission medications   Medication Sig Start Date End Date Taking? Authorizing Provider  abacavir-dolutegravir-lamiVUDine (TRIUMEQ) 600-50-300 MG tablet Take 1 tablet by mouth at bedtime.    [provider]  ALPRAZolam Duanne Moron) 1 MG tablet Take 1 mg by mouth 2 (two) times daily as needed for anxiety. Patient not taking: Reported on 03/31/2022    [provider]  apixaban (ELIQUIS) 5 MG TABS tablet Take 1 tablet (5 mg total) by mouth 2 (two) times daily. 12/02/21   [name], [name]  BANOPHEN 25 MG capsule Take by mouth. 12/10/21   [provider]  cetirizine (ZYRTEC) 10 MG tablet Take 10 mg by mouth daily as needed for allergies.    [provider]  Cyanocobalamin (B-12) 1000 MCG CAPS Take 1 capsule by mouth daily. 11/04/21   [provider]  cyclobenzaprine (FLEXERIL) 10 MG tablet Take 10 mg by mouth every 8 (eight) hours as needed. 09/17/21   [provider]  docusate sodium (COLACE) 100 MG capsule Take 100 mg by mouth 2 (two) times daily. 09/17/21   [provider]  ergocalciferol (VITAMIN D2) 1.25 MG (50000 UT) capsule Take 1 capsule (50,000 Units total) by mouth once a week. 04/19/22   [name], [name]  folic acid (FOLVITE) 1 MG tablet Take 1 mg by mouth daily. 10/10/21   [provider]  gabapentin (NEURONTIN) 100 MG capsule Take 200 mg by mouth every 8 (eight) hours as needed. 11/04/21    [provider]  hydroxyurea (HYDREA) 500 MG capsule Take 4 capsules (2,000 mg total) by mouth at bedtime. 10/07/20   [name], [name]  lamivudine (EPIVIR) 300 MG tablet Take 300 mg by mouth daily. 11/24/21   [provider]  melatonin 3 MG TABS tablet Take 6 mg by mouth at bedtime as needed (sleep). 03/04/21   [provider]  naloxone [facility]) nasal spray 4 mg/0.1 mL Place 4 mg into the nose as needed for opioid reversal. Patient not taking: Reported on 03/31/2022 05/19/17   [provider]  oxyCODONE (OXY IR/ROXICODONE) 5 MG immediate release tablet Take 5 mg by mouth every 4 (four) hours as needed. Patient not taking: Reported on 03/31/2022 01/16/22   [provider]  oxyCODONE-acetaminophen (PERCOCET) 10-325 MG tablet Take 1 tablet by mouth every 4 hours as needed for pain. 06/17/22   [name], [name]  TIVICAY 50 MG tablet Take 50 mg by mouth daily. 11/24/21   [provider]      Allergies    Ketorolac tromethamine, Tape, and Wound dressing adhesive    Review of Systems   Review of Systems  Physical Exam Updated Vital Signs BP 117/70 (BP Location: Right Arm)   Pulse 78   Temp 98.5 F (36.9 C) (Oral)   Resp 18   Ht '6\' 3"'$  (1.905 m)   Wt 63.5 kg   SpO2  91%   BMI 17.50 kg/m  Physical Exam  ED Results / Procedures / Treatments   Labs (all labs ordered are listed, but only abnormal results are displayed) Labs Reviewed  CBC WITH DIFFERENTIAL/PLATELET - Abnormal; Notable for the following components:      Result Value   WBC 12.5 (*)    RBC 2.33 (*)    Hemoglobin 8.4 (*)    HCT 23.3 (*)    MCH 36.1 (*)    MCHC 36.1 (*)    RDW 28.3 (*)    Platelets 457 (*)    nRBC 26.7 (*)    All other components within normal limits  COMPREHENSIVE METABOLIC PANEL - Abnormal; Notable for the following components:   Calcium 8.7 (*)    Total Protein 8.6 (*)    Total Bilirubin 10.5 (*)    All other components within normal limits   RETICULOCYTES - Abnormal; Notable for the following components:   Retic Ct Pct 19.7 (*)    RBC. 2.30 (*)    Retic Count, Absolute 453.6 (*)    Immature Retic Fract 42.0 (*)    All other components within normal limits  CBC    EKG None  Radiology DG Chest Port 1 View  Result Date: 06/22/2022 CLINICAL DATA:  Pt states that he has been hurting "everywhere" x2 days. Pt states that he has tried his home meds with no relief. EXAM: PORTABLE CHEST - 1 VIEW COMPARISON:  09/01/2021 FINDINGS: Stable right IJ port catheter. Coarse perihilar and bibasilar interstitial opacities as before. No new infiltrate or overt edema. Heart size and mediastinal contours are within normal limits. No effusion. Visualized bones unremarkable. IMPRESSION: No acute cardiopulmonary disease. Electronically Signed   By: Lucrezia Europe M.D.   On: 06/22/2022 16:28    Procedures Procedures  {Document cardiac monitor, telemetry assessment procedure when appropriate:1}  Medications Ordered in ED Medications  apixaban (ELIQUIS) tablet 5 mg (has no administration in time range)  senna-docusate (Senokot-S) tablet 1 tablet (has no administration in time range)  polyethylene glycol (MIRALAX / GLYCOLAX) packet 17 g (has no administration in time range)  naloxone (NARCAN) injection 0.4 mg (has no administration in time range)    And  sodium chloride flush (NS) 0.9 % injection 9 mL (has no administration in time range)  ondansetron (ZOFRAN) injection 4 mg (has no administration in time range)  diphenhydrAMINE (BENADRYL) capsule 25 mg (has no administration in time range)  HYDROmorphone (DILAUDID) 1 mg/mL PCA injection (has no administration in time range)  0.45 % sodium chloride infusion (has no administration in time range)  HYDROmorphone (DILAUDID) injection 1 mg (1 mg Intravenous Given 06/22/22 1659)  diphenhydrAMINE (BENADRYL) injection 25 mg (25 mg Intravenous Given 06/22/22 1658)  sodium chloride 0.9 % bolus 500 mL (0 mLs  Intravenous Stopped 06/22/22 1742)  HYDROmorphone (DILAUDID) injection 1 mg (1 mg Intravenous Given 06/22/22 1743)  HYDROmorphone (DILAUDID) injection 2 mg (2 mg Intravenous Given 06/22/22 1849)    ED Course/ Medical Decision Making/ A&P   {   Click here for ABCD2, HEART and other calculatorsREFRESH Note before signing :1}                          Medical Decision Making Amount and/or Complexity of Data Reviewed Labs: ordered. Radiology: ordered.  Risk Prescription drug management. Decision regarding hospitalization.   Patient with sickle cell painful crisis.  He will be admitted to medicine for symptomatic relief sickle cell pain  crisis  {Document critical care time when appropriate:1} {Document review of labs and clinical decision tools ie heart score, Chads2Vasc2 etc:1}  {Document your independent review of radiology images, and any outside records:1} {Document your discussion with family members, caretakers, and with consultants:1} {Document social determinants of health affecting pt's care:1} {Document your decision making why or why not admission, treatments were needed:1} Final Clinical Impression(s) / ED Diagnoses Final diagnoses:  Sickle cell pain crisis ([place])    Rx / DC Orders ED Discharge Orders     None

## 2022-06-22 NOTE — Assessment & Plan Note (Addendum)
Ongoing High ferritin's iron saturations at end of last year. Has fairly severe hyperbilirubinemia though, so I'm guessing that's a contraindication to Exjade? Defer management to day sickle cell team.

## 2022-06-22 NOTE — H&P (Signed)
History and Physical    Patient: Martin [name] [id number] DOB: 1990/10/10 DOA: 06/22/2022 DOS: the patient was seen and examined on 06/22/2022 PCP: [name], [name]  Patient coming from: Home  Chief Complaint:  Chief Complaint  Patient presents with   Sickle Cell Pain Crisis   HPI: Martin [name] is a 32 y.o. male with medical history significant of Sickle cell disease, chronic opiate use, HIV on HAART.  Pt presents to ED with c/o pain all over.  This is typical of his sickle cell pain crises.  Symptoms onset 2 days ago.  No relief with home meds.  Symptoms, persistent, constant.   Review of Systems: As mentioned in the history of present illness. All other systems reviewed and are negative. Past Medical History:  Diagnosis Date   Anxiety    HIV (human immunodeficiency virus infection) ([place])    Proteinuria    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[place])    Vitamin D deficiency 10/2018   Past Surgical History:  Procedure Laterality Date   IR IMAGING GUIDED PORT INSERTION  08/29/2019   IR REMOVAL TUN ACCESS W/ PORT W/O FL MOD SED  08/30/2020   TEE WITHOUT CARDIOVERSION N/A 08/30/2020   Procedure: TRANSESOPHAGEAL ECHOCARDIOGRAM (TEE);  Surgeon: Pixie Casino, MD;  Location: [facility] ENDOSCOPY;  Service: Cardiovascular;  Laterality: N/A;   Social History:  reports that he has never smoked. He has never used smokeless tobacco. He reports current drug use. Drug: Marijuana. He reports that he does not drink alcohol.  Allergies  Allergen Reactions   Ketorolac Tromethamine Swelling and Other (See Comments)    Patient reports facial edema and left arm edema after administration.   Tape Rash and Other (See Comments)    PLEASE DO NOT USE THE CLEAR, THICK, "PLASTIC" TAPE- only paper tape is tolerated    Wound Dressing Adhesive Rash    Family History  Problem Relation Age of Onset   Sickle cell trait Mother    Sickle cell trait Father    Birth defects Maternal Grandmother    Birth  defects Paternal Grandmother     Prior to Admission medications   Medication Sig Start Date End Date Taking? Authorizing Provider  abacavir-dolutegravir-lamiVUDine (TRIUMEQ) 600-50-300 MG tablet Take 1 tablet by mouth at bedtime.    [provider]  ALPRAZolam Duanne Moron) 1 MG tablet Take 1 mg by mouth 2 (two) times daily as needed for anxiety. Patient not taking: Reported on 03/31/2022    [provider]  apixaban (ELIQUIS) 5 MG TABS tablet Take 1 tablet (5 mg total) by mouth 2 (two) times daily. 12/02/21   [name], [name]  BANOPHEN 25 MG capsule Take by mouth. 12/10/21   [provider]  cetirizine (ZYRTEC) 10 MG tablet Take 10 mg by mouth daily as needed for allergies.    [provider]  Cyanocobalamin (B-12) 1000 MCG CAPS Take 1 capsule by mouth daily. 11/04/21   [provider]  cyclobenzaprine (FLEXERIL) 10 MG tablet Take 10 mg by mouth every 8 (eight) hours as needed. 09/17/21   [provider]  docusate sodium (COLACE) 100 MG capsule Take 100 mg by mouth 2 (two) times daily. 09/17/21   [provider]  ergocalciferol (VITAMIN D2) 1.25 MG (50000 UT) capsule Take 1 capsule (50,000 Units total) by mouth once a week. 04/19/22   [name], [name]  folic acid (FOLVITE) 1 MG tablet Take 1 mg by mouth daily. 10/10/21   [provider]  gabapentin (  NEURONTIN) 100 MG capsule Take 200 mg by mouth every 8 (eight) hours as needed. 11/04/21   [provider]  hydroxyurea (HYDREA) 500 MG capsule Take 4 capsules (2,000 mg total) by mouth at bedtime. 10/07/20   [name], [name]  lamivudine (EPIVIR) 300 MG tablet Take 300 mg by mouth daily. 11/24/21   [provider]  melatonin 3 MG TABS tablet Take 6 mg by mouth at bedtime as needed (sleep). 03/04/21   [provider]  naloxone [facility]) nasal spray 4 mg/0.1 mL Place 4 mg into the nose as needed for opioid reversal. Patient not taking: Reported on  03/31/2022 05/19/17   [provider]  oxyCODONE (OXY IR/ROXICODONE) 5 MG immediate release tablet Take 5 mg by mouth every 4 (four) hours as needed. Patient not taking: Reported on 03/31/2022 01/16/22   [provider]  oxyCODONE-acetaminophen (PERCOCET) 10-325 MG tablet Take 1 tablet by mouth every 4 hours as needed for pain. 06/17/22   [name], [name]  TIVICAY 50 MG tablet Take 50 mg by mouth daily. 11/24/21   [provider]    Physical Exam: Vitals:   06/22/22 2030 06/22/22 2045 06/22/22 2100 06/22/22 2115  BP: 117/70  (!) 107/90   Pulse: 78 79 81 82  Resp: 18     Temp:      TempSrc:      SpO2: 91% 90% 91% 90%  Weight:      Height:       Constitutional: NAD, calm, comfortable Respiratory: clear to auscultation bilaterally, no wheezing, no crackles. Normal respiratory effort. No accessory muscle use.  Cardiovascular: Regular rate and rhythm, no murmurs / rubs / gallops. No extremity edema. 2+ pedal pulses. No carotid bruits.  Abdomen: no tenderness, no masses palpated. No hepatosplenomegaly. Bowel sounds positive.  Neurologic: CN 2-12 grossly intact. Sensation intact, DTR normal. Strength 5/5 in all 4.  Psychiatric: Normal judgment and insight. Alert and oriented x 3. Normal mood.   Data Reviewed:       Latest Ref Rng & Units 06/22/2022    5:05 PM 03/31/2022   12:28 PM 02/20/2022    6:08 AM  CBC  WBC 4.0 - 10.5 K/uL 12.5  10.6  10.2   Hemoglobin 13.0 - 17.0 g/dL 8.4  9.5  8.1   Hematocrit 39.0 - 52.0 % 23.3  26.1  22.1   Platelets 150 - 400 K/uL 457  318  397       Latest Ref Rng & Units 06/22/2022    5:05 PM 03/31/2022   12:28 PM 02/19/2022   12:50 PM  CMP  Glucose 70 - 99 mg/dL 86  100  84   BUN 6 - 20 mg/dL '9  10  10   '$ Creatinine 0.61 - 1.24 mg/dL 0.74  1.01  0.88   Sodium 135 - 145 mmol/L 139  141  140   Potassium 3.5 - 5.1 mmol/L 3.7  5.8  4.2   Chloride 98 - 111 mmol/L 108  107  111   CO2 22 - 32 mmol/L '23  17  24   '$ Calcium 8.9 -  10.3 mg/dL 8.7  9.2  8.9   Total Protein 6.5 - 8.1 g/dL 8.6  9.1  8.8   Total Bilirubin 0.3 - 1.2 mg/dL 10.5  16.0  9.1   Alkaline Phos 38 - 126 U/L 63  81  62   AST 15 - 41 U/L 25  50  23   ALT 0 -  44 U/L '13  13  13    '$ CXR = no acute findings  Assessment and Plan: * Sickle cell disease with crisis ([place]) Sickle cell pathway Dilaudid PCA CBC daily No toradol due to allergies Half NS Cont Hydrea  Human immunodeficiency virus (HIV) disease (H[facility]) Cont HAART  History of pulmonary embolus (PE) Cont eliquis  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 Has fairly severe hyperbilirubinemia though, so I'm guessing that's a contraindication to Exjade? Defer management to day sickle cell team.      Advance Care Planning:   Code Status: Full Code  Consults: None  Family Communication: No family in room  Severity of Illness: The appropriate patient status for this patient is INPATIENT. Inpatient status is judged to be reasonable and necessary in order to provide the required intensity of service to ensure the patient's safety. The patient's presenting symptoms, physical exam findings, and initial radiographic and laboratory data in the context of their chronic comorbidities is felt to place them at high risk for further clinical deterioration. Furthermore, it is not anticipated that the patient will be medically stable for discharge from the hospital within 2 midnights of admission.   * I certify that at the point of admission it is my clinical judgment that the patient will require inpatient hospital care spanning beyond 2 midnights from the point of admission due to high intensity of service, high risk for further deterioration and high frequency of surveillance required.*  Author: Etta Quill., DO 06/22/2022 9:28 PM  For on call review [email or web address].

## 2022-06-22 NOTE — Assessment & Plan Note (Signed)
Cont eliquis

## 2022-06-23 DIAGNOSIS — D5700 Hb-SS disease with crisis, unspecified: Secondary | ICD-10-CM

## 2022-06-23 LAB — CBC
HCT: 21.9 % — ABNORMAL LOW (ref 39.0–52.0)
Hemoglobin: 8.1 g/dL — ABNORMAL LOW (ref 13.0–17.0)
MCH: 37.2 pg — ABNORMAL HIGH (ref 26.0–34.0)
MCHC: 37 g/dL — ABNORMAL HIGH (ref 30.0–36.0)
MCV: 100.5 fL — ABNORMAL HIGH (ref 80.0–100.0)
Platelets: 491 10*3/uL — ABNORMAL HIGH (ref 150–400)
RBC: 2.18 MIL/uL — ABNORMAL LOW (ref 4.22–5.81)
RDW: 27.5 % — ABNORMAL HIGH (ref 11.5–15.5)
WBC: 13.1 10*3/uL — ABNORMAL HIGH (ref 4.0–10.5)
nRBC: 24.1 % — ABNORMAL HIGH (ref 0.0–0.2)

## 2022-06-23 MED ORDER — CHLORHEXIDINE GLUCONATE CLOTH 2 % EX PADS
6.0000 | MEDICATED_PAD | Freq: Every day | CUTANEOUS | Status: DC
  Administered 2022-06-23 – 2022-06-27 (×5): 6 via TOPICAL

## 2022-06-23 NOTE — Plan of Care (Signed)
  Problem: Sensory: Goal: Pain level will decrease with appropriate interventions Outcome: Progressing   Problem: Activity: Goal: Risk for activity intolerance will decrease Outcome: Progressing   Problem: Pain Managment: Goal: General experience of comfort will improve Outcome: Progressing   Problem: Safety: Goal: Ability to remain free from injury will improve Outcome: Progressing   Problem: Skin Integrity: Goal: Risk for impaired skin integrity will decrease Outcome: Progressing

## 2022-06-23 NOTE — Progress Notes (Signed)
Subjective: Martin [name] is a 32 year old male with a medical history significant for sickle cell disease, history of HIV, history of present PE on Eliquis, and chronic pain syndrome was admitted for sickle cell pain crisis.   Patient is complaining of all over body pain. He rates pain as 9/10, constant and aching. Pain persists despite IV dilaudid PCA.   Patient denies shortness of breath, headache, chest pain, dizziness, nausea, vomiting, or diarrhea.   Objective:  Vital signs in last 24 hours:  Vitals:   06/23/22 0010 06/23/22 0437 06/23/22 0447 06/23/22 0756  BP:  96/71    Pulse:  70    Resp: '16 18 15 15  '$ Temp:  97.7 F (36.5 C)    TempSrc:  Oral    SpO2: 94% 90% 93% 92%  Weight:      Height:        Intake/Output from previous day:   Intake/Output Summary (Last 24 hours) at 06/23/2022 0836 Last data filed at 06/23/2022 0757 Gross per 24 hour  Intake 1730.14 ml  Output 650 ml  Net 1080.14 ml    Physical Exam: General: Alert, awake, oriented x3, in no acute distress.  HEENT: [place]/AT PEERL, EOMI Neck: Trachea midline,  no masses, no thyromegal,y no JVD, no carotid bruit OROPHARYNX:  Moist, No exudate/ erythema/lesions.  Heart: Regular rate and rhythm, without murmurs, rubs, gallops, PMI non-displaced, no heaves or thrills on palpation.  Lungs: Clear to auscultation, no wheezing or rhonchi noted. No increased vocal fremitus resonant to percussion  Abdomen: Soft, nontender, nondistended, positive bowel sounds, no masses no hepatosplenomegaly noted..  Neuro: No focal neurological deficits noted cranial nerves II through XII grossly intact. DTRs 2+ bilaterally upper and lower extremities. Strength 5 out of 5 in bilateral upper and lower extremities. Musculoskeletal: No warm swelling or erythema around joints, no spinal tenderness noted. Psychiatric: Patient alert and oriented x3, good insight and cognition, good recent to remote recall. Lymph node survey: No cervical  axillary or inguinal lymphadenopathy noted.  Lab Results:  Basic Metabolic Panel:    Component Value Date/Time   NA 139 06/22/2022 1705   NA 141 03/31/2022 1228   K 3.7 06/22/2022 1705   CL 108 06/22/2022 1705   CO2 23 06/22/2022 1705   BUN 9 06/22/2022 1705   BUN 10 03/31/2022 1228   CREATININE 0.74 06/22/2022 1705   GLUCOSE 86 06/22/2022 1705   CALCIUM 8.7 (L) 06/22/2022 1705   CBC:    Component Value Date/Time   WBC 13.1 (H) 06/23/2022 0457   HGB 8.1 (L) 06/23/2022 0457   HGB 9.5 (L) 03/31/2022 1228   HCT 21.9 (L) 06/23/2022 0457   HCT 26.1 (L) 03/31/2022 1228   PLT 491 (H) 06/23/2022 0457   PLT 318 03/31/2022 1228   MCV 100.5 (H) 06/23/2022 0457   MCV 107 (H) 03/31/2022 1228   NEUTROABS 5.7 06/22/2022 1705   NEUTROABS 3.5 03/31/2022 1228   LYMPHSABS 5.1 (H) 06/22/2022 1705   LYMPHSABS 6.1 (H) 03/31/2022 1228   MONOABS 1.3 (H) 06/22/2022 1705   EOSABS 0.2 06/22/2022 1705   EOSABS 0.1 03/31/2022 1228   BASOSABS 0.1 06/22/2022 1705   BASOSABS 0.1 03/31/2022 1228    No results found for this or any previous visit (from the past 240 hour(s)).  Studies/Results: DG Chest Port 1 View  Result Date: 06/22/2022 CLINICAL DATA:  Pt states that he has been hurting "everywhere" x2 days. Pt states that he has tried his home meds with no relief.  EXAM: PORTABLE CHEST - 1 VIEW COMPARISON:  09/01/2021 FINDINGS: Stable right IJ port catheter. Coarse perihilar and bibasilar interstitial opacities as before. No new infiltrate or overt edema. Heart size and mediastinal contours are within normal limits. No effusion. Visualized bones unremarkable. IMPRESSION: No acute cardiopulmonary disease. Electronically Signed   By: Lucrezia Europe M.D.   On: 06/22/2022 16:28    Medications: Scheduled Meds:  abacavir-dolutegravir-lamiVUDine  1 tablet Oral QHS   apixaban  5 mg Oral BID   Chlorhexidine Gluconate Cloth  6 each Topical Daily   HYDROmorphone   Intravenous Q4H   hydroxyurea  2,000 mg Oral  QHS   senna-docusate  1 tablet Oral BID   Continuous Infusions:  sodium chloride 100 mL/hr at 06/23/22 0759   PRN Meds:.diphenhydrAMINE, naloxone **AND** sodium chloride flush, ondansetron (ZOFRAN) IV, polyethylene glycol  Consultants: none  Procedures: none  Antibiotics: none  Assessment/Plan: Principal Problem:   Sickle cell disease with crisis ([place]) Active Problems:   Human immunodeficiency virus (HIV) disease ([place])   Transfusion hemosiderosis   History of pulmonary embolus (PE)   Sickle cell disease with pain crisis:  Continue IV dilaudid PCA, no changes Hold toradol due to allergy Will restart percocet as pain intensity improves.  Decrease IV fluids to [facility] Monitor vital signs closely, reevaluate pain scale regularly, and supplemental oxygen  Chronic pain syndrome:  Hold percocet, will restart as pain intensity improves  Anemia of chronic disease:  Hemoglobin is stable and consistent with his baseline. There is no clinical indication for blood transition at this time. Labs in am  History of HIV: Continue current therapy. He is followed by ID as an outpatient  History of PE:  Continue apixaban     Code Status: Full Code Family Communication: N/A Disposition Plan: Not yet ready for discharge  [place], [name], [name] Patient [place], [place] [address] [phone]  If 7PM-7AM, please contact night-coverage.  06/23/2022, 8:36 AM  LOS: 1 day

## 2022-06-23 NOTE — Progress Notes (Incomplete)
Subjective: ***  Objective:  Vital signs in last 24 hours:  Vitals:   06/23/22 0437 06/23/22 0447 06/23/22 0756 06/23/22 0951  BP: 96/71   98/61  Pulse: 70   76  Resp: '18 15 15 18  '$ Temp: 97.7 F (36.5 C)   97.6 F (36.4 C)  TempSrc: Oral   Oral  SpO2: 90% 93% 92% (!) 89%  Weight:      Height:        Intake/Output from previous day:   Intake/Output Summary (Last 24 hours) at 06/23/2022 1245 Last data filed at 06/23/2022 1045 Gross per 24 hour  Intake 2090.14 ml  Output 1050 ml  Net 1040.14 ml    Physical Exam: General: Alert, awake, oriented x3, in no acute distress.  HEENT: [place]/AT PEERL, EOMI Neck: Trachea midline,  no masses, no thyromegal,y no JVD, no carotid bruit OROPHARYNX:  Moist, No exudate/ erythema/lesions.  Heart: Regular rate and rhythm, without murmurs, rubs, gallops, PMI non-displaced, no heaves or thrills on palpation.  Lungs: Clear to auscultation, no wheezing or rhonchi noted. No increased vocal fremitus resonant to percussion  Abdomen: Soft, nontender, nondistended, positive bowel sounds, no masses no hepatosplenomegaly noted..  Neuro: No focal neurological deficits noted cranial nerves II through XII grossly intact. DTRs 2+ bilaterally upper and lower extremities. Strength 5 out of 5 in bilateral upper and lower extremities. Musculoskeletal: No warm swelling or erythema around joints, no spinal tenderness noted. Psychiatric: Patient alert and oriented x3, good insight and cognition, good recent to remote recall. Lymph node survey: No cervical axillary or inguinal lymphadenopathy noted.  Lab Results:  Basic Metabolic Panel:    Component Value Date/Time   NA 139 06/22/2022 1705   NA 141 03/31/2022 1228   K 3.7 06/22/2022 1705   CL 108 06/22/2022 1705   CO2 23 06/22/2022 1705   BUN 9 06/22/2022 1705   BUN 10 03/31/2022 1228   CREATININE 0.74 06/22/2022 1705   GLUCOSE 86 06/22/2022 1705   CALCIUM 8.7 (L) 06/22/2022 1705   CBC:    Component Value  Date/Time   WBC 13.1 (H) 06/23/2022 0457   HGB 8.1 (L) 06/23/2022 0457   HGB 9.5 (L) 03/31/2022 1228   HCT 21.9 (L) 06/23/2022 0457   HCT 26.1 (L) 03/31/2022 1228   PLT 491 (H) 06/23/2022 0457   PLT 318 03/31/2022 1228   MCV 100.5 (H) 06/23/2022 0457   MCV 107 (H) 03/31/2022 1228   NEUTROABS 5.7 06/22/2022 1705   NEUTROABS 3.5 03/31/2022 1228   LYMPHSABS 5.1 (H) 06/22/2022 1705   LYMPHSABS 6.1 (H) 03/31/2022 1228   MONOABS 1.3 (H) 06/22/2022 1705   EOSABS 0.2 06/22/2022 1705   EOSABS 0.1 03/31/2022 1228   BASOSABS 0.1 06/22/2022 1705   BASOSABS 0.1 03/31/2022 1228    No results found for this or any previous visit (from the past 240 hour(s)).  Studies/Results: DG Chest Port 1 View  Result Date: 06/22/2022 CLINICAL DATA:  Pt states that he has been hurting "everywhere" x2 days. Pt states that he has tried his home meds with no relief. EXAM: PORTABLE CHEST - 1 VIEW COMPARISON:  09/01/2021 FINDINGS: Stable right IJ port catheter. Coarse perihilar and bibasilar interstitial opacities as before. No new infiltrate or overt edema. Heart size and mediastinal contours are within normal limits. No effusion. Visualized bones unremarkable. IMPRESSION: No acute cardiopulmonary disease. Electronically Signed   By: Lucrezia Europe M.D.   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 Leukocytosis:  Anemia of Chronic Disease:  Chronic pain Syndrome:  Medication non-compliance  Code Status: Full Code Family Communication: N/A Disposition Plan: Not yet ready for discharge  Cammie Sickle  If 7PM-7AM, please contact night-coverage.  06/23/2022, 12:45 PM  LOS: 1 day

## 2022-06-23 NOTE — Care Plan (Signed)
This is sickle cell patient admitted by hospitalist overnight.  Should go to sickle cell team.  Discussed with Dr Doreene Burke. Transferring care

## 2022-06-23 NOTE — TOC CM/SW Note (Signed)
  Transition of Care (TOC) Screening Note   Patient Details  Name: Martin [name] Date of Birth: 1990-11-03   Transition of Care [facility]) CM/SW Contact:    Lennart Pall, LCSW Phone Number: 06/23/2022, 9:44 AM    Transition of Care Department [facility]) has reviewed patient and no TOC needs have been identified at this time. We will continue to monitor patient advancement through interdisciplinary progression rounds. If new patient transition needs arise, please place a TOC consult.

## 2022-06-24 LAB — CBC
HCT: 20.8 % — ABNORMAL LOW (ref 39.0–52.0)
Hemoglobin: 7.8 g/dL — ABNORMAL LOW (ref 13.0–17.0)
MCH: 36.6 pg — ABNORMAL HIGH (ref 26.0–34.0)
MCHC: 37.5 g/dL — ABNORMAL HIGH (ref 30.0–36.0)
MCV: 97.7 fL (ref 80.0–100.0)
Platelets: 407 10*3/uL — ABNORMAL HIGH (ref 150–400)
RBC: 2.13 MIL/uL — ABNORMAL LOW (ref 4.22–5.81)
RDW: 25.4 % — ABNORMAL HIGH (ref 11.5–15.5)
WBC: 10.1 10*3/uL (ref 4.0–10.5)
nRBC: 19.9 % — ABNORMAL HIGH (ref 0.0–0.2)

## 2022-06-24 MED ORDER — LIP MEDEX EX OINT
TOPICAL_OINTMENT | CUTANEOUS | Status: DC | PRN
  Administered 2022-06-26: 75 via TOPICAL
  Administered 2022-06-27 – 2022-06-28 (×2): 1 via TOPICAL
  Filled 2022-06-24 (×5): qty 7

## 2022-06-24 NOTE — Plan of Care (Signed)
  Problem: Self-Care: Goal: Ability to incorporate actions that prevent/reduce pain crisis will improve Outcome: Progressing   Problem: Bowel/Gastric: Goal: Gut motility will be maintained Outcome: Progressing   Problem: Tissue Perfusion: Goal: Complications related to inadequate tissue perfusion will be avoided or minimized Outcome: Progressing   Problem: Sensory: Goal: Pain level will decrease with appropriate interventions Outcome: Progressing   Problem: Education: Goal: Knowledge of General Education information will improve Description: Including pain rating scale, medication(s)/side effects and non-pharmacologic comfort measures Outcome: Progressing   Problem: Clinical Measurements: Goal: Ability to maintain clinical measurements within normal limits will improve Outcome: Progressing Goal: Will remain free from infection Outcome: Progressing Goal: Diagnostic test results will improve Outcome: Progressing Goal: Respiratory complications will improve Outcome: Progressing Goal: Cardiovascular complication will be avoided Outcome: Progressing   Problem: Activity: Goal: Risk for activity intolerance will decrease Outcome: Progressing   Problem: Coping: Goal: Level of anxiety will decrease Outcome: Progressing   Problem: Pain Managment: Goal: General experience of comfort will improve Outcome: Progressing   Problem: Safety: Goal: Ability to remain free from injury will improve Outcome: Progressing

## 2022-06-24 NOTE — Progress Notes (Signed)
Subjective: Martin [name] is a 32 year old male with a medical history significant for sickle cell disease, history of HIV, history of present PE on Eliquis, and chronic pain syndrome was admitted for sickle cell pain crisis.   Patient is complaining of all over body pain. He rates pain as 8/10, constant and aching. Pain persists despite IV dilaudid PCA.   Patient denies shortness of breath, headache, chest pain, dizziness, nausea, vomiting, or diarrhea.   Objective:  Vital signs in last 24 hours:  Vitals:   06/24/22 0552 06/24/22 0742 06/24/22 1012 06/24/22 1125  BP: 94/60  104/67   Pulse: 74  77   Resp: 17 14 18 16   Temp: 97.9 F (36.6 C)  98.1 F (36.7 C)   TempSrc: Oral  Oral   SpO2: 93% 92% (!) 83% 90%  Weight:      Height:        Intake/Output from previous day:   Intake/Output Summary (Last 24 hours) at 06/24/2022 1229 Last data filed at 06/24/2022 1130 Gross per 24 hour  Intake 3098.73 ml  Output 2450 ml  Net 648.73 ml    Physical Exam: General: Alert, awake, oriented x3, in no acute distress.  HEENT: [place]/AT [place]ERL, EOMI Neck: Trachea midline,  no masses, no thyromegal,y no JVD, no carotid bruit OROPHARYNX:  Moist, No exudate/ erythema/lesions.  Heart: Regular rate and rhythm, without murmurs, rubs, gallops, PMI non-displaced, no heaves or thrills on palpation.  Lungs: Clear to auscultation, no wheezing or rhonchi noted. No increased vocal fremitus resonant to percussion  Abdomen: Soft, nontender, nondistended, positive bowel sounds, no masses no hepatosplenomegaly noted..  Neuro: No focal neurological deficits noted cranial nerves II through XII grossly intact. DTRs 2+ bilaterally upper and lower extremities. Strength 5 out of 5 in bilateral upper and lower extremities. Musculoskeletal: No warm swelling or erythema around joints, no spinal tenderness noted. Psychiatric: Patient alert and oriented x3, good insight and cognition, good recent to remote recall. Lymph  node survey: No cervical axillary or inguinal lymphadenopathy noted.  Lab Results:  Basic Metabolic Panel:    Component Value Date/Time   NA 139 06/22/2022 1705   NA 141 03/31/2022 1228   K 3.7 06/22/2022 1705   CL 108 06/22/2022 1705   CO2 23 06/22/2022 1705   BUN 9 06/22/2022 1705   BUN 10 03/31/2022 1228   CREATININE 0.74 06/22/2022 1705   GLUCOSE 86 06/22/2022 1705   CALCIUM 8.7 (L) 06/22/2022 1705   CBC:    Component Value Date/Time   WBC 10.1 06/24/2022 0456   HGB 7.8 (L) 06/24/2022 0456   HGB 9.5 (L) 03/31/2022 1228   HCT 20.8 (L) 06/24/2022 0456   HCT 26.1 (L) 03/31/2022 1228   PLT 407 (H) 06/24/2022 0456   PLT 318 03/31/2022 1228   MCV 97.7 06/24/2022 0456   MCV 107 (H) 03/31/2022 1228   NEUTROABS 5.7 06/22/2022 1705   NEUTROABS 3.5 03/31/2022 1228   LYMPHSABS 5.1 (H) 06/22/2022 1705   LYMPHSABS 6.1 (H) 03/31/2022 1228   MONOABS 1.3 (H) 06/22/2022 1705   EOSABS 0.2 06/22/2022 1705   EOSABS 0.1 03/31/2022 1228   BASOSABS 0.1 06/22/2022 1705   BASOSABS 0.1 03/31/2022 1228    No results found for this or any previous visit (from the past 240 hour(s)).  Studies/Results: DG Chest Port 1 View  Result Date: 06/22/2022 CLINICAL DATA:  Pt states that he has been hurting "everywhere" x2 days. Pt states that he has tried his home meds with  no relief. EXAM: PORTABLE CHEST - 1 VIEW COMPARISON:  09/01/2021 FINDINGS: Stable right IJ port catheter. Coarse perihilar and bibasilar interstitial opacities as before. No new infiltrate or overt edema. Heart size and mediastinal contours are within normal limits. No effusion. Visualized bones unremarkable. IMPRESSION: No acute cardiopulmonary disease. Electronically Signed   By: Lucrezia Europe M.D.   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 There is no clinical indication for blood transition at this time. Labs in am  History of HIV: Continue current therapy. He is followed by ID as an outpatient  History of PE:  Continue apixaban     Code Status: Full Code Family Communication: N/A Disposition Plan: Not yet ready for discharge  [place], [name], [name] Patient [address] [place], [place] [address] [phone]  If 7PM-7AM, please contact night-coverage.  06/24/2022, 12:29 PM  LOS: 2 days

## 2022-06-25 DIAGNOSIS — D5700 Hb-SS disease with crisis, unspecified: Secondary | ICD-10-CM | POA: Diagnosis not present

## 2022-06-25 LAB — CBC
HCT: 18.6 % — ABNORMAL LOW (ref 39.0–52.0)
Hemoglobin: 7.1 g/dL — ABNORMAL LOW (ref 13.0–17.0)
MCH: 36.6 pg — ABNORMAL HIGH (ref 26.0–34.0)
MCHC: 38.2 g/dL — ABNORMAL HIGH (ref 30.0–36.0)
MCV: 95.9 fL (ref 80.0–100.0)
Platelets: 478 10*3/uL — ABNORMAL HIGH (ref 150–400)
RBC: 1.94 MIL/uL — ABNORMAL LOW (ref 4.22–5.81)
RDW: 24.5 % — ABNORMAL HIGH (ref 11.5–15.5)
WBC: 11.1 10*3/uL — ABNORMAL HIGH (ref 4.0–10.5)
nRBC: 0.3 % — ABNORMAL HIGH (ref 0.0–0.2)

## 2022-06-25 MED ORDER — FLEET ENEMA 7-19 GM/118ML RE ENEM
1.0000 | ENEMA | Freq: Once | RECTAL | Status: AC
  Administered 2022-06-25: 1 via RECTAL
  Filled 2022-06-25: qty 1

## 2022-06-25 MED ORDER — LACTULOSE 10 GM/15ML PO SOLN: 30.0000 g | Freq: Once | ORAL | Status: DC

## 2022-06-25 NOTE — Progress Notes (Signed)
Upon resolving infusion verify for intake and output at end of shift. 1/2NS IVF showed multiple rate changes from yesterday through tonight documented in the events. Rate is to run at 33m/hr. It appears patient has been tampering with the rate, increasing it to as fast as 9831mhr for a minute or so and then decreasing it back to ordered rate of 1031mr. Channel is locked to prevent further tampering. Will monitor closely.

## 2022-06-25 NOTE — Progress Notes (Signed)
Patient ID: Martin [name], male   DOB: 1990-10-11, 32 y.o.   MRN: [id number] Subjective: Martin [name] is a 32 year old male with a medical history significant for sickle cell disease, history of HIV, history of present PE on Eliquis, and chronic pain syndrome was admitted for sickle cell pain crisis.   Patient claims he is slightly better today but still in significant pain.  He also complained of constipation and requesting for Fleet enema.  He has no new complaint today.  He denies any fever, cough, chest pain, shortness of breath, nausea, vomiting or diarrhea.  Objective:  Vital signs in last 24 hours:  Vitals:   06/25/22 0620 06/25/22 0823 06/25/22 1006 06/25/22 1214  BP: 113/69  100/62   Pulse: 88  77   Resp: 17 14 20 14   Temp: 98.2 F (36.8 C)  98 F (36.7 C)   TempSrc: Oral  Oral   SpO2: 94% 91% 93% 92%  Weight:      Height:        Intake/Output from previous day:   Intake/Output Summary (Last 24 hours) at 06/25/2022 1307 Last data filed at 06/25/2022 1010 Gross per 24 hour  Intake 240 ml  Output 3025 ml  Net -2785 ml    Physical Exam: General: Alert, awake, oriented x3, in no acute distress.  HEENT: [place]/AT PEERL, EOMI Neck: Trachea midline,  no masses, no thyromegal,y no JVD, no carotid bruit OROPHARYNX:  Moist, No exudate/ erythema/lesions.  Heart: Regular rate and rhythm, without murmurs, rubs, gallops, PMI non-displaced, no heaves or thrills on palpation.  Lungs: Clear to auscultation, no wheezing or rhonchi noted. No increased vocal fremitus resonant to percussion  Abdomen: Soft, nontender, nondistended, positive bowel sounds, no masses no hepatosplenomegaly noted..  Neuro: No focal neurological deficits noted cranial nerves II through XII grossly intact. DTRs 2+ bilaterally upper and lower extremities. Strength 5 out of 5 in bilateral upper and lower extremities. Musculoskeletal: No warm swelling or erythema around joints, no spinal tenderness  noted. Psychiatric: Patient alert and oriented x3, good insight and cognition, good recent to remote recall. Lymph node survey: No cervical axillary or inguinal lymphadenopathy noted.  Lab Results:  Basic Metabolic Panel:    Component Value Date/Time   NA 139 06/22/2022 1705   NA 141 03/31/2022 1228   K 3.7 06/22/2022 1705   CL 108 06/22/2022 1705   CO2 23 06/22/2022 1705   BUN 9 06/22/2022 1705   BUN 10 03/31/2022 1228   CREATININE 0.74 06/22/2022 1705   GLUCOSE 86 06/22/2022 1705   CALCIUM 8.7 (L) 06/22/2022 1705   CBC:    Component Value Date/Time   WBC 11.1 (H) 06/25/2022 0444   HGB 7.1 (L) 06/25/2022 0444   HGB 9.5 (L) 03/31/2022 1228   HCT 18.6 (L) 06/25/2022 0444   HCT 26.1 (L) 03/31/2022 1228   PLT 478 (H) 06/25/2022 0444   PLT 318 03/31/2022 1228   MCV 95.9 06/25/2022 0444   MCV 107 (H) 03/31/2022 1228   NEUTROABS 5.7 06/22/2022 1705   NEUTROABS 3.5 03/31/2022 1228   LYMPHSABS 5.1 (H) 06/22/2022 1705   LYMPHSABS 6.1 (H) 03/31/2022 1228   MONOABS 1.3 (H) 06/22/2022 1705   EOSABS 0.2 06/22/2022 1705   EOSABS 0.1 03/31/2022 1228   BASOSABS 0.1 06/22/2022 1705   BASOSABS 0.1 03/31/2022 1228    No results found for this or any previous visit (from the past 240 hour(s)).  Studies/Results: No results found.  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  Continue weight based Dilaudid PCA at current dose setting, IV Toradol is contraindicated due to allergy, continue oral home pain medications as ordered.  Monitor vitals very closely, Re-evaluate pain scale regularly, 2 L of Oxygen by [place]. Anemia of Chronic Disease: Hemoglobin remained stable at baseline today.  There is no clinical indication for blood transfusion at this time.  Will continue to monitor closely and transfuse as appropriate. Chronic pain Syndrome: Restart and continue home pain medications. History of HIV disease: Clinically stable.  Continue current management per ID. History of pulmonary embolism: Continue apixaban.  Code Status: Full Code Family Communication: N/A Disposition Plan: Not yet ready for discharge  Martin [name]  If 7PM-7AM, please contact night-coverage.  06/25/2022, 1:07 PM  LOS: 3 days

## 2022-06-26 DIAGNOSIS — D5700 Hb-SS disease with crisis, unspecified: Secondary | ICD-10-CM | POA: Diagnosis not present

## 2022-06-26 LAB — COMPREHENSIVE METABOLIC PANEL
ALT: 11 U/L (ref 0–44)
AST: 29 U/L (ref 15–41)
Albumin: 4.2 g/dL (ref 3.5–5.0)
Alkaline Phosphatase: 55 U/L (ref 38–126)
Anion gap: 11 (ref 5–15)
BUN: 13 mg/dL (ref 6–20)
CO2: 27 mmol/L (ref 22–32)
Calcium: 9.1 mg/dL (ref 8.9–10.3)
Chloride: 97 mmol/L — ABNORMAL LOW (ref 98–111)
Creatinine, Ser: 0.77 mg/dL (ref 0.61–1.24)
GFR, Estimated: >60 mL/min (ref >60)
Glucose, Bld: 97 mg/dL (ref 70–99)
Potassium: 4 mmol/L (ref 3.5–5.1)
Sodium: 135 mmol/L (ref 135–145)
Total Bilirubin: 8.6 mg/dL — ABNORMAL HIGH (ref 0.3–1.2)
Total Protein: 8.7 g/dL — ABNORMAL HIGH (ref 6.5–8.1)

## 2022-06-26 LAB — CBC
HCT: 19.1 % — ABNORMAL LOW (ref 39.0–52.0)
Hemoglobin: 7.3 g/dL — ABNORMAL LOW (ref 13.0–17.0)
MCH: 35.8 pg — ABNORMAL HIGH (ref 26.0–34.0)
MCHC: 38.2 g/dL — ABNORMAL HIGH (ref 30.0–36.0)
MCV: 93.6 fL (ref 80.0–100.0)
Platelets: 408 10*3/uL — ABNORMAL HIGH (ref 150–400)
RBC: 2.04 MIL/uL — ABNORMAL LOW (ref 4.22–5.81)
RDW: 24.6 % — ABNORMAL HIGH (ref 11.5–15.5)
WBC: 10.7 10*3/uL — ABNORMAL HIGH (ref 4.0–10.5)
nRBC: 5.8 % — ABNORMAL HIGH (ref 0.0–0.2)

## 2022-06-26 MED ORDER — BISACODYL 10 MG RE SUPP
10.0000 mg | Freq: Every day | RECTAL | Status: DC | PRN
  Administered 2022-06-26: 10 mg via RECTAL
  Filled 2022-06-26 (×2): qty 1

## 2022-06-26 NOTE — Plan of Care (Signed)
Patient AOX4, VSS throughout shift.  All meds given on time as ordered.  Pt denied pain and SOB.  Diminished lungs, IS encouraged.  Pt voided in urinal.  PCA Dilaudid on-going with continuous half NS at 71m/hr.  POC maintained, will continue to monitor.  Problem: Education: Goal: Knowledge of vaso-occlusive preventative measures will improve Outcome: Progressing Goal: Awareness of infection prevention will improve Outcome: Progressing Goal: Awareness of signs and symptoms of anemia will improve Outcome: Progressing Goal: Long-term complications will improve Outcome: Progressing   Problem: Self-Care: Goal: Ability to incorporate actions that prevent/reduce pain crisis will improve Outcome: Progressing   Problem: Bowel/Gastric: Goal: Gut motility will be maintained Outcome: Progressing   Problem: Tissue Perfusion: Goal: Complications related to inadequate tissue perfusion will be avoided or minimized Outcome: Progressing   Problem: Respiratory: Goal: Pulmonary complications will be avoided or minimized Outcome: Progressing Goal: Acute Chest Syndrome will be identified early to prevent complications Outcome: Progressing   Problem: Fluid Volume: Goal: Ability to maintain a balanced intake and output will improve Outcome: Progressing   Problem: Sensory: Goal: Pain level will decrease with appropriate interventions Outcome: Progressing   Problem: Health Behavior: Goal: Postive changes in compliance with treatment and prescription regimens will improve Outcome: Progressing   Problem: Education: Goal: Knowledge of General Education information will improve Description: Including pain rating scale, medication(s)/side effects and non-pharmacologic comfort measures Outcome: Progressing   Problem: Health Behavior/Discharge Planning: Goal: Ability to manage health-related needs will improve Outcome: Progressing   Problem: Clinical Measurements: Goal: Ability to maintain  clinical measurements within normal limits will improve Outcome: Progressing Goal: Will remain free from infection Outcome: Progressing Goal: Diagnostic test results will improve Outcome: Progressing Goal: Respiratory complications will improve Outcome: Progressing Goal: Cardiovascular complication will be avoided Outcome: Progressing   Problem: Activity: Goal: Risk for activity intolerance will decrease Outcome: Progressing   Problem: Nutrition: Goal: Adequate nutrition will be maintained Outcome: Progressing   Problem: Coping: Goal: Level of anxiety will decrease Outcome: Progressing   Problem: Elimination: Goal: Will not experience complications related to bowel motility Outcome: Progressing Goal: Will not experience complications related to urinary retention Outcome: Progressing   Problem: Pain Managment: Goal: General experience of comfort will improve Outcome: Progressing   Problem: Safety: Goal: Ability to remain free from injury will improve Outcome: Progressing   Problem: Skin Integrity: Goal: Risk for impaired skin integrity will decrease Outcome: Progressing

## 2022-06-26 NOTE — Progress Notes (Signed)
Patient ID: Martin [name], male   DOB: 1990-12-26, 32 y.o.   MRN: [id number] Subjective: Martin [name] is a 32 year old male with a medical history significant for sickle cell disease, history of HIV, history of present PE on Eliquis, and chronic pain syndrome was admitted for sickle cell pain crisis.   Today patient has no new complaint.  He said his pain is much improved, he rates his pain as 6/10 as against a goal of 3/10.  He has no fever.  He denies any cough, chest pain, shortness of breath, nausea, vomiting or diarrhea.  No urinary symptoms.  Objective:  Vital signs in last 24 hours:  Vitals:   06/26/22 0800 06/26/22 0952 06/26/22 1141 06/26/22 1357  BP:  112/70  113/73  Pulse:  74  81  Resp: '15 20 14 20  '$ Temp:  98.6 F (37 C)  98.1 F (36.7 C)  TempSrc:  Oral  Oral  SpO2: 94% 94% 94% 91%  Weight:      Height:        Intake/Output from previous day:   Intake/Output Summary (Last 24 hours) at 06/26/2022 1744 Last data filed at 06/26/2022 1400 Gross per 24 hour  Intake 527.66 ml  Output 1925 ml  Net -1397.34 ml     Physical Exam: General: Alert, awake, oriented x3, in no acute distress.  HEENT: [place]/AT PEERL, EOMI Neck: Trachea midline,  no masses, no thyromegal,y no JVD, no carotid bruit OROPHARYNX:  Moist, No exudate/ erythema/lesions.  Heart: Regular rate and rhythm, without murmurs, rubs, gallops, PMI non-displaced, no heaves or thrills on palpation.  Lungs: Clear to auscultation, no wheezing or rhonchi noted. No increased vocal fremitus resonant to percussion  Abdomen: Soft, nontender, nondistended, positive bowel sounds, no masses no hepatosplenomegaly noted..  Neuro: No focal neurological deficits noted cranial nerves II through XII grossly intact. DTRs 2+ bilaterally upper and lower extremities. Strength 5 out of 5 in bilateral upper and lower extremities. Musculoskeletal: No warm swelling or erythema around joints, no spinal tenderness noted. Psychiatric:  Patient alert and oriented x3, good insight and cognition, good recent to remote recall. Lymph node survey: No cervical axillary or inguinal lymphadenopathy noted.  Lab Results:  Basic Metabolic Panel:    Component Value Date/Time   NA 135 06/26/2022 0637   NA 141 03/31/2022 1228   K 4.0 06/26/2022 0637   CL 97 (L) 06/26/2022 0637   CO2 27 06/26/2022 0637   BUN 13 06/26/2022 0637   BUN 10 03/31/2022 1228   CREATININE 0.77 06/26/2022 0637   GLUCOSE 97 06/26/2022 0637   CALCIUM 9.1 06/26/2022 0637   CBC:    Component Value Date/Time   WBC 10.7 (H) 06/26/2022 0637   HGB 7.3 (L) 06/26/2022 0637   HGB 9.5 (L) 03/31/2022 1228   HCT 19.1 (L) 06/26/2022 0637   HCT 26.1 (L) 03/31/2022 1228   PLT 408 (H) 06/26/2022 0637   PLT 318 03/31/2022 1228   MCV 93.6 06/26/2022 0637   MCV 107 (H) 03/31/2022 1228   NEUTROABS 5.7 06/22/2022 1705   NEUTROABS 3.5 03/31/2022 1228   LYMPHSABS 5.1 (H) 06/22/2022 1705   LYMPHSABS 6.1 (H) 03/31/2022 1228   MONOABS 1.3 (H) 06/22/2022 1705   EOSABS 0.2 06/22/2022 1705   EOSABS 0.1 03/31/2022 1228   BASOSABS 0.1 06/22/2022 1705   BASOSABS 0.1 03/31/2022 1228    No results found for this or any previous visit (from the past 240 hour(s)).  Studies/Results: No results found.  Medications: Scheduled Meds:  abacavir-dolutegravir-lamiVUDine  1 tablet Oral QHS   apixaban  5 mg Oral BID   Chlorhexidine Gluconate Cloth  6 each Topical Daily   HYDROmorphone   Intravenous Q4H   hydroxyurea  2,000 mg Oral QHS   lactulose  30 g Oral Once   senna-docusate  1 tablet Oral BID   Continuous Infusions:  sodium chloride 10 mL/hr at 06/25/22 2008   PRN Meds:.bisacodyl, diphenhydrAMINE, lip balm, naloxone **AND** sodium chloride flush, ondansetron (ZOFRAN) IV, polyethylene glycol  Consultants: None  Procedures: None  Antibiotics: None  Assessment/Plan: Principal Problem:   Sickle cell disease with crisis ([place]) Active Problems:   Human  immunodeficiency virus (HIV) disease ([place])   Transfusion hemosiderosis   History of pulmonary embolus (PE)  Hb Sickle Cell Disease with Pain crisis: Continue IV fluid at [facility].  Continue weight based Dilaudid PCA at current dose setting, IV Toradol is contraindicated due to allergy, continue oral home pain medications as ordered.  Monitor vitals very closely, Re-evaluate pain scale regularly, 2 L of Oxygen by [place].  Patient encouraged to ambulate in the hallway today.  Anticipate discharge tomorrow morning 06/27/2022 Anemia of Chronic Disease: Hemoglobin continues to be stable at baseline.  There is no clinical indication for blood transfusion at this time.  Will continue to monitor closely and transfuse as appropriate. Chronic pain Syndrome: continue home pain medications as ordered History of HIV disease: Clinically stable. Continue current management per ID. History of pulmonary embolism: Continue apixaban.  Code Status: Full Code Family Communication: N/A Disposition Plan: Not yet ready for discharge  Martin [name]  If 7PM-7AM, please contact night-coverage.  06/26/2022, 5:44 PM  LOS: 4 days

## 2022-06-27 LAB — CBC
HCT: 19.1 % — ABNORMAL LOW (ref 39.0–52.0)
Hemoglobin: 7.2 g/dL — ABNORMAL LOW (ref 13.0–17.0)
MCH: 36.2 pg — ABNORMAL HIGH (ref 26.0–34.0)
MCHC: 37.7 g/dL — ABNORMAL HIGH (ref 30.0–36.0)
MCV: 96 fL (ref 80.0–100.0)
Platelets: 481 10*3/uL — ABNORMAL HIGH (ref 150–400)
RBC: 1.99 MIL/uL — ABNORMAL LOW (ref 4.22–5.81)
RDW: 24.9 % — ABNORMAL HIGH (ref 11.5–15.5)
WBC: 13.9 10*3/uL — ABNORMAL HIGH (ref 4.0–10.5)
nRBC: 8.5 % — ABNORMAL HIGH (ref 0.0–0.2)

## 2022-06-27 MED ORDER — MELATONIN 5 MG PO TABS
5.0000 mg | ORAL_TABLET | Freq: Every evening | ORAL | Status: DC | PRN
  Administered 2022-06-27: 5 mg via ORAL
  Filled 2022-06-27: qty 1

## 2022-06-27 NOTE — Progress Notes (Signed)
SICKLE CELL SERVICE PROGRESS NOTE  Martin [name] [id number] DOB: 08-03-1990 DOA: 06/22/2022 PCP: [name], [name]  Assessment/Plan: Principal Problem:   Sickle cell disease with crisis ([place]) Active Problems:   Human immunodeficiency virus (HIV) disease ([place])   Transfusion hemosiderosis   History of pulmonary embolus (PE)  Sickle cell pain crisis: Patient on weight-based Dilaudid PCA.  Unable to get Toradol due to allergy.  On oral medications.  Also on 2 L of oxygen.  Patient has IV fluids KVO.  Slowly improving but pain is still a 6 out of 10.  No fever or chills no nausea vomiting or diarrhea.  Will continue to reassess pain. Anemia of chronic disease: H&H is stable at baseline.  Continue to monitor History of pulmonary embolism: Continue Eliquis. History of HIV disease: Continue home regimen. Chronic pain syndrome: Continue home regimen  Code Status: Full code Family Communication: No family at bedside Disposition Plan: [place]  Pager [phone]. If 7PM-7AM, please contact night-coverage.  06/27/2022, 9:32 AM  LOS: 5 days   Brief narrative: Martin [name] is a 32 year old male with a medical history significant for sickle cell disease, history of HIV, history of present PE on Eliquis, and chronic pain syndrome was admitted for sickle cell pain crisis.    Consultants: None  Procedures: Chest x-ray  Antibiotics: None  HPI/Subjective: Patient is doing much better.  Pain is 7 out of 10.  Said to be improving but has had some mild worsening today.  Continue to have some improvement.  Objective: Vitals:   06/27/22 0136 06/27/22 0313 06/27/22 0537 06/27/22 0731  BP: 118/80  101/70   Pulse: 89  84   Resp: '16 16 14 16  '$ Temp: 98.2 F (36.8 C)  98.1 F (36.7 C)   TempSrc: Oral  Oral   SpO2: 91% 92% 90% 90%  Weight:      Height:       Weight change:   Intake/Output Summary (Last 24 hours) at 06/27/2022 0932 Last data filed at 06/27/2022  0500 Gross per 24 hour  Intake 240 ml  Output 1675 ml  Net -1435 ml    General: Alert, awake, oriented x3, in no acute distress.  HEENT: [place]/AT PEERL, EOMI Neck: Trachea midline,  no masses, no thyromegal,y no JVD, no carotid bruit OROPHARYNX:  Moist, No exudate/ erythema/lesions.  Heart: Regular rate and rhythm, without murmurs, rubs, gallops, PMI non-displaced, no heaves or thrills on palpation.  Lungs: Clear to auscultation, no wheezing or rhonchi noted. No increased vocal fremitus resonant to percussion  Abdomen: Soft, nontender, nondistended, positive bowel sounds, no masses no hepatosplenomegaly noted..  Neuro: No focal neurological deficits noted cranial nerves II through XII grossly intact. DTRs 2+ bilaterally upper and lower extremities. Strength 5 out of 5 in bilateral upper and lower extremities. Musculoskeletal: No warm swelling or erythema around joints, no spinal tenderness noted. Psychiatric: Patient alert and oriented x3, good insight and cognition, good recent to remote recall. Lymph node survey: No cervical axillary or inguinal lymphadenopathy noted.   Data Reviewed: Basic Metabolic Panel: Recent Labs  Lab 06/22/22 1705 06/26/22 0637  NA 139 135  K 3.7 4.0  CL 108 97*  CO2 23 27  GLUCOSE 86 97  BUN 9 13  CREATININE 0.74 0.77  CALCIUM 8.7* 9.1   Liver Function Tests: Recent Labs  Lab 06/22/22 1705 06/26/22 0637  AST 25 29  ALT 13 11  ALKPHOS 63 55  BILITOT 10.5* 8.6*  PROT  8.6* 8.7*  ALBUMIN 4.0 4.2   No results for input(s): "LIPASE", "AMYLASE" in the last 168 hours. No results for input(s): "AMMONIA" in the last 168 hours. CBC: Recent Labs  Lab 06/22/22 1705 06/23/22 0457 06/24/22 0456 06/25/22 0444 06/26/22 0637 06/27/22 0524  WBC 12.5* 13.1* 10.1 11.1* 10.7* 13.9*  NEUTROABS 5.7  --   --   --   --   --   HGB 8.4* 8.1* 7.8* 7.1* 7.3* 7.2*  HCT 23.3* 21.9* 20.8* 18.6* 19.1* 19.1*  MCV 100.0 100.5* 97.7 95.9 93.6 96.0  PLT 457* 491* 407*  478* 408* 481*   Cardiac Enzymes: No results for input(s): "CKTOTAL", "CKMB", "CKMBINDEX", "TROPONINI" in the last 168 hours. BNP (last 3 results) No results for input(s): "BNP" in the last 8760 hours.  ProBNP (last 3 results) No results for input(s): "PROBNP" in the last 8760 hours.  CBG: No results for input(s): "GLUCAP" in the last 168 hours.  No results found for this or any previous visit (from the past 240 hour(s)).   Studies: DG Chest Port 1 View  Result Date: 06/22/2022 CLINICAL DATA:  Pt states that he has been hurting "everywhere" x2 days. Pt states that he has tried his home meds with no relief. EXAM: PORTABLE CHEST - 1 VIEW COMPARISON:  09/01/2021 FINDINGS: Stable right IJ port catheter. Coarse perihilar and bibasilar interstitial opacities as before. No new infiltrate or overt edema. Heart size and mediastinal contours are within normal limits. No effusion. Visualized bones unremarkable. IMPRESSION: No acute cardiopulmonary disease. Electronically Signed   By: Lucrezia Europe M.D.   On: 06/22/2022 16:28    Scheduled Meds:  abacavir-dolutegravir-lamiVUDine  1 tablet Oral QHS   apixaban  5 mg Oral BID   Chlorhexidine Gluconate Cloth  6 each Topical Daily   HYDROmorphone   Intravenous Q4H   hydroxyurea  2,000 mg Oral QHS   senna-docusate  1 tablet Oral BID   Continuous Infusions:  sodium chloride 10 mL/hr at 06/25/22 2008    Principal Problem:   Sickle cell disease with crisis [facility]) Active Problems:   Human immunodeficiency virus (HIV) disease ([place])   Transfusion hemosiderosis   History of pulmonary embolus (PE)

## 2022-06-28 ENCOUNTER — Emergency Department (HOSPITAL_COMMUNITY): Payer: Medicaid Other

## 2022-06-28 ENCOUNTER — Emergency Department (HOSPITAL_COMMUNITY)
Admission: EM | Admit: 2022-06-28 | Discharge: 2022-06-29 | Discharge status: Against Medical Advice | Payer: Medicaid Other | Attending: Emergency Medicine | Admitting: Emergency Medicine

## 2022-06-28 ENCOUNTER — Other Ambulatory Visit: Payer: Self-pay

## 2022-06-28 DIAGNOSIS — D5700 Hb-SS disease with crisis, unspecified: Secondary | ICD-10-CM | POA: Insufficient documentation

## 2022-06-28 LAB — COMPREHENSIVE METABOLIC PANEL
ALT: 12 U/L (ref 0–44)
AST: 34 U/L (ref 15–41)
Albumin: 3.7 g/dL (ref 3.5–5.0)
Alkaline Phosphatase: 51 U/L (ref 38–126)
Anion gap: 15 (ref 5–15)
BUN: 13 mg/dL (ref 6–20)
CO2: 21 mmol/L — ABNORMAL LOW (ref 22–32)
Calcium: 8.9 mg/dL (ref 8.9–10.3)
Chloride: 100 mmol/L (ref 98–111)
Creatinine, Ser: 1.11 mg/dL (ref 0.61–1.24)
GFR, Estimated: >60 mL/min (ref >60)
Glucose, Bld: 101 mg/dL — ABNORMAL HIGH (ref 70–99)
Potassium: 4 mmol/L (ref 3.5–5.1)
Sodium: 136 mmol/L (ref 135–145)
Total Bilirubin: 7.1 mg/dL — ABNORMAL HIGH (ref 0.3–1.2)
Total Protein: 8 g/dL (ref 6.5–8.1)

## 2022-06-28 MED ORDER — SODIUM CHLORIDE 0.45 % IV SOLN: INTRAVENOUS | Status: DC

## 2022-06-28 MED ORDER — OXYCODONE-ACETAMINOPHEN 10-325 MG PO TABS: 1.0000 | ORAL_TABLET | ORAL | 0 refills | Status: DC | PRN

## 2022-06-28 MED ORDER — HEPARIN SOD (PORK) LOCK FLUSH 100 UNIT/ML IV SOLN
500.0000 [IU] | Freq: Once | INTRAVENOUS | Status: AC
  Administered 2022-06-28: 500 [IU]

## 2022-06-28 MED ORDER — ONDANSETRON 4 MG PO TBDP
4.0000 mg | ORAL_TABLET | Freq: Once | ORAL | Status: AC
  Administered 2022-06-28: 4 mg via ORAL
  Filled 2022-06-28: qty 1

## 2022-06-28 MED ORDER — HYDROMORPHONE HCL 1 MG/ML IJ SOLN
2.0000 mg | INTRAMUSCULAR | Status: AC
  Administered 2022-06-28: 2 mg via INTRAVENOUS
  Filled 2022-06-28: qty 2

## 2022-06-28 MED ORDER — HEPARIN SOD (PORK) LOCK FLUSH 100 UNIT/ML IV SOLN
500.0000 [IU] | Freq: Once | INTRAVENOUS | Status: DC
  Filled 2022-06-28: qty 5

## 2022-06-28 MED ORDER — DIPHENHYDRAMINE HCL 25 MG PO CAPS
25.0000 mg | ORAL_CAPSULE | ORAL | Status: DC | PRN
  Administered 2022-06-28: 25 mg via ORAL
  Filled 2022-06-28: qty 1

## 2022-06-28 MED ORDER — SODIUM CHLORIDE 0.9% FLUSH: 10.0000 mL | INTRAVENOUS | Status: DC | PRN

## 2022-06-28 MED ORDER — SODIUM CHLORIDE 0.9% FLUSH
10.0000 mL | Freq: Two times a day (BID) | INTRAVENOUS | Status: DC
  Administered 2022-06-28: 10 mL

## 2022-06-28 NOTE — ED Notes (Addendum)
Pt stated they are on 3 to 4 lt of O2 at home. Pt did not bring his O2 with him. Pt was sating in the 59s, currently on 4 LT of O2.

## 2022-06-28 NOTE — Discharge Summary (Signed)
Physician Discharge Summary   Patient: Martin [name] MRN: [id number] DOB: 06/27/1990  Admit date:     06/22/2022  Discharge date: {dischdate:26783}  Discharge Physician: Barbette Merino   PCP: [name], [name]   Recommendations at discharge:  {Tip this will not be part of the note when signed- Example include specific recommendations for outpatient follow-up, pending tests to follow-up on. (Optional):26781}  ***  Discharge Diagnoses: Principal Problem:   Sickle cell disease with crisis ([place]) Active Problems:   Human immunodeficiency virus (HIV) disease ([place])   Transfusion hemosiderosis   History of pulmonary embolus (PE)  Resolved Problems:   Acute sickle cell crisis [facility])  Hospital Course: No notes on file  Assessment and Plan: * Sickle cell disease with crisis ([place]) Sickle cell pathway Dilaudid PCA CBC daily No toradol due to allergies Half NS Cont Hydrea  Human immunodeficiency virus (HIV) disease (H[facility]) Cont HAART  History of pulmonary embolus (PE) Cont eliquis  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 Has fairly severe hyperbilirubinemia though, so I'm guessing that's a contraindication to Exjade? Defer management to day sickle cell team.      {Tip this will not be part of the note when signed Body mass index is 17.5 kg/m. , ,  (Optional):26781}  {(NOTE) Pain control PDMP Statment (Optional):26782} Consultants: *** Procedures performed: ***  Disposition: {Plan; Disposition:26390} Diet recommendation:  Discharge Diet Orders (From admission, onward)     Start     Ordered   06/28/22 0000  Diet - low sodium heart healthy        06/28/22 1207           {Diet_Plan:26776} DISCHARGE MEDICATION: Allergies as of 06/28/2022       Reactions   Ketorolac Tromethamine Swelling, Other (See Comments)   Patient reports facial edema and left arm edema after administration.   Tape Rash, Other (See  Comments)   PLEASE DO NOT USE THE CLEAR, THICK, "PLASTIC" TAPE- only paper tape is tolerated    Wound Dressing Adhesive Rash        Medication List     STOP taking these medications    ergocalciferol 1.25 MG (50000 UT) capsule Commonly known as: VITAMIN D2       TAKE these medications    Eliquis 5 MG Tabs tablet Generic drug: apixaban Take 1 tablet (5 mg total) by mouth 2 (two) times daily.   folic acid 1 MG tablet Commonly known as: FOLVITE Take 1 mg by mouth daily.   gabapentin 100 MG capsule Commonly known as: NEURONTIN Take 200 mg by mouth every 8 (eight) hours as needed (pain).   hydroxyurea 500 MG capsule Commonly known as: HYDREA Take 4 capsules (2,000 mg total) by mouth at bedtime.   Oxycodone HCl 10 MG Tabs Take 1-2 tablets by mouth every 6 (six) hours as needed (pain).   oxyCODONE-acetaminophen 10-325 MG tablet Commonly known as: Percocet Take 1 tablet by mouth every 4 hours as needed for pain.   Triumeq 600-50-300 MG tablet Generic drug: abacavir-dolutegravir-lamiVUDine Take 1 tablet by mouth at bedtime.        Discharge Exam: Filed Weights   06/22/22 1524  Weight: 63.5 kg   ***  Condition at discharge: {DC Condition:26389}  The results of significant diagnostics from this hospitalization (including imaging, microbiology, ancillary and laboratory) are listed below for reference.   Imaging Studies: DG Chest Port 1 View  Result Date: 06/22/2022 CLINICAL DATA:  Pt states that he has  been hurting "everywhere" x2 days. Pt states that he has tried his home meds with no relief. EXAM: PORTABLE CHEST - 1 VIEW COMPARISON:  09/01/2021 FINDINGS: Stable right IJ port catheter. Coarse perihilar and bibasilar interstitial opacities as before. No new infiltrate or overt edema. Heart size and mediastinal contours are within normal limits. No effusion. Visualized bones unremarkable. IMPRESSION: No acute cardiopulmonary disease. Electronically Signed   By: Lucrezia Europe M.D.   On: 06/22/2022 16:28    Microbiology: Results for orders placed or performed during the hospital encounter of 08/11/21  Resp Panel by RT-PCR (Flu A&B, Covid) Nasopharyngeal Swab     Status: None   Collection Time: 08/11/21  6:20 AM   Specimen: Nasopharyngeal Swab; Nasopharyngeal(NP) swabs in vial transport medium  Result Value Ref Range Status   SARS Coronavirus 2 by RT PCR NEGATIVE NEGATIVE Final    Comment: (NOTE) SARS-CoV-2 target nucleic acids are NOT DETECTED.  The SARS-CoV-2 RNA is generally detectable in upper respiratory specimens during the acute phase of infection. The lowest concentration of SARS-CoV-2 viral copies this assay can detect is 138 copies/mL. A negative result does not preclude SARS-Cov-2 infection and should not be used as the sole basis for treatment or other patient management decisions. A negative result may occur with  improper specimen collection/handling, submission of specimen other than nasopharyngeal swab, presence of viral mutation(s) within the areas targeted by this assay, and inadequate number of viral copies(<138 copies/mL). A negative result must be combined with clinical observations, patient history, and epidemiological information. The expected result is Negative.  Fact Sheet for Patients:  EntrepreneurPulse.com.au  Fact Sheet for Healthcare Providers:  IncredibleEmployment.be  This test is no t yet approved or cleared by the [place] FDA and  has been authorized for detection and/or diagnosis of SARS-CoV-2 by FDA under an Emergency Use Authorization (EUA). This EUA will remain  in effect (meaning this test can be used) for the duration of the COVID-19 declaration under Section 564(b)(1) of the Act, 21 U.S.C.section 360bbb-3(b)(1), unless the authorization is terminated  or revoked sooner.       Influenza A by PCR NEGATIVE NEGATIVE Final   Influenza B by PCR NEGATIVE NEGATIVE  Final    Comment: (NOTE) The Xpert Xpress SARS-CoV-2/FLU/RSV plus assay is intended as an aid in the diagnosis of influenza from Nasopharyngeal swab specimens and should not be used as a sole basis for treatment. Nasal washings and aspirates are unacceptable for Xpert Xpress SARS-CoV-2/FLU/RSV testing.  Fact Sheet for Patients: EntrepreneurPulse.com.au  Fact Sheet for Healthcare Providers: IncredibleEmployment.be  This test is not yet approved or cleared by the [place] FDA and has been authorized for detection and/or diagnosis of SARS-CoV-2 by FDA under an Emergency Use Authorization (EUA). This EUA will remain in effect (meaning this test can be used) for the duration of the COVID-19 declaration under Section 564(b)(1) of the Act, 21 U.S.C. section 360bbb-3(b)(1), unless the authorization is terminated or revoked.  Performed at [facility], [place] [address] [place], [place] [address]     Labs: CBC: Recent Labs  Lab 06/22/22 1705 06/23/22 0457 06/24/22 0456 06/25/22 0444 06/26/22 0637 06/27/22 0524  WBC 12.5* 13.1* 10.1 11.1* 10.7* 13.9*  NEUTROABS 5.7  --   --   --   --   --   HGB 8.4* 8.1* 7.8* 7.1* 7.3* 7.2*  HCT 23.3* 21.9* 20.8* 18.6* 19.1* 19.1*  MCV 100.0 100.5* 97.7 95.9 93.6 96.0  PLT 457* 491* 407* 478* 408*  [id number]*   Basic Metabolic Panel: Recent Labs  Lab 06/22/22 1705 06/26/22 0637  NA 139 135  K 3.7 4.0  CL 108 97*  CO2 23 27  GLUCOSE 86 97  BUN 9 13  CREATININE 0.74 0.77  CALCIUM 8.7* 9.1   Liver Function Tests: Recent Labs  Lab 06/22/22 1705 06/26/22 0637  AST 25 29  ALT 13 11  ALKPHOS 63 55  BILITOT 10.5* 8.6*  PROT 8.6* 8.7*  ALBUMIN 4.0 4.2   CBG: No results for input(s): "GLUCAP" in the last 168 hours.  Discharge time spent: {LESS THAN/GREATER DI:[id number] 30 minutes.  SignedBarbette Merino, MD [facility] 06/28/2022

## 2022-06-28 NOTE — ED Triage Notes (Addendum)
Patient reports sickle cell pain crisis onset last night with generalized body aches/pain and nausea unrelieved by prescription pain medication , denies fever , requested blood tests specimen collection using porta cath .

## 2022-06-28 NOTE — Progress Notes (Addendum)
Patient had a problem with getting a ride home, asked if he could stay until tomorrow and Dr. Jonelle Sidle said no. Patient had known since 10:00 he was discharged. Prescription called in to [facility] in [place] as requested. Discharge instructions given to patient. Still trying to get a ride home.

## 2022-06-28 NOTE — ED Provider Notes (Signed)
[facility] Emergency Department Provider Note MRN:  [id number]  Arrival date & time: 06/29/22     Chief Complaint   Sickle Cell Pain Crisis   History of Present Illness   Martin [name] is a 32 y.o. year-old male presents to the ED with chief complaint of generalized pain that he attributes to his sickle cell disease.  He states that he has some pain in the back an hips.  He denies CP or SOB.  Was just discharged earlier today after having been admitted.  He wears oxygen all of the time.  Denies fever, chills, cough, or chest pain.  History provided by patient.   Review of Systems  Pertinent positive and negative review of systems noted in HPI.    Physical Exam   Vitals:   06/28/22 2230 06/29/22 0054  BP: 105/64   Pulse: 90   Resp: 12   Temp:  98.4 F (36.9 C)  SpO2: 91%     CONSTITUTIONAL:  chronically ill-appearing, NAD NEURO:  Alert and oriented x 3, CN 3-12 grossly intact EYES:  eyes equal and reactive ENT/NECK:  Supple, no stridor  CARDIO:  normal rate, regular rhythm, appears well-perfused  PULM:  No respiratory distress, CTAB GI/GU:  non-distended,  MSK/SPINE:  No gross deformities, no edema, moves all extremities  SKIN:  no rash, atraumatic   *Additional and/or pertinent findings included in MDM below  Diagnostic and Interventional Summary    EKG Interpretation  Date/Time:    Ventricular Rate:    PR Interval:    QRS Duration:   QT Interval:    QTC Calculation:   R Axis:     Text Interpretation:         Labs Reviewed  COMPREHENSIVE METABOLIC PANEL - Abnormal; Notable for the following components:      Result Value   CO2 21 (*)    Glucose, Bld 101 (*)    Total Bilirubin 7.1 (*)    All other components within normal limits  CBC WITH DIFFERENTIAL/PLATELET - Abnormal; Notable for the following components:   WBC 11.2 (*)    RBC 2.03 (*)    Hemoglobin 7.5 (*)    HCT 19.4 (*)    MCH 36.9 (*)    MCHC 38.7 (*)     RDW 26.5 (*)    Platelets 462 (*)    nRBC 19.7 (*)    All other components within normal limits  RETICULOCYTES    DG Chest 2 View  Final Result      Medications  0.45 % sodium chloride infusion ( Intravenous New Bag/Given 06/28/22 2225)  diphenhydrAMINE (BENADRYL) capsule 25-50 mg (25 mg Oral Given 06/28/22 2225)  ondansetron (ZOFRAN-ODT) disintegrating tablet 4 mg (4 mg Oral Given 06/28/22 2159)  HYDROmorphone (DILAUDID) injection 2 mg (2 mg Intravenous Given 06/28/22 2224)  HYDROmorphone (DILAUDID) injection 2 mg (2 mg Intravenous Given 06/28/22 2315)  HYDROmorphone (DILAUDID) injection 2 mg (2 mg Intravenous Given 06/28/22 2345)  oxyCODONE (Oxy IR/ROXICODONE) immediate release tablet 10 mg (10 mg Oral Given 06/29/22 0056)     Procedures  /  Critical Care Procedures  ED Course and Medical Decision Making  I have reviewed the triage vital signs, the nursing notes, and pertinent available records from the EMR.  Social Determinants Affecting Complexity of Care: Patient has no clinically significant social determinants affecting this chief complaint..   ED Course: Clinical Course as of 06/29/22 0116  Mon Jun 29, 2022  0035 Patient reassessed.  States  that he is feeling better and would like to try going home.  I did offer admission, but he doesn't think he needs it.  Will give home dose of oxy and discharge with return precautions. [RB]    Clinical Course User Index [RB] Montine Circle, PA-C    Medical Decision Making Here with sickle cell pain.  Discharged earlier today for the same.  No chest pain.  No SOB.  On O2 24/7.  Anticoagulated on eliquis.     Patient eloped prior to retic results.  He said he was feeling better and I had intended to discharge him.  Amount and/or Complexity of Data Reviewed Labs: ordered. Radiology: ordered.  Risk Prescription drug management.     Consultants: No consultations were needed in caring for this patient.   Treatment and  Plan: I considered admission due to patient's initial presentation, but after considering the examination and diagnostic results, patient will not require admission and can be discharged with outpatient follow-up.    Final Clinical Impressions(s) / ED Diagnoses     ICD-10-CM   1. Sickle cell anemia with pain (HCC)  D57.00       ED Discharge Orders     None         Discharge Instructions Discussed with and Provided to Patient:     Discharge Instructions      Please continue wearing your oxygen at home.  Return for new or worsening symptoms.       Montine Circle, PA-C 06/29/22 0118    Orpah Greek, MD 06/29/22 [phone]

## 2022-06-29 LAB — CBC WITH DIFFERENTIAL/PLATELET
Abs Immature Granulocytes: 0.04 10*3/uL (ref 0.00–0.07)
Basophils Absolute: 0 10*3/uL (ref 0.0–0.1)
Basophils Relative: 0 %
Eosinophils Absolute: 0.1 10*3/uL (ref 0.0–0.5)
Eosinophils Relative: 1 %
HCT: 19.4 % — ABNORMAL LOW (ref 39.0–52.0)
Hemoglobin: 7.5 g/dL — ABNORMAL LOW (ref 13.0–17.0)
Immature Granulocytes: 0 %
Lymphocytes Relative: 35 %
Lymphs Abs: 3.9 10*3/uL (ref 0.7–4.0)
MCH: 36.9 pg — ABNORMAL HIGH (ref 26.0–34.0)
MCHC: 38.7 g/dL — ABNORMAL HIGH (ref 30.0–36.0)
MCV: 95.6 fL (ref 80.0–100.0)
Monocytes Absolute: 0.8 10*3/uL (ref 0.1–1.0)
Monocytes Relative: 7 %
Neutro Abs: 6.4 10*3/uL (ref 1.7–7.7)
Neutrophils Relative %: 57 %
Platelets: 462 10*3/uL — ABNORMAL HIGH (ref 150–400)
RBC: 2.03 MIL/uL — ABNORMAL LOW (ref 4.22–5.81)
RDW: 26.5 % — ABNORMAL HIGH (ref 11.5–15.5)
WBC: 11.2 10*3/uL — ABNORMAL HIGH (ref 4.0–10.5)
nRBC: 19.7 % — ABNORMAL HIGH (ref 0.0–0.2)

## 2022-06-29 LAB — RETICULOCYTES: Retic Ct Pct: 15.8 % — ABNORMAL HIGH (ref 0.4–3.1)

## 2022-06-29 MED ORDER — OXYCODONE HCL 5 MG PO TABS
10.0000 mg | ORAL_TABLET | Freq: Once | ORAL | Status: AC
  Administered 2022-06-29: 10 mg via ORAL
  Filled 2022-06-29: qty 2

## 2022-06-29 NOTE — Discharge Instructions (Signed)
Please continue wearing your oxygen at home.  Return for new or worsening symptoms.

## 2022-06-30 ENCOUNTER — Ambulatory Visit: Payer: Self-pay | Admitting: Family Medicine

## 2022-06-30 NOTE — Hospital Course (Signed)
Patient was admitted with sickle cell pain crisis.  Was placed on Dilaudid PCA Toradol and IV fluids.  Patient also has history of HIV disease and was maintained on his home regimen.  He has history of PE on anticoagulation which was also continued he ultimately got better.  Pain control was adequate.  Ultimately discharged home to follow-up with PCP.

## 2022-07-13 ENCOUNTER — Inpatient Hospital Stay (HOSPITAL_COMMUNITY)
Admission: EM | Admit: 2022-07-13 | Discharge: 2022-07-18 | DRG: 812 | Disposition: A | Discharge status: Home / Self Care | Payer: Medicaid Other | Attending: Internal Medicine | Admitting: Internal Medicine

## 2022-07-13 ENCOUNTER — Encounter (HOSPITAL_COMMUNITY): Payer: Self-pay

## 2022-07-13 ENCOUNTER — Other Ambulatory Visit: Payer: Self-pay

## 2022-07-13 DIAGNOSIS — G894 Chronic pain syndrome: Secondary | ICD-10-CM | POA: Diagnosis present

## 2022-07-13 DIAGNOSIS — D638 Anemia in other chronic diseases classified elsewhere: Secondary | ICD-10-CM | POA: Insufficient documentation

## 2022-07-13 DIAGNOSIS — B20 Human immunodeficiency virus [HIV] disease: Secondary | ICD-10-CM | POA: Diagnosis present

## 2022-07-13 DIAGNOSIS — J9611 Chronic respiratory failure with hypoxia: Secondary | ICD-10-CM | POA: Diagnosis not present

## 2022-07-13 DIAGNOSIS — R636 Underweight: Secondary | ICD-10-CM | POA: Diagnosis present

## 2022-07-13 DIAGNOSIS — Z9981 Dependence on supplemental oxygen: Secondary | ICD-10-CM

## 2022-07-13 DIAGNOSIS — Z681 Body mass index (BMI) 19 or less, adult: Secondary | ICD-10-CM

## 2022-07-13 DIAGNOSIS — D5700 Hb-SS disease with crisis, unspecified: Principal | ICD-10-CM | POA: Diagnosis present

## 2022-07-13 DIAGNOSIS — Z8616 Personal history of COVID-19: Secondary | ICD-10-CM | POA: Diagnosis not present

## 2022-07-13 DIAGNOSIS — Z86711 Personal history of pulmonary embolism: Secondary | ICD-10-CM | POA: Diagnosis present

## 2022-07-13 DIAGNOSIS — Z21 Asymptomatic human immunodeficiency virus [HIV] infection status: Secondary | ICD-10-CM | POA: Diagnosis present

## 2022-07-13 DIAGNOSIS — F419 Anxiety disorder, unspecified: Secondary | ICD-10-CM | POA: Diagnosis present

## 2022-07-13 DIAGNOSIS — Z832 Family history of diseases of the blood and blood-forming organs and certain disorders involving the immune mechanism: Secondary | ICD-10-CM

## 2022-07-13 DIAGNOSIS — Z7901 Long term (current) use of anticoagulants: Secondary | ICD-10-CM | POA: Diagnosis not present

## 2022-07-13 DIAGNOSIS — D571 Sickle-cell disease without crisis: Secondary | ICD-10-CM

## 2022-07-13 DIAGNOSIS — Z888 Allergy status to other drugs, medicaments and biological substances status: Secondary | ICD-10-CM | POA: Diagnosis not present

## 2022-07-13 DIAGNOSIS — F1190 Opioid use, unspecified, uncomplicated: Secondary | ICD-10-CM | POA: Diagnosis not present

## 2022-07-13 DIAGNOSIS — Z91048 Other nonmedicinal substance allergy status: Secondary | ICD-10-CM | POA: Diagnosis not present

## 2022-07-13 LAB — CBC WITH DIFFERENTIAL/PLATELET
Abs Immature Granulocytes: 0.02 10*3/uL (ref 0.00–0.07)
Basophils Absolute: 0 10*3/uL (ref 0.0–0.1)
Basophils Relative: 0 %
Eosinophils Absolute: 0.2 10*3/uL (ref 0.0–0.5)
Eosinophils Relative: 2 %
HCT: 20.7 % — ABNORMAL LOW (ref 39.0–52.0)
Hemoglobin: 7.8 g/dL — ABNORMAL LOW (ref 13.0–17.0)
Immature Granulocytes: 0 %
Lymphocytes Relative: 52 %
Lymphs Abs: 4.2 10*3/uL — ABNORMAL HIGH (ref 0.7–4.0)
MCH: 37.5 pg — ABNORMAL HIGH (ref 26.0–34.0)
MCHC: 37.7 g/dL — ABNORMAL HIGH (ref 30.0–36.0)
MCV: 99.5 fL (ref 80.0–100.0)
Monocytes Absolute: 0.8 10*3/uL (ref 0.1–1.0)
Monocytes Relative: 10 %
Neutro Abs: 2.9 10*3/uL (ref 1.7–7.7)
Neutrophils Relative %: 36 %
Platelets: 243 10*3/uL (ref 150–400)
RBC: 2.08 MIL/uL — ABNORMAL LOW (ref 4.22–5.81)
RDW: 25.2 % — ABNORMAL HIGH (ref 11.5–15.5)
WBC: 8.1 10*3/uL (ref 4.0–10.5)
nRBC: 15.3 % — ABNORMAL HIGH (ref 0.0–0.2)

## 2022-07-13 LAB — COMPREHENSIVE METABOLIC PANEL
ALT: 14 U/L (ref 0–44)
AST: 23 U/L (ref 15–41)
Albumin: 3.9 g/dL (ref 3.5–5.0)
Alkaline Phosphatase: 49 U/L (ref 38–126)
Anion gap: 5 (ref 5–15)
BUN: 11 mg/dL (ref 6–20)
CO2: 24 mmol/L (ref 22–32)
Calcium: 8.5 mg/dL — ABNORMAL LOW (ref 8.9–10.3)
Chloride: 107 mmol/L (ref 98–111)
Creatinine, Ser: 0.76 mg/dL (ref 0.61–1.24)
GFR, Estimated: >60 mL/min (ref >60)
Glucose, Bld: 93 mg/dL (ref 70–99)
Potassium: 3.9 mmol/L (ref 3.5–5.1)
Sodium: 136 mmol/L (ref 135–145)
Total Bilirubin: 9.3 mg/dL — ABNORMAL HIGH (ref 0.3–1.2)
Total Protein: 7.8 g/dL (ref 6.5–8.1)

## 2022-07-13 LAB — RETICULOCYTES
Immature Retic Fract: 25.6 % — ABNORMAL HIGH (ref 2.3–15.9)
RBC.: 2.05 MIL/uL — ABNORMAL LOW (ref 4.22–5.81)
Retic Count, Absolute: 380.5 10*3/uL — ABNORMAL HIGH (ref 19.0–186.0)
Retic Ct Pct: 18.1 % — ABNORMAL HIGH (ref 0.4–3.1)

## 2022-07-13 MED ORDER — DIPHENHYDRAMINE HCL 50 MG/ML IJ SOLN
25.0000 mg | Freq: Once | INTRAMUSCULAR | Status: AC
  Administered 2022-07-13: 25 mg via INTRAVENOUS
  Filled 2022-07-13: qty 1

## 2022-07-13 MED ORDER — APIXABAN 5 MG PO TABS
5.0000 mg | ORAL_TABLET | Freq: Two times a day (BID) | ORAL | Status: DC
  Administered 2022-07-13 – 2022-07-18 (×10): 5 mg via ORAL
  Filled 2022-07-13 (×10): qty 1

## 2022-07-13 MED ORDER — SODIUM CHLORIDE 0.9% FLUSH: 9.0000 mL | INTRAVENOUS | Status: DC | PRN

## 2022-07-13 MED ORDER — GABAPENTIN 100 MG PO CAPS
200.0000 mg | ORAL_CAPSULE | ORAL | Status: DC | PRN
  Administered 2022-07-13 – 2022-07-16 (×3): 200 mg via ORAL
  Filled 2022-07-13 (×4): qty 2

## 2022-07-13 MED ORDER — POLYETHYLENE GLYCOL 3350 17 G PO PACK: 17.0000 g | PACK | Freq: Every day | ORAL | Status: DC | PRN

## 2022-07-13 MED ORDER — HYDROMORPHONE HCL 2 MG/ML IJ SOLN
2.0000 mg | Freq: Once | INTRAMUSCULAR | Status: AC
  Administered 2022-07-13: 2 mg via INTRAVENOUS
  Filled 2022-07-13: qty 1

## 2022-07-13 MED ORDER — FOLIC ACID 1 MG PO TABS
1.0000 mg | ORAL_TABLET | Freq: Every day | ORAL | Status: DC
  Administered 2022-07-13 – 2022-07-18 (×6): 1 mg via ORAL
  Filled 2022-07-13 (×6): qty 1

## 2022-07-13 MED ORDER — NALOXONE HCL 0.4 MG/ML IJ SOLN: 0.4000 mg | INTRAMUSCULAR | Status: DC | PRN

## 2022-07-13 MED ORDER — ONDANSETRON HCL 4 MG/2ML IJ SOLN: 4.0000 mg | Freq: Four times a day (QID) | INTRAMUSCULAR | Status: DC | PRN

## 2022-07-13 MED ORDER — DIPHENHYDRAMINE HCL 25 MG PO CAPS
25.0000 mg | ORAL_CAPSULE | ORAL | Status: DC | PRN
  Administered 2022-07-13: 25 mg via ORAL
  Filled 2022-07-13 (×2): qty 1

## 2022-07-13 MED ORDER — HYDROMORPHONE 1 MG/ML IV SOLN
INTRAVENOUS | Status: DC
  Administered 2022-07-13: 30 mg via INTRAVENOUS
  Administered 2022-07-13: 2.5 mg via INTRAVENOUS
  Administered 2022-07-13: 11 mg via INTRAVENOUS
  Administered 2022-07-14: 4.5 mg via INTRAVENOUS
  Administered 2022-07-14: 5 mg via INTRAVENOUS
  Administered 2022-07-14: 2.5 mg via INTRAVENOUS
  Administered 2022-07-14: 30 mg via INTRAVENOUS
  Filled 2022-07-13 (×2): qty 30

## 2022-07-13 MED ORDER — HYDROMORPHONE HCL 2 MG/ML IJ SOLN
2.0000 mg | INTRAMUSCULAR | Status: DC | PRN
  Administered 2022-07-14: 2 mg via INTRAVENOUS
  Filled 2022-07-13: qty 1

## 2022-07-13 MED ORDER — ABACAVIR-DOLUTEGRAVIR-LAMIVUD 600-50-300 MG PO TABS
1.0000 | ORAL_TABLET | Freq: Every day | ORAL | Status: DC
  Administered 2022-07-13 – 2022-07-17 (×5): 1 via ORAL
  Filled 2022-07-13 (×5): qty 1

## 2022-07-13 MED ORDER — HYDROXYZINE HCL 10 MG PO TABS
10.0000 mg | ORAL_TABLET | Freq: Once | ORAL | Status: AC | PRN
  Administered 2022-07-13: 10 mg via ORAL
  Filled 2022-07-13: qty 1

## 2022-07-13 MED ORDER — HYDROXYUREA 500 MG PO CAPS
2000.0000 mg | ORAL_CAPSULE | Freq: Every day | ORAL | Status: DC
  Administered 2022-07-13 – 2022-07-14 (×2): 2000 mg via ORAL
  Filled 2022-07-13 (×2): qty 4

## 2022-07-13 MED ORDER — SODIUM CHLORIDE 0.45 % IV SOLN: INTRAVENOUS | Status: DC

## 2022-07-13 MED ORDER — SENNOSIDES-DOCUSATE SODIUM 8.6-50 MG PO TABS
1.0000 | ORAL_TABLET | Freq: Two times a day (BID) | ORAL | Status: DC
  Administered 2022-07-13 – 2022-07-14 (×3): 1 via ORAL
  Filled 2022-07-13 (×7): qty 1

## 2022-07-13 NOTE — ED Triage Notes (Signed)
Patient arrived with complaints of generalized pain related to [facility] over the last day with no relief from home medication.

## 2022-07-13 NOTE — H&P (Signed)
H&P  Patient Demographics:  Martin [name], is a 32 y.o. male  MRN: [id number]   DOB - Jan 13, 1991  Admit Date - 07/13/2022  Outpatient Primary MD for the patient is [name], [name]  Chief Complaint  Patient presents with   Sickle Cell Pain Crisis      HPI:   Martin [name]  is a 32 y.o. male with a medical history significant for sickle cell disease, chronic pain syndrome, opiate dependence and tolerance, history of PE on Eliquis, anemia of chronic disease, and history of HIV disease presents to the emergency department with pain primarily to low back, and bilateral upper and lower extremities over the past several days that consistent with his previous sickle cell crisis.  Patient states that pain intensity was elevated upon awakening on yesterday.  He attributes current pain crisis to changes in weather.  He had been taking home opiate medications consistently.  He takes Percocet 10-325 mg at home.  Denies any headache, chest pain, shortness of breath, urinary symptoms, nausea, vomiting, or diarrhea.  Of note, patient is on home oxygen at 2 L.  He denies any sick contacts, or recent travel.  Patient has not had any known exposure to COVID-19.  ER course: While in ER patient's vital signs remained stable: BP 101/72 (BP Location: Left Arm)   Pulse 81   Temp 97.8 F (36.6 C) (Oral)   Resp 12   Ht 6\' 3"  (1.905 m)   Wt 63.5 kg   SpO2 91%   BMI 17.50 kg/m .  Reviewed complete blood count, hemoglobin 7.8, WBCs 8.1, and platelets 243,000.  Reticulocyte percentage 18.1.  Total bilirubin elevated at 9.3, appears to be chronically elevated.  Patient's pain persists despite IV Dilaudid x 1.  EDP requesting admission for patient for further management of his sickle cell pain crisis.   Review of systems:  Review of Systems  Constitutional:  Negative for chills and fever.  HENT: Negative.    Eyes: Negative.   Respiratory: Negative.    Cardiovascular: Negative.   Gastrointestinal:  Negative.   Genitourinary: Negative.   Musculoskeletal:  Positive for back pain and joint pain.  Skin: Negative.   Neurological: Negative.   Endo/Heme/Allergies: Negative.   Psychiatric/Behavioral: Negative.      With Past History of the following :   Past Medical History:  Diagnosis Date   Anxiety    HIV (human immunodeficiency virus infection) ([place])    Proteinuria    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[place])    Vitamin D deficiency 10/2018      Past Surgical History:  Procedure Laterality Date   IR IMAGING GUIDED PORT INSERTION  08/29/2019   IR REMOVAL TUN ACCESS W/ PORT W/O FL MOD SED  08/30/2020   TEE WITHOUT CARDIOVERSION N/A 08/30/2020   Procedure: TRANSESOPHAGEAL ECHOCARDIOGRAM (TEE);  Surgeon: Pixie Casino, MD;  Location: [facility] ENDOSCOPY;  Service: Cardiovascular;  Laterality: N/A;     Social History:   Social History   Tobacco Use   Smoking status: Never   Smokeless tobacco: Never  Substance Use Topics   Alcohol use: No     Lives - At home   Family History :   Family History  Problem Relation Age of Onset   Sickle cell trait Mother    Sickle cell trait Father    Birth defects Maternal Grandmother    Birth defects Paternal Grandmother      Home Medications:   Prior to Admission medications   Medication Sig Start  Date End Date Taking? Authorizing Provider  abacavir-dolutegravir-lamiVUDine (TRIUMEQ) 600-50-300 MG tablet Take 1 tablet by mouth at bedtime.   Yes [provider]  apixaban (ELIQUIS) 5 MG TABS tablet Take 1 tablet (5 mg total) by mouth 2 (two) times daily. 12/02/21  Yes [name], [name]  folic acid (FOLVITE) 1 MG tablet Take 1 mg by mouth daily. 10/10/21  Yes [provider]  gabapentin (NEURONTIN) 100 MG capsule Take 200 mg by mouth as needed (pain). 11/04/21  Yes [provider]  hydroxyurea (HYDREA) 500 MG capsule Take 4 capsules (2,000 mg total) by mouth at bedtime. 10/07/20  Yes [name], [name]   oxyCODONE-acetaminophen (PERCOCET) 10-325 MG tablet Take 1 tablet by mouth every 4 hours as needed for pain. 06/28/22  Yes Elwyn Reach, MD  Oxycodone HCl 10 MG TABS Take 1-2 tablets by mouth every 6 (six) hours as needed (pain). Patient not taking: Reported on 07/13/2022 06/03/22   [provider]     Allergies:   Allergies  Allergen Reactions   Ketorolac Tromethamine Swelling and Other (See Comments)    Patient reports facial edema and left arm edema after administration.   Tape Rash and Other (See Comments)    PLEASE DO NOT USE THE CLEAR, THICK, "PLASTIC" TAPE- only paper tape is tolerated    Wound Dressing Adhesive Rash     Physical Exam:   Vitals:   Vitals:   07/13/22 1510 07/13/22 1537  BP: 101/72   Pulse: 81   Resp: 18 12  Temp: 97.8 F (36.6 C)   SpO2: 93% 91%    Physical Exam: Constitutional: Patient appears well-developed and well-nourished. Not in obvious distress. HENT: Normocephalic, atraumatic, External right and left ear normal. Oropharynx is clear and moist.  Eyes: Conjunctivae and EOM are normal. PERRLA, no scleral icterus. Neck: Normal ROM. Neck supple. No JVD. No tracheal deviation. No thyromegaly. CVS: RRR, S1/S2 +, no murmurs, no gallops, no carotid bruit.  Pulmonary: Effort and breath sounds normal, no stridor, rhonchi, wheezes, rales.  Abdominal: Soft. BS +, no distension, tenderness, rebound or guarding.  Musculoskeletal: Normal range of motion. No edema and no tenderness.  Lymphadenopathy: No lymphadenopathy noted, cervical, inguinal or axillary Neuro: Alert. Normal reflexes, muscle tone coordination. No cranial nerve deficit. Skin: Skin is warm and dry. No rash noted. Not diaphoretic. No erythema. No pallor. Psychiatric: Normal mood and affect. Behavior, judgment, thought content normal.   Data Review:   CBC Recent Labs  Lab 07/13/22 0725  WBC 8.1  HGB 7.8*  HCT 20.7*  PLT 243  MCV 99.5  MCH 37.5*  MCHC 37.7*  RDW 25.2*   LYMPHSABS 4.2*  MONOABS 0.8  EOSABS 0.2  BASOSABS 0.0   ------------------------------------------------------------------------------------------------------------------  Chemistries  Recent Labs  Lab 07/13/22 0725  NA 136  K 3.9  CL 107  CO2 24  GLUCOSE 93  BUN 11  CREATININE 0.76  CALCIUM 8.5*  AST 23  ALT 14  ALKPHOS 49  BILITOT 9.3*   ------------------------------------------------------------------------------------------------------------------ estimated creatinine clearance is 119.1 mL/min (by C-G formula based on SCr of 0.76 mg/dL). ------------------------------------------------------------------------------------------------------------------ No results for input(s): "TSH", "T4TOTAL", "T3FREE", "THYROIDAB" in the last 72 hours.  Invalid input(s): "FREET3"  Coagulation profile No results for input(s): "INR", "PROTIME" in the last 168 hours. ------------------------------------------------------------------------------------------------------------------- No results for input(s): "DDIMER" in the last 72 hours. -------------------------------------------------------------------------------------------------------------------  Cardiac Enzymes No results for input(s): "CKMB", "TROPONINI", "MYOGLOBIN" in the last 168 hours.  Invalid input(s): "CK" ------------------------------------------------------------------------------------------------------------------  Component Value Date/Time   BNP 91.4 05/27/2021 2216    ---------------------------------------------------------------------------------------------------------------  Urinalysis    Component Value Date/Time   COLORURINE YELLOW 02/20/2022 0827   APPEARANCEUR CLEAR 02/20/2022 0827   LABSPEC 1.006 02/20/2022 0827   PHURINE 5.0 02/20/2022 0827   GLUCOSEU NEGATIVE 02/20/2022 0827   HGBUR NEGATIVE 02/20/2022 0827   BILIRUBINUR NEGATIVE 02/20/2022 0827   BILIRUBINUR small (A) 03/18/2021 1218    BILIRUBINUR neg 11/27/2019 1149   KETONESUR NEGATIVE 02/20/2022 0827   PROTEINUR NEGATIVE 02/20/2022 0827   UROBILINOGEN 2.0 (A) 03/18/2021 1218   UROBILINOGEN 4.0 (H) 05/21/2009 1944   NITRITE NEGATIVE 02/20/2022 0827   LEUKOCYTESUR NEGATIVE 02/20/2022 0827    ----------------------------------------------------------------------------------------------------------------   Imaging Results:    No results found.   Assessment & Plan:  Principal Problem:   Sickle cell pain crisis ([place]) Active Problems:   Human immunodeficiency virus (HIV) disease ([facility])   Chronic pain syndrome   History of pulmonary embolus (PE)   Anemia of chronic disease  Sickle cell disease with pain crisis: Admit to MedSurg.  Start IV fluids, 0.45% saline at 100 mL/h Weight-based Dilaudid PCA with settings of 0.5 mg, 10-minute lockout, and 3 mg/h Toradol contraindicated, patient has listed allergy Hold Percocet, will restart as pain intensity improves Monitor vital signs very closely, reevaluate pain scale regularly, and supplemental oxygen as needed.  Anemia of chronic disease: Patient's hemoglobin remained stable and consistent with his baseline.  There is no clinical indication for blood transfusion at this time.  Continue to monitor closely.  History of PE: Continue apixaban  History of HIV: Continue antivirals.  Followed by infectious disease as an outpatient  Chronic pain syndrome: Will restart home pain medications as pain intensity improves  Hyperbilirubinemia: Bilirubin chronically elevated.  Currently 9.3.  Will monitor closely.  Hepatic function panel in AM.  DVT Prophylaxis: Apixaban and SCDs  AM Labs Ordered, also please review Full Orders  Family Communication: Admission, patient's condition and plan of care including tests being ordered have been discussed with the patient who indicate understanding and agree with the plan and Code Status.  Code Status: Full Code  Consults called:  None    Admission status: Inpatient    Time spent in minutes : 30 minutes  [place], [name], [name] [facility] [address] [place], [place] [address] [phone]  07/13/2022 at 6:37 PM

## 2022-07-13 NOTE — ED Provider Notes (Signed)
[facility] Provider Note  CSN: [id number] Arrival date & time: 07/13/22 0440  Chief Complaint(s) Sickle Cell Pain Crisis  HPI Martin [name] is a 32 y.o. male with history of HIV, sickle cell disease, on chronic home oxygen presented to the emergency department with leg pain, low back pain.  He reports this is typical of his sickle cell pain crisis pain.  It began yesterday.  He took some Percocet without relief.  Denies any chest pain, shortness of breath, fevers or chills.  No abdominal pain.  No nausea or vomiting.  No leg swelling.  No numbness or tingling, incontinence.   Past Medical History Past Medical History:  Diagnosis Date   Anxiety    HIV (human immunodeficiency virus infection) ([place])    Proteinuria    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[place])    Vitamin D deficiency 10/2018   Patient Active Problem List   Diagnosis Date Noted   Anemia of chronic disease 07/13/2022   Hyperbilirubinemia 07/13/2022   Tinea capitis 07/29/2021   Sickle cell crisis ([place]) 06/24/2021   Bacteremia due to Enterococcus 08/29/2020   Acute chest syndrome ([place]) 08/26/2020   Hypoxia 07/09/2020   COVID-19 05/13/2020   Sickle cell anemia with pain ([place]) 03/18/2020   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   Abnormal penile discharge, without blood 02/22/2020   Leukocytosis 01/02/2020   Anxiety 11/27/2019   Proteinuria 11/27/2019   Chronic respiratory failure ([place]) 11/16/2019   Chronic, continuous use of opioids 08/15/2019   Seasonal allergies 08/15/2019   Chest congestion    Sickle cell anemia with crisis ([place]) 08/04/2019   Chronic pain syndrome 08/04/2019   History of pulmonary embolus (PE) 08/04/2019   Single subsegmental pulmonary embolism without acute cor pulmonale ([place]) 02/10/2019   Acute chest syndrome due to sickle cell crisis ([place]) 02/10/2019   Vaso-occlusive pain due to sickle cell disease ([place]) 03/09/2018   Bone pain 03/07/2018   Hip pain 03/07/2018   Acute bronchitis due to  Streptococcus 02/21/2018   Heart murmur 02/03/2017   Sickle cell disease with crisis ([place]) 02/02/2017   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 Authorizing Provider  abacavir-dolutegravir-lamiVUDine (TRIUMEQ) 600-50-300 MG tablet Take 1 tablet by mouth at bedtime.   Yes [provider]  apixaban (ELIQUIS) 5 MG TABS tablet Take 1 tablet (5 mg total) by mouth 2 (two) times daily. 12/02/21  Yes [name], [name]  folic acid (FOLVITE) 1 MG tablet Take 1 mg by mouth daily. 10/10/21  Yes [provider]  gabapentin (NEURONTIN) 100 MG capsule Take 200 mg by mouth as needed (pain). 11/04/21  Yes [provider]  hydroxyurea (HYDREA) 500 MG capsule Take 4 capsules (2,000 mg total) by mouth at bedtime. 10/07/20  Yes [name], [name]  oxyCODONE-acetaminophen (PERCOCET) 10-325 MG tablet Take 1 tablet by mouth every 4 hours as needed for pain. 06/28/22  Yes Elwyn Reach, MD  Oxycodone HCl 10 MG TABS Take 1-2 tablets by mouth every 6 (six) hours as needed (pain). Patient not taking: Reported on 07/13/2022 06/03/22   [provider]  Past Surgical History Past Surgical History:  Procedure Laterality Date   IR IMAGING GUIDED PORT INSERTION  08/29/2019   IR REMOVAL TUN ACCESS W/ PORT W/O FL MOD SED  08/30/2020   TEE WITHOUT CARDIOVERSION N/A 08/30/2020   Procedure: TRANSESOPHAGEAL  ECHOCARDIOGRAM (TEE);  Surgeon: Pixie Casino, MD;  Location: [facility] ENDOSCOPY;  Service: Cardiovascular;  Laterality: N/A;   Family History Family History  Problem Relation Age of Onset   Sickle cell trait Mother    Sickle cell trait Father    Birth defects Maternal Grandmother    Birth defects Paternal Grandmother     Social History Social History   Tobacco Use   Smoking status: Never   Smokeless tobacco: Never  Vaping Use   Vaping Use: Never used  Substance Use Topics   Alcohol use: No   Drug use: Yes    Types: Marijuana    Comment: 2x week   Allergies Ketorolac tromethamine, Tape, and Wound dressing adhesive  Review of Systems Review of Systems  All other systems reviewed and are negative.   Physical Exam Vital Signs  I have reviewed the triage vital signs BP 107/73 (BP Location: Left Arm)   Pulse 91   Temp 98.1 F (36.7 C) (Oral)   Resp 18   Ht 6\' 3"  (1.905 m)   Wt 63.5 kg   SpO2 93%   BMI 17.50 kg/m  Physical Exam Vitals and nursing note reviewed.  Constitutional:      General: He is not in acute distress.    Appearance: Normal appearance.  HENT:     Mouth/Throat:     Mouth: Mucous membranes are moist.  Eyes:     Conjunctiva/sclera: Conjunctivae normal.  Cardiovascular:     Rate and Rhythm: Normal rate and regular rhythm.  Pulmonary:     Effort: Pulmonary effort is normal. No respiratory distress.     Breath sounds: Normal breath sounds.  Abdominal:     General: Abdomen is flat.     Palpations: Abdomen is soft.     Tenderness: There is no abdominal tenderness.  Musculoskeletal:     Right lower leg: No edema.     Left lower leg: No edema.     Comments: No midline lumbar spine tenderness  Skin:    General: Skin is warm and dry.     Capillary Refill: Capillary refill takes less than 2 seconds.  Neurological:     Mental Status: He is alert and oriented to person, place, and time. Mental status is at baseline.  Psychiatric:        Mood and  Affect: Mood normal.        Behavior: Behavior normal.     ED Results and Treatments Labs (all labs ordered are listed, but only abnormal results are displayed) Labs Reviewed  COMPREHENSIVE METABOLIC PANEL - Abnormal; Notable for the following components:      Result Value   Calcium 8.5 (*)    Total Bilirubin 9.3 (*)    All other components within normal limits  CBC WITH DIFFERENTIAL/PLATELET - Abnormal; Notable for the following components:   RBC 2.08 (*)    Hemoglobin 7.8 (*)    HCT 20.7 (*)    MCH 37.5 (*)    MCHC 37.7 (*)    RDW 25.2 (*)    nRBC 15.3 (*)    Lymphs Abs 4.2 (*)    All other components within normal limits  RETICULOCYTES - Abnormal; Notable for the following components:  Retic Ct Pct 18.1 (*)    RBC. 2.05 (*)    Retic Count, Absolute 380.5 (*)    Immature Retic Fract 25.6 (*)    All other components within normal limits  BASIC METABOLIC PANEL - Abnormal; Notable for the following components:   Calcium 8.5 (*)    All other components within normal limits  CBC - Abnormal; Notable for the following components:   WBC 11.5 (*)    RBC 1.96 (*)    Hemoglobin 7.3 (*)    HCT 19.4 (*)    MCH 37.2 (*)    MCHC 37.6 (*)    RDW 23.7 (*)    nRBC 5.6 (*)    All other components within normal limits                                                                                                                          Radiology No results found.  Pertinent labs & imaging results that were available during my care of the patient were reviewed by me and considered in my medical decision making (see MDM for details).  Medications Ordered in ED Medications  abacavir-dolutegravir-lamiVUDine (TRIUMEQ) 600-50-300 MG per tablet 1 tablet (1 tablet Oral Given 07/13/22 2142)  hydroxyurea (HYDREA) capsule 2,000 mg (2,000 mg Oral Given 07/13/22 2142)  apixaban (ELIQUIS) tablet 5 mg (5 mg Oral Given Q000111Q Q000111Q)  folic acid (FOLVITE) tablet 1 mg (1 mg Oral Given 07/14/22  0948)  gabapentin (NEURONTIN) capsule 200 mg (200 mg Oral Given 07/13/22 2142)  senna-docusate (Senokot-S) tablet 1 tablet (1 tablet Oral Given 07/14/22 0948)  polyethylene glycol (MIRALAX / GLYCOLAX) packet 17 g (has no administration in time range)  naloxone (NARCAN) injection 0.4 mg (has no administration in time range)    And  sodium chloride flush (NS) 0.9 % injection 9 mL (has no administration in time range)  ondansetron (ZOFRAN) injection 4 mg (has no administration in time range)  diphenhydrAMINE (BENADRYL) capsule 25 mg (25 mg Oral Given 07/13/22 1939)  0.45 % sodium chloride infusion ( Intravenous New Bag/Given 07/14/22 1051)  Chlorhexidine Gluconate Cloth 2 % PADS 6 each (6 each Topical Given 07/14/22 1051)  HYDROmorphone (DILAUDID) 1 mg/mL PCA injection (has no administration in time range)  oxyCODONE (Oxy IR/ROXICODONE) immediate release tablet 10 mg (has no administration in time range)  HYDROmorphone (DILAUDID) injection 2 mg (2 mg Intravenous Given 07/13/22 0751)  diphenhydrAMINE (BENADRYL) injection 25 mg (25 mg Intravenous Given 07/13/22 0750)  HYDROmorphone (DILAUDID) injection 2 mg (2 mg Intravenous Given 07/13/22 1106)  HYDROmorphone (DILAUDID) injection 2 mg (2 mg Intravenous Given 07/13/22 1222)  diphenhydrAMINE (BENADRYL) injection 25 mg (25 mg Intravenous Given 07/13/22 1407)  HYDROmorphone (DILAUDID) injection 2 mg (2 mg Intravenous Given 07/13/22 1408)  hydrOXYzine (ATARAX) tablet 10 mg (10 mg Oral Given 07/13/22 2227)  Procedures .Critical Care  Performed by: Cristie Hem, MD Authorized by: Cristie Hem, MD   Critical care provider statement:    Critical care time (minutes):  30   Critical care was time spent personally by me on the following activities:  Development of treatment plan with patient or surrogate, discussions with  consultants, evaluation of patient's response to treatment, examination of patient, ordering and review of laboratory studies, ordering and review of radiographic studies, ordering and performing treatments and interventions, pulse oximetry, re-evaluation of patient's condition and review of old charts   Care discussed with: admitting provider     (including critical care time)  Medical Decision Making / ED Course   MDM:  32 year old male presenting to the emergency department for low back pain and bilateral leg pain.  Patient well-appearing, vital signs reassuring on home oxygen.  No focal physical exam abnormality.  Symptoms seem consistent with typical sickle cell pain crisis.  Will check basic labs including reticulocyte count to evaluate for aplastic crisis.  No chest pain or shortness of breath to suggest acute chest, pulmonary exam clear.  Patient does report back pain, no red flag symptoms to suggest spinal cord compression, occult infectious process or cauda equina syndrome.  Will reassess.  Clinical Course as of 07/14/22 1402  Mon Jul 13, 2022  1401 Patient continuing to have intractable pain,, Discussed with sickle cell team who will admit [WS]    Clinical Course User Index [WS] Cristie Hem, MD     Additional history obtained:  -External records from outside source obtained and reviewed including: Chart review including previous notes, labs, imaging, consultation notes including ED visit 3/10    Lab Tests: -I ordered, reviewed, and interpreted labs.   The pertinent results include:   Labs Reviewed  COMPREHENSIVE METABOLIC PANEL - Abnormal; Notable for the following components:      Result Value   Calcium 8.5 (*)    Total Bilirubin 9.3 (*)    All other components within normal limits  CBC WITH DIFFERENTIAL/PLATELET - Abnormal; Notable for the following components:   RBC 2.08 (*)    Hemoglobin 7.8 (*)    HCT 20.7 (*)    MCH 37.5 (*)    MCHC 37.7 (*)     RDW 25.2 (*)    nRBC 15.3 (*)    Lymphs Abs 4.2 (*)    All other components within normal limits  RETICULOCYTES - Abnormal; Notable for the following components:   Retic Ct Pct 18.1 (*)    RBC. 2.05 (*)    Retic Count, Absolute 380.5 (*)    Immature Retic Fract 25.6 (*)    All other components within normal limits  BASIC METABOLIC PANEL - Abnormal; Notable for the following components:   Calcium 8.5 (*)    All other components within normal limits  CBC - Abnormal; Notable for the following components:   WBC 11.5 (*)    RBC 1.96 (*)    Hemoglobin 7.3 (*)    HCT 19.4 (*)    MCH 37.2 (*)    MCHC 37.6 (*)    RDW 23.7 (*)    nRBC 5.6 (*)    All other components within normal limits    Notable for chronic anemia    Medicines ordered and prescription drug management: Meds ordered this encounter  Medications   HYDROmorphone (DILAUDID) injection 2 mg   diphenhydrAMINE (BENADRYL) injection 25 mg   HYDROmorphone (DILAUDID) injection 2 mg   HYDROmorphone (DILAUDID) injection  2 mg   diphenhydrAMINE (BENADRYL) injection 25 mg   HYDROmorphone (DILAUDID) injection 2 mg   abacavir-dolutegravir-lamiVUDine (TRIUMEQ) 600-50-300 MG per tablet 1 tablet   hydroxyurea (HYDREA) capsule 2,000 mg   apixaban (ELIQUIS) tablet 5 mg   folic acid (FOLVITE) tablet 1 mg   gabapentin (NEURONTIN) capsule 200 mg   senna-docusate (Senokot-S) tablet 1 tablet   polyethylene glycol (MIRALAX / GLYCOLAX) packet 17 g   AND Linked Order Group    naloxone (NARCAN) injection 0.4 mg    sodium chloride flush (NS) 0.9 % injection 9 mL   ondansetron (ZOFRAN) injection 4 mg   diphenhydrAMINE (BENADRYL) capsule 25 mg   0.45 % sodium chloride infusion   DISCONTD: HYDROmorphone (DILAUDID) 1 mg/mL PCA injection    Order Specific Question:   Dose:    Answer:   HYDROmorphone (DILAUDID) PCA for Sickle Cell    Order Specific Question:   PCA Patient Bolus Dose (in mg):    Answer:   0.5    Order Specific Question:    Lockout Interval (in minutes)    Answer:   10    Order Specific Question:   1 Hour Dose Limit (in mg):    Answer:   3    Order Specific Question:   Basal Infusion Rate    Answer:   No   DISCONTD: HYDROmorphone (DILAUDID) injection 2 mg   hydrOXYzine (ATARAX) tablet 10 mg   Chlorhexidine Gluconate Cloth 2 % PADS 6 each   HYDROmorphone (DILAUDID) 1 mg/mL PCA injection    Order Specific Question:   Dose:    Answer:   HYDROmorphone (DILAUDID) PCA for Sickle Cell    Order Specific Question:   PCA Patient Bolus Dose (in mg):    Answer:   0.5    Order Specific Question:   Lockout Interval (in minutes)    Answer:   10    Order Specific Question:   1 Hour Dose Limit (in mg):    Answer:   2    Order Specific Question:   Basal Infusion Rate    Answer:   No   oxyCODONE (Oxy IR/ROXICODONE) immediate release tablet 10 mg    -I have reviewed the patients home medicines and have made adjustments as needed   Consultations Obtained: I requested consultation with the sickle cell team,  and discussed lab and imaging findings as well as pertinent plan - they recommend: admission   Cardiac Monitoring: The patient was maintained on a cardiac monitor.  I personally viewed and interpreted the cardiac monitored which showed an underlying rhythm of: NSR  Social Determinants of Health:  Diagnosis or treatment significantly limited by social determinants of health: Marijuana use   Reevaluation: After the interventions noted above, I reevaluated the patient and found that their symptoms have improved  Co morbidities that complicate the patient evaluation  Past Medical History:  Diagnosis Date   Anxiety    HIV (human immunodeficiency virus infection) ([place])    Proteinuria    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[place])    Vitamin D deficiency 10/2018      Dispostion: Disposition decision including need for hospitalization was considered, and patient admitted to the hospital.    Final Clinical Impression(s) / ED  Diagnoses Final diagnoses:  Sickle cell pain crisis ([place])     This chart was dictated using voice recognition software.  Despite best efforts to proofread,  errors can occur which can change the documentation meaning.    Cristie Hem, MD 07/14/22  1402  

## 2022-07-14 ENCOUNTER — Ambulatory Visit: Payer: Medicaid Other | Admitting: Family Medicine

## 2022-07-14 DIAGNOSIS — D5700 Hb-SS disease with crisis, unspecified: Secondary | ICD-10-CM | POA: Diagnosis not present

## 2022-07-14 LAB — CBC
HCT: 19.4 % — ABNORMAL LOW (ref 39.0–52.0)
Hemoglobin: 7.3 g/dL — ABNORMAL LOW (ref 13.0–17.0)
MCH: 37.2 pg — ABNORMAL HIGH (ref 26.0–34.0)
MCHC: 37.6 g/dL — ABNORMAL HIGH (ref 30.0–36.0)
MCV: 99 fL (ref 80.0–100.0)
Platelets: 268 K/uL (ref 150–400)
RBC: 1.96 MIL/uL — ABNORMAL LOW (ref 4.22–5.81)
RDW: 23.7 % — ABNORMAL HIGH (ref 11.5–15.5)
WBC: 11.5 K/uL — ABNORMAL HIGH (ref 4.0–10.5)
nRBC: 5.6 % — ABNORMAL HIGH (ref 0.0–0.2)

## 2022-07-14 LAB — BASIC METABOLIC PANEL
Anion gap: 6 (ref 5–15)
BUN: 15 mg/dL (ref 6–20)
CO2: 26 mmol/L (ref 22–32)
Calcium: 8.5 mg/dL — ABNORMAL LOW (ref 8.9–10.3)
Chloride: 104 mmol/L (ref 98–111)
Creatinine, Ser: 0.99 mg/dL (ref 0.61–1.24)
GFR, Estimated: >60 mL/min (ref >60)
Glucose, Bld: 87 mg/dL (ref 70–99)
Potassium: 4.5 mmol/L (ref 3.5–5.1)
Sodium: 136 mmol/L (ref 135–145)

## 2022-07-14 MED ORDER — CHLORHEXIDINE GLUCONATE CLOTH 2 % EX PADS
6.0000 | MEDICATED_PAD | Freq: Every day | CUTANEOUS | Status: DC
  Administered 2022-07-14 – 2022-07-18 (×5): 6 via TOPICAL

## 2022-07-14 MED ORDER — HYDROMORPHONE 1 MG/ML IV SOLN
INTRAVENOUS | Status: DC
  Administered 2022-07-14 – 2022-07-15 (×2): 7.5 mg via INTRAVENOUS
  Administered 2022-07-15: 4.5 mg via INTRAVENOUS
  Administered 2022-07-15: 7 mg via INTRAVENOUS
  Administered 2022-07-15: 30 mg via INTRAVENOUS
  Administered 2022-07-15: 10.5 mg via INTRAVENOUS
  Administered 2022-07-15: 5 mg via INTRAVENOUS
  Administered 2022-07-15: 4.5 mg via INTRAVENOUS
  Administered 2022-07-16: 7.5 mg via INTRAVENOUS
  Administered 2022-07-16: 5 mg via INTRAVENOUS
  Administered 2022-07-16: 9 mg via INTRAVENOUS
  Administered 2022-07-16: 6.5 mg via INTRAVENOUS
  Administered 2022-07-16: 30 mg via INTRAVENOUS
  Administered 2022-07-17: 10.5 mg via INTRAVENOUS
  Administered 2022-07-17: 7 mg via INTRAVENOUS
  Administered 2022-07-17: 30 mg via INTRAVENOUS
  Administered 2022-07-18: 5 mg via INTRAVENOUS
  Filled 2022-07-14 (×4): qty 30

## 2022-07-14 MED ORDER — OXYCODONE HCL 5 MG PO TABS
10.0000 mg | ORAL_TABLET | ORAL | Status: DC | PRN
  Administered 2022-07-14 – 2022-07-18 (×19): 10 mg via ORAL
  Filled 2022-07-14 (×20): qty 2

## 2022-07-14 NOTE — Progress Notes (Signed)
Subjective: Martin [name] is a 32 year old male with a medical history significant for sickle cell disease, chronic pain syndrome, opiate dependence and tolerance, anemia of chronic disease, history of PE on Eliquis, and anemia of chronic disease was admitted for sickle cell pain crisis. Patient states that pain intensity improved minimally overnight.  He rates pain as 7/10.  Patient denies any headache, chest pain, urinary symptoms, nausea, vomiting, or diarrhea.  Objective:  Vital signs in last 24 hours:  Vitals:   07/14/22 0751 07/14/22 0944 07/14/22 1027 07/14/22 1246  BP:   100/61   Pulse:   92   Resp: 16 16 20 18   Temp:   98.5 F (36.9 C)   TempSrc:   Oral   SpO2: 90% 91% 91% 93%  Weight:      Height:        Intake/Output from previous day:   Intake/Output Summary (Last 24 hours) at 07/14/2022 1258 Last data filed at 07/14/2022 1030 Gross per 24 hour  Intake 2144.96 ml  Output 1700 ml  Net 444.96 ml    Physical Exam: General: Alert, awake, oriented x3, in no acute distress.  HEENT: [place]/AT PEERL, EOMI Neck: Trachea midline,  no masses, no thyromegal,y no JVD, no carotid bruit OROPHARYNX:  Moist, No exudate/ erythema/lesions.  Heart: Regular rate and rhythm, without murmurs, rubs, gallops, PMI non-displaced, no heaves or thrills on palpation.  Lungs: Clear to auscultation, no wheezing or rhonchi noted. No increased vocal fremitus resonant to percussion  Abdomen: Soft, nontender, nondistended, positive bowel sounds, no masses no hepatosplenomegaly noted..  Neuro: No focal neurological deficits noted cranial nerves II through XII grossly intact. DTRs 2+ bilaterally upper and lower extremities. Strength 5 out of 5 in bilateral upper and lower extremities. Musculoskeletal: No warm swelling or erythema around joints, no spinal tenderness noted. Psychiatric: Patient alert and oriented x3, good insight and cognition, good recent to remote recall. Lymph node survey: No  cervical axillary or inguinal lymphadenopathy noted.  Lab Results:  Basic Metabolic Panel:    Component Value Date/Time   NA 136 07/14/2022 0323   NA 141 03/31/2022 1228   K 4.5 07/14/2022 0323   CL 104 07/14/2022 0323   CO2 26 07/14/2022 0323   BUN 15 07/14/2022 0323   BUN 10 03/31/2022 1228   CREATININE 0.99 07/14/2022 0323   GLUCOSE 87 07/14/2022 0323   CALCIUM 8.5 (L) 07/14/2022 0323   CBC:    Component Value Date/Time   WBC 11.5 (H) 07/14/2022 0323   HGB 7.3 (L) 07/14/2022 0323   HGB 9.5 (L) 03/31/2022 1228   HCT 19.4 (L) 07/14/2022 0323   HCT 26.1 (L) 03/31/2022 1228   PLT 268 07/14/2022 0323   PLT 318 03/31/2022 1228   MCV 99.0 07/14/2022 0323   MCV 107 (H) 03/31/2022 1228   NEUTROABS 2.9 07/13/2022 0725   NEUTROABS 3.5 03/31/2022 1228   LYMPHSABS 4.2 (H) 07/13/2022 0725   LYMPHSABS 6.1 (H) 03/31/2022 1228   MONOABS 0.8 07/13/2022 0725   EOSABS 0.2 07/13/2022 0725   EOSABS 0.1 03/31/2022 1228   BASOSABS 0.0 07/13/2022 0725   BASOSABS 0.1 03/31/2022 1228    No results found for this or any previous visit (from the past 240 hour(s)).  Studies/Results: No results found.  Medications: Scheduled Meds:  abacavir-dolutegravir-lamiVUDine  1 tablet Oral QHS   apixaban  5 mg Oral BID   Chlorhexidine Gluconate Cloth  6 each Topical Daily   folic acid  1 mg Oral Daily   HYDROmorphone  Intravenous Q4H   hydroxyurea  2,000 mg Oral QHS   senna-docusate  1 tablet Oral BID   Continuous Infusions:  sodium chloride 100 mL/hr at 07/14/22 1051   PRN Meds:.diphenhydrAMINE, gabapentin, naloxone **AND** sodium chloride flush, ondansetron (ZOFRAN) IV, oxyCODONE, polyethylene glycol  Consultants: none  Procedures: none  Antibiotics: none  Assessment/Plan: Principal Problem:   Sickle cell pain crisis ([place]) Active Problems:   Human immunodeficiency virus (HIV) disease ([facility])   Chronic pain syndrome   History of pulmonary embolus (PE)   Anemia of chronic  disease   Hyperbilirubinemia  Sickle cell disease with pain crisis: Decrease IV fluids to KVO Continue IV Dilaudid PCA Oxycodone 10 mg every 4 hours as needed for severe pain Monitor vital signs very closely, reevaluate pain scale regularly, and supplemental oxygen as needed  Anemia of chronic disease: Patient's hemoglobin is stable and consistent with baseline.  Continue to monitor closely.  There is no clinical indication for blood transfusion at this time.  History of PE: Continue apixaban  History of HIV: Continue antivirals.  Followed by infectious disease as an outpatient.  Chronic pain syndrome: Continue home medications  Hyperbilirubinemia: Stable.  Secondary to sickle cell disease.  Monitor closely.  Code Status: Full Code Family Communication: N/A Disposition Plan: Not yet ready for discharge  [place], [name], [name] Patient [facility] [address] [place], [place] [address] [phone]  If 7PM-7AM, please contact night-coverage.  07/14/2022, 12:58 PM  LOS: 1 day

## 2022-07-14 NOTE — Plan of Care (Signed)
Discussed with patient plan of care for the evening, pain management and itching with some teach back displayed.  Requested extra medications for itching after as needed medication wasn't effective.  Patient ate sandwich and chips as well.  Problem: Education: Goal: Knowledge of vaso-occlusive preventative measures will improve Outcome: Progressing   Problem: Coping: Goal: Level of anxiety will decrease Outcome: Not Progressing

## 2022-07-15 DIAGNOSIS — D5700 Hb-SS disease with crisis, unspecified: Secondary | ICD-10-CM | POA: Diagnosis not present

## 2022-07-15 LAB — BASIC METABOLIC PANEL
Anion gap: 5 (ref 5–15)
BUN: 13 mg/dL (ref 6–20)
CO2: 21 mmol/L — ABNORMAL LOW (ref 22–32)
Calcium: 7.2 mg/dL — ABNORMAL LOW (ref 8.9–10.3)
Chloride: 111 mmol/L (ref 98–111)
Creatinine, Ser: 0.65 mg/dL (ref 0.61–1.24)
GFR, Estimated: >60 mL/min (ref >60)
Glucose, Bld: 69 mg/dL — ABNORMAL LOW (ref 70–99)
Potassium: 3.5 mmol/L (ref 3.5–5.1)
Sodium: 137 mmol/L (ref 135–145)

## 2022-07-15 LAB — CBC
HCT: 13.2 % — ABNORMAL LOW (ref 39.0–52.0)
Hemoglobin: 6 g/dL — CL (ref 13.0–17.0)
MCH: 46.2 pg — ABNORMAL HIGH (ref 26.0–34.0)
MCHC: 45.5 g/dL — ABNORMAL HIGH (ref 30.0–36.0)
MCV: 101.5 fL — ABNORMAL HIGH (ref 80.0–100.0)
Platelets: 233 10*3/uL (ref 150–400)
RBC: 1.3 MIL/uL — ABNORMAL LOW (ref 4.22–5.81)
RDW: 27.7 % — ABNORMAL HIGH (ref 11.5–15.5)
WBC: 7.9 10*3/uL (ref 4.0–10.5)
nRBC: 6.9 % — ABNORMAL HIGH (ref 0.0–0.2)

## 2022-07-15 LAB — PREPARE RBC (CROSSMATCH)

## 2022-07-15 MED ORDER — HYDROMORPHONE HCL 1 MG/ML IJ SOLN
1.0000 mg | Freq: Once | INTRAMUSCULAR | Status: AC
  Administered 2022-07-15: 1 mg via INTRAVENOUS
  Filled 2022-07-15: qty 1

## 2022-07-15 MED ORDER — SODIUM CHLORIDE 0.9% IV SOLUTION: Freq: Once | INTRAVENOUS | Status: AC

## 2022-07-15 MED ORDER — DIPHENHYDRAMINE HCL 50 MG/ML IJ SOLN
25.0000 mg | Freq: Once | INTRAMUSCULAR | Status: AC
  Administered 2022-07-15: 25 mg via INTRAVENOUS
  Filled 2022-07-15: qty 1

## 2022-07-15 MED ORDER — ACETAMINOPHEN 325 MG PO TABS
650.0000 mg | ORAL_TABLET | Freq: Once | ORAL | Status: AC
  Administered 2022-07-15: 650 mg via ORAL
  Filled 2022-07-15: qty 2

## 2022-07-15 NOTE — Progress Notes (Signed)
Hydroxyurea (Hydrea) HOLD criteria: ? ANC < 2000 ? Pltc < 80K in sickle-cell patients; < 100K in other patients X Hgb ? 6 in sickle-cell patients; < 8 in other patients ? Reticulocytes < 80K when Hgb < 9 ? NOTE: In patients receiving for sickle cell disease, no auto-hold for fever/infection unless ANC low as described above   Plan: Hold Hydroxyurea for now per protocol.   Martin [name], PharmD, BCPS Clinical Staff Pharmacist [email or web address]

## 2022-07-15 NOTE — Progress Notes (Signed)
Critical hgb received this morning of 6.0.  Lemar Lofty NP paged per protocol, no new orders received, will continue to monitor

## 2022-07-15 NOTE — Progress Notes (Signed)
Subjective: Martin [name] is a 32 year old male with a medical history significant for sickle cell disease, chronic pain syndrome, opiate dependence and tolerance, anemia of chronic disease, history of PE on Eliquis, and anemia of chronic disease was admitted for sickle cell pain crisis. Patient states that pain intensity improved minimally overnight.  He rates pain as 7/10.  Patient denies any headache, chest pain, urinary symptoms, nausea, vomiting, or diarrhea.  Objective:  Vital signs in last 24 hours:  Vitals:   07/15/22 0628 07/15/22 0639 07/15/22 0954 07/15/22 0955  BP:   94/64   Pulse:   77 83  Resp: 14 18 15    Temp:   97.8 F (36.6 C)   TempSrc:   Oral   SpO2:  93% (!) 88% 91%  Weight:      Height:        Intake/Output from previous day:   Intake/Output Summary (Last 24 hours) at 07/15/2022 1023 Last data filed at 07/15/2022 0845 Gross per 24 hour  Intake 720 ml  Output 2175 ml  Net -1455 ml    Physical Exam: General: Alert, awake, oriented x3, in no acute distress.  HEENT: [place]/AT PEERL, EOMI Neck: Trachea midline,  no masses, no thyromegal,y no JVD, no carotid bruit OROPHARYNX:  Moist, No exudate/ erythema/lesions.  Heart: Regular rate and rhythm, without murmurs, rubs, gallops, PMI non-displaced, no heaves or thrills on palpation.  Lungs: Clear to auscultation, no wheezing or rhonchi noted. No increased vocal fremitus resonant to percussion  Abdomen: Soft, nontender, nondistended, positive bowel sounds, no masses no hepatosplenomegaly noted..  Neuro: No focal neurological deficits noted cranial nerves II through XII grossly intact. DTRs 2+ bilaterally upper and lower extremities. Strength 5 out of 5 in bilateral upper and lower extremities. Musculoskeletal: No warm swelling or erythema around joints, no spinal tenderness noted. Psychiatric: Patient alert and oriented x3, good insight and cognition, good recent to remote recall. Lymph node survey: No cervical  axillary or inguinal lymphadenopathy noted.  Lab Results:  Basic Metabolic Panel:    Component Value Date/Time   NA 137 07/15/2022 0500   NA 141 03/31/2022 1228   K 3.5 07/15/2022 0500   CL 111 07/15/2022 0500   CO2 21 (L) 07/15/2022 0500   BUN 13 07/15/2022 0500   BUN 10 03/31/2022 1228   CREATININE 0.65 07/15/2022 0500   GLUCOSE 69 (L) 07/15/2022 0500   CALCIUM 7.2 (L) 07/15/2022 0500   CBC:    Component Value Date/Time   WBC 7.9 07/15/2022 0500   HGB 6.0 (LL) 07/15/2022 0500   HGB 9.5 (L) 03/31/2022 1228   HCT 13.2 (L) 07/15/2022 0500   HCT 26.1 (L) 03/31/2022 1228   PLT 233 07/15/2022 0500   PLT 318 03/31/2022 1228   MCV 101.5 (H) 07/15/2022 0500   MCV 107 (H) 03/31/2022 1228   NEUTROABS 2.9 07/13/2022 0725   NEUTROABS 3.5 03/31/2022 1228   LYMPHSABS 4.2 (H) 07/13/2022 0725   LYMPHSABS 6.1 (H) 03/31/2022 1228   MONOABS 0.8 07/13/2022 0725   EOSABS 0.2 07/13/2022 0725   EOSABS 0.1 03/31/2022 1228   BASOSABS 0.0 07/13/2022 0725   BASOSABS 0.1 03/31/2022 1228    No results found for this or any previous visit (from the past 240 hour(s)).  Studies/Results: No results found.  Medications: Scheduled Meds:  sodium chloride   Intravenous Once   abacavir-dolutegravir-lamiVUDine  1 tablet Oral QHS   acetaminophen  650 mg Oral Once   apixaban  5 mg Oral BID   Chlorhexidine  Gluconate Cloth  6 each Topical Daily   diphenhydrAMINE  25 mg Intravenous Once   folic acid  1 mg Oral Daily   HYDROmorphone   Intravenous Q4H   senna-docusate  1 tablet Oral BID   Continuous Infusions:  sodium chloride 10 mL/hr at 07/14/22 1504   PRN Meds:.diphenhydrAMINE, gabapentin, naloxone **AND** sodium chloride flush, ondansetron (ZOFRAN) IV, oxyCODONE, polyethylene glycol  Consultants: none  Procedures: none  Antibiotics: none  Assessment/Plan: Principal Problem:   Sickle cell pain crisis ([place]) Active Problems:   Human immunodeficiency virus (HIV) disease (HCC)    Chronic pain syndrome   History of pulmonary embolus (PE)   Anemia of chronic disease   Hyperbilirubinemia  Sickle cell disease with pain crisis: Decrease IV fluids to KVO Continue IV Dilaudid PCA Oxycodone 10 mg every 4 hours as needed for severe pain Monitor vital signs very closely, reevaluate pain scale regularly, and supplemental oxygen as needed  Anemia of chronic disease: Patient's hemoglobin is stable and consistent with baseline.  Continue to monitor closely.  There is no clinical indication for blood transfusion at this time.  History of PE: Continue apixaban  History of HIV: Continue antivirals.  Followed by infectious disease as an outpatient.  Chronic pain syndrome: Continue home medications  Hyperbilirubinemia: Stable.  Secondary to sickle cell disease.  Monitor closely.  Code Status: Full Code Family Communication: N/A Disposition Plan: Not yet ready for discharge  [place], [name], [name] Patient [facility] [address] [place], [place] [address] [phone]  If 7PM-7AM, please contact night-coverage.  07/15/2022, 10:23 AM  LOS: 2 days

## 2022-07-15 NOTE — TOC Initial Note (Addendum)
Transition of Care [facility]) - Initial/Assessment Note    Patient Details  Name: Martin [name] MRN: [id number] Date of Birth: 1990-08-08  Transition of Care [facility]) CM/SW Contact:    Angelita Ingles, RN Phone Number:[phone]  07/15/2022, 1:20 PM  Clinical Narrative:                 TOC following patient high risk for readmission. Cm at bedside for assessment. Patient states that he is from home where he is independent. Patient states he follows with PCP  [name], [name]. Patient states that he has access to him medications with no issues. Patient has no DME needs.Patient states that he does have transportation to appointment and any follow up visits. Patient reports no home health services or needs. TOC following for any disposition needs.  Expected Discharge Plan: Home/Self Care Barriers to Discharge: Continued Medical Work up   Patient Goals and CMS Choice Patient states their goals for this hospitalization and ongoing recovery are:: Wants to gewt better to go home CMS Medicare.gov Compare Post Acute Care list provided to::  (n/a) Choice offered to / list presented to : NA [place] ownership interest in [facility].provided to::  (n/a)    Expected Discharge Plan and Services In-house Referral: NA Discharge Planning Services: NA Post Acute Care Choice: NA Living arrangements for the past 2 months: Single Family Home                 DME Arranged: N/A DME Agency: NA       HH Arranged: NA [employer] Agency: NA        Prior Living Arrangements/Services Living arrangements for the past 2 months: Single Family Home Lives with:: Self Patient language and need for interpreter reviewed:: Yes Do you feel safe going back to the place where you live?: Yes      Need for Family Participation in Patient Care: No (Comment) Care giver support system in place?: Yes (comment) Current home services:  (n/a) Criminal Activity/Legal Involvement Pertinent to Current  Situation/Hospitalization: No - Comment as needed  Activities of Daily Living Home Assistive Devices/Equipment: Oxygen ADL Screening (condition at time of admission) Patient's cognitive ability adequate to safely complete daily activities?: Yes Is the patient deaf or have difficulty hearing?: No Does the patient have difficulty seeing, even when wearing glasses/contacts?: No Does the patient have difficulty concentrating, remembering, or making decisions?: No Patient able to express need for assistance with ADLs?: Yes Does the patient have difficulty dressing or bathing?: No Independently performs ADLs?: Yes (appropriate for developmental age) Does the patient have difficulty walking or climbing stairs?: No Weakness of Legs: None Weakness of Arms/Hands: None  Permission Sought/Granted Permission sought to share information with : Family Supports Permission granted to share information with : No              Emotional Assessment Appearance:: Appears stated age   Affect (typically observed): Pleasant Orientation: : Oriented to Self, Oriented to Place, Oriented to  Time, Oriented to Situation Alcohol / Substance Use: Not Applicable Psych Involvement: No (comment)  Admission diagnosis:  Sickle cell pain crisis ([place]) [D57.00] Patient Active Problem List   Diagnosis Date Noted   Anemia of chronic disease 07/13/2022   Hyperbilirubinemia 07/13/2022   Tinea capitis 07/29/2021   Sickle cell crisis ([place]) 06/24/2021   Bacteremia due to Enterococcus 08/29/2020   Acute chest syndrome ([place]) 08/26/2020   Hypoxia 07/09/2020   COVID-19 05/13/2020   Sickle cell anemia with pain (  [place]) 03/18/2020   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   Abnormal penile discharge, without blood 02/22/2020   Leukocytosis 01/02/2020   Anxiety 11/27/2019   Proteinuria 11/27/2019   Chronic respiratory failure ([place]) 11/16/2019   Chronic, continuous use of opioids 08/15/2019   Seasonal allergies 08/15/2019   Chest  congestion    Sickle cell anemia with crisis ([place]) 08/04/2019   Chronic pain syndrome 08/04/2019   History of pulmonary embolus (PE) 08/04/2019   Single subsegmental pulmonary embolism without acute cor pulmonale ([place]) 02/10/2019   Acute chest syndrome due to sickle cell crisis ([place]) 02/10/2019   Vaso-occlusive pain due to sickle cell disease ([place]) 03/09/2018   Bone pain 03/07/2018   Hip pain 03/07/2018   Acute bronchitis due to Streptococcus 02/21/2018   Heart murmur 02/03/2017   Sickle cell disease with crisis ([place]) 02/02/2017   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/20/2016   Vitamin D deficiency 08/14/2016   Sickle cell pain crisis ([place]) 02/12/2016   High risk medication use 09/27/2014   Generalized anxiety disorder 05/19/2014   Gastro-esophageal reflux disease without esophagitis 05/19/2014   Marijuana use 11/07/2012   Human immunodeficiency virus (HIV) disease ([place]) 05/23/2012   PCP:  [name], [name] Pharmacy:   [facility] [place] [address] Phone: [phone] Fax: [phone]  [facility] DR[facility] STORE [place], [place] [address] Phone: [phone] Fax: [phone]  [facility] [id number] - [place], [place] [address] [place] [address] Phone: [phone] Fax: [phone]  [id number] - [place], [place] [address] Phone: [phone] Fax: [phone]  [facility] [address] [place], [place] [address] [place] [address] Phone: [phone] Fax: [phone]  [id number] [place], [place] [address] [address] Phone: [phone] Fax: [phone]     Social Determinants of Health (SDOH) Social History: SDOH Screenings   Food Insecurity: No Food Insecurity (07/13/2022)  Housing: Low Risk  (07/13/2022)  Transportation Needs: No Transportation Needs (07/13/2022)  Utilities: Not At Risk (07/13/2022)  Depression (PHQ2-9): Low Risk  (03/31/2022)  Tobacco Use: Low Risk  (07/13/2022)   SDOH Interventions:     Readmission Risk Interventions    07/15/2022    1:02 PM 06/23/2022    9:44 AM 08/15/2021    1:48 PM  Readmission Risk Prevention Plan  Transportation Screening Complete Complete Complete  PCP or Specialist Appt within 5-7 Days  Complete   PCP or Specialist Appt within 3-5 Days Complete    Home Care Screening  Complete   Medication Review (RN CM)  Complete   HRI or Home Care Consult Complete    Social Work Consult for [place] Planning/Counseling Complete    Palliative Care Screening Not Applicable    Medication Review Press photographer) Complete  Complete  PCP or Specialist appointment within 3-5 days of discharge   Complete  HRI or [place]   Complete  SW Recovery Care/Counseling Consult   Complete  [place]   Not Applicable

## 2022-07-16 DIAGNOSIS — D5700 Hb-SS disease with crisis, unspecified: Secondary | ICD-10-CM | POA: Diagnosis not present

## 2022-07-16 LAB — CBC
HCT: 19.8 % — ABNORMAL LOW (ref 39.0–52.0)
Hemoglobin: 7.6 g/dL — ABNORMAL LOW (ref 13.0–17.0)
MCH: 36 pg — ABNORMAL HIGH (ref 26.0–34.0)
MCHC: 38.4 g/dL — ABNORMAL HIGH (ref 30.0–36.0)
MCV: 93.8 fL (ref 80.0–100.0)
Platelets: 266 10*3/uL (ref 150–400)
RBC: 2.11 MIL/uL — ABNORMAL LOW (ref 4.22–5.81)
RDW: 23 % — ABNORMAL HIGH (ref 11.5–15.5)
WBC: 8.4 10*3/uL (ref 4.0–10.5)
nRBC: 8.5 % — ABNORMAL HIGH (ref 0.0–0.2)

## 2022-07-16 LAB — BPAM RBC
Blood Product Expiration Date: 202404182359
Blood Product Expiration Date: 202404182359
ISSUE DATE / TIME: 202403271525
ISSUE DATE / TIME: 202403271818
Unit Type and Rh: 9500
Unit Type and Rh: 9500

## 2022-07-16 LAB — TYPE AND SCREEN
ABO/RH(D): O POS
Antibody Screen: NEGATIVE
Donor AG Type: NEGATIVE
Donor AG Type: NEGATIVE
Unit division: 0
Unit division: 0

## 2022-07-16 MED ORDER — HYDROXYZINE HCL 25 MG PO TABS
25.0000 mg | ORAL_TABLET | Freq: Once | ORAL | Status: AC
  Administered 2022-07-16: 25 mg via ORAL
  Filled 2022-07-16: qty 1

## 2022-07-16 NOTE — Progress Notes (Signed)
Subjective: Martin [name] is a 32 year old male with a medical history significant for sickle cell disease, chronic pain syndrome, opiate dependence and tolerance, anemia of chronic disease, history of PE on Eliquis, and anemia of chronic disease was admitted for sickle cell pain crisis.  Today, patient's hemoglobin decreased to 6.0 g/dL, which is below his baseline of 7-8.  He rates pain as 7/10.  Patient denies any headache, chest pain, urinary symptoms, nausea, vomiting, or diarrhea.  Objective:  Vital signs in last 24 hours:  Vitals:   07/16/22 0707 07/16/22 0734 07/16/22 1012 07/16/22 1144  BP: 107/82  117/79   Pulse: 85  90   Resp: 18 15 20 14   Temp: 98.4 F (36.9 C)  98.4 F (36.9 C)   TempSrc: Oral  Oral   SpO2: 90% 92% (!) 89% 92%  Weight:      Height:        Intake/Output from previous day:   Intake/Output Summary (Last 24 hours) at 07/16/2022 1154 Last data filed at 07/16/2022 1016 Gross per 24 hour  Intake 2642.23 ml  Output 800 ml  Net 1842.23 ml    Physical Exam: General: Alert, awake, oriented x3, in no acute distress.  HEENT: [place]/AT PEERL, EOMI Neck: Trachea midline,  no masses, no thyromegal,y no JVD, no carotid bruit OROPHARYNX:  Moist, No exudate/ erythema/lesions.  Heart: Regular rate and rhythm, without murmurs, rubs, gallops, PMI non-displaced, no heaves or thrills on palpation.  Lungs: Clear to auscultation, no wheezing or rhonchi noted. No increased vocal fremitus resonant to percussion  Abdomen: Soft, nontender, nondistended, positive bowel sounds, no masses no hepatosplenomegaly noted..  Neuro: No focal neurological deficits noted cranial nerves II through XII grossly intact. DTRs 2+ bilaterally upper and lower extremities. Strength 5 out of 5 in bilateral upper and lower extremities. Musculoskeletal: No warm swelling or erythema around joints, no spinal tenderness noted. Psychiatric: Patient alert and oriented x3, good insight and cognition,  good recent to remote recall. Lymph node survey: No cervical axillary or inguinal lymphadenopathy noted.  Lab Results:  Basic Metabolic Panel:    Component Value Date/Time   NA 137 07/15/2022 0500   NA 141 03/31/2022 1228   K 3.5 07/15/2022 0500   CL 111 07/15/2022 0500   CO2 21 (L) 07/15/2022 0500   BUN 13 07/15/2022 0500   BUN 10 03/31/2022 1228   CREATININE 0.65 07/15/2022 0500   GLUCOSE 69 (L) 07/15/2022 0500   CALCIUM 7.2 (L) 07/15/2022 0500   CBC:    Component Value Date/Time   WBC 8.4 07/16/2022 0506   HGB 7.6 (L) 07/16/2022 0506   HGB 9.5 (L) 03/31/2022 1228   HCT 19.8 (L) 07/16/2022 0506   HCT 26.1 (L) 03/31/2022 1228   PLT 266 07/16/2022 0506   PLT 318 03/31/2022 1228   MCV 93.8 07/16/2022 0506   MCV 107 (H) 03/31/2022 1228   NEUTROABS 2.9 07/13/2022 0725   NEUTROABS 3.5 03/31/2022 1228   LYMPHSABS 4.2 (H) 07/13/2022 0725   LYMPHSABS 6.1 (H) 03/31/2022 1228   MONOABS 0.8 07/13/2022 0725   EOSABS 0.2 07/13/2022 0725   EOSABS 0.1 03/31/2022 1228   BASOSABS 0.0 07/13/2022 0725   BASOSABS 0.1 03/31/2022 1228    No results found for this or any previous visit (from the past 240 hour(s)).  Studies/Results: No results found.  Medications: Scheduled Meds:  abacavir-dolutegravir-lamiVUDine  1 tablet Oral QHS   apixaban  5 mg Oral BID   Chlorhexidine Gluconate Cloth  6 each Topical Daily  folic acid  1 mg Oral Daily   HYDROmorphone   Intravenous Q4H   senna-docusate  1 tablet Oral BID   Continuous Infusions:  sodium chloride 10 mL/hr at 07/15/22 1548   PRN Meds:.diphenhydrAMINE, gabapentin, naloxone **AND** sodium chloride flush, ondansetron (ZOFRAN) IV, oxyCODONE, polyethylene glycol  Consultants: none  Procedures: none  Antibiotics: none  Assessment/Plan: Principal Problem:   Sickle cell pain crisis ([place]) Active Problems:   Human immunodeficiency virus (HIV) disease (HCC)   Chronic pain syndrome   History of pulmonary embolus (PE)    Anemia of chronic disease   Hyperbilirubinemia  Sickle cell disease with pain crisis: Continue IV Dilaudid PCA Oxycodone 10 mg every 4 hours as needed for severe pain Monitor vital signs very closely, reevaluate pain scale regularly, and supplemental oxygen as needed  Anemia of chronic disease: Patient's hemoglobin is stable and consistent with baseline.  Continue to monitor closely.  There is no clinical indication for blood transfusion at this time.  History of PE: Continue apixaban  HIV disease:  Continue antivirals.  Patient has not had a recent CD4 count.  Patient is followed by infectious disease in [place].  He is under the [employer].  Followed by infectious disease as an outpatient.  Chronic pain syndrome: Continue home medications  Hyperbilirubinemia: Stable.  Secondary to sickle cell disease.  Monitor closely.  Chronic hypoxia with respiratory distress: Patient is on home oxygen at 2 L, will continue.  Code Status: Full Code Family Communication: N/A Disposition Plan: Not yet ready for discharge  [place], [name], [name] Patient [facility] [address] [place], [place] [address] [phone]  If 7PM-7AM, please contact night-coverage.  07/16/2022, 11:54 AM  LOS: 3 days

## 2022-07-17 DIAGNOSIS — D5700 Hb-SS disease with crisis, unspecified: Secondary | ICD-10-CM | POA: Diagnosis not present

## 2022-07-17 LAB — CD4/CD8 (T-HELPER/T-SUPPRESSOR CELL)
CD4 absolute: 920 /uL (ref 400–1790)
CD4%: 30.59 % — ABNORMAL LOW (ref 33–65)
CD8 T Cell Abs: 1123 /uL — ABNORMAL HIGH (ref 190–1000)
CD8tox: 37.31 % (ref 12–40)
Ratio: 0.82 — ABNORMAL LOW (ref 1.0–3.0)
Total lymphocyte count: 3009 /uL (ref 1000–4000)

## 2022-07-17 LAB — CBC WITH DIFFERENTIAL/PLATELET
Abs Immature Granulocytes: 0.02 10*3/uL (ref 0.00–0.07)
Basophils Absolute: 0 10*3/uL (ref 0.0–0.1)
Basophils Relative: 0 %
Eosinophils Absolute: 0.1 10*3/uL (ref 0.0–0.5)
Eosinophils Relative: 2 %
HCT: 22.7 % — ABNORMAL LOW (ref 39.0–52.0)
Hemoglobin: 8.6 g/dL — ABNORMAL LOW (ref 13.0–17.0)
Immature Granulocytes: 0 %
Lymphocytes Relative: 61 %
Lymphs Abs: 5.7 10*3/uL — ABNORMAL HIGH (ref 0.7–4.0)
MCH: 34.8 pg — ABNORMAL HIGH (ref 26.0–34.0)
MCHC: 37.9 g/dL — ABNORMAL HIGH (ref 30.0–36.0)
MCV: 91.9 fL (ref 80.0–100.0)
Monocytes Absolute: 1 10*3/uL (ref 0.1–1.0)
Monocytes Relative: 10 %
Neutro Abs: 2.6 10*3/uL (ref 1.7–7.7)
Neutrophils Relative %: 27 %
Platelets: 365 10*3/uL (ref 150–400)
RBC: 2.47 MIL/uL — ABNORMAL LOW (ref 4.22–5.81)
RDW: 23.4 % — ABNORMAL HIGH (ref 11.5–15.5)
WBC: 9.4 10*3/uL (ref 4.0–10.5)
nRBC: 1.8 % — ABNORMAL HIGH (ref 0.0–0.2)

## 2022-07-17 LAB — LACTATE DEHYDROGENASE: LDH: 564 U/L — ABNORMAL HIGH (ref 98–192)

## 2022-07-17 MED ORDER — HYDROXYUREA 500 MG PO CAPS
2000.0000 mg | ORAL_CAPSULE | Freq: Every day | ORAL | Status: DC
  Administered 2022-07-17: 2000 mg via ORAL
  Filled 2022-07-17: qty 4

## 2022-07-17 MED ORDER — HEPARIN SOD (PORK) LOCK FLUSH 100 UNIT/ML IV SOLN
500.0000 [IU] | Freq: Once | INTRAVENOUS | Status: DC
  Filled 2022-07-17 (×2): qty 5

## 2022-07-17 MED ORDER — LIP MEDEX EX OINT
1.0000 | TOPICAL_OINTMENT | CUTANEOUS | Status: DC | PRN
  Administered 2022-07-17: 1 via TOPICAL
  Filled 2022-07-17: qty 7

## 2022-07-17 NOTE — Progress Notes (Signed)
Subjective: Martin [name] is a 32 year old male with a medical history significant for sickle cell disease, chronic pain syndrome, opiate dependence and tolerance, anemia of chronic disease, history of PE on Eliquis, and anemia of chronic disease was admitted for sickle cell pain crisis.  Patient rates pain as 5-6/10.  He is requesting 1 more day in the hospital.  Patient denies any headache, chest pain, urinary symptoms, nausea, vomiting, or diarrhea.  Objective:  Vital signs in last 24 hours:  Vitals:   07/17/22 1300 07/17/22 1537 07/17/22 1828 07/17/22 1829  BP:   124/81   Pulse:   (!) 107   Resp:  18 18 18   Temp:   98.6 F (37 C)   TempSrc:   Oral   SpO2: 92% 92% (!) 85% 92%  Weight:      Height:        Intake/Output from previous day:   Intake/Output Summary (Last 24 hours) at 07/17/2022 1912 Last data filed at 07/17/2022 1828 Gross per 24 hour  Intake 1100 ml  Output 800 ml  Net 300 ml    Physical Exam: General: Alert, awake, oriented x3, in no acute distress.  HEENT: [place]/AT PEERL, EOMI Neck: Trachea midline,  no masses, no thyromegal,y no JVD, no carotid bruit OROPHARYNX:  Moist, No exudate/ erythema/lesions.  Heart: Regular rate and rhythm, without murmurs, rubs, gallops, PMI non-displaced, no heaves or thrills on palpation.  Lungs: Clear to auscultation, no wheezing or rhonchi noted. No increased vocal fremitus resonant to percussion  Abdomen: Soft, nontender, nondistended, positive bowel sounds, no masses no hepatosplenomegaly noted..  Neuro: No focal neurological deficits noted cranial nerves II through XII grossly intact. DTRs 2+ bilaterally upper and lower extremities. Strength 5 out of 5 in bilateral upper and lower extremities. Musculoskeletal: No warm swelling or erythema around joints, no spinal tenderness noted. Psychiatric: Patient alert and oriented x3, good insight and cognition, good recent to remote recall. Lymph node survey: No cervical axillary or  inguinal lymphadenopathy noted.  Lab Results:  Basic Metabolic Panel:    Component Value Date/Time   NA 137 07/15/2022 0500   NA 141 03/31/2022 1228   K 3.5 07/15/2022 0500   CL 111 07/15/2022 0500   CO2 21 (L) 07/15/2022 0500   BUN 13 07/15/2022 0500   BUN 10 03/31/2022 1228   CREATININE 0.65 07/15/2022 0500   GLUCOSE 69 (L) 07/15/2022 0500   CALCIUM 7.2 (L) 07/15/2022 0500   CBC:    Component Value Date/Time   WBC 9.4 07/17/2022 1248   HGB 8.6 (L) 07/17/2022 1248   HGB 9.5 (L) 03/31/2022 1228   HCT 22.7 (L) 07/17/2022 1248   HCT 26.1 (L) 03/31/2022 1228   PLT 365 07/17/2022 1248   PLT 318 03/31/2022 1228   MCV 91.9 07/17/2022 1248   MCV 107 (H) 03/31/2022 1228   NEUTROABS 2.6 07/17/2022 1248   NEUTROABS 3.5 03/31/2022 1228   LYMPHSABS 5.7 (H) 07/17/2022 1248   LYMPHSABS 6.1 (H) 03/31/2022 1228   MONOABS 1.0 07/17/2022 1248   EOSABS 0.1 07/17/2022 1248   EOSABS 0.1 03/31/2022 1228   BASOSABS 0.0 07/17/2022 1248   BASOSABS 0.1 03/31/2022 1228    No results found for this or any previous visit (from the past 240 hour(s)).  Studies/Results: No results found.  Medications: Scheduled Meds:  abacavir-dolutegravir-lamiVUDine  1 tablet Oral QHS   apixaban  5 mg Oral BID   Chlorhexidine Gluconate Cloth  6 each Topical Daily   folic acid  1 mg  Oral Daily   heparin lock flush  500 Units Intravenous Once   HYDROmorphone   Intravenous Q4H   hydroxyurea  2,000 mg Oral QHS   senna-docusate  1 tablet Oral BID   Continuous Infusions:  sodium chloride 10 mL/hr at 07/15/22 1548   PRN Meds:.diphenhydrAMINE, gabapentin, lip balm, naloxone **AND** sodium chloride flush, ondansetron (ZOFRAN) IV, oxyCODONE, polyethylene glycol  Consultants: none  Procedures: none  Antibiotics: none  Assessment/Plan: Principal Problem:   Sickle cell pain crisis ([place]) Active Problems:   Human immunodeficiency virus (HIV) disease (HCC)   Chronic pain syndrome   History of pulmonary  embolus (PE)   Anemia of chronic disease   Hyperbilirubinemia  Sickle cell disease with pain crisis: Continue IV Dilaudid PCA Oxycodone 10 mg every 4 hours as needed for severe pain Monitor vital signs very closely, reevaluate pain scale regularly, and supplemental oxygen as needed  Anemia of chronic disease: Patient's hemoglobin is stable and consistent with baseline.  Continue to monitor closely.  There is no clinical indication for blood transfusion at this time.  History of PE: Continue apixaban  HIV disease:  Continue antivirals.   Patient is followed by infectious disease in [place].  He is under the [employer].  Followed by infectious disease as an outpatient.  Chronic pain syndrome: Continue home medications  Hyperbilirubinemia: Stable.  Secondary to sickle cell disease.  Monitor closely.  Chronic hypoxia with respiratory distress: Patient is on home oxygen at 2 L, will continue.  Code Status: Full Code Family Communication: N/A Disposition Plan: Not yet ready for discharge.  Discharge plan for 07/18/2022  Donia Pounds  APRN, [name], [name] Patient [facility] [place], [place] [address] [phone]  If 7PM-7AM, please contact night-coverage.  07/17/2022, 7:12 PM  LOS: 4 days

## 2022-07-18 DIAGNOSIS — F1190 Opioid use, unspecified, uncomplicated: Secondary | ICD-10-CM | POA: Diagnosis not present

## 2022-07-18 DIAGNOSIS — D571 Sickle-cell disease without crisis: Secondary | ICD-10-CM

## 2022-07-18 DIAGNOSIS — D5700 Hb-SS disease with crisis, unspecified: Secondary | ICD-10-CM | POA: Diagnosis not present

## 2022-07-18 MED ORDER — HYDROCORTISONE 1 % EX CREA
1.0000 | TOPICAL_CREAM | Freq: Three times a day (TID) | CUTANEOUS | Status: DC | PRN
  Administered 2022-07-18: 1 via TOPICAL
  Filled 2022-07-18: qty 28

## 2022-07-18 MED ORDER — OXYCODONE-ACETAMINOPHEN 10-325 MG PO TABS: 1.0000 | ORAL_TABLET | ORAL | 0 refills | Status: DC | PRN

## 2022-07-18 NOTE — Discharge Summary (Signed)
Physician Discharge Summary  [name] [id number] DOB: [birth year] DOA: 07/13/2022  PCP: [name], [name]  Admit date: 07/13/2022  Discharge date: 07/18/2022  Discharge Diagnoses:  Principal Problem:   Sickle cell pain crisis ([place]) Active Problems:   Human immunodeficiency virus (HIV) disease ([place])   Chronic pain syndrome   History of pulmonary embolus (PE)   Anemia of chronic disease   Hyperbilirubinemia   Discharge Condition: Stable  Disposition:   Follow-up Information     [name], [name] Follow up in 1 week(s).   Specialty: Family Medicine Contact information: [address]. [place] [id number] [phone]                Pt is discharged home in good condition and is to follow up with [name], [name] this week to have labs evaluated. Martin [name] is instructed to increase activity slowly and balance with rest for the next few days, and use prescribed medication to complete treatment of pain  Diet: Regular Wt Readings from Last 3 Encounters:  07/13/22 63.5 kg  06/28/22 68 kg  06/22/22 63.5 kg    History of present illness:  Martin [name]  is a 32 y.o. male with a medical history significant for sickle cell disease, chronic pain syndrome, opiate dependence and tolerance, history of PE on Eliquis, anemia of chronic disease, and history of HIV disease presents to the emergency department with pain primarily to low back, and bilateral upper and lower extremities over the past several days that consistent with his previous sickle cell crisis.  Patient states that pain intensity was elevated upon awakening on yesterday.  He attributes current pain crisis to changes in weather.  He had been taking home opiate medications consistently.  He takes Percocet 10-325 mg at home.  Denies any headache, chest pain, shortness of breath, urinary symptoms, nausea, vomiting, or diarrhea.  Of note, patient is on home oxygen at 2 L.   He denies any sick contacts, or recent travel.  Patient has not had any known exposure to COVID-19.   ER course: While in ER patient's vital signs remained stable: BP 101/72 (BP Location: Left Arm)   Pulse 81   Temp 97.8 F (36.6 C) (Oral)   Resp 12   Ht 6\' 3"  (1.905 m)   Wt 63.5 kg   SpO2 91%   BMI 17.50 kg/m .  Reviewed complete blood count, hemoglobin 7.8, WBCs 8.1, and platelets 243,000.  Reticulocyte percentage 18.1.  Total bilirubin elevated at 9.3, appears to be chronically elevated.  Patient's pain persists despite IV Dilaudid x 1.  EDP requesting admission for patient for further management of his sickle cell pain crisis.  Hospital Course:  Patient was admitted for sickle cell pain crisis and managed appropriately with IVF, IV Dilaudid via PCA as well as other adjunct therapies per sickle cell pain management protocols.  Toradol is contraindicated due to allergies.  Patient's hemoglobin dropped to a nadir of 6.0 for which he was transfused with 1 unit of packed red blood cell and hemoglobin improved significantly first to 7.6 and then to 8.6 at the time of discharge.  Patient's symptoms slowly improved. He was successfully weaned off IV Dilaudid via PCA and transitioned to his home pain medications without pain rebound. He has remained hemodynamically stable throughout his admission. As at today, patient is tolerating p.o. intake with no significant restrictions and ambulating well with no significant pain. Patient was therefore discharged home today  in a hemodynamically stable condition.   Surafel will follow-up with PCP within 1 week of this discharge. Martin [name] was counseled extensively about nonpharmacologic means of pain management, patient verbalized understanding and was appreciative of  the care received during this admission.   We discussed the need for good hydration, monitoring of hydration status, avoidance of heat, cold, stress, and infection triggers. We discussed the need  to be adherent with taking Hydrea and other home medications. Patient was reminded of the need to seek medical attention immediately if any symptom of bleeding, anemia, or infection occurs.  Discharge Exam: Vitals:   07/18/22 1034 07/18/22 1133  BP: 91/64   Pulse: 93   Resp: 20 18  Temp: 98.4 F (36.9 C)   SpO2: 90% 92%   Vitals:   07/18/22 0422 07/18/22 0753 07/18/22 1034 07/18/22 1133  BP:   91/64   Pulse:   93   Resp: 14 16 20 18   Temp:   98.4 F (36.9 C)   TempSrc:   Oral   SpO2: 93% 92% 90% 92%  Weight:      Height:        General appearance : Awake, alert, not in any distress. Speech Clear. Not toxic looking HEENT: Atraumatic and Normocephalic, pupils equally reactive to light and accomodation Neck: Supple, no JVD. No cervical lymphadenopathy.  Chest: Good air entry bilaterally, no added sounds  CVS: S1 S2 regular, no murmurs.  Abdomen: Bowel sounds present, Non tender and not distended with no gaurding, rigidity or rebound. Extremities: B/L Lower Ext shows no edema, both legs are warm to touch Neurology: Awake alert, and oriented X 3, CN II-XII intact, Non focal Skin: No Rash  Discharge Instructions  Discharge Instructions     Diet - low sodium heart healthy   Complete by: As directed    Increase activity slowly   Complete by: As directed       Allergies as of 07/18/2022       Reactions   Ketorolac Tromethamine Swelling, Other (See Comments)   Patient reports facial edema and left arm edema after administration.   Tape Rash, Other (See Comments)   PLEASE DO NOT USE THE CLEAR, THICK, "PLASTIC" TAPE- only paper tape is tolerated    Wound Dressing Adhesive Rash        Medication List     STOP taking these medications    Oxycodone HCl 10 MG Tabs       TAKE these medications    Eliquis 5 MG Tabs tablet Generic drug: apixaban Take 1 tablet (5 mg total) by mouth 2 (two) times daily.   folic acid 1 MG tablet Commonly known as: FOLVITE Take 1  mg by mouth daily.   gabapentin 100 MG capsule Commonly known as: NEURONTIN Take 200 mg by mouth as needed (pain).   hydroxyurea 500 MG capsule Commonly known as: HYDREA Take 4 capsules (2,000 mg total) by mouth at bedtime.   oxyCODONE-acetaminophen 10-325 MG tablet Commonly known as: Percocet Take 1 tablet by mouth every 4 hours as needed for pain.   Triumeq 600-50-300 MG tablet Generic drug: abacavir-dolutegravir-lamiVUDine Take 1 tablet by mouth at bedtime.        The results of significant diagnostics from this hospitalization (including imaging, microbiology, ancillary and laboratory) are listed below for reference.    Significant Diagnostic Studies: DG Chest 2 View  Result Date: 06/28/2022 CLINICAL DATA:  Low saturation.  Sickle cell pain crisis EXAM: CHEST - 2 VIEW COMPARISON:  06/22/2022  FINDINGS: Right chest wall Port-A-Cath tip in the mid SVC. Stable cardiomediastinal silhouette. Similar diffuse interstitial coarsening and chronic bronchitic change. Scarring/atelectasis in the right mid lung. No focal consolidation, pleural effusion, or pneumothorax. No displaced rib fractures. IMPRESSION: No significant change from 06/22/2022. Chronic interstitial coarsening and chronic bronchitic change. Electronically Signed   By: Placido Sou M.D.   On: 06/28/2022 21:29   DG Chest Port 1 View  Result Date: 06/22/2022 CLINICAL DATA:  Pt states that he has been hurting "everywhere" x2 days. Pt states that he has tried his home meds with no relief. EXAM: PORTABLE CHEST - 1 VIEW COMPARISON:  09/01/2021 FINDINGS: Stable right IJ port catheter. Coarse perihilar and bibasilar interstitial opacities as before. No new infiltrate or overt edema. Heart size and mediastinal contours are within normal limits. No effusion. Visualized bones unremarkable. IMPRESSION: No acute cardiopulmonary disease. Electronically Signed   By: Lucrezia Europe M.D.   On: 06/22/2022 16:28    Microbiology: No results found  for this or any previous visit (from the past 240 hour(s)).   Labs: Basic Metabolic Panel: Recent Labs  Lab 07/13/22 0725 07/14/22 0323 07/15/22 0500  NA 136 136 137  K 3.9 4.5 3.5  CL 107 104 111  CO2 24 26 21*  GLUCOSE 93 87 69*  BUN 11 15 13   CREATININE 0.76 0.99 0.65  CALCIUM 8.5* 8.5* 7.2*   Liver Function Tests: Recent Labs  Lab 07/13/22 0725  AST 23  ALT 14  ALKPHOS 49  BILITOT 9.3*  PROT 7.8  ALBUMIN 3.9   No results for input(s): "LIPASE", "AMYLASE" in the last 168 hours. No results for input(s): "AMMONIA" in the last 168 hours. CBC: Recent Labs  Lab 07/13/22 0725 07/14/22 0323 07/15/22 0500 07/16/22 0506 07/17/22 1248  WBC 8.1 11.5* 7.9 8.4 9.4  NEUTROABS 2.9  --   --   --  2.6  HGB 7.8* 7.3* 6.0* 7.6* 8.6*  HCT 20.7* 19.4* 13.2* 19.8* 22.7*  MCV 99.5 99.0 101.5* 93.8 91.9  PLT 243 268 233 266 365   Cardiac Enzymes: No results for input(s): "CKTOTAL", "CKMB", "CKMBINDEX", "TROPONINI" in the last 168 hours. BNP: Invalid input(s): "POCBNP" CBG: No results for input(s): "GLUCAP" in the last 168 hours.  Time coordinating discharge: 50 minutes  Signed:  [facility] 07/18/2022, 11:42 AM

## 2022-07-20 ENCOUNTER — Telehealth: Payer: Self-pay

## 2022-07-20 NOTE — Transitions of Care (Post Inpatient/ED Visit) (Unsigned)
   07/20/2022  Name: Martin [name] MRN: [id number] DOB: April 15, 1991  Today's TOC FU Call Status: Today's TOC FU Call Status:: Unsuccessul Call (1st Attempt) Unsuccessful Call (1st Attempt) Date: 07/20/22  Attempted to reach the patient regarding the most recent Inpatient/ED visit.  Follow Up Plan: Additional outreach attempts will be made to reach the patient to complete the Transitions of Care (Post Inpatient/ED visit) call.   Norton Blizzard, [place] (AAMA)  [facility] [phone]

## 2022-07-27 ENCOUNTER — Telehealth: Payer: Self-pay | Admitting: Family Medicine

## 2022-07-27 NOTE — Telephone Encounter (Signed)
Oxycodone refill request.

## 2022-07-28 ENCOUNTER — Other Ambulatory Visit: Payer: Self-pay | Admitting: Family Medicine

## 2022-07-29 ENCOUNTER — Telehealth: Payer: Self-pay

## 2022-07-29 NOTE — Transitions of Care (Post Inpatient/ED Visit) (Signed)
   07/29/2022  Name: Martin [name] MRN: [id number] DOB: 06/17/1990  Today's TOC FU Call Status: Today's TOC FU Call Status:: Unsuccessful Call (2nd Attempt) Unsuccessful Call (1st Attempt) Date: 07/20/22 Unsuccessful Call (2nd Attempt) Date: 07/29/22  Attempted to reach the patient regarding the most recent Inpatient/ED visit.  Follow Up Plan: Additional outreach attempts will be made to reach the patient to complete the Transitions of Care (Post Inpatient/ED visit) call.   Agnes Lawrence, CMA (AAMA)  [facility] [phone]

## 2022-08-03 ENCOUNTER — Telehealth: Payer: Self-pay

## 2022-08-03 NOTE — Telephone Encounter (Signed)
Caller & Relationship to patient:  MRN #  [id number]   Call Back Number:   Date of Last Office Visit: 07/29/2022     Date of Next Office Visit: 10/13/2022    Medication(s) to be Refilled: Percocet  Preferred Pharmacy: [id number] - [place], [place]   ** Please notify patient to allow 48-72 hours to process** **Let patient know to contact pharmacy at the end of the day to make sure medication is ready. ** **If patient has not been seen in a year or longer, book an appointment **Advise to use MyChart for refill requests OR to contact their pharmacy

## 2022-08-03 NOTE — Transitions of Care (Post Inpatient/ED Visit) (Signed)
   08/03/2022  Name: Martin [name] MRN: [id number] DOB: 1990/11/17  Today's TOC FU Call Status: Today's TOC FU Call Status:: Unsuccessful Call (3rd Attempt) Unsuccessful Call (3rd Attempt) Date: 08/03/22  Attempted to reach the patient regarding the most recent Inpatient/ED visit.  Follow Up Plan: No further outreach attempts will be made at this time. We have been unable to contact the patient.  Agnes Lawrence, CMA (AAMA)  [facility] [phone]

## 2022-08-04 ENCOUNTER — Other Ambulatory Visit: Payer: Self-pay | Admitting: Family Medicine

## 2022-08-04 DIAGNOSIS — D571 Sickle-cell disease without crisis: Secondary | ICD-10-CM

## 2022-08-04 DIAGNOSIS — F1190 Opioid use, unspecified, uncomplicated: Secondary | ICD-10-CM

## 2022-08-04 MED ORDER — OXYCODONE-ACETAMINOPHEN 10-325 MG PO TABS: 1.0000 | ORAL_TABLET | ORAL | 0 refills | Status: DC | PRN

## 2022-08-04 NOTE — Progress Notes (Signed)
Reviewed PDMP substance reporting system prior to prescribing opiate medications. No inconsistencies noted.  Meds ordered this encounter  Medications   oxyCODONE-acetaminophen (PERCOCET) 10-325 MG tablet    Sig: Take 1 tablet by mouth every 4 hours as needed for pain.    Dispense:  90 tablet    Refill:  0    Order Specific Question:   Supervising Provider    Answer:   JEGEDE, OLUGBEMIGA E [[id number]]   Martin Amico Moore Livana Yerian  APRN, [name], [name] [facility] [address]  [place], [place] [address] [phone]  

## 2022-08-27 ENCOUNTER — Telehealth: Payer: Self-pay | Admitting: Family Medicine

## 2022-08-27 NOTE — Telephone Encounter (Signed)
Caller & Relationship to patient:  MRN #  [id number]   Call Back Number:   Date of Last Office Visit: 08/03/2022     Date of Next Office Visit: 10/13/2022    Medication(s) to be Refilled: Percocet and Oxycodone  Preferred Pharmacy:   ** Please notify patient to allow 48-72 hours to process** **Let patient know to contact pharmacy at the end of the day to make sure medication is ready. ** **If patient has not been seen in a year or longer, book an appointment **Advise to use MyChart for refill requests OR to contact their pharmacy

## 2022-08-28 ENCOUNTER — Other Ambulatory Visit: Payer: Self-pay | Admitting: Family Medicine

## 2022-08-28 DIAGNOSIS — F1190 Opioid use, unspecified, uncomplicated: Secondary | ICD-10-CM

## 2022-08-28 DIAGNOSIS — D571 Sickle-cell disease without crisis: Secondary | ICD-10-CM

## 2022-08-28 MED ORDER — OXYCODONE-ACETAMINOPHEN 10-325 MG PO TABS: 1.0000 | ORAL_TABLET | ORAL | 0 refills | Status: DC | PRN

## 2022-08-28 NOTE — Progress Notes (Signed)
Reviewed PDMP substance reporting system prior to prescribing opiate medications. No inconsistencies noted.  Meds ordered this encounter  Medications   oxyCODONE-acetaminophen (PERCOCET) 10-325 MG tablet    Sig: Take 1 tablet by mouth every 4 hours as needed for pain.    Dispense:  90 tablet    Refill:  0    Order Specific Question:   Supervising Provider    Answer:   JEGEDE, OLUGBEMIGA E [[id number]]   Zayvon Alicea Moore Kathaleya Mcduffee  APRN, [name], [name] [facility] [address]  [place], [place] [address] [phone]  

## 2022-09-11 ENCOUNTER — Other Ambulatory Visit: Payer: Self-pay | Admitting: Nurse Practitioner

## 2022-09-11 ENCOUNTER — Telehealth (HOSPITAL_COMMUNITY): Payer: Self-pay

## 2022-09-11 ENCOUNTER — Other Ambulatory Visit (HOSPITAL_COMMUNITY): Payer: Self-pay

## 2022-09-11 ENCOUNTER — Other Ambulatory Visit: Payer: Self-pay | Admitting: Internal Medicine

## 2022-09-11 ENCOUNTER — Telehealth: Payer: Self-pay | Admitting: Family Medicine

## 2022-09-11 DIAGNOSIS — F1190 Opioid use, unspecified, uncomplicated: Secondary | ICD-10-CM

## 2022-09-11 DIAGNOSIS — D571 Sickle-cell disease without crisis: Secondary | ICD-10-CM

## 2022-09-11 MED ORDER — OXYCODONE-ACETAMINOPHEN 10-325 MG PO TABS
1.0000 | ORAL_TABLET | ORAL | 0 refills | Status: DC | PRN
  Filled 2022-09-11 (×2): qty 90, 15d supply, fill #0

## 2022-09-11 NOTE — Telephone Encounter (Signed)
Patient called in. Complains of generalized pain rates 8/10. Denied chest pain, abd pain, fever, N/V/D, or priapism. Wants to come in for treatment. Last took Percocet at 6:00am. Pt states he was in the ED a few days ago. Pt states an Benedetto Goad will be his transportation to and from center today. Dr. Hyman Hopes made aware, approved pt to be seen in the day hospital today. Pt notified, verbalized understanding.

## 2022-09-11 NOTE — Telephone Encounter (Signed)
Caller & Relationship to patient:  MRN #  [id number]   Call Back Number:   Date of Last Office Visit: 08/27/2022     Date of Next Office Visit: 10/13/2022    Medication(s) to be Refilled: Percocet  Preferred Pharmacy:   ** Please notify patient to allow 48-72 hours to process** **Let patient know to contact pharmacy at the end of the day to make sure medication is ready. ** **If patient has not been seen in a year or longer, book an appointment **Advise to use MyChart for refill requests OR to contact their pharmacy

## 2022-09-11 NOTE — Telephone Encounter (Signed)
Pt called nurses station stating he cannot be at the day hospital at 11 am per Dr. Louis Meckel instructions. Pt informed that per provider pt is to go to the ED for care if unable to check in at the time indicated. Pt verbalized understanding, requests that refill of home medications be placed, pt states he is currently out of home meds. Pt states that if he can get his prescription today he will not need to go to to the ED. Dr. Hyman Hopes made aware of med request, sent in prescription to [facility] outpatient pharmacy. Pt notified that prescription has been refilled and he can pick it up today, pt verbalized understanding.

## 2022-09-14 ENCOUNTER — Emergency Department (HOSPITAL_COMMUNITY): Payer: Medicaid Other

## 2022-09-14 ENCOUNTER — Encounter (HOSPITAL_COMMUNITY): Payer: Self-pay

## 2022-09-14 ENCOUNTER — Inpatient Hospital Stay (HOSPITAL_COMMUNITY)
Admission: EM | Admit: 2022-09-14 | Discharge: 2022-09-17 | DRG: 811 | Disposition: A | Discharge status: Home / Self Care | Payer: Medicaid Other | Attending: Internal Medicine | Admitting: Internal Medicine

## 2022-09-14 DIAGNOSIS — Z91048 Other nonmedicinal substance allergy status: Secondary | ICD-10-CM

## 2022-09-14 DIAGNOSIS — E876 Hypokalemia: Secondary | ICD-10-CM | POA: Diagnosis present

## 2022-09-14 DIAGNOSIS — Z21 Asymptomatic human immunodeficiency virus [HIV] infection status: Secondary | ICD-10-CM | POA: Diagnosis present

## 2022-09-14 DIAGNOSIS — J9621 Acute and chronic respiratory failure with hypoxia: Secondary | ICD-10-CM | POA: Insufficient documentation

## 2022-09-14 DIAGNOSIS — Z79899 Other long term (current) drug therapy: Secondary | ICD-10-CM

## 2022-09-14 DIAGNOSIS — F419 Anxiety disorder, unspecified: Secondary | ICD-10-CM | POA: Diagnosis present

## 2022-09-14 DIAGNOSIS — D5700 Hb-SS disease with crisis, unspecified: Principal | ICD-10-CM | POA: Diagnosis present

## 2022-09-14 DIAGNOSIS — D638 Anemia in other chronic diseases classified elsewhere: Secondary | ICD-10-CM | POA: Diagnosis present

## 2022-09-14 DIAGNOSIS — F1120 Opioid dependence, uncomplicated: Secondary | ICD-10-CM | POA: Diagnosis present

## 2022-09-14 DIAGNOSIS — B20 Human immunodeficiency virus [HIV] disease: Secondary | ICD-10-CM | POA: Diagnosis present

## 2022-09-14 DIAGNOSIS — Z888 Allergy status to other drugs, medicaments and biological substances status: Secondary | ICD-10-CM

## 2022-09-14 DIAGNOSIS — G894 Chronic pain syndrome: Secondary | ICD-10-CM | POA: Diagnosis present

## 2022-09-14 DIAGNOSIS — Z832 Family history of diseases of the blood and blood-forming organs and certain disorders involving the immune mechanism: Secondary | ICD-10-CM

## 2022-09-14 DIAGNOSIS — Z86711 Personal history of pulmonary embolism: Secondary | ICD-10-CM | POA: Diagnosis present

## 2022-09-14 DIAGNOSIS — Z9981 Dependence on supplemental oxygen: Secondary | ICD-10-CM

## 2022-09-14 DIAGNOSIS — Z7901 Long term (current) use of anticoagulants: Secondary | ICD-10-CM

## 2022-09-14 LAB — CBC WITH DIFFERENTIAL/PLATELET
Abs Immature Granulocytes: 0.03 10*3/uL (ref 0.00–0.07)
Basophils Absolute: 0 10*3/uL (ref 0.0–0.1)
Basophils Relative: 0 %
Eosinophils Absolute: 0.3 10*3/uL (ref 0.0–0.5)
Eosinophils Relative: 3 %
HCT: 20.4 % — ABNORMAL LOW (ref 39.0–52.0)
Hemoglobin: 7.6 g/dL — ABNORMAL LOW (ref 13.0–17.0)
Immature Granulocytes: 0 %
Lymphocytes Relative: 62 %
Lymphs Abs: 5.8 10*3/uL — ABNORMAL HIGH (ref 0.7–4.0)
MCH: 39.8 pg — ABNORMAL HIGH (ref 26.0–34.0)
MCHC: 37.3 g/dL — ABNORMAL HIGH (ref 30.0–36.0)
MCV: 106.8 fL — ABNORMAL HIGH (ref 80.0–100.0)
Monocytes Absolute: 0.7 10*3/uL (ref 0.1–1.0)
Monocytes Relative: 7 %
Neutro Abs: 2.7 10*3/uL (ref 1.7–7.7)
Neutrophils Relative %: 28 %
Platelets: 267 10*3/uL (ref 150–400)
RBC: 1.91 MIL/uL — ABNORMAL LOW (ref 4.22–5.81)
RDW: 28.4 % — ABNORMAL HIGH (ref 11.5–15.5)
WBC: 9.6 10*3/uL (ref 4.0–10.5)
nRBC: 5.6 % — ABNORMAL HIGH (ref 0.0–0.2)

## 2022-09-14 LAB — TROPONIN I (HIGH SENSITIVITY): Troponin I (High Sensitivity): 5 ng/L (ref <18)

## 2022-09-14 LAB — COMPREHENSIVE METABOLIC PANEL
ALT: 10 U/L (ref 0–44)
AST: 17 U/L (ref 15–41)
Albumin: 3.5 g/dL (ref 3.5–5.0)
Alkaline Phosphatase: 57 U/L (ref 38–126)
Anion gap: 5 (ref 5–15)
BUN: 7 mg/dL (ref 6–20)
CO2: 25 mmol/L (ref 22–32)
Calcium: 8.1 mg/dL — ABNORMAL LOW (ref 8.9–10.3)
Chloride: 111 mmol/L (ref 98–111)
Creatinine, Ser: 0.72 mg/dL (ref 0.61–1.24)
GFR, Estimated: >60 mL/min (ref >60)
Glucose, Bld: 98 mg/dL (ref 70–99)
Potassium: 3.1 mmol/L — ABNORMAL LOW (ref 3.5–5.1)
Sodium: 141 mmol/L (ref 135–145)
Total Bilirubin: 8.8 mg/dL — ABNORMAL HIGH (ref 0.3–1.2)
Total Protein: 7.9 g/dL (ref 6.5–8.1)

## 2022-09-14 LAB — RETICULOCYTES
Immature Retic Fract: 26.6 % — ABNORMAL HIGH (ref 2.3–15.9)
RBC.: 1.7 MIL/uL — ABNORMAL LOW (ref 4.22–5.81)
Retic Count, Absolute: 573.5 10*3/uL — ABNORMAL HIGH (ref 19.0–186.0)
Retic Ct Pct: >30 % — ABNORMAL HIGH (ref 0.4–3.1)

## 2022-09-14 MED ORDER — DIPHENHYDRAMINE HCL 50 MG/ML IJ SOLN
25.0000 mg | Freq: Once | INTRAMUSCULAR | Status: DC
  Filled 2022-09-14: qty 1

## 2022-09-14 MED ORDER — DIPHENHYDRAMINE HCL 50 MG/ML IJ SOLN
25.0000 mg | Freq: Once | INTRAMUSCULAR | Status: AC
  Administered 2022-09-14: 25 mg via INTRAVENOUS
  Filled 2022-09-14: qty 1

## 2022-09-14 MED ORDER — POTASSIUM CHLORIDE CRYS ER 20 MEQ PO TBCR
40.0000 meq | EXTENDED_RELEASE_TABLET | Freq: Once | ORAL | Status: AC
  Administered 2022-09-15: 40 meq via ORAL
  Filled 2022-09-14: qty 2

## 2022-09-14 MED ORDER — HYDROMORPHONE HCL 2 MG/ML IJ SOLN
2.0000 mg | Freq: Once | INTRAMUSCULAR | Status: AC
  Administered 2022-09-14: 2 mg via INTRAVENOUS
  Filled 2022-09-14: qty 1

## 2022-09-14 NOTE — ED Triage Notes (Signed)
Patient reports having pain that began today in the back. Patient reports pain 10/10. Patient wears 4L [place] at home.

## 2022-09-14 NOTE — ED Provider Notes (Signed)
[place] AT [facility] Provider Note   CSN: [id number] Arrival date & time: 09/14/22  2057     History  Chief Complaint  Patient presents with   Sickle Cell Pain Crisis    Martin [name] is a 32 y.o. male.  Patient with a history of sickle cell disease, chronic oxygen dependence, PE on Eliquis, HIV presenting with typical sickle cell pain involving his low back for the past couple days.  States his home Percocet is not helping his pain.  Pain is in his low back with radiation to his bilateral hips and legs.  No fall or trauma.  Denies any fevers, chills, nausea, vomiting, chest pain or shortness of breath.  Does feel slightly worse with his shortness of breath compared to usual. Back pain is bilateral.  Not midline.  Denies any history of IV drug abuse or cancer.  No focal weakness, numbness or tingling.  No bowel or bladder incontinence.  Pain is typical of his sickle cell crises.  No fever or recent infection symptoms  The history is provided by the patient.  Sickle Cell Pain Crisis Associated symptoms: no chest pain, no congestion, no cough, no fatigue, no fever, no headaches, no nausea, no shortness of breath and no vomiting        Home Medications Prior to Admission medications   Medication Sig Start Date End Date Taking? Authorizing Provider  abacavir-dolutegravir-lamiVUDine (TRIUMEQ) 600-50-300 MG tablet Take 1 tablet by mouth at bedtime.    [provider]  apixaban (ELIQUIS) 5 MG TABS tablet Take 1 tablet (5 mg total) by mouth 2 (two) times daily. 12/02/21   Massie Maroon, [name]  folic acid (FOLVITE) 1 MG tablet Take 1 mg by mouth daily. 10/10/21   [provider]  gabapentin (NEURONTIN) 100 MG capsule Take 200 mg by mouth as needed (pain). 11/04/21   [provider]  hydroxyurea (HYDREA) 500 MG capsule Take 4 capsules (2,000 mg total) by mouth at bedtime. 10/07/20   Massie Maroon, [name]   oxyCODONE-acetaminophen (PERCOCET) 10-325 MG tablet Take 1 tablet by mouth every 4 hours as needed for pain. 09/11/22   Quentin Angst, MD      Allergies    Ketorolac tromethamine, Tape, and Wound dressing adhesive    Review of Systems   Review of Systems  Constitutional:  Negative for activity change, appetite change, fatigue and fever.  HENT:  Negative for congestion.   Respiratory:  Negative for cough, chest tightness and shortness of breath.   Cardiovascular:  Negative for chest pain.  Gastrointestinal:  Negative for abdominal pain, nausea and vomiting.  Genitourinary:  Negative for dysuria and hematuria.  Musculoskeletal:  Positive for arthralgias, back pain and myalgias.  Skin:  Negative for rash.  Neurological:  Positive for weakness. Negative for headaches.   all other systems are negative except as noted in the HPI and PMH.    Physical Exam Updated Vital Signs BP 116/80 (BP Location: Left Arm)   Pulse 93   Temp 98.3 F (36.8 C) (Oral)   Resp 18   Ht 6\' 3"  (1.905 m)   Wt 63.5 kg   SpO2 90%   BMI 17.50 kg/m  Physical Exam Vitals and nursing note reviewed.  Constitutional:      General: He is not in acute distress.    Appearance: He is well-developed.     Comments: Chronically ill-appearing  HENT:     Head: Normocephalic and atraumatic.  Mouth/Throat:     Pharynx: No oropharyngeal exudate.  Eyes:     Conjunctiva/sclera: Conjunctivae normal.     Pupils: Pupils are equal, round, and reactive to light.  Neck:     Comments: No meningismus. Cardiovascular:     Rate and Rhythm: Normal rate and regular rhythm.     Heart sounds: Normal heart sounds. No murmur heard. Pulmonary:     Effort: Pulmonary effort is normal. No respiratory distress.     Breath sounds: Normal breath sounds.  Abdominal:     Palpations: Abdomen is soft.     Tenderness: There is no abdominal tenderness. There is no guarding or rebound.  Musculoskeletal:        General: Tenderness  present. Normal range of motion.     Cervical back: Normal range of motion and neck supple.     Comments: Paraspinal lumbar tenderness bilaterally, no midline tenderness.  Full range of motion bilateral hips, knees and ankles without effusion or warmth.  5/5 strength in bilateral lower extremities. Ankle plantar and dorsiflexion intact. Great toe extension intact bilaterally. +2 DP and PT pulses.   Skin:    General: Skin is warm.  Neurological:     Mental Status: He is alert and oriented to person, place, and time.     Cranial Nerves: No cranial nerve deficit.     Motor: No abnormal muscle tone.     Coordination: Coordination normal.     Comments: No ataxia on finger to nose bilaterally. No pronator drift. 5/5 strength throughout. CN 2-12 intact.Equal grip strength. Sensation intact.   Psychiatric:        Behavior: Behavior normal.     ED Results / Procedures / Treatments   Labs (all labs ordered are listed, but only abnormal results are displayed) Labs Reviewed  COMPREHENSIVE METABOLIC PANEL - Abnormal; Notable for the following components:      Result Value   Potassium 3.1 (*)    Calcium 8.1 (*)    Total Bilirubin 8.8 (*)    All other components within normal limits  CBC WITH DIFFERENTIAL/PLATELET - Abnormal; Notable for the following components:   RBC 1.91 (*)    Hemoglobin 7.6 (*)    HCT 20.4 (*)    MCV 106.8 (*)    MCH 39.8 (*)    MCHC 37.3 (*)    RDW 28.4 (*)    nRBC 5.6 (*)    Lymphs Abs 5.8 (*)    All other components within normal limits  RETICULOCYTES - Abnormal; Notable for the following components:   Retic Ct Pct >30.0 (*)    RBC. 1.70 (*)    Retic Count, Absolute 573.5 (*)    Immature Retic Fract 26.6 (*)    All other components within normal limits  TROPONIN I (HIGH SENSITIVITY)  TROPONIN I (HIGH SENSITIVITY)    EKG EKG Interpretation  Date/Time:  Monday Sep 14 2022 21:16:03 EDT Ventricular Rate:  80 PR Interval:  174 QRS Duration: 88 QT  Interval:  419 QTC Calculation: 484 R Axis:   80 Text Interpretation: Sinus rhythm LVH by voltage Abnormal T, consider ischemia, anterior leads No significant change was found Confirmed by Glynn Octave [phone]) on 09/14/2022 9:20:33 PM  Radiology DG Chest 2 View  Result Date: 09/14/2022 CLINICAL DATA:  Sickle cell crisis. EXAM: CHEST - 2 VIEW COMPARISON:  June 28, 2022 FINDINGS: There is stable right-sided venous Port-A-Cath positioning. The heart size and mediastinal contours are within normal limits. Mild to moderate severity increased  interstitial lung markings are seen with mild prominence of the bilateral perihilar pulmonary vasculature. There is no evidence of a pleural effusion or pneumothorax. No acute osseous abnormalities are identified. IMPRESSION: Findings consistent with mild to moderate severity pulmonary vascular congestion. Electronically Signed   By: Aram Candela M.D.   On: 09/14/2022 21:44    Procedures Procedures    Medications Ordered in ED Medications  HYDROmorphone (DILAUDID) injection 2 mg (has no administration in time range)  diphenhydrAMINE (BENADRYL) injection 25 mg (has no administration in time range)    ED Course/ Medical Decision Making/ A&P                             Medical Decision Making Amount and/or Complexity of Data Reviewed Labs: ordered. Decision-making details documented in ED Course. Radiology: ordered and independent interpretation performed. Decision-making details documented in ED Course. ECG/medicine tests: ordered and independent interpretation performed. Decision-making details documented in ED Course.  Risk Prescription drug management.   Typical sickle cell pain involving low back and legs.  No fever.  No chest pain or shortness of breath.  No focal weakness, numbness or tingling.  Low concern for cord compression or cauda equina.  Patient treated per sickle cell protocol with pain medications per protocol.  Hemoglobin  is stable at 7.6.  Reticulocyte count is adequate.  Potassium is low at 3.1.  Chest x-ray concerning for vascular congestion.  No fever.  No white count.  Low suspicion for acute chest syndrome.  Pain remains uncontrolled after multiple doses of medications.  Patient requesting admission.  Low suspicion for pulmonary embolism, he states compliance with Eliquis.  No missed doses.  Respiratory status stable on his home nasal cannula oxygen.  Admission discussed with Dr. Loney Loh.       Final Clinical Impression(s) / ED Diagnoses Final diagnoses:  None    Rx / DC Orders ED Discharge Orders     None         Jerrol Helmers, Jeannett Senior, MD 09/15/22 0000

## 2022-09-15 ENCOUNTER — Encounter (HOSPITAL_COMMUNITY): Payer: Self-pay

## 2022-09-15 ENCOUNTER — Observation Stay (HOSPITAL_COMMUNITY): Payer: Medicaid Other

## 2022-09-15 ENCOUNTER — Emergency Department (HOSPITAL_COMMUNITY): Payer: Medicaid Other

## 2022-09-15 ENCOUNTER — Other Ambulatory Visit: Payer: Self-pay

## 2022-09-15 DIAGNOSIS — Z832 Family history of diseases of the blood and blood-forming organs and certain disorders involving the immune mechanism: Secondary | ICD-10-CM | POA: Diagnosis not present

## 2022-09-15 DIAGNOSIS — G894 Chronic pain syndrome: Secondary | ICD-10-CM | POA: Diagnosis present

## 2022-09-15 DIAGNOSIS — Z79899 Other long term (current) drug therapy: Secondary | ICD-10-CM | POA: Diagnosis not present

## 2022-09-15 DIAGNOSIS — D5700 Hb-SS disease with crisis, unspecified: Secondary | ICD-10-CM | POA: Diagnosis not present

## 2022-09-15 DIAGNOSIS — Z7901 Long term (current) use of anticoagulants: Secondary | ICD-10-CM | POA: Diagnosis not present

## 2022-09-15 DIAGNOSIS — I5021 Acute systolic (congestive) heart failure: Secondary | ICD-10-CM | POA: Diagnosis not present

## 2022-09-15 DIAGNOSIS — E876 Hypokalemia: Secondary | ICD-10-CM | POA: Diagnosis present

## 2022-09-15 DIAGNOSIS — Z9981 Dependence on supplemental oxygen: Secondary | ICD-10-CM | POA: Diagnosis not present

## 2022-09-15 DIAGNOSIS — D638 Anemia in other chronic diseases classified elsewhere: Secondary | ICD-10-CM | POA: Diagnosis present

## 2022-09-15 DIAGNOSIS — J9621 Acute and chronic respiratory failure with hypoxia: Secondary | ICD-10-CM | POA: Diagnosis present

## 2022-09-15 DIAGNOSIS — F419 Anxiety disorder, unspecified: Secondary | ICD-10-CM | POA: Diagnosis present

## 2022-09-15 DIAGNOSIS — Z86711 Personal history of pulmonary embolism: Secondary | ICD-10-CM | POA: Diagnosis not present

## 2022-09-15 DIAGNOSIS — Z888 Allergy status to other drugs, medicaments and biological substances status: Secondary | ICD-10-CM | POA: Diagnosis not present

## 2022-09-15 DIAGNOSIS — F1120 Opioid dependence, uncomplicated: Secondary | ICD-10-CM | POA: Diagnosis present

## 2022-09-15 DIAGNOSIS — B20 Human immunodeficiency virus [HIV] disease: Secondary | ICD-10-CM | POA: Diagnosis present

## 2022-09-15 DIAGNOSIS — Z91048 Other nonmedicinal substance allergy status: Secondary | ICD-10-CM | POA: Diagnosis not present

## 2022-09-15 LAB — ECHOCARDIOGRAM COMPLETE
AR max vel: 4.03 cm2
AV Area VTI: 4 cm2
AV Area mean vel: 3.92 cm2
AV Mean grad: 4 mmHg
AV Peak grad: 6.8 mmHg
Ao pk vel: 1.3 m/s
Area-P 1/2: 3.24 cm2
Calc EF: 57.8 %
Height: 75 in
MV VTI: 3.81 cm2
S' Lateral: 3.7 cm
Single Plane A2C EF: 56.9 %
Single Plane A4C EF: 58.8 %
Weight: 2377.44 oz

## 2022-09-15 LAB — CBC WITH DIFFERENTIAL/PLATELET
Abs Immature Granulocytes: 0.03 10*3/uL (ref 0.00–0.07)
Basophils Absolute: 0.1 10*3/uL (ref 0.0–0.1)
Basophils Relative: 1 %
Eosinophils Absolute: 0.4 10*3/uL (ref 0.0–0.5)
Eosinophils Relative: 4 %
HCT: 20.4 % — ABNORMAL LOW (ref 39.0–52.0)
Hemoglobin: 7.5 g/dL — ABNORMAL LOW (ref 13.0–17.0)
Immature Granulocytes: 0 %
Lymphocytes Relative: 60 %
Lymphs Abs: 6.2 10*3/uL — ABNORMAL HIGH (ref 0.7–4.0)
MCH: 39.1 pg — ABNORMAL HIGH (ref 26.0–34.0)
MCHC: 36.8 g/dL — ABNORMAL HIGH (ref 30.0–36.0)
MCV: 106.3 fL — ABNORMAL HIGH (ref 80.0–100.0)
Monocytes Absolute: 0.9 10*3/uL (ref 0.1–1.0)
Monocytes Relative: 9 %
Neutro Abs: 2.7 10*3/uL (ref 1.7–7.7)
Neutrophils Relative %: 26 %
Platelets: 233 10*3/uL (ref 150–400)
RBC: 1.92 MIL/uL — ABNORMAL LOW (ref 4.22–5.81)
RDW: 27 % — ABNORMAL HIGH (ref 11.5–15.5)
WBC: 10.3 10*3/uL (ref 4.0–10.5)
nRBC: 48.1 % — ABNORMAL HIGH (ref 0.0–0.2)

## 2022-09-15 LAB — BASIC METABOLIC PANEL
Anion gap: 5 (ref 5–15)
BUN: 7 mg/dL (ref 6–20)
CO2: 23 mmol/L (ref 22–32)
Calcium: 8.1 mg/dL — ABNORMAL LOW (ref 8.9–10.3)
Chloride: 110 mmol/L (ref 98–111)
Creatinine, Ser: 0.73 mg/dL (ref 0.61–1.24)
GFR, Estimated: >60 mL/min (ref >60)
Glucose, Bld: 85 mg/dL (ref 70–99)
Potassium: 3.9 mmol/L (ref 3.5–5.1)
Sodium: 138 mmol/L (ref 135–145)

## 2022-09-15 LAB — MAGNESIUM: Magnesium: 1.8 mg/dL (ref 1.7–2.4)

## 2022-09-15 LAB — TROPONIN I (HIGH SENSITIVITY): Troponin I (High Sensitivity): 5 ng/L (ref <18)

## 2022-09-15 LAB — GLUCOSE, CAPILLARY: Glucose-Capillary: 84 mg/dL (ref 70–99)

## 2022-09-15 MED ORDER — IOHEXOL 350 MG/ML SOLN
75.0000 mL | Freq: Once | INTRAVENOUS | Status: AC | PRN
  Administered 2022-09-15: 75 mL via INTRAVENOUS

## 2022-09-15 MED ORDER — ORAL CARE MOUTH RINSE: 15.0000 mL | OROMUCOSAL | Status: DC | PRN

## 2022-09-15 MED ORDER — DIPHENHYDRAMINE HCL 50 MG/ML IJ SOLN
12.5000 mg | Freq: Four times a day (QID) | INTRAMUSCULAR | Status: DC | PRN
  Administered 2022-09-15: 12.5 mg via INTRAVENOUS
  Filled 2022-09-15: qty 1

## 2022-09-15 MED ORDER — HYDROXYUREA 500 MG PO CAPS
2000.0000 mg | ORAL_CAPSULE | Freq: Every day | ORAL | Status: DC
  Administered 2022-09-15 – 2022-09-16 (×2): 2000 mg via ORAL
  Filled 2022-09-15 (×2): qty 4

## 2022-09-15 MED ORDER — ONDANSETRON HCL 4 MG/2ML IJ SOLN
4.0000 mg | INTRAMUSCULAR | Status: DC | PRN
  Administered 2022-09-16: 4 mg via INTRAVENOUS
  Filled 2022-09-15: qty 2

## 2022-09-15 MED ORDER — SODIUM CHLORIDE 0.9% FLUSH: 9.0000 mL | INTRAVENOUS | Status: DC | PRN

## 2022-09-15 MED ORDER — ABACAVIR-DOLUTEGRAVIR-LAMIVUD 600-50-300 MG PO TABS
1.0000 | ORAL_TABLET | Freq: Every day | ORAL | Status: DC
  Administered 2022-09-15 – 2022-09-16 (×2): 1 via ORAL
  Filled 2022-09-15 (×4): qty 1

## 2022-09-15 MED ORDER — DIPHENHYDRAMINE HCL 12.5 MG/5ML PO ELIX: 12.5000 mg | ORAL_SOLUTION | Freq: Four times a day (QID) | ORAL | Status: DC | PRN

## 2022-09-15 MED ORDER — DIPHENHYDRAMINE HCL 25 MG PO CAPS: 25.0000 mg | ORAL_CAPSULE | ORAL | Status: DC | PRN

## 2022-09-15 MED ORDER — APIXABAN 5 MG PO TABS
5.0000 mg | ORAL_TABLET | Freq: Two times a day (BID) | ORAL | Status: DC
  Administered 2022-09-15 – 2022-09-17 (×5): 5 mg via ORAL
  Filled 2022-09-15 (×5): qty 1

## 2022-09-15 MED ORDER — HYDROMORPHONE 1 MG/ML IV SOLN
INTRAVENOUS | Status: DC
  Administered 2022-09-15: 3 mg via INTRAVENOUS
  Administered 2022-09-15: 1 mg via INTRAVENOUS
  Administered 2022-09-15: 30 mg via INTRAVENOUS
  Filled 2022-09-15: qty 30

## 2022-09-15 MED ORDER — NALOXONE HCL 0.4 MG/ML IJ SOLN: 0.4000 mg | INTRAMUSCULAR | Status: DC | PRN

## 2022-09-15 MED ORDER — SENNOSIDES-DOCUSATE SODIUM 8.6-50 MG PO TABS
1.0000 | ORAL_TABLET | Freq: Two times a day (BID) | ORAL | Status: DC
  Administered 2022-09-15: 1 via ORAL
  Filled 2022-09-15 (×4): qty 1

## 2022-09-15 MED ORDER — CHLORHEXIDINE GLUCONATE CLOTH 2 % EX PADS
6.0000 | MEDICATED_PAD | Freq: Every day | CUTANEOUS | Status: DC
  Administered 2022-09-15 – 2022-09-17 (×3): 6 via TOPICAL

## 2022-09-15 MED ORDER — SODIUM CHLORIDE (PF) 0.9 % IJ SOLN
INTRAMUSCULAR | Status: AC
  Filled 2022-09-15: qty 50

## 2022-09-15 MED ORDER — ONDANSETRON HCL 4 MG PO TABS: 4.0000 mg | ORAL_TABLET | ORAL | Status: DC | PRN

## 2022-09-15 MED ORDER — HYDROMORPHONE 1 MG/ML IV SOLN
INTRAVENOUS | Status: DC
  Administered 2022-09-15: 7.5 mg via INTRAVENOUS
  Administered 2022-09-15: 6 mg via INTRAVENOUS
  Administered 2022-09-16: 5 mg via INTRAVENOUS
  Administered 2022-09-16: 2 mg via INTRAVENOUS
  Administered 2022-09-16: 4 mg via INTRAVENOUS
  Administered 2022-09-16: 3.5 mg via INTRAVENOUS
  Administered 2022-09-16 (×2): 30 mg via INTRAVENOUS
  Administered 2022-09-16: 4.5 mg via INTRAVENOUS
  Administered 2022-09-16: 4 mg via INTRAVENOUS
  Administered 2022-09-16: 9 mg via INTRAVENOUS
  Administered 2022-09-17: 7 mg via INTRAVENOUS
  Administered 2022-09-17: 30 mg via INTRAVENOUS
  Administered 2022-09-17: 6.5 mg via INTRAVENOUS
  Administered 2022-09-17: 9.5 mg via INTRAVENOUS
  Administered 2022-09-17: 6.5 mg via INTRAVENOUS
  Administered 2022-09-17: 7 mg via INTRAVENOUS
  Filled 2022-09-15 (×3): qty 30

## 2022-09-15 MED ORDER — SODIUM CHLORIDE 0.45 % IV SOLN: INTRAVENOUS | Status: AC

## 2022-09-15 MED ORDER — DIPHENHYDRAMINE HCL 50 MG/ML IJ SOLN
25.0000 mg | Freq: Once | INTRAMUSCULAR | Status: AC
  Administered 2022-09-15: 25 mg via INTRAVENOUS

## 2022-09-15 MED ORDER — POLYETHYLENE GLYCOL 3350 17 G PO PACK: 17.0000 g | PACK | Freq: Every day | ORAL | Status: DC | PRN

## 2022-09-15 NOTE — ED Notes (Signed)
Nurse received pt from triage on 5L nasal canula. Triage nurse called RT to come in and assess pt.

## 2022-09-15 NOTE — TOC Initial Note (Signed)
Transition of Care [facility]) - Initial/Assessment Note    Patient Details  Name: Martin [name] MRN: [id number] Date of Birth: 02/08/1991  Transition of Care [facility]) CM/SW Contact:    Lanier Clam, RN Phone Number: 09/15/2022, 2:37 PM  Clinical Narrative:   d/c plan home. Has [employer] home 02-has travel tank.                Expected Discharge Plan: Home/Self Care Barriers to Discharge: Continued Medical Work up   Patient Goals and CMS Choice Patient states their goals for this hospitalization and ongoing recovery are:: Home CMS Medicare.gov Compare Post Acute Care list provided to:: Patient Choice offered to / list presented to : Patient [place] ownership interest in [facility]provided to:: Patient    Expected Discharge Plan and Services     Post Acute Care Choice: Resumption of Svcs/PTA Provider Living arrangements for the past 2 months: Single Family Home                                      Prior Living Arrangements/Services Living arrangements for the past 2 months: Single Family Home Lives with:: Self              Current home services: DME ([employer] home 02-has travel tank)    Activities of Daily Living Home Assistive Devices/Equipment: None ADL Screening (condition at time of admission) Patient's cognitive ability adequate to safely complete daily activities?: Yes Is the patient deaf or have difficulty hearing?: No Does the patient have difficulty seeing, even when wearing glasses/contacts?: No Does the patient have difficulty concentrating, remembering, or making decisions?: No Patient able to express need for assistance with ADLs?: No Does the patient have difficulty dressing or bathing?: No Independently performs ADLs?: Yes (appropriate for developmental age) Does the patient have difficulty walking or climbing stairs?: No Weakness of Legs: None Weakness of Arms/Hands: None  Permission Sought/Granted                   Emotional Assessment              Admission diagnosis:  Sickle cell crisis (HCC) [D57.00] Sickle cell pain crisis (HCC) [D57.00] Patient Active Problem List   Diagnosis Date Noted   Anemia of chronic disease 07/13/2022   Hyperbilirubinemia 07/13/2022   Tinea capitis 07/29/2021   Sickle cell crisis (HCC) 06/24/2021   Bacteremia due to Enterococcus 08/29/2020   Acute chest syndrome (HCC) 08/26/2020   Hypoxia 07/09/2020   COVID-19 05/13/2020   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/18/2020   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   Abnormal penile discharge, without blood 02/22/2020   Leukocytosis 01/02/2020   Anxiety 11/27/2019   Proteinuria 11/27/2019   Acute on chronic respiratory failure with hypoxia (HCC) 11/16/2019   Chronic, continuous use of opioids 08/15/2019   Seasonal allergies 08/15/2019   Chest congestion    Sickle cell anemia with crisis (HCC) 08/04/2019   Chronic pain syndrome 08/04/2019   History of pulmonary embolus (PE) 08/04/2019   Single subsegmental pulmonary embolism without acute cor pulmonale (HCC) 02/10/2019   Acute chest syndrome due to sickle cell crisis (HCC) 02/10/2019   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) 03/09/2018   Bone pain 03/07/2018   Hip pain 03/07/2018   Acute bronchitis due to Streptococcus 02/21/2018   Heart murmur 02/03/2017   Sickle cell disease with crisis (HCC) 02/02/2017   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 [place] [address] Phone: [phone]  Fax: [phone]  [facility][id number] [place], [place] - [address] AT [facility] OF [address] [place] [address] Phone: [phone] Fax: [phone]  [facility] #[id number] - [place], [place] - [address] AT [facility] [address] [place] [address] [place] [address] Phone: [phone] Fax: [phone]  [facility] #[id number] - [place], [place] - [address] AT [facility] [address] [place] [address] Phone: [phone] Fax: [phone]  [facility] [address] [facility] - [place], [place] - [address] AT [address] [place] [address] Phone: [phone] Fax: [phone]  [id number] - [place], [place] - [address] AT [address] [place] [address] Phone: [phone] Fax: [phone]     Social Determinants of Health (SDOH) Social History: SDOH Screenings   Food Insecurity: No Food Insecurity (09/15/2022)  Housing: Patient Unable To Answer (09/15/2022)  Transportation Needs: No Transportation Needs (09/15/2022)  Utilities: Not At Risk (09/15/2022)  Depression (PHQ2-9): Low Risk  (03/31/2022)  Tobacco Use: Low Risk  (09/15/2022)   SDOH Interventions:     Readmission Risk Interventions    07/15/2022    1:02 PM 06/23/2022    9:44 AM 08/15/2021    1:48 PM  Readmission Risk Prevention Plan  Transportation Screening Complete Complete Complete  PCP or Specialist Appt within 5-7 Days  Complete   PCP or Specialist Appt within 3-5 Days Complete    Home Care Screening  Complete   Medication Review (RN CM)  Complete   HRI or Home Care Consult Complete    Social Work Consult for Recovery Care Planning/Counseling Complete    Palliative Care Screening Not Applicable    Medication Review Ocean[facility]) Complete  Complete  PCP or Specialist appointment within 3-5 days of discharge    Complete  HRI or Home Care Consult   Complete  SW Recovery Care/Counseling Consult   Complete  Palliative Care Screening   Not Applicable  Skilled Nursing Facility   Not Applicable

## 2022-09-15 NOTE — H&P (Signed)
History and Physical    Martin [name] NFA:[id number] DOB: 02-12-1991 DOA: 09/14/2022  PCP: Massie Maroon, [name]  Patient coming from: Home  Chief Complaint: Back pain  HPI: Martin [name] is a 32 y.o. male with medical history significant of HIV, sickle cell disease, chronic pain syndrome, opiate dependence and tolerance, history of PE on Eliquis, anemia of chronic disease, anxiety, chronic hypoxemic respiratory failure on home oxygen presented to ED complaining of typical sickle cell pain involving his lower back.  Afebrile.  Placed on 10 L HFNC.  Labs showing no leukocytosis, hemoglobin 7.6 (baseline 7-8), potassium 3.1, T. bili 8.8 (chronically elevated and stable), transaminases and alkaline phosphatase normal, absolute reticulocyte count elevated, troponin negative x 2. Chest x-ray showing mild to moderate severity pulmonary vascular congestion. Patient was given Benadryl, oral potassium 40 mg, and IV Dilaudid 2 mg x 2.  CTA chest ordered and currently pending.  TRH called to admit.  Patient reports 2-day history of severe lower back pain which is similar to the pain he has experienced during previous sickle cell pain crisis episodes.  He is taking Percocet 10 mg every 4 hours as needed at home but still in severe pain.  Denies any falls or trauma to his back.  Denies saddle anesthesia, bowel/bladder dysfunction, or lower extremity weakness/paresthesias.  Patient states he had a PE 3 years ago and takes Eliquis.  Also since after his PE, he has chronically been on home oxygen, uses 4 L as needed.  He denies history of any other lung problems.  Denies fevers, cough, shortness of breath, chest pain, nausea, vomiting, or abdominal pain.  Review of Systems:  Review of Systems  All other systems reviewed and are negative.   Past Medical History:  Diagnosis Date   Anxiety    HIV (human immunodeficiency virus infection) (HCC)    Proteinuria    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HCC)    Vitamin  D deficiency 10/2018    Past Surgical History:  Procedure Laterality Date   IR IMAGING GUIDED PORT INSERTION  08/29/2019   IR REMOVAL TUN ACCESS W/ PORT W/O FL MOD SED  08/30/2020   TEE WITHOUT CARDIOVERSION N/A 08/30/2020   Procedure: TRANSESOPHAGEAL ECHOCARDIOGRAM (TEE);  Surgeon: [name], MD;  Location: [facility] ENDOSCOPY;  Service: Cardiovascular;  Laterality: N/A;     reports that he has never smoked. He has never used smokeless tobacco. He reports current drug use. Drug: Marijuana. He reports that he does not drink alcohol.  Allergies  Allergen Reactions   Ketorolac Tromethamine Swelling and Other (See Comments)    Patient reports facial edema and left arm edema after administration.   Tape Rash and Other (See Comments)    PLEASE DO NOT USE THE CLEAR, THICK, "PLASTIC" TAPE- only paper tape is tolerated    Wound Dressing Adhesive Rash    Family History  Problem Relation Age of Onset   Sickle cell trait Mother    Sickle cell trait Father    Birth defects Maternal Grandmother    Birth defects Paternal Grandmother     Prior to Admission medications   Medication Sig Start Date End Date Taking? Authorizing Provider  abacavir-dolutegravir-lamiVUDine (TRIUMEQ) 600-50-300 MG tablet Take 1 tablet by mouth at bedtime.    [provider]  apixaban (ELIQUIS) 5 MG TABS tablet Take 1 tablet (5 mg total) by mouth 2 (two) times daily. 12/02/21   Massie Maroon, [name]  folic acid (FOLVITE) 1 MG tablet Take 1 mg by  mouth daily. 10/10/21   [provider]  gabapentin (NEURONTIN) 100 MG capsule Take 200 mg by mouth as needed (pain). 11/04/21   [provider]  hydroxyurea (HYDREA) 500 MG capsule Take 4 capsules (2,000 mg total) by mouth at bedtime. 10/07/20   Massie Maroon, [name]  oxyCODONE-acetaminophen (PERCOCET) 10-325 MG tablet Take 1 tablet by mouth every 4 hours as needed for pain. 09/11/22   Quentin Angst, MD    Physical Exam: Vitals:   09/14/22 2103  09/14/22 2105 09/14/22 2125 09/15/22 0015  BP: 116/80   109/74  Pulse: 93   72  Resp: 18   14  Temp: 98.3 F (36.8 C)     TempSrc: Oral     SpO2: 90%  96% 92%  Weight:  63.5 kg    Height:  6\' 3"  (1.905 m)      Physical Exam Vitals reviewed.  Constitutional:      General: He is not in acute distress. HENT:     Head: Normocephalic and atraumatic.  Eyes:     Extraocular Movements: Extraocular movements intact.  Cardiovascular:     Rate and Rhythm: Normal rate and regular rhythm.     Pulses: Normal pulses.  Pulmonary:     Effort: Pulmonary effort is normal. No respiratory distress.     Breath sounds: Normal breath sounds. No wheezing or rales.  Abdominal:     General: Bowel sounds are normal. There is no distension.     Palpations: Abdomen is soft.     Tenderness: There is no abdominal tenderness.  Musculoskeletal:     Cervical back: Normal range of motion.     Right lower leg: No edema.     Left lower leg: No edema.  Skin:    General: Skin is warm and dry.  Neurological:     General: No focal deficit present.     Mental Status: He is alert and oriented to person, place, and time.     Labs on Admission: I have personally reviewed following labs and imaging studies  CBC: Recent Labs  Lab 09/14/22 2223  WBC 9.6  NEUTROABS 2.7  HGB 7.6*  HCT 20.4*  MCV 106.8*  PLT 267   Basic Metabolic Panel: Recent Labs  Lab 09/14/22 2223  NA 141  K 3.1*  CL 111  CO2 25  GLUCOSE 98  BUN 7  CREATININE 0.72  CALCIUM 8.1*   GFR: Estimated Creatinine Clearance: 119.1 mL/min (by C-G formula based on SCr of 0.72 mg/dL). Liver Function Tests: Recent Labs  Lab 09/14/22 2223  AST 17  ALT 10  ALKPHOS 57  BILITOT 8.8*  PROT 7.9  ALBUMIN 3.5   No results for input(s): "LIPASE", "AMYLASE" in the last 168 hours. No results for input(s): "AMMONIA" in the last 168 hours. Coagulation Profile: No results for input(s): "INR", "PROTIME" in the last 168 hours. Cardiac  Enzymes: No results for input(s): "CKTOTAL", "CKMB", "CKMBINDEX", "TROPONINI" in the last 168 hours. BNP (last 3 results) No results for input(s): "PROBNP" in the last 8760 hours. HbA1C: No results for input(s): "HGBA1C" in the last 72 hours. CBG: No results for input(s): "GLUCAP" in the last 168 hours. Lipid Profile: No results for input(s): "CHOL", "HDL", "LDLCALC", "[name]", "CHOLHDL", "LDLDIRECT" in the last 72 hours. Thyroid Function Tests: No results for input(s): "TSH", "T4TOTAL", "FREET4", "T3FREE", "THYROIDAB" in the last 72 hours. Anemia Panel: Recent Labs    09/14/22 2224  RETICCTPCT >30.0*   Urine analysis:  Component Value Date/Time   COLORURINE YELLOW 02/20/2022 0827   APPEARANCEUR CLEAR 02/20/2022 0827   LABSPEC 1.006 02/20/2022 0827   PHURINE 5.0 02/20/2022 0827   GLUCOSEU NEGATIVE 02/20/2022 0827   HGBUR NEGATIVE 02/20/2022 0827   BILIRUBINUR NEGATIVE 02/20/2022 0827   BILIRUBINUR small (A) 03/18/2021 1218   BILIRUBINUR neg 11/27/2019 1149   KETONESUR NEGATIVE 02/20/2022 0827   PROTEINUR NEGATIVE 02/20/2022 0827   UROBILINOGEN 2.0 (A) 03/18/2021 1218   UROBILINOGEN 4.0 (H) 05/21/2009 1944   NITRITE NEGATIVE 02/20/2022 0827   LEUKOCYTESUR NEGATIVE 02/20/2022 0827    Radiological Exams on Admission: DG Chest 2 View  Result Date: 09/14/2022 CLINICAL DATA:  Sickle cell crisis. EXAM: CHEST - 2 VIEW COMPARISON:  June 28, 2022 FINDINGS: There is stable right-sided venous Port-A-Cath positioning. The heart size and mediastinal contours are within normal limits. Mild to moderate severity increased interstitial lung markings are seen with mild prominence of the bilateral perihilar pulmonary vasculature. There is no evidence of a pleural effusion or pneumothorax. No acute osseous abnormalities are identified. IMPRESSION: Findings consistent with mild to moderate severity pulmonary vascular congestion. Electronically Signed   By: Aram Candela M.D.   On: 09/14/2022  21:44    EKG: Independently reviewed. Sinus rhythm, T wave inversions in anterior leads. No significant change since prior tracing.   Assessment and Plan  Sickle cell pain crisis Patient presenting with 2-day history of severe low back pain which he thinks is consistent with the pain he has experienced during previous sickle cell pain crisis episodes.  No history of falls or trauma to his back.  No fever or leukocytosis.  No red flag symptoms.  No lower extremity weakness or numbness on exam.  Avoiding starting IV fluids at this time given pulmonary vascular congestion on chest x-ray, CTA chest pending.  Start Dilaudid PCA for pain control.  Toradol contraindicated due to allergies.  Monitor vital signs closely and reevaluate pain scale regularly.  Acute on chronic hypoxemic respiratory failure Patient is chronically on 4 L home oxygen PRN.  Upon arrival to the ED, he has not using his home oxygen and his sats were in the high 80s.  Informed by RN that he was placed on 5 L [place] without much improvement and subsequently placed on 10 L HFNC by respiratory therapist.  Patient with history of previous PE on Eliquis.  He is not endorsing any fevers, cough, shortness of breath, or chest pain.  Lungs clear on exam and no respiratory distress.  Chest x-ray showing mild to moderate severity pulmonary vascular congestion.  However, patient does not appear volume overloaded on exam and no previous history of heart failure.  Echo done 2 years ago was showing normal EF and trivial MVR.  ED physician ordered CTA chest which is currently pending.  Will order repeat echocardiogram.  Continue supplemental oxygen to keep oxygen saturation above 94%.  Anemia of chronic disease Hemoglobin 7.6 and at baseline.  No indication for blood transfusion at this time.  Continue to monitor H&H.  Mild hypokalemia Monitor potassium and magnesium levels, continue to replace as needed.  Elevated T. Bili Chronically elevated in the  setting of sickle cell disease and stable, remainder of LFTs normal.  Patient has no GI complaints.  HIV CD4 count 920 on 07/16/2022.  Continue antiretroviral therapy after pharmacy med rec is done.  History of previous PE Continue Eliquis after pharmacy med rec is done.  DVT prophylaxis: Continue Eliquis after pharmacy med rec is done. Code Status:  Full Code (discussed with the patient) Level of care: Progressive Care Unit Admission status: It is my clinical opinion that referral for OBSERVATION is reasonable and necessary in this patient based on the above information provided. The aforementioned taken together are felt to place the patient at high risk for further clinical deterioration. However, it is anticipated that the patient may be medically stable for discharge from the hospital within 24 to 48 hours.   John Giovanni MD [facility]  If 7PM-7AM, please contact night-coverage [email or web address]  09/15/2022, 12:50 AM

## 2022-09-15 NOTE — ED Notes (Addendum)
RT came in and placed pt on 8L of high flow oxygen. Pt sating at 100%

## 2022-09-15 NOTE — Progress Notes (Signed)
Subjective: Martin [name] is a 32 year old male with a medical history significant for sickle cell disease, chronic pain syndrome, HIV disease, history of PE on Eliquis, and anemia of chronic disease was admitted overnight for sickle cell pain crisis. Patient states that pain intensity has not improved despite IV Dilaudid PCA.  Pain is primarily to low back and lower extremities.  He denies any chest pain, shortness of breath, dizziness, headache, urinary symptoms, nausea, vomiting, or diarrhea.     Objective:  Vital signs in last 24 hours:  Vitals:   09/15/22 1414 09/15/22 1605 09/15/22 2005 09/15/22 2057  BP: 117/89  105/68   Pulse: 81  80   Resp:  13 20 20   Temp: 97.9 F (36.6 C)  98.8 F (37.1 C)   TempSrc: Oral  Oral   SpO2: 90%  (!) 89% (!) 89%  Weight:      Height:        Intake/Output from previous day:   Intake/Output Summary (Last 24 hours) at 09/15/2022 2246 Last data filed at 09/15/2022 2145 Gross per 24 hour  Intake 2198.09 ml  Output 2050 ml  Net 148.09 ml    Physical Exam: General: Alert, awake, oriented x3, in no acute distress.  HEENT: [place]/AT PEERL, EOMI Neck: Trachea midline,  no masses, no thyromegal,y no JVD, no carotid bruit OROPHARYNX:  Moist, No exudate/ erythema/lesions.  Heart: Regular rate and rhythm, without murmurs, rubs, gallops, PMI non-displaced, no heaves or thrills on palpation.  Lungs: Clear to auscultation, no wheezing or rhonchi noted. No increased vocal fremitus resonant to percussion  Abdomen: Soft, nontender, nondistended, positive bowel sounds, no masses no hepatosplenomegaly noted..  Neuro: No focal neurological deficits noted cranial nerves II through XII grossly intact. DTRs 2+ bilaterally upper and lower extremities. Strength 5 out of 5 in bilateral upper and lower extremities. Musculoskeletal: No warm swelling or erythema around joints, no spinal tenderness noted. Psychiatric: Patient alert and oriented x3, good insight and  cognition, good recent to remote recall. Lymph node survey: No cervical axillary or inguinal lymphadenopathy noted.  Lab Results:  Basic Metabolic Panel:    Component Value Date/Time   NA 138 09/15/2022 0928   NA 141 03/31/2022 1228   K 3.9 09/15/2022 0928   CL 110 09/15/2022 0928   CO2 23 09/15/2022 0928   BUN 7 09/15/2022 0928   BUN 10 03/31/2022 1228   CREATININE 0.73 09/15/2022 0928   GLUCOSE 85 09/15/2022 0928   CALCIUM 8.1 (L) 09/15/2022 0928   CBC:    Component Value Date/Time   WBC 10.3 09/15/2022 0928   HGB 7.5 (L) 09/15/2022 0928   HGB 9.5 (L) 03/31/2022 1228   HCT 20.4 (L) 09/15/2022 0928   HCT 26.1 (L) 03/31/2022 1228   PLT 233 09/15/2022 0928   PLT 318 03/31/2022 1228   MCV 106.3 (H) 09/15/2022 0928   MCV 107 (H) 03/31/2022 1228   NEUTROABS 2.7 09/15/2022 0928   NEUTROABS 3.5 03/31/2022 1228   LYMPHSABS 6.2 (H) 09/15/2022 0928   LYMPHSABS 6.1 (H) 03/31/2022 1228   MONOABS 0.9 09/15/2022 0928   EOSABS 0.4 09/15/2022 0928   EOSABS 0.1 03/31/2022 1228   BASOSABS 0.1 09/15/2022 0928   BASOSABS 0.1 03/31/2022 1228    No results found for this or any previous visit (from the past 240 hour(s)).  Studies/Results: ECHOCARDIOGRAM COMPLETE  Result Date: 09/15/2022    ECHOCARDIOGRAM REPORT   Patient Name:   Martin [name] Date of Exam: 09/15/2022 Medical Rec #:  [id number]           Height:       75.0 in Accession #:    [id number]          Weight:       148.6 lb Date of Birth:  10/28/1990           BSA:          1.934 m Patient Age:    31 years            BP:           116/76 mmHg Patient Gender: M                   HR:           68 bpm. Exam Location:  Inpatient Procedure: 2D Echo, Cardiac Doppler and Color Doppler Indications:    CHF - Acute Systolic  History:        Patient has prior history of Echocardiogram examinations, most                 recent 07/14/2021. Sickle cell, Signs/Symptoms:respiratory                 failure; Risk Factors:HIV+, hx of PE.   Sonographer:    Wallie Char Referring Phys: [id number] RATHORE  Sonographer Comments: Global longitudinal strain was attempted. IMPRESSIONS  1. Left ventricular ejection fraction, by estimation, is 55 to 60%. The left ventricle has normal function. The left ventricle has no regional wall motion abnormalities. Left ventricular diastolic parameters were normal. The average left ventricular global longitudinal strain is -22.0 %. The global longitudinal strain is normal.  2. Right ventricular systolic function is normal. The right ventricular size is normal. There is mildly elevated pulmonary artery systolic pressure. The estimated right ventricular systolic pressure is 43.0 mmHg.  3. Left atrial size was mildly dilated.  4. The mitral valve is grossly normal. Mild mitral valve regurgitation. No evidence of mitral stenosis.  5. The aortic valve is tricuspid. Aortic valve regurgitation is not visualized. No aortic stenosis is present.  6. The inferior vena cava is dilated in size with >50% respiratory variability, suggesting right atrial pressure of 8 mmHg. FINDINGS  Left Ventricle: Left ventricular ejection fraction, by estimation, is 55 to 60%. The left ventricle has normal function. The left ventricle has no regional wall motion abnormalities. The average left ventricular global longitudinal strain is -22.0 %. The global longitudinal strain is normal. The left ventricular internal cavity size was normal in size. There is no left ventricular hypertrophy. Left ventricular diastolic parameters were normal. Right Ventricle: The right ventricular size is normal. No increase in right ventricular wall thickness. Right ventricular systolic function is normal. There is mildly elevated pulmonary artery systolic pressure. The tricuspid regurgitant velocity is 2.96  m/s, and with an assumed right atrial pressure of 8 mmHg, the estimated right ventricular systolic pressure is 43.0 mmHg. Left Atrium: Left atrial size was  mildly dilated. Right Atrium: Right atrial size was normal in size. Pericardium: Trivial pericardial effusion is present. Mitral Valve: The mitral valve is grossly normal. Mild mitral valve regurgitation. No evidence of mitral valve stenosis. MV peak gradient, 4.7 mmHg. The mean mitral valve gradient is 3.0 mmHg. Tricuspid Valve: The tricuspid valve is grossly normal. Tricuspid valve regurgitation is mild . No evidence of tricuspid stenosis. Aortic Valve: The aortic valve is tricuspid. Aortic valve regurgitation is not visualized. No aortic stenosis is present. Aortic valve mean  gradient measures 4.0 mmHg. Aortic valve peak gradient measures 6.8 mmHg. Aortic valve area, by VTI measures 4.00 cm. Pulmonic Valve: The pulmonic valve was grossly normal. Pulmonic valve regurgitation is trivial. No evidence of pulmonic stenosis. Aorta: The aortic root and ascending aorta are structurally normal, with no evidence of dilitation. Venous: The inferior vena cava is dilated in size with greater than 50% respiratory variability, suggesting right atrial pressure of 8 mmHg. IAS/Shunts: The atrial septum is grossly normal.  LEFT VENTRICLE PLAX 2D LVIDd:         5.40 cm      Diastology LVIDs:         3.70 cm      LV e' medial:    10.60 cm/s LV PW:         1.00 cm      LV E/e' medial:  11.5 LV IVS:        0.90 cm      LV e' lateral:   12.80 cm/s LVOT diam:     2.30 cm      LV E/e' lateral: 9.5 LV SV:         120 LV SV Index:   62           2D Longitudinal Strain LVOT Area:     4.15 cm     2D Strain GLS Avg:     -22.0 %  LV Volumes (MOD) LV vol d, MOD A2C: 154.0 ml LV vol d, MOD A4C: 146.0 ml LV vol s, MOD A2C: 66.3 ml LV vol s, MOD A4C: 60.1 ml LV SV MOD A2C:     87.7 ml LV SV MOD A4C:     146.0 ml LV SV MOD BP:      88.7 ml RIGHT VENTRICLE RV Basal diam:  3.80 cm RV S prime:     15.00 cm/s TAPSE (M-mode): 2.4 cm LEFT ATRIUM             Index        RIGHT ATRIUM           Index LA diam:        4.60 cm 2.38 cm/m   RA Area:     19.80  cm LA Vol (A2C):   70.5 ml 36.45 ml/m  RA Volume:   57.40 ml  29.68 ml/m LA Vol (A4C):   78.3 ml 40.48 ml/m LA Biplane Vol: 75.9 ml 39.24 ml/m  AORTIC VALVE AV Area (Vmax):    4.03 cm AV Area (Vmean):   3.92 cm AV Area (VTI):     4.00 cm AV Vmax:           130.00 cm/s AV Vmean:          95.650 cm/s AV VTI:            0.300 m AV Peak Grad:      6.8 mmHg AV Mean Grad:      4.0 mmHg LVOT Vmax:         126.00 cm/s LVOT Vmean:        90.150 cm/s LVOT VTI:          0.289 m LVOT/AV VTI ratio: 0.96  AORTA Ao Root diam: 3.90 cm Ao Asc diam:  3.70 cm MITRAL VALVE                TRICUSPID VALVE MV Area (PHT): 3.24 cm     TR Peak grad:   35.0 mmHg MV Area VTI:  3.81 cm     TR Vmax:        296.00 cm/s MV Peak grad:  4.7 mmHg MV Mean grad:  3.0 mmHg     SHUNTS MV Vmax:       1.08 m/s     Systemic VTI:  0.29 m MV Vmean:      77.0 cm/s    Systemic Diam: 2.30 cm MV Decel Time: 234 msec MV E velocity: 122.00 cm/s MV A velocity: 105.00 cm/s MV E/A ratio:  1.16 Lennie Odor MD Electronically signed by Lennie Odor MD Signature Date/Time: 09/15/2022/11:36:52 AM    Final    CT Angio Chest PE W and/or Wo Contrast  Result Date: 09/15/2022 CLINICAL DATA:  History of sickle cell anemia and HIV presenting with back pain x1 day. EXAM: CT ANGIOGRAPHY CHEST WITH CONTRAST TECHNIQUE: Multidetector CT imaging of the chest was performed using the standard protocol during bolus administration of intravenous contrast. Multiplanar CT image reconstructions and MIPs were obtained to evaluate the vascular anatomy. RADIATION DOSE REDUCTION: This exam was performed according to the departmental dose-optimization program which includes automated exposure control, adjustment of the mA and/or kV according to patient size and/or use of iterative reconstruction technique. CONTRAST:  75mL OMNIPAQUE IOHEXOL 350 MG/ML SOLN COMPARISON:  August 03, 2021 FINDINGS: Cardiovascular: There is stable right-sided venous Port-A-Cath positioning. The thoracic  aorta is normal in appearance. Satisfactory opacification of the pulmonary arteries to the segmental level. No evidence of pulmonary embolism. Normal heart size. No pericardial effusion. Mediastinum/Nodes: Stable right hilar lymphadenopathy is seen. Thyroid gland, trachea, and esophagus demonstrate no significant findings. Lungs/Pleura: Stable, mild to moderate severity patchy areas of ground-glass appearing parenchyma are seen scattered throughout both lungs. Stable areas of focal scarring and/or atelectasis are seen within the bilateral upper lobes. Moderate severity the lingular and bilateral lower lobe scarring and/or atelectasis is also seen. No pleural effusion or pneumothorax is identified. Upper Abdomen: Surgical clips are seen within the gallbladder fossa. A small, densely calcified spleen is noted. Musculoskeletal: No chest wall abnormality. No acute or significant osseous findings. Review of the MIP images confirms the above findings. IMPRESSION: 1. No evidence of pulmonary embolism. 2. Stable, mild to moderate severity patchy areas of ground-glass appearing parenchyma scattered throughout both lungs which may represent sequelae associated with an atypical infection. 3. Stable areas of focal scarring and/or atelectasis within the bilateral upper lobes. 4. Moderate severity lingular and bilateral lower lobe scarring and/or atelectasis. 5. Stable right hilar lymphadenopathy. 6. Evidence of prior cholecystectomy. Electronically Signed   By: Aram Candela M.D.   On: 09/15/2022 01:11   DG Chest 2 View  Result Date: 09/14/2022 CLINICAL DATA:  Sickle cell crisis. EXAM: CHEST - 2 VIEW COMPARISON:  June 28, 2022 FINDINGS: There is stable right-sided venous Port-A-Cath positioning. The heart size and mediastinal contours are within normal limits. Mild to moderate severity increased interstitial lung markings are seen with mild prominence of the bilateral perihilar pulmonary vasculature. There is no  evidence of a pleural effusion or pneumothorax. No acute osseous abnormalities are identified. IMPRESSION: Findings consistent with mild to moderate severity pulmonary vascular congestion. Electronically Signed   By: Aram Candela M.D.   On: 09/14/2022 21:44    Medications: Scheduled Meds:  abacavir-dolutegravir-lamiVUDine  1 tablet Oral QHS   apixaban  5 mg Oral BID   Chlorhexidine Gluconate Cloth  6 each Topical Daily   HYDROmorphone   Intravenous Q4H   hydroxyurea  2,000 mg Oral QHS   senna-docusate  1 tablet Oral BID   Continuous Infusions: PRN Meds:.diphenhydrAMINE, naloxone **AND** sodium chloride flush, ondansetron **OR** ondansetron (ZOFRAN) IV, mouth rinse, polyethylene glycol  Consultants: none  Procedures: none  Antibiotics: none  Assessment/Plan: Principal Problem:   Sickle cell pain crisis (HCC) Active Problems:   Human immunodeficiency virus (HIV) disease (HCC)   History of pulmonary embolus (PE)   Acute on chronic respiratory failure with hypoxia (HCC)   Anemia of chronic disease  Sickle cell disease with pain crisis: Patient opiate tolerant, transition to opiate tolerant PCA with settings of 0.5 mg, 10-minute lockout, and 3 mg/h. Hold Toradol, listed allergy. Hold Percocet, will restart as pain intensity improves. Decrease IV fluids to [facility] Monitor vital signs very closely, reevaluate pain scale regularly, and supplemental oxygen as needed  Anemia of chronic disease: Patient's hemoglobin is 7.5 g/dL, slightly below patient's baseline.  Will continue to monitor closely, no clinical indication for blood transfusion at this time.  Labs in AM.  HIV disease: Patient followed by ID.  Continue home medications.  Patient's most recent CD4/CD8 was 07/16/2022.  History of PE: Continue Eliquis  Chronic pain syndrome: Restart home medications as pain intensity improves.  Code Status: Full Code Family Communication: N/A Disposition Plan: Not yet ready for  discharge Radin Raptis Rennis Petty  APRN, [name], [name] [facility] [address] [place], [place] [address] [phone]  If 7PM-7AM, please contact night-coverage.  09/15/2022, 10:46 PM  LOS: 0 days

## 2022-09-15 NOTE — ED Notes (Signed)
ED TO INPATIENT HANDOFF REPORT  ED Nurse Name and Phone #: Fortune Torosian  S Name/Age/Gender Martin [name] 32 y.o. male Room/Bed: WA16/WA16  Code Status   Code Status: Prior  Home/SNF/Other Home Patient oriented to: self, place, time, and situation Is this baseline? No   Triage Complete: Triage complete  Chief Complaint Sickle cell pain crisis [facility]) [D57.00]  Triage Note Patient reports having pain that began today in the back. Patient reports pain 10/10. Patient wears 4L [place] at home.    Allergies Allergies  Allergen Reactions   Ketorolac Tromethamine Swelling and Other (See Comments)    Patient reports facial edema and left arm edema after administration.   Tape Rash and Other (See Comments)    PLEASE DO NOT USE THE CLEAR, THICK, "PLASTIC" TAPE- only paper tape is tolerated    Wound Dressing Adhesive Rash    Level of Care/Admitting Diagnosis ED Disposition     ED Disposition  Admit   Condition  --   Comment  Hospital Area: [facility]  HOSPITAL [[id number]]  Level of Care: Progressive [102]  Admit to Progressive based on following criteria: RESPIRATORY PROBLEMS hypoxemic/hypercapnic respiratory failure that is responsive to NIPPV (BiPAP) or High Flow Nasal Cannula (6-80 lpm). Frequent assessment/intervention, no > Q2 hrs < Q4 hrs, to maintain oxygenation and pulmonary hygiene.  May place patient in observation at [facility] or [facility] if equivalent level of care is available:: Yes  Covid Evaluation: Asymptomatic - no recent exposure (last 10 days) testing not required  Diagnosis: Sickle cell pain crisis [facility]) [[id number]]  Admitting Physician: John Giovanni [[id number]]  Attending Physician: John Giovanni [[id number]]          B Medical/Surgery History Past Medical History:  Diagnosis Date   Anxiety    HIV (human immunodeficiency virus infection) (HCC)    Proteinuria    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HCC)    Vitamin D deficiency 10/2018   Past  Surgical History:  Procedure Laterality Date   IR IMAGING GUIDED PORT INSERTION  08/29/2019   IR REMOVAL TUN ACCESS W/ PORT W/O FL MOD SED  08/30/2020   TEE WITHOUT CARDIOVERSION N/A 08/30/2020   Procedure: TRANSESOPHAGEAL ECHOCARDIOGRAM (TEE);  Surgeon: [name], MD;  Location: [facility] ENDOSCOPY;  Service: Cardiovascular;  Laterality: N/A;     A IV Location/Drains/Wounds Patient Lines/Drains/Airways Status     Active Line/Drains/Airways     Name Placement date Placement time Site Days   Implanted Port Right Chest --  --  Chest  --            Intake/Output Last 24 hours No intake or output data in the 24 hours ending 09/15/22 0114  Labs/Imaging Results for orders placed or performed during the hospital encounter of 09/14/22 (from the past 48 hour(s))  Comprehensive metabolic panel     Status: Abnormal   Collection Time: 09/14/22 10:23 PM  Result Value Ref Range   Sodium 141 135 - 145 mmol/L   Potassium 3.1 (L) 3.5 - 5.1 mmol/L   Chloride 111 98 - 111 mmol/L   CO2 25 22 - 32 mmol/L   Glucose, Bld 98 70 - 99 mg/dL    Comment: Glucose reference range applies only to samples taken after fasting for at least 8 hours.   BUN 7 6 - 20 mg/dL   Creatinine, Ser 6.96 0.61 - 1.24 mg/dL   Calcium 8.1 (L) 8.9 - 10.3 mg/dL   Total Protein 7.9 6.5 - 8.1 g/dL   Albumin 3.5 3.5 -  5.0 g/dL   AST 17 15 - 41 U/L   ALT 10 0 - 44 U/L   Alkaline Phosphatase 57 38 - 126 U/L   Total Bilirubin 8.8 (H) 0.3 - 1.2 mg/dL   GFR, Estimated >16 >10 mL/min    Comment: (NOTE) Calculated using the CKD-EPI Creatinine Equation (2021)    Anion gap 5 5 - 15    Comment: Performed at [facility], [address], [place], [place] [address]  CBC with Differential     Status: Abnormal   Collection Time: 09/14/22 10:23 PM  Result Value Ref Range   WBC 9.6 4.0 - 10.5 K/uL   RBC 1.91 (L) 4.22 - 5.81 MIL/uL   Hemoglobin 7.6 (L) 13.0 - 17.0 g/dL   HCT 40.9 (L) 81.1 - 91.4 %   MCV 106.8 (H)  80.0 - 100.0 fL   MCH 39.8 (H) 26.0 - 34.0 pg   MCHC 37.3 (H) 30.0 - 36.0 g/dL   RDW 78.2 (H) 95.6 - 21.3 %   Platelets 267 150 - 400 K/uL   nRBC 5.6 (H) 0.0 - 0.2 %   Neutrophils Relative % 28 %   Neutro Abs 2.7 1.7 - 7.7 K/uL   Lymphocytes Relative 62 %   Lymphs Abs 5.8 (H) 0.7 - 4.0 K/uL   Monocytes Relative 7 %   Monocytes Absolute 0.7 0.1 - 1.0 K/uL   Eosinophils Relative 3 %   Eosinophils Absolute 0.3 0.0 - 0.5 K/uL   Basophils Relative 0 %   Basophils Absolute 0.0 0.0 - 0.1 K/uL   Immature Granulocytes 0 %   Abs Immature Granulocytes 0.03 0.00 - 0.07 K/uL   Agglutination PRESENT    Polychromasia PRESENT    Sickle Cells PRESENT    Target Cells PRESENT     Comment: Performed at [facility], [address], [place], [place] [address]  Reticulocytes     Status: Abnormal   Collection Time: 09/14/22 10:24 PM  Result Value Ref Range   Retic Ct Pct >30.0 (H) 0.4 - 3.1 %   RBC. 1.70 (L) 4.22 - 5.81 MIL/uL   Retic Count, Absolute 573.5 (H) 19.0 - 186.0 K/uL   Immature Retic Fract 26.6 (H) 2.3 - 15.9 %    Comment: Performed at [facility], [address], [place], [place] [address]  Troponin I (High Sensitivity)     Status: None   Collection Time: 09/14/22 10:24 PM  Result Value Ref Range   Troponin I (High Sensitivity) 5 <18 ng/L    Comment: (NOTE) Elevated high sensitivity troponin I (hsTnI) values and significant  changes across serial measurements may suggest ACS but many other  chronic and acute conditions are known to elevate hsTnI results.  Refer to the "Links" section for chest pain algorithms and additional  guidance. Performed at [facility], [address], [place], [place] [address]   Troponin I (High Sensitivity)     Status: None   Collection Time: 09/14/22 11:46 PM  Result Value Ref Range   Troponin I (High Sensitivity) 5 <18 ng/L    Comment: (NOTE) Elevated high sensitivity troponin I (hsTnI)  values and significant  changes across serial measurements may suggest ACS but many other  chronic and acute conditions are known to elevate hsTnI results.  Refer to the "Links" section for chest pain algorithms and additional  guidance. Performed at [facility], [address], [place], [place] [address]    CT  Angio Chest PE W and/or Wo Contrast  Result Date: 09/15/2022 CLINICAL DATA:  History of sickle cell anemia and HIV presenting with back pain x1 day. EXAM: CT ANGIOGRAPHY CHEST WITH CONTRAST TECHNIQUE: Multidetector CT imaging of the chest was performed using the standard protocol during bolus administration of intravenous contrast. Multiplanar CT image reconstructions and MIPs were obtained to evaluate the vascular anatomy. RADIATION DOSE REDUCTION: This exam was performed according to the departmental dose-optimization program which includes automated exposure control, adjustment of the mA and/or kV according to patient size and/or use of iterative reconstruction technique. CONTRAST:  75mL OMNIPAQUE IOHEXOL 350 MG/ML SOLN COMPARISON:  August 03, 2021 FINDINGS: Cardiovascular: There is stable right-sided venous Port-A-Cath positioning. The thoracic aorta is normal in appearance. Satisfactory opacification of the pulmonary arteries to the segmental level. No evidence of pulmonary embolism. Normal heart size. No pericardial effusion. Mediastinum/Nodes: Stable right hilar lymphadenopathy is seen. Thyroid gland, trachea, and esophagus demonstrate no significant findings. Lungs/Pleura: Stable, mild to moderate severity patchy areas of ground-glass appearing parenchyma are seen scattered throughout both lungs. Stable areas of focal scarring and/or atelectasis are seen within the bilateral upper lobes. Moderate severity the lingular and bilateral lower lobe scarring and/or atelectasis is also seen. No pleural effusion or pneumothorax is identified. Upper Abdomen: Surgical clips are seen  within the gallbladder fossa. A small, densely calcified spleen is noted. Musculoskeletal: No chest wall abnormality. No acute or significant osseous findings. Review of the MIP images confirms the above findings. IMPRESSION: 1. No evidence of pulmonary embolism. 2. Stable, mild to moderate severity patchy areas of ground-glass appearing parenchyma scattered throughout both lungs which may represent sequelae associated with an atypical infection. 3. Stable areas of focal scarring and/or atelectasis within the bilateral upper lobes. 4. Moderate severity lingular and bilateral lower lobe scarring and/or atelectasis. 5. Stable right hilar lymphadenopathy. 6. Evidence of prior cholecystectomy. Electronically Signed   By: Aram Candela M.D.   On: 09/15/2022 01:11   DG Chest 2 View  Result Date: 09/14/2022 CLINICAL DATA:  Sickle cell crisis. EXAM: CHEST - 2 VIEW COMPARISON:  June 28, 2022 FINDINGS: There is stable right-sided venous Port-A-Cath positioning. The heart size and mediastinal contours are within normal limits. Mild to moderate severity increased interstitial lung markings are seen with mild prominence of the bilateral perihilar pulmonary vasculature. There is no evidence of a pleural effusion or pneumothorax. No acute osseous abnormalities are identified. IMPRESSION: Findings consistent with mild to moderate severity pulmonary vascular congestion. Electronically Signed   By: Aram Candela M.D.   On: 09/14/2022 21:44    Pending Labs Unresulted Labs (From admission, onward)    None       Vitals/Pain Today's Vitals   09/14/22 2124 09/14/22 2125 09/14/22 2319 09/15/22 0015  BP:    109/74  Pulse:    72  Resp:    14  Temp:      TempSrc:      SpO2:  96%  92%  Weight:      Height:      PainSc: 10-Worst pain ever  9      Isolation Precautions No active isolations  Medications Medications  diphenhydrAMINE (BENADRYL) injection 25 mg (25 mg Intravenous Not Given 09/14/22 2351)   HYDROmorphone (DILAUDID) injection 2 mg (2 mg Intravenous Given 09/14/22 2210)  diphenhydrAMINE (BENADRYL) injection 25 mg (25 mg Intravenous Given 09/14/22 2210)  HYDROmorphone (DILAUDID) injection 2 mg (2 mg Intravenous Given 09/14/22 2322)  potassium chloride SA (KLOR-CON M) CR tablet 40  mEq (40 mEq Oral Given 09/15/22 0009)  diphenhydrAMINE (BENADRYL) injection 25 mg (25 mg Intravenous Given 09/15/22 0010)  iohexol (OMNIPAQUE) 350 MG/ML injection 75 mL (75 mLs Intravenous Contrast Given 09/15/22 0049)    Mobility walks     Focused Assessments Cardiac Assessment Handoff:    Lab Results  Component Value Date   CKTOTAL 17 (L) 08/29/2020   Lab Results  Component Value Date   DDIMER 2.47 (H) 02/09/2019   Does the Patient currently have chest pain? No    R Recommendations: See Admitting Provider Note  Report given to:   Additional Notes: AAOx4, amublates

## 2022-09-15 NOTE — Progress Notes (Addendum)
IVT consult for DBIV from pt's port. Pt appears to have been admitted from ED where port was accessed yesterday (5/27).  Upon assessment- fluids infusing without issue, GBR noted and dressing intact. Date missing from dressing and no biopatch in place. This RN returned after pt finished breakfast. Dressing change, biopatch placed.  Needle due to be changed 6/3.

## 2022-09-16 MED ORDER — SODIUM CHLORIDE 0.9 % IV SOLN: INTRAVENOUS | Status: DC | PRN

## 2022-09-16 MED ORDER — SODIUM CHLORIDE 0.9 % IV SOLN
12.5000 mg | Freq: Four times a day (QID) | INTRAVENOUS | Status: DC | PRN
  Administered 2022-09-16: 12.5 mg via INTRAVENOUS
  Filled 2022-09-16: qty 12.5

## 2022-09-16 MED ORDER — LIP MEDEX EX OINT
TOPICAL_OINTMENT | CUTANEOUS | Status: DC | PRN
  Administered 2022-09-16: 75 via TOPICAL
  Filled 2022-09-16: qty 7

## 2022-09-16 MED ORDER — BENZOCAINE 10 % MT GEL
Freq: Three times a day (TID) | OROMUCOSAL | Status: DC | PRN
  Administered 2022-09-16: 1 via OROMUCOSAL
  Filled 2022-09-16: qty 9.4

## 2022-09-17 DIAGNOSIS — D5700 Hb-SS disease with crisis, unspecified: Secondary | ICD-10-CM | POA: Diagnosis not present

## 2022-09-17 LAB — CBC WITH DIFFERENTIAL/PLATELET
Abs Immature Granulocytes: 0.03 10*3/uL (ref 0.00–0.07)
Basophils Absolute: 0 10*3/uL (ref 0.0–0.1)
Basophils Relative: 0 %
Eosinophils Absolute: 0.6 10*3/uL — ABNORMAL HIGH (ref 0.0–0.5)
Eosinophils Relative: 6 %
HCT: 22.3 % — ABNORMAL LOW (ref 39.0–52.0)
Hemoglobin: 8.2 g/dL — ABNORMAL LOW (ref 13.0–17.0)
Immature Granulocytes: 0 %
Lymphocytes Relative: 37 %
Lymphs Abs: 3.8 10*3/uL (ref 0.7–4.0)
MCH: 39 pg — ABNORMAL HIGH (ref 26.0–34.0)
MCHC: 36.8 g/dL — ABNORMAL HIGH (ref 30.0–36.0)
MCV: 106.2 fL — ABNORMAL HIGH (ref 80.0–100.0)
Monocytes Absolute: 1 10*3/uL (ref 0.1–1.0)
Monocytes Relative: 10 %
Neutro Abs: 5 10*3/uL (ref 1.7–7.7)
Neutrophils Relative %: 47 %
Platelets: 210 10*3/uL (ref 150–400)
RBC: 2.1 MIL/uL — ABNORMAL LOW (ref 4.22–5.81)
RDW: 24.5 % — ABNORMAL HIGH (ref 11.5–15.5)
WBC: 10.4 10*3/uL (ref 4.0–10.5)
nRBC: 22.2 % — ABNORMAL HIGH (ref 0.0–0.2)

## 2022-09-17 LAB — BASIC METABOLIC PANEL
Anion gap: 6 (ref 5–15)
BUN: 12 mg/dL (ref 6–20)
CO2: 29 mmol/L (ref 22–32)
Calcium: 8.3 mg/dL — ABNORMAL LOW (ref 8.9–10.3)
Chloride: 102 mmol/L (ref 98–111)
Creatinine, Ser: 0.97 mg/dL (ref 0.61–1.24)
GFR, Estimated: >60 mL/min (ref >60)
Glucose, Bld: 89 mg/dL (ref 70–99)
Potassium: 4.3 mmol/L (ref 3.5–5.1)
Sodium: 137 mmol/L (ref 135–145)

## 2022-09-17 LAB — LACTATE DEHYDROGENASE: LDH: 424 U/L — ABNORMAL HIGH (ref 98–192)

## 2022-09-17 MED ORDER — OXYCODONE HCL 5 MG PO TABS
5.0000 mg | ORAL_TABLET | Freq: Four times a day (QID) | ORAL | Status: DC | PRN
  Administered 2022-09-17 (×2): 5 mg via ORAL
  Filled 2022-09-17 (×2): qty 1

## 2022-09-17 MED ORDER — HEPARIN SOD (PORK) LOCK FLUSH 100 UNIT/ML IV SOLN
500.0000 [IU] | INTRAVENOUS | Status: DC | PRN
  Filled 2022-09-17: qty 5

## 2022-09-17 MED ORDER — OXYCODONE-ACETAMINOPHEN 5-325 MG PO TABS
1.0000 | ORAL_TABLET | Freq: Four times a day (QID) | ORAL | Status: DC | PRN
  Administered 2022-09-17 (×2): 1 via ORAL
  Filled 2022-09-17 (×2): qty 1

## 2022-09-17 NOTE — Discharge Summary (Signed)
Physician Discharge Summary  Martin [name] ZOX:[id number] DOB: Jul 02, 1990 DOA: 09/14/2022  PCP: Massie Maroon, [name]  Admit date: 09/14/2022  Discharge date: 09/17/2022  Discharge Diagnoses:  Principal Problem:   Sickle cell pain crisis (HCC) Active Problems:   Human immunodeficiency virus (HIV) disease (HCC)   History of pulmonary embolus (PE)   Acute on chronic respiratory failure with hypoxia (HCC)   Anemia of chronic disease   Discharge Condition: Stable  Disposition:   Follow-up Information     Massie Maroon, [name] Follow up.   Specialty: Family Medicine Contact information: [address] [place] [address] [phone]                Pt is discharged home in good condition and is to follow up with Massie Maroon, [name] this week to have labs evaluated. Martin [name] is instructed to increase activity slowly and balance with rest for the next few days, and use prescribed medication to complete treatment of pain  Diet: Regular Wt Readings from Last 3 Encounters:  09/15/22 67.4 kg  07/13/22 63.5 kg  06/28/22 68 kg    History of present illness:  Martin [name] is a 32 year old male with a medical history significant of HIV, sickle cell disease, chronic pain syndrome, opiate dependence and tolerance, history of PE on Eliquis, anemia of chronic disease, anxiety, chronic hypoxemic respiratory failure on home oxygen presented to the ED complaining of typical sickle cell pain involving his lower back.  Afebrile.  Placed on 10 L HFNC.  Labs show no leukocytosis, hemoglobin 7.6 (baseline 7-8), potassium 3.1, total bilirubin 8.8 (chronically elevated and stable), transaminases and alkaline phosphatase normal, absolute reticulocyte count elevated, troponin negative x 2.  Chest x-ray showing mild to moderate severity pulmonary vascular congestion.  Patient was given Benadryl, oral potassium 40 mg, and IV Dilaudid 2 mg x 2.  CT of chest ordered  no acute abnormalities.  TR[facility] called to admit.  Patient reports 2-day history of severe lower back pain, which is similar to the pain he is experienced during previous sickle cell pain crisis episodes.  He has taken Percocet 10 mg every 4 hours as needed at home, but still in severe pain.  Denies any falls or trauma to his back.  Denies saddle anesthesia, bowel bladder dysfunction, or lower extremity weakness/paresthesias.  Patient states he had a PE 3 years ago and takes Eliquis.  Also since after his PE, he has chronic been on home oxygen, uses 4 L as needed.  He denies history of any other lung problems.  Denies fevers, cough, shortness of breath, chest pain, nausea, vomiting, or abdominal pain.  Hospital Course:  Sickle cell disease with pain crisis: Patient was admitted for sickle cell pain crisis and managed appropriately with IVF, IV Dilaudid via PCA l, as well as other adjunct therapies per sickle cell pain management protocols.  IV Dilaudid PCA was weaned appropriately and patient was transition to his home medications that include Percocet 10-325 mg every 4 hours as needed for severe breakthrough pain.  Patient will follow-up with his PCP for medication management. Patient's hemoglobin is stable and consistent with his baseline.  He has not required transfusion during admission.  Patient will follow-up with his PCP in 2 weeks to repeat CBC and CMP.  Patient was therefore discharged home today in a hemodynamically stable condition.   Martin [name] will follow-up with PCP within 2 weeks of this discharge. Martin [name] was counseled extensively about nonpharmacologic means  of pain management, patient verbalized understanding and was appreciative of  the care received during this admission.   We discussed the need for good hydration, monitoring of hydration status, avoidance of heat, cold, stress, and infection triggers. We discussed the need to be adherent with taking Hydrea and other home medications.  Patient was reminded of the need to seek medical attention immediately if any symptom of bleeding, anemia, or infection occurs.  Discharge Exam: Vitals:   09/17/22 1503 09/17/22 1616  BP: 119/83   Pulse: 88   Resp: 20 20  Temp: 98.4 F (36.9 C)   SpO2: (!) 88% (!) 89%   Vitals:   09/17/22 1112 09/17/22 1300 09/17/22 1503 09/17/22 1616  BP:   119/83   Pulse:   88   Resp: 20 18 20 20   Temp:   98.4 F (36.9 C)   TempSrc:   Oral   SpO2: 90% 90% (!) 88% (!) 89%  Weight:      Height:        General appearance : Awake, alert, not in any distress. Speech Clear. Not toxic looking HEENT: Atraumatic and Normocephalic, pupils equally reactive to light and accomodation Neck: Supple, no JVD. No cervical lymphadenopathy.  Chest: Good air entry bilaterally, no added sounds  CVS: S1 S2 regular, no murmurs.  Abdomen: Bowel sounds present, Non tender and not distended with no gaurding, rigidity or rebound. Extremities: B/L Lower Ext shows no edema, both legs are warm to touch Neurology: Awake alert, and oriented X 3, CN II-XII intact, Non focal Skin: No Rash  Discharge Instructions  Discharge Instructions     Discharge patient   Complete by: As directed    Discharge disposition: 01-Home or Self Care   Discharge patient date: 09/17/2022      Allergies as of 09/17/2022       Reactions   Ketorolac Tromethamine Swelling, Other (See Comments)   Patient reports facial edema and left arm edema after administration.   Tape Rash, Other (See Comments)   PLEASE DO NOT USE THE CLEAR, THICK, "PLASTIC" TAPE- only paper tape is tolerated    Wound Dressing Adhesive Rash        Medication List     TAKE these medications    Eliquis 5 MG Tabs tablet Generic drug: apixaban Take 1 tablet (5 mg total) by mouth 2 (two) times daily.   folic acid 1 MG tablet Commonly known as: FOLVITE Take 1 mg by mouth daily.   gabapentin 100 MG capsule Commonly known as: NEURONTIN Take 200 mg by mouth as  needed (pain).   hydroxyurea 500 MG capsule Commonly known as: HYDREA Take 4 capsules (2,000 mg total) by mouth at bedtime.   naproxen 500 MG tablet Commonly known as: NAPROSYN Take 500 mg by mouth 2 (two) times daily with a meal.   oxyCODONE-acetaminophen 10-325 MG tablet Commonly known as: Percocet Take 1 tablet by mouth every 4 hours as needed for pain.   Triumeq 600-50-300 MG tablet Generic drug: abacavir-dolutegravir-lamiVUDine Take 1 tablet by mouth at bedtime.        The results of significant diagnostics from this hospitalization (including imaging, microbiology, ancillary and laboratory) are listed below for reference.    Significant Diagnostic Studies: ECHOCARDIOGRAM COMPLETE  Result Date: 09/15/2022    ECHOCARDIOGRAM REPORT   Patient Name:   HALO WILTFONG Date of Exam: 09/15/2022 Medical Rec #:  [id number]           Height:  75.0 in Accession #:    [id number]          Weight:       148.6 lb Date of Birth:  01/14/1991           BSA:          1.934 m Patient Age:    31 years            BP:           116/76 mmHg Patient Gender: M                   HR:           68 bpm. Exam Location:  Inpatient Procedure: 2D Echo, Cardiac Doppler and Color Doppler Indications:    CHF - Acute Systolic  History:        Patient has prior history of Echocardiogram examinations, most                 recent 07/14/2021. Sickle cell, Signs/Symptoms:respiratory                 failure; Risk Factors:HIV+, hx of PE.  Sonographer:    Wallie Char Referring Phys: [id number] OZHYQMVHQ RATHORE  Sonographer Comments: Global longitudinal strain was attempted. IMPRESSIONS  1. Left ventricular ejection fraction, by estimation, is 55 to 60%. The left ventricle has normal function. The left ventricle has no regional wall motion abnormalities. Left ventricular diastolic parameters were normal. The average left ventricular global longitudinal strain is -22.0 %. The global longitudinal strain is normal.  2. Right  ventricular systolic function is normal. The right ventricular size is normal. There is mildly elevated pulmonary artery systolic pressure. The estimated right ventricular systolic pressure is 43.0 mmHg.  3. Left atrial size was mildly dilated.  4. The mitral valve is grossly normal. Mild mitral valve regurgitation. No evidence of mitral stenosis.  5. The aortic valve is tricuspid. Aortic valve regurgitation is not visualized. No aortic stenosis is present.  6. The inferior vena cava is dilated in size with >50% respiratory variability, suggesting right atrial pressure of 8 mmHg. FINDINGS  Left Ventricle: Left ventricular ejection fraction, by estimation, is 55 to 60%. The left ventricle has normal function. The left ventricle has no regional wall motion abnormalities. The average left ventricular global longitudinal strain is -22.0 %. The global longitudinal strain is normal. The left ventricular internal cavity size was normal in size. There is no left ventricular hypertrophy. Left ventricular diastolic parameters were normal. Right Ventricle: The right ventricular size is normal. No increase in right ventricular wall thickness. Right ventricular systolic function is normal. There is mildly elevated pulmonary artery systolic pressure. The tricuspid regurgitant velocity is 2.96  m/s, and with an assumed right atrial pressure of 8 mmHg, the estimated right ventricular systolic pressure is 43.0 mmHg. Left Atrium: Left atrial size was mildly dilated. Right Atrium: Right atrial size was normal in size. Pericardium: Trivial pericardial effusion is present. Mitral Valve: The mitral valve is grossly normal. Mild mitral valve regurgitation. No evidence of mitral valve stenosis. MV peak gradient, 4.7 mmHg. The mean mitral valve gradient is 3.0 mmHg. Tricuspid Valve: The tricuspid valve is grossly normal. Tricuspid valve regurgitation is mild . No evidence of tricuspid stenosis. Aortic Valve: The aortic valve is tricuspid.  Aortic valve regurgitation is not visualized. No aortic stenosis is present. Aortic valve mean gradient measures 4.0 mmHg. Aortic valve peak gradient measures 6.8 mmHg. Aortic valve area, by VTI measures 4.00  cm. Pulmonic Valve: The pulmonic valve was grossly normal. Pulmonic valve regurgitation is trivial. No evidence of pulmonic stenosis. Aorta: The aortic root and ascending aorta are structurally normal, with no evidence of dilitation. Venous: The inferior vena cava is dilated in size with greater than 50% respiratory variability, suggesting right atrial pressure of 8 mmHg. IAS/Shunts: The atrial septum is grossly normal.  LEFT VENTRICLE PLAX 2D LVIDd:         5.40 cm      Diastology LVIDs:         3.70 cm      LV e' medial:    10.60 cm/s LV PW:         1.00 cm      LV E/e' medial:  11.5 LV IVS:        0.90 cm      LV e' lateral:   12.80 cm/s LVOT diam:     2.30 cm      LV E/e' lateral: 9.5 LV SV:         120 LV SV Index:   62           2D Longitudinal Strain LVOT Area:     4.15 cm     2D Strain GLS Avg:     -22.0 %  LV Volumes (MOD) LV vol d, MOD A2C: 154.0 ml LV vol d, MOD A4C: 146.0 ml LV vol s, MOD A2C: 66.3 ml LV vol s, MOD A4C: 60.1 ml LV SV MOD A2C:     87.7 ml LV SV MOD A4C:     146.0 ml LV SV MOD BP:      88.7 ml RIGHT VENTRICLE RV Basal diam:  3.80 cm RV S prime:     15.00 cm/s TAPSE (M-mode): 2.4 cm LEFT ATRIUM             Index        RIGHT ATRIUM           Index LA diam:        4.60 cm 2.38 cm/m   RA Area:     19.80 cm LA Vol (A2C):   70.5 ml 36.45 ml/m  RA Volume:   57.40 ml  29.68 ml/m LA Vol (A4C):   78.3 ml 40.48 ml/m LA Biplane Vol: 75.9 ml 39.24 ml/m  AORTIC VALVE AV Area (Vmax):    4.03 cm AV Area (Vmean):   3.92 cm AV Area (VTI):     4.00 cm AV Vmax:           130.00 cm/s AV Vmean:          95.650 cm/s AV VTI:            0.300 m AV Peak Grad:      6.8 mmHg AV Mean Grad:      4.0 mmHg LVOT Vmax:         126.00 cm/s LVOT Vmean:        90.150 cm/s LVOT VTI:          0.289 m LVOT/AV  VTI ratio: 0.96  AORTA Ao Root diam: 3.90 cm Ao Asc diam:  3.70 cm MITRAL VALVE                TRICUSPID VALVE MV Area (PHT): 3.24 cm     TR Peak grad:   35.0 mmHg MV Area VTI:   3.81 cm     TR Vmax:        296.00 cm/s  MV Peak grad:  4.7 mmHg MV Mean grad:  3.0 mmHg     SHUNTS MV Vmax:       1.08 m/s     Systemic VTI:  0.29 m MV Vmean:      77.0 cm/s    Systemic Diam: 2.30 cm MV Decel Time: 234 msec MV E velocity: 122.00 cm/s MV A velocity: 105.00 cm/s MV E/A ratio:  1.16 Lennie Odor MD Electronically signed by Lennie Odor MD Signature Date/Time: 09/15/2022/11:36:52 AM    Final    CT Angio Chest PE W and/or Wo Contrast  Result Date: 09/15/2022 CLINICAL DATA:  History of sickle cell anemia and HIV presenting with back pain x1 day. EXAM: CT ANGIOGRAPHY CHEST WITH CONTRAST TECHNIQUE: Multidetector CT imaging of the chest was performed using the standard protocol during bolus administration of intravenous contrast. Multiplanar CT image reconstructions and MIPs were obtained to evaluate the vascular anatomy. RADIATION DOSE REDUCTION: This exam was performed according to the departmental dose-optimization program which includes automated exposure control, adjustment of the mA and/or kV according to patient size and/or use of iterative reconstruction technique. CONTRAST:  75mL OMNIPAQUE IOHEXOL 350 MG/ML SOLN COMPARISON:  August 03, 2021 FINDINGS: Cardiovascular: There is stable right-sided venous Port-A-Cath positioning. The thoracic aorta is normal in appearance. Satisfactory opacification of the pulmonary arteries to the segmental level. No evidence of pulmonary embolism. Normal heart size. No pericardial effusion. Mediastinum/Nodes: Stable right hilar lymphadenopathy is seen. Thyroid gland, trachea, and esophagus demonstrate no significant findings. Lungs/Pleura: Stable, mild to moderate severity patchy areas of ground-glass appearing parenchyma are seen scattered throughout both lungs. Stable areas of focal  scarring and/or atelectasis are seen within the bilateral upper lobes. Moderate severity the lingular and bilateral lower lobe scarring and/or atelectasis is also seen. No pleural effusion or pneumothorax is identified. Upper Abdomen: Surgical clips are seen within the gallbladder fossa. A small, densely calcified spleen is noted. Musculoskeletal: No chest wall abnormality. No acute or significant osseous findings. Review of the MIP images confirms the above findings. IMPRESSION: 1. No evidence of pulmonary embolism. 2. Stable, mild to moderate severity patchy areas of ground-glass appearing parenchyma scattered throughout both lungs which may represent sequelae associated with an atypical infection. 3. Stable areas of focal scarring and/or atelectasis within the bilateral upper lobes. 4. Moderate severity lingular and bilateral lower lobe scarring and/or atelectasis. 5. Stable right hilar lymphadenopathy. 6. Evidence of prior cholecystectomy. Electronically Signed   By: Aram Candela M.D.   On: 09/15/2022 01:11   DG Chest 2 View  Result Date: 09/14/2022 CLINICAL DATA:  Sickle cell crisis. EXAM: CHEST - 2 VIEW COMPARISON:  June 28, 2022 FINDINGS: There is stable right-sided venous Port-A-Cath positioning. The heart size and mediastinal contours are within normal limits. Mild to moderate severity increased interstitial lung markings are seen with mild prominence of the bilateral perihilar pulmonary vasculature. There is no evidence of a pleural effusion or pneumothorax. No acute osseous abnormalities are identified. IMPRESSION: Findings consistent with mild to moderate severity pulmonary vascular congestion. Electronically Signed   By: Aram Candela M.D.   On: 09/14/2022 21:44    Microbiology: No results found for this or any previous visit (from the past 240 hour(s)).   Labs: Basic Metabolic Panel: Recent Labs  Lab 09/14/22 2223 09/15/22 0928 09/17/22 0602  NA 141 138 137  K 3.1* 3.9 4.3   CL 111 110 102  CO2 25 23 29   GLUCOSE 98 85 89  BUN 7 7 12   CREATININE  0.72 0.73 0.97  CALCIUM 8.1* 8.1* 8.3*  MG  --  1.8  --    Liver Function Tests: Recent Labs  Lab 09/14/22 2223  AST 17  ALT 10  ALKPHOS 57  BILITOT 8.8*  PROT 7.9  ALBUMIN 3.5   No results for input(s): "LIPASE", "AMYLASE" in the last 168 hours. No results for input(s): "AMMONIA" in the last 168 hours. CBC: Recent Labs  Lab 09/14/22 2223 09/15/22 0928 09/17/22 0602  WBC 9.6 10.3 10.4  NEUTROABS 2.7 2.7 5.0  HGB 7.6* 7.5* 8.2*  HCT 20.4* 20.4* 22.3*  MCV 106.8* 106.3* 106.2*  PLT 267 233 210   Cardiac Enzymes: No results for input(s): "CKTOTAL", "CKMB", "CKMBINDEX", "TROPONINI" in the last 168 hours. BNP: Invalid input(s): "POCBNP" CBG: Recent Labs  Lab 09/15/22 0757  GLUCAP 84    Time coordinating discharge: 30 minutes  Signed:  [name]  APRN, [name], [name] Patient Care [facility] [address] [place], [place] [address] [phone]  [facility] 09/17/2022, 4:37 PM

## 2022-09-21 ENCOUNTER — Telehealth: Payer: Self-pay

## 2022-09-21 NOTE — Transitions of Care (Post Inpatient/ED Visit) (Unsigned)
   09/21/2022  Name: Martin [name] MRN: [id number] DOB: [birth year]  Today's TOC FU Call Status:    Attempted to reach the patient regarding the most recent Inpatient/ED visit.  Follow Up Plan: Additional outreach attempts will be made to reach the patient to complete the Transitions of Care (Post Inpatient/ED visit) call.   Signature  Woodfin Ganja LPN [facility] Nurse Health Advisor Direct Dial [phone]

## 2022-09-22 ENCOUNTER — Telehealth (HOSPITAL_COMMUNITY): Payer: Self-pay | Admitting: *Deleted

## 2022-09-22 ENCOUNTER — Inpatient Hospital Stay: Payer: Self-pay | Admitting: Family Medicine

## 2022-09-22 ENCOUNTER — Non-Acute Institutional Stay (HOSPITAL_COMMUNITY)
Admission: AD | Admit: 2022-09-22 | Discharge: 2022-09-22 | Disposition: A | Discharge status: Home / Self Care | Payer: Medicaid Other | Source: Ambulatory Visit | Attending: Internal Medicine | Admitting: Internal Medicine

## 2022-09-22 DIAGNOSIS — G894 Chronic pain syndrome: Secondary | ICD-10-CM | POA: Insufficient documentation

## 2022-09-22 DIAGNOSIS — Z21 Asymptomatic human immunodeficiency virus [HIV] infection status: Secondary | ICD-10-CM | POA: Insufficient documentation

## 2022-09-22 DIAGNOSIS — Z791 Long term (current) use of non-steroidal anti-inflammatories (NSAID): Secondary | ICD-10-CM | POA: Insufficient documentation

## 2022-09-22 DIAGNOSIS — F1120 Opioid dependence, uncomplicated: Secondary | ICD-10-CM | POA: Insufficient documentation

## 2022-09-22 DIAGNOSIS — Z7901 Long term (current) use of anticoagulants: Secondary | ICD-10-CM | POA: Diagnosis not present

## 2022-09-22 DIAGNOSIS — F1290 Cannabis use, unspecified, uncomplicated: Secondary | ICD-10-CM | POA: Insufficient documentation

## 2022-09-22 DIAGNOSIS — Z79899 Other long term (current) drug therapy: Secondary | ICD-10-CM | POA: Insufficient documentation

## 2022-09-22 DIAGNOSIS — Z86711 Personal history of pulmonary embolism: Secondary | ICD-10-CM | POA: Diagnosis not present

## 2022-09-22 DIAGNOSIS — D638 Anemia in other chronic diseases classified elsewhere: Secondary | ICD-10-CM | POA: Insufficient documentation

## 2022-09-22 DIAGNOSIS — D5700 Hb-SS disease with crisis, unspecified: Secondary | ICD-10-CM | POA: Diagnosis present

## 2022-09-22 DIAGNOSIS — Z79624 Long term (current) use of inhibitors of nucleotide synthesis: Secondary | ICD-10-CM | POA: Diagnosis not present

## 2022-09-22 LAB — CBC WITH DIFFERENTIAL/PLATELET
Abs Immature Granulocytes: 0.2 10*3/uL — ABNORMAL HIGH (ref 0.00–0.07)
Basophils Absolute: 0.1 10*3/uL (ref 0.0–0.1)
Basophils Relative: 1 %
Eosinophils Absolute: 0.3 10*3/uL (ref 0.0–0.5)
Eosinophils Relative: 3 %
HCT: 20 % — ABNORMAL LOW (ref 39.0–52.0)
Hemoglobin: 7.4 g/dL — ABNORMAL LOW (ref 13.0–17.0)
Immature Granulocytes: 2 %
Lymphocytes Relative: 56 %
Lymphs Abs: 7.2 10*3/uL — ABNORMAL HIGH (ref 0.7–4.0)
MCH: 38.9 pg — ABNORMAL HIGH (ref 26.0–34.0)
MCHC: 37 g/dL — ABNORMAL HIGH (ref 30.0–36.0)
MCV: 105.3 fL — ABNORMAL HIGH (ref 80.0–100.0)
Monocytes Absolute: 1.4 10*3/uL — ABNORMAL HIGH (ref 0.1–1.0)
Monocytes Relative: 11 %
Neutro Abs: 3.5 10*3/uL (ref 1.7–7.7)
Neutrophils Relative %: 27 %
Platelets: 267 10*3/uL (ref 150–400)
RBC: 1.9 MIL/uL — ABNORMAL LOW (ref 4.22–5.81)
RDW: 27.9 % — ABNORMAL HIGH (ref 11.5–15.5)
WBC: 12.7 10*3/uL — ABNORMAL HIGH (ref 4.0–10.5)
nRBC: 36 % — ABNORMAL HIGH (ref 0.0–0.2)

## 2022-09-22 LAB — COMPREHENSIVE METABOLIC PANEL
ALT: 26 U/L (ref 0–44)
AST: 22 U/L (ref 15–41)
Albumin: 3.7 g/dL (ref 3.5–5.0)
Alkaline Phosphatase: 69 U/L (ref 38–126)
Anion gap: 6 (ref 5–15)
BUN: 12 mg/dL (ref 6–20)
CO2: 21 mmol/L — ABNORMAL LOW (ref 22–32)
Calcium: 8.8 mg/dL — ABNORMAL LOW (ref 8.9–10.3)
Chloride: 108 mmol/L (ref 98–111)
Creatinine, Ser: 0.7 mg/dL (ref 0.61–1.24)
GFR, Estimated: >60 mL/min (ref >60)
Glucose, Bld: 81 mg/dL (ref 70–99)
Potassium: 4.2 mmol/L (ref 3.5–5.1)
Sodium: 135 mmol/L (ref 135–145)
Total Bilirubin: 7 mg/dL — ABNORMAL HIGH (ref 0.3–1.2)
Total Protein: 8.7 g/dL — ABNORMAL HIGH (ref 6.5–8.1)

## 2022-09-22 LAB — RETICULOCYTES
Immature Retic Fract: 28.2 % — ABNORMAL HIGH (ref 2.3–15.9)
RBC.: 1.75 MIL/uL — ABNORMAL LOW (ref 4.22–5.81)
Retic Count, Absolute: 434 10*3/uL — ABNORMAL HIGH (ref 19.0–186.0)
Retic Ct Pct: 24.8 % — ABNORMAL HIGH (ref 0.4–3.1)

## 2022-09-22 MED ORDER — SODIUM CHLORIDE 0.9% FLUSH
10.0000 mL | INTRAVENOUS | Status: AC | PRN
  Administered 2022-09-22: 10 mL

## 2022-09-22 MED ORDER — DIPHENHYDRAMINE HCL 25 MG PO CAPS: 25.0000 mg | ORAL_CAPSULE | ORAL | Status: DC | PRN

## 2022-09-22 MED ORDER — ONDANSETRON HCL 4 MG/2ML IJ SOLN: 4.0000 mg | Freq: Four times a day (QID) | INTRAMUSCULAR | Status: DC | PRN

## 2022-09-22 MED ORDER — HEPARIN SOD (PORK) LOCK FLUSH 100 UNIT/ML IV SOLN
500.0000 [IU] | INTRAVENOUS | Status: AC | PRN
  Administered 2022-09-22: 500 [IU]
  Filled 2022-09-22: qty 5

## 2022-09-22 MED ORDER — SODIUM CHLORIDE 0.45 % IV SOLN: INTRAVENOUS | Status: DC

## 2022-09-22 MED ORDER — ACETAMINOPHEN 500 MG PO TABS
1000.0000 mg | ORAL_TABLET | Freq: Once | ORAL | Status: AC
  Administered 2022-09-22: 1000 mg via ORAL
  Filled 2022-09-22: qty 2

## 2022-09-22 MED ORDER — NALOXONE HCL 0.4 MG/ML IJ SOLN: 0.4000 mg | INTRAMUSCULAR | Status: DC | PRN

## 2022-09-22 MED ORDER — HYDROMORPHONE 1 MG/ML IV SOLN
INTRAVENOUS | Status: DC
  Administered 2022-09-22: 11 mg via INTRAVENOUS
  Administered 2022-09-22: 30 mg via INTRAVENOUS
  Filled 2022-09-22: qty 30

## 2022-09-22 MED ORDER — SODIUM CHLORIDE 0.9% FLUSH: 9.0000 mL | INTRAVENOUS | Status: DC | PRN

## 2022-09-22 NOTE — Progress Notes (Signed)
Pt admitted to day hospital today for sickle cell pain treatment. On arrival, pt rates 9/10 pain to back. Pt received Dilaudid PCA, IV Toradol, and hydrated with IV fluids via PAC. Pt received PO Tylenol. At discharge, pt rates 4/10. PAC de-accessed and flushed with 0.9% Sodium Chloride and Heparin, site covered with band-aid. AVS offered, but patient declined. Pt is alert, oriented, and ambulatory at discharge.

## 2022-09-22 NOTE — Transitions of Care (Post Inpatient/ED Visit) (Signed)
09/22/2022  Name: Martin [name] MRN: [id number] DOB: Oct 31, 1990  Today's TOC FU Call Status: Today's TOC FU Call Status:: Successful TOC FU Call Competed TOC FU Call Complete Date: 09/22/22  Transition Care Management Follow-up Telephone Call Date of Discharge: 09/17/22 Discharge Facility: [facility]) Type of Discharge: Inpatient Admission Primary Inpatient Discharge Diagnosis:: sickle cell pain crisis How have you been since you were released from the hospital?: Better Any questions or concerns?: No  Items Reviewed: Did you receive and understand the discharge instructions provided?: Yes Medications obtained,verified, and reconciled?: Yes (Medications Reviewed) Any new allergies since your discharge?: No Dietary orders reviewed?: Yes Do you have support at home?: Yes  Medications Reviewed Today: Medications Reviewed Today     Reviewed by Merleen Nicely, LPN (Licensed Practical Nurse) on 09/22/22 at 1014  Med List Status: <None>   Medication Order Taking? Sig Documenting Provider Last Dose Status Informant  abacavir-dolutegravir-lamiVUDine (TRIUMEQ) 600-50-300 MG tablet [id number] Yes Take 1 tablet by mouth at bedtime. [provider] Taking Active Self           Med Note (CRUTHIS, CHLOE C   Mon Jul 13, 2022  1:47 PM) No fill hx found for this medication. Pt is adamant she is taking it daily.   apixaban (ELIQUIS) 5 MG TABS tablet [id number] Yes Take 1 tablet (5 mg total) by mouth 2 (two) times daily. Massie Maroon, [name] Taking Active Self           Med Note (CRUTHIS, Carlota Raspberry Jul 13, 2022  1:47 PM) LF 01/24 for 30 DS. Pt is adamant she is still taking this medication BID. Dispense report does not support this claim.   folic acid (FOLVITE) 1 MG tablet [id number] Yes Take 1 mg by mouth daily. [provider] Taking Active Self  gabapentin (NEURONTIN) 100 MG capsule [id number] Yes Take 200 mg by mouth as needed (pain). [provider]  Taking Active Self           Med Note Laney Potash Jun 22, 2022 11:34 PM)    hydroxyurea (HYDREA) 500 MG capsule [id number] Yes Take 4 capsules (2,000 mg total) by mouth at bedtime. Massie Maroon, [name] Taking Active Self           Med Note (CRUTHIS, CHLOE C   Mon Jul 13, 2022  1:48 PM) No fill hx found for this medication. Pt is adamant she is taking it.   naproxen (NAPROSYN) 500 MG tablet [id number] No Take 500 mg by mouth 2 (two) times daily with a meal.  Patient not taking: Reported on 09/22/2022   [provider] Not Taking Active Self  oxyCODONE-acetaminophen (PERCOCET) 10-325 MG tablet [id number] Yes Take 1 tablet by mouth every 4 hours as needed for pain. Quentin Angst, MD Taking Active Self            Home Care and Equipment/Supplies: Were Home Health Services Ordered?: No Any new equipment or medical supplies ordered?: No  Functional Questionnaire: Do you need assistance with bathing/showering or dressing?: No Do you need assistance with meal preparation?: No Do you need assistance with eating?: No Do you have difficulty maintaining continence: No Do you need assistance with getting out of bed/getting out of a chair/moving?: No Do you have difficulty managing or taking your medications?: No  Follow up appointments reviewed: PCP Follow-up appointment confirmed?: Yes Date of PCP follow-up appointment?: 09/22/22 Follow-up Provider: Julianne Handler [facility]  Follow-up appointment confirmed?: No Do you need transportation to your follow-up appointment?: No Do you understand care options if your condition(s) worsen?: Yes-patient verbalized understanding    SIGNATURE  Woodfin Ganja LPN [facility] Nurse Health Advisor Direct Dial [phone]

## 2022-09-22 NOTE — Telephone Encounter (Signed)
Patient called requesting to come to the day infusion hospital for sickle cell pain. Patient reports lower back pain rated 9/10. Reports taking Percocet at 7:00 am. COVID-19 screening done and patient denies all symptoms and exposures. Denies fever, chest pain, nausea, vomiting, diarrhea, abdominal pain and priapism. Admits to having transportation without driving self. He will call an [facility] or his sister will provide transportation.  [place], [name] notified. Patient can come to the day hospital for pain management. Patient advised and expresses an understanding.

## 2022-09-22 NOTE — H&P (Signed)
[facility] History and Physical   Date: 09/22/2022  Patient name: Martin [name] Medical record number: [id number] Date of birth: 1991/03/21 Age: 32 y.o. Gender: male PCP: Massie Maroon, [name]  Attending physician: Quentin Angst, MD  Chief Complaint: Sickle cell pain  History of present illness:  Martin [name] is a 32 year old male with a medical history significant for sickle cell disease, chronic pain syndrome, opiate dependence and tolerance, anemia of chronic disease, history of HIV, history of PE on Eliquis, and anemia of chronic disease presents with complaints of low back and lower extremity pain.  Pain is consistent with his previous sickle cell crisis.  Patient rates his pain as 9/10, constant, and throbbing.  He last had Percocet this a.m. without any sustained relief.  He has not identified any inciting factors concerning crisis.  He denies any fever, chills, chest pain, or shortness of breath.  No urinary symptoms, nausea, vomiting, or diarrhea.  No sick contacts or recent travel.  Meds: Medications Prior to Admission  Medication Sig Dispense Refill Last Dose   abacavir-dolutegravir-lamiVUDine (TRIUMEQ) 600-50-300 MG tablet Take 1 tablet by mouth at bedtime.      apixaban (ELIQUIS) 5 MG TABS tablet Take 1 tablet (5 mg total) by mouth 2 (two) times daily. 60 tablet 11    folic acid (FOLVITE) 1 MG tablet Take 1 mg by mouth daily.      gabapentin (NEURONTIN) 100 MG capsule Take 200 mg by mouth as needed (pain).      hydroxyurea (HYDREA) 500 MG capsule Take 4 capsules (2,000 mg total) by mouth at bedtime. 120 capsule 11    naproxen (NAPROSYN) 500 MG tablet Take 500 mg by mouth 2 (two) times daily with a meal. (Patient not taking: Reported on 09/22/2022)      oxyCODONE-acetaminophen (PERCOCET) 10-325 MG tablet Take 1 tablet by mouth every 4 hours as needed for pain. 90 tablet 0     Allergies: Ketorolac tromethamine, Tape, and Wound dressing  adhesive Past Medical History:  Diagnosis Date   Anxiety    HIV (human immunodeficiency virus infection) (HCC)    Proteinuria    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HCC)    Vitamin D deficiency 10/2018   Past Surgical History:  Procedure Laterality Date   IR IMAGING GUIDED PORT INSERTION  08/29/2019   IR REMOVAL TUN ACCESS W/ PORT W/O FL MOD SED  08/30/2020   TEE WITHOUT CARDIOVERSION N/A 08/30/2020   Procedure: TRANSESOPHAGEAL ECHOCARDIOGRAM (TEE);  Surgeon: [name], MD;  Location[facility];  Service: Cardiovascular;  Laterality: N/A;   Family History  Problem Relation Age of Onset   Sickle cell trait Mother    Sickle cell trait Father    Birth defects Maternal Grandmother    Birth defects Paternal Grandmother    Social History   Socioeconomic History   Marital status: Single    Spouse name: Not on file   Number of children: Not on file   Years of education: Not on file   Highest education level: Not on file  Occupational History   Occupation: disabled  Tobacco Use   Smoking status: Never   Smokeless tobacco: Never  Vaping Use   Vaping Use: Never used  Substance and Sexual Activity   Alcohol use: No   Drug use: Yes    Types: Marijuana    Comment: 2x week   Sexual activity: Yes    Birth control/protection: Condom  Other Topics Concern   Not on file  Social History Narrative   Not on file   Social Determinants of Health   Financial Resource Strain: Not on file  Food Insecurity: No Food Insecurity (09/15/2022)   Hunger Vital Sign    Worried About Running Out of Food in the Last Year: Never true    Ran Out of Food in the Last Year: Never true  Transportation Needs: No Transportation Needs (09/15/2022)   PRAPARE - Administrator, Civil Service (Medical): No    Lack of Transportation (Non-Medical): No  Physical Activity: Not on file  Stress: Not on file  Social Connections: Not on file  Intimate Partner Violence: Not At Risk (09/15/2022)   Humiliation,  Afraid, Rape, and Kick questionnaire    Fear of Current or Ex-Partner: No    Emotionally Abused: No    Physically Abused: No    Sexually Abused: No   Review of Systems  Constitutional: Negative.   HENT: Negative.    Eyes: Negative.   Respiratory: Negative.    Cardiovascular: Negative.   Gastrointestinal: Negative.   Genitourinary: Negative.   Musculoskeletal:  Positive for back pain and joint pain.  Skin: Negative.   Neurological: Negative.   Endo/Heme/Allergies: Negative.   Psychiatric/Behavioral: Negative.      Physical Exam: Blood pressure 115/75, pulse 87, temperature 98 F (36.7 C), temperature source Temporal, resp. rate 16, SpO2 95 %. Physical Exam Constitutional:      Appearance: Normal appearance.  Cardiovascular:     Rate and Rhythm: Normal rate and regular rhythm.  Pulmonary:     Effort: Pulmonary effort is normal.  Abdominal:     General: Bowel sounds are normal.  Skin:    General: Skin is warm.  Neurological:     General: No focal deficit present.     Mental Status: He is alert. Mental status is at baseline.  Psychiatric:        Mood and Affect: Mood normal.        Behavior: Behavior normal.        Thought Content: Thought content normal.        Judgment: Judgment normal.      Lab results: No results found for this or any previous visit (from the past 24 hour(s)).  Imaging results:  No results found.   Assessment & Plan: Patient admitted to sickle cell day infusion center for management of pain crisis.  Patient is opiate tolerant Initiate IV dilaudid PCA.  IV fluids, 0.45% saline at 100 ml/hr Toradol 15 mg IV times one dose Tylenol 1000 mg by mouth times one dose Review CBC with differential, complete metabolic panel, and reticulocytes as results become available. Baseline hemoglobin is Pain intensity will be reevaluated in context of functioning and relationship to baseline as care progresses If pain intensity remains elevated and/or sudden  change in hemodynamic stability transition to inpatient services for higher level of care.     [name]  APRN, [name], [name] Patient Care [facility] [address] [place], [place] [address] [phone]  09/22/2022, 12:56 PM

## 2022-09-22 NOTE — Discharge Summary (Signed)
[facility] Discharge Summary   Patient ID: Martin [name] MRN: [id number] DOB/AGE: [birth year] 33 y.o.  Admit date: 09/22/2022 Discharge date: 09/22/2022  Primary Care Physician:  Massie Maroon, [name]  Admission Diagnoses:  Principal Problem:   Sickle cell disease with crisis [facility])   Discharge Medications:  Allergies as of 09/22/2022       Reactions   Ketorolac Tromethamine Swelling, Other (See Comments)   Patient reports facial edema and left arm edema after administration.   Tape Rash, Other (See Comments)   PLEASE DO NOT USE THE CLEAR, THICK, "PLASTIC" TAPE- only paper tape is tolerated    Wound Dressing Adhesive Rash        Medication List     TAKE these medications    Eliquis 5 MG Tabs tablet Generic drug: apixaban Take 1 tablet (5 mg total) by mouth 2 (two) times daily.   folic acid 1 MG tablet Commonly known as: FOLVITE Take 1 mg by mouth daily.   gabapentin 100 MG capsule Commonly known as: NEURONTIN Take 200 mg by mouth as needed (pain).   hydroxyurea 500 MG capsule Commonly known as: HYDREA Take 4 capsules (2,000 mg total) by mouth at bedtime.   naproxen 500 MG tablet Commonly known as: NAPROSYN Take 500 mg by mouth 2 (two) times daily with a meal.   oxyCODONE-acetaminophen 10-325 MG tablet Commonly known as: Percocet Take 1 tablet by mouth every 4 hours as needed for pain.   Triumeq 600-50-300 MG tablet Generic drug: abacavir-dolutegravir-lamiVUDine Take 1 tablet by mouth at bedtime.         Consults:  None  Significant Diagnostic Studies:  ECHOCARDIOGRAM COMPLETE  Result Date: 09/15/2022    ECHOCARDIOGRAM REPORT   Patient Name:   Martin [name] Date of Exam: 09/15/2022 Medical Rec #:  [id number]           Height:       75.0 in Accession #:    [id number]          Weight:       148.6 lb Date of Birth:  1991-04-14           BSA:          1.934 m Patient Age:    32 years            BP:           116/76 mmHg Patient  Gender: M                   HR:           68 bpm. Exam Location:  Inpatient Procedure: 2D Echo, Cardiac Doppler and Color Doppler Indications:    CHF - Acute Systolic  History:        Patient has prior history of Echocardiogram examinations, most                 recent 07/14/2021. Sickle cell, Signs/Symptoms:respiratory                 failure; Risk Factors:HIV+, hx of PE.  Sonographer:    Wallie Char Referring Phys: [id number] RATHORE  Sonographer Comments: Global longitudinal strain was attempted. IMPRESSIONS  1. Left ventricular ejection fraction, by estimation, is 55 to 60%. The left ventricle has normal function. The left ventricle has no regional wall motion abnormalities. Left ventricular diastolic parameters were normal. The average left ventricular global longitudinal strain is -22.0 %. The global longitudinal strain is normal.  2. Right ventricular systolic function is normal. The right ventricular size is normal. There is mildly elevated pulmonary artery systolic pressure. The estimated right ventricular systolic pressure is 43.0 mmHg.  3. Left atrial size was mildly dilated.  4. The mitral valve is grossly normal. Mild mitral valve regurgitation. No evidence of mitral stenosis.  5. The aortic valve is tricuspid. Aortic valve regurgitation is not visualized. No aortic stenosis is present.  6. The inferior vena cava is dilated in size with >50% respiratory variability, suggesting right atrial pressure of 8 mmHg. FINDINGS  Left Ventricle: Left ventricular ejection fraction, by estimation, is 55 to 60%. The left ventricle has normal function. The left ventricle has no regional wall motion abnormalities. The average left ventricular global longitudinal strain is -22.0 %. The global longitudinal strain is normal. The left ventricular internal cavity size was normal in size. There is no left ventricular hypertrophy. Left ventricular diastolic parameters were normal. Right Ventricle: The right  ventricular size is normal. No increase in right ventricular wall thickness. Right ventricular systolic function is normal. There is mildly elevated pulmonary artery systolic pressure. The tricuspid regurgitant velocity is 2.96  m/s, and with an assumed right atrial pressure of 8 mmHg, the estimated right ventricular systolic pressure is 43.0 mmHg. Left Atrium: Left atrial size was mildly dilated. Right Atrium: Right atrial size was normal in size. Pericardium: Trivial pericardial effusion is present. Mitral Valve: The mitral valve is grossly normal. Mild mitral valve regurgitation. No evidence of mitral valve stenosis. MV peak gradient, 4.7 mmHg. The mean mitral valve gradient is 3.0 mmHg. Tricuspid Valve: The tricuspid valve is grossly normal. Tricuspid valve regurgitation is mild . No evidence of tricuspid stenosis. Aortic Valve: The aortic valve is tricuspid. Aortic valve regurgitation is not visualized. No aortic stenosis is present. Aortic valve mean gradient measures 4.0 mmHg. Aortic valve peak gradient measures 6.8 mmHg. Aortic valve area, by VTI measures 4.00 cm. Pulmonic Valve: The pulmonic valve was grossly normal. Pulmonic valve regurgitation is trivial. No evidence of pulmonic stenosis. Aorta: The aortic root and ascending aorta are structurally normal, with no evidence of dilitation. Venous: The inferior vena cava is dilated in size with greater than 50% respiratory variability, suggesting right atrial pressure of 8 mmHg. IAS/Shunts: The atrial septum is grossly normal.  LEFT VENTRICLE PLAX 2D LVIDd:         5.40 cm      Diastology LVIDs:         3.70 cm      LV e' medial:    10.60 cm/s LV PW:         1.00 cm      LV E/e' medial:  11.5 LV IVS:        0.90 cm      LV e' lateral:   12.80 cm/s LVOT diam:     2.30 cm      LV E/e' lateral: 9.5 LV SV:         120 LV SV Index:   62           2D Longitudinal Strain LVOT Area:     4.15 cm     2D Strain GLS Avg:     -22.0 %  LV Volumes (MOD) LV vol d, MOD A2C:  154.0 ml LV vol d, MOD A4C: 146.0 ml LV vol s, MOD A2C: 66.3 ml LV vol s, MOD A4C: 60.1 ml LV SV MOD A2C:     87.7 ml LV SV MOD A4C:  146.0 ml LV SV MOD BP:      88.7 ml RIGHT VENTRICLE RV Basal diam:  3.80 cm RV S prime:     15.00 cm/s TAPSE (M-mode): 2.4 cm LEFT ATRIUM             Index        RIGHT ATRIUM           Index LA diam:        4.60 cm 2.38 cm/m   RA Area:     19.80 cm LA Vol (A2C):   70.5 ml 36.45 ml/m  RA Volume:   57.40 ml  29.68 ml/m LA Vol (A4C):   78.3 ml 40.48 ml/m LA Biplane Vol: 75.9 ml 39.24 ml/m  AORTIC VALVE AV Area (Vmax):    4.03 cm AV Area (Vmean):   3.92 cm AV Area (VTI):     4.00 cm AV Vmax:           130.00 cm/s AV Vmean:          95.650 cm/s AV VTI:            0.300 m AV Peak Grad:      6.8 mmHg AV Mean Grad:      4.0 mmHg LVOT Vmax:         126.00 cm/s LVOT Vmean:        90.150 cm/s LVOT VTI:          0.289 m LVOT/AV VTI ratio: 0.96  AORTA Ao Root diam: 3.90 cm Ao Asc diam:  3.70 cm MITRAL VALVE                TRICUSPID VALVE MV Area (PHT): 3.24 cm     TR Peak grad:   35.0 mmHg MV Area VTI:   3.81 cm     TR Vmax:        296.00 cm/s MV Peak grad:  4.7 mmHg MV Mean grad:  3.0 mmHg     SHUNTS MV Vmax:       1.08 m/s     Systemic VTI:  0.29 m MV Vmean:      77.0 cm/s    Systemic Diam: 2.30 cm MV Decel Time: 234 msec MV E velocity: 122.00 cm/s MV A velocity: 105.00 cm/s MV E/A ratio:  1.16 Lennie Odor MD Electronically signed by Lennie Odor MD Signature Date/Time: 09/15/2022/11:36:52 AM    Final    CT Angio Chest PE W and/or Wo Contrast  Result Date: 09/15/2022 CLINICAL DATA:  History of sickle cell anemia and HIV presenting with back pain x1 day. EXAM: CT ANGIOGRAPHY CHEST WITH CONTRAST TECHNIQUE: Multidetector CT imaging of the chest was performed using the standard protocol during bolus administration of intravenous contrast. Multiplanar CT image reconstructions and MIPs were obtained to evaluate the vascular anatomy. RADIATION DOSE REDUCTION: This exam was  performed according to the departmental dose-optimization program which includes automated exposure control, adjustment of the mA and/or kV according to patient size and/or use of iterative reconstruction technique. CONTRAST:  75mL OMNIPAQUE IOHEXOL 350 MG/ML SOLN COMPARISON:  August 03, 2021 FINDINGS: Cardiovascular: There is stable right-sided venous Port-A-Cath positioning. The thoracic aorta is normal in appearance. Satisfactory opacification of the pulmonary arteries to the segmental level. No evidence of pulmonary embolism. Normal heart size. No pericardial effusion. Mediastinum/Nodes: Stable right hilar lymphadenopathy is seen. Thyroid gland, trachea, and esophagus demonstrate no significant findings. Lungs/Pleura: Stable, mild to moderate severity patchy areas of ground-glass appearing parenchyma are seen scattered  throughout both lungs. Stable areas of focal scarring and/or atelectasis are seen within the bilateral upper lobes. Moderate severity the lingular and bilateral lower lobe scarring and/or atelectasis is also seen. No pleural effusion or pneumothorax is identified. Upper Abdomen: Surgical clips are seen within the gallbladder fossa. A small, densely calcified spleen is noted. Musculoskeletal: No chest wall abnormality. No acute or significant osseous findings. Review of the MIP images confirms the above findings. IMPRESSION: 1. No evidence of pulmonary embolism. 2. Stable, mild to moderate severity patchy areas of ground-glass appearing parenchyma scattered throughout both lungs which may represent sequelae associated with an atypical infection. 3. Stable areas of focal scarring and/or atelectasis within the bilateral upper lobes. 4. Moderate severity lingular and bilateral lower lobe scarring and/or atelectasis. 5. Stable right hilar lymphadenopathy. 6. Evidence of prior cholecystectomy. Electronically Signed   By: Aram Candela M.D.   On: 09/15/2022 01:11   DG Chest 2 View  Result Date:  09/14/2022 CLINICAL DATA:  Sickle cell crisis. EXAM: CHEST - 2 VIEW COMPARISON:  June 28, 2022 FINDINGS: There is stable right-sided venous Port-A-Cath positioning. The heart size and mediastinal contours are within normal limits. Mild to moderate severity increased interstitial lung markings are seen with mild prominence of the bilateral perihilar pulmonary vasculature. There is no evidence of a pleural effusion or pneumothorax. No acute osseous abnormalities are identified. IMPRESSION: Findings consistent with mild to moderate severity pulmonary vascular congestion. Electronically Signed   By: Aram Candela M.D.   On: 09/14/2022 21:44    History of present illness:  Martin [name] is a 32 year old male with a medical history significant for sickle cell disease, chronic pain syndrome, opiate dependence and tolerance, anemia of chronic disease, history of HIV, history of PE on Eliquis, and anemia of chronic disease presents with complaints of low back and lower extremity pain.  Pain is consistent with his previous sickle cell crisis.  Patient rates his pain as 9/10, constant, and throbbing.  He last had Percocet this a.m. without any sustained relief.  He has not identified any inciting factors concerning crisis.  He denies any fever, chills, chest pain, or shortness of breath.  No urinary symptoms, nausea, vomiting, or diarrhea.  No sick contacts or recent travel. [facility] Course: Patient admitted to sickle cell day infusion clinic for management of pain crisis. Review laboratory values, consistent with baseline. Pain managed with IV Dilaudid PCA, IV fluids, and Tylenol. Pain intensity decreased throughout admission and patient is requesting discharge home. He is alert, oriented, and ambulating without assistance.  Patient will discharge home in a hemodynamically stable condition.  Discharge instructions: Resume all home medications.   Follow up with PCP as previously   scheduled.   Discussed the importance of drinking 64 ounces of water daily, dehydration of red blood cells may lead further sickling.   Avoid all stressors that precipitate sickle cell pain crisis.     The patient was given clear instructions to go to ER or return to medical center if symptoms do not improve, worsen or new problems develop.   Physical Exam at Discharge:  BP 107/64 (BP Location: Right Arm)   Pulse 85   Temp 98 F (36.7 C) (Temporal)   Resp 15   SpO2 90%    Physical Exam Constitutional:      Appearance: Normal appearance.  Eyes:     Pupils: Pupils are equal, round, and reactive to light.  Cardiovascular:     Rate and Rhythm: Normal rate  and regular rhythm.  Pulmonary:     Effort: Pulmonary effort is normal.  Abdominal:     General: Bowel sounds are normal.  Musculoskeletal:        General: Normal range of motion.  Skin:    General: Skin is warm.  Neurological:     General: No focal deficit present.     Mental Status: He is alert. Mental status is at baseline.  Psychiatric:        Mood and Affect: Mood normal.        Behavior: Behavior normal.        Thought Content: Thought content normal.        Judgment: Judgment normal.    Disposition at Discharge: Discharge disposition: 01-Home or Self Care       Discharge Orders: Discharge Instructions     Discharge patient   Complete by: As directed    Discharge disposition: 01-Home or Self Care   Discharge patient date: 09/22/2022       Condition at Discharge:   Stable  Time spent on Discharge:  Greater than 30 minutes.  Signed: [name]  APRN, [name], [name] Patient Care [facility] [address] [place], [place] [address] [phone]  09/22/2022, 3:57 PM

## 2022-09-24 ENCOUNTER — Telehealth: Payer: Self-pay | Admitting: Family Medicine

## 2022-09-24 ENCOUNTER — Other Ambulatory Visit: Payer: Self-pay | Admitting: Family Medicine

## 2022-09-24 DIAGNOSIS — D571 Sickle-cell disease without crisis: Secondary | ICD-10-CM

## 2022-09-24 DIAGNOSIS — F1190 Opioid use, unspecified, uncomplicated: Secondary | ICD-10-CM

## 2022-09-24 MED ORDER — OXYCODONE-ACETAMINOPHEN 10-325 MG PO TABS
1.0000 | ORAL_TABLET | ORAL | 0 refills | Status: DC | PRN
Start: 2022-09-26 — End: 2022-10-12

## 2022-09-24 NOTE — Telephone Encounter (Signed)
Caller & Relationship to patient:  MRN #  [id number]   Call Back Number:   Date of Last Office Visit: 09/21/2022     Date of Next Office Visit: 10/01/2022    Medication(s) to be Refilled: Percocet  Preferred Pharmacy: [facility]  ** Please notify patient to allow 48-72 hours to process** **Let patient know to contact pharmacy at the end of the day to make sure medication is ready. ** **If patient has not been seen in a year or longer, book an appointment **Advise to use MyChart for refill requests OR to contact their pharmacy

## 2022-09-24 NOTE — Progress Notes (Signed)
Reviewed PDMP substance reporting system prior to prescribing opiate medications. No inconsistencies noted.  Meds ordered this encounter  Medications   oxyCODONE-acetaminophen (PERCOCET) 10-325 MG tablet    Sig: Take 1 tablet by mouth every 4 hours as needed for pain.    Dispense:  90 tablet    Refill:  0    Order Specific Question:   Supervising Provider    Answer:   JEGEDE, OLUGBEMIGA E [[id number]]   Martin Mulvaney [name]  APRN, [name], [name] [facility] [address]  [place],  [address] [phone]  

## 2022-09-25 ENCOUNTER — Emergency Department (HOSPITAL_COMMUNITY): Payer: Medicaid Other

## 2022-09-25 ENCOUNTER — Encounter (HOSPITAL_COMMUNITY): Payer: Self-pay

## 2022-09-25 ENCOUNTER — Inpatient Hospital Stay (HOSPITAL_COMMUNITY): Payer: Medicaid Other

## 2022-09-25 ENCOUNTER — Inpatient Hospital Stay (HOSPITAL_COMMUNITY)
Admission: EM | Admit: 2022-09-25 | Discharge: 2022-10-06 | DRG: 811 | Disposition: A | Discharge status: Home / Self Care | Payer: Medicaid Other | Attending: Internal Medicine | Admitting: Internal Medicine

## 2022-09-25 ENCOUNTER — Other Ambulatory Visit: Payer: Self-pay

## 2022-09-25 DIAGNOSIS — Z7901 Long term (current) use of anticoagulants: Secondary | ICD-10-CM | POA: Diagnosis not present

## 2022-09-25 DIAGNOSIS — Z832 Family history of diseases of the blood and blood-forming organs and certain disorders involving the immune mechanism: Secondary | ICD-10-CM

## 2022-09-25 DIAGNOSIS — Z91048 Other nonmedicinal substance allergy status: Secondary | ICD-10-CM | POA: Diagnosis not present

## 2022-09-25 DIAGNOSIS — G894 Chronic pain syndrome: Secondary | ICD-10-CM | POA: Diagnosis present

## 2022-09-25 DIAGNOSIS — Z21 Asymptomatic human immunodeficiency virus [HIV] infection status: Secondary | ICD-10-CM | POA: Diagnosis present

## 2022-09-25 DIAGNOSIS — D5701 Hb-SS disease with acute chest syndrome: Secondary | ICD-10-CM | POA: Diagnosis present

## 2022-09-25 DIAGNOSIS — D5700 Hb-SS disease with crisis, unspecified: Secondary | ICD-10-CM | POA: Diagnosis present

## 2022-09-25 DIAGNOSIS — Z9981 Dependence on supplemental oxygen: Secondary | ICD-10-CM | POA: Diagnosis not present

## 2022-09-25 DIAGNOSIS — J9621 Acute and chronic respiratory failure with hypoxia: Secondary | ICD-10-CM | POA: Diagnosis present

## 2022-09-25 DIAGNOSIS — Z79899 Other long term (current) drug therapy: Secondary | ICD-10-CM | POA: Diagnosis not present

## 2022-09-25 DIAGNOSIS — Z888 Allergy status to other drugs, medicaments and biological substances status: Secondary | ICD-10-CM | POA: Diagnosis not present

## 2022-09-25 DIAGNOSIS — B20 Human immunodeficiency virus [HIV] disease: Secondary | ICD-10-CM | POA: Diagnosis present

## 2022-09-25 DIAGNOSIS — Z86711 Personal history of pulmonary embolism: Secondary | ICD-10-CM

## 2022-09-25 DIAGNOSIS — D75838 Other thrombocytosis: Secondary | ICD-10-CM | POA: Diagnosis present

## 2022-09-25 DIAGNOSIS — D638 Anemia in other chronic diseases classified elsewhere: Secondary | ICD-10-CM | POA: Diagnosis present

## 2022-09-25 DIAGNOSIS — Z91148 Patient's other noncompliance with medication regimen for other reason: Secondary | ICD-10-CM

## 2022-09-25 LAB — PREPARE RBC (CROSSMATCH)

## 2022-09-25 LAB — CBC WITH DIFFERENTIAL/PLATELET
Abs Immature Granulocytes: 0.06 10*3/uL (ref 0.00–0.07)
Basophils Absolute: 0.1 10*3/uL (ref 0.0–0.1)
Basophils Relative: 1 %
Eosinophils Absolute: 0.4 10*3/uL (ref 0.0–0.5)
Eosinophils Relative: 3 %
HCT: 21.8 % — ABNORMAL LOW (ref 39.0–52.0)
Hemoglobin: 7.8 g/dL — ABNORMAL LOW (ref 13.0–17.0)
Immature Granulocytes: 1 %
Lymphocytes Relative: 48 %
Lymphs Abs: 6 10*3/uL — ABNORMAL HIGH (ref 0.7–4.0)
MCH: 38.2 pg — ABNORMAL HIGH (ref 26.0–34.0)
MCHC: 35.8 g/dL (ref 30.0–36.0)
MCV: 106.9 fL — ABNORMAL HIGH (ref 80.0–100.0)
Monocytes Absolute: 1.6 10*3/uL — ABNORMAL HIGH (ref 0.1–1.0)
Monocytes Relative: 12 %
Neutro Abs: 4.4 10*3/uL (ref 1.7–7.7)
Neutrophils Relative %: 35 %
Platelets: 387 10*3/uL (ref 150–400)
RBC: 2.04 MIL/uL — ABNORMAL LOW (ref 4.22–5.81)
RDW: 26.2 % — ABNORMAL HIGH (ref 11.5–15.5)
WBC: 12.5 10*3/uL — ABNORMAL HIGH (ref 4.0–10.5)
nRBC: 103.3 % — ABNORMAL HIGH (ref 0.0–0.2)

## 2022-09-25 LAB — COMPREHENSIVE METABOLIC PANEL
ALT: 16 U/L (ref 0–44)
AST: 22 U/L (ref 15–41)
Albumin: 3.5 g/dL (ref 3.5–5.0)
Alkaline Phosphatase: 61 U/L (ref 38–126)
Anion gap: 10 (ref 5–15)
BUN: 11 mg/dL (ref 6–20)
CO2: 23 mmol/L (ref 22–32)
Calcium: 8.2 mg/dL — ABNORMAL LOW (ref 8.9–10.3)
Chloride: 106 mmol/L (ref 98–111)
Creatinine, Ser: 1.06 mg/dL (ref 0.61–1.24)
GFR, Estimated: >60 mL/min (ref >60)
Glucose, Bld: 92 mg/dL (ref 70–99)
Potassium: 3.8 mmol/L (ref 3.5–5.1)
Sodium: 139 mmol/L (ref 135–145)
Total Bilirubin: 6.3 mg/dL — ABNORMAL HIGH (ref 0.3–1.2)
Total Protein: 8.1 g/dL (ref 6.5–8.1)

## 2022-09-25 LAB — PROTIME-INR
INR: 1.3 — ABNORMAL HIGH (ref 0.8–1.2)
Prothrombin Time: 16.7 seconds — ABNORMAL HIGH (ref 11.4–15.2)

## 2022-09-25 LAB — LACTATE DEHYDROGENASE: LDH: 492 U/L — ABNORMAL HIGH (ref 98–192)

## 2022-09-25 LAB — CBC
HCT: 21.2 % — ABNORMAL LOW (ref 39.0–52.0)
Hemoglobin: 7.6 g/dL — ABNORMAL LOW (ref 13.0–17.0)
MCH: 38.6 pg — ABNORMAL HIGH (ref 26.0–34.0)
MCHC: 35.8 g/dL (ref 30.0–36.0)
MCV: 107.6 fL — ABNORMAL HIGH (ref 80.0–100.0)
Platelets: 398 10*3/uL (ref 150–400)
RBC: 1.97 MIL/uL — ABNORMAL LOW (ref 4.22–5.81)
RDW: 25.8 % — ABNORMAL HIGH (ref 11.5–15.5)
WBC: 14.5 10*3/uL — ABNORMAL HIGH (ref 4.0–10.5)
nRBC: 79.9 % — ABNORMAL HIGH (ref 0.0–0.2)

## 2022-09-25 LAB — RETICULOCYTES
Immature Retic Fract: 30.8 % — ABNORMAL HIGH (ref 2.3–15.9)
RBC.: 2 MIL/uL — ABNORMAL LOW (ref 4.22–5.81)
Retic Count, Absolute: 702 10*3/uL — ABNORMAL HIGH (ref 19.0–186.0)
Retic Ct Pct: >30 % — ABNORMAL HIGH (ref 0.4–3.1)

## 2022-09-25 LAB — TYPE AND SCREEN

## 2022-09-25 LAB — BPAM RBC: Unit Type and Rh: 9500

## 2022-09-25 MED ORDER — HYDROXYUREA 500 MG PO CAPS
2000.0000 mg | ORAL_CAPSULE | Freq: Every day | ORAL | Status: DC
  Administered 2022-09-25 – 2022-10-05 (×11): 2000 mg via ORAL
  Filled 2022-09-25 (×11): qty 4

## 2022-09-25 MED ORDER — HYDROMORPHONE HCL 2 MG/ML IJ SOLN
2.0000 mg | INTRAMUSCULAR | Status: AC
  Administered 2022-09-25: 2 mg via INTRAVENOUS
  Filled 2022-09-25: qty 1

## 2022-09-25 MED ORDER — SODIUM CHLORIDE 0.9% IV SOLUTION: Freq: Once | INTRAVENOUS | Status: AC

## 2022-09-25 MED ORDER — DEXTROSE-SODIUM CHLORIDE 5-0.45 % IV SOLN: INTRAVENOUS | Status: DC

## 2022-09-25 MED ORDER — FOLIC ACID 1 MG PO TABS
1.0000 mg | ORAL_TABLET | Freq: Every day | ORAL | Status: DC
  Administered 2022-09-25 – 2022-10-06 (×12): 1 mg via ORAL
  Filled 2022-09-25 (×12): qty 1

## 2022-09-25 MED ORDER — ACETAMINOPHEN 325 MG PO TABS
650.0000 mg | ORAL_TABLET | Freq: Once | ORAL | Status: AC
  Administered 2022-09-26: 650 mg via ORAL
  Filled 2022-09-25: qty 2

## 2022-09-25 MED ORDER — HYDROMORPHONE 1 MG/ML IV SOLN
INTRAVENOUS | Status: DC
  Administered 2022-09-25: 14.5 mg via INTRAVENOUS
  Administered 2022-09-25: 30 mg via INTRAVENOUS
  Administered 2022-09-25: 7 mg via INTRAVENOUS
  Administered 2022-09-26: 4.5 mg via INTRAVENOUS
  Administered 2022-09-26: 30 mg via INTRAVENOUS
  Administered 2022-09-26: 3 mg via INTRAVENOUS
  Administered 2022-09-26: 6 mg via INTRAVENOUS
  Administered 2022-09-26: 30 mg via INTRAVENOUS
  Administered 2022-09-26: 3.5 mg via INTRAVENOUS
  Administered 2022-09-26: 7 mg via INTRAVENOUS
  Administered 2022-09-27: 7.5 mg via INTRAVENOUS
  Administered 2022-09-27 (×2): 8 mg via INTRAVENOUS
  Administered 2022-09-27: 7.5 mg via INTRAVENOUS
  Administered 2022-09-27: 30 mg via INTRAVENOUS
  Administered 2022-09-27: 7 mg via INTRAVENOUS
  Administered 2022-09-27: 7.5 mg via INTRAVENOUS
  Administered 2022-09-28: 3 mg via INTRAVENOUS
  Administered 2022-09-28: 7 mg via INTRAVENOUS
  Administered 2022-09-28: 30 mg via INTRAVENOUS
  Administered 2022-09-28: 4.5 mg via INTRAVENOUS
  Administered 2022-09-28: 10 mg via INTRAVENOUS
  Administered 2022-09-28: 30 mg via INTRAVENOUS
  Administered 2022-09-29: 7 mg via INTRAVENOUS
  Administered 2022-09-29: 1 mg via INTRAVENOUS
  Administered 2022-09-29: 5.5 mg via INTRAVENOUS
  Administered 2022-09-29: 30 mg via INTRAVENOUS
  Administered 2022-09-30: 2 mg via INTRAVENOUS
  Administered 2022-09-30: 4 mg via INTRAVENOUS
  Administered 2022-09-30: 30 mg via INTRAVENOUS
  Administered 2022-09-30: 3 mg via INTRAVENOUS
  Administered 2022-09-30 (×2): 6 mg via INTRAVENOUS
  Administered 2022-09-30: 14.5 mg via INTRAVENOUS
  Administered 2022-10-01: 2 mg via INTRAVENOUS
  Administered 2022-10-01: 8.5 mg via INTRAVENOUS
  Administered 2022-10-01: 30 mg via INTRAVENOUS
  Administered 2022-10-01: 5.5 mg via INTRAVENOUS
  Administered 2022-10-02: 6 mg via INTRAVENOUS
  Administered 2022-10-02: 7.5 mg via INTRAVENOUS
  Administered 2022-10-02: 30 mg via INTRAVENOUS
  Administered 2022-10-02: 8 mg via INTRAVENOUS
  Administered 2022-10-02: 5.5 mg via INTRAVENOUS
  Administered 2022-10-03: 1.5 mg via INTRAVENOUS
  Administered 2022-10-03: 3 mg via INTRAVENOUS
  Administered 2022-10-03: 30 mg via INTRAVENOUS
  Administered 2022-10-03: 0.5 mg via INTRAVENOUS
  Administered 2022-10-03: 9.5 mg via INTRAVENOUS
  Administered 2022-10-03: 4.5 mg via INTRAVENOUS
  Administered 2022-10-03: 3.5 mg via INTRAVENOUS
  Administered 2022-10-04: 12 mg via INTRAVENOUS
  Administered 2022-10-04: 2.5 mg via INTRAVENOUS
  Administered 2022-10-04: 2 mg via INTRAVENOUS
  Administered 2022-10-04: 5.5 mg via INTRAVENOUS
  Administered 2022-10-04: 8 mg via INTRAVENOUS
  Administered 2022-10-04: 6.5 mg via INTRAVENOUS
  Administered 2022-10-04 – 2022-10-05 (×3): 30 mg via INTRAVENOUS
  Administered 2022-10-05: 3.5 mg via INTRAVENOUS
  Administered 2022-10-05: 11 mg via INTRAVENOUS
  Administered 2022-10-05: 3 mg via INTRAVENOUS
  Administered 2022-10-05: 5.5 mg via INTRAVENOUS
  Administered 2022-10-05: 7.5 mg via INTRAVENOUS
  Administered 2022-10-06: 3 mg via INTRAVENOUS
  Administered 2022-10-06: 6 mg via INTRAVENOUS
  Administered 2022-10-06: 7.5 mg via INTRAVENOUS
  Filled 2022-09-25 (×14): qty 30

## 2022-09-25 MED ORDER — OXYCODONE-ACETAMINOPHEN 5-325 MG PO TABS
2.0000 | ORAL_TABLET | Freq: Once | ORAL | Status: AC
  Administered 2022-09-25: 2 via ORAL
  Filled 2022-09-25: qty 2

## 2022-09-25 MED ORDER — APIXABAN 5 MG PO TABS
5.0000 mg | ORAL_TABLET | Freq: Two times a day (BID) | ORAL | Status: DC
  Administered 2022-09-25 – 2022-10-06 (×23): 5 mg via ORAL
  Filled 2022-09-25 (×23): qty 1

## 2022-09-25 MED ORDER — SENNOSIDES-DOCUSATE SODIUM 8.6-50 MG PO TABS
1.0000 | ORAL_TABLET | Freq: Two times a day (BID) | ORAL | Status: DC
  Administered 2022-09-25 – 2022-09-30 (×5): 1 via ORAL
  Filled 2022-09-25 (×9): qty 1

## 2022-09-25 MED ORDER — HYDROMORPHONE HCL 2 MG/ML IJ SOLN
2.0000 mg | INTRAMUSCULAR | Status: AC | PRN
  Administered 2022-09-26 (×3): 2 mg via INTRAVENOUS
  Filled 2022-09-25 (×3): qty 1

## 2022-09-25 MED ORDER — DIPHENHYDRAMINE HCL 50 MG/ML IJ SOLN
25.0000 mg | Freq: Once | INTRAMUSCULAR | Status: AC
  Administered 2022-09-25: 25 mg via INTRAVENOUS
  Filled 2022-09-25: qty 1

## 2022-09-25 MED ORDER — SODIUM CHLORIDE 0.9% FLUSH: 9.0000 mL | INTRAVENOUS | Status: DC | PRN

## 2022-09-25 MED ORDER — CHLORHEXIDINE GLUCONATE CLOTH 2 % EX PADS
6.0000 | MEDICATED_PAD | Freq: Every day | CUTANEOUS | Status: DC
  Administered 2022-09-25 – 2022-10-06 (×6): 6 via TOPICAL

## 2022-09-25 MED ORDER — DIPHENHYDRAMINE HCL 25 MG PO CAPS
25.0000 mg | ORAL_CAPSULE | ORAL | Status: DC | PRN
  Administered 2022-09-25 – 2022-09-30 (×5): 25 mg via ORAL
  Filled 2022-09-25 (×5): qty 1

## 2022-09-25 MED ORDER — NALOXONE HCL 0.4 MG/ML IJ SOLN: 0.4000 mg | INTRAMUSCULAR | Status: DC | PRN

## 2022-09-25 MED ORDER — POLYETHYLENE GLYCOL 3350 17 G PO PACK: 17.0000 g | PACK | Freq: Every day | ORAL | Status: DC | PRN

## 2022-09-25 MED ORDER — ABACAVIR-DOLUTEGRAVIR-LAMIVUD 600-50-300 MG PO TABS
1.0000 | ORAL_TABLET | Freq: Every day | ORAL | Status: DC
  Administered 2022-09-25 – 2022-10-05 (×11): 1 via ORAL
  Filled 2022-09-25 (×11): qty 1

## 2022-09-25 MED ORDER — SODIUM CHLORIDE 0.9 % IV SOLN
1.0000 g | INTRAVENOUS | Status: DC
  Administered 2022-09-25 – 2022-09-29 (×5): 1 g via INTRAVENOUS
  Filled 2022-09-25 (×5): qty 10

## 2022-09-25 MED ORDER — ONDANSETRON HCL 4 MG/2ML IJ SOLN
4.0000 mg | INTRAMUSCULAR | Status: DC | PRN
  Administered 2022-09-25 – 2022-10-06 (×14): 4 mg via INTRAVENOUS
  Filled 2022-09-25 (×14): qty 2

## 2022-09-25 MED ORDER — GABAPENTIN 100 MG PO CAPS
200.0000 mg | ORAL_CAPSULE | ORAL | Status: DC | PRN
  Administered 2022-09-25: 200 mg via ORAL
  Filled 2022-09-25: qty 2

## 2022-09-25 MED ORDER — DIPHENHYDRAMINE HCL 50 MG/ML IJ SOLN
25.0000 mg | Freq: Once | INTRAMUSCULAR | Status: AC
  Administered 2022-09-26: 25 mg via INTRAVENOUS
  Filled 2022-09-25: qty 1

## 2022-09-25 MED ORDER — HYDROMORPHONE HCL 2 MG/ML IJ SOLN
2.0000 mg | Freq: Once | INTRAMUSCULAR | Status: AC
  Administered 2022-09-25: 2 mg via INTRAVENOUS
  Filled 2022-09-25: qty 1

## 2022-09-25 NOTE — TOC Initial Note (Signed)
Transition of Care [facility]) - Initial/Assessment Note    Patient Details  Name: Martin [name] MRN: [id number] Date of Birth: 1991/01/06  Transition of Care [facility]) CM/SW Contact:    Beckie Busing, RN Phone Number:[phone]  09/25/2022, 3:41 PM  Clinical Narrative:                 TOC acknowledges patient with high risk for readmission. Patient is from home where he is independent. Patient [facility]. Patient follows up on a regular basis. Patient does have access to medications and they are affordable.  Patient has no home services. Has home O2. There are currently no TOC needs. TOC will continue to follow.   Expected Discharge Plan: Home/Self Care Barriers to Discharge: Continued Medical Work up (new admit)   Patient Goals and CMS Choice Patient states their goals for this hospitalization and ongoing recovery are:: Wants to get better to go home CMS Medicare.gov Compare Post Acute Care list provided to::  (n/a) Choice offered to / list presented to : NA [place] ownership interest in [facility]provided to::  (n/a)    Expected Discharge Plan and Services In-house Referral: NA Discharge Planning Services: CM Consult Post Acute Care Choice: NA Living arrangements for the past 2 months: Single Family Home                 DME Arranged: N/A DME Agency: NA       HH Arranged: NA HH Agency: NA        Prior Living Arrangements/Services Living arrangements for the past 2 months: Single Family Home Lives with:: Self Patient language and need for interpreter reviewed:: Yes Do you feel safe going back to the place where you live?: Yes      Need for Family Participation in Patient Care: Yes (Comment) Care giver support system in place?: Yes (comment) Current home services: DME (Home O2) Criminal Activity/Legal Involvement Pertinent to Current Situation/Hospitalization: No - Comment as needed  Activities of Daily Living      Permission  Sought/Granted Permission sought to share information with : Family Supports Permission granted to share information with : No              Emotional Assessment Appearance:: Appears stated age Attitude/Demeanor/Rapport: Gracious Affect (typically observed): Quiet, Pleasant Orientation: : Oriented to Self, Oriented to Place, Oriented to  Time, Oriented to Situation Alcohol / Substance Use: Not Applicable Psych Involvement: No (comment)  Admission diagnosis:  Sickle cell pain crisis (HCC) [D57.00] Acute on chronic respiratory failure with hypoxia (HCC) [J96.21] Patient Active Problem List   Diagnosis Date Noted   Anemia of chronic disease 07/13/2022   Hyperbilirubinemia 07/13/2022   Tinea capitis 07/29/2021   Sickle cell crisis (HCC) 06/24/2021   Bacteremia due to Enterococcus 08/29/2020   Acute chest syndrome (HCC) 08/26/2020   Hypoxia 07/09/2020   COVID-19 05/13/2020   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/18/2020   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   Abnormal penile discharge, without blood 02/22/2020   Leukocytosis 01/02/2020   Anxiety 11/27/2019   Proteinuria 11/27/2019   Acute on chronic respiratory failure with hypoxia (HCC) 11/16/2019   Chronic, continuous use of opioids 08/15/2019   Seasonal allergies 08/15/2019   Chest congestion    Sickle cell anemia with crisis (HCC) 08/04/2019   Chronic pain syndrome 08/04/2019   History of pulmonary embolus (PE) 08/04/2019   Single subsegmental pulmonary embolism without acute cor pulmonale (HCC) 02/10/2019   Acute chest  syndrome due to sickle cell crisis (HCC) 02/10/2019   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) 03/09/2018   Bone pain 03/07/2018   Hip pain 03/07/2018   Acute bronchitis due to Streptococcus 02/21/2018   Heart murmur 02/03/2017   Sickle cell disease with crisis (HCC) 02/02/2017   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 [place] [address] Phone: [phone] Fax: [phone]  [facility][id number] [place], [place] - [address] AT [facility] OF [address] [place] [address] Phone: [phone] Fax: [phone]  [facility] #[id number] - [place], [place] - [address] AT [facility] OF [address] [place] [address] [place] [address] Phone: [phone] Fax: [phone]  [facility] #[id number] - [place], [place] - [address] AT [facility] OF [address] [place] [address] Phone: [phone] Fax: [phone]  [facility] [address] [facility] - [place], [place] - [address] AT [address] [place] [address] Phone: [phone] Fax: [phone]  [id number] [place], [place] - [address] AT [address] [place] [address] Phone: [phone] Fax: [phone]     Social Determinants of Health (SDOH) Social History: SDOH Screenings   Food Insecurity: No Food Insecurity (09/15/2022)  Housing: Patient Unable To Answer (09/15/2022)  Transportation Needs: No Transportation Needs (09/15/2022)  Utilities: Not At Risk (09/15/2022)  Depression (PHQ2-9): Low Risk  (03/31/2022)  Tobacco Use: Low Risk  (09/25/2022)   SDOH Interventions:     Readmission Risk Interventions    09/25/2022    3:29 PM 09/17/2022    8:35 AM 07/15/2022    1:02 PM  Readmission Risk Prevention Plan  Transportation Screening Complete Complete Complete  PCP or  Specialist Appt within 5-7 Days  Complete   PCP or Specialist Appt within 3-5 Days   Complete  Home Care Screening  Complete   Medication Review (RN CM)  Referral to Pharmacy   HRI or Home Care Consult   Complete  Social Work Consult for Recovery Care Planning/Counseling   Complete  Palliative Care Screening   Not Applicable  Medication Review [employer]) Complete  Complete  PCP or Specialist appointment within 3-5 days of discharge Complete    HRI or Home Care Consult Complete    SW Recovery Care/Counseling Consult Complete    Palliative Care Screening Not Applicable    Skilled Nursing Facility Not Applicable

## 2022-09-25 NOTE — ED Notes (Signed)
ED TO INPATIENT HANDOFF REPORT  ED Nurse Name and Phone #: Mellody Dance  -  [phone]    S Name/Age/Gender Martin [name] 32 y.o. male Room/Bed: WA11/WA11  Code Status   Code Status: Full Code  Home/SNF/Other Home Patient oriented to: self, place, time, and situation Is this baseline? Yes   Triage Complete: Triage complete  Chief Complaint Sickle cell pain crisis [facility]) [D57.00]  Triage Note Patient reports sickle cell pain all over, states it has been going on all day. Reports pain 10/10 currently.   Allergies Allergies  Allergen Reactions   Ketorolac Tromethamine Swelling and Other (See Comments)    Patient reports facial edema and left arm edema after administration.   Tape Rash and Other (See Comments)    PLEASE DO NOT USE THE CLEAR, THICK, "PLASTIC" TAPE- only paper tape is tolerated    Wound Dressing Adhesive Rash    Level of Care/Admitting Diagnosis ED Disposition     ED Disposition  Admit   Condition  --   Comment  Hospital Area: [facility] [[id number]]  Level of Care: Med-Surg [16]  May admit patient to [facility] or [facility] if equivalent level of care is available:: No  Covid Evaluation: Asymptomatic - no recent exposure (last 10 days) testing not required  Diagnosis: Sickle cell pain crisis [facility]) [[id number]]  Admitting Physician: Massie Maroon [[id number]]  Attending Physician: Quentin Angst [[id number]]  Certification:: I certify this patient will need inpatient services for at least 2 midnights  Estimated Length of Stay: 2          B Medical/Surgery History Past Medical History:  Diagnosis Date   Anxiety    HIV (human immunodeficiency virus infection) (HCC)    Proteinuria    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HCC)    Vitamin D deficiency 10/2018   Past Surgical History:  Procedure Laterality Date   IR IMAGING GUIDED PORT INSERTION  08/29/2019   IR REMOVAL TUN ACCESS W/ PORT W/O FL MOD SED  08/30/2020   TEE WITHOUT CARDIOVERSION  N/A 08/30/2020   Procedure: TRANSESOPHAGEAL ECHOCARDIOGRAM (TEE);  Surgeon: [name], MD;  Location: [facility] ENDOSCOPY;  Service: Cardiovascular;  Laterality: N/A;     A IV Location/Drains/Wounds Patient Lines/Drains/Airways Status     Active Line/Drains/Airways     Name Placement date Placement time Site Days   Implanted Port Right Chest --  --  Chest  --            Intake/Output Last 24 hours No intake or output data in the 24 hours ending 09/25/22 0926  Labs/Imaging Results for orders placed or performed during the hospital encounter of 09/25/22 (from the past 48 hour(s))  CBC WITH DIFFERENTIAL     Status: Abnormal   Collection Time: 09/25/22  3:11 AM  Result Value Ref Range   WBC 12.5 (H) 4.0 - 10.5 K/uL    Comment: WHITE COUNT CONFIRMED ON SMEAR   RBC 2.04 (L) 4.22 - 5.81 MIL/uL   Hemoglobin 7.8 (L) 13.0 - 17.0 g/dL   HCT 30.8 (L) 65.7 - 84.6 %   MCV 106.9 (H) 80.0 - 100.0 fL   MCH 38.2 (H) 26.0 - 34.0 pg   MCHC 35.8 30.0 - 36.0 g/dL    Comment: CORRECTED FOR COLD AGGLUTININS   RDW 26.2 (H) 11.5 - 15.5 %   Platelets 387 150 - 400 K/uL   nRBC 103.3 (H) 0.0 - 0.2 %   Neutrophils Relative % 35 %   Neutro  Abs 4.4 1.7 - 7.7 K/uL   Lymphocytes Relative 48 %   Lymphs Abs 6.0 (H) 0.7 - 4.0 K/uL   Monocytes Relative 12 %   Monocytes Absolute 1.6 (H) 0.1 - 1.0 K/uL   Eosinophils Relative 3 %   Eosinophils Absolute 0.4 0.0 - 0.5 K/uL   Basophils Relative 1 %   Basophils Absolute 0.1 0.0 - 0.1 K/uL   Immature Granulocytes 1 %   Abs Immature Granulocytes 0.06 0.00 - 0.07 K/uL   Pappenheimer Bodies PRESENT    Carollee Massed Bodies PRESENT    Polychromasia PRESENT    Sickle Cells PRESENT     Comment: Performed at [facility], [address], [place], [place] [address]  Reticulocytes     Status: Abnormal   Collection Time: 09/25/22  3:11 AM  Result Value Ref Range   Retic Ct Pct >30.0 (H) 0.4 - 3.1 %   RBC. 2.00 (L) 4.22 - 5.81 MIL/uL   Retic  Count, Absolute 702.0 (H) 19.0 - 186.0 K/uL   Immature Retic Fract 30.8 (H) 2.3 - 15.9 %    Comment: Performed at [facility], [address] [place], [place] [address]  Comprehensive metabolic panel     Status: Abnormal   Collection Time: 09/25/22  3:11 AM  Result Value Ref Range   Sodium 139 135 - 145 mmol/L   Potassium 3.8 3.5 - 5.1 mmol/L   Chloride 106 98 - 111 mmol/L   CO2 23 22 - 32 mmol/L   Glucose, Bld 92 70 - 99 mg/dL    Comment: Glucose reference range applies only to samples taken after fasting for at least 8 hours.   BUN 11 6 - 20 mg/dL   Creatinine, Ser 8.29 0.61 - 1.24 mg/dL   Calcium 8.2 (L) 8.9 - 10.3 mg/dL   Total Protein 8.1 6.5 - 8.1 g/dL   Albumin 3.5 3.5 - 5.0 g/dL   AST 22 15 - 41 U/L   ALT 16 0 - 44 U/L   Alkaline Phosphatase 61 38 - 126 U/L   Total Bilirubin 6.3 (H) 0.3 - 1.2 mg/dL   GFR, Estimated >56 >21 mL/min    Comment: (NOTE) Calculated using the CKD-EPI Creatinine Equation (2021)    Anion gap 10 5 - 15    Comment: Performed at [facility], [address] [place], [place] [address]  Protime-INR     Status: Abnormal   Collection Time: 09/25/22  3:11 AM  Result Value Ref Range   Prothrombin Time 16.7 (H) 11.4 - 15.2 seconds   INR 1.3 (H) 0.8 - 1.2    Comment: (NOTE) INR goal varies based on device and disease states. Performed at [facility], [address] [place], [place] [address]    DG Chest 2 View  Result Date: 09/25/2022 CLINICAL DATA:  Sickle cell crisis and pain, initial encounter EXAM: CHEST - 2 VIEW COMPARISON:  09/14/2022 FINDINGS: Cardiac shadow is stable. Right chest wall port is again identified with a focal loop near the venous entry site. Diffuse interstitial changes are noted in the lungs bilaterally. No sizable effusion is seen. No bony abnormality is noted. IMPRESSION: Mild interstitial changes likely representing edema. Electronically Signed   By: Alcide Clever M.D.    On: 09/25/2022 03:17    Pending Labs [employer] (From admission, onward)     Start     Ordered   Signed and Held  CBC  Daily,  R      Signed and Held            Vitals/Pain Today's Vitals   09/25/22 0615 09/25/22 0630 09/25/22 0634 09/25/22 0645  BP: 117/80 112/70  118/87  Pulse: 77 80  84  Resp: 13 16    Temp:      TempSrc:      SpO2: 94% 94%  93%  Weight:      Height:      PainSc:   7      Isolation Precautions No active isolations  Medications Medications  dextrose 5 % and 0.45 % NaCl infusion ( Intravenous New Bag/Given 09/25/22 0328)  ondansetron (ZOFRAN) injection 4 mg (4 mg Intravenous Given 09/25/22 0330)  HYDROmorphone (DILAUDID) injection 2 mg (2 mg Intravenous Given 09/25/22 0330)  HYDROmorphone (DILAUDID) injection 2 mg (2 mg Intravenous Given 09/25/22 0401)  HYDROmorphone (DILAUDID) injection 2 mg (2 mg Intravenous Given 09/25/22 0440)  diphenhydrAMINE (BENADRYL) injection 25 mg (25 mg Intravenous Given 09/25/22 0414)  oxyCODONE-acetaminophen (PERCOCET/ROXICET) 5-325 MG per tablet 2 tablet (2 tablets Oral Given 09/25/22 0559)  HYDROmorphone (DILAUDID) injection 2 mg (2 mg Intravenous Given 09/25/22 5409)    Mobility walks     Focused Assessments    R Recommendations: See Admitting Provider Note  Report given to:   Additional Notes:

## 2022-09-25 NOTE — ED Triage Notes (Signed)
Patient reports sickle cell pain all over, states it has been going on all day. Reports pain 10/10 currently.

## 2022-09-25 NOTE — ED Provider Notes (Signed)
[place] EMERGENCY DEPARTMENT AT [facility] Provider Note   CSN: [id number] Arrival date & time: 09/25/22  0200     History  Chief Complaint  Patient presents with   Sickle Cell Pain Crisis    Martin [name] is a 32 y.o. male.  Patient with a history of sickle cell disease, chronic oxygen dependence of 4L, PE on Eliquis, HIV presenting with typical sickle cell pain involving his low back and legs for the past couple days.  States his home Percocet is not helping his pain.  Pain is in his low back with radiation to his bilateral hips and legs.  No fall or trauma.  Denies any fevers, chills, nausea, vomiting, chest pain or shortness of breath.   He feels that his breathing is at baseline Back pain is bilateral.  Not midline.  Denies any history of IV drug abuse or cancer.  No focal weakness, numbness or tingling.  No bowel or bladder incontinence.  Pain is typical of his sickle cell crises.  No fever or recent infection symptoms'.  he was recently admitted for the same pain on May 27.   The history is provided by the patient.  Sickle Cell Pain Crisis Associated symptoms: no chest pain, no congestion, no cough, no fever, no headaches, no nausea, no shortness of breath and no vomiting        Home Medications Prior to Admission medications   Medication Sig Start Date End Date Taking? Authorizing Provider  abacavir-dolutegravir-lamiVUDine (TRIUMEQ) 600-50-300 MG tablet Take 1 tablet by mouth at bedtime.    [provider]  apixaban (ELIQUIS) 5 MG TABS tablet Take 1 tablet (5 mg total) by mouth 2 (two) times daily. 12/02/21   Massie Maroon, [name]  folic acid (FOLVITE) 1 MG tablet Take 1 mg by mouth daily. 10/10/21   [provider]  gabapentin (NEURONTIN) 100 MG capsule Take 200 mg by mouth as needed (pain). 11/04/21   [provider]  hydroxyurea (HYDREA) 500 MG capsule Take 4 capsules (2,000 mg total) by mouth at bedtime. 10/07/20   Massie Maroon, [name]  naproxen (NAPROSYN) 500 MG tablet Take 500 mg by mouth 2 (two) times daily with a meal. Patient not taking: Reported on 09/22/2022    [provider]  oxyCODONE-acetaminophen (PERCOCET) 10-325 MG tablet Take 1 tablet by mouth every 4 hours as needed for pain. 09/26/22   Massie Maroon, [name]      Allergies    Ketorolac tromethamine, Tape, and Wound dressing adhesive    Review of Systems   Review of Systems  Constitutional:  Negative for activity change, appetite change and fever.  HENT:  Negative for congestion and rhinorrhea.   Respiratory:  Negative for cough and shortness of breath.   Cardiovascular:  Negative for chest pain.  Gastrointestinal:  Negative for abdominal pain, nausea and vomiting.  Musculoskeletal:  Positive for arthralgias, back pain and myalgias.  Skin:  Negative for rash.  Neurological:  Negative for dizziness, weakness and headaches.   all other systems are negative except as noted in the HPI and PMH.    Physical Exam Updated Vital Signs BP 98/65 (BP Location: Left Arm)   Pulse 87   Temp 98.2 F (36.8 C) (Oral)   Resp 18   Ht 6\' 3"  (1.905 m)   Wt 67.4 kg   SpO2 90%   BMI 18.57 kg/m  Physical Exam Vitals and nursing note reviewed.  Constitutional:  General: He is not in acute distress.    Appearance: He is well-developed.  HENT:     Head: Normocephalic and atraumatic.     Mouth/Throat:     Pharynx: No oropharyngeal exudate.  Eyes:     Conjunctiva/sclera: Conjunctivae normal.     Pupils: Pupils are equal, round, and reactive to light.  Neck:     Comments: No meningismus. Cardiovascular:     Rate and Rhythm: Normal rate and regular rhythm.     Heart sounds: Normal heart sounds. No murmur heard. Pulmonary:     Effort: Pulmonary effort is normal. No respiratory distress.     Breath sounds: Normal breath sounds.  Abdominal:     Palpations: Abdomen is soft.     Tenderness: There is no abdominal tenderness. There is no  guarding or rebound.  Musculoskeletal:        General: Tenderness present. Normal range of motion.     Cervical back: Normal range of motion and neck supple.     Comments: Paraspinal lumbar tenderness bilaterally.   5/5 strength in bilateral lower extremities. Ankle plantar and dorsiflexion intact. Great toe extension intact bilaterally. +2 DP and PT pulses. +2 patellar reflexes bilaterally. Normal gait.   Compartments are soft, full range of motion of bilateral hips, knees and ankles.  Skin:    General: Skin is warm.  Neurological:     Mental Status: He is alert and oriented to person, place, and time.     Cranial Nerves: No cranial nerve deficit.     Motor: No abnormal muscle tone.     Coordination: Coordination normal.     Comments: No ataxia on finger to nose bilaterally. No pronator drift. 5/5 strength throughout. CN 2-12 intact.Equal grip strength. Sensation intact.   Psychiatric:        Behavior: Behavior normal.     ED Results / Procedures / Treatments   Labs (all labs ordered are listed, but only abnormal results are displayed) Labs Reviewed  CBC WITH DIFFERENTIAL/PLATELET - Abnormal; Notable for the following components:      Result Value   WBC 12.5 (*)    RBC 2.04 (*)    Hemoglobin 7.8 (*)    HCT 21.8 (*)    MCV 106.9 (*)    MCH 38.2 (*)    RDW 26.2 (*)    nRBC 103.3 (*)    Lymphs Abs 6.0 (*)    Monocytes Absolute 1.6 (*)    All other components within normal limits  RETICULOCYTES - Abnormal; Notable for the following components:   Retic Ct Pct >30.0 (*)    RBC. 2.00 (*)    Retic Count, Absolute 702.0 (*)    Immature Retic Fract 30.8 (*)    All other components within normal limits  COMPREHENSIVE METABOLIC PANEL - Abnormal; Notable for the following components:   Calcium 8.2 (*)    Total Bilirubin 6.3 (*)    All other components within normal limits  PROTIME-INR - Abnormal; Notable for the following components:   Prothrombin Time 16.7 (*)    INR 1.3 (*)     All other components within normal limits    EKG EKG Interpretation  Date/Time:  Friday September 25 2022 03:45:40 EDT Ventricular Rate:  79 PR Interval:  171 QRS Duration: 96 QT Interval:  435 QTC Calculation: 499 R Axis:   87 Text Interpretation: Sinus rhythm Prolonged QT interval No significant change was found Confirmed by Glynn Octave [phone]) on 09/25/2022 3:56:48 AM  Radiology  DG Chest 2 View  Result Date: 09/25/2022 CLINICAL DATA:  Sickle cell crisis and pain, initial encounter EXAM: CHEST - 2 VIEW COMPARISON:  09/14/2022 FINDINGS: Cardiac shadow is stable. Right chest wall port is again identified with a focal loop near the venous entry site. Diffuse interstitial changes are noted in the lungs bilaterally. No sizable effusion is seen. No bony abnormality is noted. IMPRESSION: Mild interstitial changes likely representing edema. Electronically Signed   By: Alcide Clever M.D.   On: 09/25/2022 03:17    Procedures Procedures    Medications Ordered in ED Medications  dextrose 5 % and 0.45 % NaCl infusion (has no administration in time range)  HYDROmorphone (DILAUDID) injection 2 mg (has no administration in time range)  HYDROmorphone (DILAUDID) injection 2 mg (has no administration in time range)  HYDROmorphone (DILAUDID) injection 2 mg (has no administration in time range)  ondansetron (ZOFRAN) injection 4 mg (has no administration in time range)    ED Course/ Medical Decision Making/ A&P                             Medical Decision Making Amount and/or Complexity of Data Reviewed Labs: ordered. Decision-making details documented in ED Course. Radiology: ordered and independent interpretation performed. Decision-making details documented in ED Course. ECG/medicine tests: ordered and independent interpretation performed. Decision-making details documented in ED Course.  Risk Prescription drug management. Decision regarding hospitalization.   Typical sickle cell pain  involving low back and legs.  No fever.  No chest pain or shortness of breath.  No focal weakness, numbness or tingling.  Low concern for cord compression or cauda equina.   Patient treated per sickle cell protocol with pain medications per protocol.  Hemoglobin stable at 7.8.  Reticulocyte count is adequate.  EKG without acute ischemia.  X-ray does show questionable interstitial edema similar to previous.  Results reviewed interpreted by me.  Patient did have recent echocardiogram that showed normal ejection fraction.  Did also have recent CT PE study that showed no pulmonary embolism.  He denies any missed doses of Eliquis. CT of May 28 did show patchy groundglass opacities thought to be due to previous atypical infection.  Multiple areas of scarring and lymphadenopathy.  Borderline O2 saturation 90 to 91% on his home 4 L of oxygen  Patient does desaturate to the high 80s on his home 4 L of oxygen.  This was increased to 8 L high flow nasal cannula.  Recent CT PE study reviewed.  Low suspicion for pulmonary embolism, he states compliance with Eliquis.  No missed doses.   Still having significant pain and presents requesting admission for pain control as well as increased oxygen requirement.  Admission d/w Dr. Erenest Blank.           Final Clinical Impression(s) / ED Diagnoses Final diagnoses:  Sickle cell pain crisis (HCC)  Acute on chronic respiratory failure with hypoxia [facility])    Rx / DC Orders ED Discharge Orders     None         Allene Furuya, Jeannett Senior, MD 09/25/22 [phone]

## 2022-09-25 NOTE — H&P (Cosign Needed Addendum)
H&P  Patient Demographics:  Martin [name], is a 32 y.o. male  MRN: [id number]   DOB - 11-15-1990  Admit Date - 09/25/2022  Outpatient Primary MD for the patient is Massie Maroon, [name]  Chief Complaint  Patient presents with   Sickle Cell Pain Crisis      HPI:   Martin [name]  is a 32 y.o. male with history significant for sickle cell disease, chronic pain syndrome, opiate dependence and tolerance, history of HIV, chronic respiratory failure on home oxygen, and anemia of chronic disease presents with complaints of allover body pain that is consistent with previous sickle cell crisis.  Patient was recently hospitalized with sickle cell pain crisis, pain intensity improved and patient was discharged home.  He states that 3 days prior pain intensity increased and he has not identified any inciting factors.  He rates pain as 10/10, constant, and throbbing.  He endorses some shortness of breath and chest tightness.  He denies any headache, dizziness, fever, chills, or paresthesias.  No urinary symptoms, nausea, vomiting, or diarrhea.  ER course: While in ER, patient's oxygen saturation varied from 80s-90s.  Patient warranted a total of 6 L O2.  Complete blood count notable for WBCs 12.5, hemoglobin 7.8 g/dL, and platelets 119,147.  Complete metabolic panel shows bilirubin elevated at 7.8.  Robust reticulocytosis.  Chest x-ray shows no acute cardiopulmonary process.  Pain persists despite IV Dilaudid, and IV fluids.  Patient admitted for sickle cell pain crisis.   With Past History of the following :  Review of Systems  Constitutional: Negative.   HENT: Negative.    Respiratory:  Positive for shortness of breath.   Cardiovascular:  Negative for chest pain.  Genitourinary: Negative.   Musculoskeletal:  Positive for back pain and joint pain.  Skin: Negative.   Neurological: Negative.   Psychiatric/Behavioral: Negative.      Past Medical History:  Diagnosis Date   Anxiety    HIV  (human immunodeficiency virus infection) (HCC)    Proteinuria    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HCC)    Vitamin D deficiency 10/2018      Past Surgical History:  Procedure Laterality Date   IR IMAGING GUIDED PORT INSERTION  08/29/2019   IR REMOVAL TUN ACCESS W/ PORT W/O FL MOD SED  08/30/2020   TEE WITHOUT CARDIOVERSION N/A 08/30/2020   Procedure: TRANSESOPHAGEAL ECHOCARDIOGRAM (TEE);  Surgeon: [name], MD;  Location: [facility] ENDOSCOPY;  Service: Cardiovascular;  Laterality: N/A;     Social History:   Social History   Tobacco Use   Smoking status: Never   Smokeless tobacco: Never  Substance Use Topics   Alcohol use: No     Lives - At home   Family History :   Family History  Problem Relation Age of Onset   Sickle cell trait Mother    Sickle cell trait Father    Birth defects Maternal Grandmother    Birth defects Paternal Grandmother      Home Medications:   Prior to Admission medications   Medication Sig Start Date End Date Taking? Authorizing Provider  abacavir-dolutegravir-lamiVUDine (TRIUMEQ) 600-50-300 MG tablet Take 1 tablet by mouth at bedtime.   Yes [provider]  apixaban (ELIQUIS) 5 MG TABS tablet Take 1 tablet (5 mg total) by mouth 2 (two) times daily. 12/02/21  Yes Massie Maroon, [name]  folic acid (FOLVITE) 1 MG tablet Take 1 mg by mouth daily. 10/10/21  Yes [provider]  gabapentin (NEURONTIN) 100 MG capsule  Take 200 mg by mouth as needed (pain). 11/04/21  Yes [provider]  hydroxyurea (HYDREA) 500 MG capsule Take 4 capsules (2,000 mg total) by mouth at bedtime. 10/07/20  Yes Massie Maroon, [name]  oxyCODONE-acetaminophen (PERCOCET) 10-325 MG tablet Take 1 tablet by mouth every 4 hours as needed for pain. 09/26/22  Yes Massie Maroon, [name]     Allergies:   Allergies  Allergen Reactions   Ketorolac Tromethamine Swelling and Other (See Comments)    Patient reports facial edema and left arm edema after administration.   Tape  Rash and Other (See Comments)    PLEASE DO NOT USE THE CLEAR, THICK, "PLASTIC" TAPE- only paper tape is tolerated    Wound Dressing Adhesive Rash     Physical Exam:   Vitals:   Vitals:   09/25/22 1715 09/25/22 2032  BP:  103/63  Pulse:  95  Resp: 14 18  Temp:  98.1 F (36.7 C)  SpO2: 95% 90%    Physical Exam: Constitutional: Patient appears well-developed and well-nourished. Not in obvious distress. HENT: Normocephalic, atraumatic, External right and left ear normal. Oropharynx is clear and moist.  Eyes: Conjunctivae and EOM are normal. PERRLA, no scleral icterus. Neck: Normal ROM. Neck supple. No JVD. No tracheal deviation. No thyromegaly. CVS: RRR, S1/S2 +, no murmurs, no gallops, no carotid bruit.  Pulmonary: Effort and breath sounds normal, no stridor, rhonchi, wheezes, rales.  Abdominal: Soft. BS +, no distension, tenderness, rebound or guarding.  Musculoskeletal: Normal range of motion. No edema and no tenderness.  Lymphadenopathy: No lymphadenopathy noted, cervical, inguinal or axillary Neuro: Alert. Normal reflexes, muscle tone coordination. No cranial nerve deficit. Skin: Skin is warm and dry. No rash noted. Not diaphoretic. No erythema. No pallor. Psychiatric: Normal mood and affect. Behavior, judgment, thought content normal.   Data Review:   CBC Recent Labs  Lab 09/22/22 1145 09/22/22 1250 09/25/22 0311 09/25/22 1200  WBC DUPLICATE REQUEST 12.7* 12.5* 14.5*  HGB DUPLICATE REQUEST 7.4* 7.8* 7.6*  HCT DUPLICATE REQUEST 20.0* 21.8* 21.2*  PLT DUPLICATE REQUEST [phone]  MCV DUPLICATE REQUEST 105.3* 106.9* 107.6*  MCH DUPLICATE REQUEST 38.9* 38.2* 38.6*  MCHC DUPLICATE REQUEST 37.0* 35.8 35.8  RDW DUPLICATE REQUEST 27.9* 26.2* 25.8*  LYMPHSABS DUPLICATE REQUEST 7.2* 6.0*  --   MONOABS DUPLICATE REQUEST 1.4* 1.6*  --   EOSABS DUPLICATE REQUEST 0.3 0.4  --   BASOSABS DUPLICATE REQUEST 0.1 0.1  --     ------------------------------------------------------------------------------------------------------------------  Chemistries  Recent Labs  Lab 09/22/22 1145 09/25/22 0311  NA 135 139  K 4.2 3.8  CL 108 106  CO2 21* 23  GLUCOSE 81 92  BUN 12 11  CREATININE 0.70 1.06  CALCIUM 8.8* 8.2*  AST 22 22  ALT 26 16  ALKPHOS 69 61  BILITOT 7.0* 6.3*   ------------------------------------------------------------------------------------------------------------------ estimated creatinine clearance is 95.4 mL/min (by C-G formula based on SCr of 1.06 mg/dL). ------------------------------------------------------------------------------------------------------------------ No results for input(s): "TSH", "T4TOTAL", "T3FREE", "THYROIDAB" in the last 72 hours.  Invalid input(s): "FREET3"  Coagulation profile Recent Labs  Lab 09/25/22 0311  INR 1.3*   ------------------------------------------------------------------------------------------------------------------- No results for input(s): "DDIMER" in the last 72 hours. -------------------------------------------------------------------------------------------------------------------  Cardiac Enzymes No results for input(s): "CKMB", "TROPONINI", "MYOGLOBIN" in the last 168 hours.  Invalid input(s): "CK" ------------------------------------------------------------------------------------------------------------------    Component Value Date/Time   BNP 91.4 05/27/2021 2216    ---------------------------------------------------------------------------------------------------------------  Urinalysis    Component Value Date/Time   COLORURINE YELLOW 02/20/2022 0827   APPEARANCEUR  CLEAR 02/20/2022 0827   LABSPEC 1.006 02/20/2022 0827   PHURINE 5.0 02/20/2022 0827   GLUCOSEU NEGATIVE 02/20/2022 0827   HGBUR NEGATIVE 02/20/2022 0827   BILIRUBINUR NEGATIVE 02/20/2022 0827   BILIRUBINUR small (A) 03/18/2021 1218   BILIRUBINUR neg  11/27/2019 1149   KETONESUR NEGATIVE 02/20/2022 0827   PROTEINUR NEGATIVE 02/20/2022 0827   UROBILINOGEN 2.0 (A) 03/18/2021 1218   UROBILINOGEN 4.0 (H) 05/21/2009 1944   NITRITE NEGATIVE 02/20/2022 0827   LEUKOCYTESUR NEGATIVE 02/20/2022 0827    ----------------------------------------------------------------------------------------------------------------   Imaging Results:    CT CHEST WO CONTRAST  Result Date: 09/25/2022 CLINICAL DATA:  Dyspnea, concern for sickle cell crisis EXAM: CT CHEST WITHOUT CONTRAST TECHNIQUE: Multidetector CT imaging of the chest was performed following the standard protocol without IV contrast. RADIATION DOSE REDUCTION: This exam was performed according to the departmental dose-optimization program which includes automated exposure control, adjustment of the mA and/or kV according to patient size and/or use of iterative reconstruction technique. COMPARISON:  Chest CT dated Sep 15, 2022 FINDINGS: Cardiovascular: Mild cardiomegaly. No pericardial effusion. Normal caliber thoracic aorta no significant atherosclerotic disease. No coronary artery calcifications. Right chest wall port with tip in the SVC. Mediastinum/Nodes: Esophagus and thyroid are unremarkable. No enlarged lymph nodes seen in the chest. Lungs/Pleura: Central airways are patent. Bilateral mosaic attenuation. Redemonstrated patchy peribronchovascular predominant ground-glass opacities, new areas of ground-glass attenuation are seen in the upper lobes. Lower lung predominant scattered linear opacities, likely due to scarring. No pleural effusion or pneumothorax. Upper Abdomen: No acute abnormality. Musculoskeletal: No chest wall mass or suspicious bone lesions identified. IMPRESSION: 1. Redemonstrated patchy peribronchovascular predominant ground-glass opacities, new areas of ground-glass attenuation are seen in the upper lobes. Findings are nonspecific but can be seen in the setting of acute chest syndrome. 2.  Bilateral mosaic attenuation, likely due to mosaic perfusion. 3. Unchanged bilateral lower lung predominant scarring. Electronically Signed   By: Allegra Lai M.D.   On: 09/25/2022 15:14   DG Chest 2 View  Result Date: 09/25/2022 CLINICAL DATA:  Sickle cell crisis and pain, initial encounter EXAM: CHEST - 2 VIEW COMPARISON:  09/14/2022 FINDINGS: Cardiac shadow is stable. Right chest wall port is again identified with a focal loop near the venous entry site. Diffuse interstitial changes are noted in the lungs bilaterally. No sizable effusion is seen. No bony abnormality is noted. IMPRESSION: Mild interstitial changes likely representing edema. Electronically Signed   By: Alcide Clever M.D.   On: 09/25/2022 03:17     Assessment & Plan:  Principal Problem:   Sickle cell pain crisis (HCC) Active Problems:   Human immunodeficiency virus (HIV) disease (HCC)   Chronic pain syndrome   Anemia of chronic disease   Hyperbilirubinemia   Acute chest syndrome/sickle cell pain crisis: Admit patient.  CT of chest shows: IMPRESSION: 1. Redemonstrated patchy peribronchovascular predominant ground-glass opacities, new areas of ground-glass attenuation are seen in the upper lobes. Findings are nonspecific but can be seen in the setting of acute chest syndrome. 2. Bilateral mosaic attenuation, likely due to mosaic perfusion. 3. Unchanged bilateral lower lung predominant scarring. Initiate IV antibiotics.  Blood transfusion today, transfuse 1 unit PRBCs to maintain hemoglobin above 8.  Continue pain control with IV Dilaudid PCA with settings of 0.5 mg, 10-minute lockout, milligrams per hour. Hold Toradol, patient has listed allergy. Monitor vital signs very closely, reevaluate pain scale regularly, and supplemental oxygen as needed.  Patient will be reevaluated for pain in the context of function and relationship to  baseline as care progresses.  Anemia of chronic disease: Hemoglobin 7.8 g/dL.  Will  transfuse 1 unit PRBCs to maintain hemoglobin greater than 8.  Continue hydroxyurea and folic acid.  History of HIV: Patient states that he has been taking medications consistently.  However, according to fill history patient has not picked up medication in quite some time.  Will review CD4 count as results become available.  Acute on chronic respiratory failure with hypoxia: Patient is on home oxygen.  During admission, he is requiring upwards of 6 L oxygen.  Will continue supplemental oxygen and wean as tolerated.      DVT Prophylaxis: Subcut Lovenox   AM Labs Ordered, also please review Full Orders  Family Communication: Admission, patient's condition and plan of care including tests being ordered have been discussed with the patient who indicate understanding and agree with the plan and Code Status.  Code Status: Full Code  Consults called: None    Admission status: Inpatient    Time spent in minutes : 30 minutes  [name]  APRN, [name], [name] Patient Care [facility] [address] [place], [place] [address] [phone]  09/25/2022 at 9:37 PM

## 2022-09-26 DIAGNOSIS — D5700 Hb-SS disease with crisis, unspecified: Secondary | ICD-10-CM | POA: Diagnosis not present

## 2022-09-26 LAB — CBC
HCT: 24.6 % — ABNORMAL LOW (ref 39.0–52.0)
Hemoglobin: 8.7 g/dL — ABNORMAL LOW (ref 13.0–17.0)
MCH: 36.4 pg — ABNORMAL HIGH (ref 26.0–34.0)
MCHC: 35.4 g/dL (ref 30.0–36.0)
MCV: 102.9 fL — ABNORMAL HIGH (ref 80.0–100.0)
Platelets: 396 10*3/uL (ref 150–400)
RBC: 2.39 MIL/uL — ABNORMAL LOW (ref 4.22–5.81)
RDW: 23.8 % — ABNORMAL HIGH (ref 11.5–15.5)
WBC: 10.1 10*3/uL (ref 4.0–10.5)
nRBC: 86.8 % — ABNORMAL HIGH (ref 0.0–0.2)

## 2022-09-26 LAB — BPAM RBC
Blood Product Expiration Date: 202407112359
Blood Product Expiration Date: 202407162359

## 2022-09-26 LAB — TYPE AND SCREEN: Unit division: 0

## 2022-09-26 LAB — PREPARE RBC (CROSSMATCH)

## 2022-09-26 MED ORDER — SODIUM CHLORIDE 0.9% IV SOLUTION: Freq: Once | INTRAVENOUS | Status: AC

## 2022-09-26 MED ORDER — ALBUTEROL SULFATE (2.5 MG/3ML) 0.083% IN NEBU
2.5000 mg | INHALATION_SOLUTION | RESPIRATORY_TRACT | Status: DC | PRN
  Administered 2022-09-26 – 2022-09-28 (×3): 2.5 mg via RESPIRATORY_TRACT
  Filled 2022-09-26 (×3): qty 3

## 2022-09-26 MED ORDER — ACETAMINOPHEN 325 MG PO TABS
650.0000 mg | ORAL_TABLET | ORAL | Status: DC | PRN
  Administered 2022-09-26: 650 mg via ORAL
  Filled 2022-09-26: qty 2

## 2022-09-26 MED ORDER — LIP MEDEX EX OINT
TOPICAL_OINTMENT | CUTANEOUS | Status: DC | PRN
  Administered 2022-09-27: 1 via TOPICAL
  Filled 2022-09-26 (×3): qty 7

## 2022-09-26 NOTE — Plan of Care (Signed)
  Problem: Education: Goal: Knowledge of vaso-occlusive preventative measures will improve Outcome: Progressing Goal: Awareness of infection prevention will improve Outcome: Progressing Goal: Awareness of signs and symptoms of anemia will improve Outcome: Progressing   

## 2022-09-26 NOTE — Progress Notes (Signed)
Subjective: Martin [name] is a 32 year old male with a medical history significant for sickle cell disease, chronic pain syndrome, opiate dependence and tolerance, history of PE on Eliquis, history of HIV, and history of anemia of chronic disease was admitted for acute chest syndrome in the setting of sickle cell pain crisis. Today, patient has an oxygen requirement of 6 L/min.  He is status post 1 unit PRBCs on yesterday.  Patient has increased work of breathing today.  He reports "chest tightness".  Also, patient is having allover body pain. He endorses fatigue and shortness of breath.  He denies headache, dizziness, urinary symptoms, nausea, vomiting, or diarrhea.  Objective:  Vital signs in last 24 hours:  Vitals:   09/26/22 0246 09/26/22 0715 09/26/22 0825 09/26/22 1338  BP: 107/70  101/64 105/70  Pulse: 78  (!) 52 86  Resp: 12 14    Temp: 98.9 F (37.2 C)  97.7 F (36.5 C) (!) 97.5 F (36.4 C)  TempSrc: Oral  Oral Oral  SpO2: 91% 93% 96% 97%  Weight:      Height:        Intake/Output from previous day:   Intake/Output Summary (Last 24 hours) at 09/26/2022 1507 Last data filed at 09/26/2022 1100 Gross per 24 hour  Intake 2284.52 ml  Output 3500 ml  Net -1215.48 ml    Physical Exam: General: Alert, awake, oriented x3, in no acute distress.  Ill-appearing. HEENT: [name]/AT PEERL, EOMI Neck: Trachea midline,  no masses, no thyromegal,y no JVD, no carotid bruit OROPHARYNX:  Moist, No exudate/ erythema/lesions.  Heart: Regular rate and rhythm, without murmurs, rubs, gallops, PMI non-displaced, no heaves or thrills on palpation.  Lungs: Coarse breath sounds.  Utilizing accessory muscles. Abdomen: Soft, nontender, nondistended, positive bowel sounds, no masses no hepatosplenomegaly noted..  Neuro: No focal neurological deficits noted cranial nerves II through XII grossly intact. DTRs 2+ bilaterally upper and lower extremities. Strength 5 out of 5 in bilateral upper and lower  extremities. Musculoskeletal: No warm swelling or erythema around joints, no spinal tenderness noted. Psychiatric: Patient alert and oriented x3, good insight and cognition, good recent to remote recall. Lymph node survey: No cervical axillary or inguinal lymphadenopathy noted.  Lab Results:  Basic Metabolic Panel:    Component Value Date/Time   NA 139 09/25/2022 0311   NA 141 03/31/2022 1228   K 3.8 09/25/2022 0311   CL 106 09/25/2022 0311   CO2 23 09/25/2022 0311   BUN 11 09/25/2022 0311   BUN 10 03/31/2022 1228   CREATININE 1.06 09/25/2022 0311   GLUCOSE 92 09/25/2022 0311   CALCIUM 8.2 (L) 09/25/2022 0311   CBC:    Component Value Date/Time   WBC 10.1 09/26/2022 0755   HGB 8.7 (L) 09/26/2022 0755   HGB 9.5 (L) 03/31/2022 1228   HCT 24.6 (L) 09/26/2022 0755   HCT 26.1 (L) 03/31/2022 1228   PLT 396 09/26/2022 0755   PLT 318 03/31/2022 1228   MCV 102.9 (H) 09/26/2022 0755   MCV 107 (H) 03/31/2022 1228   NEUTROABS 4.4 09/25/2022 0311   NEUTROABS 3.5 03/31/2022 1228   LYMPHSABS 6.0 (H) 09/25/2022 0311   LYMPHSABS 6.1 (H) 03/31/2022 1228   MONOABS 1.6 (H) 09/25/2022 0311   EOSABS 0.4 09/25/2022 0311   EOSABS 0.1 03/31/2022 1228   BASOSABS 0.1 09/25/2022 0311   BASOSABS 0.1 03/31/2022 1228    No results found for this or any previous visit (from the past 240 hour(s)).  Studies/Results: CT CHEST WO CONTRAST  Result Date: 09/25/2022 CLINICAL DATA:  Dyspnea, concern for sickle cell crisis EXAM: CT CHEST WITHOUT CONTRAST TECHNIQUE: Multidetector CT imaging of the chest was performed following the standard protocol without IV contrast. RADIATION DOSE REDUCTION: This exam was performed according to the departmental dose-optimization program which includes automated exposure control, adjustment of the mA and/or kV according to patient size and/or use of iterative reconstruction technique. COMPARISON:  Chest CT dated Sep 15, 2022 FINDINGS: Cardiovascular: Mild cardiomegaly. No  pericardial effusion. Normal caliber thoracic aorta no significant atherosclerotic disease. No coronary artery calcifications. Right chest wall port with tip in the SVC. Mediastinum/Nodes: Esophagus and thyroid are unremarkable. No enlarged lymph nodes seen in the chest. Lungs/Pleura: Central airways are patent. Bilateral mosaic attenuation. Redemonstrated patchy peribronchovascular predominant ground-glass opacities, new areas of ground-glass attenuation are seen in the upper lobes. Lower lung predominant scattered linear opacities, likely due to scarring. No pleural effusion or pneumothorax. Upper Abdomen: No acute abnormality. Musculoskeletal: No chest wall mass or suspicious bone lesions identified. IMPRESSION: 1. Redemonstrated patchy peribronchovascular predominant ground-glass opacities, new areas of ground-glass attenuation are seen in the upper lobes. Findings are nonspecific but can be seen in the setting of acute chest syndrome. 2. Bilateral mosaic attenuation, likely due to mosaic perfusion. 3. Unchanged bilateral lower lung predominant scarring. Electronically Signed   By: Allegra Lai M.D.   On: 09/25/2022 15:14   DG Chest 2 View  Result Date: 09/25/2022 CLINICAL DATA:  Sickle cell crisis and pain, initial encounter EXAM: CHEST - 2 VIEW COMPARISON:  09/14/2022 FINDINGS: Cardiac shadow is stable. Right chest wall port is again identified with a focal loop near the venous entry site. Diffuse interstitial changes are noted in the lungs bilaterally. No sizable effusion is seen. No bony abnormality is noted. IMPRESSION: Mild interstitial changes likely representing edema. Electronically Signed   By: Alcide Clever M.D.   On: 09/25/2022 03:17    Medications: Scheduled Meds:  abacavir-dolutegravir-lamiVUDine  1 tablet Oral QHS   apixaban  5 mg Oral BID   Chlorhexidine Gluconate Cloth  6 each Topical Daily   folic acid  1 mg Oral Daily   HYDROmorphone   Intravenous Q4H   hydroxyurea  2,000 mg  Oral QHS   senna-docusate  1 tablet Oral BID   Continuous Infusions:  cefTRIAXone (ROCEPHIN)  IV 1 g (09/25/22 1756)   dextrose 5 % and 0.45 % NaCl 75 mL/hr at 09/25/22 0328   PRN Meds:.diphenhydrAMINE, gabapentin, HYDROmorphone (DILAUDID) injection, lip balm, naloxone **AND** sodium chloride flush, ondansetron, polyethylene glycol  Consultants: none  Procedures: none  Antibiotics: IV ceftriaxone  Assessment/Plan: Principal Problem:   Sickle cell pain crisis (HCC) Active Problems:   Human immunodeficiency virus (HIV) disease (HCC)   Chronic pain syndrome   Anemia of chronic disease   Hyperbilirubinemia  Acute chest syndrome/sickle cell pain crisis: Patient has an oxygen requirement of 6 L/min.  Patient may benefit from simple exchange transfusion.  Removed 350 cc, transfused 2 units PRBCs to follow. Continue IV antibiotics IV fluid, 0.45% saline at 75 mL/h Pain control with IV Dilaudid PCA no changes in settings today Tylenol 650 mg every 6 hours as needed Incentive spirometer Supplemental oxygen, wean as tolerated Monitor vital signs very closely, reevaluate pain scale regularly, and supplemental oxygen as needed  Chronic pain syndrome: Continue home medications  Anemia of chronic disease: Refer to above  History of PE: Continue Eliquis  Acute on chronic hypoxia with respiratory failure: Continue supplemental oxygen at 6 L/min, wean to home  oxygen as tolerated  Code Status: Full Code Family Communication: N/A Disposition Plan: Not yet ready for discharge  [name]  APRN, [name], [name] [facility] [address] [place], [place] [address] [phone]  If 7PM-7AM, please contact night-coverage.  09/26/2022, 3:07 PM  LOS: 1 day

## 2022-09-27 DIAGNOSIS — D5700 Hb-SS disease with crisis, unspecified: Secondary | ICD-10-CM | POA: Diagnosis not present

## 2022-09-27 LAB — BASIC METABOLIC PANEL
Anion gap: 6 (ref 5–15)
BUN: 15 mg/dL (ref 6–20)
CO2: 26 mmol/L (ref 22–32)
Calcium: 8.2 mg/dL — ABNORMAL LOW (ref 8.9–10.3)
Chloride: 104 mmol/L (ref 98–111)
Creatinine, Ser: 0.86 mg/dL (ref 0.61–1.24)
GFR, Estimated: >60 mL/min (ref >60)
Glucose, Bld: 96 mg/dL (ref 70–99)
Potassium: 4.4 mmol/L (ref 3.5–5.1)
Sodium: 136 mmol/L (ref 135–145)

## 2022-09-27 LAB — CBC
HCT: 24.4 % — ABNORMAL LOW (ref 39.0–52.0)
Hemoglobin: 8.8 g/dL — ABNORMAL LOW (ref 13.0–17.0)
MCH: 35.8 pg — ABNORMAL HIGH (ref 26.0–34.0)
MCHC: 36.1 g/dL — ABNORMAL HIGH (ref 30.0–36.0)
MCV: 99.2 fL (ref 80.0–100.0)
Platelets: 400 10*3/uL (ref 150–400)
RBC: 2.46 MIL/uL — ABNORMAL LOW (ref 4.22–5.81)
RDW: 21.6 % — ABNORMAL HIGH (ref 11.5–15.5)
WBC: 9.9 10*3/uL (ref 4.0–10.5)
nRBC: 45.5 % — ABNORMAL HIGH (ref 0.0–0.2)

## 2022-09-27 LAB — LACTATE DEHYDROGENASE: LDH: 439 U/L — ABNORMAL HIGH (ref 98–192)

## 2022-09-27 MED ORDER — MELATONIN 5 MG PO TABS
5.0000 mg | ORAL_TABLET | Freq: Once | ORAL | Status: AC
  Administered 2022-09-27: 5 mg via ORAL
  Filled 2022-09-27: qty 1

## 2022-09-27 MED ORDER — BISACODYL 10 MG RE SUPP
10.0000 mg | Freq: Once | RECTAL | Status: AC
  Administered 2022-09-27: 10 mg via RECTAL
  Filled 2022-09-27: qty 1

## 2022-09-27 MED ORDER — OXYCODONE HCL 5 MG PO TABS
5.0000 mg | ORAL_TABLET | ORAL | Status: DC | PRN
  Administered 2022-09-27 – 2022-10-06 (×32): 5 mg via ORAL
  Filled 2022-09-27 (×32): qty 1

## 2022-09-27 MED ORDER — IPRATROPIUM-ALBUTEROL 0.5-2.5 (3) MG/3ML IN SOLN
3.0000 mL | Freq: Once | RESPIRATORY_TRACT | Status: AC
  Administered 2022-09-27: 3 mL via RESPIRATORY_TRACT
  Filled 2022-09-27: qty 3

## 2022-09-27 MED ORDER — OXYCODONE-ACETAMINOPHEN 5-325 MG PO TABS
1.0000 | ORAL_TABLET | ORAL | Status: DC | PRN
  Administered 2022-09-27 – 2022-10-06 (×32): 1 via ORAL
  Filled 2022-09-27 (×32): qty 1

## 2022-09-27 MED ORDER — AQUAPHOR EX OINT
TOPICAL_OINTMENT | Freq: Two times a day (BID) | CUTANEOUS | Status: DC | PRN
  Administered 2022-09-27 – 2022-09-29 (×2): 1 via TOPICAL
  Filled 2022-09-27: qty 50

## 2022-09-27 NOTE — Progress Notes (Signed)
Subjective: Martin [name] is a 32 year old male with a medical history significant for sickle cell disease, chronic pain syndrome, opiate dependence and tolerance, history of PE on Eliquis, history of HIV, and history of anemia of chronic disease was admitted for acute chest syndrome in the setting of sickle cell pain crisis. Today, patient's oxygen requirement has decreased to 4 L/min.  Oxygen saturation has remained above 90%.  Patient is status post simple exchange transfusion on 09/26/2022 without complication. He continues to report some chest tightness with breathing and wheezing today.  Pain intensity has improved to 7/10.  He endorses fatigue and shortness of breath.  He denies headache, dizziness, urinary symptoms, nausea, vomiting, or diarrhea.  Objective:  Vital signs in last 24 hours:  Vitals:   09/27/22 0514 09/27/22 0812 09/27/22 0931 09/27/22 1049  BP: 116/81  115/75   Pulse: 86  86   Resp: 18  10   Temp: (!) 97.4 F (36.3 C)  97.6 F (36.4 C)   TempSrc: Oral  Oral   SpO2: (!) 87% (!) 89% (!) 86% (!) 89%  Weight:      Height:        Intake/Output from previous day:   Intake/Output Summary (Last 24 hours) at 09/27/2022 1143 Last data filed at 09/27/2022 0900 Gross per 24 hour  Intake 2409.09 ml  Output 1300 ml  Net 1109.09 ml    Physical Exam: General: Alert, awake, oriented x3, in no acute distress.  Ill-appearing. HEENT: [place]/AT PEERL, EOMI Neck: Trachea midline,  no masses, no thyromegal,y no JVD, no carotid bruit OROPHARYNX:  Moist, No exudate/ erythema/lesions.  Heart: Regular rate and rhythm, without murmurs, rubs, gallops, PMI non-displaced, no heaves or thrills on palpation.  Lungs: Coarse breath sounds.  Utilizing accessory muscles. Abdomen: Soft, nontender, nondistended, positive bowel sounds, no masses no hepatosplenomegaly noted..  Neuro: No focal neurological deficits noted cranial nerves II through XII grossly intact. DTRs 2+ bilaterally upper and  lower extremities. Strength 5 out of 5 in bilateral upper and lower extremities. Musculoskeletal: No warm swelling or erythema around joints, no spinal tenderness noted. Psychiatric: Patient alert and oriented x3, good insight and cognition, good recent to remote recall. Lymph node survey: No cervical axillary or inguinal lymphadenopathy noted.  Lab Results:  Basic Metabolic Panel:    Component Value Date/Time   NA 136 09/27/2022 0525   NA 141 03/31/2022 1228   K 4.4 09/27/2022 0525   CL 104 09/27/2022 0525   CO2 26 09/27/2022 0525   BUN 15 09/27/2022 0525   BUN 10 03/31/2022 1228   CREATININE 0.86 09/27/2022 0525   GLUCOSE 96 09/27/2022 0525   CALCIUM 8.2 (L) 09/27/2022 0525   CBC:    Component Value Date/Time   WBC 9.9 09/27/2022 0525   HGB 8.8 (L) 09/27/2022 0525   HGB 9.5 (L) 03/31/2022 1228   HCT 24.4 (L) 09/27/2022 0525   HCT 26.1 (L) 03/31/2022 1228   PLT 400 09/27/2022 0525   PLT 318 03/31/2022 1228   MCV 99.2 09/27/2022 0525   MCV 107 (H) 03/31/2022 1228   NEUTROABS 4.4 09/25/2022 0311   NEUTROABS 3.5 03/31/2022 1228   LYMPHSABS 6.0 (H) 09/25/2022 0311   LYMPHSABS 6.1 (H) 03/31/2022 1228   MONOABS 1.6 (H) 09/25/2022 0311   EOSABS 0.4 09/25/2022 0311   EOSABS 0.1 03/31/2022 1228   BASOSABS 0.1 09/25/2022 0311   BASOSABS 0.1 03/31/2022 1228    No results found for this or any previous visit (from the past 240  hour(s)).  Studies/Results: CT CHEST WO CONTRAST  Result Date: 09/25/2022 CLINICAL DATA:  Dyspnea, concern for sickle cell crisis EXAM: CT CHEST WITHOUT CONTRAST TECHNIQUE: Multidetector CT imaging of the chest was performed following the standard protocol without IV contrast. RADIATION DOSE REDUCTION: This exam was performed according to the departmental dose-optimization program which includes automated exposure control, adjustment of the mA and/or kV according to patient size and/or use of iterative reconstruction technique. COMPARISON:  Chest CT dated  Sep 15, 2022 FINDINGS: Cardiovascular: Mild cardiomegaly. No pericardial effusion. Normal caliber thoracic aorta no significant atherosclerotic disease. No coronary artery calcifications. Right chest wall port with tip in the SVC. Mediastinum/Nodes: Esophagus and thyroid are unremarkable. No enlarged lymph nodes seen in the chest. Lungs/Pleura: Central airways are patent. Bilateral mosaic attenuation. Redemonstrated patchy peribronchovascular predominant ground-glass opacities, new areas of ground-glass attenuation are seen in the upper lobes. Lower lung predominant scattered linear opacities, likely due to scarring. No pleural effusion or pneumothorax. Upper Abdomen: No acute abnormality. Musculoskeletal: No chest wall mass or suspicious bone lesions identified. IMPRESSION: 1. Redemonstrated patchy peribronchovascular predominant ground-glass opacities, new areas of ground-glass attenuation are seen in the upper lobes. Findings are nonspecific but can be seen in the setting of acute chest syndrome. 2. Bilateral mosaic attenuation, likely due to mosaic perfusion. 3. Unchanged bilateral lower lung predominant scarring. Electronically Signed   By: Allegra Lai M.D.   On: 09/25/2022 15:14    Medications: Scheduled Meds:  abacavir-dolutegravir-lamiVUDine  1 tablet Oral QHS   apixaban  5 mg Oral BID   Chlorhexidine Gluconate Cloth  6 each Topical Daily   folic acid  1 mg Oral Daily   HYDROmorphone   Intravenous Q4H   hydroxyurea  2,000 mg Oral QHS   senna-docusate  1 tablet Oral BID   Continuous Infusions:  cefTRIAXone (ROCEPHIN)  IV Stopped (09/26/22 1825)   dextrose 5 % and 0.45 % NaCl 75 mL/hr at 09/27/22 0519   PRN Meds:.acetaminophen, albuterol, diphenhydrAMINE, gabapentin, lip balm, mineral oil-hydrophilic petrolatum, naloxone **AND** sodium chloride flush, ondansetron, polyethylene glycol  Consultants: none  Procedures: none  Antibiotics: IV ceftriaxone  Assessment/Plan: Principal  Problem:   Sickle cell pain crisis (HCC) Active Problems:   Human immunodeficiency virus (HIV) disease (HCC)   Chronic pain syndrome   Anemia of chronic disease   Hyperbilirubinemia  Acute chest syndrome/sickle cell pain crisis: Patient has an oxygen requirement of 6 L/min.   Patient is status post simple exchange transfusion on 09/26/2022.  Will monitor closely.  Labs in AM.  Continue IV antibiotics IV fluid, 0.45% saline at 75 mL/h Pain control with IV Dilaudid PCA no changes in settings today Tylenol 650 mg every 6 hours as needed Incentive spirometer Supplemental oxygen, wean as tolerated Monitor vital signs very closely, reevaluate pain scale regularly, and supplemental oxygen as needed  Chronic pain syndrome: Continue home medications  Anemia of chronic disease: Refer to above  History of PE: Continue Eliquis  Acute on chronic hypoxia with respiratory failure: Continue supplemental oxygen at 4 L/min, wean to home oxygen as tolerated  Code Status: Full Code Family Communication: N/A Disposition Plan: Not yet ready for discharge  [name]  APRN, [name], [name] [facility] [address] [place], [place] [address] [phone]  If 7PM-7AM, please contact night-coverage.  09/27/2022, 11:43 AM  LOS: 2 days

## 2022-09-28 DIAGNOSIS — D5700 Hb-SS disease with crisis, unspecified: Secondary | ICD-10-CM | POA: Diagnosis not present

## 2022-09-28 LAB — CBC
HCT: 23.6 % — ABNORMAL LOW (ref 39.0–52.0)
Hemoglobin: 8.4 g/dL — ABNORMAL LOW (ref 13.0–17.0)
MCH: 34.4 pg — ABNORMAL HIGH (ref 26.0–34.0)
MCHC: 35.6 g/dL (ref 30.0–36.0)
MCV: 96.7 fL (ref 80.0–100.0)
Platelets: 500 10*3/uL — ABNORMAL HIGH (ref 150–400)
RBC: 2.44 MIL/uL — ABNORMAL LOW (ref 4.22–5.81)
RDW: 21.2 % — ABNORMAL HIGH (ref 11.5–15.5)
WBC: 11.1 10*3/uL — ABNORMAL HIGH (ref 4.0–10.5)
nRBC: 18.9 % — ABNORMAL HIGH (ref 0.0–0.2)

## 2022-09-28 LAB — BPAM RBC
ISSUE DATE / TIME: 202406072350
ISSUE DATE / TIME: 202406082050
Unit Type and Rh: 5100

## 2022-09-28 LAB — CD4/CD8 (T-HELPER/T-SUPPRESSOR CELL)
CD4 absolute: 720 /uL (ref 400–1790)
CD4%: 31.01 % — ABNORMAL LOW (ref 33–65)
CD8 T Cell Abs: 895 /uL (ref 190–1000)
CD8tox: 38.56 % (ref 12–40)
Ratio: 0.8 — ABNORMAL LOW (ref 1.0–3.0)
Total lymphocyte count: 2320 /uL (ref 1000–4000)

## 2022-09-28 LAB — TYPE AND SCREEN
ABO/RH(D): O POS
Antibody Screen: NEGATIVE
Donor AG Type: NEGATIVE
Donor AG Type: NEGATIVE
Unit division: 0

## 2022-09-28 LAB — RETICULOCYTES
Immature Retic Fract: 6.7 % (ref 2.3–15.9)
RBC.: 2.5 MIL/uL — ABNORMAL LOW (ref 4.22–5.81)
Retic Count, Absolute: 718 10*3/uL — ABNORMAL HIGH (ref 19.0–186.0)
Retic Ct Pct: 28.7 % — ABNORMAL HIGH (ref 0.4–3.1)

## 2022-09-28 MED ORDER — ALPRAZOLAM 0.25 MG PO TABS
0.2500 mg | ORAL_TABLET | Freq: Every day | ORAL | Status: AC
  Administered 2022-09-28 – 2022-09-29 (×2): 0.25 mg via ORAL
  Filled 2022-09-28 (×2): qty 1

## 2022-09-28 NOTE — Progress Notes (Signed)
Subjective: Martin [name] is a 32 year old male with a medical history significant for sickle cell disease, chronic pain syndrome, opiate dependence and tolerance, history of PE on Eliquis, history of HIV, and history of anemia of chronic disease was admitted for acute chest syndrome in the setting of sickle cell pain crisis. Patient has no new complaints on today.  He continues to have allover body pain.  Patient rates pain as 7/10.  He has an oxygen requirement of 6 L/min.  Patient is status post exchange transfusion 2 days ago.  He denies headache, dizziness, urinary symptoms, nausea, vomiting, or diarrhea.  Objective:  Vital signs in last 24 hours:  Vitals:   09/28/22 1020 09/28/22 1425 09/28/22 1441 09/28/22 1700  BP: (!) 116/59 123/68    Pulse: 97 (!) 110    Resp: 18 18    Temp: 98.3 F (36.8 C) 98.4 F (36.9 C)    TempSrc:      SpO2: (!) 89% (!) 64% 91% 93%  Weight:      Height:        Intake/Output from previous day:   Intake/Output Summary (Last 24 hours) at 09/28/2022 1824 Last data filed at 09/28/2022 1735 Gross per 24 hour  Intake 480 ml  Output 3375 ml  Net -2895 ml    Physical Exam: General: Alert, awake, oriented x3, in no acute distress.  Ill-appearing. HEENT: [place]/AT PEERL, EOMI Neck: Trachea midline,  no masses, no thyromegal,y no JVD, no carotid bruit OROPHARYNX:  Moist, No exudate/ erythema/lesions.  Heart: Regular rate and rhythm, without murmurs, rubs, gallops, PMI non-displaced, no heaves or thrills on palpation.  Lungs: Coarse breath sounds.  Utilizing accessory muscles. Abdomen: Soft, nontender, nondistended, positive bowel sounds, no masses no hepatosplenomegaly noted..  Neuro: No focal neurological deficits noted cranial nerves II through XII grossly intact. DTRs 2+ bilaterally upper and lower extremities. Strength 5 out of 5 in bilateral upper and lower extremities. Musculoskeletal: No warm swelling or erythema around joints, no spinal tenderness  noted. Psychiatric: Patient alert and oriented x3, good insight and cognition, good recent to remote recall. Lymph node survey: No cervical axillary or inguinal lymphadenopathy noted.  Lab Results:  Basic Metabolic Panel:    Component Value Date/Time   NA 136 09/27/2022 0525   NA 141 03/31/2022 1228   K 4.4 09/27/2022 0525   CL 104 09/27/2022 0525   CO2 26 09/27/2022 0525   BUN 15 09/27/2022 0525   BUN 10 03/31/2022 1228   CREATININE 0.86 09/27/2022 0525   GLUCOSE 96 09/27/2022 0525   CALCIUM 8.2 (L) 09/27/2022 0525   CBC:    Component Value Date/Time   WBC 11.1 (H) 09/28/2022 0500   HGB 8.4 (L) 09/28/2022 0500   HGB 9.5 (L) 03/31/2022 1228   HCT 23.6 (L) 09/28/2022 0500   HCT 26.1 (L) 03/31/2022 1228   PLT 500 (H) 09/28/2022 0500   PLT 318 03/31/2022 1228   MCV 96.7 09/28/2022 0500   MCV 107 (H) 03/31/2022 1228   NEUTROABS 4.4 09/25/2022 0311   NEUTROABS 3.5 03/31/2022 1228   LYMPHSABS 6.0 (H) 09/25/2022 0311   LYMPHSABS 6.1 (H) 03/31/2022 1228   MONOABS 1.6 (H) 09/25/2022 0311   EOSABS 0.4 09/25/2022 0311   EOSABS 0.1 03/31/2022 1228   BASOSABS 0.1 09/25/2022 0311   BASOSABS 0.1 03/31/2022 1228    No results found for this or any previous visit (from the past 240 hour(s)).  Studies/Results: No results found.  Medications: Scheduled Meds:  abacavir-dolutegravir-lamiVUDine  1  tablet Oral QHS   ALPRAZolam  0.25 mg Oral QHS   apixaban  5 mg Oral BID   Chlorhexidine Gluconate Cloth  6 each Topical Daily   folic acid  1 mg Oral Daily   HYDROmorphone   Intravenous Q4H   hydroxyurea  2,000 mg Oral QHS   senna-docusate  1 tablet Oral BID   Continuous Infusions:  cefTRIAXone (ROCEPHIN)  IV 1 g (09/28/22 1709)   dextrose 5 % and 0.45 % NaCl 20 mL/hr at 09/27/22 1148   PRN Meds:.acetaminophen, albuterol, diphenhydrAMINE, gabapentin, lip balm, mineral oil-hydrophilic petrolatum, naloxone **AND** sodium chloride flush, ondansetron, oxyCODONE-acetaminophen **AND**  oxyCODONE, polyethylene glycol  Consultants: none  Procedures: none  Antibiotics: IV ceftriaxone  Assessment/Plan: Principal Problem:   Sickle cell pain crisis (HCC) Active Problems:   Human immunodeficiency virus (HIV) disease (HCC)   Chronic pain syndrome   Anemia of chronic disease   Hyperbilirubinemia  Acute chest syndrome/sickle cell pain crisis: Patient has an oxygen requirement of 6 L/min.   Patient is status post simple exchange transfusion on 09/26/2022.  Will monitor closely.  Labs in AM.  Continue IV antibiotics IV fluid, 0.45% saline at 75 mL/h Pain control with IV Dilaudid PCA no changes in settings today Tylenol 650 mg every 6 hours as needed Incentive spirometer Supplemental oxygen, wean as tolerated Monitor vital signs very closely, reevaluate pain scale regularly, and supplemental oxygen as needed  Chronic pain syndrome: Continue home medications  Anemia of chronic disease: Refer to above  History of PE: Continue Eliquis  Acute on chronic hypoxia with respiratory failure: Continue supplemental oxygen at 4 L/min, wean to home oxygen as tolerated  Code Status: Full Code Family Communication: N/A Disposition Plan: Not yet ready for discharge  [name]  APRN, [name], [name] [facility] [address] [place], [place] [address] [phone]  If 7PM-7AM, please contact night-coverage.  09/28/2022, 6:24 PM  LOS: 3 days

## 2022-09-29 DIAGNOSIS — D5700 Hb-SS disease with crisis, unspecified: Secondary | ICD-10-CM | POA: Diagnosis not present

## 2022-09-29 LAB — CBC
HCT: 23.5 % — ABNORMAL LOW (ref 39.0–52.0)
Hemoglobin: 8.3 g/dL — ABNORMAL LOW (ref 13.0–17.0)
MCH: 34.2 pg — ABNORMAL HIGH (ref 26.0–34.0)
MCHC: 35.3 g/dL (ref 30.0–36.0)
MCV: 96.7 fL (ref 80.0–100.0)
Platelets: 568 10*3/uL — ABNORMAL HIGH (ref 150–400)
RBC: 2.43 MIL/uL — ABNORMAL LOW (ref 4.22–5.81)
RDW: 22 % — ABNORMAL HIGH (ref 11.5–15.5)
WBC: 10.4 10*3/uL (ref 4.0–10.5)
nRBC: 14.4 % — ABNORMAL HIGH (ref 0.0–0.2)

## 2022-09-29 LAB — LACTATE DEHYDROGENASE: LDH: 509 U/L — ABNORMAL HIGH (ref 98–192)

## 2022-09-29 LAB — HEPATIC FUNCTION PANEL
ALT: 14 U/L (ref 0–44)
AST: 28 U/L (ref 15–41)
Albumin: 3.6 g/dL (ref 3.5–5.0)
Alkaline Phosphatase: 56 U/L (ref 38–126)
Bilirubin, Direct: 0.2 mg/dL (ref 0.0–0.2)
Indirect Bilirubin: 3.6 mg/dL — ABNORMAL HIGH (ref 0.3–0.9)
Total Bilirubin: 3.8 mg/dL — ABNORMAL HIGH (ref 0.3–1.2)
Total Protein: 8.2 g/dL — ABNORMAL HIGH (ref 6.5–8.1)

## 2022-09-29 MED ORDER — CAMPHOR-MENTHOL 0.5-0.5 % EX LOTN
TOPICAL_LOTION | CUTANEOUS | Status: DC | PRN
  Filled 2022-09-29: qty 222

## 2022-09-29 NOTE — Progress Notes (Signed)
Subjective: Martin [name] is a 32 year old male with a medical history significant for sickle cell disease, chronic pain syndrome, opiate dependence and tolerance, history of PE on Eliquis, history of HIV, and history of anemia of chronic disease was admitted for acute chest syndrome in the setting of sickle cell pain crisis. Patient continues to warrant between 4-6 L supplemental oxygen per minute.  On ambulating pulse ox, oxygen saturations range between 80-89% on 6 L.  There is been no improvement in patient's oxygen saturation over the past several days.    Patient has no new complaints on today.  He continues to have allover body pain.  Patient rates pain as 7/10.    He denies headache, dizziness, urinary symptoms, nausea, vomiting, or diarrhea.  Objective:  Vital signs in last 24 hours:  Vitals:   10/01/22 1026 10/01/22 1230 10/01/22 1427 10/01/22 1508  BP: (!) 110/59  121/66   Pulse: 67  81   Resp: 16     Temp: 98.6 F (37 C)     TempSrc: Oral     SpO2: 93% 94% 92% 90%  Weight:      Height:        Intake/Output from previous day:   Intake/Output Summary (Last 24 hours) at 10/01/2022 1702 Last data filed at 10/01/2022 1237 Gross per 24 hour  Intake 391.91 ml  Output 1425 ml  Net -1033.09 ml    Physical Exam: General: Alert, awake, oriented x3, in no acute distress.  Ill-appearing. HEENT: [place]/AT PEERL, EOMI Neck: Trachea midline,  no masses, no thyromegal,y no JVD, no carotid bruit OROPHARYNX:  Moist, No exudate/ erythema/lesions.  Heart: Regular rate and rhythm, without murmurs, rubs, gallops, PMI non-displaced, no heaves or thrills on palpation.  Lungs: Coarse breath sounds.  Utilizing accessory muscles. Abdomen: Soft, nontender, nondistended, positive bowel sounds, no masses no hepatosplenomegaly noted..  Neuro: No focal neurological deficits noted cranial nerves II through XII grossly intact. DTRs 2+ bilaterally upper and lower extremities. Strength 5 out of 5 in  bilateral upper and lower extremities. Musculoskeletal: No warm swelling or erythema around joints, no spinal tenderness noted. Psychiatric: Patient alert and oriented x3, good insight and cognition, good recent to remote recall. Lymph node survey: No cervical axillary or inguinal lymphadenopathy noted.  Lab Results:  Basic Metabolic Panel:    Component Value Date/Time   NA 136 09/30/2022 1451   NA 141 03/31/2022 1228   K 4.3 09/30/2022 1451   CL 102 09/30/2022 1451   CO2 29 09/30/2022 1451   BUN 15 09/30/2022 1451   BUN 10 03/31/2022 1228   CREATININE 0.97 09/30/2022 1451   GLUCOSE 90 09/30/2022 1451   CALCIUM 8.6 (L) 09/30/2022 1451   CBC:    Component Value Date/Time   WBC 10.8 (H) 09/30/2022 1451   HGB 8.4 (L) 09/30/2022 1451   HGB 9.5 (L) 03/31/2022 1228   HCT 24.2 (L) 09/30/2022 1451   HCT 26.1 (L) 03/31/2022 1228   PLT 763 (H) 09/30/2022 1451   PLT 318 03/31/2022 1228   MCV 96.4 09/30/2022 1451   MCV 107 (H) 03/31/2022 1228   NEUTROABS 4.4 09/25/2022 0311   NEUTROABS 3.5 03/31/2022 1228   LYMPHSABS 6.0 (H) 09/25/2022 0311   LYMPHSABS 6.1 (H) 03/31/2022 1228   MONOABS 1.6 (H) 09/25/2022 0311   EOSABS 0.4 09/25/2022 0311   EOSABS 0.1 03/31/2022 1228   BASOSABS 0.1 09/25/2022 0311   BASOSABS 0.1 03/31/2022 1228    No results found for this or any previous  visit (from the past 240 hour(s)).  Studies/Results: No results found.  Medications: Scheduled Meds:  abacavir-dolutegravir-lamiVUDine  1 tablet Oral QHS   ALPRAZolam  0.25 mg Oral QHS   apixaban  5 mg Oral BID   Chlorhexidine Gluconate Cloth  6 each Topical Daily   folic acid  1 mg Oral Daily   HYDROmorphone   Intravenous Q4H   hydroxyurea  2,000 mg Oral QHS   polyethylene glycol  17 g Oral Daily   Continuous Infusions:  ceFEPime (MAXIPIME) IV 2 g (10/01/22 1507)   dextrose 5 % and 0.45 % NaCl 20 mL/hr at 10/01/22 0337   PRN Meds:.acetaminophen, albuterol, diphenhydrAMINE, gabapentin, Glycerin  (Adult), hydrocortisone cream, lip balm, mineral oil-hydrophilic petrolatum, naloxone **AND** sodium chloride flush, ondansetron, oxyCODONE-acetaminophen **AND** oxyCODONE  Consultants: none  Procedures: none  Antibiotics: IV ceftriaxone  Assessment/Plan: Principal Problem:   Sickle cell pain crisis (HCC) Active Problems:   Human immunodeficiency virus (HIV) disease (HCC)   Chronic pain syndrome   Anemia of chronic disease   Hyperbilirubinemia  Acute chest syndrome/sickle cell pain crisis: Patient has an oxygen requirement of 6 L/min.   Patient is status post simple exchange transfusion on 09/26/2022.  Will monitor closely.  Labs in AM.  Continue IV antibiotics Continue IV fluids to KVO Pain control with IV Dilaudid PCA no changes in settings today Tylenol 650 mg every 6 hours as needed Incentive spirometer Supplemental oxygen, wean as tolerated Monitor vital signs very closely, reevaluate pain scale regularly, and supplemental oxygen as needed  Chronic pain syndrome: Continue home medications  Anemia of chronic disease: Refer to above  History of PE: Continue Eliquis  Acute on chronic hypoxia with respiratory failure: Continue supplemental oxygen at 6 L/min, wean to home oxygen as tolerated  Code Status: Full Code Family Communication: N/A Disposition Plan: Not yet ready for discharge  [name]  APRN, [name], [name] [facility] [address] [place], [place] [address] [phone]  If 7PM-7AM, please contact night-coverage.  10/01/2022, 5:02 PM  LOS: 6 days

## 2022-09-30 DIAGNOSIS — D5700 Hb-SS disease with crisis, unspecified: Secondary | ICD-10-CM | POA: Diagnosis not present

## 2022-09-30 LAB — COMPREHENSIVE METABOLIC PANEL
ALT: 14 U/L (ref 0–44)
AST: 29 U/L (ref 15–41)
Albumin: 3.4 g/dL — ABNORMAL LOW (ref 3.5–5.0)
Alkaline Phosphatase: 58 U/L (ref 38–126)
Anion gap: 5 (ref 5–15)
BUN: 15 mg/dL (ref 6–20)
CO2: 29 mmol/L (ref 22–32)
Calcium: 8.6 mg/dL — ABNORMAL LOW (ref 8.9–10.3)
Chloride: 102 mmol/L (ref 98–111)
Creatinine, Ser: 0.97 mg/dL (ref 0.61–1.24)
GFR, Estimated: >60 mL/min (ref >60)
Glucose, Bld: 90 mg/dL (ref 70–99)
Potassium: 4.3 mmol/L (ref 3.5–5.1)
Sodium: 136 mmol/L (ref 135–145)
Total Bilirubin: 3.3 mg/dL — ABNORMAL HIGH (ref 0.3–1.2)
Total Protein: 8.7 g/dL — ABNORMAL HIGH (ref 6.5–8.1)

## 2022-09-30 LAB — TYPE AND SCREEN: Antibody Screen: NEGATIVE

## 2022-09-30 LAB — CBC
HCT: 24.2 % — ABNORMAL LOW (ref 39.0–52.0)
Hemoglobin: 8.4 g/dL — ABNORMAL LOW (ref 13.0–17.0)
MCH: 33.5 pg (ref 26.0–34.0)
MCHC: 34.7 g/dL (ref 30.0–36.0)
MCV: 96.4 fL (ref 80.0–100.0)
Platelets: 763 10*3/uL — ABNORMAL HIGH (ref 150–400)
RBC: 2.51 MIL/uL — ABNORMAL LOW (ref 4.22–5.81)
RDW: 22.7 % — ABNORMAL HIGH (ref 11.5–15.5)
WBC: 10.8 10*3/uL — ABNORMAL HIGH (ref 4.0–10.5)
nRBC: 12.9 % — ABNORMAL HIGH (ref 0.0–0.2)

## 2022-09-30 LAB — LACTATE DEHYDROGENASE: LDH: 472 U/L — ABNORMAL HIGH (ref 98–192)

## 2022-09-30 LAB — STREP PNEUMONIAE URINARY ANTIGEN: Strep Pneumo Urinary Antigen: NEGATIVE

## 2022-09-30 MED ORDER — ALBUTEROL SULFATE (2.5 MG/3ML) 0.083% IN NEBU
2.5000 mg | INHALATION_SOLUTION | RESPIRATORY_TRACT | Status: DC | PRN
  Administered 2022-10-01: 2.5 mg via RESPIRATORY_TRACT
  Filled 2022-09-30: qty 3

## 2022-09-30 MED ORDER — ALBUTEROL SULFATE (2.5 MG/3ML) 0.083% IN NEBU
2.5000 mg | INHALATION_SOLUTION | RESPIRATORY_TRACT | Status: DC
  Filled 2022-09-30 (×2): qty 3

## 2022-09-30 MED ORDER — GLYCERIN (LAXATIVE) 2.1 G RE SUPP
1.0000 | Freq: Every day | RECTAL | Status: DC | PRN
  Administered 2022-09-30 – 2022-10-05 (×3): 1 via RECTAL
  Filled 2022-09-30 (×7): qty 1

## 2022-09-30 MED ORDER — HYDROCORTISONE 0.5 % EX CREA
TOPICAL_CREAM | Freq: Two times a day (BID) | CUTANEOUS | Status: DC | PRN
  Filled 2022-09-30: qty 28.35

## 2022-09-30 MED ORDER — ALPRAZOLAM 0.25 MG PO TABS
0.2500 mg | ORAL_TABLET | Freq: Every day | ORAL | Status: AC
  Administered 2022-09-30 – 2022-10-01 (×2): 0.25 mg via ORAL
  Filled 2022-09-30 (×2): qty 1

## 2022-09-30 MED ORDER — POLYETHYLENE GLYCOL 3350 17 G PO PACK
17.0000 g | PACK | Freq: Every day | ORAL | Status: DC
  Administered 2022-10-04 – 2022-10-06 (×3): 17 g via ORAL
  Filled 2022-09-30 (×5): qty 1

## 2022-09-30 MED ORDER — SODIUM CHLORIDE 0.9 % IV SOLN
2.0000 g | Freq: Three times a day (TID) | INTRAVENOUS | Status: DC
  Administered 2022-09-30 – 2022-10-06 (×18): 2 g via INTRAVENOUS
  Filled 2022-09-30 (×18): qty 12.5

## 2022-09-30 NOTE — Progress Notes (Signed)
Pharmacy Antibiotic Note  Martin [name] is a 32 y.o. male admitted on 09/25/2022 with acute chest syndrome/ sickle cell pain crisis.  Pharmacy has been consulted for Cefepime dosing.  Plan: - Cefepime 2gm IV every 8 hours - Dosage appears stable and need for further dosage adjustment appears unlikely at present.    Pharmacy will sign off at this time - following peripherally for culture results, dose adjustments, and length of therapy.   Please reconsult if a change in clinical status warrants re-evaluation of dosage.    Height: 6\' 3"  (190.5 cm) Weight: 67.4 kg (148 lb 9.4 oz) IBW/kg (Calculated) : 84.5  Temp (24hrs), Avg:98.3 F (36.8 C), Min:98.1 F (36.7 C), Max:98.4 F (36.9 C)  Recent Labs  Lab 09/25/22 0311 09/25/22 1200 09/26/22 0755 09/27/22 0525 09/28/22 0500 09/29/22 0500  WBC 12.5* 14.5* 10.1 9.9 11.1* 10.4  CREATININE 1.06  --   --  0.86  --   --     Estimated Creatinine Clearance: 117.6 mL/min (by C-G formula based on SCr of 0.86 mg/dL).    Allergies  Allergen Reactions   Ketorolac Tromethamine Swelling and Other (See Comments)    Patient reports facial edema and left arm edema after administration.   Tape Rash and Other (See Comments)    PLEASE DO NOT USE THE CLEAR, THICK, "PLASTIC" TAPE- only paper tape is tolerated    Wound Dressing Adhesive Rash    Antimicrobials this admission: 6/7 Ceftriaxone >> 6/12  Microbiology results: None  Thank you for allowing pharmacy to be a part of this patient's care.  Nykole Matos Tylene Fantasia 09/30/2022 1:11 PM

## 2022-10-01 ENCOUNTER — Inpatient Hospital Stay: Payer: Self-pay | Admitting: Family Medicine

## 2022-10-01 DIAGNOSIS — D5700 Hb-SS disease with crisis, unspecified: Secondary | ICD-10-CM | POA: Diagnosis not present

## 2022-10-01 NOTE — Progress Notes (Signed)
Subjective: Martin [name] is a 32 year old male with a medical history significant for sickle cell disease, chronic pain syndrome, opiate dependence and tolerance, history of PE on Eliquis, history of HIV, and history of anemia of chronic disease was admitted for acute chest syndrome in the setting of sickle cell pain crisis. Patient continues to warrant between 4-6 L supplemental oxygen per minute.  On ambulating pulse ox, oxygen saturations range between 80-89% on 6 L.  There is been no improvement in patient's oxygen saturation over the past several days.    He continues to have allover body pain.  Patient rates pain as 7/10.    He denies headache, dizziness, urinary symptoms, nausea, vomiting, or diarrhea.  Objective:  Vital signs in last 24 hours:  Vitals:   10/01/22 1026 10/01/22 1230 10/01/22 1427 10/01/22 1508  BP: (!) 110/59  121/66   Pulse: 67  81   Resp: 16     Temp: 98.6 F (37 C)     TempSrc: Oral     SpO2: 93% 94% 92% 90%  Weight:      Height:        Intake/Output from previous day:   Intake/Output Summary (Last 24 hours) at 10/01/2022 1713 Last data filed at 10/01/2022 1237 Gross per 24 hour  Intake 391.91 ml  Output 1425 ml  Net -1033.09 ml    Physical Exam Constitutional:      Appearance: Normal appearance. He is ill-appearing.  HENT:     Mouth/Throat:     Pharynx: Oropharynx is clear.  Eyes:     Pupils: Pupils are equal, round, and reactive to light.  Cardiovascular:     Rate and Rhythm: Normal rate.     Pulses: Normal pulses.  Pulmonary:     Effort: Tachypnea present. No accessory muscle usage or respiratory distress.     Breath sounds: No decreased breath sounds, wheezing or rhonchi.  Abdominal:     General: Bowel sounds are normal. There is no distension.     Tenderness: There is no abdominal tenderness.  Neurological:     Mental Status: He is alert.      Lab Results:  Basic Metabolic Panel:    Component Value Date/Time   NA 136  09/30/2022 1451   NA 141 03/31/2022 1228   K 4.3 09/30/2022 1451   CL 102 09/30/2022 1451   CO2 29 09/30/2022 1451   BUN 15 09/30/2022 1451   BUN 10 03/31/2022 1228   CREATININE 0.97 09/30/2022 1451   GLUCOSE 90 09/30/2022 1451   CALCIUM 8.6 (L) 09/30/2022 1451   CBC:    Component Value Date/Time   WBC 10.8 (H) 09/30/2022 1451   HGB 8.4 (L) 09/30/2022 1451   HGB 9.5 (L) 03/31/2022 1228   HCT 24.2 (L) 09/30/2022 1451   HCT 26.1 (L) 03/31/2022 1228   PLT 763 (H) 09/30/2022 1451   PLT 318 03/31/2022 1228   MCV 96.4 09/30/2022 1451   MCV 107 (H) 03/31/2022 1228   NEUTROABS 4.4 09/25/2022 0311   NEUTROABS 3.5 03/31/2022 1228   LYMPHSABS 6.0 (H) 09/25/2022 0311   LYMPHSABS 6.1 (H) 03/31/2022 1228   MONOABS 1.6 (H) 09/25/2022 0311   EOSABS 0.4 09/25/2022 0311   EOSABS 0.1 03/31/2022 1228   BASOSABS 0.1 09/25/2022 0311   BASOSABS 0.1 03/31/2022 1228    No results found for this or any previous visit (from the past 240 hour(s)).  Studies/Results: No results found.  Medications: Scheduled Meds:  abacavir-dolutegravir-lamiVUDine  1 tablet Oral  QHS   ALPRAZolam  0.25 mg Oral QHS   apixaban  5 mg Oral BID   Chlorhexidine Gluconate Cloth  6 each Topical Daily   folic acid  1 mg Oral Daily   HYDROmorphone   Intravenous Q4H   hydroxyurea  2,000 mg Oral QHS   polyethylene glycol  17 g Oral Daily   Continuous Infusions:  ceFEPime (MAXIPIME) IV 2 g (10/01/22 1507)   dextrose 5 % and 0.45 % NaCl 20 mL/hr at 10/01/22 0337   PRN Meds:.acetaminophen, albuterol, diphenhydrAMINE, gabapentin, Glycerin (Adult), hydrocortisone cream, lip balm, mineral oil-hydrophilic petrolatum, naloxone **AND** sodium chloride flush, ondansetron, oxyCODONE-acetaminophen **AND** oxyCODONE  Consultants: none  Procedures: none  Antibiotics: Cefepime per pharmacy consult  Assessment/Plan: Principal Problem:   Sickle cell pain crisis (HCC) Active Problems:   Human immunodeficiency virus (HIV)  disease (HCC)   Chronic pain syndrome   Anemia of chronic disease   Hyperbilirubinemia  Acute chest syndrome/sickle cell pain crisis: Patient has an oxygen requirement of 6 L/min.   Patient is status post simple exchange transfusion on 09/26/2022.  Will monitor closely.  Labs in AM.  Will change IV antibiotics to cefepime today. Albuterol every 4 hours as needed Continue IV fluids to KVO Pain control with IV Dilaudid PCA no changes in settings today Tylenol 650 mg every 6 hours as needed Incentive spirometer, flutter valve Supplemental oxygen, wean as tolerated Monitor vital signs very closely, reevaluate pain scale regularly, and supplemental oxygen as needed  Chronic pain syndrome: Continue home medications  Anemia of chronic disease: Refer to above  History of PE: Continue Eliquis  Acute on chronic hypoxia with respiratory failure: Continue supplemental oxygen at 6 L/min, wean to home oxygen as tolerated  Code Status: Full Code Family Communication: N/A Disposition Plan: Not yet ready for discharge  [name]  APRN, [name], [name] [facility] [address] [place], [place] [address] [phone]  If 7PM-7AM, please contact night-coverage.  10/01/2022, 5:13 PM  LOS: 6 days

## 2022-10-01 NOTE — Progress Notes (Signed)
Subjective: Martin [name] is a 32 year old male with a medical history significant for sickle cell disease, chronic pain syndrome, opiate dependence and tolerance, history of PE on Eliquis, history of HIV, and history of anemia of chronic disease was admitted for acute chest syndrome in the setting of sickle cell pain crisis. Over the past several days, patient has required upwards of 6 L supplemental oxygen.  Ambulating pulse ox showed oxygen saturation ranging between 80-89%.  Review CD4 count, decreased at 31%. Patient states that he is starting to feel better.  Pain intensity has improved some to 6/10.  He states that he is resting comfortably today.   Objective:  Vital signs in last 24 hours:  Vitals:   10/01/22 1026 10/01/22 1230 10/01/22 1427 10/01/22 1508  BP: (!) 110/59  121/66   Pulse: 67  81   Resp: 16     Temp: 98.6 F (37 C)     TempSrc: Oral     SpO2: 93% 94% 92% 90%  Weight:      Height:        Intake/Output from previous day:   Intake/Output Summary (Last 24 hours) at 10/01/2022 1722 Last data filed at 10/01/2022 1237 Gross per 24 hour  Intake 391.91 ml  Output 1425 ml  Net -1033.09 ml    Physical Exam: General: Alert, awake, oriented x3, in no acute distress.  HEENT: [place]/AT PEERL, EOMI Neck: Trachea midline,  no masses, no thyromegal,y no JVD, no carotid bruit OROPHARYNX:  Moist, No exudate/ erythema/lesions.  Heart: Regular rate and rhythm, without murmurs, rubs, gallops, PMI non-displaced, no heaves or thrills on palpation.  Lungs: Clear to auscultation, no wheezing or rhonchi noted. No increased vocal fremitus resonant to percussion  Abdomen: Soft, nontender, nondistended, positive bowel sounds, no masses no hepatosplenomegaly noted..  Neuro: No focal neurological deficits noted cranial nerves II through XII grossly intact. DTRs 2+ bilaterally upper and lower extremities. Strength 5 out of 5 in bilateral upper and lower extremities. Musculoskeletal: No  warm swelling or erythema around joints, no spinal tenderness noted. Psychiatric: Patient alert and oriented x3, good insight and cognition, good recent to remote recall. Lymph node survey: No cervical axillary or inguinal lymphadenopathy noted.  Lab Results:  Basic Metabolic Panel:    Component Value Date/Time   NA 136 09/30/2022 1451   NA 141 03/31/2022 1228   K 4.3 09/30/2022 1451   CL 102 09/30/2022 1451   CO2 29 09/30/2022 1451   BUN 15 09/30/2022 1451   BUN 10 03/31/2022 1228   CREATININE 0.97 09/30/2022 1451   GLUCOSE 90 09/30/2022 1451   CALCIUM 8.6 (L) 09/30/2022 1451   CBC:    Component Value Date/Time   WBC 10.8 (H) 09/30/2022 1451   HGB 8.4 (L) 09/30/2022 1451   HGB 9.5 (L) 03/31/2022 1228   HCT 24.2 (L) 09/30/2022 1451   HCT 26.1 (L) 03/31/2022 1228   PLT 763 (H) 09/30/2022 1451   PLT 318 03/31/2022 1228   MCV 96.4 09/30/2022 1451   MCV 107 (H) 03/31/2022 1228   NEUTROABS 4.4 09/25/2022 0311   NEUTROABS 3.5 03/31/2022 1228   LYMPHSABS 6.0 (H) 09/25/2022 0311   LYMPHSABS 6.1 (H) 03/31/2022 1228   MONOABS 1.6 (H) 09/25/2022 0311   EOSABS 0.4 09/25/2022 0311   EOSABS 0.1 03/31/2022 1228   BASOSABS 0.1 09/25/2022 0311   BASOSABS 0.1 03/31/2022 1228    No results found for this or any previous visit (from the past 240 hour(s)).  Studies/Results: No results  found.  Medications: Scheduled Meds:  abacavir-dolutegravir-lamiVUDine  1 tablet Oral QHS   ALPRAZolam  0.25 mg Oral QHS   apixaban  5 mg Oral BID   Chlorhexidine Gluconate Cloth  6 each Topical Daily   folic acid  1 mg Oral Daily   HYDROmorphone   Intravenous Q4H   hydroxyurea  2,000 mg Oral QHS   polyethylene glycol  17 g Oral Daily   Continuous Infusions:  ceFEPime (MAXIPIME) IV 2 g (10/01/22 1507)   dextrose 5 % and 0.45 % NaCl 20 mL/hr at 10/01/22 0337   PRN Meds:.acetaminophen, albuterol, diphenhydrAMINE, gabapentin, Glycerin (Adult), hydrocortisone cream, lip balm, mineral  oil-hydrophilic petrolatum, naloxone **AND** sodium chloride flush, ondansetron, oxyCODONE-acetaminophen **AND** oxyCODONE  Consultants: pharmacy  Procedures: none  Antibiotics: IV cefepime  Assessment/Plan: Principal Problem:   Sickle cell pain crisis (HCC) Active Problems:   Human immunodeficiency virus (HIV) disease (HCC)   Chronic pain syndrome   Anemia of chronic disease   Hyperbilirubinemia  Acute chest syndrome/ sickle cell disease with pain crisis: Continue IV cefepime per pharmacy dosing Continue supplemental oxygen at 6 L.  Will wean to 2 L/min as tolerated.  Patient on oxygen at home. Will consider simple exchange transfusion in a.m. Albuterol inhaler every 4 hours as needed Continue IV Dilaudid PCA for pain control, no changes today Oxycodone 10 mg every 4 hours as needed  Anemia of chronic disease: Hemoglobin is stable and consistent with patient's baseline.  He is status post 2 units PRBCs. Continue to monitor closely.  Labs in AM.  History of HIV: CD4 percent decreased at 31.  According to pharmacy, patient has not been filling antivirals consistently.  Discussed the importance of taking medications consistently in order to achieve positive outcomes.  Patient expressed understanding.  He will need to follow-up with ID as an outpatient.  History of PE: Continue Eliquis  Chronic respiratory failure with hypoxia: Patient previously on home oxygen.  Supplemental oxygen at 6 L, will wean as tolerated.     Code Status: Full Code Family Communication: N/A Disposition Plan: Not yet ready for discharge  Bensen Chadderdon Rennis Petty  APRN, [name], [name] [facility] [address] [place], [place] [address] [phone]  If 7PM-7AM, please contact night-coverage.  10/01/2022, 5:22 PM  LOS: 6 days

## 2022-10-02 DIAGNOSIS — D5700 Hb-SS disease with crisis, unspecified: Secondary | ICD-10-CM | POA: Diagnosis not present

## 2022-10-02 LAB — COMPREHENSIVE METABOLIC PANEL
ALT: 13 U/L (ref 0–44)
AST: 27 U/L (ref 15–41)
Albumin: 3.6 g/dL (ref 3.5–5.0)
Alkaline Phosphatase: 54 U/L (ref 38–126)
Anion gap: 6 (ref 5–15)
BUN: 12 mg/dL (ref 6–20)
CO2: 27 mmol/L (ref 22–32)
Calcium: 8.6 mg/dL — ABNORMAL LOW (ref 8.9–10.3)
Chloride: 103 mmol/L (ref 98–111)
Creatinine, Ser: 0.78 mg/dL (ref 0.61–1.24)
GFR, Estimated: >60 mL/min (ref >60)
Glucose, Bld: 104 mg/dL — ABNORMAL HIGH (ref 70–99)
Potassium: 4.2 mmol/L (ref 3.5–5.1)
Sodium: 136 mmol/L (ref 135–145)
Total Bilirubin: 4.3 mg/dL — ABNORMAL HIGH (ref 0.3–1.2)
Total Protein: 8.1 g/dL (ref 6.5–8.1)

## 2022-10-02 LAB — CBC WITH DIFFERENTIAL/PLATELET
Abs Immature Granulocytes: 0.03 10*3/uL (ref 0.00–0.07)
Basophils Absolute: 0 10*3/uL (ref 0.0–0.1)
Basophils Relative: 1 %
Eosinophils Absolute: 0.2 10*3/uL (ref 0.0–0.5)
Eosinophils Relative: 2 %
HCT: 22.7 % — ABNORMAL LOW (ref 39.0–52.0)
Hemoglobin: 7.9 g/dL — ABNORMAL LOW (ref 13.0–17.0)
Immature Granulocytes: 0 %
Lymphocytes Relative: 36 %
Lymphs Abs: 3.2 10*3/uL (ref 0.7–4.0)
MCH: 34.1 pg — ABNORMAL HIGH (ref 26.0–34.0)
MCHC: 34.8 g/dL (ref 30.0–36.0)
MCV: 97.8 fL (ref 80.0–100.0)
Monocytes Absolute: 0.6 10*3/uL (ref 0.1–1.0)
Monocytes Relative: 7 %
Neutro Abs: 4.6 10*3/uL (ref 1.7–7.7)
Neutrophils Relative %: 54 %
Platelets: 808 10*3/uL — ABNORMAL HIGH (ref 150–400)
RBC: 2.32 MIL/uL — ABNORMAL LOW (ref 4.22–5.81)
RDW: 22.5 % — ABNORMAL HIGH (ref 11.5–15.5)
WBC: 8.2 10*3/uL (ref 4.0–10.5)
nRBC: 17 % — ABNORMAL HIGH (ref 0.0–0.2)

## 2022-10-02 LAB — BPAM RBC: Blood Product Expiration Date: 202407182359

## 2022-10-02 LAB — TYPE AND SCREEN: ABO/RH(D): O POS

## 2022-10-02 LAB — LACTATE DEHYDROGENASE: LDH: 444 U/L — ABNORMAL HIGH (ref 98–192)

## 2022-10-02 LAB — PREPARE RBC (CROSSMATCH)

## 2022-10-02 MED ORDER — ALPRAZOLAM 0.25 MG PO TABS
0.2500 mg | ORAL_TABLET | Freq: Every day | ORAL | Status: AC
  Administered 2022-10-02 – 2022-10-03 (×2): 0.25 mg via ORAL
  Filled 2022-10-02 (×2): qty 1

## 2022-10-02 MED ORDER — SODIUM CHLORIDE 0.9% IV SOLUTION: Freq: Once | INTRAVENOUS | Status: AC

## 2022-10-02 MED ORDER — DIPHENHYDRAMINE HCL 50 MG/ML IJ SOLN
25.0000 mg | Freq: Once | INTRAMUSCULAR | Status: AC
  Administered 2022-10-02: 25 mg via INTRAVENOUS
  Filled 2022-10-02: qty 1

## 2022-10-02 MED ORDER — ACETAMINOPHEN 325 MG PO TABS
650.0000 mg | ORAL_TABLET | Freq: Once | ORAL | Status: AC
  Administered 2022-10-02: 650 mg via ORAL
  Filled 2022-10-02: qty 2

## 2022-10-02 NOTE — Progress Notes (Signed)
Subjective: Martin [name] is a 32 year old male with a medical history significant for sickle cell disease, chronic pain syndrome, opiate dependence and tolerance, history of PE on Eliquis, history of HIV, and history of anemia of chronic disease was admitted for acute chest syndrome in the setting of sickle cell pain crisis. Patient does not have any new complaints on today.  Over the past several days, patient has required upwards of 4-6 L supplemental oxygen.  Ambulating pulse ox showed oxygen saturation ranging between 80-89%.  Review CD4 count, decreased at 31%.  Pain intensity has improved some to 6/10.  He states that he is resting comfortably today.   Objective:  Vital signs in last 24 hours:  Vitals:   10/02/22 1100 10/02/22 1136 10/02/22 1315 10/02/22 1734  BP:   110/71 103/69  Pulse:   76 87  Resp:  14 18 18   Temp:   97.9 F (36.6 C) 99.1 F (37.3 C)  TempSrc:   Oral Oral  SpO2: 93% 93% 92% 90%  Weight:      Height:        Intake/Output from previous day:   Intake/Output Summary (Last 24 hours) at 10/02/2022 1937 Last data filed at 10/02/2022 1735 Gross per 24 hour  Intake --  Output 225 ml  Net -225 ml    Physical Exam: General: Alert, awake, oriented x3, in no acute distress.  HEENT: [place]/AT PEERL, EOMI Neck: Trachea midline,  no masses, no thyromegal,y no JVD, no carotid bruit OROPHARYNX:  Moist, No exudate/ erythema/lesions.  Heart: Regular rate and rhythm, without murmurs, rubs, gallops, PMI non-displaced, no heaves or thrills on palpation.  Lungs: Clear to auscultation, no wheezing or rhonchi noted. No increased vocal fremitus resonant to percussion  Abdomen: Soft, nontender, nondistended, positive bowel sounds, no masses no hepatosplenomegaly noted..  Neuro: No focal neurological deficits noted cranial nerves II through XII grossly intact. DTRs 2+ bilaterally upper and lower extremities. Strength 5 out of 5 in bilateral upper and lower  extremities. Musculoskeletal: No warm swelling or erythema around joints, no spinal tenderness noted. Psychiatric: Patient alert and oriented x3, good insight and cognition, good recent to remote recall. Lymph node survey: No cervical axillary or inguinal lymphadenopathy noted.  Lab Results:  Basic Metabolic Panel:    Component Value Date/Time   NA 136 10/02/2022 0930   NA 141 03/31/2022 1228   K 4.2 10/02/2022 0930   CL 103 10/02/2022 0930   CO2 27 10/02/2022 0930   BUN 12 10/02/2022 0930   BUN 10 03/31/2022 1228   CREATININE 0.78 10/02/2022 0930   GLUCOSE 104 (H) 10/02/2022 0930   CALCIUM 8.6 (L) 10/02/2022 0930   CBC:    Component Value Date/Time   WBC 8.2 10/02/2022 0930   HGB 7.9 (L) 10/02/2022 0930   HGB 9.5 (L) 03/31/2022 1228   HCT 22.7 (L) 10/02/2022 0930   HCT 26.1 (L) 03/31/2022 1228   PLT 808 (H) 10/02/2022 0930   PLT 318 03/31/2022 1228   MCV 97.8 10/02/2022 0930   MCV 107 (H) 03/31/2022 1228   NEUTROABS 4.6 10/02/2022 0930   NEUTROABS 3.5 03/31/2022 1228   LYMPHSABS 3.2 10/02/2022 0930   LYMPHSABS 6.1 (H) 03/31/2022 1228   MONOABS 0.6 10/02/2022 0930   EOSABS 0.2 10/02/2022 0930   EOSABS 0.1 03/31/2022 1228   BASOSABS 0.0 10/02/2022 0930   BASOSABS 0.1 03/31/2022 1228    No results found for this or any previous visit (from the past 240 hour(s)).  Studies/Results: No results  found.  Medications: Scheduled Meds:  sodium chloride   Intravenous Once   abacavir-dolutegravir-lamiVUDine  1 tablet Oral QHS   acetaminophen  650 mg Oral Once   ALPRAZolam  0.25 mg Oral QHS   apixaban  5 mg Oral BID   Chlorhexidine Gluconate Cloth  6 each Topical Daily   diphenhydrAMINE  25 mg Intravenous Once   folic acid  1 mg Oral Daily   HYDROmorphone   Intravenous Q4H   hydroxyurea  2,000 mg Oral QHS   polyethylene glycol  17 g Oral Daily   Continuous Infusions:  ceFEPime (MAXIPIME) IV 2 g (10/02/22 1432)   dextrose 5 % and 0.45 % NaCl 20 mL/hr at 10/01/22 0337    PRN Meds:.acetaminophen, albuterol, diphenhydrAMINE, gabapentin, Glycerin (Adult), hydrocortisone cream, lip balm, mineral oil-hydrophilic petrolatum, naloxone **AND** sodium chloride flush, ondansetron, oxyCODONE-acetaminophen **AND** oxyCODONE  Consultants: pharmacy  Procedures: none  Antibiotics: IV cefepime  Assessment/Plan: Principal Problem:   Sickle cell pain crisis (HCC) Active Problems:   Human immunodeficiency virus (HIV) disease (HCC)   Chronic pain syndrome   Anemia of chronic disease   Hyperbilirubinemia  Acute chest syndrome/ sickle cell disease with pain crisis: Continue IV cefepime per pharmacy dosing Continue supplemental oxygen at 6 L.  Will wean to 2 L/min as tolerated.  Patient on oxygen at home. Will consider simple exchange transfusion in a.m. Albuterol inhaler every 4 hours as needed Continue IV Dilaudid PCA for pain control, no changes today Oxycodone 10 mg every 4 hours as needed  Anemia of chronic disease: Maintain hemoglobin above 8. Transfuse 1 unit PRBCs today.Marland Kitchen  He is status post 2 units PRBCs. Continue to monitor closely.  Labs in AM.  History of HIV: CD4 percent decreased at 31.  According to pharmacy, patient has not been filling antivirals consistently.  Discussed the importance of taking medications consistently in order to achieve positive outcomes.  Patient expressed understanding.  He will need to follow-up with ID as an outpatient.  History of PE: Continue Eliquis  Chronic respiratory failure with hypoxia: Patient previously on home oxygen.  Supplemental oxygen at 6 L, will wean as tolerated. Patient advised to have family bring home oxygen prior to discharge. He is a [place] resident.      Code Status: Full Code Family Communication: N/A Disposition Plan: Not yet ready for discharge  Martin Redd Rennis Petty  APRN, [name], [name] [facility] [address] [place], [place]  [address] [phone]  If 7PM-7AM, please contact night-coverage.  10/02/2022, 7:37 PM  LOS: 7 days

## 2022-10-03 DIAGNOSIS — D5700 Hb-SS disease with crisis, unspecified: Secondary | ICD-10-CM | POA: Diagnosis not present

## 2022-10-03 LAB — BPAM RBC
ISSUE DATE / TIME: 202406142158
Unit Type and Rh: 9500

## 2022-10-03 LAB — CBC
HCT: 23.7 % — ABNORMAL LOW (ref 39.0–52.0)
Hemoglobin: 8.3 g/dL — ABNORMAL LOW (ref 13.0–17.0)
MCH: 34.4 pg — ABNORMAL HIGH (ref 26.0–34.0)
MCHC: 35 g/dL (ref 30.0–36.0)
MCV: 98.3 fL (ref 80.0–100.0)
Platelets: 826 10*3/uL — ABNORMAL HIGH (ref 150–400)
RBC: 2.41 MIL/uL — ABNORMAL LOW (ref 4.22–5.81)
RDW: 21.2 % — ABNORMAL HIGH (ref 11.5–15.5)
WBC: 6.7 10*3/uL (ref 4.0–10.5)
nRBC: 20 % — ABNORMAL HIGH (ref 0.0–0.2)

## 2022-10-03 LAB — TYPE AND SCREEN
Donor AG Type: NEGATIVE
Unit division: 0

## 2022-10-03 NOTE — Progress Notes (Incomplete)
History and Physical  SICKLE CELL SERVICE PROGRESS NOTE  Martin [name] ZOX:[id number] DOB: 1990/12/25 DOA: 09/25/2022 PCP: Massie Maroon, [name]  Assessment/Plan: Principal Problem:   Sickle cell pain crisis (HCC) Active Problems:   Human immunodeficiency virus (HIV) disease (HCC)   Chronic pain syndrome   Anemia of chronic disease   Hyperbilirubinemia  Acute chest syndrome secondary to sickle cell crisis: Patient appears to be improving.  Oxygen requirement is going down currently down to 4 L of oxygen per minute.  On IV cefepime.  He has improved so no exchange transfusion today.  Continue inhalers. Sickle cell pain crisis: Patient is currently on Dilaudid PCA.  Also oxycodone 10 mg every 4 hours.  Overall clinically stable.  Continue to monitor. Anemia of chronic disease: Hemoglobin is 8.3 following his transfusion.  Continue to monitor HIV disease: CD4 count was low.  Patient however stable.  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  Continue Chronic respiratory failure with hypoxia: Continue supplemental oxygen.  Titrate.  Code Status: Full code Family Communication: Wife and daughter in the room Disposition Plan: Home when ready  [facility] [place]  Pager [phone]. If 7PM-7AM, please contact night-coverage.  10/04/2022, 11:28 PM  LOS: 9 days   Brief narrative: Martin [name]  is a 32 y.o. male with history significant for sickle cell disease, chronic pain syndrome, opiate dependence and tolerance, history of HIV, chronic respiratory failure on home oxygen, and anemia of chronic disease presents with complaints of allover body pain that is consistent with previous sickle cell crisis.  Patient was recently hospitalized with sickle cell pain crisis, pain intensity improved and patient was discharged home.  He states that 3 days prior pain intensity increased and he has not  identified any inciting factors.  He rates pain as 10/10, constant, and throbbing.  He endorses some shortness of breath and chest tightness.  He denies any headache, dizziness, fever, chills, or paresthesias.  No urinary symptoms, nausea, vomiting, or diarrhea.   Consultants: None  Procedures: Chest x-ray and blood transfusion  Antibiotics: IV cefepime  HPI/Subjective: Patient's pain is is 6 out of 10.  His oxygen requirement also has dropped.  He has done better.  Objective: Vitals:   10/03/22 1755 10/03/22 2019 10/03/22 2035 10/03/22 2038  BP:  99/62    Pulse:  75    Resp: 12 15 15 15   Temp:  98.3 F (36.8 C)    TempSrc:  Oral    SpO2: 90% (!) 84% (!) 89% (!) 89%  Weight:      Height:       Weight change:   Intake/Output Summary (Last 24 hours) at 10/03/2022 2328 Last data filed at 10/03/2022 1601 Gross per 24 hour  Intake 120 ml  Output 1350 ml  Net -1230 ml    General: Alert, awake, oriented x3, in no acute distress.  HEENT: [place]/AT PEERL, EOMI Neck: Trachea midline,  no masses, no thyromegal,y no JVD, no carotid bruit OROPHARYNX:  Moist, No exudate/ erythema/lesions.  Heart: Regular rate and rhythm, without murmurs, rubs, gallops, PMI non-displaced, no heaves or thrills on palpation.  Lungs: Clear to auscultation, no wheezing or rhonchi noted. No increased vocal fremitus resonant to percussion  Abdomen: Soft, nontender, nondistended, positive bowel sounds, no masses no hepatosplenomegaly noted..  Neuro: No focal neurological deficits noted cranial nerves II through XII grossly intact. DTRs 2+ bilaterally upper and lower extremities. Strength 5  out of 5 in bilateral upper and lower extremities. Musculoskeletal: No warm swelling or erythema around joints, no spinal tenderness noted. Psychiatric: Patient alert and oriented x3, good insight and cognition, good recent to remote recall. Lymph node survey: No cervical axillary or inguinal lymphadenopathy noted.   Data  Reviewed: Basic Metabolic Panel: Recent Labs  Lab 09/30/22 1451 10/02/22 0930  NA 136 136  K 4.3 4.2  CL 102 103  CO2 29 27  GLUCOSE 90 104*  BUN 15 12  CREATININE 0.97 0.78  CALCIUM 8.6* 8.6*   Liver Function Tests: Recent Labs  Lab 09/29/22 0500 09/30/22 1451 10/02/22 0930  AST 28 29 27   ALT 14 14 13   ALKPHOS 56 58 54  BILITOT 3.8* 3.3* 4.3*  PROT 8.2* 8.7* 8.1  ALBUMIN 3.6 3.4* 3.6   No results for input(s): "LIPASE", "AMYLASE" in the last 168 hours. No results for input(s): "AMMONIA" in the last 168 hours. CBC: Recent Labs  Lab 09/28/22 0500 09/29/22 0500 09/30/22 1451 10/02/22 0930 10/03/22 0434  WBC 11.1* 10.4 10.8* 8.2 6.7  NEUTROABS  --   --   --  4.6  --   HGB 8.4* 8.3* 8.4* 7.9* 8.3*  HCT 23.6* 23.5* 24.2* 22.7* 23.7*  MCV 96.7 96.7 96.4 97.8 98.3  PLT 500* 568* 763* 808* 826*   Cardiac Enzymes: No results for input(s): "CKTOTAL", "CKMB", "CKMBINDEX", "TROPONINI" in the last 168 hours. BNP (last 3 results) No results for input(s): "BNP" in the last 8760 hours.  ProBNP (last 3 results) No results for input(s): "PROBNP" in the last 8760 hours.  CBG: No results for input(s): "GLUCAP" in the last 168 hours.  No results found for this or any previous visit (from the past 240 hour(s)).   Studies: CT CHEST WO CONTRAST  Result Date: 09/25/2022 CLINICAL DATA:  Dyspnea, concern for sickle cell crisis EXAM: CT CHEST WITHOUT CONTRAST TECHNIQUE: Multidetector CT imaging of the chest was performed following the standard protocol without IV contrast. RADIATION DOSE REDUCTION: This exam was performed according to the departmental dose-optimization program which includes automated exposure control, adjustment of the mA and/or kV according to patient size and/or use of iterative reconstruction technique. COMPARISON:  Chest CT dated Sep 15, 2022 FINDINGS: Cardiovascular: Mild cardiomegaly. No pericardial effusion. Normal caliber thoracic aorta no significant  atherosclerotic disease. No coronary artery calcifications. Right chest wall port with tip in the SVC. Mediastinum/Nodes: Esophagus and thyroid are unremarkable. No enlarged lymph nodes seen in the chest. Lungs/Pleura: Central airways are patent. Bilateral mosaic attenuation. Redemonstrated patchy peribronchovascular predominant ground-glass opacities, new areas of ground-glass attenuation are seen in the upper lobes. Lower lung predominant scattered linear opacities, likely due to scarring. No pleural effusion or pneumothorax. Upper Abdomen: No acute abnormality. Musculoskeletal: No chest wall mass or suspicious bone lesions identified. IMPRESSION: 1. Redemonstrated patchy peribronchovascular predominant ground-glass opacities, new areas of ground-glass attenuation are seen in the upper lobes. Findings are nonspecific but can be seen in the setting of acute chest syndrome. 2. Bilateral mosaic attenuation, likely due to mosaic perfusion. 3. Unchanged bilateral lower lung predominant scarring. Electronically Signed   By: Allegra Lai M.D.   On: 09/25/2022 15:14   DG Chest 2 View  Result Date: 09/25/2022 CLINICAL DATA:  Sickle cell crisis and pain, initial encounter EXAM: CHEST - 2 VIEW COMPARISON:  09/14/2022 FINDINGS: Cardiac shadow is stable. Right chest wall port is again identified with a focal loop near the venous entry site. Diffuse interstitial changes are noted in the  lungs bilaterally. No sizable effusion is seen. No bony abnormality is noted. IMPRESSION: Mild interstitial changes likely representing edema. Electronically Signed   By: Alcide Clever M.D.   On: 09/25/2022 03:17   ECHOCARDIOGRAM COMPLETE  Result Date: 09/15/2022    ECHOCARDIOGRAM REPORT   Patient Name:   DAMARRION MCRORIE Date of Exam: 09/15/2022 Medical Rec #:  [id number]           Height:       75.0 in Accession #:    [id number]          Weight:       148.6 lb Date of Birth:  October 19, 1990           BSA:          1.934 m Patient Age:     31 years            BP:           116/76 mmHg Patient Gender: M                   HR:           68 bpm. Exam Location:  Inpatient Procedure: 2D Echo, Cardiac Doppler and Color Doppler Indications:    CHF - Acute Systolic  History:        Patient has prior history of Echocardiogram examinations, most                 recent 07/14/2021. Sickle cell, Signs/Symptoms:respiratory                 failure; Risk Factors:HIV+, hx of PE.  Sonographer:    Wallie Char Referring Phys: [id number] VHQIONGEX RATHORE  Sonographer Comments: Global longitudinal strain was attempted. IMPRESSIONS  1. Left ventricular ejection fraction, by estimation, is 55 to 60%. The left ventricle has normal function. The left ventricle has no regional wall motion abnormalities. Left ventricular diastolic parameters were normal. The average left ventricular global longitudinal strain is -22.0 %. The global longitudinal strain is normal.  2. Right ventricular systolic function is normal. The right ventricular size is normal. There is mildly elevated pulmonary artery systolic pressure. The estimated right ventricular systolic pressure is 43.0 mmHg.  3. Left atrial size was mildly dilated.  4. The mitral valve is grossly normal. Mild mitral valve regurgitation. No evidence of mitral stenosis.  5. The aortic valve is tricuspid. Aortic valve regurgitation is not visualized. No aortic stenosis is present.  6. The inferior vena cava is dilated in size with >50% respiratory variability, suggesting right atrial pressure of 8 mmHg. FINDINGS  Left Ventricle: Left ventricular ejection fraction, by estimation, is 55 to 60%. The left ventricle has normal function. The left ventricle has no regional wall motion abnormalities. The average left ventricular global longitudinal strain is -22.0 %. The global longitudinal strain is normal. The left ventricular internal cavity size was normal in size. There is no left ventricular hypertrophy. Left ventricular diastolic  parameters were normal. Right Ventricle: The right ventricular size is normal. No increase in right ventricular wall thickness. Right ventricular systolic function is normal. There is mildly elevated pulmonary artery systolic pressure. The tricuspid regurgitant velocity is 2.96  m/s, and with an assumed right atrial pressure of 8 mmHg, the estimated right ventricular systolic pressure is 43.0 mmHg. Left Atrium: Left atrial size was mildly dilated. Right Atrium: Right atrial size was normal in size. Pericardium: Trivial pericardial effusion is present. Mitral Valve: The mitral valve is grossly normal.  Mild mitral valve regurgitation. No evidence of mitral valve stenosis. MV peak gradient, 4.7 mmHg. The mean mitral valve gradient is 3.0 mmHg. Tricuspid Valve: The tricuspid valve is grossly normal. Tricuspid valve regurgitation is mild . No evidence of tricuspid stenosis. Aortic Valve: The aortic valve is tricuspid. Aortic valve regurgitation is not visualized. No aortic stenosis is present. Aortic valve mean gradient measures 4.0 mmHg. Aortic valve peak gradient measures 6.8 mmHg. Aortic valve area, by VTI measures 4.00 cm. Pulmonic Valve: The pulmonic valve was grossly normal. Pulmonic valve regurgitation is trivial. No evidence of pulmonic stenosis. Aorta: The aortic root and ascending aorta are structurally normal, with no evidence of dilitation. Venous: The inferior vena cava is dilated in size with greater than 50% respiratory variability, suggesting right atrial pressure of 8 mmHg. IAS/Shunts: The atrial septum is grossly normal.  LEFT VENTRICLE PLAX 2D LVIDd:         5.40 cm      Diastology LVIDs:         3.70 cm      LV e' medial:    10.60 cm/s LV PW:         1.00 cm      LV E/e' medial:  11.5 LV IVS:        0.90 cm      LV e' lateral:   12.80 cm/s LVOT diam:     2.30 cm      LV E/e' lateral: 9.5 LV SV:         120 LV SV Index:   62           2D Longitudinal Strain LVOT Area:     4.15 cm     2D Strain GLS  Avg:     -22.0 %  LV Volumes (MOD) LV vol d, MOD A2C: 154.0 ml LV vol d, MOD A4C: 146.0 ml LV vol s, MOD A2C: 66.3 ml LV vol s, MOD A4C: 60.1 ml LV SV MOD A2C:     87.7 ml LV SV MOD A4C:     146.0 ml LV SV MOD BP:      88.7 ml RIGHT VENTRICLE RV Basal diam:  3.80 cm RV S prime:     15.00 cm/s TAPSE (M-mode): 2.4 cm LEFT ATRIUM             Index        RIGHT ATRIUM           Index LA diam:        4.60 cm 2.38 cm/m   RA Area:     19.80 cm LA Vol (A2C):   70.5 ml 36.45 ml/m  RA Volume:   57.40 ml  29.68 ml/m LA Vol (A4C):   78.3 ml 40.48 ml/m LA Biplane Vol: 75.9 ml 39.24 ml/m  AORTIC VALVE AV Area (Vmax):    4.03 cm AV Area (Vmean):   3.92 cm AV Area (VTI):     4.00 cm AV Vmax:           130.00 cm/s AV Vmean:          95.650 cm/s AV VTI:            0.300 m AV Peak Grad:      6.8 mmHg AV Mean Grad:      4.0 mmHg LVOT Vmax:         126.00 cm/s LVOT Vmean:        90.150 cm/s LVOT VTI:  0.289 m LVOT/AV VTI ratio: 0.96  AORTA Ao Root diam: 3.90 cm Ao Asc diam:  3.70 cm MITRAL VALVE                TRICUSPID VALVE MV Area (PHT): 3.24 cm     TR Peak grad:   35.0 mmHg MV Area VTI:   3.81 cm     TR Vmax:        296.00 cm/s MV Peak grad:  4.7 mmHg MV Mean grad:  3.0 mmHg     SHUNTS MV Vmax:       1.08 m/s     Systemic VTI:  0.29 m MV Vmean:      77.0 cm/s    Systemic Diam: 2.30 cm MV Decel Time: 234 msec MV E velocity: 122.00 cm/s MV A velocity: 105.00 cm/s MV E/A ratio:  1.16 Lennie Odor MD Electronically signed by Lennie Odor MD Signature Date/Time: 09/15/2022/11:36:52 AM    Final    CT Angio Chest PE W and/or Wo Contrast  Result Date: 09/15/2022 CLINICAL DATA:  History of sickle cell anemia and HIV presenting with back pain x1 day. EXAM: CT ANGIOGRAPHY CHEST WITH CONTRAST TECHNIQUE: Multidetector CT imaging of the chest was performed using the standard protocol during bolus administration of intravenous contrast. Multiplanar CT image reconstructions and MIPs were obtained to evaluate the vascular  anatomy. RADIATION DOSE REDUCTION: This exam was performed according to the departmental dose-optimization program which includes automated exposure control, adjustment of the mA and/or kV according to patient size and/or use of iterative reconstruction technique. CONTRAST:  75mL OMNIPAQUE IOHEXOL 350 MG/ML SOLN COMPARISON:  August 03, 2021 FINDINGS: Cardiovascular: There is stable right-sided venous Port-A-Cath positioning. The thoracic aorta is normal in appearance. Satisfactory opacification of the pulmonary arteries to the segmental level. No evidence of pulmonary embolism. Normal heart size. No pericardial effusion. Mediastinum/Nodes: Stable right hilar lymphadenopathy is seen. Thyroid gland, trachea, and esophagus demonstrate no significant findings. Lungs/Pleura: Stable, mild to moderate severity patchy areas of ground-glass appearing parenchyma are seen scattered throughout both lungs. Stable areas of focal scarring and/or atelectasis are seen within the bilateral upper lobes. Moderate severity the lingular and bilateral lower lobe scarring and/or atelectasis is also seen. No pleural effusion or pneumothorax is identified. Upper Abdomen: Surgical clips are seen within the gallbladder fossa. A small, densely calcified spleen is noted. Musculoskeletal: No chest wall abnormality. No acute or significant osseous findings. Review of the MIP images confirms the above findings. IMPRESSION: 1. No evidence of pulmonary embolism. 2. Stable, mild to moderate severity patchy areas of ground-glass appearing parenchyma scattered throughout both lungs which may represent sequelae associated with an atypical infection. 3. Stable areas of focal scarring and/or atelectasis within the bilateral upper lobes. 4. Moderate severity lingular and bilateral lower lobe scarring and/or atelectasis. 5. Stable right hilar lymphadenopathy. 6. Evidence of prior cholecystectomy. Electronically Signed   By: Aram Candela M.D.   On:  09/15/2022 01:11   DG Chest 2 View  Result Date: 09/14/2022 CLINICAL DATA:  Sickle cell crisis. EXAM: CHEST - 2 VIEW COMPARISON:  June 28, 2022 FINDINGS: There is stable right-sided venous Port-A-Cath positioning. The heart size and mediastinal contours are within normal limits. Mild to moderate severity increased interstitial lung markings are seen with mild prominence of the bilateral perihilar pulmonary vasculature. There is no evidence of a pleural effusion or pneumothorax. No acute osseous abnormalities are identified. IMPRESSION: Findings consistent with mild to moderate severity pulmonary vascular congestion. Electronically Signed  By: Aram Candela M.D.   On: 09/14/2022 21:44    Scheduled Meds:  abacavir-dolutegravir-lamiVUDine  1 tablet Oral QHS   apixaban  5 mg Oral BID   Chlorhexidine Gluconate Cloth  6 each Topical Daily   folic acid  1 mg Oral Daily   HYDROmorphone   Intravenous Q4H   hydroxyurea  2,000 mg Oral QHS   polyethylene glycol  17 g Oral Daily   sodium chloride flush  10-40 mL Intracatheter Q12H   Continuous Infusions:  ceFEPime (MAXIPIME) IV 2 g (10/04/22 2044)   dextrose 5 % and 0.45 % NaCl 20 mL/hr at 10/04/22 0204    Principal Problem:   Sickle cell pain crisis [facility]) Active Problems:   Human immunodeficiency virus (HIV) disease (HCC)   Chronic pain syndrome   Anemia of chronic disease   Hyperbilirubinemia     10/03/2022, 8:14 AM

## 2022-10-04 DIAGNOSIS — D5700 Hb-SS disease with crisis, unspecified: Secondary | ICD-10-CM | POA: Diagnosis not present

## 2022-10-04 MED ORDER — SODIUM CHLORIDE 0.9% FLUSH
10.0000 mL | Freq: Two times a day (BID) | INTRAVENOUS | Status: DC
  Administered 2022-10-04 – 2022-10-06 (×5): 10 mL

## 2022-10-04 MED ORDER — SODIUM CHLORIDE 0.9% FLUSH: 10.0000 mL | INTRAVENOUS | Status: DC | PRN

## 2022-10-04 NOTE — Progress Notes (Signed)
History and Physical  SICKLE CELL SERVICE PROGRESS NOTE  [name] YQM:[id number] DOB: [birth year] DOA: 09/25/2022 PCP: Massie Maroon, [name]  Assessment/Plan: Principal Problem:   Sickle cell pain crisis (HCC) Active Problems:   Human immunodeficiency virus (HIV) disease (HCC)   Chronic pain syndrome   Anemia of chronic disease   Hyperbilirubinemia  Acute chest syndrome secondary to sickle cell crisis: Patient appears to be improving.  Oxygen requirement is going down currently down to 4 L of oxygen per minute.  On IV cefepime.  He has improved so no exchange transfusion today.  Continue inhalers.  Patient to mobilize and move around as much as possible. Sickle cell pain crisis: Patient is currently on Dilaudid PCA.  Also oxycodone 10 mg every 4 hours.  Overall clinically stable.  Continue to monitor. Anemia of chronic disease: Hemoglobin is 8.3 following his transfusion.  Will recheck hemoglobin tomorrow.  Continue to monitor HIV disease: CD4 count was low at 31%.  Patient has not been compliant.  Needs to follow-up with infectious disease..  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  Continue Chronic respiratory failure with hypoxia: Continue supplemental oxygen.  Titrate.  Code Status: Full code Family Communication: No family at bedside  disposition Plan: Home when ready  [facility]  Pager [phone]. If 7PM-7AM, please contact night-coverage.  10/04/2022, 11:33 PM  LOS: 9 days   Brief narrative: Martin [name]  is a 32 y.o. male with history significant for sickle cell disease, chronic pain syndrome, opiate dependence and tolerance, history of HIV, chronic respiratory failure on home oxygen, and anemia of chronic disease presents with complaints of allover body pain that is consistent with previous sickle cell crisis.  Patient was recently hospitalized with sickle  cell pain crisis, pain intensity improved and patient was discharged home.  He states that 3 days prior pain intensity increased and he has not identified any inciting factors.  He rates pain as 10/10, constant, and throbbing.  He endorses some shortness of breath and chest tightness.  He denies any headache, dizziness, fever, chills, or paresthesias.  No urinary symptoms, nausea, vomiting, or diarrhea.   Consultants: None  Procedures: Chest x-ray and blood transfusion  Antibiotics: IV cefepime  HPI/Subjective: Patient's pain is is improved.  Breathing also improved.  He is currently rating his pain as 6 out of 10.  Oxygen is still at 5 to 6 L/min. Objective: Vitals:   10/04/22 1755 10/04/22 2019 10/04/22 2035 10/04/22 2038  BP:  99/62    Pulse:  75    Resp: 12 15 15 15   Temp:  98.3 F (36.8 C)    TempSrc:  Oral    SpO2: 90% (!) 84% (!) 89% (!) 89%  Weight:      Height:       Weight change:   Intake/Output Summary (Last 24 hours) at 10/04/2022 2333 Last data filed at 10/04/2022 1601 Gross per 24 hour  Intake 120 ml  Output 1350 ml  Net -1230 ml     General: Alert, awake, oriented x3, in no acute distress.  HEENT: [place]/AT PEERL, EOMI Neck: Trachea midline,  no masses, no thyromegal,y no JVD, no carotid bruit OROPHARYNX:  Moist, No exudate/ erythema/lesions.  Heart: Regular rate and rhythm, without murmurs, rubs, gallops, PMI non-displaced, no heaves or thrills on palpation.  Lungs: Clear to auscultation, no wheezing or rhonchi noted. No increased vocal fremitus resonant to percussion  Abdomen: Soft, nontender, nondistended, positive bowel sounds, no masses no hepatosplenomegaly noted..  Neuro: No focal neurological deficits noted cranial nerves II through XII grossly intact. DTRs 2+ bilaterally upper and lower extremities. Strength 5 out of 5 in bilateral upper and lower extremities. Musculoskeletal: No warm swelling or erythema around joints, no spinal tenderness  noted. Psychiatric: Patient alert and oriented x3, good insight and cognition, good recent to remote recall. Lymph node survey: No cervical axillary or inguinal lymphadenopathy noted.   Data Reviewed: Basic Metabolic Panel: Recent Labs  Lab 09/30/22 1451 10/02/22 0930  NA 136 136  K 4.3 4.2  CL 102 103  CO2 29 27  GLUCOSE 90 104*  BUN 15 12  CREATININE 0.97 0.78  CALCIUM 8.6* 8.6*    Liver Function Tests: Recent Labs  Lab 09/29/22 0500 09/30/22 1451 10/02/22 0930  AST 28 29 27   ALT 14 14 13   ALKPHOS 56 58 54  BILITOT 3.8* 3.3* 4.3*  PROT 8.2* 8.7* 8.1  ALBUMIN 3.6 3.4* 3.6    No results for input(s): "LIPASE", "AMYLASE" in the last 168 hours. No results for input(s): "AMMONIA" in the last 168 hours. CBC: Recent Labs  Lab 09/28/22 0500 09/29/22 0500 09/30/22 1451 10/02/22 0930 10/03/22 0434  WBC 11.1* 10.4 10.8* 8.2 6.7  NEUTROABS  --   --   --  4.6  --   HGB 8.4* 8.3* 8.4* 7.9* 8.3*  HCT 23.6* 23.5* 24.2* 22.7* 23.7*  MCV 96.7 96.7 96.4 97.8 98.3  PLT 500* 568* 763* 808* 826*    Cardiac Enzymes: No results for input(s): "CKTOTAL", "CKMB", "CKMBINDEX", "TROPONINI" in the last 168 hours. BNP (last 3 results) No results for input(s): "BNP" in the last 8760 hours.  ProBNP (last 3 results) No results for input(s): "PROBNP" in the last 8760 hours.  CBG: No results for input(s): "GLUCAP" in the last 168 hours.  No results found for this or any previous visit (from the past 240 hour(s)).   Studies: CT CHEST WO CONTRAST  Result Date: 09/25/2022 CLINICAL DATA:  Dyspnea, concern for sickle cell crisis EXAM: CT CHEST WITHOUT CONTRAST TECHNIQUE: Multidetector CT imaging of the chest was performed following the standard protocol without IV contrast. RADIATION DOSE REDUCTION: This exam was performed according to the departmental dose-optimization program which includes automated exposure control, adjustment of the mA and/or kV according to patient size and/or use  of iterative reconstruction technique. COMPARISON:  Chest CT dated Sep 15, 2022 FINDINGS: Cardiovascular: Mild cardiomegaly. No pericardial effusion. Normal caliber thoracic aorta no significant atherosclerotic disease. No coronary artery calcifications. Right chest wall port with tip in the SVC. Mediastinum/Nodes: Esophagus and thyroid are unremarkable. No enlarged lymph nodes seen in the chest. Lungs/Pleura: Central airways are patent. Bilateral mosaic attenuation. Redemonstrated patchy peribronchovascular predominant ground-glass opacities, new areas of ground-glass attenuation are seen in the upper lobes. Lower lung predominant scattered linear opacities, likely due to scarring. No pleural effusion or pneumothorax. Upper Abdomen: No acute abnormality. Musculoskeletal: No chest wall mass or suspicious bone lesions identified. IMPRESSION: 1. Redemonstrated patchy peribronchovascular predominant ground-glass opacities, new areas of ground-glass attenuation are seen in the upper lobes. Findings are nonspecific but can be seen in the setting of acute chest syndrome. 2. Bilateral mosaic attenuation, likely due to mosaic perfusion. 3. Unchanged bilateral lower lung predominant scarring. Electronically Signed   By: Allegra Lai M.D.   On: 09/25/2022 15:14   DG Chest 2 View  Result Date: 09/25/2022 CLINICAL DATA:  Sickle cell crisis and pain,  initial encounter EXAM: CHEST - 2 VIEW COMPARISON:  09/14/2022 FINDINGS: Cardiac shadow is stable. Right chest wall port is again identified with a focal loop near the venous entry site. Diffuse interstitial changes are noted in the lungs bilaterally. No sizable effusion is seen. No bony abnormality is noted. IMPRESSION: Mild interstitial changes likely representing edema. Electronically Signed   By: Alcide Clever M.D.   On: 09/25/2022 03:17   ECHOCARDIOGRAM COMPLETE  Result Date: 09/15/2022    ECHOCARDIOGRAM REPORT   Patient Name:   ZHYAIRE HUBBS Date of Exam:  09/15/2022 Medical Rec #:  [id number]           Height:       75.0 in Accession #:    [id number]          Weight:       148.6 lb Date of Birth:  05/24/1990           BSA:          1.934 m Patient Age:    31 years            BP:           116/76 mmHg Patient Gender: M                   HR:           68 bpm. Exam Location:  Inpatient Procedure: 2D Echo, Cardiac Doppler and Color Doppler Indications:    CHF - Acute Systolic  History:        Patient has prior history of Echocardiogram examinations, most                 recent 07/14/2021. Sickle cell, Signs/Symptoms:respiratory                 failure; Risk Factors:HIV+, hx of PE.  Sonographer:    Wallie Char Referring Phys: [id number] HYQMVHQIO RATHORE  Sonographer Comments: Global longitudinal strain was attempted. IMPRESSIONS  1. Left ventricular ejection fraction, by estimation, is 55 to 60%. The left ventricle has normal function. The left ventricle has no regional wall motion abnormalities. Left ventricular diastolic parameters were normal. The average left ventricular global longitudinal strain is -22.0 %. The global longitudinal strain is normal.  2. Right ventricular systolic function is normal. The right ventricular size is normal. There is mildly elevated pulmonary artery systolic pressure. The estimated right ventricular systolic pressure is 43.0 mmHg.  3. Left atrial size was mildly dilated.  4. The mitral valve is grossly normal. Mild mitral valve regurgitation. No evidence of mitral stenosis.  5. The aortic valve is tricuspid. Aortic valve regurgitation is not visualized. No aortic stenosis is present.  6. The inferior vena cava is dilated in size with >50% respiratory variability, suggesting right atrial pressure of 8 mmHg. FINDINGS  Left Ventricle: Left ventricular ejection fraction, by estimation, is 55 to 60%. The left ventricle has normal function. The left ventricle has no regional wall motion abnormalities. The average left ventricular global  longitudinal strain is -22.0 %. The global longitudinal strain is normal. The left ventricular internal cavity size was normal in size. There is no left ventricular hypertrophy. Left ventricular diastolic parameters were normal. Right Ventricle: The right ventricular size is normal. No increase in right ventricular wall thickness. Right ventricular systolic function is normal. There is mildly elevated pulmonary artery systolic pressure. The tricuspid regurgitant velocity is 2.96  m/s, and with an assumed right atrial pressure of 8 mmHg, the estimated  right ventricular systolic pressure is 43.0 mmHg. Left Atrium: Left atrial size was mildly dilated. Right Atrium: Right atrial size was normal in size. Pericardium: Trivial pericardial effusion is present. Mitral Valve: The mitral valve is grossly normal. Mild mitral valve regurgitation. No evidence of mitral valve stenosis. MV peak gradient, 4.7 mmHg. The mean mitral valve gradient is 3.0 mmHg. Tricuspid Valve: The tricuspid valve is grossly normal. Tricuspid valve regurgitation is mild . No evidence of tricuspid stenosis. Aortic Valve: The aortic valve is tricuspid. Aortic valve regurgitation is not visualized. No aortic stenosis is present. Aortic valve mean gradient measures 4.0 mmHg. Aortic valve peak gradient measures 6.8 mmHg. Aortic valve area, by VTI measures 4.00 cm. Pulmonic Valve: The pulmonic valve was grossly normal. Pulmonic valve regurgitation is trivial. No evidence of pulmonic stenosis. Aorta: The aortic root and ascending aorta are structurally normal, with no evidence of dilitation. Venous: The inferior vena cava is dilated in size with greater than 50% respiratory variability, suggesting right atrial pressure of 8 mmHg. IAS/Shunts: The atrial septum is grossly normal.  LEFT VENTRICLE PLAX 2D LVIDd:         5.40 cm      Diastology LVIDs:         3.70 cm      LV e' medial:    10.60 cm/s LV PW:         1.00 cm      LV E/e' medial:  11.5 LV IVS:         0.90 cm      LV e' lateral:   12.80 cm/s LVOT diam:     2.30 cm      LV E/e' lateral: 9.5 LV SV:         120 LV SV Index:   62           2D Longitudinal Strain LVOT Area:     4.15 cm     2D Strain GLS Avg:     -22.0 %  LV Volumes (MOD) LV vol d, MOD A2C: 154.0 ml LV vol d, MOD A4C: 146.0 ml LV vol s, MOD A2C: 66.3 ml LV vol s, MOD A4C: 60.1 ml LV SV MOD A2C:     87.7 ml LV SV MOD A4C:     146.0 ml LV SV MOD BP:      88.7 ml RIGHT VENTRICLE RV Basal diam:  3.80 cm RV S prime:     15.00 cm/s TAPSE (M-mode): 2.4 cm LEFT ATRIUM             Index        RIGHT ATRIUM           Index LA diam:        4.60 cm 2.38 cm/m   RA Area:     19.80 cm LA Vol (A2C):   70.5 ml 36.45 ml/m  RA Volume:   57.40 ml  29.68 ml/m LA Vol (A4C):   78.3 ml 40.48 ml/m LA Biplane Vol: 75.9 ml 39.24 ml/m  AORTIC VALVE AV Area (Vmax):    4.03 cm AV Area (Vmean):   3.92 cm AV Area (VTI):     4.00 cm AV Vmax:           130.00 cm/s AV Vmean:          95.650 cm/s AV VTI:            0.300 m AV Peak Grad:      6.8 mmHg AV  Mean Grad:      4.0 mmHg LVOT Vmax:         126.00 cm/s LVOT Vmean:        90.150 cm/s LVOT VTI:          0.289 m LVOT/AV VTI ratio: 0.96  AORTA Ao Root diam: 3.90 cm Ao Asc diam:  3.70 cm MITRAL VALVE                TRICUSPID VALVE MV Area (PHT): 3.24 cm     TR Peak grad:   35.0 mmHg MV Area VTI:   3.81 cm     TR Vmax:        296.00 cm/s MV Peak grad:  4.7 mmHg MV Mean grad:  3.0 mmHg     SHUNTS MV Vmax:       1.08 m/s     Systemic VTI:  0.29 m MV Vmean:      77.0 cm/s    Systemic Diam: 2.30 cm MV Decel Time: 234 msec MV E velocity: 122.00 cm/s MV A velocity: 105.00 cm/s MV E/A ratio:  1.16 Lennie Odor MD Electronically signed by Lennie Odor MD Signature Date/Time: 09/15/2022/11:36:52 AM    Final    CT Angio Chest PE W and/or Wo Contrast  Result Date: 09/15/2022 CLINICAL DATA:  History of sickle cell anemia and HIV presenting with back pain x1 day. EXAM: CT ANGIOGRAPHY CHEST WITH CONTRAST TECHNIQUE: Multidetector CT  imaging of the chest was performed using the standard protocol during bolus administration of intravenous contrast. Multiplanar CT image reconstructions and MIPs were obtained to evaluate the vascular anatomy. RADIATION DOSE REDUCTION: This exam was performed according to the departmental dose-optimization program which includes automated exposure control, adjustment of the mA and/or kV according to patient size and/or use of iterative reconstruction technique. CONTRAST:  75mL OMNIPAQUE IOHEXOL 350 MG/ML SOLN COMPARISON:  August 03, 2021 FINDINGS: Cardiovascular: There is stable right-sided venous Port-A-Cath positioning. The thoracic aorta is normal in appearance. Satisfactory opacification of the pulmonary arteries to the segmental level. No evidence of pulmonary embolism. Normal heart size. No pericardial effusion. Mediastinum/Nodes: Stable right hilar lymphadenopathy is seen. Thyroid gland, trachea, and esophagus demonstrate no significant findings. Lungs/Pleura: Stable, mild to moderate severity patchy areas of ground-glass appearing parenchyma are seen scattered throughout both lungs. Stable areas of focal scarring and/or atelectasis are seen within the bilateral upper lobes. Moderate severity the lingular and bilateral lower lobe scarring and/or atelectasis is also seen. No pleural effusion or pneumothorax is identified. Upper Abdomen: Surgical clips are seen within the gallbladder fossa. A small, densely calcified spleen is noted. Musculoskeletal: No chest wall abnormality. No acute or significant osseous findings. Review of the MIP images confirms the above findings. IMPRESSION: 1. No evidence of pulmonary embolism. 2. Stable, mild to moderate severity patchy areas of ground-glass appearing parenchyma scattered throughout both lungs which may represent sequelae associated with an atypical infection. 3. Stable areas of focal scarring and/or atelectasis within the bilateral upper lobes. 4. Moderate severity  lingular and bilateral lower lobe scarring and/or atelectasis. 5. Stable right hilar lymphadenopathy. 6. Evidence of prior cholecystectomy. Electronically Signed   By: Aram Candela M.D.   On: 09/15/2022 01:11   DG Chest 2 View  Result Date: 09/14/2022 CLINICAL DATA:  Sickle cell crisis. EXAM: CHEST - 2 VIEW COMPARISON:  June 28, 2022 FINDINGS: There is stable right-sided venous Port-A-Cath positioning. The heart size and mediastinal contours are within normal limits. Mild to moderate severity increased interstitial lung  markings are seen with mild prominence of the bilateral perihilar pulmonary vasculature. There is no evidence of a pleural effusion or pneumothorax. No acute osseous abnormalities are identified. IMPRESSION: Findings consistent with mild to moderate severity pulmonary vascular congestion. Electronically Signed   By: Aram Candela M.D.   On: 09/14/2022 21:44    Scheduled Meds:  abacavir-dolutegravir-lamiVUDine  1 tablet Oral QHS   apixaban  5 mg Oral BID   Chlorhexidine Gluconate Cloth  6 each Topical Daily   folic acid  1 mg Oral Daily   HYDROmorphone   Intravenous Q4H   hydroxyurea  2,000 mg Oral QHS   polyethylene glycol  17 g Oral Daily   sodium chloride flush  10-40 mL Intracatheter Q12H   Continuous Infusions:  ceFEPime (MAXIPIME) IV 2 g (10/04/22 2044)   dextrose 5 % and 0.45 % NaCl 20 mL/hr at 10/04/22 0204    Principal Problem:   Sickle cell pain crisis [facility] Active Problems:   Human immunodeficiency virus (HIV) disease (HCC)   Chronic pain syndrome   Anemia of chronic disease   Hyperbilirubinemia             10/04/2022, 11:33 PM

## 2022-10-06 DIAGNOSIS — D5700 Hb-SS disease with crisis, unspecified: Secondary | ICD-10-CM | POA: Diagnosis not present

## 2022-10-06 MED ORDER — OXYCODONE-ACETAMINOPHEN 5-325 MG PO TABS
1.0000 | ORAL_TABLET | ORAL | Status: DC
  Administered 2022-10-06: 1 via ORAL
  Filled 2022-10-06: qty 1

## 2022-10-06 MED ORDER — OXYCODONE HCL 5 MG PO TABS
5.0000 mg | ORAL_TABLET | ORAL | Status: DC | PRN
  Administered 2022-10-06: 5 mg via ORAL
  Filled 2022-10-06: qty 1

## 2022-10-06 MED ORDER — HYDROMORPHONE HCL 1 MG/ML IJ SOLN
1.0000 mg | Freq: Four times a day (QID) | INTRAMUSCULAR | Status: DC | PRN
  Administered 2022-10-06: 1 mg via INTRAVENOUS
  Filled 2022-10-06: qty 1

## 2022-10-06 MED ORDER — HEPARIN SOD (PORK) LOCK FLUSH 100 UNIT/ML IV SOLN
500.0000 [IU] | INTRAVENOUS | Status: DC | PRN
  Filled 2022-10-06: qty 5

## 2022-10-06 NOTE — Progress Notes (Signed)
Day RN Zoe told me that she walked the patient in the hallway (to the vending machine and back). RN reported that on 3L his 02 Sat was at 96 percent ambulating. On 2L his O2 sat went down to 89 percent. She said on room air walking, patient's O2 sat was at 78 percent.Priscille Kluver

## 2022-10-06 NOTE — Progress Notes (Signed)
SATURATION QUALIFICATIONS: (This note is used to comply with regulatory documentation for home oxygen)  Patient Saturations on Room Air at Rest = 86%  Patient Saturations on Room Air while Ambulating = 78%  Patient Saturations on 3 Liters of oxygen while Ambulating = 96%  Please briefly explain why patient needs home oxygen:

## 2022-10-07 ENCOUNTER — Telehealth: Payer: Self-pay

## 2022-10-07 NOTE — Transitions of Care (Post Inpatient/ED Visit) (Signed)
   10/07/2022  Name: NAREK LENO MRN: [id number] DOB: July 24, 1990  Today's TOC FU Call Status: Today's TOC FU Call Status:: Unsuccessul Call (1st Attempt) Unsuccessful Call (1st Attempt) Date: 10/07/22  Attempted to reach the patient regarding the most recent Inpatient/ED visit.  Follow Up Plan: Additional outreach attempts will be made to reach the patient to complete the Transitions of Care (Post Inpatient/ED visit) call.   Signature TB,CMA

## 2022-10-08 ENCOUNTER — Telehealth: Payer: Self-pay

## 2022-10-08 NOTE — Discharge Summary (Addendum)
Physician Discharge Summary  Martin [name] ZOX:[id number] DOB: May 04, 1990 DOA: 09/25/2022  PCP: Massie Maroon, [name]  Admit date: 09/25/2022  Discharge date: 10/06/2022  Discharge Diagnoses:  Principal Problem:   Sickle cell pain crisis (HCC) Active Problems:   Human immunodeficiency virus (HIV) disease (HCC)   Chronic pain syndrome   Anemia of chronic disease   Hyperbilirubinemia   Discharge Condition: Stable  Disposition:   Follow-up Information     Massie Maroon, [name] Follow up in 1 week(s).   Specialty: Family Medicine Contact information: [address] [place] [address] [phone]                Pt is discharged home in good condition and is to follow up with Massie Maroon, [name] this week to have labs evaluated. Martin [name] is instructed to increase activity slowly and balance with rest for the next few days, and use prescribed medication to complete treatment of pain  Diet: Regular Wt Readings from Last 3 Encounters:  09/25/22 67.4 kg  09/15/22 67.4 kg  07/13/22 63.5 kg    History of present illness:  Martin [name]  is a 32 y.o. male with history significant for sickle cell disease, chronic pain syndrome, opiate dependence and tolerance, history of HIV, chronic respiratory failure on home oxygen, and anemia of chronic disease presents with complaints of allover body pain that is consistent with previous sickle cell crisis.  Patient was recently hospitalized with sickle cell pain crisis, pain intensity improved and patient was discharged home.  He states that 3 days prior pain intensity increased and he has not identified any inciting factors.  He rates pain as 10/10, constant, and throbbing.  He endorses some shortness of breath and chest tightness.  He denies any headache, dizziness, fever, chills, or paresthesias.  No urinary symptoms, nausea, vomiting, or diarrhea.   ER course: While in ER, patient's oxygen saturation  varied from 80s-90s.  Patient warranted a total of 6 L O2.  Complete blood count notable for WBCs 12.5, hemoglobin 7.8 g/dL, and platelets [phone],578.  Complete metabolic panel shows bilirubin elevated at 7.8.  Robust reticulocytosis.  Chest x-ray shows no acute cardiopulmonary process.  Pain persists despite IV Dilaudid, and IV fluids.  Patient admitted for sickle cell pain crisis.    Hospital Course:  Acute chest syndrome: Patient was admitted for acute chest syndrome in the setting of sickle cell pain crisis.  CT of chest showed patchy Perry bronchovesicular predominant groundglass opacities, new areas of groundglass attenuation are seen in the upper lobes.  Findings are specific but can be seen in the setting of acute chest syndrome.  Bilateral mosaic attenuation, likely due to mosaic perfusion.  Unchanged bilateral low lung prominent scarring.  Patient had oxygen requirements upwards of 6 L.  He was titrated to home oxygen level at 2-3 L/min.  Patient will discharge on home oxygen.  Patient received IV antibiotics for a total of 7 days.  He will not warrant any further antibiotic therapy.  Patient underwent simple exchange transfusions x 2 without complication. Prior to discharge, patient's hemoglobin 8.3 g/dL.  He will follow-up with PCP to repeat CBC with differential and CMP on 10/13/2022.  Patient's appointment has been confirmed. Patient's pain intensity is 6/10 primarily to his chest and lower back.  Patient states that he can manage at home on current medication regimen.  He will resume home pain medications. Also, patient will resume folic acid and hydroxyurea.  HIV  disease: CD4 around 31%.  Patient has not been taking antivirals consistently.  Stressed the importance of taking medications consistently in order to achieve positive outcomes.  Patient has been lost to follow-up with infectious disease provider in [place].  He will warrant a referral to infectious disease during hospital  follow-up.  Discussed with patient at length, expressed understanding.  Patient's vital signs stable.  Oxygen saturation is 92% on 3 L supplemental oxygen. Patient is aware of all upcoming appointments.  He is alert, oriented, and ambulating without assistance.   Patient was therefore discharged home today in a hemodynamically stable condition.   Martin [name] will follow-up with PCP within 1 week of this discharge. Martin [name] was counseled extensively about nonpharmacologic means of pain management, patient verbalized understanding and was appreciative of  the care received during this admission.   We discussed the need for good hydration, monitoring of hydration status, avoidance of heat, cold, stress, and infection triggers. We discussed the need to be adherent with taking Hydrea and other home medications. Patient was reminded of the need to seek medical attention immediately if any symptom of bleeding, anemia, or infection occurs.  Discharge Exam: Vitals:   10/06/22 1018 10/06/22 1310  BP: 102/68 99/67  Pulse: 69 67  Resp: 20 20  Temp: 97.9 F (36.6 C) 98.3 F (36.8 C)  SpO2: 94% 92%   Vitals:   10/06/22 0532 10/06/22 0732 10/06/22 1018 10/06/22 1310  BP: (!) 103/58  102/68 99/67  Pulse: 74  69 67  Resp: 16 16 20 20   Temp: 98.4 F (36.9 C)  97.9 F (36.6 C) 98.3 F (36.8 C)  TempSrc: Oral  Oral Oral  SpO2: 93% 94% 94% 92%  Weight:      Height:        General appearance : Awake, alert, not in any distress. Speech Clear. Not toxic looking HEENT: Atraumatic and Normocephalic, pupils equally reactive to light and accomodation Neck: Supple, no JVD. No cervical lymphadenopathy.  Chest: Good air entry bilaterally, no added sounds  CVS: S1 S2 regular, no murmurs.  Abdomen: Bowel sounds present, Non tender and not distended with no gaurding, rigidity or rebound. Extremities: B/L Lower Ext shows no edema, both legs are warm to touch Neurology: Awake alert, and oriented X 3, CN II-XII  intact, Non focal Skin: No Rash  Discharge Instructions  Discharge Instructions     Discharge patient   Complete by: As directed    Discharge disposition: 01-Home or Self Care   Discharge patient date: 10/06/2022      Allergies as of 10/06/2022       Reactions   Ketorolac Tromethamine Swelling, Other (See Comments)   Patient reports facial edema and left arm edema after administration.   Tape Rash, Other (See Comments)   PLEASE DO NOT USE THE CLEAR, THICK, "PLASTIC" TAPE- only paper tape is tolerated    Wound Dressing Adhesive Rash        Medication List     TAKE these medications    Eliquis 5 MG Tabs tablet Generic drug: apixaban Take 1 tablet (5 mg total) by mouth 2 (two) times daily.   folic acid 1 MG tablet Commonly known as: FOLVITE Take 1 mg by mouth daily.   gabapentin 100 MG capsule Commonly known as: NEURONTIN Take 200 mg by mouth as needed (pain).   hydroxyurea 500 MG capsule Commonly known as: HYDREA Take 4 capsules (2,000 mg total) by mouth at bedtime.   oxyCODONE-acetaminophen 10-325 MG tablet Commonly  known as: Percocet Take 1 tablet by mouth every 4 hours as needed for pain.   Triumeq 600-50-300 MG tablet Generic drug: abacavir-dolutegravir-lamiVUDine Take 1 tablet by mouth at bedtime.        The results of significant diagnostics from this hospitalization (including imaging, microbiology, ancillary and laboratory) are listed below for reference.    Significant Diagnostic Studies: CT CHEST WO CONTRAST  Result Date: 09/25/2022 CLINICAL DATA:  Dyspnea, concern for sickle cell crisis EXAM: CT CHEST WITHOUT CONTRAST TECHNIQUE: Multidetector CT imaging of the chest was performed following the standard protocol without IV contrast. RADIATION DOSE REDUCTION: This exam was performed according to the departmental dose-optimization program which includes automated exposure control, adjustment of the mA and/or kV according to patient size and/or use of  iterative reconstruction technique. COMPARISON:  Chest CT dated Sep 15, 2022 FINDINGS: Cardiovascular: Mild cardiomegaly. No pericardial effusion. Normal caliber thoracic aorta no significant atherosclerotic disease. No coronary artery calcifications. Right chest wall port with tip in the SVC. Mediastinum/Nodes: Esophagus and thyroid are unremarkable. No enlarged lymph nodes seen in the chest. Lungs/Pleura: Central airways are patent. Bilateral mosaic attenuation. Redemonstrated patchy peribronchovascular predominant ground-glass opacities, new areas of ground-glass attenuation are seen in the upper lobes. Lower lung predominant scattered linear opacities, likely due to scarring. No pleural effusion or pneumothorax. Upper Abdomen: No acute abnormality. Musculoskeletal: No chest wall mass or suspicious bone lesions identified. IMPRESSION: 1. Redemonstrated patchy peribronchovascular predominant ground-glass opacities, new areas of ground-glass attenuation are seen in the upper lobes. Findings are nonspecific but can be seen in the setting of acute chest syndrome. 2. Bilateral mosaic attenuation, likely due to mosaic perfusion. 3. Unchanged bilateral lower lung predominant scarring. Electronically Signed   By: Allegra Lai M.D.   On: 09/25/2022 15:14   DG Chest 2 View  Result Date: 09/25/2022 CLINICAL DATA:  Sickle cell crisis and pain, initial encounter EXAM: CHEST - 2 VIEW COMPARISON:  09/14/2022 FINDINGS: Cardiac shadow is stable. Right chest wall port is again identified with a focal loop near the venous entry site. Diffuse interstitial changes are noted in the lungs bilaterally. No sizable effusion is seen. No bony abnormality is noted. IMPRESSION: Mild interstitial changes likely representing edema. Electronically Signed   By: Alcide Clever M.D.   On: 09/25/2022 03:17   ECHOCARDIOGRAM COMPLETE  Result Date: 09/15/2022    ECHOCARDIOGRAM REPORT   Patient Name:   Martin [name] Date of Exam:  09/15/2022 Medical Rec #:  [id number]           Height:       75.0 in Accession #:    [id number]          Weight:       148.6 lb Date of Birth:  1991-03-29           BSA:          1.934 m Patient Age:    31 years            BP:           116/76 mmHg Patient Gender: M                   HR:           68 bpm. Exam Location:  Inpatient Procedure: 2D Echo, Cardiac Doppler and Color Doppler Indications:    CHF - Acute Systolic  History:        Patient has prior history of Echocardiogram examinations, most  recent 07/14/2021. Sickle cell, Signs/Symptoms:respiratory                 failure; Risk Factors:HIV+, hx of PE.  Sonographer:    Wallie Char Referring Phys: [id number] RATHORE  Sonographer Comments: Global longitudinal strain was attempted. IMPRESSIONS  1. Left ventricular ejection fraction, by estimation, is 55 to 60%. The left ventricle has normal function. The left ventricle has no regional wall motion abnormalities. Left ventricular diastolic parameters were normal. The average left ventricular global longitudinal strain is -22.0 %. The global longitudinal strain is normal.  2. Right ventricular systolic function is normal. The right ventricular size is normal. There is mildly elevated pulmonary artery systolic pressure. The estimated right ventricular systolic pressure is 43.0 mmHg.  3. Left atrial size was mildly dilated.  4. The mitral valve is grossly normal. Mild mitral valve regurgitation. No evidence of mitral stenosis.  5. The aortic valve is tricuspid. Aortic valve regurgitation is not visualized. No aortic stenosis is present.  6. The inferior vena cava is dilated in size with >50% respiratory variability, suggesting right atrial pressure of 8 mmHg. FINDINGS  Left Ventricle: Left ventricular ejection fraction, by estimation, is 55 to 60%. The left ventricle has normal function. The left ventricle has no regional wall motion abnormalities. The average left ventricular global  longitudinal strain is -22.0 %. The global longitudinal strain is normal. The left ventricular internal cavity size was normal in size. There is no left ventricular hypertrophy. Left ventricular diastolic parameters were normal. Right Ventricle: The right ventricular size is normal. No increase in right ventricular wall thickness. Right ventricular systolic function is normal. There is mildly elevated pulmonary artery systolic pressure. The tricuspid regurgitant velocity is 2.96  m/s, and with an assumed right atrial pressure of 8 mmHg, the estimated right ventricular systolic pressure is 43.0 mmHg. Left Atrium: Left atrial size was mildly dilated. Right Atrium: Right atrial size was normal in size. Pericardium: Trivial pericardial effusion is present. Mitral Valve: The mitral valve is grossly normal. Mild mitral valve regurgitation. No evidence of mitral valve stenosis. MV peak gradient, 4.7 mmHg. The mean mitral valve gradient is 3.0 mmHg. Tricuspid Valve: The tricuspid valve is grossly normal. Tricuspid valve regurgitation is mild . No evidence of tricuspid stenosis. Aortic Valve: The aortic valve is tricuspid. Aortic valve regurgitation is not visualized. No aortic stenosis is present. Aortic valve mean gradient measures 4.0 mmHg. Aortic valve peak gradient measures 6.8 mmHg. Aortic valve area, by VTI measures 4.00 cm. Pulmonic Valve: The pulmonic valve was grossly normal. Pulmonic valve regurgitation is trivial. No evidence of pulmonic stenosis. Aorta: The aortic root and ascending aorta are structurally normal, with no evidence of dilitation. Venous: The inferior vena cava is dilated in size with greater than 50% respiratory variability, suggesting right atrial pressure of 8 mmHg. IAS/Shunts: The atrial septum is grossly normal.  LEFT VENTRICLE PLAX 2D LVIDd:         5.40 cm      Diastology LVIDs:         3.70 cm      LV e' medial:    10.60 cm/s LV PW:         1.00 cm      LV E/e' medial:  11.5 LV IVS:         0.90 cm      LV e' lateral:   12.80 cm/s LVOT diam:     2.30 cm      LV E/e' lateral: 9.5  LV SV:         120 LV SV Index:   62           2D Longitudinal Strain LVOT Area:     4.15 cm     2D Strain GLS Avg:     -22.0 %  LV Volumes (MOD) LV vol d, MOD A2C: 154.0 ml LV vol d, MOD A4C: 146.0 ml LV vol s, MOD A2C: 66.3 ml LV vol s, MOD A4C: 60.1 ml LV SV MOD A2C:     87.7 ml LV SV MOD A4C:     146.0 ml LV SV MOD BP:      88.7 ml RIGHT VENTRICLE RV Basal diam:  3.80 cm RV S prime:     15.00 cm/s TAPSE (M-mode): 2.4 cm LEFT ATRIUM             Index        RIGHT ATRIUM           Index LA diam:        4.60 cm 2.38 cm/m   RA Area:     19.80 cm LA Vol (A2C):   70.5 ml 36.45 ml/m  RA Volume:   57.40 ml  29.68 ml/m LA Vol (A4C):   78.3 ml 40.48 ml/m LA Biplane Vol: 75.9 ml 39.24 ml/m  AORTIC VALVE AV Area (Vmax):    4.03 cm AV Area (Vmean):   3.92 cm AV Area (VTI):     4.00 cm AV Vmax:           130.00 cm/s AV Vmean:          95.650 cm/s AV VTI:            0.300 m AV Peak Grad:      6.8 mmHg AV Mean Grad:      4.0 mmHg LVOT Vmax:         126.00 cm/s LVOT Vmean:        90.150 cm/s LVOT VTI:          0.289 m LVOT/AV VTI ratio: 0.96  AORTA Ao Root diam: 3.90 cm Ao Asc diam:  3.70 cm MITRAL VALVE                TRICUSPID VALVE MV Area (PHT): 3.24 cm     TR Peak grad:   35.0 mmHg MV Area VTI:   3.81 cm     TR Vmax:        296.00 cm/s MV Peak grad:  4.7 mmHg MV Mean grad:  3.0 mmHg     SHUNTS MV Vmax:       1.08 m/s     Systemic VTI:  0.29 m MV Vmean:      77.0 cm/s    Systemic Diam: 2.30 cm MV Decel Time: 234 msec MV E velocity: 122.00 cm/s MV A velocity: 105.00 cm/s MV E/A ratio:  1.16 Lennie Odor MD Electronically signed by Lennie Odor MD Signature Date/Time: 09/15/2022/11:36:52 AM    Final    CT Angio Chest PE W and/or Wo Contrast  Result Date: 09/15/2022 CLINICAL DATA:  History of sickle cell anemia and HIV presenting with back pain x1 day. EXAM: CT ANGIOGRAPHY CHEST WITH CONTRAST TECHNIQUE: Multidetector CT  imaging of the chest was performed using the standard protocol during bolus administration of intravenous contrast. Multiplanar CT image reconstructions and MIPs were obtained to evaluate the vascular anatomy. RADIATION DOSE REDUCTION: This exam was performed according to the departmental dose-optimization program  which includes automated exposure control, adjustment of the mA and/or kV according to patient size and/or use of iterative reconstruction technique. CONTRAST:  75mL OMNIPAQUE IOHEXOL 350 MG/ML SOLN COMPARISON:  August 03, 2021 FINDINGS: Cardiovascular: There is stable right-sided venous Port-A-Cath positioning. The thoracic aorta is normal in appearance. Satisfactory opacification of the pulmonary arteries to the segmental level. No evidence of pulmonary embolism. Normal heart size. No pericardial effusion. Mediastinum/Nodes: Stable right hilar lymphadenopathy is seen. Thyroid gland, trachea, and esophagus demonstrate no significant findings. Lungs/Pleura: Stable, mild to moderate severity patchy areas of ground-glass appearing parenchyma are seen scattered throughout both lungs. Stable areas of focal scarring and/or atelectasis are seen within the bilateral upper lobes. Moderate severity the lingular and bilateral lower lobe scarring and/or atelectasis is also seen. No pleural effusion or pneumothorax is identified. Upper Abdomen: Surgical clips are seen within the gallbladder fossa. A small, densely calcified spleen is noted. Musculoskeletal: No chest wall abnormality. No acute or significant osseous findings. Review of the MIP images confirms the above findings. IMPRESSION: 1. No evidence of pulmonary embolism. 2. Stable, mild to moderate severity patchy areas of ground-glass appearing parenchyma scattered throughout both lungs which may represent sequelae associated with an atypical infection. 3. Stable areas of focal scarring and/or atelectasis within the bilateral upper lobes. 4. Moderate severity  lingular and bilateral lower lobe scarring and/or atelectasis. 5. Stable right hilar lymphadenopathy. 6. Evidence of prior cholecystectomy. Electronically Signed   By: Aram Candela M.D.   On: 09/15/2022 01:11   DG Chest 2 View  Result Date: 09/14/2022 CLINICAL DATA:  Sickle cell crisis. EXAM: CHEST - 2 VIEW COMPARISON:  June 28, 2022 FINDINGS: There is stable right-sided venous Port-A-Cath positioning. The heart size and mediastinal contours are within normal limits. Mild to moderate severity increased interstitial lung markings are seen with mild prominence of the bilateral perihilar pulmonary vasculature. There is no evidence of a pleural effusion or pneumothorax. No acute osseous abnormalities are identified. IMPRESSION: Findings consistent with mild to moderate severity pulmonary vascular congestion. Electronically Signed   By: Aram Candela M.D.   On: 09/14/2022 21:44    Microbiology: No results found for this or any previous visit (from the past 240 hour(s)).   Labs: Basic Metabolic Panel: Recent Labs  Lab 10/02/22 0930  NA 136  K 4.2  CL 103  CO2 27  GLUCOSE 104*  BUN 12  CREATININE 0.78  CALCIUM 8.6*   Liver Function Tests: Recent Labs  Lab 10/02/22 0930  AST 27  ALT 13  ALKPHOS 54  BILITOT 4.3*  PROT 8.1  ALBUMIN 3.6   No results for input(s): "LIPASE", "AMYLASE" in the last 168 hours. No results for input(s): "AMMONIA" in the last 168 hours. CBC: Recent Labs  Lab 10/02/22 0930 10/03/22 0434  WBC 8.2 6.7  NEUTROABS 4.6  --   HGB 7.9* 8.3*  HCT 22.7* 23.7*  MCV 97.8 98.3  PLT 808* 826*   Cardiac Enzymes: No results for input(s): "CKTOTAL", "CKMB", "CKMBINDEX", "TROPONINI" in the last 168 hours. BNP: Invalid input(s): "POCBNP" CBG: No results for input(s): "GLUCAP" in the last 168 hours.  Time coordinating discharge: 40 minutes  Signed:  [name]  APRN, [name], [name] Patient Care [facility] [address]  [place], [place] [address] [phone]   [facility] 10/08/2022, 4:29 PM

## 2022-10-08 NOTE — Transitions of Care (Post Inpatient/ED Visit) (Signed)
   10/08/2022  Name: Martin [name] MRN: [id number] DOB: [birth year]  Today's TOC FU Call Status: Today's TOC FU Call Status:: Successful TOC FU Call Competed TOC FU Call Complete Date: 10/08/22  Transition Care Management Follow-up Telephone Call Date of Discharge: 10/06/22 Discharge Facility: [facility]) Type of Discharge: Inpatient Admission Primary Inpatient Discharge Diagnosis:: SS How have you been since you were released from the hospital?: Better Any questions or concerns?: No  Items Reviewed: Did you receive and understand the discharge instructions provided?: Yes Medications obtained,verified, and reconciled?: Yes (Medications Reviewed) Any new allergies since your discharge?: No Dietary orders reviewed?: NA Do you have support at home?: Yes People in Home: parent(s)  Medications Reviewed Today: Medications Reviewed Today     Reviewed by Massie Maroon, [name] (Family Nurse Practitioner) on 09/25/22 at [phone]  Med List Status: Complete   Medication Order Taking? Sig Documenting Provider Last Dose Status Informant  abacavir-dolutegravir-lamiVUDine (TRIUMEQ) 600-50-300 MG tablet [id number] Yes Take 1 tablet by mouth at bedtime. [provider] 09/24/2022 Active Self           Med Note (CRUTHIS, CHLOE C   Mon Jul 13, 2022  1:47 PM) No fill hx found for this medication. Pt is adamant she is taking it daily.   apixaban (ELIQUIS) 5 MG TABS tablet [id number] Yes Take 1 tablet (5 mg total) by mouth 2 (two) times daily. Massie Maroon, [name] 09/24/2022 2100 Active Self           Med Note (CRUTHIS, CHLOE Susy Frizzle Jul 13, 2022  1:47 PM) LF 01/24 for 30 DS. Pt is adamant she is still taking this medication BID. Dispense report does not support this claim.   folic acid (FOLVITE) 1 MG tablet [id number] Yes Take 1 mg by mouth daily. [provider] 09/24/2022 Active Self  gabapentin (NEURONTIN) 100 MG capsule [id number] Yes Take 200 mg by mouth as needed (pain). [provider] unknown Active Self           Med Note Cyndie Chime, [facility]   Mon Jun 22, 2022 11:34 PM)    hydroxyurea (HYDREA) 500 MG capsule [id number] Yes Take 4 capsules (2,000 mg total) by mouth at bedtime. Massie Maroon, [name] 09/24/2022 Active Self           Med Note (CRUTHIS, CHLOE C   Mon Jul 13, 2022  1:48 PM) No fill hx found for this medication. Pt is adamant she is taking it.   oxyCODONE-acetaminophen (PERCOCET) 10-325 MG tablet [id number] Yes Take 1 tablet by mouth every 4 hours as needed for pain. Massie Maroon, [name] 09/24/2022 Active Self            Home Care and Equipment/Supplies: Were Home Health Services Ordered?: No Any new equipment or medical supplies ordered?: No  Functional Questionnaire: Do you need assistance with bathing/showering or dressing?: No Do you need assistance with meal preparation?: No Do you need assistance with eating?: No Do you have difficulty maintaining continence: No Do you need assistance with getting out of bed/getting out of a chair/moving?: No Do you have difficulty managing or taking your medications?: No  Follow up appointments reviewed: PCP Follow-up appointment confirmed?: Yes Date of PCP follow-up appointment?: 10/13/22 Follow-up Provider: [facility] Follow-up appointment confirmed?: NA Do you need transportation to your follow-up appointment?: No Do you understand care options if your condition(s) worsen?: Yes-patient verbalized understanding    SIGNATURE TB,CMA

## 2022-10-12 ENCOUNTER — Other Ambulatory Visit: Payer: Self-pay | Admitting: Internal Medicine

## 2022-10-12 ENCOUNTER — Other Ambulatory Visit (HOSPITAL_COMMUNITY): Payer: Self-pay

## 2022-10-12 ENCOUNTER — Other Ambulatory Visit: Payer: Self-pay | Admitting: Family Medicine

## 2022-10-12 DIAGNOSIS — F1190 Opioid use, unspecified, uncomplicated: Secondary | ICD-10-CM

## 2022-10-12 DIAGNOSIS — D571 Sickle-cell disease without crisis: Secondary | ICD-10-CM

## 2022-10-12 MED ORDER — OXYCODONE-ACETAMINOPHEN 10-325 MG PO TABS
1.0000 | ORAL_TABLET | ORAL | 0 refills | Status: DC | PRN
Start: 2022-10-12 — End: 2022-11-02

## 2022-10-12 NOTE — Telephone Encounter (Signed)
Caller & Relationship to patient:  MRN #  [id number]   Call Back Number:   Date of Last Office Visit: 10/01/2022     Date of Next Office Visit: 10/13/2022    Medication(s) to be Refilled: Percocet  Preferred Pharmacy: [facility]  ** Please notify patient to allow 48-72 hours to process** **Let patient know to contact pharmacy at the end of the day to make sure medication is ready. ** **If patient has not been seen in a year or longer, book an appointment **Advise to use MyChart for refill requests OR to contact their pharmacy

## 2022-10-12 NOTE — Progress Notes (Signed)
Reviewed PDMP substance reporting system prior to prescribing opiate medications. No inconsistencies noted.  Meds ordered this encounter  Medications   oxyCODONE-acetaminophen (PERCOCET) 10-325 MG tablet    Sig: Take 1 tablet by mouth every 4 hours as needed for pain.    Dispense:  90 tablet    Refill:  0    Order Specific Question:   Supervising Provider    Answer:   JEGEDE, OLUGBEMIGA E [[id number]]   Esteven Overfelt Moore Clifton Safley  APRN, [name], [name] [facility] [address]  [place], [place] [address] [phone]  

## 2022-10-13 ENCOUNTER — Ambulatory Visit (INDEPENDENT_AMBULATORY_CARE_PROVIDER_SITE_OTHER): Payer: Medicaid Other | Admitting: Family Medicine

## 2022-10-13 VITALS — BP 100/64 | HR 77 | Temp 97.7°F | Wt 145.2 lb

## 2022-10-13 DIAGNOSIS — Z86711 Personal history of pulmonary embolism: Secondary | ICD-10-CM

## 2022-10-13 DIAGNOSIS — J9611 Chronic respiratory failure with hypoxia: Secondary | ICD-10-CM

## 2022-10-13 DIAGNOSIS — B20 Human immunodeficiency virus [HIV] disease: Secondary | ICD-10-CM

## 2022-10-13 DIAGNOSIS — D571 Sickle-cell disease without crisis: Secondary | ICD-10-CM | POA: Diagnosis not present

## 2022-10-13 DIAGNOSIS — D5700 Hb-SS disease with crisis, unspecified: Secondary | ICD-10-CM

## 2022-10-13 DIAGNOSIS — Z09 Encounter for follow-up examination after completed treatment for conditions other than malignant neoplasm: Secondary | ICD-10-CM

## 2022-10-13 DIAGNOSIS — F1190 Opioid use, unspecified, uncomplicated: Secondary | ICD-10-CM

## 2022-10-13 DIAGNOSIS — E559 Vitamin D deficiency, unspecified: Secondary | ICD-10-CM

## 2022-10-13 NOTE — Progress Notes (Signed)
Established Patient Office Visit  Subjective   Patient ID: Martin [name], male    DOB: [birth year]  Age: 32 y.o. MRN: [id number]  Chief Complaint  Patient presents with   Sickle Cell Anemia    Martin [name] is a 32 year old male with a medical history significant for sickle cell disease, chronic pain syndrome, opiate dependence and tolerance, history of HIV, history of PE on Eliquis, and anemia of chronic disease presents for posthospital follow-up.  Patient was hospitalized from 6/76/7 - 10/06/2022 for acute chest syndrome in the setting of sickle cell pain crisis.  During hospital admission, patient was treated with simple exchange transfusion, IV antibiotics, and supplemental oxygen.  Since hospital discharge, patient states that he has been wearing supplemental oxygen consistently at 3 L.  Patient is not followed by pulmonology at this time.  He has a long history of chronic respiratory failure with hypoxia.  Prior to admission, patient has not been wearing his home oxygen consistently. Patient has a history of HIV.  He has been lost to follow-up with infectious disease in [place].  He says that he has been taking antivirals consistently.  During previous hospitalization, CD4 count was noted to be decreased.  Patient continues to have fatigue and allover body pain.  He rates pain as 6/10.  Patient last had Percocet 10-325 mg this a.m. without sustained relief.      Review of Systems  Constitutional:  Positive for malaise/fatigue. Negative for chills and fever.  HENT: Negative.    Respiratory:  Positive for shortness of breath.   Cardiovascular: Negative.   Gastrointestinal: Negative.   Genitourinary: Negative.   Musculoskeletal:  Positive for back pain and joint pain.  Skin: Negative.   Neurological: Negative.   Psychiatric/Behavioral: Negative.        Objective:     BP 100/64   Pulse 77   Temp 97.7 F (36.5 C)   Wt 145 lb 3.2 oz (65.9 kg)   SpO2 96%   BMI  18.15 kg/m     Physical Exam Constitutional:      Appearance: He is ill-appearing.  Eyes:     Pupils: Pupils are equal, round, and reactive to light.  Cardiovascular:     Rate and Rhythm: Normal rate and regular rhythm.     Pulses: Normal pulses.  Pulmonary:     Effort: Pulmonary effort is normal. No respiratory distress.     Breath sounds: No wheezing or rhonchi.  Abdominal:     General: Bowel sounds are normal.  Skin:    General: Skin is warm.  Neurological:     General: No focal deficit present.     Mental Status: He is oriented to person, place, and time. Mental status is at baseline.  Psychiatric:        Mood and Affect: Mood normal.        Thought Content: Thought content normal.        Judgment: Judgment normal.      Results for orders placed or performed in visit on 10/13/22  [id number] 11+Oxyco+Alc+Crt-Bund  Result Value Ref Range   Ethanol Negative Cutoff=0.020 %   Amphetamines, Urine Negative Cutoff=1000 ng/mL   Barbiturate Negative Cutoff=200 ng/mL   BENZODIAZ UR QL Negative Cutoff=200 ng/mL   Cannabinoid Quant, Ur Negative Cutoff=50 ng/mL   Cocaine (Metabolite) Negative Cutoff=300 ng/mL   OPIATE SCREEN URINE See Final Results Cutoff=300 ng/mL   Oxycodone/Oxymorphone, Urine See Final Results Cutoff=300 ng/mL   Phencyclidine Negative Cutoff=25 ng/mL   Methadone  Screen, Urine Negative Cutoff=300 ng/mL   Propoxyphene Negative Cutoff=300 ng/mL   Meperidine Negative Cutoff=200 ng/mL   Tramadol Negative Cutoff=200 ng/mL   Creatinine 99.7 20.0 - 300.0 mg/dL   pH, Urine 5.7 4.5 - 8.9  Sickle Cell Panel  Result Value Ref Range   Glucose 95 70 - 99 mg/dL   BUN 5 (L) 6 - 20 mg/dL   Creatinine, Ser 1.61 (L) 0.76 - 1.27 mg/dL   eGFR 096 >04 VW/UJW/1.19   BUN/Creatinine Ratio 7 (L) 9 - 20   Sodium 142 134 - 144 mmol/L   Potassium 3.9 3.5 - 5.2 mmol/L   Chloride 105 96 - 106 mmol/L   CO2 20 20 - 29 mmol/L   Calcium 9.7 8.7 - 10.2 mg/dL   Total Protein 7.4 6.0 -  8.5 g/dL   Albumin 4.5 4.1 - 5.1 g/dL   Globulin, Total 2.9 1.5 - 4.5 g/dL   Bilirubin Total 3.7 (H) 0.0 - 1.2 mg/dL   Alkaline Phosphatase 67 44 - 121 IU/L   AST 19 0 - 40 IU/L   ALT 9 0 - 44 IU/L   Ferritin 3,232 (H) 30 - 400 ng/mL   Vit D, 25-Hydroxy 14.8 (L) 30.0 - 100.0 ng/mL   WBC 9.7 3.4 - 10.8 x10E3/uL   RBC 2.70 (LL) 4.14 - 5.80 x10E6/uL   Hemoglobin 7.1 (L) 13.0 - 17.7 g/dL   Hematocrit 14.7 (L) 82.9 - 51.0 %   MCV 83 79 - 97 fL   MCH 26.3 (L) 26.6 - 33.0 pg   MCHC 31.6 31.5 - 35.7 g/dL   RDW 56.2 (H) 13.0 - 86.5 %   Platelets 344 150 - 450 x10E3/uL   Neutrophils 63 Not Estab. %   Lymphs 23 Not Estab. %   Monocytes 9 Not Estab. %   Eos 3 Not Estab. %   Basos 1 Not Estab. %   Neutrophils Absolute 6.2 1.4 - 7.0 x10E3/uL   Lymphocytes Absolute 2.2 0.7 - 3.1 x10E3/uL   Monocytes Absolute 0.9 0.1 - 0.9 x10E3/uL   EOS (ABSOLUTE) 0.3 0.0 - 0.4 x10E3/uL   Basophils Absolute 0.1 0.0 - 0.2 x10E3/uL   Immature Granulocytes 1 Not Estab. %   Immature Grans (Abs) 0.1 0.0 - 0.1 x10E3/uL   NRBC 1 (H) 0 - 0 %   Hematology Comments: Note:    Retic Ct Pct 7.9 (H) 0.6 - 2.6 %  Opiates Confirmation, Urine  Result Value Ref Range   Opiates Negative Cutoff=300 ng/mL  Oxycodone/Oxymorphone, Confirm  Result Value Ref Range   OXYCODONE/OXYMORPH Positive (A) Cutoff=300   OXYCODONE Positive (A)    OXYCODONE 874 Cutoff=300 ng/mL   OXYMORPHONE Positive (A)    OXYMORPHONE (GC/MS) 1,841 Cutoff=300 ng/mL    Last CBC Lab Results  Component Value Date   WBC 15.2 (H) 10/15/2022   HGB 9.2 (L) 10/15/2022   HCT 26.3 (L) 10/15/2022   MCV 99.6 10/15/2022   MCH 34.8 (H) 10/15/2022   RDW 23.2 (H) 10/15/2022   PLT 258 10/15/2022      The ASCVD Risk score (Arnett DK, et al., 2019) failed to calculate for the following reasons:   The 2019 ASCVD risk score is only valid for ages 63 to 50    Assessment & Plan:   Problem List Items Addressed This Visit       Other   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 Hb-SS disease without crisis [facility]) Patient continues to have increased allover body pain.  He is advised to follow-up in sickle cell day infusion clinic for further workup and evaluation. He states that he will attempt to manage at home, however if pain intensity worsens he will follow-up in the emergency department. - Sickle Cell Panel  2. Chronic, continuous use of opioids Reviewed PDMP substance reporting system prior to prescribing opiate medications. No inconsistencies noted.   - [id number] 11+Oxyco+Alc+Crt-Bund  3. Sickle cell pain crisis (HCC)   4. History of pulmonary embolus (PE) Continue Eliquis  5. Chronic respiratory failure with hypoxia (HCC)  - Ambulatory referral to Pulmonology  6. Human immunodeficiency virus (HIV) disease (HCC) Discussed the importance of taking antiviral medications consistently in order to achieve positive outcomes.  Patient expressed understanding.  Also, he wants a new referral to infectious disease. - Ambulatory referral to Infectious Disease  7. Vitamin D deficiency  - Sickle Cell Panel   8.  Hospital discharge follow-up: Patient will continue 3 L supplemental oxygen.  At this juncture, he warrants a referral to pulmonology.  Follow up in 3 months  Perri Lamagna Rennis Petty  APRN, [name], [name] [facility]  [facility] [address] [place], [place] [address] [phone]

## 2022-10-14 ENCOUNTER — Other Ambulatory Visit: Payer: Self-pay

## 2022-10-14 ENCOUNTER — Other Ambulatory Visit (HOSPITAL_COMMUNITY): Payer: Self-pay

## 2022-10-14 ENCOUNTER — Other Ambulatory Visit: Payer: Self-pay | Admitting: Family Medicine

## 2022-10-14 ENCOUNTER — Emergency Department (HOSPITAL_COMMUNITY)
Admission: EM | Admit: 2022-10-14 | Discharge: 2022-10-15 | Disposition: A | Discharge status: Home / Self Care | Payer: Medicaid Other | Attending: Emergency Medicine | Admitting: Emergency Medicine

## 2022-10-14 DIAGNOSIS — G894 Chronic pain syndrome: Secondary | ICD-10-CM | POA: Diagnosis not present

## 2022-10-14 DIAGNOSIS — Z7901 Long term (current) use of anticoagulants: Secondary | ICD-10-CM | POA: Diagnosis not present

## 2022-10-14 DIAGNOSIS — Z21 Asymptomatic human immunodeficiency virus [HIV] infection status: Secondary | ICD-10-CM | POA: Insufficient documentation

## 2022-10-14 DIAGNOSIS — D5700 Hb-SS disease with crisis, unspecified: Secondary | ICD-10-CM | POA: Insufficient documentation

## 2022-10-14 DIAGNOSIS — F1120 Opioid dependence, uncomplicated: Secondary | ICD-10-CM | POA: Diagnosis not present

## 2022-10-14 DIAGNOSIS — E559 Vitamin D deficiency, unspecified: Secondary | ICD-10-CM

## 2022-10-14 DIAGNOSIS — M545 Low back pain, unspecified: Secondary | ICD-10-CM | POA: Diagnosis present

## 2022-10-14 LAB — CMP14+CBC/D/PLT+FER+RETIC+V...
ALT: 9 IU/L (ref 0–44)
AST: 19 IU/L (ref 0–40)
Alkaline Phosphatase: 67 IU/L (ref 44–121)
Chloride: 105 mmol/L (ref 96–106)
Globulin, Total: 2.9 g/dL (ref 1.5–4.5)
Potassium: 3.9 mmol/L (ref 3.5–5.2)
Total Protein: 7.4 g/dL (ref 6.0–8.5)
Vit D, 25-Hydroxy: 14.8 ng/mL — ABNORMAL LOW (ref 30.0–100.0)

## 2022-10-14 LAB — DRUG SCREEN 764883 11+OXYCO+ALC+CRT-BUND

## 2022-10-14 MED ORDER — ERGOCALCIFEROL 1.25 MG (50000 UT) PO CAPS
50000.0000 [IU] | ORAL_CAPSULE | ORAL | 3 refills | Status: DC
Start: 2022-10-14 — End: 2023-08-13

## 2022-10-14 MED ORDER — DEFERASIROX 90 MG PO TABS
1.0000 | ORAL_TABLET | Freq: Every day | ORAL | 2 refills | Status: DC
Start: 2022-10-14 — End: 2022-10-19

## 2022-10-14 NOTE — ED Triage Notes (Signed)
Patient is c/o SCC. Pain started earlier this afternoon. Patient has been taking the prescribed percocet as ordered but pain has gotten worse. Here for generalized pain throughout and pain in his lower back and hips.

## 2022-10-14 NOTE — Progress Notes (Signed)
Martin [name] is a 32 year old male with a medical history significant for sickle cell disease, chronic pain syndrome, opiate dependence and tolerance, history of HIV, history of PE on Eliquis, and anemia of chronic disease presented on 10/13/2022 for post hospital follow-up.  Ferritin level is greater than 2000, indicative of iron overload.  Will start Jadenu 90 mg daily.  Also, vitamin D markedly decreased.  Initiate Drisdol 50,000 IUs weekly.  Will reassess labs in 3 months.  Please ensure that patient has a 56-month follow-up scheduled.  Will continue hydroxyurea and folic acid without any changes.  [name]  APRN, [name], [name] [facility] [address] [place], [place] [address] [phone]

## 2022-10-15 ENCOUNTER — Telehealth (HOSPITAL_COMMUNITY): Payer: Self-pay

## 2022-10-15 ENCOUNTER — Non-Acute Institutional Stay (HOSPITAL_BASED_OUTPATIENT_CLINIC_OR_DEPARTMENT_OTHER)
Admission: AD | Admit: 2022-10-15 | Discharge: 2022-10-15 | Disposition: A | Discharge status: Home / Self Care | Payer: Medicaid Other | Source: Ambulatory Visit | Attending: Internal Medicine | Admitting: Internal Medicine

## 2022-10-15 DIAGNOSIS — Z21 Asymptomatic human immunodeficiency virus [HIV] infection status: Secondary | ICD-10-CM | POA: Insufficient documentation

## 2022-10-15 DIAGNOSIS — G894 Chronic pain syndrome: Secondary | ICD-10-CM | POA: Insufficient documentation

## 2022-10-15 DIAGNOSIS — F1120 Opioid dependence, uncomplicated: Secondary | ICD-10-CM | POA: Insufficient documentation

## 2022-10-15 DIAGNOSIS — D5700 Hb-SS disease with crisis, unspecified: Secondary | ICD-10-CM | POA: Diagnosis present

## 2022-10-15 DIAGNOSIS — Z7901 Long term (current) use of anticoagulants: Secondary | ICD-10-CM | POA: Insufficient documentation

## 2022-10-15 LAB — BASIC METABOLIC PANEL
Anion gap: 9 (ref 5–15)
BUN: 9 mg/dL (ref 6–20)
CO2: 23 mmol/L (ref 22–32)
Calcium: 8.2 mg/dL — ABNORMAL LOW (ref 8.9–10.3)
Chloride: 108 mmol/L (ref 98–111)
Creatinine, Ser: 0.66 mg/dL (ref 0.61–1.24)
GFR, Estimated: >60 mL/min (ref >60)
Glucose, Bld: 117 mg/dL — ABNORMAL HIGH (ref 70–99)
Potassium: 3.1 mmol/L — ABNORMAL LOW (ref 3.5–5.1)
Sodium: 140 mmol/L (ref 135–145)

## 2022-10-15 LAB — CMP14+CBC/D/PLT+FER+RETIC+V...
Albumin: 4.5 g/dL (ref 4.1–5.1)
BUN/Creatinine Ratio: 7 — ABNORMAL LOW (ref 9–20)
BUN: 5 mg/dL — ABNORMAL LOW (ref 6–20)
Basophils Absolute: 0.1 10*3/uL (ref 0.0–0.2)
Basos: 1 %
Bilirubin Total: 3.7 mg/dL — ABNORMAL HIGH (ref 0.0–1.2)
CO2: 20 mmol/L (ref 20–29)
Calcium: 9.7 mg/dL (ref 8.7–10.2)
Creatinine, Ser: 0.67 mg/dL — ABNORMAL LOW (ref 0.76–1.27)
EOS (ABSOLUTE): 0.3 10*3/uL (ref 0.0–0.4)
Eos: 3 %
Ferritin: 3232 ng/mL — ABNORMAL HIGH (ref 30–400)
Glucose: 95 mg/dL (ref 70–99)
Immature Grans (Abs): 0.1 10*3/uL (ref 0.0–0.1)
Immature Granulocytes: 1 %
Lymphs: 23 %
MCV: 83 fL (ref 79–97)
Monocytes Absolute: 0.9 10*3/uL (ref 0.1–0.9)
Monocytes: 9 %
Neutrophils: 63 %
Platelets: 344 10*3/uL (ref 150–450)
RDW: 23.7 % — ABNORMAL HIGH (ref 11.6–15.4)
Retic Ct Pct: 7.9 % — ABNORMAL HIGH (ref 0.6–2.6)
Sodium: 142 mmol/L (ref 134–144)
WBC: 9.7 10*3/uL (ref 3.4–10.8)
eGFR: 127 mL/min/{1.73_m2} (ref >59)

## 2022-10-15 LAB — RETICULOCYTES
Immature Retic Fract: 30.4 % — ABNORMAL HIGH (ref 2.3–15.9)
RBC.: 2.64 MIL/uL — ABNORMAL LOW (ref 4.22–5.81)
Retic Count, Absolute: 510.5 10*3/uL — ABNORMAL HIGH (ref 19.0–186.0)
Retic Ct Pct: 20.4 % — ABNORMAL HIGH (ref 0.4–3.1)

## 2022-10-15 LAB — CBC WITH DIFFERENTIAL/PLATELET
Abs Immature Granulocytes: 0.02 10*3/uL (ref 0.00–0.07)
Basophils Absolute: 0.1 10*3/uL (ref 0.0–0.1)
Basophils Relative: 1 %
Eosinophils Absolute: 0.2 10*3/uL (ref 0.0–0.5)
Eosinophils Relative: 1 %
HCT: 26.3 % — ABNORMAL LOW (ref 39.0–52.0)
Hemoglobin: 9.2 g/dL — ABNORMAL LOW (ref 13.0–17.0)
Immature Granulocytes: 0 %
Lymphocytes Relative: 40 %
Lymphs Abs: 6.1 10*3/uL — ABNORMAL HIGH (ref 0.7–4.0)
MCH: 34.8 pg — ABNORMAL HIGH (ref 26.0–34.0)
MCHC: 35 g/dL (ref 30.0–36.0)
MCV: 99.6 fL (ref 80.0–100.0)
Monocytes Absolute: 1.7 10*3/uL — ABNORMAL HIGH (ref 0.1–1.0)
Monocytes Relative: 11 %
Neutro Abs: 7.1 10*3/uL (ref 1.7–7.7)
Neutrophils Relative %: 47 %
Platelets: 258 10*3/uL (ref 150–400)
RBC: 2.64 MIL/uL — ABNORMAL LOW (ref 4.22–5.81)
RDW: 23.2 % — ABNORMAL HIGH (ref 11.5–15.5)
WBC: 15.2 10*3/uL — ABNORMAL HIGH (ref 4.0–10.5)
nRBC: 1.7 % — ABNORMAL HIGH (ref 0.0–0.2)

## 2022-10-15 LAB — COMPREHENSIVE METABOLIC PANEL
ALT: 11 U/L (ref 0–44)
AST: 19 U/L (ref 15–41)
Albumin: 3.6 g/dL (ref 3.5–5.0)
Alkaline Phosphatase: 63 U/L (ref 38–126)
Anion gap: 7 (ref 5–15)
BUN: 6 mg/dL (ref 6–20)
CO2: 26 mmol/L (ref 22–32)
Calcium: 8.2 mg/dL — ABNORMAL LOW (ref 8.9–10.3)
Chloride: 107 mmol/L (ref 98–111)
Creatinine, Ser: 0.57 mg/dL — ABNORMAL LOW (ref 0.61–1.24)
GFR, Estimated: >60 mL/min (ref >60)
Glucose, Bld: 95 mg/dL (ref 70–99)
Potassium: 3.7 mmol/L (ref 3.5–5.1)
Sodium: 140 mmol/L (ref 135–145)
Total Bilirubin: 9.2 mg/dL — ABNORMAL HIGH (ref 0.3–1.2)
Total Protein: 8.1 g/dL (ref 6.5–8.1)

## 2022-10-15 LAB — DRUG SCREEN 764883 11+OXYCO+ALC+CRT-BUND

## 2022-10-15 MED ORDER — SODIUM CHLORIDE 0.9% FLUSH
10.0000 mL | INTRAVENOUS | Status: AC | PRN
  Administered 2022-10-15: 10 mL

## 2022-10-15 MED ORDER — HEPARIN SOD (PORK) LOCK FLUSH 100 UNIT/ML IV SOLN
500.0000 [IU] | Freq: Once | INTRAVENOUS | Status: AC
  Administered 2022-10-15: 500 [IU]
  Filled 2022-10-15: qty 5

## 2022-10-15 MED ORDER — HYDROMORPHONE 1 MG/ML IV SOLN
INTRAVENOUS | Status: DC
  Administered 2022-10-15: 11.5 mg via INTRAVENOUS
  Administered 2022-10-15: 30 mg via INTRAVENOUS
  Filled 2022-10-15: qty 30

## 2022-10-15 MED ORDER — DIPHENHYDRAMINE HCL 25 MG PO CAPS: 25.0000 mg | ORAL_CAPSULE | ORAL | Status: DC | PRN

## 2022-10-15 MED ORDER — HEPARIN SOD (PORK) LOCK FLUSH 100 UNIT/ML IV SOLN
500.0000 [IU] | INTRAVENOUS | Status: AC | PRN
  Administered 2022-10-15: 500 [IU]
  Filled 2022-10-15: qty 5

## 2022-10-15 MED ORDER — POTASSIUM CHLORIDE CRYS ER 20 MEQ PO TBCR
20.0000 meq | EXTENDED_RELEASE_TABLET | Freq: Once | ORAL | Status: AC
  Administered 2022-10-15: 20 meq via ORAL
  Filled 2022-10-15: qty 1

## 2022-10-15 MED ORDER — ACETAMINOPHEN 500 MG PO TABS
1000.0000 mg | ORAL_TABLET | Freq: Once | ORAL | Status: AC
  Administered 2022-10-15: 1000 mg via ORAL
  Filled 2022-10-15: qty 2

## 2022-10-15 MED ORDER — DIPHENHYDRAMINE HCL 25 MG PO CAPS
25.0000 mg | ORAL_CAPSULE | ORAL | Status: DC | PRN
  Administered 2022-10-15: 25 mg via ORAL
  Filled 2022-10-15: qty 1

## 2022-10-15 MED ORDER — SODIUM CHLORIDE 0.45 % IV SOLN: INTRAVENOUS | Status: DC

## 2022-10-15 MED ORDER — SODIUM CHLORIDE 0.9% FLUSH: 9.0000 mL | INTRAVENOUS | Status: DC | PRN

## 2022-10-15 MED ORDER — HYDROMORPHONE HCL 2 MG/ML IJ SOLN
2.0000 mg | INTRAMUSCULAR | Status: AC
  Administered 2022-10-15: 2 mg via INTRAVENOUS
  Filled 2022-10-15: qty 1

## 2022-10-15 MED ORDER — NALOXONE HCL 0.4 MG/ML IJ SOLN: 0.4000 mg | INTRAMUSCULAR | Status: DC | PRN

## 2022-10-15 MED ORDER — ONDANSETRON HCL 4 MG/2ML IJ SOLN: 4.0000 mg | Freq: Four times a day (QID) | INTRAMUSCULAR | Status: DC | PRN

## 2022-10-15 NOTE — Discharge Summary (Signed)
[facility] Discharge Summary   Patient ID: AMERICO VALLERY MRN: [id number] DOB/AGE: 1990-08-08 32 y.o.  Admit date: 10/15/2022 Discharge date: 10/15/2022  Primary Care Physician:  Massie Maroon, [name]  Admission Diagnoses:  Principal Problem:   Sickle cell pain crisis [facility])   Discharge Medications:  Allergies as of 10/15/2022       Reactions   Ketorolac Tromethamine Swelling, Other (See Comments)   Patient reports facial edema and left arm edema after administration.   Tape Rash, Other (See Comments)   PLEASE DO NOT USE THE CLEAR, THICK, "PLASTIC" TAPE- only paper tape is tolerated    Wound Dressing Adhesive Rash        Medication List     TAKE these medications    Deferasirox 90 MG Tabs Commonly known as: Jadenu Take 1 tablet by mouth daily at 12 noon.   Eliquis 5 MG Tabs tablet Generic drug: apixaban Take 1 tablet (5 mg total) by mouth 2 (two) times daily.   ergocalciferol 1.25 MG (50000 UT) capsule Commonly known as: VITAMIN D2 Take 1 capsule (50,000 Units total) by mouth once a week.   folic acid 1 MG tablet Commonly known as: FOLVITE Take 1 mg by mouth daily.   gabapentin 100 MG capsule Commonly known as: NEURONTIN Take 200 mg by mouth as needed (pain).   hydroxyurea 500 MG capsule Commonly known as: HYDREA Take 4 capsules (2,000 mg total) by mouth at bedtime.   oxyCODONE-acetaminophen 10-325 MG tablet Commonly known as: Percocet Take 1 tablet by mouth every 4 hours as needed for pain.   Triumeq 600-50-300 MG tablet Generic drug: abacavir-dolutegravir-lamiVUDine Take 1 tablet by mouth at bedtime.         Consults:  None  Significant Diagnostic Studies:  CT CHEST WO CONTRAST  Result Date: 09/25/2022 CLINICAL DATA:  Dyspnea, concern for sickle cell crisis EXAM: CT CHEST WITHOUT CONTRAST TECHNIQUE: Multidetector CT imaging of the chest was performed following the standard protocol without IV contrast. RADIATION DOSE  REDUCTION: This exam was performed according to the departmental dose-optimization program which includes automated exposure control, adjustment of the mA and/or kV according to patient size and/or use of iterative reconstruction technique. COMPARISON:  Chest CT dated Sep 15, 2022 FINDINGS: Cardiovascular: Mild cardiomegaly. No pericardial effusion. Normal caliber thoracic aorta no significant atherosclerotic disease. No coronary artery calcifications. Right chest wall port with tip in the SVC. Mediastinum/Nodes: Esophagus and thyroid are unremarkable. No enlarged lymph nodes seen in the chest. Lungs/Pleura: Central airways are patent. Bilateral mosaic attenuation. Redemonstrated patchy peribronchovascular predominant ground-glass opacities, new areas of ground-glass attenuation are seen in the upper lobes. Lower lung predominant scattered linear opacities, likely due to scarring. No pleural effusion or pneumothorax. Upper Abdomen: No acute abnormality. Musculoskeletal: No chest wall mass or suspicious bone lesions identified. IMPRESSION: 1. Redemonstrated patchy peribronchovascular predominant ground-glass opacities, new areas of ground-glass attenuation are seen in the upper lobes. Findings are nonspecific but can be seen in the setting of acute chest syndrome. 2. Bilateral mosaic attenuation, likely due to mosaic perfusion. 3. Unchanged bilateral lower lung predominant scarring. Electronically Signed   By: Allegra Lai M.D.   On: 09/25/2022 15:14   DG Chest 2 View  Result Date: 09/25/2022 CLINICAL DATA:  Sickle cell crisis and pain, initial encounter EXAM: CHEST - 2 VIEW COMPARISON:  09/14/2022 FINDINGS: Cardiac shadow is stable. Right chest wall port is again identified with a focal loop near the venous entry site. Diffuse interstitial changes are noted  in the lungs bilaterally. No sizable effusion is seen. No bony abnormality is noted. IMPRESSION: Mild interstitial changes likely representing edema.  Electronically Signed   By: Alcide Clever M.D.   On: 09/25/2022 03:17    History of present illness:  Martin [name] is a 32 year old male with a medical history significant for sickle cell disease type SS, chronic pain syndrome, opiate dependence and tolerance, history of PE on Eliquis, HIV disease, and anemia of chronic disease presented with allover body pain that is consistent with his previous sickle cell pain crisis.  Patient was treated and evaluated in the emergency department earlier this morning, pain was minimally relieved at discharge.  Currently, he rates pain at 9/10, constant, and aching.  [facility] Course: Patient admitted to sickle cell day infusion clinic for management of pain crisis. Reviewed labs, consistent with baseline. Pain managed with IV Dilaudid PCA, IV fluids, and Tylenol.  Pain intensity somewhat improved throughout admission. Patient requesting discharge home.  Patient advised to continue supplemental oxygen at 3 L.  He was evaluated by PCP on 10/13/2022 and received referrals to both pulmonology and infectious disease. Patient is alert, oriented, and ambulating without assistance he will discharge home in a hemodynamically stable condition.  Discharge instructions: Resume all home medications.   Follow up with PCP as previously  scheduled.   Discussed the importance of drinking 64 ounces of water daily, dehydration of red blood cells may lead further sickling.   Avoid all stressors that precipitate sickle cell pain crisis.     The patient was given clear instructions to go to ER or return to medical center if symptoms do not improve, worsen or new problems develop.   Physical Exam at Discharge:  BP 94/64 (BP Location: Right Arm)   Pulse 84   Temp 98.4 F (36.9 C) (Temporal)   Resp 16   SpO2 91%  Physical Exam Constitutional:      Appearance: Normal appearance.  Eyes:     Pupils: Pupils are equal, round, and reactive to light.   Cardiovascular:     Rate and Rhythm: Normal rate.  Pulmonary:     Effort: Pulmonary effort is normal.  Abdominal:     General: Bowel sounds are normal.  Skin:    General: Skin is warm.  Neurological:     General: No focal deficit present.     Mental Status: He is alert. Mental status is at baseline.  Psychiatric:        Mood and Affect: Mood normal.        Behavior: Behavior normal.        Thought Content: Thought content normal.        Judgment: Judgment normal.     Disposition at Discharge: Discharge disposition: 01-Home or Self Care       Discharge Orders: Discharge Instructions     Discharge patient   Complete by: As directed    Discharge disposition: 01-Home or Self Care   Discharge patient date: 10/15/2022       Condition at Discharge:   Stable  Time spent on Discharge:  Greater than 30 minutes.  Signed: [name]  APRN, [name], [name] [facility] [address] [place], [place] [address] [phone]  10/15/2022, 3:54 PM  Please note that dragon dictation software was used in the construction of this note.

## 2022-10-15 NOTE — ED Provider Notes (Signed)
[place] EMERGENCY DEPARTMENT AT [facility] Provider Note   CSN: [id number] Arrival date & time: 10/14/22  2338     History  Chief Complaint  Patient presents with   Sickle Cell Pain Crisis    Martin [name] is a 32 y.o. male.  With past medical history of HIV, sickle cell disease, PE who presents to the emergency department with sickle cell pain crisis.  Patient states that symptoms began this morning.  States that he is having pain all over, primarily in his low back however.  He states this is typical for his sickle cell pain crisis.  He has been using his home Percocets without relief of symptoms.  He denies having any changes to his vision or headache, chest pain, palpitations or shortness of breath, abdominal pain, nausea, vomiting, diarrhea, dysuria.  He has been eating and drinking appropriately.  No recent illnesses.   Sickle Cell Pain Crisis      Home Medications Prior to Admission medications   Medication Sig Start Date End Date Taking? Authorizing Provider  abacavir-dolutegravir-lamiVUDine (TRIUMEQ) 600-50-300 MG tablet Take 1 tablet by mouth at bedtime.   Yes [provider]  apixaban (ELIQUIS) 5 MG TABS tablet Take 1 tablet (5 mg total) by mouth 2 (two) times daily. 12/02/21  Yes Massie Maroon, [name]  Deferasirox (JADENU) 90 MG TABS Take 1 tablet by mouth daily at 12 noon. 10/14/22  Yes Massie Maroon, [name]  ergocalciferol (VITAMIN D2) 1.25 MG (50000 UT) capsule Take 1 capsule (50,000 Units total) by mouth once a week. 10/14/22  Yes Massie Maroon, [name]  folic acid (FOLVITE) 1 MG tablet Take 1 mg by mouth daily. 10/10/21  Yes [provider]  gabapentin (NEURONTIN) 100 MG capsule Take 200 mg by mouth as needed (pain). 11/04/21  Yes [provider]  hydroxyurea (HYDREA) 500 MG capsule Take 4 capsules (2,000 mg total) by mouth at bedtime. 10/07/20  Yes Massie Maroon, [name]  oxyCODONE-acetaminophen (PERCOCET) 10-325 MG  tablet Take 1 tablet by mouth every 4 hours as needed for pain. 10/12/22  Yes Massie Maroon, [name]      Allergies    Ketorolac tromethamine, Tape, and Wound dressing adhesive    Review of Systems   Review of Systems  Musculoskeletal:  Positive for arthralgias and back pain.  All other systems reviewed and are negative.   Physical Exam Updated Vital Signs BP 113/75   Pulse 67   Temp 98 F (36.7 C)   Resp 18   SpO2 92%  Physical Exam Vitals and nursing note reviewed.  Constitutional:      General: He is not in acute distress.    Appearance: Normal appearance. He is not ill-appearing or toxic-appearing.  HENT:     Head: Normocephalic.     Mouth/Throat:     Mouth: Mucous membranes are moist.     Pharynx: Oropharynx is clear.  Eyes:     General: No scleral icterus.    Extraocular Movements: Extraocular movements intact.     Pupils: Pupils are equal, round, and reactive to light.  Cardiovascular:     Rate and Rhythm: Normal rate and regular rhythm.     Pulses: Normal pulses.     Heart sounds: No murmur heard. Pulmonary:     Effort: Pulmonary effort is normal. No respiratory distress.     Breath sounds: Normal breath sounds.  Abdominal:     General: Bowel sounds are normal. There is no distension.  Palpations: Abdomen is soft.  Musculoskeletal:        General: Normal range of motion.     Cervical back: Normal range of motion and neck supple.  Skin:    General: Skin is warm and dry.     Capillary Refill: Capillary refill takes less than 2 seconds.  Neurological:     General: No focal deficit present.     Mental Status: He is alert and oriented to person, place, and time. Mental status is at baseline.  Psychiatric:        Mood and Affect: Mood normal.        Behavior: Behavior normal.     ED Results / Procedures / Treatments   Labs (all labs ordered are listed, but only abnormal results are displayed) Labs Reviewed  CBC WITH DIFFERENTIAL/PLATELET - Abnormal;  Notable for the following components:      Result Value   WBC 15.2 (*)    RBC 2.64 (*)    Hemoglobin 9.2 (*)    HCT 26.3 (*)    MCH 34.8 (*)    RDW 23.2 (*)    nRBC 1.7 (*)    Lymphs Abs 6.1 (*)    Monocytes Absolute 1.7 (*)    All other components within normal limits  RETICULOCYTES - Abnormal; Notable for the following components:   Retic Ct Pct 20.4 (*)    RBC. 2.64 (*)    Retic Count, Absolute 510.5 (*)    Immature Retic Fract 30.4 (*)    All other components within normal limits  BASIC METABOLIC PANEL - Abnormal; Notable for the following components:   Potassium 3.1 (*)    Glucose, Bld 117 (*)    Calcium 8.2 (*)    All other components within normal limits    EKG None  Radiology No results found.  Procedures Procedures   Medications Ordered in ED Medications  diphenhydrAMINE (BENADRYL) capsule 25-50 mg (25 mg Oral Given 10/15/22 0228)  HYDROmorphone (DILAUDID) injection 2 mg (2 mg Intravenous Given 10/15/22 0226)  HYDROmorphone (DILAUDID) injection 2 mg (2 mg Intravenous Given 10/15/22 0255)  HYDROmorphone (DILAUDID) injection 2 mg (2 mg Intravenous Given 10/15/22 0401)    ED Course/ Medical Decision Making/ A&P Clinical Course as of 10/15/22 0600  Thu Oct 15, 2022  0420 Pain improving  [LA]    Clinical Course User Index [LA] Cristopher Peru, PA-C    Medical Decision Making Amount and/or Complexity of Data Reviewed Labs: ordered.  Risk Prescription drug management.  Initial Impression and Ddx 32 year old male who presents to the emergency department with sickle cell pain crisis Patient PMH that increases complexity of ED encounter:  SCD, PE, HIV Differential: Acute sickle cell pain vaso-occlusive crisis, aplastic anemia, splenic sequestration, stroke, acute chest syndrome, pulmonary embolism, AVN, osteomyelitis, septic joint, ischemic bowel, pain crisis  Interpretation of Diagnostics I independent reviewed and interpreted the labs as followed: retic  count elevated to 20. Wbc elevated at 15, hemoglobin stable; bmp without significant derangement  - I independently visualized the following imaging with scope of interpretation limited to determining acute life threatening conditions related to emergency care: not indicated   Patient Reassessment and Ultimate Disposition/Management 32 year old male who presents to the emergency department with sickle cell pain crisis. Overall, well appearing and non-toxic in appearance. Hemodynamically stable without fever. Pain is consistent with his known pain crises. No chest pain, SOB, abdominal pain or stroke symptoms. Joints are wnl.   Ordered pain control and benadryl. He is tolerating  PO fluids without nausea and vomiting so will allow him to keep PO rather than IVF.  Labs without concerning findings. Do not feel he needs acute chest workup as he has no chest pain, palpitations or shortness of breath. Presentation also not consistent with PE.  No evidence on labs of an aplastic crisis.  No abdominal pain concerning for splenic sequestration or ischemic bowel.  No stroke symptoms.  No specific joint pain or swelling concerning for AVN or osteomyelitis or septic joint.  After reassessment after multiple doses of pain medication and Benadryl, having improvement in symptoms.  He feels like he can control his symptoms at home or follow-up in the day hospital.  Feel that this is appropriate.  Will discharge with return precautions.  The patient has been appropriately medically screened and/or stabilized in the ED. I have low suspicion for any other emergent medical condition which would require further screening, evaluation or treatment in the ED or require inpatient management. At time of discharge the patient is hemodynamically stable and in no acute distress. I have discussed work-up results and diagnosis with patient and answered all questions. Patient is agreeable with discharge plan. We discussed strict return  precautions for returning to the emergency department and they verbalized understanding.     Patient management required discussion with the following services or consulting groups:  None  Complexity of Problems Addressed Chronic illness with exacerbation  Additional Data Reviewed and Analyzed Further history obtained from: Past medical history and medications listed in the EMR, Prior ED visit notes, Recent discharge summary, Care Everywhere, and Prior labs/imaging results  Patient Encounter Risk Assessment SDOH impact on management, Use of parenteral controlled substances, and Consideration of hospitalization  Final Clinical Impression(s) / ED Diagnoses Final diagnoses:  Sickle cell pain crisis [facility])    Rx / DC Orders ED Discharge Orders     None         Cristopher Peru, PA-C 10/15/22 0600    Horton, Mayer Masker, MD 10/16/22 0630

## 2022-10-15 NOTE — Progress Notes (Signed)
Pt admitted to day hospital for sickle cell pain treatment. On arrival, pt rates 8/10 generalized pain. Pt received Dilaudid PCA and hydrated IV fluids via PAC. Pt also received PO Tylenol and 20 mEq Klor-Con. At discharge, pt rates pain 5/10. PAC de-accessed and flushed with 0.9% Sodium Chloride and Heparin, site covered with band-aid. Pt is alert, oriented, and ambulatory at discharge.

## 2022-10-15 NOTE — Discharge Instructions (Signed)
You were seen in the emergency department today for sickle cell pain crisis.  Please follow-up with Martin Hollis, NP with your sickle cell team.  Please return to emergency department for worsening symptoms.

## 2022-10-15 NOTE — Telephone Encounter (Signed)
Pt called day hospital wanting to come in today for sickle cell pain treatment. Pt reports generalized pain 8/10. Pt reports taking percocet 10 mg at 6 AM. Pt reports being treated in [facility] last night and being discharged around 5 am. Pt denies fever, chest pain, N/V/D, and abdominal pain. [place] Hollis, [name] notified. Per provider pt can come to the day hospital today. Patient notified and verbalized understanding. Pt states that his family will be his transportation today.

## 2022-10-16 ENCOUNTER — Other Ambulatory Visit: Payer: Self-pay

## 2022-10-16 ENCOUNTER — Telehealth: Payer: Self-pay

## 2022-10-16 NOTE — Telephone Encounter (Signed)
Deferasirox PA status needed. [facility] called  to confirm this information. They can be contacted at [phone].  Thank you. KH

## 2022-10-17 LAB — OXYCODONE/OXYMORPHONE, CONFIRM
OXYCODONE/OXYMORPH: POSITIVE — AB
OXYCODONE: 874 ng/mL
OXYCODONE: POSITIVE — AB
OXYMORPHONE (GC/MS): 1841 ng/mL
OXYMORPHONE: POSITIVE — AB

## 2022-10-17 LAB — DRUG SCREEN 764883 11+OXYCO+ALC+CRT-BUND
Amphetamines, Urine: NEGATIVE ng/mL
BENZODIAZ UR QL: NEGATIVE ng/mL
Cocaine (Metabolite): NEGATIVE ng/mL
Creatinine: 99.7 mg/dL (ref 20.0–300.0)
Ethanol: NEGATIVE %
Propoxyphene: NEGATIVE ng/mL

## 2022-10-17 LAB — OPIATES CONFIRMATION, URINE: Opiates: NEGATIVE ng/mL

## 2022-10-19 ENCOUNTER — Other Ambulatory Visit (HOSPITAL_COMMUNITY): Payer: Self-pay

## 2022-10-19 ENCOUNTER — Other Ambulatory Visit: Payer: Self-pay | Admitting: Family Medicine

## 2022-10-19 ENCOUNTER — Other Ambulatory Visit: Payer: Self-pay

## 2022-10-19 ENCOUNTER — Encounter: Payer: Self-pay | Admitting: Family Medicine

## 2022-10-19 MED ORDER — DEFERIPRONE 500 MG PO TABS
1.0000 | ORAL_TABLET | Freq: Every day | ORAL | 1 refills | Status: DC
Start: 2022-10-19 — End: 2023-08-13
  Filled 2022-10-19: qty 30, 30d supply, fill #0

## 2022-10-19 NOTE — H&P (Signed)
[facility] History and Physical   Date: 10/19/2022  Patient name: Martin [name] Medical record number: [id number] Date of birth: [birth year] Age: 32 y.o. Gender: male PCP: Massie Maroon, [name]  Attending physician: No att. 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 Patient was treated and evaluated in the emergency department earlier this morning, pain was minimally relieved at discharge. Currently, he rates his pain at 9/10 constant and aching. Patient denies headache, chest pain, shortness of breath, nausea, vomiting, or diarrhea.   Meds: No medications prior to admission.    Allergies: Ketorolac tromethamine, Tape, and Wound dressing adhesive Past Medical History:  Diagnosis Date   Anxiety    HIV (human immunodeficiency virus infection) (HCC)    Proteinuria    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HCC)    Vitamin D deficiency 10/2018   Past Surgical History:  Procedure Laterality Date   IR IMAGING GUIDED PORT INSERTION  08/29/2019   IR REMOVAL TUN ACCESS W/ PORT W/O FL MOD SED  08/30/2020   TEE WITHOUT CARDIOVERSION N/A 08/30/2020   Procedure: TRANSESOPHAGEAL ECHOCARDIOGRAM (TEE);  Surgeon: [name], MD;  Location: [facility] ENDOSCOPY;  Service: Cardiovascular;  Laterality: N/A;   Family History  Problem Relation Age of Onset   Sickle cell trait Mother    Sickle cell trait Father    Birth defects Maternal Grandmother    Birth defects Paternal Grandmother    Social History   Socioeconomic History   Marital status: Single    Spouse name: Not on file   Number of children: Not on file   Years of education:  Not on file   Highest education level: Not on file  Occupational History   Occupation: disabled  Tobacco Use   Smoking status: Never   Smokeless tobacco: Never  Vaping Use   Vaping Use: Never used  Substance and Sexual Activity   Alcohol use: No   Drug use: Yes    Types: Marijuana    Comment: 2x week   Sexual activity: Yes    Birth control/protection: Condom  Other Topics Concern   Not on file  Social History Narrative   Not on file   Social Determinants of Health   Financial Resource Strain: Not on file  Food Insecurity: No Food Insecurity (09/26/2022)   Hunger Vital Sign    Worried About Running Out of Food in the Last Year: Never true    Ran Out of Food in the Last Year: Never true  Transportation Needs: No Transportation Needs (09/26/2022)   PRAPARE - Administrator, Civil Service (Medical): No    Lack of Transportation (Non-Medical): No  Physical Activity: Not on file  Stress: Not on file  Social Connections: Not on file  Intimate Partner Violence: Not At Risk (09/26/2022)   Humiliation, Afraid, Rape, and Kick questionnaire    Fear of Current or Ex-Partner: No    Emotionally Abused: No    Physically Abused: No    Sexually Abused: No    Review of Systems  Constitutional: Negative.   HENT: Negative.    Eyes: Negative.   Respiratory: Negative.    Cardiovascular:  Negative.   Gastrointestinal: Negative.   Genitourinary: Negative.   Musculoskeletal:  Positive for back pain and joint pain.  Skin: Negative.   Neurological: Negative.   Psychiatric/Behavioral: Negative.       Physical Exam: Blood pressure 94/64, pulse 84, temperature 98.4 F (36.9 C), temperature source Temporal, resp. rate 16, SpO2 93 %. Physical Exam Constitutional:      Appearance: Normal appearance.  Eyes:     Pupils: Pupils are equal, round, and reactive to light.  Cardiovascular:     Rate and Rhythm: Normal rate and regular rhythm.  Pulmonary:     Effort: Pulmonary effort is  normal.  Abdominal:     General: Bowel sounds are normal.  Musculoskeletal:        General: Normal range of motion.  Skin:    General: Skin is warm.  Neurological:     Mental Status: He is alert. Mental status is at baseline.  Psychiatric:        Mood and Affect: Mood normal.        Behavior: Behavior normal.        Thought Content: Thought content normal.        Judgment: Judgment normal.      Lab results: No results found for this or any previous visit (from the past 24 hour(s)).  Imaging results:  No results found.   Assessment & Plan: Patient admitted to sickle cell day infusion center for management of pain crisis.  Patient is opiate tolerant Initiate IV dilaudid PCA IV fluids, 0.45% at 100 ml/hr Tylenol 1000 mg by mouth times one dose Review CBC with differential, complete metabolic panel, and reticulocytes as results become available Pain intensity will be reevaluated in context of functioning and relationship to baseline as care progresses If pain intensity remains elevated and/or sudden change in hemodynamic stability transition to inpatient services for higher level of care.      [name]  APRN, [name], [name] Patient Care [facility] [address] [place], [place] [address] [phone]  10/19/2022, 12:38 PM

## 2022-10-19 NOTE — Progress Notes (Signed)
Meds ordered this encounter  Medications   Deferiprone (FERRIPROX) 500 MG TABS    Sig: Take 1 tablet by mouth daily.    Dispense:  270 tablet    Refill:  1    Order Specific Question:   Supervising Provider    Answer:   Quentin Angst [[id number]]     [name]  APRN, [name], [name] Patient Care [facility] [address] [place], [place] [address] [phone]

## 2022-10-20 ENCOUNTER — Other Ambulatory Visit (HOSPITAL_COMMUNITY): Payer: Self-pay

## 2022-10-26 ENCOUNTER — Other Ambulatory Visit (HOSPITAL_COMMUNITY): Payer: Self-pay

## 2022-10-26 ENCOUNTER — Telehealth: Payer: Self-pay | Admitting: Pharmacy Technician

## 2022-10-26 NOTE — Telephone Encounter (Signed)
[facility] Patient Product/process development scientist completed.  Pt's ins says Pharmacy not contracted with [facility]. Looks like it's [place] Medicaid.  The patient is uninsured and will need patient assistance for medication.  We can complete the application and will need to meet with the patient for signatures and income documentation.

## 2022-10-28 ENCOUNTER — Other Ambulatory Visit (HOSPITAL_COMMUNITY): Payer: Self-pay

## 2022-10-29 ENCOUNTER — Ambulatory Visit: Payer: Medicaid Other

## 2022-10-29 ENCOUNTER — Ambulatory Visit: Payer: Self-pay | Admitting: Family

## 2022-10-29 ENCOUNTER — Ambulatory Visit: Payer: Self-pay | Admitting: Pharmacist

## 2022-10-29 NOTE — Progress Notes (Deleted)
Brief Narrative   Patient ID: Martin [name], male    DOB: 22-Jun-1990, 32 y.o.   MRN: [id number]    Subjective:    No chief complaint on file.   HPI:  Martin [name] is a 32 y.o. male with previous medical history of sickle cell disease, chronic pain, pulmonary embolism on Eliquis, HIV diease, on home oxygen therapy at 2L via nasal canula at baseline, and anemia of chronic disease presenting to transfer care of HIV disease.   Martin [name] was recently hospitalized from 09/25/22  to 10/06/22 with sickle cell pain crisis. Previously followed by ID provider in [place], [place] and had been off medication. CD4 count during hospitalization was 720. In his medication history he was taking Triumeq.    Allergies  Allergen Reactions   Ketorolac Tromethamine Swelling and Other (See Comments)    Patient reports facial edema and left arm edema after administration.   Tape Rash and Other (See Comments)    PLEASE DO NOT USE THE CLEAR, THICK, "PLASTIC" TAPE- only paper tape is tolerated    Wound Dressing Adhesive Rash      Outpatient Medications Prior to Visit  Medication Sig Dispense Refill   abacavir-dolutegravir-lamiVUDine (TRIUMEQ) 600-50-300 MG tablet Take 1 tablet by mouth at bedtime.     apixaban (ELIQUIS) 5 MG TABS tablet Take 1 tablet (5 mg total) by mouth 2 (two) times daily. 60 tablet 11   Deferiprone (FERRIPROX) 500 MG TABS Take 1 tablet by mouth daily. 270 tablet 1   ergocalciferol (VITAMIN D2) 1.25 MG (50000 UT) capsule Take 1 capsule (50,000 Units total) by mouth once a week. 12 capsule 3   folic acid (FOLVITE) 1 MG tablet Take 1 mg by mouth daily.     gabapentin (NEURONTIN) 100 MG capsule Take 200 mg by mouth as needed (pain).     hydroxyurea (HYDREA) 500 MG capsule Take 4 capsules (2,000 mg total) by mouth at bedtime. 120 capsule 11   oxyCODONE-acetaminophen (PERCOCET) 10-325 MG tablet Take 1 tablet by mouth every 4 hours as needed for pain. 90 tablet 0   No  facility-administered medications prior to visit.     Past Medical History:  Diagnosis Date   Anxiety    HIV (human immunodeficiency virus infection) (HCC)    Proteinuria    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HCC)    Vitamin D deficiency 10/2018     Past Surgical History:  Procedure Laterality Date   IR IMAGING GUIDED PORT INSERTION  08/29/2019   IR REMOVAL TUN ACCESS W/ PORT W/O FL MOD SED  08/30/2020   TEE WITHOUT CARDIOVERSION N/A 08/30/2020   Procedure: TRANSESOPHAGEAL ECHOCARDIOGRAM (TEE);  Surgeon: [name], MD;  Location: [facility] ENDOSCOPY;  Service: Cardiovascular;  Laterality: N/A;      Review of Systems    Objective:    There were no vitals taken for this visit. Nursing note and vital signs reviewed.  Physical Exam      10/13/2022   11:35 AM 03/31/2022   12:19 PM 12/02/2021   12:01 PM 03/18/2021   11:53 AM 03/26/2020    3:24 PM  Depression screen PHQ 2/9  Decreased Interest 1 0 0 0 0  Down, Depressed, Hopeless 1 0 0 0 0  PHQ - 2 Score 2 0 0 0 0  Altered sleeping 0      Tired, decreased energy 2      Change in appetite 0      Feeling bad or failure about yourself  0      Trouble concentrating 0      Moving slowly or fidgety/restless 0      Suicidal thoughts 0      PHQ-9 Score 4      Difficult doing work/chores Not difficult at all           Assessment & Plan:    Patient Active Problem List   Diagnosis Date Noted   Anemia of chronic disease 07/13/2022   Hyperbilirubinemia 07/13/2022   Tinea capitis 07/29/2021   Sickle cell crisis (HCC) 06/24/2021   Bacteremia due to Enterococcus 08/29/2020   Acute chest syndrome (HCC) 08/26/2020   Hypoxia 07/09/2020   COVID-19 05/13/2020   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/18/2020   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   Abnormal penile discharge, without blood 02/22/2020   Leukocytosis 01/02/2020   Anxiety 11/27/2019   Proteinuria 11/27/2019   Acute on chronic respiratory failure with hypoxia (HCC) 11/16/2019   Chronic,  continuous use of opioids 08/15/2019   Seasonal allergies 08/15/2019   Chest congestion    Sickle cell anemia with crisis (HCC) 08/04/2019   Chronic pain syndrome 08/04/2019   History of pulmonary embolus (PE) 08/04/2019   Single subsegmental pulmonary embolism without acute cor pulmonale (HCC) 02/10/2019   Acute chest syndrome due to sickle cell crisis (HCC) 02/10/2019   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) 03/09/2018   Bone pain 03/07/2018   Hip pain 03/07/2018   Acute bronchitis due to Streptococcus 02/21/2018   Heart murmur 02/03/2017   Sickle cell disease with crisis (HCC) 02/02/2017   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   No orders of the defined types were placed in this encounter.    Follow-up: No follow-ups on file.   Marcos Eke, [name], [name] Nurse Practitioner [facility] Main number: [phone]

## 2022-11-02 ENCOUNTER — Other Ambulatory Visit: Payer: Self-pay | Admitting: Family Medicine

## 2022-11-02 DIAGNOSIS — D571 Sickle-cell disease without crisis: Secondary | ICD-10-CM

## 2022-11-02 DIAGNOSIS — F1190 Opioid use, unspecified, uncomplicated: Secondary | ICD-10-CM

## 2022-11-02 MED ORDER — OXYCODONE-ACETAMINOPHEN 10-325 MG PO TABS
1.0000 | ORAL_TABLET | ORAL | 0 refills | Status: DC | PRN
Start: 2022-11-02 — End: 2022-11-16

## 2022-11-02 NOTE — Progress Notes (Signed)
Reviewed PDMP substance reporting system prior to prescribing opiate medications. No inconsistencies noted.  Meds ordered this encounter  Medications   oxyCODONE-acetaminophen (PERCOCET) 10-325 MG tablet    Sig: Take 1 tablet by mouth every 4 hours as needed for pain.    Dispense:  90 tablet    Refill:  0    Order Specific Question:   Supervising Provider    Answer:   JEGEDE, OLUGBEMIGA E [[id number]]   Martin Moore Hollis  APRN, [name], [name] [facility] [address]  [place], [place] [address] [phone]  

## 2022-11-02 NOTE — Telephone Encounter (Signed)
 Percocet

## 2022-11-16 ENCOUNTER — Telehealth: Payer: Self-pay | Admitting: Family Medicine

## 2022-11-16 ENCOUNTER — Other Ambulatory Visit (HOSPITAL_COMMUNITY): Payer: Self-pay

## 2022-11-16 ENCOUNTER — Other Ambulatory Visit: Payer: Self-pay | Admitting: Nurse Practitioner

## 2022-11-16 DIAGNOSIS — D571 Sickle-cell disease without crisis: Secondary | ICD-10-CM

## 2022-11-16 DIAGNOSIS — F1190 Opioid use, unspecified, uncomplicated: Secondary | ICD-10-CM

## 2022-11-16 MED ORDER — OXYCODONE-ACETAMINOPHEN 10-325 MG PO TABS
1.0000 | ORAL_TABLET | ORAL | 0 refills | Status: DC | PRN
Start: 2022-11-16 — End: 2022-11-30
  Filled 2022-11-16: qty 90, 15d supply, fill #0

## 2022-11-16 NOTE — Telephone Encounter (Signed)
Caller & Relationship to patient:  MRN #  [id number]   Call Back Number:   Date of Last Office Visit: 10/16/2022     Date of Next Office Visit: 01/12/2023    Medication(s) to be Refilled: Oxycodone   Preferred Pharmacy:   ** Please notify patient to allow 48-72 hours to process** **Let patient know to contact pharmacy at the end of the day to make sure medication is ready. ** **If patient has not been seen in a year or longer, book an appointment **Advise to use MyChart for refill requests OR to contact their pharmacy

## 2022-11-30 ENCOUNTER — Telehealth: Payer: Self-pay | Admitting: Family Medicine

## 2022-11-30 ENCOUNTER — Other Ambulatory Visit: Payer: Self-pay | Admitting: Family Medicine

## 2022-11-30 DIAGNOSIS — D571 Sickle-cell disease without crisis: Secondary | ICD-10-CM

## 2022-11-30 DIAGNOSIS — F1190 Opioid use, unspecified, uncomplicated: Secondary | ICD-10-CM

## 2022-11-30 MED ORDER — OXYCODONE-ACETAMINOPHEN 10-325 MG PO TABS
1.0000 | ORAL_TABLET | ORAL | 0 refills | Status: DC | PRN
Start: 2022-11-30 — End: 2022-12-23

## 2022-11-30 NOTE — Progress Notes (Signed)
Reviewed PDMP substance reporting system prior to prescribing opiate medications. No inconsistencies noted.  Meds ordered this encounter  Medications   oxyCODONE-acetaminophen (PERCOCET) 10-325 MG tablet    Sig: Take 1 tablet by mouth every 4 hours as needed for pain.    Dispense:  90 tablet    Refill:  0    Order Specific Question:   Supervising Provider    Answer:   JEGEDE, OLUGBEMIGA E [[id number]]   Martin Moore Hollis  APRN, [name], [name] [facility] [address]  [place], [place] [address] [phone]  

## 2022-11-30 NOTE — Telephone Encounter (Signed)
Caller & Relationship to patient:  MRN #  [id number]   Call Back Number:   Date of Last Office Visit: 11/16/2022     Date of Next Office Visit: 01/12/2023    Medication(s) to be Refilled: Oxycodone   Preferred Pharmacy: [facility] in [place]  [address] [place] [facility]  ** Please notify patient to allow 48-72 hours to process** **Let patient know to contact pharmacy at the end of the day to make sure medication is ready. ** **If patient has not been seen in a year or longer, book an appointment **Advise to use MyChart for refill requests OR to contact their pharmacy

## 2022-12-23 ENCOUNTER — Other Ambulatory Visit (HOSPITAL_COMMUNITY): Payer: Self-pay

## 2022-12-23 ENCOUNTER — Telehealth: Payer: Self-pay | Admitting: Family Medicine

## 2022-12-23 ENCOUNTER — Other Ambulatory Visit: Payer: Self-pay | Admitting: Family Medicine

## 2022-12-23 DIAGNOSIS — D571 Sickle-cell disease without crisis: Secondary | ICD-10-CM

## 2022-12-23 DIAGNOSIS — F1190 Opioid use, unspecified, uncomplicated: Secondary | ICD-10-CM

## 2022-12-23 MED ORDER — OXYCODONE-ACETAMINOPHEN 10-325 MG PO TABS
1.0000 | ORAL_TABLET | ORAL | 0 refills | Status: AC | PRN
Start: 2022-12-23 — End: ?
  Filled 2022-12-23 – 2023-01-01 (×2): qty 90, 15d supply, fill #0

## 2022-12-23 NOTE — Telephone Encounter (Signed)
Caller & Relationship to patient:  MRN #  [id number]   Call Back Number:   Date of Last Office Visit: 11/30/2022     Date of Next Office Visit: 01/12/2023    Medication(s) to be Refilled: percocet   Preferred Pharmacy: pt states he is back in [place] so you can send it to the [facility] or the [place] pharmacy- preferably [place]   ** Please notify patient to allow 48-72 hours to process** **Let patient know to contact pharmacy at the end of the day to make sure medication is ready. ** **If patient has not been seen in a year or longer, book an appointment **Advise to use MyChart for refill requests OR to contact their pharmacy

## 2022-12-23 NOTE — Progress Notes (Signed)
Reviewed PDMP substance reporting system prior to prescribing opiate medications. No inconsistencies noted.  Meds ordered this encounter  Medications   oxyCODONE-acetaminophen (PERCOCET) 10-325 MG tablet    Sig: Take 1 tablet by mouth every 4 hours as needed for pain.    Dispense:  90 tablet    Refill:  0    Order Specific Question:   Supervising Provider    Answer:   JEGEDE, OLUGBEMIGA E [[id number]]   Lachina Moore Hollis  APRN, [name], [name] [facility] [address]  [place], [place] [address] [phone]  

## 2022-12-24 ENCOUNTER — Other Ambulatory Visit (HOSPITAL_COMMUNITY): Payer: Self-pay

## 2023-01-01 ENCOUNTER — Other Ambulatory Visit (HOSPITAL_COMMUNITY): Payer: Self-pay

## 2023-01-12 ENCOUNTER — Encounter: Payer: Self-pay | Admitting: Family Medicine

## 2023-01-12 ENCOUNTER — Ambulatory Visit (INDEPENDENT_AMBULATORY_CARE_PROVIDER_SITE_OTHER): Payer: Self-pay | Admitting: Family Medicine

## 2023-01-12 ENCOUNTER — Other Ambulatory Visit (HOSPITAL_COMMUNITY): Payer: Self-pay

## 2023-01-12 ENCOUNTER — Non-Acute Institutional Stay (HOSPITAL_COMMUNITY)
Admission: RE | Admit: 2023-01-12 | Discharge: 2023-01-12 | Disposition: A | Discharge status: Home / Self Care | Payer: Self-pay | Source: Ambulatory Visit | Attending: Internal Medicine | Admitting: Internal Medicine

## 2023-01-12 VITALS — BP 115/74 | HR 100 | Temp 98.1°F | Resp 14 | Ht 75.0 in | Wt 140.0 lb

## 2023-01-12 DIAGNOSIS — R21 Rash and other nonspecific skin eruption: Secondary | ICD-10-CM

## 2023-01-12 DIAGNOSIS — B20 Human immunodeficiency virus [HIV] disease: Secondary | ICD-10-CM

## 2023-01-12 DIAGNOSIS — E559 Vitamin D deficiency, unspecified: Secondary | ICD-10-CM

## 2023-01-12 DIAGNOSIS — D5700 Hb-SS disease with crisis, unspecified: Secondary | ICD-10-CM | POA: Insufficient documentation

## 2023-01-12 MED ORDER — TRIAMCINOLONE ACETONIDE 0.1 % EX CREA
1.0000 | TOPICAL_CREAM | Freq: Two times a day (BID) | CUTANEOUS | 2 refills | Status: DC
Start: 2023-01-12 — End: 2024-03-04
  Filled 2023-01-12: qty 30, 15d supply, fill #0

## 2023-01-12 MED ORDER — SODIUM CHLORIDE 0.9% FLUSH
10.0000 mL | INTRAVENOUS | Status: AC | PRN
  Administered 2023-01-12: 10 mL

## 2023-01-12 MED ORDER — HEPARIN SOD (PORK) LOCK FLUSH 100 UNIT/ML IV SOLN
500.0000 [IU] | INTRAVENOUS | Status: AC | PRN
  Administered 2023-01-12: 500 [IU]
  Filled 2023-01-12: qty 5

## 2023-01-12 NOTE — Progress Notes (Signed)
Pt came over from Primary Care appointment to have his labs drawn via PAC. Pts right sided PAC accessed using sterile technique, blood return showed and labs drawn. PAC flushed with 0.9% Sodium Chloride and Heparin, and de-accessed. Lab tubes given back to phlebotomist Taft on primary care side. Pt is alert, oriented, and ambulatory at discharge.

## 2023-01-12 NOTE — Progress Notes (Signed)
Established Patient Office Visit  Subjective   Patient ID: Martin [name], male    DOB: [birth year]  Age: 33 y.o. MRN: [id number]  Chief Complaint  Patient presents with   Follow-up    Martin [name] is a 32 year old male with a medical history significant for sickle cell disease, chronic pain syndrome, opiate dependence and tolerance, history of HIV, anemia of chronic disease, and history of pulmonary embolism on Eliquis presents for a follow up of chronic conditions.  Patient says that he has been doing well and is without complaint. He says that chronic pain is primarily to his lower back. Patient last had Percocet on last night with maximum relief.    Rash This is a new problem. The current episode started 1 to 4 weeks ago. The affected locations include the left wrist and left buttock. Pertinent negatives include no fever. The treatment provided no relief. There is no history of allergies, eczema or varicella.    Patient Active Problem List   Diagnosis Date Noted   Anemia of chronic disease 07/13/2022   Hyperbilirubinemia 07/13/2022   Tinea capitis 07/29/2021   Sickle cell crisis (HCC) 06/24/2021   Bacteremia due to Enterococcus 08/29/2020   Acute chest syndrome (HCC) 08/26/2020   Hypoxia 07/09/2020   COVID-19 05/13/2020   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/18/2020   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   Abnormal penile discharge, without blood 02/22/2020   Leukocytosis 01/02/2020   Anxiety 11/27/2019   Proteinuria 11/27/2019   Acute on chronic respiratory failure with hypoxia (HCC) 11/16/2019   Chronic, continuous use of opioids 08/15/2019   Seasonal allergies 08/15/2019   Chest congestion    Sickle cell anemia with crisis (HCC) 08/04/2019   Chronic pain syndrome 08/04/2019   History of pulmonary embolus (PE) 08/04/2019   Single subsegmental pulmonary embolism without acute cor pulmonale (HCC) 02/10/2019   Acute chest syndrome due to sickle cell crisis (HCC)  02/10/2019   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) 03/09/2018   Bone pain 03/07/2018   Hip pain 03/07/2018   Acute bronchitis due to Streptococcus 02/21/2018   Heart murmur 02/03/2017   Sickle cell disease with crisis (HCC) 02/02/2017   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   Vitamin D deficiency 08/14/2016   Sickle cell pain crisis (HCC) 02/12/2016   High risk medication use 09/27/2014   Generalized anxiety disorder 05/19/2014   Gastro-esophageal reflux disease without esophagitis 05/19/2014   Marijuana use 11/07/2012   Human immunodeficiency virus (HIV) disease (HCC) 05/23/2012   Past Medical History:  Diagnosis Date   Anxiety    HIV (human immunodeficiency virus infection) (HCC)    Proteinuria    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HCC)    Vitamin D deficiency 10/2018   Past Surgical History:  Procedure Laterality Date   IR IMAGING GUIDED PORT INSERTION  08/29/2019   IR REMOVAL TUN ACCESS W/ PORT W/O FL MOD SED  08/30/2020   TEE WITHOUT CARDIOVERSION N/A 08/30/2020   Procedure: TRANSESOPHAGEAL ECHOCARDIOGRAM (TEE);  Surgeon: [name], MD;  Location: [facility] ENDOSCOPY;  Service: Cardiovascular;  Laterality: N/A;   Social History   Tobacco Use   Smoking status: Never   Smokeless tobacco: Never  Vaping Use   Vaping status: Never Used  Substance Use Topics   Alcohol use: No   Drug use: Yes    Types: Marijuana    Comment: 2x week   Social History   Socioeconomic History  Marital status: Single    Spouse name: Not on file   Number of children: Not on file   Years of education: Not on file   Highest education level: 12th grade  Occupational History   Occupation: disabled  Tobacco Use   Smoking status: Never   Smokeless tobacco: Never  Vaping Use   Vaping status: Never Used  Substance and Sexual Activity   Alcohol use: No   Drug use: Yes    Types: Marijuana    Comment: 2x week   Sexual activity: Yes    Birth  control/protection: Condom  Other Topics Concern   Not on file  Social History Narrative   Not on file   Social Determinants of Health   Financial Resource Strain: Medium Risk (01/11/2023)   Overall Financial Resource Strain (CARDIA)    Difficulty of Paying Living Expenses: Somewhat hard  Food Insecurity: Food Insecurity Present (01/11/2023)   Hunger Vital Sign    Worried About Running Out of Food in the Last Year: Sometimes true    Ran Out of Food in the Last Year: Sometimes true  Transportation Needs: Unmet Transportation Needs (01/11/2023)   PRAPARE - Transportation    Lack of Transportation (Medical): Yes    Lack of Transportation (Non-Medical): Yes  Physical Activity: Insufficiently Active (01/11/2023)   Exercise Vital Sign    Days of Exercise per Week: 3 days    Minutes of Exercise per Session: 10 min  Stress: No Stress Concern Present (01/11/2023)   [employer] of Occupational Health - Occupational Stress Questionnaire    Feeling of Stress : Only a little  Social Connections: Moderately Isolated (01/11/2023)   Social Connection and Isolation Panel [NHANES]    Frequency of Communication with Friends and Family: More than three times a week    Frequency of Social Gatherings with Friends and Family: Once a week    Attends Religious Services: 1 to 4 times per year    Active Member of [employer] or Organizations: No    Attends Banker Meetings: Not on file    Marital Status: Never married  Intimate Partner Violence: Not At Risk (09/26/2022)   Humiliation, Afraid, Rape, and Kick questionnaire    Fear of Current or Ex-Partner: No    Emotionally Abused: No    Physically Abused: No    Sexually Abused: No   Family Status  Relation Name Status   Mother  Alive   Father  Alive   Sister  Alive   Brother  Alive   MGM  Deceased   MGF  Deceased   PGM  Deceased   PGF  Deceased   Brother  Alive   Brother  Alive   Brother  Alive   Brother  Alive   Brother  Alive    Brother  Alive  No partnership data on file   Family History  Problem Relation Age of Onset   Sickle cell trait Mother    Sickle cell trait Father    Birth defects Maternal Grandmother    Birth defects Paternal Grandmother    Allergies  Allergen Reactions   Ketorolac Tromethamine Swelling and Other (See Comments)    Patient reports facial edema and left arm edema after administration.   Tape Rash and Other (See Comments)    PLEASE DO NOT USE THE CLEAR, THICK, "PLASTIC" TAPE- only paper tape is tolerated    Wound Dressing Adhesive Rash      Review of Systems  Constitutional: Negative.  Negative  for chills and fever.  HENT: Negative.    Respiratory: Negative.    Cardiovascular: Negative.   Gastrointestinal: Negative.   Genitourinary: Negative.   Musculoskeletal:  Positive for back pain.  Skin:  Positive for rash.  Neurological: Negative.   Psychiatric/Behavioral: Negative.        Objective:     BP 115/74 (BP Location: Right Arm, Patient Position: Sitting, Cuff Size: Normal)   Pulse 100   Temp 98.1 F (36.7 C)   Resp 14   Ht 6\' 3"  (1.905 m)   Wt 140 lb (63.5 kg)   SpO2 95%   BMI 17.50 kg/m  BP Readings from Last 3 Encounters:  01/12/23 115/74  10/15/22 94/64  10/15/22 113/75   Wt Readings from Last 3 Encounters:  01/12/23 140 lb (63.5 kg)  10/13/22 145 lb 3.2 oz (65.9 kg)  09/25/22 148 lb 9.4 oz (67.4 kg)      Physical Exam Constitutional:      Appearance: Normal appearance.  Eyes:     Pupils: Pupils are equal, round, and reactive to light.  Cardiovascular:     Rate and Rhythm: Normal rate and regular rhythm.  Pulmonary:     Effort: Pulmonary effort is normal.  Abdominal:     General: Bowel sounds are normal.  Musculoskeletal:        General: Normal range of motion.  Skin:    General: Skin is warm.     Findings: Rash present. Rash is crusting and papular.  Neurological:     General: No focal deficit present.     Mental Status: He is alert.  Mental status is at baseline.  Psychiatric:        Mood and Affect: Mood normal.        Behavior: Behavior normal.        Thought Content: Thought content normal.        Judgment: Judgment normal.      No results found for any visits on 01/12/23.  Last CBC Lab Results  Component Value Date   WBC 15.2 (H) 10/15/2022   HGB 9.2 (L) 10/15/2022   HCT 26.3 (L) 10/15/2022   MCV 99.6 10/15/2022   MCH 34.8 (H) 10/15/2022   RDW 23.2 (H) 10/15/2022   PLT 258 10/15/2022   Last metabolic panel Lab Results  Component Value Date   GLUCOSE 95 10/15/2022   NA 140 10/15/2022   K 3.7 10/15/2022   CL 107 10/15/2022   CO2 26 10/15/2022   BUN 6 10/15/2022   CREATININE 0.57 (L) 10/15/2022   GFRNONAA >60 10/15/2022   CALCIUM 8.2 (L) 10/15/2022   PHOS 5.6 (H) 06/28/2018   PROT 8.1 10/15/2022   ALBUMIN 3.6 10/15/2022   LABGLOB 2.9 10/13/2022   AGRATIO 1.0 (L) 03/31/2022   BILITOT 9.2 (H) 10/15/2022   ALKPHOS 63 10/15/2022   AST 19 10/15/2022   ALT 11 10/15/2022   ANIONGAP 7 10/15/2022   Last lipids No results found for: "CHOL", "HDL", "LDLCALC", "LDLDIRECT", "[name]", "CHOLHDL" Last hemoglobin A1c Lab Results  Component Value Date   HGBA1C (L) 05/22/2009    <4.0 (NOTE) The ADA recommends the following therapeutic goal for glycemic control related to Hgb A1c measurement: Goal of therapy: <6.5 Hgb A1c  Reference: American Diabetes Association: Clinical Practice Recommendations 2010, Diabetes Care, 2010, 33: (Suppl  1).   Last thyroid functions No results found for: "TSH", "T3TOTAL", "T4TOTAL", "THYROIDAB" Last vitamin D Lab Results  Component Value Date   VD25OH 14.8 (L) 10/13/2022  Last vitamin B12 and Folate Lab Results  Component Value Date   VITAMINB12 238 02/02/2017   FOLATE 10.5 02/02/2017      The ASCVD Risk score (Arnett DK, et al., 2019) failed to calculate for the following reasons:   The 2019 ASCVD risk score is only valid for ages 75 to 35    Assessment &  Plan:   Problem List Items Addressed This Visit       Other   Human immunodeficiency virus (HIV) disease (HCC) (Chronic)   Relevant Orders   T-helper cells CD4/CD8 %   Vitamin D deficiency   Sickle cell anemia with pain (HCC)   Relevant Orders   Sickle Cell Panel   Hb-SS disease without crisis (HCC) - Primary   Other Visit Diagnoses     Generalized papular rash       Relevant Medications   triamcinolone cream (KENALOG) 0.1 %     1. Sickle cell anemia with pain (HCC) No medication changes warranted. Discussed the importance of taking medications consistently in order to achieve positive outcomes.  - Sickle Cell Panel  2. Vitamin D deficiency  - Sickle Cell Panel  3. Generalized papular rash  - triamcinolone cream (KENALOG) 0.1 %; Apply 1 Application topically 2 (two) times daily.  Dispense: 30 g; Refill: 2  4. Human immunodeficiency virus (HIV) disease (HCC) Follow up with ID as scheduled. - T-helper cells CD4/CD8 %   Return in about 3 months (around 04/13/2023) for sickle cell anemia.   [name]  APRN, [name], [name] Patient Care [facility] [address] [place], [place] [address] [phone]

## 2023-01-13 LAB — CMP14+CBC/D/PLT+FER+RETIC+V...
ALT: 17 IU/L (ref 0–44)
AST: 22 IU/L (ref 0–40)
Albumin: 4.1 g/dL (ref 4.1–5.1)
Alkaline Phosphatase: 86 IU/L (ref 44–121)
BUN/Creatinine Ratio: 10 (ref 9–20)
BUN: 8 mg/dL (ref 6–20)
Basophils Absolute: 0.1 10*3/uL (ref 0.0–0.2)
Basos: 1 %
Bilirubin Total: 2.6 mg/dL — ABNORMAL HIGH (ref 0.0–1.2)
CO2: 20 mmol/L (ref 20–29)
Calcium: 9.2 mg/dL (ref 8.7–10.2)
Chloride: 106 mmol/L (ref 96–106)
Creatinine, Ser: 0.81 mg/dL (ref 0.76–1.27)
EOS (ABSOLUTE): 0.2 10*3/uL (ref 0.0–0.4)
Eos: 2 %
Ferritin: 3082 ng/mL — ABNORMAL HIGH (ref 30–400)
Globulin, Total: 3.8 g/dL (ref 1.5–4.5)
Glucose: 96 mg/dL (ref 70–99)
Hematocrit: 21.7 % — ABNORMAL LOW (ref 37.5–51.0)
Hemoglobin: 7.4 g/dL — ABNORMAL LOW (ref 13.0–17.7)
Immature Grans (Abs): 0.1 10*3/uL (ref 0.0–0.1)
Immature Granulocytes: 1 %
Lymphocytes Absolute: 5.6 10*3/uL — ABNORMAL HIGH (ref 0.7–3.1)
Lymphs: 51 %
MCH: 32.2 pg (ref 26.6–33.0)
MCHC: 34.1 g/dL (ref 31.5–35.7)
MCV: 94 fL (ref 79–97)
Monocytes Absolute: 1.1 10*3/uL — ABNORMAL HIGH (ref 0.1–0.9)
Monocytes: 10 %
NRBC: 3 % — ABNORMAL HIGH (ref 0–0)
Neutrophils Absolute: 3.8 10*3/uL (ref 1.4–7.0)
Neutrophils: 35 %
Platelets: 321 10*3/uL (ref 150–450)
Potassium: 4.3 mmol/L (ref 3.5–5.2)
RBC: 2.3 x10E6/uL — CL (ref 4.14–5.80)
RDW: 18.8 % — ABNORMAL HIGH (ref 11.6–15.4)
Retic Ct Pct: 10.9 % — ABNORMAL HIGH (ref 0.6–2.6)
Sodium: 140 mmol/L (ref 134–144)
Total Protein: 7.9 g/dL (ref 6.0–8.5)
Vit D, 25-Hydroxy: 17.2 ng/mL — ABNORMAL LOW (ref 30.0–100.0)
WBC: 10.8 10*3/uL (ref 3.4–10.8)
eGFR: 120 mL/min/{1.73_m2} (ref >59)

## 2023-01-15 ENCOUNTER — Other Ambulatory Visit: Payer: Self-pay | Admitting: Family Medicine

## 2023-01-15 ENCOUNTER — Other Ambulatory Visit (HOSPITAL_COMMUNITY): Payer: Self-pay

## 2023-01-15 DIAGNOSIS — D571 Sickle-cell disease without crisis: Secondary | ICD-10-CM

## 2023-01-15 DIAGNOSIS — F1190 Opioid use, unspecified, uncomplicated: Secondary | ICD-10-CM

## 2023-01-15 MED ORDER — OXYCODONE-ACETAMINOPHEN 10-325 MG PO TABS
1.0000 | ORAL_TABLET | ORAL | 0 refills | Status: DC | PRN
Start: 2023-01-15 — End: 2023-01-28

## 2023-01-15 NOTE — Telephone Encounter (Signed)
Caller & Relationship to patient:  MRN #  [id number]   Call Back Number:   Date of Last Office Visit: 12/23/2022     Date of Next Office Visit: 01/12/2023    Medication(s) to be Refilled: Oxycodone   Preferred Pharmacy:   ** Please notify patient to allow 48-72 hours to process** **Let patient know to contact pharmacy at the end of the day to make sure medication is ready. ** **If patient has not been seen in a year or longer, book an appointment **Advise to use MyChart for refill requests OR to contact their pharmacy

## 2023-01-15 NOTE — Progress Notes (Signed)
Reviewed PDMP substance reporting system prior to prescribing opiate medications. No inconsistencies noted.  Meds ordered this encounter  Medications   oxyCODONE-acetaminophen (PERCOCET) 10-325 MG tablet    Sig: Take 1 tablet by mouth every 4 hours as needed for pain.    Dispense:  90 tablet    Refill:  0    Order Specific Question:   Supervising Provider    Answer:   JEGEDE, OLUGBEMIGA E [[id number]]   Martin [name] Shivank Pinedo  APRN, [name], [name] [facility] [address]  [place], [place] [address] [phone]  

## 2023-01-16 ENCOUNTER — Encounter (HOSPITAL_COMMUNITY): Payer: Self-pay

## 2023-01-16 ENCOUNTER — Other Ambulatory Visit: Payer: Self-pay

## 2023-01-16 ENCOUNTER — Emergency Department (HOSPITAL_COMMUNITY)
Admission: EM | Admit: 2023-01-16 | Discharge: 2023-01-16 | Disposition: A | Discharge status: Home / Self Care | Payer: Self-pay | Attending: Emergency Medicine | Admitting: Emergency Medicine

## 2023-01-16 DIAGNOSIS — D5700 Hb-SS disease with crisis, unspecified: Secondary | ICD-10-CM

## 2023-01-16 DIAGNOSIS — D57219 Sickle-cell/Hb-C disease with crisis, unspecified: Secondary | ICD-10-CM | POA: Insufficient documentation

## 2023-01-16 DIAGNOSIS — Z21 Asymptomatic human immunodeficiency virus [HIV] infection status: Secondary | ICD-10-CM | POA: Insufficient documentation

## 2023-01-16 DIAGNOSIS — Z7901 Long term (current) use of anticoagulants: Secondary | ICD-10-CM | POA: Insufficient documentation

## 2023-01-16 LAB — CBC WITH DIFFERENTIAL/PLATELET
Abs Immature Granulocytes: 0 10*3/uL (ref 0.00–0.07)
Band Neutrophils: 0 %
Basophils Absolute: 0 10*3/uL (ref 0.0–0.1)
Basophils Relative: 0 %
Blasts: 0 %
Eosinophils Absolute: 0.3 10*3/uL (ref 0.0–0.5)
Eosinophils Relative: 3 %
HCT: 26.3 % — ABNORMAL LOW (ref 39.0–52.0)
Hemoglobin: 9 g/dL — ABNORMAL LOW (ref 13.0–17.0)
Lymphocytes Relative: 54 %
Lymphs Abs: 4.8 10*3/uL — ABNORMAL HIGH (ref 0.7–4.0)
MCH: 33.5 pg (ref 26.0–34.0)
MCHC: 34.2 g/dL (ref 30.0–36.0)
MCV: 97.8 fL (ref 80.0–100.0)
Metamyelocytes Relative: 0 %
Monocytes Absolute: 1.4 10*3/uL — ABNORMAL HIGH (ref 0.1–1.0)
Monocytes Relative: 16 %
Myelocytes: 0 %
Neutro Abs: 2.4 10*3/uL (ref 1.7–7.7)
Neutrophils Relative %: 27 %
Other: 0 %
Platelets: 486 10*3/uL — ABNORMAL HIGH (ref 150–400)
Promyelocytes Relative: 0 %
RBC: 2.69 MIL/uL — ABNORMAL LOW (ref 4.22–5.81)
RDW: 23.9 % — ABNORMAL HIGH (ref 11.5–15.5)
WBC: 8.9 10*3/uL (ref 4.0–10.5)
nRBC: 0 /100{WBCs}
nRBC: 14.6 % — ABNORMAL HIGH (ref 0.0–0.2)

## 2023-01-16 LAB — COMPREHENSIVE METABOLIC PANEL
ALT: 15 U/L (ref 0–44)
AST: 19 U/L (ref 15–41)
Albumin: 3.5 g/dL (ref 3.5–5.0)
Alkaline Phosphatase: 69 U/L (ref 38–126)
Anion gap: 10 (ref 5–15)
BUN: 10 mg/dL (ref 6–20)
CO2: 25 mmol/L (ref 22–32)
Calcium: 8.6 mg/dL — ABNORMAL LOW (ref 8.9–10.3)
Chloride: 109 mmol/L (ref 98–111)
Creatinine, Ser: 0.66 mg/dL (ref 0.61–1.24)
GFR, Estimated: >60 mL/min (ref >60)
Glucose, Bld: 98 mg/dL (ref 70–99)
Potassium: 3.4 mmol/L — ABNORMAL LOW (ref 3.5–5.1)
Sodium: 144 mmol/L (ref 135–145)
Total Bilirubin: 4.6 mg/dL — ABNORMAL HIGH (ref 0.3–1.2)
Total Protein: 7.4 g/dL (ref 6.5–8.1)

## 2023-01-16 LAB — RETICULOCYTES
Immature Retic Fract: 33 % — ABNORMAL HIGH (ref 2.3–15.9)
RBC.: 2.65 MIL/uL — ABNORMAL LOW (ref 4.22–5.81)
Retic Count, Absolute: 391.5 10*3/uL — ABNORMAL HIGH (ref 19.0–186.0)
Retic Ct Pct: 14.8 % — ABNORMAL HIGH (ref 0.4–3.1)

## 2023-01-16 MED ORDER — HYDROMORPHONE HCL 2 MG/ML IJ SOLN
2.0000 mg | INTRAMUSCULAR | Status: AC
  Administered 2023-01-16: 2 mg via INTRAVENOUS
  Filled 2023-01-16: qty 1

## 2023-01-16 MED ORDER — HYDROMORPHONE HCL 2 MG/ML IJ SOLN
2.0000 mg | Freq: Once | INTRAMUSCULAR | Status: AC
  Administered 2023-01-16: 2 mg via INTRAVENOUS
  Filled 2023-01-16: qty 1

## 2023-01-16 MED ORDER — HEPARIN SOD (PORK) LOCK FLUSH 100 UNIT/ML IV SOLN
500.0000 [IU] | Freq: Once | INTRAVENOUS | Status: AC
  Administered 2023-01-16: 500 [IU]
  Filled 2023-01-16: qty 5

## 2023-01-16 MED ORDER — ONDANSETRON HCL 4 MG/2ML IJ SOLN
4.0000 mg | INTRAMUSCULAR | Status: DC | PRN
  Administered 2023-01-16: 4 mg via INTRAVENOUS
  Filled 2023-01-16: qty 2

## 2023-01-16 MED ORDER — DIPHENHYDRAMINE HCL 25 MG PO CAPS
25.0000 mg | ORAL_CAPSULE | ORAL | Status: DC | PRN
  Administered 2023-01-16: 25 mg via ORAL
  Filled 2023-01-16: qty 1

## 2023-01-16 MED ORDER — SODIUM CHLORIDE 0.9 % IV BOLUS
1000.0000 mL | Freq: Once | INTRAVENOUS | Status: AC
  Administered 2023-01-16: 1000 mL via INTRAVENOUS

## 2023-01-16 NOTE — ED Triage Notes (Signed)
Pt states that he has been in crisis all day with everything hurting. Pt has taken his pain medication with little to no relief.

## 2023-01-16 NOTE — ED Notes (Addendum)
RN aware of PT 02 stat

## 2023-01-16 NOTE — Discharge Instructions (Signed)
Glad you are feeling better. Continue your usual medications.  Try to keep pushing oral fluids for the next 24-48 hours or so. Follow-up in sickle cell clinic if needed. Return here for any new/acute changes.

## 2023-01-16 NOTE — ED Provider Notes (Signed)
[place] EMERGENCY DEPARTMENT AT [facility] Provider Note   CSN: [id number] Arrival date & time: 01/16/23  0255     History  Chief Complaint  Patient presents with   Sickle Cell Pain Crisis    Martin [name] is a 32 y.o. male.  The history is provided by the patient and medical records.  Sickle Cell Pain Crisis  32 year old male with history of sickle cell disease, HIV, history of PE on Eliquis, presenting to the ED with pain crisis.  States he is having a lot of generalized pain, mostly in the back.  He denies any shortness of breath, chest pain, cough, or fever.  He thinks crisis may have been brought on due to some dehydration as he has been having diarrhea for the past few days after eating some food that he thinks went bad.  He has not had any vomiting.  He denies any nausea or any abdominal pain.  Of note, sats 89% upon arrival.  States that is typical for him during crises.  He adamantly denies any shortness of breath.  He has been compliant with his Eliquis.  Home Medications Prior to Admission medications   Medication Sig Start Date End Date Taking? Authorizing Provider  abacavir-dolutegravir-lamiVUDine (TRIUMEQ) 600-50-300 MG tablet Take 1 tablet by mouth at bedtime.    [provider]  apixaban (ELIQUIS) 5 MG TABS tablet Take 1 tablet (5 mg total) by mouth 2 (two) times daily. 12/02/21   Massie Maroon, [name]  Deferiprone (FERRIPROX) 500 MG TABS Take 1 tablet by mouth daily. 10/19/22   Massie Maroon, [name]  ergocalciferol (VITAMIN D2) 1.25 MG (50000 UT) capsule Take 1 capsule (50,000 Units total) by mouth once a week. 10/14/22   Massie Maroon, [name]  folic acid (FOLVITE) 1 MG tablet Take 1 mg by mouth daily. 10/10/21   [provider]  gabapentin (NEURONTIN) 100 MG capsule Take 200 mg by mouth as needed (pain). 11/04/21   [provider]  hydroxyurea (HYDREA) 500 MG capsule Take 4 capsules (2,000 mg total) by mouth at bedtime.  10/07/20   Massie Maroon, [name]  oxyCODONE-acetaminophen (PERCOCET) 10-325 MG tablet Take 1 tablet by mouth every 4 hours as needed for pain. 01/15/23   Massie Maroon, [name]  triamcinolone cream (KENALOG) 0.1 % Apply 1 Application topically 2 (two) times daily. 01/12/23   Massie Maroon, [name]      Allergies    Ketorolac tromethamine, Tape, and Wound dressing adhesive    Review of Systems   Review of Systems  Gastrointestinal:  Positive for diarrhea.  Musculoskeletal:  Positive for arthralgias.  All other systems reviewed and are negative.   Physical Exam Updated Vital Signs BP 98/70   Pulse 86   Temp 98.5 F (36.9 C) (Oral)   Resp 14   SpO2 (!) 89%   Physical Exam Vitals and nursing note reviewed.  Constitutional:      Appearance: He is well-developed.  HENT:     Head: Normocephalic and atraumatic.  Eyes:     Conjunctiva/sclera: Conjunctivae normal.     Pupils: Pupils are equal, round, and reactive to light.  Cardiovascular:     Rate and Rhythm: Normal rate and regular rhythm.     Heart sounds: Normal heart sounds.  Pulmonary:     Effort: Pulmonary effort is normal.     Breath sounds: Normal breath sounds.     Comments: Lung sounds are clear, no distress, able to speak in  full sentences without difficulty Abdominal:     General: Bowel sounds are normal.     Palpations: Abdomen is soft.     Tenderness: There is no abdominal tenderness. There is no rebound.     Comments: Soft, nontender  Musculoskeletal:        General: Normal range of motion.     Cervical back: Normal range of motion.  Skin:    General: Skin is warm and dry.  Neurological:     Mental Status: He is alert and oriented to person, place, and time.     ED Results / Procedures / Treatments   Labs (all labs ordered are listed, but only abnormal results are displayed) Labs Reviewed  CBC WITH DIFFERENTIAL/PLATELET - Abnormal; Notable for the following components:      Result Value   RBC 2.69 (*)     Hemoglobin 9.0 (*)    HCT 26.3 (*)    RDW 23.9 (*)    Platelets 486 (*)    nRBC 14.6 (*)    Lymphs Abs 4.8 (*)    Monocytes Absolute 1.4 (*)    All other components within normal limits  COMPREHENSIVE METABOLIC PANEL - Abnormal; Notable for the following components:   Potassium 3.4 (*)    Calcium 8.6 (*)    Total Bilirubin 4.6 (*)    All other components within normal limits  RETICULOCYTES - Abnormal; Notable for the following components:   Retic Ct Pct 14.8 (*)    RBC. 2.65 (*)    Retic Count, Absolute 391.5 (*)    Immature Retic Fract 33.0 (*)    All other components within normal limits    EKG None  Radiology No results found.  Procedures Procedures    CRITICAL CARE Performed by: Garlon Hatchet   Total critical care time: 45 minutes  Critical care time was exclusive of separately billable procedures and treating other patients.  Critical care was necessary to treat or prevent imminent or life-threatening deterioration.  Critical care was time spent personally by me on the following activities: development of treatment plan with patient and/or surrogate as well as nursing, discussions with consultants, evaluation of patient's response to treatment, examination of patient, obtaining history from patient or surrogate, ordering and performing treatments and interventions, ordering and review of laboratory studies, ordering and review of radiographic studies, pulse oximetry and re-evaluation of patient's condition.   Medications Ordered in ED Medications  diphenhydrAMINE (BENADRYL) capsule 25-50 mg (25 mg Oral Given 01/16/23 0328)  ondansetron (ZOFRAN) injection 4 mg (4 mg Intravenous Given 01/16/23 0333)  HYDROmorphone (DILAUDID) injection 2 mg (2 mg Intravenous Given 01/16/23 0333)  HYDROmorphone (DILAUDID) injection 2 mg (2 mg Intravenous Given 01/16/23 0403)  HYDROmorphone (DILAUDID) injection 2 mg (2 mg Intravenous Given 01/16/23 0433)  sodium chloride 0.9 % bolus  1,000 mL (0 mLs Intravenous Stopped 01/16/23 0518)  HYDROmorphone (DILAUDID) injection 2 mg (2 mg Intravenous Given 01/16/23 0525)    ED Course/ Medical Decision Making/ A&P                                 Medical Decision Making Amount and/or Complexity of Data Reviewed Labs: ordered. ECG/medicine tests: ordered and independent interpretation performed.  Risk Prescription drug management.   32 year old male with history of sickle cell anemia, presenting to the ED with pain crisis.  Patient reports pain throughout, particularly in the back which is typical for him.  Does feel little dehydrated as he has had some diarrhea recently after eating bad food.  He denies any abdominal pain.  He is afebrile and nontoxic.  Sats around 89% upon arrival, states typical for him during crises.  He denies any shortness of breath, chest pain, cough, or fever.  History of PE and has been compliant with his Eliquis.  Will check labs, give IV fluids, pain control.  Labs as above--hemoglobin stable at 9.  No electrolyte derangement.  Retake counts are elevated consistent with crises.  Patient reports feeling much better after fluids and medications here.  Sats remain around 90%, however on chart review sats are typically anywhere from 88- 92% so this likely represents baseline.  He remains without any complaint of shortness of breath, chest pain, labored breathing, or other signs of distress.  I doubt acute chest syndrome.  Abdomen remains soft, non-tender, tolerating PO food and fluids here.  Feel he is stable for discharge.  Will have him follow-up with sickle cell clinic if needed.  Return here for new concerns.  Final Clinical Impression(s) / ED Diagnoses Final diagnoses:  Sickle cell pain crisis [facility])    Rx / DC Orders ED Discharge Orders     None         Garlon Hatchet, PA-C 01/16/23 0549    Zadie Rhine, MD 01/16/23 [phone]

## 2023-01-20 NOTE — Progress Notes (Signed)
Patient's vitamin D continues to be markedly decreased.  Recommend that he continues Drisdol 50,000 IUs weekly.  Also, vitamin D fortified foods.  Patient's labs are otherwise consistent with his baseline.  Continue hydroxyurea and folic acid daily.  Please ensure that patient has a 3-month follow-up scheduled.   [name]  APRN, [name], [name] Patient Care [facility] [address] [place], [place] [address] [phone]

## 2023-01-28 ENCOUNTER — Non-Acute Institutional Stay (HOSPITAL_COMMUNITY)
Admission: AD | Admit: 2023-01-28 | Discharge: 2023-01-28 | Disposition: A | Discharge status: Home / Self Care | Payer: Self-pay | Source: Ambulatory Visit | Attending: Internal Medicine | Admitting: Internal Medicine

## 2023-01-28 ENCOUNTER — Emergency Department (HOSPITAL_COMMUNITY): Payer: Self-pay

## 2023-01-28 ENCOUNTER — Other Ambulatory Visit: Payer: Self-pay | Admitting: Family Medicine

## 2023-01-28 ENCOUNTER — Emergency Department (HOSPITAL_COMMUNITY)
Admission: EM | Admit: 2023-01-28 | Discharge: 2023-01-28 | Disposition: A | Discharge status: Home / Self Care | Payer: Self-pay | Attending: Emergency Medicine | Admitting: Emergency Medicine

## 2023-01-28 ENCOUNTER — Encounter (HOSPITAL_COMMUNITY): Payer: Self-pay

## 2023-01-28 ENCOUNTER — Other Ambulatory Visit: Payer: Self-pay

## 2023-01-28 ENCOUNTER — Telehealth (HOSPITAL_COMMUNITY): Payer: Self-pay | Admitting: *Deleted

## 2023-01-28 DIAGNOSIS — Z7901 Long term (current) use of anticoagulants: Secondary | ICD-10-CM | POA: Insufficient documentation

## 2023-01-28 DIAGNOSIS — Z21 Asymptomatic human immunodeficiency virus [HIV] infection status: Secondary | ICD-10-CM | POA: Insufficient documentation

## 2023-01-28 DIAGNOSIS — F1190 Opioid use, unspecified, uncomplicated: Secondary | ICD-10-CM

## 2023-01-28 DIAGNOSIS — M25552 Pain in left hip: Secondary | ICD-10-CM | POA: Insufficient documentation

## 2023-01-28 DIAGNOSIS — D571 Sickle-cell disease without crisis: Secondary | ICD-10-CM

## 2023-01-28 DIAGNOSIS — G894 Chronic pain syndrome: Secondary | ICD-10-CM | POA: Insufficient documentation

## 2023-01-28 DIAGNOSIS — D5700 Hb-SS disease with crisis, unspecified: Secondary | ICD-10-CM | POA: Insufficient documentation

## 2023-01-28 DIAGNOSIS — Z86711 Personal history of pulmonary embolism: Secondary | ICD-10-CM | POA: Insufficient documentation

## 2023-01-28 DIAGNOSIS — Z5982 Transportation insecurity: Secondary | ICD-10-CM | POA: Insufficient documentation

## 2023-01-28 DIAGNOSIS — M25551 Pain in right hip: Secondary | ICD-10-CM | POA: Insufficient documentation

## 2023-01-28 DIAGNOSIS — F1120 Opioid dependence, uncomplicated: Secondary | ICD-10-CM | POA: Insufficient documentation

## 2023-01-28 DIAGNOSIS — Z5986 Financial insecurity: Secondary | ICD-10-CM | POA: Insufficient documentation

## 2023-01-28 DIAGNOSIS — D638 Anemia in other chronic diseases classified elsewhere: Secondary | ICD-10-CM | POA: Insufficient documentation

## 2023-01-28 LAB — COMPREHENSIVE METABOLIC PANEL
ALT: 30 U/L (ref 0–44)
AST: 36 U/L (ref 15–41)
Albumin: 4.3 g/dL (ref 3.5–5.0)
Alkaline Phosphatase: 63 U/L (ref 38–126)
Anion gap: 7 (ref 5–15)
BUN: 17 mg/dL (ref 6–20)
CO2: 24 mmol/L (ref 22–32)
Calcium: 8.8 mg/dL — ABNORMAL LOW (ref 8.9–10.3)
Chloride: 108 mmol/L (ref 98–111)
Creatinine, Ser: 1.17 mg/dL (ref 0.61–1.24)
GFR, Estimated: >60 mL/min (ref >60)
Glucose, Bld: 135 mg/dL — ABNORMAL HIGH (ref 70–99)
Potassium: 3.5 mmol/L (ref 3.5–5.1)
Sodium: 139 mmol/L (ref 135–145)
Total Bilirubin: 6.2 mg/dL — ABNORMAL HIGH (ref 0.3–1.2)
Total Protein: 8.5 g/dL — ABNORMAL HIGH (ref 6.5–8.1)

## 2023-01-28 LAB — CBC WITH DIFFERENTIAL/PLATELET
Abs Immature Granulocytes: 0.12 10*3/uL — ABNORMAL HIGH (ref 0.00–0.07)
Basophils Absolute: 0 10*3/uL (ref 0.0–0.1)
Basophils Relative: 0 %
Eosinophils Absolute: 0.1 10*3/uL (ref 0.0–0.5)
Eosinophils Relative: 1 %
HCT: 28.4 % — ABNORMAL LOW (ref 39.0–52.0)
Hemoglobin: 9.8 g/dL — ABNORMAL LOW (ref 13.0–17.0)
Immature Granulocytes: 1 %
Lymphocytes Relative: 52 %
Lymphs Abs: 6.6 10*3/uL — ABNORMAL HIGH (ref 0.7–4.0)
MCH: 34.3 pg — ABNORMAL HIGH (ref 26.0–34.0)
MCHC: 34.5 g/dL (ref 30.0–36.0)
MCV: 99.3 fL (ref 80.0–100.0)
Monocytes Absolute: 1.6 10*3/uL — ABNORMAL HIGH (ref 0.1–1.0)
Monocytes Relative: 13 %
Neutro Abs: 4.1 10*3/uL (ref 1.7–7.7)
Neutrophils Relative %: 33 %
Platelets: 349 10*3/uL (ref 150–400)
RBC: 2.86 MIL/uL — ABNORMAL LOW (ref 4.22–5.81)
RDW: 27.2 % — ABNORMAL HIGH (ref 11.5–15.5)
WBC: 12.5 10*3/uL — ABNORMAL HIGH (ref 4.0–10.5)
nRBC: 13.5 % — ABNORMAL HIGH (ref 0.0–0.2)

## 2023-01-28 LAB — RETICULOCYTES
Immature Retic Fract: 28 % — ABNORMAL HIGH (ref 2.3–15.9)
RBC.: 2.8 MIL/uL — ABNORMAL LOW (ref 4.22–5.81)
Retic Count, Absolute: 507 10*3/uL — ABNORMAL HIGH (ref 19.0–186.0)
Retic Ct Pct: 18.1 % — ABNORMAL HIGH (ref 0.4–3.1)

## 2023-01-28 MED ORDER — DIPHENHYDRAMINE HCL 25 MG PO CAPS: 25.0000 mg | ORAL_CAPSULE | ORAL | Status: DC | PRN

## 2023-01-28 MED ORDER — HYDROMORPHONE 1 MG/ML IV SOLN
INTRAVENOUS | Status: DC
  Administered 2023-01-28: 30 mg via INTRAVENOUS
  Administered 2023-01-28: 15 mg via INTRAVENOUS
  Filled 2023-01-28: qty 30

## 2023-01-28 MED ORDER — HEPARIN SOD (PORK) LOCK FLUSH 100 UNIT/ML IV SOLN
500.0000 [IU] | Freq: Once | INTRAVENOUS | Status: AC
  Administered 2023-01-28: 500 [IU]
  Filled 2023-01-28: qty 5

## 2023-01-28 MED ORDER — HYDROMORPHONE HCL 2 MG/ML IJ SOLN
2.0000 mg | INTRAMUSCULAR | Status: AC
  Administered 2023-01-28: 2 mg via INTRAVENOUS
  Filled 2023-01-28: qty 1

## 2023-01-28 MED ORDER — ONDANSETRON HCL 4 MG/2ML IJ SOLN
4.0000 mg | INTRAMUSCULAR | Status: DC | PRN
  Administered 2023-01-28: 4 mg via INTRAVENOUS
  Filled 2023-01-28: qty 2

## 2023-01-28 MED ORDER — NALOXONE HCL 0.4 MG/ML IJ SOLN: 0.4000 mg | INTRAMUSCULAR | Status: DC | PRN

## 2023-01-28 MED ORDER — ACETAMINOPHEN 500 MG PO TABS
1000.0000 mg | ORAL_TABLET | Freq: Once | ORAL | Status: AC
  Administered 2023-01-28: 1000 mg via ORAL
  Filled 2023-01-28: qty 2

## 2023-01-28 MED ORDER — SODIUM CHLORIDE 0.9% FLUSH
10.0000 mL | INTRAVENOUS | Status: AC | PRN
  Administered 2023-01-28: 10 mL

## 2023-01-28 MED ORDER — OXYCODONE-ACETAMINOPHEN 10-325 MG PO TABS: 1.0000 | ORAL_TABLET | ORAL | 0 refills | Status: DC | PRN

## 2023-01-28 MED ORDER — SODIUM CHLORIDE 0.9% FLUSH: 9.0000 mL | INTRAVENOUS | Status: DC | PRN

## 2023-01-28 MED ORDER — HEPARIN SOD (PORK) LOCK FLUSH 100 UNIT/ML IV SOLN
500.0000 [IU] | INTRAVENOUS | Status: AC | PRN
  Administered 2023-01-28: 500 [IU]
  Filled 2023-01-28: qty 5

## 2023-01-28 MED ORDER — DIPHENHYDRAMINE HCL 25 MG PO CAPS
25.0000 mg | ORAL_CAPSULE | ORAL | Status: DC | PRN
  Administered 2023-01-28: 25 mg via ORAL
  Filled 2023-01-28: qty 1

## 2023-01-28 MED ORDER — ONDANSETRON HCL 4 MG/2ML IJ SOLN: 4.0000 mg | Freq: Four times a day (QID) | INTRAMUSCULAR | Status: DC | PRN

## 2023-01-28 NOTE — Progress Notes (Signed)
Reviewed PDMP substance reporting system prior to prescribing opiate medications. No inconsistencies noted.  Meds ordered this encounter  Medications   oxyCODONE-acetaminophen (PERCOCET) 10-325 MG tablet    Sig: Take 1 tablet by mouth every 4 hours as needed for pain.    Dispense:  90 tablet    Refill:  0    Order Specific Question:   Supervising Provider    Answer:   JEGEDE, OLUGBEMIGA E [[id number]]   Tevita Gomer Moore Shivank Pinedo  APRN, [name], [name] [facility] [address]  [place], [place] [address] [phone]  

## 2023-01-28 NOTE — Progress Notes (Signed)
Pt admitted to day hospital for sickle cell pain treatment. On arrival, pt rates 10/10 back and shoulders. Pt received Dilaudid PCA via PAC, and PO Tylenol. At discharge, pt rates pain 6/10. PAC de-accessed and flushed with 0.9% Sodium Chloride and Heparin, site is covered with band-aid. AVS offered, but pt declined. Pt is alert, oriented, and ambulatory at discharge.

## 2023-01-28 NOTE — ED Triage Notes (Signed)
Patient reports he is having a sickle cell crisis and having pain everywhere. States the pain started this morning. Rates pain 10/10. Patient is 89-88%in triage. 3L [place] started.

## 2023-01-28 NOTE — ED Provider Notes (Signed)
[place] EMERGENCY DEPARTMENT AT [facility] Provider Note   CSN: [id number] Arrival date & time: 01/28/23  0105     History  Chief Complaint  Patient presents with   Sickle Cell Pain Crisis    Martin [name] is a 32 y.o. male.  The history is provided by the patient.  Sickle Cell Pain Crisis Martin [name] is a 32 y.o. male who presents to the Emergency Department complaining of sickle cell pain crisis.  He presents to the emergency department for what he describes as a pain crisis that started on Wednesday morning.  He has pain all over his body, particularly in his hips.  No associated fever, difficulty breathing, chest pain, nausea, vomiting, diarrhea.  He tried his home Percocet 10 without significant improvement in his pain.  He also has a history of HIV, PE on anticoagulation.  He is compliant with all of his medications.  He also has as needed oxygen available at home.      Home Medications Prior to Admission medications   Medication Sig Start Date End Date Taking? Authorizing Provider  abacavir (ZIAGEN) 300 MG tablet Take 600 mg by mouth at bedtime. 12/30/22   [provider]  apixaban (ELIQUIS) 5 MG TABS tablet Take 1 tablet (5 mg total) by mouth 2 (two) times daily. 12/02/21   Massie Maroon, [name]  cyclobenzaprine (FLEXERIL) 10 MG tablet Take 10 mg by mouth 2 (two) times daily as needed. 12/22/22   [provider]  Deferiprone (FERRIPROX) 500 MG TABS Take 1 tablet by mouth daily. Patient not taking: Reported on 01/16/2023 10/19/22   Massie Maroon, [name]  ergocalciferol (VITAMIN D2) 1.25 MG (50000 UT) capsule Take 1 capsule (50,000 Units total) by mouth once a week. 10/14/22   Massie Maroon, [name]  folic acid (FOLVITE) 1 MG tablet Take 1 mg by mouth daily. 10/10/21   [provider]  gabapentin (NEURONTIN) 300 MG capsule Take 300 mg by mouth at bedtime as needed.    [provider]  hydroxyurea (HYDREA) 500 MG  capsule Take 4 capsules (2,000 mg total) by mouth at bedtime. 10/07/20   Massie Maroon, [name]  lamivudine (EPIVIR) 300 MG tablet Take 300 mg by mouth at bedtime. 12/30/22   [provider]  NARCAN 4 MG/0.1ML LIQD nasal spray kit Place 1 spray into the nose once. 01/07/23   [provider]  oxyCODONE (ROXICODONE) 15 MG immediate release tablet Take 15 mg by mouth 4 (four) times daily as needed. 12/25/22   [provider]  oxyCODONE-acetaminophen (PERCOCET) 10-325 MG tablet Take 1 tablet by mouth every 4 hours as needed for pain. 01/15/23   Massie Maroon, [name]  pantoprazole (PROTONIX) 40 MG tablet Take 40 mg by mouth daily as needed. 12/25/22   [provider]  TIVICAY 50 MG tablet Take 50 mg by mouth at bedtime. 12/30/22   [provider]  triamcinolone cream (KENALOG) 0.1 % Apply 1 Application topically 2 (two) times daily. Patient not taking: Reported on 01/16/2023 01/12/23   Massie Maroon, [name]      Allergies    Ketorolac tromethamine, Tape, and Wound dressing adhesive    Review of Systems   Review of Systems  All other systems reviewed and are negative.   Physical Exam Updated Vital Signs BP 107/75   Pulse 76   Temp 98.5 F (36.9 C) (Oral)   Resp 17   Ht 6\' 3"  (1.905 m)   Wt  63.5 kg   SpO2 94%   BMI 17.50 kg/m  Physical Exam Vitals and nursing note reviewed.  Constitutional:      Appearance: He is well-developed.  HENT:     Head: Normocephalic and atraumatic.  Cardiovascular:     Rate and Rhythm: Normal rate and regular rhythm.     Heart sounds: No murmur heard. Pulmonary:     Effort: Pulmonary effort is normal. No respiratory distress.     Breath sounds: Normal breath sounds.  Abdominal:     Palpations: Abdomen is soft.     Tenderness: There is no abdominal tenderness. There is no guarding or rebound.  Musculoskeletal:        General: No swelling or tenderness.  Skin:    General: Skin is warm and dry.  Neurological:      Mental Status: He is alert and oriented to person, place, and time.  Psychiatric:        Behavior: Behavior normal.     ED Results / Procedures / Treatments   Labs (all labs ordered are listed, but only abnormal results are displayed) Labs Reviewed  COMPREHENSIVE METABOLIC PANEL - Abnormal; Notable for the following components:      Result Value   Glucose, Bld 135 (*)    Calcium 8.8 (*)    Total Protein 8.5 (*)    Total Bilirubin 6.2 (*)    All other components within normal limits  CBC WITH DIFFERENTIAL/PLATELET - Abnormal; Notable for the following components:   WBC 12.5 (*)    RBC 2.86 (*)    Hemoglobin 9.8 (*)    HCT 28.4 (*)    MCH 34.3 (*)    RDW 27.2 (*)    nRBC 13.5 (*)    Lymphs Abs 6.6 (*)    Monocytes Absolute 1.6 (*)    Abs Immature Granulocytes 0.12 (*)    All other components within normal limits  RETICULOCYTES - Abnormal; Notable for the following components:   Retic Ct Pct 18.1 (*)    RBC. 2.80 (*)    Retic Count, Absolute 507.0 (*)    Immature Retic Fract 28.0 (*)    All other components within normal limits    EKG EKG Interpretation Date/Time:  Thursday January 28 2023 01:14:10 EDT Ventricular Rate:  86 PR Interval:  149 QRS Duration:  88 QT Interval:  398 QTC Calculation: 476 R Axis:   79  Text Interpretation: Sinus rhythm Probable left ventricular hypertrophy Borderline prolonged QT interval Confirmed by Tilden Fossa [phone]) on 01/28/2023 3:38:04 AM  Radiology DG Chest Port 1 View  Result Date: 01/28/2023 CLINICAL DATA:  Sickle cell pain crisis, hypoxia. EXAM: PORTABLE CHEST 1 VIEW COMPARISON:  09/25/2022. FINDINGS: The heart size and mediastinal contours are within normal limits. A right chest port is stable in position. Scattered airspace disease and interstitial prominence is noted in the lungs bilaterally. No effusion or pneumothorax. No acute osseous abnormality. IMPRESSION: Scattered airspace airspace opacities and interstitial  prominence in the lungs bilaterally, unchanged from the previous exam, which may be infectious or inflammatory. Electronically Signed   By: Thornell Sartorius M.D.   On: 01/28/2023 03:57    Procedures Procedures    Medications Ordered in ED Medications  diphenhydrAMINE (BENADRYL) capsule 25-50 mg (25 mg Oral Given 01/28/23 0402)  ondansetron (ZOFRAN) injection 4 mg (4 mg Intravenous Given 01/28/23 0359)  heparin lock flush 100 unit/mL (has no administration in time range)  HYDROmorphone (DILAUDID) injection 2 mg (2 mg Intravenous Given 01/28/23  0400)  HYDROmorphone (DILAUDID) injection 2 mg (2 mg Intravenous Given 01/28/23 0451)  HYDROmorphone (DILAUDID) injection 2 mg (2 mg Intravenous Given 01/28/23 0535)    ED Course/ Medical Decision Making/ A&P                                 Medical Decision Making Amount and/or Complexity of Data Reviewed Labs: ordered. Radiology: ordered.  Risk Prescription drug management.   Patient with history of sickle cell anemia, PE, HIV here for evaluation of pain similar to prior pain crises.  He does have borderline sats on ED evaluation without any respiratory distress.  He is asymptomatic from a respiratory standpoint.  CBC with mild leukocytosis, anemia is near his baseline.  Reticulocyte count is appropriate.  He had a chest x-ray obtained, which does demonstrate stable infiltrates.  He is not acutely symptomatic from this standpoint.  Current picture is not consistent with acute chest, PE, pneumonia.  Patient is feeling improved after pain medications in the department, but prefers to go home with outpatient follow-up.  Plan to discharge home with outpatient resources and return precautions.        Final Clinical Impression(s) / ED Diagnoses Final diagnoses:  Sickle cell pain crisis [facility])    Rx / DC Orders ED Discharge Orders     None         Tilden Fossa, MD 01/28/23 [id number]

## 2023-01-28 NOTE — ED Notes (Signed)
Ok for Pt to eat and drink per MD. Pt provided with a ham sandwich and coke.

## 2023-01-28 NOTE — ED Notes (Signed)
PT request for labs to come from port 

## 2023-01-28 NOTE — Telephone Encounter (Signed)
Patient called requesting to come to the day hospital for sickle cell pain. Patient reports generalized pain rated 10/10. Reports taking Percocet at 6:00 am. Patient was treated at [facility] last night and was discharged this morning. COVID-19 screening done and patient denies all symptoms and exposures. Denies fever, chest pain, nausea, vomiting, diarrhea, abdominal pain and priapism. Admits to having transportation without driving self. [place], [name] notified. Patient can come to the day hospital for pain management. Patient advised and expresses an understanding.

## 2023-01-28 NOTE — H&P (Signed)
[facility] History and Physical   Date: 01/28/2023  Patient name: Martin [name] Medical record number: [id number] Date of birth: [birth year] Age: 32 y.o. Gender: male PCP: Massie Maroon, [name]  Attending physician: Quentin Angst, MD  Chief Complaint: Sickle cell pain  History of Present Illness: Nazair Kowalke is a 32 year old male with a medical history significant for sickle cell disease, chronic pain syndrome, opiate dependence and tolerance, history of HIV, history of PE on Eliquis, and anemia of chronic disease presents with complaints of allover body pain for the past 2 days.  Patient was treated and evaluated in the emergency department this a.m. without sustained relief.  Patient states that he was advised to report to sickle cell clinic for further management of his sickle cell pain crisis.  Patient states that pain intensity is 9/10, constant, and throbbing.  His pain has been persisting despite his home medications.  While in the emergency department, he was treated with IV Dilaudid and IV fluids.  Patient currently denies any fever, chills, chest pain, or shortness of breath.  No urinary symptoms, nausea, vomiting, or diarrhea.  Meds: Medications Prior to Admission  Medication Sig Dispense Refill Last Dose   abacavir (ZIAGEN) 300 MG tablet Take 600 mg by mouth at bedtime.   01/27/2023   apixaban (ELIQUIS) 5 MG TABS tablet Take 1 tablet (5 mg total) by mouth 2 (two) times daily. 60 tablet 11 01/27/2023   cyclobenzaprine (FLEXERIL) 10 MG tablet Take 10 mg by mouth 2 (two) times daily as needed.   Past Week   ergocalciferol (VITAMIN D2) 1.25 MG (50000 UT) capsule Take 1 capsule (50,000 Units total) by mouth once a week. 12 capsule 3 Past Week   folic acid (FOLVITE) 1 MG tablet Take 1 mg by mouth daily.   01/27/2023   gabapentin (NEURONTIN) 300 MG capsule Take 300 mg by mouth at bedtime as needed.   01/27/2023   hydroxyurea (HYDREA) 500 MG capsule Take 4  capsules (2,000 mg total) by mouth at bedtime. 120 capsule 11 01/27/2023   lamivudine (EPIVIR) 300 MG tablet Take 300 mg by mouth at bedtime.   01/27/2023   oxyCODONE (ROXICODONE) 15 MG immediate release tablet Take 15 mg by mouth 4 (four) times daily as needed.   Past Week   oxyCODONE-acetaminophen (PERCOCET) 10-325 MG tablet Take 1 tablet by mouth every 4 hours as needed for pain. 90 tablet 0 01/28/2023   pantoprazole (PROTONIX) 40 MG tablet Take 40 mg by mouth daily as needed.   Past Week   TIVICAY 50 MG tablet Take 50 mg by mouth at bedtime.   01/27/2023   triamcinolone cream (KENALOG) 0.1 % Apply 1 Application topically 2 (two) times daily. 30 g 2 Past Week   Deferiprone (FERRIPROX) 500 MG TABS Take 1 tablet by mouth daily. (Patient not taking: Reported on 01/16/2023) 270 tablet 1 Unknown   NARCAN 4 MG/0.1ML LIQD nasal spray kit Place 1 spray into the nose once.       Allergies: Ketorolac tromethamine, Tape, and Wound dressing adhesive Past Medical History:  Diagnosis Date   Anxiety    HIV (human immunodeficiency virus infection) (HCC)    Proteinuria    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HCC)    Vitamin D deficiency 10/2018   Past Surgical History:  Procedure Laterality Date   IR IMAGING GUIDED PORT INSERTION  08/29/2019   IR REMOVAL TUN ACCESS W/ PORT W/O FL MOD SED  08/30/2020   TEE WITHOUT  CARDIOVERSION N/A 08/30/2020   Procedure: TRANSESOPHAGEAL ECHOCARDIOGRAM (TEE);  Surgeon: [name], MD;  Location: [facility] ENDOSCOPY;  Service: Cardiovascular;  Laterality: N/A;   Family History  Problem Relation Age of Onset   Sickle cell trait Mother    Sickle cell trait Father    Birth defects Maternal Grandmother    Birth defects Paternal Grandmother    Social History   Socioeconomic History   Marital status: Single    Spouse name: Not on file   Number of children: Not on file   Years of education: Not on file   Highest education level: 12th grade  Occupational History   Occupation: disabled   Tobacco Use   Smoking status: Never   Smokeless tobacco: Never  Vaping Use   Vaping status: Never Used  Substance and Sexual Activity   Alcohol use: No   Drug use: Yes    Types: Marijuana    Comment: 2x week   Sexual activity: Yes    Birth control/protection: Condom  Other Topics Concern   Not on file  Social History Narrative   Not on file   Social Determinants of Health   Financial Resource Strain: Medium Risk (01/11/2023)   Overall Financial Resource Strain (CARDIA)    Difficulty of Paying Living Expenses: Somewhat hard  Food Insecurity: Food Insecurity Present (01/11/2023)   Hunger Vital Sign    Worried About Running Out of Food in the Last Year: Sometimes true    Ran Out of Food in the Last Year: Sometimes true  Transportation Needs: Unmet Transportation Needs (01/11/2023)   PRAPARE - Transportation    Lack of Transportation (Medical): Yes    Lack of Transportation (Non-Medical): Yes  Physical Activity: Insufficiently Active (01/11/2023)   Exercise Vital Sign    Days of Exercise per Week: 3 days    Minutes of Exercise per Session: 10 min  Stress: No Stress Concern Present (01/11/2023)   [employer] of Occupational Health - Occupational Stress Questionnaire    Feeling of Stress : Only a little  Social Connections: Moderately Isolated (01/11/2023)   Social Connection and Isolation Panel [NHANES]    Frequency of Communication with Friends and Family: More than three times a week    Frequency of Social Gatherings with Friends and Family: Once a week    Attends Religious Services: 1 to 4 times per year    Active Member of [employer] or Organizations: No    Attends Banker Meetings: Not on file    Marital Status: Never married  Intimate Partner Violence: Not At Risk (09/26/2022)   Humiliation, Afraid, Rape, and Kick questionnaire    Fear of Current or Ex-Partner: No    Emotionally Abused: No    Physically Abused: No    Sexually Abused: No   Review of Systems   Constitutional: Negative.   HENT: Negative.    Cardiovascular: Negative.   Gastrointestinal: Negative.   Genitourinary: Negative.   Musculoskeletal:  Positive for back pain and joint pain.  Skin: Negative.   Neurological: Negative.   Psychiatric/Behavioral: Negative.      Physical Exam: Blood pressure 109/71, temperature 98 F (36.7 C), temperature source Temporal, resp. rate 16, SpO2 92%. Physical Exam Constitutional:      Appearance: Normal appearance.  Eyes:     Pupils: Pupils are equal, round, and reactive to light.  Cardiovascular:     Rate and Rhythm: Normal rate and regular rhythm.  Pulmonary:     Effort: Pulmonary effort is normal.  Abdominal:     General: Bowel sounds are normal.  Neurological:     General: No focal deficit present.     Mental Status: He is alert. Mental status is at baseline.  Psychiatric:        Mood and Affect: Mood normal.        Behavior: Behavior normal.        Thought Content: Thought content normal.        Judgment: Judgment normal.      Lab results: Results for orders placed or performed during the hospital encounter of 01/28/23 (from the past 24 hour(s))  Comprehensive metabolic panel     Status: Abnormal   Collection Time: 01/28/23  2:12 AM  Result Value Ref Range   Sodium 139 135 - 145 mmol/L   Potassium 3.5 3.5 - 5.1 mmol/L   Chloride 108 98 - 111 mmol/L   CO2 24 22 - 32 mmol/L   Glucose, Bld 135 (H) 70 - 99 mg/dL   BUN 17 6 - 20 mg/dL   Creatinine, Ser 7.82 0.61 - 1.24 mg/dL   Calcium 8.8 (L) 8.9 - 10.3 mg/dL   Total Protein 8.5 (H) 6.5 - 8.1 g/dL   Albumin 4.3 3.5 - 5.0 g/dL   AST 36 15 - 41 U/L   ALT 30 0 - 44 U/L   Alkaline Phosphatase 63 38 - 126 U/L   Total Bilirubin 6.2 (H) 0.3 - 1.2 mg/dL   GFR, Estimated >95 >62 mL/min   Anion gap 7 5 - 15  CBC with Differential     Status: Abnormal   Collection Time: 01/28/23  2:12 AM  Result Value Ref Range   WBC 12.5 (H) 4.0 - 10.5 K/uL   RBC 2.86 (L) 4.22 - 5.81 MIL/uL    Hemoglobin 9.8 (L) 13.0 - 17.0 g/dL   HCT 13.0 (L) 86.5 - 78.4 %   MCV 99.3 80.0 - 100.0 fL   MCH 34.3 (H) 26.0 - 34.0 pg   MCHC 34.5 30.0 - 36.0 g/dL   RDW 69.6 (H) 29.5 - 28.4 %   Platelets 349 150 - 400 K/uL   nRBC 13.5 (H) 0.0 - 0.2 %   Neutrophils Relative % 33 %   Neutro Abs 4.1 1.7 - 7.7 K/uL   Lymphocytes Relative 52 %   Lymphs Abs 6.6 (H) 0.7 - 4.0 K/uL   Monocytes Relative 13 %   Monocytes Absolute 1.6 (H) 0.1 - 1.0 K/uL   Eosinophils Relative 1 %   Eosinophils Absolute 0.1 0.0 - 0.5 K/uL   Basophils Relative 0 %   Basophils Absolute 0.0 0.0 - 0.1 K/uL   Immature Granulocytes 1 %   Abs Immature Granulocytes 0.12 (H) 0.00 - 0.07 K/uL   Polychromasia PRESENT    Sickle Cells PRESENT   Reticulocytes     Status: Abnormal   Collection Time: 01/28/23  2:12 AM  Result Value Ref Range   Retic Ct Pct 18.1 (H) 0.4 - 3.1 %   RBC. 2.80 (L) 4.22 - 5.81 MIL/uL   Retic Count, Absolute 507.0 (H) 19.0 - 186.0 K/uL   Immature Retic Fract 28.0 (H) 2.3 - 15.9 %    Imaging results:  DG Chest Port 1 View  Result Date: 01/28/2023 CLINICAL DATA:  Sickle cell pain crisis, hypoxia. EXAM: PORTABLE CHEST 1 VIEW COMPARISON:  09/25/2022. FINDINGS: The heart size and mediastinal contours are within normal limits. A right chest port is stable in position. Scattered airspace disease and interstitial prominence  is noted in the lungs bilaterally. No effusion or pneumothorax. No acute osseous abnormality. IMPRESSION: Scattered airspace airspace opacities and interstitial prominence in the lungs bilaterally, unchanged from the previous exam, which may be infectious or inflammatory. Electronically Signed   By: Thornell Sartorius M.D.   On: 01/28/2023 03:57     Assessment & Plan: Patient admitted to sickle cell day infusion center for management of pain crisis.  Patient is opiate tolerant Initiate IV dilaudid PCA.  Tylenol 1000 mg by mouth times one dose Review CBC with differential, complete metabolic  panel, and reticulocytes as results become available. Baseline hemoglobin is Pain intensity will be reevaluated in context of functioning and relationship to baseline as care progresses If pain intensity remains elevated and/or sudden change in hemodynamic stability transition to inpatient services for higher level of care.     [name]  APRN, [name], [name] Patient Care [facility] [address] [place], [place] [address] [phone]  01/28/2023, 9:52 AM

## 2023-01-28 NOTE — Telephone Encounter (Signed)
Caller & Relationship to patient:  MRN #  [id number]   Call Back Number:   Date of Last Office Visit: 01/15/2023     Date of Next Office Visit: 04/20/2023    Medication(s) to be Refilled: Percocet  Preferred Pharmacy:   ** Please notify patient to allow 48-72 hours to process** **Let patient know to contact pharmacy at the end of the day to make sure medication is ready. ** **If patient has not been seen in a year or longer, book an appointment **Advise to use MyChart for refill requests OR to contact their pharmacy

## 2023-01-29 ENCOUNTER — Other Ambulatory Visit: Payer: Self-pay | Admitting: Family Medicine

## 2023-01-29 ENCOUNTER — Other Ambulatory Visit (HOSPITAL_COMMUNITY): Payer: Self-pay

## 2023-01-29 ENCOUNTER — Telehealth: Payer: Self-pay | Admitting: Family Medicine

## 2023-01-29 DIAGNOSIS — D571 Sickle-cell disease without crisis: Secondary | ICD-10-CM

## 2023-01-29 DIAGNOSIS — F1190 Opioid use, unspecified, uncomplicated: Secondary | ICD-10-CM

## 2023-01-29 MED ORDER — OXYCODONE-ACETAMINOPHEN 10-325 MG PO TABS
1.0000 | ORAL_TABLET | ORAL | 0 refills | Status: DC | PRN
Start: 2023-01-30 — End: 2023-02-19
  Filled 2023-01-30: qty 90, 15d supply, fill #0

## 2023-01-29 NOTE — Telephone Encounter (Signed)
Pt called requesting his refill for oxycodone be sent to the [facility] long outpatient pharmacy instead of the [facility] pharmacy

## 2023-01-29 NOTE — Progress Notes (Signed)
Reviewed PDMP substance reporting system prior to prescribing opiate medications. No inconsistencies noted.  Meds ordered this encounter  Medications   oxyCODONE-acetaminophen (PERCOCET) 10-325 MG tablet    Sig: Take 1 tablet by mouth every 4 hours as needed for pain.    Dispense:  90 tablet    Refill:  0    Order Specific Question:   Supervising Provider    Answer:   JEGEDE, OLUGBEMIGA E [[id number]]   Tevita Gomer Moore Shivank Pinedo  APRN, [name], [name] [facility] [address]  [place], [place] [address] [phone]  

## 2023-01-30 ENCOUNTER — Other Ambulatory Visit (HOSPITAL_COMMUNITY): Payer: Self-pay

## 2023-02-01 ENCOUNTER — Other Ambulatory Visit (HOSPITAL_COMMUNITY): Payer: Self-pay

## 2023-02-01 NOTE — Discharge Summary (Signed)
[facility] Discharge Summary   Patient ID: Martin [name] MRN: [id number] DOB/AGE: 04-29-1990 32 y.o.  Admit date: 01/28/2023 Discharge date: 01/28/2023  Primary Care Physician:  Massie Maroon, [name]  Admission Diagnoses:  Principal Problem:   Sickle cell pain crisis [name]    Discharge Medications:  Allergies as of 01/28/2023       Reactions   Ketorolac Tromethamine Swelling, Other (See Comments)   Patient reports facial edema and left arm edema after administration.   Tape Rash, Other (See Comments)   PLEASE DO NOT USE THE CLEAR, THICK, "PLASTIC" TAPE- only paper tape is tolerated    Wound Dressing Adhesive Rash        Medication List     TAKE these medications    abacavir 300 MG tablet Commonly known as: ZIAGEN Take 600 mg by mouth at bedtime.   cyclobenzaprine 10 MG tablet Commonly known as: FLEXERIL Take 10 mg by mouth 2 (two) times daily as needed.   Deferiprone 500 MG Tabs Commonly known as: Ferriprox Take 1 tablet by mouth daily.   Eliquis 5 MG Tabs tablet Generic drug: apixaban Take 1 tablet (5 mg total) by mouth 2 (two) times daily.   ergocalciferol 1.25 MG (50000 UT) capsule Commonly known as: VITAMIN D2 Take 1 capsule (50,000 Units total) by mouth once a week.   folic acid 1 MG tablet Commonly known as: FOLVITE Take 1 mg by mouth daily.   gabapentin 300 MG capsule Commonly known as: NEURONTIN Take 300 mg by mouth at bedtime as needed.   hydroxyurea 500 MG capsule Commonly known as: HYDREA Take 4 capsules (2,000 mg total) by mouth at bedtime.   lamivudine 300 MG tablet Commonly known as: EPIVIR Take 300 mg by mouth at bedtime.   Narcan 4 MG/0.1ML Liqd nasal spray kit Generic drug: naloxone Place 1 spray into the nose once.   oxyCODONE 15 MG immediate release tablet Commonly known as: ROXICODONE Take 15 mg by mouth 4 (four) times daily as needed.   pantoprazole 40 MG tablet Commonly known as:  PROTONIX Take 40 mg by mouth daily as needed.   Tivicay 50 MG tablet Generic drug: dolutegravir Take 50 mg by mouth at bedtime.   triamcinolone cream 0.1 % Commonly known as: KENALOG Apply 1 Application topically 2 (two) times daily.         Consults:  None  Significant Diagnostic Studies:  DG Chest Port 1 View  Result Date: 01/28/2023 CLINICAL DATA:  Sickle cell pain crisis, hypoxia. EXAM: PORTABLE CHEST 1 VIEW COMPARISON:  09/25/2022. FINDINGS: The heart size and mediastinal contours are within normal limits. A right chest port is stable in position. Scattered airspace disease and interstitial prominence is noted in the lungs bilaterally. No effusion or pneumothorax. No acute osseous abnormality. IMPRESSION: Scattered airspace airspace opacities and interstitial prominence in the lungs bilaterally, unchanged from the previous exam, which may be infectious or inflammatory. Electronically Signed   By: Thornell Sartorius M.D.   On: 01/28/2023 03:57     History of Present Illness: Martin [name] is a 32 year old male male with a medical history significant for sickle cell disease, chronic pain syndrome, opiate dependence and tolerance, history of HIV, history of PE on Eliquis, and anemia of chronic disease presents with complaints of allover body pain for the past 2 days.  Patient was treated and evaluated in the emergency department this a.m. without sustained relief.  Patient states that he was advised to report to sickle  cell clinic for further management of his sickle cell pain crisis.  Patient states that pain intensity is 9/10, constant, and throbbing.  His pain has been persisting despite his home medications.  While in the emergency department, he was treated with IV Dilaudid and IV fluids.  Patient currently denies any fever, chills, chest pain, or shortness of breath.  No urinary symptoms, nausea, vomiting, or diarrhea [facility] Course: Patient admitted for sickle cell  pain crisis.  Reviewed labs from emergency department, consistent with patient's baseline and did not warrant repeating. Pain managed with IV Dilaudid PCA, Toradol, and Tylenol. Pain intensity decreased throughout admission and patient is requesting discharge home. He is alert, oriented, and ambulating without assistance.  Patient will discharge home in a hemodynamically stable condition.  Discharge instructions: Resume all home medications.   Follow up with PCP as previously  scheduled.   Discussed the importance of drinking 64 ounces of water daily, dehydration of red blood cells may lead further sickling.   Avoid all stressors that precipitate sickle cell pain crisis.     The patient was given clear instructions to go to ER or return to medical center if symptoms do not improve, worsen or new problems develop.   Physical Exam at Discharge:  BP 107/86 (BP Location: Right Arm)   Pulse 83   Temp 98 F (36.7 C) (Temporal)   Resp 12   SpO2 90%  Physical Exam Constitutional:      Appearance: Normal appearance.  Eyes:     Pupils: Pupils are equal, round, and reactive to light.  Cardiovascular:     Rate and Rhythm: Normal rate and regular rhythm.  Pulmonary:     Effort: Pulmonary effort is normal.  Abdominal:     General: Bowel sounds are normal.  Skin:    General: Skin is warm.  Neurological:     General: No focal deficit present.     Mental Status: He is alert. Mental status is at baseline.  Psychiatric:        Mood and Affect: Mood normal.        Behavior: Behavior normal.        Thought Content: Thought content normal.        Judgment: Judgment normal.       Disposition at Discharge: Discharge disposition: 01-Home or Self Care       Discharge Orders: Discharge Instructions     Discharge patient   Complete by: As directed    Discharge disposition: 01-Home or Self Care   Discharge patient date: 01/28/2023       Condition at Discharge:    Stable  Time spent on Discharge:  Greater than 30 minutes.  Signed: [name]  APRN, [name], [name] Patient Care [facility] [address] [place], [place] [address] [phone]  02/01/2023, 5:00 PM

## 2023-02-19 ENCOUNTER — Other Ambulatory Visit: Payer: Self-pay | Admitting: Family Medicine

## 2023-02-19 ENCOUNTER — Telehealth: Payer: Self-pay | Admitting: Family Medicine

## 2023-02-19 DIAGNOSIS — F1190 Opioid use, unspecified, uncomplicated: Secondary | ICD-10-CM

## 2023-02-19 DIAGNOSIS — D571 Sickle-cell disease without crisis: Secondary | ICD-10-CM

## 2023-02-19 MED ORDER — OXYCODONE-ACETAMINOPHEN 10-325 MG PO TABS: 1.0000 | ORAL_TABLET | ORAL | 0 refills | Status: DC | PRN

## 2023-02-19 NOTE — Progress Notes (Unsigned)
Reviewed PDMP substance reporting system prior to prescribing opiate medications. No inconsistencies noted.  No orders of the defined types were placed in this encounter.

## 2023-02-19 NOTE — Telephone Encounter (Signed)
Percocet refill request 

## 2023-02-26 ENCOUNTER — Telehealth: Payer: Self-pay | Admitting: Family Medicine

## 2023-02-26 DIAGNOSIS — F1190 Opioid use, unspecified, uncomplicated: Secondary | ICD-10-CM

## 2023-02-26 DIAGNOSIS — D571 Sickle-cell disease without crisis: Secondary | ICD-10-CM

## 2023-02-26 MED ORDER — OXYCODONE-ACETAMINOPHEN 10-325 MG PO TABS: 1.0000 | ORAL_TABLET | ORAL | 0 refills | Status: DC | PRN

## 2023-02-26 NOTE — Telephone Encounter (Signed)
Caller & Relationship to patient:  MRN #  [id number]   Call Back Number:   Date of Last Office Visit: 02/19/2023     Date of Next Office Visit: 04/20/2023    Medication(s) to be Refilled: Percocet  Preferred Pharmacy:   ** Please notify patient to allow 48-72 hours to process** **Let patient know to contact pharmacy at the end of the day to make sure medication is ready. ** **If patient has not been seen in a year or longer, book an appointment **Advise to use MyChart for refill requests OR to contact their pharmacy

## 2023-02-26 NOTE — Telephone Encounter (Signed)
Reviewed PDMP substance reporting system prior to prescribing opiate medications. No inconsistencies noted.  Meds ordered this encounter  Medications   oxyCODONE-acetaminophen (PERCOCET) 10-325 MG tablet    Sig: Take 1 tablet by mouth every 4 hours as needed for pain.    Dispense:  90 tablet    Refill:  0    Order Specific Question:   Supervising Provider    Answer:   JEGEDE, OLUGBEMIGA E [[id number]]   Martin Gomer Moore Shivank Pinedo  APRN, [name], [name] [facility] [address]  [place], [place] [address] [phone]  

## 2023-03-05 ENCOUNTER — Telehealth: Payer: Self-pay | Admitting: Family Medicine

## 2023-03-05 NOTE — Telephone Encounter (Signed)
Oxycodone refill request.

## 2023-03-09 ENCOUNTER — Telehealth: Payer: Self-pay | Admitting: Family Medicine

## 2023-03-15 ENCOUNTER — Inpatient Hospital Stay (HOSPITAL_COMMUNITY)
Admission: EM | Admit: 2023-03-15 | Discharge: 2023-03-17 | DRG: 812 | Disposition: A | Discharge status: Home / Self Care | Payer: Self-pay | Attending: Internal Medicine | Admitting: Internal Medicine

## 2023-03-15 ENCOUNTER — Encounter (HOSPITAL_COMMUNITY): Payer: Self-pay

## 2023-03-15 ENCOUNTER — Other Ambulatory Visit: Payer: Self-pay

## 2023-03-15 DIAGNOSIS — F1120 Opioid dependence, uncomplicated: Secondary | ICD-10-CM | POA: Diagnosis present

## 2023-03-15 DIAGNOSIS — Z888 Allergy status to other drugs, medicaments and biological substances status: Secondary | ICD-10-CM

## 2023-03-15 DIAGNOSIS — Z832 Family history of diseases of the blood and blood-forming organs and certain disorders involving the immune mechanism: Secondary | ICD-10-CM

## 2023-03-15 DIAGNOSIS — D649 Anemia, unspecified: Secondary | ICD-10-CM

## 2023-03-15 DIAGNOSIS — F419 Anxiety disorder, unspecified: Secondary | ICD-10-CM | POA: Diagnosis present

## 2023-03-15 DIAGNOSIS — Z86711 Personal history of pulmonary embolism: Secondary | ICD-10-CM | POA: Diagnosis present

## 2023-03-15 DIAGNOSIS — K219 Gastro-esophageal reflux disease without esophagitis: Secondary | ICD-10-CM | POA: Diagnosis present

## 2023-03-15 DIAGNOSIS — Z21 Asymptomatic human immunodeficiency virus [HIV] infection status: Secondary | ICD-10-CM | POA: Insufficient documentation

## 2023-03-15 DIAGNOSIS — F1190 Opioid use, unspecified, uncomplicated: Secondary | ICD-10-CM

## 2023-03-15 DIAGNOSIS — G894 Chronic pain syndrome: Secondary | ICD-10-CM | POA: Diagnosis present

## 2023-03-15 DIAGNOSIS — D5700 Hb-SS disease with crisis, unspecified: Principal | ICD-10-CM | POA: Diagnosis present

## 2023-03-15 DIAGNOSIS — Z7901 Long term (current) use of anticoagulants: Secondary | ICD-10-CM

## 2023-03-15 DIAGNOSIS — Z79899 Other long term (current) drug therapy: Secondary | ICD-10-CM

## 2023-03-15 DIAGNOSIS — D571 Sickle-cell disease without crisis: Secondary | ICD-10-CM | POA: Diagnosis present

## 2023-03-15 DIAGNOSIS — I959 Hypotension, unspecified: Secondary | ICD-10-CM | POA: Diagnosis present

## 2023-03-15 DIAGNOSIS — R7401 Elevation of levels of liver transaminase levels: Secondary | ICD-10-CM | POA: Diagnosis present

## 2023-03-15 DIAGNOSIS — D539 Nutritional anemia, unspecified: Secondary | ICD-10-CM | POA: Diagnosis present

## 2023-03-15 DIAGNOSIS — E876 Hypokalemia: Secondary | ICD-10-CM | POA: Insufficient documentation

## 2023-03-15 NOTE — ED Triage Notes (Signed)
Pt came POV with c/o sickle cell crisis; pt reported having generalized pain.

## 2023-03-16 ENCOUNTER — Encounter (HOSPITAL_COMMUNITY): Payer: Self-pay | Admitting: Internal Medicine

## 2023-03-16 ENCOUNTER — Other Ambulatory Visit: Payer: Self-pay | Admitting: Family Medicine

## 2023-03-16 DIAGNOSIS — Z86711 Personal history of pulmonary embolism: Secondary | ICD-10-CM

## 2023-03-16 DIAGNOSIS — D5700 Hb-SS disease with crisis, unspecified: Secondary | ICD-10-CM

## 2023-03-16 DIAGNOSIS — E876 Hypokalemia: Secondary | ICD-10-CM | POA: Insufficient documentation

## 2023-03-16 DIAGNOSIS — K219 Gastro-esophageal reflux disease without esophagitis: Secondary | ICD-10-CM

## 2023-03-16 DIAGNOSIS — Z21 Asymptomatic human immunodeficiency virus [HIV] infection status: Secondary | ICD-10-CM | POA: Insufficient documentation

## 2023-03-16 DIAGNOSIS — D649 Anemia, unspecified: Secondary | ICD-10-CM

## 2023-03-16 LAB — CBC WITH DIFFERENTIAL/PLATELET
Abs Immature Granulocytes: 0.03 10*3/uL (ref 0.00–0.07)
Basophils Absolute: 0 10*3/uL (ref 0.0–0.1)
Basophils Relative: 0 %
Eosinophils Absolute: 0.1 10*3/uL (ref 0.0–0.5)
Eosinophils Relative: 1 %
HCT: 17 % — ABNORMAL LOW (ref 39.0–52.0)
Hemoglobin: 6.9 g/dL — CL (ref 13.0–17.0)
Immature Granulocytes: 0 %
Lymphocytes Relative: 61 %
Lymphs Abs: 5.2 10*3/uL — ABNORMAL HIGH (ref 0.7–4.0)
MCH: 43.1 pg — ABNORMAL HIGH (ref 26.0–34.0)
MCHC: 40.6 g/dL — ABNORMAL HIGH (ref 30.0–36.0)
MCV: 106.3 fL — ABNORMAL HIGH (ref 80.0–100.0)
Monocytes Absolute: 1.2 10*3/uL — ABNORMAL HIGH (ref 0.1–1.0)
Monocytes Relative: 14 %
Neutro Abs: 2.1 10*3/uL (ref 1.7–7.7)
Neutrophils Relative %: 24 %
Platelets: 151 10*3/uL (ref 150–400)
RBC: 1.6 MIL/uL — ABNORMAL LOW (ref 4.22–5.81)
RDW: 111.4 % — ABNORMAL HIGH (ref 11.5–15.5)
WBC: 8.6 10*3/uL (ref 4.0–10.5)
nRBC: 37.9 % — ABNORMAL HIGH (ref 0.0–0.2)

## 2023-03-16 LAB — HEPATIC FUNCTION PANEL
ALT: 11 U/L (ref 0–44)
AST: 19 U/L (ref 15–41)
Albumin: 3.8 g/dL (ref 3.5–5.0)
Alkaline Phosphatase: 47 U/L (ref 38–126)
Bilirubin, Direct: 0.2 mg/dL (ref 0.0–0.2)
Indirect Bilirubin: 6.5 mg/dL — ABNORMAL HIGH (ref 0.3–0.9)
Total Bilirubin: 6.7 mg/dL — ABNORMAL HIGH (ref <1.2)
Total Protein: 7.5 g/dL (ref 6.5–8.1)

## 2023-03-16 LAB — BASIC METABOLIC PANEL
Anion gap: 10 (ref 5–15)
BUN: 13 mg/dL (ref 6–20)
CO2: 24 mmol/L (ref 22–32)
Calcium: 8.5 mg/dL — ABNORMAL LOW (ref 8.9–10.3)
Chloride: 106 mmol/L (ref 98–111)
Creatinine, Ser: 0.81 mg/dL (ref 0.61–1.24)
GFR, Estimated: >60 mL/min (ref >60)
Glucose, Bld: 106 mg/dL — ABNORMAL HIGH (ref 70–99)
Potassium: 3.4 mmol/L — ABNORMAL LOW (ref 3.5–5.1)
Sodium: 140 mmol/L (ref 135–145)

## 2023-03-16 LAB — PREPARE RBC (CROSSMATCH)

## 2023-03-16 LAB — RETICULOCYTES
Immature Retic Fract: 36.3 % — ABNORMAL HIGH (ref 2.3–15.9)
RBC.: 1.64 MIL/uL — ABNORMAL LOW (ref 4.22–5.81)
Retic Count, Absolute: 262.8 10*3/uL — ABNORMAL HIGH (ref 19.0–186.0)
Retic Ct Pct: 16 % — ABNORMAL HIGH (ref 0.4–3.1)

## 2023-03-16 MED ORDER — HYDROXYUREA 500 MG PO CAPS
2000.0000 mg | ORAL_CAPSULE | Freq: Every day | ORAL | Status: DC
  Administered 2023-03-16: 2000 mg via ORAL
  Filled 2023-03-16 (×2): qty 4

## 2023-03-16 MED ORDER — SENNOSIDES-DOCUSATE SODIUM 8.6-50 MG PO TABS
1.0000 | ORAL_TABLET | Freq: Two times a day (BID) | ORAL | Status: DC
  Filled 2023-03-16 (×3): qty 1

## 2023-03-16 MED ORDER — DIPHENHYDRAMINE HCL 25 MG PO CAPS
25.0000 mg | ORAL_CAPSULE | ORAL | Status: DC | PRN
  Filled 2023-03-16: qty 1

## 2023-03-16 MED ORDER — SODIUM CHLORIDE 0.9% FLUSH: 3.0000 mL | INTRAVENOUS | Status: DC | PRN

## 2023-03-16 MED ORDER — SODIUM CHLORIDE 0.9% FLUSH: 9.0000 mL | INTRAVENOUS | Status: DC | PRN

## 2023-03-16 MED ORDER — PANTOPRAZOLE SODIUM 40 MG PO TBEC: 40.0000 mg | DELAYED_RELEASE_TABLET | Freq: Two times a day (BID) | ORAL | Status: DC

## 2023-03-16 MED ORDER — DOLUTEGRAVIR SODIUM 50 MG PO TABS
50.0000 mg | ORAL_TABLET | Freq: Every day | ORAL | Status: DC
  Administered 2023-03-16: 50 mg via ORAL
  Filled 2023-03-16 (×2): qty 1

## 2023-03-16 MED ORDER — LACTATED RINGERS IV SOLN: INTRAVENOUS | Status: DC

## 2023-03-16 MED ORDER — FOLIC ACID 1 MG PO TABS
1.0000 mg | ORAL_TABLET | Freq: Every day | ORAL | Status: DC
  Administered 2023-03-16 – 2023-03-17 (×2): 1 mg via ORAL
  Filled 2023-03-16 (×2): qty 1

## 2023-03-16 MED ORDER — LACTATED RINGERS IV BOLUS
1000.0000 mL | Freq: Once | INTRAVENOUS | Status: AC
  Administered 2023-03-16: 1000 mL via INTRAVENOUS

## 2023-03-16 MED ORDER — GABAPENTIN 300 MG PO CAPS: 300.0000 mg | ORAL_CAPSULE | Freq: Every evening | ORAL | Status: DC | PRN

## 2023-03-16 MED ORDER — PANTOPRAZOLE SODIUM 40 MG PO TBEC
40.0000 mg | DELAYED_RELEASE_TABLET | Freq: Every day | ORAL | Status: DC
  Administered 2023-03-16 – 2023-03-17 (×2): 40 mg via ORAL
  Filled 2023-03-16 (×2): qty 1

## 2023-03-16 MED ORDER — POTASSIUM CHLORIDE 20 MEQ PO PACK
20.0000 meq | PACK | Freq: Once | ORAL | Status: AC
  Administered 2023-03-16: 20 meq via ORAL
  Filled 2023-03-16: qty 1

## 2023-03-16 MED ORDER — ONDANSETRON HCL 4 MG/2ML IJ SOLN
4.0000 mg | INTRAMUSCULAR | Status: DC | PRN
  Administered 2023-03-17: 4 mg via INTRAVENOUS
  Filled 2023-03-16: qty 2

## 2023-03-16 MED ORDER — OXYCODONE-ACETAMINOPHEN 5-325 MG PO TABS
1.0000 | ORAL_TABLET | ORAL | Status: DC | PRN
  Administered 2023-03-17: 1 via ORAL
  Filled 2023-03-16: qty 1

## 2023-03-16 MED ORDER — ABACAVIR SULFATE 300 MG PO TABS
600.0000 mg | ORAL_TABLET | Freq: Every day | ORAL | Status: DC
  Filled 2023-03-16: qty 2

## 2023-03-16 MED ORDER — DIPHENHYDRAMINE HCL 50 MG/ML IJ SOLN
25.0000 mg | Freq: Four times a day (QID) | INTRAMUSCULAR | Status: DC | PRN
  Administered 2023-03-16: 25 mg via INTRAVENOUS
  Filled 2023-03-16: qty 1

## 2023-03-16 MED ORDER — DIPHENHYDRAMINE HCL 25 MG PO CAPS: 25.0000 mg | ORAL_CAPSULE | ORAL | Status: DC | PRN

## 2023-03-16 MED ORDER — SODIUM CHLORIDE 0.9 % IV BOLUS (SEPSIS): 500.0000 mL | Freq: Once | INTRAVENOUS | Status: DC

## 2023-03-16 MED ORDER — HYDROMORPHONE HCL 2 MG/ML IJ SOLN
2.0000 mg | INTRAMUSCULAR | Status: AC
  Administered 2023-03-16: 2 mg via INTRAVENOUS
  Filled 2023-03-16: qty 1

## 2023-03-16 MED ORDER — HYDROMORPHONE 1 MG/ML IV SOLN
INTRAVENOUS | Status: AC
  Administered 2023-03-16: 30 mg via INTRAVENOUS
  Filled 2023-03-16: qty 30

## 2023-03-16 MED ORDER — LAMIVUDINE 150 MG PO TABS
300.0000 mg | ORAL_TABLET | Freq: Every day | ORAL | Status: DC
  Administered 2023-03-16: 300 mg via ORAL
  Filled 2023-03-16 (×2): qty 2

## 2023-03-16 MED ORDER — SODIUM CHLORIDE 0.9% IV SOLUTION: Freq: Once | INTRAVENOUS | Status: DC

## 2023-03-16 MED ORDER — NALOXONE HCL 0.4 MG/ML IJ SOLN: 0.4000 mg | INTRAMUSCULAR | Status: DC | PRN

## 2023-03-16 MED ORDER — DIPHENHYDRAMINE HCL 25 MG PO CAPS
25.0000 mg | ORAL_CAPSULE | ORAL | Status: DC | PRN
  Administered 2023-03-16: 25 mg via ORAL
  Filled 2023-03-16: qty 1

## 2023-03-16 MED ORDER — HYDROMORPHONE 1 MG/ML IV SOLN: INTRAVENOUS | Status: DC

## 2023-03-16 MED ORDER — CYCLOBENZAPRINE HCL 10 MG PO TABS: 10.0000 mg | ORAL_TABLET | Freq: Two times a day (BID) | ORAL | Status: DC | PRN

## 2023-03-16 MED ORDER — HYDROMORPHONE HCL 2 MG/ML IJ SOLN
2.0000 mg | Freq: Once | INTRAMUSCULAR | Status: AC
  Administered 2023-03-16: 2 mg via INTRAVENOUS
  Filled 2023-03-16: qty 1

## 2023-03-16 MED ORDER — SODIUM CHLORIDE 0.9% FLUSH
3.0000 mL | Freq: Two times a day (BID) | INTRAVENOUS | Status: DC
  Administered 2023-03-16 (×2): 3 mL via INTRAVENOUS

## 2023-03-16 MED ORDER — HYDROMORPHONE HCL 2 MG/ML IJ SOLN
2.0000 mg | INTRAMUSCULAR | Status: AC
Start: 2023-03-16 — End: 2023-03-16
  Administered 2023-03-16: 2 mg via INTRAVENOUS
  Filled 2023-03-16: qty 1

## 2023-03-16 MED ORDER — CHLORHEXIDINE GLUCONATE CLOTH 2 % EX PADS
6.0000 | MEDICATED_PAD | Freq: Every day | CUTANEOUS | Status: DC
  Administered 2023-03-16 – 2023-03-17 (×2): 6 via TOPICAL

## 2023-03-16 MED ORDER — SODIUM CHLORIDE 0.9 % IV SOLN: 250.0000 mL | INTRAVENOUS | Status: DC | PRN

## 2023-03-16 MED ORDER — OXYCODONE-ACETAMINOPHEN 10-325 MG PO TABS: 1.0000 | ORAL_TABLET | ORAL | Status: DC | PRN

## 2023-03-16 MED ORDER — HYDROMORPHONE 1 MG/ML IV SOLN
INTRAVENOUS | Status: DC
  Administered 2023-03-17: 30 mg via INTRAVENOUS
  Filled 2023-03-16: qty 30

## 2023-03-16 MED ORDER — ACETAMINOPHEN 650 MG RE SUPP: 650.0000 mg | Freq: Four times a day (QID) | RECTAL | Status: DC | PRN

## 2023-03-16 MED ORDER — APIXABAN 5 MG PO TABS
5.0000 mg | ORAL_TABLET | Freq: Two times a day (BID) | ORAL | Status: DC
  Administered 2023-03-16 – 2023-03-17 (×3): 5 mg via ORAL
  Filled 2023-03-16 (×3): qty 1

## 2023-03-16 MED ORDER — ABACAVIR SULFATE 300 MG PO TABS: 600.0000 mg | ORAL_TABLET | Freq: Every day | ORAL | Status: DC

## 2023-03-16 MED ORDER — ACETAMINOPHEN 325 MG PO TABS: 650.0000 mg | ORAL_TABLET | Freq: Four times a day (QID) | ORAL | Status: DC | PRN

## 2023-03-16 MED ORDER — OXYCODONE HCL 5 MG PO TABS: 5.0000 mg | ORAL_TABLET | ORAL | Status: DC | PRN

## 2023-03-16 NOTE — ED Notes (Signed)
ED TO INPATIENT HANDOFF REPORT  ED Nurse Name and Phone #: Charlsie Merles RN   S Name/Age/Gender Martin [name] 32 y.o. male Room/Bed: WA23/WA23  Code Status   Code Status: Full Code  Home/SNF/Other Home Patient oriented to: self, place, time, and situation Is this baseline? Yes   Triage Complete: Triage complete  Chief Complaint Sickle cell pain crisis (HCC) [D57.00]  Triage Note Pt came POV with c/o sickle cell crisis; pt reported having generalized pain.   Allergies Allergies  Allergen Reactions   Ketorolac Tromethamine Swelling and Other (See Comments)    Patient reports facial edema and left arm edema after administration.   Tape Rash and Other (See Comments)    PLEASE DO NOT USE THE CLEAR, THICK, "PLASTIC" TAPE- only paper tape is tolerated    Wound Dressing Adhesive Rash    Level of Care/Admitting Diagnosis ED Disposition     ED Disposition  Admit   Condition  --   Comment  Hospital Area: [facility] [[id number]]  Level of Care: Telemetry [5]  Admit to tele based on following criteria: Other see comments  Comments: Monitor for tachycardia, bradycardia arrhythmia  May admit patient to [facility] or [facility] if equivalent level of care is available:: No  Covid Evaluation: Asymptomatic - no recent exposure (last 10 days) testing not required  Diagnosis: Sickle cell pain crisis [facility]) [[id number]]  Admitting Physician: Tereasa Coop [[id number]]  Attending Physician: Tereasa Coop [[id number]]  Certification:: I certify this patient will need inpatient services for at least 2 midnights  Expected Medical Readiness: 03/20/2023          B Medical/Surgery History Past Medical History:  Diagnosis Date   Anxiety    HIV (human immunodeficiency virus infection) (HCC)    Proteinuria    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HCC)    Vitamin D deficiency 10/2018   Past Surgical History:  Procedure Laterality Date   IR IMAGING GUIDED PORT INSERTION   08/29/2019   IR REMOVAL TUN ACCESS W/ PORT W/O FL MOD SED  08/30/2020   TEE WITHOUT CARDIOVERSION N/A 08/30/2020   Procedure: TRANSESOPHAGEAL ECHOCARDIOGRAM (TEE);  Surgeon: [name], MD;  Location: [facility] ENDOSCOPY;  Service: Cardiovascular;  Laterality: N/A;     A IV Location/Drains/Wounds Patient Lines/Drains/Airways Status     Active Line/Drains/Airways     Name Placement date Placement time Site Days   Implanted Port Right Chest --  --  Chest  --            Intake/Output Last 24 hours No intake or output data in the 24 hours ending 03/16/23 0741  Labs/Imaging Results for orders placed or performed during the hospital encounter of 03/15/23 (from the past 48 hour(s))  CBC WITH DIFFERENTIAL     Status: Abnormal   Collection Time: 03/16/23  1:16 AM  Result Value Ref Range   WBC 8.6 4.0 - 10.5 K/uL   RBC 1.60 (L) 4.22 - 5.81 MIL/uL   Hemoglobin 6.9 (LL) 13.0 - 17.0 g/dL    Comment: CRITICAL RESULT CALLED TO, READ BACK BY AND VERIFIED WITH: BRIANNE, RN 03/16/23  BY K. DAVIS    HCT 17.0 (L) 39.0 - 52.0 %   MCV 106.3 (H) 80.0 - 100.0 fL   MCH 43.1 (H) 26.0 - 34.0 pg   MCHC 40.6 (H) 30.0 - 36.0 g/dL    Comment: Schistocytes present   RDW 111.4 (H) 11.5 - 15.5 %   Platelets 151 150 - 400 K/uL  nRBC 37.9 (H) 0.0 - 0.2 %   Neutrophils Relative % 24 %   Neutro Abs 2.1 1.7 - 7.7 K/uL   Lymphocytes Relative 61 %   Lymphs Abs 5.2 (H) 0.7 - 4.0 K/uL   Monocytes Relative 14 %   Monocytes Absolute 1.2 (H) 0.1 - 1.0 K/uL   Eosinophils Relative 1 %   Eosinophils Absolute 0.1 0.0 - 0.5 K/uL   Basophils Relative 0 %   Basophils Absolute 0.0 0.0 - 0.1 K/uL   RBC Morphology POLYCHROMASIA PRESENT     Comment: Marked sickle cells TARGET CELLS    Immature Granulocytes 0 %   Abs Immature Granulocytes 0.03 0.00 - 0.07 K/uL    Comment: Performed at [facility], [address], [place], [place] [address]  Basic metabolic panel     Status: Abnormal   Collection  Time: 03/16/23  1:16 AM  Result Value Ref Range   Sodium 140 135 - 145 mmol/L   Potassium 3.4 (L) 3.5 - 5.1 mmol/L   Chloride 106 98 - 111 mmol/L   CO2 24 22 - 32 mmol/L   Glucose, Bld 106 (H) 70 - 99 mg/dL    Comment: Glucose reference range applies only to samples taken after fasting for at least 8 hours.   BUN 13 6 - 20 mg/dL   Creatinine, Ser 0.10 0.61 - 1.24 mg/dL   Calcium 8.5 (L) 8.9 - 10.3 mg/dL   GFR, Estimated >93 >23 mL/min    Comment: (NOTE) Calculated using the CKD-EPI Creatinine Equation (2021)    Anion gap 10 5 - 15    Comment: Performed at [facility], [address] [place], [place] [address]  Type and screen [facility]     Status: None   Collection Time: 03/16/23  4:28 AM  Result Value Ref Range   ABO/RH(D) O POS    Antibody Screen NEG    Sample Expiration      03/19/2023,2359 Performed at [facility], [address], [place], [place] [address]   Reticulocytes     Status: Abnormal   Collection Time: 03/16/23  4:28 AM  Result Value Ref Range   Retic Ct Pct 16.0 (H) 0.4 - 3.1 %   RBC. 1.64 (L) 4.22 - 5.81 MIL/uL   Retic Count, Absolute 262.8 (H) 19.0 - 186.0 K/uL   Immature Retic Fract 36.3 (H) 2.3 - 15.9 %    Comment: Performed at [facility], [address], [place], [place] [address]  Hepatic function panel     Status: Abnormal   Collection Time: 03/16/23  4:28 AM  Result Value Ref Range   Total Protein 7.5 6.5 - 8.1 g/dL   Albumin 3.8 3.5 - 5.0 g/dL   AST 19 15 - 41 U/L   ALT 11 0 - 44 U/L   Alkaline Phosphatase 47 38 - 126 U/L   Total Bilirubin 6.7 (H) <1.2 mg/dL   Bilirubin, Direct 0.2 0.0 - 0.2 mg/dL   Indirect Bilirubin 6.5 (H) 0.3 - 0.9 mg/dL    Comment: Performed at [facility], [address], [place], [place] [address]   No results found.  Pending Labs Unresulted Labs (From admission, onward)     Start     Ordered   03/17/23 0500   Comprehensive metabolic panel  Tomorrow morning,   R        03/16/23 0618   03/17/23 0500  CBC  Tomorrow morning,  R        03/16/23 0618   03/16/23 1400  Hemoglobin and hematocrit, blood  Once,   R        03/16/23 0641   03/16/23 0407  Prepare RBC (crossmatch)  (Blood Administration Adult)  Once,   R       Question Answer Comment  # of Units 1 unit   Transfusion Indications Hemoglobin < 7 gm/dL and symptomatic   Number of Units to Keep Ahead NO units ahead   If emergent release call blood bank Not emergent release      03/16/23 0408            Vitals/Pain Today's Vitals   03/16/23 0615 03/16/23 0630 03/16/23 0645 03/16/23 0700  BP: (!) 147/73 100/66 (!) 108/59 (!) 105/59  Pulse: 90 77 64 70  Resp: 14 12 12 13   Temp:   97.7 F (36.5 C)   TempSrc:      SpO2: 90% 92% 92% 94%  Weight:      Height:      PainSc:        Isolation Precautions No active isolations  Medications Medications  ondansetron (ZOFRAN) injection 4 mg (has no administration in time range)  0.9 %  sodium chloride infusion (Manually program via Guardrails IV Fluids) (has no administration in time range)  folic acid (FOLVITE) tablet 1 mg (has no administration in time range)  lactated ringers infusion (has no administration in time range)  oxyCODONE-acetaminophen (PERCOCET) 10-325 MG per tablet 1 tablet (has no administration in time range)  abacavir (ZIAGEN) tablet 600 mg (has no administration in time range)  lamiVUDine (EPIVIR) tablet 300 mg (has no administration in time range)  dolutegravir (TIVICAY) tablet 50 mg (has no administration in time range)  hydroxyurea (HYDREA) capsule 2,000 mg (has no administration in time range)  sodium chloride flush (NS) 0.9 % injection 3 mL (has no administration in time range)  sodium chloride flush (NS) 0.9 % injection 3 mL (has no administration in time range)  0.9 %  sodium chloride infusion (has no administration in time range)  acetaminophen (TYLENOL)  tablet 650 mg (has no administration in time range)    Or  acetaminophen (TYLENOL) suppository 650 mg (has no administration in time range)  senna-docusate (Senokot-S) tablet 1 tablet (has no administration in time range)  naloxone (NARCAN) injection 0.4 mg (has no administration in time range)    And  sodium chloride flush (NS) 0.9 % injection 9 mL (has no administration in time range)  HYDROmorphone (DILAUDID) 1 mg/mL PCA injection (has no administration in time range)  diphenhydrAMINE (BENADRYL) injection 25 mg (has no administration in time range)  apixaban (ELIQUIS) tablet 5 mg (has no administration in time range)  pantoprazole (PROTONIX) EC tablet 40 mg (has no administration in time range)  HYDROmorphone (DILAUDID) injection 2 mg (has no administration in time range)  cyclobenzaprine (FLEXERIL) tablet 10 mg (has no administration in time range)  gabapentin (NEURONTIN) capsule 300 mg (has no administration in time range)  potassium chloride (KLOR-CON) packet 20 mEq (has no administration in time range)  HYDROmorphone (DILAUDID) injection 2 mg (2 mg Intravenous Given 03/16/23 0125)  HYDROmorphone (DILAUDID) injection 2 mg (2 mg Intravenous Given 03/16/23 0225)  HYDROmorphone (DILAUDID) injection 2 mg (2 mg Intravenous Given 03/16/23 0258)  HYDROmorphone (DILAUDID) injection 2 mg (2 mg Intravenous Given 03/16/23 0455)  lactated ringers bolus 1,000 mL (1,000 mLs Intravenous New Bag/Given 03/16/23 0459)    Mobility walks  Focused Assessments Sickle Cell Crisis-Anemia   R Recommendations: See Admitting Provider Note  Report given to:   Additional Notes: .

## 2023-03-16 NOTE — Progress Notes (Signed)
   03/16/23 0938  TOC Brief Assessment  Insurance and Status Lapsed  Patient has primary care physician Yes  Home environment has been reviewed Single family home  Prior level of function: Independent  Prior/Current Home Services No current home services  Social Determinants of Health Reivew SDOH reviewed no interventions necessary  Readmission risk has been reviewed Yes  Transition of care needs no transition of care needs at this time

## 2023-03-16 NOTE — Hospital Course (Addendum)
  32 year old male history of HIV, pulmonary embolism on Eliquis, and sickle cell disease presents to the emergency department evaluation for diffuse body ache.Patient reports that the pain began over the past day and is worsening likely due to the cold weather. No fevers or vomiting. No chest pain or shortness of breath or cough. This feels similar to prior episodes. Patient has been taking home pain medications without relief.  At presentation to ED patient found to be borderline hypotensive and O2 sat 94% on 3 L. CBC showing low hemoglobin 6.9 (baseline hemoglobin around 7-9.8), increased MCV, decreased hematocrit, increased RDW percentages. BMP showing low potassium 3.4.  Patient globin has been dropped 9.8-6.9 over the course of 1 month.  Recommending to hold the Eliquis.  Dr. Bebe Shaggy planning to transfuse 1 unit of blood.  Once hemoglobin would be above 7 will resume the Eliquis.  In the ED patient has been treated with Dilaudid 2 mg x 4 doses.  To continue as needed Dilaudid until patient will be admitted to the floor.  Recommended to give IV fluid LR 1 L bolus followed by IV fluid 125 cc/h and initiating folic acid.  Can resume Eliquis once hemoglobin would be above 7.  Also also continue Dilaudid 2 mg every 2 hour as needed for moderate and severe pain.  Hospitalist has been contacted for further management of sickle cell pain crisis.

## 2023-03-16 NOTE — Plan of Care (Signed)
  Problem: Education: Goal: Awareness of signs and symptoms of anemia will improve 03/16/2023 1612 by Juleen Starr, RN Outcome: Progressing 03/16/2023 1533 by Juleen Starr, RN Outcome: Progressing   Problem: Self-Care: Goal: Ability to incorporate actions that prevent/reduce pain crisis will improve 03/16/2023 1612 by Juleen Starr, RN Outcome: Progressing 03/16/2023 1533 by Juleen Starr, RN Outcome: Progressing   Problem: Sensory: Goal: Pain level will decrease with appropriate interventions 03/16/2023 1612 by Juleen Starr, RN Outcome: Progressing 03/16/2023 1533 by Juleen Starr, RN Outcome: Progressing   Problem: Education: Goal: Knowledge of General Education information will improve Description: Including pain rating scale, medication(s)/side effects and non-pharmacologic comfort measures 03/16/2023 1612 by Juleen Starr, RN Outcome: Progressing 03/16/2023 1533 by Juleen Starr, RN Outcome: Progressing   Problem: Elimination: Goal: Will not experience complications related to bowel motility 03/16/2023 1612 by Juleen Starr, RN Outcome: Progressing 03/16/2023 1533 by Juleen Starr, RN Outcome: Progressing

## 2023-03-16 NOTE — ED Notes (Signed)
Pt is sitting up in bed. Pt is on 02 per his normal at home. Pt requested at sandwich and ginger ale.

## 2023-03-16 NOTE — ED Provider Notes (Signed)
[place] EMERGENCY DEPARTMENT AT [facility] Provider Note   CSN: [id number] Arrival date & time: 03/15/23  2330     History  Chief Complaint  Patient presents with   Sickle Cell Pain Crisis    Martin [name] is a 32 y.o. male.  The history is provided by the patient.  Sickle Cell Pain Crisis Associated symptoms: no chest pain, no cough, no fever and no shortness of breath   Patient with history of sickle cell disease, HIV presents with diffuse body pain.  Patient reports that the pain began over the past day and is worsening likely due to the cold weather.  No fevers or vomiting.  No chest pain or shortness of breath or cough.  This feels similar to prior episodes.  Patient has been taking home pain medications without relief     Past Medical History:  Diagnosis Date   Anxiety    HIV (human immunodeficiency virus infection) (HCC)    Proteinuria    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HCC)    Vitamin D deficiency 10/2018    Home Medications Prior to Admission medications   Medication Sig Start Date End Date Taking? Authorizing Provider  abacavir (ZIAGEN) 300 MG tablet Take 600 mg by mouth at bedtime. 12/30/22   [provider]  apixaban (ELIQUIS) 5 MG TABS tablet Take 1 tablet (5 mg total) by mouth 2 (two) times daily. 12/02/21   Massie Maroon, [name]  cyclobenzaprine (FLEXERIL) 10 MG tablet Take 10 mg by mouth 2 (two) times daily as needed. 12/22/22   [provider]  Deferiprone (FERRIPROX) 500 MG TABS Take 1 tablet by mouth daily. Patient not taking: Reported on 01/16/2023 10/19/22   Massie Maroon, [name]  ergocalciferol (VITAMIN D2) 1.25 MG (50000 UT) capsule Take 1 capsule (50,000 Units total) by mouth once a week. 10/14/22   Massie Maroon, [name]  folic acid (FOLVITE) 1 MG tablet Take 1 mg by mouth daily. 10/10/21   [provider]  gabapentin (NEURONTIN) 300 MG capsule Take 300 mg by mouth at bedtime as needed.    [provider]   hydroxyurea (HYDREA) 500 MG capsule Take 4 capsules (2,000 mg total) by mouth at bedtime. 10/07/20   Massie Maroon, [name]  lamivudine (EPIVIR) 300 MG tablet Take 300 mg by mouth at bedtime. 12/30/22   [provider]  NARCAN 4 MG/0.1ML LIQD nasal spray kit Place 1 spray into the nose once. 01/07/23   [provider]  oxyCODONE-acetaminophen (PERCOCET) 10-325 MG tablet Take 1 tablet by mouth every 4 hours as needed for pain. 02/26/23   Massie Maroon, [name]  pantoprazole (PROTONIX) 40 MG tablet Take 40 mg by mouth daily as needed. 12/25/22   [provider]  TIVICAY 50 MG tablet Take 50 mg by mouth at bedtime. 12/30/22   [provider]  triamcinolone cream (KENALOG) 0.1 % Apply 1 Application topically 2 (two) times daily. 01/12/23   Massie Maroon, [name]      Allergies    Ketorolac tromethamine, Tape, and Wound dressing adhesive    Review of Systems   Review of Systems  Constitutional:  Negative for fever.  Respiratory:  Negative for cough and shortness of breath.   Cardiovascular:  Negative for chest pain.  Gastrointestinal:  Negative for blood in stool.  Genitourinary:  Negative for hematuria.  Musculoskeletal:  Positive for myalgias.  Neurological:  Negative for weakness.    Physical Exam Updated Vital Signs BP 120/61  Pulse 91   Temp 98 F (36.7 C) (Oral)   Resp 15   Ht 1.905 m (6\' 3" )   Wt 63.5 kg   SpO2 90%   BMI 17.50 kg/m  Physical Exam CONSTITUTIONAL: Well developed/well nourished HEAD: Normocephalic/atraumatic ENMT: Mucous membranes moist NECK: supple no meningeal signs CV: S1/S2 noted, no murmurs/rubs/gallops noted LUNGS: Lungs are clear to auscultation bilaterally, no apparent distress On nasal cannula ABDOMEN: soft, nontender NEURO: Pt is awake/alert/appropriate, moves all extremitiesx4.  No facial droop.  No arm or leg drift EXTREMITIES: pulses normal/equal, full ROM, no joint effusions, no deformities SKIN: warm, color  normal PSYCH: no abnormalities of mood noted, alert and oriented to situation  ED Results / Procedures / Treatments   Labs (all labs ordered are listed, but only abnormal results are displayed) Labs Reviewed  CBC WITH DIFFERENTIAL/PLATELET - Abnormal; Notable for the following components:      Result Value   RBC 1.60 (*)    Hemoglobin 6.9 (*)    HCT 17.0 (*)    MCV 106.3 (*)    MCH 43.1 (*)    MCHC 40.6 (*)    RDW 111.4 (*)    nRBC 37.9 (*)    Lymphs Abs 5.2 (*)    Monocytes Absolute 1.2 (*)    All other components within normal limits  BASIC METABOLIC PANEL - Abnormal; Notable for the following components:   Potassium 3.4 (*)    Glucose, Bld 106 (*)    Calcium 8.5 (*)    All other components within normal limits  RETICULOCYTES  HEPATIC FUNCTION PANEL  TYPE AND SCREEN  PREPARE RBC (CROSSMATCH)    EKG None  Radiology No results found.  Procedures .Critical Care  Performed by: Zadie Rhine, MD Authorized by: Zadie Rhine, MD   Critical care provider statement:    Critical care time (minutes):  45   Critical care start time:  03/16/2023 3:15 AM   Critical care end time:  03/16/2023 4:00 AM   Critical care time was exclusive of:  Separately billable procedures and treating other patients   Critical care was necessary to treat or prevent imminent or life-threatening deterioration of the following conditions:  Shock and circulatory failure   Critical care was time spent personally by me on the following activities:  Re-evaluation of patient's condition, pulse oximetry, ordering and review of laboratory studies, ordering and performing treatments and interventions, development of treatment plan with patient or surrogate, evaluation of patient's response to treatment, examination of patient and obtaining history from patient or surrogate   I assumed direction of critical care for this patient from another provider in my specialty: no     Care discussed with:  admitting provider       Medications Ordered in ED Medications  diphenhydrAMINE (BENADRYL) capsule 25-50 mg (25 mg Oral Given 03/16/23 0125)  ondansetron (ZOFRAN) injection 4 mg (has no administration in time range)  0.9 %  sodium chloride infusion (Manually program via Guardrails IV Fluids) (has no administration in time range)  HYDROmorphone (DILAUDID) injection 2 mg (has no administration in time range)  folic acid (FOLVITE) tablet 1 mg (has no administration in time range)  sodium chloride 0.9 % bolus 500 mL (has no administration in time range)  HYDROmorphone (DILAUDID) injection 2 mg (2 mg Intravenous Given 03/16/23 0125)  HYDROmorphone (DILAUDID) injection 2 mg (2 mg Intravenous Given 03/16/23 0225)  HYDROmorphone (DILAUDID) injection 2 mg (2 mg Intravenous Given 03/16/23 0258)    ED Course/ Medical  Decision Making/ A&P Clinical Course as of 03/16/23 0429  Tue Mar 16, 2023  0236 Patient feeling improved but still having pain we will continue to monitor [DW]  [phone] Patient reports pain improved but still not resolved.  Patient currently on home oxygen.  Patient does have signs of acute on chronic anemia.  Patient denies any recent blood loss.  However given the level of hemoglobin and the drop from previous labs, patient would benefit from blood transfusion. Patient agreeable with plan. [DW]  [phone] Discussed with Dr. Janalyn Shy Patient be admitted to the hospitalist service.  She requested start IV fluids, folic acid and blood transfusion [DW]    Clinical Course User Index [DW] Zadie Rhine, MD                                 Medical Decision Making Amount and/or Complexity of Data Reviewed Labs: ordered.  Risk Prescription drug management. Decision regarding hospitalization.   This patient presents to the ED for concern of sickle cell pain crisis, this involves an extensive number of treatment options, and is a complaint that carries with it a high risk of complications  and morbidity.  The differential diagnosis includes but is not limited to vaso-occlusive crisis, acute chest syndrome, pulmonary embolism, rhabdomyolysis, dehydration, renal failure  Comorbidities that complicate the patient evaluation: Patient's presentation is complicated by their history of HIV  Social Determinants of Health: Patient's  multiple ER visits   increases the complexity of managing their presentation  Additional history obtained: Records reviewed previous admission documents  Lab Tests: I Ordered, and personally interpreted labs.  The pertinent results include: Acute anemia   Medicines ordered and prescription drug management: I ordered medication including Dilaudid for pain Reevaluation of the patient after these medicines showed that the patient    improved   Critical Interventions:   admission, pain management, blood transfusion ordered  Consultations Obtained: I requested consultation with the admitting physician triad , and discussed  findings as well as pertinent plan - they recommend: admit  Reevaluation: After the interventions noted above, I reevaluated the patient and found that they have :improved  Complexity of problems addressed: Patient's presentation is most consistent with  acute presentation with potential threat to life or bodily function  Disposition: After consideration of the diagnostic results and the patient's response to treatment,  I feel that the patent would benefit from admission   .           Final Clinical Impression(s) / ED Diagnoses Final diagnoses:  Vaso-occlusive sickle cell crisis (HCC)  Acute anemia    Rx / DC Orders ED Discharge Orders     None         Zadie Rhine, MD 03/16/23 0430

## 2023-03-16 NOTE — H&P (Addendum)
History and Physical    Martin [name]:[id number] DOB: 02-Nov-1990 DOA: 03/15/2023  PCP: Massie Maroon, [name]   Patient coming from: Home   Chief Complaint:  Chief Complaint  Patient presents with   Sickle Cell Pain Crisis   ED TRIAGE note:  HPI:  Martin [name] is a 32 y.o. male with medical history significant of   HIV, pulmonary embolism on Eliquis, and sickle cell disease presents to the emergency department evaluation for diffuse body ache. Patient reports that the pain began over the past day and is worsening likely due to the cold weather.  Patient reported he has pain to bilateral shoulder, bilateral hip and knee joints mostly.  Pain is 8 out of 10 intensity currently after getting IV Dilaudid in the emergency department.  No fevers or vomiting. No chest pain or shortness of breath or cough. This feels similar to prior episodes. Patient has been taking home pain medications without relief.   At presentation to ED patient found to be borderline hypotensive and O2 sat 94% on 3 L. CBC showing low hemoglobin 6.9 (baseline hemoglobin around 7-9.8), increased MCV, decreased hematocrit, increased RDW percentages. BMP showing low potassium 3.4.  Patient globin has been dropped 9.8 to 6.9 over the course of 1 month.  Recommending to hold the Eliquis.  Dr. Bebe Shaggy planning to transfuse 1 unit of blood.  Once hemoglobin would be above 7 will resume the Eliquis.  In the ED patient has been treated with Dilaudid 2 mg x 4 doses.  To continue as needed Dilaudid until patient will be admitted to the floor.  Recommended to give IV fluid LR 1 L bolus followed by IV fluid 125 cc/h and initiating folic acid.  Can resume Eliquis once hemoglobin would be above 7.  Also also continue Dilaudid 2 mg every 2 hour as needed for moderate and severe pain.  Hospitalist has been contacted for further management of sickle cell pain crisis.  Significant labs in the ED: Lab Orders          CBC WITH DIFFERENTIAL         Basic metabolic panel         Reticulocytes         Hepatic function panel         Comprehensive metabolic panel         CBC         Hemoglobin and hematocrit, blood       Review of Systems:  Review of Systems  Constitutional:  Negative for chills, fever, malaise/fatigue and weight loss.  Eyes:  Negative for blurred vision.  Respiratory:  Negative for cough, sputum production, shortness of breath and wheezing.   Cardiovascular:  Negative for chest pain, palpitations and leg swelling.  Gastrointestinal:  Negative for abdominal pain, heartburn, nausea and vomiting.  Musculoskeletal:  Positive for joint pain and myalgias. Negative for back pain, falls and neck pain.  Skin:  Negative for itching and rash.  Neurological:  Negative for dizziness and headaches.  Psychiatric/Behavioral:  The patient is not nervous/anxious.     Past Medical History:  Diagnosis Date   Anxiety    HIV (human immunodeficiency virus infection) (HCC)    Proteinuria    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HCC)    Vitamin D deficiency 10/2018    Past Surgical History:  Procedure Laterality Date   IR IMAGING GUIDED PORT INSERTION  08/29/2019   IR REMOVAL TUN ACCESS W/ PORT W/O FL MOD SED  08/30/2020  TEE WITHOUT CARDIOVERSION N/A 08/30/2020   Procedure: TRANSESOPHAGEAL ECHOCARDIOGRAM (TEE);  Surgeon: [name], MD;  Location: [facility] ENDOSCOPY;  Service: Cardiovascular;  Laterality: N/A;     reports that he has never smoked. He has never used smokeless tobacco. He reports current drug use. Drug: Marijuana. He reports that he does not drink alcohol.  Allergies  Allergen Reactions   Ketorolac Tromethamine Swelling and Other (See Comments)    Patient reports facial edema and left arm edema after administration.   Tape Rash and Other (See Comments)    PLEASE DO NOT USE THE CLEAR, THICK, "PLASTIC" TAPE- only paper tape is tolerated    Wound Dressing Adhesive Rash    Family History  Problem  Relation Age of Onset   Sickle cell trait Mother    Sickle cell trait Father    Birth defects Maternal Grandmother    Birth defects Paternal Grandmother     Prior to Admission medications   Medication Sig Start Date End Date Taking? Authorizing Provider  abacavir (ZIAGEN) 300 MG tablet Take 600 mg by mouth at bedtime. 12/30/22  Yes [provider]  apixaban (ELIQUIS) 5 MG TABS tablet Take 1 tablet (5 mg total) by mouth 2 (two) times daily. 12/02/21  Yes Massie Maroon, [name]  cyclobenzaprine (FLEXERIL) 10 MG tablet Take 10 mg by mouth 2 (two) times daily as needed. 12/22/22  Yes [provider]  ergocalciferol (VITAMIN D2) 1.25 MG (50000 UT) capsule Take 1 capsule (50,000 Units total) by mouth once a week. 10/14/22  Yes Massie Maroon, [name]  folic acid (FOLVITE) 1 MG tablet Take 1 mg by mouth daily. 10/10/21  Yes [provider]  gabapentin (NEURONTIN) 300 MG capsule Take 300 mg by mouth at bedtime as needed.   Yes [provider]  hydroxyurea (HYDREA) 500 MG capsule Take 4 capsules (2,000 mg total) by mouth at bedtime. 10/07/20  Yes Massie Maroon, [name]  lamivudine (EPIVIR) 300 MG tablet Take 300 mg by mouth at bedtime. 12/30/22  Yes [provider]  NARCAN 4 MG/0.1ML LIQD nasal spray kit Place 1 spray into the nose once. 01/07/23  Yes [provider]  oxyCODONE-acetaminophen (PERCOCET) 10-325 MG tablet Take 1 tablet by mouth every 4 hours as needed for pain. 02/26/23  Yes Massie Maroon, [name]  pantoprazole (PROTONIX) 40 MG tablet Take 40 mg by mouth daily as needed. 12/25/22  Yes [provider]  sulfamethoxazole-trimethoprim (BACTRIM DS) 800-160 MG tablet Take 1 tablet by mouth daily. 03/09/23  Yes [provider]  TIVICAY 50 MG tablet Take 50 mg by mouth at bedtime. 12/30/22  Yes [provider]  triamcinolone cream (KENALOG) 0.1 % Apply 1 Application topically 2 (two) times daily. 01/12/23  Yes Massie Maroon, [name]   Deferiprone (FERRIPROX) 500 MG TABS Take 1 tablet by mouth daily. Patient not taking: Reported on 01/16/2023 10/19/22   Massie Maroon, [name]     Physical Exam: Vitals:   03/16/23 0300 03/16/23 0315 03/16/23 0329 03/16/23 0445  BP: 115/66 120/61  135/72  Pulse: 81 91  71  Resp: 12 15  13   Temp:   98 F (36.7 C)   TempSrc:   Oral   SpO2:    94%  Weight:      Height:        Physical Exam Vitals and nursing note reviewed.  Constitutional:      Appearance: Normal appearance.  HENT:     Mouth/Throat:  Mouth: Mucous membranes are dry.  Eyes:     General: Scleral icterus present.  Cardiovascular:     Rate and Rhythm: Normal rate and regular rhythm.     Pulses: Normal pulses.     Heart sounds: Normal heart sounds.  Pulmonary:     Effort: Pulmonary effort is normal.     Comments: Right-sided subclavian port cath site looks clean.  No sign of external erythema or infection. Abdominal:     General: Bowel sounds are normal. There is no distension.     Tenderness: There is no abdominal tenderness. There is no guarding.  Musculoskeletal:        General: No swelling, tenderness, deformity or signs of injury.     Cervical back: Neck supple.     Right lower leg: No edema.     Left lower leg: No edema.  Skin:    General: Skin is dry.     Capillary Refill: Capillary refill takes less than 2 seconds.  Neurological:     Mental Status: He is alert and oriented to person, place, and time.  Psychiatric:        Mood and Affect: Mood normal.        Thought Content: Thought content normal.        Judgment: Judgment normal.      Labs on Admission: I have personally reviewed following labs and imaging studies  CBC: Recent Labs  Lab 03/16/23 0116  WBC 8.6  NEUTROABS 2.1  HGB 6.9*  HCT 17.0*  MCV 106.3*  PLT 151   Basic Metabolic Panel: Recent Labs  Lab 03/16/23 0116  NA 140  K 3.4*  CL 106  CO2 24  GLUCOSE 106*  BUN 13  CREATININE 0.81  CALCIUM 8.5*    GFR: Estimated Creatinine Clearance: 116.5 mL/min (by C-G formula based on SCr of 0.81 mg/dL). Liver Function Tests: Recent Labs  Lab 03/16/23 0428  AST 19  ALT 11  ALKPHOS 47  BILITOT 6.7*  PROT 7.5  ALBUMIN 3.8   No results for input(s): "LIPASE", "AMYLASE" in the last 168 hours. No results for input(s): "AMMONIA" in the last 168 hours. Coagulation Profile: No results for input(s): "INR", "PROTIME" in the last 168 hours. Cardiac Enzymes: No results for input(s): "CKTOTAL", "CKMB", "CKMBINDEX", "TROPONINI", "TROPONINIHS" in the last 168 hours. BNP (last 3 results) No results for input(s): "BNP" in the last 8760 hours. HbA1C: No results for input(s): "HGBA1C" in the last 72 hours. CBG: No results for input(s): "GLUCAP" in the last 168 hours. Lipid Profile: No results for input(s): "CHOL", "HDL", "LDLCALC", "[name]", "CHOLHDL", "LDLDIRECT" in the last 72 hours. Thyroid Function Tests: No results for input(s): "TSH", "T4TOTAL", "FREET4", "T3FREE", "THYROIDAB" in the last 72 hours. Anemia Panel: Recent Labs    03/16/23 0428  RETICCTPCT 16.0*   Urine analysis:    Component Value Date/Time   COLORURINE YELLOW 02/20/2022 0827   APPEARANCEUR CLEAR 02/20/2022 0827   LABSPEC 1.006 02/20/2022 0827   PHURINE 5.0 02/20/2022 0827   GLUCOSEU NEGATIVE 02/20/2022 0827   HGBUR NEGATIVE 02/20/2022 0827   BILIRUBINUR NEGATIVE 02/20/2022 0827   BILIRUBINUR small (A) 03/18/2021 1218   BILIRUBINUR neg 11/27/2019 1149   KETONESUR NEGATIVE 02/20/2022 0827   PROTEINUR NEGATIVE 02/20/2022 0827   UROBILINOGEN 2.0 (A) 03/18/2021 1218   UROBILINOGEN 4.0 (H) 05/21/2009 1944   NITRITE NEGATIVE 02/20/2022 0827   LEUKOCYTESUR NEGATIVE 02/20/2022 0827    Radiological Exams on Admission: I have personally reviewed images No results found.  Assessment/Plan: Principal Problem:   Sickle cell pain crisis (HCC) Active Problems:   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HCC)   Acute on chronic anemia    History of pulmonary embolism   GERD (gastroesophageal reflux disease)   History of HIV infection (HCC)    Assessment and Plan: Sickle cell pain crisis Sickle cell disease > Patient presenting with generalized myalgia and joint pain for last 24 hours.  Pain is not well-controlled with home medication.  Patient [place] any chest pain, shortness of breath, headache and blurry vision. - Hemodynamically stable.  O2 sat 94% on 3 L. CBC showing hemoglobin 6.9, hematocrit 69, MCV 106.  RDW percent is elevated 111 percent.  Patient's baseline hemoglobin around 9. -Physical exam showed right-sided Port-A-Cath.  Patient reported he had a port placed in 2020.  It has been replaced x 3.  He never had an exchange transfusion but reported given he need multiple hospital admission IV fluid and IV pain medication the port has been placed for that reason. - BMP showing low potassium 3.4, hepatic function panel showed elevated ALT bilirubin 6.7 (baseline around 4.6-6.) and elevated direct bilirubin 6.5.  Which reflecting red blood cell destruction in the setting of sickling - Giving 1 unit of blood transfusion.  Repeat hemoglobin 12 PM in the afternoon Pending morning CBC. - In the ED patient received Dilaudid 2 mg x 4 doses - Initiating Dilaudid pump. - Continue Eliquis 5 mg twice daily (history of pulmonary embolism, if hemoglobin drops significantly in that case need to hold Eliquis. -Continue hydroxyurea 2000 mg at bedtime - Continue folic acid 1 mg daily - Continue oxycodone as needed for moderate and severe pain - Continue gabapentin at bedtime and Flexeril twice daily as needed - Continue cardiac monitoring, ETCO2 while on PCA pump, pulse ox and keep supplemental oxygen to keep O2 sat above 94%. -Continue IV Benadryl as needed. -LR 1 L of fluid and continue LR 125 cc/h for 1 day. -Continue cardiac monitoring.  Acute on chronic anemia Macrocytic anemia -Hemoglobin 6.9.  Baseline hemoglobin around 9.   Patient denies any active bleed.  Concern for acute development of anemia in the setting of red blood cells sickling. - Currently transfusing 1 unit of blood.  Continue to check hemoglobin and transfuse as needed keep hemoglobin above 7. -Continue oral folic acid supplement  History pulmonary embolism -Continue Eliquis 5 mg twice daily.  Continue monitor hemoglobin if it drops significantly in that case need to hold Eliquis.  History of HIV infection -Continue Abacavir 600 mg daily, dolutegravir 50 mg daily and lamivudine 300 mg daily.  GERD -Continue Protonix 40 mg daily   Hypokalemia - Low potassium 3.3.  Replating with oral KCl.  DVT prophylaxis:  SCDs.  Deferring pharmacological prophylaxis as low hemoglobin 6.9.  Once hemoglobin  above 7 will resume oral Eliquis. Code Status:  Full Code Diet: Regular diet Family Communication: Currently no family member at bedside now Disposition Plan: Pending improvement of sickle cell pain crisis. Consults: None at this time. Admission status:   Inpatient, Telemetry bed  Severity of Illness: The appropriate patient status for this patient is INPATIENT. Inpatient status is judged to be reasonable and necessary in order to provide the required intensity of service to ensure the patient's safety. The patient's presenting symptoms, physical exam findings, and initial radiographic and laboratory data in the context of their chronic comorbidities is felt to place them at high risk for further clinical deterioration. Furthermore, it is not anticipated that the patient  will be medically stable for discharge from the hospital within 2 midnights of admission.   * I certify that at the point of admission it is my clinical judgment that the patient will require inpatient hospital care spanning beyond 2 midnights from the point of admission due to high intensity of service, high risk for further deterioration and high frequency of surveillance  required.Marland Kitchen    Tereasa Coop, MD [facility]  How to contact the [facility] Attending or Consulting provider 7A - 7P or covering provider during after hours 7P -7A, for this patient.  Check the care team in [facility] and look for a) attending/consulting [facility] provider listed and b) the [facility] team listed Log into [email or web address] and use [place]'s universal password to access. If you do not have the password, please contact the hospital operator. Locate the [facility] provider you are looking for under [facility] and page to a number that you can be directly reached. If you still have difficulty reaching the provider, please page the [facility] (Director on Call) for the Hospitalists listed on amion for assistance.  03/16/2023, 6:49 AM

## 2023-03-16 NOTE — Progress Notes (Signed)
  Martin [name] is a 32 year old male with a medical history significant for sickle cell disease, chronic pain syndrome, opiate dependence and tolerance, history of HIV, and anemia of chronic disease that was admitted overnight for sickle cell pain crisis. Patient continues to have increased allover body pain.  Will continue to follow and care plan:  Continue IV Dilaudid PCA with settings of 0.5 mg, 10-minute lockout, and 3 mg/h Decrease IV fluids to KVO Discontinue IV Benadryl, Benadryl 25 mg p.o. every 4 hours as needed for itching.  Will reassess patient in a.m. and repeat CBC with differential and CMP at that time.   [name]  APRN, [name], [name] Patient Care [facility] [address] [place], [place] [address] [phone]

## 2023-03-17 ENCOUNTER — Other Ambulatory Visit (HOSPITAL_COMMUNITY): Payer: Self-pay

## 2023-03-17 LAB — COMPREHENSIVE METABOLIC PANEL
ALT: 11 U/L (ref 0–44)
AST: 19 U/L (ref 15–41)
Albumin: 3.6 g/dL (ref 3.5–5.0)
Alkaline Phosphatase: 53 U/L (ref 38–126)
Anion gap: 8 (ref 5–15)
BUN: 10 mg/dL (ref 6–20)
CO2: 25 mmol/L (ref 22–32)
Calcium: 8.5 mg/dL — ABNORMAL LOW (ref 8.9–10.3)
Chloride: 107 mmol/L (ref 98–111)
Creatinine, Ser: 0.74 mg/dL (ref 0.61–1.24)
GFR, Estimated: >60 mL/min (ref >60)
Glucose, Bld: 120 mg/dL — ABNORMAL HIGH (ref 70–99)
Potassium: 3.8 mmol/L (ref 3.5–5.1)
Sodium: 140 mmol/L (ref 135–145)
Total Bilirubin: 3.7 mg/dL — ABNORMAL HIGH (ref <1.2)
Total Protein: 7.1 g/dL (ref 6.5–8.1)

## 2023-03-17 LAB — CBC
HCT: 21.6 % — ABNORMAL LOW (ref 39.0–52.0)
Hemoglobin: 7.7 g/dL — ABNORMAL LOW (ref 13.0–17.0)
MCH: 38.7 pg — ABNORMAL HIGH (ref 26.0–34.0)
MCHC: 35.6 g/dL (ref 30.0–36.0)
MCV: 108.5 fL — ABNORMAL HIGH (ref 80.0–100.0)
Platelets: 172 10*3/uL (ref 150–400)
RBC: 1.99 MIL/uL — ABNORMAL LOW (ref 4.22–5.81)
WBC: 8.8 10*3/uL (ref 4.0–10.5)
nRBC: 33.1 % — ABNORMAL HIGH (ref 0.0–0.2)

## 2023-03-17 MED ORDER — SODIUM CHLORIDE 0.9% FLUSH: 10.0000 mL | Freq: Two times a day (BID) | INTRAVENOUS | Status: DC

## 2023-03-17 MED ORDER — HEPARIN SOD (PORK) LOCK FLUSH 100 UNIT/ML IV SOLN
500.0000 [IU] | INTRAVENOUS | Status: AC | PRN
  Administered 2023-03-17: 500 [IU]
  Filled 2023-03-17: qty 5

## 2023-03-17 MED ORDER — OXYCODONE-ACETAMINOPHEN 10-325 MG PO TABS
1.0000 | ORAL_TABLET | ORAL | 0 refills | Status: DC | PRN
  Filled 2023-03-20: qty 90, 15d supply, fill #0

## 2023-03-17 MED ORDER — SODIUM CHLORIDE 0.9% FLUSH: 10.0000 mL | INTRAVENOUS | Status: DC | PRN

## 2023-03-17 NOTE — Progress Notes (Signed)
Pt discharged earlier today but patient began throwing up shortly after discharge.  He stated that he was went to move and get up and started sweating and just started throwing up. He still wanted to go home.  His sister gets off work at [employer] and someone will be home with him then and he could go home then.  Nurse tech gave him a ginger ale and stated that his sister would be here to get him soon.

## 2023-03-17 NOTE — Discharge Summary (Signed)
Physician Discharge Summary  Martin [name] OZD:[id number] DOB: 04-25-1990 DOA: 03/15/2023  PCP: Massie Maroon, [name]  Admit date: 03/15/2023  Discharge date: 03/17/2023  Discharge Diagnoses:  Principal Problem:   Sickle cell pain crisis (HCC) Active Problems:   GERD (gastroesophageal reflux disease)   History of pulmonary embolism   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HCC)   Acute on chronic anemia   History of HIV infection (HCC)   Hypokalemia   Discharge Condition: Stable  Disposition:  Pt is discharged home in good condition and is to follow up with Massie Maroon, [name] this week to have labs evaluated. [name] is instructed to increase activity slowly and balance with rest for the next few days, and use prescribed medication to complete treatment of pain  Diet: Regular Wt Readings from Last 3 Encounters:  03/17/23 73.2 kg  01/28/23 63.5 kg  01/12/23 63.5 kg    History of present illness:  Martin [name] is a 32 year old male with a medical history significant for sickle cell disease, HIV, pulmonary embolism on Eliquis, chronic pain syndrome, and opiate dependence and tolerance.  Patient reports that pain began over the past day and is worsening likely due to the cold weather.  Patient reported that he has pain in his bilateral shoulders, bilateral hips, and knee joints mostly.  Pain is 8 out of 10 currently after getting IV Dilaudid in the emergency department.  No fevers or vomiting.  No chest pain or shortness of breath or cough.  This feels similar to prior episodes.  Patient has been taking home medications without relief. On presentation to the ED, patient found to be borderline hypotensive and oxygen saturation 94% on 3 L.  CBC showing low hemoglobin 6.9 (baseline hemoglobin around 7-9.8) increase in CBC, decrease BMP showing low potassium at 3.4. Patient's hemoglobin dropped from 9.8-6.9 over the course of 1 month.  Recommend to hold the Eliquis.Dr. Bebe Shaggy  planning to transfuse 1 unit PRBCs.  Hospital Course:  Sickle cell disease with pain crisis: Patient was admitted for sickle cell pain crisis and managed appropriately with IVF, IV Dilaudid via PCA , as well as other adjunct therapies per sickle cell pain management protocols.  Patient's pain intensity decreased to 4/10 and he is requesting discharge home. Anemia of chronic disease: On admission, patient's hemoglobin dropped below baseline at 6.9 g/dL.  He was transfused 1 unit PRBCs. Prior to discharge, hemoglobin returned to 7.7, which is consistent with his baseline.  Patient advised to follow-up within 1 week with his PCP to repeat CMP and CBC with differential.  Patient was therefore discharged home today in a hemodynamically stable condition.   Emmette will follow-up with PCP within 1 week of this discharge. [name] was counseled extensively about nonpharmacologic means of pain management, patient verbalized understanding and was appreciative of  the care received during this admission.   We discussed the need for good hydration, monitoring of hydration status, avoidance of heat, cold, stress, and infection triggers. We discussed the need to be adherent with taking Hydrea and other home medications. Patient was reminded of the need to seek medical attention immediately if any symptom of bleeding, anemia, or infection occurs.  Discharge Exam: Vitals:   03/17/23 0735 03/17/23 0925  BP:  109/76  Pulse:  76  Resp: 11   Temp:  98.1 F (36.7 C)  SpO2: 92% 93%   Vitals:   03/17/23 0449 03/17/23 0500 03/17/23 0735 03/17/23 0925  BP: 101/70   109/76  Pulse: 83  76  Resp: 17  11   Temp: 98.3 F (36.8 C)   98.1 F (36.7 C)  TempSrc:      SpO2: 92%  92% 93%  Weight:  73.2 kg    Height:        General appearance : Awake, alert, not in any distress. Speech Clear. Not toxic looking HEENT: Atraumatic and Normocephalic, pupils equally reactive to light and accomodation Neck:  Supple, no JVD. No cervical lymphadenopathy.  Chest: Good air entry bilaterally, no added sounds  CVS: S1 S2 regular, no murmurs.  Abdomen: Bowel sounds present, Non tender and not distended with no gaurding, rigidity or rebound. Extremities: B/L Lower Ext shows no edema, both legs are warm to touch Neurology: Awake alert, and oriented X 3, CN II-XII intact, Non focal Skin: No Rash  Discharge Instructions  Discharge Instructions     Discharge patient   Complete by: As directed    Discharge disposition: 01-Home or Self Care   Discharge patient date: 03/17/2023      Allergies as of 03/17/2023       Reactions   Ketorolac Tromethamine Swelling, Other (See Comments)   Patient reports facial edema and left arm edema after administration.   Tape Rash, Other (See Comments)   PLEASE DO NOT USE THE CLEAR, THICK, "PLASTIC" TAPE- only paper tape is tolerated    Wound Dressing Adhesive Rash        Medication List     TAKE these medications    abacavir 300 MG tablet Commonly known as: ZIAGEN Take 600 mg by mouth at bedtime.   cyclobenzaprine 10 MG tablet Commonly known as: FLEXERIL Take 10 mg by mouth 2 (two) times daily as needed.   Deferiprone 500 MG Tabs Commonly known as: Ferriprox Take 1 tablet by mouth daily.   Eliquis 5 MG Tabs tablet Generic drug: apixaban Take 1 tablet (5 mg total) by mouth 2 (two) times daily.   ergocalciferol 1.25 MG (50000 UT) capsule Commonly known as: VITAMIN D2 Take 1 capsule (50,000 Units total) by mouth once a week.   folic acid 1 MG tablet Commonly known as: FOLVITE Take 1 mg by mouth daily.   gabapentin 300 MG capsule Commonly known as: NEURONTIN Take 300 mg by mouth at bedtime as needed.   hydroxyurea 500 MG capsule Commonly known as: HYDREA Take 4 capsules (2,000 mg total) by mouth at bedtime.   lamivudine 300 MG tablet Commonly known as: EPIVIR Take 300 mg by mouth at bedtime.   Narcan 4 MG/0.1ML Liqd nasal spray  kit Generic drug: naloxone Place 1 spray into the nose once.   oxyCODONE-acetaminophen 10-325 MG tablet Commonly known as: Percocet Take 1 tablet by mouth every 4 hours as needed for pain.   pantoprazole 40 MG tablet Commonly known as: PROTONIX Take 40 mg by mouth daily as needed.   sulfamethoxazole-trimethoprim 800-160 MG tablet Commonly known as: BACTRIM DS Take 1 tablet by mouth daily.   Tivicay 50 MG tablet Generic drug: dolutegravir Take 50 mg by mouth at bedtime.   triamcinolone cream 0.1 % Commonly known as: KENALOG Apply 1 Application topically 2 (two) times daily.        The results of significant diagnostics from this hospitalization (including imaging, microbiology, ancillary and laboratory) are listed below for reference.    Significant Diagnostic Studies: No results found.  Microbiology: No results found for this or any previous visit (from the past 240 hour(s)).   Labs: Basic Metabolic Panel: Recent Labs  Lab 03/16/23 0116 03/17/23 0553  NA 140 140  K 3.4* 3.8  CL 106 107  CO2 24 25  GLUCOSE 106* 120*  BUN 13 10  CREATININE 0.81 0.74  CALCIUM 8.5* 8.5*   Liver Function Tests: Recent Labs  Lab 03/16/23 0428 03/17/23 0553  AST 19 19  ALT 11 11  ALKPHOS 47 53  BILITOT 6.7* 3.7*  PROT 7.5 7.1  ALBUMIN 3.8 3.6   No results for input(s): "LIPASE", "AMYLASE" in the last 168 hours. No results for input(s): "AMMONIA" in the last 168 hours. CBC: Recent Labs  Lab 03/16/23 0116 03/17/23 0553  WBC 8.6 8.8  NEUTROABS 2.1  --   HGB 6.9* 7.7*  HCT 17.0* 21.6*  MCV 106.3* 108.5*  PLT 151 172   Cardiac Enzymes: No results for input(s): "CKTOTAL", "CKMB", "CKMBINDEX", "TROPONINI" in the last 168 hours. BNP: Invalid input(s): "POCBNP" CBG: No results for input(s): "GLUCAP" in the last 168 hours.  Time coordinating discharge: 30 minutes  Signed: [name]  APRN, [name], [name] Patient Care [facility] [address] [place], [place] [address] [phone]  [facility] 03/17/2023, 12:19 PM

## 2023-03-17 NOTE — Plan of Care (Signed)
  Problem: Education: Goal: Awareness of infection prevention will improve Outcome: Progressing   Problem: Education: Goal: Awareness of signs and symptoms of anemia will improve Outcome: Progressing   Problem: Education: Goal: Long-term complications will improve Outcome: Progressing   Problem: Bowel/Gastric: Goal: Gut motility will be maintained Outcome: Progressing   Problem: Tissue Perfusion: Goal: Complications related to inadequate tissue perfusion will be avoided or minimized Outcome: Progressing

## 2023-03-17 NOTE — Progress Notes (Signed)
Call to pt's sister to check ETA- pt called from his phone on speaker w/ nurse in room. Pt's sister enroute- should be here by 1915. Pt's sister states she will call when she arrives. Primary nurse updated.

## 2023-03-17 NOTE — Progress Notes (Signed)
Pt medicated for nausea. PIV removed afterward as noted. Pt is going to take an uber home. AVS reviewed w/ pt - no other questions at this time. Pt will pick up percocet from [facility] on Sat . Hours reviewed w/ pt.

## 2023-03-19 ENCOUNTER — Telehealth: Payer: Self-pay

## 2023-03-19 LAB — TYPE AND SCREEN
ABO/RH(D): O POS
Antibody Screen: NEGATIVE
Donor AG Type: NEGATIVE
Unit division: 0

## 2023-03-19 LAB — BPAM RBC
Blood Product Expiration Date: 202412162359
ISSUE DATE / TIME: 202411261216
Unit Type and Rh: 9500

## 2023-03-19 NOTE — Transitions of Care (Post Inpatient/ED Visit) (Signed)
   03/19/2023  Name: Martin [name] MRN: [id number] DOB: [birth year]  Today's TOC FU Call Status: Today's TOC FU Call Status:: Unsuccessful Call (1st Attempt) Unsuccessful Call (1st Attempt) Date: 03/19/23  Attempted to reach the patient regarding the most recent Inpatient/ED visit.  Follow Up Plan: Additional outreach attempts will be made to reach the patient to complete the Transitions of Care (Post Inpatient/ED visit) call.   Luv Mish Daphine Deutscher BSN, RN RN Care Manager   Transitions of Care [facility] - [facility] Direct Dial Number:  [phone]

## 2023-03-20 ENCOUNTER — Other Ambulatory Visit (HOSPITAL_COMMUNITY): Payer: Self-pay

## 2023-03-22 ENCOUNTER — Telehealth: Payer: Self-pay

## 2023-03-22 NOTE — Transitions of Care (Post Inpatient/ED Visit) (Signed)
   03/22/2023  Name: Martin [name] MRN: [id number] DOB: 08-20-1990  Today's TOC FU Call Status:    Attempted to reach the patient regarding the most recent Inpatient/ED visit.  Follow Up Plan: Additional outreach attempts will be made to reach the patient to complete the Transitions of Care (Post Inpatient/ED visit) call.   Naoma Boxell Daphine Deutscher BSN, RN RN Care Manager   Transitions of Care [facility] - [facility] Direct Dial Number:  [phone]

## 2023-03-23 ENCOUNTER — Telehealth: Payer: Self-pay

## 2023-03-23 DIAGNOSIS — B20 Human immunodeficiency virus [HIV] disease: Secondary | ICD-10-CM | POA: Insufficient documentation

## 2023-03-23 DIAGNOSIS — Z7901 Long term (current) use of anticoagulants: Secondary | ICD-10-CM | POA: Insufficient documentation

## 2023-03-23 DIAGNOSIS — D5700 Hb-SS disease with crisis, unspecified: Secondary | ICD-10-CM | POA: Insufficient documentation

## 2023-03-23 DIAGNOSIS — Z86711 Personal history of pulmonary embolism: Secondary | ICD-10-CM | POA: Insufficient documentation

## 2023-03-23 DIAGNOSIS — Z79899 Other long term (current) drug therapy: Secondary | ICD-10-CM | POA: Insufficient documentation

## 2023-03-23 NOTE — Transitions of Care (Post Inpatient/ED Visit) (Signed)
   03/23/2023  Name: Martin [name] MRN: [id number] DOB: 1990-09-13  Today's TOC FU Call Status: Today's TOC FU Call Status:: Unsuccessful Call (3rd Attempt) Unsuccessful Call (3rd Attempt) Date: 03/23/23  Attempted to reach the patient regarding the most recent Inpatient/ED visit.  Follow Up Plan: No further outreach attempts will be made at this time. We have been unable to contact the patient.  Athalee Esterline Daphine Deutscher BSN, RN RN Care Manager   Transitions of Care [facility] - [facility] Direct Dial Number:  [phone]

## 2023-03-24 ENCOUNTER — Encounter (HOSPITAL_COMMUNITY): Payer: Self-pay

## 2023-03-24 ENCOUNTER — Other Ambulatory Visit: Payer: Self-pay

## 2023-03-24 ENCOUNTER — Emergency Department (HOSPITAL_COMMUNITY)
Admission: EM | Admit: 2023-03-24 | Discharge: 2023-03-24 | Discharge status: Against Medical Advice | Payer: Self-pay | Attending: Emergency Medicine | Admitting: Emergency Medicine

## 2023-03-24 ENCOUNTER — Telehealth (HOSPITAL_COMMUNITY): Payer: Self-pay

## 2023-03-24 ENCOUNTER — Emergency Department (HOSPITAL_COMMUNITY): Payer: Self-pay

## 2023-03-24 DIAGNOSIS — D5700 Hb-SS disease with crisis, unspecified: Principal | ICD-10-CM | POA: Diagnosis present

## 2023-03-24 LAB — CBC WITH DIFFERENTIAL/PLATELET
Abs Immature Granulocytes: 0.05 10*3/uL (ref 0.00–0.07)
Basophils Absolute: 0 10*3/uL (ref 0.0–0.1)
Basophils Relative: 0 %
Eosinophils Absolute: 0.3 10*3/uL (ref 0.0–0.5)
Eosinophils Relative: 3 %
HCT: 24.7 % — ABNORMAL LOW (ref 39.0–52.0)
Hemoglobin: 8.7 g/dL — ABNORMAL LOW (ref 13.0–17.0)
Immature Granulocytes: 1 %
Lymphocytes Relative: 40 %
Lymphs Abs: 3.9 10*3/uL (ref 0.7–4.0)
MCH: 37.3 pg — ABNORMAL HIGH (ref 26.0–34.0)
MCHC: 35.2 g/dL (ref 30.0–36.0)
MCV: 106 fL — ABNORMAL HIGH (ref 80.0–100.0)
Monocytes Absolute: 0.9 10*3/uL (ref 0.1–1.0)
Monocytes Relative: 9 %
Neutro Abs: 4.6 10*3/uL (ref 1.7–7.7)
Neutrophils Relative %: 47 %
Platelets: 358 10*3/uL (ref 150–400)
RBC: 2.33 MIL/uL — ABNORMAL LOW (ref 4.22–5.81)
RDW: 29 % — ABNORMAL HIGH (ref 11.5–15.5)
WBC: 9.7 10*3/uL (ref 4.0–10.5)
nRBC: 42.7 % — ABNORMAL HIGH (ref 0.0–0.2)

## 2023-03-24 LAB — RETICULOCYTES
RBC.: 1.42 MIL/uL — ABNORMAL LOW (ref 4.22–5.81)
Retic Ct Pct: >30 % — ABNORMAL HIGH (ref 0.4–3.1)

## 2023-03-24 LAB — COMPREHENSIVE METABOLIC PANEL
ALT: 15 U/L (ref 0–44)
AST: 19 U/L (ref 15–41)
Albumin: 3.9 g/dL (ref 3.5–5.0)
Alkaline Phosphatase: 58 U/L (ref 38–126)
Anion gap: 6 (ref 5–15)
BUN: 9 mg/dL (ref 6–20)
CO2: 24 mmol/L (ref 22–32)
Calcium: 8.4 mg/dL — ABNORMAL LOW (ref 8.9–10.3)
Chloride: 109 mmol/L (ref 98–111)
Creatinine, Ser: 0.69 mg/dL (ref 0.61–1.24)
GFR, Estimated: >60 mL/min (ref >60)
Glucose, Bld: 88 mg/dL (ref 70–99)
Potassium: 3.5 mmol/L (ref 3.5–5.1)
Sodium: 139 mmol/L (ref 135–145)
Total Bilirubin: 6.6 mg/dL — ABNORMAL HIGH (ref <1.2)
Total Protein: 7.4 g/dL (ref 6.5–8.1)

## 2023-03-24 MED ORDER — HYDROMORPHONE HCL 1 MG/ML IJ SOLN
1.0000 mg | INTRAMUSCULAR | Status: DC | PRN
  Administered 2023-03-24: 1 mg via INTRAVENOUS
  Filled 2023-03-24: qty 1

## 2023-03-24 MED ORDER — HYDROMORPHONE HCL 2 MG/ML IJ SOLN
2.0000 mg | INTRAMUSCULAR | Status: AC
  Administered 2023-03-24: 2 mg via INTRAVENOUS
  Filled 2023-03-24: qty 1

## 2023-03-24 MED ORDER — HYDROMORPHONE HCL 1 MG/ML IJ SOLN
1.0000 mg | Freq: Once | INTRAMUSCULAR | Status: AC
  Administered 2023-03-24: 1 mg via INTRAVENOUS
  Filled 2023-03-24: qty 1

## 2023-03-24 MED ORDER — SODIUM CHLORIDE 0.9 % IV SOLN
12.5000 mg | Freq: Once | INTRAVENOUS | Status: AC
  Administered 2023-03-24: 12.5 mg via INTRAVENOUS
  Filled 2023-03-24: qty 12.5

## 2023-03-24 MED ORDER — HEPARIN SOD (PORK) LOCK FLUSH 100 UNIT/ML IV SOLN
500.0000 [IU] | Freq: Once | INTRAVENOUS | Status: AC
  Administered 2023-03-24: 500 [IU]
  Filled 2023-03-24: qty 5

## 2023-03-24 NOTE — ED Provider Notes (Signed)
[place] EMERGENCY DEPARTMENT AT [facility] Provider Note   CSN: [id number] Arrival date & time: 03/23/23  2354     History  Chief Complaint  Patient presents with   Sickle Cell Pain Crisis    Martin [name] is a 32 y.o. male.  32 year old male with history of sickle cell disease presents today for concern of sickle cell pain crisis.  He reports pain all over.  Denies any chest pain, shortness of breath, cough.  States this is typical for his sickle cell pain crisis.  He took his oxycodone most recently around midnight without any relief.  No other complaints.  The history is provided by the patient. No language interpreter was used.       Home Medications Prior to Admission medications   Medication Sig Start Date End Date Taking? Authorizing Provider  abacavir (ZIAGEN) 300 MG tablet Take 600 mg by mouth at bedtime. 12/30/22   [provider]  apixaban (ELIQUIS) 5 MG TABS tablet Take 1 tablet (5 mg total) by mouth 2 (two) times daily. 12/02/21   Massie Maroon, [name]  cyclobenzaprine (FLEXERIL) 10 MG tablet Take 10 mg by mouth 2 (two) times daily as needed. 12/22/22   [provider]  Deferiprone (FERRIPROX) 500 MG TABS Take 1 tablet by mouth daily. Patient not taking: Reported on 01/16/2023 10/19/22   Massie Maroon, [name]  ergocalciferol (VITAMIN D2) 1.25 MG (50000 UT) capsule Take 1 capsule (50,000 Units total) by mouth once a week. 10/14/22   Massie Maroon, [name]  folic acid (FOLVITE) 1 MG tablet Take 1 mg by mouth daily. 10/10/21   [provider]  gabapentin (NEURONTIN) 300 MG capsule Take 300 mg by mouth at bedtime as needed.    [provider]  hydroxyurea (HYDREA) 500 MG capsule Take 4 capsules (2,000 mg total) by mouth at bedtime. 10/07/20   Massie Maroon, [name]  lamivudine (EPIVIR) 300 MG tablet Take 300 mg by mouth at bedtime. 12/30/22   [provider]  NARCAN 4 MG/0.1ML LIQD nasal spray kit Place 1 spray  into the nose once. 01/07/23   [provider]  oxyCODONE-acetaminophen (PERCOCET) 10-325 MG tablet Take 1 tablet by mouth every 4 hours as needed for pain. 03/20/23   Massie Maroon, [name]  pantoprazole (PROTONIX) 40 MG tablet Take 40 mg by mouth daily as needed. 12/25/22   [provider]  sulfamethoxazole-trimethoprim (BACTRIM DS) 800-160 MG tablet Take 1 tablet by mouth daily. 03/09/23   [provider]  TIVICAY 50 MG tablet Take 50 mg by mouth at bedtime. 12/30/22   [provider]  triamcinolone cream (KENALOG) 0.1 % Apply 1 Application topically 2 (two) times daily. 01/12/23   Massie Maroon, [name]      Allergies    Ketorolac tromethamine, Tape, and Wound dressing adhesive    Review of Systems   Review of Systems  Constitutional:  Negative for chills and fever.  Respiratory:  Negative for shortness of breath.   Cardiovascular:  Negative for chest pain.  Gastrointestinal:  Negative for abdominal pain.  Musculoskeletal:  Positive for myalgias.  Neurological:  Negative for light-headedness.  All other systems reviewed and are negative.   Physical Exam Updated Vital Signs BP 107/76   Pulse 72   Temp 97.6 F (36.4 C) (Oral)   Resp 12   Ht 6\' 3"  (1.905 m)   Wt 63.5 kg   SpO2 94%   BMI 17.50 kg/m  Physical Exam  Vitals and nursing note reviewed.  Constitutional:      General: He is not in acute distress.    Appearance: Normal appearance. He is not ill-appearing.  HENT:     Head: Normocephalic and atraumatic.     Nose: Nose normal.  Eyes:     General: No scleral icterus.    Extraocular Movements: Extraocular movements intact.     Conjunctiva/sclera: Conjunctivae normal.  Cardiovascular:     Rate and Rhythm: Normal rate and regular rhythm.     Pulses: Normal pulses.     Heart sounds: Normal heart sounds.  Pulmonary:     Effort: Pulmonary effort is normal. No respiratory distress.     Breath sounds: Normal breath sounds. No wheezing or  rales.  Abdominal:     General: There is no distension.     Tenderness: There is no abdominal tenderness.  Musculoskeletal:        General: Normal range of motion.     Cervical back: Normal range of motion.  Skin:    General: Skin is warm and dry.  Neurological:     General: No focal deficit present.     Mental Status: He is alert. Mental status is at baseline.     ED Results / Procedures / Treatments   Labs (all labs ordered are listed, but only abnormal results are displayed) Labs Reviewed  COMPREHENSIVE METABOLIC PANEL  CBC WITH DIFFERENTIAL/PLATELET  RETICULOCYTES    EKG None  Radiology No results found.  Procedures .Critical Care  Performed by: Marita [place], PA-C Authorized by: Marita [place], PA-C   Critical care provider statement:    Critical care time (minutes):  34   Critical care was necessary to treat or prevent imminent or life-threatening deterioration of the following conditions:  Respiratory failure (sickle cell pain crisis, hypoxic.)   Critical care was time spent personally by me on the following activities:  Development of treatment plan with patient or surrogate, discussions with consultants, evaluation of patient's response to treatment, examination of patient, ordering and review of laboratory studies, ordering and review of radiographic studies, ordering and performing treatments and interventions, pulse oximetry, re-evaluation of patient's condition and review of old charts     Medications Ordered in ED Medications  HYDROmorphone (DILAUDID) injection 2 mg (has no administration in time range)  HYDROmorphone (DILAUDID) injection 2 mg (has no administration in time range)  HYDROmorphone (DILAUDID) injection 2 mg (has no administration in time range)  diphenhydrAMINE (BENADRYL) 12.5 mg in sodium chloride 0.9 % 50 mL IVPB (has no administration in time range)    ED Course/ Medical Decision Making/ A&P                                 Medical Decision  Making Amount and/or Complexity of Data Reviewed Labs: ordered. Radiology: ordered.  Risk Prescription drug management.   Medical Decision Making / ED Course   This patient presents to the ED for concern of generalized pain, this involves an extensive number of treatment options, and is a complaint that carries with it a high risk of complications and morbidity.  The differential diagnosis includes sickle cell pain crisis, vaso-occlusive crisis  MDM: 32 year old male presents with sickle cell disease presents today with concern for generalized pain.  No chest pain, shortness of breath, or cough.  He states this is typical for her sickle cell pain crisis.  Labs ordered.  Sickle cell pain crisis  order set ordered.  Reticulocyte count significantly elevated.  Above baseline.  CBC without leukocytosis.  Hemoglobin with baseline anemia.  CMP without acute concern.  T. bili elevated but around baseline.  Noted hypoxic O2 sats.  Was not notified of this.  Confirmed with nurse that these were accurate.  He was on room air when I evaluated patient satting in the mid to low 90s.  Requested ambulatory sats.  He did desat to 78% with ambulation.  Did not report any shortness of breath during this ambulation trial.  Will order chest x-ray.  He will require admission.  He is not tachycardic or tachypneic otherwise.  No prior history of PE.  Does have history of acute chest syndrome.  Signed out to oncoming provider follow-up on chest x-ray and discussed with hospitalist for admission.  Discharged in stable condition.   Lab Tests: -I ordered, reviewed, and interpreted labs.   The pertinent results include:   Labs Reviewed  COMPREHENSIVE METABOLIC PANEL - Abnormal; Notable for the following components:      Result Value   Calcium 8.4 (*)    Total Bilirubin 6.6 (*)    All other components within normal limits  CBC WITH DIFFERENTIAL/PLATELET - Abnormal; Notable for the following components:   RBC 2.33  (*)    Hemoglobin 8.7 (*)    HCT 24.7 (*)    MCV 106.0 (*)    MCH 37.3 (*)    RDW 29.0 (*)    nRBC 42.7 (*)    All other components within normal limits  RETICULOCYTES - Abnormal; Notable for the following components:   Retic Ct Pct >30.0 (*)    RBC. 1.42 (*)    All other components within normal limits      EKG  EKG Interpretation Date/Time:  Wednesday March 24 2023 00:05:58 EST Ventricular Rate:  85 PR Interval:  153 QRS Duration:  87 QT Interval:  406 QTC Calculation: 483 R Axis:   89  Text Interpretation: Sinus rhythm Probable left ventricular hypertrophy Borderline prolonged QT interval Confirmed by Alvester Chou [phone]) on 03/24/2023 5:39:49 AM         Imaging Studies ordered: I ordered imaging studies including cxr. Did not result before shift change I independently visualized and interpreted imaging. I agree with the radiologist interpretation   Medicines ordered and prescription drug management: Meds ordered this encounter  Medications   HYDROmorphone (DILAUDID) injection 2 mg   HYDROmorphone (DILAUDID) injection 2 mg   HYDROmorphone (DILAUDID) injection 2 mg   diphenhydrAMINE (BENADRYL) 12.5 mg in sodium chloride 0.9 % 50 mL IVPB    -I have reviewed the patients home medicines and have made adjustments as needed  Critical interventions Pain control, acute hypoxic respiratory failure   Cardiac Monitoring: The patient was maintained on a cardiac monitor.  I personally viewed and interpreted the cardiac monitored which showed an underlying rhythm of: NSR   Reevaluation: After the interventions noted above, I reevaluated the patient and found that they have :stayed the same  Co morbidities that complicate the patient evaluation  Past Medical History:  Diagnosis Date   Anxiety    HIV (human immunodeficiency virus infection) (HCC)    Proteinuria    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HCC)    Vitamin D deficiency 10/2018      Dispostion: Signout to  oncoming provider.  Final Clinical Impression(s) / ED Diagnoses Final diagnoses:  Sickle cell pain crisis (HCC)    Rx / DC Orders ED Discharge Orders  None         Marita [place], PA-C 03/24/23 0636    Terald Sleeper, MD 03/24/23 [phone]

## 2023-03-24 NOTE — ED Notes (Signed)
Patient signing out AMA. Had patient sign electronic AMA form. Stated he was tired of waiting. Patient understands risk of leaving against medical advice. Dr. Jeanann Lewandowsky notified. Deaccessed patients port.

## 2023-03-24 NOTE — Telephone Encounter (Signed)
Pt called day hospital wanting to come in today for sickle cell pain treatment. Pt reports 10/10 generalized pain. Pt reports currently being in the ER and being treated for pain. Pt denies fever, chest pain, N/V/D, abdominal pain, and priapism. Pt screened for COVID and denies symptoms and exposures. Pt advised that since he is currently in ER he should stay there and talk with his team in ER. [place] Hollis, [name] notified and per provider she is admitting pt to inpatient. Pt notified and verbalized understanding.

## 2023-03-24 NOTE — ED Triage Notes (Signed)
Pt states that he is having lower back and bilateral hip pain suggestive of Sickle Cell Crises for him. Pt reports pain worsened today.

## 2023-03-24 NOTE — Discharge Summary (Signed)
Physician Discharge Summary  Martin [name] WJX:[id number] DOB: 10-31-1990 DOA: 03/24/2023  PCP: Massie Maroon, [name]  Admit date: 03/24/2023  Discharge date: 03/26/2023  Discharge Diagnoses:  Principal Problem:   Sickle cell pain crisis [facility])   Discharge Condition: Stable.  Patient left AGAINST MEDICAL ADVICE prior to assessment  Disposition:  Left AGAINST MEDICAL ADVICE Diet: Regular Wt Readings from Last 3 Encounters:  03/24/23 63.5 kg  03/17/23 73.2 kg  01/28/23 63.5 kg    History of present illness:  Patient left AGAINST MEDICAL ADVICE prior to assessment  Hospital Course:  Martin [name] is a 32 year old male with a medical history significant for sickle cell disease, chronic pain syndrome, opiate dependence and tolerance, history of HIV, and history of PE on Xarelto.  Of note, patient is on home oxygen that he does not wear consistently.  Per EDP, patient presented to the emergency department with significant pain that was unrelieved by IV Dilaudid and IV fluids.  Also, noted that patient was hypoxic with oxygen saturation dropping below 90%.  Patient did not arrive with his home oxygen.  Sickle cell team was notified for inpatient admission and patient was accepted.  Admission orders were placed for this patient.  He left prior to the assessment AGAINST MEDICAL ADVICE.  Discharge Exam: Vitals:   03/24/23 1100 03/24/23 1203  BP: 102/69   Pulse: 68   Resp: 18   Temp:  98.4 F (36.9 C)  SpO2: 93%    Vitals:   03/24/23 0555 03/24/23 0805 03/24/23 1100 03/24/23 1203  BP: 113/67 112/87 102/69   Pulse: 84 74 68   Resp: 18 18 18    Temp: 97.6 F (36.4 C) 98.2 F (36.8 C)  98.4 F (36.9 C)  TempSrc: Oral Oral  Oral  SpO2: 93% 93% 93%   Weight:      Height:       Discharge Instructions   Allergies as of 03/24/2023       Reactions   Ketorolac Tromethamine Swelling, Other (See Comments)   Patient reports facial edema and left arm edema after  administration.   Tape Rash, Other (See Comments)   PLEASE DO NOT USE THE CLEAR, THICK, "PLASTIC" TAPE- only paper tape is tolerated    Wound Dressing Adhesive Rash        Medication List     ASK your doctor about these medications    abacavir 300 MG tablet Commonly known as: ZIAGEN Take 600 mg by mouth at bedtime.   cyclobenzaprine 10 MG tablet Commonly known as: FLEXERIL Take 10 mg by mouth 2 (two) times daily as needed.   Deferiprone 500 MG Tabs Commonly known as: Ferriprox Take 1 tablet by mouth daily.   Eliquis 5 MG Tabs tablet Generic drug: apixaban Take 1 tablet (5 mg total) by mouth 2 (two) times daily.   ergocalciferol 1.25 MG (50000 UT) capsule Commonly known as: VITAMIN D2 Take 1 capsule (50,000 Units total) by mouth once a week.   folic acid 1 MG tablet Commonly known as: FOLVITE Take 1 mg by mouth daily.   gabapentin 300 MG capsule Commonly known as: NEURONTIN Take 300 mg by mouth at bedtime as needed.   hydroxyurea 500 MG capsule Commonly known as: HYDREA Take 4 capsules (2,000 mg total) by mouth at bedtime.   lamivudine 300 MG tablet Commonly known as: EPIVIR Take 300 mg by mouth at bedtime.   Narcan 4 MG/0.1ML Liqd nasal spray kit Generic drug: naloxone Place 1 spray into the  nose once.   oxyCODONE-acetaminophen 10-325 MG tablet Commonly known as: Percocet Take 1 tablet by mouth every 4 hours as needed for pain.   pantoprazole 40 MG tablet Commonly known as: PROTONIX Take 40 mg by mouth daily as needed.   sulfamethoxazole-trimethoprim 800-160 MG tablet Commonly known as: BACTRIM DS Take 1 tablet by mouth daily.   Tivicay 50 MG tablet Generic drug: dolutegravir Take 50 mg by mouth at bedtime.   triamcinolone cream 0.1 % Commonly known as: KENALOG Apply 1 Application topically 2 (two) times daily.        The results of significant diagnostics from this hospitalization (including imaging, microbiology, ancillary and laboratory)  are listed below for reference.    Significant Diagnostic Studies: DG Chest 2 View  Result Date: 03/24/2023 CLINICAL DATA:  32 year old male with sickle cell disease. Hypoxia. Sickle cell crisis. EXAM: CHEST - 2 VIEW COMPARISON:  Portable chest 01/28/2023 and earlier. FINDINGS: Right chest power port appears stable, accessed. Stable cardiomegaly and mediastinal contours. Stable somewhat large lung volumes. Coarse bilateral pulmonary interstitial changes appear chronic, stable. No pneumothorax or pleural effusion. Linear increased opacity over the peripheral right lower lung is probably catheter artifact related to the accessed Port-A-Cath. No convincing acute pulmonary opacity. Stable visualized osseous structures. Negative visible bowel gas. IMPRESSION: Chronic lung disease and cardiomegaly compatible with sequelae of sickle cell disease. No acute cardiopulmonary abnormality. Electronically Signed   By: Odessa Fleming M.D.   On: 03/24/2023 06:47    Microbiology: No results found for this or any previous visit (from the past 240 hour(s)).   Labs: Basic Metabolic Panel: Recent Labs  Lab 03/24/23 0232  NA 139  K 3.5  CL 109  CO2 24  GLUCOSE 88  BUN 9  CREATININE 0.69  CALCIUM 8.4*   Liver Function Tests: Recent Labs  Lab 03/24/23 0232  AST 19  ALT 15  ALKPHOS 58  BILITOT 6.6*  PROT 7.4  ALBUMIN 3.9   No results for input(s): "LIPASE", "AMYLASE" in the last 168 hours. No results for input(s): "AMMONIA" in the last 168 hours. CBC: Recent Labs  Lab 03/24/23 0232  WBC 9.7  NEUTROABS 4.6  HGB 8.7*  HCT 24.7*  MCV 106.0*  PLT 358   Cardiac Enzymes: No results for input(s): "CKTOTAL", "CKMB", "CKMBINDEX", "TROPONINI" in the last 168 hours. BNP: Invalid input(s): "POCBNP" CBG: No results for input(s): "GLUCAP" in the last 168 hours.    Signed:  [name]  APRN, [name], [name] Patient Care [facility] [address] [place], [place]  [address] [phone]  [facility] 03/26/2023, 6:29 AM

## 2023-03-24 NOTE — ED Notes (Signed)
Patient transported to X-ray 

## 2023-03-24 NOTE — ED Provider Notes (Cosign Needed)
Patient given in sign out by Marita Kansas, PA-C.  Please review their note for patient HPI, physical exam, workup.  At this time the plan is plan on admission due to suspected sickle cell crisis.  Chest x-ray does not show signs of acute chest.  Previous provider discussed with the patient about the possibility of admission patient was agreeable with this.  Consults: Armenia Hollis, NP Sickle Cell Team  I spoke to the sickle cell team and they will come down to admit the patient.  They state the patient has oxygen at home that he never wears and that is why he is most likely hypoxic in the setting of a negative chest x-ray.  At this time patient stable to be admitted.    Netta Corrigan, [place] 03/24/23 [phone]

## 2023-03-24 NOTE — ED Notes (Signed)
Pt wishes to wait until his port is accessed for bloodwork

## 2023-03-24 NOTE — ED Notes (Signed)
ED TO INPATIENT HANDOFF REPORT  ED Nurse Name and Phone #: Suann Larry Name/Age/Gender Martin [name] 32 y.o. male Room/Bed: WA07/WA07  Code Status   Code Status: Prior  Home/SNF/Other Home Patient oriented to: self, place, time, and situation Is this baseline? Yes   Triage Complete: Triage complete  Chief Complaint Sickle cell pain crisis (HCC) [D57.00]  Triage Note Pt states that he is having lower back and bilateral hip pain suggestive of Sickle Cell Crises for him. Pt reports pain worsened today.    Allergies Allergies  Allergen Reactions   Ketorolac Tromethamine Swelling and Other (See Comments)    Patient reports facial edema and left arm edema after administration.   Tape Rash and Other (See Comments)    PLEASE DO NOT USE THE CLEAR, THICK, "PLASTIC" TAPE- only paper tape is tolerated    Wound Dressing Adhesive Rash    Level of Care/Admitting Diagnosis ED Disposition     ED Disposition  Admit   Condition  --   Comment  Hospital Area[facility] [[id number]]  Level of Care: Med-Surg [16]  May place patient in observation at [facility] or [facility] if equivalent level of care is available:: No  Covid Evaluation: Asymptomatic - no recent exposure (last 10 days) testing not required  Diagnosis: Sickle cell pain crisis [facility]) [[id number]]  Admitting Physician: Massie Maroon [[id number]]  Attending Physician: Quentin Angst [[id number]]          B Medical/Surgery History Past Medical History:  Diagnosis Date   Anxiety    HIV (human immunodeficiency virus infection) (HCC)    Proteinuria    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HCC)    Vitamin D deficiency 10/2018   Past Surgical History:  Procedure Laterality Date   IR IMAGING GUIDED PORT INSERTION  08/29/2019   IR REMOVAL TUN ACCESS W/ PORT W/O FL MOD SED  08/30/2020   TEE WITHOUT CARDIOVERSION N/A 08/30/2020   Procedure: TRANSESOPHAGEAL ECHOCARDIOGRAM (TEE);  Surgeon: [name], MD;   Location: [facility] ENDOSCOPY;  Service: Cardiovascular;  Laterality: N/A;     A IV Location/Drains/Wounds Patient Lines/Drains/Airways Status     Active Line/Drains/Airways     Name Placement date Placement time Site Days   Implanted Port Right Chest --  --  Chest  --            Intake/Output Last 24 hours  Intake/Output Summary (Last 24 hours) at 03/24/2023 1339 Last data filed at 03/24/2023 0447 Gross per 24 hour  Intake 49.29 ml  Output --  Net 49.29 ml    Labs/Imaging Results for orders placed or performed during the hospital encounter of 03/24/23 (from the past 48 hour(s))  Comprehensive metabolic panel     Status: Abnormal   Collection Time: 03/24/23  2:32 AM  Result Value Ref Range   Sodium 139 135 - 145 mmol/L   Potassium 3.5 3.5 - 5.1 mmol/L   Chloride 109 98 - 111 mmol/L   CO2 24 22 - 32 mmol/L   Glucose, Bld 88 70 - 99 mg/dL    Comment: Glucose reference range applies only to samples taken after fasting for at least 8 hours.   BUN 9 6 - 20 mg/dL   Creatinine, Ser 9.62 0.61 - 1.24 mg/dL   Calcium 8.4 (L) 8.9 - 10.3 mg/dL   Total Protein 7.4 6.5 - 8.1 g/dL   Albumin 3.9 3.5 - 5.0 g/dL   AST 19 15 - 41 U/L   ALT 15  0 - 44 U/L   Alkaline Phosphatase 58 38 - 126 U/L   Total Bilirubin 6.6 (H) <1.2 mg/dL   GFR, Estimated >66 >44 mL/min    Comment: (NOTE) Calculated using the CKD-EPI Creatinine Equation (2021)    Anion gap 6 5 - 15    Comment: Performed at [facility], [address], [place], [place] [address]  CBC with Differential     Status: Abnormal   Collection Time: 03/24/23  2:32 AM  Result Value Ref Range   WBC 9.7 4.0 - 10.5 K/uL   RBC 2.33 (L) 4.22 - 5.81 MIL/uL   Hemoglobin 8.7 (L) 13.0 - 17.0 g/dL   HCT 25.9 (L) 56.3 - 87.5 %   MCV 106.0 (H) 80.0 - 100.0 fL   MCH 37.3 (H) 26.0 - 34.0 pg   MCHC 35.2 30.0 - 36.0 g/dL    Comment: CORRECTED FOR COLD AGGLUTININS   RDW 29.0 (H) 11.5 - 15.5 %   Platelets 358 150 - 400 K/uL   nRBC  42.7 (H) 0.0 - 0.2 %   Neutrophils Relative % 47 %   Neutro Abs 4.6 1.7 - 7.7 K/uL   Lymphocytes Relative 40 %   Lymphs Abs 3.9 0.7 - 4.0 K/uL   Monocytes Relative 9 %   Monocytes Absolute 0.9 0.1 - 1.0 K/uL   Eosinophils Relative 3 %   Eosinophils Absolute 0.3 0.0 - 0.5 K/uL   Basophils Relative 0 %   Basophils Absolute 0.0 0.0 - 0.1 K/uL   Immature Granulocytes 1 %   Abs Immature Granulocytes 0.05 0.00 - 0.07 K/uL   Intracellular Hgb Crystals PRESENT    Carollee Massed Bodies PRESENT    Polychromasia PRESENT    Sickle Cells PRESENT    Target Cells PRESENT     Comment: Performed at [facility], [address] [place], [place] [address]  Reticulocytes     Status: Abnormal   Collection Time: 03/24/23  2:32 AM  Result Value Ref Range   Retic Ct Pct >30.0 (H) 0.4 - 3.1 %   RBC. 1.42 (L) 4.22 - 5.81 MIL/uL   Retic Count, Absolute Not Measured 19.0 - 186.0 K/uL   Immature Retic Fract Not Measured 2.3 - 15.9 %    Comment: Performed at [facility], [address], [place], [place] [address]   DG Chest 2 View  Result Date: 03/24/2023 CLINICAL DATA:  32 year old male with sickle cell disease. Hypoxia. Sickle cell crisis. EXAM: CHEST - 2 VIEW COMPARISON:  Portable chest 01/28/2023 and earlier. FINDINGS: Right chest power port appears stable, accessed. Stable cardiomegaly and mediastinal contours. Stable somewhat large lung volumes. Coarse bilateral pulmonary interstitial changes appear chronic, stable. No pneumothorax or pleural effusion. Linear increased opacity over the peripheral right lower lung is probably catheter artifact related to the accessed Port-A-Cath. No convincing acute pulmonary opacity. Stable visualized osseous structures. Negative visible bowel gas. IMPRESSION: Chronic lung disease and cardiomegaly compatible with sequelae of sickle cell disease. No acute cardiopulmonary abnormality. Electronically Signed   By: Odessa Fleming M.D.   On: 03/24/2023  06:47    Pending Labs Unresulted Labs (From admission, onward)    None       Vitals/Pain Today's Vitals   03/24/23 0805 03/24/23 1100 03/24/23 1203 03/24/23 1259  BP: 112/87 102/69    Pulse: 74 68    Resp: 18 18    Temp: 98.2 F (36.8 C)  98.4 F (36.9 C)   TempSrc: Oral  Oral   SpO2: 93% 93%    Weight:      Height:      PainSc:    8     Isolation Precautions No active isolations  Medications Medications  HYDROmorphone (DILAUDID) injection 1 mg (1 mg Intravenous Given 03/24/23 1259)  HYDROmorphone (DILAUDID) injection 2 mg (2 mg Intravenous Given 03/24/23 0248)  HYDROmorphone (DILAUDID) injection 2 mg (2 mg Intravenous Given 03/24/23 0406)  HYDROmorphone (DILAUDID) injection 2 mg (2 mg Intravenous Given 03/24/23 0437)  diphenhydrAMINE (BENADRYL) 12.5 mg in sodium chloride 0.9 % 50 mL IVPB (0 mg Intravenous Stopped 03/24/23 0447)  HYDROmorphone (DILAUDID) injection 1 mg (1 mg Intravenous Given 03/24/23 0801)    Mobility walks with person assist     Focused Assessments Sickle Cell Pain Crisis   R Recommendations: See Admitting Provider Note  Report given to:   Additional Notes: .

## 2023-04-02 ENCOUNTER — Other Ambulatory Visit: Payer: Self-pay | Admitting: Family Medicine

## 2023-04-02 DIAGNOSIS — F1190 Opioid use, unspecified, uncomplicated: Secondary | ICD-10-CM

## 2023-04-02 DIAGNOSIS — D571 Sickle-cell disease without crisis: Secondary | ICD-10-CM

## 2023-04-02 MED ORDER — OXYCODONE-ACETAMINOPHEN 10-325 MG PO TABS: 1.0000 | ORAL_TABLET | ORAL | 0 refills | Status: DC | PRN

## 2023-04-02 NOTE — Telephone Encounter (Unsigned)
Copied from CRM [id number]. Topic: Clinical - Medication Refill >> Apr 02, 2023  2:12 PM Deaijah H wrote: Most Recent Primary Care Visit:  Provider: Massie Maroon  Department: [facility]  Visit Type: OFFICE VISIT  Date: 01/12/2023  Medication: oxyCODONE-acetaminophen (PERCOCET) 10-325 MG tablet  Has the patient contacted their pharmacy? No (Agent: If no, request that the patient contact the pharmacy for the refill. If patient does not wish to contact the pharmacy document the reason why and proceed with request.) (Agent: If yes, when and what did the pharmacy advise?) Stated to speak with the doctor  Is this the correct pharmacy for this prescription? {yes/no:20286} If no, delete pharmacy and type the correct one.  This is the patient's preferred pharmacy:    [id number] - [place], [place] - [address] AT [facility] [address] [place] [address] Phone: [phone] Fax: [phone]     Has the prescription been filled recently? Yes  Is the patient out of the medication? Yes  Has the patient been seen for an appointment in the last year OR does the patient have an upcoming appointment? {yes/no:20286}  Can we respond through MyChart? Yes  Agent: Please be advised that Rx refills may take up to 3 business days. We ask that you follow-up with your pharmacy.

## 2023-04-02 NOTE — Telephone Encounter (Signed)
Please advise KH 

## 2023-04-02 NOTE — Progress Notes (Signed)
Reviewed PDMP substance reporting system prior to prescribing opiate medications. No inconsistencies noted.  Meds ordered this encounter  Medications   oxyCODONE-acetaminophen (PERCOCET) 10-325 MG tablet    Sig: Take 1 tablet by mouth every 4 hours as needed for pain.    Dispense:  90 tablet    Refill:  0    Supervising Provider:   Quentin Angst [[id number]]   [name]  DNP, APRN, [name] Patient Care [facility] [address] [place], [place] [address] [phone]

## 2023-04-08 ENCOUNTER — Other Ambulatory Visit: Payer: Self-pay | Admitting: Family Medicine

## 2023-04-08 DIAGNOSIS — F1190 Opioid use, unspecified, uncomplicated: Secondary | ICD-10-CM

## 2023-04-08 DIAGNOSIS — D571 Sickle-cell disease without crisis: Secondary | ICD-10-CM

## 2023-04-08 NOTE — Telephone Encounter (Signed)
Copied from CRM [id number]. Topic: Clinical - Medication Refill >> Apr 08, 2023  4:16 PM Gildardo Pounds wrote: Most Recent Primary Care Visit:  Provider: Massie Maroon  Department: [facility]  Visit Type: OFFICE VISIT  Date: 01/12/2023  Medication: ***  Has the patient contacted their pharmacy?  (Agent: If no, request that the patient contact the pharmacy for the refill. If patient does not wish to contact the pharmacy document the reason why and proceed with request.) (Agent: If yes, when and what did the pharmacy advise?)  Is this the correct pharmacy for this prescription?  If no, delete pharmacy and type the correct one.  This is the patient's preferred pharmacy:  [facility] [address] [place] [address] Phone: [phone] Fax: [phone]  [facility][id number] - [place], [place] - [address] AT [facility] [address] [place] [address] Phone: [phone] Fax: [phone]  [facility] [phone] - [place], [place] - [address] [place] [address] Phone: [phone] Fax: [phone]   Has the prescription been filled recently?   Is the patient out of the medication?   Has the patient been seen for an appointment in the last year OR does the patient have an upcoming appointment?   Can we respond through MyChart?   Agent: Please be advised that Rx refills may take up to 3 business days. We ask that you follow-up with your pharmacy.

## 2023-04-13 ENCOUNTER — Other Ambulatory Visit (HOSPITAL_COMMUNITY): Payer: Self-pay

## 2023-04-13 MED ORDER — OXYCODONE-ACETAMINOPHEN 10-325 MG PO TABS
1.0000 | ORAL_TABLET | ORAL | 0 refills | Status: DC | PRN
  Filled 2023-04-13: qty 90, 15d supply, fill #0

## 2023-04-20 ENCOUNTER — Ambulatory Visit: Payer: Self-pay | Admitting: Family Medicine

## 2023-04-20 NOTE — Telephone Encounter (Signed)
 OPENED IN ERROR

## 2023-04-22 ENCOUNTER — Other Ambulatory Visit (HOSPITAL_COMMUNITY): Payer: Self-pay

## 2023-04-27 IMAGING — CR DG CHEST 2V
2 series · 2 of 2 positions shown · non-contrast
Comparison: Chest x-ray 01/14/2021.

CLINICAL DATA: 30-year-old male with history of hypoxia. History of
sickle cell disease.

EXAM:
CHEST - 2 VIEW

[w chest pa]
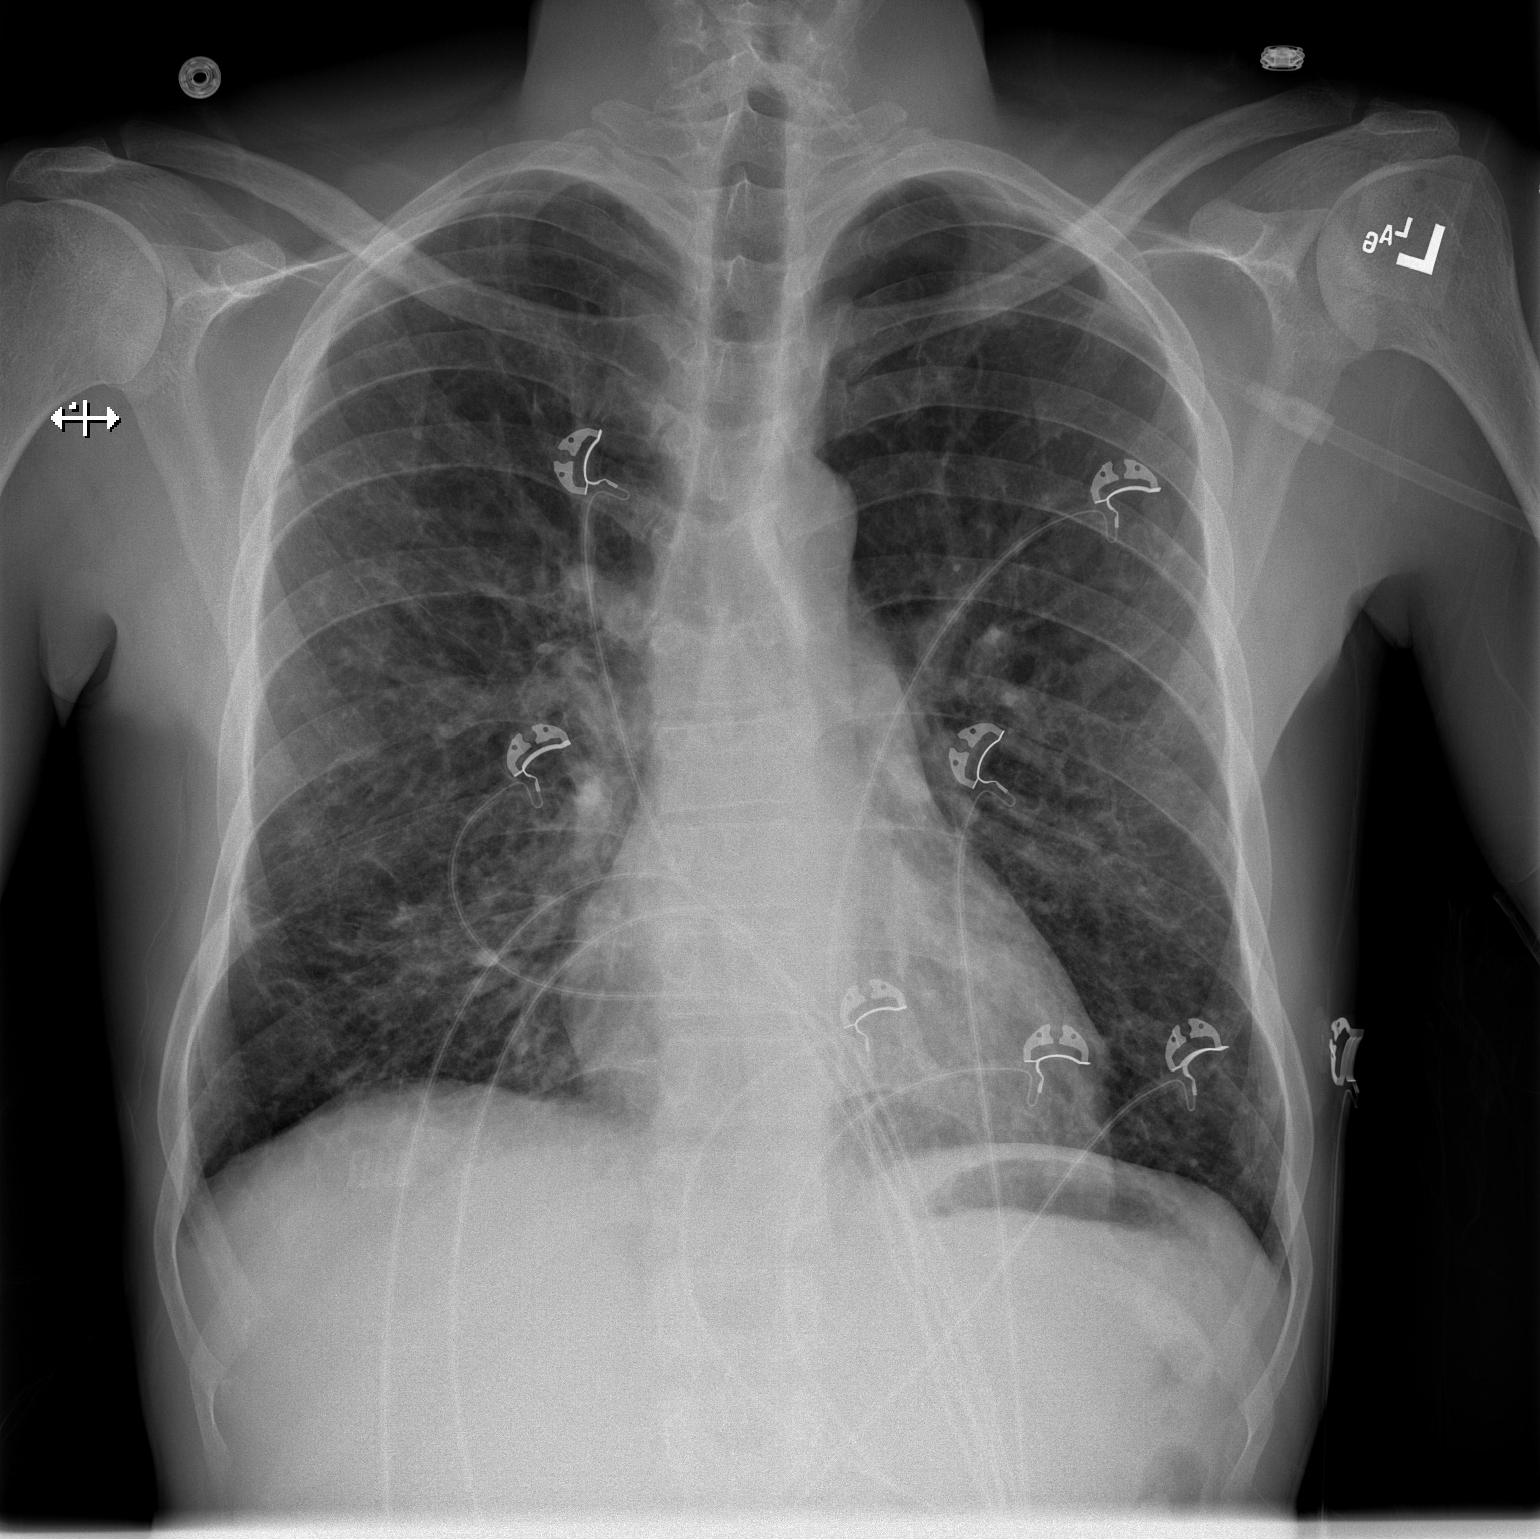

[w chest lat]
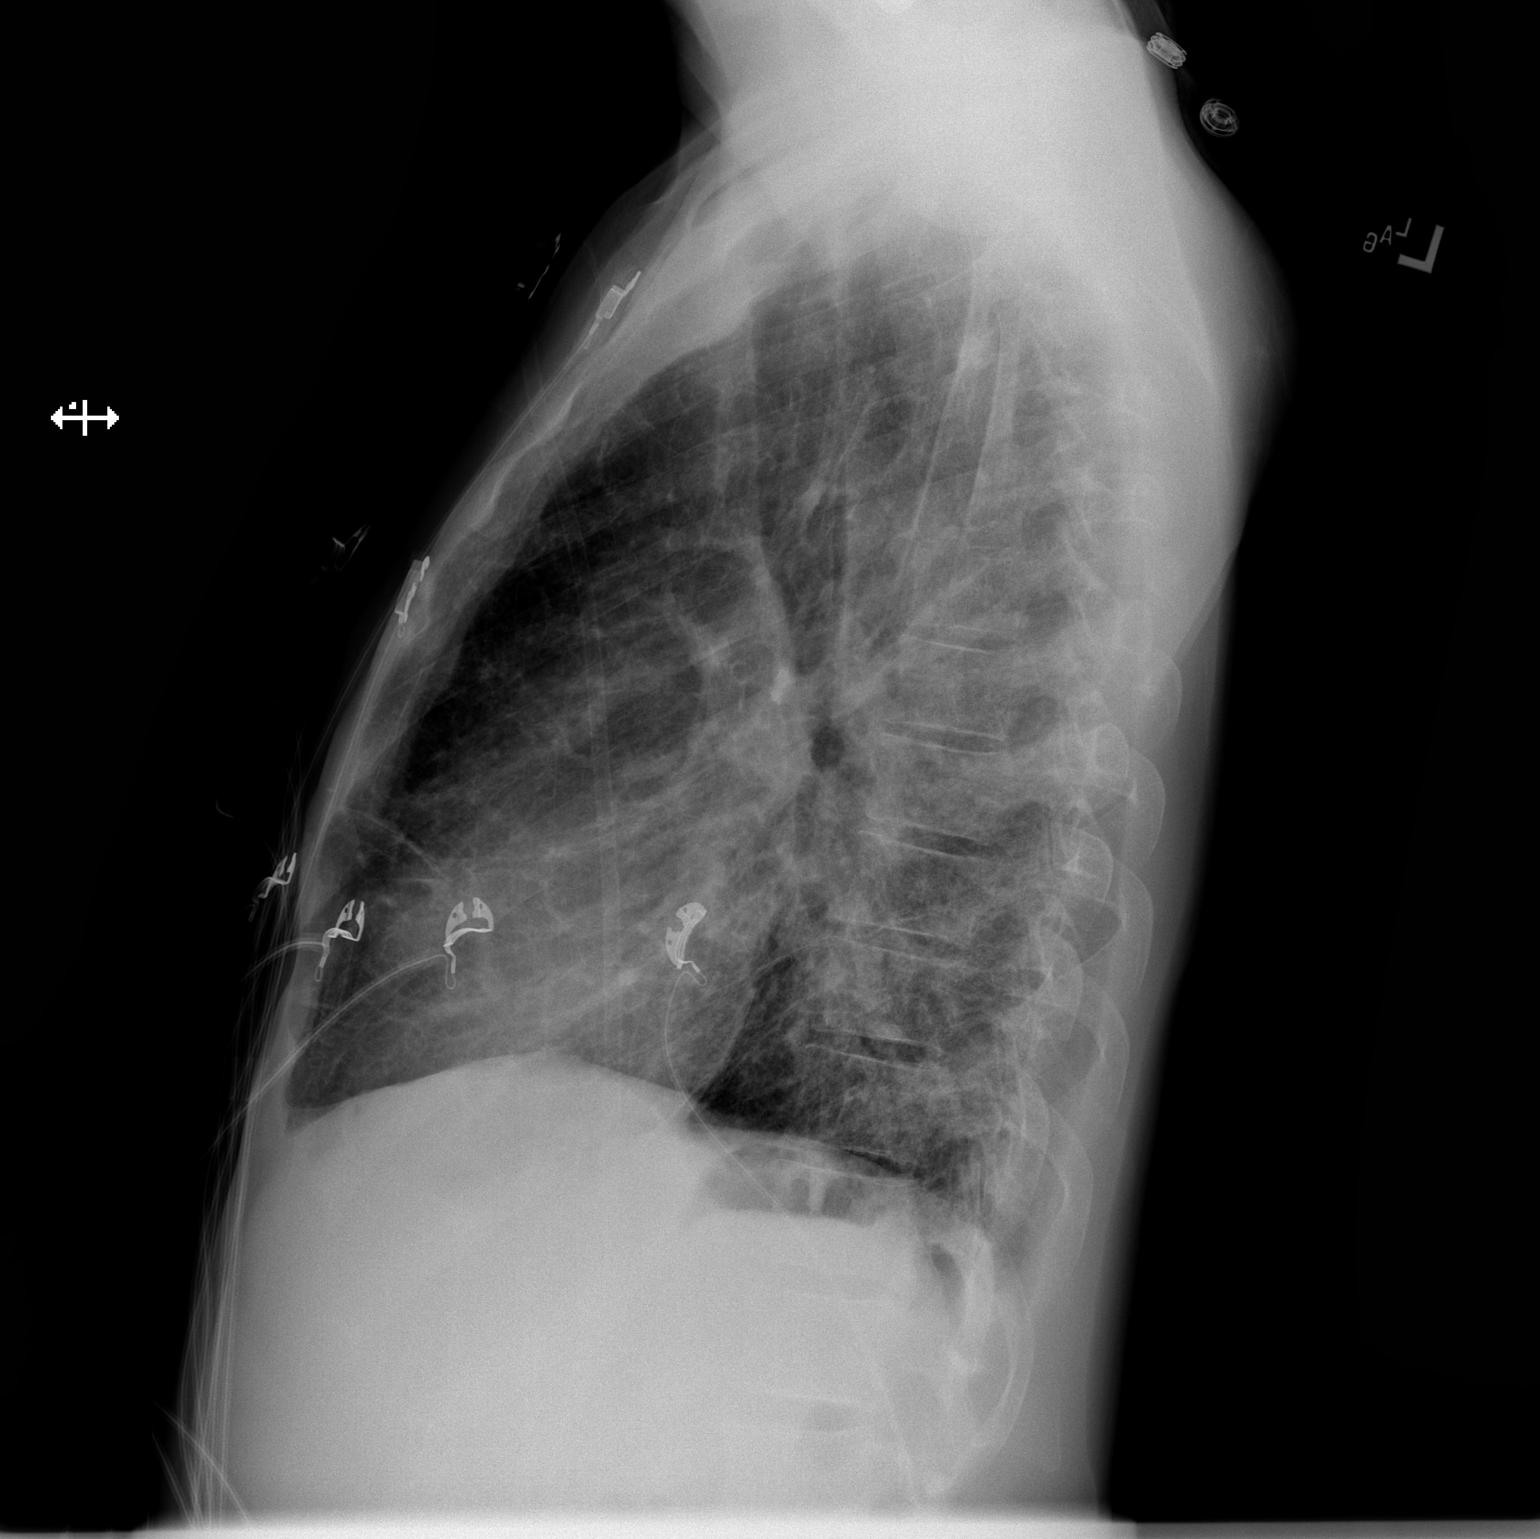

[2 of 2 positions shown; findings below may reference images not displayed]

FINDINGS: Lung volumes are normal. Widespread areas of interstitial prominence
and peribronchial cuffing, similar to several prior examinations. No
consolidative airspace disease. No pleural effusions. No
pneumothorax. No pulmonary nodule or mass noted. Pulmonary
vasculature and the cardiomediastinal silhouette are within normal
limits.
IMPRESSION: 1. No radiographic evidence of acute cardiopulmonary disease.
2. The appearance of the lungs is very similar to several prior
examinations, suggestive of multifocal scarring in this patient with
history of sickle cell disease.

## 2023-04-30 ENCOUNTER — Ambulatory Visit: Payer: Self-pay | Admitting: Nurse Practitioner

## 2023-05-06 ENCOUNTER — Other Ambulatory Visit: Payer: Self-pay | Admitting: Family Medicine

## 2023-05-06 DIAGNOSIS — D571 Sickle-cell disease without crisis: Secondary | ICD-10-CM

## 2023-05-06 DIAGNOSIS — F1190 Opioid use, unspecified, uncomplicated: Secondary | ICD-10-CM

## 2023-05-06 NOTE — Telephone Encounter (Signed)
Copied from CRM [id number]. Topic: Clinical - Medication Refill >> May 06, 2023  2:52 PM Shelah Lewandowsky wrote: Most Recent Primary Care Visit:  Provider: Massie Maroon  Department: [facility]  Visit Type: OFFICE VISIT  Date: 01/12/2023  Medication: ***  Has the patient contacted their pharmacy?  (Agent: If no, request that the patient contact the pharmacy for the refill. If patient does not wish to contact the pharmacy document the reason why and proceed with request.) (Agent: If yes, when and what did the pharmacy advise?)  Is this the correct pharmacy for this prescription?  If no, delete pharmacy and type the correct one.  This is the patient's preferred pharmacy:  [facility] - [facility] [address] [place] [address] Phone: [phone] Fax: [phone]  [facility] #[id number] - [place], [place] - [address] AT [facility] [address] [place] [address] Phone: [phone] Fax: [phone]  [facility] [phone] - [place], [place] - [address] [place] [address] Phone: [phone] Fax: [phone]   Has the prescription been filled recently?   Is the patient out of the medication?   Has the patient been seen for an appointment in the last year OR does the patient have an upcoming appointment?   Can we respond through MyChart?   Agent: Please be advised that Rx refills may take up to 3 business days. We ask that you follow-up with your pharmacy.

## 2023-05-07 ENCOUNTER — Other Ambulatory Visit (HOSPITAL_COMMUNITY): Payer: Self-pay

## 2023-05-07 MED ORDER — OXYCODONE-ACETAMINOPHEN 10-325 MG PO TABS
1.0000 | ORAL_TABLET | ORAL | 0 refills | Status: DC | PRN
  Filled 2023-05-07: qty 90, 15d supply, fill #0

## 2023-05-07 NOTE — Telephone Encounter (Signed)
Please advise. Pt no showed his last ov .   Renelda Loma RMA

## 2023-05-20 ENCOUNTER — Telehealth: Payer: Self-pay | Admitting: Family Medicine

## 2023-05-20 NOTE — Telephone Encounter (Signed)
Copied from CRM [id number]. Topic: Clinical - Medication Refill >> May 20, 2023 12:21 PM Fonda Kinder J wrote: Most Recent Primary Care Visit:  Provider: Massie Maroon  Department: [facility]  Visit Type: OFFICE VISIT  Date: 01/12/2023  Medication: oxyCODONE-acetaminophen (PERCOCET) 10-325 MG  Has the patient contacted their pharmacy? Yes (Agent: If no, request that the patient contact the pharmacy for the refill. If patient does not wish to contact the pharmacy document the reason why and proceed with request.) (Agent: If yes, when and what did the pharmacy advise?) Contact PCP  Is this the correct pharmacy for this prescription? Yes If no, delete pharmacy and type the correct one.  This is the patient's preferred pharmacy:  [facility] #[id number] - [place], [place] - [address] AT [facility] [address] [place] [address] Phone: [phone] Fax: [phone]   Has the prescription been filled recently? No  Is the patient out of the medication? Yes  Has the patient been seen for an appointment in the last year OR does the patient have an upcoming appointment? Yes  Can we respond through MyChart? No  Agent: Please be advised that Rx refills may take up to 3 business days. We ask that you follow-up with your pharmacy.

## 2023-05-22 ENCOUNTER — Other Ambulatory Visit: Payer: Self-pay | Admitting: Internal Medicine

## 2023-05-22 DIAGNOSIS — F1190 Opioid use, unspecified, uncomplicated: Secondary | ICD-10-CM

## 2023-05-22 DIAGNOSIS — D571 Sickle-cell disease without crisis: Secondary | ICD-10-CM

## 2023-05-24 NOTE — Telephone Encounter (Signed)
 Please advise Kh

## 2023-05-24 NOTE — Telephone Encounter (Signed)
   Notes to clinic: Request routed into [facility] clinical pool- not an office we fill prescriptions for- routed to PCP office for review    Requested Prescriptions  Pending Prescriptions Disp Refills   oxyCODONE-acetaminophen (PERCOCET) 10-325 MG tablet 90 tablet 0    Sig: Take 1 tablet by mouth every 4 hours as needed for pain.     There is no refill protocol information for this order       Requested Prescriptions  Pending Prescriptions Disp Refills   oxyCODONE-acetaminophen (PERCOCET) 10-325 MG tablet 90 tablet 0    Sig: Take 1 tablet by mouth every 4 hours as needed for pain.     There is no refill protocol information for this order

## 2023-05-27 ENCOUNTER — Other Ambulatory Visit: Payer: Self-pay

## 2023-05-27 ENCOUNTER — Other Ambulatory Visit: Payer: Self-pay | Admitting: Internal Medicine

## 2023-05-27 DIAGNOSIS — F1190 Opioid use, unspecified, uncomplicated: Secondary | ICD-10-CM

## 2023-05-27 DIAGNOSIS — G8929 Other chronic pain: Secondary | ICD-10-CM

## 2023-05-27 DIAGNOSIS — D571 Sickle-cell disease without crisis: Secondary | ICD-10-CM

## 2023-05-27 MED ORDER — HYDROXYUREA 500 MG PO CAPS: 2000.0000 mg | ORAL_CAPSULE | Freq: Every day | ORAL | 11 refills | Status: DC

## 2023-05-27 MED ORDER — OXYCODONE-ACETAMINOPHEN 10-325 MG PO TABS: 1.0000 | ORAL_TABLET | ORAL | 0 refills | Status: DC | PRN

## 2023-05-27 NOTE — Telephone Encounter (Signed)
 Copied from CRM [id number]. Topic: Clinical - Medication Refill >> May 27, 2023 11:15 AM Elle L wrote: Most Recent Primary Care Visit:  Provider: TILFORD BERTRAM HERO  Department: [facility]  Visit Type: OFFICE VISIT  Date: 01/12/2023  Medication: hydroxyurea  (HYDREA ) 500 MG capsule AND oxyCODONE -acetaminophen  (PERCOCET) 10-325 MG tablet   Has the patient contacted their pharmacy? Yes, the pharmacy advised him to reach out to his provider.   Is this the correct pharmacy for this prescription? Yes  This is the patient's preferred pharmacy:  [facility] #[id number] - [place], [place] - [address] AT [facility] [address] [place] [address] Phone: [phone] Fax: [phone]   Has the prescription been filled recently? No  Is the patient out of the medication? Yes  Has the patient been seen for an appointment in the last year OR does the patient have an upcoming appointment? Yes  Can we respond through MyChart? Yes  Agent: Please be advised that Rx refills may take up to 3 business days. We ask that you follow-up with your pharmacy.

## 2023-06-10 ENCOUNTER — Other Ambulatory Visit: Payer: Self-pay | Admitting: Family Medicine

## 2023-06-10 DIAGNOSIS — F1190 Opioid use, unspecified, uncomplicated: Secondary | ICD-10-CM

## 2023-06-10 DIAGNOSIS — D571 Sickle-cell disease without crisis: Secondary | ICD-10-CM

## 2023-06-10 NOTE — Telephone Encounter (Signed)
Copied from CRM [id number]. Topic: Clinical - Medication Refill >> Jun 10, 2023  1:43 PM Thomes Dinning wrote: Most Recent Primary Care Visit:  Provider: Massie Maroon  Department: [facility]  Visit Type: OFFICE VISIT  Date: 01/12/2023  Medication: oxyCODONE-acetaminophen (PERCOCET) 10-325 MG tablet  Has the patient contacted their pharmacy? Yes (Agent: If no, request that the patient contact the pharmacy for the refill. If patient does not wish to contact the pharmacy document the reason why and proceed with request.) (Agent: If yes, when and what did the pharmacy advise?)  Is this the correct pharmacy for this prescription? Yes If no, delete pharmacy and type the correct one.  This is the patient's preferred pharmacy:  [facility] [address] [place] [address] Phone: [phone] Fax: [phone]  [id number] - [place], [place] - [address] AT [facility] [address] [place] [address] Phone: [phone] Fax: [phone]   Has the prescription been filled recently? Yes  Is the patient out of the medication? Yes  Has the patient been seen for an appointment in the last year OR does the patient have an upcoming appointment? Yes  Can we respond through MyChart? Yes  Agent: Please be advised that Rx refills may take up to 3 business days. We ask that you follow-up with your pharmacy.

## 2023-06-11 ENCOUNTER — Other Ambulatory Visit (HOSPITAL_COMMUNITY): Payer: Self-pay

## 2023-06-11 ENCOUNTER — Other Ambulatory Visit: Payer: Self-pay | Admitting: Nurse Practitioner

## 2023-06-11 DIAGNOSIS — F1190 Opioid use, unspecified, uncomplicated: Secondary | ICD-10-CM

## 2023-06-11 DIAGNOSIS — D571 Sickle-cell disease without crisis: Secondary | ICD-10-CM

## 2023-06-11 MED ORDER — OXYCODONE-ACETAMINOPHEN 10-325 MG PO TABS
1.0000 | ORAL_TABLET | ORAL | 0 refills | Status: DC | PRN
  Filled 2023-06-14: qty 90, 15d supply, fill #0

## 2023-06-11 NOTE — Telephone Encounter (Signed)
 Wrong office

## 2023-06-11 NOTE — Telephone Encounter (Signed)
 Please advise [facility]

## 2023-06-11 NOTE — Progress Notes (Signed)
Reviewed PDMP substance reporting system prior to prescribing opiate medications. No inconsistencies noted.    1. Hb-SS disease without crisis (HCC)  - oxyCODONE-acetaminophen (PERCOCET) 10-325 MG tablet; Take 1 tablet by mouth every 4 (four) hours as needed for up to 15 days for pain.  Dispense: 90 tablet; Refill: 0  2. Chronic, continuous use of opioids  - oxyCODONE-acetaminophen (PERCOCET) 10-325 MG tablet; Take 1 tablet by mouth every 4 (four) hours as needed for up to 15 days for pain.  Dispense: 90 tablet; Refill: 0

## 2023-06-14 ENCOUNTER — Other Ambulatory Visit (HOSPITAL_COMMUNITY): Payer: Self-pay

## 2023-06-19 ENCOUNTER — Encounter (HOSPITAL_COMMUNITY): Payer: Self-pay

## 2023-06-19 ENCOUNTER — Other Ambulatory Visit: Payer: Self-pay

## 2023-06-19 ENCOUNTER — Inpatient Hospital Stay (HOSPITAL_COMMUNITY)
Admission: EM | Admit: 2023-06-19 | Discharge: 2023-06-22 | DRG: 871 | Disposition: A | Discharge status: Home / Self Care | Payer: Self-pay | Attending: Internal Medicine | Admitting: Internal Medicine

## 2023-06-19 ENCOUNTER — Emergency Department (HOSPITAL_COMMUNITY): Payer: Self-pay

## 2023-06-19 DIAGNOSIS — J101 Influenza due to other identified influenza virus with other respiratory manifestations: Principal | ICD-10-CM | POA: Diagnosis present

## 2023-06-19 DIAGNOSIS — Z21 Asymptomatic human immunodeficiency virus [HIV] infection status: Secondary | ICD-10-CM | POA: Insufficient documentation

## 2023-06-19 DIAGNOSIS — K219 Gastro-esophageal reflux disease without esophagitis: Secondary | ICD-10-CM | POA: Diagnosis present

## 2023-06-19 DIAGNOSIS — J9621 Acute and chronic respiratory failure with hypoxia: Secondary | ICD-10-CM | POA: Diagnosis present

## 2023-06-19 DIAGNOSIS — J111 Influenza due to unidentified influenza virus with other respiratory manifestations: Secondary | ICD-10-CM | POA: Diagnosis present

## 2023-06-19 DIAGNOSIS — Z9981 Dependence on supplemental oxygen: Secondary | ICD-10-CM

## 2023-06-19 DIAGNOSIS — E871 Hypo-osmolality and hyponatremia: Secondary | ICD-10-CM | POA: Diagnosis present

## 2023-06-19 DIAGNOSIS — F419 Anxiety disorder, unspecified: Secondary | ICD-10-CM | POA: Diagnosis present

## 2023-06-19 DIAGNOSIS — A4189 Other specified sepsis: Principal | ICD-10-CM | POA: Diagnosis present

## 2023-06-19 DIAGNOSIS — G894 Chronic pain syndrome: Secondary | ICD-10-CM | POA: Diagnosis present

## 2023-06-19 DIAGNOSIS — Z86711 Personal history of pulmonary embolism: Secondary | ICD-10-CM | POA: Diagnosis present

## 2023-06-19 DIAGNOSIS — Z79899 Other long term (current) drug therapy: Secondary | ICD-10-CM

## 2023-06-19 DIAGNOSIS — J1000 Influenza due to other identified influenza virus with unspecified type of pneumonia: Secondary | ICD-10-CM | POA: Diagnosis present

## 2023-06-19 DIAGNOSIS — Z832 Family history of diseases of the blood and blood-forming organs and certain disorders involving the immune mechanism: Secondary | ICD-10-CM

## 2023-06-19 DIAGNOSIS — Z7901 Long term (current) use of anticoagulants: Secondary | ICD-10-CM

## 2023-06-19 DIAGNOSIS — N179 Acute kidney failure, unspecified: Secondary | ICD-10-CM

## 2023-06-19 DIAGNOSIS — E872 Acidosis, unspecified: Secondary | ICD-10-CM | POA: Diagnosis present

## 2023-06-19 DIAGNOSIS — J09X1 Influenza due to identified novel influenza A virus with pneumonia: Secondary | ICD-10-CM

## 2023-06-19 DIAGNOSIS — J9601 Acute respiratory failure with hypoxia: Secondary | ICD-10-CM

## 2023-06-19 DIAGNOSIS — F1120 Opioid dependence, uncomplicated: Secondary | ICD-10-CM | POA: Diagnosis present

## 2023-06-19 DIAGNOSIS — F1220 Cannabis dependence, uncomplicated: Secondary | ICD-10-CM | POA: Diagnosis present

## 2023-06-19 DIAGNOSIS — D638 Anemia in other chronic diseases classified elsewhere: Secondary | ICD-10-CM | POA: Diagnosis present

## 2023-06-19 DIAGNOSIS — D5700 Hb-SS disease with crisis, unspecified: Secondary | ICD-10-CM | POA: Diagnosis present

## 2023-06-19 DIAGNOSIS — E860 Dehydration: Secondary | ICD-10-CM | POA: Diagnosis present

## 2023-06-19 MED ORDER — SODIUM CHLORIDE 0.9 % IV BOLUS (SEPSIS)
1000.0000 mL | Freq: Once | INTRAVENOUS | Status: AC
  Administered 2023-06-20: 1000 mL via INTRAVENOUS

## 2023-06-19 MED ORDER — HYDROMORPHONE HCL 2 MG/ML IJ SOLN
2.0000 mg | INTRAMUSCULAR | Status: AC
  Administered 2023-06-20: 2 mg via INTRAVENOUS
  Filled 2023-06-19: qty 1

## 2023-06-19 MED ORDER — DIPHENHYDRAMINE HCL 25 MG PO CAPS
25.0000 mg | ORAL_CAPSULE | ORAL | Status: DC | PRN
  Administered 2023-06-20: 50 mg via ORAL
  Filled 2023-06-19: qty 2

## 2023-06-19 MED ORDER — ONDANSETRON HCL 4 MG/2ML IJ SOLN
4.0000 mg | INTRAMUSCULAR | Status: DC | PRN
  Administered 2023-06-20: 4 mg via INTRAVENOUS
  Filled 2023-06-19: qty 2

## 2023-06-19 MED ORDER — ACETAMINOPHEN 325 MG PO TABS
650.0000 mg | ORAL_TABLET | Freq: Once | ORAL | Status: AC
  Administered 2023-06-20: 650 mg via ORAL
  Filled 2023-06-19: qty 2

## 2023-06-19 NOTE — ED Triage Notes (Signed)
 Pt reports sickle cell pain crisis x1 day. Pt has tried home meds without relief. Pt reports pain "all over" .

## 2023-06-19 NOTE — ED Notes (Signed)
 Pt requesting that we use his port for labs.

## 2023-06-20 DIAGNOSIS — N179 Acute kidney failure, unspecified: Secondary | ICD-10-CM

## 2023-06-20 DIAGNOSIS — J9601 Acute respiratory failure with hypoxia: Secondary | ICD-10-CM

## 2023-06-20 DIAGNOSIS — J09X1 Influenza due to identified novel influenza A virus with pneumonia: Secondary | ICD-10-CM

## 2023-06-20 DIAGNOSIS — J111 Influenza due to unidentified influenza virus with other respiratory manifestations: Secondary | ICD-10-CM | POA: Diagnosis present

## 2023-06-20 DIAGNOSIS — J101 Influenza due to other identified influenza virus with other respiratory manifestations: Secondary | ICD-10-CM | POA: Diagnosis present

## 2023-06-20 DIAGNOSIS — D5700 Hb-SS disease with crisis, unspecified: Secondary | ICD-10-CM

## 2023-06-20 LAB — RESP PANEL BY RT-PCR (RSV, FLU A&B, COVID)  RVPGX2
Influenza A by PCR: POSITIVE — AB
Influenza B by PCR: NEGATIVE
Resp Syncytial Virus by PCR: NEGATIVE
SARS Coronavirus 2 by RT PCR: NEGATIVE

## 2023-06-20 LAB — URINALYSIS, ROUTINE W REFLEX MICROSCOPIC
Bacteria, UA: NONE SEEN
Bilirubin Urine: NEGATIVE
Glucose, UA: NEGATIVE mg/dL
Ketones, ur: NEGATIVE mg/dL
Leukocytes,Ua: NEGATIVE
Nitrite: NEGATIVE
Protein, ur: 100 mg/dL — AB
Specific Gravity, Urine: 1.014 (ref 1.005–1.030)
pH: 5 (ref 5.0–8.0)

## 2023-06-20 LAB — HEPATITIS PANEL, ACUTE
HCV Ab: NONREACTIVE
Hep A IgM: NONREACTIVE
Hep B C IgM: NONREACTIVE
Hepatitis B Surface Ag: NONREACTIVE

## 2023-06-20 LAB — COMPREHENSIVE METABOLIC PANEL
ALT: 45 U/L — ABNORMAL HIGH (ref 0–44)
ALT: 37 U/L (ref 0–44)
AST: 149 U/L — ABNORMAL HIGH (ref 15–41)
AST: 101 U/L — ABNORMAL HIGH (ref 15–41)
Albumin: 4.3 g/dL (ref 3.5–5.0)
Albumin: 3.4 g/dL — ABNORMAL LOW (ref 3.5–5.0)
Alkaline Phosphatase: 53 U/L (ref 38–126)
Alkaline Phosphatase: 42 U/L (ref 38–126)
Anion gap: 11 (ref 5–15)
Anion gap: 8 (ref 5–15)
BUN: 51 mg/dL — ABNORMAL HIGH (ref 6–20)
BUN: 35 mg/dL — ABNORMAL HIGH (ref 6–20)
CO2: 20 mmol/L — ABNORMAL LOW (ref 22–32)
CO2: 21 mmol/L — ABNORMAL LOW (ref 22–32)
Calcium: 8.2 mg/dL — ABNORMAL LOW (ref 8.9–10.3)
Calcium: 7.9 mg/dL — ABNORMAL LOW (ref 8.9–10.3)
Chloride: 101 mmol/L (ref 98–111)
Chloride: 105 mmol/L (ref 98–111)
Creatinine, Ser: 2.45 mg/dL — ABNORMAL HIGH (ref 0.61–1.24)
Creatinine, Ser: 1.58 mg/dL — ABNORMAL HIGH (ref 0.61–1.24)
GFR, Estimated: 35 mL/min — ABNORMAL LOW (ref >60)
GFR, Estimated: 59 mL/min — ABNORMAL LOW (ref >60)
Glucose, Bld: 108 mg/dL — ABNORMAL HIGH (ref 70–99)
Glucose, Bld: 92 mg/dL (ref 70–99)
Potassium: 4.2 mmol/L (ref 3.5–5.1)
Potassium: 4.8 mmol/L (ref 3.5–5.1)
Sodium: 132 mmol/L — ABNORMAL LOW (ref 135–145)
Sodium: 134 mmol/L — ABNORMAL LOW (ref 135–145)
Total Bilirubin: 4.4 mg/dL — ABNORMAL HIGH (ref 0.0–1.2)
Total Bilirubin: 2.2 mg/dL — ABNORMAL HIGH (ref 0.0–1.2)
Total Protein: 8.6 g/dL — ABNORMAL HIGH (ref 6.5–8.1)
Total Protein: 7.3 g/dL (ref 6.5–8.1)

## 2023-06-20 LAB — CBC
HCT: 21.1 % — ABNORMAL LOW (ref 39.0–52.0)
Hemoglobin: 7.6 g/dL — ABNORMAL LOW (ref 13.0–17.0)
MCH: 32.9 pg (ref 26.0–34.0)
MCHC: 36 g/dL (ref 30.0–36.0)
MCV: 91.3 fL (ref 80.0–100.0)
Platelets: 145 10*3/uL — ABNORMAL LOW (ref 150–400)
RBC: 2.31 MIL/uL — ABNORMAL LOW (ref 4.22–5.81)
RDW: 24.6 % — ABNORMAL HIGH (ref 11.5–15.5)
WBC: 13.1 10*3/uL — ABNORMAL HIGH (ref 4.0–10.5)
nRBC: 7.9 % — ABNORMAL HIGH (ref 0.0–0.2)

## 2023-06-20 LAB — CBC WITH DIFFERENTIAL/PLATELET
Abs Immature Granulocytes: 0.08 10*3/uL — ABNORMAL HIGH (ref 0.00–0.07)
Basophils Absolute: 0.1 10*3/uL (ref 0.0–0.1)
Basophils Relative: 0 %
Eosinophils Absolute: 0 10*3/uL (ref 0.0–0.5)
Eosinophils Relative: 0 %
HCT: 25.4 % — ABNORMAL LOW (ref 39.0–52.0)
Hemoglobin: 9.2 g/dL — ABNORMAL LOW (ref 13.0–17.0)
Immature Granulocytes: 1 %
Lymphocytes Relative: 27 %
Lymphs Abs: 3.7 10*3/uL (ref 0.7–4.0)
MCH: 32.7 pg (ref 26.0–34.0)
MCHC: 36.2 g/dL — ABNORMAL HIGH (ref 30.0–36.0)
MCV: 90.4 fL (ref 80.0–100.0)
Monocytes Absolute: 1 10*3/uL (ref 0.1–1.0)
Monocytes Relative: 7 %
Neutro Abs: 8.8 10*3/uL — ABNORMAL HIGH (ref 1.7–7.7)
Neutrophils Relative %: 65 %
Platelets: 178 10*3/uL (ref 150–400)
RBC: 2.81 MIL/uL — ABNORMAL LOW (ref 4.22–5.81)
RDW: 24.3 % — ABNORMAL HIGH (ref 11.5–15.5)
WBC: 13.6 10*3/uL — ABNORMAL HIGH (ref 4.0–10.5)
nRBC: 8.7 % — ABNORMAL HIGH (ref 0.0–0.2)

## 2023-06-20 LAB — TROPONIN I (HIGH SENSITIVITY)
Troponin I (High Sensitivity): 10 ng/L (ref <18)
Troponin I (High Sensitivity): 7 ng/L (ref <18)

## 2023-06-20 LAB — LACTIC ACID, PLASMA: Lactic Acid, Venous: 0.5 mmol/L (ref 0.5–1.9)

## 2023-06-20 LAB — TYPE AND SCREEN
ABO/RH(D): O POS
Antibody Screen: NEGATIVE

## 2023-06-20 LAB — PROCALCITONIN: Procalcitonin: 0.34 ng/mL

## 2023-06-20 LAB — RETICULOCYTES
Immature Retic Fract: 8.9 % (ref 2.3–15.9)
RBC.: 1.92 MIL/uL — ABNORMAL LOW (ref 4.22–5.81)
Retic Count, Absolute: 220 10*3/uL — ABNORMAL HIGH (ref 19.0–186.0)
Retic Ct Pct: 11.5 % — ABNORMAL HIGH (ref 0.4–3.1)

## 2023-06-20 LAB — I-STAT CG4 LACTIC ACID, ED: Lactic Acid, Venous: 0.6 mmol/L (ref 0.5–1.9)

## 2023-06-20 MED ORDER — SENNOSIDES-DOCUSATE SODIUM 8.6-50 MG PO TABS
1.0000 | ORAL_TABLET | Freq: Two times a day (BID) | ORAL | Status: DC
  Administered 2023-06-20 – 2023-06-21 (×2): 1 via ORAL
  Filled 2023-06-20 (×5): qty 1

## 2023-06-20 MED ORDER — POLYETHYLENE GLYCOL 3350 17 G PO PACK: 17.0000 g | PACK | Freq: Every day | ORAL | Status: DC | PRN

## 2023-06-20 MED ORDER — HYDROMORPHONE 1 MG/ML IV SOLN
INTRAVENOUS | Status: DC
  Administered 2023-06-20: 30 mg via INTRAVENOUS
  Administered 2023-06-20: 3 mg via INTRAVENOUS
  Administered 2023-06-21: 4.5 mg via INTRAVENOUS
  Administered 2023-06-21: 30 mg via INTRAVENOUS
  Administered 2023-06-21: 3.5 mg via INTRAVENOUS
  Administered 2023-06-21: 2 mg via INTRAVENOUS
  Administered 2023-06-21: 3 mg via INTRAVENOUS
  Administered 2023-06-21: 7.5 mg via INTRAVENOUS
  Administered 2023-06-21: 0.5 mg via INTRAVENOUS
  Administered 2023-06-21 – 2023-06-22 (×2): 1 mg via INTRAVENOUS
  Administered 2023-06-22: 3.5 mg via INTRAVENOUS
  Administered 2023-06-22 (×4): 1 mg via INTRAVENOUS
  Filled 2023-06-20 (×2): qty 30

## 2023-06-20 MED ORDER — OSELTAMIVIR PHOSPHATE 30 MG PO CAPS
30.0000 mg | ORAL_CAPSULE | Freq: Two times a day (BID) | ORAL | Status: DC
  Administered 2023-06-20 – 2023-06-22 (×5): 30 mg via ORAL
  Filled 2023-06-20 (×5): qty 1

## 2023-06-20 MED ORDER — SODIUM CHLORIDE 0.9% FLUSH: 9.0000 mL | INTRAVENOUS | Status: DC | PRN

## 2023-06-20 MED ORDER — SODIUM CHLORIDE 0.9 % IV BOLUS
1000.0000 mL | Freq: Once | INTRAVENOUS | Status: AC
  Administered 2023-06-20: 1000 mL via INTRAVENOUS

## 2023-06-20 MED ORDER — ALBUTEROL SULFATE (2.5 MG/3ML) 0.083% IN NEBU: 2.5000 mg | INHALATION_SOLUTION | Freq: Four times a day (QID) | RESPIRATORY_TRACT | Status: DC | PRN

## 2023-06-20 MED ORDER — NALOXONE HCL 0.4 MG/ML IJ SOLN: 0.4000 mg | INTRAMUSCULAR | Status: DC | PRN

## 2023-06-20 MED ORDER — ACETAMINOPHEN 325 MG PO TABS
650.0000 mg | ORAL_TABLET | Freq: Four times a day (QID) | ORAL | Status: DC | PRN
Start: 2023-06-20 — End: 2023-06-23
  Administered 2023-06-20: 650 mg via ORAL
  Filled 2023-06-20: qty 2

## 2023-06-20 MED ORDER — GUAIFENESIN-DM 100-10 MG/5ML PO SYRP: 10.0000 mL | ORAL_SOLUTION | ORAL | Status: DC | PRN

## 2023-06-20 MED ORDER — ONDANSETRON HCL 4 MG/2ML IJ SOLN: 4.0000 mg | Freq: Four times a day (QID) | INTRAMUSCULAR | Status: DC | PRN

## 2023-06-20 MED ORDER — HYDROMORPHONE 1 MG/ML IV SOLN
INTRAVENOUS | Status: DC
Start: 2023-06-20 — End: 2023-06-20

## 2023-06-20 MED ORDER — LACTATED RINGERS IV SOLN: INTRAVENOUS | Status: DC

## 2023-06-20 MED ORDER — DIPHENHYDRAMINE HCL 12.5 MG/5ML PO ELIX: 12.5000 mg | ORAL_SOLUTION | Freq: Four times a day (QID) | ORAL | Status: DC | PRN

## 2023-06-20 MED ORDER — DIPHENHYDRAMINE HCL 25 MG PO CAPS
25.0000 mg | ORAL_CAPSULE | Freq: Three times a day (TID) | ORAL | Status: DC | PRN
Start: 2023-06-20 — End: 2023-06-20

## 2023-06-20 MED ORDER — SODIUM CHLORIDE 0.45 % IV SOLN
INTRAVENOUS | Status: DC
Start: 2023-06-20 — End: 2023-06-21

## 2023-06-20 MED ORDER — SODIUM CHLORIDE 0.9 % IV SOLN
1.0000 g | Freq: Every day | INTRAVENOUS | Status: DC
  Administered 2023-06-20 – 2023-06-22 (×3): 1 g via INTRAVENOUS
  Filled 2023-06-20 (×3): qty 10

## 2023-06-20 MED ORDER — ONDANSETRON HCL 4 MG/2ML IJ SOLN
4.0000 mg | Freq: Four times a day (QID) | INTRAMUSCULAR | Status: DC | PRN
  Administered 2023-06-21 (×2): 4 mg via INTRAVENOUS
  Filled 2023-06-20 (×2): qty 2

## 2023-06-20 MED ORDER — GUAIFENESIN-DM 100-10 MG/5ML PO SYRP: 5.0000 mL | ORAL_SOLUTION | ORAL | Status: DC | PRN

## 2023-06-20 MED ORDER — OSELTAMIVIR PHOSPHATE 75 MG PO CAPS
75.0000 mg | ORAL_CAPSULE | Freq: Once | ORAL | Status: AC
  Administered 2023-06-20: 75 mg via ORAL
  Filled 2023-06-20: qty 1

## 2023-06-20 MED ORDER — DIPHENHYDRAMINE HCL 25 MG PO CAPS: 25.0000 mg | ORAL_CAPSULE | ORAL | Status: DC | PRN

## 2023-06-20 MED ORDER — SODIUM CHLORIDE 0.9 % IV BOLUS (SEPSIS)
1000.0000 mL | Freq: Once | INTRAVENOUS | Status: AC
  Administered 2023-06-20: 1000 mL via INTRAVENOUS

## 2023-06-20 MED ORDER — SODIUM CHLORIDE 0.9 % IV SOLN
500.0000 mg | Freq: Every day | INTRAVENOUS | Status: DC
  Administered 2023-06-20 – 2023-06-22 (×3): 500 mg via INTRAVENOUS
  Filled 2023-06-20 (×3): qty 5

## 2023-06-20 MED ORDER — HYDROMORPHONE HCL 2 MG/ML IJ SOLN
2.0000 mg | INTRAMUSCULAR | Status: DC | PRN
  Administered 2023-06-20: 2 mg via INTRAVENOUS
  Filled 2023-06-20: qty 1

## 2023-06-20 MED ORDER — DIPHENHYDRAMINE HCL 50 MG/ML IJ SOLN: 12.5000 mg | Freq: Four times a day (QID) | INTRAMUSCULAR | Status: DC | PRN

## 2023-06-20 NOTE — H&P (Signed)
 History and Physical    Martin [name]:[id number] DOB: 13-Aug-1990 DOA: 06/19/2023  PCP: Massie Maroon, [name]  Patient coming from: Home  Chief Complaint: Pain all over  HPI: [name] is a 33 y.o. male with medical history significant of sickle cell disease, chronic pain syndrome, HIV, anxiety, marijuana abuse, GERD, PE on Eliquis, chronic hypoxemic respiratory failure on 3 L home oxygen presented to the ED with complaints of diffuse body pain, cough, and shortness of breath.  Vital signs on arrival: Temperature 101.2 F, pulse 115, respiratory rate 17, blood pressure 104/69, and SpO2 93% on 3 L [place].  Labs showing WBC count 13.6, hemoglobin 9.2 (stable), sodium 132, bicarb 20, anion gap 11, glucose 108, BUN 51, creatinine 2.4 (baseline 0.6-0.8), AST 149, ALT 45, alk phos 53, T. bili 4.4 (improved), lactic acid normal, UA not suggestive of infection, influenza A PCR positive.  Chest x-ray showing underlying chronic changes with possible more acute groundglass airspace disease in the left mid to lower lung. Patient was given Tylenol, Tamiflu, 3 L normal saline, Benadryl, Zofran, and multiple doses of IV Dilaudid.  [facility] called to admit.  Patient states he has been feeling ill for the past 2 days.  Having fevers, chills, cough, mild shortness of breath, substernal chest pain, and severe diffuse body pain except no pain in his abdomen and no nausea, vomiting, or diarrhea.  He has not been eating or drinking much.  He continues to complain of severe 10 out of 10 intensity diffuse body pain despite receiving doses of IV Dilaudid in the ED. He is chronically on 3 L home oxygen PRN.  Review of Systems:  Review of Systems  All other systems reviewed and are negative.   Past Medical History:  Diagnosis Date   Anxiety    HIV (human immunodeficiency virus infection) (HCC)    Proteinuria    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HCC)    Vitamin D deficiency 10/2018    Past Surgical History:  Procedure  Laterality Date   IR IMAGING GUIDED PORT INSERTION  08/29/2019   IR REMOVAL TUN ACCESS W/ PORT W/O FL MOD SED  08/30/2020   TEE WITHOUT CARDIOVERSION N/A 08/30/2020   Procedure: TRANSESOPHAGEAL ECHOCARDIOGRAM (TEE);  Surgeon: [name], MD;  Location: [facility] ENDOSCOPY;  Service: Cardiovascular;  Laterality: N/A;     reports that he has never smoked. He has never used smokeless tobacco. He reports current drug use. Drug: Marijuana. He reports that he does not drink alcohol.  Allergies  Allergen Reactions   Ketorolac Tromethamine Swelling and Other (See Comments)    Patient reports facial edema and left arm edema after administration.   Tape Rash and Other (See Comments)    PLEASE DO NOT USE THE CLEAR, THICK, "PLASTIC" TAPE- only paper tape is tolerated    Wound Dressing Adhesive Rash    Family History  Problem Relation Age of Onset   Sickle cell trait Mother    Sickle cell trait Father    Birth defects Maternal Grandmother    Birth defects Paternal Grandmother     Prior to Admission medications   Medication Sig Start Date End Date Taking? Authorizing Provider  abacavir (ZIAGEN) 300 MG tablet Take 600 mg by mouth at bedtime. 12/30/22   [provider]  apixaban (ELIQUIS) 5 MG TABS tablet Take 1 tablet (5 mg total) by mouth 2 (two) times daily. 12/02/21   Massie Maroon, [name]  cyclobenzaprine (FLEXERIL) 10 MG tablet Take 10 mg by  mouth 2 (two) times daily as needed. 12/22/22   [provider]  Deferiprone (FERRIPROX) 500 MG TABS Take 1 tablet by mouth daily. Patient not taking: Reported on 01/16/2023 10/19/22   Massie Maroon, [name]  ergocalciferol (VITAMIN D2) 1.25 MG (50000 UT) capsule Take 1 capsule (50,000 Units total) by mouth once a week. Patient taking differently: Take 50,000 Units by mouth once a week. Tues 10/14/22   Massie Maroon, [name]  folic acid (FOLVITE) 1 MG tablet Take 1 mg by mouth daily. 10/10/21   [provider]  gabapentin (NEURONTIN) 300  MG capsule Take 300 mg by mouth at bedtime as needed.    [provider]  hydroxyurea (HYDREA) 500 MG capsule Take 4 capsules (2,000 mg total) by mouth at bedtime. 05/27/23   Quentin Angst, MD  lamivudine (EPIVIR) 300 MG tablet Take 300 mg by mouth at bedtime. Patient not taking: Reported on 03/24/2023 12/30/22   [provider]  NARCAN 4 MG/0.1ML LIQD nasal spray kit Place 1 spray into the nose once. 01/07/23   [provider]  oxyCODONE-acetaminophen (PERCOCET) 10-325 MG tablet Take 1 tablet by mouth every 4 (four) hours as needed for up to 15 days for pain. 06/13/23 07/04/23  Donell Beers, [name]  pantoprazole (PROTONIX) 40 MG tablet Take 40 mg by mouth daily as needed. 12/25/22   [provider]  sulfamethoxazole-trimethoprim (BACTRIM DS) 800-160 MG tablet Take 1 tablet by mouth daily. 03/09/23   [provider]  TIVICAY 50 MG tablet Take 50 mg by mouth at bedtime. 12/30/22   [provider]  triamcinolone cream (KENALOG) 0.1 % Apply 1 Application topically 2 (two) times daily. 01/12/23   Massie Maroon, [name]    Physical Exam: Vitals:   06/20/23 0300 06/20/23 0330 06/20/23 0400 06/20/23 0500  BP: 100/61 108/70 (!) 87/64 93/63  Pulse: 87 86 89 80  Resp: 11 10 (!) 9 11  Temp:    97.9 F (36.6 C)  TempSrc:    Oral  SpO2:  92%  92%  Weight:      Height:        Physical Exam Vitals reviewed.  Constitutional:      Appearance: He is ill-appearing.  HENT:     Head: Normocephalic and atraumatic.  Eyes:     Extraocular Movements: Extraocular movements intact.  Cardiovascular:     Rate and Rhythm: Normal rate and regular rhythm.     Pulses: Normal pulses.  Pulmonary:     Effort: Pulmonary effort is normal. No respiratory distress.     Breath sounds: Rhonchi present.  Abdominal:     General: Bowel sounds are normal.     Palpations: Abdomen is soft.     Tenderness: There is no abdominal tenderness. There is no guarding or  rebound.  Musculoskeletal:     Cervical back: Normal range of motion.     Right lower leg: No edema.     Left lower leg: No edema.  Skin:    General: Skin is warm and dry.  Neurological:     General: No focal deficit present.     Mental Status: He is alert and oriented to person, place, and time.     Labs on Admission: I have personally reviewed following labs and imaging studies  CBC: Recent Labs  Lab 06/20/23 0115  WBC 13.6*  NEUTROABS 8.8*  HGB 9.2*  HCT 25.4*  MCV 90.4  PLT 178   Basic Metabolic Panel: Recent Labs  Lab 06/20/23 0115  NA 132*  K 4.2  CL 101  CO2 20*  GLUCOSE 108*  BUN 51*  CREATININE 2.45*  CALCIUM 8.2*   GFR: Estimated Creatinine Clearance: 38.5 mL/min (A) (by C-G formula based on SCr of 2.45 mg/dL (H)). Liver Function Tests: Recent Labs  Lab 06/20/23 0115  AST 149*  ALT 45*  ALKPHOS 53  BILITOT 4.4*  PROT 8.6*  ALBUMIN 4.3   No results for input(s): "LIPASE", "AMYLASE" in the last 168 hours. No results for input(s): "AMMONIA" in the last 168 hours. Coagulation Profile: No results for input(s): "INR", "PROTIME" in the last 168 hours. Cardiac Enzymes: No results for input(s): "CKTOTAL", "CKMB", "CKMBINDEX", "TROPONINI" in the last 168 hours. BNP (last 3 results) No results for input(s): "PROBNP" in the last 8760 hours. HbA1C: No results for input(s): "HGBA1C" in the last 72 hours. CBG: No results for input(s): "GLUCAP" in the last 168 hours. Lipid Profile: No results for input(s): "CHOL", "HDL", "LDLCALC", "[name]", "CHOLHDL", "LDLDIRECT" in the last 72 hours. Thyroid Function Tests: No results for input(s): "TSH", "T4TOTAL", "FREET4", "T3FREE", "THYROIDAB" in the last 72 hours. Anemia Panel: Recent Labs    06/20/23 0115  RETICCTPCT 11.5*   Urine analysis:    Component Value Date/Time   COLORURINE AMBER (A) 06/20/2023 0311   APPEARANCEUR CLOUDY (A) 06/20/2023 0311   LABSPEC 1.014 06/20/2023 0311   PHURINE 5.0 06/20/2023  0311   GLUCOSEU NEGATIVE 06/20/2023 0311   HGBUR MODERATE (A) 06/20/2023 0311   BILIRUBINUR NEGATIVE 06/20/2023 0311   BILIRUBINUR small (A) 03/18/2021 1218   BILIRUBINUR neg 11/27/2019 1149   KETONESUR NEGATIVE 06/20/2023 0311   PROTEINUR 100 (A) 06/20/2023 0311   UROBILINOGEN 2.0 (A) 03/18/2021 1218   UROBILINOGEN 4.0 (H) 05/21/2009 1944   NITRITE NEGATIVE 06/20/2023 0311   LEUKOCYTESUR NEGATIVE 06/20/2023 0311    Radiological Exams on Admission: DG Chest 2 View Result Date: 06/19/2023 CLINICAL DATA:  Sickle cell pain crisis EXAM: CHEST - 2 VIEW COMPARISON:  03/24/2023, CT 09/25/2022, chest x-ray 08/12/2021 FINDINGS: Right-sided central venous port tip at the SVC. Chronic interstitial opacities. Slight increased ground-glass opacity in the left mid to lower lung. Stable cardiomediastinal silhouette. No pneumothorax IMPRESSION: Underlying chronic changes. Possible more acute ground-glass airspace disease in the left mid to lower lung. Electronically Signed   By: Jasmine Pang M.D.   On: 06/19/2023 23:30    EKG: Ordered and currently pending.  Assessment and Plan  Sepsis secondary to influenza A with possible pneumonia Acute on chronic hypoxemic respiratory failure Met SIRS criteria with fever, tachycardia, and leukocytosis.  Hypotensive in the ED with SBP as low as 80s.  Lactate normal.  Patient is on 3 L oxygen PRN at baseline and currently requiring 6 L to maintain sats in the 90s.  Influenza A PCR positive.  Chest x-ray showing underlying chronic changes with possible more acute groundglass airspace disease in the left mid to lower lung.  Patient was given 3 L IV fluid boluses in the ED and SBP now >100.  Tachycardia improved. -Continue Tamiflu x 5 days -Blood cultures ordered and started antibiotics at this time including ceftriaxone and azithromycin.  EKG ordered to check QT interval. -Check procalcitonin level -Trend WBC count and lactate -Acetaminophen as needed for  fevers -Bronchodilator as needed -Robitussin DM as needed for cough -Continue supplemental oxygen, wean as tolerated -Droplet and contact precautions  AKI Likely prerenal from dehydration in setting of acute illness/poor p.o. intake. BUN 51, creatinine 2.4 (baseline 0.6-0.8).  Continue IV fluid hydration.  Monitor renal function and urine output.  Avoid nephrotoxic agents.  Sickle cell pain crisis Patient is endorsing severe 10 out of 10 intensity diffuse body pain despite receiving multiple doses of IV Dilaudid.  Start Dilaudid PCA and continue IV fluid hydration with LR at 100 mL/hr. Monitor vital signs closely and reevaluate pain scale regularly.  Chest pain Recurrent PE less likely as patient reports compliance with Eliquis.  Stat EKG and troponin ordered.  Anemia of chronic disease Hemoglobin stable at 9.2 and no indication for blood transfusion at this time.  Type and screen, continue to monitor H&H.  Mild metabolic acidosis In the setting of AKI.  Continue IV fluid hydration and monitor labs.  Mild hyponatremia Continue IV fluid hydration and monitor labs.  Elevated liver enzymes Likely due to sepsis.  No history of alcohol abuse.  T. bili is chronically elevated in setting of sickle cell disease and appears improved.  Alk phos normal.  Acute hepatitis panel ordered and monitor LFTs.  History of PE Continue Eliquis after pharmacy med rec is done.  HIV: Last CD4 count 720 in June 2024. Anxiety GERD Continue home medications after pharmacy med rec is done.  DVT prophylaxis: Continue Eliquis after pharmacy med rec is done. Code Status: Full Code (discussed with the patient) Family Communication: No family available at this time. Level of care: Step Down Unit Admission status: It is my clinical opinion that referral for OBSERVATION is reasonable and necessary in this patient based on the above information provided. The aforementioned taken together are felt to place the  patient at high risk for further clinical deterioration. However, it is anticipated that the patient may be medically stable for discharge from the hospital within 24 to 48 hours.  John Giovanni MD [facility]  If 7PM-7AM, please contact night-coverage [email or web address]  06/20/2023, 6:07 AM

## 2023-06-20 NOTE — Progress Notes (Signed)
 Patient admitted for flu symptoms with sickle cell exacerbation, he is alert and oriented, able to make needs known, reported level 10/10 pain, PCA currently on with continuous O2 and EtCO2 in place, no acute distress noted,  blood cultures and troponin sent while in ED, patient oriented to unit and room, verbalized understanding, in bed resting, call light in reach

## 2023-06-20 NOTE — Plan of Care (Signed)

## 2023-06-20 NOTE — ED Provider Notes (Signed)
 [facility]MENT AT [facility] Provider Note   CSN: [id number] Arrival date & time: 06/19/23  2217     History  Chief Complaint  Patient presents with   Sickle Cell Pain Crisis    Martin [name] is a 33 y.o. male.  The history is provided by the patient.  Patient with history of sickle cell disease, HIV presents for a pain crisis.  Patient reports that over the past day he has had diffuse body pain.  Reports cough and shortness of breath.  No chest pain, no vomiting.  Patient also reports a fever.  Patient reports he is on home 3 L nasal cannula at baseline    Past Medical History:  Diagnosis Date   Anxiety    HIV (human immunodeficiency virus infection) (HCC)    Proteinuria    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HCC)    Vitamin D deficiency 10/2018    Home Medications Prior to Admission medications   Medication Sig Start Date End Date Taking? Authorizing Provider  abacavir (ZIAGEN) 300 MG tablet Take 600 mg by mouth at bedtime. 12/30/22   [provider]  apixaban (ELIQUIS) 5 MG TABS tablet Take 1 tablet (5 mg total) by mouth 2 (two) times daily. 12/02/21   Massie Maroon, [name]  cyclobenzaprine (FLEXERIL) 10 MG tablet Take 10 mg by mouth 2 (two) times daily as needed. 12/22/22   [provider]  Deferiprone (FERRIPROX) 500 MG TABS Take 1 tablet by mouth daily. Patient not taking: Reported on 01/16/2023 10/19/22   Massie Maroon, [name]  ergocalciferol (VITAMIN D2) 1.25 MG (50000 UT) capsule Take 1 capsule (50,000 Units total) by mouth once a week. Patient taking differently: Take 50,000 Units by mouth once a week. Tues 10/14/22   Massie Maroon, [name]  folic acid (FOLVITE) 1 MG tablet Take 1 mg by mouth daily. 10/10/21   [provider]  gabapentin (NEURONTIN) 300 MG capsule Take 300 mg by mouth at bedtime as needed.    [provider]  hydroxyurea (HYDREA) 500 MG capsule Take 4 capsules (2,000 mg total) by mouth at bedtime.  05/27/23   Quentin Angst, MD  lamivudine (EPIVIR) 300 MG tablet Take 300 mg by mouth at bedtime. Patient not taking: Reported on 03/24/2023 12/30/22   [provider]  NARCAN 4 MG/0.1ML LIQD nasal spray kit Place 1 spray into the nose once. 01/07/23   [provider]  oxyCODONE-acetaminophen (PERCOCET) 10-325 MG tablet Take 1 tablet by mouth every 4 (four) hours as needed for up to 15 days for pain. 06/13/23 07/04/23  Donell Beers, [name]  pantoprazole (PROTONIX) 40 MG tablet Take 40 mg by mouth daily as needed. 12/25/22   [provider]  sulfamethoxazole-trimethoprim (BACTRIM DS) 800-160 MG tablet Take 1 tablet by mouth daily. 03/09/23   [provider]  TIVICAY 50 MG tablet Take 50 mg by mouth at bedtime. 12/30/22   [provider]  triamcinolone cream (KENALOG) 0.1 % Apply 1 Application topically 2 (two) times daily. 01/12/23   Massie Maroon, [name]      Allergies    Ketorolac tromethamine, Tape, and Wound dressing adhesive    Review of Systems   Review of Systems  Constitutional:  Positive for fatigue and fever.  Respiratory:  Positive for cough and shortness of breath.   Cardiovascular:  Negative for chest pain.  Musculoskeletal:  Positive for myalgias. Negative for joint swelling.    Physical Exam Updated Vital Signs BP  108/70   Pulse 86   Temp 98.7 F (37.1 C) (Oral)   Resp 10   Ht 1.905 m (6\' 3" )   Wt 63.5 kg   SpO2 92%   BMI 17.50 kg/m  Physical Exam CONSTITUTIONAL: Well developed/well nourished, no distress HEAD: Normocephalic/atraumatic EYES: EOMI/PERRL ENMT: Mucous membranes moist, no stridor or drooling NECK: supple no meningeal signs CV: S1/S2 noted, no murmurs/rubs/gallops noted LUNGS: Lungs are clear to auscultation bilaterally, no apparent distress ABDOMEN: soft, nontender NEURO: Pt is awake/alert/appropriate, moves all extremitiesx4.  No facial droop.   EXTREMITIES: pulses normal/equal, full ROM, no joint  effusions, no edema SKIN: warm, color normal PSYCH: no abnormalities of mood noted, alert and oriented to situation  ED Results / Procedures / Treatments   Labs (all labs ordered are listed, but only abnormal results are displayed) Labs Reviewed  RESP PANEL BY RT-PCR (RSV, FLU A&B, COVID)  RVPGX2 - Abnormal; Notable for the following components:      Result Value   Influenza A by PCR POSITIVE (*)    All other components within normal limits  COMPREHENSIVE METABOLIC PANEL - Abnormal; Notable for the following components:   Sodium 132 (*)    CO2 20 (*)    Glucose, Bld 108 (*)    BUN 51 (*)    Creatinine, Ser 2.45 (*)    Calcium 8.2 (*)    Total Protein 8.6 (*)    AST 149 (*)    ALT 45 (*)    Total Bilirubin 4.4 (*)    GFR, Estimated 35 (*)    All other components within normal limits  CBC WITH DIFFERENTIAL/PLATELET - Abnormal; Notable for the following components:   WBC 13.6 (*)    RBC 2.81 (*)    Hemoglobin 9.2 (*)    HCT 25.4 (*)    MCHC 36.2 (*)    RDW 24.3 (*)    nRBC 8.7 (*)    Neutro Abs 8.8 (*)    Abs Immature Granulocytes 0.08 (*)    All other components within normal limits  RETICULOCYTES - Abnormal; Notable for the following components:   Retic Ct Pct 11.5 (*)    RBC. 1.92 (*)    Retic Count, Absolute 220.0 (*)    All other components within normal limits  URINALYSIS, W/ REFLEX TO CULTURE (INFECTION SUSPECTED)  URINALYSIS, ROUTINE W REFLEX MICROSCOPIC  I-STAT CG4 LACTIC ACID, ED    EKG None  Radiology DG Chest 2 View Result Date: 06/19/2023 CLINICAL DATA:  Sickle cell pain crisis EXAM: CHEST - 2 VIEW COMPARISON:  03/24/2023, CT 09/25/2022, chest x-ray 08/12/2021 FINDINGS: Right-sided central venous port tip at the SVC. Chronic interstitial opacities. Slight increased ground-glass opacity in the left mid to lower lung. Stable cardiomediastinal silhouette. No pneumothorax IMPRESSION: Underlying chronic changes. Possible more acute ground-glass airspace disease  in the left mid to lower lung. Electronically Signed   By: Jasmine Pang M.D.   On: 06/19/2023 23:30    Procedures .Critical Care  Performed by: Zadie Rhine, MD Authorized by: Zadie Rhine, MD   Critical care provider statement:    Critical care time (minutes):  60   Critical care start time:  06/19/2023 11:30 PM   Critical care end time:  06/20/2023 12:30 AM   Critical care time was exclusive of:  Separately billable procedures and treating other patients   Critical care was necessary to treat or prevent imminent or life-threatening deterioration of the following conditions:  Respiratory failure, sepsis and shock  Critical care was time spent personally by me on the following activities:  Obtaining history from patient or surrogate, examination of patient, evaluation of patient's response to treatment, development of treatment plan with patient or surrogate, pulse oximetry, ordering and review of radiographic studies, ordering and review of laboratory studies and ordering and performing treatments and interventions   I assumed direction of critical care for this patient from another provider in my specialty: no     Care discussed with: admitting provider       Medications Ordered in ED Medications  diphenhydrAMINE (BENADRYL) capsule 25-50 mg (50 mg Oral Given 06/20/23 0139)  ondansetron (ZOFRAN) injection 4 mg (4 mg Intravenous Given 06/20/23 0155)  sodium chloride 0.9 % bolus 1,000 mL (has no administration in time range)  sodium chloride 0.9 % bolus 1,000 mL (1,000 mLs Intravenous New Bag/Given 06/20/23 0130)  acetaminophen (TYLENOL) tablet 650 mg (650 mg Oral Given 06/20/23 0132)  HYDROmorphone (DILAUDID) injection 2 mg (2 mg Intravenous Given 06/20/23 0115)  HYDROmorphone (DILAUDID) injection 2 mg (2 mg Intravenous Given 06/20/23 0139)  HYDROmorphone (DILAUDID) injection 2 mg (2 mg Intravenous Given 06/20/23 0328)  oseltamivir (TAMIFLU) capsule 75 mg (75 mg Oral Given 06/20/23 0139)  sodium  chloride 0.9 % bolus 1,000 mL (1,000 mLs Intravenous New Bag/Given 06/20/23 0330)    ED Course/ Medical Decision Making/ A&P Clinical Course as of 06/20/23 0410  Sun Jun 20, 2023  0052 Patient presents for what he terms is a pain crisis, but he is febrile, coughing reports myalgias.  Strong suspicion this represents influenza. Labs and imaging been ordered.  Will await viral testing prior to initiating antibiotics [DW]  0145 Patient is positive for influenza.  Will start Tamiflu [DW]  0223 Due to multiple underlying comorbidities, patient will be admitted for influenza.  Patient reports being on oxygen daily  I suspect influenza has triggered an vaso-occlusive crisis. Will defer antibiotics for now [DW]  0240 Creatinine(!): 2.45 Acute kidney injury [DW]  0409 Patient found to have acute kidney injury.  Patient denies any new or med changes, but is had very little intake due to feeling ill.  No vomiting or diarrhea. [DW]    Clinical Course User Index [DW] Zadie Rhine, MD                                 Medical Decision Making Amount and/or Complexity of Data Reviewed Labs: ordered. Decision-making details documented in ED Course. Radiology: ordered.  Risk OTC drugs. Prescription drug management. Decision regarding hospitalization.   This patient presents to the ED for concern of fever, myalgias, this involves an extensive number of treatment options, and is a complaint that carries with it a high risk of complications and morbidity.  The differential diagnosis includes but is not limited to vaso-occlusive crisis, influenza, COVID-19, pneumonia, acute chest syndrome, sepsis  Comorbidities that complicate the patient evaluation: Patient's presentation is complicated by their history of sickle cell disease,, HIV, pulmonary embolism on anticoagulation  Social Determinants of Health: Patient's  frequent ER visits   increases the complexity of managing their  presentation  Additional history obtained: Records reviewed previous admission documents  Lab Tests: I Ordered, and personally interpreted labs.  The pertinent results include: Acute kidney injury, dehydration, anemia  Imaging Studies ordered: I ordered imaging studies including X-ray chest   I independently visualized and interpreted imaging which showed chronic findings, no evidence of CHF or pneumonia I  agree with the radiologist interpretation  Medicines ordered and prescription drug management: I ordered medication including Dilaudid for occlusive pain Reevaluation of the patient after these medicines showed that the patient    improved   Critical Interventions:   fluids, admission  Consultations Obtained: I requested consultation with the admitting physician [facility] , and discussed  findings as well as pertinent plan - they recommend: admit to dr Loney Loh  Reevaluation: After the interventions noted above, I reevaluated the patient and found that they have :improved  Complexity of problems addressed: Patient's presentation is most consistent with  acute presentation with potential threat to life or bodily function  Disposition: After consideration of the diagnostic results and the patient's response to treatment,  I feel that the patent would benefit from admission   .    Overall patient is been stabilized in the ER, he is in no acute respiratory distress, he is on his home oxygen patient is not septic appearing       Final Clinical Impression(s) / ED Diagnoses Final diagnoses:  Influenza A  Vasoocclusive sickle cell crisis (HCC)  AKI (acute kidney injury) [facility])    Rx / DC Orders ED Discharge Orders     None         Zadie Rhine, MD 06/20/23 [phone]

## 2023-06-21 MED ORDER — CYCLOBENZAPRINE HCL 10 MG PO TABS: 10.0000 mg | ORAL_TABLET | Freq: Two times a day (BID) | ORAL | Status: DC | PRN

## 2023-06-21 MED ORDER — FOLIC ACID 1 MG PO TABS
1.0000 mg | ORAL_TABLET | Freq: Every day | ORAL | Status: DC
Start: 2023-06-21 — End: 2023-06-23
  Administered 2023-06-22: 1 mg via ORAL
  Filled 2023-06-21: qty 1

## 2023-06-21 MED ORDER — APIXABAN 5 MG PO TABS
5.0000 mg | ORAL_TABLET | Freq: Two times a day (BID) | ORAL | Status: DC
  Administered 2023-06-21 – 2023-06-22 (×2): 5 mg via ORAL
  Filled 2023-06-21 (×2): qty 1

## 2023-06-21 MED ORDER — OXYCODONE HCL 5 MG PO TABS: 15.0000 mg | ORAL_TABLET | Freq: Four times a day (QID) | ORAL | Status: DC | PRN

## 2023-06-21 MED ORDER — ENOXAPARIN SODIUM 30 MG/0.3ML IJ SOSY
30.0000 mg | PREFILLED_SYRINGE | INTRAMUSCULAR | Status: DC
  Filled 2023-06-21: qty 0.3

## 2023-06-21 MED ORDER — CARMEX CLASSIC LIP BALM EX OINT
1.0000 | TOPICAL_OINTMENT | CUTANEOUS | Status: DC | PRN
Start: 2023-06-21 — End: 2023-06-23
  Administered 2023-06-22: 1 via TOPICAL
  Filled 2023-06-21 (×2): qty 10

## 2023-06-21 MED ORDER — ABACAVIR SULFATE 300 MG PO TABS: 600.0000 mg | ORAL_TABLET | Freq: Every day | ORAL | Status: DC

## 2023-06-21 MED ORDER — GABAPENTIN 300 MG PO CAPS: 300.0000 mg | ORAL_CAPSULE | Freq: Every evening | ORAL | Status: DC | PRN

## 2023-06-21 MED ORDER — PANTOPRAZOLE SODIUM 40 MG PO TBEC: 40.0000 mg | DELAYED_RELEASE_TABLET | Freq: Every day | ORAL | Status: DC | PRN

## 2023-06-21 MED ORDER — HYDROXYUREA 500 MG PO CAPS
2000.0000 mg | ORAL_CAPSULE | Freq: Every day | ORAL | Status: DC
  Administered 2023-06-21: 2000 mg via ORAL
  Filled 2023-06-21: qty 4

## 2023-06-21 MED ORDER — DOLUTEGRAVIR SODIUM 50 MG PO TABS
50.0000 mg | ORAL_TABLET | Freq: Every day | ORAL | Status: DC
  Administered 2023-06-21: 50 mg via ORAL
  Filled 2023-06-21 (×2): qty 1

## 2023-06-21 NOTE — Progress Notes (Addendum)
 Patient ID: Martin [name], male   DOB: 1991/02/01, 33 y.o.   MRN: [id number] Subjective: Martin [name] is a 33 year old male with a medical history significant for sickle cell disease, chronic pain syndrome, HIV, anxiety, THC dependency, GERD, PE on Eliquis, chronic hypoxemic respiratory failure on 3 L home oxygen presented to the emergency department with complaints of generalized body pain, cough and shortness of breath.  Patient reports he has been feeling ill for the past 2 days.  Having fevers, chills, cough, mild shortness of breath, substernal chest pain, decreased appetite and severe diffuse body pain. he denies nausea, vomiting or diarrhea.  Positive for influenza A.  Patient reports improved pain today 8/10 in lower abdomen, and back.no new complain. He denies shortness of breath, nausea or vomiting  Vital signs in last 24 hours:  Vitals:   06/21/23 0322 06/21/23 0419 06/21/23 0811 06/21/23 1134  BP:  105/62    Pulse:  94    Resp: 16 20 16 16   Temp:  100.2 F (37.9 C)    TempSrc:  Oral    SpO2: 93% 91% 90% 91%  Weight:      Height:        Intake/Output from previous day:   Intake/Output Summary (Last 24 hours) at 06/21/2023 1246 Last data filed at 06/21/2023 0630 Gross per 24 hour  Intake 2855.25 ml  Output 675 ml  Net 2180.25 ml    Physical Exam: General: Alert, awake, oriented x3, in no acute distress.  HEENT: [place]/AT PEERL, EOMI Neck: Trachea midline,  no masses, no thyromegal,y no JVD, no carotid bruit OROPHARYNX:  Moist, No exudate/ erythema/lesions.  Heart: Regular rate and rhythm, without murmurs, rubs, gallops, PMI non-displaced, no heaves or thrills on palpation.  Lungs: Clear to auscultation, no wheezing or rhonchi noted. No increased vocal fremitus resonant to percussion  Abdomen: Soft, nontender, nondistended, positive bowel sounds, no masses no hepatosplenomegaly noted..  Neuro: No focal neurological deficits noted cranial nerves II through XII grossly intact.  DTRs 2+ bilaterally upper and lower extremities. Strength 5 out of 5 in bilateral upper and lower extremities. Musculoskeletal: No warm swelling or erythema around joints, no spinal tenderness noted. Psychiatric: Patient alert and oriented x3, good insight and cognition, good recent to remote recall. Lymph node survey: No cervical axillary or inguinal lymphadenopathy noted.  Lab Results:  Basic Metabolic Panel:    Component Value Date/Time   NA 134 (L) 06/20/2023 0803   NA 140 01/12/2023 1607   K 4.8 06/20/2023 0803   CL 105 06/20/2023 0803   CO2 21 (L) 06/20/2023 0803   BUN 35 (H) 06/20/2023 0803   BUN 8 01/12/2023 1607   CREATININE 1.58 (H) 06/20/2023 0803   GLUCOSE 92 06/20/2023 0803   CALCIUM 7.9 (L) 06/20/2023 0803   CBC:    Component Value Date/Time   WBC 13.1 (H) 06/20/2023 0803   HGB 7.6 (L) 06/20/2023 0803   HGB 7.4 (L) 01/12/2023 1607   HCT 21.1 (L) 06/20/2023 0803   HCT 21.7 (L) 01/12/2023 1607   PLT 145 (L) 06/20/2023 0803   PLT 321 01/12/2023 1607   MCV 91.3 06/20/2023 0803   MCV 94 01/12/2023 1607   NEUTROABS 8.8 (H) 06/20/2023 0115   NEUTROABS 3.8 01/12/2023 1607   LYMPHSABS 3.7 06/20/2023 0115   LYMPHSABS 5.6 (H) 01/12/2023 1607   MONOABS 1.0 06/20/2023 0115   EOSABS 0.0 06/20/2023 0115   EOSABS 0.2 01/12/2023 1607   BASOSABS 0.1 06/20/2023 0115   BASOSABS 0.1 01/12/2023 1607  Recent Results (from the past 240 hours)  Resp panel by RT-PCR (RSV, Flu A&B, Covid) Anterior Nasal Swab     Status: Abnormal   Collection Time: 06/19/23 11:29 PM   Specimen: Anterior Nasal Swab  Result Value Ref Range Status   SARS Coronavirus 2 by RT PCR NEGATIVE NEGATIVE Final    Comment: (NOTE) SARS-CoV-2 target nucleic acids are NOT DETECTED.  The SARS-CoV-2 RNA is generally detectable in upper respiratory specimens during the acute phase of infection. The lowest concentration of SARS-CoV-2 viral copies this assay can detect is 138 copies/mL. A negative result does  not preclude SARS-Cov-2 infection and should not be used as the sole basis for treatment or other patient management decisions. A negative result may occur with  improper specimen collection/handling, submission of specimen other than nasopharyngeal swab, presence of viral mutation(s) within the areas targeted by this assay, and inadequate number of viral copies(<138 copies/mL). A negative result must be combined with clinical observations, patient history, and epidemiological information. The expected result is Negative.  Fact Sheet for Patients:  BloggerCourse.com  Fact Sheet for Healthcare Providers:  SeriousBroker.it  This test is no t yet approved or cleared by the [place] FDA and  has been authorized for detection and/or diagnosis of SARS-CoV-2 by FDA under an Emergency Use Authorization (EUA). This EUA will remain  in effect (meaning this test can be used) for the duration of the COVID-19 declaration under Section 564(b)(1) of the Act, 21 U.S.C.section 360bbb-3(b)(1), unless the authorization is terminated  or revoked sooner.       Influenza A by PCR POSITIVE (A) NEGATIVE Final   Influenza B by PCR NEGATIVE NEGATIVE Final    Comment: (NOTE) The Xpert Xpress SARS-CoV-2/FLU/RSV plus assay is intended as an aid in the diagnosis of influenza from Nasopharyngeal swab specimens and should not be used as a sole basis for treatment. Nasal washings and aspirates are unacceptable for Xpert Xpress SARS-CoV-2/FLU/RSV testing.  Fact Sheet for Patients: BloggerCourse.com  Fact Sheet for Healthcare Providers: SeriousBroker.it  This test is not yet approved or cleared by the [place] FDA and has been authorized for detection and/or diagnosis of SARS-CoV-2 by FDA under an Emergency Use Authorization (EUA). This EUA will remain in effect (meaning this test can be used) for the  duration of the COVID-19 declaration under Section 564(b)(1) of the Act, 21 U.S.C. section 360bbb-3(b)(1), unless the authorization is terminated or revoked.     Resp Syncytial Virus by PCR NEGATIVE NEGATIVE Final    Comment: (NOTE) Fact Sheet for Patients: BloggerCourse.com  Fact Sheet for Healthcare Providers: SeriousBroker.it  This test is not yet approved or cleared by the [place] FDA and has been authorized for detection and/or diagnosis of SARS-CoV-2 by FDA under an Emergency Use Authorization (EUA). This EUA will remain in effect (meaning this test can be used) for the duration of the COVID-19 declaration under Section 564(b)(1) of the Act, 21 U.S.C. section 360bbb-3(b)(1), unless the authorization is terminated or revoked.  Performed at [facility], [address] [place], [place] [address]   Culture, blood (Routine X 2) w Reflex to ID Panel     Status: None (Preliminary result)   Collection Time: 06/20/23 11:37 AM   Specimen: BLOOD RIGHT ARM  Result Value Ref Range Status   Specimen Description   Final    BLOOD RIGHT ARM Performed at [facility], [address] [place], [place] [address]    Special Requests   Final  BOTTLES DRAWN AEROBIC AND ANAEROBIC Blood Culture adequate volume Performed at [facility], [address], [place], [place] [address]    Culture   Final    NO GROWTH < 24 HOURS Performed at [facility], [address] [place], [place] [address]    Report Status PENDING  Incomplete  Culture, blood (Routine X 2) w Reflex to ID Panel     Status: None (Preliminary result)   Collection Time: 06/20/23  7:48 PM   Specimen: BLOOD RIGHT HAND  Result Value Ref Range Status   Specimen Description   Final    BLOOD RIGHT HAND Performed at [facility], [address] [place], [place] [address]    Special Requests   Final    BOTTLES DRAWN  AEROBIC ONLY Blood Culture results may not be optimal due to an inadequate volume of blood received in culture bottles Performed at [facility], [address], [place], [place] [address]    Culture   Final    NO GROWTH < 12 HOURS Performed at [facility], [address], [place], [place] [address]    Report Status PENDING  Incomplete    Studies/Results: DG Chest 2 View Result Date: 06/19/2023 CLINICAL DATA:  Sickle cell pain crisis EXAM: CHEST - 2 VIEW COMPARISON:  03/24/2023, CT 09/25/2022, chest x-ray 08/12/2021 FINDINGS: Right-sided central venous port tip at the SVC. Chronic interstitial opacities. Slight increased ground-glass opacity in the left mid to lower lung. Stable cardiomediastinal silhouette. No pneumothorax IMPRESSION: Underlying chronic changes. Possible more acute ground-glass airspace disease in the left mid to lower lung. Electronically Signed   By: Jasmine Pang M.D.   On: 06/19/2023 23:30    Medications: Scheduled Meds:  enoxaparin (LOVENOX) injection  30 mg Subcutaneous Q24H   HYDROmorphone   Intravenous Q4H   oseltamivir  30 mg Oral BID   senna-docusate  1 tablet Oral BID   Continuous Infusions:  azithromycin 500 mg (06/21/23 1027)   cefTRIAXone (ROCEPHIN)  IV 1 g (06/21/23 1134)   PRN Meds:.acetaminophen, albuterol, diphenhydrAMINE, guaiFENesin-dextromethorphan, lip balm, naloxone **AND** sodium chloride flush, ondansetron (ZOFRAN) IV, polyethylene glycol  Consultants: None  Procedures: None  Antibiotics: Azithromycin  Assessment/Plan: Principal Problem:   Sickle cell pain crisis (HCC) Active Problems:   HIV (human immunodeficiency virus infection) (HCC)   History of pulmonary embolism   Influenza A with pneumonia   Acute hypoxemic respiratory failure (HCC)   AKI (acute kidney injury) (HCC)   Influenza A with respiratory manifestations  Hb Sickle Cell Disease with Pain crisis: Reduce IVF to [facility], continue weight based  Dilaudid PCA at current dose setting, IV Toradol is contraindicated due to AKI, Monitor vitals very closely, Re-evaluate pain scale regularly, 2 L of Oxygen by [place]. Leukocytosis: Continue azithromycin 500 mg and ceftriaxone 1 g Anemia of Chronic Disease: Hgb is stable at baseline today. There's no clinical indication for blood transfusion at this time. Will continue to monitor closely Chronic pain Syndrome: Continue home medication       Code Status: Full Code Family Communication: N/A Disposition Plan: Not yet ready for discharge  Daryll Drown NP  If 7PM-7AM, please contact night-coverage.  06/21/2023, 12:46 PM  LOS: 1 day   Evaluation and management procedures were performed by the Advanced Practitioner under my supervision and collaboration. I have reviewed the Advanced Practitioner's note and chart, and I agree with the management and plan.   Jeanann Lewandowsky, MD, MHA, CPE, FACP,  [facility] [place], [place] [phone] 06/21/2023, 4:59 PM

## 2023-06-22 ENCOUNTER — Other Ambulatory Visit (HOSPITAL_COMMUNITY): Payer: Self-pay

## 2023-06-22 LAB — COMPREHENSIVE METABOLIC PANEL
ALT: 27 U/L (ref 0–44)
AST: 53 U/L — ABNORMAL HIGH (ref 15–41)
Albumin: 3.4 g/dL — ABNORMAL LOW (ref 3.5–5.0)
Alkaline Phosphatase: 35 U/L — ABNORMAL LOW (ref 38–126)
Anion gap: 7 (ref 5–15)
BUN: 10 mg/dL (ref 6–20)
CO2: 24 mmol/L (ref 22–32)
Calcium: 8 mg/dL — ABNORMAL LOW (ref 8.9–10.3)
Chloride: 101 mmol/L (ref 98–111)
Creatinine, Ser: 0.58 mg/dL — ABNORMAL LOW (ref 0.61–1.24)
GFR, Estimated: >60 mL/min (ref >60)
Glucose, Bld: 114 mg/dL — ABNORMAL HIGH (ref 70–99)
Potassium: 3.7 mmol/L (ref 3.5–5.1)
Sodium: 132 mmol/L — ABNORMAL LOW (ref 135–145)
Total Bilirubin: 2.1 mg/dL — ABNORMAL HIGH (ref 0.0–1.2)
Total Protein: 7.4 g/dL (ref 6.5–8.1)

## 2023-06-22 LAB — CBC
HCT: 21.1 % — ABNORMAL LOW (ref 39.0–52.0)
Hemoglobin: 7.6 g/dL — ABNORMAL LOW (ref 13.0–17.0)
MCH: 34.2 pg — ABNORMAL HIGH (ref 26.0–34.0)
MCHC: 36 g/dL (ref 30.0–36.0)
MCV: 95 fL (ref 80.0–100.0)
Platelets: 216 10*3/uL (ref 150–400)
RBC: 2.22 MIL/uL — ABNORMAL LOW (ref 4.22–5.81)
RDW: 25.3 % — ABNORMAL HIGH (ref 11.5–15.5)
WBC: 8.7 10*3/uL (ref 4.0–10.5)
nRBC: 32.4 % — ABNORMAL HIGH (ref 0.0–0.2)

## 2023-06-22 MED ORDER — OSELTAMIVIR PHOSPHATE 75 MG PO CAPS
75.0000 mg | ORAL_CAPSULE | Freq: Two times a day (BID) | ORAL | Status: DC
  Filled 2023-06-22: qty 1

## 2023-06-22 MED ORDER — AZITHROMYCIN 250 MG PO TABS
ORAL_TABLET | ORAL | 0 refills | Status: DC
  Filled 2023-06-22: qty 6, 5d supply, fill #0

## 2023-06-22 MED ORDER — BISACODYL 10 MG RE SUPP
10.0000 mg | Freq: Once | RECTAL | 0 refills | Status: AC
Start: 2023-06-22 — End: 2023-06-22
  Filled 2023-06-22: qty 12, 12d supply, fill #0

## 2023-06-22 MED ORDER — BISACODYL 10 MG RE SUPP
10.0000 mg | Freq: Once | RECTAL | Status: AC
  Administered 2023-06-22: 10 mg via RECTAL
  Filled 2023-06-22: qty 1

## 2023-06-22 MED ORDER — OSELTAMIVIR PHOSPHATE 75 MG PO CAPS
75.0000 mg | ORAL_CAPSULE | Freq: Two times a day (BID) | ORAL | 0 refills | Status: AC
  Filled 2023-06-22: qty 14, 7d supply, fill #0

## 2023-06-22 NOTE — Progress Notes (Addendum)
 Patient ID: Martin [name], male   DOB: July 30, 1990, 33 y.o.   MRN: [id number] Subjective: [name] is a 89 old male with a medical history significant for sickle cell disease, chronic pain syndrome, HIV, anxiety, THC dependency, GERD, PE on Eliquis, chronic hypoxemic respiratory failure on 3 L home oxygen presented to the emergency department with complaints of generalized body pain, cough and shortness of breath.  Patient reported he has been feeling ill in the past 2 days.  Having fevers, chills, cough, mild shortness of breath, substernal chest pain, decreased appetite and severe diffuse body pain.  He denies nausea vomiting or diarrhea.  Positive for influenza A. Patient reports improved pain today 7/10 in lower abdomen and back.  No new complaint.  He denies shortness of breath, nausea or vomiting.    Objective:   Vital signs in last 24 hours:    Intake/Output from previous day:  No intake or output data in the 24 hours ending 07/02/23 1624   Physical Exam: General: Alert, awake, oriented x3, in no acute distress.  HEENT: [place]/AT PEERL, EOMI Neck: Trachea midline,  no masses, no thyromegal,y no JVD, no carotid bruit OROPHARYNX:  Moist, No exudate/ erythema/lesions.  Heart: Regular rate and rhythm, without murmurs, rubs, gallops, PMI non-displaced, no heaves or thrills on palpation.  Lungs: Clear to auscultation, no wheezing or rhonchi noted. No increased vocal fremitus resonant to percussion  Abdomen: Soft, nontender, nondistended, positive bowel sounds, no masses no hepatosplenomegaly noted..  Neuro: No focal neurological deficits noted cranial nerves II through XII grossly intact. DTRs 2+ bilaterally upper and lower extremities. Strength 5 out of 5 in bilateral upper and lower extremities. Musculoskeletal: No warm swelling or erythema around joints, generalize body ache.  Psychiatric: Patient alert and oriented x3, good insight and cognition, good recent to remote recall. Lymph  node survey: No cervical axillary or inguinal lymphadenopathy noted.  Lab Results:  Basic Metabolic Panel:    Component Value Date/Time   NA 132 (L) 06/22/2023 0500   NA 140 01/12/2023 1607   K 3.7 06/22/2023 0500   CL 101 06/22/2023 0500   CO2 24 06/22/2023 0500   BUN 10 06/22/2023 0500   BUN 8 01/12/2023 1607   CREATININE 0.58 (L) 06/22/2023 0500   GLUCOSE 114 (H) 06/22/2023 0500   CALCIUM 8.0 (L) 06/22/2023 0500   CBC:    Component Value Date/Time   WBC 8.7 06/22/2023 0500   HGB 7.6 (L) 06/22/2023 0500   HGB 7.4 (L) 01/12/2023 1607   HCT 21.1 (L) 06/22/2023 0500   HCT 21.7 (L) 01/12/2023 1607   PLT 216 06/22/2023 0500   PLT 321 01/12/2023 1607   MCV 95.0 06/22/2023 0500   MCV 94 01/12/2023 1607   NEUTROABS 8.8 (H) 06/20/2023 0115   NEUTROABS 3.8 01/12/2023 1607   LYMPHSABS 3.7 06/20/2023 0115   LYMPHSABS 5.6 (H) 01/12/2023 1607   MONOABS 1.0 06/20/2023 0115   EOSABS 0.0 06/20/2023 0115   EOSABS 0.2 01/12/2023 1607   BASOSABS 0.1 06/20/2023 0115   BASOSABS 0.1 01/12/2023 1607    No results found for this or any previous visit (from the past 240 hours).   Studies/Results: No results found.  Medications: Scheduled Meds:   Continuous Infusions:   PRN Meds:.  Consultants: None  Procedures: None  Antibiotics: Azithromycin Ceftriaxone  Assessment/Plan: Principal Problem:   Sickle cell pain crisis (HCC) Active Problems:   HIV (human immunodeficiency virus infection) (HCC)   History of pulmonary embolism   Influenza A with  pneumonia   Acute hypoxemic respiratory failure (HCC)   AKI (acute kidney injury) (HCC)   Influenza A with respiratory manifestations   Hb Sickle Cell Disease with Pain crisis: Continue IVF 0.45% Saline KVO .  Continue weight based Dilaudid PCA, IV, Monitor vitals very closely, Re-evaluate pain scale regularly, 2 L of Oxygen by [place]. Leukocytosis: Continue azithromycin 500 mg and ceftriaxone 1 g Anemia of Chronic Disease:  Hemoglobin is stable at baseline today.  There is no clinical indication for blood transfusion at this time we will continue to monitor closely. Chronic pain Syndrome: Continue home medication.   Code Status: Full Code Family Communication: N/A Disposition Plan: Not yet ready for discharge  Daryll Drown NP  If 7PM-7AM, please contact night-coverage.  07/02/2023, 4:24 PM  LOS: 2 days

## 2023-06-22 NOTE — Plan of Care (Signed)
 Problem: Education: Goal: Knowledge of vaso-occlusive preventative measures will improve 06/22/2023 1731 by Edward Jolly, RN Outcome: Adequate for Discharge 06/22/2023 1533 by Edward Jolly, RN Outcome: Progressing Goal: Awareness of infection prevention will improve 06/22/2023 1731 by Edward Jolly, RN Outcome: Adequate for Discharge 06/22/2023 1533 by Edward Jolly, RN Outcome: Progressing Goal: Awareness of signs and symptoms of anemia will improve 06/22/2023 1731 by Edward Jolly, RN Outcome: Adequate for Discharge 06/22/2023 1533 by Edward Jolly, RN Outcome: Progressing Goal: Long-term complications will improve 06/22/2023 1731 by Edward Jolly, RN Outcome: Adequate for Discharge 06/22/2023 1533 by Edward Jolly, RN Outcome: Progressing   Problem: Self-Care: Goal: Ability to incorporate actions that prevent/reduce pain crisis will improve 06/22/2023 1731 by Edward Jolly, RN Outcome: Adequate for Discharge 06/22/2023 1533 by Edward Jolly, RN Outcome: Progressing   Problem: Bowel/Gastric: Goal: Gut motility will be maintained 06/22/2023 1731 by Edward Jolly, RN Outcome: Adequate for Discharge 06/22/2023 1533 by Edward Jolly, RN Outcome: Progressing   Problem: Tissue Perfusion: Goal: Complications related to inadequate tissue perfusion will be avoided or minimized 06/22/2023 1731 by Edward Jolly, RN Outcome: Adequate for Discharge 06/22/2023 1533 by Edward Jolly, RN Outcome: Progressing   Problem: Respiratory: Goal: Pulmonary complications will be avoided or minimized 06/22/2023 1731 by Edward Jolly, RN Outcome: Adequate for Discharge 06/22/2023 1533 by Edward Jolly, RN Outcome: Progressing Goal: Acute Chest Syndrome will be identified early to prevent complications 06/22/2023 1731 by Edward Jolly, RN Outcome: Adequate for Discharge 06/22/2023 1533 by Edward Jolly, RN Outcome: Progressing   Problem: Fluid Volume: Goal: Ability to maintain a  balanced intake and output will improve 06/22/2023 1731 by Edward Jolly, RN Outcome: Adequate for Discharge 06/22/2023 1533 by Edward Jolly, RN Outcome: Progressing   Problem: Sensory: Goal: Pain level will decrease with appropriate interventions 06/22/2023 1731 by Edward Jolly, RN Outcome: Adequate for Discharge 06/22/2023 1533 by Edward Jolly, RN Outcome: Progressing   Problem: Health Behavior: Goal: Postive changes in compliance with treatment and prescription regimens will improve 06/22/2023 1731 by Edward Jolly, RN Outcome: Adequate for Discharge 06/22/2023 1533 by Edward Jolly, RN Outcome: Progressing   Problem: Education: Goal: Knowledge of General Education information will improve Description: Including pain rating scale, medication(s)/side effects and non-pharmacologic comfort measures 06/22/2023 1731 by Edward Jolly, RN Outcome: Adequate for Discharge 06/22/2023 1533 by Edward Jolly, RN Outcome: Progressing   Problem: Health Behavior/Discharge Planning: Goal: Ability to manage health-related needs will improve 06/22/2023 1731 by Edward Jolly, RN Outcome: Adequate for Discharge 06/22/2023 1533 by Edward Jolly, RN Outcome: Progressing   Problem: Clinical Measurements: Goal: Ability to maintain clinical measurements within normal limits will improve 06/22/2023 1731 by Edward Jolly, RN Outcome: Adequate for Discharge 06/22/2023 1533 by Edward Jolly, RN Outcome: Progressing Goal: Will remain free from infection 06/22/2023 1731 by Edward Jolly, RN Outcome: Adequate for Discharge 06/22/2023 1533 by Edward Jolly, RN Outcome: Progressing Goal: Diagnostic test results will improve 06/22/2023 1731 by Edward Jolly, RN Outcome: Adequate for Discharge 06/22/2023 1533 by Edward Jolly, RN Outcome: Progressing Goal: Respiratory complications will improve 06/22/2023 1731 by Edward Jolly, RN Outcome: Adequate for Discharge 06/22/2023 1533 by Edward Jolly, RN Outcome: Progressing Goal: Cardiovascular complication will be avoided 06/22/2023 1731 by Edward Jolly, RN Outcome: Adequate for Discharge 06/22/2023 1533 by Edward Jolly, RN Outcome: Progressing  Problem: Activity: Goal: Risk for activity intolerance will decrease 06/22/2023 1731 by Edward Jolly, RN Outcome: Adequate for Discharge 06/22/2023 1533 by Edward Jolly, RN Outcome: Progressing   Problem: Nutrition: Goal: Adequate nutrition will be maintained 06/22/2023 1731 by Edward Jolly, RN Outcome: Adequate for Discharge 06/22/2023 1533 by Edward Jolly, RN Outcome: Progressing   Problem: Coping: Goal: Level of anxiety will decrease 06/22/2023 1731 by Edward Jolly, RN Outcome: Adequate for Discharge 06/22/2023 1533 by Edward Jolly, RN Outcome: Progressing   Problem: Elimination: Goal: Will not experience complications related to bowel motility 06/22/2023 1731 by Edward Jolly, RN Outcome: Adequate for Discharge 06/22/2023 1533 by Edward Jolly, RN Outcome: Progressing Goal: Will not experience complications related to urinary retention 06/22/2023 1731 by Edward Jolly, RN Outcome: Adequate for Discharge 06/22/2023 1533 by Edward Jolly, RN Outcome: Progressing   Problem: Pain Managment: Goal: General experience of comfort will improve and/or be controlled 06/22/2023 1731 by Edward Jolly, RN Outcome: Adequate for Discharge 06/22/2023 1533 by Edward Jolly, RN Outcome: Progressing   Problem: Safety: Goal: Ability to remain free from injury will improve 06/22/2023 1731 by Edward Jolly, RN Outcome: Adequate for Discharge 06/22/2023 1533 by Edward Jolly, RN Outcome: Progressing   Problem: Skin Integrity: Goal: Risk for impaired skin integrity will decrease 06/22/2023 1731 by Edward Jolly, RN Outcome: Adequate for Discharge 06/22/2023 1533 by Edward Jolly, RN Outcome: Progressing

## 2023-06-22 NOTE — Discharge Summary (Cosign Needed Addendum)
 Physician Discharge Summary  Martin [name] NWG:[id number] DOB: [birth year] DOA: 06/19/2023  PCP: Massie Maroon, [name]  Admit date: 06/19/2023  Discharge date: 06/22/2023  Discharge Diagnoses:  Principal Problem:   Sickle cell pain crisis (HCC) Active Problems:   HIV (human immunodeficiency virus infection) (HCC)   History of pulmonary embolism   Influenza A with pneumonia   Acute hypoxemic respiratory failure (HCC)   AKI (acute kidney injury) (HCC)   Influenza A with respiratory manifestations   Discharge Condition: Stable  Disposition:  Pt is discharged home in good condition and is to follow up with PCP this week to have labs evaluated. Martin [name] is instructed to increase activity slowly and balance with rest for the next few days, and use prescribed medication to complete treatment of pain  Diet: Regular Wt Readings from Last 3 Encounters:  06/20/23 63.5 kg  03/24/23 63.5 kg  03/17/23 73.2 kg    History of present illness:  Martin [name] is a 33 year old-male with a medical history significant for sickle cell disease, chronic pain syndrome, opiate dependency and tolerance, history of HIV, and history of PE on Xarelto.  Patient presents to the emergency department reports that over the past 24 hours he has had diffuse body pain, cough and shortness of breath.  No chest pain, no vomiting or diarrhea.  Reports fever and positive for influenza A   Hospital Course:  Patient was admitted for sickle cell pain crisis and managed appropriately with IV fluids, IV Dilaudid via PCA as well as other adjunct therapies per sickle cell pain management protocols.  Tamiflu for positive Influenza A , Antibiotics for elevated leukocytosis. WBC down to 8.7, HGB 7.6, platelet count back to normal range 216, decreased bilirubin, Fever resolved, pain symptoms significantly improved from 9/10 to 4/10. Patient was therefore discharged home today in a hemodynamically stable condition.   Martin [name] will follow-up with PCP within 1 week of this discharge. Martin [name] was counseled extensively about nonpharmacologic means of pain management, patient verbalized understanding and was appreciative of  the care received during this admission.  We discussed the need for good hydration, monitoring of hydration status, avoidance of heat, cold, stress, and infection triggers. We discussed the need to be adherent with taking home medications. Patient was reminded of the need to seek medical attention immediately if any symptom of bleeding, anemia, or infection occurs.  Discharge Exam: Vitals:   06/22/23 1650 06/22/23 1828  BP:    Pulse:    Resp: 14 14  Temp:    SpO2: 97% 94%   Vitals:   06/22/23 1115 06/22/23 1332 06/22/23 1650 06/22/23 1828  BP:  96/72    Pulse:  85    Resp: 12 17 14 14   Temp:  98 F (36.7 C)    TempSrc:  Oral    SpO2: 99% 98% 97% 94%  Weight:      Height:        General appearance : Awake, alert, not in any distress. Speech Clear. Not toxic looking HEENT: Atraumatic and Normocephalic, pupils equally reactive to light and accomodation Neck: Supple, no JVD. No cervical lymphadenopathy.  Chest: Good air entry bilaterally, no added sounds  CVS: S1 S2 regular, no murmurs.  Abdomen: Bowel sounds present, Non tender and not distended with no gaurding, rigidity or rebound. Extremities: B/L Lower Ext shows no edema, both legs are warm to touch Neurology: Awake alert, and oriented X 3, CN II-XII intact, Non focal Skin: No Rash  Discharge Instructions  Discharge Instructions     Diet - low sodium heart healthy   Complete by: As directed    Increase activity slowly   Complete by: As directed      Continue Azithromycin, and Tamiflu  Allergies as of 06/22/2023       Reactions   Ketorolac Tromethamine Swelling, Other (See Comments)   Patient reports facial edema and left arm edema after administration.   Tape Rash, Other (See Comments)   PLEASE DO NOT USE  THE CLEAR, THICK, "PLASTIC" TAPE- only paper tape is tolerated    Wound Dressing Adhesive Rash        Medication List     TAKE these medications    abacavir 300 MG tablet Commonly known as: ZIAGEN Take 600 mg by mouth at bedtime.   azithromycin 250 MG tablet Commonly known as: ZITHROMAX Take 2 tablets on day 1, then 1 tablet daily on days 2 through 5   cyclobenzaprine 10 MG tablet Commonly known as: FLEXERIL Take 10 mg by mouth 2 (two) times daily as needed for muscle spasms.   Deferiprone 500 MG Tabs Commonly known as: Ferriprox Take 1 tablet by mouth daily.   Eliquis 5 MG Tabs tablet Generic drug: apixaban Take 1 tablet (5 mg total) by mouth 2 (two) times daily.   ergocalciferol 1.25 MG (50000 UT) capsule Commonly known as: VITAMIN D2 Take 1 capsule (50,000 Units total) by mouth once a week. What changed: additional instructions   folic acid 1 MG tablet Commonly known as: FOLVITE Take 1 mg by mouth daily.   gabapentin 300 MG capsule Commonly known as: NEURONTIN Take 300 mg by mouth at bedtime as needed (nerve pain).   hydroxyurea 500 MG capsule Commonly known as: HYDREA Take 4 capsules (2,000 mg total) by mouth at bedtime.   Narcan 4 MG/0.1ML Liqd nasal spray kit Generic drug: naloxone Place 1 spray into the nose once.   pantoprazole 40 MG tablet Commonly known as: PROTONIX Take 40 mg by mouth daily as needed (acid reflux).   Tivicay 50 MG tablet Generic drug: dolutegravir Take 50 mg by mouth at bedtime.   triamcinolone cream 0.1 % Commonly known as: KENALOG Apply 1 Application topically 2 (two) times daily.       ASK your doctor about these medications    bisacodyl 10 MG suppository Commonly known as: DULCOLAX Place 1 suppository (10 mg total) rectally once for 1 dose. Ask about: Should I take this medication?   oseltamivir 75 MG capsule Commonly known as: Tamiflu Take 1 capsule (75 mg total) by mouth 2 (two) times daily for 7 days. Ask  about: Should I take this medication?        The results of significant diagnostics from this hospitalization (including imaging, microbiology, ancillary and laboratory) are listed below for reference.    Significant Diagnostic Studies: DG Chest 2 View Result Date: 06/19/2023 CLINICAL DATA:  Sickle cell pain crisis EXAM: CHEST - 2 VIEW COMPARISON:  03/24/2023, CT 09/25/2022, chest x-ray 08/12/2021 FINDINGS: Right-sided central venous port tip at the SVC. Chronic interstitial opacities. Slight increased ground-glass opacity in the left mid to lower lung. Stable cardiomediastinal silhouette. No pneumothorax IMPRESSION: Underlying chronic changes. Possible more acute ground-glass airspace disease in the left mid to lower lung. Electronically Signed   By: Jasmine Pang M.D.   On: 06/19/2023 23:30    Microbiology: No results found for this or any previous visit (from the past 240 hours).    Labs: Basic Metabolic Panel: No  results for input(s): "NA", "K", "CL", "CO2", "GLUCOSE", "BUN", "CREATININE", "CALCIUM", "MG", "PHOS" in the last 168 hours.  Liver Function Tests: No results for input(s): "AST", "ALT", "ALKPHOS", "BILITOT", "PROT", "ALBUMIN" in the last 168 hours.  No results for input(s): "LIPASE", "AMYLASE" in the last 168 hours. No results for input(s): "AMMONIA" in the last 168 hours. CBC: No results for input(s): "WBC", "NEUTROABS", "HGB", "HCT", "MCV", "PLT" in the last 168 hours.  Cardiac Enzymes: No results for input(s): "CKTOTAL", "CKMB", "CKMBINDEX", "TROPONINI" in the last 168 hours. BNP: Invalid input(s): "POCBNP" CBG: No results for input(s): "GLUCAP" in the last 168 hours.  Time coordinating discharge: 50 minutes  Signed:  Daryll Drown NP  [facility] 07/08/2023, 2:19 PM

## 2023-06-22 NOTE — Plan of Care (Signed)

## 2023-06-23 ENCOUNTER — Telehealth: Payer: Self-pay | Admitting: *Deleted

## 2023-06-23 ENCOUNTER — Other Ambulatory Visit (HOSPITAL_COMMUNITY): Payer: Self-pay

## 2023-06-23 NOTE — Transitions of Care (Post Inpatient/ED Visit) (Signed)
   06/23/2023  Name: Martin [name] MRN: [id number] DOB: 1990/11/21  Today's TOC FU Call Status: Today's TOC FU Call Status:: Unsuccessful Call (1st Attempt) Unsuccessful Call (1st Attempt) Date: 06/23/23  Attempted to reach the patient regarding the most recent Inpatient/ED visit.  Follow Up Plan: Additional outreach attempts will be made to reach the patient to complete the Transitions of Care (Post Inpatient/ED visit) call.   Irving Shows [facility], BSN RN Care Manager/ Transition of Care [place]/ [facility] [phone]

## 2023-06-24 ENCOUNTER — Telehealth: Payer: Self-pay | Admitting: *Deleted

## 2023-06-24 NOTE — Transitions of Care (Post Inpatient/ED Visit) (Signed)
   06/24/2023  Name: CAMEO SHEWELL MRN: [id number] DOB: 12-22-1990  Today's TOC FU Call Status: Today's TOC FU Call Status:: Unsuccessful Call (2nd Attempt) Unsuccessful Call (2nd Attempt) Date: 06/24/23  Attempted to reach the patient regarding the most recent Inpatient/ED visit.  Follow Up Plan: Additional outreach attempts will be made to reach the patient to complete the Transitions of Care (Post Inpatient/ED visit) call.   Irving Shows [facility], BSN RN Care Manager/ Transition of Care [place]/ [facility] [phone]

## 2023-06-25 ENCOUNTER — Telehealth: Payer: Self-pay | Admitting: *Deleted

## 2023-06-25 LAB — CULTURE, BLOOD (ROUTINE X 2)
Culture: NO GROWTH
Special Requests: ADEQUATE

## 2023-06-25 NOTE — Transitions of Care (Post Inpatient/ED Visit) (Signed)
   06/25/2023  Name: CHANANYA CANIZALEZ MRN: [id number] DOB: 08-24-1990  Today's TOC FU Call Status: Today's TOC FU Call Status:: Unsuccessful Call (3rd Attempt) Unsuccessful Call (3rd Attempt) Date: 06/25/23  Attempted to reach the patient regarding the most recent Inpatient/ED visit.  Follow Up Plan: No further outreach attempts will be made at this time. We have been unable to contact the patient.  Irving Shows [facility], BSN RN Care Manager/ Transition of Care [place]/ [facility] [phone]

## 2023-06-26 LAB — CULTURE, BLOOD (ROUTINE X 2): Culture: NO GROWTH

## 2023-07-01 ENCOUNTER — Other Ambulatory Visit: Payer: Self-pay | Admitting: Family Medicine

## 2023-07-01 DIAGNOSIS — D571 Sickle-cell disease without crisis: Secondary | ICD-10-CM

## 2023-07-01 DIAGNOSIS — F1190 Opioid use, unspecified, uncomplicated: Secondary | ICD-10-CM

## 2023-07-01 NOTE — Telephone Encounter (Signed)
 Copied from CRM [id number]. Topic: Clinical - Medication Refill >> Jul 01, 2023 12:53 PM Izetta Dakin wrote: Most Recent Primary Care Visit:  Provider: Massie Maroon  Department: [facility]  Visit Type: OFFICE VISIT  Date: 01/12/2023  Medication: oxyCODONE-acetaminophen (PERCOCET) 10-325 MG tablet  Has the patient contacted their pharmacy? Yes (Agent: If no, request that the patient contact the pharmacy for the refill. If patient does not wish to contact the pharmacy document the reason why and proceed with request.) (Agent: If yes, when and what did the pharmacy advise?)  Is this the correct pharmacy for this prescription? Yes If no, delete pharmacy and type the correct one.  This is the patient's preferred pharmacy:  [id number] - [place], [place] - [address] AT [facility] [address] [place] [address] Phone: [phone] Fax: [phone]     Has the prescription been filled recently? No  Is the patient out of the medication? Yes  Has the patient been seen for an appointment in the last year OR does the patient have an upcoming appointment? Yes  Can we respond through MyChart? Yes  Agent: Please be advised that Rx refills may take up to 3 business days. We ask that you follow-up with your pharmacy.

## 2023-07-02 ENCOUNTER — Other Ambulatory Visit: Payer: Self-pay | Admitting: Nurse Practitioner

## 2023-07-02 DIAGNOSIS — D571 Sickle-cell disease without crisis: Secondary | ICD-10-CM

## 2023-07-02 DIAGNOSIS — F1190 Opioid use, unspecified, uncomplicated: Secondary | ICD-10-CM

## 2023-07-05 ENCOUNTER — Telehealth: Payer: Self-pay | Admitting: Nurse Practitioner

## 2023-07-05 NOTE — Telephone Encounter (Signed)
 Copied from CRM [id number]. Topic: Clinical - Medication Refill >> Jul 05, 2023 12:39 PM Gildardo Pounds wrote: Most Recent Primary Care Visit:  Provider: Massie Maroon  Department: [facility]  Visit Type: OFFICE VISIT  Date: 01/12/2023  Medication: oxyCODONE-acetaminophen (PERCOCET) 10-325 MG tablet  Has the patient contacted their pharmacy? Yes (Agent: If no, request that the patient contact the pharmacy for the refill. If patient does not wish to contact the pharmacy document the reason why and proceed with request.) (Agent: If yes, when and what did the pharmacy advise?)  Is this the correct pharmacy for this prescription? Yes If no, delete pharmacy and type the correct one.  This is the patient's preferred pharmacy:  [id number] - [place], [place] - [address] AT [facility] [address] [place] [address] Phone: [phone] Fax: [phone]   Has the prescription been filled recently? No  Is the patient out of the medication? Yes  Has the patient been seen for an appointment in the last year OR does the patient have an upcoming appointment? Yes  Can we respond through MyChart? Yes  Agent: Please be advised that Rx refills may take up to 3 business days. We ask that you follow-up with your pharmacy.

## 2023-07-05 NOTE — Telephone Encounter (Signed)
 Pt has appt 08/04/23

## 2023-07-07 ENCOUNTER — Other Ambulatory Visit: Payer: Self-pay | Admitting: Nurse Practitioner

## 2023-07-07 ENCOUNTER — Telehealth: Payer: Self-pay

## 2023-07-07 DIAGNOSIS — F1190 Opioid use, unspecified, uncomplicated: Secondary | ICD-10-CM

## 2023-07-07 DIAGNOSIS — D571 Sickle-cell disease without crisis: Secondary | ICD-10-CM

## 2023-07-07 MED ORDER — OXYCODONE-ACETAMINOPHEN 10-325 MG PO TABS: 1.0000 | ORAL_TABLET | ORAL | 0 refills | Status: AC | PRN

## 2023-07-07 NOTE — Progress Notes (Signed)
 Reviewed PDMP substance reporting system prior to prescribing opiate medications. No inconsistencies noted.    1. Hb-SS disease without crisis (HCC)  - oxyCODONE-acetaminophen (PERCOCET) 10-325 MG tablet; Take 1 tablet by mouth every 4 (four) hours as needed for up to 15 days for pain.  Dispense: 90 tablet; Refill: 0  2. Chronic, continuous use of opioids  - oxyCODONE-acetaminophen (PERCOCET) 10-325 MG tablet; Take 1 tablet by mouth every 4 (four) hours as needed for up to 15 days for pain.  Dispense: 90 tablet; Refill: 0

## 2023-07-07 NOTE — Telephone Encounter (Signed)
 Done [facility]

## 2023-07-12 ENCOUNTER — Ambulatory Visit: Payer: Self-pay | Admitting: Family Medicine

## 2023-07-27 ENCOUNTER — Other Ambulatory Visit: Payer: Self-pay | Admitting: Family Medicine

## 2023-07-27 NOTE — Telephone Encounter (Unsigned)
 Copied from CRM [id number]. Topic: Clinical - Medication Refill >> Jul 27, 2023  1:35 PM Almira Coaster wrote: Most Recent Primary Care Visit:  Provider: Massie Maroon  Department: [facility]  Visit Type: OFFICE VISIT  Date: 01/12/2023  Medication: percocet 10 mg  Has the patient contacted their pharmacy? No (Agent: If no, request that the patient contact the pharmacy for the refill. If patient does not wish to contact the pharmacy document the reason why and proceed with request.) (Agent: If yes, when and what did the pharmacy advise?)  Is this the correct pharmacy for this prescription? Yes If no, delete pharmacy and type the correct one.  This is the patient's preferred pharmacy:   [facility] [address] [place] [address] Phone: [phone] Fax: [phone]   Has the prescription been filled recently? No  Is the patient out of the medication? Yes  Has the patient been seen for an appointment in the last year OR does the patient have an upcoming appointment? Yes  Can we respond through MyChart? Yes  Agent: Please be advised that Rx refills may take up to 3 business days. We ask that you follow-up with your pharmacy.

## 2023-07-27 NOTE — Telephone Encounter (Signed)
 Please advise in [place] absence. Thank you [facility]

## 2023-07-28 ENCOUNTER — Other Ambulatory Visit: Payer: Self-pay | Admitting: Internal Medicine

## 2023-07-28 MED ORDER — OXYCODONE-ACETAMINOPHEN 10-325 MG PO TABS: 1.0000 | ORAL_TABLET | Freq: Three times a day (TID) | ORAL | 0 refills | Status: DC | PRN

## 2023-08-04 ENCOUNTER — Encounter: Payer: Self-pay | Admitting: Nurse Practitioner

## 2023-08-04 ENCOUNTER — Telehealth (INDEPENDENT_AMBULATORY_CARE_PROVIDER_SITE_OTHER): Payer: Self-pay | Admitting: Nurse Practitioner

## 2023-08-04 ENCOUNTER — Other Ambulatory Visit (HOSPITAL_COMMUNITY): Payer: Self-pay

## 2023-08-04 VITALS — Ht 75.0 in | Wt 140.0 lb

## 2023-08-04 DIAGNOSIS — Z86711 Personal history of pulmonary embolism: Secondary | ICD-10-CM

## 2023-08-04 DIAGNOSIS — B20 Human immunodeficiency virus [HIV] disease: Secondary | ICD-10-CM

## 2023-08-04 DIAGNOSIS — D571 Sickle-cell disease without crisis: Secondary | ICD-10-CM

## 2023-08-04 DIAGNOSIS — J9610 Chronic respiratory failure, unspecified whether with hypoxia or hypercapnia: Secondary | ICD-10-CM | POA: Insufficient documentation

## 2023-08-04 DIAGNOSIS — Z21 Asymptomatic human immunodeficiency virus [HIV] infection status: Secondary | ICD-10-CM

## 2023-08-04 DIAGNOSIS — E559 Vitamin D deficiency, unspecified: Secondary | ICD-10-CM

## 2023-08-04 DIAGNOSIS — J9611 Chronic respiratory failure with hypoxia: Secondary | ICD-10-CM

## 2023-08-04 DIAGNOSIS — F1290 Cannabis use, unspecified, uncomplicated: Secondary | ICD-10-CM

## 2023-08-04 DIAGNOSIS — F1190 Opioid use, unspecified, uncomplicated: Secondary | ICD-10-CM

## 2023-08-04 MED ORDER — OXYCODONE-ACETAMINOPHEN 10-325 MG PO TABS
1.0000 | ORAL_TABLET | ORAL | 0 refills | Status: DC | PRN
Start: 2023-08-04 — End: 2023-08-18
  Filled 2023-08-04: qty 90, 15d supply, fill #0

## 2023-08-04 NOTE — Assessment & Plan Note (Signed)
 Lab Results  Component Value Date   FERRITIN 3,082 (H) 01/12/2023  Rechecking labs Has not been taking Ferriprox that was ordered

## 2023-08-04 NOTE — Assessment & Plan Note (Signed)
Need to avoid use of marijuana discussed

## 2023-08-04 NOTE — Progress Notes (Addendum)
 Virtual Visit via Video  Note  I connected with Martin [name] @ on 08/04/23 at 1:50pm by video and verified that I am speaking with the correct person using two identifiers.  I spent 19 minutes on this video encounter  Location: Patient: Home Provider: Office   I discussed the limitations, risks, security and privacy concerns of performing an evaluation and management service by telephone and the availability of in person appointments. I also discussed with the patient that there may be a patient responsible charge related to this service. The patient expressed understanding and agreed to proceed.   History of Present Illness: Martin [name]  has a past medical history of Anxiety, HIV (human immunodeficiency virus infection) (HCC), Proteinuria, 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HCC), and Vitamin D deficiency (10/2018).  Patient presents to establish care for his chronic medical conditions.  Previous PCP Martin Handler NP  Sickle cell disease.  Currently on folic acid 1 mg daily, hydroxyurea 2000 mg daily( states that he has plenty pills at home ).  For chronic pain takes Percocet 10/325mg   1 tablet every 4 hours as needed.  Percocet was last taken today, his sickle cell pain which is mainly in his lower back and hips is currently 4/10.  He currently denies fever, chills, chest pain, shortness of breath, abdominal pain, nausea, vomiting.  He is not established with hematologist.  States that he is up-to-date with yearly eye exam. Percocet was recently refilled but sent to pharmacy in [place], he would like his prescription to be sent to [facility].  The nurse will call the pharmacy in [place] to cancel the prescription that was sent previously.  HIV.  He has lost follow-up with infectious disease in [place] states that he takes [employer] education [employer].  We discussed referral to infectious disease specialist at [facility]  History of pulmonary embolism.  On Xarelto 5 mg twice daily.  Patient denies abnormal  bleeding  Chronic respiratory failure with hypoxia.  States that he is on oxygen 3 L via nasal cannula as needed, he was referred to pulmonologist but did not follow-up with them because of the type of insurance he has  Elevated ferritin.  Has a prescription for Ferriprox but he is not taking the medication    Observations/Objective: Patient alert and oriented no sign of distress noted  Assessment and Plan: Chronic respiratory failure (HCC) Continue supplemental oxygen 3 L via nasal cannula New referral to pulmonology placed  Hb-SS disease without crisis (HCC) Sickle cell disease - Continue Hydrea 2000 mg daily. Folic acid 1 mg daily to prevent aplastic bone marrow crises.  Will check sickle cell panel for absolute neutrophil count and platelets.  We discussed the need for good hydration, monitoring of hydration status, avoidance of heat, cold, stress, and infection triggers.  The patient was reminded of the need to seek medical attention of any symptoms of bleeding, anemia, or infection.   Pulmonary evaluation -has chronic respiratory failure.  Patient referred to pulmonology  Eye - High risk of proliferative retinopathy.  States that he is up-to-date with annual eye exam  Acute and chronic painful episodes -   We discussed that pt is to receive her Schedule II prescriptions only from [place]. Pt is also aware that the prescription history is available to [place] online through the [facility] CSRS. We reminded the patient that all patients receiving Schedule II narcotics must be seen for follow within the 3 months in the office.  We reviewed the terms of our pain agreement, including the need  to keep medicines in a safe locked location away from children or pets, and the need to report excess sedation or constipation, measures to avoid constipation, and policies related to early refills and stolen prescriptions. According to the [place] Chronic Pain Initiative program, we have reviewed details related to analgesia,  adverse effects, aberrant behaviors. Reviewed [place] Substance Reporting system prior to prescribing opiate medication, no inconsistencies noted.   Iron overload from chronic transfusion.  Will check ferritin levels.  Has a prescription for Ferriprox that he does not take   1. Hb-SS disease without crisis (HCC) (Primary)  - oxyCODONE-acetaminophen (PERCOCET) 10-325 MG tablet; Take 1 tablet by mouth every 4 (four) hours as needed for pain.  Dispense: 90 tablet; Refill: 0  2. Chronic, continuous use of opioids  - oxyCODONE-acetaminophen (PERCOCET) 10-325 MG tablet; Take 1 tablet by mouth every 4 (four) hours as needed for pain.  Dispense: 90 tablet; Refill: 0 - Sickle Cell Panel; Future - ToxAssure Flex 15, Ur; Future        History of HIV infection (HCC) Patient referred to infectious disease  History of pulmonary embolus (PE) Continue Eliquis 5 mg twice daily  Iron overload, transfusional Lab Results  Component Value Date   FERRITIN 3,082 (H) 01/12/2023  Rechecking labs Has not been taking Ferriprox that was ordered  Vitamin D deficiency States that he takes vitamin D 5000 units daily Last vitamin D Lab Results  Component Value Date   VD25OH 17.2 (L) 01/12/2023     Marijuana use Need to avoid use of marijuana discussed   Follow Up Instructions:    I discussed the assessment and treatment plan with the patient. The patient was provided an opportunity to ask questions and all were answered. The patient agreed with the plan and demonstrated an understanding of the instructions.   The patient was advised to call back or seek an in-person evaluation if the symptoms worsen or if the condition fails to improve as anticipated.

## 2023-08-04 NOTE — Assessment & Plan Note (Signed)
 States that he takes vitamin D 5000 units daily Last vitamin D Lab Results  Component Value Date   VD25OH 17.2 (L) 01/12/2023

## 2023-08-04 NOTE — Assessment & Plan Note (Signed)
 Sickle cell disease - Continue Hydrea 2000 mg daily. Folic acid 1 mg daily to prevent aplastic bone marrow crises.  Will check sickle cell panel for absolute neutrophil count and platelets.  We discussed the need for good hydration, monitoring of hydration status, avoidance of heat, cold, stress, and infection triggers.  The patient was reminded of the need to seek medical attention of any symptoms of bleeding, anemia, or infection.   Pulmonary evaluation -has chronic respiratory failure.  Patient referred to pulmonology  Eye - High risk of proliferative retinopathy.  States that he is up-to-date with annual eye exam  Acute and chronic painful episodes -   We discussed that pt is to receive her Schedule II prescriptions only from us . Pt is also aware that the prescription history is available to us  online through the [facility] CSRS. We reminded the patient that all patients receiving Schedule II narcotics must be seen for follow within the 3 months in the office.  We reviewed the terms of our pain agreement, including the need to keep medicines in a safe locked location away from children or pets, and the need to report excess sedation or constipation, measures to avoid constipation, and policies related to early refills and stolen prescriptions. According to the [place] Chronic Pain Initiative program, we have reviewed details related to analgesia, adverse effects, aberrant behaviors. Reviewed [place] Substance Reporting system prior to prescribing opiate medication, no inconsistencies noted.   Iron overload from chronic transfusion.  Will check ferritin levels.  Has a prescription for Ferriprox that he does not take   1. Hb-SS disease without crisis (HCC) (Primary)  - oxyCODONE-acetaminophen (PERCOCET) 10-325 MG tablet; Take 1 tablet by mouth every 4 (four) hours as needed for pain.  Dispense: 90 tablet; Refill: 0  2. Chronic, continuous use of opioids  - oxyCODONE-acetaminophen (PERCOCET) 10-325 MG tablet; Take  1 tablet by mouth every 4 (four) hours as needed for pain.  Dispense: 90 tablet; Refill: 0 - Sickle Cell Panel; Future - ToxAssure Flex 15, Ur; Future

## 2023-08-04 NOTE — Assessment & Plan Note (Signed)
 Patient referred to infectious disease

## 2023-08-04 NOTE — Assessment & Plan Note (Signed)
 Continue Eliquis 5 mg twice daily

## 2023-08-04 NOTE — Patient Instructions (Signed)
 1. Hb-SS disease without crisis (HCC) (Primary)  - oxyCODONE-acetaminophen (PERCOCET) 10-325 MG tablet; Take 1 tablet by mouth every 4 (four) hours as needed for pain.  Dispense: 90 tablet; Refill: 0  2. Chronic, continuous use of opioids  - oxyCODONE-acetaminophen (PERCOCET) 10-325 MG tablet; Take 1 tablet by mouth every 4 (four) hours as needed for pain.  Dispense: 90 tablet; Refill: 0 - Sickle Cell Panel; Future - ToxAssure Flex 15, Ur; Future  3. History of pulmonary embolus (PE)   4. Chronic respiratory failure with hypoxia (HCC)  - Ambulatory referral to Pulmonology  5. Iron overload, transfusional   6. History of HIV infection (HCC)  - Ambulatory referral to Infectious Disease  7. Vitamin D deficiency    It is important that you exercise regularly at least 30 minutes 5 times a week as tolerated  Think about what you will eat, plan ahead. Choose " clean, green, fresh or frozen" over canned, processed or packaged foods which are more sugary, salty and fatty. 70 to 75% of food eaten should be vegetables and fruit. Three meals at set times with snacks allowed between meals, but they must be fruit or vegetables. Aim to eat over a 12 hour period , example 7 am to 7 pm, and STOP after  your last meal of the day. Drink water,generally about 64 ounces per day, no other drink is as healthy. Fruit juice is best enjoyed in a healthy way, by EATING the fruit.  Thanks for choosing [facility] we consider it a privelige to serve you.

## 2023-08-04 NOTE — Assessment & Plan Note (Signed)
 Continue supplemental oxygen 3 L via nasal cannula New referral to pulmonology placed

## 2023-08-05 ENCOUNTER — Emergency Department (HOSPITAL_COMMUNITY)
Admission: EM | Admit: 2023-08-05 | Discharge: 2023-08-05 | Disposition: A | Discharge status: Home / Self Care | Payer: Self-pay | Attending: Emergency Medicine | Admitting: Emergency Medicine

## 2023-08-05 ENCOUNTER — Other Ambulatory Visit: Payer: Self-pay

## 2023-08-05 DIAGNOSIS — Z21 Asymptomatic human immunodeficiency virus [HIV] infection status: Secondary | ICD-10-CM | POA: Insufficient documentation

## 2023-08-05 DIAGNOSIS — Z7901 Long term (current) use of anticoagulants: Secondary | ICD-10-CM | POA: Insufficient documentation

## 2023-08-05 DIAGNOSIS — D5700 Hb-SS disease with crisis, unspecified: Secondary | ICD-10-CM | POA: Insufficient documentation

## 2023-08-05 LAB — COMPREHENSIVE METABOLIC PANEL WITH GFR
ALT: 16 U/L (ref 0–44)
AST: 22 U/L (ref 15–41)
Albumin: 3.5 g/dL (ref 3.5–5.0)
Alkaline Phosphatase: 50 U/L (ref 38–126)
Anion gap: 8 (ref 5–15)
BUN: 8 mg/dL (ref 6–20)
CO2: 21 mmol/L — ABNORMAL LOW (ref 22–32)
Calcium: 7.9 mg/dL — ABNORMAL LOW (ref 8.9–10.3)
Chloride: 111 mmol/L (ref 98–111)
Creatinine, Ser: 0.76 mg/dL (ref 0.61–1.24)
GFR, Estimated: >60 mL/min (ref >60)
Glucose, Bld: 78 mg/dL (ref 70–99)
Potassium: 3.2 mmol/L — ABNORMAL LOW (ref 3.5–5.1)
Sodium: 140 mmol/L (ref 135–145)
Total Bilirubin: 4.4 mg/dL — ABNORMAL HIGH (ref 0.0–1.2)
Total Protein: 7 g/dL (ref 6.5–8.1)

## 2023-08-05 LAB — CBC WITH DIFFERENTIAL/PLATELET
Abs Immature Granulocytes: 0.05 10*3/uL (ref 0.00–0.07)
Basophils Absolute: 0 10*3/uL (ref 0.0–0.1)
Basophils Relative: 0 %
Eosinophils Absolute: 0.1 10*3/uL (ref 0.0–0.5)
Eosinophils Relative: 2 %
HCT: 16.3 % — ABNORMAL LOW (ref 39.0–52.0)
Hemoglobin: 8 g/dL — ABNORMAL LOW (ref 13.0–17.0)
Immature Granulocytes: 1 %
Lymphocytes Relative: 46 %
Lymphs Abs: 4.5 10*3/uL — ABNORMAL HIGH (ref 0.7–4.0)
MCH: 49.4 pg — ABNORMAL HIGH (ref 26.0–34.0)
MCHC: 49.1 g/dL — ABNORMAL HIGH (ref 30.0–36.0)
MCV: 100.6 fL — ABNORMAL HIGH (ref 80.0–100.0)
Monocytes Absolute: 1.3 10*3/uL — ABNORMAL HIGH (ref 0.1–1.0)
Monocytes Relative: 13 %
Neutro Abs: 3.6 10*3/uL (ref 1.7–7.7)
Neutrophils Relative %: 38 %
Platelets: 216 10*3/uL (ref 150–400)
RBC: 1.62 MIL/uL — ABNORMAL LOW (ref 4.22–5.81)
RDW: 38.5 % — ABNORMAL HIGH (ref 11.5–15.5)
WBC: 9.6 10*3/uL (ref 4.0–10.5)
nRBC: 25.7 % — ABNORMAL HIGH (ref 0.0–0.2)

## 2023-08-05 LAB — RETICULOCYTES
Immature Retic Fract: 27.1 % — ABNORMAL HIGH (ref 2.3–15.9)
Immature Retic Fract: 22.7 % — ABNORMAL HIGH (ref 2.3–15.9)
RBC.: 1.7 MIL/uL — ABNORMAL LOW (ref 4.22–5.81)
RBC.: 2.32 MIL/uL — ABNORMAL LOW (ref 4.22–5.81)
Retic Count, Absolute: 546.4 10*3/uL — ABNORMAL HIGH (ref 19.0–186.0)
Retic Ct Pct: 23.6 % — ABNORMAL HIGH (ref 0.4–3.1)

## 2023-08-05 MED ORDER — SODIUM CHLORIDE 0.45 % IV SOLN: INTRAVENOUS | Status: DC

## 2023-08-05 MED ORDER — HYDROMORPHONE HCL 1 MG/ML IJ SOLN
2.0000 mg | INTRAMUSCULAR | Status: DC
  Filled 2023-08-05: qty 2

## 2023-08-05 MED ORDER — HYDROMORPHONE HCL 1 MG/ML IJ SOLN
2.0000 mg | INTRAMUSCULAR | Status: AC
  Administered 2023-08-05: 2 mg via INTRAVENOUS
  Filled 2023-08-05: qty 2

## 2023-08-05 MED ORDER — HYDROMORPHONE HCL 1 MG/ML IJ SOLN
2.0000 mg | INTRAMUSCULAR | Status: AC
  Administered 2023-08-05: 2 mg via INTRAVENOUS

## 2023-08-05 MED ORDER — HYDROMORPHONE HCL 1 MG/ML IJ SOLN
INTRAMUSCULAR | Status: AC
  Filled 2023-08-05: qty 1

## 2023-08-05 MED ORDER — SODIUM CHLORIDE 0.9 % IV SOLN
12.5000 mg | Freq: Once | INTRAVENOUS | Status: AC
  Administered 2023-08-05: 12.5 mg via INTRAVENOUS
  Filled 2023-08-05: qty 0.25

## 2023-08-05 MED ORDER — ONDANSETRON HCL 4 MG/2ML IJ SOLN: 4.0000 mg | INTRAMUSCULAR | Status: DC | PRN

## 2023-08-05 MED ORDER — HYDROMORPHONE HCL 1 MG/ML IJ SOLN
2.0000 mg | Freq: Once | INTRAMUSCULAR | Status: AC
  Administered 2023-08-05: 2 mg via INTRAVENOUS
  Filled 2023-08-05: qty 2

## 2023-08-05 NOTE — ED Provider Notes (Signed)
 [place] EMERGENCY DEPARTMENT AT [facility] Provider Note   CSN: [id number] Arrival date & time: 08/05/23  4098     History  Chief Complaint  Patient presents with   Sickle Cell Pain Crisis    Martin [name] is a 33 y.o. male, history of HIV, sickle cell anemia, chronic respiratory failure, on 3 L of O2 at baseline, who presents to the ED secondary to pain all over.  He says most of his pain is in his low back, in his legs, but it feels very similar to his sickle cell pain crisis that he has had before.  He has been compliant with his HIV medication, Eliquis , denies any chest pain, shortness of breath, leg swelling, fevers, or chills.  States that he has been taking his oxycodone  as prescribed, and still has not been able to get the pain under control.   Home Medications Prior to Admission medications   Medication Sig Start Date End Date Taking? Authorizing Provider  abacavir  (ZIAGEN ) 300 MG tablet Take 600 mg by mouth at bedtime. Patient not taking: Reported on 08/04/2023 12/30/22   [provider]  apixaban  (ELIQUIS ) 5 MG TABS tablet Take 1 tablet (5 mg total) by mouth 2 (two) times daily. 12/02/21   Sigurd Driver, [name]  cyclobenzaprine  (FLEXERIL ) 10 MG tablet Take 10 mg by mouth 2 (two) times daily as needed for muscle spasms. 12/22/22   [provider]  Deferiprone  (FERRIPROX ) 500 MG TABS Take 1 tablet by mouth daily. Patient not taking: Reported on 08/04/2023 10/19/22   Sigurd Driver, [name]  ergocalciferol  (VITAMIN D2) 1.25 MG (50000 UT) capsule Take 1 capsule (50,000 Units total) by mouth once a week. Patient taking differently: Take 50,000 Units by mouth once a week. Tues 10/14/22   Sigurd Driver, [name]  folic acid  (FOLVITE ) 1 MG tablet Take 1 mg by mouth daily. 10/10/21   [provider]  gabapentin  (NEURONTIN ) 300 MG capsule Take 300 mg by mouth at bedtime as needed (nerve pain).    [provider]  hydroxyurea  (HYDREA ) 500  MG capsule Take 4 capsules (2,000 mg total) by mouth at bedtime. 05/27/23   Jegede, Olugbemiga E, MD  NARCAN  4 MG/0.1ML LIQD nasal spray kit Place 1 spray into the nose once. Patient not taking: Reported on 08/04/2023 01/07/23   [provider]  oxyCODONE -acetaminophen  (PERCOCET) 10-325 MG tablet Take 1 tablet by mouth every 4 (four) hours as needed for pain. 08/04/23   [name], [name], [name]  pantoprazole  (PROTONIX ) 40 MG tablet Take 40 mg by mouth daily as needed (acid reflux). 12/25/22   [provider]  TIVICAY  50 MG tablet Take 50 mg by mouth at bedtime. Patient not taking: Reported on 08/04/2023 12/30/22   [provider]  triamcinolone  cream (KENALOG ) 0.1 % Apply 1 Application topically 2 (two) times daily. Patient not taking: Reported on 08/04/2023 01/12/23   Sigurd Driver, [name]      Allergies    Ketorolac  tromethamine , Tape, and Wound dressing adhesive    Review of Systems   Review of Systems  Respiratory:  Negative for shortness of breath.   Cardiovascular:  Negative for chest pain.    Physical Exam Updated Vital Signs BP 111/72 (BP Location: Left Arm)   Pulse 80   Temp 97.6 F (36.4 C)   Resp (!) 23   SpO2 94%  Physical Exam Vitals and nursing note reviewed.  Constitutional:      General: He is not  in acute distress.    Appearance: He is well-developed.  HENT:     Head: Normocephalic and atraumatic.  Eyes:     Conjunctiva/sclera: Conjunctivae normal.  Cardiovascular:     Rate and Rhythm: Normal rate and regular rhythm.     Heart sounds: No murmur heard. Pulmonary:     Effort: Pulmonary effort is normal. No respiratory distress.     Breath sounds: Normal breath sounds.     Comments: 3 L of O2 which is baseline for patient Abdominal:     Palpations: Abdomen is soft.     Tenderness: There is no abdominal tenderness.  Musculoskeletal:        General: No swelling.     Cervical back: Neck supple.     Comments: No edema bilateral lower  extremities  Skin:    General: Skin is warm and dry.     Capillary Refill: Capillary refill takes less than 2 seconds.  Neurological:     Mental Status: He is alert.  Psychiatric:        Mood and Affect: Mood normal.     ED Results / Procedures / Treatments   Labs (all labs ordered are listed, but only abnormal results are displayed) Labs Reviewed  COMPREHENSIVE METABOLIC PANEL WITH GFR - Abnormal; Notable for the following components:      Result Value   Potassium 3.2 (*)    CO2 21 (*)    Calcium  7.9 (*)    Total Bilirubin 4.4 (*)    All other components within normal limits  CBC WITH DIFFERENTIAL/PLATELET - Abnormal; Notable for the following components:   RBC 1.62 (*)    Hemoglobin 8.0 (*)    HCT 16.3 (*)    MCV 100.6 (*)    MCH 49.4 (*)    MCHC 49.1 (*)    RDW 38.5 (*)    nRBC 25.7 (*)    Lymphs Abs 4.5 (*)    Monocytes Absolute 1.3 (*)    All other components within normal limits  RETICULOCYTES - Abnormal; Notable for the following components:   RBC. 1.70 (*)    Immature Retic Fract 27.1 (*)    All other components within normal limits  RETICULOCYTES    EKG None  Radiology No results found.  Procedures Procedures    Medications Ordered in ED Medications  0.45 % sodium chloride  infusion ( Intravenous New Bag/Given 08/05/23 0256)  ondansetron  (ZOFRAN ) injection 4 mg (has no administration in time range)  HYDROmorphone  (DILAUDID ) injection 2 mg (has no administration in time range)  diphenhydrAMINE  (BENADRYL ) 12.5 mg in sodium chloride  0.9 % 50 mL IVPB (12.5 mg Intravenous New Bag/Given 08/05/23 0247)  HYDROmorphone  (DILAUDID ) injection 2 mg (2 mg Intravenous Given 08/05/23 0244)  HYDROmorphone  (DILAUDID ) injection 2 mg (2 mg Intravenous Given 08/05/23 0313)    ED Course/ Medical Decision Making/ A&P                                 Medical Decision Making Patient is a 33 year old male, here for sickle cell pain crisis, he states that it feels very  similar to his past sickle cell pain crisis, states is all over his low back, and legs.  Denies any IV drug use, has been compliant with his HIV medication, states his CD4 Have been good.  He has not had any swelling of his legs.  Has been taking his oxycodone  as prescribed, but not getting  his pain under control.  He is overall well-appearing comfortable in the room.  Denies any chest pain, fevers or chills.  We will obtain reticulocytes, blood work, including CBC, BMP for further evaluation.  Amount and/or Complexity of Data Reviewed Labs: ordered.    Details: Blood work fairly stable, except reticulocytes that are not calculable Discussion of management or test interpretation with external provider(s): Discussed with patient, his reticulocyte, count I have called 4 times about, lab specialist that "I do not know what I am doing" afterwards, the reticulocyte count was incalculable, and lab tech said she may have added too much gluten to it.  I discussed this with the patient, who states he would like to go home, because his pain is well-controlled, we will drawl another sample for him and he should follow-up with sickle cell clinic.  Risk Prescription drug management.    Final Clinical Impression(s) / ED Diagnoses Final diagnoses:  Sickle cell pain crisis [facility])    Rx / DC Orders ED Discharge Orders     None         Clyde Zarrella, Dwaine Gip, PA 08/05/23 0525    Lindle Rhea, MD 08/06/23 1818

## 2023-08-05 NOTE — ED Triage Notes (Signed)
 Pt reports lower back pain and bilateral leg pain that reoccurred earlier today. Pain similar to his SCC in the past. Pt arrives on room air 86% SpO2. Wears 3 L at home oxygen - SpO2 improve 94% on baseline oxygen.

## 2023-08-05 NOTE — Discharge Instructions (Addendum)
 Please follow-up with your primary care doctor, and sickle cell center.  Glad you are feeling much better, return if you have severe shortness of breath or chest pain.

## 2023-08-05 NOTE — ED Notes (Signed)
 No Power port kits in stock, contacted materials.

## 2023-08-09 ENCOUNTER — Non-Acute Institutional Stay (HOSPITAL_COMMUNITY)
Admission: AD | Admit: 2023-08-09 | Discharge: 2023-08-09 | Disposition: A | Discharge status: Home / Self Care | Payer: Self-pay | Source: Ambulatory Visit | Attending: Internal Medicine | Admitting: Internal Medicine

## 2023-08-09 ENCOUNTER — Telehealth (HOSPITAL_COMMUNITY): Payer: Self-pay

## 2023-08-09 DIAGNOSIS — F1120 Opioid dependence, uncomplicated: Secondary | ICD-10-CM | POA: Insufficient documentation

## 2023-08-09 DIAGNOSIS — Z86711 Personal history of pulmonary embolism: Secondary | ICD-10-CM | POA: Insufficient documentation

## 2023-08-09 DIAGNOSIS — Z7901 Long term (current) use of anticoagulants: Secondary | ICD-10-CM | POA: Insufficient documentation

## 2023-08-09 DIAGNOSIS — D5700 Hb-SS disease with crisis, unspecified: Secondary | ICD-10-CM | POA: Diagnosis present

## 2023-08-09 DIAGNOSIS — Z21 Asymptomatic human immunodeficiency virus [HIV] infection status: Secondary | ICD-10-CM | POA: Insufficient documentation

## 2023-08-09 DIAGNOSIS — G894 Chronic pain syndrome: Secondary | ICD-10-CM | POA: Insufficient documentation

## 2023-08-09 LAB — CBC WITH DIFFERENTIAL/PLATELET
Abs Immature Granulocytes: 0.05 10*3/uL (ref 0.00–0.07)
Basophils Absolute: 0 10*3/uL (ref 0.0–0.1)
Basophils Relative: 0 %
Eosinophils Absolute: 0.1 10*3/uL (ref 0.0–0.5)
Eosinophils Relative: 2 %
HCT: 23.8 % — ABNORMAL LOW (ref 39.0–52.0)
Hemoglobin: 8.2 g/dL — ABNORMAL LOW (ref 13.0–17.0)
Immature Granulocytes: 1 %
Lymphocytes Relative: 39 %
Lymphs Abs: 3.5 10*3/uL (ref 0.7–4.0)
MCH: 35.2 pg — ABNORMAL HIGH (ref 26.0–34.0)
MCHC: 34.5 g/dL (ref 30.0–36.0)
MCV: 102.1 fL — ABNORMAL HIGH (ref 80.0–100.0)
Monocytes Absolute: 1 10*3/uL (ref 0.1–1.0)
Monocytes Relative: 12 %
Neutro Abs: 4.2 10*3/uL (ref 1.7–7.7)
Neutrophils Relative %: 46 %
Platelets: 220 10*3/uL (ref 150–400)
RBC: 2.33 MIL/uL — ABNORMAL LOW (ref 4.22–5.81)
RDW: 33.7 % — ABNORMAL HIGH (ref 11.5–15.5)
WBC: 8.9 10*3/uL (ref 4.0–10.5)
nRBC: 20.9 % — ABNORMAL HIGH (ref 0.0–0.2)

## 2023-08-09 LAB — COMPREHENSIVE METABOLIC PANEL WITH GFR
ALT: 15 U/L (ref 0–44)
AST: 23 U/L (ref 15–41)
Albumin: 3.3 g/dL — ABNORMAL LOW (ref 3.5–5.0)
Alkaline Phosphatase: 51 U/L (ref 38–126)
Anion gap: 6 (ref 5–15)
BUN: 8 mg/dL (ref 6–20)
CO2: 21 mmol/L — ABNORMAL LOW (ref 22–32)
Calcium: 7.9 mg/dL — ABNORMAL LOW (ref 8.9–10.3)
Chloride: 116 mmol/L — ABNORMAL HIGH (ref 98–111)
Creatinine, Ser: 0.51 mg/dL — ABNORMAL LOW (ref 0.61–1.24)
GFR, Estimated: >60 mL/min (ref >60)
Glucose, Bld: 85 mg/dL (ref 70–99)
Potassium: 3.4 mmol/L — ABNORMAL LOW (ref 3.5–5.1)
Sodium: 143 mmol/L (ref 135–145)
Total Bilirubin: 7.6 mg/dL — ABNORMAL HIGH (ref 0.0–1.2)
Total Protein: 6.6 g/dL (ref 6.5–8.1)

## 2023-08-09 LAB — LACTATE DEHYDROGENASE: LDH: 350 U/L — ABNORMAL HIGH (ref 98–192)

## 2023-08-09 LAB — RETICULOCYTES
Immature Retic Fract: 28.3 % — ABNORMAL HIGH (ref 2.3–15.9)
RBC.: 1.83 MIL/uL — ABNORMAL LOW (ref 4.22–5.81)
Retic Count, Absolute: 414.9 10*3/uL — ABNORMAL HIGH (ref 19.0–186.0)
Retic Ct Pct: 22.7 % — ABNORMAL HIGH (ref 0.4–3.1)

## 2023-08-09 MED ORDER — SODIUM CHLORIDE 0.45 % IV SOLN: INTRAVENOUS | Status: DC

## 2023-08-09 MED ORDER — POLYETHYLENE GLYCOL 3350 17 G PO PACK: 17.0000 g | PACK | Freq: Every day | ORAL | Status: DC | PRN

## 2023-08-09 MED ORDER — ACETAMINOPHEN 500 MG PO TABS: 1000.0000 mg | ORAL_TABLET | Freq: Once | ORAL | Status: DC

## 2023-08-09 MED ORDER — HYDROMORPHONE 1 MG/ML IV SOLN
INTRAVENOUS | Status: DC
  Administered 2023-08-09: 30 mg via INTRAVENOUS
  Administered 2023-08-09: 13.5 mg via INTRAVENOUS
  Filled 2023-08-09: qty 30

## 2023-08-09 MED ORDER — DIPHENHYDRAMINE HCL 25 MG PO CAPS: 25.0000 mg | ORAL_CAPSULE | ORAL | Status: DC | PRN

## 2023-08-09 MED ORDER — HEPARIN SOD (PORK) LOCK FLUSH 100 UNIT/ML IV SOLN
500.0000 [IU] | INTRAVENOUS | Status: AC | PRN
  Administered 2023-08-09: 500 [IU]
  Filled 2023-08-09: qty 5

## 2023-08-09 MED ORDER — NALOXONE HCL 0.4 MG/ML IJ SOLN: 0.4000 mg | INTRAMUSCULAR | Status: DC | PRN

## 2023-08-09 MED ORDER — SENNOSIDES-DOCUSATE SODIUM 8.6-50 MG PO TABS: 1.0000 | ORAL_TABLET | Freq: Two times a day (BID) | ORAL | Status: DC

## 2023-08-09 MED ORDER — SODIUM CHLORIDE 0.9% FLUSH: 9.0000 mL | INTRAVENOUS | Status: DC | PRN

## 2023-08-09 MED ORDER — ONDANSETRON HCL 4 MG/2ML IJ SOLN: 4.0000 mg | Freq: Four times a day (QID) | INTRAMUSCULAR | Status: DC | PRN

## 2023-08-09 MED ORDER — SODIUM CHLORIDE 0.9% FLUSH
10.0000 mL | INTRAVENOUS | Status: AC | PRN
  Administered 2023-08-09: 10 mL

## 2023-08-09 NOTE — Progress Notes (Signed)
 Pt is admitted to the day hospital today for sickle cell pain treatment. Pt reports 9/10 pain to bilateral hips and legs. Pt received Dilaudid  PCA, and hydrated with IV Fluids via PAC. At discharge, pt rates pain 6/10. PAC de-accessed and flushed with 0.9% Sodium Chloride  and Heparin , site is covered with a band-aid. AVS offered, but pt declined. Pt is alert, oriented, and ambulatory at discharge.

## 2023-08-09 NOTE — Discharge Summary (Signed)
 Physician Discharge Summary  MOODY ROBBEN ZOX:[id number] DOB: 12-19-1990 DOA: 08/09/2023  PCP: [name], [name], [name]  Admit date: 08/09/2023  Discharge date: 08/09/2023  Time spent: 30 minutes  Discharge Diagnoses:  Active Problems:   Sickle cell anemia with pain [facility])   Discharge Condition: Stable  Diet recommendation: Regular  History of present illness:    Hospital Course:  Alvan X Mohler was admitted to the day hospital with sickle cell painful crisis. Patient was treated with IV fluid, weight based IV Dilaudid  PCA, clinician assisted doses as deemed appropriate, and other adjunct therapies per sickle cell pain management protocol. [place]and showed improvement symptomatically, pain improved from 9/10 to 6/10 at the time of discharge. Patient was discharged home in a hemodynamically stable condition. Peyton will follow-up at the clinic as previously scheduled, continue with home medications as per prior to admission.  Discharge Instructions We discussed the need for good hydration, monitoring of hydration status, avoidance of heat, cold, stress, and infection triggers. We discussed the need to be compliant with taking Hydrea  and other home medications. Elishah was reminded of the need to seek medical attention immediately if any symptom of bleeding, anemia, or infection occurs.  Discharge Exam: Vitals:   08/09/23 1233 08/09/23 1454  BP: 109/71 107/65  Pulse: 79 76  Resp: 14   Temp:  98.2 F (36.8 C)  SpO2: 93% 93%    General appearance: alert, cooperative and no distress Eyes: conjunctivae/corneas clear. PERRL, EOM's intact. Fundi benign. Neck: no adenopathy, no carotid bruit, no JVD, supple, symmetrical, trachea midline and thyroid not enlarged, symmetric, no tenderness/mass/nodules Back: symmetric, no curvature. ROM normal. No CVA tenderness. Resp: clear to auscultation bilaterally Chest wall: no tenderness Cardio: regular rate and rhythm, S1, S2  normal, no murmur, click, rub or gallop GI: soft, non-tender; bowel sounds normal; no masses,  no organomegaly Extremities: extremities normal, atraumatic, no cyanosis or edema Pulses: 2+ and symmetric Skin: Skin color, texture, turgor normal. No rashes or lesions Neurologic: Grossly normal  Discharge Instructions     Call MD for:  severe uncontrolled pain   Complete by: As directed    Diet general   Complete by: As directed    Increase activity slowly   Complete by: As directed       Allergies as of 08/09/2023       Reactions   Ketorolac  Tromethamine  Swelling, Other (See Comments)   Patient reports facial edema and left arm edema after administration.   Tape Rash, Other (See Comments)   PLEASE DO NOT USE THE CLEAR, THICK, "PLASTIC" TAPE- only paper tape is tolerated    Wound Dressing Adhesive Rash        Medication List     TAKE these medications    abacavir  300 MG tablet Commonly known as: ZIAGEN  Take 600 mg by mouth at bedtime.   cyclobenzaprine  10 MG tablet Commonly known as: FLEXERIL  Take 10 mg by mouth 2 (two) times daily as needed for muscle spasms.   Deferiprone  500 MG Tabs Commonly known as: Ferriprox  Take 1 tablet by mouth daily.   Eliquis  5 MG Tabs tablet Generic drug: apixaban  Take 1 tablet (5 mg total) by mouth 2 (two) times daily.   ergocalciferol  1.25 MG (50000 UT) capsule Commonly known as: VITAMIN D2 Take 1 capsule (50,000 Units total) by mouth once a week. What changed: additional instructions   folic acid  1 MG tablet Commonly known as: FOLVITE  Take 1 mg by mouth daily.   gabapentin  300 MG capsule  Commonly known as: NEURONTIN  Take 300 mg by mouth at bedtime as needed (nerve pain).   hydroxyurea  500 MG capsule Commonly known as: HYDREA  Take 4 capsules (2,000 mg total) by mouth at bedtime.   Narcan  4 MG/0.1ML Liqd nasal spray kit Generic drug: naloxone  Place 1 spray into the nose once.   oxyCODONE -acetaminophen  10-325 MG  tablet Commonly known as: PERCOCET Take 1 tablet by mouth every 4 (four) hours as needed for pain.   pantoprazole  40 MG tablet Commonly known as: PROTONIX  Take 40 mg by mouth daily as needed (acid reflux).   Tivicay  50 MG tablet Generic drug: dolutegravir  Take 50 mg by mouth at bedtime.   triamcinolone  cream 0.1 % Commonly known as: KENALOG  Apply 1 Application topically 2 (two) times daily.       Allergies  Allergen Reactions   Ketorolac  Tromethamine  Swelling and Other (See Comments)    Patient reports facial edema and left arm edema after administration.   Tape Rash and Other (See Comments)    PLEASE DO NOT USE THE CLEAR, THICK, "PLASTIC" TAPE- only paper tape is tolerated    Wound Dressing Adhesive Rash     Significant Diagnostic Studies: No results found.  Signed:  Lorel Roes NP  08/09/2023, 3:41 PM

## 2023-08-09 NOTE — Telephone Encounter (Signed)
 Pt called the day hospital wanting to come in today for sickle cell pain treatment. Pt reports 9/10 pain to his lower back and hips. Pt reports taking Oxycodone  10 mg at 6 AM. Pt denies being to the ER recently. Pt screened for COVID and denies symptoms and exposures. Pt denies fever, chest pain, N/V/D, abdominal pain, and priapism. Onyeje, NP notified and per provider pt can come to the day hospital today. Pt notified and verbalized understanding. Per pt he states that he has transportation without driving himself today.

## 2023-08-09 NOTE — H&P (Signed)
 [facility] History and Physical  Jakobee HARVEY MATLACK ZOX:[id number] DOB: 02/20/1991 DOA: 08/09/2023  PCP: [name], [name], [name]   Chief Complaint: No chief complaint on file.   HPI: Panagiotis X Forstrom is a 33 y.o. male with history of sickle cell disease, Jermiah Bodkin is a 33 year old-male with a medical history significant for sickle cell disease, chronic pain syndrome, opiate dependency and tolerance, history of HIV, and history of PE on Xarelto.  Patient presents to the day hospital with generalize body pain and lower extremity pain. Denies cough, fever, chest pain, diarrhea /vomiting. He has had no sick contacts.   Systemic Review: General: The patient denies anorexia, fever, weight loss Cardiac: Denies chest pain, syncope, palpitations, pedal edema  Respiratory: Denies cough, shortness of breath, wheezing GI: Denies severe indigestion/heartburn, abdominal pain, nausea, vomiting, diarrhea and constipation GU: Denies hematuria, incontinence, dysuria  Musculoskeletal: Generalize body, lower extremity pain Skin: Denies suspicious skin lesions Neurologic: Denies focal weakness or numbness, change in vision  Past Medical History:  Diagnosis Date   Anxiety    HIV (human immunodeficiency virus infection) (HCC)    Proteinuria    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HCC)    Vitamin D  deficiency 10/2018    Past Surgical History:  Procedure Laterality Date   IR IMAGING GUIDED PORT INSERTION  08/29/2019   IR REMOVAL TUN ACCESS W/ PORT W/O FL MOD SED  08/30/2020   TEE WITHOUT CARDIOVERSION N/A 08/30/2020   Procedure: TRANSESOPHAGEAL ECHOCARDIOGRAM (TEE);  Surgeon: Hazle Lites, MD;  Location: [facility] ENDOSCOPY;  Service: Cardiovascular;  Laterality: N/A;    Allergies  Allergen Reactions   Ketorolac  Tromethamine  Swelling and Other (See Comments)    Patient reports facial edema and left arm edema after administration.   Tape Rash and Other (See Comments)    PLEASE DO NOT USE THE  CLEAR, THICK, "PLASTIC" TAPE- only paper tape is tolerated    Wound Dressing Adhesive Rash    Family History  Problem Relation Age of Onset   Sickle cell trait Mother    Sickle cell trait Father    Birth defects Maternal Grandmother    Birth defects Paternal Grandmother       Prior to Admission medications   Medication Sig Start Date End Date Taking? Authorizing Provider  abacavir  (ZIAGEN ) 300 MG tablet Take 600 mg by mouth at bedtime. 12/30/22  Yes [provider]  apixaban  (ELIQUIS ) 5 MG TABS tablet Take 1 tablet (5 mg total) by mouth 2 (two) times daily. 12/02/21  Yes Sigurd Driver, [name]  cyclobenzaprine  (FLEXERIL ) 10 MG tablet Take 10 mg by mouth 2 (two) times daily as needed for muscle spasms. 12/22/22  Yes [provider]  ergocalciferol  (VITAMIN D2) 1.25 MG (50000 UT) capsule Take 1 capsule (50,000 Units total) by mouth once a week. Patient taking differently: Take 50,000 Units by mouth once a week. Tues 10/14/22  Yes Sigurd Driver, [name]  folic acid  (FOLVITE ) 1 MG tablet Take 1 mg by mouth daily. 10/10/21  Yes [provider]  gabapentin  (NEURONTIN ) 300 MG capsule Take 300 mg by mouth at bedtime as needed (nerve pain).   Yes [provider]  hydroxyurea  (HYDREA ) 500 MG capsule Take 4 capsules (2,000 mg total) by mouth at bedtime. 05/27/23  Yes Jegede, Olugbemiga E, MD  oxyCODONE -acetaminophen  (PERCOCET) 10-325 MG tablet Take 1 tablet by mouth every 4 (four) hours as needed for pain. 08/04/23  Yes [name], [name], [name]  pantoprazole  (PROTONIX ) 40 MG tablet Take 40  mg by mouth daily as needed (acid reflux). 12/25/22  Yes [provider]  TIVICAY  50 MG tablet Take 50 mg by mouth at bedtime. 12/30/22  Yes [provider]  triamcinolone  cream (KENALOG ) 0.1 % Apply 1 Application topically 2 (two) times daily. 01/12/23  Yes Sigurd Driver, [name]  Deferiprone  (FERRIPROX ) 500 MG TABS Take 1 tablet by mouth daily. Patient not taking:  Reported on 08/04/2023 10/19/22   Sigurd Driver, [name]  NARCAN  4 MG/0.1ML LIQD nasal spray kit Place 1 spray into the nose once. Patient not taking: Reported on 08/04/2023 01/07/23   [provider]     Physical Exam: Vitals:   08/09/23 1040 08/09/23 1042 08/09/23 1233  BP: 116/70  109/71  Pulse: 72  79  Resp: 20 20 14   Temp: 98.2 F (36.8 C)    TempSrc: Temporal    SpO2: 94% 96% 93%    General: Alert, awake, afebrile, anicteric, not in obvious distress HEENT: Normocephalic and Atraumatic, Mucous membranes pink                PERRLA; EOM intact; No scleral icterus,                 Nares: Patent, Oropharynx: Clear, Fair Dentition                 Neck: FROM, no cervical lymphadenopathy, thyromegaly, carotid bruit or JVD;  CHEST WALL: No tenderness  CHEST: Normal respiration, clear to auscultation bilaterally  HEART: Regular rate and rhythm; no murmurs rubs or gallops  BACK: No kyphosis or scoliosis; no CVA tenderness  ABDOMEN: Positive Bowel Sounds, soft, non-tender; no masses, no organomegaly EXTREMITIES: No cyanosis, clubbing, or edema SKIN:  no rash or ulceration  CNS: Alert and Oriented x 4, Nonfocal exam, CN 2-12 intact  Labs on Admission:  Basic Metabolic Panel: Recent Labs  Lab 08/05/23 0250 08/09/23 1035  NA 140 143  K 3.2* 3.4*  CL 111 116*  CO2 21* 21*  GLUCOSE 78 85  BUN 8 8  CREATININE 0.76 0.51*  CALCIUM  7.9* 7.9*   Liver Function Tests: Recent Labs  Lab 08/05/23 0250 08/09/23 1035  AST 22 23  ALT 16 15  ALKPHOS 50 51  BILITOT 4.4* 7.6*  PROT 7.0 6.6  ALBUMIN 3.5 3.3*   No results for input(s): "LIPASE", "AMYLASE" in the last 168 hours. No results for input(s): "AMMONIA" in the last 168 hours. CBC: Recent Labs  Lab 08/05/23 0250 08/09/23 1035  WBC 9.6 8.9  NEUTROABS 3.6 4.2  HGB 8.0* 8.2*  HCT 16.3* 23.8*  MCV 100.6* 102.1*  PLT 216 220   Cardiac Enzymes: No results for input(s): "CKTOTAL", "CKMB", "CKMBINDEX", "TROPONINI" in  the last 168 hours.  BNP (last 3 results) No results for input(s): "BNP" in the last 8760 hours.  ProBNP (last 3 results) No results for input(s): "PROBNP" in the last 8760 hours.  CBG: No results for input(s): "GLUCAP" in the last 168 hours.   Assessment/Plan Active Problems:   Sickle cell anemia with pain (HCC)  Admits to the Day Hospital for extended observation IVF D5 .45% Saline @ 125 mls/hour Weight based Dilaudid  PCA started within 30 minutes of admission IV Toradol  15 mg X 1 doses Acetaminophen  1000 mg x 1 dose Labs: CBCD, CMP, Retic Count and LDH Monitor vitals very closely, Re-evaluate pain scale every hour 2 L of Oxygen  by [place] Patient will be re-evaluated for pain in the context of function and relationship to baseline  as care progresses. If no significant relieve from pain (remains above 5/10) will transfer patient to inpatient services for further evaluation and management  Code Status: Full  Family Communication: None  DVT Prophylaxis: Ambulate as tolerated   Time spent: 35 Minutes  Lorel Roes NP  If 7PM-7AM, please contact night-coverage [email or web address] 08/09/2023, 2:09 PM

## 2023-08-10 ENCOUNTER — Inpatient Hospital Stay (HOSPITAL_COMMUNITY): Payer: Self-pay

## 2023-08-10 ENCOUNTER — Encounter (HOSPITAL_COMMUNITY): Payer: Self-pay | Admitting: Internal Medicine

## 2023-08-10 ENCOUNTER — Inpatient Hospital Stay (HOSPITAL_COMMUNITY)
Admission: EM | Admit: 2023-08-10 | Discharge: 2023-08-13 | DRG: 811 | Disposition: A | Discharge status: Home / Self Care | Payer: Self-pay | Attending: Internal Medicine | Admitting: Internal Medicine

## 2023-08-10 ENCOUNTER — Other Ambulatory Visit: Payer: Self-pay

## 2023-08-10 DIAGNOSIS — G8929 Other chronic pain: Secondary | ICD-10-CM

## 2023-08-10 DIAGNOSIS — F411 Generalized anxiety disorder: Secondary | ICD-10-CM | POA: Diagnosis present

## 2023-08-10 DIAGNOSIS — D5700 Hb-SS disease with crisis, unspecified: Principal | ICD-10-CM | POA: Diagnosis present

## 2023-08-10 DIAGNOSIS — Z888 Allergy status to other drugs, medicaments and biological substances status: Secondary | ICD-10-CM

## 2023-08-10 DIAGNOSIS — Z7901 Long term (current) use of anticoagulants: Secondary | ICD-10-CM

## 2023-08-10 DIAGNOSIS — R262 Difficulty in walking, not elsewhere classified: Secondary | ICD-10-CM | POA: Diagnosis present

## 2023-08-10 DIAGNOSIS — D571 Sickle-cell disease without crisis: Secondary | ICD-10-CM | POA: Diagnosis present

## 2023-08-10 DIAGNOSIS — Z86711 Personal history of pulmonary embolism: Secondary | ICD-10-CM

## 2023-08-10 DIAGNOSIS — D638 Anemia in other chronic diseases classified elsewhere: Secondary | ICD-10-CM | POA: Diagnosis present

## 2023-08-10 DIAGNOSIS — G894 Chronic pain syndrome: Secondary | ICD-10-CM | POA: Diagnosis present

## 2023-08-10 DIAGNOSIS — J9621 Acute and chronic respiratory failure with hypoxia: Secondary | ICD-10-CM | POA: Diagnosis present

## 2023-08-10 DIAGNOSIS — K219 Gastro-esophageal reflux disease without esophagitis: Secondary | ICD-10-CM | POA: Diagnosis present

## 2023-08-10 DIAGNOSIS — Z79899 Other long term (current) drug therapy: Secondary | ICD-10-CM

## 2023-08-10 DIAGNOSIS — Z86718 Personal history of other venous thrombosis and embolism: Secondary | ICD-10-CM

## 2023-08-10 DIAGNOSIS — J189 Pneumonia, unspecified organism: Secondary | ICD-10-CM | POA: Diagnosis present

## 2023-08-10 DIAGNOSIS — Z91048 Other nonmedicinal substance allergy status: Secondary | ICD-10-CM

## 2023-08-10 DIAGNOSIS — E559 Vitamin D deficiency, unspecified: Secondary | ICD-10-CM

## 2023-08-10 DIAGNOSIS — Z832 Family history of diseases of the blood and blood-forming organs and certain disorders involving the immune mechanism: Secondary | ICD-10-CM

## 2023-08-10 DIAGNOSIS — Z21 Asymptomatic human immunodeficiency virus [HIV] infection status: Secondary | ICD-10-CM | POA: Diagnosis present

## 2023-08-10 DIAGNOSIS — Z9981 Dependence on supplemental oxygen: Secondary | ICD-10-CM

## 2023-08-10 LAB — COMPREHENSIVE METABOLIC PANEL WITH GFR
ALT: 15 U/L (ref 0–44)
AST: 22 U/L (ref 15–41)
Albumin: 3.7 g/dL (ref 3.5–5.0)
Alkaline Phosphatase: 57 U/L (ref 38–126)
Anion gap: 7 (ref 5–15)
BUN: 8 mg/dL (ref 6–20)
CO2: 24 mmol/L (ref 22–32)
Calcium: 8.3 mg/dL — ABNORMAL LOW (ref 8.9–10.3)
Chloride: 106 mmol/L (ref 98–111)
Creatinine, Ser: 0.62 mg/dL (ref 0.61–1.24)
GFR, Estimated: >60 mL/min (ref >60)
Glucose, Bld: 112 mg/dL — ABNORMAL HIGH (ref 70–99)
Potassium: 3.1 mmol/L — ABNORMAL LOW (ref 3.5–5.1)
Sodium: 137 mmol/L (ref 135–145)
Total Bilirubin: 7.6 mg/dL — ABNORMAL HIGH (ref 0.0–1.2)
Total Protein: 7.2 g/dL (ref 6.5–8.1)

## 2023-08-10 LAB — CBC WITH DIFFERENTIAL/PLATELET
Abs Immature Granulocytes: 0.04 10*3/uL (ref 0.00–0.07)
Basophils Absolute: 0 10*3/uL (ref 0.0–0.1)
Basophils Relative: 0 %
Eosinophils Absolute: 0.3 10*3/uL (ref 0.0–0.5)
Eosinophils Relative: 3 %
HCT: 23.9 % — ABNORMAL LOW (ref 39.0–52.0)
Hemoglobin: 8.3 g/dL — ABNORMAL LOW (ref 13.0–17.0)
Immature Granulocytes: 0 %
Lymphocytes Relative: 49 %
Lymphs Abs: 5 10*3/uL — ABNORMAL HIGH (ref 0.7–4.0)
MCH: 35.3 pg — ABNORMAL HIGH (ref 26.0–34.0)
MCHC: 34.7 g/dL (ref 30.0–36.0)
MCV: 101.7 fL — ABNORMAL HIGH (ref 80.0–100.0)
Monocytes Absolute: 1.1 10*3/uL — ABNORMAL HIGH (ref 0.1–1.0)
Monocytes Relative: 11 %
Neutro Abs: 3.8 10*3/uL (ref 1.7–7.7)
Neutrophils Relative %: 37 %
Platelets: 247 10*3/uL (ref 150–400)
RBC: 2.35 MIL/uL — ABNORMAL LOW (ref 4.22–5.81)
RDW: 33.1 % — ABNORMAL HIGH (ref 11.5–15.5)
WBC: 10.2 10*3/uL (ref 4.0–10.5)
nRBC: 15.9 % — ABNORMAL HIGH (ref 0.0–0.2)

## 2023-08-10 LAB — RETICULOCYTES
Immature Retic Fract: 31.5 % — ABNORMAL HIGH (ref 2.3–15.9)
Immature Retic Fract: 25.2 % — ABNORMAL HIGH (ref 2.3–15.9)
RBC.: 2.36 MIL/uL — ABNORMAL LOW (ref 4.22–5.81)
RBC.: 2.35 MIL/uL — ABNORMAL LOW (ref 4.22–5.81)
Retic Count, Absolute: 658.5 10*3/uL — ABNORMAL HIGH (ref 19.0–186.0)
Retic Ct Pct: 28 % — ABNORMAL HIGH (ref 0.4–3.1)

## 2023-08-10 MED ORDER — HYDROMORPHONE 1 MG/ML IV SOLN
INTRAVENOUS | Status: DC
  Administered 2023-08-10: 6.5 mg via INTRAVENOUS
  Administered 2023-08-10 – 2023-08-11 (×2): 30 mg via INTRAVENOUS
  Administered 2023-08-11: 4.5 mg via INTRAVENOUS
  Administered 2023-08-11: 5 mg via INTRAVENOUS
  Administered 2023-08-11: 2 mg via INTRAVENOUS
  Administered 2023-08-11: 6 mg via INTRAVENOUS
  Administered 2023-08-12: 3.5 mg via INTRAVENOUS
  Administered 2023-08-12: 5 mg via INTRAVENOUS
  Administered 2023-08-12: 30 mg via INTRAVENOUS
  Administered 2023-08-12: 7 mg via INTRAVENOUS
  Administered 2023-08-13: 1 mg via INTRAVENOUS
  Administered 2023-08-13: 5.5 mg via INTRAVENOUS
  Administered 2023-08-13: 0.1 mg via INTRAVENOUS
  Administered 2023-08-13: 5.5 mg via INTRAVENOUS
  Filled 2023-08-10 (×3): qty 30

## 2023-08-10 MED ORDER — GABAPENTIN 300 MG PO CAPS: 300.0000 mg | ORAL_CAPSULE | Freq: Every evening | ORAL | Status: DC | PRN

## 2023-08-10 MED ORDER — ABACAVIR SULFATE 300 MG PO TABS: 600.0000 mg | ORAL_TABLET | Freq: Every day | ORAL | Status: DC

## 2023-08-10 MED ORDER — ONDANSETRON HCL 4 MG/2ML IJ SOLN
4.0000 mg | Freq: Four times a day (QID) | INTRAMUSCULAR | Status: DC | PRN
  Administered 2023-08-12 – 2023-08-13 (×3): 4 mg via INTRAVENOUS
  Filled 2023-08-10 (×3): qty 2

## 2023-08-10 MED ORDER — CYCLOBENZAPRINE HCL 10 MG PO TABS: 10.0000 mg | ORAL_TABLET | Freq: Two times a day (BID) | ORAL | Status: DC | PRN

## 2023-08-10 MED ORDER — ORAL CARE MOUTH RINSE: 15.0000 mL | OROMUCOSAL | Status: DC | PRN

## 2023-08-10 MED ORDER — HYDROMORPHONE HCL 1 MG/ML IJ SOLN
1.0000 mg | Freq: Once | INTRAMUSCULAR | Status: AC
  Administered 2023-08-10: 1 mg via INTRAVENOUS
  Filled 2023-08-10: qty 1

## 2023-08-10 MED ORDER — HYDROMORPHONE HCL 1 MG/ML IJ SOLN
2.0000 mg | INTRAMUSCULAR | Status: AC
  Administered 2023-08-10: 2 mg via INTRAVENOUS
  Filled 2023-08-10: qty 2

## 2023-08-10 MED ORDER — POTASSIUM CHLORIDE CRYS ER 20 MEQ PO TBCR
40.0000 meq | EXTENDED_RELEASE_TABLET | Freq: Once | ORAL | Status: AC
  Administered 2023-08-10: 40 meq via ORAL
  Filled 2023-08-10: qty 2

## 2023-08-10 MED ORDER — DIPHENHYDRAMINE HCL 50 MG/ML IJ SOLN
25.0000 mg | Freq: Once | INTRAMUSCULAR | Status: AC
  Administered 2023-08-10: 25 mg via INTRAVENOUS
  Filled 2023-08-10: qty 1

## 2023-08-10 MED ORDER — SENNOSIDES-DOCUSATE SODIUM 8.6-50 MG PO TABS
1.0000 | ORAL_TABLET | Freq: Two times a day (BID) | ORAL | Status: DC
  Administered 2023-08-11 – 2023-08-13 (×2): 1 via ORAL
  Filled 2023-08-10 (×4): qty 1

## 2023-08-10 MED ORDER — OXYCODONE HCL 5 MG PO TABS
5.0000 mg | ORAL_TABLET | ORAL | Status: DC | PRN
  Administered 2023-08-11 – 2023-08-12 (×5): 5 mg via ORAL
  Filled 2023-08-10 (×5): qty 1

## 2023-08-10 MED ORDER — OXYCODONE-ACETAMINOPHEN 5-325 MG PO TABS
1.0000 | ORAL_TABLET | ORAL | Status: DC | PRN
  Administered 2023-08-11 – 2023-08-12 (×4): 1 via ORAL
  Filled 2023-08-10 (×4): qty 1

## 2023-08-10 MED ORDER — SODIUM CHLORIDE 0.9 % IV SOLN
500.0000 mg | INTRAVENOUS | Status: DC
  Administered 2023-08-10: 500 mg via INTRAVENOUS
  Filled 2023-08-10: qty 5

## 2023-08-10 MED ORDER — SODIUM CHLORIDE 0.9 % IV SOLN
1.0000 g | INTRAVENOUS | Status: DC
  Administered 2023-08-10: 1 g via INTRAVENOUS
  Filled 2023-08-10: qty 10

## 2023-08-10 MED ORDER — DIPHENHYDRAMINE HCL 25 MG PO CAPS
25.0000 mg | ORAL_CAPSULE | ORAL | Status: DC | PRN
  Administered 2023-08-10 (×2): 25 mg via ORAL
  Filled 2023-08-10 (×2): qty 1

## 2023-08-10 MED ORDER — HYDROXYUREA 500 MG PO CAPS: 2000.0000 mg | ORAL_CAPSULE | Freq: Every day | ORAL | Status: DC

## 2023-08-10 MED ORDER — FOLIC ACID 1 MG PO TABS
1.0000 mg | ORAL_TABLET | Freq: Every day | ORAL | Status: DC
  Administered 2023-08-10 – 2023-08-13 (×4): 1 mg via ORAL
  Filled 2023-08-10 (×5): qty 1

## 2023-08-10 MED ORDER — APIXABAN 5 MG PO TABS
5.0000 mg | ORAL_TABLET | Freq: Two times a day (BID) | ORAL | Status: DC
  Administered 2023-08-10 – 2023-08-13 (×6): 5 mg via ORAL
  Filled 2023-08-10 (×6): qty 1

## 2023-08-10 MED ORDER — DOLUTEGRAVIR SODIUM 50 MG PO TABS
50.0000 mg | ORAL_TABLET | Freq: Every day | ORAL | Status: DC
  Administered 2023-08-10: 50 mg via ORAL
  Filled 2023-08-10: qty 1

## 2023-08-10 MED ORDER — PANTOPRAZOLE SODIUM 40 MG PO TBEC: 40.0000 mg | DELAYED_RELEASE_TABLET | Freq: Every day | ORAL | Status: DC | PRN

## 2023-08-10 MED ORDER — CARMEX CLASSIC LIP BALM EX OINT
TOPICAL_OINTMENT | CUTANEOUS | Status: DC | PRN
  Filled 2023-08-10 (×2): qty 10

## 2023-08-10 MED ORDER — SODIUM CHLORIDE 0.9% FLUSH: 9.0000 mL | INTRAVENOUS | Status: DC | PRN

## 2023-08-10 MED ORDER — OXYCODONE-ACETAMINOPHEN 10-325 MG PO TABS: 1.0000 | ORAL_TABLET | ORAL | Status: DC | PRN

## 2023-08-10 MED ORDER — POLYETHYLENE GLYCOL 3350 17 G PO PACK: 17.0000 g | PACK | Freq: Every day | ORAL | Status: DC | PRN

## 2023-08-10 MED ORDER — POTASSIUM CHLORIDE 20 MEQ PO PACK
40.0000 meq | PACK | Freq: Every day | ORAL | Status: DC
  Administered 2023-08-10 – 2023-08-13 (×4): 40 meq via ORAL
  Filled 2023-08-10 (×4): qty 2

## 2023-08-10 MED ORDER — NALOXONE HCL 0.4 MG/ML IJ SOLN: 0.4000 mg | INTRAMUSCULAR | Status: DC | PRN

## 2023-08-10 NOTE — Plan of Care (Signed)
 Problem: Education: Goal: Knowledge of vaso-occlusive preventative measures will improve 08/10/2023 1706 by Shayla Delude, RN Outcome: Progressing 08/10/2023 1700 by Shayla Delude, RN Outcome: Progressing Goal: Awareness of infection prevention will improve 08/10/2023 1706 by Shayla Delude, RN Outcome: Progressing 08/10/2023 1700 by Shayla Delude, RN Outcome: Progressing Goal: Awareness of signs and symptoms of anemia will improve 08/10/2023 1706 by Shayla Delude, RN Outcome: Progressing 08/10/2023 1700 by Shayla Delude, RN Outcome: Progressing Goal: Long-term complications will improve 08/10/2023 1706 by Shayla Delude, RN Outcome: Progressing 08/10/2023 1700 by Shayla Delude, RN Outcome: Progressing   Problem: Self-Care: Goal: Ability to incorporate actions that prevent/reduce pain crisis will improve 08/10/2023 1706 by Shayla Delude, RN Outcome: Progressing 08/10/2023 1700 by Shayla Delude, RN Outcome: Progressing   Problem: Bowel/Gastric: Goal: Gut motility will be maintained 08/10/2023 1706 by Shayla Delude, RN Outcome: Progressing 08/10/2023 1700 by Shayla Delude, RN Outcome: Progressing   Problem: Tissue Perfusion: Goal: Complications related to inadequate tissue perfusion will be avoided or minimized 08/10/2023 1706 by Shayla Delude, RN Outcome: Progressing 08/10/2023 1700 by Shayla Delude, RN Outcome: Progressing   Problem: Respiratory: Goal: Pulmonary complications will be avoided or minimized 08/10/2023 1706 by Shayla Delude, RN Outcome: Progressing 08/10/2023 1700 by Shayla Delude, RN Outcome: Progressing Goal: Acute Chest Syndrome will be identified early to prevent complications 08/10/2023 1706 by Shayla Delude, RN Outcome: Progressing 08/10/2023 1700 by Shayla Delude, RN Outcome: Progressing   Problem: Fluid Volume: Goal: Ability to maintain a balanced intake and output will improve 08/10/2023 1706 by Shayla Delude,  RN Outcome: Progressing 08/10/2023 1700 by Shayla Delude, RN Outcome: Progressing   Problem: Sensory: Goal: Pain level will decrease with appropriate interventions 08/10/2023 1706 by Shayla Delude, RN Outcome: Progressing 08/10/2023 1700 by Shayla Delude, RN Outcome: Progressing   Problem: Health Behavior: Goal: Postive changes in compliance with treatment and prescription regimens will improve 08/10/2023 1706 by Shayla Delude, RN Outcome: Progressing 08/10/2023 1700 by Shayla Delude, RN Outcome: Progressing   Problem: Education: Goal: Knowledge of General Education information will improve Description: Including pain rating scale, medication(s)/side effects and non-pharmacologic comfort measures 08/10/2023 1706 by Shayla Delude, RN Outcome: Progressing 08/10/2023 1700 by Shayla Delude, RN Outcome: Progressing   Problem: Health Behavior/Discharge Planning: Goal: Ability to manage health-related needs will improve 08/10/2023 1706 by Shayla Delude, RN Outcome: Progressing 08/10/2023 1700 by Shayla Delude, RN Outcome: Progressing   Problem: Clinical Measurements: Goal: Ability to maintain clinical measurements within normal limits will improve 08/10/2023 1706 by Shayla Delude, RN Outcome: Progressing 08/10/2023 1700 by Shayla Delude, RN Outcome: Progressing Goal: Will remain free from infection 08/10/2023 1706 by Shayla Delude, RN Outcome: Progressing 08/10/2023 1700 by Shayla Delude, RN Outcome: Progressing Goal: Diagnostic test results will improve 08/10/2023 1706 by Shayla Delude, RN Outcome: Progressing 08/10/2023 1700 by Shayla Delude, RN Outcome: Progressing Goal: Respiratory complications will improve 08/10/2023 1706 by Shayla Delude, RN Outcome: Progressing 08/10/2023 1700 by Shayla Delude, RN Outcome: Progressing Goal: Cardiovascular complication will be avoided 08/10/2023 1706 by Shayla Delude, RN Outcome: Progressing 08/10/2023 1700  by Shayla Delude, RN Outcome: Progressing   Problem: Activity: Goal: Risk for activity intolerance will decrease 08/10/2023 1706 by Shayla Delude, RN Outcome: Progressing 08/10/2023 1700 by Shayla Delude, RN Outcome: Progressing   Problem: Nutrition: Goal: Adequate nutrition will be  maintained 08/10/2023 1706 by Shayla Delude, RN Outcome: Progressing 08/10/2023 1700 by Shayla Delude, RN Outcome: Progressing   Problem: Coping: Goal: Level of anxiety will decrease 08/10/2023 1706 by Shayla Delude, RN Outcome: Progressing 08/10/2023 1700 by Shayla Delude, RN Outcome: Progressing   Problem: Elimination: Goal: Will not experience complications related to bowel motility 08/10/2023 1706 by Shayla Delude, RN Outcome: Progressing 08/10/2023 1700 by Shayla Delude, RN Outcome: Progressing Goal: Will not experience complications related to urinary retention 08/10/2023 1706 by Shayla Delude, RN Outcome: Progressing 08/10/2023 1700 by Shayla Delude, RN Outcome: Progressing   Problem: Pain Managment: Goal: General experience of comfort will improve and/or be controlled 08/10/2023 1706 by Shayla Delude, RN Outcome: Progressing 08/10/2023 1700 by Shayla Delude, RN Outcome: Progressing   Problem: Safety: Goal: Ability to remain free from injury will improve 08/10/2023 1706 by Shayla Delude, RN Outcome: Progressing 08/10/2023 1700 by Shayla Delude, RN Outcome: Progressing   Problem: Skin Integrity: Goal: Risk for impaired skin integrity will decrease 08/10/2023 1706 by Shayla Delude, RN Outcome: Progressing 08/10/2023 1700 by Shayla Delude, RN Outcome: Progressing

## 2023-08-10 NOTE — ED Notes (Signed)
 Notified provider that pt o2 now on 5L

## 2023-08-10 NOTE — ED Notes (Addendum)
 Asked provider for more pain meds

## 2023-08-10 NOTE — Plan of Care (Signed)

## 2023-08-10 NOTE — ED Notes (Signed)
 PT OXYGEN  IS 87% INFORMED THE NURSE

## 2023-08-10 NOTE — ED Notes (Addendum)
 I raised pt's o2 to 4LNC per 86% on 3LNC BSL, provider aware

## 2023-08-10 NOTE — ED Triage Notes (Signed)
 Pt arrives ambulatory to triage, reporting "all over" sickle cell pain, has been "on and off" for several days. Last took oxycodone  around 2100. Pt is on chronic 3 liters.

## 2023-08-10 NOTE — ED Notes (Signed)
 Declined repeat temp

## 2023-08-10 NOTE — ED Notes (Signed)
 Endorses pain in hips and back R sided

## 2023-08-10 NOTE — ED Notes (Signed)
 Upped pts o2 to 5L dt 89% on 4L

## 2023-08-10 NOTE — ED Notes (Addendum)
 In bed in nad at this time, given two sodas per request, asked provider for pain meds

## 2023-08-10 NOTE — H&P (Signed)
 H&P  Patient Demographics:  Martin [name], is a 33 y.o. male  MRN: [id number]   DOB - 1990-06-02  Admit Date - 08/10/2023  Outpatient Primary MD for the patient is [name], [name], [name]  Chief Complaint  Patient presents with   Sickle Cell Pain Crisis      HPI:   Martin [name]  is a 33 y.o. male with history of sickle cell disease, Martin [name] is a 33 year old-male with a medical history significant for sickle cell disease, chronic pain syndrome, opiate dependency and tolerance, history of HIV, and history of PE on Xarelto.  Patient presents to emergency department with generalize body pain and lower extremity pain. Patient reports that he has been taking his oxycodone  at home without improvement. He was seen at the sickle cell pain clinic yesterday for the same symptoms. Pain was controlled and patient was discharged home, however the pain returned. He is on 3l oxygen  at home. Denies cough, fever, chest pain, diarrhea /vomiting. He has had no sick contacts.   ED course: BP 107/67  Pulse 75  Temp 97.9 F (36.6 C) (Oral)  Resp 13  SpO2 (!) 88% , HGB 8.3 g/dl, Patient treated with dilaudid  and IVF with no improvement to pain symptoms. Admitted inpatient for ongoing sickle cell pain management.  Labs Reviewed  CBC WITH DIFFERENTIAL/PLATELET - Abnormal; Notable for the following components:      Result Value   RBC 2.35 (*)    Hemoglobin 8.3 (*)    HCT 23.9 (*)    MCV 101.7 (*)    MCH 35.3 (*)    RDW 33.1 (*)    nRBC 15.9 (*)    Lymphs Abs 5.0 (*)    Monocytes Absolute 1.1 (*)    All other components within normal limits  COMPREHENSIVE METABOLIC PANEL WITH GFR - Abnormal; Notable for the following components:   Potassium 3.1 (*)    Glucose, Bld 112 (*)    Calcium  8.3 (*)    Total Bilirubin 7.6 (*)    All other components within normal limits  RETICULOCYTES - Abnormal; Notable for the following components:   RBC. 2.36 (*)    Immature Retic Fract 31.5 (*)    All  other components within normal limits  HIV ANTIBODY (ROUTINE TESTING W REFLEX)       Review of systems:  In addition to the HPI above, patient reports No fever or chills No Headache, No changes with vision or hearing No problems swallowing food or liquids No chest pain, cough or shortness of breath No abdominal pain, No nausea or vomiting, Bowel movements are regular No blood in stool or urine No dysuria No new skin rashes or bruises No new joints pains-aches No new weakness, tingling, numbness in any extremity No recent weight gain or loss No polyuria, polydypsia or polyphagia No significant Mental Stressors  A full 10 point Review of Systems was done, except as stated above, all other Review of Systems were negative.  With Past History of the following :   Past Medical History:  Diagnosis Date   Anxiety    HIV (human immunodeficiency virus infection) (HCC)    Proteinuria    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HCC)    Vitamin D  deficiency 10/2018      Past Surgical History:  Procedure Laterality Date   IR IMAGING GUIDED PORT INSERTION  08/29/2019   IR REMOVAL TUN ACCESS W/ PORT W/O FL MOD SED  08/30/2020   TEE WITHOUT CARDIOVERSION N/A 08/30/2020  Procedure: TRANSESOPHAGEAL ECHOCARDIOGRAM (TEE);  Surgeon: Hazle Lites, MD;  Location: [facility] ENDOSCOPY;  Service: Cardiovascular;  Laterality: N/A;     Social History:   Social History   Tobacco Use   Smoking status: Never   Smokeless tobacco: Never  Substance Use Topics   Alcohol  use: No     Lives - At home   Family History :   Family History  Problem Relation Age of Onset   Sickle cell trait Mother    Sickle cell trait Father    Birth defects Maternal Grandmother    Birth defects Paternal Grandmother      Home Medications:   Prior to Admission medications   Medication Sig Start Date End Date Taking? Authorizing Provider  abacavir  (ZIAGEN ) 300 MG tablet Take 600 mg by mouth at bedtime. 12/30/22   [provider]  apixaban  (ELIQUIS ) 5 MG TABS tablet Take 1 tablet (5 mg total) by mouth 2 (two) times daily. 12/02/21   Sigurd Driver, [name]  cyclobenzaprine  (FLEXERIL ) 10 MG tablet Take 10 mg by mouth 2 (two) times daily as needed for muscle spasms. 12/22/22   [provider]  Deferiprone  (FERRIPROX ) 500 MG TABS Take 1 tablet by mouth daily. Patient not taking: Reported on 08/04/2023 10/19/22   Sigurd Driver, [name]  ergocalciferol  (VITAMIN D2) 1.25 MG (50000 UT) capsule Take 1 capsule (50,000 Units total) by mouth once a week. Patient taking differently: Take 50,000 Units by mouth once a week. Tues 10/14/22   Sigurd Driver, [name]  folic acid  (FOLVITE ) 1 MG tablet Take 1 mg by mouth daily. 10/10/21   [provider]  gabapentin  (NEURONTIN ) 300 MG capsule Take 300 mg by mouth at bedtime as needed (nerve pain).    [provider]  hydroxyurea  (HYDREA ) 500 MG capsule Take 4 capsules (2,000 mg total) by mouth at bedtime. 05/27/23   Jegede, Olugbemiga E, MD  NARCAN  4 MG/0.1ML LIQD nasal spray kit Place 1 spray into the nose once. 01/07/23   [provider]  oxyCODONE -acetaminophen  (PERCOCET) 10-325 MG tablet Take 1 tablet by mouth every 4 (four) hours as needed for pain. 08/04/23   [name], [name], [name]  pantoprazole  (PROTONIX ) 40 MG tablet Take 40 mg by mouth daily as needed (acid reflux). 12/25/22   [provider]  TIVICAY  50 MG tablet Take 50 mg by mouth at bedtime. 12/30/22   [provider]  triamcinolone  cream (KENALOG ) 0.1 % Apply 1 Application topically 2 (two) times daily. 01/12/23   Sigurd Driver, [name]     Allergies:   Allergies  Allergen Reactions   Ketorolac  Tromethamine  Swelling and Other (See Comments)    Patient reports facial edema and left arm edema after administration.   Tape Rash and Other (See Comments)    PLEASE DO NOT USE THE CLEAR, THICK, "PLASTIC" TAPE- only paper tape is tolerated    Wound Dressing Adhesive Rash     Physical  Exam:   Vitals:   Vitals:   08/10/23 1330 08/10/23 1345  BP: 111/77 111/78  Pulse: 69 71  Resp: 18 13  Temp:    SpO2: 93% 94%    Physical Exam: Constitutional: Patient appears well-developed and well-nourished. Not in obvious distress. HENT: Normocephalic, atraumatic, External right and left ear normal. Oropharynx is clear and moist.  Eyes: Conjunctivae and EOM are normal. PERRLA, no scleral icterus. Neck: Normal ROM. Neck supple. No JVD. No tracheal deviation. No thyromegaly. CVS: RRR, S1/S2 +, no murmurs, no gallops,  no carotid bruit.  Pulmonary: Effort and breath sounds normal, no stridor, rhonchi, wheezes, rales.  Abdominal: Soft. BS +, no distension, tenderness, rebound or guarding.  Musculoskeletal: Generalize body pain, lower back pain  Lymphadenopathy: No lymphadenopathy noted, cervical, inguinal or axillary Neuro: Alert. Normal reflexes, muscle tone coordination. No cranial nerve deficit. Skin: Skin is warm and dry. No rash noted. Not diaphoretic. No erythema. No pallor. Psychiatric: Normal mood and affect. Behavior, judgment, thought content normal.   Data Review:   CBC Recent Labs  Lab 08/05/23 0250 08/09/23 1035 08/10/23 0430  WBC 9.6 8.9 10.2  HGB 8.0* 8.2* 8.3*  HCT 16.3* 23.8* 23.9*  PLT 216 220 247  MCV 100.6* 102.1* 101.7*  MCH 49.4* 35.2* 35.3*  MCHC 49.1* 34.5 34.7  RDW 38.5* 33.7* 33.1*  LYMPHSABS 4.5* 3.5 5.0*  MONOABS 1.3* 1.0 1.1*  EOSABS 0.1 0.1 0.3  BASOSABS 0.0 0.0 0.0   ------------------------------------------------------------------------------------------------------------------  Chemistries  Recent Labs  Lab 08/05/23 0250 08/09/23 1035 08/10/23 0430  NA 140 143 137  K 3.2* 3.4* 3.1*  CL 111 116* 106  CO2 21* 21* 24  GLUCOSE 78 85 112*  BUN 8 8 8   CREATININE 0.76 0.51* 0.62  CALCIUM  7.9* 7.9* 8.3*  AST 22 23 22   ALT 16 15 15   ALKPHOS 50 51 57  BILITOT 4.4* 7.6* 7.6*    ------------------------------------------------------------------------------------------------------------------ estimated creatinine clearance is 118 mL/min (by C-G formula based on SCr of 0.62 mg/dL). ------------------------------------------------------------------------------------------------------------------ No results for input(s): "TSH", "T4TOTAL", "T3FREE", "THYROIDAB" in the last 72 hours.  Invalid input(s): "FREET3"  Coagulation profile No results for input(s): "INR", "PROTIME" in the last 168 hours. ------------------------------------------------------------------------------------------------------------------- No results for input(s): "DDIMER" in the last 72 hours. -------------------------------------------------------------------------------------------------------------------  Cardiac Enzymes No results for input(s): "CKMB", "TROPONINI", "MYOGLOBIN" in the last 168 hours.  Invalid input(s): "CK" ------------------------------------------------------------------------------------------------------------------    Component Value Date/Time   BNP 91.4 05/27/2021 2216    ---------------------------------------------------------------------------------------------------------------  Urinalysis    Component Value Date/Time   COLORURINE AMBER (A) 06/20/2023 0311   APPEARANCEUR CLOUDY (A) 06/20/2023 0311   LABSPEC 1.014 06/20/2023 0311   PHURINE 5.0 06/20/2023 0311   GLUCOSEU NEGATIVE 06/20/2023 0311   HGBUR MODERATE (A) 06/20/2023 0311   BILIRUBINUR NEGATIVE 06/20/2023 0311   BILIRUBINUR small (A) 03/18/2021 1218   BILIRUBINUR neg 11/27/2019 1149   KETONESUR NEGATIVE 06/20/2023 0311   PROTEINUR 100 (A) 06/20/2023 0311   UROBILINOGEN 2.0 (A) 03/18/2021 1218   UROBILINOGEN 4.0 (H) 05/21/2009 1944   NITRITE NEGATIVE 06/20/2023 0311   LEUKOCYTESUR NEGATIVE 06/20/2023 0311     ----------------------------------------------------------------------------------------------------------------   Imaging Results:    No results found.   Assessment & Plan:  Active Problems:   Generalized anxiety disorder   GERD (gastroesophageal reflux disease)   Hb-SS disease without crisis (HCC)   Sickle cell anemia with pain (HCC)   Hb Sickle Cell Disease with crisis: Admit patient, start IVF 0.45% Saline @ 125 mls/hour, start weight based Dilaudid  PCA, start IV Toradol  15 mg Q 6 H, Restart oral home pain medications, Monitor vitals very closely, Re-evaluate pain scale regularly, 2 L of Oxygen  by [place], Patient will be re-evaluated for pain in the context of function and relationship to baseline as care progresses. Leukocytosis: Stable Anemia of Chronic Disease: Hemoglobin at base line no need for transfusion at this time. Will continue to monitor. Daily CBC in place.  Chronic pain Syndrome: Continue oral home mediation GERD: continue Protonix  as ordered.     DVT Prophylaxis: Subcut Lovenox   AM Labs Ordered, also please review Full Orders  Family Communication: Admission, patient's condition and plan of care including tests being ordered have been discussed with the patient who indicate understanding and agree with the plan and Code Status.  Code Status: Full Code  Consults called: None    Admission status: Inpatient    Time spent in minutes : 50 minutes  Lorel Roes NP 08/10/2023 at 2:48 PM

## 2023-08-10 NOTE — ED Provider Triage Note (Signed)
 Emergency Medicine Provider Triage Evaluation Note  Martin [name] , a 33 y.o. male  was evaluated in triage.  Pt complains of sickle cell pain crisis.  Pain ongoing all day yesterday and tonight.  Seen at [facility] yesterday and discharged.  Pain ongoing-- mostly in low back and hips which is typical.  No chest pain or SOB.  On chronic 3L.  No  fevers.  Review of Systems  Positive: SCC Negative: fever  Physical Exam  BP 102/63 (BP Location: Right Arm)   Pulse 78   Temp 98 F (36.7 C)   Resp 15   SpO2 94%  Gen:   Awake, no distress   Resp:  Normal effort  MSK:   Moves extremities without difficulty  Other:  On 3L, baseline  Medical Decision Making  Medically screening exam initiated at 2:35 AM.  Appropriate orders placed.  Martin [name] was informed that the remainder of the evaluation will be completed by another provider, this initial triage assessment does not replace that evaluation, and the importance of remaining in the ED until their evaluation is complete.  Labs ordered.  VSS on baseline 3L.   Martin Dew, PA-C 08/10/23 0236

## 2023-08-10 NOTE — ED Provider Notes (Signed)
 [place] EMERGENCY DEPARTMENT AT [facility] Provider Note   CSN: [id number] Arrival date & time: 08/10/23  0214     History  Chief Complaint  Patient presents with   Sickle Cell Pain Crisis    Martin [name] is a 33 y.o. male with a history of sickle cell disease and HIV who presents the ED today for sickle cell pain.  Patient reports pain "all over," but worse in his low back, which is normal for him.  He has been taking oxycodone  at home without improvement.  Was seen at the sickle cell pain clinic yesterday as well without much improvement of symptoms.  Denies any pain in the chest or shortness of breath.  He is chronically on 3 L of oxygen .    Home Medications Prior to Admission medications   Medication Sig Start Date End Date Taking? Authorizing Provider  abacavir  (ZIAGEN ) 300 MG tablet Take 600 mg by mouth at bedtime. 12/30/22   [provider]  apixaban  (ELIQUIS ) 5 MG TABS tablet Take 1 tablet (5 mg total) by mouth 2 (two) times daily. 12/02/21   Sigurd Driver, [name]  cyclobenzaprine  (FLEXERIL ) 10 MG tablet Take 10 mg by mouth 2 (two) times daily as needed for muscle spasms. 12/22/22   [provider]  Deferiprone  (FERRIPROX ) 500 MG TABS Take 1 tablet by mouth daily. Patient not taking: Reported on 08/04/2023 10/19/22   Sigurd Driver, [name]  ergocalciferol  (VITAMIN D2) 1.25 MG (50000 UT) capsule Take 1 capsule (50,000 Units total) by mouth once a week. Patient taking differently: Take 50,000 Units by mouth once a week. Tues 10/14/22   Sigurd Driver, [name]  folic acid  (FOLVITE ) 1 MG tablet Take 1 mg by mouth daily. 10/10/21   [provider]  gabapentin  (NEURONTIN ) 300 MG capsule Take 300 mg by mouth at bedtime as needed (nerve pain).    [provider]  hydroxyurea  (HYDREA ) 500 MG capsule Take 4 capsules (2,000 mg total) by mouth at bedtime. 05/27/23   Jegede, Olugbemiga E, MD  NARCAN  4 MG/0.1ML LIQD nasal spray kit Place 1 spray  into the nose once. Patient not taking: Reported on 08/04/2023 01/07/23   [provider]  oxyCODONE -acetaminophen  (PERCOCET) 10-325 MG tablet Take 1 tablet by mouth every 4 (four) hours as needed for pain. 08/04/23   [name], [name], [name]  pantoprazole  (PROTONIX ) 40 MG tablet Take 40 mg by mouth daily as needed (acid reflux). 12/25/22   [provider]  TIVICAY  50 MG tablet Take 50 mg by mouth at bedtime. 12/30/22   [provider]  triamcinolone  cream (KENALOG ) 0.1 % Apply 1 Application topically 2 (two) times daily. 01/12/23   Sigurd Driver, [name]      Allergies    Ketorolac  tromethamine , Tape, and Wound dressing adhesive    Review of Systems   Review of Systems  Hematological:        Sickle cell pain  All other systems reviewed and are negative.   Physical Exam Updated Vital Signs BP 107/67   Pulse 75   Temp 97.9 F (36.6 C) (Oral)   Resp 13   SpO2 (!) 88%  Physical Exam Vitals and nursing note reviewed.  Constitutional:      General: He is not in acute distress.    Appearance: Normal appearance.  HENT:     Head: Normocephalic and atraumatic.     Mouth/Throat:     Mouth: Mucous membranes are moist.  Eyes:  Conjunctiva/sclera: Conjunctivae normal.     Pupils: Pupils are equal, round, and reactive to light.  Cardiovascular:     Rate and Rhythm: Normal rate and regular rhythm.     Pulses: Normal pulses.     Heart sounds: Normal heart sounds.  Pulmonary:     Effort: Pulmonary effort is normal.     Breath sounds: Normal breath sounds.     Comments: On 3L supplemental oxygen , at baseline Abdominal:     Palpations: Abdomen is soft.     Tenderness: There is no abdominal tenderness.  Musculoskeletal:        General: Normal range of motion.     Cervical back: Normal range of motion.  Skin:    General: Skin is warm and dry.     Findings: No rash.  Neurological:     General: No focal deficit present.     Mental Status: He is alert.   Psychiatric:        Mood and Affect: Mood normal.        Behavior: Behavior normal.    ED Results / Procedures / Treatments   Labs (all labs ordered are listed, but only abnormal results are displayed) Labs Reviewed  CBC WITH DIFFERENTIAL/PLATELET - Abnormal; Notable for the following components:      Result Value   RBC 2.35 (*)    Hemoglobin 8.3 (*)    HCT 23.9 (*)    MCV 101.7 (*)    MCH 35.3 (*)    RDW 33.1 (*)    nRBC 15.9 (*)    Lymphs Abs 5.0 (*)    Monocytes Absolute 1.1 (*)    All other components within normal limits  COMPREHENSIVE METABOLIC PANEL WITH GFR - Abnormal; Notable for the following components:   Potassium 3.1 (*)    Glucose, Bld 112 (*)    Calcium  8.3 (*)    Total Bilirubin 7.6 (*)    All other components within normal limits  RETICULOCYTES - Abnormal; Notable for the following components:   RBC. 2.36 (*)    Immature Retic Fract 31.5 (*)    All other components within normal limits    EKG None  Radiology No results found.  Procedures Procedures    Medications Ordered in ED Medications  naloxone  (NARCAN ) injection 0.4 mg (has no administration in time range)    And  sodium chloride  flush (NS) 0.9 % injection 9 mL (has no administration in time range)  ondansetron  (ZOFRAN ) injection 4 mg (has no administration in time range)  diphenhydrAMINE  (BENADRYL ) capsule 25 mg (has no administration in time range)  HYDROmorphone  (DILAUDID ) 1 mg/mL PCA injection (has no administration in time range)  HYDROmorphone  (DILAUDID ) injection 1 mg (1 mg Intravenous Given 08/10/23 0428)  HYDROmorphone  (DILAUDID ) injection 2 mg (2 mg Intravenous Given 08/10/23 0821)  HYDROmorphone  (DILAUDID ) injection 2 mg (2 mg Intravenous Given 08/10/23 0852)  diphenhydrAMINE  (BENADRYL ) injection 25 mg (25 mg Intravenous Given 08/10/23 0906)  potassium chloride  SA (KLOR-CON  M) CR tablet 40 mEq (40 mEq Oral Given 08/10/23 1016)    ED Course/ Medical Decision Making/ A&P                                  Medical Decision Making Risk Prescription drug management. Decision regarding hospitalization.   This patient presents to the ED for concern of sickle cell pain, this involves an extensive number of treatment options, and is a complaint  that carries with it a high risk of complications and morbidity.   Differential diagnosis includes: sickle cell pain crisis, acute chest syndrome, vaso-occlusive pain crisis, etc.   Comorbidities  See HPI above   Additional History  Additional history obtained from prior records   Lab Tests  I ordered and personally interpreted labs.  The pertinent results include:   Potassium of 3.1 otherwise CMP and CBC are within normal limits for patient Retic count is within normal limits for patient as well  Consultations  I requested consultation with Marylu Soda NP with the [facility],  and discussed lab and imaging findings as well as pertinent plan - they recommend: she will come and see patient. After evaluation, she will admit patient for further work up and management of symptoms.   Problem List / ED Course / Critical Interventions / Medication Management  Patient reports pain all over, worse at the low back, which is common for him and his sickle cell pain. Went to the clinic yesterday and was discharged home. States pain did not improve there. Tried home meds without relief as well. No chest pain or SOB. I ordered medications including: Dilaudid  for pain x3 Reevaluation of the patient after these medicines showed that the patient stayed the same I have reviewed the patients home medicines and have made adjustments as needed   Social Determinants of Health  Access to healthcare   Test / Admission - Considered  Discussed findings with patient. He's agreeable with plan for admission.       Final Clinical Impression(s) / ED Diagnoses Final diagnoses:  Sickle cell anemia with pain [facility])    Rx / DC Orders ED  Discharge Orders     None         Sonnie Dusky, PA-C 08/10/23 1259    Ninetta Basket, MD 08/15/23 [phone]

## 2023-08-10 NOTE — ED Notes (Signed)
 Given ham sandwich and coke per request. Notified provider that pt looking for bendryl

## 2023-08-11 LAB — HIV ANTIBODY (ROUTINE TESTING W REFLEX): HIV Screen 4th Generation wRfx: REACTIVE — AB

## 2023-08-11 LAB — MRSA NEXT GEN BY PCR, NASAL: MRSA by PCR Next Gen: NOT DETECTED

## 2023-08-11 MED ORDER — VANCOMYCIN HCL 1250 MG/250ML IV SOLN
1250.0000 mg | Freq: Three times a day (TID) | INTRAVENOUS | Status: DC
  Filled 2023-08-11: qty 250

## 2023-08-11 MED ORDER — SODIUM CHLORIDE 0.9 % IV SOLN
2.0000 g | Freq: Three times a day (TID) | INTRAVENOUS | Status: DC
  Administered 2023-08-11: 2 g via INTRAVENOUS
  Filled 2023-08-11: qty 12.5

## 2023-08-11 MED ORDER — BISACODYL 10 MG RE SUPP
10.0000 mg | Freq: Once | RECTAL | Status: AC
  Administered 2023-08-11: 10 mg via RECTAL
  Filled 2023-08-11: qty 1

## 2023-08-11 MED ORDER — AZITHROMYCIN 250 MG PO TABS
500.0000 mg | ORAL_TABLET | Freq: Every day | ORAL | Status: DC
  Administered 2023-08-11 – 2023-08-12 (×2): 500 mg via ORAL
  Filled 2023-08-11 (×2): qty 2

## 2023-08-11 MED ORDER — DIPHENHYDRAMINE HCL 50 MG/ML IJ SOLN
12.5000 mg | Freq: Once | INTRAMUSCULAR | Status: AC
  Administered 2023-08-11: 12.5 mg via INTRAVENOUS
  Filled 2023-08-11: qty 1

## 2023-08-11 MED ORDER — SODIUM CHLORIDE 0.9 % IV SOLN
2.0000 g | INTRAVENOUS | Status: DC
  Administered 2023-08-11: 2 g via INTRAVENOUS
  Filled 2023-08-11: qty 20

## 2023-08-11 MED ORDER — HYDROXYUREA 500 MG PO CAPS
2000.0000 mg | ORAL_CAPSULE | Freq: Every day | ORAL | Status: DC
  Administered 2023-08-11 – 2023-08-12 (×2): 2000 mg via ORAL
  Filled 2023-08-11 (×2): qty 4

## 2023-08-11 MED ORDER — VANCOMYCIN HCL IN DEXTROSE 1-5 GM/200ML-% IV SOLN
1000.0000 mg | Freq: Three times a day (TID) | INTRAVENOUS | Status: DC
  Administered 2023-08-11: 1000 mg via INTRAVENOUS
  Filled 2023-08-11: qty 200

## 2023-08-11 NOTE — Progress Notes (Cosign Needed)
 Patient ID: Martin [name], male   DOB: Apr 10, 1991, 33 y.o.   MRN: [id number] Subjective: Martin [name]  is a 33 y.o. male with history of sickle cell disease, Martin [name] is a 33 year old-male with a medical history significant for sickle cell disease, chronic pain syndrome, opiate dependency and tolerance, history of HIV, and history of PE on Xarelto.  Patient presents to emergency department with generalize body pain and lower extremity pain. Patient reports that he has been taking his oxycodone  at home without improvement. contacts.  Continues to report pain 9/10 today and chest, lower back and bilateral lower extremity.  Objective:  Vital signs in last 24 hours:  Vitals:   08/11/23 0944 08/11/23 1017 08/11/23 1313 08/11/23 1706  BP: 107/73  119/82   Pulse: 77  82   Resp: 20 19 20 19   Temp: 97.9 F (36.6 C)  98.7 F (37.1 C)   TempSrc: Oral  Oral   SpO2: (!) 88% 91% 93% 95%  Weight:      Height:        Intake/Output from previous day:   Intake/Output Summary (Last 24 hours) at 08/11/2023 1750 Last data filed at 08/11/2023 1103 Gross per 24 hour  Intake 626.5 ml  Output 2350 ml  Net -1723.5 ml    Physical Exam: General: Alert, awake, oriented x3, in no acute distress.  HEENT: [place]/AT PEERL, EOMI Neck: Trachea midline,  no masses, no thyromegal,y no JVD, no carotid bruit OROPHARYNX:  Moist, No exudate/ erythema/lesions.  Heart: Regular rate and rhythm, without murmurs, rubs, gallops, PMI non-displaced, no heaves or thrills on palpation.  Lungs: Clear to auscultation, no wheezing or rhonchi noted. No increased vocal fremitus resonant to percussion  Abdomen: Soft, nontender, nondistended, positive bowel sounds, no masses no hepatosplenomegaly noted..  Neuro: No focal neurological deficits noted cranial nerves II through XII grossly intact. DTRs 2+ bilaterally upper and lower extremities. Strength 5 out of 5 in bilateral upper and lower extremities. Musculoskeletal:  Chest, bilateral lower extremity, lower back pain. Psychiatric: Patient alert and oriented x3, good insight and cognition, good recent to remote recall. Lymph node survey: No cervical axillary or inguinal lymphadenopathy noted.  Lab Results:  Basic Metabolic Panel:    Component Value Date/Time   NA 137 08/10/2023 0430   NA 140 01/12/2023 1607   K 3.1 (L) 08/10/2023 0430   CL 106 08/10/2023 0430   CO2 24 08/10/2023 0430   BUN 8 08/10/2023 0430   BUN 8 01/12/2023 1607   CREATININE 0.62 08/10/2023 0430   GLUCOSE 112 (H) 08/10/2023 0430   CALCIUM  8.3 (L) 08/10/2023 0430   CBC:    Component Value Date/Time   WBC 10.2 08/10/2023 0430   HGB 8.3 (L) 08/10/2023 0430   HGB 7.4 (L) 01/12/2023 1607   HCT 23.9 (L) 08/10/2023 0430   HCT 21.7 (L) 01/12/2023 1607   PLT 247 08/10/2023 0430   PLT 321 01/12/2023 1607   MCV 101.7 (H) 08/10/2023 0430   MCV 94 01/12/2023 1607   NEUTROABS 3.8 08/10/2023 0430   NEUTROABS 3.8 01/12/2023 1607   LYMPHSABS 5.0 (H) 08/10/2023 0430   LYMPHSABS 5.6 (H) 01/12/2023 1607   MONOABS 1.1 (H) 08/10/2023 0430   EOSABS 0.3 08/10/2023 0430   EOSABS 0.2 01/12/2023 1607   BASOSABS 0.0 08/10/2023 0430   BASOSABS 0.1 01/12/2023 1607    Recent Results (from the past 240 hours)  MRSA Next Gen by PCR, Nasal     Status: None   Collection Time: 08/11/23  11:08 AM   Specimen: Nasal Mucosa; Nasal Swab  Result Value Ref Range Status   MRSA by PCR Next Gen NOT DETECTED NOT DETECTED Final    Comment: (NOTE) The GeneXpert MRSA Assay (FDA approved for NASAL specimens only), is one component of a comprehensive MRSA colonization surveillance program. It is not intended to diagnose MRSA infection nor to guide or monitor treatment for MRSA infections. Test performance is not FDA approved in patients less than 58 years old. Performed at [facility], [address] [place], [place] [address]     Studies/Results: DG Chest 2 View Result Date:  08/10/2023 CLINICAL DATA:  Decreased lung sounds EXAM: CHEST - 2 VIEW COMPARISON:  Chest radiograph June 19, 2023 FINDINGS: Port-A-Cath tip projects over the superior vena cava. Monitoring leads overlie the patient. Stable cardiac and mediastinal contours. Interval development of bilateral predominantly mid lung peripheral areas of consolidation. This is upon a background of chronic interstitial opacities. No definite pleural effusion or pneumothorax. Osseous structures unremarkable. IMPRESSION: Interval development of bilateral predominantly mid lung peripheral areas of consolidation. Findings are concerning for multifocal pneumonia. Electronically Signed   By: Jone Neither M.D.   On: 08/10/2023 14:48    Medications: Scheduled Meds:  apixaban   5 mg Oral BID   azithromycin   500 mg Oral QHS   folic acid   1 mg Oral Daily   HYDROmorphone    Intravenous Q4H   hydroxyurea   2,000 mg Oral QHS   potassium chloride   40 mEq Oral Daily   senna-docusate  1 tablet Oral BID   Continuous Infusions:  cefTRIAXone  (ROCEPHIN )  IV 2 g (08/11/23 1704)   PRN Meds:.cyclobenzaprine , diphenhydrAMINE , gabapentin , lip balm, naloxone  **AND** sodium chloride  flush, ondansetron  (ZOFRAN ) IV, mouth rinse, oxyCODONE -acetaminophen  **AND** oxyCODONE , pantoprazole , polyethylene glycol  Consultants: None  Procedures: None  Antibiotics: Azithromycin , cefepime .  Assessment/Plan: Active Problems:   Generalized anxiety disorder   GERD (gastroesophageal reflux disease)   Hb-SS disease without crisis (HCC)   Sickle cell anemia with pain (HCC)   Hb Sickle Cell Disease with Pain crisis: Continue IVF 0.45% Saline @ 125 mls/hour, continue weight based Dilaudid  PCA, IV Toradol  15 mg Q 6 H for a total of 5 days, continue oral home pain medications as ordered. Monitor vitals very closely, Re-evaluate pain scale regularly, 2 L of Oxygen  by [place]. Patient encouraged to ambulate on the hallway today.  Leukocytosis: Elevated due to  pneumonia infection, patient currently on antibiotics. Anemia of Chronic Disease: Hemoglobin 8.3 g/dL below patient's baseline however no transfusion at this time.  Will continue to monitor.  Daily CBC in place. Chronic pain Syndrome: Continue oral home pain medication.  Code Status: Full Code Family Communication: N/A Disposition Plan: Not yet ready for discharge  Lorel Roes NP  If 7PM-7AM, please contact night-coverage.  08/11/2023, 5:50 PM  LOS: 1 day

## 2023-08-11 NOTE — Plan of Care (Signed)

## 2023-08-11 NOTE — Plan of Care (Addendum)
 Brief id note  Out of care hiv patient; last seen 2022 Lab Results  Component Value Date   CD4TCELL SEE SEPARATE REPORT 08/14/2021   CD4TABS 1,129 04/04/2020   Lab Results  Component Value Date   HIV1RNAQUANT 3,000 04/04/2020    -saw id in [place]  (previously also was seen at [facility]); on triumeq  -wouldn't start art here if hasn't been taking/noncompliant -if hiv rna test suggest he is taking his art, will restart though -will schedule an intake with [facility] to reengage in care; appointment should be available prior to discharge -cd4/hiv rna quant -discussed with primary team

## 2023-08-11 NOTE — Progress Notes (Signed)
 Pharmacy Antibiotic Note  Martin [name] is a 33 y.o. male admitted on 08/10/2023 with  sickle cell disease with crisis, concerns for pneumonia .  Pharmacy has been consulted for vancomycin  and cefepime  dosing.  Plan: Vancomycin  1000 mg IV q8h (eAUC  409.4, Cmin 9.4) Cefepime  2 g IV q8h Monitor renal function, MRSA nares swab, follow for de-escalation  Height: 6\' 3"  (190.5 cm) Weight: 59 kg (130 lb) IBW/kg (Calculated) : 84.5  Temp (24hrs), Avg:98.4 F (36.9 C), Min:97.9 F (36.6 C), Max:99.1 F (37.3 C)  Recent Labs  Lab 08/05/23 0250 08/09/23 1035 08/10/23 0430  WBC 9.6 8.9 10.2  CREATININE 0.76 0.51* 0.62    Estimated Creatinine Clearance: 109.6 mL/min (by C-G formula based on SCr of 0.62 mg/dL).    Allergies  Allergen Reactions   Ketorolac  Tromethamine  Swelling and Other (See Comments)    Patient reports facial edema and left arm edema after administration.   Tape Rash and Other (See Comments)    PLEASE DO NOT USE THE CLEAR, THICK, "PLASTIC" TAPE- only paper tape is tolerated    Wound Dressing Adhesive Rash    Antimicrobials this admission: Ceftriaxone  4/22 >> 4/22 Cefepime  4/23 >>  Vancomycin  4/23 >>   Dose adjustments this admission: N/a  Microbiology results: None  Thank you for allowing pharmacy to be a part of this patient's care.  Saunders Curio 08/11/2023 8:51 AM

## 2023-08-11 NOTE — Plan of Care (Signed)
  Problem: Education: Goal: Knowledge of vaso-occlusive preventative measures will improve Outcome: Progressing Goal: Awareness of infection prevention will improve Outcome: Progressing Goal: Awareness of signs and symptoms of anemia will improve Outcome: Progressing Goal: Long-term complications will improve Outcome: Progressing   Problem: Self-Care: Goal: Ability to incorporate actions that prevent/reduce pain crisis will improve Outcome: Progressing   Problem: Bowel/Gastric: Goal: Gut motility will be maintained Outcome: Progressing   Problem: Tissue Perfusion: Goal: Complications related to inadequate tissue perfusion will be avoided or minimized Outcome: Progressing   Problem: Respiratory: Goal: Pulmonary complications will be avoided or minimized Outcome: Progressing Goal: Acute Chest Syndrome will be identified early to prevent complications Outcome: Progressing   Problem: Fluid Volume: Goal: Ability to maintain a balanced intake and output will improve Outcome: Progressing   Problem: Sensory: Goal: Pain level will decrease with appropriate interventions Outcome: Progressing   Problem: Health Behavior: Goal: Postive changes in compliance with treatment and prescription regimens will improve Outcome: Progressing   Problem: Education: Goal: Knowledge of General Education information will improve Description: Including pain rating scale, medication(s)/side effects and non-pharmacologic comfort measures Outcome: Progressing   Problem: Health Behavior/Discharge Planning: Goal: Ability to manage health-related needs will improve Outcome: Progressing   Problem: Clinical Measurements: Goal: Ability to maintain clinical measurements within normal limits will improve Outcome: Progressing Goal: Will remain free from infection Outcome: Progressing Goal: Diagnostic test results will improve Outcome: Progressing Goal: Respiratory complications will improve Outcome:  Progressing Goal: Cardiovascular complication will be avoided Outcome: Progressing   Problem: Activity: Goal: Risk for activity intolerance will decrease Outcome: Progressing   Problem: Nutrition: Goal: Adequate nutrition will be maintained Outcome: Progressing   Problem: Coping: Goal: Level of anxiety will decrease Outcome: Progressing   Problem: Elimination: Goal: Will not experience complications related to bowel motility Outcome: Progressing Goal: Will not experience complications related to urinary retention Outcome: Progressing   Problem: Safety: Goal: Ability to remain free from injury will improve Outcome: Progressing   Problem: Skin Integrity: Goal: Risk for impaired skin integrity will decrease Outcome: Progressing   Problem: Pain Managment: Goal: General experience of comfort will improve and/or be controlled Outcome: Not Progressing

## 2023-08-12 ENCOUNTER — Inpatient Hospital Stay (HOSPITAL_COMMUNITY): Payer: Self-pay

## 2023-08-12 LAB — BLOOD GAS, VENOUS
Acid-Base Excess: 1.3 mmol/L (ref 0.0–2.0)
Bicarbonate: 27.8 mmol/L (ref 20.0–28.0)
O2 Saturation: 66.6 %
Patient temperature: 37
pCO2, Ven: 54 mmHg (ref 44–60)
pH, Ven: 7.32 (ref 7.25–7.43)
pO2, Ven: 48 mmHg — ABNORMAL HIGH (ref 32–45)

## 2023-08-12 LAB — HIV-1/2 AB - DIFFERENTIATION
HIV 1 Ab: REACTIVE
HIV 2 Ab: NONREACTIVE

## 2023-08-12 LAB — T-HELPER CELLS (CD4) COUNT (NOT AT ARMC)
CD4 % Helper T Cell: 30 % — ABNORMAL LOW (ref 33–65)
CD4 T Cell Abs: 827 /uL (ref 400–1790)

## 2023-08-12 MED ORDER — BISACODYL 10 MG RE SUPP
10.0000 mg | Freq: Every day | RECTAL | Status: AC | PRN
  Administered 2023-08-12 – 2023-08-13 (×2): 10 mg via RECTAL
  Filled 2023-08-12 (×2): qty 1

## 2023-08-12 MED ORDER — IOHEXOL 350 MG/ML SOLN
75.0000 mL | Freq: Once | INTRAVENOUS | Status: AC | PRN
  Administered 2023-08-13: 75 mL via INTRAVENOUS

## 2023-08-12 MED ORDER — PROCHLORPERAZINE EDISYLATE 10 MG/2ML IJ SOLN
10.0000 mg | Freq: Four times a day (QID) | INTRAMUSCULAR | Status: DC | PRN
  Administered 2023-08-12: 10 mg via INTRAVENOUS
  Filled 2023-08-12: qty 2

## 2023-08-12 MED ORDER — SODIUM CHLORIDE 0.9 % IV SOLN
2.0000 g | Freq: Three times a day (TID) | INTRAVENOUS | Status: DC
  Administered 2023-08-12 – 2023-08-13 (×4): 2 g via INTRAVENOUS
  Filled 2023-08-12 (×4): qty 12.5

## 2023-08-12 MED ORDER — ALBUTEROL SULFATE (2.5 MG/3ML) 0.083% IN NEBU: 2.5000 mg | INHALATION_SOLUTION | RESPIRATORY_TRACT | Status: DC | PRN

## 2023-08-12 NOTE — Progress Notes (Addendum)
 1910H Patient refused to go to CT scan at this time, patient complains of nausea, PRN Zofran  given, this RN explained to the patient the reason why the test was ordered and its importance, encouraged the patient to do the test as soon as possible, patient still refused. Pending STAT CT endorsed to night shift RN.

## 2023-08-12 NOTE — Progress Notes (Signed)
 Patient ID: Martin [name], male   DOB: May 02, 1990, 33 y.o.   MRN: [id number] Subjective: Martin [name]  is a 33 y.o. male with history of sickle cell disease,  chronic pain syndrome, opiate dependency and tolerance, history of HIV, and history of PE on Xarelto.  Patient presents to emergency department with generalize body pain and lower extremity pain. Patient reports that he has been taking his oxycodone  at home without improvement. contacts.  Continues to report pain of 8/10 today slight improvement from yesterday and his chest, lower back and bilateral lower extremity.  Patient is afebrile, denies cough, nausea or vomiting.  Objective:  Vital signs in last 24 hours:  Vitals:   08/12/23 0429 08/12/23 0802 08/12/23 0816 08/12/23 0941  BP:    118/82  Pulse:    86  Resp: 12 11 11 16   Temp:    98.5 F (36.9 C)  TempSrc:    Oral  SpO2: 92% (!) 89% 90% (!) 89%  Weight:      Height:        Intake/Output from previous day:   Intake/Output Summary (Last 24 hours) at 08/12/2023 1111 Last data filed at 08/12/2023 0941 Gross per 24 hour  Intake 880 ml  Output 1950 ml  Net -1070 ml    Physical Exam: General: Alert, awake, oriented x3, in no acute distress.  HEENT: [place]/AT PEER[place], EOMI Neck: Trachea midline,  no masses, no thyromegal,y no JVD, no carotid bruit OROPHARYNX:  Moist, No exudate/ erythema/lesions.  Heart: Regular rate and rhythm, without murmurs, rubs, gallops, PMI non-displaced, no heaves or thrills on palpation.  Lungs: Clear to auscultation, no wheezing or rhonchi noted. No increased vocal fremitus resonant to percussion  Abdomen: Soft, nontender, nondistended, positive bowel sounds, no masses no hepatosplenomegaly noted..  Neuro: No focal neurological deficits noted cranial nerves II through XII grossly intact. DTRs 2+ bilaterally upper and lower extremities. Strength 5 out of 5 in bilateral upper and lower extremities. Musculoskeletal: Chest pain, low back pain,  generalized body ache. Psychiatric: Patient alert and oriented x3, good insight and cognition, good recent to remote recall. Lymph node survey: No cervical axillary or inguinal lymphadenopathy noted.  Lab Results:  Basic Metabolic Panel:    Component Value Date/Time   NA 137 08/10/2023 0430   NA 140 01/12/2023 1607   K 3.1 (L) 08/10/2023 0430   CL 106 08/10/2023 0430   CO2 24 08/10/2023 0430   BUN 8 08/10/2023 0430   BUN 8 01/12/2023 1607   CREATININE 0.62 08/10/2023 0430   GLUCOSE 112 (H) 08/10/2023 0430   CALCIUM  8.3 (L) 08/10/2023 0430   CBC:    Component Value Date/Time   WBC 10.2 08/10/2023 0430   HGB 8.3 (L) 08/10/2023 0430   HGB 7.4 (L) 01/12/2023 1607   HCT 23.9 (L) 08/10/2023 0430   HCT 21.7 (L) 01/12/2023 1607   PLT 247 08/10/2023 0430   PLT 321 01/12/2023 1607   MCV 101.7 (H) 08/10/2023 0430   MCV 94 01/12/2023 1607   NEUTROABS 3.8 08/10/2023 0430   NEUTROABS 3.8 01/12/2023 1607   LYMPHSABS 5.0 (H) 08/10/2023 0430   LYMPHSABS 5.6 (H) 01/12/2023 1607   MONOABS 1.1 (H) 08/10/2023 0430   EOSABS 0.3 08/10/2023 0430   EOSABS 0.2 01/12/2023 1607   BASOSABS 0.0 08/10/2023 0430   BASOSABS 0.1 01/12/2023 1607    Recent Results (from the past 240 hours)  MRSA Next Gen by PCR, Nasal     Status: None   Collection Time:  08/11/23 11:08 AM   Specimen: Nasal Mucosa; Nasal Swab  Result Value Ref Range Status   MRSA by PCR Next Gen NOT DETECTED NOT DETECTED Final    Comment: (NOTE) The GeneXpert MRSA Assay (FDA approved for NASAL specimens only), is one component of a comprehensive MRSA colonization surveillance program. It is not intended to diagnose MRSA infection nor to guide or monitor treatment for MRSA infections. Test performance is not FDA approved in patients less than 36 years old. Performed at [facility] [address] [place], [place] [address]     Studies/Results: DG Chest 2 View Result Date: 08/10/2023 CLINICAL DATA:   Decreased lung sounds EXAM: CHEST - 2 VIEW COMPARISON:  Chest radiograph June 19, 2023 FINDINGS: Port-A-Cath tip projects over the superior vena cava. Monitoring leads overlie the patient. Stable cardiac and mediastinal contours. Interval development of bilateral predominantly mid lung peripheral areas of consolidation. This is upon a background of chronic interstitial opacities. No definite pleural effusion or pneumothorax. Osseous structures unremarkable. IMPRESSION: Interval development of bilateral predominantly mid lung peripheral areas of consolidation. Findings are concerning for multifocal pneumonia. Electronically Signed   By: Jone Neither M.D.   On: 08/10/2023 14:48    Medications: Scheduled Meds:  apixaban   5 mg Oral BID   azithromycin   500 mg Oral QHS   folic acid   1 mg Oral Daily   HYDROmorphone    Intravenous Q4H   hydroxyurea   2,000 mg Oral QHS   potassium chloride   40 mEq Oral Daily   senna-docusate  1 tablet Oral BID   Continuous Infusions:  ceFEPime  (MAXIPIME ) IV     PRN Meds:.albuterol , bisacodyl , cyclobenzaprine , diphenhydrAMINE , gabapentin , lip balm, naloxone  **AND** sodium chloride  flush, ondansetron  (ZOFRAN ) IV, mouth rinse, oxyCODONE -acetaminophen  **AND** oxyCODONE , pantoprazole , polyethylene glycol  Consultants: None  Procedures: None  Antibiotics: Azithromycin  Cefepime   Assessment/Plan: Active Problems:   Generalized anxiety disorder   GERD (gastroesophageal reflux disease)   Hb-SS disease without crisis (HCC)   Sickle cell anemia with pain (HCC)   Hb Sickle Cell Disease with Pain crisis: Continue IVF 0.45% Saline @KVO  mls/hour, continue weight based Dilaudid  PCA, continue oral home pain medications as ordered. Monitor vitals very closely, Re-evaluate pain scale regularly, 2 L of Oxygen  by [place]. Patient encouraged to ambulate on the hallway today.  Leukocytosis: Elevated due to pneumonia infection, patient is currently on antibiotics. Anemia of Chronic  Disease: Hemoglobin below patient's baseline however no transfusion necessary at this time.  Will continue to monitor.  Daily CBC in place. Chronic pain Syndrome: Continue her home pain medication.   Code Status: Full Code Family Communication: N/A Disposition Plan: Not yet ready for discharge  Lorel Roes NP  If 7PM-7AM, please contact night-coverage.  08/12/2023, 11:11 AM  LOS: 2 days

## 2023-08-12 NOTE — Plan of Care (Signed)
  Problem: Self-Care: Goal: Ability to incorporate actions that prevent/reduce pain crisis will improve Outcome: Progressing   Problem: Respiratory: Goal: Pulmonary complications will be avoided or minimized Outcome: Progressing   Problem: Respiratory: Goal: Acute Chest Syndrome will be identified early to prevent complications Outcome: Progressing   Problem: Activity: Goal: Risk for activity intolerance will decrease Outcome: Progressing   Problem: Nutrition: Goal: Adequate nutrition will be maintained Outcome: Progressing   Problem: Pain Managment: Goal: General experience of comfort will improve and/or be controlled Outcome: Progressing   Problem: Safety: Goal: Ability to remain free from injury will improve Outcome: Progressing

## 2023-08-12 NOTE — Plan of Care (Signed)

## 2023-08-13 ENCOUNTER — Inpatient Hospital Stay (HOSPITAL_COMMUNITY): Payer: Self-pay

## 2023-08-13 ENCOUNTER — Other Ambulatory Visit (HOSPITAL_COMMUNITY): Payer: Self-pay

## 2023-08-13 DIAGNOSIS — D5700 Hb-SS disease with crisis, unspecified: Secondary | ICD-10-CM

## 2023-08-13 DIAGNOSIS — D571 Sickle-cell disease without crisis: Secondary | ICD-10-CM

## 2023-08-13 DIAGNOSIS — F411 Generalized anxiety disorder: Secondary | ICD-10-CM

## 2023-08-13 DIAGNOSIS — K2100 Gastro-esophageal reflux disease with esophagitis, without bleeding: Secondary | ICD-10-CM

## 2023-08-13 MED ORDER — GABAPENTIN 300 MG PO CAPS
300.0000 mg | ORAL_CAPSULE | Freq: Every evening | ORAL | 3 refills | Status: AC | PRN
  Filled 2023-08-13: qty 30, 30d supply, fill #0

## 2023-08-13 MED ORDER — TRIUMEQ 600-50-300 MG PO TABS
1.0000 | ORAL_TABLET | Freq: Every day | ORAL | 3 refills | Status: DC
  Filled 2023-08-13: qty 30, 30d supply, fill #0

## 2023-08-13 MED ORDER — ALBUTEROL SULFATE HFA 108 (90 BASE) MCG/ACT IN AERS
2.0000 | INHALATION_SPRAY | Freq: Four times a day (QID) | RESPIRATORY_TRACT | 2 refills | Status: AC | PRN
  Filled 2023-08-13: qty 6.7, 25d supply, fill #0

## 2023-08-13 MED ORDER — HYDROXYUREA 500 MG PO CAPS
2000.0000 mg | ORAL_CAPSULE | Freq: Every day | ORAL | 11 refills | Status: AC
  Filled 2023-08-13 – 2024-01-15 (×2): qty 120, 30d supply, fill #0

## 2023-08-13 MED ORDER — CEPHALEXIN 500 MG PO CAPS
500.0000 mg | ORAL_CAPSULE | Freq: Three times a day (TID) | ORAL | 0 refills | Status: AC
Start: 2023-08-13 — End: 2023-08-18
  Filled 2023-08-13: qty 15, 5d supply, fill #0

## 2023-08-13 MED ORDER — POTASSIUM CHLORIDE CRYS ER 20 MEQ PO TBCR
40.0000 meq | EXTENDED_RELEASE_TABLET | Freq: Once | ORAL | Status: AC
  Administered 2023-08-13: 40 meq via ORAL
  Filled 2023-08-13: qty 2

## 2023-08-13 MED ORDER — FOLIC ACID 400 MCG PO TABS
400.0000 ug | ORAL_TABLET | Freq: Every day | ORAL | 5 refills | Status: DC
  Filled 2023-08-13: qty 30, 30d supply, fill #0

## 2023-08-13 MED ORDER — GUAIFENESIN ER 600 MG PO TB12
600.0000 mg | ORAL_TABLET | Freq: Two times a day (BID) | ORAL | 2 refills | Status: DC
  Filled 2023-08-13: qty 17, 9d supply, fill #0

## 2023-08-13 MED ORDER — DIPHENHYDRAMINE HCL 25 MG PO TABS
25.0000 mg | ORAL_TABLET | ORAL | 1 refills | Status: AC | PRN
  Filled 2023-08-13: qty 30, 5d supply, fill #0

## 2023-08-13 MED ORDER — APIXABAN 5 MG PO TABS
5.0000 mg | ORAL_TABLET | Freq: Two times a day (BID) | ORAL | 11 refills | Status: DC
  Filled 2023-08-13: qty 60, 30d supply, fill #0

## 2023-08-13 MED ORDER — DEFERIPRONE 500 MG PO TABS
1.0000 | ORAL_TABLET | Freq: Every day | ORAL | 1 refills | Status: DC
  Filled 2023-08-13: qty 30, 30d supply, fill #0

## 2023-08-13 MED ORDER — SENNOSIDES-DOCUSATE SODIUM 8.6-50 MG PO TABS
2.0000 | ORAL_TABLET | Freq: Every day | ORAL | 3 refills | Status: AC
  Filled 2023-08-13: qty 60, 30d supply, fill #0

## 2023-08-13 MED ORDER — AZITHROMYCIN 500 MG PO TABS
500.0000 mg | ORAL_TABLET | Freq: Every day | ORAL | 0 refills | Status: AC
  Filled 2023-08-13: qty 3, 3d supply, fill #0

## 2023-08-13 MED ORDER — PANTOPRAZOLE SODIUM 40 MG PO TBEC
40.0000 mg | DELAYED_RELEASE_TABLET | Freq: Every day | ORAL | 2 refills | Status: AC | PRN
  Filled 2023-08-13: qty 30, 30d supply, fill #0

## 2023-08-13 NOTE — Plan of Care (Signed)
  Problem: Education: Goal: Knowledge of vaso-occlusive preventative measures will improve Outcome: Progressing Goal: Awareness of infection prevention will improve Outcome: Progressing Goal: Awareness of signs and symptoms of anemia will improve Outcome: Progressing Goal: Long-term complications will improve Outcome: Progressing   Problem: Self-Care: Goal: Ability to incorporate actions that prevent/reduce pain crisis will improve Outcome: Progressing   Problem: Bowel/Gastric: Goal: Gut motility will be maintained Outcome: Progressing   Problem: Respiratory: Goal: Pulmonary complications will be avoided or minimized Outcome: Progressing   Problem: Fluid Volume: Goal: Ability to maintain a balanced intake and output will improve Outcome: Progressing   Problem: Sensory: Goal: Pain level will decrease with appropriate interventions Outcome: Progressing   Problem: Health Behavior: Goal: Postive changes in compliance with treatment and prescription regimens will improve Outcome: Progressing   Problem: Education: Goal: Knowledge of General Education information will improve Description: Including pain rating scale, medication(s)/side effects and non-pharmacologic comfort measures Outcome: Progressing   Problem: Clinical Measurements: Goal: Diagnostic test results will improve Outcome: Progressing   Problem: Activity: Goal: Risk for activity intolerance will decrease Outcome: Progressing   Problem: Nutrition: Goal: Adequate nutrition will be maintained Outcome: Progressing

## 2023-08-13 NOTE — Progress Notes (Signed)
 Martin Lesser, NP at bedside to talk to patient. He is now agreeable to get CT completed before 8 am. Contacted CT tech to notify that patient can't be transported when they have availability.

## 2023-08-13 NOTE — Discharge Summary (Signed)
 Martin [name], is a 33 y.o. male  DOB 10-09-1990  MRN [id number].  Admission date:  08/10/2023  Admitting Physician  Olugbemiga E Jegede, MD  Discharge Date:  08/13/2023   Primary MD  [name], [name], [name]  Recommendations for primary care physician for things to follow:  1)You are taking Eliquis /apixaban  which is a blood thinner show please , Avoid ibuprofen /Advil /Aleve/Motrin Martin [name] Powders/Naproxen/BC powders/Meloxicam /Diclofenac/Indomethacin and other Nonsteroidal anti-inflammatory medications as these will make you more likely to bleed and can cause stomach ulcers, can also cause Kidney problems.   2)Watch for bleeding while on Blood Thinners--watch for blood in your stool which can make your stool black, maroon, mahogany or red---, blood in your urine which can make your urine pink or red, nosebleeds , also watch for possible bruising -You are taking Apixaban /Eliquis --- which is a blood thinner--- be careful to avoid injury or falls  3)You need oxygen  at home at 3 L via nasal cannula continuously while awake and while asleep--- smoking or having open fires around oxygen  can cause fire, significant injury and death  4) please follow-up with your sickle cell provider next week in the clinic for recheck and reevaluation and repeat CBC blood test  5) please establish care with HIV/infectious disease provider in [place] as soon as possible to avoid lapses in your treatment  Admission Diagnosis  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]   Discharge Diagnosis  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]    Active Problems:   Generalized anxiety disorder   GERD (gastroesophageal reflux disease)   Hb-SS disease without crisis (HCC)   Sickle cell anemia with pain (HCC)      Past Medical History:  Diagnosis Date   Anxiety    HIV (human immunodeficiency virus infection) (HCC)    Proteinuria     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HCC)    Vitamin D  deficiency 10/2018    Past Surgical History:  Procedure Laterality Date   IR IMAGING GUIDED PORT INSERTION  08/29/2019   IR REMOVAL TUN ACCESS W/ PORT W/O FL MOD SED  08/30/2020   TEE WITHOUT CARDIOVERSION N/A 08/30/2020   Procedure: TRANSESOPHAGEAL ECHOCARDIOGRAM (TEE);  Surgeon: Hazle Lites, MD;  Location: [facility] ENDOSCOPY;  Service: Cardiovascular;  Laterality: N/A;       HPI  from the history and physical done on the day of admission:   Martin [name]  is a 33 y.o. male with history of sickle cell disease, Martin [name] is a 33 year old-male with a medical history significant for sickle cell disease, chronic pain syndrome, opiate dependency and tolerance, history of HIV, and history of PE on Xarelto.  Patient presents to emergency department with generalize body pain and lower extremity pain. Patient reports that he has been taking his oxycodone  at home without improvement. He was seen at the sickle cell pain clinic yesterday for the same symptoms. Pain was controlled and patient was discharged home, however the pain returned. He is on 3l oxygen  at home. Denies cough, fever, chest pain, diarrhea /vomiting. He has had  no sick contacts.    ED course: BP 107/67  Pulse 75  Temp 97.9 F (36.6 C) (Oral)  Resp 13  SpO2 (!) 88% , HGB 8.3 g/dl, Patient treated with dilaudid  and IVF with no improvement to pain symptoms. Admitted inpatient for ongoing sickle cell pain management.      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 Shereen Dike appreciated -Encouraged to be compliant with his HIV medications -Apparently lost contact with his ID physician in [place]  and Dr. Bevin Bucks -Patient is strongly encouraged to establish care with new ID physician here in [place] to facilitate management of his HIV  3) chronic anemia of sickle cell disease:  - Hgb stable above 8 -No transfusion required - Compliance with hydroxyurea  advised  4)Presumed community-acquired pneumonia--POA - Admission chest x-ray on 08/10/2023 was suggestive of CAP - Patient was treated with IV cefepime  and azithromycin  - Clinically much improved - Oxygen  requirement is back to baseline at 3 L per nasal cannula - CTA chest on 08/13/2023 without acute PE, evidence of chronic pulmonary infarctions - Discharged on Keflex  and azithromycin , along with bronchodilators and mucolytics  5) acute on chronic hypoxic respiratory failure--please see #4 above -CTA chest as above #4 -- Overall much improved --At rest O2 sats on 3 L of oxygen  is 92 to 94% -O2 sats on PTA 3 L of oxygen  is 89 to 90% with an post ambulation - During this admission patient required up to 5 L of oxygen  currently weaned down to 3 L  6)Abnormal CTA Chest Findings---2 subjacent nodules within the left upper lobe which appear new from previous exam measuring up to 4 mm. - Patient is a non-smoker... Denies current or prior tobacco use - No family history of lung cancer in first-degree relatives - Expectant management  7)Chronic pain Syndrome: Pain control improved significantly, please see #1 above  --continue home pain medication.  8)Generalized weakness and ambulatory dysfunction--patient requesting rolling walker  - TOC/social worker provided rolling walker for patient   9)History of prior VTE/PE--- CTA chest on 08/13/2023 without acute PE - Continue PTA Eliquis  - No bleeding concerns at this time  Discharge Condition: stable, oxygen   requirement is back to baseline  Follow UP   Follow-up Information     Lorel Roes, NP. Schedule an appointment as soon as possible for a visit in 5 day(s).   Specialties: Nurse Practitioner, Internal Medicine Contact information: [address] [place] [address] [phone]         Jamesetta Mcbride, MD. Schedule an appointment as soon as possible for a visit in 1 week(s).   Specialty: Infectious Diseases Why: HIV treatment Contact information: [address] [place] [address] [phone]                 Diet and Activity recommendation:  As advised  Discharge Instructions    Discharge Instructions     Call MD for:  difficulty breathing, headache or visual disturbances   Complete by: As directed  Call MD for:  persistant dizziness or light-headedness   Complete by: As directed    Call MD for:  persistant nausea and vomiting   Complete by: As directed    Call MD for:  temperature >100.4   Complete by: As directed    Diet general   Complete by: As directed    Discharge instructions   Complete by: As directed    1)You are taking Eliquis /apixaban  which is a blood thinner show please , Avoid ibuprofen /Advil /Aleve/Motrin /Goody Powders/Naproxen/BC powders/Meloxicam /Diclofenac/Indomethacin and other Nonsteroidal anti-inflammatory medications as these will make you more likely to bleed and can cause stomach ulcers, can also cause Kidney problems.   2)Watch for bleeding while on Blood Thinners--watch for blood in your stool which can make your stool black, maroon, mahogany or red---, blood in your urine which can make your urine pink or red, nosebleeds , also watch for possible bruising -You are taking Apixaban /Eliquis --- which is a blood thinner--- be careful to avoid injury or falls  3)You need oxygen  at home at 3 L via nasal cannula continuously while awake and while asleep--- smoking or having open fires around oxygen  can cause fire,  significant injury and death  4) please follow-up with your sickle cell provider next week in the clinic for recheck and reevaluation and repeat CBC blood test  5) please establish care with HIV/infectious disease provider in [place] as soon as possible to avoid lapses in your treatment   Increase activity slowly   Complete by: As directed          Discharge Medications     Allergies as of 08/13/2023       Reactions   Ketorolac  Tromethamine  Swelling, Other (See Comments)   Patient reports facial edema and left arm edema after administration.   Tape Rash, Other (See Comments)   PLEASE DO NOT USE THE CLEAR, THICK, "PLASTIC" TAPE- only paper tape is tolerated    Wound Dressing Adhesive Rash        Medication List     STOP taking these medications    ergocalciferol  1.25 MG (50000 UT) capsule Commonly known as: VITAMIN D2       TAKE these medications    albuterol  108 (90 Base) MCG/ACT inhaler Commonly known as: VENTOLIN  HFA Inhale 2 puffs into the lungs every 6 (six) hours as needed for wheezing or shortness of breath.   apixaban  5 MG Tabs tablet Commonly known as: ELIQUIS  Take 1 tablet (5 mg total) by mouth 2 (two) times daily.   azithromycin  500 MG tablet Commonly known as: ZITHROMAX  Take 1 tablet (500 mg total) by mouth daily for 3 days.   cephALEXin  500 MG capsule Commonly known as: KEFLEX  Take 1 capsule (500 mg total) by mouth 3 (three) times daily for 5 days.   cyclobenzaprine  10 MG tablet Commonly known as: FLEXERIL  Take 10 mg by mouth 2 (two) times daily as needed for muscle spasms.   Deferiprone  500 MG Tabs Commonly known as: Ferriprox  Take 1 tablet by mouth daily.   diphenhydrAMINE  25 MG tablet Commonly known as: BENADRYL  Take 1 tablet (25 mg total) by mouth every 4 (four) hours as needed for itching.   folic acid  400 MCG tablet Commonly known as: FOLVITE  Take 1 tablet (400 mcg total) by mouth daily.   gabapentin  300 MG capsule Commonly  known as: NEURONTIN  Take 1 capsule (300 mg total) by mouth at bedtime as needed (nerve pain).   guaiFENesin  600 MG 12 hr tablet Commonly known as: Mucinex  Take  1 tablet (600 mg total) by mouth 2 (two) times daily.   hydroxyurea  500 MG capsule Commonly known as: HYDREA  Take 4 capsules (2,000 mg total) by mouth at bedtime.   Narcan  4 MG/0.1ML Liqd nasal spray kit Generic drug: naloxone  Place 1 spray into the nose once as needed (as directed).   oxyCODONE -acetaminophen  10-325 MG tablet Commonly known as: PERCOCET Take 1 tablet by mouth every 4 (four) hours as needed for pain. What changed: when to take this   pantoprazole  40 MG tablet Commonly known as: PROTONIX  Take 1 tablet (40 mg total) by mouth daily as needed (acid reflux).   senna-docusate 8.6-50 MG tablet Commonly known as: Senokot-S Take 2 tablets by mouth at bedtime.   triamcinolone  cream 0.1 % Commonly known as: KENALOG  Apply 1 Application topically 2 (two) times daily. What changed:  when to take this reasons to take this   Triumeq  600-50-300 MG tablet Generic drug: abacavir -dolutegravir -lamiVUDine  Take 1 tablet by mouth daily.   Vitamin D3 125 MCG (5000 UT) Caps Take 5,000 Units by mouth daily.               Durable Medical Equipment  (From admission, onward)           Start     Ordered   08/13/23 1203  For home use only DME Walker rolling  Once       Question Answer Comment  Walker: With 5 Inch Wheels   Patient needs a walker to treat with the following condition Acute on chronic hypoxic respiratory failure (HCC)      08/13/23 1203            Major procedures and Radiology Reports - PLEASE review detailed and final reports for all details, in brief -   CT Angio Chest Pulmonary Embolism (PE) W or WO Contrast Result Date: 08/13/2023 CLINICAL DATA:  Sickle cell crisis. History of HIV. Concern for acute pulmonary embolus. Chest pain and elevated white count. EXAM: CT ANGIOGRAPHY CHEST  WITH CONTRAST TECHNIQUE: Multidetector CT imaging of the chest was performed using the standard protocol during bolus administration of intravenous contrast. Multiplanar CT image reconstructions and MIPs were obtained to evaluate the vascular anatomy. RADIATION DOSE REDUCTION: This exam was performed according to the departmental dose-optimization program which includes automated exposure control, adjustment of the mA and/or kV according to patient size and/or use of iterative reconstruction technique. CONTRAST:  75mL OMNIPAQUE  IOHEXOL  350 MG/ML SOLN COMPARISON:  09/15/2022 FINDINGS: Cardiovascular: Suboptimal opacification of the pulmonary arteries as well as respiratory motion artifact diminishes exam detail. Within these limitations, there are no signs to suggest a clinically significant acute pulmonary embolus. Mild cardiac enlargement.  No pericardial effusion. Mediastinum/Nodes: Thyroid gland, trachea and esophagus appear normal. No enlarged mediastinal or hilar lymph nodes. Lungs/Pleura: Extensive pulmonary parenchymal scarring, architectural distortion with bilateral multifocal areas of ground-glass attenuation with a lower lung zone predominance. Subsegmental atelectasis identified within the left base. No airspace consolidation or pneumothorax. There are 2 subjacent nodules within the left upper lobe which appear new from previous exam measuring up to 4 mm, image 49/7. Upper Abdomen: No acute abnormality.  Atrophic calcified spleen. Musculoskeletal: Bony stigmata of sickle cell disease. No acute or suspicious osseous findings Review of the MIP images confirms the above findings. IMPRESSION: 1. Suboptimal opacification of the pulmonary arteries as well as respiratory motion artifact diminishes exam detail. Within these limitations, there are no signs to suggest a clinically significant acute pulmonary embolus. 2. Extensive pulmonary parenchymal scarring, architectural  distortion with bilateral multifocal  areas of ground-glass attenuation with a lower lung zone predominance. 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 3. There are 2 subjacent nodules within the left upper lobe which appear new from previous exam measuring up to 4 mm. If the patient is at high risk for bronchogenic carcinoma, follow-up chest CT at 1year is recommended. If the patient is at low risk, no follow-up is needed. This recommendation follows the consensus statement: Guidelines for Management of Small Pulmonary Nodules Detected on CT Scans: A Statement from the Fleischner Society as published in Radiology 2005; 237:395-400. Electronically Signed   By: Kimberley Penman M.D.   On: 08/13/2023 07:24   DG Chest 2 View Result Date: 08/10/2023 CLINICAL DATA:  Decreased lung sounds EXAM: CHEST - 2 VIEW COMPARISON:  Chest radiograph June 19, 2023 FINDINGS: Port-A-Cath tip projects over the superior vena cava. Monitoring leads overlie the patient. Stable cardiac and mediastinal contours. Interval development of bilateral predominantly mid lung peripheral areas of consolidation. This is upon a background of chronic interstitial opacities. No definite pleural effusion or pneumothorax. Osseous structures unremarkable. IMPRESSION: Interval development of bilateral predominantly mid lung peripheral areas of consolidation. Findings are concerning for multifocal pneumonia. Electronically Signed   By: Jone Neither M.D.   On: 08/10/2023 14:48    Micro Results   Recent Results (from the past 240 hours)  MRSA Next Gen by PCR, Nasal     Status: None   Collection Time: 08/11/23 11:08 AM   Specimen: Nasal Mucosa; Nasal Swab  Result Value Ref Range Status   MRSA by PCR Next Gen NOT DETECTED NOT DETECTED Final    Comment: (NOTE) The GeneXpert MRSA Assay (FDA approved for NASAL specimens only), is one component of a comprehensive MRSA colonization surveillance program. It is not intended to diagnose MRSA infection nor to  guide or monitor treatment for MRSA infections. Test performance is not FDA approved in patients less than 10 years old. Performed at [facility], [address] [place], [place] [address]     Today   Subjective    Rayshun Meaders today has no new concerns --At rest O2 sats on 3 L of oxygen  is 92 to 94% -O2 sats on PTA 3 L of oxygen  is 89 to 90% with an post ambulation - During this admission patient required up to 5 L of oxygen  currently weaned down to 3 L - Patient complaint generalized weakness requesting walker   Patient has been seen and examined prior to discharge   Objective   Blood pressure 108/77, pulse 80, temperature 98.4 F (36.9 C), temperature source Oral, resp. rate (!) 8, height 6\' 3"  (1.905 m), weight 59 kg, SpO2 (!) 88%.   Intake/Output Summary (Last 24 hours) at 08/13/2023 1244 Last data filed at 08/13/2023 0605 Gross per 24 hour  Intake 511.5 ml  Output 2550 ml  Net -2038.5 ml    Exam Gen:- Awake Alert, no acute distress  HEENT:- [place].AT, No sclera icterus Nose- [place] 3L/min Neck-Supple Neck,No JVD,.  Lungs-overall fair air movement, no wheezing ,., no rhonchi  CV- S1, S2 normal, regular, right-sided Port-A-Cath in situ Abd-  +ve B.Sounds, Abd Soft, No tenderness,    Extremity/Skin:- No  edema,   good pulses Psych-affect is appropriate, oriented x3 Neuro-no new focal deficits, no tremors    Data Review   CBC w Diff:  Lab Results  Component Value Date   WBC 10.2 08/10/2023   HGB 8.3 (L)  08/10/2023   HGB 7.4 (L) 01/12/2023   HCT 23.9 (L) 08/10/2023   HCT 21.7 (L) 01/12/2023   PLT 247 08/10/2023   PLT 321 01/12/2023   LYMPHOPCT 49 08/10/2023   BANDSPCT 0 01/16/2023   MONOPCT 11 08/10/2023   EOSPCT 3 08/10/2023   BASOPCT 0 08/10/2023   CMP:  Lab Results  Component Value Date   NA 137 08/10/2023   NA 140 01/12/2023   K 3.1 (L) 08/10/2023   CL 106 08/10/2023   CO2 24 08/10/2023   BUN 8 08/10/2023   BUN 8  01/12/2023   CREATININE 0.62 08/10/2023   PROT 7.2 08/10/2023   PROT 7.9 01/12/2023   ALBUMIN 3.7 08/10/2023   ALBUMIN 4.1 01/12/2023   BILITOT 7.6 (H) 08/10/2023   BILITOT 2.6 (H) 01/12/2023   ALKPHOS 57 08/10/2023   AST 22 08/10/2023   ALT 15 08/10/2023   Total Discharge time is about 33 minutes  Colin Dawley M.D on 08/13/2023 at 12:44 PM  Go to [email or web address] -  for contact info  [facility] - Office  [phone]

## 2023-08-13 NOTE — Plan of Care (Signed)
  Problem: Education: Goal: Knowledge of vaso-occlusive preventative measures will improve Outcome: Adequate for Discharge Goal: Awareness of infection prevention will improve Outcome: Adequate for Discharge Goal: Awareness of signs and symptoms of anemia will improve Outcome: Adequate for Discharge Goal: Long-term complications will improve Outcome: Adequate for Discharge   Problem: Self-Care: Goal: Ability to incorporate actions that prevent/reduce pain crisis will improve Outcome: Adequate for Discharge   Problem: Bowel/Gastric: Goal: Gut motility will be maintained Outcome: Adequate for Discharge   Problem: Tissue Perfusion: Goal: Complications related to inadequate tissue perfusion will be avoided or minimized Outcome: Adequate for Discharge   Problem: Respiratory: Goal: Pulmonary complications will be avoided or minimized Outcome: Adequate for Discharge Goal: Acute Chest Syndrome will be identified early to prevent complications Outcome: Adequate for Discharge   Problem: Fluid Volume: Goal: Ability to maintain a balanced intake and output will improve Outcome: Adequate for Discharge   Problem: Sensory: Goal: Pain level will decrease with appropriate interventions Outcome: Adequate for Discharge   Problem: Health Behavior: Goal: Postive changes in compliance with treatment and prescription regimens will improve Outcome: Adequate for Discharge   Problem: Education: Goal: Knowledge of General Education information will improve Description: Including pain rating scale, medication(s)/side effects and non-pharmacologic comfort measures Outcome: Adequate for Discharge   Problem: Health Behavior/Discharge Planning: Goal: Ability to manage health-related needs will improve Outcome: Adequate for Discharge   Problem: Clinical Measurements: Goal: Ability to maintain clinical measurements within normal limits will improve Outcome: Adequate for Discharge Goal: Will remain  free from infection Outcome: Adequate for Discharge Goal: Diagnostic test results will improve Outcome: Adequate for Discharge Goal: Respiratory complications will improve Outcome: Adequate for Discharge Goal: Cardiovascular complication will be avoided Outcome: Adequate for Discharge   Problem: Activity: Goal: Risk for activity intolerance will decrease Outcome: Adequate for Discharge   Problem: Nutrition: Goal: Adequate nutrition will be maintained Outcome: Adequate for Discharge   Problem: Coping: Goal: Level of anxiety will decrease Outcome: Adequate for Discharge   Problem: Elimination: Goal: Will not experience complications related to bowel motility Outcome: Adequate for Discharge Goal: Will not experience complications related to urinary retention Outcome: Adequate for Discharge   Problem: Pain Managment: Goal: General experience of comfort will improve and/or be controlled Outcome: Adequate for Discharge   Problem: Safety: Goal: Ability to remain free from injury will improve Outcome: Adequate for Discharge   Problem: Skin Integrity: Goal: Risk for impaired skin integrity will decrease Outcome: Adequate for Discharge

## 2023-08-13 NOTE — TOC Initial Note (Signed)
 Transition of Care [facility]) - Initial/Assessment Note    Patient Details  Name: Martin [name] MRN: [id number] Date of Birth: 04/13/1991  Transition of Care [facility]) CM/SW Contact:    Loreda Rodriguez, RN Phone Number:[phone]  08/13/2023, 1:31 PM  Clinical Narrative:                 TOC following patient with high risk for readmission. Patient is from home where he normally functions independently. Patient states that he does have PCP and follows up on a regular basis. Patient states that he does have access to affordable medications and has [place]  Medicaid. DME recommendation is for rolling walker. Unit director has provided free rolling walker. Currently there are no other TOC needs. TOC signing off.   Expected Discharge Plan: Home/Self Care Barriers to Discharge: No Barriers Identified   Patient Goals and CMS Choice Patient states their goals for this hospitalization and ongoing recovery are:: to feel better to go home   Choice offered to / list presented to : NA [place] ownership interest in [facility].provided to::  (n/a)    Expected Discharge Plan and Services In-house Referral: NA Discharge Planning Services: CM Consult   Living arrangements for the past 2 months: Single Family Home Expected Discharge Date: 08/13/23               DME Arranged: Otho Blitz rolling DME Agency: NA (Charity from Water quality scientist)       HH Arranged: NA HH Agency: NA        Prior Living Arrangements/Services Living arrangements for the past 2 months: Single Family Home Lives with:: Self Patient language and need for interpreter reviewed:: Yes Do you feel safe going back to the place where you live?: Yes      Need for Family Participation in Patient Care: No (Comment) Care giver support system in place?: No (comment) Current home services: DME Criminal Activity/Legal Involvement Pertinent to Current Situation/Hospitalization: No - Comment as needed  Activities of Daily  Living   ADL Screening (condition at time of admission) Independently performs ADLs?: Yes (appropriate for developmental age) Is the patient deaf or have difficulty hearing?: No Does the patient have difficulty seeing, even when wearing glasses/contacts?: No Does the patient have difficulty concentrating, remembering, or making decisions?: No  Permission Sought/Granted Permission sought to share information with : Family Supports Permission granted to share information with : No              Emotional Assessment Appearance:: Appears stated age Attitude/Demeanor/Rapport: Gracious Affect (typically observed): Accepting, Pleasant Orientation: : Oriented to Self, Oriented to Place, Oriented to  Time, Oriented to Situation Alcohol  / Substance Use: Not Applicable Psych Involvement: No (comment)  Admission diagnosis:  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] Patient Active Problem List   Diagnosis Date Noted   Chronic respiratory failure (HCC) 08/04/2023   Iron overload, 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   Influenza A with pneumonia 06/20/2023   Acute hypoxemic respiratory failure (HCC) 06/20/2023   AKI (acute kidney injury) (HCC) 06/20/2023   Influenza A with respiratory manifestations 06/20/2023   Acute on chronic anemia 03/16/2023   History of HIV infection (HCC) 03/16/2023   Hypokalemia 03/16/2023   Anemia of chronic disease 07/13/2022   Hyperbilirubinemia 07/13/2022   Tinea capitis 07/29/2021   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HCC) 06/24/2021   Bacteremia due to Enterococcus 08/29/2020   Acute chest syndrome (HCC) 08/26/2020   Hypoxia 07/09/2020   COVID-19 05/13/2020   Sickle cell anemia with pain (  HCC) 03/18/2020   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   Abnormal penile discharge, without blood 02/22/2020   Leukocytosis 01/02/2020   Anxiety 11/27/2019   Proteinuria 11/27/2019   Acute on chronic respiratory failure with hypoxia (HCC) 11/16/2019   Chronic, continuous use of opioids 08/15/2019    Seasonal allergies 08/15/2019   Chest congestion    Sickle cell anemia with crisis (HCC) 08/04/2019   Chronic pain syndrome 08/04/2019   History of pulmonary embolus (PE) 08/04/2019   Single subsegmental pulmonary embolism without acute cor pulmonale (HCC) 02/10/2019   Acute chest syndrome due to sickle cell crisis (HCC) 02/10/2019   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) 03/09/2018   Bone pain 03/07/2018   Hip pain 03/07/2018   Acute bronchitis due to Streptococcus 02/21/2018   Heart murmur 02/03/2017   Sickle cell disease with crisis (HCC) 02/02/2017   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 [place] [address] Phone: [phone] Fax: [phone]     Social Drivers of Health (SDOH) Social History: SDOH Screenings   Food Insecurity: No Food Insecurity (08/10/2023)  Recent Concern: Food Insecurity - Food Insecurity Present (08/03/2023)  Housing: Low Risk  (08/10/2023)  Transportation Needs: Unmet Transportation Needs (08/10/2023)  Utilities: At Risk (08/10/2023)  Depression (PHQ2-9): Medium Risk (08/04/2023)  Financial Resource Strain: Medium Risk (08/03/2023)  Physical Activity: Sufficiently Active (08/03/2023)  Social Connections: Moderately Isolated (08/03/2023)  Stress: No Stress Concern Present (08/03/2023)  Tobacco Use: Low Risk  (08/10/2023)   SDOH Interventions:     Readmission Risk Interventions    08/12/2023    4:01 PM 03/16/2023    9:37 AM 09/25/2022    3:29 PM  Readmission Risk Prevention Plan   Transportation Screening Complete Complete Complete  Medication Review Oceanographer) Complete Complete Complete  PCP or Specialist appointment within 3-5 days of discharge Complete Complete Complete  HRI or Home Care Consult Complete Complete Complete  SW Recovery Care/Counseling Consult Complete Complete Complete  Palliative Care Screening Not Applicable Not Applicable Not Applicable  Skilled Nursing Facility Not Applicable Not Applicable Not Applicable

## 2023-08-13 NOTE — TOC Transition Note (Signed)
 Transition of Care [facility]) - Discharge Note   Patient Details  Name: Martin [name] MRN: [id number] Date of Birth: [birth year]  [facility]) CM/SW Contact:  Loreda Rodriguez, RN Phone Number:[phone]  08/13/2023, 1:46 PM   Clinical Narrative:    Patient discharging home no TOC needs noted.      Barriers to Discharge: No Barriers Identified   Patient Goals and CMS Choice Patient states their goals for this hospitalization and ongoing recovery are:: to feel better to go home   Choice offered to / list presented to : NA [place] ownership interest in [facility].provided to::  (n/a)    Discharge Placement                       Discharge Plan and Services Additional resources added to the After Visit Summary for   In-house Referral: NA Discharge Planning Services: CM Consult            DME Arranged: Otho Blitz rolling DME Agency: NA (Charity from Water quality scientist)       HH Arranged: NA HH Agency: NA        Social Drivers of Health (SDOH) Interventions SDOH Screenings   Food Insecurity: No Food Insecurity (08/10/2023)  Recent Concern: Food Insecurity - Food Insecurity Present (08/03/2023)  Housing: Low Risk  (08/10/2023)  Transportation Needs: Unmet Transportation Needs (08/10/2023)  Utilities: At Risk (08/10/2023)  Depression (PHQ2-9): Medium Risk (08/04/2023)  Financial Resource Strain: Medium Risk (08/03/2023)  Physical Activity: Sufficiently Active (08/03/2023)  Social Connections: Moderately Isolated (08/03/2023)  Stress: No Stress Concern Present (08/03/2023)  Tobacco Use: Low Risk  (08/10/2023)     Readmission Risk Interventions    08/12/2023    4:01 PM 03/16/2023    9:37 AM 09/25/2022    3:29 PM  Readmission Risk Prevention Plan  Transportation Screening Complete Complete Complete  Medication Review Oceanographer) Complete Complete Complete  PCP or Specialist appointment within 3-5 days of discharge Complete Complete Complete   HRI or Home Care Consult Complete Complete Complete  SW Recovery Care/Counseling Consult Complete Complete Complete  Palliative Care Screening Not Applicable Not Applicable Not Applicable  Skilled Nursing Facility Not Applicable Not Applicable Not Applicable

## 2023-08-13 NOTE — Discharge Instructions (Signed)
 1)You are taking Eliquis /apixaban  which is a blood thinner show please , Avoid ibuprofen /Advil /Aleve/Motrin Juluis Ok Powders/Naproxen/BC powders/Meloxicam /Diclofenac/Indomethacin and other Nonsteroidal anti-inflammatory medications as these will make you more likely to bleed and can cause stomach ulcers, can also cause Kidney problems.   2)Watch for bleeding while on Blood Thinners--watch for blood in your stool which can make your stool black, maroon, mahogany or red---, blood in your urine which can make your urine pink or red, nosebleeds , also watch for possible bruising -You are taking Apixaban /Eliquis --- which is a blood thinner--- be careful to avoid injury or falls  3)You need oxygen  at home at 3 L via nasal cannula continuously while awake and while asleep--- smoking or having open fires around oxygen  can cause fire, significant injury and death  4) please follow-up with your sickle cell provider next week in the clinic for recheck and reevaluation and repeat CBC blood test  5) please establish care with HIV/infectious disease provider in [place] as soon as possible to avoid lapses in your treatment

## 2023-08-16 ENCOUNTER — Other Ambulatory Visit: Payer: Self-pay

## 2023-08-16 ENCOUNTER — Telehealth (HOSPITAL_COMMUNITY): Payer: Self-pay | Admitting: *Deleted

## 2023-08-16 ENCOUNTER — Emergency Department (HOSPITAL_COMMUNITY)
Admission: EM | Admit: 2023-08-16 | Discharge: 2023-08-16 | Disposition: A | Discharge status: Home / Self Care | Payer: Self-pay | Attending: Emergency Medicine | Admitting: Emergency Medicine

## 2023-08-16 ENCOUNTER — Encounter (HOSPITAL_COMMUNITY): Payer: Self-pay

## 2023-08-16 DIAGNOSIS — D5700 Hb-SS disease with crisis, unspecified: Secondary | ICD-10-CM | POA: Insufficient documentation

## 2023-08-16 DIAGNOSIS — Z7901 Long term (current) use of anticoagulants: Secondary | ICD-10-CM | POA: Insufficient documentation

## 2023-08-16 LAB — COMPREHENSIVE METABOLIC PANEL WITH GFR
ALT: 16 U/L (ref 0–44)
AST: 35 U/L (ref 15–41)
Albumin: 3.7 g/dL (ref 3.5–5.0)
Alkaline Phosphatase: 42 U/L (ref 38–126)
Anion gap: 8 (ref 5–15)
BUN: 22 mg/dL — ABNORMAL HIGH (ref 6–20)
CO2: 22 mmol/L (ref 22–32)
Calcium: 7.9 mg/dL — ABNORMAL LOW (ref 8.9–10.3)
Chloride: 107 mmol/L (ref 98–111)
Creatinine, Ser: 0.74 mg/dL (ref 0.61–1.24)
GFR, Estimated: >60 mL/min (ref >60)
Glucose, Bld: 77 mg/dL (ref 70–99)
Potassium: 3.6 mmol/L (ref 3.5–5.1)
Sodium: 137 mmol/L (ref 135–145)
Total Bilirubin: 8.7 mg/dL — ABNORMAL HIGH (ref 0.0–1.2)
Total Protein: 7.5 g/dL (ref 6.5–8.1)

## 2023-08-16 LAB — CBC WITH DIFFERENTIAL/PLATELET
Abs Immature Granulocytes: 0.04 10*3/uL (ref 0.00–0.07)
Basophils Absolute: 0 10*3/uL (ref 0.0–0.1)
Basophils Relative: 0 %
Eosinophils Absolute: 0.1 10*3/uL (ref 0.0–0.5)
Eosinophils Relative: 1 %
HCT: 19.6 % — ABNORMAL LOW (ref 39.0–52.0)
Hemoglobin: 7.1 g/dL — ABNORMAL LOW (ref 13.0–17.0)
Immature Granulocytes: 0 %
Lymphocytes Relative: 41 %
Lymphs Abs: 4 10*3/uL (ref 0.7–4.0)
MCH: 34.1 pg — ABNORMAL HIGH (ref 26.0–34.0)
MCHC: 36.2 g/dL — ABNORMAL HIGH (ref 30.0–36.0)
MCV: 94.2 fL (ref 80.0–100.0)
Monocytes Absolute: 1.4 10*3/uL — ABNORMAL HIGH (ref 0.1–1.0)
Monocytes Relative: 14 %
Neutro Abs: 4.1 10*3/uL (ref 1.7–7.7)
Neutrophils Relative %: 44 %
Platelets: 377 10*3/uL (ref 150–400)
RBC: 2.08 MIL/uL — ABNORMAL LOW (ref 4.22–5.81)
RDW: 31.4 % — ABNORMAL HIGH (ref 11.5–15.5)
WBC: 9.7 10*3/uL (ref 4.0–10.5)
nRBC: 6.2 % — ABNORMAL HIGH (ref 0.0–0.2)

## 2023-08-16 LAB — RETICULOCYTES
Immature Retic Fract: 34.1 % — ABNORMAL HIGH (ref 2.3–15.9)
RBC.: 2.09 MIL/uL — ABNORMAL LOW (ref 4.22–5.81)
Retic Count, Absolute: 276.4 10*3/uL — ABNORMAL HIGH (ref 19.0–186.0)
Retic Ct Pct: 13.5 % — ABNORMAL HIGH (ref 0.4–3.1)

## 2023-08-16 MED ORDER — SODIUM CHLORIDE 0.9 % IV SOLN
12.5000 mg | Freq: Once | INTRAVENOUS | Status: AC
  Administered 2023-08-16: 12.5 mg via INTRAVENOUS
  Filled 2023-08-16: qty 12.5

## 2023-08-16 MED ORDER — HYDROMORPHONE HCL 1 MG/ML IJ SOLN
2.0000 mg | INTRAMUSCULAR | Status: AC
  Administered 2023-08-16: 2 mg via INTRAVENOUS
  Filled 2023-08-16: qty 2

## 2023-08-16 MED ORDER — OXYCODONE-ACETAMINOPHEN 5-325 MG PO TABS
2.0000 | ORAL_TABLET | Freq: Once | ORAL | Status: AC
  Administered 2023-08-16: 2 via ORAL
  Filled 2023-08-16: qty 2

## 2023-08-16 MED ORDER — HEPARIN SOD (PORK) LOCK FLUSH 100 UNIT/ML IV SOLN
500.0000 [IU] | Freq: Once | INTRAVENOUS | Status: AC
  Administered 2023-08-16: 500 [IU]
  Filled 2023-08-16: qty 5

## 2023-08-16 MED ORDER — ONDANSETRON HCL 4 MG/2ML IJ SOLN
4.0000 mg | INTRAMUSCULAR | Status: DC | PRN
  Filled 2023-08-16: qty 2

## 2023-08-16 NOTE — Telephone Encounter (Signed)
 Patient called requesting to come to the day hospital for sickle cell pain. Patient reports lower back and bilateral hip pain rated 9/10. Reports taking Percocet at 6:00 am. Patient was recently admitted and discharged from the hospital last Friday. Patient also reports going to the ED on yesterday. COVID-19 screening done and patient denies all symptoms and exposures. Denies fever, chest pain, nausea, vomiting, diarrhea, abdominal pain and priapism. Onyeje, NP notified and advised that patient stay home and try to manage with home pain medications. Patient can call back to the day hospital later this week if pain remained uncontrolled. Patient advised and expresses an understanding.

## 2023-08-16 NOTE — ED Triage Notes (Signed)
 Pt came in for pain in the lower back and hips. Pt stated they were in the hospital on Friday for the same thing, but the pain is getting worse. Pt tried to take a percocet with no relief.  Hx sickle cell

## 2023-08-16 NOTE — ED Provider Notes (Signed)
 [facility]ERGENC[facility] DEPARTMENT AT [facility] Provider Note   CSN: [id number] Arrival date & time: 08/16/23  9528     History  Chief Complaint  Patient presents with   Sickle Cell Pain Crisis    Martin [name] is a 33 y.o. male.  Patient presents to the emergency department for evaluation of sickle cell crisis.  Patient reports that he is experiencing low back and bilateral hip pain which is typical for his sickle cell disease.  Patient reports that he was hospitalized last week with similar.  No chest pain, shortness of breath.  No fever.       Home Medications Prior to Admission medications   Medication Sig Start Date End Date Taking? Authorizing Provider  abacavir -dolutegravir -lamiVUDine  (TRIUMEQ ) 600-50-300 MG tablet Take 1 tablet by mouth daily. 08/13/23   Colin Dawley, MD  albuterol  (VENTOLIN  HFA) 108 (90 Base) MCG/ACT inhaler Inhale 2 puffs into the lungs every 6 (six) hours as needed for wheezing or shortness of breath. 08/13/23   Quintella Buck, Courage, MD  apixaban  (ELIQUIS ) 5 MG TABS tablet Take 1 tablet (5 mg total) by mouth 2 (two) times daily. 08/13/23   Colin Dawley, MD  azithromycin  (ZITHROMAX ) 500 MG tablet Take 1 tablet (500 mg total) by mouth daily for 3 days. 08/13/23 08/16/23  Colin Dawley, MD  cephALEXin  (KEFLEX ) 500 MG capsule Take 1 capsule (500 mg total) by mouth 3 (three) times daily for 5 days. 08/13/23 08/18/23  Colin Dawley, MD  Cholecalciferol  (VITAMIN D3) 125 MCG (5000 UT) CAPS Take 5,000 Units by mouth daily.    [provider]  cyclobenzaprine  (FLEXERIL ) 10 MG tablet Take 10 mg by mouth 2 (two) times daily as needed for muscle spasms. 12/22/22   [provider]  Deferiprone  (FERRIPROX ) 500 MG TABS Take 1 tablet by mouth daily. 08/13/23   Colin Dawley, MD  diphenhydrAMINE  (BENADRYL ) 25 MG tablet Take 1 tablet (25 mg total) by mouth every 4 (four) hours as needed for itching. 08/13/23   Colin Dawley, MD  folic  acid (FOLVITE ) 400 MCG tablet Take 1 tablet (400 mcg total) by mouth daily. 08/13/23   Colin Dawley, MD  gabapentin  (NEURONTIN ) 300 MG capsule Take 1 capsule (300 mg total) by mouth at bedtime as needed (nerve pain). 08/13/23   Colin Dawley, MD  guaiFENesin  (MUCINEX ) 600 MG 12 hr tablet Take 1 tablet (600 mg total) by mouth 2 (two) times daily. 08/13/23 08/12/24  Colin Dawley, MD  hydroxyurea  (HYDREA ) 500 MG capsule Take 4 capsules (2,000 mg total) by mouth at bedtime. 08/13/23   Colin Dawley, MD  NARCAN  4 MG/0.1ML LIQD nasal spray kit Place 1 spray into the nose once as needed (as directed). 01/07/23   [provider]  oxyCODONE -acetaminophen  (PERCOCET) 10-325 MG tablet Take 1 tablet by mouth every 4 (four) hours as needed for pain. Patient taking differently: Take 1 tablet by mouth every 4 (four) hours. 08/04/23   [name], [name], [name]  pantoprazole  (PROTONIX ) 40 MG tablet Take 1 tablet (40 mg total) by mouth daily as needed (acid reflux). 08/13/23   Colin Dawley, MD  senna-docusate (SENOKOT-S) 8.6-50 MG tablet Take 2 tablets by mouth at bedtime. 08/13/23   Colin Dawley, MD  triamcinolone  cream (KENALOG ) 0.1 % Apply 1 Application topically 2 (two) times daily. Patient taking differently: Apply 1 Application topically 2 (two) times daily as needed (for itching). 01/12/23   Sigurd Driver, [name]      Allergies    Ketorolac  tromethamine , Tape,  and Wound dressing adhesive    Review of Systems   Review of Systems  Physical Exam Updated Vital Signs BP 109/73 (BP Location: Right Arm)   Pulse 95   Temp 98.3 F (36.8 C) (Oral)   Resp 12   SpO2 97%  Physical Exam Vitals and nursing note reviewed.  Constitutional:      General: He is not in acute distress.    Appearance: He is well-developed.  HENT:     Head: Normocephalic and atraumatic.     Mouth/Throat:     Mouth: Mucous membranes are moist.  Eyes:     General: Vision grossly intact. Gaze aligned  appropriately.     Extraocular Movements: Extraocular movements intact.     Conjunctiva/sclera: Conjunctivae normal.  Cardiovascular:     Rate and Rhythm: Normal rate and regular rhythm.     Pulses: Normal pulses.     Heart sounds: Normal heart sounds, S1 normal and S2 normal. No murmur heard.    No friction rub. No gallop.  Pulmonary:     Effort: Pulmonary effort is normal. No respiratory distress.     Breath sounds: Normal breath sounds.  Abdominal:     Palpations: Abdomen is soft.     Tenderness: There is no abdominal tenderness. There is no guarding or rebound.     Hernia: No hernia is present.  Musculoskeletal:        General: No swelling.     Cervical back: Full passive range of motion without pain, normal range of motion and neck supple. No pain with movement, spinous process tenderness or muscular tenderness. Normal range of motion.     Right lower leg: No edema.     Left lower leg: No edema.  Skin:    General: Skin is warm and dry.     Capillary Refill: Capillary refill takes less than 2 seconds.     Findings: No ecchymosis, erythema, lesion or wound.  Neurological:     Mental Status: He is alert and oriented to person, place, and time.     GCS: GCS eye subscore is 4. GCS verbal subscore is 5. GCS motor subscore is 6.     Cranial Nerves: Cranial nerves 2-12 are intact.     Sensory: Sensation is intact.     Motor: Motor function is intact. No weakness or abnormal muscle tone.     Coordination: Coordination is intact.  Psychiatric:        Mood and Affect: Mood normal.        Speech: Speech normal.        Behavior: Behavior normal.     ED Results / Procedures / Treatments   Labs (all labs ordered are listed, but only abnormal results are displayed) Labs Reviewed  COMPREHENSIVE METABOLIC PANEL WITH GFR - Abnormal; Notable for the following components:      Result Value   BUN 22 (*)    Calcium  7.9 (*)    Total Bilirubin 8.7 (*)    All other components within normal  limits  CBC WITH DIFFERENTIAL/PLATELET - Abnormal; Notable for the following components:   RBC 2.08 (*)    Hemoglobin 7.1 (*)    HCT 19.6 (*)    MCH 34.1 (*)    MCHC 36.2 (*)    RDW 31.4 (*)    nRBC 6.2 (*)    Monocytes Absolute 1.4 (*)    All other components within normal limits  RETICULOCYTES - Abnormal; Notable for the following components:  Retic Ct Pct 13.5 (*)    RBC. 2.09 (*)    Retic Count, Absolute 276.4 (*)    Immature Retic Fract 34.1 (*)    All other components within normal limits    EKG None  Radiology No results found.  Procedures Procedures    Medications Ordered in ED Medications  ondansetron  (ZOFRAN ) injection 4 mg (has no administration in time range)  HYDROmorphone  (DILAUDID ) injection 2 mg (2 mg Intravenous Given 08/16/23 0342)  HYDROmorphone  (DILAUDID ) injection 2 mg (2 mg Intravenous Given 08/16/23 0415)  HYDROmorphone  (DILAUDID ) injection 2 mg (2 mg Intravenous Given 08/16/23 0455)  diphenhydrAMINE  (BENADRYL ) 12.5 mg in sodium chloride  0.9 % 50 mL IVPB (0 mg Intravenous Stopped 08/16/23 0415)    ED Course/ Medical Decision Making/ A&P                                 Medical Decision Making Amount and/or Complexity of Data Reviewed Labs: ordered.  Risk Prescription drug management.   Presents to the emergency department for evaluation of sickle cell crisis pain.  Patient with pain in back and bilateral hips which is his typical sickle cell pain.  No injury.  No fever.  No chest complaints, no concern for acute chest.  His labs do reveal some decrease in his hemoglobin down to 7.1.  No concern for need for transfusion.  Patient given serial doses of Dilaudid .  Prior to third dose, patient reevaluated.  He reports that his pain is improved and that he feels like he does not need to be admitted.  CRITICAL CARE Performed by: Ballard Bongo   Total critical care time: 30 minutes  Critical care time was exclusive of separately billable  procedures and treating other patients.  Critical care was necessary to treat or prevent imminent or life-threatening deterioration.  Critical care was time spent personally by me on the following activities: development of treatment plan with patient and/or surrogate as well as nursing, discussions with consultants, evaluation of patient's response to treatment, examination of patient, obtaining history from patient or surrogate, ordering and performing treatments and interventions, ordering and review of laboratory studies, ordering and review of radiographic studies, pulse oximetry and re-evaluation of patient's condition.         Final Clinical Impression(s) / ED Diagnoses Final diagnoses:  Sickle cell pain crisis [facility])    Rx / DC Orders ED Discharge Orders     None         Luke Falero, Marine Sia, MD 08/16/23 0502

## 2023-08-17 ENCOUNTER — Other Ambulatory Visit: Payer: Self-pay | Admitting: Nurse Practitioner

## 2023-08-17 ENCOUNTER — Encounter (HOSPITAL_BASED_OUTPATIENT_CLINIC_OR_DEPARTMENT_OTHER): Payer: Self-pay

## 2023-08-17 DIAGNOSIS — D571 Sickle-cell disease without crisis: Secondary | ICD-10-CM

## 2023-08-17 DIAGNOSIS — F1190 Opioid use, unspecified, uncomplicated: Secondary | ICD-10-CM

## 2023-08-17 NOTE — Telephone Encounter (Signed)
 Copied from CRM [id number]. Topic: Clinical - Medication Refill >> Aug 17, 2023  1:09 PM Martin [name] wrote: Most Recent Primary Care Visit:  Provider: Jacquetta Mattocks  Department: [facility]  Visit Type: OFFICE VISIT  Date: 08/04/2023  Medication: oxyCODONE -acetaminophen  (PERCOCET) 10-325 MG tablet   Has the patient contacted their pharmacy? No  (Agent: If no, request that the patient contact the pharmacy for the refill. If patient does not wish to contact the pharmacy document the reason why and proceed with request.) (Agent: If yes, when and what did the pharmacy advise?)  Is this the correct pharmacy for this prescription? Yes  If no, delete pharmacy and type the correct one.  This is the patient's preferred pharmacy:  Melodee Spruce [facility] - [facility] [address] [place] [address] Phone: [phone] Fax: [phone]   Has the prescription been filled recently? Yes  Is the patient out of the medication? Yes  Has the patient been seen for an appointment in the last year OR does the patient have an upcoming appointment? Yes  Can we respond through MyChart? Yes  Agent: Please be advised that Rx refills may take up to 3 business days. We ask that you follow-up with your pharmacy.

## 2023-08-18 ENCOUNTER — Other Ambulatory Visit (HOSPITAL_COMMUNITY): Payer: Self-pay

## 2023-08-18 ENCOUNTER — Non-Acute Institutional Stay (HOSPITAL_COMMUNITY)
Admission: AD | Admit: 2023-08-18 | Discharge: 2023-08-18 | Disposition: A | Discharge status: Home / Self Care | Payer: Self-pay | Source: Ambulatory Visit | Attending: Internal Medicine | Admitting: Internal Medicine

## 2023-08-18 ENCOUNTER — Other Ambulatory Visit: Payer: Self-pay | Admitting: Nurse Practitioner

## 2023-08-18 DIAGNOSIS — F1120 Opioid dependence, uncomplicated: Secondary | ICD-10-CM | POA: Insufficient documentation

## 2023-08-18 DIAGNOSIS — G894 Chronic pain syndrome: Secondary | ICD-10-CM | POA: Insufficient documentation

## 2023-08-18 DIAGNOSIS — D5700 Hb-SS disease with crisis, unspecified: Secondary | ICD-10-CM | POA: Diagnosis present

## 2023-08-18 DIAGNOSIS — Z21 Asymptomatic human immunodeficiency virus [HIV] infection status: Secondary | ICD-10-CM | POA: Insufficient documentation

## 2023-08-18 DIAGNOSIS — Z86711 Personal history of pulmonary embolism: Secondary | ICD-10-CM | POA: Insufficient documentation

## 2023-08-18 DIAGNOSIS — D571 Sickle-cell disease without crisis: Secondary | ICD-10-CM

## 2023-08-18 DIAGNOSIS — F1190 Opioid use, unspecified, uncomplicated: Secondary | ICD-10-CM

## 2023-08-18 DIAGNOSIS — Z7901 Long term (current) use of anticoagulants: Secondary | ICD-10-CM | POA: Insufficient documentation

## 2023-08-18 LAB — CBC WITH DIFFERENTIAL/PLATELET
Abs Immature Granulocytes: 0.14 10*3/uL — ABNORMAL HIGH (ref 0.00–0.07)
Basophils Absolute: 0.1 10*3/uL (ref 0.0–0.1)
Basophils Relative: 1 %
Eosinophils Absolute: 0.1 10*3/uL (ref 0.0–0.5)
Eosinophils Relative: 1 %
HCT: 23.9 % — ABNORMAL LOW (ref 39.0–52.0)
Hemoglobin: 8.2 g/dL — ABNORMAL LOW (ref 13.0–17.0)
Immature Granulocytes: 2 %
Lymphocytes Relative: 40 %
Lymphs Abs: 3.4 10*3/uL (ref 0.7–4.0)
MCH: 34.9 pg — ABNORMAL HIGH (ref 26.0–34.0)
MCHC: 34.3 g/dL (ref 30.0–36.0)
MCV: 101.7 fL — ABNORMAL HIGH (ref 80.0–100.0)
Monocytes Absolute: 1.3 10*3/uL — ABNORMAL HIGH (ref 0.1–1.0)
Monocytes Relative: 16 %
Neutro Abs: 3.3 10*3/uL (ref 1.7–7.7)
Neutrophils Relative %: 40 %
Platelets: 467 10*3/uL — ABNORMAL HIGH (ref 150–400)
RBC: 2.35 MIL/uL — ABNORMAL LOW (ref 4.22–5.81)
RDW: 33.5 % — ABNORMAL HIGH (ref 11.5–15.5)
WBC: 8.2 10*3/uL (ref 4.0–10.5)
nRBC: 52.6 % — ABNORMAL HIGH (ref 0.0–0.2)

## 2023-08-18 LAB — RETICULOCYTES
Immature Retic Fract: 35.1 % — ABNORMAL HIGH (ref 2.3–15.9)
RBC.: 2.4 MIL/uL — ABNORMAL LOW (ref 4.22–5.81)
Retic Count, Absolute: 450.4 10*3/uL — ABNORMAL HIGH (ref 19.0–186.0)
Retic Ct Pct: 18.8 % — ABNORMAL HIGH (ref 0.4–3.1)

## 2023-08-18 LAB — LACTATE DEHYDROGENASE: LDH: 464 U/L — ABNORMAL HIGH (ref 98–192)

## 2023-08-18 LAB — RAPID URINE DRUG SCREEN, HOSP PERFORMED
Amphetamines: NOT DETECTED
Barbiturates: NOT DETECTED
Benzodiazepines: NOT DETECTED
Cocaine: NOT DETECTED
Opiates: POSITIVE — AB
Tetrahydrocannabinol: NOT DETECTED

## 2023-08-18 MED ORDER — NALOXONE HCL 0.4 MG/ML IJ SOLN: 0.4000 mg | INTRAMUSCULAR | Status: DC | PRN

## 2023-08-18 MED ORDER — HEPARIN SOD (PORK) LOCK FLUSH 100 UNIT/ML IV SOLN
500.0000 [IU] | INTRAVENOUS | Status: AC | PRN
  Administered 2023-08-18: 500 [IU]
  Filled 2023-08-18: qty 5

## 2023-08-18 MED ORDER — SENNOSIDES-DOCUSATE SODIUM 8.6-50 MG PO TABS: 1.0000 | ORAL_TABLET | Freq: Two times a day (BID) | ORAL | Status: DC

## 2023-08-18 MED ORDER — SODIUM CHLORIDE 0.45 % IV SOLN: INTRAVENOUS | Status: DC

## 2023-08-18 MED ORDER — OXYCODONE-ACETAMINOPHEN 10-325 MG PO TABS
1.0000 | ORAL_TABLET | ORAL | 0 refills | Status: DC | PRN
  Filled 2023-08-19: qty 90, 15d supply, fill #0

## 2023-08-18 MED ORDER — DIPHENHYDRAMINE HCL 25 MG PO CAPS
25.0000 mg | ORAL_CAPSULE | ORAL | Status: DC | PRN
  Administered 2023-08-18: 25 mg via ORAL
  Filled 2023-08-18: qty 1

## 2023-08-18 MED ORDER — SODIUM CHLORIDE 0.9% FLUSH: 9.0000 mL | INTRAVENOUS | Status: DC | PRN

## 2023-08-18 MED ORDER — HYDROMORPHONE 1 MG/ML IV SOLN
INTRAVENOUS | Status: DC
  Administered 2023-08-18: 30 mg via INTRAVENOUS
  Administered 2023-08-18: 13 mg via INTRAVENOUS
  Filled 2023-08-18: qty 30

## 2023-08-18 MED ORDER — SODIUM CHLORIDE 0.9% FLUSH
10.0000 mL | INTRAVENOUS | Status: AC | PRN
  Administered 2023-08-18: 10 mL

## 2023-08-18 MED ORDER — POLYETHYLENE GLYCOL 3350 17 G PO PACK: 17.0000 g | PACK | Freq: Every day | ORAL | Status: DC | PRN

## 2023-08-18 MED ORDER — ONDANSETRON HCL 4 MG/2ML IJ SOLN: 4.0000 mg | Freq: Four times a day (QID) | INTRAMUSCULAR | Status: DC | PRN

## 2023-08-18 NOTE — Progress Notes (Signed)
 Pt admitted to the day hospital for sickle cell pain treatment. Pt reports 9/10 pain to his back. Pt received Dilaudid  PCA and hydrated with IV fluids via PAC. Pt also received PO Benadryl . At discharge, pt reports 6/10 pain. PAC de-accessed and flushed with 0.9% Sodium Chloride  and Heparin , site is covered with a band-aid. AVS offered, but pt declined. Pt is alert, oriented, and ambulatory at discharge.

## 2023-08-18 NOTE — Discharge Summary (Signed)
 Physician Discharge Summary  Martin [name] MVH:[id number] DOB: December 25, 1990 DOA: 08/18/2023  PCP: [name], [name], [name]  Admit date: 08/18/2023  Discharge date: 08/18/2023  Time spent: 30 minutes  Discharge Diagnoses:  Active Problems:   Sickle cell anemia with pain [facility])   Discharge Condition: Stable  Diet recommendation: Regular  History of present illness:  Martin [name] is a 33 y.o. male with history of sickle cell disease, chronic pain syndrome, opiate dependency and tolerance, history of HIV, and history of PE on Xarelto.  Patient presents to the day hospital with generalize body pain and lower extremity pain. Patient reports that he has been taking his oxycodone  at home without improvement. He was seen at the sickle cell pain clinic 7 days ago and discharged from the hospital 5 days ago  for the same symptoms. Pain was controlled and patient was discharged home, however the pain returned. He is on 3l oxygen  at home. Denies cough, fever, chest pain, diarrhea /vomiting. He has had no sick contacts.   Hospital Course:  Martin [name] was admitted to the day hospital with sickle cell painful crisis. Patient was treated with IV fluid, weight based IV Dilaudid  PCA, IV Toradol , clinician assisted doses as deemed appropriate, and other adjunct therapies per sickle cell pain management protocol. Alwaleed showed significant improvement symptomatically, pain improved from 9/10 to 6/10 at the time of discharge. Patient was discharged home in a hemodynamically stable condition. Reign will follow-up at the clinic as previously scheduled, continue with home medications as per prior to admission.  Discharge Instructions We discussed the need for good hydration, monitoring of hydration status, avoidance of heat, cold, stress, and infection triggers. We discussed the need to be compliant with taking Hydrea  and other home medications. [name] was reminded of the need to seek  medical attention immediately if any symptom of bleeding, anemia, or infection occurs.  Discharge Exam: Vitals:   08/18/23 1219 08/18/23 1403  BP: 101/60 98/63  Pulse: 82 82  Resp: 14 12  Temp: 97.9 F (36.6 C) 98.1 F (36.7 C)  SpO2: 92% 93%    General appearance: alert, cooperative and no distress Eyes: conjunctivae/corneas clear. PERRL, EOM's intact. Fundi benign. Neck: no adenopathy, no carotid bruit, no JVD, supple, symmetrical, trachea midline and thyroid not enlarged, symmetric, no tenderness/mass/nodules Back: symmetric, no curvature. ROM normal. No CVA tenderness. Resp: clear to auscultation bilaterally Chest wall: no tenderness Cardio: regular rate and rhythm, S1, S2 normal, no murmur, click, rub or gallop GI: soft, non-tender; bowel sounds normal; no masses,  no organomegaly Extremities: extremities normal, atraumatic, no cyanosis or edema Pulses: 2+ and symmetric Skin: Skin color, texture, turgor normal. No rashes or lesions Neurologic: Grossly normal  Discharge Instructions     Diet - low sodium heart healthy   Complete by: As directed    Increase activity slowly   Complete by: As directed       Allergies as of 08/18/2023       Reactions   Ketorolac  Tromethamine  Swelling, Other (See Comments)   Patient reports facial edema and left arm edema after administration.   Tape Rash, Other (See Comments)   PLEASE DO NOT USE THE CLEAR, THICK, "PLASTIC" TAPE- only paper tape is tolerated    Wound Dressing Adhesive Rash        Medication List     TAKE these medications    albuterol  108 (90 Base) MCG/ACT inhaler Commonly known as: VENTOLIN  HFA Inhale 2 puffs into the lungs every 6 (  six) hours as needed for wheezing or shortness of breath.   cephALEXin  500 MG capsule Commonly known as: KEFLEX  Take 1 capsule (500 mg total) by mouth 3 (three) times daily for 5 days.   cyclobenzaprine  10 MG tablet Commonly known as: FLEXERIL  Take 10 mg by mouth 2 (two) times  daily as needed for muscle spasms.   Deferiprone  500 MG Tabs Commonly known as: Ferriprox  Take 1 tablet by mouth daily.   diphenhydrAMINE  25 MG tablet Commonly known as: BENADRYL  Take 1 tablet (25 mg total) by mouth every 4 (four) hours as needed for itching.   Eliquis  5 MG Tabs tablet Generic drug: apixaban  Take 1 tablet (5 mg total) by mouth 2 (two) times daily.   folic acid  400 MCG tablet Commonly known as: FOLVITE  Take 1 tablet (400 mcg total) by mouth daily.   gabapentin  300 MG capsule Commonly known as: NEURONTIN  Take 1 capsule (300 mg total) by mouth at bedtime as needed (nerve pain).   guaiFENesin  600 MG 12 hr tablet Commonly known as: Mucinex  Take 1 tablet (600 mg total) by mouth 2 (two) times daily.   hydroxyurea  500 MG capsule Commonly known as: HYDREA  Take 4 capsules (2,000 mg total) by mouth at bedtime.   Narcan  4 MG/0.1ML Liqd nasal spray kit Generic drug: naloxone  Place 1 spray into the nose once as needed (as directed).   oxyCODONE -acetaminophen  10-325 MG tablet Commonly known as: PERCOCET Take 1 tablet by mouth every 4 (four) hours as needed for pain. Start taking on: Aug 19, 2023 What changed: These instructions start on Aug 19, 2023. If you are unsure what to do until then, ask your doctor or other care provider.   pantoprazole  40 MG tablet Commonly known as: PROTONIX  Take 1 tablet (40 mg total) by mouth daily as needed (acid reflux).   senna-docusate 8.6-50 MG tablet Commonly known as: Senokot-S Take 2 tablets by mouth at bedtime.   triamcinolone  cream 0.1 % Commonly known as: KENALOG  Apply 1 Application topically 2 (two) times daily.   Triumeq  600-50-300 MG tablet Generic drug: abacavir -dolutegravir -lamiVUDine  Take 1 tablet by mouth daily.   Vitamin D3 125 MCG (5000 UT) Caps Take 5,000 Units by mouth daily.       Allergies  Allergen Reactions   Ketorolac  Tromethamine  Swelling and Other (See Comments)    Patient reports facial edema  and left arm edema after administration.   Tape Rash and Other (See Comments)    PLEASE DO NOT USE THE CLEAR, THICK, "PLASTIC" TAPE- only paper tape is tolerated    Wound Dressing Adhesive Rash     Significant Diagnostic Studies: CT Angio Chest Pulmonary Embolism (PE) W or WO Contrast Result Date: 08/13/2023 CLINICAL DATA:  Sickle cell crisis. History of HIV. Concern for acute pulmonary embolus. Chest pain and elevated white count. EXAM: CT ANGIOGRAPHY CHEST WITH CONTRAST TECHNIQUE: Multidetector CT imaging of the chest was performed using the standard protocol during bolus administration of intravenous contrast. Multiplanar CT image reconstructions and MIPs were obtained to evaluate the vascular anatomy. RADIATION DOSE REDUCTION: This exam was performed according to the departmental dose-optimization program which includes automated exposure control, adjustment of the mA and/or kV according to patient size and/or use of iterative reconstruction technique. CONTRAST:  75mL OMNIPAQUE  IOHEXOL  350 MG/ML SOLN COMPARISON:  09/15/2022 FINDINGS: Cardiovascular: Suboptimal opacification of the pulmonary arteries as well as respiratory motion artifact diminishes exam detail. Within these limitations, there are no signs to suggest a clinically significant acute pulmonary embolus. Mild cardiac enlargement.  No pericardial effusion. Mediastinum/Nodes: Thyroid gland, trachea and esophagus appear normal. No enlarged mediastinal or hilar lymph nodes. Lungs/Pleura: Extensive pulmonary parenchymal scarring, architectural distortion with bilateral multifocal areas of ground-glass attenuation with a lower lung zone predominance. Subsegmental atelectasis identified within the left base. No airspace consolidation or pneumothorax. There are 2 subjacent nodules within the left upper lobe which appear new from previous exam measuring up to 4 mm, image 49/7. Upper Abdomen: No acute abnormality.  Atrophic calcified spleen.  Musculoskeletal: Bony stigmata of sickle cell disease. No acute or suspicious osseous findings Review of the MIP images confirms the above findings. IMPRESSION: 1. Suboptimal opacification of the pulmonary arteries as well as respiratory motion artifact diminishes exam detail. Within these limitations, there are no signs to suggest a clinically significant acute pulmonary embolus. 2. Extensive pulmonary parenchymal scarring, architectural distortion with bilateral multifocal areas of ground-glass attenuation with a lower lung zone predominance. 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 3. There are 2 subjacent nodules within the left upper lobe which appear new from previous exam measuring up to 4 mm. If the patient is at high risk for bronchogenic carcinoma, follow-up chest CT at 1year is recommended. If the patient is at low risk, no follow-up is needed. This recommendation follows the consensus statement: Guidelines for Management of Small Pulmonary Nodules Detected on CT Scans: A Statement from the Fleischner Society as published in Radiology 2005; 237:395-400. Electronically Signed   By: Kimberley Penman M.D.   On: 08/13/2023 07:24   DG Chest 2 View Result Date: 08/10/2023 CLINICAL DATA:  Decreased lung sounds EXAM: CHEST - 2 VIEW COMPARISON:  Chest radiograph June 19, 2023 FINDINGS: Port-A-Cath tip projects over the superior vena cava. Monitoring leads overlie the patient. Stable cardiac and mediastinal contours. Interval development of bilateral predominantly mid lung peripheral areas of consolidation. This is upon a background of chronic interstitial opacities. No definite pleural effusion or pneumothorax. Osseous structures unremarkable. IMPRESSION: Interval development of bilateral predominantly mid lung peripheral areas of consolidation. Findings are concerning for multifocal pneumonia. Electronically Signed   By: Jone Neither M.D.   On: 08/10/2023 14:48     Signed:  Lorel Roes NP  08/18/2023, 3:24 PM

## 2023-08-18 NOTE — H&P (Signed)
 [facility] History and Physical  Martin [name] ZHY:[id number] DOB: 04-14-1991 DOA: 08/18/2023  PCP: [name], [name], [name]   Chief Complaint:  Chief Complaint  Patient presents with   Sickle Cell Pain Crisis    HPI: Martin [name] is a 33 y.o. male with history of sickle cell disease, chronic pain syndrome, opiate dependency and tolerance, history of HIV, and history of PE on Xarelto.  Patient presents to the day hospital with generalize body pain and lower extremity pain. Patient reports that he has been taking his oxycodone  at home without improvement. He was seen at the sickle cell pain clinic 7 days ago and discharged from the hospital 5 days ago  for the same symptoms. Pain was controlled and patient was discharged home, however the pain returned. He is on 3l oxygen  at home. Denies cough, fever, chest pain, diarrhea /vomiting. He has had no sick contacts.     Systemic Review: General: The patient denies anorexia, fever, weight loss Cardiac: Denies chest pain, syncope, palpitations, pedal edema  Respiratory: Denies cough, shortness of breath, wheezing GI: Denies severe indigestion/heartburn, abdominal pain, nausea, vomiting, diarrhea and constipation GU: Denies hematuria, incontinence, dysuria  Musculoskeletal: Lower back pain, generalize pain  Skin: Denies suspicious skin lesions Neurologic: Denies focal weakness or numbness, change in vision  Past Medical History:  Diagnosis Date   Anxiety    HIV (human immunodeficiency virus infection) (HCC)    Proteinuria    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HCC)    Vitamin D  deficiency 10/2018    Past Surgical History:  Procedure Laterality Date   IR IMAGING GUIDED PORT INSERTION  08/29/2019   IR REMOVAL TUN ACCESS W/ PORT W/O FL MOD SED  08/30/2020   TEE WITHOUT CARDIOVERSION N/A 08/30/2020   Procedure: TRANSESOPHAGEAL ECHOCARDIOGRAM (TEE);  Surgeon: Hazle Lites, MD;  Location: [facility] ENDOSCOPY;  Service: Cardiovascular;   Laterality: N/A;    Allergies  Allergen Reactions   Ketorolac  Tromethamine  Swelling and Other (See Comments)    Patient reports facial edema and left arm edema after administration.   Tape Rash and Other (See Comments)    PLEASE DO NOT USE THE CLEAR, THICK, "PLASTIC" TAPE- only paper tape is tolerated    Wound Dressing Adhesive Rash    Family History  Problem Relation Age of Onset   Sickle cell trait Mother    Sickle cell trait Father    Birth defects Maternal Grandmother    Birth defects Paternal Grandmother       Prior to Admission medications   Medication Sig Start Date End Date Taking? Authorizing Provider  abacavir -dolutegravir -lamiVUDine  (TRIUMEQ ) 600-50-300 MG tablet Take 1 tablet by mouth daily. 08/13/23  Yes Emokpae, Courage, MD  apixaban  (ELIQUIS ) 5 MG TABS tablet Take 1 tablet (5 mg total) by mouth 2 (two) times daily. 08/13/23  Yes Emokpae, Courage, MD  Cholecalciferol  (VITAMIN D3) 125 MCG (5000 UT) CAPS Take 5,000 Units by mouth daily.   Yes [provider]  cyclobenzaprine  (FLEXERIL ) 10 MG tablet Take 10 mg by mouth 2 (two) times daily as needed for muscle spasms. 12/22/22  Yes [provider]  folic acid  (FOLVITE ) 400 MCG tablet Take 1 tablet (400 mcg total) by mouth daily. 08/13/23  Yes Emokpae, Courage, MD  gabapentin  (NEURONTIN ) 300 MG capsule Take 1 capsule (300 mg total) by mouth at bedtime as needed (nerve pain). 08/13/23  Yes Emokpae, Courage, MD  hydroxyurea  (HYDREA ) 500 MG capsule Take 4 capsules (2,000 mg total) by mouth at bedtime.  08/13/23  Yes Emokpae, Courage, MD  oxyCODONE -acetaminophen  (PERCOCET) 10-325 MG tablet Take 1 tablet by mouth every 4 (four) hours as needed for pain. Patient taking differently: Take 1 tablet by mouth every 4 (four) hours. 08/04/23  Yes [name], [name], [name]  senna-docusate (SENOKOT-S) 8.6-50 MG tablet Take 2 tablets by mouth at bedtime. 08/13/23  Yes Colin Dawley, MD  albuterol  (VENTOLIN  HFA) 108 (90 Base)  MCG/ACT inhaler Inhale 2 puffs into the lungs every 6 (six) hours as needed for wheezing or shortness of breath. 08/13/23   Quintella Buck, Courage, MD  cephALEXin  (KEFLEX ) 500 MG capsule Take 1 capsule (500 mg total) by mouth 3 (three) times daily for 5 days. 08/13/23 08/18/23  Colin Dawley, MD  Deferiprone  (FERRIPROX ) 500 MG TABS Take 1 tablet by mouth daily. 08/13/23   Colin Dawley, MD  diphenhydrAMINE  (BENADRYL ) 25 MG tablet Take 1 tablet (25 mg total) by mouth every 4 (four) hours as needed for itching. 08/13/23   Colin Dawley, MD  guaiFENesin  (MUCINEX ) 600 MG 12 hr tablet Take 1 tablet (600 mg total) by mouth 2 (two) times daily. 08/13/23 08/12/24  Colin Dawley, MD  NARCAN  4 MG/0.1ML LIQD nasal spray kit Place 1 spray into the nose once as needed (as directed). 01/07/23   [provider]  pantoprazole  (PROTONIX ) 40 MG tablet Take 1 tablet (40 mg total) by mouth daily as needed (acid reflux). 08/13/23   Colin Dawley, MD  triamcinolone  cream (KENALOG ) 0.1 % Apply 1 Application topically 2 (two) times daily. Patient taking differently: Apply 1 Application topically 2 (two) times daily as needed (for itching). 01/12/23   Sigurd Driver, [name]     Physical Exam: Vitals:   08/18/23 1046 08/18/23 1055 08/18/23 1219  BP: 92/64  101/60  Pulse: 77  82  Resp: 16 18 14   Temp: 98.3 F (36.8 C)  97.9 F (36.6 C)  TempSrc: Temporal  Temporal  SpO2: 97%  92%    General: Alert, awake, afebrile, anicteric, not in obvious distress HEENT: Normocephalic and Atraumatic, Mucous membranes pink                PERRLA; EOM intact; No scleral icterus,                 Nares: Patent, Oropharynx: Clear, Fair Dentition                 Neck: FROM, no cervical lymphadenopathy, thyromegaly, carotid bruit or JVD;  CHEST WALL: No tenderness  CHEST: Normal respiration, clear to auscultation bilaterally  HEART: Regular rate and rhythm; no murmurs rubs or gallops  BACK: No kyphosis or scoliosis; no CVA  tenderness  ABDOMEN: Positive Bowel Sounds, soft, non-tender; no masses, no organomegaly EXTREMITIES: No cyanosis, clubbing, or edema SKIN:  no rash or ulceration  CNS: Alert and Oriented x 4, Nonfocal exam, CN 2-12 intact  Labs on Admission:  Basic Metabolic Panel: Recent Labs  Lab 08/16/23 0346  NA 137  K 3.6  CL 107  CO2 22  GLUCOSE 77  BUN 22*  CREATININE 0.74  CALCIUM  7.9*   Liver Function Tests: Recent Labs  Lab 08/16/23 0346  AST 35  ALT 16  ALKPHOS 42  BILITOT 8.7*  PROT 7.5  ALBUMIN 3.7   No results for input(s): "LIPASE", "AMYLASE" in the last 168 hours. No results for input(s): "AMMONIA" in the last 168 hours. CBC: Recent Labs  Lab 08/16/23 0346  WBC 9.7  NEUTROABS 4.1  HGB 7.1*  HCT 19.6*  MCV 94.2  PLT 377   Cardiac Enzymes: No results for input(s): "CKTOTAL", "CKMB", "CKMBINDEX", "TROPONINI" in the last 168 hours.  BNP (last 3 results) No results for input(s): "BNP" in the last 8760 hours.  ProBNP (last 3 results) No results for input(s): "PROBNP" in the last 8760 hours.  CBG: No results for input(s): "GLUCAP" in the last 168 hours.   Assessment/Plan Active Problems:   Sickle cell anemia with pain (HCC)  Admits to the Day Hospital for extended observation IVF 0.45% Saline @ 125 mls/hour Weight based Dilaudid  PCA started within 30 minutes of admission IV Toradol  15 mg x 1 doses Acetaminophen  1000 mg x 1 dose Labs: CBCD, Retic Count and LDH Monitor vitals very closely, Re-evaluate pain scale every hour 2 L of Oxygen  by [place] Patient will be re-evaluated for pain in the context of function and relationship to baseline as care progresses. If no significant relieve from pain (remains above 5/10) will transfer patient to inpatient services for further evaluation and management  Code Status: Full  Family Communication: None  DVT Prophylaxis: Ambulate as tolerated   Time spent: 35 Minutes  Lorel Roes NP   If 7PM-7AM, please  contact night-coverage [email or web address] 08/18/2023, 1:18 PM

## 2023-08-18 NOTE — Progress Notes (Signed)
 Reviewed PDMP substance reporting system prior to prescribing opiate medications. No inconsistencies noted.   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  Dispense: 90 tablet; Refill: 0  2. Chronic, continuous use of opioids  - oxyCODONE -acetaminophen  (PERCOCET) 10-325 MG tablet; Take 1 tablet by mouth every 4 (four) hours as needed for pain.  Dispense: 90 tablet; Refill: 0

## 2023-08-19 ENCOUNTER — Other Ambulatory Visit (HOSPITAL_COMMUNITY): Payer: Self-pay

## 2023-08-26 ENCOUNTER — Telehealth: Payer: Self-pay

## 2023-08-26 ENCOUNTER — Other Ambulatory Visit (HOSPITAL_COMMUNITY): Payer: Self-pay

## 2023-08-26 NOTE — Telephone Encounter (Signed)
 [facility] Patient Advocate Encounter ? ?Insurance verification completed.   ? ?The patient is uninsured and will need patient assistance for medication. ? ?We can complete the application and will need to meet with the patient for signatures and income documentation. ? ?Clearance Coots, CPhT ?Specialty Pharmacy Patient Advocate ?[facility] ?Phone: [phone] ?Fax:  [phone]  ?

## 2023-09-01 ENCOUNTER — Telehealth: Payer: Self-pay | Admitting: Nurse Practitioner

## 2023-09-01 NOTE — Telephone Encounter (Unsigned)
 Copied from CRM [id number]. Topic: Clinical - Medication Refill >> Sep 01, 2023 12:05 PM Essie A wrote: Medication: oxyCODONE -acetaminophen  (PERCOCET) 10-325 MG tablet  Has the patient contacted their pharmacy? No (Agent: If no, request that the patient contact the pharmacy for the refill. If patient does not wish to contact the pharmacy document the reason why and proceed with request.) (Agent: If yes, when and what did the pharmacy advise?)  This is the patient's preferred pharmacy:  [id number] - [place], [place] - [address] AT [facility] [address] [place] [address] Phone: [phone] Fax: [phone] Hours: Not open 24 hours  Is this the correct pharmacy for this prescription? Yes If no, delete pharmacy and type the correct one.   Has the prescription been filled recently? Yes  Is the patient out of the medication? No  Has the patient been seen for an appointment in the last year OR does the patient have an upcoming appointment? Yes  Can we respond through MyChart? Yes  Agent: Please be advised that Rx refills may take up to 3 business days. We ask that you follow-up with your pharmacy.

## 2023-09-02 ENCOUNTER — Ambulatory Visit: Payer: Self-pay

## 2023-09-02 ENCOUNTER — Ambulatory Visit: Payer: Self-pay | Admitting: Infectious Diseases

## 2023-09-02 ENCOUNTER — Other Ambulatory Visit: Payer: Self-pay | Admitting: Nurse Practitioner

## 2023-09-02 ENCOUNTER — Ambulatory Visit: Payer: Self-pay | Admitting: Pharmacist

## 2023-09-02 DIAGNOSIS — F1190 Opioid use, unspecified, uncomplicated: Secondary | ICD-10-CM

## 2023-09-02 DIAGNOSIS — D571 Sickle-cell disease without crisis: Secondary | ICD-10-CM

## 2023-09-02 LAB — HIV GENOSURE(R) MG

## 2023-09-02 LAB — HIV-1 RNA, PCR (GRAPH) RFX/GENO EDI
HIV-1 RNA BY PCR: 570 {copies}/mL
HIV-1 RNA Quant, Log: 2.756 {Log_copies}/mL

## 2023-09-02 LAB — REFLEX TO GENOSURE(R) MG EDI

## 2023-09-03 ENCOUNTER — Other Ambulatory Visit (HOSPITAL_COMMUNITY): Payer: Self-pay

## 2023-09-03 ENCOUNTER — Other Ambulatory Visit: Payer: Self-pay | Admitting: Nurse Practitioner

## 2023-09-03 DIAGNOSIS — D571 Sickle-cell disease without crisis: Secondary | ICD-10-CM

## 2023-09-03 DIAGNOSIS — F1190 Opioid use, unspecified, uncomplicated: Secondary | ICD-10-CM

## 2023-09-03 MED ORDER — OXYCODONE-ACETAMINOPHEN 10-325 MG PO TABS
1.0000 | ORAL_TABLET | ORAL | 0 refills | Status: DC | PRN
  Filled 2023-09-03 – 2023-09-04 (×2): qty 90, 15d supply, fill #0

## 2023-09-03 NOTE — Progress Notes (Signed)
 Reviewed PDMP substance reporting system prior to prescribing opiate medications. No inconsistencies noted.   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  Dispense: 90 tablet; Refill: 0  2. Chronic, continuous use of opioids  - oxyCODONE -acetaminophen  (PERCOCET) 10-325 MG tablet; Take 1 tablet by mouth every 4 (four) hours as needed for pain.  Dispense: 90 tablet; Refill: 0

## 2023-09-04 ENCOUNTER — Other Ambulatory Visit (HOSPITAL_COMMUNITY): Payer: Self-pay

## 2023-09-16 ENCOUNTER — Ambulatory Visit: Payer: Self-pay | Admitting: Family Medicine

## 2023-09-27 ENCOUNTER — Other Ambulatory Visit: Payer: Self-pay | Admitting: Nurse Practitioner

## 2023-09-27 ENCOUNTER — Other Ambulatory Visit (HOSPITAL_COMMUNITY): Payer: Self-pay

## 2023-09-27 DIAGNOSIS — D571 Sickle-cell disease without crisis: Secondary | ICD-10-CM

## 2023-09-27 DIAGNOSIS — F1190 Opioid use, unspecified, uncomplicated: Secondary | ICD-10-CM

## 2023-09-27 MED ORDER — OXYCODONE-ACETAMINOPHEN 10-325 MG PO TABS
1.0000 | ORAL_TABLET | ORAL | 0 refills | Status: DC | PRN
  Filled 2023-09-27: qty 90, 15d supply, fill #0

## 2023-09-27 NOTE — Telephone Encounter (Unsigned)
 Copied from CRM [id number]. Topic: Clinical - Medication Refill >> Sep 27, 2023  3:26 PM Ary Bitter R wrote: Medication: oxyCODONE -acetaminophen  (PERCOCET) 10-325 MG tablet   Has the patient contacted their pharmacy? No. No refills  This is the patient's preferred pharmacy:  [facility] [address] [place] [address] Phone: [phone] Fax: [phone]  Is this the correct pharmacy for this prescription? Yes If no, delete pharmacy and type the correct one.   Has the prescription been filled recently? Yes  Is the patient out of the medication? Yes  Has the patient been seen for an appointment in the last year OR does the patient have an upcoming appointment? Yes  Can we respond through MyChart? Yes  Agent: Please be advised that Rx refills may take up to 3 business days. We ask that you follow-up with your pharmacy.

## 2023-09-27 NOTE — Progress Notes (Signed)
 Reviewed PDMP substance reporting system prior to prescribing opiate medications. No inconsistencies noted.   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  Dispense: 90 tablet; Refill: 0  2. Chronic, continuous use of opioids  - oxyCODONE -acetaminophen  (PERCOCET) 10-325 MG tablet; Take 1 tablet by mouth every 4 (four) hours as needed for pain.  Dispense: 90 tablet; Refill: 0

## 2023-09-28 ENCOUNTER — Other Ambulatory Visit (HOSPITAL_COMMUNITY): Payer: Self-pay

## 2023-10-03 ENCOUNTER — Other Ambulatory Visit: Payer: Self-pay

## 2023-10-03 ENCOUNTER — Encounter (HOSPITAL_COMMUNITY): Payer: Self-pay

## 2023-10-03 ENCOUNTER — Emergency Department (HOSPITAL_COMMUNITY)
Admission: EM | Admit: 2023-10-03 | Discharge: 2023-10-03 | Disposition: A | Discharge status: Home / Self Care | Payer: Self-pay | Attending: Emergency Medicine | Admitting: Emergency Medicine

## 2023-10-03 DIAGNOSIS — Z21 Asymptomatic human immunodeficiency virus [HIV] infection status: Secondary | ICD-10-CM | POA: Insufficient documentation

## 2023-10-03 DIAGNOSIS — Z7901 Long term (current) use of anticoagulants: Secondary | ICD-10-CM | POA: Insufficient documentation

## 2023-10-03 DIAGNOSIS — D5700 Hb-SS disease with crisis, unspecified: Secondary | ICD-10-CM | POA: Insufficient documentation

## 2023-10-03 LAB — COMPREHENSIVE METABOLIC PANEL WITH GFR
ALT: 15 U/L (ref 0–44)
AST: 26 U/L (ref 15–41)
Albumin: 3.8 g/dL (ref 3.5–5.0)
Alkaline Phosphatase: 56 U/L (ref 38–126)
Anion gap: 9 (ref 5–15)
BUN: 10 mg/dL (ref 6–20)
CO2: 23 mmol/L (ref 22–32)
Calcium: 8.8 mg/dL — ABNORMAL LOW (ref 8.9–10.3)
Chloride: 108 mmol/L (ref 98–111)
Creatinine, Ser: 0.91 mg/dL (ref 0.61–1.24)
GFR, Estimated: >60 mL/min (ref >60)
Glucose, Bld: 95 mg/dL (ref 70–99)
Potassium: 3.8 mmol/L (ref 3.5–5.1)
Sodium: 140 mmol/L (ref 135–145)
Total Bilirubin: 5.3 mg/dL — ABNORMAL HIGH (ref 0.0–1.2)
Total Protein: 8.2 g/dL — ABNORMAL HIGH (ref 6.5–8.1)

## 2023-10-03 LAB — CBC
HCT: 22.7 % — ABNORMAL LOW (ref 39.0–52.0)
Hemoglobin: 7.9 g/dL — ABNORMAL LOW (ref 13.0–17.0)
MCH: 38 pg — ABNORMAL HIGH (ref 26.0–34.0)
MCHC: 34.8 g/dL (ref 30.0–36.0)
MCV: 109.1 fL — ABNORMAL HIGH (ref 80.0–100.0)
Platelets: 264 10*3/uL (ref 150–400)
RBC: 2.08 MIL/uL — ABNORMAL LOW (ref 4.22–5.81)
RDW: 25.5 % — ABNORMAL HIGH (ref 11.5–15.5)
WBC: 10.2 10*3/uL (ref 4.0–10.5)
nRBC: 34.7 % — ABNORMAL HIGH (ref 0.0–0.2)

## 2023-10-03 LAB — RETICULOCYTES
Immature Retic Fract: 27.7 % — ABNORMAL HIGH (ref 2.3–15.9)
RBC.: 1.74 MIL/uL — ABNORMAL LOW (ref 4.22–5.81)
Retic Count, Absolute: 378 10*3/uL — ABNORMAL HIGH (ref 19.0–186.0)
Retic Ct Pct: 21.7 % — ABNORMAL HIGH (ref 0.4–3.1)

## 2023-10-03 MED ORDER — OXYCODONE-ACETAMINOPHEN 5-325 MG PO TABS
2.0000 | ORAL_TABLET | Freq: Once | ORAL | Status: AC
  Administered 2023-10-03: 2 via ORAL
  Filled 2023-10-03: qty 2

## 2023-10-03 MED ORDER — HYDROMORPHONE HCL 1 MG/ML IJ SOLN
2.0000 mg | Freq: Once | INTRAMUSCULAR | Status: AC
  Administered 2023-10-03: 2 mg via INTRAVENOUS
  Filled 2023-10-03: qty 2

## 2023-10-03 MED ORDER — ACETAMINOPHEN 500 MG PO TABS
1000.0000 mg | ORAL_TABLET | Freq: Once | ORAL | Status: AC
  Administered 2023-10-03: 1000 mg via ORAL
  Filled 2023-10-03: qty 2

## 2023-10-03 MED ORDER — DIPHENHYDRAMINE HCL 50 MG/ML IJ SOLN
25.0000 mg | Freq: Once | INTRAMUSCULAR | Status: AC
  Administered 2023-10-03: 25 mg via INTRAVENOUS
  Filled 2023-10-03: qty 1

## 2023-10-03 MED ORDER — HEPARIN SOD (PORK) LOCK FLUSH 100 UNIT/ML IV SOLN
500.0000 [IU] | Freq: Once | INTRAVENOUS | Status: AC
  Administered 2023-10-03: 500 [IU]
  Filled 2023-10-03: qty 5

## 2023-10-03 NOTE — Discharge Instructions (Addendum)
 It was a pleasure taking part in your care. Please continue taking all medications as prescribed.  Please follow-up with sickle cell day clinic.  Return to the ED with any new or worsening symptoms.

## 2023-10-03 NOTE — ED Provider Notes (Signed)
 [place] EMERGENCY DEPARTMENT AT [facility] Provider Note   CSN: [id number] Arrival date & time: 10/03/23  0129     Patient presents with: Sickle Cell Pain Crisis   Martin [name] is a 33 y.o. male with history of HIV, sickle cell disease.  Patient presents to the ED for evaluation of sickle cell crisis.  Patient reports that beginning this morning he developed a sickle cell pain crisis with pain primarily in his low back.  He reports this is typically where his pain is with sickle cell.  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  He states he takes 10 mg oxycodone  4 times a day and this is not relieving pain at home.  He denies fevers, nausea, vomiting, chest pain or shortness of breath.  Denies lower extremity weakness.  Patient was last admitted to the hospital 08/18/23.    Sickle Cell Pain Crisis      Prior to Admission medications   Medication Sig Start Date End Date Taking? Authorizing Provider  abacavir -dolutegravir -lamiVUDine  (TRIUMEQ ) 600-50-300 MG tablet Take 1 tablet by mouth daily. 08/13/23   Colin Dawley, MD  albuterol  (VENTOLIN  HFA) 108 (90 Base) MCG/ACT inhaler Inhale 2 puffs into the lungs every 6 (six) hours as needed for wheezing or shortness of breath. 08/13/23   Quintella Buck, Courage, MD  apixaban  (ELIQUIS ) 5 MG TABS tablet Take 1 tablet (5 mg total) by mouth 2 (two) times daily. 08/13/23   Colin Dawley, MD  Cholecalciferol  (VITAMIN D3) 125 MCG (5000 UT) CAPS Take 5,000 Units by mouth daily.    [provider]  cyclobenzaprine  (FLEXERIL ) 10 MG tablet Take 10 mg by mouth 2 (two) times daily as needed for muscle spasms. 12/22/22   [provider]  Deferiprone  (FERRIPROX ) 500 MG TABS Take 1 tablet by mouth daily. 08/13/23   Colin Dawley, MD  diphenhydrAMINE  (BENADRYL ) 25 MG tablet Take 1 tablet (25 mg total) by mouth every 4 (four) hours as needed for itching.  08/13/23   Colin Dawley, MD  folic acid  (FOLVITE ) 400 MCG tablet Take 1 tablet (400 mcg total) by mouth daily. 08/13/23   Colin Dawley, MD  gabapentin  (NEURONTIN ) 300 MG capsule Take 1 capsule (300 mg total) by mouth at bedtime as needed (nerve pain). 08/13/23   Colin Dawley, MD  guaiFENesin  (MUCINEX ) 600 MG 12 hr tablet Take 1 tablet (600 mg total) by mouth 2 (two) times daily. 08/13/23 08/12/24  Colin Dawley, MD  hydroxyurea  (HYDREA ) 500 MG capsule Take 4 capsules (2,000 mg total) by mouth at bedtime. 08/13/23   Colin Dawley, MD  NARCAN  4 MG/0.1ML LIQD nasal spray kit Place 1 spray into the nose once as needed (as directed). 01/07/23   [provider]  oxyCODONE -acetaminophen  (PERCOCET) 10-325 MG tablet Take 1 tablet by mouth every 4 (four) hours as needed for pain. 09/27/23   [name], [name], [name]  pantoprazole  (PROTONIX ) 40 MG tablet Take 1 tablet (40 mg total) by mouth daily as needed (acid reflux). 08/13/23   Colin Dawley, MD  senna-docusate (SENOKOT-S) 8.6-50 MG tablet Take 2 tablets by mouth at bedtime. 08/13/23   Colin Dawley, MD  triamcinolone  cream (KENALOG ) 0.1 % Apply 1 Application topically 2 (two) times daily. Patient taking differently: Apply 1 Application topically 2 (two) times daily as needed (for itching). 01/12/23   Sigurd Driver, [name]    Allergies: Ketorolac  tromethamine , Tape, and Wound dressing adhesive  Review of Systems  Musculoskeletal:  Positive for back pain.  All other systems reviewed and are negative.   Updated Vital Signs BP 114/67   Pulse 77   Temp 98.1 F (36.7 C) (Oral)   Resp 16   Ht 6' 3 (1.905 m)   Wt 63.5 kg   SpO2 92%   BMI 17.50 kg/m   Physical Exam Vitals and nursing note reviewed.  Constitutional:      General: He is not in acute distress.    Appearance: He is well-developed.  HENT:     Head: Normocephalic and atraumatic.   Eyes:     Conjunctiva/sclera: Conjunctivae normal.    Cardiovascular:      Rate and Rhythm: Normal rate and regular rhythm.     Heart sounds: No murmur heard. Pulmonary:     Effort: Pulmonary effort is normal. No respiratory distress.     Breath sounds: Normal breath sounds.  Abdominal:     Palpations: Abdomen is soft.     Tenderness: There is no abdominal tenderness.   Musculoskeletal:        General: No swelling.     Cervical back: Neck supple.       Back:   Skin:    General: Skin is warm and dry.     Capillary Refill: Capillary refill takes less than 2 seconds.   Neurological:     Mental Status: He is alert and oriented to person, place, and time. Mental status is at baseline.     Comments: 5 out of 5 strength bilateral lower extremities  Psychiatric:        Mood and Affect: Mood normal.     (all labs ordered are listed, but only abnormal results are displayed) Labs Reviewed  CBC - Abnormal; Notable for the following components:      Result Value   RBC 2.08 (*)    Hemoglobin 7.9 (*)    HCT 22.7 (*)    MCV 109.1 (*)    MCH 38.0 (*)    RDW 25.5 (*)    nRBC 34.7 (*)    All other components within normal limits  COMPREHENSIVE METABOLIC PANEL WITH GFR - Abnormal; Notable for the following components:   Calcium  8.8 (*)    Total Protein 8.2 (*)    Total Bilirubin 5.3 (*)    All other components within normal limits  RETICULOCYTES - Abnormal; Notable for the following components:   Retic Ct Pct 21.7 (*)    RBC. 1.74 (*)    Retic Count, Absolute 378.0 (*)    Immature Retic Fract 27.7 (*)    All other components within normal limits    EKG: None  Radiology: No results found.   Procedures   Medications Ordered in the ED  HYDROmorphone  (DILAUDID ) injection 2 mg (2 mg Intravenous Given 10/03/23 0323)  acetaminophen  (TYLENOL ) tablet 1,000 mg (1,000 mg Oral Given 10/03/23 0325)  diphenhydrAMINE  (BENADRYL ) injection 25 mg (25 mg Intravenous Given 10/03/23 0323)  oxyCODONE -acetaminophen  (PERCOCET/ROXICET) 5-325 MG per tablet 2 tablet (2  tablets Oral Given 10/03/23 0325)  HYDROmorphone  (DILAUDID ) injection 2 mg (2 mg Intravenous Given 10/03/23 0436)  HYDROmorphone  (DILAUDID ) injection 2 mg (2 mg Intravenous Given 10/03/23 0536)    Medical Decision Making Amount and/or Complexity of Data Reviewed Labs: ordered.  Risk OTC drugs. Prescription drug management.   33 year old male presents for evaluation.  Please see HPI for further details.  On examination the patient is afebrile and nontachycardic.  Lung sounds are clear  bilaterally, he is not hypoxic.  Abdomen soft and compressible.  Neurological examination at baseline with 5 out of 5 strength bilateral lower extremities.  Tenderness to entire low back.  Patient will be assessed with CBC, CMP, reticulocytes.  Patient provided with 45 mg IV Benadryl , 1 g Tylenol , 10 mg oxycodone  oral, 2 mg Dilaudid .  Patient CBC with no leukocytosis, baseline hemoglobin 7.9.  Metabolic panel unremarkable without electrolyte derangement, no elevated LFTs, anion gap 9.  Reticulocytes at baseline.  Update: Patient reassessed and states pain still uncontrolled.  Additional 2 mg Dilaudid  administered.  This was completed at 425.  Update: Patient reassessed, continues to complain of pain.  Patient provided with 2 mg Dilaudid  at 5:36 AM. Update: Patient reassessed, states at this time he feels comfortable going home.  Reports pain well-controlled.  Admission was offered however patient declines.  Patient advised to follow-up with sickle cell day clinic.  Advised to return to ED with any new or worsening symptoms.    Final diagnoses:  Sickle cell pain crisis [facility])    ED Discharge Orders     None          Kristin Peyer 10/03/23 4098    Palumbo, April, MD 10/03/23 2322

## 2023-10-03 NOTE — ED Notes (Signed)
 Port accessed

## 2023-10-03 NOTE — ED Triage Notes (Signed)
 Pt reports sickle cell pain all over for the last day. Pt wears 3L  at baseline.

## 2023-10-04 ENCOUNTER — Telehealth (HOSPITAL_COMMUNITY): Payer: Self-pay | Admitting: *Deleted

## 2023-10-04 ENCOUNTER — Telehealth: Payer: Self-pay

## 2023-10-04 NOTE — Telephone Encounter (Signed)
 Patient called requesting to come to the day hospital for sickle cell pain. Patient reports generalized pain rated 10/10. Reports taking Oxycodone  at 4:00 am. Patient was treated in the ED for pain on Sunday morning. Denies fever, chest pain, nausea, vomiting, diarrhea, abdominal pain and priapism. Admits to having transportation without driving self. Darvin England, NP notified. The day hospital is at capacity. Provider advised patient to try and manage at home with home pain medications. Patient can call back to the day hospital tomorrow morning for triage if needed. Patient advised and expresses an understanding.

## 2023-10-04 NOTE — Telephone Encounter (Signed)
 Error

## 2023-10-05 ENCOUNTER — Non-Acute Institutional Stay (HOSPITAL_COMMUNITY)
Admission: AD | Admit: 2023-10-05 | Discharge: 2023-10-05 | Disposition: A | Discharge status: Home / Self Care | Payer: Self-pay | Source: Ambulatory Visit | Attending: Internal Medicine | Admitting: Internal Medicine

## 2023-10-05 ENCOUNTER — Telehealth (HOSPITAL_COMMUNITY): Payer: Self-pay

## 2023-10-05 DIAGNOSIS — Z21 Asymptomatic human immunodeficiency virus [HIV] infection status: Secondary | ICD-10-CM | POA: Insufficient documentation

## 2023-10-05 DIAGNOSIS — F1120 Opioid dependence, uncomplicated: Secondary | ICD-10-CM | POA: Insufficient documentation

## 2023-10-05 DIAGNOSIS — D5700 Hb-SS disease with crisis, unspecified: Secondary | ICD-10-CM | POA: Insufficient documentation

## 2023-10-05 DIAGNOSIS — G894 Chronic pain syndrome: Secondary | ICD-10-CM | POA: Insufficient documentation

## 2023-10-05 DIAGNOSIS — Z86711 Personal history of pulmonary embolism: Secondary | ICD-10-CM | POA: Insufficient documentation

## 2023-10-05 LAB — CBC WITH DIFFERENTIAL/PLATELET
Abs Immature Granulocytes: 0.07 10*3/uL (ref 0.00–0.07)
Basophils Absolute: 0 10*3/uL (ref 0.0–0.1)
Basophils Relative: 1 %
Eosinophils Absolute: 0.2 10*3/uL (ref 0.0–0.5)
Eosinophils Relative: 2 %
HCT: 23.2 % — ABNORMAL LOW (ref 39.0–52.0)
Hemoglobin: 8.2 g/dL — ABNORMAL LOW (ref 13.0–17.0)
Immature Granulocytes: 1 %
Lymphocytes Relative: 41 %
Lymphs Abs: 3.6 10*3/uL (ref 0.7–4.0)
MCH: 38.5 pg — ABNORMAL HIGH (ref 26.0–34.0)
MCHC: 35.3 g/dL (ref 30.0–36.0)
MCV: 108.9 fL — ABNORMAL HIGH (ref 80.0–100.0)
Monocytes Absolute: 1.1 10*3/uL — ABNORMAL HIGH (ref 0.1–1.0)
Monocytes Relative: 14 %
Neutro Abs: 3.4 10*3/uL (ref 1.7–7.7)
Neutrophils Relative %: 41 %
Platelets: 280 10*3/uL (ref 150–400)
RBC: 2.13 MIL/uL — ABNORMAL LOW (ref 4.22–5.81)
RDW: 26.7 % — ABNORMAL HIGH (ref 11.5–15.5)
WBC: 8.4 10*3/uL (ref 4.0–10.5)
nRBC: 69.8 % — ABNORMAL HIGH (ref 0.0–0.2)

## 2023-10-05 LAB — COMPREHENSIVE METABOLIC PANEL WITH GFR
ALT: 14 U/L (ref 0–44)
AST: 32 U/L (ref 15–41)
Albumin: 3.8 g/dL (ref 3.5–5.0)
Alkaline Phosphatase: 57 U/L (ref 38–126)
Anion gap: 10 (ref 5–15)
BUN: 10 mg/dL (ref 6–20)
CO2: 21 mmol/L — ABNORMAL LOW (ref 22–32)
Calcium: 8.8 mg/dL — ABNORMAL LOW (ref 8.9–10.3)
Chloride: 108 mmol/L (ref 98–111)
Creatinine, Ser: 0.76 mg/dL (ref 0.61–1.24)
GFR, Estimated: >60 mL/min (ref >60)
Glucose, Bld: 92 mg/dL (ref 70–99)
Potassium: 4.2 mmol/L (ref 3.5–5.1)
Sodium: 139 mmol/L (ref 135–145)
Total Bilirubin: 8 mg/dL — ABNORMAL HIGH (ref 0.0–1.2)
Total Protein: 8 g/dL (ref 6.5–8.1)

## 2023-10-05 LAB — RETICULOCYTES
Immature Retic Fract: 30.1 % — ABNORMAL HIGH (ref 2.3–15.9)
RBC.: 2.24 MIL/uL — ABNORMAL LOW (ref 4.22–5.81)
Retic Count, Absolute: 529.6 10*3/uL — ABNORMAL HIGH (ref 19.0–186.0)
Retic Ct Pct: 23.7 % — ABNORMAL HIGH (ref 0.4–3.1)

## 2023-10-05 LAB — LACTATE DEHYDROGENASE: LDH: 453 U/L — ABNORMAL HIGH (ref 98–192)

## 2023-10-05 MED ORDER — NALOXONE HCL 0.4 MG/ML IJ SOLN: 0.4000 mg | INTRAMUSCULAR | Status: DC | PRN

## 2023-10-05 MED ORDER — ACETAMINOPHEN 500 MG PO TABS: 1000.0000 mg | ORAL_TABLET | Freq: Once | ORAL | Status: DC

## 2023-10-05 MED ORDER — DIPHENHYDRAMINE HCL 25 MG PO CAPS: 25.0000 mg | ORAL_CAPSULE | ORAL | Status: DC | PRN

## 2023-10-05 MED ORDER — SENNOSIDES-DOCUSATE SODIUM 8.6-50 MG PO TABS: 1.0000 | ORAL_TABLET | Freq: Two times a day (BID) | ORAL | Status: DC

## 2023-10-05 MED ORDER — HYDROMORPHONE 1 MG/ML IV SOLN
INTRAVENOUS | Status: DC
  Administered 2023-10-05: 30 mg via INTRAVENOUS
  Administered 2023-10-05: 13 mg via INTRAVENOUS
  Filled 2023-10-05: qty 30

## 2023-10-05 MED ORDER — SODIUM CHLORIDE 0.9% FLUSH: 9.0000 mL | INTRAVENOUS | Status: DC | PRN

## 2023-10-05 MED ORDER — SODIUM CHLORIDE 0.45 % IV SOLN: INTRAVENOUS | Status: DC

## 2023-10-05 MED ORDER — ONDANSETRON HCL 4 MG/2ML IJ SOLN: 4.0000 mg | Freq: Four times a day (QID) | INTRAMUSCULAR | Status: DC | PRN

## 2023-10-05 MED ORDER — HEPARIN SOD (PORK) LOCK FLUSH 100 UNIT/ML IV SOLN
500.0000 [IU] | INTRAVENOUS | Status: AC | PRN
  Administered 2023-10-05: 500 [IU]
  Filled 2023-10-05: qty 5

## 2023-10-05 MED ORDER — POLYETHYLENE GLYCOL 3350 17 G PO PACK: 17.0000 g | PACK | Freq: Every day | ORAL | Status: DC | PRN

## 2023-10-05 MED ORDER — SODIUM CHLORIDE 0.9% FLUSH
10.0000 mL | INTRAVENOUS | Status: AC | PRN
  Administered 2023-10-05: 10 mL

## 2023-10-05 NOTE — Discharge Summary (Signed)
 Physician Discharge Summary  Martin [name] ZOX:[id number] DOB: 02/16/1991 DOA: 10/05/2023  PCP: [name], [name], [name]  Admit date: 10/05/2023  Discharge date: 10/05/2023  Time spent: 30 minutes  Discharge Diagnoses:  Active Problems:   Sickle cell anemia with pain [facility])   Discharge Condition: Stable  Diet recommendation: Regular  History of present illness:  Martin [name] is a 33 y.o. male with history of sickle cell disease,  chronic pain syndrome, opiate dependency and tolerance, history of HIV, and history of PE on Xarelto.  Patient presents to the day hospital with generalize body pain and lower extremity pain. Patient reports that he has been taking his oxycodone  at home without improvement. He is on 3l oxygen  at home. Denies cough, fever, chest pain, diarrhea /vomiting. He has had no sick contacts.   Hospital Course:  Martin [name] was admitted to the day hospital with sickle cell painful crisis. Patient was treated with IV fluid, weight based IV Dilaudid  PCA, IV Toradol , clinician assisted doses as deemed appropriate, and other adjunct therapies per sickle cell pain management protocol. Martin [name] showed significant improvement symptomatically, pain improved from 10/10 to 6/10 at the time of discharge. Patient was discharged home in a hemodynamically stable condition. Martin [name] will follow-up at the clinic as previously scheduled, continue with home medications as per prior to admission.  Discharge Instructions We discussed the need for good hydration, monitoring of hydration status, avoidance of heat, cold, stress, and infection triggers. We discussed the need to be compliant with taking Hydrea  and other home medications. Martin [name] of the need to seek medical attention immediately if any symptom of bleeding, anemia, or infection occurs.  Discharge Exam: Vitals:   10/05/23 1528 10/05/23 1533  BP: 113/72   Pulse: 86   Resp: 14 14  Temp:    SpO2:  92% 92%    General appearance: alert, cooperative and no distress Eyes: conjunctivae/corneas clear. PERRL, EOM's intact. Fundi benign. Neck: no adenopathy, no carotid bruit, no JVD, supple, symmetrical, trachea midline and thyroid not enlarged, symmetric, no tenderness/mass/nodules Back: symmetric, no curvature. ROM normal. No CVA tenderness. Resp: clear to auscultation bilaterally Chest wall: no tenderness Cardio: regular rate and rhythm, S1, S2 normal, no murmur, click, rub or gallop GI: soft, non-tender; bowel sounds normal; no masses,  no organomegaly Extremities: extremities normal, atraumatic, no cyanosis or edema Pulses: 2+ and symmetric Skin: Skin color, texture, turgor normal. No rashes or lesions Neurologic: Grossly normal  Discharge Instructions     Call MD for:  severe uncontrolled pain   Complete by: As directed    Call MD for:  temperature >100.4   Complete by: As directed    Diet - low sodium heart healthy   Complete by: As directed    Increase activity slowly   Complete by: As directed       Allergies as of 10/05/2023       Reactions   Ketorolac  Tromethamine  Swelling, Other (See Comments)   Patient reports facial edema and left arm edema after administration.   Tape Rash, Other (See Comments)   PLEASE DO NOT USE THE CLEAR, THICK, PLASTIC TAPE- only paper tape is tolerated    Wound Dressing Adhesive Rash        Medication List     TAKE these medications    albuterol  108 (90 Base) MCG/ACT inhaler Commonly known as: VENTOLIN  HFA Inhale 2 puffs into the lungs every 6 (six) hours as needed for wheezing or shortness of breath.  cyclobenzaprine  10 MG tablet Commonly known as: FLEXERIL  Take 10 mg by mouth 2 (two) times daily as needed for muscle spasms.   Deferiprone  500 MG Tabs Commonly known as: Ferriprox  Take 1 tablet by mouth daily.   diphenhydrAMINE  25 MG tablet Commonly known as: BENADRYL  Take 1 tablet (25 mg total) by mouth every 4 (four)  hours as needed for itching.   Eliquis  5 MG Tabs tablet Generic drug: apixaban  Take 1 tablet (5 mg total) by mouth 2 (two) times daily.   folic acid  400 MCG tablet Commonly known as: FOLVITE  Take 1 tablet (400 mcg total) by mouth daily.   gabapentin  300 MG capsule Commonly known as: NEURONTIN  Take 1 capsule (300 mg total) by mouth at bedtime as needed (nerve pain).   guaiFENesin  600 MG 12 hr tablet Commonly known as: Mucinex  Take 1 tablet (600 mg total) by mouth 2 (two) times daily.   hydroxyurea  500 MG capsule Commonly known as: HYDREA  Take 4 capsules (2,000 mg total) by mouth at bedtime.   Narcan  4 MG/0.1ML Liqd nasal spray kit Generic drug: naloxone  Place 1 spray into the nose once as needed (as directed).   oxyCODONE -acetaminophen  10-325 MG tablet Commonly known as: PERCOCET Take 1 tablet by mouth every 4 (four) hours as needed for pain.   pantoprazole  40 MG tablet Commonly known as: PROTONIX  Take 1 tablet (40 mg total) by mouth daily as needed (acid reflux).   senna-docusate 8.6-50 MG tablet Commonly known as: Senokot-S Take 2 tablets by mouth at bedtime.   triamcinolone  cream 0.1 % Commonly known as: KENALOG  Apply 1 Application topically 2 (two) times daily. What changed:  when to take this reasons to take this   Triumeq  600-50-300 MG tablet Generic drug: abacavir -dolutegravir -lamiVUDine  Take 1 tablet by mouth daily.   Vitamin D3 125 MCG (5000 UT) Caps Take 5,000 Units by mouth daily.       Allergies  Allergen Reactions   Ketorolac  Tromethamine  Swelling and Other (See Comments)    Patient reports facial edema and left arm edema after administration.   Tape Rash and Other (See Comments)    PLEASE DO NOT USE THE CLEAR, THICK, PLASTIC TAPE- only paper tape is tolerated    Wound Dressing Adhesive Rash     Significant Diagnostic Studies: No results found.  Signed:  Lorel Roes NP   10/05/2023, 3:35 PM

## 2023-10-05 NOTE — Progress Notes (Signed)
 Pt is admitted to the day hospital today for sickle cell pain treatment. On arrival, pt rates 10/10 generalized pain. Pt received Dilaudid  PCA and hydrated with IV fluids via PAC. At discharge, pt rates pain 5/10. PAC de-accessed and flushed with 0.9% Sodium Chloride  and Heparin , site is covered with a a band-aid. AVS offered, but pt declined. Pt is alert, oriented, and ambulatory at discharge.

## 2023-10-05 NOTE — Telephone Encounter (Signed)
 Pt called the day hospital wanting to come in today for sickle cell pain treatment. Pt reports 10/10 generalized pain. Pt reports taking Percocet 10 mg at 6 AM. Pt reports being in the ER a couple of days ago. Pt screened for COVID and denies symptoms and exposures. Pt denies fever, chest pain, N/V/D, abdominal pain, and priapism. Marylu Soda, NP notified and per provider pt can come to the day hospital today. Pt notified and verbalized understanding. Per pt he will have a family member be his transportation today,.

## 2023-10-05 NOTE — H&P (Signed)
 [facility] History and Physical  Martin [name]:[id number] DOB: 06-16-1990 DOA: 10/05/2023  PCP: [name], [name], [name]   Chief Complaint:  Chief Complaint  Patient presents with   Sickle Cell Pain Crisis    HPI: Martin [name] is a 33 y.o. male with history of sickle cell disease,  chronic pain syndrome, opiate dependency and tolerance, history of HIV, and history of PE on Xarelto.  Patient presents to the day hospital with generalize body pain and lower extremity pain. Patient reports that he has been taking his oxycodone  at home without improvement. He is on 3l oxygen  at home. Denies cough, fever, chest pain, diarrhea /vomiting. He has had no sick contacts.       Systemic Review: General: The patient denies anorexia, fever, weight loss Cardiac: Denies chest pain, syncope, palpitations, pedal edema  Respiratory: Denies cough, shortness of breath, wheezing GI: Denies severe indigestion/heartburn, abdominal pain, nausea, vomiting, diarrhea and constipation GU: Denies hematuria, incontinence, dysuria  Musculoskeletal: Lower extremity and lower back tenderness Skin: Denies suspicious skin lesions Neurologic: Denies focal weakness or numbness, change in vision  Past Medical History:  Diagnosis Date   Anxiety    HIV (human immunodeficiency virus infection) (HCC)    Proteinuria    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HCC)    Vitamin D  deficiency 10/2018    Past Surgical History:  Procedure Laterality Date   IR IMAGING GUIDED PORT INSERTION  08/29/2019   IR REMOVAL TUN ACCESS W/ PORT W/O FL MOD SED  08/30/2020   TEE WITHOUT CARDIOVERSION N/A 08/30/2020   Procedure: TRANSESOPHAGEAL ECHOCARDIOGRAM (TEE);  Surgeon: Hazle Lites, MD;  Location: [facility] ENDOSCOPY;  Service: Cardiovascular;  Laterality: N/A;    Allergies  Allergen Reactions   Ketorolac  Tromethamine  Swelling and Other (See Comments)    Patient reports facial edema and left arm edema after administration.    Tape Rash and Other (See Comments)    PLEASE DO NOT USE THE CLEAR, THICK, PLASTIC TAPE- only paper tape is tolerated    Wound Dressing Adhesive Rash    Family History  Problem Relation Age of Onset   Sickle cell trait Mother    Sickle cell trait Father    Birth defects Maternal Grandmother    Birth defects Paternal Grandmother       Prior to Admission medications   Medication Sig Start Date End Date Taking? Authorizing Provider  abacavir -dolutegravir -lamiVUDine  (TRIUMEQ ) 600-50-300 MG tablet Take 1 tablet by mouth daily. 08/13/23   Colin Dawley, MD  albuterol  (VENTOLIN  HFA) 108 (90 Base) MCG/ACT inhaler Inhale 2 puffs into the lungs every 6 (six) hours as needed for wheezing or shortness of breath. 08/13/23   Colin Dawley, MD  apixaban  (ELIQUIS ) 5 MG TABS tablet Take 1 tablet (5 mg total) by mouth 2 (two) times daily. 08/13/23   Colin Dawley, MD  Cholecalciferol  (VITAMIN D3) 125 MCG (5000 UT) CAPS Take 5,000 Units by mouth daily.    [provider]  cyclobenzaprine  (FLEXERIL ) 10 MG tablet Take 10 mg by mouth 2 (two) times daily as needed for muscle spasms. 12/22/22   [provider]  Deferiprone  (FERRIPROX ) 500 MG TABS Take 1 tablet by mouth daily. 08/13/23   Colin Dawley, MD  diphenhydrAMINE  (BENADRYL ) 25 MG tablet Take 1 tablet (25 mg total) by mouth every 4 (four) hours as needed for itching. 08/13/23   Colin Dawley, MD  folic acid  (FOLVITE ) 400 MCG tablet Take 1 tablet (400 mcg total) by mouth daily. 08/13/23  Colin Dawley, MD  gabapentin  (NEURONTIN ) 300 MG capsule Take 1 capsule (300 mg total) by mouth at bedtime as needed (nerve pain). 08/13/23   Colin Dawley, MD  guaiFENesin  (MUCINEX ) 600 MG 12 hr tablet Take 1 tablet (600 mg total) by mouth 2 (two) times daily. 08/13/23 08/12/24  Colin Dawley, MD  hydroxyurea  (HYDREA ) 500 MG capsule Take 4 capsules (2,000 mg total) by mouth at bedtime. 08/13/23   Colin Dawley, MD  NARCAN  4 MG/0.1ML LIQD  nasal spray kit Place 1 spray into the nose once as needed (as directed). 01/07/23   [provider]  oxyCODONE -acetaminophen  (PERCOCET) 10-325 MG tablet Take 1 tablet by mouth every 4 (four) hours as needed for pain. 09/27/23   [name], [name], [name]  pantoprazole  (PROTONIX ) 40 MG tablet Take 1 tablet (40 mg total) by mouth daily as needed (acid reflux). 08/13/23   Colin Dawley, MD  senna-docusate (SENOKOT-S) 8.6-50 MG tablet Take 2 tablets by mouth at bedtime. 08/13/23   Colin Dawley, MD  triamcinolone  cream (KENALOG ) 0.1 % Apply 1 Application topically 2 (two) times daily. Patient taking differently: Apply 1 Application topically 2 (two) times daily as needed (for itching). 01/12/23   Sigurd Driver, [name]     Physical Exam: Vitals:   10/05/23 0957 10/05/23 1011  BP: (!) 98/59   Pulse: 84   Resp: 16 20  Temp: 98.7 F (37.1 C)   TempSrc: Temporal   SpO2: 91%     General: Alert, awake, afebrile, anicteric, not in obvious distress HEENT: Normocephalic and Atraumatic, Mucous membranes pink                PERRLA; EOM intact; No scleral icterus,                 Nares: Patent, Oropharynx: Clear, Fair Dentition                 Neck: FROM, no cervical lymphadenopathy, thyromegaly, carotid bruit or JVD;  CHEST WALL: No tenderness  CHEST: Normal respiration, clear to auscultation bilaterally  HEART: Regular rate and rhythm; no murmurs rubs or gallops  BACK: No kyphosis or scoliosis; no CVA tenderness  ABDOMEN: Positive Bowel Sounds, soft, non-tender; no masses, no organomegaly EXTREMITIES: No cyanosis, clubbing, or edema SKIN:  no rash or ulceration  CNS: Alert and Oriented x 4, Nonfocal exam, CN 2-12 intact  Labs on Admission:  Basic Metabolic Panel: Recent Labs  Lab 10/03/23 0335  NA 140  K 3.8  CL 108  CO2 23  GLUCOSE 95  BUN 10  CREATININE 0.91  CALCIUM  8.8*   Liver Function Tests: Recent Labs  Lab 10/03/23 0335  AST 26  ALT 15  ALKPHOS 56  BILITOT  5.3*  PROT 8.2*  ALBUMIN 3.8   No results for input(s): LIPASE, AMYLASE in the last 168 hours. No results for input(s): AMMONIA in the last 168 hours. CBC: Recent Labs  Lab 10/03/23 0335  WBC 10.2  HGB 7.9*  HCT 22.7*  MCV 109.1*  PLT 264   Cardiac Enzymes: No results for input(s): CKTOTAL, CKMB, CKMBINDEX, TROPONINI in the last 168 hours.  BNP (last 3 results) No results for input(s): BNP in the last 8760 hours.  ProBNP (last 3 results) No results for input(s): PROBNP in the last 8760 hours.  CBG: No results for input(s): GLUCAP in the last 168 hours.   Assessment/Plan Active Problems:   Sickle cell anemia with pain (HCC)  Admits to the [facility] for extended observation  IVF 0 .45% Saline @ 125 mls/hour Weight based Dilaudid  PCA started within 30 minutes of admission IV Toradol  15 mg X 1 doses Acetaminophen  1000 mg x 1 dose Labs: CBCD, CMP, Retic Count and LDH Monitor vitals very closely, Re-evaluate pain scale every hour 2 L of Oxygen  by [place] Patient will be re-evaluated for pain in the context of function and relationship to baseline as care progresses. If no significant relieve from pain (remains above 5/10) will transfer patient to inpatient services for further evaluation and management  Code Status: Full  Family Communication: None  DVT Prophylaxis: Ambulate as tolerated   Time spent: 35 Minutes  Lorel Roes NP   If 7PM-7AM, please contact night-coverage [email or web address] 10/05/2023, 10:49 AM

## 2023-10-12 ENCOUNTER — Emergency Department (HOSPITAL_COMMUNITY): Payer: Self-pay

## 2023-10-12 ENCOUNTER — Other Ambulatory Visit: Payer: Self-pay

## 2023-10-12 ENCOUNTER — Encounter (HOSPITAL_COMMUNITY): Payer: Self-pay | Admitting: Emergency Medicine

## 2023-10-12 ENCOUNTER — Inpatient Hospital Stay (HOSPITAL_COMMUNITY)
Admission: EM | Admit: 2023-10-12 | Discharge: 2023-10-19 | DRG: 812 | Disposition: A | Discharge status: Home / Self Care | Payer: Self-pay | Attending: Internal Medicine | Admitting: Internal Medicine

## 2023-10-12 DIAGNOSIS — G894 Chronic pain syndrome: Secondary | ICD-10-CM | POA: Diagnosis present

## 2023-10-12 DIAGNOSIS — Z21 Asymptomatic human immunodeficiency virus [HIV] infection status: Secondary | ICD-10-CM | POA: Diagnosis present

## 2023-10-12 DIAGNOSIS — F1120 Opioid dependence, uncomplicated: Secondary | ICD-10-CM | POA: Diagnosis present

## 2023-10-12 DIAGNOSIS — Z888 Allergy status to other drugs, medicaments and biological substances status: Secondary | ICD-10-CM

## 2023-10-12 DIAGNOSIS — Z832 Family history of diseases of the blood and blood-forming organs and certain disorders involving the immune mechanism: Secondary | ICD-10-CM

## 2023-10-12 DIAGNOSIS — F419 Anxiety disorder, unspecified: Secondary | ICD-10-CM | POA: Diagnosis present

## 2023-10-12 DIAGNOSIS — D5700 Hb-SS disease with crisis, unspecified: Principal | ICD-10-CM | POA: Diagnosis present

## 2023-10-12 DIAGNOSIS — J9611 Chronic respiratory failure with hypoxia: Secondary | ICD-10-CM | POA: Diagnosis present

## 2023-10-12 DIAGNOSIS — D72829 Elevated white blood cell count, unspecified: Secondary | ICD-10-CM | POA: Diagnosis present

## 2023-10-12 DIAGNOSIS — Z86711 Personal history of pulmonary embolism: Secondary | ICD-10-CM

## 2023-10-12 DIAGNOSIS — Z79899 Other long term (current) drug therapy: Secondary | ICD-10-CM

## 2023-10-12 DIAGNOSIS — Z7901 Long term (current) use of anticoagulants: Secondary | ICD-10-CM

## 2023-10-12 DIAGNOSIS — Z9109 Other allergy status, other than to drugs and biological substances: Secondary | ICD-10-CM

## 2023-10-12 LAB — CBC WITH DIFFERENTIAL/PLATELET
Abs Immature Granulocytes: 0.02 10*3/uL (ref 0.00–0.07)
Basophils Absolute: 0 10*3/uL (ref 0.0–0.1)
Basophils Relative: 1 %
Eosinophils Absolute: 0.1 10*3/uL (ref 0.0–0.5)
Eosinophils Relative: 2 %
HCT: 12.5 % — ABNORMAL LOW (ref 39.0–52.0)
Hemoglobin: 7.5 g/dL — ABNORMAL LOW (ref 13.0–17.0)
Immature Granulocytes: 0 %
Lymphocytes Relative: 51 %
Lymphs Abs: 4.5 10*3/uL — ABNORMAL HIGH (ref 0.7–4.0)
MCH: 64.1 pg — ABNORMAL HIGH (ref 26.0–34.0)
MCHC: 60 g/dL — ABNORMAL HIGH (ref 30.0–36.0)
MCV: 106.8 fL — ABNORMAL HIGH (ref 80.0–100.0)
Monocytes Absolute: 0.9 10*3/uL (ref 0.1–1.0)
Monocytes Relative: 10 %
Neutro Abs: 3.2 10*3/uL (ref 1.7–7.7)
Neutrophils Relative %: 36 %
Platelets: 331 10*3/uL (ref 150–400)
RBC: 1.17 MIL/uL — ABNORMAL LOW (ref 4.22–5.81)
RDW: 30.3 % — ABNORMAL HIGH (ref 11.5–15.5)
WBC: 8.7 10*3/uL (ref 4.0–10.5)
nRBC: 33.9 % — ABNORMAL HIGH (ref 0.0–0.2)

## 2023-10-12 LAB — COMPREHENSIVE METABOLIC PANEL WITH GFR
ALT: 19 U/L (ref 0–44)
AST: 34 U/L (ref 15–41)
Albumin: 3.4 g/dL — ABNORMAL LOW (ref 3.5–5.0)
Alkaline Phosphatase: 55 U/L (ref 38–126)
Anion gap: 8 (ref 5–15)
BUN: 12 mg/dL (ref 6–20)
CO2: 23 mmol/L (ref 22–32)
Calcium: 8.2 mg/dL — ABNORMAL LOW (ref 8.9–10.3)
Chloride: 106 mmol/L (ref 98–111)
Creatinine, Ser: 0.74 mg/dL (ref 0.61–1.24)
GFR, Estimated: >60 mL/min (ref >60)
Glucose, Bld: 96 mg/dL (ref 70–99)
Potassium: 3.5 mmol/L (ref 3.5–5.1)
Sodium: 137 mmol/L (ref 135–145)
Total Bilirubin: 11.5 mg/dL — ABNORMAL HIGH (ref 0.0–1.2)
Total Protein: 7.6 g/dL (ref 6.5–8.1)

## 2023-10-12 LAB — TROPONIN I (HIGH SENSITIVITY)
Troponin I (High Sensitivity): 14 ng/L (ref <18)
Troponin I (High Sensitivity): 14 ng/L (ref <18)

## 2023-10-12 LAB — RETICULOCYTES
Immature Retic Fract: 19.9 % — ABNORMAL HIGH (ref 2.3–15.9)
RBC.: 1.68 MIL/uL — ABNORMAL LOW (ref 4.22–5.81)
Retic Count, Absolute: 249 10*3/uL — ABNORMAL HIGH (ref 19.0–186.0)
Retic Ct Pct: 14.8 % — ABNORMAL HIGH (ref 0.4–3.1)

## 2023-10-12 MED ORDER — HYDROMORPHONE HCL 1 MG/ML IJ SOLN
2.0000 mg | Freq: Once | INTRAMUSCULAR | Status: AC
  Administered 2023-10-12: 2 mg via INTRAVENOUS
  Filled 2023-10-12: qty 2

## 2023-10-12 MED ORDER — OXYCODONE-ACETAMINOPHEN 5-325 MG PO TABS
2.0000 | ORAL_TABLET | Freq: Once | ORAL | Status: AC
  Administered 2023-10-12: 2 via ORAL
  Filled 2023-10-12: qty 2

## 2023-10-12 MED ORDER — FOLIC ACID 0.5 MG HALF TAB
500.0000 ug | ORAL_TABLET | Freq: Every day | ORAL | Status: DC
  Filled 2023-10-12: qty 1

## 2023-10-12 MED ORDER — IOHEXOL 350 MG/ML SOLN
75.0000 mL | Freq: Once | INTRAVENOUS | Status: AC | PRN
Start: 2023-10-12 — End: 2023-10-12
  Administered 2023-10-12: 75 mL via INTRAVENOUS

## 2023-10-12 MED ORDER — DIPHENHYDRAMINE HCL 50 MG/ML IJ SOLN
25.0000 mg | Freq: Once | INTRAMUSCULAR | Status: AC
  Administered 2023-10-12: 25 mg via INTRAVENOUS
  Filled 2023-10-12: qty 1

## 2023-10-12 MED ORDER — HYDROMORPHONE HCL 1 MG/ML IJ SOLN
2.0000 mg | INTRAMUSCULAR | Status: AC | PRN
  Administered 2023-10-12 (×2): 2 mg via INTRAVENOUS
  Filled 2023-10-12 (×2): qty 2

## 2023-10-12 MED ORDER — DIPHENHYDRAMINE HCL 25 MG PO CAPS
25.0000 mg | ORAL_CAPSULE | ORAL | Status: DC | PRN
  Administered 2023-10-15 (×2): 25 mg via ORAL
  Filled 2023-10-12 (×4): qty 1

## 2023-10-12 MED ORDER — SODIUM CHLORIDE 0.9% FLUSH: 9.0000 mL | INTRAVENOUS | Status: DC | PRN

## 2023-10-12 MED ORDER — OXYCODONE HCL 5 MG PO TABS
5.0000 mg | ORAL_TABLET | ORAL | Status: DC | PRN
  Administered 2023-10-15 – 2023-10-19 (×15): 5 mg via ORAL
  Filled 2023-10-12 (×15): qty 1

## 2023-10-12 MED ORDER — HYDROXYUREA 500 MG PO CAPS
2000.0000 mg | ORAL_CAPSULE | Freq: Every day | ORAL | Status: DC
  Administered 2023-10-12 – 2023-10-18 (×7): 2000 mg via ORAL
  Filled 2023-10-12 (×7): qty 4

## 2023-10-12 MED ORDER — CYCLOBENZAPRINE HCL 10 MG PO TABS
10.0000 mg | ORAL_TABLET | Freq: Two times a day (BID) | ORAL | Status: DC | PRN
  Administered 2023-10-16 – 2023-10-19 (×2): 10 mg via ORAL
  Filled 2023-10-12 (×2): qty 1

## 2023-10-12 MED ORDER — POLYETHYLENE GLYCOL 3350 17 G PO PACK: 17.0000 g | PACK | Freq: Every day | ORAL | Status: DC | PRN

## 2023-10-12 MED ORDER — NALOXONE HCL 4 MG/0.1ML NA LIQD: 1.0000 | Freq: Once | NASAL | Status: DC | PRN

## 2023-10-12 MED ORDER — DIPHENHYDRAMINE HCL 25 MG PO CAPS: 25.0000 mg | ORAL_CAPSULE | ORAL | Status: DC | PRN

## 2023-10-12 MED ORDER — GABAPENTIN 300 MG PO CAPS: 300.0000 mg | ORAL_CAPSULE | Freq: Every evening | ORAL | Status: DC | PRN

## 2023-10-12 MED ORDER — ONDANSETRON HCL 4 MG PO TABS: 4.0000 mg | ORAL_TABLET | ORAL | Status: DC | PRN

## 2023-10-12 MED ORDER — ABACAVIR-DOLUTEGRAVIR-LAMIVUD 600-50-300 MG PO TABS
1.0000 | ORAL_TABLET | Freq: Every day | ORAL | Status: DC
Start: 2023-10-12 — End: 2023-10-19
  Administered 2023-10-12 – 2023-10-19 (×8): 1 via ORAL
  Filled 2023-10-12 (×8): qty 1

## 2023-10-12 MED ORDER — SODIUM CHLORIDE 0.45 % IV SOLN: INTRAVENOUS | Status: AC

## 2023-10-12 MED ORDER — ALBUTEROL SULFATE (2.5 MG/3ML) 0.083% IN NEBU: 2.5000 mg | INHALATION_SOLUTION | Freq: Four times a day (QID) | RESPIRATORY_TRACT | Status: DC | PRN

## 2023-10-12 MED ORDER — BENZOCAINE 10 % MT GEL
Freq: Three times a day (TID) | OROMUCOSAL | Status: DC | PRN
  Filled 2023-10-12: qty 9.4

## 2023-10-12 MED ORDER — HYDROMORPHONE 1 MG/ML IV SOLN
INTRAVENOUS | Status: DC
  Administered 2023-10-12: 9 mg via INTRAVENOUS
  Administered 2023-10-12: 5.5 mg via INTRAVENOUS
  Administered 2023-10-12: 3.5 mg via INTRAVENOUS
  Administered 2023-10-12: 30 mg via INTRAVENOUS
  Administered 2023-10-13: 4.5 mg via INTRAVENOUS
  Administered 2023-10-13: 7 mg via INTRAVENOUS
  Administered 2023-10-13: 4 mg via INTRAVENOUS
  Administered 2023-10-13: 0.5 mg via INTRAVENOUS
  Administered 2023-10-13: 2.7 mg via INTRAVENOUS
  Administered 2023-10-13: 30 mg via INTRAVENOUS
  Administered 2023-10-14: 7.2 mg via INTRAVENOUS
  Administered 2023-10-14: 3 mg via INTRAVENOUS
  Administered 2023-10-14 (×2): 2 mg via INTRAVENOUS
  Administered 2023-10-15: 7 mg via INTRAVENOUS
  Administered 2023-10-15: 30 mg via INTRAVENOUS
  Administered 2023-10-15: 6 mg via INTRAVENOUS
  Administered 2023-10-15: 4.5 mg via INTRAVENOUS
  Administered 2023-10-16: 30 mg via INTRAVENOUS
  Administered 2023-10-16: 6 mg via INTRAVENOUS
  Administered 2023-10-16: 6.5 mg via INTRAVENOUS
  Administered 2023-10-16: 9 mg via INTRAVENOUS
  Administered 2023-10-16: 10.5 mg via INTRAVENOUS
  Administered 2023-10-16: 1 mg via INTRAVENOUS
  Administered 2023-10-16: 6.5 mg via INTRAVENOUS
  Administered 2023-10-17: 4.5 mg via INTRAVENOUS
  Administered 2023-10-17: 5 mg via INTRAVENOUS
  Administered 2023-10-17: 3.75 mg via INTRAVENOUS
  Administered 2023-10-17: 15.5 mg via INTRAVENOUS
  Administered 2023-10-17 (×2): 30 mg via INTRAVENOUS
  Administered 2023-10-17: 5 mg via INTRAVENOUS
  Administered 2023-10-18: 7.5 mg via INTRAVENOUS
  Administered 2023-10-18: 6.5 mg via INTRAVENOUS
  Administered 2023-10-18: 3 mg via INTRAVENOUS
  Administered 2023-10-18: 7.5 mg via INTRAVENOUS
  Administered 2023-10-18: 3.5 mg via INTRAVENOUS
  Administered 2023-10-18: 2 mg via INTRAVENOUS
  Administered 2023-10-18: 30 mg via INTRAVENOUS
  Administered 2023-10-19: 6.5 mg via INTRAVENOUS
  Administered 2023-10-19: 30 mg via INTRAVENOUS
  Administered 2023-10-19: 6 mg via INTRAVENOUS
  Administered 2023-10-19: 7 mg via INTRAVENOUS
  Administered 2023-10-19: 6 mg via INTRAVENOUS
  Filled 2023-10-12 (×9): qty 30

## 2023-10-12 MED ORDER — DEFERIPRONE 500 MG PO TABS
1.0000 | ORAL_TABLET | Freq: Every day | ORAL | Status: DC
Start: 2023-10-12 — End: 2023-10-12

## 2023-10-12 MED ORDER — HYDROMORPHONE 1 MG/ML IV SOLN: INTRAVENOUS | Status: DC

## 2023-10-12 MED ORDER — FOLIC ACID 1 MG PO TABS
0.5000 mg | ORAL_TABLET | Freq: Every day | ORAL | Status: DC
  Administered 2023-10-12 – 2023-10-19 (×7): 0.5 mg via ORAL
  Filled 2023-10-12 (×7): qty 1

## 2023-10-12 MED ORDER — OXYCODONE-ACETAMINOPHEN 10-325 MG PO TABS: 1.0000 | ORAL_TABLET | ORAL | Status: DC | PRN

## 2023-10-12 MED ORDER — OXYCODONE-ACETAMINOPHEN 5-325 MG PO TABS
1.0000 | ORAL_TABLET | ORAL | Status: DC | PRN
  Administered 2023-10-15 – 2023-10-19 (×15): 1 via ORAL
  Filled 2023-10-12 (×15): qty 1

## 2023-10-12 MED ORDER — PANTOPRAZOLE SODIUM 40 MG PO TBEC: 40.0000 mg | DELAYED_RELEASE_TABLET | Freq: Every day | ORAL | Status: DC | PRN

## 2023-10-12 MED ORDER — SODIUM CHLORIDE 0.9 % IV BOLUS
1000.0000 mL | Freq: Once | INTRAVENOUS | Status: AC
  Administered 2023-10-12: 1000 mL via INTRAVENOUS

## 2023-10-12 MED ORDER — SODIUM CHLORIDE 0.45 % IV SOLN: INTRAVENOUS | Status: DC

## 2023-10-12 MED ORDER — ONDANSETRON HCL 4 MG/2ML IJ SOLN: 4.0000 mg | INTRAMUSCULAR | Status: DC | PRN

## 2023-10-12 MED ORDER — SENNOSIDES-DOCUSATE SODIUM 8.6-50 MG PO TABS
1.0000 | ORAL_TABLET | Freq: Two times a day (BID) | ORAL | Status: DC
  Filled 2023-10-12: qty 1

## 2023-10-12 MED ORDER — APIXABAN 5 MG PO TABS
5.0000 mg | ORAL_TABLET | Freq: Two times a day (BID) | ORAL | Status: DC
  Administered 2023-10-12 – 2023-10-19 (×15): 5 mg via ORAL
  Filled 2023-10-12 (×15): qty 1

## 2023-10-12 MED ORDER — NALOXONE HCL 0.4 MG/ML IJ SOLN: 0.4000 mg | INTRAMUSCULAR | Status: DC | PRN

## 2023-10-12 MED ORDER — ACETAMINOPHEN 325 MG PO TABS
325.0000 mg | ORAL_TABLET | ORAL | Status: AC
  Administered 2023-10-12: 325 mg via ORAL
  Filled 2023-10-12: qty 1

## 2023-10-12 MED ORDER — SENNOSIDES-DOCUSATE SODIUM 8.6-50 MG PO TABS
2.0000 | ORAL_TABLET | Freq: Every day | ORAL | Status: DC
  Filled 2023-10-12 (×6): qty 2

## 2023-10-12 NOTE — ED Notes (Signed)
 Patient refused IV or straight stick and wants Port to be access for blood work.

## 2023-10-12 NOTE — H&P (Signed)
 H&P  Patient Demographics:  Martin [name], is a 33 y.o. male  MRN: [id number]   DOB - 07-21-1990  Admit Date - 10/12/2023  Outpatient Primary MD for the patient is [name], [name], [name]  Chief Complaint  Patient presents with   Sickle Cell Pain Crisis      HPI:   Martin [name]  is a 33 y.o. male with history of sickle cell disease,  chronic pain syndrome, opiate dependency and tolerance, history of HIV, and history of PE on Xarelto.  Patient presents to the emergency department with generalize body pain and lower extremity pain. Patient reports that he has been taking his oxycodone  at home without improvement. He is on 3l oxygen  at home. Denies cough, fever, chest pain, diarrhea /vomiting. He has had no sick contacts and no recent travels  ED Course:  Patient was treated in the ED with IVF, IV pain medication with no improvement to symptoms. He is admitted in-patient for ongoing sickle cell pain management.  Blood pressure 114/78, pulse 77, temperature (!) 97.5 F (36.4 C), temperature source Oral, resp. rate 16, height 6' 3 (1.905 m), weight 64.4 kg, SpO2 93%.  Labs Reviewed  COMPREHENSIVE METABOLIC PANEL WITH GFR - Abnormal; Notable for the following components:      Result Value   Calcium  8.2 (*)    Albumin 3.4 (*)    Total Bilirubin 11.5 (*)    All other components within normal limits  CBC WITH DIFFERENTIAL/PLATELET - Abnormal; Notable for the following components:   RBC 1.17 (*)    Hemoglobin 7.5 (*)    HCT 12.5 (*)    MCV 106.8 (*)    MCH 64.1 (*)    MCHC 60.0 (*)    RDW 30.3 (*)    nRBC 33.9 (*)    Lymphs Abs 4.5 (*)    All other components within normal limits  RETICULOCYTES - Abnormal; Notable for the following components:   Retic Ct Pct 14.8 (*)    RBC. 1.68 (*)    Retic Count, Absolute 249.0 (*)    Immature Retic Fract 19.9 (*)    All other components within normal limits  CBC  CREATININE, SERUM  TROPONIN I (HIGH SENSITIVITY)  TROPONIN I (HIGH  SENSITIVITY)      Review of systems:  In addition to the HPI above, patient reports No fever or chills No Headache, No changes with vision or hearing No problems swallowing food or liquids No chest pain, cough or shortness of breath No abdominal pain, No nausea or vomiting, Bowel movements are regular No blood in stool or urine No dysuria No new skin rashes or bruises No new joints pains-aches No new weakness, tingling, numbness in any extremity No recent weight gain or loss No polyuria, polydypsia or polyphagia No significant Mental Stressors  A full 10 point Review of Systems was done, except as stated above, all other Review of Systems were negative.  With Past History of the following :   Past Medical History:  Diagnosis Date   Anxiety    HIV (human immunodeficiency virus infection) (HCC)    Proteinuria    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HCC)    Vitamin D  deficiency 10/2018      Past Surgical History:  Procedure Laterality Date   IR IMAGING GUIDED PORT INSERTION  08/29/2019   IR REMOVAL TUN ACCESS W/ PORT W/O FL MOD SED  08/30/2020   TEE WITHOUT CARDIOVERSION N/A 08/30/2020   Procedure: TRANSESOPHAGEAL ECHOCARDIOGRAM (TEE);  Surgeon: Mona Kent  C, MD;  Location: [facility];  Service: Cardiovascular;  Laterality: N/A;     Social History:   Social History   Tobacco Use   Smoking status: Never   Smokeless tobacco: Never  Substance Use Topics   Alcohol  use: No     Lives - At home   Family History :   Family History  Problem Relation Age of Onset   Sickle cell trait Mother    Sickle cell trait Father    Birth defects Maternal Grandmother    Birth defects Paternal Grandmother      Home Medications:   Prior to Admission medications   Medication Sig Start Date End Date Taking? Authorizing Provider  abacavir -dolutegravir -lamiVUDine  (TRIUMEQ ) 600-50-300 MG tablet Take 1 tablet by mouth daily. 08/13/23  Yes Pearlean Manus, MD  albuterol  (VENTOLIN  HFA) 108 (90  Base) MCG/ACT inhaler Inhale 2 puffs into the lungs every 6 (six) hours as needed for wheezing or shortness of breath. 08/13/23  Yes Emokpae, Courage, MD  apixaban  (ELIQUIS ) 5 MG TABS tablet Take 1 tablet (5 mg total) by mouth 2 (two) times daily. 08/13/23  Yes Emokpae, Courage, MD  cyclobenzaprine  (FLEXERIL ) 10 MG tablet Take 10 mg by mouth 2 (two) times daily as needed for muscle spasms. 12/22/22  Yes [provider]  diphenhydrAMINE  (BENADRYL ) 25 MG tablet Take 1 tablet (25 mg total) by mouth every 4 (four) hours as needed for itching. 08/13/23  Yes Emokpae, Courage, MD  folic acid  (FOLVITE ) 400 MCG tablet Take 1 tablet (400 mcg total) by mouth daily. 08/13/23  Yes Emokpae, Courage, MD  gabapentin  (NEURONTIN ) 300 MG capsule Take 1 capsule (300 mg total) by mouth at bedtime as needed (nerve pain). 08/13/23  Yes Emokpae, Courage, MD  guaiFENesin  (MUCINEX ) 600 MG 12 hr tablet Take 1 tablet (600 mg total) by mouth 2 (two) times daily. Patient taking differently: Take 600 mg by mouth 2 (two) times daily as needed for cough. 08/13/23 08/12/24 Yes Emokpae, Courage, MD  hydroxyurea  (HYDREA ) 500 MG capsule Take 4 capsules (2,000 mg total) by mouth at bedtime. 08/13/23  Yes Pearlean Manus, MD  NARCAN  4 MG/0.1ML LIQD nasal spray kit Place 1 spray into the nose once as needed (as directed). 01/07/23  Yes [provider]  oxyCODONE -acetaminophen  (PERCOCET) 10-325 MG tablet Take 1 tablet by mouth every 4 (four) hours as needed for pain. 09/27/23  Yes [name], [name], [name]  triamcinolone  cream (KENALOG ) 0.1 % Apply 1 Application topically 2 (two) times daily. Patient taking differently: Apply 1 Application topically 2 (two) times daily as needed (for itching). 01/12/23  Yes Tilford Bertram HERO, [name]  Deferiprone  (FERRIPROX ) 500 MG TABS Take 1 tablet by mouth daily. Patient not taking: Reported on 10/12/2023 08/13/23   Pearlean Manus, MD  pantoprazole  (PROTONIX ) 40 MG tablet Take 1 tablet (40 mg total) by  mouth daily as needed (acid reflux). Patient not taking: Reported on 10/12/2023 08/13/23   Pearlean Manus, MD  senna-docusate (SENOKOT-S) 8.6-50 MG tablet Take 2 tablets by mouth at bedtime. Patient not taking: Reported on 10/12/2023 08/13/23   Pearlean Manus, MD     Allergies:   Allergies  Allergen Reactions   Ketorolac  Tromethamine  Swelling and Other (See Comments)    Patient reports facial edema and left arm edema after administration.   Tape Rash and Other (See Comments)    PLEASE DO NOT USE THE CLEAR, THICK, PLASTIC TAPE- only paper tape is tolerated    Wound Dressing Adhesive Rash     Physical  Exam:   Vitals:   Vitals:   10/12/23 1322 10/12/23 1326  BP: 114/78   Pulse: 77   Resp: 16 16  Temp: (!) 97.5 F (36.4 C)   SpO2: 92% 93%    Physical Exam: Constitutional: Patient appears well-developed and well-nourished. Not in obvious distress. HENT: Normocephalic, atraumatic, External right and left ear normal. Oropharynx is clear and moist.  Eyes: Conjunctivae and EOM are normal. PERRLA, no scleral icterus. Neck: Normal ROM. Neck supple. No JVD. No tracheal deviation. No thyromegaly. CVS: RRR, S1/S2 +, no murmurs, no gallops, no carotid bruit.  Pulmonary: Effort and breath sounds normal, no stridor, rhonchi, wheezes, rales.  Abdominal: Soft. BS +, no distension, tenderness, rebound or guarding.  Musculoskeletal: Normal range of motion. No edema and no tenderness.  Lymphadenopathy: No lymphadenopathy noted, cervical, inguinal or axillary Neuro: Alert. Normal reflexes, muscle tone coordination. No cranial nerve deficit. Skin: Skin is warm and dry. No rash noted. Not diaphoretic. No erythema. No pallor. Psychiatric: Normal mood and affect. Behavior, judgment, thought content normal.   Data Review:   CBC Recent Labs  Lab 10/12/23 0204  WBC 8.7  HGB 7.5*  HCT 12.5*  PLT 331  MCV 106.8*  MCH 64.1*  MCHC 60.0*  RDW 30.3*  LYMPHSABS 4.5*  MONOABS 0.9  EOSABS 0.1   BASOSABS 0.0   ------------------------------------------------------------------------------------------------------------------  Chemistries  Recent Labs  Lab 10/12/23 0204  NA 137  K 3.5  CL 106  CO2 23  GLUCOSE 96  BUN 12  CREATININE 0.74  CALCIUM  8.2*  AST 34  ALT 19  ALKPHOS 55  BILITOT 11.5*   ------------------------------------------------------------------------------------------------------------------ estimated creatinine clearance is 119.6 mL/min (by C-G formula based on SCr of 0.74 mg/dL). ------------------------------------------------------------------------------------------------------------------ No results for input(s): TSH, T4TOTAL, T3FREE, THYROIDAB in the last 72 hours.  Invalid input(s): FREET3  Coagulation profile No results for input(s): INR, PROTIME in the last 168 hours. ------------------------------------------------------------------------------------------------------------------- No results for input(s): DDIMER in the last 72 hours. -------------------------------------------------------------------------------------------------------------------  Cardiac Enzymes No results for input(s): CKMB, TROPONINI, MYOGLOBIN in the last 168 hours.  Invalid input(s): CK ------------------------------------------------------------------------------------------------------------------    Component Value Date/Time   BNP 91.4 05/27/2021 2216    ---------------------------------------------------------------------------------------------------------------  Urinalysis    Component Value Date/Time   COLORURINE AMBER (A) 06/20/2023 0311   APPEARANCEUR CLOUDY (A) 06/20/2023 0311   LABSPEC 1.014 06/20/2023 0311   PHURINE 5.0 06/20/2023 0311   GLUCOSEU NEGATIVE 06/20/2023 0311   HGBUR MODERATE (A) 06/20/2023 0311   BILIRUBINUR NEGATIVE 06/20/2023 0311   BILIRUBINUR small (A) 03/18/2021 1218   BILIRUBINUR neg 11/27/2019 1149    KETONESUR NEGATIVE 06/20/2023 0311   PROTEINUR 100 (A) 06/20/2023 0311   UROBILINOGEN 2.0 (A) 03/18/2021 1218   UROBILINOGEN 4.0 (H) 05/21/2009 1944   NITRITE NEGATIVE 06/20/2023 0311   LEUKOCYTESUR NEGATIVE 06/20/2023 0311    ----------------------------------------------------------------------------------------------------------------   Imaging Results:    CT Angio Chest PE W and/or Wo Contrast Result Date: 10/12/2023 CLINICAL DATA:  Sickle cell crisis. Evaluate for pulmonary embolism. No relief of chest pain with usual home meds. EXAM: CT ANGIOGRAPHY CHEST WITH CONTRAST TECHNIQUE: Multidetector CT imaging of the chest was performed using the standard protocol during bolus administration of intravenous contrast. Multiplanar CT image reconstructions and MIPs were obtained to evaluate the vascular anatomy. RADIATION DOSE REDUCTION: This exam was performed according to the departmental dose-optimization program which includes automated exposure control, adjustment of the mA and/or kV according to patient size and/or use of iterative reconstruction technique. CONTRAST:  75mL OMNIPAQUE  IOHEXOL  350  MG/ML SOLN COMPARISON:  CTA chest 08/13/2023, chest CT without contrast 09/25/2022 FINDINGS: Cardiovascular: There is a right chest port with IJ approach catheter terminating about the superior cavoatrial junction. There is cardiomegaly with a right chamber predominance and RV/LV ratio of 1.3. There is contrast reflux into the IVC and hepatic veins which may be seen with right heart dysfunction or tricuspid regurgitation. There is a small pericardial effusion anteriorly to the right. Enlarged pulmonary trunk again measuring 3.7 cm indicating arterial hypertension. Respiratory motion, especially in the bases, largely obscures the subsegmental pulmonary arteries. No arterial embolus is seen at least to the segmental level. Pulmonary veins are nondistended. The aorta and great vessels are unremarkable.  Mediastinum/Nodes: Stable mildly prominent right hilar lymph nodes, largest 1.3 cm in short axis on 5:61. Stable subcarinal lymph nodes up to 1.4 cm. Substernal thymus appears similar. The esophagus is patulous but not thickened. There are borderline prominent bilateral axillary space lymph nodes, also unchanged. The trachea and central airways are clear.  Negative thyroid gland. Lungs/Pleura: Extensive multifocal scarring change and architectural distortion both lungs with underlying chronic ground-glass disease, with the ground-glass opacities most apparent and confluent in the lower lung fields. No new or grossly active infiltrate is seen. There is diffuse bronchial thickening. No new opacity is seen or focal nodules. No pleural effusion. No pneumothorax. Upper Abdomen: Autoinfarcted calcified and shrunken spleen. No acute abnormality. Musculoskeletal: Dense bones consistent with sickle cell disease. Mild thoracic dextroscoliosis. No acute or other significant osseous findings or chest wall abnormality. Review of the MIP images confirms the above findings. IMPRESSION: 1. No evidence of arterial embolus at least to the segmental level. Respiratory motion largely obscures the subsegmental pulmonary arteries. 2. Cardiomegaly with right chamber predominance and contrast reflux into the IVC and hepatic veins, the latter which may be seen with right heart dysfunction or tricuspid regurgitation. 3. Enlarged pulmonary trunk indicating arterial hypertension. 4. Extensive multifocal scarring change and architectural distortion both lungs with underlying chronic ground-glass disease. No new or grossly active infiltrate is seen. 5. Diffuse bronchial thickening. 6. Stable mildly prominent right hilar, subcarinal and axillary space lymph nodes. 7. Autoinfarcted calcified and shrunken spleen. 8. Dense bones consistent with sickle cell disease. Electronically Signed   By: Francis Quam M.D.   On: 10/12/2023 04:47      Assessment & Plan:  Active Problems:   Chronic pain syndrome   Anxiety   Leukocytosis   Sickle cell anemia with pain (HCC)   Sickle cell crisis (HCC)   Hb Sickle Cell Disease with crisis: Admit patient, start IVF 0.45% Saline @ 125 mls/hour, start weight based Dilaudid  PCA, start IV Toradol  15 mg Q 6 H, Restart oral home pain medications, Monitor vitals very closely, Re-evaluate pain scale regularly, 2 L of Oxygen  by [place], Patient will be re-evaluated for pain in the context of function and relationship to baseline as care progresses. Leukocytosis: WBC stable  Anemia of Chronic Disease: HGB at 7.5 slightly below patients baseline however no medical indication for transfusion. Will continue to monitor daily CBC Chronic pain Syndrome: Continue oral home medication Anxiety: Stable, continue medication as prescribed    DVT Prophylaxis: Subcut Lovenox    AM Labs Ordered, also please review Full Orders  Family Communication: Admission, patient's condition and plan of care including tests being ordered have been discussed with the patient who indicate understanding and agree with the plan and Code Status.  Code Status: Full Code  Consults called: None    Admission status: Inpatient  Time spent in minutes : 50 minutes  [name] Cover NP 10/12/2023 at 2:34 PM

## 2023-10-12 NOTE — ED Notes (Signed)
 ED TO INPATIENT HANDOFF REPORT  Name/Age/Gender Martin [name] 33 y.o. male  Code Status    Code Status Orders  (From admission, onward)           Start     Ordered   10/12/23 0904  Full code  Continuous       Question:  By:  Answer:  Consent: discussion documented in EHR   10/12/23 0908           Code Status History     Date Active Date Inactive Code Status Order ID Comments User Context   10/12/2023 0624 10/12/2023 0908 Full Code [id number]  Debby Camila LABOR, MD ED   10/05/2023 0937 10/05/2023 2144 Full Code [id number]  Cherylene [name] HERO, NP Inpatient   08/18/2023 1042 08/18/2023 2128 Full Code [id number]  Cherylene [name] HERO, NP Inpatient   08/10/2023 1106 08/13/2023 1817 Full Code [id number]  Cherylene [name] HERO, NP ED   08/09/2023 1028 08/09/2023 2130 Full Code [id number]  Cherylene [name] HERO, NP Inpatient   06/20/2023 0708 06/23/2023 0019 Full Code [id number]  Alfornia Madison, MD ED   03/16/2023 0618 03/18/2023 0044 Full Code [id number]  Lee Kingfisher, MD ED   01/28/2023 0951 01/28/2023 2139 Full Code [id number]  Tilford Bertram HERO, [name] Inpatient   10/15/2022 1100 10/15/2022 2137 Full Code [id number]  Tilford Bertram HERO, [name] Inpatient   09/25/2022 0912 10/06/2022 2032 Full Code [id number]  Tilford Bertram HERO, [name] ED   09/15/2022 0118 09/17/2022 2229 Full Code [id number]  Alfornia Madison, MD ED   07/13/2022 1417 07/18/2022 1746 Full Code [id number]  Tilford Bertram HERO, [name] ED   06/22/2022 2037 06/28/2022 2102 Full Code [id number]  Lonzell Emeline HERO, DO ED   02/19/2022 1647 02/20/2022 2158 Full Code [id number]  Tilford Bertram HERO, [name] Inpatient   02/19/2022 1250 02/19/2022 1647 Full Code [id number]  Tilford Bertram HERO, [name] Inpatient   09/01/2021 1021 09/03/2021 2058 Full Code [id number]  Tilford Bertram HERO, [name] ED   08/11/2021 0833 08/17/2021 2014 Full Code [id number]  Tilford Bertram HERO, [name] Inpatient   08/03/2021 1100 08/04/2021 0402 Full Code [id number]  Tilford Bertram HERO, [name] Inpatient   07/28/2021 1054 07/29/2021 2204  Full Code [id number]  Sim Emery CROME, MD ED   07/08/2021 0912 07/08/2021 2143 Full Code [id number]  Tilford Bertram HERO, [name] Inpatient   06/24/2021 1612 06/30/2021 1842 Full Code [id number]  Tilford Bertram HERO, [name] Inpatient   06/24/2021 1043 06/24/2021 1612 Full Code [id number]  Tilford Bertram HERO, [name] Inpatient   05/26/2021 1158 05/31/2021 0147 Full Code [id number]  Tilford Bertram HERO, [name] ED   04/07/2021 1619 04/13/2021 2105 Full Code [id number]  Morrell Precious BRAVO, MD ED   03/10/2021 0857 03/11/2021 2037 Full Code [id number]  Tilford Bertram HERO, [name] Inpatient   01/13/2021 0936 01/16/2021 1953 Full Code [id number]  Tilford Bertram HERO, [name] Inpatient   08/29/2020 0131 09/05/2020 0019 Full Code [id number]  Lonzell Emeline HERO, DO ED   08/26/2020 0827 08/26/2020 2005 Full Code [id number]  Sim Emery CROME, MD ED   05/13/2020 1120 05/16/2020 2328 Full Code [id number]  Tilford Bertram HERO, [name] ED   05/09/2020 1110 05/09/2020 2145 Full Code [id number]  Tilford Bertram HERO, [name] Inpatient   04/30/2020 0852 04/30/2020 2121 Full Code [id number]  Tilford Bertram HERO, [name] Inpatient   04/29/2020 1049 04/29/2020 2141 Full Code [id number]  Tilford Bertram HERO, [name] Inpatient   04/01/2020 1628 04/05/2020 1920 Full Code [id number]  Tilford Bertram HERO, [name] ED   03/18/2020 1052 03/18/2020 2141 Full  Code [id number]  Morrell Precious BRAVO, MD Inpatient   02/19/2020 1134 02/23/2020 2048 Full Code [id number]  Tilford Bertram HERO, [name] Inpatient   01/29/2020 0926 01/29/2020 2223 Full Code [id number]  Tilford Bertram HERO, [name] Inpatient   01/16/2020 0956 01/16/2020 2206 Full Code [id number]  Tilford Bertram HERO, [name] Inpatient   01/02/2020 1635 01/05/2020 2034 Full Code [id number]  Tilford Bertram HERO, [name] Inpatient   12/12/2019 0900 12/12/2019 2220 Full Code [id number]  Tilford Bertram HERO, [name] Inpatient   12/04/2019 9046 12/04/2019 2108 Full Code [id number]  Morrell Precious BRAVO, MD Inpatient   11/21/2019 1701 11/23/2019 1836 Full Code [id number]  Tilford Bertram HERO, [name] Inpatient   10/10/2019 1224  10/10/2019 2229 Full Code [id number]  Tilford Bertram HERO, [name] Inpatient   09/22/2019 0256 09/25/2019 2259 Full Code [id number]  Sim Emery CROME, MD Inpatient   09/21/2019 1153 09/21/2019 2123 Full Code [id number]  Tilford Bertram HERO, [name] Inpatient   08/04/2019 1645 08/09/2019 1913 Full Code [id number]  Morrell Precious BRAVO, MD Inpatient   08/04/2019 1023 08/04/2019 1645 Full Code [id number]  Morrell Precious BRAVO, MD Inpatient   05/26/2019 0928 05/26/2019 2145 Full Code [id number]  Tilford Bertram HERO, [name] Inpatient   03/14/2019 0901 03/14/2019 2030 Full Code [id number]  Morrell Precious BRAVO, MD Inpatient   02/10/2019 0552 02/15/2019 0308 Full Code [id number]  Charlton Evalene RAMAN, MD Inpatient   01/17/2019 0951 01/17/2019 2000 Full Code [id number]  Tilford Bertram HERO, [name] Inpatient   12/02/2018 1101 12/07/2018 2217 Full Code [id number]  Tilford Bertram HERO, [name] ED   06/28/2018 0047 07/03/2018 2204 Full Code [id number]  Silvester Ales, MD Inpatient   02/02/2017 0734 02/05/2017 1820 Full Code [id number]  Dina Camie BRAVO [facility] ED       Home/SNF/Other Home  Chief Complaint Sickle cell crisis (HCC) [D57.00] 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]  Level of Care/Admitting Diagnosis ED Disposition     ED Disposition  Admit   Condition  --   Comment  Hospital Area: [facility] [[id number]]  Level of Care: Med-Surg [16]  May admit patient to [facility] or [facility] if equivalent level of care is available:: No  Covid Evaluation: Asymptomatic - no recent exposure (last 10 days) testing not required  Diagnosis: Sickle cell anemia with pain [facility]) [708223]  Admitting Physician: MORRELL PRECIOUS BRAVO [[id number]]  Attending Physician: JEGEDE, OLUGBEMIGA E [[id number]]  Bed request comments: 6E  Certification:: I certify this patient will need inpatient services for at least 2 midnights  Expected Medical Readiness: 10/15/2023          Medical History Past Medical History:  Diagnosis Date   Anxiety    HIV  (human immunodeficiency virus infection) (HCC)    Proteinuria    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HCC)    Vitamin D  deficiency 10/2018    Allergies Allergies  Allergen Reactions   Ketorolac  Tromethamine  Swelling and Other (See Comments)    Patient reports facial edema and left arm edema after administration.   Tape Rash and Other (See Comments)    PLEASE DO NOT USE THE CLEAR, THICK, PLASTIC TAPE- only paper tape is tolerated    Wound Dressing Adhesive Rash    IV Location/Drains/Wounds Patient Lines/Drains/Airways Status     Active Line/Drains/Airways     Name Placement date Placement time Site Days   Implanted Port Right Chest --  --  Chest  --            Labs/Imaging Results for orders placed or performed during  the hospital encounter of 10/12/23 (from the past 48 hours)  Comprehensive metabolic panel     Status: Abnormal   Collection Time: 10/12/23  2:04 AM  Result Value Ref Range   Sodium 137 135 - 145 mmol/L   Potassium 3.5 3.5 - 5.1 mmol/L   Chloride 106 98 - 111 mmol/L   CO2 23 22 - 32 mmol/L   Glucose, Bld 96 70 - 99 mg/dL    Comment: Glucose reference range applies only to samples taken after fasting for at least 8 hours.   BUN 12 6 - 20 mg/dL   Creatinine, Ser 9.25 0.61 - 1.24 mg/dL   Calcium  8.2 (L) 8.9 - 10.3 mg/dL   Total Protein 7.6 6.5 - 8.1 g/dL   Albumin 3.4 (L) 3.5 - 5.0 g/dL   AST 34 15 - 41 U/L   ALT 19 0 - 44 U/L   Alkaline Phosphatase 55 38 - 126 U/L   Total Bilirubin 11.5 (H) 0.0 - 1.2 mg/dL   GFR, Estimated >39 >39 mL/min    Comment: (NOTE) Calculated using the CKD-EPI Creatinine Equation (2021)    Anion gap 8 5 - 15    Comment: Performed at [facility], [address] [place], [place] [address]  CBC with Differential     Status: Abnormal   Collection Time: 10/12/23  2:04 AM  Result Value Ref Range   WBC 8.7 4.0 - 10.5 K/uL   RBC 1.17 (L) 4.22 - 5.81 MIL/uL   Hemoglobin 7.5 (L) 13.0 - 17.0 g/dL   HCT 87.4 (L) 60.9 - 47.9  %   MCV 106.8 (H) 80.0 - 100.0 fL   MCH 64.1 (H) 26.0 - 34.0 pg   MCHC 60.0 (H) 30.0 - 36.0 g/dL    Comment: RULED OUT INTERFERING SUBSTANCES   RDW 30.3 (H) 11.5 - 15.5 %   Platelets 331 150 - 400 K/uL   nRBC 33.9 (H) 0.0 - 0.2 %   Neutrophils Relative % 36 %   Neutro Abs 3.2 1.7 - 7.7 K/uL   Lymphocytes Relative 51 %   Lymphs Abs 4.5 (H) 0.7 - 4.0 K/uL   Monocytes Relative 10 %   Monocytes Absolute 0.9 0.1 - 1.0 K/uL   Eosinophils Relative 2 %   Eosinophils Absolute 0.1 0.0 - 0.5 K/uL   Basophils Relative 1 %   Basophils Absolute 0.0 0.0 - 0.1 K/uL   Immature Granulocytes 0 %   Abs Immature Granulocytes 0.02 0.00 - 0.07 K/uL   Pappenheimer Bodies PRESENT    Polychromasia MARKED    Sickle Cells PRESENT    Target Cells PRESENT     Comment: Performed at [facility], [address], [place], [place] [address]  Reticulocytes     Status: Abnormal   Collection Time: 10/12/23  2:04 AM  Result Value Ref Range   Retic Ct Pct 14.8 (H) 0.4 - 3.1 %   RBC. 1.68 (L) 4.22 - 5.81 MIL/uL   Retic Count, Absolute 249.0 (H) 19.0 - 186.0 K/uL    Comment: REPEATED TO VERIFY RESULTS CONFIRMED BY MANUAL DILUTION.    Immature Retic Fract 19.9 (H) 2.3 - 15.9 %    Comment: Performed at [facility], [address], [place], [place] [address]  Troponin I (High Sensitivity)     Status: None   Collection Time: 10/12/23  2:04 AM  Result Value Ref Range   Troponin I (High Sensitivity) 14 <18 ng/L    Comment: (  NOTE) Elevated high sensitivity troponin I (hsTnI) values and significant  changes across serial measurements may suggest ACS but many other  chronic and acute conditions are known to elevate hsTnI results.  Refer to the Links section for chest pain algorithms and additional  guidance. Performed at [facility], [address], [place], [place] [address]   Troponin I (High Sensitivity)     Status: None   Collection Time: 10/12/23   4:02 AM  Result Value Ref Range   Troponin I (High Sensitivity) 14 <18 ng/L    Comment: (NOTE) Elevated high sensitivity troponin I (hsTnI) values and significant  changes across serial measurements may suggest ACS but many other  chronic and acute conditions are known to elevate hsTnI results.  Refer to the Links section for chest pain algorithms and additional  guidance. Performed at [facility], [address] [place], [place] [address]    CT Angio Chest PE W and/or Wo Contrast Result Date: 10/12/2023 CLINICAL DATA:  Sickle cell crisis. Evaluate for pulmonary embolism. No relief of chest pain with usual home meds. EXAM: CT ANGIOGRAPHY CHEST WITH CONTRAST TECHNIQUE: Multidetector CT imaging of the chest was performed using the standard protocol during bolus administration of intravenous contrast. Multiplanar CT image reconstructions and MIPs were obtained to evaluate the vascular anatomy. RADIATION DOSE REDUCTION: This exam was performed according to the departmental dose-optimization program which includes automated exposure control, adjustment of the mA and/or kV according to patient size and/or use of iterative reconstruction technique. CONTRAST:  75mL OMNIPAQUE  IOHEXOL  350 MG/ML SOLN COMPARISON:  CTA chest 08/13/2023, chest CT without contrast 09/25/2022 FINDINGS: Cardiovascular: There is a right chest port with IJ approach catheter terminating about the superior cavoatrial junction. There is cardiomegaly with a right chamber predominance and RV/LV ratio of 1.3. There is contrast reflux into the IVC and hepatic veins which may be seen with right heart dysfunction or tricuspid regurgitation. There is a small pericardial effusion anteriorly to the right. Enlarged pulmonary trunk again measuring 3.7 cm indicating arterial hypertension. Respiratory motion, especially in the bases, largely obscures the subsegmental pulmonary arteries. No arterial embolus is seen at least to the  segmental level. Pulmonary veins are nondistended. The aorta and great vessels are unremarkable. Mediastinum/Nodes: Stable mildly prominent right hilar lymph nodes, largest 1.3 cm in short axis on 5:61. Stable subcarinal lymph nodes up to 1.4 cm. Substernal thymus appears similar. The esophagus is patulous but not thickened. There are borderline prominent bilateral axillary space lymph nodes, also unchanged. The trachea and central airways are clear.  Negative thyroid gland. Lungs/Pleura: Extensive multifocal scarring change and architectural distortion both lungs with underlying chronic ground-glass disease, with the ground-glass opacities most apparent and confluent in the lower lung fields. No new or grossly active infiltrate is seen. There is diffuse bronchial thickening. No new opacity is seen or focal nodules. No pleural effusion. No pneumothorax. Upper Abdomen: Autoinfarcted calcified and shrunken spleen. No acute abnormality. Musculoskeletal: Dense bones consistent with sickle cell disease. Mild thoracic dextroscoliosis. No acute or other significant osseous findings or chest wall abnormality. Review of the MIP images confirms the above findings. IMPRESSION: 1. No evidence of arterial embolus at least to the segmental level. Respiratory motion largely obscures the subsegmental pulmonary arteries. 2. Cardiomegaly with right chamber predominance and contrast reflux into the IVC and hepatic veins, the latter which may be seen with right heart dysfunction or tricuspid regurgitation. 3. Enlarged pulmonary trunk indicating arterial hypertension. 4. Extensive multifocal scarring change  and architectural distortion both lungs with underlying chronic ground-glass disease. No new or grossly active infiltrate is seen. 5. Diffuse bronchial thickening. 6. Stable mildly prominent right hilar, subcarinal and axillary space lymph nodes. 7. Autoinfarcted calcified and shrunken spleen. 8. Dense bones consistent with sickle  cell disease. Electronically Signed   By: Francis Quam M.D.   On: 10/12/2023 04:47    Pending Labs [employer] (From admission, onward)     Start     Ordered   Signed and Held  CBC  (enoxaparin  (LOVENOX )    CrCl >/= 30 with major trauma, spinal cord injury, or selected orthopedic surgery)  Once,   R       Comments: Baseline for enoxaparin  therapy IF NOT already drawn.  Notify MD if PLT < 100 K.    Signed and Held   Signed and Held  Creatinine, serum  (enoxaparin  (LOVENOX )    CrCl >/= 30 with major trauma, spinal cord injury, or selected orthopedic surgery)  Once,   R       Comments: Baseline for enoxaparin  therapy IF NOT already drawn.    Signed and Held   Signed and Held  Creatinine, serum  (enoxaparin  (LOVENOX )    CrCl >/= 30 with major trauma, spinal cord injury, or selected orthopedic surgery)  Weekly,   R     Comments: while on enoxaparin  therapy.    Signed and Held   Signed and Held  CBC  Daily,   R      Signed and Held            Vitals/Pain Today's Vitals   10/12/23 [phone] 10/12/23 0945 10/12/23 1048 10/12/23 1142  BP:      Pulse:  74    Resp:  15    Temp:   97.6 F (36.4 C)   TempSrc:      SpO2:  93%    Weight:      Height:      PainSc: 9    9     Isolation Precautions No active isolations  Medications Medications  senna-docusate (Senokot-S) tablet 1 tablet (1 tablet Oral Not Given 10/12/23 1156)  polyethylene glycol (MIRALAX  / GLYCOLAX ) packet 17 g (has no administration in time range)  ondansetron  (ZOFRAN ) tablet 4 mg (has no administration in time range)    Or  ondansetron  (ZOFRAN ) injection 4 mg (has no administration in time range)  naloxone  (NARCAN ) injection 0.4 mg (has no administration in time range)    And  sodium chloride  flush (NS) 0.9 % injection 9 mL (has no administration in time range)  diphenhydrAMINE  (BENADRYL ) capsule 25 mg (has no administration in time range)  0.45 % sodium chloride  infusion ( Intravenous New Bag/Given 10/12/23  0702)  sodium chloride  flush (NS) 0.9 % injection 9 mL (has no administration in time range)  HYDROmorphone  (DILAUDID ) 1 mg/mL PCA injection (has no administration in time range)  oxyCODONE -acetaminophen  (PERCOCET/ROXICET) 5-325 MG per tablet 2 tablet (2 tablets Oral Given 10/12/23 0215)  diphenhydrAMINE  (BENADRYL ) injection 25 mg (25 mg Intravenous Given 10/12/23 0215)  acetaminophen  (TYLENOL ) tablet 325 mg (325 mg Oral Given 10/12/23 0216)  HYDROmorphone  (DILAUDID ) injection 2 mg (2 mg Intravenous Given 10/12/23 0215)  sodium chloride  0.9 % bolus 1,000 mL (0 mLs Intravenous Stopped 10/12/23 0354)  HYDROmorphone  (DILAUDID ) injection 2 mg (2 mg Intravenous Given 10/12/23 0353)  iohexol  (OMNIPAQUE ) 350 MG/ML injection 75 mL (75 mLs Intravenous Contrast Given 10/12/23 0340)  HYDROmorphone  (DILAUDID ) injection 2 mg (2 mg Intravenous  Given 10/12/23 0553)  diphenhydrAMINE  (BENADRYL ) injection 25 mg (25 mg Intravenous Given 10/12/23 0552)  HYDROmorphone  (DILAUDID ) injection 2 mg (2 mg Intravenous Given 10/12/23 1142)    Mobility walks

## 2023-10-12 NOTE — ED Provider Notes (Signed)
 [place] EMERGENCY DEPARTMENT AT [facility] Provider Note   CSN: [id number] Arrival date & time: 10/12/23  0050     Patient presents with: Sickle Cell Pain Crisis   Martin [name] is a 33 y.o. male With history of HIV, sickle cell disease, anxiety, PE currently on Eliquis .  Patient presents to the ED for evaluation of sickle cell crisis, chest pain or shortness of breath.  Reports that he began having chest pain, shortness of breath, pain in bilateral hips and low back today.  States that he feels as if he is having sickle cell crisis.  He reports that he has tried taking his prescribed oxycodone  10 mg at home but with no relief.  Denies fevers at home, nausea or vomiting.  Denies leg swelling.  Denies history of chest pain or shortness of breath with his sickle cell crises.  He reports that this is what concerned him today brought him in.  Reports history of PE and is compliant on his Eliquis  he reports.  Also has history of acute chest syndrome.    Sickle Cell Pain Crisis Associated symptoms: chest pain and shortness of breath        Prior to Admission medications   Medication Sig Start Date End Date Taking? Authorizing Provider  abacavir -dolutegravir -lamiVUDine  (TRIUMEQ ) 600-50-300 MG tablet Take 1 tablet by mouth daily. 08/13/23  Yes Pearlean Manus, MD  albuterol  (VENTOLIN  HFA) 108 (90 Base) MCG/ACT inhaler Inhale 2 puffs into the lungs every 6 (six) hours as needed for wheezing or shortness of breath. 08/13/23  Yes Emokpae, Courage, MD  apixaban  (ELIQUIS ) 5 MG TABS tablet Take 1 tablet (5 mg total) by mouth 2 (two) times daily. 08/13/23  Yes Emokpae, Courage, MD  cyclobenzaprine  (FLEXERIL ) 10 MG tablet Take 10 mg by mouth 2 (two) times daily as needed for muscle spasms. 12/22/22  Yes [provider]  diphenhydrAMINE  (BENADRYL ) 25 MG tablet Take 1 tablet (25 mg total) by mouth every 4 (four) hours as needed for itching. 08/13/23  Yes Pearlean Manus, MD  folic  acid (FOLVITE ) 400 MCG tablet Take 1 tablet (400 mcg total) by mouth daily. 08/13/23  Yes Emokpae, Courage, MD  gabapentin  (NEURONTIN ) 300 MG capsule Take 1 capsule (300 mg total) by mouth at bedtime as needed (nerve pain). 08/13/23  Yes Pearlean Manus, MD  guaiFENesin  (MUCINEX ) 600 MG 12 hr tablet Take 1 tablet (600 mg total) by mouth 2 (two) times daily. Patient taking differently: Take 600 mg by mouth 2 (two) times daily as needed for cough. 08/13/23 08/12/24 Yes Emokpae, Courage, MD  hydroxyurea  (HYDREA ) 500 MG capsule Take 4 capsules (2,000 mg total) by mouth at bedtime. 08/13/23  Yes Pearlean Manus, MD  NARCAN  4 MG/0.1ML LIQD nasal spray kit Place 1 spray into the nose once as needed (as directed). 01/07/23  Yes [provider]  oxyCODONE -acetaminophen  (PERCOCET) 10-325 MG tablet Take 1 tablet by mouth every 4 (four) hours as needed for pain. 09/27/23  Yes [name], [name], [name]  triamcinolone  cream (KENALOG ) 0.1 % Apply 1 Application topically 2 (two) times daily. Patient taking differently: Apply 1 Application topically 2 (two) times daily as needed (for itching). 01/12/23  Yes Tilford Bertram HERO, [name]  Deferiprone  (FERRIPROX ) 500 MG TABS Take 1 tablet by mouth daily. Patient not taking: Reported on 10/12/2023 08/13/23   Pearlean Manus, MD  pantoprazole  (PROTONIX ) 40 MG tablet Take 1 tablet (40 mg total) by mouth daily as needed (acid reflux). Patient not taking: Reported on 10/12/2023  08/13/23   Pearlean Manus, MD  senna-docusate (SENOKOT-S) 8.6-50 MG tablet Take 2 tablets by mouth at bedtime. Patient not taking: Reported on 10/12/2023 08/13/23   Pearlean Manus, MD    Allergies: Ketorolac  tromethamine , Tape, and Wound dressing adhesive    Review of Systems  Respiratory:  Positive for shortness of breath.   Cardiovascular:  Positive for chest pain.  Musculoskeletal:  Positive for arthralgias and back pain.  All other systems reviewed and are negative.   Updated Vital Signs BP  109/86   Pulse 80   Temp 98.1 F (36.7 C)   Resp 13   Ht 6' 3 (1.905 m)   Wt 63.5 kg   SpO2 94%   BMI 17.50 kg/m   Physical Exam Vitals and nursing note reviewed.  Constitutional:      General: He is not in acute distress.    Appearance: He is well-developed.  HENT:     Head: Normocephalic and atraumatic.   Eyes:     Conjunctiva/sclera: Conjunctivae normal.    Cardiovascular:     Rate and Rhythm: Normal rate and regular rhythm.     Heart sounds: No murmur heard. Pulmonary:     Effort: Pulmonary effort is normal. No respiratory distress.     Breath sounds: Normal breath sounds.  Abdominal:     Palpations: Abdomen is soft.     Tenderness: There is no abdominal tenderness.   Musculoskeletal:        General: No swelling.     Cervical back: Neck supple.   Skin:    General: Skin is warm and dry.     Capillary Refill: Capillary refill takes less than 2 seconds.   Neurological:     Mental Status: He is alert and oriented to person, place, and time. Mental status is at baseline.     Comments: 5 out of 5 strength bilateral lower extremities  Psychiatric:        Mood and Affect: Mood normal.     (all labs ordered are listed, but only abnormal results are displayed) Labs Reviewed  COMPREHENSIVE METABOLIC PANEL WITH GFR - Abnormal; Notable for the following components:      Result Value   Calcium  8.2 (*)    Albumin 3.4 (*)    Total Bilirubin 11.5 (*)    All other components within normal limits  CBC WITH DIFFERENTIAL/PLATELET - Abnormal; Notable for the following components:   RBC 1.17 (*)    Hemoglobin 7.5 (*)    HCT 12.5 (*)    MCV 106.8 (*)    MCH 64.1 (*)    MCHC 60.0 (*)    RDW 30.3 (*)    nRBC 33.9 (*)    Lymphs Abs 4.5 (*)    All other components within normal limits  RETICULOCYTES - Abnormal; Notable for the following components:   Retic Ct Pct 14.8 (*)    RBC. 1.68 (*)    Retic Count, Absolute 249.0 (*)    Immature Retic Fract 19.9 (*)    All  other components within normal limits  TROPONIN I (HIGH SENSITIVITY)  TROPONIN I (HIGH SENSITIVITY)    EKG: None  Radiology: CT Angio Chest PE W and/or Wo Contrast Result Date: 10/12/2023 CLINICAL DATA:  Sickle cell crisis. Evaluate for pulmonary embolism. No relief of chest pain with usual home meds. EXAM: CT ANGIOGRAPHY CHEST WITH CONTRAST TECHNIQUE: Multidetector CT imaging of the chest was performed using the standard protocol during bolus administration of intravenous contrast. Multiplanar CT image  reconstructions and MIPs were obtained to evaluate the vascular anatomy. RADIATION DOSE REDUCTION: This exam was performed according to the departmental dose-optimization program which includes automated exposure control, adjustment of the mA and/or kV according to patient size and/or use of iterative reconstruction technique. CONTRAST:  75mL OMNIPAQUE  IOHEXOL  350 MG/ML SOLN COMPARISON:  CTA chest 08/13/2023, chest CT without contrast 09/25/2022 FINDINGS: Cardiovascular: There is a right chest port with IJ approach catheter terminating about the superior cavoatrial junction. There is cardiomegaly with a right chamber predominance and RV/LV ratio of 1.3. There is contrast reflux into the IVC and hepatic veins which may be seen with right heart dysfunction or tricuspid regurgitation. There is a small pericardial effusion anteriorly to the right. Enlarged pulmonary trunk again measuring 3.7 cm indicating arterial hypertension. Respiratory motion, especially in the bases, largely obscures the subsegmental pulmonary arteries. No arterial embolus is seen at least to the segmental level. Pulmonary veins are nondistended. The aorta and great vessels are unremarkable. Mediastinum/Nodes: Stable mildly prominent right hilar lymph nodes, largest 1.3 cm in short axis on 5:61. Stable subcarinal lymph nodes up to 1.4 cm. Substernal thymus appears similar. The esophagus is patulous but not thickened. There are borderline  prominent bilateral axillary space lymph nodes, also unchanged. The trachea and central airways are clear.  Negative thyroid gland. Lungs/Pleura: Extensive multifocal scarring change and architectural distortion both lungs with underlying chronic ground-glass disease, with the ground-glass opacities most apparent and confluent in the lower lung fields. No new or grossly active infiltrate is seen. There is diffuse bronchial thickening. No new opacity is seen or focal nodules. No pleural effusion. No pneumothorax. Upper Abdomen: Autoinfarcted calcified and shrunken spleen. No acute abnormality. Musculoskeletal: Dense bones consistent with sickle cell disease. Mild thoracic dextroscoliosis. No acute or other significant osseous findings or chest wall abnormality. Review of the MIP images confirms the above findings. IMPRESSION: 1. No evidence of arterial embolus at least to the segmental level. Respiratory motion largely obscures the subsegmental pulmonary arteries. 2. Cardiomegaly with right chamber predominance and contrast reflux into the IVC and hepatic veins, the latter which may be seen with right heart dysfunction or tricuspid regurgitation. 3. Enlarged pulmonary trunk indicating arterial hypertension. 4. Extensive multifocal scarring change and architectural distortion both lungs with underlying chronic ground-glass disease. No new or grossly active infiltrate is seen. 5. Diffuse bronchial thickening. 6. Stable mildly prominent right hilar, subcarinal and axillary space lymph nodes. 7. Autoinfarcted calcified and shrunken spleen. 8. Dense bones consistent with sickle cell disease. Electronically Signed   By: Francis Quam M.D.   On: 10/12/2023 04:47     Procedures   Medications Ordered in the ED  senna-docusate (Senokot-S) tablet 1 tablet (has no administration in time range)  polyethylene glycol (MIRALAX  / GLYCOLAX ) packet 17 g (has no administration in time range)  ondansetron  (ZOFRAN ) tablet 4 mg  (has no administration in time range)    Or  ondansetron  (ZOFRAN ) injection 4 mg (has no administration in time range)  oxyCODONE -acetaminophen  (PERCOCET/ROXICET) 5-325 MG per tablet 2 tablet (2 tablets Oral Given 10/12/23 0215)  diphenhydrAMINE  (BENADRYL ) injection 25 mg (25 mg Intravenous Given 10/12/23 0215)  acetaminophen  (TYLENOL ) tablet 325 mg (325 mg Oral Given 10/12/23 0216)  HYDROmorphone  (DILAUDID ) injection 2 mg (2 mg Intravenous Given 10/12/23 0215)  sodium chloride  0.9 % bolus 1,000 mL (0 mLs Intravenous Stopped 10/12/23 0354)  HYDROmorphone  (DILAUDID ) injection 2 mg (2 mg Intravenous Given 10/12/23 0353)  iohexol  (OMNIPAQUE ) 350 MG/ML injection 75 mL (75 mLs Intravenous  Contrast Given 10/12/23 0340)  HYDROmorphone  (DILAUDID ) injection 2 mg (2 mg Intravenous Given 10/12/23 0553)  diphenhydrAMINE  (BENADRYL ) injection 25 mg (25 mg Intravenous Given 10/12/23 0552)    Medical Decision Making Amount and/or Complexity of Data Reviewed Labs: ordered. Radiology: ordered.  Risk OTC drugs. Prescription drug management. Decision regarding hospitalization.   33 year old male presents for evaluation.  Please see HPI for further details.  On examination the patient is afebrile and nontachycardic.  His lung sounds are clear bilaterally, he has no hypoxia.  Abdomen soft and compressible.  Neurological examination at baseline with 5 out of 5 strength bilateral lower extremities.  Patient reports chest pain and shortness of breath which is not consistent with his past sickle cell crises.  He has history of acute chest as well as PEs in the chart.  He reports compliance on his blood thinning medication.  Will assess with CBC, CMP, reticulocytes and troponin.  Will add on CTA to assess for PE.  Patient given Benadryl , Tylenol , home dose oxycodone , 2 mg Dilaudid  for pain control.  Patient CBC without leukocytosis, hemoglobin 7.5 which appears to be baseline.  CMP with no electrolyte derangement,  elevated bilirubin 11.5.  7 days ago bilirubinate.  Patient troponin 14, delta 14.  Patient troponins are flat.  EKG nonischemic.  Patient requesting additional pain medication at this time, 2 mg Dilaudid  ordered.  CTA has resulted and shows no acute findings.  No evidence of acute PE.  Patient requesting additional pain medication at this time.  2 mg Dilaudid  administered as well as 25 mg Benadryl .  Patient is requesting admission.  Have discussed with Dr. Debby of the hospitalist service.  She has agreed to admit the patient.  Patient amenable to plan.  Stable on admission.   Final diagnoses:  Sickle cell pain crisis [facility])    ED Discharge Orders     None          Ruthell Lonni JULIANNA DEVONNA 10/12/23 [id number]    Trine Raynell Moder, MD 10/12/23 [phone]

## 2023-10-12 NOTE — ED Notes (Signed)
 Delivered Ham Sandwich and soda per request

## 2023-10-12 NOTE — ED Triage Notes (Addendum)
 Patient c/o sickle cell crisis x 1 day. Patient report taking PRN pain meds without relief. Patient report nausea, denies vomiting. Patient denies Chest pain or SOB.

## 2023-10-12 NOTE — ED Notes (Signed)
 Ham sandwich and cola given per request.

## 2023-10-13 LAB — CBC
HCT: 20.7 % — ABNORMAL LOW (ref 39.0–52.0)
Hemoglobin: 7.4 g/dL — ABNORMAL LOW (ref 13.0–17.0)
MCH: 38.9 pg — ABNORMAL HIGH (ref 26.0–34.0)
MCHC: 35.7 g/dL (ref 30.0–36.0)
MCV: 108.9 fL — ABNORMAL HIGH (ref 80.0–100.0)
Platelets: 341 10*3/uL (ref 150–400)
RBC: 1.9 MIL/uL — ABNORMAL LOW (ref 4.22–5.81)
RDW: 25.2 % — ABNORMAL HIGH (ref 11.5–15.5)
WBC: 9.3 10*3/uL (ref 4.0–10.5)
nRBC: 24.5 % — ABNORMAL HIGH (ref 0.0–0.2)

## 2023-10-13 NOTE — Progress Notes (Signed)
 Patient ID: Martin [name], male   DOB: 02-15-1991, 33 y.o.   MRN: [id number] Subjective: Martin [name]  is a 33 y.o. male with history of sickle cell disease,  chronic pain syndrome, opiate dependency and tolerance, history of HIV, and history of PE on Xarelto.  Patient presents to the emergency department with generalize body pain, chest pain and lower extremity pain. He is on 3l oxygen  at home.   Patient continues to report pain of 8/10.  No new concerns Denies cough, fever, diarrhea /vomiting/diarrhea. He has had no sick contacts and no recent travels    Objective:  Vital signs in last 24 hours:  Vitals:   10/13/23 0500 10/13/23 0641 10/13/23 0758 10/13/23 0933  BP:  112/76  118/81  Pulse:  97  93  Resp: 18 18 16 18   Temp:  98.2 F (36.8 C)  98.3 F (36.8 C)  TempSrc:  Oral  Oral  SpO2: 90% 91% 92% 90%  Weight:      Height:        Intake/Output from previous day:   Intake/Output Summary (Last 24 hours) at 10/13/2023 1143 Last data filed at 10/13/2023 0313 Gross per 24 hour  Intake 2013.26 ml  Output 1600 ml  Net 413.26 ml    Physical Exam: General: Alert, awake, oriented x3, in no acute distress.  HEENT: [place]/AT PEERL, EOMI Neck: Trachea midline,  no masses, no thyromegal,y no JVD, no carotid bruit OROPHARYNX:  Moist, No exudate/ erythema/lesions.  Heart: Regular rate and rhythm, without murmurs, rubs, gallops, PMI non-displaced, no heaves or thrills on palpation.  Lungs: Clear to auscultation, no wheezing or rhonchi noted. No increased vocal fremitus resonant to percussion  Abdomen: Soft, nontender, nondistended, positive bowel sounds, no masses no hepatosplenomegaly noted..  Neuro: No focal neurological deficits noted cranial nerves II through XII grossly intact. DTRs 2+ bilaterally upper and lower extremities. Strength 5 out of 5 in bilateral upper and lower extremities. Musculoskeletal: Generalized body tenderness.  Psychiatric: Patient alert and oriented x3,  good insight and cognition, good recent to remote recall. Lymph node survey: No cervical axillary or inguinal lymphadenopathy noted.  Lab Results:  Basic Metabolic Panel:    Component Value Date/Time   NA 137 10/12/2023 0204   NA 140 01/12/2023 1607   K 3.5 10/12/2023 0204   CL 106 10/12/2023 0204   CO2 23 10/12/2023 0204   BUN 12 10/12/2023 0204   BUN 8 01/12/2023 1607   CREATININE 0.74 10/12/2023 0204   GLUCOSE 96 10/12/2023 0204   CALCIUM  8.2 (L) 10/12/2023 0204   CBC:    Component Value Date/Time   WBC 9.3 10/13/2023 0449   HGB 7.4 (L) 10/13/2023 0449   HGB 7.4 (L) 01/12/2023 1607   HCT 20.7 (L) 10/13/2023 0449   HCT 21.7 (L) 01/12/2023 1607   PLT 341 10/13/2023 0449   PLT 321 01/12/2023 1607   MCV 108.9 (H) 10/13/2023 0449   MCV 94 01/12/2023 1607   NEUTROABS 3.2 10/12/2023 0204   NEUTROABS 3.8 01/12/2023 1607   LYMPHSABS 4.5 (H) 10/12/2023 0204   LYMPHSABS 5.6 (H) 01/12/2023 1607   MONOABS 0.9 10/12/2023 0204   EOSABS 0.1 10/12/2023 0204   EOSABS 0.2 01/12/2023 1607   BASOSABS 0.0 10/12/2023 0204   BASOSABS 0.1 01/12/2023 1607    No results found for this or any previous visit (from the past 240 hours).  Studies/Results: CT Angio Chest PE W and/or Wo Contrast Result Date: 10/12/2023 CLINICAL DATA:  Sickle cell crisis. Evaluate  for pulmonary embolism. No relief of chest pain with usual home meds. EXAM: CT ANGIOGRAPHY CHEST WITH CONTRAST TECHNIQUE: Multidetector CT imaging of the chest was performed using the standard protocol during bolus administration of intravenous contrast. Multiplanar CT image reconstructions and MIPs were obtained to evaluate the vascular anatomy. RADIATION DOSE REDUCTION: This exam was performed according to the departmental dose-optimization program which includes automated exposure control, adjustment of the mA and/or kV according to patient size and/or use of iterative reconstruction technique. CONTRAST:  75mL OMNIPAQUE  IOHEXOL  350 MG/ML  SOLN COMPARISON:  CTA chest 08/13/2023, chest CT without contrast 09/25/2022 FINDINGS: Cardiovascular: There is a right chest port with IJ approach catheter terminating about the superior cavoatrial junction. There is cardiomegaly with a right chamber predominance and RV/LV ratio of 1.3. There is contrast reflux into the IVC and hepatic veins which may be seen with right heart dysfunction or tricuspid regurgitation. There is a small pericardial effusion anteriorly to the right. Enlarged pulmonary trunk again measuring 3.7 cm indicating arterial hypertension. Respiratory motion, especially in the bases, largely obscures the subsegmental pulmonary arteries. No arterial embolus is seen at least to the segmental level. Pulmonary veins are nondistended. The aorta and great vessels are unremarkable. Mediastinum/Nodes: Stable mildly prominent right hilar lymph nodes, largest 1.3 cm in short axis on 5:61. Stable subcarinal lymph nodes up to 1.4 cm. Substernal thymus appears similar. The esophagus is patulous but not thickened. There are borderline prominent bilateral axillary space lymph nodes, also unchanged. The trachea and central airways are clear.  Negative thyroid gland. Lungs/Pleura: Extensive multifocal scarring change and architectural distortion both lungs with underlying chronic ground-glass disease, with the ground-glass opacities most apparent and confluent in the lower lung fields. No new or grossly active infiltrate is seen. There is diffuse bronchial thickening. No new opacity is seen or focal nodules. No pleural effusion. No pneumothorax. Upper Abdomen: Autoinfarcted calcified and shrunken spleen. No acute abnormality. Musculoskeletal: Dense bones consistent with sickle cell disease. Mild thoracic dextroscoliosis. No acute or other significant osseous findings or chest wall abnormality. Review of the MIP images confirms the above findings. IMPRESSION: 1. No evidence of arterial embolus at least to the  segmental level. Respiratory motion largely obscures the subsegmental pulmonary arteries. 2. Cardiomegaly with right chamber predominance and contrast reflux into the IVC and hepatic veins, the latter which may be seen with right heart dysfunction or tricuspid regurgitation. 3. Enlarged pulmonary trunk indicating arterial hypertension. 4. Extensive multifocal scarring change and architectural distortion both lungs with underlying chronic ground-glass disease. No new or grossly active infiltrate is seen. 5. Diffuse bronchial thickening. 6. Stable mildly prominent right hilar, subcarinal and axillary space lymph nodes. 7. Autoinfarcted calcified and shrunken spleen. 8. Dense bones consistent with sickle cell disease. Electronically Signed   By: Francis Quam M.D.   On: 10/12/2023 04:47    Medications: Scheduled Meds:  abacavir -dolutegravir -lamiVUDine   1 tablet Oral Daily   apixaban   5 mg Oral BID   folic acid   0.5 mg Oral Daily   HYDROmorphone    Intravenous Q4H   hydroxyurea   2,000 mg Oral QHS   senna-docusate  2 tablet Oral QHS   Continuous Infusions:  sodium chloride  125 mL/hr at 10/13/23 0313   PRN Meds:.albuterol , benzocaine , cyclobenzaprine , diphenhydrAMINE , gabapentin , naloxone , oxyCODONE -acetaminophen  **AND** oxyCODONE , pantoprazole , polyethylene glycol, sodium chloride  flush  Consultants: None  Procedures: None  Antibiotics: None  Assessment/Plan: Active Problems:   Chronic pain syndrome   Anxiety   Leukocytosis   Sickle cell anemia with pain (HCC)  Sickle cell crisis (HCC)   Hb Sickle Cell Disease with Pain crisis: Continue IVF 0.45% Saline @ 125 mls/hour, continue weight based Dilaudid  PCA, continue oral home pain medications as ordered. Monitor vitals very closely, Re-evaluate pain scale regularly, 2 L of Oxygen  by [place]. Patient encouraged to ambulate on the hallway today.  Leukocytosis: Stable Anemia of Chronic Disease: CBC 7.5 g/dL slightly lower than patient's  baseline.  No need for transfusion at this time we will continue to monitor CBC. Chronic pain Syndrome: Continue oral home pain medication. Anxiety: Stable, continue medication as prescribed.  Code Status: Full Code Family Communication: N/A Disposition Plan: Not yet ready for discharge  [name] Cover NP If 7PM-7AM, please contact night-coverage.  10/13/2023, 11:43 AM  LOS: 1 day

## 2023-10-13 NOTE — Plan of Care (Signed)
  Problem: Clinical Measurements: Goal: Will remain free from infection Outcome: Progressing   Problem: Activity: Goal: Risk for activity intolerance will decrease Outcome: Progressing   Problem: Elimination: Goal: Will not experience complications related to bowel motility Outcome: Progressing Goal: Will not experience complications related to urinary retention Outcome: Progressing   Problem: Pain Managment: Goal: General experience of comfort will improve and/or be controlled Outcome: Progressing   Problem: Safety: Goal: Ability to remain free from injury will improve Outcome: Progressing

## 2023-10-13 NOTE — TOC Initial Note (Signed)
 Transition of Care [facility]) - Initial/Assessment Note    Patient Details  Name: Martin [name] MRN: [id number] Date of Birth: 07/30/1990  Transition of Care [facility]) CM/SW Contact:    Toy LITTIE Agar, RN Phone [phone]  10/13/2023, 1:15 PM  Clinical Narrative:                 TOC following patient admitted from home with high risk for readmission. Patient is independent at baseline. DME- has walker that he received on last admission. Patient has insurance ands access to affordable medications. Patient does have PCP ( [name], [name], [name] ) and follows up on a regular basis. Currently there are no TOC needs.     Barriers to Discharge: Continued Medical Work up   Patient Goals and CMS Choice Patient states their goals for this hospitalization and ongoing recovery are:: Wants pain to get better   Choice offered to / list presented to : NA      Expected Discharge Plan and Services In-house Referral: NA Discharge Planning Services: NA Post Acute Care Choice: NA Living arrangements for the past 2 months: Apartment                 DME Arranged: N/A         HH Arranged: NA HH Agency: NA        Prior Living Arrangements/Services Living arrangements for the past 2 months: Apartment Lives with:: Self Patient language and need for interpreter reviewed:: Yes Do you feel safe going back to the place where you live?: Yes      Need for Family Participation in Patient Care: No (Comment) Care giver support system in place?: Yes (comment) Current home services: DME (walker) Criminal Activity/Legal Involvement Pertinent to Current Situation/Hospitalization: No - Comment as needed  Activities of Daily Living   ADL Screening (condition at time of admission) Independently performs ADLs?: Yes (appropriate for developmental age) Is the patient deaf or have difficulty hearing?: No Does the patient have difficulty seeing, even when wearing glasses/contacts?: No Does the  patient have difficulty concentrating, remembering, or making decisions?: No  Permission Sought/Granted Permission sought to share information with : Family Supports Permission granted to share information with : No              Emotional Assessment Appearance:: Appears stated age Attitude/Demeanor/Rapport: Gracious Affect (typically observed): Pleasant, Quiet Orientation: : Oriented to Self, Oriented to Place, Oriented to  Time, Oriented to Situation Alcohol  / Substance Use: Not Applicable Psych Involvement: No (comment)  Admission diagnosis:  Sickle cell crisis (HCC) [D57.00] 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] Sickle cell pain crisis (HCC) [D57.00] Patient Active Problem List   Diagnosis Date Noted   Sickle cell crisis (HCC) 10/12/2023   Chronic respiratory failure (HCC) 08/04/2023   Iron overload, 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   Influenza A with pneumonia 06/20/2023   Acute hypoxemic respiratory failure (HCC) 06/20/2023   AKI (acute kidney injury) (HCC) 06/20/2023   Influenza A with respiratory manifestations 06/20/2023   Acute on chronic anemia 03/16/2023   History of HIV infection (HCC) 03/16/2023   Hypokalemia 03/16/2023   Anemia of chronic disease 07/13/2022   Hyperbilirubinemia 07/13/2022   Tinea capitis 07/29/2021   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HCC) 06/24/2021   Bacteremia due to Enterococcus 08/29/2020   Acute chest syndrome (HCC) 08/26/2020   Hypoxia 07/09/2020   COVID-19 05/13/2020   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/18/2020   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   Abnormal penile discharge, without blood  02/22/2020   Leukocytosis 01/02/2020   Anxiety 11/27/2019   Proteinuria 11/27/2019   Acute on chronic respiratory failure with hypoxia (HCC) 11/16/2019   Chronic, continuous use of opioids 08/15/2019   Seasonal allergies 08/15/2019   Chest congestion    Sickle cell anemia with crisis (HCC) 08/04/2019   Chronic pain syndrome 08/04/2019   History of pulmonary  embolus (PE) 08/04/2019   Single subsegmental pulmonary embolism without acute cor pulmonale (HCC) 02/10/2019   Acute chest syndrome due to sickle cell crisis (HCC) 02/10/2019   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) 03/09/2018   Bone pain 03/07/2018   Hip pain 03/07/2018   Acute bronchitis due to Streptococcus 02/21/2018   Heart murmur 02/03/2017   Sickle cell disease with crisis (HCC) 02/02/2017   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 [place] [address] Phone: [phone] Fax: [phone]  [id number] - [place], [place] - [address] AT [facility] [address] [place] [address] Phone: [phone] Fax: [phone]     Social Drivers of Health (SDOH) Social History: SDOH Screenings   Food Insecurity: No Food Insecurity (10/12/2023)  Recent Concern: Food Insecurity - Food Insecurity Present (08/03/2023)  Housing: Low Risk  (10/12/2023)  Transportation Needs: Unmet Transportation Needs (10/12/2023)  Utilities: At Risk (10/12/2023)  Depression (PHQ2-9): Medium Risk (08/04/2023)  Financial Resource Strain: Low Risk  (10/09/2023)   Received from [facility]  Recent Concern: Financial Resource Strain - Medium Risk (08/03/2023)  Physical Activity: Sufficiently Active (10/09/2023)   Received from [facility]  Social Connections: Socially Isolated (10/09/2023)   Received from [facility]  Stress: No Stress Concern Present (10/09/2023)    Received from [facility]  Tobacco Use: Low Risk  (10/12/2023)   SDOH Interventions:     Readmission Risk Interventions    10/13/2023    1:11 PM 08/12/2023    4:01 PM 03/16/2023    9:37 AM  Readmission Risk Prevention Plan  Transportation Screening Complete Complete Complete  Medication Review Oceanographer) Complete Complete Complete  PCP or Specialist appointment within 3-5 days of discharge Complete Complete Complete  HRI or Home Care Consult  Complete Complete  SW Recovery Care/Counseling Consult Complete Complete Complete  Palliative Care Screening Not Applicable Not Applicable Not Applicable  Skilled Nursing Facility Not Applicable Not Applicable Not Applicable

## 2023-10-14 LAB — CBC
HCT: 21.3 % — ABNORMAL LOW (ref 39.0–52.0)
Hemoglobin: 7.6 g/dL — ABNORMAL LOW (ref 13.0–17.0)
MCH: 38.6 pg — ABNORMAL HIGH (ref 26.0–34.0)
MCHC: 35.7 g/dL (ref 30.0–36.0)
MCV: 108.1 fL — ABNORMAL HIGH (ref 80.0–100.0)
Platelets: 342 10*3/uL (ref 150–400)
RBC: 1.97 MIL/uL — ABNORMAL LOW (ref 4.22–5.81)
RDW: 25.2 % — ABNORMAL HIGH (ref 11.5–15.5)
WBC: 8 10*3/uL (ref 4.0–10.5)
nRBC: 23.7 % — ABNORMAL HIGH (ref 0.0–0.2)

## 2023-10-14 MED ORDER — CHLORHEXIDINE GLUCONATE CLOTH 2 % EX PADS
6.0000 | MEDICATED_PAD | Freq: Every day | CUTANEOUS | Status: DC
  Administered 2023-10-14 – 2023-10-17 (×4): 6 via TOPICAL

## 2023-10-14 MED ORDER — BISACODYL 10 MG RE SUPP
10.0000 mg | Freq: Once | RECTAL | Status: AC
  Administered 2023-10-15: 10 mg via RECTAL
  Filled 2023-10-14: qty 1

## 2023-10-14 MED ORDER — SODIUM CHLORIDE 0.9% FLUSH
10.0000 mL | Freq: Two times a day (BID) | INTRAVENOUS | Status: DC
  Administered 2023-10-14 – 2023-10-18 (×9): 10 mL

## 2023-10-14 MED ORDER — SODIUM CHLORIDE 0.9% FLUSH: 10.0000 mL | INTRAVENOUS | Status: DC | PRN

## 2023-10-14 NOTE — Plan of Care (Signed)

## 2023-10-14 NOTE — Plan of Care (Signed)
  Problem: Sensory: Goal: Pain level will decrease with appropriate interventions Outcome: Progressing   Problem: Clinical Measurements: Goal: Will remain free from infection Outcome: Progressing Goal: Diagnostic test results will improve Outcome: Progressing   Problem: Activity: Goal: Risk for activity intolerance will decrease Outcome: Progressing   Problem: Elimination: Goal: Will not experience complications related to bowel motility Outcome: Progressing Goal: Will not experience complications related to urinary retention Outcome: Progressing   Problem: Pain Managment: Goal: General experience of comfort will improve and/or be controlled Outcome: Progressing   Problem: Safety: Goal: Ability to remain free from injury will improve Outcome: Progressing

## 2023-10-14 NOTE — Progress Notes (Addendum)
 Patient ID: Martin [name], male   DOB: 1990/08/10, 33 y.o.   MRN: [id number] Subjective: Martin [name]  is a 33 y.o. male with history of sickle cell disease,  chronic pain syndrome, opiate dependency and tolerance, history of HIV, and history of PE on Xarelto.  Patient presents to the emergency department with generalize body pain, chest pain and lower extremity pain. He is on 3l oxygen  at home.   Patient continues to report pain of 8/10.  No new concerns Denies cough, fever, diarrhea /vomiting/diarrhea.  Encouraged to ambulate.   Objective:  Vital signs in last 24 hours:  Vitals:   10/15/23 0840 10/15/23 1121 10/15/23 1152 10/15/23 1400  BP:    111/75  Pulse:    79  Resp:   12 20  Temp:    98.3 F (36.8 C)  TempSrc:    Oral  SpO2: (S) 92% 95%  94%  Weight:      Height:        Intake/Output from previous day:  No intake or output data in the 24 hours ending 10/15/23 1454   Physical Exam: General: Alert, awake, oriented x3, in no acute distress.  HEENT: [place]/AT PEERL, EOMI Neck: Trachea midline,  no masses, no thyromegal,y no JVD, no carotid bruit OROPHARYNX:  Moist, No exudate/ erythema/lesions.  Heart: Regular rate and rhythm, without murmurs, rubs, gallops, PMI non-displaced, no heaves or thrills on palpation.  Lungs: Clear to auscultation, no wheezing or rhonchi noted. No increased vocal fremitus resonant to percussion  Abdomen: Soft, nontender, nondistended, positive bowel sounds, no masses no hepatosplenomegaly noted..  Neuro: No focal neurological deficits noted cranial nerves II through XII grossly intact. DTRs 2+ bilaterally upper and lower extremities. Strength 5 out of 5 in bilateral upper and lower extremities. Musculoskeletal: Generalized body tenderness.  Psychiatric: Patient alert and oriented x3, good insight and cognition, good recent to remote recall. Lymph node survey: No cervical axillary or inguinal lymphadenopathy noted.  Lab Results:  Basic  Metabolic Panel:    Component Value Date/Time   NA 137 10/15/2023 0548   NA 140 01/12/2023 1607   K 4.0 10/15/2023 0548   CL 102 10/15/2023 0548   CO2 26 10/15/2023 0548   BUN 12 10/15/2023 0548   BUN 8 01/12/2023 1607   CREATININE 1.04 10/15/2023 0548   GLUCOSE 108 (H) 10/15/2023 0548   CALCIUM  8.8 (L) 10/15/2023 0548   CBC:    Component Value Date/Time   WBC 9.8 10/15/2023 0548   HGB 7.8 (L) 10/15/2023 0548   HGB 7.4 (L) 01/12/2023 1607   HCT 20.9 (L) 10/15/2023 0548   HCT 21.7 (L) 01/12/2023 1607   PLT 381 10/15/2023 0548   PLT 321 01/12/2023 1607   MCV 105.0 (H) 10/15/2023 0548   MCV 94 01/12/2023 1607   NEUTROABS 3.2 10/12/2023 0204   NEUTROABS 3.8 01/12/2023 1607   LYMPHSABS 4.5 (H) 10/12/2023 0204   LYMPHSABS 5.6 (H) 01/12/2023 1607   MONOABS 0.9 10/12/2023 0204   EOSABS 0.1 10/12/2023 0204   EOSABS 0.2 01/12/2023 1607   BASOSABS 0.0 10/12/2023 0204   BASOSABS 0.1 01/12/2023 1607    No results found for this or any previous visit (from the past 240 hours).  Studies/Results: CT CHEST WO CONTRAST Result Date: 10/15/2023 CLINICAL DATA:  Chest pain.  History of sickle cell disease. EXAM: CT CHEST WITHOUT CONTRAST TECHNIQUE: Multidetector CT imaging of the chest was performed following the standard protocol without IV contrast. RADIATION DOSE REDUCTION: This exam was performed  according to the departmental dose-optimization program which includes automated exposure control, adjustment of the mA and/or kV according to patient size and/or use of iterative reconstruction technique. COMPARISON:  CTA chest dated 10/12/2023. FINDINGS: Cardiovascular: Cardiomegaly, unchanged. Similar trace pericardial effusion. Similar enlarged pulmonary trunk measuring up to 3.7 cm, which can be seen in the setting of pulmonary arterial hypertension. Stable right chest port catheter tip terminates at the level of the superior cavoatrial junction. Mediastinum/Nodes: Stable 1.4 cm subcarinal lymph  node (2:81). Similar prominent axillary nodes. The previously noted prominent right hilar lymph nodes are not well evaluated on this noncontrast examination. Redemonstrated substernal thymus. Thyroid gland, trachea, and esophagus demonstrate no significant findings. Lungs/Pleura: Redemonstrated extensive multifocal scarring with areas of architectural distortion chronic ground-glass changes, with a lower lung zone predominance. No new focal airspace opacity. No pleural effusion or pneumothorax. Upper Abdomen: No acute abnormality. Auto infarcted calcified spleen. Musculoskeletal: No acute osseous abnormality. Bony stigmata of sickle cell disease. IMPRESSION: 1. Similar extensive pulmonary parenchymal scarring, architectural distortion, and chronic ground-glass changes with a lower lung zone predominance. These findings could reflect sequela of chronic pulmonary infarcts. No new focal airspace opacity. 2. Stable mildly prominent subcarinal and axillary nodes. 3. Stable enlarged pulmonary trunk, which can be seen in the setting of pulmonary arterial hypertension. 4. Additional unchanged chronic findings, as above. Electronically Signed   By: Harrietta Sherry M.D.   On: 10/15/2023 13:57   DG Chest 2 View Result Date: 10/15/2023 CLINICAL DATA:  Chest pain.  History of sickle cell disease. EXAM: CHEST - 2 VIEW COMPARISON:  CTA chest dated 10/12/2023. Chest radiograph dated 08/10/2023. FINDINGS: Stable cardiomediastinal silhouette. Right chest Port-A-Cath tip overlies the lower SVC. Similar bibasilar predominant interstitial coarsening with underlying haziness, corresponding to chronic ground-glass changes as noted on the prior CT. No appreciable acute focal airspace consolidation. No pleural effusion or pneumothorax. No acute osseous abnormality. IMPRESSION: Similar bibasilar predominant interstitial coarsening with underlying haziness, corresponding to chronic ground-glass changes as noted on the prior CT. No  appreciable acute focal airspace consolidation. Electronically Signed   By: Harrietta Sherry M.D.   On: 10/15/2023 13:43     Medications: Scheduled Meds:  abacavir -dolutegravir -lamiVUDine   1 tablet Oral Daily   apixaban   5 mg Oral BID   Chlorhexidine  Gluconate Cloth  6 each Topical Daily   folic acid   0.5 mg Oral Daily   HYDROmorphone    Intravenous Q4H   hydroxyurea   2,000 mg Oral QHS   senna-docusate  2 tablet Oral QHS   sodium chloride  flush  10-40 mL Intracatheter Q12H   Continuous Infusions:   PRN Meds:.albuterol , benzocaine , cyclobenzaprine , diphenhydrAMINE , gabapentin , naloxone , ondansetron  (ZOFRAN ) IV, oxyCODONE -acetaminophen  **AND** oxyCODONE , pantoprazole , polyethylene glycol, sodium chloride  flush, sodium chloride  flush  Consultants: None  Procedures: None  Antibiotics: None  Assessment/Plan: Active Problems:   Chronic pain syndrome   Anxiety   Leukocytosis   Sickle cell anemia with pain (HCC)   Sickle cell crisis (HCC)   Hb Sickle Cell Disease with Pain crisis: Continue IVF 0.45% Saline @ 125 mls/hour, continue weight based Dilaudid  PCA, continue oral home pain medications as ordered. Monitor vitals very closely, Re-evaluate pain scale regularly, 2 L of Oxygen  by [place]. Patient encouraged to ambulate on the hallway today.  Leukocytosis: Stable Anemia of Chronic Disease: CBC 7.5 g/dL slightly lower than patient's baseline.  No need for transfusion at this time we will continue to monitor CBC. Chronic pain Syndrome: Continue oral home pain medication. Anxiety: Stable, continue medication as prescribed.  Code Status: Full Code Family Communication: N/A Disposition Plan: Not yet ready for discharge  [name] Cover NP If 7PM-7AM, please contact night-coverage.  10/15/2023, 2:54 PM  LOS: 3 days

## 2023-10-15 ENCOUNTER — Inpatient Hospital Stay (HOSPITAL_COMMUNITY): Payer: Self-pay

## 2023-10-15 LAB — CBC
HCT: 20.9 % — ABNORMAL LOW (ref 39.0–52.0)
Hemoglobin: 7.8 g/dL — ABNORMAL LOW (ref 13.0–17.0)
MCH: 39.2 pg — ABNORMAL HIGH (ref 26.0–34.0)
MCHC: 37.3 g/dL — ABNORMAL HIGH (ref 30.0–36.0)
MCV: 105 fL — ABNORMAL HIGH (ref 80.0–100.0)
Platelets: 381 10*3/uL (ref 150–400)
RBC: 1.99 MIL/uL — ABNORMAL LOW (ref 4.22–5.81)
RDW: 23.6 % — ABNORMAL HIGH (ref 11.5–15.5)
WBC: 9.8 10*3/uL (ref 4.0–10.5)
nRBC: 17.2 % — ABNORMAL HIGH (ref 0.0–0.2)

## 2023-10-15 LAB — COMPREHENSIVE METABOLIC PANEL WITH GFR
ALT: 13 U/L (ref 0–44)
AST: 23 U/L (ref 15–41)
Albumin: 3.6 g/dL (ref 3.5–5.0)
Alkaline Phosphatase: 55 U/L (ref 38–126)
Anion gap: 9 (ref 5–15)
BUN: 12 mg/dL (ref 6–20)
CO2: 26 mmol/L (ref 22–32)
Calcium: 8.8 mg/dL — ABNORMAL LOW (ref 8.9–10.3)
Chloride: 102 mmol/L (ref 98–111)
Creatinine, Ser: 1.04 mg/dL (ref 0.61–1.24)
GFR, Estimated: >60 mL/min (ref >60)
Glucose, Bld: 108 mg/dL — ABNORMAL HIGH (ref 70–99)
Potassium: 4 mmol/L (ref 3.5–5.1)
Sodium: 137 mmol/L (ref 135–145)
Total Bilirubin: 5.4 mg/dL — ABNORMAL HIGH (ref 0.0–1.2)
Total Protein: 7.9 g/dL (ref 6.5–8.1)

## 2023-10-15 MED ORDER — TOLNAFTATE 1 % EX POWD
Freq: Two times a day (BID) | CUTANEOUS | Status: AC
  Filled 2023-10-15: qty 45

## 2023-10-15 MED ORDER — ONDANSETRON HCL 4 MG/2ML IJ SOLN
4.0000 mg | Freq: Four times a day (QID) | INTRAMUSCULAR | Status: AC | PRN
  Administered 2023-10-15 – 2023-10-18 (×2): 4 mg via INTRAVENOUS
  Filled 2023-10-15 (×2): qty 2

## 2023-10-15 NOTE — Plan of Care (Signed)
 Patient is alert and oriented X4, ambulatory. On 3 L [place] now sating at 95%, no complains of SOB or difficulty breathing at this time. MD Notified, will continue to assess and monitor.  Problem: Education: Goal: Knowledge of vaso-occlusive preventative measures will improve Outcome: Progressing Goal: Awareness of infection prevention will improve Outcome: Progressing Goal: Awareness of signs and symptoms of anemia will improve Outcome: Progressing Goal: Long-term complications will improve Outcome: Progressing   Problem: Self-Care: Goal: Ability to incorporate actions that prevent/reduce pain crisis will improve Outcome: Progressing   Problem: Bowel/Gastric: Goal: Gut motility will be maintained Outcome: Progressing   Problem: Tissue Perfusion: Goal: Complications related to inadequate tissue perfusion will be avoided or minimized Outcome: Progressing   Problem: Respiratory: Goal: Pulmonary complications will be avoided or minimized Outcome: Progressing Goal: Acute Chest Syndrome will be identified early to prevent complications Outcome: Progressing   Problem: Fluid Volume: Goal: Ability to maintain a balanced intake and output will improve Outcome: Progressing   Problem: Sensory: Goal: Pain level will decrease with appropriate interventions Outcome: Progressing   Problem: Health Behavior: Goal: Postive changes in compliance with treatment and prescription regimens will improve Outcome: Progressing   Problem: Education: Goal: Knowledge of General Education information will improve Description: Including pain rating scale, medication(s)/side effects and non-pharmacologic comfort measures Outcome: Progressing   Problem: Health Behavior/Discharge Planning: Goal: Ability to manage health-related needs will improve Outcome: Progressing   Problem: Clinical Measurements: Goal: Ability to maintain clinical measurements within normal limits will improve Outcome:  Progressing Goal: Will remain free from infection Outcome: Progressing Goal: Diagnostic test results will improve Outcome: Progressing Goal: Respiratory complications will improve Outcome: Progressing Goal: Cardiovascular complication will be avoided Outcome: Progressing   Problem: Activity: Goal: Risk for activity intolerance will decrease Outcome: Progressing   Problem: Nutrition: Goal: Adequate nutrition will be maintained Outcome: Progressing   Problem: Coping: Goal: Level of anxiety will decrease Outcome: Progressing   Problem: Elimination: Goal: Will not experience complications related to bowel motility Outcome: Progressing Goal: Will not experience complications related to urinary retention Outcome: Progressing   Problem: Pain Managment: Goal: General experience of comfort will improve and/or be controlled Outcome: Progressing   Problem: Safety: Goal: Ability to remain free from injury will improve Outcome: Progressing   Problem: Skin Integrity: Goal: Risk for impaired skin integrity will decrease Outcome: Progressing

## 2023-10-15 NOTE — Progress Notes (Signed)
 Pt 02 in the 80's. This nurse increased pt O2 from 4 to 6L [place]. Providers and RRT notified. See orders.

## 2023-10-15 NOTE — Progress Notes (Signed)
 Patient ID: Martin [name], male   DOB: [birth year], 33 y.o.   MRN: [id number] Subjective: Martin [name]  is a 33 y.o. male with history of sickle cell disease,  chronic pain syndrome, opiate dependency and tolerance, history of HIV, and history of PE on Xarelto.  Patient presents to the emergency department with generalize body pain, chest pain and lower extremity pain. He is on 3l oxygen  at home.   Patient continues to report pain of 9/10 with shortness of breath. Oxygen  bumped up to 6L, PFT, CT angio and chest x-ray completed with results pending. Denies cough, fever, diarrhea /vomiting/diarrhea.  Encouraged to ambulate and use incentive spirometry   Objective:  Vital signs in last 24 hours:  Vitals:   10/15/23 0840 10/15/23 1121 10/15/23 1152 10/15/23 1400  BP:    111/75  Pulse:    79  Resp:   12 20  Temp:    98.3 F (36.8 C)  TempSrc:    Oral  SpO2: (S) 92% 95%  94%  Weight:      Height:        Intake/Output from previous day:  No intake or output data in the 24 hours ending 10/15/23 1458   Physical Exam: General: Alert, awake, oriented x3, in no acute distress.  HEENT: [place]/AT PEERL, EOMI Neck: Trachea midline,  no masses, no thyromegal,y no JVD, no carotid bruit OROPHARYNX:  Moist, No exudate/ erythema/lesions.  Heart: Regular rate and rhythm, without murmurs, rubs, gallops, PMI non-displaced, no heaves or thrills on palpation.  Lungs: Clear to auscultation, no wheezing or rhonchi noted. No increased vocal fremitus resonant to percussion  Abdomen: Soft, nontender, nondistended, positive bowel sounds, no masses no hepatosplenomegaly noted..  Neuro: No focal neurological deficits noted cranial nerves II through XII grossly intact. DTRs 2+ bilaterally upper and lower extremities. Strength 5 out of 5 in bilateral upper and lower extremities. Musculoskeletal: Generalized body tenderness.  Psychiatric: Patient alert and oriented x3, good insight and cognition, good recent  to remote recall. Lymph node survey: No cervical axillary or inguinal lymphadenopathy noted.  Lab Results:  Basic Metabolic Panel:    Component Value Date/Time   NA 137 10/15/2023 0548   NA 140 01/12/2023 1607   K 4.0 10/15/2023 0548   CL 102 10/15/2023 0548   CO2 26 10/15/2023 0548   BUN 12 10/15/2023 0548   BUN 8 01/12/2023 1607   CREATININE 1.04 10/15/2023 0548   GLUCOSE 108 (H) 10/15/2023 0548   CALCIUM  8.8 (L) 10/15/2023 0548   CBC:    Component Value Date/Time   WBC 9.8 10/15/2023 0548   HGB 7.8 (L) 10/15/2023 0548   HGB 7.4 (L) 01/12/2023 1607   HCT 20.9 (L) 10/15/2023 0548   HCT 21.7 (L) 01/12/2023 1607   PLT 381 10/15/2023 0548   PLT 321 01/12/2023 1607   MCV 105.0 (H) 10/15/2023 0548   MCV 94 01/12/2023 1607   NEUTROABS 3.2 10/12/2023 0204   NEUTROABS 3.8 01/12/2023 1607   LYMPHSABS 4.5 (H) 10/12/2023 0204   LYMPHSABS 5.6 (H) 01/12/2023 1607   MONOABS 0.9 10/12/2023 0204   EOSABS 0.1 10/12/2023 0204   EOSABS 0.2 01/12/2023 1607   BASOSABS 0.0 10/12/2023 0204   BASOSABS 0.1 01/12/2023 1607    No results found for this or any previous visit (from the past 240 hours).  Studies/Results: CT CHEST WO CONTRAST Result Date: 10/15/2023 CLINICAL DATA:  Chest pain.  History of sickle cell disease. EXAM: CT CHEST WITHOUT CONTRAST TECHNIQUE: Multidetector CT imaging  of the chest was performed following the standard protocol without IV contrast. RADIATION DOSE REDUCTION: This exam was performed according to the departmental dose-optimization program which includes automated exposure control, adjustment of the mA and/or kV according to patient size and/or use of iterative reconstruction technique. COMPARISON:  CTA chest dated 10/12/2023. FINDINGS: Cardiovascular: Cardiomegaly, unchanged. Similar trace pericardial effusion. Similar enlarged pulmonary trunk measuring up to 3.7 cm, which can be seen in the setting of pulmonary arterial hypertension. Stable right chest port  catheter tip terminates at the level of the superior cavoatrial junction. Mediastinum/Nodes: Stable 1.4 cm subcarinal lymph node (2:81). Similar prominent axillary nodes. The previously noted prominent right hilar lymph nodes are not well evaluated on this noncontrast examination. Redemonstrated substernal thymus. Thyroid gland, trachea, and esophagus demonstrate no significant findings. Lungs/Pleura: Redemonstrated extensive multifocal scarring with areas of architectural distortion chronic ground-glass changes, with a lower lung zone predominance. No new focal airspace opacity. No pleural effusion or pneumothorax. Upper Abdomen: No acute abnormality. Auto infarcted calcified spleen. Musculoskeletal: No acute osseous abnormality. Bony stigmata of sickle cell disease. IMPRESSION: 1. Similar extensive pulmonary parenchymal scarring, architectural distortion, and chronic ground-glass changes with a lower lung zone predominance. These findings could reflect sequela of chronic pulmonary infarcts. No new focal airspace opacity. 2. Stable mildly prominent subcarinal and axillary nodes. 3. Stable enlarged pulmonary trunk, which can be seen in the setting of pulmonary arterial hypertension. 4. Additional unchanged chronic findings, as above. Electronically Signed   By: Harrietta Sherry M.D.   On: 10/15/2023 13:57   DG Chest 2 View Result Date: 10/15/2023 CLINICAL DATA:  Chest pain.  History of sickle cell disease. EXAM: CHEST - 2 VIEW COMPARISON:  CTA chest dated 10/12/2023. Chest radiograph dated 08/10/2023. FINDINGS: Stable cardiomediastinal silhouette. Right chest Port-A-Cath tip overlies the lower SVC. Similar bibasilar predominant interstitial coarsening with underlying haziness, corresponding to chronic ground-glass changes as noted on the prior CT. No appreciable acute focal airspace consolidation. No pleural effusion or pneumothorax. No acute osseous abnormality. IMPRESSION: Similar bibasilar predominant  interstitial coarsening with underlying haziness, corresponding to chronic ground-glass changes as noted on the prior CT. No appreciable acute focal airspace consolidation. Electronically Signed   By: Harrietta Sherry M.D.   On: 10/15/2023 13:43     Medications: Scheduled Meds:  abacavir -dolutegravir -lamiVUDine   1 tablet Oral Daily   apixaban   5 mg Oral BID   Chlorhexidine  Gluconate Cloth  6 each Topical Daily   folic acid   0.5 mg Oral Daily   HYDROmorphone    Intravenous Q4H   hydroxyurea   2,000 mg Oral QHS   senna-docusate  2 tablet Oral QHS   sodium chloride  flush  10-40 mL Intracatheter Q12H   Continuous Infusions:   PRN Meds:.albuterol , benzocaine , cyclobenzaprine , diphenhydrAMINE , gabapentin , naloxone , ondansetron  (ZOFRAN ) IV, oxyCODONE -acetaminophen  **AND** oxyCODONE , pantoprazole , polyethylene glycol, sodium chloride  flush, sodium chloride  flush  Consultants: None  Procedures: None  Antibiotics: None  Assessment/Plan: Active Problems:   Chronic pain syndrome   Anxiety   Leukocytosis   Sickle cell anemia with pain (HCC)   Sickle cell crisis (HCC)   Hb Sickle Cell Disease with Pain crisis: Continue IVF 0.45% Saline @KVO , continue weight based Dilaudid  PCA, continue oral home pain medications as ordered. Monitor vitals very closely, Re-evaluate pain scale regularly, 2 L of Oxygen  by [place]. Patient encouraged to ambulate on the hallway today.  Leukocytosis: Stable Anemia of Chronic Disease: CBC 7.8 g/dL slightly lower than patient's baseline.  No need for transfusion at this time we will continue to  monitor CBC. Chronic pain Syndrome: Continue oral home pain medication. Anxiety: Stable, continue medication as prescribed.  Code Status: Full Code Family Communication: N/A Disposition Plan: Not yet ready for discharge  [name] Cover NP If 7PM-7AM, please contact night-coverage.  10/15/2023, 2:58 PM  LOS: 3 days

## 2023-10-15 NOTE — Progress Notes (Signed)
 Respiratory received an order for a PFT, which will have to be done outpatient @ [facility] or [facility].  We don't perform pulmonary function testing at [facility]. Message sent to MD, Jegede.   Thanks,  Vina Pounds, RRTRCP

## 2023-10-15 NOTE — Plan of Care (Signed)
  Problem: Self-Care: Goal: Ability to incorporate actions that prevent/reduce pain crisis will improve Outcome: Progressing   Problem: Tissue Perfusion: Goal: Complications related to inadequate tissue perfusion will be avoided or minimized Outcome: Progressing   Problem: Coping: Goal: Level of anxiety will decrease Outcome: Progressing   Problem: Pain Managment: Goal: General experience of comfort will improve and/or be controlled Outcome: Progressing

## 2023-10-16 DIAGNOSIS — D72829 Elevated white blood cell count, unspecified: Secondary | ICD-10-CM

## 2023-10-16 DIAGNOSIS — D5700 Hb-SS disease with crisis, unspecified: Secondary | ICD-10-CM

## 2023-10-16 DIAGNOSIS — G894 Chronic pain syndrome: Secondary | ICD-10-CM

## 2023-10-16 MED ORDER — BISACODYL 10 MG RE SUPP
10.0000 mg | Freq: Once | RECTAL | Status: AC
  Administered 2023-10-16: 10 mg via RECTAL
  Filled 2023-10-16: qty 1

## 2023-10-16 MED ORDER — CARMEX CLASSIC LIP BALM EX OINT
TOPICAL_OINTMENT | CUTANEOUS | Status: DC | PRN
  Filled 2023-10-16: qty 10

## 2023-10-16 NOTE — Progress Notes (Signed)
 SICKLE CELL SERVICE PROGRESS NOTE  Martin [name] FMW:[id number] DOB: 08-16-1990 DOA: 10/12/2023 PCP: [name], [name], [name]  Assessment/Plan: Active Problems:   Chronic pain syndrome   Anxiety   Leukocytosis   Sickle cell anemia with pain (HCC)   Sickle cell crisis (HCC)  Sickle cell pain crisis: Patient is still on Dilaudid  PCA, Toradol , IV fluids and his oral home medications.  Continue to monitor Anemia of chronic disease: Continue to monitor H&H. Leukocytosis: Continue to monitor white count. Anxiety disorder: Continue home regimen  Code Status: Full code Family Communication: No family at bedside Disposition Plan: Home  [facility]  Pager [phone]. If 7PM-7AM, please contact night-coverage.  10/16/2023, 1:44 PM  LOS: 4 days   Brief narrative: Martin [name]  is a 33 y.o. male with history of sickle cell disease,  chronic pain syndrome, opiate dependency and tolerance, history of HIV, and history of PE on Xarelto.  Patient presents to the emergency department with generalize body pain and lower extremity pain. Patient reports that he has been taking his oxycodone  at home without improvement. He is on 3l oxygen  at home. Denies cough, fever, chest pain, diarrhea /vomiting. He has had no sick contacts and no recent travels   Consultants: None  Procedures: Chest x-ray  Antibiotics: None  HPI/Subjective: Patient's pain is still at 8 out of 10.  No fever or chills no nausea vomiting or diarrhea.  No other complaints.  Objective: Vitals:   10/16/23 0430 10/16/23 0509 10/16/23 0720 10/16/23 1009  BP:  107/66    Pulse:  84    Resp: 16 16 13 10   Temp:  97.9 F (36.6 C)    TempSrc:  Oral    SpO2: 91% 94% 95% 92%  Weight:      Height:       Weight change:   Intake/Output Summary (Last 24 hours) at 10/16/2023 1344 Last data filed at 10/16/2023 0509 Gross per 24 hour  Intake 720 ml  Output 3125 ml  Net -2405 ml    General: Alert, awake, oriented x3, in  no acute distress.  HEENT: [place]/AT PEERL, EOMI Neck: Trachea midline,  no masses, no thyromegal,y no JVD, no carotid bruit OROPHARYNX:  Moist, No exudate/ erythema/lesions.  Heart: Regular rate and rhythm, without murmurs, rubs, gallops, PMI non-displaced, no heaves or thrills on palpation.  Lungs: Clear to auscultation, no wheezing or rhonchi noted. No increased vocal fremitus resonant to percussion  Abdomen: Soft, nontender, nondistended, positive bowel sounds, no masses no hepatosplenomegaly noted..  Neuro: No focal neurological deficits noted cranial nerves II through XII grossly intact. DTRs 2+ bilaterally upper and lower extremities. Strength 5 out of 5 in bilateral upper and lower extremities. Musculoskeletal: No warm swelling or erythema around joints, no spinal tenderness noted. Psychiatric: Patient alert and oriented x3, good insight and cognition, good recent to remote recall. Lymph node survey: No cervical axillary or inguinal lymphadenopathy noted.   Data Reviewed: Basic Metabolic Panel: Recent Labs  Lab 10/12/23 0204 10/15/23 0548  NA 137 137  K 3.5 4.0  CL 106 102  CO2 23 26  GLUCOSE 96 108*  BUN 12 12  CREATININE 0.74 1.04  CALCIUM  8.2* 8.8*   Liver Function Tests: Recent Labs  Lab 10/12/23 0204 10/15/23 0548  AST 34 23  ALT 19 13  ALKPHOS 55 55  BILITOT 11.5* 5.4*  PROT 7.6 7.9  ALBUMIN 3.4* 3.6   No results for input(s): LIPASE, AMYLASE in the last 168 hours. No results for  input(s): AMMONIA in the last 168 hours. CBC: Recent Labs  Lab 10/12/23 0204 10/13/23 0449 10/14/23 0519 10/15/23 0548  WBC 8.7 9.3 8.0 9.8  NEUTROABS 3.2  --   --   --   HGB 7.5* 7.4* 7.6* 7.8*  HCT 12.5* 20.7* 21.3* 20.9*  MCV 106.8* 108.9* 108.1* 105.0*  PLT 331 341 342 381   Cardiac Enzymes: No results for input(s): CKTOTAL, CKMB, CKMBINDEX, TROPONINI in the last 168 hours. BNP (last 3 results) No results for input(s): BNP in the last 8760  hours.  ProBNP (last 3 results) No results for input(s): PROBNP in the last 8760 hours.  CBG: No results for input(s): GLUCAP in the last 168 hours.  No results found for this or any previous visit (from the past 240 hours).   Studies: CT CHEST WO CONTRAST Result Date: 10/15/2023 CLINICAL DATA:  Chest pain.  History of sickle cell disease. EXAM: CT CHEST WITHOUT CONTRAST TECHNIQUE: Multidetector CT imaging of the chest was performed following the standard protocol without IV contrast. RADIATION DOSE REDUCTION: This exam was performed according to the departmental dose-optimization program which includes automated exposure control, adjustment of the mA and/or kV according to patient size and/or use of iterative reconstruction technique. COMPARISON:  CTA chest dated 10/12/2023. FINDINGS: Cardiovascular: Cardiomegaly, unchanged. Similar trace pericardial effusion. Similar enlarged pulmonary trunk measuring up to 3.7 cm, which can be seen in the setting of pulmonary arterial hypertension. Stable right chest port catheter tip terminates at the level of the superior cavoatrial junction. Mediastinum/Nodes: Stable 1.4 cm subcarinal lymph node (2:81). Similar prominent axillary nodes. The previously noted prominent right hilar lymph nodes are not well evaluated on this noncontrast examination. Redemonstrated substernal thymus. Thyroid gland, trachea, and esophagus demonstrate no significant findings. Lungs/Pleura: Redemonstrated extensive multifocal scarring with areas of architectural distortion chronic ground-glass changes, with a lower lung zone predominance. No new focal airspace opacity. No pleural effusion or pneumothorax. Upper Abdomen: No acute abnormality. Auto infarcted calcified spleen. Musculoskeletal: No acute osseous abnormality. Bony stigmata of sickle cell disease. IMPRESSION: 1. Similar extensive pulmonary parenchymal scarring, architectural distortion, and chronic ground-glass changes with a  lower lung zone predominance. These findings could reflect sequela of chronic pulmonary infarcts. No new focal airspace opacity. 2. Stable mildly prominent subcarinal and axillary nodes. 3. Stable enlarged pulmonary trunk, which can be seen in the setting of pulmonary arterial hypertension. 4. Additional unchanged chronic findings, as above. Electronically Signed   By: Harrietta Sherry M.D.   On: 10/15/2023 13:57   DG Chest 2 View Result Date: 10/15/2023 CLINICAL DATA:  Chest pain.  History of sickle cell disease. EXAM: CHEST - 2 VIEW COMPARISON:  CTA chest dated 10/12/2023. Chest radiograph dated 08/10/2023. FINDINGS: Stable cardiomediastinal silhouette. Right chest Port-A-Cath tip overlies the lower SVC. Similar bibasilar predominant interstitial coarsening with underlying haziness, corresponding to chronic ground-glass changes as noted on the prior CT. No appreciable acute focal airspace consolidation. No pleural effusion or pneumothorax. No acute osseous abnormality. IMPRESSION: Similar bibasilar predominant interstitial coarsening with underlying haziness, corresponding to chronic ground-glass changes as noted on the prior CT. No appreciable acute focal airspace consolidation. Electronically Signed   By: Harrietta Sherry M.D.   On: 10/15/2023 13:43   CT Angio Chest PE W and/or Wo Contrast Result Date: 10/12/2023 CLINICAL DATA:  Sickle cell crisis. Evaluate for pulmonary embolism. No relief of chest pain with usual home meds. EXAM: CT ANGIOGRAPHY CHEST WITH CONTRAST TECHNIQUE: Multidetector CT imaging of the chest was performed using the standard  protocol during bolus administration of intravenous contrast. Multiplanar CT image reconstructions and MIPs were obtained to evaluate the vascular anatomy. RADIATION DOSE REDUCTION: This exam was performed according to the departmental dose-optimization program which includes automated exposure control, adjustment of the mA and/or kV according to patient size  and/or use of iterative reconstruction technique. CONTRAST:  75mL OMNIPAQUE  IOHEXOL  350 MG/ML SOLN COMPARISON:  CTA chest 08/13/2023, chest CT without contrast 09/25/2022 FINDINGS: Cardiovascular: There is a right chest port with IJ approach catheter terminating about the superior cavoatrial junction. There is cardiomegaly with a right chamber predominance and RV/LV ratio of 1.3. There is contrast reflux into the IVC and hepatic veins which may be seen with right heart dysfunction or tricuspid regurgitation. There is a small pericardial effusion anteriorly to the right. Enlarged pulmonary trunk again measuring 3.7 cm indicating arterial hypertension. Respiratory motion, especially in the bases, largely obscures the subsegmental pulmonary arteries. No arterial embolus is seen at least to the segmental level. Pulmonary veins are nondistended. The aorta and great vessels are unremarkable. Mediastinum/Nodes: Stable mildly prominent right hilar lymph nodes, largest 1.3 cm in short axis on 5:61. Stable subcarinal lymph nodes up to 1.4 cm. Substernal thymus appears similar. The esophagus is patulous but not thickened. There are borderline prominent bilateral axillary space lymph nodes, also unchanged. The trachea and central airways are clear.  Negative thyroid gland. Lungs/Pleura: Extensive multifocal scarring change and architectural distortion both lungs with underlying chronic ground-glass disease, with the ground-glass opacities most apparent and confluent in the lower lung fields. No new or grossly active infiltrate is seen. There is diffuse bronchial thickening. No new opacity is seen or focal nodules. No pleural effusion. No pneumothorax. Upper Abdomen: Autoinfarcted calcified and shrunken spleen. No acute abnormality. Musculoskeletal: Dense bones consistent with sickle cell disease. Mild thoracic dextroscoliosis. No acute or other significant osseous findings or chest wall abnormality. Review of the MIP images  confirms the above findings. IMPRESSION: 1. No evidence of arterial embolus at least to the segmental level. Respiratory motion largely obscures the subsegmental pulmonary arteries. 2. Cardiomegaly with right chamber predominance and contrast reflux into the IVC and hepatic veins, the latter which may be seen with right heart dysfunction or tricuspid regurgitation. 3. Enlarged pulmonary trunk indicating arterial hypertension. 4. Extensive multifocal scarring change and architectural distortion both lungs with underlying chronic ground-glass disease. No new or grossly active infiltrate is seen. 5. Diffuse bronchial thickening. 6. Stable mildly prominent right hilar, subcarinal and axillary space lymph nodes. 7. Autoinfarcted calcified and shrunken spleen. 8. Dense bones consistent with sickle cell disease. Electronically Signed   By: Francis Quam M.D.   On: 10/12/2023 04:47    Scheduled Meds:  abacavir -dolutegravir -lamiVUDine   1 tablet Oral Daily   apixaban   5 mg Oral BID   Chlorhexidine  Gluconate Cloth  6 each Topical Daily   folic acid   0.5 mg Oral Daily   HYDROmorphone    Intravenous Q4H   hydroxyurea   2,000 mg Oral QHS   senna-docusate  2 tablet Oral QHS   sodium chloride  flush  10-40 mL Intracatheter Q12H   Continuous Infusions:  Active Problems:   Chronic pain syndrome   Anxiety   Leukocytosis   Sickle cell anemia with pain (HCC)   Sickle cell crisis (HCC)

## 2023-10-16 NOTE — Plan of Care (Signed)

## 2023-10-16 NOTE — Plan of Care (Signed)
  Problem: Self-Care: Goal: Ability to incorporate actions that prevent/reduce pain crisis will improve Outcome: Progressing   Problem: Bowel/Gastric: Goal: Gut motility will be maintained Outcome: Progressing   Problem: Fluid Volume: Goal: Ability to maintain a balanced intake and output will improve Outcome: Progressing   Problem: Sensory: Goal: Pain level will decrease with appropriate interventions Outcome: Progressing   Problem: Clinical Measurements: Goal: Will remain free from infection Outcome: Progressing

## 2023-10-16 NOTE — Plan of Care (Signed)
   Problem: Education: Goal: Knowledge of vaso-occlusive preventative measures will improve Outcome: Progressing Goal: Awareness of infection prevention will improve Outcome: Progressing Goal: Awareness of signs and symptoms of anemia will improve Outcome: Progressing Goal: Long-term complications will improve Outcome: Progressing

## 2023-10-17 LAB — CBC WITH DIFFERENTIAL/PLATELET
Abs Immature Granulocytes: 0.06 10*3/uL (ref 0.00–0.07)
Basophils Absolute: 0 10*3/uL (ref 0.0–0.1)
Basophils Relative: 0 %
Eosinophils Absolute: 0.2 10*3/uL (ref 0.0–0.5)
Eosinophils Relative: 2 %
HCT: 20.3 % — ABNORMAL LOW (ref 39.0–52.0)
Hemoglobin: 7.5 g/dL — ABNORMAL LOW (ref 13.0–17.0)
Immature Granulocytes: 1 %
Lymphocytes Relative: 55 %
Lymphs Abs: 5.3 10*3/uL — ABNORMAL HIGH (ref 0.7–4.0)
MCH: 39.1 pg — ABNORMAL HIGH (ref 26.0–34.0)
MCHC: 36.9 g/dL — ABNORMAL HIGH (ref 30.0–36.0)
MCV: 105.7 fL — ABNORMAL HIGH (ref 80.0–100.0)
Monocytes Absolute: 0.7 10*3/uL (ref 0.1–1.0)
Monocytes Relative: 7 %
Neutro Abs: 3.4 10*3/uL (ref 1.7–7.7)
Neutrophils Relative %: 35 %
Platelets: 460 10*3/uL — ABNORMAL HIGH (ref 150–400)
RBC: 1.92 MIL/uL — ABNORMAL LOW (ref 4.22–5.81)
RDW: 23.8 % — ABNORMAL HIGH (ref 11.5–15.5)
WBC: 9.7 10*3/uL (ref 4.0–10.5)
nRBC: 48.7 % — ABNORMAL HIGH (ref 0.0–0.2)

## 2023-10-17 LAB — COMPREHENSIVE METABOLIC PANEL WITH GFR
ALT: 12 U/L (ref 0–44)
AST: 32 U/L (ref 15–41)
Albumin: 3.6 g/dL (ref 3.5–5.0)
Alkaline Phosphatase: 56 U/L (ref 38–126)
Anion gap: 5 (ref 5–15)
BUN: 15 mg/dL (ref 6–20)
CO2: 25 mmol/L (ref 22–32)
Calcium: 8.3 mg/dL — ABNORMAL LOW (ref 8.9–10.3)
Chloride: 106 mmol/L (ref 98–111)
Creatinine, Ser: 0.92 mg/dL (ref 0.61–1.24)
GFR, Estimated: >60 mL/min (ref >60)
Glucose, Bld: 91 mg/dL (ref 70–99)
Potassium: 4.2 mmol/L (ref 3.5–5.1)
Sodium: 136 mmol/L (ref 135–145)
Total Bilirubin: 4.5 mg/dL — ABNORMAL HIGH (ref 0.0–1.2)
Total Protein: 7.7 g/dL (ref 6.5–8.1)

## 2023-10-17 NOTE — Progress Notes (Signed)
 SICKLE CELL SERVICE PROGRESS NOTE  Martin [name] FMW:[id number] DOB: [birth year] DOA: 10/12/2023 PCP: [name], [name], [name]  Assessment/Plan: Active Problems:   Chronic pain syndrome   Anxiety   Leukocytosis   Sickle cell anemia with pain (HCC)   Sickle cell crisis (HCC)  Sickle cell pain crisis: Patient still complaining of pain at 7 out of 10 in his back legs but said it has improved.  No fever or chills no nausea or vomiting.  Patient is still on Dilaudid  PCA, Toradol , IV fluids and his oral home medications.  Continue to monitor Anemia of chronic disease: Continue to monitor H&H. Leukocytosis: Continue to monitor white count. Anxiety disorder: Continue home regimen HIV disease: Stable.  Not on Treatment. 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  Continue Chronic hypoxia: Continue oxygen .  Code Status: Full code Family Communication: No family at bedside Disposition Plan: Home  [facility]  Pager [phone]. If 7PM-7AM, please contact night-coverage.  10/17/2023, 11:01 PM  LOS: 5 days   Brief narrative: Martin [name]  is a 33 y.o. male with history of sickle cell disease,  chronic pain syndrome, opiate dependency and tolerance, history of HIV, and history of PE on Xarelto.  Patient presents to the emergency department with generalize body pain and lower extremity pain. Patient reports that he has been taking his oxycodone  at home without improvement. He is on 3l oxygen  at home. Denies cough, fever, chest pain, diarrhea /vomiting. He has had no sick contacts and no recent travels   Consultants: None  Procedures: Chest x-ray  Antibiotics: None  HPI/Subjective: Patient's pain is still at 7 out of 10.  Has improved overnight but not at baseline.  No fever or chills no nausea vomiting or diarrhea.  No other complaints.  Objective: Vitals:   10/17/23 1812 10/17/23 2016 10/17/23 2218 10/17/23 2219  BP: 111/71     Pulse: 98     Resp: 16 12 12 14   Temp: 98.3 F (36.8 C)      TempSrc: Oral     SpO2: (!) 83%     Weight:      Height:       Weight change:   Intake/Output Summary (Last 24 hours) at 10/17/2023 2301 Last data filed at 10/17/2023 1742 Gross per 24 hour  Intake 850 ml  Output 1000 ml  Net -150 ml    General: Alert, awake, oriented x3, in no acute distress.  HEENT: [place]/AT PEERL, EOMI Neck: Trachea midline,  no masses, no thyromegal,y no JVD, no carotid bruit OROPHARYNX:  Moist, No exudate/ erythema/lesions.  Heart: Regular rate and rhythm, without murmurs, rubs, gallops, PMI non-displaced, no heaves or thrills on palpation.  Lungs: Clear to auscultation, no wheezing or rhonchi noted. No increased vocal fremitus resonant to percussion  Abdomen: Soft, nontender, nondistended, positive bowel sounds, no masses no hepatosplenomegaly noted..  Neuro: No focal neurological deficits noted cranial nerves II through XII grossly intact. DTRs 2+ bilaterally upper and lower extremities. Strength 5 out of 5 in bilateral upper and lower extremities. Musculoskeletal: No warm swelling or erythema around joints, no spinal tenderness noted. Psychiatric: Patient alert and oriented x3, good insight and cognition, good recent to remote recall. Lymph node survey: No cervical axillary or inguinal lymphadenopathy noted.   Data Reviewed: Basic Metabolic Panel: Recent Labs  Lab 10/12/23 0204 10/15/23 0548 10/17/23 1025  NA 137 137 136  K 3.5 4.0 4.2  CL 106 102 106  CO2 23 26 25   GLUCOSE 96 108* 91  BUN  12 12 15   CREATININE 0.74 1.04 0.92  CALCIUM  8.2* 8.8* 8.3*   Liver Function Tests: Recent Labs  Lab 10/12/23 0204 10/15/23 0548 10/17/23 1025  AST 34 23 32  ALT 19 13 12   ALKPHOS 55 55 56  BILITOT 11.5* 5.4* 4.5*  PROT 7.6 7.9 7.7  ALBUMIN 3.4* 3.6 3.6   No results for input(s): LIPASE, AMYLASE in the last 168 hours. No results for input(s): AMMONIA in the last 168 hours. CBC: Recent Labs  Lab 10/12/23 0204 10/13/23 0449 10/14/23 0519  10/15/23 0548 10/17/23 1025  WBC 8.7 9.3 8.0 9.8 9.7  NEUTROABS 3.2  --   --   --  3.4  HGB 7.5* 7.4* 7.6* 7.8* 7.5*  HCT 12.5* 20.7* 21.3* 20.9* 20.3*  MCV 106.8* 108.9* 108.1* 105.0* 105.7*  PLT 331 341 342 381 460*   Cardiac Enzymes: No results for input(s): CKTOTAL, CKMB, CKMBINDEX, TROPONINI in the last 168 hours. BNP (last 3 results) No results for input(s): BNP in the last 8760 hours.  ProBNP (last 3 results) No results for input(s): PROBNP in the last 8760 hours.  CBG: No results for input(s): GLUCAP in the last 168 hours.  No results found for this or any previous visit (from the past 240 hours).   Studies: CT CHEST WO CONTRAST Result Date: 10/15/2023 CLINICAL DATA:  Chest pain.  History of sickle cell disease. EXAM: CT CHEST WITHOUT CONTRAST TECHNIQUE: Multidetector CT imaging of the chest was performed following the standard protocol without IV contrast. RADIATION DOSE REDUCTION: This exam was performed according to the departmental dose-optimization program which includes automated exposure control, adjustment of the mA and/or kV according to patient size and/or use of iterative reconstruction technique. COMPARISON:  CTA chest dated 10/12/2023. FINDINGS: Cardiovascular: Cardiomegaly, unchanged. Similar trace pericardial effusion. Similar enlarged pulmonary trunk measuring up to 3.7 cm, which can be seen in the setting of pulmonary arterial hypertension. Stable right chest port catheter tip terminates at the level of the superior cavoatrial junction. Mediastinum/Nodes: Stable 1.4 cm subcarinal lymph node (2:81). Similar prominent axillary nodes. The previously noted prominent right hilar lymph nodes are not well evaluated on this noncontrast examination. Redemonstrated substernal thymus. Thyroid gland, trachea, and esophagus demonstrate no significant findings. Lungs/Pleura: Redemonstrated extensive multifocal scarring with areas of architectural distortion chronic  ground-glass changes, with a lower lung zone predominance. No new focal airspace opacity. No pleural effusion or pneumothorax. Upper Abdomen: No acute abnormality. Auto infarcted calcified spleen. Musculoskeletal: No acute osseous abnormality. Bony stigmata of sickle cell disease. IMPRESSION: 1. Similar extensive pulmonary parenchymal scarring, architectural distortion, and chronic ground-glass changes with a lower lung zone predominance. These findings could reflect sequela of chronic pulmonary infarcts. No new focal airspace opacity. 2. Stable mildly prominent subcarinal and axillary nodes. 3. Stable enlarged pulmonary trunk, which can be seen in the setting of pulmonary arterial hypertension. 4. Additional unchanged chronic findings, as above. Electronically Signed   By: Harrietta Sherry M.D.   On: 10/15/2023 13:57   DG Chest 2 View Result Date: 10/15/2023 CLINICAL DATA:  Chest pain.  History of sickle cell disease. EXAM: CHEST - 2 VIEW COMPARISON:  CTA chest dated 10/12/2023. Chest radiograph dated 08/10/2023. FINDINGS: Stable cardiomediastinal silhouette. Right chest Port-A-Cath tip overlies the lower SVC. Similar bibasilar predominant interstitial coarsening with underlying haziness, corresponding to chronic ground-glass changes as noted on the prior CT. No appreciable acute focal airspace consolidation. No pleural effusion or pneumothorax. No acute osseous abnormality. IMPRESSION: Similar bibasilar predominant interstitial coarsening with underlying  haziness, corresponding to chronic ground-glass changes as noted on the prior CT. No appreciable acute focal airspace consolidation. Electronically Signed   By: Harrietta Sherry M.D.   On: 10/15/2023 13:43   CT Angio Chest PE W and/or Wo Contrast Result Date: 10/12/2023 CLINICAL DATA:  Sickle cell crisis. Evaluate for pulmonary embolism. No relief of chest pain with usual home meds. EXAM: CT ANGIOGRAPHY CHEST WITH CONTRAST TECHNIQUE: Multidetector CT imaging  of the chest was performed using the standard protocol during bolus administration of intravenous contrast. Multiplanar CT image reconstructions and MIPs were obtained to evaluate the vascular anatomy. RADIATION DOSE REDUCTION: This exam was performed according to the departmental dose-optimization program which includes automated exposure control, adjustment of the mA and/or kV according to patient size and/or use of iterative reconstruction technique. CONTRAST:  75mL OMNIPAQUE  IOHEXOL  350 MG/ML SOLN COMPARISON:  CTA chest 08/13/2023, chest CT without contrast 09/25/2022 FINDINGS: Cardiovascular: There is a right chest port with IJ approach catheter terminating about the superior cavoatrial junction. There is cardiomegaly with a right chamber predominance and RV/LV ratio of 1.3. There is contrast reflux into the IVC and hepatic veins which may be seen with right heart dysfunction or tricuspid regurgitation. There is a small pericardial effusion anteriorly to the right. Enlarged pulmonary trunk again measuring 3.7 cm indicating arterial hypertension. Respiratory motion, especially in the bases, largely obscures the subsegmental pulmonary arteries. No arterial embolus is seen at least to the segmental level. Pulmonary veins are nondistended. The aorta and great vessels are unremarkable. Mediastinum/Nodes: Stable mildly prominent right hilar lymph nodes, largest 1.3 cm in short axis on 5:61. Stable subcarinal lymph nodes up to 1.4 cm. Substernal thymus appears similar. The esophagus is patulous but not thickened. There are borderline prominent bilateral axillary space lymph nodes, also unchanged. The trachea and central airways are clear.  Negative thyroid gland. Lungs/Pleura: Extensive multifocal scarring change and architectural distortion both lungs with underlying chronic ground-glass disease, with the ground-glass opacities most apparent and confluent in the lower lung fields. No new or grossly active infiltrate  is seen. There is diffuse bronchial thickening. No new opacity is seen or focal nodules. No pleural effusion. No pneumothorax. Upper Abdomen: Autoinfarcted calcified and shrunken spleen. No acute abnormality. Musculoskeletal: Dense bones consistent with sickle cell disease. Mild thoracic dextroscoliosis. No acute or other significant osseous findings or chest wall abnormality. Review of the MIP images confirms the above findings. IMPRESSION: 1. No evidence of arterial embolus at least to the segmental level. Respiratory motion largely obscures the subsegmental pulmonary arteries. 2. Cardiomegaly with right chamber predominance and contrast reflux into the IVC and hepatic veins, the latter which may be seen with right heart dysfunction or tricuspid regurgitation. 3. Enlarged pulmonary trunk indicating arterial hypertension. 4. Extensive multifocal scarring change and architectural distortion both lungs with underlying chronic ground-glass disease. No new or grossly active infiltrate is seen. 5. Diffuse bronchial thickening. 6. Stable mildly prominent right hilar, subcarinal and axillary space lymph nodes. 7. Autoinfarcted calcified and shrunken spleen. 8. Dense bones consistent with sickle cell disease. Electronically Signed   By: Francis Quam M.D.   On: 10/12/2023 04:47    Scheduled Meds:  abacavir -dolutegravir -lamiVUDine   1 tablet Oral Daily   apixaban   5 mg Oral BID   Chlorhexidine  Gluconate Cloth  6 each Topical Daily   folic acid   0.5 mg Oral Daily   HYDROmorphone    Intravenous Q4H   hydroxyurea   2,000 mg Oral QHS   senna-docusate  2 tablet Oral QHS  sodium chloride  flush  10-40 mL Intracatheter Q12H   Continuous Infusions:  Active Problems:   Chronic pain syndrome   Anxiety   Leukocytosis   Sickle cell anemia with pain (HCC)   Sickle cell crisis (HCC)

## 2023-10-17 NOTE — Plan of Care (Signed)
  Problem: Education: Goal: Knowledge of vaso-occlusive preventative measures will improve Outcome: Progressing Goal: Awareness of infection prevention will improve Outcome: Progressing Goal: Long-term complications will improve Outcome: Progressing   Problem: Self-Care: Goal: Ability to incorporate actions that prevent/reduce pain crisis will improve Outcome: Progressing   Problem: Tissue Perfusion: Goal: Complications related to inadequate tissue perfusion will be avoided or minimized Outcome: Progressing   Problem: Respiratory: Goal: Pulmonary complications will be avoided or minimized Outcome: Progressing Goal: Acute Chest Syndrome will be identified early to prevent complications Outcome: Progressing   Problem: Sensory: Goal: Pain level will decrease with appropriate interventions Outcome: Progressing   Problem: Health Behavior: Goal: Postive changes in compliance with treatment and prescription regimens will improve Outcome: Progressing   Problem: Education: Goal: Knowledge of General Education information will improve Description: Including pain rating scale, medication(s)/side effects and non-pharmacologic comfort measures Outcome: Progressing   Problem: Health Behavior/Discharge Planning: Goal: Ability to manage health-related needs will improve Outcome: Progressing   Problem: Clinical Measurements: Goal: Ability to maintain clinical measurements within normal limits will improve Outcome: Progressing Goal: Will remain free from infection Outcome: Progressing Goal: Diagnostic test results will improve Outcome: Progressing Goal: Respiratory complications will improve Outcome: Progressing   Problem: Nutrition: Goal: Adequate nutrition will be maintained Outcome: Progressing   Problem: Elimination: Goal: Will not experience complications related to bowel motility Outcome: Progressing   Problem: Pain Managment: Goal: General experience of comfort will  improve and/or be controlled Outcome: Progressing   Problem: Safety: Goal: Ability to remain free from injury will improve Outcome: Progressing

## 2023-10-18 ENCOUNTER — Other Ambulatory Visit: Payer: Self-pay | Admitting: Nurse Practitioner

## 2023-10-18 MED ORDER — BISACODYL 10 MG RE SUPP
10.0000 mg | Freq: Once | RECTAL | Status: AC
  Administered 2023-10-18: 10 mg via RECTAL
  Filled 2023-10-18: qty 1

## 2023-10-18 NOTE — Plan of Care (Signed)
  Problem: Education: Goal: Knowledge of vaso-occlusive preventative measures will improve Outcome: Progressing Goal: Long-term complications will improve Outcome: Progressing   Problem: Tissue Perfusion: Goal: Complications related to inadequate tissue perfusion will be avoided or minimized Outcome: Progressing   Problem: Respiratory: Goal: Acute Chest Syndrome will be identified early to prevent complications Outcome: Progressing   Problem: Sensory: Goal: Pain level will decrease with appropriate interventions Outcome: Progressing   Problem: Health Behavior: Goal: Postive changes in compliance with treatment and prescription regimens will improve Outcome: Progressing   Problem: Health Behavior/Discharge Planning: Goal: Ability to manage health-related needs will improve Outcome: Progressing   Problem: Clinical Measurements: Goal: Ability to maintain clinical measurements within normal limits will improve Outcome: Progressing Goal: Will remain free from infection Outcome: Progressing Goal: Diagnostic test results will improve Outcome: Progressing   Problem: Activity: Goal: Risk for activity intolerance will decrease Outcome: Progressing   Problem: Elimination: Goal: Will not experience complications related to bowel motility Outcome: Progressing   Problem: Pain Managment: Goal: General experience of comfort will improve and/or be controlled Outcome: Progressing

## 2023-10-18 NOTE — Progress Notes (Addendum)
 Patient ID: Martin [name], male   DOB: 1991-03-29, 33 y.o.   MRN: [id number] Subjective: Martin [name]  is a 33 y.o. male with history of sickle cell disease,  chronic pain syndrome, opiate dependency and tolerance, history of HIV, and history of PE on Xarelto.  Patient presents to the emergency department with generalize body pain, chest pain and lower extremity pain. He is on 3l oxygen  at home.   Patient continues to report pain of 7/10.  No new concerns Denies cough, fever, diarrhea /vomiting/diarrhea. He has had no sick contacts and no recent travels.    Objective:  Vital signs in last 24 hours:  Vitals:   10/18/23 0450 10/18/23 0945 10/18/23 0952 10/18/23 1203  BP:  110/70    Pulse:  69    Resp: 15 12 (!) 8 17  Temp:  98.7 F (37.1 C)    TempSrc:  Oral    SpO2: 94% 92%  95%  Weight:      Height:        Intake/Output from previous day:   Intake/Output Summary (Last 24 hours) at 10/18/2023 1349 Last data filed at 10/18/2023 1007 Gross per 24 hour  Intake 960 ml  Output 1140 ml  Net -180 ml    Physical Exam: General: Alert, awake, oriented x3, in no acute distress.  HEENT: [place]/AT PEERL, EOMI Neck: Trachea midline,  no masses, no thyromegal,y no JVD, no carotid bruit OROPHARYNX:  Moist, No exudate/ erythema/lesions.  Heart: Regular rate and rhythm, without murmurs, rubs, gallops, PMI non-displaced, no heaves or thrills on palpation.  Lungs: Clear to auscultation, no wheezing or rhonchi noted. No increased vocal fremitus resonant to percussion  Abdomen: Soft, nontender, nondistended, positive bowel sounds, no masses no hepatosplenomegaly noted..  Neuro: No focal neurological deficits noted cranial nerves II through XII grossly intact. DTRs 2+ bilaterally upper and lower extremities. Strength 5 out of 5 in bilateral upper and lower extremities. Musculoskeletal: Generalized body tenderness.  Psychiatric: Patient alert and oriented x3, good insight and cognition, good  recent to remote recall. Lymph node survey: No cervical axillary or inguinal lymphadenopathy noted.  Lab Results:  Basic Metabolic Panel:    Component Value Date/Time   NA 136 10/17/2023 1025   NA 140 01/12/2023 1607   K 4.2 10/17/2023 1025   CL 106 10/17/2023 1025   CO2 25 10/17/2023 1025   BUN 15 10/17/2023 1025   BUN 8 01/12/2023 1607   CREATININE 0.92 10/17/2023 1025   GLUCOSE 91 10/17/2023 1025   CALCIUM  8.3 (L) 10/17/2023 1025   CBC:    Component Value Date/Time   WBC 9.7 10/17/2023 1025   HGB 7.5 (L) 10/17/2023 1025   HGB 7.4 (L) 01/12/2023 1607   HCT 20.3 (L) 10/17/2023 1025   HCT 21.7 (L) 01/12/2023 1607   PLT 460 (H) 10/17/2023 1025   PLT 321 01/12/2023 1607   MCV 105.7 (H) 10/17/2023 1025   MCV 94 01/12/2023 1607   NEUTROABS 3.4 10/17/2023 1025   NEUTROABS 3.8 01/12/2023 1607   LYMPHSABS 5.3 (H) 10/17/2023 1025   LYMPHSABS 5.6 (H) 01/12/2023 1607   MONOABS 0.7 10/17/2023 1025   EOSABS 0.2 10/17/2023 1025   EOSABS 0.2 01/12/2023 1607   BASOSABS 0.0 10/17/2023 1025   BASOSABS 0.1 01/12/2023 1607    No results found for this or any previous visit (from the past 240 hours).  Studies/Results: No results found.   Medications: Scheduled Meds:  abacavir -dolutegravir -lamiVUDine   1 tablet Oral Daily   apixaban   5 mg Oral BID   Chlorhexidine  Gluconate Cloth  6 each Topical Daily   folic acid   0.5 mg Oral Daily   HYDROmorphone    Intravenous Q4H   hydroxyurea   2,000 mg Oral QHS   senna-docusate  2 tablet Oral QHS   sodium chloride  flush  10-40 mL Intracatheter Q12H   Continuous Infusions:   PRN Meds:.albuterol , benzocaine , cyclobenzaprine , diphenhydrAMINE , gabapentin , lip balm, naloxone , ondansetron  (ZOFRAN ) IV, oxyCODONE -acetaminophen  **AND** oxyCODONE , pantoprazole , polyethylene glycol, sodium chloride  flush, sodium chloride  flush  Consultants: None  Procedures: None  Antibiotics: None  Assessment/Plan: Active Problems:   Chronic pain  syndrome   Anxiety   Leukocytosis   Sickle cell anemia with pain (HCC)   Sickle cell crisis (HCC)   Hb Sickle Cell Disease with Pain crisis: Continue IVF 0.45% Saline @KVO .  Continue weight based Dilaudid  PCA, continue oral home pain medications as ordered. Monitor vitals very closely, Re-evaluate pain scale regularly, 2 L of Oxygen  by [place]. Patient encouraged to ambulate on the hallway today.  Leukocytosis: Stable Anemia of Chronic Disease: CBC 7. 8 g/dL slightly lower than patient's baseline.  No need for transfusion at this time we will continue to monitor CBC. Chronic pain Syndrome: Continue oral home pain medication. Anxiety: Stable, continue medication as prescribed.  Code Status: Full Code Family Communication: N/A Disposition Plan: Ready for discharge in the a.m.  [name] Cover NP If 7PM-7AM, please contact night-coverage.  10/18/2023, 1:49 PM  LOS: 6 days

## 2023-10-19 ENCOUNTER — Other Ambulatory Visit: Payer: Self-pay | Admitting: Nurse Practitioner

## 2023-10-19 ENCOUNTER — Other Ambulatory Visit (HOSPITAL_COMMUNITY): Payer: Self-pay

## 2023-10-19 DIAGNOSIS — F1190 Opioid use, unspecified, uncomplicated: Secondary | ICD-10-CM

## 2023-10-19 DIAGNOSIS — D571 Sickle-cell disease without crisis: Secondary | ICD-10-CM

## 2023-10-19 MED ORDER — OXYCODONE-ACETAMINOPHEN 10-325 MG PO TABS
1.0000 | ORAL_TABLET | ORAL | 0 refills | Status: DC | PRN
  Filled 2023-10-19: qty 90, 15d supply, fill #0

## 2023-10-19 MED ORDER — HEPARIN SOD (PORK) LOCK FLUSH 100 UNIT/ML IV SOLN
500.0000 [IU] | Freq: Once | INTRAVENOUS | Status: AC
  Administered 2023-10-19: 500 [IU] via INTRAVENOUS
  Filled 2023-10-19: qty 5

## 2023-10-19 NOTE — Plan of Care (Signed)

## 2023-10-19 NOTE — Discharge Summary (Signed)
 Physician Discharge Summary  Martin [name] FMW:[id number] DOB: [birth year] DOA: 10/12/2023  PCP: [name], [name], [name]  Admit date: 10/12/2023  Discharge date: 10/19/2023  Discharge Diagnoses:  Active Problems:   Chronic pain syndrome   Anxiety   Leukocytosis   Sickle cell anemia with pain (HCC)   Sickle cell crisis (HCC)   Discharge Condition: Stable  Disposition:  Pt is discharged home in good condition and is to follow up with [name], [name], [name] this week to have labs evaluated. Martin [name] is instructed to increase activity slowly and balance with rest for the next few days, and use prescribed medication to complete treatment of pain  Diet: Regular Wt Readings from Last 3 Encounters:  10/12/23 64.4 kg  10/03/23 63.5 kg  08/10/23 59 kg    History of present illness:  Martin [name]  is a 33 y.o. male with history of sickle cell disease,  chronic pain syndrome, opiate dependency and tolerance, history of HIV, and history of PE on Xarelto.  Patient presented to the emergency department with generalize body pain and lower extremity pain. Patient reports that he has been taking his oxycodone  at home without improvement. He is on 3l oxygen  at home. Denies cough, fever, chest pain, diarrhea /vomiting, no urinary symptoms.. He has had no sick contacts and no recent travels   ED Course:  Patient was treated in the ED with IVF, IV pain medication with no improvement to symptoms. He is admitted in-patient for ongoing sickle cell pain management.  Blood pressure 114/78, pulse 77, temperature (!) 97.5 F (36.4 C), temperature source Oral, resp. rate 16, height 6' 3 (1.905 m), weight 64.4 kg, SpO2 93%.       Labs Reviewed  COMPREHENSIVE METABOLIC PANEL WITH GFR - Abnormal; Notable for the following components:      Result Value     Calcium  8.2 (*)      Albumin 3.4 (*)      Total Bilirubin 11.5 (*)      All other components within normal limits  CBC WITH  DIFFERENTIAL/PLATELET - Abnormal; Notable for the following components:    RBC 1.17 (*)      Hemoglobin 7.5 (*)      HCT 12.5 (*)      MCV 106.8 (*)      MCH 64.1 (*)      MCHC 60.0 (*)      RDW 30.3 (*)      nRBC 33.9 (*)      Lymphs Abs 4.5 (*)      All other components within normal limits  RETICULOCYTES - Abnormal; Notable for the following components:    Retic Ct Pct 14.8 (*)      RBC. 1.68 (*)      Retic Count, Absolute 249.0 (*)      Immature Retic Fract 19.9 (*)      All other components within normal limits    Hospital Course:  Patient was admitted for sickle cell pain crisis and managed appropriately with IVF, IV Dilaudid  via PCA as well as other adjunct therapies per sickle cell pain management protocols.  Patient is reporting significant improvement to pain at baseline.  He is ambulating without assistance.  Tolerating p.o. without nausea or vomiting.  Patient request to be discharged. Patient was therefore discharged home today in a hemodynamically stable condition.   Martin [name] will follow-up with PCP within 1 week of this discharge. Martin [name] was counseled extensively about nonpharmacologic means of pain management,  patient verbalized understanding and was appreciative of  the care received during this admission.   We discussed the need for good hydration, monitoring of hydration status, avoidance of heat, cold, stress, and infection triggers. We discussed the need to be adherent with taking other home medications. Patient was reminded of the need to seek medical attention immediately if any symptom of bleeding, anemia, or infection occurs.  Discharge Exam: Vitals:   10/19/23 1030 10/19/23 1255  BP:  97/68  Pulse:  79  Resp: 16 16  Temp:  (!) 97.5 F (36.4 C)  SpO2: 91% (!) 88%   Vitals:   10/19/23 0730 10/19/23 0806 10/19/23 1030 10/19/23 1255  BP:  113/75  97/68  Pulse:  77  79  Resp: 14 20 16 16   Temp:  (!) 97.5 F (36.4 C)  (!) 97.5 F (36.4 C)  TempSrc:   Oral  Oral  SpO2: 90% 90% 91% (!) 88%  Weight:      Height:        General appearance : Awake, alert, not in any distress. Speech Clear. Not toxic looking HEENT: Atraumatic and Normocephalic, pupils equally reactive to light and accomodation Neck: Supple, no JVD. No cervical lymphadenopathy.  Chest: Good air entry bilaterally, no added sounds  CVS: S1 S2 regular, no murmurs.  Abdomen: Bowel sounds present, Non tender and not distended with no gaurding, rigidity or rebound. Extremities: B/L Lower Ext shows no edema, both legs are warm to touch Neurology: Awake alert, and oriented X 3, CN II-XII intact, Non focal Skin: No Rash  Discharge Instructions  Discharge Instructions     Call MD for:  severe uncontrolled pain   Complete by: As directed    Call MD for:  temperature >100.4   Complete by: As directed    Diet - low sodium heart healthy   Complete by: As directed    Increase activity slowly   Complete by: As directed    Increase activity slowly   Complete by: As directed       Allergies as of 10/19/2023       Reactions   Ketorolac  Tromethamine  Swelling, Other (See Comments)   Patient reports facial edema and left arm edema after administration.   Tape Rash, Other (See Comments)   PLEASE DO NOT USE THE CLEAR, THICK, PLASTIC TAPE- only paper tape is tolerated    Wound Dressing Adhesive Rash        Medication List     TAKE these medications    albuterol  108 (90 Base) MCG/ACT inhaler Commonly known as: VENTOLIN  HFA Inhale 2 puffs into the lungs every 6 (six) hours as needed for wheezing or shortness of breath.   cyclobenzaprine  10 MG tablet Commonly known as: FLEXERIL  Take 10 mg by mouth 2 (two) times daily as needed for muscle spasms.   Deferiprone  500 MG Tabs Commonly known as: Ferriprox  Take 1 tablet by mouth daily.   diphenhydrAMINE  25 MG tablet Commonly known as: BENADRYL  Take 1 tablet (25 mg total) by mouth every 4 (four) hours as needed for  itching.   Eliquis  5 MG Tabs tablet Generic drug: apixaban  Take 1 tablet (5 mg total) by mouth 2 (two) times daily.   folic acid  400 MCG tablet Commonly known as: FOLVITE  Take 1 tablet (400 mcg total) by mouth daily.   gabapentin  300 MG capsule Commonly known as: NEURONTIN  Take 1 capsule (300 mg total) by mouth at bedtime as needed (nerve pain).   guaiFENesin  600 MG 12 hr  tablet Commonly known as: Mucinex  Take 1 tablet (600 mg total) by mouth 2 (two) times daily. What changed:  when to take this reasons to take this   hydroxyurea  500 MG capsule Commonly known as: HYDREA  Take 4 capsules (2,000 mg total) by mouth at bedtime.   Narcan  4 MG/0.1ML Liqd nasal spray kit Generic drug: naloxone  Place 1 spray into the nose once as needed (as directed).   oxyCODONE -acetaminophen  10-325 MG tablet Commonly known as: PERCOCET Take 1 tablet by mouth every 4 (four) hours as needed for pain.   pantoprazole  40 MG tablet Commonly known as: PROTONIX  Take 1 tablet (40 mg total) by mouth daily as needed (acid reflux).   senna-docusate 8.6-50 MG tablet Commonly known as: Senokot-S Take 2 tablets by mouth at bedtime.   triamcinolone  cream 0.1 % Commonly known as: KENALOG  Apply 1 Application topically 2 (two) times daily. What changed:  when to take this reasons to take this   Triumeq  600-50-300 MG tablet Generic drug: abacavir -dolutegravir -lamiVUDine  Take 1 tablet by mouth daily.        The results of significant diagnostics from this hospitalization (including imaging, microbiology, ancillary and laboratory) are listed below for reference.    Significant Diagnostic Studies: CT CHEST WO CONTRAST Result Date: 10/15/2023 CLINICAL DATA:  Chest pain.  History of sickle cell disease. EXAM: CT CHEST WITHOUT CONTRAST TECHNIQUE: Multidetector CT imaging of the chest was performed following the standard protocol without IV contrast. RADIATION DOSE REDUCTION: This exam was performed  according to the departmental dose-optimization program which includes automated exposure control, adjustment of the mA and/or kV according to patient size and/or use of iterative reconstruction technique. COMPARISON:  CTA chest dated 10/12/2023. FINDINGS: Cardiovascular: Cardiomegaly, unchanged. Similar trace pericardial effusion. Similar enlarged pulmonary trunk measuring up to 3.7 cm, which can be seen in the setting of pulmonary arterial hypertension. Stable right chest port catheter tip terminates at the level of the superior cavoatrial junction. Mediastinum/Nodes: Stable 1.4 cm subcarinal lymph node (2:81). Similar prominent axillary nodes. The previously noted prominent right hilar lymph nodes are not well evaluated on this noncontrast examination. Redemonstrated substernal thymus. Thyroid gland, trachea, and esophagus demonstrate no significant findings. Lungs/Pleura: Redemonstrated extensive multifocal scarring with areas of architectural distortion chronic ground-glass changes, with a lower lung zone predominance. No new focal airspace opacity. No pleural effusion or pneumothorax. Upper Abdomen: No acute abnormality. Auto infarcted calcified spleen. Musculoskeletal: No acute osseous abnormality. Bony stigmata of sickle cell disease. IMPRESSION: 1. Similar extensive pulmonary parenchymal scarring, architectural distortion, and chronic ground-glass changes with a lower lung zone predominance. These findings could reflect sequela of chronic pulmonary infarcts. No new focal airspace opacity. 2. Stable mildly prominent subcarinal and axillary nodes. 3. Stable enlarged pulmonary trunk, which can be seen in the setting of pulmonary arterial hypertension. 4. Additional unchanged chronic findings, as above. Electronically Signed   By: Harrietta Sherry M.D.   On: 10/15/2023 13:57   DG Chest 2 View Result Date: 10/15/2023 CLINICAL DATA:  Chest pain.  History of sickle cell disease. EXAM: CHEST - 2 VIEW COMPARISON:   CTA chest dated 10/12/2023. Chest radiograph dated 08/10/2023. FINDINGS: Stable cardiomediastinal silhouette. Right chest Port-A-Cath tip overlies the lower SVC. Similar bibasilar predominant interstitial coarsening with underlying haziness, corresponding to chronic ground-glass changes as noted on the prior CT. No appreciable acute focal airspace consolidation. No pleural effusion or pneumothorax. No acute osseous abnormality. IMPRESSION: Similar bibasilar predominant interstitial coarsening with underlying haziness, corresponding to chronic ground-glass changes as noted on the  prior CT. No appreciable acute focal airspace consolidation. Electronically Signed   By: Harrietta Sherry M.D.   On: 10/15/2023 13:43   CT Angio Chest PE W and/or Wo Contrast Result Date: 10/12/2023 CLINICAL DATA:  Sickle cell crisis. Evaluate for pulmonary embolism. No relief of chest pain with usual home meds. EXAM: CT ANGIOGRAPHY CHEST WITH CONTRAST TECHNIQUE: Multidetector CT imaging of the chest was performed using the standard protocol during bolus administration of intravenous contrast. Multiplanar CT image reconstructions and MIPs were obtained to evaluate the vascular anatomy. RADIATION DOSE REDUCTION: This exam was performed according to the departmental dose-optimization program which includes automated exposure control, adjustment of the mA and/or kV according to patient size and/or use of iterative reconstruction technique. CONTRAST:  75mL OMNIPAQUE  IOHEXOL  350 MG/ML SOLN COMPARISON:  CTA chest 08/13/2023, chest CT without contrast 09/25/2022 FINDINGS: Cardiovascular: There is a right chest port with IJ approach catheter terminating about the superior cavoatrial junction. There is cardiomegaly with a right chamber predominance and RV/LV ratio of 1.3. There is contrast reflux into the IVC and hepatic veins which may be seen with right heart dysfunction or tricuspid regurgitation. There is a small pericardial effusion  anteriorly to the right. Enlarged pulmonary trunk again measuring 3.7 cm indicating arterial hypertension. Respiratory motion, especially in the bases, largely obscures the subsegmental pulmonary arteries. No arterial embolus is seen at least to the segmental level. Pulmonary veins are nondistended. The aorta and great vessels are unremarkable. Mediastinum/Nodes: Stable mildly prominent right hilar lymph nodes, largest 1.3 cm in short axis on 5:61. Stable subcarinal lymph nodes up to 1.4 cm. Substernal thymus appears similar. The esophagus is patulous but not thickened. There are borderline prominent bilateral axillary space lymph nodes, also unchanged. The trachea and central airways are clear.  Negative thyroid gland. Lungs/Pleura: Extensive multifocal scarring change and architectural distortion both lungs with underlying chronic ground-glass disease, with the ground-glass opacities most apparent and confluent in the lower lung fields. No new or grossly active infiltrate is seen. There is diffuse bronchial thickening. No new opacity is seen or focal nodules. No pleural effusion. No pneumothorax. Upper Abdomen: Autoinfarcted calcified and shrunken spleen. No acute abnormality. Musculoskeletal: Dense bones consistent with sickle cell disease. Mild thoracic dextroscoliosis. No acute or other significant osseous findings or chest wall abnormality. Review of the MIP images confirms the above findings. IMPRESSION: 1. No evidence of arterial embolus at least to the segmental level. Respiratory motion largely obscures the subsegmental pulmonary arteries. 2. Cardiomegaly with right chamber predominance and contrast reflux into the IVC and hepatic veins, the latter which may be seen with right heart dysfunction or tricuspid regurgitation. 3. Enlarged pulmonary trunk indicating arterial hypertension. 4. Extensive multifocal scarring change and architectural distortion both lungs with underlying chronic ground-glass disease.  No new or grossly active infiltrate is seen. 5. Diffuse bronchial thickening. 6. Stable mildly prominent right hilar, subcarinal and axillary space lymph nodes. 7. Autoinfarcted calcified and shrunken spleen. 8. Dense bones consistent with sickle cell disease. Electronically Signed   By: Francis Quam M.D.   On: 10/12/2023 04:47    Microbiology: No results found for this or any previous visit (from the past 240 hours).   Labs: Basic Metabolic Panel: Recent Labs  Lab 10/15/23 0548 10/17/23 1025  NA 137 136  K 4.0 4.2  CL 102 106  CO2 26 25  GLUCOSE 108* 91  BUN 12 15  CREATININE 1.04 0.92  CALCIUM  8.8* 8.3*   Liver Function Tests: Recent Labs  Lab  10/15/23 0548 10/17/23 1025  AST 23 32  ALT 13 12  ALKPHOS 55 56  BILITOT 5.4* 4.5*  PROT 7.9 7.7  ALBUMIN 3.6 3.6   No results for input(s): LIPASE, AMYLASE in the last 168 hours. No results for input(s): AMMONIA in the last 168 hours. CBC: Recent Labs  Lab 10/13/23 0449 10/14/23 0519 10/15/23 0548 10/17/23 1025  WBC 9.3 8.0 9.8 9.7  NEUTROABS  --   --   --  3.4  HGB 7.4* 7.6* 7.8* 7.5*  HCT 20.7* 21.3* 20.9* 20.3*  MCV 108.9* 108.1* 105.0* 105.7*  PLT 341 342 381 460*   Cardiac Enzymes: No results for input(s): CKTOTAL, CKMB, CKMBINDEX, TROPONINI in the last 168 hours. BNP: Invalid input(s): POCBNP CBG: No results for input(s): GLUCAP in the last 168 hours.  Time coordinating discharge: 50 minutes  Signed:  [name] Cover NP  [facility] 10/19/2023, 1:36 PM

## 2023-10-19 NOTE — Telephone Encounter (Signed)
 Copied from CRM [id number]. Topic: Clinical - Medication Refill >> Oct 19, 2023 10:55 AM Sophia H wrote: Medication:  oxyCODONE -acetaminophen  (PERCOCET) 10-325 MG tablet   Has the patient contacted their pharmacy? No, has to contact the office for fills.   This is the patient's preferred pharmacy:  [facility] [address] [facility] [place] [address] Phone: [phone] Fax: [phone]   Is this the correct pharmacy for this prescription? Yes If no, delete pharmacy and type the correct one.   Has the prescription been filled recently? Yes  Is the patient out of the medication? Yes  Has the patient been seen for an appointment in the last year OR does the patient have an upcoming appointment? Yes  Can we respond through MyChart? Yes  Agent: Please be advised that Rx refills may take up to 3 business days. We ask that you follow-up with your pharmacy.

## 2023-10-20 ENCOUNTER — Telehealth: Payer: Self-pay | Admitting: *Deleted

## 2023-10-20 NOTE — Transitions of Care (Post Inpatient/ED Visit) (Signed)
 10/20/2023  Name: Martin [name] MRN: [id number] DOB: 1990/05/02  Today's TOC FU Call Status: Today's TOC FU Call Status:: Successful TOC FU Call Completed TOC FU Call Complete Date: 10/20/23 Patient's Name and Date of Birth confirmed.  Transition Care Management Follow-up Telephone Call Date of Discharge: 10/19/23 Discharge Facility: [facility]) Type of Discharge: Inpatient Admission Primary Inpatient Discharge Diagnosis:: Sickle cell anemia with pain How have you been since you were released from the hospital?: Better (states  feel much better  appetite good, no issues with bowel/ bladder, ambulating without difficulty) Any questions or concerns?: No  Items Reviewed: Did you receive and understand the discharge instructions provided?: Yes Medications obtained,verified, and reconciled?: Yes (Medications Reviewed) Any new allergies since your discharge?: No Dietary orders reviewed?: Yes Type of Diet Ordered:: heart healthy Do you have support at home?: Yes Name of Support/Comfort Primary Source: pt did not share name of support person Reviewed importance of staying well hydrated, avoiding extremes of heat and cold, keep stress to a minimum  Medications Reviewed Today: Medications Reviewed Today     Reviewed by Aura Mliss LABOR, RN (Registered Nurse) on 10/20/23 at 1400  Med List Status: <None>   Medication Order Taking? Sig Documenting Provider Last Dose Status Informant  abacavir -dolutegravir -lamiVUDine  (TRIUMEQ ) 600-50-300 MG tablet [id number] Yes Take 1 tablet by mouth daily. Pearlean Manus, MD  Active Self  albuterol  (VENTOLIN  HFA) 108 (90 Base) MCG/ACT inhaler [id number]  Inhale 2 puffs into the lungs every 6 (six) hours as needed for wheezing or shortness of breath.  Patient not taking: Reported on 10/20/2023   Pearlean Manus, MD  Active Self  apixaban  (ELIQUIS ) 5 MG TABS tablet [id number] Yes Take 1 tablet (5 mg total) by mouth 2 (two) times daily. Pearlean Manus, MD  Active Self  cyclobenzaprine  (FLEXERIL ) 10 MG tablet [id number] Yes Take 10 mg by mouth 2 (two) times daily as needed for muscle spasms. [provider]  Active Self  Deferiprone  (FERRIPROX ) 500 MG TABS [id number] Yes Take 1 tablet by mouth daily.  Patient taking differently: Take 1 tablet by mouth daily.   Pearlean Manus, MD  Active Self  diphenhydrAMINE  (BENADRYL ) 25 MG tablet [id number] Yes Take 1 tablet (25 mg total) by mouth every 4 (four) hours as needed for itching. Pearlean Manus, MD  Active Self  folic acid  (FOLVITE ) 400 MCG tablet [id number]  Take 1 tablet (400 mcg total) by mouth daily.  Patient not taking: Reported on 10/20/2023   Pearlean Manus, MD  Active Self  gabapentin  (NEURONTIN ) 300 MG capsule [id number] Yes Take 1 capsule (300 mg total) by mouth at bedtime as needed (nerve pain). Pearlean Manus, MD  Active Self  guaiFENesin  (MUCINEX ) 600 MG 12 hr tablet [id number]  Take 1 tablet (600 mg total) by mouth 2 (two) times daily.  Patient not taking: Reported on 10/20/2023   Pearlean Manus, MD  Active Self  hydroxyurea  (HYDREA ) 500 MG capsule [id number] Yes Take 4 capsules (2,000 mg total) by mouth at bedtime. Pearlean Manus, MD  Active Self  NARCAN  4 MG/0.1ML LIQD nasal spray kit [id number] Yes Place 1 spray into the nose once as needed (as directed). [provider]  Active Self           Med Note MARISA, NATHANEL SAILOR   Tue Aug 10, 2023  2:21 PM)    oxyCODONE -acetaminophen  (PERCOCET) 10-325 MG tablet [id number] Yes Take 1 tablet by mouth every 4 (four) hours as needed for pain. [name], [name], [name]  Active   pantoprazole  (PROTONIX ) 40 MG tablet [id number]  Take 1 tablet (40 mg total) by mouth daily as needed (acid reflux).  Patient not taking: Reported on 10/20/2023   Pearlean Manus, MD  Active Self  senna-docusate (SENOKOT-S) 8.6-50 MG tablet [id number]  Take 2 tablets by mouth at bedtime.  Patient not taking: Reported on 10/20/2023   Pearlean Manus,  MD  Active Self  triamcinolone  cream (KENALOG ) 0.1 % [id number] Yes Apply 1 Application topically 2 (two) times daily. Tilford Bertram HERO, [name]  Active Self           Med Note MARISA, KATHY N   Tue Aug 10, 2023  2:21 PM)              Home Care and Equipment/Supplies: Were Home Health Services Ordered?: No Any new equipment or medical supplies ordered?: No  Functional Questionnaire: Do you need assistance with bathing/showering or dressing?: No Do you need assistance with meal preparation?: No Do you need assistance with eating?: No Do you have difficulty maintaining continence: No Do you need assistance with getting out of bed/getting out of a chair/moving?: No Do you have difficulty managing or taking your medications?: No  Follow up appointments reviewed: PCP Follow-up appointment confirmed?: Yes Date of PCP follow-up appointment?: 11/03/23 Follow-up Provider: Folashade [name]  @ 120 pm [facility] Follow-up appointment confirmed?: NA Do you need transportation to your follow-up appointment?: No Do you understand care options if your condition(s) worsen?: Yes-patient verbalized understanding  SDOH Interventions Today    Flowsheet Row Most Recent Value  SDOH Interventions   Food Insecurity Interventions Intervention Not Indicated  Housing Interventions Intervention Not Indicated  Transportation Interventions Intervention Not Indicated  Utilities Interventions Intervention Not Indicated    [name] [facility], BSN RN Care Manager/ Transition of Care [place]/ [facility] [phone]

## 2023-10-26 ENCOUNTER — Encounter (HOSPITAL_COMMUNITY): Payer: Self-pay | Admitting: Emergency Medicine

## 2023-10-26 ENCOUNTER — Inpatient Hospital Stay (HOSPITAL_COMMUNITY)
Admission: EM | Admit: 2023-10-26 | Discharge: 2023-10-29 | DRG: 812 | Disposition: A | Discharge status: Home / Self Care | Payer: Self-pay | Attending: Internal Medicine | Admitting: Internal Medicine

## 2023-10-26 DIAGNOSIS — Z9981 Dependence on supplemental oxygen: Secondary | ICD-10-CM

## 2023-10-26 DIAGNOSIS — Z888 Allergy status to other drugs, medicaments and biological substances status: Secondary | ICD-10-CM

## 2023-10-26 DIAGNOSIS — Z832 Family history of diseases of the blood and blood-forming organs and certain disorders involving the immune mechanism: Secondary | ICD-10-CM

## 2023-10-26 DIAGNOSIS — J984 Other disorders of lung: Secondary | ICD-10-CM | POA: Diagnosis present

## 2023-10-26 DIAGNOSIS — Z21 Asymptomatic human immunodeficiency virus [HIV] infection status: Secondary | ICD-10-CM | POA: Diagnosis present

## 2023-10-26 DIAGNOSIS — Z79899 Other long term (current) drug therapy: Secondary | ICD-10-CM

## 2023-10-26 DIAGNOSIS — J9611 Chronic respiratory failure with hypoxia: Secondary | ICD-10-CM | POA: Diagnosis present

## 2023-10-26 DIAGNOSIS — Z86711 Personal history of pulmonary embolism: Secondary | ICD-10-CM | POA: Diagnosis present

## 2023-10-26 DIAGNOSIS — D5700 Hb-SS disease with crisis, unspecified: Principal | ICD-10-CM | POA: Diagnosis present

## 2023-10-26 DIAGNOSIS — D638 Anemia in other chronic diseases classified elsewhere: Secondary | ICD-10-CM | POA: Diagnosis present

## 2023-10-26 DIAGNOSIS — D571 Sickle-cell disease without crisis: Secondary | ICD-10-CM | POA: Diagnosis present

## 2023-10-26 DIAGNOSIS — G894 Chronic pain syndrome: Secondary | ICD-10-CM | POA: Diagnosis present

## 2023-10-26 DIAGNOSIS — D72829 Elevated white blood cell count, unspecified: Secondary | ICD-10-CM | POA: Diagnosis present

## 2023-10-26 DIAGNOSIS — F1120 Opioid dependence, uncomplicated: Secondary | ICD-10-CM | POA: Diagnosis present

## 2023-10-26 DIAGNOSIS — F419 Anxiety disorder, unspecified: Secondary | ICD-10-CM | POA: Diagnosis present

## 2023-10-26 DIAGNOSIS — Z7901 Long term (current) use of anticoagulants: Secondary | ICD-10-CM

## 2023-10-26 MED ORDER — HYDROMORPHONE HCL 1 MG/ML IJ SOLN: 1.0000 mg | Freq: Once | INTRAMUSCULAR | Status: DC

## 2023-10-26 MED ORDER — ONDANSETRON HCL 4 MG/2ML IJ SOLN: 4.0000 mg | INTRAMUSCULAR | Status: DC | PRN

## 2023-10-26 MED ORDER — HYDROMORPHONE HCL 1 MG/ML IJ SOLN
2.0000 mg | INTRAMUSCULAR | Status: AC
  Administered 2023-10-27: 2 mg via INTRAVENOUS
  Filled 2023-10-26: qty 2

## 2023-10-26 MED ORDER — HYDROMORPHONE HCL 1 MG/ML IJ SOLN
2.0000 mg | INTRAMUSCULAR | Status: DC
  Filled 2023-10-26: qty 2

## 2023-10-26 MED ORDER — DIPHENHYDRAMINE HCL 25 MG PO CAPS: 25.0000 mg | ORAL_CAPSULE | ORAL | Status: DC | PRN

## 2023-10-26 NOTE — ED Triage Notes (Incomplete)
 Pt arriving in sickle cell pain crisis. Pain primarily in chest and back. 10/10. Pt took all home meds at 9 with no relief.

## 2023-10-26 NOTE — ED Provider Notes (Signed)
 [place] [facility] DE[facility] AT [facility] Provider Note   CSN: [id number] Arrival date & time: 10/26/23  2249     Patient presents with: Sickle Cell Pain Crisis (Chest, back)   Martin [name] is a 33 y.o. male.  {Add pertinent medical, surgical, social history, OB history to YEP:67052} The history is provided by the patient.  Sickle Cell Pain Crisis Mcguire X Buresh is a 33 y.o. male who presents to the Emergency Department complaining of *** Sickle cell chest pain today On 3L. Not on oxygen  at arrival Throbbing in low back, burning in chest. No sob. No fever, cough, ap, n/v, leg swelling.  Not typical for pain crisis. Pain is only on left chest.   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 No pain on breathing. Does not miss offten - one a week.   Percocet 10 every day.      Prior to Admission medications   Medication Sig Start Date End Date Taking? Authorizing Provider  abacavir -dolutegravir -lamiVUDine  (TRIUMEQ ) 600-50-300 MG tablet Take 1 tablet by mouth daily. 08/13/23   Pearlean Manus, MD  albuterol  (VENTOLIN  HFA) 108 (90 Base) MCG/ACT inhaler Inhale 2 puffs into the lungs every 6 (six) hours as needed for wheezing or shortness of breath. Patient not taking: Reported on 10/20/2023 08/13/23   Pearlean Manus, MD  apixaban  (ELIQUIS ) 5 MG TABS tablet Take 1 tablet (5 mg total) by mouth 2 (two) times daily. 08/13/23   Pearlean Manus, MD  cyclobenzaprine  (FLEXERIL ) 10 MG tablet Take 10 mg by mouth 2 (two) times daily as needed for muscle spasms. 12/22/22   [provider]  Deferiprone  (FERRIPROX ) 500 MG TABS Take 1 tablet by mouth daily. Patient taking differently: Take 1 tablet by mouth daily. 08/13/23   Pearlean Manus, MD  diphenhydrAMINE  (BENADRYL ) 25 MG tablet Take 1 tablet (25 mg total) by mouth every 4 (four) hours as needed for itching. 08/13/23   Pearlean Manus, MD  folic acid  (FOLVITE ) 400 MCG tablet Take 1 tablet (400 mcg total) by mouth  daily. Patient not taking: Reported on 10/20/2023 08/13/23   Pearlean Manus, MD  gabapentin  (NEURONTIN ) 300 MG capsule Take 1 capsule (300 mg total) by mouth at bedtime as needed (nerve pain). 08/13/23   Pearlean Manus, MD  guaiFENesin  (MUCINEX ) 600 MG 12 hr tablet Take 1 tablet (600 mg total) by mouth 2 (two) times daily. Patient not taking: Reported on 10/20/2023 08/13/23 08/12/24  Pearlean Manus, MD  hydroxyurea  (HYDREA ) 500 MG capsule Take 4 capsules (2,000 mg total) by mouth at bedtime. 08/13/23   Pearlean Manus, MD  NARCAN  4 MG/0.1ML LIQD nasal spray kit Place 1 spray into the nose once as needed (as directed). 01/07/23   [provider]  oxyCODONE -acetaminophen  (PERCOCET) 10-325 MG tablet Take 1 tablet by mouth every 4 (four) hours as needed for pain. 10/19/23   [name], [name], [name]  pantoprazole  (PROTONIX ) 40 MG tablet Take 1 tablet (40 mg total) by mouth daily as needed (acid reflux). Patient not taking: Reported on 10/20/2023 08/13/23   Pearlean Manus, MD  senna-docusate (SENOKOT-S) 8.6-50 MG tablet Take 2 tablets by mouth at bedtime. Patient not taking: Reported on 10/20/2023 08/13/23   Pearlean Manus, MD  triamcinolone  cream (KENALOG ) 0.1 % Apply 1 Application topically 2 (two) times daily. 01/12/23   Tilford Bertram HERO, [name]    Allergies: Ketorolac  tromethamine , Tape, and Wound dressing adhesive    Review of Systems  All other systems reviewed and are negative.   Updated  Vital Signs BP 104/65   Pulse 88   Temp 98.5 F (36.9 C)   Resp 20   SpO2 (!) 86%   Physical Exam Vitals and nursing note reviewed.  Constitutional:      Appearance: He is well-developed.  HENT:     Head: Normocephalic and atraumatic.  Cardiovascular:     Rate and Rhythm: Normal rate and regular rhythm.     Heart sounds: No murmur heard. Pulmonary:     Effort: Pulmonary effort is normal. No respiratory distress.     Breath sounds: Normal breath sounds.  Abdominal:     Palpations: Abdomen is  soft.     Tenderness: There is no abdominal tenderness. There is no guarding or rebound.  Musculoskeletal:        General: No swelling or tenderness.  Skin:    General: Skin is warm and dry.  Neurological:     Mental Status: He is alert and oriented to person, place, and time.  Psychiatric:        Behavior: Behavior normal.     (all labs ordered are listed, but only abnormal results are displayed) Labs Reviewed  COMPREHENSIVE METABOLIC PANEL WITH GFR  CBC WITH DIFFERENTIAL/PLATELET  RETICULOCYTES    EKG: None  Radiology: No results found.  {Document cardiac monitor, telemetry assessment procedure when appropriate:32947} Procedures   Medications Ordered in the ED  HYDROmorphone  (DILAUDID ) injection 1 mg (has no administration in time range)      {Click here for ABCD2, HEART and other calculators REFRESH Note before signing:1}                              Medical Decision Making Amount and/or Complexity of Data Reviewed Labs: ordered.  Risk Prescription drug management.   ***  {Document critical care time when appropriate  Document review of labs and clinical decision tools ie CHADS2VASC2, etc  Document your independent review of radiology images and any outside records  Document your discussion with family members, caretakers and with consultants  Document social determinants of health affecting pt's care  Document your decision making why or why not admission, treatments were needed:32947:::1}   Final diagnoses:  None    ED Discharge Orders     None

## 2023-10-27 ENCOUNTER — Other Ambulatory Visit: Payer: Self-pay

## 2023-10-27 ENCOUNTER — Emergency Department (HOSPITAL_COMMUNITY): Payer: Self-pay

## 2023-10-27 ENCOUNTER — Encounter (HOSPITAL_COMMUNITY): Payer: Self-pay | Admitting: Internal Medicine

## 2023-10-27 DIAGNOSIS — Z21 Asymptomatic human immunodeficiency virus [HIV] infection status: Secondary | ICD-10-CM

## 2023-10-27 DIAGNOSIS — Z86711 Personal history of pulmonary embolism: Secondary | ICD-10-CM

## 2023-10-27 DIAGNOSIS — J9611 Chronic respiratory failure with hypoxia: Secondary | ICD-10-CM

## 2023-10-27 DIAGNOSIS — G894 Chronic pain syndrome: Secondary | ICD-10-CM

## 2023-10-27 DIAGNOSIS — D5700 Hb-SS disease with crisis, unspecified: Secondary | ICD-10-CM

## 2023-10-27 LAB — RESPIRATORY PANEL BY PCR

## 2023-10-27 LAB — CBC WITH DIFFERENTIAL/PLATELET
Abs Immature Granulocytes: 0.01 K/uL (ref 0.00–0.07)
Basophils Absolute: 0.1 K/uL (ref 0.0–0.1)
Basophils Relative: 1 %
Eosinophils Absolute: 0.1 K/uL (ref 0.0–0.5)
Eosinophils Relative: 1 %
HCT: 21.7 % — ABNORMAL LOW (ref 39.0–52.0)
Hemoglobin: 7.8 g/dL — ABNORMAL LOW (ref 13.0–17.0)
Immature Granulocytes: 0 %
Lymphocytes Relative: 61 %
Lymphs Abs: 6.1 K/uL — ABNORMAL HIGH (ref 0.7–4.0)
MCH: 40 pg — ABNORMAL HIGH (ref 26.0–34.0)
MCHC: 35.9 g/dL (ref 30.0–36.0)
MCV: 111.3 fL — ABNORMAL HIGH (ref 80.0–100.0)
Monocytes Absolute: 0.8 K/uL (ref 0.1–1.0)
Monocytes Relative: 8 %
Neutro Abs: 2.9 K/uL (ref 1.7–7.7)
Neutrophils Relative %: 29 %
Platelets: 366 K/uL (ref 150–400)
RBC: 1.95 MIL/uL — ABNORMAL LOW (ref 4.22–5.81)
RDW: 24.8 % — ABNORMAL HIGH (ref 11.5–15.5)
WBC: 10 K/uL (ref 4.0–10.5)
nRBC: 2.9 % — ABNORMAL HIGH (ref 0.0–0.2)

## 2023-10-27 LAB — COMPREHENSIVE METABOLIC PANEL WITH GFR
ALT: 14 U/L (ref 0–44)
AST: 23 U/L (ref 15–41)
Albumin: 3.8 g/dL (ref 3.5–5.0)
Alkaline Phosphatase: 56 U/L (ref 38–126)
Anion gap: 8 (ref 5–15)
BUN: 12 mg/dL (ref 6–20)
CO2: 23 mmol/L (ref 22–32)
Calcium: 8.5 mg/dL — ABNORMAL LOW (ref 8.9–10.3)
Chloride: 110 mmol/L (ref 98–111)
Creatinine, Ser: 0.78 mg/dL (ref 0.61–1.24)
GFR, Estimated: >60 mL/min (ref >60)
Glucose, Bld: 97 mg/dL (ref 70–99)
Potassium: 4 mmol/L (ref 3.5–5.1)
Sodium: 141 mmol/L (ref 135–145)
Total Bilirubin: 6.4 mg/dL — ABNORMAL HIGH (ref 0.0–1.2)
Total Protein: 7.9 g/dL (ref 6.5–8.1)

## 2023-10-27 LAB — RETICULOCYTES
Immature Retic Fract: 36.9 % — ABNORMAL HIGH (ref 2.3–15.9)
RBC.: 1.9 MIL/uL — ABNORMAL LOW (ref 4.22–5.81)
Retic Count, Absolute: 499.5 K/uL — ABNORMAL HIGH (ref 19.0–186.0)
Retic Ct Pct: 26.3 % — ABNORMAL HIGH (ref 0.4–3.1)

## 2023-10-27 LAB — TROPONIN I (HIGH SENSITIVITY)
Troponin I (High Sensitivity): 6 ng/L (ref <18)
Troponin I (High Sensitivity): 6 ng/L (ref <18)

## 2023-10-27 MED ORDER — HYDROMORPHONE HCL 1 MG/ML IJ SOLN: 1.0000 mg | INTRAMUSCULAR | Status: DC | PRN

## 2023-10-27 MED ORDER — OXYCODONE-ACETAMINOPHEN 10-325 MG PO TABS: 1.0000 | ORAL_TABLET | ORAL | Status: DC | PRN

## 2023-10-27 MED ORDER — ACETAMINOPHEN 325 MG PO TABS: 650.0000 mg | ORAL_TABLET | Freq: Four times a day (QID) | ORAL | Status: DC | PRN

## 2023-10-27 MED ORDER — GABAPENTIN 300 MG PO CAPS: 300.0000 mg | ORAL_CAPSULE | Freq: Every evening | ORAL | Status: DC | PRN

## 2023-10-27 MED ORDER — SODIUM CHLORIDE 0.9% FLUSH: 10.0000 mL | INTRAVENOUS | Status: DC | PRN

## 2023-10-27 MED ORDER — HYDROMORPHONE HCL 1 MG/ML IJ SOLN
2.0000 mg | INTRAMUSCULAR | Status: AC | PRN
  Administered 2023-10-27 (×3): 2 mg via INTRAVENOUS
  Filled 2023-10-27 (×3): qty 2

## 2023-10-27 MED ORDER — CHLORHEXIDINE GLUCONATE CLOTH 2 % EX PADS
6.0000 | MEDICATED_PAD | Freq: Every day | CUTANEOUS | Status: DC
  Administered 2023-10-27 – 2023-10-29 (×3): 6 via TOPICAL

## 2023-10-27 MED ORDER — HYDROMORPHONE HCL 1 MG/ML IJ SOLN: 2.0000 mg | INTRAMUSCULAR | Status: DC | PRN

## 2023-10-27 MED ORDER — ONDANSETRON HCL 4 MG/2ML IJ SOLN: 4.0000 mg | INTRAMUSCULAR | Status: AC | PRN

## 2023-10-27 MED ORDER — DIPHENHYDRAMINE HCL 50 MG/ML IJ SOLN: 50.0000 mg | Freq: Four times a day (QID) | INTRAMUSCULAR | Status: DC | PRN

## 2023-10-27 MED ORDER — FOLIC ACID 1 MG PO TABS
500.0000 ug | ORAL_TABLET | Freq: Every day | ORAL | Status: DC
  Administered 2023-10-27 – 2023-10-29 (×3): 0.5 mg via ORAL
  Filled 2023-10-27 (×3): qty 1

## 2023-10-27 MED ORDER — SODIUM CHLORIDE 0.9% FLUSH
10.0000 mL | Freq: Two times a day (BID) | INTRAVENOUS | Status: DC
  Administered 2023-10-27: 10 mL

## 2023-10-27 MED ORDER — HYDROMORPHONE HCL 1 MG/ML IJ SOLN
2.0000 mg | INTRAMUSCULAR | Status: AC
  Administered 2023-10-27: 2 mg via INTRAVENOUS
  Filled 2023-10-27: qty 2

## 2023-10-27 MED ORDER — HYDROMORPHONE 1 MG/ML IV SOLN
INTRAVENOUS | Status: DC
  Administered 2023-10-27: 2.1 mg via INTRAVENOUS
  Administered 2023-10-27: 30 mg via INTRAVENOUS
  Filled 2023-10-27: qty 30

## 2023-10-27 MED ORDER — SODIUM CHLORIDE 0.9% FLUSH
3.0000 mL | Freq: Two times a day (BID) | INTRAVENOUS | Status: DC
  Administered 2023-10-27: 3 mL via INTRAVENOUS

## 2023-10-27 MED ORDER — NALOXONE HCL 0.4 MG/ML IJ SOLN
0.4000 mg | INTRAMUSCULAR | Status: DC | PRN
Start: 2023-10-27 — End: 2023-10-29

## 2023-10-27 MED ORDER — ACETAMINOPHEN 650 MG RE SUPP: 650.0000 mg | Freq: Four times a day (QID) | RECTAL | Status: DC | PRN

## 2023-10-27 MED ORDER — HYDROMORPHONE HCL 1 MG/ML IJ SOLN
2.0000 mg | Freq: Once | INTRAMUSCULAR | Status: AC
  Administered 2023-10-27: 2 mg via INTRAVENOUS
  Filled 2023-10-27: qty 2

## 2023-10-27 MED ORDER — ABACAVIR-DOLUTEGRAVIR-LAMIVUD 600-50-300 MG PO TABS
1.0000 | ORAL_TABLET | Freq: Every day | ORAL | Status: DC
  Administered 2023-10-27 – 2023-10-29 (×3): 1 via ORAL
  Filled 2023-10-27 (×4): qty 1

## 2023-10-27 MED ORDER — OXYCODONE HCL 5 MG PO TABS: 5.0000 mg | ORAL_TABLET | ORAL | Status: DC | PRN

## 2023-10-27 MED ORDER — APIXABAN 5 MG PO TABS
5.0000 mg | ORAL_TABLET | Freq: Two times a day (BID) | ORAL | Status: DC
  Administered 2023-10-27 – 2023-10-29 (×5): 5 mg via ORAL
  Filled 2023-10-27 (×3): qty 1
  Filled 2023-10-27: qty 2
  Filled 2023-10-27: qty 1

## 2023-10-27 MED ORDER — ALBUTEROL SULFATE (2.5 MG/3ML) 0.083% IN NEBU: 3.0000 mL | INHALATION_SOLUTION | Freq: Four times a day (QID) | RESPIRATORY_TRACT | Status: DC | PRN

## 2023-10-27 MED ORDER — SODIUM CHLORIDE 0.45 % IV SOLN: INTRAVENOUS | Status: AC

## 2023-10-27 MED ORDER — DIPHENHYDRAMINE HCL 50 MG/ML IJ SOLN
25.0000 mg | Freq: Four times a day (QID) | INTRAMUSCULAR | Status: DC | PRN
  Administered 2023-10-27 (×2): 25 mg via INTRAVENOUS
  Filled 2023-10-27 (×2): qty 1

## 2023-10-27 MED ORDER — DIPHENHYDRAMINE HCL 25 MG PO CAPS
25.0000 mg | ORAL_CAPSULE | ORAL | Status: DC | PRN
  Administered 2023-10-27: 25 mg via ORAL
  Filled 2023-10-27 (×2): qty 1

## 2023-10-27 MED ORDER — OXYCODONE-ACETAMINOPHEN 5-325 MG PO TABS: 1.0000 | ORAL_TABLET | ORAL | Status: DC | PRN

## 2023-10-27 MED ORDER — SODIUM CHLORIDE 0.9% FLUSH: 9.0000 mL | INTRAVENOUS | Status: DC | PRN

## 2023-10-27 MED ORDER — DEFERIPRONE 500 MG PO TABS: 500.0000 mg | ORAL_TABLET | Freq: Every day | ORAL | Status: DC

## 2023-10-27 MED ORDER — CYCLOBENZAPRINE HCL 10 MG PO TABS
10.0000 mg | ORAL_TABLET | Freq: Two times a day (BID) | ORAL | Status: DC | PRN
  Administered 2023-10-27 – 2023-10-29 (×2): 10 mg via ORAL
  Filled 2023-10-27 (×2): qty 1

## 2023-10-27 MED ORDER — SODIUM CHLORIDE 0.9 % IV SOLN: 250.0000 mL | INTRAVENOUS | Status: DC | PRN

## 2023-10-27 MED ORDER — SODIUM CHLORIDE 0.9% FLUSH: 3.0000 mL | INTRAVENOUS | Status: DC | PRN

## 2023-10-27 MED ORDER — HYDROMORPHONE 1 MG/ML IV SOLN
INTRAVENOUS | Status: DC
  Administered 2023-10-28: 3 mg via INTRAVENOUS
  Administered 2023-10-28: 30 mg via INTRAVENOUS
  Administered 2023-10-28: 3 mg via INTRAVENOUS
  Filled 2023-10-27: qty 30

## 2023-10-27 MED ORDER — OXYCODONE-ACETAMINOPHEN 5-325 MG PO TABS
1.0000 | ORAL_TABLET | ORAL | Status: DC | PRN
  Administered 2023-10-27 (×2): 2 via ORAL
  Filled 2023-10-27 (×2): qty 2

## 2023-10-27 NOTE — Progress Notes (Signed)
 Patient presents to the emergency department with sickle cell pain.  He is hemodynamically stable at this  time.  Patient is admitted for ongoing sickle cell pain management.  Continue current treatment regimen.

## 2023-10-27 NOTE — H&P (Signed)
 History and Physical    Martin [name] FMW:[id number] DOB: 1991-03-31 DOA: 10/26/2023  PCP: [name], [name], [name]   Patient coming from: Home   Chief Complaint:  Chief Complaint  Patient presents with   Sickle Cell Pain Crisis    Chest, back   ED TRIAGE note:  HPI:  Martin [name] is a 33 y.o. male with medical history significant of sickle cell disease, chronic hypoxic respiratory failure 3 L oxygen  at baseline, chronic pain syndrome, chronic hyperbilirubinemia, chronic opioid dependence, history of HIV and history of PE on Xarelto presented to emergency department with complaining of chest pain and back pain concern for sickle cell pain flare.  During my evaluation at the bedside patient is complaining about he is hurting all over.  Denies any shortness of breath, palpitation, cough, nausea, vomiting and fever and chills.  Patient reported that at home he takes Percocet as needed for pain control however it is not giving him much relief.  ED Course:  At presentation to ED O2 sat dropped to 86% room air currently maintaining 92% on 2 L oxygen . Flat and normal troponin without any delta change. CBC showing normal WBC count, hemoglobin 7.8, hematocrit 21 (hemoglobin at baseline), elevated MCV and normal platelet count. Elevated reticulocyte and absolute reticulocyte leukocyte count. CMP unremarkable except chronically elevated bilirubin 6.4.  EKG showed normal sinus rhythm heart rate 84, left ventricular hypertrophy pattern and abnormal T waves in anterolateral lead.  Per chart review EKG finding on unchanged as compared to previous EKG from 6/24.  Chest x-ray no active disease process.  Chronic changes include heart inflation and scaring.  In the ED patient received Dilaudid  multiple doses, Zofran .  Hospitalist has been consulted for further evaluation management of sickle cell pain crisis.  Significant labs in the ED: Lab Orders         Respiratory (~20  pathogens) panel by PCR         Comprehensive metabolic panel         CBC with Differential         Reticulocytes       Review of Systems:  Review of Systems  Constitutional:  Negative for chills, fever, malaise/fatigue and weight loss.  Respiratory:  Positive for shortness of breath. Negative for cough, hemoptysis, sputum production and wheezing.   Cardiovascular:  Positive for chest pain. Negative for palpitations, orthopnea, claudication and leg swelling.  Gastrointestinal:  Positive for abdominal pain. Negative for blood in stool, constipation, diarrhea, heartburn, melena, nausea and vomiting.  Genitourinary:  Negative for dysuria, frequency, hematuria and urgency.  Musculoskeletal:  Positive for back pain. Negative for falls, joint pain, myalgias and neck pain.  Neurological:  Negative for dizziness and headaches.  Psychiatric/Behavioral:  The patient is not nervous/anxious.     Past Medical History:  Diagnosis Date   Anxiety    HIV (human immunodeficiency virus infection) (HCC)    Proteinuria    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HCC)    Vitamin D  deficiency 10/2018    Past Surgical History:  Procedure Laterality Date   IR IMAGING GUIDED PORT INSERTION  08/29/2019   IR REMOVAL TUN ACCESS W/ PORT W/O FL MOD SED  08/30/2020   TEE WITHOUT CARDIOVERSION N/A 08/30/2020   Procedure: TRANSESOPHAGEAL ECHOCARDIOGRAM (TEE);  Surgeon: Mona Vinie BROCKS, MD;  Location: [facility] ENDOSCOPY;  Service: Cardiovascular;  Laterality: N/A;     reports that he has never smoked. He has never used smokeless tobacco. He reports current drug use. Drug: Marijuana.  He reports that he does not drink alcohol .  Allergies  Allergen Reactions   Ketorolac  Tromethamine  Swelling and Other (See Comments)    Patient reports facial edema and left arm edema after administration.   Tape Rash and Other (See Comments)    PLEASE DO NOT USE THE CLEAR, THICK, PLASTIC TAPE- only paper tape is tolerated    Wound Dressing Adhesive Rash     Family History  Problem Relation Age of Onset   Sickle cell trait Mother    Sickle cell trait Father    Birth defects Maternal Grandmother    Birth defects Paternal Grandmother     Prior to Admission medications   Medication Sig Start Date End Date Taking? Authorizing Provider  abacavir -dolutegravir -lamiVUDine  (TRIUMEQ ) 600-50-300 MG tablet Take 1 tablet by mouth daily. 08/13/23   Pearlean Manus, MD  albuterol  (VENTOLIN  HFA) 108 (90 Base) MCG/ACT inhaler Inhale 2 puffs into the lungs every 6 (six) hours as needed for wheezing or shortness of breath. Patient not taking: Reported on 10/20/2023 08/13/23   Pearlean Manus, MD  apixaban  (ELIQUIS ) 5 MG TABS tablet Take 1 tablet (5 mg total) by mouth 2 (two) times daily. 08/13/23   Pearlean Manus, MD  cyclobenzaprine  (FLEXERIL ) 10 MG tablet Take 10 mg by mouth 2 (two) times daily as needed for muscle spasms. 12/22/22   [provider]  Deferiprone  (FERRIPROX ) 500 MG TABS Take 1 tablet by mouth daily. Patient taking differently: Take 1 tablet by mouth daily. 08/13/23   Pearlean Manus, MD  diphenhydrAMINE  (BENADRYL ) 25 MG tablet Take 1 tablet (25 mg total) by mouth every 4 (four) hours as needed for itching. 08/13/23   Pearlean Manus, MD  folic acid  (FOLVITE ) 400 MCG tablet Take 1 tablet (400 mcg total) by mouth daily. Patient not taking: Reported on 10/20/2023 08/13/23   Pearlean Manus, MD  gabapentin  (NEURONTIN ) 300 MG capsule Take 1 capsule (300 mg total) by mouth at bedtime as needed (nerve pain). 08/13/23   Pearlean Manus, MD  guaiFENesin  (MUCINEX ) 600 MG 12 hr tablet Take 1 tablet (600 mg total) by mouth 2 (two) times daily. Patient not taking: Reported on 10/20/2023 08/13/23 08/12/24  Pearlean Manus, MD  hydroxyurea  (HYDREA ) 500 MG capsule Take 4 capsules (2,000 mg total) by mouth at bedtime. 08/13/23   Pearlean Manus, MD  NARCAN  4 MG/0.1ML LIQD nasal spray kit Place 1 spray into the nose once as needed (as directed). 01/07/23    [provider]  oxyCODONE -acetaminophen  (PERCOCET) 10-325 MG tablet Take 1 tablet by mouth every 4 (four) hours as needed for pain. 10/19/23   [name], [name], [name]  pantoprazole  (PROTONIX ) 40 MG tablet Take 1 tablet (40 mg total) by mouth daily as needed (acid reflux). Patient not taking: Reported on 10/20/2023 08/13/23   Pearlean Manus, MD  senna-docusate (SENOKOT-S) 8.6-50 MG tablet Take 2 tablets by mouth at bedtime. Patient not taking: Reported on 10/20/2023 08/13/23   Pearlean Manus, MD  triamcinolone  cream (KENALOG ) 0.1 % Apply 1 Application topically 2 (two) times daily. 01/12/23   Tilford Bertram HERO, [name]     Physical Exam: Vitals:   10/26/23 2317 10/26/23 2345 10/27/23 0245 10/27/23 0345  BP: 104/65 102/74 101/76   Pulse:  79 83   Resp:  19 (!) 21   Temp:  98.7 F (37.1 C)  98.7 F (37.1 C)  SpO2:  97% 92%     Physical Exam Vitals and nursing note reviewed.  Constitutional:      Appearance: He is not  ill-appearing.  HENT:     Mouth/Throat:     Mouth: Mucous membranes are dry.  Eyes:     Pupils: Pupils are equal, round, and reactive to light.  Cardiovascular:     Rate and Rhythm: Normal rate and regular rhythm.     Pulses: Normal pulses.     Heart sounds: Normal heart sounds.  Pulmonary:     Effort: Pulmonary effort is normal.     Breath sounds: Normal breath sounds.  Abdominal:     Palpations: Abdomen is soft.  Musculoskeletal:     Cervical back: Neck supple.     Right lower leg: No edema.     Left lower leg: No edema.  Skin:    General: Skin is dry.     Capillary Refill: Capillary refill takes less than 2 seconds.  Neurological:     Mental Status: He is alert and oriented to person, place, and time.  Psychiatric:        Mood and Affect: Mood normal.      Labs on Admission: I have personally reviewed following labs and imaging studies  CBC: Recent Labs  Lab 10/26/23 2324  WBC 10.0  NEUTROABS 2.9  HGB 7.8*  HCT 21.7*  MCV 111.3*  PLT 366    Basic Metabolic Panel: Recent Labs  Lab 10/26/23 2324  NA 141  K 4.0  CL 110  CO2 23  GLUCOSE 97  BUN 12  CREATININE 0.78  CALCIUM  8.5*   GFR: CrCl cannot be calculated (Unknown ideal weight.). Liver Function Tests: Recent Labs  Lab 10/26/23 2324  AST 23  ALT 14  ALKPHOS 56  BILITOT 6.4*  PROT 7.9  ALBUMIN 3.8   No results for input(s): LIPASE, AMYLASE in the last 168 hours. No results for input(s): AMMONIA in the last 168 hours. Coagulation Profile: No results for input(s): INR, PROTIME in the last 168 hours. Cardiac Enzymes: Recent Labs  Lab 10/26/23 2324 10/27/23 0221  TROPONINIHS 6 6   BNP (last 3 results) No results for input(s): BNP in the last 8760 hours. HbA1C: No results for input(s): HGBA1C in the last 72 hours. CBG: No results for input(s): GLUCAP in the last 168 hours. Lipid Profile: No results for input(s): CHOL, HDL, LDLCALC, [name], CHOLHDL, LDLDIRECT in the last 72 hours. Thyroid Function Tests: No results for input(s): TSH, T4TOTAL, FREET4, T3FREE, THYROIDAB in the last 72 hours. Anemia Panel: Recent Labs    10/26/23 2324  RETICCTPCT 26.3*   Urine analysis:    Component Value Date/Time   COLORURINE AMBER (A) 06/20/2023 0311   APPEARANCEUR CLOUDY (A) 06/20/2023 0311   LABSPEC 1.014 06/20/2023 0311   PHURINE 5.0 06/20/2023 0311   GLUCOSEU NEGATIVE 06/20/2023 0311   HGBUR MODERATE (A) 06/20/2023 0311   BILIRUBINUR NEGATIVE 06/20/2023 0311   BILIRUBINUR small (A) 03/18/2021 1218   BILIRUBINUR neg 11/27/2019 1149   KETONESUR NEGATIVE 06/20/2023 0311   PROTEINUR 100 (A) 06/20/2023 0311   UROBILINOGEN 2.0 (A) 03/18/2021 1218   UROBILINOGEN 4.0 (H) 05/21/2009 1944   NITRITE NEGATIVE 06/20/2023 0311   LEUKOCYTESUR NEGATIVE 06/20/2023 0311    Radiological Exams on Admission: I have personally reviewed images DG Chest Port 1 View Result Date: 10/27/2023 CLINICAL DATA:  Left-sided chest pain. EXAM:  PORTABLE CHEST 1 VIEW COMPARISON:  PA Lat chest and chest CT no contrast both 10/15/2023 FINDINGS: Right chest port with IJ approach catheter again terminates in the distal SVC. Hyperinflation and scattered scarring changes of the lung fields are again  noted with stable areas of architectural distortion, and chronic ground-glass disease better visualized on CT. No active lung infiltrate is seen, no substantial pleural effusion. The cardiomediastinal silhouette and vascular markings are normal. No edema is seen. Thoracic cage is intact. IMPRESSION: No evidence of acute chest disease. Chronic changes including overinflation and scarring. Electronically Signed   By: Francis Quam M.D.   On: 10/27/2023 00:23     EKG: My personal interpretation of EKG shows: Sinus rhythm heart rate 84.  Nonspecific T wave abnormality which is present as compared to previous EKG.  Since June    Assessment/Plan: Principal Problem:   Sickle cell pain crisis (HCC) Active Problems:   History of pulmonary embolism   Chronic pain syndrome   History of HIV infection (HCC)   Chronic hypoxic respiratory failure (HCC)    Assessment and Plan: Sickle cell pain crisis Chronic macrocytic anemia Chronic pain syndrome Chronic opioid tolerance and dependence -Presented emergency department complaining of chest pain and back pain and pain is not improving with home oral pain medications.  Patient denies any fever, chill, cough, abdominal pain nausea and vomiting. - At presentation to ED patient is hemodynamically stable except O2 sat dropped 86% room air.  At baseline patient is treated oxygen  and once nasal cannula oxygen  has been initiated O2 sat removed above 97%. - CBC showing stable H&H 7.8 and 21.  Chronically elevated MCV 111.3.  Elevated reticulocyte and absolute reticulocyte count.  CMP unremarkable except chronically elevated bilirubin 6.4. - Chest x-ray no acute disease process.  It showed chronic changes including  overinflation and scarring. - At this time there is no concern for acute chest syndrome - In the ED patient received Dilaudid  multiple doses which improved the pain. - Starting Dilaudid  PCA.  Continue gabapentin  300 mg daily.  Continue Percocet 5-10 mg every 4 hrs as needed for moderate to severe pain control.   Avoiding Toradol  given history of rash and facial swelling with Toradol  in the past. -Continue folic acid .  Holding hydroxyurea  given hemoglobin is below 8. - Starting 0.45% NS 150 cc/h. -Continue monitor improvement of pain. -At home patient only takes Percocet as needed which is not giving him much relief of the pain control.  Patient will benefit from extended release morphine  coverage in order for the pain control and avoid recurrent hospital admission for sickle cell pain crisis. -Consulted sickle cell team.   History of HIV -Continue Triumeq  once daily.  History of pulmonary embolism - Continue Eliquis  5 mg twice daily  Chronic hypoxia respiratory failure 3 L oxygen  at baseline -In the ED at room air patient O2 sat dropped to 86% however immediately improved after placing the nasal cannula oxygen . - Checking respiratory panel. -Chest x-ray showed overinflation and scarring. -Chronic hypoxic respiratory failure in the setting of pulmonary scarring. -Continue supplemental oxygen . - Continue albuterol  as needed.   DVT prophylaxis:  Eliquis  Code Status:  Full Code Diet: Regular diet Disposition Plan:   Continue monitor improvement of pain Consults: Sickle cell team Admission status:   Observation, Step Down Unit  Severity of Illness: The appropriate patient status for this patient is OBSERVATION. Observation status is judged to be reasonable and necessary in order to provide the required intensity of service to ensure the patient's safety. The patient's presenting symptoms, physical exam findings, and initial radiographic and laboratory data in the context of their  medical condition is felt to place them at decreased risk for further clinical deterioration. Furthermore, it is anticipated that  the patient will be medically stable for discharge from the hospital within 2 midnights of admission.     Gerri Acre, MD [facility]  How to contact the [facility] Attending or Consulting provider 7A - 7P or covering provider during after hours 7P -7A, for this patient.  Check the care team in [facility] and look for a) attending/consulting [facility] provider listed and b) the [facility] team listed Log into [email or web address] and use [facility]'s universal password to access. If you do not have the password, please contact the hospital operator. Locate the [facility] provider you are looking for under [facility] and page to a number that you can be directly reached. If you still have difficulty reaching the provider, please page the [facility] (Director on Call) for the Hospitalists listed on amion for assistance.  10/27/2023, 6:01 AM

## 2023-10-27 NOTE — ED Notes (Addendum)
 Report given Nate, RN

## 2023-10-28 ENCOUNTER — Other Ambulatory Visit (HOSPITAL_COMMUNITY): Payer: Self-pay

## 2023-10-28 DIAGNOSIS — D571 Sickle-cell disease without crisis: Secondary | ICD-10-CM | POA: Diagnosis present

## 2023-10-28 LAB — CBC
HCT: 21.9 % — ABNORMAL LOW (ref 39.0–52.0)
Hemoglobin: 7.9 g/dL — ABNORMAL LOW (ref 13.0–17.0)
MCH: 39.3 pg — ABNORMAL HIGH (ref 26.0–34.0)
MCHC: 36.1 g/dL — ABNORMAL HIGH (ref 30.0–36.0)
MCV: 109 fL — ABNORMAL HIGH (ref 80.0–100.0)
Platelets: 313 K/uL (ref 150–400)
RBC: 2.01 MIL/uL — ABNORMAL LOW (ref 4.22–5.81)
RDW: 23.8 % — ABNORMAL HIGH (ref 11.5–15.5)
WBC: 11.2 K/uL — ABNORMAL HIGH (ref 4.0–10.5)
nRBC: 19.3 % — ABNORMAL HIGH (ref 0.0–0.2)

## 2023-10-28 MED ORDER — CARMEX CLASSIC LIP BALM EX OINT
TOPICAL_OINTMENT | CUTANEOUS | Status: DC | PRN
  Filled 2023-10-28: qty 10

## 2023-10-28 MED ORDER — HYDROXYZINE HCL 25 MG PO TABS
25.0000 mg | ORAL_TABLET | Freq: Once | ORAL | Status: DC
Start: 2023-10-28 — End: 2023-10-29
  Filled 2023-10-28: qty 1

## 2023-10-28 MED ORDER — BISACODYL 10 MG RE SUPP
10.0000 mg | Freq: Every day | RECTAL | Status: DC | PRN
  Administered 2023-10-28: 10 mg via RECTAL
  Filled 2023-10-28: qty 1

## 2023-10-28 NOTE — Discharge Instructions (Addendum)
 [email or web address] Tel#[phone]

## 2023-10-28 NOTE — Plan of Care (Signed)
  Problem: Self-Care: Goal: Ability to incorporate actions that prevent/reduce pain crisis will improve Outcome: Progressing   Problem: Fluid Volume: Goal: Ability to maintain a balanced intake and output will improve Outcome: Progressing   Problem: Education: Goal: Knowledge of General Education information will improve Description: Including pain rating scale, medication(s)/side effects and non-pharmacologic comfort measures Outcome: Progressing

## 2023-10-28 NOTE — TOC Initial Note (Signed)
 Transition of Care [facility]) - Initial/Assessment Note    Patient Details  Name: Martin [name] MRN: [id number] Date of Birth: 09-Jun-1990  [facility] CM/SW Contact:    Bascom Service, RN Phone Number: 10/28/2023, 10:00 AM  Clinical Narrative:d/c plan home. Has PCP-appt set for f/u see d/c instructions-[facility]. Resource given for [employer] market place. On 02  has concentrator-will check who provides home 02. Has own transport home.                  Expected Discharge Plan: Home/Self Care Barriers to Discharge: Continued Medical Work up   Patient Goals and CMS Choice Patient states their goals for this hospitalization and ongoing recovery are:: Home CMS Medicare.gov Compare Post Acute Care list provided to:: Patient Choice offered to / list presented to : Patient [place] ownership interest in [facility].provided to:: Patient    Expected Discharge Plan and Services   Discharge Planning Services: CM Consult   Living arrangements for the past 2 months: Single Family Home                                      Prior Living Arrangements/Services Living arrangements for the past 2 months: Single Family Home Lives with:: Relatives                   Activities of Daily Living   ADL Screening (condition at time of admission) Independently performs ADLs?: Yes (appropriate for developmental age) Is the patient deaf or have difficulty hearing?: No Does the patient have difficulty seeing, even when wearing glasses/contacts?: No Does the patient have difficulty concentrating, remembering, or making decisions?: No  Permission Sought/Granted                  Emotional Assessment              Admission diagnosis:  Sickle cell pain crisis (HCC) [D57.00] Patient Active Problem List   Diagnosis Date Noted   Sickle cell crisis (HCC) 10/12/2023   Chronic hypoxic respiratory failure (HCC) 08/04/2023   Iron  overload, 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   Influenza A with pneumonia 06/20/2023   Acute hypoxemic respiratory failure (HCC) 06/20/2023   AKI (acute kidney injury) (HCC) 06/20/2023   Influenza A with respiratory manifestations 06/20/2023   Acute on chronic anemia 03/16/2023   History of HIV infection (HCC) 03/16/2023   Hypokalemia 03/16/2023   Anemia of chronic disease 07/13/2022   Hyperbilirubinemia 07/13/2022   Tinea capitis 07/29/2021   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HCC) 06/24/2021   Bacteremia due to Enterococcus 08/29/2020   Acute chest syndrome (HCC) 08/26/2020   Hypoxia 07/09/2020   COVID-19 05/13/2020   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/18/2020   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   Abnormal penile discharge, without blood 02/22/2020   Leukocytosis 01/02/2020   Anxiety 11/27/2019   Proteinuria 11/27/2019   Acute on chronic respiratory failure with hypoxia (HCC) 11/16/2019   Chronic, continuous use of opioids 08/15/2019   Seasonal allergies 08/15/2019   Chest congestion    Sickle cell anemia with crisis (HCC) 08/04/2019   Chronic pain syndrome 08/04/2019   History of pulmonary embolism 08/04/2019   Single subsegmental pulmonary embolism without acute cor pulmonale (HCC) 02/10/2019   Acute chest syndrome due to sickle cell crisis (HCC) 02/10/2019   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) 03/09/2018   Bone  pain 03/07/2018   Hip pain 03/07/2018   Acute bronchitis due to Streptococcus 02/21/2018   Heart murmur 02/03/2017   Sickle cell disease with crisis (HCC) 02/02/2017   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 [place] [address] Phone: [phone] Fax: [phone]  [id number] - [place], [place] - [address] AT [facility] [address] [place] [address] Phone: [phone] Fax: [phone]     Social Drivers of Health (SDOH) Social History: SDOH Screenings   Food Insecurity: No Food Insecurity (10/27/2023)  Recent Concern: Food Insecurity - Food Insecurity Present (08/03/2023)  Housing: Low Risk  (10/27/2023)  Transportation Needs: No Transportation Needs (10/27/2023)  Recent Concern: Transportation Needs - Unmet Transportation Needs (10/12/2023)  Utilities: Not At Risk (10/27/2023)  Recent Concern: Utilities - At Risk (10/12/2023)  Depression (PHQ2-9): Low Risk  (10/20/2023)  Recent Concern: Depression (PHQ2-9) - Medium Risk (08/04/2023)  Financial Resource Strain: Low Risk  (10/09/2023)   Received from [facility]  Recent Concern: Financial Resource Strain - Medium Risk (08/03/2023)  Physical Activity: Sufficiently Active (10/09/2023)   [facility] from [facility]  Social Connections: Socially Isolated (10/09/2023)   Received from [facility]  Stress: No Stress Concern Present (10/09/2023)   Received from [facility]  Tobacco Use: Low Risk  (10/27/2023)   SDOH Interventions:     Readmission Risk Interventions    10/13/2023    1:11 PM 08/12/2023    4:01 PM 03/16/2023    9:37 AM  Readmission Risk Prevention Plan  Transportation Screening Complete Complete Complete  Medication Review Oceanographer) Complete Complete Complete  PCP or Specialist appointment within 3-5 days of discharge Complete Complete Complete  HRI or Home Care Consult  Complete Complete  SW Recovery Care/Counseling Consult Complete Complete Complete  Palliative Care Screening Not Applicable Not Applicable Not Applicable  Skilled Nursing Facility  Not Applicable Not Applicable Not Applicable

## 2023-10-28 NOTE — Progress Notes (Signed)
 Patient ID: Martin [name], male   DOB: [birth year], 33 y.o.   MRN: [id number] Subjective: Martin [name]  is a 33 y.o. male with history of sickle cell disease,  chronic pain syndrome, opiate dependency and tolerance, history of HIV, and history of PE on Xarelto.  Patient presents to the emergency department with generalize body pain, chest pain and lower extremity pain. He is on 3l oxygen  at home.   Patient is reporting improved pain of 9/10 today shortness of breath improved with oxygen  via nasal cannula at 2 L.  Saturations at 92%. Denies cough, fever, diarrhea /vomiting/diarrhea.  Encouraged to ambulate and use incentive spirometry   Objective:  Vital signs in last 24 hours:  Vitals:   10/28/23 0402 10/28/23 0824 10/28/23 0839 10/28/23 0944  BP:  101/69    Pulse:  83    Resp: 14 20 12    Temp:  98 F (36.7 C)    TempSrc:  Oral    SpO2: 90% (!) 87% 92%   Weight:    64.4 kg  Height:    6' 3 (1.905 m)    Intake/Output from previous day:   Intake/Output Summary (Last 24 hours) at 10/28/2023 1145 Last data filed at 10/28/2023 0831 Gross per 24 hour  Intake 2179.48 ml  Output 1350 ml  Net 829.48 ml     Physical Exam: General: Alert, awake, oriented x3, in no acute distress.  HEENT: [place]/AT PEERL, EOMI Neck: Trachea midline,  no masses, no thyromegal,y no JVD, no carotid bruit OROPHARYNX:  Moist, No exudate/ erythema/lesions.  Heart: Regular rate and rhythm, without murmurs, rubs, gallops, PMI non-displaced, no heaves or thrills on palpation.  Lungs: Clear to auscultation, no wheezing or rhonchi noted. No increased vocal fremitus resonant to percussion  Abdomen: Soft, nontender, nondistended, positive bowel sounds, no masses no hepatosplenomegaly noted..  Neuro: No focal neurological deficits noted cranial nerves II through XII grossly intact. DTRs 2+ bilaterally upper and lower extremities. Strength 5 out of 5 in bilateral upper and lower extremities. Musculoskeletal:  Generalized body tenderness.  Psychiatric: Patient alert and oriented x3, good insight and cognition, good recent to remote recall. Lymph node survey: No cervical axillary or inguinal lymphadenopathy noted.  Lab Results:  Basic Metabolic Panel:    Component Value Date/Time   NA 141 10/26/2023 2324   NA 140 01/12/2023 1607   K 4.0 10/26/2023 2324   CL 110 10/26/2023 2324   CO2 23 10/26/2023 2324   BUN 12 10/26/2023 2324   BUN 8 01/12/2023 1607   CREATININE 0.78 10/26/2023 2324   GLUCOSE 97 10/26/2023 2324   CALCIUM  8.5 (L) 10/26/2023 2324   CBC:    Component Value Date/Time   WBC 10.0 10/26/2023 2324   HGB 7.8 (L) 10/26/2023 2324   HGB 7.4 (L) 01/12/2023 1607   HCT 21.7 (L) 10/26/2023 2324   HCT 21.7 (L) 01/12/2023 1607   PLT 366 10/26/2023 2324   PLT 321 01/12/2023 1607   MCV 111.3 (H) 10/26/2023 2324   MCV 94 01/12/2023 1607   NEUTROABS 2.9 10/26/2023 2324   NEUTROABS 3.8 01/12/2023 1607   LYMPHSABS 6.1 (H) 10/26/2023 2324   LYMPHSABS 5.6 (H) 01/12/2023 1607   MONOABS 0.8 10/26/2023 2324   EOSABS 0.1 10/26/2023 2324   EOSABS 0.2 01/12/2023 1607   BASOSABS 0.1 10/26/2023 2324   BASOSABS 0.1 01/12/2023 1607    Recent Results (from the past 240 hours)  Respiratory (~20 pathogens) panel by PCR     Status: None  Collection Time: 10/27/23  4:43 AM   Specimen: Nasopharyngeal Swab; Respiratory  Result Value Ref Range Status   Adenovirus NOT DETECTED NOT DETECTED Final   Coronavirus 229E NOT DETECTED NOT DETECTED Final    Comment: (NOTE) The Coronavirus on the Respiratory Panel, DOES NOT test for the novel  Coronavirus (2019 nCoV)    Coronavirus HKU1 NOT DETECTED NOT DETECTED Final   Coronavirus NL63 NOT DETECTED NOT DETECTED Final   Coronavirus OC43 NOT DETECTED NOT DETECTED Final   Metapneumovirus NOT DETECTED NOT DETECTED Final   Rhinovirus / Enterovirus NOT DETECTED NOT DETECTED Final   Influenza A NOT DETECTED NOT DETECTED Final   Influenza B NOT DETECTED NOT  DETECTED Final   Parainfluenza Virus 1 NOT DETECTED NOT DETECTED Final   Parainfluenza Virus 2 NOT DETECTED NOT DETECTED Final   Parainfluenza Virus 3 NOT DETECTED NOT DETECTED Final   Parainfluenza Virus 4 NOT DETECTED NOT DETECTED Final   Respiratory Syncytial Virus NOT DETECTED NOT DETECTED Final   Bordetella pertussis NOT DETECTED NOT DETECTED Final   Bordetella Parapertussis NOT DETECTED NOT DETECTED Final   Chlamydophila pneumoniae NOT DETECTED NOT DETECTED Final   Mycoplasma pneumoniae NOT DETECTED NOT DETECTED Final    Comment: Performed at [facility], [address] [place], [place] [address]    Studies/Results: DG Chest Port 1 View Result Date: 10/27/2023 CLINICAL DATA:  Left-sided chest pain. EXAM: PORTABLE CHEST 1 VIEW COMPARISON:  PA Lat chest and chest CT no contrast both 10/15/2023 FINDINGS: Right chest port with IJ approach catheter again terminates in the distal SVC. Hyperinflation and scattered scarring changes of the lung fields are again noted with stable areas of architectural distortion, and chronic ground-glass disease better visualized on CT. No active lung infiltrate is seen, no substantial pleural effusion. The cardiomediastinal silhouette and vascular markings are normal. No edema is seen. Thoracic cage is intact. IMPRESSION: No evidence of acute chest disease. Chronic changes including overinflation and scarring. Electronically Signed   By: Francis Quam M.D.   On: 10/27/2023 00:23     Medications: Scheduled Meds:  abacavir -dolutegravir -lamiVUDine   1 tablet Oral Daily   apixaban   5 mg Oral BID   Chlorhexidine  Gluconate Cloth  6 each Topical Daily   Deferiprone   500 mg Oral Daily   folic acid   500 mcg Oral Daily   HYDROmorphone    Intravenous Q4H   hydrOXYzine   25 mg Oral Once   sodium chloride  flush  10-40 mL Intracatheter Q12H   Continuous Infusions:   PRN Meds:.acetaminophen  **OR** acetaminophen , albuterol , cyclobenzaprine , diphenhydrAMINE ,  gabapentin , naloxone  **AND** sodium chloride  flush, ondansetron , oxyCODONE -acetaminophen  **AND** [DISCONTINUED] oxyCODONE   Consultants: None  Procedures: None  Antibiotics: None  Assessment/Plan: Principal Problem:   Sickle cell pain crisis (HCC) Active Problems:   Chronic pain syndrome   History of pulmonary embolism   History of HIV infection (HCC)   Chronic hypoxic respiratory failure (HCC)   Hb Sickle Cell Disease with Pain crisis: Pain is gradually improving, continue IVF 0.45% Saline @KVO , continue weight based Dilaudid  PCA, continue oral home pain medications as ordered. Monitor vitals very closely, Re-evaluate pain scale regularly, 2 L of Oxygen  by [place]. Patient encouraged to ambulate on the hallway today.  Leukocytosis: Stable Anemia of Chronic Disease: CBC 7.8 g/dL slightly lower than patient's baseline.  No need for transfusion at this time we will continue to monitor CBC. Chronic pain Syndrome: Continue oral home pain medication. Anxiety: Stable, continue medication as prescribed. Chronic hypoxic respiratory failure: Stable, O2 saturation  at 92%, continue 2 L oxygen  via nasal cannula. History of pulmonary embolism: Continue Eliquis  as prescribed. History of HIV infection: CD4 T Cell Abs  827. continue abacavir -dolutegravir -lamiVUDine  (TRIUMEQ ) as prescribed.  Code Status: Full Code Family Communication: N/A Disposition Plan: Not yet ready for discharge  [name] Cover NP If 7PM-7AM, please contact night-coverage.  10/28/2023, 11:45 AM  LOS: 0 days

## 2023-10-28 NOTE — Plan of Care (Signed)

## 2023-10-29 ENCOUNTER — Encounter: Payer: Self-pay | Admitting: Nurse Practitioner

## 2023-10-29 LAB — CBC
HCT: 21.3 % — ABNORMAL LOW (ref 39.0–52.0)
Hemoglobin: 7.6 g/dL — ABNORMAL LOW (ref 13.0–17.0)
MCH: 39 pg — ABNORMAL HIGH (ref 26.0–34.0)
MCHC: 35.7 g/dL (ref 30.0–36.0)
MCV: 109.2 fL — ABNORMAL HIGH (ref 80.0–100.0)
Platelets: 269 K/uL (ref 150–400)
RBC: 1.95 MIL/uL — ABNORMAL LOW (ref 4.22–5.81)
RDW: 25 % — ABNORMAL HIGH (ref 11.5–15.5)
WBC: 11.2 K/uL — ABNORMAL HIGH (ref 4.0–10.5)
nRBC: 13.3 % — ABNORMAL HIGH (ref 0.0–0.2)

## 2023-10-29 MED ORDER — HEPARIN SOD (PORK) LOCK FLUSH 100 UNIT/ML IV SOLN
500.0000 [IU] | INTRAVENOUS | Status: AC | PRN
  Administered 2023-10-29: 500 [IU]
  Filled 2023-10-29: qty 5

## 2023-10-29 MED ORDER — HYDROMORPHONE 1 MG/ML IV SOLN
INTRAVENOUS | Status: AC
  Administered 2023-10-29: 30 mg via INTRAVENOUS
  Filled 2023-10-29: qty 30

## 2023-10-29 MED ORDER — SALINE SPRAY 0.65 % NA SOLN
1.0000 | NASAL | Status: DC | PRN
  Filled 2023-10-29: qty 44

## 2023-10-29 NOTE — TOC Transition Note (Signed)
 Transition of Care [facility]) - Discharge Note   Patient Details  Name: Martin [name] MRN: [id number] Date of Birth: 1990-08-25  Transition of Care [facility]) CM/SW Contact:  Bascom Service, RN Phone Number: 10/29/2023, 1:38 PM   Clinical Narrative:  d/c home no CM needs.     Final next level of care: Home/Self Care Barriers to Discharge: No Barriers Identified   Patient Goals and CMS Choice Patient states their goals for this hospitalization and ongoing recovery are:: Home CMS Medicare.gov Compare Post Acute Care list provided to:: Patient Choice offered to / list presented to : Patient [place] ownership interest in [facility].provided to:: Patient    Discharge Placement                       Discharge Plan and Services Additional resources added to the After Visit Summary for     Discharge Planning Services: CM Consult                                 Social Drivers of Health (SDOH) Interventions SDOH Screenings   Food Insecurity: No Food Insecurity (10/27/2023)  Recent Concern: Food Insecurity - Food Insecurity Present (08/03/2023)  Housing: Low Risk  (10/27/2023)  Transportation Needs: No Transportation Needs (10/27/2023)  Recent Concern: Transportation Needs - Unmet Transportation Needs (10/12/2023)  Utilities: Not At Risk (10/27/2023)  Recent Concern: Utilities - At Risk (10/12/2023)  Depression (PHQ2-9): Low Risk  (10/20/2023)  Recent Concern: Depression (PHQ2-9) - Medium Risk (08/04/2023)  Financial Resource Strain: Low Risk  (10/09/2023)   [facility] from [facility]  Recent Concern: Financial Resource Strain - Medium Risk (08/03/2023)  Physical Activity: Sufficiently Active (10/09/2023)   Received from [facility]  Social Connections: Socially Isolated (10/09/2023)   Received from [facility]  Stress: No Stress Concern Present (10/09/2023)   Received from [facility]  Tobacco Use: Low Risk  (10/27/2023)     Readmission Risk  Interventions    10/13/2023    1:11 PM 08/12/2023    4:01 PM 03/16/2023    9:37 AM  Readmission Risk Prevention Plan  Transportation Screening Complete Complete Complete  Medication Review Oceanographer) Complete Complete Complete  PCP or Specialist appointment within 3-5 days of discharge Complete Complete Complete  HRI or Home Care Consult  Complete Complete  SW Recovery Care/Counseling Consult Complete Complete Complete  Palliative Care Screening Not Applicable Not Applicable Not Applicable  Skilled Nursing Facility Not Applicable Not Applicable Not Applicable

## 2023-10-29 NOTE — Discharge Summary (Signed)
 Physician Discharge Summary  Martin [name] FMW:[id number] DOB: 1990-07-19 DOA: 10/26/2023  PCP: [name], [name], [name]  Admit date: 10/26/2023  Discharge date: 10/29/2023  Discharge Diagnoses:  Principal Problem:   Sickle cell pain crisis (HCC) Active Problems:   Chronic pain syndrome   History of pulmonary embolism   History of HIV infection (HCC)   Chronic hypoxic respiratory failure (HCC)   Sickle cell anemia (HCC)   Discharge Condition: Stable  Disposition:   Follow-up Information      [facility].   Specialty: Internal Medicine Why: Dr. Genevieve @ 1:20p Contact information: [address] [place]  [address] [phone]               Pt is discharged home in good condition and is to follow up with [name], [name], [name] this week to have labs evaluated. Martin [name] is instructed to increase activity slowly and balance with rest for the next few days, and use prescribed medication to complete treatment of pain  Diet: Regular Wt Readings from Last 3 Encounters:  10/28/23 64.4 kg  10/12/23 64.4 kg  10/03/23 63.5 kg    History of present illness:  Martin [name]  is a 33 y.o. male with history of sickle cell disease,  chronic pain syndrome, opiate dependency and tolerance, history of HIV, and history of PE on Xarelto.  Patient presented to the emergency department with generalize body pain and lower extremity pain. Patient reports that he has been taking his oxycodone  at home without improvement. He is on 3l oxygen  at home. Denies cough, fever, chest pain, diarrhea /vomiting, no urinary symptoms.. He has had no sick contacts and no recent travels   ED Course:  Patient was treated in the ED with IVF, IV pain medication with no improvement to symptoms. He is admitted in-patient for ongoing sickle cell pain management.  Blood pressure 104/65, pulse 79, temperature (!) 97.5 F (36.4 C), temperature  source Oral, resp. rate 19, height 6' 3 (1.905 m), weight 64.4 kg, SpO2 92%.       Labs Reviewed  COMPREHENSIVE METABOLIC PANEL WITH GFR - Abnormal; Notable for the following components:      Result Value     Calcium  8.2 (*)      Albumin 3.4 (*)      Total Bilirubin 11.5 (*)      All other components within normal limits  CBC WITH DIFFERENTIAL/PLATELET - Abnormal; Notable for the following components:    RBC 1.17 (*)      Hemoglobin 7.8(*)      HCT 21.7(*)      MCV 111.3 (*)      MCH 40.0 (*)      MCHC 35.9(*)      RDW 30.3 (*)      nRBC 2.9(*)      Lymphs Abs 6.1(*)      All other components within normal limits  RETICULOCYTES - Abnormal; Notable for the following components:    Retic Ct Pct 23.7(*)      RBC. 2.24 (*)      Retic Count, Absolute 529.6 (*)      Immature Retic Fract 30.1 (*)      All other components within normal limits    Hospital Course:  Patient was admitted for sickle cell pain crisis and managed appropriately with IVF, IV Dilaudid  via PCA as well as other adjunct therapies per sickle cell pain management protocols.  Patient is  reporting significant improvement to pain at baseline.  He is ambulating without assistance. Tolerating p.o. without nausea or vomiting.  Patient request to be discharged. Patient was therefore discharged home today in a hemodynamically stable condition.   Martin [name] will follow-up with PCP within 1 week of this discharge. Martin [name] was counseled extensively about nonpharmacologic means of pain management, patient verbalized understanding and was appreciative of  the care received during this admission.   We discussed the need for good hydration, monitoring of hydration status, avoidance of heat, cold, stress, and infection triggers. We discussed the need to be adherent with taking other home medications. Patient was reminded of the need to seek medical attention immediately if any symptom of bleeding, anemia, or infection  occurs.  Discharge Exam: Vitals:   10/29/23 1154 10/29/23 1201  BP: 104/69   Pulse: 83   Resp: 20 18  Temp: 98.3 F (36.8 C)   SpO2: 93% 93%   Vitals:   10/29/23 0847 10/29/23 1042 10/29/23 1154 10/29/23 1201  BP:   104/69   Pulse:   83   Resp: 16 14 20 18   Temp:   98.3 F (36.8 C)   TempSrc:   Oral   SpO2: 93% 93% 93% 93%  Weight:      Height:        General appearance : Awake, alert, not in any distress. Speech Clear. Not toxic looking HEENT: Atraumatic and Normocephalic, pupils equally reactive to light and accomodation Neck: Supple, no JVD. No cervical lymphadenopathy.  Chest: Good air entry bilaterally, no added sounds  CVS: S1 S2 regular, no murmurs.  Abdomen: Bowel sounds present, Non tender and not distended with no gaurding, rigidity or rebound. Extremities: B/L Lower Ext shows no edema, both legs are warm to touch Neurology: Awake alert, and oriented X 3, CN II-XII intact, Non focal Skin: No Rash  Discharge Instructions  Discharge Instructions     Call MD for:  severe uncontrolled pain   Complete by: As directed    Call MD for:  temperature >100.4   Complete by: As directed    Diet - low sodium heart healthy   Complete by: As directed    Increase activity slowly   Complete by: As directed           The results of significant diagnostics from this hospitalization (including imaging, microbiology, ancillary and laboratory) are listed below for reference.    Significant Diagnostic Studies: DG Chest Port 1 View Result Date: 10/27/2023 CLINICAL DATA:  Left-sided chest pain. EXAM: PORTABLE CHEST 1 VIEW COMPARISON:  PA Lat chest and chest CT no contrast both 10/15/2023 FINDINGS: Right chest port with IJ approach catheter again terminates in the distal SVC. Hyperinflation and scattered scarring changes of the lung fields are again noted with stable areas of architectural distortion, and chronic ground-glass disease better visualized on CT. No active lung  infiltrate is seen, no substantial pleural effusion. The cardiomediastinal silhouette and vascular markings are normal. No edema is seen. Thoracic cage is intact. IMPRESSION: No evidence of acute chest disease. Chronic changes including overinflation and scarring. Electronically Signed   By: Francis Quam M.D.   On: 10/27/2023 00:23   CT CHEST WO CONTRAST Result Date: 10/15/2023 CLINICAL DATA:  Chest pain.  History of sickle cell disease. EXAM: CT CHEST WITHOUT CONTRAST TECHNIQUE: Multidetector CT imaging of the chest was performed following the standard protocol without IV contrast. RADIATION DOSE REDUCTION: This exam was performed according to the departmental dose-optimization program which includes  automated exposure control, adjustment of the mA and/or kV according to patient size and/or use of iterative reconstruction technique. COMPARISON:  CTA chest dated 10/12/2023. FINDINGS: Cardiovascular: Cardiomegaly, unchanged. Similar trace pericardial effusion. Similar enlarged pulmonary trunk measuring up to 3.7 cm, which can be seen in the setting of pulmonary arterial hypertension. Stable right chest port catheter tip terminates at the level of the superior cavoatrial junction. Mediastinum/Nodes: Stable 1.4 cm subcarinal lymph node (2:81). Similar prominent axillary nodes. The previously noted prominent right hilar lymph nodes are not well evaluated on this noncontrast examination. Redemonstrated substernal thymus. Thyroid gland, trachea, and esophagus demonstrate no significant findings. Lungs/Pleura: Redemonstrated extensive multifocal scarring with areas of architectural distortion chronic ground-glass changes, with a lower lung zone predominance. No new focal airspace opacity. No pleural effusion or pneumothorax. Upper Abdomen: No acute abnormality. Auto infarcted calcified spleen. Musculoskeletal: No acute osseous abnormality. Bony stigmata of sickle cell disease. IMPRESSION: 1. Similar extensive  pulmonary parenchymal scarring, architectural distortion, and chronic ground-glass changes with a lower lung zone predominance. These findings could reflect sequela of chronic pulmonary infarcts. No new focal airspace opacity. 2. Stable mildly prominent subcarinal and axillary nodes. 3. Stable enlarged pulmonary trunk, which can be seen in the setting of pulmonary arterial hypertension. 4. Additional unchanged chronic findings, as above. Electronically Signed   By: Harrietta Sherry M.D.   On: 10/15/2023 13:57   DG Chest 2 View Result Date: 10/15/2023 CLINICAL DATA:  Chest pain.  History of sickle cell disease. EXAM: CHEST - 2 VIEW COMPARISON:  CTA chest dated 10/12/2023. Chest radiograph dated 08/10/2023. FINDINGS: Stable cardiomediastinal silhouette. Right chest Port-A-Cath tip overlies the lower SVC. Similar bibasilar predominant interstitial coarsening with underlying haziness, corresponding to chronic ground-glass changes as noted on the prior CT. No appreciable acute focal airspace consolidation. No pleural effusion or pneumothorax. No acute osseous abnormality. IMPRESSION: Similar bibasilar predominant interstitial coarsening with underlying haziness, corresponding to chronic ground-glass changes as noted on the prior CT. No appreciable acute focal airspace consolidation. Electronically Signed   By: Harrietta Sherry M.D.   On: 10/15/2023 13:43   CT Angio Chest PE W and/or Wo Contrast Result Date: 10/12/2023 CLINICAL DATA:  Sickle cell crisis. Evaluate for pulmonary embolism. No relief of chest pain with usual home meds. EXAM: CT ANGIOGRAPHY CHEST WITH CONTRAST TECHNIQUE: Multidetector CT imaging of the chest was performed using the standard protocol during bolus administration of intravenous contrast. Multiplanar CT image reconstructions and MIPs were obtained to evaluate the vascular anatomy. RADIATION DOSE REDUCTION: This exam was performed according to the departmental dose-optimization program which  includes automated exposure control, adjustment of the mA and/or kV according to patient size and/or use of iterative reconstruction technique. CONTRAST:  75mL OMNIPAQUE  IOHEXOL  350 MG/ML SOLN COMPARISON:  CTA chest 08/13/2023, chest CT without contrast 09/25/2022 FINDINGS: Cardiovascular: There is a right chest port with IJ approach catheter terminating about the superior cavoatrial junction. There is cardiomegaly with a right chamber predominance and RV/LV ratio of 1.3. There is contrast reflux into the IVC and hepatic veins which may be seen with right heart dysfunction or tricuspid regurgitation. There is a small pericardial effusion anteriorly to the right. Enlarged pulmonary trunk again measuring 3.7 cm indicating arterial hypertension. Respiratory motion, especially in the bases, largely obscures the subsegmental pulmonary arteries. No arterial embolus is seen at least to the segmental level. Pulmonary veins are nondistended. The aorta and great vessels are unremarkable. Mediastinum/Nodes: Stable mildly prominent right hilar lymph nodes, largest 1.3 cm in short axis  on 5:61. Stable subcarinal lymph nodes up to 1.4 cm. Substernal thymus appears similar. The esophagus is patulous but not thickened. There are borderline prominent bilateral axillary space lymph nodes, also unchanged. The trachea and central airways are clear.  Negative thyroid gland. Lungs/Pleura: Extensive multifocal scarring change and architectural distortion both lungs with underlying chronic ground-glass disease, with the ground-glass opacities most apparent and confluent in the lower lung fields. No new or grossly active infiltrate is seen. There is diffuse bronchial thickening. No new opacity is seen or focal nodules. No pleural effusion. No pneumothorax. Upper Abdomen: Autoinfarcted calcified and shrunken spleen. No acute abnormality. Musculoskeletal: Dense bones consistent with sickle cell disease. Mild thoracic dextroscoliosis. No acute  or other significant osseous findings or chest wall abnormality. Review of the MIP images confirms the above findings. IMPRESSION: 1. No evidence of arterial embolus at least to the segmental level. Respiratory motion largely obscures the subsegmental pulmonary arteries. 2. Cardiomegaly with right chamber predominance and contrast reflux into the IVC and hepatic veins, the latter which may be seen with right heart dysfunction or tricuspid regurgitation. 3. Enlarged pulmonary trunk indicating arterial hypertension. 4. Extensive multifocal scarring change and architectural distortion both lungs with underlying chronic ground-glass disease. No new or grossly active infiltrate is seen. 5. Diffuse bronchial thickening. 6. Stable mildly prominent right hilar, subcarinal and axillary space lymph nodes. 7. Autoinfarcted calcified and shrunken spleen. 8. Dense bones consistent with sickle cell disease. Electronically Signed   By: Francis Quam M.D.   On: 10/12/2023 04:47    Microbiology: Recent Results (from the past 240 hours)  Respiratory (~20 pathogens) panel by PCR     Status: None   Collection Time: 10/27/23  4:43 AM   Specimen: Nasopharyngeal Swab; Respiratory  Result Value Ref Range Status   Adenovirus NOT DETECTED NOT DETECTED Final   Coronavirus 229E NOT DETECTED NOT DETECTED Final    Comment: (NOTE) The Coronavirus on the Respiratory Panel, DOES NOT test for the novel  Coronavirus (2019 nCoV)    Coronavirus HKU1 NOT DETECTED NOT DETECTED Final   Coronavirus NL63 NOT DETECTED NOT DETECTED Final   Coronavirus OC43 NOT DETECTED NOT DETECTED Final   Metapneumovirus NOT DETECTED NOT DETECTED Final   Rhinovirus / Enterovirus NOT DETECTED NOT DETECTED Final   Influenza A NOT DETECTED NOT DETECTED Final   Influenza B NOT DETECTED NOT DETECTED Final   Parainfluenza Virus 1 NOT DETECTED NOT DETECTED Final   Parainfluenza Virus 2 NOT DETECTED NOT DETECTED Final   Parainfluenza Virus 3 NOT DETECTED NOT  DETECTED Final   Parainfluenza Virus 4 NOT DETECTED NOT DETECTED Final   Respiratory Syncytial Virus NOT DETECTED NOT DETECTED Final   Bordetella pertussis NOT DETECTED NOT DETECTED Final   Bordetella Parapertussis NOT DETECTED NOT DETECTED Final   Chlamydophila pneumoniae NOT DETECTED NOT DETECTED Final   Mycoplasma pneumoniae NOT DETECTED NOT DETECTED Final    Comment: Performed at [facility], [address] [place], [place] [address]     Labs: Basic Metabolic Panel: Recent Labs  Lab 10/26/23 2324  NA 141  K 4.0  CL 110  CO2 23  GLUCOSE 97  BUN 12  CREATININE 0.78  CALCIUM  8.5*   Liver Function Tests: Recent Labs  Lab 10/26/23 2324  AST 23  ALT 14  ALKPHOS 56  BILITOT 6.4*  PROT 7.9  ALBUMIN 3.8   No results for input(s): LIPASE, AMYLASE in the last 168 hours. No results for input(s): AMMONIA in the last  168 hours. CBC: Recent Labs  Lab 10/26/23 2324 10/28/23 1250 10/29/23 0410  WBC 10.0 11.2* 11.2*  NEUTROABS 2.9  --   --   HGB 7.8* 7.9* 7.6*  HCT 21.7* 21.9* 21.3*  MCV 111.3* 109.0* 109.2*  PLT 366 313 269   Cardiac Enzymes: No results for input(s): CKTOTAL, CKMB, CKMBINDEX, TROPONINI in the last 168 hours. BNP: Invalid input(s): POCBNP CBG: No results for input(s): GLUCAP in the last 168 hours.  Time coordinating discharge: 50 minutes  Signed:  [name] Cover NP  [facility] 10/29/2023, 12:51 PM

## 2023-10-29 NOTE — Progress Notes (Signed)
 Discharge instructions given to patient no questions at this time D Johnie RN

## 2023-11-01 ENCOUNTER — Other Ambulatory Visit: Payer: Self-pay | Admitting: Nurse Practitioner

## 2023-11-01 ENCOUNTER — Emergency Department (HOSPITAL_COMMUNITY)
Admission: EM | Admit: 2023-11-01 | Discharge: 2023-11-02 | Disposition: A | Discharge status: Home / Self Care | Payer: Self-pay | Attending: Emergency Medicine | Admitting: Emergency Medicine

## 2023-11-01 ENCOUNTER — Telehealth: Payer: Self-pay

## 2023-11-01 DIAGNOSIS — Z21 Asymptomatic human immunodeficiency virus [HIV] infection status: Secondary | ICD-10-CM | POA: Insufficient documentation

## 2023-11-01 DIAGNOSIS — D571 Sickle-cell disease without crisis: Secondary | ICD-10-CM

## 2023-11-01 DIAGNOSIS — D57219 Sickle-cell/Hb-C disease with crisis, unspecified: Secondary | ICD-10-CM | POA: Insufficient documentation

## 2023-11-01 DIAGNOSIS — F1190 Opioid use, unspecified, uncomplicated: Secondary | ICD-10-CM

## 2023-11-01 DIAGNOSIS — Z7901 Long term (current) use of anticoagulants: Secondary | ICD-10-CM | POA: Insufficient documentation

## 2023-11-01 DIAGNOSIS — D5700 Hb-SS disease with crisis, unspecified: Secondary | ICD-10-CM

## 2023-11-01 NOTE — ED Triage Notes (Signed)
 Patient c/o sickle cell crisis all day today. Patient c/o generalized pain and mostly in his lower back.

## 2023-11-01 NOTE — Transitions of Care (Post Inpatient/ED Visit) (Signed)
   11/01/2023  Name: ADAM SANJUAN MRN: [id number] DOB: [birth year]  Today's TOC FU Call Status: Today's TOC FU Call Status:: Unsuccessful Call (1st Attempt) Unsuccessful Call (1st Attempt) Date: 11/01/23  Attempted to reach the patient regarding the most recent Inpatient/ED visit.  Follow Up Plan: Additional outreach attempts will be made to reach the patient to complete the Transitions of Care (Post Inpatient/ED visit) call.   Arvin Seip RN, BSN, CCM [employer], Population Health Case Manager Phone: [phone]

## 2023-11-01 NOTE — Telephone Encounter (Signed)
 Copied from CRM [id number]. Topic: Clinical - Medication Refill >> Nov 01, 2023  1:18 PM Rosaria E wrote: Medication: oxyCODONE -acetaminophen  (PERCOCET) 10-325 MG tablet   Has the patient contacted their pharmacy? Yes (Agent: If no, request that the patient contact the pharmacy for the refill. If patient does not wish to contact the pharmacy document the reason why and proceed with request.) (Agent: If yes, when and what did the pharmacy advise?)  This is the patient's preferred pharmacy:  [place] [facility] [address] [place] [address] Phone: [phone] Fax: [phone]  Is this the correct pharmacy for this prescription? Yes If no, delete pharmacy and type the correct one.   Has the prescription been filled recently? Yes  Is the patient out of the medication? Yes  Has the patient been seen for an appointment in the last year OR does the patient have an upcoming appointment? Yes  Can we respond through MyChart? Yes  Agent: Please be advised that Rx refills may take up to 3 business days. We ask that you follow-up with your pharmacy.

## 2023-11-02 ENCOUNTER — Telehealth: Payer: Self-pay

## 2023-11-02 ENCOUNTER — Other Ambulatory Visit: Payer: Self-pay | Admitting: Nurse Practitioner

## 2023-11-02 ENCOUNTER — Other Ambulatory Visit (HOSPITAL_COMMUNITY): Payer: Self-pay

## 2023-11-02 DIAGNOSIS — F1190 Opioid use, unspecified, uncomplicated: Secondary | ICD-10-CM

## 2023-11-02 DIAGNOSIS — D571 Sickle-cell disease without crisis: Secondary | ICD-10-CM

## 2023-11-02 LAB — COMPREHENSIVE METABOLIC PANEL WITH GFR
ALT: 18 U/L (ref 0–44)
AST: 35 U/L (ref 15–41)
Albumin: 3.7 g/dL (ref 3.5–5.0)
Alkaline Phosphatase: 57 U/L (ref 38–126)
Anion gap: 7 (ref 5–15)
BUN: 13 mg/dL (ref 6–20)
CO2: 22 mmol/L (ref 22–32)
Calcium: 8.2 mg/dL — ABNORMAL LOW (ref 8.9–10.3)
Chloride: 109 mmol/L (ref 98–111)
Creatinine, Ser: 0.95 mg/dL (ref 0.61–1.24)
GFR, Estimated: >60 mL/min (ref >60)
Glucose, Bld: 91 mg/dL (ref 70–99)
Potassium: 3.8 mmol/L (ref 3.5–5.1)
Sodium: 138 mmol/L (ref 135–145)
Total Bilirubin: 9.5 mg/dL — ABNORMAL HIGH (ref 0.0–1.2)
Total Protein: 8.1 g/dL (ref 6.5–8.1)

## 2023-11-02 LAB — CBC WITH DIFFERENTIAL/PLATELET
Abs Immature Granulocytes: 0.04 K/uL (ref 0.00–0.07)
Basophils Absolute: 0.1 K/uL (ref 0.0–0.1)
Basophils Relative: 1 %
Eosinophils Absolute: 0.3 K/uL (ref 0.0–0.5)
Eosinophils Relative: 2 %
HCT: 21.2 % — ABNORMAL LOW (ref 39.0–52.0)
Hemoglobin: 7.7 g/dL — ABNORMAL LOW (ref 13.0–17.0)
Immature Granulocytes: 0 %
Lymphocytes Relative: 47 %
Lymphs Abs: 5.1 K/uL — ABNORMAL HIGH (ref 0.7–4.0)
MCH: 38.9 pg — ABNORMAL HIGH (ref 26.0–34.0)
MCHC: 36.3 g/dL — ABNORMAL HIGH (ref 30.0–36.0)
MCV: 107.1 fL — ABNORMAL HIGH (ref 80.0–100.0)
Monocytes Absolute: 1.4 K/uL — ABNORMAL HIGH (ref 0.1–1.0)
Monocytes Relative: 14 %
Neutro Abs: 3.8 K/uL (ref 1.7–7.7)
Neutrophils Relative %: 36 %
Platelets: 267 K/uL (ref 150–400)
RBC: 1.98 MIL/uL — ABNORMAL LOW (ref 4.22–5.81)
RDW: 26 % — ABNORMAL HIGH (ref 11.5–15.5)
WBC: 10.7 K/uL — ABNORMAL HIGH (ref 4.0–10.5)
nRBC: 13.4 % — ABNORMAL HIGH (ref 0.0–0.2)

## 2023-11-02 LAB — RETICULOCYTES
Immature Retic Fract: 40.1 % — ABNORMAL HIGH (ref 2.3–15.9)
RBC.: 2.05 MIL/uL — ABNORMAL LOW (ref 4.22–5.81)
Retic Count, Absolute: 384 K/uL — ABNORMAL HIGH (ref 19.0–186.0)
Retic Ct Pct: 18.7 % — ABNORMAL HIGH (ref 0.4–3.1)

## 2023-11-02 MED ORDER — ONDANSETRON HCL 4 MG/2ML IJ SOLN
4.0000 mg | INTRAMUSCULAR | Status: DC | PRN
  Filled 2023-11-02: qty 2

## 2023-11-02 MED ORDER — SODIUM CHLORIDE 0.45 % IV SOLN: INTRAVENOUS | Status: DC

## 2023-11-02 MED ORDER — HEPARIN SOD (PORK) LOCK FLUSH 100 UNIT/ML IV SOLN
INTRAVENOUS | Status: AC
  Administered 2023-11-02: 500 [IU]
  Filled 2023-11-02: qty 5

## 2023-11-02 MED ORDER — OXYCODONE-ACETAMINOPHEN 10-325 MG PO TABS
1.0000 | ORAL_TABLET | ORAL | 0 refills | Status: DC | PRN
  Filled 2023-11-03: qty 90, 15d supply, fill #0
  Filled ????-??-??: fill #0

## 2023-11-02 MED ORDER — HYDROMORPHONE HCL 1 MG/ML IJ SOLN
2.0000 mg | Freq: Once | INTRAMUSCULAR | Status: AC
  Administered 2023-11-02: 2 mg via INTRAVENOUS
  Filled 2023-11-02: qty 2

## 2023-11-02 MED ORDER — DIPHENHYDRAMINE HCL 25 MG PO CAPS
25.0000 mg | ORAL_CAPSULE | ORAL | Status: DC | PRN
  Filled 2023-11-02: qty 2

## 2023-11-02 MED ORDER — HYDROMORPHONE HCL 1 MG/ML IJ SOLN: 2.0000 mg | Freq: Once | INTRAMUSCULAR | Status: DC

## 2023-11-02 MED ORDER — HYDROMORPHONE HCL 1 MG/ML IJ SOLN
2.0000 mg | INTRAMUSCULAR | Status: AC | PRN
  Administered 2023-11-02 (×2): 2 mg via INTRAVENOUS
  Filled 2023-11-02 (×2): qty 2

## 2023-11-02 NOTE — Telephone Encounter (Signed)
 Please advise [facility]

## 2023-11-02 NOTE — Telephone Encounter (Signed)
 Error

## 2023-11-02 NOTE — ED Provider Notes (Signed)
 [facility] AT [facility] Provider Note  CSN: [id number] Arrival date & time: 11/01/23 2227  Chief Complaint(s) Sickle Cell Pain Crisis  HPI Martin [name] is a 33 y.o. male with a past medical history listed below including sickle cell disease who presents to the emergency department with generalized pain mostly in the lower back consistent with prior pain related to sickle cell.  No trauma.  No fevers or chills.  No other physical complaints.  Patient reports running out of his home pain medicine.  States that the sickle cell clinic was full and unable to take him.  The history is provided by the patient.    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   Influenza A with pneumonia 06/20/2023   Acute hypoxemic respiratory failure (HCC) 06/20/2023   AKI (acute kidney injury) (HCC) 06/20/2023   Influenza A with respiratory manifestations 06/20/2023   Acute on chronic anemia 03/16/2023   History of HIV infection (HCC) 03/16/2023   Hypokalemia 03/16/2023   Anemia of chronic disease 07/13/2022   Hyperbilirubinemia 07/13/2022   Tinea capitis 07/29/2021   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HCC) 06/24/2021   Bacteremia due to Enterococcus 08/29/2020   Acute chest syndrome (HCC) 08/26/2020   Hypoxia 07/09/2020   COVID-19 05/13/2020   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/18/2020   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   Abnormal penile discharge, without blood 02/22/2020   Leukocytosis 01/02/2020   Anxiety 11/27/2019   Proteinuria 11/27/2019   Acute on chronic respiratory failure with  hypoxia (HCC) 11/16/2019   Chronic, continuous use of opioids 08/15/2019   Seasonal allergies 08/15/2019   Chest congestion    Sickle cell anemia with crisis (HCC) 08/04/2019   Chronic pain syndrome 08/04/2019   History of pulmonary embolism 08/04/2019   Single subsegmental pulmonary embolism without acute cor pulmonale (HCC) 02/10/2019   Acute chest syndrome due to sickle cell crisis (HCC) 02/10/2019   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) 03/09/2018   Bone pain 03/07/2018   Hip pain 03/07/2018   Acute bronchitis due to Streptococcus 02/21/2018   Heart murmur 02/03/2017   Sickle cell disease with crisis (HCC) 02/02/2017   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 Authorizing Provider  abacavir -dolutegravir -lamiVUDine  (TRIUMEQ ) 600-50-300 MG tablet Take 1 tablet by mouth daily. Patient not taking: Reported on 10/27/2023 08/13/23   Pearlean Manus, MD  albuterol  (VENTOLIN  HFA) 108 (90 Base) MCG/ACT inhaler Inhale 2 puffs into the lungs every 6 (six) hours as needed for wheezing or shortness of breath. Patient not taking: Reported on 10/20/2023 08/13/23   Pearlean Manus, MD  apixaban  (ELIQUIS ) 5 MG TABS tablet Take 1 tablet (5 mg total)  by mouth 2 (two) times daily. 08/13/23   Pearlean Manus, MD  cyclobenzaprine  (FLEXERIL ) 10 MG tablet Take 10 mg by mouth 2 (two) times daily as needed for muscle spasms. Patient not taking: Reported on 10/27/2023 12/22/22   [provider]  Deferiprone  (FERRIPROX ) 500 MG TABS Take 1 tablet by mouth  daily. Patient not taking: Reported on 10/27/2023 08/13/23   Pearlean Manus, MD  diphenhydrAMINE  (BENADRYL ) 25 MG tablet Take 1 tablet (25 mg total) by mouth every 4 (four) hours as needed for itching. 08/13/23   Pearlean Manus, MD  folic acid  (FOLVITE ) 400 MCG tablet Take 1 tablet (400 mcg total) by mouth daily. Patient not taking: Reported on 10/20/2023 08/13/23   Pearlean Manus, MD  gabapentin  (NEURONTIN ) 300 MG capsule Take 1 capsule (300 mg total) by mouth at bedtime as needed (nerve pain). Patient not taking: Reported on 10/27/2023 08/13/23   Pearlean Manus, MD  guaiFENesin  (MUCINEX ) 600 MG 12 hr tablet Take 1 tablet (600 mg total) by mouth 2 (two) times daily. Patient not taking: Reported on 10/20/2023 08/13/23 08/12/24  Pearlean Manus, MD  hydroxyurea  (HYDREA ) 500 MG capsule Take 4 capsules (2,000 mg total) by mouth at bedtime. Patient not taking: Reported on 10/27/2023 08/13/23   Pearlean Manus, MD  NARCAN  4 MG/0.1ML LIQD nasal spray kit Place 1 spray into the nose as needed (respiratory distress / opioid overdose). 01/07/23   [provider]  oxyCODONE -acetaminophen  (PERCOCET) 10-325 MG tablet Take 1 tablet by mouth every 4 (four) hours as needed for pain. 10/19/23   [name], [name], [name]  pantoprazole  (PROTONIX ) 40 MG tablet Take 1 tablet (40 mg total) by mouth daily as needed (acid reflux). Patient not taking: Reported on 10/20/2023 08/13/23   Pearlean Manus, MD  senna-docusate (SENOKOT-S) 8.6-50 MG tablet Take 2 tablets by mouth at bedtime. Patient not taking: Reported on 10/20/2023 08/13/23   Pearlean Manus, MD  triamcinolone  cream (KENALOG ) 0.1 % Apply 1 Application topically 2 (two) times daily. Patient not taking: Reported on 10/27/2023 01/12/23   Tilford Bertram HERO, [name]                                                                                                                                    Allergies Ketorolac  tromethamine , Tape, and Wound dressing adhesive  Review of  Systems Review of Systems As noted in HPI  Physical Exam Vital Signs  I have reviewed the triage vital signs BP 106/74   Pulse 91   Temp 98.3 F (36.8 C) (Oral)   Resp 16   SpO2 95%   Physical Exam Vitals reviewed.  Constitutional:      General: He is not in acute distress.    Appearance: He is well-developed. He is not diaphoretic.  HENT:     Head: Normocephalic and atraumatic.     Right Ear: External ear normal.     Left Ear: External ear normal.  Nose: Nose normal.     Mouth/Throat:     Mouth: Mucous membranes are moist.  Eyes:     General: No scleral icterus.    Conjunctiva/sclera: Conjunctivae normal.  Neck:     Trachea: Phonation normal.  Cardiovascular:     Rate and Rhythm: Normal rate and regular rhythm.  Pulmonary:     Effort: Pulmonary effort is normal. No respiratory distress.     Breath sounds: No stridor.  Abdominal:     General: There is no distension.  Musculoskeletal:        General: Normal range of motion.     Cervical back: Normal range of motion.  Neurological:     Mental Status: He is alert and oriented to person, place, and time.  Psychiatric:        Behavior: Behavior normal.     ED Results and Treatments Labs (all labs ordered are listed, but only abnormal results are displayed) Labs Reviewed  CBC WITH DIFFERENTIAL/PLATELET - Abnormal; Notable for the following components:      Result Value   WBC 10.7 (*)    RBC 1.98 (*)    Hemoglobin 7.7 (*)    HCT 21.2 (*)    MCV 107.1 (*)    MCH 38.9 (*)    MCHC 36.3 (*)    RDW 26.0 (*)    nRBC 13.4 (*)    Lymphs Abs 5.1 (*)    Monocytes Absolute 1.4 (*)    All other components within normal limits  RETICULOCYTES - Abnormal; Notable for the following components:   Retic Ct Pct 18.7 (*)    RBC. 2.05 (*)    Retic Count, Absolute 384.0 (*)    Immature Retic Fract 40.1 (*)    All other components within normal limits  COMPREHENSIVE METABOLIC PANEL WITH GFR - Abnormal; Notable for the  following components:   Calcium  8.2 (*)    Total Bilirubin 9.5 (*)    All other components within normal limits                                                                                                                         EKG  EKG Interpretation Date/Time:    Ventricular Rate:    PR Interval:    QRS Duration:    QT Interval:    QTC Calculation:   R Axis:      Text Interpretation:         Radiology No results found.  Medications Ordered in ED Medications  0.45 % sodium chloride  infusion ( Intravenous New Bag/Given 11/02/23 0111)  diphenhydrAMINE  (BENADRYL ) capsule 25-50 mg (has no administration in time range)  ondansetron  (ZOFRAN ) injection 4 mg (has no administration in time range)  HYDROmorphone  (DILAUDID ) injection 2 mg (2 mg Intravenous Given 11/02/23 0108)  HYDROmorphone  (DILAUDID ) injection 2 mg (2 mg Intravenous Given 11/02/23 0308)   Procedures Procedures  (including critical care time) Medical Decision Making / ED Course   Medical Decision Making  Amount and/or Complexity of Data Reviewed Labs: ordered. Decision-making details documented in ED Course. Radiology: ordered and independent interpretation performed. Decision-making details documented in ED Course.  Risk Prescription drug management. Parenteral controlled substances. Decision regarding hospitalization.     Clinical Course as of 11/02/23 0403  Tue Nov 02, 2023  0400 Generalized pain mostly in the lower back consistent with sickle cell pain.  Labs reassuring without evidence of hemolytic or aplastic crisis.  Provided with IV fluids and pain medicine.  Patient feels better and feels like he can go home. [PC]    Clinical Course User Index [PC] Reuel Lamadrid, Raynell Moder, MD    Final Clinical Impression(s) / ED Diagnoses Final diagnoses:  Sickle cell anemia with pain [facility])   The patient appears reasonably screened and/or stabilized for discharge and I doubt any other medical condition  or other [facility] requiring further screening, evaluation, or treatment in the ED at this time. I have discussed the findings, Dx and Tx plan with the patient/family who expressed understanding and agree(s) with the plan. Discharge instructions discussed at length. The patient/family was given strict return precautions who verbalized understanding of the instructions. No further questions at time of discharge.  Disposition: Discharge  Condition: Good  ED Discharge Orders     None         Follow Up: [name], [name], [name] [address] [place], [place] [address] [phone]  Call  to schedule an appointment for close follow up    This chart was dictated using voice recognition software.  Despite best efforts to proofread,  errors can occur which can change the documentation meaning.    Trine Raynell Moder, MD 11/02/23 [phone]

## 2023-11-02 NOTE — Transitions of Care (Post Inpatient/ED Visit) (Signed)
 11/02/2023  Name: Martin [name] MRN: [id number] DOB: 03/07/1991  Today's TOC FU Call Status: Today's TOC FU Call Status:: Successful TOC FU Call Completed Unsuccessful Call (1st Attempt) Date: 11/01/23 [facility] FU Call Complete Date: 11/02/23 Patient's Name and Date of Birth confirmed.  Transition Care Management Follow-up Telephone Call Date of Discharge: 10/29/23 Discharge Facility: [facility]) Type of Discharge: Inpatient Admission Primary Inpatient Discharge Diagnosis:: Sickle Cell Pain crisis How have you been since you were released from the hospital?: Worse Any questions or concerns?: Yes Patient Questions/Concerns:: I can't get my pain meds (Calls have placed to the provider to indicate the urgent need for pain medications) Patient Questions/Concerns Addressed: Notified Provider of Patient Questions/Concerns  Items Reviewed: Did you receive and understand the discharge instructions provided?: Yes Medications obtained,verified, and reconciled?: Yes (Medications Reviewed) Any new allergies since your discharge?: No Dietary orders reviewed?: Yes Type of Diet Ordered:: Heart Healthy Do you have support at home?: Yes  Medications Reviewed Today: Medications Reviewed Today     Reviewed by Moises Reusing, RN (Case Manager) on 11/02/23 at 1609  Med List Status: <None>   Medication Order Taking? Sig Documenting Provider Last Dose Status Informant  abacavir -dolutegravir -lamiVUDine  (TRIUMEQ ) 600-50-300 MG tablet [id number] No Take 1 tablet by mouth daily.  Patient not taking: Reported on 10/27/2023   Pearlean Manus, MD Not Taking Active Self, Pharmacy Records, Multiple Informants  albuterol  (VENTOLIN  HFA) 108 [phone] Base) MCG/ACT inhaler [id number] No Inhale 2 puffs into the lungs every 6 (six) hours as needed for wheezing or shortness of breath.  Patient not taking: Reported on 10/20/2023   Pearlean Manus, MD Not Taking Active Self, Pharmacy Records, Multiple Informants   apixaban  (ELIQUIS ) 5 MG TABS tablet [id number] No Take 1 tablet (5 mg total) by mouth 2 (two) times daily. Pearlean Manus, MD Past Week Active Self, Pharmacy Records, Multiple Informants           Med Note STEFFI, ADELITA   Wed Oct 27, 2023  7:36 AM) Most Recent Dispense   08/13/2023 5 MG TABS (disp 60, 30d supply)     cyclobenzaprine  (FLEXERIL ) 10 MG tablet [id number] No Take 10 mg by mouth 2 (two) times daily as needed for muscle spasms.  Patient not taking: Reported on 10/27/2023   [provider] Not Taking Active Self, Pharmacy Records, Multiple Informants  Deferiprone  (FERRIPROX ) 500 MG TABS [id number] No Take 1 tablet by mouth daily.  Patient not taking: Reported on 10/27/2023   Pearlean Manus, MD Not Taking Active Self, Pharmacy Records, Multiple Informants  diphenhydrAMINE  (BENADRYL ) 25 MG tablet [id number] No Take 1 tablet (25 mg total) by mouth every 4 (four) hours as needed for itching. Pearlean Manus, MD Past Week Active Self, Pharmacy Records, Multiple Informants  folic acid  (FOLVITE ) 400 MCG tablet [id number] No Take 1 tablet (400 mcg total) by mouth daily.  Patient not taking: Reported on 10/20/2023   Pearlean Manus, MD Not Taking Active Self, Pharmacy Records, Multiple Informants  gabapentin  (NEURONTIN ) 300 MG capsule [id number] No Take 1 capsule (300 mg total) by mouth at bedtime as needed (nerve pain).  Patient not taking: Reported on 10/27/2023   Pearlean Manus, MD Not Taking Active Self, Pharmacy Records, Multiple Informants  guaiFENesin  (MUCINEX ) 600 MG 12 hr tablet [id number] No Take 1 tablet (600 mg total) by mouth 2 (two) times daily.  Patient not taking: Reported on 10/20/2023   Pearlean Manus, MD Not Taking Active Self, Pharmacy Records, Multiple Informants  hydroxyurea  (HYDREA ) 500 MG capsule [id number]  No Take 4 capsules (2,000 mg total) by mouth at bedtime.  Patient not taking: Reported on 10/27/2023   Pearlean Manus, MD Not Taking Active Self,  Pharmacy Records, Multiple Informants  NARCAN  4 MG/0.1ML LIQD nasal spray kit [id number] No Place 1 spray into the nose as needed (respiratory distress / opioid overdose). [provider] Unknown Active Self, Pharmacy Records, Multiple Informants           Med Note MARISA, KATHY N   Tue Aug 10, 2023  2:21 PM)    oxyCODONE -acetaminophen  (PERCOCET) 10-325 MG tablet [id number] No Take 1 tablet by mouth every 4 (four) hours as needed for pain. [name], [name], [name] 10/26/2023 Active Self, Pharmacy Records, Multiple Informants  pantoprazole  (PROTONIX ) 40 MG tablet [id number] No Take 1 tablet (40 mg total) by mouth daily as needed (acid reflux).  Patient not taking: Reported on 10/20/2023   Pearlean Manus, MD Not Taking Active Self, Pharmacy Records, Multiple Informants  senna-docusate (SENOKOT-S) 8.6-50 MG tablet [id number] No Take 2 tablets by mouth at bedtime.  Patient not taking: Reported on 10/20/2023   Pearlean Manus, MD Not Taking Active Self, Pharmacy Records, Multiple Informants  triamcinolone  cream (KENALOG ) 0.1 % [id number] No Apply 1 Application topically 2 (two) times daily.  Patient not taking: Reported on 10/27/2023   Tilford Bertram HERO, [name] Not Taking Active Self, Pharmacy Records, Multiple Informants           Med Note MARISA, NATHANEL SAILOR   Tue Aug 10, 2023  2:21 PM)              Home Care and Equipment/Supplies: Were Home Health Services Ordered?: No Any new equipment or medical supplies ordered?: No  Functional Questionnaire: Do you need assistance with bathing/showering or dressing?: No Do you need assistance with meal preparation?: No Do you need assistance with eating?: No Do you have difficulty maintaining continence: No Do you need assistance with getting out of bed/getting out of a chair/moving?: No Do you have difficulty managing or taking your medications?: No  Follow up appointments reviewed: PCP Follow-up appointment confirmed?: Yes Date of PCP follow-up  appointment?: 11/03/23 Follow-up Provider: [name] Follow-up appointment confirmed?: NA Do you need transportation to your follow-up appointment?: No Do you understand care options if your condition(s) worsen?: Yes-patient verbalized understanding  SDOH Interventions Today    Flowsheet Row Most Recent Value  SDOH Interventions   Food Insecurity Interventions Intervention Not Indicated  Housing Interventions Intervention Not Indicated  Transportation Interventions Intervention Not Indicated  Utilities Interventions Intervention Not Indicated   Medford Balboa, BSN, RN [place]  VBCI - Population Health RN Care Manager [phone]

## 2023-11-02 NOTE — Progress Notes (Signed)
 Reviewed PDMP substance reporting system prior to prescribing opiate medications. No inconsistencies noted.   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  Dispense: 90 tablet; Refill: 0  2. Chronic, continuous use of opioids  - oxyCODONE -acetaminophen  (PERCOCET) 10-325 MG tablet; Take 1 tablet by mouth every 4 (four) hours as needed for pain.  Dispense: 90 tablet; Refill: 0

## 2023-11-03 ENCOUNTER — Ambulatory Visit (INDEPENDENT_AMBULATORY_CARE_PROVIDER_SITE_OTHER): Payer: Self-pay | Admitting: Nurse Practitioner

## 2023-11-03 ENCOUNTER — Encounter: Payer: Self-pay | Admitting: Nurse Practitioner

## 2023-11-03 ENCOUNTER — Other Ambulatory Visit (HOSPITAL_COMMUNITY): Payer: Self-pay

## 2023-11-03 VITALS — BP 102/55 | HR 96 | Temp 97.5°F | Wt 139.0 lb

## 2023-11-03 DIAGNOSIS — E559 Vitamin D deficiency, unspecified: Secondary | ICD-10-CM

## 2023-11-03 DIAGNOSIS — D571 Sickle-cell disease without crisis: Secondary | ICD-10-CM

## 2023-11-03 DIAGNOSIS — Z21 Asymptomatic human immunodeficiency virus [HIV] infection status: Secondary | ICD-10-CM

## 2023-11-03 NOTE — Assessment & Plan Note (Signed)
 Last vitamin D  Lab Results  Component Value Date   VD25OH 17.2 (L) 01/12/2023  Not on vitamin D  supplement Checking labs

## 2023-11-03 NOTE — Progress Notes (Signed)
 Established Patient Office Visit  Subjective:  Patient ID: Martin [name], male    DOB: 22-Oct-1990  Age: 33 y.o. MRN: [id number]  CC:  Chief Complaint  Patient presents with   Sickle Cell Anemia    HPI Martin [name] is a 33 y.o. male   has a past medical history of Anxiety, HIV (human immunodeficiency virus infection) (HCC), Proteinuria, 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HCC), and Vitamin D  deficiency (10/2018).  Patient seen for follow-up for his chronic medical conditions  Sickle cell disease.  He has had multiple hospitalization in ED visits for sickle cell crisis since his last visit, takes hydroxyurea  2000 milligram daily, folic acid  400 mcg daily, for chronic pain takes Percocet 10-325 mg 1 tablet every 4 hours as needed.  His sickle cell pain is currently rated 4/10 and it is mainly in his lower back, Percocet was last taken this morning.  Patient currently denies fever, chills, chest pain, shortness of breath, abdominal pain, nausea, vomiting  Iron overload.  Has Ferriprox  ordered but not taking the medication, will check labs today, not sure why he is not taking the medication  History of PE.  On Eliquis  5 mg twice daily  HIV.  Goes to a free clinic in [place]  every 6 months, currently on Triumeq .  Stated that the infectious disease office at [facility] does not accept his insurance.  Has [place]  Medicaid, currently lives in [place] but does not want to apply for in [place]  Medicaid at this time.         Past Medical History:  Diagnosis Date   Anxiety    HIV (human immunodeficiency virus infection) (HCC)    Proteinuria    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HCC)    Vitamin D  deficiency 10/2018    Past Surgical History:  Procedure Laterality Date   IR IMAGING GUIDED PORT INSERTION  08/29/2019   IR REMOVAL TUN ACCESS W/ PORT W/O FL MOD SED  08/30/2020   TEE WITHOUT CARDIOVERSION N/A 08/30/2020   Procedure: TRANSESOPHAGEAL ECHOCARDIOGRAM (TEE);  Surgeon: Mona Vinie BROCKS, MD;   Location: [facility] ENDOSCOPY;  Service: Cardiovascular;  Laterality: N/A;    Family History  Problem Relation Age of Onset   Sickle cell trait Mother    Sickle cell trait Father    Birth defects Maternal Grandmother    Birth defects Paternal Grandmother     Social History   Socioeconomic History   Marital status: Single    Spouse name: Not on file   Number of children: Not on file   Years of education: Not on file   Highest education level: 12th grade  Occupational History   Occupation: disabled  Tobacco Use   Smoking status: Never   Smokeless tobacco: Never  Vaping Use   Vaping status: Never Used  Substance and Sexual Activity   Alcohol  use: No   Drug use: Yes    Types: Marijuana    Comment: 2x week   Sexual activity: Yes    Birth control/protection: Condom  Other Topics Concern   Not on file  Social History Narrative   Lives with his siblings.    Social Drivers of Corporate investment banker Strain: Low Risk  (10/09/2023)   Received from [facility]   Overall Financial Resource Strain (CARDIA)    Difficulty of Paying Living Expenses: Not very hard  Recent Concern: Financial Resource Strain - Medium Risk (08/03/2023)   Overall Financial Resource Strain (CARDIA)    Difficulty of Paying Living Expenses: Somewhat hard  Food Insecurity: No Food Insecurity (11/02/2023)   Hunger Vital Sign    Worried About Running Out of Food in the Last Year: Never true    Ran Out of Food in the Last Year: Never true  Transportation Needs: No Transportation Needs (11/02/2023)   PRAPARE - Administrator, Civil Service (Medical): No    Lack of Transportation (Non-Medical): No  Recent Concern: Transportation Needs - Unmet Transportation Needs (10/12/2023)   PRAPARE - Transportation    Lack of Transportation (Medical): Yes    Lack of Transportation (Non-Medical): Yes  Physical Activity: Sufficiently Active (10/09/2023)   Received from [facility]   Exercise Vital Sign     On average, how many days per week do you engage in moderate to strenuous exercise (like a brisk walk)?: 7 days    On average, how many minutes do you engage in exercise at this level?: 60 min  Stress: No Stress Concern Present (10/09/2023)   Received from [facility] - Occupational Stress Questionnaire    Feeling of Stress : Not at all  Social Connections: Socially Isolated (10/09/2023)   Received from [facility]   Social Connection and Isolation Panel    In a typical week, how many times do you talk on the phone with family, friends, or neighbors?: More than three times a week    How often do you get together with friends or relatives?: More than three times a week    How often do you attend church or religious services?: Never    Do you belong to any clubs or organizations such as church groups, unions, fraternal or athletic groups, or school groups?: No    How often do you attend meetings of the clubs or organizations you belong to?: Never    Are you married, widowed, divorced, separated, never married, or living with a partner?: Never married  Intimate Partner Violence: Not At Risk (11/02/2023)   Humiliation, Afraid, Rape, and Kick questionnaire    Fear of Current or Ex-Partner: No    Emotionally Abused: No    Physically Abused: No    Sexually Abused: No    Outpatient Medications Prior to Visit  Medication Sig Dispense Refill   abacavir -dolutegravir -lamiVUDine  (TRIUMEQ ) 600-50-300 MG tablet Take 1 tablet by mouth daily. 30 tablet 3   apixaban  (ELIQUIS ) 5 MG TABS tablet Take 1 tablet (5 mg total) by mouth 2 (two) times daily. 60 tablet 11   cyclobenzaprine  (FLEXERIL ) 10 MG tablet Take 10 mg by mouth 2 (two) times daily as needed for muscle spasms.     diphenhydrAMINE  (BENADRYL ) 25 MG tablet Take 1 tablet (25 mg total) by mouth every 4 (four) hours as needed for itching. 30 tablet 1   folic acid  (FOLVITE ) 400 MCG tablet Take 1 tablet  (400 mcg total) by mouth daily. 30 tablet 5   gabapentin  (NEURONTIN ) 300 MG capsule Take 1 capsule (300 mg total) by mouth at bedtime as needed (nerve pain). 30 capsule 3   hydroxyurea  (HYDREA ) 500 MG capsule Take 4 capsules (2,000 mg total) by mouth at bedtime. 120 capsule 11   NARCAN  4 MG/0.1ML LIQD nasal spray kit Place 1 spray into the nose as needed (respiratory distress / opioid overdose).     oxyCODONE -acetaminophen  (PERCOCET) 10-325 MG tablet Take 1 tablet by mouth every 4 (four) hours as needed for pain. 90 tablet 0   albuterol  (VENTOLIN  HFA) 108 (90 Base) MCG/ACT inhaler Inhale 2 puffs into  the lungs every 6 (six) hours as needed for wheezing or shortness of breath. (Patient not taking: Reported on 11/03/2023) 6.7 g 2   Deferiprone  (FERRIPROX ) 500 MG TABS Take 1 tablet by mouth daily. (Patient not taking: Reported on 11/03/2023) 270 tablet 1   guaiFENesin  (MUCINEX ) 600 MG 12 hr tablet Take 1 tablet (600 mg total) by mouth 2 (two) times daily. (Patient not taking: Reported on 11/03/2023) 60 tablet 2   pantoprazole  (PROTONIX ) 40 MG tablet Take 1 tablet (40 mg total) by mouth daily as needed (acid reflux). (Patient not taking: Reported on 11/03/2023) 30 tablet 2   senna-docusate (SENOKOT-S) 8.6-50 MG tablet Take 2 tablets by mouth at bedtime. (Patient not taking: Reported on 11/03/2023) 60 tablet 3   triamcinolone  cream (KENALOG ) 0.1 % Apply 1 Application topically 2 (two) times daily. (Patient not taking: Reported on 11/03/2023) 30 g 2   No facility-administered medications prior to visit.    Allergies  Allergen Reactions   Ketorolac  Tromethamine  Swelling and Other (See Comments)    Patient reports facial edema and left arm edema after administration.   Tape Rash and Other (See Comments)    PLEASE DO NOT USE THE CLEAR, THICK, PLASTIC TAPE- only paper tape is tolerated    Wound Dressing Adhesive Rash    ROS Review of Systems  Constitutional:  Negative for appetite change, chills, fatigue  and fever.  HENT:  Negative for congestion, postnasal drip, rhinorrhea and sneezing.   Respiratory:  Negative for cough, shortness of breath and wheezing.   Cardiovascular:  Negative for chest pain, palpitations and leg swelling.  Gastrointestinal:  Negative for abdominal pain, constipation, nausea and vomiting.  Genitourinary:  Negative for difficulty urinating, dysuria, flank pain and frequency.  Musculoskeletal:  Positive for arthralgias and back pain. Negative for joint swelling and myalgias.  Skin:  Negative for color change and pallor.  Neurological:  Negative for dizziness, facial asymmetry, weakness, numbness and headaches.  Psychiatric/Behavioral:  Negative for behavioral problems, confusion, self-injury and suicidal ideas.       Objective:    Physical Exam Vitals and nursing note reviewed.  Constitutional:      General: He is not in acute distress.    Appearance: Normal appearance. He is not ill-appearing.  Eyes:     General: Scleral icterus present.        Right eye: No discharge.        Left eye: No discharge.     Extraocular Movements: Extraocular movements intact.  Cardiovascular:     Rate and Rhythm: Normal rate and regular rhythm.     Pulses: Normal pulses.     Heart sounds: Normal heart sounds. No murmur heard.    No friction rub. No gallop.  Pulmonary:     Effort: Pulmonary effort is normal. No respiratory distress.     Breath sounds: Normal breath sounds. No stridor. No wheezing, rhonchi or rales.  Chest:     Chest wall: No tenderness.  Abdominal:     General: There is no distension.     Palpations: Abdomen is soft.     Tenderness: There is no abdominal tenderness. There is no right CVA tenderness, left CVA tenderness or guarding.  Musculoskeletal:        General: No swelling, tenderness, deformity or signs of injury.     Right lower leg: No edema.     Left lower leg: No edema.  Skin:    General: Skin is warm and dry.     Capillary  Refill: Capillary  refill takes less than 2 seconds.     Coloration: Skin is not jaundiced or pale.     Findings: No bruising, erythema or lesion.  Neurological:     Mental Status: He is alert and oriented to person, place, and time.     Motor: No weakness.     Coordination: Coordination normal.     Gait: Gait normal.  Psychiatric:        Mood and Affect: Mood normal.        Behavior: Behavior normal.        Thought Content: Thought content normal.        Judgment: Judgment normal.     BP (!) 102/55   Pulse 96   Temp (!) 97.5 F (36.4 C)   Wt 139 lb (63 kg)   SpO2 92%   BMI 17.37 kg/m  Wt Readings from Last 3 Encounters:  11/03/23 139 lb (63 kg)  10/28/23 141 lb 15.6 oz (64.4 kg)  10/12/23 142 lb (64.4 kg)    No results found for: TSH Lab Results  Component Value Date   WBC 10.7 (H) 11/02/2023   HGB 7.7 (L) 11/02/2023   HCT 21.2 (L) 11/02/2023   MCV 107.1 (H) 11/02/2023   PLT 267 11/02/2023   Lab Results  Component Value Date   NA 138 11/02/2023   K 3.8 11/02/2023   CO2 22 11/02/2023   GLUCOSE 91 11/02/2023   BUN 13 11/02/2023   CREATININE 0.95 11/02/2023   BILITOT 9.5 (H) 11/02/2023   ALKPHOS 57 11/02/2023   AST 35 11/02/2023   ALT 18 11/02/2023   PROT 8.1 11/02/2023   ALBUMIN 3.7 11/02/2023   CALCIUM  8.2 (L) 11/02/2023   ANIONGAP 7 11/02/2023   EGFR 120 01/12/2023   No results found for: CHOL No results found for: HDL No results found for: LDLCALC No results found for: [name] No results found for: CHOLHDL Lab Results  Component Value Date   HGBA1C (L) 05/22/2009    <4.0 (NOTE) The ADA recommends the following therapeutic goal for glycemic control related to Hgb A1c measurement: Goal of therapy: <6.5 Hgb A1c  Reference: American Diabetes Association: Clinical Practice Recommendations 2010, Diabetes Care, 2010, 33: (Suppl  1).      Assessment & Plan:   Problem List Items Addressed This Visit       Other   HIV (human immunodeficiency virus infection)  (HCC)   Continue Triumeq  and maintain close follow-up with infectious disease      Hb-SS disease without crisis (HCC)   Sickle cell disease - Continue Hydrea  2000 mg daily. Folic acid  1 mg daily to prevent aplastic bone marrow crises.  Will check sickle cell panel for absolute neutrophil count and platelets.  We discussed the need for good hydration, monitoring of hydration status, avoidance of heat, cold, stress, and infection triggers.  The patient was reminded of the need to seek medical attention of any symptoms of bleeding, anemia, or infection.    Pulmonary evaluation -has chronic respiratory failure.  Patient referred to pulmonology  Eye - High risk of proliferative retinopathy.  Last eye exam was over a year ago, advised to schedule appointment with his ophthalmologist   Acute and chronic painful episodes -   We discussed that pt is to receive her Schedule II prescriptions only from us . Pt is also aware that the prescription history is available to us  online through the [facility] CSRS. We reminded the patient that all patients receiving Schedule  II narcotics must be seen for follow within the 3 months in the office.   Continue Percocet 10-325 mg 1 tablet every 4 hours as needed for pain. Not interested in seeing an hematologist   Iron overload from chronic transfusion.  Will check ferritin levels.  Has a prescription for Ferriprox  that he does not take, could switch medication to Jadenu  since it does not require frequent lab monitoring   Lab Results  Component Value Date   WBC 10.7 (H) 11/02/2023   HGB 7.7 (L) 11/02/2023   HCT 21.2 (L) 11/02/2023   MCV 107.1 (H) 11/02/2023   PLT 267 11/02/2023           Relevant Orders   ToxAssure Flex 15, Ur   Vitamin D  deficiency - Primary   Last vitamin D  Lab Results  Component Value Date   VD25OH 17.2 (L) 01/12/2023  Not on vitamin D  supplement Checking labs      Relevant Orders   VITAMIN D  25 Hydroxy (Vit-D Deficiency, Fractures)    Iron overload, transfusional   Relevant Orders   Ferritin    No orders of the defined types were placed in this encounter.   Follow-up: Return in about 3 months (around 02/03/2024), or SCD.    Zacari Radick R Elgene Coral, [name]

## 2023-11-03 NOTE — Patient Instructions (Signed)
  At a minimum, adult patients with SCD should complete as a primary series (per CDC Adult Immunization Schedule for patients with additional risk factor of functional asplenia; updated October 15, 2022)  Meningococcal ACWY  -Menveo or MenQuadfi - 2 doses at least 8 weeks apart; then re-vaccinate every 5 years  Meningococcal B -Bexsero - 2 doses at least 4 weeks apart OR -Trumenba - 3 doses at month 0, 1-2, and 6 THEN Booster 1 year later and revaccinate every 2-3 years  Haemophilus Influenzae type B (HIB) -1 dose if not previously received  Pneumococcal -if no pneumococcal vaccines received, administer PCV20 x1 -if patient has received PPSV23 or PCV13 previously, administer PCV20 x1 at least 1 year after PPSV23/PCV13 -if patient has received a single dose of PPSV23 and PCV13, then administer PCV20 at least 5 years after PPSV23/PCV13 -if patient has received a two doses of PPSV23 and one dose of PCV13, then administer PCV20 at least 5 years after PPSV23/PCV13 OR patient may be done with series  We also suggest: Immunization for Hepatitis B (Twinrix at 0, 1 and 6 month intervals. For patients receiving regular blood transfusions, have history of HIV, current or recent drug use, or incarcerated)  Tdap (booster every 10 years, and goal of all pregnant woman) - If no history of primary vaccination series for tetanus, diphtheria, or pertussis: At least 1 dose Tdap followed by 1 dose Td or Tdap at least 4 weeks later, another dose Td or Tdap 6-12 months later, and booster every 10 years. Also 1 dose Tdap during each pregnancy.        It is important that you exercise regularly at least 30 minutes 5 times a week as tolerated  Think about what you will eat, plan ahead. Choose  clean, green, fresh or frozen over canned, processed or packaged foods which are more sugary, salty and fatty. 70 to 75% of food eaten should be vegetables and fruit. Three meals at set times with snacks allowed between  meals, but they must be fruit or vegetables. Aim to eat over a 12 hour period , example 7 am to 7 pm, and STOP after  your last meal of the day. Drink water,generally about 64 ounces per day, no other drink is as healthy. Fruit juice is best enjoyed in a healthy way, by EATING the fruit.  Thanks for choosing [facility] we consider it a privelige to serve you.

## 2023-11-03 NOTE — Assessment & Plan Note (Signed)
 Continue Triumeq  and maintain close follow-up with infectious disease

## 2023-11-03 NOTE — Assessment & Plan Note (Addendum)
 Sickle cell disease - Continue Hydrea  2000 mg daily. Folic acid  1 mg daily to prevent aplastic bone marrow crises.  Will check sickle cell panel for absolute neutrophil count and platelets.  We discussed the need for good hydration, monitoring of hydration status, avoidance of heat, cold, stress, and infection triggers.  The patient was reminded of the need to seek medical attention of any symptoms of bleeding, anemia, or infection.    Pulmonary evaluation -has chronic respiratory failure.  Patient referred to pulmonology  Eye - High risk of proliferative retinopathy.  Last eye exam was over a year ago, advised to schedule appointment with his ophthalmologist   Acute and chronic painful episodes -   We discussed that pt is to receive her Schedule II prescriptions only from us . Pt is also aware that the prescription history is available to us  online through the [facility] CSRS. We reminded the patient that all patients receiving Schedule II narcotics must be seen for follow within the 3 months in the office.   Continue Percocet 10-325 mg 1 tablet every 4 hours as needed for pain. Not interested in seeing an hematologist   Iron overload from chronic transfusion.  Will check ferritin levels.  Has a prescription for Ferriprox  that he does not take, could switch medication to Jadenu  since it does not require frequent lab monitoring   Lab Results  Component Value Date   WBC 10.7 (H) 11/02/2023   HGB 7.7 (L) 11/02/2023   HCT 21.2 (L) 11/02/2023   MCV 107.1 (H) 11/02/2023   PLT 267 11/02/2023

## 2023-11-05 ENCOUNTER — Other Ambulatory Visit: Payer: Self-pay | Admitting: Nurse Practitioner

## 2023-11-05 ENCOUNTER — Other Ambulatory Visit: Payer: Self-pay

## 2023-11-05 ENCOUNTER — Ambulatory Visit: Payer: Self-pay | Admitting: Nurse Practitioner

## 2023-11-05 DIAGNOSIS — Z130 Encounter for screening for diseases of the blood and blood-forming organs and certain disorders involving the immune mechanism: Secondary | ICD-10-CM

## 2023-11-05 DIAGNOSIS — Z13228 Encounter for screening for other metabolic disorders: Secondary | ICD-10-CM

## 2023-11-05 DIAGNOSIS — E559 Vitamin D deficiency, unspecified: Secondary | ICD-10-CM

## 2023-11-05 MED ORDER — VITAMIN D (ERGOCALCIFEROL) 1.25 MG (50000 UNIT) PO CAPS
50000.0000 [IU] | ORAL_CAPSULE | ORAL | 1 refills | Status: DC
  Filled 2023-11-05: qty 12, 84d supply, fill #0

## 2023-11-05 MED ORDER — DEFERASIROX 360 MG PO TABS
720.0000 mg | ORAL_TABLET | Freq: Every day | ORAL | 3 refills | Status: DC
  Filled 2023-11-09: qty 60, 30d supply, fill #0

## 2023-11-06 ENCOUNTER — Other Ambulatory Visit (HOSPITAL_COMMUNITY): Payer: Self-pay

## 2023-11-07 ENCOUNTER — Other Ambulatory Visit: Payer: Self-pay

## 2023-11-07 ENCOUNTER — Encounter (HOSPITAL_COMMUNITY): Payer: Self-pay | Admitting: Emergency Medicine

## 2023-11-07 ENCOUNTER — Other Ambulatory Visit (HOSPITAL_COMMUNITY): Payer: Self-pay

## 2023-11-07 ENCOUNTER — Inpatient Hospital Stay (HOSPITAL_COMMUNITY)
Admission: EM | Admit: 2023-11-07 | Discharge: 2023-11-15 | DRG: 812 | Disposition: A | Discharge status: Home / Self Care | Payer: Self-pay | Attending: Internal Medicine | Admitting: Internal Medicine

## 2023-11-07 ENCOUNTER — Emergency Department (HOSPITAL_COMMUNITY): Payer: Self-pay

## 2023-11-07 DIAGNOSIS — Z9981 Dependence on supplemental oxygen: Secondary | ICD-10-CM

## 2023-11-07 DIAGNOSIS — Z91048 Other nonmedicinal substance allergy status: Secondary | ICD-10-CM

## 2023-11-07 DIAGNOSIS — F419 Anxiety disorder, unspecified: Secondary | ICD-10-CM | POA: Diagnosis present

## 2023-11-07 DIAGNOSIS — J9611 Chronic respiratory failure with hypoxia: Secondary | ICD-10-CM | POA: Diagnosis present

## 2023-11-07 DIAGNOSIS — Z86711 Personal history of pulmonary embolism: Secondary | ICD-10-CM

## 2023-11-07 DIAGNOSIS — Z832 Family history of diseases of the blood and blood-forming organs and certain disorders involving the immune mechanism: Secondary | ICD-10-CM

## 2023-11-07 DIAGNOSIS — Z888 Allergy status to other drugs, medicaments and biological substances status: Secondary | ICD-10-CM

## 2023-11-07 DIAGNOSIS — K219 Gastro-esophageal reflux disease without esophagitis: Secondary | ICD-10-CM | POA: Diagnosis present

## 2023-11-07 DIAGNOSIS — D638 Anemia in other chronic diseases classified elsewhere: Secondary | ICD-10-CM | POA: Diagnosis present

## 2023-11-07 DIAGNOSIS — G894 Chronic pain syndrome: Secondary | ICD-10-CM | POA: Diagnosis present

## 2023-11-07 DIAGNOSIS — F411 Generalized anxiety disorder: Secondary | ICD-10-CM | POA: Diagnosis present

## 2023-11-07 DIAGNOSIS — D5700 Hb-SS disease with crisis, unspecified: Principal | ICD-10-CM | POA: Diagnosis present

## 2023-11-07 DIAGNOSIS — Z21 Asymptomatic human immunodeficiency virus [HIV] infection status: Secondary | ICD-10-CM | POA: Diagnosis present

## 2023-11-07 DIAGNOSIS — D72829 Elevated white blood cell count, unspecified: Secondary | ICD-10-CM | POA: Diagnosis present

## 2023-11-07 DIAGNOSIS — Z7901 Long term (current) use of anticoagulants: Secondary | ICD-10-CM

## 2023-11-07 LAB — CBC WITH DIFFERENTIAL/PLATELET
Abs Immature Granulocytes: 0.09 K/uL — ABNORMAL HIGH (ref 0.00–0.07)
Basophils Absolute: 0.1 K/uL (ref 0.0–0.1)
Basophils Relative: 1 %
Eosinophils Absolute: 0.2 K/uL (ref 0.0–0.5)
Eosinophils Relative: 1 %
HCT: 23.4 % — ABNORMAL LOW (ref 39.0–52.0)
Hemoglobin: 8.2 g/dL — ABNORMAL LOW (ref 13.0–17.0)
Immature Granulocytes: 1 %
Lymphocytes Relative: 36 %
Lymphs Abs: 6.2 K/uL — ABNORMAL HIGH (ref 0.7–4.0)
MCH: 38.3 pg — ABNORMAL HIGH (ref 26.0–34.0)
MCHC: 35 g/dL (ref 30.0–36.0)
MCV: 109.3 fL — ABNORMAL HIGH (ref 80.0–100.0)
Monocytes Absolute: 2 K/uL — ABNORMAL HIGH (ref 0.1–1.0)
Monocytes Relative: 12 %
Neutro Abs: 8.7 K/uL — ABNORMAL HIGH (ref 1.7–7.7)
Neutrophils Relative %: 49 %
Platelets: 352 K/uL (ref 150–400)
RBC: 2.14 MIL/uL — ABNORMAL LOW (ref 4.22–5.81)
RDW: 24.8 % — ABNORMAL HIGH (ref 11.5–15.5)
WBC: 17.2 K/uL — ABNORMAL HIGH (ref 4.0–10.5)
nRBC: 25.6 % — ABNORMAL HIGH (ref 0.0–0.2)

## 2023-11-07 LAB — COMPREHENSIVE METABOLIC PANEL WITH GFR
ALT: 14 U/L (ref 0–44)
AST: 32 U/L (ref 15–41)
Albumin: 3.9 g/dL (ref 3.5–5.0)
Alkaline Phosphatase: 60 U/L (ref 38–126)
Anion gap: 9 (ref 5–15)
BUN: 14 mg/dL (ref 6–20)
CO2: 24 mmol/L (ref 22–32)
Calcium: 9 mg/dL (ref 8.9–10.3)
Chloride: 109 mmol/L (ref 98–111)
Creatinine, Ser: 0.93 mg/dL (ref 0.61–1.24)
GFR, Estimated: >60 mL/min (ref >60)
Glucose, Bld: 91 mg/dL (ref 70–99)
Potassium: 4.1 mmol/L (ref 3.5–5.1)
Sodium: 142 mmol/L (ref 135–145)
Total Bilirubin: 11.1 mg/dL — ABNORMAL HIGH (ref 0.0–1.2)
Total Protein: 7.9 g/dL (ref 6.5–8.1)

## 2023-11-07 LAB — HEPATIC FUNCTION PANEL
ALT: 18 U/L (ref 0–44)
AST: 32 U/L (ref 15–41)
Albumin: 3.8 g/dL (ref 3.5–5.0)
Alkaline Phosphatase: 62 U/L (ref 38–126)
Bilirubin, Direct: 0.3 mg/dL — ABNORMAL HIGH (ref 0.0–0.2)
Indirect Bilirubin: 9.3 mg/dL — ABNORMAL HIGH (ref 0.3–0.9)
Total Bilirubin: 9.6 mg/dL — ABNORMAL HIGH (ref 0.0–1.2)
Total Protein: 7.9 g/dL (ref 6.5–8.1)

## 2023-11-07 LAB — LACTATE DEHYDROGENASE: LDH: 470 U/L — ABNORMAL HIGH (ref 98–192)

## 2023-11-07 LAB — RETICULOCYTES
Immature Retic Fract: 34.3 % — ABNORMAL HIGH (ref 2.3–15.9)
RBC.: 4.2 MIL/uL — ABNORMAL LOW (ref 4.22–5.81)
Retic Count, Absolute: 1107 K/uL — ABNORMAL HIGH (ref 19.0–186.0)
Retic Ct Pct: 26.4 % — ABNORMAL HIGH (ref 0.4–3.1)

## 2023-11-07 MED ORDER — OXYCODONE-ACETAMINOPHEN 5-325 MG PO TABS
1.0000 | ORAL_TABLET | ORAL | Status: DC | PRN
  Administered 2023-11-09 – 2023-11-15 (×23): 1 via ORAL
  Filled 2023-11-07 (×24): qty 1

## 2023-11-07 MED ORDER — SODIUM CHLORIDE 0.45 % IV SOLN: INTRAVENOUS | Status: DC

## 2023-11-07 MED ORDER — HYDROMORPHONE HCL 1 MG/ML IJ SOLN
2.0000 mg | INTRAMUSCULAR | Status: AC
  Administered 2023-11-07: 2 mg via INTRAVENOUS
  Filled 2023-11-07: qty 2

## 2023-11-07 MED ORDER — CYCLOBENZAPRINE HCL 10 MG PO TABS
10.0000 mg | ORAL_TABLET | Freq: Two times a day (BID) | ORAL | Status: DC | PRN
  Administered 2023-11-09 – 2023-11-15 (×7): 10 mg via ORAL
  Filled 2023-11-07 (×8): qty 1

## 2023-11-07 MED ORDER — SENNOSIDES-DOCUSATE SODIUM 8.6-50 MG PO TABS
1.0000 | ORAL_TABLET | Freq: Two times a day (BID) | ORAL | Status: DC
  Administered 2023-11-08 – 2023-11-15 (×9): 1 via ORAL
  Filled 2023-11-07 (×15): qty 1

## 2023-11-07 MED ORDER — SODIUM CHLORIDE 0.9 % IV SOLN
12.5000 mg | Freq: Once | INTRAVENOUS | Status: AC
  Administered 2023-11-07: 12.5 mg via INTRAVENOUS
  Filled 2023-11-07: qty 12.5

## 2023-11-07 MED ORDER — SODIUM CHLORIDE 0.45 % IV SOLN
INTRAVENOUS | Status: AC
  Administered 2023-11-07: 1000 mL via INTRAVENOUS
  Administered 2023-11-08: 75 mL/h via INTRAVENOUS

## 2023-11-07 MED ORDER — NALOXONE HCL 0.4 MG/ML IJ SOLN: 0.4000 mg | INTRAMUSCULAR | Status: DC | PRN

## 2023-11-07 MED ORDER — POLYETHYLENE GLYCOL 3350 17 G PO PACK
17.0000 g | PACK | Freq: Every day | ORAL | Status: DC | PRN
  Administered 2023-11-08 – 2023-11-11 (×2): 17 g via ORAL
  Filled 2023-11-07 (×2): qty 1

## 2023-11-07 MED ORDER — DIPHENHYDRAMINE HCL 25 MG PO CAPS
25.0000 mg | ORAL_CAPSULE | ORAL | Status: DC | PRN
  Administered 2023-11-08: 25 mg via ORAL
  Filled 2023-11-07: qty 1

## 2023-11-07 MED ORDER — DEFERASIROX 360 MG PO TABS: 720.0000 mg | ORAL_TABLET | Freq: Every day | ORAL | Status: DC

## 2023-11-07 MED ORDER — HYDROMORPHONE HCL 1 MG/ML IJ SOLN
2.0000 mg | INTRAMUSCULAR | Status: DC | PRN
  Administered 2023-11-07: 2 mg via INTRAVENOUS
  Filled 2023-11-07: qty 2

## 2023-11-07 MED ORDER — HYDROMORPHONE 1 MG/ML IV SOLN
INTRAVENOUS | Status: DC
  Administered 2023-11-07: 5 mg via INTRAVENOUS
  Administered 2023-11-07: 30 mg via INTRAVENOUS
  Administered 2023-11-08: 7.5 mg via INTRAVENOUS
  Administered 2023-11-08: 4.5 mg via INTRAVENOUS
  Administered 2023-11-08: 5.5 mg via INTRAVENOUS
  Administered 2023-11-08: 7.5 mg via INTRAVENOUS
  Administered 2023-11-08: 5.5 mg via INTRAVENOUS
  Administered 2023-11-08: 3 mg via INTRAVENOUS
  Administered 2023-11-09: 4 mg via INTRAVENOUS
  Administered 2023-11-09: 7 mg via INTRAVENOUS
  Administered 2023-11-09: 2 mg via INTRAVENOUS
  Administered 2023-11-09: 3.5 mg via INTRAVENOUS
  Administered 2023-11-09: 7 mg via INTRAVENOUS
  Administered 2023-11-09: 9 mg via INTRAVENOUS
  Administered 2023-11-10: 4.5 mg via INTRAVENOUS
  Administered 2023-11-10: 30 mg via INTRAVENOUS
  Administered 2023-11-10: 3 mg via INTRAVENOUS
  Administered 2023-11-10: 7.5 mg via INTRAVENOUS
  Administered 2023-11-10: 6 mg via INTRAVENOUS
  Administered 2023-11-10: 7.5 mg via INTRAVENOUS
  Administered 2023-11-10: 30 mg via INTRAVENOUS
  Administered 2023-11-10: 2.5 mg via INTRAVENOUS
  Administered 2023-11-11: 1 mg via INTRAVENOUS
  Administered 2023-11-11: 7 mg via INTRAVENOUS
  Administered 2023-11-11: 30 mg via INTRAVENOUS
  Administered 2023-11-12: 6.5 mg via INTRAVENOUS
  Administered 2023-11-12: 4.5 mg via INTRAVENOUS
  Administered 2023-11-12: 30 mg via INTRAVENOUS
  Administered 2023-11-12: 6 mg via INTRAVENOUS
  Administered 2023-11-12: 4.5 mg via INTRAVENOUS
  Administered 2023-11-12: 3.5 mg via INTRAVENOUS
  Administered 2023-11-12: 1.3 mg via INTRAVENOUS
  Administered 2023-11-13: 3.5 mg via INTRAVENOUS
  Administered 2023-11-13: 10.5 mg via INTRAVENOUS
  Administered 2023-11-13: 30 mg via INTRAVENOUS
  Administered 2023-11-13: 7 mg via INTRAVENOUS
  Administered 2023-11-13: 6.5 mg via INTRAVENOUS
  Administered 2023-11-13: 5.5 mg via INTRAVENOUS
  Administered 2023-11-14: 30 mg via INTRAVENOUS
  Administered 2023-11-14: 2.5 mg via INTRAVENOUS
  Administered 2023-11-14: 10 mg via INTRAVENOUS
  Administered 2023-11-14: 7.5 mg via INTRAVENOUS
  Administered 2023-11-14: 5.7 mg via INTRAVENOUS
  Administered 2023-11-14: 6.5 mg via INTRAVENOUS
  Administered 2023-11-14: 7 mg via INTRAVENOUS
  Administered 2023-11-14: 1.5 mg via INTRAVENOUS
  Administered 2023-11-15: 10.5 mg via INTRAVENOUS
  Administered 2023-11-15: 30 mg via INTRAVENOUS
  Administered 2023-11-15: 9.5 mg via INTRAVENOUS
  Filled 2023-11-07 (×10): qty 30

## 2023-11-07 MED ORDER — FOLIC ACID 400 MCG PO TABS: 400.0000 ug | ORAL_TABLET | Freq: Every day | ORAL | Status: DC

## 2023-11-07 MED ORDER — ONDANSETRON HCL 4 MG/2ML IJ SOLN
4.0000 mg | INTRAMUSCULAR | Status: DC | PRN
  Filled 2023-11-07: qty 2

## 2023-11-07 MED ORDER — FOLIC ACID 1 MG PO TABS
0.5000 mg | ORAL_TABLET | Freq: Every day | ORAL | Status: DC
  Administered 2023-11-07 – 2023-11-15 (×9): 0.5 mg via ORAL
  Filled 2023-11-07 (×9): qty 1

## 2023-11-07 MED ORDER — HYDROMORPHONE HCL 1 MG/ML IJ SOLN
1.0000 mg | Freq: Once | INTRAMUSCULAR | Status: AC
  Administered 2023-11-07: 1 mg via INTRAVENOUS
  Filled 2023-11-07: qty 1

## 2023-11-07 MED ORDER — OXYCODONE HCL 5 MG PO TABS
5.0000 mg | ORAL_TABLET | ORAL | Status: DC | PRN
  Administered 2023-11-09 – 2023-11-15 (×24): 5 mg via ORAL
  Filled 2023-11-07 (×24): qty 1

## 2023-11-07 MED ORDER — APIXABAN 5 MG PO TABS
5.0000 mg | ORAL_TABLET | Freq: Two times a day (BID) | ORAL | Status: DC
  Administered 2023-11-07 – 2023-11-15 (×17): 5 mg via ORAL
  Filled 2023-11-07 (×17): qty 1

## 2023-11-07 MED ORDER — PANTOPRAZOLE SODIUM 40 MG PO TBEC: 40.0000 mg | DELAYED_RELEASE_TABLET | Freq: Every day | ORAL | Status: DC | PRN

## 2023-11-07 MED ORDER — ALBUTEROL SULFATE (2.5 MG/3ML) 0.083% IN NEBU: 2.5000 mg | INHALATION_SOLUTION | Freq: Four times a day (QID) | RESPIRATORY_TRACT | Status: DC | PRN

## 2023-11-07 MED ORDER — ABACAVIR-DOLUTEGRAVIR-LAMIVUD 600-50-300 MG PO TABS
1.0000 | ORAL_TABLET | Freq: Every day | ORAL | Status: DC
  Administered 2023-11-07 – 2023-11-15 (×9): 1 via ORAL
  Filled 2023-11-07 (×9): qty 1

## 2023-11-07 MED ORDER — OXYCODONE-ACETAMINOPHEN 10-325 MG PO TABS: 1.0000 | ORAL_TABLET | ORAL | Status: DC | PRN

## 2023-11-07 MED ORDER — HYDROXYUREA 500 MG PO CAPS
2000.0000 mg | ORAL_CAPSULE | Freq: Every day | ORAL | Status: DC
  Administered 2023-11-07 – 2023-11-14 (×8): 2000 mg via ORAL
  Filled 2023-11-07 (×8): qty 4

## 2023-11-07 MED ORDER — ONDANSETRON HCL 4 MG/2ML IJ SOLN
4.0000 mg | Freq: Four times a day (QID) | INTRAMUSCULAR | Status: DC | PRN
Start: 2023-11-07 — End: 2023-11-15
  Administered 2023-11-15: 4 mg via INTRAVENOUS
  Filled 2023-11-07: qty 2

## 2023-11-07 MED ORDER — SODIUM CHLORIDE 0.9% FLUSH: 9.0000 mL | INTRAVENOUS | Status: DC | PRN

## 2023-11-07 MED ORDER — ALBUTEROL SULFATE HFA 108 (90 BASE) MCG/ACT IN AERS: 2.0000 | INHALATION_SPRAY | Freq: Four times a day (QID) | RESPIRATORY_TRACT | Status: DC | PRN

## 2023-11-07 MED ORDER — GABAPENTIN 300 MG PO CAPS
300.0000 mg | ORAL_CAPSULE | Freq: Every evening | ORAL | Status: DC | PRN
  Administered 2023-11-10: 300 mg via ORAL
  Filled 2023-11-07: qty 1

## 2023-11-07 NOTE — ED Provider Notes (Signed)
 [place] EMERGENCY DEPARTMENT AT [facility] Provider Note   CSN: [id number] Arrival date & time: 11/07/23  0602     Patient presents with: Sickle Cell Pain Crisis   Martin [name] is a 33 y.o. male.   Pt complains of a sickle cell crisis. Pt reports typical pain.  Pt states severe pain in hips and back.  Patient denies any fever no chills patient denies any cough or congestion.  Patient has a past medical history of sickle cell HIV GERD, pulmonary embolism and anemia.  The history is provided by the patient. No language interpreter was used.  Sickle Cell Pain Crisis      Prior to Admission medications   Medication Sig Start Date End Date Taking? Authorizing Provider  abacavir -dolutegravir -lamiVUDine  (TRIUMEQ ) 600-50-300 MG tablet Take 1 tablet by mouth daily. 08/13/23   Pearlean Manus, MD  albuterol  (VENTOLIN  HFA) 108 (90 Base) MCG/ACT inhaler Inhale 2 puffs into the lungs every 6 (six) hours as needed for wheezing or shortness of breath. Patient not taking: Reported on 11/03/2023 08/13/23   Pearlean Manus, MD  apixaban  (ELIQUIS ) 5 MG TABS tablet Take 1 tablet (5 mg total) by mouth 2 (two) times daily. 08/13/23   Pearlean Manus, MD  cyclobenzaprine  (FLEXERIL ) 10 MG tablet Take 10 mg by mouth 2 (two) times daily as needed for muscle spasms. 12/22/22   [provider]  Deferasirox  (JADENU ) 360 MG TABS Take 2 tablets (720 mg total) by mouth daily. 11/05/23   [name], [name], [name]  diphenhydrAMINE  (BENADRYL ) 25 MG tablet Take 1 tablet (25 mg total) by mouth every 4 (four) hours as needed for itching. 08/13/23   Pearlean Manus, MD  folic acid  (FOLVITE ) 400 MCG tablet Take 1 tablet (400 mcg total) by mouth daily. 08/13/23   Pearlean Manus, MD  gabapentin  (NEURONTIN ) 300 MG capsule Take 1 capsule (300 mg total) by mouth at bedtime as needed (nerve pain). 08/13/23   Pearlean Manus, MD  guaiFENesin  (MUCINEX ) 600 MG 12 hr tablet Take 1 tablet (600 mg total) by  mouth 2 (two) times daily. Patient not taking: Reported on 11/03/2023 08/13/23 08/12/24  Pearlean Manus, MD  hydroxyurea  (HYDREA ) 500 MG capsule Take 4 capsules (2,000 mg total) by mouth at bedtime. 08/13/23   Pearlean Manus, MD  NARCAN  4 MG/0.1ML LIQD nasal spray kit Place 1 spray into the nose as needed (respiratory distress / opioid overdose). 01/07/23   [provider]  oxyCODONE -acetaminophen  (PERCOCET) 10-325 MG tablet Take 1 tablet by mouth every 4 (four) hours as needed for pain. 11/03/23   [name], [name], [name]  pantoprazole  (PROTONIX ) 40 MG tablet Take 1 tablet (40 mg total) by mouth daily as needed (acid reflux). Patient not taking: Reported on 11/03/2023 08/13/23   Pearlean Manus, MD  senna-docusate (SENOKOT-S) 8.6-50 MG tablet Take 2 tablets by mouth at bedtime. Patient not taking: Reported on 11/03/2023 08/13/23   Pearlean Manus, MD  triamcinolone  cream (KENALOG ) 0.1 % Apply 1 Application topically 2 (two) times daily. Patient not taking: Reported on 11/03/2023 01/12/23   Tilford Bertram HERO, [name]  Vitamin D , Ergocalciferol , (DRISDOL ) 1.25 MG (50000 UNIT) CAPS capsule Take 1 capsule (50,000 Units total) by mouth every 7 (seven) days. 11/05/23   [name], [name], [name]    Allergies: Ketorolac  tromethamine , Tape, and Wound dressing adhesive    Review of Systems  Musculoskeletal:  Positive for arthralgias and back pain.  All other systems reviewed and are negative.   Updated Vital Signs BP 117/79 (BP  Location: Right Arm)   Pulse (!) 108   Temp 98.5 F (36.9 C)   Resp 18   Ht 6' 3 (1.905 m)   Wt 63 kg   SpO2 (!) 80%   BMI 17.37 kg/m   Physical Exam Vitals and nursing note reviewed.  Constitutional:      Appearance: He is well-developed.  HENT:     Head: Normocephalic.     Mouth/Throat:     Mouth: Mucous membranes are moist.  Cardiovascular:     Rate and Rhythm: Normal rate.  Pulmonary:     Effort: Pulmonary effort is normal.  Abdominal:     General: There  is no distension.  Musculoskeletal:        General: Normal range of motion.     Cervical back: Normal range of motion.  Skin:    General: Skin is warm.  Neurological:     General: No focal deficit present.     Mental Status: He is alert and oriented to person, place, and time.     (all labs ordered are listed, but only abnormal results are displayed) Labs Reviewed  CBC WITH DIFFERENTIAL/PLATELET - Abnormal; Notable for the following components:      Result Value   WBC 17.2 (*)    RBC 2.14 (*)    Hemoglobin 8.2 (*)    HCT 23.4 (*)    MCV 109.3 (*)    MCH 38.3 (*)    RDW 24.8 (*)    nRBC 25.6 (*)    Neutro Abs 8.7 (*)    Lymphs Abs 6.2 (*)    Monocytes Absolute 2.0 (*)    Abs Immature Granulocytes 0.09 (*)    All other components within normal limits  RETICULOCYTES - Abnormal; Notable for the following components:   Retic Ct Pct 26.4 (*)    RBC. 4.20 (*)    Retic Count, Absolute 1,107.0 (*)    Immature Retic Fract 34.3 (*)    All other components within normal limits  COMPREHENSIVE METABOLIC PANEL WITH GFR - Abnormal; Notable for the following components:   Total Bilirubin 11.1 (*)    All other components within normal limits    EKG: None  Radiology: DG Chest Portable 1 View Result Date: 11/07/2023 CLINICAL DATA:  Low O2 EXAM: PORTABLE CHEST - 1 VIEW COMPARISON:  10/27/2023 FINDINGS: Ground-glass opacities and mild interstitial prominence in a predominantly perihilar and bibasilar distribution left greater than right, slightly more conspicuous than on prior exam. Stable right IJ port catheter to the distal SVC. Heart size and mediastinal contours are within normal limits. No effusion. Visualized bones unremarkable. IMPRESSION: Slightly more conspicuous ground-glass opacities and interstitial prominence. Electronically Signed   By: JONETTA Faes M.D.   On: 11/07/2023 10:24     .Critical Care  Performed by: Flint Sonny POUR, PA-C Authorized by: Flint Sonny POUR, PA-C    Critical care provider statement:    Critical care time (minutes):  45   Critical care start time:  11/07/2023 7:00 AM   Critical care end time:  11/07/2023 10:49 AM   Critical care was necessary to treat or prevent imminent or life-threatening deterioration of the following conditions:  Cardiac failure and respiratory failure   Critical care was time spent personally by me on the following activities:  Blood draw for specimens, development of treatment plan with patient or surrogate, discussions with consultants, review of old charts, re-evaluation of patient's condition, pulse oximetry, ordering and review of radiographic studies and ordering and  review of laboratory studies   Care discussed with: admitting provider      Medications Ordered in the ED  0.45 % sodium chloride  infusion ( Intravenous New Bag/Given 11/07/23 0720)  ondansetron  (ZOFRAN ) injection 4 mg (has no administration in time range)  HYDROmorphone  (DILAUDID ) injection 2 mg (2 mg Intravenous Given 11/07/23 0722)  HYDROmorphone  (DILAUDID ) injection 2 mg (2 mg Intravenous Given 11/07/23 0758)  HYDROmorphone  (DILAUDID ) injection 2 mg (2 mg Intravenous Given 11/07/23 0832)  diphenhydrAMINE  (BENADRYL ) 12.5 mg in sodium chloride  0.9 % 50 mL IVPB (0 mg Intravenous Stopped 11/07/23 0753)                                    Medical Decision Making Patient complains of having a sickle cell crisis.  Patient complains of pain in his legs and his back.  Amount and/or Complexity of Data Reviewed Labs: ordered. Decision-making details documented in ED Course.    Details: Labs ordered reviewed and interpreted.  White blood cell count is 17.2.  Hemoglobin is 8.2.  Retake count is 26.4 Radiology: ordered and independent interpretation performed. Decision-making details documented in ED Course.    Details: Chest xray  more prominent ground glass opacities Discussion of management or test interpretation with external provider(s): I discussed pt  with Dr. Jegede who will see for admission   Risk Decision regarding hospitalization. Risk Details: Pt given Iv dilaudid  and benadryl .  Pt reports minimal relief with   Labs ordered reviewed and interpreted.      Final diagnoses:  Sickle cell crisis [facility])    ED Discharge Orders     None          Flint Sonny MARLA DEVONNA 11/07/23 1050    Dasie Faden, MD 11/07/23 1429

## 2023-11-07 NOTE — ED Notes (Signed)
 Pt IP bed time hit 40 mins. Attempted to call report to floor.

## 2023-11-07 NOTE — ED Notes (Signed)
 Pt alert and oriented. Attempting to get an accurate O2. Have tried 3 different fingers as well as ear monitor. We restarted the monitor as well to ensure accurate O2 reading. Pt placed on 6 liters to support but O2 still only 80. Will continue to monitor closely. Pt not complaining of any chest pain or SOB. Pt attempting to sleep. Pt has had 3 rounds of dilaudid  at this time.

## 2023-11-07 NOTE — H&P (Addendum)
 H&P  Patient Demographics:  Martin [name], is a 33 y.o. male  MRN: [id number]   DOB - 20-Apr-1991  Admit Date - 11/07/2023  Outpatient Primary MD for the patient is [name], [name], [name]  Chief Complaint  Patient presents with   Sickle Cell Pain Crisis      HPI:   Martin [name]  is a 33 y.o. male with a medical history significant for sickle cell anemia with history of acute chest syndrome, multiple sickle cell crisis, multiple ED visits and hospital admission, history of PE currently on Eliquis , chronic hypoxic respiratory failure on home oxygen , HIV infection and generalized anxiety disorder who presented to the emergency room today with major complaints of generalized body pain that is consistent with his typical sickle cell pain crisis.  Patient was recently admitted for the same complaints and discharged on 10/29/2023, he has since been to the ED again between then and today.  Patient claims his pain is mostly in his hip joints, lower back and lower extremities.  At present, patient has no shortness of breath, no cough or chest pain.  He endorses adherence with his home medications including Eliquis .  He reports good oral intake.  He has also recently noticed increasing yellowness of his eyes, no blurry vision or double vision.  He denies any fever, headache, dizziness, nausea, vomiting or diarrhea.  No recent travels, and no sick contacts.  ED course: Vital signs showed: BP 117/79 (BP Location: Right Arm)  Pulse (!) 108  Temp 98.5 F (36.9 C)  Resp 18  Ht 6' 3 (1.905 m)  Wt 63 kg  SpO2 (!) 80%  BMI 17.37 kg/m. Patient was in no acute distress, resting comfortably.  Available lab results showed elevated total bilirubin to 11.1, has been trending up for the past 3 weeks, was 4.53 weeks ago and 9.55 days ago, otherwise normal CMP.  CBC showed elevated WBC count at 17.2, hemoglobin 8.2 which is consistent with his baseline and normal platelet count at 352.  He has a robust  reticulocytosis at 26.4%.  Chest x-ray showed more prominent groundglass opacities but no acute infiltrate.   Patient received multiple doses of IV Dilaudid , and other adjunct therapy per Sickle cell management protocol with no significant relief of pain.  Patient will be admitted to the hospital for further evaluation and management of sickle cell pain crisis.   Review of systems:  In addition to the HPI above, patient reports No fever or chills No Headache, No changes with vision or hearing No problems swallowing food or liquids No chest pain, cough or shortness of breath No abdominal pain, No nausea or vomiting, Bowel movements are regular No blood in stool or urine No dysuria No new skin rashes or bruises No new joints pains-aches No new weakness, tingling, numbness in any extremity No recent weight gain or loss No polyuria, polydypsia or polyphagia No significant Mental Stressors  A full 10 point Review of Systems was done, except as stated above, all other Review of Systems were negative.  With Past History of the following :   Past Medical History:  Diagnosis Date   Anxiety    HIV (human immunodeficiency virus infection) (HCC)    Proteinuria    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HCC)    Vitamin D  deficiency 10/2018      Past Surgical History:  Procedure Laterality Date   IR IMAGING GUIDED PORT INSERTION  08/29/2019   IR REMOVAL TUN ACCESS W/ PORT W/O FL MOD SED  08/30/2020   TEE WITHOUT CARDIOVERSION N/A 08/30/2020   Procedure: TRANSESOPHAGEAL ECHOCARDIOGRAM (TEE);  Surgeon: Mona Vinie BROCKS, MD;  Location[facility] ENDOSCOPY;  Service: Cardiovascular;  Laterality: N/A;     Social History:   Social History   Tobacco Use   Smoking status: Never   Smokeless tobacco: Never  Substance Use Topics   Alcohol  use: No     Lives - At home   Family History :   Family History  Problem Relation Age of Onset   Sickle cell trait Mother    Sickle cell trait Father    Birth defects  Maternal Grandmother    Birth defects Paternal Grandmother      Home Medications:   Prior to Admission medications   Medication Sig Start Date End Date Taking? Authorizing Provider  abacavir -dolutegravir -lamiVUDine  (TRIUMEQ ) 600-50-300 MG tablet Take 1 tablet by mouth daily. 08/13/23   Pearlean Manus, MD  albuterol  (VENTOLIN  HFA) 108 (90 Base) MCG/ACT inhaler Inhale 2 puffs into the lungs every 6 (six) hours as needed for wheezing or shortness of breath. Patient not taking: Reported on 11/03/2023 08/13/23   Pearlean Manus, MD  apixaban  (ELIQUIS ) 5 MG TABS tablet Take 1 tablet (5 mg total) by mouth 2 (two) times daily. 08/13/23   Pearlean Manus, MD  cyclobenzaprine  (FLEXERIL ) 10 MG tablet Take 10 mg by mouth 2 (two) times daily as needed for muscle spasms. 12/22/22   [provider]  Deferasirox  (JADENU ) 360 MG TABS Take 2 tablets (720 mg total) by mouth daily. 11/05/23   [name], [name], [name]  diphenhydrAMINE  (BENADRYL ) 25 MG tablet Take 1 tablet (25 mg total) by mouth every 4 (four) hours as needed for itching. 08/13/23   Pearlean Manus, MD  folic acid  (FOLVITE ) 400 MCG tablet Take 1 tablet (400 mcg total) by mouth daily. 08/13/23   Pearlean Manus, MD  gabapentin  (NEURONTIN ) 300 MG capsule Take 1 capsule (300 mg total) by mouth at bedtime as needed (nerve pain). 08/13/23   Pearlean Manus, MD  guaiFENesin  (MUCINEX ) 600 MG 12 hr tablet Take 1 tablet (600 mg total) by mouth 2 (two) times daily. Patient not taking: Reported on 11/03/2023 08/13/23 08/12/24  Pearlean Manus, MD  hydroxyurea  (HYDREA ) 500 MG capsule Take 4 capsules (2,000 mg total) by mouth at bedtime. 08/13/23   Pearlean Manus, MD  NARCAN  4 MG/0.1ML LIQD nasal spray kit Place 1 spray into the nose as needed (respiratory distress / opioid overdose). 01/07/23   [provider]  oxyCODONE -acetaminophen  (PERCOCET) 10-325 MG tablet Take 1 tablet by mouth every 4 (four) hours as needed for pain. 11/03/23   [name],  [name], [name]  pantoprazole  (PROTONIX ) 40 MG tablet Take 1 tablet (40 mg total) by mouth daily as needed (acid reflux). Patient not taking: Reported on 11/03/2023 08/13/23   Pearlean Manus, MD  senna-docusate (SENOKOT-S) 8.6-50 MG tablet Take 2 tablets by mouth at bedtime. Patient not taking: Reported on 11/03/2023 08/13/23   Pearlean Manus, MD  triamcinolone  cream (KENALOG ) 0.1 % Apply 1 Application topically 2 (two) times daily. Patient not taking: Reported on 11/03/2023 01/12/23   Tilford Bertram HERO, [name]  Vitamin D , Ergocalciferol , (DRISDOL ) 1.25 MG (50000 UNIT) CAPS capsule Take 1 capsule (50,000 Units total) by mouth every 7 (seven) days. 11/05/23   [name], [name], [name]     Allergies:   Allergies  Allergen Reactions   Ketorolac  Tromethamine  Swelling and Other (See Comments)    Patient reports facial edema and left arm edema after administration.  Tape Rash and Other (See Comments)    PLEASE DO NOT USE THE CLEAR, THICK, PLASTIC TAPE- only paper tape is tolerated    Wound Dressing Adhesive Rash     Physical Exam:   Vitals:   Vitals:   11/07/23 1043 11/07/23 1045  BP: 106/75 106/75  Pulse: 89 (!) 101  Resp: 18 18  Temp:    SpO2: 93% 90%    Physical Exam: Constitutional: Patient appears well-developed and well-nourished. Not in obvious distress. Icterus+++, afebrile. HENT: Normocephalic, atraumatic, External right and left ear normal. Oropharynx is clear and moist.  Eyes: Conjunctivae and EOM are normal. PERRLA, +++ scleral icterus. Neck: Normal ROM. Neck supple. No JVD. No tracheal deviation. No thyromegaly. CVS: RRR, S1/S2 +, no murmurs, no gallops, no carotid bruit.  Pulmonary: Effort and breath sounds normal, no stridor, rhonchi, wheezes, rales.  Abdominal: Soft. BS +, no distension, tenderness, rebound or guarding.  Musculoskeletal: Normal range of motion. No edema and no tenderness.  Lymphadenopathy: No lymphadenopathy noted, cervical, inguinal or  axillary Neuro: Alert. Normal reflexes, muscle tone coordination. No cranial nerve deficit. Skin: Skin is warm and dry. No rash noted. Not diaphoretic. No erythema. No pallor. Psychiatric: Normal mood and affect. Behavior, judgment, thought content normal.   Data Review:   CBC Recent Labs  Lab 11/02/23 0114 11/07/23 0700  WBC 10.7* 17.2*  HGB 7.7* 8.2*  HCT 21.2* 23.4*  PLT 267 352  MCV 107.1* 109.3*  MCH 38.9* 38.3*  MCHC 36.3* 35.0  RDW 26.0* 24.8*  LYMPHSABS 5.1* 6.2*  MONOABS 1.4* 2.0*  EOSABS 0.3 0.2  BASOSABS 0.1 0.1   ------------------------------------------------------------------------------------------------------------------  Chemistries  Recent Labs  Lab 11/02/23 0114 11/07/23 0700  NA 138 142  K 3.8 4.1  CL 109 109  CO2 22 24  GLUCOSE 91 91  BUN 13 14  CREATININE 0.95 0.93  CALCIUM  8.2* 9.0  AST 35 32  ALT 18 14  ALKPHOS 57 60  BILITOT 9.5* 11.1*   ------------------------------------------------------------------------------------------------------------------ estimated creatinine clearance is 100.8 mL/min (by C-G formula based on SCr of 0.93 mg/dL). ------------------------------------------------------------------------------------------------------------------ No results for input(s): TSH, T4TOTAL, T3FREE, THYROIDAB in the last 72 hours.  Invalid input(s): FREET3  Coagulation profile No results for input(s): INR, PROTIME in the last 168 hours. ------------------------------------------------------------------------------------------------------------------- No results for input(s): DDIMER in the last 72 hours. -------------------------------------------------------------------------------------------------------------------  Cardiac Enzymes No results for input(s): CKMB, TROPONINI, MYOGLOBIN in the last 168 hours.  Invalid input(s):  CK ------------------------------------------------------------------------------------------------------------------    Component Value Date/Time   BNP 91.4 05/27/2021 2216    ---------------------------------------------------------------------------------------------------------------  Urinalysis    Component Value Date/Time   COLORURINE AMBER (A) 06/20/2023 0311   APPEARANCEUR CLOUDY (A) 06/20/2023 0311   LABSPEC 1.014 06/20/2023 0311   PHURINE 5.0 06/20/2023 0311   GLUCOSEU NEGATIVE 06/20/2023 0311   HGBUR MODERATE (A) 06/20/2023 0311   BILIRUBINUR NEGATIVE 06/20/2023 0311   BILIRUBINUR small (A) 03/18/2021 1218   BILIRUBINUR neg 11/27/2019 1149   KETONESUR NEGATIVE 06/20/2023 0311   PROTEINUR 100 (A) 06/20/2023 0311   UROBILINOGEN 2.0 (A) 03/18/2021 1218   UROBILINOGEN 4.0 (H) 05/21/2009 1944   NITRITE NEGATIVE 06/20/2023 0311   LEUKOCYTESUR NEGATIVE 06/20/2023 0311    ----------------------------------------------------------------------------------------------------------------   Imaging Results:    DG Chest Portable 1 View Result Date: 11/07/2023 CLINICAL DATA:  Low O2 EXAM: PORTABLE CHEST - 1 VIEW COMPARISON:  10/27/2023 FINDINGS: Ground-glass opacities and mild interstitial prominence in a predominantly perihilar and bibasilar distribution left greater than right, slightly more conspicuous than on prior  exam. Stable right IJ port catheter to the distal SVC. Heart size and mediastinal contours are within normal limits. No effusion. Visualized bones unremarkable. IMPRESSION: Slightly more conspicuous ground-glass opacities and interstitial prominence. Electronically Signed   By: JONETTA Faes M.D.   On: 11/07/2023 10:24     Assessment & Plan:  Principal Problem:   Sickle cell anemia with crisis (HCC) Active Problems:   Generalized anxiety disorder   GERD (gastroesophageal reflux disease)   HIV (human immunodeficiency virus infection) (HCC)   History of pulmonary  embolism   Anxiety   Leukocytosis   Anemia of chronic disease   Chronic hypoxic respiratory failure (HCC)  Hb Sickle Cell Disease with crisis: Admit patient, start IVF 0.45% Saline @ 75 mls/hour, start weight based Dilaudid  PCA, Toradol  is contraindicated due to allergy, Restart oral home pain medications, Monitor vitals very closely, Re-evaluate pain scale regularly, 2 L of Oxygen  by Cecilia, Patient will be re-evaluated for pain in the context of function and relationship to baseline as care progresses. Leukocytosis: Most likely due to VOC with demargination.  There is no evidence of infection or inflammation at this time. Will monitor very closely without antibiotics. Repeat labs in AM. Anemia of Chronic Disease: Hemoglobin is 8.2, consistent with patient's baseline. There is no clinical indication for blood transfusion at this time.  Will monitor very closely and transfuse as appropriate. Chronic pain Syndrome: Will restart and continue other home pain medications. Chronic hypoxic respiratory failure: Patient's oxygen  saturations dropped to 80% in the emergency room, but no significant acute findings on chest x-ray, only showed more prominent groundglass opacity.  Give supplemental oxygen  to maintain oxygenation above 92%, and incentive spirometry.  Monitor pulse oxygen  closely. History of PE: Patient on Eliquis .  Continue. HIV infection: Asymptomatic.  Continue home medication. Generalized anxiety disorder: Clinically stable.  Patient denies any suicidal ideations or thoughts.  Will continue his home medications. Hyperbilirubinemia: Patient's total bilirubin has gone up, now 11.1 as against 4.5, 3-weeks ago.  Patient is obviously jaundiced.  Anticipate drop in hemoglobin if patient is actively hemolyzing.  Will order LDH and hepatic function panel.  Review when labs become available.  DVT Prophylaxis: Eliquis   AM Labs Ordered, also please review Full Orders  Family Communication: Admission,  patient's condition and plan of care including tests being ordered have been discussed with the patient who indicate understanding and agree with the plan and Code Status.  Code Status: Full Code  Consults called: None    Admission status: Inpatient    Time spent in minutes : 50 minutes  Brelee Renk  11/07/2023 at 11:04 AM

## 2023-11-07 NOTE — Plan of Care (Signed)
   Problem: Education: Goal: Knowledge of General Education information will improve Description: Including pain rating scale, medication(s)/side effects and non-pharmacologic comfort measures Outcome: Progressing   Problem: Health Behavior/Discharge Planning: Goal: Ability to manage health-related needs will improve Outcome: Progressing   Problem: Clinical Measurements: Goal: Ability to maintain clinical measurements within normal limits will improve Outcome: Progressing Goal: Will remain free from infection Outcome: Progressing Goal: Diagnostic test results will improve Outcome: Progressing Goal: Respiratory complications will improve Outcome: Progressing Goal: Cardiovascular complication will be avoided Outcome: Progressing   Problem: Activity: Goal: Risk for activity intolerance will decrease Outcome: Progressing   Problem: Nutrition: Goal: Adequate nutrition will be maintained Outcome: Progressing   Problem: Elimination: Goal: Will not experience complications related to bowel motility Outcome: Progressing Goal: Will not experience complications related to urinary retention Outcome: Progressing

## 2023-11-07 NOTE — ED Triage Notes (Signed)
 Pt c/o Sickle Cell Pain all over but more to bilateral hips and legs since last night

## 2023-11-08 ENCOUNTER — Inpatient Hospital Stay (HOSPITAL_COMMUNITY): Payer: Self-pay

## 2023-11-08 ENCOUNTER — Other Ambulatory Visit: Payer: Self-pay

## 2023-11-08 DIAGNOSIS — I272 Pulmonary hypertension, unspecified: Secondary | ICD-10-CM

## 2023-11-08 LAB — ECHOCARDIOGRAM COMPLETE
Area-P 1/2: 3.37 cm2
Calc EF: 55.7 %
Height: 75 in
S' Lateral: 3 cm
Single Plane A2C EF: 56.4 %
Single Plane A4C EF: 57.3 %
Weight: 2224 [oz_av]

## 2023-11-08 LAB — CBC
HCT: 21.9 % — ABNORMAL LOW (ref 39.0–52.0)
Hemoglobin: 8.1 g/dL — ABNORMAL LOW (ref 13.0–17.0)
MCH: 40.9 pg — ABNORMAL HIGH (ref 26.0–34.0)
MCHC: 37 g/dL — ABNORMAL HIGH (ref 30.0–36.0)
MCV: 110.6 fL — ABNORMAL HIGH (ref 80.0–100.0)
Platelets: 315 K/uL (ref 150–400)
RBC: 1.98 MIL/uL — ABNORMAL LOW (ref 4.22–5.81)
RDW: 24.6 % — ABNORMAL HIGH (ref 11.5–15.5)
WBC: 10.4 K/uL (ref 4.0–10.5)
nRBC: 30.1 % — ABNORMAL HIGH (ref 0.0–0.2)

## 2023-11-08 MED ORDER — CARMEX CLASSIC LIP BALM EX OINT
TOPICAL_OINTMENT | CUTANEOUS | Status: DC | PRN
  Filled 2023-11-08 (×2): qty 10

## 2023-11-08 MED ORDER — CHLORHEXIDINE GLUCONATE CLOTH 2 % EX PADS
6.0000 | MEDICATED_PAD | Freq: Every day | CUTANEOUS | Status: DC
  Administered 2023-11-08 – 2023-11-14 (×7): 6 via TOPICAL

## 2023-11-08 NOTE — Progress Notes (Signed)
 Patient ID: Martin [name], male   DOB: August 17, 1990, 33 y.o.   MRN: [id number] Subjective: Martin [name]  is a 33 y.o. male with history of sickle cell disease,  chronic pain syndrome, opiate dependency and tolerance, history of HIV, and history of PE on Xarelto.  Patient presents to the emergency department with generalize body pain, lower back and hip pain. He is on 3l oxygen  at home.   Patient is reporting pain of 8/10 today shortness of breath improved with oxygen  via nasal cannula at 2 L.  Saturations at 95%. Denies cough, fever, Nausea/vomiting/diarrhea.    Objective:  Vital signs in last 24 hours:  Vitals:   11/08/23 0300 11/08/23 0414 11/08/23 0803 11/08/23 0941  BP: 119/65   109/64  Pulse: 83   79  Resp: 14 15 12 18   Temp: 98.2 F (36.8 C)   98.4 F (36.9 C)  TempSrc: Oral   Oral  SpO2: 95%  90% 92%  Weight:      Height:        Intake/Output from previous day:   Intake/Output Summary (Last 24 hours) at 11/08/2023 1109 Last data filed at 11/08/2023 0819 Gross per 24 hour  Intake 1561.72 ml  Output 2150 ml  Net -588.28 ml     Physical Exam: General: Alert, awake, oriented x3, in no acute distress.  HEENT: [place]/AT PEERL, EOMI Neck: Trachea midline,  no masses, no thyromegal,y no JVD, no carotid bruit OROPHARYNX:  Moist, No exudate/ erythema/lesions.  Heart: Regular rate and rhythm, without murmurs, rubs, gallops, PMI non-displaced, no heaves or thrills on palpation.  Lungs: Clear to auscultation, no wheezing or rhonchi noted. No increased vocal fremitus resonant to percussion  Abdomen: Soft, nontender, nondistended, positive bowel sounds, no masses no hepatosplenomegaly noted..  Neuro: No focal neurological deficits noted cranial nerves II through XII grossly intact. DTRs 2+ bilaterally upper and lower extremities. Strength 5 out of 5 in bilateral upper and lower extremities. Musculoskeletal: Generalized body tenderness. Hip and back tenderness Psychiatric:  Patient alert and oriented x3, good insight and cognition, good recent to remote recall. Lymph node survey: No cervical axillary or inguinal lymphadenopathy noted.  Lab Results:  Basic Metabolic Panel:    Component Value Date/Time   NA 142 11/07/2023 0700   NA 140 01/12/2023 1607   K 4.1 11/07/2023 0700   CL 109 11/07/2023 0700   CO2 24 11/07/2023 0700   BUN 14 11/07/2023 0700   BUN 8 01/12/2023 1607   CREATININE 0.93 11/07/2023 0700   GLUCOSE 91 11/07/2023 0700   CALCIUM  9.0 11/07/2023 0700   CBC:    Component Value Date/Time   WBC 10.4 11/08/2023 0510   HGB 8.1 (L) 11/08/2023 0510   HGB 7.4 (L) 01/12/2023 1607   HCT 21.9 (L) 11/08/2023 0510   HCT 21.7 (L) 01/12/2023 1607   PLT 315 11/08/2023 0510   PLT 321 01/12/2023 1607   MCV 110.6 (H) 11/08/2023 0510   MCV 94 01/12/2023 1607   NEUTROABS 8.7 (H) 11/07/2023 0700   NEUTROABS 3.8 01/12/2023 1607   LYMPHSABS 6.2 (H) 11/07/2023 0700   LYMPHSABS 5.6 (H) 01/12/2023 1607   MONOABS 2.0 (H) 11/07/2023 0700   EOSABS 0.2 11/07/2023 0700   EOSABS 0.2 01/12/2023 1607   BASOSABS 0.1 11/07/2023 0700   BASOSABS 0.1 01/12/2023 1607    No results found for this or any previous visit (from the past 240 hours).   Studies/Results: DG Chest Portable 1 View Result Date: 11/07/2023 CLINICAL DATA:  Low  O2 EXAM: PORTABLE CHEST - 1 VIEW COMPARISON:  10/27/2023 FINDINGS: Ground-glass opacities and mild interstitial prominence in a predominantly perihilar and bibasilar distribution left greater than right, slightly more conspicuous than on prior exam. Stable right IJ port catheter to the distal SVC. Heart size and mediastinal contours are within normal limits. No effusion. Visualized bones unremarkable. IMPRESSION: Slightly more conspicuous ground-glass opacities and interstitial prominence. Electronically Signed   By: JONETTA Faes M.D.   On: 11/07/2023 10:24     Medications: Scheduled Meds:  abacavir -dolutegravir -lamiVUDine   1 tablet Oral  Daily   apixaban   5 mg Oral BID   folic acid   0.5 mg Oral Daily   HYDROmorphone    Intravenous Q4H   hydroxyurea   2,000 mg Oral QHS   senna-docusate  1 tablet Oral BID   Continuous Infusions:  sodium chloride  75 mL/hr (11/08/23 0417)    PRN Meds:.albuterol , cyclobenzaprine , diphenhydrAMINE , gabapentin , HYDROmorphone  (DILAUDID ) injection, naloxone  **AND** sodium chloride  flush, ondansetron  (ZOFRAN ) IV, oxyCODONE -acetaminophen  **AND** oxyCODONE , pantoprazole , polyethylene glycol  Consultants: None  Procedures: None  Antibiotics: None  Assessment/Plan: Principal Problem:   Sickle cell anemia with crisis (HCC) Active Problems:   Generalized anxiety disorder   GERD (gastroesophageal reflux disease)   HIV (human immunodeficiency virus infection) (HCC)   History of pulmonary embolism   Anxiety   Leukocytosis   Anemia of chronic disease   Chronic hypoxic respiratory failure (HCC)   Hb Sickle Cell Disease with Pain crisis: Continue IVF 0.45% Saline @KVO , continue weight based Dilaudid  PCA, continue oral home pain medications as ordered. Monitor vitals very closely, Re-evaluate pain scale regularly, 2 L of Oxygen  by [place]. Patient encouraged to ambulate on the hallway today.  Leukocytosis: Stable Anemia of Chronic Disease: CBC 8.1 g/dL slightly lower than patient's baseline.  No need for transfusion at this time, will continue to monitor daily CBC. Chronic pain Syndrome: Continue oral home pain medication. Anxiety: Stable, continue medication as prescribed. Chronic hypoxic respiratory failure: Stable, O2 saturation at 95%, continue 2 L oxygen  via nasal cannula. History of pulmonary embolism: Continue Eliquis  as prescribed. History of HIV infection: Continue Abacavir -dolutegravir -lamiVUDine  (TRIUMEQ ) as prescribed.  Code Status: Full Code Family Communication: N/A Disposition Plan: Not yet ready for discharge  [name] Cover NP If 7PM-7AM, please contact  night-coverage.  11/08/2023, 11:09 AM  LOS: 1 day

## 2023-11-08 NOTE — Progress Notes (Signed)
  Echocardiogram 2D Echocardiogram has been performed.  Martin [name] 11/08/2023, 9:40 AM

## 2023-11-09 ENCOUNTER — Other Ambulatory Visit (HOSPITAL_COMMUNITY): Payer: Self-pay

## 2023-11-09 ENCOUNTER — Other Ambulatory Visit: Payer: Self-pay

## 2023-11-09 ENCOUNTER — Other Ambulatory Visit (HOSPITAL_BASED_OUTPATIENT_CLINIC_OR_DEPARTMENT_OTHER): Payer: Self-pay

## 2023-11-09 LAB — OPIATE CLASS, MS, UR RFX
Codeine: NOT DETECTED ng/mg{creat}
Dihydrocodeine: NOT DETECTED ng/mg{creat}
Hydrocodone: NOT DETECTED ng/mg{creat}
Hydromorphone: 278 ng/mg{creat}
Morphine: NOT DETECTED ng/mg{creat}
Norcodeine: NOT DETECTED ng/mg{creat}
Norhydrocodone: NOT DETECTED ng/mg{creat}
Normorphine: NOT DETECTED ng/mg{creat}
Opiate Class Confirmation: POSITIVE

## 2023-11-09 LAB — CBC
HCT: 21.4 % — ABNORMAL LOW (ref 39.0–52.0)
Hemoglobin: 7.9 g/dL — ABNORMAL LOW (ref 13.0–17.0)
MCH: 39.3 pg — ABNORMAL HIGH (ref 26.0–34.0)
MCHC: 36.9 g/dL — ABNORMAL HIGH (ref 30.0–36.0)
MCV: 106.5 fL — ABNORMAL HIGH (ref 80.0–100.0)
Platelets: 320 K/uL (ref 150–400)
RBC: 2.01 MIL/uL — ABNORMAL LOW (ref 4.22–5.81)
RDW: 21.5 % — ABNORMAL HIGH (ref 11.5–15.5)
WBC: 9.7 K/uL (ref 4.0–10.5)
nRBC: 16.1 % — ABNORMAL HIGH (ref 0.0–0.2)

## 2023-11-09 LAB — VITAMIN D 25 HYDROXY (VIT D DEFICIENCY, FRACTURES): Vit D, 25-Hydroxy: 9.7 ng/mL — ABNORMAL LOW (ref 30.0–100.0)

## 2023-11-09 LAB — TOXASSURE FLEX 15, UR

## 2023-11-09 LAB — OXYCODONE CLASS, MS, UR RFX
Noroxycodone: 1690 ng/mg{creat}
Noroxymorphone: 591 ng/mg{creat}
Oxycodone Class Confirmation: POSITIVE
Oxycodone: 507 ng/mg{creat}
Oxymorphone: 1486 ng/mg{creat}

## 2023-11-09 LAB — CANNABINOIDS, MS, UR RFX
Cannabinoids Confirmation: POSITIVE
Carboxy-THC: 6 ng/mg{creat}

## 2023-11-09 LAB — FERRITIN: Ferritin: 2585 ng/mL — ABNORMAL HIGH (ref 30–400)

## 2023-11-09 MED ORDER — GLYCERIN (LAXATIVE) 2.1 G RE SUPP
1.0000 | Freq: Once | RECTAL | Status: AC
  Administered 2023-11-09: 1 via RECTAL
  Filled 2023-11-09: qty 1

## 2023-11-09 MED ORDER — VITAMIN D (ERGOCALCIFEROL) 1.25 MG (50000 UNIT) PO CAPS: 50000.0000 [IU] | ORAL_CAPSULE | ORAL | 2 refills | Status: DC

## 2023-11-09 NOTE — Progress Notes (Signed)
 Patient ID: Martin [name], male   DOB: 1990-07-20, 32 y.o.   MRN: [id number] Subjective: Martin [name]  is a 33 y.o. male with history of sickle cell disease,  chronic pain syndrome, opiate dependency and tolerance, history of HIV, and history of PE on Xarelto.  Patient presents to the emergency department with generalize body pain, lower back and hip pain. He is on 3l oxygen  at home.   Patient is reporting ongoing pain of 8/10 today, patient is not utilizing his short acting oral pain medication as needed. Encourage patient to ask for PRN Oxycodone  as prescribed.  Denies cough, fever, Nausea/vomiting/diarrhea.  No urinary symptoms.   Objective:  Vital signs in last 24 hours:  Vitals:   11/09/23 0642 11/09/23 0852 11/09/23 0932 11/09/23 1145  BP: 110/75  94/60   Pulse: 90  85   Resp: 18 12 18 16   Temp: 98.2 F (36.8 C)  98.5 F (36.9 C)   TempSrc: Oral  Oral   SpO2: 97% 96% 91%   Weight:      Height:        Intake/Output from previous day:   Intake/Output Summary (Last 24 hours) at 11/09/2023 1326 Last data filed at 11/09/2023 1000 Gross per 24 hour  Intake 1461.66 ml  Output 1850 ml  Net -388.34 ml     Physical Exam: General: Alert, awake, oriented x3, in no acute distress.  HEENT: [place]/AT PEERL, EOMI Neck: Trachea midline,  no masses, no thyromegal,y no JVD, no carotid bruit OROPHARYNX:  Moist, No exudate/ erythema/lesions.  Heart: Regular rate and rhythm, without murmurs, rubs, gallops, PMI non-displaced, no heaves or thrills on palpation.  Lungs: Clear to auscultation, no wheezing or rhonchi noted. No increased vocal fremitus resonant to percussion  Abdomen: Soft, nontender, nondistended, positive bowel sounds, no masses no hepatosplenomegaly noted..  Neuro: No focal neurological deficits noted cranial nerves II through XII grossly intact. DTRs 2+ bilaterally upper and lower extremities. Strength 5 out of 5 in bilateral upper and lower  extremities. Musculoskeletal: Generalized body tenderness. Hip and back tenderness Psychiatric: Patient alert and oriented x3, good insight and cognition, good recent to remote recall. Lymph node survey: No cervical axillary or inguinal lymphadenopathy noted.  Lab Results:  Basic Metabolic Panel:    Component Value Date/Time   NA 142 11/07/2023 0700   NA 140 01/12/2023 1607   K 4.1 11/07/2023 0700   CL 109 11/07/2023 0700   CO2 24 11/07/2023 0700   BUN 14 11/07/2023 0700   BUN 8 01/12/2023 1607   CREATININE 0.93 11/07/2023 0700   GLUCOSE 91 11/07/2023 0700   CALCIUM  9.0 11/07/2023 0700   CBC:    Component Value Date/Time   WBC 9.7 11/09/2023 0448   HGB 7.9 (L) 11/09/2023 0448   HGB 7.4 (L) 01/12/2023 1607   HCT 21.4 (L) 11/09/2023 0448   HCT 21.7 (L) 01/12/2023 1607   PLT 320 11/09/2023 0448   PLT 321 01/12/2023 1607   MCV 106.5 (H) 11/09/2023 0448   MCV 94 01/12/2023 1607   NEUTROABS 8.7 (H) 11/07/2023 0700   NEUTROABS 3.8 01/12/2023 1607   LYMPHSABS 6.2 (H) 11/07/2023 0700   LYMPHSABS 5.6 (H) 01/12/2023 1607   MONOABS 2.0 (H) 11/07/2023 0700   EOSABS 0.2 11/07/2023 0700   EOSABS 0.2 01/12/2023 1607   BASOSABS 0.1 11/07/2023 0700   BASOSABS 0.1 01/12/2023 1607    No results found for this or any previous visit (from the past 240 hours).   Studies/Results: ECHOCARDIOGRAM COMPLETE  Result Date: 11/08/2023    ECHOCARDIOGRAM REPORT   Patient Name:   Martin [name] Date of Exam: 11/08/2023 Medical Rec #:  [id number]           Height:       75.0 in Accession #:    [id number]          Weight:       139.0 lb Date of Birth:  September 30, 1990           BSA:          1.880 m Patient Age:    32 years            BP:           119/65 mmHg Patient Gender: M                   HR:           73 bpm. Exam Location:  Inpatient Procedure: 2D Echo, 3D Echo, Cardiac Doppler and Color Doppler (Both Spectral            and Color Flow Doppler were utilized during procedure). Indications:     I27.20 Pulmonary Hypertension  History:        Patient has prior history of Echocardiogram examinations, most                 recent 09/15/2022. Signs/Symptoms:Murmur, Shortness of Breath and                 Dyspnea. HIV. Sickle cell anemia.  Sonographer:    Ellouise Mose RDCS Referring Phys: [id number] [facility] E JEGEDE  Sonographer Comments: Syngo delay IMPRESSIONS  1. Left ventricular ejection fraction, by estimation, is 55 to 60%. Left ventricular ejection fraction by 3D volume is 59 %. The left ventricle has normal function. The left ventricle has no regional wall motion abnormalities. There is mild left ventricular hypertrophy. Left ventricular diastolic parameters were normal. There is the interventricular septum is flattened in systole, consistent with right ventricular pressure overload.  2. Right ventricular systolic function is mildly reduced. The right ventricular size is moderately enlarged. There is severely elevated pulmonary artery systolic pressure. The estimated right ventricular systolic pressure is 67.3 mmHg.  3. Left atrial size was moderately dilated.  4. The mitral valve is grossly normal. Mild mitral valve regurgitation. No evidence of mitral stenosis.  5. The aortic valve is tricuspid. Aortic valve regurgitation is not visualized. No aortic stenosis is present.  6. The inferior vena cava is dilated in size with >50% respiratory variability, suggesting right atrial pressure of 8 mmHg. Comparison(s): Prior images reviewed side by side. The pulmonary artery pressure is higher with secondary increase in right ventricular size and decrease in right ventricular systolic function. FINDINGS  Left Ventricle: Left ventricular ejection fraction, by estimation, is 55 to 60%. Left ventricular ejection fraction by 3D volume is 59 %. The left ventricle has normal function. The left ventricle has no regional wall motion abnormalities. The left ventricular internal cavity size was normal in size. There is mild  left ventricular hypertrophy. The interventricular septum is flattened in systole, consistent with right ventricular pressure overload. Left ventricular diastolic parameters were normal. Right Ventricle: The right ventricular size is moderately enlarged. No increase in right ventricular wall thickness. Right ventricular systolic function is mildly reduced. There is severely elevated pulmonary artery systolic pressure. The tricuspid regurgitant velocity is 3.85 m/s, and with an assumed right atrial pressure of 8 mmHg, the estimated right ventricular  systolic pressure is 67.3 mmHg. Left Atrium: Left atrial size was moderately dilated. Right Atrium: Right atrial size was normal in size. Pericardium: There is no evidence of pericardial effusion. Mitral Valve: The mitral valve is grossly normal. Mild mitral valve regurgitation. No evidence of mitral valve stenosis. Tricuspid Valve: The tricuspid valve is normal in structure. Tricuspid valve regurgitation is mild . No evidence of tricuspid stenosis. Aortic Valve: The aortic valve is tricuspid. Aortic valve regurgitation is not visualized. No aortic stenosis is present. Pulmonic Valve: The pulmonic valve was normal in structure. Pulmonic valve regurgitation is not visualized. No evidence of pulmonic stenosis. Aorta: The aortic root and ascending aorta are structurally normal, with no evidence of dilitation. Venous: The inferior vena cava is dilated in size with greater than 50% respiratory variability, suggesting right atrial pressure of 8 mmHg. IAS/Shunts: No atrial level shunt detected by color flow Doppler. Additional Comments: 3D was performed not requiring image post processing on an independent workstation and was normal.  LEFT VENTRICLE PLAX 2D LVIDd:         4.95 cm         Diastology LVIDs:         3.00 cm         LV e' medial:    11.60 cm/s LV PW:         1.20 cm         LV E/e' medial:  8.0 LV IVS:        1.15 cm         LV e' lateral:   18.80 cm/s LVOT diam:      2.40 cm         LV E/e' lateral: 4.9 LV SV:         90 LV SV Index:   48 LVOT Area:     4.52 cm        3D Volume EF                                LV 3D EF:    Left                                             ventricul LV Volumes (MOD)                            ar LV vol d, MOD    148.0 ml                   ejection A2C:                                        fraction LV vol d, MOD    100.9 ml                   by 3D A4C:                                        volume is LV vol s, MOD    64.5 ml  59 %. A2C: LV vol s, MOD    43.1 ml A4C:                           3D Volume EF: LV SV MOD A2C:   83.5 ml       3D EF:        59 % LV SV MOD A4C:   100.9 ml      LV EDV:       172 ml LV SV MOD BP:    71.4 ml       LV ESV:       70 ml                                LV SV:        102 ml RIGHT VENTRICLE             IVC RV S prime:     13.20 cm/s  IVC diam: 2.80 cm TAPSE (M-mode): 2.1 cm LEFT ATRIUM             Index        RIGHT ATRIUM           Index LA diam:        3.60 cm 1.91 cm/m   RA Area:     21.20 cm LA Vol (A2C):   59.6 ml 31.70 ml/m  RA Volume:   69.90 ml  37.18 ml/m LA Vol (A4C):   80.2 ml 42.66 ml/m LA Biplane Vol: 75.6 ml 40.21 ml/m  AORTIC VALVE LVOT Vmax:   102.00 cm/s LVOT Vmean:  65.600 cm/s LVOT VTI:    0.200 m  AORTA Ao Root diam: 3.70 cm Ao Asc diam:  3.60 cm MITRAL VALVE               TRICUSPID VALVE MV Area (PHT): 3.37 cm    TR Peak grad:   59.3 mmHg MV Decel Time: 225 msec    TR Vmax:        385.00 cm/s MV E velocity: 92.40 cm/s MV A velocity: 58.50 cm/s  SHUNTS MV E/A ratio:  1.58        Systemic VTI:  0.20 m                            Systemic Diam: 2.40 cm Mihai Croitoru MD Electronically signed by Jerel Balding MD Signature Date/Time: 11/08/2023/1:22:23 PM    Final      Medications: Scheduled Meds:  abacavir -dolutegravir -lamiVUDine   1 tablet Oral Daily   apixaban   5 mg Oral BID   Chlorhexidine  Gluconate Cloth  6 each Topical Daily   folic acid   0.5 mg Oral Daily    HYDROmorphone    Intravenous Q4H   hydroxyurea   2,000 mg Oral QHS   senna-docusate  1 tablet Oral BID   Continuous Infusions:    PRN Meds:.albuterol , cyclobenzaprine , diphenhydrAMINE , gabapentin , HYDROmorphone  (DILAUDID ) injection, lip balm, naloxone  **AND** sodium chloride  flush, ondansetron  (ZOFRAN ) IV, oxyCODONE -acetaminophen  **AND** oxyCODONE , pantoprazole , polyethylene glycol  Consultants: None  Procedures: None  Antibiotics: None  Assessment/Plan: Principal Problem:   Sickle cell anemia with crisis (HCC) Active Problems:   Generalized anxiety disorder   GERD (gastroesophageal reflux disease)   HIV (human immunodeficiency virus infection) (HCC)   History of pulmonary embolism   Anxiety   Leukocytosis   Anemia  of chronic disease   Chronic hypoxic respiratory failure (HCC)   Hb Sickle Cell Disease with Pain crisis: Continue IVF 0.45% Saline @KVO , continue weight based Dilaudid  PCA, continue oral home pain medications as ordered. Monitor vitals very closely, Re-evaluate pain scale regularly, 2 L of Oxygen  by [place]. Patient encouraged to ambulate on the hallway today.  Leukocytosis: Stable Anemia of Chronic Disease: CBC 7.9 g/dL slightly lower than patient's baseline.  No need for transfusion at this time, will continue to monitor daily CBC. Chronic pain Syndrome: Continue oral home pain medication. Anxiety: Stable, continue medication as prescribed. Chronic hypoxic respiratory failure: Stable, O2 saturation at 95%, continue 2 L oxygen  via nasal cannula. History of pulmonary embolism: Continue Eliquis  as prescribed. History of HIV infection: Continue Abacavir -dolutegravir -lamiVUDine  (TRIUMEQ ) as prescribed.  Code Status: Full Code Family Communication: N/A Disposition Plan: Not yet ready for discharge  [name] Cover NP If 7PM-7AM, please contact night-coverage.  11/09/2023, 1:26 PM  LOS: 2 days

## 2023-11-09 NOTE — Plan of Care (Signed)

## 2023-11-10 ENCOUNTER — Other Ambulatory Visit (HOSPITAL_COMMUNITY): Payer: Self-pay

## 2023-11-10 ENCOUNTER — Other Ambulatory Visit (HOSPITAL_BASED_OUTPATIENT_CLINIC_OR_DEPARTMENT_OTHER): Payer: Self-pay

## 2023-11-10 ENCOUNTER — Other Ambulatory Visit: Payer: Self-pay

## 2023-11-10 LAB — CBC
HCT: 17.9 % — ABNORMAL LOW (ref 39.0–52.0)
Hemoglobin: 7 g/dL — ABNORMAL LOW (ref 13.0–17.0)
MCH: 41.4 pg — ABNORMAL HIGH (ref 26.0–34.0)
MCHC: 39.1 g/dL — ABNORMAL HIGH (ref 30.0–36.0)
MCV: 105.9 fL — ABNORMAL HIGH (ref 80.0–100.0)
Platelets: 284 K/uL (ref 150–400)
RBC: 1.69 MIL/uL — ABNORMAL LOW (ref 4.22–5.81)
RDW: 22.1 % — ABNORMAL HIGH (ref 11.5–15.5)
WBC: 8.5 K/uL (ref 4.0–10.5)
nRBC: 6.8 % — ABNORMAL HIGH (ref 0.0–0.2)

## 2023-11-10 NOTE — TOC Initial Note (Addendum)
 Transition of Care [facility]) - Initial/Assessment Note    Patient Details  Name: Martin [name] MRN: [id number] Date of Birth: Oct 20, 1990  Transition of Care [facility]) CM/SW Contact:    Heather DELENA Saltness, LCSW Phone Number: 11/10/2023, 10:26 AM  Clinical Narrative:                 CSW met with pt at bedside to discuss discharge planning and SDOH screening. Pt reports he's been residing back and fourth between [place]  and [place] . Pt reports having [place]  Medicaid, but his medical care is in [place]. Pt reports needing assistance applying for [place] Medicaid. CSW advised pt to reach out to DSS to assist with applying for Medicaid. Pt reports currently living with his sister in [place]. He reports he normally sleeps in his car or a hotel due to conflict with his sister. Pt inquired about HH services. CSW explained to pt that he needs to qualify for a skilled need (RN/PT) to receive [facility] services. Pt verbalized understanding. Pt reports having transportation home. Pt reports ability to afford some medications, but not all. Secure chat sent to [facility] team. CSW added housing resources and DSS contact information to AVS. TOC will continue to follow for needs.    Expected Discharge Plan: Home/Self Care Barriers to Discharge: Continued Medical Work up, Inadequate or no insurance   Patient Goals and CMS Choice Patient states their goals for this hospitalization and ongoing recovery are:: To return home        Expected Discharge Plan and Services In-house Referral: Clinical Social Work Discharge Planning Services: NA Post Acute Care Choice: NA Living arrangements for the past 2 months: Single Family Home                 DME Arranged: N/A DME Agency: NA       HH Arranged: NA HH Agency: NA        Prior Living Arrangements/Services Living arrangements for the past 2 months: Single Family Home Lives with:: Relatives, Self Patient language and need for interpreter reviewed:: Yes Do you feel  safe going back to the place where you live?: Yes      Need for Family Participation in Patient Care: No (Comment) Care giver support system in place?: No (comment)   Criminal Activity/Legal Involvement Pertinent to Current Situation/Hospitalization: No - Comment as needed  Activities of Daily Living   ADL Screening (condition at time of admission) Independently performs ADLs?: Yes (appropriate for developmental age) Is the patient deaf or have difficulty hearing?: No Does the patient have difficulty seeing, even when wearing glasses/contacts?: No Does the patient have difficulty concentrating, remembering, or making decisions?: No  Permission Sought/Granted   Permission granted to share information with : No  Emotional Assessment Appearance:: Appears stated age Attitude/Demeanor/Rapport: Engaged Affect (typically observed): Pleasant, Appropriate, Stable, Calm Orientation: : Oriented to Self, Oriented to Place, Oriented to  Time, Oriented to Situation Alcohol  / Substance Use: Not Applicable Psych Involvement: No (comment)  Admission diagnosis:  Sickle cell crisis (HCC) [D57.00] Sickle cell anemia with crisis (HCC) [D57.00] Patient Active Problem List   Diagnosis Date Noted   Sickle cell anemia (HCC) 10/28/2023   Sickle cell crisis (HCC) 10/12/2023   Chronic hypoxic respiratory failure (HCC) 08/04/2023   Iron overload, 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   Influenza A with pneumonia 06/20/2023   Acute hypoxemic respiratory failure (HCC) 06/20/2023   AKI (acute kidney injury) (HCC) 06/20/2023   Influenza A with respiratory manifestations 06/20/2023   Acute on chronic  anemia 03/16/2023   History of HIV infection (HCC) 03/16/2023   Hypokalemia 03/16/2023   Anemia of chronic disease 07/13/2022   Hyperbilirubinemia 07/13/2022   Tinea capitis 07/29/2021   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HCC) 06/24/2021   Bacteremia due to Enterococcus 08/29/2020   Acute chest syndrome (HCC) 08/26/2020   Hypoxia  07/09/2020   COVID-19 05/13/2020   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/18/2020   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   Abnormal penile discharge, without blood 02/22/2020   Leukocytosis 01/02/2020   Anxiety 11/27/2019   Proteinuria 11/27/2019   Acute on chronic respiratory failure with hypoxia (HCC) 11/16/2019   Chronic, continuous use of opioids 08/15/2019   Seasonal allergies 08/15/2019   Chest congestion    Sickle cell anemia with crisis (HCC) 08/04/2019   Chronic pain syndrome 08/04/2019   History of pulmonary embolism 08/04/2019   Single subsegmental pulmonary embolism without acute cor pulmonale (HCC) 02/10/2019   Acute chest syndrome due to sickle cell crisis (HCC) 02/10/2019   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) 03/09/2018   Bone pain 03/07/2018   Hip pain 03/07/2018   Acute bronchitis due to Streptococcus 02/21/2018   Heart murmur 02/03/2017   Sickle cell disease with crisis (HCC) 02/02/2017   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 [place] [address] Phone: [phone] Fax: [phone]  [id number] - [place], [place] - [address] AT [facility] [address] [place] [address] Phone: [phone] Fax: [phone]     Social Drivers of Health (SDOH) Social History: SDOH Screenings   Food Insecurity: No Food Insecurity (11/07/2023)  Housing: Low Risk  (11/07/2023)  Transportation Needs: No Transportation Needs (11/07/2023)  Recent  Concern: Transportation Needs - Unmet Transportation Needs (10/12/2023)  Utilities: Not At Risk (11/07/2023)  Recent Concern: Utilities - At Risk (10/12/2023)  Depression (PHQ2-9): Low Risk  (11/03/2023)  Financial Resource Strain: Low Risk  (10/09/2023)   Received from [facility]  Recent Concern: Financial Resource Strain - Medium Risk (08/03/2023)  Physical Activity: Sufficiently Active (10/09/2023)   Received from [facility]  Social Connections: Socially Isolated (10/09/2023)   Received from [facility]  Stress: No Stress Concern Present (10/09/2023)   Received from [facility]  Tobacco Use: Low Risk  (11/07/2023)   SDOH Interventions: Resources added to AVS     Readmission Risk Interventions    11/10/2023   10:19 AM 10/13/2023    1:11 PM 08/12/2023    4:01 PM  Readmission Risk Prevention Plan  Transportation Screening Complete Complete Complete  Medication Review Oceanographer) Referral to Pharmacy Complete Complete  PCP or Specialist appointment within 3-5 days of discharge Complete Complete Complete  HRI or Home Care Consult Complete  Complete  SW Recovery Care/Counseling Consult Complete Complete Complete  Palliative Care Screening Not Applicable Not Applicable Not Applicable  Skilled Nursing Facility Not Applicable Not Applicable Not Applicable    Heather Saltness, MSW, LCSW Clinical Social Worker Inpatient Care Management 11/10/2023 10:32 AM

## 2023-11-10 NOTE — Progress Notes (Signed)
 Patient ID: Martin [name], male   DOB: 1990/11/20, 33 y.o.   MRN: [id number] Subjective: Martin [name]  is a 33 y.o. male with history of sickle cell disease,  chronic pain syndrome, opiate dependency and tolerance, history of HIV, and history of PE on Xarelto.  Patient presents to the emergency department with generalize body pain, lower back and hip pain. He is on 3l oxygen  at home.   Patient is reporting improved pain of 6-7/10 today.  With no new concerns.  Denies cough, fever, Nausea/vomiting/diarrhea.  No urinary symptoms.   Objective:  Vital signs in last 24 hours:  Vitals:   11/10/23 0847 11/10/23 0958 11/10/23 1115 11/10/23 1418  BP:  94/62  106/64  Pulse:  81  94  Resp: 18 18 18 18   Temp:  97.7 F (36.5 C)  98.2 F (36.8 C)  TempSrc:  Oral  Oral  SpO2: 90% 91% 91% (!) 89%  Weight:      Height:        Intake/Output from previous day:   Intake/Output Summary (Last 24 hours) at 11/10/2023 1509 Last data filed at 11/10/2023 1400 Gross per 24 hour  Intake --  Output 2700 ml  Net -2700 ml     Physical Exam: General: Alert, awake, oriented x3, in no acute distress.  HEENT: [place]/AT [place], EOMI Neck: Trachea midline,  no masses, no thyromegal,y no JVD, no carotid bruit OROPHARYNX:  Moist, No exudate/ erythema/lesions.  Heart: Regular rate and rhythm, without murmurs, rubs, gallops, PMI non-displaced, no heaves or thrills on palpation.  Lungs: Clear to auscultation, no wheezing or rhonchi noted. No increased vocal fremitus resonant to percussion  Abdomen: Soft, nontender, nondistended, positive bowel sounds, no masses no hepatosplenomegaly noted..  Neuro: No focal neurological deficits noted cranial nerves II through XII grossly intact. DTRs 2+ bilaterally upper and lower extremities. Strength 5 out of 5 in bilateral upper and lower extremities. Musculoskeletal: Generalized body tenderness. Hip and back tenderness Psychiatric: Patient alert and oriented x3, good  insight and cognition, good recent to remote recall. Lymph node survey: No cervical axillary or inguinal lymphadenopathy noted.  Lab Results:  Basic Metabolic Panel:    Component Value Date/Time   NA 142 11/07/2023 0700   NA 140 01/12/2023 1607   K 4.1 11/07/2023 0700   CL 109 11/07/2023 0700   CO2 24 11/07/2023 0700   BUN 14 11/07/2023 0700   BUN 8 01/12/2023 1607   CREATININE 0.93 11/07/2023 0700   GLUCOSE 91 11/07/2023 0700   CALCIUM  9.0 11/07/2023 0700   CBC:    Component Value Date/Time   WBC 8.5 11/10/2023 0445   HGB 7.0 (L) 11/10/2023 0445   HGB 7.4 (L) 01/12/2023 1607   HCT 17.9 (L) 11/10/2023 0445   HCT 21.7 (L) 01/12/2023 1607   PLT 284 11/10/2023 0445   PLT 321 01/12/2023 1607   MCV 105.9 (H) 11/10/2023 0445   MCV 94 01/12/2023 1607   NEUTROABS 8.7 (H) 11/07/2023 0700   NEUTROABS 3.8 01/12/2023 1607   LYMPHSABS 6.2 (H) 11/07/2023 0700   LYMPHSABS 5.6 (H) 01/12/2023 1607   MONOABS 2.0 (H) 11/07/2023 0700   EOSABS 0.2 11/07/2023 0700   EOSABS 0.2 01/12/2023 1607   BASOSABS 0.1 11/07/2023 0700   BASOSABS 0.1 01/12/2023 1607    No results found for this or any previous visit (from the past 240 hours).   Studies/Results: No results found.    Medications: Scheduled Meds:  abacavir -dolutegravir -lamiVUDine   1 tablet Oral Daily  apixaban   5 mg Oral BID   Chlorhexidine  Gluconate Cloth  6 each Topical Daily   folic acid   0.5 mg Oral Daily   HYDROmorphone    Intravenous Q4H   hydroxyurea   2,000 mg Oral QHS   senna-docusate  1 tablet Oral BID   Continuous Infusions:    PRN Meds:.albuterol , cyclobenzaprine , diphenhydrAMINE , gabapentin , HYDROmorphone  (DILAUDID ) injection, lip balm, naloxone  **AND** sodium chloride  flush, ondansetron  (ZOFRAN ) IV, oxyCODONE -acetaminophen  **AND** oxyCODONE , pantoprazole , polyethylene glycol  Consultants: None  Procedures: None  Antibiotics: None  Assessment/Plan: Principal Problem:   Sickle cell anemia with crisis  (HCC) Active Problems:   Generalized anxiety disorder   GERD (gastroesophageal reflux disease)   HIV (human immunodeficiency virus infection) (HCC)   History of pulmonary embolism   Anxiety   Leukocytosis   Anemia of chronic disease   Chronic hypoxic respiratory failure (HCC)   Hb Sickle Cell Disease with Pain crisis: Continue IVF 0.45% Saline @KVO , continue weight based Dilaudid  PCA, continue oral home pain medications as ordered. Monitor vitals very closely, Re-evaluate pain scale regularly, 2 L of Oxygen  by [place]. Patient encouraged to ambulate on the hallway today.  Leukocytosis: Stable Anemia of Chronic Disease: CBC 7.0 g/dL slightly lower than patient's baseline.  No need for transfusion at this time, will continue to monitor daily CBC. Chronic pain Syndrome: Continue oral home pain medication. Anxiety: Stable, continue medication as prescribed. Chronic hypoxic respiratory failure: Stable, O2 saturation at 95%, continue 2 L oxygen  via nasal cannula. History of pulmonary embolism: Continue Eliquis  as prescribed. History of HIV infection: Continue Abacavir -dolutegravir -lamiVUDine  (TRIUMEQ ) as prescribed.  Code Status: Full Code Family Communication: N/A Disposition Plan: Not yet ready for discharge  [name] Cover NP If 7PM-7AM, please contact night-coverage.  11/10/2023, 3:09 PM  LOS: 3 days

## 2023-11-10 NOTE — Plan of Care (Signed)

## 2023-11-11 ENCOUNTER — Other Ambulatory Visit (HOSPITAL_COMMUNITY): Payer: Self-pay

## 2023-11-11 LAB — CBC
HCT: 19.1 % — ABNORMAL LOW (ref 39.0–52.0)
Hemoglobin: 7.1 g/dL — ABNORMAL LOW (ref 13.0–17.0)
MCH: 38.4 pg — ABNORMAL HIGH (ref 26.0–34.0)
MCHC: 37.2 g/dL — ABNORMAL HIGH (ref 30.0–36.0)
MCV: 103.2 fL — ABNORMAL HIGH (ref 80.0–100.0)
Platelets: 326 K/uL (ref 150–400)
RBC: 1.85 MIL/uL — ABNORMAL LOW (ref 4.22–5.81)
RDW: 21.7 % — ABNORMAL HIGH (ref 11.5–15.5)
WBC: 11 K/uL — ABNORMAL HIGH (ref 4.0–10.5)
nRBC: 8.6 % — ABNORMAL HIGH (ref 0.0–0.2)

## 2023-11-11 MED ORDER — BISACODYL 10 MG RE SUPP
10.0000 mg | Freq: Every day | RECTAL | Status: AC | PRN
  Administered 2023-11-11 – 2023-11-14 (×3): 10 mg via RECTAL
  Filled 2023-11-11 (×3): qty 1

## 2023-11-11 NOTE — Progress Notes (Signed)
 Patient ID: Martin [name], male   DOB: 30-Jul-1990, 33 y.o.   MRN: [id number] Subjective: Martin [name]  is a 33 y.o. male with history of sickle cell disease,  chronic pain syndrome, opiate dependency and tolerance, history of HIV, and history of PE on Xarelto.  Patient presents to the emergency department with generalize body pain, lower back and hip pain. He is on 3l oxygen  at home.   Patient is reporting improved pain of 6/10 today.  With no new concerns.  Denies cough, fever, Nausea/vomiting/diarrhea.  No urinary symptoms.   Objective:  Vital signs in last 24 hours:  Vitals:   11/11/23 0812 11/11/23 0936 11/11/23 1331 11/11/23 1344  BP:  108/73  120/71  Pulse:  83  (!) 107  Resp: 14 20 16 18   Temp:  98.7 F (37.1 C)  98 F (36.7 C)  TempSrc:  Oral  Oral  SpO2: 93% 95% 90% (!) 88%  Weight:      Height:        Intake/Output from previous day:   Intake/Output Summary (Last 24 hours) at 11/11/2023 1456 Last data filed at 11/11/2023 1331 Gross per 24 hour  Intake 720 ml  Output 1150 ml  Net -430 ml     Physical Exam: General: Alert, awake, oriented x3, in no acute distress.  HEENT: [place]/AT [place], EOMI Neck: Trachea midline,  no masses, no thyromegal,y no JVD, no carotid bruit OROPHARYNX:  Moist, No exudate/ erythema/lesions.  Heart: Regular rate and rhythm, without murmurs, rubs, gallops, PMI non-displaced, no heaves or thrills on palpation.  Lungs: Clear to auscultation, no wheezing or rhonchi noted. No increased vocal fremitus resonant to percussion  Abdomen: Soft, nontender, nondistended, positive bowel sounds, no masses no hepatosplenomegaly noted..  Neuro: No focal neurological deficits noted cranial nerves II through XII grossly intact. DTRs 2+ bilaterally upper and lower extremities. Strength 5 out of 5 in bilateral upper and lower extremities. Musculoskeletal: Generalized body tenderness. Hip and back tenderness Psychiatric: Patient alert and oriented x3,  good insight and cognition, good recent to remote recall. Lymph node survey: No cervical axillary or inguinal lymphadenopathy noted.  Lab Results:  Basic Metabolic Panel:    Component Value Date/Time   NA 142 11/07/2023 0700   NA 140 01/12/2023 1607   K 4.1 11/07/2023 0700   CL 109 11/07/2023 0700   CO2 24 11/07/2023 0700   BUN 14 11/07/2023 0700   BUN 8 01/12/2023 1607   CREATININE 0.93 11/07/2023 0700   GLUCOSE 91 11/07/2023 0700   CALCIUM  9.0 11/07/2023 0700   CBC:    Component Value Date/Time   WBC 11.0 (H) 11/11/2023 0846   HGB 7.1 (L) 11/11/2023 0846   HGB 7.4 (L) 01/12/2023 1607   HCT 19.1 (L) 11/11/2023 0846   HCT 21.7 (L) 01/12/2023 1607   PLT 326 11/11/2023 0846   PLT 321 01/12/2023 1607   MCV 103.2 (H) 11/11/2023 0846   MCV 94 01/12/2023 1607   NEUTROABS 8.7 (H) 11/07/2023 0700   NEUTROABS 3.8 01/12/2023 1607   LYMPHSABS 6.2 (H) 11/07/2023 0700   LYMPHSABS 5.6 (H) 01/12/2023 1607   MONOABS 2.0 (H) 11/07/2023 0700   EOSABS 0.2 11/07/2023 0700   EOSABS 0.2 01/12/2023 1607   BASOSABS 0.1 11/07/2023 0700   BASOSABS 0.1 01/12/2023 1607    No results found for this or any previous visit (from the past 240 hours).   Studies/Results: No results found.    Medications: Scheduled Meds:  abacavir -dolutegravir -lamiVUDine   1 tablet Oral  Daily   apixaban   5 mg Oral BID   Chlorhexidine  Gluconate Cloth  6 each Topical Daily   folic acid   0.5 mg Oral Daily   HYDROmorphone    Intravenous Q4H   hydroxyurea   2,000 mg Oral QHS   senna-docusate  1 tablet Oral BID   Continuous Infusions:    PRN Meds:.albuterol , cyclobenzaprine , diphenhydrAMINE , gabapentin , HYDROmorphone  (DILAUDID ) injection, lip balm, naloxone  **AND** sodium chloride  flush, ondansetron  (ZOFRAN ) IV, oxyCODONE -acetaminophen  **AND** oxyCODONE , pantoprazole , polyethylene glycol  Consultants: None  Procedures: None  Antibiotics: None  Assessment/Plan: Principal Problem:   Sickle cell anemia  with crisis (HCC) Active Problems:   Generalized anxiety disorder   GERD (gastroesophageal reflux disease)   HIV (human immunodeficiency virus infection) (HCC)   History of pulmonary embolism   Anxiety   Leukocytosis   Anemia of chronic disease   Chronic hypoxic respiratory failure (HCC)   Hb Sickle Cell Disease with Pain crisis: Continue IVF 0.45% Saline @KVO , continue weight based Dilaudid  PCA, continue oral home pain medications as ordered. Monitor vitals very closely, Re-evaluate pain scale regularly, 2 L of Oxygen  by [place]. Patient encouraged to ambulate on the hallway today.  Leukocytosis: Slightly elevated. Patient is afebrile, vital signs are within normal limits. patient has no signs or symptoms of infection.  Will manage without antibiotics at this time and continue to monitor daily CBC. Anemia of Chronic Disease: CBC 7.1 g/dL slightly lower than patient's baseline. No need for transfusion at this time, will continue to monitor daily CBC. Chronic pain Syndrome: Continue oral home pain medication. Anxiety: Stable, continue medication as prescribed. Chronic hypoxic respiratory failure:  O2 saturation between 81 to 89% on room air, encourage patient to continue oxygen  via nasal cannula.  Oxygen  bumped to 4 L via nasal cannula.  Patient baseline is 90% on 3 L at home. Continue to monitor History of pulmonary embolism: Continue Eliquis  as prescribed. History of HIV infection: Continue Abacavir -dolutegravir -lamiVUDine  (TRIUMEQ ) as prescribed.  Code Status: Full Code Family Communication: N/A Disposition Plan: Not yet ready for discharge  [name] Cover NP If 7PM-7AM, please contact night-coverage.  11/11/2023, 2:56 PM  LOS: 4 days

## 2023-11-11 NOTE — Plan of Care (Signed)
  Problem: Education: Goal: Knowledge of General Education information will improve Description: Including pain rating scale, medication(s)/side effects and non-pharmacologic comfort measures Outcome: Progressing   Problem: Health Behavior/Discharge Planning: Goal: Ability to manage health-related needs will improve Outcome: Progressing   Problem: Clinical Measurements: Goal: Ability to maintain clinical measurements within normal limits will improve Outcome: Progressing Goal: Will remain free from infection Outcome: Progressing Goal: Diagnostic test results will improve Outcome: Progressing Goal: Respiratory complications will improve Outcome: Progressing Goal: Cardiovascular complication will be avoided Outcome: Progressing   Problem: Activity: Goal: Risk for activity intolerance will decrease Outcome: Progressing   Problem: Coping: Goal: Level of anxiety will decrease Outcome: Progressing   Problem: Elimination: Goal: Will not experience complications related to bowel motility Outcome: Progressing Goal: Will not experience complications related to urinary retention Outcome: Progressing   Problem: Safety: Goal: Ability to remain free from injury will improve Outcome: Progressing   Problem: Skin Integrity: Goal: Risk for impaired skin integrity will decrease Outcome: Progressing   Problem: Education: Goal: Knowledge of vaso-occlusive preventative measures will improve Outcome: Progressing Goal: Awareness of infection prevention will improve Outcome: Progressing Goal: Awareness of signs and symptoms of anemia will improve Outcome: Progressing Goal: Long-term complications will improve Outcome: Progressing   Problem: Self-Care: Goal: Ability to incorporate actions that prevent/reduce pain crisis will improve Outcome: Progressing   Problem: Bowel/Gastric: Goal: Gut motility will be maintained Outcome: Progressing   Problem: Tissue Perfusion: Goal:  Complications related to inadequate tissue perfusion will be avoided or minimized Outcome: Progressing   Problem: Respiratory: Goal: Pulmonary complications will be avoided or minimized Outcome: Progressing Goal: Acute Chest Syndrome will be identified early to prevent complications Outcome: Progressing   Problem: Fluid Volume: Goal: Ability to maintain a balanced intake and output will improve Outcome: Progressing   Problem: Sensory: Goal: Pain level will decrease with appropriate interventions Outcome: Progressing   Problem: Health Behavior: Goal: Postive changes in compliance with treatment and prescription regimens will improve Outcome: Progressing

## 2023-11-11 NOTE — TOC Progression Note (Signed)
 Transition of Care [facility]) - Progression Note    Patient Details  Name: Martin [name] MRN: [id number] Date of Birth: 07/20/1990  [facility]) CM/SW Contact  Sonda Manuella Quill, RN Phone Number: 11/11/2023, 1:51 PM  Clinical Narrative:    [facility] consulted for [facility] aide; pt does not have identified skill need for services; pt is Medicaid pending; spoke w/ pt in room; pt says he will d/c home w/ his sister who lives in [place]; pt will contact his mother for info on home oxygen  provider; pt says he has concentrator ar home;he does not have a travel tank; will pass on to oncoming TOC for follow up.   Expected Discharge Plan: Home/Self Care Barriers to Discharge: Continued Medical Work up, Inadequate or no insurance               Expected Discharge Plan and Services In-house Referral: Clinical Social Work Discharge Planning Services: NA Post Acute Care Choice: NA Living arrangements for the past 2 months: Single Family Home                 DME Arranged: N/A DME Agency: NA       HH Arranged: NA HH Agency: NA         Social Drivers of Health (SDOH) Interventions SDOH Screenings   Food Insecurity: No Food Insecurity (11/07/2023)  Housing: Low Risk  (11/07/2023)  Transportation Needs: No Transportation Needs (11/07/2023)  Recent Concern: Transportation Needs - Unmet Transportation Needs (10/12/2023)  Utilities: Not At Risk (11/07/2023)  Recent Concern: Utilities - At Risk (10/12/2023)  Depression (PHQ2-9): Low Risk  (11/03/2023)  Financial Resource Strain: Low Risk  (10/09/2023)   Received from [facility]  Recent Concern: Financial Resource Strain - Medium Risk (08/03/2023)  Physical Activity: Sufficiently Active (10/09/2023)   Received from [facility]  Social Connections: Socially Isolated (10/09/2023)   Received from [facility]  Stress: No Stress Concern Present (10/09/2023)   Received from [facility]  Tobacco Use: Low Risk  (11/07/2023)     Readmission Risk Interventions    11/10/2023   10:19 AM 10/13/2023    1:11 PM 08/12/2023    4:01 PM  Readmission Risk Prevention Plan  Transportation Screening Complete Complete Complete  Medication Review Oceanographer) Referral to Pharmacy Complete Complete  PCP or Specialist appointment within 3-5 days of discharge Complete Complete Complete  HRI or Home Care Consult Complete  Complete  SW Recovery Care/Counseling Consult Complete Complete Complete  Palliative Care Screening Not Applicable Not Applicable Not Applicable  Skilled Nursing Facility Not Applicable Not Applicable Not Applicable

## 2023-11-12 ENCOUNTER — Other Ambulatory Visit (HOSPITAL_COMMUNITY): Payer: Self-pay

## 2023-11-12 LAB — CBC
HCT: 17.8 % — ABNORMAL LOW (ref 39.0–52.0)
Hemoglobin: 6.7 g/dL — CL (ref 13.0–17.0)
MCH: 38.3 pg — ABNORMAL HIGH (ref 26.0–34.0)
MCHC: 37.6 g/dL — ABNORMAL HIGH (ref 30.0–36.0)
MCV: 101.7 fL — ABNORMAL HIGH (ref 80.0–100.0)
Platelets: 339 K/uL (ref 150–400)
RBC: 1.75 MIL/uL — ABNORMAL LOW (ref 4.22–5.81)
RDW: 22.2 % — ABNORMAL HIGH (ref 11.5–15.5)
WBC: 11.2 K/uL — ABNORMAL HIGH (ref 4.0–10.5)
nRBC: 12.9 % — ABNORMAL HIGH (ref 0.0–0.2)

## 2023-11-12 LAB — PREPARE RBC (CROSSMATCH)

## 2023-11-12 MED ORDER — ACETAMINOPHEN 325 MG PO TABS: 650.0000 mg | ORAL_TABLET | Freq: Once | ORAL | Status: DC

## 2023-11-12 MED ORDER — SODIUM CHLORIDE 0.9% FLUSH: 10.0000 mL | INTRAVENOUS | Status: DC | PRN

## 2023-11-12 MED ORDER — SODIUM CHLORIDE 0.9% FLUSH
10.0000 mL | Freq: Two times a day (BID) | INTRAVENOUS | Status: DC
  Administered 2023-11-12 – 2023-11-15 (×6): 10 mL

## 2023-11-12 MED ORDER — DIPHENHYDRAMINE HCL 25 MG PO CAPS: 25.0000 mg | ORAL_CAPSULE | Freq: Once | ORAL | Status: DC

## 2023-11-12 MED ORDER — SODIUM CHLORIDE 0.9% IV SOLUTION: Freq: Once | INTRAVENOUS | Status: AC

## 2023-11-12 NOTE — Plan of Care (Signed)

## 2023-11-12 NOTE — Progress Notes (Signed)
 Patient ID: Martin [name], male   DOB: May 24, 1990, 33 y.o.   MRN: [id number] Subjective: Martin [name]  is a 33 y.o. male with history of sickle cell disease,  chronic pain syndrome, opiate dependency and tolerance, history of HIV, and history of PE on Xarelto.  Patient presents to the emergency department with generalize body pain, lower back and hip pain. He is on 3l oxygen  at home.   Patient is reporting improved pain of 5/10 today.  With no new concerns.  Denies cough, fever, Nausea/vomiting/diarrhea.  No urinary symptoms.   Objective:  Vital signs in last 24 hours:  Vitals:   11/12/23 0520 11/12/23 0739 11/12/23 1003 11/12/23 1125  BP: 103/70  102/75   Pulse: 83  85   Resp: 18 16 14 10   Temp: 97.7 F (36.5 C)  98.1 F (36.7 C)   TempSrc: Oral  Oral   SpO2: 91%  91% 92%  Weight:      Height:        Intake/Output from previous day:   Intake/Output Summary (Last 24 hours) at 11/12/2023 1241 Last data filed at 11/12/2023 0500 Gross per 24 hour  Intake 240 ml  Output 1175 ml  Net -935 ml     Physical Exam: General: Alert, awake, oriented x3, in no acute distress.  HEENT: [place]/AT PEERL, EOMI Neck: Trachea midline,  no masses, no thyromegal,y no JVD, no carotid bruit OROPHARYNX:  Moist, No exudate/ erythema/lesions.  Heart: Regular rate and rhythm, without murmurs, rubs, gallops, PMI non-displaced, no heaves or thrills on palpation.  Lungs: Clear to auscultation, no wheezing or rhonchi noted. No increased vocal fremitus resonant to percussion  Abdomen: Soft, nontender, nondistended, positive bowel sounds, no masses no hepatosplenomegaly noted..  Neuro: No focal neurological deficits noted cranial nerves II through XII grossly intact. DTRs 2+ bilaterally upper and lower extremities. Strength 5 out of 5 in bilateral upper and lower extremities. Musculoskeletal: Generalized body tenderness. Hip and back tenderness Psychiatric: Patient alert and oriented x3, good insight  and cognition, good recent to remote recall. Lymph node survey: No cervical axillary or inguinal lymphadenopathy noted.  Lab Results:  Basic Metabolic Panel:    Component Value Date/Time   NA 142 11/07/2023 0700   NA 140 01/12/2023 1607   K 4.1 11/07/2023 0700   CL 109 11/07/2023 0700   CO2 24 11/07/2023 0700   BUN 14 11/07/2023 0700   BUN 8 01/12/2023 1607   CREATININE 0.93 11/07/2023 0700   GLUCOSE 91 11/07/2023 0700   CALCIUM  9.0 11/07/2023 0700   CBC:    Component Value Date/Time   WBC 11.2 (H) 11/12/2023 0448   HGB 6.7 (LL) 11/12/2023 0448   HGB 7.4 (L) 01/12/2023 1607   HCT 17.8 (L) 11/12/2023 0448   HCT 21.7 (L) 01/12/2023 1607   PLT 339 11/12/2023 0448   PLT 321 01/12/2023 1607   MCV 101.7 (H) 11/12/2023 0448   MCV 94 01/12/2023 1607   NEUTROABS 8.7 (H) 11/07/2023 0700   NEUTROABS 3.8 01/12/2023 1607   LYMPHSABS 6.2 (H) 11/07/2023 0700   LYMPHSABS 5.6 (H) 01/12/2023 1607   MONOABS 2.0 (H) 11/07/2023 0700   EOSABS 0.2 11/07/2023 0700   EOSABS 0.2 01/12/2023 1607   BASOSABS 0.1 11/07/2023 0700   BASOSABS 0.1 01/12/2023 1607    No results found for this or any previous visit (from the past 240 hours).   Studies/Results: No results found.    Medications: Scheduled Meds:  sodium chloride    Intravenous Once  abacavir -dolutegravir -lamiVUDine   1 tablet Oral Daily   acetaminophen   650 mg Oral Once   apixaban   5 mg Oral BID   Chlorhexidine  Gluconate Cloth  6 each Topical Daily   diphenhydrAMINE   25 mg Oral Once   folic acid   0.5 mg Oral Daily   HYDROmorphone    Intravenous Q4H   hydroxyurea   2,000 mg Oral QHS   senna-docusate  1 tablet Oral BID   sodium chloride  flush  10-40 mL Intracatheter Q12H   Continuous Infusions:    PRN Meds:.albuterol , bisacodyl , cyclobenzaprine , diphenhydrAMINE , gabapentin , HYDROmorphone  (DILAUDID ) injection, lip balm, naloxone  **AND** sodium chloride  flush, ondansetron  (ZOFRAN ) IV, oxyCODONE -acetaminophen  **AND** oxyCODONE ,  pantoprazole , polyethylene glycol, sodium chloride  flush  Consultants: None  Procedures: Blood transfusion   Antibiotics: None  Assessment/Plan: Principal Problem:   Sickle cell anemia with crisis (HCC) Active Problems:   Generalized anxiety disorder   GERD (gastroesophageal reflux disease)   HIV (human immunodeficiency virus infection) (HCC)   History of pulmonary embolism   Anxiety   Leukocytosis   Anemia of chronic disease   Chronic hypoxic respiratory failure (HCC)   Hb Sickle Cell Disease with Pain crisis: Continue IVF 0.45% Saline @KVO , continue weight based Dilaudid  PCA, continue oral home pain medications as ordered. Monitor vitals very closely, Re-evaluate pain scale regularly, 2 L of Oxygen  by [place]. Patient encouraged to ambulate on the hallway today.  Leukocytosis: Slightly elevated. Patient is afebrile, vital signs are within normal limits. patient has no signs or symptoms of infection.  Will manage without antibiotics at this time and continue to monitor daily CBC. Anemia of Chronic Disease: CBC 6.7 g/dL  lower than patient's baseline. Transfuse 1 unit PRBC. will continue to monitor daily CBC. Chronic pain Syndrome: Continue oral home pain medication. Anxiety: Stable, continue medication as prescribed. Chronic hypoxic respiratory failure:  O2 saturation between 81 to 89% on room air, encourage patient to continue oxygen  via nasal cannula.  Oxygen  bumped to 4 L via nasal cannula.  Patient baseline is 90% on 3 L at home. Continue to monitor History of pulmonary embolism: Continue Eliquis  as prescribed. History of HIV infection: Continue Abacavir -dolutegravir -lamiVUDine  (TRIUMEQ ) as prescribed.  Code Status: Full Code Family Communication: N/A Disposition Plan: patient will be ready for d/c in the am after blood transfusion today.  [name] Cover NP If 7PM-7AM, please contact night-coverage.  11/12/2023, 12:41 PM  LOS: 5 days

## 2023-11-13 LAB — CBC
HCT: 20.1 % — ABNORMAL LOW (ref 39.0–52.0)
Hemoglobin: 7.6 g/dL — ABNORMAL LOW (ref 13.0–17.0)
MCH: 38.4 pg — ABNORMAL HIGH (ref 26.0–34.0)
MCHC: 37.8 g/dL — ABNORMAL HIGH (ref 30.0–36.0)
MCV: 101.5 fL — ABNORMAL HIGH (ref 80.0–100.0)
Platelets: 416 K/uL — ABNORMAL HIGH (ref 150–400)
RBC: 1.98 MIL/uL — ABNORMAL LOW (ref 4.22–5.81)
RDW: 22.5 % — ABNORMAL HIGH (ref 11.5–15.5)
WBC: 10.2 K/uL (ref 4.0–10.5)
nRBC: 19.6 % — ABNORMAL HIGH (ref 0.0–0.2)

## 2023-11-13 NOTE — Progress Notes (Signed)
 SICKLE CELL SERVICE PROGRESS NOTE  Martin [name] FMW:[id number] DOB: 11-Aug-1990 DOA: 11/07/2023 PCP: [name], [name], [name]  Assessment/Plan: Principal Problem:   Sickle cell anemia with crisis (HCC) Active Problems:   HIV (human immunodeficiency virus infection) (HCC)   History of pulmonary embolism   Generalized anxiety disorder   GERD (gastroesophageal reflux disease)   Anxiety   Leukocytosis   Anemia of chronic disease   Chronic hypoxic respiratory failure (HCC)  Sickle cell anemia with crisis: Patient's pain is at 7 out of 10.  Continue with Dilaudid  PCA, continue on oxygen  and oral medications.  Continue to monitor Anemia of chronic disease: H&H stable.  Continue to monitor oxygen .  Currently on Xarelto. Leukocytosis: Continue to monitor white count. Chronic pain syndrome: Continue chronic home medications Anxiety disorder: Continue chronic medications. Chronic hypoxic respiratory failure: On home oxygen .  Continue History of pulmonary embolism: Continue Eliquis  HIV infection: Continue home regimen  Code Status: Full code Family Communication: No family at bedside Disposition Plan: Home when ready  [facility]  Pager [phone]. If 7PM-7AM, please contact night-coverage.  11/13/2023, 10:52 AM  LOS: 6 days   Brief narrative: Martin [name]  is a 33 y.o. male with history of sickle cell disease,  chronic pain syndrome, opiate dependency and tolerance, history of HIV, and history of PE on Xarelto.  Patient presents to the emergency department with generalize body pain, lower back and hip pain. He is on 3l oxygen  at home.    Patient is reporting improved pain of 5/10 today.  With no new concerns.  Denies cough, fever, Nausea/vomiting/diarrhea.  No urinary symptoms.  Consultants: None  Procedures: Chest x-ray  Antibiotics: None  HPI/Subjective: Patient's pain is said to be a 7 out of 10.  No fever or chills no nausea vomiting or diarrhea.  He has received  blood transfusion  Objective: Vitals:   11/13/23 0407 11/13/23 0501 11/13/23 0816 11/13/23 0931  BP:  94/77  96/66  Pulse: 88 82  89  Resp: 13 14 15 18   Temp:  97.9 F (36.6 C)  98.2 F (36.8 C)  TempSrc:  Oral  Oral  SpO2: 94% 92% 95% 93%  Weight:      Height:       Weight change:   Intake/Output Summary (Last 24 hours) at 11/13/2023 1052 Last data filed at 11/13/2023 0502 Gross per 24 hour  Intake 500.66 ml  Output 2250 ml  Net -1749.34 ml    General: Alert, awake, oriented x3, in no acute distress.  HEENT: [place]/AT [place]ER[place], EOMI Neck: Trachea midline,  no masses, no thyromegal,y no JVD, no carotid bruit OROPHARYNX:  Moist, No exudate/ erythema/lesions.  Heart: Regular rate and rhythm, without murmurs, rubs, gallops, PMI non-displaced, no heaves or thrills on palpation.  Lungs: Clear to auscultation, no wheezing or rhonchi noted. No increased vocal fremitus resonant to percussion  Abdomen: Soft, nontender, nondistended, positive bowel sounds, no masses no hepatosplenomegaly noted..  Neuro: No focal neurological deficits noted cranial nerves II through XII grossly intact. DTRs 2+ bilaterally upper and lower extremities. Strength 5 out of 5 in bilateral upper and lower extremities. Musculoskeletal: No warm swelling or erythema around joints, no spinal tenderness noted. Psychiatric: Patient alert and oriented x3, good insight and cognition, good recent to remote recall. Lymph node survey: No cervical axillary or inguinal lymphadenopathy noted.   Data Reviewed: Basic Metabolic Panel: Recent Labs  Lab 11/07/23 0700  NA 142  K 4.1  CL 109  CO2 24  GLUCOSE  91  BUN 14  CREATININE 0.93  CALCIUM  9.0   Liver Function Tests: Recent Labs  Lab 11/07/23 0700 11/07/23 1650  AST 32 32  ALT 14 18  ALKPHOS 60 62  BILITOT 11.1* 9.6*  PROT 7.9 7.9  ALBUMIN 3.9 3.8   No results for input(s): LIPASE, AMYLASE in the last 168 hours. No results for input(s): AMMONIA in the  last 168 hours. CBC: Recent Labs  Lab 11/07/23 0700 11/08/23 0510 11/10/23 0445 11/11/23 0846 11/12/23 0448 11/13/23 0434 11/13/23 0435  WBC 17.2*   < > 8.5 11.0* 11.2* CANCELLED BY LAB 10.2  NEUTROABS 8.7*  --   --   --   --   --   --   HGB 8.2*   < > 7.0* 7.1* 6.7* CANCELLED BY LAB 7.6*  HCT 23.4*   < > 17.9* 19.1* 17.8* CANCELLED BY LAB 20.1*  MCV 109.3*   < > 105.9* 103.2* 101.7* CANCELLED BY LAB 101.5*  PLT 352   < > 284 326 339 CANCELLED BY LAB 416*   < > = values in this interval not displayed.   Cardiac Enzymes: No results for input(s): CKTOTAL, CKMB, CKMBINDEX, TROPONINI in the last 168 hours. BNP (last 3 results) No results for input(s): BNP in the last 8760 hours.  ProBNP (last 3 results) No results for input(s): PROBNP in the last 8760 hours.  CBG: No results for input(s): GLUCAP in the last 168 hours.  No results found for this or any previous visit (from the past 240 hours).   Studies: ECHOCARDIOGRAM COMPLETE Result Date: 11/08/2023    ECHOCARDIOGRAM REPORT   Patient Name:   Martin [name] Date of Exam: 11/08/2023 Medical Rec #:  [id number]           Height:       75.0 in Accession #:    [id number]          Weight:       139.0 lb Date of Birth:  1990-08-13           BSA:          1.880 m Patient Age:    32 years            BP:           119/65 mmHg Patient Gender: M                   HR:           73 bpm. Exam Location:  Inpatient Procedure: 2D Echo, 3D Echo, Cardiac Doppler and Color Doppler (Both Spectral            and Color Flow Doppler were utilized during procedure). Indications:    I27.20 Pulmonary Hypertension  History:        Patient has prior history of Echocardiogram examinations, most                 recent 09/15/2022. Signs/Symptoms:Murmur, Shortness of Breath and                 Dyspnea. HIV. Sickle cell anemia.  Sonographer:    Ellouise Mose RDCS Referring Phys: [id number] [facility] E JEGEDE  Sonographer Comments: Syngo delay IMPRESSIONS  1.  Left ventricular ejection fraction, by estimation, is 55 to 60%. Left ventricular ejection fraction by 3D volume is 59 %. The left ventricle has normal function. The left ventricle has no regional wall motion abnormalities. There is mild left ventricular hypertrophy. Left ventricular  diastolic parameters were normal. There is the interventricular septum is flattened in systole, consistent with right ventricular pressure overload.  2. Right ventricular systolic function is mildly reduced. The right ventricular size is moderately enlarged. There is severely elevated pulmonary artery systolic pressure. The estimated right ventricular systolic pressure is 67.3 mmHg.  3. Left atrial size was moderately dilated.  4. The mitral valve is grossly normal. Mild mitral valve regurgitation. No evidence of mitral stenosis.  5. The aortic valve is tricuspid. Aortic valve regurgitation is not visualized. No aortic stenosis is present.  6. The inferior vena cava is dilated in size with >50% respiratory variability, suggesting right atrial pressure of 8 mmHg. Comparison(s): Prior images reviewed side by side. The pulmonary artery pressure is higher with secondary increase in right ventricular size and decrease in right ventricular systolic function. FINDINGS  Left Ventricle: Left ventricular ejection fraction, by estimation, is 55 to 60%. Left ventricular ejection fraction by 3D volume is 59 %. The left ventricle has normal function. The left ventricle has no regional wall motion abnormalities. The left ventricular internal cavity size was normal in size. There is mild left ventricular hypertrophy. The interventricular septum is flattened in systole, consistent with right ventricular pressure overload. Left ventricular diastolic parameters were normal. Right Ventricle: The right ventricular size is moderately enlarged. No increase in right ventricular wall thickness. Right ventricular systolic function is mildly reduced. There is  severely elevated pulmonary artery systolic pressure. The tricuspid regurgitant velocity is 3.85 m/s, and with an assumed right atrial pressure of 8 mmHg, the estimated right ventricular systolic pressure is 67.3 mmHg. Left Atrium: Left atrial size was moderately dilated. Right Atrium: Right atrial size was normal in size. Pericardium: There is no evidence of pericardial effusion. Mitral Valve: The mitral valve is grossly normal. Mild mitral valve regurgitation. No evidence of mitral valve stenosis. Tricuspid Valve: The tricuspid valve is normal in structure. Tricuspid valve regurgitation is mild . No evidence of tricuspid stenosis. Aortic Valve: The aortic valve is tricuspid. Aortic valve regurgitation is not visualized. No aortic stenosis is present. Pulmonic Valve: The pulmonic valve was normal in structure. Pulmonic valve regurgitation is not visualized. No evidence of pulmonic stenosis. Aorta: The aortic root and ascending aorta are structurally normal, with no evidence of dilitation. Venous: The inferior vena cava is dilated in size with greater than 50% respiratory variability, suggesting right atrial pressure of 8 mmHg. IAS/Shunts: No atrial level shunt detected by color flow Doppler. Additional Comments: 3D was performed not requiring image post processing on an independent workstation and was normal.  LEFT VENTRICLE PLAX 2D LVIDd:         4.95 cm         Diastology LVIDs:         3.00 cm         LV e' medial:    11.60 cm/s LV PW:         1.20 cm         LV E/e' medial:  8.0 LV IVS:        1.15 cm         LV e' lateral:   18.80 cm/s LVOT diam:     2.40 cm         LV E/e' lateral: 4.9 LV SV:         90 LV SV Index:   48 LVOT Area:     4.52 cm        3D Volume EF  LV 3D EF:    Left                                             ventricul LV Volumes (MOD)                            ar LV vol d, MOD    148.0 ml                   ejection A2C:                                         fraction LV vol d, MOD    100.9 ml                   by 3D A4C:                                        volume is LV vol s, MOD    64.5 ml                    59 %. A2C: LV vol s, MOD    43.1 ml A4C:                           3D Volume EF: LV SV MOD A2C:   83.5 ml       3D EF:        59 % LV SV MOD A4C:   100.9 ml      LV EDV:       172 ml LV SV MOD BP:    71.4 ml       LV ESV:       70 ml                                LV SV:        102 ml RIGHT VENTRICLE             IVC RV S prime:     13.20 cm/s  IVC diam: 2.80 cm TAPSE (M-mode): 2.1 cm LEFT ATRIUM             Index        RIGHT ATRIUM           Index LA diam:        3.60 cm 1.91 cm/m   RA Area:     21.20 cm LA Vol (A2C):   59.6 ml 31.70 ml/m  RA Volume:   69.90 ml  37.18 ml/m LA Vol (A4C):   80.2 ml 42.66 ml/m LA Biplane Vol: 75.6 ml 40.21 ml/m  AORTIC VALVE LVOT Vmax:   102.00 cm/s LVOT Vmean:  65.600 cm/s LVOT VTI:    0.200 m  AORTA Ao Root diam: 3.70 cm Ao Asc diam:  3.60 cm MITRAL VALVE               TRICUSPID VALVE MV Area (PHT): 3.37 cm    TR Peak grad:  59.3 mmHg MV Decel Time: 225 msec    TR Vmax:        385.00 cm/s MV E velocity: 92.40 cm/s MV A velocity: 58.50 cm/s  SHUNTS MV E/A ratio:  1.58        Systemic VTI:  0.20 m                            Systemic Diam: 2.40 cm Mihai Croitoru MD Electronically signed by Jerel Balding MD Signature Date/Time: 11/08/2023/1:22:23 PM    Final    DG Chest Portable 1 View Result Date: 11/07/2023 CLINICAL DATA:  Low O2 EXAM: PORTABLE CHEST - 1 VIEW COMPARISON:  10/27/2023 FINDINGS: Ground-glass opacities and mild interstitial prominence in a predominantly perihilar and bibasilar distribution left greater than right, slightly more conspicuous than on prior exam. Stable right IJ port catheter to the distal SVC. Heart size and mediastinal contours are within normal limits. No effusion. Visualized bones unremarkable. IMPRESSION: Slightly more conspicuous ground-glass opacities and interstitial prominence.  Electronically Signed   By: JONETTA Faes M.D.   On: 11/07/2023 10:24   DG Chest Port 1 View Result Date: 10/27/2023 CLINICAL DATA:  Left-sided chest pain. EXAM: PORTABLE CHEST 1 VIEW COMPARISON:  PA Lat chest and chest CT no contrast both 10/15/2023 FINDINGS: Right chest port with IJ approach catheter again terminates in the distal SVC. Hyperinflation and scattered scarring changes of the lung fields are again noted with stable areas of architectural distortion, and chronic ground-glass disease better visualized on CT. No active lung infiltrate is seen, no substantial pleural effusion. The cardiomediastinal silhouette and vascular markings are normal. No edema is seen. Thoracic cage is intact. IMPRESSION: No evidence of acute chest disease. Chronic changes including overinflation and scarring. Electronically Signed   By: Francis Quam M.D.   On: 10/27/2023 00:23   CT CHEST WO CONTRAST Result Date: 10/15/2023 CLINICAL DATA:  Chest pain.  History of sickle cell disease. EXAM: CT CHEST WITHOUT CONTRAST TECHNIQUE: Multidetector CT imaging of the chest was performed following the standard protocol without IV contrast. RADIATION DOSE REDUCTION: This exam was performed according to the departmental dose-optimization program which includes automated exposure control, adjustment of the mA and/or kV according to patient size and/or use of iterative reconstruction technique. COMPARISON:  CTA chest dated 10/12/2023. FINDINGS: Cardiovascular: Cardiomegaly, unchanged. Similar trace pericardial effusion. Similar enlarged pulmonary trunk measuring up to 3.7 cm, which can be seen in the setting of pulmonary arterial hypertension. Stable right chest port catheter tip terminates at the level of the superior cavoatrial junction. Mediastinum/Nodes: Stable 1.4 cm subcarinal lymph node (2:81). Similar prominent axillary nodes. The previously noted prominent right hilar lymph nodes are not well evaluated on this noncontrast  examination. Redemonstrated substernal thymus. Thyroid gland, trachea, and esophagus demonstrate no significant findings. Lungs/Pleura: Redemonstrated extensive multifocal scarring with areas of architectural distortion chronic ground-glass changes, with a lower lung zone predominance. No new focal airspace opacity. No pleural effusion or pneumothorax. Upper Abdomen: No acute abnormality. Auto infarcted calcified spleen. Musculoskeletal: No acute osseous abnormality. Bony stigmata of sickle cell disease. IMPRESSION: 1. Similar extensive pulmonary parenchymal scarring, architectural distortion, and chronic ground-glass changes with a lower lung zone predominance. These findings could reflect sequela of chronic pulmonary infarcts. No new focal airspace opacity. 2. Stable mildly prominent subcarinal and axillary nodes. 3. Stable enlarged pulmonary trunk, which can be seen in the setting of pulmonary arterial hypertension. 4. Additional unchanged chronic findings, as above. Electronically  Signed   By: Harrietta Sherry M.D.   On: 10/15/2023 13:57   DG Chest 2 View Result Date: 10/15/2023 CLINICAL DATA:  Chest pain.  History of sickle cell disease. EXAM: CHEST - 2 VIEW COMPARISON:  CTA chest dated 10/12/2023. Chest radiograph dated 08/10/2023. FINDINGS: Stable cardiomediastinal silhouette. Right chest Port-A-Cath tip overlies the lower SVC. Similar bibasilar predominant interstitial coarsening with underlying haziness, corresponding to chronic ground-glass changes as noted on the prior CT. No appreciable acute focal airspace consolidation. No pleural effusion or pneumothorax. No acute osseous abnormality. IMPRESSION: Similar bibasilar predominant interstitial coarsening with underlying haziness, corresponding to chronic ground-glass changes as noted on the prior CT. No appreciable acute focal airspace consolidation. Electronically Signed   By: Harrietta Sherry M.D.   On: 10/15/2023 13:43    Scheduled Meds:   abacavir -dolutegravir -lamiVUDine   1 tablet Oral Daily   acetaminophen   650 mg Oral Once   apixaban   5 mg Oral BID   Chlorhexidine  Gluconate Cloth  6 each Topical Daily   diphenhydrAMINE   25 mg Oral Once   folic acid   0.5 mg Oral Daily   HYDROmorphone    Intravenous Q4H   hydroxyurea   2,000 mg Oral QHS   senna-docusate  1 tablet Oral BID   sodium chloride  flush  10-40 mL Intracatheter Q12H   Continuous Infusions:  Principal Problem:   Sickle cell anemia with crisis (HCC) Active Problems:   HIV (human immunodeficiency virus infection) (HCC)   History of pulmonary embolism   Generalized anxiety disorder   GERD (gastroesophageal reflux disease)   Anxiety   Leukocytosis   Anemia of chronic disease   Chronic hypoxic respiratory failure (HCC)

## 2023-11-13 NOTE — Plan of Care (Signed)

## 2023-11-14 NOTE — Progress Notes (Signed)
 SICKLE CELL SERVICE PROGRESS NOTE  Martin [name] FMW:[id number] DOB: Jan 12, 1991 DOA: 11/07/2023 PCP: [name], [name], [name]  Assessment/Plan: Principal Problem:   Sickle cell anemia with crisis (HCC) Active Problems:   HIV (human immunodeficiency virus infection) (HCC)   History of pulmonary embolism   Generalized anxiety disorder   GERD (gastroesophageal reflux disease)   Anxiety   Leukocytosis   Anemia of chronic disease   Chronic hypoxic respiratory failure (HCC)  Sickle cell anemia with crisis: Patient's pain is at 6 out of 10.  Continue with Dilaudid  PCA, continue on oxygen  and oral medications.  Continue to monitor. Anemia of chronic disease: H&H stable.  Continue to monitor oxygen .  Currently on Xarelto. Leukocytosis: Continue to monitor white count. Chronic pain syndrome: Continue chronic home medications Anxiety disorder: Continue chronic medications. Chronic hypoxic respiratory failure: On home oxygen .  Continue History of pulmonary embolism: Continue Eliquis  HIV infection: Continue home regimen Disposition: Patient will be ready for discharge tomorrow.  Code Status: Full code Family Communication: No family at bedside Disposition Plan: Home when ready  [facility]  Pager [phone]. If 7PM-7AM, please contact night-coverage.  11/14/2023, 7:24 PM  LOS: 7 days   Brief narrative: Martin [name]  is a 33 y.o. male with history of sickle cell disease,  chronic pain syndrome, opiate dependency and tolerance, history of HIV, and history of PE on Xarelto.  Patient presents to the emergency department with generalize body pain, lower back and hip pain. He is on 3l oxygen  at home.    Patient is reporting improved pain of 5/10 today.  With no new concerns.  Denies cough, fever, Nausea/vomiting/diarrhea.  No urinary symptoms.  Consultants: None  Procedures: Chest x-ray  Antibiotics: None  HPI/Subjective: Patient's pain is said to be a 6 out of 10.  No  fever or chills no nausea vomiting or diarrhea.  He has received blood transfusion.  H&H stable.  Patient likely will be discharged home in the morning.  Objective: Vitals:   11/14/23 1624 11/14/23 1625 11/14/23 1626 11/14/23 1810  BP:  106/71  95/67  Pulse:  87 99 88  Resp: 16 16 16 18   Temp:  98.4 F (36.9 C)  97.9 F (36.6 C)  TempSrc:  Oral  Oral  SpO2: 94% 91% 94% 99%  Weight:      Height:       Weight change:   Intake/Output Summary (Last 24 hours) at 11/14/2023 1924 Last data filed at 11/14/2023 1803 Gross per 24 hour  Intake 10 ml  Output 4400 ml  Net -4390 ml    General: Alert, awake, oriented x3, in no acute distress.  HEENT: [place]/AT PEERL, EOMI Neck: Trachea midline,  no masses, no thyromegal,y no JVD, no carotid bruit OROPHARYNX:  Moist, No exudate/ erythema/lesions.  Heart: Regular rate and rhythm, without murmurs, rubs, gallops, PMI non-displaced, no heaves or thrills on palpation.  Lungs: Clear to auscultation, no wheezing or rhonchi noted. No increased vocal fremitus resonant to percussion  Abdomen: Soft, nontender, nondistended, positive bowel sounds, no masses no hepatosplenomegaly noted..  Neuro: No focal neurological deficits noted cranial nerves II through XII grossly intact. DTRs 2+ bilaterally upper and lower extremities. Strength 5 out of 5 in bilateral upper and lower extremities. Musculoskeletal: No warm swelling or erythema around joints, no spinal tenderness noted. Psychiatric: Patient alert and oriented x3, good insight and cognition, good recent to remote recall. Lymph node survey: No cervical axillary or inguinal lymphadenopathy noted.   Data Reviewed: Basic Metabolic  Panel: No results for input(s): NA, K, CL, CO2, GLUCOSE, BUN, CREATININE, CALCIUM , MG, PHOS in the last 168 hours.  Liver Function Tests: No results for input(s): AST, ALT, ALKPHOS, BILITOT, PROT, ALBUMIN in the last 168 hours.  No results for  input(s): LIPASE, AMYLASE in the last 168 hours. No results for input(s): AMMONIA in the last 168 hours. CBC: Recent Labs  Lab 11/10/23 0445 11/11/23 0846 11/12/23 0448 11/13/23 0434 11/13/23 0435  WBC 8.5 11.0* 11.2* CANCELLED BY LAB 10.2  HGB 7.0* 7.1* 6.7* CANCELLED BY LAB 7.6*  HCT 17.9* 19.1* 17.8* CANCELLED BY LAB 20.1*  MCV 105.9* 103.2* 101.7* CANCELLED BY LAB 101.5*  PLT 284 326 339 CANCELLED BY LAB 416*   Cardiac Enzymes: No results for input(s): CKTOTAL, CKMB, CKMBINDEX, TROPONINI in the last 168 hours. BNP (last 3 results) No results for input(s): BNP in the last 8760 hours.  ProBNP (last 3 results) No results for input(s): PROBNP in the last 8760 hours.  CBG: No results for input(s): GLUCAP in the last 168 hours.  No results found for this or any previous visit (from the past 240 hours).   Studies: ECHOCARDIOGRAM COMPLETE Result Date: 11/08/2023    ECHOCARDIOGRAM REPORT   Patient Name:   Martin [name] Date of Exam: 11/08/2023 Medical Rec #:  [id number]           Height:       75.0 in Accession #:    [id number]          Weight:       139.0 lb Date of Birth:  [birth year]           BSA:          1.880 m Patient Age:    32 years            BP:           119/65 mmHg Patient Gender: M                   HR:           73 bpm. Exam Location:  Inpatient Procedure: 2D Echo, 3D Echo, Cardiac Doppler and Color Doppler (Both Spectral            and Color Flow Doppler were utilized during procedure). Indications:    I27.20 Pulmonary Hypertension  History:        Patient has prior history of Echocardiogram examinations, most                 recent 09/15/2022. Signs/Symptoms:Murmur, Shortness of Breath and                 Dyspnea. HIV. Sickle cell anemia.  Sonographer:    Ellouise Mose RDCS Referring Phys: [id number] [facility] E JEGEDE  Sonographer Comments: Syngo delay IMPRESSIONS  1. Left ventricular ejection fraction, by estimation, is 55 to 60%. Left ventricular  ejection fraction by 3D volume is 59 %. The left ventricle has normal function. The left ventricle has no regional wall motion abnormalities. There is mild left ventricular hypertrophy. Left ventricular diastolic parameters were normal. There is the interventricular septum is flattened in systole, consistent with right ventricular pressure overload.  2. Right ventricular systolic function is mildly reduced. The right ventricular size is moderately enlarged. There is severely elevated pulmonary artery systolic pressure. The estimated right ventricular systolic pressure is 67.3 mmHg.  3. Left atrial size was moderately dilated.  4. The mitral valve is grossly normal. Mild mitral  valve regurgitation. No evidence of mitral stenosis.  5. The aortic valve is tricuspid. Aortic valve regurgitation is not visualized. No aortic stenosis is present.  6. The inferior vena cava is dilated in size with >50% respiratory variability, suggesting right atrial pressure of 8 mmHg. Comparison(s): Prior images reviewed side by side. The pulmonary artery pressure is higher with secondary increase in right ventricular size and decrease in right ventricular systolic function. FINDINGS  Left Ventricle: Left ventricular ejection fraction, by estimation, is 55 to 60%. Left ventricular ejection fraction by 3D volume is 59 %. The left ventricle has normal function. The left ventricle has no regional wall motion abnormalities. The left ventricular internal cavity size was normal in size. There is mild left ventricular hypertrophy. The interventricular septum is flattened in systole, consistent with right ventricular pressure overload. Left ventricular diastolic parameters were normal. Right Ventricle: The right ventricular size is moderately enlarged. No increase in right ventricular wall thickness. Right ventricular systolic function is mildly reduced. There is severely elevated pulmonary artery systolic pressure. The tricuspid regurgitant  velocity is 3.85 m/s, and with an assumed right atrial pressure of 8 mmHg, the estimated right ventricular systolic pressure is 67.3 mmHg. Left Atrium: Left atrial size was moderately dilated. Right Atrium: Right atrial size was normal in size. Pericardium: There is no evidence of pericardial effusion. Mitral Valve: The mitral valve is grossly normal. Mild mitral valve regurgitation. No evidence of mitral valve stenosis. Tricuspid Valve: The tricuspid valve is normal in structure. Tricuspid valve regurgitation is mild . No evidence of tricuspid stenosis. Aortic Valve: The aortic valve is tricuspid. Aortic valve regurgitation is not visualized. No aortic stenosis is present. Pulmonic Valve: The pulmonic valve was normal in structure. Pulmonic valve regurgitation is not visualized. No evidence of pulmonic stenosis. Aorta: The aortic root and ascending aorta are structurally normal, with no evidence of dilitation. Venous: The inferior vena cava is dilated in size with greater than 50% respiratory variability, suggesting right atrial pressure of 8 mmHg. IAS/Shunts: No atrial level shunt detected by color flow Doppler. Additional Comments: 3D was performed not requiring image post processing on an independent workstation and was normal.  LEFT VENTRICLE PLAX 2D LVIDd:         4.95 cm         Diastology LVIDs:         3.00 cm         LV e' medial:    11.60 cm/s LV PW:         1.20 cm         LV E/e' medial:  8.0 LV IVS:        1.15 cm         LV e' lateral:   18.80 cm/s LVOT diam:     2.40 cm         LV E/e' lateral: 4.9 LV SV:         90 LV SV Index:   48 LVOT Area:     4.52 cm        3D Volume EF                                LV 3D EF:    Left  ventricul LV Volumes (MOD)                            ar LV vol d, MOD    148.0 ml                   ejection A2C:                                        fraction LV vol d, MOD    100.9 ml                   by 3D A4C:                                         volume is LV vol s, MOD    64.5 ml                    59 %. A2C: LV vol s, MOD    43.1 ml A4C:                           3D Volume EF: LV SV MOD A2C:   83.5 ml       3D EF:        59 % LV SV MOD A4C:   100.9 ml      LV EDV:       172 ml LV SV MOD BP:    71.4 ml       LV ESV:       70 ml                                LV SV:        102 ml RIGHT VENTRICLE             IVC RV S prime:     13.20 cm/s  IVC diam: 2.80 cm TAPSE (M-mode): 2.1 cm LEFT ATRIUM             Index        RIGHT ATRIUM           Index LA diam:        3.60 cm 1.91 cm/m   RA Area:     21.20 cm LA Vol (A2C):   59.6 ml 31.70 ml/m  RA Volume:   69.90 ml  37.18 ml/m LA Vol (A4C):   80.2 ml 42.66 ml/m LA Biplane Vol: 75.6 ml 40.21 ml/m  AORTIC VALVE LVOT Vmax:   102.00 cm/s LVOT Vmean:  65.600 cm/s LVOT VTI:    0.200 m  AORTA Ao Root diam: 3.70 cm Ao Asc diam:  3.60 cm MITRAL VALVE               TRICUSPID VALVE MV Area (PHT): 3.37 cm    TR Peak grad:   59.3 mmHg MV Decel Time: 225 msec    TR Vmax:        385.00 cm/s MV E velocity: 92.40 cm/s MV A velocity: 58.50 cm/s  SHUNTS MV E/A ratio:  1.58        Systemic VTI:  0.20 m  Systemic Diam: 2.40 cm Jerel Croitoru MD Electronically signed by Jerel Balding MD Signature Date/Time: 11/08/2023/1:22:23 PM    Final    DG Chest Portable 1 View Result Date: 11/07/2023 CLINICAL DATA:  Low O2 EXAM: PORTABLE CHEST - 1 VIEW COMPARISON:  10/27/2023 FINDINGS: Ground-glass opacities and mild interstitial prominence in a predominantly perihilar and bibasilar distribution left greater than right, slightly more conspicuous than on prior exam. Stable right IJ port catheter to the distal SVC. Heart size and mediastinal contours are within normal limits. No effusion. Visualized bones unremarkable. IMPRESSION: Slightly more conspicuous ground-glass opacities and interstitial prominence. Electronically Signed   By: JONETTA Faes M.D.   On: 11/07/2023 10:24   DG Chest Port 1  View Result Date: 10/27/2023 CLINICAL DATA:  Left-sided chest pain. EXAM: PORTABLE CHEST 1 VIEW COMPARISON:  PA Lat chest and chest CT no contrast both 10/15/2023 FINDINGS: Right chest port with IJ approach catheter again terminates in the distal SVC. Hyperinflation and scattered scarring changes of the lung fields are again noted with stable areas of architectural distortion, and chronic ground-glass disease better visualized on CT. No active lung infiltrate is seen, no substantial pleural effusion. The cardiomediastinal silhouette and vascular markings are normal. No edema is seen. Thoracic cage is intact. IMPRESSION: No evidence of acute chest disease. Chronic changes including overinflation and scarring. Electronically Signed   By: Francis Quam M.D.   On: 10/27/2023 00:23    Scheduled Meds:  abacavir -dolutegravir -lamiVUDine   1 tablet Oral Daily   acetaminophen   650 mg Oral Once   apixaban   5 mg Oral BID   Chlorhexidine  Gluconate Cloth  6 each Topical Daily   diphenhydrAMINE   25 mg Oral Once   folic acid   0.5 mg Oral Daily   HYDROmorphone    Intravenous Q4H   hydroxyurea   2,000 mg Oral QHS   senna-docusate  1 tablet Oral BID   sodium chloride  flush  10-40 mL Intracatheter Q12H   Continuous Infusions:  Principal Problem:   Sickle cell anemia with crisis (HCC) Active Problems:   HIV (human immunodeficiency virus infection) (HCC)   History of pulmonary embolism   Generalized anxiety disorder   GERD (gastroesophageal reflux disease)   Anxiety   Leukocytosis   Anemia of chronic disease   Chronic hypoxic respiratory failure (HCC)

## 2023-11-14 NOTE — Plan of Care (Signed)

## 2023-11-15 ENCOUNTER — Other Ambulatory Visit (HOSPITAL_COMMUNITY): Payer: Self-pay

## 2023-11-15 LAB — TYPE AND SCREEN
ABO/RH(D): O POS
Antibody Screen: NEGATIVE
Unit division: 0
Unit division: 0

## 2023-11-15 LAB — BPAM RBC
Blood Product Expiration Date: 202508202359
Blood Product Expiration Date: 202508232359
ISSUE DATE / TIME: 202507251442
ISSUE DATE / TIME: 202507260420
Unit Type and Rh: 5100
Unit Type and Rh: 9500

## 2023-11-15 MED ORDER — HEPARIN SOD (PORK) LOCK FLUSH 100 UNIT/ML IV SOLN
500.0000 [IU] | INTRAVENOUS | Status: AC | PRN
  Administered 2023-11-15: 500 [IU]
  Filled 2023-11-15: qty 5

## 2023-11-15 NOTE — Discharge Summary (Signed)
 Physician Discharge Summary  Martin [name] FMW:[id number] DOB: 03-Feb-1991 DOA: 11/07/2023  PCP: [name], [name], [name]  Admit date: 11/07/2023  Discharge date: 11/15/2023  Discharge Diagnoses:  Principal Problem:   Sickle cell anemia with crisis [facility]) Active Problems:   Generalized anxiety disorder   GERD (gastroesophageal reflux disease)   HIV (human immunodeficiency virus infection) (HCC)   History of pulmonary embolism   Anxiety   Leukocytosis   Anemia of chronic disease   Chronic hypoxic respiratory failure (HCC)   Discharge Condition: Stable  Disposition:   Follow-up Information     [facility]. Call.   Why: Please follow up with [facility] DSS to begin process for applying for [facility] Medicaid and any additional community resources. Contact information: Located in: [facility] Vital Records Address: [address], [place], [place] [address] Phone: [phone]               Pt is discharged home in good condition and is to follow up with [name], [name], [name] this week to have labs evaluated. Martin [name] is instructed to increase activity slowly and balance with rest for the next few days, and use prescribed medication to complete treatment of pain  Diet: Regular Wt Readings from Last 3 Encounters:  11/07/23 63 kg  11/03/23 63 kg  10/28/23 64.4 kg    History of present illness:  Martin [name]  is a 33 y.o. male with a medical history significant for sickle cell anemia with history of acute chest syndrome, multiple sickle cell crisis, multiple ED visits and hospital admission, history of PE currently on Eliquis , chronic hypoxic respiratory failure on home oxygen , HIV infection and generalized anxiety disorder who presented to the emergency room  with major complaints of generalized body pain that is consistent with his typical sickle cell pain crisis.  Patient was recently admitted for the same complaints and discharged  on 10/29/2023, he has since been to the ED again between then and this visit.  Patient claims his pain is mostly in his hip joints, lower back and lower extremities.  At present, patient has no shortness of breath, no cough or chest pain.  He endorses adherence with his home medications including Eliquis .  He reports good oral intake.  He has also recently noticed increasing yellowness of his eyes, no blurry vision or double vision.  He denies any fever, headache, dizziness, nausea, vomiting or diarrhea.  No recent travels, and no sick contacts.   ED course: Vital signs showed: BP 117/79 (BP Location: Right Arm)  Pulse (!) 108  Temp 98.5 F (36.9 C)  Resp 18  Ht 6' 3 (1.905 m)  Wt 63 kg  SpO2 (!) 80%  BMI 17.37 kg/m. Patient was in no acute distress, resting comfortably.  Available lab results showed elevated total bilirubin to 11.1, has been trending up for the past 3 weeks, was 4.53 weeks ago and 9.55 days ago, otherwise normal CMP.  CBC showed elevated WBC count at 17.2, hemoglobin 8.2 which is consistent with his baseline and normal platelet count at 352.  He has a robust reticulocytosis at 26.4%.  Chest x-ray showed more prominent groundglass opacities but no acute infiltrate.    Patient received multiple doses of IV Dilaudid , and other adjunct therapy per Sickle cell management protocol with no significant relief of pain.  Patient will be admitted to the hospital for further evaluation and management of sickle cell pain crisis.  Hospital Course:  Patient was admitted for  sickle cell pain crisis and managed appropriately with IVF, IV Dilaudid  via PCA and IV Toradol , as well as other adjunct therapies per sickle cell pain management protocols. Patient patient was found to have a low hemoglobin while on admission and received 1 unit packed red blood cells.  Hemoglobin improved to 7.6 g/dL at patient's baseline.  Completed transfusion without reactions.  He is walking without assistance,  tolerating p.o. well without nausea or vomiting.  Reporting significant improvement to pain at baseline. Patient was therefore discharged home today in a hemodynamically stable condition.   Jarryn was counseled extensively about nonpharmacologic means of pain management, patient verbalized understanding and was appreciative of  the care received during this admission.   We discussed the need for good hydration, monitoring of hydration status, avoidance of heat, cold, stress, and infection triggers. We discussed the need to be adherent with taking home medications. Patient was reminded of the need to seek medical attention immediately if any symptom of bleeding, anemia, or infection occurs.  Discharge Exam: Vitals:   11/15/23 0731 11/15/23 0956  BP:  110/78  Pulse:  100  Resp: 20 18  Temp:  97.8 F (36.6 C)  SpO2: 91% 92%   Vitals:   11/15/23 0600 11/15/23 0621 11/15/23 0731 11/15/23 0956  BP: 120/80 110/71  110/78  Pulse: (!) 101 93  100  Resp: (!) 21 20 20 18   Temp: 98.7 F (37.1 C) 97.7 F (36.5 C)  97.8 F (36.6 C)  TempSrc: Oral Oral  Oral  SpO2: (!) 86% 91% 91% 92%  Weight:      Height:        General appearance : Awake, alert, not in any distress. Speech Clear. Not toxic looking HEENT: Atraumatic and Normocephalic, pupils equally reactive to light and accomodation Neck: Supple, no JVD. No cervical lymphadenopathy.  Chest: Good air entry bilaterally, no added sounds  CVS: S1 S2 regular, no murmurs.  Abdomen: Bowel sounds present, Non tender and not distended with no gaurding, rigidity or rebound. Extremities: B/L Lower Ext shows no edema, both legs are warm to touch Neurology: Awake alert, and oriented X 3, CN II-XII intact, Non focal Skin: No Rash  Discharge Instructions  Discharge Instructions     Call MD for:  severe uncontrolled pain   Complete by: As directed    Call MD for:  temperature >100.4   Complete by: As directed    Diet - low sodium heart healthy    Complete by: As directed    Increase activity slowly   Complete by: As directed       Allergies as of 11/15/2023       Reactions   Ketorolac  Tromethamine  Swelling, Other (See Comments)   Patient reports facial edema and left arm edema after administration.   Tape Rash, Other (See Comments)   PLEASE DO NOT USE THE CLEAR, THICK, PLASTIC TAPE- only paper tape is tolerated    Wound Dressing Adhesive Rash        Medication List     TAKE these medications    albuterol  108 (90 Base) MCG/ACT inhaler Commonly known as: VENTOLIN  HFA Inhale 2 puffs into the lungs every 6 (six) hours as needed for wheezing or shortness of breath.   cyclobenzaprine  10 MG tablet Commonly known as: FLEXERIL  Take 10 mg by mouth 2 (two) times daily as needed for muscle spasms.   Deferasirox  360 MG Tabs Commonly known as: Jadenu  Take 2 tablets (720 mg total) by mouth daily.  diphenhydrAMINE  25 MG tablet Commonly known as: BENADRYL  Take 1 tablet (25 mg total) by mouth every 4 (four) hours as needed for itching.   Eliquis  5 MG Tabs tablet Generic drug: apixaban  Take 1 tablet (5 mg total) by mouth 2 (two) times daily.   folic acid  400 MCG tablet Commonly known as: FOLVITE  Take 1 tablet (400 mcg total) by mouth daily.   gabapentin  300 MG capsule Commonly known as: NEURONTIN  Take 1 capsule (300 mg total) by mouth at bedtime as needed (nerve pain).   guaiFENesin  600 MG 12 hr tablet Commonly known as: Mucinex  Take 1 tablet (600 mg total) by mouth 2 (two) times daily.   hydroxyurea  500 MG capsule Commonly known as: HYDREA  Take 4 capsules (2,000 mg total) by mouth at bedtime.   Narcan  4 MG/0.1ML Liqd nasal spray kit Generic drug: naloxone  Place 1 spray into the nose as needed (respiratory distress / opioid overdose).   oxyCODONE -acetaminophen  10-325 MG tablet Commonly known as: PERCOCET Take 1 tablet by mouth every 4 (four) hours as needed for pain.   pantoprazole  40 MG tablet Commonly  known as: PROTONIX  Take 1 tablet (40 mg total) by mouth daily as needed (acid reflux).   senna-docusate 8.6-50 MG tablet Commonly known as: Senokot-S Take 2 tablets by mouth at bedtime.   triamcinolone  cream 0.1 % Commonly known as: KENALOG  Apply 1 Application topically 2 (two) times daily.   Triumeq  600-50-300 MG tablet Generic drug: abacavir -dolutegravir -lamiVUDine  Take 1 tablet by mouth daily.   Vitamin D  (Ergocalciferol ) 1.25 MG (50000 UNIT) Caps capsule Commonly known as: DRISDOL  Take 1 capsule (50,000 Units total) by mouth every 7 (seven) days. What changed: Another medication with the same name was added. Make sure you understand how and when to take each.   Vitamin D  (Ergocalciferol ) 1.25 MG (50000 UNIT) Caps capsule Commonly known as: DRISDOL  Take 1 capsule (50,000 Units total) by mouth every 7 (seven) days. What changed: You were already taking a medication with the same name, and this prescription was added. Make sure you understand how and when to take each.        The results of significant diagnostics from this hospitalization (including imaging, microbiology, ancillary and laboratory) are listed below for reference.    Significant Diagnostic Studies: ECHOCARDIOGRAM COMPLETE Result Date: 11/08/2023    ECHOCARDIOGRAM REPORT   Patient Name:   Martin [name] Date of Exam: 11/08/2023 Medical Rec #:  [id number]           Height:       75.0 in Accession #:    [id number]          Weight:       139.0 lb Date of Birth:  1991-03-14           BSA:          1.880 m Patient Age:    32 years            BP:           119/65 mmHg Patient Gender: M                   HR:           73 bpm. Exam Location:  Inpatient Procedure: 2D Echo, 3D Echo, Cardiac Doppler and Color Doppler (Both Spectral            and Color Flow Doppler were utilized during procedure). Indications:    I27.20 Pulmonary Hypertension  History:  Patient has prior history of Echocardiogram examinations, most                  recent 09/15/2022. Signs/Symptoms:Murmur, Shortness of Breath and                 Dyspnea. HIV. Sickle cell anemia.  Sonographer:    Ellouise Mose RDCS Referring Phys: [id number] [facility] E JEGEDE  Sonographer Comments: Syngo delay IMPRESSIONS  1. Left ventricular ejection fraction, by estimation, is 55 to 60%. Left ventricular ejection fraction by 3D volume is 59 %. The left ventricle has normal function. The left ventricle has no regional wall motion abnormalities. There is mild left ventricular hypertrophy. Left ventricular diastolic parameters were normal. There is the interventricular septum is flattened in systole, consistent with right ventricular pressure overload.  2. Right ventricular systolic function is mildly reduced. The right ventricular size is moderately enlarged. There is severely elevated pulmonary artery systolic pressure. The estimated right ventricular systolic pressure is 67.3 mmHg.  3. Left atrial size was moderately dilated.  4. The mitral valve is grossly normal. Mild mitral valve regurgitation. No evidence of mitral stenosis.  5. The aortic valve is tricuspid. Aortic valve regurgitation is not visualized. No aortic stenosis is present.  6. The inferior vena cava is dilated in size with >50% respiratory variability, suggesting right atrial pressure of 8 mmHg. Comparison(s): Prior images reviewed side by side. The pulmonary artery pressure is higher with secondary increase in right ventricular size and decrease in right ventricular systolic function. FINDINGS  Left Ventricle: Left ventricular ejection fraction, by estimation, is 55 to 60%. Left ventricular ejection fraction by 3D volume is 59 %. The left ventricle has normal function. The left ventricle has no regional wall motion abnormalities. The left ventricular internal cavity size was normal in size. There is mild left ventricular hypertrophy. The interventricular septum is flattened in systole, consistent with right ventricular  pressure overload. Left ventricular diastolic parameters were normal. Right Ventricle: The right ventricular size is moderately enlarged. No increase in right ventricular wall thickness. Right ventricular systolic function is mildly reduced. There is severely elevated pulmonary artery systolic pressure. The tricuspid regurgitant velocity is 3.85 m/s, and with an assumed right atrial pressure of 8 mmHg, the estimated right ventricular systolic pressure is 67.3 mmHg. Left Atrium: Left atrial size was moderately dilated. Right Atrium: Right atrial size was normal in size. Pericardium: There is no evidence of pericardial effusion. Mitral Valve: The mitral valve is grossly normal. Mild mitral valve regurgitation. No evidence of mitral valve stenosis. Tricuspid Valve: The tricuspid valve is normal in structure. Tricuspid valve regurgitation is mild . No evidence of tricuspid stenosis. Aortic Valve: The aortic valve is tricuspid. Aortic valve regurgitation is not visualized. No aortic stenosis is present. Pulmonic Valve: The pulmonic valve was normal in structure. Pulmonic valve regurgitation is not visualized. No evidence of pulmonic stenosis. Aorta: The aortic root and ascending aorta are structurally normal, with no evidence of dilitation. Venous: The inferior vena cava is dilated in size with greater than 50% respiratory variability, suggesting right atrial pressure of 8 mmHg. IAS/Shunts: No atrial level shunt detected by color flow Doppler. Additional Comments: 3D was performed not requiring image post processing on an independent workstation and was normal.  LEFT VENTRICLE PLAX 2D LVIDd:         4.95 cm         Diastology LVIDs:         3.00 cm  LV e' medial:    11.60 cm/s LV PW:         1.20 cm         LV E/e' medial:  8.0 LV IVS:        1.15 cm         LV e' lateral:   18.80 cm/s LVOT diam:     2.40 cm         LV E/e' lateral: 4.9 LV SV:         90 LV SV Index:   48 LVOT Area:     4.52 cm        3D Volume  EF                                LV 3D EF:    Left                                             ventricul LV Volumes (MOD)                            ar LV vol d, MOD    148.0 ml                   ejection A2C:                                        fraction LV vol d, MOD    100.9 ml                   by 3D A4C:                                        volume is LV vol s, MOD    64.5 ml                    59 %. A2C: LV vol s, MOD    43.1 ml A4C:                           3D Volume EF: LV SV MOD A2C:   83.5 ml       3D EF:        59 % LV SV MOD A4C:   100.9 ml      LV EDV:       172 ml LV SV MOD BP:    71.4 ml       LV ESV:       70 ml                                LV SV:        102 ml RIGHT VENTRICLE             IVC RV S prime:     13.20 cm/s  IVC diam: 2.80 cm TAPSE (M-mode): 2.1 cm LEFT ATRIUM  Index        RIGHT ATRIUM           Index LA diam:        3.60 cm 1.91 cm/m   RA Area:     21.20 cm LA Vol (A2C):   59.6 ml 31.70 ml/m  RA Volume:   69.90 ml  37.18 ml/m LA Vol (A4C):   80.2 ml 42.66 ml/m LA Biplane Vol: 75.6 ml 40.21 ml/m  AORTIC VALVE LVOT Vmax:   102.00 cm/s LVOT Vmean:  65.600 cm/s LVOT VTI:    0.200 m  AORTA Ao Root diam: 3.70 cm Ao Asc diam:  3.60 cm MITRAL VALVE               TRICUSPID VALVE MV Area (PHT): 3.37 cm    TR Peak grad:   59.3 mmHg MV Decel Time: 225 msec    TR Vmax:        385.00 cm/s MV E velocity: 92.40 cm/s MV A velocity: 58.50 cm/s  SHUNTS MV E/A ratio:  1.58        Systemic VTI:  0.20 m                            Systemic Diam: 2.40 cm Mihai Croitoru MD Electronically signed by Jerel Balding MD Signature Date/Time: 11/08/2023/1:22:23 PM    Final    DG Chest Portable 1 View Result Date: 11/07/2023 CLINICAL DATA:  Low O2 EXAM: PORTABLE CHEST - 1 VIEW COMPARISON:  10/27/2023 FINDINGS: Ground-glass opacities and mild interstitial prominence in a predominantly perihilar and bibasilar distribution left greater than right, slightly more conspicuous than on prior exam.  Stable right IJ port catheter to the distal SVC. Heart size and mediastinal contours are within normal limits. No effusion. Visualized bones unremarkable. IMPRESSION: Slightly more conspicuous ground-glass opacities and interstitial prominence. Electronically Signed   By: JONETTA Faes M.D.   On: 11/07/2023 10:24   DG Chest Port 1 View Result Date: 10/27/2023 CLINICAL DATA:  Left-sided chest pain. EXAM: PORTABLE CHEST 1 VIEW COMPARISON:  PA Lat chest and chest CT no contrast both 10/15/2023 FINDINGS: Right chest port with IJ approach catheter again terminates in the distal SVC. Hyperinflation and scattered scarring changes of the lung fields are again noted with stable areas of architectural distortion, and chronic ground-glass disease better visualized on CT. No active lung infiltrate is seen, no substantial pleural effusion. The cardiomediastinal silhouette and vascular markings are normal. No edema is seen. Thoracic cage is intact. IMPRESSION: No evidence of acute chest disease. Chronic changes including overinflation and scarring. Electronically Signed   By: Francis Quam M.D.   On: 10/27/2023 00:23    Microbiology: No results found for this or any previous visit (from the past 240 hours).   Labs: Basic Metabolic Panel: No results for input(s): NA, K, CL, CO2, GLUCOSE, BUN, CREATININE, CALCIUM , MG, PHOS in the last 168 hours. Liver Function Tests: No results for input(s): AST, ALT, ALKPHOS, BILITOT, PROT, ALBUMIN in the last 168 hours. No results for input(s): LIPASE, AMYLASE in the last 168 hours. No results for input(s): AMMONIA in the last 168 hours. CBC: Recent Labs  Lab 11/10/23 0445 11/11/23 0846 11/12/23 0448 11/13/23 0434 11/13/23 0435  WBC 8.5 11.0* 11.2* CANCELLED BY LAB 10.2  HGB 7.0* 7.1* 6.7* CANCELLED BY LAB 7.6*  HCT 17.9* 19.1* 17.8* CANCELLED BY LAB 20.1*  MCV 105.9* 103.2* 101.7* CANCELLED BY LAB 101.5*  PLT  284 326 339 CANCELLED BY  LAB 416*   Cardiac Enzymes: No results for input(s): CKTOTAL, CKMB, CKMBINDEX, TROPONINI in the last 168 hours. BNP: Invalid input(s): POCBNP CBG: No results for input(s): GLUCAP in the last 168 hours.  Time coordinating discharge: 50 minutes  Signed:  [name] Cover NP  Delete 11/15/2023, 4:41 PM

## 2023-11-15 NOTE — Plan of Care (Signed)
  Problem: Education: Goal: Knowledge of General Education information will improve Description: Including pain rating scale, medication(s)/side effects and non-pharmacologic comfort measures Outcome: Progressing   Problem: Health Behavior/Discharge Planning: Goal: Ability to manage health-related needs will improve Outcome: Progressing   Problem: Clinical Measurements: Goal: Will remain free from infection Outcome: Progressing   Problem: Activity: Goal: Risk for activity intolerance will decrease Outcome: Progressing   Problem: Coping: Goal: Level of anxiety will decrease Outcome: Progressing   Problem: Elimination: Goal: Will not experience complications related to bowel motility Outcome: Progressing   

## 2023-11-16 ENCOUNTER — Telehealth: Payer: Self-pay

## 2023-11-16 ENCOUNTER — Other Ambulatory Visit: Payer: Self-pay | Admitting: Nurse Practitioner

## 2023-11-16 ENCOUNTER — Other Ambulatory Visit (HOSPITAL_COMMUNITY): Payer: Self-pay

## 2023-11-16 DIAGNOSIS — F1190 Opioid use, unspecified, uncomplicated: Secondary | ICD-10-CM

## 2023-11-16 DIAGNOSIS — D571 Sickle-cell disease without crisis: Secondary | ICD-10-CM

## 2023-11-16 MED ORDER — OXYCODONE-ACETAMINOPHEN 10-325 MG PO TABS
1.0000 | ORAL_TABLET | ORAL | 0 refills | Status: DC | PRN
  Filled 2023-11-18: qty 90, 15d supply, fill #0
  Filled ????-??-??: fill #0

## 2023-11-16 NOTE — Transitions of Care (Post Inpatient/ED Visit) (Signed)
   11/16/2023  Name: Martin [name] MRN: [id number] DOB: [birth year]  Today's TOC FU Call Status: Today's TOC FU Call Status:: Unsuccessful Call (1st Attempt) Unsuccessful Call (1st Attempt) Date: 11/16/23  Attempted to reach the patient regarding the most recent Inpatient/ED visit.  Follow Up Plan: Additional outreach attempts will be made to reach the patient to complete the Transitions of Care (Post Inpatient/ED visit) call.   Arvin Seip RN, BSN, CCM [employer], Population Health Case Manager Phone: [phone]

## 2023-11-16 NOTE — Telephone Encounter (Unsigned)
 Copied from CRM [id number]. Topic: Clinical - Medication Refill >> Nov 16, 2023  2:36 PM Wess RAMAN wrote: Medication: oxyCODONE -acetaminophen  (PERCOCET) 10-325 MG tablet   Has the patient contacted their pharmacy? Yes (Agent: If no, request that the patient contact the pharmacy for the refill. If patient does not wish to contact the pharmacy document the reason why and proceed with request.) (Agent: If yes, when and what did the pharmacy advise?)  This is the patient's preferred pharmacy:  [facility] [address] [place] [address] Phone: [phone] Fax: [phone]  Is this the correct pharmacy for this prescription? Yes If no, delete pharmacy and type the correct one.   Has the prescription been filled recently? Yes  Is the patient out of the medication? Yes  Has the patient been seen for an appointment in the last year OR does the patient have an upcoming appointment? Yes  Can we respond through MyChart? Yes  Agent: Please be advised that Rx refills may take up to 3 business days. We ask that you follow-up with your pharmacy.

## 2023-11-16 NOTE — Progress Notes (Signed)
 Reviewed PDMP substance reporting system prior to prescribing opiate medications. No inconsistencies noted.   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  Dispense: 90 tablet; Refill: 0  2. Chronic, continuous use of opioids  - oxyCODONE -acetaminophen  (PERCOCET) 10-325 MG tablet; Take 1 tablet by mouth every 4 (four) hours as needed for pain.  Dispense: 90 tablet; Refill: 0

## 2023-11-17 ENCOUNTER — Telehealth: Payer: Self-pay | Admitting: *Deleted

## 2023-11-17 NOTE — Transitions of Care (Post Inpatient/ED Visit) (Signed)
   11/17/2023  Name: Martin [name] MRN: [id number] DOB: 1990-05-08  Today's TOC FU Call Status: Today's TOC FU Call Status:: Unsuccessful Call (2nd Attempt) Unsuccessful Call (2nd Attempt) Date: 11/17/23  Attempted to reach the patient regarding the most recent Inpatient/ED visit.  Follow Up Plan: Additional outreach attempts will be made to reach the patient to complete the Transitions of Care (Post Inpatient/ED visit) call.   Cathlean Headland BSN RN [facility] Health Care Management Coordinator [email or web address] Direct Dial: [phone]  Fax: [phone] Website: [email or web address]

## 2023-11-17 NOTE — Telephone Encounter (Signed)
 This encounter was created in error - please disregard.

## 2023-11-18 ENCOUNTER — Telehealth: Payer: Self-pay

## 2023-11-18 ENCOUNTER — Other Ambulatory Visit (HOSPITAL_COMMUNITY): Payer: Self-pay

## 2023-11-18 ENCOUNTER — Other Ambulatory Visit: Payer: Self-pay

## 2023-11-18 NOTE — Transitions of Care (Post Inpatient/ED Visit) (Signed)
   11/18/2023  Name: [name] MRN: [id number] DOB: 1990-08-17  Today's TOC FU Call Status: Today's TOC FU Call Status:: Unsuccessful Call (3rd Attempt) Unsuccessful Call (3rd Attempt) Date: 11/18/23  Attempted to reach the patient regarding the most recent Inpatient/ED visit.  Follow Up Plan: No further outreach attempts will be made at this time. We have been unable to contact the patient.  Siera Beyersdorf J. Zyaire Dumas RN, [name] [facility], [facility] RN Care Manager Direct Dial: [phone]  Fax: [phone] Website: [email or web address]

## 2023-11-22 ENCOUNTER — Other Ambulatory Visit: Payer: Self-pay

## 2023-11-22 ENCOUNTER — Emergency Department (HOSPITAL_COMMUNITY)
Admission: EM | Admit: 2023-11-22 | Discharge: 2023-11-22 | Disposition: A | Discharge status: Home / Self Care | Payer: Self-pay | Attending: Emergency Medicine | Admitting: Emergency Medicine

## 2023-11-22 ENCOUNTER — Encounter (HOSPITAL_COMMUNITY): Payer: Self-pay

## 2023-11-22 DIAGNOSIS — Z7901 Long term (current) use of anticoagulants: Secondary | ICD-10-CM | POA: Insufficient documentation

## 2023-11-22 DIAGNOSIS — D72829 Elevated white blood cell count, unspecified: Secondary | ICD-10-CM | POA: Insufficient documentation

## 2023-11-22 DIAGNOSIS — D5700 Hb-SS disease with crisis, unspecified: Secondary | ICD-10-CM | POA: Insufficient documentation

## 2023-11-22 LAB — CBC WITH DIFFERENTIAL/PLATELET
Abs Immature Granulocytes: 0.14 K/uL — ABNORMAL HIGH (ref 0.00–0.07)
Basophils Absolute: 0.1 K/uL (ref 0.0–0.1)
Basophils Relative: 0 %
Eosinophils Absolute: 0.2 K/uL (ref 0.0–0.5)
Eosinophils Relative: 1 %
HCT: 23.1 % — ABNORMAL LOW (ref 39.0–52.0)
Hemoglobin: 8.1 g/dL — ABNORMAL LOW (ref 13.0–17.0)
Immature Granulocytes: 1 %
Lymphocytes Relative: 34 %
Lymphs Abs: 4.4 K/uL — ABNORMAL HIGH (ref 0.7–4.0)
MCH: 39.3 pg — ABNORMAL HIGH (ref 26.0–34.0)
MCHC: 35.1 g/dL (ref 30.0–36.0)
MCV: 112.1 fL — ABNORMAL HIGH (ref 80.0–100.0)
Monocytes Absolute: 1 K/uL (ref 0.1–1.0)
Monocytes Relative: 8 %
Neutro Abs: 7.4 K/uL (ref 1.7–7.7)
Neutrophils Relative %: 56 %
Platelets: 306 K/uL (ref 150–400)
RBC: 2.06 MIL/uL — ABNORMAL LOW (ref 4.22–5.81)
RDW: 26.5 % — ABNORMAL HIGH (ref 11.5–15.5)
WBC: 13.2 K/uL — ABNORMAL HIGH (ref 4.0–10.5)
nRBC: 139.6 % — ABNORMAL HIGH (ref 0.0–0.2)

## 2023-11-22 LAB — COMPREHENSIVE METABOLIC PANEL WITH GFR
ALT: 17 U/L (ref 0–44)
AST: 23 U/L (ref 15–41)
Albumin: 3.1 g/dL — ABNORMAL LOW (ref 3.5–5.0)
Alkaline Phosphatase: 52 U/L (ref 38–126)
Anion gap: 8 (ref 5–15)
BUN: 11 mg/dL (ref 6–20)
CO2: 20 mmol/L — ABNORMAL LOW (ref 22–32)
Calcium: 7.5 mg/dL — ABNORMAL LOW (ref 8.9–10.3)
Chloride: 111 mmol/L (ref 98–111)
Creatinine, Ser: 0.83 mg/dL (ref 0.61–1.24)
GFR, Estimated: >60 mL/min (ref >60)
Glucose, Bld: 84 mg/dL (ref 70–99)
Potassium: 3.4 mmol/L — ABNORMAL LOW (ref 3.5–5.1)
Sodium: 139 mmol/L (ref 135–145)
Total Bilirubin: 6.3 mg/dL — ABNORMAL HIGH (ref 0.0–1.2)
Total Protein: 6.9 g/dL (ref 6.5–8.1)

## 2023-11-22 LAB — RETICULOCYTES
Immature Retic Fract: 30.1 % — ABNORMAL HIGH (ref 2.3–15.9)
RBC.: 1.83 MIL/uL — ABNORMAL LOW (ref 4.22–5.81)
Retic Count, Absolute: 456.9 K/uL — ABNORMAL HIGH (ref 19.0–186.0)
Retic Ct Pct: 25 % — ABNORMAL HIGH (ref 0.4–3.1)

## 2023-11-22 MED ORDER — SODIUM CHLORIDE 0.9 % IV SOLN
12.5000 mg | Freq: Once | INTRAVENOUS | Status: AC
  Administered 2023-11-22: 12.5 mg via INTRAVENOUS
  Filled 2023-11-22: qty 12.5

## 2023-11-22 MED ORDER — HYDROMORPHONE HCL 1 MG/ML IJ SOLN
2.0000 mg | INTRAMUSCULAR | Status: AC
  Administered 2023-11-22: 2 mg via INTRAVENOUS
  Filled 2023-11-22: qty 2

## 2023-11-22 MED ORDER — ONDANSETRON HCL 4 MG/2ML IJ SOLN
4.0000 mg | INTRAMUSCULAR | Status: DC | PRN
  Filled 2023-11-22: qty 2

## 2023-11-22 MED ORDER — SODIUM CHLORIDE 0.45 % IV SOLN: INTRAVENOUS | Status: DC

## 2023-11-22 MED ORDER — HEPARIN SOD (PORK) LOCK FLUSH 100 UNIT/ML IV SOLN
500.0000 [IU] | Freq: Once | INTRAVENOUS | Status: AC
  Administered 2023-11-22: 500 [IU]
  Filled 2023-11-22: qty 5

## 2023-11-22 NOTE — ED Provider Notes (Signed)
 [place] EMERGENCY DEPARTMENT AT [facility] Provider Note   CSN: [id number] Arrival date & time: 11/22/23  9674    Patient presents with: Sickle Cell Pain Crisis   Martin [name] is a 33 y.o. male hx of multiple medical problems, chronic hypoxic respiratory failure here for pain all over consistent with prior sickle cell crisis. Started last night. Pain all over. No cp, sob, abd pain, N/V/D, fever.  On his home 3 L via nasal cannula. Compliant with medications.  No recent falls or injuries.     HPI     Prior to Admission medications   Medication Sig Start Date End Date Taking? Authorizing Provider  abacavir -dolutegravir -lamiVUDine  (TRIUMEQ ) 600-50-300 MG tablet Take 1 tablet by mouth daily. 08/13/23   Pearlean Manus, MD  albuterol  (VENTOLIN  HFA) 108 (90 Base) MCG/ACT inhaler Inhale 2 puffs into the lungs every 6 (six) hours as needed for wheezing or shortness of breath. Patient not taking: Reported on 10/20/2023 08/13/23   Pearlean Manus, MD  apixaban  (ELIQUIS ) 5 MG TABS tablet Take 1 tablet (5 mg total) by mouth 2 (two) times daily. 08/13/23   Pearlean Manus, MD  cyclobenzaprine  (FLEXERIL ) 10 MG tablet Take 10 mg by mouth 2 (two) times daily as needed for muscle spasms. 12/22/22   [provider]  Deferasirox  (JADENU ) 360 MG TABS Take 2 tablets (720 mg total) by mouth daily. 11/05/23   [name], [name], [name]  diphenhydrAMINE  (BENADRYL ) 25 MG tablet Take 1 tablet (25 mg total) by mouth every 4 (four) hours as needed for itching. 08/13/23   Pearlean Manus, MD  folic acid  (FOLVITE ) 400 MCG tablet Take 1 tablet (400 mcg total) by mouth daily. 08/13/23   Pearlean Manus, MD  gabapentin  (NEURONTIN ) 300 MG capsule Take 1 capsule (300 mg total) by mouth at bedtime as needed (nerve pain). 08/13/23   Pearlean Manus, MD  guaiFENesin  (MUCINEX ) 600 MG 12 hr tablet Take 1 tablet (600 mg total) by mouth 2 (two) times daily. Patient not taking: Reported on 10/20/2023  08/13/23 08/12/24  Pearlean Manus, MD  hydroxyurea  (HYDREA ) 500 MG capsule Take 4 capsules (2,000 mg total) by mouth at bedtime. 08/13/23   Pearlean Manus, MD  NARCAN  4 MG/0.1ML LIQD nasal spray kit Place 1 spray into the nose as needed (respiratory distress / opioid overdose). 01/07/23   [provider]  oxyCODONE -acetaminophen  (PERCOCET) 10-325 MG tablet Take 1 tablet by mouth every 4 (four) hours as needed for pain. 11/18/23   [name], [name], [name]  pantoprazole  (PROTONIX ) 40 MG tablet Take 1 tablet (40 mg total) by mouth daily as needed (acid reflux). Patient not taking: Reported on 10/20/2023 08/13/23   Pearlean Manus, MD  senna-docusate (SENOKOT-S) 8.6-50 MG tablet Take 2 tablets by mouth at bedtime. Patient not taking: Reported on 10/20/2023 08/13/23   Pearlean Manus, MD  triamcinolone  cream (KENALOG ) 0.1 % Apply 1 Application topically 2 (two) times daily. Patient not taking: Reported on 10/27/2023 01/12/23   Tilford Bertram HERO, [name]  Vitamin D , Ergocalciferol , (DRISDOL ) 1.25 MG (50000 UNIT) CAPS capsule Take 1 capsule (50,000 Units total) by mouth every 7 (seven) days. 11/05/23   [name], [name], [name]  Vitamin D , Ergocalciferol , (DRISDOL ) 1.25 MG (50000 UNIT) CAPS capsule Take 1 capsule (50,000 Units total) by mouth every 7 (seven) days. 11/09/23   [name], [name], [name]    Allergies: Ketorolac  tromethamine , Tape, and Wound dressing adhesive    Review of Systems  Constitutional: Negative.   HENT: Negative.  Respiratory: Negative.    Cardiovascular: Negative.   Gastrointestinal: Negative.   Genitourinary: Negative.   Musculoskeletal:  Positive for myalgias.  Skin: Negative.   Neurological: Negative.   All other systems reviewed and are negative.   Updated Vital Signs BP 116/69 (BP Location: Left Arm)   Pulse 78   Temp 97.7 F (36.5 C) (Oral)   Resp 12   Ht 6' 3 (1.905 m)   Wt 63.5 kg   SpO2 91%   BMI 17.50 kg/m   Physical Exam Vitals and nursing note  reviewed.  Constitutional:      General: He is not in acute distress.    Appearance: He is well-developed. He is not ill-appearing, toxic-appearing or diaphoretic.  HENT:     Head: Normocephalic and atraumatic.     Nose: Nose normal.     Mouth/Throat:     Mouth: Mucous membranes are moist.  Eyes:     Pupils: Pupils are equal, round, and reactive to light.  Cardiovascular:     Rate and Rhythm: Normal rate and regular rhythm.     Pulses: Normal pulses.          Radial pulses are 2+ on the right side and 2+ on the left side.       Dorsalis pedis pulses are 2+ on the right side and 2+ on the left side.     Heart sounds: Normal heart sounds.  Pulmonary:     Effort: Pulmonary effort is normal. No respiratory distress.     Breath sounds: Normal breath sounds and air entry.     Comments: Clear bilaterally, speaks in full sentences wo difficulty. 3 L [place] at baseline Abdominal:     General: Bowel sounds are normal. There is no distension.     Palpations: Abdomen is soft.     Tenderness: There is no abdominal tenderness. There is no guarding or rebound.  Musculoskeletal:        General: Normal range of motion.     Cervical back: Normal range of motion and neck supple.     Comments: No bony tenderness compartment soft, full range of motion  Skin:    General: Skin is warm and dry.  Neurological:     General: No focal deficit present.     Mental Status: He is alert and oriented to person, place, and time.     (all labs ordered are listed, but only abnormal results are displayed) Labs Reviewed  COMPREHENSIVE METABOLIC PANEL WITH GFR - Abnormal; Notable for the following components:      Result Value   Potassium 3.4 (*)    CO2 20 (*)    Calcium  7.5 (*)    Albumin 3.1 (*)    Total Bilirubin 6.3 (*)    All other components within normal limits  RETICULOCYTES - Abnormal; Notable for the following components:   Retic Ct Pct 25.0 (*)    RBC. 1.83 (*)    Retic Count, Absolute 456.9 (*)     Immature Retic Fract 30.1 (*)    All other components within normal limits  CBC WITH DIFFERENTIAL/PLATELET - Abnormal; Notable for the following components:   WBC 13.2 (*)    RBC 2.06 (*)    Hemoglobin 8.1 (*)    HCT 23.1 (*)    MCV 112.1 (*)    MCH 39.3 (*)    RDW 26.5 (*)    nRBC 139.6 (*)    Lymphs Abs 4.4 (*)    Abs Immature Granulocytes  0.14 (*)    All other components within normal limits    EKG: None  Radiology: No results found.   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  Pain feels consistent for sickle cell crisis.  No fever, chest pain or shortness of breath.  No nausea or vomiting.  His heart lungs are clear.  Abdomen soft, tender.  He has a nonfocal neuroexam is neurovascularly intact.  He is on his home 3 L via nasal cannula at baseline.  Will plan check labs, pain management and reassess  Labs personally viewed and interpreted:  CBC leukocytosis 13.2, hemoglobin 8.1, similar to prior Metabolic panel potassium 3.4, T. bili 6.3, similar to prior Elevated retics   Patient reassessed.  Has received 3 doses of pain medicine here.  We discussed inpatient versus outpatient management.  He feels like he is comfortable to go home.  He remained on his home nasal cannula.  Will have him follow-up with the sickle cell clinic, return for any  worsening symptoms.  I suspect his symptoms likely sickle cell pain crisis low suspicion for infectious process, ischemia, VTE, acute chest syndrome, PE, osteomyelitis, fracture, dislocation.  The patient has been appropriately medically screened and/or stabilized in the ED. I have low suspicion for any other emergent medical condition which would require further screening, evaluation or treatment in the ED or require inpatient management.  Patient is hemodynamically stable and in no acute distress.  Patient able to ambulate in department prior to ED.  Evaluation does not show acute pathology that would require ongoing or additional emergent interventions while in the emergency department or further inpatient treatment.  I have discussed the diagnosis with the patient and answered all questions.  Pain is been managed while in the emergency department and patient has no further complaints prior to discharge.  Patient is comfortable with plan discussed in room and is stable for discharge at this time.  I have discussed strict return precautions for returning to the emergency department.  Patient was encouraged to follow-up with PCP/specialist refer to at discharge.                                      Medical Decision Making Amount and/or Complexity of Data Reviewed External Data Reviewed: labs, radiology and notes. Labs: ordered. Decision-making details documented in ED Course.  Risk OTC drugs. Prescription drug management. Decision regarding hospitalization. Diagnosis or treatment significantly limited by social determinants of health.       Final diagnoses:  Sickle cell pain crisis [facility])    ED Discharge Orders     None          Mekesha Solomon A, PA-C 11/22/23 9392    Griselda Norris, MD 11/23/23 [phone]

## 2023-11-22 NOTE — Discharge Instructions (Signed)
 It was a pleasure taking care of you here today  Make sure to follow-up with sickle cell pain  Return for any worsening symptoms

## 2023-11-22 NOTE — ED Triage Notes (Addendum)
 Complaining of sickle cell pain all over arms, legs and back mostly. Started earlier last night.

## 2023-11-23 ENCOUNTER — Other Ambulatory Visit (HOSPITAL_COMMUNITY): Payer: Self-pay

## 2023-12-01 ENCOUNTER — Inpatient Hospital Stay: Payer: Self-pay | Admitting: Nurse Practitioner

## 2023-12-02 ENCOUNTER — Other Ambulatory Visit: Payer: Self-pay | Admitting: Nurse Practitioner

## 2023-12-02 DIAGNOSIS — F1190 Opioid use, unspecified, uncomplicated: Secondary | ICD-10-CM

## 2023-12-02 DIAGNOSIS — D571 Sickle-cell disease without crisis: Secondary | ICD-10-CM

## 2023-12-02 NOTE — Telephone Encounter (Signed)
 Copied from CRM #[id number]. Topic: Clinical - Medication Refill >> Dec 02, 2023 10:44 AM Selinda RAMAN wrote: Medication: oxyCODONE -acetaminophen  (PERCOCET) 10-325 MG tablet  Has the patient contacted their pharmacy? No   This is the patient's preferred pharmacy:   [id number] - [place], [place] - [address] AT [facility] [address] [place] [place] [place] [address] Phone: [phone] Fax: [phone] Hours: Not open 24 hours    Is this the correct pharmacy for this prescription? Yes If no, delete pharmacy and type the correct one.   Has the prescription been filled recently? No  Is the patient out of the medication? No but he only has enough to get him through tomorrow  Has the patient been seen for an appointment in the last year OR does the patient have an upcoming appointment? Yes  Can we respond through MyChart? Yes  Please assist patient further as he will be out tomorrow. He uses the maximum amount allotted each day and says it is a 2 week supply for him. I explained he needs to call several days in advance before he runs out and that it is a 48-72 business hour turn around time. He said he understands but truly needs this medication.

## 2023-12-03 MED ORDER — OXYCODONE-ACETAMINOPHEN 10-325 MG PO TABS: 1.0000 | ORAL_TABLET | ORAL | 0 refills | Status: DC | PRN

## 2023-12-03 NOTE — Telephone Encounter (Signed)
 Reviewed PDMP substance reporting system prior to prescribing opiate medications. No inconsistencies noted.    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  Dispense: 90 tablet; Refill: 0  2. Chronic, continuous use of opioids  - oxyCODONE -acetaminophen  (PERCOCET) 10-325 MG tablet; Take 1 tablet by mouth every 4 (four) hours as needed for pain.  Dispense: 90 tablet; Refill: 0

## 2023-12-11 ENCOUNTER — Emergency Department (HOSPITAL_COMMUNITY): Payer: Self-pay

## 2023-12-11 ENCOUNTER — Other Ambulatory Visit: Payer: Self-pay

## 2023-12-11 ENCOUNTER — Emergency Department (HOSPITAL_COMMUNITY)
Admission: EM | Admit: 2023-12-11 | Discharge: 2023-12-11 | Disposition: A | Discharge status: Home / Self Care | Payer: Self-pay | Attending: Emergency Medicine | Admitting: Emergency Medicine

## 2023-12-11 ENCOUNTER — Encounter (HOSPITAL_COMMUNITY): Payer: Self-pay

## 2023-12-11 DIAGNOSIS — Z7901 Long term (current) use of anticoagulants: Secondary | ICD-10-CM | POA: Insufficient documentation

## 2023-12-11 DIAGNOSIS — Z21 Asymptomatic human immunodeficiency virus [HIV] infection status: Secondary | ICD-10-CM | POA: Insufficient documentation

## 2023-12-11 DIAGNOSIS — D5700 Hb-SS disease with crisis, unspecified: Secondary | ICD-10-CM

## 2023-12-11 DIAGNOSIS — Z8616 Personal history of COVID-19: Secondary | ICD-10-CM | POA: Insufficient documentation

## 2023-12-11 DIAGNOSIS — D57219 Sickle-cell/Hb-C disease with crisis, unspecified: Secondary | ICD-10-CM | POA: Insufficient documentation

## 2023-12-11 LAB — RETICULOCYTES
Immature Retic Fract: 11.8 % (ref 2.3–15.9)
RBC.: 1.89 MIL/uL — ABNORMAL LOW (ref 4.22–5.81)
Retic Count, Absolute: 289.5 K/uL — ABNORMAL HIGH (ref 19.0–186.0)
Retic Ct Pct: 15.3 % — ABNORMAL HIGH (ref 0.4–3.1)

## 2023-12-11 LAB — COMPREHENSIVE METABOLIC PANEL WITH GFR
ALT: 17 U/L (ref 0–44)
AST: 36 U/L (ref 15–41)
Albumin: 3.9 g/dL (ref 3.5–5.0)
Alkaline Phosphatase: 47 U/L (ref 38–126)
Anion gap: 7 (ref 5–15)
BUN: 27 mg/dL — ABNORMAL HIGH (ref 6–20)
CO2: 24 mmol/L (ref 22–32)
Calcium: 8.7 mg/dL — ABNORMAL LOW (ref 8.9–10.3)
Chloride: 109 mmol/L (ref 98–111)
Creatinine, Ser: 0.98 mg/dL (ref 0.61–1.24)
GFR, Estimated: >60 mL/min (ref >60)
Glucose, Bld: 99 mg/dL (ref 70–99)
Potassium: 4.1 mmol/L (ref 3.5–5.1)
Sodium: 140 mmol/L (ref 135–145)
Total Bilirubin: 6.3 mg/dL — ABNORMAL HIGH (ref 0.0–1.2)
Total Protein: 7.7 g/dL (ref 6.5–8.1)

## 2023-12-11 LAB — CBC WITH DIFFERENTIAL/PLATELET
Basophils Absolute: 0 K/uL (ref 0.0–0.1)
Basophils Relative: 0 %
Eosinophils Absolute: 0.1 K/uL (ref 0.0–0.5)
Eosinophils Relative: 1 %
HCT: 21 % — ABNORMAL LOW (ref 39.0–52.0)
Hemoglobin: 7.6 g/dL — ABNORMAL LOW (ref 13.0–17.0)
Lymphocytes Relative: 31 %
Lymphs Abs: 2.8 K/uL (ref 0.7–4.0)
MCH: 38.6 pg — ABNORMAL HIGH (ref 26.0–34.0)
MCHC: 36.2 g/dL — ABNORMAL HIGH (ref 30.0–36.0)
MCV: 106.6 fL — ABNORMAL HIGH (ref 80.0–100.0)
Monocytes Absolute: 0.9 K/uL (ref 0.1–1.0)
Monocytes Relative: 10 %
Neutro Abs: 5.3 K/uL (ref 1.7–7.7)
Neutrophils Relative %: 58 %
Platelets: 292 K/uL (ref 150–400)
RBC: 1.97 MIL/uL — ABNORMAL LOW (ref 4.22–5.81)
RDW: 27.2 % — ABNORMAL HIGH (ref 11.5–15.5)
WBC: 9.1 K/uL (ref 4.0–10.5)
nRBC: 12.9 % — ABNORMAL HIGH (ref 0.0–0.2)

## 2023-12-11 LAB — PROTIME-INR
INR: 1.3 — ABNORMAL HIGH (ref 0.8–1.2)
Prothrombin Time: 16.6 s — ABNORMAL HIGH (ref 11.4–15.2)

## 2023-12-11 MED ORDER — HEPARIN SOD (PORK) LOCK FLUSH 100 UNIT/ML IV SOLN
INTRAVENOUS | Status: DC
Start: 2023-12-11 — End: 2023-12-11
  Filled 2023-12-11: qty 5

## 2023-12-11 MED ORDER — OXYCODONE HCL 5 MG PO TABS
10.0000 mg | ORAL_TABLET | Freq: Once | ORAL | Status: AC
  Administered 2023-12-11: 10 mg via ORAL
  Filled 2023-12-11: qty 2

## 2023-12-11 MED ORDER — HYDROMORPHONE HCL 1 MG/ML IJ SOLN
1.0000 mg | Freq: Once | INTRAMUSCULAR | Status: AC
  Administered 2023-12-11: 1 mg via INTRAVENOUS
  Filled 2023-12-11: qty 1

## 2023-12-11 MED ORDER — ONDANSETRON HCL 4 MG/2ML IJ SOLN
4.0000 mg | INTRAMUSCULAR | Status: DC | PRN
  Filled 2023-12-11: qty 2

## 2023-12-11 MED ORDER — SODIUM CHLORIDE 0.45 % IV SOLN: INTRAVENOUS | Status: DC

## 2023-12-11 MED ORDER — DIPHENHYDRAMINE HCL 25 MG PO CAPS
25.0000 mg | ORAL_CAPSULE | ORAL | Status: DC | PRN
  Filled 2023-12-11: qty 1

## 2023-12-11 NOTE — ED Triage Notes (Signed)
 Pt arrives with c/o sickle pain in his bilateral legs and shoulders and back that started today. Pt reports from pain meds are not providing relief. Pt was hypoxic at 83% on RA in triage. Pt put on 3L of [place] in triage and oxygen  sat increase 96%.

## 2023-12-11 NOTE — ED Provider Notes (Signed)
 [place] EMERGENCY DEPARTMENT AT [facility] Provider Note  CSN: [id number] Arrival date & time: 12/11/23 0115  Chief Complaint(s) Sickle Cell Pain Crisis  HPI Martin [name] is a 33 y.o. male with a past medical history listed below here for pain related to sickle cell.  He is endorsing bilateral leg, lower back and shoulder pain.  He denies any chest pain or shortness of breath.  He reports that he is chronically on 3 L nasal cannula.  Noted to be hypoxic on room air at 83% but improved to greater than 95 on his 3 L.  Patient reports that he is taking his home medication with minimal relief.  No nausea or vomiting.  No fevers or chills.  No cough or congestion.  No other physical complaints.  The history is provided by the patient.    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   Influenza A with pneumonia 06/20/2023   Acute hypoxemic respiratory failure (HCC) 06/20/2023   AKI (acute kidney injury) (HCC) 06/20/2023   Influenza A with respiratory manifestations 06/20/2023   Acute on chronic anemia 03/16/2023   History of HIV infection (HCC) 03/16/2023   Hypokalemia 03/16/2023   Anemia of chronic disease 07/13/2022   Hyperbilirubinemia 07/13/2022   Tinea capitis 07/29/2021   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HCC) 06/24/2021   Bacteremia due to Enterococcus 08/29/2020   Acute chest syndrome (HCC) 08/26/2020   Hypoxia 07/09/2020   COVID-19 05/13/2020   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/18/2020   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   Abnormal penile discharge, without blood 02/22/2020    Leukocytosis 01/02/2020   Anxiety 11/27/2019   Proteinuria 11/27/2019   Acute on chronic respiratory failure with hypoxia (HCC) 11/16/2019   Chronic, continuous use of opioids 08/15/2019   Seasonal allergies 08/15/2019   Chest congestion    Sickle cell anemia with crisis (HCC) 08/04/2019   Chronic pain syndrome 08/04/2019   History of pulmonary embolism 08/04/2019   Single subsegmental pulmonary embolism without acute cor pulmonale (HCC) 02/10/2019   Acute chest syndrome due to sickle cell crisis (HCC) 02/10/2019   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) 03/09/2018   Bone pain 03/07/2018   Hip pain 03/07/2018   Acute bronchitis due to Streptococcus 02/21/2018   Heart murmur 02/03/2017   Sickle cell disease with crisis (HCC) 02/02/2017   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 Authorizing Provider  abacavir -dolutegravir -lamiVUDine  (TRIUMEQ ) 600-50-300 MG tablet Take 1 tablet by mouth daily. 08/13/23   Pearlean Manus, MD  albuterol  (VENTOLIN  HFA) 108 (90 Base) MCG/ACT inhaler Inhale 2 puffs into the lungs every 6 (six) hours as needed for wheezing or shortness of breath. Patient  not taking: Reported on 10/20/2023 08/13/23   Pearlean Manus, MD  apixaban  (ELIQUIS ) 5 MG TABS tablet Take 1 tablet (5 mg total) by mouth 2 (two) times daily. 08/13/23   Pearlean Manus, MD  cyclobenzaprine  (FLEXERIL ) 10 MG tablet Take 10 mg by mouth 2 (two) times daily as needed for muscle spasms. 12/22/22   [provider]  Deferasirox  (JADENU ) 360 MG  TABS Take 2 tablets (720 mg total) by mouth daily. 11/05/23   [name], [name], [name]  diphenhydrAMINE  (BENADRYL ) 25 MG tablet Take 1 tablet (25 mg total) by mouth every 4 (four) hours as needed for itching. 08/13/23   Pearlean Manus, MD  folic acid  (FOLVITE ) 400 MCG tablet Take 1 tablet (400 mcg total) by mouth daily. 08/13/23   Pearlean Manus, MD  gabapentin  (NEURONTIN ) 300 MG capsule Take 1 capsule (300 mg total) by mouth at bedtime as needed (nerve pain). 08/13/23   Pearlean Manus, MD  guaiFENesin  (MUCINEX ) 600 MG 12 hr tablet Take 1 tablet (600 mg total) by mouth 2 (two) times daily. Patient not taking: Reported on 10/20/2023 08/13/23 08/12/24  Pearlean Manus, MD  hydroxyurea  (HYDREA ) 500 MG capsule Take 4 capsules (2,000 mg total) by mouth at bedtime. 08/13/23   Pearlean Manus, MD  NARCAN  4 MG/0.1ML LIQD nasal spray kit Place 1 spray into the nose as needed (respiratory distress / opioid overdose). 01/07/23   [provider]  oxyCODONE -acetaminophen  (PERCOCET) 10-325 MG tablet Take 1 tablet by mouth every 4 (four) hours as needed for pain. 12/03/23   [name], [name], [name]  pantoprazole  (PROTONIX ) 40 MG tablet Take 1 tablet (40 mg total) by mouth daily as needed (acid reflux). Patient not taking: Reported on 10/20/2023 08/13/23   Pearlean Manus, MD  senna-docusate (SENOKOT-S) 8.6-50 MG tablet Take 2 tablets by mouth at bedtime. Patient not taking: Reported on 10/20/2023 08/13/23   Pearlean Manus, MD  triamcinolone  cream (KENALOG ) 0.1 % Apply 1 Application topically 2 (two) times daily. Patient not taking: Reported on 10/27/2023 01/12/23   Tilford Bertram HERO, [name]  Vitamin D , Ergocalciferol , (DRISDOL ) 1.25 MG (50000 UNIT) CAPS capsule Take 1 capsule (50,000 Units total) by mouth every 7 (seven) days. 11/05/23   [name], [name], [name]  Vitamin D , Ergocalciferol , (DRISDOL ) 1.25 MG (50000 UNIT) CAPS capsule Take 1 capsule (50,000 Units total) by mouth every 7 (seven) days. 11/09/23   [name],  [name], [name]                                                                                                                                    Allergies Ketorolac  tromethamine , Tape, and Wound dressing adhesive  Review of Systems Review of Systems As noted in HPI  Physical Exam Vital Signs  I have reviewed the triage vital signs BP 97/61   Pulse 85   Temp 98.3 F (36.8 C) (Oral)   Resp 16   Wt 63.5 kg  SpO2 99%   BMI 17.50 kg/m   Physical Exam Vitals reviewed.  Constitutional:      General: He is not in acute distress.    Appearance: He is well-developed. He is not diaphoretic.  HENT:     Head: Normocephalic and atraumatic.     Nose: Nose normal.  Eyes:     General: No scleral icterus.       Right eye: No discharge.        Left eye: No discharge.     Conjunctiva/sclera: Conjunctivae normal.     Pupils: Pupils are equal, round, and reactive to light.  Cardiovascular:     Rate and Rhythm: Normal rate and regular rhythm.     Heart sounds: No murmur heard.    No friction rub. No gallop.  Pulmonary:     Effort: Pulmonary effort is normal. No respiratory distress.     Breath sounds: Normal breath sounds. No stridor. No rales.  Abdominal:     General: There is no distension.     Palpations: Abdomen is soft.     Tenderness: There is no abdominal tenderness.  Musculoskeletal:        General: No tenderness.     Cervical back: Normal range of motion and neck supple.  Skin:    General: Skin is warm and dry.     Findings: No erythema or rash.  Neurological:     Mental Status: He is alert and oriented to person, place, and time.     ED Results and Treatments Labs (all labs ordered are listed, but only abnormal results are displayed) Labs Reviewed  CBC WITH DIFFERENTIAL/PLATELET - Abnormal; Notable for the following components:      Result Value   RBC 1.97 (*)    Hemoglobin 7.6 (*)    HCT 21.0 (*)    MCV 106.6 (*)    MCH 38.6 (*)    MCHC 36.2 (*)    RDW  27.2 (*)    nRBC 12.9 (*)    All other components within normal limits  PROTIME-INR - Abnormal; Notable for the following components:   Prothrombin Time 16.6 (*)    INR 1.3 (*)    All other components within normal limits  RETICULOCYTES - Abnormal; Notable for the following components:   Retic Ct Pct 15.3 (*)    RBC. 1.89 (*)    Retic Count, Absolute 289.5 (*)    All other components within normal limits  COMPREHENSIVE METABOLIC PANEL WITH GFR - Abnormal; Notable for the following components:   BUN 27 (*)    Calcium  8.7 (*)    Total Bilirubin 6.3 (*)    All other components within normal limits                                                                                                                         EKG  EKG Interpretation Date/Time:    Ventricular Rate:    PR Interval:  QRS Duration:    QT Interval:    QTC Calculation:   R Axis:      Text Interpretation:         Radiology DG Chest Port 1 View Result Date: 12/11/2023 CLINICAL DATA:  Sickle cell crisis with chest pain, initial encounter EXAM: PORTABLE CHEST 1 VIEW COMPARISON:  11/07/2023 FINDINGS: Cardiac shadow is stable. Right chest wall port is noted in satisfactory position. Mild interstitial changes are seen bilaterally similar to that noted on the prior exam. No focal infiltrate or effusion is noted. No acute bony abnormality is seen. IMPRESSION: Stable interstitial changes bilaterally. No acute abnormality noted. Electronically Signed   By: Oneil Devonshire M.D.   On: 12/11/2023 02:44    Medications Ordered in ED Medications  0.45 % sodium chloride  infusion (0 mLs Intravenous Stopped 12/11/23 0655)  ondansetron  (ZOFRAN ) injection 4 mg (has no administration in time range)  diphenhydrAMINE  (BENADRYL ) capsule 25-50 mg (has no administration in time range)  heparin  lock flush 100 UNIT/ML injection (has no administration in time range)  HYDROmorphone  (DILAUDID ) injection 1 mg (1 mg Intravenous Given 12/11/23  0302)  HYDROmorphone  (DILAUDID ) injection 1 mg (1 mg Intravenous Given 12/11/23 0458)  oxyCODONE  (Oxy IR/ROXICODONE ) immediate release tablet 10 mg (10 mg Oral Given 12/11/23 0457)   Procedures Procedures  (including critical care time) Medical Decision Making / ED Course   Medical Decision Making Amount and/or Complexity of Data Reviewed Labs: ordered. Decision-making details documented in ED Course. Radiology: ordered and independent interpretation performed. Decision-making details documented in ED Course.  Risk Prescription drug management.    Pain related to sickle cell. Will get labs to assess for evidence of acute hemolytic or aplastic crisis. Provided with IV fluids and a single dose of IV pain medicine.  Labs are reassuring and close to his baseline with a hemoglobin of 7.6.  Bilirubin stable and not consistent with hemolysis.  No electrolyte derangements or renal sufficiency.  Chest x-ray without evidence of pneumonia, pneumothorax, pulmonary edema or pleural effusion.  Patient given second IV dose and oral dose of pain medicine.  Stable for discharge.    Final Clinical Impression(s) / ED Diagnoses Final diagnoses:  Sickle cell anemia with pain [facility])   The patient appears reasonably screened and/or stabilized for discharge and I doubt any other medical condition or other [facility] requiring further screening, evaluation, or treatment in the ED at this time. I have discussed the findings, Dx and Tx plan with the patient/family who expressed understanding and agree(s) with the plan. Discharge instructions discussed at length. The patient/family was given strict return precautions who verbalized understanding of the instructions. No further questions at time of discharge.  Disposition: Discharge  Condition: Good  ED Discharge Orders     None        Follow Up: [name], [name], [name] [address] [place], [place] [phone]  Call  to  schedule an appointment for close follow up    This chart was dictated using voice recognition software.  Despite best efforts to proofread,  errors can occur which can change the documentation meaning.    Trine Raynell Moder, MD 12/11/23 [phone]

## 2023-12-11 NOTE — Discharge Instructions (Signed)
 Please reach out to your home health company to inquire about portable oxygen  tank or compressor.

## 2023-12-15 ENCOUNTER — Emergency Department (HOSPITAL_COMMUNITY): Payer: Self-pay

## 2023-12-15 ENCOUNTER — Encounter (HOSPITAL_COMMUNITY): Payer: Self-pay | Admitting: Emergency Medicine

## 2023-12-15 ENCOUNTER — Emergency Department (HOSPITAL_COMMUNITY)
Admission: EM | Admit: 2023-12-15 | Discharge: 2023-12-15 | Disposition: A | Discharge status: Home / Self Care | Payer: Self-pay | Attending: Emergency Medicine | Admitting: Emergency Medicine

## 2023-12-15 ENCOUNTER — Telehealth (HOSPITAL_COMMUNITY): Payer: Self-pay | Admitting: *Deleted

## 2023-12-15 ENCOUNTER — Other Ambulatory Visit: Payer: Self-pay

## 2023-12-15 DIAGNOSIS — Z7901 Long term (current) use of anticoagulants: Secondary | ICD-10-CM | POA: Insufficient documentation

## 2023-12-15 DIAGNOSIS — D5700 Hb-SS disease with crisis, unspecified: Secondary | ICD-10-CM | POA: Insufficient documentation

## 2023-12-15 LAB — RETICULOCYTES
Immature Retic Fract: 24.2 % — ABNORMAL HIGH (ref 2.3–15.9)
RBC.: 2 MIL/uL — ABNORMAL LOW (ref 4.22–5.81)
Retic Count, Absolute: 355.2 K/uL — ABNORMAL HIGH (ref 19.0–186.0)
Retic Ct Pct: 17.8 % — ABNORMAL HIGH (ref 0.4–3.1)

## 2023-12-15 LAB — CBC WITH DIFFERENTIAL/PLATELET
Abs Granulocyte: 7.2 K/uL — ABNORMAL HIGH (ref 1.5–6.5)
Abs Immature Granulocytes: 0.05 K/uL (ref 0.00–0.07)
Basophils Absolute: 0.1 K/uL (ref 0.0–0.1)
Basophils Relative: 0 %
Eosinophils Absolute: 0.1 K/uL (ref 0.0–0.5)
Eosinophils Relative: 1 %
HCT: 21.9 % — ABNORMAL LOW (ref 39.0–52.0)
Hemoglobin: 7.8 g/dL — ABNORMAL LOW (ref 13.0–17.0)
Immature Granulocytes: 0 %
Lymphocytes Relative: 37 %
Lymphs Abs: 5.2 K/uL — ABNORMAL HIGH (ref 0.7–4.0)
MCH: 38.6 pg — ABNORMAL HIGH (ref 26.0–34.0)
MCHC: 35.6 g/dL (ref 30.0–36.0)
MCV: 108.4 fL — ABNORMAL HIGH (ref 80.0–100.0)
Monocytes Absolute: 1.4 K/uL — ABNORMAL HIGH (ref 0.1–1.0)
Monocytes Relative: 10 %
Neutro Abs: 7.2 K/uL (ref 1.7–7.7)
Neutrophils Relative %: 52 %
Platelets: 404 K/uL — ABNORMAL HIGH (ref 150–400)
RBC: 2.02 MIL/uL — ABNORMAL LOW (ref 4.22–5.81)
RDW: 27.9 % — ABNORMAL HIGH (ref 11.5–15.5)
Smear Review: NORMAL
WBC: 14 K/uL — ABNORMAL HIGH (ref 4.0–10.5)
nRBC: 15.2 % — ABNORMAL HIGH (ref 0.0–0.2)

## 2023-12-15 LAB — COMPREHENSIVE METABOLIC PANEL WITH GFR
ALT: 15 U/L (ref 0–44)
AST: 29 U/L (ref 15–41)
Albumin: 4.1 g/dL (ref 3.5–5.0)
Alkaline Phosphatase: 62 U/L (ref 38–126)
Anion gap: 12 (ref 5–15)
BUN: 11 mg/dL (ref 6–20)
CO2: 22 mmol/L (ref 22–32)
Calcium: 8.8 mg/dL — ABNORMAL LOW (ref 8.9–10.3)
Chloride: 109 mmol/L (ref 98–111)
Creatinine, Ser: 0.84 mg/dL (ref 0.61–1.24)
GFR, Estimated: >60 mL/min (ref >60)
Glucose, Bld: 94 mg/dL (ref 70–99)
Potassium: 3.7 mmol/L (ref 3.5–5.1)
Sodium: 142 mmol/L (ref 135–145)
Total Bilirubin: 5.9 mg/dL — ABNORMAL HIGH (ref 0.0–1.2)
Total Protein: 7.5 g/dL (ref 6.5–8.1)

## 2023-12-15 MED ORDER — SODIUM CHLORIDE 0.45 % IV SOLN: INTRAVENOUS | Status: DC

## 2023-12-15 MED ORDER — HYDROMORPHONE HCL 1 MG/ML IJ SOLN
2.0000 mg | INTRAMUSCULAR | Status: AC
  Administered 2023-12-15: 2 mg via INTRAVENOUS
  Filled 2023-12-15: qty 2

## 2023-12-15 MED ORDER — DIPHENHYDRAMINE HCL 25 MG PO CAPS: 25.0000 mg | ORAL_CAPSULE | ORAL | Status: DC | PRN

## 2023-12-15 MED ORDER — ONDANSETRON 8 MG PO TBDP
8.0000 mg | ORAL_TABLET | Freq: Three times a day (TID) | ORAL | Status: DC | PRN
Start: 2023-12-15 — End: 2023-12-15

## 2023-12-15 MED ORDER — MORPHINE SULFATE 15 MG PO TABS
30.0000 mg | ORAL_TABLET | Freq: Once | ORAL | Status: AC
  Administered 2023-12-15: 30 mg via ORAL
  Filled 2023-12-15: qty 2

## 2023-12-15 MED ORDER — KETOROLAC TROMETHAMINE 15 MG/ML IJ SOLN
15.0000 mg | INTRAMUSCULAR | Status: DC
  Filled 2023-12-15: qty 1

## 2023-12-15 NOTE — ED Provider Notes (Signed)
 [facility] EMERGENCY DEPARTMENT AT [facility] Provider Note   CSN: [id number] Arrival date & time: 12/15/23  9945     Patient presents with: Sickle Cell Pain Crisis   Martin [name] is a 33 y.o. male.  Patient with past medical history significant for sickle cell disease, anxiety, HIV presents to the emergency department complaining of generalized body pain in arms, legs, abdomen.  He specifically denies chest pain or shortness of breath.  The patient uses 4 L/min of oxygen  at home and upon arrival was not using his oxygen .  Nursing staff noted a room air saturation of 85%.  He was placed on his 4 L as prescribed and sats immediately improved to 95+ percent.  Patient states he has been taking his home medication without relief of pain.  He says that pain has been ongoing for approximately 2 days.    Sickle Cell Pain Crisis      Prior to Admission medications   Medication Sig Start Date End Date Taking? Authorizing Provider  abacavir -dolutegravir -lamiVUDine  (TRIUMEQ ) 600-50-300 MG tablet Take 1 tablet by mouth daily. 08/13/23   Pearlean Manus, MD  albuterol  (VENTOLIN  HFA) 108 (90 Base) MCG/ACT inhaler Inhale 2 puffs into the lungs every 6 (six) hours as needed for wheezing or shortness of breath. Patient not taking: Reported on 10/20/2023 08/13/23   Pearlean Manus, MD  apixaban  (ELIQUIS ) 5 MG TABS tablet Take 1 tablet (5 mg total) by mouth 2 (two) times daily. 08/13/23   Pearlean Manus, MD  cyclobenzaprine  (FLEXERIL ) 10 MG tablet Take 10 mg by mouth 2 (two) times daily as needed for muscle spasms. 12/22/22   [provider]  Deferasirox  (JADENU ) 360 MG TABS Take 2 tablets (720 mg total) by mouth daily. 11/05/23   [name], [name], [name]  diphenhydrAMINE  (BENADRYL ) 25 MG tablet Take 1 tablet (25 mg total) by mouth every 4 (four) hours as needed for itching. 08/13/23   Pearlean Manus, MD  folic acid  (FOLVITE ) 400 MCG tablet Take 1 tablet (400 mcg total) by mouth  daily. 08/13/23   Pearlean Manus, MD  gabapentin  (NEURONTIN ) 300 MG capsule Take 1 capsule (300 mg total) by mouth at bedtime as needed (nerve pain). 08/13/23   Pearlean Manus, MD  guaiFENesin  (MUCINEX ) 600 MG 12 hr tablet Take 1 tablet (600 mg total) by mouth 2 (two) times daily. Patient not taking: Reported on 10/20/2023 08/13/23 08/12/24  Pearlean Manus, MD  hydroxyurea  (HYDREA ) 500 MG capsule Take 4 capsules (2,000 mg total) by mouth at bedtime. 08/13/23   Pearlean Manus, MD  NARCAN  4 MG/0.1ML LIQD nasal spray kit Place 1 spray into the nose as needed (respiratory distress / opioid overdose). 01/07/23   [provider]  oxyCODONE -acetaminophen  (PERCOCET) 10-325 MG tablet Take 1 tablet by mouth every 4 (four) hours as needed for pain. 12/03/23   [name], [name], [name]  pantoprazole  (PROTONIX ) 40 MG tablet Take 1 tablet (40 mg total) by mouth daily as needed (acid reflux). Patient not taking: Reported on 10/20/2023 08/13/23   Pearlean Manus, MD  senna-docusate (SENOKOT-S) 8.6-50 MG tablet Take 2 tablets by mouth at bedtime. Patient not taking: Reported on 10/20/2023 08/13/23   Pearlean Manus, MD  triamcinolone  cream (KENALOG ) 0.1 % Apply 1 Application topically 2 (two) times daily. Patient not taking: Reported on 10/27/2023 01/12/23   Tilford Bertram HERO, [name]  Vitamin D , Ergocalciferol , (DRISDOL ) 1.25 MG (50000 UNIT) CAPS capsule Take 1 capsule (50,000 Units total) by mouth every 7 (seven) days. 11/05/23  [name], [name], [name]  Vitamin D , Ergocalciferol , (DRISDOL ) 1.25 MG (50000 UNIT) CAPS capsule Take 1 capsule (50,000 Units total) by mouth every 7 (seven) days. 11/09/23   [name], [name], [name]    Allergies: Ketorolac  tromethamine , Tape, and Wound dressing adhesive    Review of Systems  Updated Vital Signs BP 105/84   Pulse 82   Temp 98.5 F (36.9 C)   Resp 14   Ht 6' 3 (1.905 m)   Wt 63.5 kg   SpO2 93%   BMI 17.50 kg/m   Physical Exam Vitals reviewed.  Constitutional:       General: He is not in acute distress.    Appearance: He is well-developed. He is not diaphoretic.  HENT:     Head: Normocephalic and atraumatic.     Nose: Nose normal.  Eyes:     General: No scleral icterus.       Right eye: No discharge.        Left eye: No discharge.     Conjunctiva/sclera: Conjunctivae normal.     Pupils: Pupils are equal, round, and reactive to light.  Cardiovascular:     Rate and Rhythm: Normal rate and regular rhythm.     Heart sounds: No murmur heard.    No friction rub. No gallop.  Pulmonary:     Effort: Pulmonary effort is normal. No respiratory distress.     Breath sounds: Normal breath sounds. No stridor. No rales.  Abdominal:     General: There is no distension.     Palpations: Abdomen is soft.     Tenderness: There is no abdominal tenderness.  Musculoskeletal:        General: No tenderness.     Cervical back: Normal range of motion and neck supple.  Skin:    General: Skin is warm and dry.     Findings: No erythema or rash.  Neurological:     Mental Status: He is alert and oriented to person, place, and time.     (all labs ordered are listed, but only abnormal results are displayed) Labs Reviewed  CBC WITH DIFFERENTIAL/PLATELET - Abnormal; Notable for the following components:      Result Value   WBC 14.0 (*)    RBC 2.02 (*)    Hemoglobin 7.8 (*)    HCT 21.9 (*)    MCV 108.4 (*)    MCH 38.6 (*)    RDW 27.9 (*)    Platelets 404 (*)    nRBC 15.2 (*)    Lymphs Abs 5.2 (*)    Monocytes Absolute 1.4 (*)    Abs Granulocyte 7.2 (*)    All other components within normal limits  RETICULOCYTES - Abnormal; Notable for the following components:   Retic Ct Pct 17.8 (*)    RBC. 2.00 (*)    Retic Count, Absolute 355.2 (*)    Immature Retic Fract 24.2 (*)    All other components within normal limits  COMPREHENSIVE METABOLIC PANEL WITH GFR - Abnormal; Notable for the following components:   Calcium  8.8 (*)    Total Bilirubin 5.9 (*)    All  other components within normal limits    EKG: EKG Interpretation Date/Time:  Wednesday December 15 2023 01:08:40 EDT Ventricular Rate:  87 PR Interval:  155 QRS Duration:  89 QT Interval:  399 QTC Calculation: 480 R Axis:   106  Text Interpretation: Sinus rhythm Probable inferior infarct, old Abnormal lateral Q waves Abnormal T, consider ischemia, anterior leads  No significant change since last tracing Confirmed by Griselda Norris [phone]) on 12/15/2023 1:15:14 AM  Radiology: DG Chest 2 View Result Date: 12/15/2023 CLINICAL DATA:  Sickle cell crisis, dyspnea. EXAM: CHEST - 2 VIEW COMPARISON:  December 11, 2023 FINDINGS: There is stable right-sided venous Port-A-Cath positioning. The heart size and mediastinal contours are within normal limits. Mild, diffuse, chronic appearing increased interstitial lung markings are seen. There is no evidence of focal consolidation, pleural effusion or pneumothorax. No acute osseous abnormality is identified. IMPRESSION: Predominantly stable chronic appearing increased interstitial lung markings. Electronically Signed   By: Suzen Dials M.D.   On: 12/15/2023 01:56     .Critical Care  Performed by: Logan Ubaldo NOVAK, PA-C Authorized by: Logan Ubaldo NOVAK, PA-C   Critical care provider statement:    Critical care time (minutes):  30   Critical care time was exclusive of:  Separately billable procedures and treating other patients   Critical care was necessary to treat or prevent imminent or life-threatening deterioration of the following conditions: Sickle cell disease.   Critical care was time spent personally by me on the following activities:  Development of treatment plan with patient or surrogate, discussions with consultants, evaluation of patient's response to treatment, examination of patient, ordering and review of laboratory studies, ordering and review of radiographic studies, ordering and performing treatments and interventions, pulse oximetry,  re-evaluation of patient's condition and review of old charts    Medications Ordered in the ED  0.45 % sodium chloride  infusion ( Intravenous New Bag/Given 12/15/23 0221)  diphenhydrAMINE  (BENADRYL ) capsule 25-50 mg (has no administration in time range)  ondansetron  (ZOFRAN -ODT) disintegrating tablet 8 mg (has no administration in time range)  morphine  (MSIR) tablet 30 mg (has no administration in time range)  HYDROmorphone  (DILAUDID ) injection 2 mg (2 mg Intravenous Given 12/15/23 0214)  HYDROmorphone  (DILAUDID ) injection 2 mg (2 mg Intravenous Given 12/15/23 0245)  HYDROmorphone  (DILAUDID ) injection 2 mg (2 mg Intravenous Given 12/15/23 0326)                                    Medical Decision Making Amount and/or Complexity of Data Reviewed Labs: ordered. Radiology: ordered.  Risk Prescription drug management.   This patient presents to the ED for concern of generalized bodyaches, this involves an extensive number of treatment options, and is a complaint that carries with it a high risk of complications and morbidity.  The differential diagnosis includes sickle cell disease with pain, sickle cell pain crisis, chronic pain, others   Co morbidities / Chronic conditions that complicate the patient evaluation  Sickle cell disease   Additional history obtained:  Additional history obtained from EMR External records from outside source obtained and reviewed including recent day hospital notes   Lab Tests:  I Ordered, and personally interpreted labs.  The pertinent results include: CBC, CMP, reticulocytes grossly at baseline   Imaging Studies ordered:  I ordered imaging studies including chest x-ray I independently visualized and interpreted imaging which showed  Predominantly stable chronic appearing increased interstitial lung  markings.   I agree with the radiologist interpretation   Cardiac Monitoring: / EKG:  The patient was maintained on a cardiac monitor.  I  personally viewed and interpreted the cardiac monitored which showed an underlying rhythm of: Sinus rhythm   Problem List / ED Course / Critical interventions / Medication management   I ordered medication including Dilaudid , half-normal saline  Reevaluation of the patient after these medicines showed that the patient improved  Social Determinants of Health:  Patient is self-pay   Test / Admission - Considered:  Patient with labs grossly at baseline.  He denies chest pain or shortness of breath.  Pain seems consistent with his sickle cell disease.  He was treated with multiple doses of Dilaudid  and states he is feeling much better at this time.  He would like to discharge home which seems reasonable.  He has been provided with return precautions including chest pain and shortness of breath.  Patient to follow-up with his primary care team for further management as needed      Final diagnoses:  Sickle cell pain crisis [facility])    ED Discharge Orders     None          Logan Ubaldo KATHEE DEVONNA 12/15/23 0530    Griselda Norris, MD 12/15/23 [phone]

## 2023-12-15 NOTE — Telephone Encounter (Signed)
 Patient called requesting to come to the day hospital for sickle cell pain. Patient reports generalized pain rated 10/10. Reports taking Percocet at 5:30 am. Patient was treated in the ED for sickle cell pain this morning.  Denies fever, chest pain, nausea, vomiting, diarrhea, abdominal pain and priapism. Admits to having transportation without driving self. [name], NP notified. The day hospital is at capacity and unable to accept additional patients. Patient advised and expresses an understanding.

## 2023-12-15 NOTE — Discharge Instructions (Addendum)
 Please follow-up with your primary team for further management of your sickle cell disease.  Return to the emergency department or dial 911 if you develop any life-threatening symptoms such as chest pain or shortness of breath

## 2023-12-15 NOTE — ED Triage Notes (Signed)
 Pt presents to the ED via POV with complaints of SSC pain that started tonight - last took a percocet around 2230 with no improvement. Pts RA sat was 80-85%, placed on 4L  and sats improved to 95%. A&Ox4 at this time. Denies CP.

## 2023-12-16 ENCOUNTER — Non-Acute Institutional Stay (HOSPITAL_COMMUNITY)
Admission: AD | Admit: 2023-12-16 | Discharge: 2023-12-16 | Disposition: A | Discharge status: Home / Self Care | Payer: Self-pay | Source: Ambulatory Visit | Attending: Internal Medicine | Admitting: Internal Medicine

## 2023-12-16 ENCOUNTER — Telehealth (HOSPITAL_COMMUNITY): Payer: Self-pay | Admitting: General Practice

## 2023-12-16 ENCOUNTER — Other Ambulatory Visit: Payer: Self-pay

## 2023-12-16 DIAGNOSIS — Z9981 Dependence on supplemental oxygen: Secondary | ICD-10-CM | POA: Insufficient documentation

## 2023-12-16 DIAGNOSIS — Z7901 Long term (current) use of anticoagulants: Secondary | ICD-10-CM | POA: Insufficient documentation

## 2023-12-16 DIAGNOSIS — D571 Sickle-cell disease without crisis: Secondary | ICD-10-CM

## 2023-12-16 DIAGNOSIS — F1190 Opioid use, unspecified, uncomplicated: Secondary | ICD-10-CM

## 2023-12-16 DIAGNOSIS — Z21 Asymptomatic human immunodeficiency virus [HIV] infection status: Secondary | ICD-10-CM | POA: Insufficient documentation

## 2023-12-16 DIAGNOSIS — D5700 Hb-SS disease with crisis, unspecified: Secondary | ICD-10-CM | POA: Insufficient documentation

## 2023-12-16 DIAGNOSIS — Z86711 Personal history of pulmonary embolism: Secondary | ICD-10-CM | POA: Insufficient documentation

## 2023-12-16 DIAGNOSIS — J9611 Chronic respiratory failure with hypoxia: Secondary | ICD-10-CM | POA: Insufficient documentation

## 2023-12-16 DIAGNOSIS — F411 Generalized anxiety disorder: Secondary | ICD-10-CM | POA: Insufficient documentation

## 2023-12-16 MED ORDER — NALOXONE HCL 0.4 MG/ML IJ SOLN: 0.4000 mg | INTRAMUSCULAR | Status: DC | PRN

## 2023-12-16 MED ORDER — ONDANSETRON HCL 4 MG/2ML IJ SOLN: 4.0000 mg | Freq: Four times a day (QID) | INTRAMUSCULAR | Status: DC | PRN

## 2023-12-16 MED ORDER — HYDROMORPHONE 1 MG/ML IV SOLN
INTRAVENOUS | Status: DC
  Administered 2023-12-16: 30 mg via INTRAVENOUS
  Administered 2023-12-16: 15 mg via INTRAVENOUS
  Filled 2023-12-16: qty 30

## 2023-12-16 MED ORDER — SODIUM CHLORIDE 0.9% FLUSH
10.0000 mL | INTRAVENOUS | Status: AC | PRN
  Administered 2023-12-16: 10 mL

## 2023-12-16 MED ORDER — POLYETHYLENE GLYCOL 3350 17 G PO PACK: 17.0000 g | PACK | Freq: Every day | ORAL | Status: DC | PRN

## 2023-12-16 MED ORDER — SODIUM CHLORIDE 0.9% FLUSH: 9.0000 mL | INTRAVENOUS | Status: DC | PRN

## 2023-12-16 MED ORDER — SENNOSIDES-DOCUSATE SODIUM 8.6-50 MG PO TABS: 1.0000 | ORAL_TABLET | Freq: Two times a day (BID) | ORAL | Status: DC

## 2023-12-16 MED ORDER — SODIUM CHLORIDE 0.45 % IV SOLN: INTRAVENOUS | Status: DC

## 2023-12-16 MED ORDER — HEPARIN SOD (PORK) LOCK FLUSH 100 UNIT/ML IV SOLN
500.0000 [IU] | INTRAVENOUS | Status: AC | PRN
  Administered 2023-12-16: 500 [IU]
  Filled 2023-12-16: qty 5

## 2023-12-16 MED ORDER — DIPHENHYDRAMINE HCL 25 MG PO CAPS: 25.0000 mg | ORAL_CAPSULE | ORAL | Status: DC | PRN

## 2023-12-16 MED ORDER — ACETAMINOPHEN 500 MG PO TABS: 1000.0000 mg | ORAL_TABLET | Freq: Once | ORAL | Status: DC

## 2023-12-16 NOTE — Telephone Encounter (Signed)
 Patient called, requesting to come to the day hospital due to generalized pain rated at 10/10. Denied chest pain, fever, diarrhea, abdominal pain, nausea/vomiting and priapism. Screened negative for Covid-19 symptoms. Admitted to having means of transportation without driving self after treatment. Last took 1 tablet of percocet 10/325 mg at 05:00 am today. Per provider, patient can come to the day hospital for treatment. Patient notified, verbalized understanding.

## 2023-12-16 NOTE — Discharge Summary (Addendum)
 Physician Discharge Summary  Martin [name] FMW:[id number] DOB: 1990-11-23 DOA: 12/16/2023  PCP: [name], [name], [name]  Admit date: 12/16/2023  Discharge date: 12/16/2023  Time spent: 30 minutes  Discharge Diagnoses:  Active Problems:   Sickle cell anemia with pain [facility])   Discharge Condition: Stable  Diet recommendation: Regular  History of present illness:  Martin [name] was admitted to the day hospital with sickle cell painful crisis. Patient was treated with IV fluid, weight based IV Dilaudid  PCA, IV Toradol , clinician assisted doses as deemed appropriate, and other adjunct therapies per sickle cell pain management protocol. Martin [name] showed significant improvement symptomatically, pain improved from /10 to /10 at the time of discharge. Patient was discharged home in a hemodynamically stable condition. Martin [name] will follow-up at the clinic as previously scheduled, continue with home medications as per prior to admission.   Hospital Course:  Martin [name] was admitted to the day hospital with sickle cell painful crisis. Patient was treated with IV fluid, weight based IV Dilaudid  PCA, IV Toradol , clinician assisted doses as deemed appropriate, and other adjunct therapies per sickle cell pain management protocol. Martin [name] showed significant improvement symptomatically, pain improved from 10/10 to 6/10 at the time of discharge. Patient was discharged home in a hemodynamically stable condition. Martin [name] will follow-up at the clinic as previously scheduled, continue with home medications as per prior to admission.  Discharge Instructions We discussed the need for good hydration, monitoring of hydration status, avoidance of heat, cold, stress, and infection triggers. We discussed the need to be compliant with taking Hydrea  and other home medications. Martin [name] was reminded of the need to seek medical attention immediately if any symptom of bleeding, anemia, or infection  occurs.  Discharge Exam: Vitals:   12/16/23 1539 12/16/23 1612  BP: 126/79   Pulse: (!) 103   Resp: 14 14  Temp:    SpO2: 93% 93%    General appearance: alert, cooperative and no distress Eyes: conjunctivae/corneas clear. PERRL, EOM's intact. Fundi benign. Neck: no adenopathy, no carotid bruit, no JVD, supple, symmetrical, trachea midline and thyroid not enlarged, symmetric, no tenderness/mass/nodules Back: symmetric, no curvature. ROM normal. No CVA tenderness. Resp: clear to auscultation bilaterally Chest wall: no tenderness Cardio: regular rate and rhythm, S1, S2 normal, no murmur, click, rub or gallop GI: soft, non-tender; bowel sounds normal; no masses,  no organomegaly Extremities: extremities normal, atraumatic, no cyanosis or edema Pulses: 2+ and symmetric Skin: Skin color, texture, turgor normal. No rashes or lesions Neurologic: Grossly normal  Discharge Instructions     Call MD for:  severe uncontrolled pain   Complete by: As directed    Call MD for:  temperature >100.4   Complete by: As directed    Diet - low sodium heart healthy   Complete by: As directed    Increase activity slowly   Complete by: As directed       Allergies as of 12/16/2023       Reactions   Ketorolac  Tromethamine  Swelling, Other (See Comments)   Patient reports facial edema and left arm edema after administration.   Tape Rash, Other (See Comments)   PLEASE DO NOT USE THE CLEAR, THICK, PLASTIC TAPE- only paper tape is tolerated    Wound Dressing Adhesive Rash        Medication List     TAKE these medications    albuterol  108 (90 Base) MCG/ACT inhaler Commonly known as: VENTOLIN  HFA Inhale 2 puffs into the lungs every 6 (six) hours as  needed for wheezing or shortness of breath.   cyclobenzaprine  10 MG tablet Commonly known as: FLEXERIL  Take 10 mg by mouth 2 (two) times daily as needed for muscle spasms.   Deferasirox  360 MG Tabs Commonly known as: Jadenu  Take 2 tablets  (720 mg total) by mouth daily.   diphenhydrAMINE  25 MG tablet Commonly known as: BENADRYL  Take 1 tablet (25 mg total) by mouth every 4 (four) hours as needed for itching.   Eliquis  5 MG Tabs tablet Generic drug: apixaban  Take 1 tablet (5 mg total) by mouth 2 (two) times daily.   folic acid  400 MCG tablet Commonly known as: FOLVITE  Take 1 tablet (400 mcg total) by mouth daily.   gabapentin  300 MG capsule Commonly known as: NEURONTIN  Take 1 capsule (300 mg total) by mouth at bedtime as needed (nerve pain).   guaiFENesin  600 MG 12 hr tablet Commonly known as: Mucinex  Take 1 tablet (600 mg total) by mouth 2 (two) times daily.   hydroxyurea  500 MG capsule Commonly known as: HYDREA  Take 4 capsules (2,000 mg total) by mouth at bedtime.   Narcan  4 MG/0.1ML Liqd nasal spray kit Generic drug: naloxone  Place 1 spray into the nose as needed (respiratory distress / opioid overdose).   oxyCODONE -acetaminophen  10-325 MG tablet Commonly known as: PERCOCET Take 1 tablet by mouth every 4 (four) hours as needed for pain.   pantoprazole  40 MG tablet Commonly known as: PROTONIX  Take 1 tablet (40 mg total) by mouth daily as needed (acid reflux).   senna-docusate 8.6-50 MG tablet Commonly known as: Senokot-S Take 2 tablets by mouth at bedtime.   triamcinolone  cream 0.1 % Commonly known as: KENALOG  Apply 1 Application topically 2 (two) times daily.   Triumeq  600-50-300 MG tablet Generic drug: abacavir -dolutegravir -lamiVUDine  Take 1 tablet by mouth daily.   Vitamin D  (Ergocalciferol ) 1.25 MG (50000 UNIT) Caps capsule Commonly known as: DRISDOL  Take 1 capsule (50,000 Units total) by mouth every 7 (seven) days.   Vitamin D  (Ergocalciferol ) 1.25 MG (50000 UNIT) Caps capsule Commonly known as: DRISDOL  Take 1 capsule (50,000 Units total) by mouth every 7 (seven) days.       Allergies  Allergen Reactions   Ketorolac  Tromethamine  Swelling and Other (See Comments)    Patient reports  facial edema and left arm edema after administration.   Tape Rash and Other (See Comments)    PLEASE DO NOT USE THE CLEAR, THICK, PLASTIC TAPE- only paper tape is tolerated    Wound Dressing Adhesive Rash     Significant Diagnostic Studies: DG Chest 2 View Result Date: 12/15/2023 CLINICAL DATA:  Sickle cell crisis, dyspnea. EXAM: CHEST - 2 VIEW COMPARISON:  December 11, 2023 FINDINGS: There is stable right-sided venous Port-A-Cath positioning. The heart size and mediastinal contours are within normal limits. Mild, diffuse, chronic appearing increased interstitial lung markings are seen. There is no evidence of focal consolidation, pleural effusion or pneumothorax. No acute osseous abnormality is identified. IMPRESSION: Predominantly stable chronic appearing increased interstitial lung markings. Electronically Signed   By: Suzen Dials M.D.   On: 12/15/2023 01:56   DG Chest Port 1 View Result Date: 12/11/2023 CLINICAL DATA:  Sickle cell crisis with chest pain, initial encounter EXAM: PORTABLE CHEST 1 VIEW COMPARISON:  11/07/2023 FINDINGS: Cardiac shadow is stable. Right chest wall port is noted in satisfactory position. Mild interstitial changes are seen bilaterally similar to that noted on the prior exam. No focal infiltrate or effusion is noted. No acute bony abnormality is seen. IMPRESSION: Stable interstitial changes  bilaterally. No acute abnormality noted. Electronically Signed   By: Oneil Devonshire M.D.   On: 12/11/2023 02:44    Signed:  [name] Cover NP   12/16/2023, 5:13 PM

## 2023-12-16 NOTE — Progress Notes (Signed)
 Patient admitted to the day infusion hospital for sickle cell pain. Initially, patient reported generalized pain rated 10/10. For pain management, patient placed on Sickle Cell Dose Dilaudid  PCA and hydrated with IV fluids. At discharge, patient rated pain at 6/10. Vital signs stable. AVS offered but patient refused. Patient alert, oriented and ambulatory at discharge.

## 2023-12-16 NOTE — H&P (Signed)
 [facility] History and Physical  [name] FMW:[id number] DOB: 04-11-1991 DOA: 12/16/2023  PCP: [name], [name], [name]   Chief Complaint:  Chief Complaint  Patient presents with   Sickle Cell Pain Crisis    HPI: Martin [name] is a 33 y.o. 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  Last took 1 tablet of percocet 10/325 mg at 05:00 am today with no resolution to pain symptoms.   Systemic Review: General: The patient denies anorexia, fever, weight loss Cardiac: Denies chest pain, syncope, palpitations, pedal edema  Respiratory: Denies cough, shortness of breath, wheezing GI: Denies severe indigestion/heartburn, abdominal pain, nausea, vomiting, diarrhea and constipation GU: Denies hematuria, incontinence, dysuria  Musculoskeletal: Denies arthritis  Skin: Denies suspicious skin lesions Neurologic: Denies focal weakness or numbness, change in vision  Past Medical History:  Diagnosis Date   Anxiety    HIV (human immunodeficiency virus infection) (HCC)    Proteinuria    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HCC)    Vitamin D  deficiency 10/2018    Past Surgical History:  Procedure Laterality Date   IR IMAGING GUIDED PORT INSERTION  08/29/2019   IR REMOVAL TUN ACCESS W/ PORT W/O FL MOD SED  08/30/2020   TEE WITHOUT CARDIOVERSION N/A 08/30/2020   Procedure: TRANSESOPHAGEAL ECHOCARDIOGRAM (TEE);  Surgeon: Mona Vinie BROCKS, MD;  Location: [facility] ENDOSCOPY;  Service: Cardiovascular;  Laterality: N/A;    Allergies  Allergen Reactions   Ketorolac  Tromethamine  Swelling and Other (See Comments)    Patient  reports facial edema and left arm edema after administration.   Tape Rash and Other (See Comments)    PLEASE DO NOT USE THE CLEAR, THICK, PLASTIC TAPE- only paper tape is tolerated    Wound Dressing Adhesive Rash    Family History  Problem Relation Age of Onset   Sickle cell trait Mother    Sickle cell trait Father    Birth defects Maternal Grandmother    Birth defects Paternal Grandmother       Prior to Admission medications   Medication Sig Start Date End Date Taking? Authorizing Provider  abacavir -dolutegravir -lamiVUDine  (TRIUMEQ ) 600-50-300 MG tablet Take 1 tablet by mouth daily. 08/13/23  Yes Emokpae, Courage, MD  apixaban  (ELIQUIS ) 5 MG TABS tablet Take 1 tablet (5 mg total) by mouth 2 (two) times daily. 08/13/23  Yes Pearlean Manus, MD  cyclobenzaprine  (FLEXERIL ) 10 MG tablet Take 10 mg by mouth 2 (two) times daily as needed for muscle spasms. 12/22/22  Yes [provider]  diphenhydrAMINE  (BENADRYL ) 25 MG tablet Take 1 tablet (25 mg total) by mouth every 4 (four) hours as needed for itching. 08/13/23  Yes Emokpae, Courage, MD  folic acid  (FOLVITE ) 400 MCG tablet Take 1 tablet (400 mcg total) by mouth daily. 08/13/23  Yes Pearlean Manus, MD  gabapentin  (NEURONTIN ) 300 MG capsule Take 1 capsule (300 mg total) by mouth at bedtime as needed (nerve pain). 08/13/23  Yes Emokpae, Courage, MD  hydroxyurea  (HYDREA ) 500 MG capsule Take 4 capsules (2,000 mg total) by mouth at bedtime. 08/13/23  Yes Emokpae, Courage, MD  oxyCODONE -acetaminophen  (PERCOCET) 10-325 MG tablet Take 1 tablet by mouth every 4 (four) hours as needed  for pain. 12/03/23  Yes [name], [name], [name]  Vitamin D , Ergocalciferol , (DRISDOL ) 1.25 MG (50000 UNIT) CAPS capsule Take 1 capsule (50,000 Units total) by mouth every 7 (seven) days. 11/05/23  Yes [name], [name], [name]  albuterol  (VENTOLIN  HFA) 108 (90 Base) MCG/ACT inhaler Inhale 2 puffs into the lungs every 6 (six) hours as needed for wheezing or shortness of  breath. Patient not taking: Reported on 10/20/2023 08/13/23   Pearlean Manus, MD  Deferasirox  (JADENU ) 360 MG TABS Take 2 tablets (720 mg total) by mouth daily. 11/05/23   [name], [name], [name]  guaiFENesin  (MUCINEX ) 600 MG 12 hr tablet Take 1 tablet (600 mg total) by mouth 2 (two) times daily. Patient not taking: Reported on 10/20/2023 08/13/23 08/12/24  Pearlean Manus, MD  NARCAN  4 MG/0.1ML LIQD nasal spray kit Place 1 spray into the nose as needed (respiratory distress / opioid overdose). 01/07/23   [provider]  pantoprazole  (PROTONIX ) 40 MG tablet Take 1 tablet (40 mg total) by mouth daily as needed (acid reflux). Patient not taking: Reported on 10/20/2023 08/13/23   Pearlean Manus, MD  senna-docusate (SENOKOT-S) 8.6-50 MG tablet Take 2 tablets by mouth at bedtime. Patient not taking: Reported on 10/20/2023 08/13/23   Pearlean Manus, MD  triamcinolone  cream (KENALOG ) 0.1 % Apply 1 Application topically 2 (two) times daily. Patient not taking: Reported on 10/27/2023 01/12/23   Tilford Bertram HERO, [name]  Vitamin D , Ergocalciferol , (DRISDOL ) 1.25 MG (50000 UNIT) CAPS capsule Take 1 capsule (50,000 Units total) by mouth every 7 (seven) days. 11/09/23   [name], [name], [name]     Physical Exam: Vitals:   12/16/23 1146 12/16/23 1436 12/16/23 1539 12/16/23 1612  BP: 110/66 102/61 126/79   Pulse: 93 86 (!) 103   Resp: 14 12 14 14   Temp:      TempSrc:      SpO2: (!) 87% (!) 87% 93% 93%    General: Alert, awake, afebrile, anicteric, not in obvious distress HEENT: Normocephalic and Atraumatic, Mucous membranes pink                PERRLA; EOM intact; No scleral icterus,                 Nares: Patent, Oropharynx: Clear, Fair Dentition                 Neck: FROM, no cervical lymphadenopathy, thyromegaly, carotid bruit or JVD;  CHEST WALL: No tenderness  CHEST: Normal respiration, clear to auscultation bilaterally  HEART: Regular rate and rhythm; no murmurs rubs or gallops  BACK: No  kyphosis or scoliosis; no CVA tenderness  ABDOMEN: Positive Bowel Sounds, soft, non-tender; no masses, no organomegaly EXTREMITIES: No cyanosis, clubbing, or edema SKIN:  no rash or ulceration  CNS: Alert and Oriented x 4, Nonfocal exam, CN 2-12 intact  Labs on Admission:  Basic Metabolic Panel: Recent Labs  Lab 12/11/23 0303 12/15/23 0203  NA 140 142  K 4.1 3.7  CL 109 109  CO2 24 22  GLUCOSE 99 94  BUN 27* 11  CREATININE 0.98 0.84  CALCIUM  8.7* 8.8*   Liver Function Tests: Recent Labs  Lab 12/11/23 0303 12/15/23 0203  AST 36 29  ALT 17 15  ALKPHOS 47 62  BILITOT 6.3* 5.9*  PROT 7.7 7.5  ALBUMIN 3.9 4.1   No results for input(s): LIPASE, AMYLASE in the last 168 hours. No results for input(s): AMMONIA in the last 168 hours. CBC: Recent Labs  Lab 12/11/23  0303 12/15/23 0203  WBC 9.1 14.0*  NEUTROABS 5.3 7.2  HGB 7.6* 7.8*  HCT 21.0* 21.9*  MCV 106.6* 108.4*  PLT 292 404*   Cardiac Enzymes: No results for input(s): CKTOTAL, CKMB, CKMBINDEX, TROPONINI in the last 168 hours.  BNP (last 3 results) No results for input(s): BNP in the last 8760 hours.  ProBNP (last 3 results) No results for input(s): PROBNP in the last 8760 hours.  CBG: No results for input(s): GLUCAP in the last 168 hours.   Assessment/Plan Active Problems:   Sickle cell anemia with pain (HCC)  Admits to the Day Hospital for extended observation IVF 0 .45% Saline @ 125 mls/hour Weight based Dilaudid  PCA started within 30 minutes of admission Acetaminophen  1000 mg x 1 dose Labs: CBCD, CMP, Retic Count and LDH Monitor vitals very closely, Re-evaluate pain scale every hour 2 L of Oxygen  by [place] Patient will be re-evaluated for pain in the context of function and relationship to baseline as care progresses. If no significant relieve from pain (remains above 5/10) will transfer patient to inpatient services for further evaluation and management  Code Status:  Full  Family Communication: None  DVT Prophylaxis: Ambulate as tolerated   Time spent: 35 Minutes  [name] Cover NP   If 7PM-7AM, please contact night-coverage [email or web address] 12/16/2023, 5:12 PM

## 2023-12-17 ENCOUNTER — Other Ambulatory Visit: Payer: Self-pay

## 2023-12-17 ENCOUNTER — Inpatient Hospital Stay (HOSPITAL_COMMUNITY)
Admission: EM | Admit: 2023-12-17 | Discharge: 2023-12-24 | DRG: 812 | Disposition: A | Discharge status: Home / Self Care | Payer: Self-pay | Attending: Internal Medicine | Admitting: Internal Medicine

## 2023-12-17 ENCOUNTER — Emergency Department (HOSPITAL_COMMUNITY): Payer: Self-pay

## 2023-12-17 ENCOUNTER — Encounter (HOSPITAL_COMMUNITY): Payer: Self-pay

## 2023-12-17 ENCOUNTER — Other Ambulatory Visit (HOSPITAL_COMMUNITY): Payer: Self-pay

## 2023-12-17 DIAGNOSIS — Z86711 Personal history of pulmonary embolism: Secondary | ICD-10-CM

## 2023-12-17 DIAGNOSIS — F419 Anxiety disorder, unspecified: Secondary | ICD-10-CM | POA: Diagnosis present

## 2023-12-17 DIAGNOSIS — Z21 Asymptomatic human immunodeficiency virus [HIV] infection status: Secondary | ICD-10-CM | POA: Diagnosis present

## 2023-12-17 DIAGNOSIS — Z832 Family history of diseases of the blood and blood-forming organs and certain disorders involving the immune mechanism: Secondary | ICD-10-CM

## 2023-12-17 DIAGNOSIS — F411 Generalized anxiety disorder: Secondary | ICD-10-CM | POA: Diagnosis present

## 2023-12-17 DIAGNOSIS — Z886 Allergy status to analgesic agent status: Secondary | ICD-10-CM

## 2023-12-17 DIAGNOSIS — J9611 Chronic respiratory failure with hypoxia: Secondary | ICD-10-CM | POA: Diagnosis present

## 2023-12-17 DIAGNOSIS — D638 Anemia in other chronic diseases classified elsewhere: Secondary | ICD-10-CM | POA: Diagnosis present

## 2023-12-17 DIAGNOSIS — Z9981 Dependence on supplemental oxygen: Secondary | ICD-10-CM

## 2023-12-17 DIAGNOSIS — Z7901 Long term (current) use of anticoagulants: Secondary | ICD-10-CM

## 2023-12-17 DIAGNOSIS — Z79899 Other long term (current) drug therapy: Secondary | ICD-10-CM

## 2023-12-17 DIAGNOSIS — Z91048 Other nonmedicinal substance allergy status: Secondary | ICD-10-CM

## 2023-12-17 DIAGNOSIS — G894 Chronic pain syndrome: Secondary | ICD-10-CM | POA: Diagnosis present

## 2023-12-17 DIAGNOSIS — K219 Gastro-esophageal reflux disease without esophagitis: Secondary | ICD-10-CM | POA: Diagnosis present

## 2023-12-17 DIAGNOSIS — D72829 Elevated white blood cell count, unspecified: Secondary | ICD-10-CM | POA: Diagnosis present

## 2023-12-17 DIAGNOSIS — D5700 Hb-SS disease with crisis, unspecified: Principal | ICD-10-CM | POA: Diagnosis present

## 2023-12-17 LAB — COMPREHENSIVE METABOLIC PANEL WITH GFR
ALT: 13 U/L (ref 0–44)
AST: 25 U/L (ref 15–41)
Albumin: 4 g/dL (ref 3.5–5.0)
Alkaline Phosphatase: 61 U/L (ref 38–126)
Anion gap: 11 (ref 5–15)
BUN: 7 mg/dL (ref 6–20)
CO2: 23 mmol/L (ref 22–32)
Calcium: 8.4 mg/dL — ABNORMAL LOW (ref 8.9–10.3)
Chloride: 107 mmol/L (ref 98–111)
Creatinine, Ser: 0.79 mg/dL (ref 0.61–1.24)
GFR, Estimated: >60 mL/min (ref >60)
Glucose, Bld: 99 mg/dL (ref 70–99)
Potassium: 3.8 mmol/L (ref 3.5–5.1)
Sodium: 141 mmol/L (ref 135–145)
Total Bilirubin: 6.4 mg/dL — ABNORMAL HIGH (ref 0.0–1.2)
Total Protein: 7.3 g/dL (ref 6.5–8.1)

## 2023-12-17 LAB — CBC WITH DIFFERENTIAL/PLATELET
Abs Immature Granulocytes: 0.06 K/uL (ref 0.00–0.07)
Basophils Absolute: 0.1 K/uL (ref 0.0–0.1)
Basophils Relative: 0 %
Eosinophils Absolute: 0.1 K/uL (ref 0.0–0.5)
Eosinophils Relative: 1 %
HCT: 23.2 % — ABNORMAL LOW (ref 39.0–52.0)
Hemoglobin: 8.2 g/dL — ABNORMAL LOW (ref 13.0–17.0)
Immature Granulocytes: 0 %
Lymphocytes Relative: 27 %
Lymphs Abs: 3.8 K/uL (ref 0.7–4.0)
MCH: 37.8 pg — ABNORMAL HIGH (ref 26.0–34.0)
MCHC: 35.3 g/dL (ref 30.0–36.0)
MCV: 106.9 fL — ABNORMAL HIGH (ref 80.0–100.0)
Monocytes Absolute: 1.3 K/uL — ABNORMAL HIGH (ref 0.1–1.0)
Monocytes Relative: 9 %
Neutro Abs: 8.9 K/uL — ABNORMAL HIGH (ref 1.7–7.7)
Neutrophils Relative %: 63 %
Platelets: 369 K/uL (ref 150–400)
RBC: 2.17 MIL/uL — ABNORMAL LOW (ref 4.22–5.81)
RDW: 27 % — ABNORMAL HIGH (ref 11.5–15.5)
Smear Review: NORMAL
WBC: 14.2 K/uL — ABNORMAL HIGH (ref 4.0–10.5)
nRBC: 21.6 % — ABNORMAL HIGH (ref 0.0–0.2)

## 2023-12-17 LAB — RETICULOCYTES
Immature Retic Fract: 31.4 % — ABNORMAL HIGH (ref 2.3–15.9)
RBC.: 2.24 MIL/uL — ABNORMAL LOW (ref 4.22–5.81)
Retic Count, Absolute: 552 K/uL — ABNORMAL HIGH (ref 19.0–186.0)
Retic Ct Pct: 24.6 % — ABNORMAL HIGH (ref 0.4–3.1)

## 2023-12-17 MED ORDER — SODIUM CHLORIDE 0.9% FLUSH: 9.0000 mL | INTRAVENOUS | Status: DC | PRN

## 2023-12-17 MED ORDER — HYDROMORPHONE HCL 1 MG/ML IJ SOLN
2.0000 mg | INTRAMUSCULAR | Status: AC
  Administered 2023-12-17: 2 mg via INTRAVENOUS
  Filled 2023-12-17: qty 2

## 2023-12-17 MED ORDER — DEXTROSE-SODIUM CHLORIDE 5-0.45 % IV SOLN: INTRAVENOUS | Status: DC

## 2023-12-17 MED ORDER — HYDROMORPHONE 1 MG/ML IV SOLN
INTRAVENOUS | Status: DC
  Administered 2023-12-18 (×2): 30 mg via INTRAVENOUS
  Administered 2023-12-18: 7 mg via INTRAVENOUS
  Administered 2023-12-19: 30 mg via INTRAVENOUS
  Administered 2023-12-19 (×2): 0.5 mg via INTRAVENOUS
  Administered 2023-12-19: 8.5 mg via INTRAVENOUS
  Administered 2023-12-19: 9 mg via INTRAVENOUS
  Administered 2023-12-19: 5.5 mg via INTRAVENOUS
  Administered 2023-12-19: 6.5 mg via INTRAVENOUS
  Administered 2023-12-20: 9 mg via INTRAVENOUS
  Administered 2023-12-20: 4 mg via INTRAVENOUS
  Administered 2023-12-20: 6 mg via INTRAVENOUS
  Administered 2023-12-20: 30 mg via INTRAVENOUS
  Administered 2023-12-20: 15 mg via INTRAVENOUS
  Administered 2023-12-20: 8 mg via INTRAVENOUS
  Administered 2023-12-21: 1.5 mg via INTRAVENOUS
  Administered 2023-12-21: 3.5 mg via INTRAVENOUS
  Administered 2023-12-21: 4.5 mg via INTRAVENOUS
  Administered 2023-12-21: 3.5 mg via INTRAVENOUS
  Administered 2023-12-21: 6 mg via INTRAVENOUS
  Administered 2023-12-21: 8.5 mg via INTRAVENOUS
  Administered 2023-12-21: 30 mg via INTRAVENOUS
  Administered 2023-12-22: 5 mg via INTRAVENOUS
  Administered 2023-12-22: 2 mg via INTRAVENOUS
  Administered 2023-12-22: 3 mg via INTRAVENOUS
  Administered 2023-12-22: 1 mg via INTRAVENOUS
  Administered 2023-12-22: 2.5 mg via INTRAVENOUS
  Administered 2023-12-22: 7 mg via INTRAVENOUS
  Administered 2023-12-23: 3.5 mg via INTRAVENOUS
  Administered 2023-12-23: 7 mg via INTRAVENOUS
  Administered 2023-12-23: 4 mg via INTRAVENOUS
  Administered 2023-12-23: 5 mg via INTRAVENOUS
  Administered 2023-12-23 (×2): 5.5 mg via INTRAVENOUS
  Administered 2023-12-24: 30 mg via INTRAVENOUS
  Administered 2023-12-24: 1 mg via INTRAVENOUS
  Administered 2023-12-24: 3 mg via INTRAVENOUS
  Administered 2023-12-24: 1.5 mg via INTRAVENOUS
  Administered 2023-12-24: 4.5 mg via INTRAVENOUS
  Filled 2023-12-17 (×8): qty 30

## 2023-12-17 MED ORDER — FOLIC ACID 1 MG PO TABS
500.0000 ug | ORAL_TABLET | Freq: Every day | ORAL | Status: DC
  Administered 2023-12-17 – 2023-12-24 (×8): 0.5 mg via ORAL
  Filled 2023-12-17 (×8): qty 1

## 2023-12-17 MED ORDER — SODIUM CHLORIDE 0.9% FLUSH
3.0000 mL | Freq: Two times a day (BID) | INTRAVENOUS | Status: DC
  Administered 2023-12-17 – 2023-12-24 (×10): 3 mL via INTRAVENOUS

## 2023-12-17 MED ORDER — HYDROMORPHONE 1 MG/ML IV SOLN
INTRAVENOUS | Status: DC
  Administered 2023-12-17: 30 mg via INTRAVENOUS
  Filled 2023-12-17: qty 30

## 2023-12-17 MED ORDER — CYCLOBENZAPRINE HCL 10 MG PO TABS
10.0000 mg | ORAL_TABLET | Freq: Two times a day (BID) | ORAL | Status: DC | PRN
  Administered 2023-12-18 – 2023-12-21 (×2): 10 mg via ORAL
  Filled 2023-12-17 (×3): qty 1

## 2023-12-17 MED ORDER — SODIUM CHLORIDE 0.9% FLUSH
10.0000 mL | Freq: Two times a day (BID) | INTRAVENOUS | Status: DC
  Administered 2023-12-18 – 2023-12-24 (×10): 10 mL

## 2023-12-17 MED ORDER — ONDANSETRON HCL 4 MG/2ML IJ SOLN
4.0000 mg | Freq: Four times a day (QID) | INTRAMUSCULAR | Status: DC | PRN
  Administered 2023-12-21 – 2023-12-24 (×2): 4 mg via INTRAVENOUS
  Filled 2023-12-17 (×2): qty 2

## 2023-12-17 MED ORDER — SODIUM CHLORIDE 0.9% FLUSH
10.0000 mL | INTRAVENOUS | Status: DC | PRN
  Administered 2023-12-24: 10 mL

## 2023-12-17 MED ORDER — SODIUM CHLORIDE 0.9 % IV SOLN: 250.0000 mL | INTRAVENOUS | Status: DC | PRN

## 2023-12-17 MED ORDER — SODIUM CHLORIDE 0.9 % IV SOLN
25.0000 mg | Freq: Once | INTRAVENOUS | Status: AC
  Administered 2023-12-17: 25 mg via INTRAVENOUS
  Filled 2023-12-17: qty 25

## 2023-12-17 MED ORDER — BISACODYL 10 MG RE SUPP
10.0000 mg | Freq: Every day | RECTAL | Status: AC | PRN
  Administered 2023-12-17 – 2023-12-18 (×2): 10 mg via RECTAL
  Filled 2023-12-17 (×2): qty 1

## 2023-12-17 MED ORDER — NALOXONE HCL 0.4 MG/ML IJ SOLN: 0.4000 mg | INTRAMUSCULAR | Status: DC | PRN

## 2023-12-17 MED ORDER — CHLORHEXIDINE GLUCONATE CLOTH 2 % EX PADS
6.0000 | MEDICATED_PAD | Freq: Every day | CUTANEOUS | Status: DC
  Administered 2023-12-17 – 2023-12-24 (×3): 6 via TOPICAL

## 2023-12-17 MED ORDER — SENNOSIDES-DOCUSATE SODIUM 8.6-50 MG PO TABS
2.0000 | ORAL_TABLET | Freq: Every day | ORAL | Status: DC
  Administered 2023-12-18 – 2023-12-20 (×2): 2 via ORAL
  Filled 2023-12-17 (×5): qty 2

## 2023-12-17 MED ORDER — HYDROXYUREA 500 MG PO CAPS
2000.0000 mg | ORAL_CAPSULE | Freq: Every day | ORAL | Status: DC
  Administered 2023-12-17 – 2023-12-23 (×7): 2000 mg via ORAL
  Filled 2023-12-17 (×7): qty 4

## 2023-12-17 MED ORDER — ONDANSETRON HCL 4 MG PO TABS
4.0000 mg | ORAL_TABLET | Freq: Four times a day (QID) | ORAL | Status: DC | PRN
Start: 2023-12-17 — End: 2023-12-24
  Administered 2023-12-20 – 2023-12-21 (×2): 4 mg via ORAL
  Filled 2023-12-17 (×2): qty 1

## 2023-12-17 MED ORDER — SODIUM CHLORIDE 0.9% FLUSH: 3.0000 mL | INTRAVENOUS | Status: DC | PRN

## 2023-12-17 MED ORDER — NALOXONE HCL 4 MG/0.1ML NA LIQD: 1.0000 | NASAL | Status: DC | PRN

## 2023-12-17 MED ORDER — ACETAMINOPHEN 325 MG PO TABS: 650.0000 mg | ORAL_TABLET | Freq: Four times a day (QID) | ORAL | Status: DC | PRN

## 2023-12-17 MED ORDER — BENZOCAINE 10 % MT GEL
Freq: Three times a day (TID) | OROMUCOSAL | Status: DC | PRN
  Filled 2023-12-17: qty 9.4

## 2023-12-17 MED ORDER — ACETAMINOPHEN 10 MG/ML IV SOLN
1000.0000 mg | Freq: Once | INTRAVENOUS | Status: AC
  Administered 2023-12-17: 1000 mg via INTRAVENOUS
  Filled 2023-12-17: qty 100

## 2023-12-17 MED ORDER — OXYCODONE-ACETAMINOPHEN 10-325 MG PO TABS: 1.0000 | ORAL_TABLET | Freq: Three times a day (TID) | ORAL | Status: DC | PRN

## 2023-12-17 MED ORDER — ABACAVIR-DOLUTEGRAVIR-LAMIVUD 600-50-300 MG PO TABS
1.0000 | ORAL_TABLET | Freq: Every day | ORAL | Status: DC
  Administered 2023-12-17 – 2023-12-23 (×7): 1 via ORAL
  Filled 2023-12-17 (×9): qty 1

## 2023-12-17 MED ORDER — HYDROMORPHONE 1 MG/ML IV SOLN: INTRAVENOUS | Status: DC

## 2023-12-17 MED ORDER — APIXABAN 5 MG PO TABS
5.0000 mg | ORAL_TABLET | Freq: Two times a day (BID) | ORAL | Status: DC
  Administered 2023-12-17 – 2023-12-24 (×15): 5 mg via ORAL
  Filled 2023-12-17: qty 2
  Filled 2023-12-17 (×4): qty 1
  Filled 2023-12-17: qty 2
  Filled 2023-12-17: qty 1
  Filled 2023-12-17: qty 2
  Filled 2023-12-17 (×4): qty 1
  Filled 2023-12-17 (×2): qty 2
  Filled 2023-12-17: qty 1

## 2023-12-17 MED ORDER — PANTOPRAZOLE SODIUM 40 MG PO TBEC: 40.0000 mg | DELAYED_RELEASE_TABLET | Freq: Every day | ORAL | Status: DC | PRN

## 2023-12-17 MED ORDER — SODIUM CHLORIDE 0.9% FLUSH
3.0000 mL | Freq: Two times a day (BID) | INTRAVENOUS | Status: DC
Start: 2023-12-17 — End: 2023-12-17

## 2023-12-17 MED ORDER — DIPHENHYDRAMINE HCL 50 MG/ML IJ SOLN: 50.0000 mg | Freq: Four times a day (QID) | INTRAMUSCULAR | Status: DC | PRN

## 2023-12-17 MED ORDER — ALBUTEROL SULFATE (2.5 MG/3ML) 0.083% IN NEBU: 2.5000 mg | INHALATION_SOLUTION | Freq: Four times a day (QID) | RESPIRATORY_TRACT | Status: DC | PRN

## 2023-12-17 MED ORDER — OXYCODONE-ACETAMINOPHEN 10-325 MG PO TABS
1.0000 | ORAL_TABLET | ORAL | 0 refills | Status: DC | PRN
  Filled 2023-12-17: qty 90, 15d supply, fill #0

## 2023-12-17 MED ORDER — HYDROMORPHONE HCL 1 MG/ML IJ SOLN: 2.0000 mg | INTRAMUSCULAR | Status: DC | PRN

## 2023-12-17 MED ORDER — GABAPENTIN 300 MG PO CAPS: 300.0000 mg | ORAL_CAPSULE | Freq: Every evening | ORAL | Status: DC | PRN

## 2023-12-17 MED ORDER — ACETAMINOPHEN 650 MG RE SUPP: 650.0000 mg | Freq: Four times a day (QID) | RECTAL | Status: DC | PRN

## 2023-12-17 MED ORDER — SODIUM CHLORIDE 0.45 % IV SOLN: INTRAVENOUS | Status: AC

## 2023-12-17 NOTE — Progress Notes (Signed)
 Patient ID: Martin [name], male   DOB: May 18, 1990, 33 y.o.   MRN: [id number] Subjective: Martin [name] is a 33 y.o. male with history of sickle cell disease, history of acute chest syndrome, multiple sickle cell crisis, multiple ED visits and hospital admission, history of PE currently on Eliquis , chronic hypoxic respiratory failure on home oxygen , HIV infection and generalized anxiety disorder who presented to the emergency room with major complaints of generalized body pain that is consistent with his typical sickle cell pain crisis.   Patient is reporting pain of 8/10 today.  He has no new concerns.  Denies cough, headache, fever, nausea, vomiting, diarrhea.  No urinary symptoms.  Objective:  Vital signs in last 24 hours:  Vitals:   12/17/23 0330 12/17/23 0659 12/17/23 0723 12/17/23 1100  BP: 115/74 106/66  98/64  Pulse: 83 76  72  Resp: 16 14 16 16   Temp:  99.1 F (37.3 C)  98.6 F (37 C)  TempSrc:  Oral  Oral  SpO2: 90% 94% 94% 98%  Weight:        Intake/Output from previous day:   Intake/Output Summary (Last 24 hours) at 12/17/2023 1233 Last data filed at 12/17/2023 0257 Gross per 24 hour  Intake 50 ml  Output --  Net 50 ml    Physical Exam: General: Alert, awake, oriented x3, in no acute distress.  HEENT: [place]/AT PEERL, EOMI Neck: Trachea midline,  no masses, no thyromegal,y no JVD, no carotid bruit OROPHARYNX:  Moist, No exudate/ erythema/lesions.  Heart: Regular rate and rhythm, without murmurs, rubs, gallops, PMI non-displaced, no heaves or thrills on palpation.  Lungs: Clear to auscultation, no wheezing or rhonchi noted. No increased vocal fremitus resonant to percussion  Abdomen: Soft, nontender, nondistended, positive bowel sounds, no masses no hepatosplenomegaly noted..  Neuro: No focal neurological deficits noted cranial nerves II through XII grossly intact. DTRs 2+ bilaterally upper and lower extremities. Strength 5 out of 5 in bilateral upper and lower  extremities. Musculoskeletal: Generalized body tenderness.   Psychiatric: Patient alert and oriented x3, good insight and cognition, good recent to remote recall. Lymph node survey: No cervical axillary or inguinal lymphadenopathy noted.  Lab Results:  Basic Metabolic Panel:    Component Value Date/Time   NA 141 12/17/2023 0212   NA 140 01/12/2023 1607   K 3.8 12/17/2023 0212   CL 107 12/17/2023 0212   CO2 23 12/17/2023 0212   BUN 7 12/17/2023 0212   BUN 8 01/12/2023 1607   CREATININE 0.79 12/17/2023 0212   GLUCOSE 99 12/17/2023 0212   CALCIUM  8.4 (L) 12/17/2023 0212   CBC:    Component Value Date/Time   WBC 14.2 (H) 12/17/2023 0212   HGB 8.2 (L) 12/17/2023 0212   HGB 7.4 (L) 01/12/2023 1607   HCT 23.2 (L) 12/17/2023 0212   HCT 21.7 (L) 01/12/2023 1607   PLT 369 12/17/2023 0212   PLT 321 01/12/2023 1607   MCV 106.9 (H) 12/17/2023 0212   MCV 94 01/12/2023 1607   NEUTROABS 8.9 (H) 12/17/2023 0212   NEUTROABS 3.8 01/12/2023 1607   LYMPHSABS 3.8 12/17/2023 0212   LYMPHSABS 5.6 (H) 01/12/2023 1607   MONOABS 1.3 (H) 12/17/2023 0212   EOSABS 0.1 12/17/2023 0212   EOSABS 0.2 01/12/2023 1607   BASOSABS 0.1 12/17/2023 0212   BASOSABS 0.1 01/12/2023 1607    No results found for this or any previous visit (from the past 240 hours).  Studies/Results: No results found.  Medications: Scheduled Meds:  abacavir -dolutegravir -lamiVUDine   1 tablet Oral Daily   apixaban   5 mg Oral BID   Chlorhexidine  Gluconate Cloth  6 each Topical Daily   folic acid   500 mcg Oral Daily   HYDROmorphone    Intravenous Q4H   hydroxyurea   2,000 mg Oral QHS   senna-docusate  2 tablet Oral QHS   sodium chloride  flush  10-40 mL Intracatheter Q12H   sodium chloride  flush  3 mL Intravenous Q12H   Continuous Infusions:  sodium chloride  125 mL/hr at 12/17/23 0624   PRN Meds:.acetaminophen  **OR** acetaminophen , albuterol , cyclobenzaprine , gabapentin , naloxone  **AND** sodium chloride  flush, ondansetron   **OR** ondansetron  (ZOFRAN ) IV, pantoprazole , sodium chloride  flush  Consultants: None  Procedures: None  Antibiotics: None  Assessment/Plan: Principal Problem:   Sickle cell pain crisis (HCC) Active Problems:   Sickle cell disease with crisis (HCC)   Generalized anxiety disorder   GERD (gastroesophageal reflux disease)   HIV (human immunodeficiency virus infection) (HCC)   Chronic pain syndrome   History of pulmonary embolism   Anxiety   Chronic hypoxic respiratory failure (HCC)   Hb Sickle Cell Disease with Pain crisis: Continue IVF 0.45% Saline @KVO  continue weight based Dilaudid  PCA, IV Toradol  15 mg Q 6 H for a total of 5 days, continue oral home pain medications as ordered. Monitor vitals very closely, Re-evaluate pain scale regularly, 2 L of Oxygen  by [place]. Patient encouraged to ambulate on the hallway today.  Leukocytosis: Slightly elevated however patient is having no signs or symptoms of infection.  Will continue to monitor without antibiotics.  And monitor daily CBC. Anemia of Chronic Disease: Hemoglobin slightly lower than patient's baseline.  No transfusion at this time.  Will continue to monitor daily CBC. Chronic pain Syndrome: Continue oral home pain medication. History of HIV infection: Continue Abacavir -dolutegravir -lamiVUDine  (TRIUMEQ ) as prescribed. Chronic hypoxic respiratory failure:  O2 saturation between 81 to 89% on room air, encourage patient to continue oxygen  via nasal cannula.  Oxygen  bumped to 4 L via nasal cannula.  Patient baseline is 90% on 3 L at home. Continue to monitor Anxiety: Stable, continue medication as prescribed.  Code Status: Full Code Family Communication: N/A Disposition Plan: Not yet ready for discharge  [name] Cover NP   If 7PM-7AM, please contact night-coverage.  12/17/2023, 12:33 PM  LOS: 0 days

## 2023-12-17 NOTE — ED Provider Notes (Signed)
 [place] EMERGENCY DEPARTMENT AT [facility] Provider Note   CSN: [id number] Arrival date & time: 12/17/23  0115     Patient presents with: Sickle Cell Pain Crisis   Martin [name] is a 33 y.o. male.   The history is provided by the patient.  Sickle Cell Pain Crisis  He has history of sickle cell disease, HIV disease, pulmonary embolism anticoagulated on apixaban  and comes in complaining of generalized pain typical of his sickle cell disease.  Pain is worst in his lower back and both hips.  He had been admitted to the sickle cell day hospital today but states he really was not feeling any better when he left.  He has taken his oxycodone -acetaminophen  at home without relief.  He he states he did have low-grade fever up to 99.42 days ago.  He denies any chills or sweats.  He denies any cough.  He states that his respiratory status is at his baseline.    Prior to Admission medications   Medication Sig Start Date End Date Taking? Authorizing Provider  abacavir -dolutegravir -lamiVUDine  (TRIUMEQ ) 600-50-300 MG tablet Take 1 tablet by mouth daily. 08/13/23   Pearlean Manus, MD  albuterol  (VENTOLIN  HFA) 108 (90 Base) MCG/ACT inhaler Inhale 2 puffs into the lungs every 6 (six) hours as needed for wheezing or shortness of breath. Patient not taking: Reported on 10/20/2023 08/13/23   Pearlean Manus, MD  apixaban  (ELIQUIS ) 5 MG TABS tablet Take 1 tablet (5 mg total) by mouth 2 (two) times daily. 08/13/23   Pearlean Manus, MD  cyclobenzaprine  (FLEXERIL ) 10 MG tablet Take 10 mg by mouth 2 (two) times daily as needed for muscle spasms. 12/22/22   [provider]  Deferasirox  (JADENU ) 360 MG TABS Take 2 tablets (720 mg total) by mouth daily. 11/05/23   [name], [name], [name]  diphenhydrAMINE  (BENADRYL ) 25 MG tablet Take 1 tablet (25 mg total) by mouth every 4 (four) hours as needed for itching. 08/13/23   Pearlean Manus, MD  folic acid  (FOLVITE ) 400 MCG tablet Take 1 tablet  (400 mcg total) by mouth daily. 08/13/23   Pearlean Manus, MD  gabapentin  (NEURONTIN ) 300 MG capsule Take 1 capsule (300 mg total) by mouth at bedtime as needed (nerve pain). 08/13/23   Pearlean Manus, MD  guaiFENesin  (MUCINEX ) 600 MG 12 hr tablet Take 1 tablet (600 mg total) by mouth 2 (two) times daily. Patient not taking: Reported on 10/20/2023 08/13/23 08/12/24  Pearlean Manus, MD  hydroxyurea  (HYDREA ) 500 MG capsule Take 4 capsules (2,000 mg total) by mouth at bedtime. 08/13/23   Pearlean Manus, MD  NARCAN  4 MG/0.1ML LIQD nasal spray kit Place 1 spray into the nose as needed (respiratory distress / opioid overdose). 01/07/23   [provider]  oxyCODONE -acetaminophen  (PERCOCET) 10-325 MG tablet Take 1 tablet by mouth every 4 (four) hours as needed for pain. 12/03/23   [name], [name], [name]  pantoprazole  (PROTONIX ) 40 MG tablet Take 1 tablet (40 mg total) by mouth daily as needed (acid reflux). Patient not taking: Reported on 10/20/2023 08/13/23   Pearlean Manus, MD  senna-docusate (SENOKOT-S) 8.6-50 MG tablet Take 2 tablets by mouth at bedtime. Patient not taking: Reported on 10/20/2023 08/13/23   Pearlean Manus, MD  triamcinolone  cream (KENALOG ) 0.1 % Apply 1 Application topically 2 (two) times daily. Patient not taking: Reported on 10/27/2023 01/12/23   Tilford Bertram HERO, [name]  Vitamin D , Ergocalciferol , (DRISDOL ) 1.25 MG (50000 UNIT) CAPS capsule Take 1 capsule (50,000 Units total) by mouth  every 7 (seven) days. 11/05/23   [name], [name], [name]  Vitamin D , Ergocalciferol , (DRISDOL ) 1.25 MG (50000 UNIT) CAPS capsule Take 1 capsule (50,000 Units total) by mouth every 7 (seven) days. 11/09/23   [name], [name], [name]    Allergies: Ketorolac  tromethamine , Tape, and Wound dressing adhesive    Review of Systems  All other systems reviewed and are negative.   Updated Vital Signs BP (!) 105/94   Pulse (!) 106   Temp 99.2 F (37.3 C)   Resp 16   SpO2 (!) 78%   Physical  Exam Vitals and nursing note reviewed.   33 year old male, resting comfortably and in no acute distress. Vital signs are significant for slightly elevated heart rate. Oxygen  saturation is 78%, which is hypoxic, but obtained on room air and patient is normally on 3 L oxygen  via nasal cannula.  When placed on nasal oxygen , oxygen  saturation came up to 92% which is adequate. Head is normocephalic and atraumatic. PERRLA, EOMI. Oropharynx is clear. Neck is nontender and supple without adenopathy. Lungs are clear without rales, wheezes, or rhonchi. Chest is nontender. Heart has regular rate and rhythm without murmur. Abdomen is soft, flat, nontender. Extremities have no cyanosis or edema, full range of motion is present. Skin is warm and dry without rash. Neurologic: Mental status is normal, cranial nerves are intact, moves all extremities equally.  (all labs ordered are listed, but only abnormal results are displayed) Labs Reviewed  CBC WITH DIFFERENTIAL/PLATELET - Abnormal; Notable for the following components:      Result Value   WBC 14.2 (*)    RBC 2.17 (*)    Hemoglobin 8.2 (*)    HCT 23.2 (*)    MCV 106.9 (*)    MCH 37.8 (*)    RDW 27.0 (*)    nRBC 21.6 (*)    Neutro Abs 8.9 (*)    Monocytes Absolute 1.3 (*)    All other components within normal limits  COMPREHENSIVE METABOLIC PANEL WITH GFR - Abnormal; Notable for the following components:   Calcium  8.4 (*)    Total Bilirubin 6.4 (*)    All other components within normal limits  RETICULOCYTES - Abnormal; Notable for the following components:   Retic Ct Pct 24.6 (*)    RBC. 2.24 (*)    Retic Count, Absolute 552.0 (*)    Immature Retic Fract 31.4 (*)    All other components within normal limits    EKG Interpretation Date/Time:  Friday December 17 2023 02:05:07 EDT Ventricular Rate:  89 PR Interval:  160 QRS Duration:  87 QT Interval:  398 QTC Calculation: 485 R Axis:   89  Text Interpretation: Sinus rhythm Borderline  prolonged QT interval When compared with ECG of 12/15/2023, No significant change was found Confirmed by Raford Lenis ([id number]) on 12/17/2023 4:55:26 AM          Procedures  Cardiac monitor shows normal sinus rhythm, per my interpretation. Medications Ordered in the ED  dextrose  5 % and 0.45 % NaCl infusion ( Intravenous New Bag/Given 12/17/23 0220)  HYDROmorphone  (DILAUDID ) injection 2 mg (has no administration in time range)  HYDROmorphone  (DILAUDID ) injection 2 mg (2 mg Intravenous Given 12/17/23 0238)  HYDROmorphone  (DILAUDID ) injection 2 mg (2 mg Intravenous Given 12/17/23 0308)  HYDROmorphone  (DILAUDID ) injection 2 mg (2 mg Intravenous Given 12/17/23 0338)  diphenhydrAMINE  (BENADRYL ) 25 mg in sodium chloride  0.9 % 50 mL IVPB (0 mg Intravenous Stopped 12/17/23 0257)  Medical Decision Making Amount and/or Complexity of Data Reviewed Labs: ordered. Radiology: ordered.  Risk Prescription drug management.   Sickle cell pain crisis.  Chronic hypoxia but maintaining adequate oxygen  saturation with supplemental nasal oxygen .  I have ordered a chest x-ray to rule out pneumonia.  I have reviewed his past records, and note dated hospital admission on 12/16/2023 and ED visit for sickle cell crises on 12/11/2023 and 12/15/2023.  This seems to be part of the same crisis, anticipate he will need hospital admission.  I have ordered the sickle cell protocol including full injections of hydromorphone  for pain and screening labs.  I have reviewed his laboratory tests, and my interpretation is stable anemia, stable leukocytosis which is nonspecific, elevated bilirubin which is expected and sickle cell disease and otherwise normal comprehensive metabolic panel, adequate reticulocyte count.  He had an adequate pain relief from 3 doses of hydromorphone .  Patient refused chest x-ray since he states he had 1 done yesterday.  I have reviewed his electrocardiogram, my interpretation  is sinus rhythm with borderline prolonged QT interval and unchanged from prior.  He failed to get adequate pain relief with 3 injections of hydromorphone , will need to be admitted.  I have ordered a fourth dose of hydromorphone .  Case is discussed with Dr. Sundil of [facility], who agrees to admit the patient.     Final diagnoses:  Sickle cell crisis [facility])  Chronic anticoagulation    ED Discharge Orders     None          Raford Lenis, MD 12/17/23 [phone]

## 2023-12-17 NOTE — ED Triage Notes (Signed)
 Pt states that he has been a crisis all week, pain all over, oxygen  was 76% on RA

## 2023-12-17 NOTE — Plan of Care (Signed)

## 2023-12-17 NOTE — H&P (Signed)
 History and Physical    Martin [name] FMW:[id number] DOB: 1991/04/17 DOA: 12/17/2023  PCP: [name], [name], [name]   Patient coming from: Home   Chief Complaint:  Chief Complaint  Patient presents with   Sickle Cell Pain Crisis   ED TRIAGE note:  Pt states that he has been a crisis all week, pain all over, oxygen  was 76% on RA       HPI:  Martin [name] is a 33 y.o. male with medical history significant of sickle cell disease, history of pulmonary embolism, chronic hypoxic respiratory failure 3 L oxygen  at baseline , HIV, generalized anxiety disorder and recurrent hospital admission for sickle cell pain crisis presented to emergency department with complaining of ongoing typical sickle cell pain crisis.  Patient reported he has more severe pain lower back and bilateral hip.  Patient was admitted and discharged on 8/28 yesterday with similar complaint and presentation. Patient reported that while he in the hospital treated with IV Dilaudid  with improved the pain however at home the oral oxycodone  is not relieving his pain.  Also at home he has some low-grade fever.  Denies any chills, sweats, cough, chest pain and palpitation.   ED Course:  At presentation to ED O2 sat of 78% on room air currently 90 to 93% on 4 L oxygen .  Otherwise hemodynamically stable. CBC showing leukocytosis 14.2, stable H&H 8.2 and 23 and normal platelet count 369. CMP unremarkable except elevated bilirubin 6.4 which is chronic in nature. Elevated absolute reticulocyte leukocyte count. Chest x-ray.  Chest x-ray from 8/27 showing chronic appearing increased interstitial lung markings.  In the ED patient received IV Benadryl , Dilaudid  2 mg total 3 doses.  Hospitalist has been consulted for further evaluation management of sickle cell pain crisis.  Significant labs in the ED: Lab Orders         CBC WITH DIFFERENTIAL         Comprehensive metabolic panel         Reticulocytes         CBC          Comprehensive metabolic panel       Review of Systems:  Review of Systems  Constitutional:  Negative for chills, fever, malaise/fatigue and weight loss.  Respiratory:  Negative for cough, sputum production and shortness of breath.   Cardiovascular:  Negative for chest pain, palpitations, orthopnea and leg swelling.  Gastrointestinal:  Negative for abdominal pain, constipation, diarrhea, heartburn, nausea and vomiting.  Genitourinary:  Negative for dysuria, frequency and urgency.  Musculoskeletal:  Positive for back pain and myalgias. Negative for falls, joint pain and neck pain.       Bilateral lower back and hip joint pain  Neurological:  Negative for dizziness and headaches.  Psychiatric/Behavioral:  The patient is not nervous/anxious.   All other systems reviewed and are negative.   Past Medical History:  Diagnosis Date   Anxiety    HIV (human immunodeficiency virus infection) (HCC)    Proteinuria    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HCC)    Vitamin D  deficiency 10/2018    Past Surgical History:  Procedure Laterality Date   IR IMAGING GUIDED PORT INSERTION  08/29/2019   IR REMOVAL TUN ACCESS W/ PORT W/O FL MOD SED  08/30/2020   TEE WITHOUT CARDIOVERSION N/A 08/30/2020   Procedure: TRANSESOPHAGEAL ECHOCARDIOGRAM (TEE);  Surgeon: Mona Vinie BROCKS, MD;  Location: [facility] ENDOSCOPY;  Service: Cardiovascular;  Laterality: N/A;     reports that he has never smoked.  He has never used smokeless tobacco. He reports current drug use. Drug: Marijuana. He reports that he does not drink alcohol .  Allergies  Allergen Reactions   Ketorolac  Tromethamine  Swelling and Other (See Comments)    Patient reports facial edema and left arm edema after administration.   Tape Rash and Other (See Comments)    PLEASE DO NOT USE THE CLEAR, THICK, PLASTIC TAPE- only paper tape is tolerated    Wound Dressing Adhesive Rash    Family History  Problem Relation Age of Onset   Sickle cell trait Mother    Sickle cell  trait Father    Birth defects Maternal Grandmother    Birth defects Paternal Grandmother     Prior to Admission medications   Medication Sig Start Date End Date Taking? Authorizing Provider  abacavir -dolutegravir -lamiVUDine  (TRIUMEQ ) 600-50-300 MG tablet Take 1 tablet by mouth daily. 08/13/23   Pearlean Manus, MD  albuterol  (VENTOLIN  HFA) 108 (90 Base) MCG/ACT inhaler Inhale 2 puffs into the lungs every 6 (six) hours as needed for wheezing or shortness of breath. Patient not taking: Reported on 10/20/2023 08/13/23   Pearlean Manus, MD  apixaban  (ELIQUIS ) 5 MG TABS tablet Take 1 tablet (5 mg total) by mouth 2 (two) times daily. 08/13/23   Pearlean Manus, MD  cyclobenzaprine  (FLEXERIL ) 10 MG tablet Take 10 mg by mouth 2 (two) times daily as needed for muscle spasms. 12/22/22   [provider]  Deferasirox  (JADENU ) 360 MG TABS Take 2 tablets (720 mg total) by mouth daily. 11/05/23   [name], [name], [name]  diphenhydrAMINE  (BENADRYL ) 25 MG tablet Take 1 tablet (25 mg total) by mouth every 4 (four) hours as needed for itching. 08/13/23   Pearlean Manus, MD  folic acid  (FOLVITE ) 400 MCG tablet Take 1 tablet (400 mcg total) by mouth daily. 08/13/23   Pearlean Manus, MD  gabapentin  (NEURONTIN ) 300 MG capsule Take 1 capsule (300 mg total) by mouth at bedtime as needed (nerve pain). 08/13/23   Pearlean Manus, MD  guaiFENesin  (MUCINEX ) 600 MG 12 hr tablet Take 1 tablet (600 mg total) by mouth 2 (two) times daily. Patient not taking: Reported on 10/20/2023 08/13/23 08/12/24  Pearlean Manus, MD  hydroxyurea  (HYDREA ) 500 MG capsule Take 4 capsules (2,000 mg total) by mouth at bedtime. 08/13/23   Pearlean Manus, MD  NARCAN  4 MG/0.1ML LIQD nasal spray kit Place 1 spray into the nose as needed (respiratory distress / opioid overdose). 01/07/23   [provider]  oxyCODONE -acetaminophen  (PERCOCET) 10-325 MG tablet Take 1 tablet by mouth every 4 (four) hours as needed for pain. 12/03/23   [name],  [name], [name]  pantoprazole  (PROTONIX ) 40 MG tablet Take 1 tablet (40 mg total) by mouth daily as needed (acid reflux). Patient not taking: Reported on 10/20/2023 08/13/23   Pearlean Manus, MD  senna-docusate (SENOKOT-S) 8.6-50 MG tablet Take 2 tablets by mouth at bedtime. Patient not taking: Reported on 10/20/2023 08/13/23   Pearlean Manus, MD  triamcinolone  cream (KENALOG ) 0.1 % Apply 1 Application topically 2 (two) times daily. Patient not taking: Reported on 10/27/2023 01/12/23   Tilford Bertram HERO, [name]  Vitamin D , Ergocalciferol , (DRISDOL ) 1.25 MG (50000 UNIT) CAPS capsule Take 1 capsule (50,000 Units total) by mouth every 7 (seven) days. 11/05/23   [name], [name], [name]  Vitamin D , Ergocalciferol , (DRISDOL ) 1.25 MG (50000 UNIT) CAPS capsule Take 1 capsule (50,000 Units total) by mouth every 7 (seven) days. 11/09/23   [name], [name], [name]     Physical  Exam: Vitals:   12/17/23 0125 12/17/23 0206 12/17/23 0230 12/17/23 0330  BP: (!) 105/94  100/67 115/74  Pulse: (!) 106  83 83  Resp: 16  16 16   Temp: 99.2 F (37.3 C)     SpO2: (!) 78%  93% 90%  Weight:  65.3 kg      Physical Exam Vitals and nursing note reviewed.  Constitutional:      General: He is not in acute distress.    Appearance: Normal appearance. He is ill-appearing.  HENT:     Mouth/Throat:     Mouth: Mucous membranes are moist.  Eyes:     Pupils: Pupils are equal, round, and reactive to light.  Cardiovascular:     Rate and Rhythm: Normal rate and regular rhythm.     Pulses: Normal pulses.     Heart sounds: Normal heart sounds.  Pulmonary:     Effort: Pulmonary effort is normal.     Breath sounds: Normal breath sounds.  Abdominal:     Palpations: Abdomen is soft.     Tenderness: There is no abdominal tenderness.  Musculoskeletal:        General: No tenderness.     Cervical back: Neck supple.     Right lower leg: No edema.     Left lower leg: No edema.  Skin:    General: Skin is warm.     Capillary  Refill: Capillary refill takes less than 2 seconds.     Coloration: Skin is not pale.     Findings: No erythema or rash.  Neurological:     Mental Status: He is alert and oriented to person, place, and time.  Psychiatric:        Mood and Affect: Mood normal.        Thought Content: Thought content normal.      Labs on Admission: I have personally reviewed following labs and imaging studies  CBC: Recent Labs  Lab 12/11/23 0303 12/15/23 0203 12/17/23 0212  WBC 9.1 14.0* 14.2*  NEUTROABS 5.3 7.2 8.9*  HGB 7.6* 7.8* 8.2*  HCT 21.0* 21.9* 23.2*  MCV 106.6* 108.4* 106.9*  PLT 292 404* 369   Basic Metabolic Panel: Recent Labs  Lab 12/11/23 0303 12/15/23 0203 12/17/23 0212  NA 140 142 141  K 4.1 3.7 3.8  CL 109 109 107  CO2 24 22 23   GLUCOSE 99 94 99  BUN 27* 11 7  CREATININE 0.98 0.84 0.79  CALCIUM  8.7* 8.8* 8.4*   GFR: Estimated Creatinine Clearance: 121.3 mL/min (by C-G formula based on SCr of 0.79 mg/dL). Liver Function Tests: Recent Labs  Lab 12/11/23 0303 12/15/23 0203 12/17/23 0212  AST 36 29 25  ALT 17 15 13   ALKPHOS 47 62 61  BILITOT 6.3* 5.9* 6.4*  PROT 7.7 7.5 7.3  ALBUMIN 3.9 4.1 4.0   No results for input(s): LIPASE, AMYLASE in the last 168 hours. No results for input(s): AMMONIA in the last 168 hours. Coagulation Profile: Recent Labs  Lab 12/11/23 0303  INR 1.3*   Cardiac Enzymes: No results for input(s): CKTOTAL, CKMB, CKMBINDEX, TROPONINI, TROPONINIHS in the last 168 hours. BNP (last 3 results) No results for input(s): BNP in the last 8760 hours. HbA1C: No results for input(s): HGBA1C in the last 72 hours. CBG: No results for input(s): GLUCAP in the last 168 hours. Lipid Profile: No results for input(s): CHOL, HDL, LDLCALC, [name], CHOLHDL, LDLDIRECT in the last 72 hours. Thyroid Function Tests: No results for input(s): TSH, T4TOTAL,  FREET4, T3FREE, THYROIDAB in the last 72 hours. Anemia  Panel: Recent Labs    12/15/23 0203 12/17/23 0212  RETICCTPCT 17.8* 24.6*   Urine analysis:    Component Value Date/Time   COLORURINE AMBER (A) 06/20/2023 0311   APPEARANCEUR CLOUDY (A) 06/20/2023 0311   LABSPEC 1.014 06/20/2023 0311   PHURINE 5.0 06/20/2023 0311   GLUCOSEU NEGATIVE 06/20/2023 0311   HGBUR MODERATE (A) 06/20/2023 0311   BILIRUBINUR NEGATIVE 06/20/2023 0311   BILIRUBINUR small (A) 03/18/2021 1218   BILIRUBINUR neg 11/27/2019 1149   KETONESUR NEGATIVE 06/20/2023 0311   PROTEINUR 100 (A) 06/20/2023 0311   UROBILINOGEN 2.0 (A) 03/18/2021 1218   UROBILINOGEN 4.0 (H) 05/21/2009 1944   NITRITE NEGATIVE 06/20/2023 0311   LEUKOCYTESUR NEGATIVE 06/20/2023 0311    Radiological Exams on Admission: I have personally reviewed images No results found.   EKG: My personal interpretation of EKG shows:  EKG showing normal sinus rhythm heart rate 89    Assessment/Plan: Principal Problem:   Sickle cell pain crisis (HCC) Active Problems:   Sickle cell disease with crisis (HCC)   HIV (human immunodeficiency virus infection) (HCC)   History of pulmonary embolism   Generalized anxiety disorder   GERD (gastroesophageal reflux disease)   Chronic pain syndrome   Chronic hypoxic respiratory failure (HCC)    Assessment and Plan: Sickle cell pain crisis History of sickle cell disease Anemia of chronic disease -Present emergency department pain all over and concern for persistence of sickle cell pain crisis.  Patient was admitted discharge 8/28 with similar presentation and complaint.  At presentation to ED O2 sat 78% room air and after placing 4 L oxygen  O2 sat improved to 94%.  Patient denies any cough, fever, chill and chest pain.  Complaining about he is hurting all over. -CBC showing leukocytosis 14.2, stable H&H 8.2 and 23 and normal platelet count 369. CMP unremarkable except elevated bilirubin 6.4 which is chronic in nature. Elevated absolute reticulocyte leukocyte  count. Chest x-ray.  Chest x-ray from 8/27 showing chronic appearing increased interstitial lung markings. -In the ED patient received IV Benadryl , Dilaudid  2 mg total 3 doses. -Starting Dilaudid  PCA pump. - Patient is allergic to Toradol .  Giving Tylenol  IV 1000 mg 1 dose. - Continue Narcan  as needed, Zofran  as needed, Benadryl  as needed.  Continue check pulse ox and supplemental oxygen  as needed. - Continue to check ETCO2 for the duration of PCA. -Continue hydroxyurea  and folic acid . - Starting half-normal NS maintenance fluid 125 cc/h. Continue monitor CBC and improvement of pain sickle cell pain crisis.   History of PE -Continue Eliquis  twice daily.  History of HIV - ContinueTriumeq once daily.  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 - Continue to check pulse ox, supplemental oxygen  3 to 4 L keep O2 sat above 94%. -Need to follow-up with chest x-ray.  Continue albuterol  as needed.    DVT prophylaxis:  Eliquis  Code Status:  Full Code Diet: Regular diet Disposition Plan: Continue to monitor improvement of sickle cell pain crisis. Consults: Sickle cell team Admission status:   Observation, progressive unit  Severity of Illness: The appropriate patient status for this patient is OBSERVATION. Observation status is judged to be reasonable and necessary in order to provide the required intensity of service  to ensure the patient's safety. The patient's presenting symptoms, physical exam findings, and initial radiographic and laboratory data in the context of their medical condition is felt to place them at decreased risk for further clinical deterioration. Furthermore, it is anticipated that the patient  will be medically stable for discharge from the hospital within 2 midnights of admission.     Katiya Fike, MD [facility]  How to contact the [facility] Attending or Consulting provider 7A - 7P or covering provider during after hours 7P -7A, for this patient.  Check the care team in [facility] and look for a) attending/consulting [facility] provider listed and b) the [facility] team listed Log into [email or web address] and use [facility] universal password to access. If you do not have the password, please contact the hospital operator. Locate the [facility] provider you are looking for under [facility] and page to a number that you can be directly reached. If you still have difficulty reaching the provider, please page the [facility] (Director on Call) for the Hospitalists listed on amion for assistance.  12/17/2023, 6:36 AM

## 2023-12-17 NOTE — Progress Notes (Signed)
   12/17/23 0849  TOC Brief Assessment  Insurance and Status Lapsed  Patient has primary care physician Yes  Home environment has been reviewed Resides in single family home  Prior level of function: Independent  Prior/Current Home Services No current home services  Social Drivers of Health Review SDOH reviewed no interventions necessary  Readmission risk has been reviewed Yes  Transition of care needs no transition of care needs at this time

## 2023-12-18 ENCOUNTER — Other Ambulatory Visit (HOSPITAL_COMMUNITY): Payer: Self-pay

## 2023-12-18 DIAGNOSIS — Z7901 Long term (current) use of anticoagulants: Secondary | ICD-10-CM

## 2023-12-18 LAB — CBC
HCT: 22.7 % — ABNORMAL LOW (ref 39.0–52.0)
Hemoglobin: 8.2 g/dL — ABNORMAL LOW (ref 13.0–17.0)
MCH: 38.7 pg — ABNORMAL HIGH (ref 26.0–34.0)
MCHC: 36.1 g/dL — ABNORMAL HIGH (ref 30.0–36.0)
MCV: 107.1 fL — ABNORMAL HIGH (ref 80.0–100.0)
Platelets: 345 K/uL (ref 150–400)
RBC: 2.12 MIL/uL — ABNORMAL LOW (ref 4.22–5.81)
RDW: 25.4 % — ABNORMAL HIGH (ref 11.5–15.5)
WBC: 9.6 K/uL (ref 4.0–10.5)
nRBC: 14.5 % — ABNORMAL HIGH (ref 0.0–0.2)

## 2023-12-18 LAB — COMPREHENSIVE METABOLIC PANEL WITH GFR
ALT: 13 U/L (ref 0–44)
AST: 22 U/L (ref 15–41)
Albumin: 3.8 g/dL (ref 3.5–5.0)
Alkaline Phosphatase: 57 U/L (ref 38–126)
Anion gap: 9 (ref 5–15)
BUN: 8 mg/dL (ref 6–20)
CO2: 23 mmol/L (ref 22–32)
Calcium: 8.3 mg/dL — ABNORMAL LOW (ref 8.9–10.3)
Chloride: 107 mmol/L (ref 98–111)
Creatinine, Ser: 0.75 mg/dL (ref 0.61–1.24)
GFR, Estimated: >60 mL/min (ref >60)
Glucose, Bld: 85 mg/dL (ref 70–99)
Potassium: 4.2 mmol/L (ref 3.5–5.1)
Sodium: 139 mmol/L (ref 135–145)
Total Bilirubin: 4.4 mg/dL — ABNORMAL HIGH (ref 0.0–1.2)
Total Protein: 6.9 g/dL (ref 6.5–8.1)

## 2023-12-18 NOTE — Plan of Care (Signed)
  Problem: Self-Care: Goal: Ability to incorporate actions that prevent/reduce pain crisis will improve Outcome: Progressing   Problem: Bowel/Gastric: Goal: Gut motility will be maintained Outcome: Progressing   Problem: Clinical Measurements: Goal: Diagnostic test results will improve Outcome: Progressing   Problem: Nutrition: Goal: Adequate nutrition will be maintained Outcome: Progressing   Problem: Coping: Goal: Level of anxiety will decrease Outcome: Progressing   Problem: Elimination: Goal: Will not experience complications related to bowel motility Outcome: Progressing

## 2023-12-18 NOTE — Progress Notes (Signed)
 Patient ID: Martin [name], male   DOB: January 05, 1991, 33 y.o.   MRN: [id number] Subjective: Martin [name]  is a 33 y.o. male with a medical history significant for sickle cell anemia with history of acute chest syndrome, multiple sickle cell crisis, multiple ED visits and hospital admission, history of PE currently on Eliquis , chronic hypoxic respiratory failure on home oxygen , HIV infection and generalized anxiety disorder who was admitted for sickle cell pain crisis.  This morning, patient claims he is feeling much better but still in significant amount of pain.  His pain is mostly in his lower back and lower extremities.  His pain is now at 7/10, characterized as throbbing and achy.  He is beginning to get his appetite back.  He denies any fever, cough, chest pain, shortness of breath, nausea, vomiting or diarrhea.  No urinary symptoms.  Objective:  Vital signs in last 24 hours:  Vitals:   12/18/23 0613 12/18/23 0821 12/18/23 1229 12/18/23 1250  BP: 103/68   107/62  Pulse: 68   79  Resp: 18 17 15 20   Temp: 98.2 F (36.8 C)   98.2 F (36.8 C)  TempSrc: Oral   Oral  SpO2: 93% 93% 95% 97%  Weight:        Intake/Output from previous day:   Intake/Output Summary (Last 24 hours) at 12/18/2023 1544 Last data filed at 12/18/2023 1233 Gross per 24 hour  Intake 624.56 ml  Output 2150 ml  Net -1525.44 ml    Physical Exam: General: Alert, awake, oriented x3, in no acute distress.  HEENT: Martin [name], EOMI, Mild icterus + Neck: Trachea midline,  no masses, no thyromegal,y no JVD, no carotid bruit OROPHARYNX:  Moist, No exudate/ erythema/lesions.  Heart: Regular rate and rhythm, without murmurs, rubs, gallops, PMI non-displaced, no heaves or thrills on palpation.  Lungs: Clear to auscultation, no wheezing or rhonchi noted. No increased vocal fremitus resonant to percussion  Abdomen: Soft, nontender, nondistended, positive bowel sounds, no masses no hepatosplenomegaly noted..  Neuro:  No focal neurological deficits noted cranial nerves II through XII grossly intact. DTRs 2+ bilaterally upper and lower extremities. Strength 5 out of 5 in bilateral upper and lower extremities. Musculoskeletal: No warm swelling or erythema around joints, no spinal tenderness noted. Psychiatric: Patient alert and oriented x3, good insight and cognition, good recent to remote recall. Lymph node survey: No cervical axillary or inguinal lymphadenopathy noted.  Lab Results:  Basic Metabolic Panel:    Component Value Date/Time   NA 139 12/18/2023 0332   NA 140 01/12/2023 1607   K 4.2 12/18/2023 0332   CL 107 12/18/2023 0332   CO2 23 12/18/2023 0332   BUN 8 12/18/2023 0332   BUN 8 01/12/2023 1607   CREATININE 0.75 12/18/2023 0332   GLUCOSE 85 12/18/2023 0332   CALCIUM  8.3 (L) 12/18/2023 0332   CBC:    Component Value Date/Time   WBC 9.6 12/18/2023 0332   HGB 8.2 (L) 12/18/2023 0332   HGB 7.4 (L) 01/12/2023 1607   HCT 22.7 (L) 12/18/2023 0332   HCT 21.7 (L) 01/12/2023 1607   PLT 345 12/18/2023 0332   PLT 321 01/12/2023 1607   MCV 107.1 (H) 12/18/2023 0332   MCV 94 01/12/2023 1607   NEUTROABS 8.9 (H) 12/17/2023 0212   NEUTROABS 3.8 01/12/2023 1607   LYMPHSABS 3.8 12/17/2023 0212   LYMPHSABS 5.6 (H) 01/12/2023 1607   MONOABS 1.3 (H) 12/17/2023 0212   EOSABS 0.1 12/17/2023 0212   EOSABS 0.2 01/12/2023 1607  BASOSABS 0.1 12/17/2023 0212   BASOSABS 0.1 01/12/2023 1607    No results found for this or any previous visit (from the past 240 hours).  Studies/Results: No results found.  Medications: Scheduled Meds:  abacavir -dolutegravir -lamiVUDine   1 tablet Oral Daily   apixaban   5 mg Oral BID   Chlorhexidine  Gluconate Cloth  6 each Topical Daily   folic acid   500 mcg Oral Daily   HYDROmorphone    Intravenous Q4H   hydroxyurea   2,000 mg Oral QHS   senna-docusate  2 tablet Oral QHS   sodium chloride  flush  10-40 mL Intracatheter Q12H   sodium chloride  flush  3 mL Intravenous  Q12H   Continuous Infusions: PRN Meds:.acetaminophen  **OR** acetaminophen , albuterol , benzocaine , bisacodyl , cyclobenzaprine , gabapentin , naloxone  **AND** sodium chloride  flush, ondansetron  **OR** ondansetron  (ZOFRAN ) IV, pantoprazole , sodium chloride  flush  Consultants: None  Procedures: None  Antibiotics: None  Assessment/Plan: Principal Problem:   Sickle cell pain crisis (HCC) Active Problems:   Sickle cell disease with crisis (HCC)   Generalized anxiety disorder   GERD (gastroesophageal reflux disease)   HIV (human immunodeficiency virus infection) (HCC)   Chronic pain syndrome   History of pulmonary embolism   Anxiety   Sickle cell anemia with pain (HCC)   Chronic hypoxic respiratory failure (HCC)  Hb Sickle Cell Disease with Pain crisis: Reduce IVF 0.45% Saline to KVO, continue weight based Dilaudid  PCA at current dose setting, continue IV Toradol  15 mg Q 6 H for a total of 5 days, continue oral home pain medications as ordered. Monitor vitals very closely, Re-evaluate pain scale regularly, 2 L of Oxygen  by [place]. Patient encouraged to ambulate on the hallway today.  Leukocytosis: Resolved. Anemia of Chronic Disease: Hemoglobin is stable at baseline today, there is no clinical indication for blood transfusion at this time.  Will continue to monitor closely and transfuse as appropriate. Chronic pain Syndrome: Continue oral home pain medications as ordered. Asymptomatic HIV infection: Clinically stable.  Will continue home medication. Chronic hypoxic respiratory failure: Patient has now started to saturate well, on 3 L of oxygen .  Continue incentive spirometry.  Ambulate on the hallway. Generalized anxiety disorder: Patient is clinically stable.  He denies any suicidal ideations or thoughts.  Continue home medications. Hyperbilirubinemia: Patient has chronically elevated total bilirubin, 4.4 today, slightly lower than on admission of 6.4.  Will continue to monitor very  closely. History of pulmonary embolism: Continue anticoagulation.  Code Status: Full Code Family Communication: N/A Disposition Plan: Not yet ready for discharge  Martin [name]  If 7PM-7AM, please contact night-coverage.  12/18/2023, 3:44 PM  LOS: 1 day

## 2023-12-18 NOTE — Plan of Care (Signed)
?  Problem: Education: ?Goal: Knowledge of vaso-occlusive preventative measures will improve ?Outcome: Progressing ?Goal: Awareness of infection prevention will improve ?Outcome: Progressing ?Goal: Awareness of signs and symptoms of anemia will improve ?Outcome: Progressing ?Goal: Long-term complications will improve ?Outcome: Progressing ?  ?Problem: Self-Care: ?Goal: Ability to incorporate actions that prevent/reduce pain crisis will improve ?Outcome: Progressing ?  ?

## 2023-12-19 MED ORDER — CARMEX CLASSIC LIP BALM EX OINT
TOPICAL_OINTMENT | CUTANEOUS | Status: DC | PRN
  Administered 2023-12-23: 1 via TOPICAL
  Filled 2023-12-19 (×2): qty 10

## 2023-12-19 NOTE — Progress Notes (Signed)
 Patient ID: Martin [name], male   DOB: [birth year], 33 y.o.   MRN: [id number] Subjective: Martin [name]  is a 33 y.o. male with a medical history significant for sickle cell anemia with history of acute chest syndrome, multiple sickle cell crisis, multiple ED visits and hospital admission, history of PE currently on Eliquis , chronic hypoxic respiratory failure on home oxygen , HIV infection and generalized anxiety disorder who was admitted for sickle cell pain crisis.  Patient is feeling much better today.  He said his pain is improving but still not at baseline.  He has no new concern.  He is tolerating p.o. intake well.  He denies any fever, cough, chest pain, shortness of breath, nausea, vomiting or diarrhea.  Objective:  Vital signs in last 24 hours:  Vitals:   12/19/23 0501 12/19/23 0903 12/19/23 0918 12/19/23 1220  BP: 117/73 113/75    Pulse: 100 68    Resp: 16 15 14 16   Temp: 98.2 F (36.8 C) 97.7 F (36.5 C)    TempSrc: Oral Oral    SpO2: (!) 83% 100% 100% 100%  Weight:      Height:        Intake/Output from previous day:   Intake/Output Summary (Last 24 hours) at 12/19/2023 1227 Last data filed at 12/19/2023 1100 Gross per 24 hour  Intake 240 ml  Output 2325 ml  Net -2085 ml    Physical Exam: General: Alert, awake, oriented x3, in no acute distress.  HEENT: [place]/AT PEERL, EOMI, Mild icterus + Neck: Trachea midline,  no masses, no thyromegal,y no JVD, no carotid bruit OROPHARYNX:  Moist, No exudate/ erythema/lesions.  Heart: Regular rate and rhythm, without murmurs, rubs, gallops, PMI non-displaced, no heaves or thrills on palpation.  Lungs: Clear to auscultation, no wheezing or rhonchi noted. No increased vocal fremitus resonant to percussion  Abdomen: Soft, nontender, nondistended, positive bowel sounds, no masses no hepatosplenomegaly noted..  Neuro: No focal neurological deficits noted cranial nerves II through XII grossly intact. DTRs 2+ bilaterally upper and  lower extremities. Strength 5 out of 5 in bilateral upper and lower extremities. Musculoskeletal: No warm swelling or erythema around joints, no spinal tenderness noted. Psychiatric: Patient alert and oriented x3, good insight and cognition, good recent to remote recall. Lymph node survey: No cervical axillary or inguinal lymphadenopathy noted.  Lab Results:  Basic Metabolic Panel:    Component Value Date/Time   NA 139 12/18/2023 0332   NA 140 01/12/2023 1607   K 4.2 12/18/2023 0332   CL 107 12/18/2023 0332   CO2 23 12/18/2023 0332   BUN 8 12/18/2023 0332   BUN 8 01/12/2023 1607   CREATININE 0.75 12/18/2023 0332   GLUCOSE 85 12/18/2023 0332   CALCIUM  8.3 (L) 12/18/2023 0332   CBC:    Component Value Date/Time   WBC 9.6 12/18/2023 0332   HGB 8.2 (L) 12/18/2023 0332   HGB 7.4 (L) 01/12/2023 1607   HCT 22.7 (L) 12/18/2023 0332   HCT 21.7 (L) 01/12/2023 1607   PLT 345 12/18/2023 0332   PLT 321 01/12/2023 1607   MCV 107.1 (H) 12/18/2023 0332   MCV 94 01/12/2023 1607   NEUTROABS 8.9 (H) 12/17/2023 0212   NEUTROABS 3.8 01/12/2023 1607   LYMPHSABS 3.8 12/17/2023 0212   LYMPHSABS 5.6 (H) 01/12/2023 1607   MONOABS 1.3 (H) 12/17/2023 0212   EOSABS 0.1 12/17/2023 0212   EOSABS 0.2 01/12/2023 1607   BASOSABS 0.1 12/17/2023 0212   BASOSABS 0.1 01/12/2023 1607  No results found for this or any previous visit (from the past 240 hours).  Studies/Results: No results found.  Medications: Scheduled Meds:  abacavir -dolutegravir -lamiVUDine   1 tablet Oral Daily   apixaban   5 mg Oral BID   Chlorhexidine  Gluconate Cloth  6 each Topical Daily   folic acid   500 mcg Oral Daily   HYDROmorphone    Intravenous Q4H   hydroxyurea   2,000 mg Oral QHS   senna-docusate  2 tablet Oral QHS   sodium chloride  flush  10-40 mL Intracatheter Q12H   sodium chloride  flush  3 mL Intravenous Q12H   Continuous Infusions: PRN Meds:.acetaminophen  **OR** acetaminophen , albuterol , benzocaine ,  cyclobenzaprine , gabapentin , naloxone  **AND** sodium chloride  flush, ondansetron  **OR** ondansetron  (ZOFRAN ) IV, pantoprazole , sodium chloride  flush  Consultants: None  Procedures: None  Antibiotics: None  Assessment/Plan: Principal Problem:   Sickle cell pain crisis (HCC) Active Problems:   Sickle cell disease with crisis (HCC)   Generalized anxiety disorder   GERD (gastroesophageal reflux disease)   HIV (human immunodeficiency virus infection) (HCC)   Chronic pain syndrome   History of pulmonary embolism   Anxiety   Sickle cell anemia with pain (HCC)   Chronic hypoxic respiratory failure (HCC)   Chronic anticoagulation  Hb Sickle Cell Disease with Pain crisis: Transfer to 6 E.  IV fluid at KVO.  Continue weight based Dilaudid  PCA at current dose setting, continue IV Toradol  15 mg Q 6 H for a total of 5 days, continue oral home pain medications as ordered. Monitor vitals very closely, Re-evaluate pain scale regularly, 2 L of Oxygen  by [place]. Patient encouraged to ambulate on the hallway today.  Leukocytosis: Resolved.  Repeat labs in AM. Anemia of Chronic Disease: Hemoglobin continues to be stable at baseline, there is no clinical indication for blood transfusion at this time.  Will continue to monitor closely and transfuse as appropriate.  Repeat lab in AM. Chronic pain Syndrome: Continue oral home pain medications as ordered. Asymptomatic HIV infection: Clinically stable.  Will continue home medication. Chronic hypoxic respiratory failure: Patient continues to saturate well, now on 2 L of oxygen . Continue incentive spirometry.  Ambulate on the hallway. Generalized anxiety disorder: Clinically stable.  Patient denies any suicidal ideations or thoughts.  Continue home medications. Hyperbilirubinemia: Patient has chronically elevated total bilirubin.  Since hemoglobin is stable at baseline, will continue to monitor very closely. History of pulmonary embolism: Continue  anticoagulation.  Code Status: Full Code Family Communication: N/A Disposition Plan: Not yet ready for discharge  [name]  If 7PM-7AM, please contact night-coverage.  12/19/2023, 12:27 PM  LOS: 2 days

## 2023-12-20 LAB — COMPREHENSIVE METABOLIC PANEL WITH GFR
ALT: 13 U/L (ref 0–44)
AST: 38 U/L (ref 15–41)
Albumin: 4.1 g/dL (ref 3.5–5.0)
Alkaline Phosphatase: 59 U/L (ref 38–126)
Anion gap: 12 (ref 5–15)
BUN: 11 mg/dL (ref 6–20)
CO2: 23 mmol/L (ref 22–32)
Calcium: 9 mg/dL (ref 8.9–10.3)
Chloride: 105 mmol/L (ref 98–111)
Creatinine, Ser: 0.88 mg/dL (ref 0.61–1.24)
GFR, Estimated: >60 mL/min (ref >60)
Glucose, Bld: 93 mg/dL (ref 70–99)
Potassium: 4.3 mmol/L (ref 3.5–5.1)
Sodium: 140 mmol/L (ref 135–145)
Total Bilirubin: 6 mg/dL — ABNORMAL HIGH (ref 0.0–1.2)
Total Protein: 7.6 g/dL (ref 6.5–8.1)

## 2023-12-20 LAB — CBC
HCT: 22.6 % — ABNORMAL LOW (ref 39.0–52.0)
Hemoglobin: 8 g/dL — ABNORMAL LOW (ref 13.0–17.0)
MCH: 36 pg — ABNORMAL HIGH (ref 26.0–34.0)
MCHC: 35.4 g/dL (ref 30.0–36.0)
MCV: 101.8 fL — ABNORMAL HIGH (ref 80.0–100.0)
Platelets: 288 K/uL (ref 150–400)
RBC: 2.22 MIL/uL — ABNORMAL LOW (ref 4.22–5.81)
RDW: 25 % — ABNORMAL HIGH (ref 11.5–15.5)
WBC: 10.5 K/uL (ref 4.0–10.5)
nRBC: 3.2 % — ABNORMAL HIGH (ref 0.0–0.2)

## 2023-12-20 MED ORDER — GLYCERIN (LAXATIVE) 2 G RE SUPP
1.0000 | Freq: Once | RECTAL | Status: AC
  Administered 2023-12-20: 1 via RECTAL
  Filled 2023-12-20: qty 1

## 2023-12-20 MED ORDER — OXYCODONE HCL 5 MG PO TABS
5.0000 mg | ORAL_TABLET | ORAL | Status: DC | PRN
  Administered 2023-12-20 – 2023-12-24 (×14): 5 mg via ORAL
  Filled 2023-12-20 (×14): qty 1

## 2023-12-20 MED ORDER — OXYCODONE-ACETAMINOPHEN 10-325 MG PO TABS: 1.0000 | ORAL_TABLET | ORAL | Status: DC | PRN

## 2023-12-20 MED ORDER — OXYCODONE-ACETAMINOPHEN 5-325 MG PO TABS
1.0000 | ORAL_TABLET | ORAL | Status: DC | PRN
  Administered 2023-12-20 – 2023-12-24 (×14): 1 via ORAL
  Filled 2023-12-20 (×14): qty 1

## 2023-12-20 NOTE — Progress Notes (Cosign Needed)
 Patient ID: Martin [name], male   DOB: 21-Apr-1990, 33 y.o.   MRN: [id number] Subjective: Martin [name]  is a 33 y.o. male with a medical history significant for sickle cell anemia with history of acute chest syndrome, multiple sickle cell crisis, multiple ED visits and hospital admission, history of PE currently on Eliquis , chronic hypoxic respiratory failure on home oxygen , HIV infection and generalized anxiety disorder who was admitted for sickle cell pain crisis.  Patient continues to endorse pain mostly in his lower back and lower extremities.  His pain is now at 7/10 today characterized as throbbing and achy. He denies any fever, cough, chest pain, shortness of breath, nausea, vomiting or diarrhea.  No urinary symptoms.  Objective:  Vital signs in last 24 hours:  Vitals:   12/20/23 0536 12/20/23 0748 12/20/23 1013 12/20/23 1131  BP:    98/68  Pulse:    89  Resp: 16 16 16 14   Temp:    98.1 F (36.7 C)  TempSrc:    Oral  SpO2: 93% 93% 100% 92%  Weight:      Height:        Intake/Output from previous day:   Intake/Output Summary (Last 24 hours) at 12/20/2023 1135 Last data filed at 12/20/2023 1000 Gross per 24 hour  Intake 491.5 ml  Output 2500 ml  Net -2008.5 ml    Physical Exam: General: Alert, awake, oriented x3, in no acute distress.  HEENT: [place]/AT PEERL, EOMI, Mild icterus + Neck: Trachea midline,  no masses, no thyromegal,y no JVD, no carotid bruit OROPHARYNX:  Moist, No exudate/ erythema/lesions.  Heart: Regular rate and rhythm, without murmurs, rubs, gallops, PMI non-displaced, no heaves or thrills on palpation.  Lungs: Clear to auscultation, no wheezing or rhonchi noted. No increased vocal fremitus resonant to percussion  Abdomen: Soft, nontender, nondistended, positive bowel sounds, no masses no hepatosplenomegaly noted..  Neuro: No focal neurological deficits noted cranial nerves II through XII grossly intact. DTRs 2+ bilaterally upper and lower extremities.  Strength 5 out of 5 in bilateral upper and lower extremities. Musculoskeletal:Lower back / lower extremity tenderness Psychiatric: Patient alert and oriented x3, good insight and cognition, good recent to remote recall. Lymph node survey: No cervical axillary or inguinal lymphadenopathy noted.  Lab Results:  Basic Metabolic Panel:    Component Value Date/Time   NA 140 12/20/2023 0541   NA 140 01/12/2023 1607   K 4.3 12/20/2023 0541   CL 105 12/20/2023 0541   CO2 23 12/20/2023 0541   BUN 11 12/20/2023 0541   BUN 8 01/12/2023 1607   CREATININE 0.88 12/20/2023 0541   GLUCOSE 93 12/20/2023 0541   CALCIUM  9.0 12/20/2023 0541   CBC:    Component Value Date/Time   WBC 10.5 12/20/2023 0541   HGB 8.0 (L) 12/20/2023 0541   HGB 7.4 (L) 01/12/2023 1607   HCT 22.6 (L) 12/20/2023 0541   HCT 21.7 (L) 01/12/2023 1607   PLT 288 12/20/2023 0541   PLT 321 01/12/2023 1607   MCV 101.8 (H) 12/20/2023 0541   MCV 94 01/12/2023 1607   NEUTROABS 8.9 (H) 12/17/2023 0212   NEUTROABS 3.8 01/12/2023 1607   LYMPHSABS 3.8 12/17/2023 0212   LYMPHSABS 5.6 (H) 01/12/2023 1607   MONOABS 1.3 (H) 12/17/2023 0212   EOSABS 0.1 12/17/2023 0212   EOSABS 0.2 01/12/2023 1607   BASOSABS 0.1 12/17/2023 0212   BASOSABS 0.1 01/12/2023 1607    No results found for this or any previous visit (from the past 240  hours).  Studies/Results: No results found.  Medications: Scheduled Meds:  abacavir -dolutegravir -lamiVUDine   1 tablet Oral Daily   apixaban   5 mg Oral BID   Chlorhexidine  Gluconate Cloth  6 each Topical Daily   folic acid   500 mcg Oral Daily   HYDROmorphone    Intravenous Q4H   hydroxyurea   2,000 mg Oral QHS   senna-docusate  2 tablet Oral QHS   sodium chloride  flush  10-40 mL Intracatheter Q12H   sodium chloride  flush  3 mL Intravenous Q12H   Continuous Infusions: PRN Meds:.acetaminophen  **OR** acetaminophen , albuterol , benzocaine , cyclobenzaprine , gabapentin , lip balm, naloxone  **AND** sodium  chloride flush, ondansetron  **OR** ondansetron  (ZOFRAN ) IV, oxyCODONE -acetaminophen  **AND** oxyCODONE , pantoprazole , sodium chloride  flush  Consultants: None  Procedures: None  Antibiotics: None  Assessment/Plan: Principal Problem:   Sickle cell pain crisis (HCC) Active Problems:   Sickle cell disease with crisis (HCC)   Generalized anxiety disorder   GERD (gastroesophageal reflux disease)   HIV (human immunodeficiency virus infection) (HCC)   Chronic pain syndrome   History of pulmonary embolism   Anxiety   Sickle cell anemia with pain (HCC)   Chronic hypoxic respiratory failure (HCC)   Chronic anticoagulation  Hb Sickle Cell Disease with Pain crisis: Continue IVF 0.45% Saline to KVO, continue weight based Dilaudid  PCA at current dose setting, continue oral home pain medications as ordered. Monitor vitals very closely, Re-evaluate pain scale regularly, 2 L of Oxygen  by [place]. Patient encouraged to ambulate on the hallway today.  Leukocytosis: Resolved. Anemia of Chronic Disease: Hemoglobin is stable at baseline today, there is no clinical indication for blood transfusion at this time.  Will continue to monitor closely and transfuse as appropriate. Chronic pain Syndrome: Continue oral home pain medications as ordered. Asymptomatic HIV infection: Clinically stable.  Will continue home medication. Chronic hypoxic respiratory failure: Patient has now started to saturate well, on 3 L of oxygen .  Continue incentive spirometry.  Ambulate on the hallway. Generalized anxiety disorder: Patient is clinically stable.  He denies any suicidal ideations or thoughts.  Continue home medications. Hyperbilirubinemia: Patient has chronically elevated total bilirubin, 4.4 today, slightly lower than on admission of 6.4.  Will continue to monitor very closely. History of pulmonary embolism: Continue anticoagulation.  Code Status: Full Code Family Communication: N/A Disposition Plan: Not yet ready for  discharge  [name] Cover NP   If 7PM-7AM, please contact night-coverage.  12/20/2023, 11:35 AM  LOS: 3 days

## 2023-12-20 NOTE — Plan of Care (Signed)
  Problem: Education: Goal: Knowledge of vaso-occlusive preventative measures will improve Outcome: Progressing Goal: Awareness of infection prevention will improve Outcome: Progressing Goal: Awareness of signs and symptoms of anemia will improve Outcome: Progressing Goal: Long-term complications will improve Outcome: Progressing   Problem: Self-Care: Goal: Ability to incorporate actions that prevent/reduce pain crisis will improve Outcome: Progressing   Problem: Bowel/Gastric: Goal: Gut motility will be maintained Outcome: Progressing   Problem: Tissue Perfusion: Goal: Complications related to inadequate tissue perfusion will be avoided or minimized Outcome: Progressing   Problem: Respiratory: Goal: Pulmonary complications will be avoided or minimized Outcome: Progressing Goal: Acute Chest Syndrome will be identified early to prevent complications Outcome: Progressing   Problem: Fluid Volume: Goal: Ability to maintain a balanced intake and output will improve Outcome: Progressing   Problem: Sensory: Goal: Pain level will decrease with appropriate interventions Outcome: Progressing   Problem: Health Behavior: Goal: Postive changes in compliance with treatment and prescription regimens will improve Outcome: Progressing   Problem: Education: Goal: Knowledge of General Education information will improve Description: Including pain rating scale, medication(s)/side effects and non-pharmacologic comfort measures Outcome: Progressing   Problem: Health Behavior/Discharge Planning: Goal: Ability to manage health-related needs will improve Outcome: Progressing   Problem: Clinical Measurements: Goal: Ability to maintain clinical measurements within normal limits will improve Outcome: Progressing Goal: Will remain free from infection Outcome: Progressing Goal: Diagnostic test results will improve Outcome: Progressing Goal: Respiratory complications will improve Outcome:  Progressing Goal: Cardiovascular complication will be avoided Outcome: Progressing   Problem: Activity: Goal: Risk for activity intolerance will decrease Outcome: Progressing   Problem: Coping: Goal: Level of anxiety will decrease Outcome: Progressing   Problem: Pain Managment: Goal: General experience of comfort will improve and/or be controlled Outcome: Progressing   Problem: Safety: Goal: Ability to remain free from injury will improve Outcome: Progressing   Problem: Skin Integrity: Goal: Risk for impaired skin integrity will decrease Outcome: Progressing

## 2023-12-21 LAB — CBC
HCT: 19.8 % — ABNORMAL LOW (ref 39.0–52.0)
Hemoglobin: 7.2 g/dL — ABNORMAL LOW (ref 13.0–17.0)
MCH: 37.3 pg — ABNORMAL HIGH (ref 26.0–34.0)
MCHC: 36.4 g/dL — ABNORMAL HIGH (ref 30.0–36.0)
MCV: 102.6 fL — ABNORMAL HIGH (ref 80.0–100.0)
Platelets: 261 K/uL (ref 150–400)
RBC: 1.93 MIL/uL — ABNORMAL LOW (ref 4.22–5.81)
RDW: 25.6 % — ABNORMAL HIGH (ref 11.5–15.5)
WBC: 10 K/uL (ref 4.0–10.5)
nRBC: 4.3 % — ABNORMAL HIGH (ref 0.0–0.2)

## 2023-12-21 MED ORDER — BISACODYL 10 MG RE SUPP
10.0000 mg | Freq: Once | RECTAL | Status: AC
  Administered 2023-12-21: 10 mg via RECTAL
  Filled 2023-12-21: qty 1

## 2023-12-21 NOTE — Plan of Care (Signed)

## 2023-12-21 NOTE — Plan of Care (Signed)
  Problem: Education: Goal: Knowledge of vaso-occlusive preventative measures will improve Outcome: Progressing Goal: Awareness of infection prevention will improve Outcome: Progressing Goal: Awareness of signs and symptoms of anemia will improve Outcome: Progressing Goal: Long-term complications will improve Outcome: Progressing   Problem: Self-Care: Goal: Ability to incorporate actions that prevent/reduce pain crisis will improve Outcome: Progressing   Problem: Bowel/Gastric: Goal: Gut motility will be maintained Outcome: Progressing   Problem: Tissue Perfusion: Goal: Complications related to inadequate tissue perfusion will be avoided or minimized Outcome: Progressing   Problem: Respiratory: Goal: Pulmonary complications will be avoided or minimized Outcome: Progressing Goal: Acute Chest Syndrome will be identified early to prevent complications Outcome: Progressing   Problem: Fluid Volume: Goal: Ability to maintain a balanced intake and output will improve Outcome: Progressing   Problem: Sensory: Goal: Pain level will decrease with appropriate interventions Outcome: Progressing   Problem: Education: Goal: Knowledge of General Education information will improve Description: Including pain rating scale, medication(s)/side effects and non-pharmacologic comfort measures Outcome: Progressing   Problem: Clinical Measurements: Goal: Ability to maintain clinical measurements within normal limits will improve Outcome: Progressing Goal: Will remain free from infection Outcome: Progressing Goal: Diagnostic test results will improve Outcome: Progressing Goal: Respiratory complications will improve Outcome: Progressing Goal: Cardiovascular complication will be avoided Outcome: Progressing   Problem: Activity: Goal: Risk for activity intolerance will decrease Outcome: Progressing   Problem: Nutrition: Goal: Adequate nutrition will be maintained Outcome: Progressing    Problem: Coping: Goal: Level of anxiety will decrease Outcome: Progressing   Problem: Elimination: Goal: Will not experience complications related to bowel motility Outcome: Progressing Goal: Will not experience complications related to urinary retention Outcome: Progressing   Problem: Pain Managment: Goal: General experience of comfort will improve and/or be controlled Outcome: Progressing   Problem: Safety: Goal: Ability to remain free from injury will improve Outcome: Progressing   Problem: Skin Integrity: Goal: Risk for impaired skin integrity will decrease Outcome: Progressing

## 2023-12-21 NOTE — Progress Notes (Addendum)
 Patient ID: Martin [name], male   DOB: 05-29-1990, 33 y.o.   MRN: [id number] Subjective: Martin [name]  is a 33 y.o. male with a medical history significant for sickle cell anemia with history of acute chest syndrome, multiple sickle cell crisis, multiple ED visits and hospital admission, history of PE currently on Eliquis , chronic hypoxic respiratory failure on home oxygen , HIV infection and generalized anxiety disorder who was admitted for sickle cell pain crisis.  Patient endorse pain mostly in his lower back and lower extremities. His pain is now at 6/10 today characterized as throbbing and achy. He denies any fever, cough, chest pain, shortness of breath, nausea, vomiting or diarrhea.  No urinary symptoms.  Objective:  Vital signs in last 24 hours:  Vitals:   12/21/23 0617 12/21/23 0725 12/21/23 1014 12/21/23 1050  BP: 102/73  95/68 104/63  Pulse: 86  (!) 110 (!) 104  Resp: 18 18 15    Temp: 98.2 F (36.8 C)  98.7 F (37.1 C)   TempSrc: Oral  Oral   SpO2: 94% 94% 95% 93%  Weight:      Height:        Intake/Output from previous day:   Intake/Output Summary (Last 24 hours) at 12/21/2023 1101 Last data filed at 12/20/2023 2211 Gross per 24 hour  Intake 13 ml  Output 400 ml  Net -387 ml    Physical Exam: General: Alert, awake, oriented x3, in no acute distress.  HEENT: [place]/AT PEERL, EOMI, Mild icterus + Neck: Trachea midline,  no masses, no thyromegal,y no JVD, no carotid bruit OROPHARYNX:  Moist, No exudate/ erythema/lesions.  Heart: Regular rate and rhythm, without murmurs, rubs, gallops, PMI non-displaced, no heaves or thrills on palpation.  Lungs: Clear to auscultation, no wheezing or rhonchi noted. No increased vocal fremitus resonant to percussion  Abdomen: Soft, nontender, nondistended, positive bowel sounds, no masses no hepatosplenomegaly noted..  Neuro: No focal neurological deficits noted cranial nerves II through XII grossly intact. DTRs 2+ bilaterally upper  and lower extremities. Strength 5 out of 5 in bilateral upper and lower extremities. Musculoskeletal:Lower back / lower extremity tenderness Psychiatric: Patient alert and oriented x3, good insight and cognition, good recent to remote recall. Lymph node survey: No cervical axillary or inguinal lymphadenopathy noted.  Lab Results:  Basic Metabolic Panel:    Component Value Date/Time   NA 140 12/20/2023 0541   NA 140 01/12/2023 1607   K 4.3 12/20/2023 0541   CL 105 12/20/2023 0541   CO2 23 12/20/2023 0541   BUN 11 12/20/2023 0541   BUN 8 01/12/2023 1607   CREATININE 0.88 12/20/2023 0541   GLUCOSE 93 12/20/2023 0541   CALCIUM  9.0 12/20/2023 0541   CBC:    Component Value Date/Time   WBC 10.0 12/21/2023 0500   HGB 7.2 (L) 12/21/2023 0500   HGB 7.4 (L) 01/12/2023 1607   HCT 19.8 (L) 12/21/2023 0500   HCT 21.7 (L) 01/12/2023 1607   PLT 261 12/21/2023 0500   PLT 321 01/12/2023 1607   MCV 102.6 (H) 12/21/2023 0500   MCV 94 01/12/2023 1607   NEUTROABS 8.9 (H) 12/17/2023 0212   NEUTROABS 3.8 01/12/2023 1607   LYMPHSABS 3.8 12/17/2023 0212   LYMPHSABS 5.6 (H) 01/12/2023 1607   MONOABS 1.3 (H) 12/17/2023 0212   EOSABS 0.1 12/17/2023 0212   EOSABS 0.2 01/12/2023 1607   BASOSABS 0.1 12/17/2023 0212   BASOSABS 0.1 01/12/2023 1607    No results found for this or any previous visit (from the  past 240 hours).  Studies/Results: No results found.  Medications: Scheduled Meds:  abacavir -dolutegravir -lamiVUDine   1 tablet Oral Daily   apixaban   5 mg Oral BID   bisacodyl   10 mg Rectal Once   Chlorhexidine  Gluconate Cloth  6 each Topical Daily   folic acid   500 mcg Oral Daily   HYDROmorphone    Intravenous Q4H   hydroxyurea   2,000 mg Oral QHS   senna-docusate  2 tablet Oral QHS   sodium chloride  flush  10-40 mL Intracatheter Q12H   sodium chloride  flush  3 mL Intravenous Q12H   Continuous Infusions: PRN Meds:.acetaminophen  **OR** acetaminophen , albuterol , benzocaine ,  cyclobenzaprine , gabapentin , lip balm, naloxone  **AND** sodium chloride  flush, ondansetron  **OR** ondansetron  (ZOFRAN ) IV, oxyCODONE -acetaminophen  **AND** oxyCODONE , pantoprazole , sodium chloride  flush  Consultants: None  Procedures: None  Antibiotics: None  Assessment/Plan: Principal Problem:   Sickle cell pain crisis (HCC) Active Problems:   Sickle cell disease with crisis (HCC)   Generalized anxiety disorder   GERD (gastroesophageal reflux disease)   HIV (human immunodeficiency virus infection) (HCC)   Chronic pain syndrome   History of pulmonary embolism   Anxiety   Sickle cell anemia with pain (HCC)   Chronic hypoxic respiratory failure (HCC)   Chronic anticoagulation  Hb Sickle Cell Disease with Pain crisis: Continue IVF 0.45% Saline to KVO, continue weight based Dilaudid  PCA at current dose setting, continue oral home pain medications as ordered. Monitor vitals very closely, Re-evaluate pain scale regularly, 2 L of Oxygen  by [place]. Patient encouraged to ambulate on the hallway today.  Leukocytosis: Resolved. Anemia of Chronic Disease: Hemoglobin is stable at baseline today, there is no clinical indication for blood transfusion at this time.  Will continue to monitor closely and transfuse as appropriate. Chronic pain Syndrome: Continue oral home pain medications as ordered. Asymptomatic HIV infection: Clinically stable.  Will continue home medication. Chronic hypoxic respiratory failure: Patient has now started to saturate well, on 3 L of oxygen .  Continue incentive spirometry.  Ambulate on the hallway. Generalized anxiety disorder: Patient is clinically stable.  He denies any suicidal ideations or thoughts.  Continue home medications. Hyperbilirubinemia: Patient has chronically elevated total bilirubin, 4.4 today, slightly lower than on admission of 6.4. Will continue to monitor very closely. History of pulmonary embolism: Continue anticoagulation.  Code Status: Full  Code Family Communication: N/A Disposition Plan: Not yet ready for discharge  [name] Cover NP   If 7PM-7AM, please contact night-coverage.  12/21/2023, 11:01 AM  LOS: 4 days

## 2023-12-22 LAB — CBC
HCT: 18.7 % — ABNORMAL LOW (ref 39.0–52.0)
Hemoglobin: 6.8 g/dL — CL (ref 13.0–17.0)
MCH: 37 pg — ABNORMAL HIGH (ref 26.0–34.0)
MCHC: 36.4 g/dL — ABNORMAL HIGH (ref 30.0–36.0)
MCV: 101.6 fL — ABNORMAL HIGH (ref 80.0–100.0)
Platelets: 246 K/uL (ref 150–400)
RBC: 1.84 MIL/uL — ABNORMAL LOW (ref 4.22–5.81)
RDW: 26.1 % — ABNORMAL HIGH (ref 11.5–15.5)
WBC: 8.6 K/uL (ref 4.0–10.5)
nRBC: 10.1 % — ABNORMAL HIGH (ref 0.0–0.2)

## 2023-12-22 LAB — PREPARE RBC (CROSSMATCH)

## 2023-12-22 MED ORDER — SODIUM CHLORIDE 0.9% IV SOLUTION: Freq: Once | INTRAVENOUS | Status: AC

## 2023-12-22 NOTE — Progress Notes (Signed)
 Patient ID: Martin [name], male   DOB: 04/30/1990, 33 y.o.   MRN: [id number] Subjective: Martin [name]  is a 33 y.o. male with a medical history significant for sickle cell anemia with history of acute chest syndrome, multiple sickle cell crisis, multiple ED visits and hospital admission, history of PE currently on Eliquis , chronic hypoxic respiratory failure on home oxygen , HIV infection and generalized anxiety disorder who was admitted for sickle cell pain crisis.  Patient endorse pain mostly in his lower back and lower extremities. His pain is now at 5/10 today. He denies any fever, cough, chest pain, shortness of breath, nausea, vomiting or diarrhea.  No urinary symptoms.  Objective:  Vital signs in last 24 hours:  Vitals:   12/22/23 0934 12/22/23 1035 12/22/23 1044 12/22/23 1157  BP:   100/76   Pulse:   92   Resp: 16 19 15 16   Temp:   98 F (36.7 C)   TempSrc:   Oral   SpO2:  95% 94% 94%  Weight:      Height:        Intake/Output from previous day:   Intake/Output Summary (Last 24 hours) at 12/22/2023 1220 Last data filed at 12/22/2023 0935 Gross per 24 hour  Intake 20 ml  Output 2050 ml  Net -2030 ml    Physical Exam: General: Alert, awake, oriented x3, in no acute distress.  HEENT: [place]/AT PEERL, EOMI, Mild icterus + Neck: Trachea midline,  no masses, no thyromegal,y no JVD, no carotid bruit OROPHARYNX:  Moist, No exudate/ erythema/lesions.  Heart: Regular rate and rhythm, without murmurs, rubs, gallops, PMI non-displaced, no heaves or thrills on palpation.  Lungs: Clear to auscultation, no wheezing or rhonchi noted. No increased vocal fremitus resonant to percussion  Abdomen: Soft, nontender, nondistended, positive bowel sounds, no masses no hepatosplenomegaly noted..  Neuro: No focal neurological deficits noted cranial nerves II through XII grossly intact. DTRs 2+ bilaterally upper and lower extremities. Strength 5 out of 5 in bilateral upper and lower  extremities. Musculoskeletal:Lower back / lower extremity tenderness Psychiatric: Patient alert and oriented x3, good insight and cognition, good recent to remote recall. Lymph node survey: No cervical axillary or inguinal lymphadenopathy noted.  Lab Results:  Basic Metabolic Panel:    Component Value Date/Time   NA 140 12/20/2023 0541   NA 140 01/12/2023 1607   K 4.3 12/20/2023 0541   CL 105 12/20/2023 0541   CO2 23 12/20/2023 0541   BUN 11 12/20/2023 0541   BUN 8 01/12/2023 1607   CREATININE 0.88 12/20/2023 0541   GLUCOSE 93 12/20/2023 0541   CALCIUM  9.0 12/20/2023 0541   CBC:    Component Value Date/Time   WBC 8.6 12/22/2023 0500   HGB 6.8 (LL) 12/22/2023 0500   HGB 7.4 (L) 01/12/2023 1607   HCT 18.7 (L) 12/22/2023 0500   HCT 21.7 (L) 01/12/2023 1607   PLT 246 12/22/2023 0500   PLT 321 01/12/2023 1607   MCV 101.6 (H) 12/22/2023 0500   MCV 94 01/12/2023 1607   NEUTROABS 8.9 (H) 12/17/2023 0212   NEUTROABS 3.8 01/12/2023 1607   LYMPHSABS 3.8 12/17/2023 0212   LYMPHSABS 5.6 (H) 01/12/2023 1607   MONOABS 1.3 (H) 12/17/2023 0212   EOSABS 0.1 12/17/2023 0212   EOSABS 0.2 01/12/2023 1607   BASOSABS 0.1 12/17/2023 0212   BASOSABS 0.1 01/12/2023 1607    No results found for this or any previous visit (from the past 240 hours).  Studies/Results: No results found.  Medications:  Scheduled Meds:  sodium chloride    Intravenous Once   abacavir -dolutegravir -lamiVUDine   1 tablet Oral Daily   apixaban   5 mg Oral BID   Chlorhexidine  Gluconate Cloth  6 each Topical Daily   folic acid   500 mcg Oral Daily   HYDROmorphone    Intravenous Q4H   hydroxyurea   2,000 mg Oral QHS   senna-docusate  2 tablet Oral QHS   sodium chloride  flush  10-40 mL Intracatheter Q12H   sodium chloride  flush  3 mL Intravenous Q12H   Continuous Infusions: PRN Meds:.acetaminophen  **OR** acetaminophen , albuterol , benzocaine , cyclobenzaprine , gabapentin , lip balm, naloxone  **AND** sodium chloride  flush,  ondansetron  **OR** ondansetron  (ZOFRAN ) IV, oxyCODONE -acetaminophen  **AND** oxyCODONE , pantoprazole , sodium chloride  flush  Consultants: None  Procedures: Blood transfusion   Antibiotics: None  Assessment/Plan: Principal Problem:   Sickle cell pain crisis (HCC) Active Problems:   Sickle cell disease with crisis (HCC)   Generalized anxiety disorder   GERD (gastroesophageal reflux disease)   HIV (human immunodeficiency virus infection) (HCC)   Chronic pain syndrome   History of pulmonary embolism   Anxiety   Sickle cell anemia with pain (HCC)   Chronic hypoxic respiratory failure (HCC)   Chronic anticoagulation  Hb Sickle Cell Disease with Pain crisis: Continue IVF 0.45% Saline to KVO, continue weight based Dilaudid  PCA at current dose setting, continue oral home pain medications as ordered. Monitor vitals very closely, Re-evaluate pain scale regularly, 2 L of Oxygen  by [place]. Patient encouraged to ambulate on the hallway today.  Leukocytosis: Resolved. Anemia of Chronic Disease: Hemoglobin is lower than baseline today at 6.8, will transfuse 1 unit PRBC and continue to monitor daily CBC Chronic pain Syndrome: Continue oral home pain medications as ordered. Asymptomatic HIV infection: Clinically stable.  Will continue home medication. Chronic hypoxic respiratory failure: Patient has now started to saturate well, on 3 L of oxygen .  Continue incentive spirometry.  Ambulate on the hallway. Generalized anxiety disorder: Patient is clinically stable.  He denies any suicidal ideations or thoughts.  Continue home medications. Hyperbilirubinemia: Patient has chronically elevated total bilirubin, 4.4 today, slightly lower than on admission of 6.4. Will continue to monitor very closely. History of pulmonary embolism: Continue anticoagulation.  Code Status: Full Code Family Communication: N/A Disposition Plan: Not yet ready for discharge  [name] Cover NP   If 7PM-7AM, please contact  night-coverage.  12/22/2023, 12:20 PM  LOS: 5 days

## 2023-12-22 NOTE — Plan of Care (Signed)
  Problem: Self-Care: Goal: Ability to incorporate actions that prevent/reduce pain crisis will improve Outcome: Progressing   Problem: Respiratory: Goal: Pulmonary complications will be avoided or minimized Outcome: Progressing Goal: Acute Chest Syndrome will be identified early to prevent complications Outcome: Progressing   Problem: Fluid Volume: Goal: Ability to maintain a balanced intake and output will improve Outcome: Progressing   Problem: Sensory: Goal: Pain level will decrease with appropriate interventions Outcome: Progressing   Problem: Health Behavior: Goal: Postive changes in compliance with treatment and prescription regimens will improve Outcome: Progressing   Problem: Safety: Goal: Ability to remain free from injury will improve Outcome: Progressing

## 2023-12-22 NOTE — Plan of Care (Signed)
  Problem: Education: Goal: Knowledge of vaso-occlusive preventative measures will improve Outcome: Progressing   Problem: Self-Care: Goal: Ability to incorporate actions that prevent/reduce pain crisis will improve Outcome: Progressing   Problem: Respiratory: Goal: Pulmonary complications will be avoided or minimized Outcome: Progressing   Problem: Sensory: Goal: Pain level will decrease with appropriate interventions Outcome: Progressing   Problem: Health Behavior: Goal: Postive changes in compliance with treatment and prescription regimens will improve Outcome: Progressing   Problem: Education: Goal: Knowledge of General Education information will improve Description: Including pain rating scale, medication(s)/side effects and non-pharmacologic comfort measures Outcome: Progressing

## 2023-12-22 NOTE — TOC Progression Note (Signed)
 Transition of Care [facility]) - Progression Note    Patient Details  Name: Martin [name] MRN: [id number] Date of Birth: March 22, 1991  [facility] CM/SW Contact  Heather DELENA Saltness, LCSW Phone Number: 12/22/2023, 4:14 PM  Clinical Narrative:    CSW met with pt at bedside to discuss discharge planning. Pt reports interest in being involved with the [employer]. Pt reports he was involved in the program in [place] , but has since moved to [facility]. CSW advised pt of [facility] for Infectious Disease Treatment, Prevention, and Research has the [facility] contact information and additional [employer] [facility] added to AVS.      Barriers to Discharge: Continued Medical Work up, Inadequate or no insurance   Expected Discharge Plan and Services  Home      Social Drivers of Health (SDOH) Interventions SDOH Screenings   Food Insecurity: No Food Insecurity (12/17/2023)  Housing: High Risk (12/17/2023)  Transportation Needs: No Transportation Needs (12/17/2023)  Recent Concern: Transportation Needs - Unmet Transportation Needs (10/12/2023)  Utilities: Not At Risk (12/17/2023)  Recent Concern: Utilities - At Risk (10/12/2023)  Depression (PHQ2-9): Low Risk  (11/03/2023)  Financial Resource Strain: Low Risk  (10/09/2023)   Received from [facility]  Recent Concern: Financial Resource Strain - Medium Risk (08/03/2023)  Physical Activity: Sufficiently Active (10/09/2023)   Received from [facility]  Social Connections: Socially Isolated (10/09/2023)   Received from [facility]  Stress: No Stress Concern Present (10/09/2023)   Received from [facility]  Tobacco Use: Low Risk  (12/17/2023)    Readmission Risk Interventions    12/22/2023    4:14 PM 11/10/2023   10:19 AM 10/13/2023    1:11 PM  Readmission Risk Prevention Plan  Transportation Screening Complete Complete Complete  Medication Review (RN Care Manager) Complete Referral  to Pharmacy Complete  PCP or Specialist appointment within 3-5 days of discharge Complete Complete Complete  HRI or Home Care Consult Complete Complete   SW Recovery Care/Counseling Consult Complete Complete Complete  Palliative Care Screening Not Applicable Not Applicable Not Applicable  Skilled Nursing Facility Not Applicable Not Applicable Not Applicable    Signed: Heather Saltness, MSW, LCSW Clinical Social Worker Inpatient Care Management 12/22/2023 4:23 PM

## 2023-12-23 LAB — TYPE AND SCREEN
ABO/RH(D): O POS
Antibody Screen: NEGATIVE
Donor AG Type: NEGATIVE
Unit division: 0

## 2023-12-23 LAB — CBC
HCT: 23.1 % — ABNORMAL LOW (ref 39.0–52.0)
Hemoglobin: 8.4 g/dL — ABNORMAL LOW (ref 13.0–17.0)
MCH: 36.4 pg — ABNORMAL HIGH (ref 26.0–34.0)
MCHC: 36.4 g/dL — ABNORMAL HIGH (ref 30.0–36.0)
MCV: 100 fL (ref 80.0–100.0)
Platelets: 291 K/uL (ref 150–400)
RBC: 2.31 MIL/uL — ABNORMAL LOW (ref 4.22–5.81)
RDW: 25 % — ABNORMAL HIGH (ref 11.5–15.5)
WBC: 10.8 K/uL — ABNORMAL HIGH (ref 4.0–10.5)
nRBC: 11.7 % — ABNORMAL HIGH (ref 0.0–0.2)

## 2023-12-23 LAB — BPAM RBC
Blood Product Expiration Date: 202509172359
ISSUE DATE / TIME: 202509031255
Unit Type and Rh: 5100

## 2023-12-23 MED ORDER — HYDROMORPHONE HCL 2 MG/ML IJ SOLN
2.0000 mg | Freq: Once | INTRAMUSCULAR | Status: AC
  Administered 2023-12-23: 2 mg via INTRAVENOUS
  Filled 2023-12-23: qty 1

## 2023-12-23 MED ORDER — GUAIFENESIN 200 MG PO TABS: 200.0000 mg | ORAL_TABLET | ORAL | Status: DC | PRN

## 2023-12-23 NOTE — Progress Notes (Signed)
   12/23/23 1240  Spiritual Encounters  Type of Visit Initial  Care provided to: Patient  Referral source Chaplain team  Reason for visit Routine spiritual support  OnCall Visit No   Chaplain provided support to patient, Martin [name] in the form of listening as he shared his story. Martin [name] recently moved to [facility] and is still getting acclimated to our area. Martin [name]  is hopeful he will be able to leave soon but knows that could change. We prayed together for his strength,healing and his family's well being.   Carley [place] [facility]  [phone]

## 2023-12-23 NOTE — Plan of Care (Signed)
  Problem: Education: Goal: Knowledge of vaso-occlusive preventative measures will improve Outcome: Progressing Goal: Awareness of infection prevention will improve Outcome: Progressing   Problem: Tissue Perfusion: Goal: Complications related to inadequate tissue perfusion will be avoided or minimized Outcome: Progressing   Problem: Fluid Volume: Goal: Ability to maintain a balanced intake and output will improve Outcome: Progressing   Problem: Sensory: Goal: Pain level will decrease with appropriate interventions Outcome: Progressing   Problem: Education: Goal: Knowledge of General Education information will improve Description: Including pain rating scale, medication(s)/side effects and non-pharmacologic comfort measures Outcome: Progressing   Problem: Health Behavior/Discharge Planning: Goal: Ability to manage health-related needs will improve Outcome: Progressing   Problem: Activity: Goal: Risk for activity intolerance will decrease Outcome: Progressing   Problem: Nutrition: Goal: Adequate nutrition will be maintained Outcome: Progressing   Problem: Coping: Goal: Level of anxiety will decrease Outcome: Progressing   Problem: Safety: Goal: Ability to remain free from injury will improve Outcome: Progressing   Problem: Skin Integrity: Goal: Risk for impaired skin integrity will decrease Outcome: Progressing

## 2023-12-23 NOTE — Progress Notes (Addendum)
 Patient ID: Martin [name], male   DOB: [birth year], 33 y.o.   MRN: [id number] Subjective: Martin [name]  is a 33 y.o. male with a medical history significant for sickle cell anemia with history of acute chest syndrome, multiple sickle cell crisis, multiple ED visits and hospital admission, history of PE currently on Eliquis , chronic hypoxic respiratory failure on home oxygen , HIV infection and generalized anxiety disorder who was admitted for sickle cell pain crisis.  Patient endorse pain mostly in his lower back and lower extremities. His pain is back up at  9/10 today. He denies any fever, cough, chest pain, shortness of breath, nausea, vomiting or diarrhea.  No urinary symptoms.  Objective:  Vital signs in last 24 hours:  Vitals:   12/23/23 0353 12/23/23 0742 12/23/23 1108 12/23/23 1138  BP: 106/80  112/86   Pulse: (!) 107  (!) 121   Resp: 18 14  16   Temp: (!) 97.4 F (36.3 C)  98.6 F (37 C)   TempSrc: Oral  Oral   SpO2: 91% 90% 97% 95%  Weight:      Height:        Intake/Output from previous day:   Intake/Output Summary (Last 24 hours) at 12/23/2023 1413 Last data filed at 12/23/2023 1030 Gross per 24 hour  Intake 440.17 ml  Output 1850 ml  Net -1409.83 ml    Physical Exam: General: Alert, awake, oriented x3, in no acute distress.  HEENT: [place]/AT PEERL, EOMI, Mild icterus + Neck: Trachea midline,  no masses, no thyromegal,y no JVD, no carotid bruit OROPHARYNX:  Moist, No exudate/ erythema/lesions.  Heart: Regular rate and rhythm, without murmurs, rubs, gallops, PMI non-displaced, no heaves or thrills on palpation.  Lungs: Clear to auscultation, no wheezing or rhonchi noted. No increased vocal fremitus resonant to percussion  Abdomen: Soft, nontender, nondistended, positive bowel sounds, no masses no hepatosplenomegaly noted..  Neuro: No focal neurological deficits noted cranial nerves II through XII grossly intact. DTRs 2+ bilaterally upper and lower extremities.  Strength 5 out of 5 in bilateral upper and lower extremities. Musculoskeletal:Lower back / lower extremity tenderness Psychiatric: Patient alert and oriented x3, good insight and cognition, good recent to remote recall. Lymph node survey: No cervical axillary or inguinal lymphadenopathy noted.  Lab Results:  Basic Metabolic Panel:    Component Value Date/Time   NA 140 12/20/2023 0541   NA 140 01/12/2023 1607   K 4.3 12/20/2023 0541   CL 105 12/20/2023 0541   CO2 23 12/20/2023 0541   BUN 11 12/20/2023 0541   BUN 8 01/12/2023 1607   CREATININE 0.88 12/20/2023 0541   GLUCOSE 93 12/20/2023 0541   CALCIUM  9.0 12/20/2023 0541   CBC:    Component Value Date/Time   WBC 10.8 (H) 12/23/2023 0402   HGB 8.4 (L) 12/23/2023 0402   HGB 7.4 (L) 01/12/2023 1607   HCT 23.1 (L) 12/23/2023 0402   HCT 21.7 (L) 01/12/2023 1607   PLT 291 12/23/2023 0402   PLT 321 01/12/2023 1607   MCV 100.0 12/23/2023 0402   MCV 94 01/12/2023 1607   NEUTROABS 8.9 (H) 12/17/2023 0212   NEUTROABS 3.8 01/12/2023 1607   LYMPHSABS 3.8 12/17/2023 0212   LYMPHSABS 5.6 (H) 01/12/2023 1607   MONOABS 1.3 (H) 12/17/2023 0212   EOSABS 0.1 12/17/2023 0212   EOSABS 0.2 01/12/2023 1607   BASOSABS 0.1 12/17/2023 0212   BASOSABS 0.1 01/12/2023 1607    No results found for this or any previous visit (from the past 240  hours).  Studies/Results: No results found.  Medications: Scheduled Meds:  abacavir -dolutegravir -lamiVUDine   1 tablet Oral Daily   apixaban   5 mg Oral BID   Chlorhexidine  Gluconate Cloth  6 each Topical Daily   folic acid   500 mcg Oral Daily   HYDROmorphone    Intravenous Q4H   hydroxyurea   2,000 mg Oral QHS   senna-docusate  2 tablet Oral QHS   sodium chloride  flush  10-40 mL Intracatheter Q12H   sodium chloride  flush  3 mL Intravenous Q12H   Continuous Infusions: PRN Meds:.acetaminophen  **OR** acetaminophen , albuterol , benzocaine , cyclobenzaprine , gabapentin , guaiFENesin , lip balm, naloxone   **AND** sodium chloride  flush, ondansetron  **OR** ondansetron  (ZOFRAN ) IV, oxyCODONE -acetaminophen  **AND** oxyCODONE , pantoprazole , sodium chloride  flush  Consultants: None  Procedures: Blood transfusion   Antibiotics: None  Assessment/Plan: Principal Problem:   Sickle cell pain crisis (HCC) Active Problems:   Sickle cell disease with crisis (HCC)   Generalized anxiety disorder   GERD (gastroesophageal reflux disease)   HIV (human immunodeficiency virus infection) (HCC)   Chronic pain syndrome   History of pulmonary embolism   Anxiety   Sickle cell anemia with pain (HCC)   Chronic hypoxic respiratory failure (HCC)   Chronic anticoagulation  Hb Sickle Cell Disease with Pain crisis: pain is worse today, one time dilaudid  2 mg IV push for pain control, Continue IVF 0.45% Saline to KVO, continue weight based Dilaudid  PCA at current dose setting, continue oral home pain medications as ordered. Monitor vitals very closely, Re-evaluate pain scale regularly, 2 L of Oxygen  by [place]. Patient encouraged to ambulate on the hallway today.  Leukocytosis: slightly elevated most likely due to active crisis, no s/s of acute infection, will monitor without antibiotics. Monitor daily cbc  Anemia of Chronic Disease: Hemoglobin is lower than baseline today at 6.8, will transfuse 1 unit PRBC and continue to monitor daily CBC Chronic pain Syndrome: Continue oral home pain medications as ordered. Asymptomatic HIV infection: Clinically stable.  Will continue home medication. Chronic hypoxic respiratory failure: Patient has now started to saturate well, on 3 L of oxygen .  Continue incentive spirometry.  Ambulate on the hallway. Generalized anxiety disorder: Patient is clinically stable.  He denies any suicidal ideations or thoughts.  Continue home medications. Hyperbilirubinemia: Patient has chronically elevated total bilirubin, 6.0. Will continue to monitor very closely. History of pulmonary embolism: Continue  anticoagulation.  Code Status: Full Code Family Communication: N/A Disposition Plan: Not yet ready for discharge  [name] Cover NP   If 7PM-7AM, please contact night-coverage.  12/23/2023, 2:13 PM  LOS: 6 days

## 2023-12-23 NOTE — Plan of Care (Signed)
  Problem: Self-Care: Goal: Ability to incorporate actions that prevent/reduce pain crisis will improve Outcome: Progressing   Problem: Fluid Volume: Goal: Ability to maintain a balanced intake and output will improve Outcome: Progressing   Problem: Sensory: Goal: Pain level will decrease with appropriate interventions Outcome: Progressing   Problem: Health Behavior: Goal: Postive changes in compliance with treatment and prescription regimens will improve Outcome: Progressing   Problem: Clinical Measurements: Goal: Ability to maintain clinical measurements within normal limits will improve Outcome: Progressing   Problem: Pain Managment: Goal: General experience of comfort will improve and/or be controlled Outcome: Progressing   Problem: Safety: Goal: Ability to remain free from injury will improve Outcome: Progressing

## 2023-12-24 MED ORDER — HEPARIN SOD (PORK) LOCK FLUSH 100 UNIT/ML IV SOLN
500.0000 [IU] | INTRAVENOUS | Status: AC | PRN
  Administered 2023-12-24: 500 [IU]

## 2023-12-24 NOTE — Discharge Summary (Signed)
 Physician Discharge Summary  Martin [name] FMW:[id number] DOB: [birth year] DOA: 12/17/2023  PCP: [name], [name], [name]  Admit date: 12/17/2023  Discharge date: 12/24/2023  Discharge Diagnoses:  Principal Problem:   Sickle cell pain crisis (HCC) Active Problems:   Sickle cell disease with crisis (HCC)   Generalized anxiety disorder   GERD (gastroesophageal reflux disease)   HIV (human immunodeficiency virus infection) (HCC)   Chronic pain syndrome   History of pulmonary embolism   Anxiety   Sickle cell anemia with pain (HCC)   Chronic hypoxic respiratory failure (HCC)   Chronic anticoagulation   Discharge Condition: Stable  Disposition:   Follow-up Information     [facility]             . Call on 12/26/2023.   Why: [facility] receives funding from [employer]. To find out more about what services are covered at our clinic or if you qualify, please contact our office at  [phone]. Contact information: [address] [place]  [address]               Pt is discharged home in good condition and is to follow up with [name], [name], [name] this week to have labs evaluated. [name] is instructed to increase activity slowly and balance with rest for the next few days, and use prescribed medication to complete treatment of pain  Diet: Regular Wt Readings from Last 3 Encounters:  12/18/23 67.2 kg  12/15/23 63.5 kg  12/11/23 63.5 kg    History of present illness:  Martin [name]  is a 33 y.o. male with a medical history significant for sickle cell anemia with history of acute chest syndrome, multiple sickle cell crisis, multiple ED visits and hospital admission, history of PE currently on Eliquis , chronic hypoxic respiratory failure on home oxygen , HIV infection and generalized anxiety disorder who presented to the emergency room   with major complaints of generalized body pain that is consistent with his typical sickle cell pain crisis.  Patient was recently admitted for the same complaints and discharged on 10/29/2023, he has since been to the ED again between then and this visit.  Patient claims his pain is mostly in his hip joints, lower back and lower extremities.  At present, patient has no shortness of breath, no cough or chest pain.  He endorses adherence with his home medications including Eliquis .  He reports good oral intake.  He denies any fever, headache, dizziness, nausea, vomiting or diarrhea.  No recent travels, and no sick contacts.  ED Course: Patient was treated in the emergency department with IV fluids, IV pain medication with no resolution to pain symptoms.  He is admitted inpatient for ongoing sickle cell pain management and anemia. BP (!) 105/94  Pulse (!) 106  Temp 99.2 F (37.3 C)  Resp 16  SpO2 (!) 78%   Hospital Course:  Patient was admitted for sickle cell pain crisis and managed appropriately with IVF, IV Dilaudid  via PCA and IV Toradol , as well as other adjunct therapies per sickle cell pain management protocols.  Patient's hemoglobin dropped to 6.8 while on admission.  He received 1 unit packed red blood cells blood transfusion.  Patient tolerated transfusion without transfusion reactions.  Hemoglobin improved to 8.4.  He is reporting significant improvement to pain today. He is tolerating p.o. without nausea or vomiting. Patient is ambulating  without assistance. Patient was therefore discharged home today in a hemodynamically stable condition.   Rashard will follow-up with PCP within 1 week of this discharge. [name] was counseled extensively about nonpharmacologic means of pain management, patient verbalized understanding and was appreciative of  the care received during this admission.   We discussed the need for good hydration, monitoring of hydration status, avoidance of heat, cold,  stress, and infection triggers. We discussed the need to be adherent with taking Hydrea  and other home medications. Patient was reminded of the need to seek medical attention immediately if any symptom of bleeding, anemia, or infection occurs.  Discharge Exam: Vitals:   12/24/23 1255 12/24/23 1429  BP:  105/65  Pulse:  (!) 106  Resp: 16 15  Temp:  98.1 F (36.7 C)  SpO2: 96% 91%   Vitals:   12/24/23 1005 12/24/23 1206 12/24/23 1255 12/24/23 1429  BP: 104/66   105/65  Pulse: 85   (!) 106  Resp: 14 16 16 15   Temp: 98 F (36.7 C)   98.1 F (36.7 C)  TempSrc: Oral   Oral  SpO2: 97% 96% 96% 91%  Weight:      Height:        General appearance : Awake, alert, not in any distress. Speech Clear. Not toxic looking HEENT: Atraumatic and Normocephalic, pupils equally reactive to light and accomodation Neck: Supple, no JVD. No cervical lymphadenopathy.  Chest: Good air entry bilaterally, no added sounds  CVS: S1 S2 regular, no murmurs.  Abdomen: Bowel sounds present, Non tender and not distended with no gaurding, rigidity or rebound. Extremities: B/L Lower Ext shows no edema, both legs are warm to touch Neurology: Awake alert, and oriented X 3, CN II-XII intact, Non focal Skin: No Rash  Discharge Instructions  Discharge Instructions     Call MD for:  severe uncontrolled pain   Complete by: As directed    Call MD for:  temperature >100.4   Complete by: As directed    Diet - low sodium heart healthy   Complete by: As directed    Increase activity slowly   Complete by: As directed       Allergies as of 12/24/2023       Reactions   Ketorolac  Tromethamine  Swelling, Other (See Comments)   Patient reports facial edema and left arm edema after administration.   Tape Rash, Other (See Comments)   PLEASE DO NOT USE THE CLEAR, THICK, PLASTIC TAPE- only paper tape is tolerated    Wound Dressing Adhesive Rash        Medication List     TAKE these medications    albuterol  108  (90 Base) MCG/ACT inhaler Commonly known as: VENTOLIN  HFA Inhale 2 puffs into the lungs every 6 (six) hours as needed for wheezing or shortness of breath.   cyclobenzaprine  10 MG tablet Commonly known as: FLEXERIL  Take 10 mg by mouth 2 (two) times daily as needed for muscle spasms.   Deferasirox  360 MG Tabs Commonly known as: Jadenu  Take 2 tablets (720 mg total) by mouth daily.   diphenhydrAMINE  25 MG tablet Commonly known as: BENADRYL  Take 1 tablet (25 mg total) by mouth every 4 (four) hours as needed for itching. What changed:  how much to take when to take this   Eliquis  5 MG Tabs tablet Generic drug: apixaban  Take 1 tablet (5 mg total) by mouth 2 (two) times daily.   folic acid  400 MCG tablet Commonly known as: FOLVITE  Take 1 tablet (  400 mcg total) by mouth daily.   gabapentin  300 MG capsule Commonly known as: NEURONTIN  Take 1 capsule (300 mg total) by mouth at bedtime as needed (nerve pain). What changed: when to take this   guaiFENesin  600 MG 12 hr tablet Commonly known as: Mucinex  Take 1 tablet (600 mg total) by mouth 2 (two) times daily.   hydroxyurea  500 MG capsule Commonly known as: HYDREA  Take 4 capsules (2,000 mg total) by mouth at bedtime.   Narcan  4 MG/0.1ML Liqd nasal spray kit Generic drug: naloxone  Place 1 spray into the nose as needed (respiratory distress / opioid overdose).   oxyCODONE -acetaminophen  10-325 MG tablet Commonly known as: PERCOCET Take 1 tablet by mouth every 4 (four) hours as needed for pain.   pantoprazole  40 MG tablet Commonly known as: PROTONIX  Take 1 tablet (40 mg total) by mouth daily as needed (acid reflux).   senna-docusate 8.6-50 MG tablet Commonly known as: Senokot-S Take 2 tablets by mouth at bedtime.   triamcinolone  cream 0.1 % Commonly known as: KENALOG  Apply 1 Application topically 2 (two) times daily.   Triumeq  600-50-300 MG tablet Generic drug: abacavir -dolutegravir -lamiVUDine  Take 1 tablet by mouth  daily.   Vitamin D  (Ergocalciferol ) 1.25 MG (50000 UNIT) Caps capsule Commonly known as: DRISDOL  Take 1 capsule (50,000 Units total) by mouth every 7 (seven) days.   Vitamin D  (Ergocalciferol ) 1.25 MG (50000 UNIT) Caps capsule Commonly known as: DRISDOL  Take 1 capsule (50,000 Units total) by mouth every 7 (seven) days.        The results of significant diagnostics from this hospitalization (including imaging, microbiology, ancillary and laboratory) are listed below for reference.    Significant Diagnostic Studies: DG Chest 2 View Result Date: 12/15/2023 CLINICAL DATA:  Sickle cell crisis, dyspnea. EXAM: CHEST - 2 VIEW COMPARISON:  December 11, 2023 FINDINGS: There is stable right-sided venous Port-A-Cath positioning. The heart size and mediastinal contours are within normal limits. Mild, diffuse, chronic appearing increased interstitial lung markings are seen. There is no evidence of focal consolidation, pleural effusion or pneumothorax. No acute osseous abnormality is identified. IMPRESSION: Predominantly stable chronic appearing increased interstitial lung markings. Electronically Signed   By: Suzen Dials M.D.   On: 12/15/2023 01:56   DG Chest Port 1 View Result Date: 12/11/2023 CLINICAL DATA:  Sickle cell crisis with chest pain, initial encounter EXAM: PORTABLE CHEST 1 VIEW COMPARISON:  11/07/2023 FINDINGS: Cardiac shadow is stable. Right chest wall port is noted in satisfactory position. Mild interstitial changes are seen bilaterally similar to that noted on the prior exam. No focal infiltrate or effusion is noted. No acute bony abnormality is seen. IMPRESSION: Stable interstitial changes bilaterally. No acute abnormality noted. Electronically Signed   By: Oneil Devonshire M.D.   On: 12/11/2023 02:44    Microbiology: No results found for this or any previous visit (from the past 240 hours).   Labs: Basic Metabolic Panel: Recent Labs  Lab 12/18/23 0332 12/20/23 0541  NA 139 140   K 4.2 4.3  CL 107 105  CO2 23 23  GLUCOSE 85 93  BUN 8 11  CREATININE 0.75 0.88  CALCIUM  8.3* 9.0   Liver Function Tests: Recent Labs  Lab 12/18/23 0332 12/20/23 0541  AST 22 38  ALT 13 13  ALKPHOS 57 59  BILITOT 4.4* 6.0*  PROT 6.9 7.6  ALBUMIN 3.8 4.1   No results for input(s): LIPASE, AMYLASE in the last 168 hours. No results for input(s): AMMONIA in the last 168 hours. CBC: Recent  Labs  Lab 12/18/23 0332 12/20/23 0541 12/21/23 0500 12/22/23 0500 12/23/23 0402  WBC 9.6 10.5 10.0 8.6 10.8*  HGB 8.2* 8.0* 7.2* 6.8* 8.4*  HCT 22.7* 22.6* 19.8* 18.7* 23.1*  MCV 107.1* 101.8* 102.6* 101.6* 100.0  PLT 345 288 261 246 291   Cardiac Enzymes: No results for input(s): CKTOTAL, CKMB, CKMBINDEX, TROPONINI in the last 168 hours. BNP: Invalid input(s): POCBNP CBG: No results for input(s): GLUCAP in the last 168 hours.  Time coordinating discharge: 50 minutes  Signed:  [name] Cover NP   12/24/2023, 4:56 PM

## 2023-12-24 NOTE — Plan of Care (Signed)
  Problem: Education: Goal: Knowledge of vaso-occlusive preventative measures will improve Outcome: Progressing Goal: Awareness of infection prevention will improve Outcome: Progressing Goal: Awareness of signs and symptoms of anemia will improve Outcome: Progressing

## 2023-12-24 NOTE — Plan of Care (Signed)
  Problem: Education: Goal: Knowledge of vaso-occlusive preventative measures will improve Outcome: Adequate for Discharge Goal: Awareness of infection prevention will improve Outcome: Adequate for Discharge Goal: Awareness of signs and symptoms of anemia will improve Outcome: Adequate for Discharge Goal: Long-term complications will improve Outcome: Adequate for Discharge   Problem: Self-Care: Goal: Ability to incorporate actions that prevent/reduce pain crisis will improve Outcome: Adequate for Discharge   Problem: Bowel/Gastric: Goal: Gut motility will be maintained Outcome: Adequate for Discharge   Problem: Tissue Perfusion: Goal: Complications related to inadequate tissue perfusion will be avoided or minimized Outcome: Adequate for Discharge   Problem: Respiratory: Goal: Pulmonary complications will be avoided or minimized Outcome: Adequate for Discharge Goal: Acute Chest Syndrome will be identified early to prevent complications Outcome: Adequate for Discharge   Problem: Fluid Volume: Goal: Ability to maintain a balanced intake and output will improve Outcome: Adequate for Discharge   Problem: Sensory: Goal: Pain level will decrease with appropriate interventions Outcome: Adequate for Discharge   Problem: Health Behavior: Goal: Postive changes in compliance with treatment and prescription regimens will improve Outcome: Adequate for Discharge   Problem: Education: Goal: Knowledge of General Education information will improve Description: Including pain rating scale, medication(s)/side effects and non-pharmacologic comfort measures Outcome: Adequate for Discharge   Problem: Health Behavior/Discharge Planning: Goal: Ability to manage health-related needs will improve Outcome: Adequate for Discharge   Problem: Clinical Measurements: Goal: Ability to maintain clinical measurements within normal limits will improve Outcome: Adequate for Discharge Goal: Will remain  free from infection Outcome: Adequate for Discharge Goal: Diagnostic test results will improve Outcome: Adequate for Discharge Goal: Respiratory complications will improve Outcome: Adequate for Discharge Goal: Cardiovascular complication will be avoided Outcome: Adequate for Discharge   Problem: Activity: Goal: Risk for activity intolerance will decrease Outcome: Adequate for Discharge   Problem: Nutrition: Goal: Adequate nutrition will be maintained Outcome: Adequate for Discharge   Problem: Coping: Goal: Level of anxiety will decrease Outcome: Adequate for Discharge   Problem: Elimination: Goal: Will not experience complications related to bowel motility Outcome: Adequate for Discharge Goal: Will not experience complications related to urinary retention Outcome: Adequate for Discharge   Problem: Pain Managment: Goal: General experience of comfort will improve and/or be controlled Outcome: Adequate for Discharge   Problem: Safety: Goal: Ability to remain free from injury will improve Outcome: Adequate for Discharge   Problem: Skin Integrity: Goal: Risk for impaired skin integrity will decrease Outcome: Adequate for Discharge

## 2023-12-24 NOTE — TOC Initial Note (Signed)
 Transition of Care [facility]) - Initial/Assessment Note    Patient Details  Name: Martin [name] MRN: [id number] Date of Birth: 10-07-1990  [facility]) CM/SW Contact:    Sonda Manuella Quill, RN Phone Number: 12/24/2023, 3:16 PM  Clinical Narrative:                 Notified by Verneita, RN pt would like to speak w/ RN, CM; spoke w/ pt in room; pt said he would like resources from [employer]; pt said he relocated from [place] [facility] [facility] w/ his family; pt says he lives w/ friends or in his car; he identified POC  Donita Riding (mother) [phone]; pt said he receives a disability check, and he would not have a problem paying for food/housing; pt also said he started his [place] Medicaid application app 2.5 weeks ago; pt said he does not have DME, or HH services; he uses prn oxygen ; pt says he has multiple full travel tanks that he received from [facility] in [place], [place]; pt agreed to receive resources for social services, financial assistance, shelters, [employer], and [employer] [facility]; resources placed in d/c instructions; copies also given to pt; he will make his own appts w/ agencies of choice; no TOC needs.  Expected Discharge Plan: Home/Self Care Barriers to Discharge: No Barriers Identified   Patient Goals and CMS Choice            Expected Discharge Plan and Services   Discharge Planning Services: CM Consult   Living arrangements for the past 2 months: Homeless Expected Discharge Date: 12/24/23               DME Arranged: N/A DME Agency: NA       HH Arranged: NA HH Agency: NA        Prior Living Arrangements/Services Living arrangements for the past 2 months: Homeless Lives with:: Friends Patient language and need for interpreter reviewed:: Yes Do you feel safe going back to the place where you live?: Yes      Need for Family Participation in Patient Care: Yes (Comment) Care giver support system in place?: Yes (comment) Current  home services: DME (home oxygen ) Criminal Activity/Legal Involvement Pertinent to Current Situation/Hospitalization: No - Comment as needed  Activities of Daily Living   ADL Screening (condition at time of admission) Independently performs ADLs?: Yes (appropriate for developmental age) Is the patient deaf or have difficulty hearing?: No Does the patient have difficulty seeing, even when wearing glasses/contacts?: No Does the patient have difficulty concentrating, remembering, or making decisions?: No  Permission Sought/Granted Permission sought to share information with : Case Manager Permission granted to share information with : Yes, Verbal Permission Granted  Share Information with NAME: Case Manager     Permission granted to share info w Relationship: Donita Riding (mother) [phone]     Emotional Assessment Appearance:: Appears stated age Attitude/Demeanor/Rapport: Gracious Affect (typically observed): Accepting Orientation: : Oriented to Self, Oriented to Place, Oriented to  Time, Oriented to Situation Alcohol  / Substance Use: Not Applicable Psych Involvement: No (comment)  Admission diagnosis:  Sickle cell crisis (HCC) [D57.00] Chronic anticoagulation [Z79.01] Sickle cell pain crisis (HCC) [D57.00] 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] Patient Active Problem List   Diagnosis Date Noted   Chronic anticoagulation 12/18/2023   Sickle cell anemia (HCC) 10/28/2023   Sickle cell crisis (HCC) 10/12/2023   Chronic hypoxic respiratory failure (HCC) 08/04/2023   Iron overload, 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   Influenza A with  pneumonia 06/20/2023   Acute hypoxemic respiratory failure (HCC) 06/20/2023   AKI (acute kidney injury) (HCC) 06/20/2023   Influenza A with respiratory manifestations 06/20/2023   Acute on chronic anemia 03/16/2023   History of HIV infection (HCC) 03/16/2023   Hypokalemia 03/16/2023   Anemia of chronic disease 07/13/2022   Hyperbilirubinemia  07/13/2022   Tinea capitis 07/29/2021   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HCC) 06/24/2021   Bacteremia due to Enterococcus 08/29/2020   Acute chest syndrome (HCC) 08/26/2020   Hypoxia 07/09/2020   COVID-19 05/13/2020   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/18/2020   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   Abnormal penile discharge, without blood 02/22/2020   Leukocytosis 01/02/2020   Anxiety 11/27/2019   Proteinuria 11/27/2019   Acute on chronic respiratory failure with hypoxia (HCC) 11/16/2019   Chronic, continuous use of opioids 08/15/2019   Seasonal allergies 08/15/2019   Chest congestion    Sickle cell anemia with crisis (HCC) 08/04/2019   Chronic pain syndrome 08/04/2019   History of pulmonary embolism 08/04/2019   Single subsegmental pulmonary embolism without acute cor pulmonale (HCC) 02/10/2019   Acute chest syndrome due to sickle cell crisis (HCC) 02/10/2019   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) 03/09/2018   Bone pain 03/07/2018   Hip pain 03/07/2018   Acute bronchitis due to Streptococcus 02/21/2018   Heart murmur 02/03/2017   Sickle cell disease with crisis (HCC) 02/02/2017   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 [place] [address] Phone: [phone] Fax: [phone]  [id number] - [place], [place] - [address] AT [facility] [address] [place] [address] Phone: [phone] Fax: [phone]     Social Drivers of Health (SDOH) Social  History: SDOH Screenings   Food Insecurity: No Food Insecurity (12/17/2023)  Housing: High Risk (12/17/2023)  Transportation Needs: No Transportation Needs (12/17/2023)  Recent Concern: Transportation Needs - Unmet Transportation Needs (10/12/2023)  Utilities: Not At Risk (12/17/2023)  Recent Concern: Utilities - At Risk (10/12/2023)  Depression (PHQ2-9): Low Risk  (11/03/2023)  Financial Resource Strain: Low Risk  (10/09/2023)   Received from [facility]  Recent Concern: Financial Resource Strain - Medium Risk (08/03/2023)  Physical Activity: Sufficiently Active (10/09/2023)   Received from [facility]  Social Connections: Socially Isolated (10/09/2023)   Received from [facility]  Stress: No Stress Concern Present (10/09/2023)   Received from [facility]  Tobacco Use: Low Risk  (12/17/2023)   SDOH Interventions:     Readmission Risk Interventions    12/22/2023    4:14 PM 11/10/2023   10:19 AM 10/13/2023    1:11 PM  Readmission Risk Prevention Plan  Transportation Screening Complete Complete Complete  Medication Review Oceanographer) Complete Referral to Pharmacy Complete  PCP or Specialist appointment within 3-5 days of discharge Complete Complete Complete  HRI or Home Care Consult Complete Complete   SW Recovery Care/Counseling Consult Complete Complete Complete  Palliative Care Screening Not Applicable Not Applicable Not Applicable  Skilled Nursing Facility Not Applicable Not Applicable Not Applicable

## 2023-12-24 NOTE — Progress Notes (Signed)
 Reviewed written d/c instructions w pt and all questions answered. He verbalized understanding. D/ C via w/c w all belongings in stable condition.

## 2023-12-24 NOTE — Discharge Instructions (Addendum)
 [facility] ([email or web address])  [facility] ([email or web address])

## 2023-12-27 ENCOUNTER — Telehealth: Payer: Self-pay

## 2023-12-27 NOTE — Transitions of Care (Post Inpatient/ED Visit) (Signed)
   12/27/2023  Name: Martin [name] MRN: [id number] DOB: 1991-01-22  Today's TOC FU Call Status: Today's TOC FU Call Status:: Unsuccessful Call (1st Attempt) Unsuccessful Call (1st Attempt) Date: 12/27/23  Attempted to reach the patient regarding the most recent Inpatient/ED visit.  Follow Up Plan: Additional outreach attempts will be made to reach the patient to complete the Transitions of Care (Post Inpatient/ED visit) call.   Arvin Seip RN, BSN, CCM [employer], Population Health Case Manager Phone: [phone]

## 2023-12-28 ENCOUNTER — Other Ambulatory Visit (HOSPITAL_BASED_OUTPATIENT_CLINIC_OR_DEPARTMENT_OTHER): Payer: Self-pay

## 2023-12-28 ENCOUNTER — Telehealth: Payer: Self-pay

## 2023-12-28 NOTE — Transitions of Care (Post Inpatient/ED Visit) (Signed)
   12/28/2023  Name: Martin [name] MRN: [id number] DOB: 1990-06-23  Today's TOC FU Call Status: Today's TOC FU Call Status:: Unsuccessful Call (2nd Attempt) Unsuccessful Call (2nd Attempt) Date: 12/28/23  Attempted to reach the patient regarding the most recent Inpatient/ED visit.  Follow Up Plan: Additional outreach attempts will be made to reach the patient to complete the Transitions of Care (Post Inpatient/ED visit) call.   Arvin Seip RN, BSN, CCM [employer], Population Health Case Manager Phone: [phone]

## 2023-12-29 ENCOUNTER — Telehealth: Payer: Self-pay

## 2023-12-29 NOTE — Transitions of Care (Post Inpatient/ED Visit) (Signed)
   12/29/2023  Name: Martin [name] MRN: [id number] DOB: 1990-08-08  Today's TOC FU Call Status: Today's TOC FU Call Status:: Unsuccessful Call (3rd Attempt) Unsuccessful Call (3rd Attempt) Date: 12/29/23  Attempted to reach the patient regarding the most recent Inpatient/ED visit.  Follow Up Plan: No further outreach attempts will be made at this time. We have been unable to contact the patient.  Lijah Bourque J. Michaell Grider RN, [name] [facility], [facility] RN Care Manager Direct Dial: [phone]  Fax: [phone] Website: [email or web address]

## 2023-12-30 ENCOUNTER — Other Ambulatory Visit: Payer: Self-pay

## 2023-12-30 ENCOUNTER — Other Ambulatory Visit: Payer: Self-pay | Admitting: Nurse Practitioner

## 2023-12-30 ENCOUNTER — Emergency Department (HOSPITAL_COMMUNITY)
Admission: EM | Admit: 2023-12-30 | Discharge: 2023-12-30 | Disposition: A | Discharge status: Home / Self Care | Payer: Self-pay | Attending: Emergency Medicine | Admitting: Emergency Medicine

## 2023-12-30 ENCOUNTER — Encounter (HOSPITAL_COMMUNITY): Payer: Self-pay

## 2023-12-30 DIAGNOSIS — Z21 Asymptomatic human immunodeficiency virus [HIV] infection status: Secondary | ICD-10-CM | POA: Insufficient documentation

## 2023-12-30 DIAGNOSIS — Z7901 Long term (current) use of anticoagulants: Secondary | ICD-10-CM | POA: Insufficient documentation

## 2023-12-30 DIAGNOSIS — D5700 Hb-SS disease with crisis, unspecified: Secondary | ICD-10-CM | POA: Insufficient documentation

## 2023-12-30 DIAGNOSIS — F1190 Opioid use, unspecified, uncomplicated: Secondary | ICD-10-CM

## 2023-12-30 DIAGNOSIS — D571 Sickle-cell disease without crisis: Secondary | ICD-10-CM

## 2023-12-30 LAB — COMPREHENSIVE METABOLIC PANEL WITH GFR
ALT: 15 U/L (ref 0–44)
AST: 27 U/L (ref 15–41)
Albumin: 4 g/dL (ref 3.5–5.0)
Alkaline Phosphatase: 66 U/L (ref 38–126)
Anion gap: 13 (ref 5–15)
BUN: 10 mg/dL (ref 6–20)
CO2: 20 mmol/L — ABNORMAL LOW (ref 22–32)
Calcium: 8.6 mg/dL — ABNORMAL LOW (ref 8.9–10.3)
Chloride: 108 mmol/L (ref 98–111)
Creatinine, Ser: 0.87 mg/dL (ref 0.61–1.24)
GFR, Estimated: >60 mL/min (ref >60)
Glucose, Bld: 128 mg/dL — ABNORMAL HIGH (ref 70–99)
Potassium: 3.5 mmol/L (ref 3.5–5.1)
Sodium: 141 mmol/L (ref 135–145)
Total Bilirubin: 4.8 mg/dL — ABNORMAL HIGH (ref 0.0–1.2)
Total Protein: 7.6 g/dL (ref 6.5–8.1)

## 2023-12-30 LAB — CBC
HCT: 26.3 % — ABNORMAL LOW (ref 39.0–52.0)
Hemoglobin: 9 g/dL — ABNORMAL LOW (ref 13.0–17.0)
MCH: 36.6 pg — ABNORMAL HIGH (ref 26.0–34.0)
MCHC: 34.2 g/dL (ref 30.0–36.0)
MCV: 106.9 fL — ABNORMAL HIGH (ref 80.0–100.0)
Platelets: 306 K/uL (ref 150–400)
RBC: 2.46 MIL/uL — ABNORMAL LOW (ref 4.22–5.81)
RDW: 27.7 % — ABNORMAL HIGH (ref 11.5–15.5)
WBC: 9.4 K/uL (ref 4.0–10.5)
nRBC: 44 % — ABNORMAL HIGH (ref 0.0–0.2)

## 2023-12-30 LAB — RETICULOCYTES
Immature Retic Fract: 26.5 % — ABNORMAL HIGH (ref 2.3–15.9)
RBC.: 2.5 MIL/uL — ABNORMAL LOW (ref 4.22–5.81)
Retic Count, Absolute: 598.5 K/uL — ABNORMAL HIGH (ref 19.0–186.0)
Retic Ct Pct: 23.9 % — ABNORMAL HIGH (ref 0.4–3.1)

## 2023-12-30 MED ORDER — HEPARIN SOD (PORK) LOCK FLUSH 100 UNIT/ML IV SOLN
500.0000 [IU] | Freq: Once | INTRAVENOUS | Status: AC
  Administered 2023-12-30: 500 [IU]
  Filled 2023-12-30: qty 5

## 2023-12-30 MED ORDER — OXYCODONE-ACETAMINOPHEN 5-325 MG PO TABS
2.0000 | ORAL_TABLET | Freq: Once | ORAL | Status: AC
  Administered 2023-12-30: 2 via ORAL
  Filled 2023-12-30: qty 2

## 2023-12-30 MED ORDER — KETOROLAC TROMETHAMINE 15 MG/ML IJ SOLN
15.0000 mg | Freq: Once | INTRAMUSCULAR | Status: AC
Start: 2023-12-30 — End: 2023-12-30
  Administered 2023-12-30: 7 mg via INTRAVENOUS
  Filled 2023-12-30: qty 1

## 2023-12-30 MED ORDER — HYDROMORPHONE HCL 1 MG/ML IJ SOLN
1.0000 mg | Freq: Once | INTRAMUSCULAR | Status: AC
  Administered 2023-12-30: 1 mg via INTRAVENOUS
  Filled 2023-12-30: qty 1

## 2023-12-30 MED ORDER — ACETAMINOPHEN 500 MG PO TABS
1000.0000 mg | ORAL_TABLET | Freq: Once | ORAL | Status: AC
  Administered 2023-12-30: 1000 mg via ORAL
  Filled 2023-12-30: qty 2

## 2023-12-30 MED ORDER — HYDROMORPHONE HCL 1 MG/ML IJ SOLN
0.5000 mg | Freq: Once | INTRAMUSCULAR | Status: AC
  Administered 2023-12-30: 0.5 mg via INTRAVENOUS
  Filled 2023-12-30: qty 1

## 2023-12-30 MED ORDER — HYDROMORPHONE HCL 1 MG/ML IJ SOLN
2.0000 mg | Freq: Once | INTRAMUSCULAR | Status: AC
  Administered 2023-12-30: 2 mg via INTRAVENOUS
  Filled 2023-12-30: qty 2

## 2023-12-30 NOTE — Telephone Encounter (Signed)
 Patient states he is completely out of pain medication. Patient was at the ER, was discharged this morning, and states that he tried to go to the [facility] but they were full He states he is still in pain about 6-7 out of 10.  He is advised that if he gets any worse to call us  back or go back to the Emergency Room Patient verbalized understanding.

## 2023-12-30 NOTE — Discharge Instructions (Signed)
 Please follow-up with the sickle cell day clinic.  Please return to ED with new symptoms.

## 2023-12-30 NOTE — ED Provider Notes (Signed)
 [place] EMERGENCY DEPARTMENT AT [facility] Provider Note   CSN: [id number] Arrival date & time: 12/30/23  9972     Patient presents with: Sickle Cell Pain Crisis   Martin [name] is a 33 y.o. male with history of sickle cell disease, HIV, chronic anticoagulation, chronic hypoxia.  Patient presents to ED for evaluation of sickle cell pain crisis.  Reports sickle cell pain crisis ongoing for the the duration of today.  Reports he has been taking home oxycodone  at home without relief.  Denies fevers, nausea or vomiting.  Reports pain is all over typical sickle cell pain crises.  Denies any new features tonight.  Denies fevers, chest pain or shortness of breath. Does arrive hypoxic however patient is not wearing his oxygen . He denies shortness of breath.   Sickle Cell Pain Crisis      Prior to Admission medications   Medication Sig Start Date End Date Taking? Authorizing Provider  abacavir -dolutegravir -lamiVUDine  (TRIUMEQ ) 600-50-300 MG tablet Take 1 tablet by mouth daily. 08/13/23   Pearlean Manus, MD  albuterol  (VENTOLIN  HFA) 108 (90 Base) MCG/ACT inhaler Inhale 2 puffs into the lungs every 6 (six) hours as needed for wheezing or shortness of breath. Patient not taking: Reported on 10/20/2023 08/13/23   Pearlean Manus, MD  apixaban  (ELIQUIS ) 5 MG TABS tablet Take 1 tablet (5 mg total) by mouth 2 (two) times daily. 08/13/23   Pearlean Manus, MD  cyclobenzaprine  (FLEXERIL ) 10 MG tablet Take 10 mg by mouth 2 (two) times daily as needed for muscle spasms. 12/22/22   [provider]  Deferasirox  (JADENU ) 360 MG TABS Take 2 tablets (720 mg total) by mouth daily. Patient not taking: Reported on 12/17/2023 11/05/23   [name], [name], [name]  diphenhydrAMINE  (BENADRYL ) 25 MG tablet Take 1 tablet (25 mg total) by mouth every 4 (four) hours as needed for itching. Patient taking differently: Take 50 mg by mouth daily as needed for itching. 08/13/23   Pearlean Manus, MD   folic acid  (FOLVITE ) 400 MCG tablet Take 1 tablet (400 mcg total) by mouth daily. 08/13/23   Pearlean Manus, MD  gabapentin  (NEURONTIN ) 300 MG capsule Take 1 capsule (300 mg total) by mouth at bedtime as needed (nerve pain). Patient taking differently: Take 300 mg by mouth 2 (two) times daily as needed (nerve pain). 08/13/23   Pearlean Manus, MD  guaiFENesin  (MUCINEX ) 600 MG 12 hr tablet Take 1 tablet (600 mg total) by mouth 2 (two) times daily. Patient not taking: Reported on 10/20/2023 08/13/23 08/12/24  Pearlean Manus, MD  hydroxyurea  (HYDREA ) 500 MG capsule Take 4 capsules (2,000 mg total) by mouth at bedtime. 08/13/23   Pearlean Manus, MD  NARCAN  4 MG/0.1ML LIQD nasal spray kit Place 1 spray into the nose as needed (respiratory distress / opioid overdose). 01/07/23   [provider]  oxyCODONE -acetaminophen  (PERCOCET) 10-325 MG tablet Take 1 tablet by mouth every 4 (four) hours as needed for pain. 12/17/23   Cherylene [name] HERO, NP  pantoprazole  (PROTONIX ) 40 MG tablet Take 1 tablet (40 mg total) by mouth daily as needed (acid reflux). Patient not taking: Reported on 10/20/2023 08/13/23   Pearlean Manus, MD  senna-docusate (SENOKOT-S) 8.6-50 MG tablet Take 2 tablets by mouth at bedtime. Patient not taking: Reported on 10/20/2023 08/13/23   Pearlean Manus, MD  triamcinolone  cream (KENALOG ) 0.1 % Apply 1 Application topically 2 (two) times daily. Patient not taking: Reported on 10/27/2023 01/12/23   Tilford Bertram HERO, [name]  Vitamin D , Ergocalciferol , (DRISDOL )  1.25 MG (50000 UNIT) CAPS capsule Take 1 capsule (50,000 Units total) by mouth every 7 (seven) days. 11/05/23   [name], [name], [name]  Vitamin D , Ergocalciferol , (DRISDOL ) 1.25 MG (50000 UNIT) CAPS capsule Take 1 capsule (50,000 Units total) by mouth every 7 (seven) days. 11/09/23   [name], [name], [name]    Allergies: Ketorolac  tromethamine , Tape, and Wound dressing adhesive    Review of Systems  All other systems reviewed and are  negative.   Updated Vital Signs BP 103/62 (BP Location: Left Arm)   Pulse 72   Temp 98 F (36.7 C) (Oral)   Resp 18   SpO2 95%   Physical Exam Vitals and nursing note reviewed.  Constitutional:      General: He is not in acute distress.    Appearance: He is well-developed.  HENT:     Head: Normocephalic and atraumatic.  Eyes:     Conjunctiva/sclera: Conjunctivae normal.  Cardiovascular:     Rate and Rhythm: Normal rate and regular rhythm.     Heart sounds: No murmur heard. Pulmonary:     Effort: Pulmonary effort is normal. No respiratory distress.     Breath sounds: Normal breath sounds.  Abdominal:     Palpations: Abdomen is soft.     Tenderness: There is no abdominal tenderness.  Musculoskeletal:        General: No swelling.     Cervical back: Neck supple.  Skin:    General: Skin is warm and dry.     Capillary Refill: Capillary refill takes less than 2 seconds.  Neurological:     Mental Status: He is alert.  Psychiatric:        Mood and Affect: Mood normal.     (all labs ordered are listed, but only abnormal results are displayed) Labs Reviewed  CBC - Abnormal; Notable for the following components:      Result Value   RBC 2.46 (*)    Hemoglobin 9.0 (*)    HCT 26.3 (*)    MCV 106.9 (*)    MCH 36.6 (*)    RDW 27.7 (*)    nRBC 44.0 (*)    All other components within normal limits  COMPREHENSIVE METABOLIC PANEL WITH GFR - Abnormal; Notable for the following components:   CO2 20 (*)    Glucose, Bld 128 (*)    Calcium  8.6 (*)    Total Bilirubin 4.8 (*)    All other components within normal limits  RETICULOCYTES - Abnormal; Notable for the following components:   Retic Ct Pct 23.9 (*)    RBC. 2.50 (*)    Retic Count, Absolute 598.5 (*)    Immature Retic Fract 26.5 (*)    All other components within normal limits    EKG: None  Radiology: No results found.  Procedures   Medications Ordered in the ED  heparin  lock flush 100 unit/mL (has no  administration in time range)  HYDROmorphone  (DILAUDID ) injection 2 mg (2 mg Intravenous Given 12/30/23 0134)  ketorolac  (TORADOL ) 15 MG/ML injection 15 mg (7 mg Intravenous Given 12/30/23 0127)  acetaminophen  (TYLENOL ) tablet 1,000 mg (1,000 mg Oral Given 12/30/23 0126)  oxyCODONE -acetaminophen  (PERCOCET/ROXICET) 5-325 MG per tablet 2 tablet (2 tablets Oral Given 12/30/23 0144)  HYDROmorphone  (DILAUDID ) injection 1 mg (1 mg Intravenous Given 12/30/23 0314)  HYDROmorphone  (DILAUDID ) injection 0.5 mg (0.5 mg Intravenous Given 12/30/23 0434)    Medical Decision Making Amount and/or Complexity of Data Reviewed Labs: ordered.  Risk OTC drugs. Prescription  drug management.   This is a 33 year old male presenting to the ED out of concern of sickle cell pain crisis.  On exam, the patient is hemodynamically stable.  Afebrile, arrived hypoxic however patient was placed on home oxygen  and oxygen  saturation improved to 95+ percent.  Lung sounds clear bilaterally.  Nontachycardic.  Abdomen soft and compressible.  Neurological examination at baseline.  Patient labs assessed here to include CBC, reticulocytes, CMP.  Patient CBC without leukocytosis, baseline hemoglobin.  CMP gross unremarkable.  Reticulocytes at baseline.  Patient given Toradol , Tylenol , 2 mg Dilaudid , home oxycodone  initially.  Patient reassessed, continued to complain of pain.  Additional 1 mg Dilaudid  administered at this time.  Patient then found to be resting comfortably.  At this time patient to be discharged home.  Advised to follow-up with sickle cell day clinic today.  Advised to return to ED with new symptoms.  Stable to discharge.    Final diagnoses:  Sickle cell pain crisis [facility])    ED Discharge Orders     None          Ruthell Lonni FALCON, PA-C 12/30/23 0440    Bari Charmaine FALCON, MD 01/01/24 2328

## 2023-12-30 NOTE — ED Triage Notes (Signed)
 Pt. Arrives for sickle cell pain. States that he hurts in his whole body. Denies SOB. Last took pain meds at 9pm.

## 2023-12-30 NOTE — Telephone Encounter (Unsigned)
 Copied from CRM [id number]. Topic: Clinical - Medication Refill >> Dec 30, 2023  2:07 PM Turkey B wrote: Medication: oxyCODONE -acetaminophen  (PERCOCET) 10-325 MG tablet  Has the patient contacted their pharmacy? yes  Patient was told to contact pcp  This is the patient's preferred pharmacy:  [facility] [address] [place] [address] Phone: [phone] Fax: [phone]  Is this the correct pharmacy for this prescription? yes  Has the prescription been filled recently? no  Is the patient out of the medication? yes  Has the patient been seen for an appointment in the last year OR does the patient have an upcoming appointment? yes Can we respond through MyChart? yes  Agent: Please be advised that Rx refills may take up to 3 business days. We ask that you follow-up with your pharmacy.

## 2023-12-31 ENCOUNTER — Other Ambulatory Visit (HOSPITAL_COMMUNITY): Payer: Self-pay

## 2023-12-31 ENCOUNTER — Other Ambulatory Visit: Payer: Self-pay | Admitting: Nurse Practitioner

## 2023-12-31 DIAGNOSIS — F1190 Opioid use, unspecified, uncomplicated: Secondary | ICD-10-CM

## 2023-12-31 DIAGNOSIS — D571 Sickle-cell disease without crisis: Secondary | ICD-10-CM

## 2023-12-31 MED ORDER — OXYCODONE-ACETAMINOPHEN 10-325 MG PO TABS
1.0000 | ORAL_TABLET | ORAL | 0 refills | Status: DC | PRN
  Filled 2024-01-01 (×2): qty 90, 15d supply, fill #0

## 2023-12-31 NOTE — Telephone Encounter (Signed)
 Please advise [facility]

## 2024-01-01 ENCOUNTER — Other Ambulatory Visit (HOSPITAL_COMMUNITY): Payer: Self-pay

## 2024-01-03 ENCOUNTER — Emergency Department (HOSPITAL_COMMUNITY): Payer: Self-pay

## 2024-01-03 ENCOUNTER — Encounter (HOSPITAL_COMMUNITY): Payer: Self-pay

## 2024-01-03 ENCOUNTER — Other Ambulatory Visit: Payer: Self-pay

## 2024-01-03 ENCOUNTER — Emergency Department (HOSPITAL_COMMUNITY)
Admission: EM | Admit: 2024-01-03 | Discharge: 2024-01-03 | Disposition: A | Discharge status: Home / Self Care | Payer: Self-pay | Attending: Emergency Medicine | Admitting: Emergency Medicine

## 2024-01-03 ENCOUNTER — Inpatient Hospital Stay (HOSPITAL_COMMUNITY)
Admission: EM | Admit: 2024-01-03 | Discharge: 2024-01-07 | DRG: 812 | Disposition: A | Discharge status: Home / Self Care | Payer: Self-pay | Attending: Internal Medicine | Admitting: Internal Medicine

## 2024-01-03 DIAGNOSIS — F411 Generalized anxiety disorder: Secondary | ICD-10-CM | POA: Diagnosis present

## 2024-01-03 DIAGNOSIS — Z7901 Long term (current) use of anticoagulants: Secondary | ICD-10-CM

## 2024-01-03 DIAGNOSIS — Z9981 Dependence on supplemental oxygen: Secondary | ICD-10-CM

## 2024-01-03 DIAGNOSIS — Z5901 Sheltered homelessness: Secondary | ICD-10-CM

## 2024-01-03 DIAGNOSIS — Z832 Family history of diseases of the blood and blood-forming organs and certain disorders involving the immune mechanism: Secondary | ICD-10-CM

## 2024-01-03 DIAGNOSIS — D72829 Elevated white blood cell count, unspecified: Secondary | ICD-10-CM | POA: Diagnosis present

## 2024-01-03 DIAGNOSIS — D5700 Hb-SS disease with crisis, unspecified: Principal | ICD-10-CM | POA: Diagnosis present

## 2024-01-03 DIAGNOSIS — D638 Anemia in other chronic diseases classified elsewhere: Secondary | ICD-10-CM | POA: Diagnosis present

## 2024-01-03 DIAGNOSIS — G894 Chronic pain syndrome: Secondary | ICD-10-CM | POA: Diagnosis present

## 2024-01-03 DIAGNOSIS — J9621 Acute and chronic respiratory failure with hypoxia: Secondary | ICD-10-CM | POA: Diagnosis present

## 2024-01-03 DIAGNOSIS — Z86711 Personal history of pulmonary embolism: Secondary | ICD-10-CM

## 2024-01-03 DIAGNOSIS — Z21 Asymptomatic human immunodeficiency virus [HIV] infection status: Secondary | ICD-10-CM | POA: Diagnosis present

## 2024-01-03 DIAGNOSIS — Z91048 Other nonmedicinal substance allergy status: Secondary | ICD-10-CM

## 2024-01-03 DIAGNOSIS — J9611 Chronic respiratory failure with hypoxia: Secondary | ICD-10-CM | POA: Diagnosis present

## 2024-01-03 LAB — I-STAT CG4 LACTIC ACID, ED: Lactic Acid, Venous: 1.1 mmol/L (ref 0.5–1.9)

## 2024-01-03 LAB — COMPREHENSIVE METABOLIC PANEL WITH GFR
ALT: 12 U/L (ref 0–44)
AST: 29 U/L (ref 15–41)
Albumin: 3.8 g/dL (ref 3.5–5.0)
Alkaline Phosphatase: 52 U/L (ref 38–126)
Anion gap: 12 (ref 5–15)
BUN: 9 mg/dL (ref 6–20)
CO2: 19 mmol/L — ABNORMAL LOW (ref 22–32)
Calcium: 8 mg/dL — ABNORMAL LOW (ref 8.9–10.3)
Chloride: 113 mmol/L — ABNORMAL HIGH (ref 98–111)
Creatinine, Ser: 0.82 mg/dL (ref 0.61–1.24)
GFR, Estimated: >60 mL/min (ref >60)
Glucose, Bld: 87 mg/dL (ref 70–99)
Potassium: 3.5 mmol/L (ref 3.5–5.1)
Sodium: 145 mmol/L (ref 135–145)
Total Bilirubin: 7.6 mg/dL — ABNORMAL HIGH (ref 0.0–1.2)
Total Protein: 6.8 g/dL (ref 6.5–8.1)

## 2024-01-03 LAB — CBC WITH DIFFERENTIAL/PLATELET
Abs Immature Granulocytes: 0.03 K/uL (ref 0.00–0.07)
Basophils Absolute: 0 K/uL (ref 0.0–0.1)
Basophils Relative: 0 %
Eosinophils Absolute: 0.1 K/uL (ref 0.0–0.5)
Eosinophils Relative: 1 %
HCT: 21.9 % — ABNORMAL LOW (ref 39.0–52.0)
Hemoglobin: 8.1 g/dL — ABNORMAL LOW (ref 13.0–17.0)
Immature Granulocytes: 0 %
Lymphocytes Relative: 30 %
Lymphs Abs: 3.1 K/uL (ref 0.7–4.0)
MCH: 39.5 pg — ABNORMAL HIGH (ref 26.0–34.0)
MCHC: 37 g/dL — ABNORMAL HIGH (ref 30.0–36.0)
MCV: 106.8 fL — ABNORMAL HIGH (ref 80.0–100.0)
Monocytes Absolute: 1.2 K/uL — ABNORMAL HIGH (ref 0.1–1.0)
Monocytes Relative: 12 %
Neutro Abs: 5.7 K/uL (ref 1.7–7.7)
Neutrophils Relative %: 57 %
Platelets: 430 K/uL — ABNORMAL HIGH (ref 150–400)
RBC: 2.05 MIL/uL — ABNORMAL LOW (ref 4.22–5.81)
RDW: 27.9 % — ABNORMAL HIGH (ref 11.5–15.5)
WBC: 10.2 K/uL (ref 4.0–10.5)
nRBC: 7.9 % — ABNORMAL HIGH (ref 0.0–0.2)

## 2024-01-03 LAB — RETICULOCYTES: RBC.: 2.04 MIL/uL — ABNORMAL LOW (ref 4.22–5.81)

## 2024-01-03 LAB — TYPE AND SCREEN
ABO/RH(D): O POS
Antibody Screen: NEGATIVE

## 2024-01-03 MED ORDER — HYDROMORPHONE HCL 1 MG/ML IJ SOLN
2.0000 mg | INTRAMUSCULAR | Status: AC
  Administered 2024-01-03: 2 mg via INTRAVENOUS
  Filled 2024-01-03: qty 2

## 2024-01-03 MED ORDER — DIPHENHYDRAMINE HCL 50 MG/ML IJ SOLN
12.5000 mg | Freq: Once | INTRAMUSCULAR | Status: AC
  Administered 2024-01-03: 12.5 mg via INTRAVENOUS
  Filled 2024-01-03: qty 12.5

## 2024-01-03 MED ORDER — SODIUM CHLORIDE 0.45 % IV SOLN: INTRAVENOUS | Status: DC

## 2024-01-03 MED ORDER — HYDROMORPHONE HCL 1 MG/ML IJ SOLN
1.0000 mg | Freq: Once | INTRAMUSCULAR | Status: AC
  Administered 2024-01-03: 1 mg via INTRAVENOUS
  Filled 2024-01-03: qty 1

## 2024-01-03 MED ORDER — HEPARIN SOD (PORK) LOCK FLUSH 100 UNIT/ML IV SOLN
500.0000 [IU] | Freq: Once | INTRAVENOUS | Status: AC
  Administered 2024-01-03: 500 [IU]
  Filled 2024-01-03: qty 5

## 2024-01-03 NOTE — ED Provider Notes (Signed)
 [name] EMERGENCY DEPARTMENT AT [facility] Provider Note   CSN: [id number] Arrival date & time: 01/03/24  0001     Patient presents with: Sickle Cell Pain Crisis   Martin [name] is a 33 y.o. male with history of sickle cell disease, PE in the past on 3 L supplemental oxygen  at baseline who presents today with concern for sickle cell crisis with pain primarily in the lower back and the hips.  No worsening of his baseline shortness of breath, no chest pain today, no fevers or chills at home.  Patient also has history of HIV.  Endorses compliance with antiretroviral therapy.Taking 10 mg oxycodone  at home without improvement in his pain.  In addition the above listed history patient history of polysubstance use.  He is currently anticoagulated on Eliquis  with which he endorses compliance.   HPI     Prior to Admission medications   Medication Sig Start Date End Date Taking? Authorizing Provider  abacavir -dolutegravir -lamiVUDine  (TRIUMEQ ) 600-50-300 MG tablet Take 1 tablet by mouth daily. 08/13/23   Pearlean Manus, MD  albuterol  (VENTOLIN  HFA) 108 (90 Base) MCG/ACT inhaler Inhale 2 puffs into the lungs every 6 (six) hours as needed for wheezing or shortness of breath. Patient not taking: Reported on 10/20/2023 08/13/23   Pearlean Manus, MD  apixaban  (ELIQUIS ) 5 MG TABS tablet Take 1 tablet (5 mg total) by mouth 2 (two) times daily. 08/13/23   Pearlean Manus, MD  cyclobenzaprine  (FLEXERIL ) 10 MG tablet Take 10 mg by mouth 2 (two) times daily as needed for muscle spasms. 12/22/22   [provider]  Deferasirox  (JADENU ) 360 MG TABS Take 2 tablets (720 mg total) by mouth daily. Patient not taking: Reported on 12/17/2023 11/05/23   [name], [name], [name]  diphenhydrAMINE  (BENADRYL ) 25 MG tablet Take 1 tablet (25 mg total) by mouth every 4 (four) hours as needed for itching. Patient taking differently: Take 50 mg by mouth daily as needed for itching. 08/13/23   Pearlean Manus, MD  folic acid  (FOLVITE ) 400 MCG tablet Take 1 tablet (400 mcg total) by mouth daily. 08/13/23   Pearlean Manus, MD  gabapentin  (NEURONTIN ) 300 MG capsule Take 1 capsule (300 mg total) by mouth at bedtime as needed (nerve pain). Patient taking differently: Take 300 mg by mouth 2 (two) times daily as needed (nerve pain). 08/13/23   Pearlean Manus, MD  guaiFENesin  (MUCINEX ) 600 MG 12 hr tablet Take 1 tablet (600 mg total) by mouth 2 (two) times daily. Patient not taking: Reported on 10/20/2023 08/13/23 08/12/24  Pearlean Manus, MD  hydroxyurea  (HYDREA ) 500 MG capsule Take 4 capsules (2,000 mg total) by mouth at bedtime. 08/13/23   Pearlean Manus, MD  NARCAN  4 MG/0.1ML LIQD nasal spray kit Place 1 spray into the nose as needed (respiratory distress / opioid overdose). 01/07/23   [provider]  oxyCODONE -acetaminophen  (PERCOCET) 10-325 MG tablet Take 1 tablet by mouth every 4 (four) hours as needed for pain. 01/01/24   [name], [name], [name]  pantoprazole  (PROTONIX ) 40 MG tablet Take 1 tablet (40 mg total) by mouth daily as needed (acid reflux). Patient not taking: Reported on 10/20/2023 08/13/23   Pearlean Manus, MD  senna-docusate (SENOKOT-S) 8.6-50 MG tablet Take 2 tablets by mouth at bedtime. Patient not taking: Reported on 10/20/2023 08/13/23   Pearlean Manus, MD  triamcinolone  cream (KENALOG ) 0.1 % Apply 1 Application topically 2 (two) times daily. Patient not taking: Reported on 10/27/2023 01/12/23   Tilford Bertram HERO, [name]  Vitamin  D, Ergocalciferol , (DRISDOL ) 1.25 MG (50000 UNIT) CAPS capsule Take 1 capsule (50,000 Units total) by mouth every 7 (seven) days. 11/05/23   [name], [name], [name]  Vitamin D , Ergocalciferol , (DRISDOL ) 1.25 MG (50000 UNIT) CAPS capsule Take 1 capsule (50,000 Units total) by mouth every 7 (seven) days. 11/09/23   [name], [name], [name]    Allergies: Ketorolac  tromethamine , Tape, and Wound dressing adhesive    Review of Systems  Musculoskeletal:   Positive for myalgias.    Updated Vital Signs BP 98/68   Pulse 72   Temp 98.8 F (37.1 C) (Oral)   Resp 13   Ht 6' 3 (1.905 m)   Wt 63.5 kg   SpO2 92%   BMI 17.50 kg/m   Physical Exam Vitals and nursing note reviewed.  Constitutional:      Appearance: He is not ill-appearing or toxic-appearing.  HENT:     Head: Normocephalic and atraumatic.     Mouth/Throat:     Mouth: Mucous membranes are moist.     Pharynx: No oropharyngeal exudate or posterior oropharyngeal erythema.  Eyes:     General: Scleral icterus (Per patient scleral icterus occurs every time he has a sickle cell crisis.) present.        Right eye: No discharge.        Left eye: No discharge.     Conjunctiva/sclera: Conjunctivae normal.  Cardiovascular:     Rate and Rhythm: Normal rate and regular rhythm.     Pulses: Normal pulses.     Heart sounds: Normal heart sounds. No murmur heard. Pulmonary:     Effort: Pulmonary effort is normal. No respiratory distress.     Breath sounds: Normal breath sounds. No wheezing or rales.  Abdominal:     General: Bowel sounds are normal. There is no distension.     Palpations: Abdomen is soft.     Tenderness: There is no abdominal tenderness. There is no guarding or rebound.  Musculoskeletal:        General: No deformity.     Cervical back: Neck supple.     Right lower leg: No edema.     Left lower leg: No edema.  Skin:    General: Skin is warm and dry.     Capillary Refill: Capillary refill takes less than 2 seconds.  Neurological:     General: No focal deficit present.     Mental Status: He is alert and oriented to person, place, and time. Mental status is at baseline.  Psychiatric:        Mood and Affect: Mood normal.     (all labs ordered are listed, but only abnormal results are displayed) Labs Reviewed  CBC WITH DIFFERENTIAL/PLATELET - Abnormal; Notable for the following components:      Result Value   RBC 2.05 (*)    Hemoglobin 8.1 (*)    HCT 21.9 (*)     MCV 106.8 (*)    MCH 39.5 (*)    MCHC 37.0 (*)    RDW 27.9 (*)    Platelets 430 (*)    nRBC 7.9 (*)    Monocytes Absolute 1.2 (*)    All other components within normal limits  COMPREHENSIVE METABOLIC PANEL WITH GFR - Abnormal; Notable for the following components:   Chloride 113 (*)    CO2 19 (*)    Calcium  8.0 (*)    Total Bilirubin 7.6 (*)    All other components within normal limits  RETICULOCYTES - Abnormal; Notable  for the following components:   RBC. 2.04 (*)    All other components within normal limits  RETICULOCYTES  I-STAT CG4 LACTIC ACID, ED  TYPE AND SCREEN    EKG: None  Radiology: DG Chest 2 View Result Date: 01/03/2024 CLINICAL DATA:  hypoxia EXAM: CHEST - 2 VIEW COMPARISON:  Chest x-ray 12/15/2023 FINDINGS: Right chest wall Port-A-Cath with tip overlying the distal superior vena cava/region of the superior caval junction. The heart and mediastinal contours are within normal limits. No focal consolidation. Chronic coarsened interstitial markings. No overt pulmonary edema. No pleural effusion. No pneumothorax. No acute osseous abnormality. IMPRESSION: No acute cardiopulmonary disease in a patient with underlying chronic coarsened interstitial markings. Electronically Signed   By: Morgane  Naveau M.D.   On: 01/03/2024 01:48     Procedures   Medications Ordered in the ED  0.45 % sodium chloride  infusion (0 mLs Intravenous Stopped 01/03/24 0459)  HYDROmorphone  (DILAUDID ) injection 2 mg (2 mg Intravenous Given 01/03/24 0203)  HYDROmorphone  (DILAUDID ) injection 2 mg (2 mg Intravenous Given 01/03/24 0236)  HYDROmorphone  (DILAUDID ) injection 2 mg (2 mg Intravenous Given 01/03/24 0325)  diphenhydrAMINE  (BENADRYL ) 12.5 mg in sodium chloride  0.9 % 50 mL IVPB (0 mg Intravenous Stopped 01/03/24 0233)  HYDROmorphone  (DILAUDID ) injection 1 mg (1 mg Intravenous Given 01/03/24 0459)  heparin  lock flush 100 unit/mL (500 Units Intracatheter Given 01/03/24 0503)                                     Medical Decision Making 33 year old male presents with sickle cell crisis.  Hypoxic to the 80s on intake, however patient was on room air and he has not 3 L supplemental oxygen  at baseline.  VS otherwise normal. Cardiopulmonary exam is benign. Abdominal exam is benign.  Patient with scleral icterus bilaterally.  Neurovascular status intact bilaterally in all extremities. Uncomfortable appearing, but without acute distress.  Amount and/or Complexity of Data Reviewed Labs: ordered.    Details: CBC with anemia with hemoglobin of 8.1 at baseline.  CMP unremarkable.  Lactic is normal, reticulocyte panel unable to be run due to interfering substance per lab.  Patient unwilling to remain in the emergency department for further evaluation on reticulocyte count.  Radiology: ordered.  Risk Prescription drug management.    He feels significantly improved after IV analgesia and feels he can be transitioned home to his oral meds at this time.  Will follow-up with the patient care center as needed.  Clinical concern for underlying acute chest syndrome is exceedingly low.  Patient remains on his baseline home oxygen  of 3 L/min via nasal cannula at time of discharge with maintenance of reassuring vital signs.  No evidence of neuro deficit or aplastic crisis. Martin [name] voiced understanding of his medical evaluation and treatment plan. Each of their questions answered to their expressed satisfaction.  Return precautions were given.  Patient is well-appearing, stable, and was discharged in good condition.  This chart was dictated using voice recognition software, Dragon. Despite the best efforts of this provider to proofread and correct errors, errors may still occur which can change documentation meaning.      Final diagnoses:  Sickle cell pain crisis [facility])    ED Discharge Orders     None          Bobette Pleasant JONELLE DEVONNA 01/03/24 0601    Carita Senior, MD 01/03/24  [phone]

## 2024-01-03 NOTE — ED Triage Notes (Signed)
 Sickle cell crisis that started today, last dose of home oxycodone  at 2100. Pt 84% on RA on arrival, 96% on 4L [place]. Denies SOB or chest pain.

## 2024-01-03 NOTE — ED Triage Notes (Signed)
 PT. Arrives c/o sickle cell pain. States that the pain is all over his body including his chest. Denies SOB. Took home meds without relief.

## 2024-01-03 NOTE — Discharge Instructions (Signed)
 You were seen in the ER today for your sickle cell crisis. Your pain improved and you expressed wishes to be discharged home. Please follow up with the patient care center as needed and return to the ER with any new severe symptoms

## 2024-01-04 ENCOUNTER — Emergency Department (HOSPITAL_COMMUNITY): Payer: Self-pay

## 2024-01-04 ENCOUNTER — Encounter (HOSPITAL_COMMUNITY): Payer: Self-pay | Admitting: Radiology

## 2024-01-04 LAB — COMPREHENSIVE METABOLIC PANEL WITH GFR
ALT: 12 U/L (ref 0–44)
AST: 24 U/L (ref 15–41)
Albumin: 3.8 g/dL (ref 3.5–5.0)
Alkaline Phosphatase: 57 U/L (ref 38–126)
Anion gap: 10 (ref 5–15)
BUN: 11 mg/dL (ref 6–20)
CO2: 21 mmol/L — ABNORMAL LOW (ref 22–32)
Calcium: 8 mg/dL — ABNORMAL LOW (ref 8.9–10.3)
Chloride: 111 mmol/L (ref 98–111)
Creatinine, Ser: 0.71 mg/dL (ref 0.61–1.24)
GFR, Estimated: >60 mL/min (ref >60)
Glucose, Bld: 83 mg/dL (ref 70–99)
Potassium: 3.8 mmol/L (ref 3.5–5.1)
Sodium: 143 mmol/L (ref 135–145)
Total Bilirubin: 6.9 mg/dL — ABNORMAL HIGH (ref 0.0–1.2)
Total Protein: 6.9 g/dL (ref 6.5–8.1)

## 2024-01-04 LAB — CBC WITH DIFFERENTIAL/PLATELET
Abs Immature Granulocytes: 0.05 K/uL (ref 0.00–0.07)
Basophils Absolute: 0 K/uL (ref 0.0–0.1)
Basophils Relative: 0 %
Eosinophils Absolute: 0.2 K/uL (ref 0.0–0.5)
Eosinophils Relative: 2 %
HCT: 24.5 % — ABNORMAL LOW (ref 39.0–52.0)
Hemoglobin: 8.7 g/dL — ABNORMAL LOW (ref 13.0–17.0)
Immature Granulocytes: 1 %
Lymphocytes Relative: 49 %
Lymphs Abs: 5.5 K/uL — ABNORMAL HIGH (ref 0.7–4.0)
MCH: 36.9 pg — ABNORMAL HIGH (ref 26.0–34.0)
MCHC: 35.5 g/dL (ref 30.0–36.0)
MCV: 103.8 fL — ABNORMAL HIGH (ref 80.0–100.0)
Monocytes Absolute: 1.4 K/uL — ABNORMAL HIGH (ref 0.1–1.0)
Monocytes Relative: 13 %
Neutro Abs: 3.8 K/uL (ref 1.7–7.7)
Neutrophils Relative %: 35 %
Platelets: 493 K/uL — ABNORMAL HIGH (ref 150–400)
RBC: 2.36 MIL/uL — ABNORMAL LOW (ref 4.22–5.81)
RDW: 27.5 % — ABNORMAL HIGH (ref 11.5–15.5)
WBC: 11.1 K/uL — ABNORMAL HIGH (ref 4.0–10.5)
nRBC: 9.1 % — ABNORMAL HIGH (ref 0.0–0.2)

## 2024-01-04 LAB — RETICULOCYTES
Immature Retic Fract: 21.9 % — ABNORMAL HIGH (ref 2.3–15.9)
RBC.: 2.7 MIL/uL — ABNORMAL LOW (ref 4.22–5.81)
Retic Count, Absolute: 639.5 K/uL — ABNORMAL HIGH (ref 19.0–186.0)
Retic Ct Pct: 23.7 % — ABNORMAL HIGH (ref 0.4–3.1)

## 2024-01-04 LAB — D-DIMER, QUANTITATIVE: D-Dimer, Quant: 0.9 ug{FEU}/mL — ABNORMAL HIGH (ref 0.00–0.50)

## 2024-01-04 LAB — TROPONIN T, HIGH SENSITIVITY
Troponin T High Sensitivity: <15 ng/L (ref 0–19)
Troponin T High Sensitivity: 17 ng/L (ref 0–19)

## 2024-01-04 MED ORDER — GABAPENTIN 300 MG PO CAPS
300.0000 mg | ORAL_CAPSULE | Freq: Every evening | ORAL | Status: DC | PRN
  Administered 2024-01-04: 300 mg via ORAL
  Filled 2024-01-04: qty 1

## 2024-01-04 MED ORDER — HYDROMORPHONE HCL 1 MG/ML IJ SOLN
2.0000 mg | Freq: Once | INTRAMUSCULAR | Status: AC
  Administered 2024-01-04: 2 mg via INTRAVENOUS
  Filled 2024-01-04: qty 2

## 2024-01-04 MED ORDER — HYDROMORPHONE 1 MG/ML IV SOLN
INTRAVENOUS | Status: DC
  Administered 2024-01-04: 6.5 mg via INTRAVENOUS
  Administered 2024-01-04: 8 mg via INTRAVENOUS
  Administered 2024-01-04: 30 mg via INTRAVENOUS
  Administered 2024-01-05: 5.5 mg via INTRAVENOUS
  Administered 2024-01-05: 4.5 mg via INTRAVENOUS
  Administered 2024-01-05: 8.5 mg via INTRAVENOUS
  Administered 2024-01-05: 4 mg via INTRAVENOUS
  Administered 2024-01-05: 10 mg via INTRAVENOUS
  Administered 2024-01-05: 1.5 mg via INTRAVENOUS
  Administered 2024-01-05: 30 mg via INTRAVENOUS
  Administered 2024-01-06: 4 mg via INTRAVENOUS
  Administered 2024-01-06: 3 mg via INTRAVENOUS
  Administered 2024-01-06: 8 mg via INTRAVENOUS
  Administered 2024-01-06: 30 mg via INTRAVENOUS
  Administered 2024-01-06: 5 mg via INTRAVENOUS
  Administered 2024-01-06 – 2024-01-07 (×2): 7 mg via INTRAVENOUS
  Administered 2024-01-07: 30 mg via INTRAVENOUS
  Filled 2024-01-04 (×4): qty 30

## 2024-01-04 MED ORDER — OXYCODONE-ACETAMINOPHEN 5-325 MG PO TABS
1.0000 | ORAL_TABLET | ORAL | Status: DC | PRN
  Administered 2024-01-04 – 2024-01-07 (×10): 1 via ORAL
  Filled 2024-01-04 (×10): qty 1

## 2024-01-04 MED ORDER — DIPHENHYDRAMINE HCL 25 MG PO CAPS
25.0000 mg | ORAL_CAPSULE | ORAL | Status: DC | PRN
  Filled 2024-01-04: qty 1
  Filled 2024-01-04: qty 2

## 2024-01-04 MED ORDER — IOHEXOL 350 MG/ML SOLN
80.0000 mL | Freq: Once | INTRAVENOUS | Status: AC | PRN
  Administered 2024-01-04: 75 mL via INTRAVENOUS

## 2024-01-04 MED ORDER — NALOXONE HCL 0.4 MG/ML IJ SOLN: 0.4000 mg | INTRAMUSCULAR | Status: DC | PRN

## 2024-01-04 MED ORDER — SENNOSIDES-DOCUSATE SODIUM 8.6-50 MG PO TABS
2.0000 | ORAL_TABLET | Freq: Every day | ORAL | Status: DC
  Filled 2024-01-04 (×3): qty 2

## 2024-01-04 MED ORDER — POLYETHYLENE GLYCOL 3350 17 G PO PACK: 17.0000 g | PACK | Freq: Every day | ORAL | Status: DC | PRN

## 2024-01-04 MED ORDER — HYDROMORPHONE HCL 1 MG/ML IJ SOLN
1.0000 mg | Freq: Once | INTRAMUSCULAR | Status: AC
  Administered 2024-01-04: 1 mg via INTRAVENOUS
  Filled 2024-01-04: qty 1

## 2024-01-04 MED ORDER — SODIUM CHLORIDE 0.9% FLUSH: 9.0000 mL | INTRAVENOUS | Status: DC | PRN

## 2024-01-04 MED ORDER — CYCLOBENZAPRINE HCL 10 MG PO TABS
10.0000 mg | ORAL_TABLET | Freq: Two times a day (BID) | ORAL | Status: DC | PRN
  Administered 2024-01-05 – 2024-01-07 (×2): 10 mg via ORAL
  Filled 2024-01-04 (×2): qty 1

## 2024-01-04 MED ORDER — DIPHENHYDRAMINE HCL 25 MG PO CAPS
25.0000 mg | ORAL_CAPSULE | ORAL | Status: DC | PRN
  Filled 2024-01-04 (×2): qty 1

## 2024-01-04 MED ORDER — PANTOPRAZOLE SODIUM 40 MG PO TBEC: 40.0000 mg | DELAYED_RELEASE_TABLET | Freq: Every day | ORAL | Status: DC | PRN

## 2024-01-04 MED ORDER — APIXABAN 5 MG PO TABS
5.0000 mg | ORAL_TABLET | Freq: Two times a day (BID) | ORAL | Status: DC
  Administered 2024-01-04 – 2024-01-07 (×6): 5 mg via ORAL
  Filled 2024-01-04 (×6): qty 1

## 2024-01-04 MED ORDER — HYDROXYUREA 500 MG PO CAPS
2000.0000 mg | ORAL_CAPSULE | Freq: Every day | ORAL | Status: DC
  Administered 2024-01-04 – 2024-01-06 (×3): 2000 mg via ORAL
  Filled 2024-01-04 (×3): qty 4

## 2024-01-04 MED ORDER — OXYCODONE HCL 5 MG PO TABS
5.0000 mg | ORAL_TABLET | ORAL | Status: DC | PRN
Start: 2024-01-04 — End: 2024-01-07
  Administered 2024-01-04 – 2024-01-07 (×10): 5 mg via ORAL
  Filled 2024-01-04 (×10): qty 1

## 2024-01-04 MED ORDER — OXYCODONE-ACETAMINOPHEN 5-325 MG PO TABS
2.0000 | ORAL_TABLET | Freq: Once | ORAL | Status: AC
  Administered 2024-01-04: 2 via ORAL
  Filled 2024-01-04: qty 2

## 2024-01-04 MED ORDER — HYDROCORTISONE 1 % EX CREA
TOPICAL_CREAM | Freq: Four times a day (QID) | CUTANEOUS | Status: AC
  Administered 2024-01-04: 1 via TOPICAL
  Filled 2024-01-04: qty 28

## 2024-01-04 MED ORDER — DEFERASIROX 360 MG PO TABS: 720.0000 mg | ORAL_TABLET | Freq: Every day | ORAL | Status: DC

## 2024-01-04 MED ORDER — OXYCODONE-ACETAMINOPHEN 10-325 MG PO TABS: 1.0000 | ORAL_TABLET | ORAL | Status: DC | PRN

## 2024-01-04 MED ORDER — HYDROMORPHONE HCL 1 MG/ML IJ SOLN
2.0000 mg | INTRAMUSCULAR | Status: DC | PRN
  Administered 2024-01-04: 2 mg via INTRAVENOUS
  Filled 2024-01-04: qty 2

## 2024-01-04 MED ORDER — ALBUTEROL SULFATE (2.5 MG/3ML) 0.083% IN NEBU: 3.0000 mL | INHALATION_SOLUTION | Freq: Four times a day (QID) | RESPIRATORY_TRACT | Status: DC | PRN

## 2024-01-04 MED ORDER — FOLIC ACID 1 MG PO TABS
500.0000 ug | ORAL_TABLET | Freq: Every day | ORAL | Status: DC
  Administered 2024-01-05 – 2024-01-07 (×3): 0.5 mg via ORAL
  Filled 2024-01-04 (×4): qty 1

## 2024-01-04 MED ORDER — ABACAVIR-DOLUTEGRAVIR-LAMIVUD 600-50-300 MG PO TABS
1.0000 | ORAL_TABLET | Freq: Every day | ORAL | Status: DC
  Administered 2024-01-04 – 2024-01-06 (×3): 1 via ORAL
  Filled 2024-01-04 (×4): qty 1

## 2024-01-04 MED ORDER — ONDANSETRON HCL 4 MG/2ML IJ SOLN: 4.0000 mg | Freq: Four times a day (QID) | INTRAMUSCULAR | Status: DC | PRN

## 2024-01-04 MED ORDER — SODIUM CHLORIDE 0.45 % IV SOLN: INTRAVENOUS | Status: DC

## 2024-01-04 NOTE — ED Provider Notes (Signed)
 [place] [facility]MENT AT [facility] Provider Note  CSN: [id number] Arrival date & time: 01/03/24 2306  Chief Complaint(s) Sickle Cell Pain Crisis  HPI Martin [name] is a 33 y.o. 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  This includes chest pain that began earlier in the day.  Pain is left-sided worse with deep breathing.  No coughing or congestion.  No fevers or chills.  No new shortness of breath. Patient has been taking his home pain medicine without relief.  The history is provided by the patient.  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   Influenza A with pneumonia 06/20/2023   Acute hypoxemic respiratory failure (HCC) 06/20/2023   AKI (acute kidney injury) (HCC) 06/20/2023   Influenza A with respiratory manifestations 06/20/2023   Acute on chronic anemia 03/16/2023   History of HIV infection (HCC) 03/16/2023   Hypokalemia 03/16/2023   Anemia of chronic disease 07/13/2022   Hyperbilirubinemia 07/13/2022   Tinea capitis 07/29/2021   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HCC) 06/24/2021   Bacteremia due to Enterococcus 08/29/2020   Acute chest syndrome (HCC) 08/26/2020   Hypoxia 07/09/2020   COVID-19 05/13/2020   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/18/2020   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   Abnormal penile discharge, without blood 02/22/2020   Leukocytosis 01/02/2020   Anxiety 11/27/2019   Proteinuria  11/27/2019   Acute on chronic respiratory failure with hypoxia (HCC) 11/16/2019   Chronic, continuous use of opioids 08/15/2019   Seasonal allergies 08/15/2019   Chest congestion    Sickle cell anemia with crisis (HCC) 08/04/2019   Chronic pain syndrome 08/04/2019   History of pulmonary embolism 08/04/2019   Single subsegmental pulmonary embolism without acute cor pulmonale (HCC) 02/10/2019   Acute chest syndrome due to sickle cell crisis (HCC) 02/10/2019   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) 03/09/2018   Bone pain 03/07/2018   Hip pain 03/07/2018   Acute bronchitis due to Streptococcus 02/21/2018   Heart murmur 02/03/2017   Sickle cell disease with crisis (HCC) 02/02/2017   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 Authorizing Provider  abacavir -dolutegravir -lamiVUDine  (TRIUMEQ ) 600-50-300 MG tablet Take 1 tablet by mouth daily. 08/13/23   Pearlean Manus, MD  albuterol  (VENTOLIN  HFA) 108 (90 Base) MCG/ACT inhaler Inhale 2 puffs into the lungs every 6 (six) hours as needed for wheezing or shortness of breath. Patient not taking: Reported on 10/20/2023 08/13/23   Pearlean Manus, MD  apixaban  (ELIQUIS ) 5 MG TABS tablet Take 1 tablet (5 mg total)  by mouth 2 (two) times daily. 08/13/23   Pearlean Manus, MD  cyclobenzaprine  (FLEXERIL ) 10 MG tablet Take 10 mg by mouth 2 (two) times daily as needed for muscle spasms. 12/22/22   [provider]  Deferasirox  (JADENU ) 360 MG TABS Take 2 tablets (720 mg total) by mouth daily. Patient  not taking: Reported on 12/17/2023 11/05/23   [name], [name], [name]  diphenhydrAMINE  (BENADRYL ) 25 MG tablet Take 1 tablet (25 mg total) by mouth every 4 (four) hours as needed for itching. Patient taking differently: Take 50 mg by mouth daily as needed for itching. 08/13/23   Pearlean Manus, MD  folic acid  (FOLVITE ) 400 MCG tablet Take 1 tablet (400 mcg total) by mouth daily. 08/13/23   Pearlean Manus, MD  gabapentin  (NEURONTIN ) 300 MG capsule Take 1 capsule (300 mg total) by mouth at bedtime as needed (nerve pain). Patient taking differently: Take 300 mg by mouth 2 (two) times daily as needed (nerve pain). 08/13/23   Pearlean Manus, MD  guaiFENesin  (MUCINEX ) 600 MG 12 hr tablet Take 1 tablet (600 mg total) by mouth 2 (two) times daily. Patient not taking: Reported on 10/20/2023 08/13/23 08/12/24  Pearlean Manus, MD  hydroxyurea  (HYDREA ) 500 MG capsule Take 4 capsules (2,000 mg total) by mouth at bedtime. 08/13/23   Pearlean Manus, MD  NARCAN  4 MG/0.1ML LIQD nasal spray kit Place 1 spray into the nose as needed (respiratory distress / opioid overdose). 01/07/23   [provider]  oxyCODONE -acetaminophen  (PERCOCET) 10-325 MG tablet Take 1 tablet by mouth every 4 (four) hours as needed for pain. 01/01/24   [name], [name], [name]  pantoprazole  (PROTONIX ) 40 MG tablet Take 1 tablet (40 mg total) by mouth daily as needed (acid reflux). Patient not taking: Reported on 10/20/2023 08/13/23   Pearlean Manus, MD  senna-docusate (SENOKOT-S) 8.6-50 MG tablet Take 2 tablets by mouth at bedtime. Patient not taking: Reported on 10/20/2023 08/13/23   Pearlean Manus, MD  triamcinolone  cream (KENALOG ) 0.1 % Apply 1 Application topically 2 (two) times daily. Patient not taking: Reported on 10/27/2023 01/12/23   Tilford Bertram HERO, [name]  Vitamin D , Ergocalciferol , (DRISDOL ) 1.25 MG (50000 UNIT) CAPS capsule Take 1 capsule (50,000 Units total) by mouth every 7 (seven) days. 11/05/23   [name], [name], [name]   Vitamin D , Ergocalciferol , (DRISDOL ) 1.25 MG (50000 UNIT) CAPS capsule Take 1 capsule (50,000 Units total) by mouth every 7 (seven) days. 11/09/23   [name], [name], [name]                                                                                                                                    Allergies Ketorolac  tromethamine , Tape, and Wound dressing adhesive  Review of Systems Review of Systems As noted in HPI  Physical Exam Vital Signs  I have reviewed the triage vital signs BP 99/60   Pulse 72   Temp 98.1 F (36.7 C) (Oral)  Resp 14   SpO2 93%   Physical Exam Vitals reviewed.  Constitutional:      General: He is not in acute distress.    Appearance: He is well-developed. He is not diaphoretic.  HENT:     Head: Normocephalic and atraumatic.     Right Ear: External ear normal.     Left Ear: External ear normal.     Nose: Nose normal.     Mouth/Throat:     Mouth: Mucous membranes are moist.  Eyes:     General: No scleral icterus.    Conjunctiva/sclera: Conjunctivae normal.  Neck:     Trachea: Phonation normal.  Cardiovascular:     Rate and Rhythm: Normal rate and regular rhythm.  Pulmonary:     Effort: Pulmonary effort is normal. No respiratory distress.     Breath sounds: No stridor.  Chest:     Chest wall: Tenderness present.    Abdominal:     General: There is no distension.  Musculoskeletal:        General: Normal range of motion.     Cervical back: Normal range of motion.  Neurological:     Mental Status: He is alert and oriented to person, place, and time.  Psychiatric:        Behavior: Behavior normal.     ED Results and Treatments Labs (all labs ordered are listed, but only abnormal results are displayed) Labs Reviewed  COMPREHENSIVE METABOLIC PANEL WITH GFR - Abnormal; Notable for the following components:      Result Value   CO2 21 (*)    Calcium  8.0 (*)    Total Bilirubin 6.9 (*)    All other components within normal limits   CBC WITH DIFFERENTIAL/PLATELET - Abnormal; Notable for the following components:   WBC 11.1 (*)    RBC 2.36 (*)    Hemoglobin 8.7 (*)    HCT 24.5 (*)    MCV 103.8 (*)    MCH 36.9 (*)    RDW 27.5 (*)    Platelets 493 (*)    nRBC 9.1 (*)    Lymphs Abs 5.5 (*)    Monocytes Absolute 1.4 (*)    All other components within normal limits  RETICULOCYTES - Abnormal; Notable for the following components:   Retic Ct Pct 23.7 (*)    RBC. 2.70 (*)    Retic Count, Absolute 639.5 (*)    Immature Retic Fract 21.9 (*)    All other components within normal limits  D-DIMER, QUANTITATIVE - Abnormal; Notable for the following components:   D-Dimer, Quant 0.90 (*)    All other components within normal limits  TROPONIN T, HIGH SENSITIVITY  TROPONIN T, HIGH SENSITIVITY                                                                                                                         EKG  EKG Interpretation Date/Time:  Monday January 03 2024 23:22:19 EDT Ventricular Rate:  84 PR  Interval:  156 QRS Duration:  87 QT Interval:  398 QTC Calculation: 471 R Axis:   96  Text Interpretation: Sinus rhythm Probable inferior infarct, old Abnrm T, consider ischemia, anterolateral lds Confirmed by Randol Simmonds [phone]) on 01/03/2024 11:27:06 PM       Radiology No results found.  Medications Ordered in ED Medications  0.45 % sodium chloride  infusion ( Intravenous New Bag/Given 01/04/24 0253)  diphenhydrAMINE  (BENADRYL ) capsule 25-50 mg (has no administration in time range)  HYDROmorphone  (DILAUDID ) injection 2 mg (2 mg Intravenous Given 01/04/24 0211)  HYDROmorphone  (DILAUDID ) injection 1 mg (1 mg Intravenous Given 01/04/24 0255)  oxyCODONE -acetaminophen  (PERCOCET/ROXICET) 5-325 MG per tablet 2 tablet (2 tablets Oral Given 01/04/24 0508)  iohexol  (OMNIPAQUE ) 350 MG/ML injection 80 mL (75 mLs Intravenous Contrast Given 01/04/24 0743)  HYDROmorphone  (DILAUDID ) injection 2 mg (2 mg Intravenous Given 01/04/24  9272)   Procedures Procedures  (including critical care time) Medical Decision Making / ED Course   Medical Decision Making Amount and/or Complexity of Data Reviewed Labs: ordered. Decision-making details documented in ED Course. Radiology: ordered and independent interpretation performed. Decision-making details documented in ED Course. ECG/medicine tests: ordered and independent interpretation performed. Decision-making details documented in ED Course.  Risk Prescription drug management. Decision regarding hospitalization.    Pain related to sickle cell. Chest pain is said today. Differential diagnosis considered EKG without acute ischemic changes or evidence of pericarditis.  Serial troponins negative x 2 Dimer slightly elevated will need to obtain a CT to rule out PE.  Rest of the labs are reassuring and not concerning for hemolytic or aplastic crisis.  Patient provided with IV fluids and 3 rounds of IV pain medicine.  Currently awaiting CTA.  Patient care turned over to oncoming provider. Patient case and results discussed in detail; please see their note for further ED managment.       Final Clinical Impression(s) / ED Diagnoses Final diagnoses:  None    This chart was dictated using voice recognition software.  Despite best efforts to proofread,  errors can occur which can change the documentation meaning.    Trine Raynell Moder, MD 01/04/24 [phone]

## 2024-01-04 NOTE — Plan of Care (Signed)

## 2024-01-04 NOTE — ED Provider Notes (Addendum)
 CT angio raises concerns about acute chest syndrome.  Patient still uncomfortable.  Will contact sickle cell admitting team for admission.  No evidence of pulmonary embolus.   Ezme Duch, MD 01/04/24 0940  Sickle cell team will evaluate patient for admission.   Mayrin Schmuck, MD 01/04/24 1029

## 2024-01-04 NOTE — H&P (Signed)
 H&P  Patient Demographics:  Martin [name], is a 33 y.o. male  MRN: [id number]   DOB - 04/18/1991  Admit Date - 01/03/2024  Outpatient Primary MD for the patient is [name], [name], [name]  Chief Complaint  Patient presents with   Sickle Cell Pain Crisis      HPI:   Martin [name]  is a 33 y.o. male with history of sickle cell disease, history of acute chest syndrome, multiple sickle cell crisis, multiple ED visits and hospital admission, history of PE currently on Eliquis , chronic hypoxic respiratory failure on home oxygen , HIV infection and generalized anxiety disorder who presented to the ED with major complaints of generalized body pain and left side chest pain that is not consistent with his typical sickle cell pain crisis. Last took 1 tablet of percocet 10/325 mg at home today with no resolution to pain symptoms.  He denies, cough, fever, N/V/D. No sick contacts,  No urinary symptoms.    Review of systems:  In addition to the HPI above, patient reports No fever or chills No Headache, No changes with vision or hearing No problems swallowing food or liquids No  cough or shortness of breath No abdominal pain, No nausea or vomiting, Bowel movements are regular No blood in stool or urine No dysuria No new skin rashes or bruises No new joints pains-aches No new weakness, tingling, numbness in any extremity No recent weight gain or loss No polyuria, polydypsia or polyphagia No significant Mental Stressors  A full 10 point Review of Systems was done, except as stated above, all other Review of Systems were negative.  With Past History of the following :   Past Medical History:  Diagnosis Date   Anxiety    HIV (human immunodeficiency virus infection) (HCC)    Proteinuria    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HCC)    Vitamin D  deficiency 10/2018      Past Surgical History:  Procedure Laterality Date   IR IMAGING GUIDED PORT INSERTION  08/29/2019   IR REMOVAL TUN ACCESS W/ PORT  W/O FL MOD SED  08/30/2020   TEE WITHOUT CARDIOVERSION N/A 08/30/2020   Procedure: TRANSESOPHAGEAL ECHOCARDIOGRAM (TEE);  Surgeon: Mona Vinie BROCKS, MD;  Location: [facility] ENDOSCOPY;  Service: Cardiovascular;  Laterality: N/A;     Social History:   Social History   Tobacco Use   Smoking status: Never   Smokeless tobacco: Never  Substance Use Topics   Alcohol  use: No     Lives - At home   Family History :   Family History  Problem Relation Age of Onset   Sickle cell trait Mother    Sickle cell trait Father    Birth defects Maternal Grandmother    Birth defects Paternal Grandmother      Home Medications:   Prior to Admission medications   Medication Sig Start Date End Date Taking? Authorizing Provider  abacavir -dolutegravir -lamiVUDine  (TRIUMEQ ) 600-50-300 MG tablet Take 1 tablet by mouth daily. 08/13/23   Pearlean Manus, MD  albuterol  (VENTOLIN  HFA) 108 (90 Base) MCG/ACT inhaler Inhale 2 puffs into the lungs every 6 (six) hours as needed for wheezing or shortness of breath. Patient not taking: Reported on 10/20/2023 08/13/23   Pearlean Manus, MD  apixaban  (ELIQUIS ) 5 MG TABS tablet Take 1 tablet (5 mg total) by mouth 2 (two) times daily. 08/13/23   Pearlean Manus, MD  cyclobenzaprine  (FLEXERIL ) 10 MG tablet Take 10 mg by mouth 2 (two) times daily as needed for muscle spasms. 12/22/22  [provider]  Deferasirox  (JADENU ) 360 MG TABS Take 2 tablets (720 mg total) by mouth daily. Patient not taking: Reported on 12/17/2023 11/05/23   [name], [name], [name]  diphenhydrAMINE  (BENADRYL ) 25 MG tablet Take 1 tablet (25 mg total) by mouth every 4 (four) hours as needed for itching. Patient taking differently: Take 50 mg by mouth daily as needed for itching. 08/13/23   Pearlean Manus, MD  folic acid  (FOLVITE ) 400 MCG tablet Take 1 tablet (400 mcg total) by mouth daily. 08/13/23   Pearlean Manus, MD  gabapentin  (NEURONTIN ) 300 MG capsule Take 1 capsule (300 mg total) by mouth at  bedtime as needed (nerve pain). Patient taking differently: Take 300 mg by mouth 2 (two) times daily as needed (nerve pain). 08/13/23   Pearlean Manus, MD  guaiFENesin  (MUCINEX ) 600 MG 12 hr tablet Take 1 tablet (600 mg total) by mouth 2 (two) times daily. Patient not taking: Reported on 10/20/2023 08/13/23 08/12/24  Pearlean Manus, MD  hydroxyurea  (HYDREA ) 500 MG capsule Take 4 capsules (2,000 mg total) by mouth at bedtime. 08/13/23   Pearlean Manus, MD  NARCAN  4 MG/0.1ML LIQD nasal spray kit Place 1 spray into the nose as needed (respiratory distress / opioid overdose). 01/07/23   [provider]  oxyCODONE -acetaminophen  (PERCOCET) 10-325 MG tablet Take 1 tablet by mouth every 4 (four) hours as needed for pain. 01/01/24   [name], [name], [name]  pantoprazole  (PROTONIX ) 40 MG tablet Take 1 tablet (40 mg total) by mouth daily as needed (acid reflux). Patient not taking: Reported on 10/20/2023 08/13/23   Pearlean Manus, MD  senna-docusate (SENOKOT-S) 8.6-50 MG tablet Take 2 tablets by mouth at bedtime. Patient not taking: Reported on 10/20/2023 08/13/23   Pearlean Manus, MD  triamcinolone  cream (KENALOG ) 0.1 % Apply 1 Application topically 2 (two) times daily. Patient not taking: Reported on 10/27/2023 01/12/23   Tilford Bertram HERO, [name]  Vitamin D , Ergocalciferol , (DRISDOL ) 1.25 MG (50000 UNIT) CAPS capsule Take 1 capsule (50,000 Units total) by mouth every 7 (seven) days. 11/05/23   [name], [name], [name]  Vitamin D , Ergocalciferol , (DRISDOL ) 1.25 MG (50000 UNIT) CAPS capsule Take 1 capsule (50,000 Units total) by mouth every 7 (seven) days. 11/09/23   [name], [name], [name]     Allergies:   Allergies  Allergen Reactions   Ketorolac  Tromethamine  Swelling and Other (See Comments)    Patient reports facial edema and left arm edema after administration.   Tape Rash and Other (See Comments)    PLEASE DO NOT USE THE CLEAR, THICK, PLASTIC TAPE- only paper tape is tolerated    Wound Dressing  Adhesive Rash     Physical Exam:   Vitals:   Vitals:   01/04/24 0733 01/04/24 0837  BP: 99/60 105/72  Pulse: 72 68  Resp: 14 12  Temp:  97.7 F (36.5 C)  SpO2: 93% 91%    Physical Exam: Constitutional: Patient appears well-developed and well-nourished. Not in obvious distress. HENT: Normocephalic, atraumatic, External right and left ear normal. Oropharynx is clear and moist.  Eyes: Conjunctivae and EOM are normal. PERRLA, no scleral icterus. Neck: Normal ROM. Neck supple. No JVD. No tracheal deviation. No thyromegaly. CVS: RRR, S1/S2 +, no murmurs, no gallops, no carotid bruit.  Pulmonary: Effort and breath sounds normal, no stridor, rhonchi, wheezes, rales.  Abdominal: Soft. BS +, no distension, tenderness, rebound or guarding.  Musculoskeletal: Generalize body tenderness Lymphadenopathy: No lymphadenopathy noted, cervical, inguinal or axillary Neuro: Alert. Normal reflexes, muscle tone coordination.  No cranial nerve deficit. Skin: Skin is warm and dry. No rash noted. Not diaphoretic. No erythema. No pallor. Psychiatric: Normal mood and affect. Behavior, judgment, thought content normal.   Data Review:   CBC Recent Labs  Lab 12/30/23 0136 01/03/24 0149 01/04/24 0228  WBC 9.4 10.2 11.1*  HGB 9.0* 8.1* 8.7*  HCT 26.3* 21.9* 24.5*  PLT 306 430* 493*  MCV 106.9* 106.8* 103.8*  MCH 36.6* 39.5* 36.9*  MCHC 34.2 37.0* 35.5  RDW 27.7* 27.9* 27.5*  LYMPHSABS  --  3.1 5.5*  MONOABS  --  1.2* 1.4*  EOSABS  --  0.1 0.2  BASOSABS  --  0.0 0.0   ------------------------------------------------------------------------------------------------------------------  Chemistries  Recent Labs  Lab 12/30/23 0136 01/03/24 0149 01/04/24 0228  NA 141 145 143  K 3.5 3.5 3.8  CL 108 113* 111  CO2 20* 19* 21*  GLUCOSE 128* 87 83  BUN 10 9 11   CREATININE 0.87 0.82 0.71  CALCIUM  8.6* 8.0* 8.0*  AST 27 29 24   ALT 15 12 12   ALKPHOS 66 52 57  BILITOT 4.8* 7.6* 6.9*    ------------------------------------------------------------------------------------------------------------------ estimated creatinine clearance is 118 mL/min (by C-G formula based on SCr of 0.71 mg/dL). ------------------------------------------------------------------------------------------------------------------ No results for input(s): TSH, T4TOTAL, T3FREE, THYROIDAB in the last 72 hours.  Invalid input(s): FREET3  Coagulation profile No results for input(s): INR, PROTIME in the last 168 hours. ------------------------------------------------------------------------------------------------------------------- Recent Labs    01/04/24 0228  DDIMER 0.90*   -------------------------------------------------------------------------------------------------------------------  Cardiac Enzymes No results for input(s): CKMB, TROPONINI, MYOGLOBIN in the last 168 hours.  Invalid input(s): CK ------------------------------------------------------------------------------------------------------------------    Component Value Date/Time   BNP 91.4 05/27/2021 2216    ---------------------------------------------------------------------------------------------------------------  Urinalysis    Component Value Date/Time   COLORURINE AMBER (A) 06/20/2023 0311   APPEARANCEUR CLOUDY (A) 06/20/2023 0311   LABSPEC 1.014 06/20/2023 0311   PHURINE 5.0 06/20/2023 0311   GLUCOSEU NEGATIVE 06/20/2023 0311   HGBUR MODERATE (A) 06/20/2023 0311   BILIRUBINUR NEGATIVE 06/20/2023 0311   BILIRUBINUR small (A) 03/18/2021 1218   BILIRUBINUR neg 11/27/2019 1149   KETONESUR NEGATIVE 06/20/2023 0311   PROTEINUR 100 (A) 06/20/2023 0311   UROBILINOGEN 2.0 (A) 03/18/2021 1218   UROBILINOGEN 4.0 (H) 05/21/2009 1944   NITRITE NEGATIVE 06/20/2023 0311   LEUKOCYTESUR NEGATIVE 06/20/2023 0311     ----------------------------------------------------------------------------------------------------------------   Imaging Results:    CT Angio Chest PE W and/or Wo Contrast Result Date: 01/04/2024 CLINICAL DATA:  Sickle cell pain. Chest pain. Clinical concern for pulmonary embolus. EXAM: CT ANGIOGRAPHY CHEST WITH CONTRAST TECHNIQUE: Multidetector CT imaging of the chest was performed using the standard protocol during bolus administration of intravenous contrast. Multiplanar CT image reconstructions and MIPs were obtained to evaluate the vascular anatomy. RADIATION DOSE REDUCTION: This exam was performed according to the departmental dose-optimization program which includes automated exposure control, adjustment of the mA and/or kV according to patient size and/or use of iterative reconstruction technique. CONTRAST:  75mL OMNIPAQUE  IOHEXOL  350 MG/ML SOLN COMPARISON:  10/15/2023 FINDINGS: Cardiovascular: Heart size upper normal. No substantial pericardial effusion. Right Port-A-Cath tip is positioned in the distal SVC near the RA junction. There is no filling defect within the opacified pulmonary arteries to suggest the presence of an acute pulmonary embolus. Enlargement of the pulmonary outflow tract/main pulmonary arteries suggests pulmonary arterial hypertension. Mediastinum/Nodes: Scattered small mediastinal and hilar lymph nodes evident. The esophagus has normal imaging features. Small axillary lymph nodes evident bilaterally. Lungs/Pleura: Interval progression of patchy ground-glass opacity in both lungs  with scattered areas of architectural distortion and atelectasis. Interlobular septal thickening noted towards the lung bases. 4 mm left upper lobe nodule on 45/12 is new in the interval. Upper Abdomen: Auto splenectomy. Musculoskeletal: No worrisome lytic or sclerotic osseous abnormality. Review of the MIP images confirms the above findings. IMPRESSION: 1. No CT evidence for acute pulmonary  embolus. 2. Interval progression of patchy ground-glass opacity in both lungs with scattered areas of architectural distortion and atelectasis. Findings are basilar predominant with associated interlobular septal thickening. Imaging features are nonspecific but can be seen in the setting of acute chest syndrome. Pulmonary edema or atypical infection would also be considerations. 3. Enlargement of the pulmonary outflow tract/main pulmonary arteries suggests pulmonary arterial hypertension. 4. 4 mm left upper lobe pulmonary nodule, new in the interval. No follow-up needed if patient is low-risk.This recommendation follows the consensus statement: Guidelines for Management of Incidental Pulmonary Nodules Detected on CT Images: From the Fleischner Society 2017; Radiology 2017; 284:228-243. Electronically Signed   By: Camellia Candle M.D.   On: 01/04/2024 08:14   DG Chest 2 View Result Date: 01/03/2024 CLINICAL DATA:  hypoxia EXAM: CHEST - 2 VIEW COMPARISON:  Chest x-ray 12/15/2023 FINDINGS: Right chest wall Port-A-Cath with tip overlying the distal superior vena cava/region of the superior caval junction. The heart and mediastinal contours are within normal limits. No focal consolidation. Chronic coarsened interstitial markings. No overt pulmonary edema. No pleural effusion. No pneumothorax. No acute osseous abnormality. IMPRESSION: No acute cardiopulmonary disease in a patient with underlying chronic coarsened interstitial markings. Electronically Signed   By: Morgane  Naveau M.D.   On: 01/03/2024 01:48     Assessment & Plan:  Active Problems:   Generalized anxiety disorder   HIV (human immunodeficiency virus infection) (HCC)   Chronic pain syndrome   History of pulmonary embolism   Leukocytosis   Sickle cell anemia with pain (HCC)   Acute on chronic respiratory failure with hypoxia (HCC)   Anemia of chronic disease   Hb Sickle Cell Disease with crisis: Admit patient, start IVF 0.45% Saline @ 125  mls/hour, start weight based Dilaudid  PCA,  IV Toradol  is contraindicated for patient due to allergy (swelling and itching) with a  history of AKI. Restart oral home pain medications, Monitor vitals very closely, Re-evaluate pain scale regularly, 2 L of Oxygen  by [place], Patient will be re-evaluated for pain in the context of function and relationship to baseline as care progresses. Leukocytosis: Stable Anemia of Chronic Disease: Hgb within patients baseline, no need for transfusion at this time. Will continue to monitor daily CBC  Chronic pain Syndrome: Continue oral home pain medications as ordered.  Asymptomatic HIV infection: Clinically stable.  Will continue home medication. Chronic hypoxic respiratory failure: Patient is saturating well on 2 L of oxygen .  Continue incentive spirometry.  Ambulate on the hallway. History of pulmonary embolism: Continue anticoagulation.  DVT Prophylaxis: Subcut Lovenox    AM Labs Ordered, also please review Full Orders  Family Communication: Admission, patient's condition and plan of care including tests being ordered have been discussed with the patient who indicate understanding and agree with the plan and Code Status.  Code Status: Full Code  Consults called: None    Admission status: Inpatient    Time spent in minutes : 50 minutes  [name] Cover NP  01/04/2024 at 11:41 AM

## 2024-01-05 LAB — CBC
HCT: 24.8 % — ABNORMAL LOW (ref 39.0–52.0)
Hemoglobin: 9 g/dL — ABNORMAL LOW (ref 13.0–17.0)
MCH: 37.7 pg — ABNORMAL HIGH (ref 26.0–34.0)
MCHC: 36.3 g/dL — ABNORMAL HIGH (ref 30.0–36.0)
MCV: 103.8 fL — ABNORMAL HIGH (ref 80.0–100.0)
Platelets: 531 K/uL — ABNORMAL HIGH (ref 150–400)
RBC: 2.39 MIL/uL — ABNORMAL LOW (ref 4.22–5.81)
RDW: 27.1 % — ABNORMAL HIGH (ref 11.5–15.5)
WBC: 11.1 K/uL — ABNORMAL HIGH (ref 4.0–10.5)
nRBC: 10.8 % — ABNORMAL HIGH (ref 0.0–0.2)

## 2024-01-05 MED ORDER — SODIUM CHLORIDE 0.9% FLUSH: 10.0000 mL | INTRAVENOUS | Status: DC | PRN

## 2024-01-05 MED ORDER — SODIUM CHLORIDE 0.9% FLUSH
10.0000 mL | Freq: Two times a day (BID) | INTRAVENOUS | Status: DC
  Administered 2024-01-05 – 2024-01-07 (×5): 10 mL

## 2024-01-05 MED ORDER — BISACODYL 10 MG RE SUPP
10.0000 mg | Freq: Once | RECTAL | Status: AC
  Administered 2024-01-05: 10 mg via RECTAL
  Filled 2024-01-05: qty 1

## 2024-01-05 MED ORDER — CHLORHEXIDINE GLUCONATE CLOTH 2 % EX PADS
6.0000 | MEDICATED_PAD | Freq: Every day | CUTANEOUS | Status: DC
  Administered 2024-01-05 – 2024-01-07 (×3): 6 via TOPICAL

## 2024-01-05 NOTE — Progress Notes (Signed)
 Patient ID: Martin [name], male   DOB: 1990/12/02, 33 y.o.   MRN: [id number] Subjective: Martin [name]  is a 33 y.o. male with history of sickle cell disease, history of acute chest syndrome, multiple sickle cell crisis, multiple ED visits and hospital admission, history of PE currently on Eliquis , chronic hypoxic respiratory failure on home oxygen , HIV infection and generalized anxiety disorder who presented to the ED with major complaints of generalized body pain and left side chest pain that is not consistent with his typical sickle cell pain crisis.   He is reporting significant improvement to  pain of 7/10. Denies fever, cough, N/V/D. No urinary symptoms.   Objective:  Vital signs in last 24 hours:  Vitals:   01/05/24 0709 01/05/24 1131 01/05/24 1145 01/05/24 1213  BP:  99/65    Pulse:  81    Resp: 16 14 16 16   Temp:  98 F (36.7 C)    TempSrc:      SpO2:  91%  92%  Weight:      Height:        Intake/Output from previous day:   Intake/Output Summary (Last 24 hours) at 01/05/2024 1239 Last data filed at 01/05/2024 0300 Gross per 24 hour  Intake 794.63 ml  Output 240 ml  Net 554.63 ml    Physical Exam: General: Alert, awake, oriented x3, in no acute distress.  HEENT: [place]/AT PEERL, EOMI Neck: Trachea midline,  no masses, no thyromegal,y no JVD, no carotid bruit OROPHARYNX:  Moist, No exudate/ erythema/lesions.  Heart: Regular rate and rhythm, without murmurs, rubs, gallops, PMI non-displaced, no heaves or thrills on palpation.  Lungs: Clear to auscultation, no wheezing or rhonchi noted. No increased vocal fremitus resonant to percussion  Abdomen: Soft, nontender, nondistended, positive bowel sounds, no masses no hepatosplenomegaly noted..  Neuro: No focal neurological deficits noted cranial nerves II through XII grossly intact. DTRs 2+ bilaterally upper and lower extremities. Strength 5 out of 5 in bilateral upper and lower extremities. Musculoskeletal: Generalize  body tenderness Psychiatric: Patient alert and oriented x3, good insight and cognition, good recent to remote recall. Lymph node survey: No cervical axillary or inguinal lymphadenopathy noted.  Lab Results:  Basic Metabolic Panel:    Component Value Date/Time   NA 143 01/04/2024 0228   NA 140 01/12/2023 1607   K 3.8 01/04/2024 0228   CL 111 01/04/2024 0228   CO2 21 (L) 01/04/2024 0228   BUN 11 01/04/2024 0228   BUN 8 01/12/2023 1607   CREATININE 0.71 01/04/2024 0228   GLUCOSE 83 01/04/2024 0228   CALCIUM  8.0 (L) 01/04/2024 0228   CBC:    Component Value Date/Time   WBC 11.1 (H) 01/05/2024 0454   HGB 9.0 (L) 01/05/2024 0454   HGB 7.4 (L) 01/12/2023 1607   HCT 24.8 (L) 01/05/2024 0454   HCT 21.7 (L) 01/12/2023 1607   PLT 531 (H) 01/05/2024 0454   PLT 321 01/12/2023 1607   MCV 103.8 (H) 01/05/2024 0454   MCV 94 01/12/2023 1607   NEUTROABS 3.8 01/04/2024 0228   NEUTROABS 3.8 01/12/2023 1607   LYMPHSABS 5.5 (H) 01/04/2024 0228   LYMPHSABS 5.6 (H) 01/12/2023 1607   MONOABS 1.4 (H) 01/04/2024 0228   EOSABS 0.2 01/04/2024 0228   EOSABS 0.2 01/12/2023 1607   BASOSABS 0.0 01/04/2024 0228   BASOSABS 0.1 01/12/2023 1607    No results found for this or any previous visit (from the past 240 hours).  Studies/Results: CT Angio Chest PE W and/or Wo  Contrast Result Date: 01/04/2024 CLINICAL DATA:  Sickle cell pain. Chest pain. Clinical concern for pulmonary embolus. EXAM: CT ANGIOGRAPHY CHEST WITH CONTRAST TECHNIQUE: Multidetector CT imaging of the chest was performed using the standard protocol during bolus administration of intravenous contrast. Multiplanar CT image reconstructions and MIPs were obtained to evaluate the vascular anatomy. RADIATION DOSE REDUCTION: This exam was performed according to the departmental dose-optimization program which includes automated exposure control, adjustment of the mA and/or kV according to patient size and/or use of iterative reconstruction  technique. CONTRAST:  75mL OMNIPAQUE  IOHEXOL  350 MG/ML SOLN COMPARISON:  10/15/2023 FINDINGS: Cardiovascular: Heart size upper normal. No substantial pericardial effusion. Right Port-A-Cath tip is positioned in the distal SVC near the RA junction. There is no filling defect within the opacified pulmonary arteries to suggest the presence of an acute pulmonary embolus. Enlargement of the pulmonary outflow tract/main pulmonary arteries suggests pulmonary arterial hypertension. Mediastinum/Nodes: Scattered small mediastinal and hilar lymph nodes evident. The esophagus has normal imaging features. Small axillary lymph nodes evident bilaterally. Lungs/Pleura: Interval progression of patchy ground-glass opacity in both lungs with scattered areas of architectural distortion and atelectasis. Interlobular septal thickening noted towards the lung bases. 4 mm left upper lobe nodule on 45/12 is new in the interval. Upper Abdomen: Auto splenectomy. Musculoskeletal: No worrisome lytic or sclerotic osseous abnormality. Review of the MIP images confirms the above findings. IMPRESSION: 1. No CT evidence for acute pulmonary embolus. 2. Interval progression of patchy ground-glass opacity in both lungs with scattered areas of architectural distortion and atelectasis. Findings are basilar predominant with associated interlobular septal thickening. Imaging features are nonspecific but can be seen in the setting of acute chest syndrome. Pulmonary edema or atypical infection would also be considerations. 3. Enlargement of the pulmonary outflow tract/main pulmonary arteries suggests pulmonary arterial hypertension. 4. 4 mm left upper lobe pulmonary nodule, new in the interval. No follow-up needed if patient is low-risk.This recommendation follows the consensus statement: Guidelines for Management of Incidental Pulmonary Nodules Detected on CT Images: From the Fleischner Society 2017; Radiology 2017; 284:228-243. Electronically Signed   By:  Camellia Candle M.D.   On: 01/04/2024 08:14    Medications: Scheduled Meds:  abacavir -dolutegravir -lamiVUDine   1 tablet Oral QHS   apixaban   5 mg Oral BID   Chlorhexidine  Gluconate Cloth  6 each Topical Daily   folic acid   500 mcg Oral Daily   hydrocortisone  cream   Topical QID   HYDROmorphone    Intravenous Q4H   hydroxyurea   2,000 mg Oral QHS   senna-docusate  2 tablet Oral QHS   sodium chloride  flush  10-40 mL Intracatheter Q12H   Continuous Infusions: PRN Meds:.albuterol , cyclobenzaprine , diphenhydrAMINE , gabapentin , naloxone  **AND** sodium chloride  flush, ondansetron  (ZOFRAN ) IV, oxyCODONE -acetaminophen  **AND** oxyCODONE , pantoprazole , polyethylene glycol, sodium chloride  flush  Consultants: None  Procedures: None  Antibiotics: None  Assessment/Plan: Active Problems:   Generalized anxiety disorder   HIV (human immunodeficiency virus infection) (HCC)   Chronic pain syndrome   History of pulmonary embolism   Leukocytosis   Sickle cell anemia with pain (HCC)   Acute on chronic respiratory failure with hypoxia (HCC)   Anemia of chronic disease   Hb Sickle Cell Disease with Pain crisis: Continue IVF 0.45% Saline @ KVO, continue weight based Dilaudid  PCA, continue oral home pain medications as ordered. Monitor vitals very closely, Re-evaluate pain scale regularly, 2 L of Oxygen  by [place]. Patient encouraged to ambulate on the hallway today.  Leukocytosis: slightly elevated most likely due to active crisis, no s/s of  acute infection, will monitor without antibiotics. Monitor daily cbc   Anemia of Chronic Disease: Hgb at base line , will continue to monitor daily CBC Chronic pain Syndrome: Continue oral home pain medications as ordered.  Asymptomatic HIV infection: Clinically stable.  Will continue home medication. Chronic hypoxic respiratory failure: Patient is saturating well, on 2 L of oxygen .  Continue incentive spirometry.  Ambulate on the hallway. Generalized anxiety  disorder: Patient is clinically stable.  He denies any suicidal ideations or thoughts.  Continue home medications. History of pulmonary embolism: Continue anticoagulation.   Code Status: Full Code Family Communication: N/A Disposition Plan: Not yet ready for discharge  [name] Cover NP   If 7PM-7AM, please contact night-coverage.  01/05/2024, 12:39 PM  LOS: 1 day

## 2024-01-05 NOTE — Plan of Care (Signed)

## 2024-01-06 LAB — CBC
HCT: 24.3 % — ABNORMAL LOW (ref 39.0–52.0)
Hemoglobin: 8.9 g/dL — ABNORMAL LOW (ref 13.0–17.0)
MCH: 36.8 pg — ABNORMAL HIGH (ref 26.0–34.0)
MCHC: 36.6 g/dL — ABNORMAL HIGH (ref 30.0–36.0)
MCV: 100.4 fL — ABNORMAL HIGH (ref 80.0–100.0)
Platelets: 498 K/uL — ABNORMAL HIGH (ref 150–400)
RBC: 2.42 MIL/uL — ABNORMAL LOW (ref 4.22–5.81)
RDW: 24.9 % — ABNORMAL HIGH (ref 11.5–15.5)
WBC: 11.4 K/uL — ABNORMAL HIGH (ref 4.0–10.5)
nRBC: 6.1 % — ABNORMAL HIGH (ref 0.0–0.2)

## 2024-01-06 NOTE — Progress Notes (Signed)
 Patient ID: [name], male   DOB: [birth year], 33 y.o.   MRN: [id number] Subjective: Martin [name]  is a 33 y.o. male with history of sickle cell disease, history of acute chest syndrome, multiple sickle cell crisis, multiple ED visits and hospital admission, history of PE currently on Eliquis , chronic hypoxic respiratory failure on home oxygen , HIV infection and generalized anxiety disorder who presented to the ED with major complaints of generalized body pain and left side chest pain that is not consistent with his typical sickle cell pain crisis.   Per patient pain is better today. He rates pain as 7/10. Denies fever, cough, N/V/D. No urinary symptoms.   Objective:  Vital signs in last 24 hours:  Vitals:   01/06/24 0644 01/06/24 1015 01/06/24 1133 01/06/24 1411  BP:  100/67  95/65  Pulse:  90  87  Resp: 18 15 15 15   Temp:  98.3 F (36.8 C)  98.2 F (36.8 C)  TempSrc:  Oral  Oral  SpO2: 92% (!) 87%  92%  Weight:      Height:        Intake/Output from previous day:   Intake/Output Summary (Last 24 hours) at 01/06/2024 1432 Last data filed at 01/06/2024 1050 Gross per 24 hour  Intake 240 ml  Output 1400 ml  Net -1160 ml    Physical Exam: General: Alert, awake, oriented x3, in no acute distress.  HEENT: [place]/AT PEERL, EOMI Neck: Trachea midline,  no masses, no thyromegal,y no JVD, no carotid bruit OROPHARYNX:  Moist, No exudate/ erythema/lesions.  Heart: Regular rate and rhythm, without murmurs, rubs, gallops, PMI non-displaced, no heaves or thrills on palpation.  Lungs: Clear to auscultation, no wheezing or rhonchi noted. No increased vocal fremitus resonant to percussion  Abdomen: Soft, nontender, nondistended, positive bowel sounds, no masses no hepatosplenomegaly noted..  Neuro: No focal neurological deficits noted cranial nerves II through XII grossly intact. DTRs 2+ bilaterally upper and lower extremities. Strength 5 out of 5 in bilateral upper and lower  extremities. Musculoskeletal: Generalize body tenderness Psychiatric: Patient alert and oriented x3, good insight and cognition, good recent to remote recall. Lymph node survey: No cervical axillary or inguinal lymphadenopathy noted.  Lab Results:  Basic Metabolic Panel:    Component Value Date/Time   NA 143 01/04/2024 0228   NA 140 01/12/2023 1607   K 3.8 01/04/2024 0228   CL 111 01/04/2024 0228   CO2 21 (L) 01/04/2024 0228   BUN 11 01/04/2024 0228   BUN 8 01/12/2023 1607   CREATININE 0.71 01/04/2024 0228   GLUCOSE 83 01/04/2024 0228   CALCIUM  8.0 (L) 01/04/2024 0228   CBC:    Component Value Date/Time   WBC 11.4 (H) 01/06/2024 0433   HGB 8.9 (L) 01/06/2024 0433   HGB 7.4 (L) 01/12/2023 1607   HCT 24.3 (L) 01/06/2024 0433   HCT 21.7 (L) 01/12/2023 1607   PLT 498 (H) 01/06/2024 0433   PLT 321 01/12/2023 1607   MCV 100.4 (H) 01/06/2024 0433   MCV 94 01/12/2023 1607   NEUTROABS 3.8 01/04/2024 0228   NEUTROABS 3.8 01/12/2023 1607   LYMPHSABS 5.5 (H) 01/04/2024 0228   LYMPHSABS 5.6 (H) 01/12/2023 1607   MONOABS 1.4 (H) 01/04/2024 0228   EOSABS 0.2 01/04/2024 0228   EOSABS 0.2 01/12/2023 1607   BASOSABS 0.0 01/04/2024 0228   BASOSABS 0.1 01/12/2023 1607    No results found for this or any previous visit (from the past 240 hours).  Studies/Results: No results  found.   Medications: Scheduled Meds:  abacavir -dolutegravir -lamiVUDine   1 tablet Oral QHS   apixaban   5 mg Oral BID   Chlorhexidine  Gluconate Cloth  6 each Topical Daily   folic acid   500 mcg Oral Daily   HYDROmorphone    Intravenous Q4H   hydroxyurea   2,000 mg Oral QHS   senna-docusate  2 tablet Oral QHS   sodium chloride  flush  10-40 mL Intracatheter Q12H   Continuous Infusions: PRN Meds:.albuterol , cyclobenzaprine , diphenhydrAMINE , gabapentin , naloxone  **AND** sodium chloride  flush, ondansetron  (ZOFRAN ) IV, oxyCODONE -acetaminophen  **AND** oxyCODONE , pantoprazole , polyethylene glycol, sodium chloride   flush  Consultants: None  Procedures: None  Antibiotics: None  Assessment/Plan: Active Problems:   Generalized anxiety disorder   HIV (human immunodeficiency virus infection) (HCC)   Chronic pain syndrome   History of pulmonary embolism   Leukocytosis   Sickle cell anemia with pain (HCC)   Acute on chronic respiratory failure with hypoxia (HCC)   Anemia of chronic disease   Hb Sickle Cell Disease with Pain crisis: Continue IVF 0.45% Saline @ KVO, continue weight based Dilaudid  PCA, continue oral home pain medications as ordered. Monitor vitals very closely, Re-evaluate pain scale regularly, 2 L of Oxygen  by . Patient encouraged to ambulate on the hallway today.  Leukocytosis: slightly elevated most likely due to active crisis, no s/s of acute infection, will monitor without antibiotics. Monitor daily cbc   Anemia of Chronic Disease: Hgb at baseline,  will continue to monitor daily CBC Chronic pain Syndrome: Continue oral home pain medications as ordered.  Asymptomatic HIV infection: Clinically stable.  Will continue home medication. Chronic hypoxic respiratory failure: Patient is saturating well, on 2 L of oxygen .  Continue incentive spirometry.  Ambulate on the hallway. Generalized anxiety disorder: Patient is clinically stable.  He denies any suicidal ideations or thoughts.  Continue home medications. History of pulmonary embolism: Continue anticoagulation.   Code Status: Full Code Family Communication: N/A Disposition Plan: Not yet ready for discharge  [name] Cover NP   If 7PM-7AM, please contact night-coverage.  01/06/2024, 2:32 PM  LOS: 2 days

## 2024-01-06 NOTE — Plan of Care (Signed)

## 2024-01-06 NOTE — TOC Initial Note (Addendum)
 Transition of Care [facility]) - Initial/Assessment Note    Patient Details  Name: Martin [name] MRN: [id number] Date of Birth: [birth year]  [facility]) CM/SW Contact:    Toy LITTIE Agar, RN Phone Number:[phone]  01/06/2024, 12:37 PM  Clinical Narrative:                 IP case management following patient with high risk for readmission. Patient is from home where he is currently homeless. Patient states that he sleeps in his car or stays at his sisters house. Patient confirms that he just received homeless resources from IP care management on his last admission but did not find the resources very helpful. Patient states that he has had help previously from [facility]. Patient states that he would like to reconnect with this program. CM will attempt to locate resources for the program.   1427 CM has printed info from the [employer] site related to the [employer] . This info includes contact number for the patient to call to inquire about services. Application for patient assistance unable to print due to site maintenance.   Expected Discharge Plan: Homeless Shelter (patient reports sleeping in car or staying at sisters house) Barriers to Discharge: No Barriers Identified, Continued Medical Work up   Patient Goals and CMS Choice Patient states their goals for this hospitalization and ongoing recovery are:: For pain to be better CMS Medicare.gov Compare Post Acute Care list provided to::  (n/a) Choice offered to / list presented to : NA [place] ownership interest in [facility].provided to::  (n/a)    Expected Discharge Plan and Services In-house Referral: NA Discharge Planning Services: CM Consult Post Acute Care Choice: NA Living arrangements for the past 2 months: Homeless (patient states that he either sleeps in his car or at his sisters home)                 DME Arranged: N/A DME Agency: NA        HH Arranged: NA HH Agency: NA        Prior Living Arrangements/Services Living arrangements for the past 2 months: Homeless (patient states that he either sleeps in his car or at his sisters home) Lives with:: Siblings, Other (Comment) (patient states that sometimes he sleeps in his care and others he sleeps at his sisters house.) Patient language and need for interpreter reviewed:: Yes Do you feel safe going back to the place where you live?: Yes      Need for Family Participation in Patient Care: Yes (Comment) Care giver support system in place?: Yes (comment) Current home services: DME (Home O2) Criminal Activity/Legal Involvement Pertinent to Current Situation/Hospitalization: No - Comment as needed  Activities of Daily Living   ADL Screening (condition at time of admission) Independently performs ADLs?: Yes (appropriate for developmental age) Is the patient deaf or have difficulty hearing?: No Does the patient have difficulty seeing, even when wearing glasses/contacts?: No Does the patient have difficulty concentrating, remembering, or making decisions?: No  Permission Sought/Granted            Permission granted to share info w Relationship: Renaye Riding [phone]  Permission granted to share info w Contact Information: mother  Emotional Assessment Appearance:: Appears stated age Attitude/Demeanor/Rapport: Gracious Affect (typically observed): Accepting Orientation: : Oriented to Self, Oriented to Place, Oriented to  Time, Oriented to Situation Alcohol  / Substance Use: Not Applicable Psych Involvement: No (comment)  Admission  diagnosis:  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] Patient Active Problem List   Diagnosis Date Noted   Chronic anticoagulation 12/18/2023   Sickle cell anemia (HCC) 10/28/2023   Sickle cell crisis (HCC) 10/12/2023   Chronic hypoxic respiratory failure (HCC) 08/04/2023   Iron overload, 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   Influenza A with  pneumonia 06/20/2023   Acute hypoxemic respiratory failure (HCC) 06/20/2023   AKI (acute kidney injury) (HCC) 06/20/2023   Influenza A with respiratory manifestations 06/20/2023   Acute on chronic anemia 03/16/2023   History of HIV infection (HCC) 03/16/2023   Hypokalemia 03/16/2023   Anemia of chronic disease 07/13/2022   Hyperbilirubinemia 07/13/2022   Tinea capitis 07/29/2021   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HCC) 06/24/2021   Bacteremia due to Enterococcus 08/29/2020   Acute chest syndrome (HCC) 08/26/2020   Hypoxia 07/09/2020   COVID-19 05/13/2020   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/18/2020   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   Abnormal penile discharge, without blood 02/22/2020   Leukocytosis 01/02/2020   Anxiety 11/27/2019   Proteinuria 11/27/2019   Acute on chronic respiratory failure with hypoxia (HCC) 11/16/2019   Chronic, continuous use of opioids 08/15/2019   Seasonal allergies 08/15/2019   Chest congestion    Sickle cell anemia with crisis (HCC) 08/04/2019   Chronic pain syndrome 08/04/2019   History of pulmonary embolism 08/04/2019   Single subsegmental pulmonary embolism without acute cor pulmonale (HCC) 02/10/2019   Acute chest syndrome due to sickle cell crisis (HCC) 02/10/2019   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) 03/09/2018   Bone pain 03/07/2018   Hip pain 03/07/2018   Acute bronchitis due to Streptococcus 02/21/2018   Heart murmur 02/03/2017   Sickle cell disease with crisis (HCC) 02/02/2017   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 [place] [address] Phone: [phone] Fax: [phone]     Social Drivers of Health (SDOH) Social History: SDOH Screenings   Food Insecurity: No Food Insecurity (01/04/2024)  Housing: Low Risk  (01/04/2024)  Recent Concern: Housing - High Risk (12/17/2023)  Transportation Needs: No Transportation Needs (01/04/2024)  Recent Concern: Transportation Needs - Unmet Transportation Needs (10/12/2023)  Utilities: Not At Risk (01/04/2024)  Recent Concern: Utilities - At Risk (10/12/2023)  Depression (PHQ2-9): Low Risk  (11/03/2023)  Financial Resource Strain: Low Risk  (10/09/2023)   [facility]  Recent Concern: Financial Resource Strain - Medium Risk (08/03/2023)  Physical Activity: Sufficiently Active (10/09/2023)   Received from [facility]  Social Connections: Socially Isolated (01/04/2024)  Stress: No Stress Concern Present (10/09/2023)   Received from [facility]  Tobacco Use: Low Risk  (01/03/2024)   SDOH Interventions:     Readmission Risk Interventions    01/06/2024   12:15 PM 12/22/2023    4:14 PM 11/10/2023   10:19 AM  Readmission Risk Prevention Plan  Transportation Screening Complete Complete Complete  Medication Review Ocean[employer]) Complete Complete Referral to Pharmacy  PCP or Specialist appointment within 3-5 days of discharge Complete Complete Complete  HRI or Home Care Consult Complete Complete Complete  SW Recovery Care/Counseling Consult Complete Complete Complete  Palliative Care Screening Not Applicable Not Applicable Not Applicable  Skilled Nursing Facility Not Applicable Not Applicable Not Applicable

## 2024-01-06 NOTE — Plan of Care (Signed)

## 2024-01-06 NOTE — Plan of Care (Signed)

## 2024-01-07 LAB — CBC
HCT: 22.3 % — ABNORMAL LOW (ref 39.0–52.0)
Hemoglobin: 8.1 g/dL — ABNORMAL LOW (ref 13.0–17.0)
MCH: 35.8 pg — ABNORMAL HIGH (ref 26.0–34.0)
MCHC: 36.3 g/dL — ABNORMAL HIGH (ref 30.0–36.0)
MCV: 98.7 fL (ref 80.0–100.0)
Platelets: 460 K/uL — ABNORMAL HIGH (ref 150–400)
RBC: 2.26 MIL/uL — ABNORMAL LOW (ref 4.22–5.81)
RDW: 25 % — ABNORMAL HIGH (ref 11.5–15.5)
WBC: 8.7 K/uL (ref 4.0–10.5)
nRBC: 3.8 % — ABNORMAL HIGH (ref 0.0–0.2)

## 2024-01-07 MED ORDER — ORAL CARE MOUTH RINSE: 15.0000 mL | OROMUCOSAL | Status: DC | PRN

## 2024-01-07 NOTE — Discharge Summary (Signed)
 Physician Discharge Summary  Martin [name] FMW:[id number] DOB: 1990/05/27 DOA: 01/03/2024  PCP: [name], [name], [name]  Admit date: 01/03/2024  Discharge date: 01/07/2024  Discharge Diagnoses:  Active Problems:   Generalized anxiety disorder   HIV (human immunodeficiency virus infection) (HCC)   Chronic pain syndrome   History of pulmonary embolism   Leukocytosis   Sickle cell anemia with pain (HCC)   Acute on chronic respiratory failure with hypoxia (HCC)   Anemia of chronic disease   Discharge Condition: Stable  Disposition:  Pt is discharged home in good condition and is to follow up with [name], [name], [name] this week to have labs evaluated. Martin [name] is instructed to increase activity slowly and balance with rest for the next few days, and use prescribed medication to complete treatment of pain  Diet: Regular Wt Readings from Last 3 Encounters:  01/04/24 63 kg  01/03/24 63.5 kg  12/18/23 67.2 kg    History of present illness:  Martin [name]  is a 33 y.o. male with history of sickle cell disease, history of acute chest syndrome, multiple sickle cell crisis, multiple ED visits and hospital admission, history of PE currently on Eliquis , chronic hypoxic respiratory failure on home oxygen , HIV infection and generalized anxiety disorder who presented to the ED with major complaints of generalized body pain and left side chest pain that is not consistent with his typical sickle cell pain crisis.  Patient took home medication with no resolution to pain symptoms prior to ED visit. He denies, cough, fever, N/V/D. No sick contacts,  No urinary symptoms.   ED Course: Patient presented to the emergency department with uncontrolled pain.  He was treated with IV fluids, IV Dilaudid  with no resolution to pain symptoms.  Patient was therefore admitted for ongoing management of sickle cell pain. BP 98/68  Pulse 72  Temp 98.8 F (37.1 C) (Oral)  Resp 13  Ht 6' 3  (1.905 m)  Wt 63.5 kg  SpO2 92%  BMI 17.50 kg/m    Hospital Course:  Patient was admitted for sickle cell pain crisis and managed appropriately with IVF, IV Dilaudid  via PCA and IV Toradol , as well as other adjunct therapies per sickle cell pain management protocols. Patient is reporting improved pain back to baseline.  He is able to tolerate p.o. without nausea or vomiting.  He is ambulating without assistance and asked to be discharged home.  Patient was therefore discharged home today in a hemodynamically stable condition.   Martin [name] will follow-up with PCP within 1 week of this discharge. Martin [name] was counseled extensively about nonpharmacologic means of pain management, patient verbalized understanding and was appreciative of  the care received during this admission.   We discussed the need for good hydration, monitoring of hydration status, avoidance of heat, cold, stress, and infection triggers. We discussed the need to be adherent with taking home medications. Patient was reminded of the need to seek medical attention immediately if any symptom of bleeding, anemia, or infection occurs.  Discharge Exam: Vitals:   01/07/24 1214 01/07/24 1526  BP:    Pulse:    Resp: 15 19  Temp:    SpO2: 96% 98%   Vitals:   01/07/24 0818 01/07/24 0949 01/07/24 1214 01/07/24 1526  BP:  96/63    Pulse:  84    Resp: 15 15 15 19   Temp:  97.8 F (36.6 C)    TempSrc:  Oral    SpO2: 93% 96% 96% 98%  Weight:  Height:        General appearance : Awake, alert, not in any distress. Speech Clear. Not toxic looking HEENT: Atraumatic and Normocephalic, pupils equally reactive to light and accomodation Neck: Supple, no JVD. No cervical lymphadenopathy.  Chest: Good air entry bilaterally, no added sounds  CVS: S1 S2 regular, no murmurs.  Abdomen: Bowel sounds present, Non tender and not distended with no gaurding, rigidity or rebound. Extremities: B/L Lower Ext shows no edema, both legs are  warm to touch Neurology: Awake alert, and oriented X 3, CN II-XII intact, Non focal Skin: No Rash  Discharge Instructions  Discharge Instructions     Call MD for:  persistant nausea and vomiting   Complete by: As directed    Call MD for:  temperature >100.4   Complete by: As directed    Diet - low sodium heart healthy   Complete by: As directed    Increase activity slowly   Complete by: As directed       Allergies as of 01/07/2024       Reactions   Ketorolac  Tromethamine  Swelling, Other (See Comments)   Patient reports facial edema and left arm edema after administration.   Tape Rash, Other (See Comments)   PLEASE DO NOT USE THE CLEAR, THICK, PLASTIC TAPE- only paper tape is tolerated    Wound Dressing Adhesive Rash        Medication List     TAKE these medications    albuterol  108 (90 Base) MCG/ACT inhaler Commonly known as: VENTOLIN  HFA Inhale 2 puffs into the lungs every 6 (six) hours as needed for wheezing or shortness of breath.   cyclobenzaprine  10 MG tablet Commonly known as: FLEXERIL  Take 10 mg by mouth 2 (two) times daily as needed for muscle spasms.   Deferasirox  360 MG Tabs Commonly known as: Jadenu  Take 2 tablets (720 mg total) by mouth daily.   diphenhydrAMINE  25 MG tablet Commonly known as: BENADRYL  Take 1 tablet (25 mg total) by mouth every 4 (four) hours as needed for itching. What changed:  how much to take when to take this   Eliquis  5 MG Tabs tablet Generic drug: apixaban  Take 1 tablet (5 mg total) by mouth 2 (two) times daily.   folic acid  400 MCG tablet Commonly known as: FOLVITE  Take 1 tablet (400 mcg total) by mouth daily.   gabapentin  300 MG capsule Commonly known as: NEURONTIN  Take 1 capsule (300 mg total) by mouth at bedtime as needed (nerve pain).   guaiFENesin  600 MG 12 hr tablet Commonly known as: Mucinex  Take 1 tablet (600 mg total) by mouth 2 (two) times daily.   hydroxyurea  500 MG capsule Commonly known as:  HYDREA  Take 4 capsules (2,000 mg total) by mouth at bedtime.   Narcan  4 MG/0.1ML Liqd nasal spray kit Generic drug: naloxone  Place 1 spray into the nose as needed (respiratory distress / opioid overdose).   oxyCODONE -acetaminophen  10-325 MG tablet Commonly known as: PERCOCET Take 1 tablet by mouth every 4 (four) hours as needed for pain. What changed: when to take this   pantoprazole  40 MG tablet Commonly known as: PROTONIX  Take 1 tablet (40 mg total) by mouth daily as needed (acid reflux).   senna-docusate 8.6-50 MG tablet Commonly known as: Senokot-S Take 2 tablets by mouth at bedtime.   triamcinolone  cream 0.1 % Commonly known as: KENALOG  Apply 1 Application topically 2 (two) times daily.   Triumeq  600-50-300 MG tablet Generic drug: abacavir -dolutegravir -lamiVUDine  Take 1 tablet by mouth daily.  What changed: when to take this   Vitamin D  (Ergocalciferol ) 1.25 MG (50000 UNIT) Caps capsule Commonly known as: DRISDOL  Take 1 capsule (50,000 Units total) by mouth every 7 (seven) days.   Vitamin D  (Ergocalciferol ) 1.25 MG (50000 UNIT) Caps capsule Commonly known as: DRISDOL  Take 1 capsule (50,000 Units total) by mouth every 7 (seven) days.   Vitamin D3 125 MCG (5000 UT) Caps Take 5,000 Units by mouth every Tuesday.        The results of significant diagnostics from this hospitalization (including imaging, microbiology, ancillary and laboratory) are listed below for reference.    Significant Diagnostic Studies: CT Angio Chest PE W and/or Wo Contrast Result Date: 01/04/2024 CLINICAL DATA:  Sickle cell pain. Chest pain. Clinical concern for pulmonary embolus. EXAM: CT ANGIOGRAPHY CHEST WITH CONTRAST TECHNIQUE: Multidetector CT imaging of the chest was performed using the standard protocol during bolus administration of intravenous contrast. Multiplanar CT image reconstructions and MIPs were obtained to evaluate the vascular anatomy. RADIATION DOSE REDUCTION: This exam was  performed according to the departmental dose-optimization program which includes automated exposure control, adjustment of the mA and/or kV according to patient size and/or use of iterative reconstruction technique. CONTRAST:  75mL OMNIPAQUE  IOHEXOL  350 MG/ML SOLN COMPARISON:  10/15/2023 FINDINGS: Cardiovascular: Heart size upper normal. No substantial pericardial effusion. Right Port-A-Cath tip is positioned in the distal SVC near the RA junction. There is no filling defect within the opacified pulmonary arteries to suggest the presence of an acute pulmonary embolus. Enlargement of the pulmonary outflow tract/main pulmonary arteries suggests pulmonary arterial hypertension. Mediastinum/Nodes: Scattered small mediastinal and hilar lymph nodes evident. The esophagus has normal imaging features. Small axillary lymph nodes evident bilaterally. Lungs/Pleura: Interval progression of patchy ground-glass opacity in both lungs with scattered areas of architectural distortion and atelectasis. Interlobular septal thickening noted towards the lung bases. 4 mm left upper lobe nodule on 45/12 is new in the interval. Upper Abdomen: Auto splenectomy. Musculoskeletal: No worrisome lytic or sclerotic osseous abnormality. Review of the MIP images confirms the above findings. IMPRESSION: 1. No CT evidence for acute pulmonary embolus. 2. Interval progression of patchy ground-glass opacity in both lungs with scattered areas of architectural distortion and atelectasis. Findings are basilar predominant with associated interlobular septal thickening. Imaging features are nonspecific but can be seen in the setting of acute chest syndrome. Pulmonary edema or atypical infection would also be considerations. 3. Enlargement of the pulmonary outflow tract/main pulmonary arteries suggests pulmonary arterial hypertension. 4. 4 mm left upper lobe pulmonary nodule, new in the interval. No follow-up needed if patient is low-risk.This recommendation  follows the consensus statement: Guidelines for Management of Incidental Pulmonary Nodules Detected on CT Images: From the Fleischner Society 2017; Radiology 2017; 284:228-243. Electronically Signed   By: Camellia Candle M.D.   On: 01/04/2024 08:14   DG Chest 2 View Result Date: 01/03/2024 CLINICAL DATA:  hypoxia EXAM: CHEST - 2 VIEW COMPARISON:  Chest x-ray 12/15/2023 FINDINGS: Right chest wall Port-A-Cath with tip overlying the distal superior vena cava/region of the superior caval junction. The heart and mediastinal contours are within normal limits. No focal consolidation. Chronic coarsened interstitial markings. No overt pulmonary edema. No pleural effusion. No pneumothorax. No acute osseous abnormality. IMPRESSION: No acute cardiopulmonary disease in a patient with underlying chronic coarsened interstitial markings. Electronically Signed   By: Morgane  Naveau M.D.   On: 01/03/2024 01:48   DG Chest 2 View Result Date: 12/15/2023 CLINICAL DATA:  Sickle cell crisis, dyspnea. EXAM: CHEST - 2  VIEW COMPARISON:  December 11, 2023 FINDINGS: There is stable right-sided venous Port-A-Cath positioning. The heart size and mediastinal contours are within normal limits. Mild, diffuse, chronic appearing increased interstitial lung markings are seen. There is no evidence of focal consolidation, pleural effusion or pneumothorax. No acute osseous abnormality is identified. IMPRESSION: Predominantly stable chronic appearing increased interstitial lung markings. Electronically Signed   By: Suzen Dials M.D.   On: 12/15/2023 01:56   DG Chest Port 1 View Result Date: 12/11/2023 CLINICAL DATA:  Sickle cell crisis with chest pain, initial encounter EXAM: PORTABLE CHEST 1 VIEW COMPARISON:  11/07/2023 FINDINGS: Cardiac shadow is stable. Right chest wall port is noted in satisfactory position. Mild interstitial changes are seen bilaterally similar to that noted on the prior exam. No focal infiltrate or effusion is noted. No  acute bony abnormality is seen. IMPRESSION: Stable interstitial changes bilaterally. No acute abnormality noted. Electronically Signed   By: Oneil Devonshire M.D.   On: 12/11/2023 02:44    Microbiology: No results found for this or any previous visit (from the past 240 hours).   Labs: Basic Metabolic Panel: Recent Labs  Lab 01/03/24 0149 01/04/24 0228  NA 145 143  K 3.5 3.8  CL 113* 111  CO2 19* 21*  GLUCOSE 87 83  BUN 9 11  CREATININE 0.82 0.71  CALCIUM  8.0* 8.0*   Liver Function Tests: Recent Labs  Lab 01/03/24 0149 01/04/24 0228  AST 29 24  ALT 12 12  ALKPHOS 52 57  BILITOT 7.6* 6.9*  PROT 6.8 6.9  ALBUMIN 3.8 3.8   No results for input(s): LIPASE, AMYLASE in the last 168 hours. No results for input(s): AMMONIA in the last 168 hours. CBC: Recent Labs  Lab 01/03/24 0149 01/04/24 0228 01/05/24 0454 01/06/24 0433 01/07/24 0819  WBC 10.2 11.1* 11.1* 11.4* 8.7  NEUTROABS 5.7 3.8  --   --   --   HGB 8.1* 8.7* 9.0* 8.9* 8.1*  HCT 21.9* 24.5* 24.8* 24.3* 22.3*  MCV 106.8* 103.8* 103.8* 100.4* 98.7  PLT 430* 493* 531* 498* 460*   Cardiac Enzymes: No results for input(s): CKTOTAL, CKMB, CKMBINDEX, TROPONINI in the last 168 hours. BNP: Invalid input(s): POCBNP CBG: No results for input(s): GLUCAP in the last 168 hours.  Time coordinating discharge: 50 minutes  Signed:  [name] Cover NP  [facility] 01/07/2024, 10:44 PM

## 2024-01-07 NOTE — Plan of Care (Signed)

## 2024-01-09 ENCOUNTER — Other Ambulatory Visit: Payer: Self-pay

## 2024-01-09 ENCOUNTER — Emergency Department (HOSPITAL_COMMUNITY)
Admission: EM | Admit: 2024-01-09 | Discharge: 2024-01-09 | Disposition: A | Discharge status: Home / Self Care | Payer: Self-pay | Attending: Emergency Medicine | Admitting: Emergency Medicine

## 2024-01-09 ENCOUNTER — Encounter (HOSPITAL_COMMUNITY): Payer: Self-pay

## 2024-01-09 DIAGNOSIS — Z21 Asymptomatic human immunodeficiency virus [HIV] infection status: Secondary | ICD-10-CM | POA: Insufficient documentation

## 2024-01-09 DIAGNOSIS — D5700 Hb-SS disease with crisis, unspecified: Secondary | ICD-10-CM | POA: Insufficient documentation

## 2024-01-09 DIAGNOSIS — Z7901 Long term (current) use of anticoagulants: Secondary | ICD-10-CM | POA: Insufficient documentation

## 2024-01-09 LAB — BASIC METABOLIC PANEL WITH GFR
Anion gap: 10 (ref 5–15)
BUN: 21 mg/dL — ABNORMAL HIGH (ref 6–20)
CO2: 21 mmol/L — ABNORMAL LOW (ref 22–32)
Calcium: 8.3 mg/dL — ABNORMAL LOW (ref 8.9–10.3)
Chloride: 109 mmol/L (ref 98–111)
Creatinine, Ser: 1.01 mg/dL (ref 0.61–1.24)
GFR, Estimated: >60 mL/min (ref >60)
Glucose, Bld: 99 mg/dL (ref 70–99)
Potassium: 4.4 mmol/L (ref 3.5–5.1)
Sodium: 140 mmol/L (ref 135–145)

## 2024-01-09 LAB — CBC
HCT: 21.9 % — ABNORMAL LOW (ref 39.0–52.0)
Hemoglobin: 8.1 g/dL — ABNORMAL LOW (ref 13.0–17.0)
MCH: 36.7 pg — ABNORMAL HIGH (ref 26.0–34.0)
MCHC: 37 g/dL — ABNORMAL HIGH (ref 30.0–36.0)
MCV: 99.1 fL (ref 80.0–100.0)
Platelets: 443 K/uL — ABNORMAL HIGH (ref 150–400)
RBC: 2.21 MIL/uL — ABNORMAL LOW (ref 4.22–5.81)
RDW: 26.3 % — ABNORMAL HIGH (ref 11.5–15.5)
WBC: 10.1 K/uL (ref 4.0–10.5)
nRBC: 12.2 % — ABNORMAL HIGH (ref 0.0–0.2)

## 2024-01-09 LAB — RETICULOCYTES
Immature Retic Fract: 17.4 % — ABNORMAL HIGH (ref 2.3–15.9)
RBC.: 2.07 MIL/uL — ABNORMAL LOW (ref 4.22–5.81)
Retic Count, Absolute: 307.2 K/uL — ABNORMAL HIGH (ref 19.0–186.0)
Retic Ct Pct: 14.8 % — ABNORMAL HIGH (ref 0.4–3.1)

## 2024-01-09 MED ORDER — SODIUM CHLORIDE 0.9% FLUSH
10.0000 mL | INTRAVENOUS | Status: DC | PRN
  Administered 2024-01-09: 10 mL

## 2024-01-09 MED ORDER — HEPARIN SOD (PORK) LOCK FLUSH 100 UNIT/ML IV SOLN
500.0000 [IU] | INTRAVENOUS | Status: AC | PRN
  Administered 2024-01-09: 500 [IU]

## 2024-01-09 MED ORDER — SODIUM CHLORIDE 0.9 % IV BOLUS
1000.0000 mL | Freq: Once | INTRAVENOUS | Status: AC
  Administered 2024-01-09: 1000 mL via INTRAVENOUS

## 2024-01-09 MED ORDER — SODIUM CHLORIDE 0.9% FLUSH: 10.0000 mL | Freq: Two times a day (BID) | INTRAVENOUS | Status: DC

## 2024-01-09 MED ORDER — HYDROMORPHONE HCL 1 MG/ML IJ SOLN
2.0000 mg | Freq: Once | INTRAMUSCULAR | Status: AC
  Administered 2024-01-09: 2 mg via INTRAVENOUS
  Filled 2024-01-09: qty 2

## 2024-01-09 MED ORDER — HEPARIN SOD (PORK) LOCK FLUSH 100 UNIT/ML IV SOLN: 500.0000 [IU] | Freq: Once | INTRAVENOUS | Status: DC

## 2024-01-09 MED ORDER — ACETAMINOPHEN 500 MG PO TABS
1000.0000 mg | ORAL_TABLET | Freq: Once | ORAL | Status: AC
  Administered 2024-01-09: 1000 mg via ORAL
  Filled 2024-01-09: qty 2

## 2024-01-09 MED ORDER — OXYCODONE-ACETAMINOPHEN 5-325 MG PO TABS
2.0000 | ORAL_TABLET | Freq: Once | ORAL | Status: AC
  Administered 2024-01-09: 2 via ORAL
  Filled 2024-01-09: qty 2

## 2024-01-09 MED ORDER — CHLORHEXIDINE GLUCONATE CLOTH 2 % EX PADS: 6.0000 | MEDICATED_PAD | Freq: Every day | CUTANEOUS | Status: DC

## 2024-01-09 MED ORDER — DIPHENHYDRAMINE HCL 50 MG/ML IJ SOLN
25.0000 mg | Freq: Once | INTRAMUSCULAR | Status: AC
  Administered 2024-01-09: 25 mg via INTRAVENOUS
  Filled 2024-01-09: qty 1

## 2024-01-09 NOTE — ED Notes (Signed)
 Patient requested for port to be accessed for lab work and medication administration. Appropriate orders will be placed.

## 2024-01-09 NOTE — ED Provider Notes (Signed)
 Patient's care assumed at 6:30 AM.  Patient has a past medical history of sickle cell presenting to the emergency department with sickle cell crisis.  Patient is known to have frequent crisis's.  Patient has had laboratory evaluation and is pending pain management.  Patient has been given IV pain medicines.  He reports he feels much better and wants to go home.  Patient is advised to follow-up with the sickle cell clinic return to the emergency department if any problems.   Flint Sonny POUR, PA-C 01/09/24 1041    Levander Houston, MD 01/10/24 1200

## 2024-01-09 NOTE — ED Provider Notes (Signed)
 [place] EMERGENCY DEPARTMENT AT [facility] Provider Note   CSN: [id number] Arrival date & time: 01/09/24  0446     Patient presents with: Sickle Cell Pain Crisis   Martin [name] is a 33 y.o. male with history of HIV, sickle cell disease, GAD.  Patient presents to ED for evaluation of sickle cell pain crisis.  States that for the last 1 day he has had pain throughout his entire body consistent with past episodes of sickle cell pain crisis.  Reports has been taking his at home hydroxyurea , oxycodone  10 mg without relief.  He denies fevers, nausea or vomiting.  He denies chest pain or shortness of breath.  He arrives hypoxic however patient does wear home oxygen  3 L and was not wearing this.  Patient was placed on 3 L of home oxygen  and oxygen  saturation improved.  He denies any fevers, nausea or vomiting at home.   Sickle Cell Pain Crisis      Prior to Admission medications   Medication Sig Start Date End Date Taking? Authorizing Provider  abacavir -dolutegravir -lamiVUDine  (TRIUMEQ ) 600-50-300 MG tablet Take 1 tablet by mouth daily. Patient taking differently: Take 1 tablet by mouth at bedtime. 08/13/23   Pearlean Manus, MD  albuterol  (VENTOLIN  HFA) 108 (90 Base) MCG/ACT inhaler Inhale 2 puffs into the lungs every 6 (six) hours as needed for wheezing or shortness of breath. Patient not taking: Reported on 10/20/2023 08/13/23   Pearlean Manus, MD  apixaban  (ELIQUIS ) 5 MG TABS tablet Take 1 tablet (5 mg total) by mouth 2 (two) times daily. 08/13/23   Pearlean Manus, MD  Cholecalciferol  (VITAMIN D3) 125 MCG (5000 UT) CAPS Take 5,000 Units by mouth every Tuesday.    [provider]  cyclobenzaprine  (FLEXERIL ) 10 MG tablet Take 10 mg by mouth 2 (two) times daily as needed for muscle spasms. 12/22/22   [provider]  Deferasirox  (JADENU ) 360 MG TABS Take 2 tablets (720 mg total) by mouth daily. Patient not taking: Reported on 01/04/2024 11/05/23   [name],  [name], [name]  diphenhydrAMINE  (BENADRYL ) 25 MG tablet Take 1 tablet (25 mg total) by mouth every 4 (four) hours as needed for itching. Patient taking differently: Take 50 mg by mouth daily as needed for itching. 08/13/23   Pearlean Manus, MD  folic acid  (FOLVITE ) 400 MCG tablet Take 1 tablet (400 mcg total) by mouth daily. 08/13/23   Pearlean Manus, MD  gabapentin  (NEURONTIN ) 300 MG capsule Take 1 capsule (300 mg total) by mouth at bedtime as needed (nerve pain). Patient not taking: Reported on 01/04/2024 08/13/23   Pearlean Manus, MD  guaiFENesin  (MUCINEX ) 600 MG 12 hr tablet Take 1 tablet (600 mg total) by mouth 2 (two) times daily. Patient not taking: Reported on 01/04/2024 08/13/23 08/12/24  Pearlean Manus, MD  hydroxyurea  (HYDREA ) 500 MG capsule Take 4 capsules (2,000 mg total) by mouth at bedtime. 08/13/23   Pearlean Manus, MD  NARCAN  4 MG/0.1ML LIQD nasal spray kit Place 1 spray into the nose as needed (respiratory distress / opioid overdose). 01/07/23   [provider]  oxyCODONE -acetaminophen  (PERCOCET) 10-325 MG tablet Take 1 tablet by mouth every 4 (four) hours as needed for pain. Patient taking differently: Take 1 tablet by mouth every 4 (four) hours. 01/01/24   [name], [name], [name]  pantoprazole  (PROTONIX ) 40 MG tablet Take 1 tablet (40 mg total) by mouth daily as needed (acid reflux). Patient not taking: Reported on 01/04/2024 08/13/23   Pearlean Manus, MD  senna-docusate (  SENOKOT-S) 8.6-50 MG tablet Take 2 tablets by mouth at bedtime. Patient not taking: Reported on 01/04/2024 08/13/23   Pearlean Manus, MD  triamcinolone  cream (KENALOG ) 0.1 % Apply 1 Application topically 2 (two) times daily. Patient not taking: Reported on 01/04/2024 01/12/23   Tilford Bertram HERO, [name]  Vitamin D , Ergocalciferol , (DRISDOL ) 1.25 MG (50000 UNIT) CAPS capsule Take 1 capsule (50,000 Units total) by mouth every 7 (seven) days. Patient not taking: Reported on 01/04/2024 11/05/23   [name],  [name], [name]  Vitamin D , Ergocalciferol , (DRISDOL ) 1.25 MG (50000 UNIT) CAPS capsule Take 1 capsule (50,000 Units total) by mouth every 7 (seven) days. Patient not taking: Reported on 01/04/2024 11/09/23   [name], [name], [name]    Allergies: Ketorolac  tromethamine , Tape, and Wound dressing adhesive    Review of Systems  Musculoskeletal:  Positive for myalgias.  All other systems reviewed and are negative.   Updated Vital Signs BP 92/74   Pulse 97   Temp 98.4 F (36.9 C)   Resp 18   Ht 6' 3 (1.905 m)   Wt 63.5 kg   SpO2 92%   BMI 17.50 kg/m   Physical Exam Vitals and nursing note reviewed.  Constitutional:      General: He is not in acute distress.    Appearance: He is well-developed.  HENT:     Head: Normocephalic and atraumatic.  Eyes:     Conjunctiva/sclera: Conjunctivae normal.  Cardiovascular:     Rate and Rhythm: Normal rate and regular rhythm.     Heart sounds: No murmur heard. Pulmonary:     Effort: Pulmonary effort is normal. No respiratory distress.     Breath sounds: Normal breath sounds.  Abdominal:     Palpations: Abdomen is soft.     Tenderness: There is no abdominal tenderness.  Musculoskeletal:        General: No swelling.     Cervical back: Neck supple.  Skin:    General: Skin is warm and dry.     Capillary Refill: Capillary refill takes less than 2 seconds.  Neurological:     Mental Status: He is alert.  Psychiatric:        Mood and Affect: Mood normal.     (all labs ordered are listed, but only abnormal results are displayed) Labs Reviewed  CBC  BASIC METABOLIC PANEL WITH GFR  RETICULOCYTES    EKG: None  Radiology: No results found.  Procedures   Medications Ordered in the ED  HYDROmorphone  (DILAUDID ) injection 2 mg (has no administration in time range)  diphenhydrAMINE  (BENADRYL ) injection 25 mg (has no administration in time range)  sodium chloride  0.9 % bolus 1,000 mL (has no administration in time range)  sodium  chloride flush (NS) 0.9 % injection 10-40 mL (has no administration in time range)  sodium chloride  flush (NS) 0.9 % injection 10-40 mL (has no administration in time range)  Chlorhexidine  Gluconate Cloth 2 % PADS 6 each (has no administration in time range)  oxyCODONE -acetaminophen  (PERCOCET/ROXICET) 5-325 MG per tablet 2 tablet (2 tablets Oral Given 01/09/24 0508)  acetaminophen  (TYLENOL ) tablet 1,000 mg (1,000 mg Oral Given 01/09/24 0508)    Medical Decision Making Amount and/or Complexity of Data Reviewed Labs: ordered.  Risk OTC drugs. Prescription drug management.   This is a 33 year old male presenting to the ED with sickle cell pain crisis.  On exam, the patient is afebrile and nontachycardic.  His lung sounds are clear bilaterally, he is hypoxic on room air but when  transition to his home oxygen , his oxygen  saturation improves and maintains at 95%.  Abdomen soft and compressible.  Neurological examination at baseline.  Overall nontoxic in appearance.  Labs collected to include CBC, BMP, reticulocytes.  Patient treatment began with 2 mg Dilaudid , 25 mg of Benadryl , home oxycodone  10 mg, Tylenol .  Unfortunately, patient port is unable to be accessed and IV team consult has been placed.  Patient workup not complete.  Signed out to oncoming provider [facility].  Plan of management has been discussed.    Final diagnoses:  Sickle cell pain crisis [facility])    ED Discharge Orders     None          Ruthell Lonni JULIANNA DEVONNA 01/09/24 0710    Mesner, Selinda, MD 01/09/24 [phone]

## 2024-01-09 NOTE — ED Triage Notes (Addendum)
 Pt arrived POV from home c/o a sickle cell pain crisis and states he hurts all over and it started last night. Pt's O2 is low and states he wears 3L PRN at home. Pt placed on 3L.

## 2024-01-09 NOTE — Discharge Instructions (Signed)
 Follow-up at the sickle cell clinic as needed for recheck.

## 2024-01-10 ENCOUNTER — Telehealth: Payer: Self-pay

## 2024-01-10 NOTE — Transitions of Care (Post Inpatient/ED Visit) (Signed)
   01/10/2024  Name: Martin [name] MRN: [id number] DOB: March 07, 1991  Today's TOC FU Call Status: Today's TOC FU Call Status:: Unsuccessful Call (1st Attempt) Unsuccessful Call (1st Attempt) Date: 01/10/24  Attempted to reach the patient regarding the most recent Inpatient/ED visit.  Follow Up Plan: Additional outreach attempts will be made to reach the patient to complete the Transitions of Care (Post Inpatient/ED visit) call.   Arvin Seip RN, BSN, CCM [employer], Population Health Case Manager Phone: [phone]

## 2024-01-11 ENCOUNTER — Telehealth: Payer: Self-pay

## 2024-01-11 NOTE — Patient Instructions (Signed)
 Visit Information  Thank you for taking time to visit with me today.   Please follow up with physicians as scheduled, stay hydrated, take pain medications, and remain active.   - practice acceptance of chronic pain - track what makes the pain worse and what makes it better - use relaxation during pain  Continue utilizing [address] as a resource for housing.  Patient verbalizes understanding of instructions and care plan provided today and agrees to view in MyChart. Active MyChart status and patient understanding of how to access instructions and care plan via MyChart confirmed with patient.     The patient has been provided with contact information for the care management team and has been advised to call with any health related questions or concerns.   Please call the care guide team at [phone] if you need to cancel or reschedule your appointment.   Please call the Suicide and Crisis Lifeline: 988 call the [place]  National Suicide Prevention Lifeline: 272-606-5084 or TTY: (639)378-6641 TTY 7050830050) to talk to a trained counselor call 1-800-273-TALK (toll free, 24 hour hotline) if you are experiencing a Mental Health or Behavioral Health Crisis or need someone to talk to.  Leighton Brickley J. Gaige Sebo RN, [name] [facility], [facility] RN Care Manager Direct Dial: [phone]  Fax: [phone] Website: [email or web address]

## 2024-01-11 NOTE — Transitions of Care (Post Inpatient/ED Visit) (Signed)
 01/11/2024  Name: Martin [name] MRN: [id number] DOB: June 15, 1990  Today's TOC FU Call Status: Today's TOC FU Call Status:: Successful TOC FU Call Completed TOC FU Call Complete Date: 01/11/24 Patient's Name and Date of Birth confirmed.  Transition Care Management Follow-up Telephone Call Date of Discharge: 01/07/24 Discharge Facility: [facility]) Type of Discharge: Inpatient Admission Primary Inpatient Discharge Diagnosis:: generalized anxiety disorder, SSC How have you been since you were released from the hospital?: Better Any questions or concerns?: Yes Patient Questions/Concerns:: Patient is currently homeless.  He stays with friends presently.  He is going through 211 to find resources for housing in [place].  Advised patient to also search housing resouces immediately outside of [place]. Patient Questions/Concerns Addressed: Other: (Advised to search outisde of [place] for hoousing resources with 211.)  Items Reviewed: Did you receive and understand the discharge instructions provided?: Yes Medications obtained,verified, and reconciled?: Yes (Medications Reviewed) Any new allergies since your discharge?: No Dietary orders reviewed?: Yes Type of Diet Ordered:: Heart Healthy Do you have support at home?: Yes People in Home [RPT]: sibling(s)  Medications Reviewed Today: Medications Reviewed Today     Reviewed by Tiffony Kite, RN (Case Manager) on 01/11/24 at 1440  Med List Status: <None>   Medication Order Taking? Sig Documenting Provider Last Dose Status Informant  abacavir -dolutegravir -lamiVUDine  (TRIUMEQ ) 600-50-300 MG tablet [id number] Yes Take 1 tablet by mouth daily.  Patient taking differently: Take 1 tablet by mouth at bedtime.   Pearlean Manus, MD  Active Self           Med Note MARISA, NATHANEL SAILOR   Tue Jan 04, 2024  5:04 PM) The patient he stated he is currently taking this, but I did not see a recent fill of it  albuterol  (VENTOLIN  HFA) 108 (90  Base) MCG/ACT inhaler [id number]  Inhale 2 puffs into the lungs every 6 (six) hours as needed for wheezing or shortness of breath.  Patient not taking: Reported on 01/11/2024   Pearlean Manus, MD  Active Self  apixaban  (ELIQUIS ) 5 MG TABS tablet [id number] Yes Take 1 tablet (5 mg total) by mouth 2 (two) times daily. Pearlean Manus, MD  Active Self           Med Note MARISA, NATHANEL SAILOR   Tue Jan 04, 2024  5:04 PM) The patient he stated he is currently taking this, but I did not see a recent fill of it  Cholecalciferol  (VITAMIN D3) 125 MCG (5000 UT) CAPS [id number] Yes Take 5,000 Units by mouth every Tuesday. [provider]  Active Self  cyclobenzaprine  (FLEXERIL ) 10 MG tablet [id number] Yes Take 10 mg by mouth 2 (two) times daily as needed for muscle spasms. [provider]  Active   Deferasirox  (JADENU ) 360 MG TABS [id number]  Take 2 tablets (720 mg total) by mouth daily.  Patient not taking: Reported on 01/11/2024   [name], [name], [name]  Active Self  diphenhydrAMINE  (BENADRYL ) 25 MG tablet [id number] Yes Take 1 tablet (25 mg total) by mouth every 4 (four) hours as needed for itching.  Patient taking differently: Take 50 mg by mouth daily as needed for itching.   Pearlean Manus, MD  Active Self  folic acid  (FOLVITE ) 400 MCG tablet [id number] Yes Take 1 tablet (400 mcg total) by mouth daily. Pearlean Manus, MD  Active Self           Med Note MARISA, NATHANEL SAILOR   Tue Jan 04, 2024  4:46 PM)    gabapentin  (  NEURONTIN ) 300 MG capsule [id number]  Take 1 capsule (300 mg total) by mouth at bedtime as needed (nerve pain).  Patient not taking: Reported on 01/11/2024   Pearlean Manus, MD  Active Self           Med Note MARISA, NATHANEL SAILOR   Tue Jan 04, 2024  4:45 PM)    guaiFENesin  (MUCINEX ) 600 MG 12 hr tablet [id number]  Take 1 tablet (600 mg total) by mouth 2 (two) times daily.  Patient not taking: Reported on 01/11/2024   Pearlean Manus, MD  Active Self  hydroxyurea  (HYDREA ) 500 MG capsule  [id number] Yes Take 4 capsules (2,000 mg total) by mouth at bedtime. Pearlean Manus, MD  Active Self           Med Note MARISA, NATHANEL SAILOR   Tue Jan 04, 2024  5:06 PM) The patient he stated he is currently taking this, but I did not see a recent fill of it  NARCAN  4 MG/0.1ML LIQD nasal spray kit [id number] Yes Place 1 spray into the nose as needed (respiratory distress / opioid overdose). [provider]  Active Self           Med Note MARISA, NATHANEL SAILOR   Tue Aug 10, 2023  2:21 PM)    oxyCODONE -acetaminophen  (PERCOCET) 10-325 MG tablet [id number] Yes Take 1 tablet by mouth every 4 (four) hours as needed for pain.  Patient taking differently: Take 1 tablet by mouth every 4 (four) hours.   [name], [name], [name]  Active Self  pantoprazole  (PROTONIX ) 40 MG tablet [id number]  Take 1 tablet (40 mg total) by mouth daily as needed (acid reflux).  Patient not taking: Reported on 01/11/2024   Pearlean Manus, MD  Active Self  senna-docusate (SENOKOT-S) 8.6-50 MG tablet [id number]  Take 2 tablets by mouth at bedtime.  Patient not taking: Reported on 01/11/2024   Pearlean Manus, MD  Active Self  triamcinolone  cream (KENALOG ) 0.1 % [id number]  Apply 1 Application topically 2 (two) times daily.  Patient not taking: Reported on 01/11/2024   Tilford Bertram HERO, [name]  Active Self           Med Note MARISA, NATHANEL SAILOR   Tue Aug 10, 2023  2:21 PM)    Vitamin D , Ergocalciferol , (DRISDOL ) 1.25 MG (50000 UNIT) CAPS capsule [id number] Yes Take 1 capsule (50,000 Units total) by mouth every 7 (seven) days. [name], [name], [name]  Active Self           Med Note MARISA, KATHY N   Tue Jan 04, 2024  4:47 PM)    Vitamin D , Ergocalciferol , (DRISDOL ) 1.25 MG (50000 UNIT) CAPS capsule [id number]  Take 1 capsule (50,000 Units total) by mouth every 7 (seven) days.  Patient not taking: Reported on 01/11/2024   [name], [name], [name]  Active Self           Med Note MARISA, NATHANEL SAILOR   Tue Jan 04, 2024  4:47 PM)               Home Care and Equipment/Supplies: Were Home Health Services Ordered?: No Any new equipment or medical supplies ordered?: No  Functional Questionnaire: Do you need assistance with bathing/showering or dressing?: No Do you need assistance with meal preparation?: No Do you need assistance with eating?: No Do you have difficulty maintaining continence: No Do you need assistance with getting out of bed/getting out of a chair/moving?: No Do you have difficulty managing or taking your medications?:  No  Follow up appointments reviewed: PCP Follow-up appointment confirmed?: Yes Date of PCP follow-up appointment?: 01/05/24 Follow-up Provider: Dr. Juanice Driscilla Salvage Follow-up appointment confirmed?: NA Do you need transportation to your follow-up appointment?: No Do you understand care options if your condition(s) worsen?: Yes-patient verbalized understanding (Discussed importance for patient to follow up with physicians as scheduled, stay hydrated, take pain medications, and remain active practice acceptance of chronic pain - track what makes the pain worse and what makes it better  use relaxation during pain)  SDOH Interventions Today    Flowsheet Row Most Recent Value  SDOH Interventions   Food Insecurity Interventions Intervention Not Indicated  Housing Interventions Community Resources Provided  [Patient going through 211 for resources for housing.  Advised to try housing options immediately outside of [place] as well.]  Transportation Interventions Intervention Not Indicated  Utilities Interventions Intervention Not Indicated    [place] Kinyon J. Kamilia Carollo RN, [name] [facility]  [facility], [facility] RN Care Manager Direct Dial: [phone]  Fax: [phone] Website: [email or web address]

## 2024-01-12 ENCOUNTER — Emergency Department (HOSPITAL_COMMUNITY)
Admission: EM | Admit: 2024-01-12 | Discharge: 2024-01-12 | Disposition: A | Discharge status: Home / Self Care | Payer: Self-pay | Attending: Emergency Medicine | Admitting: Emergency Medicine

## 2024-01-12 ENCOUNTER — Other Ambulatory Visit: Payer: Self-pay

## 2024-01-12 DIAGNOSIS — Z7901 Long term (current) use of anticoagulants: Secondary | ICD-10-CM | POA: Insufficient documentation

## 2024-01-12 DIAGNOSIS — D5700 Hb-SS disease with crisis, unspecified: Secondary | ICD-10-CM | POA: Insufficient documentation

## 2024-01-12 DIAGNOSIS — G894 Chronic pain syndrome: Secondary | ICD-10-CM | POA: Insufficient documentation

## 2024-01-12 LAB — CBC WITH DIFFERENTIAL/PLATELET
Abs Immature Granulocytes: 0.15 K/uL — ABNORMAL HIGH (ref 0.00–0.07)
Basophils Absolute: 0.1 K/uL (ref 0.0–0.1)
Basophils Relative: 1 %
Eosinophils Absolute: 0.1 K/uL (ref 0.0–0.5)
Eosinophils Relative: 1 %
HCT: 23.7 % — ABNORMAL LOW (ref 39.0–52.0)
Hemoglobin: 8.4 g/dL — ABNORMAL LOW (ref 13.0–17.0)
Immature Granulocytes: 2 %
Lymphocytes Relative: 45 %
Lymphs Abs: 4.6 K/uL — ABNORMAL HIGH (ref 0.7–4.0)
MCH: 37.2 pg — ABNORMAL HIGH (ref 26.0–34.0)
MCHC: 35.4 g/dL (ref 30.0–36.0)
MCV: 104.9 fL — ABNORMAL HIGH (ref 80.0–100.0)
Monocytes Absolute: 1.4 K/uL — ABNORMAL HIGH (ref 0.1–1.0)
Monocytes Relative: 14 %
Neutro Abs: 3.7 K/uL (ref 1.7–7.7)
Neutrophils Relative %: 37 %
Platelets: 361 K/uL (ref 150–400)
RBC: 2.26 MIL/uL — ABNORMAL LOW (ref 4.22–5.81)
RDW: 29 % — ABNORMAL HIGH (ref 11.5–15.5)
Smear Review: NORMAL
WBC: 10 K/uL (ref 4.0–10.5)
nRBC: 37.6 % — ABNORMAL HIGH (ref 0.0–0.2)

## 2024-01-12 LAB — BASIC METABOLIC PANEL WITH GFR
Anion gap: 11 (ref 5–15)
BUN: 14 mg/dL (ref 6–20)
CO2: 20 mmol/L — ABNORMAL LOW (ref 22–32)
Calcium: 9.2 mg/dL (ref 8.9–10.3)
Chloride: 109 mmol/L (ref 98–111)
Creatinine, Ser: 0.83 mg/dL (ref 0.61–1.24)
GFR, Estimated: >60 mL/min (ref >60)
Glucose, Bld: 95 mg/dL (ref 70–99)
Potassium: 3.7 mmol/L (ref 3.5–5.1)
Sodium: 140 mmol/L (ref 135–145)

## 2024-01-12 LAB — RETICULOCYTES: RBC.: 2.27 MIL/uL — ABNORMAL LOW (ref 4.22–5.81)

## 2024-01-12 MED ORDER — HEPARIN SOD (PORK) LOCK FLUSH 100 UNIT/ML IV SOLN
500.0000 [IU] | Freq: Once | INTRAVENOUS | Status: AC
  Administered 2024-01-12: 500 [IU]
  Filled 2024-01-12: qty 5

## 2024-01-12 MED ORDER — DIPHENHYDRAMINE HCL 25 MG PO CAPS
25.0000 mg | ORAL_CAPSULE | ORAL | Status: DC | PRN
  Filled 2024-01-12: qty 2

## 2024-01-12 MED ORDER — OXYCODONE-ACETAMINOPHEN 5-325 MG PO TABS
2.0000 | ORAL_TABLET | Freq: Once | ORAL | Status: AC
  Administered 2024-01-12: 2 via ORAL
  Filled 2024-01-12: qty 2

## 2024-01-12 MED ORDER — ONDANSETRON HCL 4 MG/2ML IJ SOLN
4.0000 mg | INTRAMUSCULAR | Status: DC | PRN
  Administered 2024-01-12: 4 mg via INTRAVENOUS
  Filled 2024-01-12: qty 2

## 2024-01-12 MED ORDER — SODIUM CHLORIDE 0.45 % IV SOLN
INTRAVENOUS | Status: DC
Start: 2024-01-12 — End: 2024-01-12

## 2024-01-12 MED ORDER — HYDROMORPHONE HCL 1 MG/ML IJ SOLN
2.0000 mg | INTRAMUSCULAR | Status: AC
  Administered 2024-01-12: 2 mg via INTRAVENOUS
  Filled 2024-01-12: qty 2

## 2024-01-12 NOTE — Discharge Instructions (Signed)
 1.  Take all of your regularly prescribed medications. 2.  Follow-up with your sickle cell provider as soon as possible.

## 2024-01-12 NOTE — ED Provider Notes (Signed)
 [place] EMERGENCY DEPARTMENT AT [facility] Provider Note   CSN: [id number] Arrival date & time: 01/12/24  9351     Patient presents with: Sickle Cell Pain Crisis   Martin [name] is a 33 y.o. male.   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  Pain is generalized throughout the body.  Patient denies any fever or chills.  Denies cough or shortness of breath.  Denies lower extremity swelling or calf pain.  Patient reports taking oxycodone  at home.  Not getting relief of pain.  10 mg taken at 5 AM.  Patient reports that they have taken their Eliquis  consistently.  Last dose this morning.  Patient has history of PE.  Patient reports sickle cell today clinic is closed today due to staffing issues and thus patient came to the emergency department for treatment for sickle cell pain exacerbation.    Prior to Admission medications   Medication Sig Start Date End Date Taking? Authorizing Provider  abacavir -dolutegravir -lamiVUDine  (TRIUMEQ ) 600-50-300 MG tablet Take 1 tablet by mouth daily. Patient taking differently: Take 1 tablet by mouth at bedtime. 08/13/23   Pearlean Manus, MD  albuterol  (VENTOLIN  HFA) 108 (90 Base) MCG/ACT inhaler Inhale 2 puffs into the lungs every 6 (six) hours as needed for wheezing or shortness of breath. Patient not taking: Reported on 01/11/2024 08/13/23   Pearlean Manus, MD  apixaban  (ELIQUIS ) 5 MG TABS tablet Take 1 tablet (5 mg total) by mouth 2 (two) times daily. 08/13/23   Pearlean Manus, MD  Cholecalciferol  (VITAMIN D3) 125 MCG (5000 UT) CAPS Take 5,000 Units by mouth every Tuesday.    [provider]  cyclobenzaprine  (FLEXERIL ) 10 MG tablet Take 10 mg by mouth 2 (two) times daily as needed for muscle spasms. 12/22/22   [provider]  Deferasirox  (JADENU ) 360 MG TABS Take 2 tablets (720 mg total) by mouth daily. Patient not taking: Reported on 01/11/2024 11/05/23   [name], [name], [name]   diphenhydrAMINE  (BENADRYL ) 25 MG tablet Take 1 tablet (25 mg total) by mouth every 4 (four) hours as needed for itching. Patient taking differently: Take 50 mg by mouth daily as needed for itching. 08/13/23   Pearlean Manus, MD  folic acid  (FOLVITE ) 400 MCG tablet Take 1 tablet (400 mcg total) by mouth daily. 08/13/23   Pearlean Manus, MD  gabapentin  (NEURONTIN ) 300 MG capsule Take 1 capsule (300 mg total) by mouth at bedtime as needed (nerve pain). Patient not taking: Reported on 01/11/2024 08/13/23   Pearlean Manus, MD  guaiFENesin  (MUCINEX ) 600 MG 12 hr tablet Take 1 tablet (600 mg total) by mouth 2 (two) times daily. Patient not taking: Reported on 01/11/2024 08/13/23 08/12/24  Pearlean Manus, MD  hydroxyurea  (HYDREA ) 500 MG capsule Take 4 capsules (2,000 mg total) by mouth at bedtime. 08/13/23   Pearlean Manus, MD  NARCAN  4 MG/0.1ML LIQD nasal spray kit Place 1 spray into the nose as needed (respiratory distress / opioid overdose). 01/07/23   [provider]  oxyCODONE -acetaminophen  (PERCOCET) 10-325 MG tablet Take 1 tablet by mouth every 4 (four) hours as needed for pain. Patient taking differently: Take 1 tablet by mouth every 4 (four) hours. 01/01/24   [name], [name], [name]  pantoprazole  (PROTONIX ) 40 MG tablet Take 1 tablet (40 mg total) by mouth daily as needed (acid reflux). Patient not taking: Reported on 01/11/2024 08/13/23   Pearlean Manus, MD  senna-docusate (SENOKOT-S) 8.6-50 MG tablet Take 2 tablets by mouth at bedtime. Patient not  taking: Reported on 01/11/2024 08/13/23   Pearlean Manus, MD  triamcinolone  cream (KENALOG ) 0.1 % Apply 1 Application topically 2 (two) times daily. Patient not taking: Reported on 01/11/2024 01/12/23   Tilford Bertram HERO, [name]  Vitamin D , Ergocalciferol , (DRISDOL ) 1.25 MG (50000 UNIT) CAPS capsule Take 1 capsule (50,000 Units total) by mouth every 7 (seven) days. 11/05/23   [name], [name], [name]  Vitamin D , Ergocalciferol , (DRISDOL ) 1.25 MG  (50000 UNIT) CAPS capsule Take 1 capsule (50,000 Units total) by mouth every 7 (seven) days. Patient not taking: Reported on 01/11/2024 11/09/23   [name], [name], [name]    Allergies: Ketorolac  tromethamine , Tape, and Wound dressing adhesive    Review of Systems  Updated Vital Signs BP 105/67 (BP Location: Right Arm)   Pulse 68   Temp 98 F (36.7 C) (Oral)   Resp 14   SpO2 98%   Physical Exam Constitutional:      Comments: Alert nontoxic no respiratory distress.  Uncomfortable in appearance.  HENT:     Mouth/Throat:     Pharynx: Oropharynx is clear.  Cardiovascular:     Rate and Rhythm: Normal rate and regular rhythm.  Pulmonary:     Effort: Pulmonary effort is normal.     Breath sounds: Normal breath sounds.  Abdominal:     General: There is no distension.     Palpations: Abdomen is soft.     Tenderness: There is no abdominal tenderness. There is no guarding.  Musculoskeletal:        General: No swelling or tenderness. Normal range of motion.     Right lower leg: No edema.     Left lower leg: No edema.  Skin:    General: Skin is warm and dry.  Neurological:     General: No focal deficit present.     Mental Status: He is oriented to person, place, and time.     Coordination: Coordination normal.     (all labs ordered are listed, but only abnormal results are displayed) Labs Reviewed  CBC WITH DIFFERENTIAL/PLATELET - Abnormal; Notable for the following components:      Result Value   RBC 2.26 (*)    Hemoglobin 8.4 (*)    HCT 23.7 (*)    MCV 104.9 (*)    MCH 37.2 (*)    RDW 29.0 (*)    nRBC 37.6 (*)    Lymphs Abs 4.6 (*)    Monocytes Absolute 1.4 (*)    Abs Immature Granulocytes 0.15 (*)    All other components within normal limits  BASIC METABOLIC PANEL WITH GFR - Abnormal; Notable for the following components:   CO2 20 (*)    All other components within normal limits  RETICULOCYTES - Abnormal; Notable for the following components:   RBC. 2.27 (*)    All  other components within normal limits    EKG: None  Radiology: No results found.   Procedures   Medications Ordered in the ED  0.45 % sodium chloride  infusion ( Intravenous New Bag/Given 01/12/24 0747)  diphenhydrAMINE  (BENADRYL ) capsule 25-50 mg (has no administration in time range)  ondansetron  (ZOFRAN ) injection 4 mg (4 mg Intravenous Given 01/12/24 0741)  oxyCODONE -acetaminophen  (PERCOCET/ROXICET) 5-325 MG per tablet 2 tablet (has no administration in time range)  HYDROmorphone  (DILAUDID ) injection 2 mg (2 mg Intravenous Given 01/12/24 0742)  HYDROmorphone  (DILAUDID ) injection 2 mg (2 mg Intravenous Given 01/12/24 0844)  HYDROmorphone  (DILAUDID ) injection 2 mg (2 mg Intravenous Given 01/12/24 1003)  Medical Decision Making Risk Prescription drug management.   Patient presents as outlined with generalized pain.  Patient reports this is consistent with sickle cell pain.  Patient denies fever, chest pain or productive cough.  At this time we will initiate diagnostic evaluation and treatment for chronic sickle cell pain per sickle cell order set.  Patient is clinically well in appearance.  Patient is reporting chronic sickle cell pain.  Patient was ready to have a Coke and a sandwich after first dose of pain medication.  Stable.  Labs are stable at baseline.  Appropriate to continue home medications and follow-up with sickle cell clinic.     Final diagnoses:  Sickle cell pain crisis (HCC)  Chronic pain syndrome    ED Discharge Orders     None          Armenta Canning, MD 01/12/24 1041

## 2024-01-12 NOTE — ED Notes (Signed)
 Pt offered benadryl  and declined at this time

## 2024-01-12 NOTE — ED Notes (Signed)
 Pt arrives to triage on room air. Is 85% on RA. Sts he uses 4 L of O2 via [place] as needed. Placed on 4 L [place] in triage with improvement to 90%.

## 2024-01-12 NOTE — ED Triage Notes (Signed)
 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 Pt last took oxy 10 mg 5 am.

## 2024-01-13 ENCOUNTER — Other Ambulatory Visit: Payer: Self-pay | Admitting: Nurse Practitioner

## 2024-01-13 ENCOUNTER — Other Ambulatory Visit (HOSPITAL_COMMUNITY): Payer: Self-pay

## 2024-01-13 DIAGNOSIS — D571 Sickle-cell disease without crisis: Secondary | ICD-10-CM

## 2024-01-13 DIAGNOSIS — F1190 Opioid use, unspecified, uncomplicated: Secondary | ICD-10-CM

## 2024-01-13 NOTE — Telephone Encounter (Signed)
 Please advise [facility]

## 2024-01-13 NOTE — Telephone Encounter (Signed)
 Patient calling to check on request. Advised pending on provider approval. Patient states he is completely out.

## 2024-01-14 ENCOUNTER — Other Ambulatory Visit (HOSPITAL_COMMUNITY): Payer: Self-pay

## 2024-01-14 ENCOUNTER — Other Ambulatory Visit: Payer: Self-pay | Admitting: Nurse Practitioner

## 2024-01-14 ENCOUNTER — Telehealth: Payer: Self-pay

## 2024-01-14 DIAGNOSIS — D571 Sickle-cell disease without crisis: Secondary | ICD-10-CM

## 2024-01-14 DIAGNOSIS — F1190 Opioid use, unspecified, uncomplicated: Secondary | ICD-10-CM

## 2024-01-14 MED ORDER — OXYCODONE-ACETAMINOPHEN 10-325 MG PO TABS
1.0000 | ORAL_TABLET | ORAL | 0 refills | Status: DC | PRN
  Filled ????-??-??: fill #0

## 2024-01-14 MED ORDER — OXYCODONE-ACETAMINOPHEN 10-325 MG PO TABS
1.0000 | ORAL_TABLET | ORAL | 0 refills | Status: AC | PRN
  Filled 2024-01-15: qty 90, 15d supply, fill #0

## 2024-01-14 NOTE — Telephone Encounter (Signed)
 Copied from CRM [id number]. Topic: Clinical - Prescription Issue >> Jan 14, 2024 10:19 AM Leonette SQUIBB wrote: Reason for CRM: pt called saying his oxycodone  prescription can't be picked up until the 28th which is on a Sunday.  The pharmacy is closed that day.  He wants to know if they can make an exception as to getting it Saturday instead.  [phone]

## 2024-01-14 NOTE — Telephone Encounter (Signed)
 Reviewed PDMP substance reporting system prior to prescribing opiate medications. No inconsistencies noted.    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  Dispense: 90 tablet; Refill: 0  2. Chronic, continuous use of opioids  - oxyCODONE -acetaminophen  (PERCOCET) 10-325 MG tablet; Take 1 tablet by mouth every 4 (four) hours as needed for pain.  Dispense: 90 tablet; Refill: 0

## 2024-01-14 NOTE — Telephone Encounter (Signed)
 Wanted to know if his medication could be called in to pick up early due to being due Sunday. KH

## 2024-01-15 ENCOUNTER — Other Ambulatory Visit (HOSPITAL_COMMUNITY): Payer: Self-pay

## 2024-01-15 ENCOUNTER — Emergency Department (HOSPITAL_COMMUNITY): Payer: Self-pay

## 2024-01-15 ENCOUNTER — Encounter (HOSPITAL_COMMUNITY): Payer: Self-pay | Admitting: Emergency Medicine

## 2024-01-15 ENCOUNTER — Emergency Department (HOSPITAL_COMMUNITY)
Admission: EM | Admit: 2024-01-15 | Discharge: 2024-01-15 | Discharge status: Against Medical Advice | Payer: Self-pay | Attending: Emergency Medicine | Admitting: Emergency Medicine

## 2024-01-15 ENCOUNTER — Other Ambulatory Visit: Payer: Self-pay

## 2024-01-15 DIAGNOSIS — R7989 Other specified abnormal findings of blood chemistry: Secondary | ICD-10-CM | POA: Insufficient documentation

## 2024-01-15 DIAGNOSIS — Z21 Asymptomatic human immunodeficiency virus [HIV] infection status: Secondary | ICD-10-CM | POA: Insufficient documentation

## 2024-01-15 DIAGNOSIS — Z5329 Procedure and treatment not carried out because of patient's decision for other reasons: Secondary | ICD-10-CM | POA: Insufficient documentation

## 2024-01-15 DIAGNOSIS — D5700 Hb-SS disease with crisis, unspecified: Secondary | ICD-10-CM | POA: Insufficient documentation

## 2024-01-15 DIAGNOSIS — I2699 Other pulmonary embolism without acute cor pulmonale: Secondary | ICD-10-CM | POA: Insufficient documentation

## 2024-01-15 DIAGNOSIS — Z7901 Long term (current) use of anticoagulants: Secondary | ICD-10-CM | POA: Insufficient documentation

## 2024-01-15 DIAGNOSIS — D72829 Elevated white blood cell count, unspecified: Secondary | ICD-10-CM | POA: Insufficient documentation

## 2024-01-15 LAB — CBC WITH DIFFERENTIAL/PLATELET
Abs Immature Granulocytes: 0.05 K/uL (ref 0.00–0.07)
Basophils Absolute: 0 K/uL (ref 0.0–0.1)
Basophils Relative: 0 %
Eosinophils Absolute: 0.1 K/uL (ref 0.0–0.5)
Eosinophils Relative: 1 %
HCT: 24.8 % — ABNORMAL LOW (ref 39.0–52.0)
Hemoglobin: 8.8 g/dL — ABNORMAL LOW (ref 13.0–17.0)
Immature Granulocytes: 0 %
Lymphocytes Relative: 43 %
Lymphs Abs: 4.9 K/uL — ABNORMAL HIGH (ref 0.7–4.0)
MCH: 36.5 pg — ABNORMAL HIGH (ref 26.0–34.0)
MCHC: 35.5 g/dL (ref 30.0–36.0)
MCV: 102.9 fL — ABNORMAL HIGH (ref 80.0–100.0)
Monocytes Absolute: 1.2 K/uL — ABNORMAL HIGH (ref 0.1–1.0)
Monocytes Relative: 11 %
Neutro Abs: 5 K/uL (ref 1.7–7.7)
Neutrophils Relative %: 45 %
Platelets: 294 K/uL (ref 150–400)
RBC: 2.41 MIL/uL — ABNORMAL LOW (ref 4.22–5.81)
RDW: 27.2 % — ABNORMAL HIGH (ref 11.5–15.5)
WBC: 11.2 K/uL — ABNORMAL HIGH (ref 4.0–10.5)
nRBC: 88.4 % — ABNORMAL HIGH (ref 0.0–0.2)

## 2024-01-15 LAB — COMPREHENSIVE METABOLIC PANEL WITH GFR
ALT: 10 U/L (ref 0–44)
AST: 31 U/L (ref 15–41)
Albumin: 4.1 g/dL (ref 3.5–5.0)
Alkaline Phosphatase: 61 U/L (ref 38–126)
Anion gap: 12 (ref 5–15)
BUN: 7 mg/dL (ref 6–20)
CO2: 19 mmol/L — ABNORMAL LOW (ref 22–32)
Calcium: 8.6 mg/dL — ABNORMAL LOW (ref 8.9–10.3)
Chloride: 109 mmol/L (ref 98–111)
Creatinine, Ser: 0.84 mg/dL (ref 0.61–1.24)
GFR, Estimated: >60 mL/min (ref >60)
Glucose, Bld: 91 mg/dL (ref 70–99)
Potassium: 3.5 mmol/L (ref 3.5–5.1)
Sodium: 139 mmol/L (ref 135–145)
Total Bilirubin: 10 mg/dL — ABNORMAL HIGH (ref 0.0–1.2)
Total Protein: 7.6 g/dL (ref 6.5–8.1)

## 2024-01-15 LAB — RETICULOCYTES
Immature Retic Fract: 31.8 % — ABNORMAL HIGH (ref 2.3–15.9)
RBC.: 2.16 MIL/uL — ABNORMAL LOW (ref 4.22–5.81)
Retic Count, Absolute: 545.6 K/uL — ABNORMAL HIGH (ref 19.0–186.0)
Retic Ct Pct: 25.3 % — ABNORMAL HIGH (ref 0.4–3.1)

## 2024-01-15 MED ORDER — DIPHENHYDRAMINE HCL 50 MG/ML IJ SOLN
25.0000 mg | Freq: Once | INTRAMUSCULAR | Status: AC
  Administered 2024-01-15: 25 mg via INTRAVENOUS
  Filled 2024-01-15: qty 1

## 2024-01-15 MED ORDER — OXYCODONE-ACETAMINOPHEN 5-325 MG PO TABS
1.0000 | ORAL_TABLET | Freq: Once | ORAL | Status: AC
  Administered 2024-01-15: 1 via ORAL
  Filled 2024-01-15: qty 1

## 2024-01-15 MED ORDER — ACETAMINOPHEN 500 MG PO TABS
1000.0000 mg | ORAL_TABLET | Freq: Once | ORAL | Status: AC
  Administered 2024-01-15: 1000 mg via ORAL
  Filled 2024-01-15: qty 2

## 2024-01-15 MED ORDER — IOHEXOL 350 MG/ML SOLN
75.0000 mL | Freq: Once | INTRAVENOUS | Status: AC | PRN
  Administered 2024-01-15: 75 mL via INTRAVENOUS

## 2024-01-15 MED ORDER — HYDROMORPHONE HCL 1 MG/ML IJ SOLN
2.0000 mg | Freq: Once | INTRAMUSCULAR | Status: AC
  Administered 2024-01-15: 2 mg via INTRAVENOUS
  Filled 2024-01-15: qty 2

## 2024-01-15 MED ORDER — SODIUM CHLORIDE 0.9 % IV BOLUS
1000.0000 mL | Freq: Once | INTRAVENOUS | Status: AC
  Administered 2024-01-15: 1000 mL via INTRAVENOUS

## 2024-01-15 NOTE — ED Notes (Signed)
 Patient refused EKG, stated he already signed out so what's the point. RN notified

## 2024-01-15 NOTE — Discharge Instructions (Addendum)
 As we discussed, your CT scan tonight did show that you have a new pulmonary embolism.  I wished to admit you to the hospital for this however you left AGAINST MEDICAL ADVICE.  Please continue taking all home medications as prescribed to include anticoagulation.  If you change your mind about admission, please return to the ED.

## 2024-01-15 NOTE — ED Provider Notes (Signed)
 [place] EMERGENCY DEPARTMENT AT [facility] Provider Note   CSN: [id number] Arrival date & time: 01/15/24  0022     Patient presents with: Sickle Cell Pain Crisis   Martin [name] is a 33 y.o. male with history of sickle cell disease, HIV, anxiety.  Patient presents to ED for evaluation of pain crisis.  States that for the last day he has had pain consistent with sickle cell disease.  Reports pain all over consistent with past episodes.  Denies chest pain or shortness of breath.  Denies increasing oxygen  recently.  Patient did arrive hypoxic however was not on home oxygen .  He reports compliance with anticoagulation.   Sickle Cell Pain Crisis      Prior to Admission medications   Medication Sig Start Date End Date Taking? Authorizing Provider  abacavir -dolutegravir -lamiVUDine  (TRIUMEQ ) 600-50-300 MG tablet Take 1 tablet by mouth daily. Patient taking differently: Take 1 tablet by mouth at bedtime. 08/13/23  Yes Emokpae, Courage, MD  apixaban  (ELIQUIS ) 5 MG TABS tablet Take 1 tablet (5 mg total) by mouth 2 (two) times daily. 08/13/23  Yes Pearlean Manus, MD  Cholecalciferol  (VITAMIN D3) 125 MCG (5000 UT) CAPS Take 5,000 Units by mouth every Tuesday.   Yes [provider]  cyclobenzaprine  (FLEXERIL ) 10 MG tablet Take 10 mg by mouth 2 (two) times daily as needed for muscle spasms. 12/22/22  Yes [provider]  diphenhydrAMINE  (BENADRYL ) 25 MG tablet Take 1 tablet (25 mg total) by mouth every 4 (four) hours as needed for itching. Patient taking differently: Take 50 mg by mouth daily as needed for itching. 08/13/23  Yes Emokpae, Courage, MD  folic acid  (FOLVITE ) 400 MCG tablet Take 1 tablet (400 mcg total) by mouth daily. 08/13/23  Yes Emokpae, Courage, MD  hydroxyurea  (HYDREA ) 500 MG capsule Take 4 capsules (2,000 mg total) by mouth at bedtime. 08/13/23  Yes Pearlean Manus, MD  NARCAN  4 MG/0.1ML LIQD nasal spray kit Place 1 spray into the nose as needed  (respiratory distress / opioid overdose). 01/07/23  Yes [provider]  oxyCODONE -acetaminophen  (PERCOCET) 10-325 MG tablet Take 1 tablet by mouth every 4 (four) hours as needed for up to 16 days for pain. 01/15/24 01/31/24 Yes [name], [name], [name]  Vitamin D , Ergocalciferol , (DRISDOL ) 1.25 MG (50000 UNIT) CAPS capsule Take 1 capsule (50,000 Units total) by mouth every 7 (seven) days. 11/05/23  Yes [name], [name], [name]  albuterol  (VENTOLIN  HFA) 108 (90 Base) MCG/ACT inhaler Inhale 2 puffs into the lungs every 6 (six) hours as needed for wheezing or shortness of breath. Patient not taking: Reported on 10/20/2023 08/13/23   Pearlean Manus, MD  Deferasirox  (JADENU ) 360 MG TABS Take 2 tablets (720 mg total) by mouth daily. Patient not taking: Reported on 01/04/2024 11/05/23   [name], [name], [name]  gabapentin  (NEURONTIN ) 300 MG capsule Take 1 capsule (300 mg total) by mouth at bedtime as needed (nerve pain). Patient not taking: Reported on 01/04/2024 08/13/23   Pearlean Manus, MD  guaiFENesin  (MUCINEX ) 600 MG 12 hr tablet Take 1 tablet (600 mg total) by mouth 2 (two) times daily. Patient not taking: Reported on 01/04/2024 08/13/23 08/12/24  Pearlean Manus, MD  pantoprazole  (PROTONIX ) 40 MG tablet Take 1 tablet (40 mg total) by mouth daily as needed (acid reflux). Patient not taking: Reported on 01/04/2024 08/13/23   Pearlean Manus, MD  senna-docusate (SENOKOT-S) 8.6-50 MG tablet Take 2 tablets by mouth at bedtime. Patient not taking: Reported on 01/11/2024 08/13/23  Pearlean Manus, MD  triamcinolone  cream (KENALOG ) 0.1 % Apply 1 Application topically 2 (two) times daily. Patient not taking: Reported on 01/04/2024 01/12/23   Tilford Bertram HERO, [name]  Vitamin D , Ergocalciferol , (DRISDOL ) 1.25 MG (50000 UNIT) CAPS capsule Take 1 capsule (50,000 Units total) by mouth every 7 (seven) days. Patient not taking: Reported on 01/04/2024 11/09/23   [name], [name], [name]    Allergies: Ketorolac   tromethamine , Tape, and Wound dressing adhesive    Review of Systems  Musculoskeletal:  Positive for arthralgias.  All other systems reviewed and are negative.   Updated Vital Signs BP 104/72   Pulse 72   Temp 98.6 F (37 C)   Resp 17   SpO2 97%   Physical Exam Vitals and nursing note reviewed.  Constitutional:      General: He is not in acute distress.    Appearance: He is well-developed.  HENT:     Head: Normocephalic and atraumatic.  Eyes:     Conjunctiva/sclera: Conjunctivae normal.  Cardiovascular:     Rate and Rhythm: Normal rate and regular rhythm.     Heart sounds: No murmur heard. Pulmonary:     Effort: Pulmonary effort is normal. No respiratory distress.     Breath sounds: Normal breath sounds.  Abdominal:     Palpations: Abdomen is soft.     Tenderness: There is no abdominal tenderness.  Musculoskeletal:        General: No swelling.     Cervical back: Neck supple.     Right lower leg: No edema.     Left lower leg: No edema.  Skin:    General: Skin is warm and dry.     Capillary Refill: Capillary refill takes less than 2 seconds.  Neurological:     Mental Status: He is alert and oriented to person, place, and time. Mental status is at baseline.  Psychiatric:        Mood and Affect: Mood normal.     (all labs ordered are listed, but only abnormal results are displayed) Labs Reviewed  COMPREHENSIVE METABOLIC PANEL WITH GFR - Abnormal; Notable for the following components:      Result Value   CO2 19 (*)    Calcium  8.6 (*)    Total Bilirubin 10.0 (*)    All other components within normal limits  CBC WITH DIFFERENTIAL/PLATELET - Abnormal; Notable for the following components:   WBC 11.2 (*)    RBC 2.41 (*)    Hemoglobin 8.8 (*)    HCT 24.8 (*)    MCV 102.9 (*)    MCH 36.5 (*)    RDW 27.2 (*)    nRBC 88.4 (*)    Lymphs Abs 4.9 (*)    Monocytes Absolute 1.2 (*)    All other components within normal limits  RETICULOCYTES - Abnormal; Notable for  the following components:   Retic Ct Pct 25.3 (*)    RBC. 2.16 (*)    Retic Count, Absolute 545.6 (*)    Immature Retic Fract 31.8 (*)    All other components within normal limits    EKG: None  Radiology: CT Angio Chest PE W and/or Wo Contrast Addendum Date: 01/15/2024 ADDENDUM REPORT: 01/15/2024 04:48 ADDENDUM: Study discussed by telephone with Dr. Trine in the ED on 01/15/2024 at 0419 hours. Electronically Signed   By: VEAR Hurst M.D.   On: 01/15/2024 04:48   Result Date: 01/15/2024 CLINICAL DATA:  33 year old male with hypoxia.  Sickle cell disease. EXAM: CT  ANGIOGRAPHY CHEST WITH CONTRAST TECHNIQUE: Multidetector CT imaging of the chest was performed using the standard protocol during bolus administration of intravenous contrast. Multiplanar CT image reconstructions and MIPs were obtained to evaluate the vascular anatomy. RADIATION DOSE REDUCTION: This exam was performed according to the departmental dose-optimization program which includes automated exposure control, adjustment of the mA and/or kV according to patient size and/or use of iterative reconstruction technique. CONTRAST:  75mL OMNIPAQUE  IOHEXOL  350 MG/ML SOLN COMPARISON:  Chest radiographs 0048 hours today. CTA chest 01/04/2024 earlier. FINDINGS: Cardiovascular: Good contrast bolus timing in the pulmonary arterial tree. No central or lobar pulmonary artery filling defect. Right lower lobe segmental or subsegmental low-density pulmonary artery filling defect on series 10, image 253 is consistent with pulmonary artery thrombus. But no other pulmonary artery filling defect is identified. Stable mild to moderate cardiomegaly. No pericardial effusion. Negative visible aorta. Right chest Port-A-Cath remains in place. Mediastinum/Nodes: Stable reactive appearing mediastinal and hilar lymph nodes. Lungs/Pleura: Improved lung volumes compared to 01/04/2024. Major airways remain patent, other than trace retained secretions along the anterior wall  of the trachea at the thoracic inlet on the right widespread pulmonary mosaic attenuation with superimposed areas of overt pulmonary scarring and architectural distortion including the lung apices, both lung bases. No pleural effusion. No consolidation. Nor is worsening ventilation compared to 01/04/2024. Upper Abdomen: Small calcified splenic remnant redemonstrated. Negative visible liver, pancreas, adrenal glands, kidneys, and fluid containing stomach in the upper abdomen. Musculoskeletal: Course bone mineralization throughout the visible skeleton. Maintained thoracic vertebral height and endplates. No acute or suspicious osseous lesion. Review of the MIP images confirms the above findings. IMPRESSION: 1. Positive for a small right lower lobe segmental or subsegmental pulmonary embolus. No other pulmonary artery filling defect identified. 2. Sequelae of Sickle Cell Disease including cardiomegaly, splenic atrophy, skeletal changes. Widespread pulmonary mosaic attenuation with scattered scarring also most likely chronic small vessel disease related. No areas of acute opacity or worsening ventilation compared to the CTA 01/04/2024. No pleural effusion. Stable reactive appearing mediastinal lymph nodes. Electronically Signed: By: VEAR Hurst M.D. On: 01/15/2024 04:15   DG Chest 2 View Result Date: 01/15/2024 CLINICAL DATA:  Low O2. EXAM: CHEST - 2 VIEW COMPARISON:  January 03, 2024 FINDINGS: There is stable right-sided venous Port-A-Cath positioning. The heart size and mediastinal contours are within normal limits. Chronic appearing increased interstitial lung markings are seen. Mild, hazy superimposed opacities are seen involving the mid to lower lung fields, bilaterally. This is minimally increased in severity when compared to the prior study. No pleural effusion or pneumothorax is identified. No acute osseous abnormalities are identified. IMPRESSION: Chronic appearing increased interstitial lung markings with  additional findings that may represent a superimposed atypical infectious or inflammatory process involving the mid to lower lung fields, bilaterally. Electronically Signed   By: Suzen Dials M.D.   On: 01/15/2024 01:23    Procedures   Medications Ordered in the ED  HYDROmorphone  (DILAUDID ) injection 2 mg (2 mg Intravenous Given 01/15/24 0123)  diphenhydrAMINE  (BENADRYL ) injection 25 mg (25 mg Intravenous Given 01/15/24 0123)  oxyCODONE -acetaminophen  (PERCOCET/ROXICET) 5-325 MG per tablet 1 tablet (1 tablet Oral Given 01/15/24 0123)  acetaminophen  (TYLENOL ) tablet 1,000 mg (1,000 mg Oral Given 01/15/24 0123)  HYDROmorphone  (DILAUDID ) injection 2 mg (2 mg Intravenous Given 01/15/24 0230)  sodium chloride  0.9 % bolus 1,000 mL (0 mLs Intravenous Stopped 01/15/24 0407)  iohexol  (OMNIPAQUE ) 350 MG/ML injection 75 mL (75 mLs Intravenous Contrast Given 01/15/24 0332)  HYDROmorphone  (DILAUDID ) injection 2  mg (2 mg Intravenous Given 01/15/24 0421)  diphenhydrAMINE  (BENADRYL ) injection 25 mg (25 mg Intravenous Given 01/15/24 0421)    Clinical Course as of 01/15/24 0450  Sat Jan 15, 2024  0229 DG Chest 2 View [CG]    Clinical Course User Index [CG] Ruthell Lonni FALCON, PA-C   Medical Decision Making Amount and/or Complexity of Data Reviewed Labs: ordered. Radiology: ordered. Decision-making details documented in ED Course.  Risk OTC drugs. Prescription drug management.   This is a 33 year old male presenting to the ED with concern of sickle cell pain crisis.  On exam, he is hemodynamically stable.  He is afebrile and nontachycardic.  His lung sounds are clear bilaterally, he hypoxic however improved after home oxygen  2 L nasal cannula convection saturation maintained at 95%.  Abdomen soft and compressible.  Neuroexam at baseline.  No edema to bilateral lower extremities.  Patient labs collected to include CBC, CMP, reticulocytes.  Given 2 mg Dilaudid , 25 mg IV Benadryl , 1 L fluid,  Tylenol .  Patient CBC with leukocytosis 11.2, baseline hemoglobin 8.8.  Metabolic panel with total bilirubin 10, no elevated LFT, anion gap 12, elevated creatinine, no electrolyte derangement.  Reticulocytes elevated.  Chest x-ray shows chronic appearing increased interstitial lung markings with additional findings of areas of superimposed atypical infectious or clinically process involving the mid or lower lung fields.  Due to this, CTA was collected.  CTA shows positive for small right lower lobe segmental or subsegmental pulmonary embolism.  Patient refused EKG.  Patient reports that he is compliant on blood thinning medication.  There is concern that the patient has developed a PE while on blood thinning medication.  I advised the patient that I wished to admit him to the hospital however the patient wishes that he does not wish to be admitted to the hospital this close to his birthday, he wishes to sign out AGAINST MEDICAL ADVICE.  My attending Dr. Trine also attempted to discuss this with the patient however the patient continued to state that he wished to go home.  He was advised to follow-up with the sickle cell day clinic.  Advised to continue taking anticoagulation.  He was advised that if he changes his mind to return to the ED for further care and he voiced understanding.  Patient is signing out AGAINST MEDICAL ADVICE.    Final diagnoses:  Sickle cell pain crisis [facility])  Other acute pulmonary embolism, unspecified whether acute cor pulmonale present [facility])  Left against medical advice    ED Discharge Orders     None          Ruthell Lonni FALCON DEVONNA 01/15/24 0450    Trine Raynell Moder, MD 01/15/24 [phone]

## 2024-01-15 NOTE — ED Notes (Signed)
 AMA paperwork signed by patient. Given sandwich tray and cola per pt request. Medication administered per order. Will continue to monitor patient prior to patient leaving AMA s/p medication administration.

## 2024-01-15 NOTE — ED Notes (Signed)
 Pt was given a soda and a ham sandwich

## 2024-01-15 NOTE — ED Notes (Signed)
 Pt signed out AMA. Paperwork provided and instructions given. Port de-accessed.

## 2024-01-15 NOTE — ED Triage Notes (Signed)
 Patient c/o Sickle cell pain x 1 day. Patient report taking PRN pain medication without relief. Patient denies Chest pain and SOB. Patient denies fever at home. Baseline home o2 at [facility].

## 2024-01-15 NOTE — ED Notes (Signed)
 Patient transported to CT

## 2024-01-15 NOTE — ED Notes (Signed)
 Pt refused to have EKG. [place] PA notified.

## 2024-01-16 ENCOUNTER — Emergency Department (HOSPITAL_COMMUNITY)
Admission: EM | Admit: 2024-01-16 | Discharge: 2024-01-16 | Discharge status: Against Medical Advice | Payer: Self-pay | Attending: Emergency Medicine | Admitting: Emergency Medicine

## 2024-01-16 ENCOUNTER — Encounter (HOSPITAL_COMMUNITY): Payer: Self-pay

## 2024-01-16 ENCOUNTER — Inpatient Hospital Stay (HOSPITAL_COMMUNITY)
Admission: EM | Admit: 2024-01-16 | Discharge: 2024-01-21 | DRG: 175 | Disposition: A | Discharge status: Home / Self Care | Payer: Self-pay | Attending: Internal Medicine | Admitting: Internal Medicine

## 2024-01-16 ENCOUNTER — Emergency Department (HOSPITAL_COMMUNITY): Payer: Self-pay

## 2024-01-16 ENCOUNTER — Other Ambulatory Visit: Payer: Self-pay

## 2024-01-16 DIAGNOSIS — Z79899 Other long term (current) drug therapy: Secondary | ICD-10-CM

## 2024-01-16 DIAGNOSIS — Z21 Asymptomatic human immunodeficiency virus [HIV] infection status: Secondary | ICD-10-CM | POA: Diagnosis present

## 2024-01-16 DIAGNOSIS — F411 Generalized anxiety disorder: Secondary | ICD-10-CM | POA: Diagnosis present

## 2024-01-16 DIAGNOSIS — I2699 Other pulmonary embolism without acute cor pulmonale: Secondary | ICD-10-CM | POA: Diagnosis present

## 2024-01-16 DIAGNOSIS — D5700 Hb-SS disease with crisis, unspecified: Secondary | ICD-10-CM | POA: Diagnosis present

## 2024-01-16 DIAGNOSIS — D72829 Elevated white blood cell count, unspecified: Secondary | ICD-10-CM | POA: Diagnosis present

## 2024-01-16 DIAGNOSIS — Z5329 Procedure and treatment not carried out because of patient's decision for other reasons: Secondary | ICD-10-CM | POA: Insufficient documentation

## 2024-01-16 DIAGNOSIS — Z9981 Dependence on supplemental oxygen: Secondary | ICD-10-CM

## 2024-01-16 DIAGNOSIS — Z7901 Long term (current) use of anticoagulants: Secondary | ICD-10-CM | POA: Insufficient documentation

## 2024-01-16 DIAGNOSIS — Z832 Family history of diseases of the blood and blood-forming organs and certain disorders involving the immune mechanism: Secondary | ICD-10-CM

## 2024-01-16 DIAGNOSIS — Z86711 Personal history of pulmonary embolism: Secondary | ICD-10-CM

## 2024-01-16 DIAGNOSIS — Z888 Allergy status to other drugs, medicaments and biological substances status: Secondary | ICD-10-CM

## 2024-01-16 DIAGNOSIS — Z681 Body mass index (BMI) 19 or less, adult: Secondary | ICD-10-CM

## 2024-01-16 DIAGNOSIS — Z91048 Other nonmedicinal substance allergy status: Secondary | ICD-10-CM

## 2024-01-16 DIAGNOSIS — Z91148 Patient's other noncompliance with medication regimen for other reason: Secondary | ICD-10-CM

## 2024-01-16 DIAGNOSIS — J9621 Acute and chronic respiratory failure with hypoxia: Principal | ICD-10-CM | POA: Diagnosis present

## 2024-01-16 DIAGNOSIS — D638 Anemia in other chronic diseases classified elsewhere: Secondary | ICD-10-CM | POA: Diagnosis present

## 2024-01-16 DIAGNOSIS — I2693 Single subsegmental pulmonary embolism without acute cor pulmonale: Principal | ICD-10-CM | POA: Diagnosis present

## 2024-01-16 DIAGNOSIS — E876 Hypokalemia: Secondary | ICD-10-CM | POA: Diagnosis present

## 2024-01-16 DIAGNOSIS — J9611 Chronic respiratory failure with hypoxia: Secondary | ICD-10-CM | POA: Diagnosis present

## 2024-01-16 DIAGNOSIS — R636 Underweight: Secondary | ICD-10-CM | POA: Diagnosis present

## 2024-01-16 DIAGNOSIS — G894 Chronic pain syndrome: Secondary | ICD-10-CM | POA: Diagnosis present

## 2024-01-16 DIAGNOSIS — E872 Acidosis, unspecified: Secondary | ICD-10-CM | POA: Diagnosis present

## 2024-01-16 LAB — CBC WITH DIFFERENTIAL/PLATELET
Abs Immature Granulocytes: 0.03 K/uL (ref 0.00–0.07)
Basophils Absolute: 0.1 K/uL (ref 0.0–0.1)
Basophils Relative: 1 %
Eosinophils Absolute: 0.1 K/uL (ref 0.0–0.5)
Eosinophils Relative: 1 %
HCT: 21 % — ABNORMAL LOW (ref 39.0–52.0)
Hemoglobin: 8.6 g/dL — ABNORMAL LOW (ref 13.0–17.0)
Immature Granulocytes: 0 %
Lymphocytes Relative: 48 %
Lymphs Abs: 4.6 K/uL — ABNORMAL HIGH (ref 0.7–4.0)
MCH: 36.5 pg — ABNORMAL HIGH (ref 26.0–34.0)
MCHC: 36.5 g/dL — ABNORMAL HIGH (ref 30.0–36.0)
MCV: 100 fL (ref 80.0–100.0)
Monocytes Absolute: 1 K/uL (ref 0.1–1.0)
Monocytes Relative: 10 %
Neutro Abs: 3.9 K/uL (ref 1.7–7.7)
Neutrophils Relative %: 40 %
Platelets: 239 K/uL (ref 150–400)
RBC: 2.05 MIL/uL — ABNORMAL LOW (ref 4.22–5.81)
RDW: 29.1 % — ABNORMAL HIGH (ref 11.5–15.5)
WBC: 9.7 K/uL (ref 4.0–10.5)
nRBC: 68 % — ABNORMAL HIGH (ref 0.0–0.2)

## 2024-01-16 LAB — COMPREHENSIVE METABOLIC PANEL WITH GFR
ALT: 15 U/L (ref 0–44)
AST: 27 U/L (ref 15–41)
Albumin: 3.7 g/dL (ref 3.5–5.0)
Alkaline Phosphatase: 55 U/L (ref 38–126)
Anion gap: 10 (ref 5–15)
BUN: 7 mg/dL (ref 6–20)
CO2: 21 mmol/L — ABNORMAL LOW (ref 22–32)
Calcium: 8.4 mg/dL — ABNORMAL LOW (ref 8.9–10.3)
Chloride: 108 mmol/L (ref 98–111)
Creatinine, Ser: 1.05 mg/dL (ref 0.61–1.24)
GFR, Estimated: >60 mL/min (ref >60)
Glucose, Bld: 87 mg/dL (ref 70–99)
Potassium: 3.4 mmol/L — ABNORMAL LOW (ref 3.5–5.1)
Sodium: 139 mmol/L (ref 135–145)
Total Bilirubin: 10.2 mg/dL — ABNORMAL HIGH (ref 0.0–1.2)
Total Protein: 7.9 g/dL (ref 6.5–8.1)

## 2024-01-16 LAB — HEPARIN LEVEL (UNFRACTIONATED): Heparin Unfractionated: 1.03 [IU]/mL — ABNORMAL HIGH (ref 0.30–0.70)

## 2024-01-16 LAB — RETICULOCYTES
Immature Retic Fract: 25 % — ABNORMAL HIGH (ref 2.3–15.9)
RBC.: 2.55 MIL/uL — ABNORMAL LOW (ref 4.22–5.81)
Retic Count, Absolute: 749 K/uL — ABNORMAL HIGH (ref 19.0–186.0)
Retic Ct Pct: 29.4 % — ABNORMAL HIGH (ref 0.4–3.1)

## 2024-01-16 LAB — TROPONIN T, HIGH SENSITIVITY: Troponin T High Sensitivity: 17 ng/L (ref 0–19)

## 2024-01-16 LAB — APTT: aPTT: 39 s — ABNORMAL HIGH (ref 24–36)

## 2024-01-16 LAB — PRO BRAIN NATRIURETIC PEPTIDE: Pro Brain Natriuretic Peptide: 2143 pg/mL — ABNORMAL HIGH (ref <300.0)

## 2024-01-16 MED ORDER — ONDANSETRON HCL 4 MG/2ML IJ SOLN
4.0000 mg | Freq: Four times a day (QID) | INTRAMUSCULAR | Status: DC | PRN
Start: 2024-01-16 — End: 2024-01-16

## 2024-01-16 MED ORDER — SODIUM CHLORIDE 0.9% FLUSH: 3.0000 mL | Freq: Two times a day (BID) | INTRAVENOUS | Status: DC

## 2024-01-16 MED ORDER — SODIUM CHLORIDE 0.9% FLUSH: 10.0000 mL | INTRAVENOUS | Status: DC | PRN

## 2024-01-16 MED ORDER — SODIUM CHLORIDE 0.9% FLUSH: 3.0000 mL | INTRAVENOUS | Status: DC | PRN

## 2024-01-16 MED ORDER — OXYCODONE-ACETAMINOPHEN 10-325 MG PO TABS: 1.0000 | ORAL_TABLET | ORAL | Status: DC | PRN

## 2024-01-16 MED ORDER — ONDANSETRON HCL 4 MG/2ML IJ SOLN
4.0000 mg | INTRAMUSCULAR | Status: DC | PRN
  Administered 2024-01-17 – 2024-01-20 (×6): 4 mg via INTRAVENOUS
  Filled 2024-01-16 (×7): qty 2

## 2024-01-16 MED ORDER — FOLIC ACID 1 MG PO TABS: 500.0000 ug | ORAL_TABLET | Freq: Every day | ORAL | Status: DC

## 2024-01-16 MED ORDER — OXYCODONE-ACETAMINOPHEN 5-325 MG PO TABS
1.0000 | ORAL_TABLET | ORAL | Status: DC | PRN
  Administered 2024-01-17 – 2024-01-21 (×17): 1 via ORAL
  Filled 2024-01-16 (×18): qty 1

## 2024-01-16 MED ORDER — DIPHENHYDRAMINE HCL 50 MG/ML IJ SOLN
12.5000 mg | Freq: Once | INTRAMUSCULAR | Status: AC
  Administered 2024-01-16: 12.5 mg via INTRAVENOUS
  Filled 2024-01-16: qty 1

## 2024-01-16 MED ORDER — FOLIC ACID 1 MG PO TABS
500.0000 ug | ORAL_TABLET | Freq: Every day | ORAL | Status: DC
  Administered 2024-01-17 – 2024-01-21 (×5): 0.5 mg via ORAL
  Filled 2024-01-16 (×6): qty 1

## 2024-01-16 MED ORDER — SODIUM CHLORIDE 0.9% FLUSH: 9.0000 mL | INTRAVENOUS | Status: DC | PRN

## 2024-01-16 MED ORDER — HYDROMORPHONE HCL 1 MG/ML IJ SOLN
2.0000 mg | INTRAMUSCULAR | Status: AC
  Administered 2024-01-16: 2 mg via INTRAVENOUS
  Filled 2024-01-16: qty 2

## 2024-01-16 MED ORDER — PANTOPRAZOLE SODIUM 40 MG PO TBEC: 40.0000 mg | DELAYED_RELEASE_TABLET | Freq: Every day | ORAL | Status: DC | PRN

## 2024-01-16 MED ORDER — ABACAVIR-DOLUTEGRAVIR-LAMIVUD 600-50-300 MG PO TABS
1.0000 | ORAL_TABLET | Freq: Every day | ORAL | Status: DC
  Administered 2024-01-16 – 2024-01-20 (×5): 1 via ORAL
  Filled 2024-01-16 (×5): qty 1

## 2024-01-16 MED ORDER — DIPHENHYDRAMINE HCL 25 MG PO CAPS: 25.0000 mg | ORAL_CAPSULE | ORAL | Status: DC | PRN

## 2024-01-16 MED ORDER — ONDANSETRON HCL 4 MG/2ML IJ SOLN
4.0000 mg | INTRAMUSCULAR | Status: DC | PRN
  Administered 2024-01-16: 4 mg via INTRAVENOUS
  Filled 2024-01-16: qty 2

## 2024-01-16 MED ORDER — RIVAROXABAN 20 MG PO TABS: 20.0000 mg | ORAL_TABLET | Freq: Every day | ORAL | Status: DC

## 2024-01-16 MED ORDER — HYDROMORPHONE 1 MG/ML IV SOLN
INTRAVENOUS | Status: DC
  Administered 2024-01-16: 30 mg via INTRAVENOUS
  Administered 2024-01-17: 4.81 mg via INTRAVENOUS
  Administered 2024-01-17: 5.5 mg via INTRAVENOUS
  Filled 2024-01-16: qty 30

## 2024-01-16 MED ORDER — LACTATED RINGERS IV SOLN: INTRAVENOUS | Status: DC

## 2024-01-16 MED ORDER — ACETAMINOPHEN 10 MG/ML IV SOLN: 1000.0000 mg | Freq: Four times a day (QID) | INTRAVENOUS | Status: DC

## 2024-01-16 MED ORDER — HYDROXYUREA 500 MG PO CAPS
2000.0000 mg | ORAL_CAPSULE | Freq: Every day | ORAL | Status: DC
  Administered 2024-01-16 – 2024-01-20 (×5): 2000 mg via ORAL
  Filled 2024-01-16 (×5): qty 4

## 2024-01-16 MED ORDER — OXYCODONE HCL 5 MG PO TABS
5.0000 mg | ORAL_TABLET | ORAL | Status: DC | PRN
Start: 2024-01-16 — End: 2024-01-21
  Administered 2024-01-17 – 2024-01-21 (×17): 5 mg via ORAL
  Filled 2024-01-16 (×18): qty 1

## 2024-01-16 MED ORDER — NALOXONE HCL 0.4 MG/ML IJ SOLN: 0.4000 mg | INTRAMUSCULAR | Status: DC | PRN

## 2024-01-16 MED ORDER — ACETAMINOPHEN 650 MG RE SUPP: 650.0000 mg | Freq: Four times a day (QID) | RECTAL | Status: DC | PRN

## 2024-01-16 MED ORDER — SODIUM CHLORIDE 0.9 % IV SOLN: 250.0000 mL | INTRAVENOUS | Status: DC | PRN

## 2024-01-16 MED ORDER — ACETAMINOPHEN 325 MG PO TABS: 650.0000 mg | ORAL_TABLET | Freq: Four times a day (QID) | ORAL | Status: DC | PRN

## 2024-01-16 MED ORDER — ABACAVIR-DOLUTEGRAVIR-LAMIVUD 600-50-300 MG PO TABS: 1.0000 | ORAL_TABLET | Freq: Every day | ORAL | Status: DC

## 2024-01-16 MED ORDER — SENNOSIDES-DOCUSATE SODIUM 8.6-50 MG PO TABS: 2.0000 | ORAL_TABLET | Freq: Every day | ORAL | Status: DC

## 2024-01-16 MED ORDER — POTASSIUM CHLORIDE CRYS ER 20 MEQ PO TBCR
20.0000 meq | EXTENDED_RELEASE_TABLET | ORAL | Status: AC
  Administered 2024-01-16 (×2): 20 meq via ORAL
  Filled 2024-01-16 (×2): qty 1

## 2024-01-16 MED ORDER — DIPHENHYDRAMINE HCL 25 MG PO CAPS
25.0000 mg | ORAL_CAPSULE | ORAL | Status: DC | PRN
  Administered 2024-01-16: 25 mg via ORAL
  Filled 2024-01-16: qty 1

## 2024-01-16 MED ORDER — HYDROMORPHONE HCL 1 MG/ML IJ SOLN
2.0000 mg | INTRAMUSCULAR | Status: AC
  Administered 2024-01-16: 2 mg via INTRAVENOUS

## 2024-01-16 MED ORDER — CHLORHEXIDINE GLUCONATE CLOTH 2 % EX PADS
6.0000 | MEDICATED_PAD | Freq: Every day | CUTANEOUS | Status: DC
  Administered 2024-01-16 – 2024-01-21 (×5): 6 via TOPICAL

## 2024-01-16 MED ORDER — OXYCODONE HCL 5 MG PO TABS
15.0000 mg | ORAL_TABLET | Freq: Once | ORAL | Status: AC
  Administered 2024-01-16: 15 mg via ORAL
  Filled 2024-01-16: qty 3

## 2024-01-16 MED ORDER — SODIUM CHLORIDE 0.9 % IV BOLUS (SEPSIS)
1000.0000 mL | Freq: Once | INTRAVENOUS | Status: AC
  Administered 2024-01-16: 1000 mL via INTRAVENOUS

## 2024-01-16 MED ORDER — DIPHENHYDRAMINE HCL 50 MG/ML IJ SOLN: 25.0000 mg | Freq: Four times a day (QID) | INTRAMUSCULAR | Status: DC | PRN

## 2024-01-16 MED ORDER — HYDROMORPHONE HCL 1 MG/ML IJ SOLN
2.0000 mg | INTRAMUSCULAR | Status: AC
  Filled 2024-01-16: qty 2

## 2024-01-16 MED ORDER — RIVAROXABAN 15 MG PO TABS
15.0000 mg | ORAL_TABLET | Freq: Two times a day (BID) | ORAL | Status: DC
Start: 2024-01-17 — End: 2024-01-23

## 2024-01-16 MED ORDER — HYDROMORPHONE 1 MG/ML IV SOLN: INTRAVENOUS | Status: DC

## 2024-01-16 MED ORDER — RIVAROXABAN 15 MG PO TABS
15.0000 mg | ORAL_TABLET | ORAL | Status: AC
  Administered 2024-01-16: 15 mg via ORAL
  Filled 2024-01-16: qty 1

## 2024-01-16 MED ORDER — ALBUTEROL SULFATE (2.5 MG/3ML) 0.083% IN NEBU
3.0000 mL | INHALATION_SOLUTION | Freq: Four times a day (QID) | RESPIRATORY_TRACT | Status: DC | PRN
Start: 2024-01-16 — End: 2024-01-16

## 2024-01-16 MED ORDER — LACTATED RINGERS IV BOLUS: 1000.0000 mL | Freq: Once | INTRAVENOUS | Status: DC

## 2024-01-16 MED ORDER — SODIUM CHLORIDE 0.45 % IV SOLN: INTRAVENOUS | Status: AC

## 2024-01-16 NOTE — H&P (Addendum)
 History and Physical  Martin [name] FMW:[id number] DOB: 1991-03-26 DOA: 01/16/2024  Referring physician: Dr. Charlyn, EDP  PCP: [name], [name], [name]  Outpatient Specialists: [facility] cell clinic  Patient coming from: Home  Chief Complaint:  Sickle cell pain  HPI: Martin [name] is a 33 y.o. male with medical history significant for prior pulmonary embolism, noncompliant with home Eliquis , sickle cell disease, HIV, chronic hypoxia on 4 L as needed O2 supplementation, who initially presented at [facility] ED earlier today, with complaints of sickle cell pain that started last night, which consist of diffuse body pain.  Not improved with home opiate analgesics.  In the ER, he was noted to be hypoxic improved after 2 L [place].  He had an abnormal chest x-ray which led to CT angio chest.  This was positive for small right lower lobe segmental or subsegmental pulmonary embolism.  He was advised to be admitted to the hospital however he refused and signed out AGAINST MEDICAL ADVICE.  The patient returns to the ER, at this time, he presents at [facility] with the same symptoms.  Also endorses pleuritic chest pain worse with exertion and taking a deep breath.  Denies cough or hemoptysis.  The pain is centrally located and nonradiating.  The patient received multiple doses of IV opiate-based analgesics with persistent symptomatology.  Endorses missing doses of his home Eliquis , 25% of the time.  States he is having difficulty taking the medication twice a day as all his other medications are scheduled daily.  Admitted by [facility], hospitalist service.  ED Course: Temperature 98.3.  BP 95/65, pulse 84, respiratory 17, O2 saturation 91% on 5 L.  Lab studies notable for serum bicarb 21, anion gap 10, serum potassium 3.4, total bilirubin 10.2.  Hemoglobin 8.6, MCV 100.0, platelet count 239.  Review of Systems: Review of systems as noted in the HPI. All other systems reviewed and are negative.   Past  Medical History:  Diagnosis Date   Anxiety    HIV (human immunodeficiency virus infection) (HCC)    Proteinuria    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HCC)    Vitamin D  deficiency 10/2018   Past Surgical History:  Procedure Laterality Date   IR IMAGING GUIDED PORT INSERTION  08/29/2019   IR REMOVAL TUN ACCESS W/ PORT W/O FL MOD SED  08/30/2020   TEE WITHOUT CARDIOVERSION N/A 08/30/2020   Procedure: TRANSESOPHAGEAL ECHOCARDIOGRAM (TEE);  Surgeon: Mona Vinie BROCKS, MD;  Location: [facility] ENDOSCOPY;  Service: Cardiovascular;  Laterality: N/A;    Social History:  reports that he has never smoked. He has never used smokeless tobacco. He reports that he does not currently use drugs after having used the following drugs: Marijuana. He reports that he does not drink alcohol .   Allergies  Allergen Reactions   Ketorolac  Tromethamine  Swelling and Other (See Comments)    Patient reports facial edema and left arm edema after administration.   Tape Rash and Other (See Comments)    PLEASE DO NOT USE THE CLEAR, THICK, PLASTIC TAPE- only paper tape is tolerated    Wound Dressing Adhesive Rash    Family History  Problem Relation Age of Onset   Sickle cell trait Mother    Sickle cell trait Father    Birth defects Maternal Grandmother    Birth defects Paternal Grandmother       Prior to Admission medications   Medication Sig Start Date End Date Taking? Authorizing Provider  abacavir -dolutegravir -lamiVUDine  (TRIUMEQ ) 600-50-300 MG tablet Take 1 tablet by  mouth daily. Patient taking differently: Take 1 tablet by mouth at bedtime. 08/13/23   Pearlean Manus, MD  albuterol  (VENTOLIN  HFA) 108 (90 Base) MCG/ACT inhaler Inhale 2 puffs into the lungs every 6 (six) hours as needed for wheezing or shortness of breath. Patient not taking: Reported on 10/20/2023 08/13/23   Pearlean Manus, MD  apixaban  (ELIQUIS ) 5 MG TABS tablet Take 1 tablet (5 mg total) by mouth 2 (two) times daily. 08/13/23   Pearlean Manus, MD   Cholecalciferol  (VITAMIN D3) 125 MCG (5000 UT) CAPS Take 5,000 Units by mouth every Tuesday.    [provider]  cyclobenzaprine  (FLEXERIL ) 10 MG tablet Take 10 mg by mouth 2 (two) times daily as needed for muscle spasms. 12/22/22   [provider]  Deferasirox  (JADENU ) 360 MG TABS Take 2 tablets (720 mg total) by mouth daily. Patient not taking: Reported on 01/04/2024 11/05/23   [name], [name], [name]  diphenhydrAMINE  (BENADRYL ) 25 MG tablet Take 1 tablet (25 mg total) by mouth every 4 (four) hours as needed for itching. Patient taking differently: Take 50 mg by mouth daily as needed for itching. 08/13/23   Pearlean Manus, MD  folic acid  (FOLVITE ) 400 MCG tablet Take 1 tablet (400 mcg total) by mouth daily. 08/13/23   Pearlean Manus, MD  gabapentin  (NEURONTIN ) 300 MG capsule Take 1 capsule (300 mg total) by mouth at bedtime as needed (nerve pain). Patient not taking: Reported on 01/04/2024 08/13/23   Pearlean Manus, MD  guaiFENesin  (MUCINEX ) 600 MG 12 hr tablet Take 1 tablet (600 mg total) by mouth 2 (two) times daily. Patient not taking: Reported on 01/04/2024 08/13/23 08/12/24  Pearlean Manus, MD  hydroxyurea  (HYDREA ) 500 MG capsule Take 4 capsules (2,000 mg total) by mouth at bedtime. 08/13/23   Pearlean Manus, MD  NARCAN  4 MG/0.1ML LIQD nasal spray kit Place 1 spray into the nose as needed (respiratory distress / opioid overdose). 01/07/23   [provider]  oxyCODONE -acetaminophen  (PERCOCET) 10-325 MG tablet Take 1 tablet by mouth every 4 (four) hours as needed for up to 16 days for pain. 01/15/24 01/31/24  [name], [name], [name]  pantoprazole  (PROTONIX ) 40 MG tablet Take 1 tablet (40 mg total) by mouth daily as needed (acid reflux). Patient not taking: Reported on 01/04/2024 08/13/23   Pearlean Manus, MD  senna-docusate (SENOKOT-S) 8.6-50 MG tablet Take 2 tablets by mouth at bedtime. Patient not taking: Reported on 01/11/2024 08/13/23   Pearlean Manus, MD   triamcinolone  cream (KENALOG ) 0.1 % Apply 1 Application topically 2 (two) times daily. Patient not taking: Reported on 01/04/2024 01/12/23   Tilford Bertram HERO, [name]  Vitamin D , Ergocalciferol , (DRISDOL ) 1.25 MG (50000 UNIT) CAPS capsule Take 1 capsule (50,000 Units total) by mouth every 7 (seven) days. 11/05/23   [name], [name], [name]  Vitamin D , Ergocalciferol , (DRISDOL ) 1.25 MG (50000 UNIT) CAPS capsule Take 1 capsule (50,000 Units total) by mouth every 7 (seven) days. Patient not taking: Reported on 01/04/2024 11/09/23   [name], [name], [name]    Physical Exam: BP 103/74 (BP Location: Right Arm)   Pulse 82   Temp 98.3 F (36.8 C) (Oral)   Resp 18   SpO2 (!) 88%   General: 33 y.o. year-old male well developed well nourished in no acute distress.  Alert and oriented x3. Cardiovascular: Regular rate and rhythm with no rubs or gallops.  No thyromegaly or JVD noted.  No lower extremity edema. 2/4 pulses in all 4 extremities. Respiratory: Clear to auscultation with  no wheezes or rales.  Poor inspiratory effort. Abdomen: Soft nontender nondistended with normal bowel sounds x4 quadrants. Muskuloskeletal: No cyanosis, clubbing or edema noted bilaterally Neuro: CN II-XII intact, strength, sensation, reflexes Skin: No ulcerative lesions noted or rashes Psychiatry: Judgement and insight appear normal. Mood is appropriate for condition and setting          Labs on Admission:  Basic Metabolic Panel: Recent Labs  Lab 01/12/24 0656 01/15/24 0038 01/16/24 0431  NA 140 139 139  K 3.7 3.5 3.4*  CL 109 109 108  CO2 20* 19* 21*  GLUCOSE 95 91 87  BUN 14 7 7   CREATININE 0.83 0.84 1.05  CALCIUM  9.2 8.6* 8.4*   Liver Function Tests: Recent Labs  Lab 01/15/24 0038 01/16/24 0431  AST 31 27  ALT 10 15  ALKPHOS 61 55  BILITOT 10.0* 10.2*  PROT 7.6 7.9  ALBUMIN 4.1 3.7   No results for input(s): LIPASE, AMYLASE in the last 168 hours. No results for input(s): AMMONIA in the last  168 hours. CBC: Recent Labs  Lab 01/12/24 0656 01/15/24 0038 01/16/24 0431  WBC 10.0 11.2* 9.7  NEUTROABS 3.7 5.0 3.9  HGB 8.4* 8.8* 8.6*  HCT 23.7* 24.8* 21.0*  MCV 104.9* 102.9* 100.0  PLT 361 294 239   Cardiac Enzymes: No results for input(s): CKTOTAL, CKMB, CKMBINDEX, TROPONINI in the last 168 hours.  BNP (last 3 results) No results for input(s): BNP in the last 8760 hours.  ProBNP (last 3 results) No results for input(s): PROBNP in the last 8760 hours.  CBG: No results for input(s): GLUCAP in the last 168 hours.  Radiological Exams on Admission: DG Chest Portable 1 View Result Date: 01/16/2024 CLINICAL DATA:  33 year old male with sickle cell disease, hypoxia, small right lower lobe pulmonary embolus on CTA yesterday. Query porta calf placement. EXAM: PORTABLE CHEST 1 VIEW COMPARISON:  CTA chest yesterday. Chest radiographs 0048 hours yesterday. And earlier. FINDINGS: Portable AP semi upright view at 0446 hours. Stable right chest power port, with looped catheter tubing at the thoracic inlet which appears to be within the lumen of the vein at the jugular and subclavian venous confluence when judging from the CTA chest yesterday. This Port-A-Cath has been in place with stable configuration since it was seemingly revised in May of 2023 (see radiographs 09/01/2021, 08/12/2021). Cardiomegaly, mediastinal contours appear stable from yesterday. Larger lung volumes now. No pneumothorax or pleural effusion. Ventilation does appear improved at the right lung base from radiographs yesterday. Otherwise Coarse bilateral pulmonary interstitial opacity is stable. IMPRESSION: 1. Chronic right chest Port-A-Cath with stable configuration since May of 2023 (looped catheter tubing at the thoracic inlet appears to be within the lumen of the jugular and subclavian venous confluence on CT/CTA). 2. Larger lung volumes with improved ventilation at the right lung base from yesterday. Otherwise  stable coarse bilateral pulmonary interstitial opacity, Cardiomegaly. Electronically Signed   By: VEAR Hurst M.D.   On: 01/16/2024 05:10   CT Angio Chest PE W and/or Wo Contrast Addendum Date: 01/15/2024 ADDENDUM REPORT: 01/15/2024 04:48 ADDENDUM: Study discussed by telephone with Dr. Trine in the ED on 01/15/2024 at 0419 hours. Electronically Signed   By: VEAR Hurst M.D.   On: 01/15/2024 04:48   Result Date: 01/15/2024 CLINICAL DATA:  33 year old male with hypoxia.  Sickle cell disease. EXAM: CT ANGIOGRAPHY CHEST WITH CONTRAST TECHNIQUE: Multidetector CT imaging of the chest was performed using the standard protocol during bolus administration of intravenous contrast. Multiplanar CT image  reconstructions and MIPs were obtained to evaluate the vascular anatomy. RADIATION DOSE REDUCTION: This exam was performed according to the departmental dose-optimization program which includes automated exposure control, adjustment of the mA and/or kV according to patient size and/or use of iterative reconstruction technique. CONTRAST:  75mL OMNIPAQUE  IOHEXOL  350 MG/ML SOLN COMPARISON:  Chest radiographs 0048 hours today. CTA chest 01/04/2024 earlier. FINDINGS: Cardiovascular: Good contrast bolus timing in the pulmonary arterial tree. No central or lobar pulmonary artery filling defect. Right lower lobe segmental or subsegmental low-density pulmonary artery filling defect on series 10, image 253 is consistent with pulmonary artery thrombus. But no other pulmonary artery filling defect is identified. Stable mild to moderate cardiomegaly. No pericardial effusion. Negative visible aorta. Right chest Port-A-Cath remains in place. Mediastinum/Nodes: Stable reactive appearing mediastinal and hilar lymph nodes. Lungs/Pleura: Improved lung volumes compared to 01/04/2024. Major airways remain patent, other than trace retained secretions along the anterior wall of the trachea at the thoracic inlet on the right widespread pulmonary mosaic  attenuation with superimposed areas of overt pulmonary scarring and architectural distortion including the lung apices, both lung bases. No pleural effusion. No consolidation. Nor is worsening ventilation compared to 01/04/2024. Upper Abdomen: Small calcified splenic remnant redemonstrated. Negative visible liver, pancreas, adrenal glands, kidneys, and fluid containing stomach in the upper abdomen. Musculoskeletal: Course bone mineralization throughout the visible skeleton. Maintained thoracic vertebral height and endplates. No acute or suspicious osseous lesion. Review of the MIP images confirms the above findings. IMPRESSION: 1. Positive for a small right lower lobe segmental or subsegmental pulmonary embolus. No other pulmonary artery filling defect identified. 2. Sequelae of Sickle Cell Disease including cardiomegaly, splenic atrophy, skeletal changes. Widespread pulmonary mosaic attenuation with scattered scarring also most likely chronic small vessel disease related. No areas of acute opacity or worsening ventilation compared to the CTA 01/04/2024. No pleural effusion. Stable reactive appearing mediastinal lymph nodes. Electronically Signed: By: VEAR Hurst M.D. On: 01/15/2024 04:15   DG Chest 2 View Result Date: 01/15/2024 CLINICAL DATA:  Low O2. EXAM: CHEST - 2 VIEW COMPARISON:  January 03, 2024 FINDINGS: There is stable right-sided venous Port-A-Cath positioning. The heart size and mediastinal contours are within normal limits. Chronic appearing increased interstitial lung markings are seen. Mild, hazy superimposed opacities are seen involving the mid to lower lung fields, bilaterally. This is minimally increased in severity when compared to the prior study. No pleural effusion or pneumothorax is identified. No acute osseous abnormalities are identified. IMPRESSION: Chronic appearing increased interstitial lung markings with additional findings that may represent a superimposed atypical infectious or  inflammatory process involving the mid to lower lung fields, bilaterally. Electronically Signed   By: Suzen Dials M.D.   On: 01/15/2024 01:23    EKG: I independently viewed the EKG done and my findings are as followed: None available at the time of this visit.  Assessment/Plan Present on Admission:  Acute on chronic hypoxic respiratory failure (HCC)  Principal Problem:   Acute on chronic hypoxic respiratory failure (HCC)  Acute on chronic hypoxic respiratory failure secondary to pulmonary embolism, POA At baseline on 4 L as needed oxygen  supplementation Currently requiring 5 L to maintain O2 saturation above 92%. CT angio chest positive for small right lower lobe segmental or subsegmental pulmonary embolism.   Admits to noncompliance with home Eliquis  due to twice daily schedule. Resume anticoagulation with Xarelto. Follow troponin and proBNP for risk stratification. Maintain O2 saturation above 92%. Closely monitor vital signs.  Acute pulmonary embolism secondary to noncompliance  with DOAC Management as stated above TOC consulted for medication assistance  Sickle cell pain crisis Endorses body pain diffusely PCA Dilaudid  pump  Half-normal saline at 100 cc/h x 1 day As needed analgesics, as needed laxatives Resume home regimen, hydroxyurea , folic acid  supplement.  HIV Resume home ART  Underweight BMI 17 Liberalize diet  Hypokalemia Serum potassium 3.4 Replete potassium Obtain magnesium  level Replete electrolytes as indicated  Elevated T bilirubin in the setting of sickle cell pain crisis Continue to treat underlying condition. Repeat CMP in the morning  Non anion gap metabolic acidosis Serum bicarb 21, anion gap 10 Continue IV fluid hydration   Critical care time: 55 minutes.   DVT prophylaxis: Xarelto.  Code Status: Full code.  Family Communication: None at bedside.  Disposition Plan: Admitted to progressive care unit.  Consults called:  None.  Admission status: Inpatient status.   Status is: Inpatient The patient requires at least 2 midnights for further evaluation and treatment of present condition.   Terry LOISE Hurst MD [facility] Pager [phone]  If 7PM-7AM, please contact night-coverage [email or web address] Password [facility]  01/16/2024, 5:33 PM

## 2024-01-16 NOTE — ED Provider Notes (Signed)
  4TH FLOOR PROGRESSIVE CARE AND UROLOGY Provider Note   CSN: [id number] Arrival date & time: 01/16/24  1603     Patient presents with: Chest Pain and Shortness of Breath   Martin [name] is a 33 y.o. male.   HPI    33 year old male with history of PE and noncompliance to his Eliquis , sickle cell anemia, HIV and recent admission for ?  Acute chest syndrome comes in with chief complaint of chest pain, shortness of breath, sickle cell pain.  Patient also has a history of chronic hypoxia, but states that he uses oxygen  at home only as needed, typically only when he is in pain crisis.  Patient was seen in the ER yesterday morning.  He had CT angiogram done which revealed small pulmonary embolism.  He left AGAINST MEDICAL ADVICE when he found out that he will be admitted to [facility] long.  He decided to come to the [facility] to be himself.  Patient is complaining of chest pain, shortness of breath.  His symptoms have been more pronounced in the last 2 days.  Chest pain is worse with breathing.  He also has pain diffusely over his body, that he contributes to sickle cell pain.  Patient admits to missing his Eliquis  on at least 7 days out of 4 months.  He denies any fevers, chills.   Prior to Admission medications   Medication Sig Start Date End Date Taking? Authorizing Provider  abacavir -dolutegravir -lamiVUDine  (TRIUMEQ ) 600-50-300 MG tablet Take 1 tablet by mouth daily. Patient taking differently: Take 1 tablet by mouth at bedtime. 08/13/23   Pearlean Manus, MD  albuterol  (VENTOLIN  HFA) 108 (90 Base) MCG/ACT inhaler Inhale 2 puffs into the lungs every 6 (six) hours as needed for wheezing or shortness of breath. Patient not taking: Reported on 10/20/2023 08/13/23   Pearlean Manus, MD  apixaban  (ELIQUIS ) 5 MG TABS tablet Take 1 tablet (5 mg total) by mouth 2 (two) times daily. 08/13/23   Pearlean Manus, MD  Cholecalciferol  (VITAMIN D3) 125 MCG (5000 UT) CAPS Take 5,000 Units by  mouth every Tuesday.    [provider]  cyclobenzaprine  (FLEXERIL ) 10 MG tablet Take 10 mg by mouth 2 (two) times daily as needed for muscle spasms. 12/22/22   [provider]  Deferasirox  (JADENU ) 360 MG TABS Take 2 tablets (720 mg total) by mouth daily. Patient not taking: Reported on 01/04/2024 11/05/23   [name], [name], [name]  diphenhydrAMINE  (BENADRYL ) 25 MG tablet Take 1 tablet (25 mg total) by mouth every 4 (four) hours as needed for itching. Patient taking differently: Take 50 mg by mouth daily as needed for itching. 08/13/23   Pearlean Manus, MD  folic acid  (FOLVITE ) 400 MCG tablet Take 1 tablet (400 mcg total) by mouth daily. 08/13/23   Pearlean Manus, MD  gabapentin  (NEURONTIN ) 300 MG capsule Take 1 capsule (300 mg total) by mouth at bedtime as needed (nerve pain). Patient not taking: Reported on 01/04/2024 08/13/23   Pearlean Manus, MD  guaiFENesin  (MUCINEX ) 600 MG 12 hr tablet Take 1 tablet (600 mg total) by mouth 2 (two) times daily. Patient not taking: Reported on 01/04/2024 08/13/23 08/12/24  Pearlean Manus, MD  hydroxyurea  (HYDREA ) 500 MG capsule Take 4 capsules (2,000 mg total) by mouth at bedtime. 08/13/23   Pearlean Manus, MD  NARCAN  4 MG/0.1ML LIQD nasal spray kit Place 1 spray into the nose as needed (respiratory distress / opioid overdose). 01/07/23   [provider]  oxyCODONE -acetaminophen  (PERCOCET) 10-325 MG tablet  Take 1 tablet by mouth every 4 (four) hours as needed for up to 16 days for pain. 01/15/24 01/31/24  [name], [name], [name]  pantoprazole  (PROTONIX ) 40 MG tablet Take 1 tablet (40 mg total) by mouth daily as needed (acid reflux). Patient not taking: Reported on 01/04/2024 08/13/23   Pearlean Manus, MD  senna-docusate (SENOKOT-S) 8.6-50 MG tablet Take 2 tablets by mouth at bedtime. Patient not taking: Reported on 01/11/2024 08/13/23   Pearlean Manus, MD  triamcinolone  cream (KENALOG ) 0.1 % Apply 1 Application topically 2 (two) times  daily. Patient not taking: Reported on 01/04/2024 01/12/23   Tilford Bertram HERO, [name]  Vitamin D , Ergocalciferol , (DRISDOL ) 1.25 MG (50000 UNIT) CAPS capsule Take 1 capsule (50,000 Units total) by mouth every 7 (seven) days. 11/05/23   [name], [name], [name]  Vitamin D , Ergocalciferol , (DRISDOL ) 1.25 MG (50000 UNIT) CAPS capsule Take 1 capsule (50,000 Units total) by mouth every 7 (seven) days. Patient not taking: Reported on 01/04/2024 11/09/23   [name], [name], [name]    Allergies: Ketorolac  tromethamine , Tape, and Wound dressing adhesive    Review of Systems  All other systems reviewed and are negative.   Updated Vital Signs BP 114/79 (BP Location: Left Arm)   Pulse 91   Temp 98.4 F (36.9 C) (Oral)   Resp 18   Ht 6' 3 (1.905 m)   Wt 63.6 kg   SpO2 92%   BMI 17.53 kg/m   Physical Exam Vitals and nursing note reviewed.  Constitutional:      Appearance: He is well-developed.  HENT:     Head: Atraumatic.  Cardiovascular:     Rate and Rhythm: Normal rate.  Pulmonary:     Effort: Pulmonary effort is normal.  Musculoskeletal:     Cervical back: Neck supple.  Skin:    General: Skin is warm.  Neurological:     Mental Status: He is alert and oriented to person, place, and time.     (all labs ordered are listed, but only abnormal results are displayed) Labs Reviewed  PRO BRAIN NATRIURETIC PEPTIDE - Abnormal; Notable for the following components:      Result Value   Pro Brain Natriuretic Peptide 2,143.0 (*)    All other components within normal limits  HEPARIN  LEVEL (UNFRACTIONATED) - Abnormal; Notable for the following components:   Heparin  Unfractionated 1.03 (*)    All other components within normal limits  APTT - Abnormal; Notable for the following components:   aPTT 39 (*)    All other components within normal limits  CBC  COMPREHENSIVE METABOLIC PANEL WITH GFR  MAGNESIUM   PHOSPHORUS  TROPONIN T, HIGH SENSITIVITY  TROPONIN T, HIGH SENSITIVITY     EKG: None  Radiology: DG Chest Portable 1 View Result Date: 01/16/2024 CLINICAL DATA:  33 year old male with sickle cell disease, hypoxia, small right lower lobe pulmonary embolus on CTA yesterday. Query porta calf placement. EXAM: PORTABLE CHEST 1 VIEW COMPARISON:  CTA chest yesterday. Chest radiographs 0048 hours yesterday. And earlier. FINDINGS: Portable AP semi upright view at 0446 hours. Stable right chest power port, with looped catheter tubing at the thoracic inlet which appears to be within the lumen of the vein at the jugular and subclavian venous confluence when judging from the CTA chest yesterday. This Port-A-Cath has been in place with stable configuration since it was seemingly revised in May of 2023 (see radiographs 09/01/2021, 08/12/2021). Cardiomegaly, mediastinal contours appear stable from yesterday. Larger lung volumes now. No pneumothorax or pleural effusion. Ventilation  does appear improved at the right lung base from radiographs yesterday. Otherwise Coarse bilateral pulmonary interstitial opacity is stable. IMPRESSION: 1. Chronic right chest Port-A-Cath with stable configuration since May of 2023 (looped catheter tubing at the thoracic inlet appears to be within the lumen of the jugular and subclavian venous confluence on CT/CTA). 2. Larger lung volumes with improved ventilation at the right lung base from yesterday. Otherwise stable coarse bilateral pulmonary interstitial opacity, Cardiomegaly. Electronically Signed   By: VEAR Hurst M.D.   On: 01/16/2024 05:10   CT Angio Chest PE W and/or Wo Contrast Addendum Date: 01/15/2024 ADDENDUM REPORT: 01/15/2024 04:48 ADDENDUM: Study discussed by telephone with Dr. Trine in the ED on 01/15/2024 at 0419 hours. Electronically Signed   By: VEAR Hurst M.D.   On: 01/15/2024 04:48   Result Date: 01/15/2024 CLINICAL DATA:  33 year old male with hypoxia.  Sickle cell disease. EXAM: CT ANGIOGRAPHY CHEST WITH CONTRAST TECHNIQUE: Multidetector CT  imaging of the chest was performed using the standard protocol during bolus administration of intravenous contrast. Multiplanar CT image reconstructions and MIPs were obtained to evaluate the vascular anatomy. RADIATION DOSE REDUCTION: This exam was performed according to the departmental dose-optimization program which includes automated exposure control, adjustment of the mA and/or kV according to patient size and/or use of iterative reconstruction technique. CONTRAST:  75mL OMNIPAQUE  IOHEXOL  350 MG/ML SOLN COMPARISON:  Chest radiographs 0048 hours today. CTA chest 01/04/2024 earlier. FINDINGS: Cardiovascular: Good contrast bolus timing in the pulmonary arterial tree. No central or lobar pulmonary artery filling defect. Right lower lobe segmental or subsegmental low-density pulmonary artery filling defect on series 10, image 253 is consistent with pulmonary artery thrombus. But no other pulmonary artery filling defect is identified. Stable mild to moderate cardiomegaly. No pericardial effusion. Negative visible aorta. Right chest Port-A-Cath remains in place. Mediastinum/Nodes: Stable reactive appearing mediastinal and hilar lymph nodes. Lungs/Pleura: Improved lung volumes compared to 01/04/2024. Major airways remain patent, other than trace retained secretions along the anterior wall of the trachea at the thoracic inlet on the right widespread pulmonary mosaic attenuation with superimposed areas of overt pulmonary scarring and architectural distortion including the lung apices, both lung bases. No pleural effusion. No consolidation. Nor is worsening ventilation compared to 01/04/2024. Upper Abdomen: Small calcified splenic remnant redemonstrated. Negative visible liver, pancreas, adrenal glands, kidneys, and fluid containing stomach in the upper abdomen. Musculoskeletal: Course bone mineralization throughout the visible skeleton. Maintained thoracic vertebral height and endplates. No acute or suspicious osseous  lesion. Review of the MIP images confirms the above findings. IMPRESSION: 1. Positive for a small right lower lobe segmental or subsegmental pulmonary embolus. No other pulmonary artery filling defect identified. 2. Sequelae of Sickle Cell Disease including cardiomegaly, splenic atrophy, skeletal changes. Widespread pulmonary mosaic attenuation with scattered scarring also most likely chronic small vessel disease related. No areas of acute opacity or worsening ventilation compared to the CTA 01/04/2024. No pleural effusion. Stable reactive appearing mediastinal lymph nodes. Electronically Signed: By: VEAR Hurst M.D. On: 01/15/2024 04:15   DG Chest 2 View Result Date: 01/15/2024 CLINICAL DATA:  Low O2. EXAM: CHEST - 2 VIEW COMPARISON:  January 03, 2024 FINDINGS: There is stable right-sided venous Port-A-Cath positioning. The heart size and mediastinal contours are within normal limits. Chronic appearing increased interstitial lung markings are seen. Mild, hazy superimposed opacities are seen involving the mid to lower lung fields, bilaterally. This is minimally increased in severity when compared to the prior study. No pleural effusion or pneumothorax is identified. No  acute osseous abnormalities are identified. IMPRESSION: Chronic appearing increased interstitial lung markings with additional findings that may represent a superimposed atypical infectious or inflammatory process involving the mid to lower lung fields, bilaterally. Electronically Signed   By: Suzen Dials M.D.   On: 01/15/2024 01:23     .Critical Care  Performed by: Charlyn Sora, MD Authorized by: Charlyn Sora, MD   Critical care provider statement:    Critical care time (minutes):  35   Critical care was necessary to treat or prevent imminent or life-threatening deterioration of the following conditions:  Respiratory failure   Critical care was time spent personally by me on the following activities:  Development of treatment  plan with patient or surrogate, discussions with consultants, evaluation of patient's response to treatment, examination of patient, ordering and review of laboratory studies, ordering and review of radiographic studies, ordering and performing treatments and interventions, pulse oximetry, re-evaluation of patient's condition, review of old charts and obtaining history from patient or surrogate    Medications Ordered in the ED  HYDROmorphone  (DILAUDID ) injection 2 mg (2 mg Intravenous Not Given 01/16/24 2011)  ondansetron  (ZOFRAN ) injection 4 mg (has no administration in time range)  0.45 % sodium chloride  infusion ( Intravenous New Bag/Given 01/16/24 1816)  abacavir -dolutegravir -lamiVUDine  (TRIUMEQ ) 600-50-300 MG per tablet 1 tablet (1 tablet Oral Given 01/16/24 2238)  folic acid  (FOLVITE ) tablet 0.5 mg (has no administration in time range)  hydroxyurea  (HYDREA ) capsule 2,000 mg (2,000 mg Oral Given 01/16/24 2238)  naloxone  (NARCAN ) injection 0.4 mg (has no administration in time range)    And  sodium chloride  flush (NS) 0.9 % injection 9 mL (has no administration in time range)  diphenhydrAMINE  (BENADRYL ) capsule 25 mg (has no administration in time range)  HYDROmorphone  (DILAUDID ) 1 mg/mL PCA injection (30 mg Intravenous Set-up / Initial Syringe 01/16/24 2221)  Rivaroxaban (XARELTO) tablet 15 mg (has no administration in time range)    Followed by  rivaroxaban (XARELTO) tablet 20 mg (has no administration in time range)  sodium chloride  flush (NS) 0.9 % injection 10-40 mL (has no administration in time range)  Chlorhexidine  Gluconate Cloth 2 % PADS 6 each (6 each Topical Given 01/16/24 2010)  oxyCODONE -acetaminophen  (PERCOCET/ROXICET) 5-325 MG per tablet 1 tablet (has no administration in time range)    And  oxyCODONE  (Oxy IR/ROXICODONE ) immediate release tablet 5 mg (has no administration in time range)  HYDROmorphone  (DILAUDID ) injection 2 mg (2 mg Intravenous Given 01/16/24 1813)   HYDROmorphone  (DILAUDID ) injection 2 mg (2 mg Intravenous Given 01/16/24 1917)  oxyCODONE  (Oxy IR/ROXICODONE ) immediate release tablet 15 mg (15 mg Oral Given 01/16/24 2009)  diphenhydrAMINE  (BENADRYL ) injection 12.5 mg (12.5 mg Intravenous Given 01/16/24 1813)  potassium chloride  SA (KLOR-CON  M) CR tablet 20 mEq (20 mEq Oral Given 01/16/24 2238)  Rivaroxaban (XARELTO) tablet 15 mg (15 mg Oral Given 01/16/24 2008)                                    Medical Decision Making Risk Prescription drug management. Decision regarding hospitalization.   33 year old male comes in with chief complaint of shortness of breath, chest pain.  He was seen in the emergency room earlier in the morning, he left AMA.  He was diagnosed with pulmonary embolism without right-sided heart strain at that time.  I reviewed patient's records including recent discharge summary.  I have also reviewed patient's echocardiogram, it appears that patient has  chronic hypoxic respiratory failure with as needed oxygen  requirement and he has pulmonary hypertension.  He also admits to medication noncompliance.  On exam, patient has no respiratory distress.  However his O2 sats dropped into the 70s on room air.  I placed him on 6 L to keep O2 sats over 94%.  He is comfortable appearing.  I reviewed the CT scan from earlier this morning and also last week. Clinically does not appear that his acute chest syndrome.  Will admit him to medicine service.  BNP ordered.  Not adding troponin as patient has no evidence of right-sided heart strain, and anticipate the troponin to be elevated given his chronic comorbidities, hypoxia.  Final diagnoses:  Acute on chronic hypoxic respiratory failure (HCC)  Single subsegmental pulmonary embolism without [facility])    ED Discharge Orders     None          Charlyn Sora, MD 01/16/24 2252

## 2024-01-16 NOTE — Progress Notes (Signed)
 PHARMACY - ANTICOAGULATION CONSULT NOTE  Pharmacy Consult for Heparin  Indication: pulmonary embolus  Allergies  Allergen Reactions   Ketorolac  Tromethamine  Swelling and Other (See Comments)    Patient reports facial edema and left arm edema after administration.   Tape Rash and Other (See Comments)    PLEASE DO NOT USE THE CLEAR, THICK, PLASTIC TAPE- only paper tape is tolerated    Wound Dressing Adhesive Rash    Patient Measurements:    Vital Signs: Temp: 98.3 F (36.8 C) (09/28 1613) Temp Source: Oral (09/28 1613) BP: 103/74 (09/28 1630) Pulse Rate: 82 (09/28 1630)  Labs: Recent Labs    01/15/24 0038 01/16/24 0431  HGB 8.8* 8.6*  HCT 24.8* 21.0*  PLT 294 239  CREATININE 0.84 1.05    Estimated Creatinine Clearance: 90 mL/min (by C-G formula based on SCr of 1.05 mg/dL).   Medical History: Past Medical History:  Diagnosis Date   Anxiety    HIV (human immunodeficiency virus infection) (HCC)    Proteinuria    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HCC)    Vitamin D  deficiency 10/2018    Assessment: Active Problem(s): CP, SOB, sickle cell pain,   AC/Heme: PE. Supposed to be on Eliquis  - 9/16: CTA chest with no evidence of PE.  - 9/27: CT: Positive for a small right lower lobe segmental or subsegmental pulmonary embolus - MD notes intermittent compliance with Eliquis . Last filled in April. - Hgb 8.6 (at his baseline), Plts 239  Goal of Therapy:  Therapeutic oral anticoagulation  Plan:  Xarelto 15mg  BID x 7d then 20mg  daily   Ashlay Altieri Karoline Marina, PharmD, BCPS Clinical Staff Pharmacist Marina Salines [facility] 01/16/2024,6:40 PM

## 2024-01-16 NOTE — ED Triage Notes (Addendum)
 Patient arrives POV c/o sickle cell crisis that started around 2330 last night; patient reports taking home pain medication with no improvement. States pain is 10/10. Patient states he normally wears 3L O2 at home; O2 sat 84% on room air. Pt denies chest pain or SHOB.

## 2024-01-16 NOTE — ED Provider Notes (Signed)
 [place] [facility]MENT AT [facility] Provider Note   CSN: [id number] Arrival date & time: 01/16/24  0345     Patient presents with: Sickle Cell Pain Crisis   Martin [name] is a 33 y.o. male.    Sickle Cell Pain Crisis Associated symptoms: no fever    Patient with history of sickle cell, HIV presents with ongoing pain crisis.  He reports pain throughout his body.  Mostly in his back and legs.  Similar to prior episodes.  It is not relieved by pain medicine at home.  Denies any increased chest pain or shortness of breath.  No new fevers.  Patient reports he is on chronic oxygen  at home    Prior to Admission medications   Medication Sig Start Date End Date Taking? Authorizing Provider  abacavir -dolutegravir -lamiVUDine  (TRIUMEQ ) 600-50-300 MG tablet Take 1 tablet by mouth daily. Patient taking differently: Take 1 tablet by mouth at bedtime. 08/13/23   Pearlean Manus, MD  albuterol  (VENTOLIN  HFA) 108 (90 Base) MCG/ACT inhaler Inhale 2 puffs into the lungs every 6 (six) hours as needed for wheezing or shortness of breath. Patient not taking: Reported on 10/20/2023 08/13/23   Pearlean Manus, MD  apixaban  (ELIQUIS ) 5 MG TABS tablet Take 1 tablet (5 mg total) by mouth 2 (two) times daily. 08/13/23   Pearlean Manus, MD  Cholecalciferol  (VITAMIN D3) 125 MCG (5000 UT) CAPS Take 5,000 Units by mouth every Tuesday.    [provider]  cyclobenzaprine  (FLEXERIL ) 10 MG tablet Take 10 mg by mouth 2 (two) times daily as needed for muscle spasms. 12/22/22   [provider]  Deferasirox  (JADENU ) 360 MG TABS Take 2 tablets (720 mg total) by mouth daily. Patient not taking: Reported on 01/04/2024 11/05/23   [name], [name], [name]  diphenhydrAMINE  (BENADRYL ) 25 MG tablet Take 1 tablet (25 mg total) by mouth every 4 (four) hours as needed for itching. Patient taking differently: Take 50 mg by mouth daily as needed for itching. 08/13/23   Pearlean Manus, MD  folic  acid (FOLVITE ) 400 MCG tablet Take 1 tablet (400 mcg total) by mouth daily. 08/13/23   Pearlean Manus, MD  gabapentin  (NEURONTIN ) 300 MG capsule Take 1 capsule (300 mg total) by mouth at bedtime as needed (nerve pain). Patient not taking: Reported on 01/04/2024 08/13/23   Pearlean Manus, MD  guaiFENesin  (MUCINEX ) 600 MG 12 hr tablet Take 1 tablet (600 mg total) by mouth 2 (two) times daily. Patient not taking: Reported on 01/04/2024 08/13/23 08/12/24  Pearlean Manus, MD  hydroxyurea  (HYDREA ) 500 MG capsule Take 4 capsules (2,000 mg total) by mouth at bedtime. 08/13/23   Pearlean Manus, MD  NARCAN  4 MG/0.1ML LIQD nasal spray kit Place 1 spray into the nose as needed (respiratory distress / opioid overdose). 01/07/23   [provider]  oxyCODONE -acetaminophen  (PERCOCET) 10-325 MG tablet Take 1 tablet by mouth every 4 (four) hours as needed for up to 16 days for pain. 01/15/24 01/31/24  [name], [name], [name]  pantoprazole  (PROTONIX ) 40 MG tablet Take 1 tablet (40 mg total) by mouth daily as needed (acid reflux). Patient not taking: Reported on 01/04/2024 08/13/23   Pearlean Manus, MD  senna-docusate (SENOKOT-S) 8.6-50 MG tablet Take 2 tablets by mouth at bedtime. Patient not taking: Reported on 01/11/2024 08/13/23   Pearlean Manus, MD  triamcinolone  cream (KENALOG ) 0.1 % Apply 1 Application topically 2 (two) times daily. Patient not taking: Reported on 01/04/2024 01/12/23   Tilford Bertram HERO, [name]  Vitamin  D, Ergocalciferol , (DRISDOL ) 1.25 MG (50000 UNIT) CAPS capsule Take 1 capsule (50,000 Units total) by mouth every 7 (seven) days. 11/05/23   [name], [name], [name]  Vitamin D , Ergocalciferol , (DRISDOL ) 1.25 MG (50000 UNIT) CAPS capsule Take 1 capsule (50,000 Units total) by mouth every 7 (seven) days. Patient not taking: Reported on 01/04/2024 11/09/23   [name], [name], [name]    Allergies: Ketorolac  tromethamine , Tape, and Wound dressing adhesive    Review of Systems  Constitutional:   Negative for fever.  Musculoskeletal:  Positive for arthralgias and myalgias.    Updated Vital Signs BP 108/69   Pulse 88   Temp 98 F (36.7 C)   Resp 14   Ht 1.905 m (6' 3)   Wt 63.6 kg   SpO2 94%   BMI 17.53 kg/m   Physical Exam CONSTITUTIONAL: Well developed/well nourished, uncomfortable appearing HEAD: Normocephalic/atraumatic ENMT: Mucous membranes moist NECK: supple no meningeal signs CV: S1/S2 noted, no murmurs/rubs/gallops noted LUNGS: Lungs are clear to auscultation bilaterally, no apparent distress ABDOMEN: soft, nontender NEURO: Pt is awake/alert/appropriate, moves all extremitiesx4.  No facial droop.   EXTREMITIES: pulses normal/equal, full ROM, no lower extremity edema No joint effusions SKIN: warm, color normal PSYCH: no abnormalities of mood noted, alert and oriented to situation  (all labs ordered are listed, but only abnormal results are displayed) Labs Reviewed  COMPREHENSIVE METABOLIC PANEL WITH GFR - Abnormal; Notable for the following components:      Result Value   Potassium 3.4 (*)    CO2 21 (*)    Calcium  8.4 (*)    Total Bilirubin 10.2 (*)    All other components within normal limits  CBC WITH DIFFERENTIAL/PLATELET - Abnormal; Notable for the following components:   RBC 2.05 (*)    Hemoglobin 8.6 (*)    HCT 21.0 (*)    MCH 36.5 (*)    MCHC 36.5 (*)    RDW 29.1 (*)    nRBC 68.0 (*)    Lymphs Abs 4.6 (*)    All other components within normal limits  RETICULOCYTES - Abnormal; Notable for the following components:   Retic Ct Pct 29.4 (*)    RBC. 2.55 (*)    Retic Count, Absolute 749.0 (*)    Immature Retic Fract 25.0 (*)    All other components within normal limits  HEPARIN  LEVEL (UNFRACTIONATED)    EKG: None  Radiology: DG Chest Portable 1 View Result Date: 01/16/2024 CLINICAL DATA:  33 year old male with sickle cell disease, hypoxia, small right lower lobe pulmonary embolus on CTA yesterday. Query porta calf placement. EXAM:  PORTABLE CHEST 1 VIEW COMPARISON:  CTA chest yesterday. Chest radiographs 0048 hours yesterday. And earlier. FINDINGS: Portable AP semi upright view at 0446 hours. Stable right chest power port, with looped catheter tubing at the thoracic inlet which appears to be within the lumen of the vein at the jugular and subclavian venous confluence when judging from the CTA chest yesterday. This Port-A-Cath has been in place with stable configuration since it was seemingly revised in May of 2023 (see radiographs 09/01/2021, 08/12/2021). Cardiomegaly, mediastinal contours appear stable from yesterday. Larger lung volumes now. No pneumothorax or pleural effusion. Ventilation does appear improved at the right lung base from radiographs yesterday. Otherwise Coarse bilateral pulmonary interstitial opacity is stable. IMPRESSION: 1. Chronic right chest Port-A-Cath with stable configuration since May of 2023 (looped catheter tubing at the thoracic inlet appears to be within the lumen of the jugular and subclavian venous confluence on  CT/CTA). 2. Larger lung volumes with improved ventilation at the right lung base from yesterday. Otherwise stable coarse bilateral pulmonary interstitial opacity, Cardiomegaly. Electronically Signed   By: VEAR Hurst M.D.   On: 01/16/2024 05:10   CT Angio Chest PE W and/or Wo Contrast Addendum Date: 01/15/2024 ADDENDUM REPORT: 01/15/2024 04:48 ADDENDUM: Study discussed by telephone with Dr. Trine in the ED on 01/15/2024 at 0419 hours. Electronically Signed   By: VEAR Hurst M.D.   On: 01/15/2024 04:48   Result Date: 01/15/2024 CLINICAL DATA:  33 year old male with hypoxia.  Sickle cell disease. EXAM: CT ANGIOGRAPHY CHEST WITH CONTRAST TECHNIQUE: Multidetector CT imaging of the chest was performed using the standard protocol during bolus administration of intravenous contrast. Multiplanar CT image reconstructions and MIPs were obtained to evaluate the vascular anatomy. RADIATION DOSE REDUCTION: This exam  was performed according to the departmental dose-optimization program which includes automated exposure control, adjustment of the mA and/or kV according to patient size and/or use of iterative reconstruction technique. CONTRAST:  75mL OMNIPAQUE  IOHEXOL  350 MG/ML SOLN COMPARISON:  Chest radiographs 0048 hours today. CTA chest 01/04/2024 earlier. FINDINGS: Cardiovascular: Good contrast bolus timing in the pulmonary arterial tree. No central or lobar pulmonary artery filling defect. Right lower lobe segmental or subsegmental low-density pulmonary artery filling defect on series 10, image 253 is consistent with pulmonary artery thrombus. But no other pulmonary artery filling defect is identified. Stable mild to moderate cardiomegaly. No pericardial effusion. Negative visible aorta. Right chest Port-A-Cath remains in place. Mediastinum/Nodes: Stable reactive appearing mediastinal and hilar lymph nodes. Lungs/Pleura: Improved lung volumes compared to 01/04/2024. Major airways remain patent, other than trace retained secretions along the anterior wall of the trachea at the thoracic inlet on the right widespread pulmonary mosaic attenuation with superimposed areas of overt pulmonary scarring and architectural distortion including the lung apices, both lung bases. No pleural effusion. No consolidation. Nor is worsening ventilation compared to 01/04/2024. Upper Abdomen: Small calcified splenic remnant redemonstrated. Negative visible liver, pancreas, adrenal glands, kidneys, and fluid containing stomach in the upper abdomen. Musculoskeletal: Course bone mineralization throughout the visible skeleton. Maintained thoracic vertebral height and endplates. No acute or suspicious osseous lesion. Review of the MIP images confirms the above findings. IMPRESSION: 1. Positive for a small right lower lobe segmental or subsegmental pulmonary embolus. No other pulmonary artery filling defect identified. 2. Sequelae of Sickle Cell Disease  including cardiomegaly, splenic atrophy, skeletal changes. Widespread pulmonary mosaic attenuation with scattered scarring also most likely chronic small vessel disease related. No areas of acute opacity or worsening ventilation compared to the CTA 01/04/2024. No pleural effusion. Stable reactive appearing mediastinal lymph nodes. Electronically Signed: By: VEAR Hurst M.D. On: 01/15/2024 04:15   DG Chest 2 View Result Date: 01/15/2024 CLINICAL DATA:  Low O2. EXAM: CHEST - 2 VIEW COMPARISON:  January 03, 2024 FINDINGS: There is stable right-sided venous Port-A-Cath positioning. The heart size and mediastinal contours are within normal limits. Chronic appearing increased interstitial lung markings are seen. Mild, hazy superimposed opacities are seen involving the mid to lower lung fields, bilaterally. This is minimally increased in severity when compared to the prior study. No pleural effusion or pneumothorax is identified. No acute osseous abnormalities are identified. IMPRESSION: Chronic appearing increased interstitial lung markings with additional findings that may represent a superimposed atypical infectious or inflammatory process involving the mid to lower lung fields, bilaterally. Electronically Signed   By: Suzen Dials M.D.   On: 01/15/2024 01:23     Procedures  Medications Ordered in the ED  naloxone  (NARCAN ) injection 0.4 mg (has no administration in time range)    And  sodium chloride  flush (NS) 0.9 % injection 9 mL (has no administration in time range)  HYDROmorphone  (DILAUDID ) 1 mg/mL PCA injection (has no administration in time range)  abacavir -dolutegravir -lamiVUDine  (TRIUMEQ ) 600-50-300 MG per tablet 1 tablet (has no administration in time range)  oxyCODONE -acetaminophen  (PERCOCET) 10-325 MG per tablet 1 tablet (has no administration in time range)  pantoprazole  (PROTONIX ) EC tablet 40 mg (has no administration in time range)  senna-docusate (Senokot-S) tablet 2 tablet (has no  administration in time range)  folic acid  (FOLVITE ) tablet 0.5 mg (has no administration in time range)  albuterol  (PROVENTIL ) (2.5 MG/3ML) 0.083% nebulizer solution 3 mL (has no administration in time range)  sodium chloride  flush (NS) 0.9 % injection 3 mL (has no administration in time range)  acetaminophen  (OFIRMEV ) IV 1,000 mg (has no administration in time range)  lactated ringers  infusion (has no administration in time range)  sodium chloride  flush (NS) 0.9 % injection 3 mL (has no administration in time range)  sodium chloride  flush (NS) 0.9 % injection 3 mL (has no administration in time range)  0.9 %  sodium chloride  infusion (has no administration in time range)  acetaminophen  (TYLENOL ) tablet 650 mg (has no administration in time range)    Or  acetaminophen  (TYLENOL ) suppository 650 mg (has no administration in time range)  ondansetron  (ZOFRAN ) injection 4 mg (has no administration in time range)  diphenhydrAMINE  (BENADRYL ) injection 25 mg (has no administration in time range)  lactated ringers  bolus 1,000 mL (has no administration in time range)  sodium chloride  0.9 % bolus 1,000 mL (0 mLs Intravenous Stopped 01/16/24 0612)  HYDROmorphone  (DILAUDID ) injection 2 mg (2 mg Intravenous Given 01/16/24 0518)  HYDROmorphone  (DILAUDID ) injection 2 mg (2 mg Intravenous Given 01/16/24 0545)  HYDROmorphone  (DILAUDID ) injection 2 mg (2 mg Intravenous Given 01/16/24 0613)    Clinical Course as of 01/16/24 0623  Sun Jan 16, 2024  0446 Patient presents with ongoing sickle cell pain crisis.  Patient is oxygen  dependent at home.  He was just seen in the ER recently and was found to have a PE in the setting of chronic anticoagulation.  Patient refused admission at that time, but he is now willing to be admitted [DW]    Clinical Course User Index [DW] Midge Golas, MD                                 Medical Decision Making Amount and/or Complexity of Data Reviewed Labs: ordered. Radiology:  ordered.  Risk Prescription drug management.   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 I called report to Dr. Sundil When I informed patient that he would likely be transferred to [facility] long hospital for admission, he demanded to be discharged AMA I advised that it would be safe for him to stay in the ER, get pain treatment and transferred I asked him to wait so I could take a phone call, and soon after he left as he had family waiting on him     Final diagnoses:  Sickle cell crisis [facility])  Other acute pulmonary embolism, unspecified whether acute cor pulmonale present [facility])    ED Discharge Orders  None          Midge Golas, MD 01/16/24 [phone]

## 2024-01-16 NOTE — ED Triage Notes (Signed)
 C/o CP, SOB< and sickle cell pain. Dc'ed from the ER less than 48 hours ago and told he had a blood clot in his lungs but no symptoms.

## 2024-01-16 NOTE — ED Notes (Signed)
 Patient requested to deaccess his porta cath , left AMA , EDP notified . He refused to sign his AMA .

## 2024-01-16 NOTE — Plan of Care (Addendum)
 Martin [name] is a 33 y.o. male with medical history significant of sickle cell pain disease, history of pulmonary embolism, chronic hypoxic respiratory failure 2 L oxygen  baseline, HIV, generalized anxiety disorder and history of recurrent hospital admission for sickle cell pain crisis presented emergency department complaining of sickle cell pain for 1 day which is not improving with pain medications at home. Patient has intermittent compliance with Eliquis .   ED Course:  At presentation to ED patient O2 sat 85% room air which has been improved to 94%.  Initial blood pressure borderline soft 92/59 improved to 108/69. CBC showing normal WBC count, stable H&H 8.6 and 21 and normal platelet count 239. CMP showing low potassium 3.4, chronic elevated bilirubin 10.2.  Chest x-ray showing: IMPRESSION: 1. Chronic right chest Port-A-Cath with stable configuration since May of 2023 (looped catheter tubing at the thoracic inlet appears to be within the lumen of the jugular and subclavian venous confluence on CT/CTA).  2. Larger lung volumes with improved ventilation at the right lung base from yesterday. Otherwise stable coarse bilateral pulmonary interstitial opacity, Cardiomegaly.   In the ED patient received multiple doses of Benadryl , Zofran  and 1 L of NS bolus.  Patient has a CTA chest which is showing positive for small right lower lobe PE. Patient has history of PE and previous CTA chest 9/16 is no evidence of PE.  Hospitalist has been consulted for further evaluation management of sickle cell pain crisis and acute on chronic development of pulmonary embolism.   Patient left AMA from [facility] ED and stated to bedside nurse that he is going to go to [facility] long hospital.

## 2024-01-17 ENCOUNTER — Encounter (HOSPITAL_COMMUNITY): Payer: Self-pay | Admitting: Internal Medicine

## 2024-01-17 ENCOUNTER — Other Ambulatory Visit (HOSPITAL_COMMUNITY): Payer: Self-pay

## 2024-01-17 LAB — COMPREHENSIVE METABOLIC PANEL WITH GFR
ALT: 22 U/L (ref 0–44)
AST: 42 U/L — ABNORMAL HIGH (ref 15–41)
Albumin: 3.8 g/dL (ref 3.5–5.0)
Alkaline Phosphatase: 57 U/L (ref 38–126)
Anion gap: 11 (ref 5–15)
BUN: 8 mg/dL (ref 6–20)
CO2: 19 mmol/L — ABNORMAL LOW (ref 22–32)
Calcium: 8.1 mg/dL — ABNORMAL LOW (ref 8.9–10.3)
Chloride: 108 mmol/L (ref 98–111)
Creatinine, Ser: 0.86 mg/dL (ref 0.61–1.24)
GFR, Estimated: >60 mL/min (ref >60)
Glucose, Bld: 87 mg/dL (ref 70–99)
Potassium: 4.2 mmol/L (ref 3.5–5.1)
Sodium: 138 mmol/L (ref 135–145)
Total Bilirubin: 6.5 mg/dL — ABNORMAL HIGH (ref 0.0–1.2)
Total Protein: 6.9 g/dL (ref 6.5–8.1)

## 2024-01-17 LAB — PHOSPHORUS: Phosphorus: 4.6 mg/dL (ref 2.5–4.6)

## 2024-01-17 LAB — CBC
HCT: 22 % — ABNORMAL LOW (ref 39.0–52.0)
Hemoglobin: 7.6 g/dL — ABNORMAL LOW (ref 13.0–17.0)
MCH: 35 pg — ABNORMAL HIGH (ref 26.0–34.0)
MCHC: 34.5 g/dL (ref 30.0–36.0)
MCV: 101.4 fL — ABNORMAL HIGH (ref 80.0–100.0)
Platelets: 214 K/uL (ref 150–400)
RBC: 2.17 MIL/uL — ABNORMAL LOW (ref 4.22–5.81)
RDW: 27.4 % — ABNORMAL HIGH (ref 11.5–15.5)
WBC: 12.2 K/uL — ABNORMAL HIGH (ref 4.0–10.5)
nRBC: 31.2 % — ABNORMAL HIGH (ref 0.0–0.2)

## 2024-01-17 LAB — MAGNESIUM: Magnesium: 1.9 mg/dL (ref 1.7–2.4)

## 2024-01-17 LAB — TROPONIN T, HIGH SENSITIVITY: Troponin T High Sensitivity: 25 ng/L — ABNORMAL HIGH (ref 0–19)

## 2024-01-17 MED ORDER — HYDROMORPHONE 1 MG/ML IV SOLN
INTRAVENOUS | Status: DC
  Administered 2024-01-17: 30 mg via INTRAVENOUS
  Administered 2024-01-17: 12 mg via INTRAVENOUS
  Administered 2024-01-17: 10 mg via INTRAVENOUS
  Administered 2024-01-18: 5 mg via INTRAVENOUS
  Administered 2024-01-18: 5.5 mg via INTRAVENOUS
  Administered 2024-01-18: 2.5 mg via INTRAVENOUS
  Administered 2024-01-18 (×2): 6.5 mg via INTRAVENOUS
  Administered 2024-01-18: 30 mg via INTRAVENOUS
  Administered 2024-01-18: 7 mg via INTRAVENOUS
  Administered 2024-01-19: 3.5 mg via INTRAVENOUS
  Administered 2024-01-19: 30 mg via INTRAVENOUS
  Administered 2024-01-19: 5.5 mg via INTRAVENOUS
  Administered 2024-01-19: 7.5 mg via INTRAVENOUS
  Administered 2024-01-19: 5 mg via INTRAVENOUS
  Administered 2024-01-19: 4 mg via INTRAVENOUS
  Administered 2024-01-20: 30 mg via INTRAVENOUS
  Administered 2024-01-20: 3 mg via INTRAVENOUS
  Administered 2024-01-20 (×2): 6.5 mg via INTRAVENOUS
  Administered 2024-01-21: 0.5 mg via INTRAVENOUS
  Administered 2024-01-21: 3 mg via INTRAVENOUS
  Administered 2024-01-21: 7 mg via INTRAVENOUS
  Filled 2024-01-17 (×4): qty 30

## 2024-01-17 MED ORDER — RIVAROXABAN 20 MG PO TABS: 20.0000 mg | ORAL_TABLET | Freq: Every day | ORAL | Status: DC

## 2024-01-17 MED ORDER — RIVAROXABAN 15 MG PO TABS
15.0000 mg | ORAL_TABLET | Freq: Two times a day (BID) | ORAL | Status: DC
  Administered 2024-01-17 – 2024-01-21 (×9): 15 mg via ORAL
  Filled 2024-01-17 (×9): qty 1

## 2024-01-17 NOTE — Plan of Care (Signed)

## 2024-01-17 NOTE — Progress Notes (Signed)
 PHARMACY - ANTICOAGULATION CONSULT NOTE  Pharmacy Consult for heparin  --> Xarelto  Indication: acute pulmonary embolus  Allergies  Allergen Reactions   Ketorolac  Tromethamine  Swelling and Other (See Comments)    Patient reports facial edema and left arm edema after administration.   Tape Rash and Other (See Comments)    PLEASE DO NOT USE THE CLEAR, THICK, PLASTIC TAPE- only paper tape is tolerated    Wound Dressing Adhesive Rash    Patient Measurements: Height: 6' 3 (190.5 cm) Weight: 63.6 kg (140 lb 3.4 oz) IBW/kg (Calculated) : 84.5 HEPARIN  DW (KG): 63.6  Vital Signs: Temp: 97.9 F (36.6 C) (09/29 0611) Temp Source: Oral (09/29 0611) BP: 106/71 (09/29 0611) Pulse Rate: 71 (09/29 0611)  Labs: Recent Labs    01/15/24 0038 01/16/24 0431 01/16/24 1816 01/17/24 0354  HGB 8.8* 8.6*  --  7.6*  HCT 24.8* 21.0*  --  22.0*  PLT 294 239  --  214  APTT  --   --  39*  --   HEPARINUNFRC  --   --  1.03*  --   CREATININE 0.84 1.05  --  0.86    Estimated Creatinine Clearance: 109.9 mL/min (by C-G formula based on SCr of 0.86 mg/dL).   Medical History: Past Medical History:  Diagnosis Date   Anxiety    HIV (human immunodeficiency virus infection) (HCC)    Proteinuria    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HCC)    Vitamin D  deficiency 10/2018   Assessment: Patient is a 33 y.o M with hx sickle cell anemia, PE and medication noncompliance.   He was prescribed Eliquis  for hx PE, but the last time this was filled was in April 2025 per medication dispensed history tab in EPIC.  Patient presented to the ED on 01/15/24 with sickle cell pain crisis and left AMA after a few hours in the ED. He presented back to the ED on 01/16/24 with c/o pain. He was noted to be hypoxic in the ED with chest CTA on 01/15/24 positive for a small right lower lobe segmental or subsegmental pulmonary embolus.  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  Today, 01/17/2024: - hgb 7.6,  plts 214K - no bleeding  documented - scr 0.86 (crcl>100)   Plan:  - Adjust Xarelto dose to 15 mg bid x21 days, then 20mg  daily for VTE treatment indication  - Pharmacy will sign off for Xarelto.  Re-consult us  if need further assistance.  Treasa Bradshaw P 01/17/2024,7:49 AM

## 2024-01-17 NOTE — Discharge Instructions (Signed)
 Information on my medicine - XARELTO (rivaroxaban)  This medication education was reviewed with me or my healthcare representative as part of my discharge preparation.    WHY WAS XARELTO PRESCRIBED FOR YOU? Xarelto was prescribed to treat blood clots that may have been found in the veins of your legs (deep vein thrombosis) or in your lungs (pulmonary embolism) and to reduce the risk of them occurring again.  What do you need to know about Xarelto? The starting dose is one 15 mg tablet taken TWICE daily with food for the FIRST 21 DAYS then on 02/07/24  the dose is changed to one 20 mg tablet taken ONCE A DAY with your evening meal.  DO NOT stop taking Xarelto without talking to the health care provider who prescribed the medication.  Refill your prescription for 20 mg tablets before you run out.  After discharge, you should have regular check-up appointments with your healthcare provider that is prescribing your Xarelto.  In the future your dose may need to be changed if your kidney function changes by a significant amount.  What do you do if you miss a dose? If you are taking Xarelto TWICE DAILY and you miss a dose, take it as soon as you remember. You may take two 15 mg tablets (total 30 mg) at the same time then resume your regularly scheduled 15 mg twice daily the next day.  If you are taking Xarelto ONCE DAILY and you miss a dose, take it as soon as you remember on the same day then continue your regularly scheduled once daily regimen the next day. Do not take two doses of Xarelto at the same time.   Important Safety Information Xarelto is a blood thinner medicine that can cause bleeding. You should call your healthcare provider right away if you experience any of the following: Bleeding from an injury or your nose that does not stop. Unusual colored urine (red or dark brown) or unusual colored stools (red or black). Unusual bruising for unknown reasons. A serious fall or if you  hit your head (even if there is no bleeding).  Some medicines may interact with Xarelto and might increase your risk of bleeding while on Xarelto. To help avoid this, consult your healthcare provider or pharmacist prior to using any new prescription or non-prescription medications, including herbals, vitamins, non-steroidal anti-inflammatory drugs (NSAIDs) and supplements.  This website has more information on Xarelto: VisitDestination.com.br.

## 2024-01-17 NOTE — Plan of Care (Incomplete)
  Problem: Education: Goal: Long-term complications will improve Outcome: Progressing   Problem: Respiratory: Goal: Pulmonary complications will be avoided or minimized Outcome: Progressing   Problem: Sensory: Goal: Pain level will decrease with appropriate interventions Outcome: Progressing   Problem: Health Behavior: Goal: Postive changes in compliance with treatment and prescription regimens will improve Outcome: Progressing   Problem: Health Behavior/Discharge Planning: Goal: Ability to manage health-related needs will improve Outcome: Progressing   Problem: Clinical Measurements: Goal: Will remain free from infection Outcome: Progressing Goal: Diagnostic test results will improve Outcome: Progressing Goal: Respiratory complications will improve Outcome: Progressing   Problem: Coping: Goal: Level of anxiety will decrease Outcome: Progressing   Problem: Pain Managment: Goal: General experience of comfort will improve and/or be controlled Outcome: Progressing   Problem: Education: Goal: Knowledge of vaso-occlusive preventative measures will improve Outcome: Adequate for Discharge Goal: Awareness of infection prevention will improve Outcome: Adequate for Discharge Goal: Awareness of signs and symptoms of anemia will improve Outcome: Adequate for Discharge   Problem: Tissue Perfusion: Goal: Complications related to inadequate tissue perfusion will be avoided or minimized Outcome: Adequate for Discharge   Problem: Clinical Measurements: Goal: Cardiovascular complication will be avoided Outcome: Adequate for Discharge   Problem: Activity: Goal: Risk for activity intolerance will decrease Outcome: Adequate for Discharge   Problem: Nutrition: Goal: Adequate nutrition will be maintained Outcome: Adequate for Discharge   Problem: Elimination: Goal: Will not experience complications related to bowel motility Outcome: Adequate for Discharge Goal: Will not  experience complications related to urinary retention Outcome: Adequate for Discharge   Problem: Safety: Goal: Ability to remain free from injury will improve Outcome: Adequate for Discharge   Problem: Skin Integrity: Goal: Risk for impaired skin integrity will decrease Outcome: Adequate for Discharge

## 2024-01-17 NOTE — Progress Notes (Cosign Needed)
 Patient ID: Martin [name], male   DOB: 14-May-1990, 33 y.o.   MRN: [id number] Subjective: Martin [name]  is a 33 y.o. male with history of sickle cell disease, history of acute chest syndrome, multiple sickle cell crisis, multiple ED visits and hospital admission, history of PE currently on Xerelto chronic hypoxic respiratory failure on home oxygen , HIV infection and generalized anxiety disorder who presented to the ED with major complaints of generalized body pain and left side chest pain that is not consistent with his typical sickle cell pain crisis.   Patient endorses pain today at 8/10. Denies subjective fever, cough, N/V/D. No urinary symptoms.   Objective:  Vital signs in last 24 hours:  Vitals:   01/17/24 0611 01/17/24 0735 01/17/24 1003 01/17/24 1140  BP: 106/71  95/63   Pulse: 71  79   Resp: 16 18 20 14   Temp: 97.9 F (36.6 C)  98.1 F (36.7 C)   TempSrc: Oral  Oral   SpO2: 93% 91% 92% 91%  Weight:      Height:        Intake/Output from previous day:   Intake/Output Summary (Last 24 hours) at 01/17/2024 1242 Last data filed at 01/17/2024 0227 Gross per 24 hour  Intake 345.09 ml  Output --  Net 345.09 ml    Physical Exam: General: Alert, awake, oriented x3, in no acute distress.  HEENT: [place]/AT PEERL, EOMI Neck: Trachea midline,  no masses, no thyromegal,y no JVD, no carotid bruit OROPHARYNX:  Moist, No exudate/ erythema/lesions.  Heart: Regular rate and rhythm, without murmurs, rubs, gallops, PMI non-displaced, no heaves or thrills on palpation.  Lungs: Clear to auscultation, no wheezing or rhonchi noted. No increased vocal fremitus resonant to percussion  Abdomen: Soft, nontender, nondistended, positive bowel sounds, no masses no hepatosplenomegaly noted..  Neuro: No focal neurological deficits noted cranial nerves II through XII grossly intact. DTRs 2+ bilaterally upper and lower extremities. Strength 5 out of 5 in bilateral upper and lower  extremities. Musculoskeletal: Generalize body tenderness Psychiatric: Patient alert and oriented x3, good insight and cognition, good recent to remote recall. Lymph node survey: No cervical axillary or inguinal lymphadenopathy noted.  Lab Results:  Basic Metabolic Panel:    Component Value Date/Time   NA 138 01/17/2024 0354   NA 140 01/12/2023 1607   K 4.2 01/17/2024 0354   CL 108 01/17/2024 0354   CO2 19 (L) 01/17/2024 0354   BUN 8 01/17/2024 0354   BUN 8 01/12/2023 1607   CREATININE 0.86 01/17/2024 0354   GLUCOSE 87 01/17/2024 0354   CALCIUM  8.1 (L) 01/17/2024 0354   CBC:    Component Value Date/Time   WBC 12.2 (H) 01/17/2024 0354   HGB 7.6 (L) 01/17/2024 0354   HGB 7.4 (L) 01/12/2023 1607   HCT 22.0 (L) 01/17/2024 0354   HCT 21.7 (L) 01/12/2023 1607   PLT 214 01/17/2024 0354   PLT 321 01/12/2023 1607   MCV 101.4 (H) 01/17/2024 0354   MCV 94 01/12/2023 1607   NEUTROABS 3.9 01/16/2024 0431   NEUTROABS 3.8 01/12/2023 1607   LYMPHSABS 4.6 (H) 01/16/2024 0431   LYMPHSABS 5.6 (H) 01/12/2023 1607   MONOABS 1.0 01/16/2024 0431   EOSABS 0.1 01/16/2024 0431   EOSABS 0.2 01/12/2023 1607   BASOSABS 0.1 01/16/2024 0431   BASOSABS 0.1 01/12/2023 1607    No results found for this or any previous visit (from the past 240 hours).  Studies/Results: DG Chest Portable 1 View Result Date: 01/16/2024 CLINICAL DATA:  33 year old male with sickle cell disease, hypoxia, small right lower lobe pulmonary embolus on CTA yesterday. Query porta calf placement. EXAM: PORTABLE CHEST 1 VIEW COMPARISON:  CTA chest yesterday. Chest radiographs 0048 hours yesterday. And earlier. FINDINGS: Portable AP semi upright view at 0446 hours. Stable right chest power port, with looped catheter tubing at the thoracic inlet which appears to be within the lumen of the vein at the jugular and subclavian venous confluence when judging from the CTA chest yesterday. This Port-A-Cath has been in place with stable  configuration since it was seemingly revised in May of 2023 (see radiographs 09/01/2021, 08/12/2021). Cardiomegaly, mediastinal contours appear stable from yesterday. Larger lung volumes now. No pneumothorax or pleural effusion. Ventilation does appear improved at the right lung base from radiographs yesterday. Otherwise Coarse bilateral pulmonary interstitial opacity is stable. IMPRESSION: 1. Chronic right chest Port-A-Cath with stable configuration since May of 2023 (looped catheter tubing at the thoracic inlet appears to be within the lumen of the jugular and subclavian venous confluence on CT/CTA). 2. Larger lung volumes with improved ventilation at the right lung base from yesterday. Otherwise stable coarse bilateral pulmonary interstitial opacity, Cardiomegaly. Electronically Signed   By: VEAR Hurst M.D.   On: 01/16/2024 05:10     Medications: Scheduled Meds:  abacavir -dolutegravir -lamiVUDine   1 tablet Oral QHS   Chlorhexidine  Gluconate Cloth  6 each Topical Daily   folic acid   500 mcg Oral Daily   HYDROmorphone    Intravenous Q4H   hydroxyurea   2,000 mg Oral QHS   Rivaroxaban  15 mg Oral BID WC   Followed by   NOREEN ON 02/07/2024] rivaroxaban  20 mg Oral Q supper   Continuous Infusions:  sodium chloride  100 mL/hr at 01/17/24 0230   PRN Meds:.diphenhydrAMINE , naloxone  **AND** sodium chloride  flush, ondansetron , oxyCODONE -acetaminophen  **AND** oxyCODONE , sodium chloride  flush  Consultants: None  Procedures: None  Antibiotics: None  Assessment/Plan: Principal Problem:   Acute on chronic hypoxic respiratory failure (HCC) Active Problems:   Sickle cell disease with crisis (HCC)   Generalized anxiety disorder   Chronic pain syndrome   History of pulmonary embolism   Leukocytosis   Hb Sickle Cell Disease with Pain crisis: Continue IVF 0.45% Saline @145  mL/h, continue weight based Dilaudid  PCA, continue oral home pain medications as ordered. Monitor vitals very closely,  Re-evaluate pain scale regularly, 2 L of Oxygen  by [place]. Patient encouraged to ambulate on the hallway today.  History of pulmonary embolism: Positive for a small right lower lobe segmental or    subsegmental pulmonary embolus. No other pulmonary artery filling defect identified. Eliquis  changed to Xarelto.  Leukocytosis: slightly elevated most likely due to active crisis, no s/s of acute infection, will monitor without antibiotics. Monitor daily cbc   Anemia of Chronic Disease: Hgb at baseline,  will continue to monitor daily CBC Chronic pain Syndrome: Continue oral home pain medications as ordered.  Asymptomatic HIV infection: Clinically stable.  Will continue home medication. Chronic hypoxic respiratory failure: Patient is saturating well, on 2 L of oxygen .  Continue incentive spirometry.  Ambulate on the hallway. Generalized anxiety disorder: Patient is clinically stable.  He denies any suicidal ideations or thoughts.  Continue home medications.   Code Status: Full Code Family Communication: N/A Disposition Plan: Not yet ready for discharge  [name] Cover NP   If 7PM-7AM, please contact night-coverage.  01/17/2024, 12:42 PM  LOS: 1 day

## 2024-01-18 LAB — CBC
HCT: 21.4 % — ABNORMAL LOW (ref 39.0–52.0)
Hemoglobin: 7.8 g/dL — ABNORMAL LOW (ref 13.0–17.0)
MCH: 36.3 pg — ABNORMAL HIGH (ref 26.0–34.0)
MCHC: 36.4 g/dL — ABNORMAL HIGH (ref 30.0–36.0)
MCV: 99.5 fL (ref 80.0–100.0)
Platelets: 194 K/uL (ref 150–400)
RBC: 2.15 MIL/uL — ABNORMAL LOW (ref 4.22–5.81)
RDW: 25.5 % — ABNORMAL HIGH (ref 11.5–15.5)
WBC: 15.5 K/uL — ABNORMAL HIGH (ref 4.0–10.5)
nRBC: 15.1 % — ABNORMAL HIGH (ref 0.0–0.2)

## 2024-01-18 MED ORDER — LACTULOSE 10 GM/15ML PO SOLN
30.0000 g | Freq: Every day | ORAL | Status: DC | PRN
  Administered 2024-01-18: 30 g via ORAL
  Filled 2024-01-18: qty 60

## 2024-01-18 NOTE — Progress Notes (Cosign Needed)
 Patient ID: Martin [name], male   DOB: December 11, 1990, 33 y.o.   MRN: [id number] Subjective: Martin [name]  is a 33 y.o. male with history of sickle cell disease, history of acute chest syndrome, multiple sickle cell crisis, multiple ED visits and hospital admission, history of PE currently on Xerelto, chronic hypoxic respiratory failure on home oxygen , HIV infection and generalized anxiety disorder who presented to the ED with major complaints of generalized body pain and left side chest pain that is not consistent with his typical sickle cell pain crisis.   Patient endorses pain today at 7/10. Denies subjective fever, cough, N/V/D. No urinary symptoms.    Objective:  Vital signs in last 24 hours:  Vitals:   01/19/24 0439 01/19/24 0613 01/19/24 0845 01/19/24 1148  BP:  107/73    Pulse:  (!) 106    Resp: 18 18 16 15   Temp:  98.4 F (36.9 C)    TempSrc:  Oral    SpO2: 93% 92% 96% 90%  Weight:      Height:        Intake/Output from previous day:   Intake/Output Summary (Last 24 hours) at 01/19/2024 1322 Last data filed at 01/19/2024 1040 Gross per 24 hour  Intake 600 ml  Output 1700 ml  Net -1100 ml    Physical Exam: General: Alert, awake, oriented x3, in no acute distress.  HEENT: [place]/AT PEERL, EOMI Neck: Trachea midline,  no masses, no thyromegal,y no JVD, no carotid bruit OROPHARYNX:  Moist, No exudate/ erythema/lesions.  Heart: Regular rate and rhythm, without murmurs, rubs, gallops, PMI non-displaced, no heaves or thrills on palpation.  Lungs: Clear to auscultation, no wheezing or rhonchi noted. No increased vocal fremitus resonant to percussion  Abdomen: Soft, nontender, nondistended, positive bowel sounds, no masses no hepatosplenomegaly noted..  Neuro: No focal neurological deficits noted cranial nerves II through XII grossly intact. DTRs 2+ bilaterally upper and lower extremities. Strength 5 out of 5 in bilateral upper and lower extremities. Musculoskeletal:  Generalize body tenderness Psychiatric: Patient alert and oriented x3, good insight and cognition, good recent to remote recall. Lymph node survey: No cervical axillary or inguinal lymphadenopathy noted.  Lab Results:  Basic Metabolic Panel:    Component Value Date/Time   NA 138 01/17/2024 0354   NA 140 01/12/2023 1607   K 4.2 01/17/2024 0354   CL 108 01/17/2024 0354   CO2 19 (L) 01/17/2024 0354   BUN 8 01/17/2024 0354   BUN 8 01/12/2023 1607   CREATININE 0.86 01/17/2024 0354   GLUCOSE 87 01/17/2024 0354   CALCIUM  8.1 (L) 01/17/2024 0354   CBC:    Component Value Date/Time   WBC 13.4 (H) 01/19/2024 0112   HGB 7.7 (L) 01/19/2024 0112   HGB 7.4 (L) 01/12/2023 1607   HCT 21.2 (L) 01/19/2024 0112   HCT 21.7 (L) 01/12/2023 1607   PLT 238 01/19/2024 0112   PLT 321 01/12/2023 1607   MCV 98.6 01/19/2024 0112   MCV 94 01/12/2023 1607   NEUTROABS 3.9 01/16/2024 0431   NEUTROABS 3.8 01/12/2023 1607   LYMPHSABS 4.6 (H) 01/16/2024 0431   LYMPHSABS 5.6 (H) 01/12/2023 1607   MONOABS 1.0 01/16/2024 0431   EOSABS 0.1 01/16/2024 0431   EOSABS 0.2 01/12/2023 1607   BASOSABS 0.1 01/16/2024 0431   BASOSABS 0.1 01/12/2023 1607    No results found for this or any previous visit (from the past 240 hours).  Studies/Results: No results found.    Medications: Scheduled Meds:  abacavir -dolutegravir -lamiVUDine   1 tablet Oral QHS   Chlorhexidine  Gluconate Cloth  6 each Topical Daily   folic acid   500 mcg Oral Daily   HYDROmorphone    Intravenous Q4H   hydroxyurea   2,000 mg Oral QHS   Rivaroxaban  15 mg Oral BID WC   Followed by   NOREEN ON 02/07/2024] rivaroxaban  20 mg Oral Q supper   Continuous Infusions:   PRN Meds:.diphenhydrAMINE , lactulose , lip balm, naloxone  **AND** sodium chloride  flush, ondansetron , oxyCODONE -acetaminophen  **AND** oxyCODONE , sodium chloride  flush  Consultants: None  Procedures: None  Antibiotics: None  Assessment/Plan: Principal Problem:   Acute  on chronic hypoxic respiratory failure (HCC) Active Problems:   Sickle cell disease with crisis (HCC)   Generalized anxiety disorder   Chronic pain syndrome   History of pulmonary embolism   Leukocytosis   Hb Sickle Cell Disease with Pain crisis: Continue IVF 0.45% Saline @ KVO, continue weight based Dilaudid  PCA, continue oral home pain medications as ordered. Monitor vitals very closely, Re-evaluate pain scale regularly, 2 L of Oxygen  by [place]. Patient encouraged to ambulate on the hallway today.  History of pulmonary embolism: Positive for small right lower lobe segmental or    subsegmental pulmonary embolus. No other pulmonary artery filling defect identified. Eliquis  changed to Xarelto.  Leukocytosis: slightly elevated most likely due to active crisis, no s/s of acute infection, will monitor without antibiotics. Monitor daily cbc   Anemia of Chronic Disease: Hgb at baseline,  will continue to monitor daily CBC Chronic pain Syndrome: Continue oral home pain medications as ordered.  Asymptomatic HIV infection: Clinically stable.  Will continue home medication. Chronic hypoxic respiratory failure: Patient is saturating well, on 2 L of oxygen .  Continue incentive spirometry.  Ambulate on the hallway. Generalized anxiety disorder: Patient is clinically stable.  He denies any suicidal ideations or thoughts.  Continue home medications.   Code Status: Full Code Family Communication: N/A Disposition Plan: Not yet ready for discharge  [name] Cover NP   If 7PM-7AM, please contact night-coverage.  01/19/2024, 1:22 PM  LOS: 3 days

## 2024-01-18 NOTE — TOC Initial Note (Addendum)
 Transition of Care [facility]) - Initial/Assessment Note    Patient Details  Name: Martin [name] MRN: [id number] Date of Birth: [birth year]  [facility]) CM/SW Contact:    Tawni CHRISTELLA Eva, LCSW Phone Number: 01/18/2024, 3:26 PM  Clinical Narrative:                  CSW received a consult for medication assistance. Per pt's chart, he is uninsured. Pt reported that he has [place]  Medicaid. He stated that his parents reside in [place] , and this is where he keeps all of his belongings.  CSW inquired if the pt will be staying in [place], and if so, whether he plans to switch his Medicaid to [place]  Medicaid. Pt reported that he does not want to switch from [place]  Medicaid to [place]  Medicaid until he has stable housing. He stated that he has found a place to live but needs assistance paying the first month's rent and deposits. He receives disability income, but it is not enough to cover all expenses; he only has enough to pay the rent. CSW provided resources and suggested the pt reach out to the [employer].  Pt reported that he does not need medication assistance and stated, "They need to stop putting that in my chart." He mentioned that he gets his medications at the [facility] on [address] in [place], [place].  CSW also informed the pt about the [facility] and their case management services and suggested the pt to contact them for their services.   Pt is on oxygen  at baseline and reported that he has a portable tank for discharge. He also reported that he would like a bus pass upon discharge. Care management to follow.   Expected Discharge Plan: Homeless Shelter Barriers to Discharge: Continued Medical Work up   Patient Goals and CMS Choice Patient states their goals for this hospitalization and ongoing recovery are:: home health          Expected Discharge Plan and Services       Living arrangements for the past 2 months: Homeless                                       Prior Living Arrangements/Services Living arrangements for the past 2 months: Homeless Lives with:: Self Patient language and need for interpreter reviewed:: Yes Do you feel safe going back to the place where you live?: Yes      Need for Family Participation in Patient Care: No (Comment) Care giver support system in place?: No (comment)   Criminal Activity/Legal Involvement Pertinent to Current Situation/Hospitalization: No - Comment as needed  Activities of Daily Living   ADL Screening (condition at time of admission) Independently performs ADLs?: Yes (appropriate for developmental age) Is the patient deaf or have difficulty hearing?: No Does the patient have difficulty seeing, even when wearing glasses/contacts?: No Does the patient have difficulty concentrating, remembering, or making decisions?: No  Permission Sought/Granted                  Emotional Assessment Appearance:: Appears stated age Attitude/Demeanor/Rapport: Gracious Affect (typically observed): Accepting Orientation: : Oriented to Place, Oriented to  Time, Oriented to Situation, Oriented to Self   Psych Involvement: No (comment)  Admission diagnosis:  Acute on chronic hypoxic respiratory failure (HCC) [J96.21] Patient Active Problem List   Diagnosis Date Noted   Acute on chronic hypoxic respiratory  failure (HCC) 01/16/2024   Chronic anticoagulation 12/18/2023   Sickle cell anemia (HCC) 10/28/2023   Sickle cell crisis (HCC) 10/12/2023   Chronic hypoxic respiratory failure (HCC) 08/04/2023   Iron overload, 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   Influenza A with pneumonia 06/20/2023   Acute hypoxemic respiratory failure (HCC) 06/20/2023   AKI (acute kidney injury) 06/20/2023   Influenza A with respiratory manifestations 06/20/2023   Acute on chronic anemia 03/16/2023   History of HIV infection (HCC) 03/16/2023   Hypokalemia 03/16/2023   Anemia of chronic disease  07/13/2022   Hyperbilirubinemia 07/13/2022   Tinea capitis 07/29/2021   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HCC) 06/24/2021   Bacteremia due to Enterococcus 08/29/2020   Acute chest syndrome (HCC) 08/26/2020   Hypoxia 07/09/2020   COVID-19 05/13/2020   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/18/2020   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   Abnormal penile discharge, without blood 02/22/2020   Leukocytosis 01/02/2020   Anxiety 11/27/2019   Proteinuria 11/27/2019   Acute on chronic respiratory failure with hypoxia (HCC) 11/16/2019   Chronic, continuous use of opioids 08/15/2019   Seasonal allergies 08/15/2019   Chest congestion    Sickle cell anemia with crisis (HCC) 08/04/2019   Chronic pain syndrome 08/04/2019   History of pulmonary embolism 08/04/2019   Acute pulmonary embolism (HCC) 02/10/2019   Acute chest syndrome due to sickle cell crisis (HCC) 02/10/2019   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) 03/09/2018   Bone pain 03/07/2018   Hip pain 03/07/2018   Acute bronchitis due to Streptococcus 02/21/2018   Heart murmur 02/03/2017   Sickle cell disease with crisis (HCC) 02/02/2017   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 [address] [place] [address] Phone: [phone] Fax: [phone]  [id number] [place], [place] - [address] AT [facility] OF [address] [place] [address] Phone: [phone] Fax: [phone]  [id number] - [place], [place] - [address] AT [facility] [address] [place] [place] [address] Phone: [phone] Fax: [phone]     Social Drivers of Health (SDOH) Social History: SDOH Screenings   Food Insecurity: No Food Insecurity (01/11/2024)  Housing: High Risk (01/11/2024)  Transportation Needs: No Transportation Needs (01/11/2024)  Utilities: Not At Risk (01/11/2024)  Depression (PHQ2-9): Low Risk  (01/11/2024)  Financial Resource Strain: Low Risk  (10/09/2023)   Received from [facility]  Recent Concern: Financial Resource Strain - Medium Risk (08/03/2023)  Physical Activity: Sufficiently Active (10/09/2023)   Received from [facility]  Social Connections: Socially Isolated (01/04/2024)  Stress: No Stress Concern Present (10/09/2023)   Received from [facility]  Tobacco Use: Low Risk  (01/17/2024)   SDOH Interventions:     Readmission Risk Interventions    01/06/2024   12:15 PM 12/22/2023    4:14 PM 11/10/2023   10:19 AM  Readmission Risk Prevention Plan  Transportation Screening Complete Complete Complete  Medication Review (RN Care Manager) Complete Complete Referral to Pharmacy  PCP or Specialist appointment within 3-5 days  of discharge Complete Complete Complete  HRI or Home Care Consult Complete Complete Complete  SW Recovery Care/Counseling Consult Complete Complete Complete  Palliative Care Screening Not Applicable Not Applicable Not Applicable  Skilled Nursing Facility Not Applicable Not Applicable Not Applicable

## 2024-01-19 LAB — CBC
HCT: 21.2 % — ABNORMAL LOW (ref 39.0–52.0)
Hemoglobin: 7.7 g/dL — ABNORMAL LOW (ref 13.0–17.0)
MCH: 35.8 pg — ABNORMAL HIGH (ref 26.0–34.0)
MCHC: 36.3 g/dL — ABNORMAL HIGH (ref 30.0–36.0)
MCV: 98.6 fL (ref 80.0–100.0)
Platelets: 238 K/uL (ref 150–400)
RBC: 2.15 MIL/uL — ABNORMAL LOW (ref 4.22–5.81)
RDW: 25.7 % — ABNORMAL HIGH (ref 11.5–15.5)
WBC: 13.4 K/uL — ABNORMAL HIGH (ref 4.0–10.5)
nRBC: 14.1 % — ABNORMAL HIGH (ref 0.0–0.2)

## 2024-01-19 MED ORDER — ALUM & MAG HYDROXIDE-SIMETH 200-200-20 MG/5ML PO SUSP
30.0000 mL | Freq: Four times a day (QID) | ORAL | Status: DC | PRN
  Administered 2024-01-19 – 2024-01-21 (×2): 30 mL via ORAL
  Filled 2024-01-19 (×2): qty 30

## 2024-01-19 MED ORDER — BISACODYL 10 MG RE SUPP
10.0000 mg | Freq: Once | RECTAL | Status: AC
  Administered 2024-01-19: 10 mg via RECTAL
  Filled 2024-01-19: qty 1

## 2024-01-19 MED ORDER — CARMEX CLASSIC LIP BALM EX OINT
TOPICAL_OINTMENT | CUTANEOUS | Status: DC | PRN
  Filled 2024-01-19: qty 10

## 2024-01-19 NOTE — Progress Notes (Signed)
 Dilaudid  PCA syringe replaced, previous syringe had 2mg  remaining and wasted with Margery Cary, RN.

## 2024-01-19 NOTE — Progress Notes (Signed)
 Patient ID: Martin [name], male   DOB: [birth year], 33 y.o.   MRN: [id number] Subjective: Martin [name]  is a 33 y.o. male with history of sickle cell disease, history of acute chest syndrome, multiple sickle cell crisis, multiple ED visits and hospital admission, history of PE currently on Xerelto, chronic hypoxic respiratory failure on home oxygen , HIV infection and generalized anxiety disorder who presented to the ED with major complaints of generalized body pain and left side chest pain that is not consistent with his typical sickle cell pain crisis.   Patient endorses pain today at 7/10 gradually improving from yesterday. Denies subjective fever, cough, N/V/D. No urinary symptoms.    Objective:  Vital signs in last 24 hours:  Vitals:   01/19/24 0027 01/19/24 0439 01/19/24 0613 01/19/24 0845  BP:   107/73   Pulse:   (!) 106   Resp: 20 18 18 16   Temp:   98.4 F (36.9 C)   TempSrc:   Oral   SpO2: 93% 93% 92% 96%  Weight:      Height:        Intake/Output from previous day:   Intake/Output Summary (Last 24 hours) at 01/19/2024 0945 Last data filed at 01/19/2024 0600 Gross per 24 hour  Intake 600 ml  Output 1100 ml  Net -500 ml    Physical Exam: General: Alert, awake, oriented x3, in no acute distress.  HEENT: [place]/AT PEERL, EOMI Neck: Trachea midline,  no masses, no thyromegal,y no JVD, no carotid bruit OROPHARYNX:  Moist, No exudate/ erythema/lesions.  Heart: Regular rate and rhythm, without murmurs, rubs, gallops, PMI non-displaced, no heaves or thrills on palpation.  Lungs: Clear to auscultation, no wheezing or rhonchi noted. No increased vocal fremitus resonant to percussion  Abdomen: Soft, nontender, nondistended, positive bowel sounds, no masses no hepatosplenomegaly noted..  Neuro: No focal neurological deficits noted cranial nerves II through XII grossly intact. DTRs 2+ bilaterally upper and lower extremities. Strength 5 out of 5 in bilateral upper and lower  extremities. Musculoskeletal: Generalize body tenderness Psychiatric: Patient alert and oriented x3, good insight and cognition, good recent to remote recall. Lymph node survey: No cervical axillary or inguinal lymphadenopathy noted.  Lab Results:  Basic Metabolic Panel:    Component Value Date/Time   NA 138 01/17/2024 0354   NA 140 01/12/2023 1607   K 4.2 01/17/2024 0354   CL 108 01/17/2024 0354   CO2 19 (L) 01/17/2024 0354   BUN 8 01/17/2024 0354   BUN 8 01/12/2023 1607   CREATININE 0.86 01/17/2024 0354   GLUCOSE 87 01/17/2024 0354   CALCIUM  8.1 (L) 01/17/2024 0354   CBC:    Component Value Date/Time   WBC 13.4 (H) 01/19/2024 0112   HGB 7.7 (L) 01/19/2024 0112   HGB 7.4 (L) 01/12/2023 1607   HCT 21.2 (L) 01/19/2024 0112   HCT 21.7 (L) 01/12/2023 1607   PLT 238 01/19/2024 0112   PLT 321 01/12/2023 1607   MCV 98.6 01/19/2024 0112   MCV 94 01/12/2023 1607   NEUTROABS 3.9 01/16/2024 0431   NEUTROABS 3.8 01/12/2023 1607   LYMPHSABS 4.6 (H) 01/16/2024 0431   LYMPHSABS 5.6 (H) 01/12/2023 1607   MONOABS 1.0 01/16/2024 0431   EOSABS 0.1 01/16/2024 0431   EOSABS 0.2 01/12/2023 1607   BASOSABS 0.1 01/16/2024 0431   BASOSABS 0.1 01/12/2023 1607    No results found for this or any previous visit (from the past 240 hours).  Studies/Results: No results found.    Medications:  Scheduled Meds:  abacavir -dolutegravir -lamiVUDine   1 tablet Oral QHS   Chlorhexidine  Gluconate Cloth  6 each Topical Daily   folic acid   500 mcg Oral Daily   HYDROmorphone    Intravenous Q4H   hydroxyurea   2,000 mg Oral QHS   Rivaroxaban  15 mg Oral BID WC   Followed by   NOREEN ON 02/07/2024] rivaroxaban  20 mg Oral Q supper   Continuous Infusions:   PRN Meds:.diphenhydrAMINE , lactulose , naloxone  **AND** sodium chloride  flush, ondansetron , oxyCODONE -acetaminophen  **AND** oxyCODONE , sodium chloride   flush  Consultants: None  Procedures: None  Antibiotics: None  Assessment/Plan: Principal Problem:   Acute on chronic hypoxic respiratory failure (HCC) Active Problems:   Sickle cell disease with crisis (HCC)   Generalized anxiety disorder   Chronic pain syndrome   History of pulmonary embolism   Leukocytosis   Hb Sickle Cell Disease with Pain crisis: Continue IVF 0.45% Saline @KVO , continue weight based Dilaudid  PCA, continue oral home pain medications as ordered. Monitor vitals very closely, Re-evaluate pain scale regularly, 2 L of Oxygen  by [place]. Patient encouraged to ambulate on the hallway today.  History of pulmonary embolism: Positive for small right lower lobe segmental or    subsegmental pulmonary embolus. No other pulmonary artery filling defect identified. Eliquis  changed to Xarelto.  Leukocytosis: slightly elevated most likely due to active crisis, no s/s of acute infection, will monitor without antibiotics. Monitor daily cbc   Anemia of Chronic Disease: Hgb at baseline,  will continue to monitor daily CBC Chronic pain Syndrome: Continue oral home pain medications as ordered.  Asymptomatic HIV infection: Clinically stable.  Will continue home medication. Chronic hypoxic respiratory failure: Patient is saturating well, on 2 L of oxygen .  Continue incentive spirometry.  Ambulate on the hallway. Generalized anxiety disorder: Patient is clinically stable.  He denies any suicidal ideations or thoughts.  Continue home medications.   Code Status: Full Code Family Communication: N/A Disposition Plan: Not yet ready for discharge  [name] Cover NP   If 7PM-7AM, please contact night-coverage.  01/19/2024, 9:45 AM  LOS: 3 days

## 2024-01-19 NOTE — Plan of Care (Incomplete)
  Problem: Education: Goal: Knowledge of vaso-occlusive preventative measures will improve Outcome: Progressing Goal: Awareness of infection prevention will improve Outcome: Progressing Goal: Awareness of signs and symptoms of anemia will improve Outcome: Progressing   Problem: Self-Care: Goal: Ability to incorporate actions that prevent/reduce pain crisis will improve Outcome: Progressing   Problem: Bowel/Gastric: Goal: Gut motility will be maintained Outcome: Progressing   Problem: Respiratory: Goal: Pulmonary complications will be avoided or minimized Outcome: Progressing Goal: Acute Chest Syndrome will be identified early to prevent complications Outcome: Progressing   Problem: Fluid Volume: Goal: Ability to maintain a balanced intake and output will improve Outcome: Progressing   Problem: Sensory: Goal: Pain level will decrease with appropriate interventions Outcome: Progressing

## 2024-01-20 LAB — CBC
HCT: 20.1 % — ABNORMAL LOW (ref 39.0–52.0)
Hemoglobin: 7.3 g/dL — ABNORMAL LOW (ref 13.0–17.0)
MCH: 35.8 pg — ABNORMAL HIGH (ref 26.0–34.0)
MCHC: 36.3 g/dL — ABNORMAL HIGH (ref 30.0–36.0)
MCV: 98.5 fL (ref 80.0–100.0)
Platelets: 269 K/uL (ref 150–400)
RBC: 2.04 MIL/uL — ABNORMAL LOW (ref 4.22–5.81)
RDW: 25.5 % — ABNORMAL HIGH (ref 11.5–15.5)
WBC: 14.6 K/uL — ABNORMAL HIGH (ref 4.0–10.5)
nRBC: 40.1 % — ABNORMAL HIGH (ref 0.0–0.2)

## 2024-01-20 MED ORDER — CYCLOBENZAPRINE HCL 5 MG PO TABS
5.0000 mg | ORAL_TABLET | Freq: Three times a day (TID) | ORAL | Status: DC | PRN
  Administered 2024-01-20: 5 mg via ORAL
  Filled 2024-01-20 (×2): qty 1

## 2024-01-20 NOTE — Progress Notes (Signed)
 Patient ID: Martin [name], male   DOB: May 14, 1990, 33 y.o.   MRN: [id number] Subjective: Martin [name]  is a 33 y.o. male with history of sickle cell disease, history of acute chest syndrome, multiple sickle cell crisis, multiple ED visits and hospital admission, history of PE currently on Xerelto, chronic hypoxic respiratory failure on home oxygen , HIV infection and generalized anxiety disorder who presented to the ED with major complaints of generalized body pain and left side chest pain that is not consistent with his typical sickle cell pain crisis.   Patient endorses pain today at 7-6/10, his pain is gradually improving from yesterday. Denies subjective fever, cough, N/V/D. No urinary symptoms.    Objective:  Vital signs in last 24 hours:  Vitals:   01/20/24 0406 01/20/24 0753 01/20/24 0758 01/20/24 1200  BP: 107/68     Pulse: 97     Resp:  18 18 16   Temp: 98.6 F (37 C)     TempSrc: Oral     SpO2: 93% 93% 94% 94%  Weight:      Height:        Intake/Output from previous day:   Intake/Output Summary (Last 24 hours) at 01/20/2024 1244 Last data filed at 01/20/2024 0500 Gross per 24 hour  Intake 720 ml  Output 900 ml  Net -180 ml    Physical Exam: General: Alert, awake, oriented x3, in no acute distress.  HEENT: [place]/AT PEERL, EOMI Neck: Trachea midline,  no masses, no thyromegal,y no JVD, no carotid bruit OROPHARYNX:  Moist, No exudate/ erythema/lesions.  Heart: Regular rate and rhythm, without murmurs, rubs, gallops, PMI non-displaced, no heaves or thrills on palpation.  Lungs: Clear to auscultation, no wheezing or rhonchi noted. No increased vocal fremitus resonant to percussion  Abdomen: Soft, nontender, nondistended, positive bowel sounds, no masses no hepatosplenomegaly noted..  Neuro: No focal neurological deficits noted cranial nerves II through XII grossly intact. DTRs 2+ bilaterally upper and lower extremities. Strength 5 out of 5 in bilateral upper and  lower extremities. Musculoskeletal: Generalize body tenderness Psychiatric: Patient alert and oriented x3, good insight and cognition, good recent to remote recall. Lymph node survey: No cervical axillary or inguinal lymphadenopathy noted.  Lab Results:  Basic Metabolic Panel:    Component Value Date/Time   NA 138 01/17/2024 0354   NA 140 01/12/2023 1607   K 4.2 01/17/2024 0354   CL 108 01/17/2024 0354   CO2 19 (L) 01/17/2024 0354   BUN 8 01/17/2024 0354   BUN 8 01/12/2023 1607   CREATININE 0.86 01/17/2024 0354   GLUCOSE 87 01/17/2024 0354   CALCIUM  8.1 (L) 01/17/2024 0354   CBC:    Component Value Date/Time   WBC 14.6 (H) 01/20/2024 0405   HGB 7.3 (L) 01/20/2024 0405   HGB 7.4 (L) 01/12/2023 1607   HCT 20.1 (L) 01/20/2024 0405   HCT 21.7 (L) 01/12/2023 1607   PLT 269 01/20/2024 0405   PLT 321 01/12/2023 1607   MCV 98.5 01/20/2024 0405   MCV 94 01/12/2023 1607   NEUTROABS 3.9 01/16/2024 0431   NEUTROABS 3.8 01/12/2023 1607   LYMPHSABS 4.6 (H) 01/16/2024 0431   LYMPHSABS 5.6 (H) 01/12/2023 1607   MONOABS 1.0 01/16/2024 0431   EOSABS 0.1 01/16/2024 0431   EOSABS 0.2 01/12/2023 1607   BASOSABS 0.1 01/16/2024 0431   BASOSABS 0.1 01/12/2023 1607    No results found for this or any previous visit (from the past 240 hours).  Studies/Results: No results found.  Medications: Scheduled Meds:  abacavir -dolutegravir -lamiVUDine   1 tablet Oral QHS   Chlorhexidine  Gluconate Cloth  6 each Topical Daily   folic acid   500 mcg Oral Daily   HYDROmorphone    Intravenous Q4H   hydroxyurea   2,000 mg Oral QHS   Rivaroxaban  15 mg Oral BID WC   Followed by   NOREEN ON 02/07/2024] rivaroxaban  20 mg Oral Q supper   Continuous Infusions:   PRN Meds:.alum & mag hydroxide-simeth, diphenhydrAMINE , lactulose , lip balm, naloxone  **AND** sodium chloride  flush, ondansetron , oxyCODONE -acetaminophen  **AND** oxyCODONE , sodium chloride   flush  Consultants: None  Procedures: None  Antibiotics: None  Assessment/Plan: Principal Problem:   Acute on chronic hypoxic respiratory failure (HCC) Active Problems:   Sickle cell disease with crisis (HCC)   Generalized anxiety disorder   Chronic pain syndrome   History of pulmonary embolism   Leukocytosis   Hb Sickle Cell Disease with Pain crisis: Pain is gradually improving continue IVF 0.45% Saline @KVO , continue current dose of Dilaudid  PCA, continue oral home pain medications as ordered. Monitor vitals very closely, Re-evaluate pain scale regularly, 2 L of Oxygen  by [place]. Patient encouraged to ambulate on the hallway today.  History of pulmonary embolism: Positive for small right lower lobe segmental or    subsegmental pulmonary embolus. No other pulmonary artery filling defect identified. Eliquis  changed to Xarelto.  Leukocytosis: slightly elevated but slightly trending down.  Elevated WBC likely due to active crisis, no s/s of acute infection, will monitor without antibiotics. Monitor daily cbc   Anemia of Chronic Disease: Hgb at baseline,  will continue to monitor daily CBC Chronic pain Syndrome: Continue oral home pain medications as ordered.  Asymptomatic HIV infection: Clinically stable.  Will continue home medication. Chronic hypoxic respiratory failure: Patient is saturating well, on 2 L of oxygen .  Continue incentive spirometry.  Ambulate on the hallway. Generalized anxiety disorder: Patient is clinically stable.  He denies any suicidal ideations or thoughts.  Continue home medications.   Code Status: Full Code Family Communication: N/A Disposition Plan: Patient will be ready for discharge in the morning.  Martin [name] Cover NP   If 7PM-7AM, please contact night-coverage.  01/20/2024, 12:44 PM  LOS: 4 days

## 2024-01-20 NOTE — Plan of Care (Signed)

## 2024-01-21 ENCOUNTER — Other Ambulatory Visit: Payer: Self-pay | Admitting: Nurse Practitioner

## 2024-01-21 ENCOUNTER — Other Ambulatory Visit (HOSPITAL_COMMUNITY): Payer: Self-pay

## 2024-01-21 LAB — CBC
HCT: 19 % — ABNORMAL LOW (ref 39.0–52.0)
Hemoglobin: 7 g/dL — ABNORMAL LOW (ref 13.0–17.0)
MCH: 36.3 pg — ABNORMAL HIGH (ref 26.0–34.0)
MCHC: 36.8 g/dL — ABNORMAL HIGH (ref 30.0–36.0)
MCV: 98.4 fL (ref 80.0–100.0)
Platelets: 280 K/uL (ref 150–400)
RBC: 1.93 MIL/uL — ABNORMAL LOW (ref 4.22–5.81)
RDW: 25.8 % — ABNORMAL HIGH (ref 11.5–15.5)
WBC: 11.5 K/uL — ABNORMAL HIGH (ref 4.0–10.5)
nRBC: 78.9 % — ABNORMAL HIGH (ref 0.0–0.2)

## 2024-01-21 MED ORDER — RIVAROXABAN (XARELTO) VTE STARTER PACK (15 & 20 MG)
ORAL_TABLET | ORAL | 0 refills | Status: DC
  Filled 2024-01-21: qty 51, 30d supply, fill #0

## 2024-01-21 MED ORDER — HEPARIN SOD (PORK) LOCK FLUSH 100 UNIT/ML IV SOLN
500.0000 [IU] | INTRAVENOUS | Status: AC | PRN
  Administered 2024-01-21: 500 [IU]
  Filled 2024-01-21: qty 5

## 2024-01-21 NOTE — TOC Transition Note (Signed)
 Transition of Care [facility]) - Discharge Note   Patient Details  Name: Martin [name] MRN: [id number] Date of Birth: 14-Feb-1991  Transition of Care [facility] CM/SW Contact:  Sonda Manuella Quill, RN Phone Number: 01/21/2024, 10:54 AM   Clinical Narrative:    Notified pt may need MATCH; spoke w/ pt in room; pt verified he has [place]  Medicaid ; therefore he does not qualify for [facility]; pt also said he will pick up medication at [facility], [place]; pt also said family will provide transportation; pt said he does not need travel oxygen  tank; no IP CM needs.   Final next level of care: Home/Self Care Barriers to Discharge: No Barriers Identified   Patient Goals and CMS Choice Patient states their goals for this hospitalization and ongoing recovery are:: home health          Discharge Placement                       Discharge Plan and Services Additional resources added to the After Visit Summary for                  DME Arranged: N/A DME Agency: NA       HH Arranged: NA HH Agency: NA        Social Drivers of Health (SDOH) Interventions SDOH Screenings   Food Insecurity: No Food Insecurity (01/11/2024)  Housing: High Risk (01/11/2024)  Transportation Needs: No Transportation Needs (01/11/2024)  Utilities: Not At Risk (01/11/2024)  Depression (PHQ2-9): Low Risk  (01/11/2024)  Financial Resource Strain: Low Risk  (10/09/2023)   [facility] from [facility]  Recent Concern: Financial Resource Strain - Medium Risk (08/03/2023)  Physical Activity: Sufficiently Active (10/09/2023)   Received from [facility]  Social Connections: Socially Isolated (01/04/2024)  Stress: No Stress Concern Present (10/09/2023)   Received from [facility]  Tobacco Use: Low Risk  (01/17/2024)     Readmission Risk Interventions    01/06/2024   12:15 PM 12/22/2023    4:14 PM 11/10/2023   10:19 AM  Readmission Risk Prevention Plan  Transportation Screening  Complete Complete Complete  Medication Review [employer]) Complete Complete Referral to Pharmacy  PCP or Specialist appointment within 3-5 days of discharge Complete Complete Complete  HRI or Home Care Consult Complete Complete Complete  SW Recovery Care/Counseling Consult Complete Complete Complete  Palliative Care Screening Not Applicable Not Applicable Not Applicable  Skilled Nursing Facility Not Applicable Not Applicable Not Applicable

## 2024-01-21 NOTE — Progress Notes (Signed)
 PCA Dilaudid  stopped at 1235, and remainder 6 mg wasted in the Pyxis/steri cycle.

## 2024-01-21 NOTE — Progress Notes (Signed)
 AVS reviewed with patient, no questions.

## 2024-01-21 NOTE — Discharge Summary (Signed)
 Physician Discharge Summary  Martin [name] FMW:[id number] DOB: 1991/02/16 DOA: 01/16/2024  PCP: [name], [name], [name]  Admit date: 01/16/2024  Discharge date: 01/21/2024  Discharge Diagnoses:  Principal Problem:   Acute on chronic hypoxic respiratory failure (HCC) Active Problems:   Sickle cell disease with crisis (HCC)   Generalized anxiety disorder   Chronic pain syndrome   History of pulmonary embolism   Leukocytosis   Discharge Condition: Stable  Disposition:  Pt is discharged home in good condition and is to follow up with [name], [name], [name] this week to have labs evaluated. Martin [name] is instructed to increase activity slowly and balance with rest for the next few days, and use prescribed medication to complete treatment of pain  Diet: Regular Wt Readings from Last 3 Encounters:  01/16/24 63.6 kg  01/16/24 63.6 kg  01/09/24 63.5 kg    History of present illness:  Martin [name] is a 33 y.o. male with medical history significant for prior pulmonary embolism, noncompliant with home Eliquis , sickle cell disease, HIV, chronic hypoxia on 4 L as needed O2 supplementation, who initially presented at [facility] ED with complaints of sickle cell pain that started the night prior to ED visit, patient reports that pain consisted of diffuse body pain.  Not improved with home opiate analgesics.  In the ED, he was noted to be hypoxic improved after 2 L [place].  He had an abnormal chest x-ray which led to CT angio chest.  This was positive for small right lower lobe segmental or subsegmental pulmonary embolism. He was advised to be admitted to the hospital however he refused and signed out AGAINST MEDICAL ADVICE.    ED Course: Temperature 98.3.  BP 95/65, pulse 84, respiratory 17, O2 saturation 91% on 5 L.  Lab studies notable for serum bicarb 21, anion gap 10, serum potassium 3.4, total bilirubin 10.2.  Hemoglobin 8.6, MCV 100.0, platelet count 239. The patient returns to  the ER, at this time he presents at [facility] with the same symptoms.  Also endorses pleuritic chest pain worse with exertion when taking a deep breath.  He denies cough or hemoptysis.  The pain is centrally located and nonradiating.  The patient received multiple doses of IV opiate-based analgesics with IV fluid without symptom improvement.  Endorses missing doses of his home Eliquis , 25% of the time.  States he is having difficulty taking the medication twice a day as all his other medications are scheduled daily.  Patient was therefore admitted for sickle cell pain crisis.    Hospital Course:  Patient was admitted for sickle cell pain crisis and managed appropriately with IVF, IV Dilaudid  via PCA and IV Toradol , as well as other adjunct therapies per sickle cell pain management protocols.  Patient is reporting improved pain back to baseline.  Eliquis  changed to Xarelto and education provided to patient on the importance of compliance. he verbalizes understanding.  He has no new concerns.  He is tolerating p.o. without nausea or vomiting, ambulating without assistance. Patient was therefore discharged home today in a hemodynamically stable condition.   Martin [name] will follow-up with PCP within 1 week of this discharge. Martin [name] was counseled extensively about nonpharmacologic means of pain management, patient verbalized understanding and was appreciative of  the care received during this admission.   We discussed the need for good hydration, monitoring of hydration status, avoidance of heat, cold, stress, and infection triggers. We discussed the need to be adherent with taking Hydrea  and  other home medications. Patient was reminded of the need to seek medical attention immediately if any symptom of bleeding, anemia, or infection occurs.  Discharge Exam: Vitals:   01/21/24 0900 01/21/24 1018  BP:  99/70  Pulse:  70  Resp: 16 17  Temp:  98.2 F (36.8 C)  SpO2: 98% 97%   Vitals:    01/21/24 0855 01/21/24 0857 01/21/24 0900 01/21/24 1018  BP:  97/67  99/70  Pulse: 73 72  70  Resp:   16 17  Temp: 98.1 F (36.7 C)   98.2 F (36.8 C)  TempSrc: Oral   Oral  SpO2: 96%  98% 97%  Weight:      Height:        General appearance : Awake, alert, not in any distress. Speech Clear. Not toxic looking HEENT: Atraumatic and Normocephalic, pupils equally reactive to light and accomodation Neck: Supple, no JVD. No cervical lymphadenopathy.  Chest: Good air entry bilaterally, no added sounds  CVS: S1 S2 regular, no murmurs.  Abdomen: Bowel sounds present, Non tender and not distended with no gaurding, rigidity or rebound. Extremities: B/L Lower Ext shows no edema, both legs are warm to touch Neurology: Awake alert, and oriented X 3, CN II-XII intact, Non focal Skin: No Rash  Discharge Instructions  Discharge Instructions     Call MD for:  severe uncontrolled pain   Complete by: As directed    Call MD for:  temperature >100.4   Complete by: As directed    Diet - low sodium heart healthy   Complete by: As directed    Increase activity slowly   Complete by: As directed          The results of significant diagnostics from this hospitalization (including imaging, microbiology, ancillary and laboratory) are listed below for reference.    Significant Diagnostic Studies: DG Chest Portable 1 View Result Date: 01/16/2024 CLINICAL DATA:  33 year old male with sickle cell disease, hypoxia, small right lower lobe pulmonary embolus on CTA yesterday. Query porta calf placement. EXAM: PORTABLE CHEST 1 VIEW COMPARISON:  CTA chest yesterday. Chest radiographs 0048 hours yesterday. And earlier. FINDINGS: Portable AP semi upright view at 0446 hours. Stable right chest power port, with looped catheter tubing at the thoracic inlet which appears to be within the lumen of the vein at the jugular and subclavian venous confluence when judging from the CTA chest yesterday. This Port-A-Cath has  been in place with stable configuration since it was seemingly revised in May of 2023 (see radiographs 09/01/2021, 08/12/2021). Cardiomegaly, mediastinal contours appear stable from yesterday. Larger lung volumes now. No pneumothorax or pleural effusion. Ventilation does appear improved at the right lung base from radiographs yesterday. Otherwise Coarse bilateral pulmonary interstitial opacity is stable. IMPRESSION: 1. Chronic right chest Port-A-Cath with stable configuration since May of 2023 (looped catheter tubing at the thoracic inlet appears to be within the lumen of the jugular and subclavian venous confluence on CT/CTA). 2. Larger lung volumes with improved ventilation at the right lung base from yesterday. Otherwise stable coarse bilateral pulmonary interstitial opacity, Cardiomegaly. Electronically Signed   By: VEAR Hurst M.D.   On: 01/16/2024 05:10   CT Angio Chest PE W and/or Wo Contrast Addendum Date: 01/15/2024 ADDENDUM REPORT: 01/15/2024 04:48 ADDENDUM: Study discussed by telephone with Dr. Trine in the ED on 01/15/2024 at 0419 hours. Electronically Signed   By: VEAR Hurst M.D.   On: 01/15/2024 04:48   Result Date: 01/15/2024 CLINICAL DATA:  33 year old male with  hypoxia.  Sickle cell disease. EXAM: CT ANGIOGRAPHY CHEST WITH CONTRAST TECHNIQUE: Multidetector CT imaging of the chest was performed using the standard protocol during bolus administration of intravenous contrast. Multiplanar CT image reconstructions and MIPs were obtained to evaluate the vascular anatomy. RADIATION DOSE REDUCTION: This exam was performed according to the departmental dose-optimization program which includes automated exposure control, adjustment of the mA and/or kV according to patient size and/or use of iterative reconstruction technique. CONTRAST:  75mL OMNIPAQUE  IOHEXOL  350 MG/ML SOLN COMPARISON:  Chest radiographs 0048 hours today. CTA chest 01/04/2024 earlier. FINDINGS: Cardiovascular: Good contrast bolus timing in the  pulmonary arterial tree. No central or lobar pulmonary artery filling defect. Right lower lobe segmental or subsegmental low-density pulmonary artery filling defect on series 10, image 253 is consistent with pulmonary artery thrombus. But no other pulmonary artery filling defect is identified. Stable mild to moderate cardiomegaly. No pericardial effusion. Negative visible aorta. Right chest Port-A-Cath remains in place. Mediastinum/Nodes: Stable reactive appearing mediastinal and hilar lymph nodes. Lungs/Pleura: Improved lung volumes compared to 01/04/2024. Major airways remain patent, other than trace retained secretions along the anterior wall of the trachea at the thoracic inlet on the right widespread pulmonary mosaic attenuation with superimposed areas of overt pulmonary scarring and architectural distortion including the lung apices, both lung bases. No pleural effusion. No consolidation. Nor is worsening ventilation compared to 01/04/2024. Upper Abdomen: Small calcified splenic remnant redemonstrated. Negative visible liver, pancreas, adrenal glands, kidneys, and fluid containing stomach in the upper abdomen. Musculoskeletal: Course bone mineralization throughout the visible skeleton. Maintained thoracic vertebral height and endplates. No acute or suspicious osseous lesion. Review of the MIP images confirms the above findings. IMPRESSION: 1. Positive for a small right lower lobe segmental or subsegmental pulmonary embolus. No other pulmonary artery filling defect identified. 2. Sequelae of Sickle Cell Disease including cardiomegaly, splenic atrophy, skeletal changes. Widespread pulmonary mosaic attenuation with scattered scarring also most likely chronic small vessel disease related. No areas of acute opacity or worsening ventilation compared to the CTA 01/04/2024. No pleural effusion. Stable reactive appearing mediastinal lymph nodes. Electronically Signed: By: VEAR Hurst M.D. On: 01/15/2024 04:15   DG Chest  2 View Result Date: 01/15/2024 CLINICAL DATA:  Low O2. EXAM: CHEST - 2 VIEW COMPARISON:  January 03, 2024 FINDINGS: There is stable right-sided venous Port-A-Cath positioning. The heart size and mediastinal contours are within normal limits. Chronic appearing increased interstitial lung markings are seen. Mild, hazy superimposed opacities are seen involving the mid to lower lung fields, bilaterally. This is minimally increased in severity when compared to the prior study. No pleural effusion or pneumothorax is identified. No acute osseous abnormalities are identified. IMPRESSION: Chronic appearing increased interstitial lung markings with additional findings that may represent a superimposed atypical infectious or inflammatory process involving the mid to lower lung fields, bilaterally. Electronically Signed   By: Suzen Dials M.D.   On: 01/15/2024 01:23   CT Angio Chest PE W and/or Wo Contrast Result Date: 01/04/2024 CLINICAL DATA:  Sickle cell pain. Chest pain. Clinical concern for pulmonary embolus. EXAM: CT ANGIOGRAPHY CHEST WITH CONTRAST TECHNIQUE: Multidetector CT imaging of the chest was performed using the standard protocol during bolus administration of intravenous contrast. Multiplanar CT image reconstructions and MIPs were obtained to evaluate the vascular anatomy. RADIATION DOSE REDUCTION: This exam was performed according to the departmental dose-optimization program which includes automated exposure control, adjustment of the mA and/or kV according to patient size and/or use of iterative reconstruction technique. CONTRAST:  75mL  OMNIPAQUE  IOHEXOL  350 MG/ML SOLN COMPARISON:  10/15/2023 FINDINGS: Cardiovascular: Heart size upper normal. No substantial pericardial effusion. Right Port-A-Cath tip is positioned in the distal SVC near the RA junction. There is no filling defect within the opacified pulmonary arteries to suggest the presence of an acute pulmonary embolus. Enlargement of the  pulmonary outflow tract/main pulmonary arteries suggests pulmonary arterial hypertension. Mediastinum/Nodes: Scattered small mediastinal and hilar lymph nodes evident. The esophagus has normal imaging features. Small axillary lymph nodes evident bilaterally. Lungs/Pleura: Interval progression of patchy ground-glass opacity in both lungs with scattered areas of architectural distortion and atelectasis. Interlobular septal thickening noted towards the lung bases. 4 mm left upper lobe nodule on 45/12 is new in the interval. Upper Abdomen: Auto splenectomy. Musculoskeletal: No worrisome lytic or sclerotic osseous abnormality. Review of the MIP images confirms the above findings. IMPRESSION: 1. No CT evidence for acute pulmonary embolus. 2. Interval progression of patchy ground-glass opacity in both lungs with scattered areas of architectural distortion and atelectasis. Findings are basilar predominant with associated interlobular septal thickening. Imaging features are nonspecific but can be seen in the setting of acute chest syndrome. Pulmonary edema or atypical infection would also be considerations. 3. Enlargement of the pulmonary outflow tract/main pulmonary arteries suggests pulmonary arterial hypertension. 4. 4 mm left upper lobe pulmonary nodule, new in the interval. No follow-up needed if patient is low-risk.This recommendation follows the consensus statement: Guidelines for Management of Incidental Pulmonary Nodules Detected on CT Images: From the Fleischner Society 2017; Radiology 2017; 284:228-243. Electronically Signed   By: Camellia Candle M.D.   On: 01/04/2024 08:14   DG Chest 2 View Result Date: 01/03/2024 CLINICAL DATA:  hypoxia EXAM: CHEST - 2 VIEW COMPARISON:  Chest x-ray 12/15/2023 FINDINGS: Right chest wall Port-A-Cath with tip overlying the distal superior vena cava/region of the superior caval junction. The heart and mediastinal contours are within normal limits. No focal consolidation. Chronic  coarsened interstitial markings. No overt pulmonary edema. No pleural effusion. No pneumothorax. No acute osseous abnormality. IMPRESSION: No acute cardiopulmonary disease in a patient with underlying chronic coarsened interstitial markings. Electronically Signed   By: Morgane  Naveau M.D.   On: 01/03/2024 01:48    Microbiology: No results found for this or any previous visit (from the past 240 hours).   Labs: Basic Metabolic Panel: Recent Labs  Lab 01/15/24 0038 01/16/24 0431 01/17/24 0354  NA 139 139 138  K 3.5 3.4* 4.2  CL 109 108 108  CO2 19* 21* 19*  GLUCOSE 91 87 87  BUN 7 7 8   CREATININE 0.84 1.05 0.86  CALCIUM  8.6* 8.4* 8.1*  MG  --   --  1.9  PHOS  --   --  4.6   Liver Function Tests: Recent Labs  Lab 01/15/24 0038 01/16/24 0431 01/17/24 0354  AST 31 27 42*  ALT 10 15 22   ALKPHOS 61 55 57  BILITOT 10.0* 10.2* 6.5*  PROT 7.6 7.9 6.9  ALBUMIN 4.1 3.7 3.8   No results for input(s): LIPASE, AMYLASE in the last 168 hours. No results for input(s): AMMONIA in the last 168 hours. CBC: Recent Labs  Lab 01/15/24 0038 01/16/24 0431 01/17/24 0354 01/18/24 0117 01/19/24 0112 01/20/24 0405 01/21/24 0357  WBC 11.2* 9.7 12.2* 15.5* 13.4* 14.6* 11.5*  NEUTROABS 5.0 3.9  --   --   --   --   --   HGB 8.8* 8.6* 7.6* 7.8* 7.7* 7.3* 7.0*  HCT 24.8* 21.0* 22.0* 21.4* 21.2* 20.1* 19.0*  MCV  102.9* 100.0 101.4* 99.5 98.6 98.5 98.4  PLT 294 239 214 194 238 269 280   Cardiac Enzymes: No results for input(s): CKTOTAL, CKMB, CKMBINDEX, TROPONINI in the last 168 hours. BNP: Invalid input(s): POCBNP CBG: No results for input(s): GLUCAP in the last 168 hours.  Time coordinating discharge: 50 minutes  Signed:  [name] Cover NP  [facility] 01/21/2024, 12:13 PM

## 2024-01-21 NOTE — Progress Notes (Addendum)
 Patient preparing to go home when he stated I just don't feel right. HR was 127-130 but had decreased. Pt denied worsening chest/generalized pain. On assessment the patient resting in bed. No changes noted from am assessment. SS NP contacted, no new orders at this time and okay to proceed with discharge. Pt encouraged to come back to the emergency department should symptoms (CP, SOB) worsen or new develop (visual changes, dizziness, H/A weakness, N/V).  Additionally pt encouraged to wear oxygen  at all times due to desaturation.    01/21/24 1420  Vitals  BP 104/76  MAP (mmHg) 86  BP Location Left Arm  BP Method Automatic  Patient Position (if appropriate) Sitting  Pulse Rate (!) 114  Pulse Rate Source Monitor  Resp 16  MEWS COLOR  MEWS Score Color Yellow  Oxygen  Therapy  SpO2 94 %  O2 Device Nasal Cannula  O2 Flow Rate (L/min) 5 L/min  MEWS Score  MEWS Temp 0  MEWS Systolic 0  MEWS Pulse 2  MEWS RR 0  MEWS LOC 0  MEWS Score 2

## 2024-01-24 ENCOUNTER — Telehealth: Payer: Self-pay

## 2024-01-24 NOTE — Transitions of Care (Post Inpatient/ED Visit) (Signed)
   01/24/2024  Name: JUSTN QUALE MRN: [id number] DOB: 08-Oct-1990  Today's TOC FU Call Status: Today's TOC FU Call Status:: Unsuccessful Call (1st Attempt) Unsuccessful Call (1st Attempt) Date: 01/24/24  Attempted to reach the patient regarding the most recent Inpatient/ED visit.  Follow Up Plan: Additional outreach attempts will be made to reach the patient to complete the Transitions of Care (Post Inpatient/ED visit) call.   Arvin Seip RN, BSN, CCM [employer], Population Health Case Manager Phone: [phone]

## 2024-01-25 ENCOUNTER — Telehealth: Payer: Self-pay

## 2024-01-25 NOTE — Transitions of Care (Post Inpatient/ED Visit) (Signed)
   01/25/2024  Name: Martin [name] MRN: [id number] DOB: 1990-12-03  Today's TOC FU Call Status: Today's TOC FU Call Status:: Unsuccessful Call (2nd Attempt) Unsuccessful Call (2nd Attempt) Date: 01/25/24  Attempted to reach the patient regarding the most recent Inpatient/ED visit.  Follow Up Plan: Additional outreach attempts will be made to reach the patient to complete the Transitions of Care (Post Inpatient/ED visit) call.   Arvin Seip RN, BSN, CCM [employer], Population Health Case Manager Phone: [phone]

## 2024-01-26 ENCOUNTER — Telehealth: Payer: Self-pay

## 2024-01-26 NOTE — Transitions of Care (Post Inpatient/ED Visit) (Signed)
   01/26/2024  Name: Martin [name] MRN: [id number] DOB: 11/09/1990  Today's TOC FU Call Status: Today's TOC FU Call Status:: Unsuccessful Call (3rd Attempt) Unsuccessful Call (3rd Attempt) Date: 01/26/24  Attempted to reach the patient regarding the most recent Inpatient/ED visit.  Follow Up Plan: No further outreach attempts will be made at this time. We have been unable to contact the patient.  Arvin Seip RN, BSN, CCM [employer], Population Health Case Manager Phone: [phone]

## 2024-01-28 ENCOUNTER — Emergency Department (HOSPITAL_COMMUNITY)
Admission: EM | Admit: 2024-01-28 | Discharge: 2024-01-28 | Disposition: A | Discharge status: Home / Self Care | Payer: Self-pay | Attending: Emergency Medicine | Admitting: Emergency Medicine

## 2024-01-28 ENCOUNTER — Other Ambulatory Visit: Payer: Self-pay

## 2024-01-28 DIAGNOSIS — Z7901 Long term (current) use of anticoagulants: Secondary | ICD-10-CM | POA: Insufficient documentation

## 2024-01-28 DIAGNOSIS — D5700 Hb-SS disease with crisis, unspecified: Secondary | ICD-10-CM | POA: Insufficient documentation

## 2024-01-28 LAB — CBC WITH DIFFERENTIAL/PLATELET
Abs Immature Granulocytes: 0.08 K/uL — ABNORMAL HIGH (ref 0.00–0.07)
Basophils Absolute: 0.1 K/uL (ref 0.0–0.1)
Basophils Relative: 1 %
Eosinophils Absolute: 0.3 K/uL (ref 0.0–0.5)
Eosinophils Relative: 2 %
HCT: 25.4 % — ABNORMAL LOW (ref 39.0–52.0)
Hemoglobin: 8.9 g/dL — ABNORMAL LOW (ref 13.0–17.0)
Immature Granulocytes: 1 %
Lymphocytes Relative: 43 %
Lymphs Abs: 5.4 K/uL — ABNORMAL HIGH (ref 0.7–4.0)
MCH: 37.9 pg — ABNORMAL HIGH (ref 26.0–34.0)
MCHC: 35 g/dL (ref 30.0–36.0)
MCV: 108.1 fL — ABNORMAL HIGH (ref 80.0–100.0)
Monocytes Absolute: 1 K/uL (ref 0.1–1.0)
Monocytes Relative: 8 %
Neutro Abs: 5.8 K/uL (ref 1.7–7.7)
Neutrophils Relative %: 45 %
Platelets: 354 K/uL (ref 150–400)
RBC: 2.35 MIL/uL — ABNORMAL LOW (ref 4.22–5.81)
RDW: 29.8 % — ABNORMAL HIGH (ref 11.5–15.5)
WBC: 12.6 K/uL — ABNORMAL HIGH (ref 4.0–10.5)
nRBC: 86.5 % — ABNORMAL HIGH (ref 0.0–0.2)

## 2024-01-28 LAB — COMPREHENSIVE METABOLIC PANEL WITH GFR
ALT: 12 U/L (ref 0–44)
AST: 38 U/L (ref 15–41)
Albumin: 4 g/dL (ref 3.5–5.0)
Alkaline Phosphatase: 59 U/L (ref 38–126)
Anion gap: 11 (ref 5–15)
BUN: 14 mg/dL (ref 6–20)
CO2: 21 mmol/L — ABNORMAL LOW (ref 22–32)
Calcium: 8.7 mg/dL — ABNORMAL LOW (ref 8.9–10.3)
Chloride: 110 mmol/L (ref 98–111)
Creatinine, Ser: 0.89 mg/dL (ref 0.61–1.24)
GFR, Estimated: >60 mL/min (ref >60)
Glucose, Bld: 86 mg/dL (ref 70–99)
Potassium: 4 mmol/L (ref 3.5–5.1)
Sodium: 142 mmol/L (ref 135–145)
Total Bilirubin: 7 mg/dL — ABNORMAL HIGH (ref 0.0–1.2)
Total Protein: 7.6 g/dL (ref 6.5–8.1)

## 2024-01-28 LAB — RETICULOCYTES
Immature Retic Fract: 26.6 % — ABNORMAL HIGH (ref 2.3–15.9)
RBC.: 2.28 MIL/uL — ABNORMAL LOW (ref 4.22–5.81)
Retic Count, Absolute: 638.4 K/uL — ABNORMAL HIGH (ref 19.0–186.0)
Retic Ct Pct: 28 % — ABNORMAL HIGH (ref 0.4–3.1)

## 2024-01-28 MED ORDER — DEXTROSE-SODIUM CHLORIDE 5-0.45 % IV SOLN: INTRAVENOUS | Status: DC

## 2024-01-28 MED ORDER — HEPARIN SOD (PORK) LOCK FLUSH 100 UNIT/ML IV SOLN
500.0000 [IU] | Freq: Once | INTRAVENOUS | Status: AC
  Administered 2024-01-28: 500 [IU]
  Filled 2024-01-28: qty 5

## 2024-01-28 MED ORDER — HYDROMORPHONE HCL 1 MG/ML IJ SOLN
2.0000 mg | INTRAMUSCULAR | Status: AC
  Administered 2024-01-28: 2 mg via INTRAVENOUS
  Filled 2024-01-28: qty 2

## 2024-01-28 MED ORDER — ONDANSETRON HCL 4 MG/2ML IJ SOLN: 4.0000 mg | INTRAMUSCULAR | Status: DC | PRN

## 2024-01-28 MED ORDER — DIPHENHYDRAMINE HCL 25 MG PO CAPS
25.0000 mg | ORAL_CAPSULE | ORAL | Status: DC
  Filled 2024-01-28: qty 1

## 2024-01-28 NOTE — ED Triage Notes (Incomplete)
 Patient c/o sickle cell pain started tonight. Patient report taking PRN medication with no relief. Patient denies SOB and chest pain.

## 2024-01-28 NOTE — ED Notes (Signed)
 PT request labs to come from port

## 2024-01-28 NOTE — ED Provider Notes (Signed)
 [place] EMERGENCY DEPARTMENT AT [facility] Provider Note   CSN: [id number] Arrival date & time: 01/28/24  9747     Patient presents with: Sickle Cell Pain Crisis   Martin [name] is a 33 y.o. male.   The history is provided by the patient.  Sickle Cell Pain Crisis Location:  Back and hip Severity:  Severe Onset quality:  Sudden Duration: hours. Similar to previous crisis episodes: yes   Timing:  Constant Progression:  Unchanged Chronicity:  Recurrent Sickle cell genotype:  SS Context: not dehydration, not non-compliance and not stress   Relieved by:  Nothing Worsened by:  Nothing Ineffective treatments:  Prescription drugs Associated symptoms: no chest pain, no cough, no fever, no shortness of breath, no swelling of legs, no vision change and no vomiting   Risk factors: no renal disease        Prior to Admission medications   Medication Sig Start Date End Date Taking? Authorizing Provider  abacavir -dolutegravir -lamiVUDine  (TRIUMEQ ) 600-50-300 MG tablet Take 1 tablet by mouth daily. Patient not taking: Reported on 01/17/2024 08/13/23   Pearlean Manus, MD  albuterol  (VENTOLIN  HFA) 108 (90 Base) MCG/ACT inhaler Inhale 2 puffs into the lungs every 6 (six) hours as needed for wheezing or shortness of breath. Patient not taking: Reported on 10/20/2023 08/13/23   Pearlean Manus, MD  Cholecalciferol  (VITAMIN D3) 125 MCG (5000 UT) CAPS Take 5,000 Units by mouth every Tuesday. Patient not taking: Reported on 01/17/2024    [provider]  cyclobenzaprine  (FLEXERIL ) 10 MG tablet Take 10 mg by mouth 2 (two) times daily as needed for muscle spasms. Patient not taking: Reported on 01/17/2024 12/22/22   [provider]  Deferasirox  (JADENU ) 360 MG TABS Take 2 tablets (720 mg total) by mouth daily. Patient not taking: Reported on 01/04/2024 11/05/23   [name], [name], [name]  diphenhydrAMINE  (BENADRYL ) 25 MG tablet Take 1 tablet (25 mg total) by mouth  every 4 (four) hours as needed for itching. Patient not taking: Reported on 01/17/2024 08/13/23   Pearlean Manus, MD  folic acid  (FOLVITE ) 400 MCG tablet Take 1 tablet (400 mcg total) by mouth daily. Patient not taking: Reported on 01/17/2024 08/13/23   Pearlean Manus, MD  gabapentin  (NEURONTIN ) 300 MG capsule Take 1 capsule (300 mg total) by mouth at bedtime as needed (nerve pain). Patient not taking: Reported on 01/04/2024 08/13/23   Pearlean Manus, MD  guaiFENesin  (MUCINEX ) 600 MG 12 hr tablet Take 1 tablet (600 mg total) by mouth 2 (two) times daily. Patient not taking: Reported on 01/17/2024 08/13/23 08/12/24  Pearlean Manus, MD  hydroxyurea  (HYDREA ) 500 MG capsule Take 4 capsules (2,000 mg total) by mouth at bedtime. Patient not taking: Reported on 01/17/2024 08/13/23   Pearlean Manus, MD  NARCAN  4 MG/0.1ML LIQD nasal spray kit Place 1 spray into the nose as needed (respiratory distress / opioid overdose). Patient not taking: Reported on 01/17/2024 01/07/23   [provider]  oxyCODONE -acetaminophen  (PERCOCET) 10-325 MG tablet Take 1 tablet by mouth every 4 (four) hours as needed for up to 16 days for pain. 01/15/24 01/31/24  [name], [name], [name]  pantoprazole  (PROTONIX ) 40 MG tablet Take 1 tablet (40 mg total) by mouth daily as needed (acid reflux). Patient not taking: Reported on 01/04/2024 08/13/23   Pearlean Manus, MD  RIVAROXABAN TAD) VTE STARTER PACK (15 & 20 MG) Follow package directions: Take one 15mg  tablet by mouth twice a day. On day 22, switch to one 20mg  tablet once a  day. Take with food. 01/21/24   Ijaola, Onyeje M, NP  senna-docusate (SENOKOT-S) 8.6-50 MG tablet Take 2 tablets by mouth at bedtime. Patient not taking: Reported on 01/11/2024 08/13/23   Pearlean Manus, MD  triamcinolone  cream (KENALOG ) 0.1 % Apply 1 Application topically 2 (two) times daily. Patient not taking: Reported on 01/04/2024 01/12/23   Tilford Bertram HERO, [name]  Vitamin D , Ergocalciferol ,  (DRISDOL ) 1.25 MG (50000 UNIT) CAPS capsule Take 1 capsule (50,000 Units total) by mouth every 7 (seven) days. Patient not taking: Reported on 01/17/2024 11/05/23   [name], [name], [name]  Vitamin D , Ergocalciferol , (DRISDOL ) 1.25 MG (50000 UNIT) CAPS capsule Take 1 capsule (50,000 Units total) by mouth every 7 (seven) days. Patient not taking: Reported on 01/04/2024 11/09/23   [name], [name], [name]    Allergies: Ketorolac  tromethamine , Tape, and Wound dressing adhesive    Review of Systems  Constitutional:  Negative for fever.  Respiratory:  Negative for cough and shortness of breath.   Cardiovascular:  Negative for chest pain.  Gastrointestinal:  Negative for vomiting.  Musculoskeletal:  Positive for arthralgias and back pain.  All other systems reviewed and are negative.   Updated Vital Signs BP 103/70   Pulse 82   Temp 98.2 F (36.8 C) (Oral)   Resp 18   SpO2 90%   Physical Exam Vitals and nursing note reviewed.  Constitutional:      General: He is not in acute distress.    Appearance: Normal appearance. He is well-developed. He is not diaphoretic.  HENT:     Head: Normocephalic and atraumatic.     Nose: Nose normal.  Eyes:     Conjunctiva/sclera: Conjunctivae normal.     Pupils: Pupils are equal, round, and reactive to light.  Cardiovascular:     Rate and Rhythm: Normal rate and regular rhythm.  Pulmonary:     Effort: Pulmonary effort is normal.     Breath sounds: Normal breath sounds. No wheezing or rales.  Abdominal:     General: Bowel sounds are normal.     Palpations: Abdomen is soft.     Tenderness: There is no abdominal tenderness. There is no guarding or rebound.  Musculoskeletal:        General: Normal range of motion.     Cervical back: Normal range of motion and neck supple.  Skin:    General: Skin is warm and dry.     Capillary Refill: Capillary refill takes less than 2 seconds.  Neurological:     General: No focal deficit present.     Mental  Status: He is alert and oriented to person, place, and time.     Deep Tendon Reflexes: Reflexes normal.     (all labs ordered are listed, but only abnormal results are displayed) Results for orders placed or performed during the hospital encounter of 01/28/24  Comprehensive metabolic panel   Collection Time: 01/28/24  4:35 AM  Result Value Ref Range   Sodium 142 135 - 145 mmol/L   Potassium 4.0 3.5 - 5.1 mmol/L   Chloride 110 98 - 111 mmol/L   CO2 21 (L) 22 - 32 mmol/L   Glucose, Bld 86 70 - 99 mg/dL   BUN 14 6 - 20 mg/dL   Creatinine, Ser 9.10 0.61 - 1.24 mg/dL   Calcium  8.7 (L) 8.9 - 10.3 mg/dL   Total Protein 7.6 6.5 - 8.1 g/dL   Albumin 4.0 3.5 - 5.0 g/dL   AST 38 15 - 41 U/L  ALT 12 0 - 44 U/L   Alkaline Phosphatase 59 38 - 126 U/L   Total Bilirubin 7.0 (H) 0.0 - 1.2 mg/dL   GFR, Estimated >39 >39 mL/min   Anion gap 11 5 - 15   DG Chest Portable 1 View Result Date: 01/16/2024 CLINICAL DATA:  33 year old male with sickle cell disease, hypoxia, small right lower lobe pulmonary embolus on CTA yesterday. Query porta calf placement. EXAM: PORTABLE CHEST 1 VIEW COMPARISON:  CTA chest yesterday. Chest radiographs 0048 hours yesterday. And earlier. FINDINGS: Portable AP semi upright view at 0446 hours. Stable right chest power port, with looped catheter tubing at the thoracic inlet which appears to be within the lumen of the vein at the jugular and subclavian venous confluence when judging from the CTA chest yesterday. This Port-A-Cath has been in place with stable configuration since it was seemingly revised in May of 2023 (see radiographs 09/01/2021, 08/12/2021). Cardiomegaly, mediastinal contours appear stable from yesterday. Larger lung volumes now. No pneumothorax or pleural effusion. Ventilation does appear improved at the right lung base from radiographs yesterday. Otherwise Coarse bilateral pulmonary interstitial opacity is stable. IMPRESSION: 1. Chronic right chest Port-A-Cath with  stable configuration since May of 2023 (looped catheter tubing at the thoracic inlet appears to be within the lumen of the jugular and subclavian venous confluence on CT/CTA). 2. Larger lung volumes with improved ventilation at the right lung base from yesterday. Otherwise stable coarse bilateral pulmonary interstitial opacity, Cardiomegaly. Electronically Signed   By: VEAR Hurst M.D.   On: 01/16/2024 05:10   CT Angio Chest PE W and/or Wo Contrast Addendum Date: 01/15/2024 ADDENDUM REPORT: 01/15/2024 04:48 ADDENDUM: Study discussed by telephone with Dr. Trine in the ED on 01/15/2024 at 0419 hours. Electronically Signed   By: VEAR Hurst M.D.   On: 01/15/2024 04:48   Result Date: 01/15/2024 CLINICAL DATA:  33 year old male with hypoxia.  Sickle cell disease. EXAM: CT ANGIOGRAPHY CHEST WITH CONTRAST TECHNIQUE: Multidetector CT imaging of the chest was performed using the standard protocol during bolus administration of intravenous contrast. Multiplanar CT image reconstructions and MIPs were obtained to evaluate the vascular anatomy. RADIATION DOSE REDUCTION: This exam was performed according to the departmental dose-optimization program which includes automated exposure control, adjustment of the mA and/or kV according to patient size and/or use of iterative reconstruction technique. CONTRAST:  75mL OMNIPAQUE  IOHEXOL  350 MG/ML SOLN COMPARISON:  Chest radiographs 0048 hours today. CTA chest 01/04/2024 earlier. FINDINGS: Cardiovascular: Good contrast bolus timing in the pulmonary arterial tree. No central or lobar pulmonary artery filling defect. Right lower lobe segmental or subsegmental low-density pulmonary artery filling defect on series 10, image 253 is consistent with pulmonary artery thrombus. But no other pulmonary artery filling defect is identified. Stable mild to moderate cardiomegaly. No pericardial effusion. Negative visible aorta. Right chest Port-A-Cath remains in place. Mediastinum/Nodes: Stable reactive  appearing mediastinal and hilar lymph nodes. Lungs/Pleura: Improved lung volumes compared to 01/04/2024. Major airways remain patent, other than trace retained secretions along the anterior wall of the trachea at the thoracic inlet on the right widespread pulmonary mosaic attenuation with superimposed areas of overt pulmonary scarring and architectural distortion including the lung apices, both lung bases. No pleural effusion. No consolidation. Nor is worsening ventilation compared to 01/04/2024. Upper Abdomen: Small calcified splenic remnant redemonstrated. Negative visible liver, pancreas, adrenal glands, kidneys, and fluid containing stomach in the upper abdomen. Musculoskeletal: Course bone mineralization throughout the visible skeleton. Maintained thoracic vertebral height and endplates. No acute or suspicious  osseous lesion. Review of the MIP images confirms the above findings. IMPRESSION: 1. Positive for a small right lower lobe segmental or subsegmental pulmonary embolus. No other pulmonary artery filling defect identified. 2. Sequelae of Sickle Cell Disease including cardiomegaly, splenic atrophy, skeletal changes. Widespread pulmonary mosaic attenuation with scattered scarring also most likely chronic small vessel disease related. No areas of acute opacity or worsening ventilation compared to the CTA 01/04/2024. No pleural effusion. Stable reactive appearing mediastinal lymph nodes. Electronically Signed: By: VEAR Hurst M.D. On: 01/15/2024 04:15   DG Chest 2 View Result Date: 01/15/2024 CLINICAL DATA:  Low O2. EXAM: CHEST - 2 VIEW COMPARISON:  January 03, 2024 FINDINGS: There is stable right-sided venous Port-A-Cath positioning. The heart size and mediastinal contours are within normal limits. Chronic appearing increased interstitial lung markings are seen. Mild, hazy superimposed opacities are seen involving the mid to lower lung fields, bilaterally. This is minimally increased in severity when compared  to the prior study. No pleural effusion or pneumothorax is identified. No acute osseous abnormalities are identified. IMPRESSION: Chronic appearing increased interstitial lung markings with additional findings that may represent a superimposed atypical infectious or inflammatory process involving the mid to lower lung fields, bilaterally. Electronically Signed   By: Suzen Dials M.D.   On: 01/15/2024 01:23   CT Angio Chest PE W and/or Wo Contrast Result Date: 01/04/2024 CLINICAL DATA:  Sickle cell pain. Chest pain. Clinical concern for pulmonary embolus. EXAM: CT ANGIOGRAPHY CHEST WITH CONTRAST TECHNIQUE: Multidetector CT imaging of the chest was performed using the standard protocol during bolus administration of intravenous contrast. Multiplanar CT image reconstructions and MIPs were obtained to evaluate the vascular anatomy. RADIATION DOSE REDUCTION: This exam was performed according to the departmental dose-optimization program which includes automated exposure control, adjustment of the mA and/or kV according to patient size and/or use of iterative reconstruction technique. CONTRAST:  75mL OMNIPAQUE  IOHEXOL  350 MG/ML SOLN COMPARISON:  10/15/2023 FINDINGS: Cardiovascular: Heart size upper normal. No substantial pericardial effusion. Right Port-A-Cath tip is positioned in the distal SVC near the RA junction. There is no filling defect within the opacified pulmonary arteries to suggest the presence of an acute pulmonary embolus. Enlargement of the pulmonary outflow tract/main pulmonary arteries suggests pulmonary arterial hypertension. Mediastinum/Nodes: Scattered small mediastinal and hilar lymph nodes evident. The esophagus has normal imaging features. Small axillary lymph nodes evident bilaterally. Lungs/Pleura: Interval progression of patchy ground-glass opacity in both lungs with scattered areas of architectural distortion and atelectasis. Interlobular septal thickening noted towards the lung bases. 4  mm left upper lobe nodule on 45/12 is new in the interval. Upper Abdomen: Auto splenectomy. Musculoskeletal: No worrisome lytic or sclerotic osseous abnormality. Review of the MIP images confirms the above findings. IMPRESSION: 1. No CT evidence for acute pulmonary embolus. 2. Interval progression of patchy ground-glass opacity in both lungs with scattered areas of architectural distortion and atelectasis. Findings are basilar predominant with associated interlobular septal thickening. Imaging features are nonspecific but can be seen in the setting of acute chest syndrome. Pulmonary edema or atypical infection would also be considerations. 3. Enlargement of the pulmonary outflow tract/main pulmonary arteries suggests pulmonary arterial hypertension. 4. 4 mm left upper lobe pulmonary nodule, new in the interval. No follow-up needed if patient is low-risk.This recommendation follows the consensus statement: Guidelines for Management of Incidental Pulmonary Nodules Detected on CT Images: From the Fleischner Society 2017; Radiology 2017; 284:228-243. Electronically Signed   By: Camellia Candle M.D.   On: 01/04/2024 08:14  DG Chest 2 View Result Date: 01/03/2024 CLINICAL DATA:  hypoxia EXAM: CHEST - 2 VIEW COMPARISON:  Chest x-ray 12/15/2023 FINDINGS: Right chest wall Port-A-Cath with tip overlying the distal superior vena cava/region of the superior caval junction. The heart and mediastinal contours are within normal limits. No focal consolidation. Chronic coarsened interstitial markings. No overt pulmonary edema. No pleural effusion. No pneumothorax. No acute osseous abnormality. IMPRESSION: No acute cardiopulmonary disease in a patient with underlying chronic coarsened interstitial markings. Electronically Signed   By: Morgane  Naveau M.D.   On: 01/03/2024 01:48     Radiology: No results found.   Procedures   Medications Ordered in the ED  dextrose  5 % and 0.45 % NaCl infusion ( Intravenous New Bag/Given  01/28/24 0456)  HYDROmorphone  (DILAUDID ) injection 2 mg (has no administration in time range)  diphenhydrAMINE  (BENADRYL ) capsule 25 mg (has no administration in time range)  ondansetron  (ZOFRAN ) injection 4 mg (has no administration in time range)  HYDROmorphone  (DILAUDID ) injection 2 mg (2 mg Intravenous Given 01/28/24 0450)  HYDROmorphone  (DILAUDID ) injection 2 mg (2 mg Intravenous Given 01/28/24 0526)                                    Medical Decision Making Patient with sickle cell disease presenting for pain in back and hips   Amount and/or Complexity of Data Reviewed External Data Reviewed: labs and notes.    Details: Previous notes and labs from previous  Labs: ordered.    Details: Normal sodium 142, normal potassium 4, normal creatinine elevated bilirubin 7 but is at baseline   Risk Prescription drug management. Risk Details: Protocol ordered prior to seeing patient.  Patient is resting comfortably in the room.  Patient does not want to be admitted following protocol.  Stable for discharge with close follow up    Final diagnoses:  None   No signs of systemic illness or infection. The patient is nontoxic-appearing on exam and vital signs are within normal limits.  I have reviewed the triage vital signs and the nursing notes. Pertinent labs & imaging results that were available during my care of the patient were reviewed by me and considered in my medical decision making (see chart for details). After history, exam, and medical workup I feel the patient has been appropriately medically screened and is safe for discharge home. Pertinent diagnoses were discussed with the patient. Patient was given return precautions.  ED Discharge Orders     None          Shereece Wellborn, MD 01/28/24 [phone]

## 2024-01-31 ENCOUNTER — Other Ambulatory Visit: Payer: Self-pay

## 2024-01-31 ENCOUNTER — Encounter (HOSPITAL_COMMUNITY): Payer: Self-pay

## 2024-01-31 ENCOUNTER — Emergency Department (HOSPITAL_COMMUNITY)
Admission: EM | Admit: 2024-01-31 | Discharge: 2024-01-31 | Disposition: A | Discharge status: Home / Self Care | Payer: Self-pay | Attending: Emergency Medicine | Admitting: Emergency Medicine

## 2024-01-31 DIAGNOSIS — D5700 Hb-SS disease with crisis, unspecified: Secondary | ICD-10-CM

## 2024-01-31 DIAGNOSIS — D72829 Elevated white blood cell count, unspecified: Secondary | ICD-10-CM | POA: Insufficient documentation

## 2024-01-31 DIAGNOSIS — Z7901 Long term (current) use of anticoagulants: Secondary | ICD-10-CM | POA: Insufficient documentation

## 2024-01-31 DIAGNOSIS — D57219 Sickle-cell/Hb-C disease with crisis, unspecified: Secondary | ICD-10-CM | POA: Insufficient documentation

## 2024-01-31 LAB — CBC WITH DIFFERENTIAL/PLATELET
Abs Immature Granulocytes: 0.04 K/uL (ref 0.00–0.07)
Basophils Absolute: 0.1 K/uL (ref 0.0–0.1)
Basophils Relative: 1 %
Eosinophils Absolute: 0.2 K/uL (ref 0.0–0.5)
Eosinophils Relative: 2 %
HCT: 25.1 % — ABNORMAL LOW (ref 39.0–52.0)
Hemoglobin: 8.9 g/dL — ABNORMAL LOW (ref 13.0–17.0)
Immature Granulocytes: 0 %
Lymphocytes Relative: 49 %
Lymphs Abs: 6.2 K/uL — ABNORMAL HIGH (ref 0.7–4.0)
MCH: 35.9 pg — ABNORMAL HIGH (ref 26.0–34.0)
MCHC: 35.5 g/dL (ref 30.0–36.0)
MCV: 101.2 fL — ABNORMAL HIGH (ref 80.0–100.0)
Monocytes Absolute: 1.3 K/uL — ABNORMAL HIGH (ref 0.1–1.0)
Monocytes Relative: 11 %
Neutro Abs: 4.6 K/uL (ref 1.7–7.7)
Neutrophils Relative %: 37 %
Platelets: 312 K/uL (ref 150–400)
RBC: 2.48 MIL/uL — ABNORMAL LOW (ref 4.22–5.81)
RDW: 27.5 % — ABNORMAL HIGH (ref 11.5–15.5)
WBC: 12.4 K/uL — ABNORMAL HIGH (ref 4.0–10.5)
nRBC: 2.3 % — ABNORMAL HIGH (ref 0.0–0.2)

## 2024-01-31 LAB — RETICULOCYTES
RBC.: 2.52 MIL/uL — ABNORMAL LOW (ref 4.22–5.81)
Retic Ct Pct: >30 % — ABNORMAL HIGH (ref 0.4–3.1)

## 2024-01-31 LAB — COMPREHENSIVE METABOLIC PANEL WITH GFR
ALT: 14 U/L (ref 0–44)
AST: 38 U/L (ref 15–41)
Albumin: 4.2 g/dL (ref 3.5–5.0)
Alkaline Phosphatase: 57 U/L (ref 38–126)
Anion gap: 11 (ref 5–15)
BUN: 12 mg/dL (ref 6–20)
CO2: 22 mmol/L (ref 22–32)
Calcium: 8.9 mg/dL (ref 8.9–10.3)
Chloride: 108 mmol/L (ref 98–111)
Creatinine, Ser: 1.02 mg/dL (ref 0.61–1.24)
GFR, Estimated: >60 mL/min (ref >60)
Glucose, Bld: 137 mg/dL — ABNORMAL HIGH (ref 70–99)
Potassium: 4 mmol/L (ref 3.5–5.1)
Sodium: 141 mmol/L (ref 135–145)
Total Bilirubin: 10.4 mg/dL — ABNORMAL HIGH (ref 0.0–1.2)
Total Protein: 7.7 g/dL (ref 6.5–8.1)

## 2024-01-31 MED ORDER — HYDROMORPHONE HCL 1 MG/ML IJ SOLN
2.0000 mg | INTRAMUSCULAR | Status: AC
  Administered 2024-01-31: 2 mg via INTRAVENOUS
  Filled 2024-01-31: qty 2

## 2024-01-31 MED ORDER — HEPARIN SOD (PORK) LOCK FLUSH 100 UNIT/ML IV SOLN
500.0000 [IU] | Freq: Once | INTRAVENOUS | Status: AC
  Administered 2024-01-31: 500 [IU]
  Filled 2024-01-31: qty 5

## 2024-01-31 MED ORDER — DEXTROSE-SODIUM CHLORIDE 5-0.45 % IV SOLN: INTRAVENOUS | Status: DC

## 2024-01-31 MED ORDER — DIPHENHYDRAMINE HCL 25 MG PO CAPS
25.0000 mg | ORAL_CAPSULE | ORAL | Status: DC
  Filled 2024-01-31: qty 1

## 2024-01-31 NOTE — ED Triage Notes (Signed)
 Pt arrived via POV c/o sickle cell pain crisis. 10/10 pain all over that began Sunday morning.

## 2024-01-31 NOTE — Telephone Encounter (Unsigned)
 Copied from CRM [id number]. Topic: Clinical - Medication Refill >> Jan 31, 2024  1:04 PM Harlene ORN wrote: Medication: oxyCODONE -acetaminophen  (PERCOCET) 10-325 MG tablet  Has the patient contacted their pharmacy? Yes (Agent: If no, request that the patient contact the pharmacy for the refill. If patient does not wish to contact the pharmacy document the reason why and proceed with request.) (Agent: If yes, when and what did the pharmacy advise?)  This is the patient's preferred pharmacy:  [id number] - [place], [place] - [address] AT [facility] [address] [place] [address] Phone: [phone] Fax: [phone]  Is this the correct pharmacy for this prescription? Yes If no, delete pharmacy and type the correct one.   Has the prescription been filled recently? No  Is the patient out of the medication? Yes  Has the patient been seen for an appointment in the last year OR does the patient have an upcoming appointment? Yes  Can we respond through MyChart? Yes  Agent: Please be advised that Rx refills may take up to 3 business days. We ask that you follow-up with your pharmacy.

## 2024-01-31 NOTE — ED Provider Notes (Addendum)
 [place] [facility] AT [facility] Provider Note   CSN: [id number] Arrival date & time: 01/31/24  0301     Patient presents with: Sickle Cell Pain Crisis   Martin [name] is a 33 y.o. male.   The history is provided by the patient.  Sickle Cell Pain Crisis Location:  Back and hip Severity:  Severe Onset quality:  Gradual Duration:  1 day Similar to previous crisis episodes: yes   Timing:  Constant Progression:  Worsening Chronicity:  Recurrent Sickle cell genotype:  SS Context: cold exposure   Relieved by:  Nothing Worsened by:  Nothing Ineffective treatments:  None tried Associated symptoms: no chest pain, no fever, no shortness of breath, no swelling of legs and no wheezing   Risk factors: no frequent admissions for fever        Prior to Admission medications   Medication Sig Start Date End Date Taking? Authorizing Provider  abacavir -dolutegravir -lamiVUDine  (TRIUMEQ ) 600-50-300 MG tablet Take 1 tablet by mouth daily. Patient not taking: Reported on 01/17/2024 08/13/23   Pearlean Manus, MD  albuterol  (VENTOLIN  HFA) 108 (90 Base) MCG/ACT inhaler Inhale 2 puffs into the lungs every 6 (six) hours as needed for wheezing or shortness of breath. Patient not taking: Reported on 10/20/2023 08/13/23   Pearlean Manus, MD  Cholecalciferol  (VITAMIN D3) 125 MCG (5000 UT) CAPS Take 5,000 Units by mouth every Tuesday. Patient not taking: Reported on 01/17/2024    [provider]  cyclobenzaprine  (FLEXERIL ) 10 MG tablet Take 10 mg by mouth 2 (two) times daily as needed for muscle spasms. Patient not taking: Reported on 01/17/2024 12/22/22   [provider]  Deferasirox  (JADENU ) 360 MG TABS Take 2 tablets (720 mg total) by mouth daily. Patient not taking: Reported on 01/04/2024 11/05/23   [name], [name], [name]  diphenhydrAMINE  (BENADRYL ) 25 MG tablet Take 1 tablet (25 mg total) by mouth every 4 (four) hours as needed for itching. Patient not  taking: Reported on 01/17/2024 08/13/23   Pearlean Manus, MD  folic acid  (FOLVITE ) 400 MCG tablet Take 1 tablet (400 mcg total) by mouth daily. Patient not taking: Reported on 01/17/2024 08/13/23   Pearlean Manus, MD  gabapentin  (NEURONTIN ) 300 MG capsule Take 1 capsule (300 mg total) by mouth at bedtime as needed (nerve pain). Patient not taking: Reported on 01/04/2024 08/13/23   Pearlean Manus, MD  guaiFENesin  (MUCINEX ) 600 MG 12 hr tablet Take 1 tablet (600 mg total) by mouth 2 (two) times daily. Patient not taking: Reported on 01/17/2024 08/13/23 08/12/24  Pearlean Manus, MD  hydroxyurea  (HYDREA ) 500 MG capsule Take 4 capsules (2,000 mg total) by mouth at bedtime. Patient not taking: Reported on 01/17/2024 08/13/23   Pearlean Manus, MD  NARCAN  4 MG/0.1ML LIQD nasal spray kit Place 1 spray into the nose as needed (respiratory distress / opioid overdose). Patient not taking: Reported on 01/17/2024 01/07/23   [provider]  oxyCODONE -acetaminophen  (PERCOCET) 10-325 MG tablet Take 1 tablet by mouth every 4 (four) hours as needed for up to 16 days for pain. 01/15/24 01/31/24  [name], [name], [name]  pantoprazole  (PROTONIX ) 40 MG tablet Take 1 tablet (40 mg total) by mouth daily as needed (acid reflux). Patient not taking: Reported on 01/04/2024 08/13/23   Pearlean Manus, MD  RIVAROXABAN TAD) VTE STARTER PACK (15 & 20 MG) Follow package directions: Take one 15mg  tablet by mouth twice a day. On day 22, switch to one 20mg  tablet once a day. Take with food. 01/21/24  Cherylene [name] HERO, NP  senna-docusate (SENOKOT-S) 8.6-50 MG tablet Take 2 tablets by mouth at bedtime. Patient not taking: Reported on 01/11/2024 08/13/23   Pearlean Manus, MD  triamcinolone  cream (KENALOG ) 0.1 % Apply 1 Application topically 2 (two) times daily. Patient not taking: Reported on 01/04/2024 01/12/23   Tilford Bertram HERO, [name]  Vitamin D , Ergocalciferol , (DRISDOL ) 1.25 MG (50000 UNIT) CAPS capsule Take 1 capsule  (50,000 Units total) by mouth every 7 (seven) days. Patient not taking: Reported on 01/17/2024 11/05/23   [name], [name], [name]  Vitamin D , Ergocalciferol , (DRISDOL ) 1.25 MG (50000 UNIT) CAPS capsule Take 1 capsule (50,000 Units total) by mouth every 7 (seven) days. Patient not taking: Reported on 01/04/2024 11/09/23   [name], [name], [name]    Allergies: Ketorolac  tromethamine , Tape, and Wound dressing adhesive    Review of Systems  Constitutional:  Negative for fever.  Respiratory:  Negative for shortness of breath, wheezing and stridor.   Cardiovascular:  Negative for chest pain.  All other systems reviewed and are negative.   Updated Vital Signs BP 101/75 (BP Location: Left Arm)   Pulse 98   Temp 97.7 F (36.5 C) (Oral)   Resp 18   Ht 6' 3 (1.905 m)   Wt 63.5 kg   SpO2 96%   BMI 17.50 kg/m   Physical Exam Vitals and nursing note reviewed.  Constitutional:      General: He is not in acute distress.    Appearance: He is well-developed. He is not diaphoretic.  HENT:     Head: Normocephalic and atraumatic.     Nose: Nose normal.  Eyes:     Conjunctiva/sclera: Conjunctivae normal.     Pupils: Pupils are equal, round, and reactive to light.  Cardiovascular:     Rate and Rhythm: Normal rate and regular rhythm.     Pulses: Normal pulses.     Heart sounds: Normal heart sounds.  Pulmonary:     Effort: Pulmonary effort is normal.     Breath sounds: Normal breath sounds. No wheezing or rales.  Abdominal:     General: Bowel sounds are normal.     Palpations: Abdomen is soft.     Tenderness: There is no abdominal tenderness. There is no guarding or rebound.  Musculoskeletal:        General: Normal range of motion.     Cervical back: Normal range of motion and neck supple.  Skin:    General: Skin is warm and dry.     Capillary Refill: Capillary refill takes less than 2 seconds.  Neurological:     General: No focal deficit present.     Mental Status: He is alert and  oriented to person, place, and time.     Deep Tendon Reflexes: Reflexes normal.  Psychiatric:        Mood and Affect: Mood normal.     (all labs ordered are listed, but only abnormal results are displayed) Results for orders placed or performed during the hospital encounter of 01/31/24  Comprehensive metabolic panel   Collection Time: 01/31/24  4:01 AM  Result Value Ref Range   Sodium 141 135 - 145 mmol/L   Potassium 4.0 3.5 - 5.1 mmol/L   Chloride 108 98 - 111 mmol/L   CO2 22 22 - 32 mmol/L   Glucose, Bld 137 (H) 70 - 99 mg/dL   BUN 12 6 - 20 mg/dL   Creatinine, Ser 8.97 0.61 - 1.24 mg/dL   Calcium  8.9 8.9 -  10.3 mg/dL   Total Protein 7.7 6.5 - 8.1 g/dL   Albumin 4.2 3.5 - 5.0 g/dL   AST 38 15 - 41 U/L   ALT 14 0 - 44 U/L   Alkaline Phosphatase 57 38 - 126 U/L   Total Bilirubin 10.4 (H) 0.0 - 1.2 mg/dL   GFR, Estimated >39 >39 mL/min   Anion gap 11 5 - 15  Reticulocytes   Collection Time: 01/31/24  4:01 AM  Result Value Ref Range   Retic Ct Pct >30.0 (H) 0.4 - 3.1 %   RBC. 2.52 (L) 4.22 - 5.81 MIL/uL   Retic Count, Absolute Not Measured 19.0 - 186.0 K/uL   Immature Retic Fract Not Measured 2.3 - 15.9 %   DG Chest Portable 1 View Result Date: 01/16/2024 CLINICAL DATA:  33 year old male with sickle cell disease, hypoxia, small right lower lobe pulmonary embolus on CTA yesterday. Query porta calf placement. EXAM: PORTABLE CHEST 1 VIEW COMPARISON:  CTA chest yesterday. Chest radiographs 0048 hours yesterday. And earlier. FINDINGS: Portable AP semi upright view at 0446 hours. Stable right chest power port, with looped catheter tubing at the thoracic inlet which appears to be within the lumen of the vein at the jugular and subclavian venous confluence when judging from the CTA chest yesterday. This Port-A-Cath has been in place with stable configuration since it was seemingly revised in May of 2023 (see radiographs 09/01/2021, 08/12/2021). Cardiomegaly, mediastinal contours appear  stable from yesterday. Larger lung volumes now. No pneumothorax or pleural effusion. Ventilation does appear improved at the right lung base from radiographs yesterday. Otherwise Coarse bilateral pulmonary interstitial opacity is stable. IMPRESSION: 1. Chronic right chest Port-A-Cath with stable configuration since May of 2023 (looped catheter tubing at the thoracic inlet appears to be within the lumen of the jugular and subclavian venous confluence on CT/CTA). 2. Larger lung volumes with improved ventilation at the right lung base from yesterday. Otherwise stable coarse bilateral pulmonary interstitial opacity, Cardiomegaly. Electronically Signed   By: VEAR Hurst M.D.   On: 01/16/2024 05:10   CT Angio Chest PE W and/or Wo Contrast Addendum Date: 01/15/2024 ADDENDUM REPORT: 01/15/2024 04:48 ADDENDUM: Study discussed by telephone with Dr. Trine in the ED on 01/15/2024 at 0419 hours. Electronically Signed   By: VEAR Hurst M.D.   On: 01/15/2024 04:48   Result Date: 01/15/2024 CLINICAL DATA:  33 year old male with hypoxia.  Sickle cell disease. EXAM: CT ANGIOGRAPHY CHEST WITH CONTRAST TECHNIQUE: Multidetector CT imaging of the chest was performed using the standard protocol during bolus administration of intravenous contrast. Multiplanar CT image reconstructions and MIPs were obtained to evaluate the vascular anatomy. RADIATION DOSE REDUCTION: This exam was performed according to the departmental dose-optimization program which includes automated exposure control, adjustment of the mA and/or kV according to patient size and/or use of iterative reconstruction technique. CONTRAST:  75mL OMNIPAQUE  IOHEXOL  350 MG/ML SOLN COMPARISON:  Chest radiographs 0048 hours today. CTA chest 01/04/2024 earlier. FINDINGS: Cardiovascular: Good contrast bolus timing in the pulmonary arterial tree. No central or lobar pulmonary artery filling defect. Right lower lobe segmental or subsegmental low-density pulmonary artery filling defect on  series 10, image 253 is consistent with pulmonary artery thrombus. But no other pulmonary artery filling defect is identified. Stable mild to moderate cardiomegaly. No pericardial effusion. Negative visible aorta. Right chest Port-A-Cath remains in place. Mediastinum/Nodes: Stable reactive appearing mediastinal and hilar lymph nodes. Lungs/Pleura: Improved lung volumes compared to 01/04/2024. Major airways remain patent, other than trace retained secretions along  the anterior wall of the trachea at the thoracic inlet on the right widespread pulmonary mosaic attenuation with superimposed areas of overt pulmonary scarring and architectural distortion including the lung apices, both lung bases. No pleural effusion. No consolidation. Nor is worsening ventilation compared to 01/04/2024. Upper Abdomen: Small calcified splenic remnant redemonstrated. Negative visible liver, pancreas, adrenal glands, kidneys, and fluid containing stomach in the upper abdomen. Musculoskeletal: Course bone mineralization throughout the visible skeleton. Maintained thoracic vertebral height and endplates. No acute or suspicious osseous lesion. Review of the MIP images confirms the above findings. IMPRESSION: 1. Positive for a small right lower lobe segmental or subsegmental pulmonary embolus. No other pulmonary artery filling defect identified. 2. Sequelae of Sickle Cell Disease including cardiomegaly, splenic atrophy, skeletal changes. Widespread pulmonary mosaic attenuation with scattered scarring also most likely chronic small vessel disease related. No areas of acute opacity or worsening ventilation compared to the CTA 01/04/2024. No pleural effusion. Stable reactive appearing mediastinal lymph nodes. Electronically Signed: By: VEAR Hurst M.D. On: 01/15/2024 04:15   DG Chest 2 View Result Date: 01/15/2024 CLINICAL DATA:  Low O2. EXAM: CHEST - 2 VIEW COMPARISON:  January 03, 2024 FINDINGS: There is stable right-sided venous Port-A-Cath  positioning. The heart size and mediastinal contours are within normal limits. Chronic appearing increased interstitial lung markings are seen. Mild, hazy superimposed opacities are seen involving the mid to lower lung fields, bilaterally. This is minimally increased in severity when compared to the prior study. No pleural effusion or pneumothorax is identified. No acute osseous abnormalities are identified. IMPRESSION: Chronic appearing increased interstitial lung markings with additional findings that may represent a superimposed atypical infectious or inflammatory process involving the mid to lower lung fields, bilaterally. Electronically Signed   By: Suzen Dials M.D.   On: 01/15/2024 01:23   CT Angio Chest PE W and/or Wo Contrast Result Date: 01/04/2024 CLINICAL DATA:  Sickle cell pain. Chest pain. Clinical concern for pulmonary embolus. EXAM: CT ANGIOGRAPHY CHEST WITH CONTRAST TECHNIQUE: Multidetector CT imaging of the chest was performed using the standard protocol during bolus administration of intravenous contrast. Multiplanar CT image reconstructions and MIPs were obtained to evaluate the vascular anatomy. RADIATION DOSE REDUCTION: This exam was performed according to the departmental dose-optimization program which includes automated exposure control, adjustment of the mA and/or kV according to patient size and/or use of iterative reconstruction technique. CONTRAST:  75mL OMNIPAQUE  IOHEXOL  350 MG/ML SOLN COMPARISON:  10/15/2023 FINDINGS: Cardiovascular: Heart size upper normal. No substantial pericardial effusion. Right Port-A-Cath tip is positioned in the distal SVC near the RA junction. There is no filling defect within the opacified pulmonary arteries to suggest the presence of an acute pulmonary embolus. Enlargement of the pulmonary outflow tract/main pulmonary arteries suggests pulmonary arterial hypertension. Mediastinum/Nodes: Scattered small mediastinal and hilar lymph nodes evident. The  esophagus has normal imaging features. Small axillary lymph nodes evident bilaterally. Lungs/Pleura: Interval progression of patchy ground-glass opacity in both lungs with scattered areas of architectural distortion and atelectasis. Interlobular septal thickening noted towards the lung bases. 4 mm left upper lobe nodule on 45/12 is new in the interval. Upper Abdomen: Auto splenectomy. Musculoskeletal: No worrisome lytic or sclerotic osseous abnormality. Review of the MIP images confirms the above findings. IMPRESSION: 1. No CT evidence for acute pulmonary embolus. 2. Interval progression of patchy ground-glass opacity in both lungs with scattered areas of architectural distortion and atelectasis. Findings are basilar predominant with associated interlobular septal thickening. Imaging features are nonspecific but can be seen in the  setting of acute chest syndrome. Pulmonary edema or atypical infection would also be considerations. 3. Enlargement of the pulmonary outflow tract/main pulmonary arteries suggests pulmonary arterial hypertension. 4. 4 mm left upper lobe pulmonary nodule, new in the interval. No follow-up needed if patient is low-risk.This recommendation follows the consensus statement: Guidelines for Management of Incidental Pulmonary Nodules Detected on CT Images: From the Fleischner Society 2017; Radiology 2017; 284:228-243. Electronically Signed   By: Camellia Candle M.D.   On: 01/04/2024 08:14   DG Chest 2 View Result Date: 01/03/2024 CLINICAL DATA:  hypoxia EXAM: CHEST - 2 VIEW COMPARISON:  Chest x-ray 12/15/2023 FINDINGS: Right chest wall Port-A-Cath with tip overlying the distal superior vena cava/region of the superior caval junction. The heart and mediastinal contours are within normal limits. No focal consolidation. Chronic coarsened interstitial markings. No overt pulmonary edema. No pleural effusion. No pneumothorax. No acute osseous abnormality. IMPRESSION: No acute cardiopulmonary disease in  a patient with underlying chronic coarsened interstitial markings. Electronically Signed   By: Morgane  Naveau M.D.   On: 01/03/2024 01:48     Radiology: No results found.   Procedures   Medications Ordered in the ED  dextrose  5 % and 0.45 % NaCl infusion ( Intravenous New Bag/Given 01/31/24 0355)  HYDROmorphone  (DILAUDID ) injection 2 mg (has no administration in time range)  HYDROmorphone  (DILAUDID ) injection 2 mg (has no administration in time range)  diphenhydrAMINE  (BENADRYL ) capsule 25 mg (has no administration in time range)  HYDROmorphone  (DILAUDID ) injection 2 mg (2 mg Intravenous Given 01/31/24 0352)                                    Medical Decision Making Recurrent sickle cell pain   Amount and/or Complexity of Data Reviewed External Data Reviewed: labs and notes.    Details: See my previous note from 2 nights ago  Labs: ordered.    Details: Normal sodium 141, normal potassium 4, normal creatinine.  Reticulocyte count percent markedly elevated > 30.  White count slight elevation 12.4, slightly low hemoglobin 8.9, sickle cells seen on smear   Risk Prescription drug management. Risk Details: Resting comfortably in bed.  Protocol ordered prior to seeing patient.  3 doses given and patient declines admission to the hospital.  Patient is anticipating discharge with clinic follow up.      Final diagnoses:  None   No signs of systemic illness or infection. The patient is nontoxic-appearing on exam and vital signs are within normal limits.  I have reviewed the triage vital signs and the nursing notes. Pertinent labs & imaging results that were available during my care of the patient were reviewed by me and considered in my medical decision making (see chart for details). After history, exam, and medical workup I feel the patient has been appropriately medically screened and is safe for discharge home. Pertinent diagnoses were discussed with the patient. Patient was given  return precautions.    Jonia Oakey, MD 01/31/24 [phone]

## 2024-01-31 NOTE — ED Notes (Signed)
 Patient d/c with home care instructions. Port de accessed.

## 2024-02-01 ENCOUNTER — Other Ambulatory Visit: Payer: Self-pay | Admitting: Nurse Practitioner

## 2024-02-01 ENCOUNTER — Telehealth: Payer: Self-pay

## 2024-02-01 ENCOUNTER — Telehealth (HOSPITAL_COMMUNITY): Payer: Self-pay | Admitting: *Deleted

## 2024-02-01 ENCOUNTER — Encounter (HOSPITAL_COMMUNITY): Payer: Self-pay | Admitting: Internal Medicine

## 2024-02-01 ENCOUNTER — Non-Acute Institutional Stay (HOSPITAL_COMMUNITY)
Admission: AD | Admit: 2024-02-01 | Discharge: 2024-02-01 | Disposition: A | Discharge status: Home / Self Care | Payer: Self-pay | Source: Ambulatory Visit | Attending: Internal Medicine | Admitting: Internal Medicine

## 2024-02-01 ENCOUNTER — Other Ambulatory Visit (HOSPITAL_COMMUNITY): Payer: Self-pay

## 2024-02-01 DIAGNOSIS — Z7901 Long term (current) use of anticoagulants: Secondary | ICD-10-CM | POA: Diagnosis not present

## 2024-02-01 DIAGNOSIS — J9611 Chronic respiratory failure with hypoxia: Secondary | ICD-10-CM | POA: Diagnosis not present

## 2024-02-01 DIAGNOSIS — Z832 Family history of diseases of the blood and blood-forming organs and certain disorders involving the immune mechanism: Secondary | ICD-10-CM | POA: Diagnosis not present

## 2024-02-01 DIAGNOSIS — G8929 Other chronic pain: Secondary | ICD-10-CM | POA: Diagnosis not present

## 2024-02-01 DIAGNOSIS — Z86711 Personal history of pulmonary embolism: Secondary | ICD-10-CM | POA: Diagnosis not present

## 2024-02-01 DIAGNOSIS — D571 Sickle-cell disease without crisis: Secondary | ICD-10-CM

## 2024-02-01 DIAGNOSIS — D5700 Hb-SS disease with crisis, unspecified: Secondary | ICD-10-CM | POA: Insufficient documentation

## 2024-02-01 DIAGNOSIS — F1190 Opioid use, unspecified, uncomplicated: Secondary | ICD-10-CM

## 2024-02-01 DIAGNOSIS — Z21 Asymptomatic human immunodeficiency virus [HIV] infection status: Secondary | ICD-10-CM | POA: Insufficient documentation

## 2024-02-01 DIAGNOSIS — Z9981 Dependence on supplemental oxygen: Secondary | ICD-10-CM | POA: Diagnosis not present

## 2024-02-01 MED ORDER — POLYETHYLENE GLYCOL 3350 17 G PO PACK: 17.0000 g | PACK | Freq: Every day | ORAL | Status: DC | PRN

## 2024-02-01 MED ORDER — SODIUM CHLORIDE 0.9% FLUSH
10.0000 mL | INTRAVENOUS | Status: AC | PRN
  Administered 2024-02-01: 10 mL

## 2024-02-01 MED ORDER — OXYCODONE-ACETAMINOPHEN 10-325 MG PO TABS
1.0000 | ORAL_TABLET | ORAL | 0 refills | Status: DC | PRN
Start: 2024-02-01 — End: 2024-02-18

## 2024-02-01 MED ORDER — NALOXONE HCL 0.4 MG/ML IJ SOLN: 0.4000 mg | INTRAMUSCULAR | Status: DC | PRN

## 2024-02-01 MED ORDER — HYDROMORPHONE 1 MG/ML IV SOLN
INTRAVENOUS | Status: DC
  Administered 2024-02-01: 30 mg via INTRAVENOUS
  Administered 2024-02-01: 12.5 mg via INTRAVENOUS
  Filled 2024-02-01: qty 30

## 2024-02-01 MED ORDER — ONDANSETRON HCL 4 MG/2ML IJ SOLN: 4.0000 mg | Freq: Four times a day (QID) | INTRAMUSCULAR | Status: DC | PRN

## 2024-02-01 MED ORDER — HEPARIN SOD (PORK) LOCK FLUSH 100 UNIT/ML IV SOLN
500.0000 [IU] | INTRAVENOUS | Status: AC | PRN
  Administered 2024-02-01: 500 [IU]
  Filled 2024-02-01: qty 5

## 2024-02-01 MED ORDER — DIPHENHYDRAMINE HCL 25 MG PO CAPS
25.0000 mg | ORAL_CAPSULE | ORAL | Status: DC | PRN
  Filled 2024-02-01: qty 1

## 2024-02-01 MED ORDER — SODIUM CHLORIDE 0.45 % IV SOLN: INTRAVENOUS | Status: DC

## 2024-02-01 MED ORDER — SODIUM CHLORIDE 0.9% FLUSH: 9.0000 mL | INTRAVENOUS | Status: DC | PRN

## 2024-02-01 MED ORDER — SENNOSIDES-DOCUSATE SODIUM 8.6-50 MG PO TABS
1.0000 | ORAL_TABLET | Freq: Two times a day (BID) | ORAL | Status: DC
  Filled 2024-02-01: qty 1

## 2024-02-01 NOTE — Telephone Encounter (Signed)
 Sent to provider. Kh

## 2024-02-01 NOTE — H&P (Signed)
 [facility] History and Physical  Martin [name] FMW:[id number] DOB: 05-15-1990 DOA: 02/01/2024  PCP: [name], [name], [name]   Chief Complaint:  Chief Complaint  Patient presents with   Sickle Cell Pain Crisis    HPI: Martin [name] is a 33 y.o. 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  Last took his home pain medication this morning with no resolution to pain symptoms.   Systemic Review: General: The patient denies anorexia, fever, weight loss Cardiac: Denies chest pain, syncope, palpitations, pedal edema  Respiratory: Denies cough, shortness of breath, wheezing GI: Denies severe indigestion/heartburn, abdominal pain, nausea, vomiting, diarrhea and constipation GU: Denies hematuria, incontinence, dysuria  Musculoskeletal: Denies arthritis  Skin: Denies suspicious skin lesions Neurologic: Denies focal weakness or numbness, change in vision  Past Medical History:  Diagnosis Date   Anxiety    HIV (human immunodeficiency virus infection) (HCC)    Proteinuria    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HCC)    Vitamin D  deficiency 10/2018    Past Surgical History:  Procedure Laterality Date   IR IMAGING GUIDED PORT INSERTION  08/29/2019   IR REMOVAL TUN ACCESS W/ PORT W/O FL MOD SED  08/30/2020   TEE WITHOUT CARDIOVERSION N/A 08/30/2020   Procedure: TRANSESOPHAGEAL ECHOCARDIOGRAM (TEE);  Surgeon: Mona Vinie BROCKS, MD;  Location: [facility] ENDOSCOPY;  Service: Cardiovascular;  Laterality: N/A;    Allergies  Allergen Reactions   Ketorolac  Tromethamine  Swelling and Other (See Comments)    Patient reports  facial edema and left arm edema after administration.   Tape Rash and Other (See Comments)    PLEASE DO NOT USE THE CLEAR, THICK, PLASTIC TAPE- only paper tape is tolerated    Wound Dressing Adhesive Rash    Family History  Problem Relation Age of Onset   Sickle cell trait Mother    Sickle cell trait Father    Birth defects Maternal Grandmother    Birth defects Paternal Grandmother       Prior to Admission medications   Medication Sig Start Date End Date Taking? Authorizing Provider  abacavir -dolutegravir -lamiVUDine  (TRIUMEQ ) 600-50-300 MG tablet Take 1 tablet by mouth daily. 08/13/23  Yes Emokpae, Courage, MD  apixaban  (ELIQUIS ) 5 MG TABS tablet Take 1 tablet (5 mg total) by mouth 2 (two) times daily. 08/13/23  Yes Pearlean Manus, MD  cyclobenzaprine  (FLEXERIL ) 10 MG tablet Take 10 mg by mouth 2 (two) times daily as needed for muscle spasms. 12/22/22  Yes [provider]  diphenhydrAMINE  (BENADRYL ) 25 MG tablet Take 1 tablet (25 mg total) by mouth every 4 (four) hours as needed for itching. 08/13/23  Yes Emokpae, Courage, MD  folic acid  (FOLVITE ) 400 MCG tablet Take 1 tablet (400 mcg total) by mouth daily. 08/13/23  Yes Emokpae, Courage, MD  gabapentin  (NEURONTIN ) 300 MG capsule Take 1 capsule (300 mg total) by mouth at bedtime as needed (nerve pain). 08/13/23  Yes Emokpae, Courage, MD  hydroxyurea  (HYDREA ) 500 MG capsule Take 4 capsules (2,000 mg total) by mouth at bedtime. 08/13/23  Yes Emokpae, Courage, MD  oxyCODONE -acetaminophen  (PERCOCET) 10-325 MG tablet Take 1 tablet by mouth every 4 (four) hours as needed for pain. 12/03/23  Yes [name], [name], [name]  Vitamin D , Ergocalciferol , (DRISDOL ) 1.25 MG (50000 UNIT) CAPS capsule Take 1 capsule (50,000 Units total) by mouth every 7 (seven) days. 11/05/23  Yes [name], [name], [name]  albuterol  (VENTOLIN  HFA) 108 (90 Base) MCG/ACT inhaler Inhale 2 puffs into the lungs every 6 (six) hours as needed for wheezing or shortness of  breath. Patient not taking: Reported on 10/20/2023 08/13/23   Pearlean Manus, MD  Deferasirox  (JADENU ) 360 MG TABS Take 2 tablets (720 mg total) by mouth daily. 11/05/23   [name], [name], [name]  guaiFENesin  (MUCINEX ) 600 MG 12 hr tablet Take 1 tablet (600 mg total) by mouth 2 (two) times daily. Patient not taking: Reported on 10/20/2023 08/13/23 08/12/24  Pearlean Manus, MD  NARCAN  4 MG/0.1ML LIQD nasal spray kit Place 1 spray into the nose as needed (respiratory distress / opioid overdose). 01/07/23   [provider]  pantoprazole  (PROTONIX ) 40 MG tablet Take 1 tablet (40 mg total) by mouth daily as needed (acid reflux). Patient not taking: Reported on 10/20/2023 08/13/23   Pearlean Manus, MD  senna-docusate (SENOKOT-S) 8.6-50 MG tablet Take 2 tablets by mouth at bedtime. Patient not taking: Reported on 10/20/2023 08/13/23   Pearlean Manus, MD  triamcinolone  cream (KENALOG ) 0.1 % Apply 1 Application topically 2 (two) times daily. Patient not taking: Reported on 10/27/2023 01/12/23   Tilford Bertram HERO, [name]  Vitamin D , Ergocalciferol , (DRISDOL ) 1.25 MG (50000 UNIT) CAPS capsule Take 1 capsule (50,000 Units total) by mouth every 7 (seven) days. 11/09/23   [name], [name], [name]     Physical Exam: Vitals:   02/01/24 0957 02/01/24 1135 02/01/24 1250 02/01/24 1435  BP: 102/62  101/61 109/76  Pulse: 87  78 81  Resp: 16 16 17 15   Temp: 98.6 F (37 C)   (!) 97.5 F (36.4 C)  TempSrc: Temporal   Temporal  SpO2: 93%  90% 91%    General: Alert, awake, afebrile, anicteric, not in obvious distress HEENT: Normocephalic and Atraumatic, Mucous membranes pink                PERRLA; EOM intact; No scleral icterus,                 Nares: Patent, Oropharynx: Clear, Fair Dentition                 Neck: FROM, no cervical lymphadenopathy, thyromegaly, carotid bruit or JVD;  CHEST WALL: No tenderness  CHEST: Normal respiration, clear to auscultation bilaterally  HEART: Regular rate and rhythm; no  murmurs rubs or gallops  BACK: No kyphosis or scoliosis; no CVA tenderness  ABDOMEN: Positive Bowel Sounds, soft, non-tender; no masses, no organomegaly EXTREMITIES: No cyanosis, clubbing, or edema SKIN:  no rash or ulceration  CNS: Alert and Oriented x 4, Nonfocal exam, CN 2-12 intact  Labs on Admission:  Basic Metabolic Panel: Recent Labs  Lab 01/28/24 0435 01/31/24 0401  NA 142 141  K 4.0 4.0  CL 110 108  CO2 21* 22  GLUCOSE 86 137*  BUN 14 12  CREATININE 0.89 1.02  CALCIUM  8.7* 8.9   Liver Function Tests: Recent Labs  Lab 01/28/24 0435 01/31/24 0401  AST 38 38  ALT 12 14  ALKPHOS 59 57  BILITOT 7.0* 10.4*  PROT 7.6 7.7  ALBUMIN 4.0 4.2   No results for input(s): LIPASE, AMYLASE in the last 168 hours. No results for input(s): AMMONIA in the last 168 hours. CBC: Recent Labs  Lab 01/28/24  0435 01/31/24 0401  WBC 12.6* 12.4*  NEUTROABS 5.8 4.6  HGB 8.9* 8.9*  HCT 25.4* 25.1*  MCV 108.1* 101.2*  PLT 354 312   Cardiac Enzymes: No results for input(s): CKTOTAL, CKMB, CKMBINDEX, TROPONINI in the last 168 hours.  BNP (last 3 results) No results for input(s): BNP in the last 8760 hours.  ProBNP (last 3 results) Recent Labs    01/16/24 1816  PROBNP 2,143.0*    CBG: No results for input(s): GLUCAP in the last 168 hours.   Assessment/Plan Active Problems:   Sickle cell anemia with pain (HCC)  Admits to the Day Hospital for extended observation IVF 0 .45% Saline @ 125 mls/hour Weight based Dilaudid  PCA started within 30 minutes of admission Acetaminophen  1000 mg x 1 dose Labs: CBCD, CMP, Retic Count and LDH (completed in the ED yesterday)  Monitor vitals very closely, Re-evaluate pain scale every hour 2 L of Oxygen  by [place] Patient will be re-evaluated for pain in the context of function and relationship to baseline as care progresses. If no significant relieve from pain (remains above 5/10) will transfer patient to inpatient services  for further evaluation and management  Code Status: Full  Family Communication: None  DVT Prophylaxis: Ambulate as tolerated   Time spent: 35 Minutes  [name] Cover NP   If 7PM-7AM, please contact night-coverage [email or web address] 02/01/2024, 4:03 PM

## 2024-02-01 NOTE — Telephone Encounter (Signed)
 Please advise. Pt is requesting oxy refill. Med has dropped off the list  [facility]

## 2024-02-01 NOTE — Telephone Encounter (Signed)
 Patient called expressing need to come to day hospital for sickle cell pain/treatment today 9/10 pain all over, states took Percocet 10 mg at 6 am.  Deneis any other symptoms during phone triage.  Patient has own transportation without him driving himself to day hospital.  Patient advised we would take him for care at day hospital today and release back home after treatment plan followed.  Patient agreeable and understanding of acceptance .

## 2024-02-01 NOTE — Telephone Encounter (Signed)
 Med will be filled when pt leaves the day hospital per [facility]. [facility]

## 2024-02-01 NOTE — Progress Notes (Signed)
 Reviewed PDMP substance reporting system prior to prescribing opiate medications. No inconsistencies noted.    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  Dispense: 90 tablet; Refill: 0  2. Chronic, continuous use of opioids  - oxyCODONE -acetaminophen  (PERCOCET) 10-325 MG tablet; Take 1 tablet by mouth every 4 (four) hours as needed for pain.  Dispense: 90 tablet; Refill: 0

## 2024-02-01 NOTE — Telephone Encounter (Signed)
 Copied from CRM #[id number]. Topic: Clinical - Prescription Issue >> Feb 01, 2024  2:32 PM Avram MATSU wrote: Reason for CRM: patient is calling to refill his oxyconde but does not see the medication in his chart [phone] (M) please advise.   ----------------------------------------------------------------------- From previous Reason for Contact - Medication Question: Reason for CRM:

## 2024-02-01 NOTE — Progress Notes (Signed)
 Patient admitted to the day hospital for sickle cell pain. Initially, patient reported generalized pain rated 9/10. For pain management, patient placed on Sickle Cell Dose Dilaudid  PCA and hydrated with IV fluids. At discharge, patient rated pain at 5/10. Vital signs stable. AVS offered but patient refused. Patient alert, oriented and ambulatory at discharge.

## 2024-02-01 NOTE — Discharge Summary (Cosign Needed Addendum)
 Physician Discharge Summary  [name] FMW:[id number] DOB: 13-Sep-1990 DOA: 02/01/2024  PCP: [name], [name], [name]  Admit date: 02/01/2024  Discharge date: 02/01/2024  Time spent: 30 minutes  Discharge Diagnoses:  Active Problems:   Sickle cell anemia with pain [facility])   Discharge Condition: Stable  Diet recommendation: Regular  History of present illness:   Martin [name] is a 32 y.o. 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  Last took his home pain medication this morning with no resolution to pain symptoms.     Hospital Course:  Martin [name] was admitted to the day hospital with sickle cell painful crisis. Patient was treated with IV fluid, weight based IV Dilaudid  PCA, and other adjunct therapies per sickle cell pain management protocol. Martin [name] showed improvement symptomatically, pain improved from 9/10 to 6/10 at the time of discharge. Patient was discharged home in a hemodynamically stable condition. Martin [name] will follow-up at the clinic as previously scheduled, continue with home medications as per prior to admission.  Discharge Instructions We discussed the need for good hydration, monitoring of hydration status, avoidance of heat, cold, stress, and infection triggers. We discussed the need to be compliant with taking his home medications. Martin [name] was reminded of the need to seek medical attention immediately if any symptom of bleeding, anemia, or infection occurs.  Discharge Exam: Vitals:   02/01/24 1250 02/01/24 1435  BP: 101/61 109/76  Pulse: 78 81  Resp: 17 15  Temp:   (!) 97.5 F (36.4 C)  SpO2: 90% 91%    General appearance: alert, cooperative and no distress Eyes: conjunctivae/corneas clear. PERRL, EOM's intact. Fundi benign. Neck: no adenopathy, no carotid bruit, no JVD, supple, symmetrical, trachea midline and thyroid not enlarged, symmetric, no tenderness/mass/nodules Back: symmetric, no curvature. ROM normal. No CVA tenderness. Resp: clear to auscultation bilaterally Chest wall: no tenderness Cardio: regular rate and rhythm, S1, S2 normal, no murmur, click, rub or gallop GI: soft, non-tender; bowel sounds normal; no masses,  no organomegaly Extremities: extremities normal, atraumatic, no cyanosis or edema Pulses: 2+ and symmetric Skin: Skin color, texture, turgor normal. No rashes or lesions Neurologic: Grossly normal  Discharge Instructions     Call MD for:  persistant nausea and vomiting   Complete by: As directed    Call MD for:  severe uncontrolled pain   Complete by: As directed    Call MD for:  temperature >100.4   Complete by: As directed    Diet - low sodium heart healthy   Complete by: As directed    Increase activity slowly   Complete by: As directed       Allergies as of 02/01/2024       Reactions   Ketorolac  Tromethamine  Swelling, Other (See Comments)   Patient reports facial edema and left arm edema after administration.   Tape Rash, Other (See Comments)   PLEASE DO NOT USE THE CLEAR, THICK, PLASTIC TAPE- only paper tape is tolerated    Wound Dressing Adhesive Rash        Medication List     TAKE these medications    albuterol  108 (90 Base) MCG/ACT inhaler Commonly known as: VENTOLIN   HFA Inhale 2 puffs into the lungs every 6 (six) hours as needed for wheezing or shortness of breath.   cyclobenzaprine  10 MG tablet Commonly known as: FLEXERIL  Take 10 mg by mouth 2 (two) times daily as needed for muscle spasms.   Deferasirox  360 MG Tabs Commonly known as: Jadenu  Take 2 tablets (720 mg total) by mouth  daily.   diphenhydrAMINE  25 MG tablet Commonly known as: BENADRYL  Take 1 tablet (25 mg total) by mouth every 4 (four) hours as needed for itching.   folic acid  400 MCG tablet Commonly known as: FOLVITE  Take 1 tablet (400 mcg total) by mouth daily.   gabapentin  300 MG capsule Commonly known as: NEURONTIN  Take 1 capsule (300 mg total) by mouth at bedtime as needed (nerve pain).   guaiFENesin  600 MG 12 hr tablet Commonly known as: Mucinex  Take 1 tablet (600 mg total) by mouth 2 (two) times daily.   hydroxyurea  500 MG capsule Commonly known as: HYDREA  Take 4 capsules (2,000 mg total) by mouth at bedtime.   Narcan  4 MG/0.1ML Liqd nasal spray kit Generic drug: naloxone  Place 1 spray into the nose as needed (respiratory distress / opioid overdose).   pantoprazole  40 MG tablet Commonly known as: PROTONIX  Take 1 tablet (40 mg total) by mouth daily as needed (acid reflux).   Rivaroxaban Starter Pack (15 mg and 20 mg) Commonly known as: XARELTO STARTER PACK Follow package directions: Take one 15mg  tablet by mouth twice a day. On day 22, switch to one 20mg  tablet once a day. Take with food.   senna-docusate 8.6-50 MG tablet Commonly known as: Senokot-S Take 2 tablets by mouth at bedtime.   triamcinolone  cream 0.1 % Commonly known as: KENALOG  Apply 1 Application topically 2 (two) times daily.   Triumeq  600-50-300 MG tablet Generic drug: abacavir -dolutegravir -lamiVUDine  Take 1 tablet by mouth daily.   Vitamin D  (Ergocalciferol ) 1.25 MG (50000 UNIT) Caps capsule Commonly known as: DRISDOL  Take 1 capsule (50,000 Units total) by mouth every 7 (seven) days.   Vitamin D  (Ergocalciferol ) 1.25 MG (50000 UNIT) Caps capsule Commonly known as: DRISDOL  Take 1 capsule (50,000 Units total) by mouth every 7 (seven) days.   Vitamin D3 125 MCG (5000 UT) Caps Take 5,000 Units by mouth every Tuesday.       Allergies  Allergen Reactions   Ketorolac  Tromethamine  Swelling and Other (See  Comments)    Patient reports facial edema and left arm edema after administration.   Tape Rash and Other (See Comments)    PLEASE DO NOT USE THE CLEAR, THICK, PLASTIC TAPE- only paper tape is tolerated    Wound Dressing Adhesive Rash     Significant Diagnostic Studies: DG Chest Portable 1 View Result Date: 01/16/2024 CLINICAL DATA:  33 year old male with sickle cell disease, hypoxia, small right lower lobe pulmonary embolus on CTA yesterday. Query porta calf placement. EXAM: PORTABLE CHEST 1 VIEW COMPARISON:  CTA chest yesterday. Chest radiographs 0048 hours yesterday. And earlier. FINDINGS: Portable AP semi upright view at 0446 hours. Stable right chest [name] port, with looped catheter tubing at the thoracic inlet which appears to be within the lumen of the vein at the jugular and subclavian venous confluence when judging from the CTA chest yesterday. This Port-A-Cath has been in place with stable configuration since it was seemingly revised in May of 2023 (see radiographs 09/01/2021, 08/12/2021). Cardiomegaly, mediastinal contours appear stable from yesterday. Larger lung volumes now. No pneumothorax or pleural effusion. Ventilation does appear improved at the right lung base  from radiographs yesterday. Otherwise Coarse bilateral pulmonary interstitial opacity is stable. IMPRESSION: 1. Chronic right chest Port-A-Cath with stable configuration since May of 2023 (looped catheter tubing at the thoracic inlet appears to be within the lumen of the jugular and subclavian venous confluence on CT/CTA). 2. Larger lung volumes with improved ventilation at the right lung base from yesterday. Otherwise stable coarse bilateral pulmonary interstitial opacity, Cardiomegaly. Electronically Signed   By: VEAR Hurst M.D.   On: 01/16/2024 05:10   CT Angio Chest PE W and/or Wo Contrast Addendum Date: 01/15/2024 ADDENDUM REPORT: 01/15/2024 04:48 ADDENDUM: Study discussed by telephone with Dr. Trine in the ED on 01/15/2024  at 0419 hours. Electronically Signed   By: VEAR Hurst M.D.   On: 01/15/2024 04:48   Result Date: 01/15/2024 CLINICAL DATA:  33 year old male with hypoxia.  Sickle cell disease. EXAM: CT ANGIOGRAPHY CHEST WITH CONTRAST TECHNIQUE: Multidetector CT imaging of the chest was performed using the standard protocol during bolus administration of intravenous contrast. Multiplanar CT image reconstructions and MIPs were obtained to evaluate the vascular anatomy. RADIATION DOSE REDUCTION: This exam was performed according to the departmental dose-optimization program which includes automated exposure control, adjustment of the mA and/or kV according to patient size and/or use of iterative reconstruction technique. CONTRAST:  75mL OMNIPAQUE  IOHEXOL  350 MG/ML SOLN COMPARISON:  Chest radiographs 0048 hours today. CTA chest 01/04/2024 earlier. FINDINGS: Cardiovascular: Good contrast bolus timing in the pulmonary arterial tree. No central or lobar pulmonary artery filling defect. Right lower lobe segmental or subsegmental low-density pulmonary artery filling defect on series 10, image 253 is consistent with pulmonary artery thrombus. But no other pulmonary artery filling defect is identified. Stable mild to moderate cardiomegaly. No pericardial effusion. Negative visible aorta. Right chest Port-A-Cath remains in place. Mediastinum/Nodes: Stable reactive appearing mediastinal and hilar lymph nodes. Lungs/Pleura: Improved lung volumes compared to 01/04/2024. Major airways remain patent, other than trace retained secretions along the anterior wall of the trachea at the thoracic inlet on the right widespread pulmonary mosaic attenuation with superimposed areas of overt pulmonary scarring and architectural distortion including the lung apices, both lung bases. No pleural effusion. No consolidation. Nor is worsening ventilation compared to 01/04/2024. Upper Abdomen: Small calcified splenic remnant redemonstrated. Negative visible liver,  pancreas, adrenal glands, kidneys, and fluid containing stomach in the upper abdomen. Musculoskeletal: Course bone mineralization throughout the visible skeleton. Maintained thoracic vertebral height and endplates. No acute or suspicious osseous lesion. Review of the MIP images confirms the above findings. IMPRESSION: 1. Positive for a small right lower lobe segmental or subsegmental pulmonary embolus. No other pulmonary artery filling defect identified. 2. Sequelae of Sickle Cell Disease including cardiomegaly, splenic atrophy, skeletal changes. Widespread pulmonary mosaic attenuation with scattered scarring also most likely chronic small vessel disease related. No areas of acute opacity or worsening ventilation compared to the CTA 01/04/2024. No pleural effusion. Stable reactive appearing mediastinal lymph nodes. Electronically Signed: By: VEAR Hurst M.D. On: 01/15/2024 04:15   DG Chest 2 View Result Date: 01/15/2024 CLINICAL DATA:  Low O2. EXAM: CHEST - 2 VIEW COMPARISON:  January 03, 2024 FINDINGS: There is stable right-sided venous Port-A-Cath positioning. The heart size and mediastinal contours are within normal limits. Chronic appearing increased interstitial lung markings are seen. Mild, hazy superimposed opacities are seen involving the mid to lower lung fields, bilaterally. This is minimally increased in severity when compared to the prior study. No pleural effusion or pneumothorax is identified. No acute osseous abnormalities are identified. IMPRESSION: Chronic appearing  increased interstitial lung markings with additional findings that may represent a superimposed atypical infectious or inflammatory process involving the mid to lower lung fields, bilaterally. Electronically Signed   By: Suzen Dials M.D.   On: 01/15/2024 01:23   CT Angio Chest PE W and/or Wo Contrast Result Date: 01/04/2024 CLINICAL DATA:  Sickle cell pain. Chest pain. Clinical concern for pulmonary embolus. EXAM: CT  ANGIOGRAPHY CHEST WITH CONTRAST TECHNIQUE: Multidetector CT imaging of the chest was performed using the standard protocol during bolus administration of intravenous contrast. Multiplanar CT image reconstructions and MIPs were obtained to evaluate the vascular anatomy. RADIATION DOSE REDUCTION: This exam was performed according to the departmental dose-optimization program which includes automated exposure control, adjustment of the mA and/or kV according to patient size and/or use of iterative reconstruction technique. CONTRAST:  75mL OMNIPAQUE  IOHEXOL  350 MG/ML SOLN COMPARISON:  10/15/2023 FINDINGS: Cardiovascular: Heart size upper normal. No substantial pericardial effusion. Right Port-A-Cath tip is positioned in the distal SVC near the RA junction. There is no filling defect within the opacified pulmonary arteries to suggest the presence of an acute pulmonary embolus. Enlargement of the pulmonary outflow tract/main pulmonary arteries suggests pulmonary arterial hypertension. Mediastinum/Nodes: Scattered small mediastinal and hilar lymph nodes evident. The esophagus has normal imaging features. Small axillary lymph nodes evident bilaterally. Lungs/Pleura: Interval progression of patchy ground-glass opacity in both lungs with scattered areas of architectural distortion and atelectasis. Interlobular septal thickening noted towards the lung bases. 4 mm left upper lobe nodule on 45/12 is new in the interval. Upper Abdomen: Auto splenectomy. Musculoskeletal: No worrisome lytic or sclerotic osseous abnormality. Review of the MIP images confirms the above findings. IMPRESSION: 1. No CT evidence for acute pulmonary embolus. 2. Interval progression of patchy ground-glass opacity in both lungs with scattered areas of architectural distortion and atelectasis. Findings are basilar predominant with associated interlobular septal thickening. Imaging features are nonspecific but can be seen in the setting of acute chest  syndrome. Pulmonary edema or atypical infection would also be considerations. 3. Enlargement of the pulmonary outflow tract/main pulmonary arteries suggests pulmonary arterial hypertension. 4. 4 mm left upper lobe pulmonary nodule, new in the interval. No follow-up needed if patient is low-risk.This recommendation follows the consensus statement: Guidelines for Management of Incidental Pulmonary Nodules Detected on CT Images: From the Fleischner Society 2017; Radiology 2017; 284:228-243. Electronically Signed   By: Camellia Candle M.D.   On: 01/04/2024 08:14   DG Chest 2 View Result Date: 01/03/2024 CLINICAL DATA:  hypoxia EXAM: CHEST - 2 VIEW COMPARISON:  Chest x-ray 12/15/2023 FINDINGS: Right chest wall Port-A-Cath with tip overlying the distal superior vena cava/region of the superior caval junction. The heart and mediastinal contours are within normal limits. No focal consolidation. Chronic coarsened interstitial markings. No overt pulmonary edema. No pleural effusion. No pneumothorax. No acute osseous abnormality. IMPRESSION: No acute cardiopulmonary disease in a patient with underlying chronic coarsened interstitial markings. Electronically Signed   By: Morgane  Naveau M.D.   On: 01/03/2024 01:48    Signed:  [name] Cover NP   02/01/2024, 4:04 PM

## 2024-02-03 ENCOUNTER — Other Ambulatory Visit (HOSPITAL_COMMUNITY): Payer: Self-pay

## 2024-02-04 ENCOUNTER — Encounter: Payer: Self-pay | Admitting: Nurse Practitioner

## 2024-02-04 ENCOUNTER — Ambulatory Visit: Payer: Self-pay | Admitting: Nurse Practitioner

## 2024-02-04 VITALS — BP 106/70 | HR 104 | Wt 140.0 lb

## 2024-02-04 DIAGNOSIS — R7989 Other specified abnormal findings of blood chemistry: Secondary | ICD-10-CM

## 2024-02-04 DIAGNOSIS — D571 Sickle-cell disease without crisis: Secondary | ICD-10-CM

## 2024-02-04 DIAGNOSIS — R630 Anorexia: Secondary | ICD-10-CM

## 2024-02-04 DIAGNOSIS — Z86711 Personal history of pulmonary embolism: Secondary | ICD-10-CM

## 2024-02-04 DIAGNOSIS — M62838 Other muscle spasm: Secondary | ICD-10-CM

## 2024-02-04 DIAGNOSIS — Z21 Asymptomatic human immunodeficiency virus [HIV] infection status: Secondary | ICD-10-CM

## 2024-02-04 DIAGNOSIS — R079 Chest pain, unspecified: Secondary | ICD-10-CM

## 2024-02-04 DIAGNOSIS — N3001 Acute cystitis with hematuria: Secondary | ICD-10-CM

## 2024-02-04 DIAGNOSIS — R10A1 Flank pain, right side: Secondary | ICD-10-CM | POA: Insufficient documentation

## 2024-02-04 DIAGNOSIS — E559 Vitamin D deficiency, unspecified: Secondary | ICD-10-CM

## 2024-02-04 DIAGNOSIS — F1290 Cannabis use, unspecified, uncomplicated: Secondary | ICD-10-CM

## 2024-02-04 LAB — POCT URINALYSIS DIP (CLINITEK)
Glucose, UA: NEGATIVE mg/dL
Ketones, POC UA: NEGATIVE mg/dL
Leukocytes, UA: NEGATIVE
Nitrite, UA: POSITIVE — AB
POC PROTEIN,UA: >=300 — AB
Spec Grav, UA: 1.02 (ref 1.010–1.025)
Urobilinogen, UA: 2 U/dL — AB
pH, UA: 5.5 (ref 5.0–8.0)

## 2024-02-04 MED ORDER — SULFAMETHOXAZOLE-TRIMETHOPRIM 800-160 MG PO TABS: 1.0000 | ORAL_TABLET | Freq: Two times a day (BID) | ORAL | 0 refills | Status: DC

## 2024-02-04 MED ORDER — CYCLOBENZAPRINE HCL 5 MG PO TABS: 5.0000 mg | ORAL_TABLET | Freq: Two times a day (BID) | ORAL | 1 refills | Status: AC | PRN

## 2024-02-04 NOTE — Progress Notes (Addendum)
 Established Patient Office Visit  Subjective:  Patient ID: Martin [name], male    DOB: Aug 04, 1990  Age: 33 y.o. MRN: [id number]  CC:  Chief Complaint  Patient presents with   Sickle Cell Anemia    HPI  Discussed the use of AI scribe software for clinical note transcription with the patient, who gave verbal consent to proceed.  History of Present Illness Martin [name] is a 33 year old male with sickle cell disease and a history of pulmonary embolism who presents for a routine three-month checkup.  He has been experiencing right-sided abdominal pain for the past two nights, which is alleviated by pressure. There are no previous episodes of similar pain. He denies nausea, vomiting, constipation, or urinary symptoms such as burning or blood in the urine. However, he notes unusually dark urine despite increased water intake.  He experiences intermittent left-sided chest pain that began this week. He reports that the chest pain comes and goes, and that when he breathes all the way in, he feels a 'pop' and then the pain goes away. He denies cough or wheezing. He has a history of chest pain related to sickle cell disease and a recent pulmonary embolism diagnosed in September 2025. He is currently on Xarelto, having switched from Eliquis  due to dosing issues.  He reports fatigue, which he attributes to his history of blood clots. No fever or chills.  His current medications include hydroxyurea  2000 mg daily, Xarelto 20 mg daily, albuterol  inhaler as needed, vitamin D  5000 units weekly, Flexeril  10 mg as needed, and gabapentin  300 mg at bedtime as needed. He also takes folic acid , though he is unsure of the exact dosage, and Percocet 10/325 mg as needed, last taken at 10 AM today.  He has a history of iron overload with a ferritin level of 2585 ng/mL in July 2025, but he is not currently taking medication for this condition. He also reports low vitamin D  levels.  He uses marijuana for  appetite stimulation and has been on Flexeril  for muscle spasms related to sickle cell disease for several years.    Assessment & Plan       Past Medical History:  Diagnosis Date   Anxiety    Chest pain 02/04/2024   HIV (human immunodeficiency virus infection) (HCC)    Proteinuria    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HCC)    Vitamin D  deficiency 10/2018    Past Surgical History:  Procedure Laterality Date   IR IMAGING GUIDED PORT INSERTION  08/29/2019   IR REMOVAL TUN ACCESS W/ PORT W/O FL MOD SED  08/30/2020   TEE WITHOUT CARDIOVERSION N/A 08/30/2020   Procedure: TRANSESOPHAGEAL ECHOCARDIOGRAM (TEE);  Surgeon: Mona Vinie BROCKS, MD;  Location: [facility] ENDOSCOPY;  Service: Cardiovascular;  Laterality: N/A;    Family History  Problem Relation Age of Onset   Sickle cell trait Mother    Sickle cell trait Father    Birth defects Maternal Grandmother    Birth defects Paternal Grandmother     Social History   Socioeconomic History   Marital status: Single    Spouse name: Not on file   Number of children: Not on file   Years of education: Not on file   Highest education level: 12th grade  Occupational History   Occupation: disabled  Tobacco Use   Smoking status: Never   Smokeless tobacco: Never  Vaping Use   Vaping status: Never Used  Substance and Sexual Activity   Alcohol  use: No  Drug use: Not Currently    Types: Marijuana    Comment: 2x week   Sexual activity: Yes    Birth control/protection: Condom  Other Topics Concern   Not on file  Social History Narrative   Lives with his siblings.    Social Drivers of Corporate investment banker Strain: Low Risk  (10/09/2023)   Received from [facility]   Overall Financial Resource Strain (CARDIA)    Difficulty of Paying Living Expenses: Not very hard  Recent Concern: Financial Resource Strain - Medium Risk (08/03/2023)   Overall Financial Resource Strain (CARDIA)    Difficulty of Paying Living Expenses: Somewhat hard  Food  Insecurity: No Food Insecurity (01/11/2024)   Hunger Vital Sign    Worried About Running Out of Food in the Last Year: Never true    Ran Out of Food in the Last Year: Never true  Transportation Needs: No Transportation Needs (01/11/2024)   PRAPARE - Administrator, Civil Service (Medical): No    Lack of Transportation (Non-Medical): No  Physical Activity: Sufficiently Active (10/09/2023)   Received from [facility]   Exercise Vital Sign    On average, how many days per week do you engage in moderate to strenuous exercise (like a brisk walk)?: 7 days    On average, how many minutes do you engage in exercise at this level?: 60 min  Stress: No Stress Concern Present (10/09/2023)   Received from [facility] of Occupational Health - Occupational Stress Questionnaire    Feeling of Stress : Not at all  Social Connections: Socially Isolated (01/04/2024)   Social Connection and Isolation Panel    Frequency of Communication with Friends and Family: More than three times a week    Frequency of Social Gatherings with Friends and Family: Twice a week    Attends Religious Services: Never    Database administrator or Organizations: No    Attends Banker Meetings: Never    Marital Status: Never married  Intimate Partner Violence: Not At Risk (01/11/2024)   Humiliation, Afraid, Rape, and Kick questionnaire    Fear of Current or Ex-Partner: No    Emotionally Abused: No    Physically Abused: No    Sexually Abused: No    Outpatient Medications Prior to Visit  Medication Sig Dispense Refill   abacavir -dolutegravir -lamiVUDine  (TRIUMEQ ) 600-50-300 MG tablet Take 1 tablet by mouth daily. 30 tablet 3   diphenhydrAMINE  (BENADRYL ) 25 MG tablet Take 1 tablet (25 mg total) by mouth every 4 (four) hours as needed for itching. 30 tablet 1   gabapentin  (NEURONTIN ) 300 MG capsule Take 1 capsule (300 mg total) by mouth at bedtime as needed (nerve pain). 30 capsule 3    hydroxyurea  (HYDREA ) 500 MG capsule Take 4 capsules (2,000 mg total) by mouth at bedtime. 120 capsule 11   NARCAN  4 MG/0.1ML LIQD nasal spray kit Place 1 spray into the nose as needed (respiratory distress / opioid overdose).     oxyCODONE -acetaminophen  (PERCOCET) 10-325 MG tablet Take 1 tablet by mouth every 4 (four) hours as needed for pain. 90 tablet 0   RIVAROXABAN (XARELTO) VTE STARTER PACK (15 & 20 MG) Follow package directions: Take one 15mg  tablet by mouth twice a day. On day 22, switch to one 20mg  tablet once a day. Take with food. 51 each 0   cyclobenzaprine  (FLEXERIL ) 10 MG tablet Take 10 mg by mouth 2 (two) times daily as needed for muscle  spasms.     albuterol  (VENTOLIN  HFA) 108 (90 Base) MCG/ACT inhaler Inhale 2 puffs into the lungs every 6 (six) hours as needed for wheezing or shortness of breath. (Patient not taking: Reported on 02/04/2024) 6.7 g 2   Cholecalciferol  (VITAMIN D3) 125 MCG (5000 UT) CAPS Take 5,000 Units by mouth every Tuesday.     Deferasirox  (JADENU ) 360 MG TABS Take 2 tablets (720 mg total) by mouth daily. (Patient not taking: Reported on 02/04/2024) 60 tablet 3   folic acid  (FOLVITE ) 400 MCG tablet Take 1 tablet (400 mcg total) by mouth daily. 30 tablet 5   guaiFENesin  (MUCINEX ) 600 MG 12 hr tablet Take 1 tablet (600 mg total) by mouth 2 (two) times daily. (Patient not taking: Reported on 02/04/2024) 60 tablet 2   pantoprazole  (PROTONIX ) 40 MG tablet Take 1 tablet (40 mg total) by mouth daily as needed (acid reflux). (Patient not taking: Reported on 01/04/2024) 30 tablet 2   senna-docusate (SENOKOT-S) 8.6-50 MG tablet Take 2 tablets by mouth at bedtime. (Patient not taking: Reported on 02/04/2024) 60 tablet 3   triamcinolone  cream (KENALOG ) 0.1 % Apply 1 Application topically 2 (two) times daily. (Patient not taking: Reported on 02/04/2024) 30 g 2   Vitamin D , Ergocalciferol , (DRISDOL ) 1.25 MG (50000 UNIT) CAPS capsule Take 1 capsule (50,000 Units total) by mouth  every 7 (seven) days. (Patient not taking: Reported on 02/04/2024) 12 capsule 1   Vitamin D , Ergocalciferol , (DRISDOL ) 1.25 MG (50000 UNIT) CAPS capsule Take 1 capsule (50,000 Units total) by mouth every 7 (seven) days. (Patient not taking: Reported on 02/04/2024) 12 capsule 2   No facility-administered medications prior to visit.    Allergies  Allergen Reactions   Ketorolac  Tromethamine  Swelling and Other (See Comments)    Patient reports facial edema and left arm edema after administration.   Tape Rash and Other (See Comments)    PLEASE DO NOT USE THE CLEAR, THICK, PLASTIC TAPE- only paper tape is tolerated    Wound Dressing Adhesive Rash    ROS Review of Systems  Constitutional:  Positive for fatigue. Negative for appetite change, chills and fever.  HENT:  Negative for congestion, postnasal drip, rhinorrhea and sneezing.   Respiratory:  Negative for cough, shortness of breath and wheezing.   Cardiovascular:  Positive for chest pain. Negative for palpitations and leg swelling.  Gastrointestinal:  Negative for abdominal pain, constipation, nausea and vomiting.  Genitourinary:  Positive for flank pain. Negative for difficulty urinating, dysuria and frequency.  Musculoskeletal:  Negative for arthralgias, back pain, joint swelling and myalgias.  Skin:  Negative for color change, pallor, rash and wound.  Neurological:  Negative for facial asymmetry, weakness, numbness and headaches.  Psychiatric/Behavioral:  Negative for behavioral problems, confusion, self-injury and suicidal ideas.       Objective:    Physical Exam Vitals and nursing note reviewed.  Constitutional:      General: He is not in acute distress.    Appearance: Normal appearance. He is not ill-appearing, toxic-appearing or diaphoretic.  Eyes:     General: No scleral icterus.       Right eye: No discharge.        Left eye: No discharge.     Extraocular Movements: Extraocular movements intact.      Conjunctiva/sclera: Conjunctivae normal.  Cardiovascular:     Rate and Rhythm: Normal rate and regular rhythm.     Pulses: Normal pulses.     Heart sounds: Normal heart sounds. No murmur heard.  No friction rub. No gallop.  Pulmonary:     Effort: Pulmonary effort is normal. No respiratory distress.     Breath sounds: Normal breath sounds. No stridor. No wheezing, rhonchi or rales.  Chest:     Chest wall: No tenderness.  Abdominal:     General: There is no distension.     Palpations: Abdomen is soft.     Tenderness: There is no abdominal tenderness. There is right CVA tenderness. There is no left CVA tenderness or guarding.  Musculoskeletal:        General: No swelling, tenderness, deformity or signs of injury.     Right lower leg: No edema.     Left lower leg: No edema.  Skin:    General: Skin is warm and dry.     Capillary Refill: Capillary refill takes less than 2 seconds.     Coloration: Skin is not jaundiced or pale.     Findings: No bruising, erythema or lesion.  Neurological:     Mental Status: He is alert and oriented to person, place, and time.     Motor: No weakness.     Coordination: Coordination normal.     Gait: Gait normal.  Psychiatric:        Mood and Affect: Mood normal.        Behavior: Behavior normal.        Thought Content: Thought content normal.        Judgment: Judgment normal.     BP 106/70   Pulse (!) 104   Wt 140 lb (63.5 kg)   SpO2 93%   BMI 17.50 kg/m  Wt Readings from Last 3 Encounters:  02/04/24 140 lb (63.5 kg)  01/31/24 140 lb (63.5 kg)  01/16/24 140 lb 3.4 oz (63.6 kg)    No results found for: TSH Lab Results  Component Value Date   WBC 12.4 (H) 01/31/2024   HGB 8.9 (L) 01/31/2024   HCT 25.1 (L) 01/31/2024   MCV 101.2 (H) 01/31/2024   PLT 312 01/31/2024   Lab Results  Component Value Date   NA 141 01/31/2024   K 4.0 01/31/2024   CO2 22 01/31/2024   GLUCOSE 137 (H) 01/31/2024   BUN 12 01/31/2024   CREATININE 1.02  01/31/2024   BILITOT 10.4 (H) 01/31/2024   ALKPHOS 57 01/31/2024   AST 38 01/31/2024   ALT 14 01/31/2024   PROT 7.7 01/31/2024   ALBUMIN 4.2 01/31/2024   CALCIUM  8.9 01/31/2024   ANIONGAP 11 01/31/2024   EGFR 120 01/12/2023   No results found for: CHOL No results found for: HDL No results found for: LDLCALC No results found for: [name] No results found for: CHOLHDL Lab Results  Component Value Date   HGBA1C (L) 05/22/2009    <4.0 (NOTE) The ADA recommends the following therapeutic goal for glycemic control related to Hgb A1c measurement: Goal of therapy: <6.5 Hgb A1c  Reference: American Diabetes Association: Clinical Practice Recommendations 2010, Diabetes Care, 2010, 33: (Suppl  1).      Assessment & Plan:   Problem List Items Addressed This Visit       Genitourinary   Acute cystitis with hematuria   Bactrim DS 1 tablet twice daily for 3 days ordered Urine sent to the lab for culture      Relevant Medications   sulfamethoxazole-trimethoprim (BACTRIM DS) 800-160 MG tablet   Other Relevant Orders   Urine Culture   POCT URINALYSIS DIP (CLINITEK) (Completed)     Other  HIV (human immunodeficiency virus infection) (HCC)   Continue Triumeq  1 tablet daily Maintain close follow-up with infectious disease specialist      Relevant Medications   sulfamethoxazole-trimethoprim (BACTRIM DS) 800-160 MG tablet   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 We discussed the need to be adherent with taking Hydrea  and other home medications. Patient was reminded of the need to seek medical attention immediately if any symptom of bleeding, anemia, or infection occurs.        Relevant Orders   ToxAssure Flex 15, Ur   Vitamin D  deficiency   Taking OTC vitamin D  supplement, unsure of the dosage Advised to take 1000 units daily since his  insurance does not cover vitamin D  50,000 units prescription Last vitamin D  Lab Results  Component Value Date   VD25OH 9.7 (L) 11/03/2023         Relevant Orders   VITAMIN D  25 Hydroxy (Vit-D Deficiency, Fractures)   History of pulmonary embolism   Pulmonary embolism on anticoagulation On Xarelto 20 mg daily after switching from Eliquis . Compliance emphasized to prevent recurrence. Financial concerns addressed with discount card and insurance pursuit. - Ensure compliance with Xarelto. - Refer to clinical pharmacy for medication assistance. - Encourage seeking insurance coverage.        Relevant Orders   AMB Referral VBCI Care Management   Marijuana use   Cessation encouraged      Iron overload, transfusional   Lab Results  Component Value Date   FERRITIN 2,585 (H) 11/03/2023  Rechecking labs, has Tylenol  ordered but not taking the medication      Relevant Orders   Ferritin   Elevated brain natriuretic peptide (BNP) level   Pro Brain Natriuretic Peptide 2,143.0 High   Currently denies shortness of breath no edema noted on examination today Patient referred to cardiology      Relevant Orders   Ambulatory referral to Cardiology   Chest pain   Left-sided chest pain Intermittent pain possibly related to recent pulmonary embolism. Improves with deep breathing. - Monitor chest pain and ensure compliance with anticoagulation therapy. Patient referred to cardiology      Relevant Orders   Ambulatory referral to Cardiology   Muscle spasm   Risk of excessive sedation taking Flexeril  and Percocet together discussed Advised to report excessive sedation      Relevant Medications   cyclobenzaprine  (FLEXERIL ) 5 MG tablet   Decrease in appetite   Encouraged to take Ensure nutritional supplement between meals       Meds ordered this encounter  Medications   cyclobenzaprine  (FLEXERIL ) 5 MG tablet    Sig: Take 1 tablet (5 mg total) by mouth 2 (two) times daily as needed  for muscle spasms.    Dispense:  30 tablet    Refill:  1   sulfamethoxazole-trimethoprim (BACTRIM DS) 800-160 MG tablet    Sig: Take 1 tablet by mouth 2 (two) times daily.    Dispense:  6 tablet    Refill:  0    Follow-up: Return in about 3 months (around 05/06/2024) for CPE.    Lexia Vandevender R Ishitha Roper, [name]

## 2024-02-04 NOTE — Assessment & Plan Note (Signed)
 Cessation encouraged

## 2024-02-04 NOTE — Assessment & Plan Note (Addendum)
 Pulmonary embolism on anticoagulation On Xarelto 20 mg daily after switching from Eliquis . Compliance emphasized to prevent recurrence. Financial concerns addressed with discount card and insurance pursuit. - Ensure compliance with Xarelto. - Refer to clinical pharmacy for medication assistance. - Encourage seeking insurance coverage.

## 2024-02-04 NOTE — Assessment & Plan Note (Signed)
 We discussed the need for good hydration, monitoring of hydration status, avoidance of heat, cold, stress, and infection triggers. We discussed the need to be adherent with taking Hydrea  and other home medications. Patient was reminded of the need to seek medical attention immediately if any symptom of bleeding, anemia, or infection occurs.

## 2024-02-04 NOTE — Assessment & Plan Note (Signed)
 Risk of excessive sedation taking Flexeril  and Percocet together discussed Advised to report excessive sedation

## 2024-02-04 NOTE — Assessment & Plan Note (Signed)
 Pro Brain Natriuretic Peptide 2,143.0 High   Currently denies shortness of breath no edema noted on examination today Patient referred to cardiology

## 2024-02-04 NOTE — Assessment & Plan Note (Signed)
 Bactrim DS 1 tablet twice daily for 3 days ordered Urine sent to the lab for culture

## 2024-02-04 NOTE — Assessment & Plan Note (Signed)
 Left-sided chest pain Intermittent pain possibly related to recent pulmonary embolism. Improves with deep breathing. - Monitor chest pain and ensure compliance with anticoagulation therapy. Patient referred to cardiology

## 2024-02-04 NOTE — Assessment & Plan Note (Signed)
 Continue Triumeq  1 tablet daily Maintain close follow-up with infectious disease specialist

## 2024-02-04 NOTE — Assessment & Plan Note (Deleted)
 Left-sided chest pain Intermittent pain possibly related to recent pulmonary embolism. Improves with deep breathing. - Monitor chest pain and ensure compliance with anticoagulation therapy. Patient referred to cardiology

## 2024-02-04 NOTE — Patient Instructions (Signed)
 .   Muscle spasm  - cyclobenzaprine  (FLEXERIL ) 5 MG tablet; Take 1 tablet (5 mg total) by mouth 2 (two) times daily as needed for muscle spasms.  Dispense: 30 tablet; Refill: 1   Acute cystitis with hematuria  - sulfamethoxazole-trimethoprim (BACTRIM DS) 800-160 MG tablet; Take 1 tablet by mouth 2 (two) times daily.  Dispense: 6 tablet; Refill: 0   Chest pain, unspecified type  - Brain natriuretic peptide  . History of pulmonary embolism  - AMB Referral VBCI Care Management   It is important that you exercise regularly at least 30 minutes 5 times a week as tolerated  Think about what you will eat, plan ahead. Choose  clean, green, fresh or frozen over canned, processed or packaged foods which are more sugary, salty and fatty. 70 to 75% of food eaten should be vegetables and fruit. Three meals at set times with snacks allowed between meals, but they must be fruit or vegetables. Aim to eat over a 12 hour period , example 7 am to 7 pm, and STOP after  your last meal of the day. Drink water,generally about 64 ounces per day, no other drink is as healthy. Fruit juice is best enjoyed in a healthy way, by EATING the fruit.  Thanks for choosing [facility] we consider it a privelige to serve you.

## 2024-02-04 NOTE — Assessment & Plan Note (Signed)
 Encouraged to take Ensure nutritional supplement between meals

## 2024-02-04 NOTE — Assessment & Plan Note (Signed)
 Taking OTC vitamin D  supplement, unsure of the dosage Advised to take 1000 units daily since his insurance does not cover vitamin D  50,000 units prescription Last vitamin D  Lab Results  Component Value Date   VD25OH 9.7 (L) 11/03/2023

## 2024-02-04 NOTE — Assessment & Plan Note (Signed)
 Lab Results  Component Value Date   FERRITIN 2,585 (H) 11/03/2023  Rechecking labs, has Tylenol  ordered but not taking the medication

## 2024-02-06 LAB — URINE CULTURE

## 2024-02-07 ENCOUNTER — Ambulatory Visit: Payer: Self-pay | Admitting: Nurse Practitioner

## 2024-02-07 ENCOUNTER — Inpatient Hospital Stay (HOSPITAL_COMMUNITY)
Admission: EM | Admit: 2024-02-07 | Discharge: 2024-02-17 | DRG: 811 | Disposition: A | Discharge status: Home / Self Care | Payer: Self-pay | Attending: Internal Medicine | Admitting: Internal Medicine

## 2024-02-07 DIAGNOSIS — Z9981 Dependence on supplemental oxygen: Secondary | ICD-10-CM

## 2024-02-07 DIAGNOSIS — Z79899 Other long term (current) drug therapy: Secondary | ICD-10-CM

## 2024-02-07 DIAGNOSIS — G894 Chronic pain syndrome: Secondary | ICD-10-CM | POA: Diagnosis present

## 2024-02-07 DIAGNOSIS — Z59868 Other specified financial insecurity: Secondary | ICD-10-CM

## 2024-02-07 DIAGNOSIS — Z888 Allergy status to other drugs, medicaments and biological substances status: Secondary | ICD-10-CM

## 2024-02-07 DIAGNOSIS — J9611 Chronic respiratory failure with hypoxia: Secondary | ICD-10-CM | POA: Diagnosis present

## 2024-02-07 DIAGNOSIS — I2693 Single subsegmental pulmonary embolism without acute cor pulmonale: Secondary | ICD-10-CM | POA: Diagnosis present

## 2024-02-07 DIAGNOSIS — I2699 Other pulmonary embolism without acute cor pulmonale: Principal | ICD-10-CM | POA: Diagnosis present

## 2024-02-07 DIAGNOSIS — F411 Generalized anxiety disorder: Secondary | ICD-10-CM | POA: Diagnosis present

## 2024-02-07 DIAGNOSIS — Z21 Asymptomatic human immunodeficiency virus [HIV] infection status: Secondary | ICD-10-CM | POA: Diagnosis present

## 2024-02-07 DIAGNOSIS — Z7901 Long term (current) use of anticoagulants: Secondary | ICD-10-CM

## 2024-02-07 DIAGNOSIS — D5701 Hb-SS disease with acute chest syndrome: Secondary | ICD-10-CM

## 2024-02-07 DIAGNOSIS — R509 Fever, unspecified: Secondary | ICD-10-CM | POA: Diagnosis present

## 2024-02-07 DIAGNOSIS — N179 Acute kidney failure, unspecified: Secondary | ICD-10-CM | POA: Diagnosis present

## 2024-02-07 DIAGNOSIS — J9621 Acute and chronic respiratory failure with hypoxia: Secondary | ICD-10-CM | POA: Diagnosis present

## 2024-02-07 DIAGNOSIS — I2782 Chronic pulmonary embolism: Secondary | ICD-10-CM | POA: Diagnosis present

## 2024-02-07 DIAGNOSIS — R110 Nausea: Secondary | ICD-10-CM | POA: Diagnosis present

## 2024-02-07 DIAGNOSIS — Z91048 Other nonmedicinal substance allergy status: Secondary | ICD-10-CM

## 2024-02-07 DIAGNOSIS — T45516A Underdosing of anticoagulants, initial encounter: Secondary | ICD-10-CM | POA: Diagnosis present

## 2024-02-07 DIAGNOSIS — I272 Pulmonary hypertension, unspecified: Secondary | ICD-10-CM | POA: Diagnosis present

## 2024-02-07 DIAGNOSIS — D638 Anemia in other chronic diseases classified elsewhere: Secondary | ICD-10-CM | POA: Diagnosis present

## 2024-02-07 DIAGNOSIS — Z832 Family history of diseases of the blood and blood-forming organs and certain disorders involving the immune mechanism: Secondary | ICD-10-CM

## 2024-02-07 DIAGNOSIS — R000 Tachycardia, unspecified: Secondary | ICD-10-CM | POA: Diagnosis present

## 2024-02-07 DIAGNOSIS — Z604 Social exclusion and rejection: Secondary | ICD-10-CM | POA: Diagnosis present

## 2024-02-07 DIAGNOSIS — E8770 Fluid overload, unspecified: Secondary | ICD-10-CM | POA: Diagnosis present

## 2024-02-07 DIAGNOSIS — I081 Rheumatic disorders of both mitral and tricuspid valves: Secondary | ICD-10-CM | POA: Diagnosis present

## 2024-02-07 DIAGNOSIS — D72829 Elevated white blood cell count, unspecified: Secondary | ICD-10-CM | POA: Diagnosis present

## 2024-02-07 DIAGNOSIS — D5700 Hb-SS disease with crisis, unspecified: Principal | ICD-10-CM | POA: Diagnosis present

## 2024-02-07 DIAGNOSIS — Y92009 Unspecified place in unspecified non-institutional (private) residence as the place of occurrence of the external cause: Secondary | ICD-10-CM

## 2024-02-07 DIAGNOSIS — Z5971 Insufficient health insurance coverage: Secondary | ICD-10-CM

## 2024-02-07 DIAGNOSIS — I2510 Atherosclerotic heart disease of native coronary artery without angina pectoris: Secondary | ICD-10-CM | POA: Diagnosis present

## 2024-02-07 NOTE — ED Notes (Signed)
 Placed patient on 4L [place]. Patient is normally on this amount when he is in crisis. O2 is staying around 90-92 which patient states he stays in the low 90s normally.

## 2024-02-08 ENCOUNTER — Other Ambulatory Visit: Payer: Self-pay

## 2024-02-08 ENCOUNTER — Encounter (HOSPITAL_COMMUNITY): Payer: Self-pay

## 2024-02-08 ENCOUNTER — Emergency Department (HOSPITAL_COMMUNITY): Payer: Self-pay

## 2024-02-08 ENCOUNTER — Other Ambulatory Visit: Payer: Self-pay | Admitting: Nurse Practitioner

## 2024-02-08 ENCOUNTER — Other Ambulatory Visit (HOSPITAL_COMMUNITY): Payer: Self-pay

## 2024-02-08 DIAGNOSIS — G894 Chronic pain syndrome: Secondary | ICD-10-CM | POA: Diagnosis present

## 2024-02-08 DIAGNOSIS — R000 Tachycardia, unspecified: Secondary | ICD-10-CM | POA: Diagnosis present

## 2024-02-08 DIAGNOSIS — R509 Fever, unspecified: Secondary | ICD-10-CM | POA: Diagnosis present

## 2024-02-08 DIAGNOSIS — I2693 Single subsegmental pulmonary embolism without acute cor pulmonale: Secondary | ICD-10-CM | POA: Diagnosis present

## 2024-02-08 DIAGNOSIS — D5700 Hb-SS disease with crisis, unspecified: Secondary | ICD-10-CM | POA: Diagnosis present

## 2024-02-08 DIAGNOSIS — Z21 Asymptomatic human immunodeficiency virus [HIV] infection status: Secondary | ICD-10-CM | POA: Diagnosis present

## 2024-02-08 DIAGNOSIS — Z7901 Long term (current) use of anticoagulants: Secondary | ICD-10-CM | POA: Diagnosis not present

## 2024-02-08 DIAGNOSIS — Z91048 Other nonmedicinal substance allergy status: Secondary | ICD-10-CM | POA: Diagnosis not present

## 2024-02-08 DIAGNOSIS — I081 Rheumatic disorders of both mitral and tricuspid valves: Secondary | ICD-10-CM | POA: Diagnosis present

## 2024-02-08 DIAGNOSIS — D638 Anemia in other chronic diseases classified elsewhere: Secondary | ICD-10-CM | POA: Diagnosis present

## 2024-02-08 DIAGNOSIS — I2699 Other pulmonary embolism without acute cor pulmonale: Secondary | ICD-10-CM | POA: Diagnosis present

## 2024-02-08 DIAGNOSIS — T45516A Underdosing of anticoagulants, initial encounter: Secondary | ICD-10-CM | POA: Diagnosis present

## 2024-02-08 DIAGNOSIS — F411 Generalized anxiety disorder: Secondary | ICD-10-CM | POA: Diagnosis present

## 2024-02-08 DIAGNOSIS — Z9981 Dependence on supplemental oxygen: Secondary | ICD-10-CM | POA: Diagnosis not present

## 2024-02-08 DIAGNOSIS — Z59868 Other specified financial insecurity: Secondary | ICD-10-CM | POA: Diagnosis not present

## 2024-02-08 DIAGNOSIS — N179 Acute kidney failure, unspecified: Secondary | ICD-10-CM | POA: Diagnosis present

## 2024-02-08 DIAGNOSIS — R110 Nausea: Secondary | ICD-10-CM | POA: Diagnosis present

## 2024-02-08 DIAGNOSIS — D5701 Hb-SS disease with acute chest syndrome: Secondary | ICD-10-CM | POA: Diagnosis not present

## 2024-02-08 DIAGNOSIS — I361 Nonrheumatic tricuspid (valve) insufficiency: Secondary | ICD-10-CM | POA: Diagnosis not present

## 2024-02-08 DIAGNOSIS — Y92009 Unspecified place in unspecified non-institutional (private) residence as the place of occurrence of the external cause: Secondary | ICD-10-CM | POA: Diagnosis not present

## 2024-02-08 DIAGNOSIS — J9611 Chronic respiratory failure with hypoxia: Secondary | ICD-10-CM | POA: Diagnosis present

## 2024-02-08 DIAGNOSIS — Z832 Family history of diseases of the blood and blood-forming organs and certain disorders involving the immune mechanism: Secondary | ICD-10-CM | POA: Diagnosis not present

## 2024-02-08 DIAGNOSIS — D72829 Elevated white blood cell count, unspecified: Secondary | ICD-10-CM | POA: Diagnosis present

## 2024-02-08 DIAGNOSIS — I2609 Other pulmonary embolism with acute cor pulmonale: Secondary | ICD-10-CM | POA: Diagnosis not present

## 2024-02-08 DIAGNOSIS — I2782 Chronic pulmonary embolism: Secondary | ICD-10-CM | POA: Diagnosis present

## 2024-02-08 DIAGNOSIS — E8770 Fluid overload, unspecified: Secondary | ICD-10-CM | POA: Diagnosis present

## 2024-02-08 DIAGNOSIS — I272 Pulmonary hypertension, unspecified: Secondary | ICD-10-CM | POA: Diagnosis present

## 2024-02-08 DIAGNOSIS — I2510 Atherosclerotic heart disease of native coronary artery without angina pectoris: Secondary | ICD-10-CM | POA: Diagnosis present

## 2024-02-08 DIAGNOSIS — Z604 Social exclusion and rejection: Secondary | ICD-10-CM | POA: Diagnosis present

## 2024-02-08 LAB — TROPONIN T, HIGH SENSITIVITY
Troponin T High Sensitivity: 17 ng/L (ref 0–19)
Troponin T High Sensitivity: 16 ng/L (ref 0–19)

## 2024-02-08 LAB — TYPE AND SCREEN
ABO/RH(D): O POS
Antibody Screen: NEGATIVE

## 2024-02-08 LAB — CBC WITH DIFFERENTIAL/PLATELET
Abs Immature Granulocytes: 0.14 K/uL — ABNORMAL HIGH (ref 0.00–0.07)
Basophils Absolute: 0 K/uL (ref 0.0–0.1)
Basophils Relative: 0 %
Eosinophils Absolute: 0.1 K/uL (ref 0.0–0.5)
Eosinophils Relative: 1 %
HCT: 22 % — ABNORMAL LOW (ref 39.0–52.0)
Hemoglobin: 7.9 g/dL — ABNORMAL LOW (ref 13.0–17.0)
Immature Granulocytes: 1 %
Lymphocytes Relative: 49 %
Lymphs Abs: 5.7 K/uL — ABNORMAL HIGH (ref 0.7–4.0)
MCH: 36.7 pg — ABNORMAL HIGH (ref 26.0–34.0)
MCHC: 35.9 g/dL (ref 30.0–36.0)
MCV: 102.3 fL — ABNORMAL HIGH (ref 80.0–100.0)
Monocytes Absolute: 1.3 K/uL — ABNORMAL HIGH (ref 0.1–1.0)
Monocytes Relative: 11 %
Neutro Abs: 4.4 K/uL (ref 1.7–7.7)
Neutrophils Relative %: 38 %
Platelets: 259 K/uL (ref 150–400)
RBC: 2.15 MIL/uL — ABNORMAL LOW (ref 4.22–5.81)
RDW: 29.1 % — ABNORMAL HIGH (ref 11.5–15.5)
Smear Review: NORMAL
WBC: 11.6 K/uL — ABNORMAL HIGH (ref 4.0–10.5)
nRBC: 23.7 % — ABNORMAL HIGH (ref 0.0–0.2)

## 2024-02-08 LAB — COMPREHENSIVE METABOLIC PANEL WITH GFR
ALT: 22 U/L (ref 0–44)
AST: 59 U/L — ABNORMAL HIGH (ref 15–41)
Albumin: 4.1 g/dL (ref 3.5–5.0)
Alkaline Phosphatase: 61 U/L (ref 38–126)
Anion gap: 11 (ref 5–15)
BUN: 19 mg/dL (ref 6–20)
CO2: 22 mmol/L (ref 22–32)
Calcium: 8.6 mg/dL — ABNORMAL LOW (ref 8.9–10.3)
Chloride: 105 mmol/L (ref 98–111)
Creatinine, Ser: 1.06 mg/dL (ref 0.61–1.24)
GFR, Estimated: >60 mL/min (ref >60)
Glucose, Bld: 122 mg/dL — ABNORMAL HIGH (ref 70–99)
Potassium: 4.3 mmol/L (ref 3.5–5.1)
Sodium: 137 mmol/L (ref 135–145)
Total Bilirubin: 11.1 mg/dL — ABNORMAL HIGH (ref 0.0–1.2)
Total Protein: 7.9 g/dL (ref 6.5–8.1)

## 2024-02-08 LAB — URINALYSIS, ROUTINE W REFLEX MICROSCOPIC
Bacteria, UA: NONE SEEN
Bilirubin Urine: NEGATIVE
Glucose, UA: NEGATIVE mg/dL
Ketones, ur: NEGATIVE mg/dL
Nitrite: NEGATIVE
Protein, ur: 30 mg/dL — AB
Specific Gravity, Urine: 1.016 (ref 1.005–1.030)
pH: 5 (ref 5.0–8.0)

## 2024-02-08 LAB — CBC
HCT: 22.6 % — ABNORMAL LOW (ref 39.0–52.0)
Hemoglobin: 8.3 g/dL — ABNORMAL LOW (ref 13.0–17.0)
MCH: 36.7 pg — ABNORMAL HIGH (ref 26.0–34.0)
MCHC: 36.7 g/dL — ABNORMAL HIGH (ref 30.0–36.0)
MCV: 100 fL (ref 80.0–100.0)
Platelets: 267 K/uL (ref 150–400)
RBC: 2.26 MIL/uL — ABNORMAL LOW (ref 4.22–5.81)
RDW: 27.8 % — ABNORMAL HIGH (ref 11.5–15.5)
WBC: 9.3 K/uL (ref 4.0–10.5)
nRBC: 28.7 % — ABNORMAL HIGH (ref 0.0–0.2)

## 2024-02-08 LAB — RETICULOCYTES
Immature Retic Fract: 33.5 % — ABNORMAL HIGH (ref 2.3–15.9)
RBC.: 2.26 MIL/uL — ABNORMAL LOW (ref 4.22–5.81)
Retic Count, Absolute: 536.8 K/uL — ABNORMAL HIGH (ref 19.0–186.0)
Retic Ct Pct: 23.8 % — ABNORMAL HIGH (ref 0.4–3.1)

## 2024-02-08 LAB — PROTIME-INR
INR: 1.3 — ABNORMAL HIGH (ref 0.8–1.2)
Prothrombin Time: 17.1 s — ABNORMAL HIGH (ref 11.4–15.2)

## 2024-02-08 LAB — HEPARIN LEVEL (UNFRACTIONATED)
Heparin Unfractionated: <0.1 [IU]/mL — ABNORMAL LOW (ref 0.30–0.70)
Heparin Unfractionated: >1.1 [IU]/mL — ABNORMAL HIGH (ref 0.30–0.70)
Heparin Unfractionated: <0.1 [IU]/mL — ABNORMAL LOW (ref 0.30–0.70)
Heparin Unfractionated: 0.23 [IU]/mL — ABNORMAL LOW (ref 0.30–0.70)

## 2024-02-08 LAB — APTT: aPTT: 38 s — ABNORMAL HIGH (ref 24–36)

## 2024-02-08 LAB — PROCALCITONIN: Procalcitonin: 0.16 ng/mL

## 2024-02-08 LAB — MAGNESIUM: Magnesium: 2 mg/dL (ref 1.7–2.4)

## 2024-02-08 MED ORDER — RIVAROXABAN (XARELTO) VTE STARTER PACK (15 & 20 MG): ORAL_TABLET | ORAL | 0 refills | Status: DC

## 2024-02-08 MED ORDER — OXYCODONE-ACETAMINOPHEN 5-325 MG PO TABS
2.0000 | ORAL_TABLET | Freq: Once | ORAL | Status: AC
  Administered 2024-02-08: 2 via ORAL
  Filled 2024-02-08: qty 2

## 2024-02-08 MED ORDER — ABACAVIR-DOLUTEGRAVIR-LAMIVUD 600-50-300 MG PO TABS
1.0000 | ORAL_TABLET | Freq: Every day | ORAL | Status: DC
  Administered 2024-02-08 – 2024-02-17 (×10): 1 via ORAL
  Filled 2024-02-08 (×11): qty 1

## 2024-02-08 MED ORDER — HEPARIN (PORCINE) 25000 UT/250ML-% IV SOLN
1850.0000 [IU]/h | INTRAVENOUS | Status: AC
  Administered 2024-02-08: 1200 [IU]/h via INTRAVENOUS
  Administered 2024-02-09: 1850 [IU]/h via INTRAVENOUS
  Filled 2024-02-08 (×4): qty 250

## 2024-02-08 MED ORDER — GABAPENTIN 300 MG PO CAPS
300.0000 mg | ORAL_CAPSULE | Freq: Every evening | ORAL | Status: DC | PRN
  Filled 2024-02-08: qty 1

## 2024-02-08 MED ORDER — SENNOSIDES-DOCUSATE SODIUM 8.6-50 MG PO TABS
2.0000 | ORAL_TABLET | Freq: Every day | ORAL | Status: DC
  Administered 2024-02-08 – 2024-02-14 (×5): 2 via ORAL
  Filled 2024-02-08 (×8): qty 2

## 2024-02-08 MED ORDER — CYCLOBENZAPRINE HCL 5 MG PO TABS
5.0000 mg | ORAL_TABLET | Freq: Two times a day (BID) | ORAL | Status: DC | PRN
  Filled 2024-02-08: qty 1

## 2024-02-08 MED ORDER — FOLIC ACID 1 MG PO TABS
500.0000 ug | ORAL_TABLET | Freq: Every day | ORAL | Status: DC
  Administered 2024-02-08 – 2024-02-17 (×10): 0.5 mg via ORAL
  Filled 2024-02-08 (×10): qty 1

## 2024-02-08 MED ORDER — DIPHENHYDRAMINE HCL 25 MG PO CAPS
25.0000 mg | ORAL_CAPSULE | ORAL | Status: DC | PRN
  Administered 2024-02-12: 25 mg via ORAL
  Filled 2024-02-08: qty 1

## 2024-02-08 MED ORDER — OXYCODONE HCL 5 MG PO TABS
5.0000 mg | ORAL_TABLET | ORAL | Status: DC | PRN
Start: 2024-02-08 — End: 2024-02-17
  Administered 2024-02-09 – 2024-02-17 (×35): 5 mg via ORAL
  Filled 2024-02-08 (×35): qty 1

## 2024-02-08 MED ORDER — HEPARIN BOLUS VIA INFUSION
2000.0000 [IU] | Freq: Once | INTRAVENOUS | Status: AC
  Administered 2024-02-08: 2000 [IU] via INTRAVENOUS
  Filled 2024-02-08: qty 2000

## 2024-02-08 MED ORDER — OXYCODONE-ACETAMINOPHEN 5-325 MG PO TABS
1.0000 | ORAL_TABLET | ORAL | Status: DC | PRN
Start: 2024-02-08 — End: 2024-02-17
  Administered 2024-02-09 – 2024-02-17 (×35): 1 via ORAL
  Filled 2024-02-08 (×35): qty 1

## 2024-02-08 MED ORDER — PANTOPRAZOLE SODIUM 40 MG PO TBEC: 40.0000 mg | DELAYED_RELEASE_TABLET | Freq: Every day | ORAL | Status: DC | PRN

## 2024-02-08 MED ORDER — MELATONIN 3 MG PO TABS
3.0000 mg | ORAL_TABLET | Freq: Every evening | ORAL | Status: DC | PRN
  Administered 2024-02-16: 3 mg via ORAL
  Filled 2024-02-08 (×3): qty 1

## 2024-02-08 MED ORDER — RIVAROXABAN (XARELTO) VTE STARTER PACK (15 & 20 MG): ORAL_TABLET | Freq: Every day | ORAL | Status: DC

## 2024-02-08 MED ORDER — HYDROMORPHONE HCL 1 MG/ML IJ SOLN
2.0000 mg | Freq: Once | INTRAMUSCULAR | Status: AC
  Administered 2024-02-08: 2 mg via INTRAVENOUS
  Filled 2024-02-08: qty 2

## 2024-02-08 MED ORDER — SENNOSIDES-DOCUSATE SODIUM 8.6-50 MG PO TABS
1.0000 | ORAL_TABLET | Freq: Two times a day (BID) | ORAL | Status: DC
  Filled 2024-02-08: qty 1

## 2024-02-08 MED ORDER — ALBUTEROL SULFATE (2.5 MG/3ML) 0.083% IN NEBU: 3.0000 mL | INHALATION_SOLUTION | Freq: Four times a day (QID) | RESPIRATORY_TRACT | Status: DC | PRN

## 2024-02-08 MED ORDER — SODIUM CHLORIDE 0.9 % IV BOLUS
1000.0000 mL | Freq: Once | INTRAVENOUS | Status: AC
  Administered 2024-02-08: 1000 mL via INTRAVENOUS

## 2024-02-08 MED ORDER — VANCOMYCIN HCL IN DEXTROSE 1-5 GM/200ML-% IV SOLN
1000.0000 mg | Freq: Once | INTRAVENOUS | Status: DC
  Filled 2024-02-08: qty 200

## 2024-02-08 MED ORDER — RIVAROXABAN 20 MG PO TABS: 20.0000 mg | ORAL_TABLET | Freq: Every day | ORAL | 0 refills | Status: DC

## 2024-02-08 MED ORDER — HYDROMORPHONE HCL 1 MG/ML IJ SOLN
2.0000 mg | INTRAMUSCULAR | Status: DC | PRN
  Administered 2024-02-08 (×4): 2 mg via INTRAVENOUS
  Filled 2024-02-08 (×4): qty 2

## 2024-02-08 MED ORDER — HYDROMORPHONE 1 MG/ML IV SOLN
INTRAVENOUS | Status: DC
  Administered 2024-02-08: 6 mg via INTRAVENOUS
  Administered 2024-02-08: 30 mg via INTRAVENOUS
  Administered 2024-02-09: 1 mg via INTRAVENOUS
  Administered 2024-02-09: 5.5 mg via INTRAVENOUS
  Administered 2024-02-09: 3 mg via INTRAVENOUS
  Administered 2024-02-09: 0.1 mg via INTRAVENOUS
  Administered 2024-02-09: 4 mg via INTRAVENOUS
  Administered 2024-02-09 (×2): 5 mg via INTRAVENOUS
  Administered 2024-02-10: 30 mg via INTRAVENOUS
  Administered 2024-02-10: 4 mg via INTRAVENOUS
  Administered 2024-02-10: 5.5 mg via INTRAVENOUS
  Administered 2024-02-10: 9.5 mg via INTRAVENOUS
  Administered 2024-02-10: 3.5 mg via INTRAVENOUS
  Administered 2024-02-10: 2.5 mg via INTRAVENOUS
  Administered 2024-02-11: 3.5 mg via INTRAVENOUS
  Administered 2024-02-11: 3 mg via INTRAVENOUS
  Administered 2024-02-11: 30 mg via INTRAVENOUS
  Administered 2024-02-11: 4.5 mg via INTRAVENOUS
  Administered 2024-02-11: 4 mg via INTRAVENOUS
  Administered 2024-02-11: 5 mg via INTRAVENOUS
  Administered 2024-02-11: 4.5 mg via INTRAVENOUS
  Administered 2024-02-11: 5 mg via INTRAVENOUS
  Administered 2024-02-12: 1 mg via INTRAVENOUS
  Administered 2024-02-12: 30 mg via INTRAVENOUS
  Administered 2024-02-12 (×2): 0.5 mg via INTRAVENOUS
  Administered 2024-02-12: 6 mg via INTRAVENOUS
  Administered 2024-02-12 – 2024-02-13 (×2): 30 mg via INTRAVENOUS
  Administered 2024-02-14: 6 mg via INTRAVENOUS
  Administered 2024-02-14: 5.5 mg via INTRAVENOUS
  Administered 2024-02-14: 30 mg via INTRAVENOUS
  Administered 2024-02-14: 7.5 mg via INTRAVENOUS
  Administered 2024-02-15: 12 mg via INTRAVENOUS
  Administered 2024-02-15: 30 mg via INTRAVENOUS
  Administered 2024-02-15: 8.5 mg via INTRAVENOUS
  Filled 2024-02-08 (×9): qty 30

## 2024-02-08 MED ORDER — DIPHENHYDRAMINE HCL 50 MG/ML IJ SOLN
25.0000 mg | Freq: Once | INTRAMUSCULAR | Status: AC
  Administered 2024-02-08: 25 mg via INTRAVENOUS
  Filled 2024-02-08: qty 1

## 2024-02-08 MED ORDER — POLYETHYLENE GLYCOL 3350 17 G PO PACK: 17.0000 g | PACK | Freq: Every day | ORAL | Status: DC | PRN

## 2024-02-08 MED ORDER — DIPHENHYDRAMINE HCL 25 MG PO CAPS
25.0000 mg | ORAL_CAPSULE | ORAL | Status: DC | PRN
  Administered 2024-02-08: 25 mg via ORAL
  Filled 2024-02-08: qty 1

## 2024-02-08 MED ORDER — SODIUM CHLORIDE 0.9 % IV SOLN
2.0000 g | Freq: Once | INTRAVENOUS | Status: DC
  Filled 2024-02-08: qty 12.5

## 2024-02-08 MED ORDER — OXYCODONE-ACETAMINOPHEN 10-325 MG PO TABS: 1.0000 | ORAL_TABLET | ORAL | Status: DC | PRN

## 2024-02-08 MED ORDER — SODIUM CHLORIDE 0.9% FLUSH
10.0000 mL | Freq: Two times a day (BID) | INTRAVENOUS | Status: DC
  Administered 2024-02-08 – 2024-02-16 (×13): 10 mL

## 2024-02-08 MED ORDER — HEPARIN BOLUS VIA INFUSION
3000.0000 [IU] | Freq: Once | INTRAVENOUS | Status: AC
  Administered 2024-02-08: 3000 [IU] via INTRAVENOUS
  Filled 2024-02-08: qty 3000

## 2024-02-08 MED ORDER — SODIUM CHLORIDE 0.9% FLUSH: 9.0000 mL | INTRAVENOUS | Status: DC | PRN

## 2024-02-08 MED ORDER — NALOXONE HCL 4 MG/0.1ML NA LIQD: 1.0000 | NASAL | Status: DC | PRN

## 2024-02-08 MED ORDER — IOHEXOL 350 MG/ML SOLN
100.0000 mL | Freq: Once | INTRAVENOUS | Status: AC | PRN
  Administered 2024-02-08: 100 mL via INTRAVENOUS

## 2024-02-08 MED ORDER — HYDROXYUREA 500 MG PO CAPS
2000.0000 mg | ORAL_CAPSULE | Freq: Every day | ORAL | Status: DC
  Administered 2024-02-08 – 2024-02-16 (×9): 2000 mg via ORAL
  Filled 2024-02-08 (×9): qty 4

## 2024-02-08 MED ORDER — GUAIFENESIN ER 600 MG PO TB12
600.0000 mg | ORAL_TABLET | Freq: Two times a day (BID) | ORAL | Status: DC
  Administered 2024-02-08 – 2024-02-15 (×12): 600 mg via ORAL
  Filled 2024-02-08 (×13): qty 1

## 2024-02-08 MED ORDER — SULFAMETHOXAZOLE-TRIMETHOPRIM 800-160 MG PO TABS
1.0000 | ORAL_TABLET | Freq: Two times a day (BID) | ORAL | Status: DC
  Administered 2024-02-08 – 2024-02-09 (×2): 1 via ORAL
  Filled 2024-02-08 (×2): qty 1

## 2024-02-08 MED ORDER — NALOXONE HCL 0.4 MG/ML IJ SOLN: 0.4000 mg | INTRAMUSCULAR | Status: DC | PRN

## 2024-02-08 MED ORDER — SODIUM CHLORIDE 0.9% FLUSH: 10.0000 mL | INTRAVENOUS | Status: DC | PRN

## 2024-02-08 MED ORDER — ONDANSETRON HCL 4 MG/2ML IJ SOLN
4.0000 mg | Freq: Four times a day (QID) | INTRAMUSCULAR | Status: DC | PRN
  Administered 2024-02-09 – 2024-02-15 (×12): 4 mg via INTRAVENOUS
  Filled 2024-02-08 (×12): qty 2

## 2024-02-08 MED ORDER — CHLORHEXIDINE GLUCONATE CLOTH 2 % EX PADS
6.0000 | MEDICATED_PAD | Freq: Every day | CUTANEOUS | Status: DC
Start: 2024-02-08 — End: 2024-02-12
  Administered 2024-02-08 – 2024-02-10 (×3): 6 via TOPICAL

## 2024-02-08 MED ORDER — ACETAMINOPHEN 650 MG RE SUPP: 650.0000 mg | Freq: Four times a day (QID) | RECTAL | Status: DC | PRN

## 2024-02-08 MED ORDER — ACETAMINOPHEN 325 MG PO TABS
650.0000 mg | ORAL_TABLET | Freq: Four times a day (QID) | ORAL | Status: DC | PRN
  Administered 2024-02-09: 650 mg via ORAL
  Filled 2024-02-08: qty 2

## 2024-02-08 NOTE — ED Notes (Signed)
 Emptied urinal, 650 ml

## 2024-02-08 NOTE — H&P (Cosign Needed Addendum)
 H&P  Patient Demographics:  Martin [name], is a 33 y.o. male  MRN: [id number]   DOB - April 23, 1990  Admit Date - 02/07/2024  Outpatient Primary MD for the patient is [name], [name], [name]  Chief Complaint  Patient presents with   Sickle Cell Pain Crisis      HPI:   Martin [name]  is a 33 y.o. male, with history of sickle cell disease, history of acute chest syndrome, multiple sickle cell crisis, multiple ED visits and hospital admission, history of PE currently on Eliquis , chronic hypoxic respiratory failure on home oxygen , HIV infection and generalized anxiety disorder who presented to the Emergency department with major complaints of generalized body pain, shortness of breath beyond baseline, chest pain, abdominal pain, Patient reports that symptoms are not typical of his sickle cell pain and states,  I feel like the blood clot is back. Patient's last admission he was found to have new PE despite chronic anticoagulation patient is also complaining of right sided abdominal pain in the upper region. Denies fevers nausea or vomiting at home. Denies leg swelling, lightheadedness, dizziness or weakness. Reports that he has taken home oxycodone  without relief of pain. Denies dysuria. Denies flank pain.    ED Course:  Vitals:   02/09/24 1037 02/09/24 1207  BP: 114/78   Pulse: 98   Resp: 12 14  Temp: 97.7 F (36.5 C)   SpO2: 98% 94%   RBC 2.26, HGB 8.3, HCT, 22.6, MCH 36.7, Retic Ct 23.8, PT 17.1, INR 1.3 Patient was treated in the ED with IV fluid, IV dilaudid , with no resolution to symptoms. CT Abdomen, CT Chest and Chest x-ray completed.  Patient will be admitted for on going symptom management.    Review of systems:  In addition to the HPI above, patient reports No fever or chills No Headache, No changes with vision or hearing No problems swallowing food or liquids No chest pain, cough or shortness of breath No abdominal pain, No nausea or vomiting, Bowel movements are  regular No blood in stool or urine No dysuria No new skin rashes or bruises No new joints pains-aches No new weakness, tingling, numbness in any extremity No recent weight gain or loss No polyuria, polydypsia or polyphagia No significant Mental Stressors  A full 10 point Review of Systems was done, except as stated above, all other Review of Systems were negative.  With Past History of the following :   Past Medical History:  Diagnosis Date   Anxiety    Chest pain 02/04/2024   HIV (human immunodeficiency virus infection) (HCC)    Proteinuria    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HCC)    Vitamin D  deficiency 10/2018      Past Surgical History:  Procedure Laterality Date   IR IMAGING GUIDED PORT INSERTION  08/29/2019   IR REMOVAL TUN ACCESS W/ PORT W/O FL MOD SED  08/30/2020   TEE WITHOUT CARDIOVERSION N/A 08/30/2020   Procedure: TRANSESOPHAGEAL ECHOCARDIOGRAM (TEE);  Surgeon: Mona Vinie BROCKS, MD;  Location: [facility] ENDOSCOPY;  Service: Cardiovascular;  Laterality: N/A;     Social History:   Social History   Tobacco Use   Smoking status: Never   Smokeless tobacco: Never  Substance Use Topics   Alcohol  use: No     Lives - At home   Family History :   Family History  Problem Relation Age of Onset   Sickle cell trait Mother    Sickle cell trait Father    Birth defects Maternal Grandmother  Birth defects Paternal Grandmother      Home Medications:   Prior to Admission medications   Medication Sig Start Date End Date Taking? Authorizing Provider  abacavir -dolutegravir -lamiVUDine  (TRIUMEQ ) 600-50-300 MG tablet Take 1 tablet by mouth daily. 08/13/23  Yes Pearlean Manus, MD  albuterol  (VENTOLIN  HFA) 108 (90 Base) MCG/ACT inhaler Inhale 2 puffs into the lungs every 6 (six) hours as needed for wheezing or shortness of breath. 08/13/23  Yes Emokpae, Courage, MD  Cholecalciferol  (VITAMIN D3) 125 MCG (5000 UT) CAPS Take 5,000 Units by mouth every Tuesday.   Yes [provider]   cyclobenzaprine  (FLEXERIL ) 5 MG tablet Take 1 tablet (5 mg total) by mouth 2 (two) times daily as needed for muscle spasms. 02/04/24  Yes [name], [name], [name]  diphenhydrAMINE  (BENADRYL ) 25 MG tablet Take 1 tablet (25 mg total) by mouth every 4 (four) hours as needed for itching. 08/13/23  Yes Emokpae, Courage, MD  folic acid  (FOLVITE ) 400 MCG tablet Take 1 tablet (400 mcg total) by mouth daily. 08/13/23  Yes Emokpae, Courage, MD  gabapentin  (NEURONTIN ) 300 MG capsule Take 1 capsule (300 mg total) by mouth at bedtime as needed (nerve pain). 08/13/23  Yes Emokpae, Courage, MD  hydroxyurea  (HYDREA ) 500 MG capsule Take 4 capsules (2,000 mg total) by mouth at bedtime. 08/13/23  Yes Emokpae, Courage, MD  oxyCODONE -acetaminophen  (PERCOCET) 10-325 MG tablet Take 1 tablet by mouth every 4 (four) hours as needed for pain. 02/01/24  Yes [name], [name], [name]  Deferasirox  (JADENU ) 360 MG TABS Take 2 tablets (720 mg total) by mouth daily. Patient not taking: Reported on 01/04/2024 11/05/23   [name], [name], [name]  guaiFENesin  (MUCINEX ) 600 MG 12 hr tablet Take 1 tablet (600 mg total) by mouth 2 (two) times daily. Patient not taking: Reported on 01/17/2024 08/13/23 08/12/24  Pearlean Manus, MD  NARCAN  4 MG/0.1ML LIQD nasal spray kit Place 1 spray into the nose as needed (respiratory distress / opioid overdose). 01/07/23   [provider]  pantoprazole  (PROTONIX ) 40 MG tablet Take 1 tablet (40 mg total) by mouth daily as needed (acid reflux). Patient not taking: Reported on 01/04/2024 08/13/23   Pearlean Manus, MD  RIVAROXABAN TAD) VTE STARTER PACK (15 & 20 MG) Follow package directions: Take one 15mg  tablet by mouth twice a day. On day 22, switch to one 20mg  tablet once a day. Take with food. Patient not taking: Reported on 02/08/2024 01/21/24   Cherylene [name] HERO, NP  senna-docusate (SENOKOT-S) 8.6-50 MG tablet Take 2 tablets by mouth at bedtime. Patient not taking: Reported on 01/11/2024 08/13/23    Pearlean Manus, MD  sulfamethoxazole-trimethoprim (BACTRIM DS) 800-160 MG tablet Take 1 tablet by mouth 2 (two) times daily. 02/04/24   [name], [name], [name]  triamcinolone  cream (KENALOG ) 0.1 % Apply 1 Application topically 2 (two) times daily. Patient not taking: Reported on 01/04/2024 01/12/23   Tilford Bertram HERO, [name]     Allergies:   Allergies  Allergen Reactions   Ketorolac  Tromethamine  Swelling and Other (See Comments)    Patient reports facial edema and left arm edema after administration.   Tape Rash and Other (See Comments)    PLEASE DO NOT USE THE CLEAR, THICK, PLASTIC TAPE- only paper tape is tolerated    Wound Dressing Adhesive Rash     Physical Exam:   Vitals:   Vitals:   02/09/24 1037 02/09/24 1207  BP: 114/78   Pulse: 98   Resp: 12 14  Temp: 97.7 F (36.5 C)  SpO2: 98% 94%    Physical Exam: Constitutional: Patient appears well-developed and well-nourished. Not in obvious distress. HENT: Normocephalic, atraumatic, External right and left ear normal. Oropharynx is clear and moist.  Eyes: Conjunctivae and EOM are normal. PERRLA, no scleral icterus. Neck: Normal ROM. Neck supple. No JVD. No tracheal deviation. No thyromegaly. CVS: RRR, S1/S2 +, no murmurs, no gallops, no carotid bruit.  Pulmonary: Shortness of breath, wheezing  Abdominal: Soft. BS +, no distension, tenderness, rebound or guarding.  Musculoskeletal: Generalize body tenderness Lymphadenopathy: No lymphadenopathy noted, cervical, inguinal or axillary Neuro: Alert. Normal reflexes, muscle tone coordination. No cranial nerve deficit. Skin: Skin is warm and dry. No rash noted. Not diaphoretic. No erythema. No pallor. Psychiatric: Normal mood and affect. Behavior, judgment, thought content normal.   Data Review:   CBC Recent Labs  Lab 02/08/24 0038 02/08/24 0802 02/09/24 0638  WBC 9.3 11.6* 13.1*  HGB 8.3* 7.9* 7.9*  HCT 22.6* 22.0* 22.6*  PLT 267 259 224  MCV 100.0 102.3* 102.7*   MCH 36.7* 36.7* 35.9*  MCHC 36.7* 35.9 35.0  RDW 27.8* 29.1* 26.3*  LYMPHSABS  --  5.7*  --   MONOABS  --  1.3*  --   EOSABS  --  0.1  --   BASOSABS  --  0.0  --    ------------------------------------------------------------------------------------------------------------------  Chemistries  Recent Labs  Lab 02/08/24 0038 02/08/24 0520  NA 137  --   K 4.3  --   CL 105  --   CO2 22  --   GLUCOSE 122*  --   BUN 19  --   CREATININE 1.06  --   CALCIUM  8.6*  --   MG  --  2.0  AST 59*  --   ALT 22  --   ALKPHOS 61  --   BILITOT 11.1*  --    ------------------------------------------------------------------------------------------------------------------ estimated creatinine clearance is 87.9 mL/min (by C-G formula based on SCr of 1.06 mg/dL). ------------------------------------------------------------------------------------------------------------------ No results for input(s): TSH, T4TOTAL, T3FREE, THYROIDAB in the last 72 hours.  Invalid input(s): FREET3  Coagulation profile Recent Labs  Lab 02/08/24 0139  INR 1.3*   ------------------------------------------------------------------------------------------------------------------- No results for input(s): DDIMER in the last 72 hours. -------------------------------------------------------------------------------------------------------------------  Cardiac Enzymes No results for input(s): CKMB, TROPONINI, MYOGLOBIN in the last 168 hours.  Invalid input(s): CK ------------------------------------------------------------------------------------------------------------------    Component Value Date/Time   BNP 91.4 05/27/2021 2216    ---------------------------------------------------------------------------------------------------------------  Urinalysis    Component Value Date/Time   COLORURINE YELLOW 02/08/2024 0333   APPEARANCEUR CLEAR 02/08/2024 0333   LABSPEC 1.016 02/08/2024 0333    PHURINE 5.0 02/08/2024 0333   GLUCOSEU NEGATIVE 02/08/2024 0333   HGBUR MODERATE (A) 02/08/2024 0333   BILIRUBINUR NEGATIVE 02/08/2024 0333   BILIRUBINUR moderate (A) 02/04/2024 1613   BILIRUBINUR neg 11/27/2019 1149   KETONESUR NEGATIVE 02/08/2024 0333   PROTEINUR 30 (A) 02/08/2024 0333   UROBILINOGEN 2.0 (A) 02/04/2024 1613   UROBILINOGEN 4.0 (H) 05/21/2009 1944   NITRITE NEGATIVE 02/08/2024 0333   LEUKOCYTESUR TRACE (A) 02/08/2024 0333    ----------------------------------------------------------------------------------------------------------------   Imaging Results:    DG CHEST PORT 1 VIEW Result Date: 02/09/2024 CLINICAL DATA:  Increasing shortness of breath EXAM: PORTABLE CHEST 1 VIEW COMPARISON:  Film from the previous day. FINDINGS: Cardiac shadow is enlarged but stable accentuated by the portable technique. Right chest wall port is again seen and stable. Increasing central vascular congestion is noted. Increased airspace opacity on the right is noted likely representing edema. No acute bony  abnormality is seen. IMPRESSION: Increasing vascular congestion and likely edema particularly on the right. Electronically Signed   By: Oneil Devonshire M.D.   On: 02/09/2024 02:18   CT Angio Chest PE W and/or Wo Contrast Result Date: 02/08/2024 EXAM: CTA CHEST, ABDOMEN AND PELVIS WITHOUT AND WITH CONTRAST 02/08/2024 03:23:01 AM TECHNIQUE: CTA of the chest was performed without and with the administration of intravenous contrast. CTA of the abdomen and pelvis was performed without and with the administration of intravenous contrast. Multiplanar reformatted images are provided for review. MIP images are provided for review. Automated exposure control, iterative reconstruction, and/or weight based adjustment of the mA/kV was utilized to reduce the radiation dose to as low as reasonably achievable. COMPARISON: None available. CLINICAL HISTORY: Pulmonary embolism (PE) suspected, high prob. Placed  patient on 4L [place]. Patient is normally on this amount when he is in crisis. O2 is staying around 90-92 which patient states he stays in the low 90s normally. 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 FINDINGS: VASCULATURE: AORTA: No acute finding. No abdominal aortic aneurysm. No dissection. PULMONARY ARTERIES: The main pulmonary artery is enlarged in caliber measuring up to 4 cm. Interval increase in size of a right lower lobe segmental and subsegmental pulmonary embolus (11:227). Question interval development of a left upper lobe subsegmental pulmonary embolus (11:100). GREAT VESSELS OF AORTIC ARCH: No acute finding. No dissection. No arterial occlusion or significant stenosis. CELIAC TRUNK: No acute finding. No occlusion or significant stenosis. SUPERIOR MESENTERIC ARTERY: No acute finding. No occlusion or significant stenosis. INFERIOR MESENTERIC ARTERY: No acute finding. No occlusion or significant stenosis. RENAL ARTERIES: No acute finding. No occlusion or significant stenosis. ILIAC ARTERIES: No acute finding. No occlusion or significant stenosis. CHEST: MEDIASTINUM: Stable Enlarged right hilar lymph nodes measuring up to 2.3 cm. Stable Enlarged left hilar lymph nodes measuring up to 1 cm. Stable Enlarged prevascular lymph node measuring up to 1.2 cm. Stable enlarged right heart Otherwise heart and pericardium demonstrate no acute abnormality. LUNGS AND PLEURA: Chronic architectural distortion. Mild paraseptal emphysematous changes. Persistent interlobular septal wall thickening with mosaic attenuation of the lungs. Interval increase of patchy ground glass airspace opacities that are most prominent along the right middle lobe. Interlobular septal wall thickening. No pulmonary infarction. No evidence of pleural effusion or pneumothorax. THORACIC BONES AND SOFT TISSUES: Slight heterogeneity of the axial and appendicular skeleton likely related to  sickle cell disease. Prominent but not enlarged bilateral axillary lymph nodes. ABDOMEN AND PELVIS: LIVER: Portal edema. Multiple vague pericentimeter hepatic lesions more prominent along the left hepatic lobe (2:27). Enlarged liver measuring up to 22 cm. GALLBLADDER AND BILE DUCTS: Status post cholecystectomy. No biliary ductal dilatation. 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 PANCREAS: The pancreas is unremarkable. ADRENAL GLANDS: Bilateral adrenal glands demonstrate no acute abnormality. KIDNEYS, URETERS AND BLADDER: No stones in the kidneys or ureters. No hydronephrosis. No perinephric or periureteral stranding. Urinary bladder is unremarkable. GI AND BOWEL: Stomach and duodenal sweep demonstrate no acute abnormality. There is no bowel obstruction. No abnormal bowel wall thickening or distension. The appendix is not definitely identified, with no inflammatory changes in the right lower quadrant to suggest acute appendicitis. REPRODUCTIVE: Reproductive organs are unremarkable. PERITONEUM AND RETROPERITONEUM: No ascites or free air. LYMPH NODES: No lymphadenopathy. ABDOMINAL BONES AND SOFT TISSUES: Slight heterogeneity of the axial and appendicular skeleton likely related to sickle cell disease. No acute  soft tissue abnormality. IMPRESSION: 1. Interval increase in size of a right lower lobe segmental and subsegmental pulmonary embolus, with possible new left upper lobe subsegmental pulmonary embolus. No pulmonary infarction. Chronic right heart failure with no findings to suggest worsened acute right heart strain. 2. Persistent mosaic attenuation and interlobular septal thickening of the lungs with superimposed interval increase of patchy ground glass airspace opacities that are most prominent along the right middle lobe has been questioned for infection /inflammation . 3. Enlarged main pulmonary artery measuring up to 4 cm. Finding consistent with  pulmonary hypertension. 4. Hepatomegaly with multiple vague pericentimeter hepatic lesions more prominent along the left hepatic lobe and portal edema. Question developing nutmeg liver morphology. 5. Stable and large bilateral hilar and mediastinal lymph nodes, as well as prominence but not enlarged bilateral axillary lymph nodes. 6. Other, non-acute and/or normal findings as above. Electronically signed by: Morgane Naveau MD 02/08/2024 03:45 AM EDT RP Workstation: [id number]   CT ABDOMEN PELVIS W CONTRAST Result Date: 02/08/2024 EXAM: CTA CHEST, ABDOMEN AND PELVIS WITHOUT AND WITH CONTRAST 02/08/2024 03:23:01 AM TECHNIQUE: CTA of the chest was performed without and with the administration of intravenous contrast. CTA of the abdomen and pelvis was performed without and with the administration of intravenous contrast. Multiplanar reformatted images are provided for review. MIP images are provided for review. Automated exposure control, iterative reconstruction, and/or weight based adjustment of the mA/kV was utilized to reduce the radiation dose to as low as reasonably achievable. COMPARISON: None available. CLINICAL HISTORY: Pulmonary embolism (PE) suspected, high prob. Placed patient on 4L [place]. Patient is normally on this amount when he is in crisis. O2 is staying around 90-92 which patient states he stays in the low 90s normally. 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 FINDINGS: VASCULATURE: AORTA: No acute finding. No abdominal aortic aneurysm. No dissection. PULMONARY ARTERIES: The main pulmonary artery is enlarged in caliber measuring up to 4 cm. Interval increase in size of a right lower lobe segmental and subsegmental pulmonary embolus (11:227). Question interval development of a left upper lobe subsegmental pulmonary embolus (11:100). GREAT VESSELS OF AORTIC ARCH: No acute finding. No dissection. No arterial occlusion or significant  stenosis. CELIAC TRUNK: No acute finding. No occlusion or significant stenosis. SUPERIOR MESENTERIC ARTERY: No acute finding. No occlusion or significant stenosis. INFERIOR MESENTERIC ARTERY: No acute finding. No occlusion or significant stenosis. RENAL ARTERIES: No acute finding. No occlusion or significant stenosis. ILIAC ARTERIES: No acute finding. No occlusion or significant stenosis. CHEST: MEDIASTINUM: Stable Enlarged right hilar lymph nodes measuring up to 2.3 cm. Stable Enlarged left hilar lymph nodes measuring up to 1 cm. Stable Enlarged prevascular lymph node measuring up to 1.2 cm. Stable enlarged right heart Otherwise heart and pericardium demonstrate no acute abnormality. LUNGS AND PLEURA: Chronic architectural distortion. Mild paraseptal emphysematous changes. Persistent interlobular septal wall thickening with mosaic attenuation of the lungs. Interval increase of patchy ground glass airspace opacities that are most prominent along the right middle lobe. Interlobular septal wall thickening. No pulmonary infarction. No evidence of pleural effusion or pneumothorax. THORACIC BONES AND SOFT TISSUES: Slight heterogeneity of the axial and appendicular skeleton likely related to sickle cell disease. Prominent but not enlarged bilateral axillary lymph nodes. ABDOMEN AND PELVIS: LIVER: Portal edema. Multiple vague pericentimeter hepatic lesions more prominent along the left hepatic lobe (2:27). Enlarged liver measuring up to 22 cm. GALLBLADDER AND BILE DUCTS: Status post cholecystectomy. No biliary  ductal dilatation. 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 PANCREAS: The pancreas is unremarkable. ADRENAL GLANDS: Bilateral adrenal glands demonstrate no acute abnormality. KIDNEYS, URETERS AND BLADDER: No stones in the kidneys or ureters. No hydronephrosis. No perinephric or periureteral stranding. Urinary bladder is unremarkable. GI AND BOWEL: Stomach and  duodenal sweep demonstrate no acute abnormality. There is no bowel obstruction. No abnormal bowel wall thickening or distension. The appendix is not definitely identified, with no inflammatory changes in the right lower quadrant to suggest acute appendicitis. REPRODUCTIVE: Reproductive organs are unremarkable. PERITONEUM AND RETROPERITONEUM: No ascites or free air. LYMPH NODES: No lymphadenopathy. ABDOMINAL BONES AND SOFT TISSUES: Slight heterogeneity of the axial and appendicular skeleton likely related to sickle cell disease. No acute soft tissue abnormality. IMPRESSION: 1. Interval increase in size of a right lower lobe segmental and subsegmental pulmonary embolus, with possible new left upper lobe subsegmental pulmonary embolus. No pulmonary infarction. Chronic right heart failure with no findings to suggest worsened acute right heart strain. 2. Persistent mosaic attenuation and interlobular septal thickening of the lungs with superimposed interval increase of patchy ground glass airspace opacities that are most prominent along the right middle lobe has been questioned for infection /inflammation . 3. Enlarged main pulmonary artery measuring up to 4 cm. Finding consistent with pulmonary hypertension. 4. Hepatomegaly with multiple vague pericentimeter hepatic lesions more prominent along the left hepatic lobe and portal edema. Question developing nutmeg liver morphology. 5. Stable and large bilateral hilar and mediastinal lymph nodes, as well as prominence but not enlarged bilateral axillary lymph nodes. 6. Other, non-acute and/or normal findings as above. Electronically signed by: Morgane Naveau MD 02/08/2024 03:45 AM EDT RP Workstation: [id number]   DG Chest Portable 1 View Result Date: 02/08/2024 EXAM: 1 VIEW(S) XRAY OF THE CHEST 02/08/2024 12:28:00 AM COMPARISON: 01/16/2024 CLINICAL HISTORY: sob beyond baseline, chest pain. Pt having sickle cell criss, SOB and chest pain. FINDINGS: LINES, TUBES AND  DEVICES: Similar positioning of the right chest port-a-cath with loop catheter tubing at the thoracic inlet. The tip is in the low SVC. LUNGS AND PLEURA: Prominent central pulmonary arteries. Similar bilateral interstitial coarsening. Question vague ground-glass opacities in the mid lungs. Right apical pleural parenchymal scarring. No pulmonary edema. No pleural effusion. No pneumothorax. HEART AND MEDIASTINUM: Stable cardiomediastinal silhouette. BONES AND SOFT TISSUES: No acute osseous abnormality. IMPRESSION: 1. Similar interstitial coarsening with possible vague mid-lung ground-glass opacities. This may be projectional artifact, or due to edema or infection. Electronically signed by: Norman Gatlin MD 02/08/2024 12:36 AM EDT RP Workstation: [id number]     Assessment & Plan:  Principal Problem:   Acute pulmonary embolism (HCC) Active Problems:   Generalized anxiety disorder   HIV (human immunodeficiency virus infection) (HCC)   Chronic pain syndrome   Leukocytosis   Acute on chronic respiratory failure with hypoxia (HCC)   AKI (acute kidney injury)   Sickle cell pain crisis (HCC)   Hb Sickle Cell Disease with crisis: Admit patient, start IVF 0.45% Saline @ 125 mls/hour, start weight based Dilaudid  PCA, Restart oral home pain medications, Monitor vitals very closely, Re-evaluate pain scale regularly, 4 L of Oxygen  by [place], Patient will be re-evaluated for pain in the context of function and relationship to baseline as care progresses. Acute Pulmonary embolism [facility]): last admission patient was positive for a small right lower lobe segmental or subsegmental pulmonary embolus. New CT: Interval increase in size of a right lower lobe segmental and subsegmental pulmonary embolus, with possible  new left upper lobe subsegmental pulmonary embolus. No pulmonary infarction. Continue Heparin  as ordered.  Leukocytosis: Normal  Anemia of Chronic Disease: HGB at patient's baseline will continue to monitor daily  CBC.  Chronic pain Syndrome: Continue oral home pain medication AKI (Acute kidney disease): Discontinue nephrotoxic medication.  HIV (human immunodeficiency virus infection ): Clinically stable. Continue home medication.  Chronic hypoxic respiratory failure: Patient is saturating moderately on 6l oxygen  via nasal cannula.  Continue incentive spirometry.   Generalized anxiety disorder: Patient is clinically stable.  He denies any suicidal ideations or thoughts.  Continue home medications.    DVT Prophylaxis: heparin   AM Labs Ordered, also please review Full Orders  Family Communication: Admission, patient's condition and plan of care including tests being ordered have been discussed with the patient who indicate understanding and agree with the plan and Code Status.  Code Status: Full Code  Consults called: None    Admission status: Inpatient    Time spent in minutes : 50 minutes  [name] Cover NP  02/09/2024 at 1:28 PM

## 2024-02-08 NOTE — ED Notes (Addendum)
 Writer checked on pt with him advising his pain was a 10/10. To note offcoming nurse advised when pt is having a sickle cell crisis his oxygen  level will be mid 80s to low 90s. Pt currently on 4 liters of oxygen .

## 2024-02-08 NOTE — ED Provider Notes (Signed)
 [place] EMERGENCY DEPARTMENT AT [facility] Provider Note   CSN: [id number] Arrival date & time: 02/07/24  2232     Patient presents with: Sickle Cell Pain Crisis   Martin [name] is a 33 y.o. male with history of chest pain, anxiety, HLD, sickle cell disease, chronic oxygen  use, chronic use of opioids.  Presents to ED complaining of shortness of breath beyond baseline, chest pain, abdominal pain.  Reports he feels he is going through sickle cell crisis.  States that he also feels as if he has a blood clot in his lungs.  Reports that for the last 3 days he has been more short of breath beyond baseline.  Recently found to have new PE despite chronic anticoagulation and was admitted to the hospital.  He reports that he had his medication changed to Xarelto but due to insurance issues he has not been able to take this medication.  He has not taken anticoagulation since being discharged from the hospital.  He reports he has had chest pain as well over the last 3 days.  Also complaining of right sided abdominal pain in the upper region.  Denies fevers nausea or vomiting at home.  Denies leg swelling, lightheadedness, dizziness or weakness.  Reports that he has taken home oxycodone  without relief of pain.  Denies dysuria.  Denies flank pain.  He reports he is concerned that he has new blood clot.  HPI     Prior to Admission medications   Medication Sig Start Date End Date Taking? Authorizing Provider  abacavir -dolutegravir -lamiVUDine  (TRIUMEQ ) 600-50-300 MG tablet Take 1 tablet by mouth daily. 08/13/23   Pearlean Manus, MD  albuterol  (VENTOLIN  HFA) 108 (90 Base) MCG/ACT inhaler Inhale 2 puffs into the lungs every 6 (six) hours as needed for wheezing or shortness of breath. Patient not taking: Reported on 02/04/2024 08/13/23   Pearlean Manus, MD  Cholecalciferol  (VITAMIN D3) 125 MCG (5000 UT) CAPS Take 5,000 Units by mouth every Tuesday.    [provider]   cyclobenzaprine  (FLEXERIL ) 5 MG tablet Take 1 tablet (5 mg total) by mouth 2 (two) times daily as needed for muscle spasms. 02/04/24   [name], [name], [name]  Deferasirox  (JADENU ) 360 MG TABS Take 2 tablets (720 mg total) by mouth daily. Patient not taking: Reported on 02/04/2024 11/05/23   [name], [name], [name]  diphenhydrAMINE  (BENADRYL ) 25 MG tablet Take 1 tablet (25 mg total) by mouth every 4 (four) hours as needed for itching. 08/13/23   Pearlean Manus, MD  folic acid  (FOLVITE ) 400 MCG tablet Take 1 tablet (400 mcg total) by mouth daily. 08/13/23   Pearlean Manus, MD  gabapentin  (NEURONTIN ) 300 MG capsule Take 1 capsule (300 mg total) by mouth at bedtime as needed (nerve pain). 08/13/23   Pearlean Manus, MD  guaiFENesin  (MUCINEX ) 600 MG 12 hr tablet Take 1 tablet (600 mg total) by mouth 2 (two) times daily. Patient not taking: Reported on 02/04/2024 08/13/23 08/12/24  Pearlean Manus, MD  hydroxyurea  (HYDREA ) 500 MG capsule Take 4 capsules (2,000 mg total) by mouth at bedtime. 08/13/23   Pearlean Manus, MD  NARCAN  4 MG/0.1ML LIQD nasal spray kit Place 1 spray into the nose as needed (respiratory distress / opioid overdose). 01/07/23   [provider]  oxyCODONE -acetaminophen  (PERCOCET) 10-325 MG tablet Take 1 tablet by mouth every 4 (four) hours as needed for pain. 02/01/24   [name], [name], [name]  pantoprazole  (PROTONIX ) 40 MG tablet Take 1 tablet (40 mg total) by  mouth daily as needed (acid reflux). Patient not taking: Reported on 01/04/2024 08/13/23   Pearlean Manus, MD  RIVAROXABAN TAD) VTE STARTER PACK (15 & 20 MG) Follow package directions: Take one 15mg  tablet by mouth twice a day. On day 22, switch to one 20mg  tablet once a day. Take with food. 01/21/24   Ijaola, Onyeje M, NP  senna-docusate (SENOKOT-S) 8.6-50 MG tablet Take 2 tablets by mouth at bedtime. Patient not taking: Reported on 02/04/2024 08/13/23   Pearlean Manus, MD  sulfamethoxazole-trimethoprim  (BACTRIM DS) 800-160 MG tablet Take 1 tablet by mouth 2 (two) times daily. 02/04/24   [name], [name], [name]  triamcinolone  cream (KENALOG ) 0.1 % Apply 1 Application topically 2 (two) times daily. Patient not taking: Reported on 02/04/2024 01/12/23   Tilford Bertram HERO, [name]    Allergies: Ketorolac  tromethamine , Tape, and Wound dressing adhesive    Review of Systems  Respiratory:  Positive for shortness of breath.   Cardiovascular:  Positive for chest pain.  Gastrointestinal:  Positive for abdominal pain.  All other systems reviewed and are negative.   Updated Vital Signs BP 101/79   Pulse 98   Temp 98.6 F (37 C)   Resp 15   SpO2 90%   Physical Exam Vitals and nursing note reviewed.  Constitutional:      General: He is not in acute distress.    Appearance: He is well-developed.  HENT:     Head: Normocephalic and atraumatic.  Eyes:     Conjunctiva/sclera: Conjunctivae normal.  Cardiovascular:     Rate and Rhythm: Regular rhythm. Tachycardia present.     Heart sounds: No murmur heard. Pulmonary:     Effort: Pulmonary effort is normal. No respiratory distress.     Breath sounds: Normal breath sounds.  Abdominal:     Palpations: Abdomen is soft.     Tenderness: There is abdominal tenderness.     Comments: Generalized abdominal tenderness.  Nonfocal.  Musculoskeletal:        General: No swelling.     Cervical back: Neck supple.     Right lower leg: No edema.     Left lower leg: No edema.  Skin:    General: Skin is warm and dry.     Capillary Refill: Capillary refill takes less than 2 seconds.  Neurological:     Mental Status: He is alert and oriented to person, place, and time. Mental status is at baseline.  Psychiatric:        Mood and Affect: Mood normal.     (all labs ordered are listed, but only abnormal results are displayed) Labs Reviewed  CBC - Abnormal; Notable for the following components:      Result Value   RBC 2.26 (*)    Hemoglobin 8.3 (*)    HCT  22.6 (*)    MCH 36.7 (*)    MCHC 36.7 (*)    RDW 27.8 (*)    nRBC 28.7 (*)    All other components within normal limits  COMPREHENSIVE METABOLIC PANEL WITH GFR - Abnormal; Notable for the following components:   Glucose, Bld 122 (*)    Calcium  8.6 (*)    AST 59 (*)    Total Bilirubin 11.1 (*)    All other components within normal limits  RETICULOCYTES - Abnormal; Notable for the following components:   Retic Ct Pct 23.8 (*)    RBC. 2.26 (*)    Retic Count, Absolute 536.8 (*)    Immature Retic Fract 33.5 (*)  All other components within normal limits  URINALYSIS, ROUTINE W REFLEX MICROSCOPIC - Abnormal; Notable for the following components:   Hgb urine dipstick MODERATE (*)    Protein, ur 30 (*)    Leukocytes,Ua TRACE (*)    All other components within normal limits  PROTIME-INR - Abnormal; Notable for the following components:   Prothrombin Time 17.1 (*)    INR 1.3 (*)    All other components within normal limits  PROCALCITONIN  CBC WITH DIFFERENTIAL/PLATELET  MAGNESIUM   TYPE AND SCREEN  TROPONIN T, HIGH SENSITIVITY  TROPONIN T, HIGH SENSITIVITY    EKG: EKG Interpretation Date/Time:  Monday February 07 2024 23:05:31 EDT Ventricular Rate:  109 PR Interval:  149 QRS Duration:  91 QT Interval:  347 QTC Calculation: 468 R Axis:   105  Text Interpretation: Sinus tachycardia Biatrial enlargement RVH with secondary repolarization abnrm Minimal ST elevation, lateral leads No significant change was found Confirmed by Carita Senior [id number]) on 02/07/2024 11:20:46 PM  Radiology: CT Angio Chest PE W and/or Wo Contrast Result Date: 02/08/2024 EXAM: CTA CHEST, ABDOMEN AND PELVIS WITHOUT AND WITH CONTRAST 02/08/2024 03:23:01 AM TECHNIQUE: CTA of the chest was performed without and with the administration of intravenous contrast. CTA of the abdomen and pelvis was performed without and with the administration of intravenous contrast. Multiplanar reformatted images are provided for  review. MIP images are provided for review. Automated exposure control, iterative reconstruction, and/or weight based adjustment of the mA/kV was utilized to reduce the radiation dose to as low as reasonably achievable. COMPARISON: None available. CLINICAL HISTORY: Pulmonary embolism (PE) suspected, high prob. Placed patient on 4L [place]. Patient is normally on this amount when he is in crisis. O2 is staying around 90-92 which patient states he stays in the low 90s normally. 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 FINDINGS: VASCULATURE: AORTA: No acute finding. No abdominal aortic aneurysm. No dissection. PULMONARY ARTERIES: The main pulmonary artery is enlarged in caliber measuring up to 4 cm. Interval increase in size of a right lower lobe segmental and subsegmental pulmonary embolus (11:227). Question interval development of a left upper lobe subsegmental pulmonary embolus (11:100). GREAT VESSELS OF AORTIC ARCH: No acute finding. No dissection. No arterial occlusion or significant stenosis. CELIAC TRUNK: No acute finding. No occlusion or significant stenosis. SUPERIOR MESENTERIC ARTERY: No acute finding. No occlusion or significant stenosis. INFERIOR MESENTERIC ARTERY: No acute finding. No occlusion or significant stenosis. RENAL ARTERIES: No acute finding. No occlusion or significant stenosis. ILIAC ARTERIES: No acute finding. No occlusion or significant stenosis. CHEST: MEDIASTINUM: Stable Enlarged right hilar lymph nodes measuring up to 2.3 cm. Stable Enlarged left hilar lymph nodes measuring up to 1 cm. Stable Enlarged prevascular lymph node measuring up to 1.2 cm. Stable enlarged right heart Otherwise heart and pericardium demonstrate no acute abnormality. LUNGS AND PLEURA: Chronic architectural distortion. Mild paraseptal emphysematous changes. Persistent interlobular septal wall thickening with mosaic attenuation of the lungs. Interval increase  of patchy ground glass airspace opacities that are most prominent along the right middle lobe. Interlobular septal wall thickening. No pulmonary infarction. No evidence of pleural effusion or pneumothorax. THORACIC BONES AND SOFT TISSUES: Slight heterogeneity of the axial and appendicular skeleton likely related to sickle cell disease. Prominent but not enlarged bilateral axillary lymph nodes. ABDOMEN AND PELVIS: LIVER: Portal edema. Multiple vague pericentimeter hepatic lesions more prominent along the left hepatic lobe (2:27). Enlarged liver measuring up to 22  cm. GALLBLADDER AND BILE DUCTS: Status post cholecystectomy. No biliary ductal dilatation. 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 PANCREAS: The pancreas is unremarkable. ADRENAL GLANDS: Bilateral adrenal glands demonstrate no acute abnormality. KIDNEYS, URETERS AND BLADDER: No stones in the kidneys or ureters. No hydronephrosis. No perinephric or periureteral stranding. Urinary bladder is unremarkable. GI AND BOWEL: Stomach and duodenal sweep demonstrate no acute abnormality. There is no bowel obstruction. No abnormal bowel wall thickening or distension. The appendix is not definitely identified, with no inflammatory changes in the right lower quadrant to suggest acute appendicitis. REPRODUCTIVE: Reproductive organs are unremarkable. PERITONEUM AND RETROPERITONEUM: No ascites or free air. LYMPH NODES: No lymphadenopathy. ABDOMINAL BONES AND SOFT TISSUES: Slight heterogeneity of the axial and appendicular skeleton likely related to sickle cell disease. No acute soft tissue abnormality. IMPRESSION: 1. Interval increase in size of a right lower lobe segmental and subsegmental pulmonary embolus, with possible new left upper lobe subsegmental pulmonary embolus. No pulmonary infarction. Chronic right heart failure with no findings to suggest worsened acute right heart strain. 2. Persistent mosaic  attenuation and interlobular septal thickening of the lungs with superimposed interval increase of patchy ground glass airspace opacities that are most prominent along the right middle lobe has been questioned for infection /inflammation . 3. Enlarged main pulmonary artery measuring up to 4 cm. Finding consistent with pulmonary hypertension. 4. Hepatomegaly with multiple vague pericentimeter hepatic lesions more prominent along the left hepatic lobe and portal edema. Question developing nutmeg liver morphology. 5. Stable and large bilateral hilar and mediastinal lymph nodes, as well as prominence but not enlarged bilateral axillary lymph nodes. 6. Other, non-acute and/or normal findings as above. Electronically signed by: Morgane Naveau MD 02/08/2024 03:45 AM EDT RP Workstation: [id number]   CT ABDOMEN PELVIS W CONTRAST Result Date: 02/08/2024 EXAM: CTA CHEST, ABDOMEN AND PELVIS WITHOUT AND WITH CONTRAST 02/08/2024 03:23:01 AM TECHNIQUE: CTA of the chest was performed without and with the administration of intravenous contrast. CTA of the abdomen and pelvis was performed without and with the administration of intravenous contrast. Multiplanar reformatted images are provided for review. MIP images are provided for review. Automated exposure control, iterative reconstruction, and/or weight based adjustment of the mA/kV was utilized to reduce the radiation dose to as low as reasonably achievable. COMPARISON: None available. CLINICAL HISTORY: Pulmonary embolism (PE) suspected, high prob. Placed patient on 4L [place]. Patient is normally on this amount when he is in crisis. O2 is staying around 90-92 which patient states he stays in the low 90s normally. 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 FINDINGS: VASCULATURE: AORTA: No acute finding. No abdominal aortic aneurysm. No dissection. PULMONARY ARTERIES: The main pulmonary artery is enlarged in caliber  measuring up to 4 cm. Interval increase in size of a right lower lobe segmental and subsegmental pulmonary embolus (11:227). Question interval development of a left upper lobe subsegmental pulmonary embolus (11:100). GREAT VESSELS OF AORTIC ARCH: No acute finding. No dissection. No arterial occlusion or significant stenosis. CELIAC TRUNK: No acute finding. No occlusion or significant stenosis. SUPERIOR MESENTERIC ARTERY: No acute finding. No occlusion or significant stenosis. INFERIOR MESENTERIC ARTERY: No acute finding. No occlusion or significant stenosis. RENAL ARTERIES: No acute finding. No occlusion or significant stenosis. ILIAC ARTERIES: No acute finding. No occlusion or significant stenosis. CHEST: MEDIASTINUM: Stable Enlarged right hilar lymph nodes measuring up to 2.3 cm. Stable Enlarged left hilar lymph nodes  measuring up to 1 cm. Stable Enlarged prevascular lymph node measuring up to 1.2 cm. Stable enlarged right heart Otherwise heart and pericardium demonstrate no acute abnormality. LUNGS AND PLEURA: Chronic architectural distortion. Mild paraseptal emphysematous changes. Persistent interlobular septal wall thickening with mosaic attenuation of the lungs. Interval increase of patchy ground glass airspace opacities that are most prominent along the right middle lobe. Interlobular septal wall thickening. No pulmonary infarction. No evidence of pleural effusion or pneumothorax. THORACIC BONES AND SOFT TISSUES: Slight heterogeneity of the axial and appendicular skeleton likely related to sickle cell disease. Prominent but not enlarged bilateral axillary lymph nodes. ABDOMEN AND PELVIS: LIVER: Portal edema. Multiple vague pericentimeter hepatic lesions more prominent along the left hepatic lobe (2:27). Enlarged liver measuring up to 22 cm. GALLBLADDER AND BILE DUCTS: Status post cholecystectomy. No biliary ductal dilatation. SPLEEN: Calcified atrophic spleen consistent with autosplenectomy in the setting of  sickle cell. PANCREAS: The pancreas is unremarkable. ADRENAL GLANDS: Bilateral adrenal glands demonstrate no acute abnormality. KIDNEYS, URETERS AND BLADDER: No stones in the kidneys or ureters. No hydronephrosis. No perinephric or periureteral stranding. Urinary bladder is unremarkable. GI AND BOWEL: Stomach and duodenal sweep demonstrate no acute abnormality. There is no bowel obstruction. No abnormal bowel wall thickening or distension. The appendix is not definitely identified, with no inflammatory changes in the right lower quadrant to suggest acute appendicitis. REPRODUCTIVE: Reproductive organs are unremarkable. PERITONEUM AND RETROPERITONEUM: No ascites or free air. LYMPH NODES: No lymphadenopathy. ABDOMINAL BONES AND SOFT TISSUES: Slight heterogeneity of the axial and appendicular skeleton likely related to sickle cell disease. No acute soft tissue abnormality. IMPRESSION: 1. Interval increase in size of a right lower lobe segmental and subsegmental pulmonary embolus, with possible new left upper lobe subsegmental pulmonary embolus. No pulmonary infarction. Chronic right heart failure with no findings to suggest worsened acute right heart strain. 2. Persistent mosaic attenuation and interlobular septal thickening of the lungs with superimposed interval increase of patchy ground glass airspace opacities that are most prominent along the right middle lobe has been questioned for infection /inflammation . 3. Enlarged main pulmonary artery measuring up to 4 cm. Finding consistent with pulmonary hypertension. 4. Hepatomegaly with multiple vague pericentimeter hepatic lesions more prominent along the left hepatic lobe and portal edema. Question developing nutmeg liver morphology. 5. Stable and large bilateral hilar and mediastinal lymph nodes, as well as prominence but not enlarged bilateral axillary lymph nodes. 6. Other, non-acute and/or normal findings as above. Electronically signed by: Morgane Naveau MD  02/08/2024 03:45 AM EDT RP Workstation: [id number]   DG Chest Portable 1 View Result Date: 02/08/2024 EXAM: 1 VIEW(S) XRAY OF THE CHEST 02/08/2024 12:28:00 AM COMPARISON: 01/16/2024 CLINICAL HISTORY: sob beyond baseline, chest pain. Pt having sickle cell criss, SOB and chest pain. FINDINGS: LINES, TUBES AND DEVICES: Similar positioning of the right chest port-a-cath with loop catheter tubing at the thoracic inlet. The tip is in the low SVC. LUNGS AND PLEURA: Prominent central pulmonary arteries. Similar bilateral interstitial coarsening. Question vague ground-glass opacities in the mid lungs. Right apical pleural parenchymal scarring. No pulmonary edema. No pleural effusion. No pneumothorax. HEART AND MEDIASTINUM: Stable cardiomediastinal silhouette. BONES AND SOFT TISSUES: No acute osseous abnormality. IMPRESSION: 1. Similar interstitial coarsening with possible vague mid-lung ground-glass opacities. This may be projectional artifact, or due to edema or infection. Electronically signed by: Norman Gatlin MD 02/08/2024 12:36 AM EDT RP Workstation: [id number]    .Critical Care  Performed by: Ruthell Lonni FALCON, PA-C Authorized by: Ruthell Lonni FALCON, PA-C  Critical care provider statement:    Critical care time (minutes):  55   Critical care time was exclusive of:  Separately billable procedures and treating other patients   Critical care was necessary to treat or prevent imminent or life-threatening deterioration of the following conditions:  Respiratory failure (New PE)   Critical care was time spent personally by me on the following activities:  Blood draw for specimens, development of treatment plan with patient or surrogate, discussions with consultants, discussions with primary provider, evaluation of patient's response to treatment, examination of patient, interpretation of cardiac output measurements, obtaining history from patient or surrogate, ordering and performing treatments and  interventions, ordering and review of laboratory studies, ordering and review of radiographic studies, pulse oximetry, re-evaluation of patient's condition and review of old charts   I assumed direction of critical care for this patient from another provider in my specialty: no     Care discussed with: admitting provider      Medications Ordered in the ED  vancomycin  (VANCOCIN ) IVPB 1000 mg/200 mL premix (has no administration in time range)  ceFEPIme  (MAXIPIME ) 2 g in sodium chloride  0.9 % 100 mL IVPB (has no administration in time range)  acetaminophen  (TYLENOL ) tablet 650 mg (has no administration in time range)    Or  acetaminophen  (TYLENOL ) suppository 650 mg (has no administration in time range)  HYDROmorphone  (DILAUDID ) injection 2 mg (has no administration in time range)  naloxone  (NARCAN ) injection 0.4 mg (has no administration in time range)    And  sodium chloride  flush (NS) 0.9 % injection 9 mL (has no administration in time range)  ondansetron  (ZOFRAN ) injection 4 mg (has no administration in time range)  diphenhydrAMINE  (BENADRYL ) capsule 25 mg (has no administration in time range)  HYDROmorphone  (DILAUDID ) 1 mg/mL PCA injection (has no administration in time range)  melatonin tablet 3 mg (has no administration in time range)  HYDROmorphone  (DILAUDID ) injection 2 mg (2 mg Intravenous Given 02/08/24 0110)  oxyCODONE -acetaminophen  (PERCOCET/ROXICET) 5-325 MG per tablet 2 tablet (2 tablets Oral Given 02/08/24 0110)  diphenhydrAMINE  (BENADRYL ) injection 25 mg (25 mg Intravenous Given 02/08/24 0114)  sodium chloride  0.9 % bolus 1,000 mL (1,000 mLs Intravenous New Bag/Given 02/08/24 0114)  HYDROmorphone  (DILAUDID ) injection 2 mg (2 mg Intravenous Given 02/08/24 0220)  iohexol  (OMNIPAQUE ) 350 MG/ML injection 100 mL (100 mLs Intravenous Contrast Given 02/08/24 0300)  HYDROmorphone  (DILAUDID ) injection 2 mg (2 mg Intravenous Given 02/08/24 0345)      Medical Decision Making Amount  and/or Complexity of Data Reviewed Labs: ordered. Radiology: ordered.  Risk Prescription drug management. Decision regarding hospitalization.   33 year old male presents to ED for evaluation of sickle cell pain crisis, worsening shortness of breath.  Please see HPI for further details.  On exam, the patient is tachycardic, hypoxic.  Patient is afebrile.  Patient placed on 4 L of home oxygen , this is his home oxygen  level.  His abdomen has generalized tenderness that is nonfocal.  Neurological examination at baseline.  Assessed utilizing CBC, CMP, urinalysis, reticulocytes, troponin, PT/INR, CTA, CT abdomen pelvis, chest x-ray.  EKG also collected.  Given 2 mg Dilaudid , Benadryl , home oxycodone .  CBC without acute cytosis, baseline hemoglobin.  Metabolic panel grossly unremarkable, bilirubin 11.1.  Urinalysis with moderate hemoglobin, trace protein and trace leukocytes but patient denies dysuria.  Initial troponin 17, delta pending.  PT/INR elevated.  Chest x-ray shows concerning signs of infection, patient does endorse chest pain.  Concern of acute chest syndrome.  Patient placed on vancomycin ,  cefepime .  CTA reveals worsening known PE, possibly new left lobe PE.  Patient reports that he has been unable to afford his anticoagulation at home.  Patient admitted at this time to hospitalist, Dr. Marcene.  Patient amenable to plan.  Has received 6 mg total Dilaudid  here in the department.  Pain seems to be well-controlled.  He is eating and resting comfortably in bed.  Patient started on heparin  at this time.  Admitted to hospitalist.  Stable on admission.    Final diagnoses:  Other acute pulmonary embolism, unspecified whether acute cor pulmonale present (HCC)  Sickle cell pain crisis [facility])  Acute chest syndrome [facility])    ED Discharge Orders     None          Ruthell Lonni FALCON, PA-C 02/08/24 0435    Carita Senior, MD 02/08/24 [phone]

## 2024-02-08 NOTE — Progress Notes (Signed)
 PHARMACY - ANTICOAGULATION CONSULT NOTE  Pharmacy Consult for heparin  Indication: pulmonary embolus  Allergies  Allergen Reactions   Ketorolac  Tromethamine  Swelling and Other (See Comments)    Patient reports facial edema and left arm edema after administration.   Tape Rash and Other (See Comments)    PLEASE DO NOT USE THE CLEAR, THICK, PLASTIC TAPE- only paper tape is tolerated    Wound Dressing Adhesive Rash    Patient Measurements:    Vital Signs: Temp: 98.6 F (37 C) (10/21 0037) BP: 101/79 (10/21 0327) Pulse Rate: 98 (10/21 0327)  Labs: Recent Labs    02/08/24 0038 02/08/24 0139  HGB 8.3*  --   HCT 22.6*  --   PLT 267  --   LABPROT  --  17.1*  INR  --  1.3*  CREATININE 1.06  --     Estimated Creatinine Clearance: 88.2 mL/min (by C-G formula based on SCr of 1.06 mg/dL).   Medical History: Past Medical History:  Diagnosis Date   Anxiety    Chest pain 02/04/2024   HIV (human immunodeficiency virus infection) (HCC)    Proteinuria    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HCC)    Vitamin D  deficiency 10/2018    Medications:  Infusions:   ceFEPime  (MAXIPIME ) IV     vancomycin       Assessment: [name] with hx PE.  He was switched from Eliquis  -->Xarelto after developing new PE while on Eliquis  (01/15/24).   He reports today not being able to afford Xarelto and has not taken since being discharged from the hospital on 02/01/24. Chest CT today + increase in size of RLL PE and possible new LUL PE.  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  Baseline Hg low at 8.3 but appears to be patient's baseline; pltc WNL.     Goal of Therapy:  Heparin  level 0.3-0.7 units/ml Monitor platelets by anticoagulation protocol: Yes   Plan:  Check baseline heparin  level & aPTT  Heparin  3000 units IV bolus x1 Start heparin  infusion at 1200 units/hr Check heparin  level 6h after heparin  initiated Daily heparin  level & CBC  Rosaline Millet PharmD 02/08/2024,4:34 AM

## 2024-02-08 NOTE — ED Notes (Signed)
 Writer tried to [employer] through a TEFL teacher, Energy manager unsuccessful. IV Ultrasound contacted to place an IV.

## 2024-02-08 NOTE — Progress Notes (Signed)
 PHARMACY - ANTICOAGULATION CONSULT NOTE  Pharmacy Consult for heparin  Indication: pulmonary embolus  Allergies  Allergen Reactions   Ketorolac  Tromethamine  Swelling and Other (See Comments)    Patient reports facial edema and left arm edema after administration.   Tape Rash and Other (See Comments)    PLEASE DO NOT USE THE CLEAR, THICK, PLASTIC TAPE- only paper tape is tolerated    Wound Dressing Adhesive Rash    Patient Measurements:   Weight 63.5 kg, Ht 75  Vital Signs: Temp: 97.6 F (36.4 C) (10/21 1020) Temp Source: Oral (10/21 1020) BP: 109/69 (10/21 1100) Pulse Rate: 103 (10/21 1100)  Labs: Recent Labs    02/08/24 0038 02/08/24 0139 02/08/24 0520 02/08/24 0802 02/08/24 1130  HGB 8.3*  --   --  7.9*  --   HCT 22.6*  --   --  22.0*  --   PLT 267  --   --  259  --   APTT  --   --  38*  --   --   LABPROT  --  17.1*  --   --   --   INR  --  1.3*  --   --   --   HEPARINUNFRC  --   --  <0.10*  --  >1.10*  CREATININE 1.06  --   --   --   --     Estimated Creatinine Clearance: 88.2 mL/min (by C-G formula based on SCr of 1.06 mg/dL).   Medical History: Past Medical History:  Diagnosis Date   Anxiety    Chest pain 02/04/2024   HIV (human immunodeficiency virus infection) (HCC)    Proteinuria    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HCC)    Vitamin D  deficiency 10/2018    Medications:  Infusions:   heparin  1,200 Units/hr (02/08/24 0517)    Assessment: [name] with hx PE.  He was switched from Eliquis  to Xarelto after developing new PE while on Eliquis  (01/15/24) with intermittent compliance.  He reports not being able to afford Xarelto and has not taken since being discharged from the hospital on 02/01/24. Chest CT (10/21) shows increased size of RLL PE and possible new LUL PE.  Chronic RHF, but no worsened acute RHS.  Baseline Hgb low at 8.3 but appears to be near baseline; pltc WNL.  Baseline coags: INR 1.3, aPTT 38, HL <0.1   Today, 02/08/2024: Heparin  level > 1.1,  supratherapeutic on heparin  1200 units/hr (~19 units/kg/hr) - Labs drawn from port where heparin  is infusing. - Heparin  re-draw resulted < 0.1, subtherapeutic CBC: Hgb low and decreased to 7.9, Plt WNL RN reports no bleeding or any IV complications.  No IV interruptions or pauses.     Goal of Therapy:  Heparin  level 0.3-0.7 units/ml Monitor platelets by anticoagulation protocol: Yes   Plan:  Give heparin  2000 units bolus IV x 1 Increase to heparin  IV infusion at 1450 units/hr Heparin  level 6 hours after starting Daily heparin  level and CBC  Wanda Hasting PharmD, BCPS [facility] main pharmacy [phone] 02/08/2024 12:30 PM

## 2024-02-08 NOTE — Progress Notes (Signed)
 PHARMACY - ANTICOAGULATION CONSULT NOTE  Pharmacy Consult for heparin  Indication: pulmonary embolus  Allergies  Allergen Reactions   Ketorolac  Tromethamine  Swelling and Other (See Comments)    Patient reports facial edema and left arm edema after administration.   Tape Rash and Other (See Comments)    PLEASE DO NOT USE THE CLEAR, THICK, PLASTIC TAPE- only paper tape is tolerated    Wound Dressing Adhesive Rash    Patient Measurements: Height: 6' 3 (190.5 cm) Weight: 63.3 kg (139 lb 8.1 oz) IBW/kg (Calculated) : 84.5 HEPARIN  DW (KG): 63.3 Last Weight  Most recent update: 02/08/2024  4:30 PM    Weight  63.3 kg (139 lb 8.1 oz)           Weight 63.5 kg, Ht 75  Vital Signs: Temp: 98.7 F (37.1 C) (10/21 2203) Temp Source: Oral (10/21 2203) BP: 123/89 (10/21 2203) Pulse Rate: 131 (10/21 2203)  Labs: Recent Labs    02/08/24 0038 02/08/24 0139 02/08/24 0520 02/08/24 0520 02/08/24 0802 02/08/24 1130 02/08/24 1314 02/08/24 2142  HGB 8.3*  --   --   --  7.9*  --   --   --   HCT 22.6*  --   --   --  22.0*  --   --   --   PLT 267  --   --   --  259  --   --   --   APTT  --   --  38*  --   --   --   --   --   LABPROT  --  17.1*  --   --   --   --   --   --   INR  --  1.3*  --   --   --   --   --   --   HEPARINUNFRC  --   --  <0.10*   < >  --  >1.10* <0.10* 0.23*  CREATININE 1.06  --   --   --   --   --   --   --    < > = values in this interval not displayed.    Estimated Creatinine Clearance: 87.9 mL/min (by C-G formula based on SCr of 1.06 mg/dL).   Medical History: Past Medical History:  Diagnosis Date   Anxiety    Chest pain 02/04/2024   HIV (human immunodeficiency virus infection) (HCC)    Proteinuria    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HCC)    Vitamin D  deficiency 10/2018    Medications:  Infusions:   heparin  1,450 Units/hr (02/08/24 1512)    Assessment: [name] with hx PE.  He was switched from Eliquis  to Xarelto after developing new PE while on Eliquis   (01/15/24) with intermittent compliance.  He reports not being able to afford Xarelto and has not taken since being discharged from the hospital on 02/01/24. Chest CT (10/21) shows increased size of RLL PE and possible new LUL PE.  Chronic RHF, but no worsened acute RHS.  Baseline Hgb low at 8.3 but appears to be near baseline; pltc WNL.  Baseline coags: INR 1.3, aPTT 38, HL <0.1   Today, 02/08/2024: 2142 Heparin  level 0.23; remains sub-therapeutic on heparin  1450 units/hr (~23 units/kg/hr) - Verified that heparin  infusing via port & Labs drawn peripherally  CBC: Hgb low and decreased to 7.9, Plt WNL RN reports no bleeding or IV interruptions/pauses     Goal of Therapy:  Heparin  level 0.3-0.7 units/ml Monitor  platelets by anticoagulation protocol: Yes   Plan:  Give heparin  2000 units bolus IV x 1 Increase to heparin  IV infusion to 1700 units/hr Heparin  level 6 hours after rate increase Daily heparin  level and CBC while on heparin   Rosaline Millet, PharmD, BCPS 02/08/2024 10:21 PM

## 2024-02-08 NOTE — Progress Notes (Signed)
 Rivaroxaban  starter kit and 1 month supply sent to [facility] in [place] 

## 2024-02-08 NOTE — ED Notes (Signed)
 Per doctor pt was sat up in bed and oxygen  increased to 6 liters per min.  Pt advised it is helping.

## 2024-02-08 NOTE — Progress Notes (Signed)
  Carryover admission to the Day Admitter.  I discussed this case with the EDP, Lonni Lites, PA.  Per these discussions:   This is a 33 year old male with history of sickle cell disease, pulmonary embolism, who is being admitted with acute/worsening pulmonary embolism as well as concern for acute sickle cell pain crisis after presenting with 3 days of worsening shortness of breath, worse with exertion as well as some chest discomfort, generalized abdominal discomfort.   He has a history of recurrent pulmonary embolism, which prompted hospitalization on 01/15/2024.  This was felt to be a failure of existing Eliquis , prompting transition to Xarelto.  However, the patient essentially been unable to start Xarelto due to issues with his insurance, and presents back to the emergency department this evening complaining of 3 days of worsening shortness of breath.  He has a history of sickle cell disease, with prior hospitalizations for acute sickle cell pain crisis, and notes that once of prior sickle cell pain crisis, they typically requires her around 4 L nasal cannula.  Vital signs in the ED today notable for afebrile; initial heart rates in the low 100s, essentially decreasing in the 90s; systolic pressures in the low 899d mmHg.   CBC notable for hemoglobin of 8.3, relative to most recent prior value of 8.9 on 01/31/2024 as well as 8.9 on 01/28/2024, and preceded by 7.0 on 01/21/2024.  Initial troponin 17, with repeat troponin currently pending.  CTA chest today interval increase in previously noted pulmonary embolism as well as suggestion of new acute pulmonary embolism involving the left upper lobe.   EKG without any reported acute ischemic changes.  Started on heparin  drip in the ED this evening.  The patient notes that her last pain is without significant improvement following 8 mg of IV Dilaudid  in the emergency department this evening.  I have placed an order for observation for further  evaluation management of the above.  I have placed some additional preliminary admit orders via the adult multi-morbid admission order set. I have also ordered continuation of existing heparin  drip.  Regarding concern for the patient's will cell pain crisis, I have ordered Dilaudid  2 mg IV every 30 minutes as needed, and included pharmacy/nursing instructions to please discontinue this order for Dilaudid  once Dilaudid  PCA has been initiated.  I have correspondingly placed orders for Dilaudid  PCA with bolus dose of 0.3 mg IV every 10 minutes with 1 hour lockout of 4 mg also ordered continuous pulse oximetry, end-tidal CO2 monitoring, incentive spirometry.  If ordered type and screen as well as magnesium  level, in order to repeat CBC to be checked around 8 AM.     Eva Pore, DO Hospitalist

## 2024-02-09 ENCOUNTER — Inpatient Hospital Stay (HOSPITAL_COMMUNITY): Payer: Self-pay

## 2024-02-09 ENCOUNTER — Other Ambulatory Visit (HOSPITAL_COMMUNITY): Payer: Self-pay

## 2024-02-09 ENCOUNTER — Other Ambulatory Visit (HOSPITAL_BASED_OUTPATIENT_CLINIC_OR_DEPARTMENT_OTHER): Payer: Self-pay

## 2024-02-09 ENCOUNTER — Other Ambulatory Visit: Payer: Self-pay | Admitting: Nurse Practitioner

## 2024-02-09 DIAGNOSIS — R079 Chest pain, unspecified: Secondary | ICD-10-CM

## 2024-02-09 LAB — TOXASSURE FLEX 15, UR
7-aminoclonazepam: NOT DETECTED ng/mg{creat}
Alpha-hydroxyalprazolam: NOT DETECTED ng/mg{creat}
Alpha-hydroxymidazolam: NOT DETECTED ng/mg{creat}
Alpha-hydroxytriazolam: NOT DETECTED ng/mg{creat}
Alprazolam: NOT DETECTED ng/mg{creat}
Buprenorphine: NOT DETECTED ng/mg{creat}
Clonazepam: NOT DETECTED ng/mg{creat}
Creatinine: 183 mg/dL (ref >=20)
Desalkylflurazepam: NOT DETECTED ng/mg{creat}
Desmethyldiazepam: NOT DETECTED ng/mg{creat}
Desmethylflunitrazepam: NOT DETECTED ng/mg{creat}
Diazepam: NOT DETECTED ng/mg{creat}
Fentanyl: NOT DETECTED ng/mg{creat}
Flunitrazepam: NOT DETECTED ng/mg{creat}
Lorazepam: NOT DETECTED ng/mg{creat}
Midazolam: NOT DETECTED ng/mg{creat}
Norbuprenorphine: NOT DETECTED ng/mg{creat}
Norfentanyl: NOT DETECTED ng/mg{creat}
Oxazepam: NOT DETECTED ng/mg{creat}
Temazepam: NOT DETECTED ng/mg{creat}

## 2024-02-09 LAB — OXYCODONE CLASS, MS, UR RFX
Noroxycodone: 4838 ng/mg{creat}
Noroxymorphone: 2204 ng/mg{creat}
Oxycodone Class Confirmation: POSITIVE — AB
Oxycodone: 1354 ng/mg{creat}
Oxymorphone: 3048 ng/mg{creat}

## 2024-02-09 LAB — CBC
HCT: 22.6 % — ABNORMAL LOW (ref 39.0–52.0)
Hemoglobin: 7.9 g/dL — ABNORMAL LOW (ref 13.0–17.0)
MCH: 35.9 pg — ABNORMAL HIGH (ref 26.0–34.0)
MCHC: 35 g/dL (ref 30.0–36.0)
MCV: 102.7 fL — ABNORMAL HIGH (ref 80.0–100.0)
Platelets: 224 K/uL (ref 150–400)
RBC: 2.2 MIL/uL — ABNORMAL LOW (ref 4.22–5.81)
RDW: 26.3 % — ABNORMAL HIGH (ref 11.5–15.5)
WBC: 13.1 K/uL — ABNORMAL HIGH (ref 4.0–10.5)
nRBC: 33.4 % — ABNORMAL HIGH (ref 0.0–0.2)

## 2024-02-09 LAB — COMPREHENSIVE METABOLIC PANEL WITH GFR
ALT: 55 U/L — ABNORMAL HIGH (ref 0–44)
AST: 98 U/L — ABNORMAL HIGH (ref 15–41)
Albumin: 4 g/dL (ref 3.5–5.0)
Alkaline Phosphatase: 62 U/L (ref 38–126)
Anion gap: 12 (ref 5–15)
BUN: 28 mg/dL — ABNORMAL HIGH (ref 6–20)
CO2: 20 mmol/L — ABNORMAL LOW (ref 22–32)
Calcium: 8.1 mg/dL — ABNORMAL LOW (ref 8.9–10.3)
Chloride: 103 mmol/L (ref 98–111)
Creatinine, Ser: 2.19 mg/dL — ABNORMAL HIGH (ref 0.61–1.24)
GFR, Estimated: 40 mL/min — ABNORMAL LOW (ref >60)
Glucose, Bld: 107 mg/dL — ABNORMAL HIGH (ref 70–99)
Potassium: 5.8 mmol/L — ABNORMAL HIGH (ref 3.5–5.1)
Sodium: 135 mmol/L (ref 135–145)
Total Bilirubin: 12.6 mg/dL — ABNORMAL HIGH (ref 0.0–1.2)
Total Protein: 7.6 g/dL (ref 6.5–8.1)

## 2024-02-09 LAB — ECHOCARDIOGRAM COMPLETE
Area-P 1/2: 3.99 cm2
Calc EF: 72.9 %
Height: 75 in
MV M vel: 4.57 m/s
MV Peak grad: 83.5 mmHg
Radius: 0.4 cm
S' Lateral: 2.6 cm
Single Plane A2C EF: 73.1 %
Single Plane A4C EF: 73.7 %
Weight: 2232.11 [oz_av]

## 2024-02-09 LAB — BASIC METABOLIC PANEL WITH GFR
Anion gap: 11 (ref 5–15)
BUN: 27 mg/dL — ABNORMAL HIGH (ref 6–20)
CO2: 22 mmol/L (ref 22–32)
Calcium: 8.2 mg/dL — ABNORMAL LOW (ref 8.9–10.3)
Chloride: 103 mmol/L (ref 98–111)
Creatinine, Ser: 1.74 mg/dL — ABNORMAL HIGH (ref 0.61–1.24)
GFR, Estimated: 52 mL/min — ABNORMAL LOW (ref >60)
Glucose, Bld: 107 mg/dL — ABNORMAL HIGH (ref 70–99)
Potassium: 4.6 mmol/L (ref 3.5–5.1)
Sodium: 135 mmol/L (ref 135–145)

## 2024-02-09 LAB — CANNABINOIDS, MS, UR RFX
Cannabinoids Confirmation: POSITIVE — AB
Carboxy-THC: 13 ng/mg{creat}

## 2024-02-09 LAB — LACTATE DEHYDROGENASE: LDH: 1020 U/L — ABNORMAL HIGH (ref 98–192)

## 2024-02-09 LAB — OPIATE CLASS, MS, UR RFX
Codeine: NOT DETECTED ng/mg{creat}
Dihydrocodeine: NOT DETECTED ng/mg{creat}
Hydrocodone: NOT DETECTED ng/mg{creat}
Hydromorphone: 192 ng/mg{creat}
Morphine: NOT DETECTED ng/mg{creat}
Norcodeine: NOT DETECTED ng/mg{creat}
Norhydrocodone: NOT DETECTED ng/mg{creat}
Normorphine: NOT DETECTED ng/mg{creat}
Opiate Class Confirmation: POSITIVE — AB

## 2024-02-09 LAB — HEPARIN LEVEL (UNFRACTIONATED)
Heparin Unfractionated: 0.31 [IU]/mL (ref 0.30–0.70)
Heparin Unfractionated: >1.1 [IU]/mL — ABNORMAL HIGH (ref 0.30–0.70)
Heparin Unfractionated: 0.53 [IU]/mL (ref 0.30–0.70)

## 2024-02-09 LAB — FERRITIN: Ferritin: 2279 ng/mL — ABNORMAL HIGH (ref 30–400)

## 2024-02-09 LAB — VITAMIN D 25 HYDROXY (VIT D DEFICIENCY, FRACTURES): Vit D, 25-Hydroxy: 6.5 ng/mL — ABNORMAL LOW (ref 30.0–100.0)

## 2024-02-09 MED ORDER — DABIGATRAN ETEXILATE MESYLATE 150 MG PO CAPS: 150.0000 mg | ORAL_CAPSULE | Freq: Two times a day (BID) | ORAL | 1 refills | Status: DC

## 2024-02-09 MED ORDER — FUROSEMIDE 20 MG PO TABS
20.0000 mg | ORAL_TABLET | Freq: Once | ORAL | Status: AC
  Administered 2024-02-09: 20 mg via ORAL
  Filled 2024-02-09: qty 1

## 2024-02-09 MED ORDER — CARMEX CLASSIC LIP BALM EX OINT
1.0000 | TOPICAL_OINTMENT | CUTANEOUS | Status: DC | PRN
  Filled 2024-02-09: qty 10

## 2024-02-09 MED ORDER — DABIGATRAN ETEXILATE MESYLATE 150 MG PO CAPS
150.0000 mg | ORAL_CAPSULE | Freq: Two times a day (BID) | ORAL | 1 refills | Status: DC
  Filled 2024-02-09: qty 60, 30d supply, fill #0

## 2024-02-09 MED ORDER — FUROSEMIDE 10 MG/ML IJ SOLN
10.0000 mg | Freq: Once | INTRAMUSCULAR | Status: AC
Start: 2024-02-09 — End: 2024-02-09
  Administered 2024-02-09: 10 mg via INTRAVENOUS
  Filled 2024-02-09: qty 2

## 2024-02-09 MED ORDER — RIVAROXABAN (XARELTO) VTE STARTER PACK (15 & 20 MG)
ORAL_TABLET | ORAL | 0 refills | Status: DC
  Filled 2024-02-09: qty 51, 30d supply, fill #0

## 2024-02-09 NOTE — TOC Initial Note (Signed)
 Transition of Care [facility]) - Initial/Assessment Note    Patient Details  Name: Martin [name] MRN: [id number] Date of Birth: 25-Apr-1990  Transition of Care [facility]) CM/SW Contact:    Toy LITTIE Agar, RN Phone [phone]  02/09/2024, 3:57 PM  Clinical Narrative:                 Inpatient care manager following patient with high risk for readmission. Patient is from home where he is currently homeless but able to stay with different family members at times. Patient is independent at home but does have home O2 and walker. Patient currently has no active insurance. MATCH form has been completed and placed on chart. Pharmacy aware. Patient does have a PCP and is currently active. Inpatient care manager will continue to follow.   Expected Discharge Plan: Home/Self Care Barriers to Discharge: Continued Medical Work up   Patient Goals and CMS Choice Patient states their goals for this hospitalization and ongoing recovery are:: Pain to get better and to be able to get home meds CMS Medicare.gov Compare Post Acute Care list provided to::  (n/a) Choice offered to / list presented to : Patient [place] ownership interest in [facility].provided to::  (n/a)    Expected Discharge Plan and Services In-house Referral: NA Discharge Planning Services: CM Consult, MATCH Program, Medication Assistance Post Acute Care Choice: NA Living arrangements for the past 2 months: Homeless (sometimes can stay with family and friends)                 DME Arranged: N/A DME Agency: NA       HH Arranged: NA HH Agency: NA        Prior Living Arrangements/Services Living arrangements for the past 2 months: Homeless (sometimes can stay with family and friends) Lives with:: Other (Comment) (homeless and can sometimes stay with family and friends at times) Patient language and need for interpreter reviewed:: Yes Do you feel safe going back to the place where you live?: Yes      Need for  Family Participation in Patient Care: No (Comment) Care giver support system in place?: Yes (comment) Current home services: DME (Home O2 and walker) Criminal Activity/Legal Involvement Pertinent to Current Situation/Hospitalization: No - Comment as needed  Activities of Daily Living   ADL Screening (condition at time of admission) Independently performs ADLs?: Yes (appropriate for developmental age) Is the patient deaf or have difficulty hearing?: No Does the patient have difficulty seeing, even when wearing glasses/contacts?: No Does the patient have difficulty concentrating, remembering, or making decisions?: No  Permission Sought/Granted Permission sought to share information with : Family Supports Permission granted to share information with : No              Emotional Assessment Appearance:: Appears stated age Attitude/Demeanor/Rapport: Gracious Affect (typically observed): Quiet, Pleasant Orientation: : Oriented to Self, Oriented to Place, Oriented to  Time, Oriented to Situation Alcohol  / Substance Use: Not Applicable Psych Involvement: No (comment)  Admission diagnosis:  Acute pulmonary embolism (HCC) [I26.99] Sickle cell pain crisis (HCC) [D57.00] Acute chest syndrome (HCC) [D57.01] Other acute pulmonary embolism, unspecified whether acute cor pulmonale present (HCC) [I26.99] Patient Active Problem List   Diagnosis Date Noted   Sickle cell pain crisis (HCC) 02/08/2024   Right flank pain 02/04/2024   Elevated brain natriuretic peptide (BNP) level 02/04/2024   Chest pain 02/04/2024   Acute cystitis with hematuria 02/04/2024   Muscle spasm 02/04/2024   Decrease in appetite 02/04/2024  Acute on chronic hypoxic respiratory failure (HCC) 01/16/2024   Chronic anticoagulation 12/18/2023   Sickle cell crisis (HCC) 10/12/2023   Chronic hypoxic respiratory failure (HCC) 08/04/2023   Iron overload, 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   Influenza A with pneumonia 06/20/2023   Acute  hypoxemic respiratory failure (HCC) 06/20/2023   AKI (acute kidney injury) 06/20/2023   Influenza A with respiratory manifestations 06/20/2023   Acute on chronic anemia 03/16/2023   History of HIV infection (HCC) 03/16/2023   Hypokalemia 03/16/2023   Anemia of chronic disease 07/13/2022   Hyperbilirubinemia 07/13/2022   Tinea capitis 07/29/2021   Bacteremia due to Enterococcus 08/29/2020   Acute chest syndrome (HCC) 08/26/2020   Hypoxia 07/09/2020   COVID-19 05/13/2020   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/18/2020   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   Abnormal penile discharge, without blood 02/22/2020   Leukocytosis 01/02/2020   Anxiety 11/27/2019   Proteinuria 11/27/2019   Acute on chronic respiratory failure with hypoxia (HCC) 11/16/2019   Chronic, continuous use of opioids 08/15/2019   Seasonal allergies 08/15/2019   Chest congestion    Chronic pain syndrome 08/04/2019   History of pulmonary embolism 08/04/2019   Acute pulmonary embolism (HCC) 02/10/2019   Acute chest syndrome due to sickle cell crisis (HCC) 02/10/2019   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) 03/09/2018   Bone pain 03/07/2018   Hip pain 03/07/2018   Acute bronchitis due to Streptococcus 02/21/2018   Heart murmur 02/03/2017   Sickle cell disease with crisis (HCC) 02/02/2017   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   Vitamin D  deficiency 08/14/2016   High risk medication use 09/27/2014   Generalized anxiety disorder 05/19/2014   GERD (gastroesophageal reflux disease) 05/19/2014   Marijuana use 11/07/2012   HIV (human immunodeficiency virus infection) (HCC) 05/23/2012   PCP:  [name], [name], [name] Pharmacy:   [facility] [phone] - [place], [place] - [address] AT [facility] [address] [place] [address] Phone: [phone] Fax: [phone]  [facility]  Pharmacy [name] [place] [address] Phone: [phone] Fax: [phone]     Social Drivers of Health (SDOH) Social History: SDOH Screenings   Food Insecurity: No Food Insecurity (02/08/2024)  Housing: High Risk (02/08/2024)  Transportation Needs: No Transportation Needs (02/08/2024)  Utilities: Not At Risk (02/08/2024)  Depression (PHQ2-9): Low Risk  (02/04/2024)  Financial Resource Strain: Low Risk  (10/09/2023)   Received from [facility]  Recent Concern: Financial Resource Strain - Medium Risk (08/03/2023)  Physical Activity: Sufficiently Active (10/09/2023)   [facility]  Social Connections: Socially Isolated (01/04/2024)  Stress: No Stress Concern Present (10/09/2023)   [facility] from [facility]  Tobacco Use: Low Risk  (02/08/2024)   SDOH Interventions:     Readmission Risk Interventions    02/09/2024    3:42 PM 01/06/2024   12:15 PM 12/22/2023    4:14 PM  Readmission Risk Prevention Plan  Transportation Screening Complete Complete Complete  Medication Review Oceanographer) Complete Complete Complete  PCP or Specialist appointment within 3-5 days of discharge Complete Complete Complete  HRI or Home Care Consult Complete Complete Complete  SW Recovery Care/Counseling Consult Complete Complete Complete  Palliative Care Screening Not Applicable Not Applicable Not Applicable  Skilled Nursing Facility Not Applicable Not Applicable Not Applicable

## 2024-02-09 NOTE — Progress Notes (Signed)
  Echocardiogram 2D Echocardiogram has been performed.  Martin [name] 02/09/2024, 2:36 PM

## 2024-02-09 NOTE — Progress Notes (Signed)
 MEWS Progress Note  Patient Details Name: Martin [name] MRN: [id number] DOB: 1990/12/06 Today's Date: 02/09/2024   MEWS Flowsheet Documentation:  Assess: MEWS Score Temp: 97.9 F (36.6 C) BP: (!) 99/59 MAP (mmHg): 71 Pulse Rate: (!) 102 ECG Heart Rate: 95 Resp: 14 Level of Consciousness: Alert SpO2: 97 % O2 Device: High Flow Nasal Cannula Patient Activity (if Appropriate): In bed O2 Flow Rate (L/min): 7 L/min FiO2 (%): 52 % Assess: MEWS Score MEWS Temp: 0 MEWS Systolic: 1 MEWS Pulse: 1 MEWS RR: 0 MEWS LOC: 0 MEWS Score: 2 MEWS Score Color: Yellow Assess: SIRS CRITERIA SIRS Temperature : 0 SIRS Respirations : 0 SIRS Pulse: 1 SIRS WBC: 0 SIRS Score Sum : 1 SIRS Temperature : 0 SIRS Pulse: 1 SIRS Respirations : 0 SIRS WBC: 1 SIRS Score Sum : 2 Assess: if the MEWS score is Yellow or Red Were vital signs accurate and taken at a resting state?: Yes Does the patient meet 2 or more of the SIRS criteria?: Yes Does the patient have a confirmed or suspected source of infection?: Yes MEWS guidelines implemented : Yes, yellow Treat MEWS Interventions: Considered administering scheduled or prn medications/treatments as ordered Take Vital Signs Increase Vital Sign Frequency : Yellow: Q2hr x1, continue Q4hrs until patient remains green for 12hrs Escalate MEWS: Escalate: Yellow: Discuss with charge nurse and consider notifying provider and/or RRT        Granvil Djordjevic E April Colter 02/09/2024, 2:47 PM

## 2024-02-09 NOTE — Progress Notes (Signed)
 PHARMACY - ANTICOAGULATION CONSULT NOTE  Pharmacy Consult for heparin  Indication: pulmonary embolus  Allergies  Allergen Reactions   Ketorolac  Tromethamine  Swelling and Other (See Comments)    Patient reports facial edema and left arm edema after administration.   Tape Rash and Other (See Comments)    PLEASE DO NOT USE THE CLEAR, THICK, PLASTIC TAPE- only paper tape is tolerated    Wound Dressing Adhesive Rash    Patient Measurements: Height: 6' 3 (190.5 cm) Weight: 63.3 kg (139 lb 8.1 oz) IBW/kg (Calculated) : 84.5 HEPARIN  DW (KG): 63.3 Last Weight  Most recent update: 02/08/2024  4:30 PM    Weight  63.3 kg (139 lb 8.1 oz)           Weight 63.5 kg, Ht 75  Vital Signs: Temp: 97.7 F (36.5 C) (10/22 1037) Temp Source: Oral (10/22 1037) BP: 114/78 (10/22 1037) Pulse Rate: 98 (10/22 1037)  Labs: Recent Labs    02/08/24 0038 02/08/24 0139 02/08/24 0520 02/08/24 0802 02/08/24 1130 02/08/24 1314 02/08/24 2142 02/09/24 0638  HGB 8.3*  --   --  7.9*  --   --   --  7.9*  HCT 22.6*  --   --  22.0*  --   --   --  22.6*  PLT 267  --   --  259  --   --   --  224  APTT  --   --  38*  --   --   --   --   --   LABPROT  --  17.1*  --   --   --   --   --   --   INR  --  1.3*  --   --   --   --   --   --   HEPARINUNFRC  --   --  <0.10*  --    < > <0.10* 0.23* 0.31  CREATININE 1.06  --   --   --   --   --   --   --    < > = values in this interval not displayed.    Estimated Creatinine Clearance: 87.9 mL/min (by C-G formula based on SCr of 1.06 mg/dL).   Medical History: Past Medical History:  Diagnosis Date   Anxiety    Chest pain 02/04/2024   HIV (human immunodeficiency virus infection) (HCC)    Proteinuria    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HCC)    Vitamin D  deficiency 10/2018    Medications:  Infusions:   heparin  1,850 Units/hr (02/09/24 9047)    Assessment: [name] with hx PE.  He was switched from Eliquis  to Xarelto after developing new PE while on Eliquis   (01/15/24) with intermittent compliance.  He reports not being able to afford Xarelto and has not taken since being discharged from the hospital on 02/01/24. Chest CT (10/21) shows increased size of RLL PE and possible new LUL PE.  Chronic RHF, but no worsened acute RHS.  Baseline Hgb low at 8.3 but appears to be near baseline; pltc WNL.  Baseline coags: INR 1.3, aPTT 38, HL <0.1   Today, 02/09/2024: HL this AM is 0.31, just therapeutic  - Verified that heparin  infusing via port & Labs drawn peripherally  CBC: Hgb low and decreased to 7.9, Plt WNL RN reports no bleeding or IV interruptions/pauses     Goal of Therapy:  Heparin  level 0.3-0.7 units/ml Monitor platelets by anticoagulation protocol: Yes   Plan:  Increase  to heparin  IV infusion to 1850 units/hr Heparin  level 6 hours after rate increase Daily heparin  level and CBC while on heparin     Martin [name], PharmD, BCPS 02/09/2024 10:39 AM

## 2024-02-09 NOTE — Progress Notes (Signed)
 MATCH MEDICATION ASSISTANCE CARD Pharmacies please call [phone] for claim processing assistance.  Rx BIN: [id number] Rx Group: [id number] Rx PCN: [id number] Relationship Code: 1 Person Code: [id number]  Patient ID (MRN): [id number]    Patient Name: Martin [name]   Patient DOB: 1991-02-25   Discharge Date:02/09/2024  Expiration Date: 02/17/2024 (must be filled within 7 days of discharge)   Dear Rosine have been approved to have the prescriptions written by your discharging physician filled through our [facility] (Medication Assistance Through [facility]) program. This program allows for a one-time (no refills) 34-day supply of selected medications for a low copay amount.  The copay is $3.00 per prescription. For instance, if you have one prescription, you will pay $3.00; for two prescriptions, you pay $6.00; for three prescriptions, you pay $9.00; and so on. Only certain pharmacies are participating in this program with [facility]. You will need to select one of the pharmacies from the attached lists and take your prescriptions, this letter, and your photo ID to one of the participating pharmacies.  We are excited that you are able to use the [facility] program to get your medications. These prescriptions must be filled within 7 days of hospital discharge or they will no longer be valid for the [facility] program. Should you have any problems with your prescriptions please contact your case management team member at [phone] for [facility] or [phone] for [facility].  Thank you,  [facility]

## 2024-02-09 NOTE — Progress Notes (Signed)
   02/09/24 0112  Assess: MEWS Score  Temp (!) 101.3 F (38.5 C)  BP 102/72  MAP (mmHg) 83  Pulse Rate (!) 117  Resp 20  SpO2 92 %  O2 Device HFNC  O2 Flow Rate (L/min) 8 L/min  Assess: MEWS Score  MEWS Temp 1  MEWS Systolic 0  MEWS Pulse 2  MEWS RR 0  MEWS LOC 0  MEWS Score 3  MEWS Score Color Yellow  Assess: if the MEWS score is Yellow or Red  Were vital signs accurate and taken at a resting state? Yes  Does the patient meet 2 or more of the SIRS criteria? Yes  Does the patient have a confirmed or suspected source of infection? Yes  MEWS guidelines implemented  No, previously red, continue vital signs every 4 hours  Provider Notification  Provider Name/Title Blondie, NP  Date Provider Notified 02/09/24  Time Provider Notified 0112  Method of Notification Page (securechat)  Notification Reason Other (Comment) (T 101.3 sats 80's on 6 LPM)  Provider response En route  Date of Provider Response 02/09/24  Time of Provider Response 0112  Notify: Rapid Response  Name of Rapid Response RN Notified Genelle, RN  Date Rapid Response Notified 02/09/24  Time Rapid Response Notified 0104  Assess: SIRS CRITERIA  SIRS Temperature  1  SIRS Respirations  0  SIRS Pulse 1  SIRS WBC 0  SIRS Score Sum  2

## 2024-02-09 NOTE — Progress Notes (Addendum)
 PHARMACY - ANTICOAGULATION CONSULT NOTE  Pharmacy Consult for heparin  Indication: pulmonary embolus  Allergies  Allergen Reactions   Ketorolac  Tromethamine  Swelling and Other (See Comments)    Patient reports facial edema and left arm edema after administration.   Tape Rash and Other (See Comments)    PLEASE DO NOT USE THE CLEAR, THICK, PLASTIC TAPE- only paper tape is tolerated    Wound Dressing Adhesive Rash    Patient Measurements: Height: 6' 3 (190.5 cm) Weight: 63.3 kg (139 lb 8.1 oz) IBW/kg (Calculated) : 84.5 HEPARIN  DW (KG): 63.3 Last Weight  Most recent update: 02/08/2024  4:30 PM    Weight  63.3 kg (139 lb 8.1 oz)           Weight 63.5 kg, Ht 75  Vital Signs: Temp: 98 F (36.7 C) (10/22 2020) Temp Source: Oral (10/22 2020) BP: 115/69 (10/22 2020) Pulse Rate: 94 (10/22 2020)  Labs: Recent Labs    02/08/24 0038 02/08/24 0139 02/08/24 0520 02/08/24 0802 02/08/24 1130 02/09/24 0638 02/09/24 1300 02/09/24 1828 02/09/24 2039  HGB 8.3*  --   --  7.9*  --  7.9*  --   --   --   HCT 22.6*  --   --  22.0*  --  22.6*  --   --   --   PLT 267  --   --  259  --  224  --   --   --   APTT  --   --  38*  --   --   --   --   --   --   LABPROT  --  17.1*  --   --   --   --   --   --   --   INR  --  1.3*  --   --   --   --   --   --   --   HEPARINUNFRC  --   --  <0.10*  --    < > 0.31  --  >1.10* 0.53  CREATININE 1.06  --   --   --   --   --  2.19*  --   --    < > = values in this interval not displayed.    Estimated Creatinine Clearance: 42.6 mL/min (A) (by C-G formula based on SCr of 2.19 mg/dL (H)).   Medical History: Past Medical History:  Diagnosis Date   Anxiety    Chest pain 02/04/2024   HIV (human immunodeficiency virus infection) (HCC)    Proteinuria    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HCC)    Vitamin D  deficiency 10/2018    Medications:  Infusions:   heparin  1,850 Units/hr (02/09/24 1207)    Assessment: [name] with hx PE.  He was switched from  Eliquis  to Xarelto after developing new PE while on Eliquis  (01/15/24) with intermittent compliance.  He reports not being able to afford Xarelto and has not taken since being discharged from the hospital on 02/01/24. Chest CT (10/21) shows increased size of RLL PE and possible new LUL PE.  Chronic RHF, but no worsened acute RHS.  Baseline Hgb low at 8.3 but appears to be near baseline; pltc WNL.  Baseline coags: INR 1.3, aPTT 38, HL <0.1  Today, 02/09/2024: Repeat heparin  level 0.53- therapeutic on IV heparin  1850 units/hr - Verified that heparin  infusing peripherally & repeat labs drawn peripherally.  Initial level >1.1 was drawn from port which is assumed to have  still been contaminated with heparin . CBC: Hgb low/stable at 7.9, Plt WNL RN reports no bleeding or IV interruptions/pauses     Goal of Therapy:  Heparin  level 0.3-0.7 units/ml Monitor platelets by anticoagulation protocol: Yes   Plan:  Continue heparin  IV infusion at 1850 units/hr Daily heparin  level and CBC while on heparin  Noted plans to bridge to Pradaxa after 5 days of heparin .  Consider switch to Lovenox ?  Rosaline Millet, PharmD, BCPS 02/09/2024 9:20 PM

## 2024-02-09 NOTE — Progress Notes (Signed)
 Patient called out stating heparin  running to PIV was beeping, stating occluded. This RN to bedside to assess. This RN flushed both PIV's, and both were occluded. Dilaudid  PCA paused, heparin  gtt connected to port, tubing changed. IV consult placed.

## 2024-02-09 NOTE — Progress Notes (Addendum)
       Overnight   NAME: Martin [name] MRN: [id number] DOB : 06-29-1990    Date of Service   02/09/2024   HPI/Events of Note    Notified by RN/RR RN of increased use of oxygen .  Patient requiring 6 to 8 L via nasal cannula with sats hovering around 90%.  Brief history 33 year old male with history of sickle cell disease, history of acute chest syndrome, multiple sickle cell crises, multiple ER visits and hospital admissions, history of PE currently on Eliquis , chronic hypoxic respiratory failure on home O2, HIV infection, GAD.  Admitted through the ER for continued PE, sickle cell crisis.  Bedside visit Patient is awake and oriented x 4, anxious, SpO2 90% heart rate 120s. Patient has a reported fever 101.53F covered with blanket and in a warm room, measures to cool have been initiated by RN.  Patient has some degree of pain with limited use of PCA and/or oral pain medication, both of which are administered at this time. Chest x-ray obtained.-Pending  During the duration of the visit heart rate did decrease some, pain has decreased some along with heart rate. SpO2 between 89 and 94%   Interventions/ Plan   Chest x-ray/pending Catch up with pain medication Nebulizer treatment available       Update 0320 hrs. Imaging in part:  FINDINGS: Cardiac shadow is enlarged but stable accentuated by the portable technique. Right chest wall port is again seen and stable. Increasing central vascular congestion is noted. Increased airspace opacity on the right is noted, likely representing edema. No acute bony abnormality is seen.   IMPRESSION: Increasing vascular congestion and likely edema particularly on the right.     Electronically Signed   By: Oneil Devonshire M.D.   On: 02/09/2024 02:18 --------------------------------------------------------------------- (Highlighted added above, during this note, for emphasis of most pertinent ) In reference to the above findings,  very small dose Lasix will be given and monitored for improvement.  ---------------------------------------------------------  Update 0607 hrs SPO2 improved to 96% after admin of Lasix.     Lynwood Kipper BSN MSNA [name] ACNPC-AG Acute Care Nurse Practitioner [facility]

## 2024-02-09 NOTE — Progress Notes (Signed)
 Patient ID: Martin [name], male   DOB: 17-Sep-1990, 33 y.o.   MRN: [id number] Subjective: Martin [name]  is a 33 y.o. male with history of sickle cell disease, history of acute chest syndrome, multiple sickle cell crisis, multiple ED visits and hospital admission, history of PE currently on Xerelto, chronic hypoxic respiratory failure on home oxygen , HIV infection and generalized anxiety disorder who presented to the ED with major complaints of generalized body pain, SOB and left side chest pain that is not consistent with his typical sickle cell pain crisis.   Patient continues to report SOB, and pain of  9/10. He denies subjective fever, cough, N/V/D. No urinary symptoms.     Objective:  Vital signs in last 24 hours:  Vitals:   02/09/24 0550 02/09/24 0730 02/09/24 1037 02/09/24 1207  BP: 105/68  114/78   Pulse: 93  98   Resp: 18 19 12 14   Temp: 97.7 F (36.5 C)  97.7 F (36.5 C)   TempSrc: Oral  Oral   SpO2: 96% 98% 98% 94%  Weight:      Height:        Intake/Output from previous day:   Intake/Output Summary (Last 24 hours) at 02/09/2024 1327 Last data filed at 02/09/2024 1044 Gross per 24 hour  Intake 707.12 ml  Output 875 ml  Net -167.88 ml    Physical Exam: General: Alert, awake, oriented x3, in no acute distress.  HEENT: [place]/AT PEERL, EOMI Neck: Trachea midline,  no masses, no thyromegal,y no JVD, no carotid bruit OROPHARYNX:  Moist, No exudate/ erythema/lesions.  Heart: Regular rate and rhythm, without murmurs, rubs, gallops, PMI non-displaced, no heaves or thrills on palpation.  Lungs: SOB, Crackles Abdomen: Soft, nontender, nondistended, positive bowel sounds, no masses no hepatosplenomegaly noted..  Neuro: No focal neurological deficits noted cranial nerves II through XII grossly intact. DTRs 2+ bilaterally upper and lower extremities. Strength 5 out of 5 in bilateral upper and lower extremities. Musculoskeletal: Generalize body tenderness Psychiatric:  Patient alert and oriented x3, good insight and cognition, good recent to remote recall. Lymph node survey: No cervical axillary or inguinal lymphadenopathy noted.  Lab Results:  Basic Metabolic Panel:    Component Value Date/Time   NA 137 02/08/2024 0038   NA 140 01/12/2023 1607   K 4.3 02/08/2024 0038   CL 105 02/08/2024 0038   CO2 22 02/08/2024 0038   BUN 19 02/08/2024 0038   BUN 8 01/12/2023 1607   CREATININE 1.06 02/08/2024 0038   GLUCOSE 122 (H) 02/08/2024 0038   CALCIUM  8.6 (L) 02/08/2024 0038   CBC:    Component Value Date/Time   WBC 13.1 (H) 02/09/2024 0638   HGB 7.9 (L) 02/09/2024 0638   HGB 7.4 (L) 01/12/2023 1607   HCT 22.6 (L) 02/09/2024 0638   HCT 21.7 (L) 01/12/2023 1607   PLT 224 02/09/2024 0638   PLT 321 01/12/2023 1607   MCV 102.7 (H) 02/09/2024 0638   MCV 94 01/12/2023 1607   NEUTROABS 4.4 02/08/2024 0802   NEUTROABS 3.8 01/12/2023 1607   LYMPHSABS 5.7 (H) 02/08/2024 0802   LYMPHSABS 5.6 (H) 01/12/2023 1607   MONOABS 1.3 (H) 02/08/2024 0802   EOSABS 0.1 02/08/2024 0802   EOSABS 0.2 01/12/2023 1607   BASOSABS 0.0 02/08/2024 0802   BASOSABS 0.1 01/12/2023 1607    Recent Results (from the past 240 hours)  Urine Culture     Status: None   Collection Time: 02/04/24  3:06 PM   Specimen: Urine  UR  Result Value Ref Range Status   Urine Culture, Routine Final report  Final   Organism ID, Bacteria Comment  Final    Comment: Mixed urogenital flora Less than 10,000 colonies/mL     Studies/Results: DG CHEST PORT 1 VIEW Result Date: 02/09/2024 CLINICAL DATA:  Increasing shortness of breath EXAM: PORTABLE CHEST 1 VIEW COMPARISON:  Film from the previous day. FINDINGS: Cardiac shadow is enlarged but stable accentuated by the portable technique. Right chest wall port is again seen and stable. Increasing central vascular congestion is noted. Increased airspace opacity on the right is noted likely representing edema. No acute bony abnormality is seen.  IMPRESSION: Increasing vascular congestion and likely edema particularly on the right. Electronically Signed   By: Oneil Devonshire M.D.   On: 02/09/2024 02:18   CT Angio Chest PE W and/or Wo Contrast Result Date: 02/08/2024 EXAM: CTA CHEST, ABDOMEN AND PELVIS WITHOUT AND WITH CONTRAST 02/08/2024 03:23:01 AM TECHNIQUE: CTA of the chest was performed without and with the administration of intravenous contrast. CTA of the abdomen and pelvis was performed without and with the administration of intravenous contrast. Multiplanar reformatted images are provided for review. MIP images are provided for review. Automated exposure control, iterative reconstruction, and/or weight based adjustment of the mA/kV was utilized to reduce the radiation dose to as low as reasonably achievable. COMPARISON: None available. CLINICAL HISTORY: Pulmonary embolism (PE) suspected, high prob. Placed patient on 4L [place]. Patient is normally on this amount when he is in crisis. O2 is staying around 90-92 which patient states he stays in the low 90s normally. 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 FINDINGS: VASCULATURE: AORTA: No acute finding. No abdominal aortic aneurysm. No dissection. PULMONARY ARTERIES: The main pulmonary artery is enlarged in caliber measuring up to 4 cm. Interval increase in size of a right lower lobe segmental and subsegmental pulmonary embolus (11:227). Question interval development of a left upper lobe subsegmental pulmonary embolus (11:100). GREAT VESSELS OF AORTIC ARCH: No acute finding. No dissection. No arterial occlusion or significant stenosis. CELIAC TRUNK: No acute finding. No occlusion or significant stenosis. SUPERIOR MESENTERIC ARTERY: No acute finding. No occlusion or significant stenosis. INFERIOR MESENTERIC ARTERY: No acute finding. No occlusion or significant stenosis. RENAL ARTERIES: No acute finding. No occlusion or significant stenosis.  ILIAC ARTERIES: No acute finding. No occlusion or significant stenosis. CHEST: MEDIASTINUM: Stable Enlarged right hilar lymph nodes measuring up to 2.3 cm. Stable Enlarged left hilar lymph nodes measuring up to 1 cm. Stable Enlarged prevascular lymph node measuring up to 1.2 cm. Stable enlarged right heart Otherwise heart and pericardium demonstrate no acute abnormality. LUNGS AND PLEURA: Chronic architectural distortion. Mild paraseptal emphysematous changes. Persistent interlobular septal wall thickening with mosaic attenuation of the lungs. Interval increase of patchy ground glass airspace opacities that are most prominent along the right middle lobe. Interlobular septal wall thickening. No pulmonary infarction. No evidence of pleural effusion or pneumothorax. THORACIC BONES AND SOFT TISSUES: Slight heterogeneity of the axial and appendicular skeleton likely related to sickle cell disease. Prominent but not enlarged bilateral axillary lymph nodes. ABDOMEN AND PELVIS: LIVER: Portal edema. Multiple vague pericentimeter hepatic lesions more prominent along the left hepatic lobe (2:27). Enlarged liver measuring up to 22 cm. GALLBLADDER AND BILE DUCTS: Status post cholecystectomy. No biliary ductal dilatation. 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 PANCREAS: The pancreas is unremarkable. ADRENAL GLANDS: Bilateral  adrenal glands demonstrate no acute abnormality. KIDNEYS, URETERS AND BLADDER: No stones in the kidneys or ureters. No hydronephrosis. No perinephric or periureteral stranding. Urinary bladder is unremarkable. GI AND BOWEL: Stomach and duodenal sweep demonstrate no acute abnormality. There is no bowel obstruction. No abnormal bowel wall thickening or distension. The appendix is not definitely identified, with no inflammatory changes in the right lower quadrant to suggest acute appendicitis. REPRODUCTIVE: Reproductive organs are unremarkable. PERITONEUM  AND RETROPERITONEUM: No ascites or free air. LYMPH NODES: No lymphadenopathy. ABDOMINAL BONES AND SOFT TISSUES: Slight heterogeneity of the axial and appendicular skeleton likely related to sickle cell disease. No acute soft tissue abnormality. IMPRESSION: 1. Interval increase in size of a right lower lobe segmental and subsegmental pulmonary embolus, with possible new left upper lobe subsegmental pulmonary embolus. No pulmonary infarction. Chronic right heart failure with no findings to suggest worsened acute right heart strain. 2. Persistent mosaic attenuation and interlobular septal thickening of the lungs with superimposed interval increase of patchy ground glass airspace opacities that are most prominent along the right middle lobe has been questioned for infection /inflammation . 3. Enlarged main pulmonary artery measuring up to 4 cm. Finding consistent with pulmonary hypertension. 4. Hepatomegaly with multiple vague pericentimeter hepatic lesions more prominent along the left hepatic lobe and portal edema. Question developing nutmeg liver morphology. 5. Stable and large bilateral hilar and mediastinal lymph nodes, as well as prominence but not enlarged bilateral axillary lymph nodes. 6. Other, non-acute and/or normal findings as above. Electronically signed by: Morgane Naveau MD 02/08/2024 03:45 AM EDT RP Workstation: [id number]   CT ABDOMEN PELVIS W CONTRAST Result Date: 02/08/2024 EXAM: CTA CHEST, ABDOMEN AND PELVIS WITHOUT AND WITH CONTRAST 02/08/2024 03:23:01 AM TECHNIQUE: CTA of the chest was performed without and with the administration of intravenous contrast. CTA of the abdomen and pelvis was performed without and with the administration of intravenous contrast. Multiplanar reformatted images are provided for review. MIP images are provided for review. Automated exposure control, iterative reconstruction, and/or weight based adjustment of the mA/kV was utilized to reduce the radiation dose to as  low as reasonably achievable. COMPARISON: None available. CLINICAL HISTORY: Pulmonary embolism (PE) suspected, high prob. Placed patient on 4L [place]. Patient is normally on this amount when he is in crisis. O2 is staying around 90-92 which patient states he stays in the low 90s normally. 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 FINDINGS: VASCULATURE: AORTA: No acute finding. No abdominal aortic aneurysm. No dissection. PULMONARY ARTERIES: The main pulmonary artery is enlarged in caliber measuring up to 4 cm. Interval increase in size of a right lower lobe segmental and subsegmental pulmonary embolus (11:227). Question interval development of a left upper lobe subsegmental pulmonary embolus (11:100). GREAT VESSELS OF AORTIC ARCH: No acute finding. No dissection. No arterial occlusion or significant stenosis. CELIAC TRUNK: No acute finding. No occlusion or significant stenosis. SUPERIOR MESENTERIC ARTERY: No acute finding. No occlusion or significant stenosis. INFERIOR MESENTERIC ARTERY: No acute finding. No occlusion or significant stenosis. RENAL ARTERIES: No acute finding. No occlusion or significant stenosis. ILIAC ARTERIES: No acute finding. No occlusion or significant stenosis. CHEST: MEDIASTINUM: Stable Enlarged right hilar lymph nodes measuring up to 2.3 cm. Stable Enlarged left hilar lymph nodes measuring up to 1 cm. Stable Enlarged prevascular lymph node measuring up to 1.2 cm. Stable enlarged right heart Otherwise heart and pericardium demonstrate no acute abnormality. LUNGS AND PLEURA: Chronic architectural distortion.  Mild paraseptal emphysematous changes. Persistent interlobular septal wall thickening with mosaic attenuation of the lungs. Interval increase of patchy ground glass airspace opacities that are most prominent along the right middle lobe. Interlobular septal wall thickening. No pulmonary infarction. No evidence of pleural effusion  or pneumothorax. THORACIC BONES AND SOFT TISSUES: Slight heterogeneity of the axial and appendicular skeleton likely related to sickle cell disease. Prominent but not enlarged bilateral axillary lymph nodes. ABDOMEN AND PELVIS: LIVER: Portal edema. Multiple vague pericentimeter hepatic lesions more prominent along the left hepatic lobe (2:27). Enlarged liver measuring up to 22 cm. GALLBLADDER AND BILE DUCTS: Status post cholecystectomy. No biliary ductal dilatation. 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 PANCREAS: The pancreas is unremarkable. ADRENAL GLANDS: Bilateral adrenal glands demonstrate no acute abnormality. KIDNEYS, URETERS AND BLADDER: No stones in the kidneys or ureters. No hydronephrosis. No perinephric or periureteral stranding. Urinary bladder is unremarkable. GI AND BOWEL: Stomach and duodenal sweep demonstrate no acute abnormality. There is no bowel obstruction. No abnormal bowel wall thickening or distension. The appendix is not definitely identified, with no inflammatory changes in the right lower quadrant to suggest acute appendicitis. REPRODUCTIVE: Reproductive organs are unremarkable. PERITONEUM AND RETROPERITONEUM: No ascites or free air. LYMPH NODES: No lymphadenopathy. ABDOMINAL BONES AND SOFT TISSUES: Slight heterogeneity of the axial and appendicular skeleton likely related to sickle cell disease. No acute soft tissue abnormality. IMPRESSION: 1. Interval increase in size of a right lower lobe segmental and subsegmental pulmonary embolus, with possible new left upper lobe subsegmental pulmonary embolus. No pulmonary infarction. Chronic right heart failure with no findings to suggest worsened acute right heart strain. 2. Persistent mosaic attenuation and interlobular septal thickening of the lungs with superimposed interval increase of patchy ground glass airspace opacities that are most prominent along the right middle lobe has been  questioned for infection /inflammation . 3. Enlarged main pulmonary artery measuring up to 4 cm. Finding consistent with pulmonary hypertension. 4. Hepatomegaly with multiple vague pericentimeter hepatic lesions more prominent along the left hepatic lobe and portal edema. Question developing nutmeg liver morphology. 5. Stable and large bilateral hilar and mediastinal lymph nodes, as well as prominence but not enlarged bilateral axillary lymph nodes. 6. Other, non-acute and/or normal findings as above. Electronically signed by: Morgane Naveau MD 02/08/2024 03:45 AM EDT RP Workstation: [id number]   DG Chest Portable 1 View Result Date: 02/08/2024 EXAM: 1 VIEW(S) XRAY OF THE CHEST 02/08/2024 12:28:00 AM COMPARISON: 01/16/2024 CLINICAL HISTORY: sob beyond baseline, chest pain. Pt having sickle cell criss, SOB and chest pain. FINDINGS: LINES, TUBES AND DEVICES: Similar positioning of the right chest port-a-cath with loop catheter tubing at the thoracic inlet. The tip is in the low SVC. LUNGS AND PLEURA: Prominent central pulmonary arteries. Similar bilateral interstitial coarsening. Question vague ground-glass opacities in the mid lungs. Right apical pleural parenchymal scarring. No pulmonary edema. No pleural effusion. No pneumothorax. HEART AND MEDIASTINUM: Stable cardiomediastinal silhouette. BONES AND SOFT TISSUES: No acute osseous abnormality. IMPRESSION: 1. Similar interstitial coarsening with possible vague mid-lung ground-glass opacities. This may be projectional artifact, or due to edema or infection. Electronically signed by: Norman Gatlin MD 02/08/2024 12:36 AM EDT RP Workstation: [id number]    Medications: Scheduled Meds:  abacavir -dolutegravir -lamiVUDine   1 tablet Oral Daily   Chlorhexidine  Gluconate Cloth  6 each Topical Daily   folic acid   500 mcg Oral Daily   guaiFENesin   600 mg Oral BID   HYDROmorphone    Intravenous Q4H   hydroxyurea   2,000 mg Oral QHS   senna-docusate  2 tablet Oral QHS    sodium chloride  flush  10-40 mL Intracatheter Q12H   Continuous Infusions:  heparin  1,850 Units/hr (02/09/24 1207)   PRN Meds:.acetaminophen  **OR** acetaminophen , albuterol , cyclobenzaprine , diphenhydrAMINE , gabapentin , melatonin, naloxone  **AND** sodium chloride  flush, ondansetron  (ZOFRAN ) IV, oxyCODONE -acetaminophen  **AND** oxyCODONE , pantoprazole , polyethylene glycol, sodium chloride  flush  Consultants: None  Procedures: Echo  Antibiotics: None  Assessment/Plan: Principal Problem:   Acute pulmonary embolism (HCC) Active Problems:   Generalized anxiety disorder   HIV (human immunodeficiency virus infection) (HCC)   Chronic pain syndrome   Leukocytosis   Acute on chronic respiratory failure with hypoxia (HCC)   AKI (acute kidney injury)   Sickle cell pain crisis (HCC)   Hb Sickle Cell Disease with Pain crisis: Continue IVF 0.45% Saline @ KVO,  continue weight based Dilaudid  PCA, IV Toradol  15 mg Q 6 H for a total of 5 days, continue oral home pain medications as ordered. Monitor vitals very closely, Re-evaluate pain scale regularly, 2 L of Oxygen  by [place]. Patient encouraged to ambulate on the hallway today.  Acute Pulmonary embolism [facility]): last admission patient was positive for a small right lower lobe segmental or subsegmental pulmonary embolus. New CT: Interval increase in size of a right lower lobe segmental and subsegmental pulmonary embolus, with possible new left upper lobe subsegmental pulmonary embolus. Continue heparin  for 5 days. Patient will change from Xarelto to Pradaxa 150 mg 2 X daily on discharge. Due to cost and insurance coverage.  Leukocytosis: Elevated. Will continue to monitor without antibiotics.  Anemia of Chronic Disease: Hgb within patients baseline.  Chronic pain Syndrome: Continue oral home pain medication  AKI (Acute kidney disease): Discontinue nephrotoxic medication.  HIV (human immunodeficiency virus infection ): Clinically stable. Continue home  medication.  Chronic hypoxic respiratory failure: Patient is saturating moderately on 6l oxygen  via nasal cannula.  Continue incentive spirometry.  Generalized anxiety disorder: Patient is clinically stable.  He denies any suicidal ideations or thoughts.  Continue home medications.    Code Status: Full Code Family Communication: N/A Disposition Plan: Not yet ready for discharge  [name] Cover NP   If 7PM-7AM, please contact night-coverage.  02/09/2024, 1:27 PM  LOS: 1 day

## 2024-02-09 NOTE — Progress Notes (Signed)
 Rapid Response Event Note   Reason for Call : pt 02 requirements increased   Initial Focused Assessment: Pt alert and oriented and following commands.  Rated pain 10/10 all over.  Pt anxious and has a fever.  Breath sounds clear and decreased.  Cooling measures initiated.      Interventions: [facility], NP at the bedside see orders.  VS in flow sheet.   Plan of Care: Pt will remain in current location as new orders are completed.    Event Summary:   MD Notified: yes Call Time: 0110 Arrival Time: 0120 End Time: [id number]  Makena Mcgrady La[place], RN

## 2024-02-10 ENCOUNTER — Other Ambulatory Visit (HOSPITAL_COMMUNITY): Payer: Self-pay

## 2024-02-10 ENCOUNTER — Telehealth (HOSPITAL_COMMUNITY): Payer: Self-pay | Admitting: Pharmacy Technician

## 2024-02-10 DIAGNOSIS — I2609 Other pulmonary embolism with acute cor pulmonale: Secondary | ICD-10-CM

## 2024-02-10 DIAGNOSIS — I361 Nonrheumatic tricuspid (valve) insufficiency: Secondary | ICD-10-CM | POA: Diagnosis not present

## 2024-02-10 LAB — CBC
HCT: 18.7 % — ABNORMAL LOW (ref 39.0–52.0)
Hemoglobin: 7.2 g/dL — ABNORMAL LOW (ref 13.0–17.0)
MCH: 38.5 pg — ABNORMAL HIGH (ref 26.0–34.0)
MCHC: 38.5 g/dL — ABNORMAL HIGH (ref 30.0–36.0)
MCV: 100 fL (ref 80.0–100.0)
Platelets: 204 K/uL (ref 150–400)
RBC: 1.87 MIL/uL — ABNORMAL LOW (ref 4.22–5.81)
RDW: 26.9 % — ABNORMAL HIGH (ref 11.5–15.5)
WBC: 11.6 K/uL — ABNORMAL HIGH (ref 4.0–10.5)
nRBC: 32 % — ABNORMAL HIGH (ref 0.0–0.2)

## 2024-02-10 LAB — HEPARIN LEVEL (UNFRACTIONATED): Heparin Unfractionated: 0.51 [IU]/mL (ref 0.30–0.70)

## 2024-02-10 MED ORDER — ENOXAPARIN SODIUM 60 MG/0.6ML IJ SOSY
60.0000 mg | PREFILLED_SYRINGE | Freq: Two times a day (BID) | INTRAMUSCULAR | Status: DC
  Administered 2024-02-10: 60 mg via SUBCUTANEOUS
  Filled 2024-02-10: qty 0.6

## 2024-02-10 MED ORDER — ENOXAPARIN SODIUM 100 MG/ML IJ SOSY: 100.0000 mg | PREFILLED_SYRINGE | INTRAMUSCULAR | Status: DC

## 2024-02-10 MED ORDER — ENOXAPARIN SODIUM 100 MG/ML IJ SOSY
100.0000 mg | PREFILLED_SYRINGE | INTRAMUSCULAR | Status: AC
  Administered 2024-02-11 – 2024-02-12 (×2): 100 mg via SUBCUTANEOUS
  Filled 2024-02-10 (×2): qty 1

## 2024-02-10 MED ORDER — BISACODYL 10 MG RE SUPP
10.0000 mg | Freq: Once | RECTAL | Status: AC
  Administered 2024-02-10: 10 mg via RECTAL
  Filled 2024-02-10: qty 1

## 2024-02-10 MED ORDER — ENOXAPARIN SODIUM 40 MG/0.4ML IJ SOSY
40.0000 mg | PREFILLED_SYRINGE | Freq: Once | INTRAMUSCULAR | Status: AC
  Administered 2024-02-10: 40 mg via SUBCUTANEOUS
  Filled 2024-02-10: qty 0.4

## 2024-02-10 MED ORDER — DABIGATRAN ETEXILATE MESYLATE 150 MG PO CAPS
150.0000 mg | ORAL_CAPSULE | Freq: Two times a day (BID) | ORAL | Status: DC
  Administered 2024-02-13 – 2024-02-17 (×9): 150 mg via ORAL
  Filled 2024-02-10 (×10): qty 1

## 2024-02-10 MED ORDER — FUROSEMIDE 10 MG/ML IJ SOLN
20.0000 mg | Freq: Once | INTRAMUSCULAR | Status: AC
  Administered 2024-02-10: 20 mg via INTRAVENOUS
  Filled 2024-02-10: qty 2

## 2024-02-10 NOTE — Progress Notes (Signed)
 Patient ID: Martin [name], male   DOB: December 27, 1990, 33 y.o.   MRN: [id number] Subjective: Martin [name]  is a 33 y.o. male with history of sickle cell disease, history of acute chest syndrome, multiple sickle cell crisis, multiple ED visits and hospital admission, history of PE currently on Xerelto, chronic hypoxic respiratory failure on home oxygen , HIV infection and generalized anxiety disorder who presented to the ED with major complaints of generalized body pain, SOB and left side chest pain that is not consistent with his typical sickle cell pain crisis.   Patient continues to report SOB, and pain of  8/10 today. He denies subjective fever, cough, N/V/D. No urinary symptoms.     Objective:  Vital signs in last 24 hours:  Vitals:   02/10/24 0932 02/10/24 1026 02/10/24 1118 02/10/24 1226  BP:  99/62  106/71  Pulse:  100  93  Resp: 16  16 12   Temp:  (!) 97.3 F (36.3 C)  98.4 F (36.9 C)  TempSrc:  Oral  Oral  SpO2:  91%  91%  Weight:      Height:        Intake/Output from previous day:   Intake/Output Summary (Last 24 hours) at 02/10/2024 1314 Last data filed at 02/10/2024 0439 Gross per 24 hour  Intake --  Output 2225 ml  Net -2225 ml    Physical Exam: General: Alert, awake, oriented x3, in no acute distress.  HEENT: [place]/AT PEERL, EOMI Neck: Trachea midline,  no masses, no thyromegal,y no JVD, no carotid bruit OROPHARYNX:  Moist, No exudate/ erythema/lesions.  Heart: Regular rate and rhythm, without murmurs, rubs, gallops, PMI non-displaced, no heaves or thrills on palpation.  Lungs: SOB, Crackles Abdomen: Soft, nontender, nondistended, positive bowel sounds, no masses no hepatosplenomegaly noted..  Neuro: No focal neurological deficits noted cranial nerves II through XII grossly intact. DTRs 2+ bilaterally upper and lower extremities. Strength 5 out of 5 in bilateral upper and lower extremities. Musculoskeletal: Generalize body tenderness Psychiatric: Patient  alert and oriented x3, good insight and cognition, good recent to remote recall. Lymph node survey: No cervical axillary or inguinal lymphadenopathy noted.  Lab Results:  Basic Metabolic Panel:    Component Value Date/Time   NA 135 02/09/2024 2113   NA 140 01/12/2023 1607   K 4.6 02/09/2024 2113   CL 103 02/09/2024 2113   CO2 22 02/09/2024 2113   BUN 27 (H) 02/09/2024 2113   BUN 8 01/12/2023 1607   CREATININE 1.74 (H) 02/09/2024 2113   GLUCOSE 107 (H) 02/09/2024 2113   CALCIUM  8.2 (L) 02/09/2024 2113   CBC:    Component Value Date/Time   WBC 11.6 (H) 02/10/2024 0513   HGB 7.2 (L) 02/10/2024 0513   HGB 7.4 (L) 01/12/2023 1607   HCT 18.7 (L) 02/10/2024 0513   HCT 21.7 (L) 01/12/2023 1607   PLT 204 02/10/2024 0513   PLT 321 01/12/2023 1607   MCV 100.0 02/10/2024 0513   MCV 94 01/12/2023 1607   NEUTROABS 4.4 02/08/2024 0802   NEUTROABS 3.8 01/12/2023 1607   LYMPHSABS 5.7 (H) 02/08/2024 0802   LYMPHSABS 5.6 (H) 01/12/2023 1607   MONOABS 1.3 (H) 02/08/2024 0802   EOSABS 0.1 02/08/2024 0802   EOSABS 0.2 01/12/2023 1607   BASOSABS 0.0 02/08/2024 0802   BASOSABS 0.1 01/12/2023 1607    Recent Results (from the past 240 hours)  Urine Culture     Status: None   Collection Time: 02/04/24  3:06 PM   Specimen: Urine  UR  Result Value Ref Range Status   Urine Culture, Routine Final report  Final   Organism ID, Bacteria Comment  Final    Comment: Mixed urogenital flora Less than 10,000 colonies/mL     Studies/Results: ECHOCARDIOGRAM COMPLETE Result Date: 02/09/2024    ECHOCARDIOGRAM REPORT   Patient Name:   Martin [name] Date of Exam: 02/09/2024 Medical Rec #:  [id number]           Height:       75.0 in Accession #:    [id number]          Weight:       139.5 lb Date of Birth:  Dec 21, 1990           BSA:          1.883 m Patient Age:    33 years            BP:           114/78 mmHg Patient Gender: M                   HR:           97 bpm. Exam Location:  Inpatient  Procedure: 2D Echo, Cardiac Doppler and Color Doppler (Both Spectral and Color            Flow Doppler were utilized during procedure). Indications:    R07.9* Chest pain, unspecified  History:        Patient has prior history of Echocardiogram examinations, most                 recent 11/08/2023. Signs/Symptoms:Murmur and Chest Pain. HIV.                 Sickle cell anemia. Severe TR. Pulmonary embolus.  Sonographer:    Ellouise Mose RDCS Referring Phys: [phone] [name] COVER IMPRESSIONS  1. Left ventricular ejection fraction, by estimation, is 65 to 70%. The left ventricle has normal function. The left ventricle has no regional wall motion abnormalities. There is mild concentric left ventricular hypertrophy. Left ventricular diastolic parameters were normal. There is the interventricular septum is flattened in systole and diastole, consistent with right ventricular pressure and volume overload.  2. Right ventricular systolic function is mildly reduced. The right ventricular size is severely enlarged. There is severely elevated pulmonary artery systolic pressure. The estimated right ventricular systolic pressure is 94.9 mmHg.  3. Right atrial size was severely dilated.  4. The mitral valve is abnormal. Moderate mitral valve regurgitation. No evidence of mitral stenosis.  5. Tricuspid valve regurgitation is moderate to severe.  6. The aortic valve is tricuspid. Aortic valve regurgitation is not visualized. No aortic stenosis is present.  7. The inferior vena cava is dilated in size with <50% respiratory variability, suggesting right atrial pressure of 15 mmHg. Comparison(s): Prior images reviewed side by side. Changes from prior study are noted. RV enlarged, RVSP higher compared to prior--now severely dilated RV with RVSP 95 mmHg. Findings communicated to Dr. Jegede. FINDINGS  Left Ventricle: Left ventricular ejection fraction, by estimation, is 65 to 70%. The left ventricle has normal function. The left ventricle has no  regional wall motion abnormalities. The left ventricular internal cavity size was normal in size. There is  mild concentric left ventricular hypertrophy. The interventricular septum is flattened in systole and diastole, consistent with right ventricular pressure and volume overload. Left ventricular diastolic parameters were normal. Right Ventricle: The right ventricular size is severely enlarged. No  increase in right ventricular wall thickness. Right ventricular systolic function is mildly reduced. There is severely elevated pulmonary artery systolic pressure. The tricuspid regurgitant velocity is 4.47 m/s, and with an assumed right atrial pressure of 15 mmHg, the estimated right ventricular systolic pressure is 94.9 mmHg. Left Atrium: Left atrial size was normal in size. Right Atrium: Right atrial size was severely dilated. Pericardium: Trivial pericardial effusion is present. Mitral Valve: Mitral valve bowing without frank prolapse. The mitral valve is abnormal. Moderate mitral valve regurgitation. No evidence of mitral valve stenosis. Tricuspid Valve: The tricuspid valve is grossly normal. Tricuspid valve regurgitation is moderate to severe. No evidence of tricuspid stenosis. Aortic Valve: The aortic valve is tricuspid. Aortic valve regurgitation is not visualized. No aortic stenosis is present. Pulmonic Valve: The pulmonic valve was normal in structure. Pulmonic valve regurgitation is trivial. No evidence of pulmonic stenosis. Aorta: The aortic root, ascending aorta, aortic arch and descending aorta are all structurally normal, with no evidence of dilitation or obstruction. Venous: The inferior vena cava is dilated in size with less than 50% respiratory variability, suggesting right atrial pressure of 15 mmHg. IAS/Shunts: There is left bowing of the interatrial septum, suggestive of elevated right atrial pressure. No atrial level shunt detected by color flow Doppler.  LEFT VENTRICLE PLAX 2D LVIDd:         4.30  cm     Diastology LVIDs:         2.60 cm     LV e' medial:    7.18 cm/s LV PW:         1.10 cm     LV E/e' medial:  9.2 LV IVS:        1.20 cm     LV e' lateral:   14.50 cm/s LVOT diam:     2.40 cm     LV E/e' lateral: 4.5 LV SV:         75 LV SV Index:   40 LVOT Area:     4.52 cm LV IVRT:       81 msec  LV Volumes (MOD) LV vol d, MOD A2C: 99.2 ml LV vol d, MOD A4C: 39.1 ml LV vol s, MOD A2C: 26.7 ml LV vol s, MOD A4C: 10.3 ml LV SV MOD A2C:     72.5 ml LV SV MOD A4C:     39.1 ml LV SV MOD BP:      48.7 ml RIGHT VENTRICLE             IVC RV S prime:     14.30 cm/s  IVC diam: 3.00 cm TAPSE (M-mode): 1.3 cm LEFT ATRIUM           Index        RIGHT ATRIUM           Index LA diam:      3.50 cm 1.86 cm/m   RA Area:     25.90 cm LA Vol (A2C): 37.6 ml 19.97 ml/m  RA Volume:   95.30 ml  50.61 ml/m LA Vol (A4C): 33.9 ml 18.00 ml/m  AORTIC VALVE             PULMONIC VALVE LVOT Vmax:   107.00 cm/s PR End Diast Vel: 2.81 msec LVOT Vmean:  67.900 cm/s LVOT VTI:    0.166 m  AORTA Ao Root diam: 3.10 cm Ao Asc diam:  3.30 cm MITRAL VALVE  TRICUSPID VALVE MV Area (PHT): 3.99 cm       TR Peak grad:   79.9 mmHg MV Decel Time: 190 msec       TR Vmax:        447.00 cm/s MR Peak grad:    83.5 mmHg MR Mean grad:    46.0 mmHg    SHUNTS MR Vmax:         457.00 cm/s  Systemic VTI:  0.17 m MR Vmean:        317.0 cm/s   Systemic Diam: 2.40 cm MR PISA:         1.01 cm MR PISA Eff ROA: 8 mm MR PISA Radius:  0.40 cm MV E velocity: 65.75 cm/s MV A velocity: 54.80 cm/s MV E/A ratio:  1.20 Shelda Bruckner MD Electronically signed by Shelda Bruckner MD Signature Date/Time: 02/09/2024/3:51:44 PM    Final    DG CHEST PORT 1 VIEW Result Date: 02/09/2024 CLINICAL DATA:  Increasing shortness of breath EXAM: PORTABLE CHEST 1 VIEW COMPARISON:  Film from the previous day. FINDINGS: Cardiac shadow is enlarged but stable accentuated by the portable technique. Right chest wall port is again seen and stable. Increasing  central vascular congestion is noted. Increased airspace opacity on the right is noted likely representing edema. No acute bony abnormality is seen. IMPRESSION: Increasing vascular congestion and likely edema particularly on the right. Electronically Signed   By: Oneil Devonshire M.D.   On: 02/09/2024 02:18    Medications: Scheduled Meds:  abacavir -dolutegravir -lamiVUDine   1 tablet Oral Daily   bisacodyl   10 mg Rectal Once   Chlorhexidine  Gluconate Cloth  6 each Topical Daily   [START ON 02/13/2024] dabigatran  150 mg Oral Q12H   enoxaparin  (LOVENOX ) injection  100 mg Subcutaneous Q24H   folic acid   500 mcg Oral Daily   guaiFENesin   600 mg Oral BID   HYDROmorphone    Intravenous Q4H   hydroxyurea   2,000 mg Oral QHS   senna-docusate  2 tablet Oral QHS   sodium chloride  flush  10-40 mL Intracatheter Q12H   Continuous Infusions:   PRN Meds:.acetaminophen  **OR** acetaminophen , albuterol , cyclobenzaprine , diphenhydrAMINE , gabapentin , lip balm, melatonin, naloxone  **AND** sodium chloride  flush, ondansetron  (ZOFRAN ) IV, oxyCODONE -acetaminophen  **AND** oxyCODONE , pantoprazole , polyethylene glycol, sodium chloride  flush  Consultants: Cardiology  Procedures: Echo  Antibiotics: None  Assessment/Plan: Principal Problem:   Acute pulmonary embolism (HCC) Active Problems:   Generalized anxiety disorder   HIV (human immunodeficiency virus infection) (HCC)   Chronic pain syndrome   Leukocytosis   Acute on chronic respiratory failure with hypoxia (HCC)   AKI (acute kidney injury)   Sickle cell pain crisis (HCC)   Hb Sickle Cell Disease with Pain crisis: Ongoing pain, continue IVF 0.45% Saline @ KVO,  continue weight based Dilaudid  PCA, IV Toradol  15 mg Q 6 H for a total of 5 days, continue oral home pain medications as ordered. Monitor vitals very closely, Re-evaluate pain scale regularly, 2 L of Oxygen  by . Patient encouraged to ambulate on the hallway today.  Acute Pulmonary embolism [facility]):  last admission patient was positive for a small right lower lobe segmental or subsegmental pulmonary embolus. New CT: Interval increase in size of a right lower lobe segmental and subsegmental pulmonary embolus, with possible new left upper lobe subsegmental pulmonary embolus.  Heparin  discontinued, started Lovenox  today to bridge patient to Pradaxa 150 mg 2 X daily on discharge.  Patient is transferred to the progressive today with cardiology consult due to echo results showing  RV is larger than prior, now severely enlarged. RVSP also higher at 95 mmHg. Chest x-ray showing increased vascular congestion and likely edema particularly on the right. Leukocytosis: Elevated. Will continue to monitor without antibiotics.  Anemia of Chronic Disease: Hgb within patients baseline.  Chronic pain Syndrome: Continue oral home pain medication  AKI (Acute kidney disease): Discontinue nephrotoxic medication.  HIV (human immunodeficiency virus infection ): Clinically stable. Continue home medication.  Chronic hypoxic respiratory failure: Patient is saturating moderately on 6l oxygen  via nasal cannula.  Continue incentive spirometry.  Generalized anxiety disorder: Patient is clinically stable.  He denies any suicidal ideations or thoughts.  Continue home medications.    Code Status: Full Code Family Communication: N/A Disposition Plan: Not yet ready for discharge  [name] Cover NP   If 7PM-7AM, please contact night-coverage.  02/10/2024, 1:14 PM  LOS: 2 days

## 2024-02-10 NOTE — Plan of Care (Signed)
  Problem: Sensory: Goal: Pain level will decrease with appropriate interventions Outcome: Progressing   Problem: Clinical Measurements: Goal: Diagnostic test results will improve Outcome: Progressing Goal: Respiratory complications will improve Outcome: Progressing Goal: Cardiovascular complication will be avoided Outcome: Progressing   Problem: Elimination: Goal: Will not experience complications related to bowel motility Outcome: Progressing Goal: Will not experience complications related to urinary retention Outcome: Progressing   Problem: Pain Managment: Goal: General experience of comfort will improve and/or be controlled Outcome: Progressing   Problem: Safety: Goal: Ability to remain free from injury will improve Outcome: Progressing

## 2024-02-10 NOTE — Telephone Encounter (Signed)
 Patient Advocate Encounter  Completed and sent [employer] Access application for [employer]  for this patient who is uninsured.      BIN      [id number] PCN    PDMI GRP    [id number] ID        [id number]   Reyes Sharps, CPhT Pharmacy Patient Advocate Specialist [facility] Patient Advocate Team Direct Number: [phone] Fax: [phone]

## 2024-02-10 NOTE — Progress Notes (Signed)
 PHARMACY - ANTICOAGULATION CONSULT NOTE  Pharmacy Consult for heparin  Indication: pulmonary embolus  Allergies  Allergen Reactions   Ketorolac  Tromethamine  Swelling and Other (See Comments)    Patient reports facial edema and left arm edema after administration.   Tape Rash and Other (See Comments)    PLEASE DO NOT USE THE CLEAR, THICK, PLASTIC TAPE- only paper tape is tolerated    Wound Dressing Adhesive Rash    Patient Measurements: Height: 6' 3 (190.5 cm) Weight: 63.4 kg (139 lb 12.4 oz) IBW/kg (Calculated) : 84.5 HEPARIN  DW (KG): 63.3 Last Weight  Most recent update: 02/10/2024  4:40 AM    Weight  63.4 kg (139 lb 12.4 oz)           Weight 63.5 kg, Ht 75  Vital Signs: Temp: 98.7 F (37.1 C) (10/23 0422) Temp Source: Oral (10/23 0422) BP: 98/57 (10/23 0422) Pulse Rate: 98 (10/23 0422)  Labs: Recent Labs    02/08/24 0038 02/08/24 0139 02/08/24 0520 02/08/24 0802 02/08/24 1130 02/09/24 9361 02/09/24 1300 02/09/24 1828 02/09/24 2039 02/09/24 2113 02/10/24 0513  HGB 8.3*  --   --  7.9*  --  7.9*  --   --   --   --  7.2*  HCT 22.6*  --   --  22.0*  --  22.6*  --   --   --   --  18.7*  PLT 267  --   --  259  --  224  --   --   --   --  204  APTT  --   --  38*  --   --   --   --   --   --   --   --   LABPROT  --  17.1*  --   --   --   --   --   --   --   --   --   INR  --  1.3*  --   --   --   --   --   --   --   --   --   HEPARINUNFRC  --   --  <0.10*  --    < > 0.31  --  >1.10* 0.53  --  0.51  CREATININE 1.06  --   --   --   --   --  2.19*  --   --  1.74*  --    < > = values in this interval not displayed.    Estimated Creatinine Clearance: 53.6 mL/min (A) (by C-G formula based on SCr of 1.74 mg/dL (H)).   Medical History: Past Medical History:  Diagnosis Date   Anxiety    Chest pain 02/04/2024   HIV (human immunodeficiency virus infection) (HCC)    Proteinuria    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HCC)    Vitamin D  deficiency 10/2018    Medications:   Infusions:   heparin  1,850 Units/hr (02/09/24 1207)    Assessment: [name] with hx PE.  He was switched from Eliquis  to Xarelto after developing new PE while on Eliquis  (01/15/24) with intermittent compliance.  He reports not being able to afford Xarelto and has not taken since being discharged from the hospital on 02/01/24. Chest CT (10/21) shows increased size of RLL PE and possible new LUL PE.  Chronic RHF, but no worsened acute RHS.  Baseline Hgb low at 8.3 but appears to be near baseline; pltc WNL.  Baseline coags: INR  1.3, aPTT 38, HL <0.1  Today, 02/10/2024: Heparin  level 0.51- remains therapeutic on IV heparin  1850 units/hr CBC: Hgb low/stable at 7.2, Plt WNL RN reports no bleeding or IV interruptions/pauses  SCr elevated/improved to 1.74    Goal of Therapy:  Heparin  level 0.3-0.7 units/ml Monitor platelets by anticoagulation protocol: Yes   Plan:  D/C heparin  and start Enoxaparin  1 mg/kg (60mg ) SQ q12h Complete 5 day parenteral bridge, then transition to Pradaxa 150mg  PO BID on 10/26  [facility] Pradaxa prescription for discharge.  Follow up renal function and CBC at least q72h    [facility] PharmD, BCPS [facility] main pharmacy [phone] 02/10/2024 9:20 AM

## 2024-02-10 NOTE — Consult Note (Signed)
 Patient vomited approximately 100 mls green liquid into bag, MD notified. Ok to give Zofran  early.  Zofran  administered.

## 2024-02-10 NOTE — Progress Notes (Signed)
 PHARMACY - ANTICOAGULATION CONSULT NOTE  Pharmacy Consult for heparin  Indication: pulmonary embolus  Allergies  Allergen Reactions   Ketorolac  Tromethamine  Swelling and Other (See Comments)    Patient reports facial edema and left arm edema after administration.   Tape Rash and Other (See Comments)    PLEASE DO NOT USE THE CLEAR, THICK, PLASTIC TAPE- only paper tape is tolerated    Wound Dressing Adhesive Rash    Patient Measurements: Height: 6' 3 (190.5 cm) Weight: 63.4 kg (139 lb 12.4 oz) IBW/kg (Calculated) : 84.5 HEPARIN  DW (KG): 63.3 Last Weight  Most recent update: 02/10/2024  4:40 AM    Weight  63.4 kg (139 lb 12.4 oz)           Weight 63.5 kg, Ht 75  Vital Signs: Temp: 98.7 F (37.1 C) (10/23 0422) Temp Source: Oral (10/23 0422) BP: 98/57 (10/23 0422) Pulse Rate: 98 (10/23 0422)  Labs: Recent Labs    02/08/24 0038 02/08/24 0139 02/08/24 0520 02/08/24 0802 02/08/24 1130 02/09/24 9361 02/09/24 1300 02/09/24 1828 02/09/24 2039 02/09/24 2113 02/10/24 0513  HGB 8.3*  --   --  7.9*  --  7.9*  --   --   --   --  7.2*  HCT 22.6*  --   --  22.0*  --  22.6*  --   --   --   --  18.7*  PLT 267  --   --  259  --  224  --   --   --   --  204  APTT  --   --  38*  --   --   --   --   --   --   --   --   LABPROT  --  17.1*  --   --   --   --   --   --   --   --   --   INR  --  1.3*  --   --   --   --   --   --   --   --   --   HEPARINUNFRC  --   --  <0.10*  --    < > 0.31  --  >1.10* 0.53  --  0.51  CREATININE 1.06  --   --   --   --   --  2.19*  --   --  1.74*  --    < > = values in this interval not displayed.    Estimated Creatinine Clearance: 53.6 mL/min (A) (by C-G formula based on SCr of 1.74 mg/dL (H)).   Medical History: Past Medical History:  Diagnosis Date   Anxiety    Chest pain 02/04/2024   HIV (human immunodeficiency virus infection) (HCC)    Proteinuria    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HCC)    Vitamin D  deficiency 10/2018    Medications:   Infusions:   heparin  Stopped (02/10/24 9046)    Assessment: [name] with hx PE.  He was switched from Eliquis  to Xarelto after developing new PE while on Eliquis  (01/15/24) with intermittent compliance.  He reports not being able to afford Xarelto and has not taken since being discharged from the hospital on 02/01/24. Chest CT (10/21) shows increased size of RLL PE and possible new LUL PE.  Chronic RHF, but no worsened acute RHS.  Baseline Hgb low at 8.3 but appears to be near baseline; pltc WNL.  Baseline coags: INR 1.3,  aPTT 38, HL <0.1  Today, 02/10/2024: Heparin  level 0.51- remains therapeutic on IV heparin  1850 units/hr CBC: Hgb low/stable at 7.2, Plt WNL RN reports no bleeding or IV interruptions/pauses  SCr elevated/improved to 1.74    Goal of Therapy:  Heparin  level 0.3-0.7 units/ml Monitor platelets by anticoagulation protocol: Yes   Plan:  D/C heparin  and start Enoxaparin  1.5 mg/kg (100mg ) SQ q24h Patient is refusing twice daily enoxaparin  injections Complete 5 day parenteral bridge, then transition to Pradaxa 150mg  PO BID on 10/26  [facility] Pradaxa prescription for discharge.  Follow up renal function and CBC at least q72h    [facility] PharmD, BCPS [facility] [phone] 02/10/2024 9:58 AM

## 2024-02-10 NOTE — Consult Note (Signed)
 Cardiology Consultation   Patient ID: Martin [name] MRN: [id number]; DOB: 1990-05-21  Admit date: 02/07/2024 Date of Consult: 02/10/2024  PCP:  [name], [name], [name]   [facility] Providers Cardiologist:  None        Patient Profile: Martin [name] is a 33 y.o. 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  History of Present Illness: [name] has not been formally seen by cardiology previously.  He does have an echo on file 10/2023 showing a moderately enlarged RV with mildly reduced systolic function with PASP 67.3 mmHg. he has a history of pulmonary embolism dating back to 2020 multiple recurrences. He is on chronic anticoagulation with xarelto.  He has chronic hypoxic respiratory failure and uses home oxygen  [ ~4L]  Patient was admitted on 10/21 for sickle cell crisis.  BP 102/67      HR 106   Oxygen  requirement increased to 7L [place] [above baseline] ECG showed sinus tachycardia, TWI V1-V4 signs of RVH VR 109 [similar to 01/28/24 ECG though both showing new RVH and TWI in comparison to 01/04/24 ECG] CXR with inconclusive findings of possible artifact or edema or infection, this was repeated the next day showing vascular congestion likely pulmonary edema particularly on the right. CTA Chest and CTA/P showed interval increase in RLL segmental and subsegmental PE and possible new LLU PE, signs of possible infection/inflammation.  Enlarged main PA up to 4 cm.  Hepatomegaly with multiple hepatic lesions.  Lymphadenopathy. Echocardiogram this admission shows LV EF 65 to 70% with no RWMA.  Mild concentric LVH.  Normal diastolic parameters.  Though interventricular septum is flattened during cyst in diastole consistent with RV pressure and volume  overload.  RV is severely enlarged with mildly reduced systolic function.  Severely elevated PASP 94.9 mmHg. moderate MR.  Moderate to severe TR. Lab work: K 5.8 -> 4.6 Creatinine 1.06 -> 2.19 -> 1.74     AST 59   -> 98 ALT 22 -> 55 Tbili 11.1 -> 12. 6  Hs troponin 17 -> 16  He has been given IV Lasix 10 mg and oral lasix 20 mg. Net IO Since Admission: -1,392.88 mL [02/10/24 1059]. He has been placed on a PCA pump for pain control and IV antibiotics for his leukocytosis. BP has been low-normal with most recent 99/62. For insurances reason, patient switching to pradaxa at discharge.  On interview, patient shared he started shortness of breath on exertion over the last month.  Shared that he would get short of breath to walk to the bathroom from his current room in the hospital.  He is also reported noticing more fatigue with his shortness of breath.  Denied orthopnea, PND, abdominal distention, peripheral edema.  Appetite has been good. Reported that he typically has low to normal blood pressure.  Though does not check at home.  On chart review patient noted to have intermittent compliance with anticoagulation.     Past Medical History:  Diagnosis Date   Anxiety    Chest pain 02/04/2024   HIV (human immunodeficiency virus infection) (HCC)    Proteinuria    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HCC)    Vitamin D  deficiency 10/2018    Past Surgical History:  Procedure Laterality Date   IR IMAGING  GUIDED PORT INSERTION  08/29/2019   IR REMOVAL TUN ACCESS W/ PORT W/O FL MOD SED  08/30/2020   TEE WITHOUT CARDIOVERSION N/A 08/30/2020   Procedure: TRANSESOPHAGEAL ECHOCARDIOGRAM (TEE);  Surgeon: Mona Vinie BROCKS, MD;  Location: [facility] ENDOSCOPY;  Service: Cardiovascular;  Laterality: N/A;       Scheduled Meds:  abacavir -dolutegravir -lamiVUDine   1 tablet Oral Daily   Chlorhexidine  Gluconate Cloth  6 each Topical Daily   [START ON 02/13/2024] dabigatran  150 mg Oral Q12H   enoxaparin  (LOVENOX ) injection  100  mg Subcutaneous Q24H   folic acid   500 mcg Oral Daily   guaiFENesin   600 mg Oral BID   HYDROmorphone    Intravenous Q4H   hydroxyurea   2,000 mg Oral QHS   senna-docusate  2 tablet Oral QHS   sodium chloride  flush  10-40 mL Intracatheter Q12H   Continuous Infusions:  PRN Meds: acetaminophen  **OR** acetaminophen , albuterol , cyclobenzaprine , diphenhydrAMINE , gabapentin , lip balm, melatonin, naloxone  **AND** sodium chloride  flush, ondansetron  (ZOFRAN ) IV, oxyCODONE -acetaminophen  **AND** oxyCODONE , pantoprazole , polyethylene glycol, sodium chloride  flush  Allergies:    Allergies  Allergen Reactions   Ketorolac  Tromethamine  Swelling and Other (See Comments)    Patient reports facial edema and left arm edema after administration.   Tape Rash and Other (See Comments)    PLEASE DO NOT USE THE CLEAR, THICK, PLASTIC TAPE- only paper tape is tolerated    Wound Dressing Adhesive Rash    Social History:   Social History   Socioeconomic History   Marital status: Single    Spouse name: Not on file   Number of children: Not on file   Years of education: Not on file   Highest education level: 12th grade  Occupational History   Occupation: disabled  Tobacco Use   Smoking status: Never   Smokeless tobacco: Never  Vaping Use   Vaping status: Never Used  Substance and Sexual Activity   Alcohol  use: No   Drug use: Not Currently    Types: Marijuana    Comment: 2x week   Sexual activity: Yes    Birth control/protection: Condom  Other Topics Concern   Not on file  Social History Narrative   Lives with his siblings.    Social Drivers of Corporate investment banker Strain: Low Risk  (10/09/2023)   Received [facility]   Overall Financial Resource Strain (CARDIA)    Difficulty of Paying Living Expenses: Not very hard  Recent Concern: Financial Resource Strain - Medium Risk (08/03/2023)   Overall Financial Resource Strain (CARDIA)    Difficulty of Paying Living Expenses: Somewhat  hard  Food Insecurity: No Food Insecurity (02/08/2024)   Hunger Vital Sign    Worried About Running Out of Food in the Last Year: Never true    Ran Out of Food in the Last Year: Never true  Transportation Needs: No Transportation Needs (02/08/2024)   PRAPARE - Administrator, Civil Service (Medical): No    Lack of Transportation (Non-Medical): No  Physical Activity: Sufficiently Active (10/09/2023)   Received from [facility]   Exercise Vital Sign    On average, how many days per week do you engage in moderate to strenuous exercise (like a brisk walk)?: 7 days    On average, how many minutes do you engage in exercise at this level?: 60 min  Stress: No Stress Concern Present (10/09/2023)   Received from [facility] - Occupational Stress Questionnaire    Feeling  of Stress : Not at all  Social Connections: Socially Isolated (01/04/2024)   Social Connection and Isolation Panel    Frequency of Communication with Friends and Family: More than three times a week    Frequency of Social Gatherings with Friends and Family: Twice a week    Attends Religious Services: Never    Database administrator or Organizations: No    Attends Banker Meetings: Never    Marital Status: Never married  Intimate Partner Violence: Not At Risk (02/08/2024)   Humiliation, Afraid, Rape, and Kick questionnaire    Fear of Current or Ex-Partner: No    Emotionally Abused: No    Physically Abused: No    Sexually Abused: No    Family History:   Family History  Problem Relation Age of Onset   Sickle cell trait Mother    Sickle cell trait Father    Birth defects Maternal Grandmother    Birth defects Paternal Grandmother      ROS:  Please see the history of present illness.  All other ROS reviewed and negative.     Physical Exam/Data: Vitals:   02/10/24 0319 02/10/24 0422 02/10/24 0932 02/10/24 1026  BP:  (!) 98/57  99/62  Pulse:  98   100  Resp: 16 16 16    Temp:  98.7 F (37.1 C)  (!) 97.3 F (36.3 C)  TempSrc:  Oral  Oral  SpO2:  (!) 89%  91%  Weight:  63.4 kg    Height:        Intake/Output Summary (Last 24 hours) at 02/10/2024 1033 Last data filed at 02/10/2024 0439 Gross per 24 hour  Intake 240 ml  Output 2825 ml  Net -2585 ml      02/10/2024    4:22 AM 02/08/2024    4:30 PM 02/04/2024    1:15 PM  Last 3 Weights  Weight (lbs) 139 lb 12.4 oz 139 lb 8.1 oz 140 lb  Weight (kg) 63.4 kg 63.28 kg 63.504 kg     Body mass index is 17.47 kg/m.  General:  Alert, though sleepy appearing male in no acute distress HEENT: normal Neck: JVD Vascular: Distal pulses 2+ bilaterally Cardiac:  normal S1, S2; RRR; no murmur  Lungs:  clear to auscultation bilaterally, no wheezing, rhonchi or rales  Abd: soft, tender to RUQ  Ext: no edema Musculoskeletal:  No deformities, BUE and BLE strength normal and equal Skin: warm and dry  Neuro:  CNs 2-12 intact, no focal abnormalities noted Psych:  Normal affect   EKG:  The EKG was personally reviewed and demonstrates:  see hpi Telemetry:  Telemetry was personally reviewed and demonstrates:  sinus tachycardia HR ~ 110, no ectopy  Relevant CV Studies: Echocardiogram 11/08/23 IMPRESSIONS     1. Left ventricular ejection fraction, by estimation, is 55 to 60%. Left  ventricular ejection fraction by 3D volume is 59 %. The left ventricle has  normal function. The left ventricle has no regional wall motion  abnormalities. There is mild left  ventricular hypertrophy. Left ventricular diastolic parameters were  normal. There is the interventricular septum is flattened in systole,  consistent with right ventricular pressure overload.   2. Right ventricular systolic function is mildly reduced. The right  ventricular size is moderately enlarged. There is severely elevated  pulmonary artery systolic pressure. The estimated right ventricular  systolic pressure is 67.3 mmHg.   3.  Left atrial size was moderately dilated.   4. The mitral valve is  grossly normal. Mild mitral valve regurgitation.  No evidence of mitral stenosis.   5. The aortic valve is tricuspid. Aortic valve regurgitation is not  visualized. No aortic stenosis is present.   6. The inferior vena cava is dilated in size with >50% respiratory  variability, suggesting right atrial pressure of 8 mmHg.    Laboratory Data:   Chemistry Recent Labs  Lab 02/08/24 0038 02/08/24 0520 02/09/24 1300 02/09/24 2113  NA 137  --  135 135  K 4.3  --  5.8* 4.6  CL 105  --  103 103  CO2 22  --  20* 22  GLUCOSE 122*  --  107* 107*  BUN 19  --  28* 27*  CREATININE 1.06  --  2.19* 1.74*  CALCIUM  8.6*  --  8.1* 8.2*  MG  --  2.0  --   --   GFRNONAA >60  --  40* 52*  ANIONGAP 11  --  12 11    Recent Labs  Lab 02/08/24 0038 02/09/24 1300  PROT 7.9 7.6  ALBUMIN 4.1 4.0  AST 59* 98*  ALT 22 55*  ALKPHOS 61 62  BILITOT 11.1* 12.6*   Hematology Recent Labs  Lab 02/08/24 0802 02/09/24 0638 02/10/24 0513  WBC 11.6* 13.1* 11.6*  RBC 2.15* 2.20* 1.87*  HGB 7.9* 7.9* 7.2*  HCT 22.0* 22.6* 18.7*  MCV 102.3* 102.7* 100.0  MCH 36.7* 35.9* 38.5*  MCHC 35.9 35.0 38.5*  RDW 29.1* 26.3* 26.9*  PLT 259 224 204    Radiology/Studies:  ECHOCARDIOGRAM COMPLETE Result Date: 02/09/2024    ECHOCARDIOGRAM REPORT   Patient Name:   EDITH LORD Date of Exam: 02/09/2024 Medical Rec #:  [id number]           Height:       75.0 in Accession #:    [id number]          Weight:       139.5 lb Date of Birth:  [birth year]           BSA:          1.883 m Patient Age:    33 years            BP:           114/78 mmHg Patient Gender: M                   HR:           97 bpm. Exam Location:  Inpatient Procedure: 2D Echo, Cardiac Doppler and Color Doppler (Both Spectral and Color            Flow Doppler were utilized during procedure). Indications:    R07.9* Chest pain, unspecified  History:        Patient has prior history of  Echocardiogram examinations, most                 recent 11/08/2023. Signs/Symptoms:Murmur and Chest Pain. HIV.                 Sickle cell anemia. Severe TR. Pulmonary embolus.  Sonographer:    Ellouise Mose RDCS Referring Phys: [phone] [name] COVER IMPRESSIONS  1. Left ventricular ejection fraction, by estimation, is 65 to 70%. The left ventricle has normal function. The left ventricle has no regional wall motion abnormalities. There is mild concentric left ventricular hypertrophy. Left ventricular diastolic parameters were normal. There is the interventricular septum is flattened in systole and diastole, consistent  with right ventricular pressure and volume overload.  2. Right ventricular systolic function is mildly reduced. The right ventricular size is severely enlarged. There is severely elevated pulmonary artery systolic pressure. The estimated right ventricular systolic pressure is 94.9 mmHg.  3. Right atrial size was severely dilated.  4. The mitral valve is abnormal. Moderate mitral valve regurgitation. No evidence of mitral stenosis.  5. Tricuspid valve regurgitation is moderate to severe.  6. The aortic valve is tricuspid. Aortic valve regurgitation is not visualized. No aortic stenosis is present.  7. The inferior vena cava is dilated in size with <50% respiratory variability, suggesting right atrial pressure of 15 mmHg. Comparison(s): Prior images reviewed side by side. Changes from prior study are noted. RV enlarged, RVSP higher compared to prior--now severely dilated RV with RVSP 95 mmHg. Findings communicated to Dr. Jegede. FINDINGS  Left Ventricle: Left ventricular ejection fraction, by estimation, is 65 to 70%. The left ventricle has normal function. The left ventricle has no regional wall motion abnormalities. The left ventricular internal cavity size was normal in size. There is  mild concentric left ventricular hypertrophy. The interventricular septum is flattened in systole and diastole,  consistent with right ventricular pressure and volume overload. Left ventricular diastolic parameters were normal. Right Ventricle: The right ventricular size is severely enlarged. No increase in right ventricular wall thickness. Right ventricular systolic function is mildly reduced. There is severely elevated pulmonary artery systolic pressure. The tricuspid regurgitant velocity is 4.47 m/s, and with an assumed right atrial pressure of 15 mmHg, the estimated right ventricular systolic pressure is 94.9 mmHg. Left Atrium: Left atrial size was normal in size. Right Atrium: Right atrial size was severely dilated. Pericardium: Trivial pericardial effusion is present. Mitral Valve: Mitral valve bowing without frank prolapse. The mitral valve is abnormal. Moderate mitral valve regurgitation. No evidence of mitral valve stenosis. Tricuspid Valve: The tricuspid valve is grossly normal. Tricuspid valve regurgitation is moderate to severe. No evidence of tricuspid stenosis. Aortic Valve: The aortic valve is tricuspid. Aortic valve regurgitation is not visualized. No aortic stenosis is present. Pulmonic Valve: The pulmonic valve was normal in structure. Pulmonic valve regurgitation is trivial. No evidence of pulmonic stenosis. Aorta: The aortic root, ascending aorta, aortic arch and descending aorta are all structurally normal, with no evidence of dilitation or obstruction. Venous: The inferior vena cava is dilated in size with less than 50% respiratory variability, suggesting right atrial pressure of 15 mmHg. IAS/Shunts: There is left bowing of the interatrial septum, suggestive of elevated right atrial pressure. No atrial level shunt detected by color flow Doppler.  LEFT VENTRICLE PLAX 2D LVIDd:         4.30 cm     Diastology LVIDs:         2.60 cm     LV e' medial:    7.18 cm/s LV PW:         1.10 cm     LV E/e' medial:  9.2 LV IVS:        1.20 cm     LV e' lateral:   14.50 cm/s LVOT diam:     2.40 cm     LV E/e' lateral:  4.5 LV SV:         75 LV SV Index:   40 LVOT Area:     4.52 cm LV IVRT:       81 msec  LV Volumes (MOD) LV vol d, MOD A2C: 99.2 ml LV vol d, MOD A4C: 39.1 ml  LV vol s, MOD A2C: 26.7 ml LV vol s, MOD A4C: 10.3 ml LV SV MOD A2C:     72.5 ml LV SV MOD A4C:     39.1 ml LV SV MOD BP:      48.7 ml RIGHT VENTRICLE             IVC RV S prime:     14.30 cm/s  IVC diam: 3.00 cm TAPSE (M-mode): 1.3 cm LEFT ATRIUM           Index        RIGHT ATRIUM           Index LA diam:      3.50 cm 1.86 cm/m   RA Area:     25.90 cm LA Vol (A2C): 37.6 ml 19.97 ml/m  RA Volume:   95.30 ml  50.61 ml/m LA Vol (A4C): 33.9 ml 18.00 ml/m  AORTIC VALVE             PULMONIC VALVE LVOT Vmax:   107.00 cm/s PR End Diast Vel: 2.81 msec LVOT Vmean:  67.900 cm/s LVOT VTI:    0.166 m  AORTA Ao Root diam: 3.10 cm Ao Asc diam:  3.30 cm MITRAL VALVE                  TRICUSPID VALVE MV Area (PHT): 3.99 cm       TR Peak grad:   79.9 mmHg MV Decel Time: 190 msec       TR Vmax:        447.00 cm/s MR Peak grad:    83.5 mmHg MR Mean grad:    46.0 mmHg    SHUNTS MR Vmax:         457.00 cm/s  Systemic VTI:  0.17 m MR Vmean:        317.0 cm/s   Systemic Diam: 2.40 cm MR PISA:         1.01 cm MR PISA Eff ROA: 8 mm MR PISA Radius:  0.40 cm MV E velocity: 65.75 cm/s MV A velocity: 54.80 cm/s MV E/A ratio:  1.20 Shelda Bruckner MD Electronically signed by Shelda Bruckner MD Signature Date/Time: 02/09/2024/3:51:44 PM    Final    DG CHEST PORT 1 VIEW Result Date: 02/09/2024 CLINICAL DATA:  Increasing shortness of breath EXAM: PORTABLE CHEST 1 VIEW COMPARISON:  Film from the previous day. FINDINGS: Cardiac shadow is enlarged but stable accentuated by the portable technique. Right chest wall port is again seen and stable. Increasing central vascular congestion is noted. Increased airspace opacity on the right is noted likely representing edema. No acute bony abnormality is seen. IMPRESSION: Increasing vascular congestion and likely edema particularly  on the right. Electronically Signed   By: Oneil Devonshire M.D.   On: 02/09/2024 02:18   CT Angio Chest PE W and/or Wo Contrast Result Date: 02/08/2024 EXAM: CTA CHEST, ABDOMEN AND PELVIS WITHOUT AND WITH CONTRAST 02/08/2024 03:23:01 AM TECHNIQUE: CTA of the chest was performed without and with the administration of intravenous contrast. CTA of the abdomen and pelvis was performed without and with the administration of intravenous contrast. Multiplanar reformatted images are provided for review. MIP images are provided for review. Automated exposure control, iterative reconstruction, and/or weight based adjustment of the mA/kV was utilized to reduce the radiation dose to as low as reasonably achievable. COMPARISON: None available. CLINICAL HISTORY: Pulmonary embolism (PE) suspected, high prob. Placed patient on 4L [place]. Patient is normally on this amount when  he is in crisis. O2 is staying around 90-92 which patient states he stays in the low 90s normally. 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 FINDINGS: VASCULATURE: AORTA: No acute finding. No abdominal aortic aneurysm. No dissection. PULMONARY ARTERIES: The main pulmonary artery is enlarged in caliber measuring up to 4 cm. Interval increase in size of a right lower lobe segmental and subsegmental pulmonary embolus (11:227). Question interval development of a left upper lobe subsegmental pulmonary embolus (11:100). GREAT VESSELS OF AORTIC ARCH: No acute finding. No dissection. No arterial occlusion or significant stenosis. CELIAC TRUNK: No acute finding. No occlusion or significant stenosis. SUPERIOR MESENTERIC ARTERY: No acute finding. No occlusion or significant stenosis. INFERIOR MESENTERIC ARTERY: No acute finding. No occlusion or significant stenosis. RENAL ARTERIES: No acute finding. No occlusion or significant stenosis. ILIAC ARTERIES: No acute finding. No occlusion or significant stenosis.  CHEST: MEDIASTINUM: Stable Enlarged right hilar lymph nodes measuring up to 2.3 cm. Stable Enlarged left hilar lymph nodes measuring up to 1 cm. Stable Enlarged prevascular lymph node measuring up to 1.2 cm. Stable enlarged right heart Otherwise heart and pericardium demonstrate no acute abnormality. LUNGS AND PLEURA: Chronic architectural distortion. Mild paraseptal emphysematous changes. Persistent interlobular septal wall thickening with mosaic attenuation of the lungs. Interval increase of patchy ground glass airspace opacities that are most prominent along the right middle lobe. Interlobular septal wall thickening. No pulmonary infarction. No evidence of pleural effusion or pneumothorax. THORACIC BONES AND SOFT TISSUES: Slight heterogeneity of the axial and appendicular skeleton likely related to sickle cell disease. Prominent but not enlarged bilateral axillary lymph nodes. ABDOMEN AND PELVIS: LIVER: Portal edema. Multiple vague pericentimeter hepatic lesions more prominent along the left hepatic lobe (2:27). Enlarged liver measuring up to 22 cm. GALLBLADDER AND BILE DUCTS: Status post cholecystectomy. No biliary ductal dilatation. 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 PANCREAS: The pancreas is unremarkable. ADRENAL GLANDS: Bilateral adrenal glands demonstrate no acute abnormality. KIDNEYS, URETERS AND BLADDER: No stones in the kidneys or ureters. No hydronephrosis. No perinephric or periureteral stranding. Urinary bladder is unremarkable. GI AND BOWEL: Stomach and duodenal sweep demonstrate no acute abnormality. There is no bowel obstruction. No abnormal bowel wall thickening or distension. The appendix is not definitely identified, with no inflammatory changes in the right lower quadrant to suggest acute appendicitis. REPRODUCTIVE: Reproductive organs are unremarkable. PERITONEUM AND RETROPERITONEUM: No ascites or free air. LYMPH NODES: No  lymphadenopathy. ABDOMINAL BONES AND SOFT TISSUES: Slight heterogeneity of the axial and appendicular skeleton likely related to sickle cell disease. No acute soft tissue abnormality. IMPRESSION: 1. Interval increase in size of a right lower lobe segmental and subsegmental pulmonary embolus, with possible new left upper lobe subsegmental pulmonary embolus. No pulmonary infarction. Chronic right heart failure with no findings to suggest worsened acute right heart strain. 2. Persistent mosaic attenuation and interlobular septal thickening of the lungs with superimposed interval increase of patchy ground glass airspace opacities that are most prominent along the right middle lobe has been questioned for infection /inflammation . 3. Enlarged main pulmonary artery measuring up to 4 cm. Finding consistent with pulmonary hypertension. 4. Hepatomegaly with multiple vague pericentimeter hepatic lesions more prominent along the left hepatic lobe and portal edema. Question developing nutmeg liver morphology. 5. Stable and large bilateral hilar and mediastinal lymph nodes, as well as prominence but not enlarged bilateral axillary lymph nodes. 6. Other, non-acute and/or normal  findings as above. Electronically signed by: Morgane Naveau MD 02/08/2024 03:45 AM EDT RP Workstation: [id number]   CT ABDOMEN PELVIS W CONTRAST Result Date: 02/08/2024 EXAM: CTA CHEST, ABDOMEN AND PELVIS WITHOUT AND WITH CONTRAST 02/08/2024 03:23:01 AM TECHNIQUE: CTA of the chest was performed without and with the administration of intravenous contrast. CTA of the abdomen and pelvis was performed without and with the administration of intravenous contrast. Multiplanar reformatted images are provided for review. MIP images are provided for review. Automated exposure control, iterative reconstruction, and/or weight based adjustment of the mA/kV was utilized to reduce the radiation dose to as low as reasonably achievable. COMPARISON: None available.  CLINICAL HISTORY: Pulmonary embolism (PE) suspected, high prob. Placed patient on 4L [place]. Patient is normally on this amount when he is in crisis. O2 is staying around 90-92 which patient states he stays in the low 90s normally. 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 FINDINGS: VASCULATURE: AORTA: No acute finding. No abdominal aortic aneurysm. No dissection. PULMONARY ARTERIES: The main pulmonary artery is enlarged in caliber measuring up to 4 cm. Interval increase in size of a right lower lobe segmental and subsegmental pulmonary embolus (11:227). Question interval development of a left upper lobe subsegmental pulmonary embolus (11:100). GREAT VESSELS OF AORTIC ARCH: No acute finding. No dissection. No arterial occlusion or significant stenosis. CELIAC TRUNK: No acute finding. No occlusion or significant stenosis. SUPERIOR MESENTERIC ARTERY: No acute finding. No occlusion or significant stenosis. INFERIOR MESENTERIC ARTERY: No acute finding. No occlusion or significant stenosis. RENAL ARTERIES: No acute finding. No occlusion or significant stenosis. ILIAC ARTERIES: No acute finding. No occlusion or significant stenosis. CHEST: MEDIASTINUM: Stable Enlarged right hilar lymph nodes measuring up to 2.3 cm. Stable Enlarged left hilar lymph nodes measuring up to 1 cm. Stable Enlarged prevascular lymph node measuring up to 1.2 cm. Stable enlarged right heart Otherwise heart and pericardium demonstrate no acute abnormality. LUNGS AND PLEURA: Chronic architectural distortion. Mild paraseptal emphysematous changes. Persistent interlobular septal wall thickening with mosaic attenuation of the lungs. Interval increase of patchy ground glass airspace opacities that are most prominent along the right middle lobe. Interlobular septal wall thickening. No pulmonary infarction. No evidence of pleural effusion or pneumothorax. THORACIC BONES AND SOFT TISSUES: Slight  heterogeneity of the axial and appendicular skeleton likely related to sickle cell disease. Prominent but not enlarged bilateral axillary lymph nodes. ABDOMEN AND PELVIS: LIVER: Portal edema. Multiple vague pericentimeter hepatic lesions more prominent along the left hepatic lobe (2:27). Enlarged liver measuring up to 22 cm. GALLBLADDER AND BILE DUCTS: Status post cholecystectomy. No biliary ductal dilatation. 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 PANCREAS: The pancreas is unremarkable. ADRENAL GLANDS: Bilateral adrenal glands demonstrate no acute abnormality. KIDNEYS, URETERS AND BLADDER: No stones in the kidneys or ureters. No hydronephrosis. No perinephric or periureteral stranding. Urinary bladder is unremarkable. GI AND BOWEL: Stomach and duodenal sweep demonstrate no acute abnormality. There is no bowel obstruction. No abnormal bowel wall thickening or distension. The appendix is not definitely identified, with no inflammatory changes in the right lower quadrant to suggest acute appendicitis. REPRODUCTIVE: Reproductive organs are unremarkable. PERITONEUM AND RETROPERITONEUM: No ascites or free air. LYMPH NODES: No lymphadenopathy. ABDOMINAL BONES AND SOFT TISSUES: Slight heterogeneity of the axial and appendicular skeleton likely related to sickle cell disease. No acute soft tissue abnormality. IMPRESSION: 1. Interval increase in size of a right lower lobe segmental  and subsegmental pulmonary embolus, with possible new left upper lobe subsegmental pulmonary embolus. No pulmonary infarction. Chronic right heart failure with no findings to suggest worsened acute right heart strain. 2. Persistent mosaic attenuation and interlobular septal thickening of the lungs with superimposed interval increase of patchy ground glass airspace opacities that are most prominent along the right middle lobe has been questioned for infection /inflammation . 3. Enlarged main  pulmonary artery measuring up to 4 cm. Finding consistent with pulmonary hypertension. 4. Hepatomegaly with multiple vague pericentimeter hepatic lesions more prominent along the left hepatic lobe and portal edema. Question developing nutmeg liver morphology. 5. Stable and large bilateral hilar and mediastinal lymph nodes, as well as prominence but not enlarged bilateral axillary lymph nodes. 6. Other, non-acute and/or normal findings as above. Electronically signed by: Morgane Naveau MD 02/08/2024 03:45 AM EDT RP Workstation: [id number]   DG Chest Portable 1 View Result Date: 02/08/2024 EXAM: 1 VIEW(S) XRAY OF THE CHEST 02/08/2024 12:28:00 AM COMPARISON: 01/16/2024 CLINICAL HISTORY: sob beyond baseline, chest pain. Pt having sickle cell criss, SOB and chest pain. FINDINGS: LINES, TUBES AND DEVICES: Similar positioning of the right chest port-a-cath with loop catheter tubing at the thoracic inlet. The tip is in the low SVC. LUNGS AND PLEURA: Prominent central pulmonary arteries. Similar bilateral interstitial coarsening. Question vague ground-glass opacities in the mid lungs. Right apical pleural parenchymal scarring. No pulmonary edema. No pleural effusion. No pneumothorax. HEART AND MEDIASTINUM: Stable cardiomediastinal silhouette. BONES AND SOFT TISSUES: No acute osseous abnormality. IMPRESSION: 1. Similar interstitial coarsening with possible vague mid-lung ground-glass opacities. This may be projectional artifact, or due to edema or infection. 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 He is on home oxygen  ~4L currently requiring 8L this admission. Reporting worsening DOE the past month.  Currently  admitted with acute on chronic PE. Receiving SubQ heparin .  Echocardiogram this admission shows severely enlarged RV with mildly reduced function. His PASP is markedly elevated to 94.9 mmHg. IV septum is flattened consistent with volume overload/RV pressure.  Suspect most likely 2/2 to PE and chronic respiratory failure.  His blood pressure is low-normal. And JVD with RUQ tenderness on exam.   Reported poor urine output with diuresis thus far this admission. Though UOP 2.8L yesterday. His LFTs are up and he has a notable AKI. While this could be explained by his SS crisis, however difficult to assess if actually from his RHF. Will discuss with MD about RHC for further evaluation and optimized diuresis with his hypotension.  Would be judicious about IV fluid usage, though this will be difficult with patients current SS crisis.   Continue SubQ heparin  Agree with change to pradaxa for anticoagulation for financial reasons  Moderate MR Moderate-Severe TR Would optimize patient and improve volume status, reassess on follow-up echocardiogram. In the future may need TEE.   Per primary SS crisis Acute PE Leukocytosis HIV Chronic respiratory failure CAD   Risk Assessment/Risk Scores:     For questions or updates, please contact [facility] Please consult [email or web address] for contact info under      Signed, Leontine LOISE Salen, PA-C  02/10/2024 10:33 AM

## 2024-02-11 ENCOUNTER — Other Ambulatory Visit (HOSPITAL_COMMUNITY): Payer: Self-pay

## 2024-02-11 ENCOUNTER — Other Ambulatory Visit: Payer: Self-pay | Admitting: Physician Assistant

## 2024-02-11 DIAGNOSIS — I272 Pulmonary hypertension, unspecified: Secondary | ICD-10-CM

## 2024-02-11 DIAGNOSIS — I2699 Other pulmonary embolism without acute cor pulmonale: Secondary | ICD-10-CM | POA: Diagnosis not present

## 2024-02-11 DIAGNOSIS — D5701 Hb-SS disease with acute chest syndrome: Secondary | ICD-10-CM

## 2024-02-11 DIAGNOSIS — D5700 Hb-SS disease with crisis, unspecified: Secondary | ICD-10-CM

## 2024-02-11 DIAGNOSIS — I50810 Right heart failure, unspecified: Secondary | ICD-10-CM

## 2024-02-11 DIAGNOSIS — I2609 Other pulmonary embolism with acute cor pulmonale: Secondary | ICD-10-CM | POA: Diagnosis not present

## 2024-02-11 LAB — BASIC METABOLIC PANEL WITH GFR
Anion gap: 9 (ref 5–15)
BUN: 26 mg/dL — ABNORMAL HIGH (ref 6–20)
CO2: 24 mmol/L (ref 22–32)
Calcium: 8.5 mg/dL — ABNORMAL LOW (ref 8.9–10.3)
Chloride: 102 mmol/L (ref 98–111)
Creatinine, Ser: 1.75 mg/dL — ABNORMAL HIGH (ref 0.61–1.24)
GFR, Estimated: 52 mL/min — ABNORMAL LOW (ref >60)
Glucose, Bld: 103 mg/dL — ABNORMAL HIGH (ref 70–99)
Potassium: 4.8 mmol/L (ref 3.5–5.1)
Sodium: 135 mmol/L (ref 135–145)

## 2024-02-11 LAB — CBC
HCT: 18.9 % — ABNORMAL LOW (ref 39.0–52.0)
Hemoglobin: 6.9 g/dL — CL (ref 13.0–17.0)
MCH: 35.2 pg — ABNORMAL HIGH (ref 26.0–34.0)
MCHC: 36.5 g/dL — ABNORMAL HIGH (ref 30.0–36.0)
MCV: 96.4 fL (ref 80.0–100.0)
Platelets: 209 K/uL (ref 150–400)
RBC: 1.96 MIL/uL — ABNORMAL LOW (ref 4.22–5.81)
RDW: 24.5 % — ABNORMAL HIGH (ref 11.5–15.5)
WBC: 11.8 K/uL — ABNORMAL HIGH (ref 4.0–10.5)
nRBC: 49.6 % — ABNORMAL HIGH (ref 0.0–0.2)

## 2024-02-11 LAB — PREPARE RBC (CROSSMATCH)

## 2024-02-11 MED ORDER — SODIUM CHLORIDE 0.9% IV SOLUTION
Freq: Once | INTRAVENOUS | Status: DC
Start: 2024-02-11 — End: 2024-02-12

## 2024-02-11 NOTE — Progress Notes (Signed)
 Patient ID: Martin [name], male   DOB: 1990-11-27, 33 y.o.   MRN: [id number] Subjective: Martin [name]  is a 33 y.o. male with history of sickle cell disease, history of acute chest syndrome, multiple sickle cell crisis, multiple ED visits and hospital admission, history of PE currently on Xerelto, chronic hypoxic respiratory failure on home oxygen , HIV infection and generalized anxiety disorder who presented to the ED with major complaints of generalized body pain, SOB and left side chest pain that is not consistent with his typical sickle cell pain crisis.  Patient has no new complaint today.  He said his pain is still unbearable but slightly better than yesterday.  His pain is still at about 7/10 today.  His nausea has improved.  Shortness of breath also slightly improved, but he continues to have significant pain especially on inspiration.  He denies any fever.  No cough.  No vomiting or diarrhea.  No urinary symptoms.  Objective:  Vital signs in last 24 hours:  Vitals:   02/11/24 0600 02/11/24 0748 02/11/24 1133 02/11/24 1229  BP:  110/70  108/72  Pulse:  (!) 103  (!) 106  Resp:  (!) 21 19 17   Temp:  98.2 F (36.8 C)  98.4 F (36.9 C)  TempSrc:  Oral  Oral  SpO2: 94% 94% 94% 92%  Weight:      Height:        Intake/Output from previous day:   Intake/Output Summary (Last 24 hours) at 02/11/2024 1454 Last data filed at 02/11/2024 1150 Gross per 24 hour  Intake 1020 ml  Output 1300 ml  Net -280 ml    Physical Exam: General: Alert, awake, oriented x3, in no acute distress.  HEENT: /AT PEERL, EOMI Neck: Trachea midline,  no masses, no thyromegal,y no JVD, no carotid bruit OROPHARYNX:  Moist, No exudate/ erythema/lesions.  Heart: Regular rate and rhythm, without murmurs, rubs, gallops, PMI non-displaced, no heaves or thrills on palpation.  Lungs: Improving crackles on the right lung fields.  No wheezing or rhonchi noted. No increased vocal fremitus resonant to  percussion  Abdomen: Soft, nontender, nondistended, positive bowel sounds, no masses no hepatosplenomegaly noted..  Neuro: No focal neurological deficits noted cranial nerves II through XII grossly intact. DTRs 2+ bilaterally upper and lower extremities. Strength 5 out of 5 in bilateral upper and lower extremities. Musculoskeletal: No warm swelling or erythema around joints, no spinal tenderness noted. Psychiatric: Patient alert and oriented x3, good insight and cognition, good recent to remote recall. Lymph node survey: No cervical axillary or inguinal lymphadenopathy noted.  Lab Results:  Basic Metabolic Panel:    Component Value Date/Time   NA 135 02/11/2024 0404   NA 140 01/12/2023 1607   K 4.8 02/11/2024 0404   CL 102 02/11/2024 0404   CO2 24 02/11/2024 0404   BUN 26 (H) 02/11/2024 0404   BUN 8 01/12/2023 1607   CREATININE 1.75 (H) 02/11/2024 0404   GLUCOSE 103 (H) 02/11/2024 0404   CALCIUM  8.5 (L) 02/11/2024 0404   CBC:    Component Value Date/Time   WBC 11.8 (H) 02/11/2024 0404   HGB 6.9 (LL) 02/11/2024 0404   HGB 7.4 (L) 01/12/2023 1607   HCT 18.9 (L) 02/11/2024 0404   HCT 21.7 (L) 01/12/2023 1607   PLT 209 02/11/2024 0404   PLT 321 01/12/2023 1607   MCV 96.4 02/11/2024 0404   MCV 94 01/12/2023 1607   NEUTROABS 4.4 02/08/2024 0802   NEUTROABS 3.8 01/12/2023 1607   LYMPHSABS  5.7 (H) 02/08/2024 0802   LYMPHSABS 5.6 (H) 01/12/2023 1607   MONOABS 1.3 (H) 02/08/2024 0802   EOSABS 0.1 02/08/2024 0802   EOSABS 0.2 01/12/2023 1607   BASOSABS 0.0 02/08/2024 0802   BASOSABS 0.1 01/12/2023 1607    Recent Results (from the past 240 hours)  Urine Culture     Status: None   Collection Time: 02/04/24  3:06 PM   Specimen: Urine   UR  Result Value Ref Range Status   Urine Culture, Routine Final report  Final   Organism ID, Bacteria Comment  Final    Comment: Mixed urogenital flora Less than 10,000 colonies/mL     Studies/Results: No results  found.  Medications: Scheduled Meds:  sodium chloride    Intravenous Once   abacavir -dolutegravir -lamiVUDine   1 tablet Oral Daily   Chlorhexidine  Gluconate Cloth  6 each Topical Daily   [START ON 02/13/2024] dabigatran  150 mg Oral Q12H   enoxaparin  (LOVENOX ) injection  100 mg Subcutaneous Q24H   folic acid   500 mcg Oral Daily   guaiFENesin   600 mg Oral BID   HYDROmorphone    Intravenous Q4H   hydroxyurea   2,000 mg Oral QHS   senna-docusate  2 tablet Oral QHS   sodium chloride  flush  10-40 mL Intracatheter Q12H   Continuous Infusions: PRN Meds:.acetaminophen  **OR** acetaminophen , albuterol , cyclobenzaprine , diphenhydrAMINE , gabapentin , lip balm, melatonin, naloxone  **AND** sodium chloride  flush, ondansetron  (ZOFRAN ) IV, oxyCODONE -acetaminophen  **AND** oxyCODONE , pantoprazole , polyethylene glycol, sodium chloride  flush  Consultants: Cardiologist  Procedures: Echocardiogram  Antibiotics: None  Assessment/Plan: Principal Problem:   Acute pulmonary embolism (HCC) Active Problems:   Generalized anxiety disorder   HIV (human immunodeficiency virus infection) (HCC)   Chronic pain syndrome   Leukocytosis   Acute on chronic respiratory failure with hypoxia (HCC)   AKI (acute kidney injury)   Sickle cell pain crisis (HCC)  Hb Sickle Cell Disease with Pain crisis: Patient still in significant pain, generalized but mostly in his right chest area.  Continue IVF at KVO.  Continue weight based Dilaudid  PCA, no Toradol  due to AKI, continue oral home pain medications as ordered. Monitor vitals very closely, Re-evaluate pain scale regularly, 2 L of Oxygen  by Martin [name]. Patient encouraged to ambulate on the hallway today.  Acute pulmonary embolism: Patient is now on Pradaxa 150 mg p.o. twice daily.  Patient still has occasional chest pain especially on the right side.  Continue pain management.  Continue oxygen  supplement. Pulmonary hypertension: Appreciate cardiology input.  Patient will need right  heart catheterization as outpatient.  He is to schedule appointment with heart failure clinic after discharge.  Maximize anticoagulation. Leukocytosis: Elevated, multifactorial.  Will continue to monitor without antibiotics. Anemia of Chronic Disease: Hemoglobin is down today, 6.9.  Will transfuse patient with 1 unit of packed red blood cells to bring hemoglobin to baseline of between 7 and 8.  Continue to monitor very closely. Chronic pain Syndrome: Continue oral home pain medications as ordered. HIV infection: Asymptomatic.  Clinically stable.  Continue home medication. Chronic hypoxic respiratory failure: Patient still requiring supplemental oxygen  at about 4 to 6 L by nasal cannula.  Intensify incentive spirometry.  Patient encouraged to ambulate. Generalized anxiety disorder: Clinically stable.  Patient denies any suicidal ideations or thoughts.  Will continue his home medications.  Patient counseled extensively.  Code Status: Full Code Family Communication: N/A Disposition Plan: Not yet ready for discharge  Martin [name]  If 7PM-7AM, please contact night-coverage.  02/11/2024, 2:54 PM  LOS: 3 days

## 2024-02-11 NOTE — Progress Notes (Signed)
 Rounding Note   Patient Name: Martin [name] Date of Encounter: 02/11/2024  [facility] Cardiologist: None   Subjective  Breathing slowly improving. Pain under control.   Scheduled Meds:  abacavir -dolutegravir -lamiVUDine   1 tablet Oral Daily   Chlorhexidine  Gluconate Cloth  6 each Topical Daily   [START ON 02/13/2024] dabigatran  150 mg Oral Q12H   enoxaparin  (LOVENOX ) injection  100 mg Subcutaneous Q24H   folic acid   500 mcg Oral Daily   guaiFENesin   600 mg Oral BID   HYDROmorphone    Intravenous Q4H   hydroxyurea   2,000 mg Oral QHS   senna-docusate  2 tablet Oral QHS   sodium chloride  flush  10-40 mL Intracatheter Q12H   Continuous Infusions:  PRN Meds: acetaminophen  **OR** acetaminophen , albuterol , cyclobenzaprine , diphenhydrAMINE , gabapentin , lip balm, melatonin, naloxone  **AND** sodium chloride  flush, ondansetron  (ZOFRAN ) IV, oxyCODONE -acetaminophen  **AND** oxyCODONE , pantoprazole , polyethylene glycol, sodium chloride  flush   Vital Signs  Vitals:   02/11/24 0348 02/11/24 0555 02/11/24 0600 02/11/24 0748  BP:  112/66    Pulse:  98    Resp: 18 19  (!) 21  Temp:  99.2 F (37.3 C)    TempSrc:  Oral    SpO2: 95% 90% 94% 94%  Weight:      Height:        Intake/Output Summary (Last 24 hours) at 02/11/2024 1013 Last data filed at 02/11/2024 0730 Gross per 24 hour  Intake 1020 ml  Output 1000 ml  Net 20 ml      02/10/2024    4:22 AM 02/08/2024    4:30 PM 02/04/2024    1:15 PM  Last 3 Weights  Weight (lbs) 139 lb 12.4 oz 139 lb 8.1 oz 140 lb  Weight (kg) 63.4 kg 63.28 kg 63.504 kg      Telemetry NSR without significant ventricular ectopy - Personally Reviewed  ECG  NSR with LVH and TWI in the anterior leas - Personally Reviewed  Physical Exam  GEN: No acute distress.   Neck: No JVD Cardiac: RRR, no murmurs, rubs, or gallops.  Respiratory: Clear to auscultation bilaterally. GI: Soft, nontender, non-distended  MS: No edema; No  deformity. Neuro:  Nonfocal  Psych: Normal affect   Labs High Sensitivity Troponin:  No results for input(s): TROPONINIHS in the last 720 hours.   Chemistry Recent Labs  Lab 02/08/24 0038 02/08/24 0520 02/09/24 1300 02/09/24 2113 02/11/24 0404  NA 137  --  135 135 135  K 4.3  --  5.8* 4.6 4.8  CL 105  --  103 103 102  CO2 22  --  20* 22 24  GLUCOSE 122*  --  107* 107* 103*  BUN 19  --  28* 27* 26*  CREATININE 1.06  --  2.19* 1.74* 1.75*  CALCIUM  8.6*  --  8.1* 8.2* 8.5*  MG  --  2.0  --   --   --   PROT 7.9  --  7.6  --   --   ALBUMIN 4.1  --  4.0  --   --   AST 59*  --  98*  --   --   ALT 22  --  55*  --   --   ALKPHOS 61  --  62  --   --   BILITOT 11.1*  --  12.6*  --   --   GFRNONAA >60  --  40* 52* 52*  ANIONGAP 11  --  12 11 9     Lipids No  results for input(s): CHOL, [name], HDL, LABVLDL, LDLCALC, CHOLHDL in the last 168 hours.  Hematology Recent Labs  Lab 02/09/24 9361 02/10/24 0513 02/11/24 0404  WBC 13.1* 11.6* 11.8*  RBC 2.20* 1.87* 1.96*  HGB 7.9* 7.2* 6.9*  HCT 22.6* 18.7* 18.9*  MCV 102.7* 100.0 96.4  MCH 35.9* 38.5* 35.2*  MCHC 35.0 38.5* 36.5*  RDW 26.3* 26.9* 24.5*  PLT 224 204 209   Thyroid No results for input(s): TSH, FREET4 in the last 168 hours.  BNPNo results for input(s): BNP, PROBNP in the last 168 hours.  DDimer No results for input(s): DDIMER in the last 168 hours.   Radiology  ECHOCARDIOGRAM COMPLETE Result Date: 02/09/2024    ECHOCARDIOGRAM REPORT   Patient Name:   Martin [name] Date of Exam: 02/09/2024 Medical Rec #:  [id number]           Height:       75.0 in Accession #:    [id number]          Weight:       139.5 lb Date of Birth:  1990/09/29           BSA:          1.883 m Patient Age:    33 years            BP:           114/78 mmHg Patient Gender: M                   HR:           97 bpm. Exam Location:  Inpatient Procedure: 2D Echo, Cardiac Doppler and Color Doppler (Both Spectral and Color             Flow Doppler were utilized during procedure). Indications:    R07.9* Chest pain, unspecified  History:        Patient has prior history of Echocardiogram examinations, most                 recent 11/08/2023. Signs/Symptoms:Murmur and Chest Pain. HIV.                 Sickle cell anemia. Severe TR. Pulmonary embolus.  Sonographer:    Ellouise Mose RDCS Referring Phys: [phone] [name] COVER IMPRESSIONS  1. Left ventricular ejection fraction, by estimation, is 65 to 70%. The left ventricle has normal function. The left ventricle has no regional wall motion abnormalities. There is mild concentric left ventricular hypertrophy. Left ventricular diastolic parameters were normal. There is the interventricular septum is flattened in systole and diastole, consistent with right ventricular pressure and volume overload.  2. Right ventricular systolic function is mildly reduced. The right ventricular size is severely enlarged. There is severely elevated pulmonary artery systolic pressure. The estimated right ventricular systolic pressure is 94.9 mmHg.  3. Right atrial size was severely dilated.  4. The mitral valve is abnormal. Moderate mitral valve regurgitation. No evidence of mitral stenosis.  5. Tricuspid valve regurgitation is moderate to severe.  6. The aortic valve is tricuspid. Aortic valve regurgitation is not visualized. No aortic stenosis is present.  7. The inferior vena cava is dilated in size with <50% respiratory variability, suggesting right atrial pressure of 15 mmHg. Comparison(s): Prior images reviewed side by side. Changes from prior study are noted. RV enlarged, RVSP higher compared to prior--now severely dilated RV with RVSP 95 mmHg. Findings communicated to Dr. Jegede. FINDINGS  Left Ventricle: Left ventricular ejection fraction,  by estimation, is 65 to 70%. The left ventricle has normal function. The left ventricle has no regional wall motion abnormalities. The left ventricular internal cavity size was normal  in size. There is  mild concentric left ventricular hypertrophy. The interventricular septum is flattened in systole and diastole, consistent with right ventricular pressure and volume overload. Left ventricular diastolic parameters were normal. Right Ventricle: The right ventricular size is severely enlarged. No increase in right ventricular wall thickness. Right ventricular systolic function is mildly reduced. There is severely elevated pulmonary artery systolic pressure. The tricuspid regurgitant velocity is 4.47 m/s, and with an assumed right atrial pressure of 15 mmHg, the estimated right ventricular systolic pressure is 94.9 mmHg. Left Atrium: Left atrial size was normal in size. Right Atrium: Right atrial size was severely dilated. Pericardium: Trivial pericardial effusion is present. Mitral Valve: Mitral valve bowing without frank prolapse. The mitral valve is abnormal. Moderate mitral valve regurgitation. No evidence of mitral valve stenosis. Tricuspid Valve: The tricuspid valve is grossly normal. Tricuspid valve regurgitation is moderate to severe. No evidence of tricuspid stenosis. Aortic Valve: The aortic valve is tricuspid. Aortic valve regurgitation is not visualized. No aortic stenosis is present. Pulmonic Valve: The pulmonic valve was normal in structure. Pulmonic valve regurgitation is trivial. No evidence of pulmonic stenosis. Aorta: The aortic root, ascending aorta, aortic arch and descending aorta are all structurally normal, with no evidence of dilitation or obstruction. Venous: The inferior vena cava is dilated in size with less than 50% respiratory variability, suggesting right atrial pressure of 15 mmHg. IAS/Shunts: There is left bowing of the interatrial septum, suggestive of elevated right atrial pressure. No atrial level shunt detected by color flow Doppler.  LEFT VENTRICLE PLAX 2D LVIDd:         4.30 cm     Diastology LVIDs:         2.60 cm     LV e' medial:    7.18 cm/s LV PW:          1.10 cm     LV E/e' medial:  9.2 LV IVS:        1.20 cm     LV e' lateral:   14.50 cm/s LVOT diam:     2.40 cm     LV E/e' lateral: 4.5 LV SV:         75 LV SV Index:   40 LVOT Area:     4.52 cm LV IVRT:       81 msec  LV Volumes (MOD) LV vol d, MOD A2C: 99.2 ml LV vol d, MOD A4C: 39.1 ml LV vol s, MOD A2C: 26.7 ml LV vol s, MOD A4C: 10.3 ml LV SV MOD A2C:     72.5 ml LV SV MOD A4C:     39.1 ml LV SV MOD BP:      48.7 ml RIGHT VENTRICLE             IVC RV S prime:     14.30 cm/s  IVC diam: 3.00 cm TAPSE (M-mode): 1.3 cm LEFT ATRIUM           Index        RIGHT ATRIUM           Index LA diam:      3.50 cm 1.86 cm/m   RA Area:     25.90 cm LA Vol (A2C): 37.6 ml 19.97 ml/m  RA Volume:   95.30 ml  50.61 ml/m LA Vol (A4C):  33.9 ml 18.00 ml/m  AORTIC VALVE             PULMONIC VALVE LVOT Vmax:   107.00 cm/s PR End Diast Vel: 2.81 msec LVOT Vmean:  67.900 cm/s LVOT VTI:    0.166 m  AORTA Ao Root diam: 3.10 cm Ao Asc diam:  3.30 cm MITRAL VALVE                  TRICUSPID VALVE MV Area (PHT): 3.99 cm       TR Peak grad:   79.9 mmHg MV Decel Time: 190 msec       TR Vmax:        447.00 cm/s MR Peak grad:    83.5 mmHg MR Mean grad:    46.0 mmHg    SHUNTS MR Vmax:         457.00 cm/s  Systemic VTI:  0.17 m MR Vmean:        317.0 cm/s   Systemic Diam: 2.40 cm MR PISA:         1.01 cm MR PISA Eff ROA: 8 mm MR PISA Radius:  0.40 cm MV E velocity: 65.75 cm/s MV A velocity: 54.80 cm/s MV E/A ratio:  1.20 Shelda Bruckner MD Electronically signed by Shelda Bruckner MD Signature Date/Time: 02/09/2024/3:51:44 PM    Final     Cardiac Studies  Echo 02/09/2024  1. Left ventricular ejection fraction, by estimation, is 65 to 70%. The  left ventricle has normal function. The left ventricle has no regional  wall motion abnormalities. There is mild concentric left ventricular  hypertrophy. Left ventricular diastolic  parameters were normal. There is the interventricular septum is flattened  in systole and  diastole, consistent with right ventricular pressure and  volume overload.   2. Right ventricular systolic function is mildly reduced. The right  ventricular size is severely enlarged. There is severely elevated  pulmonary artery systolic pressure. The estimated right ventricular  systolic pressure is 94.9 mmHg.   3. Right atrial size was severely dilated.   4. The mitral valve is abnormal. Moderate mitral valve regurgitation. No  evidence of mitral stenosis.   5. Tricuspid valve regurgitation is moderate to severe.   6. The aortic valve is tricuspid. Aortic valve regurgitation is not  visualized. No aortic stenosis is present.   7. The inferior vena cava is dilated in size with <50% respiratory  variability, suggesting right atrial pressure of 15 mmHg.   Comparison(s): Prior images reviewed side by side. Changes from prior  study are noted. RV enlarged, RVSP higher compared to prior--now severely  dilated RV with RVSP 95 mmHg. Findings communicated to Dr. Jegede.    Patient Profile   33 y.o. 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 Cardiology consulted given worsening chronic RV failure.   Assessment & Plan   Acute PE  - recurrent PE dating back to 2020. Previously on chronic Xarelto  - Echo showed EF 65-70% with no RWMA, mild concentric LVH, interventricular septal flattening during diastole consistent with RV pressure volume overload. Severely enlarged RV with mildly reduced systolic function. Severely elevated PASP 94.9 mmHg. Moderate MR. Moderate to severe TR.   - given enlarged RV in the setting of sickle cell disease and recurrent PE. 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 Long term prognosis would be poor. Will  discuss with MD, may consider referral to CHF service to consider outpatient right heart catheterization and potentially consideration of thromboembolectomy at a tertiary academic center.  - discussed with the patient who says he did miss one week of Xarelto recently due to vacation. Current plan is to switch from Xarelto to Pradaxa. However patient currently has no insurance, he says he still had 2-3 month of Eliquis , but based on clinic pharmacist note, he had PE on the Eliquis . Therefore pradaxa is likely the best option.   Sickle cell crisis: pain and anemia management per primary team. Has a right chest port since 2020  RV enlargement: PASP 94.9 mmHg: indicate poor prognosis in patient with sickle cell and recurrent PE. See #1.   Moderate MR Moderate to severe TR  AKI: Cr 1.06 -> 2.19 -> 1.74 -->1.75. Given IVF  HIV    For questions or updates, please contact [facility] Please consult [email or web address] for contact info under     Signed, Scot Ford, PA  02/11/2024, 10:13 AM

## 2024-02-11 NOTE — Discharge Instructions (Signed)
Information on my medicine - Pradaxa® (dabigatran) ° ° °Why was Pradaxa® prescribed for you? °Pradaxa® was prescribed to treat blood clots that may have been found in the veins of your legs (deep vein thrombosis) or in your lungs (pulmonary embolism) and to reduce the risk of them occurring again.  Pradaxa® will take the place of the injectable anticoagulant medication you have been receiving for this condition for the last 5-10 days. ° °What do you Need to know about PradAXa®? °Take your Pradaxa® TWICE DAILY - one capsule in the morning and one tablet in the evening with or without food.  It would be best to take the doses about the same time each day. ° °The capsules should not be broken, chewed or opened - they must be swallowed whole. ° °Do not store Pradaxa in other medication containers - once the bottle is opened the Pradaxa should be used within FOUR months; throw away any capsules that haven’t been by that time. ° °Take Pradaxa® exactly as prescribed by your doctor.  DO NOT stop taking Pradaxa® without talking to the doctor who prescribed the medication.  Refill your prescription before you run out. ° °After discharge, you should have regular check-up appointments with your healthcare provider that is prescribing your Pradaxa®.  In the future your dose may need to be changed if your kidney function or weight changes by a significant amount. ° °What do you do if you miss a dose? °If you miss a dose, take it as soon as you remember on the same day.  If your next dose is less than 6 hours away, skip the missed dose.  Do not take two doses of PRADAXA at the same time. ° °Important Safety Information °A possible side effect of Pradaxa® is bleeding. You should call your healthcare provider right away if you experience any of the following: °Bleeding from an injury or your nose that does not stop. °Unusual colored urine (red or dark brown) or unusual colored stools (red or black). °Unusual bruising for unknown  reasons. °A serious fall or if you hit your head (even if there is no bleeding). ° °Some medicines may interact with Pradaxa® and might increase your risk of bleeding or clotting while on Pradaxa®. To help avoid this, consult your healthcare provider or pharmacist prior to using any new prescription or non-prescription medications, including herbals, vitamins, non-steroidal anti-inflammatory drugs (NSAIDs) and supplements. ° °This website has more information on Pradaxa® (dabigatran): [email or web address].com. ° °

## 2024-02-12 DIAGNOSIS — I2699 Other pulmonary embolism without acute cor pulmonale: Secondary | ICD-10-CM | POA: Diagnosis not present

## 2024-02-12 LAB — CBC
HCT: 21.5 % — ABNORMAL LOW (ref 39.0–52.0)
Hemoglobin: 7.8 g/dL — ABNORMAL LOW (ref 13.0–17.0)
MCH: 34.7 pg — ABNORMAL HIGH (ref 26.0–34.0)
MCHC: 36.3 g/dL — ABNORMAL HIGH (ref 30.0–36.0)
MCV: 95.6 fL (ref 80.0–100.0)
Platelets: 219 K/uL (ref 150–400)
RBC: 2.25 MIL/uL — ABNORMAL LOW (ref 4.22–5.81)
RDW: 22.5 % — ABNORMAL HIGH (ref 11.5–15.5)
WBC: 13 K/uL — ABNORMAL HIGH (ref 4.0–10.5)
nRBC: 99.3 % — ABNORMAL HIGH (ref 0.0–0.2)

## 2024-02-12 MED ORDER — BISACODYL 10 MG RE SUPP
10.0000 mg | Freq: Once | RECTAL | Status: AC
  Administered 2024-02-12: 10 mg via RECTAL
  Filled 2024-02-12: qty 1

## 2024-02-12 NOTE — Progress Notes (Signed)
 Patient ID: Martin [name], male   DOB: Sep 07, 1990, 33 y.o.   MRN: [id number] Subjective: Martin [name]  is a 33 y.o. male with history of sickle cell disease, history of acute chest syndrome, multiple sickle cell crisis, multiple ED visits and hospital admission, history of PE currently on Xerelto, chronic hypoxic respiratory failure on home oxygen , HIV infection and generalized anxiety disorder who presented to the ED with major complaints of generalized body pain, SOB and left side chest pain that is not consistent with his typical sickle cell pain crisis.  Patient claims he is much improved today but still having occasional chest pain.  He has no new complaints.  No more nausea or vomiting.  Shortness of breath continues to improve.  No fever.  No cough.  No urinary symptoms.  Objective:  Vital signs in last 24 hours:  Vitals:   02/12/24 1549 02/12/24 1556 02/12/24 1601 02/12/24 1602  BP:   107/84   Pulse:   93   Resp: 12 (!) 21 13 14   Temp:      TempSrc:      SpO2: 92%  (!) 89% 92%  Weight:      Height:        Intake/Output from previous day:   Intake/Output Summary (Last 24 hours) at 02/12/2024 1629 Last data filed at 02/12/2024 1138 Gross per 24 hour  Intake 711.1 ml  Output 900 ml  Net -188.9 ml    Physical Exam: General: Alert, awake, oriented x3, in no acute distress.  HEENT: [place]/AT PEERL, EOMI Neck: Trachea midline,  no masses, no thyromegal,y no JVD, no carotid bruit OROPHARYNX:  Moist, No exudate/ erythema/lesions.  Heart: Regular rate and rhythm, without murmurs, rubs, gallops, PMI non-displaced, no heaves or thrills on palpation.  Lungs: Improving crackles on the right lung fields.  No wheezing or rhonchi noted. No increased vocal fremitus resonant to percussion  Abdomen: Soft, nontender, nondistended, positive bowel sounds, no masses no hepatosplenomegaly noted..  Neuro: No focal neurological deficits noted cranial nerves II through XII grossly intact.  DTRs 2+ bilaterally upper and lower extremities. Strength 5 out of 5 in bilateral upper and lower extremities. Musculoskeletal: No warm swelling or erythema around joints, no spinal tenderness noted. Psychiatric: Patient alert and oriented x3, good insight and cognition, good recent to remote recall. Lymph node survey: No cervical axillary or inguinal lymphadenopathy noted.  Lab Results:  Basic Metabolic Panel:    Component Value Date/Time   NA 135 02/11/2024 0404   NA 140 01/12/2023 1607   K 4.8 02/11/2024 0404   CL 102 02/11/2024 0404   CO2 24 02/11/2024 0404   BUN 26 (H) 02/11/2024 0404   BUN 8 01/12/2023 1607   CREATININE 1.75 (H) 02/11/2024 0404   GLUCOSE 103 (H) 02/11/2024 0404   CALCIUM  8.5 (L) 02/11/2024 0404   CBC:    Component Value Date/Time   WBC 13.0 (H) 02/12/2024 0555   HGB 7.8 (L) 02/12/2024 0555   HGB 7.4 (L) 01/12/2023 1607   HCT 21.5 (L) 02/12/2024 0555   HCT 21.7 (L) 01/12/2023 1607   PLT 219 02/12/2024 0555   PLT 321 01/12/2023 1607   MCV 95.6 02/12/2024 0555   MCV 94 01/12/2023 1607   NEUTROABS 4.4 02/08/2024 0802   NEUTROABS 3.8 01/12/2023 1607   LYMPHSABS 5.7 (H) 02/08/2024 0802   LYMPHSABS 5.6 (H) 01/12/2023 1607   MONOABS 1.3 (H) 02/08/2024 0802   EOSABS 0.1 02/08/2024 0802   EOSABS 0.2 01/12/2023 1607  BASOSABS 0.0 02/08/2024 0802   BASOSABS 0.1 01/12/2023 1607    Recent Results (from the past 240 hours)  Urine Culture     Status: None   Collection Time: 02/04/24  3:06 PM   Specimen: Urine   UR  Result Value Ref Range Status   Urine Culture, Routine Final report  Final   Organism ID, Bacteria Comment  Final    Comment: Mixed urogenital flora Less than 10,000 colonies/mL     Studies/Results: No results found.  Medications: Scheduled Meds:  abacavir -dolutegravir -lamiVUDine   1 tablet Oral Daily   [START ON 02/13/2024] dabigatran  150 mg Oral Q12H   folic acid   500 mcg Oral Daily   guaiFENesin   600 mg Oral BID   HYDROmorphone     Intravenous Q4H   hydroxyurea   2,000 mg Oral QHS   senna-docusate  2 tablet Oral QHS   sodium chloride  flush  10-40 mL Intracatheter Q12H   Continuous Infusions: PRN Meds:.acetaminophen  **OR** acetaminophen , albuterol , cyclobenzaprine , diphenhydrAMINE , gabapentin , lip balm, melatonin, naloxone  **AND** sodium chloride  flush, ondansetron  (ZOFRAN ) IV, oxyCODONE -acetaminophen  **AND** oxyCODONE , pantoprazole , polyethylene glycol, sodium chloride  flush  Consultants: Cardiologist  Procedures: Echocardiogram  Antibiotics: None  Assessment/Plan: Principal Problem:   Acute pulmonary embolism (HCC) Active Problems:   Generalized anxiety disorder   HIV (human immunodeficiency virus infection) (HCC)   Chronic pain syndrome   Leukocytosis   Acute on chronic respiratory failure with hypoxia (HCC)   AKI (acute kidney injury)   Sickle cell pain crisis (HCC)  Hb Sickle Cell Disease with Pain crisis: Pain is improving except for occasional chest pain which is described as pleuritic.  Will repeat chest x-ray today. Continue IVF at [facility].  Continue weight based Dilaudid  PCA, no Toradol  due to AKI, continue oral home pain medications as ordered. Monitor vitals very closely, Re-evaluate pain scale regularly, 2 L of Oxygen  by [place]. Patient encouraged to ambulate on the hallway today.  Acute pulmonary embolism: Patient is on Pradaxa 150 mg p.o. twice daily.  Patient still has occasional chest pain especially on the right side. Continue pain management. Continue oxygen  supplement. Pulmonary hypertension: Appreciate cardiology input. Patient will need right heart catheterization as outpatient. He is to schedule appointment with heart failure clinic after discharge. Maximize anticoagulation. Leukocytosis: Elevated up to 13.0 today, multifactorial. Will continue to monitor without antibiotics. Anemia of Chronic Disease: Patient received 1 unit of packed red blood cell yesterday, hemoglobin is now 7.8 which is right  at patient's baseline.  Continue to monitor very closely. Chronic pain Syndrome: Continue oral home pain medications as ordered. HIV infection: Asymptomatic. Clinically stable. Continue home medication. Chronic hypoxic respiratory failure: Patient still requiring supplemental oxygen  at about 4 to 6 L by nasal cannula. Intensify incentive spirometry. Patient encouraged to ambulate. Generalized anxiety disorder: Clinically stable. Patient denies any suicidal ideations or thoughts.  Will continue his home medications.  Patient counseled extensively.  Code Status: Full Code Family Communication: N/A Disposition Plan: Not yet ready for discharge  Brittney Mucha  If 7PM-7AM, please contact night-coverage.  02/12/2024, 4:29 PM  LOS: 4 days

## 2024-02-13 DIAGNOSIS — D5701 Hb-SS disease with acute chest syndrome: Secondary | ICD-10-CM | POA: Diagnosis not present

## 2024-02-13 DIAGNOSIS — I2699 Other pulmonary embolism without acute cor pulmonale: Secondary | ICD-10-CM | POA: Diagnosis not present

## 2024-02-13 DIAGNOSIS — D5700 Hb-SS disease with crisis, unspecified: Secondary | ICD-10-CM | POA: Diagnosis not present

## 2024-02-13 DIAGNOSIS — I2609 Other pulmonary embolism with acute cor pulmonale: Secondary | ICD-10-CM | POA: Diagnosis not present

## 2024-02-13 LAB — COMPREHENSIVE METABOLIC PANEL WITH GFR
ALT: 87 U/L — ABNORMAL HIGH (ref 0–44)
AST: 80 U/L — ABNORMAL HIGH (ref 15–41)
Albumin: 3.8 g/dL (ref 3.5–5.0)
Alkaline Phosphatase: 70 U/L (ref 38–126)
Anion gap: 7 (ref 5–15)
BUN: 17 mg/dL (ref 6–20)
CO2: 25 mmol/L (ref 22–32)
Calcium: 8.4 mg/dL — ABNORMAL LOW (ref 8.9–10.3)
Chloride: 105 mmol/L (ref 98–111)
Creatinine, Ser: 1.1 mg/dL (ref 0.61–1.24)
GFR, Estimated: >60 mL/min (ref >60)
Glucose, Bld: 116 mg/dL — ABNORMAL HIGH (ref 70–99)
Potassium: 4.7 mmol/L (ref 3.5–5.1)
Sodium: 138 mmol/L (ref 135–145)
Total Bilirubin: 5.6 mg/dL — ABNORMAL HIGH (ref 0.0–1.2)
Total Protein: 7.8 g/dL (ref 6.5–8.1)

## 2024-02-13 NOTE — Progress Notes (Signed)
 Patient ID: Martin [name], male   DOB: [birth year], 33 y.o.   MRN: [id number] Subjective: Martin [name]  is a 33 y.o. male with history of sickle cell disease, history of acute chest syndrome, multiple sickle cell crisis, multiple ED visits and hospital admission, history of PE currently on Xerelto, chronic hypoxic respiratory failure on home oxygen , HIV infection and generalized anxiety disorder who presented to the ED with major complaints of generalized body pain, SOB and left side chest pain that is not consistent with his typical sickle cell pain crisis.  Patient continues to feel better, saying his chest pain is now resolved but still has generalized body pain.  He said his pain is at about 6/10 this morning.  He thinks he needs 1 more day in the hospital.  Shortness of breath has improved as well.  He denies any fever, nausea, vomiting or diarrhea.  No urinary symptoms.  Patient is ambulating on the hallway without significant shortness of breath or pain.  Objective:  Vital signs in last 24 hours:  Vitals:   02/13/24 0123 02/13/24 0400 02/13/24 0731 02/13/24 0858  BP: 110/69 106/71 100/66   Pulse: (!) 104 99 83   Resp: 12 12 20  (!) 21  Temp: 98.3 F (36.8 C) 98.6 F (37 C) 98.2 F (36.8 C)   TempSrc: Oral Oral Oral   SpO2: (!) 89% (!) 89% 94% 94%  Weight:  64 kg    Height:        Intake/Output from previous day:   Intake/Output Summary (Last 24 hours) at 02/13/2024 1112 Last data filed at 02/13/2024 1045 Gross per 24 hour  Intake 840 ml  Output 1000 ml  Net -160 ml    Physical Exam: General: Alert, awake, oriented x3, in no acute distress.  HEENT: [place]/AT PEERL, EOMI Neck: Trachea midline,  no masses, no thyromegal,y no JVD, no carotid bruit OROPHARYNX:  Moist, No exudate/ erythema/lesions.  Heart: Regular rate and rhythm, without murmurs, rubs, gallops, PMI non-displaced, no heaves or thrills on palpation.  Lungs: Improving crackles on the right lung fields.  No  wheezing or rhonchi noted. No increased vocal fremitus resonant to percussion  Abdomen: Soft, nontender, nondistended, positive bowel sounds, no masses no hepatosplenomegaly noted..  Neuro: No focal neurological deficits noted cranial nerves II through XII grossly intact. DTRs 2+ bilaterally upper and lower extremities. Strength 5 out of 5 in bilateral upper and lower extremities. Musculoskeletal: No warm swelling or erythema around joints, no spinal tenderness noted. Psychiatric: Patient alert and oriented x3, good insight and cognition, good recent to remote recall. Lymph node survey: No cervical axillary or inguinal lymphadenopathy noted.  Lab Results:  Basic Metabolic Panel:    Component Value Date/Time   NA 135 02/11/2024 0404   NA 140 01/12/2023 1607   K 4.8 02/11/2024 0404   CL 102 02/11/2024 0404   CO2 24 02/11/2024 0404   BUN 26 (H) 02/11/2024 0404   BUN 8 01/12/2023 1607   CREATININE 1.75 (H) 02/11/2024 0404   GLUCOSE 103 (H) 02/11/2024 0404   CALCIUM  8.5 (L) 02/11/2024 0404   CBC:    Component Value Date/Time   WBC 13.0 (H) 02/12/2024 0555   HGB 7.8 (L) 02/12/2024 0555   HGB 7.4 (L) 01/12/2023 1607   HCT 21.5 (L) 02/12/2024 0555   HCT 21.7 (L) 01/12/2023 1607   PLT 219 02/12/2024 0555   PLT 321 01/12/2023 1607   MCV 95.6 02/12/2024 0555   MCV 94 01/12/2023 1607  NEUTROABS 4.4 02/08/2024 0802   NEUTROABS 3.8 01/12/2023 1607   LYMPHSABS 5.7 (H) 02/08/2024 0802   LYMPHSABS 5.6 (H) 01/12/2023 1607   MONOABS 1.3 (H) 02/08/2024 0802   EOSABS 0.1 02/08/2024 0802   EOSABS 0.2 01/12/2023 1607   BASOSABS 0.0 02/08/2024 0802   BASOSABS 0.1 01/12/2023 1607    Recent Results (from the past 240 hours)  Urine Culture     Status: None   Collection Time: 02/04/24  3:06 PM   Specimen: Urine   UR  Result Value Ref Range Status   Urine Culture, Routine Final report  Final   Organism ID, Bacteria Comment  Final    Comment: Mixed urogenital flora Less than 10,000  colonies/mL     Studies/Results: No results found.  Medications: Scheduled Meds:  abacavir -dolutegravir -lamiVUDine   1 tablet Oral Daily   dabigatran  150 mg Oral Q12H   folic acid   500 mcg Oral Daily   guaiFENesin   600 mg Oral BID   HYDROmorphone    Intravenous Q4H   hydroxyurea   2,000 mg Oral QHS   senna-docusate  2 tablet Oral QHS   sodium chloride  flush  10-40 mL Intracatheter Q12H   Continuous Infusions: PRN Meds:.acetaminophen  **OR** acetaminophen , albuterol , cyclobenzaprine , diphenhydrAMINE , gabapentin , lip balm, melatonin, naloxone  **AND** sodium chloride  flush, ondansetron  (ZOFRAN ) IV, oxyCODONE -acetaminophen  **AND** oxyCODONE , pantoprazole , polyethylene glycol, sodium chloride  flush  Consultants: Cardiologist  Procedures: Echocardiogram  Antibiotics: None  Assessment/Plan: Principal Problem:   Acute pulmonary embolism (HCC) Active Problems:   Generalized anxiety disorder   HIV (human immunodeficiency virus infection) (HCC)   Chronic pain syndrome   Leukocytosis   Acute on chronic respiratory failure with hypoxia (HCC)   AKI (acute kidney injury)   Sickle cell pain crisis (HCC)  Hb Sickle Cell Disease with Pain crisis: Pain continues to improve but patient thinks he needs 1 more day in the hospital.  We will continue continue weight based Dilaudid  PCA, no Toradol  due to AKI, will repeat CMP today.  Continue oral home pain medications as ordered. Monitor vitals very closely, Re-evaluate pain scale regularly, 2 L of Oxygen  by [place]. Patient encouraged to ambulate on the hallway today.  Acute pulmonary embolism: Patient is on Pradaxa 150 mg p.o. twice daily.  Shortness of breath has improved.  Patient is using incentive spirometry well.  Continue pain management.  Wean down supplemental oxygen  to baseline if possible. Pulmonary hypertension: Appreciate cardiology input. Patient will need right heart catheterization as outpatient. He is to schedule appointment with heart  failure clinic after discharge. Maximize anticoagulation. Leukocytosis: Multifactorial.  No sign or symptom of inflammation or infection.  Will continue to monitor without antibiotics. Anemia of Chronic Disease: Patient received 1 unit of packed red blood cell, hemoglobin improved to 7.8 which is right at patient's baseline.  No new labs today.  Continue to monitor very closely. Chronic pain Syndrome: Continue oral home pain medications as ordered. HIV infection: Asymptomatic. Clinically stable. Continue home medication. Chronic hypoxic respiratory failure: Will begin to wean supplemental oxygen  to baseline if possible.  Continue to intensify incentive spirometry. Patient encouraged to ambulate. Generalized anxiety disorder: Clinically stable. Patient denies any suicidal ideations or thoughts.  Will continue his home medications.  Patient counseled extensively.  Code Status: Full Code Family Communication: N/A Disposition Plan: For possible discharge home tomorrow morning, 02/14/2024.  Martin [name]  If 7PM-7AM, please contact night-coverage.  02/13/2024, 11:12 AM  LOS: 5 days

## 2024-02-13 NOTE — Progress Notes (Signed)
 Rounding Note   Patient Name: Martin [name] Date of Encounter: 02/13/2024  [facility] Cardiologist: None   Subjective  Improved again today. Lungs sound better and pain is improved. Received 1UPRBC 10/24 for Hgb 6.9, yesterday 7.8.   Scheduled Meds:  abacavir -dolutegravir -lamiVUDine   1 tablet Oral Daily   dabigatran  150 mg Oral Q12H   folic acid   500 mcg Oral Daily   guaiFENesin   600 mg Oral BID   HYDROmorphone    Intravenous Q4H   hydroxyurea   2,000 mg Oral QHS   senna-docusate  2 tablet Oral QHS   sodium chloride  flush  10-40 mL Intracatheter Q12H   Continuous Infusions:  PRN Meds: acetaminophen  **OR** acetaminophen , albuterol , cyclobenzaprine , diphenhydrAMINE , gabapentin , lip balm, melatonin, naloxone  **AND** sodium chloride  flush, ondansetron  (ZOFRAN ) IV, oxyCODONE -acetaminophen  **AND** oxyCODONE , pantoprazole , polyethylene glycol, sodium chloride  flush   Vital Signs  Vitals:   02/13/24 0123 02/13/24 0400 02/13/24 0731 02/13/24 0858  BP: 110/69 106/71 100/66   Pulse: (!) 104 99 83   Resp: 12 12 20  (!) 21  Temp: 98.3 F (36.8 C) 98.6 F (37 C) 98.2 F (36.8 C)   TempSrc: Oral Oral Oral   SpO2: (!) 89% (!) 89% 94% 94%  Weight:  64 kg    Height:        Intake/Output Summary (Last 24 hours) at 02/13/2024 0944 Last data filed at 02/13/2024 0756 Gross per 24 hour  Intake 480 ml  Output 500 ml  Net -20 ml      02/13/2024    4:00 AM 02/12/2024    5:01 AM 02/10/2024    4:22 AM  Last 3 Weights  Weight (lbs) 141 lb 3.2 oz 141 lb 8.6 oz 139 lb 12.4 oz  Weight (kg) 64.048 kg 64.2 kg 63.4 kg      Telemetry NSR and intermittent ST- Personally Reviewed  ECG  NSR with LVH and TWI in the anterior leas - Personally Reviewed  Physical Exam  GEN: No acute distress.   Neck: No JVD Cardiac: RRR, no murmurs, rubs, or gallops.  Respiratory: Clear to auscultation bilaterally. GI: Soft, nontender, non-distended  MS: No edema; No  deformity. Neuro:  Nonfocal  Psych: Normal affect   Labs High Sensitivity Troponin:  No results for input(s): TROPONINIHS in the last 720 hours.   Chemistry Recent Labs  Lab 02/08/24 0038 02/08/24 0520 02/09/24 1300 02/09/24 2113 02/11/24 0404  NA 137  --  135 135 135  K 4.3  --  5.8* 4.6 4.8  CL 105  --  103 103 102  CO2 22  --  20* 22 24  GLUCOSE 122*  --  107* 107* 103*  BUN 19  --  28* 27* 26*  CREATININE 1.06  --  2.19* 1.74* 1.75*  CALCIUM  8.6*  --  8.1* 8.2* 8.5*  MG  --  2.0  --   --   --   PROT 7.9  --  7.6  --   --   ALBUMIN 4.1  --  4.0  --   --   AST 59*  --  98*  --   --   ALT 22  --  55*  --   --   ALKPHOS 61  --  62  --   --   BILITOT 11.1*  --  12.6*  --   --   GFRNONAA >60  --  40* 52* 52*  ANIONGAP 11  --  12 11 9     Lipids No results  for input(s): CHOL, [name], HDL, LABVLDL, LDLCALC, CHOLHDL in the last 168 hours.  Hematology Recent Labs  Lab 02/10/24 0513 02/11/24 0404 02/12/24 0555  WBC 11.6* 11.8* 13.0*  RBC 1.87* 1.96* 2.25*  HGB 7.2* 6.9* 7.8*  HCT 18.7* 18.9* 21.5*  MCV 100.0 96.4 95.6  MCH 38.5* 35.2* 34.7*  MCHC 38.5* 36.5* 36.3*  RDW 26.9* 24.5* 22.5*  PLT 204 209 219   Thyroid No results for input(s): TSH, FREET4 in the last 168 hours.  BNPNo results for input(s): BNP, PROBNP in the last 168 hours.  DDimer No results for input(s): DDIMER in the last 168 hours.   Radiology  No results found.   Cardiac Studies  Echo 02/09/2024  1. Left ventricular ejection fraction, by estimation, is 65 to 70%. The  left ventricle has normal function. The left ventricle has no regional  wall motion abnormalities. There is mild concentric left ventricular  hypertrophy. Left ventricular diastolic  parameters were normal. There is the interventricular septum is flattened  in systole and diastole, consistent with right ventricular pressure and  volume overload.   2. Right ventricular systolic function is mildly reduced.  The right  ventricular size is severely enlarged. There is severely elevated  pulmonary artery systolic pressure. The estimated right ventricular  systolic pressure is 94.9 mmHg.   3. Right atrial size was severely dilated.   4. The mitral valve is abnormal. Moderate mitral valve regurgitation. No  evidence of mitral stenosis.   5. Tricuspid valve regurgitation is moderate to severe.   6. The aortic valve is tricuspid. Aortic valve regurgitation is not  visualized. No aortic stenosis is present.   7. The inferior vena cava is dilated in size with <50% respiratory  variability, suggesting right atrial pressure of 15 mmHg.   Comparison(s): Prior images reviewed side by side. Changes from prior  study are noted. RV enlarged, RVSP higher compared to prior--now severely  dilated RV with RVSP 95 mmHg. Findings communicated to Dr. Jegede.    Patient Profile   33 y.o. 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 Cardiology consulted given worsening chronic RV failure.   Assessment & Plan   Acute PE  - recurrent PE dating back to 2020. Previously on chronic Xarelto  - Echo showed EF 65-70% with no RWMA, mild concentric LVH, interventricular septal flattening during diastole consistent with RV pressure volume overload. Severely enlarged RV with mildly reduced systolic function. Severely elevated PASP 94.9 mmHg. Moderate MR. Moderate to severe TR.   - given enlarged RV in the setting of sickle cell disease and recurrent PE. 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 Long term prognosis would be poor. AHF visit planned 02/20/24 for PHTN evalution, patinet agreeable. However given the degree of change of RV function from 10/2023 to now,  would consider inpatient right heart cath if we cannot get O2 closer to home O2 of 4-6L or if pain/sob persists limiting discharge. Overall, close outpatient evaluation likely acceptable given no troponin elevation with PE and possible multifactorial PHTN.  - discussed with the patient who says he did miss one week of Xarelto recently due to vacation. Current plan is to switch from Xarelto to Pradaxa. However patient currently has no insurance, he says he still had 2-3  month of Eliquis , but based on clinic pharmacist note, he had PE on the Eliquis . Therefore pradaxa is likely the best option.   Sickle cell crisis: pain and anemia management per primary team. Has a right chest port since 2020  RV enlargement: PASP 94.9 mmHg: indicate poor prognosis in patient with sickle cell and recurrent PE. See #1.   Moderate MR Moderate to severe TR  AKI: Cr 1.06 -> 2.19 -> 1.74 -->1.75. No new lab yet today. Given IVF and diuresis x 2 days.   HIV - stable on home regimen per notes.   Cardiology will follow peripherally as patient is in hospital to ensure no indication for inpt RHC.     For questions or updates, please contact [facility] Please consult [email or web address] for contact info under     Signed, Soyla DELENA Merck, MD  02/13/2024, 9:44 AM

## 2024-02-14 ENCOUNTER — Other Ambulatory Visit (HOSPITAL_COMMUNITY): Payer: Self-pay

## 2024-02-14 LAB — CBC WITH DIFFERENTIAL/PLATELET
Abs Immature Granulocytes: 0.06 K/uL (ref 0.00–0.07)
Basophils Absolute: 0 K/uL (ref 0.0–0.1)
Basophils Relative: 0 %
Eosinophils Absolute: 0.1 K/uL (ref 0.0–0.5)
Eosinophils Relative: 1 %
HCT: 20.3 % — ABNORMAL LOW (ref 39.0–52.0)
Hemoglobin: 7.5 g/dL — ABNORMAL LOW (ref 13.0–17.0)
Immature Granulocytes: 1 %
Lymphocytes Relative: 40 %
Lymphs Abs: 4.1 K/uL — ABNORMAL HIGH (ref 0.7–4.0)
MCH: 35.4 pg — ABNORMAL HIGH (ref 26.0–34.0)
MCHC: 36.9 g/dL — ABNORMAL HIGH (ref 30.0–36.0)
MCV: 95.8 fL (ref 80.0–100.0)
Monocytes Absolute: 0.7 K/uL (ref 0.1–1.0)
Monocytes Relative: 7 %
Neutro Abs: 5.1 K/uL (ref 1.7–7.7)
Neutrophils Relative %: 51 %
Platelets: 211 K/uL (ref 150–400)
RBC: 2.12 MIL/uL — ABNORMAL LOW (ref 4.22–5.81)
RDW: 23.6 % — ABNORMAL HIGH (ref 11.5–15.5)
WBC: 10.1 K/uL (ref 4.0–10.5)
nRBC: 183.1 % — ABNORMAL HIGH (ref 0.0–0.2)

## 2024-02-14 MED ORDER — BISACODYL 10 MG RE SUPP
10.0000 mg | Freq: Once | RECTAL | Status: AC
  Administered 2024-02-14: 10 mg via RECTAL
  Filled 2024-02-14: qty 1

## 2024-02-14 NOTE — Progress Notes (Cosign Needed Addendum)
 Patient ID: Martin [name], male   DOB: [birth year], 33 y.o.   MRN: [id number] Subjective: Martin [name]  is a 33 y.o. male with history of sickle cell disease, history of acute chest syndrome, multiple sickle cell crisis, multiple ED visits and hospital admission, history of PE currently on Xerelto, chronic hypoxic respiratory failure on home oxygen , HIV infection and generalized anxiety disorder who presented to the ED with major complaints of generalized body pain, SOB and left side chest pain that is not consistent with his typical sickle cell pain crisis.  Patient is much improved today and endorses pain of 6-7/10 he has no new complaints.  He says he has been walking in his room.  No more nausea or vomiting.  Improved shortness of breath. oxygen  via nasal cannula titrated down to 4 L for the next 12 to 24 hours to wean off and ready for discharge in the morning.  No fever.  No cough.  No urinary symptoms.  Objective:  Vital signs in last 24 hours:  Vitals:   02/15/24 0912 02/15/24 0929 02/15/24 1235 02/15/24 1321  BP:   106/63   Pulse:   90   Resp: 12 12 18  (!) 9  Temp:      TempSrc:      SpO2: 92% 92% 91% 97%  Weight:      Height:        Intake/Output from previous day:   Intake/Output Summary (Last 24 hours) at 02/15/2024 1345 Last data filed at 02/15/2024 1144 Gross per 24 hour  Intake 240 ml  Output 2050 ml  Net -1810 ml    Physical Exam: General: Alert, awake, oriented x3, in no acute distress.  HEENT: [place]/AT PEERL, EOMI Neck: Trachea midline,  no masses, no thyromegal,y no JVD, no carotid bruit OROPHARYNX:  Moist, No exudate/ erythema/lesions.  Heart: Regular rate and rhythm, without murmurs, rubs, gallops, PMI non-displaced, no heaves or thrills on palpation.  Lungs: Improving crackles on the right lung fields.  No wheezing or rhonchi noted. No increased vocal fremitus resonant to percussion  Abdomen: Soft, nontender, nondistended, positive bowel sounds, no  masses no hepatosplenomegaly noted..  Neuro: No focal neurological deficits noted cranial nerves II through XII grossly intact. DTRs 2+ bilaterally upper and lower extremities. Strength 5 out of 5 in bilateral upper and lower extremities. Musculoskeletal: Generalized body tenderness. Psychiatric: Patient alert and oriented x3, good insight and cognition, good recent to remote recall. Lymph node survey: No cervical axillary or inguinal lymphadenopathy noted.  Lab Results:  Basic Metabolic Panel:    Component Value Date/Time   NA 138 02/13/2024 1207   NA 140 01/12/2023 1607   K 4.7 02/13/2024 1207   CL 105 02/13/2024 1207   CO2 25 02/13/2024 1207   BUN 17 02/13/2024 1207   BUN 8 01/12/2023 1607   CREATININE 1.10 02/13/2024 1207   GLUCOSE 116 (H) 02/13/2024 1207   CALCIUM  8.4 (L) 02/13/2024 1207   CBC:    Component Value Date/Time   WBC 10.1 02/14/2024 0400   HGB 7.5 (L) 02/14/2024 0400   HGB 7.4 (L) 01/12/2023 1607   HCT 20.3 (L) 02/14/2024 0400   HCT 21.7 (L) 01/12/2023 1607   PLT 211 02/14/2024 0400   PLT 321 01/12/2023 1607   MCV 95.8 02/14/2024 0400   MCV 94 01/12/2023 1607   NEUTROABS 5.1 02/14/2024 0400   NEUTROABS 3.8 01/12/2023 1607   LYMPHSABS 4.1 (H) 02/14/2024 0400   LYMPHSABS 5.6 (H) 01/12/2023 1607   MONOABS  0.7 02/14/2024 0400   EOSABS 0.1 02/14/2024 0400   EOSABS 0.2 01/12/2023 1607   BASOSABS 0.0 02/14/2024 0400   BASOSABS 0.1 01/12/2023 1607    No results found for this or any previous visit (from the past 240 hours).   Studies/Results: No results found.  Medications: Scheduled Meds:  abacavir -dolutegravir -lamiVUDine   1 tablet Oral Daily   dabigatran  150 mg Oral Q12H   folic acid   500 mcg Oral Daily   guaiFENesin   600 mg Oral BID   HYDROmorphone    Intravenous Q4H   hydroxyurea   2,000 mg Oral QHS   senna-docusate  2 tablet Oral QHS   sodium chloride  flush  10-40 mL Intracatheter Q12H   Continuous Infusions: PRN Meds:.acetaminophen  **OR**  acetaminophen , albuterol , cyclobenzaprine , diphenhydrAMINE , gabapentin , lip balm, melatonin, naloxone  **AND** sodium chloride  flush, ondansetron  (ZOFRAN ) IV, oxyCODONE -acetaminophen  **AND** oxyCODONE , pantoprazole , polyethylene glycol, sodium chloride  flush  Consultants: Cardiologist [completed]  Procedures: Echocardiogram [completed]  Antibiotics: None  Assessment/Plan: Principal Problem:   Acute pulmonary embolism (HCC) Active Problems:   Generalized anxiety disorder   HIV (human immunodeficiency virus infection) (HCC)   Chronic pain syndrome   Leukocytosis   Acute on chronic respiratory failure with hypoxia (HCC)   AKI (acute kidney injury)   Sickle cell pain crisis (HCC)  Hb Sickle Cell Disease with Pain crisis: Pain  continues to improve. Continue IVF at KVO.  Continue weight based Dilaudid  PCA at current dose., no Toradol  due to AKI, continue oral home pain medications as ordered. Monitor vitals very closely, Re-evaluate pain scale regularly, 2 L of Oxygen  by [place]. Patient encouraged to ambulate on the hallway today.  Acute pulmonary embolism: Patient is on Pradaxa 150 mg p.o. twice daily.  Oxygen  decreased to 4 L via nasal cannula for the next 12 to 24 hours to wean patient from 6 L.  Continue oxygen  supplement Pulmonary hypertension: Appreciate cardiology input. Patient will need right heart catheterization as outpatient. He is to schedule appointment with heart failure clinic after discharge. Maximize anticoagulation. Leukocytosis: Resolved Anemia of Chronic Disease: Patient received 1 unit of packed red blood cell, hemoglobin is now 7. 5 which is at patient's baseline.  Continue to monitor very closely. Chronic pain Syndrome: Continue oral home pain medications as ordered. HIV infection: Asymptomatic. Clinically stable. Continue home medication. Chronic hypoxic respiratory failure: Patient still requiring supplemental oxygen  at about 4 to 6 L by nasal cannula.  Patient is being  weaned off of 6 L today to 4 L.  Encouraged incentive spirometry. Patient encouraged to ambulate. Generalized anxiety disorder: Clinically stable. Patient denies any suicidal ideations or thoughts.  Will continue his home medications.  Patient counseled extensively.  Code Status: Full Code Family Communication: N/A Disposition Plan: Ready for discharge in the AM.  [name] Cover NP  If 7PM-7AM, please contact night-coverage.  02/15/2024, 1:45 PM  LOS: 7 days

## 2024-02-15 LAB — TYPE AND SCREEN
ABO/RH(D): O POS
Antibody Screen: NEGATIVE
Donor AG Type: NEGATIVE
Unit division: 0

## 2024-02-15 LAB — BPAM RBC
Blood Product Expiration Date: 202511262359
ISSUE DATE / TIME: 202510242237
Unit Type and Rh: 5100

## 2024-02-15 MED ORDER — HYDROMORPHONE 1 MG/ML IV SOLN
INTRAVENOUS | Status: DC
  Administered 2024-02-15: 1.2 mg via INTRAVENOUS
  Administered 2024-02-15: 2.1 mg via INTRAVENOUS
  Administered 2024-02-15: 1.6 mg via INTRAVENOUS
  Administered 2024-02-16: 5.7 mg via INTRAVENOUS
  Administered 2024-02-16: 3.1 mg via INTRAVENOUS
  Administered 2024-02-16: 1.8 mg via INTRAVENOUS
  Administered 2024-02-16: 2.19 mg via INTRAVENOUS
  Administered 2024-02-16: 2.1 mg via INTRAVENOUS
  Administered 2024-02-17: 1.7 mg via INTRAVENOUS
  Administered 2024-02-17: 3 mg via INTRAVENOUS
  Administered 2024-02-17: 1.2 mg via INTRAVENOUS
  Administered 2024-02-17: 2.1 mg via INTRAVENOUS
  Administered 2024-02-17: 5.4 mg via INTRAVENOUS
  Filled 2024-02-15: qty 30

## 2024-02-15 NOTE — Progress Notes (Cosign Needed Addendum)
 Patient ID: Martin [name], male   DOB: 24-Sep-1990, 33 y.o.   MRN: [id number] Subjective: Martin [name]  is a 33 y.o. male with history of sickle cell disease, history of acute chest syndrome, multiple sickle cell crisis, multiple ED visits and hospital admission, history of PE currently on Xerelto, chronic hypoxic respiratory failure on home oxygen , HIV infection and generalized anxiety disorder who presented to the ED with major complaints of generalized body pain, SOB and left side chest pain that is not consistent with his typical sickle cell pain crisis.  Patient is reporting worsening pain this morning and rates pain at 9/10, increased anxiety, nausea without vomiting.  Patient believes he is unable to go home today and is concerned that he will not be able to get the right care outside of the hospital.  Education provided to patient on nonpharmacological means of caring for his health outside of the hospital, provided social work resources as needed.  Patient is saturating at 92% on 4 L.,  Denies cough, shortness of breath, subjective fever.  No urinary symptoms.     Objective:  Vital signs in last 24 hours:  Vitals:   02/16/24 0749 02/16/24 0756 02/16/24 1249 02/16/24 1303  BP:  117/70  98/72  Pulse:  85  95  Resp: 15 18 14 20   Temp:  98 F (36.7 C)  98.2 F (36.8 C)  TempSrc:  Oral  Oral  SpO2: 92% 97% 95% 90%  Weight:      Height:        Intake/Output from previous day:   Intake/Output Summary (Last 24 hours) at 02/16/2024 1501 Last data filed at 02/16/2024 1306 Gross per 24 hour  Intake 530 ml  Output 3175 ml  Net -2645 ml    Physical Exam: General: Alert, awake, oriented x3, in no acute distress.  HEENT: [place]/AT PEERL, EOMI Neck: Trachea midline,  no masses, no thyromegal,y no JVD, no carotid bruit OROPHARYNX:  Moist, No exudate/ erythema/lesions.  Heart: Regular rate and rhythm, without murmurs, rubs, gallops, PMI non-displaced, no heaves or thrills on  palpation.  Lungs: Improving crackles on the right lung fields.  No wheezing or rhonchi noted. No increased vocal fremitus resonant to percussion  Abdomen: Soft, nontender, nondistended, positive bowel sounds, no masses no hepatosplenomegaly noted..  Neuro: No focal neurological deficits noted cranial nerves II through XII grossly intact. DTRs 2+ bilaterally upper and lower extremities. Strength 5 out of 5 in bilateral upper and lower extremities. Musculoskeletal: Generalized body tenderness. Psychiatric: Patient alert and oriented x3, good insight and cognition, good recent to remote recall. Lymph node survey: No cervical axillary or inguinal lymphadenopathy noted.  Lab Results:  Basic Metabolic Panel:    Component Value Date/Time   NA 138 02/13/2024 1207   NA 140 01/12/2023 1607   K 4.7 02/13/2024 1207   CL 105 02/13/2024 1207   CO2 25 02/13/2024 1207   BUN 17 02/13/2024 1207   BUN 8 01/12/2023 1607   CREATININE 1.10 02/13/2024 1207   GLUCOSE 116 (H) 02/13/2024 1207   CALCIUM  8.4 (L) 02/13/2024 1207   CBC:    Component Value Date/Time   WBC 6.7 02/16/2024 1157   HGB 6.8 (LL) 02/16/2024 1157   HGB 7.4 (L) 01/12/2023 1607   HCT 18.0 (L) 02/16/2024 1157   HCT 21.7 (L) 01/12/2023 1607   PLT 183 02/16/2024 1157   PLT 321 01/12/2023 1607   MCV 94.2 02/16/2024 1157   MCV 94 01/12/2023 1607   NEUTROABS 5.1  02/14/2024 0400   NEUTROABS 3.8 01/12/2023 1607   LYMPHSABS 4.1 (H) 02/14/2024 0400   LYMPHSABS 5.6 (H) 01/12/2023 1607   MONOABS 0.7 02/14/2024 0400   EOSABS 0.1 02/14/2024 0400   EOSABS 0.2 01/12/2023 1607   BASOSABS 0.0 02/14/2024 0400   BASOSABS 0.1 01/12/2023 1607    No results found for this or any previous visit (from the past 240 hours).   Studies/Results: No results found.  Medications: Scheduled Meds:  abacavir -dolutegravir -lamiVUDine   1 tablet Oral Daily   dabigatran  150 mg Oral Q12H   folic acid   500 mcg Oral Daily   guaiFENesin   600 mg Oral BID    HYDROmorphone    Intravenous Q4H   hydroxyurea   2,000 mg Oral QHS   senna-docusate  2 tablet Oral QHS   sodium chloride  flush  10-40 mL Intracatheter Q12H   Continuous Infusions: PRN Meds:.acetaminophen  **OR** acetaminophen , albuterol , cyclobenzaprine , diphenhydrAMINE , gabapentin , lip balm, melatonin, naloxone  **AND** sodium chloride  flush, ondansetron  (ZOFRAN ) IV, oxyCODONE -acetaminophen  **AND** oxyCODONE , pantoprazole , polyethylene glycol, sodium chloride  flush  Consultants: Cardiologist [completed]  Procedures: Echocardiogram [completed]  Antibiotics: None  Assessment/Plan: Principal Problem:   Acute pulmonary embolism (HCC) Active Problems:   Generalized anxiety disorder   HIV (human immunodeficiency virus infection) (HCC)   Chronic pain syndrome   Leukocytosis   Acute on chronic respiratory failure with hypoxia (HCC)   AKI (acute kidney injury)   Sickle cell pain crisis (HCC)  Hb Sickle Cell Disease with Pain crisis: Continue IVF at KVO. Continue weight based Dilaudid  PCA at current dose., no Toradol  due to AKI, continue oral home pain medications as ordered. Monitor vitals very closely, Re-evaluate pain scale regularly, 2 L of Oxygen  by [place]. Patient encouraged to ambulate on the hallway today.  Acute pulmonary embolism: Patient is on Pradaxa 150 mg p.o. twice daily. Patient is saturating well on 4 L nasal cannula. Continue oxygen  supplement Pulmonary hypertension: Appreciate cardiology input. Patient will need right heart catheterization as outpatient. He is to schedule appointment with heart failure clinic after discharge. Maximize anticoagulation. Leukocytosis: Resolved Anemia of Chronic Disease: Patient received 1 unit of packed red blood cell, hemoglobin is now 7.5 which is at patient's baseline.  Will continue to monitor very closely. Chronic pain Syndrome: Continue oral home pain medications as ordered. HIV infection: Asymptomatic. Clinically stable. Continue home  medication. Chronic hypoxic respiratory failure: Patient still requiring supplemental oxygen  at about 4 to 6 L by nasal cannula.  Patient is being weaned off of 6 L today to 4 L.  Intensify incentive spirometry. Patient encouraged to ambulate. Generalized anxiety disorder: Patient denies any suicidal ideations or thoughts.  Will continue his home medications.  Patient counseled extensively.  Code Status: Full Code Family Communication: N/A Disposition Plan: Ready for discharge in the AM.  [name] Cover NP  If 7PM-7AM, please contact night-coverage.  02/16/2024, 3:01 PM  LOS: 8 days

## 2024-02-15 NOTE — Plan of Care (Signed)
?  Problem: Education: ?Goal: Knowledge of vaso-occlusive preventative measures will improve ?Outcome: Progressing ?Goal: Awareness of infection prevention will improve ?Outcome: Progressing ?Goal: Awareness of signs and symptoms of anemia will improve ?Outcome: Progressing ?Goal: Long-term complications will improve ?Outcome: Progressing ?  ?Problem: Self-Care: ?Goal: Ability to incorporate actions that prevent/reduce pain crisis will improve ?Outcome: Progressing ?  ?

## 2024-02-15 NOTE — TOC Progression Note (Signed)
 Transition of Care [facility]) - Progression Note    Patient Details  Name: Martin [name] MRN: [id number] Date of Birth: Feb 11, 1991  Transition of Care [facility]) CM/SW Contact  Tawni CHRISTELLA Eva, LCSW Phone Number: 02/15/2024, 2:21 PM  Clinical Narrative:     CSW received a consult for home health and DME needs. Pt reports that he has home oxygen  arranged and a portable tank available for discharge. He denies any medication or additional DME needs. Pt has out of state Medicaid and does not qualify for [facility] program.    Pt stated he needs assistance with housing. CSW explained that the hospital has already provided all available housing resources during his previous visit and that no new resources are currently available. Pt reports he is currently residing with his sister and will have transportation at the time of discharge. No further ICM needs, Care management signing off.  Expected Discharge Plan: Home/Self Care Barriers to Discharge: Continued Medical Work up          Expected Discharge Plan and Services In-house Referral: NA Discharge Planning Services: CM Consult, MATCH Program, Medication Assistance Post Acute Care Choice: NA Living arrangements for the past 2 months: Homeless (sometimes can stay with family and friends)                 DME Arranged: N/A DME Agency: NA       HH Arranged: NA HH Agency: NA         Social Drivers of Health (SDOH) Interventions SDOH Screenings   Food Insecurity: No Food Insecurity (02/08/2024)  Housing: High Risk (02/08/2024)  Transportation Needs: No Transportation Needs (02/08/2024)  Utilities: Not At Risk (02/08/2024)  Depression (PHQ2-9): Low Risk  (02/04/2024)  Financial Resource Strain: Low Risk  (10/09/2023)   Received from [facility]  Recent Concern: Financial Resource Strain - Medium Risk (08/03/2023)  Physical Activity: Sufficiently Active (10/09/2023)   [facility]  Social Connections:  Socially Isolated (01/04/2024)  Stress: No Stress Concern Present (10/09/2023)   Received from [facility]  Tobacco Use: Low Risk  (02/08/2024)    Readmission Risk Interventions    02/09/2024    3:42 PM 01/06/2024   12:15 PM 12/22/2023    4:14 PM  Readmission Risk Prevention Plan  Transportation Screening Complete Complete Complete  Medication Review [employer]) Complete Complete Complete  PCP or Specialist appointment within 3-5 days of discharge Complete Complete Complete  HRI or Home Care Consult Complete Complete Complete  SW Recovery Care/Counseling Consult Complete Complete Complete  Palliative Care Screening Not Applicable Not Applicable Not Applicable  Skilled Nursing Facility Not Applicable Not Applicable Not Applicable

## 2024-02-16 ENCOUNTER — Other Ambulatory Visit (HOSPITAL_COMMUNITY): Payer: Self-pay

## 2024-02-16 LAB — CBC
HCT: 18 % — ABNORMAL LOW (ref 39.0–52.0)
Hemoglobin: 6.8 g/dL — CL (ref 13.0–17.0)
MCH: 35.6 pg — ABNORMAL HIGH (ref 26.0–34.0)
MCHC: 37.8 g/dL — ABNORMAL HIGH (ref 30.0–36.0)
MCV: 94.2 fL (ref 80.0–100.0)
Platelets: 183 K/uL (ref 150–400)
RBC: 1.91 MIL/uL — ABNORMAL LOW (ref 4.22–5.81)
RDW: 23.9 % — ABNORMAL HIGH (ref 11.5–15.5)
WBC: 6.7 K/uL (ref 4.0–10.5)
nRBC: 112.7 % — ABNORMAL HIGH (ref 0.0–0.2)

## 2024-02-16 MED ORDER — BISACODYL 10 MG RE SUPP
10.0000 mg | Freq: Every day | RECTAL | Status: DC | PRN
Start: 2024-02-16 — End: 2024-02-17
  Administered 2024-02-16: 10 mg via RECTAL
  Filled 2024-02-16: qty 1

## 2024-02-16 MED ORDER — DABIGATRAN ETEXILATE MESYLATE 150 MG PO CAPS
150.0000 mg | ORAL_CAPSULE | Freq: Two times a day (BID) | ORAL | 1 refills | Status: DC
  Filled 2024-02-16: qty 60, 30d supply, fill #0

## 2024-02-16 NOTE — Plan of Care (Signed)
?  Problem: Education: ?Goal: Knowledge of vaso-occlusive preventative measures will improve ?Outcome: Progressing ?Goal: Awareness of infection prevention will improve ?Outcome: Progressing ?Goal: Awareness of signs and symptoms of anemia will improve ?Outcome: Progressing ?Goal: Long-term complications will improve ?Outcome: Progressing ?  ?Problem: Self-Care: ?Goal: Ability to incorporate actions that prevent/reduce pain crisis will improve ?Outcome: Progressing ?  ?

## 2024-02-16 NOTE — Plan of Care (Incomplete)
  Problem: Education: Goal: Knowledge of vaso-occlusive preventative measures will improve Outcome: Progressing Goal: Awareness of infection prevention will improve Outcome: Progressing Goal: Awareness of signs and symptoms of anemia will improve Outcome: Progressing Goal: Long-term complications will improve Outcome: Progressing   Problem: Respiratory: Goal: Pulmonary complications will be avoided or minimized Outcome: Progressing Goal: Acute Chest Syndrome will be identified early to prevent complications Outcome: Progressing   Problem: Fluid Volume: Goal: Ability to maintain a balanced intake and output will improve Outcome: Progressing   Problem: Sensory: Goal: Pain level will decrease with appropriate interventions Outcome: Progressing

## 2024-02-16 NOTE — Discharge Summary (Signed)
 Physician Discharge Summary  Martin [name] FMW:[id number] DOB: Dec 10, 1990 DOA: 02/07/2024  PCP: [name], [name], [name]  Admit date: 02/07/2024  Discharge date: 02/16/2024  Discharge Diagnoses:  Principal Problem:   Acute pulmonary embolism (HCC) Active Problems:   Generalized anxiety disorder   HIV (human immunodeficiency virus infection) (HCC)   Chronic pain syndrome   Leukocytosis   Acute on chronic respiratory failure with hypoxia (HCC)   AKI (acute kidney injury)   Sickle cell pain crisis [facility])   Discharge Condition: Stable  Disposition:   Follow-up Information     Zenaida Morene PARAS, MD Follow up on 03/01/2024.   Specialty: Cardiology Why: 2:20PM. Heart Failure evaluation. Parking Code 1520 Contact information: [address] [place] [address] [phone]                Pt is discharged home in good condition and is to follow up with [name], [name], [name] this week to have labs evaluated. Martin [name] is instructed to increase activity slowly and balance with rest for the next few days, and use prescribed medication to complete treatment of pain  Diet: Regular Wt Readings from Last 3 Encounters:  02/16/24 64.6 kg  02/04/24 63.5 kg  01/31/24 63.5 kg    History of present illness:  Martin [name]  is a 33 y.o. male, with history of sickle cell disease, history of acute chest syndrome, multiple sickle cell crisis, multiple ED visits and hospital admission, history of PE currently on Eliquis , chronic hypoxic respiratory failure on home oxygen , HIV infection and generalized anxiety disorder who presented to the Emergency department with major complaints of generalized body pain, shortness of breath beyond baseline, chest pain, abdominal pain, Patient reports that symptoms are not typical of his sickle cell pain and states,  I feel like the blood clot is back. Patient's last admission he was found to have new PE despite chronic  anticoagulation patient also complained of right sided abdominal pain in the upper region. Denies fevers nausea or vomiting. Denies leg swelling, lightheadedness, dizziness or weakness. Reports that he took home oxycodone  without relief of pain. Denies dysuria. Denies flank pain.    ED Course:      Vitals:    02/09/24 1037 02/09/24 1207  BP: 114/78    Pulse: 98    Resp: 12 14  Temp: 97.7 F (36.5 C)    SpO2: 98% 94%   RBC 2.26, HGB 8.3, HCT, 22.6, MCH 36.7, Retic Ct 23.8, PT 17.1, INR 1.3 Patient was treated in the ED with IV fluid, IV dilaudid , with no resolution to symptoms. CT Abdomen, CT Chest and Chest x-ray completed.  Patient was admitted for on going sickle cell pain management.     Hospital Course:  Patient was admitted for Acute pulmonary embolism in the setting of sickle cell pain crisis and managed appropriately with IVF, IV Dilaudid  via PCA. While on admission patient was transfused with 1 unit of PRBCs for HBG of 6.9. patient tolerated infusion well without transfusion reaction. Hgb is at baseline today, patient is able to ambulate without assistance and saturating 92% on 4L oxygen  with significant improvement to pain. He is will be on Pradaxa since he is unable to afford Xarelto. RX and refill sent [facility] on discharge. Patient is aware of the importance of anticoagulation compliance.  Patient was therefore discharged home today in a hemodynamically stable condition.   Martin [name] will follow-up with PCP within 1 week of this discharge. Martin [name] was  counseled extensively about nonpharmacologic means of pain management, patient verbalized understanding and was appreciative of  the care received during this admission.  We discussed the need for good hydration, monitoring of hydration status, avoidance of heat, cold, stress, and infection triggers. We discussed the need to be adherent with taking his home medications. Patient was reminded of the need to seek medical  attention immediately if any symptom of bleeding, anemia, or infection occurs.  Discharge Exam: Vitals:   02/16/24 0749 02/16/24 0756  BP:  117/70  Pulse:  85  Resp: 15 18  Temp:  98 F (36.7 C)  SpO2: 92% 97%   Vitals:   02/16/24 0500 02/16/24 0505 02/16/24 0749 02/16/24 0756  BP:  104/70  117/70  Pulse:  95  85  Resp:  16 15 18   Temp:  98.4 F (36.9 C)  98 F (36.7 C)  TempSrc:  Oral  Oral  SpO2:  91% 92% 97%  Weight: 64.6 kg     Height:        General appearance : Awake, alert, not in any distress. Speech Clear. Not toxic looking HEENT: Atraumatic and Normocephalic, pupils equally reactive to light and accomodation Neck: Supple, no JVD. No cervical lymphadenopathy.  Chest: Good air entry bilaterally, no added sounds  CVS: S1 S2 regular, no murmurs.  Abdomen: Bowel sounds present, Non tender and not distended with no gaurding, rigidity or rebound. Extremities: B/L Lower Ext shows no edema, both legs are warm to touch Neurology: Awake alert, and oriented X 3, CN II-XII intact, Non focal Skin: No Rash  Discharge Instructions   Allergies as of 02/16/2024       Reactions   Ketorolac  Tromethamine  Swelling, Other (See Comments)   Patient reports facial edema and left arm edema after administration.   Tape Rash, Other (See Comments)   PLEASE DO NOT USE THE CLEAR, THICK, PLASTIC TAPE- only paper tape is tolerated    Wound Dressing Adhesive Rash   Bactoshield Chg [chlorhexidine  Gluconate]    Per patient it makes me breakout        Medication List     STOP taking these medications    Rivaroxaban Starter Pack (15 mg and 20 mg) Commonly known as: XARELTO STARTER PACK       TAKE these medications    albuterol  108 (90 Base) MCG/ACT inhaler Commonly known as: VENTOLIN  HFA Inhale 2 puffs into the lungs every 6 (six) hours as needed for wheezing or shortness of breath.   cyclobenzaprine  5 MG tablet Commonly known as: FLEXERIL  Take 1 tablet (5 mg total) by  mouth 2 (two) times daily as needed for muscle spasms.   dabigatran 150 MG Caps capsule Commonly known as: Pradaxa Take 1 capsule (150 mg total) by mouth 2 (two) times daily.   dabigatran 150 MG Caps capsule Commonly known as: PRADAXA Take 1 capsule (150 mg total) by mouth every 12 (twelve) hours.   Deferasirox  360 MG Tabs Commonly known as: Jadenu  Take 2 tablets (720 mg total) by mouth daily.   diphenhydrAMINE  25 MG tablet Commonly known as: BENADRYL  Take 1 tablet (25 mg total) by mouth every 4 (four) hours as needed for itching.   folic acid  400 MCG tablet Commonly known as: FOLVITE  Take 1 tablet (400 mcg total) by mouth daily.   gabapentin  300 MG capsule Commonly known as: NEURONTIN  Take 1 capsule (300 mg total) by mouth at bedtime as needed (nerve pain).   guaiFENesin  600 MG 12 hr tablet Commonly known  as: Mucinex  Take 1 tablet (600 mg total) by mouth 2 (two) times daily.   hydroxyurea  500 MG capsule Commonly known as: HYDREA  Take 4 capsules (2,000 mg total) by mouth at bedtime.   Narcan  4 MG/0.1ML Liqd nasal spray kit Generic drug: naloxone  Place 1 spray into the nose as needed (respiratory distress / opioid overdose).   oxyCODONE -acetaminophen  10-325 MG tablet Commonly known as: PERCOCET Take 1 tablet by mouth every 4 (four) hours as needed for pain.   pantoprazole  40 MG tablet Commonly known as: PROTONIX  Take 1 tablet (40 mg total) by mouth daily as needed (acid reflux).   senna-docusate 8.6-50 MG tablet Commonly known as: Senokot-S Take 2 tablets by mouth at bedtime.   sulfamethoxazole-trimethoprim 800-160 MG tablet Commonly known as: BACTRIM DS Take 1 tablet by mouth 2 (two) times daily.   triamcinolone  cream 0.1 % Commonly known as: KENALOG  Apply 1 Application topically 2 (two) times daily.   Triumeq  600-50-300 MG tablet Generic drug: abacavir -dolutegravir -lamiVUDine  Take 1 tablet by mouth daily.   Vitamin D3 125 MCG (5000 UT) Caps Take 5,000  Units by mouth every Tuesday.        The results of significant diagnostics from this hospitalization (including imaging, microbiology, ancillary and laboratory) are listed below for reference.    Significant Diagnostic Studies: ECHOCARDIOGRAM COMPLETE Result Date: 02/09/2024    ECHOCARDIOGRAM REPORT   Patient Name:   TARIQ PERNELL Date of Exam: 02/09/2024 Medical Rec #:  [id number]           Height:       75.0 in Accession #:    [id number]          Weight:       139.5 lb Date of Birth:  1990/10/15           BSA:          1.883 m Patient Age:    33 years            BP:           114/78 mmHg Patient Gender: M                   HR:           97 bpm. Exam Location:  Inpatient Procedure: 2D Echo, Cardiac Doppler and Color Doppler (Both Spectral and Color            Flow Doppler were utilized during procedure). Indications:    R07.9* Chest pain, unspecified  History:        Patient has prior history of Echocardiogram examinations, most                 recent 11/08/2023. Signs/Symptoms:Murmur and Chest Pain. HIV.                 Sickle cell anemia. Severe TR. Pulmonary embolus.  Sonographer:    Ellouise Mose RDCS Referring Phys: [id number] [name] COVER IMPRESSIONS  1. Left ventricular ejection fraction, by estimation, is 65 to 70%. The left ventricle has normal function. The left ventricle has no regional wall motion abnormalities. There is mild concentric left ventricular hypertrophy. Left ventricular diastolic parameters were normal. There is the interventricular septum is flattened in systole and diastole, consistent with right ventricular pressure and volume overload.  2. Right ventricular systolic function is mildly reduced. The right ventricular size is severely enlarged. There is severely elevated pulmonary artery systolic pressure. The estimated right ventricular systolic pressure is 94.9 mmHg.  3.  Right atrial size was severely dilated.  4. The mitral valve is abnormal. Moderate mitral valve  regurgitation. No evidence of mitral stenosis.  5. Tricuspid valve regurgitation is moderate to severe.  6. The aortic valve is tricuspid. Aortic valve regurgitation is not visualized. No aortic stenosis is present.  7. The inferior vena cava is dilated in size with <50% respiratory variability, suggesting right atrial pressure of 15 mmHg. Comparison(s): Prior images reviewed side by side. Changes from prior study are noted. RV enlarged, RVSP higher compared to prior--now severely dilated RV with RVSP 95 mmHg. Findings communicated to Dr. Jegede. FINDINGS  Left Ventricle: Left ventricular ejection fraction, by estimation, is 65 to 70%. The left ventricle has normal function. The left ventricle has no regional wall motion abnormalities. The left ventricular internal cavity size was normal in size. There is  mild concentric left ventricular hypertrophy. The interventricular septum is flattened in systole and diastole, consistent with right ventricular pressure and volume overload. Left ventricular diastolic parameters were normal. Right Ventricle: The right ventricular size is severely enlarged. No increase in right ventricular wall thickness. Right ventricular systolic function is mildly reduced. There is severely elevated pulmonary artery systolic pressure. The tricuspid regurgitant velocity is 4.47 m/s, and with an assumed right atrial pressure of 15 mmHg, the estimated right ventricular systolic pressure is 94.9 mmHg. Left Atrium: Left atrial size was normal in size. Right Atrium: Right atrial size was severely dilated. Pericardium: Trivial pericardial effusion is present. Mitral Valve: Mitral valve bowing without frank prolapse. The mitral valve is abnormal. Moderate mitral valve regurgitation. No evidence of mitral valve stenosis. Tricuspid Valve: The tricuspid valve is grossly normal. Tricuspid valve regurgitation is moderate to severe. No evidence of tricuspid stenosis. Aortic Valve: The aortic valve is  tricuspid. Aortic valve regurgitation is not visualized. No aortic stenosis is present. Pulmonic Valve: The pulmonic valve was normal in structure. Pulmonic valve regurgitation is trivial. No evidence of pulmonic stenosis. Aorta: The aortic root, ascending aorta, aortic arch and descending aorta are all structurally normal, with no evidence of dilitation or obstruction. Venous: The inferior vena cava is dilated in size with less than 50% respiratory variability, suggesting right atrial pressure of 15 mmHg. IAS/Shunts: There is left bowing of the interatrial septum, suggestive of elevated right atrial pressure. No atrial level shunt detected by color flow Doppler.  LEFT VENTRICLE PLAX 2D LVIDd:         4.30 cm     Diastology LVIDs:         2.60 cm     LV e' medial:    7.18 cm/s LV PW:         1.10 cm     LV E/e' medial:  9.2 LV IVS:        1.20 cm     LV e' lateral:   14.50 cm/s LVOT diam:     2.40 cm     LV E/e' lateral: 4.5 LV SV:         75 LV SV Index:   40 LVOT Area:     4.52 cm LV IVRT:       81 msec  LV Volumes (MOD) LV vol d, MOD A2C: 99.2 ml LV vol d, MOD A4C: 39.1 ml LV vol s, MOD A2C: 26.7 ml LV vol s, MOD A4C: 10.3 ml LV SV MOD A2C:     72.5 ml LV SV MOD A4C:     39.1 ml LV SV MOD BP:  48.7 ml RIGHT VENTRICLE             IVC RV S prime:     14.30 cm/s  IVC diam: 3.00 cm TAPSE (M-mode): 1.3 cm LEFT ATRIUM           Index        RIGHT ATRIUM           Index LA diam:      3.50 cm 1.86 cm/m   RA Area:     25.90 cm LA Vol (A2C): 37.6 ml 19.97 ml/m  RA Volume:   95.30 ml  50.61 ml/m LA Vol (A4C): 33.9 ml 18.00 ml/m  AORTIC VALVE             PULMONIC VALVE LVOT Vmax:   107.00 cm/s PR End Diast Vel: 2.81 msec LVOT Vmean:  67.900 cm/s LVOT VTI:    0.166 m  AORTA Ao Root diam: 3.10 cm Ao Asc diam:  3.30 cm MITRAL VALVE                  TRICUSPID VALVE MV Area (PHT): 3.99 cm       TR Peak grad:   79.9 mmHg MV Decel Time: 190 msec       TR Vmax:        447.00 cm/s MR Peak grad:    83.5 mmHg MR Mean grad:     46.0 mmHg    SHUNTS MR Vmax:         457.00 cm/s  Systemic VTI:  0.17 m MR Vmean:        317.0 cm/s   Systemic Diam: 2.40 cm MR PISA:         1.01 cm MR PISA Eff ROA: 8 mm MR PISA Radius:  0.40 cm MV E velocity: 65.75 cm/s MV A velocity: 54.80 cm/s MV E/A ratio:  1.20 Martin Bruckner MD Electronically signed by Martin Bruckner MD Signature Date/Time: 02/09/2024/3:51:44 PM    Final    DG CHEST PORT 1 VIEW Result Date: 02/09/2024 CLINICAL DATA:  Increasing shortness of breath EXAM: PORTABLE CHEST 1 VIEW COMPARISON:  Film from the previous day. FINDINGS: Cardiac shadow is enlarged but stable accentuated by the portable technique. Right chest wall port is again seen and stable. Increasing central vascular congestion is noted. Increased airspace opacity on the right is noted likely representing edema. No acute bony abnormality is seen. IMPRESSION: Increasing vascular congestion and likely edema particularly on the right. Electronically Signed   By: Oneil Devonshire M.D.   On: 02/09/2024 02:18   CT Angio Chest PE W and/or Wo Contrast Result Date: 02/08/2024 EXAM: CTA CHEST, ABDOMEN AND PELVIS WITHOUT AND WITH CONTRAST 02/08/2024 03:23:01 AM TECHNIQUE: CTA of the chest was performed without and with the administration of intravenous contrast. CTA of the abdomen and pelvis was performed without and with the administration of intravenous contrast. Multiplanar reformatted images are provided for review. MIP images are provided for review. Automated exposure control, iterative reconstruction, and/or weight based adjustment of the mA/kV was utilized to reduce the radiation dose to as low as reasonably achievable. COMPARISON: None available. CLINICAL HISTORY: Pulmonary embolism (PE) suspected, high prob. Placed patient on 4L [place]. Patient is normally on this amount when he is in crisis. O2 is staying around 90-92 which patient states he stays in the low 90s normally. Notes from chart; Hx of HIV and Sickle cell  crisis, GFR>60, c/o chest and abdominal pain; 100 ml omni 350. FINDINGS: VASCULATURE: AORTA:  No acute finding. No abdominal aortic aneurysm. No dissection. PULMONARY ARTERIES: The main pulmonary artery is enlarged in caliber measuring up to 4 cm. Interval increase in size of a right lower lobe segmental and subsegmental pulmonary embolus (11:227). Question interval development of a left upper lobe subsegmental pulmonary embolus (11:100). GREAT VESSELS OF AORTIC ARCH: No acute finding. No dissection. No arterial occlusion or significant stenosis. CELIAC TRUNK: No acute finding. No occlusion or significant stenosis. SUPERIOR MESENTERIC ARTERY: No acute finding. No occlusion or significant stenosis. INFERIOR MESENTERIC ARTERY: No acute finding. No occlusion or significant stenosis. RENAL ARTERIES: No acute finding. No occlusion or significant stenosis. ILIAC ARTERIES: No acute finding. No occlusion or significant stenosis. CHEST: MEDIASTINUM: Stable Enlarged right hilar lymph nodes measuring up to 2.3 cm. Stable Enlarged left hilar lymph nodes measuring up to 1 cm. Stable Enlarged prevascular lymph node measuring up to 1.2 cm. Stable enlarged right heart Otherwise heart and pericardium demonstrate no acute abnormality. LUNGS AND PLEURA: Chronic architectural distortion. Mild paraseptal emphysematous changes. Persistent interlobular septal wall thickening with mosaic attenuation of the lungs. Interval increase of patchy ground glass airspace opacities that are most prominent along the right middle lobe. Interlobular septal wall thickening. No pulmonary infarction. No evidence of pleural effusion or pneumothorax. THORACIC BONES AND SOFT TISSUES: Slight heterogeneity of the axial and appendicular skeleton likely related to sickle cell disease. Prominent but not enlarged bilateral axillary lymph nodes. ABDOMEN AND PELVIS: LIVER: Portal edema. Multiple vague pericentimeter hepatic lesions more prominent along the left  hepatic lobe (2:27). Enlarged liver measuring up to 22 cm. GALLBLADDER AND BILE DUCTS: Status post cholecystectomy. No biliary ductal dilatation. 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 PANCREAS: The pancreas is unremarkable. ADRENAL GLANDS: Bilateral adrenal glands demonstrate no acute abnormality. KIDNEYS, URETERS AND BLADDER: No stones in the kidneys or ureters. No hydronephrosis. No perinephric or periureteral stranding. Urinary bladder is unremarkable. GI AND BOWEL: Stomach and duodenal sweep demonstrate no acute abnormality. There is no bowel obstruction. No abnormal bowel wall thickening or distension. The appendix is not definitely identified, with no inflammatory changes in the right lower quadrant to suggest acute appendicitis. REPRODUCTIVE: Reproductive organs are unremarkable. PERITONEUM AND RETROPERITONEUM: No ascites or free air. LYMPH NODES: No lymphadenopathy. ABDOMINAL BONES AND SOFT TISSUES: Slight heterogeneity of the axial and appendicular skeleton likely related to sickle cell disease. No acute soft tissue abnormality. IMPRESSION: 1. Interval increase in size of a right lower lobe segmental and subsegmental pulmonary embolus, with possible new left upper lobe subsegmental pulmonary embolus. No pulmonary infarction. Chronic right heart failure with no findings to suggest worsened acute right heart strain. 2. Persistent mosaic attenuation and interlobular septal thickening of the lungs with superimposed interval increase of patchy ground glass airspace opacities that are most prominent along the right middle lobe has been questioned for infection /inflammation . 3. Enlarged main pulmonary artery measuring up to 4 cm. Finding consistent with pulmonary hypertension. 4. Hepatomegaly with multiple vague pericentimeter hepatic lesions more prominent along the left hepatic lobe and portal edema. Question developing nutmeg liver morphology. 5.  Stable and large bilateral hilar and mediastinal lymph nodes, as well as prominence but not enlarged bilateral axillary lymph nodes. 6. Other, non-acute and/or normal findings as above. Electronically signed by: Morgane Naveau MD 02/08/2024 03:45 AM EDT RP Workstation: [id number]   CT ABDOMEN PELVIS W CONTRAST Result Date: 02/08/2024 EXAM: CTA CHEST, ABDOMEN AND PELVIS WITHOUT AND WITH CONTRAST 02/08/2024 03:23:01 AM TECHNIQUE: CTA of  the chest was performed without and with the administration of intravenous contrast. CTA of the abdomen and pelvis was performed without and with the administration of intravenous contrast. Multiplanar reformatted images are provided for review. MIP images are provided for review. Automated exposure control, iterative reconstruction, and/or weight based adjustment of the mA/kV was utilized to reduce the radiation dose to as low as reasonably achievable. COMPARISON: None available. CLINICAL HISTORY: Pulmonary embolism (PE) suspected, high prob. Placed patient on 4L [place]. Patient is normally on this amount when he is in crisis. O2 is staying around 90-92 which patient states he stays in the low 90s normally. 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 FINDINGS: VASCULATURE: AORTA: No acute finding. No abdominal aortic aneurysm. No dissection. PULMONARY ARTERIES: The main pulmonary artery is enlarged in caliber measuring up to 4 cm. Interval increase in size of a right lower lobe segmental and subsegmental pulmonary embolus (11:227). Question interval development of a left upper lobe subsegmental pulmonary embolus (11:100). GREAT VESSELS OF AORTIC ARCH: No acute finding. No dissection. No arterial occlusion or significant stenosis. CELIAC TRUNK: No acute finding. No occlusion or significant stenosis. SUPERIOR MESENTERIC ARTERY: No acute finding. No occlusion or significant stenosis. INFERIOR MESENTERIC ARTERY: No acute  finding. No occlusion or significant stenosis. RENAL ARTERIES: No acute finding. No occlusion or significant stenosis. ILIAC ARTERIES: No acute finding. No occlusion or significant stenosis. CHEST: MEDIASTINUM: Stable Enlarged right hilar lymph nodes measuring up to 2.3 cm. Stable Enlarged left hilar lymph nodes measuring up to 1 cm. Stable Enlarged prevascular lymph node measuring up to 1.2 cm. Stable enlarged right heart Otherwise heart and pericardium demonstrate no acute abnormality. LUNGS AND PLEURA: Chronic architectural distortion. Mild paraseptal emphysematous changes. Persistent interlobular septal wall thickening with mosaic attenuation of the lungs. Interval increase of patchy ground glass airspace opacities that are most prominent along the right middle lobe. Interlobular septal wall thickening. No pulmonary infarction. No evidence of pleural effusion or pneumothorax. THORACIC BONES AND SOFT TISSUES: Slight heterogeneity of the axial and appendicular skeleton likely related to sickle cell disease. Prominent but not enlarged bilateral axillary lymph nodes. ABDOMEN AND PELVIS: LIVER: Portal edema. Multiple vague pericentimeter hepatic lesions more prominent along the left hepatic lobe (2:27). Enlarged liver measuring up to 22 cm. GALLBLADDER AND BILE DUCTS: Status post cholecystectomy. No biliary ductal dilatation. 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 PANCREAS: The pancreas is unremarkable. ADRENAL GLANDS: Bilateral adrenal glands demonstrate no acute abnormality. KIDNEYS, URETERS AND BLADDER: No stones in the kidneys or ureters. No hydronephrosis. No perinephric or periureteral stranding. Urinary bladder is unremarkable. GI AND BOWEL: Stomach and duodenal sweep demonstrate no acute abnormality. There is no bowel obstruction. No abnormal bowel wall thickening or distension. The appendix is not definitely identified, with no inflammatory changes in  the right lower quadrant to suggest acute appendicitis. REPRODUCTIVE: Reproductive organs are unremarkable. PERITONEUM AND RETROPERITONEUM: No ascites or free air. LYMPH NODES: No lymphadenopathy. ABDOMINAL BONES AND SOFT TISSUES: Slight heterogeneity of the axial and appendicular skeleton likely related to sickle cell disease. No acute soft tissue abnormality. IMPRESSION: 1. Interval increase in size of a right lower lobe segmental and subsegmental pulmonary embolus, with possible new left upper lobe subsegmental pulmonary embolus. No pulmonary infarction. Chronic right heart failure with no findings to suggest worsened acute right heart strain. 2. Persistent mosaic attenuation and interlobular septal thickening of the lungs with  superimposed interval increase of patchy ground glass airspace opacities that are most prominent along the right middle lobe has been questioned for infection /inflammation . 3. Enlarged main pulmonary artery measuring up to 4 cm. Finding consistent with pulmonary hypertension. 4. Hepatomegaly with multiple vague pericentimeter hepatic lesions more prominent along the left hepatic lobe and portal edema. Question developing nutmeg liver morphology. 5. Stable and large bilateral hilar and mediastinal lymph nodes, as well as prominence but not enlarged bilateral axillary lymph nodes. 6. Other, non-acute and/or normal findings as above. Electronically signed by: Morgane Naveau MD 02/08/2024 03:45 AM EDT RP Workstation: [id number]   DG Chest Portable 1 View Result Date: 02/08/2024 EXAM: 1 VIEW(S) XRAY OF THE CHEST 02/08/2024 12:28:00 AM COMPARISON: 01/16/2024 CLINICAL HISTORY: sob beyond baseline, chest pain. Pt having sickle cell criss, SOB and chest pain. FINDINGS: LINES, TUBES AND DEVICES: Similar positioning of the right chest port-a-cath with loop catheter tubing at the thoracic inlet. The tip is in the low SVC. LUNGS AND PLEURA: Prominent central pulmonary arteries. Similar bilateral  interstitial coarsening. Question vague ground-glass opacities in the mid lungs. Right apical pleural parenchymal scarring. No pulmonary edema. No pleural effusion. No pneumothorax. HEART AND MEDIASTINUM: Stable cardiomediastinal silhouette. BONES AND SOFT TISSUES: No acute osseous abnormality. IMPRESSION: 1. Similar interstitial coarsening with possible vague mid-lung ground-glass opacities. This may be projectional artifact, or due to edema or infection. Electronically signed by: Norman Gatlin MD 02/08/2024 12:36 AM EDT RP Workstation: [id number]    Microbiology: No results found for this or any previous visit (from the past 240 hours).   Labs: Basic Metabolic Panel: Recent Labs  Lab 02/09/24 1300 02/09/24 2113 02/11/24 0404 02/13/24 1207  NA 135 135 135 138  K 5.8* 4.6 4.8 4.7  CL 103 103 102 105  CO2 20* 22 24 25   GLUCOSE 107* 107* 103* 116*  BUN 28* 27* 26* 17  CREATININE 2.19* 1.74* 1.75* 1.10  CALCIUM  8.1* 8.2* 8.5* 8.4*   Liver Function Tests: Recent Labs  Lab 02/09/24 1300 02/13/24 1207  AST 98* 80*  ALT 55* 87*  ALKPHOS 62 70  BILITOT 12.6* 5.6*  PROT 7.6 7.8  ALBUMIN 4.0 3.8   No results for input(s): LIPASE, AMYLASE in the last 168 hours. No results for input(s): AMMONIA in the last 168 hours. CBC: Recent Labs  Lab 02/10/24 0513 02/11/24 0404 02/12/24 0555 02/14/24 0400  WBC 11.6* 11.8* 13.0* 10.1  NEUTROABS  --   --   --  5.1  HGB 7.2* 6.9* 7.8* 7.5*  HCT 18.7* 18.9* 21.5* 20.3*  MCV 100.0 96.4 95.6 95.8  PLT 204 209 219 211   Cardiac Enzymes: No results for input(s): CKTOTAL, CKMB, CKMBINDEX, TROPONINI in the last 168 hours. BNP: Invalid input(s): POCBNP CBG: No results for input(s): GLUCAP in the last 168 hours.  Time coordinating discharge: 50 minutes  Signed:  [name] Cover NP   [facility] 02/16/2024, 11:13 AM

## 2024-02-16 NOTE — Plan of Care (Signed)
  Problem: Bowel/Gastric: Goal: Gut motility will be maintained Outcome: Not Progressing  Dulcolax suppository given for constipation

## 2024-02-17 ENCOUNTER — Other Ambulatory Visit (HOSPITAL_COMMUNITY): Payer: Self-pay

## 2024-02-17 ENCOUNTER — Other Ambulatory Visit: Payer: Self-pay | Admitting: Nurse Practitioner

## 2024-02-17 DIAGNOSIS — F1190 Opioid use, unspecified, uncomplicated: Secondary | ICD-10-CM

## 2024-02-17 DIAGNOSIS — D571 Sickle-cell disease without crisis: Secondary | ICD-10-CM

## 2024-02-17 LAB — CBC
HCT: 18.2 % — ABNORMAL LOW (ref 39.0–52.0)
Hemoglobin: 6.6 g/dL — CL (ref 13.0–17.0)
MCH: 33.9 pg (ref 26.0–34.0)
MCHC: 35.7 g/dL (ref 30.0–36.0)
MCV: 94.8 fL (ref 80.0–100.0)
Platelets: 198 K/uL (ref 150–400)
RBC: 1.92 MIL/uL — ABNORMAL LOW (ref 4.22–5.81)
RDW: 24.5 % — ABNORMAL HIGH (ref 11.5–15.5)
WBC: 7.8 K/uL (ref 4.0–10.5)
nRBC: 72.9 % — ABNORMAL HIGH (ref 0.0–0.2)

## 2024-02-17 LAB — PREPARE RBC (CROSSMATCH)

## 2024-02-17 MED ORDER — HEPARIN SOD (PORK) LOCK FLUSH 100 UNIT/ML IV SOLN
500.0000 [IU] | INTRAVENOUS | Status: AC | PRN
  Administered 2024-02-17: 500 [IU]
  Filled 2024-02-17: qty 5

## 2024-02-17 MED ORDER — DIPHENHYDRAMINE HCL 50 MG/ML IJ SOLN
25.0000 mg | Freq: Once | INTRAMUSCULAR | Status: AC
  Administered 2024-02-17: 25 mg via INTRAVENOUS
  Filled 2024-02-17: qty 1

## 2024-02-17 MED ORDER — SODIUM CHLORIDE 0.9% IV SOLUTION: Freq: Once | INTRAVENOUS | Status: AC

## 2024-02-17 MED ORDER — ACETAMINOPHEN 325 MG PO TABS
650.0000 mg | ORAL_TABLET | Freq: Once | ORAL | Status: AC
  Administered 2024-02-17: 650 mg via ORAL
  Filled 2024-02-17: qty 2

## 2024-02-17 NOTE — Progress Notes (Signed)
 Patient accompanied to ED entrance to drive himself home. Belongings and medications with patient at discharge.

## 2024-02-17 NOTE — Telephone Encounter (Unsigned)
 Copied from CRM [id number]. Topic: Clinical - Medication Refill >> Feb 17, 2024  4:42 PM Nathanel BROCKS wrote: Medication: oxyCODONE -acetaminophen  (PERCOCET) 10-325 MG tablet  Has the patient contacted their pharmacy? Yes   This is the patient's preferred pharmacy:    [facility] - [facility] [address] [place] [address] Phone: [phone] Fax: [phone]  Is this the correct pharmacy for this prescription? Yes If no, delete pharmacy and type the correct one.   Has the prescription been filled recently? Yes  Is the patient out of the medication? Yes  Has the patient been seen for an appointment in the last year OR does the patient have an upcoming appointment? Yes  Can we respond through MyChart? Yes  Agent: Please be advised that Rx refills may take up to 3 business days. We ask that you follow-up with your pharmacy.

## 2024-02-17 NOTE — Progress Notes (Signed)
 Discharge medication delivered to patient at the bedside in a secure bag

## 2024-02-18 ENCOUNTER — Other Ambulatory Visit: Payer: Self-pay | Admitting: Nurse Practitioner

## 2024-02-18 ENCOUNTER — Other Ambulatory Visit (HOSPITAL_COMMUNITY): Payer: Self-pay

## 2024-02-18 ENCOUNTER — Telehealth: Payer: Self-pay | Admitting: *Deleted

## 2024-02-18 ENCOUNTER — Telehealth: Payer: Self-pay

## 2024-02-18 DIAGNOSIS — D571 Sickle-cell disease without crisis: Secondary | ICD-10-CM

## 2024-02-18 DIAGNOSIS — F1190 Opioid use, unspecified, uncomplicated: Secondary | ICD-10-CM

## 2024-02-18 DIAGNOSIS — I2699 Other pulmonary embolism without acute cor pulmonale: Secondary | ICD-10-CM

## 2024-02-18 LAB — TYPE AND SCREEN
ABO/RH(D): O POS
Antibody Screen: NEGATIVE
Donor AG Type: NEGATIVE
Unit division: 0

## 2024-02-18 LAB — BPAM RBC
Blood Product Expiration Date: 202511222359
ISSUE DATE / TIME: 202510301108
Unit Type and Rh: 5100

## 2024-02-18 MED ORDER — OXYCODONE-ACETAMINOPHEN 10-325 MG PO TABS: 1.0000 | ORAL_TABLET | ORAL | 0 refills | Status: DC | PRN

## 2024-02-18 NOTE — Telephone Encounter (Signed)
 I can not decline . Please decline later. KH

## 2024-02-18 NOTE — Progress Notes (Signed)
 Reviewed PDMP substance reporting system prior to prescribing opiate medications. No inconsistencies noted.   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  Dispense: 90 tablet; Refill: 0  2. Chronic, continuous use of opioids  - oxyCODONE -acetaminophen  (PERCOCET) 10-325 MG tablet; Take 1 tablet by mouth every 4 (four) hours as needed for pain.  Dispense: 90 tablet; Refill: 0

## 2024-02-18 NOTE — Transitions of Care (Post Inpatient/ED Visit) (Signed)
 02/18/2024  Name: Martin [name] MRN: [id number] DOB: 1991/02/28  Today's TOC FU Call Status: Today's TOC FU Call Status:: Successful TOC FU Call Completed TOC FU Call Complete Date: 02/18/24 Patient's Name and Date of Birth confirmed.  Transition Care Management Follow-up Telephone Call Date of Discharge: 02/17/24 Discharge Facility: [facility]) Type of Discharge: Inpatient Admission  Items Reviewed: Did you receive and understand the discharge instructions provided?: Yes Medications obtained,verified, and reconciled?: Yes (Medications Reviewed) Any new allergies since your discharge?: No Dietary orders reviewed?: Yes Type of Diet Ordered:: heart healthy Do you have support at home?: Yes People in Home [RPT]:  (pt does not share name of who he is living with) Name of Support/Comfort Primary Source: pt does not state who he is living with, pt needs affordable options for housing Reviewed signs /symptoms pulmonary embolism, reportable signs / symptoms Reviewed signs/ symptoms infection In basket message sent to primary care provider reporting pt has enough pain medication for today and would like refill sent to [facility], pt states he has already called about this. Referral placed for RN CM longitudinal case manager Pt placed on BSW schedule for 02/21/24 @ 10 am for SDOH needs/ housing instability and affordability  Medications Reviewed Today: Medications Reviewed Today     Reviewed by Aura Mliss LABOR, RN (Registered Nurse) on 02/18/24 at 1336  Med List Status: <None>   Medication Order Taking? Sig Documenting Provider Last Dose Status Informant  abacavir -dolutegravir -lamiVUDine  (TRIUMEQ ) 600-50-300 MG tablet [id number] Yes Take 1 tablet by mouth daily. Pearlean Manus, MD  Active Self, Pharmacy Records           Med Note RENNIS, DUROJAHYE' R   Tue Feb 08, 2024  4:54 AM) Patients disp hx does not support claims   albuterol  (VENTOLIN  HFA) 108 (90 Base)  MCG/ACT inhaler [id number] Yes Inhale 2 puffs into the lungs every 6 (six) hours as needed for wheezing or shortness of breath. Pearlean Manus, MD  Active Self, Pharmacy Records           Med Note RENNIS, DUROJAHYE' R   Tue Feb 08, 2024  4:54 AM) Patients disp hx does not support claims   Cholecalciferol  (VITAMIN D3) 125 MCG (5000 UT) CAPS [id number] Yes Take 5,000 Units by mouth every Tuesday. [provider]  Active Self, Pharmacy Records           Med Note RENNIS, DUROJAHYE' R   Tue Feb 08, 2024  4:54 AM) Patients disp hx does not support claims   cyclobenzaprine  (FLEXERIL ) 5 MG tablet [id number] Yes Take 1 tablet (5 mg total) by mouth 2 (two) times daily as needed for muscle spasms. [name], [name], [name]  Active Self, Pharmacy Records           Med Note RENNIS, DUROJAHYE' R   Tue Feb 08, 2024  4:53 AM) Patients disp hx does not support claims   dabigatran (PRADAXA) 150 MG CAPS capsule [id number] Yes Take 1 capsule (150 mg total) by mouth 2 (two) times daily. Jegede, Olugbemiga E, MD  Active   dabigatran (PRADAXA) 150 MG CAPS capsule [id number]  Take 1 capsule (150 mg total) by mouth every 12 (twelve) hours.  Patient not taking: Reported on 02/18/2024   Cherylene [name] HERO, NP  Active   Deferasirox  (JADENU ) 360 MG TABS [id number]  Take 2 tablets (720 mg total) by mouth daily.  Patient not taking: Reported on 02/18/2024   [name], [name], [name]  Active Self, Pharmacy Records  diphenhydrAMINE  (BENADRYL ) 25 MG tablet [id number] Yes Take 1 tablet (25 mg total) by mouth every 4 (four) hours as needed for itching. Pearlean Manus, MD  Active Self, Pharmacy Records  folic acid  (FOLVITE ) 400 MCG tablet [id number] Yes Take 1 tablet (400 mcg total) by mouth daily. Pearlean Manus, MD  Active Self, Pharmacy Records           Med Note RENNIS, DUROJAHYE' R   Tue Feb 08, 2024  4:53 AM) Patients disp hx does not support claims   gabapentin  (NEURONTIN ) 300 MG capsule [id number] Yes Take  1 capsule (300 mg total) by mouth at bedtime as needed (nerve pain). Pearlean Manus, MD  Active Self, Pharmacy Records           Med Note RENNIS, DUROJAHYE' R   Tue Feb 08, 2024  4:53 AM) Patients disp hx does not support claims  guaiFENesin  (MUCINEX ) 600 MG 12 hr tablet [id number]  Take 1 tablet (600 mg total) by mouth 2 (two) times daily.  Patient not taking: Reported on 02/18/2024   Pearlean Manus, MD  Active Self, Pharmacy Records  hydroxyurea  (HYDREA ) 500 MG capsule [id number] Yes Take 4 capsules (2,000 mg total) by mouth at bedtime. Pearlean Manus, MD  Active Self, Pharmacy Records           Med Note RENNIS, DUROJAHYE' R   Tue Feb 08, 2024  4:53 AM) Patients disp hx does not support claims   NARCAN  4 MG/0.1ML LIQD nasal spray kit [id number]  Place 1 spray into the nose as needed (respiratory distress / opioid overdose). [provider]  Active Self, Pharmacy Records           Med Note [facility], NATHANEL SAILOR   Tue Aug 10, 2023  2:21 PM)    oxyCODONE -acetaminophen  (PERCOCET) 10-325 MG tablet [id number] Yes Take 1 tablet by mouth every 4 (four) hours as needed for pain. [name], [name], [name]  Active Self, Pharmacy Records  pantoprazole  (PROTONIX ) 40 MG tablet [id number]  Take 1 tablet (40 mg total) by mouth daily as needed (acid reflux).  Patient not taking: Reported on 02/18/2024   Pearlean Manus, MD  Active Self, Pharmacy Records  senna-docusate (SENOKOT-S) 8.6-50 MG tablet [id number] Yes Take 2 tablets by mouth at bedtime. Pearlean Manus, MD  Active Self, Pharmacy Records  sulfamethoxazole-trimethoprim (BACTRIM DS) 800-160 MG tablet [id number]  Take 1 tablet by mouth 2 (two) times daily.  Patient not taking: Reported on 02/18/2024   [name], [name], [name]  Active Self, Pharmacy Records           Med Note RENNIS, DUROJAHYE' R   Tue Feb 08, 2024  4:57 AM) Patient is not sure about this medication  triamcinolone  cream (KENALOG ) 0.1 % [id number]  Apply 1 Application  topically 2 (two) times daily.  Patient not taking: Reported on 02/18/2024   Tilford Bertram HERO, [name]  Active Self, Pharmacy Records           Med Note [facility], NATHANEL SAILOR   Tue Aug 10, 2023  2:21 PM)              Home Care and Equipment/Supplies: Were Home Health Services Ordered?: No Any new equipment or medical supplies ordered?: No  Functional Questionnaire: Do you need assistance with bathing/showering or dressing?: No Do you need assistance with meal preparation?: No Do you need assistance with eating?: No Do you have difficulty maintaining continence: No Do you need assistance with getting out of bed/getting out of a chair/moving?:  No Do you have difficulty managing or taking your medications?: No  Follow up appointments reviewed: PCP Follow-up appointment confirmed?: No (pt declined for RN CM to schedule appointment, states he will schedule) MD Provider Line [phone] Given: No Specialist Hospital Follow-up appointment confirmed?: Yes Date of Specialist follow-up appointment?: 03/01/24 Follow-Up Specialty Provider:: HF clinic Do you need transportation to your follow-up appointment?: No Do you understand care options if your condition(s) worsen?: Yes-patient verbalized understanding  SDOH Interventions Today    Flowsheet Row Most Recent Value  SDOH Interventions   Food Insecurity Interventions Intervention Not Indicated  Housing Interventions AMB Referral  [placed pt on BSW schedule for affordable housing options]  Transportation Interventions Intervention Not Indicated  Utilities Interventions Intervention Not Indicated    [name] [facility], BSN RN Care Manager/ Transition of Care [place]/ [facility] Population Health [phone]

## 2024-02-18 NOTE — Transitions of Care (Post Inpatient/ED Visit) (Signed)
   02/18/2024  Name: CAYDYN SPRUNG MRN: [id number] DOB: September 18, 1990  Today's TOC FU Call Status: Today's TOC FU Call Status:: Unsuccessful Call (1st Attempt) Unsuccessful Call (1st Attempt) Date: 02/18/24  Attempted to reach the patient regarding the most recent Inpatient/ED visit.  Follow Up Plan: Additional outreach attempts will be made to reach the patient to complete the Transitions of Care (Post Inpatient/ED visit) call.   Arvin Seip RN, BSN, CCM [employer], Population Health Case Manager Phone: [phone]

## 2024-02-18 NOTE — Telephone Encounter (Signed)
 He was in the hospital for 9 days so he should still have some pain medication to take, medication not refilled today     12:29 PM FP should have enough to last him till Thursday next week  12:33 PM FP called pt refill was  due Tuesday per pt . Pt increased dosing try to avoid a crisis. Please advise   Per provider she will send in howevere he should not be takeing more than prescribe with out being seen . Pt was ok and stated he will dicuss increase at next visit. KH

## 2024-02-21 ENCOUNTER — Other Ambulatory Visit: Payer: Self-pay

## 2024-02-21 NOTE — Patient Outreach (Signed)
 Social Drivers of Health  Community Resource and Care Coordination Visit Note   02/21/2024  Name: Martin [name] MRN: [id number] DOB:[birth year]  Situation: Referral received for [facility] needs assessment and assistance related to [employer] . I obtained verbal consent from Patient.  Visit completed with Patient on the phone.   Background:   SDOH Interventions Today    Flowsheet Row Most Recent Value  SDOH Interventions   Food Insecurity Interventions Community Resources Provided     Assessment:   Goals Addressed             This Visit's Progress    BSW Goal       Current SDOH Barriers:  Limited access to food Housing barriers  Interventions: Referred patient to community resources  Advised patient to contact [facility] billing services Advised patient to search for housing on [place] housing search.          Recommendation:   attend all scheduled provider appointments follow up with [place] housing search regarding housing needs  Follow Up Plan:   Patient has achieved all patient stated goals. [employer] will be closed. Patient has been provided contact information should new needs arise.   Orlean Fey, BSW [place]  Value Based Care Institute Social Worker, [employer] [facility] [phone]

## 2024-02-21 NOTE — Patient Instructions (Signed)
 Visit Information  Thank you for taking time to visit with me today. Please don't hesitate to contact me if I can be of assistance to you before our next scheduled appointment.  Our next appointment is no further scheduled appointments.   Please call the care guide team at [phone] if you need to cancel or reschedule your appointment.   Following is a copy of your care plan:   Goals Addressed             This Visit's Progress    BSW Goal       Current SDOH Barriers:  Limited access to food Housing barriers  Interventions: Referred patient to community resources  Advised patient to contact [facility] billing services Advised patient to search for housing on [place] housing search.          Please call the Suicide and Crisis Lifeline: 988 call the [place]  National Suicide Prevention Lifeline: 667 274 4727 or TTY: (312)325-7787 TTY 7161828906) to talk to a trained counselor call 1-800-273-TALK (toll free, 24 hour hotline) go to [facility] [address] [place] [phone]) call 911 if you are experiencing a Mental Health or Behavioral Health Crisis or need someone to talk to.  Patient verbalized understanding of Care plan and visit instructions communicated this visit  Orlean Fey, BSW [facility]  Value Based Care Institute Social Worker, [employer] [facility] [phone]

## 2024-02-24 ENCOUNTER — Telehealth: Payer: Self-pay

## 2024-02-24 NOTE — Progress Notes (Signed)
 Complex Care Management Note  Care Guide Note 02/24/2024 Name: HEROLD SALGUERO MRN: [id number] DOB: [birth year]  Martin [name] is a 33 y.o. year old male who sees [name], [name], [name] for primary care. I reached out to [employer] by phone today to offer complex care management services.  Mr. Venuto was given information about Complex Care Management services today including:   The Complex Care Management services include support from the care team which includes your Nurse Care Manager, Clinical Social Worker, or Pharmacist.  The Complex Care Management team is here to help remove barriers to the health concerns and goals most important to you. Complex Care Management services are voluntary, and the patient may decline or stop services at any time by request to their care team member.   Complex Care Management Consent Status: Patient agreed to services and verbal consent obtained.   Follow up plan:  Telephone appointment with complex care management team member scheduled for:  03/07/24 and 03/15/24  Encounter Outcome:  Patient Scheduled  Leotis Rase [facility], [facility] Guide  Direct Dial: [phone]  Fax [phone]

## 2024-02-25 ENCOUNTER — Encounter (HOSPITAL_COMMUNITY): Payer: Self-pay

## 2024-02-25 ENCOUNTER — Emergency Department (HOSPITAL_COMMUNITY)
Admission: EM | Admit: 2024-02-25 | Discharge: 2024-02-25 | Disposition: A | Discharge status: Home / Self Care | Attending: Emergency Medicine | Admitting: Emergency Medicine

## 2024-02-25 DIAGNOSIS — D5700 Hb-SS disease with crisis, unspecified: Secondary | ICD-10-CM | POA: Insufficient documentation

## 2024-02-25 LAB — CBC WITH DIFFERENTIAL/PLATELET
Abs Immature Granulocytes: 0.04 K/uL (ref 0.00–0.07)
Basophils Absolute: 0 K/uL (ref 0.0–0.1)
Basophils Relative: 0 %
Eosinophils Absolute: 0.1 K/uL (ref 0.0–0.5)
Eosinophils Relative: 1 %
HCT: 22.3 % — ABNORMAL LOW (ref 39.0–52.0)
Hemoglobin: 7.4 g/dL — ABNORMAL LOW (ref 13.0–17.0)
Immature Granulocytes: 0 %
Lymphocytes Relative: 37 %
Lymphs Abs: 4.4 K/uL — ABNORMAL HIGH (ref 0.7–4.0)
MCH: 34.1 pg — ABNORMAL HIGH (ref 26.0–34.0)
MCHC: 33.2 g/dL (ref 30.0–36.0)
MCV: 102.8 fL — ABNORMAL HIGH (ref 80.0–100.0)
Monocytes Absolute: 0.9 K/uL (ref 0.1–1.0)
Monocytes Relative: 7 %
Neutro Abs: 6.4 K/uL (ref 1.7–7.7)
Neutrophils Relative %: 55 %
Platelets: 243 K/uL (ref 150–400)
RBC: 2.17 MIL/uL — ABNORMAL LOW (ref 4.22–5.81)
RDW: 26.7 % — ABNORMAL HIGH (ref 11.5–15.5)
Smear Review: NORMAL
WBC: 11.8 K/uL — ABNORMAL HIGH (ref 4.0–10.5)
nRBC: 30 % — ABNORMAL HIGH (ref 0.0–0.2)

## 2024-02-25 LAB — COMPREHENSIVE METABOLIC PANEL WITH GFR
ALT: 23 U/L (ref 0–44)
AST: 35 U/L (ref 15–41)
Albumin: 3.6 g/dL (ref 3.5–5.0)
Alkaline Phosphatase: 65 U/L (ref 38–126)
Anion gap: 7 (ref 5–15)
BUN: 11 mg/dL (ref 6–20)
CO2: 23 mmol/L (ref 22–32)
Calcium: 8.1 mg/dL — ABNORMAL LOW (ref 8.9–10.3)
Chloride: 111 mmol/L (ref 98–111)
Creatinine, Ser: 0.81 mg/dL (ref 0.61–1.24)
GFR, Estimated: >60 mL/min (ref >60)
Glucose, Bld: 93 mg/dL (ref 70–99)
Potassium: 3.9 mmol/L (ref 3.5–5.1)
Sodium: 140 mmol/L (ref 135–145)
Total Bilirubin: 3.9 mg/dL — ABNORMAL HIGH (ref 0.0–1.2)
Total Protein: 7.5 g/dL (ref 6.5–8.1)

## 2024-02-25 LAB — RETICULOCYTES
Immature Retic Fract: 31.3 % — ABNORMAL HIGH (ref 2.3–15.9)
RBC.: 2.24 MIL/uL — ABNORMAL LOW (ref 4.22–5.81)
Retic Count, Absolute: 464.4 K/uL — ABNORMAL HIGH (ref 19.0–186.0)
Retic Ct Pct: 20.7 % — ABNORMAL HIGH (ref 0.4–3.1)

## 2024-02-25 MED ORDER — SODIUM CHLORIDE 0.9 % IV SOLN
12.5000 mg | Freq: Once | INTRAVENOUS | Status: AC
  Administered 2024-02-25: 12.5 mg via INTRAVENOUS
  Filled 2024-02-25: qty 12.5

## 2024-02-25 MED ORDER — SODIUM CHLORIDE 0.45 % IV SOLN: INTRAVENOUS | Status: DC

## 2024-02-25 MED ORDER — ONDANSETRON HCL 4 MG/2ML IJ SOLN
4.0000 mg | INTRAMUSCULAR | Status: DC | PRN
Start: 2024-02-25 — End: 2024-02-25
  Filled 2024-02-25: qty 2

## 2024-02-25 MED ORDER — HYDROMORPHONE HCL 1 MG/ML IJ SOLN
2.0000 mg | INTRAMUSCULAR | Status: AC
  Administered 2024-02-25: 2 mg via INTRAVENOUS
  Filled 2024-02-25: qty 2

## 2024-02-25 MED ORDER — OXYCODONE HCL 5 MG PO TABS
10.0000 mg | ORAL_TABLET | Freq: Once | ORAL | Status: AC
  Administered 2024-02-25: 10 mg via ORAL
  Filled 2024-02-25: qty 2

## 2024-02-25 NOTE — ED Triage Notes (Signed)
 Patient arrived with complaints of generalized pain r/t SSC. Last dose of home meds at 11:30 tonight with no relief. Oxygen  saturation reading 86% in triage, states he has oxygen  at home he uses when he is in a crisis.

## 2024-02-25 NOTE — ED Provider Notes (Signed)
 [place] EMERGENCY DEPARTMENT AT [facility] Provider Note   CSN: [id number] Arrival date & time: 02/25/24  0222     Patient presents with: Sickle Cell Pain Crisis   Martin [name] is a 33 y.o. male.   The history is provided by the patient.  Sickle Cell Pain Crisis Martin [name] is a 33 y.o. male who presents to the Emergency Department complaining of pain crisis.  He presents to the emergency department for evaluation of sickle cell pain crisis with pain all over, worse in his back and hips.  Symptoms started yesterday morning.  No associated fever, vomiting, diarrhea, chest pain, shortness of breath.  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  He is compliant with his home medications.  He describes his pain is typical for his pain crisis.  He usually takes Percocet 10, and has tried 6 doses over the last 24 hours without improvement in his symptoms.  No dysuria.  No numbness or weakness.   Prior to Admission medications   Medication Sig Start Date End Date Taking? Authorizing Provider  abacavir -dolutegravir -lamiVUDine  (TRIUMEQ ) 600-50-300 MG tablet Take 1 tablet by mouth daily. 08/13/23   Pearlean Manus, MD  albuterol  (VENTOLIN  HFA) 108 (90 Base) MCG/ACT inhaler Inhale 2 puffs into the lungs every 6 (six) hours as needed for wheezing or shortness of breath. 08/13/23   Pearlean Manus, MD  Cholecalciferol  (VITAMIN D3) 125 MCG (5000 UT) CAPS Take 5,000 Units by mouth every Tuesday.    [provider]  cyclobenzaprine  (FLEXERIL ) 5 MG tablet Take 1 tablet (5 mg total) by mouth 2 (two) times daily as needed for muscle spasms. 02/04/24   [name], [name], [name]  dabigatran (PRADAXA) 150 MG CAPS capsule Take 1 capsule (150 mg total) by mouth 2 (two) times daily. 02/09/24   Jegede, Olugbemiga E, MD  dabigatran (PRADAXA) 150 MG CAPS capsule Take 1 capsule (150 mg total) by mouth every 12 (twelve)  hours. Patient not taking: Reported on 02/18/2024 02/16/24   Cherylene [name] HERO, NP  Deferasirox  (JADENU ) 360 MG TABS Take 2 tablets (720 mg total) by mouth daily. Patient not taking: Reported on 02/18/2024 11/05/23   [name], [name], [name]  diphenhydrAMINE  (BENADRYL ) 25 MG tablet Take 1 tablet (25 mg total) by mouth every 4 (four) hours as needed for itching. 08/13/23   Pearlean Manus, MD  folic acid  (FOLVITE ) 400 MCG tablet Take 1 tablet (400 mcg total) by mouth daily. 08/13/23   Pearlean Manus, MD  gabapentin  (NEURONTIN ) 300 MG capsule Take 1 capsule (300 mg total) by mouth at bedtime as needed (nerve pain). 08/13/23   Pearlean Manus, MD  guaiFENesin  (MUCINEX ) 600 MG 12 hr tablet Take 1 tablet (600 mg total) by mouth 2 (two) times daily. Patient not taking: Reported on 02/18/2024 08/13/23 08/12/24  Pearlean Manus, MD  hydroxyurea  (HYDREA ) 500 MG capsule Take 4 capsules (2,000 mg total) by mouth at bedtime. 08/13/23   Pearlean Manus, MD  NARCAN  4 MG/0.1ML LIQD nasal spray kit Place 1 spray into the nose as needed (respiratory distress / opioid overdose). 01/07/23   [provider]  oxyCODONE -acetaminophen  (PERCOCET) 10-325 MG tablet Take 1 tablet by mouth every 4 (four) hours as needed for pain. 02/19/24   [name], [name], [name]  pantoprazole  (PROTONIX ) 40 MG tablet Take 1 tablet (40 mg total) by mouth daily as needed (acid reflux). Patient not taking: Reported on 02/18/2024 08/13/23  Pearlean Manus, MD  senna-docusate (SENOKOT-S) 8.6-50 MG tablet Take 2 tablets by mouth at bedtime. 08/13/23   Pearlean Manus, MD  sulfamethoxazole-trimethoprim (BACTRIM DS) 800-160 MG tablet Take 1 tablet by mouth 2 (two) times daily. Patient not taking: Reported on 02/18/2024 02/04/24   [name], [name], [name]  triamcinolone  cream (KENALOG ) 0.1 % Apply 1 Application topically 2 (two) times daily. Patient not taking: Reported on 02/18/2024 01/12/23   Tilford Bertram HERO, [name]    Allergies: Ketorolac   tromethamine , Tape, Wound dressing adhesive, and Bactoshield chg [chlorhexidine  gluconate]    Review of Systems  All other systems reviewed and are negative.   Updated Vital Signs BP 100/64   Pulse 85   Temp 98.2 F (36.8 C)   Resp 11   SpO2 92%   Physical Exam Vitals and nursing note reviewed.  Constitutional:      Appearance: He is well-developed.  HENT:     Head: Normocephalic and atraumatic.  Cardiovascular:     Rate and Rhythm: Normal rate and regular rhythm.     Heart sounds: No murmur heard. Pulmonary:     Effort: Pulmonary effort is normal. No respiratory distress.     Breath sounds: Normal breath sounds.  Abdominal:     Palpations: Abdomen is soft.     Tenderness: There is no abdominal tenderness. There is no guarding or rebound.  Musculoskeletal:        General: No swelling or tenderness.  Skin:    General: Skin is warm and dry.  Neurological:     Mental Status: He is alert and oriented to person, place, and time.  Psychiatric:        Behavior: Behavior normal.     (all labs ordered are listed, but only abnormal results are displayed) Labs Reviewed  CBC WITH DIFFERENTIAL/PLATELET - Abnormal; Notable for the following components:      Result Value   WBC 11.8 (*)    RBC 2.17 (*)    Hemoglobin 7.4 (*)    HCT 22.3 (*)    MCV 102.8 (*)    MCH 34.1 (*)    RDW 26.7 (*)    nRBC 30.0 (*)    Lymphs Abs 4.4 (*)    All other components within normal limits  RETICULOCYTES - Abnormal; Notable for the following components:   Retic Ct Pct 20.7 (*)    RBC. 2.24 (*)    Retic Count, Absolute 464.4 (*)    Immature Retic Fract 31.3 (*)    All other components within normal limits  COMPREHENSIVE METABOLIC PANEL WITH GFR - Abnormal; Notable for the following components:   Calcium  8.1 (*)    Total Bilirubin 3.9 (*)    All other components within normal limits  URINALYSIS, ROUTINE W REFLEX MICROSCOPIC    EKG: None  Radiology: No results found.   Procedures   CRITICAL CARE Performed by: Almarie Burner   Total critical care time: 35 minutes  Critical care time was exclusive of separately billable procedures and treating other patients.  Critical care was necessary to treat or prevent imminent or life-threatening deterioration.  Critical care was time spent personally by me on the following activities: development of treatment plan with patient and/or surrogate as well as nursing, discussions with consultants, evaluation of patient's response to treatment, examination of patient, obtaining history from patient or surrogate, ordering and performing treatments and interventions, ordering and review of laboratory studies, ordering and review of radiographic studies, pulse oximetry and re-evaluation of patient's condition.  Medications Ordered in the ED  0.45 % sodium chloride  infusion (0 mLs Intravenous Stopped 02/25/24 0609)  ondansetron  (ZOFRAN ) injection 4 mg (has no administration in time range)  HYDROmorphone  (DILAUDID ) injection 2 mg (2 mg Intravenous Given 02/25/24 0254)  HYDROmorphone  (DILAUDID ) injection 2 mg (2 mg Intravenous Given 02/25/24 0342)  HYDROmorphone  (DILAUDID ) injection 2 mg (2 mg Intravenous Given 02/25/24 0432)  diphenhydrAMINE  (BENADRYL ) 12.5 mg in sodium chloride  0.9 % 50 mL IVPB (0 mg Intravenous Stopped 02/25/24 0414)  oxyCODONE  (Oxy IR/ROXICODONE ) immediate release tablet 10 mg (10 mg Oral Given 02/25/24 0609)                                    Medical Decision Making Amount and/or Complexity of Data Reviewed Labs: ordered.  Risk Prescription drug management.   Patient with history of sickle cell anemia, pulmonary embolism on anticoagulation, oxygen  dependence here for evaluation of pain similar to prior pain crises.  Patient was hypoxic at time of ED arrival, not on his home oxygen .  He does not have any acute respiratory symptoms at this time.  CBC with stable anemia.  CMP, reticulocyte count are appropriate for him.   He was treated with pain medications in the emergency department with good control of his pain.  No evidence of acute infectious process.  Current picture is not consistent with recurrent PE.  Feel he is stable for discharge home with outpatient follow-up and return precautions.     Final diagnoses:  Sickle cell pain crisis [facility])    ED Discharge Orders     None          Griselda Norris, MD 02/25/24 [phone]

## 2024-02-29 ENCOUNTER — Emergency Department (HOSPITAL_COMMUNITY)

## 2024-02-29 ENCOUNTER — Encounter (HOSPITAL_COMMUNITY): Payer: Self-pay | Admitting: *Deleted

## 2024-02-29 ENCOUNTER — Emergency Department (HOSPITAL_COMMUNITY)
Admission: EM | Admit: 2024-02-29 | Discharge: 2024-02-29 | Disposition: A | Discharge status: Home / Self Care | Attending: Emergency Medicine | Admitting: Emergency Medicine

## 2024-02-29 ENCOUNTER — Non-Acute Institutional Stay (HOSPITAL_COMMUNITY)
Admission: AD | Admit: 2024-02-29 | Discharge: 2024-02-29 | Disposition: A | Discharge status: Home / Self Care | Source: Ambulatory Visit | Attending: Internal Medicine | Admitting: Internal Medicine

## 2024-02-29 ENCOUNTER — Telehealth (HOSPITAL_COMMUNITY): Payer: Self-pay | Admitting: *Deleted

## 2024-02-29 ENCOUNTER — Other Ambulatory Visit: Payer: Self-pay

## 2024-02-29 DIAGNOSIS — Z7901 Long term (current) use of anticoagulants: Secondary | ICD-10-CM | POA: Insufficient documentation

## 2024-02-29 DIAGNOSIS — J9611 Chronic respiratory failure with hypoxia: Secondary | ICD-10-CM | POA: Diagnosis not present

## 2024-02-29 DIAGNOSIS — Z9981 Dependence on supplemental oxygen: Secondary | ICD-10-CM | POA: Insufficient documentation

## 2024-02-29 DIAGNOSIS — D5700 Hb-SS disease with crisis, unspecified: Secondary | ICD-10-CM | POA: Diagnosis present

## 2024-02-29 DIAGNOSIS — Z21 Asymptomatic human immunodeficiency virus [HIV] infection status: Secondary | ICD-10-CM | POA: Insufficient documentation

## 2024-02-29 DIAGNOSIS — Z86711 Personal history of pulmonary embolism: Secondary | ICD-10-CM | POA: Insufficient documentation

## 2024-02-29 DIAGNOSIS — F411 Generalized anxiety disorder: Secondary | ICD-10-CM | POA: Insufficient documentation

## 2024-02-29 DIAGNOSIS — G8929 Other chronic pain: Secondary | ICD-10-CM | POA: Diagnosis not present

## 2024-02-29 LAB — COMPREHENSIVE METABOLIC PANEL WITH GFR
ALT: 18 U/L (ref 0–44)
AST: 31 U/L (ref 15–41)
Albumin: 3.6 g/dL (ref 3.5–5.0)
Alkaline Phosphatase: 71 U/L (ref 38–126)
Anion gap: 7 (ref 5–15)
BUN: 9 mg/dL (ref 6–20)
CO2: 24 mmol/L (ref 22–32)
Calcium: 8.2 mg/dL — ABNORMAL LOW (ref 8.9–10.3)
Chloride: 110 mmol/L (ref 98–111)
Creatinine, Ser: 0.7 mg/dL (ref 0.61–1.24)
GFR, Estimated: >60 mL/min (ref >60)
Glucose, Bld: 95 mg/dL (ref 70–99)
Potassium: 3.5 mmol/L (ref 3.5–5.1)
Sodium: 142 mmol/L (ref 135–145)
Total Bilirubin: 6.8 mg/dL — ABNORMAL HIGH (ref 0.0–1.2)
Total Protein: 7.3 g/dL (ref 6.5–8.1)

## 2024-02-29 LAB — CBC WITH DIFFERENTIAL/PLATELET
Abs Immature Granulocytes: 0.02 K/uL (ref 0.00–0.07)
Basophils Absolute: 0 K/uL (ref 0.0–0.1)
Basophils Relative: 0 %
Eosinophils Absolute: 0.2 K/uL (ref 0.0–0.5)
Eosinophils Relative: 2 %
HCT: 21.5 % — ABNORMAL LOW (ref 39.0–52.0)
Hemoglobin: 7.5 g/dL — ABNORMAL LOW (ref 13.0–17.0)
Immature Granulocytes: 0 %
Lymphocytes Relative: 56 %
Lymphs Abs: 5.2 K/uL — ABNORMAL HIGH (ref 0.7–4.0)
MCH: 35.9 pg — ABNORMAL HIGH (ref 26.0–34.0)
MCHC: 34.9 g/dL (ref 30.0–36.0)
MCV: 102.9 fL — ABNORMAL HIGH (ref 80.0–100.0)
Monocytes Absolute: 1.4 K/uL — ABNORMAL HIGH (ref 0.1–1.0)
Monocytes Relative: 15 %
Neutro Abs: 2.5 K/uL (ref 1.7–7.7)
Neutrophils Relative %: 27 %
Platelets: 323 K/uL (ref 150–400)
RBC: 2.09 MIL/uL — ABNORMAL LOW (ref 4.22–5.81)
RDW: 27.9 % — ABNORMAL HIGH (ref 11.5–15.5)
Smear Review: NORMAL
WBC: 9.3 K/uL (ref 4.0–10.5)
nRBC: 18.9 % — ABNORMAL HIGH (ref 0.0–0.2)

## 2024-02-29 LAB — RETICULOCYTES
Immature Retic Fract: 26.6 % — ABNORMAL HIGH (ref 2.3–15.9)
RBC.: 2.04 MIL/uL — ABNORMAL LOW (ref 4.22–5.81)
Retic Count, Absolute: 319.6 K/uL — ABNORMAL HIGH (ref 19.0–186.0)
Retic Ct Pct: 15.7 % — ABNORMAL HIGH (ref 0.4–3.1)

## 2024-02-29 MED ORDER — HYDROMORPHONE HCL 1 MG/ML IJ SOLN
2.0000 mg | Freq: Once | INTRAMUSCULAR | Status: AC
  Administered 2024-02-29: 2 mg via INTRAVENOUS
  Filled 2024-02-29: qty 2

## 2024-02-29 MED ORDER — ONDANSETRON HCL 4 MG/2ML IJ SOLN: 4.0000 mg | Freq: Four times a day (QID) | INTRAMUSCULAR | Status: DC | PRN

## 2024-02-29 MED ORDER — POLYETHYLENE GLYCOL 3350 17 G PO PACK: 17.0000 g | PACK | Freq: Every day | ORAL | Status: DC | PRN

## 2024-02-29 MED ORDER — NALOXONE HCL 0.4 MG/ML IJ SOLN: 0.4000 mg | INTRAMUSCULAR | Status: DC | PRN

## 2024-02-29 MED ORDER — SODIUM CHLORIDE 0.45 % IV SOLN: INTRAVENOUS | Status: DC

## 2024-02-29 MED ORDER — ACETAMINOPHEN 500 MG PO TABS: 1000.0000 mg | ORAL_TABLET | Freq: Once | ORAL | Status: DC

## 2024-02-29 MED ORDER — SODIUM CHLORIDE 0.9 % IV SOLN
12.5000 mg | INTRAVENOUS | Status: DC | PRN
  Administered 2024-02-29: 12.5 mg via INTRAVENOUS
  Filled 2024-02-29: qty 12.5

## 2024-02-29 MED ORDER — SENNOSIDES-DOCUSATE SODIUM 8.6-50 MG PO TABS: 1.0000 | ORAL_TABLET | Freq: Two times a day (BID) | ORAL | Status: DC

## 2024-02-29 MED ORDER — DIPHENHYDRAMINE HCL 25 MG PO CAPS: 25.0000 mg | ORAL_CAPSULE | ORAL | Status: DC | PRN

## 2024-02-29 MED ORDER — SODIUM CHLORIDE 0.9% FLUSH: 10.0000 mL | INTRAVENOUS | Status: DC | PRN

## 2024-02-29 MED ORDER — HYDROMORPHONE HCL 1 MG/ML IJ SOLN
2.0000 mg | INTRAMUSCULAR | Status: AC | PRN
  Administered 2024-02-29 (×2): 2 mg via INTRAVENOUS
  Filled 2024-02-29 (×2): qty 2

## 2024-02-29 MED ORDER — DIPHENHYDRAMINE HCL 50 MG/ML IJ SOLN: 12.5000 mg | Freq: Once | INTRAMUSCULAR | Status: DC

## 2024-02-29 MED ORDER — HYDROMORPHONE HCL 1 MG/ML IJ SOLN: 1.0000 mg | Freq: Once | INTRAMUSCULAR | Status: DC

## 2024-02-29 MED ORDER — SODIUM CHLORIDE 0.9% FLUSH: 9.0000 mL | INTRAVENOUS | Status: DC | PRN

## 2024-02-29 MED ORDER — HYDROMORPHONE 1 MG/ML IV SOLN
INTRAVENOUS | Status: DC
  Administered 2024-02-29: 30 mg via INTRAVENOUS
  Administered 2024-02-29: 12.5 mg via INTRAVENOUS
  Filled 2024-02-29: qty 30

## 2024-02-29 MED ORDER — HEPARIN SOD (PORK) LOCK FLUSH 100 UNIT/ML IV SOLN
500.0000 [IU] | INTRAVENOUS | Status: DC | PRN
  Filled 2024-02-29: qty 5

## 2024-02-29 NOTE — Discharge Summary (Signed)
 Physician Discharge Summary  DAMASCUS FELDPAUSCH FMW:[id number] DOB: 1991/04/18 DOA: 02/29/2024  PCP: [name], [name], [name]  Admit date: 02/29/2024  Discharge date: 02/29/2024  Time spent: 30 minutes  Discharge Diagnoses:  Active Problems:   Sickle cell anemia with pain [facility])   Discharge Condition: Stable  Diet recommendation: Regular  History of present illness:  Martin [name] is a 33 y.o. 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  Last took his home pain medication this morning with no resolution to pain symptoms.   Hospital Course:  Martin [name] was admitted to the day hospital with sickle cell painful crisis. Patient was treated with IV fluid, weight based IV Dilaudid  PCA, clinician assisted doses as deemed appropriate, and other adjunct therapies per sickle cell pain management protocol. Martin [name] showed improvement symptomatically, pain improved from 10/10 to 6/10 at the time of discharge. Patient was discharged home in a hemodynamically stable condition. Martin [name] will follow-up at the clinic as previously scheduled, continue with home medications as per prior to admission.  Discharge Instructions We discussed the need for good hydration, monitoring of hydration status, avoidance of heat, cold, stress, and infection triggers. We discussed the need to be compliant with taking Hydrea  and other home medications. Martin [name] was reminded of the need to seek medical attention immediately if any symptom of bleeding, anemia, or infection occurs.  Discharge Exam: Vitals:   02/29/24 1230 02/29/24 1451   BP: 110/74 106/66  Pulse: 80 86  Resp: 12 12  Temp: 98.4 F (36.9 C) 98.3 F (36.8 C)  SpO2: (!) 89% (!) 87%    General appearance: alert, cooperative and no distress Eyes: conjunctivae/corneas clear. PERRL, EOM's intact. Fundi benign. Neck: no adenopathy, no carotid bruit, no JVD, supple, symmetrical, trachea midline and thyroid not enlarged, symmetric, no tenderness/mass/nodules Back: symmetric, no curvature. ROM normal. No CVA tenderness. Resp: clear to auscultation bilaterally Chest wall: no tenderness Cardio: regular rate and rhythm, S1, S2 normal, no murmur, click, rub or gallop GI: soft, non-tender; bowel sounds normal; no masses,  no organomegaly Extremities: extremities normal, atraumatic, no cyanosis or edema Pulses: 2+ and symmetric Skin: Skin color, texture, turgor normal. No rashes or lesions Neurologic: Grossly normal  Discharge Instructions     Diet - low sodium heart healthy   Complete by: As directed    Increase activity slowly   Complete by: As directed       Allergies as of 02/29/2024       Reactions   Ketorolac  Tromethamine  Swelling, Other (See Comments)   Patient reports facial edema and left arm edema after administration.   Tape Rash, Other (See Comments)   PLEASE DO NOT USE THE CLEAR, THICK, PLASTIC TAPE- only paper tape is tolerated    Wound Dressing Adhesive Rash   Bactoshield Chg [chlorhexidine  Gluconate] Other (See Comments)   Per patient it makes me breakout        Medication List     TAKE these medications    albuterol  108 (90 Base) MCG/ACT inhaler Commonly known as: VENTOLIN  HFA Inhale 2 puffs into the lungs every 6 (six) hours as needed for wheezing or shortness of breath.   cyclobenzaprine   5 MG tablet Commonly known as: FLEXERIL  Take 1 tablet (5 mg total) by mouth 2 (two) times daily as needed for muscle spasms.   dabigatran 150 MG Caps capsule Commonly known as: Pradaxa Take 1 capsule (150 mg total) by mouth 2 (two)  times daily.   dabigatran 150 MG Caps capsule Commonly known as: PRADAXA Take 1 capsule (150 mg total) by mouth every 12 (twelve) hours.   Deferasirox  360 MG Tabs Commonly known as: Jadenu  Take 2 tablets (720 mg total) by mouth daily.   diphenhydrAMINE  25 MG tablet Commonly known as: BENADRYL  Take 1 tablet (25 mg total) by mouth every 4 (four) hours as needed for itching.   folic acid  400 MCG tablet Commonly known as: FOLVITE  Take 1 tablet (400 mcg total) by mouth daily.   gabapentin  300 MG capsule Commonly known as: NEURONTIN  Take 1 capsule (300 mg total) by mouth at bedtime as needed (nerve pain).   guaiFENesin  600 MG 12 hr tablet Commonly known as: Mucinex  Take 1 tablet (600 mg total) by mouth 2 (two) times daily.   hydroxyurea  500 MG capsule Commonly known as: HYDREA  Take 4 capsules (2,000 mg total) by mouth at bedtime.   Narcan  4 MG/0.1ML Liqd nasal spray kit Generic drug: naloxone  Place 1 spray into the nose as needed (respiratory distress / opioid overdose).   oxyCODONE -acetaminophen  10-325 MG tablet Commonly known as: PERCOCET Take 1 tablet by mouth every 4 (four) hours as needed for pain.   pantoprazole  40 MG tablet Commonly known as: PROTONIX  Take 1 tablet (40 mg total) by mouth daily as needed (acid reflux).   senna-docusate 8.6-50 MG tablet Commonly known as: Senokot-S Take 2 tablets by mouth at bedtime.   sulfamethoxazole-trimethoprim 800-160 MG tablet Commonly known as: BACTRIM DS Take 1 tablet by mouth 2 (two) times daily.   triamcinolone  cream 0.1 % Commonly known as: KENALOG  Apply 1 Application topically 2 (two) times daily.   Triumeq  600-50-300 MG tablet Generic drug: abacavir -dolutegravir -lamiVUDine  Take 1 tablet by mouth daily.   Vitamin D3 125 MCG (5000 UT) Caps Take 5,000 Units by mouth every Tuesday.       Allergies  Allergen Reactions   Ketorolac  Tromethamine  Swelling and Other (See Comments)    Patient reports facial edema  and left arm edema after administration.   Tape Rash and Other (See Comments)    PLEASE DO NOT USE THE CLEAR, THICK, PLASTIC TAPE- only paper tape is tolerated    Wound Dressing Adhesive Rash   Bactoshield Chg [Chlorhexidine  Gluconate] Other (See Comments)    Per patient it makes me breakout     Significant Diagnostic Studies: DG Chest Portable 1 View Result Date: 02/29/2024 CLINICAL DATA:  Sickle cell crisis.  Hypoxia. EXAM: PORTABLE CHEST 1 VIEW COMPARISON:  02/09/2024 FINDINGS: The cardio pericardial silhouette is enlarged. Subtle parahilar opacities bilaterally evident, decreased in the interval. Interstitial markings are diffusely coarsened with chronic features. Right Port-A-Cath again noted. Telemetry leads overlie the chest. IMPRESSION: Interval decrease in parahilar opacities suggesting improving edema. Electronically Signed   By: Camellia Candle M.D.   On: 02/29/2024 06:14   ECHOCARDIOGRAM COMPLETE Result Date: 02/09/2024    ECHOCARDIOGRAM REPORT   Patient Name:   MARCKUS HANOVER Date of Exam: 02/09/2024 Medical Rec #:  [id number]           Height:       75.0 in Accession #:    [id number]          Weight:  139.5 lb Date of Birth:  1990/08/21           BSA:          1.883 m Patient Age:    33 years            BP:           114/78 mmHg Patient Gender: M                   HR:           97 bpm. Exam Location:  Inpatient Procedure: 2D Echo, Cardiac Doppler and Color Doppler (Both Spectral and Color            Flow Doppler were utilized during procedure). Indications:    R07.9* Chest pain, unspecified  History:        Patient has prior history of Echocardiogram examinations, most                 recent 11/08/2023. Signs/Symptoms:Murmur and Chest Pain. HIV.                 Sickle cell anemia. Severe TR. Pulmonary embolus.  Sonographer:    Ellouise Mose RDCS Referring Phys: [phone] [name] COVER IMPRESSIONS  1. Left ventricular ejection fraction, by estimation, is 65 to 70%. The left ventricle  has normal function. The left ventricle has no regional wall motion abnormalities. There is mild concentric left ventricular hypertrophy. Left ventricular diastolic parameters were normal. There is the interventricular septum is flattened in systole and diastole, consistent with right ventricular pressure and volume overload.  2. Right ventricular systolic function is mildly reduced. The right ventricular size is severely enlarged. There is severely elevated pulmonary artery systolic pressure. The estimated right ventricular systolic pressure is 94.9 mmHg.  3. Right atrial size was severely dilated.  4. The mitral valve is abnormal. Moderate mitral valve regurgitation. No evidence of mitral stenosis.  5. Tricuspid valve regurgitation is moderate to severe.  6. The aortic valve is tricuspid. Aortic valve regurgitation is not visualized. No aortic stenosis is present.  7. The inferior vena cava is dilated in size with <50% respiratory variability, suggesting right atrial pressure of 15 mmHg. Comparison(s): Prior images reviewed side by side. Changes from prior study are noted. RV enlarged, RVSP higher compared to prior--now severely dilated RV with RVSP 95 mmHg. Findings communicated to Dr. Jegede. FINDINGS  Left Ventricle: Left ventricular ejection fraction, by estimation, is 65 to 70%. The left ventricle has normal function. The left ventricle has no regional wall motion abnormalities. The left ventricular internal cavity size was normal in size. There is  mild concentric left ventricular hypertrophy. The interventricular septum is flattened in systole and diastole, consistent with right ventricular pressure and volume overload. Left ventricular diastolic parameters were normal. Right Ventricle: The right ventricular size is severely enlarged. No increase in right ventricular wall thickness. Right ventricular systolic function is mildly reduced. There is severely elevated pulmonary artery systolic pressure. The  tricuspid regurgitant velocity is 4.47 m/s, and with an assumed right atrial pressure of 15 mmHg, the estimated right ventricular systolic pressure is 94.9 mmHg. Left Atrium: Left atrial size was normal in size. Right Atrium: Right atrial size was severely dilated. Pericardium: Trivial pericardial effusion is present. Mitral Valve: Mitral valve bowing without frank prolapse. The mitral valve is abnormal. Moderate mitral valve regurgitation. No evidence of mitral valve stenosis. Tricuspid Valve: The tricuspid valve is grossly normal. Tricuspid valve regurgitation is moderate to severe. No  evidence of tricuspid stenosis. Aortic Valve: The aortic valve is tricuspid. Aortic valve regurgitation is not visualized. No aortic stenosis is present. Pulmonic Valve: The pulmonic valve was normal in structure. Pulmonic valve regurgitation is trivial. No evidence of pulmonic stenosis. Aorta: The aortic root, ascending aorta, aortic arch and descending aorta are all structurally normal, with no evidence of dilitation or obstruction. Venous: The inferior vena cava is dilated in size with less than 50% respiratory variability, suggesting right atrial pressure of 15 mmHg. IAS/Shunts: There is left bowing of the interatrial septum, suggestive of elevated right atrial pressure. No atrial level shunt detected by color flow Doppler.  LEFT VENTRICLE PLAX 2D LVIDd:         4.30 cm     Diastology LVIDs:         2.60 cm     LV e' medial:    7.18 cm/s LV PW:         1.10 cm     LV E/e' medial:  9.2 LV IVS:        1.20 cm     LV e' lateral:   14.50 cm/s LVOT diam:     2.40 cm     LV E/e' lateral: 4.5 LV SV:         75 LV SV Index:   40 LVOT Area:     4.52 cm LV IVRT:       81 msec  LV Volumes (MOD) LV vol d, MOD A2C: 99.2 ml LV vol d, MOD A4C: 39.1 ml LV vol s, MOD A2C: 26.7 ml LV vol s, MOD A4C: 10.3 ml LV SV MOD A2C:     72.5 ml LV SV MOD A4C:     39.1 ml LV SV MOD BP:      48.7 ml RIGHT VENTRICLE             IVC RV S prime:     14.30 cm/s   IVC diam: 3.00 cm TAPSE (M-mode): 1.3 cm LEFT ATRIUM           Index        RIGHT ATRIUM           Index LA diam:      3.50 cm 1.86 cm/m   RA Area:     25.90 cm LA Vol (A2C): 37.6 ml 19.97 ml/m  RA Volume:   95.30 ml  50.61 ml/m LA Vol (A4C): 33.9 ml 18.00 ml/m  AORTIC VALVE             PULMONIC VALVE LVOT Vmax:   107.00 cm/s PR End Diast Vel: 2.81 msec LVOT Vmean:  67.900 cm/s LVOT VTI:    0.166 m  AORTA Ao Root diam: 3.10 cm Ao Asc diam:  3.30 cm MITRAL VALVE                  TRICUSPID VALVE MV Area (PHT): 3.99 cm       TR Peak grad:   79.9 mmHg MV Decel Time: 190 msec       TR Vmax:        447.00 cm/s MR Peak grad:    83.5 mmHg MR Mean grad:    46.0 mmHg    SHUNTS MR Vmax:         457.00 cm/s  Systemic VTI:  0.17 m MR Vmean:        317.0 cm/s   Systemic Diam: 2.40 cm MR PISA:  1.01 cm MR PISA Eff ROA: 8 mm MR PISA Radius:  0.40 cm MV E velocity: 65.75 cm/s MV A velocity: 54.80 cm/s MV E/A ratio:  1.20 Shelda Bruckner MD Electronically signed by Shelda Bruckner MD Signature Date/Time: 02/09/2024/3:51:44 PM    Final    DG CHEST PORT 1 VIEW Result Date: 02/09/2024 CLINICAL DATA:  Increasing shortness of breath EXAM: PORTABLE CHEST 1 VIEW COMPARISON:  Film from the previous day. FINDINGS: Cardiac shadow is enlarged but stable accentuated by the portable technique. Right chest wall port is again seen and stable. Increasing central vascular congestion is noted. Increased airspace opacity on the right is noted likely representing edema. No acute bony abnormality is seen. IMPRESSION: Increasing vascular congestion and likely edema particularly on the right. Electronically Signed   By: Oneil Devonshire M.D.   On: 02/09/2024 02:18   CT Angio Chest PE W and/or Wo Contrast Result Date: 02/08/2024 EXAM: CTA CHEST, ABDOMEN AND PELVIS WITHOUT AND WITH CONTRAST 02/08/2024 03:23:01 AM TECHNIQUE: CTA of the chest was performed without and with the administration of intravenous contrast. CTA of the  abdomen and pelvis was performed without and with the administration of intravenous contrast. Multiplanar reformatted images are provided for review. MIP images are provided for review. Automated exposure control, iterative reconstruction, and/or weight based adjustment of the mA/kV was utilized to reduce the radiation dose to as low as reasonably achievable. COMPARISON: None available. CLINICAL HISTORY: Pulmonary embolism (PE) suspected, high prob. Placed patient on 4L . Patient is normally on this amount when he is in crisis. O2 is staying around 90-92 which patient states he stays in the low 90s normally. 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 FINDINGS: VASCULATURE: AORTA: No acute finding. No abdominal aortic aneurysm. No dissection. PULMONARY ARTERIES: The main pulmonary artery is enlarged in caliber measuring up to 4 cm. Interval increase in size of a right lower lobe segmental and subsegmental pulmonary embolus (11:227). Question interval development of a left upper lobe subsegmental pulmonary embolus (11:100). GREAT VESSELS OF AORTIC ARCH: No acute finding. No dissection. No arterial occlusion or significant stenosis. CELIAC TRUNK: No acute finding. No occlusion or significant stenosis. SUPERIOR MESENTERIC ARTERY: No acute finding. No occlusion or significant stenosis. INFERIOR MESENTERIC ARTERY: No acute finding. No occlusion or significant stenosis. RENAL ARTERIES: No acute finding. No occlusion or significant stenosis. ILIAC ARTERIES: No acute finding. No occlusion or significant stenosis. CHEST: MEDIASTINUM: Stable Enlarged right hilar lymph nodes measuring up to 2.3 cm. Stable Enlarged left hilar lymph nodes measuring up to 1 cm. Stable Enlarged prevascular lymph node measuring up to 1.2 cm. Stable enlarged right heart Otherwise heart and pericardium demonstrate no acute abnormality. LUNGS AND PLEURA: Chronic architectural distortion.  Mild paraseptal emphysematous changes. Persistent interlobular septal wall thickening with mosaic attenuation of the lungs. Interval increase of patchy ground glass airspace opacities that are most prominent along the right middle lobe. Interlobular septal wall thickening. No pulmonary infarction. No evidence of pleural effusion or pneumothorax. THORACIC BONES AND SOFT TISSUES: Slight heterogeneity of the axial and appendicular skeleton likely related to sickle cell disease. Prominent but not enlarged bilateral axillary lymph nodes. ABDOMEN AND PELVIS: LIVER: Portal edema. Multiple vague pericentimeter hepatic lesions more prominent along the left hepatic lobe (2:27). Enlarged liver measuring up to 22 cm. GALLBLADDER AND BILE DUCTS: Status post cholecystectomy. No biliary ductal dilatation. 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 PANCREAS: The pancreas is unremarkable. ADRENAL GLANDS: Bilateral adrenal glands demonstrate no acute abnormality. KIDNEYS, URETERS AND BLADDER: No stones in the kidneys or ureters. No hydronephrosis. No perinephric or periureteral stranding. Urinary bladder is unremarkable. GI AND BOWEL: Stomach and duodenal sweep demonstrate no acute abnormality. There is no bowel obstruction. No abnormal bowel wall thickening or distension. The appendix is not definitely identified, with no inflammatory changes in the right lower quadrant to suggest acute appendicitis. REPRODUCTIVE: Reproductive organs are unremarkable. PERITONEUM AND RETROPERITONEUM: No ascites or free air. LYMPH NODES: No lymphadenopathy. ABDOMINAL BONES AND SOFT TISSUES: Slight heterogeneity of the axial and appendicular skeleton likely related to sickle cell disease. No acute soft tissue abnormality. IMPRESSION: 1. Interval increase in size of a right lower lobe segmental and subsegmental pulmonary embolus, with possible new left upper lobe subsegmental pulmonary embolus. No  pulmonary infarction. Chronic right heart failure with no findings to suggest worsened acute right heart strain. 2. Persistent mosaic attenuation and interlobular septal thickening of the lungs with superimposed interval increase of patchy ground glass airspace opacities that are most prominent along the right middle lobe has been questioned for infection /inflammation . 3. Enlarged main pulmonary artery measuring up to 4 cm. Finding consistent with pulmonary hypertension. 4. Hepatomegaly with multiple vague pericentimeter hepatic lesions more prominent along the left hepatic lobe and portal edema. Question developing nutmeg liver morphology. 5. Stable and large bilateral hilar and mediastinal lymph nodes, as well as prominence but not enlarged bilateral axillary lymph nodes. 6. Other, non-acute and/or normal findings as above. Electronically signed by: Morgane Naveau MD 02/08/2024 03:45 AM EDT RP Workstation: [id number]   CT ABDOMEN PELVIS W CONTRAST Result Date: 02/08/2024 EXAM: CTA CHEST, ABDOMEN AND PELVIS WITHOUT AND WITH CONTRAST 02/08/2024 03:23:01 AM TECHNIQUE: CTA of the chest was performed without and with the administration of intravenous contrast. CTA of the abdomen and pelvis was performed without and with the administration of intravenous contrast. Multiplanar reformatted images are provided for review. MIP images are provided for review. Automated exposure control, iterative reconstruction, and/or weight based adjustment of the mA/kV was utilized to reduce the radiation dose to as low as reasonably achievable. COMPARISON: None available. CLINICAL HISTORY: Pulmonary embolism (PE) suspected, high prob. Placed patient on 4L [place]. Patient is normally on this amount when he is in crisis. O2 is staying around 90-92 which patient states he stays in the low 90s normally. 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 FINDINGS: VASCULATURE: AORTA:  No acute finding. No abdominal aortic aneurysm. No dissection. PULMONARY ARTERIES: The main pulmonary artery is enlarged in caliber measuring up to 4 cm. Interval increase in size of a right lower lobe segmental and subsegmental pulmonary embolus (11:227). Question interval development of a left upper lobe subsegmental pulmonary embolus (11:100). GREAT VESSELS OF AORTIC ARCH: No acute finding. No dissection. No arterial occlusion or significant stenosis. CELIAC TRUNK: No acute finding. No occlusion or significant stenosis. SUPERIOR MESENTERIC ARTERY: No acute finding. No occlusion or significant stenosis. INFERIOR MESENTERIC ARTERY: No acute finding. No occlusion or significant stenosis. RENAL ARTERIES: No acute finding. No occlusion or significant stenosis. ILIAC ARTERIES: No acute finding. No occlusion or significant stenosis. CHEST: MEDIASTINUM: Stable Enlarged right hilar lymph nodes measuring up to 2.3 cm. Stable Enlarged left hilar lymph nodes measuring up to 1 cm. Stable Enlarged prevascular lymph node measuring up to 1.2 cm. Stable enlarged right heart Otherwise heart and pericardium demonstrate  no acute abnormality. LUNGS AND PLEURA: Chronic architectural distortion. Mild paraseptal emphysematous changes. Persistent interlobular septal wall thickening with mosaic attenuation of the lungs. Interval increase of patchy ground glass airspace opacities that are most prominent along the right middle lobe. Interlobular septal wall thickening. No pulmonary infarction. No evidence of pleural effusion or pneumothorax. THORACIC BONES AND SOFT TISSUES: Slight heterogeneity of the axial and appendicular skeleton likely related to sickle cell disease. Prominent but not enlarged bilateral axillary lymph nodes. ABDOMEN AND PELVIS: LIVER: Portal edema. Multiple vague pericentimeter hepatic lesions more prominent along the left hepatic lobe (2:27). Enlarged liver measuring up to 22 cm. GALLBLADDER AND BILE DUCTS: Status post  cholecystectomy. No biliary ductal dilatation. 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 PANCREAS: The pancreas is unremarkable. ADRENAL GLANDS: Bilateral adrenal glands demonstrate no acute abnormality. KIDNEYS, URETERS AND BLADDER: No stones in the kidneys or ureters. No hydronephrosis. No perinephric or periureteral stranding. Urinary bladder is unremarkable. GI AND BOWEL: Stomach and duodenal sweep demonstrate no acute abnormality. There is no bowel obstruction. No abnormal bowel wall thickening or distension. The appendix is not definitely identified, with no inflammatory changes in the right lower quadrant to suggest acute appendicitis. REPRODUCTIVE: Reproductive organs are unremarkable. PERITONEUM AND RETROPERITONEUM: No ascites or free air. LYMPH NODES: No lymphadenopathy. ABDOMINAL BONES AND SOFT TISSUES: Slight heterogeneity of the axial and appendicular skeleton likely related to sickle cell disease. No acute soft tissue abnormality. IMPRESSION: 1. Interval increase in size of a right lower lobe segmental and subsegmental pulmonary embolus, with possible new left upper lobe subsegmental pulmonary embolus. No pulmonary infarction. Chronic right heart failure with no findings to suggest worsened acute right heart strain. 2. Persistent mosaic attenuation and interlobular septal thickening of the lungs with superimposed interval increase of patchy ground glass airspace opacities that are most prominent along the right middle lobe has been questioned for infection /inflammation . 3. Enlarged main pulmonary artery measuring up to 4 cm. Finding consistent with pulmonary hypertension. 4. Hepatomegaly with multiple vague pericentimeter hepatic lesions more prominent along the left hepatic lobe and portal edema. Question developing nutmeg liver morphology. 5. Stable and large bilateral hilar and mediastinal lymph nodes, as well as prominence but not enlarged  bilateral axillary lymph nodes. 6. Other, non-acute and/or normal findings as above. Electronically signed by: Morgane Naveau MD 02/08/2024 03:45 AM EDT RP Workstation: [id number]   DG Chest Portable 1 View Result Date: 02/08/2024 EXAM: 1 VIEW(S) XRAY OF THE CHEST 02/08/2024 12:28:00 AM COMPARISON: 01/16/2024 CLINICAL HISTORY: sob beyond baseline, chest pain. Pt having sickle cell criss, SOB and chest pain. FINDINGS: LINES, TUBES AND DEVICES: Similar positioning of the right chest port-a-cath with loop catheter tubing at the thoracic inlet. The tip is in the low SVC. LUNGS AND PLEURA: Prominent central pulmonary arteries. Similar bilateral interstitial coarsening. Question vague ground-glass opacities in the mid lungs. Right apical pleural parenchymal scarring. No pulmonary edema. No pleural effusion. No pneumothorax. HEART AND MEDIASTINUM: Stable cardiomediastinal silhouette. BONES AND SOFT TISSUES: No acute osseous abnormality. IMPRESSION: 1. Similar interstitial coarsening with possible vague mid-lung ground-glass opacities. This may be projectional artifact, or due to edema or infection. Electronically signed by: Norman Gatlin MD 02/08/2024 12:36 AM EDT RP Workstation: [id number]    Signed:  [name] Cover NP   02/29/2024, 3:30 PM

## 2024-02-29 NOTE — ED Notes (Signed)
 Patient expressed to writer that he would like to be discharged but transferred to the sickle cell clinic since his port is already accessed. Writer told this to charge RN and charge RN explained that he will need to speak to the doctor about this. Patients RN has been notified of this conversation and will be in contact with patients provider.

## 2024-02-29 NOTE — Telephone Encounter (Signed)
 Patient called requesting to come to the day hospital. Patient reports generalized pain rated 10/10. Reports taking Oxycodone  10 mg at 6:00 am. Patient is currently in the ED. Denies fever, chest pain, nausea, vomiting, diarrhea, abdominal pain and priapism.  Admits to having transportation without driving self. Onyeje, NP notified and advised that patient should stay in the ED if he wants to be admitted to the hospital. If patient does not desire admission, patient can come to the day hospital for pain management. Patient reports that he does not want to be admitted and wants to come to the day hospital. Patient to be transferred from the ED to the day hospital for sickle pain management.

## 2024-02-29 NOTE — Discharge Instructions (Signed)
 Go directly to sickle cell clinic

## 2024-02-29 NOTE — Progress Notes (Signed)
 Patient admitted to the day hospital from [facility] for sickle cell pain. Initially, patient reported generalized pain rated 10/10. For pain management, patient placed on Sickle Cell Dose Dilaudid  PCA and hydrated with IV fluids. At discharge, patient rated pain at 4/10. Vital signs stable. AVS offered but patient refused. Patient alert, oriented and ambulatory at discharge.

## 2024-02-29 NOTE — ED Provider Notes (Signed)
 [place] EMERGENCY DEPARTMENT AT [facility] Provider Note   CSN: [id number] Arrival date & time: 02/29/24  9985     Patient presents with: Sickle Cell Pain Crisis   Martin [name] is a 33 y.o. male.  Patient with past history significant for sickle cell anemia, PE, chronic hip pain presents to the emergency department with concerns of sickle cell pain crisis.  He reports trying to take his home medications without significant improvement in pain.  He denies any feelings of chest pain or shortness of breath at this time.  Patient requires baseline 2 L nasal cannula at home during pain crisis as he typically will come hypoxic during these episodes.  Patient was noted to be hypoxic at approximately 88% on room air on arrival.    Sickle Cell Pain Crisis      Prior to Admission medications   Medication Sig Start Date End Date Taking? Authorizing Provider  abacavir -dolutegravir -lamiVUDine  (TRIUMEQ ) 600-50-300 MG tablet Take 1 tablet by mouth daily. 08/13/23   Pearlean Manus, MD  albuterol  (VENTOLIN  HFA) 108 (90 Base) MCG/ACT inhaler Inhale 2 puffs into the lungs every 6 (six) hours as needed for wheezing or shortness of breath. 08/13/23   Pearlean Manus, MD  Cholecalciferol  (VITAMIN D3) 125 MCG (5000 UT) CAPS Take 5,000 Units by mouth every Tuesday.    [provider]  cyclobenzaprine  (FLEXERIL ) 5 MG tablet Take 1 tablet (5 mg total) by mouth 2 (two) times daily as needed for muscle spasms. 02/04/24   [name], [name], [name]  dabigatran (PRADAXA) 150 MG CAPS capsule Take 1 capsule (150 mg total) by mouth 2 (two) times daily. 02/09/24   Jegede, Olugbemiga E, MD  dabigatran (PRADAXA) 150 MG CAPS capsule Take 1 capsule (150 mg total) by mouth every 12 (twelve) hours. Patient not taking: Reported on 02/18/2024 02/16/24   Cherylene [name] HERO, NP  Deferasirox  (JADENU ) 360 MG TABS Take 2 tablets (720 mg total) by mouth daily. Patient not taking: Reported on 02/18/2024  11/05/23   [name], [name], [name]  diphenhydrAMINE  (BENADRYL ) 25 MG tablet Take 1 tablet (25 mg total) by mouth every 4 (four) hours as needed for itching. 08/13/23   Pearlean Manus, MD  folic acid  (FOLVITE ) 400 MCG tablet Take 1 tablet (400 mcg total) by mouth daily. 08/13/23   Pearlean Manus, MD  gabapentin  (NEURONTIN ) 300 MG capsule Take 1 capsule (300 mg total) by mouth at bedtime as needed (nerve pain). 08/13/23   Pearlean Manus, MD  guaiFENesin  (MUCINEX ) 600 MG 12 hr tablet Take 1 tablet (600 mg total) by mouth 2 (two) times daily. Patient not taking: Reported on 02/18/2024 08/13/23 08/12/24  Pearlean Manus, MD  hydroxyurea  (HYDREA ) 500 MG capsule Take 4 capsules (2,000 mg total) by mouth at bedtime. 08/13/23   Pearlean Manus, MD  NARCAN  4 MG/0.1ML LIQD nasal spray kit Place 1 spray into the nose as needed (respiratory distress / opioid overdose). 01/07/23   [provider]  oxyCODONE -acetaminophen  (PERCOCET) 10-325 MG tablet Take 1 tablet by mouth every 4 (four) hours as needed for pain. 02/19/24   [name], [name], [name]  pantoprazole  (PROTONIX ) 40 MG tablet Take 1 tablet (40 mg total) by mouth daily as needed (acid reflux). Patient not taking: Reported on 02/18/2024 08/13/23   Pearlean Manus, MD  senna-docusate (SENOKOT-S) 8.6-50 MG tablet Take 2 tablets by mouth at bedtime. 08/13/23   Pearlean Manus, MD  sulfamethoxazole-trimethoprim (BACTRIM DS) 800-160 MG tablet Take 1 tablet by mouth 2 (two) times daily.  Patient not taking: Reported on 02/18/2024 02/04/24   [name], [name], [name]  triamcinolone  cream (KENALOG ) 0.1 % Apply 1 Application topically 2 (two) times daily. Patient not taking: Reported on 02/18/2024 01/12/23   Tilford Bertram HERO, [name]    Allergies: Ketorolac  tromethamine , Tape, Wound dressing adhesive, and Bactoshield chg [chlorhexidine  gluconate]    Review of Systems  Constitutional:        Pain crisis  All other systems reviewed and are  negative.   Updated Vital Signs BP 113/74   Pulse 95   Temp 98.5 F (36.9 C) (Oral)   Resp 15   Ht 6' 3 (1.905 m)   Wt 64.8 kg   SpO2 90%   BMI 17.86 kg/m   Physical Exam Vitals and nursing note reviewed.  Constitutional:      General: He is not in acute distress.    Appearance: He is well-developed.  HENT:     Head: Normocephalic and atraumatic.  Eyes:     Conjunctiva/sclera: Conjunctivae normal.  Cardiovascular:     Rate and Rhythm: Normal rate and regular rhythm.     Heart sounds: No murmur heard. Pulmonary:     Effort: Pulmonary effort is normal. No respiratory distress.     Breath sounds: No wheezing or rales.  Abdominal:     General: There is no distension.     Palpations: Abdomen is soft.     Tenderness: There is no abdominal tenderness. There is no guarding.  Musculoskeletal:        General: No swelling.     Cervical back: Neck supple.  Skin:    General: Skin is warm and dry.     Capillary Refill: Capillary refill takes less than 2 seconds.  Neurological:     Mental Status: He is alert.  Psychiatric:        Mood and Affect: Mood normal.     (all labs ordered are listed, but only abnormal results are displayed) Labs Reviewed  COMPREHENSIVE METABOLIC PANEL WITH GFR - Abnormal; Notable for the following components:      Result Value   Calcium  8.2 (*)    Total Bilirubin 6.8 (*)    All other components within normal limits  CBC WITH DIFFERENTIAL/PLATELET - Abnormal; Notable for the following components:   RBC 2.09 (*)    Hemoglobin 7.5 (*)    HCT 21.5 (*)    MCV 102.9 (*)    MCH 35.9 (*)    RDW 27.9 (*)    nRBC 18.9 (*)    Lymphs Abs 5.2 (*)    Monocytes Absolute 1.4 (*)    All other components within normal limits  RETICULOCYTES - Abnormal; Notable for the following components:   Retic Ct Pct 15.7 (*)    RBC. 2.04 (*)    Retic Count, Absolute 319.6 (*)    Immature Retic Fract 26.6 (*)    All other components within normal limits     EKG: EKG Interpretation Date/Time:  Tuesday February 29 2024 01:58:47 EST Ventricular Rate:  76 PR Interval:  183 QRS Duration:  95 QT Interval:  437 QTC Calculation: 492 R Axis:   80  Text Interpretation: Sinus rhythm Abnormal lateral Q waves Repol abnrm, probable ischemia, anterior lds Prolonged QT interval When compared with ECG of 02/12/2024, T wave inversion Lateral leads is slightly more pronounced Confirmed by Raford Lenis ([id number]) on 02/29/2024 3:42:09 AM  Radiology: No results found.   .Critical Care  Performed by: Stephanye Finnicum A, PA-C Authorized  by: Dempsy Damiano A, PA-C   Critical care provider statement:    Critical care time (minutes):  34   Critical care start time:  02/29/2024 5:30 AM   Critical care end time:  02/29/2024 6:04 AM   Critical care time was exclusive of:  Separately billable procedures and treating other patients   Critical care was time spent personally by me on the following activities:  Development of treatment plan with patient or surrogate, discussions with consultants, evaluation of patient's response to treatment, examination of patient, obtaining history from patient or surrogate, re-evaluation of patient's condition, ordering and review of laboratory studies and ordering and review of radiographic studies   I assumed direction of critical care for this patient from another provider in my specialty: no     Care discussed with: admitting provider      Medications Ordered in the ED  0.45 % sodium chloride  infusion ( Intravenous New Bag/Given 02/29/24 0155)  diphenhydrAMINE  (BENADRYL ) 12.5 mg in sodium chloride  0.9 % 50 mL IVPB (0 mg Intravenous Stopped 02/29/24 0320)  HYDROmorphone  (DILAUDID ) injection 2 mg (2 mg Intravenous Given 02/29/24 0142)  HYDROmorphone  (DILAUDID ) injection 2 mg (2 mg Intravenous Given 02/29/24 0353)                                    Medical Decision Making Amount and/or Complexity of Data Reviewed Labs:  ordered. Radiology: ordered.  Risk Prescription drug management.   This patient presents to the ED for concern of sickle cell pain, this involves an extensive number of treatment options, and is a complaint that carries with it a high risk of complications and morbidity.  The differential diagnosis includes sickle cell pain crisis, ACS, pneumonia, PE   Co morbidities that complicate the patient evaluation  Sickle cell anemia, heart murmur, pulmonary embolism   Additional history obtained:  Additional history obtained from chart review   Lab Tests:  I Ordered, and personally interpreted labs.  The pertinent results include: CBC shows stable hemoglobin at 7.5, CMP without elevated bilirubin at 6.8, reticulocyte count unremarkable and at baseline   Cardiac Monitoring: / EKG:  The patient was maintained on a cardiac monitor.  I personally viewed and interpreted the cardiac monitored which showed an underlying rhythm of: Sinus rhythm   Consultations Obtained:  I requested consultation with the hospitalist,  and discussed lab and imaging findings as well as pertinent plan - they recommend: Spoke with Dr. Franky, hospitalist,   Problem List / ED Course / Critical interventions / Medication management  Patient past history significant for sickle cell anemia, heart murmur, recently diagnosed pulmonary embolism presents to the emergency department with concerns of sickle cell pain crisis.  Endorses pain in his typical areas including his hips and low back.  Denies any difficulty breathing or hemoptysis.  Also denies any chest pain.  States he has been unable to get his pain under control with his home medications. On exam, patient is stable appearing but uncomfortable.  No significant changes in heart or lung sounds.  No appreciated cough on exam.  Vitals largely reassuring but patient having some episodes of hypoxia when on room air which is his baseline.  Patient placed on 3 L nasal  cannula. Labs largely at baseline.  Attempted multiple rounds of pain medications including Dilaudid  2 mg x 3 with only slight improvement in pain now rated an 8 out of 10.  Patient  does not feel comfortable being discharged home at his current pain level.  Consult to hospitalist placed for admission. Spoke with Dr. Franky, hospitalist, who will be admitting patient. Advised adding on CXR which has been added. I ordered medication including Dilaudid  x 3, fluid infusion, Benadryl  for sickle cell pain crisis Reevaluation of the patient after these medicines showed that the patient stayed the same I have reviewed the patients home medicines and have made adjustments as needed   Social Determinants of Health:  None   Test / Admission - Considered:  Patient requiring admission.  Final diagnoses:  Sickle cell pain crisis [facility])    ED Discharge Orders     None          Cecily Legrand LABOR, PA-C 02/29/24 9394    Raford Lenis, MD 02/29/24 [phone]

## 2024-02-29 NOTE — H&P (Signed)
 [facility] History and Physical  Kaynan DHRUVA ORNDOFF FMW:[id number] DOB: 11-13-1990 DOA: 02/29/2024  PCP: [name], [name], [name]   Chief Complaint:  No chief complaint on file.   HPI: Martin [name] is a 33 y.o. 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  Last took his home pain medication this morning with no resolution to pain symptoms.   Systemic Review: General: The patient denies anorexia, fever, weight loss Cardiac: Denies chest pain, syncope, palpitations, pedal edema  Respiratory: Denies cough, shortness of breath, wheezing GI: Denies severe indigestion/heartburn, abdominal pain, nausea, vomiting, diarrhea and constipation GU: Denies hematuria, incontinence, dysuria  Musculoskeletal: Denies arthritis  Skin: Denies suspicious skin lesions Neurologic: Denies focal weakness or numbness, change in vision  Past Medical History:  Diagnosis Date   Anxiety    Chest pain 02/04/2024   HIV (human immunodeficiency virus infection) (HCC)    Proteinuria    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HCC)    Vitamin D  deficiency 10/2018    Past Surgical History:  Procedure Laterality Date   IR IMAGING GUIDED PORT INSERTION  08/29/2019   IR REMOVAL TUN ACCESS W/ PORT W/O FL MOD SED  08/30/2020   TEE WITHOUT CARDIOVERSION N/A 08/30/2020   Procedure: TRANSESOPHAGEAL ECHOCARDIOGRAM (TEE);  Surgeon: Mona Vinie BROCKS, MD;  Location: [facility] ENDOSCOPY;  Service: Cardiovascular;  Laterality: N/A;    Allergies  Allergen Reactions   Ketorolac  Tromethamine  Swelling and Other (See Comments)    Patient reports facial edema and  left arm edema after administration.   Tape Rash and Other (See Comments)    PLEASE DO NOT USE THE CLEAR, THICK, PLASTIC TAPE- only paper tape is tolerated    Wound Dressing Adhesive Rash   Bactoshield Chg [Chlorhexidine  Gluconate] Other (See Comments)    Per patient it makes me breakout    Family History  Problem Relation Age of Onset   Sickle cell trait Mother    Sickle cell trait Father    Birth defects Maternal Grandmother    Birth defects Paternal Grandmother       Prior to Admission medications   Medication Sig Start Date End Date Taking? Authorizing Provider  abacavir -dolutegravir -lamiVUDine  (TRIUMEQ ) 600-50-300 MG tablet Take 1 tablet by mouth daily. 08/13/23  Yes Emokpae, Courage, MD  apixaban  (ELIQUIS ) 5 MG TABS tablet Take 1 tablet (5 mg total) by mouth 2 (two) times daily. 08/13/23  Yes Emokpae, Courage, MD  cyclobenzaprine  (FLEXERIL ) 10 MG tablet Take 10 mg by mouth 2 (two) times daily as needed for muscle spasms. 12/22/22  Yes [provider]  diphenhydrAMINE  (BENADRYL ) 25 MG tablet Take 1 tablet (25 mg total) by mouth every 4 (four) hours as needed for itching. 08/13/23  Yes Pearlean Manus, MD  folic acid  (FOLVITE ) 400 MCG tablet Take 1 tablet (400 mcg total) by mouth daily. 08/13/23  Yes Pearlean Manus, MD  gabapentin  (NEURONTIN ) 300 MG capsule Take 1 capsule (300 mg total) by mouth at bedtime as needed (nerve pain). 08/13/23  Yes Emokpae, Courage, MD  hydroxyurea  (HYDREA ) 500 MG capsule Take 4 capsules (2,000 mg total) by mouth at bedtime. 08/13/23  Yes Pearlean Manus, MD  oxyCODONE -acetaminophen  (PERCOCET) 10-325 MG tablet  Take 1 tablet by mouth every 4 (four) hours as needed for pain. 12/03/23  Yes [name], [name], [name]  Vitamin D , Ergocalciferol , (DRISDOL ) 1.25 MG (50000 UNIT) CAPS capsule Take 1 capsule (50,000 Units total) by mouth every 7 (seven) days. 11/05/23  Yes [name], [name], [name]  albuterol  (VENTOLIN  HFA) 108 (90 Base) MCG/ACT inhaler Inhale 2  puffs into the lungs every 6 (six) hours as needed for wheezing or shortness of breath. Patient not taking: Reported on 10/20/2023 08/13/23   Pearlean Manus, MD  Deferasirox  (JADENU ) 360 MG TABS Take 2 tablets (720 mg total) by mouth daily. 11/05/23   [name], [name], [name]  guaiFENesin  (MUCINEX ) 600 MG 12 hr tablet Take 1 tablet (600 mg total) by mouth 2 (two) times daily. Patient not taking: Reported on 10/20/2023 08/13/23 08/12/24  Pearlean Manus, MD  NARCAN  4 MG/0.1ML LIQD nasal spray kit Place 1 spray into the nose as needed (respiratory distress / opioid overdose). 01/07/23   [provider]  pantoprazole  (PROTONIX ) 40 MG tablet Take 1 tablet (40 mg total) by mouth daily as needed (acid reflux). Patient not taking: Reported on 10/20/2023 08/13/23   Pearlean Manus, MD  senna-docusate (SENOKOT-S) 8.6-50 MG tablet Take 2 tablets by mouth at bedtime. Patient not taking: Reported on 10/20/2023 08/13/23   Pearlean Manus, MD  triamcinolone  cream (KENALOG ) 0.1 % Apply 1 Application topically 2 (two) times daily. Patient not taking: Reported on 10/27/2023 01/12/23   Tilford Bertram HERO, [name]  Vitamin D , Ergocalciferol , (DRISDOL ) 1.25 MG (50000 UNIT) CAPS capsule Take 1 capsule (50,000 Units total) by mouth every 7 (seven) days. 11/09/23   [name], [name], [name]     Physical Exam: Vitals:   02/29/24 1008 02/29/24 1106 02/29/24 1230  BP: 104/68  110/74  Pulse: 75  80  Resp: 16 16 12   Temp: 98.8 F (37.1 C)  98.4 F (36.9 C)  TempSrc: Temporal  Temporal  SpO2: 96% (!) 86% (!) 89%    General: Alert, awake, afebrile, anicteric, not in obvious distress HEENT: Normocephalic and Atraumatic, Mucous membranes pink                PERRLA; EOM intact; No scleral icterus,                 Nares: Patent, Oropharynx: Clear, Fair Dentition                 Neck: FROM, no cervical lymphadenopathy, thyromegaly, carotid bruit or JVD;  CHEST WALL: No tenderness  CHEST: Normal respiration, clear to  auscultation bilaterally  HEART: Regular rate and rhythm; no murmurs rubs or gallops  BACK: No kyphosis or scoliosis; no CVA tenderness  ABDOMEN: Positive Bowel Sounds, soft, non-tender; no masses, no organomegaly EXTREMITIES: No cyanosis, clubbing, or edema SKIN:  no rash or ulceration  CNS: Alert and Oriented x 4, Nonfocal exam, CN 2-12 intact  Labs on Admission:  Basic Metabolic Panel: Recent Labs  Lab 02/25/24 0242 02/29/24 0140  NA 140 142  K 3.9 3.5  CL 111 110  CO2 23 24  GLUCOSE 93 95  BUN 11 9  CREATININE 0.81 0.70  CALCIUM  8.1* 8.2*   Liver Function Tests: Recent Labs  Lab 02/25/24 0242 02/29/24 0140  AST 35 31  ALT 23 18  ALKPHOS 65 71  BILITOT 3.9* 6.8*  PROT 7.5 7.3  ALBUMIN 3.6 3.6   No results for input(s): LIPASE, AMYLASE in the last 168 hours. No results for input(s): AMMONIA in the last  168 hours. CBC: Recent Labs  Lab 02/25/24 0242 02/29/24 0140  WBC 11.8* 9.3  NEUTROABS 6.4 2.5  HGB 7.4* 7.5*  HCT 22.3* 21.5*  MCV 102.8* 102.9*  PLT 243 323   Cardiac Enzymes: No results for input(s): CKTOTAL, CKMB, CKMBINDEX, TROPONINI in the last 168 hours.  BNP (last 3 results) No results for input(s): BNP in the last 8760 hours.  ProBNP (last 3 results) Recent Labs    01/16/24 1816  PROBNP 2,143.0*    CBG: No results for input(s): GLUCAP in the last 168 hours.   Assessment/Plan Active Problems:   Sickle cell anemia with pain (HCC)  Admits to the Day Hospital for extended observation IVF 0 .45% Saline @ 125 mls/hour Weight based Dilaudid  PCA started within 30 minutes of admission Acetaminophen  1000 mg x 1 dose Labs: CBCD, CMP, Retic Count and LDH (completed in the ED today)  Monitor vitals very closely, Re-evaluate pain scale every hour 2 L of Oxygen  by [place] Patient will be re-evaluated for pain in the context of function and relationship to baseline as care progresses. If no significant relieve from pain (remains  above 5/10) will transfer patient to inpatient services for further evaluation and management  Code Status: Full  Family Communication: None  DVT Prophylaxis: Ambulate as tolerated   Time spent: 35 Minutes  [name] Cover NP   If 7PM-7AM, please contact night-coverage [email or web address] 02/29/2024, 1:41 PM

## 2024-02-29 NOTE — ED Notes (Addendum)
 Pt requesting to be d/c to [facility] vs admitted.  Dr Cherylene stated she spoke with nurse, Crystal, at 0715 giving ok to send to [facility].

## 2024-02-29 NOTE — ED Triage Notes (Signed)
 Generalized pain associated with sickle cell. Room air sat 88%. Placed on 3L oxygen 

## 2024-03-01 ENCOUNTER — Inpatient Hospital Stay (HOSPITAL_COMMUNITY)
Admit: 2024-03-01 | Discharge: 2024-03-01 | Disposition: A | Discharge status: Home / Self Care | Payer: Self-pay | Attending: Cardiology | Admitting: Cardiology

## 2024-03-01 NOTE — Progress Notes (Incomplete)
   ADVANCED HEART FAILURE NEW PATIENT CLINIC NOTE  Referring Physician: [name], [name], [name]  Primary Care: [name], [name], [name] Primary Cardiologist:  HPI: Martin [name] is a 33 y.o. male with a PMH of homozygous sickle cell disease who presents for initial visit for further evaluation and treatment of heart failure/cardiomyopathy.      {Anything typed between these two boxes will persist and can be pulled forward to future notes. This phrase will delete itself when the note is signed :1}      SUBJECTIVE:   PMH, current medications, allergies, social history, and family history reviewed in epic.  PHYSICAL EXAM: There were no vitals filed for this visit. GENERAL: Well nourished and in no apparent distress at rest.  PULM:  Normal work of breathing, clear to auscultation bilaterally. Respirations are unlabored.  CARDIAC:  JVP: ***         Normal rate with regular rhythm. No murmurs, rubs or gallops.  *** edema. Warm and well perfused extremities. ABDOMEN: Soft, non-tender, non-distended. NEUROLOGIC: Patient is oriented x3 with no focal or lateralizing neurologic deficits.    DATA REVIEW  ECG: ***    ECHO: ***  CATH: ***       ASSESSMENT & PLAN:  ***  Follow up in ***  Morene Brownie, MD Advanced Heart Failure Mechanical Circulatory Support 03/01/24

## 2024-03-03 ENCOUNTER — Other Ambulatory Visit: Payer: Self-pay

## 2024-03-03 ENCOUNTER — Other Ambulatory Visit: Payer: Self-pay | Admitting: Nurse Practitioner

## 2024-03-03 ENCOUNTER — Inpatient Hospital Stay (HOSPITAL_COMMUNITY)
Admission: EM | Admit: 2024-03-03 | Discharge: 2024-03-12 | DRG: 812 | Disposition: A | Discharge status: Home / Self Care | Attending: Internal Medicine | Admitting: Internal Medicine

## 2024-03-03 DIAGNOSIS — Z86711 Personal history of pulmonary embolism: Secondary | ICD-10-CM

## 2024-03-03 DIAGNOSIS — D571 Sickle-cell disease without crisis: Secondary | ICD-10-CM

## 2024-03-03 DIAGNOSIS — Z21 Asymptomatic human immunodeficiency virus [HIV] infection status: Secondary | ICD-10-CM | POA: Diagnosis present

## 2024-03-03 DIAGNOSIS — I272 Pulmonary hypertension, unspecified: Secondary | ICD-10-CM | POA: Diagnosis present

## 2024-03-03 DIAGNOSIS — B20 Human immunodeficiency virus [HIV] disease: Secondary | ICD-10-CM | POA: Diagnosis present

## 2024-03-03 DIAGNOSIS — D638 Anemia in other chronic diseases classified elsewhere: Secondary | ICD-10-CM | POA: Diagnosis present

## 2024-03-03 DIAGNOSIS — F1190 Opioid use, unspecified, uncomplicated: Secondary | ICD-10-CM

## 2024-03-03 DIAGNOSIS — Z79899 Other long term (current) drug therapy: Secondary | ICD-10-CM

## 2024-03-03 DIAGNOSIS — Z832 Family history of diseases of the blood and blood-forming organs and certain disorders involving the immune mechanism: Secondary | ICD-10-CM

## 2024-03-03 DIAGNOSIS — Z883 Allergy status to other anti-infective agents status: Secondary | ICD-10-CM

## 2024-03-03 DIAGNOSIS — I5032 Chronic diastolic (congestive) heart failure: Secondary | ICD-10-CM | POA: Diagnosis present

## 2024-03-03 DIAGNOSIS — Z9981 Dependence on supplemental oxygen: Secondary | ICD-10-CM

## 2024-03-03 DIAGNOSIS — D649 Anemia, unspecified: Secondary | ICD-10-CM | POA: Diagnosis present

## 2024-03-03 DIAGNOSIS — Z91048 Other nonmedicinal substance allergy status: Secondary | ICD-10-CM

## 2024-03-03 DIAGNOSIS — K219 Gastro-esophageal reflux disease without esophagitis: Secondary | ICD-10-CM | POA: Diagnosis present

## 2024-03-03 DIAGNOSIS — M25511 Pain in right shoulder: Secondary | ICD-10-CM | POA: Diagnosis not present

## 2024-03-03 DIAGNOSIS — K5900 Constipation, unspecified: Secondary | ICD-10-CM | POA: Diagnosis not present

## 2024-03-03 DIAGNOSIS — D72829 Elevated white blood cell count, unspecified: Secondary | ICD-10-CM | POA: Diagnosis present

## 2024-03-03 DIAGNOSIS — R6510 Systemic inflammatory response syndrome (SIRS) of non-infectious origin without acute organ dysfunction: Secondary | ICD-10-CM | POA: Diagnosis not present

## 2024-03-03 DIAGNOSIS — Z888 Allergy status to other drugs, medicaments and biological substances status: Secondary | ICD-10-CM

## 2024-03-03 DIAGNOSIS — G894 Chronic pain syndrome: Secondary | ICD-10-CM | POA: Diagnosis present

## 2024-03-03 DIAGNOSIS — J9611 Chronic respiratory failure with hypoxia: Secondary | ICD-10-CM | POA: Diagnosis present

## 2024-03-03 DIAGNOSIS — D5700 Hb-SS disease with crisis, unspecified: Principal | ICD-10-CM | POA: Diagnosis present

## 2024-03-03 DIAGNOSIS — F411 Generalized anxiety disorder: Secondary | ICD-10-CM | POA: Diagnosis present

## 2024-03-03 DIAGNOSIS — Z8616 Personal history of COVID-19: Secondary | ICD-10-CM

## 2024-03-03 MED ORDER — HYDROMORPHONE HCL 1 MG/ML IJ SOLN: 1.0000 mg | Freq: Once | INTRAMUSCULAR | Status: DC

## 2024-03-03 MED ORDER — OXYCODONE-ACETAMINOPHEN 10-325 MG PO TABS: 1.0000 | ORAL_TABLET | ORAL | 0 refills | Status: DC | PRN

## 2024-03-03 NOTE — Telephone Encounter (Signed)
 Reviewed PDMP substance reporting system prior to prescribing opiate medications. No inconsistencies noted.    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  Dispense: 90 tablet; Refill: 0  2. Chronic, continuous use of opioids  - oxyCODONE -acetaminophen  (PERCOCET) 10-325 MG tablet; Take 1 tablet by mouth every 4 (four) hours as needed for pain.  Dispense: 90 tablet; Refill: 0

## 2024-03-03 NOTE — Telephone Encounter (Signed)
 Please advise [facility]

## 2024-03-03 NOTE — Telephone Encounter (Signed)
 Copied from CRM #[id number]. Topic: Clinical - Medication Refill >> Mar 03, 2024 12:55 PM Lauren C wrote: Medication: oxyCODONE -acetaminophen  (PERCOCET) 10-325 MG tablet cetirizine  (ZYRTEC ) 10 MG tablet (last prescribed by historical provider, pt is wondering if paseda will continue with these refills)  Has the patient contacted their pharmacy? No (Agent: If no, request that the patient contact the pharmacy for the refill. If patient does not wish to contact the pharmacy document the reason why and proceed with request.) (Agent: If yes, when and what did the pharmacy advise?)  This is the patient's preferred pharmacy:  [id number] - [place], [place] - [address] AT [facility] [address] [place] [address] Phone: [phone] Fax: [phone]  [facility] [address] [place] [address] Phone: [phone] Fax: [phone]  Is this the correct pharmacy for this prescription? Yes If no, delete pharmacy and type the correct one.   Has the prescription been filled recently? Yes on oxycodone , no for allergy medication.  Is the patient out of the medication? No  Has the patient been seen for an appointment in the last year OR does the patient have an upcoming appointment? Yes  Can we respond through MyChart? Please call [phone]  Agent: Please be advised that Rx refills may take up to 3 business days. We ask that you follow-up with your pharmacy.

## 2024-03-03 NOTE — ED Triage Notes (Signed)
 Patient sickle cell pain started today. Patient report taking PRN pain medication with no relief. Patient denies Chest pain and SOB. Patient denies N/V.

## 2024-03-04 DIAGNOSIS — K219 Gastro-esophageal reflux disease without esophagitis: Secondary | ICD-10-CM

## 2024-03-04 DIAGNOSIS — D72829 Elevated white blood cell count, unspecified: Secondary | ICD-10-CM | POA: Diagnosis present

## 2024-03-04 DIAGNOSIS — D638 Anemia in other chronic diseases classified elsewhere: Secondary | ICD-10-CM | POA: Diagnosis present

## 2024-03-04 DIAGNOSIS — G894 Chronic pain syndrome: Secondary | ICD-10-CM | POA: Diagnosis present

## 2024-03-04 DIAGNOSIS — K5900 Constipation, unspecified: Secondary | ICD-10-CM | POA: Diagnosis not present

## 2024-03-04 DIAGNOSIS — D5700 Hb-SS disease with crisis, unspecified: Secondary | ICD-10-CM

## 2024-03-04 DIAGNOSIS — Z86711 Personal history of pulmonary embolism: Secondary | ICD-10-CM | POA: Diagnosis not present

## 2024-03-04 DIAGNOSIS — Z9981 Dependence on supplemental oxygen: Secondary | ICD-10-CM | POA: Diagnosis not present

## 2024-03-04 DIAGNOSIS — Z883 Allergy status to other anti-infective agents status: Secondary | ICD-10-CM | POA: Diagnosis not present

## 2024-03-04 DIAGNOSIS — Z79899 Other long term (current) drug therapy: Secondary | ICD-10-CM | POA: Diagnosis not present

## 2024-03-04 DIAGNOSIS — Z91048 Other nonmedicinal substance allergy status: Secondary | ICD-10-CM | POA: Diagnosis not present

## 2024-03-04 DIAGNOSIS — R6510 Systemic inflammatory response syndrome (SIRS) of non-infectious origin without acute organ dysfunction: Secondary | ICD-10-CM | POA: Diagnosis not present

## 2024-03-04 DIAGNOSIS — Z21 Asymptomatic human immunodeficiency virus [HIV] infection status: Secondary | ICD-10-CM

## 2024-03-04 DIAGNOSIS — Z888 Allergy status to other drugs, medicaments and biological substances status: Secondary | ICD-10-CM | POA: Diagnosis not present

## 2024-03-04 DIAGNOSIS — Z8616 Personal history of COVID-19: Secondary | ICD-10-CM | POA: Diagnosis not present

## 2024-03-04 DIAGNOSIS — I272 Pulmonary hypertension, unspecified: Secondary | ICD-10-CM | POA: Diagnosis present

## 2024-03-04 DIAGNOSIS — I5032 Chronic diastolic (congestive) heart failure: Secondary | ICD-10-CM | POA: Diagnosis present

## 2024-03-04 DIAGNOSIS — J9611 Chronic respiratory failure with hypoxia: Secondary | ICD-10-CM | POA: Diagnosis present

## 2024-03-04 DIAGNOSIS — F411 Generalized anxiety disorder: Secondary | ICD-10-CM | POA: Diagnosis present

## 2024-03-04 DIAGNOSIS — M25511 Pain in right shoulder: Secondary | ICD-10-CM | POA: Diagnosis not present

## 2024-03-04 DIAGNOSIS — B20 Human immunodeficiency virus [HIV] disease: Secondary | ICD-10-CM | POA: Diagnosis present

## 2024-03-04 DIAGNOSIS — Z832 Family history of diseases of the blood and blood-forming organs and certain disorders involving the immune mechanism: Secondary | ICD-10-CM | POA: Diagnosis not present

## 2024-03-04 LAB — COMPREHENSIVE METABOLIC PANEL WITH GFR
ALT: 18 U/L (ref 0–44)
AST: 30 U/L (ref 15–41)
Albumin: 3.6 g/dL (ref 3.5–5.0)
Alkaline Phosphatase: 64 U/L (ref 38–126)
Anion gap: 8 (ref 5–15)
BUN: <5 mg/dL — ABNORMAL LOW (ref 6–20)
CO2: 24 mmol/L (ref 22–32)
Calcium: 8.3 mg/dL — ABNORMAL LOW (ref 8.9–10.3)
Chloride: 111 mmol/L (ref 98–111)
Creatinine, Ser: 0.82 mg/dL (ref 0.61–1.24)
GFR, Estimated: >60 mL/min (ref >60)
Glucose, Bld: 96 mg/dL (ref 70–99)
Potassium: 3.8 mmol/L (ref 3.5–5.1)
Sodium: 143 mmol/L (ref 135–145)
Total Bilirubin: 5.8 mg/dL — ABNORMAL HIGH (ref 0.0–1.2)
Total Protein: 7.5 g/dL (ref 6.5–8.1)

## 2024-03-04 LAB — CBC WITH DIFFERENTIAL/PLATELET
Abs Immature Granulocytes: 0.03 K/uL (ref 0.00–0.07)
Basophils Absolute: 0 K/uL (ref 0.0–0.1)
Basophils Relative: 0 %
Eosinophils Absolute: 0.1 K/uL (ref 0.0–0.5)
Eosinophils Relative: 1 %
HCT: 22.5 % — ABNORMAL LOW (ref 39.0–52.0)
Hemoglobin: 8.1 g/dL — ABNORMAL LOW (ref 13.0–17.0)
Immature Granulocytes: 0 %
Lymphocytes Relative: 51 %
Lymphs Abs: 3.8 K/uL (ref 0.7–4.0)
MCH: 37.3 pg — ABNORMAL HIGH (ref 26.0–34.0)
MCHC: 36 g/dL (ref 30.0–36.0)
MCV: 103.7 fL — ABNORMAL HIGH (ref 80.0–100.0)
Monocytes Absolute: 1.2 K/uL — ABNORMAL HIGH (ref 0.1–1.0)
Monocytes Relative: 16 %
Neutro Abs: 2.4 K/uL (ref 1.7–7.7)
Neutrophils Relative %: 32 %
Platelets: 355 K/uL (ref 150–400)
RBC: 2.17 MIL/uL — ABNORMAL LOW (ref 4.22–5.81)
RDW: 29.2 % — ABNORMAL HIGH (ref 11.5–15.5)
WBC: 7.5 K/uL (ref 4.0–10.5)
nRBC: 51.9 % — ABNORMAL HIGH (ref 0.0–0.2)

## 2024-03-04 LAB — RETICULOCYTES
Immature Retic Fract: 30.3 % — ABNORMAL HIGH (ref 2.3–15.9)
RBC.: 2.26 MIL/uL — ABNORMAL LOW (ref 4.22–5.81)
Retic Count, Absolute: 500.4 K/uL — ABNORMAL HIGH (ref 19.0–186.0)

## 2024-03-04 MED ORDER — SODIUM CHLORIDE 0.9% FLUSH: 9.0000 mL | INTRAVENOUS | Status: DC | PRN

## 2024-03-04 MED ORDER — POLYETHYLENE GLYCOL 3350 17 G PO PACK
17.0000 g | PACK | Freq: Every day | ORAL | Status: DC | PRN
  Filled 2024-03-04: qty 1

## 2024-03-04 MED ORDER — SENNOSIDES-DOCUSATE SODIUM 8.6-50 MG PO TABS
2.0000 | ORAL_TABLET | Freq: Every day | ORAL | Status: DC
Start: 2024-03-04 — End: 2024-03-04

## 2024-03-04 MED ORDER — OXYCODONE HCL 5 MG PO TABS
10.0000 mg | ORAL_TABLET | Freq: Once | ORAL | Status: AC
  Administered 2024-03-04: 10 mg via ORAL
  Filled 2024-03-04: qty 2

## 2024-03-04 MED ORDER — HYDROXYUREA 500 MG PO CAPS
2000.0000 mg | ORAL_CAPSULE | Freq: Every day | ORAL | Status: DC
  Administered 2024-03-04 – 2024-03-11 (×8): 2000 mg via ORAL
  Filled 2024-03-04 (×8): qty 4

## 2024-03-04 MED ORDER — ONDANSETRON HCL 4 MG/2ML IJ SOLN
4.0000 mg | Freq: Four times a day (QID) | INTRAMUSCULAR | Status: DC | PRN
  Administered 2024-03-05 – 2024-03-12 (×7): 4 mg via INTRAVENOUS
  Filled 2024-03-04 (×7): qty 2

## 2024-03-04 MED ORDER — DIPHENHYDRAMINE HCL 25 MG PO CAPS
25.0000 mg | ORAL_CAPSULE | Freq: Once | ORAL | Status: DC
  Filled 2024-03-04: qty 1

## 2024-03-04 MED ORDER — ABACAVIR-DOLUTEGRAVIR-LAMIVUD 600-50-300 MG PO TABS
1.0000 | ORAL_TABLET | Freq: Every day | ORAL | Status: DC
  Administered 2024-03-04 – 2024-03-12 (×9): 1 via ORAL
  Filled 2024-03-04 (×10): qty 1

## 2024-03-04 MED ORDER — HYDROMORPHONE HCL 1 MG/ML IJ SOLN
2.0000 mg | INTRAMUSCULAR | Status: AC | PRN
Start: 2024-03-04 — End: 2024-03-06
  Administered 2024-03-04 – 2024-03-06 (×3): 2 mg via INTRAVENOUS
  Filled 2024-03-04 (×3): qty 2

## 2024-03-04 MED ORDER — DABIGATRAN ETEXILATE MESYLATE 150 MG PO CAPS
150.0000 mg | ORAL_CAPSULE | Freq: Two times a day (BID) | ORAL | Status: DC
  Administered 2024-03-04 – 2024-03-12 (×17): 150 mg via ORAL
  Filled 2024-03-04 (×18): qty 1

## 2024-03-04 MED ORDER — HYDROMORPHONE HCL 1 MG/ML IJ SOLN
2.0000 mg | INTRAMUSCULAR | Status: AC
  Administered 2024-03-04: 2 mg via INTRAVENOUS
  Filled 2024-03-04: qty 2

## 2024-03-04 MED ORDER — NALOXONE HCL 0.4 MG/ML IJ SOLN: 0.4000 mg | INTRAMUSCULAR | Status: DC | PRN

## 2024-03-04 MED ORDER — GABAPENTIN 300 MG PO CAPS: 300.0000 mg | ORAL_CAPSULE | Freq: Every evening | ORAL | Status: DC | PRN

## 2024-03-04 MED ORDER — SODIUM CHLORIDE 0.9 % IV BOLUS
500.0000 mL | Freq: Once | INTRAVENOUS | Status: AC
  Administered 2024-03-04: 500 mL via INTRAVENOUS

## 2024-03-04 MED ORDER — HYDROMORPHONE HCL 1 MG/ML IJ SOLN: 2.0000 mg | INTRAMUSCULAR | Status: DC

## 2024-03-04 MED ORDER — CYCLOBENZAPRINE HCL 10 MG PO TABS: 5.0000 mg | ORAL_TABLET | Freq: Two times a day (BID) | ORAL | Status: DC | PRN

## 2024-03-04 MED ORDER — HYDROMORPHONE 1 MG/ML IV SOLN
INTRAVENOUS | Status: DC
  Administered 2024-03-04: 30 mg via INTRAVENOUS
  Administered 2024-03-04: 3.5 mg via INTRAVENOUS
  Administered 2024-03-04: 5 mg via INTRAVENOUS
  Administered 2024-03-04: 6 mg via INTRAVENOUS
  Administered 2024-03-05: 1.5 mg via INTRAVENOUS
  Administered 2024-03-05: 7.5 mg via INTRAVENOUS
  Administered 2024-03-05: 5.5 mg via INTRAVENOUS
  Administered 2024-03-05: 3.5 mg via INTRAVENOUS
  Administered 2024-03-05: 30 mg via INTRAVENOUS
  Administered 2024-03-06: 2.5 mg via INTRAVENOUS
  Administered 2024-03-06: 9 mg via INTRAVENOUS
  Administered 2024-03-06: 30 mg via INTRAVENOUS
  Administered 2024-03-06: 4.5 mg via INTRAVENOUS
  Administered 2024-03-06: 1.5 mg via INTRAVENOUS
  Administered 2024-03-06: 6 mg via INTRAVENOUS
  Administered 2024-03-07: 30 mg via INTRAVENOUS
  Administered 2024-03-07: 4 mg via INTRAVENOUS
  Administered 2024-03-07: 7 mg via INTRAVENOUS
  Administered 2024-03-07: 2.5 mg via INTRAVENOUS
  Administered 2024-03-08: 1.5 mg via INTRAVENOUS
  Administered 2024-03-08: 3 mg via INTRAVENOUS
  Administered 2024-03-08: 6.5 mg via INTRAVENOUS
  Administered 2024-03-08: 5 mg via INTRAVENOUS
  Administered 2024-03-08: 4 mg via INTRAVENOUS
  Administered 2024-03-08: 3.5 mg via INTRAVENOUS
  Administered 2024-03-08: 3 mg via INTRAVENOUS
  Administered 2024-03-09: 6 mg via INTRAVENOUS
  Administered 2024-03-09: 3.5 mg via INTRAVENOUS
  Administered 2024-03-09: 6 mg via INTRAVENOUS
  Administered 2024-03-09: 2 mg via INTRAVENOUS
  Administered 2024-03-09: 30 mg via INTRAVENOUS
  Administered 2024-03-09: 4.5 mg via INTRAVENOUS
  Administered 2024-03-09: 3.5 mg via INTRAVENOUS
  Administered 2024-03-10: 6 mg via INTRAVENOUS
  Administered 2024-03-10: 10 mg via INTRAVENOUS
  Administered 2024-03-10: 7 mg via INTRAVENOUS
  Administered 2024-03-10: 30 mg via INTRAVENOUS
  Administered 2024-03-10: 2.5 mg via INTRAVENOUS
  Administered 2024-03-11: 7.5 mg via INTRAVENOUS
  Administered 2024-03-11: 11 mg via INTRAVENOUS
  Administered 2024-03-11: 8 mg via INTRAVENOUS
  Administered 2024-03-11: 30 mg via INTRAVENOUS
  Administered 2024-03-11: 10 mg via INTRAVENOUS
  Administered 2024-03-12: 10.5 mg via INTRAVENOUS
  Administered 2024-03-12: 5.5 mg via INTRAVENOUS
  Administered 2024-03-12: 9.5 mg via INTRAVENOUS
  Administered 2024-03-12: 30 mg via INTRAVENOUS
  Filled 2024-03-04 (×9): qty 30

## 2024-03-04 MED ORDER — SENNOSIDES-DOCUSATE SODIUM 8.6-50 MG PO TABS
1.0000 | ORAL_TABLET | Freq: Two times a day (BID) | ORAL | Status: DC
  Administered 2024-03-04 – 2024-03-12 (×10): 1 via ORAL
  Filled 2024-03-04 (×17): qty 1

## 2024-03-04 MED ORDER — HYDROMORPHONE HCL 1 MG/ML IJ SOLN
2.0000 mg | Freq: Once | INTRAMUSCULAR | Status: AC
  Administered 2024-03-04: 2 mg via INTRAVENOUS
  Filled 2024-03-04: qty 2

## 2024-03-04 MED ORDER — FOLIC ACID 1 MG PO TABS
500.0000 ug | ORAL_TABLET | Freq: Every day | ORAL | Status: DC
  Administered 2024-03-04 – 2024-03-12 (×9): 0.5 mg via ORAL
  Filled 2024-03-04 (×10): qty 1

## 2024-03-04 MED ORDER — DEXTROSE-SODIUM CHLORIDE 5-0.45 % IV SOLN: INTRAVENOUS | Status: AC

## 2024-03-04 MED ORDER — DIPHENHYDRAMINE HCL 25 MG PO CAPS
25.0000 mg | ORAL_CAPSULE | ORAL | Status: DC | PRN
  Administered 2024-03-07 – 2024-03-10 (×3): 25 mg via ORAL
  Filled 2024-03-04 (×3): qty 1

## 2024-03-04 NOTE — ED Provider Notes (Signed)
 [place] EMERGENCY DEPARTMENT AT [facility] Provider Note  CSN: [id number] Arrival date & time: 03/03/24 2255  Chief Complaint(s) Sickle Cell Pain Crisis  HPI Martin [name] is a 33 y.o. male with a past medical history listed below who presents to the emergency department with lower back pain and hip pain consistent with prior pain related to sickle cell.  Ongoing for several days.  Home medication not helping.  He denies any falls.  No fevers or chills.  No other physical complaints including chest pain or shortness of breath.  The history is provided by the patient.    Past Medical History Past Medical History:  Diagnosis Date   Anxiety    Chest pain 02/04/2024   HIV (human immunodeficiency virus infection) (HCC)    Proteinuria    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HCC)    Vitamin D  deficiency 10/2018   Patient Active Problem List   Diagnosis Date Noted   Sickle cell pain crisis (HCC) 02/08/2024   Right flank pain 02/04/2024   Elevated brain natriuretic peptide (BNP) level 02/04/2024   Chest pain 02/04/2024   Acute cystitis with hematuria 02/04/2024   Muscle spasm 02/04/2024   Decrease in appetite 02/04/2024   Acute on chronic hypoxic respiratory failure (HCC) 01/16/2024   Chronic anticoagulation 12/18/2023   Sickle cell crisis (HCC) 10/12/2023   Chronic hypoxic respiratory failure (HCC) 08/04/2023   Iron overload, 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   Influenza A with pneumonia 06/20/2023   Acute hypoxemic respiratory failure (HCC) 06/20/2023   AKI (acute kidney injury) 06/20/2023   Influenza A with respiratory manifestations 06/20/2023   Acute on chronic anemia 03/16/2023   History of HIV infection (HCC) 03/16/2023   Hypokalemia 03/16/2023   Anemia of chronic disease 07/13/2022   Hyperbilirubinemia 07/13/2022   Tinea capitis 07/29/2021   Bacteremia due to Enterococcus 08/29/2020   Acute chest syndrome (HCC) 08/26/2020   Hypoxia 07/09/2020   COVID-19 05/13/2020    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/18/2020   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   Abnormal penile discharge, without blood 02/22/2020   Leukocytosis 01/02/2020   Anxiety 11/27/2019   Proteinuria 11/27/2019   Acute on chronic respiratory failure with hypoxia (HCC) 11/16/2019   Chronic, continuous use of opioids 08/15/2019   Seasonal allergies 08/15/2019   Chest congestion    Chronic pain syndrome 08/04/2019   History of pulmonary embolism 08/04/2019   Acute pulmonary embolism (HCC) 02/10/2019   Acute chest syndrome due to sickle cell crisis (HCC) 02/10/2019   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) 03/09/2018   Bone pain 03/07/2018   Hip pain 03/07/2018   Acute bronchitis due to Streptococcus 02/21/2018   Heart murmur 02/03/2017   Sickle cell disease with crisis (HCC) 02/02/2017   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 Authorizing Provider  abacavir -dolutegravir -lamiVUDine  (TRIUMEQ ) 600-50-300 MG tablet Take 1 tablet by mouth daily. 08/13/23   Pearlean Manus, MD  albuterol  (VENTOLIN  HFA) 108 (90 Base) MCG/ACT inhaler Inhale 2 puffs into the lungs every 6 (six) hours as needed for wheezing or shortness of breath. 08/13/23   Pearlean Manus, MD  Cholecalciferol  (VITAMIN D3) 125 MCG (  5000 UT) CAPS Take 5,000 Units by mouth every Tuesday.    [provider]  cyclobenzaprine  (FLEXERIL ) 5 MG tablet Take 1 tablet (5 mg total) by mouth 2 (two) times daily as needed for muscle spasms. 02/04/24   [name], [name], [name]  dabigatran (PRADAXA) 150 MG CAPS capsule Take 1 capsule  (150 mg total) by mouth 2 (two) times daily. 02/09/24   Jegede, Olugbemiga E, MD  dabigatran (PRADAXA) 150 MG CAPS capsule Take 1 capsule (150 mg total) by mouth every 12 (twelve) hours. Patient not taking: Reported on 02/18/2024 02/16/24   Cherylene [name] HERO, NP  Deferasirox  (JADENU ) 360 MG TABS Take 2 tablets (720 mg total) by mouth daily. Patient not taking: Reported on 02/18/2024 11/05/23   [name], [name], [name]  diphenhydrAMINE  (BENADRYL ) 25 MG tablet Take 1 tablet (25 mg total) by mouth every 4 (four) hours as needed for itching. 08/13/23   Pearlean Manus, MD  folic acid  (FOLVITE ) 400 MCG tablet Take 1 tablet (400 mcg total) by mouth daily. 08/13/23   Pearlean Manus, MD  gabapentin  (NEURONTIN ) 300 MG capsule Take 1 capsule (300 mg total) by mouth at bedtime as needed (nerve pain). 08/13/23   Pearlean Manus, MD  guaiFENesin  (MUCINEX ) 600 MG 12 hr tablet Take 1 tablet (600 mg total) by mouth 2 (two) times daily. Patient not taking: Reported on 02/18/2024 08/13/23 08/12/24  Pearlean Manus, MD  hydroxyurea  (HYDREA ) 500 MG capsule Take 4 capsules (2,000 mg total) by mouth at bedtime. 08/13/23   Pearlean Manus, MD  NARCAN  4 MG/0.1ML LIQD nasal spray kit Place 1 spray into the nose as needed (respiratory distress / opioid overdose). 01/07/23   [provider]  oxyCODONE -acetaminophen  (PERCOCET) 10-325 MG tablet Take 1 tablet by mouth every 4 (four) hours as needed for pain. 03/05/24   [name], [name], [name]  pantoprazole  (PROTONIX ) 40 MG tablet Take 1 tablet (40 mg total) by mouth daily as needed (acid reflux). Patient not taking: Reported on 02/18/2024 08/13/23   Pearlean Manus, MD  senna-docusate (SENOKOT-S) 8.6-50 MG tablet Take 2 tablets by mouth at bedtime. 08/13/23   Pearlean Manus, MD  sulfamethoxazole-trimethoprim (BACTRIM DS) 800-160 MG tablet Take 1 tablet by mouth 2 (two) times daily. Patient not taking: Reported on 02/18/2024 02/04/24   [name], [name], [name]   triamcinolone  cream (KENALOG ) 0.1 % Apply 1 Application topically 2 (two) times daily. Patient not taking: Reported on 02/18/2024 01/12/23   Tilford Bertram HERO, [name]                                                                                                                                    Allergies Ketorolac  tromethamine , Tape, Wound dressing adhesive, and Bactoshield chg [chlorhexidine  gluconate]  Review of Systems Review of Systems As noted in HPI  Physical Exam Vital Signs  I have reviewed the triage vital signs BP 107/67   Pulse 87   Temp 98.4  F (36.9 C) (Oral)   Resp 19   SpO2 99%   Physical Exam Vitals reviewed.  Constitutional:      General: He is not in acute distress.    Appearance: He is well-developed. He is not diaphoretic.  HENT:     Head: Normocephalic and atraumatic.     Right Ear: External ear normal.     Left Ear: External ear normal.     Nose: Nose normal.     Mouth/Throat:     Mouth: Mucous membranes are moist.  Eyes:     General: No scleral icterus.    Conjunctiva/sclera: Conjunctivae normal.  Neck:     Trachea: Phonation normal.  Cardiovascular:     Rate and Rhythm: Normal rate and regular rhythm.  Pulmonary:     Effort: Pulmonary effort is normal. No respiratory distress.     Breath sounds: No stridor.  Abdominal:     General: There is no distension.  Musculoskeletal:        General: Normal range of motion.     Cervical back: Normal range of motion.  Neurological:     Mental Status: He is alert and oriented to person, place, and time.  Psychiatric:        Behavior: Behavior normal.     ED Results and Treatments Labs (all labs ordered are listed, but only abnormal results are displayed) Labs Reviewed  COMPREHENSIVE METABOLIC PANEL WITH GFR - Abnormal; Notable for the following components:      Result Value   BUN <5 (*)    Calcium  8.3 (*)    Total Bilirubin 5.8 (*)    All other components within normal limits  CBC WITH  DIFFERENTIAL/PLATELET - Abnormal; Notable for the following components:   RBC 2.17 (*)    Hemoglobin 8.1 (*)    HCT 22.5 (*)    MCV 103.7 (*)    MCH 37.3 (*)    RDW 29.2 (*)    nRBC 51.9 (*)    Monocytes Absolute 1.2 (*)    All other components within normal limits  RETICULOCYTES - Abnormal; Notable for the following components:   RBC. 2.26 (*)    Retic Count, Absolute 500.4 (*)    Immature Retic Fract 30.3 (*)    All other components within normal limits                                                                                                                         EKG  EKG Interpretation Date/Time:  Friday March 03 2024 23:16:18 EST Ventricular Rate:  89 PR Interval:  175 QRS Duration:  87 QT Interval:  382 QTC Calculation: 465 R Axis:   119  Text Interpretation: Sinus rhythm No significant change since last tracing Confirmed by Trine Likes [phone]) on 03/03/2024 11:22:04 PM       Radiology No results found.  Medications Ordered in ED Medications  diphenhydrAMINE  (BENADRYL ) capsule 25 mg (0 mg Oral Hold 03/04/24 0038)  HYDROmorphone  (DILAUDID ) injection 2 mg (2 mg Intravenous Given 03/04/24 0032)  sodium chloride  0.9 % bolus 500 mL (0 mLs Intravenous Stopped 03/04/24 0239)  HYDROmorphone  (DILAUDID ) injection 2 mg (2 mg Intravenous Given 03/04/24 0127)  oxyCODONE  (Oxy IR/ROXICODONE ) immediate release tablet 10 mg (10 mg Oral Given 03/04/24 0233)  HYDROmorphone  (DILAUDID ) injection 2 mg (2 mg Intravenous Given 03/04/24 0339)   Procedures .Critical Care  Performed by: Trine Raynell Moder, MD Authorized by: Trine Raynell Moder, MD   Critical care provider statement:    Critical care time (minutes):  30   Critical care was necessary to treat or prevent imminent or life-threatening deterioration of the following conditions: sickle cell pain crisis needing admission for pain control.   Critical care was time spent personally by me on the following  activities:  Development of treatment plan with patient or surrogate, discussions with consultants, evaluation of patient's response to treatment, examination of patient, ordering and review of laboratory studies, ordering and review of radiographic studies, ordering and performing treatments and interventions, pulse oximetry, re-evaluation of patient's condition and review of old charts   Care discussed with: admitting provider     (including critical care time) Medical Decision Making / ED Course   Medical Decision Making Amount and/or Complexity of Data Reviewed Labs: ordered. Decision-making details documented in ED Course.  Risk Prescription drug management. Decision regarding hospitalization.    Pain related to sickle cell.  No acute distress. Labs reassuring with stable hemoglobin.  No signs of hemolysis. Patient given IV fluids and several rounds of IV pain medicine as well as oral medicine.  Pain not controlled.  Will admit for pain control.  Spoke with Dr. Alfornia who will have morning team evaluate    Final Clinical Impression(s) / ED Diagnoses Final diagnoses:  Sickle cell anemia with pain [facility])    This chart was dictated using voice recognition software.  Despite best efforts to proofread,  errors can occur which can change the documentation meaning.    Trine Raynell Moder, MD 03/04/24 [phone]

## 2024-03-04 NOTE — H&P (Signed)
 History and Physical    Patient: Martin [name] DOB: August 17, 1990 DOA: 03/03/2024 DOS: the patient was seen and examined on 03/04/2024 PCP: [name], [name], [name]  Patient coming from: Home  Chief Complaint:  Chief Complaint  Patient presents with   Sickle Cell Pain Crisis   HPI: Martin [name] is a 33 y.o. male with medical history significant of sickle cell disease, HIV disease, chronic heart failure diastolic in nature, anxiety disorder, anemia of chronic disease, chronic pain syndrome, who presented to the ER today with complaint of pain consistent with his typical sickle cell crisis.  Pain is in the lower back.  Also his hips bilaterally.  His symptoms have been going on for several days.  He tried all his home medications but no relief.  Was seen in the ER and evaluated.  Patient was also seen at the day hospital 4 days ago and treated for same.  In the ER he received up to 6 mg of IV Dilaudid  pushes but no relief.  Patient being admitted to the medical service for treatment of his sickle cell crisis.  Review of Systems: As mentioned in the history of present illness. All other systems reviewed and are negative. Past Medical History:  Diagnosis Date   Anxiety    Chest pain 02/04/2024   HIV (human immunodeficiency virus infection) (HCC)    Proteinuria    Sickle cell disease (HCC)    Vitamin D  deficiency 10/2018   Past Surgical History:  Procedure Laterality Date   IR IMAGING GUIDED PORT INSERTION  08/29/2019   IR REMOVAL TUN ACCESS W/ PORT W/O FL MOD SED  08/30/2020   TEE WITHOUT CARDIOVERSION N/A 08/30/2020   Procedure: TRANSESOPHAGEAL ECHOCARDIOGRAM (TEE);  Surgeon: Mona Vinie BROCKS, MD;  Location: [facility] END[facility];  Service: Cardiovascular;  Laterality: N/A;   Social History:  reports that he has never smoked. He has never used smokeless tobacco. He reports that he does not currently use drugs after having used the following drugs: Marijuana. He reports that  he does not drink alcohol .  Allergies  Allergen Reactions   Ketorolac  Tromethamine  Swelling and Other (See Comments)    Patient reports facial edema and left arm edema after administration.   Tape Rash and Other (See Comments)    PLEASE DO NOT USE THE CLEAR, THICK, PLASTIC TAPE- only paper tape is tolerated    Wound Dressing Adhesive Rash   Bactoshield Chg [Chlorhexidine  Gluconate] Other (See Comments)    Per patient it makes me breakout    Family History  Problem Relation Age of Onset   Sickle cell trait Mother    Sickle cell trait Father    Birth defects Maternal Grandmother    Birth defects Paternal Grandmother     Prior to Admission medications   Medication Sig Start Date End Date Taking? Authorizing Provider  abacavir -dolutegravir -lamiVUDine  (TRIUMEQ ) 600-50-300 MG tablet Take 1 tablet by mouth daily. 08/13/23   Pearlean Manus, MD  albuterol  (VENTOLIN  HFA) 108 (90 Base) MCG/ACT inhaler Inhale 2 puffs into the lungs every 6 (six) hours as needed for wheezing or shortness of breath. 08/13/23   Pearlean Manus, MD  Cholecalciferol  (VITAMIN D3) 125 MCG (5000 UT) CAPS Take 5,000 Units by mouth every Tuesday.    [provider]  cyclobenzaprine  (FLEXERIL ) 5 MG tablet Take 1 tablet (5 mg total) by mouth 2 (two) times daily as needed for muscle spasms. 02/04/24   [name], [name], [name]  dabigatran (PRADAXA) 150 MG CAPS capsule Take 1  capsule (150 mg total) by mouth 2 (two) times daily. 02/09/24   Jegede, Olugbemiga E, MD  dabigatran (PRADAXA) 150 MG CAPS capsule Take 1 capsule (150 mg total) by mouth every 12 (twelve) hours. Patient not taking: Reported on 02/18/2024 02/16/24   Cherylene [name] HERO, NP  Deferasirox  (JADENU ) 360 MG TABS Take 2 tablets (720 mg total) by mouth daily. Patient not taking: Reported on 02/18/2024 11/05/23   [name], [name], [name]  diphenhydrAMINE  (BENADRYL ) 25 MG tablet Take 1 tablet (25 mg total) by mouth every 4 (four) hours as needed for  itching. 08/13/23   Pearlean Manus, MD  folic acid  (FOLVITE ) 400 MCG tablet Take 1 tablet (400 mcg total) by mouth daily. 08/13/23   Pearlean Manus, MD  gabapentin  (NEURONTIN ) 300 MG capsule Take 1 capsule (300 mg total) by mouth at bedtime as needed (nerve pain). 08/13/23   Pearlean Manus, MD  guaiFENesin  (MUCINEX ) 600 MG 12 hr tablet Take 1 tablet (600 mg total) by mouth 2 (two) times daily. Patient not taking: Reported on 02/18/2024 08/13/23 08/12/24  Pearlean Manus, MD  hydroxyurea  (HYDREA ) 500 MG capsule Take 4 capsules (2,000 mg total) by mouth at bedtime. 08/13/23   Pearlean Manus, MD  NARCAN  4 MG/0.1ML LIQD nasal spray kit Place 1 spray into the nose as needed (respiratory distress / opioid overdose). 01/07/23   [provider]  oxyCODONE -acetaminophen  (PERCOCET) 10-325 MG tablet Take 1 tablet by mouth every 4 (four) hours as needed for pain. 03/05/24   [name], [name], [name]  pantoprazole  (PROTONIX ) 40 MG tablet Take 1 tablet (40 mg total) by mouth daily as needed (acid reflux). Patient not taking: Reported on 02/18/2024 08/13/23   Pearlean Manus, MD  senna-docusate (SENOKOT-S) 8.6-50 MG tablet Take 2 tablets by mouth at bedtime. 08/13/23   Pearlean Manus, MD  sulfamethoxazole-trimethoprim (BACTRIM DS) 800-160 MG tablet Take 1 tablet by mouth 2 (two) times daily. Patient not taking: Reported on 02/18/2024 02/04/24   [name], [name], [name]  triamcinolone  cream (KENALOG ) 0.1 % Apply 1 Application topically 2 (two) times daily. Patient not taking: Reported on 02/18/2024 01/12/23   Tilford Bertram HERO, [name]    Physical Exam: Vitals:   03/04/24 0552 03/04/24 0700 03/04/24 0715 03/04/24 0723  BP:  99/73    Pulse: (!) 101 84 85   Resp: 19 13 13    Temp:    98.8 F (37.1 C)  TempSrc:    Oral  SpO2:  96% 92%   Weight:    63.5 kg   Constitutional: Chronically ill looking in mild distress due to pain  Eyes: PERRL, lids and conjunctivae normal ENMT: Mucous membranes are  moist. Posterior pharynx clear of any exudate or lesions.Normal dentition.  Neck: normal, supple, no masses, no thyromegaly Respiratory: clear to auscultation bilaterally, no wheezing, no crackles. Normal respiratory effort. No accessory muscle use.  Cardiovascular: Sinus tachycardia, no murmurs / rubs / gallops. No extremity edema. 2+ pedal pulses. No carotid bruits.  Abdomen: no tenderness, no masses palpated. No hepatosplenomegaly. Bowel sounds positive.  Musculoskeletal: Good range of motion, no joint swelling or tenderness, Skin: no rashes, lesions, ulcers. No induration Neurologic: CN 2-12 grossly intact. Sensation intact, DTR normal. Strength 5/5 in all 4.  Psychiatric: Normal judgment and insight. Alert and oriented x 3. Normal mood  Data Reviewed:  Temperature 97.5, blood pressure 99/73, pulse 103 respiratory 28, oxygen  sat 89% on room air calcium  is 8.3.  Hemoglobin 8.1 with white count 7.5.  Platelets 355.  Reticulocyte count absolute  is 500.  Assessment and Plan:  #1 sickle cell pain crisis: Patient will be admitted for pain management.  He has acute crisis.  Patient's pain was not relieved by home regimen.  Start Dilaudid  PCA, Toradol , and IV fluids.  D5 half-normal at 125 cc an hour.  Follow closely.  Patient has history of CHF so will be careful with fluids.  #2 anemia of chronic disease: H&H appears to be at baseline.  Continue to monitor  #3 chronic pain syndrome: Continue long-acting pain medication  #4 HIV disease: Continue home regimen  #5 diastolic CHF: Being followed by cardiology.  Compensated at this point  #6 GERD: Continue PPIs  #7 generalized anxiety disorder: Continue home regimen    Advance Care Planning:   Code Status: Full Code   Consults: None  Family Communication: No family at bedside  Severity of Illness: The appropriate patient status for this patient is INPATIENT. Inpatient status is judged to be reasonable and necessary in order to provide  the required intensity of service to ensure the patient's safety. The patient's presenting symptoms, physical exam findings, and initial radiographic and laboratory data in the context of their chronic comorbidities is felt to place them at high risk for further clinical deterioration. Furthermore, it is not anticipated that the patient will be medically stable for discharge from the hospital within 2 midnights of admission.   * I certify that at the point of admission it is my clinical judgment that the patient will require inpatient hospital care spanning beyond 2 midnights from the point of admission due to high intensity of service, high risk for further deterioration and high frequency of surveillance required.*  AuthorBETHA SIM KNOLL, MD 03/04/2024 8:25 AM  For on call review [email or web address].

## 2024-03-05 ENCOUNTER — Inpatient Hospital Stay (HOSPITAL_COMMUNITY)

## 2024-03-05 ENCOUNTER — Encounter (HOSPITAL_COMMUNITY): Payer: Self-pay | Admitting: Internal Medicine

## 2024-03-05 DIAGNOSIS — D5700 Hb-SS disease with crisis, unspecified: Secondary | ICD-10-CM | POA: Diagnosis not present

## 2024-03-05 DIAGNOSIS — Z21 Asymptomatic human immunodeficiency virus [HIV] infection status: Secondary | ICD-10-CM | POA: Diagnosis not present

## 2024-03-05 DIAGNOSIS — K219 Gastro-esophageal reflux disease without esophagitis: Secondary | ICD-10-CM | POA: Diagnosis not present

## 2024-03-05 DIAGNOSIS — F411 Generalized anxiety disorder: Secondary | ICD-10-CM | POA: Diagnosis not present

## 2024-03-05 LAB — URINALYSIS, COMPLETE (UACMP) WITH MICROSCOPIC
Bacteria, UA: NONE SEEN
Bilirubin Urine: NEGATIVE
Glucose, UA: NEGATIVE mg/dL
Hgb urine dipstick: NEGATIVE
Ketones, ur: NEGATIVE mg/dL
Leukocytes,Ua: NEGATIVE
Nitrite: NEGATIVE
Protein, ur: NEGATIVE mg/dL
Specific Gravity, Urine: 1.011 (ref 1.005–1.030)
pH: 5 (ref 5.0–8.0)

## 2024-03-05 LAB — COMPREHENSIVE METABOLIC PANEL WITH GFR
ALT: 21 U/L (ref 0–44)
AST: 48 U/L — ABNORMAL HIGH (ref 15–41)
Albumin: 3.4 g/dL — ABNORMAL LOW (ref 3.5–5.0)
Alkaline Phosphatase: 64 U/L (ref 38–126)
Anion gap: 7 (ref 5–15)
BUN: 6 mg/dL (ref 6–20)
CO2: 22 mmol/L (ref 22–32)
Calcium: 7.7 mg/dL — ABNORMAL LOW (ref 8.9–10.3)
Chloride: 106 mmol/L (ref 98–111)
Creatinine, Ser: 0.92 mg/dL (ref 0.61–1.24)
GFR, Estimated: >60 mL/min (ref >60)
Glucose, Bld: 96 mg/dL (ref 70–99)
Potassium: 4.2 mmol/L (ref 3.5–5.1)
Sodium: 135 mmol/L (ref 135–145)
Total Bilirubin: 7.5 mg/dL — ABNORMAL HIGH (ref 0.0–1.2)
Total Protein: 6.9 g/dL (ref 6.5–8.1)

## 2024-03-05 LAB — CBC WITH DIFFERENTIAL/PLATELET
Abs Immature Granulocytes: 0.09 K/uL — ABNORMAL HIGH (ref 0.00–0.07)
Basophils Absolute: 0 K/uL (ref 0.0–0.1)
Basophils Relative: 0 %
Eosinophils Absolute: 0.2 K/uL (ref 0.0–0.5)
Eosinophils Relative: 2 %
HCT: 19.8 % — ABNORMAL LOW (ref 39.0–52.0)
Hemoglobin: 7.1 g/dL — ABNORMAL LOW (ref 13.0–17.0)
Immature Granulocytes: 1 %
Lymphocytes Relative: 41 %
Lymphs Abs: 3.5 K/uL (ref 0.7–4.0)
MCH: 35.5 pg — ABNORMAL HIGH (ref 26.0–34.0)
MCHC: 35.9 g/dL (ref 30.0–36.0)
MCV: 99 fL (ref 80.0–100.0)
Monocytes Absolute: 1 K/uL (ref 0.1–1.0)
Monocytes Relative: 12 %
Neutro Abs: 3.7 K/uL (ref 1.7–7.7)
Neutrophils Relative %: 44 %
Platelets: 288 K/uL (ref 150–400)
RBC: 2 MIL/uL — ABNORMAL LOW (ref 4.22–5.81)
RDW: 26.9 % — ABNORMAL HIGH (ref 11.5–15.5)
WBC: 8.5 K/uL (ref 4.0–10.5)
nRBC: 35.3 % — ABNORMAL HIGH (ref 0.0–0.2)

## 2024-03-05 LAB — LACTIC ACID, PLASMA: Lactic Acid, Venous: <0.3 mmol/L — ABNORMAL LOW (ref 0.5–1.9)

## 2024-03-05 MED ORDER — ACETAMINOPHEN 325 MG PO TABS
650.0000 mg | ORAL_TABLET | Freq: Once | ORAL | Status: AC
  Administered 2024-03-05: 650 mg via ORAL
  Filled 2024-03-05: qty 2

## 2024-03-05 MED ORDER — BISACODYL 10 MG RE SUPP
10.0000 mg | Freq: Once | RECTAL | Status: AC
  Administered 2024-03-05: 10 mg via RECTAL
  Filled 2024-03-05: qty 1

## 2024-03-05 MED ORDER — CARMEX CLASSIC LIP BALM EX OINT
1.0000 | TOPICAL_OINTMENT | CUTANEOUS | Status: DC | PRN
  Administered 2024-03-05: 1 via TOPICAL
  Filled 2024-03-05: qty 10

## 2024-03-05 NOTE — Progress Notes (Addendum)
 MEWS Progress Note  Patient Details Name: Martin [name] MRN: [id number] DOB: 24-May-1990 Today's Date: 03/05/2024   MEWS Flowsheet Documentation:  Assess: MEWS Score Temp: 99.1 F (37.3 C) BP: 93/64 MAP (mmHg): 74 Pulse Rate: (!) 114 ECG Heart Rate: 80 Resp: 20 Level of Consciousness: Alert SpO2: 91 % O2 Device: Nasal Cannula O2 Flow Rate (L/min): 5 L/min FiO2 (%):  (unknown) Assess: MEWS Score MEWS Temp: 0 MEWS Systolic: 1 MEWS Pulse: 2 MEWS RR: 0 MEWS LOC: 0 MEWS Score: 3 MEWS Score Color: Yellow Assess: SIRS CRITERIA SIRS Temperature : 0 SIRS Respirations : 0 SIRS Pulse: 1 SIRS WBC: 0 SIRS Score Sum : 1 SIRS Temperature : 0 SIRS Pulse: 1 SIRS Respirations : 0 SIRS WBC: 0 SIRS Score Sum : 1   MD Sim, Charge Nurse Ava notified and Sepsis Work up ordered. 1000 Pt refusing blood cultures and MD notified. Patient educated on importance of blood cultures d/t signs and symptoms of infections and pt refused. Lactic acid and urine sample sent. Xray at the bedside now.     Michayla Mcneil Alondra  Mozqueda Jaramillo 03/05/2024, 9:46 AM

## 2024-03-05 NOTE — Plan of Care (Signed)

## 2024-03-05 NOTE — Progress Notes (Signed)
 SICKLE CELL SERVICE PROGRESS NOTE  [name] FMW:[id number] DOB: 08/17/1990 DOA: 03/03/2024 PCP: [name], [name], [name]  Assessment/Plan: Active Problems:   Acute on chronic anemia   HIV (human immunodeficiency virus infection) (HCC)   History of pulmonary embolism   Generalized anxiety disorder   GERD (gastroesophageal reflux disease)   Sickle cell pain crisis (HCC)  Sickle cell pain crisis: On Dilaudid  PCA, Toradol  and IV fluids.  Patient is slowly improving when constipated but has had a worsening symptom today with fever leukocytosis and increased MEWS.  Continue monitoring patient's H&H Anemia of chronic disease: Monitor H&H.  No transfusion required at this point.  Hemoglobin is at 7.1. SIRS: Patient had low temperature, tachycardia, will check urinalysis.  Chest x-ray, blood cultures. Chronic pain syndrome: Continue chronic pain medications. HIV disease: Continue home regimen GERD: Continue PPIs History of PE: Continue monitoring.  We may get CTA if symptoms continue Generalized anxiety disorder: Continue other supportive care  Code Status: Full code Family Communication: No family at bedside Disposition Plan: Not ready for discharge  [facility]  Pager [phone]. If 7PM-7AM, please contact night-coverage.  03/05/2024, 9:45 AM  LOS: 1 day   Brief narrative: Martin [name] is a 33 y.o. male with medical history significant of sickle cell disease, HIV disease, chronic heart failure diastolic in nature, anxiety disorder, anemia of chronic disease, chronic pain syndrome, who presented to the ER today with complaint of pain consistent with his typical sickle cell crisis.  Pain is in the lower back.  Also his hips bilaterally.  His symptoms have been going on for several days.  He tried all his home medications but no relief.  Was seen in the ER and evaluated.  Patient was also seen at the day hospital 4 days ago and treated for same.  In the ER he received up to  6 mg of IV Dilaudid  pushes but no relief.  Patient being admitted to the medical service for treatment of his sickle cell crisis.   Consultants: None  Procedures: Chest x-ray  Antibiotics: None at the moment  HPI/Subjective: Patient has had tachycardia, fever, worsening pain.  He is also hypoxic with oxygen  sats dropping into the 70s requiring 4 L of oxygen .  Objective: Vitals:   03/05/24 0915 03/05/24 0936 03/05/24 0941 03/05/24 0944  BP:  93/64    Pulse:  (!) 122  (!) 114  Resp: 12 16  20   Temp:  99.1 F (37.3 C)    TempSrc:  Oral    SpO2:  (!) 74% (!) 89% 91%  Weight:      Height:       Weight change:   Intake/Output Summary (Last 24 hours) at 03/05/2024 0945 Last data filed at 03/05/2024 0519 Gross per 24 hour  Intake 921 ml  Output 1100 ml  Net -179 ml    General: Alert, awake, oriented x3, in no acute distress.  HEENT: [place]/AT PEERL, EOMI Neck: Trachea midline,  no masses, no thyromegal,y no JVD, no carotid bruit OROPHARYNX:  Moist, No exudate/ erythema/lesions.  Heart: Regular rate and rhythm, without murmurs, rubs, gallops, PMI non-displaced, no heaves or thrills on palpation.  Lungs: Clear to auscultation, no wheezing or rhonchi noted. No increased vocal fremitus resonant to percussion  Abdomen: Soft, nontender, nondistended, positive bowel sounds, no masses no hepatosplenomegaly noted..  Neuro: No focal neurological deficits noted cranial nerves II through XII grossly intact. DTRs 2+ bilaterally upper and lower extremities. Strength 5 out of 5 in bilateral  upper and lower extremities. Musculoskeletal: No warm swelling or erythema around joints, no spinal tenderness noted. Psychiatric: Patient alert and oriented x3, good insight and cognition, good recent to remote recall. Lymph node survey: No cervical axillary or inguinal lymphadenopathy noted.   Data Reviewed: Basic Metabolic Panel: Recent Labs  Lab 02/29/24 0140 03/04/24 0001 03/05/24 0545  NA 142  143 135  K 3.5 3.8 4.2  CL 110 111 106  CO2 24 24 22   GLUCOSE 95 96 96  BUN 9 <5* 6  CREATININE 0.70 0.82 0.92  CALCIUM  8.2* 8.3* 7.7*   Liver Function Tests: Recent Labs  Lab 02/29/24 0140 03/04/24 0001 03/05/24 0545  AST 31 30 48*  ALT 18 18 21   ALKPHOS 71 64 64  BILITOT 6.8* 5.8* 7.5*  PROT 7.3 7.5 6.9  ALBUMIN 3.6 3.6 3.4*   No results for input(s): LIPASE, AMYLASE in the last 168 hours. No results for input(s): AMMONIA in the last 168 hours. CBC: Recent Labs  Lab 02/29/24 0140 03/04/24 0001 03/05/24 0545  WBC 9.3 7.5 8.5  NEUTROABS 2.5 2.4 3.7  HGB 7.5* 8.1* 7.1*  HCT 21.5* 22.5* 19.8*  MCV 102.9* 103.7* 99.0  PLT 323 355 288   Cardiac Enzymes: No results for input(s): CKTOTAL, CKMB, CKMBINDEX, TROPONINI in the last 168 hours. BNP (last 3 results) No results for input(s): BNP in the last 8760 hours.  ProBNP (last 3 results) Recent Labs    01/16/24 1816  PROBNP 2,143.0*    CBG: No results for input(s): GLUCAP in the last 168 hours.  No results found for this or any previous visit (from the past 240 hours).   Studies: DG Chest Portable 1 View Result Date: 02/29/2024 CLINICAL DATA:  Sickle cell crisis.  Hypoxia. EXAM: PORTABLE CHEST 1 VIEW COMPARISON:  02/09/2024 FINDINGS: The cardio pericardial silhouette is enlarged. Subtle parahilar opacities bilaterally evident, decreased in the interval. Interstitial markings are diffusely coarsened with chronic features. Right Port-A-Cath again noted. Telemetry leads overlie the chest. IMPRESSION: Interval decrease in parahilar opacities suggesting improving edema. Electronically Signed   By: Camellia Candle M.D.   On: 02/29/2024 06:14   ECHOCARDIOGRAM COMPLETE Result Date: 02/09/2024    ECHOCARDIOGRAM REPORT   Patient Name:   Martin [name] Date of Exam: 02/09/2024 Medical Rec #:  [id number]           Height:       75.0 in Accession #:    [id number]          Weight:       139.5 lb Date of  Birth:  [birth year]           BSA:          1.883 m Patient Age:    33 years            BP:           114/78 mmHg Patient Gender: M                   HR:           97 bpm. Exam Location:  Inpatient Procedure: 2D Echo, Cardiac Doppler and Color Doppler (Both Spectral and Color            Flow Doppler were utilized during procedure). Indications:    R07.9* Chest pain, unspecified  History:        Patient has prior history of Echocardiogram examinations, most  recent 11/08/2023. Signs/Symptoms:Murmur and Chest Pain. HIV.                 Sickle cell anemia. Severe TR. Pulmonary embolus.  Sonographer:    Ellouise Mose RDCS Referring Phys: [phone] [name] COVER IMPRESSIONS  1. Left ventricular ejection fraction, by estimation, is 65 to 70%. The left ventricle has normal function. The left ventricle has no regional wall motion abnormalities. There is mild concentric left ventricular hypertrophy. Left ventricular diastolic parameters were normal. There is the interventricular septum is flattened in systole and diastole, consistent with right ventricular pressure and volume overload.  2. Right ventricular systolic function is mildly reduced. The right ventricular size is severely enlarged. There is severely elevated pulmonary artery systolic pressure. The estimated right ventricular systolic pressure is 94.9 mmHg.  3. Right atrial size was severely dilated.  4. The mitral valve is abnormal. Moderate mitral valve regurgitation. No evidence of mitral stenosis.  5. Tricuspid valve regurgitation is moderate to severe.  6. The aortic valve is tricuspid. Aortic valve regurgitation is not visualized. No aortic stenosis is present.  7. The inferior vena cava is dilated in size with <50% respiratory variability, suggesting right atrial pressure of 15 mmHg. Comparison(s): Prior images reviewed side by side. Changes from prior study are noted. RV enlarged, RVSP higher compared to prior--now severely dilated RV with RVSP 95  mmHg. Findings communicated to Dr. Jegede. FINDINGS  Left Ventricle: Left ventricular ejection fraction, by estimation, is 65 to 70%. The left ventricle has normal function. The left ventricle has no regional wall motion abnormalities. The left ventricular internal cavity size was normal in size. There is  mild concentric left ventricular hypertrophy. The interventricular septum is flattened in systole and diastole, consistent with right ventricular pressure and volume overload. Left ventricular diastolic parameters were normal. Right Ventricle: The right ventricular size is severely enlarged. No increase in right ventricular wall thickness. Right ventricular systolic function is mildly reduced. There is severely elevated pulmonary artery systolic pressure. The tricuspid regurgitant velocity is 4.47 m/s, and with an assumed right atrial pressure of 15 mmHg, the estimated right ventricular systolic pressure is 94.9 mmHg. Left Atrium: Left atrial size was normal in size. Right Atrium: Right atrial size was severely dilated. Pericardium: Trivial pericardial effusion is present. Mitral Valve: Mitral valve bowing without frank prolapse. The mitral valve is abnormal. Moderate mitral valve regurgitation. No evidence of mitral valve stenosis. Tricuspid Valve: The tricuspid valve is grossly normal. Tricuspid valve regurgitation is moderate to severe. No evidence of tricuspid stenosis. Aortic Valve: The aortic valve is tricuspid. Aortic valve regurgitation is not visualized. No aortic stenosis is present. Pulmonic Valve: The pulmonic valve was normal in structure. Pulmonic valve regurgitation is trivial. No evidence of pulmonic stenosis. Aorta: The aortic root, ascending aorta, aortic arch and descending aorta are all structurally normal, with no evidence of dilitation or obstruction. Venous: The inferior vena cava is dilated in size with less than 50% respiratory variability, suggesting right atrial pressure of 15 mmHg.  IAS/Shunts: There is left bowing of the interatrial septum, suggestive of elevated right atrial pressure. No atrial level shunt detected by color flow Doppler.  LEFT VENTRICLE PLAX 2D LVIDd:         4.30 cm     Diastology LVIDs:         2.60 cm     LV e' medial:    7.18 cm/s LV PW:         1.10 cm  LV E/e' medial:  9.2 LV IVS:        1.20 cm     LV e' lateral:   14.50 cm/s LVOT diam:     2.40 cm     LV E/e' lateral: 4.5 LV SV:         75 LV SV Index:   40 LVOT Area:     4.52 cm LV IVRT:       81 msec  LV Volumes (MOD) LV vol d, MOD A2C: 99.2 ml LV vol d, MOD A4C: 39.1 ml LV vol s, MOD A2C: 26.7 ml LV vol s, MOD A4C: 10.3 ml LV SV MOD A2C:     72.5 ml LV SV MOD A4C:     39.1 ml LV SV MOD BP:      48.7 ml RIGHT VENTRICLE             IVC RV S prime:     14.30 cm/s  IVC diam: 3.00 cm TAPSE (M-mode): 1.3 cm LEFT ATRIUM           Index        RIGHT ATRIUM           Index LA diam:      3.50 cm 1.86 cm/m   RA Area:     25.90 cm LA Vol (A2C): 37.6 ml 19.97 ml/m  RA Volume:   95.30 ml  50.61 ml/m LA Vol (A4C): 33.9 ml 18.00 ml/m  AORTIC VALVE             PULMONIC VALVE LVOT Vmax:   107.00 cm/s PR End Diast Vel: 2.81 msec LVOT Vmean:  67.900 cm/s LVOT VTI:    0.166 m  AORTA Ao Root diam: 3.10 cm Ao Asc diam:  3.30 cm MITRAL VALVE                  TRICUSPID VALVE MV Area (PHT): 3.99 cm       TR Peak grad:   79.9 mmHg MV Decel Time: 190 msec       TR Vmax:        447.00 cm/s MR Peak grad:    83.5 mmHg MR Mean grad:    46.0 mmHg    SHUNTS MR Vmax:         457.00 cm/s  Systemic VTI:  0.17 m MR Vmean:        317.0 cm/s   Systemic Diam: 2.40 cm MR PISA:         1.01 cm MR PISA Eff ROA: 8 mm MR PISA Radius:  0.40 cm MV E velocity: 65.75 cm/s MV A velocity: 54.80 cm/s MV E/A ratio:  1.20 Shelda Bruckner MD Electronically signed by Shelda Bruckner MD Signature Date/Time: 02/09/2024/3:51:44 PM    Final    DG CHEST PORT 1 VIEW Result Date: 02/09/2024 CLINICAL DATA:  Increasing shortness of breath EXAM:  PORTABLE CHEST 1 VIEW COMPARISON:  Film from the previous day. FINDINGS: Cardiac shadow is enlarged but stable accentuated by the portable technique. Right chest wall port is again seen and stable. Increasing central vascular congestion is noted. Increased airspace opacity on the right is noted likely representing edema. No acute bony abnormality is seen. IMPRESSION: Increasing vascular congestion and likely edema particularly on the right. Electronically Signed   By: Oneil Devonshire M.D.   On: 02/09/2024 02:18   CT Angio Chest PE W and/or Wo Contrast Result Date: 02/08/2024 EXAM: CTA CHEST, ABDOMEN AND PELVIS WITHOUT AND WITH  CONTRAST 02/08/2024 03:23:01 AM TECHNIQUE: CTA of the chest was performed without and with the administration of intravenous contrast. CTA of the abdomen and pelvis was performed without and with the administration of intravenous contrast. Multiplanar reformatted images are provided for review. MIP images are provided for review. Automated exposure control, iterative reconstruction, and/or weight based adjustment of the mA/kV was utilized to reduce the radiation dose to as low as reasonably achievable. COMPARISON: None available. CLINICAL HISTORY: Pulmonary embolism (PE) suspected, high prob. Placed patient on 4L [place]. Patient is normally on this amount when he is in crisis. O2 is staying around 90-92 which patient states he stays in the low 90s normally. 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 FINDINGS: VASCULATURE: AORTA: No acute finding. No abdominal aortic aneurysm. No dissection. PULMONARY ARTERIES: The main pulmonary artery is enlarged in caliber measuring up to 4 cm. Interval increase in size of a right lower lobe segmental and subsegmental pulmonary embolus (11:227). Question interval development of a left upper lobe subsegmental pulmonary embolus (11:100). GREAT VESSELS OF AORTIC ARCH: No acute finding. No dissection.  No arterial occlusion or significant stenosis. CELIAC TRUNK: No acute finding. No occlusion or significant stenosis. SUPERIOR MESENTERIC ARTERY: No acute finding. No occlusion or significant stenosis. INFERIOR MESENTERIC ARTERY: No acute finding. No occlusion or significant stenosis. RENAL ARTERIES: No acute finding. No occlusion or significant stenosis. ILIAC ARTERIES: No acute finding. No occlusion or significant stenosis. CHEST: MEDIASTINUM: Stable Enlarged right hilar lymph nodes measuring up to 2.3 cm. Stable Enlarged left hilar lymph nodes measuring up to 1 cm. Stable Enlarged prevascular lymph node measuring up to 1.2 cm. Stable enlarged right heart Otherwise heart and pericardium demonstrate no acute abnormality. LUNGS AND PLEURA: Chronic architectural distortion. Mild paraseptal emphysematous changes. Persistent interlobular septal wall thickening with mosaic attenuation of the lungs. Interval increase of patchy ground glass airspace opacities that are most prominent along the right middle lobe. Interlobular septal wall thickening. No pulmonary infarction. No evidence of pleural effusion or pneumothorax. THORACIC BONES AND SOFT TISSUES: Slight heterogeneity of the axial and appendicular skeleton likely related to sickle cell disease. Prominent but not enlarged bilateral axillary lymph nodes. ABDOMEN AND PELVIS: LIVER: Portal edema. Multiple vague pericentimeter hepatic lesions more prominent along the left hepatic lobe (2:27). Enlarged liver measuring up to 22 cm. GALLBLADDER AND BILE DUCTS: Status post cholecystectomy. No biliary ductal dilatation. 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 PANCREAS: The pancreas is unremarkable. ADRENAL GLANDS: Bilateral adrenal glands demonstrate no acute abnormality. KIDNEYS, URETERS AND BLADDER: No stones in the kidneys or ureters. No hydronephrosis. No perinephric or periureteral stranding. Urinary bladder is  unremarkable. GI AND BOWEL: Stomach and duodenal sweep demonstrate no acute abnormality. There is no bowel obstruction. No abnormal bowel wall thickening or distension. The appendix is not definitely identified, with no inflammatory changes in the right lower quadrant to suggest acute appendicitis. REPRODUCTIVE: Reproductive organs are unremarkable. PERITONEUM AND RETROPERITONEUM: No ascites or free air. LYMPH NODES: No lymphadenopathy. ABDOMINAL BONES AND SOFT TISSUES: Slight heterogeneity of the axial and appendicular skeleton likely related to sickle cell disease. No acute soft tissue abnormality. IMPRESSION: 1. Interval increase in size of a right lower lobe segmental and subsegmental pulmonary embolus, with possible new left upper lobe subsegmental pulmonary embolus. No pulmonary infarction. Chronic right heart failure with no findings to suggest worsened acute right heart strain. 2. Persistent mosaic attenuation and  interlobular septal thickening of the lungs with superimposed interval increase of patchy ground glass airspace opacities that are most prominent along the right middle lobe has been questioned for infection /inflammation . 3. Enlarged main pulmonary artery measuring up to 4 cm. Finding consistent with pulmonary hypertension. 4. Hepatomegaly with multiple vague pericentimeter hepatic lesions more prominent along the left hepatic lobe and portal edema. Question developing nutmeg liver morphology. 5. Stable and large bilateral hilar and mediastinal lymph nodes, as well as prominence but not enlarged bilateral axillary lymph nodes. 6. Other, non-acute and/or normal findings as above. Electronically signed by: Morgane Naveau MD 02/08/2024 03:45 AM EDT RP Workstation: [id number]   CT ABDOMEN PELVIS W CONTRAST Result Date: 02/08/2024 EXAM: CTA CHEST, ABDOMEN AND PELVIS WITHOUT AND WITH CONTRAST 02/08/2024 03:23:01 AM TECHNIQUE: CTA of the chest was performed without and with the administration of  intravenous contrast. CTA of the abdomen and pelvis was performed without and with the administration of intravenous contrast. Multiplanar reformatted images are provided for review. MIP images are provided for review. Automated exposure control, iterative reconstruction, and/or weight based adjustment of the mA/kV was utilized to reduce the radiation dose to as low as reasonably achievable. COMPARISON: None available. CLINICAL HISTORY: Pulmonary embolism (PE) suspected, high prob. Placed patient on 4L [place]. Patient is normally on this amount when he is in crisis. O2 is staying around 90-92 which patient states he stays in the low 90s normally. 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 FINDINGS: VASCULATURE: AORTA: No acute finding. No abdominal aortic aneurysm. No dissection. PULMONARY ARTERIES: The main pulmonary artery is enlarged in caliber measuring up to 4 cm. Interval increase in size of a right lower lobe segmental and subsegmental pulmonary embolus (11:227). Question interval development of a left upper lobe subsegmental pulmonary embolus (11:100). GREAT VESSELS OF AORTIC ARCH: No acute finding. No dissection. No arterial occlusion or significant stenosis. CELIAC TRUNK: No acute finding. No occlusion or significant stenosis. SUPERIOR MESENTERIC ARTERY: No acute finding. No occlusion or significant stenosis. INFERIOR MESENTERIC ARTERY: No acute finding. No occlusion or significant stenosis. RENAL ARTERIES: No acute finding. No occlusion or significant stenosis. ILIAC ARTERIES: No acute finding. No occlusion or significant stenosis. CHEST: MEDIASTINUM: Stable Enlarged right hilar lymph nodes measuring up to 2.3 cm. Stable Enlarged left hilar lymph nodes measuring up to 1 cm. Stable Enlarged prevascular lymph node measuring up to 1.2 cm. Stable enlarged right heart Otherwise heart and pericardium demonstrate no acute abnormality. LUNGS AND PLEURA:  Chronic architectural distortion. Mild paraseptal emphysematous changes. Persistent interlobular septal wall thickening with mosaic attenuation of the lungs. Interval increase of patchy ground glass airspace opacities that are most prominent along the right middle lobe. Interlobular septal wall thickening. No pulmonary infarction. No evidence of pleural effusion or pneumothorax. THORACIC BONES AND SOFT TISSUES: Slight heterogeneity of the axial and appendicular skeleton likely related to sickle cell disease. Prominent but not enlarged bilateral axillary lymph nodes. ABDOMEN AND PELVIS: LIVER: Portal edema. Multiple vague pericentimeter hepatic lesions more prominent along the left hepatic lobe (2:27). Enlarged liver measuring up to 22 cm. GALLBLADDER AND BILE DUCTS: Status post cholecystectomy. No biliary ductal dilatation. 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 PANCREAS: The pancreas is unremarkable. ADRENAL GLANDS: Bilateral adrenal glands demonstrate no acute abnormality. KIDNEYS, URETERS AND BLADDER: No stones in the kidneys or ureters. No hydronephrosis. No perinephric or periureteral stranding. Urinary bladder is  unremarkable. GI AND BOWEL: Stomach and duodenal sweep demonstrate no acute abnormality. There is no bowel obstruction. No abnormal bowel wall thickening or distension. The appendix is not definitely identified, with no inflammatory changes in the right lower quadrant to suggest acute appendicitis. REPRODUCTIVE: Reproductive organs are unremarkable. PERITONEUM AND RETROPERITONEUM: No ascites or free air. LYMPH NODES: No lymphadenopathy. ABDOMINAL BONES AND SOFT TISSUES: Slight heterogeneity of the axial and appendicular skeleton likely related to sickle cell disease. No acute soft tissue abnormality. IMPRESSION: 1. Interval increase in size of a right lower lobe segmental and subsegmental pulmonary embolus, with possible new left upper lobe  subsegmental pulmonary embolus. No pulmonary infarction. Chronic right heart failure with no findings to suggest worsened acute right heart strain. 2. Persistent mosaic attenuation and interlobular septal thickening of the lungs with superimposed interval increase of patchy ground glass airspace opacities that are most prominent along the right middle lobe has been questioned for infection /inflammation . 3. Enlarged main pulmonary artery measuring up to 4 cm. Finding consistent with pulmonary hypertension. 4. Hepatomegaly with multiple vague pericentimeter hepatic lesions more prominent along the left hepatic lobe and portal edema. Question developing nutmeg liver morphology. 5. Stable and large bilateral hilar and mediastinal lymph nodes, as well as prominence but not enlarged bilateral axillary lymph nodes. 6. Other, non-acute and/or normal findings as above. Electronically signed by: Morgane Naveau MD 02/08/2024 03:45 AM EDT RP Workstation: [id number]   DG Chest Portable 1 View Result Date: 02/08/2024 EXAM: 1 VIEW(S) XRAY OF THE CHEST 02/08/2024 12:28:00 AM COMPARISON: 01/16/2024 CLINICAL HISTORY: sob beyond baseline, chest pain. Pt having sickle cell criss, SOB and chest pain. FINDINGS: LINES, TUBES AND DEVICES: Similar positioning of the right chest port-a-cath with loop catheter tubing at the thoracic inlet. The tip is in the low SVC. LUNGS AND PLEURA: Prominent central pulmonary arteries. Similar bilateral interstitial coarsening. Question vague ground-glass opacities in the mid lungs. Right apical pleural parenchymal scarring. No pulmonary edema. No pleural effusion. No pneumothorax. HEART AND MEDIASTINUM: Stable cardiomediastinal silhouette. BONES AND SOFT TISSUES: No acute osseous abnormality. IMPRESSION: 1. Similar interstitial coarsening with possible vague mid-lung ground-glass opacities. This may be projectional artifact, or due to edema or infection. Electronically signed by: Norman Gatlin MD  02/08/2024 12:36 AM EDT RP Workstation: [id number]    Scheduled Meds:  abacavir -dolutegravir -lamiVUDine   1 tablet Oral Daily   bisacodyl   10 mg Rectal Once   dabigatran  150 mg Oral BID   diphenhydrAMINE   25 mg Oral Once   folic acid   500 mcg Oral Daily   HYDROmorphone    Intravenous Q4H   hydroxyurea   2,000 mg Oral QHS   senna-docusate  1 tablet Oral BID   Continuous Infusions:  dextrose  5 % and 0.45 % NaCl 125 mL/hr at 03/05/24 0534    Active Problems:   Acute on chronic anemia   HIV (human immunodeficiency virus infection) (HCC)   History of pulmonary embolism   Generalized anxiety disorder   GERD (gastroesophageal reflux disease)   Sickle cell pain crisis (HCC)

## 2024-03-06 MED ORDER — ACETAMINOPHEN 325 MG PO TABS: 325.0000 mg | ORAL_TABLET | Freq: Four times a day (QID) | ORAL | Status: DC | PRN

## 2024-03-06 MED ORDER — OXYCODONE HCL 5 MG PO TABS: 10.0000 mg | ORAL_TABLET | ORAL | Status: DC | PRN

## 2024-03-06 MED ORDER — SODIUM CHLORIDE 0.9% FLUSH
10.0000 mL | Freq: Two times a day (BID) | INTRAVENOUS | Status: DC
  Administered 2024-03-06 – 2024-03-10 (×9): 10 mL
  Administered 2024-03-11: 20 mL
  Administered 2024-03-11 – 2024-03-12 (×2): 10 mL

## 2024-03-06 MED ORDER — DABIGATRAN ETEXILATE MESYLATE 150 MG PO CAPS: 150.0000 mg | ORAL_CAPSULE | Freq: Two times a day (BID) | ORAL | Status: DC

## 2024-03-06 MED ORDER — GUAIFENESIN ER 600 MG PO TB12
600.0000 mg | ORAL_TABLET | Freq: Two times a day (BID) | ORAL | Status: DC
  Administered 2024-03-07 – 2024-03-11 (×9): 600 mg via ORAL
  Filled 2024-03-06 (×12): qty 1

## 2024-03-06 MED ORDER — OXYCODONE HCL 5 MG PO TABS
10.0000 mg | ORAL_TABLET | ORAL | Status: DC | PRN
  Administered 2024-03-06 – 2024-03-12 (×19): 10 mg via ORAL
  Filled 2024-03-06 (×19): qty 2

## 2024-03-06 MED ORDER — ACETAMINOPHEN 325 MG PO TABS
325.0000 mg | ORAL_TABLET | ORAL | Status: DC | PRN
  Administered 2024-03-12: 325 mg via ORAL
  Filled 2024-03-06 (×3): qty 1

## 2024-03-06 MED ORDER — SODIUM CHLORIDE 0.9% FLUSH: 10.0000 mL | INTRAVENOUS | Status: DC | PRN

## 2024-03-06 MED ORDER — OXYCODONE-ACETAMINOPHEN 10-325 MG PO TABS: 1.0000 | ORAL_TABLET | ORAL | Status: DC | PRN

## 2024-03-06 MED ORDER — CHLORHEXIDINE GLUCONATE CLOTH 2 % EX PADS: 6.0000 | MEDICATED_PAD | Freq: Every day | CUTANEOUS | Status: DC

## 2024-03-06 MED ORDER — ALBUTEROL SULFATE (2.5 MG/3ML) 0.083% IN NEBU: 3.0000 mL | INHALATION_SOLUTION | Freq: Four times a day (QID) | RESPIRATORY_TRACT | Status: DC | PRN

## 2024-03-06 NOTE — Progress Notes (Signed)
 Patient refused blood cultures, refused Senna but took all other meds as ordered, c/o pain to port site, evaluated by IR via provider order ,verified with this nurse port is working sufficiently, no interventions per provider, recent CXR shows PE improving, APP reviewed labs and chart, no new orders, will reach out to provider if there are any acute changes, patient in bed resting, call light in reach

## 2024-03-06 NOTE — Plan of Care (Signed)

## 2024-03-06 NOTE — Progress Notes (Signed)
 Patient ID: Martin [name], male   DOB: 1990-10-11, 33 y.o.   MRN: [id number] Asked to evaluate pt's right chest wall port site secondary to reported pain in area. Port was placed at [facility] in [place] ,[place] in Jan 2023. Hx sickle cell dz/ crises. Temp 99.6, WBC nl. No blood cx data available. On exam port site is clean and dry, no signs of erythema/edema; port currently accessed and functioning appropriately per nursing. Port site is non tender to palpation. Pt does have some rt shoulder discomfort. Latest CXR reviewed by Dr. Hughes. Pt does have a loop in mid portion of catheter which has been present since 2024 but does not currently affect function. Recommend cont current port for now as it is functioning well and does not appear to be infected. Should pt's clinical status worsen or port site appearance/function change please reach out to IR team directly at [phone] for further evaluation.

## 2024-03-06 NOTE — Progress Notes (Signed)
 Patient ID: Martin [name], male   DOB: Jan 25, 1991, 33 y.o.   MRN: [id number] Subjective: Martin [name]  is a 33 y.o. male with history of sickle cell disease, history of acute chest syndrome, multiple sickle cell crisis, multiple ED visits and hospital admission, history of PE currently on Xerelto, chronic hypoxic respiratory failure on home oxygen , HIV infection and generalized anxiety disorder who presented to the ED with major complaints of generalized body pain, lower back pain, and bilateral hip pain that is not consistent with his typical sickle cell pain crisis.  Patient is endorsing pain of 9/10 today. He has no new concerns. Denies subjective fever, N/V/D, no headache, cough. No urinary symptoms.   Objective:  Vital signs in last 24 hours:  Vitals:   03/05/24 2327 03/06/24 0055 03/06/24 0357 03/06/24 0419  BP: 103/69  92/65   Pulse: (!) 107  100   Resp: 18 16 18 16   Temp: 99.1 F (37.3 C)  98.9 F (37.2 C)   TempSrc: Oral  Oral   SpO2: 91% 90% 94% 92%  Weight:      Height:        Intake/Output from previous day:   Intake/Output Summary (Last 24 hours) at 03/06/2024 1016 Last data filed at 03/05/2024 1927 Gross per 24 hour  Intake --  Output 400 ml  Net -400 ml    Physical Exam: General: Alert, awake, oriented x3, in no acute distress.  HEENT: [place]/AT [place]ERL, EOMI Neck: Trachea midline,  no masses, no thyromegal,y no JVD, no carotid bruit OROPHARYNX:  Moist, No exudate/ erythema/lesions.  Heart: Regular rate and rhythm, without murmurs, rubs, gallops, PMI non-displaced, no heaves or thrills on palpation.  Lungs: Improving crackles on the right lung fields.  No wheezing or rhonchi noted. No increased vocal fremitus resonant to percussion  Abdomen: Soft, nontender, nondistended, positive bowel sounds, no masses no hepatosplenomegaly noted..  Neuro: No focal neurological deficits noted cranial nerves II through XII grossly intact. DTRs 2+ bilaterally upper and  lower extremities. Strength 5 out of 5 in bilateral upper and lower extremities. Musculoskeletal: Generalized body tenderness. Psychiatric: Patient alert and oriented x3, good insight and cognition, good recent to remote recall. Lymph node survey: No cervical axillary or inguinal lymphadenopathy noted.  Lab Results:  Basic Metabolic Panel:    Component Value Date/Time   NA 135 03/05/2024 0545   NA 140 01/12/2023 1607   K 4.2 03/05/2024 0545   CL 106 03/05/2024 0545   CO2 22 03/05/2024 0545   BUN 6 03/05/2024 0545   BUN 8 01/12/2023 1607   CREATININE 0.92 03/05/2024 0545   GLUCOSE 96 03/05/2024 0545   CALCIUM  7.7 (L) 03/05/2024 0545   CBC:    Component Value Date/Time   WBC 8.5 03/05/2024 0545   HGB 7.1 (L) 03/05/2024 0545   HGB 7.4 (L) 01/12/2023 1607   HCT 19.8 (L) 03/05/2024 0545   HCT 21.7 (L) 01/12/2023 1607   PLT 288 03/05/2024 0545   PLT 321 01/12/2023 1607   MCV 99.0 03/05/2024 0545   MCV 94 01/12/2023 1607   NEUTROABS 3.7 03/05/2024 0545   NEUTROABS 3.8 01/12/2023 1607   LYMPHSABS 3.5 03/05/2024 0545   LYMPHSABS 5.6 (H) 01/12/2023 1607   MONOABS 1.0 03/05/2024 0545   EOSABS 0.2 03/05/2024 0545   EOSABS 0.2 01/12/2023 1607   BASOSABS 0.0 03/05/2024 0545   BASOSABS 0.1 01/12/2023 1607    No results found for this or any previous visit (from the past 240 hours).  Studies/Results: DG Chest Port 1 View Result Date: 03/05/2024 CLINICAL DATA:  Acute hypoxic respiratory failure. EXAM: PORTABLE CHEST 1 VIEW COMPARISON:  Radiographs 02/29/2024 and 02/09/2024.  CT 02/08/2024. FINDINGS: 1000 hours. Accessed right IJ Port-A-Cath is unchanged, extending to the lower SVC level. The heart size and mediastinal contours are stable with mild cardiomegaly and central enlargement of the pulmonary arteries. There has been further improvement in the pulmonary edema. New complete airspace disease, pneumothorax or significant pleural effusion. The bones appear unremarkable.  IMPRESSION: Further improvement in pulmonary edema. No new findings. Electronically Signed   By: Elsie Perone M.D.   On: 03/05/2024 14:18    Medications: Scheduled Meds:  abacavir -dolutegravir -lamiVUDine   1 tablet Oral Daily   Chlorhexidine  Gluconate Cloth  6 each Topical Daily   dabigatran  150 mg Oral BID   diphenhydrAMINE   25 mg Oral Once   folic acid   500 mcg Oral Daily   HYDROmorphone    Intravenous Q4H   hydroxyurea   2,000 mg Oral QHS   senna-docusate  1 tablet Oral BID   sodium chloride  flush  10-40 mL Intracatheter Q12H   Continuous Infusions: PRN Meds:.cyclobenzaprine , diphenhydrAMINE , gabapentin , HYDROmorphone  (DILAUDID ) injection, lip balm, naloxone  **AND** sodium chloride  flush, ondansetron  (ZOFRAN ) IV, polyethylene glycol, sodium chloride  flush  Consultants: Cardiologist [completed]  Procedures: Echocardiogram [completed]  Antibiotics: None  Assessment/Plan: Active Problems:   Generalized anxiety disorder   GERD (gastroesophageal reflux disease)   HIV (human immunodeficiency virus infection) (HCC)   History of pulmonary embolism   Acute on chronic anemia   Sickle cell pain crisis (HCC) Sickle cell pain crisis: Patient is slowly improving, continue Dilaudid  PCA on current dose, and IV fluids. continue oral home pain medications as ordered. Monitor vitals very closely, Re-evaluate pain scale regularly, 2 L of Oxygen  by [place]. Patient encouraged to ambulate on the hallway today.  Anemia of chronic disease: Monitor daily CBC hgb is within patients baseline. No clinical indication for blood transfusion.  SIRS: Patient had low temperature, tachycardia, urinalysis completed, negative for UTI.  Chest x-ray shows further improvement in pulmonary edema.  and blood cultures completed results pending.  Chronic pain syndrome: Continue chronic pain medications. HIV disease: Continue home regimen GERD: Continue PPIs History of PE: Continue monitoring.  We may get CTA if symptoms  continue Generalized anxiety disorder: Continue other supportive care  Code Status: Full Code Family Communication: N/A Disposition Plan: Ready for discharge in the AM.  [name] Cover NP  If 7PM-7AM, please contact night-coverage.  03/06/2024, 10:16 AM  LOS: 2 days

## 2024-03-06 NOTE — Plan of Care (Signed)

## 2024-03-07 ENCOUNTER — Telehealth: Payer: Self-pay

## 2024-03-07 LAB — URINE DRUG SCREEN
Amphetamines: NEGATIVE
Barbiturates: NEGATIVE
Benzodiazepines: NEGATIVE
Cocaine: NEGATIVE
Fentanyl: NEGATIVE
Methadone Scn, Ur: NEGATIVE
Opiates: POSITIVE — AB
Tetrahydrocannabinol: NEGATIVE

## 2024-03-07 LAB — URINALYSIS, COMPLETE (UACMP) WITH MICROSCOPIC
Bacteria, UA: NONE SEEN
Bilirubin Urine: NEGATIVE
Glucose, UA: NEGATIVE mg/dL
Ketones, ur: NEGATIVE mg/dL
Leukocytes,Ua: NEGATIVE
Nitrite: NEGATIVE
Protein, ur: NEGATIVE mg/dL
Specific Gravity, Urine: 1.011 (ref 1.005–1.030)
pH: 5 (ref 5.0–8.0)

## 2024-03-07 NOTE — Progress Notes (Signed)
   03/07/24 0922  TOC Brief Assessment  Insurance and Status Reviewed  Patient has primary care physician Yes  Home environment has been reviewed Homeless, but lives at his sister's home  Prior level of function: Independent  Forensic Psychologist Current home services  Social Drivers of Health Review SDOH reviewed no interventions necessary  Readmission risk has been reviewed Yes  Transition of care needs transition of care needs identified, TOC will continue to follow    Pt high readmission rate due to frequent episodes of sickle cell pain crisis. Pt currently homeless, but lives at his sister's home in [place]. Pt is on 2L of O2 at baseline. TOC will continue to follow for needs.  Signed: Heather Saltness, MSW, LCSW Clinical Social Worker Inpatient Care Management 03/07/2024 9:25 AM

## 2024-03-07 NOTE — Plan of Care (Signed)

## 2024-03-07 NOTE — Plan of Care (Signed)
  Problem: Self-Care: Goal: Ability to incorporate actions that prevent/reduce pain crisis will improve Outcome: Progressing   Problem: Tissue Perfusion: Goal: Complications related to inadequate tissue perfusion will be avoided or minimized Outcome: Progressing   Problem: Respiratory: Goal: Pulmonary complications will be avoided or minimized Outcome: Progressing Goal: Acute Chest Syndrome will be identified early to prevent complications Outcome: Progressing   Problem: Fluid Volume: Goal: Ability to maintain a balanced intake and output will improve Outcome: Progressing   Problem: Sensory: Goal: Pain level will decrease with appropriate interventions Outcome: Progressing

## 2024-03-07 NOTE — Progress Notes (Signed)
 Patient ID: Martin [name], male   DOB: 29-Jun-1990, 33 y.o.   MRN: [id number] Subjective: Martin [name]  is a 33 y.o. male with history of sickle cell disease, history of acute chest syndrome, multiple sickle cell crisis, multiple ED visits and hospital admission, history of PE currently on Xerelto, chronic hypoxic respiratory failure on home oxygen , HIV infection and generalized anxiety disorder who presented to the ED with major complaints of generalized body pain, lower back pain, and bilateral hip pain that is not consistent with his typical sickle cell pain crisis.  Patient is endorsing pain of 9/10 today in his right shoulder, he is concerned about his overall health. Patient is anxious and fears he may have had another blood clot. Patient is concerned that Pradaxa is not as therapeutic as Xarelto. Patients was re assured and going by his recent imaging there is further improvement in pulmonary edema with no new findings. Patient refused blood cultures prior but is willing to have labs completed today.   Denies subjective fever, N/V/D, no headache, cough. No urinary symptoms.   Objective:  Vital signs in last 24 hours:  Vitals:   03/07/24 0515 03/07/24 0537 03/07/24 0800 03/07/24 0944  BP:  (!) 93/55  103/73  Pulse:  98  96  Resp: 15 16 19 16   Temp:  98.4 F (36.9 C)  97.9 F (36.6 C)  TempSrc:  Oral  Oral  SpO2: 91% 92% 95% 91%  Weight:      Height:        Intake/Output from previous day:   Intake/Output Summary (Last 24 hours) at 03/07/2024 1050 Last data filed at 03/07/2024 0958 Gross per 24 hour  Intake 10 ml  Output 850 ml  Net -840 ml    Physical Exam: General: Alert, awake, oriented x3, in no acute distress.  HEENT: [place]/AT [place]ER[place], EOMI Neck: Trachea midline,  no masses, no thyromegal,y no JVD, no carotid bruit OROPHARYNX:  Moist, No exudate/ erythema/lesions.  Heart: Regular rate and rhythm, without murmurs, rubs, gallops, PMI non-displaced, no heaves or  thrills on palpation.  Lungs: Improving crackles on the right lung fields.  No wheezing or rhonchi noted. No increased vocal fremitus resonant to percussion  Abdomen: Soft, nontender, nondistended, positive bowel sounds, no masses no hepatosplenomegaly noted..  Neuro: No focal neurological deficits noted cranial nerves II through XII grossly intact. DTRs 2+ bilaterally upper and lower extremities. Strength 5 out of 5 in bilateral upper and lower extremities. Musculoskeletal: Generalized body tenderness. Psychiatric: Patient alert and oriented x3, good insight and cognition, good recent to remote recall. Lymph node survey: No cervical axillary or inguinal lymphadenopathy noted.  Lab Results:  Basic Metabolic Panel:    Component Value Date/Time   NA 135 03/05/2024 0545   NA 140 01/12/2023 1607   K 4.2 03/05/2024 0545   CL 106 03/05/2024 0545   CO2 22 03/05/2024 0545   BUN 6 03/05/2024 0545   BUN 8 01/12/2023 1607   CREATININE 0.92 03/05/2024 0545   GLUCOSE 96 03/05/2024 0545   CALCIUM  7.7 (L) 03/05/2024 0545   CBC:    Component Value Date/Time   WBC 8.5 03/05/2024 0545   HGB 7.1 (L) 03/05/2024 0545   HGB 7.4 (L) 01/12/2023 1607   HCT 19.8 (L) 03/05/2024 0545   HCT 21.7 (L) 01/12/2023 1607   PLT 288 03/05/2024 0545   PLT 321 01/12/2023 1607   MCV 99.0 03/05/2024 0545   MCV 94 01/12/2023 1607   NEUTROABS 3.7 03/05/2024 0545  NEUTROABS 3.8 01/12/2023 1607   LYMPHSABS 3.5 03/05/2024 0545   LYMPHSABS 5.6 (H) 01/12/2023 1607   MONOABS 1.0 03/05/2024 0545   EOSABS 0.2 03/05/2024 0545   EOSABS 0.2 01/12/2023 1607   BASOSABS 0.0 03/05/2024 0545   BASOSABS 0.1 01/12/2023 1607    No results found for this or any previous visit (from the past 240 hours).   Studies/Results: No results found.   Medications: Scheduled Meds:  abacavir -dolutegravir -lamiVUDine   1 tablet Oral Daily   Chlorhexidine  Gluconate Cloth  6 each Topical Daily   dabigatran  150 mg Oral BID    diphenhydrAMINE   25 mg Oral Once   folic acid   500 mcg Oral Daily   guaiFENesin   600 mg Oral BID   HYDROmorphone    Intravenous Q4H   hydroxyurea   2,000 mg Oral QHS   senna-docusate  1 tablet Oral BID   sodium chloride  flush  10-40 mL Intracatheter Q12H   Continuous Infusions: PRN Meds:.oxyCODONE  **AND** acetaminophen , albuterol , cyclobenzaprine , diphenhydrAMINE , gabapentin , lip balm, naloxone  **AND** sodium chloride  flush, ondansetron  (ZOFRAN ) IV, polyethylene glycol, sodium chloride  flush  Consultants: Cardiologist [completed]  Procedures: Echocardiogram [completed]  Antibiotics: None  Assessment/Plan: Active Problems:   Generalized anxiety disorder   GERD (gastroesophageal reflux disease)   HIV (human immunodeficiency virus infection) (HCC)   History of pulmonary embolism   Acute on chronic anemia   Sickle cell pain crisis (HCC) Sickle cell pain crisis: continue Dilaudid  PCA on current dose, IV fluids. continue oral home pain medications as ordered. Monitor vitals very closely, Re-evaluate pain scale regularly, 2 L of Oxygen  by [place].  Anemia of chronic disease: Monitor daily CBC hgb is within patients baseline. No clinical indication for blood transfusion.  SIRS: Patient had low temperature, tachycardia, urinalysis completed, negative for UTI.  Chest x-ray shows further improvement in pulmonary edema. Blood cultures pending.  Chronic pain syndrome: Continue chronic pain medications. HIV disease: Continue home regimen GERD: Continue PPIs History of PE: Continue monitoring.  We may get CTA if symptoms continue Generalized anxiety disorder: Continue other supportive care  Code Status: Full Code Family Communication: N/A Disposition Plan: Ready for discharge in the AM.  [name] Cover NP  If 7PM-7AM, please contact night-coverage.  03/07/2024, 10:50 AM  LOS: 3 days

## 2024-03-08 ENCOUNTER — Ambulatory Visit (HOSPITAL_COMMUNITY): Admitting: Cardiology

## 2024-03-08 ENCOUNTER — Inpatient Hospital Stay (HOSPITAL_COMMUNITY)

## 2024-03-08 DIAGNOSIS — M25511 Pain in right shoulder: Secondary | ICD-10-CM | POA: Insufficient documentation

## 2024-03-08 LAB — PREPARE RBC (CROSSMATCH)

## 2024-03-08 MED ORDER — ACETAMINOPHEN 325 MG PO TABS
650.0000 mg | ORAL_TABLET | Freq: Once | ORAL | Status: AC
  Administered 2024-03-08: 650 mg via ORAL
  Filled 2024-03-08: qty 2

## 2024-03-08 MED ORDER — SODIUM CHLORIDE 0.9% IV SOLUTION: Freq: Once | INTRAVENOUS | Status: AC

## 2024-03-08 MED ORDER — DIPHENHYDRAMINE HCL 50 MG/ML IJ SOLN
25.0000 mg | Freq: Once | INTRAMUSCULAR | Status: AC
  Administered 2024-03-08: 25 mg via INTRAVENOUS
  Filled 2024-03-08: qty 1

## 2024-03-08 MED ORDER — SODIUM CHLORIDE 0.45 % IV SOLN: INTRAVENOUS | Status: DC

## 2024-03-08 NOTE — Progress Notes (Signed)
 Patient ID: Martin [name], male   DOB: Sep 03, 1990, 33 y.o.   MRN: [id number] Subjective: Martin [name]  is a 33 y.o. male with history of sickle cell disease, history of acute chest syndrome, multiple sickle cell crisis, multiple ED visits and hospital admission, history of PE currently on Xerelto, chronic hypoxic respiratory failure on home oxygen , HIV infection and generalized anxiety disorder who presented to the ED with major complaints of generalized body pain, lower back pain, and bilateral hip pain that is not consistent with his typical sickle cell pain crisis.  Ongoing pain of 9/10 in Patients right shoulder and arms. Encouraged heating pad and avoid lying on it. Denies subjective fever, N/V/D, no headache, cough. No urinary symptoms.    Objective:  Vital signs in last 24 hours:  Vitals:   03/08/24 0436 03/08/24 0506 03/08/24 0719 03/08/24 1002  BP: 97/67   100/67  Pulse: (!) 106   95  Resp: 18 18 20 16   Temp: 97.7 F (36.5 C)   98.9 F (37.2 C)  TempSrc: Oral   Oral  SpO2: 95% 95% 92% 90%  Weight:      Height:        Intake/Output from previous day:   Intake/Output Summary (Last 24 hours) at 03/08/2024 1036 Last data filed at 03/08/2024 1012 Gross per 24 hour  Intake 10 ml  Output 1850 ml  Net -1840 ml    Physical Exam: General: Alert, awake, oriented x3, in no acute distress.  HEENT: [place]/AT PEERL, EOMI Neck: Trachea midline,  no masses, no thyromegal,y no JVD, no carotid bruit OROPHARYNX:  Moist, No exudate/ erythema/lesions.  Heart: Regular rate and rhythm, without murmurs, rubs, gallops, PMI non-displaced, no heaves or thrills on palpation.  Lungs: Improving crackles on the right lung fields.  No wheezing or rhonchi noted. No increased vocal fremitus resonant to percussion  Abdomen: Soft, nontender, nondistended, positive bowel sounds, no masses no hepatosplenomegaly noted..  Neuro: No focal neurological deficits noted cranial nerves II through XII  grossly intact. DTRs 2+ bilaterally upper and lower extremities. Strength 5 out of 5 in bilateral upper and lower extremities. Musculoskeletal: Generalized body tenderness. Psychiatric: Patient alert and oriented x3, good insight and cognition, good recent to remote recall. Lymph node survey: No cervical axillary or inguinal lymphadenopathy noted.  Lab Results:  Basic Metabolic Panel:    Component Value Date/Time   NA 135 03/05/2024 0545   NA 140 01/12/2023 1607   K 4.2 03/05/2024 0545   CL 106 03/05/2024 0545   CO2 22 03/05/2024 0545   BUN 6 03/05/2024 0545   BUN 8 01/12/2023 1607   CREATININE 0.92 03/05/2024 0545   GLUCOSE 96 03/05/2024 0545   CALCIUM  7.7 (L) 03/05/2024 0545   CBC:    Component Value Date/Time   WBC 8.5 03/05/2024 0545   HGB 7.1 (L) 03/05/2024 0545   HGB 7.4 (L) 01/12/2023 1607   HCT 19.8 (L) 03/05/2024 0545   HCT 21.7 (L) 01/12/2023 1607   PLT 288 03/05/2024 0545   PLT 321 01/12/2023 1607   MCV 99.0 03/05/2024 0545   MCV 94 01/12/2023 1607   NEUTROABS 3.7 03/05/2024 0545   NEUTROABS 3.8 01/12/2023 1607   LYMPHSABS 3.5 03/05/2024 0545   LYMPHSABS 5.6 (H) 01/12/2023 1607   MONOABS 1.0 03/05/2024 0545   EOSABS 0.2 03/05/2024 0545   EOSABS 0.2 01/12/2023 1607   BASOSABS 0.0 03/05/2024 0545   BASOSABS 0.1 01/12/2023 1607    No results found for this or any  previous visit (from the past 240 hours).   Studies/Results: No results found.   Medications: Scheduled Meds:  abacavir -dolutegravir -lamiVUDine   1 tablet Oral Daily   Chlorhexidine  Gluconate Cloth  6 each Topical Daily   dabigatran  150 mg Oral BID   diphenhydrAMINE   25 mg Oral Once   folic acid   500 mcg Oral Daily   guaiFENesin   600 mg Oral BID   HYDROmorphone    Intravenous Q4H   hydroxyurea   2,000 mg Oral QHS   senna-docusate  1 tablet Oral BID   sodium chloride  flush  10-40 mL Intracatheter Q12H   Continuous Infusions:  sodium chloride      PRN Meds:.oxyCODONE  **AND**  acetaminophen , albuterol , cyclobenzaprine , diphenhydrAMINE , gabapentin , lip balm, naloxone  **AND** sodium chloride  flush, ondansetron  (ZOFRAN ) IV, polyethylene glycol, sodium chloride  flush  Consultants: Cardiologist [completed]  Procedures: Echocardiogram [completed]  Antibiotics: None  Assessment/Plan: Active Problems:   Generalized anxiety disorder   GERD (gastroesophageal reflux disease)   HIV (human immunodeficiency virus infection) (HCC)   History of pulmonary embolism   Acute on chronic anemia   Sickle cell pain crisis (HCC)   Right shoulder pain Sickle cell pain crisis: continue Dilaudid  PCA on current dose, IV fluids restarted for 24 hours to help  with hydration. continue oral home pain medications as ordered. Monitor vitals very closely, Re-evaluate pain scale regularly, 2 L of Oxygen  by [place].  Anemia of chronic disease: Monitor daily CBC hgb is within patients baseline. No clinical indication for blood transfusion.  SIRS: Patient had low temperature, tachycardia, urinalysis completed, negative for UTI.  Chest x-ray shows further improvement in pulmonary edema. Blood cultures pending.  Right shoulder pain: X-ray ordered, heating pad, continue pain medication as ordered.  Chronic pain syndrome: Continue chronic pain medications. HIV disease: Continue home regimen GERD: Continue PPIs History of PE: Continue monitoring.  We may get CTA if symptoms continue Generalized anxiety disorder: Continue other supportive care  Code Status: Full Code Family Communication: N/A Disposition Plan: Ready for discharge in the AM.  [name] Cover NP  If 7PM-7AM, please contact night-coverage.  03/08/2024, 10:36 AM  LOS: 4 days

## 2024-03-08 NOTE — Plan of Care (Signed)

## 2024-03-08 NOTE — Plan of Care (Signed)
  Problem: Activity: Goal: Risk for activity intolerance will decrease Outcome: Progressing   Problem: Coping: Goal: Level of anxiety will decrease Outcome: Progressing   Problem: Pain Managment: Goal: General experience of comfort will improve and/or be controlled Outcome: Progressing   Problem: Safety: Goal: Ability to remain free from injury will improve Outcome: Progressing

## 2024-03-09 LAB — HEMOGLOBIN AND HEMATOCRIT, BLOOD
HCT: 20 % — ABNORMAL LOW (ref 39.0–52.0)
Hemoglobin: 7.1 g/dL — ABNORMAL LOW (ref 13.0–17.0)

## 2024-03-09 MED ORDER — VANCOMYCIN HCL 1500 MG/300ML IV SOLN
1500.0000 mg | Freq: Once | INTRAVENOUS | Status: DC
  Filled 2024-03-09: qty 300

## 2024-03-09 MED ORDER — SODIUM CHLORIDE 0.9 % IV SOLN
2.0000 g | Freq: Three times a day (TID) | INTRAVENOUS | Status: DC
  Administered 2024-03-09 – 2024-03-12 (×9): 2 g via INTRAVENOUS
  Filled 2024-03-09 (×9): qty 12.5

## 2024-03-09 MED ORDER — ENSURE PLUS HIGH PROTEIN PO LIQD
237.0000 mL | Freq: Two times a day (BID) | ORAL | Status: DC
  Administered 2024-03-09 – 2024-03-10 (×3): 237 mL via ORAL

## 2024-03-09 MED ORDER — BISACODYL 10 MG RE SUPP
10.0000 mg | Freq: Once | RECTAL | Status: AC
  Administered 2024-03-09: 10 mg via RECTAL
  Filled 2024-03-09: qty 1

## 2024-03-09 NOTE — Progress Notes (Addendum)
 Patient ID: Martin [name], male   DOB: [birth year], 33 y.o.   MRN: [id number] Subjective: Martin [name]  is a 33 y.o. male with history of sickle cell disease, history of acute chest syndrome, multiple sickle cell crisis, multiple ED visits and hospital admission, history of PE currently on Xerelto, chronic hypoxic respiratory failure on home oxygen , HIV infection and generalized anxiety disorder who presented to the ED with major complaints of generalized body pain, lower back pain, and bilateral hip pain that is not consistent with his typical sickle cell pain crisis.  Ongoing pain of 8/10 in Patients right shoulder and arms. Patient states  I just don't feel well, I actually feel horrible. Encouraged patient that we are doing the best we can to help him. Blood cultures have no growth in 24 hrs, all labs returned back normal within patients baseline with no acute findings or worsening symptoms except for DG shoulder that showed progressive diffuse interstitial and airspace opacities within both lungs, concerning for acute chest syndrome, multifocal infection and/or pulmonary edema. Pharmacy consult completed for antibiotics. completed therapeutic blood exchange overnight.  Patient denies subjective fever, N/V/D, no headache, cough. No urinary symptoms.    Objective:  Vital signs in last 24 hours:  Vitals:   03/09/24 0725 03/09/24 0728 03/09/24 1220 03/09/24 1350  BP: 115/82   103/72  Pulse: 97   96  Resp: 13 14 13 14   Temp:  98.2 F (36.8 C)  98.2 F (36.8 C)  TempSrc:  Oral  Oral  SpO2: 95% 95% 94% 97%  Weight:      Height:        Intake/Output from previous day:   Intake/Output Summary (Last 24 hours) at 03/09/2024 1408 Last data filed at 03/09/2024 0730 Gross per 24 hour  Intake 1301.21 ml  Output 2500 ml  Net -1198.79 ml    Physical Exam: General: Alert, awake, oriented x3, in no acute distress.  HEENT: [place]/AT PEERL, EOMI Neck: Trachea midline,  no masses, no  thyromegal,y no JVD, no carotid bruit OROPHARYNX:  Moist, No exudate/ erythema/lesions.  Heart: Regular rate and rhythm, without murmurs, rubs, gallops, PMI non-displaced, no heaves or thrills on palpation.  Lungs: Improving crackles on the right lung fields.  No wheezing or rhonchi noted. No increased vocal fremitus resonant to percussion  Abdomen: Soft, nontender, nondistended, positive bowel sounds, no masses no hepatosplenomegaly noted..  Neuro: No focal neurological deficits noted cranial nerves II through XII grossly intact. DTRs 2+ bilaterally upper and lower extremities. Strength 5 out of 5 in bilateral upper and lower extremities. Musculoskeletal: Generalized body tenderness. Psychiatric: Patient alert and oriented x3, good insight and cognition, good recent to remote recall. Lymph node survey: No cervical axillary or inguinal lymphadenopathy noted.  Lab Results:  Basic Metabolic Panel:    Component Value Date/Time   NA 135 03/05/2024 0545   NA 140 01/12/2023 1607   K 4.2 03/05/2024 0545   CL 106 03/05/2024 0545   CO2 22 03/05/2024 0545   BUN 6 03/05/2024 0545   BUN 8 01/12/2023 1607   CREATININE 0.92 03/05/2024 0545   GLUCOSE 96 03/05/2024 0545   CALCIUM  7.7 (L) 03/05/2024 0545   CBC:    Component Value Date/Time   WBC 8.5 03/05/2024 0545   HGB 7.1 (L) 03/09/2024 0730   HGB 7.4 (L) 01/12/2023 1607   HCT 20.0 (L) 03/09/2024 0730   HCT 21.7 (L) 01/12/2023 1607   PLT 288 03/05/2024 0545   PLT 321 01/12/2023 1607  MCV 99.0 03/05/2024 0545   MCV 94 01/12/2023 1607   NEUTROABS 3.7 03/05/2024 0545   NEUTROABS 3.8 01/12/2023 1607   LYMPHSABS 3.5 03/05/2024 0545   LYMPHSABS 5.6 (H) 01/12/2023 1607   MONOABS 1.0 03/05/2024 0545   EOSABS 0.2 03/05/2024 0545   EOSABS 0.2 01/12/2023 1607   BASOSABS 0.0 03/05/2024 0545   BASOSABS 0.1 01/12/2023 1607    Recent Results (from the past 240 hours)  Culture, blood (Routine X 2) w Reflex to ID Panel     Status: None  (Preliminary result)   Collection Time: 03/08/24  9:59 AM   Specimen: BLOOD  Result Value Ref Range Status   Specimen Description   Final    BLOOD BLOOD RIGHT HAND Performed at [facility], [address] [place], [place] [address]    Special Requests   Final    BOTTLES DRAWN AEROBIC ONLY Blood Culture results may not be optimal due to an inadequate volume of blood received in culture bottles Performed at [facility], [address] [place], [place] [address]    Culture   Final    NO GROWTH < 24 HOURS Performed at [facility], [address] [place], [place] [address]    Report Status PENDING  Incomplete  Culture, blood (Routine X 2) w Reflex to ID Panel     Status: None (Preliminary result)   Collection Time: 03/08/24 10:06 AM   Specimen: BLOOD  Result Value Ref Range Status   Specimen Description   Final    BLOOD BLOOD RIGHT HAND AEROBIC BOTTLE ONLY Performed at [facility], [address], [place], [place] [address]    Special Requests   Final    BOTTLES DRAWN AEROBIC ONLY Blood Culture results may not be optimal due to an inadequate volume of blood received in culture bottles Performed at [facility], [address] [place], [place] [address]    Culture   Final    NO GROWTH < 24 HOURS Performed at [facility], [address], [place], [place] [address]    Report Status PENDING  Incomplete     Studies/Results: DG Shoulder 1V Right Result Date: 03/08/2024 EXAM: 1 VIEW(S) XRAY OF THE RIGHT SHOULDER 03/08/2024 11:10:11 AM COMPARISON: None available. CLINICAL HISTORY: Pain FINDINGS: BONES AND JOINTS: Glenohumeral joint is normally aligned. No acute fracture or dislocation. The [facility] joint is unremarkable in appearance. SOFT TISSUES: Right chest port noted. No abnormal calcifications. Progressive interstitial and airspace opacities within both lungs which may reflect acute chest  syndrome . IMPRESSION: 1. No acute fracture or dislocation. 2. Progressive diffuse interstitial and airspace opacities within both lungs, concerning for acute chest syndrome, multifocal infection and/or pulmonary edema. . Electronically signed by: Waddell Calk MD 03/08/2024 02:12 PM EST RP Workstation: [id number]     Medications: Scheduled Meds:  abacavir -dolutegravir -lamiVUDine   1 tablet Oral Daily   Chlorhexidine  Gluconate Cloth  6 each Topical Daily   dabigatran  150 mg Oral BID   diphenhydrAMINE   25 mg Oral Once   feeding supplement  237 mL Oral BID BM   folic acid   500 mcg Oral Daily   guaiFENesin   600 mg Oral BID   HYDROmorphone    Intravenous Q4H   hydroxyurea   2,000 mg Oral QHS   senna-docusate  1 tablet Oral BID   sodium chloride  flush  10-40 mL Intracatheter Q12H   Continuous Infusions:   PRN Meds:.oxyCODONE  **AND** acetaminophen , albuterol , cyclobenzaprine , diphenhydrAMINE , gabapentin ,  lip balm, naloxone  **AND** sodium chloride  flush, ondansetron  (ZOFRAN ) IV, polyethylene glycol, sodium chloride  flush  Consultants: Cardiologist [completed]  Procedures: Echocardiogram [completed]  Antibiotics: None  Assessment/Plan: Active Problems:   Generalized anxiety disorder   GERD (gastroesophageal reflux disease)   HIV (human immunodeficiency virus infection) (HCC)   History of pulmonary embolism   Acute on chronic anemia   Sickle cell pain crisis (HCC)   Right shoulder pain Sickle cell pain crisis: continue Dilaudid  PCA on current dose, IV fluids restarted for 24 hours to help  with hydration (completed). continue oral home pain medications as ordered. Monitor vitals very closely, Re-evaluate pain scale regularly, 2 L of Oxygen  by [place].  Acute chest syndrome: Right shoulder DG shows Progressive diffuse interstitial and airspace opacities within both lungs, concerning for acute chest syndrome, multifocal infection and/or pulmonary edema. Pharmacy consult completed for  Vancomycin  /cefepime .  Anemia of chronic disease: Monitor daily CBC hgb is within patients baseline. No clinical indication for blood transfusion. Completed exchange transfusion yesterday. 500 cc removed and replaced with 2 units of PRBCs. SIRS: Patient had low temperature, tachycardia, urinalysis completed, negative for UTI.  Chest x-ray shows further improvement in pulmonary edema. Blood cultures showing no growth in 24 hrs. Right shoulder pain: X-ray shows no acute fracture or dislocation, continue heating pad, continue pain medication as ordered.  Chronic pain syndrome: Continue chronic pain medications. HIV disease: Continue home regimen GERD: Continue PPIs History of PE: Continue monitoring.  We may get CTA if symptoms continue Generalized anxiety disorder: Continue other supportive care  Code Status: Full Code Family Communication: N/A Disposition Plan: Ready for discharge in the AM.  [name] Cover NP  If 7PM-7AM, please contact night-coverage.  03/09/2024, 2:08 PM  LOS: 5 days

## 2024-03-09 NOTE — Plan of Care (Signed)
  Problem: Activity: Goal: Risk for activity intolerance will decrease Outcome: Progressing   Problem: Pain Managment: Goal: General experience of comfort will improve and/or be controlled Outcome: Progressing   Problem: Safety: Goal: Ability to remain free from injury will improve Outcome: Progressing   Problem: Skin Integrity: Goal: Risk for impaired skin integrity will decrease Outcome: Progressing

## 2024-03-10 ENCOUNTER — Telehealth: Payer: Self-pay

## 2024-03-10 LAB — TYPE AND SCREEN
ABO/RH(D): O POS
Antibody Screen: NEGATIVE
Donor AG Type: NEGATIVE
Donor AG Type: NEGATIVE
Unit division: 0
Unit division: 0

## 2024-03-10 LAB — BPAM RBC
Blood Product Expiration Date: 202512202359
Blood Product Expiration Date: 202512202359
ISSUE DATE / TIME: 202511192255
ISSUE DATE / TIME: 202511200137
Unit Type and Rh: 5100
Unit Type and Rh: 5100

## 2024-03-10 MED ORDER — ONDANSETRON HCL 4 MG/2ML IJ SOLN
4.0000 mg | Freq: Once | INTRAMUSCULAR | Status: AC
  Administered 2024-03-10: 4 mg via INTRAVENOUS
  Filled 2024-03-10: qty 2

## 2024-03-10 NOTE — Plan of Care (Signed)
  Problem: Pain Managment: Goal: General experience of comfort will improve and/or be controlled Outcome: Progressing   Problem: Respiratory: Goal: Pulmonary complications will be avoided or minimized Outcome: Progressing Goal: Acute Chest Syndrome will be identified early to prevent complications Outcome: Progressing   Problem: Fluid Volume: Goal: Ability to maintain a balanced intake and output will improve Outcome: Progressing

## 2024-03-10 NOTE — Progress Notes (Cosign Needed)
 Patient ID: Martin [name], male   DOB: [birth year], 33 y.o.   MRN: [id number] Subjective: Martin [name]  is a 33 y.o. male with history of sickle cell disease, history of acute chest syndrome, multiple sickle cell crisis, multiple ED visits and hospital admission, history of PE currently on Xerelto, chronic hypoxic respiratory failure on home oxygen , HIV infection and generalized anxiety disorder who presented to the ED with major complaints of generalized body pain, lower back pain, and bilateral hip pain that is not consistent with his typical sickle cell pain crisis.  Patient is endorsing pain of 7/10 this morning,he is gradually improving,  Patient denies subjective fever, N/V/D, no headache, cough. No urinary symptoms.    Objective:  Vital signs in last 24 hours:  Vitals:   03/12/24 0537 03/12/24 0725 03/12/24 1008 03/12/24 1144  BP: 111/76  112/66   Pulse: 92  97   Resp: 16 14 16 18   Temp: 97.7 F (36.5 C)  99.1 F (37.3 C)   TempSrc: Oral  Oral   SpO2: 94% 94% 93% 94%  Weight:      Height:        Intake/Output from previous day:  No intake or output data in the 24 hours ending 03/13/24 1109   Physical Exam: General: Alert, awake, oriented x3, in no acute distress.  HEENT: [place]/AT PEERL, EOMI Neck: Trachea midline,  no masses, no thyromegal,y no JVD, no carotid bruit OROPHARYNX:  Moist, No exudate/ erythema/lesions.  Heart: Regular rate and rhythm, without murmurs, rubs, gallops, PMI non-displaced, no heaves or thrills on palpation.  Lungs: Improving crackles on the right lung fields.  No wheezing or rhonchi noted. No increased vocal fremitus resonant to percussion  Abdomen: Soft, nontender, nondistended, positive bowel sounds, no masses no hepatosplenomegaly noted..  Neuro: No focal neurological deficits noted cranial nerves II through XII grossly intact. DTRs 2+ bilaterally upper and lower extremities. Strength 5 out of 5 in bilateral upper and lower  extremities. Musculoskeletal: Generalized body tenderness. Psychiatric: Patient alert and oriented x3, good insight and cognition, good recent to remote recall. Lymph node survey: No cervical axillary or inguinal lymphadenopathy noted.  Lab Results:  Basic Metabolic Panel:    Component Value Date/Time   NA 138 03/12/2024 0509   NA 140 01/12/2023 1607   K 4.3 03/12/2024 0509   CL 105 03/12/2024 0509   CO2 28 03/12/2024 0509   BUN 18 03/12/2024 0509   BUN 8 01/12/2023 1607   CREATININE 1.05 03/12/2024 0509   GLUCOSE 108 (H) 03/12/2024 0509   CALCIUM  8.7 (L) 03/12/2024 0509   CBC:    Component Value Date/Time   WBC 7.3 03/12/2024 0509   HGB 6.8 (LL) 03/12/2024 0509   HGB 7.4 (L) 01/12/2023 1607   HCT 18.0 (L) 03/12/2024 0509   HCT 21.7 (L) 01/12/2023 1607   PLT 299 03/12/2024 0509   PLT 321 01/12/2023 1607   MCV 92.8 03/12/2024 0509   MCV 94 01/12/2023 1607   NEUTROABS 3.7 03/05/2024 0545   NEUTROABS 3.8 01/12/2023 1607   LYMPHSABS 3.5 03/05/2024 0545   LYMPHSABS 5.6 (H) 01/12/2023 1607   MONOABS 1.0 03/05/2024 0545   EOSABS 0.2 03/05/2024 0545   EOSABS 0.2 01/12/2023 1607   BASOSABS 0.0 03/05/2024 0545   BASOSABS 0.1 01/12/2023 1607    Recent Results (from the past 240 hours)  Culture, blood (Routine X 2) w Reflex to ID Panel     Status: None   Collection Time: 03/08/24  9:59 AM  Specimen: BLOOD  Result Value Ref Range Status   Specimen Description   Final    BLOOD BLOOD RIGHT HAND Performed at [facility], [address] [name], [place] [address]    Special Requests   Final    BOTTLES DRAWN AEROBIC ONLY Blood Culture results may not be optimal due to an inadequate volume of blood received in culture bottles Performed at [facility], [address], [place], [place] [address]    Culture   Final    NO GROWTH 5 DAYS Performed at [facility], [address], [place], [place] [address]    Report Status 03/13/2024  FINAL  Final  Culture, blood (Routine X 2) w Reflex to ID Panel     Status: None (Preliminary result)   Collection Time: 03/08/24 10:06 AM   Specimen: BLOOD  Result Value Ref Range Status   Specimen Description   Final    BLOOD BLOOD RIGHT HAND AEROBIC BOTTLE ONLY Performed at [facility], [address], [place], [place] [address]    Special Requests   Final    BOTTLES DRAWN AEROBIC ONLY Blood Culture results may not be optimal due to an inadequate volume of blood received in culture bottles Performed at [facility], [address] [place], [place] [address]    Culture  Setup Time   Final    GRAM POSITIVE RODS AEROBIC BOTTLE ONLY CORRECTED RESULTS CALLED TO: RBV NICK GLOGOVACPHARMD 03/11/2024 @ 0715 BY DD PREVIOUSLY REPORTED AS: GRAM NEGATIVE RODS RBV NICK GLOGOVACPHARMD 03/11/2024 @ 0715 BY DD    Culture   Final    CULTURE REINCUBATED FOR BETTER GROWTH Performed at [facility], [address], [place], [place] [address]    Report Status PENDING  Incomplete  Blood Culture ID Panel (Reflexed)     Status: None   Collection Time: 03/08/24 10:06 AM  Result Value Ref Range Status   Enterococcus faecalis NOT DETECTED NOT DETECTED Final   Enterococcus Faecium NOT DETECTED NOT DETECTED Final   Listeria monocytogenes NOT DETECTED NOT DETECTED Final   Staphylococcus species NOT DETECTED NOT DETECTED Final   Staphylococcus aureus (BCID) NOT DETECTED NOT DETECTED Final   Staphylococcus epidermidis NOT DETECTED NOT DETECTED Final   Staphylococcus lugdunensis NOT DETECTED NOT DETECTED Final   Streptococcus species NOT DETECTED NOT DETECTED Final   Streptococcus agalactiae NOT DETECTED NOT DETECTED Final   Streptococcus pneumoniae NOT DETECTED NOT DETECTED Final   Streptococcus pyogenes NOT DETECTED NOT DETECTED Final   A.calcoaceticus-baumannii NOT DETECTED NOT DETECTED Final   Bacteroides fragilis NOT DETECTED NOT DETECTED Final    Enterobacterales NOT DETECTED NOT DETECTED Final   Enterobacter cloacae complex NOT DETECTED NOT DETECTED Final   Escherichia coli NOT DETECTED NOT DETECTED Final   Klebsiella aerogenes NOT DETECTED NOT DETECTED Final   Klebsiella oxytoca NOT DETECTED NOT DETECTED Final   Klebsiella pneumoniae NOT DETECTED NOT DETECTED Final   Proteus species NOT DETECTED NOT DETECTED Final   Salmonella species NOT DETECTED NOT DETECTED Final   Serratia marcescens NOT DETECTED NOT DETECTED Final   Haemophilus influenzae NOT DETECTED NOT DETECTED Final   Neisseria meningitidis NOT DETECTED NOT DETECTED Final   Pseudomonas aeruginosa NOT DETECTED NOT DETECTED Final   Stenotrophomonas maltophilia NOT DETECTED NOT DETECTED Final   Candida albicans NOT DETECTED NOT DETECTED Final   Candida auris NOT DETECTED NOT DETECTED Final   Candida glabrata NOT DETECTED NOT DETECTED Final  Candida krusei NOT DETECTED NOT DETECTED Final   Candida parapsilosis NOT DETECTED NOT DETECTED Final   Candida tropicalis NOT DETECTED NOT DETECTED Final   Cryptococcus neoformans/gattii NOT DETECTED NOT DETECTED Final    Comment: Performed at [facility], [address] [place], [place] [address]     Studies/Results: No results found.    Medications: Scheduled Meds:   Continuous Infusions:    PRN Meds:.  Consultants: None  Procedures: Exchange blood transfusion (completed)   Antibiotics: Cefepime     Assessment/Plan: Active Problems:   Generalized anxiety disorder   GERD (gastroesophageal reflux disease)   HIV (human immunodeficiency virus infection) (HCC)   History of pulmonary embolism   Acute on chronic anemia   Sickle cell pain crisis (HCC)   Right shoulder pain Sickle cell pain crisis: Pain is gradually improving, continue Dilaudid  PCA on current dose, continue oral home pain medications as ordered. Monitor vitals very closely, Re-evaluate pain scale regularly, 2 L of Oxygen  by [place].  Acute  chest syndrome: Right shoulder DG shows Progressive diffuse interstitial and airspace opacities within both lungs, concerning for acute chest syndrome, multifocal infection and/or pulmonary edema. Continue cefepime  as ordered.  Anemia of chronic disease: Monitor daily CBC hgb is within patients baseline. No clinical indication for blood transfusion. Completed exchange transfusion. 500 cc removed and replaced with 2 units of PRBCs.  SIRS: Patient had low temperature, tachycardia, urinalysis completed, negative for UTI.  Chest x-ray shows further improvement in pulmonary edema. Blood cultures showing no growth.  Right shoulder pain: X-ray shows no acute fracture or dislocation, continue heating pad, continue pain medication as ordered.  Chronic pain syndrome: Continue chronic pain medications. HIV disease: Continue home regimen GERD: Continue PPIs History of PE: Continue monitoring. We may get CTA if symptoms continue Generalized anxiety disorder: Continue other supportive care  Code Status: Full Code Family Communication: N/A Disposition Plan: Ready for discharge in the AM.  [name] Cover NP  If 7PM-7AM, please contact night-coverage.  03/13/2024, 11:09 AM  LOS: 8 days

## 2024-03-10 NOTE — Plan of Care (Signed)
  Problem: Clinical Measurements: Goal: Cardiovascular complication will be avoided Outcome: Progressing   Problem: Education: Goal: Knowledge of General Education information will improve Description: Including pain rating scale, medication(s)/side effects and non-pharmacologic comfort measures Outcome: Not Progressing   Problem: Health Behavior/Discharge Planning: Goal: Ability to manage health-related needs will improve Outcome: Not Progressing   Problem: Clinical Measurements: Goal: Ability to maintain clinical measurements within normal limits will improve Outcome: Not Progressing Goal: Will remain free from infection Outcome: Not Progressing Goal: Diagnostic test results will improve Outcome: Not Progressing Goal: Respiratory complications will improve Outcome: Not Progressing

## 2024-03-10 NOTE — Progress Notes (Unsigned)
 Complex Care Management Care Guide Note  03/10/2024 Name: Martin [name] MRN: [id number] DOB: 1990-09-01  Martin [name] is a 33 y.o. year old male who is a primary care patient of [name], [name], [name] and is actively engaged with the care management team. I reached out to [facility] Martin [name] by phone today to assist with re-scheduling  with the BSW.  Follow up plan: Unsuccessful telephone outreach attempt made. A HIPAA compliant phone message was left for the patient providing contact information and requesting a return call.  Leotis Rase [facility], [facility] Guide  Direct Dial: [phone]  Fax [phone]

## 2024-03-10 NOTE — Plan of Care (Signed)
  Problem: Pain Managment: Goal: General experience of comfort will improve and/or be controlled Outcome: Progressing   Problem: Safety: Goal: Ability to remain free from injury will improve Outcome: Progressing   Problem: Skin Integrity: Goal: Risk for impaired skin integrity will decrease Outcome: Progressing

## 2024-03-11 DIAGNOSIS — D5700 Hb-SS disease with crisis, unspecified: Secondary | ICD-10-CM | POA: Diagnosis not present

## 2024-03-11 LAB — BLOOD CULTURE ID PANEL (REFLEXED) - BCID2

## 2024-03-11 LAB — CBC
HCT: 19.6 % — ABNORMAL LOW (ref 39.0–52.0)
Hemoglobin: 6.8 g/dL — CL (ref 13.0–17.0)
MCH: 32.2 pg (ref 26.0–34.0)
MCHC: 34.7 g/dL (ref 30.0–36.0)
MCV: 92.9 fL (ref 80.0–100.0)
Platelets: 299 K/uL (ref 150–400)
RBC: 2.11 MIL/uL — ABNORMAL LOW (ref 4.22–5.81)
RDW: 23.5 % — ABNORMAL HIGH (ref 11.5–15.5)
WBC: 9.1 K/uL (ref 4.0–10.5)
nRBC: 55.4 % — ABNORMAL HIGH (ref 0.0–0.2)

## 2024-03-11 MED ORDER — CALCIUM CARBONATE ANTACID 500 MG PO CHEW
400.0000 mg | CHEWABLE_TABLET | Freq: Three times a day (TID) | ORAL | Status: DC | PRN
Start: 2024-03-11 — End: 2024-03-12
  Administered 2024-03-11: 400 mg via ORAL
  Filled 2024-03-11: qty 2

## 2024-03-11 NOTE — Progress Notes (Signed)
 Patient ID: Martin [name], male   DOB: 05-Jul-1990, 33 y.o.   MRN: [id number] Subjective: Martin [name]  is a 33 y.o. male with history of sickle cell disease, history of acute chest syndrome, multiple sickle cell crisis, multiple ED visits and hospital admission, history of PE currently on Xerelto, chronic hypoxic respiratory failure on home oxygen , HIV infection and generalized anxiety disorder who presented to the ED with major complaints of generalized body pain, admitted for sickle cell pain crisis.  Patient claims he is much better today but would like to stay 1 more night in the hospital to fully recover.  He has no new complaint today.  He denies any fever, nausea, vomiting or diarrhea.  No headache or visual disturbance.  No urinary symptoms.   Objective:  Vital signs in last 24 hours:  Vitals:   03/11/24 1200 03/11/24 1211 03/11/24 1427 03/11/24 1439  BP:   114/77   Pulse:   88   Resp: 20 20 16 10   Temp:   99 F (37.2 C)   TempSrc:   Oral   SpO2: 94%  96%   Weight:      Height:        Intake/Output from previous day:   Intake/Output Summary (Last 24 hours) at 03/11/2024 1528 Last data filed at 03/11/2024 1309 Gross per 24 hour  Intake 208.97 ml  Output 2000 ml  Net -1791.03 ml    Physical Exam: General: Alert, awake, oriented x3, in no acute distress.  Icteric.  Mildly pale. HEENT: [place]/AT PEERL, EOMI Neck: Trachea midline,  no masses, no thyromegal,y no JVD, no carotid bruit OROPHARYNX:  Moist, No exudate/ erythema/lesions.  Heart: Regular rate and rhythm, without murmurs, rubs, gallops, PMI non-displaced, no heaves or thrills on palpation.  Lungs: Improving crackles on the right lung fields.  No wheezing or rhonchi noted. No increased vocal fremitus resonant to percussion  Abdomen: Soft, nontender, nondistended, positive bowel sounds, no masses no hepatosplenomegaly noted..  Neuro: No focal neurological deficits noted cranial nerves II through XII grossly  intact. DTRs 2+ bilaterally upper and lower extremities. Strength 5 out of 5 in bilateral upper and lower extremities. Musculoskeletal: No warm swelling or erythema around joints, no spinal tenderness noted. Psychiatric: Patient alert and oriented x3, good insight and cognition, good recent to remote recall. Lymph node survey: No cervical axillary or inguinal lymphadenopathy noted.  Lab Results:  Basic Metabolic Panel:    Component Value Date/Time   NA 135 03/05/2024 0545   NA 140 01/12/2023 1607   K 4.2 03/05/2024 0545   CL 106 03/05/2024 0545   CO2 22 03/05/2024 0545   BUN 6 03/05/2024 0545   BUN 8 01/12/2023 1607   CREATININE 0.92 03/05/2024 0545   GLUCOSE 96 03/05/2024 0545   CALCIUM  7.7 (L) 03/05/2024 0545   CBC:    Component Value Date/Time   WBC 9.1 03/11/2024 0602   HGB 6.8 (LL) 03/11/2024 0602   HGB 7.4 (L) 01/12/2023 1607   HCT 19.6 (L) 03/11/2024 0602   HCT 21.7 (L) 01/12/2023 1607   PLT 299 03/11/2024 0602   PLT 321 01/12/2023 1607   MCV 92.9 03/11/2024 0602   MCV 94 01/12/2023 1607   NEUTROABS 3.7 03/05/2024 0545   NEUTROABS 3.8 01/12/2023 1607   LYMPHSABS 3.5 03/05/2024 0545   LYMPHSABS 5.6 (H) 01/12/2023 1607   MONOABS 1.0 03/05/2024 0545   EOSABS 0.2 03/05/2024 0545   EOSABS 0.2 01/12/2023 1607   BASOSABS 0.0 03/05/2024 0545  BASOSABS 0.1 01/12/2023 1607    Recent Results (from the past 240 hours)  Culture, blood (Routine X 2) w Reflex to ID Panel     Status: None (Preliminary result)   Collection Time: 03/08/24  9:59 AM   Specimen: BLOOD  Result Value Ref Range Status   Specimen Description   Final    BLOOD BLOOD RIGHT HAND Performed at [facility], [address], [place], [place] [address]    Special Requests   Final    BOTTLES DRAWN AEROBIC ONLY Blood Culture results may not be optimal due to an inadequate volume of blood received in culture bottles Performed at [facility], [address]  [place], [place] [address]    Culture   Final    NO GROWTH 3 DAYS Performed at [facility], [address], [place], [place] [address]    Report Status PENDING  Incomplete  Culture, blood (Routine X 2) w Reflex to ID Panel     Status: None (Preliminary result)   Collection Time: 03/08/24 10:06 AM   Specimen: BLOOD  Result Value Ref Range Status   Specimen Description   Final    BLOOD BLOOD RIGHT HAND AEROBIC BOTTLE ONLY Performed at [facility], [address], [place], [place] [address]    Special Requests   Final    BOTTLES DRAWN AEROBIC ONLY Blood Culture results may not be optimal due to an inadequate volume of blood received in culture bottles Performed at [facility], [address] [place], [place] [address]    Culture  Setup Time   Final    GRAM POSITIVE RODS AEROBIC BOTTLE ONLY CORRECTED RESULTS CALLED TO: RBV NICK GLOGOVACPHARMD 03/11/2024 @ 0715 BY DD PREVIOUSLY REPORTED AS: GRAM NEGATIVE RODS RBV NICK GLOGOVACPHARMD 03/11/2024 @ 0715 BY DD    Culture   Final    CULTURE REINCUBATED FOR BETTER GROWTH Performed at [facility], [address] [place], [place] [address]    Report Status PENDING  Incomplete  Blood Culture ID Panel (Reflexed)     Status: None   Collection Time: 03/08/24 10:06 AM  Result Value Ref Range Status   Enterococcus faecalis NOT DETECTED NOT DETECTED Final   Enterococcus Faecium NOT DETECTED NOT DETECTED Final   Listeria monocytogenes NOT DETECTED NOT DETECTED Final   Staphylococcus species NOT DETECTED NOT DETECTED Final   Staphylococcus aureus (BCID) NOT DETECTED NOT DETECTED Final   Staphylococcus epidermidis NOT DETECTED NOT DETECTED Final   Staphylococcus lugdunensis NOT DETECTED NOT DETECTED Final   Streptococcus species NOT DETECTED NOT DETECTED Final   Streptococcus agalactiae NOT DETECTED NOT DETECTED Final   Streptococcus pneumoniae NOT DETECTED NOT DETECTED Final   Streptococcus  pyogenes NOT DETECTED NOT DETECTED Final   A.calcoaceticus-baumannii NOT DETECTED NOT DETECTED Final   Bacteroides fragilis NOT DETECTED NOT DETECTED Final   Enterobacterales NOT DETECTED NOT DETECTED Final   Enterobacter cloacae complex NOT DETECTED NOT DETECTED Final   Escherichia coli NOT DETECTED NOT DETECTED Final   Klebsiella aerogenes NOT DETECTED NOT DETECTED Final   Klebsiella oxytoca NOT DETECTED NOT DETECTED Final   Klebsiella pneumoniae NOT DETECTED NOT DETECTED Final   Proteus species NOT DETECTED NOT DETECTED Final   Salmonella species NOT DETECTED NOT DETECTED Final   Serratia marcescens NOT DETECTED NOT DETECTED Final   Haemophilus influenzae NOT DETECTED NOT DETECTED Final   Neisseria meningitidis NOT DETECTED NOT DETECTED Final   Pseudomonas aeruginosa NOT  DETECTED NOT DETECTED Final   Stenotrophomonas maltophilia NOT DETECTED NOT DETECTED Final   Candida albicans NOT DETECTED NOT DETECTED Final   Candida auris NOT DETECTED NOT DETECTED Final   Candida glabrata NOT DETECTED NOT DETECTED Final   Candida krusei NOT DETECTED NOT DETECTED Final   Candida parapsilosis NOT DETECTED NOT DETECTED Final   Candida tropicalis NOT DETECTED NOT DETECTED Final   Cryptococcus neoformans/gattii NOT DETECTED NOT DETECTED Final    Comment: Performed at [facility], [address], [place], [place] [address]    Studies/Results: No results found.  Medications: Scheduled Meds:  abacavir -dolutegravir -lamiVUDine   1 tablet Oral Daily   dabigatran   150 mg Oral BID   diphenhydrAMINE   25 mg Oral Once   feeding supplement  237 mL Oral BID BM   folic acid   500 mcg Oral Daily   guaiFENesin   600 mg Oral BID   HYDROmorphone    Intravenous Q4H   hydroxyurea   2,000 mg Oral QHS   senna-docusate  1 tablet Oral BID   sodium chloride  flush  10-40 mL Intracatheter Q12H   Continuous Infusions:  ceFEPime  (MAXIPIME ) IV 2 g (03/11/24 0816)   PRN Meds:.oxyCODONE  **AND** acetaminophen ,  albuterol , calcium  carbonate, cyclobenzaprine , diphenhydrAMINE , gabapentin , lip balm, naloxone  **AND** sodium chloride  flush, ondansetron  (ZOFRAN ) IV, polyethylene glycol, sodium chloride  flush  Consultants: None  Procedures: None  Antibiotics: IV cefepime  for possible acute chest syndrome  Assessment/Plan: Active Problems:   Generalized anxiety disorder   GERD (gastroesophageal reflux disease)   HIV (human immunodeficiency virus infection) (HCC)   History of pulmonary embolism   Acute on chronic anemia   Sickle cell pain crisis (HCC)   Right shoulder pain  Hb Sickle Cell Disease with Pain crisis: Pain is improving patient will request 1 more night in the hospital.  Continue IVF at [facility].  Continue weight based Dilaudid  PCA, continue oral home pain medications as ordered. Monitor vitals very closely, Re-evaluate pain scale regularly, 2 L of Oxygen  by [place]. Patient encouraged to ambulate on the hallway today.  Acute Chest Syndrome: Patient is on IV cefepime , will continue. Continue pain management. Continue oxygen  supplement. History of pulmonary embolism: Patient is on anticoagulation.  Will continue. Pulmonary hypertension: Patient missed cardiologist appointment because of this admission.  Patient has been encouraged to follow-up with cardiologist as soon as he is discharged from the hospital.  Patient verbalized understanding and will call to reschedule. Anemia of Chronic Disease: Patient is status post mini exchange transfusion with 500 cc removed and replaced with 2 units of packed red blood cells.  Hemoglobin is at 6.8 today which is close to his baseline of 7.  Will not transfuse except hemoglobin drops below 6.  Continue to monitor very closely. Chronic pain Syndrome: Continue oral home pain medications as ordered. HIV infection: Asymptomatic. Clinically stable. Continue home medication. GERD: Continue PPIs. Chronic hypoxic respiratory failure: Patient is doing much better with his  oxygen  saturation.  Continue incentive spirometry. Patient encouraged to ambulate. Generalized anxiety disorder: Clinically stable. Patient denies any suicidal ideations or thoughts.  Will continue his home medications.  Patient counseled extensively.  Code Status: Full Code Family Communication: N/A Disposition Plan: For possible discharge tomorrow morning.  Kalyna Paolella  If 7PM-7AM, please contact night-coverage.  03/11/2024, 3:28 PM  LOS: 7 days

## 2024-03-11 NOTE — Progress Notes (Signed)
 PHARMACY - PHYSICIAN COMMUNICATION CRITICAL VALUE ALERT - BLOOD CULTURE IDENTIFICATION (BCID)  Martin [name] is an 33 y.o. male who presented to [facility] on 03/03/2024 with a chief complaint of  Chief Complaint  Patient presents with   Sickle Cell Pain Crisis     Assessment:  1/2 bottles are actually GPR, not GNR as previously reported  Name of physician (or Provider) Contacted: Dr. Jegede  Current antibiotics: cefepime   Changes to prescribed antibiotics recommended:  - Keep cefepime  for now pending further evaluation by MD  Results for orders placed or performed during the hospital encounter of 03/03/24  Blood Culture ID Panel (Reflexed) (Collected: 03/08/2024 10:06 AM)  Result Value Ref Range   Enterococcus faecalis NOT DETECTED NOT DETECTED   Enterococcus Faecium NOT DETECTED NOT DETECTED   Listeria monocytogenes NOT DETECTED NOT DETECTED   Staphylococcus species NOT DETECTED NOT DETECTED   Staphylococcus aureus (BCID) NOT DETECTED NOT DETECTED   Staphylococcus epidermidis NOT DETECTED NOT DETECTED   Staphylococcus lugdunensis NOT DETECTED NOT DETECTED   Streptococcus species NOT DETECTED NOT DETECTED   Streptococcus agalactiae NOT DETECTED NOT DETECTED   Streptococcus pneumoniae NOT DETECTED NOT DETECTED   Streptococcus pyogenes NOT DETECTED NOT DETECTED   A.calcoaceticus-baumannii NOT DETECTED NOT DETECTED   Bacteroides fragilis NOT DETECTED NOT DETECTED   Enterobacterales NOT DETECTED NOT DETECTED   Enterobacter cloacae complex NOT DETECTED NOT DETECTED   Escherichia coli NOT DETECTED NOT DETECTED   Klebsiella aerogenes NOT DETECTED NOT DETECTED   Klebsiella oxytoca NOT DETECTED NOT DETECTED   Klebsiella pneumoniae NOT DETECTED NOT DETECTED   Proteus species NOT DETECTED NOT DETECTED   Salmonella species NOT DETECTED NOT DETECTED   Serratia marcescens NOT DETECTED NOT DETECTED   Haemophilus influenzae NOT DETECTED NOT DETECTED   Neisseria meningitidis NOT  DETECTED NOT DETECTED   Pseudomonas aeruginosa NOT DETECTED NOT DETECTED   Stenotrophomonas maltophilia NOT DETECTED NOT DETECTED   Candida albicans NOT DETECTED NOT DETECTED   Candida auris NOT DETECTED NOT DETECTED   Candida glabrata NOT DETECTED NOT DETECTED   Candida krusei NOT DETECTED NOT DETECTED   Candida parapsilosis NOT DETECTED NOT DETECTED   Candida tropicalis NOT DETECTED NOT DETECTED   Cryptococcus neoformans/gattii NOT DETECTED NOT DETECTED     Dolphus Roller, PharmD, BCPS 03/11/2024 8:52 AM

## 2024-03-11 NOTE — Plan of Care (Signed)
  Problem: Clinical Measurements: Goal: Will remain free from infection Outcome: Progressing Goal: Cardiovascular complication will be avoided Outcome: Progressing   Problem: Activity: Goal: Risk for activity intolerance will decrease Outcome: Progressing   Problem: Education: Goal: Knowledge of General Education information will improve Description: Including pain rating scale, medication(s)/side effects and non-pharmacologic comfort measures Outcome: Not Progressing   Problem: Health Behavior/Discharge Planning: Goal: Ability to manage health-related needs will improve Outcome: Not Progressing   Problem: Clinical Measurements: Goal: Ability to maintain clinical measurements within normal limits will improve Outcome: Not Progressing Goal: Diagnostic test results will improve Outcome: Not Progressing Goal: Respiratory complications will improve Outcome: Not Progressing

## 2024-03-11 NOTE — Progress Notes (Signed)
 This RN educated patient about keeping EtCO2 nasal canula in nose. Patient has been requiring 6L of oxygen . Patient states he feels fine right now and doesn't need it. Patient also continues to take off pulse oximetry. Will continue to educate patient.

## 2024-03-12 ENCOUNTER — Other Ambulatory Visit (HOSPITAL_COMMUNITY): Payer: Self-pay

## 2024-03-12 ENCOUNTER — Other Ambulatory Visit: Payer: Self-pay

## 2024-03-12 DIAGNOSIS — D5700 Hb-SS disease with crisis, unspecified: Secondary | ICD-10-CM | POA: Diagnosis not present

## 2024-03-12 LAB — CBC
HCT: 18 % — ABNORMAL LOW (ref 39.0–52.0)
Hemoglobin: 6.8 g/dL — CL (ref 13.0–17.0)
MCH: 35.1 pg — ABNORMAL HIGH (ref 26.0–34.0)
MCHC: 37.8 g/dL — ABNORMAL HIGH (ref 30.0–36.0)
MCV: 92.8 fL (ref 80.0–100.0)
Platelets: 299 K/uL (ref 150–400)
RBC: 1.94 MIL/uL — ABNORMAL LOW (ref 4.22–5.81)
RDW: 21.9 % — ABNORMAL HIGH (ref 11.5–15.5)
WBC: 7.3 K/uL (ref 4.0–10.5)
nRBC: 54.9 % — ABNORMAL HIGH (ref 0.0–0.2)

## 2024-03-12 LAB — COMPREHENSIVE METABOLIC PANEL WITH GFR
ALT: 197 U/L — ABNORMAL HIGH (ref 0–44)
AST: 72 U/L — ABNORMAL HIGH (ref 15–41)
Albumin: 3.5 g/dL (ref 3.5–5.0)
Alkaline Phosphatase: 69 U/L (ref 38–126)
Anion gap: 5 (ref 5–15)
BUN: 18 mg/dL (ref 6–20)
CO2: 28 mmol/L (ref 22–32)
Calcium: 8.7 mg/dL — ABNORMAL LOW (ref 8.9–10.3)
Chloride: 105 mmol/L (ref 98–111)
Creatinine, Ser: 1.05 mg/dL (ref 0.61–1.24)
GFR, Estimated: >60 mL/min (ref >60)
Glucose, Bld: 108 mg/dL — ABNORMAL HIGH (ref 70–99)
Potassium: 4.3 mmol/L (ref 3.5–5.1)
Sodium: 138 mmol/L (ref 135–145)
Total Bilirubin: 6 mg/dL — ABNORMAL HIGH (ref 0.0–1.2)
Total Protein: 7.9 g/dL (ref 6.5–8.1)

## 2024-03-12 LAB — PREPARE RBC (CROSSMATCH)

## 2024-03-12 MED ORDER — HEPARIN SOD (PORK) LOCK FLUSH 100 UNIT/ML IV SOLN: 500.0000 [IU] | Freq: Once | INTRAVENOUS | Status: AC

## 2024-03-12 MED ORDER — SODIUM CHLORIDE 0.9% IV SOLUTION: Freq: Once | INTRAVENOUS | Status: DC

## 2024-03-12 MED ORDER — AMOXICILLIN-POT CLAVULANATE 875-125 MG PO TABS
1.0000 | ORAL_TABLET | Freq: Two times a day (BID) | ORAL | 0 refills | Status: DC
  Filled 2024-03-12: qty 10, 5d supply, fill #0

## 2024-03-12 MED ORDER — GUAIFENESIN-DM 100-10 MG/5ML PO SYRP
10.0000 mL | ORAL_SOLUTION | ORAL | 0 refills | Status: AC | PRN
  Filled 2024-03-12: qty 480, 8d supply, fill #0

## 2024-03-12 MED ORDER — BISACODYL 10 MG RE SUPP
10.0000 mg | Freq: Once | RECTAL | Status: AC
  Administered 2024-03-12: 10 mg via RECTAL
  Filled 2024-03-12: qty 1

## 2024-03-12 MED ORDER — HEPARIN SOD (PORK) LOCK FLUSH 100 UNIT/ML IV SOLN
INTRAVENOUS | Status: AC
  Administered 2024-03-12: 500 [IU] via INTRAVENOUS
  Filled 2024-03-12: qty 5

## 2024-03-12 NOTE — Progress Notes (Signed)
 Meds to bed delivered to pt, nurse aware

## 2024-03-12 NOTE — Discharge Summary (Addendum)
 Physician Discharge Summary  [name]:[id number] DOB: 1991/02/06 DOA: 03/03/2024  PCP: [name], [name], [name]  Admit date: 03/03/2024  Discharge date: 03/12/2024  Discharge Diagnoses:  Active Problems:   Generalized anxiety disorder   GERD (gastroesophageal reflux disease)   HIV (human immunodeficiency virus infection) (HCC)   History of pulmonary embolism   Acute on chronic anemia   Sickle cell pain crisis (HCC)   Right shoulder pain  Discharge Condition: Stable  Disposition:   Follow-up Information     [name], [name], [name]. Schedule an appointment as soon as possible for a visit in 3 day(s).   Specialty: Nurse Practitioner Why: For repeat CBC and CMP. May need blood transfusion if Hgb drops below 6.0. Contact information: [address] [place] [address] [phone]                Pt is discharged home in good condition and is to follow up with [name], [name], [name] this week to have labs evaluated. [name] is instructed to increase activity slowly and balance with rest for the next few days, and use prescribed medication to complete treatment of pain  Diet: Regular Wt Readings from Last 3 Encounters:  03/05/24 63.5 kg  02/29/24 64.8 kg  02/17/24 64.8 kg    History of present illness:  Martin [name] is a 33 y.o. male with medical history significant of sickle cell disease, HIV disease, chronic heart failure diastolic in nature, anxiety disorder, anemia of chronic disease, chronic pain syndrome, who presented to the ER today with complaint of pain consistent with his typical sickle cell crisis.  Pain is in the lower back.  Also his hips bilaterally.  His symptoms have been going on for several days.  He tried all his home medications but no relief.  Was seen in the ER and evaluated.  Patient was also seen at the day hospital 4 days ago and treated for same.  In the ER he received up to 6 mg of IV Dilaudid   pushes but no relief.  Patient being admitted to the medical service for treatment of his sickle cell crisis.   Hospital Course:  Patient was admitted for sickle cell pain crisis and managed appropriately with IVF, IV Dilaudid  via PCA and IV Toradol , as well as other adjunct therapies per sickle cell pain management protocols. Patient's hemoglobin dropped below baseline for which he received one unit of PRBC during this admission. Patient developed low-grade fever and a right shoulder x-ray which was ordered because of increasing pain showed no acute fracture or dislocation but progressive diffuse interstitial and airspace opacities within both lungs concerning for acute chest syndrome, multifocal infection and/or pulmonary edema.  Blood cultures and urinalysis were completed.  Urinalysis was negative for UTI.  Patient was then started on IV cefepime  and vancomycin  initially which was later narrowed to IV cefepime  alone. Patient's symptoms slowly improved. As at today patient is tolerating p.o. intake with no significant restrictions and ambulating well with no significant pain. Patient's hemoglobin slightly dropped from 7.0 posttransfusion to 6.8 but not at threshold for further transfusion. Patient has been instructed to report to the clinic in 3 days for repeat H&H and for possible transfusion if hemoglobin drops below 6.0.  His other comorbidities were managed appropriately with his home medications, including history of pulmonary embolism on anticoagulation, generalized anxiety disorder, HIV disease and GERD.  Patient was hemodynamically stable throughout his admission.  He requested suppository or enema  multiple times for constipation.  Today is day 9 on admission, patient has remained afebrile for more than 48 hours, has completed 3 days of IV cefepime  for possible acute chest syndrome, patient's general symptoms are much improved.  He will be discharged on oral Augmentin  for 5 days to complete antibiotics  course.  Will also add cough expectorants Robitussin to help with cough. He has no new symptoms and no other clinical indication for continued admission at this time.  Patient will follow-up with her PCP in 3 days as scheduled for repeat CBC and CMP.  Plan is to continue his home medications including anticoagulations and pain medications.  Patient verbalized understanding. Patient was therefore discharged home today in a hemodynamically stable condition.   [name] will follow-up with PCP within 1 week of this discharge. [name] was counseled extensively about nonpharmacologic means of pain management, patient verbalized understanding and was appreciative of  the care received during this admission.   We discussed the need for good hydration, monitoring of hydration status, avoidance of heat, cold, stress, and infection triggers. We discussed the need to be adherent with taking Hydrea  and other home medications. Patient was reminded of the need to seek medical attention immediately if any symptom of bleeding, anemia, or infection occurs.  Discharge Exam: Vitals:   03/12/24 1008 03/12/24 1144  BP: 112/66   Pulse: 97   Resp: 16 18  Temp: 99.1 F (37.3 C)   SpO2: 93% 94%   Vitals:   03/12/24 0537 03/12/24 0725 03/12/24 1008 03/12/24 1144  BP: 111/76  112/66   Pulse: 92  97   Resp: 16 14 16 18   Temp: 97.7 F (36.5 C)  99.1 F (37.3 C)   TempSrc: Oral  Oral   SpO2: 94% 94% 93% 94%  Weight:      Height:        General appearance : Awake, alert, not in any distress. Speech Clear. Not toxic looking HEENT: Atraumatic and Normocephalic, pupils equally reactive to light and accomodation Neck: Supple, no JVD. No cervical lymphadenopathy.  Chest: Good air entry bilaterally, no added sounds  CVS: S1 S2 regular, no murmurs.  Abdomen: Bowel sounds present, Non tender and not distended with no gaurding, rigidity or rebound. Extremities: B/L Lower Ext shows no edema, both legs are warm to  touch Neurology: Awake alert, and oriented X 3, CN II-XII intact, Non focal Skin: No Rash  Discharge Instructions  Discharge Instructions     Diet - low sodium heart healthy   Complete by: As directed    Increase activity slowly   Complete by: As directed       Allergies as of 03/12/2024       Reactions   Ketorolac  Tromethamine  Swelling, Other (See Comments)   Patient reports facial edema and left arm edema after administration.   Tape Rash, Other (See Comments)   PLEASE DO NOT USE THE CLEAR, THICK, PLASTIC TAPE- only paper tape is tolerated    Wound Dressing Adhesive Rash   Bactoshield Chg [chlorhexidine  Gluconate] Other (See Comments)   Per patient it makes me breakout        Medication List     TAKE these medications    albuterol  108 (90 Base) MCG/ACT inhaler Commonly known as: VENTOLIN  HFA Inhale 2 puffs into the lungs every 6 (six) hours as needed for wheezing or shortness of breath.   cyclobenzaprine  5 MG tablet Commonly known as: FLEXERIL  Take 1 tablet (5 mg total) by mouth 2 (two)  times daily as needed for muscle spasms.   dabigatran  150 MG Caps capsule Commonly known as: Pradaxa  Take 1 capsule (150 mg total) by mouth 2 (two) times daily.   dabigatran  150 MG Caps capsule Commonly known as: PRADAXA  Take 1 capsule (150 mg total) by mouth every 12 (twelve) hours.   Deferasirox  360 MG Tabs Commonly known as: Jadenu  Take 2 tablets (720 mg total) by mouth daily.   diphenhydrAMINE  25 MG tablet Commonly known as: BENADRYL  Take 1 tablet (25 mg total) by mouth every 4 (four) hours as needed for itching. What changed: when to take this   folic acid  400 MCG tablet Commonly known as: FOLVITE  Take 1 tablet (400 mcg total) by mouth daily.   gabapentin  300 MG capsule Commonly known as: NEURONTIN  Take 1 capsule (300 mg total) by mouth at bedtime as needed (nerve pain).   guaiFENesin  600 MG 12 hr tablet Commonly known as: Mucinex  Take 1 tablet (600 mg  total) by mouth 2 (two) times daily.   hydroxyurea  500 MG capsule Commonly known as: HYDREA  Take 4 capsules (2,000 mg total) by mouth at bedtime.   Narcan  4 MG/0.1ML Liqd nasal spray kit Generic drug: naloxone  Place 1 spray into the nose as needed (respiratory distress / opioid overdose).   oxyCODONE -acetaminophen  10-325 MG tablet Commonly known as: PERCOCET Take 1 tablet by mouth every 4 (four) hours as needed for pain. What changed: when to take this   pantoprazole  40 MG tablet Commonly known as: PROTONIX  Take 1 tablet (40 mg total) by mouth daily as needed (acid reflux).   senna-docusate 8.6-50 MG tablet Commonly known as: Senokot-S Take 2 tablets by mouth at bedtime.   Triumeq  600-50-300 MG tablet Generic drug: abacavir -dolutegravir -lamiVUDine  Take 1 tablet by mouth daily. What changed: when to take this   Vitamin D3 125 MCG (5000 UT) Caps Take 5,000 Units by mouth every Tuesday.        The results of significant diagnostics from this hospitalization (including imaging, microbiology, ancillary and laboratory) are listed below for reference.    Significant Diagnostic Studies: DG Shoulder 1V Right Result Date: 03/08/2024 EXAM: 1 VIEW(S) XRAY OF THE RIGHT SHOULDER 03/08/2024 11:10:11 AM COMPARISON: None available. CLINICAL HISTORY: Pain FINDINGS: BONES AND JOINTS: Glenohumeral joint is normally aligned. No acute fracture or dislocation. The [facility] joint is unremarkable in appearance. SOFT TISSUES: Right chest port noted. No abnormal calcifications. Progressive interstitial and airspace opacities within both lungs which may reflect acute chest syndrome . IMPRESSION: 1. No acute fracture or dislocation. 2. Progressive diffuse interstitial and airspace opacities within both lungs, concerning for acute chest syndrome, multifocal infection and/or pulmonary edema. . Electronically signed by: Waddell Calk MD 03/08/2024 02:12 PM EST RP Workstation: [id number]   DG Chest Port 1  View Result Date: 03/05/2024 CLINICAL DATA:  Acute hypoxic respiratory failure. EXAM: PORTABLE CHEST 1 VIEW COMPARISON:  Radiographs 02/29/2024 and 02/09/2024.  CT 02/08/2024. FINDINGS: 1000 hours. Accessed right IJ Port-A-Cath is unchanged, extending to the lower SVC level. The heart size and mediastinal contours are stable with mild cardiomegaly and central enlargement of the pulmonary arteries. There has been further improvement in the pulmonary edema. New complete airspace disease, pneumothorax or significant pleural effusion. The bones appear unremarkable. IMPRESSION: Further improvement in pulmonary edema. No new findings. Electronically Signed   By: Elsie Perone M.D.   On: 03/05/2024 14:18   DG Chest Portable 1 View Result Date: 02/29/2024 CLINICAL DATA:  Sickle cell crisis.  Hypoxia. EXAM: PORTABLE CHEST 1 VIEW COMPARISON:  02/09/2024 FINDINGS: The cardio pericardial silhouette is enlarged. Subtle parahilar opacities bilaterally evident, decreased in the interval. Interstitial markings are diffusely coarsened with chronic features. Right Port-A-Cath again noted. Telemetry leads overlie the chest. IMPRESSION: Interval decrease in parahilar opacities suggesting improving edema. Electronically Signed   By: Camellia Candle M.D.   On: 02/29/2024 06:14    Microbiology: Recent Results (from the past 240 hours)  Culture, blood (Routine X 2) w Reflex to ID Panel     Status: None (Preliminary result)   Collection Time: 03/08/24  9:59 AM   Specimen: BLOOD  Result Value Ref Range Status   Specimen Description   Final    BLOOD BLOOD RIGHT HAND Performed at [facility], [address], [place], [place] [address]    Special Requests   Final    BOTTLES DRAWN AEROBIC ONLY Blood Culture results may not be optimal due to an inadequate volume of blood received in culture bottles Performed at [facility], [address], [place], [place] [address]    Culture   Final     NO GROWTH 3 DAYS Performed at [facility], [address] [place], [place] [address]    Report Status PENDING  Incomplete  Culture, blood (Routine X 2) w Reflex to ID Panel     Status: None (Preliminary result)   Collection Time: 03/08/24 10:06 AM   Specimen: BLOOD  Result Value Ref Range Status   Specimen Description   Final    BLOOD BLOOD RIGHT HAND AEROBIC BOTTLE ONLY Performed at [facility], [address] [place], [place] [address]    Special Requests   Final    BOTTLES DRAWN AEROBIC ONLY Blood Culture results may not be optimal due to an inadequate volume of blood received in culture bottles Performed at [facility], [address], [place], [place] [address]    Culture  Setup Time   Final    GRAM POSITIVE RODS AEROBIC BOTTLE ONLY CORRECTED RESULTS CALLED TO: RBV NICK GLOGOVACPHARMD 03/11/2024 @ 0715 BY DD PREVIOUSLY REPORTED AS: GRAM NEGATIVE RODS RBV NICK GLOGOVACPHARMD 03/11/2024 @ 0715 BY DD    Culture   Final    CULTURE REINCUBATED FOR BETTER GROWTH Performed at [facility], [address], [place], [place] [address]    Report Status PENDING  Incomplete  Blood Culture ID Panel (Reflexed)     Status: None   Collection Time: 03/08/24 10:06 AM  Result Value Ref Range Status   Enterococcus faecalis NOT DETECTED NOT DETECTED Final   Enterococcus Faecium NOT DETECTED NOT DETECTED Final   Listeria monocytogenes NOT DETECTED NOT DETECTED Final   Staphylococcus species NOT DETECTED NOT DETECTED Final   Staphylococcus aureus (BCID) NOT DETECTED NOT DETECTED Final   Staphylococcus epidermidis NOT DETECTED NOT DETECTED Final   Staphylococcus lugdunensis NOT DETECTED NOT DETECTED Final   Streptococcus species NOT DETECTED NOT DETECTED Final   Streptococcus agalactiae NOT DETECTED NOT DETECTED Final   Streptococcus pneumoniae NOT DETECTED NOT DETECTED Final   Streptococcus pyogenes NOT DETECTED NOT DETECTED Final    A.calcoaceticus-baumannii NOT DETECTED NOT DETECTED Final   Bacteroides fragilis NOT DETECTED NOT DETECTED Final   Enterobacterales NOT DETECTED NOT DETECTED Final   Enterobacter cloacae complex NOT DETECTED NOT DETECTED Final   Escherichia coli NOT DETECTED NOT DETECTED Final   Klebsiella aerogenes NOT DETECTED NOT DETECTED Final   Klebsiella oxytoca NOT DETECTED NOT DETECTED Final   Klebsiella pneumoniae NOT  DETECTED NOT DETECTED Final   Proteus species NOT DETECTED NOT DETECTED Final   Salmonella species NOT DETECTED NOT DETECTED Final   Serratia marcescens NOT DETECTED NOT DETECTED Final   Haemophilus influenzae NOT DETECTED NOT DETECTED Final   Neisseria meningitidis NOT DETECTED NOT DETECTED Final   Pseudomonas aeruginosa NOT DETECTED NOT DETECTED Final   Stenotrophomonas maltophilia NOT DETECTED NOT DETECTED Final   Candida albicans NOT DETECTED NOT DETECTED Final   Candida auris NOT DETECTED NOT DETECTED Final   Candida glabrata NOT DETECTED NOT DETECTED Final   Candida krusei NOT DETECTED NOT DETECTED Final   Candida parapsilosis NOT DETECTED NOT DETECTED Final   Candida tropicalis NOT DETECTED NOT DETECTED Final   Cryptococcus neoformans/gattii NOT DETECTED NOT DETECTED Final    Comment: Performed at [facility], [address], [place], [place] [address]     Labs: Basic Metabolic Panel: Recent Labs  Lab 03/12/24 0509  NA 138  K 4.3  CL 105  CO2 28  GLUCOSE 108*  BUN 18  CREATININE 1.05  CALCIUM  8.7*   Liver Function Tests: Recent Labs  Lab 03/12/24 0509  AST 72*  ALT 197*  ALKPHOS 69  BILITOT 6.0*  PROT 7.9  ALBUMIN 3.5   No results for input(s): LIPASE, AMYLASE in the last 168 hours. No results for input(s): AMMONIA in the last 168 hours. CBC: Recent Labs  Lab 03/09/24 0730 03/11/24 0602 03/12/24 0509  WBC  --  9.1 7.3  HGB 7.1* 6.8* 6.8*  HCT 20.0* 19.6* 18.0*  MCV  --  92.9 92.8  PLT  --  299 299   Cardiac Enzymes: No results  for input(s): CKTOTAL, CKMB, CKMBINDEX, TROPONINI in the last 168 hours. BNP: Invalid input(s): POCBNP CBG: No results for input(s): GLUCAP in the last 168 hours.  Time coordinating discharge: 50 minutes  Signed:  Wiletta Bermingham  [facility] 03/12/2024, 12:04 PM

## 2024-03-12 NOTE — Progress Notes (Addendum)
 Patient continues to turn off KVO fluids connected to PCA despite multiple attempts at education by this RN. Attempts to lock pump have been unsuccessful. MD made aware.

## 2024-03-13 ENCOUNTER — Other Ambulatory Visit (HOSPITAL_COMMUNITY): Payer: Self-pay

## 2024-03-13 ENCOUNTER — Telehealth: Payer: Self-pay

## 2024-03-13 LAB — CULTURE, BLOOD (ROUTINE X 2): Culture: NO GROWTH

## 2024-03-13 NOTE — Transitions of Care (Post Inpatient/ED Visit) (Signed)
   03/13/2024  Name: Martin [name] MRN: [id number] DOB: 08-25-1990  Today's TOC FU Call Status: Today's TOC FU Call Status:: Successful TOC FU Call Completed TOC FU Call Complete Date: 03/13/24  Patient's Name and Date of Birth confirmed. Name, DOB  Transition Care Management Follow-up Telephone Call Date of Discharge: 03/12/24 Discharge Facility: [facility]) Type of Discharge: Inpatient Admission Primary Inpatient Discharge Diagnosis:: Sickle Cell Crisis How have you been since you were released from the hospital?: Better Any questions or concerns?: No  Items Reviewed: Did you receive and understand the discharge instructions provided?: Yes Medications obtained,verified, and reconciled?: Yes (Medications Reviewed)  Medications Reviewed Today: Verbally states that he has all his medications including the new medications.  Augmentin . Medications Reviewed Today   Medications were not reviewed in this encounter     Placed call to patient for TOC. Explained purpose of call. Patient reports that he is feeling better. Reports that he has his medications. I reviewed importance of finishing his antibiotics and taking this medications with food. Patient reports that he has his follow up appointment with PCP. States that he needs the phone number for the heart failure clinic to reschedule an appointment that he had during his admission. I provided this phone number.  Patient denies any other needs and declines completion of TOC call.  Alan Ee, RN, BSN, CEN [employer]- Transition of Care Team.  [facility] [phone]

## 2024-03-14 ENCOUNTER — Telehealth (HOSPITAL_COMMUNITY): Payer: Self-pay | Admitting: Cardiology

## 2024-03-14 LAB — CULTURE, BLOOD (ROUTINE X 2): Culture  Setup Time: NO GROWTH

## 2024-03-14 NOTE — Progress Notes (Signed)
 Complex Care Management Care Guide Note  03/14/2024 Name: [name] MRN: [id number] DOB: 1990-11-05  Martin [name] is a 33 y.o. year old male who is a primary care patient of [name], [name], [name] and is actively engaged with the care management team. I reached out to [facility] Forte by phone today to assist with re-scheduling  with the BSW.  Follow up plan: Unsuccessful telephone outreach attempt made. A HIPAA compliant phone message was left for the patient providing contact information and requesting a return call.  Leotis Rase [facility], [facility] Guide  Direct Dial: [phone]  Fax [phone]

## 2024-03-15 ENCOUNTER — Other Ambulatory Visit: Payer: Self-pay | Admitting: *Deleted

## 2024-03-15 NOTE — Patient Instructions (Signed)
 Briyan Narvis Forte - I am sorry I was unable to reach you today for our scheduled appointment. I work with [name], [name], [name] and am calling to support your healthcare needs. Please contact me at [phone] at your earliest convenience. I look forward to speaking with you soon.   Thank you,  Zechariah Bissonnette, RN, BSN, ACM RN Care Manager [employer] [phone]

## 2024-03-16 LAB — TYPE AND SCREEN
ABO/RH(D): O POS
Antibody Screen: NEGATIVE
Donor AG Type: NEGATIVE
Unit division: 0

## 2024-03-16 LAB — BPAM RBC
Blood Product Expiration Date: 202512202359
Unit Type and Rh: 5100

## 2024-03-17 ENCOUNTER — Other Ambulatory Visit: Payer: Self-pay

## 2024-03-17 ENCOUNTER — Other Ambulatory Visit (HOSPITAL_COMMUNITY): Payer: Self-pay

## 2024-03-17 ENCOUNTER — Emergency Department (HOSPITAL_COMMUNITY)
Admission: EM | Admit: 2024-03-17 | Discharge: 2024-03-17 | Disposition: A | Discharge status: Home / Self Care | Attending: Emergency Medicine | Admitting: Emergency Medicine

## 2024-03-17 DIAGNOSIS — D5700 Hb-SS disease with crisis, unspecified: Secondary | ICD-10-CM | POA: Diagnosis present

## 2024-03-17 LAB — COMPREHENSIVE METABOLIC PANEL WITH GFR
ALT: 52 U/L — ABNORMAL HIGH (ref 0–44)
AST: 32 U/L (ref 15–41)
Albumin: 3.5 g/dL (ref 3.5–5.0)
Alkaline Phosphatase: 75 U/L (ref 38–126)
Anion gap: 8 (ref 5–15)
BUN: 12 mg/dL (ref 6–20)
CO2: 25 mmol/L (ref 22–32)
Calcium: 8.4 mg/dL — ABNORMAL LOW (ref 8.9–10.3)
Chloride: 107 mmol/L (ref 98–111)
Creatinine, Ser: 1.02 mg/dL (ref 0.61–1.24)
GFR, Estimated: >60 mL/min (ref >60)
Glucose, Bld: 96 mg/dL (ref 70–99)
Potassium: 3.5 mmol/L (ref 3.5–5.1)
Sodium: 140 mmol/L (ref 135–145)
Total Bilirubin: 2.5 mg/dL — ABNORMAL HIGH (ref 0.0–1.2)
Total Protein: 7.4 g/dL (ref 6.5–8.1)

## 2024-03-17 LAB — CBC WITH DIFFERENTIAL/PLATELET
Abs Immature Granulocytes: 0.04 K/uL (ref 0.00–0.07)
Basophils Absolute: 0.1 K/uL (ref 0.0–0.1)
Basophils Relative: 1 %
Eosinophils Absolute: 0 K/uL (ref 0.0–0.5)
Eosinophils Relative: 0 %
HCT: 21.5 % — ABNORMAL LOW (ref 39.0–52.0)
Hemoglobin: 7.3 g/dL — ABNORMAL LOW (ref 13.0–17.0)
Immature Granulocytes: 0 %
Lymphocytes Relative: 44 %
Lymphs Abs: 4.2 K/uL — ABNORMAL HIGH (ref 0.7–4.0)
MCH: 34.6 pg — ABNORMAL HIGH (ref 26.0–34.0)
MCHC: 34 g/dL (ref 30.0–36.0)
MCV: 101.9 fL — ABNORMAL HIGH (ref 80.0–100.0)
Monocytes Absolute: 1.4 K/uL — ABNORMAL HIGH (ref 0.1–1.0)
Monocytes Relative: 15 %
Neutro Abs: 3.8 K/uL (ref 1.7–7.7)
Neutrophils Relative %: 40 %
Platelets: 341 K/uL (ref 150–400)
RBC: 2.11 MIL/uL — ABNORMAL LOW (ref 4.22–5.81)
RDW: 29.3 % — ABNORMAL HIGH (ref 11.5–15.5)
Smear Review: NORMAL
WBC: 9.5 K/uL (ref 4.0–10.5)
nRBC: 85.8 % — ABNORMAL HIGH (ref 0.0–0.2)

## 2024-03-17 LAB — RETICULOCYTES
Immature Retic Fract: 30.6 % — ABNORMAL HIGH (ref 2.3–15.9)
RBC.: 2.12 MIL/uL — ABNORMAL LOW (ref 4.22–5.81)
Retic Count, Absolute: 307.6 K/uL — ABNORMAL HIGH (ref 19.0–186.0)
Retic Ct Pct: 14.5 % — ABNORMAL HIGH (ref 0.4–3.1)

## 2024-03-17 MED ORDER — HYDROMORPHONE HCL 1 MG/ML IJ SOLN
2.0000 mg | INTRAMUSCULAR | Status: AC
  Administered 2024-03-17: 2 mg via INTRAVENOUS
  Filled 2024-03-17: qty 2

## 2024-03-17 MED ORDER — SODIUM CHLORIDE 0.45 % IV SOLN: INTRAVENOUS | Status: DC

## 2024-03-17 MED ORDER — ONDANSETRON HCL 4 MG/2ML IJ SOLN
4.0000 mg | INTRAMUSCULAR | Status: DC | PRN
Start: 2024-03-17 — End: 2024-03-17

## 2024-03-17 MED ORDER — DIPHENHYDRAMINE HCL 25 MG PO CAPS: 25.0000 mg | ORAL_CAPSULE | ORAL | Status: DC | PRN

## 2024-03-17 NOTE — Discharge Instructions (Signed)
 Please return for uncontrolled pain if you develop chest pain fever difficulty breathing.

## 2024-03-17 NOTE — ED Provider Notes (Signed)
 [place] EMERGENCY DEPARTMENT AT [facility] Provider Note   CSN: [id number] Arrival date & time: 03/17/24  0434     Patient presents with: Sickle Cell Pain Crisis   Martin [name] is a 33 y.o. male.   [name] with a chief complaint of some cell pain crisis.  Started this evening.  Thinks is due to the weather.  No new areas of pain.  Feels like sickle cell pain crisis previously.  Denies chest pain denies difficulty breathing denies fevers.  Denies abdominal pain.  Eating and drinking normally.   Sickle Cell Pain Crisis      Prior to Admission medications   Medication Sig Start Date End Date Taking? Authorizing Provider  abacavir -dolutegravir -lamiVUDine  (TRIUMEQ ) 600-50-300 MG tablet Take 1 tablet by mouth daily. Patient taking differently: Take 1 tablet by mouth at bedtime. 08/13/23   Pearlean Manus, MD  albuterol  (VENTOLIN  HFA) 108 (90 Base) MCG/ACT inhaler Inhale 2 puffs into the lungs every 6 (six) hours as needed for wheezing or shortness of breath. 08/13/23   Pearlean Manus, MD  amoxicillin -clavulanate (AUGMENTIN ) 875-125 MG tablet Take 1 tablet by mouth 2 (two) times daily. 03/12/24   Jegede, Olugbemiga E, MD  Cholecalciferol  (VITAMIN D3) 125 MCG (5000 UT) CAPS Take 5,000 Units by mouth every Tuesday.    [provider]  cyclobenzaprine  (FLEXERIL ) 5 MG tablet Take 1 tablet (5 mg total) by mouth 2 (two) times daily as needed for muscle spasms. 02/04/24   [name], [name], [name]  dabigatran  (PRADAXA ) 150 MG CAPS capsule Take 1 capsule (150 mg total) by mouth 2 (two) times daily. 02/09/24   Jegede, Olugbemiga E, MD  dabigatran  (PRADAXA ) 150 MG CAPS capsule Take 1 capsule (150 mg total) by mouth every 12 (twelve) hours. Patient not taking: No sig reported 02/16/24   Cherylene [name] HERO, NP  Deferasirox  (JADENU ) 360 MG TABS Take 2 tablets (720 mg total) by mouth daily. Patient not taking: Reported on 01/04/2024 11/05/23   [name], [name], [name]   diphenhydrAMINE  (BENADRYL ) 25 MG tablet Take 1 tablet (25 mg total) by mouth every 4 (four) hours as needed for itching. Patient taking differently: Take 25 mg by mouth daily as needed for itching. 08/13/23   Pearlean Manus, MD  folic acid  (FOLVITE ) 400 MCG tablet Take 1 tablet (400 mcg total) by mouth daily. 08/13/23   Pearlean Manus, MD  gabapentin  (NEURONTIN ) 300 MG capsule Take 1 capsule (300 mg total) by mouth at bedtime as needed (nerve pain). 08/13/23   Pearlean Manus, MD  guaiFENesin  (MUCINEX ) 600 MG 12 hr tablet Take 1 tablet (600 mg total) by mouth 2 (two) times daily. Patient not taking: Reported on 02/18/2024 08/13/23 08/12/24  Pearlean Manus, MD  guaiFENesin -dextromethorphan  (ROBITUSSIN DM) 100-10 MG/5ML syrup Take 10 mLs by mouth every 4 (four) hours as needed for cough. 03/12/24   Jegede, Olugbemiga E, MD  hydroxyurea  (HYDREA ) 500 MG capsule Take 4 capsules (2,000 mg total) by mouth at bedtime. 08/13/23   Pearlean Manus, MD  NARCAN  4 MG/0.1ML LIQD nasal spray kit Place 1 spray into the nose as needed (respiratory distress / opioid overdose). 01/07/23   [provider]  oxyCODONE -acetaminophen  (PERCOCET) 10-325 MG tablet Take 1 tablet by mouth every 4 (four) hours as needed for pain. Patient taking differently: Take 1 tablet by mouth 5 (five) times daily. 03/05/24   [name], [name], [name]  pantoprazole  (PROTONIX ) 40 MG tablet Take 1 tablet (40 mg total) by mouth daily as needed (acid  reflux). 08/13/23   Pearlean Manus, MD  senna-docusate (SENOKOT-S) 8.6-50 MG tablet Take 2 tablets by mouth at bedtime. Patient not taking: Reported on 03/04/2024 08/13/23   Pearlean Manus, MD    Allergies: Ketorolac  tromethamine , Tape, Wound dressing adhesive, and Bactoshield chg [chlorhexidine  gluconate]    Review of Systems  Updated Vital Signs BP 109/72   Pulse 83   Temp 98.4 F (36.9 C) (Oral)   Resp 18   Wt 63.5 kg   SpO2 91%   BMI 17.50 kg/m   Physical Exam Vitals  and nursing note reviewed.  Constitutional:      Appearance: He is well-developed.  HENT:     Head: Normocephalic and atraumatic.  Eyes:     Pupils: Pupils are equal, round, and reactive to light.  Neck:     Vascular: No JVD.  Cardiovascular:     Rate and Rhythm: Normal rate and regular rhythm.     Heart sounds: No murmur heard.    No friction rub. No gallop.  Pulmonary:     Effort: No respiratory distress.     Breath sounds: No wheezing.  Abdominal:     General: There is no distension.     Tenderness: There is no abdominal tenderness. There is no guarding or rebound.  Musculoskeletal:        General: Normal range of motion.     Cervical back: Normal range of motion and neck supple.  Skin:    Coloration: Skin is not pale.     Findings: No rash.  Neurological:     Mental Status: He is alert and oriented to person, place, and time.  Psychiatric:        Behavior: Behavior normal.     (all labs ordered are listed, but only abnormal results are displayed) Labs Reviewed  CBC WITH DIFFERENTIAL/PLATELET - Abnormal; Notable for the following components:      Result Value   RBC 2.11 (*)    Hemoglobin 7.3 (*)    HCT 21.5 (*)    MCV 101.9 (*)    MCH 34.6 (*)    RDW 29.3 (*)    All other components within normal limits  RETICULOCYTES  COMPREHENSIVE METABOLIC PANEL WITH GFR    EKG: None  Radiology: No results found.   Procedures   Medications Ordered in the ED  0.45 % sodium chloride  infusion (0 mLs Intravenous Stopped 03/17/24 0659)  diphenhydrAMINE  (BENADRYL ) capsule 25-50 mg (has no administration in time range)  ondansetron  (ZOFRAN ) injection 4 mg (has no administration in time range)  HYDROmorphone  (DILAUDID ) injection 2 mg (2 mg Intravenous Given 03/17/24 0542)  HYDROmorphone  (DILAUDID ) injection 2 mg (2 mg Intravenous Given 03/17/24 0615)  HYDROmorphone  (DILAUDID ) injection 2 mg (2 mg Intravenous Given 03/17/24 0654)                                     Medical Decision Making Amount and/or Complexity of Data Reviewed Labs: ordered.  Risk Prescription drug management.   [name] with a chief complaints of sickle cell pain crisis.  Will give 3 doses of narcotics here.  Check labs.  Hemoglobin 7.3 appears to be at baseline.  Patient had been given his 3 doses of pain medicine before his labs at the time resulted.  He is feeling better would like to go home.  Will have him follow-up with the sickle cell clinic.  6:59 AM:  I have discussed the diagnosis/risks/treatment options with the patient.  Evaluation and diagnostic testing in the emergency department does not suggest an emergent condition requiring admission or immediate intervention beyond what has been performed at this time.  They will follow up with Sickle cell. We also discussed returning to the ED immediately if new or worsening sx occur. We discussed the sx which are most concerning (e.g., sudden worsening pain, fever, inability to tolerate by mouth) that necessitate immediate return. Medications administered to the patient during their visit and any new prescriptions provided to the patient are listed below.  Medications given during this visit Medications  0.45 % sodium chloride  infusion (0 mLs Intravenous Stopped 03/17/24 0659)  diphenhydrAMINE  (BENADRYL ) capsule 25-50 mg (has no administration in time range)  ondansetron  (ZOFRAN ) injection 4 mg (has no administration in time range)  HYDROmorphone  (DILAUDID ) injection 2 mg (2 mg Intravenous Given 03/17/24 0542)  HYDROmorphone  (DILAUDID ) injection 2 mg (2 mg Intravenous Given 03/17/24 0615)  HYDROmorphone  (DILAUDID ) injection 2 mg (2 mg Intravenous Given 03/17/24 0654)     The patient appears reasonably screen and/or stabilized for discharge and I doubt any other medical condition or other [facility] requiring further screening, evaluation, or treatment in the ED at this time prior to discharge.       Final diagnoses:  Sickle  cell pain crisis [facility])    ED Discharge Orders     None          Emil Share, [place] 03/17/24 [id number]

## 2024-03-17 NOTE — ED Triage Notes (Signed)
 C/o sickle cell crisis onset tonight; generalized pain all over. Placed on 2L [place], RA sats 87%

## 2024-03-19 ENCOUNTER — Encounter (HOSPITAL_COMMUNITY): Payer: Self-pay | Admitting: *Deleted

## 2024-03-19 ENCOUNTER — Other Ambulatory Visit: Payer: Self-pay

## 2024-03-19 ENCOUNTER — Inpatient Hospital Stay (HOSPITAL_COMMUNITY)
Admission: EM | Admit: 2024-03-19 | Discharge: 2024-03-26 | DRG: 811 | Disposition: A | Attending: Internal Medicine | Admitting: Internal Medicine

## 2024-03-19 DIAGNOSIS — F411 Generalized anxiety disorder: Secondary | ICD-10-CM | POA: Diagnosis present

## 2024-03-19 DIAGNOSIS — R600 Localized edema: Secondary | ICD-10-CM

## 2024-03-19 DIAGNOSIS — D5700 Hb-SS disease with crisis, unspecified: Secondary | ICD-10-CM | POA: Diagnosis not present

## 2024-03-19 DIAGNOSIS — I270 Primary pulmonary hypertension: Secondary | ICD-10-CM | POA: Diagnosis present

## 2024-03-19 DIAGNOSIS — I2699 Other pulmonary embolism without acute cor pulmonale: Secondary | ICD-10-CM | POA: Diagnosis present

## 2024-03-19 DIAGNOSIS — E876 Hypokalemia: Secondary | ICD-10-CM | POA: Diagnosis present

## 2024-03-19 DIAGNOSIS — Z21 Asymptomatic human immunodeficiency virus [HIV] infection status: Secondary | ICD-10-CM | POA: Diagnosis present

## 2024-03-19 DIAGNOSIS — R609 Edema, unspecified: Secondary | ICD-10-CM | POA: Insufficient documentation

## 2024-03-19 DIAGNOSIS — D649 Anemia, unspecified: Secondary | ICD-10-CM | POA: Diagnosis present

## 2024-03-19 DIAGNOSIS — J9621 Acute and chronic respiratory failure with hypoxia: Secondary | ICD-10-CM | POA: Diagnosis present

## 2024-03-19 DIAGNOSIS — K219 Gastro-esophageal reflux disease without esophagitis: Secondary | ICD-10-CM | POA: Diagnosis present

## 2024-03-19 LAB — CBC WITH DIFFERENTIAL/PLATELET
Basophils Absolute: 0 K/uL (ref 0.0–0.1)
Basophils Relative: 0 %
Eosinophils Absolute: 0 K/uL (ref 0.0–0.5)
Eosinophils Relative: 0 %
HCT: 21.6 % — ABNORMAL LOW (ref 39.0–52.0)
Hemoglobin: 7.4 g/dL — ABNORMAL LOW (ref 13.0–17.0)
Lymphocytes Relative: 35 %
Lymphs Abs: 4.1 K/uL — ABNORMAL HIGH (ref 0.7–4.0)
MCH: 34.3 pg — ABNORMAL HIGH (ref 26.0–34.0)
MCHC: 34.3 g/dL (ref 30.0–36.0)
MCV: 100 fL (ref 80.0–100.0)
Monocytes Absolute: 1.1 K/uL — ABNORMAL HIGH (ref 0.1–1.0)
Monocytes Relative: 9 %
Neutro Abs: 6.5 K/uL (ref 1.7–7.7)
Neutrophils Relative %: 56 %
Platelets: 290 K/uL (ref 150–400)
RBC: 2.16 MIL/uL — ABNORMAL LOW (ref 4.22–5.81)
RDW: 26.3 % — ABNORMAL HIGH (ref 11.5–15.5)
WBC: 11.7 K/uL — ABNORMAL HIGH (ref 4.0–10.5)
nRBC: 25.4 % — ABNORMAL HIGH (ref 0.0–0.2)

## 2024-03-19 LAB — RETICULOCYTES
Immature Retic Fract: 27.7 % — ABNORMAL HIGH (ref 2.3–15.9)
RBC.: 2.2 MIL/uL — ABNORMAL LOW (ref 4.22–5.81)
Retic Count, Absolute: 472.4 K/uL — ABNORMAL HIGH (ref 19.0–186.0)
Retic Ct Pct: 21.5 % — ABNORMAL HIGH (ref 0.4–3.1)

## 2024-03-19 LAB — COMPREHENSIVE METABOLIC PANEL WITH GFR
ALT: 41 U/L (ref 0–44)
AST: 37 U/L (ref 15–41)
Albumin: 3.5 g/dL (ref 3.5–5.0)
Alkaline Phosphatase: 77 U/L (ref 38–126)
Anion gap: 11 (ref 5–15)
BUN: 12 mg/dL (ref 6–20)
CO2: 23 mmol/L (ref 22–32)
Calcium: 7.8 mg/dL — ABNORMAL LOW (ref 8.9–10.3)
Chloride: 108 mmol/L (ref 98–111)
Creatinine, Ser: 0.86 mg/dL (ref 0.61–1.24)
GFR, Estimated: >60 mL/min (ref >60)
Glucose, Bld: 108 mg/dL — ABNORMAL HIGH (ref 70–99)
Potassium: 3.2 mmol/L — ABNORMAL LOW (ref 3.5–5.1)
Sodium: 141 mmol/L (ref 135–145)
Total Bilirubin: 3.5 mg/dL — ABNORMAL HIGH (ref 0.0–1.2)
Total Protein: 7.2 g/dL (ref 6.5–8.1)

## 2024-03-19 MED ORDER — HYDROMORPHONE HCL 1 MG/ML IJ SOLN
2.0000 mg | INTRAMUSCULAR | Status: AC
  Administered 2024-03-19: 2 mg via INTRAVENOUS
  Filled 2024-03-19: qty 2

## 2024-03-19 MED ORDER — DIPHENHYDRAMINE HCL 25 MG PO CAPS
25.0000 mg | ORAL_CAPSULE | ORAL | Status: DC | PRN
  Administered 2024-03-20 – 2024-03-25 (×4): 25 mg via ORAL
  Filled 2024-03-19 (×5): qty 1

## 2024-03-19 MED ORDER — VITAMIN D 25 MCG (1000 UNIT) PO TABS
5000.0000 [IU] | ORAL_TABLET | ORAL | Status: DC
  Administered 2024-03-21: 5000 [IU] via ORAL
  Filled 2024-03-19 (×2): qty 5

## 2024-03-19 MED ORDER — GUAIFENESIN-DM 100-10 MG/5ML PO SYRP: 10.0000 mL | ORAL_SOLUTION | ORAL | Status: DC | PRN

## 2024-03-19 MED ORDER — FUROSEMIDE 20 MG PO TABS
10.0000 mg | ORAL_TABLET | Freq: Once | ORAL | Status: AC
  Administered 2024-03-19: 10 mg via ORAL
  Filled 2024-03-19: qty 0.5

## 2024-03-19 MED ORDER — GABAPENTIN 300 MG PO CAPS
300.0000 mg | ORAL_CAPSULE | Freq: Every evening | ORAL | Status: DC | PRN
  Administered 2024-03-23: 300 mg via ORAL
  Filled 2024-03-19: qty 1

## 2024-03-19 MED ORDER — CARMEX CLASSIC LIP BALM EX OINT
TOPICAL_OINTMENT | CUTANEOUS | Status: DC | PRN
  Filled 2024-03-19: qty 10

## 2024-03-19 MED ORDER — DIPHENHYDRAMINE HCL 25 MG PO CAPS
25.0000 mg | ORAL_CAPSULE | ORAL | Status: DC | PRN
  Filled 2024-03-19: qty 1

## 2024-03-19 MED ORDER — DABIGATRAN ETEXILATE MESYLATE 150 MG PO CAPS: 150.0000 mg | ORAL_CAPSULE | Freq: Two times a day (BID) | ORAL | Status: DC

## 2024-03-19 MED ORDER — DEFERASIROX 360 MG PO TABS: 720.0000 mg | ORAL_TABLET | Freq: Every day | ORAL | Status: DC

## 2024-03-19 MED ORDER — BISACODYL 10 MG RE SUPP
10.0000 mg | Freq: Once | RECTAL | Status: AC
  Administered 2024-03-19: 10 mg via RECTAL
  Filled 2024-03-19: qty 1

## 2024-03-19 MED ORDER — ALBUTEROL SULFATE (2.5 MG/3ML) 0.083% IN NEBU: 3.0000 mL | INHALATION_SOLUTION | Freq: Four times a day (QID) | RESPIRATORY_TRACT | Status: DC | PRN

## 2024-03-19 MED ORDER — GUAIFENESIN ER 600 MG PO TB12
600.0000 mg | ORAL_TABLET | Freq: Two times a day (BID) | ORAL | Status: DC
  Administered 2024-03-19 – 2024-03-24 (×7): 600 mg via ORAL
  Filled 2024-03-19 (×11): qty 1

## 2024-03-19 MED ORDER — NALOXONE HCL 0.4 MG/ML IJ SOLN: 0.4000 mg | INTRAMUSCULAR | Status: DC | PRN

## 2024-03-19 MED ORDER — HYDROXYUREA 500 MG PO CAPS
2000.0000 mg | ORAL_CAPSULE | Freq: Every day | ORAL | Status: DC
  Administered 2024-03-19 – 2024-03-25 (×7): 2000 mg via ORAL
  Filled 2024-03-19 (×7): qty 4

## 2024-03-19 MED ORDER — NALOXONE HCL 4 MG/0.1ML NA LIQD: 1.0000 | NASAL | Status: DC | PRN

## 2024-03-19 MED ORDER — SODIUM CHLORIDE 0.9% FLUSH: 9.0000 mL | INTRAVENOUS | Status: DC | PRN

## 2024-03-19 MED ORDER — MICONAZOLE NITRATE POWD: Freq: Two times a day (BID) | Status: AC

## 2024-03-19 MED ORDER — POLYETHYLENE GLYCOL 3350 17 G PO PACK: 17.0000 g | PACK | Freq: Every day | ORAL | Status: DC | PRN

## 2024-03-19 MED ORDER — PANTOPRAZOLE SODIUM 40 MG PO TBEC: 40.0000 mg | DELAYED_RELEASE_TABLET | Freq: Every day | ORAL | Status: DC | PRN

## 2024-03-19 MED ORDER — DIPHENHYDRAMINE HCL 25 MG PO TABS: 25.0000 mg | ORAL_TABLET | Freq: Every day | ORAL | Status: DC | PRN

## 2024-03-19 MED ORDER — FOLIC ACID 1 MG PO TABS
500.0000 ug | ORAL_TABLET | Freq: Every day | ORAL | Status: DC
  Administered 2024-03-20 – 2024-03-26 (×7): 0.5 mg via ORAL
  Filled 2024-03-19 (×7): qty 1

## 2024-03-19 MED ORDER — AMOXICILLIN-POT CLAVULANATE 875-125 MG PO TABS
1.0000 | ORAL_TABLET | Freq: Two times a day (BID) | ORAL | Status: AC
  Administered 2024-03-19 – 2024-03-23 (×9): 1 via ORAL
  Filled 2024-03-19 (×10): qty 1

## 2024-03-19 MED ORDER — ONDANSETRON HCL 4 MG/2ML IJ SOLN
4.0000 mg | INTRAMUSCULAR | Status: DC | PRN
  Administered 2024-03-22 – 2024-03-25 (×5): 4 mg via INTRAVENOUS
  Filled 2024-03-19 (×5): qty 2

## 2024-03-19 MED ORDER — FUROSEMIDE 20 MG PO TABS: 10.0000 mg | ORAL_TABLET | Freq: Every day | ORAL | 0 refills | Status: AC

## 2024-03-19 MED ORDER — SENNOSIDES-DOCUSATE SODIUM 8.6-50 MG PO TABS
1.0000 | ORAL_TABLET | Freq: Two times a day (BID) | ORAL | Status: DC
  Administered 2024-03-19 – 2024-03-25 (×10): 1 via ORAL
  Filled 2024-03-19 (×14): qty 1

## 2024-03-19 MED ORDER — SENNOSIDES-DOCUSATE SODIUM 8.6-50 MG PO TABS: 2.0000 | ORAL_TABLET | Freq: Every day | ORAL | Status: DC

## 2024-03-19 MED ORDER — CYCLOBENZAPRINE HCL 5 MG PO TABS: 5.0000 mg | ORAL_TABLET | Freq: Two times a day (BID) | ORAL | Status: DC | PRN

## 2024-03-19 MED ORDER — OXYCODONE HCL 5 MG PO TABS
10.0000 mg | ORAL_TABLET | Freq: Once | ORAL | Status: AC
  Administered 2024-03-19: 10 mg via ORAL
  Filled 2024-03-19: qty 2

## 2024-03-19 MED ORDER — DABIGATRAN ETEXILATE MESYLATE 150 MG PO CAPS
150.0000 mg | ORAL_CAPSULE | Freq: Two times a day (BID) | ORAL | Status: DC
  Administered 2024-03-19 – 2024-03-26 (×14): 150 mg via ORAL
  Filled 2024-03-19 (×14): qty 1

## 2024-03-19 MED ORDER — HYDROMORPHONE 1 MG/ML IV SOLN
INTRAVENOUS | Status: DC
  Administered 2024-03-19: 7.5 mg via INTRAVENOUS
  Administered 2024-03-19 – 2024-03-20 (×2): 30 mg via INTRAVENOUS
  Administered 2024-03-20: 2.5 mg via INTRAVENOUS
  Administered 2024-03-20: 8.5 mg via INTRAVENOUS
  Administered 2024-03-20: 7 mg via INTRAVENOUS
  Administered 2024-03-20 – 2024-03-21 (×2): 8 mg via INTRAVENOUS
  Administered 2024-03-21: 4 mg via INTRAVENOUS
  Administered 2024-03-21: 5 mg via INTRAVENOUS
  Administered 2024-03-21: 30 mg via INTRAVENOUS
  Administered 2024-03-21: 9 mg via INTRAVENOUS
  Administered 2024-03-21: 30 mg via INTRAVENOUS
  Administered 2024-03-21: 12 mg via INTRAVENOUS
  Administered 2024-03-22: 8.5 mg via INTRAVENOUS
  Administered 2024-03-22: 5.5 mg via INTRAVENOUS
  Administered 2024-03-22: 7 mg via INTRAVENOUS
  Administered 2024-03-22: 30 mg via INTRAVENOUS
  Administered 2024-03-23: 3 mg via INTRAVENOUS
  Administered 2024-03-23: 6 mg via INTRAVENOUS
  Administered 2024-03-23: 2 mg via INTRAVENOUS
  Administered 2024-03-23: 1 mg via INTRAVENOUS
  Administered 2024-03-23: 7.5 mg via INTRAVENOUS
  Administered 2024-03-23: 30 mg via INTRAVENOUS
  Administered 2024-03-24: 6 mg via INTRAVENOUS
  Administered 2024-03-24: 2.5 mg via INTRAVENOUS
  Administered 2024-03-24: 5.5 mg via INTRAVENOUS
  Administered 2024-03-24: 30 mg via INTRAVENOUS
  Filled 2024-03-19 (×7): qty 30

## 2024-03-19 MED ORDER — ABACAVIR-DOLUTEGRAVIR-LAMIVUD 600-50-300 MG PO TABS
1.0000 | ORAL_TABLET | Freq: Every day | ORAL | Status: DC
  Administered 2024-03-19 – 2024-03-25 (×7): 1 via ORAL
  Filled 2024-03-19 (×7): qty 1

## 2024-03-19 NOTE — H&P (Signed)
 History and Physical    Patient: Martin [name] FMW:[id number] DOB: 1990-09-27 DOA: 03/19/2024 DOS: the patient was seen and examined on 03/19/2024 PCP: [name], [name], [name]  Patient coming from: Home  Chief Complaint:  Chief Complaint  Patient presents with   Sickle Cell Pain Crisis   HPI: Martin [name] is a 33 y.o. male with medical history significant of Sickle cell disease, HIV disease, chronic respiratory failure due to chronic diastolic heart failure, anxiety disorder, anemia of chronic disease, chronic pain syndrome, vitamin D  deficiency, who was just discharged from the hospital on the 23rd about a week ago after admission with sickle cell pain crisis.  Patient has since been back to the ER twice.  He returned today with another pain consistent with his typical sickle cell crisis.  Pain mainly in the lower back and hips.  He has taken his home regimen with minimal relief.  Patient was seen in the ER and has received up to 6 mg of IV Dilaudid  and 3 doses but still having pain that is uncontrolled.  EDP has called us  to admit the patient for pain management.  Patient has been taking his HIV medications.  He is also being worked up for acute on chronic heart failure.  He has had recent echocardiogram that is indicated with diastolic dysfunction.  He has shortness of breath at baseline and is currently on oxygen .  Patient is being admitted for acute sickle cell pain crisis.  Review of Systems: As mentioned in the history of present illness. All other systems reviewed and are negative. Past Medical History:  Diagnosis Date   Anxiety    Chest pain 02/04/2024   HIV (human immunodeficiency virus infection) (HCC)    Proteinuria    Sickle cell disease (HCC)    Vitamin D  deficiency 10/2018   Past Surgical History:  Procedure Laterality Date   IR IMAGING GUIDED PORT INSERTION  08/29/2019   IR REMOVAL TUN ACCESS W/ PORT W/O FL MOD SED  08/30/2020   TEE WITHOUT  CARDIOVERSION N/A 08/30/2020   Procedure: TRANSESOPHAGEAL ECHOCARDIOGRAM (TEE);  Surgeon: Mona Vinie BROCKS, MD;  Location: [facility] ENDOSCOPY;  Service: Cardiovascular;  Laterality: N/A;   Social History:  reports that he has never smoked. He has never used smokeless tobacco. He reports that he does not currently use drugs after having used the following drugs: Marijuana. He reports that he does not drink alcohol .  Allergies  Allergen Reactions   Ketorolac  Tromethamine  Swelling and Other (See Comments)    Patient reports facial edema and left arm edema after administration.   Tape Rash and Other (See Comments)    PLEASE DO NOT USE THE CLEAR, THICK, PLASTIC TAPE- only paper tape is tolerated    Wound Dressing Adhesive Rash   Bactoshield Chg [Chlorhexidine  Gluconate] Other (See Comments)    Per patient it makes me breakout    Family History  Problem Relation Age of Onset   Sickle cell trait Mother    Sickle cell trait Father    Birth defects Maternal Grandmother    Birth defects Paternal Grandmother     Prior to Admission medications   Medication Sig Start Date End Date Taking? Authorizing Provider  furosemide  (LASIX ) 20 MG tablet Take 0.5-1 tablets (10-20 mg total) by mouth daily for 7 days. 03/19/24 03/26/24 Yes Cardama, Raynell Moder, MD  abacavir -dolutegravir -lamiVUDine  (TRIUMEQ ) 600-50-300 MG tablet Take 1 tablet by mouth daily. Patient taking differently: Take 1 tablet by mouth at bedtime. 08/13/23   Emokpae,  Courage, MD  albuterol  (VENTOLIN  HFA) 108 (90 Base) MCG/ACT inhaler Inhale 2 puffs into the lungs every 6 (six) hours as needed for wheezing or shortness of breath. 08/13/23   Pearlean Manus, MD  amoxicillin -clavulanate (AUGMENTIN ) 875-125 MG tablet Take 1 tablet by mouth 2 (two) times daily. 03/12/24   Jegede, Olugbemiga E, MD  Cholecalciferol  (VITAMIN D3) 125 MCG (5000 UT) CAPS Take 5,000 Units by mouth every Tuesday.    [provider]  cyclobenzaprine  (FLEXERIL ) 5 MG  tablet Take 1 tablet (5 mg total) by mouth 2 (two) times daily as needed for muscle spasms. 02/04/24   [name], [name], [name]  dabigatran  (PRADAXA ) 150 MG CAPS capsule Take 1 capsule (150 mg total) by mouth 2 (two) times daily. 02/09/24   Jegede, Olugbemiga E, MD  dabigatran  (PRADAXA ) 150 MG CAPS capsule Take 1 capsule (150 mg total) by mouth every 12 (twelve) hours. Patient not taking: No sig reported 02/16/24   Cherylene [name] HERO, NP  Deferasirox  (JADENU ) 360 MG TABS Take 2 tablets (720 mg total) by mouth daily. Patient not taking: Reported on 01/04/2024 11/05/23   [name], [name], [name]  diphenhydrAMINE  (BENADRYL ) 25 MG tablet Take 1 tablet (25 mg total) by mouth every 4 (four) hours as needed for itching. Patient taking differently: Take 25 mg by mouth daily as needed for itching. 08/13/23   Pearlean Manus, MD  folic acid  (FOLVITE ) 400 MCG tablet Take 1 tablet (400 mcg total) by mouth daily. 08/13/23   Pearlean Manus, MD  gabapentin  (NEURONTIN ) 300 MG capsule Take 1 capsule (300 mg total) by mouth at bedtime as needed (nerve pain). 08/13/23   Pearlean Manus, MD  guaiFENesin  (MUCINEX ) 600 MG 12 hr tablet Take 1 tablet (600 mg total) by mouth 2 (two) times daily. Patient not taking: Reported on 02/18/2024 08/13/23 08/12/24  Pearlean Manus, MD  guaiFENesin -dextromethorphan  (ROBITUSSIN DM) 100-10 MG/5ML syrup Take 10 mLs by mouth every 4 (four) hours as needed for cough. 03/12/24   Jegede, Olugbemiga E, MD  hydroxyurea  (HYDREA ) 500 MG capsule Take 4 capsules (2,000 mg total) by mouth at bedtime. 08/13/23   Pearlean Manus, MD  NARCAN  4 MG/0.1ML LIQD nasal spray kit Place 1 spray into the nose as needed (respiratory distress / opioid overdose). 01/07/23   [provider]  oxyCODONE -acetaminophen  (PERCOCET) 10-325 MG tablet Take 1 tablet by mouth every 4 (four) hours as needed for pain. Patient taking differently: Take 1 tablet by mouth 5 (five) times daily. 03/05/24   [name], [name],  [name]  pantoprazole  (PROTONIX ) 40 MG tablet Take 1 tablet (40 mg total) by mouth daily as needed (acid reflux). 08/13/23   Pearlean Manus, MD  senna-docusate (SENOKOT-S) 8.6-50 MG tablet Take 2 tablets by mouth at bedtime. Patient not taking: Reported on 03/04/2024 08/13/23   Pearlean Manus, MD    Physical Exam: Vitals:   03/19/24 0423 03/19/24 0723 03/19/24 1015 03/19/24 1330  BP:  109/73 102/63 116/79  Pulse:  78 77 67  Resp:  15 15 16   Temp:  98.3 F (36.8 C) 98.8 F (37.1 C) 98.7 F (37.1 C)  TempSrc:  Oral Oral   SpO2: 96% 91% 95% 95%   Constitutional: Chronically ill looking, NAD, calm, comfortable Eyes: PERRL, lids and conjunctivae normal ENMT: Mucous membranes are dry. Posterior pharynx clear of any exudate or lesions.Normal dentition.  Neck: normal, supple, no masses, no thyromegaly Respiratory: clear to auscultation bilaterally, no wheezing, no crackles. Normal respiratory effort. No accessory muscle use.  Cardiovascular: Regular rate  and rhythm, no murmurs / rubs / gallops. No extremity edema. 2+ pedal pulses. No carotid bruits.  Abdomen: no tenderness, no masses palpated. No hepatosplenomegaly. Bowel sounds positive.  Musculoskeletal: Good range of motion, no joint swelling or tenderness, Skin: no rashes, lesions, ulcers. No induration Neurologic: CN 2-12 grossly intact. Sensation intact, DTR normal. Strength 5/5 in all 4.  Psychiatric: Normal judgment and insight. Alert and oriented x 3. Normal mood  Data Reviewed:  Vitals appear stable, his white count is 11.7 hemoglobin 7.4.  Potassium 3.2 calcium  78 and glucose 108.Reticulocyte count is 21.5% total reticulocyte count 472.  Assessment and Plan:  #1 sickle cell pain crisis: Patient has underlying chronic pain.  He has been in and out of hospital lately with sickle cell pain crisis.  Will admit the patient for pain control.  Initiate Dilaudid  PCA.  Patient has history of CHF so avoid fluid but KVO.  No Toradol  due to  allergies.  Continue other oral medications short acting.  Continue other supportive care.  Reassess pain closely.  #2 HIV disease: Under control on HAART.  Continue home regimen  #3 anemia of chronic disease: H&H is at his baseline.  Continue to monitor  #4 chronic pain syndrome: Will continue chronic pain medications.  #5 leukocytosis: Probably vaso-occlusive crisis.  Continue to monitor  #6 hypokalemia:Mild.  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  Continue    Advance Care Planning:   Code Status: Full Code   Consults: None  Family Communication: No family at bedside  Severity of Illness: The appropriate patient status for this patient is INPATIENT. Inpatient status is judged to be reasonable and necessary in order to provide the required intensity of service to ensure the patient's safety. The patient's presenting symptoms, physical exam findings, and initial radiographic and laboratory data in the context of their chronic comorbidities is felt to place them at high risk for further clinical deterioration. Furthermore, it is not anticipated that the patient will be medically stable for discharge from the hospital within 2 midnights of admission.   * I certify that at the point of admission it is my clinical judgment that the patient will require inpatient hospital care spanning beyond 2 midnights from the point of admission due to high intensity of service, high risk for further deterioration and high frequency of surveillance required.*  AuthorBETHA SIM KNOLL, MD 03/19/2024 2:49 PM  For on call review [email or web address].

## 2024-03-19 NOTE — ED Triage Notes (Signed)
 C/o generalized pain all over consistent with sickle cell pain since yesterday.

## 2024-03-19 NOTE — Plan of Care (Signed)

## 2024-03-19 NOTE — ED Provider Notes (Signed)
  [place] EMERGENCY DEPARTMENT AT [facility] Provider Assume Care Note I assumed care of Martin [name] on 03/19/2024 at 7 AM from Dr. Trine.   Briefly, Martin [name] is a 33 y.o. male who: PMHx: Sickle cell anemia, HIV, right-sided heart failure P/w sickle cell pain crisis, has new peripheral edema on exam Has pending cardiology referral, had echo in October demonstrating mild right-sided heart failure  Clinical Course as of 03/19/24 0706  Sun Mar 19, 2024  0703 Sickle cell pain crisis, new peripheral edema, has standing referral to cards for edema, echo October overall reassuring, meds and reeval pain  [LS]    Clinical Course User Index [LS] Rogelia Jerilynn RAMAN, MD    Plan at the time of handoff: Reassess after medications for pain crisis   Please refer to the original provider's note for additional information regarding the care of Martin [name].  Reassessment: I personally reassessed the patient: ***Patient without any acute complaints or additional questions. ***Patient complained of ***.   Vital Signs:  ED Triage Vitals [03/19/24 0419]  Encounter Vitals Group     BP 116/76     Girls Systolic BP Percentile      Girls Diastolic BP Percentile      Boys Systolic BP Percentile      Boys Diastolic BP Percentile      Pulse Rate 77     Resp 16     Temp 97.9 F (36.6 C)     Temp Source Oral     SpO2 (!) 89 %     Weight      Height      Head Circumference      Peak Flow      Pain Score 10     Pain Loc      Pain Education      Exclude from Growth Chart      Hemodynamics:  The patient is ***hemodynamically stable. Mental Status:  The patient is ***alert  Additional MDM: ***  Disposition: {ODIPDEN:66611}   Martin [name] Rogelia, MD Emergency Medicine

## 2024-03-19 NOTE — ED Provider Notes (Signed)
 [place] [facility]MENT AT [facility] Provider Note  CSN: [id number] Arrival date & time: 03/19/24 9590  Chief Complaint(s) Sickle Cell Pain Crisis  HPI Martin [name] is a 33 y.o. male    The history is provided by the patient.  Sickle Cell Pain Crisis Location:  Hip and lower extremity Severity:  Severe Onset quality:  Gradual Duration:  2 days Similar to previous crisis episodes: yes   Timing:  Constant Progression:  Worsening Chronicity:  Recurrent Context: cold exposure   Relieved by:  Nothing Associated symptoms: swelling of legs (new as of 2 days ago)   Associated symptoms: no chest pain, no congestion, no fever, no nausea, no shortness of breath and no wheezing     Past Medical History Past Medical History:  Diagnosis Date   Anxiety    Chest pain 02/04/2024   HIV (human immunodeficiency virus infection) (HCC)    Proteinuria    Sickle cell disease (HCC)    Vitamin D  deficiency 10/2018   Patient Active Problem List   Diagnosis Date Noted   Right shoulder pain 03/08/2024   Sickle cell pain crisis (HCC) 02/08/2024   Right flank pain 02/04/2024   Elevated brain natriuretic peptide (BNP) level 02/04/2024   Chest pain 02/04/2024   Acute cystitis with hematuria 02/04/2024   Muscle spasm 02/04/2024   Decrease in appetite 02/04/2024   Acute on chronic hypoxic respiratory failure (HCC) 01/16/2024   Chronic anticoagulation 12/18/2023   Sickle cell crisis (HCC) 10/12/2023   Chronic hypoxic respiratory failure (HCC) 08/04/2023   Iron overload, 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   Influenza A with pneumonia 06/20/2023   Acute hypoxemic respiratory failure (HCC) 06/20/2023   AKI (acute kidney injury) 06/20/2023   Influenza A with respiratory manifestations 06/20/2023   Acute on chronic anemia 03/16/2023   History of HIV infection (HCC) 03/16/2023   Hypokalemia 03/16/2023   Anemia of chronic disease 07/13/2022   Hyperbilirubinemia 07/13/2022    Tinea capitis 07/29/2021   Bacteremia due to Enterococcus 08/29/2020   Acute chest syndrome (HCC) 08/26/2020   Hypoxia 07/09/2020   COVID-19 05/13/2020   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/18/2020   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   Abnormal penile discharge, without blood 02/22/2020   Leukocytosis 01/02/2020   Anxiety 11/27/2019   Proteinuria 11/27/2019   Acute on chronic respiratory failure with hypoxia (HCC) 11/16/2019   Chronic, continuous use of opioids 08/15/2019   Seasonal allergies 08/15/2019   Chest congestion    Chronic pain syndrome 08/04/2019   History of pulmonary embolism 08/04/2019   Acute pulmonary embolism (HCC) 02/10/2019   Acute chest syndrome due to sickle cell crisis (HCC) 02/10/2019   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) 03/09/2018   Bone pain 03/07/2018   Hip pain 03/07/2018   Acute bronchitis due to Streptococcus 02/21/2018   Heart murmur 02/03/2017   Sickle cell disease with crisis (HCC) 02/02/2017   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 Authorizing Provider  furosemide  (LASIX ) 20 MG tablet Take 0.5-1 tablets (10-20 mg total) by mouth daily for 7 days. 03/19/24 03/26/24 Yes Marelly Wehrman, Raynell Moder, MD  abacavir -dolutegravir -lamiVUDine  (TRIUMEQ ) 600-50-300  MG tablet Take 1 tablet by mouth daily. Patient taking differently: Take 1 tablet by mouth at bedtime. 08/13/23   Pearlean Manus, MD  albuterol  (VENTOLIN  HFA) 108 (90 Base) MCG/ACT inhaler Inhale 2 puffs into the lungs every 6 (six) hours as needed for wheezing or shortness of breath. 08/13/23    Pearlean Manus, MD  amoxicillin -clavulanate (AUGMENTIN ) 875-125 MG tablet Take 1 tablet by mouth 2 (two) times daily. 03/12/24   Jegede, Olugbemiga E, MD  Cholecalciferol  (VITAMIN D3) 125 MCG (5000 UT) CAPS Take 5,000 Units by mouth every Tuesday.    [provider]  cyclobenzaprine  (FLEXERIL ) 5 MG tablet Take 1 tablet (5 mg total) by mouth 2 (two) times daily as needed for muscle spasms. 02/04/24   [name], [name], [name]  dabigatran  (PRADAXA ) 150 MG CAPS capsule Take 1 capsule (150 mg total) by mouth 2 (two) times daily. 02/09/24   Jegede, Olugbemiga E, MD  dabigatran  (PRADAXA ) 150 MG CAPS capsule Take 1 capsule (150 mg total) by mouth every 12 (twelve) hours. Patient not taking: No sig reported 02/16/24   Cherylene [name] HERO, NP  Deferasirox  (JADENU ) 360 MG TABS Take 2 tablets (720 mg total) by mouth daily. Patient not taking: Reported on 01/04/2024 11/05/23   [name], [name], [name]  diphenhydrAMINE  (BENADRYL ) 25 MG tablet Take 1 tablet (25 mg total) by mouth every 4 (four) hours as needed for itching. Patient taking differently: Take 25 mg by mouth daily as needed for itching. 08/13/23   Pearlean Manus, MD  folic acid  (FOLVITE ) 400 MCG tablet Take 1 tablet (400 mcg total) by mouth daily. 08/13/23   Pearlean Manus, MD  gabapentin  (NEURONTIN ) 300 MG capsule Take 1 capsule (300 mg total) by mouth at bedtime as needed (nerve pain). 08/13/23   Pearlean Manus, MD  guaiFENesin  (MUCINEX ) 600 MG 12 hr tablet Take 1 tablet (600 mg total) by mouth 2 (two) times daily. Patient not taking: Reported on 02/18/2024 08/13/23 08/12/24  Pearlean Manus, MD  guaiFENesin -dextromethorphan  (ROBITUSSIN DM) 100-10 MG/5ML syrup Take 10 mLs by mouth every 4 (four) hours as needed for cough. 03/12/24   Jegede, Olugbemiga E, MD  hydroxyurea  (HYDREA ) 500 MG capsule Take 4 capsules (2,000 mg total) by mouth at bedtime. 08/13/23   Pearlean Manus, MD  NARCAN  4 MG/0.1ML LIQD nasal spray kit Place 1 spray into the  nose as needed (respiratory distress / opioid overdose). 01/07/23   [provider]  oxyCODONE -acetaminophen  (PERCOCET) 10-325 MG tablet Take 1 tablet by mouth every 4 (four) hours as needed for pain. Patient taking differently: Take 1 tablet by mouth 5 (five) times daily. 03/05/24   [name], [name], [name]  pantoprazole  (PROTONIX ) 40 MG tablet Take 1 tablet (40 mg total) by mouth daily as needed (acid reflux). 08/13/23   Pearlean Manus, MD  senna-docusate (SENOKOT-S) 8.6-50 MG tablet Take 2 tablets by mouth at bedtime. Patient not taking: Reported on 03/04/2024 08/13/23   Pearlean Manus, MD  Allergies Ketorolac  tromethamine , Tape, Wound dressing adhesive, and Bactoshield chg [chlorhexidine  gluconate]  Review of Systems Review of Systems  Constitutional:  Negative for fever.  HENT:  Negative for congestion.   Respiratory:  Negative for shortness of breath and wheezing.   Cardiovascular:  Negative for chest pain.  Gastrointestinal:  Negative for nausea.   As noted in HPI  Physical Exam Vital Signs  I have reviewed the triage vital signs BP 116/76 (BP Location: Left Arm)   Pulse 77   Temp 97.9 F (36.6 C) (Oral)   Resp 16   SpO2 96%   Physical Exam Vitals reviewed.  Constitutional:      General: He is not in acute distress.    Appearance: He is well-developed. He is not diaphoretic.  HENT:     Head: Normocephalic and atraumatic.     Right Ear: External ear normal.     Left Ear: External ear normal.     Nose: Nose normal.     Mouth/Throat:     Mouth: Mucous membranes are moist.  Eyes:     General: No scleral icterus.    Conjunctiva/sclera: Conjunctivae normal.  Neck:     Trachea: Phonation normal.  Cardiovascular:     Rate and Rhythm: Normal rate and regular rhythm.  Pulmonary:     Effort: Pulmonary effort is normal. No respiratory  distress.     Breath sounds: No stridor.  Abdominal:     General: There is no distension.  Musculoskeletal:        General: Normal range of motion.     Cervical back: Normal range of motion.     Right lower leg: 1+ Pitting Edema present.     Left lower leg: 1+ Pitting Edema present.  Neurological:     Mental Status: He is alert and oriented to person, place, and time.  Psychiatric:        Behavior: Behavior normal.     ED Results and Treatments Labs (all labs ordered are listed, but only abnormal results are displayed) Labs Reviewed  CBC WITH DIFFERENTIAL/PLATELET - Abnormal; Notable for the following components:      Result Value   WBC 11.7 (*)    RBC 2.16 (*)    Hemoglobin 7.4 (*)    HCT 21.6 (*)    MCH 34.3 (*)    RDW 26.3 (*)    nRBC 25.4 (*)    Lymphs Abs 4.1 (*)    Monocytes Absolute 1.1 (*)    All other components within normal limits  RETICULOCYTES - Abnormal; Notable for the following components:   Retic Ct Pct 21.5 (*)    RBC. 2.20 (*)    Retic Count, Absolute 472.4 (*)    Immature Retic Fract 27.7 (*)    All other components within normal limits  COMPREHENSIVE METABOLIC PANEL WITH GFR - Abnormal; Notable for the following components:   Potassium 3.2 (*)    Glucose, Bld 108 (*)    Calcium  7.8 (*)    Total Bilirubin 3.5 (*)    All other components within normal limits  EKG  EKG Interpretation Date/Time:    Ventricular Rate:    PR Interval:    QRS Duration:    QT Interval:    QTC Calculation:   R Axis:      Text Interpretation:         Radiology No results found.  Medications Ordered in ED Medications  HYDROmorphone  (DILAUDID ) injection 2 mg (has no administration in time range)  HYDROmorphone  (DILAUDID ) injection 2 mg (has no administration in time range)  diphenhydrAMINE  (BENADRYL ) capsule 25-50 mg (0 mg Oral Hold 03/19/24  0641)  ondansetron  (ZOFRAN ) injection 4 mg (has no administration in time range)  furosemide  (LASIX ) tablet 10 mg (has no administration in time range)  HYDROmorphone  (DILAUDID ) injection 2 mg (2 mg Intravenous Given 03/19/24 0641)   Procedures Procedures  (including critical care time) Medical Decision Making / ED Course   Medical Decision Making Amount and/or Complexity of Data Reviewed Labs: ordered. Decision-making details documented in ED Course.  Risk Prescription drug management. Parenteral controlled substances.     Clinical Course as of 03/19/24 0716       0714 Labs at baseline and not consistent with acute crisis. Getting pain meds. Plan to reassess. Anticipate DC.  No AKI or low albumin/protein leading to edema. He did have recent ECHO in Oct showing mild right heart dysfuntion (cor pulmomale). Will give oral lasix  and Rx for short course to continue upon DC.   Patient care turned over to oncoming provider. Patient case and results discussed in detail; please see their note for further ED managment.   [PC]    Clinical Course User Index [LS] Rogelia Jerilynn RAMAN, MD [PC] Trine Raynell Moder, MD    Final Clinical Impression(s) / ED Diagnoses Final diagnoses:  Sickle cell anemia with pain [facility])  Peripheral edema    This chart was dictated using voice recognition software.  Despite best efforts to proofread,  errors can occur which can change the documentation meaning.    Trine Raynell Moder, MD 03/19/24 [phone]

## 2024-03-20 DIAGNOSIS — R609 Edema, unspecified: Secondary | ICD-10-CM | POA: Insufficient documentation

## 2024-03-20 LAB — CBC WITH DIFFERENTIAL/PLATELET
Abs Immature Granulocytes: 0.06 K/uL (ref 0.00–0.07)
Basophils Absolute: 0 K/uL (ref 0.0–0.1)
Basophils Relative: 0 %
Eosinophils Absolute: 0.1 K/uL (ref 0.0–0.5)
Eosinophils Relative: 0 %
HCT: 21.4 % — ABNORMAL LOW (ref 39.0–52.0)
Hemoglobin: 7.4 g/dL — ABNORMAL LOW (ref 13.0–17.0)
Immature Granulocytes: 0 %
Lymphocytes Relative: 18 %
Lymphs Abs: 2.8 K/uL (ref 0.7–4.0)
MCH: 33.5 pg (ref 26.0–34.0)
MCHC: 34.6 g/dL (ref 30.0–36.0)
MCV: 96.8 fL (ref 80.0–100.0)
Monocytes Absolute: 1.1 K/uL — ABNORMAL HIGH (ref 0.1–1.0)
Monocytes Relative: 7 %
Neutro Abs: 11.2 K/uL — ABNORMAL HIGH (ref 1.7–7.7)
Neutrophils Relative %: 75 %
Platelets: 289 K/uL (ref 150–400)
RBC: 2.21 MIL/uL — ABNORMAL LOW (ref 4.22–5.81)
RDW: 24.1 % — ABNORMAL HIGH (ref 11.5–15.5)
WBC: 15.3 K/uL — ABNORMAL HIGH (ref 4.0–10.5)
nRBC: 10.5 % — ABNORMAL HIGH (ref 0.0–0.2)

## 2024-03-20 LAB — COMPREHENSIVE METABOLIC PANEL WITH GFR
ALT: 37 U/L (ref 0–44)
AST: 36 U/L (ref 15–41)
Albumin: 3.5 g/dL (ref 3.5–5.0)
Alkaline Phosphatase: 73 U/L (ref 38–126)
Anion gap: 8 (ref 5–15)
BUN: 9 mg/dL (ref 6–20)
CO2: 26 mmol/L (ref 22–32)
Calcium: 8 mg/dL — ABNORMAL LOW (ref 8.9–10.3)
Chloride: 107 mmol/L (ref 98–111)
Creatinine, Ser: 0.87 mg/dL (ref 0.61–1.24)
GFR, Estimated: >60 mL/min (ref >60)
Glucose, Bld: 93 mg/dL (ref 70–99)
Potassium: 3.9 mmol/L (ref 3.5–5.1)
Sodium: 141 mmol/L (ref 135–145)
Total Bilirubin: 4.3 mg/dL — ABNORMAL HIGH (ref 0.0–1.2)
Total Protein: 7 g/dL (ref 6.5–8.1)

## 2024-03-20 MED ORDER — SODIUM CHLORIDE 0.9% FLUSH
10.0000 mL | Freq: Two times a day (BID) | INTRAVENOUS | Status: DC
  Administered 2024-03-20 – 2024-03-25 (×10): 10 mL

## 2024-03-20 MED ORDER — SODIUM CHLORIDE 0.9% FLUSH: 10.0000 mL | INTRAVENOUS | Status: DC | PRN

## 2024-03-20 MED ORDER — OXYCODONE HCL 5 MG PO TABS
5.0000 mg | ORAL_TABLET | ORAL | Status: DC | PRN
  Administered 2024-03-20 – 2024-03-26 (×23): 5 mg via ORAL
  Filled 2024-03-20 (×24): qty 1

## 2024-03-20 MED ORDER — CHLORHEXIDINE GLUCONATE CLOTH 2 % EX PADS
6.0000 | MEDICATED_PAD | Freq: Every day | CUTANEOUS | Status: DC
  Administered 2024-03-20 – 2024-03-23 (×3): 6 via TOPICAL

## 2024-03-20 MED ORDER — OXYCODONE-ACETAMINOPHEN 10-325 MG PO TABS: 1.0000 | ORAL_TABLET | ORAL | Status: DC | PRN

## 2024-03-20 MED ORDER — MICONAZOLE NITRATE 2 % EX CREA
TOPICAL_CREAM | Freq: Two times a day (BID) | CUTANEOUS | Status: DC
  Administered 2024-03-20 – 2024-03-25 (×3): 1 via TOPICAL
  Filled 2024-03-20: qty 28

## 2024-03-20 MED ORDER — OXYCODONE-ACETAMINOPHEN 5-325 MG PO TABS
1.0000 | ORAL_TABLET | ORAL | Status: DC | PRN
  Administered 2024-03-20 – 2024-03-26 (×23): 1 via ORAL
  Filled 2024-03-20 (×25): qty 1

## 2024-03-20 MED ORDER — SODIUM CHLORIDE 0.9% FLUSH
10.0000 mL | Freq: Two times a day (BID) | INTRAVENOUS | Status: DC
  Administered 2024-03-20 – 2024-03-26 (×13): 10 mL

## 2024-03-20 NOTE — Progress Notes (Cosign Needed)
 Patient ID: Martin [name], male   DOB: 22-Oct-1990, 33 y.o.   MRN: [id number] Subjective: Martin [name]  is a 33 y.o. male with history of sickle cell disease, history of acute chest syndrome, multiple sickle cell crisis, multiple ED visits and hospital admission, history of PE currently on Xerelto, chronic hypoxic respiratory failure on home oxygen , HIV infection and generalized anxiety disorder who presented to the ED with major complaints of generalized body pain, lower back pain, and bilateral hip pain that is consistent with his typical sickle cell pain crisis.   Patient endorses pain of 9/10 this morning. He has no new concerns. He denies cough, subject fever,  No N/V/D. No urinary symptoms.    Objective:  Vital signs in last 24 hours:  Vitals:   03/21/24 0412 03/21/24 0644 03/21/24 0800 03/21/24 0954  BP:  116/81  110/82  Pulse:  90  84  Resp: 16 18 19 12   Temp:  98.1 F (36.7 C)  98.7 F (37.1 C)  TempSrc:  Oral  Oral  SpO2: 97% 93% 94% 96%  Weight:      Height:        Intake/Output from previous day:   Intake/Output Summary (Last 24 hours) at 03/21/2024 1157 Last data filed at 03/21/2024 1108 Gross per 24 hour  Intake 475.5 ml  Output 750 ml  Net -274.5 ml    Physical Exam: General: Alert, awake, oriented x3, in no acute distress.  HEENT: Ward/AT PEERL, EOMI Neck: Trachea midline,  no masses, no thyromegal,y no JVD, no carotid bruit OROPHARYNX:  Moist, No exudate/ erythema/lesions.  Heart: Regular rate and rhythm, without murmurs, rubs, gallops, PMI non-displaced, no heaves or thrills on palpation.  Lungs: Clear to auscultation, no wheezing or rhonchi noted. No increased vocal fremitus resonant to percussion  Abdomen: Soft, nontender, nondistended, positive bowel sounds, no masses no hepatosplenomegaly noted..  Neuro: No focal neurological deficits noted cranial nerves II through XII grossly intact. DTRs 2+ bilaterally upper and lower extremities. Strength  5 out of 5 in bilateral upper and lower extremities. Musculoskeletal: Generalize body tenderness Psychiatric: Patient alert and oriented x3, good insight and cognition, good recent to remote recall. Lymph node survey: No cervical axillary or inguinal lymphadenopathy noted.  Lab Results:  Basic Metabolic Panel:    Component Value Date/Time   NA 141 03/20/2024 0430   NA 140 01/12/2023 1607   K 3.9 03/20/2024 0430   CL 107 03/20/2024 0430   CO2 26 03/20/2024 0430   BUN 9 03/20/2024 0430   BUN 8 01/12/2023 1607   CREATININE 0.87 03/20/2024 0430   GLUCOSE 93 03/20/2024 0430   CALCIUM  8.0 (L) 03/20/2024 0430   CBC:    Component Value Date/Time   WBC 15.3 (H) 03/20/2024 0430   HGB 7.4 (L) 03/20/2024 0430   HGB 7.4 (L) 01/12/2023 1607   HCT 21.4 (L) 03/20/2024 0430   HCT 21.7 (L) 01/12/2023 1607   PLT 289 03/20/2024 0430   PLT 321 01/12/2023 1607   MCV 96.8 03/20/2024 0430   MCV 94 01/12/2023 1607   NEUTROABS 11.2 (H) 03/20/2024 0430   NEUTROABS 3.8 01/12/2023 1607   LYMPHSABS 2.8 03/20/2024 0430   LYMPHSABS 5.6 (H) 01/12/2023 1607   MONOABS 1.1 (H) 03/20/2024 0430   EOSABS 0.1 03/20/2024 0430   EOSABS 0.2 01/12/2023 1607   BASOSABS 0.0 03/20/2024 0430   BASOSABS 0.1 01/12/2023 1607    No results found for this or any previous visit (from the past 240 hours).  Studies/Results: No results found.  Medications: Scheduled Meds:  abacavir -dolutegravir -lamiVUDine   1 tablet Oral QHS   amoxicillin -clavulanate  1 tablet Oral BID   Chlorhexidine  Gluconate Cloth  6 each Topical Daily   cholecalciferol   5,000 Units Oral Q Tue   dabigatran   150 mg Oral Q12H   folic acid   500 mcg Oral Daily   guaiFENesin   600 mg Oral BID   HYDROmorphone    Intravenous Q4H   hydroxyurea   2,000 mg Oral QHS   miconazole   Topical BID   senna-docusate  1 tablet Oral BID   sodium chloride  flush  10-40 mL Intracatheter Q12H   sodium chloride  flush  10-40 mL Intracatheter Q12H   Continuous  Infusions: PRN Meds:.albuterol , cyclobenzaprine , diphenhydrAMINE , gabapentin , guaiFENesin -dextromethorphan , lip balm, naloxone  **AND** sodium chloride  flush, naloxone , ondansetron , oxyCODONE -acetaminophen  **AND** oxyCODONE , pantoprazole , polyethylene glycol, sodium chloride  flush, sodium chloride  flush  Consultants: Cardiology  Procedures: None  Antibiotics: None  Assessment/Plan: Principal Problem:   Sickle cell disease with crisis (HCC) Active Problems:   Generalized anxiety disorder   GERD (gastroesophageal reflux disease)   HIV (human immunodeficiency virus infection) (HCC)   Acute pulmonary embolism (HCC)   Acute on chronic respiratory failure with hypoxia (HCC)   Acute on chronic anemia   Pulmonary hypertension, primary (HCC)  Hb Sickle Cell Disease with Pain crisis: Continue IVF 0.45% Saline @KVO , continue weight based Dilaudid  PCA, continue oral home pain medications as ordered. Monitor vitals very closely, Re-evaluate pain scale regularly, 2 L of Oxygen  by [place]. Patient encouraged to ambulate on the hallway today.  Leukocytosis: Slightly elevated. Will monitor without antibiotics Anemia of Chronic Disease: Monitor daily CBC hgb is within patients baseline. No clinical indication for blood transfusion.  Chronic pain Syndrome: Continue oral home pain medication  Generalized anxiety disorder: Continue other supportive care GERD: Continue PPIs HIV disease: Continue home regimen Severe Pulmonary Hypertension:, lasix  completed. Cardiac consult pending.   Code Status: Full Code Family Communication: N/A Disposition Plan: Not yet ready for discharge  [name] Cover NP  If 7PM-7AM, please contact night-coverage.  03/20/2024, 11:57 AM  LOS: 2 days

## 2024-03-21 DIAGNOSIS — I270 Primary pulmonary hypertension: Secondary | ICD-10-CM | POA: Diagnosis present

## 2024-03-21 DIAGNOSIS — I340 Nonrheumatic mitral (valve) insufficiency: Secondary | ICD-10-CM

## 2024-03-21 DIAGNOSIS — J9621 Acute and chronic respiratory failure with hypoxia: Secondary | ICD-10-CM

## 2024-03-21 DIAGNOSIS — I361 Nonrheumatic tricuspid (valve) insufficiency: Secondary | ICD-10-CM

## 2024-03-21 DIAGNOSIS — I272 Pulmonary hypertension, unspecified: Secondary | ICD-10-CM

## 2024-03-21 DIAGNOSIS — R0789 Other chest pain: Secondary | ICD-10-CM

## 2024-03-21 DIAGNOSIS — D5700 Hb-SS disease with crisis, unspecified: Principal | ICD-10-CM

## 2024-03-21 NOTE — Plan of Care (Signed)
  Problem: Education: Goal: Knowledge of General Education information will improve Description: Including pain rating scale, medication(s)/side effects and non-pharmacologic comfort measures Outcome: Progressing   Problem: Health Behavior/Discharge Planning: Goal: Ability to manage health-related needs will improve Outcome: Progressing   Problem: Clinical Measurements: Goal: Ability to maintain clinical measurements within normal limits will improve Outcome: Progressing Goal: Will remain free from infection Outcome: Progressing Goal: Diagnostic test results will improve Outcome: Progressing Goal: Respiratory complications will improve Outcome: Progressing Goal: Cardiovascular complication will be avoided Outcome: Progressing   Problem: Activity: Goal: Risk for activity intolerance will decrease Outcome: Progressing   Problem: Nutrition: Goal: Adequate nutrition will be maintained Outcome: Progressing   Problem: Elimination: Goal: Will not experience complications related to bowel motility Outcome: Progressing Goal: Will not experience complications related to urinary retention Outcome: Progressing   Problem: Skin Integrity: Goal: Risk for impaired skin integrity will decrease Outcome: Progressing

## 2024-03-21 NOTE — Consult Note (Signed)
 Cardiology Consultation   Patient ID: Martin [name] MRN: [id number]; DOB: 02/01/1991  Admit date: 03/19/2024 Date of Consult: 03/21/2024  PCP:  [name], [name], [name]   [facility] Providers Cardiologist:  None        Patient Profile: Martin [name] is a 33 y.o. male with a hx of hx of SS disease with history of acute chest syndrome and multiple SS crises, hx of PE on xarelto , chronic respiratory failure on home oxygen , HIV, and GAD  who is being seen 03/21/2024 for the evaluation of possible RHC at the request of Martin Fuss MD.  History of Present Illness: Martin [name] was seen by [facility] during his last admission 01/2024 for RV failure. He had been admitted at that time for a sickle cell crisis with acute on chronic PE.Echo had shown  LV EF 65 to 70%. Severely enlarged RV with mildly reduced systolic function.  Severely elevated PASP 94.9 mmHg. Moderate MR.  Moderate to severe TR. Patient was scheduled to follow up with the advanced heart failure team outpatient on 11/12 to discuss possible RHC, though patient did not show up to this appointment as he was admitted for another sickle cell crisis c/b by acute chest syndrome and acute on chronic anemia requiring blood transfusion.   Patient presented to the ED on 11/30 for a sickle cell crisis. BP has been low-normal this admission with most recent 110/82.  No ECG or CXR obtained this admission.   Pertinent lab work: WBC 11.7 -> 15.3 with left shift Hgb ~7.5 [Baseline]  He has received oral antibiotics, one dose of oral lasix  10 mg, and pain medications. Minimal IV fluids. Patient noted to have bilateral peripheral edema on admission that has now resolved. Cardiology was consulted for possible RHC.   On interview, patient reports DOE and mild peripheral edema though improved. No shortness of breath at rest, orthopnea, PND, or abdominal distention.  Past Medical History:  Diagnosis Date   Anxiety     Chest pain 02/04/2024   HIV (human immunodeficiency virus infection) (HCC)    Proteinuria    Sickle cell disease (HCC)    Vitamin D  deficiency 10/2018    Past Surgical History:  Procedure Laterality Date   IR IMAGING GUIDED PORT INSERTION  08/29/2019   IR REMOVAL TUN ACCESS W/ PORT W/O FL MOD SED  08/30/2020   TEE WITHOUT CARDIOVERSION N/A 08/30/2020   Procedure: TRANSESOPHAGEAL ECHOCARDIOGRAM (TEE);  Surgeon: Mona Vinie BROCKS, MD;  Location[facility] ENDOSCOPY;  Service: Cardiovascular;  Laterality: N/A;       Scheduled Meds:  abacavir -dolutegravir -lamiVUDine   1 tablet Oral QHS   amoxicillin -clavulanate  1 tablet Oral BID   Chlorhexidine  Gluconate Cloth  6 each Topical Daily   cholecalciferol   5,000 Units Oral Q Tue   dabigatran   150 mg Oral Q12H   folic acid   500 mcg Oral Daily   guaiFENesin   600 mg Oral BID   HYDROmorphone    Intravenous Q4H   hydroxyurea   2,000 mg Oral QHS   miconazole   Topical BID   senna-docusate  1 tablet Oral BID   sodium chloride  flush  10-40 mL Intracatheter Q12H   sodium chloride  flush  10-40 mL Intracatheter Q12H   Continuous Infusions:  PRN Meds: albuterol , cyclobenzaprine , diphenhydrAMINE , gabapentin , guaiFENesin -dextromethorphan , lip balm, naloxone  **AND** sodium chloride  flush, naloxone , ondansetron , oxyCODONE -acetaminophen  **AND** oxyCODONE , pantoprazole , polyethylene glycol, sodium chloride  flush, sodium chloride  flush  Allergies:    Allergies  Allergen Reactions   Ketorolac  Tromethamine  Swelling and  Other (See Comments)    Patient reports facial edema and left arm edema after administration.   Tape Rash and Other (See Comments)    PLEASE DO NOT USE THE CLEAR, THICK, PLASTIC TAPE- only paper tape is tolerated    Wound Dressing Adhesive Rash   Bactoshield Chg [Chlorhexidine  Gluconate] Other (See Comments)    Per patient it makes me breakout    Social History:   Social History   Socioeconomic History   Marital status: Single     Spouse name: Not on file   Number of children: Not on file   Years of education: Not on file   Highest education level: 12th grade  Occupational History   Occupation: disabled  Tobacco Use   Smoking status: Never   Smokeless tobacco: Never  Vaping Use   Vaping status: Never Used  Substance and Sexual Activity   Alcohol  use: No   Drug use: Not Currently    Types: Marijuana    Comment: 2x week   Sexual activity: Yes    Birth control/protection: Condom  Other Topics Concern   Not on file  Social History Narrative   Lives with his siblings.    Social Drivers of Corporate Investment Banker Strain: Low Risk  (02/21/2024)   Overall Financial Resource Strain (CARDIA)    Difficulty of Paying Living Expenses: Not very hard  Food Insecurity: Food Insecurity Present (03/19/2024)   Hunger Vital Sign    Worried About Running Out of Food in the Last Year: Sometimes true    Ran Out of Food in the Last Year: Sometimes true  Transportation Needs: No Transportation Needs (03/19/2024)   PRAPARE - Administrator, Civil Service (Medical): No    Lack of Transportation (Non-Medical): No  Physical Activity: Sufficiently Active (10/09/2023)   Received from [facility]   Exercise Vital Sign    On average, how many days per week do you engage in moderate to strenuous exercise (like a brisk walk)?: 7 days    On average, how many minutes do you engage in exercise at this level?: 60 min  Stress: No Stress Concern Present (10/09/2023)   Received from [facility] - Occupational Stress Questionnaire    Feeling of Stress : Not at all  Social Connections: Socially Isolated (03/04/2024)   Social Connection and Isolation Panel    Frequency of Communication with Friends and Family: More than three times a week    Frequency of Social Gatherings with Friends and Family: Twice a week    Attends Religious Services: Never    Database Administrator or  Organizations: No    Attends Banker Meetings: Never    Marital Status: Never married  Intimate Partner Violence: Not At Risk (03/19/2024)   Humiliation, Afraid, Rape, and Kick questionnaire    Fear of Current or Ex-Partner: No    Emotionally Abused: No    Physically Abused: No    Sexually Abused: No    Family History:   Family History  Problem Relation Age of Onset   Sickle cell trait Mother    Sickle cell trait Father    Birth defects Maternal Grandmother    Birth defects Paternal Grandmother      ROS:  Please see the history of present illness.  All other ROS reviewed and negative.     Physical Exam/Data: Vitals:   03/21/24 9766 03/21/24 0412 03/21/24 0644 03/21/24 0954  BP: 107/82  116/81 110/82  Pulse: (!) 105  90 84  Resp: 18 16 18 12   Temp: 97.8 F (36.6 C)  98.1 F (36.7 C) 98.7 F (37.1 C)  TempSrc: Oral  Oral Oral  SpO2: 95% 97% 93% 96%  Weight:      Height:        Intake/Output Summary (Last 24 hours) at 03/21/2024 1054 Last data filed at 03/21/2024 1018 Gross per 24 hour  Intake 475.5 ml  Output 450 ml  Net 25.5 ml      03/19/2024    4:29 PM 03/17/2024    6:31 AM 03/05/2024    7:03 AM  Last 3 Weights  Weight (lbs) 141 lb 5 oz 140 lb 139 lb 15.9 oz  Weight (kg) 64.1 kg 63.504 kg 63.5 kg     Body mass index is 17.66 kg/m.  General:  Well nourished, well developed, in no acute distress HEENT: normal Neck: no JVD Cardiac:  regular rhythm, tachycardic, no murmur Lungs:  clear to auscultation bilaterally, no wheezing, rhonchi or rales  Abd: soft, nontender, no hepatomegaly  Ext: no edema Musculoskeletal:  No deformities Skin: warm and dry  Psych:  Normal affect   EKG:  The EKG was personally reviewed and demonstrates:  none this admission, will obtain.  Telemetry:  Telemetry was personally reviewed and demonstrates:  sinus rhythm HR ~95  Relevant CV Studies: Echocardiogram 02/09/24 IMPRESSIONS     1. Left ventricular  ejection fraction, by estimation, is 65 to 70%. The  left ventricle has normal function. The left ventricle has no regional  wall motion abnormalities. There is mild concentric left ventricular  hypertrophy. Left ventricular diastolic  parameters were normal. There is the interventricular septum is flattened  in systole and diastole, consistent with right ventricular pressure and  volume overload.   2. Right ventricular systolic function is mildly reduced. The right  ventricular size is severely enlarged. There is severely elevated  pulmonary artery systolic pressure. The estimated right ventricular  systolic pressure is 94.9 mmHg.   3. Right atrial size was severely dilated.   4. The mitral valve is abnormal. Moderate mitral valve regurgitation. No  evidence of mitral stenosis.   5. Tricuspid valve regurgitation is moderate to severe.   6. The aortic valve is tricuspid. Aortic valve regurgitation is not  visualized. No aortic stenosis is present.   7. The inferior vena cava is dilated in size with <50% respiratory  variability, suggesting right atrial pressure of 15 mmHg.   Comparison(s): Prior images reviewed side by side. Changes from prior  study are noted. RV enlarged, RVSP higher compared to prior--now severely  dilated RV with RVSP 95 mmHg. Findings communicated to Dr. Jegede.   Laboratory Data:  Chemistry Recent Labs  Lab 03/17/24 0627 03/19/24 0535 03/20/24 0430  NA 140 141 141  K 3.5 3.2* 3.9  CL 107 108 107  CO2 25 23 26   GLUCOSE 96 108* 93  BUN 12 12 9   CREATININE 1.02 0.86 0.87  CALCIUM  8.4* 7.8* 8.0*  GFRNONAA >60 >60 >60  ANIONGAP 8 11 8     Recent Labs  Lab 03/17/24 0627 03/19/24 0535 03/20/24 0430  PROT 7.4 7.2 7.0  ALBUMIN 3.5 3.5 3.5  AST 32 37 36  ALT 52* 41 37  ALKPHOS 75 77 73  BILITOT 2.5* 3.5* 4.3*   Hematology Recent Labs  Lab 03/17/24 0627 03/19/24 0535 03/19/24 0536 03/20/24 0430  WBC 9.5 11.7*  --  15.3*  RBC 2.11*  2.12*  2.16* 2.20* 2.21*  HGB 7.3* 7.4*  --  7.4*  HCT 21.5* 21.6*  --  21.4*  MCV 101.9* 100.0  --  96.8  MCH 34.6* 34.3*  --  33.5  MCHC 34.0 34.3  --  34.6  RDW 29.3* 26.3*  --  24.1*  PLT 341 290  --  289    Assessment and Plan:  RV failure  Hx of PE on chronic anticoagulation  Noted during 01/2024 admission, patient was to follow up with AHF team though was admitted for a SS crisis during that time.   Cardiology consulted this admission to assess for inpatient RHC. At this time no urgent indication for inpatient RHC. 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 He does warrant a RHC for further evaluation at some point. Originally plan was to obtain outpatient, though patient has been in and out of the hospital the last month preventing further work-up. Will discuss with MD about possible RHC this admission.  .  On exam, patient appears euvolemic. Given his low normal BP and known RV failure would avoid over diuresis, and would caution with IV fluids.  Continue pradaxa   Moderate MR Moderate to severe TR Noted on echo last admission, most likely 2/2 to above.  Continue to monitor.   Per primary  SS crisis  Leukocytosis Chronic pain HIV GAD   Risk Assessment/Risk Scores:       For questions or updates, please contact [facility] Please consult [email or web address] for contact info under      Signed, Leontine LOISE Salen, PA-C  03/21/2024 10:54 AM

## 2024-03-21 NOTE — Plan of Care (Signed)

## 2024-03-21 NOTE — Progress Notes (Addendum)
 Patient ID: Martin [name], male   DOB: 08/29/1990, 33 y.o.   MRN: [id number] Subjective: Martin [name]  is a 33 y.o. male with history of sickle cell disease, history of acute chest syndrome, multiple sickle cell crisis, multiple ED visits and hospital admission, history of PE currently on Xerelto, chronic hypoxic respiratory failure on home oxygen , HIV infection and generalized anxiety disorder who presented to the ED with major complaints of generalized body pain, lower back pain, and bilateral hip pain that is consistent with his typical sickle cell pain crisis.   Patient endorses pain of 8/10 this morning. He is gradually improving. He has no new concerns. He denies cough, subject fever,  No N/V/D. No urinary symptoms.    Objective:  Vital signs in last 24 hours:  Vitals:   03/21/24 0412 03/21/24 0644 03/21/24 0800 03/21/24 0954  BP:  116/81  110/82  Pulse:  90  84  Resp: 16 18 19 12   Temp:  98.1 F (36.7 C)  98.7 F (37.1 C)  TempSrc:  Oral  Oral  SpO2: 97% 93% 94% 96%  Weight:      Height:        Intake/Output from previous day:   Intake/Output Summary (Last 24 hours) at 03/21/2024 1156 Last data filed at 03/21/2024 1108 Gross per 24 hour  Intake 475.5 ml  Output 750 ml  Net -274.5 ml    Physical Exam: General: Alert, awake, oriented x3, in no acute distress.  HEENT: [place]/AT [place]ERL, EOMI Neck: Trachea midline,  no masses, no thyromegal,y no JVD, no carotid bruit OROPHARYNX:  Moist, No exudate/ erythema/lesions.  Heart: Regular rate and rhythm, without murmurs, rubs, gallops, PMI non-displaced, no heaves or thrills on palpation.  Lungs: Clear to auscultation, no wheezing or rhonchi noted. No increased vocal fremitus resonant to percussion  Abdomen: Soft, nontender, nondistended, positive bowel sounds, no masses no hepatosplenomegaly noted..  Neuro: No focal neurological deficits noted cranial nerves II through XII grossly intact. DTRs 2+ bilaterally upper and  lower extremities. Strength 5 out of 5 in bilateral upper and lower extremities. Musculoskeletal: Generalize body tenderness Psychiatric: Patient alert and oriented x3, good insight and cognition, good recent to remote recall. Lymph node survey: No cervical axillary or inguinal lymphadenopathy noted.  Lab Results:  Basic Metabolic Panel:    Component Value Date/Time   NA 141 03/20/2024 0430   NA 140 01/12/2023 1607   K 3.9 03/20/2024 0430   CL 107 03/20/2024 0430   CO2 26 03/20/2024 0430   BUN 9 03/20/2024 0430   BUN 8 01/12/2023 1607   CREATININE 0.87 03/20/2024 0430   GLUCOSE 93 03/20/2024 0430   CALCIUM  8.0 (L) 03/20/2024 0430   CBC:    Component Value Date/Time   WBC 15.3 (H) 03/20/2024 0430   HGB 7.4 (L) 03/20/2024 0430   HGB 7.4 (L) 01/12/2023 1607   HCT 21.4 (L) 03/20/2024 0430   HCT 21.7 (L) 01/12/2023 1607   PLT 289 03/20/2024 0430   PLT 321 01/12/2023 1607   MCV 96.8 03/20/2024 0430   MCV 94 01/12/2023 1607   NEUTROABS 11.2 (H) 03/20/2024 0430   NEUTROABS 3.8 01/12/2023 1607   LYMPHSABS 2.8 03/20/2024 0430   LYMPHSABS 5.6 (H) 01/12/2023 1607   MONOABS 1.1 (H) 03/20/2024 0430   EOSABS 0.1 03/20/2024 0430   EOSABS 0.2 01/12/2023 1607   BASOSABS 0.0 03/20/2024 0430   BASOSABS 0.1 01/12/2023 1607    No results found for this or any previous visit (from the  past 240 hours).  Studies/Results: No results found.  Medications: Scheduled Meds:  abacavir -dolutegravir -lamiVUDine   1 tablet Oral QHS   amoxicillin -clavulanate  1 tablet Oral BID   Chlorhexidine  Gluconate Cloth  6 each Topical Daily   cholecalciferol   5,000 Units Oral Q Tue   dabigatran   150 mg Oral Q12H   folic acid   500 mcg Oral Daily   guaiFENesin   600 mg Oral BID   HYDROmorphone    Intravenous Q4H   hydroxyurea   2,000 mg Oral QHS   miconazole   Topical BID   senna-docusate  1 tablet Oral BID   sodium chloride  flush  10-40 mL Intracatheter Q12H   sodium chloride  flush  10-40 mL Intracatheter  Q12H   Continuous Infusions: PRN Meds:.albuterol , cyclobenzaprine , diphenhydrAMINE , gabapentin , guaiFENesin -dextromethorphan , lip balm, naloxone  **AND** sodium chloride  flush, naloxone , ondansetron , oxyCODONE -acetaminophen  **AND** oxyCODONE , pantoprazole , polyethylene glycol, sodium chloride  flush, sodium chloride  flush  Consultants: Cardiology (Pending)   Procedures: None  Antibiotics: None  Assessment/Plan: Principal Problem:   Sickle cell disease with crisis (HCC) Active Problems:   Generalized anxiety disorder   GERD (gastroesophageal reflux disease)   HIV (human immunodeficiency virus infection) (HCC)   Acute pulmonary embolism (HCC)   Acute on chronic respiratory failure with hypoxia (HCC)   Acute on chronic anemia   Pulmonary hypertension, primary (HCC)  Hb Sickle Cell Disease with Pain crisis: Continue IVF 0.45% Saline KVO,  continue weight based Dilaudid  PCA, continue oral home pain medications as ordered. Monitor vitals very closely, Re-evaluate pain scale regularly, 2 L of Oxygen  by [place]. Patient encouraged to ambulate on the hallway today.  Leukocytosis: Slightly elevated. Will monitor without antibiotics  Anemia of Chronic Disease: Monitor daily CBC hgb is within patients baseline. No clinical indication for blood transfusion.  Chronic pain Syndrome:  Monitor daily CBC hgb is within patients  Generalized anxiety disorder: Continue other supportive care GERD: Continue PPIs HIV disease: Continue home regimen Severe Pulmonary Hypertension:, lasix  completed. Cardiac consult pending.   Code Status: Full Code Family Communication: N/A Disposition Plan: Not yet ready for discharge  [name] Cover NP  If 7PM-7AM, please contact night-coverage.  03/21/2024, 11:56 AM  LOS: 2 days

## 2024-03-22 NOTE — Plan of Care (Signed)

## 2024-03-22 NOTE — Plan of Care (Signed)

## 2024-03-22 NOTE — Progress Notes (Cosign Needed Addendum)
 Patient ID: Martin [name], male   DOB: 01-25-1991, 33 y.o.   MRN: [id number] Subjective: Martin [name]  is a 33 y.o. male with history of sickle cell disease, history of acute chest syndrome, multiple sickle cell crisis, multiple ED visits and hospital admission, history of PE currently on Xerelto, chronic hypoxic respiratory failure on home oxygen , HIV infection and generalized anxiety disorder who presented to the ED with major complaints of generalized body pain, lower back pain, and bilateral hip pain that is consistent with his typical sickle cell pain crisis.   Patient endorses pain of 8/10 this morning. He has no new concerns. He denies cough, subject fever,  No N/V/D. No urinary symptoms.    Objective:  Vital signs in last 24 hours:  Vitals:   03/24/24 0425 03/24/24 0428 03/24/24 0736 03/24/24 1024  BP: 108/79   99/75  Pulse: (!) 101   (!) 108  Resp:  16 18 16   Temp: 98.3 F (36.8 C)   98.2 F (36.8 C)  TempSrc: Oral   Oral  SpO2: (!) 76% 92% 92% 93%  Weight:      Height:        Intake/Output from previous day:   Intake/Output Summary (Last 24 hours) at 03/24/2024 1106 Last data filed at 03/24/2024 1024 Gross per 24 hour  Intake 360 ml  Output 3800 ml  Net -3440 ml    Physical Exam: General: Alert, awake, oriented x3, in no acute distress.  HEENT: [place]/AT [place]ERL, EOMI Neck: Trachea midline,  no masses, no thyromegal,y no JVD, no carotid bruit OROPHARYNX:  Moist, No exudate/ erythema/lesions.  Heart: Bilateral lower extremity non pitting edema Lungs: SOB Abdomen: Soft, nontender, nondistended, positive bowel sounds, no masses no hepatosplenomegaly noted..  Neuro: No focal neurological deficits noted cranial nerves II through XII grossly intact. DTRs 2+ bilaterally upper and lower extremities. Strength 5 out of 5 in bilateral upper and lower extremities. Musculoskeletal: Generalize body tenderness Psychiatric: Patient alert and oriented x3, good insight and  cognition, good recent to remote recall. Lymph node survey: No cervical axillary or inguinal lymphadenopathy noted.  Lab Results:  Basic Metabolic Panel:    Component Value Date/Time   NA 141 03/20/2024 0430   NA 140 01/12/2023 1607   K 3.9 03/20/2024 0430   CL 107 03/20/2024 0430   CO2 26 03/20/2024 0430   BUN 9 03/20/2024 0430   BUN 8 01/12/2023 1607   CREATININE 0.87 03/20/2024 0430   GLUCOSE 93 03/20/2024 0430   CALCIUM  8.0 (L) 03/20/2024 0430   CBC:    Component Value Date/Time   WBC 15.3 (H) 03/20/2024 0430   HGB 7.4 (L) 03/20/2024 0430   HGB 7.4 (L) 01/12/2023 1607   HCT 21.4 (L) 03/20/2024 0430   HCT 21.7 (L) 01/12/2023 1607   PLT 289 03/20/2024 0430   PLT 321 01/12/2023 1607   MCV 96.8 03/20/2024 0430   MCV 94 01/12/2023 1607   NEUTROABS 11.2 (H) 03/20/2024 0430   NEUTROABS 3.8 01/12/2023 1607   LYMPHSABS 2.8 03/20/2024 0430   LYMPHSABS 5.6 (H) 01/12/2023 1607   MONOABS 1.1 (H) 03/20/2024 0430   EOSABS 0.1 03/20/2024 0430   EOSABS 0.2 01/12/2023 1607   BASOSABS 0.0 03/20/2024 0430   BASOSABS 0.1 01/12/2023 1607    No results found for this or any previous visit (from the past 240 hours).  Studies/Results: No results found.  Medications: Scheduled Meds:  abacavir -dolutegravir -lamiVUDine   1 tablet Oral QHS   Chlorhexidine  Gluconate Cloth  6 each  Topical Daily   cholecalciferol   5,000 Units Oral Q Tue   dabigatran   150 mg Oral Q12H   folic acid   500 mcg Oral Daily   guaiFENesin   600 mg Oral BID   HYDROmorphone    Intravenous Q4H   hydroxyurea   2,000 mg Oral QHS   miconazole    Topical BID   senna-docusate  1 tablet Oral BID   sodium chloride  flush  10-40 mL Intracatheter Q12H   sodium chloride  flush  10-40 mL Intracatheter Q12H   Continuous Infusions: PRN Meds:.albuterol , cyclobenzaprine , diphenhydrAMINE , gabapentin , guaiFENesin -dextromethorphan , lip balm, naloxone  **AND** sodium chloride  flush, naloxone , ondansetron , oxyCODONE -acetaminophen  **AND**  oxyCODONE , pantoprazole , polyethylene glycol, sodium chloride  flush, sodium chloride  flush  Consultants: Cardiology (completed)  Procedures: None  Antibiotics: None  Assessment/Plan: Principal Problem:   Sickle cell disease with crisis (HCC) Active Problems:   Generalized anxiety disorder   GERD (gastroesophageal reflux disease)   HIV (human immunodeficiency virus infection) (HCC)   Acute pulmonary embolism (HCC)   Acute on chronic respiratory failure with hypoxia (HCC)   Acute on chronic anemia   Pulmonary hypertension, primary (HCC)  Hb Sickle Cell Disease with Pain crisis: Continue IVF 0.45% Saline KVO,  continue weight based Dilaudid  PCA, continue oral home pain medications as ordered. Monitor vitals very closely, Re-evaluate pain scale regularly, 2 L of Oxygen  by [place]. Patient encouraged to ambulate on the hallway today.  Leukocytosis: Slightly elevated. Will monitor without antibiotics  Anemia of Chronic Disease: Hgb is within patients baseline. No clinical indication for blood transfusion.  Chronic pain Syndrome:  Continue oral home pain medication Generalized anxiety disorder: stable, denies suicidal ideations, continue supportive treatment and follow up with PCP as scheduled.  GERD: Continue PPIs HIV disease: Continue home regimen Severe Pulmonary Hypertension:, lasix  completed. Cardiac consult completed: Vinie KYM Maxcy, MD  reports that there is no clear role for inpatient RHC - mechanism of pulmonary hypertension is known. Appears euvolemic on exam. Pulmonary pressure during exacerbations may not be indicative of baseline - best to consider outpatient RHC with advanced HF service. Agree with current management recommendations. Will arrange for outpatient follow-up with heart failure service    Code Status: Full Code Family Communication: N/A Disposition Plan: Not yet ready for discharge  [name] Cover NP  If 7PM-7AM, please contact night-coverage.  03/22/2024,  11:06 AM  LOS: 5 days

## 2024-03-23 NOTE — Plan of Care (Signed)
   Problem: Education: Goal: Knowledge of General Education information will improve Description: Including pain rating scale, medication(s)/side effects and non-pharmacologic comfort measures Outcome: Progressing   Problem: Activity: Goal: Risk for activity intolerance will decrease Outcome: Progressing   Problem: Nutrition: Goal: Adequate nutrition will be maintained Outcome: Progressing   Problem: Coping: Goal: Level of anxiety will decrease Outcome: Progressing

## 2024-03-23 NOTE — Progress Notes (Addendum)
 Patient ID: Martin [name], male   DOB: [birth year], 33 y.o.   MRN: [id number] Subjective: Martin [name]  is a 33 y.o. male with history of sickle cell disease, history of acute chest syndrome, multiple sickle cell crisis, multiple ED visits and hospital admission, history of PE currently on Xerelto, chronic hypoxic respiratory failure on home oxygen , HIV infection and generalized anxiety disorder who presented to the ED with major complaints of generalized body pain, lower back pain, and bilateral hip pain that is consistent with his typical sickle cell pain crisis.   Patient endorses pain of 4/10 this morning. He is gradually improving,  has no new concerns. He denies cough, subject fever,  No N/V/D. No urinary symptoms.    Objective:  Vital signs in last 24 hours:  Vitals:   03/24/24 0425 03/24/24 0428 03/24/24 0736 03/24/24 1024  BP: 108/79   99/75  Pulse: (!) 101   (!) 108  Resp:  16 18 16   Temp: 98.3 F (36.8 C)   98.2 F (36.8 C)  TempSrc: Oral   Oral  SpO2: (!) 76% 92% 92% 93%  Weight:      Height:        Intake/Output from previous day:   Intake/Output Summary (Last 24 hours) at 03/24/2024 1105 Last data filed at 03/24/2024 1024 Gross per 24 hour  Intake 360 ml  Output 3800 ml  Net -3440 ml    Physical Exam: General: Alert, awake, oriented x3, in no acute distress.  HEENT: [place]/AT [place]ER[place], EOMI Neck: Trachea midline,  no masses, no thyromegal,y no JVD, no carotid bruit OROPHARYNX:  Moist, No exudate/ erythema/lesions.  Heart: Bilateral lower extremity edema  Lungs: SOB Abdomen: Soft, nontender, nondistended, positive bowel sounds, no masses no hepatosplenomegaly noted..  Neuro: No focal neurological deficits noted cranial nerves II through XII grossly intact. DTRs 2+ bilaterally upper and lower extremities. Strength 5 out of 5 in bilateral upper and lower extremities. Musculoskeletal: Generalize body tenderness Psychiatric: Patient alert and oriented x3,  good insight and cognition, good recent to remote recall. Lymph node survey: No cervical axillary or inguinal lymphadenopathy noted.  Lab Results:  Basic Metabolic Panel:    Component Value Date/Time   NA 141 03/20/2024 0430   NA 140 01/12/2023 1607   K 3.9 03/20/2024 0430   CL 107 03/20/2024 0430   CO2 26 03/20/2024 0430   BUN 9 03/20/2024 0430   BUN 8 01/12/2023 1607   CREATININE 0.87 03/20/2024 0430   GLUCOSE 93 03/20/2024 0430   CALCIUM  8.0 (L) 03/20/2024 0430   CBC:    Component Value Date/Time   WBC 15.3 (H) 03/20/2024 0430   HGB 7.4 (L) 03/20/2024 0430   HGB 7.4 (L) 01/12/2023 1607   HCT 21.4 (L) 03/20/2024 0430   HCT 21.7 (L) 01/12/2023 1607   PLT 289 03/20/2024 0430   PLT 321 01/12/2023 1607   MCV 96.8 03/20/2024 0430   MCV 94 01/12/2023 1607   NEUTROABS 11.2 (H) 03/20/2024 0430   NEUTROABS 3.8 01/12/2023 1607   LYMPHSABS 2.8 03/20/2024 0430   LYMPHSABS 5.6 (H) 01/12/2023 1607   MONOABS 1.1 (H) 03/20/2024 0430   EOSABS 0.1 03/20/2024 0430   EOSABS 0.2 01/12/2023 1607   BASOSABS 0.0 03/20/2024 0430   BASOSABS 0.1 01/12/2023 1607    No results found for this or any previous visit (from the past 240 hours).  Studies/Results: No results found.  Medications: Scheduled Meds:  abacavir -dolutegravir -lamiVUDine   1 tablet Oral QHS   Chlorhexidine  Gluconate Cloth  6 each Topical Daily   cholecalciferol   5,000 Units Oral Q Tue   dabigatran   150 mg Oral Q12H   folic acid   500 mcg Oral Daily   guaiFENesin   600 mg Oral BID   HYDROmorphone    Intravenous Q4H   hydroxyurea   2,000 mg Oral QHS   miconazole    Topical BID   senna-docusate  1 tablet Oral BID   sodium chloride  flush  10-40 mL Intracatheter Q12H   sodium chloride  flush  10-40 mL Intracatheter Q12H   Continuous Infusions: PRN Meds:.albuterol , cyclobenzaprine , diphenhydrAMINE , gabapentin , guaiFENesin -dextromethorphan , lip balm, naloxone  **AND** sodium chloride  flush, naloxone , ondansetron ,  oxyCODONE -acetaminophen  **AND** oxyCODONE , pantoprazole , polyethylene glycol, sodium chloride  flush, sodium chloride  flush  Consultants: Cardiology (completed)  Procedures: None  Antibiotics: None  Assessment/Plan: Principal Problem:   Sickle cell disease with crisis (HCC) Active Problems:   Generalized anxiety disorder   GERD (gastroesophageal reflux disease)   HIV (human immunodeficiency virus infection) (HCC)   Acute pulmonary embolism (HCC)   Acute on chronic respiratory failure with hypoxia (HCC)   Acute on chronic anemia   Pulmonary hypertension, primary (HCC)  Hb Sickle Cell Disease with Pain crisis: Pain is gradually improving, Continue IVF 0.45% Saline KVO,  continue weight based Dilaudid  PCA, continue oral home pain medications as ordered. Monitor vitals very closely, Re-evaluate pain scale regularly, 2 L of Oxygen  by [place]. Patient encouraged to ambulate on the hallway today.  Leukocytosis: Slightly elevated. Will monitor without antibiotics  Anemia of Chronic Disease: Monitor daily CBC. HGB is within patients baseline. No clinical indication for blood transfusion.  Chronic pain Syndrome: Continue oral pain medication  Generalized anxiety disorder: Stable denies suicidal ideations, Continue other supportive care GERD: Continue PPIs HIV disease: Continue home regimen Severe Pulmonary Hypertension:, lasix  completed. Cardiac consult completed: Vinie KYM Maxcy, MD  reports that there is no clear role for inpatient RHC - mechanism of pulmonary hypertension is known. Appears euvolemic on exam. Pulmonary pressure during exacerbations may not be indicative of baseline - best to consider outpatient RHC with advanced HF service. Agree with current management recommendations. Will arrange for outpatient follow-up with heart failure service    Code Status: Full Code Family Communication: N/A Disposition Plan: Not yet ready for discharge  [name] Cover NP  If 7PM-7AM, please  contact night-coverage.  03/23/2024, 11:05 AM  LOS: 5 days

## 2024-03-23 NOTE — Plan of Care (Signed)

## 2024-03-24 MED ORDER — HYDROMORPHONE 1 MG/ML IV SOLN
INTRAVENOUS | Status: DC
  Administered 2024-03-24: 1.8 mg via INTRAVENOUS
  Administered 2024-03-24 (×2): 3 mg via INTRAVENOUS
  Administered 2024-03-25: 6 mg via INTRAVENOUS
  Administered 2024-03-25: 3.9 mg via INTRAVENOUS
  Administered 2024-03-25: 2.4 mg via INTRAVENOUS
  Administered 2024-03-25: 1.5 mg via INTRAVENOUS
  Administered 2024-03-26: 5.1 mg via INTRAVENOUS
  Administered 2024-03-26: 6.3 mg via INTRAVENOUS
  Administered 2024-03-26: 4.5 mg via INTRAVENOUS
  Administered 2024-03-26: 4.2 mg via INTRAVENOUS
  Filled 2024-03-24: qty 30

## 2024-03-24 MED ORDER — BISACODYL 10 MG RE SUPP
10.0000 mg | Freq: Once | RECTAL | Status: AC
  Administered 2024-03-24: 10 mg via RECTAL
  Filled 2024-03-24: qty 1

## 2024-03-24 NOTE — Plan of Care (Signed)

## 2024-03-24 NOTE — Progress Notes (Addendum)
 Patient ID: Martin [name], male   DOB: June 06, 1990, 33 y.o.   MRN: [id number] Subjective: Martin [name]  is a 33 y.o. male with history of sickle cell disease, history of acute chest syndrome, multiple sickle cell crisis, multiple ED visits and hospital admission, history of PE currently on Xerelto, chronic hypoxic respiratory failure on home oxygen , HIV infection and generalized anxiety disorder who presented to the ED with major complaints of generalized body pain, lower back pain, and bilateral hip pain that is consistent with his typical sickle cell pain crisis.   Patient endorses pain of 4/10 this morning. He continues to improve symptomatically however he says his pain is not at baseline and he needs one more day. He denies cough, subject fever,  No N/V/D. No urinary symptoms.    Objective:  Vital signs in last 24 hours:  Vitals:   03/24/24 0425 03/24/24 0428 03/24/24 0736 03/24/24 1024  BP: 108/79   99/75  Pulse: (!) 101   (!) 108  Resp:  16 18 16   Temp: 98.3 F (36.8 C)   98.2 F (36.8 C)  TempSrc: Oral   Oral  SpO2: (!) 76% 92% 92% 93%  Weight:      Height:        Intake/Output from previous day:   Intake/Output Summary (Last 24 hours) at 03/24/2024 1104 Last data filed at 03/24/2024 1024 Gross per 24 hour  Intake 360 ml  Output 3800 ml  Net -3440 ml    Physical Exam: General: Alert, awake, oriented x3, in no acute distress.  HEENT: [place]/AT [place], EOMI Neck: Trachea midline,  no masses, no thyromegal,y no JVD, no carotid bruit OROPHARYNX:  Moist, No exudate/ erythema/lesions.  Heart: Bilateral lower extremity Edema  Lungs:SOB Abdomen: Soft, nontender, nondistended, positive bowel sounds, no masses no hepatosplenomegaly noted..  Neuro: No focal neurological deficits noted cranial nerves II through XII grossly intact. DTRs 2+ bilaterally upper and lower extremities. Strength 5 out of 5 in bilateral upper and lower extremities. Musculoskeletal: Generalize  body tenderness Psychiatric: Patient alert and oriented x3, good insight and cognition, good recent to remote recall. Lymph node survey: No cervical axillary or inguinal lymphadenopathy noted.  Lab Results:  Basic Metabolic Panel:    Component Value Date/Time   NA 141 03/20/2024 0430   NA 140 01/12/2023 1607   K 3.9 03/20/2024 0430   CL 107 03/20/2024 0430   CO2 26 03/20/2024 0430   BUN 9 03/20/2024 0430   BUN 8 01/12/2023 1607   CREATININE 0.87 03/20/2024 0430   GLUCOSE 93 03/20/2024 0430   CALCIUM  8.0 (L) 03/20/2024 0430   CBC:    Component Value Date/Time   WBC 15.3 (H) 03/20/2024 0430   HGB 7.4 (L) 03/20/2024 0430   HGB 7.4 (L) 01/12/2023 1607   HCT 21.4 (L) 03/20/2024 0430   HCT 21.7 (L) 01/12/2023 1607   PLT 289 03/20/2024 0430   PLT 321 01/12/2023 1607   MCV 96.8 03/20/2024 0430   MCV 94 01/12/2023 1607   NEUTROABS 11.2 (H) 03/20/2024 0430   NEUTROABS 3.8 01/12/2023 1607   LYMPHSABS 2.8 03/20/2024 0430   LYMPHSABS 5.6 (H) 01/12/2023 1607   MONOABS 1.1 (H) 03/20/2024 0430   EOSABS 0.1 03/20/2024 0430   EOSABS 0.2 01/12/2023 1607   BASOSABS 0.0 03/20/2024 0430   BASOSABS 0.1 01/12/2023 1607    No results found for this or any previous visit (from the past 240 hours).  Studies/Results: No results found.  Medications: Scheduled Meds:  abacavir -dolutegravir -lamiVUDine   1 tablet Oral QHS   Chlorhexidine  Gluconate Cloth  6 each Topical Daily   cholecalciferol   5,000 Units Oral Q Tue   dabigatran   150 mg Oral Q12H   folic acid   500 mcg Oral Daily   guaiFENesin   600 mg Oral BID   HYDROmorphone    Intravenous Q4H   hydroxyurea   2,000 mg Oral QHS   miconazole    Topical BID   senna-docusate  1 tablet Oral BID   sodium chloride  flush  10-40 mL Intracatheter Q12H   sodium chloride  flush  10-40 mL Intracatheter Q12H   Continuous Infusions: PRN Meds:.albuterol , cyclobenzaprine , diphenhydrAMINE , gabapentin , guaiFENesin -dextromethorphan , lip balm, naloxone  **AND**  sodium chloride  flush, naloxone , ondansetron , oxyCODONE -acetaminophen  **AND** oxyCODONE , pantoprazole , polyethylene glycol, sodium chloride  flush, sodium chloride  flush  Consultants: Cardiology (completed)  Procedures: None  Antibiotics: None  Assessment/Plan: Principal Problem:   Sickle cell disease with crisis (HCC) Active Problems:   Generalized anxiety disorder   GERD (gastroesophageal reflux disease)   HIV (human immunodeficiency virus infection) (HCC)   Acute pulmonary embolism (HCC)   Acute on chronic respiratory failure with hypoxia (HCC)   Acute on chronic anemia   Pulmonary hypertension, primary (HCC)  Hb Sickle Cell Disease with Pain crisis: Pain continues to  improve, Continue IVF 0.45% Saline KVO,  titrate PCA to wean off. Continue oral home pain medications as ordered. Monitor vitals very closely, Re-evaluate pain scale regularly, 2 L of Oxygen  by [place]. Patient encouraged to ambulate on the hallway today.  Leukocytosis: Slightly elevated. Will monitor without antibiotics  Anemia of Chronic Disease: Hgb is within patients baseline. No clinical indication for blood transfusion.  Chronic pain Syndrome: Continue oral pain medication  Generalized anxiety disorder: Stable denies suicidal ideations, Continue other supportive care GERD: Continue PPIs HIV disease: Continue home regimen Severe Pulmonary Hypertension:, lasix  completed. Cardiac consult completed: Vinie KYM Maxcy, MD  reports that there is no clear role for inpatient RHC - mechanism of pulmonary hypertension is known. Appears euvolemic on exam. Pulmonary pressure during exacerbations may not be indicative of baseline - best to consider outpatient RHC with advanced HF service. Agrees with current management recommendations. Will arrange for outpatient follow-up with heart failure service.    Code Status: Full Code Family Communication: N/A Disposition Plan: ready for discharge in the AM  [name] Cover NP  If  7PM-7AM, please contact night-coverage.  03/24/2024, 11:04 AM  LOS: 5 days

## 2024-03-25 DIAGNOSIS — D5700 Hb-SS disease with crisis, unspecified: Secondary | ICD-10-CM | POA: Diagnosis not present

## 2024-03-25 DIAGNOSIS — R600 Localized edema: Secondary | ICD-10-CM | POA: Diagnosis not present

## 2024-03-25 LAB — COMPREHENSIVE METABOLIC PANEL WITH GFR
ALT: 35 U/L (ref 0–44)
AST: 45 U/L — ABNORMAL HIGH (ref 15–41)
Albumin: 3.6 g/dL (ref 3.5–5.0)
Alkaline Phosphatase: 74 U/L (ref 38–126)
Anion gap: 7 (ref 5–15)
BUN: 19 mg/dL (ref 6–20)
CO2: 26 mmol/L (ref 22–32)
Calcium: 8.3 mg/dL — ABNORMAL LOW (ref 8.9–10.3)
Chloride: 105 mmol/L (ref 98–111)
Creatinine, Ser: 1.17 mg/dL (ref 0.61–1.24)
GFR, Estimated: >60 mL/min (ref >60)
Glucose, Bld: 109 mg/dL — ABNORMAL HIGH (ref 70–99)
Potassium: 4.3 mmol/L (ref 3.5–5.1)
Sodium: 139 mmol/L (ref 135–145)
Total Bilirubin: 6.2 mg/dL — ABNORMAL HIGH (ref 0.0–1.2)
Total Protein: 7.5 g/dL (ref 6.5–8.1)

## 2024-03-25 LAB — CBC
HCT: 16.1 % — ABNORMAL LOW (ref 39.0–52.0)
Hemoglobin: 6.1 g/dL — CL (ref 13.0–17.0)
MCH: 36.3 pg — ABNORMAL HIGH (ref 26.0–34.0)
MCHC: 37.9 g/dL — ABNORMAL HIGH (ref 30.0–36.0)
MCV: 95.8 fL (ref 80.0–100.0)
Platelets: 344 K/uL (ref 150–400)
RBC: 1.68 MIL/uL — ABNORMAL LOW (ref 4.22–5.81)
RDW: 24.7 % — ABNORMAL HIGH (ref 11.5–15.5)
WBC: 7.7 K/uL (ref 4.0–10.5)
nRBC: 48.3 % — ABNORMAL HIGH (ref 0.0–0.2)

## 2024-03-25 LAB — PREPARE RBC (CROSSMATCH)

## 2024-03-25 MED ORDER — LACTATED RINGERS IV BOLUS
500.0000 mL | Freq: Once | INTRAVENOUS | Status: AC
  Administered 2024-03-25: 500 mL via INTRAVENOUS

## 2024-03-25 MED ORDER — SODIUM CHLORIDE 0.9% IV SOLUTION: Freq: Once | INTRAVENOUS | Status: DC

## 2024-03-25 NOTE — Progress Notes (Signed)
 Overnight cross coverage  Informed by RN regarding critical hemoglobin.  Hemoglobin is 6.1 on morning labs today and was previously 7.4 yesterday.  Patient with history of PE on chronic anticoagulation but not having any overt bleeding.  Admitted for sickle cell pain crisis.  Blood pressure 94/64, heart rate 91.  500 mL fluid bolus ordered.  Type and screen, 1 unit PRBCs ordered after obtaining consent from the patient.  Follow-up posttransfusion H&H and continue to transfuse if hemoglobin is less than 7.

## 2024-03-25 NOTE — Plan of Care (Signed)

## 2024-03-25 NOTE — Progress Notes (Signed)
 Patient ID: Martin [name], male   DOB: [birth year], 33 y.o.   MRN: [id number] Subjective: Martin [name]  is a 33 y.o. male with history of sickle cell disease, history of acute chest syndrome, multiple sickle cell crisis, multiple ED visits and hospital admission, history of PE currently on Xerelto, chronic hypoxic respiratory failure on home oxygen , HIV infection and generalized anxiety disorder who presented to the ED with major complaints of generalized body pain, lower back pain, and bilateral hip pain that is consistent with his typical sickle cell pain crisis.   Patient says he is feeling much better.  Unfortunately hemoglobin has dropped again below baseline today and is currently on blood transfusion.  He has no new complaint today.  He denies any fever, cough, shortness of breath, nausea, vomiting or diarrhea.  No urinary symptoms.  Objective:  Vital signs in last 24 hours:  Vitals:   03/25/24 0949 03/25/24 1013 03/25/24 1206 03/25/24 1237  BP: 121/72 110/80 95/65   Pulse: 96 90 79   Resp: 17 15 14 16   Temp: 98.2 F (36.8 C) 97.8 F (36.6 C) 98.2 F (36.8 C)   TempSrc: Oral Oral Oral   SpO2: (!) 89% 90% 93% 91%  Weight:      Height:        Intake/Output from previous day:   Intake/Output Summary (Last 24 hours) at 03/25/2024 1248 Last data filed at 03/25/2024 1206 Gross per 24 hour  Intake 1012.27 ml  Output 2375 ml  Net -1362.73 ml    Physical Exam: General: Alert, awake, oriented x3, in no acute distress.  Chronically ill looking. HEENT: [place]/AT [place]ER[place], EOMI Neck: Trachea midline,  no masses, no thyromegal,y no JVD, no carotid bruit OROPHARYNX:  Moist, No exudate/ erythema/lesions.  Heart: Regular rate and rhythm, without murmurs, rubs, gallops, PMI non-displaced, no heaves or thrills on palpation.  Lungs: Clear to auscultation, no wheezing or rhonchi noted. No increased vocal fremitus resonant to percussion  Abdomen: Soft, nontender, nondistended,  positive bowel sounds, no masses no hepatosplenomegaly noted..  Neuro: No focal neurological deficits noted cranial nerves II through XII grossly intact. DTRs 2+ bilaterally upper and lower extremities. Strength 5 out of 5 in bilateral upper and lower extremities. Musculoskeletal: No warm swelling or erythema around joints, no spinal tenderness noted. Psychiatric: Patient alert and oriented x3, good insight and cognition, good recent to remote recall. Lymph node survey: No cervical axillary or inguinal lymphadenopathy noted.  Lab Results:  Basic Metabolic Panel:    Component Value Date/Time   NA 139 03/25/2024 0444   NA 140 01/12/2023 1607   K 4.3 03/25/2024 0444   CL 105 03/25/2024 0444   CO2 26 03/25/2024 0444   BUN 19 03/25/2024 0444   BUN 8 01/12/2023 1607   CREATININE 1.17 03/25/2024 0444   GLUCOSE 109 (H) 03/25/2024 0444   CALCIUM  8.3 (L) 03/25/2024 0444   CBC:    Component Value Date/Time   WBC 7.7 03/25/2024 0444   HGB 6.1 (LL) 03/25/2024 0444   HGB 7.4 (L) 01/12/2023 1607   HCT 16.1 (L) 03/25/2024 0444   HCT 21.7 (L) 01/12/2023 1607   PLT 344 03/25/2024 0444   PLT 321 01/12/2023 1607   MCV 95.8 03/25/2024 0444   MCV 94 01/12/2023 1607   NEUTROABS 11.2 (H) 03/20/2024 0430   NEUTROABS 3.8 01/12/2023 1607   LYMPHSABS 2.8 03/20/2024 0430   LYMPHSABS 5.6 (H) 01/12/2023 1607   MONOABS 1.1 (H) 03/20/2024 0430   EOSABS 0.1 03/20/2024 0430  EOSABS 0.2 01/12/2023 1607   BASOSABS 0.0 03/20/2024 0430   BASOSABS 0.1 01/12/2023 1607    No results found for this or any previous visit (from the past 240 hours).  Studies/Results: No results found.  Medications: Scheduled Meds:  abacavir -dolutegravir -lamiVUDine   1 tablet Oral QHS   Chlorhexidine  Gluconate Cloth  6 each Topical Daily   cholecalciferol   5,000 Units Oral Q Tue   dabigatran   150 mg Oral Q12H   folic acid   500 mcg Oral Daily   HYDROmorphone    Intravenous Q4H   hydroxyurea   2,000 mg Oral QHS   miconazole     Topical BID   senna-docusate  1 tablet Oral BID   sodium chloride  flush  10-40 mL Intracatheter Q12H   Continuous Infusions: PRN Meds:.albuterol , cyclobenzaprine , diphenhydrAMINE , gabapentin , guaiFENesin -dextromethorphan , lip balm, naloxone  **AND** sodium chloride  flush, naloxone , ondansetron , oxyCODONE -acetaminophen  **AND** oxyCODONE , pantoprazole , polyethylene glycol  Consultants: None  Procedures: None  Antibiotics: None  Assessment/Plan: Principal Problem:   Sickle cell disease with crisis (HCC) Active Problems:   Generalized anxiety disorder   GERD (gastroesophageal reflux disease)   HIV (human immunodeficiency virus infection) (HCC)   Acute pulmonary embolism (HCC)   Acute on chronic respiratory failure with hypoxia (HCC)   Acute on chronic anemia   Pulmonary hypertension, primary (HCC)  Hb Sickle Cell Disease with Pain crisis: Improving.  Continue IVF at KVO, continue weight based Dilaudid  PCA at current dose setting, continue oral home pain medications as ordered. Monitor vitals very closely, Re-evaluate pain scale regularly, 2 L of Oxygen  by [place]. Patient encouraged to ambulate on the hallway today.  Anemia of Chronic Disease: Hemoglobin has dropped to 6.1, significantly below patient's baseline today.  Will transfuse with 1 unit of packed red blood cells.  Repeat labs in AM. Chronic pain Syndrome: Continue oral home pain medications as ordered. Generalized anxiety disorder: Clinically stable.  Patient denies any suicidal ideations or thoughts.  Will continue supportive care. Asymptomatic HIV infection: Patient is stable.  Continue his home regimen. GERD: Stable.  Continue PPIs. Severe Pulmonary Hypertension: Patient is hemodynamically stable. Appreciate cardiologist input.  Patient has missed multiple outpatient follow-up appointments with cardiologist for right heart catheterization. Patient encouraged to keep the rescheduled appointment as recommended by cardiologist.  Patient verbalized understanding.  Code Status: Full Code Family Communication: N/A Disposition Plan: For possible discharge home tomorrow morning, 03/26/2024.  Pamela Intrieri  If 7PM-7AM, please contact night-coverage.  03/25/2024, 12:48 PM  LOS: 6 days

## 2024-03-25 NOTE — Progress Notes (Signed)
 Patient's LR 500mL bolus to run at a rate of 500 mL/hr started at 0624 and set to [facility] rate of 20 after completion at 0724. Upon entering room at 0955 to begin blood transfusion pump rate had been increased to 500 mL and LR bag was empty with air in line up to the channel. [place], MD notified of pump manipulation. PCA and KVO fluids held for blood transfusion due to access availability and patient request.

## 2024-03-26 DIAGNOSIS — R600 Localized edema: Secondary | ICD-10-CM | POA: Diagnosis not present

## 2024-03-26 DIAGNOSIS — D5700 Hb-SS disease with crisis, unspecified: Secondary | ICD-10-CM | POA: Diagnosis not present

## 2024-03-26 LAB — CBC
HCT: 19.6 % — ABNORMAL LOW (ref 39.0–52.0)
Hemoglobin: 7.6 g/dL — ABNORMAL LOW (ref 13.0–17.0)
MCH: 36.4 pg — ABNORMAL HIGH (ref 26.0–34.0)
MCHC: 38.8 g/dL — ABNORMAL HIGH (ref 30.0–36.0)
MCV: 93.8 fL (ref 80.0–100.0)
Platelets: 362 K/uL (ref 150–400)
RBC: 2.09 MIL/uL — ABNORMAL LOW (ref 4.22–5.81)
RDW: 22.6 % — ABNORMAL HIGH (ref 11.5–15.5)
WBC: 7.5 K/uL (ref 4.0–10.5)
nRBC: 56 % — ABNORMAL HIGH (ref 0.0–0.2)

## 2024-03-26 MED ORDER — HEPARIN SOD (PORK) LOCK FLUSH 100 UNIT/ML IV SOLN
500.0000 [IU] | INTRAVENOUS | Status: DC | PRN
  Filled 2024-03-26: qty 5

## 2024-03-26 NOTE — Plan of Care (Signed)
  Problem: Education: Goal: Knowledge of General Education information will improve Description: Including pain rating scale, medication(s)/side effects and non-pharmacologic comfort measures 03/26/2024 0437 by Martina Leory CROME, RN Outcome: Progressing 03/26/2024 0437 by Martina Leory CROME, RN Outcome: Progressing   Problem: Health Behavior/Discharge Planning: Goal: Ability to manage health-related needs will improve 03/26/2024 0437 by Martina Leory CROME, RN Outcome: Progressing 03/26/2024 0437 by Martina Leory CROME, RN Outcome: Progressing   Problem: Clinical Measurements: Goal: Ability to maintain clinical measurements within normal limits will improve 03/26/2024 0437 by Martina Leory CROME, RN Outcome: Progressing 03/26/2024 0437 by Martina Leory CROME, RN Outcome: Progressing Goal: Will remain free from infection 03/26/2024 0437 by Martina Leory CROME, RN Outcome: Progressing 03/26/2024 0437 by Martina Leory CROME, RN Outcome: Progressing Goal: Diagnostic test results will improve 03/26/2024 0437 by Martina Leory CROME, RN Outcome: Progressing 03/26/2024 0437 by Martina Leory CROME, RN Outcome: Progressing Goal: Respiratory complications will improve 03/26/2024 0437 by Martina Leory CROME, RN Outcome: Progressing 03/26/2024 0437 by Martina Leory CROME, RN Outcome: Progressing Goal: Cardiovascular complication will be avoided 03/26/2024 0437 by Martina Leory CROME, RN Outcome: Progressing 03/26/2024 0437 by Martina Leory CROME, RN Outcome: Progressing   Problem: Activity: Goal: Risk for activity intolerance will decrease 03/26/2024 0437 by Martina Leory CROME, RN Outcome: Progressing 03/26/2024 0437 by Martina Leory CROME, RN Outcome: Progressing   Problem: Nutrition: Goal: Adequate nutrition will be maintained 03/26/2024 0437 by Martina Leory CROME, RN Outcome: Progressing 03/26/2024 0437 by Martina Leory CROME, RN Outcome: Progressing   Problem: Coping: Goal: Level of anxiety will decrease Outcome: Progressing    Problem: Elimination: Goal: Will not experience complications related to bowel motility Outcome: Progressing Goal: Will not experience complications related to urinary retention Outcome: Progressing   Problem: Pain Managment: Goal: General experience of comfort will improve and/or be controlled Outcome: Progressing   Problem: Safety: Goal: Ability to remain free from injury will improve Outcome: Progressing   Problem: Skin Integrity: Goal: Risk for impaired skin integrity will decrease Outcome: Progressing   Problem: Education: Goal: Knowledge of vaso-occlusive preventative measures will improve Outcome: Progressing Goal: Awareness of infection prevention will improve Outcome: Progressing Goal: Awareness of signs and symptoms of anemia will improve Outcome: Progressing Goal: Long-term complications will improve Outcome: Progressing   Problem: Self-Care: Goal: Ability to incorporate actions that prevent/reduce pain crisis will improve Outcome: Progressing   Problem: Bowel/Gastric: Goal: Gut motility will be maintained Outcome: Progressing   Problem: Tissue Perfusion: Goal: Complications related to inadequate tissue perfusion will be avoided or minimized Outcome: Progressing   Problem: Respiratory: Goal: Pulmonary complications will be avoided or minimized Outcome: Progressing Goal: Acute Chest Syndrome will be identified early to prevent complications Outcome: Progressing   Problem: Fluid Volume: Goal: Ability to maintain a balanced intake and output will improve Outcome: Progressing   Problem: Sensory: Goal: Pain level will decrease with appropriate interventions Outcome: Progressing   Problem: Health Behavior: Goal: Postive changes in compliance with treatment and prescription regimens will improve Outcome: Progressing

## 2024-03-26 NOTE — Discharge Summary (Signed)
 Physician Discharge Summary  Martin [name] FMW:[id number] DOB: 03-Apr-1991 DOA: 03/19/2024  PCP: [name], [name], [name]  Admit date: 03/19/2024  Discharge date: 03/26/2024  Discharge Diagnoses:  Principal Problem:   Sickle cell disease with crisis [facility]) Active Problems:   Generalized anxiety disorder   GERD (gastroesophageal reflux disease)   HIV (human immunodeficiency virus infection) (HCC)   Acute pulmonary embolism (HCC)   Acute on chronic respiratory failure with hypoxia (HCC)   Acute on chronic anemia   Pulmonary hypertension, primary (HCC)   Peripheral edema  Discharge Condition: Stable  Disposition:   Follow-up Information     [name], [name], [name]. Call .   Specialty: Nurse Practitioner Why: to schedule an appointment for close follow up Contact information: [address] [place] [address] [phone]         Cardiology. Go to .   Why: as scheduled               Pt is discharged home in good condition and is to follow up with [name], [name], [name] this week to have labs evaluated. Hashim Martin [name] is instructed to increase activity slowly and balance with rest for the next few days, and use prescribed medication to complete treatment of pain  Diet: Regular Wt Readings from Last 3 Encounters:  03/19/24 64.1 kg  03/17/24 63.5 kg  03/05/24 63.5 kg    History of present illness:  Martin [name] is a 33 y.o. male with medical history significant of Sickle cell disease, HIV disease, chronic respiratory failure due to chronic diastolic heart failure, anxiety disorder, anemia of chronic disease, chronic pain syndrome, vitamin D  deficiency, who was just discharged from the hospital on the 23rd about a week ago after admission with sickle cell pain crisis.  Patient has since been back to the ER twice.  He returned today with another pain consistent with his typical sickle cell crisis.  Pain mainly in the  lower back and hips.  He has taken his home regimen with minimal relief.  Patient was seen in the ER and has received up to 6 mg of IV Dilaudid  and 3 doses but still having pain that is uncontrolled.  EDP has called us  to admit the patient for pain management.  Patient has been taking his HIV medications.  He is also being worked up for acute on chronic heart failure.  He has had recent echocardiogram that is indicated with diastolic dysfunction.  He has shortness of breath at baseline and is currently on oxygen .  Patient is being admitted for acute sickle cell pain crisis.   Hospital Course:  Patient was admitted for sickle cell pain crisis and managed appropriately with IVF, IV Dilaudid  via PCA, as well as other adjunct therapies per sickle cell pain management protocols.  His hemoglobin dropped to 6.1, below baseline for which he was transfused with 1 unit of packed red blood cells with improvement in hemoglobin back to baseline. Other chronic comorbidities were clinically stable during this admission, patient was hemodynamically stable throughout. His HIV infection remains asymptomatic.  Cardiology consult for his severe pulmonary hypertension was completed during this admission, their note stated No clear role for inpatient RHC - mechanism of pulmonary hypertension is known. Appears euvolemic on exam. Pulmonary pressure during exacerbations may not be indicative of baseline - best to consider outpatient RHC with advanced HF service. Agree with current management recommendations. Will arrange for outpatient follow-up with heart failure service.  Patient encouraged  to strictly keep the rescheduled cardiology appointment.  Patient slowly improved, as at today, he is eating well without restrictions, he is ambulating well with no significant pain.  There is no further clinical indication for continued admission at this time. Patient was therefore discharged home today in a hemodynamically stable condition.   Patient will continue his home medications as well as his anticoagulation for pulmonary embolism.  [name] will follow-up with PCP within 1 week of this discharge. [name] was counseled extensively about nonpharmacologic means of pain management, patient verbalized understanding and was appreciative of  the care received during this admission.   We discussed the need for good hydration, monitoring of hydration status, avoidance of heat, cold, stress, and infection triggers. We discussed the need to be adherent with taking Hydrea  and other home medications. Patient was reminded of the need to seek medical attention immediately if any symptom of bleeding, anemia, or infection occurs.  Discharge Exam: Vitals:   03/26/24 0738 03/26/24 1108  BP:  (!) 116/90  Pulse:  91  Resp: 18 13  Temp:  98.1 F (36.7 C)  SpO2: 94% 96%   Vitals:   03/26/24 0429 03/26/24 0601 03/26/24 0738 03/26/24 1108  BP:  116/79  (!) 116/90  Pulse:  84  91  Resp: 16 18 18 13   Temp:  98.5 F (36.9 C)  98.1 F (36.7 C)  TempSrc:  Oral  Oral  SpO2: 95% 95% 94% 96%  Weight:      Height:        General appearance : Awake, alert, not in any distress. Speech Clear. Not toxic looking HEENT: Atraumatic and Normocephalic, pupils equally reactive to light and accomodation Neck: Supple, no JVD. No cervical lymphadenopathy.  Chest: Good air entry bilaterally, no added sounds  CVS: S1 S2 regular, no murmurs.  Abdomen: Bowel sounds present, Non tender and not distended with no gaurding, rigidity or rebound. Extremities: B/L Lower Ext shows no edema, both legs are warm to touch Neurology: Awake alert, and oriented X 3, CN II-XII intact, Non focal Skin: No Rash  Discharge Instructions  Discharge Instructions     Diet - low sodium heart healthy   Complete by: As directed    Increase activity slowly   Complete by: As directed       Allergies as of 03/26/2024       Reactions   Ketorolac  Tromethamine  Swelling,  Other (See Comments)   Patient reports facial edema and left arm edema after administration.   Tape Rash, Other (See Comments)   PLEASE DO NOT USE THE CLEAR, THICK, PLASTIC TAPE- only paper tape is tolerated    Wound Dressing Adhesive Rash   Bactoshield Chg [chlorhexidine  Gluconate] Other (See Comments)   Per patient it makes me breakout        Medication List     TAKE these medications    albuterol  108 (90 Base) MCG/ACT inhaler Commonly known as: VENTOLIN  HFA Inhale 2 puffs into the lungs every 6 (six) hours as needed for wheezing or shortness of breath.   amoxicillin -clavulanate 875-125 MG tablet Commonly known as: AUGMENTIN  Take 1 tablet by mouth 2 (two) times daily.   cyclobenzaprine  5 MG tablet Commonly known as: FLEXERIL  Take 1 tablet (5 mg total) by mouth 2 (two) times daily as needed for muscle spasms.   dabigatran  150 MG Caps capsule Commonly known as: Pradaxa  Take 1 capsule (150 mg total) by mouth 2 (two) times daily.   dabigatran  150 MG Caps capsule Commonly  known as: PRADAXA  Take 1 capsule (150 mg total) by mouth every 12 (twelve) hours.   Deferasirox  360 MG Tabs Commonly known as: Jadenu  Take 2 tablets (720 mg total) by mouth daily.   diphenhydrAMINE  25 MG tablet Commonly known as: BENADRYL  Take 1 tablet (25 mg total) by mouth every 4 (four) hours as needed for itching. What changed: when to take this   folic acid  400 MCG tablet Commonly known as: FOLVITE  Take 1 tablet (400 mcg total) by mouth daily.   furosemide  20 MG tablet Commonly known as: LASIX  Take 0.5-1 tablets (10-20 mg total) by mouth daily for 7 days.   gabapentin  300 MG capsule Commonly known as: NEURONTIN  Take 1 capsule (300 mg total) by mouth at bedtime as needed (nerve pain).   guaiFENesin  600 MG 12 hr tablet Commonly known as: Mucinex  Take 1 tablet (600 mg total) by mouth 2 (two) times daily. What changed:  when to take this reasons to take this    guaiFENesin -dextromethorphan  100-10 MG/5ML syrup Commonly known as: ROBITUSSIN DM Take 10 mLs by mouth every 4 (four) hours as needed for cough.   hydroxyurea  500 MG capsule Commonly known as: HYDREA  Take 4 capsules (2,000 mg total) by mouth at bedtime.   Narcan  4 MG/0.1ML Liqd nasal spray kit Generic drug: naloxone  Place 1 spray into the nose as needed (respiratory distress / opioid overdose).   oxyCODONE -acetaminophen  10-325 MG tablet Commonly known as: PERCOCET Take 1 tablet by mouth every 4 (four) hours as needed for pain. What changed: when to take this   pantoprazole  40 MG tablet Commonly known as: PROTONIX  Take 1 tablet (40 mg total) by mouth daily as needed (acid reflux).   senna-docusate 8.6-50 MG tablet Commonly known as: Senokot-S Take 2 tablets by mouth at bedtime.   Triumeq  600-50-300 MG tablet Generic drug: abacavir -dolutegravir -lamiVUDine  Take 1 tablet by mouth daily. What changed: when to take this   Vitamin D3 125 MCG (5000 UT) Caps Take 5,000 Units by mouth every Tuesday.        The results of significant diagnostics from this hospitalization (including imaging, microbiology, ancillary and laboratory) are listed below for reference.    Significant Diagnostic Studies: DG Shoulder 1V Right Result Date: 03/08/2024 EXAM: 1 VIEW(S) XRAY OF THE RIGHT SHOULDER 03/08/2024 11:10:11 AM COMPARISON: None available. CLINICAL HISTORY: Pain FINDINGS: BONES AND JOINTS: Glenohumeral joint is normally aligned. No acute fracture or dislocation. [facility] joint is unremarkable in appearance. SOFT TISSUES: Right chest port noted. No abnormal calcifications. Progressive interstitial and airspace opacities within both lungs which may reflect acute chest syndrome . IMPRESSION: 1. No acute fracture or dislocation. 2. Progressive diffuse interstitial and airspace opacities within both lungs, concerning for acute chest syndrome, multifocal infection and/or pulmonary edema. .  Electronically signed by: Waddell Calk MD 03/08/2024 02:12 PM EST RP Workstation: [id number]   DG Chest Port 1 View Result Date: 03/05/2024 CLINICAL DATA:  Acute hypoxic respiratory failure. EXAM: PORTABLE CHEST 1 VIEW COMPARISON:  Radiographs 02/29/2024 and 02/09/2024.  CT 02/08/2024. FINDINGS: 1000 hours. Accessed right IJ Port-A-Cath is unchanged, extending to the lower SVC level. The heart size and mediastinal contours are stable with mild cardiomegaly and central enlargement of the pulmonary arteries. There has been further improvement in the pulmonary edema. New complete airspace disease, pneumothorax or significant pleural effusion. The bones appear unremarkable. IMPRESSION: Further improvement in pulmonary edema. No new findings. Electronically Signed   By: Elsie Perone M.D.   On: 03/05/2024 14:18   DG Chest Portable  1 View Result Date: 02/29/2024 CLINICAL DATA:  Sickle cell crisis.  Hypoxia. EXAM: PORTABLE CHEST 1 VIEW COMPARISON:  02/09/2024 FINDINGS: The cardio pericardial silhouette is enlarged. Subtle parahilar opacities bilaterally evident, decreased in the interval. Interstitial markings are diffusely coarsened with chronic features. Right Port-A-Cath again noted. Telemetry leads overlie the chest. IMPRESSION: Interval decrease in parahilar opacities suggesting improving edema. Electronically Signed   By: Camellia Candle M.D.   On: 02/29/2024 06:14    Microbiology: No results found for this or any previous visit (from the past 240 hours).   Labs: Basic Metabolic Panel: Recent Labs  Lab 03/20/24 0430 03/25/24 0444  NA 141 139  K 3.9 4.3  CL 107 105  CO2 26 26  GLUCOSE 93 109*  BUN 9 19  CREATININE 0.87 1.17  CALCIUM  8.0* 8.3*   Liver Function Tests: Recent Labs  Lab 03/20/24 0430 03/25/24 0444  AST 36 45*  ALT 37 35  ALKPHOS 73 74  BILITOT 4.3* 6.2*  PROT 7.0 7.5  ALBUMIN 3.5 3.6   No results for input(s): LIPASE, AMYLASE in the last 168 hours. No results  for input(s): AMMONIA in the last 168 hours. CBC: Recent Labs  Lab 03/20/24 0430 03/25/24 0444 03/26/24 0438  WBC 15.3* 7.7 7.5  NEUTROABS 11.2*  --   --   HGB 7.4* 6.1* 7.6*  HCT 21.4* 16.1* 19.6*  MCV 96.8 95.8 93.8  PLT 289 344 362   Cardiac Enzymes: No results for input(s): CKTOTAL, CKMB, CKMBINDEX, TROPONINI in the last 168 hours. BNP: Invalid input(s): POCBNP CBG: No results for input(s): GLUCAP in the last 168 hours.  Time coordinating discharge: 50 minutes  Signed:  Broden Holt  [facility] 03/26/2024, 11:52 AM

## 2024-03-27 ENCOUNTER — Telehealth: Payer: Self-pay

## 2024-03-27 LAB — BPAM RBC
Blood Product Expiration Date: 202512302359
ISSUE DATE / TIME: 202512060944
Unit Type and Rh: 5100

## 2024-03-27 LAB — TYPE AND SCREEN
ABO/RH(D): O POS
Antibody Screen: NEGATIVE
Donor AG Type: NEGATIVE
Unit division: 0

## 2024-03-27 NOTE — Transitions of Care (Post Inpatient/ED Visit) (Signed)
   03/27/2024  Name: Martin [name] MRN: [id number] DOB: [birth year]  Today's TOC FU Call Status: Today's TOC FU Call Status:: Unsuccessful Call (1st Attempt) Unsuccessful Call (1st Attempt) Date: 03/27/24  Attempted to reach the patient regarding the most recent Inpatient/ED visit.  Follow Up Plan: Additional outreach attempts will be made to reach the patient to complete the Transitions of Care (Post Inpatient/ED visit) call.   Arvin Seip RN, BSN, CCM [employer], Population Health Case Manager Phone: [phone]

## 2024-03-28 ENCOUNTER — Telehealth: Payer: Self-pay | Admitting: *Deleted

## 2024-03-28 NOTE — Transitions of Care (Post Inpatient/ED Visit) (Signed)
   03/28/2024  Name: Martin [name] MRN: [id number] DOB: 12-14-1990  Today's TOC FU Call Status: Today's TOC FU Call Status:: Unsuccessful Call (2nd Attempt) Unsuccessful Call (2nd Attempt) Date: 03/28/24  Attempted to reach the patient regarding the most recent Inpatient/ED visit.  Follow Up Plan: Additional outreach attempts will be made to reach the patient to complete the Transitions of Care (Post Inpatient/ED visit) call.   [name] [facility], BSN RN Care Manager/ Transition of Care [place]/ [facility] [phone]

## 2024-03-29 ENCOUNTER — Telehealth: Payer: Self-pay | Admitting: *Deleted

## 2024-03-29 ENCOUNTER — Other Ambulatory Visit (HOSPITAL_COMMUNITY): Payer: Self-pay

## 2024-03-29 ENCOUNTER — Other Ambulatory Visit: Payer: Self-pay | Admitting: Nurse Practitioner

## 2024-03-29 ENCOUNTER — Other Ambulatory Visit: Payer: Self-pay

## 2024-03-29 DIAGNOSIS — D571 Sickle-cell disease without crisis: Secondary | ICD-10-CM

## 2024-03-29 DIAGNOSIS — I2609 Other pulmonary embolism with acute cor pulmonale: Secondary | ICD-10-CM

## 2024-03-29 DIAGNOSIS — F1190 Opioid use, unspecified, uncomplicated: Secondary | ICD-10-CM

## 2024-03-29 NOTE — Telephone Encounter (Unsigned)
 Copied from CRM #[id number]. Topic: Clinical - Medication Refill >> Mar 29, 2024 10:05 AM Nessti S wrote: Medication: oxyCODONE -acetaminophen  (PERCOCET) 10-325 MG tablet   Has the patient contacted their pharmacy? Yes (Agent: If no, request that the patient contact the pharmacy for the refill. If patient does not wish to contact the pharmacy document the reason why and proceed with request.) (Agent: If yes, when and what did the pharmacy advise?)  This is the patient's preferred pharmacy: [facility] [address] [place] [address] Phone: [phone] Fax: [phone]  Is this the correct pharmacy for this prescription? Yes If no, delete pharmacy and type the correct one.   Has the prescription been filled recently? No  Is the patient out of the medication? Yes  Has the patient been seen for an appointment in the last year OR does the patient have an upcoming appointment? Yes  Can we respond through MyChart? Yes  Agent: Please be advised that Rx refills may take up to 3 business days. We ask that you follow-up with your pharmacy.

## 2024-03-29 NOTE — Transitions of Care (Post Inpatient/ED Visit) (Signed)
° °  03/29/2024  Name: Martin [name] MRN: [id number] DOB: 1990/05/28  Today's TOC FU Call Status: Today's TOC FU Call Status:: Unsuccessful Call (3rd Attempt) Unsuccessful Call (3rd Attempt) Date: 03/29/24  Attempted to reach the patient regarding the most recent Inpatient/ED visit.  Follow Up Plan: No further outreach attempts will be made at this time. We have been unable to contact the patient.  [name] [facility], BSN RN Care Manager/ Transition of Care [place]/ [facility] [phone]

## 2024-03-29 NOTE — Progress Notes (Signed)
 03/29/2024 Name: Martin [name] MRN: [id number] DOB: 06/29/1990  Chief Complaint  Patient presents with   Medication Problem    Pradaxa     Martin [name] is a 33 y.o. year old male who presented for a telephone visit.   They were referred to the pharmacist by their PCP for assistance in managing medication access. PMH includes hx of PE, sickle cell disease, anxiety, HIV.   Subjective: Patient was last seen by PCP, [name], NP, on 02/04/24. 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 Per history - patient was hospitalized for recurrent PE in Sept 2025 in the setting of inconsistent adherence to Eliquis  (patient reported missing ~25% of doses). He was switched to Xarelto  at discharge for once daily dosing, but was unable to obtain due to lack of insurance (had [place] Medicaid). He had recurrent hospitalizations for sickle cell and worsening ShOB with PE and was eventually switched to Pradaxa  for cost and question of therapeutic failure on Eliquis .   Today, patient reports doing ok. Pradaxa  has not been dispensed since it was filled at discharge from [facility] on 02/17/24. Patient reports he is taking twice daily as prescribed. States he still has a couple weeks left since he was in the hospital so often since he was given the medication. Discussed that lowest cash price for refill for 30ds of Pradaxa  is $56.55. Patient states this is not affordable since he also has to be $40 per mo for his pain medication. He states that he has 5-6 bottles of Eliquis  remaining (currently in his possession) from before he was switched to a new medication. States he would prefer to restart this and feels he could adhere to twice daily dosing.   Care Team: Primary Care Provider: [name], [name], [name] ; Next Scheduled Visit: 05/08/24  Medication Access/Adherence  Current Pharmacy:  DARRYLE LONG - [facility] [address] [place] [address] Phone: [phone] Fax: [phone]   Patient reports affordability concerns with their medications: Yes  - applied for [place] Medicaid about 30 days ago, but has not heard back about application Patient reports access/transportation concerns to their pharmacy: No  - states he could get to [facility] office. Patient reports adherence concerns with their medications:  Yes  - hx of nonadherence to twice daily medications like Eliquis . Denies missed doses of BID Pradaxa .   Medication Management:  Recent fill dates:   - Eliquis  5 mg BID - 08/13/23 for 30ds from [facility] pharmacy (patient insists he has 5-6 bottles remaining at home, it is likely that our system does not pull dispenses from medications filed to [place]  Medicaid, but I am not sure) - Pradaxa  (dabigatran ) 150 mg BID - 02/17/24 for 30ds from [facility] pharmacy - Xarelto  - never filled   Objective:  BP Readings from Last 3 Encounters:  03/26/24 (!) 116/90  03/17/24 109/72  03/12/24 112/66    Lab Results  Component Value Date   HGBA1C (L) 05/22/2009    <4.0 (NOTE) The ADA recommends the following therapeutic goal for glycemic control related to Hgb A1c measurement: Goal of therapy: <6.5 Hgb A1c  Reference: American Diabetes Association: Clinical Practice Recommendations 2010, Diabetes Care, 2010, 33: (Suppl  1).       Latest Ref Rng & Units 03/25/2024    4:44 AM 03/20/2024    4:30 AM 03/19/2024    5:35 AM  BMP  Glucose 70 - 99 mg/dL 890  93  108   BUN 6 - 20 mg/dL 19  9  12    Creatinine 0.61 - 1.24 mg/dL 8.82  9.12  9.13   Sodium 135 - 145 mmol/L 139  141  141   Potassium 3.5 - 5.1 mmol/L 4.3  3.9  3.2   Chloride 98 - 111 mmol/L 105  107  108   CO2 22 - 32 mmol/L 26  26  23    Calcium  8.9 - 10.3 mg/dL 8.3  8.0  7.8     No results found for: CHOL, HDL, LDLCALC, LDLDIRECT, [name], CHOLHDL  Medications Reviewed Today     Reviewed by Brinda Lorain SQUIBB, RPH-CPP (Pharmacist) on 03/29/24 at 1655   Med List Status: <None>   Medication Order Taking? Sig Documenting Provider Last Dose Status Informant  abacavir -dolutegravir -lamiVUDine  (TRIUMEQ ) 600-50-300 MG tablet [id number]  Take 1 tablet by mouth daily.  Patient taking differently: Take 1 tablet by mouth at bedtime.   Pearlean Manus, MD  Active Self           Med Note (CRUTHIS, CHLOE C   Sat Mar 04, 2024 11:41 AM) No fill hx found for this medication. Pt is adamant he is taking it.   albuterol  (VENTOLIN  HFA) 108 (90 Base) MCG/ACT inhaler [id number]  Inhale 2 puffs into the lungs every 6 (six) hours as needed for wheezing or shortness of breath. Pearlean Manus, MD  Active Self           Med Note (CRUTHIS, CHLOE C   Tue Feb 29, 2024  7:23 AM)    amoxicillin -clavulanate (AUGMENTIN ) 875-125 MG tablet [id number]  Take 1 tablet by mouth 2 (two) times daily. Jegede, Olugbemiga E, MD  Active Self  Cholecalciferol  (VITAMIN D3) 125 MCG (5000 UT) CAPS [id number]  Take 5,000 Units by mouth every Tuesday. [provider]  Active Self           Med Note (CRUTHIS, CHLOE C   Tue Feb 29, 2024  7:23 AM)    cyclobenzaprine  (FLEXERIL ) 5 MG tablet [id number]  Take 1 tablet (5 mg total) by mouth 2 (two) times daily as needed for muscle spasms. [name], [name], [name]  Active Self           Med Note (CRUTHIS, CHLOE C   Sat Mar 04, 2024 11:41 AM) No fill hx found for this medication. Pt is adamant he is taking it.   dabigatran  (PRADAXA ) 150 MG CAPS capsule [id number] Yes Take 1 capsule (150 mg total) by mouth 2 (two) times daily. Jegede, Olugbemiga E, MD  Active Self  dabigatran  (PRADAXA ) 150 MG CAPS capsule [id number]  Take 1 capsule (150 mg total) by mouth every 12 (twelve) hours.  Patient not taking: No sig reported   Cherylene [name] HERO, NP  Active Self           Med Note [name] Mar 20, 2024  9:35 AM)    Deferasirox  (JADENU ) 360 MG TABS [id number]  Take 2 tablets (720 mg total) by mouth daily. [name], [name], [name]  Active Self            Med Note [name] Mar 20, 2024  9:35 AM) No dispenses.   diphenhydrAMINE  (BENADRYL ) 25 MG tablet [id number]  Take 1 tablet (25 mg total) by mouth every 4 (four) hours as needed for itching.  Patient taking differently: Take 25 mg by mouth daily as needed for itching.   Pearlean Manus, MD  Active  Self  folic acid  (FOLVITE ) 400 MCG tablet [id number]  Take 1 tablet (400 mcg total) by mouth daily. Pearlean Manus, MD  Active Self           Med Note (CRUTHIS, CHLOE C   Tue Feb 29, 2024  7:23 AM)    gabapentin  (NEURONTIN ) 300 MG capsule [id number]  Take 1 capsule (300 mg total) by mouth at bedtime as needed (nerve pain). Pearlean Manus, MD  Active Self           Med Note (CRUTHIS, CHLOE C   Sat Mar 04, 2024 11:43 AM) No fill hx found for this medication. Pt is adamant he is taking it.   guaiFENesin  (MUCINEX ) 600 MG 12 hr tablet [id number]  Take 1 tablet (600 mg total) by mouth 2 (two) times daily.  Patient taking differently: Take 600 mg by mouth 2 (two) times daily as needed for cough or to loosen phlegm.   Pearlean Manus, MD  Active Self  guaiFENesin -dextromethorphan  (ROBITUSSIN DM) 100-10 MG/5ML syrup [id number]  Take 10 mLs by mouth every 4 (four) hours as needed for cough. Jegede, Olugbemiga E, MD  Active Self  hydroxyurea  (HYDREA ) 500 MG capsule [id number]  Take 4 capsules (2,000 mg total) by mouth at bedtime. Pearlean Manus, MD  Active Self           Med Note (CRUTHIS, CHLOE C   Sat Mar 04, 2024 11:44 AM) No fill hx found for this medication. Pt is adamant he is taking it.   NARCAN  4 MG/0.1ML LIQD nasal spray kit [id number]  Place 1 spray into the nose as needed (respiratory distress / opioid overdose). [provider]  Active Self           Med Note MARISA, NATHANEL SAILOR   Tue Aug 10, 2023  2:21 PM)    oxyCODONE -acetaminophen  (PERCOCET) 10-325 MG tablet [id number]  Take 1 tablet by mouth every 4 (four) hours as needed for pain.  Patient taking differently: Take 1  tablet by mouth 5 (five) times daily.   [name], [name], [name]  Active Self  pantoprazole  (PROTONIX ) 40 MG tablet [id number]  Take 1 tablet (40 mg total) by mouth daily as needed (acid reflux). Pearlean Manus, MD  Active Self           Med Note (CRUTHIS, CHLOE C   Sat Mar 04, 2024 11:44 AM) No fill hx found for this medication. Pt is adamant he is taking it.   senna-docusate (SENOKOT-S) 8.6-50 MG tablet [id number]  Take 2 tablets by mouth at bedtime.  Patient not taking: Reported on 03/04/2024   Pearlean Manus, MD  Active Self              Assessment/Plan:   Medication Management: - Currently strategy insufficient to maintain appropriate adherence to prescribed medication regimen due to unaffordable cost of Pradaxa  (dabigatran ) moving forward. Lowest price for 30ds is $56.66 at [facility] using a GoodRx coupon, which patient states is not feasible at this time. Given question of therapeutic failure to Eliquis  vs development of recurrent PE due to inconsistent adherence to Eliquis , outreached PCP [name], NP and Dr. Jegede to determine whether it would be appropriate for patient to resume Eliquis  5 mg BID when he finishes current supply of Pradaxa . Awaiting response.  - To prevent delays in care, will work with patient advocate team to initiate application for Eliquis  patient assistance, as he will likely qualify while he does not have insurance ([place] Medicaid [place], [place] Medicaid  pending).  - Advised to visit [facility] Medicaid office to follow-up on Medicaid application. If approved for Medicaid, would pursue once daily Xarelto  for improved adherence. Would need to advise patient to take with a large meal to improved absorption and prevent recurrent event.  Written patient instructions provided. Patient verbalized understanding of treatment plan.   Follow Up Plan:  Pharmacist telephone 04/17/24 PCP clinic visit in Jan 2026   Lorain Baseman, PharmD [facility] [phone]

## 2024-03-30 ENCOUNTER — Other Ambulatory Visit (HOSPITAL_COMMUNITY): Payer: Self-pay

## 2024-03-30 ENCOUNTER — Other Ambulatory Visit: Payer: Self-pay

## 2024-03-30 DIAGNOSIS — D571 Sickle-cell disease without crisis: Secondary | ICD-10-CM

## 2024-03-30 DIAGNOSIS — F1190 Opioid use, unspecified, uncomplicated: Secondary | ICD-10-CM

## 2024-03-30 NOTE — Progress Notes (Signed)
 PCP advised it is ok for patient to switch to Eliquis  5 mg BID when he finishes current supply of Pradaxa . Patient aware of plan. Will follow-up as scheduled on 04/17/24.   Lorain Baseman, PharmD [facility] [phone]

## 2024-03-30 NOTE — Telephone Encounter (Signed)
 Copied from CRM #[id number]. Topic: Clinical - Medication Question >> Mar 30, 2024  1:18 PM Antony RAMAN wrote: Reason for CRM: patient wanted to let the doctor know he's out of his medication, I advised 3 business days  Please advise [facility]

## 2024-03-31 ENCOUNTER — Other Ambulatory Visit: Payer: Self-pay | Admitting: Nurse Practitioner

## 2024-03-31 ENCOUNTER — Emergency Department (HOSPITAL_COMMUNITY)
Admission: EM | Admit: 2024-03-31 | Discharge: 2024-03-31 | Disposition: A | Discharge status: Home / Self Care | Attending: Emergency Medicine | Admitting: Emergency Medicine

## 2024-03-31 ENCOUNTER — Other Ambulatory Visit: Payer: Self-pay

## 2024-03-31 ENCOUNTER — Other Ambulatory Visit (HOSPITAL_COMMUNITY): Payer: Self-pay

## 2024-03-31 ENCOUNTER — Encounter (HOSPITAL_COMMUNITY): Payer: Self-pay | Admitting: Emergency Medicine

## 2024-03-31 DIAGNOSIS — Z7901 Long term (current) use of anticoagulants: Secondary | ICD-10-CM | POA: Insufficient documentation

## 2024-03-31 DIAGNOSIS — D571 Sickle-cell disease without crisis: Secondary | ICD-10-CM

## 2024-03-31 DIAGNOSIS — Z21 Asymptomatic human immunodeficiency virus [HIV] infection status: Secondary | ICD-10-CM | POA: Insufficient documentation

## 2024-03-31 DIAGNOSIS — Z86711 Personal history of pulmonary embolism: Secondary | ICD-10-CM | POA: Insufficient documentation

## 2024-03-31 DIAGNOSIS — D5700 Hb-SS disease with crisis, unspecified: Secondary | ICD-10-CM | POA: Insufficient documentation

## 2024-03-31 DIAGNOSIS — F1190 Opioid use, unspecified, uncomplicated: Secondary | ICD-10-CM

## 2024-03-31 LAB — COMPREHENSIVE METABOLIC PANEL WITH GFR
ALT: 14 U/L (ref 0–44)
AST: 33 U/L (ref 15–41)
Albumin: 3.9 g/dL (ref 3.5–5.0)
Alkaline Phosphatase: 87 U/L (ref 38–126)
Anion gap: 9 (ref 5–15)
BUN: 17 mg/dL (ref 6–20)
CO2: 24 mmol/L (ref 22–32)
Calcium: 8.8 mg/dL — ABNORMAL LOW (ref 8.9–10.3)
Chloride: 109 mmol/L (ref 98–111)
Creatinine, Ser: 0.77 mg/dL (ref 0.61–1.24)
GFR, Estimated: >60 mL/min (ref >60)
Glucose, Bld: 74 mg/dL (ref 70–99)
Potassium: 4 mmol/L (ref 3.5–5.1)
Sodium: 142 mmol/L (ref 135–145)
Total Bilirubin: 3.6 mg/dL — ABNORMAL HIGH (ref 0.0–1.2)
Total Protein: 7.9 g/dL (ref 6.5–8.1)

## 2024-03-31 LAB — RETICULOCYTES
Immature Retic Fract: 22.9 % — ABNORMAL HIGH (ref 2.3–15.9)
RBC.: 2.61 MIL/uL — ABNORMAL LOW (ref 4.22–5.81)
Retic Count, Absolute: 415.2 K/uL — ABNORMAL HIGH (ref 19.0–186.0)
Retic Ct Pct: 15.9 % — ABNORMAL HIGH (ref 0.4–3.1)

## 2024-03-31 LAB — CBC WITH DIFFERENTIAL/PLATELET
Basophils Absolute: 0 K/uL (ref 0.0–0.1)
Basophils Relative: 0 %
Eosinophils Absolute: 0.1 K/uL (ref 0.0–0.5)
Eosinophils Relative: 1 %
HCT: 25.6 % — ABNORMAL LOW (ref 39.0–52.0)
Hemoglobin: 9.1 g/dL — ABNORMAL LOW (ref 13.0–17.0)
Lymphocytes Relative: 29 %
Lymphs Abs: 2.8 K/uL (ref 0.7–4.0)
MCH: 36.4 pg — ABNORMAL HIGH (ref 26.0–34.0)
MCHC: 35.5 g/dL (ref 30.0–36.0)
MCV: 102.4 fL — ABNORMAL HIGH (ref 80.0–100.0)
Monocytes Absolute: 0.8 K/uL (ref 0.1–1.0)
Monocytes Relative: 8 %
Neutro Abs: 6.1 K/uL (ref 1.7–7.7)
Neutrophils Relative %: 62 %
Platelets: 262 K/uL (ref 150–400)
RBC: 2.5 MIL/uL — ABNORMAL LOW (ref 4.22–5.81)
RDW: 30.5 % — ABNORMAL HIGH (ref 11.5–15.5)
WBC: 9.8 K/uL (ref 4.0–10.5)
nRBC: 36.5 % — ABNORMAL HIGH (ref 0.0–0.2)

## 2024-03-31 MED ORDER — SODIUM CHLORIDE 0.45 % IV SOLN: INTRAVENOUS | Status: DC

## 2024-03-31 MED ORDER — OXYCODONE-ACETAMINOPHEN 10-325 MG PO TABS
1.0000 | ORAL_TABLET | ORAL | 0 refills | Status: DC | PRN
  Filled 2024-03-31: qty 90, 15d supply, fill #0

## 2024-03-31 MED ORDER — ONDANSETRON HCL 4 MG/2ML IJ SOLN
4.0000 mg | INTRAMUSCULAR | Status: DC | PRN
  Filled 2024-03-31: qty 2

## 2024-03-31 MED ORDER — HYDROMORPHONE HCL 1 MG/ML IJ SOLN
2.0000 mg | INTRAMUSCULAR | Status: AC
  Administered 2024-03-31: 2 mg via INTRAVENOUS
  Filled 2024-03-31: qty 2

## 2024-03-31 MED ORDER — HEPARIN SOD (PORK) LOCK FLUSH 100 UNIT/ML IV SOLN
500.0000 [IU] | Freq: Once | INTRAVENOUS | Status: AC
  Administered 2024-03-31: 500 [IU]
  Filled 2024-03-31: qty 5

## 2024-03-31 MED ORDER — DIPHENHYDRAMINE HCL 25 MG PO CAPS
25.0000 mg | ORAL_CAPSULE | ORAL | Status: DC | PRN
  Filled 2024-03-31: qty 1

## 2024-03-31 NOTE — ED Triage Notes (Signed)
 Patient c/o sickle cell pain x 1 day. Patient report taking PRN pain medication with no relief. Patient denies Chest pain and SOB. Patient denies fever. Patient c/o  10/10 generalized pain.

## 2024-03-31 NOTE — ED Notes (Signed)
 Patient wants his blood collected to his [place].

## 2024-03-31 NOTE — Progress Notes (Signed)
 Reviewed PDMP substance reporting system prior to prescribing opiate medications. No inconsistencies noted.    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  Dispense: 90 tablet; Refill: 0  2. Chronic, continuous use of opioids  - oxyCODONE -acetaminophen  (PERCOCET) 10-325 MG tablet; Take 1 tablet by mouth every 4 (four) hours as needed for pain.  Dispense: 90 tablet; Refill: 0

## 2024-03-31 NOTE — ED Provider Notes (Signed)
 [place] EMERGENCY DEPARTMENT AT [facility] Provider Note   CSN: [id number] Arrival date & time: 03/31/24  9557     Patient presents with: Sickle Cell Pain Crisis   Martin [name] is a 33 y.o. male.   HPI     33yo male with history of HIV, sickle cell disease, PE on anticoagulation who presents with concern for sickle cell pain.  Sickle cell pain began yesterday All over No chest pain or dyspnea, no fevers Taking home meds wihtout help Slight cough but not significant or worse    Past Medical History:  Diagnosis Date   Anxiety    Chest pain 02/04/2024   HIV (human immunodeficiency virus infection) (HCC)    Proteinuria    Sickle cell disease (HCC)    Vitamin D  deficiency 10/2018    Prior to Admission medications  Medication Sig Start Date End Date Taking? Authorizing Provider  abacavir -dolutegravir -lamiVUDine  (TRIUMEQ ) 600-50-300 MG tablet Take 1 tablet by mouth daily. Patient taking differently: Take 1 tablet by mouth at bedtime. 08/13/23   Pearlean Manus, MD  albuterol  (VENTOLIN  HFA) 108 (90 Base) MCG/ACT inhaler Inhale 2 puffs into the lungs every 6 (six) hours as needed for wheezing or shortness of breath. 08/13/23   Pearlean Manus, MD  amoxicillin -clavulanate (AUGMENTIN ) 875-125 MG tablet Take 1 tablet by mouth 2 (two) times daily. 03/12/24   Jegede, Olugbemiga E, MD  Cholecalciferol  (VITAMIN D3) 125 MCG (5000 UT) CAPS Take 5,000 Units by mouth every Tuesday.    [provider]  cyclobenzaprine  (FLEXERIL ) 5 MG tablet Take 1 tablet (5 mg total) by mouth 2 (two) times daily as needed for muscle spasms. 02/04/24   [name], [name], [name]  dabigatran  (PRADAXA ) 150 MG CAPS capsule Take 1 capsule (150 mg total) by mouth 2 (two) times daily. 02/09/24   Jegede, Olugbemiga E, MD  dabigatran  (PRADAXA ) 150 MG CAPS capsule Take 1 capsule (150 mg total) by mouth every 12 (twelve) hours. Patient not taking: No sig reported 02/16/24   Cherylene [name] HERO, NP  Deferasirox  (JADENU ) 360 MG TABS Take 2 tablets (720 mg total) by mouth daily. 11/05/23   [name], [name], [name]  diphenhydrAMINE  (BENADRYL ) 25 MG tablet Take 1 tablet (25 mg total) by mouth every 4 (four) hours as needed for itching. Patient taking differently: Take 25 mg by mouth daily as needed for itching. 08/13/23   Pearlean Manus, MD  folic acid  (FOLVITE ) 400 MCG tablet Take 1 tablet (400 mcg total) by mouth daily. 08/13/23   Pearlean Manus, MD  gabapentin  (NEURONTIN ) 300 MG capsule Take 1 capsule (300 mg total) by mouth at bedtime as needed (nerve pain). 08/13/23   Pearlean Manus, MD  guaiFENesin  (MUCINEX ) 600 MG 12 hr tablet Take 1 tablet (600 mg total) by mouth 2 (two) times daily. Patient taking differently: Take 600 mg by mouth 2 (two) times daily as needed for cough or to loosen phlegm. 08/13/23 08/12/24  Pearlean Manus, MD  guaiFENesin -dextromethorphan  (ROBITUSSIN DM) 100-10 MG/5ML syrup Take 10 mLs by mouth every 4 (four) hours as needed for cough. 03/12/24   Jegede, Olugbemiga E, MD  hydroxyurea  (HYDREA ) 500 MG capsule Take 4 capsules (2,000 mg total) by mouth at bedtime. 08/13/23   Pearlean Manus, MD  NARCAN  4 MG/0.1ML LIQD nasal spray kit Place 1 spray into the nose as needed (respiratory distress / opioid overdose). 01/07/23   [provider]  oxyCODONE -acetaminophen  (PERCOCET) 10-325 MG tablet Take 1 tablet by mouth every 4 (four) hours as  needed for pain. 03/31/24   [name], [name], [name]  pantoprazole  (PROTONIX ) 40 MG tablet Take 1 tablet (40 mg total) by mouth daily as needed (acid reflux). 08/13/23   Pearlean Manus, MD  senna-docusate (SENOKOT-S) 8.6-50 MG tablet Take 2 tablets by mouth at bedtime. Patient not taking: Reported on 03/04/2024 08/13/23   Pearlean Manus, MD    Allergies: Ketorolac  tromethamine , Tape, Wound dressing adhesive, and Bactoshield chg [chlorhexidine  gluconate]    Review of Systems  Updated Vital Signs BP 132/84 (BP  Location: Left Arm)   Pulse 85   Temp 98 F (36.7 C) (Oral)   Resp 16   Ht 6' 3 (1.905 m)   Wt 64 kg   SpO2 92%   BMI 17.62 kg/m   Physical Exam Vitals and nursing note reviewed.  Constitutional:      General: He is not in acute distress.    Appearance: He is well-developed. He is not diaphoretic.  HENT:     Head: Normocephalic and atraumatic.  Eyes:     Conjunctiva/sclera: Conjunctivae normal.  Cardiovascular:     Rate and Rhythm: Normal rate and regular rhythm.     Heart sounds: Normal heart sounds. No murmur heard.    No friction rub. No gallop.  Pulmonary:     Effort: Pulmonary effort is normal. No respiratory distress.     Breath sounds: Normal breath sounds. No wheezing or rales.  Abdominal:     General: There is no distension.     Palpations: Abdomen is soft.     Tenderness: There is no abdominal tenderness. There is no guarding.  Musculoskeletal:     Cervical back: Normal range of motion.  Skin:    General: Skin is warm and dry.  Neurological:     Mental Status: He is alert and oriented to person, place, and time.     (all labs ordered are listed, but only abnormal results are displayed) Labs Reviewed  COMPREHENSIVE METABOLIC PANEL WITH GFR - Abnormal; Notable for the following components:      Result Value   Calcium  8.8 (*)    Total Bilirubin 3.6 (*)    All other components within normal limits  CBC WITH DIFFERENTIAL/PLATELET - Abnormal; Notable for the following components:   RBC 2.50 (*)    Hemoglobin 9.1 (*)    HCT 25.6 (*)    MCV 102.4 (*)    MCH 36.4 (*)    RDW 30.5 (*)    nRBC 36.5 (*)    All other components within normal limits  RETICULOCYTES - Abnormal; Notable for the following components:   Retic Ct Pct 15.9 (*)    RBC. 2.61 (*)    Retic Count, Absolute 415.2 (*)    Immature Retic Fract 22.9 (*)    All other components within normal limits    EKG: EKG Interpretation Date/Time:  Friday March 31 2024 04:53:53 EST Ventricular  Rate:  87 PR Interval:  179 QRS Duration:  87 QT Interval:  406 QTC Calculation: 489 R Axis:   108  Text Interpretation: Sinus rhythm Consider right atrial enlargement RVH with secondary repolarization abnrm Prolonged QT interval When compared with ECG of 03/21/2024, No significant change was found Confirmed by Raford Lenis ([id number]) on 03/31/2024 5:00:59 AM  Radiology: No results found.   Procedures   Medications Ordered in the ED  HYDROmorphone  (DILAUDID ) injection 2 mg (2 mg Intravenous Given 03/31/24 0746)  HYDROmorphone  (DILAUDID ) injection 2 mg (2 mg Intravenous Given 03/31/24 0847)  HYDROmorphone  (  DILAUDID ) injection 2 mg (2 mg Intravenous Given 03/31/24 0931)  heparin  lock flush 100 unit/mL (500 Units Intracatheter Given 03/31/24 1023)                                     33yo male with history of HIV, sickle cell disease, PE on anticoagulation who presents with concern for sickle cell pain.  No signs of acute chest syndrome, no fever, no signs of other complication.  EKG was completed and personally about interpreted by me showed no acute abnormalities.  Ordered IV fluid, pain medication. Called sickle cell clinic and spoke to [facility], recommends ED treatment and follow up outpatient.   Labs completed and personally evaluated interpreted by me show no evidence of electrolyte abnormalities, bilirubin is slightly less than prior, hemoglobin 9.1 in comparison to 7.6 on December 7.  Recommend discussion again of refills with PCP.  Patient discharged in stable condition with understanding of reasons to return.      Final diagnoses:  Sickle cell pain crisis [facility])    ED Discharge Orders     None          Dreama Longs, MD 03/31/24 [phone]

## 2024-04-03 ENCOUNTER — Other Ambulatory Visit: Payer: Self-pay

## 2024-04-10 ENCOUNTER — Other Ambulatory Visit: Payer: Self-pay | Admitting: *Deleted

## 2024-04-10 ENCOUNTER — Emergency Department (HOSPITAL_COMMUNITY)
Admission: EM | Admit: 2024-04-10 | Discharge: 2024-04-10 | Disposition: A | Discharge status: Home / Self Care | Attending: Emergency Medicine | Admitting: Emergency Medicine

## 2024-04-10 ENCOUNTER — Other Ambulatory Visit: Payer: Self-pay

## 2024-04-10 DIAGNOSIS — Z21 Asymptomatic human immunodeficiency virus [HIV] infection status: Secondary | ICD-10-CM | POA: Insufficient documentation

## 2024-04-10 DIAGNOSIS — D5700 Hb-SS disease with crisis, unspecified: Secondary | ICD-10-CM | POA: Diagnosis present

## 2024-04-10 LAB — URINALYSIS, ROUTINE W REFLEX MICROSCOPIC
Bilirubin Urine: NEGATIVE
Glucose, UA: NEGATIVE mg/dL
Hgb urine dipstick: NEGATIVE
Ketones, ur: NEGATIVE mg/dL
Leukocytes,Ua: NEGATIVE
Nitrite: NEGATIVE
Protein, ur: NEGATIVE mg/dL
Specific Gravity, Urine: 1.01 (ref 1.005–1.030)
pH: 6 (ref 5.0–8.0)

## 2024-04-10 LAB — COMPREHENSIVE METABOLIC PANEL WITH GFR
ALT: 28 U/L (ref 0–44)
AST: 48 U/L — ABNORMAL HIGH (ref 15–41)
Albumin: 3.9 g/dL (ref 3.5–5.0)
Alkaline Phosphatase: 72 U/L (ref 38–126)
Anion gap: 9 (ref 5–15)
BUN: 10 mg/dL (ref 6–20)
CO2: 23 mmol/L (ref 22–32)
Calcium: 8.6 mg/dL — ABNORMAL LOW (ref 8.9–10.3)
Chloride: 108 mmol/L (ref 98–111)
Creatinine, Ser: 0.75 mg/dL (ref 0.61–1.24)
GFR, Estimated: >60 mL/min
Glucose, Bld: 90 mg/dL (ref 70–99)
Potassium: 4 mmol/L (ref 3.5–5.1)
Sodium: 140 mmol/L (ref 135–145)
Total Bilirubin: 6.9 mg/dL — ABNORMAL HIGH (ref 0.0–1.2)
Total Protein: 7.6 g/dL (ref 6.5–8.1)

## 2024-04-10 LAB — CBC WITH DIFFERENTIAL/PLATELET
Basophils Absolute: 0.1 K/uL (ref 0.0–0.1)
Basophils Relative: 1 %
Eosinophils Absolute: 0 K/uL (ref 0.0–0.5)
Eosinophils Relative: 0 %
HCT: 24.9 % — ABNORMAL LOW (ref 39.0–52.0)
Hemoglobin: 8.8 g/dL — ABNORMAL LOW (ref 13.0–17.0)
Lymphocytes Relative: 45 %
Lymphs Abs: 2.8 K/uL (ref 0.7–4.0)
MCH: 35.1 pg — ABNORMAL HIGH (ref 26.0–34.0)
MCHC: 35.3 g/dL (ref 30.0–36.0)
MCV: 99.2 fL (ref 80.0–100.0)
Monocytes Absolute: 1.1 K/uL — ABNORMAL HIGH (ref 0.1–1.0)
Monocytes Relative: 17 %
Neutro Abs: 2.3 K/uL (ref 1.7–7.7)
Neutrophils Relative %: 37 %
Platelets: 388 K/uL (ref 150–400)
RBC: 2.51 MIL/uL — ABNORMAL LOW (ref 4.22–5.81)
RDW: 28.1 % — ABNORMAL HIGH (ref 11.5–15.5)
WBC: 6.2 K/uL (ref 4.0–10.5)
nRBC: 1 % — ABNORMAL HIGH (ref 0.0–0.2)

## 2024-04-10 LAB — RETICULOCYTES
Immature Retic Fract: 32.4 % — ABNORMAL HIGH (ref 2.3–15.9)
RBC.: 2.56 MIL/uL — ABNORMAL LOW (ref 4.22–5.81)
Retic Count, Absolute: 313.8 K/uL — ABNORMAL HIGH (ref 19.0–186.0)
Retic Ct Pct: 12.3 % — ABNORMAL HIGH (ref 0.4–3.1)

## 2024-04-10 MED ORDER — OXYCODONE-ACETAMINOPHEN 5-325 MG PO TABS
1.0000 | ORAL_TABLET | Freq: Once | ORAL | Status: AC
  Administered 2024-04-10: 1 via ORAL
  Filled 2024-04-10: qty 1

## 2024-04-10 MED ORDER — IBUPROFEN 400 MG PO TABS
600.0000 mg | ORAL_TABLET | Freq: Once | ORAL | Status: AC
  Administered 2024-04-10: 600 mg via ORAL
  Filled 2024-04-10: qty 1

## 2024-04-10 MED ORDER — DEXTROSE-SODIUM CHLORIDE 5-0.45 % IV SOLN: INTRAVENOUS | Status: DC

## 2024-04-10 MED ORDER — SODIUM CHLORIDE 0.9 % IV SOLN
25.0000 mg | Freq: Once | INTRAVENOUS | Status: AC
  Administered 2024-04-10: 25 mg via INTRAVENOUS
  Filled 2024-04-10: qty 0.5

## 2024-04-10 MED ORDER — HYDROMORPHONE HCL 1 MG/ML IJ SOLN
2.0000 mg | INTRAMUSCULAR | Status: AC
  Administered 2024-04-10: 2 mg via INTRAVENOUS
  Filled 2024-04-10: qty 2

## 2024-04-10 MED ORDER — ACETAMINOPHEN 325 MG PO TABS
650.0000 mg | ORAL_TABLET | Freq: Once | ORAL | Status: AC
  Administered 2024-04-10: 650 mg via ORAL
  Filled 2024-04-10: qty 2

## 2024-04-10 NOTE — ED Provider Triage Note (Signed)
 Emergency Medicine Provider Triage Evaluation Note  Martin [name] , a 33 y.o. male  was evaluated in triage.  Pt complains of back pain and hip pain secondary sickle crisis ongoing since yesterday not resolved by Percocet tens at home.  No fevers chills or chest pain..  Review of Systems  Positive: As above Negative: Shortness of breath  Physical Exam  BP 101/74   Pulse 77   Temp 97.7 F (36.5 C)   Resp 16   SpO2 91%  Gen:   Awake, no distress   Resp:  Normal effort  MSK:   Moves extremities without difficulty  Other:  RRR no signs respiratory.  Lungs CTAB  Medical Decision Making  Medically screening exam initiated at 3:57 AM.  Appropriate orders placed.  Martin [name] was informed that the remainder of the evaluation will be completed by another provider, this initial triage assessment does not replace that evaluation, and the importance of remaining in the ED until their evaluation is complete.  Port accessed in triage by ED RN.   This chart was dictated using voice recognition software, Dragon. Despite the best efforts of this provider to proofread and correct errors, errors may still occur which can change documentation meaning.    Jumar Greenstreet R, PA-C 04/10/24 0400

## 2024-04-10 NOTE — ED Notes (Addendum)
Patient refused to wear hospital gown.

## 2024-04-10 NOTE — ED Triage Notes (Addendum)
 Patient reports having pain in his lower back and hips that started yesterday, related to sickle cell crisis. Patient has a port.

## 2024-04-10 NOTE — ED Provider Notes (Signed)
 "  EMERGENCY DEPARTMENT AT [facility] Provider Note   CSN: [id number] Arrival date & time: 04/10/24  0231     Patient presents with: Sickle Cell Pain Crisis   Martin [name] is a 33 y.o. male.    Sickle Cell Pain Crisis Patient presents for pain in lower back and hips.  Medical history includes HIV, sickle cell anemia, anxiety, GERD.  He denies any recent injuries.  Last night, he had onset of worsened pain.  He has oxycodone  10 mg tablets at home.  He has been taking these without any relief.  He denies any recent nausea.  He has been able to drink fluids.  He denies any chest pain, fever, or shortness of breath.     Prior to Admission medications  Medication Sig Start Date End Date Taking? Authorizing Provider  abacavir -dolutegravir -lamiVUDine  (TRIUMEQ ) 600-50-300 MG tablet Take 1 tablet by mouth daily. Patient taking differently: Take 1 tablet by mouth at bedtime. 08/13/23   Pearlean Manus, MD  albuterol  (VENTOLIN  HFA) 108 (90 Base) MCG/ACT inhaler Inhale 2 puffs into the lungs every 6 (six) hours as needed for wheezing or shortness of breath. 08/13/23   Pearlean Manus, MD  amoxicillin -clavulanate (AUGMENTIN ) 875-125 MG tablet Take 1 tablet by mouth 2 (two) times daily. 03/12/24   Jegede, Olugbemiga E, MD  Cholecalciferol  (VITAMIN D3) 125 MCG (5000 UT) CAPS Take 5,000 Units by mouth every Tuesday.    [provider]  cyclobenzaprine  (FLEXERIL ) 5 MG tablet Take 1 tablet (5 mg total) by mouth 2 (two) times daily as needed for muscle spasms. 02/04/24   [name], [name], [name]  dabigatran  (PRADAXA ) 150 MG CAPS capsule Take 1 capsule (150 mg total) by mouth 2 (two) times daily. 02/09/24   Jegede, Olugbemiga E, MD  dabigatran  (PRADAXA ) 150 MG CAPS capsule Take 1 capsule (150 mg total) by mouth every 12 (twelve) hours. Patient not taking: No sig reported 02/16/24   Cherylene [name] HERO, NP  Deferasirox  (JADENU ) 360 MG TABS Take 2 tablets (720 mg total)  by mouth daily. 11/05/23   [name], [name], [name]  diphenhydrAMINE  (BENADRYL ) 25 MG tablet Take 1 tablet (25 mg total) by mouth every 4 (four) hours as needed for itching. Patient taking differently: Take 25 mg by mouth daily as needed for itching. 08/13/23   Pearlean Manus, MD  folic acid  (FOLVITE ) 400 MCG tablet Take 1 tablet (400 mcg total) by mouth daily. 08/13/23   Pearlean Manus, MD  gabapentin  (NEURONTIN ) 300 MG capsule Take 1 capsule (300 mg total) by mouth at bedtime as needed (nerve pain). 08/13/23   Pearlean Manus, MD  guaiFENesin  (MUCINEX ) 600 MG 12 hr tablet Take 1 tablet (600 mg total) by mouth 2 (two) times daily. Patient taking differently: Take 600 mg by mouth 2 (two) times daily as needed for cough or to loosen phlegm. 08/13/23 08/12/24  Pearlean Manus, MD  guaiFENesin -dextromethorphan  (ROBITUSSIN DM) 100-10 MG/5ML syrup Take 10 mLs by mouth every 4 (four) hours as needed for cough. 03/12/24   Jegede, Olugbemiga E, MD  hydroxyurea  (HYDREA ) 500 MG capsule Take 4 capsules (2,000 mg total) by mouth at bedtime. 08/13/23   Pearlean Manus, MD  NARCAN  4 MG/0.1ML LIQD nasal spray kit Place 1 spray into the nose as needed (respiratory distress / opioid overdose). 01/07/23   [provider]  oxyCODONE -acetaminophen  (PERCOCET) 10-325 MG tablet Take 1 tablet by mouth every 4 (four) hours as needed for pain. 03/31/24   [name], [name], [name]  pantoprazole  (  PROTONIX ) 40 MG tablet Take 1 tablet (40 mg total) by mouth daily as needed (acid reflux). 08/13/23   Pearlean Manus, MD  senna-docusate (SENOKOT-S) 8.6-50 MG tablet Take 2 tablets by mouth at bedtime. Patient not taking: Reported on 03/04/2024 08/13/23   Pearlean Manus, MD    Allergies: Ketorolac  tromethamine , Tape, Wound dressing adhesive, and Bactoshield chg [chlorhexidine  gluconate]    Review of Systems  Musculoskeletal:  Positive for arthralgias, back pain and myalgias.  All other systems reviewed and are  negative.   Updated Vital Signs BP 122/71   Pulse 83   Temp 98.3 F (36.8 C) (Temporal)   Resp 16   Ht 6' 3 (1.905 m)   Wt 63.5 kg   SpO2 94%   BMI 17.50 kg/m   Physical Exam Vitals and nursing note reviewed.  Constitutional:      General: He is not in acute distress.    Appearance: Normal appearance. He is well-developed. He is not ill-appearing, toxic-appearing or diaphoretic.  HENT:     Head: Normocephalic and atraumatic.     Right Ear: External ear normal.     Left Ear: External ear normal.     Nose: Nose normal.     Mouth/Throat:     Mouth: Mucous membranes are moist.  Eyes:     Extraocular Movements: Extraocular movements intact.     Conjunctiva/sclera: Conjunctivae normal.  Cardiovascular:     Rate and Rhythm: Normal rate and regular rhythm.     Heart sounds: No murmur heard. Pulmonary:     Effort: Pulmonary effort is normal. No respiratory distress.     Breath sounds: Normal breath sounds. No wheezing or rales.  Chest:     Chest wall: No tenderness.  Abdominal:     General: There is no distension.     Palpations: Abdomen is soft.     Tenderness: There is no abdominal tenderness.  Musculoskeletal:        General: No swelling or deformity.     Cervical back: Normal range of motion and neck supple.     Right lower leg: No edema.     Left lower leg: No edema.  Skin:    General: Skin is warm and dry.     Coloration: Skin is not jaundiced or pale.  Neurological:     General: No focal deficit present.     Mental Status: He is alert and oriented to person, place, and time.     Cranial Nerves: No cranial nerve deficit.     Sensory: No sensory deficit.     Motor: No weakness.     Coordination: Coordination normal.  Psychiatric:        Mood and Affect: Mood normal.        Behavior: Behavior normal.     (all labs ordered are listed, but only abnormal results are displayed) Labs Reviewed  COMPREHENSIVE METABOLIC PANEL WITH GFR - Abnormal; Notable for the  following components:      Result Value   Calcium  8.6 (*)    AST 48 (*)    Total Bilirubin 6.9 (*)    All other components within normal limits  CBC WITH DIFFERENTIAL/PLATELET - Abnormal; Notable for the following components:   RBC 2.51 (*)    Hemoglobin 8.8 (*)    HCT 24.9 (*)    MCH 35.1 (*)    RDW 28.1 (*)    nRBC 1.0 (*)    Monocytes Absolute 1.1 (*)    All other components  within normal limits  RETICULOCYTES - Abnormal; Notable for the following components:   Retic Ct Pct 12.3 (*)    RBC. 2.56 (*)    Retic Count, Absolute 313.8 (*)    Immature Retic Fract 32.4 (*)    All other components within normal limits  URINALYSIS, ROUTINE W REFLEX MICROSCOPIC    EKG: EKG Interpretation Date/Time:  Monday April 10 2024 05:26:17 EST Ventricular Rate:  68 PR Interval:  191 QRS Duration:  89 QT Interval:  448 QTC Calculation: 477 R Axis:   96  Text Interpretation: Sinus rhythm RVH with secondary repolarization abnrm Nonspecific T abnormalities, lateral leads Borderline prolonged QT interval Confirmed by Melvenia Motto ([id number]) on 04/10/2024 5:39:03 AM  Radiology: No results found.   Procedures   Medications Ordered in the ED  dextrose  5 % and 0.45 % NaCl infusion ( Intravenous New Bag/Given 04/10/24 0536)  oxyCODONE -acetaminophen  (PERCOCET/ROXICET) 5-325 MG per tablet 1 tablet (has no administration in time range)  HYDROmorphone  (DILAUDID ) injection 2 mg (2 mg Intravenous Given 04/10/24 0529)  HYDROmorphone  (DILAUDID ) injection 2 mg (2 mg Intravenous Given 04/10/24 0616)  HYDROmorphone  (DILAUDID ) injection 2 mg (2 mg Intravenous Given 04/10/24 0645)  diphenhydrAMINE  (BENADRYL ) 25 mg in sodium chloride  0.9 % 50 mL IVPB (0 mg Intravenous Stopped 04/10/24 0633)  acetaminophen  (TYLENOL ) tablet 650 mg (650 mg Oral Given 04/10/24 0529)  ibuprofen  (ADVIL ) tablet 600 mg (600 mg Oral Given 04/10/24 0529)                                    Medical Decision Making Amount and/or  Complexity of Data Reviewed Labs: ordered.  Risk OTC drugs. Prescription drug management.   This patient presents to the ED for concern of pain in back and hips, this involves an extensive number of treatment options, and is a complaint that carries with it a high risk of complications and morbidity.  The differential diagnosis includes occult injury, sickle cell pain crisis, chronic pain, opiate withdrawal, avascular necrosis   Co morbidities / Chronic conditions that complicate the patient evaluation  HIV, sickle cell anemia, anxiety, GERD   Additional history obtained:  Additional history obtained from EMR External records from outside source obtained and reviewed including N/A   Lab Tests:  I Ordered, and personally interpreted labs.  The pertinent results include: Slight anemia, no leukocytosis, normal kidney function, normal electrolytes  Cardiac Monitoring: / EKG:  The patient was maintained on a cardiac monitor.  I personally viewed and interpreted the cardiac monitored which showed an underlying rhythm of: Sinus rhythm   Problem List / ED Course / Critical interventions / Medication management  Patient presenting for pain in lower back and bilateral hips.  Symptoms are consistent with prior episodes of sickle cell anemia pain.  Pain has been refractory to his home oxycodone  tablets.  He denies any recent nausea, chest pain, shortness of breath, or fevers.  On exam, he is overall well-appearing.  Vital signs are normal.  Current SpO2 is normal on room air.  His breathing is unlabored and lungs are clear to auscultation.  Per chart review, he was last seen in the ED for similar symptoms 10 days ago.  2 weeks ago, he was admitted.  Lab work and pain medication were ordered.  Lab work is reassuring.  After multiple doses of narcotic pain medication, patient did have improved symptoms.  He does request discharge home at this  time.  He was discharged in stable condition. I  ordered medication including Tylenol , Dilaudid , Percocet for analgesia; IV fluids for hydration Reevaluation of the patient after these medicines showed that the patient improved I have reviewed the patients home medicines and have made adjustments as needed  Test / Admission - Considered:  Frequent ED visits and hospitalizations      Final diagnoses:  Sickle cell anemia with pain [facility])    ED Discharge Orders     None          Melvenia Motto, MD 04/10/24 [phone]  "

## 2024-04-10 NOTE — Discharge Instructions (Addendum)
 Continue home medications as needed for pain.  Return to the emergency department for any new or worsening symptoms of concern.

## 2024-04-10 NOTE — Patient Instructions (Signed)
 Martin [name] - I am sorry I was unable to reach you today for our scheduled appointment. I work with [name], [name], [name] and am calling to support your healthcare needs. Please contact me at [phone] at your earliest convenience. I look forward to speaking with you soon.   Thank you,  Zechariah Bissonnette, RN, BSN, ACM RN Care Manager [employer] [phone]

## 2024-04-10 NOTE — ED Notes (Signed)
 Porta cath accessed by triage RN .

## 2024-04-12 ENCOUNTER — Other Ambulatory Visit: Payer: Self-pay

## 2024-04-12 ENCOUNTER — Other Ambulatory Visit: Payer: Self-pay | Admitting: Nurse Practitioner

## 2024-04-12 ENCOUNTER — Other Ambulatory Visit (HOSPITAL_COMMUNITY): Payer: Self-pay

## 2024-04-12 DIAGNOSIS — D571 Sickle-cell disease without crisis: Secondary | ICD-10-CM

## 2024-04-12 DIAGNOSIS — F1190 Opioid use, unspecified, uncomplicated: Secondary | ICD-10-CM

## 2024-04-12 MED ORDER — OXYCODONE-ACETAMINOPHEN 10-325 MG PO TABS
1.0000 | ORAL_TABLET | ORAL | 0 refills | Status: DC | PRN
  Filled 2024-04-15: qty 90, 15d supply, fill #0

## 2024-04-12 NOTE — Telephone Encounter (Unsigned)
 Copied from CRM #[id number]. Topic: Clinical - Medication Refill >> Apr 12, 2024  9:26 AM Harlene ORN wrote: Medication: oxyCODONE -acetaminophen  (PERCOCET) 10-325 MG tablet  Has the patient contacted their pharmacy? Yes (Agent: If no, request that the patient contact the pharmacy for the refill. If patient does not wish to contact the pharmacy document the reason why and proceed with request.) (Agent: If yes, when and what did the pharmacy advise?)  This is the patient's preferred pharmacy:  [facility] [address] [place] [address] Phone: [phone] Fax: [phone]  Is this the correct pharmacy for this prescription? Yes If no, delete pharmacy and type the correct one.   Has the prescription been filled recently? Yes  Is the patient out of the medication? No  Has the patient been seen for an appointment in the last year OR does the patient have an upcoming appointment? Yes  Can we respond through MyChart? Yes  Agent: Please be advised that Rx refills may take up to 3 business days. We ask that you follow-up with your pharmacy.

## 2024-04-12 NOTE — Telephone Encounter (Signed)
 Please advise

## 2024-04-14 NOTE — Telephone Encounter (Signed)
 Patient was called and made aware that his medication was too early to fill and it would be filled on 12/27.

## 2024-04-15 ENCOUNTER — Other Ambulatory Visit: Payer: Self-pay

## 2024-04-15 ENCOUNTER — Emergency Department (HOSPITAL_BASED_OUTPATIENT_CLINIC_OR_DEPARTMENT_OTHER)
Admission: EM | Admit: 2024-04-15 | Discharge: 2024-04-15 | Disposition: A | Discharge status: Home / Self Care | Attending: Emergency Medicine | Admitting: Emergency Medicine

## 2024-04-15 ENCOUNTER — Encounter (HOSPITAL_BASED_OUTPATIENT_CLINIC_OR_DEPARTMENT_OTHER): Payer: Self-pay

## 2024-04-15 ENCOUNTER — Other Ambulatory Visit (HOSPITAL_COMMUNITY): Payer: Self-pay

## 2024-04-15 DIAGNOSIS — D5700 Hb-SS disease with crisis, unspecified: Secondary | ICD-10-CM | POA: Insufficient documentation

## 2024-04-15 DIAGNOSIS — M545 Low back pain, unspecified: Secondary | ICD-10-CM | POA: Diagnosis present

## 2024-04-15 LAB — BASIC METABOLIC PANEL WITH GFR
Anion gap: 9 (ref 5–15)
BUN: 9 mg/dL (ref 6–20)
CO2: 25 mmol/L (ref 22–32)
Calcium: 8.9 mg/dL (ref 8.9–10.3)
Chloride: 108 mmol/L (ref 98–111)
Creatinine, Ser: 0.8 mg/dL (ref 0.61–1.24)
GFR, Estimated: >60 mL/min
Glucose, Bld: 87 mg/dL (ref 70–99)
Potassium: 3.8 mmol/L (ref 3.5–5.1)
Sodium: 142 mmol/L (ref 135–145)

## 2024-04-15 LAB — RETICULOCYTES
Immature Retic Fract: 33.5 % — ABNORMAL HIGH (ref 2.3–15.9)
RBC.: 2.7 MIL/uL — ABNORMAL LOW (ref 4.22–5.81)
Retic Count, Absolute: 485.5 K/uL — ABNORMAL HIGH (ref 19.0–186.0)
Retic Ct Pct: 18 % — ABNORMAL HIGH (ref 0.4–3.1)

## 2024-04-15 LAB — CBC WITH DIFFERENTIAL/PLATELET
Abs Immature Granulocytes: 0.08 K/uL — ABNORMAL HIGH (ref 0.00–0.07)
Basophils Absolute: 0 K/uL (ref 0.0–0.1)
Basophils Relative: 0 %
Eosinophils Absolute: 0.1 K/uL (ref 0.0–0.5)
Eosinophils Relative: 1 %
HCT: 23.4 % — ABNORMAL LOW (ref 39.0–52.0)
Hemoglobin: 8.2 g/dL — ABNORMAL LOW (ref 13.0–17.0)
Immature Granulocytes: 1 %
Lymphocytes Relative: 29 %
Lymphs Abs: 2.7 K/uL (ref 0.7–4.0)
MCH: 34.2 pg — ABNORMAL HIGH (ref 26.0–34.0)
MCHC: 35 g/dL (ref 30.0–36.0)
MCV: 97.5 fL (ref 80.0–100.0)
Monocytes Absolute: 1.2 K/uL — ABNORMAL HIGH (ref 0.1–1.0)
Monocytes Relative: 13 %
Neutro Abs: 5.3 K/uL (ref 1.7–7.7)
Neutrophils Relative %: 56 %
Platelet Morphology: NORMAL
Platelets: 341 K/uL (ref 150–400)
RBC: 2.4 MIL/uL — ABNORMAL LOW (ref 4.22–5.81)
RDW: 27.7 % — ABNORMAL HIGH (ref 11.5–15.5)
WBC: 9.4 K/uL (ref 4.0–10.5)
nRBC: 13.7 % — ABNORMAL HIGH (ref 0.0–0.2)

## 2024-04-15 MED ORDER — SODIUM CHLORIDE 0.9 % IV SOLN
12.5000 mg | Freq: Once | INTRAVENOUS | Status: AC
  Administered 2024-04-15: 12.5 mg via INTRAVENOUS
  Filled 2024-04-15: qty 0.25

## 2024-04-15 MED ORDER — OXYCODONE HCL 5 MG PO TABS
15.0000 mg | ORAL_TABLET | Freq: Once | ORAL | Status: AC
  Administered 2024-04-15: 15 mg via ORAL
  Filled 2024-04-15: qty 3

## 2024-04-15 MED ORDER — HEPARIN SOD (PORK) LOCK FLUSH 100 UNIT/ML IV SOLN
500.0000 [IU] | Freq: Once | INTRAVENOUS | Status: AC
  Administered 2024-04-15: 500 [IU]
  Filled 2024-04-15: qty 5

## 2024-04-15 MED ORDER — HYDROMORPHONE HCL 1 MG/ML IJ SOLN
2.0000 mg | INTRAMUSCULAR | Status: AC
  Administered 2024-04-15: 2 mg via INTRAVENOUS
  Filled 2024-04-15: qty 2

## 2024-04-15 MED ORDER — DIPHENHYDRAMINE HCL 50 MG/ML IJ SOLN
INTRAMUSCULAR | Status: AC
  Filled 2024-04-15: qty 1

## 2024-04-15 NOTE — ED Notes (Addendum)
 Pt placed on 4lt [place] due to SATS of 84%. SATS now 91%

## 2024-04-15 NOTE — ED Triage Notes (Addendum)
 Pt to ED from home with c/o SSC which began earlier last night, no relief from home meds.Pt endorses lower back and hip pain. Pt arrives A+O, RA sat in the 80s, all other vss., NADN.

## 2024-04-15 NOTE — Discharge Instructions (Signed)
 Follow-up with your PCP or the sickle cell infusion center or if you have severe worsening symptoms the emergency department.

## 2024-04-15 NOTE — ED Provider Notes (Addendum)
 " [place] EMERGENCY DEPARTMENT AT [facility] Provider Note   CSN: [id number] Arrival date & time: 04/15/24  9485     Patient presents with: Sickle Cell Pain Crisis   Martin [name] is a 33 y.o. male.    Sickle Cell Pain Crisis    33 year old male with medical history significant for sickle cell anemia who presents to the emergency department with concern for pain associated with his sickle cell disease.  The patient endorses lower back pain and hip pain consistent with known pain associated with a sickle cell disease.  He denies any fever, chills, cough.  He denies any shortness of breath or chest pain.  No known sick contacts.  He has been taking home Percocet without any significant relief.  Prior to Admission medications  Medication Sig Start Date End Date Taking? Authorizing Provider  abacavir -dolutegravir -lamiVUDine  (TRIUMEQ ) 600-50-300 MG tablet Take 1 tablet by mouth daily. Patient taking differently: Take 1 tablet by mouth at bedtime. 08/13/23   Pearlean Manus, MD  albuterol  (VENTOLIN  HFA) 108 (90 Base) MCG/ACT inhaler Inhale 2 puffs into the lungs every 6 (six) hours as needed for wheezing or shortness of breath. 08/13/23   Pearlean Manus, MD  amoxicillin -clavulanate (AUGMENTIN ) 875-125 MG tablet Take 1 tablet by mouth 2 (two) times daily. 03/12/24   Jegede, Olugbemiga E, MD  Cholecalciferol  (VITAMIN D3) 125 MCG (5000 UT) CAPS Take 5,000 Units by mouth every Tuesday.    [provider]  cyclobenzaprine  (FLEXERIL ) 5 MG tablet Take 1 tablet (5 mg total) by mouth 2 (two) times daily as needed for muscle spasms. 02/04/24   [name], [name], [name]  dabigatran  (PRADAXA ) 150 MG CAPS capsule Take 1 capsule (150 mg total) by mouth 2 (two) times daily. 02/09/24   Jegede, Olugbemiga E, MD  dabigatran  (PRADAXA ) 150 MG CAPS capsule Take 1 capsule (150 mg total) by mouth every 12 (twelve) hours. Patient not taking: No sig reported 02/16/24   Cherylene [name] HERO, NP  Deferasirox  (JADENU ) 360 MG TABS Take 2 tablets (720 mg total) by mouth daily. 11/05/23   [name], [name], [name]  diphenhydrAMINE  (BENADRYL ) 25 MG tablet Take 1 tablet (25 mg total) by mouth every 4 (four) hours as needed for itching. Patient taking differently: Take 25 mg by mouth daily as needed for itching. 08/13/23   Pearlean Manus, MD  folic acid  (FOLVITE ) 400 MCG tablet Take 1 tablet (400 mcg total) by mouth daily. 08/13/23   Pearlean Manus, MD  gabapentin  (NEURONTIN ) 300 MG capsule Take 1 capsule (300 mg total) by mouth at bedtime as needed (nerve pain). 08/13/23   Pearlean Manus, MD  guaiFENesin  (MUCINEX ) 600 MG 12 hr tablet Take 1 tablet (600 mg total) by mouth 2 (two) times daily. Patient taking differently: Take 600 mg by mouth 2 (two) times daily as needed for cough or to loosen phlegm. 08/13/23 08/12/24  Pearlean Manus, MD  guaiFENesin -dextromethorphan  (ROBITUSSIN DM) 100-10 MG/5ML syrup Take 10 mLs by mouth every 4 (four) hours as needed for cough. 03/12/24   Jegede, Olugbemiga E, MD  hydroxyurea  (HYDREA ) 500 MG capsule Take 4 capsules (2,000 mg total) by mouth at bedtime. 08/13/23   Pearlean Manus, MD  NARCAN  4 MG/0.1ML LIQD nasal spray kit Place 1 spray into the nose as needed (respiratory distress / opioid overdose). 01/07/23   [provider]  oxyCODONE -acetaminophen  (PERCOCET) 10-325 MG tablet Take 1 tablet by mouth every 4 (four) hours as needed for pain. 04/15/24   [name], [name], [name]  pantoprazole  (PROTONIX ) 40 MG tablet Take 1 tablet (40 mg total) by mouth daily as needed (acid reflux). 08/13/23   Pearlean Manus, MD  senna-docusate (SENOKOT-S) 8.6-50 MG tablet Take 2 tablets by mouth at bedtime. Patient not taking: Reported on 03/04/2024 08/13/23   Pearlean Manus, MD    Allergies: Ketorolac  tromethamine , Tape, Wound dressing adhesive, and Bactoshield chg [chlorhexidine  gluconate]    Review of Systems  All other systems reviewed and are  negative.   Updated Vital Signs BP 115/81 (BP Location: Right Arm)   Pulse 86   Temp 98.2 F (36.8 C) (Oral)   Resp 20   Ht 6' 3 (1.905 m)   Wt 63.5 kg   SpO2 95%   BMI 17.50 kg/m   Physical Exam Vitals and nursing note reviewed.  Constitutional:      General: He is not in acute distress. HENT:     Head: Normocephalic and atraumatic.  Eyes:     Conjunctiva/sclera: Conjunctivae normal.     Pupils: Pupils are equal, round, and reactive to light.  Cardiovascular:     Rate and Rhythm: Normal rate and regular rhythm.     Heart sounds: Normal heart sounds.  Pulmonary:     Effort: Pulmonary effort is normal. No respiratory distress.     Breath sounds: Normal breath sounds.  Abdominal:     General: There is no distension.     Tenderness: There is no guarding.  Musculoskeletal:        General: No deformity or signs of injury.     Cervical back: Neck supple.  Skin:    Findings: No lesion or rash.  Neurological:     General: No focal deficit present.     Mental Status: He is alert. Mental status is at baseline.     (all labs ordered are listed, but only abnormal results are displayed) Labs Reviewed  CBC WITH DIFFERENTIAL/PLATELET - Abnormal; Notable for the following components:      Result Value   RBC 2.40 (*)    Hemoglobin 8.2 (*)    HCT 23.4 (*)    MCH 34.2 (*)    RDW 27.7 (*)    nRBC 13.7 (*)    Monocytes Absolute 1.2 (*)    Abs Immature Granulocytes 0.08 (*)    All other components within normal limits  RETICULOCYTES - Abnormal; Notable for the following components:   Retic Ct Pct 18.0 (*)    RBC. 2.70 (*)    Retic Count, Absolute 485.5 (*)    Immature Retic Fract 33.5 (*)    All other components within normal limits  BASIC METABOLIC PANEL WITH GFR    EKG: None  Radiology: No results found.   Procedures   Medications Ordered in the ED  HYDROmorphone  (DILAUDID ) injection 2 mg (2 mg Intravenous Given 04/15/24 0612)  HYDROmorphone  (DILAUDID )  injection 2 mg (2 mg Intravenous Given 04/15/24 0651)  HYDROmorphone  (DILAUDID ) injection 2 mg (2 mg Intravenous Given 04/15/24 0734)  oxyCODONE  (Oxy IR/ROXICODONE ) immediate release tablet 15 mg (15 mg Oral Given 04/15/24 0800)  diphenhydrAMINE  (BENADRYL ) 12.5 mg in sodium chloride  0.9 % 50 mL IVPB (0 mg Intravenous Stopped 04/15/24 0646)  heparin  lock flush 100 unit/mL (500 Units Intracatheter Given 04/15/24 0802)                                    Medical Decision Making Amount and/or Complexity of Data Reviewed  Labs: ordered.  Risk Prescription drug management.    33 year old male with medical history significant for sickle cell anemia who presents to the emergency department with concern for pain associated with his sickle cell disease.  The patient endorses lower back pain and hip pain consistent with known pain associated with a sickle cell disease.  He denies any fever, chills, cough.  He denies any shortness of breath or chest pain.  No known sick contacts.  He has been taking home Percocet without any significant relief.  On arrival, the patient was afebrile, not tachycardic or tachypneic, initially saturating 84% on room air, subsequently placed on 2 L O2 via nasal cannula.  Patient presenting with pain associated with sickle cell disease.  No care plan in the patient's chart.  Will initiate the patient on pain management and obtain labs.  Low concern for acute chest syndrome.  CBC, BMP, reticulocytes resulted with a hemoglobin stable at 8.2, BMP unremarkable, reticulocyte count elevated but somewhat close to the patient's baseline.  On repeat assessment, the patient was feeling symptomatically improved.  Feels comfortable at this time for discharge with continued outpatient follow-up.  Return precautions were provided, low concern for acute chest syndrome or other acute abnormality requiring further diagnostic testing in the emergency department.  Stable for discharge and  outpatient follow-up.     Final diagnoses:  Sickle cell anemia with pain [facility])    ED Discharge Orders     None          Jerrol Agent, MD 04/15/24 [id number]    Jerrol Agent, MD 04/15/24 1642  "

## 2024-04-17 ENCOUNTER — Other Ambulatory Visit: Payer: Self-pay

## 2024-04-17 ENCOUNTER — Other Ambulatory Visit (INDEPENDENT_AMBULATORY_CARE_PROVIDER_SITE_OTHER): Payer: Self-pay

## 2024-04-17 DIAGNOSIS — I2609 Other pulmonary embolism with acute cor pulmonale: Secondary | ICD-10-CM

## 2024-04-17 NOTE — Progress Notes (Signed)
 "  04/17/2024 Name: Martin [name] MRN: [id number] DOB: 01-04-1991  Chief Complaint  Patient presents with   Medication Access    Eliquis     Martin [name] is a 33 y.o. year old male who presented for a telephone visit.   They were referred to the pharmacist by their PCP for assistance in managing medication access. PMH includes hx of PE, sickle cell disease, anxiety, HIV.   Subjective: Patient was last seen by PCP, [name], NP, on 02/04/24. 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 Per history - patient was hospitalized for recurrent PE in Sept 2025 in the setting of inconsistent adherence to Eliquis  (patient reported missing ~25% of doses). He was switched to Xarelto  at discharge for once daily dosing, but was unable to obtain due to lack of insurance (had [place] Medicaid). He had recurrent hospitalizations for sickle cell and worsening ShOB with PE and was eventually switched to Pradaxa  for cost and question of therapeutic failure on Eliquis . At pharmacy call on 03/29/24, patient stated he would not be able to afford Pradaxa  at $56.55/30ds. He stated he had 5 bottles of Eliquis  left. PCP confirmed that it would be ok for him to switch back to Eliquis  given recurrent PE was likely due to nonadherence rather than a therapeutic failure. We initiated Eliquis  application via email.   Today, patient reports doing ok. Confirms he got the email with Eliquis  application. Says he filled it out. Will confirm he sent it back. He had not seen that it needed a signature. Can consider getting signature at PCP appt on 05/09/23. No update from Medicaid, has not been to [facility] office.  Care Team: Primary Care Provider: [name], [name], [name] ; Next Scheduled Visit: 05/08/24  Medication Access/Adherence  Current Pharmacy:  [facility] [address] [place] [address] Phone: [phone] Fax:  [phone]   Patient reports affordability concerns with their medications: Yes  - applied for [place] Medicaid about 30 days ago, but has not heard back about application. Unclear whether [place] Medicaid is active- he thinks it is expired, and therefore could be eligible for BMS PAP for Eliquis  if he has no insurance coverage. Patient reports access/transportation concerns to their pharmacy: No  - states he could get to [facility] office. Patient reports adherence concerns with their medications:  Yes  - hx of nonadherence to twice daily medications like Eliquis . Denies missed doses of BID Pradaxa .   Objective:  BP Readings from Last 3 Encounters:  04/15/24 115/81  04/10/24 122/71  03/31/24 132/84    Lab Results  Component Value Date   HGBA1C (L) 05/22/2009    <4.0 (NOTE) The ADA recommends the following therapeutic goal for glycemic control related to Hgb A1c measurement: Goal of therapy: <6.5 Hgb A1c  Reference: American Diabetes Association: Clinical Practice Recommendations 2010, Diabetes Care, 2010, 33: (Suppl  1).       Latest Ref Rng & Units 04/15/2024    5:59 AM 04/10/2024    4:27 AM 03/31/2024    6:20 AM  BMP  Glucose 70 - 99 mg/dL 87  90  74   BUN 6 - 20 mg/dL 9  10  17    Creatinine 0.61 - 1.24 mg/dL 9.19  9.24  9.22   Sodium 135 - 145 mmol/L 142  140  142   Potassium 3.5 - 5.1 mmol/L 3.8  4.0  4.0   Chloride 98 - 111 mmol/L  108  108  109   CO2 22 - 32 mmol/L 25  23  24    Calcium  8.9 - 10.3 mg/dL 8.9  8.6  8.8     No results found for: CHOL, HDL, LDLCALC, LDLDIRECT, [name], CHOLHDL  Medications Reviewed Today     Reviewed by Brinda Lorain SQUIBB, RPH-CPP (Pharmacist) on 04/17/24 at 1231  Med List Status: <None>   Medication Order Taking? Sig Documenting Provider Last Dose Status Informant  abacavir -dolutegravir -lamiVUDine  (TRIUMEQ ) 600-50-300 MG tablet [id number]  Take 1 tablet by mouth daily.  Patient taking differently: Take 1 tablet by mouth at bedtime.    Pearlean Manus, MD  Active Self           Med Note (CRUTHIS, CHLOE C   Sat Mar 04, 2024 11:41 AM) No fill hx found for this medication. Pt is adamant he is taking it.   albuterol  (VENTOLIN  HFA) 108 (90 Base) MCG/ACT inhaler [id number]  Inhale 2 puffs into the lungs every 6 (six) hours as needed for wheezing or shortness of breath. Pearlean Manus, MD  Active Self           Med Note (CRUTHIS, CHLOE C   Tue Feb 29, 2024  7:23 AM)    amoxicillin -clavulanate (AUGMENTIN ) 875-125 MG tablet [id number]  Take 1 tablet by mouth 2 (two) times daily. Jegede, Olugbemiga E, MD  Active Self  apixaban  (ELIQUIS ) 5 MG TABS tablet [id number] Yes Take 5 mg by mouth 2 (two) times daily. [provider]  Active   Cholecalciferol  (VITAMIN D3) 125 MCG (5000 UT) CAPS [id number]  Take 5,000 Units by mouth every Tuesday. [provider]  Active Self           Med Note (CRUTHIS, CHLOE C   Tue Feb 29, 2024  7:23 AM)    cyclobenzaprine  (FLEXERIL ) 5 MG tablet [id number]  Take 1 tablet (5 mg total) by mouth 2 (two) times daily as needed for muscle spasms. [name], [name], [name]  Active Self           Med Note (CRUTHIS, CHLOE C   Sat Mar 04, 2024 11:41 AM) No fill hx found for this medication. Pt is adamant he is taking it.     Discontinued 04/17/24 1231 (Change in therapy)          Med Note>> Brinda Lorain SQUIBB, RPH-CPP   04/17/2024 12:31 PM Unable to afford - switching back to Eliquis      Patient not taking:   Discontinued 04/17/24 1231 (Change in therapy)          Med Note>> Lang Waddell DEL, CPhT   03/20/2024  9:35 AM Unable to afford - switching back to Eliquis     Deferasirox  (JADENU ) 360 MG TABS [id number]  Take 2 tablets (720 mg total) by mouth daily. [name], [name], [name]  Active Self           Med Note JACKOLYN WADDELL DEL Pablo Mar 20, 2024  9:35 AM) No dispenses.   diphenhydrAMINE  (BENADRYL ) 25 MG tablet [id number]  Take 1 tablet (25 mg total) by mouth every 4 (four) hours as needed for itching.   Patient taking differently: Take 25 mg by mouth daily as needed for itching.   Pearlean Manus, MD  Active Self  folic acid  (FOLVITE ) 400 MCG tablet [id number]  Take 1 tablet (400 mcg total) by mouth daily. Pearlean Manus, MD  Active Self           Med Note (CRUTHIS, CHLOE C  Tue Feb 29, 2024  7:23 AM)    gabapentin  (NEURONTIN ) 300 MG capsule [id number]  Take 1 capsule (300 mg total) by mouth at bedtime as needed (nerve pain). Pearlean Manus, MD  Active Self           Med Note (CRUTHIS, CHLOE C   Sat Mar 04, 2024 11:43 AM) No fill hx found for this medication. Pt is adamant he is taking it.   guaiFENesin  (MUCINEX ) 600 MG 12 hr tablet [id number]  Take 1 tablet (600 mg total) by mouth 2 (two) times daily.  Patient taking differently: Take 600 mg by mouth 2 (two) times daily as needed for cough or to loosen phlegm.   Pearlean Manus, MD  Active Self  guaiFENesin -dextromethorphan  (ROBITUSSIN DM) 100-10 MG/5ML syrup [id number]  Take 10 mLs by mouth every 4 (four) hours as needed for cough. Jegede, Olugbemiga E, MD  Active Self  hydroxyurea  (HYDREA ) 500 MG capsule [id number]  Take 4 capsules (2,000 mg total) by mouth at bedtime. Pearlean Manus, MD  Active Self           Med Note (CRUTHIS, CHLOE C   Sat Mar 04, 2024 11:44 AM) No fill hx found for this medication. Pt is adamant he is taking it.   NARCAN  4 MG/0.1ML LIQD nasal spray kit [id number]  Place 1 spray into the nose as needed (respiratory distress / opioid overdose). [provider]  Active Self           Med Note MARISA, NATHANEL SAILOR   Tue Aug 10, 2023  2:21 PM)    oxyCODONE -acetaminophen  (PERCOCET) 10-325 MG tablet [id number]  Take 1 tablet by mouth every 4 (four) hours as needed for pain. [name], [name], [name]  Active   pantoprazole  (PROTONIX ) 40 MG tablet [id number]  Take 1 tablet (40 mg total) by mouth daily as needed (acid reflux). Pearlean Manus, MD  Active Self           Med Note (CRUTHIS, CHLOE C   Sat Mar 04, 2024 11:44 AM)  No fill hx found for this medication. Pt is adamant he is taking it.   senna-docusate (SENOKOT-S) 8.6-50 MG tablet [id number]  Take 2 tablets by mouth at bedtime.  Patient not taking: Reported on 03/04/2024   Pearlean Manus, MD  Active Self              Assessment/Plan:   Medication Management: - Currently strategy insufficient to maintain appropriate adherence to prescribed medication regimen due to lack of insurance coverage for Eliquis  and Pradaxa  unaffordable. Encouraged patient to mail back Eliquis  BMS PAP application to [employer]. Will confirm we have all information and written signature at PCP appt on 05/08/24. - Advised to visit [facility] Medicaid office to follow-up on Medicaid application. If approved for Medicaid, would pursue once daily Xarelto  for improved adherence. Would need to advise patient to take with a large meal to improved absorption and prevent recurrent event.  Written patient instructions provided. Patient verbalized understanding of treatment plan.   Follow Up Plan:  Pharmacist telephone 06/06/24 PCP clinic visit 05/08/24   Lorain Baseman, PharmD [facility] [phone]   "

## 2024-04-18 ENCOUNTER — Emergency Department (HOSPITAL_COMMUNITY)
Admission: EM | Admit: 2024-04-18 | Discharge: 2024-04-18 | Attending: Emergency Medicine | Admitting: Emergency Medicine

## 2024-04-18 ENCOUNTER — Other Ambulatory Visit: Payer: Self-pay

## 2024-04-18 ENCOUNTER — Telehealth (HOSPITAL_COMMUNITY): Payer: Self-pay | Admitting: *Deleted

## 2024-04-18 ENCOUNTER — Non-Acute Institutional Stay (HOSPITAL_COMMUNITY)
Admission: AD | Admit: 2024-04-18 | Discharge: 2024-04-18 | Disposition: A | Discharge status: Home / Self Care | Source: Ambulatory Visit | Attending: Internal Medicine | Admitting: Internal Medicine

## 2024-04-18 DIAGNOSIS — Z79899 Other long term (current) drug therapy: Secondary | ICD-10-CM | POA: Diagnosis not present

## 2024-04-18 DIAGNOSIS — Z79624 Long term (current) use of inhibitors of nucleotide synthesis: Secondary | ICD-10-CM | POA: Diagnosis not present

## 2024-04-18 DIAGNOSIS — D57219 Sickle-cell/Hb-C disease with crisis, unspecified: Secondary | ICD-10-CM | POA: Diagnosis not present

## 2024-04-18 DIAGNOSIS — F411 Generalized anxiety disorder: Secondary | ICD-10-CM | POA: Diagnosis not present

## 2024-04-18 DIAGNOSIS — Z21 Asymptomatic human immunodeficiency virus [HIV] infection status: Secondary | ICD-10-CM | POA: Diagnosis not present

## 2024-04-18 DIAGNOSIS — Z5321 Procedure and treatment not carried out due to patient leaving prior to being seen by health care provider: Secondary | ICD-10-CM | POA: Diagnosis not present

## 2024-04-18 DIAGNOSIS — M791 Myalgia, unspecified site: Secondary | ICD-10-CM | POA: Diagnosis present

## 2024-04-18 DIAGNOSIS — Z86711 Personal history of pulmonary embolism: Secondary | ICD-10-CM | POA: Diagnosis not present

## 2024-04-18 DIAGNOSIS — J9611 Chronic respiratory failure with hypoxia: Secondary | ICD-10-CM | POA: Diagnosis not present

## 2024-04-18 DIAGNOSIS — Z832 Family history of diseases of the blood and blood-forming organs and certain disorders involving the immune mechanism: Secondary | ICD-10-CM | POA: Diagnosis not present

## 2024-04-18 DIAGNOSIS — Z7901 Long term (current) use of anticoagulants: Secondary | ICD-10-CM | POA: Diagnosis not present

## 2024-04-18 DIAGNOSIS — D5700 Hb-SS disease with crisis, unspecified: Secondary | ICD-10-CM | POA: Diagnosis present

## 2024-04-18 DIAGNOSIS — G8929 Other chronic pain: Secondary | ICD-10-CM | POA: Diagnosis not present

## 2024-04-18 DIAGNOSIS — Z9981 Dependence on supplemental oxygen: Secondary | ICD-10-CM | POA: Diagnosis not present

## 2024-04-18 LAB — CBC WITH DIFFERENTIAL/PLATELET
Abs Immature Granulocytes: 0.06 K/uL (ref 0.00–0.07)
Basophils Absolute: 0 K/uL (ref 0.0–0.1)
Basophils Relative: 0 %
Eosinophils Absolute: 0.2 K/uL (ref 0.0–0.5)
Eosinophils Relative: 2 %
HCT: 27.5 % — ABNORMAL LOW (ref 39.0–52.0)
Hemoglobin: 9.7 g/dL — ABNORMAL LOW (ref 13.0–17.0)
Immature Granulocytes: 1 %
Lymphocytes Relative: 42 %
Lymphs Abs: 3.6 K/uL (ref 0.7–4.0)
MCH: 33.8 pg (ref 26.0–34.0)
MCHC: 35.3 g/dL (ref 30.0–36.0)
MCV: 95.8 fL (ref 80.0–100.0)
Monocytes Absolute: 0.9 K/uL (ref 0.1–1.0)
Monocytes Relative: 11 %
Neutro Abs: 3.7 K/uL (ref 1.7–7.7)
Neutrophils Relative %: 44 %
Platelets: 343 K/uL (ref 150–400)
RBC: 2.87 MIL/uL — ABNORMAL LOW (ref 4.22–5.81)
RDW: 27.3 % — ABNORMAL HIGH (ref 11.5–15.5)
WBC: 8.4 K/uL (ref 4.0–10.5)
nRBC: 28.8 % — ABNORMAL HIGH (ref 0.0–0.2)

## 2024-04-18 LAB — COMPREHENSIVE METABOLIC PANEL WITH GFR
ALT: 31 U/L (ref 0–44)
AST: 57 U/L — ABNORMAL HIGH (ref 15–41)
Albumin: 4.2 g/dL (ref 3.5–5.0)
Alkaline Phosphatase: 83 U/L (ref 38–126)
Anion gap: 10 (ref 5–15)
BUN: 7 mg/dL (ref 6–20)
CO2: 23 mmol/L (ref 22–32)
Calcium: 9.1 mg/dL (ref 8.9–10.3)
Chloride: 107 mmol/L (ref 98–111)
Creatinine, Ser: 0.76 mg/dL (ref 0.61–1.24)
GFR, Estimated: >60 mL/min
Glucose, Bld: 95 mg/dL (ref 70–99)
Potassium: 3.9 mmol/L (ref 3.5–5.1)
Sodium: 140 mmol/L (ref 135–145)
Total Bilirubin: 10.2 mg/dL — ABNORMAL HIGH (ref 0.0–1.2)
Total Protein: 8.5 g/dL — ABNORMAL HIGH (ref 6.5–8.1)

## 2024-04-18 LAB — RETICULOCYTES
Immature Retic Fract: 29 % — ABNORMAL HIGH (ref 2.3–15.9)
RBC.: 2.75 MIL/uL — ABNORMAL LOW (ref 4.22–5.81)
Retic Count, Absolute: 525.5 K/uL — ABNORMAL HIGH (ref 19.0–186.0)
Retic Ct Pct: 19.1 % — ABNORMAL HIGH (ref 0.4–3.1)

## 2024-04-18 MED ORDER — SENNOSIDES-DOCUSATE SODIUM 8.6-50 MG PO TABS: 1.0000 | ORAL_TABLET | Freq: Two times a day (BID) | ORAL | Status: DC

## 2024-04-18 MED ORDER — HEPARIN SOD (PORK) LOCK FLUSH 100 UNIT/ML IV SOLN
500.0000 [IU] | INTRAVENOUS | Status: AC | PRN
  Administered 2024-04-18: 500 [IU]

## 2024-04-18 MED ORDER — ONDANSETRON HCL 4 MG/2ML IJ SOLN: 4.0000 mg | Freq: Four times a day (QID) | INTRAMUSCULAR | Status: DC | PRN

## 2024-04-18 MED ORDER — ACETAMINOPHEN 500 MG PO TABS
1000.0000 mg | ORAL_TABLET | Freq: Once | ORAL | Status: DC
  Filled 2024-04-18: qty 2

## 2024-04-18 MED ORDER — NALOXONE HCL 0.4 MG/ML IJ SOLN: 0.4000 mg | INTRAMUSCULAR | Status: DC | PRN

## 2024-04-18 MED ORDER — POLYETHYLENE GLYCOL 3350 17 G PO PACK: 17.0000 g | PACK | Freq: Every day | ORAL | Status: DC | PRN

## 2024-04-18 MED ORDER — SODIUM CHLORIDE 0.9% FLUSH
10.0000 mL | INTRAVENOUS | Status: AC | PRN
  Administered 2024-04-18: 10 mL

## 2024-04-18 MED ORDER — DIPHENHYDRAMINE HCL 25 MG PO CAPS
25.0000 mg | ORAL_CAPSULE | ORAL | Status: DC | PRN
  Filled 2024-04-18: qty 1

## 2024-04-18 MED ORDER — HYDROMORPHONE 1 MG/ML IV SOLN
INTRAVENOUS | Status: DC
  Administered 2024-04-18: 13 mg via INTRAVENOUS
  Administered 2024-04-18: 30 mg via INTRAVENOUS
  Filled 2024-04-18: qty 30

## 2024-04-18 MED ORDER — SODIUM CHLORIDE 0.9% FLUSH: 9.0000 mL | INTRAVENOUS | Status: DC | PRN

## 2024-04-18 MED ORDER — SODIUM CHLORIDE 0.45 % IV SOLN: INTRAVENOUS | Status: DC

## 2024-04-18 NOTE — ED Notes (Signed)
 Patient states he wants labs drawn from his port triage RN is aware

## 2024-04-18 NOTE — ED Triage Notes (Signed)
 Pt here for sickle cell crisis. Pt reports that all over body pain has been bad x 1 day

## 2024-04-18 NOTE — Progress Notes (Signed)
 Patient admitted to the day hospital for sickle cell pain. Initially, patient reported generalized pain rated 10/10. For pain management, patient placed on Sickle Cell Dose Dilaudid  PCA and hydrated with IV fluids. At discharge, patient rated pain at 4/10. Vital signs stable. AVS offered but patient declined. Patient alert, oriented and ambulatory at discharge.

## 2024-04-18 NOTE — Discharge Summary (Cosign Needed Addendum)
 Physician Discharge Summary  Martin [name] Stephanie FMW:[id number] DOB: 12-03-1990 DOA: 04/18/2024  PCP: [name], [name], [name]  Admit date: 04/18/2024  Discharge date: 04/18/2024  Time spent: 30 minutes  Discharge Diagnoses:  Active Problems:   Sickle cell anemia with pain (HCC)   Discharge Condition: Stable  Diet recommendation: Regular  History of present illness:   Martin [name] is a 33 y.o. 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  Last took his home pain medication this morning with no resolution to pain symptoms.   Hospital Course:  Shmuel Tanis Burnley was admitted to the day hospital with sickle cell painful crisis. Patient was treated with IV fluid, weight based IV Dilaudid  PCA, clinician assisted doses as deemed appropriate, and other adjunct therapies per sickle cell pain management protocol. Mahir showed improvement symptomatically, pain improved from 10/10 to 4/10 at the time of discharge. Patient was discharged home in a hemodynamically stable condition. Martin [name] will follow-up at the clinic as previously scheduled, continue with home medications as per prior to admission.  Discharge Instructions We discussed the need for good hydration, monitoring of hydration status, avoidance of heat, cold, stress, and infection triggers. We discussed the need to be compliant with taking his home medications. Hamish was reminded of the need to seek medical attention immediately if any symptom of bleeding, anemia, or infection occurs.  Discharge Exam: Vitals:   04/18/24 1017 04/18/24  1110  BP:  104/67  Pulse:  93  Resp: 20 16  Temp:  99.5 F (37.5 C)  SpO2:  (!) 86%    General appearance: alert, cooperative and no distress Eyes: conjunctivae/corneas clear. PERRL, EOM's intact. Fundi benign. Neck: no adenopathy, no carotid bruit, no JVD, supple, symmetrical, trachea midline and thyroid not enlarged, symmetric, no tenderness/mass/nodules Back: symmetric, no curvature. ROM normal. No CVA tenderness. Resp: clear to auscultation bilaterally Chest wall: no tenderness Cardio: regular rate and rhythm, S1, S2 normal, no murmur, click, rub or gallop GI: soft, non-tender; bowel sounds normal; no masses,  no organomegaly Extremities: extremities normal, atraumatic, no cyanosis or edema Pulses: 2+ and symmetric Skin: Skin color, texture, turgor normal. No rashes or lesions Neurologic: Grossly normal  Discharge Instructions     Call MD for:  persistant nausea and vomiting   Complete by: As directed    Call MD for:  severe uncontrolled pain   Complete by: As directed    Call MD for:  temperature >100.4   Complete by: As directed    Increase activity slowly   Complete by: As directed       Allergies as of 04/18/2024       Reactions   Ketorolac  Tromethamine  Swelling, Other (See Comments)   Patient reports facial edema and left arm edema after administration.   Tape Rash, Other (See Comments)   PLEASE DO NOT USE THE CLEAR, THICK, PLASTIC TAPE- only paper tape is tolerated    Wound Dressing Adhesive Rash   Bactoshield Chg [chlorhexidine  Gluconate] Other (See Comments)   Per patient it makes me breakout        Medication List     TAKE these medications    albuterol  108 (90 Base)  MCG/ACT inhaler Commonly known as: VENTOLIN  HFA Inhale 2 puffs into the lungs every 6 (six) hours as needed for wheezing or shortness of breath.   amoxicillin -clavulanate 875-125 MG tablet Commonly known as: AUGMENTIN  Take 1 tablet by mouth 2 (two) times daily.    cyclobenzaprine  5 MG tablet Commonly known as: FLEXERIL  Take 1 tablet (5 mg total) by mouth 2 (two) times daily as needed for muscle spasms.   Deferasirox  360 MG Tabs Commonly known as: Jadenu  Take 2 tablets (720 mg total) by mouth daily.   diphenhydrAMINE  25 MG tablet Commonly known as: BENADRYL  Take 1 tablet (25 mg total) by mouth every 4 (four) hours as needed for itching. What changed: when to take this   Eliquis  5 MG Tabs tablet Generic drug: apixaban  Take 5 mg by mouth 2 (two) times daily.   folic acid  400 MCG tablet Commonly known as: FOLVITE  Take 1 tablet (400 mcg total) by mouth daily.   gabapentin  300 MG capsule Commonly known as: NEURONTIN  Take 1 capsule (300 mg total) by mouth at bedtime as needed (nerve pain).   guaiFENesin  600 MG 12 hr tablet Commonly known as: Mucinex  Take 1 tablet (600 mg total) by mouth 2 (two) times daily. What changed:  when to take this reasons to take this   guaiFENesin -dextromethorphan  100-10 MG/5ML syrup Commonly known as: ROBITUSSIN DM Take 10 mLs by mouth every 4 (four) hours as needed for cough.   hydroxyurea  500 MG capsule Commonly known as: HYDREA  Take 4 capsules (2,000 mg total) by mouth at bedtime.   Narcan  4 MG/0.1ML Liqd nasal spray kit Generic drug: naloxone  Place 1 spray into the nose as needed (respiratory distress / opioid overdose).   oxyCODONE -acetaminophen  10-325 MG tablet Commonly known as: PERCOCET Take 1 tablet by mouth every 4 (four) hours as needed for pain.   pantoprazole  40 MG tablet Commonly known as: PROTONIX  Take 1 tablet (40 mg total) by mouth daily as needed (acid reflux).   senna-docusate 8.6-50 MG tablet Commonly known as: Senokot-S Take 2 tablets by mouth at bedtime.   Triumeq  600-50-300 MG tablet Generic drug: abacavir -dolutegravir -lamiVUDine  Take 1 tablet by mouth daily. What changed: when to take this   Vitamin D3 125 MCG (5000 UT) Caps Take 5,000 Units by mouth every Tuesday.        Allergies[1]   Significant Diagnostic Studies: No results found.  Signed:  [name] Cover NP   04/18/2024, 2:47 PM     [1]  Allergies Allergen Reactions   Ketorolac  Tromethamine  Swelling and Other (See Comments)    Patient reports facial edema and left arm edema after administration.   Tape Rash and Other (See Comments)    PLEASE DO NOT USE THE CLEAR, THICK, PLASTIC TAPE- only paper tape is tolerated    Wound Dressing Adhesive Rash   Bactoshield Chg [Chlorhexidine  Gluconate] Other (See Comments)    Per patient it makes me breakout

## 2024-04-18 NOTE — Telephone Encounter (Signed)
 Patient called in. Complains of pain all over rates 10/10. Denied chest pain, abd pain, fever, N/V/D. Wants to come in for treatment. Last took Percocet 10 mg  at 230 am . Spoke with provider, patient can come to day hospital for treatment, he verbalizes understanding

## 2024-04-18 NOTE — ED Notes (Signed)
 Pt has a port and wishes to wait for a room to have labs drawn

## 2024-04-18 NOTE — H&P (Signed)
 [facility] History and Physical  Pio Jameek Bruntz FMW:[id number] DOB: Dec 23, 1990 DOA: 04/18/2024  PCP: [name], [name], [name]   Chief Complaint:  No chief complaint on file.   HPI: Martin [name] is a 33 y.o. 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  Last took his home pain medication this morning with no resolution to pain symptoms.   Systemic Review: General: The patient denies anorexia, fever, weight loss Cardiac: Denies chest pain, syncope, palpitations, pedal edema  Respiratory: Denies cough, shortness of breath, wheezing GI: Denies severe indigestion/heartburn, abdominal pain, nausea, vomiting, diarrhea and constipation GU: Denies hematuria, incontinence, dysuria  Musculoskeletal: Denies arthritis  Skin: Denies suspicious skin lesions Neurologic: Denies focal weakness or numbness, change in vision  Past Medical History:  Diagnosis Date   Anxiety    Chest pain 02/04/2024   HIV (human immunodeficiency virus infection) (HCC)    Proteinuria    Sickle cell disease (HCC)    Vitamin D  deficiency 10/2018    Past Surgical History:  Procedure Laterality Date   IR IMAGING GUIDED PORT INSERTION  08/29/2019   IR REMOVAL TUN ACCESS W/ PORT W/O FL MOD SED  08/30/2020   TEE WITHOUT CARDIOVERSION N/A 08/30/2020   Procedure: TRANSESOPHAGEAL ECHOCARDIOGRAM (TEE);  Surgeon: Mona Vinie BROCKS, MD;  Location: [facility] ENDOSCOPY;  Service: Cardiovascular;  Laterality: N/A;    Allergies  Allergen Reactions   Ketorolac  Tromethamine  Swelling and Other (See Comments)    Patient reports facial  edema and left arm edema after administration.   Tape Rash and Other (See Comments)    PLEASE DO NOT USE THE CLEAR, THICK, PLASTIC TAPE- only paper tape is tolerated    Wound Dressing Adhesive Rash   Bactoshield Chg [Chlorhexidine  Gluconate] Other (See Comments)    Per patient it makes me breakout    Family History  Problem Relation Age of Onset   Sickle cell trait Mother    Sickle cell trait Father    Birth defects Maternal Grandmother    Birth defects Paternal Grandmother       Prior to Admission medications   Medication Sig Start Date End Date Taking? Authorizing Provider  abacavir -dolutegravir -lamiVUDine  (TRIUMEQ ) 600-50-300 MG tablet Take 1 tablet by mouth daily. 08/13/23  Yes Emokpae, Courage, MD  apixaban  (ELIQUIS ) 5 MG TABS tablet Take 1 tablet (5 mg total) by mouth 2 (two) times daily. 08/13/23  Yes Emokpae, Courage, MD  cyclobenzaprine  (FLEXERIL ) 10 MG tablet Take 10 mg by mouth 2 (two) times daily as needed for muscle spasms. 12/22/22  Yes [provider]  diphenhydrAMINE  (BENADRYL ) 25 MG tablet Take 1 tablet (25 mg total) by mouth every 4 (four) hours as needed for itching. 08/13/23  Yes Emokpae, Courage, MD  folic acid  (FOLVITE ) 400 MCG tablet Take 1 tablet (400 mcg total) by mouth daily. 08/13/23  Yes Emokpae, Courage, MD  gabapentin  (NEURONTIN ) 300 MG capsule Take 1 capsule (300 mg total) by mouth at bedtime as needed (nerve pain). 08/13/23  Yes Emokpae, Courage, MD  hydroxyurea  (HYDREA ) 500 MG capsule Take 4 capsules (2,000 mg total) by mouth at bedtime. 08/13/23  Yes Pearlean Manus, MD  oxyCODONE -acetaminophen  (PERCOCET) 10-325 MG tablet  Take 1 tablet by mouth every 4 (four) hours as needed for pain. 12/03/23  Yes [name], [name], [name]  Vitamin D , Ergocalciferol , (DRISDOL ) 1.25 MG (50000 UNIT) CAPS capsule Take 1 capsule (50,000 Units total) by mouth every 7 (seven) days. 11/05/23  Yes [name], [name], [name]  albuterol  (VENTOLIN  HFA) 108 (90 Base) MCG/ACT  inhaler Inhale 2 puffs into the lungs every 6 (six) hours as needed for wheezing or shortness of breath. Patient not taking: Reported on 10/20/2023 08/13/23   Pearlean Manus, MD  Deferasirox  (JADENU ) 360 MG TABS Take 2 tablets (720 mg total) by mouth daily. 11/05/23   [name], [name], [name]  guaiFENesin  (MUCINEX ) 600 MG 12 hr tablet Take 1 tablet (600 mg total) by mouth 2 (two) times daily. Patient not taking: Reported on 10/20/2023 08/13/23 08/12/24  Pearlean Manus, MD  NARCAN  4 MG/0.1ML LIQD nasal spray kit Place 1 spray into the nose as needed (respiratory distress / opioid overdose). 01/07/23   [provider]  pantoprazole  (PROTONIX ) 40 MG tablet Take 1 tablet (40 mg total) by mouth daily as needed (acid reflux). Patient not taking: Reported on 10/20/2023 08/13/23   Pearlean Manus, MD  senna-docusate (SENOKOT-S) 8.6-50 MG tablet Take 2 tablets by mouth at bedtime. Patient not taking: Reported on 10/20/2023 08/13/23   Pearlean Manus, MD  triamcinolone  cream (KENALOG ) 0.1 % Apply 1 Application topically 2 (two) times daily. Patient not taking: Reported on 10/27/2023 01/12/23   Tilford Bertram HERO, [name]  Vitamin D , Ergocalciferol , (DRISDOL ) 1.25 MG (50000 UNIT) CAPS capsule Take 1 capsule (50,000 Units total) by mouth every 7 (seven) days. 11/09/23   [name], [name], [name]     Physical Exam: Vitals:   04/18/24 1017 04/18/24 1110  BP:  104/67  Pulse:  93  Resp: 20 16  Temp:  99.5 F (37.5 C)  TempSrc:  Temporal  SpO2:  (!) 86%     General: Alert, awake, afebrile, anicteric, not in obvious distress HEENT: Normocephalic and Atraumatic, Mucous membranes pink                PERRLA; EOM intact; No scleral icterus,                 Nares: Patent, Oropharynx: Clear, Fair Dentition                 Neck: FROM, no cervical lymphadenopathy, thyromegaly, carotid bruit or JVD;  CHEST WALL: No tenderness  CHEST: Normal respiration, clear to auscultation bilaterally  HEART: Regular rate and  rhythm; no murmurs rubs or gallops  BACK: No kyphosis or scoliosis; no CVA tenderness  ABDOMEN: Positive Bowel Sounds, soft, non-tender; no masses, no organomegaly EXTREMITIES: No cyanosis, clubbing, or edema SKIN:  no rash or ulceration  CNS: Alert and Oriented x 4, Nonfocal exam, CN 2-12 intact  Labs on Admission:  Basic Metabolic Panel: Recent Labs  Lab 04/15/24 0559 04/18/24 0051  NA 142 140  K 3.8 3.9  CL 108 107  CO2 25 23  GLUCOSE 87 95  BUN 9 7  CREATININE 0.80 0.76  CALCIUM  8.9 9.1   Liver Function Tests: Recent Labs  Lab 04/18/24 0051  AST 57*  ALT 31  ALKPHOS 83  BILITOT 10.2*  PROT 8.5*  ALBUMIN 4.2    No results for input(s): LIPASE, AMYLASE in the last 168 hours. No results for input(s): AMMONIA in the last 168 hours. CBC: Recent Labs  Lab 04/15/24 0559 04/18/24 0051  WBC 9.4 8.4  NEUTROABS 5.3  3.7  HGB 8.2* 9.7*  HCT 23.4* 27.5*  MCV 97.5 95.8  PLT 341 343   Cardiac Enzymes: No results for input(s): CKTOTAL, CKMB, CKMBINDEX, TROPONINI in the last 168 hours.  BNP (last 3 results) No results for input(s): BNP in the last 8760 hours.  ProBNP (last 3 results) Recent Labs    01/16/24 1816  PROBNP 2,143.0*    CBG: No results for input(s): GLUCAP in the last 168 hours.   Assessment/Plan Active Problems:   Sickle cell anemia with pain (HCC)  Admits to the Day Hospital for extended observation IVF 0 .45% Saline @ 100 mls/hour Weight based Dilaudid  PCA started within 30 minutes of admission Acetaminophen  1000 mg x 1 dose Labs: CBCD, CMP, Retic Count and LDH (completed in the ED today)  Monitor vitals very closely, Re-evaluate pain scale every hour 2 L of Oxygen  by [place] Patient will be re-evaluated for pain in the context of function and relationship to baseline as care progresses. If no significant relieve from pain (remains above 5/10) will transfer patient to inpatient services for further evaluation and  management  Code Status: Full  Family Communication: None  DVT Prophylaxis: Ambulate as tolerated   Time spent: 35 Minutes  [name] Cover NP   If 7PM-7AM, please contact night-coverage [email or web address] 04/18/2024, 2:14 PM

## 2024-04-20 ENCOUNTER — Inpatient Hospital Stay (HOSPITAL_COMMUNITY)
Admission: EM | Admit: 2024-04-20 | Discharge: 2024-05-01 | DRG: 811 | Disposition: A | Discharge status: Home / Self Care | Payer: Self-pay | Attending: Internal Medicine | Admitting: Internal Medicine

## 2024-04-20 ENCOUNTER — Other Ambulatory Visit: Payer: Self-pay

## 2024-04-20 ENCOUNTER — Encounter (HOSPITAL_COMMUNITY): Payer: Self-pay | Admitting: Internal Medicine

## 2024-04-20 ENCOUNTER — Emergency Department (HOSPITAL_COMMUNITY): Payer: Self-pay

## 2024-04-20 DIAGNOSIS — D5700 Hb-SS disease with crisis, unspecified: Principal | ICD-10-CM | POA: Diagnosis present

## 2024-04-20 DIAGNOSIS — Z86711 Personal history of pulmonary embolism: Secondary | ICD-10-CM

## 2024-04-20 DIAGNOSIS — Z21 Asymptomatic human immunodeficiency virus [HIV] infection status: Secondary | ICD-10-CM | POA: Diagnosis present

## 2024-04-20 DIAGNOSIS — Z7901 Long term (current) use of anticoagulants: Secondary | ICD-10-CM

## 2024-04-20 DIAGNOSIS — J9621 Acute and chronic respiratory failure with hypoxia: Secondary | ICD-10-CM | POA: Diagnosis present

## 2024-04-20 DIAGNOSIS — J9611 Chronic respiratory failure with hypoxia: Secondary | ICD-10-CM | POA: Diagnosis present

## 2024-04-20 DIAGNOSIS — Z886 Allergy status to analgesic agent status: Secondary | ICD-10-CM

## 2024-04-20 DIAGNOSIS — N179 Acute kidney failure, unspecified: Secondary | ICD-10-CM | POA: Diagnosis present

## 2024-04-20 DIAGNOSIS — Z79899 Other long term (current) drug therapy: Secondary | ICD-10-CM

## 2024-04-20 DIAGNOSIS — Z888 Allergy status to other drugs, medicaments and biological substances status: Secondary | ICD-10-CM

## 2024-04-20 DIAGNOSIS — B20 Human immunodeficiency virus [HIV] disease: Secondary | ICD-10-CM | POA: Diagnosis present

## 2024-04-20 DIAGNOSIS — Z9981 Dependence on supplemental oxygen: Secondary | ICD-10-CM

## 2024-04-20 DIAGNOSIS — D638 Anemia in other chronic diseases classified elsewhere: Secondary | ICD-10-CM | POA: Diagnosis present

## 2024-04-20 DIAGNOSIS — F411 Generalized anxiety disorder: Secondary | ICD-10-CM | POA: Diagnosis present

## 2024-04-20 DIAGNOSIS — J189 Pneumonia, unspecified organism: Secondary | ICD-10-CM | POA: Diagnosis present

## 2024-04-20 DIAGNOSIS — F32A Depression, unspecified: Secondary | ICD-10-CM | POA: Diagnosis present

## 2024-04-20 DIAGNOSIS — K219 Gastro-esophageal reflux disease without esophagitis: Secondary | ICD-10-CM | POA: Diagnosis present

## 2024-04-20 DIAGNOSIS — D5701 Hb-SS disease with acute chest syndrome: Secondary | ICD-10-CM

## 2024-04-20 DIAGNOSIS — Z832 Family history of diseases of the blood and blood-forming organs and certain disorders involving the immune mechanism: Secondary | ICD-10-CM

## 2024-04-20 DIAGNOSIS — G894 Chronic pain syndrome: Secondary | ICD-10-CM | POA: Diagnosis present

## 2024-04-20 DIAGNOSIS — R011 Cardiac murmur, unspecified: Secondary | ICD-10-CM | POA: Diagnosis present

## 2024-04-20 LAB — CBC WITH DIFFERENTIAL/PLATELET
Abs Immature Granulocytes: 0.05 K/uL (ref 0.00–0.07)
Basophils Absolute: 0 K/uL (ref 0.0–0.1)
Basophils Relative: 0 %
Eosinophils Absolute: 0.1 K/uL (ref 0.0–0.5)
Eosinophils Relative: 1 %
HCT: 17.5 % — ABNORMAL LOW (ref 39.0–52.0)
Hemoglobin: 9 g/dL — ABNORMAL LOW (ref 13.0–17.0)
Immature Granulocytes: 1 %
Lymphocytes Relative: 34 %
Lymphs Abs: 3.2 K/uL (ref 0.7–4.0)
MCH: 50.3 pg — ABNORMAL HIGH (ref 26.0–34.0)
MCHC: 51.4 g/dL — ABNORMAL HIGH (ref 30.0–36.0)
MCV: 97.8 fL (ref 80.0–100.0)
Monocytes Absolute: 1 K/uL (ref 0.1–1.0)
Monocytes Relative: 10 %
Neutro Abs: 5.2 K/uL (ref 1.7–7.7)
Neutrophils Relative %: 54 %
Platelets: 282 K/uL (ref 150–400)
RBC: 1.79 MIL/uL — ABNORMAL LOW (ref 4.22–5.81)
RDW: 29.2 % — ABNORMAL HIGH (ref 11.5–15.5)
Smear Review: NORMAL
WBC: 9.5 K/uL (ref 4.0–10.5)
nRBC: 50 % — ABNORMAL HIGH (ref 0.0–0.2)

## 2024-04-20 LAB — COMPREHENSIVE METABOLIC PANEL WITH GFR
ALT: 36 U/L (ref 0–44)
AST: 62 U/L — ABNORMAL HIGH (ref 15–41)
Albumin: 3.9 g/dL (ref 3.5–5.0)
Alkaline Phosphatase: 77 U/L (ref 38–126)
Anion gap: 8 (ref 5–15)
BUN: 6 mg/dL (ref 6–20)
CO2: 25 mmol/L (ref 22–32)
Calcium: 8.7 mg/dL — ABNORMAL LOW (ref 8.9–10.3)
Chloride: 110 mmol/L (ref 98–111)
Creatinine, Ser: 0.85 mg/dL (ref 0.61–1.24)
GFR, Estimated: >60 mL/min
Glucose, Bld: 82 mg/dL (ref 70–99)
Potassium: 3.7 mmol/L (ref 3.5–5.1)
Sodium: 142 mmol/L (ref 135–145)
Total Bilirubin: 11 mg/dL — ABNORMAL HIGH (ref 0.0–1.2)
Total Protein: 7.8 g/dL (ref 6.5–8.1)

## 2024-04-20 LAB — RETICULOCYTES
Immature Retic Fract: 30.2 % — ABNORMAL HIGH (ref 2.3–15.9)
RBC.: 2.5 MIL/uL — ABNORMAL LOW (ref 4.22–5.81)
Retic Count, Absolute: 529.5 K/uL — ABNORMAL HIGH (ref 19.0–186.0)
Retic Ct Pct: 21.2 % — ABNORMAL HIGH (ref 0.4–3.1)

## 2024-04-20 LAB — TROPONIN T, HIGH SENSITIVITY: Troponin T High Sensitivity: <15 ng/L (ref 0–19)

## 2024-04-20 LAB — LIPASE, BLOOD: Lipase: 13 U/L (ref 11–51)

## 2024-04-20 MED ORDER — ONDANSETRON HCL 4 MG/2ML IJ SOLN
4.0000 mg | Freq: Four times a day (QID) | INTRAMUSCULAR | Status: DC | PRN
  Administered 2024-04-21 – 2024-04-28 (×9): 4 mg via INTRAVENOUS
  Filled 2024-04-20 (×9): qty 2

## 2024-04-20 MED ORDER — ALBUTEROL SULFATE HFA 108 (90 BASE) MCG/ACT IN AERS: 2.0000 | INHALATION_SPRAY | Freq: Four times a day (QID) | RESPIRATORY_TRACT | Status: DC | PRN

## 2024-04-20 MED ORDER — SENNOSIDES-DOCUSATE SODIUM 8.6-50 MG PO TABS
1.0000 | ORAL_TABLET | Freq: Two times a day (BID) | ORAL | Status: DC
  Administered 2024-04-21 – 2024-05-01 (×11): 1 via ORAL
  Filled 2024-04-20 (×17): qty 1

## 2024-04-20 MED ORDER — SODIUM CHLORIDE 0.9 % IV SOLN
500.0000 mg | Freq: Once | INTRAVENOUS | Status: AC
  Administered 2024-04-20: 500 mg via INTRAVENOUS
  Filled 2024-04-20: qty 5

## 2024-04-20 MED ORDER — ALBUTEROL SULFATE (2.5 MG/3ML) 0.083% IN NEBU
2.5000 mg | INHALATION_SOLUTION | Freq: Four times a day (QID) | RESPIRATORY_TRACT | Status: DC | PRN
  Administered 2024-04-23: 2.5 mg via RESPIRATORY_TRACT
  Filled 2024-04-20 (×2): qty 3

## 2024-04-20 MED ORDER — HYDROMORPHONE HCL 1 MG/ML IJ SOLN
2.0000 mg | INTRAMUSCULAR | Status: DC | PRN
  Administered 2024-04-21: 2 mg via INTRAVENOUS
  Filled 2024-04-20: qty 2

## 2024-04-20 MED ORDER — OXYCODONE HCL 5 MG PO TABS
5.0000 mg | ORAL_TABLET | ORAL | Status: DC | PRN
  Administered 2024-04-21 – 2024-05-01 (×29): 5 mg via ORAL
  Filled 2024-04-20 (×29): qty 1

## 2024-04-20 MED ORDER — POLYETHYLENE GLYCOL 3350 17 G PO PACK: 17.0000 g | PACK | Freq: Every day | ORAL | Status: DC | PRN

## 2024-04-20 MED ORDER — HYDROMORPHONE HCL 1 MG/ML IJ SOLN
2.0000 mg | INTRAMUSCULAR | Status: AC
  Administered 2024-04-20: 2 mg via INTRAVENOUS
  Filled 2024-04-20: qty 2

## 2024-04-20 MED ORDER — OXYCODONE-ACETAMINOPHEN 10-325 MG PO TABS: 1.0000 | ORAL_TABLET | ORAL | Status: DC | PRN

## 2024-04-20 MED ORDER — DIPHENHYDRAMINE HCL 25 MG PO CAPS
25.0000 mg | ORAL_CAPSULE | ORAL | Status: DC | PRN
  Administered 2024-04-20: 25 mg via ORAL
  Filled 2024-04-20: qty 1

## 2024-04-20 MED ORDER — APIXABAN 5 MG PO TABS
5.0000 mg | ORAL_TABLET | Freq: Two times a day (BID) | ORAL | Status: DC
  Administered 2024-04-20 – 2024-05-01 (×22): 5 mg via ORAL
  Filled 2024-04-20 (×22): qty 1

## 2024-04-20 MED ORDER — SODIUM CHLORIDE 0.9 % IV SOLN
1.0000 g | Freq: Once | INTRAVENOUS | Status: AC
  Administered 2024-04-20: 1 g via INTRAVENOUS
  Filled 2024-04-20: qty 10

## 2024-04-20 MED ORDER — HYDROMORPHONE 1 MG/ML IV SOLN
INTRAVENOUS | Status: DC
  Administered 2024-04-20: 3.5 mg via INTRAVENOUS
  Administered 2024-04-21: 5.5 mg via INTRAVENOUS
  Administered 2024-04-21: 4 mg via INTRAVENOUS
  Administered 2024-04-21: 30 mg via INTRAVENOUS
  Administered 2024-04-21: 4.5 mg via INTRAVENOUS
  Administered 2024-04-21: 5 mg via INTRAVENOUS
  Administered 2024-04-22: 5.5 mg via INTRAVENOUS
  Administered 2024-04-22: 5 mg via INTRAVENOUS
  Administered 2024-04-22: 2.5 mg via INTRAVENOUS
  Administered 2024-04-22: 30 mg via INTRAVENOUS
  Administered 2024-04-23: 6.5 mg via INTRAVENOUS
  Administered 2024-04-23: 3.5 mg via INTRAVENOUS
  Administered 2024-04-23: 2.5 mg via INTRAVENOUS
  Administered 2024-04-23: 4.5 mg via INTRAVENOUS
  Administered 2024-04-23 – 2024-04-24 (×3): 4 mg via INTRAVENOUS
  Administered 2024-04-24: 0.5 mg via INTRAVENOUS
  Administered 2024-04-24: 1.5 mg via INTRAVENOUS
  Administered 2024-04-24: 3.5 mg via INTRAVENOUS
  Administered 2024-04-24: 30 mg via INTRAVENOUS
  Administered 2024-04-24: 0.5 mg via INTRAVENOUS
  Administered 2024-04-25: 2 mg via INTRAVENOUS
  Administered 2024-04-25: 3.5 mg via INTRAVENOUS
  Administered 2024-04-25: 5.5 mg via INTRAVENOUS
  Administered 2024-04-25: 30 mg via INTRAVENOUS
  Administered 2024-04-25: 5 mg via INTRAVENOUS
  Administered 2024-04-26: 3 mg via INTRAVENOUS
  Administered 2024-04-26: 5 mg via INTRAVENOUS
  Administered 2024-04-26: 6 mg via INTRAVENOUS
  Administered 2024-04-26: 4 mg via INTRAVENOUS
  Administered 2024-04-26: 0.5 mg via INTRAVENOUS
  Administered 2024-04-26: 0.1 mg via INTRAVENOUS
  Administered 2024-04-26: 5.5 mg via INTRAVENOUS
  Administered 2024-04-26: 3.5 mg via INTRAVENOUS
  Administered 2024-04-26: 30 mg via INTRAVENOUS
  Administered 2024-04-27: 8 mg via INTRAVENOUS
  Administered 2024-04-27: 7.5 mg via INTRAVENOUS
  Administered 2024-04-27: 3 mg via INTRAVENOUS
  Administered 2024-04-27: 30 mg via INTRAVENOUS
  Administered 2024-04-27: 8.5 mg via INTRAVENOUS
  Administered 2024-04-27: 5.5 mg via INTRAVENOUS
  Administered 2024-04-28: 4 mg via INTRAVENOUS
  Administered 2024-04-28: 5.5 mg via INTRAVENOUS
  Filled 2024-04-20 (×7): qty 30

## 2024-04-20 MED ORDER — ABACAVIR-DOLUTEGRAVIR-LAMIVUD 600-50-300 MG PO TABS
1.0000 | ORAL_TABLET | Freq: Every day | ORAL | Status: DC
  Administered 2024-04-20 – 2024-04-30 (×11): 1 via ORAL
  Filled 2024-04-20 (×11): qty 1

## 2024-04-20 MED ORDER — FOLIC ACID 1 MG PO TABS
1.0000 mg | ORAL_TABLET | Freq: Every day | ORAL | Status: DC
  Administered 2024-04-21 – 2024-05-01 (×11): 1 mg via ORAL
  Filled 2024-04-20 (×11): qty 1

## 2024-04-20 MED ORDER — OXYCODONE-ACETAMINOPHEN 5-325 MG PO TABS
1.0000 | ORAL_TABLET | ORAL | Status: DC | PRN
  Administered 2024-04-21 – 2024-05-01 (×29): 1 via ORAL
  Filled 2024-04-20 (×29): qty 1

## 2024-04-20 MED ORDER — ABACAVIR-DOLUTEGRAVIR-LAMIVUD 600-50-300 MG PO TABS: 1.0000 | ORAL_TABLET | Freq: Every day | ORAL | Status: DC

## 2024-04-20 MED ORDER — NALOXONE HCL 0.4 MG/ML IJ SOLN: 0.4000 mg | INTRAMUSCULAR | Status: DC | PRN

## 2024-04-20 MED ORDER — CARMEX CLASSIC LIP BALM EX OINT
1.0000 | TOPICAL_OINTMENT | CUTANEOUS | Status: DC | PRN
  Administered 2024-04-26 – 2024-04-30 (×2): 1 via TOPICAL
  Filled 2024-04-20 (×4): qty 10

## 2024-04-20 MED ORDER — FOLIC ACID 400 MCG PO TABS: 400.0000 ug | ORAL_TABLET | Freq: Every day | ORAL | Status: DC

## 2024-04-20 MED ORDER — DEXTROSE-SODIUM CHLORIDE 5-0.45 % IV SOLN: INTRAVENOUS | Status: DC

## 2024-04-20 MED ORDER — SODIUM CHLORIDE 0.9% FLUSH: 9.0000 mL | INTRAVENOUS | Status: DC | PRN

## 2024-04-20 MED ORDER — DIPHENHYDRAMINE HCL 25 MG PO CAPS
25.0000 mg | ORAL_CAPSULE | ORAL | Status: DC | PRN
  Administered 2024-04-22 – 2024-04-23 (×2): 25 mg via ORAL
  Filled 2024-04-20 (×3): qty 1

## 2024-04-20 NOTE — ED Notes (Signed)
Unable to obtain blood cultures prior to starting antibiotics.

## 2024-04-20 NOTE — H&P (Addendum)
 H&P  Patient Demographics:  Martin [name], is a 34 y.o. male  MRN: [id number]   DOB - [birth year]  Admit Date - 04/20/2024  Outpatient Primary MD for the patient is [name], [name], [name]  Chief Complaint  Patient presents with   Sickle Cell Pain Crisis      HPI:   Martin [name]  is a 34 y.o. male  with history of sickle cell disease, history of acute chest syndrome, multiple sickle cell crisis, multiple ED visits and hospital admission, history of PE currently on Eliquis , chronic hypoxic respiratory failure on home oxygen , HIV infection and generalized anxiety disorder who presented to the emergency department with major complaints of generalized body pain, shortness of breath, and chest pain that is consistent with his typical sickle cell pain crisis.  He last took his home pain medication this morning with no resolution to pain symptoms.  Patient is afebrile, denies nausea, vomiting, diarrhea.  No headaches, dizziness or fainting.  Denies recent sick contacts, no urinary symptoms.   ED course: BP 109/86   Pulse (!) 101   Temp 98.4 F (36.9 C) (Oral)   Resp 20   Ht 6' 3 (1.905 m)   Wt 63.5 kg   SpO2 90%   BMI 17.50 kg/m reticulocyte count 5-9.5, RBC 2.50, hemoglobin 9.0, WBC 9.5.  Chest x-ray showed:Mild pulmonary edema with underlying chronic coarsening interstitial markings and patchy airspace opacities. Patient was treated with IV fluid, IV pain medication with no resolution to pain symptoms.  Patient was therefore admitted for sickle cell pain crisis.    Review of systems:  In addition to the HPI above, patient reports No fever or chills No Headache, No changes with vision or hearing No problems swallowing food or liquids chest pain, shortness of breath No abdominal pain, No nausea or vomiting, Bowel movements are regular No blood in stool or urine No dysuria No new skin rashes or bruises Lower back pains-aches No new weakness, tingling, numbness in any  extremity No recent weight gain or loss No polyuria, polydypsia or polyphagia No significant Mental Stressors  A full 10 point Review of Systems was done, except as stated above, all other Review of Systems were negative.  With Past History of the following :   Past Medical History:  Diagnosis Date   Anxiety    Chest pain 02/04/2024   HIV (human immunodeficiency virus infection) (HCC)    Proteinuria    Sickle cell disease (HCC)    Vitamin D  deficiency 10/2018      Past Surgical History:  Procedure Laterality Date   IR IMAGING GUIDED PORT INSERTION  08/29/2019   IR REMOVAL TUN ACCESS W/ PORT W/O FL MOD SED  08/30/2020   TEE WITHOUT CARDIOVERSION N/A 08/30/2020   Procedure: TRANSESOPHAGEAL ECHOCARDIOGRAM (TEE);  Surgeon: Mona Vinie BROCKS, MD;  Location: [facility] ENDOSCOPY;  Service: Cardiovascular;  Laterality: N/A;     Social History:   Social History   Tobacco Use   Smoking status: Never   Smokeless tobacco: Never  Substance Use Topics   Alcohol  use: No     Lives - At home   Family History :   Family History  Problem Relation Age of Onset   Sickle cell trait Mother    Sickle cell trait Father    Birth defects Maternal Grandmother    Birth defects Paternal Grandmother      Home Medications:   Prior to Admission medications  Medication Sig Start Date End Date Taking? Authorizing Provider  abacavir -dolutegravir -lamiVUDine  (TRIUMEQ ) 600-50-300 MG tablet Take 1 tablet by mouth daily. Patient taking differently: Take 1 tablet by mouth at bedtime. 08/13/23   Pearlean Manus, MD  albuterol  (VENTOLIN  HFA) 108 (90 Base) MCG/ACT inhaler Inhale 2 puffs into the lungs every 6 (six) hours as needed for wheezing or shortness of breath. 08/13/23   Pearlean Manus, MD  amoxicillin -clavulanate (AUGMENTIN ) 875-125 MG tablet Take 1 tablet by mouth 2 (two) times daily. 03/12/24   Jegede, Olugbemiga E, MD  apixaban  (ELIQUIS ) 5 MG TABS tablet Take 5 mg by mouth 2 (two) times daily.     [provider]  Cholecalciferol  (VITAMIN D3) 125 MCG (5000 UT) CAPS Take 5,000 Units by mouth every Tuesday.    [provider]  cyclobenzaprine  (FLEXERIL ) 5 MG tablet Take 1 tablet (5 mg total) by mouth 2 (two) times daily as needed for muscle spasms. 02/04/24   [name], [name], [name]  Deferasirox  (JADENU ) 360 MG TABS Take 2 tablets (720 mg total) by mouth daily. 11/05/23   [name], [name], [name]  diphenhydrAMINE  (BENADRYL ) 25 MG tablet Take 1 tablet (25 mg total) by mouth every 4 (four) hours as needed for itching. Patient taking differently: Take 25 mg by mouth daily as needed for itching. 08/13/23   Pearlean Manus, MD  folic acid  (FOLVITE ) 400 MCG tablet Take 1 tablet (400 mcg total) by mouth daily. 08/13/23   Pearlean Manus, MD  gabapentin  (NEURONTIN ) 300 MG capsule Take 1 capsule (300 mg total) by mouth at bedtime as needed (nerve pain). 08/13/23   Pearlean Manus, MD  guaiFENesin  (MUCINEX ) 600 MG 12 hr tablet Take 1 tablet (600 mg total) by mouth 2 (two) times daily. Patient taking differently: Take 600 mg by mouth 2 (two) times daily as needed for cough or to loosen phlegm. 08/13/23 08/12/24  Pearlean Manus, MD  guaiFENesin -dextromethorphan  (ROBITUSSIN DM) 100-10 MG/5ML syrup Take 10 mLs by mouth every 4 (four) hours as needed for cough. 03/12/24   Jegede, Olugbemiga E, MD  hydroxyurea  (HYDREA ) 500 MG capsule Take 4 capsules (2,000 mg total) by mouth at bedtime. 08/13/23   Pearlean Manus, MD  NARCAN  4 MG/0.1ML LIQD nasal spray kit Place 1 spray into the nose as needed (respiratory distress / opioid overdose). 01/07/23   [provider]  oxyCODONE -acetaminophen  (PERCOCET) 10-325 MG tablet Take 1 tablet by mouth every 4 (four) hours as needed for pain. 04/15/24   [name], [name], [name]  pantoprazole  (PROTONIX ) 40 MG tablet Take 1 tablet (40 mg total) by mouth daily as needed (acid reflux). 08/13/23   Pearlean Manus, MD  senna-docusate (SENOKOT-S) 8.6-50 MG  tablet Take 2 tablets by mouth at bedtime. 08/13/23   Pearlean Manus, MD     Allergies:   Allergies[1]   Physical Exam:   Vitals:   Vitals:   04/21/24 1509 04/21/24 1648  BP: 121/79   Pulse: (!) 113   Resp: 20 20  Temp:    SpO2: (!) 85% 90%    Physical Exam: Constitutional: Patient appears well-developed and well-nourished. Not in obvious distress. HENT: Normocephalic, atraumatic, External right and left ear normal. Oropharynx is clear and moist.  Eyes:  scleral icterus. Neck: Normal ROM. Neck supple. No JVD. No tracheal deviation. No thyromegaly. CVS: Murmur Pulmonary: Effort and breath sounds normal, no stridor, rhonchi, wheezes, rales.  Abdominal: Soft. BS +, no distension, tenderness, rebound or guarding.  Musculoskeletal: Generalized body tenderness lymphadenopathy: No lymphadenopathy noted, cervical, inguinal or axillary Neuro: Alert. Normal reflexes, muscle tone coordination. No cranial nerve  deficit. Skin: Skin is warm and dry. No rash noted. Not diaphoretic. No erythema. No pallor. Psychiatric: Normal mood and affect. Behavior, judgment, thought content normal.   Data Review:   CBC Recent Labs  Lab 04/15/24 0559 04/18/24 0051 04/20/24 1310 04/21/24 0527  WBC 9.4 8.4 9.5 15.0*  HGB 8.2* 9.7* 9.0* 8.4*  HCT 23.4* 27.5* 17.5* 23.1*  PLT 341 343 282 252  MCV 97.5 95.8 97.8 95.9  MCH 34.2* 33.8 50.3* 34.9*  MCHC 35.0 35.3 51.4* 36.4*  RDW 27.7* 27.3* 29.2* 26.1*  LYMPHSABS 2.7 3.6 3.2  --   MONOABS 1.2* 0.9 1.0  --   EOSABS 0.1 0.2 0.1  --   BASOSABS 0.0 0.0 0.0  --    ------------------------------------------------------------------------------------------------------------------  Chemistries  Recent Labs  Lab 04/15/24 0559 04/18/24 0051 04/20/24 1310  NA 142 140 142  K 3.8 3.9 3.7  CL 108 107 110  CO2 25 23 25   GLUCOSE 87 95 82  BUN 9 7 6   CREATININE 0.80 0.76 0.85  CALCIUM  8.9 9.1 8.7*  AST  --  57* 62*  ALT  --  31 36  ALKPHOS  --  83  77  BILITOT  --  10.2* 11.0*   ------------------------------------------------------------------------------------------------------------------ estimated creatinine clearance is 110 mL/min (by C-G formula based on SCr of 0.85 mg/dL). ------------------------------------------------------------------------------------------------------------------ No results for input(s): TSH, T4TOTAL, T3FREE, THYROIDAB in the last 72 hours.  Invalid input(s): FREET3  Coagulation profile No results for input(s): INR, PROTIME in the last 168 hours. ------------------------------------------------------------------------------------------------------------------- No results for input(s): DDIMER in the last 72 hours. -------------------------------------------------------------------------------------------------------------------  Cardiac Enzymes No results for input(s): CKMB, TROPONINI, MYOGLOBIN in the last 168 hours.  Invalid input(s): CK ------------------------------------------------------------------------------------------------------------------    Component Value Date/Time   BNP 91.4 05/27/2021 2216    ---------------------------------------------------------------------------------------------------------------  Urinalysis    Component Value Date/Time   COLORURINE YELLOW 04/10/2024 0527   APPEARANCEUR CLEAR 04/10/2024 0527   LABSPEC 1.010 04/10/2024 0527   PHURINE 6.0 04/10/2024 0527   GLUCOSEU NEGATIVE 04/10/2024 0527   HGBUR NEGATIVE 04/10/2024 0527   BILIRUBINUR NEGATIVE 04/10/2024 0527   BILIRUBINUR moderate (A) 02/04/2024 1613   BILIRUBINUR neg 11/27/2019 1149   KETONESUR NEGATIVE 04/10/2024 0527   PROTEINUR NEGATIVE 04/10/2024 0527   UROBILINOGEN 2.0 (A) 02/04/2024 1613   UROBILINOGEN 4.0 (H) 05/21/2009 1944   NITRITE NEGATIVE 04/10/2024 0527   LEUKOCYTESUR NEGATIVE 04/10/2024 0527     ----------------------------------------------------------------------------------------------------------------   Imaging Results:    DG Chest 2 View Result Date: 04/20/2024 EXAM: 2 VIEW(S) XRAY OF THE CHEST 04/20/2024 12:51:02 PM COMPARISON: Chest x-ray 03/05/2024, CXR 11/07/2023, CT chest 02/08/2024. CLINICAL HISTORY: chest pain, dyspnea, sickle cell FINDINGS: LINES, TUBES AND DEVICES: Right IJ Port-A-Cath stable in position. LUNGS AND PLEURA: Persistent prominent hilar vasculature and coarsened interstitial markings. Persistent Patchy airspace opacities of the lungs. No pleural effusion. No pneumothorax. HEART AND MEDIASTINUM: Mild cardiomegaly, unchanged. No acute abnormality of the mediastinal silhouette. BONES AND SOFT TISSUES: No acute osseous abnormality. IMPRESSION: 1. Mild pulmonary edema with underlying chronic coarsening interstitial markings and patchy airspace opacities. 2. Right IJ Port-A-Cath in stable position. Electronically signed by: Morgane Naveau MD 04/20/2024 01:40 PM EST RP Workstation: [id number]     Assessment & Plan:  Principal Problem:   Sickle cell anemia with crisis (HCC) Active Problems:   Generalized anxiety disorder   GERD (gastroesophageal reflux disease)   HIV (human immunodeficiency virus infection) (HCC)   Heart murmur   Chronic pain syndrome   History of pulmonary embolism  Acute on chronic respiratory failure with hypoxia (HCC)   Anemia of chronic disease   AKI (acute kidney injury)   Chronic hypoxic respiratory failure (HCC)   Hb Sickle Cell Disease with crisis: Admit patient, start IVF 0.45% Saline @ 125 mls/hour, start weight based Dilaudid  PCA, Restart oral home pain medications, Monitor vitals very closely, Re-evaluate pain scale regularly, 2 L of Oxygen  by [place], Patient will be re-evaluated for pain in the context of function and relationship to baseline as care progresses. Anemia of Chronic Disease: Hemoglobin within patient baseline no  clinical indication for transfusion.  Will continue to monitor daily CBC. Chronic pain Syndrome: Continue oral home pain medication. Chronic hypoxic respiratory failure: Patient is not in distress, saturations 90% on 6 L.  Anxiety and depression; patient is stable, denies suicidal ideations.  Continue medications as prescribed and follow-up outpatient as scheduled.  DVT Prophylaxis:  Eliquis   AM Labs Ordered, also please review Full Orders  Family Communication: Admission, patient's condition and plan of care including tests being ordered have been discussed with the patient who indicate understanding and agree with the plan and Code Status.  Code Status: Full Code  Consults called: None    Admission status: Inpatient    Time spent in minutes : 50 minutes  [name] Cover NP 04/20/2024 at 5:07 PM     [1]  Allergies Allergen Reactions   Ketorolac  Tromethamine  Swelling and Other (See Comments)    Patient reports facial edema and left arm edema after administration.   Tape Other (See Comments), Rash and Dermatitis    PLEASE DO NOT USE THE CLEAR, THICK, PLASTIC TAPE- only paper tape is tolerated  Paper Tape = ok   Ketorolac  Other (See Comments) and Swelling    Patient reports facial edema and left arm edema after administration.   Wound Dressing Adhesive Rash   Bactoshield Chg [Chlorhexidine  Gluconate] Other (See Comments)    Per patient it makes me breakout

## 2024-04-20 NOTE — ED Triage Notes (Signed)
 Patient ambulatory to triage  Reports sickle cell pain 10/10, chest and legs  X 3 days Home meds not working

## 2024-04-20 NOTE — ED Notes (Signed)
 Back from xray via w/c, alert, NAD, calm, interactive, resps e/u, speaking in clear complete sentences.

## 2024-04-20 NOTE — ED Notes (Signed)
 EDP at [facility]

## 2024-04-20 NOTE — ED Notes (Signed)
 Pt given 2 ham sandwiches and beverages per request  resting comfortably

## 2024-04-20 NOTE — ED Provider Notes (Signed)
 " [place] EMERGENCY DEPARTMENT AT [facility] Provider Note   CSN: [id number] Arrival date & time: 04/20/24  1123     Patient presents with: Sickle Cell Pain Crisis   Martin [name] is a 34 y.o. male.   HPI 34 year old male presents with sickle cell pain crisis.  He states he has been having a pain crisis in his back and hips for the past 3 days.  This is pretty typical.  However at the same time he has also had new chest pain and shortness of breath during these past 3 days.  He has had this before when he has had acute chest, like he did a few months ago.  He denies any significant cough and he has not had a fever.  He reports compliance with his meds including his Eliquis .  The pain has not been controlled with his home Percocet.  He is also having some upper abdominal pain.  No vomiting or diarrhea. No incontinence or weakness/numbness in the legs.  Prior to Admission medications  Medication Sig Start Date End Date Taking? Authorizing Provider  abacavir -dolutegravir -lamiVUDine  (TRIUMEQ ) 600-50-300 MG tablet Take 1 tablet by mouth daily. Patient taking differently: Take 1 tablet by mouth at bedtime. 08/13/23   Pearlean Manus, MD  albuterol  (VENTOLIN  HFA) 108 (90 Base) MCG/ACT inhaler Inhale 2 puffs into the lungs every 6 (six) hours as needed for wheezing or shortness of breath. 08/13/23   Pearlean Manus, MD  amoxicillin -clavulanate (AUGMENTIN ) 875-125 MG tablet Take 1 tablet by mouth 2 (two) times daily. 03/12/24   Jegede, Olugbemiga E, MD  apixaban  (ELIQUIS ) 5 MG TABS tablet Take 5 mg by mouth 2 (two) times daily.    [provider]  Cholecalciferol  (VITAMIN D3) 125 MCG (5000 UT) CAPS Take 5,000 Units by mouth every Tuesday.    [provider]  cyclobenzaprine  (FLEXERIL ) 5 MG tablet Take 1 tablet (5 mg total) by mouth 2 (two) times daily as needed for muscle spasms. 02/04/24   [name], [name], [name]  Deferasirox  (JADENU ) 360 MG TABS Take 2  tablets (720 mg total) by mouth daily. 11/05/23   [name], [name], [name]  diphenhydrAMINE  (BENADRYL ) 25 MG tablet Take 1 tablet (25 mg total) by mouth every 4 (four) hours as needed for itching. Patient taking differently: Take 25 mg by mouth daily as needed for itching. 08/13/23   Pearlean Manus, MD  folic acid  (FOLVITE ) 400 MCG tablet Take 1 tablet (400 mcg total) by mouth daily. 08/13/23   Pearlean Manus, MD  gabapentin  (NEURONTIN ) 300 MG capsule Take 1 capsule (300 mg total) by mouth at bedtime as needed (nerve pain). 08/13/23   Pearlean Manus, MD  guaiFENesin  (MUCINEX ) 600 MG 12 hr tablet Take 1 tablet (600 mg total) by mouth 2 (two) times daily. Patient taking differently: Take 600 mg by mouth 2 (two) times daily as needed for cough or to loosen phlegm. 08/13/23 08/12/24  Pearlean Manus, MD  guaiFENesin -dextromethorphan  (ROBITUSSIN DM) 100-10 MG/5ML syrup Take 10 mLs by mouth every 4 (four) hours as needed for cough. 03/12/24   Jegede, Olugbemiga E, MD  hydroxyurea  (HYDREA ) 500 MG capsule Take 4 capsules (2,000 mg total) by mouth at bedtime. 08/13/23   Pearlean Manus, MD  NARCAN  4 MG/0.1ML LIQD nasal spray kit Place 1 spray into the nose as needed (respiratory distress / opioid overdose). 01/07/23   [provider]  oxyCODONE -acetaminophen  (PERCOCET) 10-325 MG tablet Take 1 tablet by mouth every 4 (four) hours as needed for pain.  04/15/24   [name], [name], [name]  pantoprazole  (PROTONIX ) 40 MG tablet Take 1 tablet (40 mg total) by mouth daily as needed (acid reflux). 08/13/23   Pearlean Manus, MD  senna-docusate (SENOKOT-S) 8.6-50 MG tablet Take 2 tablets by mouth at bedtime. 08/13/23   Pearlean Manus, MD    Allergies: Ketorolac  tromethamine , Tape, Ketorolac , Wound dressing adhesive, and Bactoshield chg [chlorhexidine  gluconate]    Review of Systems  Constitutional:  Negative for fever.  Respiratory:  Positive for shortness of breath. Negative for cough.   Cardiovascular:   Positive for chest pain.  Musculoskeletal:  Positive for arthralgias and back pain.  Neurological:  Negative for weakness and numbness.    Updated Vital Signs BP 109/86   Pulse (!) 101   Temp 98.4 F (36.9 C) (Oral)   Resp 20   Ht 6' 3 (1.905 m)   Wt 63.5 kg   SpO2 90%   BMI 17.50 kg/m   Physical Exam Vitals and nursing note reviewed.  Constitutional:      General: He is not in acute distress.    Appearance: He is well-developed. He is not ill-appearing or diaphoretic.  HENT:     Head: Normocephalic and atraumatic.  Eyes:     General: Scleral icterus present.  Cardiovascular:     Rate and Rhythm: Regular rhythm. Tachycardia present.     Heart sounds: Normal heart sounds.     Comments: HR low 100s Pulmonary:     Effort: Pulmonary effort is normal.     Breath sounds: Normal breath sounds. No wheezing.  Chest:    Abdominal:     Palpations: Abdomen is soft.     Tenderness: There is abdominal tenderness in the right upper quadrant and epigastric area.  Musculoskeletal:     Lumbar back: Tenderness present.     Right hip: Normal range of motion.     Left hip: Normal range of motion.  Skin:    General: Skin is warm and dry.  Neurological:     Mental Status: He is alert.     (all labs ordered are listed, but only abnormal results are displayed) Labs Reviewed  CBC WITH DIFFERENTIAL/PLATELET - Abnormal; Notable for the following components:      Result Value   RBC 1.79 (*)    Hemoglobin 9.0 (*)    HCT 17.5 (*)    MCH 50.3 (*)    MCHC 51.4 (*)    RDW 29.2 (*)    nRBC 50.0 (*)    All other components within normal limits  RETICULOCYTES - Abnormal; Notable for the following components:   Retic Ct Pct 21.2 (*)    RBC. 2.50 (*)    Retic Count, Absolute 529.5 (*)    Immature Retic Fract 30.2 (*)    All other components within normal limits  COMPREHENSIVE METABOLIC PANEL WITH GFR - Abnormal; Notable for the following components:   Calcium  8.7 (*)    AST 62 (*)     Total Bilirubin 11.0 (*)    All other components within normal limits  CULTURE, BLOOD (ROUTINE X 2)  CULTURE, BLOOD (ROUTINE X 2)  LIPASE, BLOOD  PRO BRAIN NATRIURETIC PEPTIDE  TYPE AND SCREEN  TROPONIN T, HIGH SENSITIVITY    EKG: EKG Interpretation Date/Time:  Thursday April 20 2024 11:31:55 EST Ventricular Rate:  117 PR Interval:  144 QRS Duration:  87 QT Interval:  338 QTC Calculation: 472 R Axis:   106  Text Interpretation: Sinus tachycardia Biatrial enlargement RVH  with secondary repolarization abnrm Borderline prolonged QT interval HR is faster, otherwise ECG similar to Dec 2025 Confirmed by Freddi Hamilton [phone]) on 04/20/2024 12:25:39 PM  Radiology: DG Chest 2 View Result Date: 04/20/2024 EXAM: 2 VIEW(S) XRAY OF THE CHEST 04/20/2024 12:51:02 PM COMPARISON: Chest x-ray 03/05/2024, CXR 11/07/2023, CT chest 02/08/2024. CLINICAL HISTORY: chest pain, dyspnea, sickle cell FINDINGS: LINES, TUBES AND DEVICES: Right IJ Port-A-Cath stable in position. LUNGS AND PLEURA: Persistent prominent hilar vasculature and coarsened interstitial markings. Persistent Patchy airspace opacities of the lungs. No pleural effusion. No pneumothorax. HEART AND MEDIASTINUM: Mild cardiomegaly, unchanged. No acute abnormality of the mediastinal silhouette. BONES AND SOFT TISSUES: No acute osseous abnormality. IMPRESSION: 1. Mild pulmonary edema with underlying chronic coarsening interstitial markings and patchy airspace opacities. 2. Right IJ Port-A-Cath in stable position. Electronically signed by: Morgane Naveau MD 04/20/2024 01:40 PM EST RP Workstation: [id number]     .Critical Care  Performed by: Freddi Hamilton, MD Authorized by: Freddi Hamilton, MD   Critical care provider statement:    Critical care time (minutes):  30   Critical care time was exclusive of:  Separately billable procedures and treating other patients   Critical care was necessary to treat or prevent imminent or life-threatening  deterioration of the following conditions:  Respiratory failure   Critical care was time spent personally by me on the following activities:  Development of treatment plan with patient or surrogate, discussions with consultants, evaluation of patient's response to treatment, examination of patient, ordering and review of laboratory studies, ordering and review of radiographic studies, ordering and performing treatments and interventions, pulse oximetry, re-evaluation of patient's condition and review of old charts    Medications Ordered in the ED  azithromycin  (ZITHROMAX ) 500 mg in sodium chloride  0.9 % 250 mL IVPB (500 mg Intravenous New Bag/Given 04/20/24 1621)  cefTRIAXone  (ROCEPHIN ) 1 g in sodium chloride  0.9 % 100 mL IVPB (1 g Intravenous New Bag/Given 04/20/24 1556)  naloxone  (NARCAN ) injection 0.4 mg (has no administration in time range)    And  sodium chloride  flush (NS) 0.9 % injection 9 mL (has no administration in time range)  ondansetron  (ZOFRAN ) injection 4 mg (has no administration in time range)  diphenhydrAMINE  (BENADRYL ) capsule 25 mg (has no administration in time range)  HYDROmorphone  (DILAUDID ) 1 mg/mL PCA injection (has no administration in time range)  HYDROmorphone  (DILAUDID ) injection 2 mg (2 mg Intravenous Given 04/20/24 1322)  HYDROmorphone  (DILAUDID ) injection 2 mg (2 mg Intravenous Given 04/20/24 1416)  HYDROmorphone  (DILAUDID ) injection 2 mg (2 mg Intravenous Given 04/20/24 1447)                                    Medical Decision Making Amount and/or Complexity of Data Reviewed External Data Reviewed: notes. Labs: ordered.    Details: Stable hemoglobin Radiology: ordered and independent interpretation performed.    Details: Worsening pulmonary infiltrates ECG/medicine tests: ordered and independent interpretation performed.    Details: Sinus tachycardia  Risk Prescription drug management. Decision regarding hospitalization.   Patient presents with chest pain,  shortness of breath, and pain typical of his sickle cell pain crisis.  He was given IV pain control.  He is requiring 6 L rather than his normal 3-4 L of oxygen .  No respiratory distress.  I am concerned he might have acute chest given worsening chest x-ray findings.  Started on antibiotics.  Discussed with [name] Cover, who will admit.  Final diagnoses:  Sickle cell pain crisis (HCC)  Acute on chronic respiratory failure with hypoxia (HCC)  Acute chest syndrome [facility])    ED Discharge Orders     None          Freddi Hamilton, MD 04/20/24 1626  "

## 2024-04-21 LAB — CBC
HCT: 23.1 % — ABNORMAL LOW (ref 39.0–52.0)
Hemoglobin: 8.4 g/dL — ABNORMAL LOW (ref 13.0–17.0)
MCH: 34.9 pg — ABNORMAL HIGH (ref 26.0–34.0)
MCHC: 36.4 g/dL — ABNORMAL HIGH (ref 30.0–36.0)
MCV: 95.9 fL (ref 80.0–100.0)
Platelets: 252 K/uL (ref 150–400)
RBC: 2.41 MIL/uL — ABNORMAL LOW (ref 4.22–5.81)
RDW: 26.1 % — ABNORMAL HIGH (ref 11.5–15.5)
WBC: 15 K/uL — ABNORMAL HIGH (ref 4.0–10.5)
nRBC: 41 % — ABNORMAL HIGH (ref 0.0–0.2)

## 2024-04-21 MED ORDER — HYDROXYUREA 500 MG PO CAPS
2000.0000 mg | ORAL_CAPSULE | Freq: Every day | ORAL | Status: DC
  Administered 2024-04-21 – 2024-04-30 (×10): 2000 mg via ORAL
  Filled 2024-04-21 (×10): qty 4

## 2024-04-21 NOTE — Progress Notes (Signed)
 Patient ID: Martin [name], male   DOB: 1990/10/24, 34 y.o.   MRN: [id number] Subjective: Martin [name]  is a 34 y.o. male  with history of sickle cell disease, history of acute chest syndrome, multiple sickle cell crisis, multiple ED visits and hospital admission, history of PE currently on Eliquis , chronic hypoxic respiratory failure on home oxygen , HIV infection and generalized anxiety disorder who presented to the emergency department with major complaints of generalized body pain, shortness of breath, and chest pain that is consistent with his typical sickle cell pain crisis.  Patient continues to endorse pain of 9/10 this morning.  He is a little bit anxious.  Advised patient to continue wearing oxygen , keeping his nasal cannula on at all times.  Encouraged deep breathing and pushing PCA pump.  Objective:  Vital signs in last 24 hours:  Vitals:   04/21/24 1009 04/21/24 1206 04/21/24 1509 04/21/24 1648  BP: 90/71  121/79   Pulse: (!) 121  (!) 113   Resp: 16 16 20 20   Temp: 98.2 F (36.8 C)     TempSrc: Oral     SpO2: (!) 82% 90% (!) 85% 90%  Weight:      Height:        Intake/Output from previous day:  No intake or output data in the 24 hours ending 04/21/24 1705  Physical Exam: General: Alert, awake, oriented x3, in no acute distress.  HEENT: [place]/AT PEERL, EOMI Neck: Trachea midline,  no masses, no thyromegal,y no JVD, no carotid bruit OROPHARYNX:  Moist, No exudate/ erythema/lesions.  Heart: Murmur Lungs shortness of breath  abdomen: Soft, nontender, nondistended, positive bowel sounds, no masses no hepatosplenomegaly noted..  Neuro: No focal neurological deficits noted cranial nerves II through XII grossly intact. DTRs 2+ bilaterally upper and lower extremities. Strength 5 out of 5 in bilateral upper and lower extremities. Musculoskeletal: Generalized body tenderness Psychiatric: Patient alert and oriented x3, good insight and cognition, good recent to remote  recall. Lymph node survey: No cervical axillary or inguinal lymphadenopathy noted.  Lab Results:  Basic Metabolic Panel:    Component Value Date/Time   NA 142 04/20/2024 1310   NA 140 01/12/2023 1607   K 3.7 04/20/2024 1310   CL 110 04/20/2024 1310   CO2 25 04/20/2024 1310   BUN 6 04/20/2024 1310   BUN 8 01/12/2023 1607   CREATININE 0.85 04/20/2024 1310   GLUCOSE 82 04/20/2024 1310   CALCIUM  8.7 (L) 04/20/2024 1310   CBC:    Component Value Date/Time   WBC 15.0 (H) 04/21/2024 0527   HGB 8.4 (L) 04/21/2024 0527   HGB 7.4 (L) 01/12/2023 1607   HCT 23.1 (L) 04/21/2024 0527   HCT 21.7 (L) 01/12/2023 1607   PLT 252 04/21/2024 0527   PLT 321 01/12/2023 1607   MCV 95.9 04/21/2024 0527   MCV 94 01/12/2023 1607   NEUTROABS 5.2 04/20/2024 1310   NEUTROABS 3.8 01/12/2023 1607   LYMPHSABS 3.2 04/20/2024 1310   LYMPHSABS 5.6 (H) 01/12/2023 1607   MONOABS 1.0 04/20/2024 1310   EOSABS 0.1 04/20/2024 1310   EOSABS 0.2 01/12/2023 1607   BASOSABS 0.0 04/20/2024 1310   BASOSABS 0.1 01/12/2023 1607    Recent Results (from the past 240 hours)  Culture, blood (routine x 2)     Status: None (Preliminary result)   Collection Time: 04/20/24  8:08 PM   Specimen: BLOOD RIGHT HAND  Result Value Ref Range Status   Specimen Description   Final  BLOOD RIGHT HAND Performed at [facility], [address] [place], [place] [address]    Special Requests   Final    BOTTLES DRAWN AEROBIC ONLY Blood Culture results may not be optimal due to an inadequate volume of blood received in culture bottles Performed at [facility], [address] [place], [place] [address]    Culture   Final    NO GROWTH < 12 HOURS Performed at [facility], [address], [place], [place] [address]    Report Status PENDING  Incomplete  Culture, blood (routine x 2)     Status: None (Preliminary result)   Collection Time: 04/20/24  8:08 PM   Specimen: BLOOD LEFT HAND   Result Value Ref Range Status   Specimen Description   Final    BLOOD LEFT HAND Performed at [facility], [address], [place], [place] [address]    Special Requests   Final    BOTTLES DRAWN AEROBIC ONLY Blood Culture results may not be optimal due to an inadequate volume of blood received in culture bottles Performed at [facility], [address] [place], [place] [address]    Culture   Final    NO GROWTH < 12 HOURS Performed at [facility], [address] [place], [place] [address]    Report Status PENDING  Incomplete    Studies/Results: DG Chest 2 View Result Date: 04/20/2024 EXAM: 2 VIEW(S) XRAY OF THE CHEST 04/20/2024 12:51:02 PM COMPARISON: Chest x-ray 03/05/2024, CXR 11/07/2023, CT chest 02/08/2024. CLINICAL HISTORY: chest pain, dyspnea, sickle cell FINDINGS: LINES, TUBES AND DEVICES: Right IJ Port-A-Cath stable in position. LUNGS AND PLEURA: Persistent prominent hilar vasculature and coarsened interstitial markings. Persistent Patchy airspace opacities of the lungs. No pleural effusion. No pneumothorax. HEART AND MEDIASTINUM: Mild cardiomegaly, unchanged. No acute abnormality of the mediastinal silhouette. BONES AND SOFT TISSUES: No acute osseous abnormality. IMPRESSION: 1. Mild pulmonary edema with underlying chronic coarsening interstitial markings and patchy airspace opacities. 2. Right IJ Port-A-Cath in stable position. Electronically signed by: Morgane Naveau MD 04/20/2024 01:40 PM EST RP Workstation: [id number]    Medications: Scheduled Meds:  abacavir -dolutegravir -lamiVUDine   1 tablet Oral QHS   apixaban   5 mg Oral BID   folic acid   1 mg Oral Daily   HYDROmorphone    Intravenous Q4H   hydroxyurea   2,000 mg Oral QHS   senna-docusate  1 tablet Oral BID   Continuous Infusions: PRN Meds:.albuterol , diphenhydrAMINE , HYDROmorphone  (DILAUDID ) injection, lip balm, naloxone  **AND** sodium chloride  flush, ondansetron  (ZOFRAN ) IV,  oxyCODONE -acetaminophen  **AND** oxyCODONE , polyethylene glycol  Consultants: None  Procedures: None  Antibiotics: None  Assessment/Plan: Principal Problem:   Sickle cell anemia with crisis (HCC) Active Problems:   Generalized anxiety disorder   GERD (gastroesophageal reflux disease)   HIV (human immunodeficiency virus infection) (HCC)   Heart murmur   Chronic pain syndrome   History of pulmonary embolism   Acute on chronic respiratory failure with hypoxia (HCC)   Anemia of chronic disease   AKI (acute kidney injury)   Chronic hypoxic respiratory failure (HCC)   Hb Sickle Cell Disease with Pain crisis: Continue IVF 0.45% Saline @ 125 mls/hour, continue weight based Dilaudid  PCA, IV Toradol  15 mg Q 6 H for a total of 5 days, continue oral home pain medications as ordered. Monitor vitals very closely, Re-evaluate pain scale regularly, 2 L of Oxygen  by [place]. Patient encouraged to ambulate on the hallway today.  Anemia of Chronic  Disease: hemoglobin within patient's baseline no clinical indication for transfusion. Chronic pain Syndrome: Continue oral home pain medication as prescribed. Chronic hypoxic respiratory failure-patient is not in distress, saturations improving, on 6 L oxygen .  Will continue to monitor Anxiety and depression-patient is stable, denies suicidal ideations.  Continue medications as prescribed and follow-up outpatient as scheduled.  Code Status: Full Code Family Communication: N/A Disposition Plan: Not yet ready for discharge : Patient has a follow-up appointment with cardiology on Monday.  Advised to discharge on Sunday if possible to make appointment as he has had several cancellations.  [name] Cover NP  If 7PM-7AM, please contact night-coverage.  04/21/2024, 5:05 PM  LOS: 1 day

## 2024-04-21 NOTE — TOC Initial Note (Signed)
 Transition of Care [facility]) - Initial/Assessment Note    Patient Details  Name: Martin [name] MRN: [id number] Date of Birth: 1991/02/22  Transition of Care [facility]) CM/SW Contact:    Toy LITTIE Agar, RN Phone [phone]  04/21/2024, 12:57 PM  Clinical Narrative:                 Inpatient care manager following patient with high risk for readmission. Patient is from home where he is O2 dependent but otherwise independent. Patient follows up with the patient care center. Patient states that he currently has walker but no other DME needs or HH needs noted. Currently there are no inpatient care management needs.   Expected Discharge Plan: Home/Self Care Barriers to Discharge: Continued Medical Work up   Patient Goals and CMS Choice Patient states their goals for this hospitalization and ongoing recovery are:: Decreased pain   Choice offered to / list presented to : NA      Expected Discharge Plan and Services In-house Referral: NA Discharge Planning Services: CM Consult Post Acute Care Choice: NA Living arrangements for the past 2 months: Homeless (lives with different people when he can)                 DME Arranged: N/A DME Agency: NA       HH Arranged: NA HH Agency: NA        Prior Living Arrangements/Services Living arrangements for the past 2 months: Homeless (lives with different people when he can) Lives with:: Relatives Patient language and need for interpreter reviewed:: Yes Do you feel safe going back to the place where you live?: Yes      Need for Family Participation in Patient Care: No (Comment) Care giver support system in place?: Yes (comment) Current home services: DME (Has home O2 and walker) Criminal Activity/Legal Involvement Pertinent to Current Situation/Hospitalization: No - Comment as needed  Activities of Daily Living   ADL Screening (condition at time of admission) Independently performs ADLs?: Yes (appropriate for developmental  age) Is the patient deaf or have difficulty hearing?: No Does the patient have difficulty seeing, even when wearing glasses/contacts?: No Does the patient have difficulty concentrating, remembering, or making decisions?: No  Permission Sought/Granted Permission sought to share information with : Family Supports Permission granted to share information with : No              Emotional Assessment Appearance:: Appears stated age Attitude/Demeanor/Rapport: Gracious Affect (typically observed): Pleasant Orientation: : Oriented to Self, Oriented to Place, Oriented to  Time, Oriented to Situation Alcohol  / Substance Use: Not Applicable Psych Involvement: No (comment)  Admission diagnosis:  Sickle cell anemia with crisis (HCC) [D57.00] Sickle cell pain crisis (HCC) [D57.00] Acute chest syndrome (HCC) [D57.01] Acute on chronic respiratory failure with hypoxia (HCC) [J96.21] Patient Active Problem List   Diagnosis Date Noted   Sickle cell anemia with crisis (HCC) 04/20/2024   Peripheral edema 03/25/2024   Pulmonary hypertension, primary (HCC) 03/21/2024   Edema 03/20/2024   Right shoulder pain 03/08/2024   Sickle cell pain crisis (HCC) 02/08/2024   Right flank pain 02/04/2024   Elevated brain natriuretic peptide (BNP) level 02/04/2024   Chest pain 02/04/2024   Acute cystitis with hematuria 02/04/2024   Muscle spasm 02/04/2024   Decrease in appetite 02/04/2024   Acute on chronic hypoxic respiratory failure (HCC) 01/16/2024   Chronic anticoagulation 12/18/2023   Sickle cell crisis (HCC) 10/12/2023   Chronic hypoxic respiratory failure (HCC) 08/04/2023   Iron overload,  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   Influenza A with pneumonia 06/20/2023   Acute hypoxemic respiratory failure (HCC) 06/20/2023   AKI (acute kidney injury) 06/20/2023   Influenza A with respiratory manifestations 06/20/2023   Acute on chronic anemia 03/16/2023   History of HIV infection (HCC) 03/16/2023   Hypokalemia  03/16/2023   Anemia of chronic disease 07/13/2022   Hyperbilirubinemia 07/13/2022   Tinea capitis 07/29/2021   Bacteremia due to Enterococcus 08/29/2020   Acute chest syndrome (HCC) 08/26/2020   Hypoxia 07/09/2020   COVID-19 05/13/2020   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/18/2020   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   Abnormal penile discharge, without blood 02/22/2020   Leukocytosis 01/02/2020   Anxiety 11/27/2019   Proteinuria 11/27/2019   Acute on chronic respiratory failure with hypoxia (HCC) 11/16/2019   Chronic, continuous use of opioids 08/15/2019   Seasonal allergies 08/15/2019   Chest congestion    Chronic pain syndrome 08/04/2019   History of pulmonary embolism 08/04/2019   Acute pulmonary embolism (HCC) 02/10/2019   Acute chest syndrome due to sickle cell crisis (HCC) 02/10/2019   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) 03/09/2018   Bone pain 03/07/2018   Hip pain 03/07/2018   Acute bronchitis due to Streptococcus 02/21/2018   Heart murmur 02/03/2017   Sickle cell disease with crisis (HCC) 02/02/2017   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 [place] [address] Phone: [phone] Fax: [phone]     Social Drivers of Health (SDOH) Social History: SDOH Screenings   Food Insecurity: Food Insecurity Present (04/20/2024)  Housing: High Risk (04/20/2024)  Transportation Needs: No Transportation Needs (04/20/2024)  Utilities: Not At Risk (04/20/2024)  Depression (PHQ2-9): Low Risk (02/18/2024)  Financial Resource Strain: Low Risk (02/21/2024)   Physical Activity: Sufficiently Active (10/09/2023)   Received from [facility]  Social Connections: Socially Isolated (03/04/2024)  Stress: No Stress Concern Present (10/09/2023)   Received from [facility]  Tobacco Use: Low Risk (04/20/2024)   SDOH Interventions:     Readmission Risk Interventions    04/21/2024   12:42 PM 03/07/2024    9:22 AM 02/09/2024    3:42 PM  Readmission Risk Prevention Plan  Transportation Screening Complete Complete Complete  Medication Review Oceanographer) Complete Complete Complete  PCP or Specialist appointment within 3-5 days of discharge Complete Complete Complete  HRI or Home Care Consult Complete Complete Complete  SW Recovery Care/Counseling Consult Complete Complete Complete  Palliative Care Screening Not Applicable Not Applicable Not Applicable  Skilled Nursing Facility Not Applicable Not Applicable Not Applicable

## 2024-04-22 ENCOUNTER — Inpatient Hospital Stay (HOSPITAL_COMMUNITY): Payer: Self-pay

## 2024-04-22 DIAGNOSIS — D5700 Hb-SS disease with crisis, unspecified: Secondary | ICD-10-CM

## 2024-04-22 LAB — HEPATIC FUNCTION PANEL
ALT: 72 U/L — ABNORMAL HIGH (ref 0–44)
AST: 118 U/L — ABNORMAL HIGH (ref 15–41)
Albumin: 3.9 g/dL (ref 3.5–5.0)
Alkaline Phosphatase: 85 U/L (ref 38–126)
Bilirubin, Direct: 2.2 mg/dL — ABNORMAL HIGH (ref 0.0–0.2)
Indirect Bilirubin: 11.3 mg/dL — ABNORMAL HIGH (ref 0.3–0.9)
Total Bilirubin: 13.5 mg/dL — ABNORMAL HIGH (ref 0.0–1.2)
Total Protein: 8 g/dL (ref 6.5–8.1)

## 2024-04-22 LAB — CBC
HCT: 31.3 % — ABNORMAL LOW (ref 39.0–52.0)
Hemoglobin: 11.2 g/dL — ABNORMAL LOW (ref 13.0–17.0)
MCH: 33.3 pg (ref 26.0–34.0)
MCHC: 35.8 g/dL (ref 30.0–36.0)
MCV: 93.2 fL (ref 80.0–100.0)
Platelets: 179 K/uL (ref 150–400)
RBC: 3.36 MIL/uL — ABNORMAL LOW (ref 4.22–5.81)
RDW: 26.1 % — ABNORMAL HIGH (ref 11.5–15.5)
WBC: 10.7 K/uL — ABNORMAL HIGH (ref 4.0–10.5)
nRBC: 108.2 % — ABNORMAL HIGH (ref 0.0–0.2)

## 2024-04-22 LAB — LACTATE DEHYDROGENASE: LDH: 851 U/L — ABNORMAL HIGH (ref 105–235)

## 2024-04-22 MED ORDER — CHLORHEXIDINE GLUCONATE CLOTH 2 % EX PADS: 6.0000 | MEDICATED_PAD | Freq: Every day | CUTANEOUS | Status: DC

## 2024-04-22 MED ORDER — SODIUM CHLORIDE 0.9 % IV SOLN
2.0000 g | Freq: Every day | INTRAVENOUS | Status: DC
  Administered 2024-04-22 – 2024-04-27 (×6): 2 g via INTRAVENOUS
  Filled 2024-04-22 (×5): qty 20

## 2024-04-22 MED ORDER — SODIUM CHLORIDE 0.9% FLUSH: 10.0000 mL | INTRAVENOUS | Status: DC | PRN

## 2024-04-22 MED ORDER — BISACODYL 10 MG RE SUPP
10.0000 mg | Freq: Once | RECTAL | Status: AC
  Administered 2024-04-23: 10 mg via RECTAL
  Filled 2024-04-22: qty 1

## 2024-04-22 MED ORDER — SODIUM CHLORIDE 0.9% FLUSH
10.0000 mL | Freq: Two times a day (BID) | INTRAVENOUS | Status: DC
  Administered 2024-04-22 – 2024-04-26 (×7): 10 mL
  Administered 2024-04-26: 20 mL
  Administered 2024-04-27 – 2024-05-01 (×9): 10 mL

## 2024-04-22 NOTE — Plan of Care (Signed)

## 2024-04-22 NOTE — Progress Notes (Cosign Needed Addendum)
 Subjective: Martin [name] is a 34 year old male with a medical history significant for sickle cell disease, chronic pain syndrome, HIV infection, history of pulmonary embolism on Eliquis , generalized anxiety, and anemia of chronic disease was admitted for sickle cell pain crisis. Today, patient has a new complaint of chest pain and shortness of breath.  He has an oxygen  requirement of 6 L.  Chest x-ray from 04/20/2024 showed mild pulmonary edema with underlying chronic coarsening interstitial markings and patchy airspace opacities. Patient is also complaining of pain to low back and lower extremities.  Pain persists despite IV Dilaudid  PCA.  He denies any headache, dizziness, urinary symptoms, nausea, vomiting, or diarrhea.  Objective:  Vital signs in last 24 hours:  Vitals:   04/22/24 0628 04/22/24 0833 04/22/24 1134 04/22/24 1141  BP: 112/75   116/85  Pulse: 97   (!) 101  Resp: 18 18 16 20   Temp: 97.6 F (36.4 C)   98 F (36.7 C)  TempSrc:    Oral  SpO2: (!) 86% (!) 89%  (!) 88%  Weight:      Height:        Intake/Output from previous day:   Intake/Output Summary (Last 24 hours) at 04/22/2024 1151 Last data filed at 04/22/2024 0835 Gross per 24 hour  Intake 252.5 ml  Output 1200 ml  Net -947.5 ml    Physical Exam: General: Alert, awake, oriented x3 HEENT: [place]/AT PEERL, EOMI.  Scleral icterus Neck: Trachea midline,  no masses, no thyromegal,y no JVD, no carotid bruit OROPHARYNX:  Moist, No exudate/ erythema/lesions.  Heart: Regular rate and rhythm, murmur present Lungs: Dyspnea, tachypneic, using accessory muscles Abdomen: Soft, nontender, nondistended, positive bowel sounds, no masses no hepatosplenomegaly noted..  Neuro: No focal neurological deficits noted cranial nerves II through XII grossly intact. DTRs 2+ bilaterally upper and lower extremities. Strength 5 out of 5 in bilateral upper and lower extremities. Musculoskeletal: No warm swelling or erythema around joints, no  spinal tenderness noted. Psychiatric: Patient alert and oriented x3, good insight and cognition, good recent to remote recall. Lymph node survey: No cervical axillary or inguinal lymphadenopathy noted.  Lab Results:  Basic Metabolic Panel:    Component Value Date/Time   NA 142 04/20/2024 1310   NA 140 01/12/2023 1607   K 3.7 04/20/2024 1310   CL 110 04/20/2024 1310   CO2 25 04/20/2024 1310   BUN 6 04/20/2024 1310   BUN 8 01/12/2023 1607   CREATININE 0.85 04/20/2024 1310   GLUCOSE 82 04/20/2024 1310   CALCIUM  8.7 (L) 04/20/2024 1310   CBC:    Component Value Date/Time   WBC 10.7 (H) 04/22/2024 0706   HGB 11.2 (L) 04/22/2024 0706   HGB 7.4 (L) 01/12/2023 1607   HCT 31.3 (L) 04/22/2024 0706   HCT 21.7 (L) 01/12/2023 1607   PLT 179 04/22/2024 0706   PLT 321 01/12/2023 1607   MCV 93.2 04/22/2024 0706   MCV 94 01/12/2023 1607   NEUTROABS 5.2 04/20/2024 1310   NEUTROABS 3.8 01/12/2023 1607   LYMPHSABS 3.2 04/20/2024 1310   LYMPHSABS 5.6 (H) 01/12/2023 1607   MONOABS 1.0 04/20/2024 1310   EOSABS 0.1 04/20/2024 1310   EOSABS 0.2 01/12/2023 1607   BASOSABS 0.0 04/20/2024 1310   BASOSABS 0.1 01/12/2023 1607    Recent Results (from the past 240 hours)  Culture, blood (routine x 2)     Status: None (Preliminary result)   Collection Time: 04/20/24  8:08 PM   Specimen: BLOOD RIGHT HAND  Result Value Ref Range Status   Specimen Description   Final    BLOOD RIGHT HAND Performed at [facility], [address], [place], [place] [address]    Special Requests   Final    BOTTLES DRAWN AEROBIC ONLY Blood Culture results may not be optimal due to an inadequate volume of blood received in culture bottles Performed at [facility], [address] [place], [place] [address]    Culture   Final    NO GROWTH 1 DAY Performed at [facility], [address], [place], [place] [address]    Report Status PENDING  Incomplete  Culture, blood  (routine x 2)     Status: None (Preliminary result)   Collection Time: 04/20/24  8:08 PM   Specimen: BLOOD LEFT HAND  Result Value Ref Range Status   Specimen Description   Final    BLOOD LEFT HAND Performed at [facility], [address] [place], [place] [address]    Special Requests   Final    BOTTLES DRAWN AEROBIC ONLY Blood Culture results may not be optimal due to an inadequate volume of blood received in culture bottles Performed at [facility], [address], [place], [place] [address]    Culture   Final    NO GROWTH 1 DAY Performed at [facility], [address] [place], [place] [address]    Report Status PENDING  Incomplete    Studies/Results: DG Chest 2 View Result Date: 04/20/2024 EXAM: 2 VIEW(S) XRAY OF THE CHEST 04/20/2024 12:51:02 PM COMPARISON: Chest x-ray 03/05/2024, CXR 11/07/2023, CT chest 02/08/2024. CLINICAL HISTORY: chest pain, dyspnea, sickle cell FINDINGS: LINES, TUBES AND DEVICES: Right IJ Port-A-Cath stable in position. LUNGS AND PLEURA: Persistent prominent hilar vasculature and coarsened interstitial markings. Persistent Patchy airspace opacities of the lungs. No pleural effusion. No pneumothorax. HEART AND MEDIASTINUM: Mild cardiomegaly, unchanged. No acute abnormality of the mediastinal silhouette. BONES AND SOFT TISSUES: No acute osseous abnormality. IMPRESSION: 1. Mild pulmonary edema with underlying chronic coarsening interstitial markings and patchy airspace opacities. 2. Right IJ Port-A-Cath in stable position. Electronically signed by: Morgane Naveau MD 04/20/2024 01:40 PM EST RP Workstation: [id number]    Medications: Scheduled Meds:  abacavir -dolutegravir -lamiVUDine   1 tablet Oral QHS   apixaban   5 mg Oral BID   folic acid   1 mg Oral Daily   HYDROmorphone    Intravenous Q4H   hydroxyurea   2,000 mg Oral QHS   senna-docusate  1 tablet Oral BID   sodium chloride  flush  10-40 mL Intracatheter Q12H    Continuous Infusions: PRN Meds:.albuterol , diphenhydrAMINE , HYDROmorphone  (DILAUDID ) injection, lip balm, naloxone  **AND** sodium chloride  flush, ondansetron  (ZOFRAN ) IV, oxyCODONE -acetaminophen  **AND** oxyCODONE , polyethylene glycol, sodium chloride  flush  Consultants: None  Procedures: none  Antibiotics: Cephalexin    Assessment/Plan: Principal Problem:   Sickle cell anemia with crisis (HCC) Active Problems:   Generalized anxiety disorder   GERD (gastroesophageal reflux disease)   HIV (human immunodeficiency virus infection) (HCC)   Heart murmur   Chronic pain syndrome   History of pulmonary embolism   Acute on chronic respiratory failure with hypoxia (HCC)   Anemia of chronic disease   AKI (acute kidney injury)   Chronic hypoxic respiratory failure (HCC)  Sickle cell disease with pain crisis: Decrease IV fluids to KVO Continue IV Dilaudid  PCA without changes today Continue hydroxyurea  Monitor vital signs very closely, reevaluate pain scale regularly, and supplemental oxygen  as needed Repeat CBC and LDH in  am.   Acute on chronic hypoxia with respiratory failure:  Patient is on chronic oxygen  therapy at home and requires 2-3 L/min for long standing low oxygen  levels. He currently has an oxygen  requirement of 6L/min. Continue supplemental oxygen , wean as tolerated. Oxygen  saturation 86% on 6L Repeat chest xray.   Anemia of chronic disease: Today, patient's hemoglobin is 11.2 which is above baseline.  There is no clinical indication for blood transfusion at this time.    Probable community acquired pneumonia versus acute chest syndrome.  On admission, chest xray showed mild pulmonary edema with underlying chronic coarsening interstitial markings and patchy air space opacities. Current oxygen  requirement is 6 L/min. Repear chest xray today. Initiate IV antibiotics.  Repeat blood cultures Will continue to monitor closely and consider simple exchange transfusion in  am.  HIV: Continue antiviral medications.  CD4 count today.  Review previous labs, CD4 percent 29.2 patient states that he has been taking antiviral medications consistently and is followed by infectious disease.   Hyperbilirubinemia:  Total bilirubin markedly elevated at 11, secondary to sickle cell crisis. Review hepatic function panel as results become available.   History of pulmonary embolism:  Continue Eliquis    Code Status: Full Code Family Communication: N/A Disposition Plan: Not yet ready for discharge   Bertram Georgina Bald  DNP, APRN, [name] [facility] [address] [place], [place] [address] [phone]  If 7PM-7AM, please contact night-coverage.  04/22/2024, 11:51 AM  LOS: 2 days

## 2024-04-22 NOTE — Progress Notes (Signed)
 Pt stated he felt short of breath, currently on 6L O2 [place]. Pt is refusing PRN Nebulizer stated it makes him dizzy. Pt educated on medication and need to maintain O2 at 90% or greater. Pt verbalized understanding, provider notified.

## 2024-04-22 NOTE — Plan of Care (Addendum)
 1400: Patient SOB, Wheezy, and Oxygenation at 88% continuously. Pt on 6L [place], refusing PRN Albuterol  nebulizer txt. Upon reassessment pt Oxygen  level maintaining at 92% with activity. Will continue to monitor and assess patient-On continuous pulse ox monitoring at the bedside.   Problem: Education: Goal: Knowledge of vaso-occlusive preventative measures will improve Outcome: Progressing Goal: Awareness of infection prevention will improve Outcome: Progressing Goal: Awareness of signs and symptoms of anemia will improve Outcome: Progressing Goal: Long-term complications will improve Outcome: Progressing   Problem: Self-Care: Goal: Ability to incorporate actions that prevent/reduce pain crisis will improve Outcome: Progressing   Problem: Bowel/Gastric: Goal: Gut motility will be maintained Outcome: Progressing   Problem: Tissue Perfusion: Goal: Complications related to inadequate tissue perfusion will be avoided or minimized Outcome: Progressing   Problem: Respiratory: Goal: Pulmonary complications will be avoided or minimized Outcome: Progressing Goal: Acute Chest Syndrome will be identified early to prevent complications Outcome: Progressing   Problem: Fluid Volume: Goal: Ability to maintain a balanced intake and output will improve Outcome: Progressing   Problem: Sensory: Goal: Pain level will decrease with appropriate interventions Outcome: Progressing   Problem: Health Behavior: Goal: Postive changes in compliance with treatment and prescription regimens will improve Outcome: Progressing   Problem: Education: Goal: Knowledge of General Education information will improve Description: Including pain rating scale, medication(s)/side effects and non-pharmacologic comfort measures Outcome: Progressing   Problem: Health Behavior/Discharge Planning: Goal: Ability to manage health-related needs will improve Outcome: Progressing   Problem: Clinical Measurements: Goal:  Ability to maintain clinical measurements within normal limits will improve Outcome: Progressing Goal: Will remain free from infection Outcome: Progressing Goal: Diagnostic test results will improve Outcome: Progressing Goal: Respiratory complications will improve Outcome: Progressing Goal: Cardiovascular complication will be avoided Outcome: Progressing   Problem: Activity: Goal: Risk for activity intolerance will decrease Outcome: Progressing   Problem: Nutrition: Goal: Adequate nutrition will be maintained Outcome: Progressing   Problem: Coping: Goal: Level of anxiety will decrease Outcome: Progressing   Problem: Elimination: Goal: Will not experience complications related to bowel motility Outcome: Progressing Goal: Will not experience complications related to urinary retention Outcome: Progressing   Problem: Pain Managment: Goal: General experience of comfort will improve and/or be controlled Outcome: Progressing   Problem: Safety: Goal: Ability to remain free from injury will improve Outcome: Progressing   Problem: Skin Integrity: Goal: Risk for impaired skin integrity will decrease Outcome: Progressing

## 2024-04-22 NOTE — Progress Notes (Signed)
 PHARMACY - PHYSICIAN COMMUNICATION CRITICAL VALUE ALERT - BLOOD CULTURE IDENTIFICATION (BCID)  Martin [name] is an 34 y.o. male who presented to [facility] on 04/20/2024 with a chief complaint of generalized body pain, shortness of breath, and chest pain that is consistent with his typical sickle cell pain crisis.   Assessment:  BCID: 1 of 2 bottles (each set has only 1 aerobic bottle) = gram positive rods. suspect contaminant. Rec no Rx   Name of physician (or Provider) Contacted: China Hollis, [name] via secure chate  Current antibiotics: none  Changes to prescribed antibiotics recommended:  Recommendations accepted by provider  Results for orders placed or performed during the hospital encounter of 03/03/24  Blood Culture ID Panel (Reflexed) (Collected: 03/08/2024 10:06 AM)  Result Value Ref Range   Enterococcus faecalis NOT DETECTED NOT DETECTED   Enterococcus Faecium NOT DETECTED NOT DETECTED   Listeria monocytogenes NOT DETECTED NOT DETECTED   Staphylococcus species NOT DETECTED NOT DETECTED   Staphylococcus aureus (BCID) NOT DETECTED NOT DETECTED   Staphylococcus epidermidis NOT DETECTED NOT DETECTED   Staphylococcus lugdunensis NOT DETECTED NOT DETECTED   Streptococcus species NOT DETECTED NOT DETECTED   Streptococcus agalactiae NOT DETECTED NOT DETECTED   Streptococcus pneumoniae NOT DETECTED NOT DETECTED   Streptococcus pyogenes NOT DETECTED NOT DETECTED   A.calcoaceticus-baumannii NOT DETECTED NOT DETECTED   Bacteroides fragilis NOT DETECTED NOT DETECTED   Enterobacterales NOT DETECTED NOT DETECTED   Enterobacter cloacae complex NOT DETECTED NOT DETECTED   Escherichia coli NOT DETECTED NOT DETECTED   Klebsiella aerogenes NOT DETECTED NOT DETECTED   Klebsiella oxytoca NOT DETECTED NOT DETECTED   Klebsiella pneumoniae NOT DETECTED NOT DETECTED   Proteus species NOT DETECTED NOT DETECTED   Salmonella species NOT DETECTED NOT DETECTED   Serratia marcescens NOT  DETECTED NOT DETECTED   Haemophilus influenzae NOT DETECTED NOT DETECTED   Neisseria meningitidis NOT DETECTED NOT DETECTED   Pseudomonas aeruginosa NOT DETECTED NOT DETECTED   Stenotrophomonas maltophilia NOT DETECTED NOT DETECTED   Candida albicans NOT DETECTED NOT DETECTED   Candida auris NOT DETECTED NOT DETECTED   Candida glabrata NOT DETECTED NOT DETECTED   Candida krusei NOT DETECTED NOT DETECTED   Candida parapsilosis NOT DETECTED NOT DETECTED   Candida tropicalis NOT DETECTED NOT DETECTED   Cryptococcus neoformans/gattii NOT DETECTED NOT DETECTED    Rosaline IVAR Edison, Pharm.D Use secure chat for questions 04/22/2024 12:43 PM

## 2024-04-23 LAB — CBC
HCT: 20.3 % — ABNORMAL LOW (ref 39.0–52.0)
Hemoglobin: 7.4 g/dL — ABNORMAL LOW (ref 13.0–17.0)
MCH: 33.8 pg (ref 26.0–34.0)
MCHC: 36.5 g/dL — ABNORMAL HIGH (ref 30.0–36.0)
MCV: 92.7 fL (ref 80.0–100.0)
Platelets: 262 K/uL (ref 150–400)
RBC: 2.19 MIL/uL — ABNORMAL LOW (ref 4.22–5.81)
RDW: 25.5 % — ABNORMAL HIGH (ref 11.5–15.5)
WBC: 15.2 K/uL — ABNORMAL HIGH (ref 4.0–10.5)
nRBC: 65.2 % — ABNORMAL HIGH (ref 0.0–0.2)

## 2024-04-23 LAB — PREPARE RBC (CROSSMATCH)

## 2024-04-23 MED ORDER — DIPHENHYDRAMINE HCL 50 MG/ML IJ SOLN
25.0000 mg | Freq: Once | INTRAMUSCULAR | Status: AC
  Administered 2024-04-23: 25 mg via INTRAVENOUS
  Filled 2024-04-23: qty 1

## 2024-04-23 MED ORDER — SODIUM CHLORIDE 0.9% IV SOLUTION: Freq: Once | INTRAVENOUS | Status: AC

## 2024-04-23 MED ORDER — ACETAMINOPHEN 325 MG PO TABS
650.0000 mg | ORAL_TABLET | Freq: Once | ORAL | Status: AC
  Administered 2024-04-23: 650 mg via ORAL
  Filled 2024-04-23: qty 2

## 2024-04-23 NOTE — Progress Notes (Signed)
 Subjective: Martin [name] is a 34 year old male with a medical history significant for sickle cell disease, chronic pain syndrome, HIV infection, history of pulmonary embolism on Eliquis , generalized anxiety, and anemia of chronic disease was admitted for sickle cell pain crisis. Patient endorses chest pain and shortness of breath.  He has an oxygen  requirement of 6 L.  Chest xray shows mild pulmonary edema.  Patient is also complaining of pain to low back and lower extremities.  Pain persists despite IV Dilaudid  PCA.  He denies any headache, dizziness, urinary symptoms, nausea, vomiting, or diarrhea.  Objective:  Vital signs in last 24 hours:  Vitals:   04/23/24 0537 04/23/24 0832 04/23/24 0835 04/23/24 0919  BP: 117/89     Pulse: (!) 110     Resp: 16 16    Temp: 97.6 F (36.4 C)     TempSrc: Oral     SpO2: 91%  92% 91%  Weight:      Height:        Intake/Output from previous day:   Intake/Output Summary (Last 24 hours) at 04/23/2024 1013 Last data filed at 04/23/2024 0549 Gross per 24 hour  Intake 274.58 ml  Output 1375 ml  Net -1100.42 ml    Physical Exam: General: Alert, awake, oriented x3. Ill appearing HEENT: [place]/AT PEERL, EOMI.  Scleral icterus Neck: Trachea midline,  no masses, no thyromegal,y no JVD, no carotid bruit OROPHARYNX:  Moist, No exudate/ erythema/lesions.  Heart: Regular rate and rhythm, murmur present Lungs: Dyspnea, tachypneic, using accessory muscles Abdomen: Soft, nontender, nondistended, positive bowel sounds, no masses no hepatosplenomegaly noted..  Neuro: No focal neurological deficits noted cranial nerves II through XII grossly intact. DTRs 2+ bilaterally upper and lower extremities. Strength 5 out of 5 in bilateral upper and lower extremities. Musculoskeletal: No warm swelling or erythema around joints, no spinal tenderness noted. Psychiatric: Patient alert and oriented x3, good insight and cognition, good recent to remote recall. Lymph node  survey: No cervical axillary or inguinal lymphadenopathy noted.  Lab Results:  Basic Metabolic Panel:    Component Value Date/Time   NA 142 04/20/2024 1310   NA 140 01/12/2023 1607   K 3.7 04/20/2024 1310   CL 110 04/20/2024 1310   CO2 25 04/20/2024 1310   BUN 6 04/20/2024 1310   BUN 8 01/12/2023 1607   CREATININE 0.85 04/20/2024 1310   GLUCOSE 82 04/20/2024 1310   CALCIUM  8.7 (L) 04/20/2024 1310   CBC:    Component Value Date/Time   WBC 15.2 (H) 04/23/2024 0707   HGB 7.4 (L) 04/23/2024 0707   HGB 7.4 (L) 01/12/2023 1607   HCT 20.3 (L) 04/23/2024 0707   HCT 21.7 (L) 01/12/2023 1607   PLT 262 04/23/2024 0707   PLT 321 01/12/2023 1607   MCV 92.7 04/23/2024 0707   MCV 94 01/12/2023 1607   NEUTROABS 5.2 04/20/2024 1310   NEUTROABS 3.8 01/12/2023 1607   LYMPHSABS 3.2 04/20/2024 1310   LYMPHSABS 5.6 (H) 01/12/2023 1607   MONOABS 1.0 04/20/2024 1310   EOSABS 0.1 04/20/2024 1310   EOSABS 0.2 01/12/2023 1607   BASOSABS 0.0 04/20/2024 1310   BASOSABS 0.1 01/12/2023 1607    Recent Results (from the past 240 hours)  Culture, blood (routine x 2)     Status: None (Preliminary result)   Collection Time: 04/20/24  8:08 PM   Specimen: BLOOD RIGHT HAND  Result Value Ref Range Status   Specimen Description   Final    BLOOD RIGHT HAND Performed  at [facility], [address], [place], [place] [address]    Special Requests   Final    BOTTLES DRAWN AEROBIC ONLY Blood Culture results may not be optimal due to an inadequate volume of blood received in culture bottles Performed at [facility], [address], [place], [place] [address]    Culture   Final    NO GROWTH 2 DAYS Performed at [facility], [address] [place], [place] [address]    Report Status PENDING  Incomplete  Culture, blood (routine x 2)     Status: None (Preliminary result)   Collection Time: 04/20/24  8:08 PM   Specimen: BLOOD LEFT HAND  Result Value Ref Range  Status   Specimen Description   Final    BLOOD LEFT HAND Performed at [facility], [address] [place], [place] [address]    Special Requests   Final    BOTTLES DRAWN AEROBIC ONLY Blood Culture results may not be optimal due to an inadequate volume of blood received in culture bottles Performed at [facility], [address] [place], [place] [address]    Culture  Setup Time   Final    GRAM POSITIVE RODS AEROBIC BOTTLE ONLY CRITICAL RESULT CALLED TO, READ BACK BY AND VERIFIED WITH: PHARMD M. BELL [id number] AT 1222, ADC Performed at [facility], [address], [place], [place] [address]    Culture GRAM POSITIVE RODS  Final   Report Status PENDING  Incomplete  Culture, blood (Routine X 2) w Reflex to ID Panel     Status: None (Preliminary result)   Collection Time: 04/22/24  1:16 PM   Specimen: BLOOD  Result Value Ref Range Status   Specimen Description   Final    BLOOD BLOOD RIGHT HAND Performed at [facility], [address] [place], [place] [address]    Special Requests   Final    BOTTLES DRAWN AEROBIC AND ANAEROBIC Blood Culture results may not be optimal due to an inadequate volume of blood received in culture bottles Performed at [facility], [address] [place], [place] [address]    Culture   Final    NO GROWTH < 24 HOURS Performed at [facility], [address] [place], [place] [address]    Report Status PENDING  Incomplete  Culture, blood (Routine X 2) w Reflex to ID Panel     Status: None (Preliminary result)   Collection Time: 04/22/24  1:17 PM   Specimen: BLOOD  Result Value Ref Range Status   Specimen Description   Final    BLOOD BLOOD RIGHT ARM Performed at [facility], [address], [place], [place] [address]    Special Requests   Final    BOTTLES DRAWN AEROBIC ONLY Blood Culture results may not be optimal due to an inadequate volume of  blood received in culture bottles Performed at [facility], [address] [place], [place] [address]    Culture   Final    NO GROWTH < 24 HOURS Performed at [facility], [address], [place], [place] [address]    Report Status PENDING  Incomplete    Studies/Results: DG CHEST PORT 1 VIEW Result Date: 04/22/2024 CLINICAL DATA:  Dyspnea EXAM: PORTABLE CHEST 1 VIEW COMPARISON:  Two days ago FINDINGS: Stable cardiomegaly. Right internal jugular Port-A-Cath is unchanged. Stable mild bibasilar interstitial densities concerning  for possible chronic scarring and possible superimposed edema. Bony thorax is unremarkable. IMPRESSION: Stable mild bibasilar interstitial densities as described above. Electronically Signed   By: Lynwood Landy Raddle M.D.   On: 04/22/2024 12:40    Medications: Scheduled Meds:  abacavir -dolutegravir -lamiVUDine   1 tablet Oral QHS   apixaban   5 mg Oral BID   folic acid   1 mg Oral Daily   HYDROmorphone    Intravenous Q4H   hydroxyurea   2,000 mg Oral QHS   senna-docusate  1 tablet Oral BID   sodium chloride  flush  10-40 mL Intracatheter Q12H   Continuous Infusions:  cefTRIAXone  (ROCEPHIN )  IV 2 g (04/23/24 0832)   PRN Meds:.albuterol , diphenhydrAMINE , lip balm, naloxone  **AND** sodium chloride  flush, ondansetron  (ZOFRAN ) IV, oxyCODONE -acetaminophen  **AND** oxyCODONE , polyethylene glycol, sodium chloride  flush  Consultants: None  Procedures: none  Antibiotics: Cephalexin    Assessment/Plan: Principal Problem:   Sickle cell anemia with crisis (HCC) Active Problems:   Generalized anxiety disorder   GERD (gastroesophageal reflux disease)   HIV (human immunodeficiency virus infection) (HCC)   Heart murmur   Chronic pain syndrome   History of pulmonary embolism   Acute on chronic respiratory failure with hypoxia (HCC)   Anemia of chronic disease   AKI (acute kidney injury)   Chronic hypoxic respiratory failure (HCC)  Sickle cell  disease with pain crisis: Continue IV Dilaudid  PCA without changes today Continue hydroxyurea  Monitor vital signs very closely, reevaluate pain scale regularly, and supplemental oxygen  as needed Repeat CBC and LDH in am.   Acute on chronic hypoxia with respiratory failure:  Patient is on chronic oxygen  therapy at home and requires 2-3 L/min for long standing low oxygen  levels. He currently has an oxygen  requirement of 6L/min. Continue supplemental oxygen , wean as tolerated. Oxygen  saturation 86% on 6L Repeat chest xray.   Anemia of chronic disease: Today, hemoglobin has decreased from 11.2 to 7.4. LDH markedly elevated. Transfuse 1 unit of PRBCs.  Follow labs in am.   Probable community acquired pneumonia versus acute chest syndrome: Patient continues to have an oxygen  requirement of 6 liters. On admission, chest xray showed mild pulmonary edema with underlying chronic coarsening interstitial markings and patchy air space opacities.  Continue IV antibiotics Transfuse 1 unit PRBCs today.    Will continue to monitor closely and consider simple exchange transfusion in am. Follow blood cultures Incentive spirometer  HIV: Continue antiviral medications.  Awaiting CD4 results.  Review previous labs, CD4 percent 29.2 patient states that he has been taking antiviral medications consistently and is followed by infectious disease.   Hyperbilirubinemia:  Total bilirubin markedly elevated at 13.5, which is increased from previous, secondary to hemolytic sickle cell crisis.  Continue to follow closely.   History of pulmonary embolism:  Continue Eliquis    Code Status: Full Code Family Communication: N/A Disposition Plan: Not yet ready for discharge   Bertram Georgina Bald  DNP, APRN, [name] [facility] [address] [place], [place] [address] [phone]  If 7PM-7AM, please contact night-coverage.  04/23/2024, 10:13 AM  LOS: 3 days

## 2024-04-23 NOTE — Plan of Care (Signed)
 Problem: Education: Goal: Knowledge of vaso-occlusive preventative measures will improve Outcome: Progressing Goal: Long-term complications will improve Outcome: Progressing   Problem: Self-Care: Goal: Ability to incorporate actions that prevent/reduce pain crisis will improve Outcome: Progressing   Problem: Bowel/Gastric: Goal: Gut motility will be maintained Outcome: Progressing   Problem: Tissue Perfusion: Goal: Complications related to inadequate tissue perfusion will be avoided or minimized Outcome: Progressing   Problem: Respiratory: Goal: Pulmonary complications will be avoided or minimized Outcome: Progressing   Problem: Fluid Volume: Goal: Ability to maintain a balanced intake and output will improve Outcome: Progressing   Problem: Sensory: Goal: Pain level will decrease with appropriate interventions Outcome: Progressing   Problem: Health Behavior: Goal: Postive changes in compliance with treatment and prescription regimens will improve Outcome: Progressing   Problem: Education: Goal: Knowledge of General Education information will improve Description: Including pain rating scale, medication(s)/side effects and non-pharmacologic comfort measures Outcome: Progressing   Problem: Health Behavior/Discharge Planning: Goal: Ability to manage health-related needs will improve Outcome: Progressing   Problem: Clinical Measurements: Goal: Ability to maintain clinical measurements within normal limits will improve Outcome: Progressing Goal: Will remain free from infection Outcome: Progressing Goal: Diagnostic test results will improve Outcome: Progressing Goal: Respiratory complications will improve Outcome: Progressing Goal: Cardiovascular complication will be avoided Outcome: Progressing   Problem: Activity: Goal: Risk for activity intolerance will decrease Outcome: Progressing   Problem: Nutrition: Goal: Adequate nutrition will be maintained Outcome:  Progressing   Problem: Coping: Goal: Level of anxiety will decrease Outcome: Progressing   Problem: Safety: Goal: Ability to remain free from injury will improve Outcome: Progressing   Problem: Skin Integrity: Goal: Risk for impaired skin integrity will decrease Outcome: Progressing

## 2024-04-23 NOTE — Plan of Care (Signed)
" °  Problem: Education: Goal: Knowledge of vaso-occlusive preventative measures will improve Outcome: Progressing   Problem: Respiratory: Goal: Pulmonary complications will be avoided or minimized Outcome: Progressing   Problem: Activity: Goal: Risk for activity intolerance will decrease Outcome: Progressing   Problem: Pain Managment: Goal: General experience of comfort will improve and/or be controlled Outcome: Progressing   Problem: Safety: Goal: Ability to remain free from injury will improve Outcome: Progressing   Problem: Skin Integrity: Goal: Risk for impaired skin integrity will decrease Outcome: Progressing   "

## 2024-04-24 ENCOUNTER — Ambulatory Visit (HOSPITAL_COMMUNITY): Admit: 2024-04-24 | Admitting: Cardiology

## 2024-04-24 LAB — CBC WITH DIFFERENTIAL/PLATELET
Abs Immature Granulocytes: 0.15 K/uL — ABNORMAL HIGH (ref 0.00–0.07)
Basophils Absolute: 0.1 K/uL (ref 0.0–0.1)
Basophils Relative: 0 %
Eosinophils Absolute: 0.2 K/uL (ref 0.0–0.5)
Eosinophils Relative: 1 %
HCT: 23 % — ABNORMAL LOW (ref 39.0–52.0)
Hemoglobin: 8.2 g/dL — ABNORMAL LOW (ref 13.0–17.0)
Immature Granulocytes: 1 %
Lymphocytes Relative: 29 %
Lymphs Abs: 4.5 K/uL — ABNORMAL HIGH (ref 0.7–4.0)
MCH: 32.9 pg (ref 26.0–34.0)
MCHC: 35.7 g/dL (ref 30.0–36.0)
MCV: 92.4 fL (ref 80.0–100.0)
Monocytes Absolute: 1.7 K/uL — ABNORMAL HIGH (ref 0.1–1.0)
Monocytes Relative: 11 %
Neutro Abs: 8.8 K/uL — ABNORMAL HIGH (ref 1.7–7.7)
Neutrophils Relative %: 58 %
Platelets: 286 K/uL (ref 150–400)
RBC: 2.49 MIL/uL — ABNORMAL LOW (ref 4.22–5.81)
RDW: 23.8 % — ABNORMAL HIGH (ref 11.5–15.5)
Smear Review: NORMAL
WBC: 15.2 K/uL — ABNORMAL HIGH (ref 4.0–10.5)
nRBC: 67.7 % — ABNORMAL HIGH (ref 0.0–0.2)

## 2024-04-24 LAB — COMPREHENSIVE METABOLIC PANEL WITH GFR
ALT: 138 U/L — ABNORMAL HIGH (ref 0–44)
AST: 183 U/L — ABNORMAL HIGH (ref 15–41)
Albumin: 3.6 g/dL (ref 3.5–5.0)
Alkaline Phosphatase: 81 U/L (ref 38–126)
Anion gap: 10 (ref 5–15)
BUN: 26 mg/dL — ABNORMAL HIGH (ref 6–20)
CO2: 22 mmol/L (ref 22–32)
Calcium: 8.5 mg/dL — ABNORMAL LOW (ref 8.9–10.3)
Chloride: 104 mmol/L (ref 98–111)
Creatinine, Ser: 1.14 mg/dL (ref 0.61–1.24)
GFR, Estimated: >60 mL/min
Glucose, Bld: 93 mg/dL (ref 70–99)
Potassium: 4.8 mmol/L (ref 3.5–5.1)
Sodium: 136 mmol/L (ref 135–145)
Total Bilirubin: 12.1 mg/dL — ABNORMAL HIGH (ref 0.0–1.2)
Total Protein: 7.4 g/dL (ref 6.5–8.1)

## 2024-04-24 LAB — BPAM RBC
Blood Product Expiration Date: 202602032359
ISSUE DATE / TIME: 202601041541
Unit Type and Rh: 5100

## 2024-04-24 LAB — T-HELPER CELLS (CD4) COUNT (NOT AT ARMC)
CD4 % Helper T Cell: 22 % — ABNORMAL LOW (ref 33–65)
CD4 T Cell Abs: 739 /uL (ref 400–1790)

## 2024-04-24 LAB — TYPE AND SCREEN
ABO/RH(D): O POS
Antibody Screen: NEGATIVE
Donor AG Type: NEGATIVE
Unit division: 0

## 2024-04-24 LAB — LACTATE DEHYDROGENASE: LDH: 895 U/L — ABNORMAL HIGH (ref 105–235)

## 2024-04-24 NOTE — Progress Notes (Addendum)
 Patient ID: Martin [name], male   DOB: May 24, 1990, 34 y.o.   MRN: [id number] Subjective: Martin [name] is a 34 year old male with a medical history significant for sickle cell disease, chronic pain syndrome, HIV infection, history of pulmonary embolism on Eliquis , generalized anxiety, and anemia of chronic disease was admitted for sickle cell pain crisis.   Patient sleeping at time of rounding.  Patient gently awaken, he continues to endorse unresolved pain of 9/10 in his chest and lower back.  He is saturating at 90% on 6 L supplemental oxygen .  Patient afebrile through the night, he denies any headache, dizziness, nausea, vomiting, or diarrhea.  No urinary symptoms.  Objective:  Vital signs in last 24 hours:  Vitals:   04/28/24 0846 04/28/24 0941 04/28/24 1037 04/28/24 1402  BP:   114/85 113/73  Pulse:   (!) 109 (!) 102  Resp: 16 18 16 16   Temp:   98.6 F (37 C) 98 F (36.7 C)  TempSrc:   Oral Oral  SpO2:   (!) 84% (!) 75%  Weight:      Height:        Intake/Output from previous day:   Intake/Output Summary (Last 24 hours) at 04/28/2024 1515 Last data filed at 04/28/2024 1402 Gross per 24 hour  Intake 10 ml  Output 1150 ml  Net -1140 ml    Physical Exam: General: Alert, awake, oriented x3, in no acute distress.  HEENT: [place]/AT PEERL, EOMI Neck: Trachea midline,  no masses, no thyromegal,y no JVD, no carotid bruit OROPHARYNX:  Moist, No exudate/ erythema/lesions.  Heart: Regular rate and rhythm, without murmurs, rubs, gallops, PMI non-displaced, no heaves or thrills on palpation.  Lungs: Clear to auscultation, no wheezing or rhonchi noted. No increased vocal fremitus resonant to percussion  Abdomen: Soft, nontender, nondistended, positive bowel sounds, no masses no hepatosplenomegaly noted..  Neuro: No focal neurological deficits noted cranial nerves II through XII grossly intact. DTRs 2+ bilaterally upper and lower extremities. Strength 5 out of 5 in bilateral  upper and lower extremities. Musculoskeletal: Lower back tenderness. Psychiatric: Patient alert and oriented x3, good insight and cognition, good recent to remote recall. Lymph node survey: No cervical axillary or inguinal lymphadenopathy noted.  Lab Results:  Basic Metabolic Panel:    Component Value Date/Time   NA 136 04/24/2024 1103   NA 140 01/12/2023 1607   K 4.8 04/24/2024 1103   CL 104 04/24/2024 1103   CO2 22 04/24/2024 1103   BUN 26 (H) 04/24/2024 1103   BUN 8 01/12/2023 1607   CREATININE 1.14 04/24/2024 1103   GLUCOSE 93 04/24/2024 1103   CALCIUM  8.5 (L) 04/24/2024 1103   CBC:    Component Value Date/Time   WBC 15.2 (H) 04/24/2024 0624   HGB 10.2 (L) 04/27/2024 0509   HGB 7.4 (L) 01/12/2023 1607   HCT 28.6 (L) 04/27/2024 0509   HCT 21.7 (L) 01/12/2023 1607   PLT 286 04/24/2024 0624   PLT 321 01/12/2023 1607   MCV 92.4 04/24/2024 0624   MCV 94 01/12/2023 1607   NEUTROABS 8.8 (H) 04/24/2024 0624   NEUTROABS 3.8 01/12/2023 1607   LYMPHSABS 4.5 (H) 04/24/2024 0624   LYMPHSABS 5.6 (H) 01/12/2023 1607   MONOABS 1.7 (H) 04/24/2024 0624   EOSABS 0.2 04/24/2024 0624   EOSABS 0.2 01/12/2023 1607   BASOSABS 0.1 04/24/2024 0624   BASOSABS 0.1 01/12/2023 1607    Recent Results (from the past 240 hours)  Culture, blood (routine x 2)  Status: Abnormal   Collection Time: 04/20/24  8:08 PM   Specimen: BLOOD RIGHT HAND  Result Value Ref Range Status   Specimen Description   Final    BLOOD RIGHT HAND Performed at [facility], [address] [place], [place] [address]    Special Requests   Final    BOTTLES DRAWN AEROBIC ONLY Blood Culture results may not be optimal due to an inadequate volume of blood received in culture bottles Performed at [facility], [address], [place], [place] [address]    Culture  Setup Time   Final    GRAM POSITIVE RODS AEROBIC BOTTLE ONLY CRITICAL RESULT CALLED TO, READ BACK BY AND VERIFIED  WITH: MITCHELL L  PHARM D 04/24/2024 BY DD @ 0440     Culture (A)  Final    CORYNEBACTERIUM, GROUP JK Standardized susceptibility testing for this organism is not available. Performed at [facility], [address] [place], [place] [address]    Report Status 04/28/2024 FINAL  Final  Culture, blood (routine x 2)     Status: Abnormal   Collection Time: 04/20/24  8:08 PM   Specimen: BLOOD LEFT HAND  Result Value Ref Range Status   Specimen Description   Final    BLOOD LEFT HAND Performed at [facility], [address], [place], [place] [address]    Special Requests   Final    BOTTLES DRAWN AEROBIC ONLY Blood Culture results may not be optimal due to an inadequate volume of blood received in culture bottles Performed at [facility], [address] [place], [place] [address]    Culture  Setup Time   Final    GRAM POSITIVE RODS AEROBIC BOTTLE ONLY CRITICAL RESULT CALLED TO, READ BACK BY AND VERIFIED WITH: PHARMD M. BELL [id number] AT 1222, ADC    Culture (A)  Final    CORYNEBACTERIUM, GROUP JK CORYNEBACTERIUM MINUTISSIMUM Standardized susceptibility testing for this organism is not available. Performed at [facility], [address] [place], [place] [address]    Report Status 04/26/2024 FINAL  Final  Culture, blood (Routine X 2) w Reflex to ID Panel     Status: None   Collection Time: 04/22/24  1:16 PM   Specimen: BLOOD RIGHT HAND  Result Value Ref Range Status   Specimen Description   Final    BLOOD RIGHT HAND Performed at [facility], [address], [place], [place] [address]    Special Requests   Final    BOTTLES DRAWN AEROBIC AND ANAEROBIC Blood Culture results may not be optimal due to an inadequate volume of blood received in culture bottles Performed at [facility], [address], [place], [place] [address]    Culture   Final    NO GROWTH 5 DAYS Performed at [facility], [address] [place], [place] [address]    Report Status 04/27/2024 FINAL  Final  Culture, blood (Routine X 2) w Reflex to ID Panel     Status: None   Collection Time: 04/22/24  1:17 PM   Specimen: BLOOD RIGHT ARM  Result Value Ref Range Status   Specimen Description   Final    BLOOD RIGHT ARM Performed at [facility], [address], [place], [place] [address]    Special Requests   Final    BOTTLES DRAWN AEROBIC ONLY Blood Culture results may not be optimal due to an  inadequate volume of blood received in culture bottles Performed at [facility], [address], [place], [place] [address]    Culture   Final    NO GROWTH 5 DAYS Performed at [facility], [address] [place], [place] [address]    Report Status 04/27/2024 FINAL  Final  Respiratory (~20 pathogens) panel by PCR     Status: None   Collection Time: 04/25/24  6:34 PM   Specimen: Nasopharyngeal Swab; Respiratory  Result Value Ref Range Status   Adenovirus NOT DETECTED NOT DETECTED Final   Coronavirus 229E NOT DETECTED NOT DETECTED Final    Comment: (NOTE) The Coronavirus on the Respiratory Panel, DOES NOT test for the novel  Coronavirus (2019 nCoV)    Coronavirus HKU1 NOT DETECTED NOT DETECTED Final   Coronavirus NL63 NOT DETECTED NOT DETECTED Final   Coronavirus OC43 NOT DETECTED NOT DETECTED Final   Metapneumovirus NOT DETECTED NOT DETECTED Final   Rhinovirus / Enterovirus NOT DETECTED NOT DETECTED Final   Influenza A NOT DETECTED NOT DETECTED Final   Influenza B NOT DETECTED NOT DETECTED Final   Parainfluenza Virus 1 NOT DETECTED NOT DETECTED Final   Parainfluenza Virus 2 NOT DETECTED NOT DETECTED Final   Parainfluenza Virus 3 NOT DETECTED NOT DETECTED Final   Parainfluenza Virus 4 NOT DETECTED NOT DETECTED Final   Respiratory Syncytial Virus NOT DETECTED NOT DETECTED Final   Bordetella pertussis NOT DETECTED NOT DETECTED Final   Bordetella Parapertussis NOT DETECTED NOT DETECTED Final    Chlamydophila pneumoniae NOT DETECTED NOT DETECTED Final   Mycoplasma pneumoniae NOT DETECTED NOT DETECTED Final    Comment: Performed at [facility], [address] [place], [place] [address]    Studies/Results: No results found.   Medications: Scheduled Meds:  sodium chloride    Intravenous Once   abacavir -dolutegravir -lamiVUDine   1 tablet Oral QHS   apixaban   5 mg Oral BID   Chlorhexidine  Gluconate Cloth  6 each Topical Daily   diphenhydrAMINE   25 mg Oral Once   folic acid   1 mg Oral Daily   hydroxyurea   2,000 mg Oral QHS   senna-docusate  1 tablet Oral BID   sodium chloride  flush  10-40 mL Intracatheter Q12H   sodium chloride  flush  10-40 mL Intracatheter Q12H   Continuous Infusions:   PRN Meds:.albuterol , lip balm, ondansetron , oxyCODONE -acetaminophen  **AND** oxyCODONE , polyethylene glycol, sodium chloride  flush, sodium chloride  flush  Consultants: None  Procedures: None  Antibiotics: Rocephin   Assessment/Plan: Principal Problem:   Sickle cell anemia with crisis (HCC) Active Problems:   Generalized anxiety disorder   GERD (gastroesophageal reflux disease)   HIV (human immunodeficiency virus infection) (HCC)   Heart murmur   Chronic pain syndrome   History of pulmonary embolism   Acute on chronic respiratory failure with hypoxia (HCC)   Anemia of chronic disease   AKI (acute kidney injury)   Chronic hypoxic respiratory failure (HCC)    Sickle Cell Disease with Pain crisis:  Pain not improving continue IVF 0.45% saline at KVO, continue weight based Dilaudid  PCA at current dose, continue oral home pain medications as ordered. Monitor vitals very closely, Re-evaluate pain scale regularly, 2 L of Oxygen  by [place]. Patient encouraged to ambulate on the hallway today.  LDH/a.m. labs pending.  Probably community: Acquired pneumonia versus acute chest syndrome: Continue supplemental oxygen  therapy at 6 L.  Continue antibiotics as prescribed.  Leukocytosis:  WBC elevated, gradually improving  Anemia of Chronic Disease: Hemoglobin within patient's baseline, no  clinical indication for blood transfusion, LDH elevated.  Will continue to monitor labs in the AM.   Chronic pain Syndrome: Continue oral home pain medication.  HIV-continue antiviral medications. CD4 counts pending lab result.  Followed by infectious disease.  Hyperbilirubinemia: Total bilirubin elevated with prior labs.  A.m. labs pending will continue to monitor.   History of pulmonary embolism: Continue Eliquis  as prescribed.     Code Status: Full Code Family Communication: N/A Disposition Plan: Not yet ready for discharge  [name] Cover NP  If 7PM-7AM, please contact night-coverage.  04/28/2024, 3:15 PM  LOS: 8 days

## 2024-04-24 NOTE — Plan of Care (Signed)
  Problem: Nutrition: Goal: Adequate nutrition will be maintained Outcome: Progressing   Problem: Pain Managment: Goal: General experience of comfort will improve and/or be controlled Outcome: Progressing   Problem: Skin Integrity: Goal: Risk for impaired skin integrity will decrease Outcome: Progressing

## 2024-04-25 ENCOUNTER — Telehealth: Payer: Self-pay

## 2024-04-25 LAB — PREPARE RBC (CROSSMATCH)

## 2024-04-25 LAB — RESPIRATORY PANEL BY PCR

## 2024-04-25 MED ORDER — DIPHENHYDRAMINE HCL 50 MG/ML IJ SOLN
25.0000 mg | Freq: Once | INTRAMUSCULAR | Status: AC
  Administered 2024-04-25: 25 mg via INTRAVENOUS
  Filled 2024-04-25: qty 1

## 2024-04-25 MED ORDER — DIPHENHYDRAMINE HCL 25 MG PO CAPS: 25.0000 mg | ORAL_CAPSULE | Freq: Once | ORAL | Status: DC

## 2024-04-25 MED ORDER — SODIUM CHLORIDE 0.9% IV SOLUTION: Freq: Once | INTRAVENOUS | Status: AC

## 2024-04-25 MED ORDER — ACETAMINOPHEN 325 MG PO TABS
650.0000 mg | ORAL_TABLET | Freq: Once | ORAL | Status: AC
  Administered 2024-04-25: 650 mg via ORAL
  Filled 2024-04-25: qty 2

## 2024-04-25 NOTE — Plan of Care (Signed)

## 2024-04-25 NOTE — Plan of Care (Signed)
  Problem: Education: Goal: Knowledge of vaso-occlusive preventative measures will improve Outcome: Progressing Goal: Awareness of infection prevention will improve Outcome: Progressing Goal: Awareness of signs and symptoms of anemia will improve Outcome: Progressing

## 2024-04-25 NOTE — Progress Notes (Signed)
 Complex Care Management Note  Care Guide Note 04/25/2024 Name: Travares Nelles MRN: [id number] DOB: November 01, 1990  Duquan Kenderick Kobler is a 34 y.o. year old male who sees [name], [name], [name] for primary care. I reached out to [employer] by phone today to offer complex care management services.  Mr. Kaps was given information about Complex Care Management services today including:   The Complex Care Management services include support from the care team which includes your Nurse Care Manager, Clinical Social Worker, or Pharmacist.  The Complex Care Management team is here to help remove barriers to the health concerns and goals most important to you. Complex Care Management services are voluntary, and the patient may decline or stop services at any time by request to their care team member.   Complex Care Management Consent Status: Patient agreed to services and verbal consent obtained.   Follow up plan:  Telephone appointment with complex care management team member scheduled for:  05/02/24 @ 1 PM  Encounter Outcome:  Patient Scheduled  Leotis Rase [facility], [facility] Guide  Direct Dial: [phone]  Fax [phone]

## 2024-04-25 NOTE — Progress Notes (Addendum)
 Patient ID: Martin [name], male   DOB: 1990/08/30, 34 y.o.   MRN: [id number] Subjective: Martin [name] is a 34 year old male with a medical history significant for sickle cell disease, chronic pain syndrome, HIV infection, history of pulmonary embolism on Eliquis , generalized anxiety, and anemia of chronic disease was admitted for sickle cell pain crisis.   Patient is endorsing pain of 10/10. afebrile through the night, CD4 counts 22%, completed consultation to infectious disease.  Patient is currently on antiviral.  Endorsing slight cough and congestion.  Completed respiratory panel results pending.  Patient reporting new oliguria he denies any headache, dizziness, nausea, vomiting, or diarrhea.   Objective:  Vital signs in last 24 hours:  Vitals:   04/28/24 0846 04/28/24 0941 04/28/24 1037 04/28/24 1402  BP:   114/85 113/73  Pulse:   (!) 109 (!) 102  Resp: 16 18 16 16   Temp:   98.6 F (37 C) 98 F (36.7 C)  TempSrc:   Oral Oral  SpO2:   (!) 84% (!) 75%  Weight:      Height:        Intake/Output from previous day:   Intake/Output Summary (Last 24 hours) at 04/28/2024 1505 Last data filed at 04/28/2024 1402 Gross per 24 hour  Intake 10 ml  Output 1150 ml  Net -1140 ml    Physical Exam: General: Alert, awake, oriented x3, in no acute distress.  HEENT: [place]/AT PEERL, EOMI Neck: Trachea midline,  no masses, no thyromegal,y no JVD, no carotid bruit OROPHARYNX:  Moist, No exudate/ erythema/lesions.  Heart: Regular rate and rhythm, without murmurs, rubs, gallops, PMI non-displaced, no heaves or thrills on palpation.  Lungs: Clear to auscultation, no wheezing or rhonchi noted. No increased vocal fremitus resonant to percussion  Abdomen: Soft, nontender, nondistended, positive bowel sounds, no masses no hepatosplenomegaly noted..  Neuro: No focal neurological deficits noted cranial nerves II through XII grossly intact. DTRs 2+ bilaterally upper and lower extremities.  Strength 5 out of 5 in bilateral upper and lower extremities. Musculoskeletal: Lower back tenderness. Psychiatric: Patient alert and oriented x3, good insight and cognition, good recent to remote recall. Lymph node survey: No cervical axillary or inguinal lymphadenopathy noted.  Lab Results:  Basic Metabolic Panel:    Component Value Date/Time   NA 136 04/24/2024 1103   NA 140 01/12/2023 1607   K 4.8 04/24/2024 1103   CL 104 04/24/2024 1103   CO2 22 04/24/2024 1103   BUN 26 (H) 04/24/2024 1103   BUN 8 01/12/2023 1607   CREATININE 1.14 04/24/2024 1103   GLUCOSE 93 04/24/2024 1103   CALCIUM  8.5 (L) 04/24/2024 1103   CBC:    Component Value Date/Time   WBC 15.2 (H) 04/24/2024 0624   HGB 10.2 (L) 04/27/2024 0509   HGB 7.4 (L) 01/12/2023 1607   HCT 28.6 (L) 04/27/2024 0509   HCT 21.7 (L) 01/12/2023 1607   PLT 286 04/24/2024 0624   PLT 321 01/12/2023 1607   MCV 92.4 04/24/2024 0624   MCV 94 01/12/2023 1607   NEUTROABS 8.8 (H) 04/24/2024 0624   NEUTROABS 3.8 01/12/2023 1607   LYMPHSABS 4.5 (H) 04/24/2024 0624   LYMPHSABS 5.6 (H) 01/12/2023 1607   MONOABS 1.7 (H) 04/24/2024 0624   EOSABS 0.2 04/24/2024 0624   EOSABS 0.2 01/12/2023 1607   BASOSABS 0.1 04/24/2024 0624   BASOSABS 0.1 01/12/2023 1607    Recent Results (from the past 240 hours)  Culture, blood (routine x 2)     Status:  Abnormal   Collection Time: 04/20/24  8:08 PM   Specimen: BLOOD RIGHT HAND  Result Value Ref Range Status   Specimen Description   Final    BLOOD RIGHT HAND Performed at [facility], [address] [place], [place] [address]    Special Requests   Final    BOTTLES DRAWN AEROBIC ONLY Blood Culture results may not be optimal due to an inadequate volume of blood received in culture bottles Performed at [facility], [address] [place], [place] [address]    Culture  Setup Time   Final    GRAM POSITIVE RODS AEROBIC BOTTLE ONLY CRITICAL RESULT CALLED  TO, READ BACK BY AND VERIFIED WITH: MITCHELL L  PHARM D 04/24/2024 BY DD @ 0440     Culture (A)  Final    CORYNEBACTERIUM, GROUP JK Standardized susceptibility testing for this organism is not available. Performed at [facility], [address], [place], [place] [address]    Report Status 04/28/2024 FINAL  Final  Culture, blood (routine x 2)     Status: Abnormal   Collection Time: 04/20/24  8:08 PM   Specimen: BLOOD LEFT HAND  Result Value Ref Range Status   Specimen Description   Final    BLOOD LEFT HAND Performed at [facility], [address] [place], [place] [address]    Special Requests   Final    BOTTLES DRAWN AEROBIC ONLY Blood Culture results may not be optimal due to an inadequate volume of blood received in culture bottles Performed at [facility], [address] [place], [place] [address]    Culture  Setup Time   Final    GRAM POSITIVE RODS AEROBIC BOTTLE ONLY CRITICAL RESULT CALLED TO, READ BACK BY AND VERIFIED WITH: PHARMD M. BELL [id number] AT 1222, ADC    Culture (A)  Final    CORYNEBACTERIUM, GROUP JK CORYNEBACTERIUM MINUTISSIMUM Standardized susceptibility testing for this organism is not available. Performed at [facility], [address] [place], [place] [address]    Report Status 04/26/2024 FINAL  Final  Culture, blood (Routine X 2) w Reflex to ID Panel     Status: None   Collection Time: 04/22/24  1:16 PM   Specimen: BLOOD RIGHT HAND  Result Value Ref Range Status   Specimen Description   Final    BLOOD RIGHT HAND Performed at [facility], [address], [place], [place] [address]    Special Requests   Final    BOTTLES DRAWN AEROBIC AND ANAEROBIC Blood Culture results may not be optimal due to an inadequate volume of blood received in culture bottles Performed at [facility], [address] [place], [place] [address]    Culture   Final    NO GROWTH 5 DAYS Performed at  [facility], [address], [place], [place] [address]    Report Status 04/27/2024 FINAL  Final  Culture, blood (Routine X 2) w Reflex to ID Panel     Status: None   Collection Time: 04/22/24  1:17 PM   Specimen: BLOOD RIGHT ARM  Result Value Ref Range Status   Specimen Description   Final    BLOOD RIGHT ARM Performed at [facility], [address] [place], [place] [address]    Special Requests   Final    BOTTLES DRAWN AEROBIC ONLY Blood Culture results may not be optimal due to an inadequate  volume of blood received in culture bottles Performed at [facility], [address] [place], [place] [address]    Culture   Final    NO GROWTH 5 DAYS Performed at [facility], [address], [place], [place] [address]    Report Status 04/27/2024 FINAL  Final  Respiratory (~20 pathogens) panel by PCR     Status: None   Collection Time: 04/25/24  6:34 PM   Specimen: Nasopharyngeal Swab; Respiratory  Result Value Ref Range Status   Adenovirus NOT DETECTED NOT DETECTED Final   Coronavirus 229E NOT DETECTED NOT DETECTED Final    Comment: (NOTE) The Coronavirus on the Respiratory Panel, DOES NOT test for the novel  Coronavirus (2019 nCoV)    Coronavirus HKU1 NOT DETECTED NOT DETECTED Final   Coronavirus NL63 NOT DETECTED NOT DETECTED Final   Coronavirus OC43 NOT DETECTED NOT DETECTED Final   Metapneumovirus NOT DETECTED NOT DETECTED Final   Rhinovirus / Enterovirus NOT DETECTED NOT DETECTED Final   Influenza A NOT DETECTED NOT DETECTED Final   Influenza B NOT DETECTED NOT DETECTED Final   Parainfluenza Virus 1 NOT DETECTED NOT DETECTED Final   Parainfluenza Virus 2 NOT DETECTED NOT DETECTED Final   Parainfluenza Virus 3 NOT DETECTED NOT DETECTED Final   Parainfluenza Virus 4 NOT DETECTED NOT DETECTED Final   Respiratory Syncytial Virus NOT DETECTED NOT DETECTED Final   Bordetella pertussis NOT DETECTED NOT DETECTED Final   Bordetella  Parapertussis NOT DETECTED NOT DETECTED Final   Chlamydophila pneumoniae NOT DETECTED NOT DETECTED Final   Mycoplasma pneumoniae NOT DETECTED NOT DETECTED Final    Comment: Performed at [facility], [address], [place], [place] [address]    Studies/Results: No results found.   Medications: Scheduled Meds:  sodium chloride    Intravenous Once   abacavir -dolutegravir -lamiVUDine   1 tablet Oral QHS   apixaban   5 mg Oral BID   Chlorhexidine  Gluconate Cloth  6 each Topical Daily   diphenhydrAMINE   25 mg Oral Once   folic acid   1 mg Oral Daily   hydroxyurea   2,000 mg Oral QHS   senna-docusate  1 tablet Oral BID   sodium chloride  flush  10-40 mL Intracatheter Q12H   sodium chloride  flush  10-40 mL Intracatheter Q12H   Continuous Infusions:   PRN Meds:.albuterol , lip balm, ondansetron , oxyCODONE -acetaminophen  **AND** oxyCODONE , polyethylene glycol, sodium chloride  flush, sodium chloride  flush  Consultants: None  Procedures: None  Antibiotics: Rocephin   Assessment/Plan: Principal Problem:   Sickle cell anemia with crisis (HCC) Active Problems:   Generalized anxiety disorder   GERD (gastroesophageal reflux disease)   HIV (human immunodeficiency virus infection) (HCC)   Heart murmur   Chronic pain syndrome   History of pulmonary embolism   Acute on chronic respiratory failure with hypoxia (HCC)   Anemia of chronic disease   AKI (acute kidney injury)   Chronic hypoxic respiratory failure (HCC)    Sickle Cell Disease with Pain crisis:  Pain not improving continue IVF 0.45% saline at 75 mL/h continue weight based Dilaudid  PCA at current dose, continue oral home pain medications as ordered. Monitor vitals very closely, Re-evaluate pain scale regularly, 2 L of Oxygen  by [place]. Patient encouraged to ambulate on the hallway today.  LDH/a.m. labs pending.  Probably community: Acquired pneumonia versus acute chest syndrome: Continue supplemental oxygen  therapy at 6 L.   Continue antibiotics as prescribed.  Leukocytosis: WBC elevated, gradually improving  Anemia of Chronic Disease: LDH elevated. Complete therapeutic phlebotomy,  remove 700 cc and transfuse 2 units packed red blood cells.  monitor labs in the AM.   Chronic pain Syndrome: Continue oral home pain medication.  HIV: CD4 count at 22% , referral to infectious disease completed to reassess current antiviral medication.   Hyperbilirubinemia: Total bilirubin elevated with prior labs.  A.m. labs pending will continue to monitor. Discontinue all nephrotoxic medication, exchange transfusion pending.   History of pulmonary embolism: Continue Eliquis  as prescribed.     Code Status: Full Code Family Communication: N/A Disposition Plan: Not yet ready for discharge  [name] Cover NP  If 7PM-7AM, please contact night-coverage.  04/28/2024, 3:05 PM  LOS: 8 days

## 2024-04-26 LAB — HEMOGLOBIN AND HEMATOCRIT, BLOOD
HCT: 27.1 % — ABNORMAL LOW (ref 39.0–52.0)
Hemoglobin: 10 g/dL — ABNORMAL LOW (ref 13.0–17.0)

## 2024-04-26 LAB — PREPARE RBC (CROSSMATCH)

## 2024-04-26 LAB — CULTURE, BLOOD (ROUTINE X 2): Culture  Setup Time: NO GROWTH

## 2024-04-26 LAB — LACTATE DEHYDROGENASE: LDH: 969 U/L — ABNORMAL HIGH (ref 105–235)

## 2024-04-26 MED ORDER — SODIUM CHLORIDE 0.9% IV SOLUTION: Freq: Once | INTRAVENOUS | Status: DC

## 2024-04-26 MED ORDER — SODIUM CHLORIDE 0.9% FLUSH: 10.0000 mL | INTRAVENOUS | Status: AC | PRN

## 2024-04-26 MED ORDER — DIPHENHYDRAMINE HCL 50 MG/ML IJ SOLN
25.0000 mg | Freq: Once | INTRAMUSCULAR | Status: AC
  Administered 2024-04-26: 25 mg via INTRAVENOUS
  Filled 2024-04-26: qty 1

## 2024-04-26 MED ORDER — CHLORHEXIDINE GLUCONATE CLOTH 2 % EX PADS
6.0000 | MEDICATED_PAD | Freq: Every day | CUTANEOUS | Status: DC
  Administered 2024-04-26 – 2024-04-29 (×3): 6 via TOPICAL

## 2024-04-26 MED ORDER — SODIUM CHLORIDE 0.45 % IV SOLN: INTRAVENOUS | Status: AC

## 2024-04-26 MED ORDER — SODIUM CHLORIDE 0.9% FLUSH
10.0000 mL | Freq: Two times a day (BID) | INTRAVENOUS | Status: AC
  Administered 2024-04-27 – 2024-05-01 (×8): 10 mL

## 2024-04-26 NOTE — Progress Notes (Addendum)
 Patient ID: Greco Gastelum, male   DOB: 09/16/1990, 34 y.o.   MRN: [id number] Subjective: Martin [name] is a 34 year old male with a medical history significant for sickle cell disease, chronic pain syndrome, HIV infection, history of pulmonary embolism on Eliquis , generalized anxiety, and anemia of chronic disease was admitted for sickle cell pain crisis.   Patient is endorsing pain of 8/10.  He is more alert today.  Patient afebrile through the night.  Blood transfusions completed however therapeutic transfusion was not completed.  Respiratory panel negative for flu, improved urinary output.  Patient has no new concerns today.  Denies headache, nausea, dizziness, vomiting.  No urinary symptoms.       Objective:  Vital signs in last 24 hours:  Vitals:   04/28/24 0846 04/28/24 0941 04/28/24 1037 04/28/24 1402  BP:   114/85 113/73  Pulse:   (!) 109 (!) 102  Resp: 16 18 16 16   Temp:   98.6 F (37 C) 98 F (36.7 C)  TempSrc:   Oral Oral  SpO2:   (!) 84% (!) 75%  Weight:      Height:        Intake/Output from previous day:   Intake/Output Summary (Last 24 hours) at 04/28/2024 1504 Last data filed at 04/28/2024 1402 Gross per 24 hour  Intake 10 ml  Output 1150 ml  Net -1140 ml    Physical Exam: General: Alert, awake, oriented x3, in no acute distress.  HEENT: [place]/AT PEERL, EOMI Neck: Trachea midline,  no masses, no thyromegal,y no JVD, no carotid bruit OROPHARYNX:  Moist, No exudate/ erythema/lesions.  Heart: Regular rate and rhythm, without murmurs, rubs, gallops, PMI non-displaced, no heaves or thrills on palpation.  Lungs: Clear to auscultation, no wheezing or rhonchi noted. No increased vocal fremitus resonant to percussion  Abdomen: Soft, nontender, nondistended, positive bowel sounds, no masses no hepatosplenomegaly noted..  Neuro: No focal neurological deficits noted cranial nerves II through XII grossly intact. DTRs 2+ bilaterally upper and lower extremities.  Strength 5 out of 5 in bilateral upper and lower extremities. Musculoskeletal: Lower back tenderness. Psychiatric: Patient alert and oriented x3, good insight and cognition, good recent to remote recall. Lymph node survey: No cervical axillary or inguinal lymphadenopathy noted.  Lab Results:  Basic Metabolic Panel:    Component Value Date/Time   NA 136 04/24/2024 1103   NA 140 01/12/2023 1607   K 4.8 04/24/2024 1103   CL 104 04/24/2024 1103   CO2 22 04/24/2024 1103   BUN 26 (H) 04/24/2024 1103   BUN 8 01/12/2023 1607   CREATININE 1.14 04/24/2024 1103   GLUCOSE 93 04/24/2024 1103   CALCIUM  8.5 (L) 04/24/2024 1103   CBC:    Component Value Date/Time   WBC 15.2 (H) 04/24/2024 0624   HGB 10.2 (L) 04/27/2024 0509   HGB 7.4 (L) 01/12/2023 1607   HCT 28.6 (L) 04/27/2024 0509   HCT 21.7 (L) 01/12/2023 1607   PLT 286 04/24/2024 0624   PLT 321 01/12/2023 1607   MCV 92.4 04/24/2024 0624   MCV 94 01/12/2023 1607   NEUTROABS 8.8 (H) 04/24/2024 0624   NEUTROABS 3.8 01/12/2023 1607   LYMPHSABS 4.5 (H) 04/24/2024 0624   LYMPHSABS 5.6 (H) 01/12/2023 1607   MONOABS 1.7 (H) 04/24/2024 0624   EOSABS 0.2 04/24/2024 0624   EOSABS 0.2 01/12/2023 1607   BASOSABS 0.1 04/24/2024 0624   BASOSABS 0.1 01/12/2023 1607    Recent Results (from the past 240 hours)  Culture, blood (  routine x 2)     Status: Abnormal   Collection Time: 04/20/24  8:08 PM   Specimen: BLOOD RIGHT HAND  Result Value Ref Range Status   Specimen Description   Final    BLOOD RIGHT HAND Performed at [facility], [address], [place], [place] [address]    Special Requests   Final    BOTTLES DRAWN AEROBIC ONLY Blood Culture results may not be optimal due to an inadequate volume of blood received in culture bottles Performed at [facility], [address] [place], [place] [address]    Culture  Setup Time   Final    GRAM POSITIVE RODS AEROBIC BOTTLE ONLY CRITICAL RESULT CALLED  TO, READ BACK BY AND VERIFIED WITH: MITCHELL L  PHARM D 04/24/2024 BY DD @ 0440     Culture (A)  Final    CORYNEBACTERIUM, GROUP JK Standardized susceptibility testing for this organism is not available. Performed at [facility], [address], [place], [place] [address]    Report Status 04/28/2024 FINAL  Final  Culture, blood (routine x 2)     Status: Abnormal   Collection Time: 04/20/24  8:08 PM   Specimen: BLOOD LEFT HAND  Result Value Ref Range Status   Specimen Description   Final    BLOOD LEFT HAND Performed at [facility], [address], [place], [place] [address]    Special Requests   Final    BOTTLES DRAWN AEROBIC ONLY Blood Culture results may not be optimal due to an inadequate volume of blood received in culture bottles Performed at [facility], [address], [place], [place] [address]    Culture  Setup Time   Final    GRAM POSITIVE RODS AEROBIC BOTTLE ONLY CRITICAL RESULT CALLED TO, READ BACK BY AND VERIFIED WITH: PHARMD M. BELL [id number] AT 1222, ADC    Culture (A)  Final    CORYNEBACTERIUM, GROUP JK CORYNEBACTERIUM MINUTISSIMUM Standardized susceptibility testing for this organism is not available. Performed at [facility], [address] [place], [place] [address]    Report Status 04/26/2024 FINAL  Final  Culture, blood (Routine X 2) w Reflex to ID Panel     Status: None   Collection Time: 04/22/24  1:16 PM   Specimen: BLOOD RIGHT HAND  Result Value Ref Range Status   Specimen Description   Final    BLOOD RIGHT HAND Performed at [facility], [address] [place], [place] [address]    Special Requests   Final    BOTTLES DRAWN AEROBIC AND ANAEROBIC Blood Culture results may not be optimal due to an inadequate volume of blood received in culture bottles Performed at [facility], [address] [place], [place] [address]    Culture   Final    NO GROWTH 5 DAYS Performed at  [facility], [address], [place], [place] [address]    Report Status 04/27/2024 FINAL  Final  Culture, blood (Routine X 2) w Reflex to ID Panel     Status: None   Collection Time: 04/22/24  1:17 PM   Specimen: BLOOD RIGHT ARM  Result Value Ref Range Status   Specimen Description   Final    BLOOD RIGHT ARM Performed at [facility], [address], [place], [place] [address]    Special Requests   Final    BOTTLES DRAWN AEROBIC ONLY Blood Culture results  may not be optimal due to an inadequate volume of blood received in culture bottles Performed at [facility], [address], [place], [place] [address]    Culture   Final    NO GROWTH 5 DAYS Performed at [facility], [address], [place], [place] [address]    Report Status 04/27/2024 FINAL  Final  Respiratory (~20 pathogens) panel by PCR     Status: None   Collection Time: 04/25/24  6:34 PM   Specimen: Nasopharyngeal Swab; Respiratory  Result Value Ref Range Status   Adenovirus NOT DETECTED NOT DETECTED Final   Coronavirus 229E NOT DETECTED NOT DETECTED Final    Comment: (NOTE) The Coronavirus on the Respiratory Panel, DOES NOT test for the novel  Coronavirus (2019 nCoV)    Coronavirus HKU1 NOT DETECTED NOT DETECTED Final   Coronavirus NL63 NOT DETECTED NOT DETECTED Final   Coronavirus OC43 NOT DETECTED NOT DETECTED Final   Metapneumovirus NOT DETECTED NOT DETECTED Final   Rhinovirus / Enterovirus NOT DETECTED NOT DETECTED Final   Influenza A NOT DETECTED NOT DETECTED Final   Influenza B NOT DETECTED NOT DETECTED Final   Parainfluenza Virus 1 NOT DETECTED NOT DETECTED Final   Parainfluenza Virus 2 NOT DETECTED NOT DETECTED Final   Parainfluenza Virus 3 NOT DETECTED NOT DETECTED Final   Parainfluenza Virus 4 NOT DETECTED NOT DETECTED Final   Respiratory Syncytial Virus NOT DETECTED NOT DETECTED Final   Bordetella pertussis NOT DETECTED NOT DETECTED Final   Bordetella  Parapertussis NOT DETECTED NOT DETECTED Final   Chlamydophila pneumoniae NOT DETECTED NOT DETECTED Final   Mycoplasma pneumoniae NOT DETECTED NOT DETECTED Final    Comment: Performed at [facility], [address], [place], [place] [address]    Studies/Results: No results found.   Medications: Scheduled Meds:  sodium chloride    Intravenous Once   abacavir -dolutegravir -lamiVUDine   1 tablet Oral QHS   apixaban   5 mg Oral BID   Chlorhexidine  Gluconate Cloth  6 each Topical Daily   diphenhydrAMINE   25 mg Oral Once   folic acid   1 mg Oral Daily   hydroxyurea   2,000 mg Oral QHS   senna-docusate  1 tablet Oral BID   sodium chloride  flush  10-40 mL Intracatheter Q12H   sodium chloride  flush  10-40 mL Intracatheter Q12H   Continuous Infusions:   PRN Meds:.albuterol , lip balm, ondansetron , oxyCODONE -acetaminophen  **AND** oxyCODONE , polyethylene glycol, sodium chloride  flush, sodium chloride  flush  Consultants: Infectious disease  Procedures: Blood exchange transfusion  Antibiotics: Rocephin   Assessment/Plan: Principal Problem:   Sickle cell anemia with crisis (HCC) Active Problems:   Generalized anxiety disorder   GERD (gastroesophageal reflux disease)   HIV (human immunodeficiency virus infection) (HCC)   Heart murmur   Chronic pain syndrome   History of pulmonary embolism   Acute on chronic respiratory failure with hypoxia (HCC)   Anemia of chronic disease   AKI (acute kidney injury)   Chronic hypoxic respiratory failure (HCC)    Sickle Cell Disease with Pain crisis:  Pain not improving continue IVF 0.45% saline at 75 mL/h continue weight based Dilaudid  PCA at current dose, continue oral home pain medications as ordered. Monitor vitals very closely, Re-evaluate pain scale regularly, 2 L of Oxygen  by [place]. Patient encouraged to ambulate on the hallway today.  LDH/a.m. labs pending.  Probably community: Acquired pneumonia versus acute chest syndrome: Continue  supplemental oxygen  therapy at 6 L.  Continue antibiotics as prescribed.  Leukocytosis: WBC elevated, gradually  improving.  Anemia of Chronic Disease: LDH elevated. therapeutic phlebotomy orders in place, remove 700 cc and transfuse 1 unit packed red blood cells.  Exchange transfusion was not completed yesterday as ordered.  Monitor labs in the AM.   Chronic pain Syndrome: Continue oral home pain medication.  HIV: CD4 count at 22% , referral to infectious disease completed to reassess current antiviral medication.   Hyperbilirubinemia: Total bilirubin elevated with prior labs, however gradually improving.discontinue all nephrotoxic medication.   History of pulmonary embolism: Continue Eliquis  as prescribed.     Code Status: Full Code Family Communication: N/A Disposition Plan: Not yet ready for discharge  [name] Cover NP  If 7PM-7AM, please contact night-coverage.  04/26/2024, 3:04 PM  LOS: 8 days

## 2024-04-26 NOTE — Progress Notes (Signed)
 Therapeutic phlebotomy performed per order. Entire 700cc blood draw tolerated. RN notified as soon as procedure was finished. Pt had no complaints during procedure

## 2024-04-26 NOTE — Plan of Care (Signed)
  Problem: Clinical Measurements: Goal: Diagnostic test results will improve Outcome: Progressing   Problem: Activity: Goal: Risk for activity intolerance will decrease Outcome: Progressing   Problem: Elimination: Goal: Will not experience complications related to bowel motility Outcome: Progressing Goal: Will not experience complications related to urinary retention Outcome: Progressing   Problem: Pain Managment: Goal: General experience of comfort will improve and/or be controlled Outcome: Progressing   Problem: Safety: Goal: Ability to remain free from injury will improve Outcome: Progressing

## 2024-04-26 NOTE — Plan of Care (Signed)
" °  Problem: Education: Goal: Awareness of infection prevention will improve Outcome: Progressing Goal: Awareness of signs and symptoms of anemia will improve Outcome: Progressing   Problem: Self-Care: Goal: Ability to incorporate actions that prevent/reduce pain crisis will improve Outcome: Progressing   Problem: Tissue Perfusion: Goal: Complications related to inadequate tissue perfusion will be avoided or minimized Outcome: Progressing   Problem: Respiratory: Goal: Acute Chest Syndrome will be identified early to prevent complications Outcome: Progressing   Problem: Sensory: Goal: Pain level will decrease with appropriate interventions Outcome: Progressing   Problem: Health Behavior: Goal: Postive changes in compliance with treatment and prescription regimens will improve Outcome: Progressing   Problem: Education: Goal: Knowledge of General Education information will improve Description: Including pain rating scale, medication(s)/side effects and non-pharmacologic comfort measures Outcome: Progressing   Problem: Health Behavior/Discharge Planning: Goal: Ability to manage health-related needs will improve Outcome: Progressing   "

## 2024-04-27 LAB — BPAM RBC
Blood Product Expiration Date: 202601212359
Blood Product Expiration Date: 202601282359
Blood Product Expiration Date: 202602082359
Blood Product Expiration Date: 202602092359
ISSUE DATE / TIME: 202601061432
ISSUE DATE / TIME: 202601061702
ISSUE DATE / TIME: 202601062120
ISSUE DATE / TIME: 202601071558
Unit Type and Rh: 5100
Unit Type and Rh: 9500
Unit Type and Rh: 9500
Unit Type and Rh: 9500

## 2024-04-27 LAB — TYPE AND SCREEN
ABO/RH(D): O POS
Antibody Screen: NEGATIVE
Donor AG Type: NEGATIVE
Donor AG Type: NEGATIVE
Donor AG Type: NEGATIVE
Donor AG Type: NEGATIVE
Unit division: 0
Unit division: 0
Unit division: 0
Unit division: 0

## 2024-04-27 LAB — CULTURE, BLOOD (ROUTINE X 2)
Culture: NO GROWTH
Culture: NO GROWTH

## 2024-04-27 LAB — HEMOGLOBIN AND HEMATOCRIT, BLOOD
HCT: 28.6 % — ABNORMAL LOW (ref 39.0–52.0)
Hemoglobin: 10.2 g/dL — ABNORMAL LOW (ref 13.0–17.0)

## 2024-04-27 MED ORDER — BISACODYL 10 MG RE SUPP
10.0000 mg | Freq: Once | RECTAL | Status: AC
  Administered 2024-04-27: 10 mg via RECTAL
  Filled 2024-04-27: qty 1

## 2024-04-27 NOTE — Plan of Care (Signed)

## 2024-04-27 NOTE — Progress Notes (Addendum)
 Patient ID: Sukhdeep Wieting, male   DOB: 1991-02-04, 34 y.o.   MRN: [id number] Subjective: Eren Pautsch is a 34 year old male with a medical history significant for sickle cell disease, chronic pain syndrome, HIV infection, history of pulmonary embolism on Eliquis , generalized anxiety, and anemia of chronic disease was admitted for sickle cell pain crisis.   Patient is endorsing improved of 7/10.  He is afebrile, Blood transfusions completed however therapeutic transfusion completed.Improved urinary output. Patient has no new concerns today.  Denies headache, nausea, dizziness, vomiting.  No urinary symptoms.       Objective:  Vital signs in last 24 hours:  Vitals:   04/28/24 0846 04/28/24 0941 04/28/24 1037 04/28/24 1402  BP:   114/85 113/73  Pulse:   (!) 109 (!) 102  Resp: 16 18 16 16   Temp:   98.6 F (37 C) 98 F (36.7 C)  TempSrc:   Oral Oral  SpO2:   (!) 84% (!) 75%  Weight:      Height:        Intake/Output from previous day:   Intake/Output Summary (Last 24 hours) at 04/28/2024 1504 Last data filed at 04/28/2024 1402 Gross per 24 hour  Intake 10 ml  Output 1150 ml  Net -1140 ml    Physical Exam: General: Alert, awake, oriented x3, in no acute distress.  HEENT: [place]/AT PEERL, EOMI Neck: Trachea midline,  no masses, no thyromegal,y no JVD, no carotid bruit OROPHARYNX:  Moist, No exudate/ erythema/lesions.  Heart: Regular rate and rhythm, without murmurs, rubs, gallops, PMI non-displaced, no heaves or thrills on palpation.  Lungs: Clear to auscultation, no wheezing or rhonchi noted. No increased vocal fremitus resonant to percussion  Abdomen: Soft, nontender, nondistended, positive bowel sounds, no masses no hepatosplenomegaly noted..  Neuro: No focal neurological deficits noted cranial nerves II through XII grossly intact. DTRs 2+ bilaterally upper and lower extremities. Strength 5 out of 5 in bilateral upper and lower extremities. Musculoskeletal: Lower  back tenderness. Psychiatric: Patient alert and oriented x3, good insight and cognition, good recent to remote recall. Lymph node survey: No cervical axillary or inguinal lymphadenopathy noted.  Lab Results:  Basic Metabolic Panel:    Component Value Date/Time   NA 136 04/24/2024 1103   NA 140 01/12/2023 1607   K 4.8 04/24/2024 1103   CL 104 04/24/2024 1103   CO2 22 04/24/2024 1103   BUN 26 (H) 04/24/2024 1103   BUN 8 01/12/2023 1607   CREATININE 1.14 04/24/2024 1103   GLUCOSE 93 04/24/2024 1103   CALCIUM  8.5 (L) 04/24/2024 1103   CBC:    Component Value Date/Time   WBC 15.2 (H) 04/24/2024 0624   HGB 10.2 (L) 04/27/2024 0509   HGB 7.4 (L) 01/12/2023 1607   HCT 28.6 (L) 04/27/2024 0509   HCT 21.7 (L) 01/12/2023 1607   PLT 286 04/24/2024 0624   PLT 321 01/12/2023 1607   MCV 92.4 04/24/2024 0624   MCV 94 01/12/2023 1607   NEUTROABS 8.8 (H) 04/24/2024 0624   NEUTROABS 3.8 01/12/2023 1607   LYMPHSABS 4.5 (H) 04/24/2024 0624   LYMPHSABS 5.6 (H) 01/12/2023 1607   MONOABS 1.7 (H) 04/24/2024 0624   EOSABS 0.2 04/24/2024 0624   EOSABS 0.2 01/12/2023 1607   BASOSABS 0.1 04/24/2024 0624   BASOSABS 0.1 01/12/2023 1607    Recent Results (from the past 240 hours)  Culture, blood (routine x 2)     Status: Abnormal   Collection Time: 04/20/24  8:08 PM  Specimen: BLOOD RIGHT HAND  Result Value Ref Range Status   Specimen Description   Final    BLOOD RIGHT HAND Performed at [facility], [address], [place], [place] [address]    Special Requests   Final    BOTTLES DRAWN AEROBIC ONLY Blood Culture results may not be optimal due to an inadequate volume of blood received in culture bottles Performed at [facility], [address] [place], [place] [address]    Culture  Setup Time   Final    GRAM POSITIVE RODS AEROBIC BOTTLE ONLY CRITICAL RESULT CALLED TO, READ BACK BY AND VERIFIED WITH: MITCHELL L  PHARM D 04/24/2024 BY DD @ 0440      Culture (A)  Final    CORYNEBACTERIUM, GROUP JK Standardized susceptibility testing for this organism is not available. Performed at [facility], [address], [place], [place] [address]    Report Status 04/28/2024 FINAL  Final  Culture, blood (routine x 2)     Status: Abnormal   Collection Time: 04/20/24  8:08 PM   Specimen: BLOOD LEFT HAND  Result Value Ref Range Status   Specimen Description   Final    BLOOD LEFT HAND Performed at [facility], [address] [place], [place] [address]    Special Requests   Final    BOTTLES DRAWN AEROBIC ONLY Blood Culture results may not be optimal due to an inadequate volume of blood received in culture bottles Performed at [facility], [address] [place], [place] [address]    Culture  Setup Time   Final    GRAM POSITIVE RODS AEROBIC BOTTLE ONLY CRITICAL RESULT CALLED TO, READ BACK BY AND VERIFIED WITH: PHARMD M. BELL [id number] AT 1222, ADC    Culture (A)  Final    CORYNEBACTERIUM, GROUP JK CORYNEBACTERIUM MINUTISSIMUM Standardized susceptibility testing for this organism is not available. Performed at [facility], [address] [place], [place] [address]    Report Status 04/26/2024 FINAL  Final  Culture, blood (Routine X 2) w Reflex to ID Panel     Status: None   Collection Time: 04/22/24  1:16 PM   Specimen: BLOOD RIGHT HAND  Result Value Ref Range Status   Specimen Description   Final    BLOOD RIGHT HAND Performed at [facility], [address], [place], [place] [address]    Special Requests   Final    BOTTLES DRAWN AEROBIC AND ANAEROBIC Blood Culture results may not be optimal due to an inadequate volume of blood received in culture bottles Performed at [facility], [address], [place], [place] [address]    Culture   Final    NO GROWTH 5 DAYS Performed at [facility], [address] [place], [place] [address]    Report Status  04/27/2024 FINAL  Final  Culture, blood (Routine X 2) w Reflex to ID Panel     Status: None   Collection Time: 04/22/24  1:17 PM   Specimen: BLOOD RIGHT ARM  Result Value Ref Range Status   Specimen Description   Final    BLOOD RIGHT ARM Performed at [facility], [address], [place], [place] [address]    Special Requests   Final    BOTTLES DRAWN AEROBIC ONLY Blood Culture results may not be optimal due to an inadequate volume of blood received in culture bottles Performed at [facility], [address] [place], [place] [address]    Culture   Final    NO GROWTH 5 DAYS Performed at [facility], [address], [place], [place] [address]    Report Status 04/27/2024 FINAL  Final  Respiratory (~20 pathogens) panel by PCR     Status: None   Collection Time: 04/25/24  6:34 PM   Specimen: Nasopharyngeal Swab; Respiratory  Result Value Ref Range Status   Adenovirus NOT DETECTED NOT DETECTED Final   Coronavirus 229E NOT DETECTED NOT DETECTED Final    Comment: (NOTE) The Coronavirus on the Respiratory Panel, DOES NOT test for the novel  Coronavirus (2019 nCoV)    Coronavirus HKU1 NOT DETECTED NOT DETECTED Final   Coronavirus NL63 NOT DETECTED NOT DETECTED Final   Coronavirus OC43 NOT DETECTED NOT DETECTED Final   Metapneumovirus NOT DETECTED NOT DETECTED Final   Rhinovirus / Enterovirus NOT DETECTED NOT DETECTED Final   Influenza A NOT DETECTED NOT DETECTED Final   Influenza B NOT DETECTED NOT DETECTED Final   Parainfluenza Virus 1 NOT DETECTED NOT DETECTED Final   Parainfluenza Virus 2 NOT DETECTED NOT DETECTED Final   Parainfluenza Virus 3 NOT DETECTED NOT DETECTED Final   Parainfluenza Virus 4 NOT DETECTED NOT DETECTED Final   Respiratory Syncytial Virus NOT DETECTED NOT DETECTED Final   Bordetella pertussis NOT DETECTED NOT DETECTED Final   Bordetella Parapertussis NOT DETECTED NOT DETECTED Final   Chlamydophila pneumoniae NOT DETECTED NOT  DETECTED Final   Mycoplasma pneumoniae NOT DETECTED NOT DETECTED Final    Comment: Performed at [facility], [address], [place], [place] [address]    Studies/Results: No results found.   Medications: Scheduled Meds:  sodium chloride    Intravenous Once   abacavir -dolutegravir -lamiVUDine   1 tablet Oral QHS   apixaban   5 mg Oral BID   Chlorhexidine  Gluconate Cloth  6 each Topical Daily   diphenhydrAMINE   25 mg Oral Once   folic acid   1 mg Oral Daily   hydroxyurea   2,000 mg Oral QHS   senna-docusate  1 tablet Oral BID   sodium chloride  flush  10-40 mL Intracatheter Q12H   sodium chloride  flush  10-40 mL Intracatheter Q12H   Continuous Infusions:   PRN Meds:.albuterol , lip balm, ondansetron , oxyCODONE -acetaminophen  **AND** oxyCODONE , polyethylene glycol, sodium chloride  flush, sodium chloride  flush  Consultants: Infectious disease   Procedures: Blood exchange transfusion (completed)   Antibiotics: Rocephin  (Completed)   Assessment/Plan: Principal Problem:   Sickle cell anemia with crisis (HCC) Active Problems:   Generalized anxiety disorder   GERD (gastroesophageal reflux disease)   HIV (human immunodeficiency virus infection) (HCC)   Heart murmur   Chronic pain syndrome   History of pulmonary embolism   Acute on chronic respiratory failure with hypoxia (HCC)   Anemia of chronic disease   AKI (acute kidney injury)   Chronic hypoxic respiratory failure (HCC)    Sickle Cell Disease with Pain crisis:  Pain is gradually improving, decrease IVF 0.45% saline to KVO, continue weight based Dilaudid  PCA at current dose, continue oral home pain medications as ordered. Monitor vitals very closely, Re-evaluate pain scale regularly, 2 L of Oxygen  by [place]. Patient encouraged to ambulate on the hallway today.  LDH/a.m. labs pending.  Probably community: Acquired pneumonia versus acute chest syndrome: Continue supplemental oxygen  therapy at 6 L.  Continue antibiotics as  prescribed.  Leukocytosis: WBC elevated, gradually improving.  Anemia of Chronic Disease: LDH elevated. therapeutic phlebotomy orders in place, remove  700 cc and transfuse 1 unit packed red blood cells.  Completed, patient reports improved symptoms.   Chronic pain Syndrome: Continue oral home pain medication.  HIV: CD4 count at 22% , referral to infectious disease completed to reassess current antiviral medication. Infectious disease DR Annalee Orem advised that patient should follow up out patient after discharge.   Hyperbilirubinemia: Total bilirubin elevated with prior labs, however gradually improving. Discontinue all nephrotoxic medication.   History of pulmonary embolism: Continue Eliquis  as prescribed.     Code Status: Full Code Family Communication: N/A Disposition Plan: Not yet ready for discharge  [name] Cover NP  If 7PM-7AM, please contact night-coverage.  04/27/2024, 3:04 PM  LOS: 8 days

## 2024-04-28 ENCOUNTER — Telehealth: Payer: Self-pay | Admitting: *Deleted

## 2024-04-28 LAB — CULTURE, BLOOD (ROUTINE X 2)

## 2024-04-28 MED ORDER — HYDROMORPHONE 1 MG/ML IV SOLN: INTRAVENOUS | Status: DC

## 2024-04-28 MED ORDER — ONDANSETRON HCL 4 MG PO TABS
4.0000 mg | ORAL_TABLET | Freq: Three times a day (TID) | ORAL | Status: DC | PRN
  Administered 2024-04-28 – 2024-05-01 (×2): 4 mg via ORAL
  Filled 2024-04-28 (×2): qty 1

## 2024-04-28 NOTE — Plan of Care (Signed)
   Problem: Education: Goal: Knowledge of General Education information will improve Description Including pain rating scale, medication(s)/side effects and non-pharmacologic comfort measures Outcome: Progressing   Problem: Activity: Goal: Risk for activity intolerance will decrease Outcome: Progressing   Problem: Safety: Goal: Ability to remain free from injury will improve Outcome: Progressing

## 2024-04-28 NOTE — Plan of Care (Signed)
  Problem: Clinical Measurements: Goal: Will remain free from infection Outcome: Progressing Goal: Diagnostic test results will improve Outcome: Progressing   Problem: Activity: Goal: Risk for activity intolerance will decrease Outcome: Progressing   Problem: Elimination: Goal: Will not experience complications related to bowel motility Outcome: Progressing Goal: Will not experience complications related to urinary retention Outcome: Progressing   Problem: Pain Managment: Goal: General experience of comfort will improve and/or be controlled Outcome: Progressing   Problem: Safety: Goal: Ability to remain free from injury will improve Outcome: Progressing

## 2024-04-28 NOTE — Progress Notes (Signed)
 Patient ID: Martin [name], male   DOB: 04/27/1990, 34 y.o.   MRN: [id number] Subjective: Martin [name] is a 34 year old male with a medical history significant for sickle cell disease, chronic pain syndrome, HIV infection, history of pulmonary embolism on Eliquis , generalized anxiety, and anemia of chronic disease was admitted for sickle cell pain crisis.   Patient is endorsing improved of 8 /10 , no new concerns, denies headache, nausea, dizziness, vomiting.  No urinary symptoms.       Objective:  Vital signs in last 24 hours:  Vitals:   04/28/24 0846 04/28/24 0941 04/28/24 1037 04/28/24 1402  BP:   114/85 113/73  Pulse:   (!) 109 (!) 102  Resp: 16 18 16 16   Temp:   98.6 F (37 C) 98 F (36.7 C)  TempSrc:   Oral Oral  SpO2:   (!) 84% (!) 75%  Weight:      Height:        Intake/Output from previous day:   Intake/Output Summary (Last 24 hours) at 04/28/2024 1458 Last data filed at 04/28/2024 1402 Gross per 24 hour  Intake 10 ml  Output 1150 ml  Net -1140 ml    Physical Exam: General: Alert, awake, oriented x3, in no acute distress.  HEENT: [place]/AT PEERL, EOMI Neck: Trachea midline,  no masses, no thyromegal,y no JVD, no carotid bruit OROPHARYNX:  Moist, No exudate/ erythema/lesions.  Heart: Regular rate and rhythm, without murmurs, rubs, gallops, PMI non-displaced, no heaves or thrills on palpation.  Lungs: Clear to auscultation, no wheezing or rhonchi noted. No increased vocal fremitus resonant to percussion  Abdomen: Soft, nontender, nondistended, positive bowel sounds, no masses no hepatosplenomegaly noted..  Neuro: No focal neurological deficits noted cranial nerves II through XII grossly intact. DTRs 2+ bilaterally upper and lower extremities. Strength 5 out of 5 in bilateral upper and lower extremities. Musculoskeletal: Lower back tenderness. Psychiatric: Patient alert and oriented x3, good insight and cognition, good recent to remote recall. Lymph node  survey: No cervical axillary or inguinal lymphadenopathy noted.  Lab Results:  Basic Metabolic Panel:    Component Value Date/Time   NA 136 04/24/2024 1103   NA 140 01/12/2023 1607   K 4.8 04/24/2024 1103   CL 104 04/24/2024 1103   CO2 22 04/24/2024 1103   BUN 26 (H) 04/24/2024 1103   BUN 8 01/12/2023 1607   CREATININE 1.14 04/24/2024 1103   GLUCOSE 93 04/24/2024 1103   CALCIUM  8.5 (L) 04/24/2024 1103   CBC:    Component Value Date/Time   WBC 15.2 (H) 04/24/2024 0624   HGB 10.2 (L) 04/27/2024 0509   HGB 7.4 (L) 01/12/2023 1607   HCT 28.6 (L) 04/27/2024 0509   HCT 21.7 (L) 01/12/2023 1607   PLT 286 04/24/2024 0624   PLT 321 01/12/2023 1607   MCV 92.4 04/24/2024 0624   MCV 94 01/12/2023 1607   NEUTROABS 8.8 (H) 04/24/2024 0624   NEUTROABS 3.8 01/12/2023 1607   LYMPHSABS 4.5 (H) 04/24/2024 0624   LYMPHSABS 5.6 (H) 01/12/2023 1607   MONOABS 1.7 (H) 04/24/2024 0624   EOSABS 0.2 04/24/2024 0624   EOSABS 0.2 01/12/2023 1607   BASOSABS 0.1 04/24/2024 0624   BASOSABS 0.1 01/12/2023 1607    Recent Results (from the past 240 hours)  Culture, blood (routine x 2)     Status: Abnormal   Collection Time: 04/20/24  8:08 PM   Specimen: BLOOD RIGHT HAND  Result Value Ref Range Status   Specimen Description  Final    BLOOD RIGHT HAND Performed at [facility], [address], [place], [place] [address]    Special Requests   Final    BOTTLES DRAWN AEROBIC ONLY Blood Culture results may not be optimal due to an inadequate volume of blood received in culture bottles Performed at [facility], [address], [place], [place] [address]    Culture  Setup Time   Final    GRAM POSITIVE RODS AEROBIC BOTTLE ONLY CRITICAL RESULT CALLED TO, READ BACK BY AND VERIFIED WITH: MITCHELL L  PHARM D 04/24/2024 BY DD @ 0440     Culture (A)  Final    CORYNEBACTERIUM, GROUP JK Standardized susceptibility testing for this organism is not available. Performed  at [facility], [address] [place], [place] [address]    Report Status 04/28/2024 FINAL  Final  Culture, blood (routine x 2)     Status: Abnormal   Collection Time: 04/20/24  8:08 PM   Specimen: BLOOD LEFT HAND  Result Value Ref Range Status   Specimen Description   Final    BLOOD LEFT HAND Performed at [facility], [address], [place], [place] [address]    Special Requests   Final    BOTTLES DRAWN AEROBIC ONLY Blood Culture results may not be optimal due to an inadequate volume of blood received in culture bottles Performed at [facility], [address] [place], [place] [address]    Culture  Setup Time   Final    GRAM POSITIVE RODS AEROBIC BOTTLE ONLY CRITICAL RESULT CALLED TO, READ BACK BY AND VERIFIED WITH: PHARMD M. BELL [id number] AT 1222, ADC    Culture (A)  Final    CORYNEBACTERIUM, GROUP JK CORYNEBACTERIUM MINUTISSIMUM Standardized susceptibility testing for this organism is not available. Performed at [facility], [address], [place], [place] [address]    Report Status 04/26/2024 FINAL  Final  Culture, blood (Routine X 2) w Reflex to ID Panel     Status: None   Collection Time: 04/22/24  1:16 PM   Specimen: BLOOD RIGHT HAND  Result Value Ref Range Status   Specimen Description   Final    BLOOD RIGHT HAND Performed at [facility], [address], [place], [place] [address]    Special Requests   Final    BOTTLES DRAWN AEROBIC AND ANAEROBIC Blood Culture results may not be optimal due to an inadequate volume of blood received in culture bottles Performed at [facility], [address] [place], [place] [address]    Culture   Final    NO GROWTH 5 DAYS Performed at [facility], [address], [place], [place] [address]    Report Status 04/27/2024 FINAL  Final  Culture, blood (Routine X 2) w Reflex to ID Panel     Status: None   Collection Time: 04/22/24  1:17 PM    Specimen: BLOOD RIGHT ARM  Result Value Ref Range Status   Specimen Description   Final    BLOOD RIGHT ARM Performed at [facility], [address], [place], [place] [address]    Special Requests   Final    BOTTLES DRAWN AEROBIC ONLY Blood Culture results may not be optimal due to an inadequate volume of blood received in culture bottles Performed at [facility], [address], [place], [place] [address]    Culture   Final  NO GROWTH 5 DAYS Performed at [facility], [address], [place], [place] [address]    Report Status 04/27/2024 FINAL  Final  Respiratory (~20 pathogens) panel by PCR     Status: None   Collection Time: 04/25/24  6:34 PM   Specimen: Nasopharyngeal Swab; Respiratory  Result Value Ref Range Status   Adenovirus NOT DETECTED NOT DETECTED Final   Coronavirus 229E NOT DETECTED NOT DETECTED Final    Comment: (NOTE) The Coronavirus on the Respiratory Panel, DOES NOT test for the novel  Coronavirus (2019 nCoV)    Coronavirus HKU1 NOT DETECTED NOT DETECTED Final   Coronavirus NL63 NOT DETECTED NOT DETECTED Final   Coronavirus OC43 NOT DETECTED NOT DETECTED Final   Metapneumovirus NOT DETECTED NOT DETECTED Final   Rhinovirus / Enterovirus NOT DETECTED NOT DETECTED Final   Influenza A NOT DETECTED NOT DETECTED Final   Influenza B NOT DETECTED NOT DETECTED Final   Parainfluenza Virus 1 NOT DETECTED NOT DETECTED Final   Parainfluenza Virus 2 NOT DETECTED NOT DETECTED Final   Parainfluenza Virus 3 NOT DETECTED NOT DETECTED Final   Parainfluenza Virus 4 NOT DETECTED NOT DETECTED Final   Respiratory Syncytial Virus NOT DETECTED NOT DETECTED Final   Bordetella pertussis NOT DETECTED NOT DETECTED Final   Bordetella Parapertussis NOT DETECTED NOT DETECTED Final   Chlamydophila pneumoniae NOT DETECTED NOT DETECTED Final   Mycoplasma pneumoniae NOT DETECTED NOT DETECTED Final    Comment: Performed at [facility], [address] [place], [place] [address]    Studies/Results: No results found.   Medications: Scheduled Meds:  sodium chloride    Intravenous Once   abacavir -dolutegravir -lamiVUDine   1 tablet Oral QHS   apixaban   5 mg Oral BID   Chlorhexidine  Gluconate Cloth  6 each Topical Daily   diphenhydrAMINE   25 mg Oral Once   folic acid   1 mg Oral Daily   hydroxyurea   2,000 mg Oral QHS   senna-docusate  1 tablet Oral BID   sodium chloride  flush  10-40 mL Intracatheter Q12H   sodium chloride  flush  10-40 mL Intracatheter Q12H   Continuous Infusions:   PRN Meds:.albuterol , lip balm, ondansetron , oxyCODONE -acetaminophen  **AND** oxyCODONE , polyethylene glycol, sodium chloride  flush, sodium chloride  flush  Consultants: Infectious disease   Procedures: Blood exchange transfusion (completed)   Antibiotics: Rocephin  (Completed)   Assessment/Plan: Principal Problem:   Sickle cell anemia with crisis (HCC) Active Problems:   Generalized anxiety disorder   GERD (gastroesophageal reflux disease)   HIV (human immunodeficiency virus infection) (HCC)   Heart murmur   Chronic pain syndrome   History of pulmonary embolism   Acute on chronic respiratory failure with hypoxia (HCC)   Anemia of chronic disease   AKI (acute kidney injury)   Chronic hypoxic respiratory failure (HCC)    Sickle Cell Disease with Pain crisis:  Pain is gradually improving, decrease IVF 0.45% saline to KVO, discontinued weight based Dilaudid  PCA to help transition patient/wean off.  Continue oral home pain medications as ordered. Monitor vitals very closely, Re-evaluate pain scale regularly, 2 L of Oxygen  by [place].   Probably community: Acquired pneumonia versus acute chest syndrome: Continue supplemental oxygen  therapy at 6 L.  Antibiotics completed.  Leukocytosis: WBC elevated, gradually improving.  Anemia of Chronic Disease: LDH elevated. therapeutic phlebotomy orders in place, remove 700 cc and transfuse 1 unit packed red blood  cells.  Completed, patient reports improved symptoms.   Chronic pain Syndrome: Continue oral home pain medication.  HIV: CD4  count at 22% , referral to infectious disease completed to reassess current antiviral medication. Infectious disease DR Annalee Orem advised that patient should follow up out patient after discharge.   Hyperbilirubinemia: Total bilirubin elevated with prior labs, however gradually improving. discontinue all nephrotoxic medication.   History of pulmonary embolism: Continue Eliquis  as prescribed.     Code Status: Full Code Family Communication: N/A Disposition Plan: Not yet ready for discharge  [name] Cover NP  If 7PM-7AM, please contact night-coverage.  04/28/2024, 2:58 PM  LOS: 8 days

## 2024-04-28 NOTE — Plan of Care (Addendum)
 1200: 1.8mg  admin-PCA discontinued and wasted.  Problem: Education: Goal: Knowledge of vaso-occlusive preventative measures will improve Outcome: Progressing Goal: Awareness of infection prevention will improve Outcome: Progressing Goal: Awareness of signs and symptoms of anemia will improve Outcome: Progressing Goal: Long-term complications will improve Outcome: Progressing   Problem: Self-Care: Goal: Ability to incorporate actions that prevent/reduce pain crisis will improve Outcome: Progressing   Problem: Bowel/Gastric: Goal: Gut motility will be maintained Outcome: Progressing   Problem: Tissue Perfusion: Goal: Complications related to inadequate tissue perfusion will be avoided or minimized Outcome: Progressing   Problem: Respiratory: Goal: Pulmonary complications will be avoided or minimized Outcome: Progressing Goal: Acute Chest Syndrome will be identified early to prevent complications Outcome: Progressing   Problem: Fluid Volume: Goal: Ability to maintain a balanced intake and output will improve Outcome: Progressing   Problem: Sensory: Goal: Pain level will decrease with appropriate interventions Outcome: Progressing   Problem: Health Behavior: Goal: Postive changes in compliance with treatment and prescription regimens will improve Outcome: Progressing   Problem: Education: Goal: Knowledge of General Education information will improve Description: Including pain rating scale, medication(s)/side effects and non-pharmacologic comfort measures Outcome: Progressing   Problem: Health Behavior/Discharge Planning: Goal: Ability to manage health-related needs will improve Outcome: Progressing   Problem: Clinical Measurements: Goal: Ability to maintain clinical measurements within normal limits will improve Outcome: Progressing Goal: Will remain free from infection Outcome: Progressing Goal: Diagnostic test results will improve Outcome: Progressing Goal:  Respiratory complications will improve Outcome: Progressing Goal: Cardiovascular complication will be avoided Outcome: Progressing   Problem: Activity: Goal: Risk for activity intolerance will decrease Outcome: Progressing   Problem: Nutrition: Goal: Adequate nutrition will be maintained Outcome: Progressing   Problem: Coping: Goal: Level of anxiety will decrease Outcome: Progressing   Problem: Elimination: Goal: Will not experience complications related to bowel motility Outcome: Progressing Goal: Will not experience complications related to urinary retention Outcome: Progressing   Problem: Pain Managment: Goal: General experience of comfort will improve and/or be controlled Outcome: Progressing   Problem: Safety: Goal: Ability to remain free from injury will improve Outcome: Progressing   Problem: Skin Integrity: Goal: Risk for impaired skin integrity will decrease Outcome: Progressing

## 2024-04-29 LAB — CBC
HCT: 25.1 % — ABNORMAL LOW (ref 39.0–52.0)
Hemoglobin: 10.1 g/dL — ABNORMAL LOW (ref 13.0–17.0)
MCH: 35.9 pg — ABNORMAL HIGH (ref 26.0–34.0)
MCHC: 40.2 g/dL — ABNORMAL HIGH (ref 30.0–36.0)
MCV: 89.3 fL (ref 80.0–100.0)
Platelets: 253 K/uL (ref 150–400)
RBC: 2.81 MIL/uL — ABNORMAL LOW (ref 4.22–5.81)
RDW: 24.4 % — ABNORMAL HIGH (ref 11.5–15.5)
WBC: 7.7 K/uL (ref 4.0–10.5)
nRBC: 176.4 % — ABNORMAL HIGH (ref 0.0–0.2)

## 2024-04-29 MED ORDER — HYDROMORPHONE HCL 1 MG/ML IJ SOLN
1.0000 mg | INTRAMUSCULAR | Status: DC | PRN
  Administered 2024-04-29 – 2024-05-01 (×12): 1 mg via INTRAVENOUS
  Filled 2024-04-29 (×12): qty 1

## 2024-04-29 MED ORDER — GUAIFENESIN-DM 100-10 MG/5ML PO SYRP
5.0000 mL | ORAL_SOLUTION | ORAL | Status: DC | PRN
  Administered 2024-04-29 – 2024-05-01 (×6): 5 mL via ORAL
  Filled 2024-04-29 (×5): qty 5

## 2024-04-29 NOTE — Progress Notes (Signed)
 SICKLE CELL SERVICE PROGRESS NOTE  Martin [name] FMW:[id number] DOB: [birth year] DOA: 04/20/2024 PCP: [name], [name], [name]  Assessment/Plan: Principal Problem:   Sickle cell anemia with crisis (HCC) Active Problems:   HIV (human immunodeficiency virus infection) (HCC)   History of pulmonary embolism   Generalized anxiety disorder   GERD (gastroesophageal reflux disease)   Heart murmur   Chronic pain syndrome   Acute on chronic respiratory failure with hypoxia (HCC)   Anemia of chronic disease   AKI (acute kidney injury)   Chronic hypoxic respiratory failure (HCC)  Sickle Cell Pain Crisis: Improving on Percocet.  Patient however is having worsening pain at the moment and needs some rescue.  Initiate 1 mg Dilaudid  every 4 hours as needed for breakthrough pain.  Plan for possible discharge in the next day or 2. Acute on chronic respiratory failure: Improved at the moment.  Patient is on oxygen . HIV disease: CD4 count low also patient has AIDS.  Patient on anti-HIV medications.  Will need to follow-up with ID for further adjustments. Anemia of chronic disease: H&H is stable. History of pulmonary embolism: Continue anticoagulation. Generalized anxiety disorder: Continue home regimen Disposition: Continue home regimen.  Code Status: Full code Family Communication: No family at bedside Disposition Plan: Home when ready  [facility]  Pager [phone]. If 7PM-7AM, please contact night-coverage.  04/29/2024, 8:57 AM  LOS: 9 days   Brief narrative: Martin [name]  is a 34 y.o. male  with history of sickle cell disease, history of acute chest syndrome, multiple sickle cell crisis, multiple ED visits and hospital admission, history of PE currently on Eliquis , chronic hypoxic respiratory failure on home oxygen , HIV infection and generalized anxiety disorder who presented to the emergency department with major complaints of generalized body pain, shortness of breath, and chest  pain that is consistent with his typical sickle cell pain crisis.  He last took his home pain medication this morning with no resolution to pain symptoms.  Patient is afebrile, denies nausea, vomiting, diarrhea.  No headaches, dizziness or fainting.  Denies recent sick contacts, no urinary symptoms.   Consultants: None  Procedures: Chest x-ray  Antibiotics: None  HPI/Subjective: Patient still complain of pain, having significant shortness of breath.  Objective: Vitals:   04/28/24 1402 04/28/24 1836 04/28/24 2119 04/29/24 0557  BP: 113/73 108/85 109/86 103/70  Pulse: (!) 102 99 96 87  Resp: 16 16 18 18   Temp: 98 F (36.7 C) 98.6 F (37 C) 97.8 F (36.6 C) 98.5 F (36.9 C)  TempSrc: Oral Oral Oral Oral  SpO2: (!) 75% (!) 83% 94% 92%  Weight:      Height:       Weight change:   Intake/Output Summary (Last 24 hours) at 04/29/2024 0857 Last data filed at 04/28/2024 1402 Gross per 24 hour  Intake --  Output 400 ml  Net -400 ml    General: Alert, awake, oriented x3, in no acute distress.  HEENT: [place]/AT PEERL, EOMI Neck: Trachea midline,  no masses, no thyromegal,y no JVD, no carotid bruit OROPHARYNX:  Moist, No exudate/ erythema/lesions.  Heart: Regular rate and rhythm, without murmurs, rubs, gallops, PMI non-displaced, no heaves or thrills on palpation.  Lungs: Clear to auscultation, no wheezing or rhonchi noted. No increased vocal fremitus resonant to percussion  Abdomen: Soft, nontender, nondistended, positive bowel sounds, no masses no hepatosplenomegaly noted..  Neuro: No focal neurological deficits noted cranial nerves II through XII grossly intact. DTRs 2+ bilaterally upper and lower extremities. Strength 5  out of 5 in bilateral upper and lower extremities. Musculoskeletal: No warm swelling or erythema around joints, no spinal tenderness noted. Psychiatric: Patient alert and oriented x3, good insight and cognition, good recent to remote recall. Lymph node survey: No  cervical axillary or inguinal lymphadenopathy noted.   Data Reviewed: Basic Metabolic Panel: Recent Labs  Lab 04/24/24 1103  NA 136  K 4.8  CL 104  CO2 22  GLUCOSE 93  BUN 26*  CREATININE 1.14  CALCIUM  8.5*   Liver Function Tests: Recent Labs  Lab 04/22/24 1317 04/24/24 1103  AST 118* 183*  ALT 72* 138*  ALKPHOS 85 81  BILITOT 13.5* 12.1*  PROT 8.0 7.4  ALBUMIN 3.9 3.6   No results for input(s): LIPASE, AMYLASE in the last 168 hours. No results for input(s): AMMONIA in the last 168 hours. CBC: Recent Labs  Lab 04/23/24 0707 04/24/24 0624 04/26/24 0540 04/27/24 0509 04/29/24 0412  WBC 15.2* 15.2*  --   --  7.7  NEUTROABS  --  8.8*  --   --   --   HGB 7.4* 8.2* 10.0* 10.2* 10.1*  HCT 20.3* 23.0* 27.1* 28.6* 25.1*  MCV 92.7 92.4  --   --  89.3  PLT 262 286  --   --  253   Cardiac Enzymes: No results for input(s): CKTOTAL, CKMB, CKMBINDEX, TROPONINI in the last 168 hours. BNP (last 3 results) No results for input(s): BNP in the last 8760 hours.  ProBNP (last 3 results) Recent Labs    01/16/24 1816  PROBNP 2,143.0*    CBG: No results for input(s): GLUCAP in the last 168 hours.  Recent Results (from the past 240 hours)  Culture, blood (routine x 2)     Status: Abnormal   Collection Time: 04/20/24  8:08 PM   Specimen: BLOOD RIGHT HAND  Result Value Ref Range Status   Specimen Description   Final    BLOOD RIGHT HAND Performed at [facility], [address] [place], [place] [address]    Special Requests   Final    BOTTLES DRAWN AEROBIC ONLY Blood Culture results may not be optimal due to an inadequate volume of blood received in culture bottles Performed at [facility], [address] [place], [place] [address]    Culture  Setup Time   Final    GRAM POSITIVE RODS AEROBIC BOTTLE ONLY CRITICAL RESULT CALLED TO, READ BACK BY AND VERIFIED WITH: MITCHELL L  PHARM D 04/24/2024 BY DD @ 0440      Culture (A)  Final    CORYNEBACTERIUM, GROUP JK Standardized susceptibility testing for this organism is not available. Performed at [facility], [address] [place], [place] [address]    Report Status 04/28/2024 FINAL  Final  Culture, blood (routine x 2)     Status: Abnormal   Collection Time: 04/20/24  8:08 PM   Specimen: BLOOD LEFT HAND  Result Value Ref Range Status   Specimen Description   Final    BLOOD LEFT HAND Performed at [facility], [name], [place], [place] [address]    Special Requests   Final    BOTTLES DRAWN AEROBIC ONLY Blood Culture results may not be optimal due to an inadequate volume of blood received in culture bottles Performed at [facility], [address] [place], [place] [address]    Culture  Setup Time   Final    GRAM POSITIVE RODS AEROBIC BOTTLE  ONLY CRITICAL RESULT CALLED TO, READ BACK BY AND VERIFIED WITH: PHARMD M. BELL [id number] AT 1222, ADC    Culture (A)  Final    CORYNEBACTERIUM, GROUP JK CORYNEBACTERIUM MINUTISSIMUM Standardized susceptibility testing for this organism is not available. Performed at [facility], [address] [place], [place] [address]    Report Status 04/26/2024 FINAL  Final  Culture, blood (Routine X 2) w Reflex to ID Panel     Status: None   Collection Time: 04/22/24  1:16 PM   Specimen: BLOOD RIGHT HAND  Result Value Ref Range Status   Specimen Description   Final    BLOOD RIGHT HAND Performed at [facility], [address], [place], [place] [address]    Special Requests   Final    BOTTLES DRAWN AEROBIC AND ANAEROBIC Blood Culture results may not be optimal due to an inadequate volume of blood received in culture bottles Performed at [facility], [address] [place], [place] [address]    Culture   Final    NO GROWTH 5 DAYS Performed at [facility], [address] [place], [place] [address]    Report Status  04/27/2024 FINAL  Final  Culture, blood (Routine X 2) w Reflex to ID Panel     Status: None   Collection Time: 04/22/24  1:17 PM   Specimen: BLOOD RIGHT ARM  Result Value Ref Range Status   Specimen Description   Final    BLOOD RIGHT ARM Performed at [facility], [address] [place], [place] [address]    Special Requests   Final    BOTTLES DRAWN AEROBIC ONLY Blood Culture results may not be optimal due to an inadequate volume of blood received in culture bottles Performed at [facility], [address], [place], [place] [address]    Culture   Final    NO GROWTH 5 DAYS Performed at [facility], [address] [place], [place] [address]    Report Status 04/27/2024 FINAL  Final  Respiratory (~20 pathogens) panel by PCR     Status: None   Collection Time: 04/25/24  6:34 PM   Specimen: Nasopharyngeal Swab; Respiratory  Result Value Ref Range Status   Adenovirus NOT DETECTED NOT DETECTED Final   Coronavirus 229E NOT DETECTED NOT DETECTED Final    Comment: (NOTE) The Coronavirus on the Respiratory Panel, DOES NOT test for the novel  Coronavirus (2019 nCoV)    Coronavirus HKU1 NOT DETECTED NOT DETECTED Final   Coronavirus NL63 NOT DETECTED NOT DETECTED Final   Coronavirus OC43 NOT DETECTED NOT DETECTED Final   Metapneumovirus NOT DETECTED NOT DETECTED Final   Rhinovirus / Enterovirus NOT DETECTED NOT DETECTED Final   Influenza A NOT DETECTED NOT DETECTED Final   Influenza B NOT DETECTED NOT DETECTED Final   Parainfluenza Virus 1 NOT DETECTED NOT DETECTED Final   Parainfluenza Virus 2 NOT DETECTED NOT DETECTED Final   Parainfluenza Virus 3 NOT DETECTED NOT DETECTED Final   Parainfluenza Virus 4 NOT DETECTED NOT DETECTED Final   Respiratory Syncytial Virus NOT DETECTED NOT DETECTED Final   Bordetella pertussis NOT DETECTED NOT DETECTED Final   Bordetella Parapertussis NOT DETECTED NOT DETECTED Final   Chlamydophila pneumoniae NOT DETECTED NOT  DETECTED Final   Mycoplasma pneumoniae NOT DETECTED NOT DETECTED Final    Comment: Performed at [facility], [address] [place], [place] [address]     Studies:  DG CHEST PORT 1 VIEW Result Date: 04/22/2024 CLINICAL DATA:  Dyspnea EXAM: PORTABLE CHEST 1 VIEW COMPARISON:  Two days ago FINDINGS: Stable cardiomegaly. Right internal jugular Port-A-Cath is unchanged. Stable mild bibasilar interstitial densities concerning for possible chronic scarring and possible superimposed edema. Bony thorax is unremarkable. IMPRESSION: Stable mild bibasilar interstitial densities as described above. Electronically Signed   By: Lynwood Landy Raddle M.D.   On: 04/22/2024 12:40   DG Chest 2 View Result Date: 04/20/2024 EXAM: 2 VIEW(S) XRAY OF THE CHEST 04/20/2024 12:51:02 PM COMPARISON: Chest x-ray 03/05/2024, CXR 11/07/2023, CT chest 02/08/2024. CLINICAL HISTORY: chest pain, dyspnea, sickle cell FINDINGS: LINES, TUBES AND DEVICES: Right IJ Port-A-Cath stable in position. LUNGS AND PLEURA: Persistent prominent hilar vasculature and coarsened interstitial markings. Persistent Patchy airspace opacities of the lungs. No pleural effusion. No pneumothorax. HEART AND MEDIASTINUM: Mild cardiomegaly, unchanged. No acute abnormality of the mediastinal silhouette. BONES AND SOFT TISSUES: No acute osseous abnormality. IMPRESSION: 1. Mild pulmonary edema with underlying chronic coarsening interstitial markings and patchy airspace opacities. 2. Right IJ Port-A-Cath in stable position. Electronically signed by: Morgane Naveau MD 04/20/2024 01:40 PM EST RP Workstation: [id number]    Scheduled Meds:  sodium chloride    Intravenous Once   abacavir -dolutegravir -lamiVUDine   1 tablet Oral QHS   apixaban   5 mg Oral BID   Chlorhexidine  Gluconate Cloth  6 each Topical Daily   diphenhydrAMINE   25 mg Oral Once   folic acid   1 mg Oral Daily   hydroxyurea   2,000 mg Oral QHS   senna-docusate  1 tablet Oral BID   sodium chloride  flush   10-40 mL Intracatheter Q12H   sodium chloride  flush  10-40 mL Intracatheter Q12H   Continuous Infusions:  Principal Problem:   Sickle cell anemia with crisis (HCC) Active Problems:   HIV (human immunodeficiency virus infection) (HCC)   History of pulmonary embolism   Generalized anxiety disorder   GERD (gastroesophageal reflux disease)   Heart murmur   Chronic pain syndrome   Acute on chronic respiratory failure with hypoxia (HCC)   Anemia of chronic disease   AKI (acute kidney injury)   Chronic hypoxic respiratory failure (HCC)

## 2024-04-29 NOTE — Plan of Care (Signed)
  Problem: Clinical Measurements: Goal: Will remain free from infection Outcome: Progressing Goal: Diagnostic test results will improve Outcome: Progressing Goal: Respiratory complications will improve Outcome: Progressing Goal: Cardiovascular complication will be avoided Outcome: Progressing   Problem: Activity: Goal: Risk for activity intolerance will decrease Outcome: Progressing   Problem: Elimination: Goal: Will not experience complications related to bowel motility Outcome: Progressing Goal: Will not experience complications related to urinary retention Outcome: Progressing   Problem: Pain Managment: Goal: General experience of comfort will improve and/or be controlled Outcome: Progressing

## 2024-04-30 LAB — CBC
HCT: 28 % — ABNORMAL LOW (ref 39.0–52.0)
Hemoglobin: 10.1 g/dL — ABNORMAL LOW (ref 13.0–17.0)
MCH: 32.9 pg (ref 26.0–34.0)
MCHC: 36.1 g/dL — ABNORMAL HIGH (ref 30.0–36.0)
MCV: 91.2 fL (ref 80.0–100.0)
Platelets: 311 K/uL (ref 150–400)
RBC: 3.07 MIL/uL — ABNORMAL LOW (ref 4.22–5.81)
RDW: 26.9 % — ABNORMAL HIGH (ref 11.5–15.5)
WBC: 8.3 K/uL (ref 4.0–10.5)
nRBC: 107.4 % — ABNORMAL HIGH (ref 0.0–0.2)

## 2024-04-30 NOTE — Progress Notes (Signed)
 Patient had removed oxygen  during rounding and assessment. Patient encouraged to wear oxygen  at both times by the RN, but refused both times. Patient educated.

## 2024-04-30 NOTE — Progress Notes (Signed)
 SICKLE CELL SERVICE PROGRESS NOTE  Login [name] FMW:[id number] DOB: 1991/01/28 DOA: 04/20/2024 PCP: [name], [name], [name]  Assessment/Plan: Principal Problem:   Sickle cell anemia with crisis (HCC) Active Problems:   HIV (human immunodeficiency virus infection) (HCC)   History of pulmonary embolism   Generalized anxiety disorder   GERD (gastroesophageal reflux disease)   Heart murmur   Chronic pain syndrome   Acute on chronic respiratory failure with hypoxia (HCC)   Anemia of chronic disease   AKI (acute kidney injury)   Chronic hypoxic respiratory failure (HCC)  Sickle Cell Pain Crisis: Patient is still complaining of pain despite the fact that he is improving on Percocet.  Patient however is having worsening pain at the moment and needs some rescue.  Initiate 1 mg Dilaudid  every 4 hours as needed for breakthrough pain.  Plan for possible discharge in the next day or 2. Acute on chronic respiratory failure: Improved at the moment.  Patient is on oxygen . HIV disease: CD4 count low also patient has AIDS.  Patient on anti-HIV medications.  Will need to follow-up with ID for further adjustments. Anemia of chronic disease: H&H is stable. History of pulmonary embolism: Continue anticoagulation. Generalized anxiety disorder: Continue home regimen Disposition: Continue home regimen.  Patient will be discharged home hopefully tomorrow  Code Status: Full code Family Communication: No family at bedside Disposition Plan: Home when ready  [facility]  Pager [phone]. If 7PM-7AM, please contact night-coverage.  04/30/2024, 1:26 PM  LOS: 10 days   Brief narrative: Martin [name]  is a 34 y.o. male  with history of sickle cell disease, history of acute chest syndrome, multiple sickle cell crisis, multiple ED visits and hospital admission, history of PE currently on Eliquis , chronic hypoxic respiratory failure on home oxygen , HIV infection and generalized anxiety disorder who  presented to the emergency department with major complaints of generalized body pain, shortness of breath, and chest pain that is consistent with his typical sickle cell pain crisis.  He last took his home pain medication this morning with no resolution to pain symptoms.  Patient is afebrile, denies nausea, vomiting, diarrhea.  No headaches, dizziness or fainting.  Denies recent sick contacts, no urinary symptoms.   Consultants: None  Procedures: Chest x-ray  Antibiotics: None  HPI/Subjective: Patient still complain of pain, having significant shortness of breath.  Patient has no moved around.  He has been laying in bed.  Encouraged patient to walk today in preparation for possible discharge tomorrow.  Objective: Vitals:   04/29/24 1425 04/29/24 2113 04/30/24 0524 04/30/24 1039  BP: 99/69 106/82 109/77 109/77  Pulse: 90 83 97 96  Resp: 16 14 16 16   Temp: 98.3 F (36.8 C) 97.8 F (36.6 C) 97.6 F (36.4 C) 97.9 F (36.6 C)  TempSrc: Oral Oral Oral Oral  SpO2: 93% 97% 90% (!) 81%  Weight:      Height:       Weight change:   Intake/Output Summary (Last 24 hours) at 04/30/2024 1326 Last data filed at 04/30/2024 1039 Gross per 24 hour  Intake --  Output 1600 ml  Net -1600 ml    General: Alert, awake, oriented x3, in no acute distress.  HEENT: [place]/AT PEERL, EOMI Neck: Trachea midline,  no masses, no thyromegal,y no JVD, no carotid bruit OROPHARYNX:  Moist, No exudate/ erythema/lesions.  Heart: Regular rate and rhythm, without murmurs, rubs, gallops, PMI non-displaced, no heaves or thrills on palpation.  Lungs: Clear to auscultation, no wheezing or rhonchi noted.  No increased vocal fremitus resonant to percussion  Abdomen: Soft, nontender, nondistended, positive bowel sounds, no masses no hepatosplenomegaly noted..  Neuro: No focal neurological deficits noted cranial nerves II through XII grossly intact. DTRs 2+ bilaterally upper and lower extremities. Strength 5 out of 5 in  bilateral upper and lower extremities. Musculoskeletal: No warm swelling or erythema around joints, no spinal tenderness noted. Psychiatric: Patient alert and oriented x3, good insight and cognition, good recent to remote recall. Lymph node survey: No cervical axillary or inguinal lymphadenopathy noted.   Data Reviewed: Basic Metabolic Panel: Recent Labs  Lab 04/24/24 1103  NA 136  K 4.8  CL 104  CO2 22  GLUCOSE 93  BUN 26*  CREATININE 1.14  CALCIUM  8.5*   Liver Function Tests: Recent Labs  Lab 04/24/24 1103  AST 183*  ALT 138*  ALKPHOS 81  BILITOT 12.1*  PROT 7.4  ALBUMIN 3.6   No results for input(s): LIPASE, AMYLASE in the last 168 hours. No results for input(s): AMMONIA in the last 168 hours. CBC: Recent Labs  Lab 04/24/24 0624 04/26/24 0540 04/27/24 0509 04/29/24 0412 04/30/24 0610  WBC 15.2*  --   --  7.7 8.3  NEUTROABS 8.8*  --   --   --   --   HGB 8.2* 10.0* 10.2* 10.1* 10.1*  HCT 23.0* 27.1* 28.6* 25.1* 28.0*  MCV 92.4  --   --  89.3 91.2  PLT 286  --   --  253 311   Cardiac Enzymes: No results for input(s): CKTOTAL, CKMB, CKMBINDEX, TROPONINI in the last 168 hours. BNP (last 3 results) No results for input(s): BNP in the last 8760 hours.  ProBNP (last 3 results) Recent Labs    01/16/24 1816  PROBNP 2,143.0*    CBG: No results for input(s): GLUCAP in the last 168 hours.  Recent Results (from the past 240 hours)  Culture, blood (routine x 2)     Status: Abnormal   Collection Time: 04/20/24  8:08 PM   Specimen: BLOOD RIGHT HAND  Result Value Ref Range Status   Specimen Description   Final    BLOOD RIGHT HAND Performed at [facility], [address], [place], [place] [address]    Special Requests   Final    BOTTLES DRAWN AEROBIC ONLY Blood Culture results may not be optimal due to an inadequate volume of blood received in culture bottles Performed at [facility], [address], [place], [place] [address]    Culture  Setup Time   Final    GRAM POSITIVE RODS AEROBIC BOTTLE ONLY CRITICAL RESULT CALLED TO, READ BACK BY AND VERIFIED WITH: MITCHELL L  PHARM D 04/24/2024 BY DD @ 0440     Culture (A)  Final    CORYNEBACTERIUM, GROUP JK Standardized susceptibility testing for this organism is not available. Performed at [facility], [address], [place], [place] [address]    Report Status 04/28/2024 FINAL  Final  Culture, blood (routine x 2)     Status: Abnormal   Collection Time: 04/20/24  8:08 PM   Specimen: BLOOD LEFT HAND  Result Value Ref Range Status   Specimen Description   Final    BLOOD LEFT HAND Performed at [facility], [address] [place], [place] [address]    Special Requests   Final    BOTTLES DRAWN AEROBIC ONLY Blood Culture results may not be optimal due to an inadequate volume of blood  received in culture bottles Performed at [facility], [address] [place], [place] [address]    Culture  Setup Time   Final    GRAM POSITIVE RODS AEROBIC BOTTLE ONLY CRITICAL RESULT CALLED TO, READ BACK BY AND VERIFIED WITH: PHARMD M. BELL [id number] AT 1222, ADC    Culture (A)  Final    CORYNEBACTERIUM, GROUP JK CORYNEBACTERIUM MINUTISSIMUM Standardized susceptibility testing for this organism is not available. Performed at [facility], [address] [place], [place] [address]    Report Status 04/26/2024 FINAL  Final  Culture, blood (Routine X 2) w Reflex to ID Panel     Status: None   Collection Time: 04/22/24  1:16 PM   Specimen: BLOOD RIGHT HAND  Result Value Ref Range Status   Specimen Description   Final    BLOOD RIGHT HAND Performed at [facility], [address], [place], [place] [address]    Special Requests   Final    BOTTLES DRAWN AEROBIC AND ANAEROBIC Blood Culture results may not be optimal due to an inadequate volume of blood received in culture bottles Performed at [facility], [address] [place], [place] [address]    Culture   Final    NO GROWTH 5 DAYS Performed at [facility], [address] [place], [place] [address]    Report Status 04/27/2024 FINAL  Final  Culture, blood (Routine X 2) w Reflex to ID Panel     Status: None   Collection Time: 04/22/24  1:17 PM   Specimen: BLOOD RIGHT ARM  Result Value Ref Range Status   Specimen Description   Final    BLOOD RIGHT ARM Performed at [facility], [address] [place], [place] [address]    Special Requests   Final    BOTTLES DRAWN AEROBIC ONLY Blood Culture results may not be optimal due to an inadequate volume of blood received in culture bottles Performed at [facility], [address] [place], [place] [address]    Culture   Final    NO GROWTH 5 DAYS Performed at [facility], [address], [place], [place] [address]    Report Status 04/27/2024 FINAL  Final  Respiratory (~20 pathogens) panel by PCR     Status: None   Collection Time: 04/25/24  6:34 PM   Specimen: Nasopharyngeal Swab; Respiratory  Result Value Ref Range Status   Adenovirus NOT DETECTED NOT DETECTED Final   Coronavirus 229E NOT DETECTED NOT DETECTED Final    Comment: (NOTE) The Coronavirus on the Respiratory Panel, DOES NOT test for the novel  Coronavirus (2019 nCoV)    Coronavirus HKU1 NOT DETECTED NOT DETECTED Final   Coronavirus NL63 NOT DETECTED NOT DETECTED Final   Coronavirus OC43 NOT DETECTED NOT DETECTED Final   Metapneumovirus NOT DETECTED NOT DETECTED Final   Rhinovirus / Enterovirus NOT DETECTED NOT DETECTED Final   Influenza A NOT DETECTED NOT DETECTED Final   Influenza B NOT DETECTED NOT DETECTED Final   Parainfluenza Virus 1 NOT DETECTED NOT DETECTED Final   Parainfluenza Virus 2 NOT DETECTED NOT DETECTED Final   Parainfluenza Virus 3 NOT DETECTED NOT DETECTED Final   Parainfluenza Virus 4 NOT DETECTED NOT DETECTED Final   Respiratory  Syncytial Virus NOT DETECTED NOT DETECTED Final   Bordetella pertussis NOT DETECTED NOT DETECTED Final   Bordetella Parapertussis NOT DETECTED NOT DETECTED Final   Chlamydophila pneumoniae NOT  DETECTED NOT DETECTED Final   Mycoplasma pneumoniae NOT DETECTED NOT DETECTED Final    Comment: Performed at [facility], [address], [place], [place] [address]     Studies: DG CHEST PORT 1 VIEW Result Date: 04/22/2024 CLINICAL DATA:  Dyspnea EXAM: PORTABLE CHEST 1 VIEW COMPARISON:  Two days ago FINDINGS: Stable cardiomegaly. Right internal jugular Port-A-Cath is unchanged. Stable mild bibasilar interstitial densities concerning for possible chronic scarring and possible superimposed edema. Bony thorax is unremarkable. IMPRESSION: Stable mild bibasilar interstitial densities as described above. Electronically Signed   By: Lynwood Landy Raddle M.D.   On: 04/22/2024 12:40   DG Chest 2 View Result Date: 04/20/2024 EXAM: 2 VIEW(S) XRAY OF THE CHEST 04/20/2024 12:51:02 PM COMPARISON: Chest x-ray 03/05/2024, CXR 11/07/2023, CT chest 02/08/2024. CLINICAL HISTORY: chest pain, dyspnea, sickle cell FINDINGS: LINES, TUBES AND DEVICES: Right IJ Port-A-Cath stable in position. LUNGS AND PLEURA: Persistent prominent hilar vasculature and coarsened interstitial markings. Persistent Patchy airspace opacities of the lungs. No pleural effusion. No pneumothorax. HEART AND MEDIASTINUM: Mild cardiomegaly, unchanged. No acute abnormality of the mediastinal silhouette. BONES AND SOFT TISSUES: No acute osseous abnormality. IMPRESSION: 1. Mild pulmonary edema with underlying chronic coarsening interstitial markings and patchy airspace opacities. 2. Right IJ Port-A-Cath in stable position. Electronically signed by: Morgane Naveau MD 04/20/2024 01:40 PM EST RP Workstation: [id number]    Scheduled Meds:  sodium chloride    Intravenous Once   abacavir -dolutegravir -lamiVUDine   1 tablet Oral QHS   apixaban   5 mg Oral BID    Chlorhexidine  Gluconate Cloth  6 each Topical Daily   diphenhydrAMINE   25 mg Oral Once   folic acid   1 mg Oral Daily   hydroxyurea   2,000 mg Oral QHS   senna-docusate  1 tablet Oral BID   sodium chloride  flush  10-40 mL Intracatheter Q12H   sodium chloride  flush  10-40 mL Intracatheter Q12H   Continuous Infusions:  Principal Problem:   Sickle cell anemia with crisis (HCC) Active Problems:   HIV (human immunodeficiency virus infection) (HCC)   History of pulmonary embolism   Generalized anxiety disorder   GERD (gastroesophageal reflux disease)   Heart murmur   Chronic pain syndrome   Acute on chronic respiratory failure with hypoxia (HCC)   Anemia of chronic disease   AKI (acute kidney injury)   Chronic hypoxic respiratory failure (HCC)

## 2024-04-30 NOTE — Plan of Care (Signed)
" °  Problem: Education: Goal: Awareness of infection prevention will improve Outcome: Progressing   Problem: Tissue Perfusion: Goal: Complications related to inadequate tissue perfusion will be avoided or minimized Outcome: Progressing   Problem: Respiratory: Goal: Pulmonary complications will be avoided or minimized Outcome: Progressing Goal: Acute Chest Syndrome will be identified early to prevent complications Outcome: Progressing   Problem: Sensory: Goal: Pain level will decrease with appropriate interventions Outcome: Progressing   Problem: Education: Goal: Knowledge of General Education information will improve Description: Including pain rating scale, medication(s)/side effects and non-pharmacologic comfort measures Outcome: Progressing   Problem: Clinical Measurements: Goal: Ability to maintain clinical measurements within normal limits will improve Outcome: Progressing Goal: Will remain free from infection Outcome: Progressing Goal: Diagnostic test results will improve Outcome: Progressing Goal: Respiratory complications will improve Outcome: Progressing Goal: Cardiovascular complication will be avoided Outcome: Progressing   Problem: Safety: Goal: Ability to remain free from injury will improve Outcome: Progressing   "

## 2024-05-01 ENCOUNTER — Other Ambulatory Visit (HOSPITAL_COMMUNITY): Payer: Self-pay

## 2024-05-01 DIAGNOSIS — D5701 Hb-SS disease with acute chest syndrome: Secondary | ICD-10-CM

## 2024-05-01 DIAGNOSIS — J9621 Acute and chronic respiratory failure with hypoxia: Secondary | ICD-10-CM

## 2024-05-01 LAB — CBC
HCT: 27.3 % — ABNORMAL LOW (ref 39.0–52.0)
Hemoglobin: 10 g/dL — ABNORMAL LOW (ref 13.0–17.0)
MCH: 33.7 pg (ref 26.0–34.0)
MCHC: 36.6 g/dL — ABNORMAL HIGH (ref 30.0–36.0)
MCV: 91.9 fL (ref 80.0–100.0)
Platelets: 288 K/uL (ref 150–400)
RBC: 2.97 MIL/uL — ABNORMAL LOW (ref 4.22–5.81)
RDW: 26.7 % — ABNORMAL HIGH (ref 11.5–15.5)
WBC: 9.9 K/uL (ref 4.0–10.5)
nRBC: 62 % — ABNORMAL HIGH (ref 0.0–0.2)

## 2024-05-01 MED ORDER — ORAL CARE MOUTH RINSE: 15.0000 mL | OROMUCOSAL | Status: DC | PRN

## 2024-05-01 MED ORDER — ROBAFEN DM 20-200 MG/20ML PO LIQD
10.0000 mL | ORAL | 0 refills | Status: AC | PRN
  Filled 2024-05-01: qty 118, 5d supply, fill #0

## 2024-05-01 MED ORDER — AMOXICILLIN-POT CLAVULANATE 875-125 MG PO TABS
1.0000 | ORAL_TABLET | Freq: Two times a day (BID) | ORAL | 0 refills | Status: AC
  Filled 2024-05-01: qty 10, 5d supply, fill #0

## 2024-05-01 NOTE — Progress Notes (Signed)
 Discharge med in a secure bag delivered to patient by this RN.  Patient verbalized an understanding that Robafen would need to be purchased OTC

## 2024-05-01 NOTE — Plan of Care (Signed)
  Problem: Clinical Measurements: Goal: Will remain free from infection Outcome: Progressing Goal: Diagnostic test results will improve Outcome: Progressing   Problem: Activity: Goal: Risk for activity intolerance will decrease Outcome: Progressing   Problem: Elimination: Goal: Will not experience complications related to bowel motility Outcome: Progressing Goal: Will not experience complications related to urinary retention Outcome: Progressing   Problem: Pain Managment: Goal: General experience of comfort will improve and/or be controlled Outcome: Progressing   Problem: Safety: Goal: Ability to remain free from injury will improve Outcome: Progressing

## 2024-05-01 NOTE — Progress Notes (Signed)
 Reviewed AVS with patient, left via transfer chair to discharge lounge with NT in no acute distress

## 2024-05-01 NOTE — Discharge Summary (Signed)
 Physician Discharge Summary  Martin [name] FMW:[id number] DOB: 1991-03-19 DOA: 04/20/2024  PCP: [name], [name], [name]  Admit date: 04/20/2024  Discharge date: 05/01/2024  Discharge Diagnoses:  Principal Problem:   Sickle cell anemia with crisis [facility]) Active Problems:   Generalized anxiety disorder   GERD (gastroesophageal reflux disease)   HIV (human immunodeficiency virus infection) (HCC)   Heart murmur   Chronic pain syndrome   History of pulmonary embolism   Acute on chronic respiratory failure with hypoxia (HCC)   Anemia of chronic disease   AKI (acute kidney injury)   Chronic hypoxic respiratory failure (HCC)   Discharge Condition: Stable  Disposition:   Follow-up Information     [name], [name], [name] Follow up in 1 week(s).   Specialty: Nurse Practitioner Contact information: [address] [place] [address] [phone]                Pt is discharged home in good condition and is to follow up with [name], [name], [name] this week to have labs evaluated. Martin [name] is instructed to increase activity slowly and balance with rest for the next few days, and use prescribed medication to complete treatment of pain  Diet: Regular Wt Readings from Last 3 Encounters:  04/20/24 63.5 kg  04/15/24 63.5 kg  04/10/24 63.5 kg    History of present illness:  Martin [name]  is a 34 y.o. male  with history of sickle cell disease, history of acute chest syndrome, multiple sickle cell crisis, multiple ED visits and hospital admission, history of PE currently on Eliquis , chronic hypoxic respiratory failure on home oxygen , HIV infection and generalized anxiety disorder who presented to the emergency department with major complaints of generalized body pain, shortness of breath, and chest pain that is consistent with his typical sickle cell pain crisis.  He last took his home pain medication this morning with no resolution to  pain symptoms.  Patient is afebrile, denies nausea, vomiting, diarrhea.  No headaches, dizziness or fainting.  Denies recent sick contacts, no urinary symptoms.     ED course: BP 109/86   Pulse (!) 101   Temp 98.4 F (36.9 C) (Oral)   Resp 20   Ht 6' 3 (1.905 m)   Wt 63.5 kg   SpO2 90%   BMI 17.50 kg/m reticulocyte count 5-9.5, RBC 2.50, hemoglobin 9.0, WBC 9.5.  Chest x-ray showed:Mild pulmonary edema with underlying chronic coarsening interstitial markings and patchy airspace opacities. Patient was treated with IV fluid, IV pain medication with no resolution to pain symptoms.  Patient was therefore admitted for sickle cell pain crisis.  Hospital Course:  Patient was admitted for sickle cell pain crisis and managed appropriately with IVF, IV Dilaudid  via PCA and IV Toradol , as well as other adjunct therapies per sickle cell pain management protocols.  Patient also had acute on chronic respiratory failure with hypoxia which responded well to oxygen  therapy, although initially required up to 6 L/min saturating around 86% but improved significantly with therapies including incentive spirometry.  Oxygen  was successfully weaned to his baseline of 2 to 3 L at home.  Hemoglobin remains stable but because of persistent sickle cell pain crisis, patient underwent simple exchange transfusion with removal of about 700 cc and transfusion with 1 units of packed red blood cells.  Patient tolerated this procedure very well.  He was also found to have a very low CD4 count at 22, infectious disease specialist, Dr. Annalee Orem, advised  that patient followed up after discharge.  Patient was continued on his home medications including Eliquis  for pulmonary embolism.  He was empirically treated with antibiotics for possible community-acquired pneumonia versus acute chest syndrome while on admission.  Patient was successfully weaned off of Dilaudid  via PCA and transitioned to his home medications.  As of today,  patient's general condition has improved, he is now at his baseline.  Pain has returned to baseline, hemoglobin is stable, no more shortness of breath, saturating well on 2 L of oxygen  which is his baseline.  Patient has no further clinical indication for continued inpatient stay.  He requested to be discharged home.  He is ambulating well with no significant pain and tolerating p.o. intake with no restrictions.  Patient was therefore discharged home today in a hemodynamically stable condition.   Martin [name] will follow-up with PCP within 1 week of this discharge. Martin [name] was counseled extensively about nonpharmacologic means of pain management, patient verbalized understanding and was appreciative of  the care received during this admission.   We discussed the need for good hydration, monitoring of hydration status, avoidance of heat, cold, stress, and infection triggers. We discussed the need to be adherent with taking Hydrea  and other home medications. Patient was reminded of the need to seek medical attention immediately if any symptom of bleeding, anemia, or infection occurs.  Discharge Exam: Vitals:   04/30/24 2215 05/01/24 0607  BP: 109/72 99/73  Pulse: (!) 103 89  Resp: 15 14  Temp: 98.4 F (36.9 C) 98.4 F (36.9 C)  SpO2: 91% 92%   Vitals:   04/30/24 1039 04/30/24 1411 04/30/24 2215 05/01/24 0607  BP: 109/77 113/78 109/72 99/73  Pulse: 96 81 (!) 103 89  Resp: 16 16 15 14   Temp: 97.9 F (36.6 C) 97.9 F (36.6 C) 98.4 F (36.9 C) 98.4 F (36.9 C)  TempSrc: Oral Oral Oral Oral  SpO2: (!) 81% 94% 91% 92%  Weight:      Height:        General appearance : Awake, alert, not in any distress. Speech Clear. Not toxic looking HEENT: Atraumatic and Normocephalic, pupils equally reactive to light and accomodation Neck: Supple, no JVD. No cervical lymphadenopathy.  Chest: Good air entry bilaterally, no added sounds  CVS: S1 S2 regular, no murmurs.  Abdomen: Bowel sounds present,  Non tender and not distended with no gaurding, rigidity or rebound. Extremities: B/L Lower Ext shows no edema, both legs are warm to touch Neurology: Awake alert, and oriented X 3, CN II-XII intact, Non focal Skin: No Rash  Discharge Instructions  Discharge Instructions     Increase activity slowly   Complete by: As directed       Allergies as of 05/01/2024       Reactions   Ketorolac  Tromethamine  Swelling, Other (See Comments)   Patient reports facial edema and left arm edema after administration.   Tape Other (See Comments), Rash, Dermatitis   PLEASE DO NOT USE THE CLEAR, THICK, PLASTIC TAPE- only paper tape is tolerated Paper Tape = ok   Ketorolac  Other (See Comments), Swelling   Patient reports facial edema and left arm edema after administration.   Wound Dressing Adhesive Rash   Bactoshield Chg [chlorhexidine  Gluconate] Other (See Comments)   Per patient it makes me breakout        Medication List     STOP taking these medications    guaiFENesin  600 MG 12 hr tablet Commonly known as: Mucinex   TAKE these medications    albuterol  108 (90 Base) MCG/ACT inhaler Commonly known as: VENTOLIN  HFA Inhale 2 puffs into the lungs every 6 (six) hours as needed for wheezing or shortness of breath.   amoxicillin -clavulanate 875-125 MG tablet Commonly known as: AUGMENTIN  Take 1 tablet by mouth 2 (two) times daily for 5 days.   cyclobenzaprine  5 MG tablet Commonly known as: FLEXERIL  Take 1 tablet (5 mg total) by mouth 2 (two) times daily as needed for muscle spasms.   Deferasirox  360 MG Tabs Commonly known as: Jadenu  Take 2 tablets (720 mg total) by mouth daily.   diphenhydrAMINE  25 MG tablet Commonly known as: BENADRYL  Take 1 tablet (25 mg total) by mouth every 4 (four) hours as needed for itching. What changed: when to take this   Eliquis  5 MG Tabs tablet Generic drug: apixaban  Take 5 mg by mouth 2 (two) times daily.   folic acid  400 MCG  tablet Commonly known as: FOLVITE  Take 1 tablet (400 mcg total) by mouth daily.   gabapentin  300 MG capsule Commonly known as: NEURONTIN  Take 1 capsule (300 mg total) by mouth at bedtime as needed (nerve pain).   guaiFENesin -dextromethorphan  100-10 MG/5ML syrup Commonly known as: ROBITUSSIN DM Take 10 mLs by mouth every 4 (four) hours as needed for cough. What changed: Another medication with the same name was added. Make sure you understand how and when to take each.   guaiFENesin -dextromethorphan  100-10 MG/5ML syrup Commonly known as: ROBITUSSIN DM Take 10 mLs by mouth every 4 (four) hours as needed for cough (chest congestion). What changed: You were already taking a medication with the same name, and this prescription was added. Make sure you understand how and when to take each.   hydroxyurea  500 MG capsule Commonly known as: HYDREA  Take 4 capsules (2,000 mg total) by mouth at bedtime.   Narcan  4 MG/0.1ML Liqd nasal spray kit Generic drug: naloxone  Place 1 spray into the nose as needed (respiratory distress / opioid overdose).   oxyCODONE -acetaminophen  10-325 MG tablet Commonly known as: PERCOCET Take 1 tablet by mouth every 4 (four) hours as needed for pain.   pantoprazole  40 MG tablet Commonly known as: PROTONIX  Take 1 tablet (40 mg total) by mouth daily as needed (acid reflux).   senna-docusate 8.6-50 MG tablet Commonly known as: Senokot-S Take 2 tablets by mouth at bedtime.   Triumeq  600-50-300 MG tablet Generic drug: abacavir -dolutegravir -lamiVUDine  Take 1 tablet by mouth daily. What changed: when to take this   Vitamin D3 125 MCG (5000 UT) Caps Take 5,000 Units by mouth every Tuesday.        The results of significant diagnostics from this hospitalization (including imaging, microbiology, ancillary and laboratory) are listed below for reference.    Significant Diagnostic Studies: DG CHEST PORT 1 VIEW Result Date: 04/22/2024 CLINICAL DATA:  Dyspnea EXAM:  PORTABLE CHEST 1 VIEW COMPARISON:  Two days ago FINDINGS: Stable cardiomegaly. Right internal jugular Port-A-Cath is unchanged. Stable mild bibasilar interstitial densities concerning for possible chronic scarring and possible superimposed edema. Bony thorax is unremarkable. IMPRESSION: Stable mild bibasilar interstitial densities as described above. Electronically Signed   By: Lynwood Landy Raddle M.D.   On: 04/22/2024 12:40   DG Chest 2 View Result Date: 04/20/2024 EXAM: 2 VIEW(S) XRAY OF THE CHEST 04/20/2024 12:51:02 PM COMPARISON: Chest x-ray 03/05/2024, CXR 11/07/2023, CT chest 02/08/2024. CLINICAL HISTORY: chest pain, dyspnea, sickle cell FINDINGS: LINES, TUBES AND DEVICES: Right IJ Port-A-Cath stable in position. LUNGS AND PLEURA: Persistent prominent hilar vasculature  and coarsened interstitial markings. Persistent Patchy airspace opacities of the lungs. No pleural effusion. No pneumothorax. HEART AND MEDIASTINUM: Mild cardiomegaly, unchanged. No acute abnormality of the mediastinal silhouette. BONES AND SOFT TISSUES: No acute osseous abnormality. IMPRESSION: 1. Mild pulmonary edema with underlying chronic coarsening interstitial markings and patchy airspace opacities. 2. Right IJ Port-A-Cath in stable position. Electronically signed by: Morgane Naveau MD 04/20/2024 01:40 PM EST RP Workstation: [id number]    Microbiology: Recent Results (from the past 240 hours)  Culture, blood (Routine X 2) w Reflex to ID Panel     Status: None   Collection Time: 04/22/24  1:16 PM   Specimen: BLOOD RIGHT HAND  Result Value Ref Range Status   Specimen Description   Final    BLOOD RIGHT HAND Performed at [facility], [address], [place], [place] [address]    Special Requests   Final    BOTTLES DRAWN AEROBIC AND ANAEROBIC Blood Culture results may not be optimal due to an inadequate volume of blood received in culture bottles Performed at [facility], [address], [place],  [place] [address]    Culture   Final    NO GROWTH 5 DAYS Performed at [facility], [address] [place], [place] [address]    Report Status 04/27/2024 FINAL  Final  Culture, blood (Routine X 2) w Reflex to ID Panel     Status: None   Collection Time: 04/22/24  1:17 PM   Specimen: BLOOD RIGHT ARM  Result Value Ref Range Status   Specimen Description   Final    BLOOD RIGHT ARM Performed at [facility], [address], [place], [place] [address]    Special Requests   Final    BOTTLES DRAWN AEROBIC ONLY Blood Culture results may not be optimal due to an inadequate volume of blood received in culture bottles Performed at [facility], [address], [place], [place] [address]    Culture   Final    NO GROWTH 5 DAYS Performed at [facility], [address], [place], [place] [address]    Report Status 04/27/2024 FINAL  Final  Respiratory (~20 pathogens) panel by PCR     Status: None   Collection Time: 04/25/24  6:34 PM   Specimen: Nasopharyngeal Swab; Respiratory  Result Value Ref Range Status   Adenovirus NOT DETECTED NOT DETECTED Final   Coronavirus 229E NOT DETECTED NOT DETECTED Final    Comment: (NOTE) The Coronavirus on the Respiratory Panel, DOES NOT test for the novel  Coronavirus (2019 nCoV)    Coronavirus HKU1 NOT DETECTED NOT DETECTED Final   Coronavirus NL63 NOT DETECTED NOT DETECTED Final   Coronavirus OC43 NOT DETECTED NOT DETECTED Final   Metapneumovirus NOT DETECTED NOT DETECTED Final   Rhinovirus / Enterovirus NOT DETECTED NOT DETECTED Final   Influenza A NOT DETECTED NOT DETECTED Final   Influenza B NOT DETECTED NOT DETECTED Final   Parainfluenza Virus 1 NOT DETECTED NOT DETECTED Final   Parainfluenza Virus 2 NOT DETECTED NOT DETECTED Final   Parainfluenza Virus 3 NOT DETECTED NOT DETECTED Final   Parainfluenza Virus 4 NOT DETECTED NOT DETECTED Final   Respiratory Syncytial Virus NOT DETECTED NOT DETECTED Final   Bordetella  pertussis NOT DETECTED NOT DETECTED Final   Bordetella Parapertussis NOT DETECTED NOT DETECTED Final   Chlamydophila pneumoniae NOT DETECTED NOT DETECTED Final   Mycoplasma pneumoniae NOT DETECTED NOT DETECTED Final  Comment: Performed at [facility], [address] [place], [place] [address]     Labs: Basic Metabolic Panel: No results for input(s): NA, K, CL, CO2, GLUCOSE, BUN, CREATININE, CALCIUM , MG, PHOS in the last 168 hours. Liver Function Tests: No results for input(s): AST, ALT, ALKPHOS, BILITOT, PROT, ALBUMIN  in the last 168 hours. No results for input(s): LIPASE, AMYLASE in the last 168 hours. No results for input(s): AMMONIA in the last 168 hours. CBC: Recent Labs  Lab 04/26/24 0540 04/27/24 0509 04/29/24 0412 04/30/24 0610 05/01/24 0532  WBC  --   --  7.7 8.3 9.9  HGB 10.0* 10.2* 10.1* 10.1* 10.0*  HCT 27.1* 28.6* 25.1* 28.0* 27.3*  MCV  --   --  89.3 91.2 91.9  PLT  --   --  253 311 288   Cardiac Enzymes: No results for input(s): CKTOTAL, CKMB, CKMBINDEX, TROPONINI in the last 168 hours. BNP: Invalid input(s): POCBNP CBG: No results for input(s): GLUCAP in the last 168 hours.  Time coordinating discharge: 50 minutes  Signed:  Tamrah Victorino  [facility] 05/01/2024, 1:24 PM

## 2024-05-02 ENCOUNTER — Telehealth: Payer: Self-pay

## 2024-05-02 NOTE — Patient Instructions (Addendum)
 Martin [name] - I am sorry I was unable to reach you today for our scheduled appointment. I work with [name], [name], [name] and am calling to support your healthcare needs. Please contact me at [phone] at your earliest convenience. I have rescheduled our initial call for 05/05/2024 @1pm  over the phone. I look forward to speaking with you soon.   Thank you,  Elida Pulse, RNCM Case Manager [facility], Population Health Direct Dial: [phone]

## 2024-05-02 NOTE — Transitions of Care (Post Inpatient/ED Visit) (Signed)
" ° °  05/02/2024  Name: Martin [name] MRN: [id number] DOB: 1991/03/31  Today's TOC FU Call Status: Today's TOC FU Call Status:: Unsuccessful Call (1st Attempt) Unsuccessful Call (1st Attempt) Date: 05/02/24  Attempted to reach the patient regarding the most recent Inpatient/ED visit.  Follow Up Plan: Additional outreach attempts will be made to reach the patient to complete the Transitions of Care (Post Inpatient/ED visit) call.   Arvin Seip RN, BSN, CCM [employer], Population Health Case Manager Phone: [phone]  "

## 2024-05-03 ENCOUNTER — Other Ambulatory Visit (HOSPITAL_COMMUNITY): Payer: Self-pay

## 2024-05-03 ENCOUNTER — Telehealth: Payer: Self-pay

## 2024-05-03 NOTE — Transitions of Care (Post Inpatient/ED Visit) (Signed)
" ° °  05/03/2024  Name: Martin [name] MRN: [id number] DOB: [birth year]  Today's TOC FU Call Status: Today's TOC FU Call Status:: Unsuccessful Call (2nd Attempt) Unsuccessful Call (2nd Attempt) Date: 05/03/24  Attempted to reach the patient regarding the most recent Inpatient/ED visit.  Follow Up Plan: Additional outreach attempts will be made to reach the patient to complete the Transitions of Care (Post Inpatient/ED visit) call.   Shona Prow RN, CCM [name]  [facility]-Population Health RN Care Manager [phone]  "

## 2024-05-04 ENCOUNTER — Telehealth: Payer: Self-pay

## 2024-05-04 NOTE — Transitions of Care (Post Inpatient/ED Visit) (Signed)
" ° °  05/04/2024  Name: Martin [name] MRN: [id number] DOB: 11/09/1990  Today's TOC FU Call Status: Today's TOC FU Call Status:: Unsuccessful Call (3rd Attempt) Unsuccessful Call (3rd Attempt) Date: 05/04/24  Attempted to reach the patient regarding the most recent Inpatient/ED visit.  Follow Up Plan: No further outreach attempts will be made at this time. We have been unable to contact the patient.  Arvin Seip RN, BSN, CCM [employer], Population Health Case Manager Phone: [phone]  "

## 2024-05-05 ENCOUNTER — Telehealth: Payer: Self-pay

## 2024-05-05 NOTE — Patient Instructions (Signed)
 Ebert Narvis Forte - I am sorry I was unable to reach you today for our scheduled appointment. I work with [name], [name], [name] and am calling to support your healthcare needs. Please contact me at [phone] at your earliest convenience. I rescheduled this visit for 05/15/2024 @ 10:00. I look forward to speaking with you soon.   Thank you,  Elida Pulse, RNCM Case Manager [facility], Population Health Direct Dial: [phone]

## 2024-05-08 ENCOUNTER — Telehealth: Payer: Self-pay

## 2024-05-08 ENCOUNTER — Encounter: Payer: Self-pay | Admitting: Nurse Practitioner

## 2024-05-08 ENCOUNTER — Ambulatory Visit: Payer: Self-pay | Admitting: Nurse Practitioner

## 2024-05-08 ENCOUNTER — Other Ambulatory Visit (HOSPITAL_COMMUNITY): Payer: Self-pay

## 2024-05-08 ENCOUNTER — Other Ambulatory Visit: Payer: Self-pay

## 2024-05-08 VITALS — BP 109/76 | HR 88 | Wt 153.0 lb

## 2024-05-08 DIAGNOSIS — Z86711 Personal history of pulmonary embolism: Secondary | ICD-10-CM

## 2024-05-08 DIAGNOSIS — Z21 Asymptomatic human immunodeficiency virus [HIV] infection status: Secondary | ICD-10-CM | POA: Diagnosis not present

## 2024-05-08 DIAGNOSIS — I509 Heart failure, unspecified: Secondary | ICD-10-CM | POA: Insufficient documentation

## 2024-05-08 DIAGNOSIS — Z13220 Encounter for screening for lipoid disorders: Secondary | ICD-10-CM | POA: Insufficient documentation

## 2024-05-08 DIAGNOSIS — D571 Sickle-cell disease without crisis: Secondary | ICD-10-CM | POA: Diagnosis not present

## 2024-05-08 DIAGNOSIS — E559 Vitamin D deficiency, unspecified: Secondary | ICD-10-CM

## 2024-05-08 DIAGNOSIS — T8089XA Other complications following infusion, transfusion and therapeutic injection, initial encounter: Secondary | ICD-10-CM

## 2024-05-08 DIAGNOSIS — Z5941 Food insecurity: Secondary | ICD-10-CM | POA: Diagnosis not present

## 2024-05-08 DIAGNOSIS — Z0289 Encounter for other administrative examinations: Secondary | ICD-10-CM | POA: Insufficient documentation

## 2024-05-08 DIAGNOSIS — Z113 Encounter for screening for infections with a predominantly sexual mode of transmission: Secondary | ICD-10-CM | POA: Diagnosis not present

## 2024-05-08 MED ORDER — FOLIC ACID 1 MG PO TABS
1.0000 mg | ORAL_TABLET | Freq: Every day | ORAL | 1 refills | Status: DC
  Filled 2024-05-08: qty 90, 90d supply, fill #0

## 2024-05-08 MED ORDER — VITAMIN D (ERGOCALCIFEROL) 1.25 MG (50000 UNIT) PO CAPS
50000.0000 [IU] | ORAL_CAPSULE | ORAL | 2 refills | Status: DC
  Filled 2024-05-08: qty 8, 56d supply, fill #0

## 2024-05-08 MED ORDER — VITAMIN D (ERGOCALCIFEROL) 1.25 MG (50000 UNIT) PO CAPS: 50000.0000 [IU] | ORAL_CAPSULE | ORAL | 2 refills | Status: AC

## 2024-05-08 MED ORDER — DEFERASIROX 360 MG PO TABS
720.0000 mg | ORAL_TABLET | Freq: Every day | ORAL | 3 refills | Status: DC
  Filled 2024-05-08: qty 60, 30d supply, fill #0

## 2024-05-08 MED ORDER — DEFERASIROX 360 MG PO TABS: 720.0000 mg | ORAL_TABLET | Freq: Every day | ORAL | 3 refills | Status: AC

## 2024-05-08 MED ORDER — TRIUMEQ 600-50-300 MG PO TABS
1.0000 | ORAL_TABLET | Freq: Every day | ORAL | 3 refills | Status: DC
  Filled 2024-05-08: qty 30, 30d supply, fill #0

## 2024-05-08 MED ORDER — TRIUMEQ 600-50-300 MG PO TABS: 1.0000 | ORAL_TABLET | Freq: Every day | ORAL | 3 refills | Status: AC

## 2024-05-08 MED ORDER — FOLIC ACID 1 MG PO TABS: 1.0000 mg | ORAL_TABLET | Freq: Every day | ORAL | 1 refills | Status: AC

## 2024-05-08 NOTE — Patient Instructions (Addendum)
 Please call cardiology office to schedule an appointment [facility] at [facility] [address] [place],  [place]  [address] Main: [phone]   It is important that you exercise regularly at least 30 minutes 5 times a week as tolerated  Think about what you will eat, plan ahead. Choose  clean, green, fresh or frozen over canned, processed or packaged foods which are more sugary, salty and fatty. 70 to 75% of food eaten should be vegetables and fruit. Three meals at set times with snacks allowed between meals, but they must be fruit or vegetables. Aim to eat over a 12 hour period , example 7 am to 7 pm, and STOP after  your last meal of the day. Drink water,generally about 64 ounces per day, no other drink is as healthy. Fruit juice is best enjoyed in a healthy way, by EATING the fruit.  Thanks for choosing [facility] we consider it a privelige to serve you.

## 2024-05-08 NOTE — Assessment & Plan Note (Signed)
" °  Lipid panel not performed due to non-fasting. Direct LDL checked. - Encouraged fasting lipid panel at a future visit. "

## 2024-05-08 NOTE — Assessment & Plan Note (Signed)
" °   Cardiologist follow-up pending. - Provided cardiologist contact information for rescheduling follow-up.  The importance of following up with cardiology discussed  "

## 2024-05-08 NOTE — Telephone Encounter (Signed)
 Martin [name] is unable to dispense the prescriptions for Triumeq  and Jadenu  because the patient is enrolled with [place]  Medicaid, and our pharmacy is not contracted with [place] plans.

## 2024-05-08 NOTE — Assessment & Plan Note (Signed)
 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 Not taking Jardiance due to insurance issues. Applying for Medicaid. - Encouraged application for Medicaid to address insurance issues. - Discussed the importance of managing iron overload to prevent liver disease.

## 2024-05-08 NOTE — Assessment & Plan Note (Signed)
 Pain management contract signed today

## 2024-05-08 NOTE — Assessment & Plan Note (Addendum)
 Human immunodeficiency virus (HIV) infection Not taking Trimeric post-hospitalization due to lack of medication. Awaiting follow-up with infectious disease specialist. - Refilled Trimeric prescription.but Martin [name] is unable to dispense the prescriptions for Triumeq  and  Jadenubecause the patient is enrolled with [place]  Medicaid, and [facility] is not contracted with [place] Medicaid plans. Patient requested that medication refills be sent to his pharmacy in [place] and that his parents will pick up the medications for him - Encouraged follow-up with infectious disease specialist at [facility] health, appointment scheduled

## 2024-05-08 NOTE — Assessment & Plan Note (Addendum)
 Last vitamin D  Lab Results  Component Value Date   VD25OH 6.5 (L) 02/04/2024   Vitamin D  deficiency Vitamin D  level very low. Not taking supplements. Recommended high-dose supplementation. - Encouraged taking 50,000 units of vitamin D  weekly if available. - If high-dose vitamin D  is unavailable, consider over-the-counter vitamin D  supplements.

## 2024-05-08 NOTE — Progress Notes (Signed)
 "  Established Patient Office Visit  Subjective:  Patient ID: Martin [name], male    DOB: [birth year]  Age: 34 y.o. MRN: [id number]  CC:  Chief Complaint  Patient presents with   Annual Exam    HPI   Discussed the use of AI scribe software for clinical note transcription with the patient, who gave verbal consent to proceed.  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  Was on  admission for sickle cell crisis from 04/21/2023 to 05/02/2023, patient underwent simple exchange transfusion with removal of about 700 cc and transfusion with 1 units of packed red blood cells.  He was also found to have a very low CD4 count at 22, infectious disease specialist, Dr. Annalee Orem, advised that patient followed up after discharge.  SABRA  He was empirically treated with antibiotics for possible community-acquired pneumonia versus acute chest syndrome while on admission.   He has been experiencing persistent chest pain since his recent twelve-day hospitalization, where it was identified as acute chest syndrome. The pain is primarily located in the shoulder area rather than the chest. No fever, chills, cough, drainage, or redness in the area. He experiences shortness of breath but no other respiratory symptoms.   His current medications include Percocet 10/325 mg, one tablet every four hours as needed for pain, with the last dose taken two hours prior to the visit. He also takes hydroxyurea , four tablets daily, and Eliquis  5 mg twice daily. He has not been taking folic acid  or pantoprazole  regularly. He takes Trimeric daily and gabapentin  as needed. He has not been taking Jadenu  due to lack of insurance coverage but he [place], recently [place]   He has been out of triumeq   was receiving medication from a free clinic in [place]  and is currently trying to resolve insurance  issues to continue receiving necessary medications.  The nurse  assisted him with scheduling an appointment with the infectious disease specialist today.  Stated that he was told that they would not accept his [place]  insurance but were able to schedule an appointment for him today.   He has not been taking Jadenu  for iron overload due to insurance issues and is awaiting Medicaid application results.  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 He reports no issues with constipation and maintains hydration. He has not received a flu vaccine this year but is up to date with his tetanus vaccine. He is unsure about his pneumonia vaccine status.  He has seen an eye doctor within the past twelve months and reports no retinopathy. He is currently not experiencing any stomach pain.     Assessment & Plan      Past Medical History:  Diagnosis Date   Anxiety    Chest pain 02/04/2024   HIV (human immunodeficiency virus infection) (HCC)    Proteinuria    Sickle cell disease (HCC)    Vitamin D  deficiency 10/2018    Past Surgical History:  Procedure Laterality Date   IR IMAGING GUIDED PORT INSERTION  08/29/2019   IR REMOVAL TUN ACCESS W/ PORT W/O FL MOD SED  08/30/2020   TEE WITHOUT CARDIOVERSION N/A 08/30/2020   Procedure: TRANSESOPHAGEAL ECHOCARDIOGRAM (TEE);  Surgeon: Mona Vinie BROCKS, MD;  Location[facility] ENDOSCOPY;  Service: Cardiovascular;  Laterality: N/A;    Family  History  Problem Relation Age of Onset   Sickle cell trait Mother    Sickle cell trait Father    Birth defects Maternal Grandmother    Birth defects Paternal Grandmother     Social History   Socioeconomic History   Marital status: Single    Spouse name: Not on file   Number of children: Not on file   Years of education: Not on file   Highest education level: 12th grade  Occupational History   Occupation: disabled  Tobacco Use   Smoking  status: Never   Smokeless tobacco: Never  Vaping Use   Vaping status: Never Used  Substance and Sexual Activity   Alcohol  use: No   Drug use: Not Currently    Types: Marijuana    Comment: 2x week   Sexual activity: Yes    Birth control/protection: Condom  Other Topics Concern   Not on file  Social History Narrative   Lives with his siblings.    Social Drivers of Health   Tobacco Use: Low Risk (05/08/2024)   Patient History    Smoking Tobacco Use: Never    Smokeless Tobacco Use: Never    Passive Exposure: Not on file  Financial Resource Strain: Low Risk (02/21/2024)   Overall Financial Resource Strain (CARDIA)    Difficulty of Paying Living Expenses: Not very hard  Food Insecurity: Food Insecurity Present (05/08/2024)   Epic    Worried About Programme Researcher, Broadcasting/film/video in the Last Year: Sometimes true    Ran Out of Food in the Last Year: Sometimes true  Transportation Needs: No Transportation Needs (05/08/2024)   Epic    Lack of Transportation (Medical): No    Lack of Transportation (Non-Medical): No  Physical Activity: Sufficiently Active (10/09/2023)   [facility]   Exercise Vital Sign    On average, how many days per week do you engage in moderate to strenuous exercise (like a brisk walk)?: 7 days    On average, how many minutes do you engage in exercise at this level?: 60 min  Stress: No Stress Concern Present (10/09/2023)   Received from [facility] - Occupational Stress Questionnaire    Feeling of Stress : Not at all  Social Connections: Socially Isolated (03/04/2024)   Social Connection and Isolation Panel    Frequency of Communication with Friends and Family: More than three times a week    Frequency of Social Gatherings with Friends and Family: Twice a week    Attends Religious Services: Never    Database Administrator or Organizations: No    Attends Banker Meetings: Never    Marital Status:  Never married  Intimate Partner Violence: Not At Risk (05/08/2024)   Epic    Fear of Current or Ex-Partner: No    Emotionally Abused: No    Physically Abused: No    Sexually Abused: No  Depression (PHQ2-9): Low Risk (05/08/2024)   Depression (PHQ2-9)    PHQ-2 Score: 0  Alcohol  Screen: Not on file  Housing: Low Risk (05/08/2024)   Epic    Unable to Pay for Housing in the Last Year: No    Number of Times Moved in the Last Year: 0    Homeless in the Last Year: No  Recent Concern: Housing - High Risk (04/20/2024)   Epic    Unable to Pay for Housing in the Last Year: No    Number of Times Moved in  the Last Year: 0    Homeless in the Last Year: Yes  Utilities: Not At Risk (05/08/2024)   Epic    Threatened with loss of utilities: No  Health Literacy: Not on file    Outpatient Medications Prior to Visit  Medication Sig Dispense Refill   apixaban  (ELIQUIS ) 5 MG TABS tablet Take 5 mg by mouth 2 (two) times daily.     cyclobenzaprine  (FLEXERIL ) 5 MG tablet Take 1 tablet (5 mg total) by mouth 2 (two) times daily as needed for muscle spasms. 30 tablet 1   Dextromethorphan -guaiFENesin  (ROBAFEN DM) 20-200 MG/20ML LIQD Take 10 mLs by mouth every 4 (four) hours as needed (chest congestion). 118 mL 0   diphenhydrAMINE  (BENADRYL ) 25 MG tablet Take 1 tablet (25 mg total) by mouth every 4 (four) hours as needed for itching. 30 tablet 1   gabapentin  (NEURONTIN ) 300 MG capsule Take 1 capsule (300 mg total) by mouth at bedtime as needed (nerve pain). 30 capsule 3   guaiFENesin -dextromethorphan  (ROBITUSSIN DM) 100-10 MG/5ML syrup Take 10 mLs by mouth every 4 (four) hours as needed for cough. 480 mL 0   hydroxyurea  (HYDREA ) 500 MG capsule Take 4 capsules (2,000 mg total) by mouth at bedtime. 120 capsule 11   NARCAN  4 MG/0.1ML LIQD nasal spray kit Place 1 spray into the nose as needed (respiratory distress / opioid overdose).     oxyCODONE -acetaminophen  (PERCOCET) 10-325 MG tablet Take 1 tablet by mouth every 4  (four) hours as needed for pain. 90 tablet 0   pantoprazole  (PROTONIX ) 40 MG tablet Take 1 tablet (40 mg total) by mouth daily as needed (acid reflux). 30 tablet 2   abacavir -dolutegravir -lamiVUDine  (TRIUMEQ ) 600-50-300 MG tablet Take 1 tablet by mouth daily. 30 tablet 3   albuterol  (VENTOLIN  HFA) 108 (90 Base) MCG/ACT inhaler Inhale 2 puffs into the lungs every 6 (six) hours as needed for wheezing or shortness of breath. (Patient not taking: Reported on 05/08/2024) 6.7 g 2   senna-docusate (SENOKOT-S) 8.6-50 MG tablet Take 2 tablets by mouth at bedtime. (Patient not taking: Reported on 05/08/2024) 60 tablet 3   Cholecalciferol  (VITAMIN D3) 125 MCG (5000 UT) CAPS Take 5,000 Units by mouth every Tuesday. (Patient not taking: Reported on 05/08/2024)     Deferasirox  (JADENU ) 360 MG TABS Take 2 tablets (720 mg total) by mouth daily. (Patient not taking: Reported on 05/08/2024) 60 tablet 3   folic acid  (FOLVITE ) 400 MCG tablet Take 1 tablet (400 mcg total) by mouth daily. (Patient not taking: Reported on 05/08/2024) 30 tablet 5   No facility-administered medications prior to visit.    Allergies[1]  ROS Review of Systems  Constitutional:  Negative for appetite change, chills, fatigue and fever.  HENT:  Negative for congestion, postnasal drip and rhinorrhea.   Respiratory:  Negative for cough, shortness of breath and wheezing.   Cardiovascular:  Negative for chest pain, palpitations and leg swelling.  Gastrointestinal:  Negative for abdominal pain, constipation, nausea and vomiting.  Genitourinary:  Negative for difficulty urinating, dysuria, flank pain and frequency.  Musculoskeletal:  Positive for arthralgias. Negative for joint swelling and myalgias.  Skin:  Negative for color change, pallor and rash.  Neurological:  Negative for dizziness, facial asymmetry, weakness, numbness and headaches.  Psychiatric/Behavioral:  Negative for behavioral problems, confusion, self-injury and suicidal ideas.        Objective:    Physical Exam Vitals and nursing note reviewed.  Constitutional:      General: He is not in acute distress.  Appearance: Normal appearance. He is not ill-appearing, toxic-appearing or diaphoretic.  HENT:     Mouth/Throat:     Mouth: Mucous membranes are moist.     Pharynx: Oropharynx is clear. No oropharyngeal exudate or posterior oropharyngeal erythema.  Eyes:     General: Scleral icterus present.        Right eye: No discharge.        Left eye: No discharge.     Extraocular Movements: Extraocular movements intact.  Cardiovascular:     Rate and Rhythm: Normal rate and regular rhythm.     Pulses: Normal pulses.     Heart sounds: Normal heart sounds. No murmur heard.    No friction rub. No gallop.  Pulmonary:     Effort: Pulmonary effort is normal. No respiratory distress.     Breath sounds: Normal breath sounds. No stridor. No wheezing, rhonchi or rales.  Chest:     Chest wall: No tenderness.  Abdominal:     General: There is no distension.     Palpations: Abdomen is soft.     Tenderness: There is no abdominal tenderness. There is no right CVA tenderness, left CVA tenderness or guarding.  Musculoskeletal:        General: Tenderness present. No swelling, deformity or signs of injury.     Right lower leg: No edema.     Left lower leg: No edema.     Comments: Right side chest wall tenderness   Skin:    General: Skin is warm and dry.     Capillary Refill: Capillary refill takes less than 2 seconds.     Coloration: Skin is not jaundiced or pale.     Findings: No bruising, erythema or lesion.  Neurological:     Mental Status: He is alert and oriented to person, place, and time.     Motor: No weakness.     Gait: Gait normal.  Psychiatric:        Mood and Affect: Mood normal.        Behavior: Behavior normal.        Thought Content: Thought content normal.        Judgment: Judgment normal.     BP 109/76   Pulse 88   Wt 153 lb (69.4 kg)   SpO2 91%    BMI 19.12 kg/m  Wt Readings from Last 3 Encounters:  05/08/24 153 lb (69.4 kg)  04/20/24 139 lb 15.9 oz (63.5 kg)  04/15/24 139 lb 15.9 oz (63.5 kg)    No results found for: TSH Lab Results  Component Value Date   WBC 9.9 05/01/2024   HGB 10.0 (L) 05/01/2024   HCT 27.3 (L) 05/01/2024   MCV 91.9 05/01/2024   PLT 288 05/01/2024   Lab Results  Component Value Date   NA 136 04/24/2024   K 4.8 04/24/2024   CO2 22 04/24/2024   GLUCOSE 93 04/24/2024   BUN 26 (H) 04/24/2024   CREATININE 1.14 04/24/2024   BILITOT 12.1 (H) 04/24/2024   ALKPHOS 81 04/24/2024   AST 183 (H) 04/24/2024   ALT 138 (H) 04/24/2024   PROT 7.4 04/24/2024   ALBUMIN  3.6 04/24/2024   CALCIUM  8.5 (L) 04/24/2024   ANIONGAP 10 04/24/2024   EGFR 120 01/12/2023   No results found for: CHOL No results found for: HDL No results found for: LDLCALC No results found for: [name] No results found for: [employer] Lab Results  Component Value Date   HGBA1C (L) 05/22/2009    <  4.0 (NOTE) The ADA recommends the following therapeutic goal for glycemic control related to Hgb A1c measurement: Goal of therapy: <6.5 Hgb A1c  Reference: American Diabetes Association: Clinical Practice Recommendations 2010, Diabetes Care, 2010, 33: (Suppl  1).      Assessment & Plan:   Problem List Items Addressed This Visit       Cardiovascular and Mediastinum   Pulmonary hypertension, primary (HCC)   Congestive heart failure [facility])     Cardiologist follow-up pending. - Provided cardiologist contact information for rescheduling follow-up.  The importance of following up with cardiology discussed         Other   HIV (human immunodeficiency virus infection) (HCC)   Human immunodeficiency virus (HIV) infection Not taking Trimeric post-hospitalization due to lack of medication. Awaiting follow-up with infectious disease specialist. - Refilled Trimeric prescription.but [facility] is unable to dispense the prescriptions for  Triumeq  and  Jadenubecause the patient is enrolled with [place]  Medicaid, and [place] [facility] is not contracted with [place] plans. 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 Pain managed with Percocet and gabapentin . Flexeril  used for muscle spasms. Advised on sedation risks with concurrent medication use. - Continue Percocet 10/325 mg, 1 tablet every 4 hours as needed for pain. - Continue gabapentin  as needed for pain management. - Use Flexeril  as needed for muscle spasms, primarily at night. - Monitor for excessive sedation due to concurrent use of Percocet and muscle relaxants. - Ensure pain medication is stored securely. - Avoid triggers of infection and seek medical attention if signs of sickle cell crisis occur. - Stay warm to manage pain exacerbated by cold weather. Drink at least 64 ounces of water daily to maintain hydration       Relevant Medications   folic acid  (FOLVITE ) 1 MG tablet   Other Relevant Orders   Sickle Cell Panel   ToxAssure Flex 15, Ur   Transfusion hemosiderosis   Lab Results  Component Value Date   FERRITIN 2,279 (H) 02/04/2024   Transfusional iron overload Iron overload from transfusions poses liver disease risk. Not taking Jardiance due to insurance issues. Applying for Medicaid. - Encouraged application for Medicaid to address insurance issues. - Discussed the importance of managing iron overload to prevent liver disease.        Vitamin D  deficiency   Last vitamin D  Lab  Results  Component Value Date   VD25OH 6.5 (L) 02/04/2024   Vitamin D  deficiency Vitamin D  level very low. Not taking supplements. Recommended high-dose supplementation. - Encouraged taking 50,000 units of vitamin D  weekly if available. - If high-dose vitamin D  is unavailable, consider over-the-counter vitamin D  supplements.        Relevant Medications   Vitamin D , Ergocalciferol , (DRISDOL ) 1.25 MG (50000 UNIT) CAPS capsule   History of pulmonary embolism   Continue Eliquis  5 mg twice daily Advised to complete [facility]MS PAP Application form ,and mail as directed to ensure continued supply of medication      Iron overload, transfusional   Lab Results  Component Value Date   FERRITIN 2,279 (  H) 02/04/2024         Relevant Medications   Deferasirox  (JADENU ) 360 MG TABS   Screening for lipid disorders    Lipid panel not performed due to non-fasting. Direct LDL checked. - Encouraged fasting lipid panel at a future visit.      Relevant Orders   LDL Cholesterol, Direct   Pain management contract signed   Pain management contract signed today      Other Visit Diagnoses       Screen for STD (sexually transmitted disease)       Relevant Orders   Chlamydia/Gonococcus/Trichomonas, NAA   RPR   Hepatitis B surface antigen   Hepatitis C antibody     Food insecurity           Meds ordered this encounter  Medications   DISCONTD: Deferasirox  (JADENU ) 360 MG TABS    Sig: Take 2 tablets (720 mg total) by mouth daily.    Dispense:  60 tablet    Refill:  3    Put on file and d/c. [place]   DISCONTD: Vitamin D , Ergocalciferol , (DRISDOL ) 1.25 MG (50000 UNIT) CAPS capsule    Sig: Take 1 capsule (50,000 Units total) by mouth every 7 (seven) days.    Dispense:  8 capsule    Refill:  2   DISCONTD: folic acid  (FOLVITE ) 1 MG tablet    Sig: Take 1 tablet (1 mg total) by mouth daily.    Dispense:  90 tablet    Refill:  1   DISCONTD: abacavir -dolutegravir -lamiVUDine   (TRIUMEQ ) 600-50-300 MG tablet    Sig: Take 1 tablet by mouth daily.    Dispense:  30 tablet    Refill:  3    Put on file and d/c. [place]   abacavir -dolutegravir -lamiVUDine  (TRIUMEQ ) 600-50-300 MG tablet    Sig: Take 1 tablet by mouth daily.    Dispense:  60 tablet    Refill:  3    Put on file and d/c. [place]   Deferasirox  (JADENU ) 360 MG TABS    Sig: Take 2 tablets (720 mg total) by mouth daily.    Dispense:  60 tablet    Refill:  3    Put on file and d/c. [place]   folic acid  (FOLVITE ) 1 MG tablet    Sig: Take 1 tablet (1 mg total) by mouth daily.    Dispense:  90 tablet    Refill:  1   Vitamin D , Ergocalciferol , (DRISDOL ) 1.25 MG (50000 UNIT) CAPS capsule    Sig: Take 1 capsule (50,000 Units total) by mouth every 7 (seven) days.    Dispense:  8 capsule    Refill:  2    Follow-up: Return in about 3 months (around 08/06/2024) for SCD.    Kaetlyn Noa R Kameren Baade, [name]     [1]  Allergies Allergen Reactions   Ketorolac  Tromethamine  Swelling and Other (See Comments)    Patient reports facial edema and left arm edema after administration.   Tape Other (See Comments), Rash and Dermatitis    PLEASE DO NOT USE THE CLEAR, THICK, PLASTIC TAPE- only paper tape is tolerated  Paper Tape = ok   Ketorolac  Other (See Comments) and Swelling    Patient reports facial edema and left arm edema after administration.   Wound Dressing Adhesive Rash   Bactoshield Chg [Chlorhexidine  Gluconate] Other (See Comments)    Per patient it makes me breakout   "

## 2024-05-08 NOTE — Assessment & Plan Note (Signed)
 Lab Results  Component Value Date   FERRITIN 2,279 (H) 02/04/2024

## 2024-05-08 NOTE — Assessment & Plan Note (Addendum)
 Continue Eliquis  5 mg twice daily Advised to complete [employer] PAP Application form ,and mail as directed to ensure continued supply of medication

## 2024-05-08 NOTE — Assessment & Plan Note (Signed)
 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 Pain managed with Percocet and gabapentin . Flexeril  used for muscle spasms. Advised on sedation risks with concurrent medication use. - Continue Percocet 10/325 mg, 1 tablet every 4 hours as needed for pain. - Continue gabapentin  as needed for pain management. - Use Flexeril  as needed for muscle spasms, primarily at night. - Monitor for excessive sedation due to concurrent use of Percocet and muscle relaxants. - Ensure pain medication is stored securely. - Avoid triggers of infection and seek medical attention if signs of sickle cell crisis occur. - Stay warm to manage pain exacerbated by cold weather. Drink at least 64 ounces of water daily to maintain hydration

## 2024-05-09 ENCOUNTER — Ambulatory Visit: Payer: Self-pay | Admitting: Nurse Practitioner

## 2024-05-09 LAB — CMP14+CBC/D/PLT+FER+RETIC+V...
ALT: 25 IU/L (ref 0–44)
AST: 46 IU/L — ABNORMAL HIGH (ref 0–40)
Albumin: 3.6 g/dL — ABNORMAL LOW (ref 4.1–5.1)
Alkaline Phosphatase: 96 IU/L (ref 47–123)
BUN/Creatinine Ratio: 10 (ref 9–20)
BUN: 9 mg/dL (ref 6–20)
Basophils Absolute: 0.1 x10E3/uL (ref 0.0–0.2)
Basos: 1 %
Bilirubin Total: 7.2 mg/dL — ABNORMAL HIGH (ref 0.0–1.2)
CO2: 19 mmol/L — ABNORMAL LOW (ref 20–29)
Calcium: 8.8 mg/dL (ref 8.7–10.2)
Chloride: 110 mmol/L — ABNORMAL HIGH (ref 96–106)
Creatinine, Ser: 0.9 mg/dL (ref 0.76–1.27)
EOS (ABSOLUTE): 0.2 x10E3/uL (ref 0.0–0.4)
Eos: 2 %
Ferritin: 2514 ng/mL — ABNORMAL HIGH (ref 30–400)
Globulin, Total: 4.2 g/dL (ref 1.5–4.5)
Glucose: 81 mg/dL (ref 70–99)
Hematocrit: 31.3 % — ABNORMAL LOW (ref 37.5–51.0)
Hemoglobin: 10.6 g/dL — ABNORMAL LOW (ref 13.0–17.7)
Immature Grans (Abs): 0 x10E3/uL (ref 0.0–0.1)
Immature Granulocytes: 0 %
Lymphocytes Absolute: 4.8 x10E3/uL — ABNORMAL HIGH (ref 0.7–3.1)
Lymphs: 50 %
MCH: 34.3 pg — ABNORMAL HIGH (ref 26.6–33.0)
MCHC: 33.9 g/dL (ref 31.5–35.7)
MCV: 101 fL — ABNORMAL HIGH (ref 79–97)
Monocytes Absolute: 0.9 x10E3/uL (ref 0.1–0.9)
Monocytes: 10 %
NRBC: 18 % — ABNORMAL HIGH (ref 0–0)
Neutrophils Absolute: 3.5 x10E3/uL (ref 1.4–7.0)
Neutrophils: 37 %
Platelets: 232 x10E3/uL (ref 150–450)
Potassium: 5 mmol/L (ref 3.5–5.2)
RBC: 3.09 x10E6/uL — ABNORMAL LOW (ref 4.14–5.80)
RDW: 30.2 % — ABNORMAL HIGH (ref 11.6–15.4)
Retic Ct Pct: 11.8 % — ABNORMAL HIGH (ref 0.6–2.6)
Sodium: 145 mmol/L — ABNORMAL HIGH (ref 134–144)
Total Protein: 7.8 g/dL (ref 6.0–8.5)
Vit D, 25-Hydroxy: 4.3 ng/mL — ABNORMAL LOW (ref 30.0–100.0)
WBC: 9.5 x10E3/uL (ref 3.4–10.8)
eGFR: 115 mL/min/1.73

## 2024-05-09 LAB — HEPATITIS B SURFACE ANTIGEN: Hepatitis B Surface Ag: NEGATIVE

## 2024-05-09 LAB — SYPHILIS: RPR W/REFLEX TO RPR TITER AND TREPONEMAL ANTIBODIES, TRADITIONAL SCREENING AND DIAGNOSIS ALGORITHM: RPR Ser Ql: NONREACTIVE

## 2024-05-09 LAB — CHLAMYDIA/GONOCOCCUS/TRICHOMONAS, NAA
Chlamydia by NAA: NEGATIVE
Gonococcus by NAA: NEGATIVE
Trich vag by NAA: NEGATIVE

## 2024-05-09 LAB — HEPATITIS C ANTIBODY: Hep C Virus Ab: NONREACTIVE

## 2024-05-09 LAB — LDL CHOLESTEROL, DIRECT: LDL Direct: 87 mg/dL (ref 0–99)

## 2024-05-10 ENCOUNTER — Inpatient Hospital Stay (HOSPITAL_COMMUNITY)
Admission: EM | Admit: 2024-05-10 | Discharge: 2024-05-12 | DRG: 811 | Disposition: A | Discharge status: Home / Self Care | Attending: Internal Medicine | Admitting: Internal Medicine

## 2024-05-10 ENCOUNTER — Emergency Department (HOSPITAL_COMMUNITY)

## 2024-05-10 ENCOUNTER — Other Ambulatory Visit: Payer: Self-pay

## 2024-05-10 ENCOUNTER — Encounter (HOSPITAL_COMMUNITY): Payer: Self-pay | Admitting: Emergency Medicine

## 2024-05-10 DIAGNOSIS — D571 Sickle-cell disease without crisis: Secondary | ICD-10-CM

## 2024-05-10 DIAGNOSIS — Z91048 Other nonmedicinal substance allergy status: Secondary | ICD-10-CM

## 2024-05-10 DIAGNOSIS — Z5982 Transportation insecurity: Secondary | ICD-10-CM

## 2024-05-10 DIAGNOSIS — I2724 Chronic thromboembolic pulmonary hypertension: Secondary | ICD-10-CM | POA: Diagnosis present

## 2024-05-10 DIAGNOSIS — Z5941 Food insecurity: Secondary | ICD-10-CM

## 2024-05-10 DIAGNOSIS — D638 Anemia in other chronic diseases classified elsewhere: Secondary | ICD-10-CM | POA: Diagnosis present

## 2024-05-10 DIAGNOSIS — D5701 Hb-SS disease with acute chest syndrome: Secondary | ICD-10-CM

## 2024-05-10 DIAGNOSIS — Z8249 Family history of ischemic heart disease and other diseases of the circulatory system: Secondary | ICD-10-CM

## 2024-05-10 DIAGNOSIS — J9611 Chronic respiratory failure with hypoxia: Secondary | ICD-10-CM | POA: Diagnosis present

## 2024-05-10 DIAGNOSIS — I2782 Chronic pulmonary embolism: Secondary | ICD-10-CM | POA: Diagnosis present

## 2024-05-10 DIAGNOSIS — Z681 Body mass index (BMI) 19 or less, adult: Secondary | ICD-10-CM

## 2024-05-10 DIAGNOSIS — Z888 Allergy status to other drugs, medicaments and biological substances status: Secondary | ICD-10-CM

## 2024-05-10 DIAGNOSIS — Z7901 Long term (current) use of anticoagulants: Secondary | ICD-10-CM

## 2024-05-10 DIAGNOSIS — Z832 Family history of diseases of the blood and blood-forming organs and certain disorders involving the immune mechanism: Secondary | ICD-10-CM

## 2024-05-10 DIAGNOSIS — F32A Depression, unspecified: Secondary | ICD-10-CM | POA: Diagnosis present

## 2024-05-10 DIAGNOSIS — F1190 Opioid use, unspecified, uncomplicated: Secondary | ICD-10-CM

## 2024-05-10 DIAGNOSIS — Z5989 Other problems related to housing and economic circumstances: Secondary | ICD-10-CM

## 2024-05-10 DIAGNOSIS — Z79899 Other long term (current) drug therapy: Secondary | ICD-10-CM

## 2024-05-10 DIAGNOSIS — K219 Gastro-esophageal reflux disease without esophagitis: Secondary | ICD-10-CM | POA: Diagnosis present

## 2024-05-10 DIAGNOSIS — Z21 Asymptomatic human immunodeficiency virus [HIV] infection status: Secondary | ICD-10-CM | POA: Diagnosis present

## 2024-05-10 DIAGNOSIS — G894 Chronic pain syndrome: Secondary | ICD-10-CM | POA: Diagnosis present

## 2024-05-10 DIAGNOSIS — D5700 Hb-SS disease with crisis, unspecified: Principal | ICD-10-CM | POA: Diagnosis present

## 2024-05-10 DIAGNOSIS — I5033 Acute on chronic diastolic (congestive) heart failure: Secondary | ICD-10-CM | POA: Diagnosis present

## 2024-05-10 DIAGNOSIS — E440 Moderate protein-calorie malnutrition: Secondary | ICD-10-CM | POA: Diagnosis present

## 2024-05-10 LAB — CBC WITH DIFFERENTIAL/PLATELET

## 2024-05-10 MED ORDER — HYDROMORPHONE HCL 1 MG/ML IJ SOLN
2.0000 mg | INTRAMUSCULAR | Status: AC
  Administered 2024-05-11: 2 mg via INTRAVENOUS
  Filled 2024-05-10: qty 2

## 2024-05-10 MED ORDER — ONDANSETRON HCL 4 MG/2ML IJ SOLN
4.0000 mg | INTRAMUSCULAR | Status: DC | PRN
  Filled 2024-05-10: qty 2

## 2024-05-10 MED ORDER — DIPHENHYDRAMINE HCL 25 MG PO CAPS
25.0000 mg | ORAL_CAPSULE | ORAL | Status: DC | PRN
  Filled 2024-05-10: qty 1

## 2024-05-10 MED ORDER — OXYCODONE-ACETAMINOPHEN 10-325 MG PO TABS: 1.0000 | ORAL_TABLET | ORAL | 0 refills | Status: DC | PRN

## 2024-05-10 MED ORDER — SODIUM CHLORIDE 0.45 % IV SOLN: INTRAVENOUS | Status: DC

## 2024-05-10 NOTE — ED Triage Notes (Signed)
 Pt arrives w/ c/o SCC pain all over worse pain today. O2 in triage 80% RA. Placed on 4L [place].

## 2024-05-10 NOTE — ED Provider Notes (Signed)
 " [place] EMERGENCY DEPARTMENT AT [facility] Provider Note   CSN: [id number] Arrival date & time: 05/10/24  2308     Patient presents with: Sickle Cell Pain Crisis   Martin [name] is a 34 y.o. male with PMHx sickle cell anemia, HIV, GERD, PE on anticoagulation who presents to ED concerned for sickle cell pain crisis. Patient endorsing lower back and hip pain which is identical to prior sickle cell pain episodes. Last dose of home medications was at 9PM which did not provide much relief. Patient was found to have O2 at 80% on RA in triage and placed on 4L [place] - patient endorsing some SOB which resolved with the [place]. Denies fever, cough, chest pain, vomiting.   Patient endorsing compliance on anticoagulation.    Sickle Cell Pain Crisis      Prior to Admission medications  Medication Sig Start Date End Date Taking? Authorizing Provider  abacavir -dolutegravir -lamiVUDine  (TRIUMEQ ) 600-50-300 MG tablet Take 1 tablet by mouth daily. Patient not taking: Reported on 05/08/2024 05/08/24   [name], [name], [name]  albuterol  (VENTOLIN  HFA) 108 (90 Base) MCG/ACT inhaler Inhale 2 puffs into the lungs every 6 (six) hours as needed for wheezing or shortness of breath. Patient not taking: Reported on 05/08/2024 08/13/23   Pearlean Manus, MD  apixaban  (ELIQUIS ) 5 MG TABS tablet Take 5 mg by mouth 2 (two) times daily.    [provider]  cyclobenzaprine  (FLEXERIL ) 5 MG tablet Take 1 tablet (5 mg total) by mouth 2 (two) times daily as needed for muscle spasms. 02/04/24   [name], [name], [name]  Deferasirox  (JADENU ) 360 MG TABS Take 2 tablets (720 mg total) by mouth daily. 05/08/24   [name], [name], [name]  Dextromethorphan -guaiFENesin  (ROBAFEN DM) 20-200 MG/20ML LIQD Take 10 mLs by mouth every 4 (four) hours as needed (chest congestion). 05/01/24   Jegede, Olugbemiga E, MD  diphenhydrAMINE  (BENADRYL ) 25 MG tablet Take 1 tablet (25 mg total) by mouth every 4 (four) hours as  needed for itching. 08/13/23   Pearlean Manus, MD  folic acid  (FOLVITE ) 1 MG tablet Take 1 tablet (1 mg total) by mouth daily. 05/08/24   [name], [name], [name]  gabapentin  (NEURONTIN ) 300 MG capsule Take 1 capsule (300 mg total) by mouth at bedtime as needed (nerve pain). 08/13/23   Pearlean Manus, MD  guaiFENesin -dextromethorphan  (ROBITUSSIN DM) 100-10 MG/5ML syrup Take 10 mLs by mouth every 4 (four) hours as needed for cough. 03/12/24   Jegede, Olugbemiga E, MD  hydroxyurea  (HYDREA ) 500 MG capsule Take 4 capsules (2,000 mg total) by mouth at bedtime. 08/13/23   Pearlean Manus, MD  NARCAN  4 MG/0.1ML LIQD nasal spray kit Place 1 spray into the nose as needed (respiratory distress / opioid overdose). 01/07/23   [provider]  oxyCODONE -acetaminophen  (PERCOCET) 10-325 MG tablet Take 1 tablet by mouth every 4 (four) hours as needed for pain. 05/10/24   [name], [name], [name]  pantoprazole  (PROTONIX ) 40 MG tablet Take 1 tablet (40 mg total) by mouth daily as needed (acid reflux). 08/13/23   Pearlean Manus, MD  senna-docusate (SENOKOT-S) 8.6-50 MG tablet Take 2 tablets by mouth at bedtime. Patient not taking: Reported on 05/08/2024 08/13/23   Pearlean Manus, MD  Vitamin D , Ergocalciferol , (DRISDOL ) 1.25 MG (50000 UNIT) CAPS capsule Take 1 capsule (50,000 Units total) by mouth every 7 (seven) days. 05/08/24   [name], [name], [name]    Allergies: Ketorolac  tromethamine , Tape, Ketorolac , Wound dressing adhesive, and Bactoshield chg [chlorhexidine  gluconate]  Review of Systems  Constitutional:        Sickle cell pain    Updated Vital Signs BP 106/77 (BP Location: Right Arm)   Pulse 91   Temp 98.8 F (37.1 C) (Oral)   Resp 18   SpO2 94%   Physical Exam Vitals and nursing note reviewed.  Constitutional:      General: He is not in acute distress.    Appearance: He is not ill-appearing or toxic-appearing.  HENT:     Head: Normocephalic and atraumatic.     Mouth/Throat:      Mouth: Mucous membranes are moist.  Eyes:     General: No scleral icterus.       Right eye: No discharge.        Left eye: No discharge.     Conjunctiva/sclera: Conjunctivae normal.  Cardiovascular:     Rate and Rhythm: Normal rate and regular rhythm.     Pulses: Normal pulses.     Heart sounds: Normal heart sounds. No murmur heard. Pulmonary:     Effort: Pulmonary effort is normal. No respiratory distress.     Breath sounds: Normal breath sounds. No wheezing, rhonchi or rales.  Abdominal:     General: Abdomen is flat. Bowel sounds are normal. There is no distension.     Palpations: Abdomen is soft. There is no mass.     Tenderness: There is no abdominal tenderness.  Musculoskeletal:     Right lower leg: No edema.     Left lower leg: No edema.     Comments: +2 pedal pulses BL. Area non-tense. No swelling appreciated.   Skin:    General: Skin is warm and dry.     Findings: No rash.  Neurological:     General: No focal deficit present.     Mental Status: He is alert and oriented to person, place, and time. Mental status is at baseline.  Psychiatric:        Mood and Affect: Mood normal.        Behavior: Behavior normal.     (all labs ordered are listed, but only abnormal results are displayed) Labs Reviewed  COMPREHENSIVE METABOLIC PANEL WITH GFR - Abnormal; Notable for the following components:      Result Value   CO2 21 (*)    Glucose, Bld 124 (*)    Calcium  8.0 (*)    AST 61 (*)    Total Bilirubin 6.7 (*)    All other components within normal limits  CBC WITH DIFFERENTIAL/PLATELET - Abnormal; Notable for the following components:   WBC 12.1 (*)    RBC 2.78 (*)    Hemoglobin 9.6 (*)    HCT 26.9 (*)    MCH 34.5 (*)    RDW 31.0 (*)    nRBC 8.8 (*)    Lymphs Abs 5.9 (*)    Monocytes Absolute 1.5 (*)    All other components within normal limits  RETICULOCYTES - Abnormal; Notable for the following components:   Retic Ct Pct 12.6 (*)    RBC. 2.80 (*)    Retic Count,  Absolute 353.5 (*)    Immature Retic Fract 30.2 (*)    All other components within normal limits  PRO BRAIN NATRIURETIC PEPTIDE - Abnormal; Notable for the following components:   Pro Brain Natriuretic Peptide 957.0 (*)    All other components within normal limits  TROPONIN T, HIGH SENSITIVITY    EKG: EKG Interpretation Date/Time:  Thursday May 11 2024 00:12:29 EST  Ventricular Rate:  89 PR Interval:  172 QRS Duration:  89 QT Interval:  383 QTC Calculation: 466 R Axis:   107  Text Interpretation: Sinus rhythm LAE, consider biatrial enlargement Probable inferior infarct, old No significant change since last tracing Confirmed by Midge Golas ([id number]) on 05/11/2024 12:28:08 AM  Radiology: DG Chest 2 View Result Date: 05/11/2024 EXAM: 2 VIEW(S) XRAY OF THE CHEST 05/11/2024 12:12:19 AM COMPARISON: Portable chest recently 04/22/2024. CLINICAL HISTORY: Sickle cell crisis with shortness of breath. FINDINGS: LINES, TUBES AND DEVICES: There is a right IJ (internal jugular) port catheter again terminating at the superior cavoatrial junction. Overlying telemetry leads. LUNGS AND PLEURA: There is increased vascular engorgement, interval onset of mild interstitial edema with a basal gradient. Trace pleural effusions are beginning to form. Patchy perihilar hazy opacities are present on both sides and could be ground-glass edema or pneumonitis. Findings are superimposed on chronic lung changes. Clinical correlation and radiographic follow-up recommended. No pneumothorax. HEART AND MEDIASTINUM: There is mild cardiomegaly with a prominent left main pulmonary artery segment and stable mediastinum. BONES AND SOFT TISSUES: No acute osseous abnormality. IMPRESSION: 1. Mild interstitial edema with a basal gradient and trace pleural effusions. Findings consistent with chf or fluid overload. Vascular engorgement also increased. 2. Patchy perihilar hazy opacities bilaterally, which may represent ground-glass edema  or pneumonitis, superimposed on chronic lung changes; clinical correlation and radiographic follow-up recommended. Electronically signed by: Francis Quam MD 05/11/2024 12:19 AM EST RP Workstation: [id number]     .Critical Care  Performed by: Hoy Nidia FALCON, PA-C Authorized by: Hoy Nidia FALCON, PA-C   Critical care provider statement:    Critical care time (minutes):  45   Critical care was necessary to treat or prevent imminent or life-threatening deterioration of the following conditions: acute chest syndrome.   Critical care was time spent personally by me on the following activities:  Development of treatment plan with patient or surrogate, discussions with consultants, evaluation of patient's response to treatment, examination of patient, ordering and review of laboratory studies, ordering and review of radiographic studies, ordering and performing treatments and interventions, pulse oximetry, re-evaluation of patient's condition and review of old charts   Care discussed with: admitting provider      Medications Ordered in the ED  diphenhydrAMINE  (BENADRYL ) capsule 25-50 mg (has no administration in time range)  ondansetron  (ZOFRAN ) injection 4 mg (has no administration in time range)  HYDROmorphone  (DILAUDID ) injection 2 mg (has no administration in time range)  0.45 % sodium chloride  infusion ( Intravenous New Bag/Given 05/11/24 0018)  cefTRIAXone  (ROCEPHIN ) 1 g in sodium chloride  0.9 % 100 mL IVPB (1 g Intravenous New Bag/Given 05/11/24 0059)  HYDROmorphone  (DILAUDID ) injection 2 mg (2 mg Intravenous Given 05/11/24 0014)  HYDROmorphone  (DILAUDID ) injection 2 mg (2 mg Intravenous Given 05/11/24 0058)  azithromycin  (ZITHROMAX ) tablet 500 mg (500 mg Oral Given 05/11/24 0058)                                    Medical Decision Making Amount and/or Complexity of Data Reviewed Labs: ordered. Radiology: ordered.  Risk Prescription drug management. Decision regarding  hospitalization.   This patient presents to the ED for concern of sickle cell pain, this involves an extensive number of treatment options, and is a complaint that carries with it a high risk of complications and morbidity.  The differential diagnosis includes sickle cell pain crisis, Anemia, aplastic crisis, acute  chest syndrome/fat embolism, AKI, VTE.   Co morbidities that complicate the patient evaluation   sickle cell anemia, HIV, GERD, PE on anticoagulation    Additional history obtained:  Additional history obtained from recent ED notes reviewed   Problem List / ED Course / Critical interventions / Medication management  Patient presented for sickle cell pain. Sickle cell labs and imaging ordered along with fluids and pain management.  Initial physical exam concerning for hypoxia 80% on RA which patient was placed on 4L [place].  Rest of physical exam reassuring.   I Ordered, and personally interpreted labs.  CBC with leukocytosis of 12.1.  There is also stable anemia with hemoglobin at 9.6 today.  CMP with chronically elevated T. bili at 6.7 today.  BNP elevated at 957.  Troponin within normal limits.  Reticulocytes elevated. I ordered imaging studies including chest x-ray. I independently visualized and interpreted imaging which showed trace pleural effusions and bilateral hazy opacities.I agree with the radiologist interpretation. The patient was maintained on a cardiac monitor.  I personally viewed and interpreted the EKG/cardiac monitored which showed an underlying rhythm of: Sinus rhythm. Patient's sickle cell pain crisis along with hypoxia and findings on x-ray is concerning for acute chest syndrome.  Started IV ABX and recommended admission for further treatment which patient agreed with.  I requested consultation with the hospitalist on-call Dr. Shona,  and discussed lab and imaging findings as well as pertinent plan - they agreed to admit patient. I have reviewed the patients home  medicines and have made adjustments as needed   Social Determinants of Health:  Sickle cell anemia      Final diagnoses:  Sickle cell pain crisis [facility])  Acute chest syndrome [facility])    ED Discharge Orders     None          Hoy Nidia JULIANNA DEVONNA 05/11/24 0125    Midge Golas, MD 05/11/24 0200  "

## 2024-05-10 NOTE — Telephone Encounter (Signed)
 Copied from CRM #[id number]. Topic: Clinical - Medication Refill >> May 10, 2024 12:58 PM Victoria B wrote: Medication: oxyCODONE -acetaminophen  (PERCOCET) 10-325 MG tablet  Has the patient contacted their pharmacy? no  This is the patient's preferred pharmacy:   [facility] [address] [place] [address] Phone: [phone] Fax: [phone]  Is this the correct pharmacy for this prescription? yes   Has the prescription been filled recently? no  Is the patient out of the medication? yes  Has the patient been seen for an appointment in the last year OR does the patient have an upcoming appointment? yes  Can we respond through MyChart? no  Agent: Please be advised that Rx refills may take up to 3 business days. We ask that you follow-up with your pharmacy.

## 2024-05-10 NOTE — ED Provider Notes (Incomplete)
 " [place] EMERGENCY DEPARTMENT AT [facility] Provider Note   CSN: [id number] Arrival date & time: 05/10/24  2308     Patient presents with: Sickle Cell Pain Crisis   Martin [name] is a 34 y.o. male with PMHx sickle cell anemia, HIV, GERD, PE on anticoagulation.  {Add pertinent medical, surgical, social history, OB history to YEP:67052}  Sickle Cell Pain Crisis      Prior to Admission medications  Medication Sig Start Date End Date Taking? Authorizing Provider  abacavir -dolutegravir -lamiVUDine  (TRIUMEQ ) 600-50-300 MG tablet Take 1 tablet by mouth daily. Patient not taking: Reported on 05/08/2024 05/08/24   [name], [name], [name]  albuterol  (VENTOLIN  HFA) 108 (90 Base) MCG/ACT inhaler Inhale 2 puffs into the lungs every 6 (six) hours as needed for wheezing or shortness of breath. Patient not taking: Reported on 05/08/2024 08/13/23   Pearlean Manus, MD  apixaban  (ELIQUIS ) 5 MG TABS tablet Take 5 mg by mouth 2 (two) times daily.    [provider]  cyclobenzaprine  (FLEXERIL ) 5 MG tablet Take 1 tablet (5 mg total) by mouth 2 (two) times daily as needed for muscle spasms. 02/04/24   [name], [name], [name]  Deferasirox  (JADENU ) 360 MG TABS Take 2 tablets (720 mg total) by mouth daily. 05/08/24   [name], [name], [name]  Dextromethorphan -guaiFENesin  (ROBAFEN DM) 20-200 MG/20ML LIQD Take 10 mLs by mouth every 4 (four) hours as needed (chest congestion). 05/01/24   Jegede, Olugbemiga E, MD  diphenhydrAMINE  (BENADRYL ) 25 MG tablet Take 1 tablet (25 mg total) by mouth every 4 (four) hours as needed for itching. 08/13/23   Pearlean Manus, MD  folic acid  (FOLVITE ) 1 MG tablet Take 1 tablet (1 mg total) by mouth daily. 05/08/24   [name], [name], [name]  gabapentin  (NEURONTIN ) 300 MG capsule Take 1 capsule (300 mg total) by mouth at bedtime as needed (nerve pain). 08/13/23   Pearlean Manus, MD  guaiFENesin -dextromethorphan  (ROBITUSSIN DM) 100-10 MG/5ML syrup Take  10 mLs by mouth every 4 (four) hours as needed for cough. 03/12/24   Jegede, Olugbemiga E, MD  hydroxyurea  (HYDREA ) 500 MG capsule Take 4 capsules (2,000 mg total) by mouth at bedtime. 08/13/23   Pearlean Manus, MD  NARCAN  4 MG/0.1ML LIQD nasal spray kit Place 1 spray into the nose as needed (respiratory distress / opioid overdose). 01/07/23   [provider]  oxyCODONE -acetaminophen  (PERCOCET) 10-325 MG tablet Take 1 tablet by mouth every 4 (four) hours as needed for pain. 05/10/24   [name], [name], [name]  pantoprazole  (PROTONIX ) 40 MG tablet Take 1 tablet (40 mg total) by mouth daily as needed (acid reflux). 08/13/23   Pearlean Manus, MD  senna-docusate (SENOKOT-S) 8.6-50 MG tablet Take 2 tablets by mouth at bedtime. Patient not taking: Reported on 05/08/2024 08/13/23   Pearlean Manus, MD  Vitamin D , Ergocalciferol , (DRISDOL ) 1.25 MG (50000 UNIT) CAPS capsule Take 1 capsule (50,000 Units total) by mouth every 7 (seven) days. 05/08/24   [name], [name], [name]    Allergies: Ketorolac  tromethamine , Tape, Ketorolac , Wound dressing adhesive, and Bactoshield chg [chlorhexidine  gluconate]    Review of Systems  Updated Vital Signs BP 106/77 (BP Location: Right Arm)   Pulse 91   Temp 98.8 F (37.1 C) (Oral)   Resp 18   SpO2 94%   Physical Exam  (all labs ordered are listed, but only abnormal results are displayed) Labs Reviewed  COMPREHENSIVE METABOLIC PANEL WITH GFR  CBC WITH DIFFERENTIAL/PLATELET  RETICULOCYTES    EKG: None  Radiology: No results found.  {Document cardiac monitor, telemetry assessment procedure when appropriate:32947} Procedures   Medications Ordered in the ED - No data to display    {Click here for ABCD2, HEART and other calculators REFRESH Note before signing:1}                              Medical Decision Making Amount and/or Complexity of Data Reviewed Labs: ordered.   ***  {Document critical care time when appropriate  Document  review of labs and clinical decision tools ie CHADS2VASC2, etc  Document your independent review of radiology images and any outside records  Document your discussion with family members, caretakers and with consultants  Document social determinants of health affecting pt's care  Document your decision making why or why not admission, treatments were needed:32947:::1}   Final diagnoses:  None    ED Discharge Orders     None        "

## 2024-05-11 ENCOUNTER — Inpatient Hospital Stay (HOSPITAL_COMMUNITY)

## 2024-05-11 ENCOUNTER — Emergency Department (HOSPITAL_COMMUNITY)

## 2024-05-11 DIAGNOSIS — D5701 Hb-SS disease with acute chest syndrome: Secondary | ICD-10-CM

## 2024-05-11 DIAGNOSIS — Z21 Asymptomatic human immunodeficiency virus [HIV] infection status: Secondary | ICD-10-CM | POA: Diagnosis present

## 2024-05-11 DIAGNOSIS — I2724 Chronic thromboembolic pulmonary hypertension: Secondary | ICD-10-CM

## 2024-05-11 DIAGNOSIS — Z91048 Other nonmedicinal substance allergy status: Secondary | ICD-10-CM | POA: Diagnosis not present

## 2024-05-11 DIAGNOSIS — I50813 Acute on chronic right heart failure: Secondary | ICD-10-CM | POA: Diagnosis not present

## 2024-05-11 DIAGNOSIS — I5033 Acute on chronic diastolic (congestive) heart failure: Secondary | ICD-10-CM | POA: Diagnosis present

## 2024-05-11 DIAGNOSIS — K219 Gastro-esophageal reflux disease without esophagitis: Secondary | ICD-10-CM | POA: Diagnosis present

## 2024-05-11 DIAGNOSIS — J9611 Chronic respiratory failure with hypoxia: Secondary | ICD-10-CM | POA: Diagnosis present

## 2024-05-11 DIAGNOSIS — Z888 Allergy status to other drugs, medicaments and biological substances status: Secondary | ICD-10-CM | POA: Diagnosis not present

## 2024-05-11 DIAGNOSIS — I2782 Chronic pulmonary embolism: Secondary | ICD-10-CM | POA: Diagnosis present

## 2024-05-11 DIAGNOSIS — Z5982 Transportation insecurity: Secondary | ICD-10-CM | POA: Diagnosis not present

## 2024-05-11 DIAGNOSIS — D5700 Hb-SS disease with crisis, unspecified: Secondary | ICD-10-CM

## 2024-05-11 DIAGNOSIS — G894 Chronic pain syndrome: Secondary | ICD-10-CM | POA: Diagnosis present

## 2024-05-11 DIAGNOSIS — Z832 Family history of diseases of the blood and blood-forming organs and certain disorders involving the immune mechanism: Secondary | ICD-10-CM | POA: Diagnosis not present

## 2024-05-11 DIAGNOSIS — Z79899 Other long term (current) drug therapy: Secondary | ICD-10-CM | POA: Diagnosis not present

## 2024-05-11 DIAGNOSIS — Z8249 Family history of ischemic heart disease and other diseases of the circulatory system: Secondary | ICD-10-CM | POA: Diagnosis not present

## 2024-05-11 DIAGNOSIS — F32A Depression, unspecified: Secondary | ICD-10-CM | POA: Diagnosis present

## 2024-05-11 DIAGNOSIS — E440 Moderate protein-calorie malnutrition: Secondary | ICD-10-CM | POA: Diagnosis present

## 2024-05-11 DIAGNOSIS — Z681 Body mass index (BMI) 19 or less, adult: Secondary | ICD-10-CM | POA: Diagnosis not present

## 2024-05-11 DIAGNOSIS — D638 Anemia in other chronic diseases classified elsewhere: Secondary | ICD-10-CM | POA: Diagnosis present

## 2024-05-11 DIAGNOSIS — Z7901 Long term (current) use of anticoagulants: Secondary | ICD-10-CM | POA: Diagnosis not present

## 2024-05-11 DIAGNOSIS — Z5989 Other problems related to housing and economic circumstances: Secondary | ICD-10-CM | POA: Diagnosis not present

## 2024-05-11 DIAGNOSIS — Z5941 Food insecurity: Secondary | ICD-10-CM | POA: Diagnosis not present

## 2024-05-11 LAB — COMPREHENSIVE METABOLIC PANEL WITH GFR
ALT: 31 U/L (ref 0–44)
ALT: 32 U/L (ref 0–44)
AST: 55 U/L — ABNORMAL HIGH (ref 15–41)
AST: 61 U/L — ABNORMAL HIGH (ref 15–41)
Albumin: 3.5 g/dL (ref 3.5–5.0)
Albumin: 3.6 g/dL (ref 3.5–5.0)
Alkaline Phosphatase: 92 U/L (ref 38–126)
Alkaline Phosphatase: 93 U/L (ref 38–126)
Anion gap: 9 (ref 5–15)
Anion gap: 9 (ref 5–15)
BUN: 14 mg/dL (ref 6–20)
BUN: 17 mg/dL (ref 6–20)
CO2: 21 mmol/L — ABNORMAL LOW (ref 22–32)
CO2: 23 mmol/L (ref 22–32)
Calcium: 8 mg/dL — ABNORMAL LOW (ref 8.9–10.3)
Calcium: 8.5 mg/dL — ABNORMAL LOW (ref 8.9–10.3)
Chloride: 108 mmol/L (ref 98–111)
Chloride: 111 mmol/L (ref 98–111)
Creatinine, Ser: 0.91 mg/dL (ref 0.61–1.24)
Creatinine, Ser: 0.93 mg/dL (ref 0.61–1.24)
GFR, Estimated: >60 mL/min
GFR, Estimated: >60 mL/min
Glucose, Bld: 103 mg/dL — ABNORMAL HIGH (ref 70–99)
Glucose, Bld: 124 mg/dL — ABNORMAL HIGH (ref 70–99)
Potassium: 3.7 mmol/L (ref 3.5–5.1)
Potassium: 4 mmol/L (ref 3.5–5.1)
Sodium: 139 mmol/L (ref 135–145)
Sodium: 141 mmol/L (ref 135–145)
Total Bilirubin: 5.2 mg/dL — ABNORMAL HIGH (ref 0.0–1.2)
Total Bilirubin: 6.7 mg/dL — ABNORMAL HIGH (ref 0.0–1.2)
Total Protein: 7.7 g/dL (ref 6.5–8.1)
Total Protein: 7.8 g/dL (ref 6.5–8.1)

## 2024-05-11 LAB — CBC WITH DIFFERENTIAL/PLATELET
Abs Immature Granulocytes: 0.05 K/uL (ref 0.00–0.07)
Basophils Absolute: 0.1 K/uL (ref 0.0–0.1)
Basophils Relative: 0 %
Basophils Relative: 0.1 % (ref 0.0–0.1)
Eosinophils Absolute: 0.4 K/uL (ref 0.0–0.5)
Eosinophils Absolute: 1 % (ref 0.0–0.5)
Eosinophils Relative: 0.4 % (ref 0.0–0.5)
Eosinophils Relative: 3 %
HCT: 26.9 % — ABNORMAL LOW (ref 39.0–52.0)
HCT: 27.9 % — ABNORMAL LOW (ref 39.0–52.0)
Hemoglobin: 9.3 g/dL — ABNORMAL LOW (ref 13.0–17.0)
Hemoglobin: 9.6 g/dL — ABNORMAL LOW (ref 13.0–17.0)
Immature Granulocytes: 0 %
Lymphocytes Relative: 41 %
Lymphocytes Relative: 49 %
Lymphs Abs: 5.7 K/uL — ABNORMAL HIGH (ref 0.7–4.0)
Lymphs Abs: 5.9 % — AB (ref 0.7–4.0)
MCH: 33.5 pg (ref 26.0–34.0)
MCH: 34.5 pg — ABNORMAL HIGH (ref 26.0–34.0)
MCHC: 33.3 g/dL (ref 30.0–36.0)
MCHC: 35.7 g/dL (ref 30.0–36.0)
MCV: 100.4 fL — ABNORMAL HIGH (ref 80.0–100.0)
MCV: 96.8 fL (ref 80.0–100.0)
Monocytes Absolute: 1.9 K/uL — ABNORMAL HIGH (ref 0.1–1.0)
Monocytes Absolute: 3 % (ref 0.1–1.0)
Monocytes Relative: 1.5 % — AB (ref 0.1–1.0)
Monocytes Relative: 12 % (ref 0.7–4.0)
Monocytes Relative: 14 %
Neutro Abs: 4.3 K/uL (ref 1.7–7.7)
Neutro Abs: 5.7 K/uL (ref 1.7–7.7)
Neutrophils Relative %: 35 %
Neutrophils Relative %: 42 %
Platelets: 248 K/uL (ref 150–400)
Platelets: 273 K/uL (ref 150–400)
RBC Morphology: 0.04 (ref 0.00–0.07)
RBC: 2.78 MIL/uL — ABNORMAL LOW (ref 4.22–5.81)
RBC: 2.78 MIL/uL — ABNORMAL LOW (ref 4.22–5.81)
RDW: 30.5 % — ABNORMAL HIGH (ref 11.5–15.5)
RDW: 31 % — ABNORMAL HIGH (ref 11.5–15.5)
Smear Review: NORMAL
WBC Morphology: 0 %
WBC: 12.1 K/uL — AB (ref 4.0–10.5)
WBC: 13.7 K/uL — ABNORMAL HIGH (ref 4.0–10.5)
nRBC: 7.9 % — ABNORMAL HIGH (ref 0.0–0.2)
nRBC: 8.8 % — AB (ref 0.0–0.2)

## 2024-05-11 LAB — ECHOCARDIOGRAM LIMITED
Area-P 1/2: 5.13 cm2
Height: 77 in
S' Lateral: 3.3 cm
Weight: 2455.04 [oz_av]

## 2024-05-11 LAB — T-HELPER CELLS (CD4) COUNT (NOT AT ARMC)
CD4 % Helper T Cell: 26 % — ABNORMAL LOW (ref 33–65)
CD4 T Cell Abs: 1020 /uL (ref 400–1790)

## 2024-05-11 LAB — RETICULOCYTES
Immature Retic Fract: 30.2 % — ABNORMAL HIGH (ref 2.3–15.9)
RBC.: 2.8 MIL/uL — ABNORMAL LOW (ref 4.22–5.81)
Retic Count, Absolute: 353.5 K/uL — ABNORMAL HIGH (ref 19.0–186.0)
Retic Ct Pct: 12.6 % — ABNORMAL HIGH (ref 0.4–3.1)

## 2024-05-11 LAB — TROPONIN T, HIGH SENSITIVITY: Troponin T High Sensitivity: 7 ng/L (ref 0–19)

## 2024-05-11 LAB — PRO BRAIN NATRIURETIC PEPTIDE: Pro Brain Natriuretic Peptide: 957 pg/mL — ABNORMAL HIGH

## 2024-05-11 LAB — MAGNESIUM: Magnesium: 1.8 mg/dL (ref 1.7–2.4)

## 2024-05-11 LAB — PHOSPHORUS: Phosphorus: 6.8 mg/dL — ABNORMAL HIGH (ref 2.5–4.6)

## 2024-05-11 MED ORDER — AZITHROMYCIN 250 MG PO TABS
500.0000 mg | ORAL_TABLET | Freq: Once | ORAL | Status: AC
  Administered 2024-05-11: 500 mg via ORAL
  Filled 2024-05-11: qty 2

## 2024-05-11 MED ORDER — SODIUM CHLORIDE 0.45 % IV SOLN: INTRAVENOUS | Status: AC

## 2024-05-11 MED ORDER — HYDROMORPHONE HCL 1 MG/ML IJ SOLN
2.0000 mg | INTRAMUSCULAR | Status: AC
  Administered 2024-05-11 (×2): 2 mg via INTRAVENOUS
  Filled 2024-05-11 (×2): qty 2

## 2024-05-11 MED ORDER — SODIUM CHLORIDE 0.9% FLUSH
10.0000 mL | Freq: Two times a day (BID) | INTRAVENOUS | Status: DC
  Administered 2024-05-11: 10 mL

## 2024-05-11 MED ORDER — ABACAVIR-DOLUTEGRAVIR-LAMIVUD 600-50-300 MG PO TABS
1.0000 | ORAL_TABLET | Freq: Every day | ORAL | Status: DC
  Administered 2024-05-11 – 2024-05-12 (×2): 1 via ORAL
  Filled 2024-05-11 (×2): qty 1

## 2024-05-11 MED ORDER — FOLIC ACID 1 MG PO TABS
1.0000 mg | ORAL_TABLET | Freq: Every day | ORAL | Status: DC
  Administered 2024-05-12: 1 mg via ORAL
  Filled 2024-05-11 (×2): qty 1

## 2024-05-11 MED ORDER — FUROSEMIDE 10 MG/ML IJ SOLN
20.0000 mg | Freq: Once | INTRAMUSCULAR | Status: DC
  Filled 2024-05-11: qty 2

## 2024-05-11 MED ORDER — SODIUM CHLORIDE 0.9% FLUSH: 9.0000 mL | INTRAVENOUS | Status: DC | PRN

## 2024-05-11 MED ORDER — APIXABAN 5 MG PO TABS
5.0000 mg | ORAL_TABLET | Freq: Two times a day (BID) | ORAL | Status: DC
  Administered 2024-05-11 – 2024-05-12 (×3): 5 mg via ORAL
  Filled 2024-05-11 (×5): qty 1

## 2024-05-11 MED ORDER — TECHNETIUM TO 99M ALBUMIN AGGREGATED
4.4000 | Freq: Once | INTRAVENOUS | Status: AC
  Administered 2024-05-11: 4.4 via INTRAVENOUS

## 2024-05-11 MED ORDER — SODIUM CHLORIDE 0.9 % IV SOLN
1.0000 g | Freq: Once | INTRAVENOUS | Status: AC
  Administered 2024-05-11: 1 g via INTRAVENOUS
  Filled 2024-05-11: qty 10

## 2024-05-11 MED ORDER — CHLORHEXIDINE GLUCONATE CLOTH 2 % EX PADS: 6.0000 | MEDICATED_PAD | Freq: Every day | CUTANEOUS | Status: DC

## 2024-05-11 MED ORDER — HYDROXYUREA 500 MG PO CAPS
2000.0000 mg | ORAL_CAPSULE | Freq: Every day | ORAL | Status: DC
  Administered 2024-05-11: 2000 mg via ORAL
  Filled 2024-05-11 (×2): qty 4

## 2024-05-11 MED ORDER — NALOXONE HCL 0.4 MG/ML IJ SOLN: 0.4000 mg | INTRAMUSCULAR | Status: DC | PRN

## 2024-05-11 MED ORDER — HYDROMORPHONE 1 MG/ML IV SOLN
INTRAVENOUS | Status: DC
  Administered 2024-05-11: 2.5 mg via INTRAVENOUS
  Administered 2024-05-11: 5 mg via INTRAVENOUS
  Administered 2024-05-11 – 2024-05-12 (×2): 30 mg via INTRAVENOUS
  Administered 2024-05-12: 4.5 mg via INTRAVENOUS
  Administered 2024-05-12: 9 mg via INTRAVENOUS
  Filled 2024-05-11 (×2): qty 30

## 2024-05-11 MED ORDER — SODIUM CHLORIDE 0.9% FLUSH: 10.0000 mL | INTRAVENOUS | Status: DC | PRN

## 2024-05-11 NOTE — Progress Notes (Addendum)
 Martin [name] is a 34 y.o. male with medical history significant for sickle cell disease, history of acute chest syndrome, multiple sickle cell crisis, chronic pain syndrome, generalized anxiety disorder, GERD, HIV, history of pulmonary embolism on Eliquis , chronic hypoxic respiratory failure on 2 L oxygen  nasal cannula at baseline, who presents to the ER with generalized body pain similar to prior sickle cell pain crisis.  Denies any cough or subjective fevers at home.  No chills.  No chest pain.  Has been feeling progressively short of breath.  Endorses a family history of premature coronary artery disease in his father who had an MI at the age of 37.  Presented to the ER for further evaluation.   Patient has no new concerns today, he rates his pain 8/10. Denies subjective fever, N//V/D. No flu like symptoms, no urinary symptoms. No sick contacts or recent travels. Cardiology consult completed by admitting physician

## 2024-05-11 NOTE — Consult Note (Addendum)
 "  Cardiology Consultation   Patient ID: Martin [name] MRN: [id number]; DOB: 12-18-1990  Admit date: 05/10/2024 Date of Consult: 05/11/2024  PCP:  [name], [name], [name]   [facility] Providers Cardiologist:  None        Patient Profile: Martin [name] is a 34 y.o. 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  History of Present Illness: Martin [name] is a 34 year old male with prior cardiac history mentioned below.  The patient has had a history of recurrent PEs dating back to 2020.  The patient has a history of questionable compliance with his anticoagulation.  The patient has previously had recurrent PEs on Xarelto  and Eliquis .  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  The patient has not previously had a right heart cath.  Was hospitalized on 01/2024 for sickle cell crisis and acute on chronic PE.  TTE during this hospitalization showed a normal LVEF of 65 to 70%, no RWMA, mild concentric LVH, mildly reduced RV systolic function, severely elevated RV systolic pressure of 94.9 mmHg, enlarged RV, moderate to severe TR, and grossly normal aortic valve function.  The patient was scheduled for an outpatient appointment with the advanced heart failure clinic but did not show up to this appointment.  Later on 03/2024 was rehospitalized for another sickle cell crisis.  Patient was also anemic requiring a blood transfusion.  Patient presented to the emergency department for concerns of pain from sickle cell crisis.   On interview patient reported that he was having 9 out of 10 pain all over from his sickle cell crisis.  In  addition to this reported having worsening shortness of breath, dyspnea on exertion, and orthopnea for the past 2 weeks.  Denies having any chest pain or chest discomfort.  Denies alcohol  use and nicotine  use.  Reported that he does smoke marijuana.  Does a 1 mile walk at home and takes 45 minutes to do this.  Denies missing any doses of his Eliquis .  Reported that his father had an MI at age 63.  Denies any other family history.  Labs showed elevated BNP of 957, high-sensitivity troponin 7, potassium 4.0, magnesium  of 1.8, creatinine of 0.93, sodium of 139, glucose of 103, leukocytosis with a WBC count of 12.1, and anemia with a hemoglobin of 9.6.  EKG showed normal sinus rhythm with a rate of 89, and T wave inversions in V1 through V5 seen on prior EKGs.  Chest x-ray showed mild interstitial edema, trace pleural effusions, and bilateral perihilar opacities.  Past Medical History:  Diagnosis Date   Anxiety    Chest pain 02/04/2024   HIV (human immunodeficiency virus infection) (HCC)    Proteinuria    Sickle cell disease (HCC)    Vitamin D  deficiency 10/2018    Past Surgical History:  Procedure Laterality Date   IR IMAGING GUIDED PORT INSERTION  08/29/2019   IR REMOVAL TUN ACCESS W/ PORT W/O FL MOD SED  08/30/2020   TEE WITHOUT CARDIOVERSION N/A 08/30/2020   Procedure: TRANSESOPHAGEAL ECHOCARDIOGRAM (TEE);  Surgeon: Mona Vinie BROCKS, MD;  Location: [facility] ENDOSCOPY;  Service: Cardiovascular;  Laterality: N/A;     Home Medications:  Prior  to Admission medications  Medication Sig Start Date End Date Taking? Authorizing Provider  abacavir -dolutegravir -lamiVUDine  (TRIUMEQ ) 600-50-300 MG tablet Take 1 tablet by mouth daily. 05/08/24   [name], [name], [name]  albuterol  (VENTOLIN  HFA) 108 (90 Base) MCG/ACT inhaler Inhale 2 puffs into the lungs every 6 (six) hours as needed for wheezing or shortness of breath. Patient not taking: Reported on 05/08/2024 08/13/23   Pearlean Manus, MD  apixaban   (ELIQUIS ) 5 MG TABS tablet Take 5 mg by mouth 2 (two) times daily.    [provider]  cyclobenzaprine  (FLEXERIL ) 5 MG tablet Take 1 tablet (5 mg total) by mouth 2 (two) times daily as needed for muscle spasms. 02/04/24   [name], [name], [name]  Deferasirox  (JADENU ) 360 MG TABS Take 2 tablets (720 mg total) by mouth daily. 05/08/24   [name], [name], [name]  Dextromethorphan -guaiFENesin  (ROBAFEN DM) 20-200 MG/20ML LIQD Take 10 mLs by mouth every 4 (four) hours as needed (chest congestion). 05/01/24   Jegede, Olugbemiga E, MD  diphenhydrAMINE  (BENADRYL ) 25 MG tablet Take 1 tablet (25 mg total) by mouth every 4 (four) hours as needed for itching. 08/13/23   Pearlean Manus, MD  folic acid  (FOLVITE ) 1 MG tablet Take 1 tablet (1 mg total) by mouth daily. 05/08/24   [name], [name], [name]  gabapentin  (NEURONTIN ) 300 MG capsule Take 1 capsule (300 mg total) by mouth at bedtime as needed (nerve pain). 08/13/23   Pearlean Manus, MD  guaiFENesin -dextromethorphan  (ROBITUSSIN DM) 100-10 MG/5ML syrup Take 10 mLs by mouth every 4 (four) hours as needed for cough. 03/12/24   Jegede, Olugbemiga E, MD  hydroxyurea  (HYDREA ) 500 MG capsule Take 4 capsules (2,000 mg total) by mouth at bedtime. 08/13/23   Pearlean Manus, MD  NARCAN  4 MG/0.1ML LIQD nasal spray kit Place 1 spray into the nose as needed (respiratory distress / opioid overdose). 01/07/23   [provider]  oxyCODONE -acetaminophen  (PERCOCET) 10-325 MG tablet Take 1 tablet by mouth every 4 (four) hours as needed for pain. 05/10/24   [name], [name], [name]  pantoprazole  (PROTONIX ) 40 MG tablet Take 1 tablet (40 mg total) by mouth daily as needed (acid reflux). 08/13/23   Pearlean Manus, MD  senna-docusate (SENOKOT-S) 8.6-50 MG tablet Take 2 tablets by mouth at bedtime. Patient not taking: Reported on 05/08/2024 08/13/23   Pearlean Manus, MD  Vitamin D , Ergocalciferol , (DRISDOL ) 1.25 MG (50000 UNIT) CAPS capsule Take 1 capsule (50,000 Units  total) by mouth every 7 (seven) days. 05/08/24   [name], [name], [name]    Scheduled Meds:  abacavir -dolutegravir -lamiVUDine   1 tablet Oral Daily   apixaban   5 mg Oral BID   Chlorhexidine  Gluconate Cloth  6 each Topical Daily   folic acid   1 mg Oral Daily   HYDROmorphone    Intravenous Q4H   hydroxyurea   2,000 mg Oral QHS   sodium chloride  flush  10-40 mL Intracatheter Q12H   Continuous Infusions:  sodium chloride  40 mL/hr at 05/11/24 0240   PRN Meds: diphenhydrAMINE , naloxone  **AND** sodium chloride  flush, ondansetron , sodium chloride  flush  Allergies:   Allergies[1]  Social History:   Social History   Socioeconomic History   Marital status: Single    Spouse name: Not on file   Number of children: Not on file   Years of education: Not on file   Highest education level: 12th grade  Occupational History   Occupation: disabled  Tobacco Use   Smoking status: Never   Smokeless tobacco: Never  Vaping Use  Vaping status: Never Used  Substance and Sexual Activity   Alcohol  use: No   Drug use: Not Currently    Types: Marijuana    Comment: 2x week   Sexual activity: Yes    Birth control/protection: Condom  Other Topics Concern   Not on file  Social History Narrative   Lives with his siblings.    Social Drivers of Health   Tobacco Use: Low Risk (05/10/2024)   Patient History    Smoking Tobacco Use: Never    Smokeless Tobacco Use: Never    Passive Exposure: Not on file  Financial Resource Strain: Low Risk (02/21/2024)   Overall Financial Resource Strain (CARDIA)    Difficulty of Paying Living Expenses: Not very hard  Food Insecurity: Food Insecurity Present (05/11/2024)   Epic    Worried About Programme Researcher, Broadcasting/film/video in the Last Year: Sometimes true    Ran Out of Food in the Last Year: Sometimes true  Transportation Needs: Unmet Transportation Needs (05/11/2024)   Epic    Lack of Transportation (Medical): Yes    Lack of Transportation (Non-Medical): Yes  Physical  Activity: Sufficiently Active (10/09/2023)   Received from [facility]   Exercise Vital Sign    On average, how many days per week do you engage in moderate to strenuous exercise (like a brisk walk)?: 7 days    On average, how many minutes do you engage in exercise at this level?: 60 min  Stress: No Stress Concern Present (10/09/2023)   Received from [facility] of Occupational [facility] - Occupational Stress Questionnaire    Feeling of Stress : Not at all  Social Connections: Socially Isolated (03/04/2024)   Social Connection and Isolation Panel    Frequency of Communication with Friends and Family: More than three times a week    Frequency of Social Gatherings with Friends and Family: Twice a week    Attends Religious Services: Never    Database Administrator or Organizations: No    Attends Banker Meetings: Never    Marital Status: Never married  Intimate Partner Violence: Not At Risk (05/11/2024)   Epic    Fear of Current or Ex-Partner: No    Emotionally Abused: No    Physically Abused: No    Sexually Abused: No  Depression (PHQ2-9): Low Risk (05/08/2024)   Depression (PHQ2-9)    PHQ-2 Score: 0  Alcohol  Screen: Not on file  Housing: High Risk (05/11/2024)   Epic    Unable to Pay for Housing in the Last Year: Yes    Number of Times Moved in the Last Year: 1    Homeless in the Last Year: Yes  Utilities: At Risk (05/11/2024)   Epic    Threatened with loss of utilities: Yes  Health Literacy: Not on file    Family History:    Family History  Problem Relation Age of Onset   Sickle cell trait Mother    Sickle cell trait Father    Birth defects Maternal Grandmother    Birth defects Paternal Grandmother      ROS:  Please see the history of present illness.   All other ROS reviewed and negative.     Physical Exam/Data: Vitals:   05/11/24 0318 05/11/24 0428 05/11/24 0607 05/11/24 0637  BP:   115/85   Pulse:   91   Resp:  16 18 16    Temp:   98.5 F (36.9 C)   TempSrc:  SpO2:  91% 94% 93%  Weight: 69.6 kg     Height: 6' 5 (1.956 m)       Intake/Output Summary (Last 24 hours) at 05/11/2024 0725 Last data filed at 05/11/2024 0600 Gross per 24 hour  Intake 613.33 ml  Output 300 ml  Net 313.33 ml      05/11/2024    3:18 AM 05/08/2024    1:09 PM 04/20/2024   11:44 AM  Last 3 Weights  Weight (lbs) 153 lb 7 oz 153 lb 139 lb 15.9 oz  Weight (kg) 69.6 kg 69.4 kg 63.5 kg     Body mass index is 18.2 kg/m.  General: Patient appearing frail and malnourished.  Moving around bed and appearing uncomfortable.  Has an SpO2 in the low 90s on 5 L nasal cannula. HEENT: normal Neck: no JVD Vascular: No carotid bruits; Distal pulses 2+ bilaterally Cardiac:  normal S1, S2; RRR; no murmur Lungs: Bibasilar crackles. Abd: soft, nontender, no hepatomegaly  Ext: no edema Musculoskeletal:  No deformities Skin: warm and dry  Neuro:  no focal abnormalities noted Psych:  Normal affect   EKG:  The EKG was personally reviewed and demonstrates: normal sinus rhythm with a rate of 89, and T wave inversions in V1 through V5 seen on prior EKGs Telemetry:  Telemetry was personally reviewed and demonstrates: Normal sinus rhythm with heart rates in the 80s to 90s.  Relevant CV Studies: Echocardiogram pending  Laboratory Data: High Sensitivity Troponin:  No results for input(s): TROPONINIHS in the last 720 hours.  Recent Labs  Lab 04/20/24 1310 05/10/24 2348  TRNPT <15 7      Chemistry Recent Labs  Lab 05/08/24 1330 05/10/24 2348 05/11/24 0502  NA 145* 141 139  K 5.0 3.7 4.0  CL 110* 111 108  CO2 19* 21* 23  GLUCOSE 81 124* 103*  BUN 9 17 14   CREATININE 0.90 0.91 0.93  CALCIUM  8.8 8.0* 8.5*  MG  --   --  1.8  GFRNONAA  --  >60 >60  ANIONGAP  --  9 9    Recent Labs  Lab 05/08/24 1330 05/10/24 2348 05/11/24 0502  PROT 7.8 7.7 7.8  ALBUMIN  3.6* 3.5 3.6  AST 46* 61* 55*  ALT 25 31 32  ALKPHOS 96 92 93  BILITOT  7.2* 6.7* 5.2*   Lipids No results for input(s): CHOL, [name], HDL, LABVLDL, LDLCALC, CHOLHDL in the last 168 hours.  Hematology Recent Labs  Lab 05/08/24 1330 05/10/24 2348  WBC 9.5 12.1*  RBC 3.09* 2.78*  2.80*  HGB 10.6* 9.6*  HCT 31.3* 26.9*  MCV 101* 96.8  MCH 34.3* 34.5*  MCHC 33.9 35.7  RDW 30.2* 31.0*  PLT 232 273   Thyroid No results for input(s): TSH, FREET4 in the last 168 hours.  BNP Recent Labs  Lab 05/10/24 2348  PROBNP 957.0*    DDimer No results for input(s): DDIMER in the last 168 hours.  Radiology/Studies:  DG Chest 2 View Result Date: 05/11/2024 EXAM: 2 VIEW(S) XRAY OF THE CHEST 05/11/2024 12:12:19 AM COMPARISON: Portable chest recently 04/22/2024. CLINICAL HISTORY: Sickle cell crisis with shortness of breath. FINDINGS: LINES, TUBES AND DEVICES: There is a right IJ (internal jugular) port catheter again terminating at the superior cavoatrial junction. Overlying telemetry leads. LUNGS AND PLEURA: There is increased vascular engorgement, interval onset of mild interstitial edema with a basal gradient. Trace pleural effusions are beginning to form. Patchy perihilar hazy opacities are present on both sides and could be  ground-glass edema or pneumonitis. Findings are superimposed on chronic lung changes. Clinical correlation and radiographic follow-up recommended. No pneumothorax. HEART AND MEDIASTINUM: There is mild cardiomegaly with a prominent left main pulmonary artery segment and stable mediastinum. BONES AND SOFT TISSUES: No acute osseous abnormality. IMPRESSION: 1. Mild interstitial edema with a basal gradient and trace pleural effusions. Findings consistent with chf or fluid overload. Vascular engorgement also increased. 2. Patchy perihilar hazy opacities bilaterally, which may represent ground-glass edema or pneumonitis, superimposed on chronic lung changes; clinical correlation and radiographic follow-up recommended. Electronically signed by:  Francis Quam MD 05/11/2024 12:19 AM EST RP Workstation: [id number]     Assessment and Plan:  Martin [name] is a 34 y.o. male with a hx of anxiety, family history of premature MI, sickle cell disease, history of chronic PE on Eliquis , respiratory failure on 3 to 4 L [place] at home, pulmonary hypertension, and HIV who is being seen 05/11/2024 for the evaluation of heart failure at the request of Concepcion Riser MD.   Sickle cell crisis Has a history of sickle cell disease.  Presented to the hospital for worsening pain all over that feels like a sickle cell crisis.  Reported 9 out of 10 pain during the interview.  Has IV Dilaudid  ordered.  Is receiving IV fluids for sickle cell crisis Management per primary   Severe pulmonary hypertension RV failure Possible acute HFpEF versus pneumonia Reported having some worsening shortness of breath and dyspnea on exertion over the past 2 weeks.  On physical exam appears mildly volume up.  Most recent BP was 115/85 Labs showed elevated BNP of 957, potassium 4.0, magnesium  of 1.8, leukocytosis with a WBC count of 12.1, and creatinine of 0.93. Chest x-ray showed mild interstitial edema, trace pleural effusions, and bilateral perihilar opacities. TTE on 01/2024 showed a normal LVEF of 65 to 70%, no RWMA, mild concentric LVH, mildly reduced RV systolic function, severely elevated RV systolic pressure of 94.9 mmHg, enlarged RV, moderate to severe TR, and grossly normal aortic valve function. Pulmonary hypertension is suspected the WHO class IV from chronic PE.  Has not had a right heart cath done yet.  Patient if may benefit from inpatient right heart cath given has not been able to make outpatient appointments.  Patient may also have pneumonia.  Will be cautious with diuresis given sickle cell crisis and severe pulmonary hypertension. Order 20 mg IV Lasix  Echo pending   Chronic pulmonary embolism on Eliquis  Has a history of questionable medication  compliance. The patient has previously had recurrent PEs on Xarelto  and Eliquis .  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  On interview patient reported compliance with Eliquis . VQ scan pending   Family history of premature MI Marijuana use Reported that his father had an MI at age 91.  Reported that he smokes marijuana.  No coronary calcification seen on prior chest CTs.  Denies any chest pain. Had negative high-sensitivity troponin of 7. EKG showed normal sinus rhythm with a rate of 89, and T wave inversions in V1 through V5 seen on prior EKGs. Not consistent with ACS.   HIV CD4 count and viral count pending.  May contribute to sickle cell crisis and possible pneumonia Management per primary   Otherwise management per primary    Risk Assessment/Risk Scores:     [place]  Heart Association (NYHA) Functional Class NYHA Class II       For questions or updates,  please contact [facility] Please consult [email or web address] for contact info under     Signed, Morse Clause, PA-C  05/11/2024 7:25 AM      [1]  Allergies Allergen Reactions   Ketorolac  Tromethamine  Swelling and Other (See Comments)    Patient reports facial edema and left arm edema after administration.   Tape Other (See Comments), Rash and Dermatitis    PLEASE DO NOT USE THE CLEAR, THICK, PLASTIC TAPE- only paper tape is tolerated  Paper Tape = ok   Ketorolac  Other (See Comments) and Swelling    Patient reports facial edema and left arm edema after administration.   Wound Dressing Adhesive Rash   Bactoshield Chg [Chlorhexidine  Gluconate] Other (See Comments)    Per patient it makes me breakout   "

## 2024-05-11 NOTE — H&P (Addendum)
 " History and Physical  Martin [name] FMW:[id number] DOB: [birth year] DOA: 05/10/2024  Referring physician: Hoy Fraction, PA-EDP  PCP: [name], Martin [name], Martin [name]  Outpatient Specialists: Sickle cell clinic. Patient coming from: Home.  Chief Complaint: Sickle cell pain crisis.  HPI: Martin [name] is a 34 y.o. male with medical history significant for sickle cell disease, history of acute chest syndrome, multiple sickle cell crisis, chronic pain syndrome, generalized anxiety disorder, GERD, HIV, history of pulmonary embolism on Eliquis , chronic hypoxic respiratory failure on 2 L oxygen  nasal cannula at baseline, who presents to the ER with generalized body pain similar to prior sickle cell pain crisis.  Denies any cough or subjective fevers at home.  No chills.  No chest pain.  Has been feeling progressively short of breath.  Endorses a family history of premature coronary artery disease in his father who had an MI at the age of 38.  Presented to the ER for further evaluation.  In the ER, afebrile, O2 saturation 80% on room air in triage.  Was placed on 4 L nasal cannula.  Chest x-ray showing increased pulmonary vascularity and small bilateral pleural effusions consistent with pulmonary edema, likely cardiogenic.  The patient received Rocephin  and p.o. azithromycin  in the ER due to initial concern of pneumonia.  Also received multiple rounds of IV opiate-based analgesics with persistent diffuse body pain.  TRH, hospitalist service, was asked to admit for sickle cell pain crisis.    ED Course: Temperature 98.8. BP 130/67, pulse 86, respiration rate 14, saturation 90% on room 4 L nasal cannula.  Review of Systems: Review of systems as noted in the HPI. All other systems reviewed and are negative.   Past Medical History:  Diagnosis Date   Anxiety    Chest pain 02/04/2024   HIV (human immunodeficiency virus infection) (HCC)    Proteinuria    Sickle cell disease (HCC)     Vitamin D  deficiency 10/2018   Past Surgical History:  Procedure Laterality Date   IR IMAGING GUIDED PORT INSERTION  08/29/2019   IR REMOVAL TUN ACCESS W/ PORT W/O FL MOD SED  08/30/2020   TEE WITHOUT CARDIOVERSION N/A 08/30/2020   Procedure: TRANSESOPHAGEAL ECHOCARDIOGRAM (TEE);  Surgeon: Martin Vinie BROCKS, Martin [name];  Location: [facility] ENDOSCOPY;  Service: Cardiovascular;  Laterality: N/A;    Social History:  reports that he has never smoked. He has never used smokeless tobacco. He reports that he does not currently use drugs after having used the following drugs: Marijuana. He reports that he does not drink alcohol .   Allergies[1]  Family History  Problem Relation Age of Onset   Sickle cell trait Mother    Sickle cell trait Father    Birth defects Maternal Grandmother    Birth defects Paternal Grandmother       Prior to Admission medications  Medication Sig Start Date End Date Taking? Authorizing Provider  abacavir -dolutegravir -lamiVUDine  (TRIUMEQ ) 600-50-300 MG tablet Take 1 tablet by mouth daily. Patient not taking: Reported on 05/08/2024 05/08/24   [name], Martin [name], Martin [name]  albuterol  (VENTOLIN  HFA) 108 (90 Base) MCG/ACT inhaler Inhale 2 puffs into the lungs every 6 (six) hours as needed for wheezing or shortness of breath. Patient not taking: Reported on 05/08/2024 08/13/23   Martin Manus, Martin [name]  apixaban  (ELIQUIS ) 5 MG TABS tablet Take 5 mg by mouth 2 (two) times daily.    Provider, Historical, Martin [name]  cyclobenzaprine  (FLEXERIL ) 5 MG tablet Take 1 tablet (5 mg total) by mouth 2 (two) times daily as  needed for muscle spasms. 02/04/24   [name], Martin [name], Martin [name]  Deferasirox  (JADENU ) 360 MG TABS Take 2 tablets (720 mg total) by mouth daily. 05/08/24   [name], Martin [name], Martin [name]  Dextromethorphan -guaiFENesin  (ROBAFEN DM) 20-200 MG/20ML LIQD Take 10 mLs by mouth every 4 (four) hours as needed (chest congestion). 05/01/24   Jegede, Martin [name], Martin [name]  diphenhydrAMINE  (BENADRYL ) 25 MG tablet Take 1 tablet (25  mg total) by mouth every 4 (four) hours as needed for itching. 08/13/23   Martin Manus, Martin [name]  folic acid  (FOLVITE ) 1 MG tablet Take 1 tablet (1 mg total) by mouth daily. 05/08/24   [name], Martin [name], Martin [name]  gabapentin  (NEURONTIN ) 300 MG capsule Take 1 capsule (300 mg total) by mouth at bedtime as needed (nerve pain). 08/13/23   Martin Manus, Martin [name]  guaiFENesin -dextromethorphan  (ROBITUSSIN DM) 100-10 MG/5ML syrup Take 10 mLs by mouth every 4 (four) hours as needed for cough. 03/12/24   Jegede, Martin [name], Martin [name]  hydroxyurea  (HYDREA ) 500 MG capsule Take 4 capsules (2,000 mg total) by mouth at bedtime. 08/13/23   Martin Manus, Martin [name]  NARCAN  4 MG/0.1ML LIQD nasal spray kit Place 1 spray into the nose as needed (respiratory distress / opioid overdose). 01/07/23   Provider, Historical, Martin [name]  oxyCODONE -acetaminophen  (PERCOCET) 10-325 MG tablet Take 1 tablet by mouth every 4 (four) hours as needed for pain. 05/10/24   [name], Martin [name], Martin [name]  pantoprazole  (PROTONIX ) 40 MG tablet Take 1 tablet (40 mg total) by mouth daily as needed (acid reflux). 08/13/23   Martin Manus, Martin [name]  senna-docusate (SENOKOT-S) 8.6-50 MG tablet Take 2 tablets by mouth at bedtime. Patient not taking: Reported on 05/08/2024 08/13/23   Martin Manus, Martin [name]  Vitamin D , Ergocalciferol , (DRISDOL ) 1.25 MG (50000 UNIT) CAPS capsule Take 1 capsule (50,000 Units total) by mouth every 7 (seven) days. 05/08/24   [name], Martin [name], Martin [name]    Physical Exam: BP 106/77 (BP Location: Right Arm)   Pulse 91   Temp 98.8 F (37.1 C) (Oral)   Resp 18   SpO2 94%   General: 34 y.o. year-old male well developed well nourished in no acute distress.  Alert and oriented x3. Cardiovascular: Regular rate and rhythm with no rubs or gallops.  No thyromegaly or JVD noted.  No lower extremity edema. 2/4 pulses in all 4 extremities. Respiratory: Diffuse faint rales bilaterally.  Poor inspiratory effort. Abdomen: Soft nontender nondistended with normal bowel sounds x4  quadrants. Muskuloskeletal: No cyanosis, clubbing or edema noted bilaterally Neuro: CN II-XII intact, strength, sensation, reflexes Skin: No ulcerative lesions noted or rashes Psychiatry: Judgement and insight appear normal. Mood is appropriate for condition and setting          Labs on Admission:  Basic Metabolic Panel: Recent Labs  Lab 05/08/24 1330 05/10/24 2348  NA 145* 141  K 5.0 3.7  CL 110* 111  CO2 19* 21*  GLUCOSE 81 124*  BUN 9 17  CREATININE 0.90 0.91  CALCIUM  8.8 8.0*   Liver Function Tests: Recent Labs  Lab 05/08/24 1330 05/10/24 2348  AST 46* 61*  ALT 25 31  ALKPHOS 96 92  BILITOT 7.2* 6.7*  PROT 7.8 7.7  ALBUMIN  3.6* 3.5   No results for input(s): LIPASE, AMYLASE in the last 168 hours. No results for input(s): AMMONIA in the last 168 hours. CBC: Recent Labs  Lab 05/08/24 1330 05/10/24 2348  WBC 9.5 12.1*  NEUTROABS 3.5 4.3  HGB 10.6* 9.6*  HCT 31.3* 26.9*  MCV 101* 96.8  PLT  232 273   Cardiac Enzymes: No results for input(s): CKTOTAL, CKMB, CKMBINDEX, TROPONINI in the last 168 hours.  BNP (last 3 results) No results for input(s): BNP in the last 8760 hours.  ProBNP (last 3 results) Recent Labs    01/16/24 1816 05/10/24 2348  PROBNP 2,143.0* 957.0*    CBG: No results for input(s): GLUCAP in the last 168 hours.  Radiological Exams on Admission: DG Chest 2 View Result Date: 05/11/2024 EXAM: 2 VIEW(S) XRAY OF THE CHEST 05/11/2024 12:12:19 AM COMPARISON: Portable chest recently 04/22/2024. CLINICAL HISTORY: Sickle cell crisis with shortness of breath. FINDINGS: LINES, TUBES AND DEVICES: There is a right IJ (internal jugular) port catheter again terminating at the superior cavoatrial junction. Overlying telemetry leads. LUNGS AND PLEURA: There is increased vascular engorgement, interval onset of mild interstitial edema with a basal gradient. Trace pleural effusions are beginning to form. Patchy perihilar hazy opacities are  present on both sides and could be ground-glass edema or pneumonitis. Findings are superimposed on chronic lung changes. Clinical correlation and radiographic follow-up recommended. No pneumothorax. HEART AND MEDIASTINUM: There is mild cardiomegaly with a prominent left main pulmonary artery segment and stable mediastinum. BONES AND SOFT TISSUES: No acute osseous abnormality. IMPRESSION: 1. Mild interstitial edema with a basal gradient and trace pleural effusions. Findings consistent with chf or fluid overload. Vascular engorgement also increased. 2. Patchy perihilar hazy opacities bilaterally, which may represent ground-glass edema or pneumonitis, superimposed on chronic lung changes; clinical correlation and radiographic follow-up recommended. Electronically signed by: Francis Quam Martin [name] 05/11/2024 12:19 AM EST RP Workstation: [id number]    EKG: I independently viewed the EKG done and my findings are as followed: Sinus rhythm rate of 89.  QTc 466.  Assessment/Plan Present on Admission:  Sickle cell pain crisis (HCC)  Active Problems:   Sickle cell pain crisis (HCC)  Sickle cell pain crisis Continue pain control as needed PCA pump Dilaudid  IV antiemetics as needed Gentle IV fluid hydration half-normal saline at 40 cc/h x 12 days Closely monitor volume status while on IV fluid due to severe pulmonary hypertension seen on echocardiogram done on 02/09/2024, pulmonary edema, and bilateral pleural effusions. Resume home hydroxyurea  and folic acid  supplement  Acute on chronic HFpEF Presented with elevated proBNP, pulmonary edema seen on chest x-ray, worsening dyspnea Last 2D echo done on 02/09/2024 revealed LVEF 65 to 70%, interventricular septum flattening in systole and diastole consistent with right ventricular pressure and volume overload.  Right ventricular size is severely enlarged.  Severely elevated pulmonary artery systolic pressure.  Estimated right ventricular systolic pressure is 94.9.   Right atrial size was severely dilated.  Moderate mitral valve regurgitation.  No evidence of mitral stenosis.  Tricuspid valve regurgitation is moderate to severe.  Aortic valve is tricuspid.   Closely monitor volume status while on gentle IV fluid half-normal saline at 40 cc/h x 12 hours for sickle cell crisis. Follow repeat 2D echo limited Monitor strict I's and O's and daily weight Cardiology consulted  Severe pulmonary hypertension Severely dilated RV with RVSP 95 mmHg, 2 D echo 02/09/24. Unclear if HIV-associated. Follow-up CD4 and viral load Cardiology consulted to assist with the management.  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 Continue home Eliquis   HIV Resume home ART Follow CD4 and viral load  Anemia of chronic disease in the setting of sickle cell Hemoglobin stable at 9.6 No reported overt bleeding. Continue to monitor H&H.  Isolated elevated AST Likely multifactorial AST  61, ALT 31 Trend LFTs.  Leukocytosis Suspect reactive in the setting of sickle cell pain crisis Afebrile and nonseptic appearing. Continue to monitor off antibiotics.  Moderate protein calorie malnutrition Moderate muscle mass loss BMI 19 Increase protein calorie intake as tolerated    Critical care time: 55 minutes.    DVT prophylaxis: Home Eliquis  twice daily.  Code Status: Full code.  Family Communication: None at bedside.  Disposition Plan: Admitted to telemetry unit.  Consults called: Cardiology consulted.  Admission status: Inpatient status.   Status is: Inpatient The patient requires at least 2 midnights for further evaluation and treatment of present condition.   Terry LOISE Hurst Martin [name] [facility] Pager [phone]  If 7PM-7AM, please contact night-coverage [email or web address] Password [facility]  05/11/2024, 1:59 AM      [1]  Allergies Allergen Reactions   Ketorolac   Tromethamine  Swelling and Other (See Comments)    Patient reports facial edema and left arm edema after administration.   Tape Other (See Comments), Rash and Dermatitis    PLEASE DO NOT USE THE CLEAR, THICK, PLASTIC TAPE- only paper tape is tolerated  Paper Tape = ok   Ketorolac  Other (See Comments) and Swelling    Patient reports facial edema and left arm edema after administration.   Wound Dressing Adhesive Rash   Bactoshield Chg [Chlorhexidine  Gluconate] Other (See Comments)    Per patient it makes me breakout   "

## 2024-05-11 NOTE — TOC Initial Note (Signed)
 Transition of Care [facility]) - Initial/Assessment Note    Patient Details  Name: Martin [name] MRN: [id number] Date of Birth: Nov 06, 1990  Transition of Care [facility]) CM/SW Contact:    Heather DELENA Saltness, LCSW Phone Number: 05/11/2024, 9:48 AM  Clinical Narrative:                 Pt admitted for sickle cell pain crisis, multiple readmissions. TOC consulted for medication assistance. Pt has [place]  Medicaid--does not qualify for MATCH. TOC unable to assist with medication assistance due to pt having insurance coverage. Pt lives with sister, but has no permanent address. Pt on home O2 at baseline, no other DME needs currently. TOC will continue to follow for discharge needs.   Expected Discharge Plan: Home/Self Care Barriers to Discharge: Continued Medical Work up   Patient Goals and CMS Choice Patient states their goals for this hospitalization and ongoing recovery are:: To return home   Choice offered to / list presented to : Patient [place] ownership interest in [facility].provided to:: Parent NA    Expected Discharge Plan and Services In-house Referral: Clinical Social Work Discharge Planning Services: NA Post Acute Care Choice: NA Living arrangements for the past 2 months: No permanent address                 DME Arranged: N/A DME Agency: NA       HH Arranged: NA HH Agency: NA        Prior Living Arrangements/Services Living arrangements for the past 2 months: No permanent address Lives with:: Relatives Patient language and need for interpreter reviewed:: Yes Do you feel safe going back to the place where you live?: Yes      Need for Family Participation in Patient Care: No (Comment) Care giver support system in place?: Yes (comment) Current home services: DME Criminal Activity/Legal Involvement Pertinent to Current Situation/Hospitalization: No - Comment as needed  Activities of Daily Living   ADL Screening (condition at time of  admission) Independently performs ADLs?: Yes (appropriate for developmental age) Is the patient deaf or have difficulty hearing?: No Does the patient have difficulty seeing, even when wearing glasses/contacts?: No Does the patient have difficulty concentrating, remembering, or making decisions?: No  Permission Sought/Granted Permission sought to share information with : Family Supports Permission granted to share information with : No   Emotional Assessment Appearance:: Appears stated age Attitude/Demeanor/Rapport: Engaged Affect (typically observed): Stable Orientation: : Oriented to Self, Oriented to Place, Oriented to  Time, Oriented to Situation Alcohol  / Substance Use: Not Applicable Psych Involvement: No (comment)  Admission diagnosis:  Sickle cell pain crisis (HCC) [D57.00] Acute chest syndrome (HCC) [D57.01] Patient Active Problem List   Diagnosis Date Noted   Congestive heart failure (HCC) 05/08/2024   Screening for lipid disorders 05/08/2024   Pain management contract signed 05/08/2024   Sickle cell anemia with crisis (HCC) 04/20/2024   Peripheral edema 03/25/2024   Pulmonary hypertension, primary (HCC) 03/21/2024   Edema 03/20/2024   Right shoulder pain 03/08/2024   Sickle cell pain crisis (HCC) 02/08/2024   Right flank pain 02/04/2024   Elevated brain natriuretic peptide (BNP) level 02/04/2024   Chest pain 02/04/2024   Acute cystitis with hematuria 02/04/2024   Muscle spasm 02/04/2024   Decrease in appetite 02/04/2024   Acute on chronic hypoxic respiratory failure (HCC) 01/16/2024   Chronic anticoagulation 12/18/2023   Sickle cell crisis (HCC) 10/12/2023   Chronic hypoxic respiratory failure (HCC) 08/04/2023   Iron overload, 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  Influenza A with pneumonia 06/20/2023   Acute hypoxemic respiratory failure (HCC) 06/20/2023   AKI (acute kidney injury) 06/20/2023   Influenza A with respiratory manifestations 06/20/2023   Acute on chronic  anemia 03/16/2023   History of HIV infection (HCC) 03/16/2023   Hypokalemia 03/16/2023   Anemia of chronic disease 07/13/2022   Hyperbilirubinemia 07/13/2022   Tinea capitis 07/29/2021   Bacteremia due to Enterococcus 08/29/2020   Acute chest syndrome (HCC) 08/26/2020   Hypoxia 07/09/2020   COVID-19 05/13/2020   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/18/2020   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   Abnormal penile discharge, without blood 02/22/2020   Leukocytosis 01/02/2020   Anxiety 11/27/2019   Proteinuria 11/27/2019   Acute on chronic respiratory failure with hypoxia (HCC) 11/16/2019   Chronic, continuous use of opioids 08/15/2019   Seasonal allergies 08/15/2019   Chest congestion    Chronic pain syndrome 08/04/2019   History of pulmonary embolism 08/04/2019   Acute pulmonary embolism (HCC) 02/10/2019   Acute chest syndrome due to sickle cell crisis (HCC) 02/10/2019   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) 03/09/2018   Bone pain 03/07/2018   Hip pain 03/07/2018   Acute bronchitis due to Streptococcus 02/21/2018   Heart murmur 02/03/2017   Sickle cell disease with crisis (HCC) 02/02/2017   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 [place] [address] Phone: [phone] Fax: [phone]  [place] [facility] [address] [place] [address] Phone: [phone] Fax: [phone]   Social Drivers of Health (SDOH) Social History: SDOH Screenings   Food  Insecurity: Food Insecurity Present (05/11/2024)  Housing: High Risk (05/11/2024)  Transportation Needs: Unmet Transportation Needs (05/11/2024)  Utilities: At Risk (05/11/2024)  Depression (PHQ2-9): Low Risk (05/08/2024)  Financial Resource Strain: Low Risk (02/21/2024)  Physical Activity: Sufficiently Active (10/09/2023)   Received from [facility]  Social Connections: Socially Isolated (03/04/2024)  Stress: No Stress Concern Present (10/09/2023)   Received from [facility]  Tobacco Use: Low Risk (05/10/2024)   SDOH Interventions: None     Readmission Risk Interventions    05/11/2024    9:46 AM 04/21/2024   12:42 PM 03/07/2024    9:22 AM  Readmission Risk Prevention Plan  Transportation Screening Complete Complete Complete  Medication Review Oceanographer) Complete Complete Complete  PCP or Specialist appointment within 3-5 days of discharge Complete Complete Complete  HRI or Home Care Consult Complete Complete Complete  SW Recovery Care/Counseling Consult Complete Complete Complete  Palliative Care Screening Not Applicable Not Applicable Not Applicable  Skilled Nursing Facility Not Applicable Not Applicable Not Applicable    Signed: Heather Saltness, MSW, LCSW Clinical Social Worker Inpatient Care Management 05/11/2024 9:53 AM

## 2024-05-11 NOTE — Plan of Care (Signed)

## 2024-05-12 ENCOUNTER — Other Ambulatory Visit: Payer: Self-pay | Admitting: Nurse Practitioner

## 2024-05-12 ENCOUNTER — Other Ambulatory Visit (HOSPITAL_COMMUNITY): Payer: Self-pay

## 2024-05-12 ENCOUNTER — Other Ambulatory Visit: Payer: Self-pay

## 2024-05-12 DIAGNOSIS — D571 Sickle-cell disease without crisis: Secondary | ICD-10-CM

## 2024-05-12 DIAGNOSIS — F1190 Opioid use, unspecified, uncomplicated: Secondary | ICD-10-CM

## 2024-05-12 LAB — HIV-1 RNA QUANT-NO REFLEX-BLD
HIV 1 RNA Quant: 290 {copies}/mL
LOG10 HIV-1 RNA: 2.462 {Log_copies}/mL

## 2024-05-12 MED ORDER — HEPARIN SOD (PORK) LOCK FLUSH 100 UNIT/ML IV SOLN
500.0000 [IU] | INTRAVENOUS | Status: AC | PRN
  Administered 2024-05-12: 500 [IU]
  Filled 2024-05-12: qty 5

## 2024-05-12 MED ORDER — OXYCODONE-ACETAMINOPHEN 10-325 MG PO TABS
1.0000 | ORAL_TABLET | ORAL | 0 refills | Status: AC | PRN
  Filled 2024-05-12: qty 90, 15d supply, fill #0

## 2024-05-12 NOTE — Discharge Summary (Signed)
 Physician Discharge Summary  Martin [name] FMW:[id number] DOB: 1991-01-19 DOA: 05/10/2024  PCP: [name], [name], [name]  Admit date: 05/10/2024  Discharge date: 05/12/2024  Discharge Diagnoses:  Active Problems:   Sickle cell pain crisis (HCC)   CTEPH (chronic thromboembolic pulmonary hypertension) (HCC)   Discharge Condition: Stable  Disposition:  Pt is discharged home in good condition and is to follow up with [name], [name], [name] this week to have labs evaluated. Martin [name] is instructed to increase activity slowly and balance with rest for the next few days, and use prescribed medication to complete treatment of pain  Diet: Regular Wt Readings from Last 3 Encounters:  05/12/24 70.1 kg  05/08/24 69.4 kg  04/20/24 63.5 kg    History of present illness:  Martin [name] is a 34 y.o. male with medical history significant for sickle cell disease, history of acute chest syndrome, multiple sickle cell crisis, chronic pain syndrome, generalized anxiety disorder, GERD, HIV, history of pulmonary embolism on Eliquis , chronic hypoxic respiratory failure on 2 L oxygen  nasal cannula at baseline, who presents to the ER with generalized body pain similar to prior sickle cell pain crisis.  Denies any cough or subjective fevers at home.  No chills.  No chest pain.  Has been feeling progressively short of breath.  Endorses a family history of premature coronary artery disease in his father who had an MI at the age of 23.  Presented to the ER for further evaluation.   ED course Temperature 98.8. BP 130/67, pulse 86, respiration rate 14, saturation 90% on room 4 L nasal cannula.  WBC 12.1, hemoglobin 9.6, HCT 26.9, reticulocyte count 12.6, total bili 6.7. EKG: Sinus rhythm LAE, consider biatrial enlargement probable inferior infarct, old no significant change since last tracing confirmed on 05/11/2024. Patient was treated in the emergency department with IV fluids, IV  pain medication with no resolution to symptoms.  Patient was admitted for sickle cell pain crisis and shortness of breath.  Hospital Course:  Patient was admitted for sickle cell pain crisis and managed appropriately with IVF, IV Dilaudid  via PCA and IV Toradol , while in the hospital hemoglobin remained stable with no clinical indication for transfusion.  Patient is tolerating food without nausea or vomiting or diarrhea, he is able to ambulate without assistance.  Patient was consulted by cardiology on this admission for ongoing/worsening shortness of breath and pulmonary hypertension.  According to Adams, Zane, PA-C  patient completed a VQ scan which said he had probable CTEPH based on his VQ scan.  This is likely responsible for his pulmonary hypertension which has improved somewhat compared to last October.  Patient also declined his right heart catheterization because he felt it will not offer any additional information.  Patient should follow-up outpatient in the heart failure clinic.  Recommendation is to continue diuresis.  Patient is reporting improved pain at baseline.  And asked to be discharged. He was therefore discharged home today in a hemodynamically stable condition.   Forrester will follow-up with PCP within 1 week of this discharge. Generoso was counseled extensively about nonpharmacologic means of pain management, patient verbalized understanding and was appreciative of  the care received during this admission.   We discussed the need for good hydration, monitoring of hydration status, avoidance of heat, cold, stress, and infection triggers. We discussed the need to be adherent with taking his medications. Patient was reminded of the need to seek medical attention immediately if any symptom of bleeding, anemia, or infection  occurs.  Discharge Exam: Vitals:   05/12/24 0800 05/12/24 0928  BP:  (!) 126/92  Pulse:  92  Resp: 14 10  Temp:  99.4 F (37.4 C)  SpO2: 91% 91%   Vitals:    05/12/24 0500 05/12/24 0613 05/12/24 0800 05/12/24 0928  BP:  108/85  (!) 126/92  Pulse:  89  92  Resp:  16 14 10   Temp:  98.5 F (36.9 C)  99.4 F (37.4 C)  TempSrc:  Oral  Oral  SpO2:  92% 91% 91%  Weight: 70.1 kg     Height:        General appearance : Awake, alert, not in any distress. Speech Clear. Not toxic looking HEENT: Atraumatic and Normocephalic, pupils equally reactive to light and accomodation Neck: Supple, no JVD. No cervical lymphadenopathy.  Chest: Good air entry bilaterally, no added sounds  CVS: S1 S2 regular, no murmurs.  Abdomen: Bowel sounds present, Non tender and not distended with no gaurding, rigidity or rebound. Extremities: B/L Lower Ext shows no edema, both legs are warm to touch Neurology: Awake alert, and oriented X 3, CN II-XII intact, Non focal Skin: No Rash  Discharge Instructions  Discharge Instructions     Call MD for:  severe uncontrolled pain   Complete by: As directed    Call MD for:  temperature >100.4   Complete by: As directed    Increase activity slowly   Complete by: As directed          The results of significant diagnostics from this hospitalization (including imaging, microbiology, ancillary and laboratory) are listed below for reference.    Significant Diagnostic Studies: ECHOCARDIOGRAM LIMITED Result Date: 05/11/2024    ECHOCARDIOGRAM LIMITED REPORT   Patient Name:   Martin [name] Date of Exam: 05/11/2024 Medical Rec #:  [id number]                Height:       77.0 in Accession #:    [id number]               Weight:       153.4 lb Date of Birth:  [birth year]                BSA:          1.999 m Patient Age:    33 years                 BP:           112/78 mmHg Patient Gender: M                        HR:           82 bpm. Exam Location:  Inpatient Procedure: Limited Echo, Cardiac Doppler and Color Doppler (Both Spectral and            Color Flow Doppler were utilized during procedure). Indications:    CHF  History:         Patient has prior history of Echocardiogram examinations, most                 recent 02/09/2024. Signs/Symptoms:Chest Pain and Murmur.  Sonographer:    Philomena Daring Referring Phys: [id number] CAROLE N HALL IMPRESSIONS  1. Limited Echocardiogram.  2. Left ventricular ejection fraction, by estimation, is 60 to 65%. The left ventricle has normal function. Left ventricular diastolic parameters were normal.  3. Right ventricular systolic function is  normal. The right ventricular size is grossly enlarged. There is moderately elevated pulmonary artery systolic pressure. The estimated right ventricular systolic pressure is 50.0 mmHg.  4. Right atrial size was grossly dilated.  5. Mild to moderate mitral valve regurgitation.  6. The aortic valve is tricuspid. Aortic valve regurgitation is not visualized. No aortic stenosis is present.  7. The inferior vena cava is dilated in size with <50% respiratory variability, suggesting right atrial pressure of 15 mmHg. Comparison(s): A prior study was performed on 02/09/2024. LVEF 65-70%, no RWMAs, normal diastolic function, RV severely enlarged, RV size mildly reduced, RVSP , RAE severe, moderate MR, moderate to severe TR, estimated RAP . FINDINGS  Left Ventricle: Left ventricular ejection fraction, by estimation, is 60 to 65%. The left ventricle has normal function. The left ventricular internal cavity size was normal in size. There is no left ventricular hypertrophy. Left ventricular diastolic parameters were normal. Right Ventricle: The right ventricular size is grossly enlarged. Right ventricular systolic function is normal. There is moderately elevated pulmonary artery systolic pressure. The tricuspid regurgitant velocity is 2.96 m/s, and with an assumed right atrial pressure of 15 mmHg, the estimated right ventricular systolic pressure is 50.0 mmHg. Right Atrium: Right atrial size was grossly dilated. Mitral Valve: Mild to moderate mitral valve regurgitation,  with posteriorly-directed jet. Tricuspid Valve: The tricuspid valve is normal in structure. Tricuspid valve regurgitation is mild . No evidence of tricuspid stenosis. Aortic Valve: The aortic valve is tricuspid. Aortic valve regurgitation is not visualized. No aortic stenosis is present. Pulmonic Valve: The pulmonic valve was normal in structure. Pulmonic valve regurgitation is trivial. No evidence of pulmonic stenosis. Aorta: The aortic root and ascending aorta are structurally normal, with no evidence of dilitation. Venous: The inferior vena cava is dilated in size with less than 50% respiratory variability, suggesting right atrial pressure of 15 mmHg. IAS/Shunts: There is left bowing of the interatrial septum, suggestive of elevated right atrial pressure. Additional Comments: Spectral Doppler performed. Color Doppler performed.  LEFT VENTRICLE PLAX 2D LVIDd:         5.10 cm   Diastology LVIDs:         3.30 cm   LV e' medial:    9.03 cm/s LV PW:         1.10 cm   LV E/e' medial:  9.0 LV IVS:        1.10 cm   LV e' lateral:   13.40 cm/s LVOT diam:     2.20 cm   LV E/e' lateral: 6.1 LV SV:         74 LV SV Index:   37 LVOT Area:     3.80 cm  RIGHT VENTRICLE             IVC RV S prime:     14.60 cm/s  IVC diam: 2.60 cm TAPSE (M-mode): 1.7 cm LEFT ATRIUM         Index LA diam:    4.20 cm 2.10 cm/m  AORTIC VALVE LVOT Vmax:   108.00 cm/s LVOT Vmean:  70.300 cm/s LVOT VTI:    0.194 m  AORTA Ao Root diam: 3.10 cm Ao Asc diam:  3.20 cm MITRAL VALVE               TRICUSPID VALVE MV Area (PHT): 5.13 cm    TR Peak grad:   35.0 mmHg MV Decel Time: 148 msec    TR Vmax:        296.00  cm/s MV E velocity: 81.40 cm/s MV A velocity: 54.80 cm/s  SHUNTS MV E/A ratio:  1.49        Systemic VTI:  0.19 m                            Systemic Diam: 2.20 cm Sunit Tolia Electronically signed by Madonna Large Signature Date/Time: 05/11/2024/1:47:00 PM    Final    NM Pulmonary Perfusion Result Date: 05/11/2024 EXAM: NM Lung Perfusion Scan.  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 TECHNIQUE: Radiolabeled MAA was administered intravenously and planar images of the lungs were obtained in multiple projections. RADIOPHARMACEUTICAL: 4.4 millicurie (technetium albumin  aggregated (MAA) injection solution 4.4 millicurie TECHNETIUM TO 64M ALBUMIN  AGGREGATED) COMPARISON: Comparison exam 05/09/2024. FINDINGS: PERFUSION: There are multiple small peripheral perfusion defects within the left and right lung. Defects involve the upper lobes and lower lobes. The perfusion abnormalities are greater than the pulmonary parenchymal changes on comparison radiograph. Multiple bilateral lung perfusion defects which for the most part are unmatched, findings consistent with chronic pulmonary thromboembolism. IMPRESSION: 1. Multiple bilateral small unmated peripheral perfusion defects. 2. 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 Electronically signed by: Norleen Boxer MD 05/11/2024 01:42 PM EST RP Workstation: [id number]   DG Chest 2 View Result Date: 05/11/2024 EXAM: 2 VIEW(S) XRAY OF THE CHEST 05/11/2024 12:12:19 AM COMPARISON: Portable chest recently 04/22/2024. CLINICAL HISTORY: Sickle cell crisis with shortness of breath. FINDINGS: LINES, TUBES AND DEVICES: There is a right IJ (internal jugular) port catheter again terminating at the superior cavoatrial junction. Overlying telemetry leads. LUNGS AND PLEURA: There is increased vascular engorgement, interval onset of mild interstitial edema with a basal gradient. Trace pleural effusions are beginning to form. Patchy perihilar hazy opacities are present on both sides and could be ground-glass edema or pneumonitis. Findings are superimposed on chronic lung changes. Clinical correlation and radiographic follow-up recommended. No pneumothorax. HEART AND  MEDIASTINUM: There is mild cardiomegaly with a prominent left main pulmonary artery segment and stable mediastinum. BONES AND SOFT TISSUES: No acute osseous abnormality. IMPRESSION: 1. Mild interstitial edema with a basal gradient and trace pleural effusions. Findings consistent with chf or fluid overload. Vascular engorgement also increased. 2. Patchy perihilar hazy opacities bilaterally, which may represent ground-glass edema or pneumonitis, superimposed on chronic lung changes; clinical correlation and radiographic follow-up recommended. Electronically signed by: Francis Quam MD 05/11/2024 12:19 AM EST RP Workstation: [id number]   DG CHEST PORT 1 VIEW Result Date: 04/22/2024 CLINICAL DATA:  Dyspnea EXAM: PORTABLE CHEST 1 VIEW COMPARISON:  Two days ago FINDINGS: Stable cardiomegaly. Right internal jugular Port-A-Cath is unchanged. Stable mild bibasilar interstitial densities concerning for possible chronic scarring and possible superimposed edema. Bony thorax is unremarkable. IMPRESSION: Stable mild bibasilar interstitial densities as described above. Electronically Signed   By: Lynwood Landy Raddle M.D.   On: 04/22/2024 12:40   DG Chest 2 View Result Date: 04/20/2024 EXAM: 2 VIEW(S) XRAY OF THE CHEST 04/20/2024 12:51:02 PM COMPARISON: Chest x-ray 03/05/2024, CXR 11/07/2023, CT chest 02/08/2024. CLINICAL HISTORY: chest pain, dyspnea, sickle cell FINDINGS: LINES, TUBES AND DEVICES: Right IJ Port-A-Cath stable in position. LUNGS AND PLEURA: Persistent prominent hilar vasculature and coarsened interstitial markings. Persistent Patchy airspace opacities of the lungs. No pleural effusion. No pneumothorax. HEART AND MEDIASTINUM: Mild cardiomegaly, unchanged. No acute abnormality of the mediastinal silhouette. BONES AND SOFT TISSUES:  No acute osseous abnormality. IMPRESSION: 1. Mild pulmonary edema with underlying chronic coarsening interstitial markings and patchy airspace opacities. 2. Right IJ Port-A-Cath in stable  position. Electronically signed by: Kate Plummer MD 04/20/2024 01:40 PM EST RP Workstation: [id number]    Microbiology: Recent Results (from the past 240 hours)  Chlamydia/Gonococcus/Trichomonas, NAA     Status: None   Collection Time: 05/08/24  3:54 PM   Specimen: Urine   UR  Result Value Ref Range Status   Chlamydia by NAA Negative Negative Final   Gonococcus by NAA Negative Negative Final   Trich vag by NAA Negative Negative Final     Labs: Basic Metabolic Panel: Recent Labs  Lab 05/08/24 1330 05/10/24 2348 05/11/24 0502  NA 145* 141 139  K 5.0 3.7 4.0  CL 110* 111 108  CO2 19* 21* 23  GLUCOSE 81 124* 103*  BUN 9 17 14   CREATININE 0.90 0.91 0.93  CALCIUM  8.8 8.0* 8.5*  MG  --   --  1.8  PHOS  --   --  6.8*   Liver Function Tests: Recent Labs  Lab 05/08/24 1330 05/10/24 2348 05/11/24 0502  AST 46* 61* 55*  ALT 25 31 32  ALKPHOS 96 92 93  BILITOT 7.2* 6.7* 5.2*  PROT 7.8 7.7 7.8  ALBUMIN  3.6* 3.5 3.6   No results for input(s): LIPASE, AMYLASE in the last 168 hours. No results for input(s): AMMONIA in the last 168 hours. CBC: Recent Labs  Lab 05/08/24 1330 05/10/24 2348 05/11/24 0502  WBC 9.5 12.1* 13.7*  NEUTROABS 3.5 4.3 5.7  HGB 10.6* 9.6* 9.3*  HCT 31.3* 26.9* 27.9*  MCV 101* 96.8 100.4*  PLT 232 273 248   Cardiac Enzymes: No results for input(s): CKTOTAL, CKMB, CKMBINDEX, TROPONINI in the last 168 hours. BNP: Invalid input(s): POCBNP CBG: No results for input(s): GLUCAP in the last 168 hours.  Time coordinating discharge: 50 minutes  Signed:  [name] Cover  [facility] 05/12/2024, 12:13 PM

## 2024-05-12 NOTE — Progress Notes (Signed)
 Discharge medications delivered to patient at the bedside in a secure bag.

## 2024-05-12 NOTE — Progress Notes (Signed)
 Heart Failure Navigator Progress Note  Assessed for Heart & Vascular TOC clinic readiness.   Patient is scheduled for new patient visit with Dr. Zenaida on 2/3 regarding management of his pulmonary hypertension. No HF TOC.   Duwaine Plant, PharmD, BCPS Heart Failure Stewardship Pharmacist Phone [phone]

## 2024-05-12 NOTE — Telephone Encounter (Signed)
 Copied from CRM #[id number]. Topic: Clinical - Prescription Issue >> May 12, 2024 10:17 AM Gattis SQUIBB wrote: Reason for CRM: Pt cancelled the oxycodone  rx at [facility] ,  They do not have it in stock.  He wants it sent to [facility]  Please Advise.  CB.

## 2024-05-12 NOTE — Discharge Instructions (Signed)
 SABRA

## 2024-05-12 NOTE — Plan of Care (Signed)
" °  Problem: Education: Goal: Knowledge of vaso-occlusive preventative measures will improve Outcome: Progressing Goal: Awareness of infection prevention will improve Outcome: Progressing Goal: Awareness of signs and symptoms of anemia will improve Outcome: Completed/Met Goal: Long-term complications will improve Outcome: Progressing   Problem: Self-Care: Goal: Ability to incorporate actions that prevent/reduce pain crisis will improve Outcome: Adequate for Discharge   Problem: Bowel/Gastric: Goal: Gut motility will be maintained Outcome: Progressing   Problem: Tissue Perfusion: Goal: Complications related to inadequate tissue perfusion will be avoided or minimized Outcome: Progressing   Problem: Respiratory: Goal: Pulmonary complications will be avoided or minimized Outcome: Progressing Goal: Acute Chest Syndrome will be identified early to prevent complications Outcome: Progressing   Problem: Fluid Volume: Goal: Ability to maintain a balanced intake and output will improve Outcome: Progressing   Problem: Sensory: Goal: Pain level will decrease with appropriate interventions Outcome: Progressing   Problem: Health Behavior: Goal: Postive changes in compliance with treatment and prescription regimens will improve Outcome: Progressing   Problem: Education: Goal: Knowledge of General Education information will improve Description: Including pain rating scale, medication(s)/side effects and non-pharmacologic comfort measures Outcome: Progressing   Problem: Health Behavior/Discharge Planning: Goal: Ability to manage health-related needs will improve Outcome: Progressing   Problem: Clinical Measurements: Goal: Ability to maintain clinical measurements within normal limits will improve Outcome: Progressing Goal: Will remain free from infection Outcome: Progressing Goal: Diagnostic test results will improve Outcome: Progressing Goal: Respiratory complications will  improve Outcome: Progressing Goal: Cardiovascular complication will be avoided Outcome: Progressing   Problem: Activity: Goal: Risk for activity intolerance will decrease Outcome: Progressing   Problem: Nutrition: Goal: Adequate nutrition will be maintained Outcome: Completed/Met   Problem: Coping: Goal: Level of anxiety will decrease Outcome: Progressing   Problem: Elimination: Goal: Will not experience complications related to bowel motility Outcome: Progressing Goal: Will not experience complications related to urinary retention Outcome: Completed/Met   Problem: Pain Managment: Goal: General experience of comfort will improve and/or be controlled Outcome: Progressing   Problem: Safety: Goal: Ability to remain free from injury will improve Outcome: Progressing   Problem: Skin Integrity: Goal: Risk for impaired skin integrity will decrease Outcome: Progressing   "

## 2024-05-12 NOTE — TOC Transition Note (Signed)
 Transition of Care [facility]) - Discharge Note   Patient Details  Name: Martin [name] MRN: [id number] Date of Birth: 25-Sep-1990  [facility] CM/SW Contact:  Sonda Manuella Quill, RN Phone Number: 05/12/2024, 11:54 AM   Clinical Narrative:    D/C orders received; no IP CM needs.   Final next level of care: Home/Self Care Barriers to Discharge: No Barriers Identified   Patient Goals and CMS Choice Patient states their goals for this hospitalization and ongoing recovery are:: To return home   Choice offered to / list presented to : Patient [place] ownership interest in [facility].provided to:: Parent NA    Discharge Placement                       Discharge Plan and Services Additional resources added to the After Visit Summary for   In-house Referral: Clinical Social Work Discharge Planning Services: NA Post Acute Care Choice: NA          DME Arranged: N/A DME Agency: NA       HH Arranged: NA HH Agency: NA        Social Drivers of Health (SDOH) Interventions SDOH Screenings   Food Insecurity: Food Insecurity Present (05/11/2024)  Housing: High Risk (05/11/2024)  Transportation Needs: Unmet Transportation Needs (05/11/2024)  Utilities: At Risk (05/11/2024)  Depression (PHQ2-9): Low Risk (05/08/2024)  Financial Resource Strain: Low Risk (02/21/2024)  Physical Activity: Sufficiently Active (10/09/2023)   Received from [facility]  Social Connections: Socially Isolated (03/04/2024)  Stress: No Stress Concern Present (10/09/2023)   Received from [facility]  Tobacco Use: Low Risk (05/10/2024)     Readmission Risk Interventions    05/11/2024    9:46 AM 04/21/2024   12:42 PM 03/07/2024    9:22 AM  Readmission Risk Prevention Plan  Transportation Screening Complete Complete Complete  Medication Review Oceanographer) Complete Complete Complete  PCP or Specialist appointment within 3-5 days of discharge Complete  Complete Complete  HRI or Home Care Consult Complete Complete Complete  SW Recovery Care/Counseling Consult Complete Complete Complete  Palliative Care Screening Not Applicable Not Applicable Not Applicable  Skilled Nursing Facility Not Applicable Not Applicable Not Applicable

## 2024-05-12 NOTE — Progress Notes (Signed)
 Tele box removed and placed in designated area in med room as per protocol. CCMD notified

## 2024-05-12 NOTE — Progress Notes (Cosign Needed)
 Medication refill sent to [facility]. PDMP website Reviewed.

## 2024-05-13 LAB — TOXASSURE FLEX 15, UR
6-ACETYLMORPHINE IA: NEGATIVE ng/mL
7-aminoclonazepam: NOT DETECTED ng/mg{creat}
AMPHETAMINES IA: NEGATIVE ng/mL
Alpha-hydroxyalprazolam: NOT DETECTED ng/mg{creat}
Alpha-hydroxymidazolam: NOT DETECTED ng/mg{creat}
Alpha-hydroxytriazolam: NOT DETECTED ng/mg{creat}
Alprazolam: NOT DETECTED ng/mg{creat}
BARBITURATES IA: NEGATIVE ng/mL
BUPRENORPHINE: NEGATIVE
Benzodiazepines: NEGATIVE
Buprenorphine: NOT DETECTED ng/mg{creat}
CANNABINOIDS IA: NEGATIVE ng/mL
COCAINE METABOLITE IA: NEGATIVE ng/mL
Clonazepam: NOT DETECTED ng/mg{creat}
Creatinine: 53 mg/dL
Desalkylflurazepam: NOT DETECTED ng/mg{creat}
Desmethyldiazepam: NOT DETECTED ng/mg{creat}
Desmethylflunitrazepam: NOT DETECTED ng/mg{creat}
Diazepam: NOT DETECTED ng/mg{creat}
ETHYL ALCOHOL Enzymatic: NEGATIVE g/dL
FENTANYL: NEGATIVE
Fentanyl: NOT DETECTED ng/mg{creat}
Flunitrazepam: NOT DETECTED ng/mg{creat}
Lorazepam: NOT DETECTED ng/mg{creat}
METHADONE IA: NEGATIVE ng/mL
METHADONE MTB IA: NEGATIVE ng/mL
Midazolam: NOT DETECTED ng/mg{creat}
Norbuprenorphine: NOT DETECTED ng/mg{creat}
Norfentanyl: NOT DETECTED ng/mg{creat}
Oxazepam: NOT DETECTED ng/mg{creat}
PHENCYCLIDINE IA: NEGATIVE ng/mL
TAPENTADOL, IA: NEGATIVE ng/mL
TRAMADOL IA: NEGATIVE ng/mL
Temazepam: NOT DETECTED ng/mg{creat}

## 2024-05-13 LAB — OPIATE CLASS, MS, UR RFX
Codeine: NOT DETECTED ng/mg{creat}
Dihydrocodeine: NOT DETECTED ng/mg{creat}
Hydrocodone: NOT DETECTED ng/mg{creat}
Hydromorphone: NOT DETECTED ng/mg{creat}
Morphine: NOT DETECTED ng/mg{creat}
Norcodeine: NOT DETECTED ng/mg{creat}
Norhydrocodone: NOT DETECTED ng/mg{creat}
Normorphine: NOT DETECTED ng/mg{creat}
Opiate Class Confirmation: NEGATIVE

## 2024-05-13 LAB — OXYCODONE CLASS, MS, UR RFX
Noroxycodone: 2147 ng/mg{creat}
Noroxymorphone: 883 ng/mg{creat}
Oxycodone Class Confirmation: POSITIVE
Oxycodone: 257 ng/mg{creat}
Oxymorphone: 1528 ng/mg{creat}

## 2024-05-15 ENCOUNTER — Telehealth: Payer: Self-pay

## 2024-05-15 ENCOUNTER — Other Ambulatory Visit (HOSPITAL_COMMUNITY): Payer: Self-pay

## 2024-05-15 NOTE — Patient Instructions (Addendum)
 Martin [name] - I am sorry I was unable to reach you today for our scheduled appointment. I work with [name], [name], [name] and am calling to support your healthcare needs. Please contact me at [phone] at your earliest convenience. I look forward to speaking with you soon.   Thank you,  Elida Pulse, RNCM Case Manager [facility], Population Health Direct Dial: [phone]

## 2024-05-15 NOTE — Transitions of Care (Post Inpatient/ED Visit) (Signed)
" ° °  05/15/2024  Name: Martin [name] MRN: [id number] DOB: 1990/12/16  Today's TOC FU Call Status: Today's TOC FU Call Status:: Unsuccessful Call (1st Attempt) Unsuccessful Call (1st Attempt) Date: 05/15/24  Attempted to reach the patient regarding the most recent Inpatient/ED visit.  Follow Up Plan: Additional outreach attempts will be made to reach the patient to complete the Transitions of Care (Post Inpatient/ED visit) call.   Arvin Seip RN, BSN, CCM [employer], Population Health Case Manager Phone: [phone]  "

## 2024-05-16 ENCOUNTER — Telehealth: Payer: Self-pay

## 2024-05-16 NOTE — Transitions of Care (Post Inpatient/ED Visit) (Signed)
" ° °  05/16/2024  Name: Martin [name] MRN: [id number] DOB: 1991-03-24  Today's TOC FU Call Status: Today's TOC FU Call Status:: Unsuccessful Call (2nd Attempt) Unsuccessful Call (2nd Attempt) Date: 05/16/24  Attempted to reach the patient regarding the most recent Inpatient/ED visit.  Follow Up Plan: Additional outreach attempts will be made to reach the patient to complete the Transitions of Care (Post Inpatient/ED visit) call.   Arvin Seip RN, BSN, CCM [employer], Population Health Case Manager Phone: [phone]  "

## 2024-05-17 ENCOUNTER — Telehealth: Payer: Self-pay

## 2024-05-17 ENCOUNTER — Other Ambulatory Visit (HOSPITAL_COMMUNITY): Payer: Self-pay

## 2024-05-17 NOTE — Transitions of Care (Post Inpatient/ED Visit) (Signed)
" ° °  05/17/2024  Name: Martin [name] MRN: [id number] DOB: 11/04/1990  Today's TOC FU Call Status: Today's TOC FU Call Status:: Unsuccessful Call (3rd Attempt) Unsuccessful Call (3rd Attempt) Date: 05/17/24  Attempted to reach the patient regarding the most recent Inpatient/ED visit.  Follow Up Plan: No further outreach attempts will be made at this time. We have been unable to contact the patient.  Arvin Seip RN, BSN, CCM [employer], Population Health Case Manager Phone: [phone]  "

## 2024-05-17 NOTE — Telephone Encounter (Signed)
 [facility] Patient Advocate Encounter  Insurance verification completed.    The patient is insured through [place]  [employer] .     Ran test claim for [id number] The current 30 day co-pay is Unable to see copay's due to them having out of state Medicaid ([place] ).    We will continue to follow to see if copay assistance is needed.  This test claim was processed through [facility]- copay amounts may vary at other pharmacies due to pharmacy/plan contracts, or as the patient moves through the different stages of their insurance plan.

## 2024-05-18 ENCOUNTER — Encounter (HOSPITAL_COMMUNITY): Payer: Self-pay | Admitting: Emergency Medicine

## 2024-05-18 ENCOUNTER — Inpatient Hospital Stay (HOSPITAL_COMMUNITY)
Admission: EM | Admit: 2024-05-18 | Discharge: 2024-05-26 | Disposition: A | Source: Home / Self Care | Attending: Internal Medicine | Admitting: Internal Medicine

## 2024-05-18 ENCOUNTER — Other Ambulatory Visit: Payer: Self-pay

## 2024-05-18 ENCOUNTER — Ambulatory Visit: Payer: Self-pay | Admitting: Family

## 2024-05-18 ENCOUNTER — Emergency Department (HOSPITAL_COMMUNITY)

## 2024-05-18 ENCOUNTER — Ambulatory Visit: Payer: Self-pay

## 2024-05-18 DIAGNOSIS — D5700 Hb-SS disease with crisis, unspecified: Principal | ICD-10-CM | POA: Diagnosis present

## 2024-05-18 DIAGNOSIS — J101 Influenza due to other identified influenza virus with other respiratory manifestations: Secondary | ICD-10-CM | POA: Insufficient documentation

## 2024-05-18 DIAGNOSIS — R079 Chest pain, unspecified: Secondary | ICD-10-CM | POA: Diagnosis present

## 2024-05-18 DIAGNOSIS — J111 Influenza due to unidentified influenza virus with other respiratory manifestations: Secondary | ICD-10-CM | POA: Insufficient documentation

## 2024-05-18 DIAGNOSIS — J9621 Acute and chronic respiratory failure with hypoxia: Secondary | ICD-10-CM | POA: Diagnosis present

## 2024-05-18 DIAGNOSIS — N179 Acute kidney failure, unspecified: Secondary | ICD-10-CM | POA: Diagnosis present

## 2024-05-18 LAB — CBC WITH DIFFERENTIAL/PLATELET

## 2024-05-18 MED ORDER — DEXTROSE 5 % AND 0.45 % NACL IV BOLUS
1000.0000 mL | Freq: Once | INTRAVENOUS | Status: AC
  Administered 2024-05-18: 1000 mL via INTRAVENOUS

## 2024-05-18 MED ORDER — ACETAMINOPHEN 500 MG PO TABS
1000.0000 mg | ORAL_TABLET | Freq: Once | ORAL | Status: AC
  Administered 2024-05-18: 1000 mg via ORAL
  Filled 2024-05-18: qty 2

## 2024-05-18 MED ORDER — HYDROMORPHONE HCL 1 MG/ML IJ SOLN
2.0000 mg | INTRAMUSCULAR | Status: AC | PRN
  Administered 2024-05-18 – 2024-05-19 (×3): 2 mg via INTRAVENOUS
  Filled 2024-05-18 (×3): qty 2

## 2024-05-18 NOTE — ED Triage Notes (Signed)
 Pt c/o SCC with pain all over. O2  in triage 80% on RA, placed on 4lpm [place]. O2 sat increased to 86%. Increased O2 to 6lpm, O2 sat 94%.

## 2024-05-19 DIAGNOSIS — J101 Influenza due to other identified influenza virus with other respiratory manifestations: Secondary | ICD-10-CM

## 2024-05-19 LAB — CBC WITH DIFFERENTIAL/PLATELET
Abs Immature Granulocytes: 0.22 10*3/uL — ABNORMAL HIGH (ref 0.00–0.07)
Basophils Absolute: 0.1 10*3/uL (ref 0.0–0.1)
Basophils Relative: 1 %
Eosinophils Absolute: 0.3 10*3/uL (ref 0.0–0.5)
Eosinophils Relative: 2 %
HCT: 22.1 % — ABNORMAL LOW (ref 39.0–52.0)
Hemoglobin: 8.1 g/dL — ABNORMAL LOW (ref 13.0–17.0)
Immature Granulocytes: 2 %
Lymphocytes Relative: 33 %
Lymphs Abs: 4.8 10*3/uL — ABNORMAL HIGH (ref 0.7–4.0)
MCH: 33.1 pg (ref 26.0–34.0)
MCHC: 36.7 g/dL — ABNORMAL HIGH (ref 30.0–36.0)
MCV: 90.2 fL (ref 80.0–100.0)
Monocytes Absolute: 1.3 10*3/uL — ABNORMAL HIGH (ref 0.1–1.0)
Monocytes Relative: 9 %
Neutro Abs: 8 10*3/uL — ABNORMAL HIGH (ref 1.7–7.7)
Neutrophils Relative %: 53 %
Platelets: 452 10*3/uL — ABNORMAL HIGH (ref 150–400)
RBC: 2.45 MIL/uL — ABNORMAL LOW (ref 4.22–5.81)
RDW: 27 % — ABNORMAL HIGH (ref 11.5–15.5)
WBC: 14.7 10*3/uL — ABNORMAL HIGH (ref 4.0–10.5)
nRBC: 126.3 % — ABNORMAL HIGH (ref 0.0–0.2)

## 2024-05-19 LAB — COMPREHENSIVE METABOLIC PANEL WITH GFR
ALT: 108 U/L — ABNORMAL HIGH (ref 0–44)
ALT: 120 U/L — ABNORMAL HIGH (ref 0–44)
AST: 203 U/L — ABNORMAL HIGH (ref 15–41)
AST: 224 U/L — ABNORMAL HIGH (ref 15–41)
Albumin: 3.5 g/dL (ref 3.5–5.0)
Albumin: 3.5 g/dL (ref 3.5–5.0)
Alkaline Phosphatase: 111 U/L (ref 38–126)
Alkaline Phosphatase: 119 U/L (ref 38–126)
Anion gap: 11 (ref 5–15)
Anion gap: 9 (ref 5–15)
BUN: 50 mg/dL — ABNORMAL HIGH (ref 6–20)
BUN: 55 mg/dL — ABNORMAL HIGH (ref 6–20)
CO2: 20 mmol/L — ABNORMAL LOW (ref 22–32)
CO2: 22 mmol/L (ref 22–32)
Calcium: 8 mg/dL — ABNORMAL LOW (ref 8.9–10.3)
Calcium: 8.2 mg/dL — ABNORMAL LOW (ref 8.9–10.3)
Chloride: 101 mmol/L (ref 98–111)
Chloride: 101 mmol/L (ref 98–111)
Creatinine, Ser: 1.4 mg/dL — ABNORMAL HIGH (ref 0.61–1.24)
Creatinine, Ser: 1.62 mg/dL — ABNORMAL HIGH (ref 0.61–1.24)
GFR, Estimated: 57 mL/min — ABNORMAL LOW
GFR, Estimated: >60 mL/min
Glucose, Bld: 105 mg/dL — ABNORMAL HIGH (ref 70–99)
Glucose, Bld: 110 mg/dL — ABNORMAL HIGH (ref 70–99)
Potassium: 4.1 mmol/L (ref 3.5–5.1)
Potassium: 4.3 mmol/L (ref 3.5–5.1)
Sodium: 132 mmol/L — ABNORMAL LOW (ref 135–145)
Sodium: 133 mmol/L — ABNORMAL LOW (ref 135–145)
Total Bilirubin: 6 mg/dL — ABNORMAL HIGH (ref 0.0–1.2)
Total Bilirubin: 7.1 mg/dL — ABNORMAL HIGH (ref 0.0–1.2)
Total Protein: 8.2 g/dL — ABNORMAL HIGH (ref 6.5–8.1)
Total Protein: 8.5 g/dL — ABNORMAL HIGH (ref 6.5–8.1)

## 2024-05-19 LAB — URINALYSIS, COMPLETE (UACMP) WITH MICROSCOPIC
Bilirubin Urine: NEGATIVE
Glucose, UA: NEGATIVE mg/dL
Ketones, ur: NEGATIVE mg/dL
Leukocytes,Ua: NEGATIVE
Nitrite: NEGATIVE
Protein, ur: 30 mg/dL — AB
Specific Gravity, Urine: 1.013 (ref 1.005–1.030)
pH: 5 (ref 5.0–8.0)

## 2024-05-19 LAB — CBC
HCT: 22.5 % — ABNORMAL LOW (ref 39.0–52.0)
Hemoglobin: 7.7 g/dL — ABNORMAL LOW (ref 13.0–17.0)
MCH: 31.2 pg (ref 26.0–34.0)
MCHC: 34.2 g/dL (ref 30.0–36.0)
MCV: 91.1 fL (ref 80.0–100.0)
Platelets: 397 10*3/uL (ref 150–400)
RBC: 2.47 MIL/uL — ABNORMAL LOW (ref 4.22–5.81)
RDW: 27.7 % — ABNORMAL HIGH (ref 11.5–15.5)
WBC: 17 10*3/uL — ABNORMAL HIGH (ref 4.0–10.5)
nRBC: 132.6 % — ABNORMAL HIGH (ref 0.0–0.2)

## 2024-05-19 LAB — PRO BRAIN NATRIURETIC PEPTIDE: Pro Brain Natriuretic Peptide: 3064 pg/mL — ABNORMAL HIGH

## 2024-05-19 LAB — TYPE AND SCREEN
ABO/RH(D): O POS
Antibody Screen: NEGATIVE

## 2024-05-19 LAB — RETICULOCYTES
Immature Retic Fract: 13.2 % (ref 2.3–15.9)
RBC.: 2.5 MIL/uL — ABNORMAL LOW (ref 4.22–5.81)
Retic Count, Absolute: 379.5 10*3/uL — ABNORMAL HIGH (ref 19.0–186.0)
Retic Ct Pct: 15.2 % — ABNORMAL HIGH (ref 0.4–3.1)

## 2024-05-19 LAB — PROCALCITONIN: Procalcitonin: 0.51 ng/mL

## 2024-05-19 LAB — TROPONIN T, HIGH SENSITIVITY: Troponin T High Sensitivity: 25 ng/L — ABNORMAL HIGH (ref 0–19)

## 2024-05-19 LAB — I-STAT CG4 LACTIC ACID, ED: Lactic Acid, Venous: 0.4 mmol/L — ABNORMAL LOW (ref 0.5–1.9)

## 2024-05-19 LAB — RESP PANEL BY RT-PCR (RSV, FLU A&B, COVID)  RVPGX2
Influenza A by PCR: NEGATIVE
Influenza B by PCR: POSITIVE — AB
Resp Syncytial Virus by PCR: NEGATIVE
SARS Coronavirus 2 by RT PCR: NEGATIVE

## 2024-05-19 MED ORDER — GUAIFENESIN-DM 100-10 MG/5ML PO SYRP
5.0000 mL | ORAL_SOLUTION | ORAL | Status: DC | PRN
  Administered 2024-05-19 – 2024-05-26 (×25): 5 mL via ORAL
  Filled 2024-05-19 (×25): qty 10

## 2024-05-19 MED ORDER — DIPHENHYDRAMINE HCL 12.5 MG/5ML PO ELIX: 12.5000 mg | ORAL_SOLUTION | Freq: Four times a day (QID) | ORAL | Status: DC | PRN

## 2024-05-19 MED ORDER — ONDANSETRON HCL 4 MG/2ML IJ SOLN: 4.0000 mg | Freq: Four times a day (QID) | INTRAMUSCULAR | Status: DC | PRN

## 2024-05-19 MED ORDER — APIXABAN 5 MG PO TABS
5.0000 mg | ORAL_TABLET | Freq: Two times a day (BID) | ORAL | Status: DC
  Administered 2024-05-19 – 2024-05-26 (×15): 5 mg via ORAL
  Filled 2024-05-19 (×15): qty 1

## 2024-05-19 MED ORDER — HYDROMORPHONE 1 MG/ML IV SOLN
INTRAVENOUS | Status: DC
  Administered 2024-05-19: 3.5 mg via INTRAVENOUS
  Administered 2024-05-19 – 2024-05-20 (×2): 30 mg via INTRAVENOUS
  Administered 2024-05-20: 3.3 mg via INTRAVENOUS
  Administered 2024-05-20: 1.8 mg via INTRAVENOUS
  Administered 2024-05-20: 30 mg via INTRAVENOUS
  Filled 2024-05-19: qty 30

## 2024-05-19 MED ORDER — ALBUTEROL SULFATE (2.5 MG/3ML) 0.083% IN NEBU: 2.5000 mg | INHALATION_SOLUTION | Freq: Four times a day (QID) | RESPIRATORY_TRACT | Status: DC | PRN

## 2024-05-19 MED ORDER — HYDROMORPHONE HCL 1 MG/ML IJ SOLN
2.0000 mg | INTRAMUSCULAR | Status: DC | PRN
  Administered 2024-05-19: 2 mg via INTRAVENOUS
  Filled 2024-05-19 (×2): qty 2

## 2024-05-19 MED ORDER — POLYETHYLENE GLYCOL 3350 17 G PO PACK: 17.0000 g | PACK | Freq: Every day | ORAL | Status: DC | PRN

## 2024-05-19 MED ORDER — SODIUM CHLORIDE 0.9% FLUSH: 9.0000 mL | INTRAVENOUS | Status: DC | PRN

## 2024-05-19 MED ORDER — FOLIC ACID 1 MG PO TABS
1.0000 mg | ORAL_TABLET | Freq: Every day | ORAL | Status: DC
  Administered 2024-05-19 – 2024-05-26 (×8): 1 mg via ORAL
  Filled 2024-05-19 (×8): qty 1

## 2024-05-19 MED ORDER — ALBUTEROL SULFATE HFA 108 (90 BASE) MCG/ACT IN AERS: 2.0000 | INHALATION_SPRAY | Freq: Four times a day (QID) | RESPIRATORY_TRACT | Status: DC | PRN

## 2024-05-19 MED ORDER — ABACAVIR-DOLUTEGRAVIR-LAMIVUD 600-50-300 MG PO TABS
1.0000 | ORAL_TABLET | Freq: Every day | ORAL | Status: DC
  Filled 2024-05-19: qty 1

## 2024-05-19 MED ORDER — SENNOSIDES-DOCUSATE SODIUM 8.6-50 MG PO TABS
1.0000 | ORAL_TABLET | Freq: Two times a day (BID) | ORAL | Status: DC
  Administered 2024-05-20 – 2024-05-24 (×4): 1 via ORAL
  Filled 2024-05-19 (×13): qty 1

## 2024-05-19 MED ORDER — NALOXONE HCL 0.4 MG/ML IJ SOLN: 0.4000 mg | INTRAMUSCULAR | Status: DC | PRN

## 2024-05-19 MED ORDER — SODIUM CHLORIDE 0.9 % IV SOLN
1.0000 g | INTRAVENOUS | Status: DC
  Administered 2024-05-19: 1 g via INTRAVENOUS
  Filled 2024-05-19: qty 10

## 2024-05-19 MED ORDER — ACETAMINOPHEN 325 MG PO TABS
650.0000 mg | ORAL_TABLET | Freq: Four times a day (QID) | ORAL | Status: DC | PRN
  Administered 2024-05-19 – 2024-05-20 (×2): 650 mg via ORAL
  Filled 2024-05-19 (×2): qty 2

## 2024-05-19 MED ORDER — HYDROXYUREA 500 MG PO CAPS
2000.0000 mg | ORAL_CAPSULE | Freq: Every day | ORAL | Status: DC
  Administered 2024-05-19 – 2024-05-25 (×7): 2000 mg via ORAL
  Filled 2024-05-19 (×7): qty 4

## 2024-05-19 MED ORDER — OSELTAMIVIR PHOSPHATE 75 MG PO CAPS
75.0000 mg | ORAL_CAPSULE | Freq: Once | ORAL | Status: AC
  Administered 2024-05-19: 75 mg via ORAL
  Filled 2024-05-19: qty 1

## 2024-05-19 MED ORDER — DIPHENHYDRAMINE HCL 50 MG/ML IJ SOLN: 12.5000 mg | Freq: Four times a day (QID) | INTRAMUSCULAR | Status: DC | PRN

## 2024-05-19 MED ORDER — OSELTAMIVIR PHOSPHATE 75 MG PO CAPS
75.0000 mg | ORAL_CAPSULE | Freq: Two times a day (BID) | ORAL | Status: AC
  Administered 2024-05-19 – 2024-05-23 (×9): 75 mg via ORAL
  Filled 2024-05-19 (×9): qty 1

## 2024-05-19 MED ORDER — HYDROMORPHONE HCL 1 MG/ML IJ SOLN
2.0000 mg | Freq: Once | INTRAMUSCULAR | Status: AC
  Administered 2024-05-19: 2 mg via INTRAVENOUS
  Filled 2024-05-19: qty 2

## 2024-05-19 MED ORDER — SODIUM CHLORIDE 0.9 % IV SOLN
100.0000 mg | Freq: Two times a day (BID) | INTRAVENOUS | Status: DC
  Administered 2024-05-19: 100 mg via INTRAVENOUS
  Filled 2024-05-19: qty 100

## 2024-05-19 NOTE — Progress Notes (Signed)
 Patient ID: Martin [name], male   DOB: 1990/07/21, 34 y.o.   MRN: [id number] Martin [name] is a 34 y.o. male with medical history significant of sickle cell disease, acute chest syndrome, multiple sickle cell crisis, chronic pain syndrome, generalized anxiety disorder, GERD, HIV, history of PE on Eliquis , chronic thromboembolic pulmonary hypertension, HFpEF, chronic hypoxemic respiratory failure on home oxygen  presenting with a chief complaint of severe generalized body aches/pain.  Symptoms started 3 days ago.  Also reporting subjective fevers and chills, cough, shortness of breath, and ongoing nonexertional generalized pain across his chest.  Patient states he has not eaten anything 4 days but did eat a sandwich in the ED.  Denies vomiting, diarrhea, or abdominal pain.  He reports compliance with his home medications including Eliquis .  He normally uses 4 L oxygen  at home as needed but has required 5 L continuously for the past few days.   While rounding on patient this morning this provider asked patient; how are you doing this morning patient responded I am in trouble Patient rates pain as 10/10.  No other concerns.

## 2024-05-19 NOTE — H&P (Signed)
 " History and Physical    Martin [name] FMW:[id number] DOB: November 21, 1990 DOA: 05/18/2024  PCP: [name], [name], [name]  Patient coming from: Home  Chief Complaint: Generalized body aches  HPI: Martin [name] is a 34 y.o. male with medical history significant of sickle cell disease, acute chest syndrome, multiple sickle cell crisis, chronic pain syndrome, generalized anxiety disorder, GERD, HIV, history of PE on Eliquis , chronic thromboembolic pulmonary hypertension, HFpEF, chronic hypoxemic respiratory failure on home oxygen  presenting with a chief complaint of severe generalized body aches/pain.  Symptoms started 3 days ago.  Also reporting subjective fevers and chills, cough, shortness of breath, and ongoing nonexertional generalized pain across his chest.  Patient states he has not eaten anything 4 days but did eat a sandwich in the ED.  Denies vomiting, diarrhea, or abdominal pain.  He reports compliance with his home medications including Eliquis .  He normally uses 4 L oxygen  at home as needed but has required 5 L continuously for the past few days.  ED Course: SpO2 80% on room air on arrival and improved to 94% with 6 L [place].  Afebrile.  Labs notable for WBC count 14.7, hemoglobin 8.1, platelet count 452k, sodium 133, bicarb 20, anion gap 11, glucose 110, BUN 55, creatinine 1.62, calcium  8.2, albumin  3.5, total protein 8.5, AST 224, ALT 120, alk phos 119, T. bili 7.1 (chronically elevated and close to baseline), influenza B PCR positive.  Chest x-ray showing mild pulmonary edema which appears improved since prior study.  EKG showing sinus rhythm, ST depression/T wave inversions anterolaterally similar to previous EKGs but ST depression/T wave inversions in inferior leads appear new.  No troponin checked.  Patient was given Tylenol , Tamiflu , IV fluids, and multiple doses of IV Dilaudid .  Review of Systems:  Review of Systems  All other systems reviewed and are negative.   Past  Medical History:  Diagnosis Date   Anxiety    Chest pain 02/04/2024   HIV (human immunodeficiency virus infection) (HCC)    Proteinuria    Sickle cell disease (HCC)    Vitamin D  deficiency 10/2018    Past Surgical History:  Procedure Laterality Date   IR IMAGING GUIDED PORT INSERTION  08/29/2019   IR REMOVAL TUN ACCESS W/ PORT W/O FL MOD SED  08/30/2020   TEE WITHOUT CARDIOVERSION N/A 08/30/2020   Procedure: TRANSESOPHAGEAL ECHOCARDIOGRAM (TEE);  Surgeon: Mona Vinie BROCKS, MD;  Location: [facility] ENDOSCOPY;  Service: Cardiovascular;  Laterality: N/A;     reports that he has never smoked. He has never used smokeless tobacco. He reports that he does not currently use drugs after having used the following drugs: Marijuana. He reports that he does not drink alcohol .  Allergies[1]  Family History  Problem Relation Age of Onset   Sickle cell trait Mother    Sickle cell trait Father    Birth defects Maternal Grandmother    Birth defects Paternal Grandmother     Prior to Admission medications  Medication Sig Start Date End Date Taking? Authorizing Provider  abacavir -dolutegravir -lamiVUDine  (TRIUMEQ ) 600-50-300 MG tablet Take 1 tablet by mouth daily. Patient not taking: Reported on 05/11/2024 05/08/24   [name], [name], [name]  albuterol  (VENTOLIN  HFA) 108 (90 Base) MCG/ACT inhaler Inhale 2 puffs into the lungs every 6 (six) hours as needed for wheezing or shortness of breath. 08/13/23   Pearlean Manus, MD  amoxicillin -clavulanate (AUGMENTIN ) 875-125 MG tablet Take 1 tablet by mouth 2 (two) times daily. Patient not taking: Reported on 05/11/2024  [provider]  apixaban  (ELIQUIS ) 5 MG TABS tablet Take 5 mg by mouth 2 (two) times daily.    [provider]  cyclobenzaprine  (FLEXERIL ) 5 MG tablet Take 1 tablet (5 mg total) by mouth 2 (two) times daily as needed for muscle spasms. Patient taking differently: Take 5 mg by mouth daily as needed for muscle spasms. 02/04/24    [name], [name], [name]  Deferasirox  (JADENU ) 360 MG TABS Take 2 tablets (720 mg total) by mouth daily. Patient not taking: Reported on 05/11/2024 05/08/24   [name], [name], [name]  Dextromethorphan -guaiFENesin  (ROBAFEN DM) 20-200 MG/20ML LIQD Take 10 mLs by mouth every 4 (four) hours as needed (chest congestion). Patient not taking: Reported on 05/11/2024 05/01/24   Jegede, Olugbemiga E, MD  diphenhydrAMINE  (BENADRYL ) 25 MG tablet Take 1 tablet (25 mg total) by mouth every 4 (four) hours as needed for itching. Patient taking differently: Take 25 mg by mouth daily as needed for itching or allergies. 08/13/23   Pearlean Manus, MD  folic acid  (FOLVITE ) 1 MG tablet Take 1 tablet (1 mg total) by mouth daily. Patient not taking: Reported on 05/11/2024 05/08/24   [name], [name], [name]  gabapentin  (NEURONTIN ) 300 MG capsule Take 1 capsule (300 mg total) by mouth at bedtime as needed (nerve pain). Patient taking differently: Take 300 mg by mouth 3 (three) times daily as needed (nerve pain). 08/13/23   Pearlean Manus, MD  guaiFENesin -dextromethorphan  (ROBITUSSIN DM) 100-10 MG/5ML syrup Take 10 mLs by mouth every 4 (four) hours as needed for cough. Patient not taking: Reported on 05/11/2024 03/12/24   Jegede, Olugbemiga E, MD  hydroxyurea  (HYDREA ) 500 MG capsule Take 4 capsules (2,000 mg total) by mouth at bedtime. 08/13/23   Pearlean Manus, MD  NARCAN  4 MG/0.1ML LIQD nasal spray kit Place 1 spray into the nose as needed (respiratory distress / opioid overdose). 01/07/23   [provider]  oxyCODONE -acetaminophen  (PERCOCET) 10-325 MG tablet Take 1 tablet by mouth every 4 (four) hours as needed for pain. 05/12/24   Ijaola, Onyeje M, NP  pantoprazole  (PROTONIX ) 40 MG tablet Take 1 tablet (40 mg total) by mouth daily as needed (acid reflux). Patient not taking: Reported on 05/11/2024 08/13/23   Pearlean Manus, MD  senna-docusate (SENOKOT-S) 8.6-50 MG tablet Take 2 tablets by mouth at bedtime. Patient  not taking: Reported on 05/08/2024 08/13/23   Pearlean Manus, MD  Vitamin D , Ergocalciferol , (DRISDOL ) 1.25 MG (50000 UNIT) CAPS capsule Take 1 capsule (50,000 Units total) by mouth every 7 (seven) days. Patient not taking: Reported on 05/11/2024 05/08/24   [name], [name], [name]    Physical Exam: Vitals:   05/18/24 2248 05/19/24 0030 05/19/24 0100 05/19/24 0200  BP:  103/67 125/68 (!) 118/91  Pulse:  98 94 96  Resp:  18 18 18   Temp:  98.2 F (36.8 C)    SpO2: 95% 93% 93% 92%    Physical Exam Vitals reviewed.  Constitutional:      Appearance: He is ill-appearing.  HENT:     Head: Normocephalic and atraumatic.  Eyes:     Extraocular Movements: Extraocular movements intact.  Cardiovascular:     Rate and Rhythm: Normal rate and regular rhythm.     Heart sounds: Normal heart sounds.  Pulmonary:     Effort: Pulmonary effort is normal. No respiratory distress.     Breath sounds: Rales present.     Comments: Bibasilar rales Abdominal:     General: Bowel sounds are normal.  Palpations: Abdomen is soft.     Tenderness: There is no abdominal tenderness. There is no guarding.  Musculoskeletal:     Cervical back: Normal range of motion.     Right lower leg: No edema.     Left lower leg: No edema.  Skin:    General: Skin is warm and dry.  Neurological:     General: No focal deficit present.     Mental Status: He is alert and oriented to person, place, and time.     Labs on Admission: I have personally reviewed following labs and imaging studies  CBC: Recent Labs  Lab 05/18/24 2330  WBC 14.7*  NEUTROABS 8.0*  HGB 8.1*  HCT 22.1*  MCV 90.2  PLT 452*   Basic Metabolic Panel: Recent Labs  Lab 05/18/24 2330  NA 133*  K 4.1  CL 101  CO2 20*  GLUCOSE 110*  BUN 55*  CREATININE 1.62*  CALCIUM  8.2*   GFR: Estimated Creatinine Clearance: 63.7 mL/min (A) (by C-G formula based on SCr of 1.62 mg/dL (H)). Liver Function Tests: Recent Labs  Lab 05/18/24 2330  AST  224*  ALT 120*  ALKPHOS 119  BILITOT 7.1*  PROT 8.5*  ALBUMIN  3.5   No results for input(s): LIPASE, AMYLASE in the last 168 hours. No results for input(s): AMMONIA in the last 168 hours. Coagulation Profile: No results for input(s): INR, PROTIME in the last 168 hours. Cardiac Enzymes: No results for input(s): CKTOTAL, CKMB, CKMBINDEX, TROPONINI in the last 168 hours. BNP (last 3 results) Recent Labs    01/16/24 1816 05/10/24 2348  PROBNP 2,143.0* 957.0*   HbA1C: No results for input(s): HGBA1C in the last 72 hours. CBG: No results for input(s): GLUCAP in the last 168 hours. Lipid Profile: No results for input(s): CHOL, HDL, LDLCALC, [name], CHOLHDL, LDLDIRECT in the last 72 hours. Thyroid Function Tests: No results for input(s): TSH, T4TOTAL, FREET4, T3FREE, THYROIDAB in the last 72 hours. Anemia Panel: Recent Labs    05/18/24 2330  RETICCTPCT 15.2*   Urine analysis:    Component Value Date/Time   COLORURINE YELLOW 04/10/2024 0527   APPEARANCEUR CLEAR 04/10/2024 0527   LABSPEC 1.010 04/10/2024 0527   PHURINE 6.0 04/10/2024 0527   GLUCOSEU NEGATIVE 04/10/2024 0527   HGBUR NEGATIVE 04/10/2024 0527   BILIRUBINUR NEGATIVE 04/10/2024 0527   BILIRUBINUR moderate (A) 02/04/2024 1613   BILIRUBINUR neg 11/27/2019 1149   KETONESUR NEGATIVE 04/10/2024 0527   PROTEINUR NEGATIVE 04/10/2024 0527   UROBILINOGEN 2.0 (A) 02/04/2024 1613   UROBILINOGEN 4.0 (H) 05/21/2009 1944   NITRITE NEGATIVE 04/10/2024 0527   LEUKOCYTESUR NEGATIVE 04/10/2024 0527    Radiological Exams on Admission: DG Chest Portable 1 View Result Date: 05/18/2024 EXAM: 1 VIEW XRAY OF THE CHEST 05/18/2024 11:57:00 PM COMPARISON: 05/11/2024 CLINICAL HISTORY: Shortness of breath/PE. FINDINGS: LINES, TUBES AND DEVICES: Stable right chest wall port with tip overlying the superior cavoatrial junction. LUNGS AND PLEURA: Mild pulmonary edema. Edema is improved since prior  study. Chronic bilateral interstitial prominence. No pleural effusion. No pneumothorax. HEART AND MEDIASTINUM: Cardiomegaly, unchanged. BONES AND SOFT TISSUES: No acute osseous abnormality. IMPRESSION: 1. Mild pulmonary edema, improved since prior study. 2. Chronic bilateral interstitial prominence. 3. Cardiomegaly, unchanged. Electronically signed by: Elsie Gravely MD 05/18/2024 11:59 PM EST RP Workstation: [id number]    Assessment and Plan  Influenza B infection Acute on chronic hypoxemic respiratory failure ?Acute chest syndrome Patient is presenting with 3-day history of severe generalized bodyaches/pain, subjective fevers and chills, cough, shortness  of breath, and chest pain.  Afebrile in the ED but does have mild leukocytosis on labs.  Mild tachycardia has now resolved after IV fluids.  Not hypotensive.  No signs of sepsis at this time.  Influenza B PCR positive.  Chest x-ray showing mild pulmonary edema which appears improved since prior study.  Patient is currently requiring 5 L [place] to maintain oxygen  saturation in the 90s.  He normally uses 4 L home oxygen  PRN.  No respiratory distress.  Continue 5-day course of Tamiflu .  Will start ceftriaxone  and doxycycline  to cover for possible bacterial infection and check procalcitonin level.  Incentive spirometry, bronchodilator as needed.  Continuous pulse ox, continue supplemental oxygen .  Droplet and contact precautions.  Blood cultures ordered.  Trend WBC count.  Sickle cell pain crisis Patient received 1 L IV fluids in the ED.  Given pulmonary edema on chest x-ray, will check BNP before giving additional IV fluids.  Patient continues to endorse severe generalized pain despite receiving multiple doses of IV Dilaudid  in the ED.  Start IV Dilaudid  PCA.  Monitor vital signs closely and reevaluate pain scale regularly.  Continue home hydroxyurea  and folic acid .  Chest pain with new EKG changes Patient is endorsing nonexertional generalized pain across  his entire chest which has been ongoing for the past 3 days.  EKG showing new ST depressions/T wave inversions in inferior leads.  Stat troponin ordered.  AKI Suspect dehydration in the setting of acute illness but does have pulmonary edema on imaging so will hold off additional IV fluids until BNP is checked.  BUN 55, creatinine 1.62 (baseline 0.7-1.0).  Continue to monitor renal function closely and avoid nephrotoxic agents/NSAIDs.  UA ordered.  Anemia of chronic disease Hemoglobin close to baseline.  Type and screen, continue to monitor H&H.  Mild hyponatremia Continue to monitor labs.  Mild normal anion gap metabolic acidosis In the setting of AKI.  Continue to monitor labs.  Elevated liver enzymes Transaminases above baseline. ?Ischemic hepatitis from vaso-occlusive crisis.  No abdominal pain or tenderness.  Continue to monitor LFTs closely and avoid hepatotoxic agents.  Chronic HFpEF Chronic thromboembolic pulmonary hypertension Recent echo done during hospitalization a week ago showing EF 60 to 65%, normal diastolic parameters, RV systolic function normal, RV size grossly enlarged, moderately elevated pulmonary artery systolic pressure, right atrial size grossly dilated, mild to moderate mitral regurgitation, and mild tricuspid regurgitation.  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  Patient had declined right heart catheterization.  Chest x-ray done at present continues to show pulmonary edema.  Check BNP.  History of PE Continue Eliquis .  HIV Recent CD4 count 1020 on labs a week ago.  Continue Triumeq .  History of PE Continue Eliquis .  Home medications initiated per patient's reported history.  Awaiting pharmacy med rec for verification.   DVT prophylaxis: Eliquis  Code Status: Full Code (discussed with the patient) Level of care: Progressive Care Unit Admission status: It is my clinical opinion that admission to INPATIENT is  reasonable and necessary because of the expectation that this patient will require hospital care that crosses at least 2 midnights to treat this condition based on the medical complexity of the problems presented.  Given the aforementioned information, the predictability of an adverse outcome is felt to be significant.  Editha Ram MD [facility]  If 7PM-7AM, please contact night-coverage [email or web address]  05/19/2024, 4:02 AM       [1]  Allergies Allergen Reactions   Ketorolac   Tromethamine  Swelling and Other (See Comments)    Patient reports facial edema and left arm edema after administration.   Tape Other (See Comments), Rash and Dermatitis    PLEASE DO NOT USE THE CLEAR, THICK, PLASTIC TAPE- only paper tape is tolerated  Paper Tape = ok   Ketorolac  Other (See Comments) and Swelling    Patient reports facial edema and left arm edema after administration.   Wound Dressing Adhesive Rash   Bactoshield Chg [Chlorhexidine  Gluconate] Other (See Comments)    Per patient it makes me breakout   "

## 2024-05-19 NOTE — ED Notes (Addendum)
 Attempted to call report - Spoke with Charge Nurse who is planning to review Hand off.

## 2024-05-19 NOTE — ED Notes (Addendum)
 Patient transported to floor.

## 2024-05-19 NOTE — Progress Notes (Signed)
" °   05/19/24 2231  Assess: MEWS Score  Temp (!) 101.8 F (38.8 C)  BP 109/73  MAP (mmHg) 86  Pulse Rate (!) 101  Resp 14  SpO2 94 %  O2 Device Nasal Cannula  O2 Flow Rate (L/min) 7 L/min  Assess: MEWS Score  MEWS Temp 2  MEWS Systolic 0  MEWS Pulse 1  MEWS RR 0  MEWS LOC 0  MEWS Score 3  MEWS Score Color Yellow  Assess: if the MEWS score is Yellow or Red  Were vital signs accurate and taken at a resting state? Yes  Does the patient meet 2 or more of the SIRS criteria? Yes  Does the patient have a confirmed or suspected source of infection? Yes  MEWS guidelines implemented  Yes, yellow  Treat  MEWS Interventions Considered administering scheduled or prn medications/treatments as ordered  Take Vital Signs  Increase Vital Sign Frequency  Yellow: Q2hr x1, continue Q4hrs until patient remains green for 12hrs  Escalate  MEWS: Escalate Yellow: Discuss with charge nurse and consider notifying provider and/or RRT  Notify: Charge Nurse/RN  Name of Charge Nurse/RN Notified Kristine, RN  Provider Notification  Provider Name/Title Lavanda Horns, NP  Date Provider Notified 05/19/24  Time Provider Notified 2239  Method of Notification Page  Notification Reason Other (Comment) (Yellow MEWs)  Provider response See new orders  Date of Provider Response 05/19/24  Time of Provider Response 2243  Assess: SIRS CRITERIA  SIRS Temperature  1  SIRS Respirations  0  SIRS Pulse 1  SIRS WBC 0  SIRS Score Sum  2    "

## 2024-05-19 NOTE — Plan of Care (Signed)

## 2024-05-20 DIAGNOSIS — J101 Influenza due to other identified influenza virus with other respiratory manifestations: Secondary | ICD-10-CM | POA: Diagnosis not present

## 2024-05-20 DIAGNOSIS — D5700 Hb-SS disease with crisis, unspecified: Secondary | ICD-10-CM

## 2024-05-20 DIAGNOSIS — J111 Influenza due to unidentified influenza virus with other respiratory manifestations: Secondary | ICD-10-CM | POA: Diagnosis not present

## 2024-05-20 MED ORDER — SODIUM CHLORIDE 0.9% FLUSH: 9.0000 mL | INTRAVENOUS | Status: DC | PRN

## 2024-05-20 MED ORDER — ONDANSETRON HCL 4 MG/2ML IJ SOLN
4.0000 mg | Freq: Four times a day (QID) | INTRAMUSCULAR | Status: DC | PRN
  Administered 2024-05-23 – 2024-05-25 (×2): 4 mg via INTRAVENOUS
  Filled 2024-05-20 (×2): qty 2

## 2024-05-20 MED ORDER — DIPHENHYDRAMINE HCL 25 MG PO CAPS: 25.0000 mg | ORAL_CAPSULE | ORAL | Status: DC | PRN

## 2024-05-20 MED ORDER — SODIUM CHLORIDE 0.9% FLUSH: 10.0000 mL | INTRAVENOUS | Status: DC | PRN

## 2024-05-20 MED ORDER — OXYCODONE HCL 5 MG PO TABS
5.0000 mg | ORAL_TABLET | ORAL | Status: DC | PRN
  Administered 2024-05-22 – 2024-05-26 (×15): 5 mg via ORAL
  Filled 2024-05-20 (×16): qty 1

## 2024-05-20 MED ORDER — SODIUM CHLORIDE 0.9% FLUSH
10.0000 mL | Freq: Two times a day (BID) | INTRAVENOUS | Status: DC
  Administered 2024-05-20: 10 mL

## 2024-05-20 MED ORDER — OXYCODONE-ACETAMINOPHEN 5-325 MG PO TABS
1.0000 | ORAL_TABLET | ORAL | Status: DC | PRN
  Administered 2024-05-22 – 2024-05-26 (×15): 1 via ORAL
  Filled 2024-05-20 (×16): qty 1

## 2024-05-20 MED ORDER — CYCLOBENZAPRINE HCL 5 MG PO TABS: 5.0000 mg | ORAL_TABLET | Freq: Two times a day (BID) | ORAL | Status: DC | PRN

## 2024-05-20 MED ORDER — HYDROMORPHONE 1 MG/ML IV SOLN
INTRAVENOUS | Status: DC
  Administered 2024-05-20: 30 mg via INTRAVENOUS
  Administered 2024-05-21: 3.5 mg via INTRAVENOUS
  Administered 2024-05-21: 14 mg via INTRAVENOUS
  Administered 2024-05-21: 6 mg via INTRAVENOUS
  Administered 2024-05-21: 30 mg via INTRAVENOUS
  Administered 2024-05-21: 4.5 mg via INTRAVENOUS
  Administered 2024-05-22: 30 mg via INTRAVENOUS
  Administered 2024-05-22: 9 mg via INTRAVENOUS
  Administered 2024-05-22: 4.5 mg via INTRAVENOUS
  Administered 2024-05-22: 4 mg via INTRAVENOUS
  Administered 2024-05-22: 30 mg via INTRAVENOUS
  Administered 2024-05-23: 6.5 mg via INTRAVENOUS
  Administered 2024-05-23: 1.5 mg via INTRAVENOUS
  Administered 2024-05-23: 30 mg via INTRAVENOUS
  Administered 2024-05-23: 3 mg via INTRAVENOUS
  Administered 2024-05-24: 4 mg via INTRAVENOUS
  Filled 2024-05-20 (×4): qty 30

## 2024-05-20 MED ORDER — OXYCODONE-ACETAMINOPHEN 10-325 MG PO TABS: 1.0000 | ORAL_TABLET | ORAL | Status: DC | PRN

## 2024-05-20 MED ORDER — NALOXONE HCL 0.4 MG/ML IJ SOLN: 0.4000 mg | INTRAMUSCULAR | Status: DC | PRN

## 2024-05-20 NOTE — Plan of Care (Signed)

## 2024-05-20 NOTE — Progress Notes (Signed)
 CHG allergy noted in chart. Pt reports allergy to CHG baths. States CHG included in port access has been fine. Routine line care orders entered excluding CHG baths.

## 2024-05-20 NOTE — Progress Notes (Signed)
 Patients  Port-a-cath is leaking, Vascular team consult initiated.

## 2024-05-20 NOTE — Progress Notes (Signed)
 Patient ID: Martin [name], male   DOB: 12/23/1990, 34 y.o.   MRN: [id number] Subjective: Martin [name]  is a 34 y.o. male with history of sickle cell disease, history of acute chest syndrome, multiple sickle cell crisis, multiple ED visits and hospital admission, history of PE currently on Eliquis , chronic hypoxic respiratory failure on home oxygen , HIV infection and generalized anxiety disorder who was admitted for influenza B infection with acute on chronic hypoxic respiratory failure in the setting of sickle cell pain crisis.   Patient says his pain remains the same, has not gotten any relief.  He thinks his fever has subsided.  He is having left arm and shoulder pain and he only relief position is raising his left arm up.  There is no swelling or redness.  There is no central chest pain comes with his cough.  Cough is not productive at this time.  Patient is currently on Tamiflu  and antibiotics, on droplet and contact precautions.  He denies any nausea, vomiting or diarrhea. No urinary symptom.   Objective:  Vital signs in last 24 hours:  Vitals:   05/20/24 0808 05/20/24 0915 05/20/24 0940 05/20/24 1201  BP:   (!) 90/51   Pulse:      Resp: 18 18  20   Temp:   98.7 F (37.1 C)   TempSrc:   Oral   SpO2:  96% 97% 97%  Weight:      Height:        Intake/Output from previous day:   Intake/Output Summary (Last 24 hours) at 05/20/2024 1319 Last data filed at 05/20/2024 0900 Gross per 24 hour  Intake 276.8 ml  Output 2380 ml  Net -2103.2 ml    Physical Exam: General: Chronically ill looking, temporal wasting.  Alert, awake, oriented x3, in no acute distress.  HEENT: [place]/AT [place], EOMI.  Icteric ++, mildly pale. Neck: Trachea midline,  no masses, no thyromegal,y no JVD, no carotid bruit OROPHARYNX:  Moist, No exudate/ erythema/lesions.  Heart: Regular rate and rhythm, without murmurs, rubs, gallops, PMI non-displaced, no heaves or thrills on palpation.  Lungs: Clear to  auscultation, no wheezing or rhonchi noted. No increased vocal fremitus resonant to percussion  Abdomen: Soft, nontender, nondistended, positive bowel sounds, no masses no hepatosplenomegaly noted..  Neuro: No focal neurological deficits noted cranial nerves II through XII grossly intact. DTRs 2+ bilaterally upper and lower extremities. Strength 5 out of 5 in bilateral upper and lower extremities. Musculoskeletal: No warm swelling or erythema around joints, no spinal tenderness noted. Psychiatric: Patient alert and oriented x3, good insight and cognition, good recent to remote recall. Lymph node survey: No cervical axillary or inguinal lymphadenopathy noted.  Lab Results:  Basic Metabolic Panel:    Component Value Date/Time   NA 132 (L) 05/19/2024 0605   NA 145 (H) 05/08/2024 1330   K 4.3 05/19/2024 0605   CL 101 05/19/2024 0605   CO2 22 05/19/2024 0605   BUN 50 (H) 05/19/2024 0605   BUN 9 05/08/2024 1330   CREATININE 1.40 (H) 05/19/2024 0605   GLUCOSE 105 (H) 05/19/2024 0605   CALCIUM  8.0 (L) 05/19/2024 0605   CBC:    Component Value Date/Time   WBC 17.0 (H) 05/19/2024 0605   HGB 7.7 (L) 05/19/2024 0605   HGB 10.6 (L) 05/08/2024 1330   HCT 22.5 (L) 05/19/2024 0605   HCT 31.3 (L) 05/08/2024 1330   PLT 397 05/19/2024 0605   PLT 232 05/08/2024 1330   MCV 91.1 05/19/2024 0605  MCV 101 (H) 05/08/2024 1330   NEUTROABS 8.0 (H) 05/18/2024 2330   NEUTROABS 3.5 05/08/2024 1330   LYMPHSABS 4.8 (H) 05/18/2024 2330   LYMPHSABS 4.8 (H) 05/08/2024 1330   MONOABS 1.3 (H) 05/18/2024 2330   EOSABS 0.3 05/18/2024 2330   EOSABS 0.2 05/08/2024 1330   BASOSABS 0.1 05/18/2024 2330   BASOSABS 0.1 05/08/2024 1330    Recent Results (from the past 240 hours)  Resp panel by RT-PCR (RSV, Flu A&B, Covid) Anterior Nasal Swab     Status: Abnormal   Collection Time: 05/18/24 11:41 PM   Specimen: Anterior Nasal Swab  Result Value Ref Range Status   SARS Coronavirus 2 by RT PCR NEGATIVE NEGATIVE  Final    Comment: (NOTE) SARS-CoV-2 target nucleic acids are NOT DETECTED.  The SARS-CoV-2 RNA is generally detectable in upper respiratory specimens during the acute phase of infection. The lowest concentration of SARS-CoV-2 viral copies this assay can detect is 138 copies/mL. A negative result does not preclude SARS-Cov-2 infection and should not be used as the sole basis for treatment or other patient management decisions. A negative result may occur with  improper specimen collection/handling, submission of specimen other than nasopharyngeal swab, presence of viral mutation(s) within the areas targeted by this assay, and inadequate number of viral copies(<138 copies/mL). A negative result must be combined with clinical observations, patient history, and epidemiological information. The expected result is Negative.  Fact Sheet for Patients:  bloggercourse.com  Fact Sheet for Healthcare Providers:  seriousbroker.it  This test is no t yet approved or cleared by the [place]  FDA and  has been authorized for detection and/or diagnosis of SARS-CoV-2 by FDA under an Emergency Use Authorization (EUA). This EUA will remain  in effect (meaning this test can be used) for the duration of the COVID-19 declaration under Section 564(b)(1) of the Act, 21 U.S.C.section 360bbb-3(b)(1), unless the authorization is terminated  or revoked sooner.       Influenza A by PCR NEGATIVE NEGATIVE Final   Influenza B by PCR POSITIVE (A) NEGATIVE Final    Comment: (NOTE) The Xpert Xpress SARS-CoV-2/FLU/RSV plus assay is intended as an aid in the diagnosis of influenza from Nasopharyngeal swab specimens and should not be used as a sole basis for treatment. Nasal washings and aspirates are unacceptable for Xpert Xpress SARS-CoV-2/FLU/RSV testing.  Fact Sheet for Patients: bloggercourse.com  Fact Sheet for Healthcare  Providers: seriousbroker.it  This test is not yet approved or cleared by the [place]  FDA and has been authorized for detection and/or diagnosis of SARS-CoV-2 by FDA under an Emergency Use Authorization (EUA). This EUA will remain in effect (meaning this test can be used) for the duration of the COVID-19 declaration under Section 564(b)(1) of the Act, 21 U.S.C. section 360bbb-3(b)(1), unless the authorization is terminated or revoked.     Resp Syncytial Virus by PCR NEGATIVE NEGATIVE Final    Comment: (NOTE) Fact Sheet for Patients: bloggercourse.com  Fact Sheet for Healthcare Providers: seriousbroker.it  This test is not yet approved or cleared by the [place]  FDA and has been authorized for detection and/or diagnosis of SARS-CoV-2 by FDA under an Emergency Use Authorization (EUA). This EUA will remain in effect (meaning this test can be used) for the duration of the COVID-19 declaration under Section 564(b)(1) of the Act, 21 U.S.C. section 360bbb-3(b)(1), unless the authorization is terminated or revoked.  Performed at [facility], [address] [place], [place] [address]   Culture, blood (Routine X 2)  w Reflex to ID Panel     Status: None (Preliminary result)   Collection Time: 05/19/24  6:05 AM   Specimen: BLOOD  Result Value Ref Range Status   Specimen Description   Final    BLOOD BLOOD LEFT HAND Performed at [facility], [address], [place], [place] [address]    Special Requests   Final    BOTTLES DRAWN AEROBIC AND ANAEROBIC Blood Culture adequate volume Performed at [facility], [address], [place], [place] [address]    Culture   Final    NO GROWTH < 24 HOURS Performed at [facility], [address], [place], [place] [address]    Report Status PENDING  Incomplete  Culture, blood (Routine X 2) w Reflex to  ID Panel     Status: None (Preliminary result)   Collection Time: 05/19/24  2:41 PM   Specimen: BLOOD  Result Value Ref Range Status   Specimen Description   Final    BLOOD BLOOD RIGHT HAND Performed at [facility], [address], [place], [place] [address]    Special Requests   Final    BOTTLES DRAWN AEROBIC AND ANAEROBIC Blood Culture results may not be optimal due to an inadequate volume of blood received in culture bottles Performed at [facility], [address], [place], [place] [address]    Culture   Final    NO GROWTH < 24 HOURS Performed at [facility], [address] [place], [place] [address]    Report Status PENDING  Incomplete    Studies/Results: DG Chest Portable 1 View Result Date: 05/18/2024 EXAM: 1 VIEW XRAY OF THE CHEST 05/18/2024 11:57:00 PM COMPARISON: 05/11/2024 CLINICAL HISTORY: Shortness of breath/PE. FINDINGS: LINES, TUBES AND DEVICES: Stable right chest wall port with tip overlying the superior cavoatrial junction. LUNGS AND PLEURA: Mild pulmonary edema. Edema is improved since prior study. Chronic bilateral interstitial prominence. No pleural effusion. No pneumothorax. HEART AND MEDIASTINUM: Cardiomegaly, unchanged. BONES AND SOFT TISSUES: No acute osseous abnormality. IMPRESSION: 1. Mild pulmonary edema, improved since prior study. 2. Chronic bilateral interstitial prominence. 3. Cardiomegaly, unchanged. Electronically signed by: Elsie Gravely MD 05/18/2024 11:59 PM EST RP Workstation: [id number]    Medications: Scheduled Meds:  apixaban   5 mg Oral BID   folic acid   1 mg Oral Daily   HYDROmorphone    Intravenous Q4H   hydroxyurea   2,000 mg Oral QHS   oseltamivir   75 mg Oral BID   senna-docusate  1 tablet Oral BID   sodium chloride  flush  10-40 mL Intracatheter Q12H   Continuous Infusions: PRN Meds:.acetaminophen , albuterol , diphenhydrAMINE , guaiFENesin -dextromethorphan , naloxone  **AND** sodium chloride   flush, ondansetron  (ZOFRAN ) IV, polyethylene glycol, sodium chloride  flush  Consultants: None  Procedures: None  Antibiotics: None  Assessment/Plan: Active Problems:   Acute on chronic respiratory failure with hypoxia (HCC)   AKI (acute kidney injury)   Influenza B   Chest pain   Sickle cell pain crisis (HCC)  Influenza B infection: Patient is on Tamiflu .  No fever.  Continue symptomatic support. Acute on chronic hypoxemic respiratory failure: Patient is saturating well on supplemental oxygen .  No more shortness of breath or use of accessory muscles.  Will continue supportive care.  Incentive spirometry. Hb Sickle Cell Disease with Pain crisis: Reduce IVF to KVO.  Increase weight based Dilaudid  PCA to 0.5/10/3 setting, IV Toradol  is contraindicated due to AKI.  Continue oral home pain medications as ordered. Monitor vitals very  closely, Re-evaluate pain scale regularly, 2 L of Oxygen  by [place]. Patient encouraged to ambulate on the hallway today.  Acute kidney injury: Most likely from dehydration in the setting of acute sickle cell pain crisis.  Improved.  Continue to avoid all nephrotoxins. Leukocytosis:  Anemia of Chronic Disease: Hemoglobin is stable at baseline today.  Patient has been typed and screened.  There is no clinical indication for blood transfusion at this time.  Will continue to monitor closely and transfuse as appropriate.   Chronic pain Syndrome: Continue oral home pain medications as ordered. History of pulmonary embolism: Continue Eliquis . Chronic thromboembolic pulmonary hypertension: Patient has missed multiple appointments with cardiologist.  On his previous admission, cardiologist advised outpatient right heart cath.  Patient encouraged to follow-up once discharged again. HIV infection: Not entirely clear if patient is adherent with his medication.  He is on Triumeq , will continue.  Code Status: Full Code Family Communication: N/A Disposition Plan: Not yet ready  for discharge  Blanchie Zeleznik  If 7PM-7AM, please contact night-coverage.  05/20/2024, 1:19 PM  LOS: 1 day

## 2024-05-21 DIAGNOSIS — D5700 Hb-SS disease with crisis, unspecified: Secondary | ICD-10-CM | POA: Diagnosis not present

## 2024-05-21 DIAGNOSIS — J101 Influenza due to other identified influenza virus with other respiratory manifestations: Secondary | ICD-10-CM | POA: Diagnosis not present

## 2024-05-21 LAB — CBC
HCT: 22.3 % — ABNORMAL LOW (ref 39.0–52.0)
Hemoglobin: 7.6 g/dL — ABNORMAL LOW (ref 13.0–17.0)
MCH: 32.3 pg (ref 26.0–34.0)
MCHC: 34.1 g/dL (ref 30.0–36.0)
MCV: 94.9 fL (ref 80.0–100.0)
Platelets: 385 10*3/uL (ref 150–400)
RBC: 2.35 MIL/uL — ABNORMAL LOW (ref 4.22–5.81)
RDW: 29.2 % — ABNORMAL HIGH (ref 11.5–15.5)
WBC: 9.5 10*3/uL (ref 4.0–10.5)
nRBC: 400.6 % — ABNORMAL HIGH (ref 0.0–0.2)

## 2024-05-21 LAB — COMPREHENSIVE METABOLIC PANEL WITH GFR
ALT: 69 U/L — ABNORMAL HIGH (ref 0–44)
AST: 125 U/L — ABNORMAL HIGH (ref 15–41)
Albumin: 3.4 g/dL — ABNORMAL LOW (ref 3.5–5.0)
Alkaline Phosphatase: 89 U/L (ref 38–126)
Anion gap: 8 (ref 5–15)
BUN: 15 mg/dL (ref 6–20)
CO2: 22 mmol/L (ref 22–32)
Calcium: 8 mg/dL — ABNORMAL LOW (ref 8.9–10.3)
Chloride: 104 mmol/L (ref 98–111)
Creatinine, Ser: 0.8 mg/dL (ref 0.61–1.24)
GFR, Estimated: >60 mL/min
Glucose, Bld: 112 mg/dL — ABNORMAL HIGH (ref 70–99)
Potassium: 4 mmol/L (ref 3.5–5.1)
Sodium: 134 mmol/L — ABNORMAL LOW (ref 135–145)
Total Bilirubin: 3.9 mg/dL — ABNORMAL HIGH (ref 0.0–1.2)
Total Protein: 7.9 g/dL (ref 6.5–8.1)

## 2024-05-21 NOTE — Progress Notes (Signed)
 Patient ID: Martin [name], male   DOB: Feb 25, 1991, 34 y.o.   MRN: [id number] Subjective: Login Hoey  is a 34 y.o. male with history of sickle cell disease, history of acute chest syndrome, multiple sickle cell crisis, multiple ED visits and hospital admission, history of PE currently on Eliquis , chronic hypoxic respiratory failure on home oxygen , HIV infection and generalized anxiety disorder who was admitted for influenza B infection with acute on chronic hypoxic respiratory failure in the setting of sickle cell pain crisis.   Patient is feeling slightly better today but still in significant amount of pain, up to 7/10 this morning, characterized as throbbing and achy.  His left arm pain has improved.  He is still coughing but less than yesterday.  He denies any fever, chest pain, shortness of breath, nausea, vomiting, diarrhea.  No urinary symptoms.  Objective:  Vital signs in last 24 hours:  Vitals:   05/21/24 0359 05/21/24 0409 05/21/24 0423 05/21/24 0730  BP: 96/67     Pulse: 94     Resp: 17 18 18 14   Temp: 99.8 F (37.7 C)     TempSrc: Oral     SpO2: 95%   94%  Weight:      Height:        Intake/Output from previous day:   Intake/Output Summary (Last 24 hours) at 05/21/2024 1145 Last data filed at 05/21/2024 0730 Gross per 24 hour  Intake 360 ml  Output 1200 ml  Net -840 ml    Physical Exam: General: Chronically ill looking, temporal wasting.  Alert, awake, oriented x3, in no acute distress.  HEENT: [place]/AT [place], EOMI.  Icteric ++, mildly pale. Neck: Trachea midline,  no masses, no thyromegal,y no JVD, no carotid bruit OROPHARYNX:  Moist, No exudate/ erythema/lesions.  Heart: Regular rate and rhythm, without murmurs, rubs, gallops, PMI non-displaced, no heaves or thrills on palpation.  Lungs: Clear to auscultation, no wheezing or rhonchi noted. No increased vocal fremitus resonant to percussion  Abdomen: Soft, nontender, nondistended, positive bowel sounds, no  masses no hepatosplenomegaly noted..  Neuro: No focal neurological deficits noted cranial nerves II through XII grossly intact. DTRs 2+ bilaterally upper and lower extremities. Strength 5 out of 5 in bilateral upper and lower extremities. Musculoskeletal: No warm swelling or erythema around joints, no spinal tenderness noted. Psychiatric: Patient alert and oriented x3, good insight and cognition, good recent to remote recall. Lymph node survey: No cervical axillary or inguinal lymphadenopathy noted.  Lab Results:  Basic Metabolic Panel:    Component Value Date/Time   NA 134 (L) 05/21/2024 0411   NA 145 (H) 05/08/2024 1330   K 4.0 05/21/2024 0411   CL 104 05/21/2024 0411   CO2 22 05/21/2024 0411   BUN 15 05/21/2024 0411   BUN 9 05/08/2024 1330   CREATININE 0.80 05/21/2024 0411   GLUCOSE 112 (H) 05/21/2024 0411   CALCIUM  8.0 (L) 05/21/2024 0411   CBC:    Component Value Date/Time   WBC 9.5 05/21/2024 0411   HGB 7.6 (L) 05/21/2024 0411   HGB 10.6 (L) 05/08/2024 1330   HCT 22.3 (L) 05/21/2024 0411   HCT 31.3 (L) 05/08/2024 1330   PLT 385 05/21/2024 0411   PLT 232 05/08/2024 1330   MCV 94.9 05/21/2024 0411   MCV 101 (H) 05/08/2024 1330   NEUTROABS 8.0 (H) 05/18/2024 2330   NEUTROABS 3.5 05/08/2024 1330   LYMPHSABS 4.8 (H) 05/18/2024 2330   LYMPHSABS 4.8 (H) 05/08/2024 1330   MONOABS 1.3 (H)  05/18/2024 2330   EOSABS 0.3 05/18/2024 2330   EOSABS 0.2 05/08/2024 1330   BASOSABS 0.1 05/18/2024 2330   BASOSABS 0.1 05/08/2024 1330    Recent Results (from the past 240 hours)  Resp panel by RT-PCR (RSV, Flu A&B, Covid) Anterior Nasal Swab     Status: Abnormal   Collection Time: 05/18/24 11:41 PM   Specimen: Anterior Nasal Swab  Result Value Ref Range Status   SARS Coronavirus 2 by RT PCR NEGATIVE NEGATIVE Final    Comment: (NOTE) SARS-CoV-2 target nucleic acids are NOT DETECTED.  The SARS-CoV-2 RNA is generally detectable in upper respiratory specimens during the acute phase  of infection. The lowest concentration of SARS-CoV-2 viral copies this assay can detect is 138 copies/mL. A negative result does not preclude SARS-Cov-2 infection and should not be used as the sole basis for treatment or other patient management decisions. A negative result may occur with  improper specimen collection/handling, submission of specimen other than nasopharyngeal swab, presence of viral mutation(s) within the areas targeted by this assay, and inadequate number of viral copies(<138 copies/mL). A negative result must be combined with clinical observations, patient history, and epidemiological information. The expected result is Negative.  Fact Sheet for Patients:  bloggercourse.com  Fact Sheet for Healthcare Providers:  seriousbroker.it  This test is no t yet approved or cleared by the [place]  FDA and  has been authorized for detection and/or diagnosis of SARS-CoV-2 by FDA under an Emergency Use Authorization (EUA). This EUA will remain  in effect (meaning this test can be used) for the duration of the COVID-19 declaration under Section 564(b)(1) of the Act, 21 U.S.C.section 360bbb-3(b)(1), unless the authorization is terminated  or revoked sooner.       Influenza A by PCR NEGATIVE NEGATIVE Final   Influenza B by PCR POSITIVE (A) NEGATIVE Final    Comment: (NOTE) The Xpert Xpress SARS-CoV-2/FLU/RSV plus assay is intended as an aid in the diagnosis of influenza from Nasopharyngeal swab specimens and should not be used as a sole basis for treatment. Nasal washings and aspirates are unacceptable for Xpert Xpress SARS-CoV-2/FLU/RSV testing.  Fact Sheet for Patients: bloggercourse.com  Fact Sheet for Healthcare Providers: seriousbroker.it  This test is not yet approved or cleared by the [place]  FDA and has been authorized for detection and/or diagnosis of  SARS-CoV-2 by FDA under an Emergency Use Authorization (EUA). This EUA will remain in effect (meaning this test can be used) for the duration of the COVID-19 declaration under Section 564(b)(1) of the Act, 21 U.S.C. section 360bbb-3(b)(1), unless the authorization is terminated or revoked.     Resp Syncytial Virus by PCR NEGATIVE NEGATIVE Final    Comment: (NOTE) Fact Sheet for Patients: bloggercourse.com  Fact Sheet for Healthcare Providers: seriousbroker.it  This test is not yet approved or cleared by the [place]  FDA and has been authorized for detection and/or diagnosis of SARS-CoV-2 by FDA under an Emergency Use Authorization (EUA). This EUA will remain in effect (meaning this test can be used) for the duration of the COVID-19 declaration under Section 564(b)(1) of the Act, 21 U.S.C. section 360bbb-3(b)(1), unless the authorization is terminated or revoked.  Performed at [facility], [address], [place], [place] [address]   Culture, blood (Routine X 2) w Reflex to ID Panel     Status: None (Preliminary result)   Collection Time: 05/19/24  6:05 AM   Specimen: BLOOD  Result Value Ref Range Status   Specimen Description  Final    BLOOD BLOOD LEFT HAND Performed at [facility], [address], [place], [place] [address]    Special Requests   Final    BOTTLES DRAWN AEROBIC AND ANAEROBIC Blood Culture adequate volume Performed at [facility], [address], [place], [place] [address]    Culture   Final    NO GROWTH 2 DAYS Performed at [facility], [address], [place], [place] [address]    Report Status PENDING  Incomplete  Culture, blood (Routine X 2) w Reflex to ID Panel     Status: None (Preliminary result)   Collection Time: 05/19/24  2:41 PM   Specimen: BLOOD  Result Value Ref Range Status   Specimen Description   Final    BLOOD BLOOD  RIGHT HAND Performed at [facility], [address] [place], [place] [address]    Special Requests   Final    BOTTLES DRAWN AEROBIC AND ANAEROBIC Blood Culture results may not be optimal due to an inadequate volume of blood received in culture bottles Performed at [facility], [address] [place], [place] [address]    Culture   Final    NO GROWTH 2 DAYS Performed at [facility], [address], [place], [place] [address]    Report Status PENDING  Incomplete    Studies/Results: No results found.   Medications: Scheduled Meds:  apixaban   5 mg Oral BID   folic acid   1 mg Oral Daily   HYDROmorphone    Intravenous Q4H   hydroxyurea   2,000 mg Oral QHS   oseltamivir   75 mg Oral BID   senna-docusate  1 tablet Oral BID   Continuous Infusions: PRN Meds:.acetaminophen , albuterol , cyclobenzaprine , diphenhydrAMINE , guaiFENesin -dextromethorphan , naloxone  **AND** sodium chloride  flush, ondansetron  (ZOFRAN ) IV, oxyCODONE -acetaminophen  **AND** oxyCODONE , polyethylene glycol  Consultants: None  Procedures: None  Antibiotics: None  Assessment/Plan: Active Problems:   Acute on chronic respiratory failure with hypoxia (HCC)   AKI (acute kidney injury)   Influenza   Chest pain   Sickle cell pain crisis (HCC)  Influenza B infection: Continue Tamiflu  for total of 5 days. Continue symptomatic support. Acute on chronic hypoxemic respiratory failure: Patient is saturating well on supplemental oxygen .  No more shortness of breath or use of accessory muscles.  Will continue supportive care.  Continue incentive spirometry. Hb Sickle Cell Disease with Pain crisis: IV fluid at KVO.  Continue weight based Dilaudid  PCA at the current dose setting of 0.5/10/3, IV Toradol  is contraindicated due to AKI.  Continue oral home pain medications as ordered. Monitor vitals very closely, Re-evaluate pain scale regularly, 2 L of Oxygen  by [place]. Patient encouraged  to ambulate on the hallway today.  Acute kidney injury: Serum creatinine now back to normal.  Continue to avoid all nephrotoxins. Leukocytosis: Resolved.  WBC count now 9.5, down from 17.0 on admission.  Will continue to monitor very closely. Anemia of Chronic Disease: Hemoglobin continues to be stable at baseline. There is no clinical indication for blood transfusion at this time. Will continue to monitor closely and transfuse as appropriate.   Chronic pain Syndrome: Continue oral home pain medications as ordered. History of pulmonary embolism: Continue Eliquis . Chronic thromboembolic pulmonary hypertension: Patient has missed multiple appointments with cardiologist.  On his previous admission, cardiologist advised outpatient right heart cath. Patient encouraged to follow-up once discharged again. HIV infection: Not entirely clear if patient is adherent with his medication.  He is on Triumeq ,  will continue.  Patient counseled extensively about medication adherence and follow-up with specialists.  Code Status: Full Code Family Communication: N/A Disposition Plan: Not yet ready for discharge  Bleu Moisan  If 7PM-7AM, please contact night-coverage.  05/21/2024, 11:45 AM  LOS: 2 days

## 2024-05-22 LAB — CBC
HCT: 21.2 % — ABNORMAL LOW (ref 39.0–52.0)
Hemoglobin: 7.4 g/dL — ABNORMAL LOW (ref 13.0–17.0)
MCH: 32.2 pg (ref 26.0–34.0)
MCHC: 34.9 g/dL (ref 30.0–36.0)
MCV: 92.2 fL (ref 80.0–100.0)
Platelets: 349 10*3/uL (ref 150–400)
RBC: 2.3 MIL/uL — ABNORMAL LOW (ref 4.22–5.81)
RDW: 27.7 % — ABNORMAL HIGH (ref 11.5–15.5)
WBC: 8.3 10*3/uL (ref 4.0–10.5)
nRBC: 344 % — ABNORMAL HIGH (ref 0.0–0.2)

## 2024-05-22 MED ORDER — CARMEX CLASSIC LIP BALM EX OINT
TOPICAL_OINTMENT | CUTANEOUS | Status: DC | PRN
  Filled 2024-05-22: qty 10

## 2024-05-22 NOTE — TOC Initial Note (Signed)
 Transition of Care [facility]) - Initial/Assessment Note    Patient Details  Name: Martin [name] MRN: [id number] Date of Birth: 02-03-1991  Transition of Care [facility]) CM/SW Contact:    Tawni CHRISTELLA Eva, LCSW Phone Number: 05/22/2024, 8:16 AM  Clinical Narrative:                  Received consult for medication assistance. The pt has active [place]  Medicaid and obtains medications from a pharmacy in [place] . Pt high readmit; [facility]. The pt is on oxygen  at baseline and has a walker; no other DME needs were identified. The pt stays with his sister and does not have a permanent address. Family will provide transportation home upon discharge.  Expected Discharge Plan: Home/Self Care Barriers to Discharge: Continued Medical Work up   Patient Goals and CMS Choice            Expected Discharge Plan and Services       Living arrangements for the past 2 months: Apartment                                      Prior Living Arrangements/Services Living arrangements for the past 2 months: Apartment Lives with:: Self, Siblings                   Activities of Daily Living   ADL Screening (condition at time of admission) Independently performs ADLs?: Yes (appropriate for developmental age) Is the patient deaf or have difficulty hearing?: No Does the patient have difficulty seeing, even when wearing glasses/contacts?: No Does the patient have difficulty concentrating, remembering, or making decisions?: No  Permission Sought/Granted                  Emotional Assessment              Admission diagnosis:  Influenza [J11.1] Sickle cell pain crisis (HCC) [D57.00] Patient Active Problem List   Diagnosis Date Noted   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   Congestive heart failure (HCC) 05/08/2024   Screening for lipid disorders 05/08/2024   Pain management contract signed  05/08/2024   Sickle cell anemia with crisis (HCC) 04/20/2024   Peripheral edema 03/25/2024   Pulmonary hypertension, primary (HCC) 03/21/2024   Edema 03/20/2024   Right shoulder pain 03/08/2024   Sickle cell pain crisis (HCC) 02/08/2024   Right flank pain 02/04/2024   Elevated brain natriuretic peptide (BNP) level 02/04/2024   Chest pain 02/04/2024   Acute cystitis with hematuria 02/04/2024   Muscle spasm 02/04/2024   Decrease in appetite 02/04/2024   Acute on chronic hypoxic respiratory failure (HCC) 01/16/2024   Chronic anticoagulation 12/18/2023   Sickle cell crisis (HCC) 10/12/2023   Chronic hypoxic respiratory failure (HCC) 08/04/2023   Iron overload, 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   Influenza A with pneumonia 06/20/2023   Acute hypoxemic respiratory failure (HCC) 06/20/2023   AKI (acute kidney injury) 06/20/2023   Influenza 06/20/2023   Acute on chronic anemia 03/16/2023   History of HIV infection (HCC) 03/16/2023   Hypokalemia 03/16/2023   Anemia of chronic disease 07/13/2022   Hyperbilirubinemia 07/13/2022   Tinea capitis 07/29/2021   Bacteremia due to Enterococcus 08/29/2020   Acute chest syndrome (HCC) 08/26/2020   Hypoxia 07/09/2020   COVID-19 05/13/2020   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/18/2020   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   Abnormal  penile discharge, without blood 02/22/2020   Leukocytosis 01/02/2020   Anxiety 11/27/2019   Proteinuria 11/27/2019   Acute on chronic respiratory failure with hypoxia (HCC) 11/16/2019   Chronic, continuous use of opioids 08/15/2019   Seasonal allergies 08/15/2019   Chest congestion    Chronic pain syndrome 08/04/2019   History of pulmonary embolism 08/04/2019   Acute pulmonary embolism (HCC) 02/10/2019   Acute chest syndrome due to sickle cell crisis (HCC) 02/10/2019   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) 03/09/2018   Bone pain 03/07/2018   Hip pain 03/07/2018   Acute bronchitis due to Streptococcus  02/21/2018   Heart murmur 02/03/2017   Sickle cell disease with crisis (HCC) 02/02/2017   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   Vitamin D  deficiency 08/14/2016   High risk medication use 09/27/2014   Generalized anxiety disorder 05/19/2014   GERD (gastroesophageal reflux disease) 05/19/2014   Marijuana use 11/07/2012   HIV (human immunodeficiency virus infection) (HCC) 05/23/2012   PCP:  [name], [name], [name] Pharmacy:   [facility] [phone] - [place], [place] - [address] AT [facility] [address] [place] [address] Phone: [phone] Fax: [phone]  [facility] [address] [place] [address] Phone: [phone] Fax: [phone]     Social Drivers of Health (SDOH) Social History: SDOH Screenings   Food Insecurity: Food Insecurity Present (05/19/2024)  Housing: High Risk (05/19/2024)  Transportation Needs: Unmet Transportation Needs (05/19/2024)  Utilities: At Risk (05/19/2024)  Depression (PHQ2-9): Low Risk (05/08/2024)  Financial Resource Strain: Low Risk (02/21/2024)  Physical Activity: Sufficiently Active (10/09/2023)   Received from [facility]  Social Connections: Socially Isolated (03/04/2024)  Stress: No Stress Concern Present (10/09/2023)   Received from [facility]  Tobacco Use: Low Risk (05/18/2024)   SDOH Interventions:     Readmission Risk Interventions    05/11/2024    9:46 AM 04/21/2024   12:42 PM 03/07/2024    9:22 AM  Readmission Risk Prevention Plan  Transportation Screening Complete Complete Complete  Medication Review Oceanographer) Complete Complete Complete  PCP or Specialist appointment within 3-5 days of discharge Complete Complete Complete  HRI or Home Care Consult Complete Complete Complete  SW Recovery Care/Counseling Consult Complete Complete Complete  Palliative Care Screening  Not Applicable Not Applicable Not Applicable  Skilled Nursing Facility Not Applicable Not Applicable Not Applicable

## 2024-05-22 NOTE — Plan of Care (Signed)
" °  Problem: Education: Goal: Knowledge of vaso-occlusive preventative measures will improve Outcome: Progressing   Problem: Coping: Goal: Level of anxiety will decrease Outcome: Progressing   Problem: Pain Managment: Goal: General experience of comfort will improve and/or be controlled Outcome: Progressing   Problem: Safety: Goal: Ability to remain free from injury will improve Outcome: Progressing   "

## 2024-05-22 NOTE — Plan of Care (Incomplete)
" °  Problem: Education: Goal: Knowledge of vaso-occlusive preventative measures will improve Outcome: Progressing Goal: Awareness of infection prevention will improve Outcome: Progressing Goal: Awareness of signs and symptoms of anemia will improve Outcome: Progressing   Problem: Self-Care: Goal: Ability to incorporate actions that prevent/reduce pain crisis will improve Outcome: Progressing   Problem: Bowel/Gastric: Goal: Gut motility will be maintained Outcome: Progressing   Problem: Tissue Perfusion: Goal: Complications related to inadequate tissue perfusion will be avoided or minimized Outcome: Progressing   Problem: Respiratory: Goal: Pulmonary complications will be avoided or minimized Outcome: Progressing   "

## 2024-05-23 ENCOUNTER — Ambulatory Visit (HOSPITAL_COMMUNITY): Payer: Medicaid - Out of State | Admitting: Cardiology

## 2024-05-23 LAB — CBC
HCT: 21.1 % — ABNORMAL LOW (ref 39.0–52.0)
Hemoglobin: 7.1 g/dL — ABNORMAL LOW (ref 13.0–17.0)
MCH: 32 pg (ref 26.0–34.0)
MCHC: 33.6 g/dL (ref 30.0–36.0)
MCV: 95 fL (ref 80.0–100.0)
Platelets: 374 10*3/uL (ref 150–400)
RBC: 2.22 MIL/uL — ABNORMAL LOW (ref 4.22–5.81)
RDW: 27.6 % — ABNORMAL HIGH (ref 11.5–15.5)
WBC: 10.6 10*3/uL — ABNORMAL HIGH (ref 4.0–10.5)
nRBC: 239.3 % — ABNORMAL HIGH (ref 0.0–0.2)

## 2024-05-23 MED ORDER — ALUM & MAG HYDROXIDE-SIMETH 200-200-20 MG/5ML PO SUSP
15.0000 mL | Freq: Four times a day (QID) | ORAL | Status: DC | PRN
  Administered 2024-05-23: 15 mL via ORAL
  Filled 2024-05-23: qty 30

## 2024-05-23 NOTE — Progress Notes (Signed)
 Patient ID: Martin [name], male   DOB: July 06, 1990, 34 y.o.   MRN: [id number] Subjective: Martin [name]  is a 34 y.o. male with history of sickle cell disease, history of acute chest syndrome, multiple sickle cell crisis, multiple ED visits and hospital admission, history of PE currently on Eliquis , chronic hypoxic respiratory failure on home oxygen , HIV infection and generalized anxiety disorder who was admitted for influenza B infection with acute on chronic hypoxic respiratory failure in the setting of sickle cell pain crisis.   Patient is endorsing pain of 7/10 this morning,  He is still coughing,  He denies any fever, chest pain, shortness of breath, nausea, vomiting, diarrhea.  No urinary symptoms. Patient is requesting a handheld PEP device to help lossen mucus in his chest.   Objective:  Vital signs in last 24 hours:  Vitals:   05/23/24 1139 05/23/24 1141 05/23/24 1142 05/23/24 1612  BP: 95/66 106/71  116/72  Pulse: 99 87  (!) 103  Resp: (!) 22 20 18 16   Temp: 99.1 F (37.3 C) 99 F (37.2 C)  98 F (36.7 C)  TempSrc: Oral Oral  Oral  SpO2: 91%  92% 95%  Weight:      Height:        Intake/Output from previous day:   Intake/Output Summary (Last 24 hours) at 05/23/2024 1622 Last data filed at 05/23/2024 1141 Gross per 24 hour  Intake --  Output 1675 ml  Net -1675 ml    Physical Exam: General: Chronically ill looking, temporal wasting.  Alert, awake, oriented x3, in no acute distress.  HEENT: [place]/AT PEERL, EOMI.  Icteric ++, mildly pale. Neck: Trachea midline,  no masses, no thyromegal,y no JVD, no carotid bruit OROPHARYNX:  Moist, No exudate/ erythema/lesions.  Heart: Regular rate and rhythm, without murmurs, rubs, gallops, PMI non-displaced, no heaves or thrills on palpation.  Lungs: Cough Abdomen: Soft, nontender, nondistended, positive bowel sounds, no masses no hepatosplenomegaly noted..  Neuro: No focal neurological deficits noted cranial nerves II through  XII grossly intact. DTRs 2+ bilaterally upper and lower extremities. Strength 5 out of 5 in bilateral upper and lower extremities. Musculoskeletal: No warm swelling or erythema around joints, no spinal tenderness noted. Psychiatric: Patient alert and oriented x3, good insight and cognition, good recent to remote recall. Lymph node survey: No cervical axillary or inguinal lymphadenopathy noted.  Lab Results:  Basic Metabolic Panel:    Component Value Date/Time   NA 134 (L) 05/21/2024 0411   NA 145 (H) 05/08/2024 1330   K 4.0 05/21/2024 0411   CL 104 05/21/2024 0411   CO2 22 05/21/2024 0411   BUN 15 05/21/2024 0411   BUN 9 05/08/2024 1330   CREATININE 0.80 05/21/2024 0411   GLUCOSE 112 (H) 05/21/2024 0411   CALCIUM  8.0 (L) 05/21/2024 0411   CBC:    Component Value Date/Time   WBC 10.6 (H) 05/23/2024 0500   HGB 7.1 (L) 05/23/2024 0500   HGB 10.6 (L) 05/08/2024 1330   HCT 21.1 (L) 05/23/2024 0500   HCT 31.3 (L) 05/08/2024 1330   PLT 374 05/23/2024 0500   PLT 232 05/08/2024 1330   MCV 95.0 05/23/2024 0500   MCV 101 (H) 05/08/2024 1330   NEUTROABS 8.0 (H) 05/18/2024 2330   NEUTROABS 3.5 05/08/2024 1330   LYMPHSABS 4.8 (H) 05/18/2024 2330   LYMPHSABS 4.8 (H) 05/08/2024 1330   MONOABS 1.3 (H) 05/18/2024 2330   EOSABS 0.3 05/18/2024 2330   EOSABS 0.2 05/08/2024 1330   BASOSABS 0.1  05/18/2024 2330   BASOSABS 0.1 05/08/2024 1330    Recent Results (from the past 240 hours)  Resp panel by RT-PCR (RSV, Flu A&B, Covid) Anterior Nasal Swab     Status: Abnormal   Collection Time: 05/18/24 11:41 PM   Specimen: Anterior Nasal Swab  Result Value Ref Range Status   SARS Coronavirus 2 by RT PCR NEGATIVE NEGATIVE Final    Comment: (NOTE) SARS-CoV-2 target nucleic acids are NOT DETECTED.  The SARS-CoV-2 RNA is generally detectable in upper respiratory specimens during the acute phase of infection. The lowest concentration of SARS-CoV-2 viral copies this assay can detect is 138  copies/mL. A negative result does not preclude SARS-Cov-2 infection and should not be used as the sole basis for treatment or other patient management decisions. A negative result may occur with  improper specimen collection/handling, submission of specimen other than nasopharyngeal swab, presence of viral mutation(s) within the areas targeted by this assay, and inadequate number of viral copies(<138 copies/mL). A negative result must be combined with clinical observations, patient history, and epidemiological information. The expected result is Negative.  Fact Sheet for Patients:  bloggercourse.com  Fact Sheet for Healthcare Providers:  seriousbroker.it  This test is no t yet approved or cleared by the [place]  FDA and  has been authorized for detection and/or diagnosis of SARS-CoV-2 by FDA under an Emergency Use Authorization (EUA). This EUA will remain  in effect (meaning this test can be used) for the duration of the COVID-19 declaration under Section 564(b)(1) of the Act, 21 U.S.C.section 360bbb-3(b)(1), unless the authorization is terminated  or revoked sooner.       Influenza A by PCR NEGATIVE NEGATIVE Final   Influenza B by PCR POSITIVE (A) NEGATIVE Final    Comment: (NOTE) The Xpert Xpress SARS-CoV-2/FLU/RSV plus assay is intended as an aid in the diagnosis of influenza from Nasopharyngeal swab specimens and should not be used as a sole basis for treatment. Nasal washings and aspirates are unacceptable for Xpert Xpress SARS-CoV-2/FLU/RSV testing.  Fact Sheet for Patients: bloggercourse.com  Fact Sheet for Healthcare Providers: seriousbroker.it  This test is not yet approved or cleared by the [place]  FDA and has been authorized for detection and/or diagnosis of SARS-CoV-2 by FDA under an Emergency Use Authorization (EUA). This EUA will remain in effect  (meaning this test can be used) for the duration of the COVID-19 declaration under Section 564(b)(1) of the Act, 21 U.S.C. section 360bbb-3(b)(1), unless the authorization is terminated or revoked.     Resp Syncytial Virus by PCR NEGATIVE NEGATIVE Final    Comment: (NOTE) Fact Sheet for Patients: bloggercourse.com  Fact Sheet for Healthcare Providers: seriousbroker.it  This test is not yet approved or cleared by the [place]  FDA and has been authorized for detection and/or diagnosis of SARS-CoV-2 by FDA under an Emergency Use Authorization (EUA). This EUA will remain in effect (meaning this test can be used) for the duration of the COVID-19 declaration under Section 564(b)(1) of the Act, 21 U.S.C. section 360bbb-3(b)(1), unless the authorization is terminated or revoked.  Performed at [facility], [address] [place], [place] [address]   Culture, blood (Routine X 2) w Reflex to ID Panel     Status: None (Preliminary result)   Collection Time: 05/19/24  6:05 AM   Specimen: BLOOD  Result Value Ref Range Status   Specimen Description   Final    BLOOD BLOOD LEFT HAND Performed at [facility], [address]  [place], [place] [address]    Special Requests   Final    BOTTLES DRAWN AEROBIC AND ANAEROBIC Blood Culture adequate volume Performed at [facility], [address] [place], [place] [address]    Culture   Final    NO GROWTH 4 DAYS Performed at [facility], [address] [place], [place] [address]    Report Status PENDING  Incomplete  Culture, blood (Routine X 2) w Reflex to ID Panel     Status: None (Preliminary result)   Collection Time: 05/19/24  2:41 PM   Specimen: BLOOD  Result Value Ref Range Status   Specimen Description   Final    BLOOD BLOOD RIGHT HAND Performed at [facility], [address] [place], [place]  [address]    Special Requests   Final    BOTTLES DRAWN AEROBIC AND ANAEROBIC Blood Culture results may not be optimal due to an inadequate volume of blood received in culture bottles Performed at [facility], [address], [place], [place] [address]    Culture   Final    NO GROWTH 4 DAYS Performed at [facility], [address] [place], [place] [address]    Report Status PENDING  Incomplete    Studies/Results: No results found.   Medications: Scheduled Meds:  apixaban   5 mg Oral BID   folic acid   1 mg Oral Daily   HYDROmorphone    Intravenous Q4H   hydroxyurea   2,000 mg Oral QHS   senna-docusate  1 tablet Oral BID   Continuous Infusions: PRN Meds:.acetaminophen , albuterol , alum & mag hydroxide-simeth, cyclobenzaprine , diphenhydrAMINE , guaiFENesin -dextromethorphan , lip balm, naloxone  **AND** sodium chloride  flush, ondansetron  (ZOFRAN ) IV, oxyCODONE -acetaminophen  **AND** oxyCODONE , polyethylene glycol  Consultants: None  Procedures: None  Antibiotics: None  Assessment/Plan: Active Problems:   Acute on chronic respiratory failure with hypoxia (HCC)   AKI (acute kidney injury)   Influenza   Chest pain   Sickle cell pain crisis (HCC)  Influenza B infection: Continue Tamiflu  for total of 5 days. Continue symptomatic support. Acute on chronic hypoxemic respiratory failure: Patient is saturating well on supplemental oxygen . No more shortness of breath or use of accessory muscles. PEP device ordered. Will continue supportive care.  Continue incentive spirometry. Hb Sickle Cell Disease with Pain crisis: IV fluid at KVO.  Continue weight based Dilaudid  PCA at the current dose setting of 0.5/10/3, IV Toradol  is contraindicated due to AKI.  Continue oral home pain medications as ordered. Monitor vitals very closely, Re-evaluate pain scale regularly, 2 L of Oxygen  by [place]. Patient encouraged to ambulate on the hallway today.  Acute kidney injury: Serum  creatinine now back to normal.  Continue to avoid all nephrotoxins. Leukocytosis: slightly elevated, gradually improving from 17.0 on admission.  Will continue to monitor very closely. Anemia of Chronic Disease: Hemoglobin continues to be stable. There is no clinical indication for blood transfusion at this time. Will continue to monitor closely and transfuse as appropriate.   Chronic pain Syndrome: Continue oral home pain medications as ordered. History of pulmonary embolism: Continue Eliquis . Chronic thromboembolic pulmonary hypertension: Patient has missed multiple appointments with cardiologist.  On his previous admission, cardiologist advised outpatient right heart cath. Patient encouraged to follow-up once discharged again. HIV infection: Not entirely clear if patient is adherent with his medication.  He is on Triumeq , will continue.  Patient counseled extensively about medication adherence and follow-up with specialists.  Code Status: Full Code Family Communication: N/A Disposition Plan: Not  yet ready for discharge  [name] Cover NP   If 7PM-7AM, please contact night-coverage.  05/23/2024, 4:22 PM  LOS: 4 days

## 2024-05-23 NOTE — Plan of Care (Signed)
" °  Problem: Education: Goal: Knowledge of vaso-occlusive preventative measures will improve Outcome: Progressing   Problem: Coping: Goal: Level of anxiety will decrease Outcome: Progressing   Problem: Pain Managment: Goal: General experience of comfort will improve and/or be controlled Outcome: Progressing   Problem: Safety: Goal: Ability to remain free from injury will improve Outcome: Progressing   "

## 2024-05-24 LAB — CULTURE, BLOOD (ROUTINE X 2)
Culture: NO GROWTH
Culture: NO GROWTH
Special Requests: ADEQUATE

## 2024-05-24 LAB — CBC
HCT: 18.9 % — ABNORMAL LOW (ref 39.0–52.0)
Hemoglobin: 7 g/dL — ABNORMAL LOW (ref 13.0–17.0)
MCH: 34.3 pg — ABNORMAL HIGH (ref 26.0–34.0)
MCHC: 37 g/dL — ABNORMAL HIGH (ref 30.0–36.0)
MCV: 92.6 fL (ref 80.0–100.0)
Platelets: 459 10*3/uL — ABNORMAL HIGH (ref 150–400)
RBC: 2.04 MIL/uL — ABNORMAL LOW (ref 4.22–5.81)
RDW: 26.9 % — ABNORMAL HIGH (ref 11.5–15.5)
WBC: 9.4 10*3/uL (ref 4.0–10.5)
nRBC: 259.9 % — ABNORMAL HIGH (ref 0.0–0.2)

## 2024-05-24 MED ORDER — HYDROMORPHONE 1 MG/ML IV SOLN
INTRAVENOUS | Status: DC
  Administered 2024-05-24: 1.2 mg via INTRAVENOUS
  Administered 2024-05-25: 2.1 mg via INTRAVENOUS
  Administered 2024-05-25: 4.8 mg via INTRAVENOUS
  Administered 2024-05-25: 3.6 mg via INTRAVENOUS
  Administered 2024-05-25: 30 mg via INTRAVENOUS
  Administered 2024-05-25: 3.3 mg via INTRAVENOUS
  Administered 2024-05-26 (×2): 2.7 mg via INTRAVENOUS
  Administered 2024-05-26: 2.1 mg via INTRAVENOUS
  Filled 2024-05-24: qty 30

## 2024-05-24 NOTE — Plan of Care (Signed)
" °  Problem: Education: Goal: Knowledge of vaso-occlusive preventative measures will improve Outcome: Progressing   Problem: Pain Managment: Goal: General experience of comfort will improve and/or be controlled Outcome: Progressing   Problem: Safety: Goal: Ability to remain free from injury will improve Outcome: Progressing   "

## 2024-05-24 NOTE — Progress Notes (Signed)
 Patient ID: Martin [name], male   DOB: 02-Mar-1991, 34 y.o.   MRN: [id number] Subjective: Martin [name]  is a 34 y.o. male with history of sickle cell disease, history of acute chest syndrome, multiple sickle cell crisis, multiple ED visits and hospital admission, history of PE currently on Eliquis , chronic hypoxic respiratory failure on home oxygen , HIV infection and generalized anxiety disorder who was admitted for influenza B infection with acute on chronic hypoxic respiratory failure in the setting of sickle cell pain crisis.   Patient is endorsing pain of 6/10 this morning,  He is still coughing,  He denies any fever, chest pain, shortness of breath, nausea, vomiting, diarrhea.  No urinary symptoms.   Objective:  Vital signs in last 24 hours:  Vitals:   05/24/24 0413 05/24/24 0504 05/24/24 0915 05/24/24 1142  BP:  99/67    Pulse:  93    Resp: 14 16 16 16   Temp:  98.5 F (36.9 C)    TempSrc:  Oral    SpO2: 94% 92% 92% 92%  Weight:      Height:        Intake/Output from previous day:   Intake/Output Summary (Last 24 hours) at 05/24/2024 1200 Last data filed at 05/24/2024 1149 Gross per 24 hour  Intake 240 ml  Output 1050 ml  Net -810 ml    Physical Exam: General: Chronically ill looking, temporal wasting.  Alert, awake, oriented x3, in no acute distress.  HEENT: [place]/AT PEERL, EOMI.  Icteric ++, mildly pale. Neck: Trachea midline,  no masses, no thyromegal,y no JVD, no carotid bruit OROPHARYNX:  Moist, No exudate/ erythema/lesions.  Heart: Regular rate and rhythm, without murmurs, rubs, gallops, PMI non-displaced, no heaves or thrills on palpation.  Lungs: Cough Abdomen: Soft, nontender, nondistended, positive bowel sounds, no masses no hepatosplenomegaly noted..  Neuro: No focal neurological deficits noted cranial nerves II through XII grossly intact. DTRs 2+ bilaterally upper and lower extremities. Strength 5 out of 5 in bilateral upper and lower  extremities. Musculoskeletal: No warm swelling or erythema around joints, no spinal tenderness noted. Psychiatric: Patient alert and oriented x3, good insight and cognition, good recent to remote recall. Lymph node survey: No cervical axillary or inguinal lymphadenopathy noted.  Lab Results:  Basic Metabolic Panel:    Component Value Date/Time   NA 134 (L) 05/21/2024 0411   NA 145 (H) 05/08/2024 1330   K 4.0 05/21/2024 0411   CL 104 05/21/2024 0411   CO2 22 05/21/2024 0411   BUN 15 05/21/2024 0411   BUN 9 05/08/2024 1330   CREATININE 0.80 05/21/2024 0411   GLUCOSE 112 (H) 05/21/2024 0411   CALCIUM  8.0 (L) 05/21/2024 0411   CBC:    Component Value Date/Time   WBC 9.4 05/24/2024 0500   HGB 7.0 (L) 05/24/2024 0500   HGB 10.6 (L) 05/08/2024 1330   HCT 18.9 (L) 05/24/2024 0500   HCT 31.3 (L) 05/08/2024 1330   PLT 459 (H) 05/24/2024 0500   PLT 232 05/08/2024 1330   MCV 92.6 05/24/2024 0500   MCV 101 (H) 05/08/2024 1330   NEUTROABS 8.0 (H) 05/18/2024 2330   NEUTROABS 3.5 05/08/2024 1330   LYMPHSABS 4.8 (H) 05/18/2024 2330   LYMPHSABS 4.8 (H) 05/08/2024 1330   MONOABS 1.3 (H) 05/18/2024 2330   EOSABS 0.3 05/18/2024 2330   EOSABS 0.2 05/08/2024 1330   BASOSABS 0.1 05/18/2024 2330   BASOSABS 0.1 05/08/2024 1330    Recent Results (from the past 240 hours)  Resp panel  by RT-PCR (RSV, Flu A&B, Covid) Anterior Nasal Swab     Status: Abnormal   Collection Time: 05/18/24 11:41 PM   Specimen: Anterior Nasal Swab  Result Value Ref Range Status   SARS Coronavirus 2 by RT PCR NEGATIVE NEGATIVE Final    Comment: (NOTE) SARS-CoV-2 target nucleic acids are NOT DETECTED.  The SARS-CoV-2 RNA is generally detectable in upper respiratory specimens during the acute phase of infection. The lowest concentration of SARS-CoV-2 viral copies this assay can detect is 138 copies/mL. A negative result does not preclude SARS-Cov-2 infection and should not be used as the sole basis for treatment  or other patient management decisions. A negative result may occur with  improper specimen collection/handling, submission of specimen other than nasopharyngeal swab, presence of viral mutation(s) within the areas targeted by this assay, and inadequate number of viral copies(<138 copies/mL). A negative result must be combined with clinical observations, patient history, and epidemiological information. The expected result is Negative.  Fact Sheet for Patients:  bloggercourse.com  Fact Sheet for Healthcare Providers:  seriousbroker.it  This test is no t yet approved or cleared by the [place]  FDA and  has been authorized for detection and/or diagnosis of SARS-CoV-2 by FDA under an Emergency Use Authorization (EUA). This EUA will remain  in effect (meaning this test can be used) for the duration of the COVID-19 declaration under Section 564(b)(1) of the Act, 21 U.S.C.section 360bbb-3(b)(1), unless the authorization is terminated  or revoked sooner.       Influenza A by PCR NEGATIVE NEGATIVE Final   Influenza B by PCR POSITIVE (A) NEGATIVE Final    Comment: (NOTE) The Xpert Xpress SARS-CoV-2/FLU/RSV plus assay is intended as an aid in the diagnosis of influenza from Nasopharyngeal swab specimens and should not be used as a sole basis for treatment. Nasal washings and aspirates are unacceptable for Xpert Xpress SARS-CoV-2/FLU/RSV testing.  Fact Sheet for Patients: bloggercourse.com  Fact Sheet for Healthcare Providers: seriousbroker.it  This test is not yet approved or cleared by the [place]  FDA and has been authorized for detection and/or diagnosis of SARS-CoV-2 by FDA under an Emergency Use Authorization (EUA). This EUA will remain in effect (meaning this test can be used) for the duration of the COVID-19 declaration under Section 564(b)(1) of the Act, 21  U.S.C. section 360bbb-3(b)(1), unless the authorization is terminated or revoked.     Resp Syncytial Virus by PCR NEGATIVE NEGATIVE Final    Comment: (NOTE) Fact Sheet for Patients: bloggercourse.com  Fact Sheet for Healthcare Providers: seriousbroker.it  This test is not yet approved or cleared by the [place]  FDA and has been authorized for detection and/or diagnosis of SARS-CoV-2 by FDA under an Emergency Use Authorization (EUA). This EUA will remain in effect (meaning this test can be used) for the duration of the COVID-19 declaration under Section 564(b)(1) of the Act, 21 U.S.C. section 360bbb-3(b)(1), unless the authorization is terminated or revoked.  Performed at [facility], [address], [place], [place] [address]   Culture, blood (Routine X 2) w Reflex to ID Panel     Status: None   Collection Time: 05/19/24  6:05 AM   Specimen: BLOOD  Result Value Ref Range Status   Specimen Description   Final    BLOOD BLOOD LEFT HAND Performed at [facility], [address], [place], [place] [address]    Special Requests   Final    BOTTLES DRAWN AEROBIC AND ANAEROBIC Blood Culture adequate volume  Performed at [facility], [address], [place], [place] [address]    Culture   Final    NO GROWTH 5 DAYS Performed at [facility], [address] [place], [place] [address]    Report Status 05/24/2024 FINAL  Final  Culture, blood (Routine X 2) w Reflex to ID Panel     Status: None   Collection Time: 05/19/24  2:41 PM   Specimen: BLOOD  Result Value Ref Range Status   Specimen Description   Final    BLOOD BLOOD RIGHT HAND Performed at [facility], [address] [place], [place] [address]    Special Requests   Final    BOTTLES DRAWN AEROBIC AND ANAEROBIC Blood Culture results may not be optimal due to an inadequate volume of blood  received in culture bottles Performed at [facility], [address], [place], [place] [address]    Culture   Final    NO GROWTH 5 DAYS Performed at [facility], [address] [place], [place] [address]    Report Status 05/24/2024 FINAL  Final    Studies/Results: No results found.   Medications: Scheduled Meds:  apixaban   5 mg Oral BID   folic acid   1 mg Oral Daily   HYDROmorphone    Intravenous Q4H   hydroxyurea   2,000 mg Oral QHS   senna-docusate  1 tablet Oral BID   Continuous Infusions: PRN Meds:.acetaminophen , albuterol , alum & mag hydroxide-simeth, cyclobenzaprine , diphenhydrAMINE , guaiFENesin -dextromethorphan , lip balm, naloxone  **AND** sodium chloride  flush, ondansetron  (ZOFRAN ) IV, oxyCODONE -acetaminophen  **AND** oxyCODONE , polyethylene glycol  Consultants: None  Procedures: None  Antibiotics: None  Assessment/Plan: Active Problems:   Acute on chronic respiratory failure with hypoxia (HCC)   AKI (acute kidney injury)   Influenza   Chest pain   Sickle cell pain crisis (HCC)  Influenza B infection: Continue Tamiflu  for total of 5 days. Continue symptomatic support. Acute on chronic hypoxemic respiratory failure: Patient is saturating well on supplemental oxygen . No more shortness of breath or use of accessory muscles. Continue PEP device. Will continue supportive care.  Continue incentive spirometry. Hb Sickle Cell Disease with Pain crisis: pain is gradually improving, continue IV fluid at KVO.  reduced weight based Dilaudid  PCA to  0.3/10/1.5, IV Toradol  is contraindicated due to AKI.  Continue oral home pain medications as ordered. Monitor vitals very closely, Re-evaluate pain scale regularly, 2 L of Oxygen  by [place]. Patient encouraged to ambulate on the hallway today.  Acute kidney injury: Serum creatinine now back to normal.  Continue to avoid all nephrotoxins. Leukocytosis: slightly elevated, gradually improving from 17.0 on admission  to 10.6 today. Will continue to monitor very closely. Anemia of Chronic Disease: Hemoglobin continues to be stable. There is no clinical indication for blood transfusion at this time. Will continue to monitor closely and transfuse as appropriate.   Chronic pain Syndrome: Continue oral home pain medications as ordered. History of pulmonary embolism: Continue Eliquis . Chronic thromboembolic pulmonary hypertension: Patient has missed multiple appointments with cardiologist.  On his previous admission, cardiologist advised outpatient right heart cath. Patient encouraged to follow-up once discharged again. HIV infection: Not entirely clear if patient is adherent with his medication.  He is on Triumeq , will continue.  Patient counseled extensively about medication adherence and follow-up with specialists.  Code Status: Full Code Family Communication: N/A Disposition Plan: Ready for discharge in 2 days  [name] Cover NP   If 7PM-7AM, please contact night-coverage.  05/24/2024, 12:00  PM  LOS: 5 days

## 2024-05-25 LAB — CBC
HCT: 19.5 % — ABNORMAL LOW (ref 39.0–52.0)
Hemoglobin: 7.3 g/dL — ABNORMAL LOW (ref 13.0–17.0)
MCH: 34 pg (ref 26.0–34.0)
MCHC: 37.4 g/dL — ABNORMAL HIGH (ref 30.0–36.0)
MCV: 90.7 fL (ref 80.0–100.0)
Platelets: 540 10*3/uL — ABNORMAL HIGH (ref 150–400)
RBC: 2.15 MIL/uL — ABNORMAL LOW (ref 4.22–5.81)
RDW: 26.9 % — ABNORMAL HIGH (ref 11.5–15.5)
WBC: 8.4 10*3/uL (ref 4.0–10.5)
nRBC: 275.6 % — ABNORMAL HIGH (ref 0.0–0.2)

## 2024-05-25 MED ORDER — SODIUM CHLORIDE 0.9% FLUSH: 10.0000 mL | INTRAVENOUS | Status: DC | PRN

## 2024-05-25 MED ORDER — CHLORHEXIDINE GLUCONATE CLOTH 2 % EX PADS: 6.0000 | MEDICATED_PAD | Freq: Every day | CUTANEOUS | Status: DC

## 2024-05-25 MED ORDER — SODIUM CHLORIDE 0.9% FLUSH
10.0000 mL | Freq: Two times a day (BID) | INTRAVENOUS | Status: DC
  Administered 2024-05-26: 10 mL

## 2024-05-25 NOTE — Progress Notes (Cosign Needed)
 Patient ID: Martin [name], male   DOB: 28-Nov-1990, 34 y.o.   MRN: [id number] Subjective: Martin [name]  is a 34 y.o. male with history of sickle cell disease, history of acute chest syndrome, multiple sickle cell crisis, multiple ED visits and hospital admission, history of PE currently on Eliquis , chronic hypoxic respiratory failure on home oxygen , HIV infection and generalized anxiety disorder who was admitted for influenza B infection with acute on chronic hypoxic respiratory failure in the setting of sickle cell pain crisis.   Patient is endorsing pain of 6-5/10 this morning,  He is still coughing,  He denies any fever, chest pain, shortness of breath, nausea, vomiting, diarrhea.  No urinary symptoms.   Objective:  Vital signs in last 24 hours:  Vitals:   05/26/24 0408 05/26/24 0459 05/26/24 0801 05/26/24 0906  BP:  93/63 102/64   Pulse:  78    Resp: 16 18  12   Temp:  98.2 F (36.8 C) 98 F (36.7 C)   TempSrc:  Oral Oral   SpO2: 96% 94% 95%   Weight:      Height:        Intake/Output from previous day:   Intake/Output Summary (Last 24 hours) at 05/26/2024 1100 Last data filed at 05/26/2024 0900 Gross per 24 hour  Intake 120 ml  Output 1200 ml  Net -1080 ml    Physical Exam: General: Chronically ill looking, temporal wasting.  Alert, awake, oriented x3, in no acute distress.  HEENT: [place]/AT PEERL, EOMI.  Icteric ++, mildly pale. Neck: Trachea midline,  no masses, no thyromegal,y no JVD, no carotid bruit OROPHARYNX:  Moist, No exudate/ erythema/lesions.  Heart: Regular rate and rhythm, without murmurs, rubs, gallops, PMI non-displaced, no heaves or thrills on palpation.  Lungs: Cough Abdomen: Soft, nontender, nondistended, positive bowel sounds, no masses no hepatosplenomegaly noted..  Neuro: No focal neurological deficits noted cranial nerves II through XII grossly intact. DTRs 2+ bilaterally upper and lower extremities. Strength 5 out of 5 in bilateral upper and  lower extremities. Musculoskeletal: No warm swelling or erythema around joints, no spinal tenderness noted. Psychiatric: Patient alert and oriented x3, good insight and cognition, good recent to remote recall. Lymph node survey: No cervical axillary or inguinal lymphadenopathy noted.  Lab Results:  Basic Metabolic Panel:    Component Value Date/Time   NA 134 (L) 05/21/2024 0411   NA 145 (H) 05/08/2024 1330   K 4.0 05/21/2024 0411   CL 104 05/21/2024 0411   CO2 22 05/21/2024 0411   BUN 15 05/21/2024 0411   BUN 9 05/08/2024 1330   CREATININE 0.80 05/21/2024 0411   GLUCOSE 112 (H) 05/21/2024 0411   CALCIUM  8.0 (L) 05/21/2024 0411   CBC:    Component Value Date/Time   WBC 7.3 05/26/2024 0529   HGB 7.4 (L) 05/26/2024 0529   HGB 10.6 (L) 05/08/2024 1330   HCT 20.3 (L) 05/26/2024 0529   HCT 31.3 (L) 05/08/2024 1330   PLT 595 (H) 05/26/2024 0529   PLT 232 05/08/2024 1330   MCV 92.3 05/26/2024 0529   MCV 101 (H) 05/08/2024 1330   NEUTROABS 8.0 (H) 05/18/2024 2330   NEUTROABS 3.5 05/08/2024 1330   LYMPHSABS 4.8 (H) 05/18/2024 2330   LYMPHSABS 4.8 (H) 05/08/2024 1330   MONOABS 1.3 (H) 05/18/2024 2330   EOSABS 0.3 05/18/2024 2330   EOSABS 0.2 05/08/2024 1330   BASOSABS 0.1 05/18/2024 2330   BASOSABS 0.1 05/08/2024 1330    Recent Results (from the past 240 hours)  Resp panel by RT-PCR (RSV, Flu A&B, Covid) Anterior Nasal Swab     Status: Abnormal   Collection Time: 05/18/24 11:41 PM   Specimen: Anterior Nasal Swab  Result Value Ref Range Status   SARS Coronavirus 2 by RT PCR NEGATIVE NEGATIVE Final    Comment: (NOTE) SARS-CoV-2 target nucleic acids are NOT DETECTED.  The SARS-CoV-2 RNA is generally detectable in upper respiratory specimens during the acute phase of infection. The lowest concentration of SARS-CoV-2 viral copies this assay can detect is 138 copies/mL. A negative result does not preclude SARS-Cov-2 infection and should not be used as the sole basis for  treatment or other patient management decisions. A negative result may occur with  improper specimen collection/handling, submission of specimen other than nasopharyngeal swab, presence of viral mutation(s) within the areas targeted by this assay, and inadequate number of viral copies(<138 copies/mL). A negative result must be combined with clinical observations, patient history, and epidemiological information. The expected result is Negative.  Fact Sheet for Patients:  bloggercourse.com  Fact Sheet for Healthcare Providers:  seriousbroker.it  This test is no t yet approved or cleared by the [place]  FDA and  has been authorized for detection and/or diagnosis of SARS-CoV-2 by FDA under an Emergency Use Authorization (EUA). This EUA will remain  in effect (meaning this test can be used) for the duration of the COVID-19 declaration under Section 564(b)(1) of the Act, 21 U.S.C.section 360bbb-3(b)(1), unless the authorization is terminated  or revoked sooner.       Influenza A by PCR NEGATIVE NEGATIVE Final   Influenza B by PCR POSITIVE (A) NEGATIVE Final    Comment: (NOTE) The Xpert Xpress SARS-CoV-2/FLU/RSV plus assay is intended as an aid in the diagnosis of influenza from Nasopharyngeal swab specimens and should not be used as a sole basis for treatment. Nasal washings and aspirates are unacceptable for Xpert Xpress SARS-CoV-2/FLU/RSV testing.  Fact Sheet for Patients: bloggercourse.com  Fact Sheet for Healthcare Providers: seriousbroker.it  This test is not yet approved or cleared by the [place]  FDA and has been authorized for detection and/or diagnosis of SARS-CoV-2 by FDA under an Emergency Use Authorization (EUA). This EUA will remain in effect (meaning this test can be used) for the duration of the COVID-19 declaration under Section 564(b)(1) of the Act, 21  U.S.C. section 360bbb-3(b)(1), unless the authorization is terminated or revoked.     Resp Syncytial Virus by PCR NEGATIVE NEGATIVE Final    Comment: (NOTE) Fact Sheet for Patients: bloggercourse.com  Fact Sheet for Healthcare Providers: seriousbroker.it  This test is not yet approved or cleared by the [place]  FDA and has been authorized for detection and/or diagnosis of SARS-CoV-2 by FDA under an Emergency Use Authorization (EUA). This EUA will remain in effect (meaning this test can be used) for the duration of the COVID-19 declaration under Section 564(b)(1) of the Act, 21 U.S.C. section 360bbb-3(b)(1), unless the authorization is terminated or revoked.  Performed at [facility], [address], [place], [place] [address]   Culture, blood (Routine X 2) w Reflex to ID Panel     Status: None   Collection Time: 05/19/24  6:05 AM   Specimen: BLOOD  Result Value Ref Range Status   Specimen Description   Final    BLOOD BLOOD LEFT HAND Performed at [facility], [address], [place], [place] [address]    Special Requests   Final    BOTTLES DRAWN AEROBIC AND ANAEROBIC Blood Culture  adequate volume Performed at [facility], [address] [place], [place] [address]    Culture   Final    NO GROWTH 5 DAYS Performed at [facility], [address] [place], [place] [address]    Report Status 05/24/2024 FINAL  Final  Culture, blood (Routine X 2) w Reflex to ID Panel     Status: None   Collection Time: 05/19/24  2:41 PM   Specimen: BLOOD  Result Value Ref Range Status   Specimen Description   Final    BLOOD BLOOD RIGHT HAND Performed at [name], [address], [place], [place] [address]    Special Requests   Final    BOTTLES DRAWN AEROBIC AND ANAEROBIC Blood Culture results may not be optimal due to an inadequate volume of blood  received in culture bottles Performed at [facility], [address], [place], [place] [address]    Culture   Final    NO GROWTH 5 DAYS Performed at [facility], [address] [place], [place] [address]    Report Status 05/24/2024 FINAL  Final    Studies/Results: No results found.   Medications: Scheduled Meds:  apixaban   5 mg Oral BID   folic acid   1 mg Oral Daily   HYDROmorphone    Intravenous Q4H   hydroxyurea   2,000 mg Oral QHS   senna-docusate  1 tablet Oral BID   sodium chloride  flush  10-40 mL Intracatheter Q12H   sodium chloride  flush  10-40 mL Intracatheter Q12H   Continuous Infusions: PRN Meds:.acetaminophen , albuterol , alum & mag hydroxide-simeth, cyclobenzaprine , diphenhydrAMINE , guaiFENesin -dextromethorphan , lip balm, naloxone  **AND** sodium chloride  flush, ondansetron  (ZOFRAN ) IV, oxyCODONE -acetaminophen  **AND** oxyCODONE , polyethylene glycol, sodium chloride  flush, sodium chloride  flush  Consultants: None  Procedures: None  Antibiotics: None  Assessment/Plan: Active Problems:   Acute on chronic respiratory failure with hypoxia (HCC)   AKI (acute kidney injury)   Influenza   Chest pain   Sickle cell pain crisis (HCC)  Influenza B infection: Continue Tamiflu  for total of 5 days (Complete) Continue symptomatic support. Acute on chronic hypoxemic respiratory failure: Patient is saturating well on supplemental oxygen . No more shortness of breath or use of accessory muscles. Continue PEP device. Will continue supportive care.  Continue incentive spirometry. Hb Sickle Cell Disease with Pain crisis: pain continues to improve, continue IV fluid at KVO.  reduced weight based Dilaudid  PCA to  0.3/10/1.5, IV Toradol  is contraindicated due to AKI.  Continue oral home pain medications as ordered. Monitor vitals very closely, Re-evaluate pain scale regularly, 2 L of Oxygen  by [place]. Patient encouraged to ambulate on the hallway today.  Acute  kidney injury: Serum creatinine now back to normal.  Continue to avoid all nephrotoxins. Leukocytosis: Resolved Anemia of Chronic Disease: Hemoglobin continues to be stable. There is no clinical indication for blood transfusion at this time. Will continue to monitor closely and transfuse as appropriate.   Chronic pain Syndrome: Continue oral home pain medications as ordered. History of pulmonary embolism: Continue Eliquis . Chronic thromboembolic pulmonary hypertension: Patient has missed multiple appointments with cardiologist.  On his previous admission, cardiologist advised outpatient right heart cath. Patient encouraged to follow-up once discharged again. HIV infection: Not entirely clear if patient is adherent with his medication.  He is on Triumeq , will continue.  Patient counseled extensively about medication adherence and follow-up with specialists.  Code Status: Full Code Family Communication: N/A Disposition Plan: Ready for discharge in 2 days  Raziyah Vanvleck  CHRISTELLA Cover NP   If 7PM-7AM, please contact night-coverage.  05/26/2023, 11:00 AM  LOS: 7 days

## 2024-05-26 LAB — CBC
HCT: 20.3 % — ABNORMAL LOW (ref 39.0–52.0)
Hemoglobin: 7.4 g/dL — ABNORMAL LOW (ref 13.0–17.0)
MCH: 33.6 pg (ref 26.0–34.0)
MCHC: 36.5 g/dL — ABNORMAL HIGH (ref 30.0–36.0)
MCV: 92.3 fL (ref 80.0–100.0)
Platelets: 595 10*3/uL — ABNORMAL HIGH (ref 150–400)
RBC: 2.2 MIL/uL — ABNORMAL LOW (ref 4.22–5.81)
RDW: 27 % — ABNORMAL HIGH (ref 11.5–15.5)
WBC: 7.3 10*3/uL (ref 4.0–10.5)
nRBC: 19.7 % — ABNORMAL HIGH (ref 0.0–0.2)

## 2024-05-26 MED ORDER — SODIUM CHLORIDE 0.9% FLUSH: 10.0000 mL | Freq: Two times a day (BID) | INTRAVENOUS | Status: DC

## 2024-05-26 MED ORDER — HEPARIN SOD (PORK) LOCK FLUSH 100 UNIT/ML IV SOLN
500.0000 [IU] | INTRAVENOUS | Status: AC | PRN
  Administered 2024-05-26: 500 [IU]

## 2024-05-26 MED ORDER — SODIUM CHLORIDE 0.9% FLUSH: 10.0000 mL | INTRAVENOUS | Status: DC | PRN

## 2024-05-26 NOTE — Discharge Summary (Signed)
 Physician Discharge Summary  Martin [name] FMW:[id number] DOB: 09-08-1990 DOA: 05/18/2024  PCP: [name], [name], [name]  Admit date: 05/18/2024  Discharge date: 05/26/2024  Discharge Diagnoses:  Active Problems:   Acute on chronic respiratory failure with hypoxia (HCC)   AKI (acute kidney injury)   Influenza   Chest pain   Sickle cell pain crisis [facility])   Discharge Condition: Stable  Disposition:   Follow-up Information     [name], [name], [name]. Call in 1 week(s).   Specialty: Nurse Practitioner Contact information: [address] [place] [address] [phone]                Pt is discharged home in good condition and is to follow up with [name], [name], [name] this week to have labs evaluated. Martin [name] is instructed to increase activity slowly and balance with rest for the next few days, and use prescribed medication to complete treatment of pain  Diet: Regular Wt Readings from Last 3 Encounters:  05/19/24 70.1 kg  05/12/24 70.1 kg  05/08/24 69.4 kg    History of present illness:  Martin [name] is a 34 y.o. male with medical history significant for sickle cell disease, history of acute chest syndrome, multiple sickle cell crisis, chronic pain syndrome, generalized anxiety disorder, GERD, HIV, history of pulmonary embolism on Eliquis , chronic hypoxic respiratory failure on 2 L oxygen  nasal cannula at baseline, who presents to the ER with cough,  chills, fever, generalized body pain similar to prior sickle cell pain crisis. Symptoms ongoing for 3 days prior to ED visits. No chest pain.  Has been feeling progressively short of breath.     ED Course: SpO2 80% on room air on arrival and improved to 94% with 6 L [place].  Afebrile.  Labs notable for WBC count 14.7, hemoglobin 8.1, platelet count 452k, sodium 133, bicarb 20, anion gap 11, glucose 110, BUN 55, creatinine 1.62, calcium  8.2, albumin  3.5, total protein 8.5,  AST 224, ALT 120, alk phos 119, T. bili 7.1 (chronically elevated and close to baseline), influenza B PCR positive.  Chest x-ray showing mild pulmonary edema which appears improved since prior study.  EKG showing sinus rhythm, ST depression/T wave inversions anterolaterally similar to previous EKGs but ST depression/T wave inversions in inferior leads appear new.  No troponin checked.  Patient was given Tylenol , Tamiflu , IV fluids, and multiple doses of IV Dilaudid . Symptoms did not improve, patient was admitted for sickle cell pain crisis management.   Hospital Course:  Patient was admitted for sickle cell pain crisis and managed appropriately with IVF, IV Dilaudid  via PCA and IV Toradol , HGB remained stable all through admission with  no clinical need for transfusion. Patient is up and ambulating without assistance, pain is back to baseline. He is not in any distress.  Patient was therefore discharged home today in a hemodynamically stable condition.   Martin [name] will follow-up with PCP within 1 week of this discharge. Martin [name] was counseled extensively about nonpharmacologic means of pain management, patient verbalized understanding and was appreciative of  the care received during this admission.   We discussed the need for good hydration, monitoring of hydration status, avoidance of heat, cold, stress, and infection triggers. We discussed the need to be adherent with taking his home medications. Patient was reminded of the need to seek medical attention immediately if any symptom of bleeding, anemia, or infection occurs.  Discharge Exam: Vitals:   05/26/24 9198 05/26/24 9093  BP: 102/64   Pulse:    Resp:  12  Temp: 98 F (36.7 C)   SpO2: 95%    Vitals:   05/26/24 0408 05/26/24 0459 05/26/24 0801 05/26/24 0906  BP:  93/63 102/64   Pulse:  78    Resp: 16 18  12   Temp:  98.2 F (36.8 C) 98 F (36.7 C)   TempSrc:  Oral Oral   SpO2: 96% 94% 95%   Weight:      Height:        General  appearance : Awake, alert, not in any distress. Speech Clear. Not toxic looking HEENT: Atraumatic and Normocephalic, pupils equally reactive to light and accomodation Neck: Supple, no JVD. No cervical lymphadenopathy.  Chest: Good air entry bilaterally, no added sounds  CVS: S1 S2 regular, no murmurs.  Abdomen: Bowel sounds present, Non tender and not distended with no gaurding, rigidity or rebound. Extremities: B/L Lower Ext shows no edema, both legs are warm to touch Neurology: Awake alert, and oriented X 3, CN II-XII intact, Non focal Skin: No Rash  Discharge Instructions  Discharge Instructions     Call MD for:  persistant nausea and vomiting   Complete by: As directed    Call MD for:  severe uncontrolled pain   Complete by: As directed    Call MD for:  temperature >100.4   Complete by: As directed    Increase activity slowly   Complete by: As directed       Allergies as of 05/26/2024       Reactions   Ketorolac  Tromethamine  Swelling, Other (See Comments)   Patient reports facial edema and left arm edema after administration.   Tape Other (See Comments), Rash, Dermatitis   PLEASE DO NOT USE THE CLEAR, THICK, PLASTIC TAPE- only paper tape is tolerated Paper Tape = ok   Ketorolac  Other (See Comments), Swelling   Patient reports facial edema and left arm edema after administration.   Wound Dressing Adhesive Rash   Bactoshield Chg [chlorhexidine  Gluconate] Other (See Comments)   Per patient it makes me breakout        Medication List     TAKE these medications    albuterol  108 (90 Base) MCG/ACT inhaler Commonly known as: VENTOLIN  HFA Inhale 2 puffs into the lungs every 6 (six) hours as needed for wheezing or shortness of breath.   cyclobenzaprine  5 MG tablet Commonly known as: FLEXERIL  Take 1 tablet (5 mg total) by mouth 2 (two) times daily as needed for muscle spasms. What changed: when to take this   Deferasirox  360 MG Tabs Commonly known as: Jadenu  Take  2 tablets (720 mg total) by mouth daily.   diphenhydrAMINE  25 MG tablet Commonly known as: BENADRYL  Take 1 tablet (25 mg total) by mouth every 4 (four) hours as needed for itching. What changed:  when to take this reasons to take this   Eliquis  5 MG Tabs tablet Generic drug: apixaban  Take 5 mg by mouth 2 (two) times daily.   folic acid  1 MG tablet Commonly known as: FOLVITE  Take 1 tablet (1 mg total) by mouth daily.   gabapentin  300 MG capsule Commonly known as: NEURONTIN  Take 1 capsule (300 mg total) by mouth at bedtime as needed (nerve pain). What changed: when to take this   guaiFENesin -dextromethorphan  100-10 MG/5ML syrup Commonly known as: ROBITUSSIN DM Take 10 mLs by mouth every 4 (four) hours as needed for cough.   Robafen DM 20-200 MG/20ML Liqd Generic drug: Dextromethorphan -guaiFENesin   Take 10 mLs by mouth every 4 (four) hours as needed (chest congestion).   hydroxyurea  500 MG capsule Commonly known as: HYDREA  Take 4 capsules (2,000 mg total) by mouth at bedtime.   Narcan  4 MG/0.1ML Liqd nasal spray kit Generic drug: naloxone  Place 1 spray into the nose as needed (respiratory distress / opioid overdose).   oxyCODONE -acetaminophen  10-325 MG tablet Commonly known as: PERCOCET Take 1 tablet by mouth every 4 (four) hours as needed for pain.   pantoprazole  40 MG tablet Commonly known as: PROTONIX  Take 1 tablet (40 mg total) by mouth daily as needed (acid reflux).   senna-docusate 8.6-50 MG tablet Commonly known as: Senokot-S Take 2 tablets by mouth at bedtime.   Triumeq  600-50-300 MG tablet Generic drug: abacavir -dolutegravir -lamiVUDine  Take 1 tablet by mouth daily.   Vitamin D  (Ergocalciferol ) 1.25 MG (50000 UNIT) Caps capsule Commonly known as: DRISDOL  Take 1 capsule (50,000 Units total) by mouth every 7 (seven) days.        The results of significant diagnostics from this hospitalization (including imaging, microbiology, ancillary and laboratory)  are listed below for reference.    Significant Diagnostic Studies: DG Chest Portable 1 View Result Date: 05/18/2024 EXAM: 1 VIEW XRAY OF THE CHEST 05/18/2024 11:57:00 PM COMPARISON: 05/11/2024 CLINICAL HISTORY: Shortness of breath/PE. FINDINGS: LINES, TUBES AND DEVICES: Stable right chest wall port with tip overlying the superior cavoatrial junction. LUNGS AND PLEURA: Mild pulmonary edema. Edema is improved since prior study. Chronic bilateral interstitial prominence. No pleural effusion. No pneumothorax. HEART AND MEDIASTINUM: Cardiomegaly, unchanged. BONES AND SOFT TISSUES: No acute osseous abnormality. IMPRESSION: 1. Mild pulmonary edema, improved since prior study. 2. Chronic bilateral interstitial prominence. 3. Cardiomegaly, unchanged. Electronically signed by: Elsie Gravely MD 05/18/2024 11:59 PM EST RP Workstation: [id number]   ECHOCARDIOGRAM LIMITED Result Date: 05/11/2024    ECHOCARDIOGRAM LIMITED REPORT   Patient Name:   BERNELL SIGAL Date of Exam: 05/11/2024 Medical Rec #:  [id number]                Height:       77.0 in Accession #:    [id number]               Weight:       153.4 lb Date of Birth:  [birth year]                BSA:          1.999 m Patient Age:    33 years                 BP:           112/78 mmHg Patient Gender: M                        HR:           82 bpm. Exam Location:  Inpatient Procedure: Limited Echo, Cardiac Doppler and Color Doppler (Both Spectral and            Color Flow Doppler were utilized during procedure). Indications:    CHF  History:        Patient has prior history of Echocardiogram examinations, most                 recent 02/09/2024. Signs/Symptoms:Chest Pain and Murmur.  Sonographer:    Philomena Daring Referring Phys: [id number] CAROLE N HALL IMPRESSIONS  1. Limited Echocardiogram.  2. Left ventricular ejection fraction, by estimation, is 60 to 65%.  The left ventricle has normal function. Left ventricular diastolic parameters were normal.  3. Right  ventricular systolic function is normal. The right ventricular size is grossly enlarged. There is moderately elevated pulmonary artery systolic pressure. The estimated right ventricular systolic pressure is 50.0 mmHg.  4. Right atrial size was grossly dilated.  5. Mild to moderate mitral valve regurgitation.  6. The aortic valve is tricuspid. Aortic valve regurgitation is not visualized. No aortic stenosis is present.  7. The inferior vena cava is dilated in size with <50% respiratory variability, suggesting right atrial pressure of 15 mmHg. Comparison(s): A prior study was performed on 02/09/2024. LVEF 65-70%, no RWMAs, normal diastolic function, RV severely enlarged, RV size mildly reduced, RVSP , RAE severe, moderate MR, moderate to severe TR, estimated RAP . FINDINGS  Left Ventricle: Left ventricular ejection fraction, by estimation, is 60 to 65%. The left ventricle has normal function. The left ventricular internal cavity size was normal in size. There is no left ventricular hypertrophy. Left ventricular diastolic parameters were normal. Right Ventricle: The right ventricular size is grossly enlarged. Right ventricular systolic function is normal. There is moderately elevated pulmonary artery systolic pressure. The tricuspid regurgitant velocity is 2.96 m/s, and with an assumed right atrial pressure of 15 mmHg, the estimated right ventricular systolic pressure is 50.0 mmHg. Right Atrium: Right atrial size was grossly dilated. Mitral Valve: Mild to moderate mitral valve regurgitation, with posteriorly-directed jet. Tricuspid Valve: The tricuspid valve is normal in structure. Tricuspid valve regurgitation is mild . No evidence of tricuspid stenosis. Aortic Valve: The aortic valve is tricuspid. Aortic valve regurgitation is not visualized. No aortic stenosis is present. Pulmonic Valve: The pulmonic valve was normal in structure. Pulmonic valve regurgitation is trivial. No evidence of pulmonic stenosis.  Aorta: The aortic root and ascending aorta are structurally normal, with no evidence of dilitation. Venous: The inferior vena cava is dilated in size with less than 50% respiratory variability, suggesting right atrial pressure of 15 mmHg. IAS/Shunts: There is left bowing of the interatrial septum, suggestive of elevated right atrial pressure. Additional Comments: Spectral Doppler performed. Color Doppler performed.  LEFT VENTRICLE PLAX 2D LVIDd:         5.10 cm   Diastology LVIDs:         3.30 cm   LV e' medial:    9.03 cm/s LV PW:         1.10 cm   LV E/e' medial:  9.0 LV IVS:        1.10 cm   LV e' lateral:   13.40 cm/s LVOT diam:     2.20 cm   LV E/e' lateral: 6.1 LV SV:         74 LV SV Index:   37 LVOT Area:     3.80 cm  RIGHT VENTRICLE             IVC RV S prime:     14.60 cm/s  IVC diam: 2.60 cm TAPSE (M-mode): 1.7 cm LEFT ATRIUM         Index LA diam:    4.20 cm 2.10 cm/m  AORTIC VALVE LVOT Vmax:   108.00 cm/s LVOT Vmean:  70.300 cm/s LVOT VTI:    0.194 m  AORTA Ao Root diam: 3.10 cm Ao Asc diam:  3.20 cm MITRAL VALVE               TRICUSPID VALVE MV Area (PHT): 5.13 cm    TR Peak grad:  35.0 mmHg MV Decel Time: 148 msec    TR Vmax:        296.00 cm/s MV E velocity: 81.40 cm/s MV A velocity: 54.80 cm/s  SHUNTS MV E/A ratio:  1.49        Systemic VTI:  0.19 m                            Systemic Diam: 2.20 cm Sunit Tolia Electronically signed by Madonna Large Signature Date/Time: 05/11/2024/1:47:00 PM    Final    NM Pulmonary Perfusion Result Date: 05/11/2024 EXAM: NM Lung Perfusion Scan. 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 TECHNIQUE: Radiolabeled MAA was administered intravenously and planar images of the lungs were obtained in multiple projections. RADIOPHARMACEUTICAL: 4.4 millicurie (technetium albumin  aggregated (MAA) injection solution 4.4 millicurie  TECHNETIUM TO 70M ALBUMIN  AGGREGATED) COMPARISON: Comparison exam 05/09/2024. FINDINGS: PERFUSION: There are multiple small peripheral perfusion defects within the left and right lung. Defects involve the upper lobes and lower lobes. The perfusion abnormalities are greater than the pulmonary parenchymal changes on comparison radiograph. Multiple bilateral lung perfusion defects which for the most part are unmatched, findings consistent with chronic pulmonary thromboembolism. IMPRESSION: 1. Multiple bilateral small unmated peripheral perfusion defects. 2. 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 Electronically signed by: Norleen Boxer MD 05/11/2024 01:42 PM EST RP Workstation: [id number]   DG Chest 2 View Result Date: 05/11/2024 EXAM: 2 VIEW(S) XRAY OF THE CHEST 05/11/2024 12:12:19 AM COMPARISON: Portable chest recently 04/22/2024. CLINICAL HISTORY: Sickle cell crisis with shortness of breath. FINDINGS: LINES, TUBES AND DEVICES: There is a right IJ (internal jugular) port catheter again terminating at the superior cavoatrial junction. Overlying telemetry leads. LUNGS AND PLEURA: There is increased vascular engorgement, interval onset of mild interstitial edema with a basal gradient. Trace pleural effusions are beginning to form. Patchy perihilar hazy opacities are present on both sides and could be ground-glass edema or pneumonitis. Findings are superimposed on chronic lung changes. Clinical correlation and radiographic follow-up recommended. No pneumothorax. HEART AND MEDIASTINUM: There is mild cardiomegaly with a prominent left main pulmonary artery segment and stable mediastinum. BONES AND SOFT TISSUES: No acute osseous abnormality. IMPRESSION: 1. Mild interstitial edema with a basal gradient and trace pleural effusions. Findings consistent with chf or fluid overload. Vascular engorgement also increased. 2. Patchy perihilar hazy opacities bilaterally, which may represent  ground-glass edema or pneumonitis, superimposed on chronic lung changes; clinical correlation and radiographic follow-up recommended. Electronically signed by: Francis Quam MD 05/11/2024 12:19 AM EST RP Workstation: [id number]    Microbiology: Recent Results (from the past 240 hours)  Resp panel by RT-PCR (RSV, Flu A&B, Covid) Anterior Nasal Swab     Status: Abnormal   Collection Time: 05/18/24 11:41 PM   Specimen: Anterior Nasal Swab  Result Value Ref Range Status   SARS Coronavirus 2 by RT PCR NEGATIVE NEGATIVE Final    Comment: (NOTE) SARS-CoV-2 target nucleic acids are NOT DETECTED.  The SARS-CoV-2 RNA is generally detectable in upper respiratory specimens during the acute phase of infection. The lowest concentration of SARS-CoV-2 viral copies this assay can detect is 138 copies/mL. A negative result does not preclude SARS-Cov-2 infection and should not be used as the sole basis for treatment or other patient management decisions. A negative result may occur with  improper specimen collection/handling, submission of specimen other than  nasopharyngeal swab, presence of viral mutation(s) within the areas targeted by this assay, and inadequate number of viral copies(<138 copies/mL). A negative result must be combined with clinical observations, patient history, and epidemiological information. The expected result is Negative.  Fact Sheet for Patients:  bloggercourse.com  Fact Sheet for Healthcare Providers:  seriousbroker.it  This test is no t yet approved or cleared by the [place]  FDA and  has been authorized for detection and/or diagnosis of SARS-CoV-2 by FDA under an Emergency Use Authorization (EUA). This EUA will remain  in effect (meaning this test can be used) for the duration of the COVID-19 declaration under Section 564(b)(1) of the Act, 21 U.S.C.section 360bbb-3(b)(1), unless the authorization is terminated  or  revoked sooner.       Influenza A by PCR NEGATIVE NEGATIVE Final   Influenza B by PCR POSITIVE (A) NEGATIVE Final    Comment: (NOTE) The Xpert Xpress SARS-CoV-2/FLU/RSV plus assay is intended as an aid in the diagnosis of influenza from Nasopharyngeal swab specimens and should not be used as a sole basis for treatment. Nasal washings and aspirates are unacceptable for Xpert Xpress SARS-CoV-2/FLU/RSV testing.  Fact Sheet for Patients: bloggercourse.com  Fact Sheet for Healthcare Providers: seriousbroker.it  This test is not yet approved or cleared by the [place]  FDA and has been authorized for detection and/or diagnosis of SARS-CoV-2 by FDA under an Emergency Use Authorization (EUA). This EUA will remain in effect (meaning this test can be used) for the duration of the COVID-19 declaration under Section 564(b)(1) of the Act, 21 U.S.C. section 360bbb-3(b)(1), unless the authorization is terminated or revoked.     Resp Syncytial Virus by PCR NEGATIVE NEGATIVE Final    Comment: (NOTE) Fact Sheet for Patients: bloggercourse.com  Fact Sheet for Healthcare Providers: seriousbroker.it  This test is not yet approved or cleared by the [place]  FDA and has been authorized for detection and/or diagnosis of SARS-CoV-2 by FDA under an Emergency Use Authorization (EUA). This EUA will remain in effect (meaning this test can be used) for the duration of the COVID-19 declaration under Section 564(b)(1) of the Act, 21 U.S.C. section 360bbb-3(b)(1), unless the authorization is terminated or revoked.  Performed at [facility], [address] [place], [place] [address]   Culture, blood (Routine X 2) w Reflex to ID Panel     Status: None   Collection Time: 05/19/24  6:05 AM   Specimen: BLOOD  Result Value Ref Range Status   Specimen Description   Final     BLOOD BLOOD LEFT HAND Performed at [facility], [address], [place], [place] [address]    Special Requests   Final    BOTTLES DRAWN AEROBIC AND ANAEROBIC Blood Culture adequate volume Performed at [facility], [address] [place], [place] [address]    Culture   Final    NO GROWTH 5 DAYS Performed at [facility], [address], [place], [place] [address]    Report Status 05/24/2024 FINAL  Final  Culture, blood (Routine X 2) w Reflex to ID Panel     Status: None   Collection Time: 05/19/24  2:41 PM   Specimen: BLOOD  Result Value Ref Range Status   Specimen Description   Final    BLOOD BLOOD RIGHT HAND Performed at [facility], [address], [place], [place] [address]    Special Requests   Final    BOTTLES DRAWN AEROBIC AND ANAEROBIC  Blood Culture results may not be optimal due to an inadequate volume of blood received in culture bottles Performed at [facility], [address] [place], [place] [address]    Culture   Final    NO GROWTH 5 DAYS Performed at [facility], [address], [place], [place] [address]    Report Status 05/24/2024 FINAL  Final     Labs: Basic Metabolic Panel: Recent Labs  Lab 05/21/24 0411  NA 134*  K 4.0  CL 104  CO2 22  GLUCOSE 112*  BUN 15  CREATININE 0.80  CALCIUM  8.0*   Liver Function Tests: Recent Labs  Lab 05/21/24 0411  AST 125*  ALT 69*  ALKPHOS 89  BILITOT 3.9*  PROT 7.9  ALBUMIN  3.4*   No results for input(s): LIPASE, AMYLASE in the last 168 hours. No results for input(s): AMMONIA in the last 168 hours. CBC: Recent Labs  Lab 05/22/24 1037 05/23/24 0500 05/24/24 0500 05/25/24 0435 05/26/24 0529  WBC 8.3 10.6* 9.4 8.4 7.3  HGB 7.4* 7.1* 7.0* 7.3* 7.4*  HCT 21.2* 21.1* 18.9* 19.5* 20.3*  MCV 92.2 95.0 92.6 90.7 92.3  PLT 349 374 459* 540* 595*   Cardiac Enzymes: No results for input(s): CKTOTAL, CKMB,  CKMBINDEX, TROPONINI in the last 168 hours. BNP: Invalid input(s): POCBNP CBG: No results for input(s): GLUCAP in the last 168 hours.  Time coordinating discharge: 50 minutes  Signed:  [name] [name] Cover NP   [facility] 05/26/2024, 10:57 AM

## 2024-05-26 NOTE — Progress Notes (Signed)
 Discharge instructions reviewed with patient, verbalized understanding. All questions answered. All belongings accounted for. Patient to follow up with MD in  1-2 weeks.

## 2024-05-26 NOTE — Discharge Instructions (Signed)
 SABRA

## 2024-06-06 ENCOUNTER — Other Ambulatory Visit: Payer: Self-pay

## 2024-06-12 ENCOUNTER — Ambulatory Visit: Payer: Self-pay | Admitting: Family

## 2024-06-12 ENCOUNTER — Ambulatory Visit: Payer: Self-pay

## 2024-08-07 ENCOUNTER — Ambulatory Visit: Payer: Self-pay | Admitting: Nurse Practitioner

## 8386-12-20 DEATH — deceased
